# Patient Record
Sex: Female | Born: 1935 | Race: White | Hispanic: No | Smoking: Never smoker
Health system: Southern US, Community
[De-identification: ages and names within clinical notes are randomized; demographics above are authoritative.]

## PROBLEM LIST (undated history)

## (undated) DIAGNOSIS — I639 Cerebral infarction, unspecified: Secondary | ICD-10-CM

## (undated) DIAGNOSIS — K219 Gastro-esophageal reflux disease without esophagitis: Secondary | ICD-10-CM

## (undated) DIAGNOSIS — Z992 Dependence on renal dialysis: Secondary | ICD-10-CM

## (undated) DIAGNOSIS — I499 Cardiac arrhythmia, unspecified: Secondary | ICD-10-CM

## (undated) DIAGNOSIS — I739 Peripheral vascular disease, unspecified: Secondary | ICD-10-CM

## (undated) DIAGNOSIS — N289 Disorder of kidney and ureter, unspecified: Secondary | ICD-10-CM

## (undated) DIAGNOSIS — J90 Pleural effusion, not elsewhere classified: Secondary | ICD-10-CM

## (undated) DIAGNOSIS — I10 Essential (primary) hypertension: Secondary | ICD-10-CM

## (undated) DIAGNOSIS — E1140 Type 2 diabetes mellitus with diabetic neuropathy, unspecified: Secondary | ICD-10-CM

## (undated) DIAGNOSIS — IMO0001 Reserved for inherently not codable concepts without codable children: Secondary | ICD-10-CM

## (undated) DIAGNOSIS — M1990 Unspecified osteoarthritis, unspecified site: Secondary | ICD-10-CM

## (undated) DIAGNOSIS — I509 Heart failure, unspecified: Secondary | ICD-10-CM

## (undated) DIAGNOSIS — G2581 Restless legs syndrome: Secondary | ICD-10-CM

## (undated) DIAGNOSIS — N189 Chronic kidney disease, unspecified: Secondary | ICD-10-CM

## (undated) DIAGNOSIS — I4891 Unspecified atrial fibrillation: Secondary | ICD-10-CM

## (undated) DIAGNOSIS — F419 Anxiety disorder, unspecified: Secondary | ICD-10-CM

## (undated) DIAGNOSIS — E119 Type 2 diabetes mellitus without complications: Secondary | ICD-10-CM

## (undated) DIAGNOSIS — D649 Anemia, unspecified: Secondary | ICD-10-CM

## (undated) DIAGNOSIS — I272 Pulmonary hypertension, unspecified: Secondary | ICD-10-CM

## (undated) HISTORY — PX: OTHER SURGICAL HISTORY: SHX169

## (undated) HISTORY — PX: CHOLECYSTECTOMY: SHX55

## (undated) HISTORY — PX: JOINT REPLACEMENT: SHX530

## (undated) HISTORY — PX: DILATION AND CURETTAGE OF UTERUS: SHX78

## (undated) HISTORY — PX: CATARACT EXTRACTION, BILATERAL: SHX1313

## (undated) HISTORY — PX: THYROIDECTOMY, PARTIAL: SHX18

## (undated) HISTORY — PX: FISTULAGRAM (ARMC HX): HXRAD1277

## (undated) HISTORY — PX: TONSILLECTOMY: SUR1361

## (undated) HISTORY — PX: EYE SURGERY: SHX253

## (undated) HISTORY — PX: PARATHYROIDECTOMY: SHX19

## (undated) HISTORY — PX: CARDIAC CATHETERIZATION: SHX172

## (undated) SURGERY — Surgical Case
Anesthesia: *Unknown

---

## 2004-05-05 ENCOUNTER — Encounter: Payer: Self-pay | Admitting: Podiatry

## 2004-06-01 ENCOUNTER — Encounter: Payer: Self-pay | Admitting: Podiatry

## 2004-07-02 ENCOUNTER — Encounter: Payer: Self-pay | Admitting: Podiatry

## 2005-08-13 ENCOUNTER — Ambulatory Visit: Payer: Self-pay | Admitting: Family Medicine

## 2005-10-07 ENCOUNTER — Ambulatory Visit: Payer: Self-pay | Admitting: Unknown Physician Specialty

## 2005-10-13 ENCOUNTER — Emergency Department: Payer: Self-pay | Admitting: Emergency Medicine

## 2006-04-09 ENCOUNTER — Other Ambulatory Visit: Payer: Self-pay

## 2006-04-10 ENCOUNTER — Inpatient Hospital Stay: Payer: Self-pay | Admitting: Internal Medicine

## 2006-10-15 ENCOUNTER — Ambulatory Visit: Payer: Self-pay | Admitting: Ophthalmology

## 2007-05-12 ENCOUNTER — Ambulatory Visit: Payer: Self-pay | Admitting: Family Medicine

## 2007-09-20 ENCOUNTER — Encounter: Payer: Self-pay | Admitting: Internal Medicine

## 2007-09-20 ENCOUNTER — Ambulatory Visit: Payer: Self-pay | Admitting: Internal Medicine

## 2007-09-30 ENCOUNTER — Ambulatory Visit: Payer: Self-pay | Admitting: Internal Medicine

## 2007-10-07 ENCOUNTER — Encounter: Payer: Self-pay | Admitting: Internal Medicine

## 2007-10-31 ENCOUNTER — Encounter: Payer: Self-pay | Admitting: Internal Medicine

## 2007-11-30 ENCOUNTER — Encounter: Payer: Self-pay | Admitting: Internal Medicine

## 2008-06-12 ENCOUNTER — Ambulatory Visit: Payer: Self-pay | Admitting: Family Medicine

## 2008-12-26 ENCOUNTER — Ambulatory Visit: Payer: Self-pay | Admitting: Ophthalmology

## 2009-01-08 ENCOUNTER — Ambulatory Visit: Payer: Self-pay | Admitting: Ophthalmology

## 2009-02-08 ENCOUNTER — Ambulatory Visit: Payer: Self-pay | Admitting: Ophthalmology

## 2009-02-18 ENCOUNTER — Ambulatory Visit: Payer: Self-pay | Admitting: Ophthalmology

## 2009-04-02 ENCOUNTER — Ambulatory Visit: Payer: Self-pay | Admitting: Family Medicine

## 2009-08-08 ENCOUNTER — Ambulatory Visit: Payer: Self-pay | Admitting: Family Medicine

## 2010-09-03 ENCOUNTER — Ambulatory Visit: Payer: Self-pay | Admitting: Family Medicine

## 2011-01-09 ENCOUNTER — Encounter: Payer: Self-pay | Admitting: Cardiothoracic Surgery

## 2011-01-09 ENCOUNTER — Encounter: Payer: Self-pay | Admitting: Nurse Practitioner

## 2011-01-31 ENCOUNTER — Encounter: Payer: Self-pay | Admitting: Cardiothoracic Surgery

## 2011-01-31 ENCOUNTER — Encounter: Payer: Self-pay | Admitting: Nurse Practitioner

## 2011-03-02 ENCOUNTER — Encounter: Payer: Self-pay | Admitting: Nurse Practitioner

## 2011-03-02 ENCOUNTER — Encounter: Payer: Self-pay | Admitting: Cardiothoracic Surgery

## 2011-06-08 ENCOUNTER — Encounter: Payer: Self-pay | Admitting: Rheumatology

## 2011-09-08 ENCOUNTER — Ambulatory Visit: Payer: Self-pay | Admitting: Family Medicine

## 2011-10-02 ENCOUNTER — Ambulatory Visit: Payer: Self-pay | Admitting: Family Medicine

## 2011-12-25 ENCOUNTER — Encounter: Payer: Self-pay | Admitting: Nurse Practitioner

## 2011-12-25 ENCOUNTER — Encounter: Payer: Self-pay | Admitting: Cardiothoracic Surgery

## 2011-12-31 ENCOUNTER — Encounter: Payer: Self-pay | Admitting: Nurse Practitioner

## 2011-12-31 ENCOUNTER — Encounter: Payer: Self-pay | Admitting: Cardiothoracic Surgery

## 2012-01-19 ENCOUNTER — Ambulatory Visit: Payer: Self-pay | Admitting: Nurse Practitioner

## 2012-01-31 ENCOUNTER — Ambulatory Visit: Payer: Self-pay | Admitting: Nurse Practitioner

## 2012-09-09 ENCOUNTER — Ambulatory Visit: Payer: Self-pay | Admitting: Family Medicine

## 2013-04-04 ENCOUNTER — Ambulatory Visit: Payer: Self-pay | Admitting: Vascular Surgery

## 2013-04-13 ENCOUNTER — Ambulatory Visit: Payer: Self-pay | Admitting: Vascular Surgery

## 2013-06-19 ENCOUNTER — Inpatient Hospital Stay: Payer: Self-pay | Admitting: Internal Medicine

## 2013-06-19 LAB — BASIC METABOLIC PANEL
ANION GAP: 3 — AB (ref 7–16)
BUN: 108 mg/dL — ABNORMAL HIGH (ref 7–18)
CHLORIDE: 86 mmol/L — AB (ref 98–107)
Calcium, Total: 10.4 mg/dL — ABNORMAL HIGH (ref 8.5–10.1)
Co2: 42 mmol/L (ref 21–32)
Creatinine: 4.52 mg/dL — ABNORMAL HIGH (ref 0.60–1.30)
EGFR (African American): 10 — ABNORMAL LOW
EGFR (Non-African Amer.): 9 — ABNORMAL LOW
Glucose: 147 mg/dL — ABNORMAL HIGH (ref 65–99)
OSMOLALITY: 299 (ref 275–301)
Potassium: 3.4 mmol/L — ABNORMAL LOW (ref 3.5–5.1)
Sodium: 131 mmol/L — ABNORMAL LOW (ref 136–145)

## 2013-06-19 LAB — CBC WITH DIFFERENTIAL/PLATELET
Basophil #: 0.1 10*3/uL (ref 0.0–0.1)
Basophil %: 0.9 %
Eosinophil #: 0.1 10*3/uL (ref 0.0–0.7)
Eosinophil %: 1.7 %
HCT: 36.8 % (ref 35.0–47.0)
HGB: 12.1 g/dL (ref 12.0–16.0)
Lymphocyte #: 1.3 10*3/uL (ref 1.0–3.6)
Lymphocyte %: 16.7 %
MCH: 28.1 pg (ref 26.0–34.0)
MCHC: 32.7 g/dL (ref 32.0–36.0)
MCV: 86 fL (ref 80–100)
MONO ABS: 0.9 x10 3/mm (ref 0.2–0.9)
Monocyte %: 11.6 %
Neutrophil #: 5.4 10*3/uL (ref 1.4–6.5)
Neutrophil %: 69.1 %
PLATELETS: 223 10*3/uL (ref 150–440)
RBC: 4.29 10*6/uL (ref 3.80–5.20)
RDW: 18 % — ABNORMAL HIGH (ref 11.5–14.5)
WBC: 7.7 10*3/uL (ref 3.6–11.0)

## 2013-06-19 LAB — PROTIME-INR
INR: 1.1
Prothrombin Time: 13.9 secs (ref 11.5–14.7)

## 2013-06-19 LAB — HEPATIC FUNCTION PANEL A (ARMC)
ALBUMIN: 3 g/dL — AB (ref 3.4–5.0)
ALT: 19 U/L (ref 12–78)
AST: 27 U/L (ref 15–37)
Alkaline Phosphatase: 60 U/L
BILIRUBIN TOTAL: 0.4 mg/dL (ref 0.2–1.0)
Bilirubin, Direct: 0.2 mg/dL (ref 0.00–0.20)
TOTAL PROTEIN: 7.3 g/dL (ref 6.4–8.2)

## 2013-06-20 LAB — CBC WITH DIFFERENTIAL/PLATELET
BASOS ABS: 0.1 10*3/uL (ref 0.0–0.1)
Basophil %: 0.5 %
Eosinophil #: 0.2 10*3/uL (ref 0.0–0.7)
Eosinophil %: 2.1 %
HCT: 35.6 % (ref 35.0–47.0)
HGB: 11.6 g/dL — ABNORMAL LOW (ref 12.0–16.0)
LYMPHS PCT: 22.7 %
Lymphocyte #: 2.3 10*3/uL (ref 1.0–3.6)
MCH: 28.2 pg (ref 26.0–34.0)
MCHC: 32.7 g/dL (ref 32.0–36.0)
MCV: 86 fL (ref 80–100)
MONO ABS: 1.1 x10 3/mm — AB (ref 0.2–0.9)
Monocyte %: 10.6 %
Neutrophil #: 6.5 10*3/uL (ref 1.4–6.5)
Neutrophil %: 64.1 %
PLATELETS: 240 10*3/uL (ref 150–440)
RBC: 4.14 10*6/uL (ref 3.80–5.20)
RDW: 17.6 % — ABNORMAL HIGH (ref 11.5–14.5)
WBC: 10.1 10*3/uL (ref 3.6–11.0)

## 2013-06-20 LAB — BASIC METABOLIC PANEL
ANION GAP: 5 — AB (ref 7–16)
BUN: 112 mg/dL — AB (ref 7–18)
CREATININE: 4.78 mg/dL — AB (ref 0.60–1.30)
Calcium, Total: 10.2 mg/dL — ABNORMAL HIGH (ref 8.5–10.1)
Chloride: 86 mmol/L — ABNORMAL LOW (ref 98–107)
Co2: 40 mmol/L (ref 21–32)
EGFR (African American): 9 — ABNORMAL LOW
EGFR (Non-African Amer.): 8 — ABNORMAL LOW
Glucose: 47 mg/dL — ABNORMAL LOW (ref 65–99)
Osmolality: 295 (ref 275–301)
Potassium: 3.1 mmol/L — ABNORMAL LOW (ref 3.5–5.1)
Sodium: 131 mmol/L — ABNORMAL LOW (ref 136–145)

## 2013-06-20 LAB — LIPID PANEL
CHOLESTEROL: 119 mg/dL (ref 0–200)
HDL Cholesterol: 57 mg/dL (ref 40–60)
LDL CHOLESTEROL, CALC: 43 mg/dL (ref 0–100)
Triglycerides: 95 mg/dL (ref 0–200)
VLDL Cholesterol, Calc: 19 mg/dL (ref 5–40)

## 2013-06-20 LAB — PHOSPHORUS: PHOSPHORUS: 4.1 mg/dL (ref 2.5–4.9)

## 2013-06-20 LAB — TSH: Thyroid Stimulating Horm: 2.68 u[IU]/mL

## 2013-06-20 LAB — HEMOGLOBIN A1C: Hemoglobin A1C: 8.4 % — ABNORMAL HIGH (ref 4.2–6.3)

## 2013-06-20 LAB — MAGNESIUM: Magnesium: 3.1 mg/dL — ABNORMAL HIGH

## 2013-06-21 LAB — BASIC METABOLIC PANEL
Anion Gap: 6 — ABNORMAL LOW (ref 7–16)
BUN: 83 mg/dL — AB (ref 7–18)
CHLORIDE: 91 mmol/L — AB (ref 98–107)
CREATININE: 3.46 mg/dL — AB (ref 0.60–1.30)
Calcium, Total: 9.7 mg/dL (ref 8.5–10.1)
Co2: 34 mmol/L — ABNORMAL HIGH (ref 21–32)
EGFR (Non-African Amer.): 12 — ABNORMAL LOW
GFR CALC AF AMER: 14 — AB
Glucose: 135 mg/dL — ABNORMAL HIGH (ref 65–99)
Osmolality: 290 (ref 275–301)
Potassium: 3.1 mmol/L — ABNORMAL LOW (ref 3.5–5.1)
Sodium: 131 mmol/L — ABNORMAL LOW (ref 136–145)

## 2013-06-21 LAB — PHOSPHORUS: Phosphorus: 3.7 mg/dL (ref 2.5–4.9)

## 2013-06-21 LAB — PROTIME-INR
INR: 1.1
Prothrombin Time: 14.1 secs (ref 11.5–14.7)

## 2013-06-22 LAB — PROTIME-INR
INR: 1.1
PROTHROMBIN TIME: 14.3 s (ref 11.5–14.7)

## 2013-06-22 LAB — BASIC METABOLIC PANEL
Anion Gap: 3 — ABNORMAL LOW (ref 7–16)
BUN: 55 mg/dL — ABNORMAL HIGH (ref 7–18)
CALCIUM: 8.9 mg/dL (ref 8.5–10.1)
CREATININE: 2.78 mg/dL — AB (ref 0.60–1.30)
Chloride: 94 mmol/L — ABNORMAL LOW (ref 98–107)
Co2: 35 mmol/L — ABNORMAL HIGH (ref 21–32)
EGFR (African American): 18 — ABNORMAL LOW
EGFR (Non-African Amer.): 16 — ABNORMAL LOW
GLUCOSE: 146 mg/dL — AB (ref 65–99)
Osmolality: 282 (ref 275–301)
POTASSIUM: 3.3 mmol/L — AB (ref 3.5–5.1)
SODIUM: 132 mmol/L — AB (ref 136–145)

## 2013-06-22 LAB — PHOSPHORUS: Phosphorus: 3.3 mg/dL (ref 2.5–4.9)

## 2013-06-23 LAB — BASIC METABOLIC PANEL
ANION GAP: 5 — AB (ref 7–16)
BUN: 46 mg/dL — ABNORMAL HIGH (ref 7–18)
CHLORIDE: 95 mmol/L — AB (ref 98–107)
CREATININE: 2.73 mg/dL — AB (ref 0.60–1.30)
Calcium, Total: 8.9 mg/dL (ref 8.5–10.1)
Co2: 33 mmol/L — ABNORMAL HIGH (ref 21–32)
EGFR (African American): 19 — ABNORMAL LOW
EGFR (Non-African Amer.): 16 — ABNORMAL LOW
GLUCOSE: 116 mg/dL — AB (ref 65–99)
OSMOLALITY: 279 (ref 275–301)
Potassium: 3.7 mmol/L (ref 3.5–5.1)
Sodium: 133 mmol/L — ABNORMAL LOW (ref 136–145)

## 2013-06-23 LAB — PROTIME-INR
INR: 1.2
Prothrombin Time: 15.2 secs — ABNORMAL HIGH (ref 11.5–14.7)

## 2013-06-24 LAB — HEMOGLOBIN: HGB: 10.2 g/dL — AB (ref 12.0–16.0)

## 2013-06-24 LAB — PROTIME-INR
INR: 1.3
Prothrombin Time: 16.1 secs — ABNORMAL HIGH (ref 11.5–14.7)

## 2013-06-24 LAB — PHOSPHORUS: Phosphorus: 4.1 mg/dL (ref 2.5–4.9)

## 2013-06-25 LAB — PROTIME-INR
INR: 1.3
PROTHROMBIN TIME: 16.4 s — AB (ref 11.5–14.7)

## 2013-06-26 LAB — PHOSPHORUS: PHOSPHORUS: 4.2 mg/dL (ref 2.5–4.9)

## 2013-06-26 LAB — HEMOGLOBIN: HGB: 10.9 g/dL — AB (ref 12.0–16.0)

## 2013-06-26 LAB — PROTIME-INR
INR: 1.5
Prothrombin Time: 18.2 secs — ABNORMAL HIGH (ref 11.5–14.7)

## 2013-07-24 ENCOUNTER — Ambulatory Visit: Payer: Self-pay | Admitting: Vascular Surgery

## 2013-07-24 LAB — PROTIME-INR
INR: 1.2
Prothrombin Time: 14.6 secs (ref 11.5–14.7)

## 2013-07-24 LAB — POTASSIUM: POTASSIUM: 3.6 mmol/L (ref 3.5–5.1)

## 2013-12-01 ENCOUNTER — Ambulatory Visit: Payer: Self-pay | Admitting: Internal Medicine

## 2013-12-01 ENCOUNTER — Inpatient Hospital Stay: Payer: Self-pay | Admitting: Internal Medicine

## 2013-12-01 LAB — COMPREHENSIVE METABOLIC PANEL
ALBUMIN: 3.2 g/dL — AB (ref 3.4–5.0)
ALK PHOS: 101 U/L
AST: 30 U/L (ref 15–37)
Anion Gap: 9 (ref 7–16)
BILIRUBIN TOTAL: 0.5 mg/dL (ref 0.2–1.0)
BUN: 22 mg/dL — ABNORMAL HIGH (ref 7–18)
CHLORIDE: 100 mmol/L (ref 98–107)
Calcium, Total: 9 mg/dL (ref 8.5–10.1)
Co2: 27 mmol/L (ref 21–32)
Creatinine: 1.76 mg/dL — ABNORMAL HIGH (ref 0.60–1.30)
EGFR (Non-African Amer.): 27 — ABNORMAL LOW
GFR CALC AF AMER: 32 — AB
GLUCOSE: 122 mg/dL — AB (ref 65–99)
Osmolality: 277 (ref 275–301)
POTASSIUM: 3.1 mmol/L — AB (ref 3.5–5.1)
SGPT (ALT): 18 U/L (ref 12–78)
Sodium: 136 mmol/L (ref 136–145)
TOTAL PROTEIN: 7.4 g/dL (ref 6.4–8.2)

## 2013-12-01 LAB — CBC
HCT: 42.5 % (ref 35.0–47.0)
HGB: 13.7 g/dL (ref 12.0–16.0)
MCH: 30.1 pg (ref 26.0–34.0)
MCHC: 32.3 g/dL (ref 32.0–36.0)
MCV: 93 fL (ref 80–100)
Platelet: 247 10*3/uL (ref 150–440)
RBC: 4.56 10*6/uL (ref 3.80–5.20)
RDW: 17.4 % — ABNORMAL HIGH (ref 11.5–14.5)
WBC: 9.1 10*3/uL (ref 3.6–11.0)

## 2013-12-01 LAB — TSH: THYROID STIMULATING HORM: 2.13 u[IU]/mL

## 2013-12-01 LAB — CK TOTAL AND CKMB (NOT AT ARMC)
CK, Total: 46 U/L
CK-MB: 2 ng/mL (ref 0.5–3.6)

## 2013-12-01 LAB — HEMOGLOBIN A1C: Hemoglobin A1C: 6.5 % — ABNORMAL HIGH (ref 4.2–6.3)

## 2013-12-01 LAB — TROPONIN I
Troponin-I: 0.03 ng/mL
Troponin-I: 0.02 ng/mL

## 2013-12-01 LAB — PROTIME-INR
INR: 1.6
Prothrombin Time: 19.1 secs — ABNORMAL HIGH (ref 11.5–14.7)

## 2013-12-02 LAB — RENAL FUNCTION PANEL
ALBUMIN: 3.1 g/dL — AB (ref 3.4–5.0)
Anion Gap: 9 (ref 7–16)
BUN: 18 mg/dL (ref 7–18)
CALCIUM: 9.1 mg/dL (ref 8.5–10.1)
CHLORIDE: 99 mmol/L (ref 98–107)
Co2: 29 mmol/L (ref 21–32)
Creatinine: 1.67 mg/dL — ABNORMAL HIGH (ref 0.60–1.30)
GFR CALC AF AMER: 34 — AB
GFR CALC NON AF AMER: 29 — AB
Glucose: 183 mg/dL — ABNORMAL HIGH (ref 65–99)
Osmolality: 280 (ref 275–301)
Phosphorus: 2.8 mg/dL (ref 2.5–4.9)
Potassium: 3.3 mmol/L — ABNORMAL LOW (ref 3.5–5.1)
Sodium: 137 mmol/L (ref 136–145)

## 2013-12-02 LAB — COMPREHENSIVE METABOLIC PANEL
ALK PHOS: 97 U/L
ANION GAP: 9 (ref 7–16)
Albumin: 3.2 g/dL — ABNORMAL LOW (ref 3.4–5.0)
BUN: 15 mg/dL (ref 7–18)
Bilirubin,Total: 0.6 mg/dL (ref 0.2–1.0)
Calcium, Total: 8.8 mg/dL (ref 8.5–10.1)
Chloride: 98 mmol/L (ref 98–107)
Co2: 31 mmol/L (ref 21–32)
Creatinine: 1.4 mg/dL — ABNORMAL HIGH (ref 0.60–1.30)
GFR CALC AF AMER: 42 — AB
GFR CALC NON AF AMER: 36 — AB
Glucose: 166 mg/dL — ABNORMAL HIGH (ref 65–99)
Osmolality: 280 (ref 275–301)
POTASSIUM: 3.1 mmol/L — AB (ref 3.5–5.1)
SGOT(AST): 29 U/L (ref 15–37)
SGPT (ALT): 17 U/L (ref 12–78)
Sodium: 138 mmol/L (ref 136–145)
Total Protein: 7 g/dL (ref 6.4–8.2)

## 2013-12-02 LAB — CBC WITH DIFFERENTIAL/PLATELET
BASOS PCT: 0.6 %
Basophil #: 0.1 10*3/uL (ref 0.0–0.1)
Eosinophil #: 0.1 10*3/uL (ref 0.0–0.7)
Eosinophil %: 1.5 %
HCT: 40.6 % (ref 35.0–47.0)
HGB: 13.5 g/dL (ref 12.0–16.0)
Lymphocyte #: 0.8 10*3/uL — ABNORMAL LOW (ref 1.0–3.6)
Lymphocyte %: 8.4 %
MCH: 30.8 pg (ref 26.0–34.0)
MCHC: 33.3 g/dL (ref 32.0–36.0)
MCV: 92 fL (ref 80–100)
Monocyte #: 0.9 x10 3/mm (ref 0.2–0.9)
Monocyte %: 9.4 %
NEUTROS ABS: 7.8 10*3/uL — AB (ref 1.4–6.5)
NEUTROS PCT: 80.1 %
PLATELETS: 212 10*3/uL (ref 150–440)
RBC: 4.4 10*6/uL (ref 3.80–5.20)
RDW: 17.3 % — ABNORMAL HIGH (ref 11.5–14.5)
WBC: 9.8 10*3/uL (ref 3.6–11.0)

## 2013-12-02 LAB — APTT: Activated PTT: 40.4 secs — ABNORMAL HIGH (ref 23.6–35.9)

## 2013-12-02 LAB — PROTIME-INR
INR: 1.7
PROTHROMBIN TIME: 19.7 s — AB (ref 11.5–14.7)

## 2013-12-02 LAB — TROPONIN I: TROPONIN-I: 0.03 ng/mL

## 2013-12-02 LAB — HEPARIN LEVEL (UNFRACTIONATED): Anti-Xa(Unfractionated): 0.14 IU/mL — ABNORMAL LOW (ref 0.30–0.70)

## 2013-12-03 LAB — CBC WITH DIFFERENTIAL/PLATELET
Basophil #: 0.1 10*3/uL (ref 0.0–0.1)
Basophil %: 0.7 %
Eosinophil #: 0.2 10*3/uL (ref 0.0–0.7)
Eosinophil %: 2 %
HCT: 42.7 % (ref 35.0–47.0)
HGB: 14.2 g/dL (ref 12.0–16.0)
Lymphocyte #: 1.1 10*3/uL (ref 1.0–3.6)
Lymphocyte %: 14.5 %
MCH: 31.2 pg (ref 26.0–34.0)
MCHC: 33.3 g/dL (ref 32.0–36.0)
MCV: 94 fL (ref 80–100)
MONO ABS: 0.9 x10 3/mm (ref 0.2–0.9)
Monocyte %: 11.1 %
NEUTROS ABS: 5.6 10*3/uL (ref 1.4–6.5)
Neutrophil %: 71.7 %
PLATELETS: 214 10*3/uL (ref 150–440)
RBC: 4.56 10*6/uL (ref 3.80–5.20)
RDW: 17.1 % — ABNORMAL HIGH (ref 11.5–14.5)
WBC: 7.9 10*3/uL (ref 3.6–11.0)

## 2013-12-03 LAB — PROTIME-INR
INR: 1.7
Prothrombin Time: 19.2 secs — ABNORMAL HIGH (ref 11.5–14.7)

## 2013-12-03 LAB — HEPARIN LEVEL (UNFRACTIONATED): ANTI-XA(UNFRACTIONATED): 0.35 [IU]/mL (ref 0.30–0.70)

## 2014-01-22 ENCOUNTER — Ambulatory Visit: Payer: Self-pay | Admitting: Vascular Surgery

## 2014-01-22 LAB — POTASSIUM: Potassium: 3.5 mmol/L (ref 3.5–5.1)

## 2014-01-22 LAB — PROTIME-INR
INR: 1.5
PROTHROMBIN TIME: 17.5 s — AB (ref 11.5–14.7)

## 2014-07-04 ENCOUNTER — Ambulatory Visit: Payer: Self-pay | Admitting: Vascular Surgery

## 2014-07-04 LAB — BASIC METABOLIC PANEL WITH GFR
Anion Gap: 8
BUN: 44 mg/dL — ABNORMAL HIGH
Calcium, Total: 8.9 mg/dL
Chloride: 100 mmol/L
Co2: 29 mmol/L
Creatinine: 2.72 mg/dL — ABNORMAL HIGH
EGFR (African American): 22 — ABNORMAL LOW
EGFR (Non-African Amer.): 18 — ABNORMAL LOW
Glucose: 76 mg/dL
Osmolality: 284
Potassium: 3.3 mmol/L — ABNORMAL LOW
Sodium: 137 mmol/L

## 2014-07-04 LAB — PROTIME-INR
INR: 1.8
Prothrombin Time: 21.2 s — ABNORMAL HIGH

## 2014-09-21 NOTE — Op Note (Signed)
PATIENT NAME:  [name], [name] MR#:  [id number] DATE OF BIRTH:  [birth year]  DATE OF PROCEDURE:  04/13/2013  PREOPERATIVE DIAGNOSES: 1.  Chronic kidney disease nearing dialysis dependence.  2.  Hypertension.  3.  Diabetes.   POSTOPERATIVE DIAGNOSES: 1.  Chronic kidney disease nearing dialysis dependence.  2.  Hypertension.  3.  Diabetes.  PROCEDURE:  Right brachiocephalic AV fistula creation.   SURGEON:  Annice NeedyJason S. Dew, M.D.   ANESTHESIA:  General.   ESTIMATED BLOOD LOSS:  25 mL.   INDICATION FOR PROCEDURE:  A 81 year old white female with chronic kidney disease.  This has progressed and she is now nearing dialysis dependence.  We are asked to see her for permanent dialysis access.  She had no usable superficial vein in her left upper extremity.  Her right cephalic vein in the upper arm was adequate for fistula creation by duplex and she is brought in for fistula creation at this location.  Risks and benefits were discussed.  Informed consent is obtained.   DESCRIPTION OF PROCEDURE:  The patient is brought to the operative suite and after an adequate level of general anesthesia is obtained, the right upper extremity is sterilely prepped and draped and a sterile surgical field was created.  A curvilinear incision was created at the antecubital fossa.  We dissected down finding a moderate-sized and patent cephalic vein that appeared usable for fistula creation.  This dissected out, marked for orientation, ligated distally and controlled with a bulldog clamp.  The artery was dissected out and encircled with vessel loops proximally and distally.  The patient was systemically heparinized with 3000 units of intravenous heparin.  An anterior wall arteriotomy was created with an 11 blade and extended with Potts scissors and then the vein was cut and beveled to an appropriate length to match the arteriotomy.  The vein was flushed.  I was able to pass a 4 dilator without difficulty through the vein and I  then created an anastomosis with a running 6-0 Prolene suture in the usual fashion, two 6-0 Prolene patch sutures were used for hemostasis and hemostasis was complete.  The vessel had been flushed and de-aired prior to releasing control.  On release, there was some mild vasospasm treated with topical papaverine.  After this, there was a nice thrill within the AV fistula and a palpable pulse in the brachial artery distal to the anastomosis.  At this point I elected to terminate the procedure.  Surgicel was placed.  The wound had been irrigated.  The wound was closed with 3-0 Vicryl and a 4-0 Monocryl.  Dermabond was placed as a dressing.  The patient tolerated the procedure well and was taken to the recovery room in stable condition.    ____________________________ Annice NeedyJason S. Dew, MD jsd:ea D: 04/13/2013 14:07:10 ET T: 04/13/2013 23:11:02 ET JOB#: [id number]  cc: Annice NeedyJason S. Dew, MD, <Dictator> Annice NeedyJASON S DEW MD ELECTRONICALLY SIGNED 04/17/2013 12:17

## 2014-09-22 NOTE — H&P (Signed)
PATIENT NAME:  [name], [name] MR#:  [id number] DATE OF BIRTH:  [birth year]  DATE OF ADMISSION:  06/19/2013  REFERRING PHYSICIAN:  Dr. Wynelle Link.   PRIMARY CARE PHYSICIAN:  Dr. Gavin Potters.   CHIEF COMPLAINT:  Uremia, shakes, tremors.   HISTORY OF PRESENT ILLNESS:  The patient is a pleasant 81 year old female with CKD on high-dose Lasix and metolazone, diabetes, hypertension, CHF diastolic in nature. She was asked to be admitted by Dr. Wynelle Link for having increased weakness, lethargy, and uremic symptoms with tremors. She is itchy and has no energy. Of note, her BUN is getting close to 100, and it was lower previously with a creatinine of close to 4. Of note, she has an AV fistula in place from November. She has no chest pain or shortness of breath.   PAST MEDICAL HISTORY:  History of chronic afib/flutter on warfarin, hypertension, gout, diabetes with neuropathy, hyperlipidemia, depression overactive bladder, incontinence, anxiety, diastolic CHF, parathyroidectomy in 2007, cataracts, osteoarthritis, history of stroke with right visual field blindness, lung nodules, history of a right arm AV fistula.   SOCIAL HISTORY:  Lives by herself. Poor vision. No tobacco, but had extensive secondhand tobacco from her husband. No alcohol.   FAMILY HISTORY:  Kidney disease, heart disease, and cancer.   ALLERGIES:  OXYCODONE and QUININE.   OUTPATIENT MEDICATIONS:  Albuterol 90 mcg 1 puff every 4 hours as needed, allopurinol 100 mg daily, aspirin 81 mg daily, calcium carbonate 600 mg once a day, carvedilol 6.25 mg 2 times a day, docusate sodium 2 times a day 100 mg dose, furosemide 80 mg 2 times a day, gabapentin 300 mg daily, sliding scale insulin, hydralazine 50 mg 2 times a day, Lantus 21 units daily, metolazone 5 mg in the morning with Lasix, oxybutynin 10 mg daily, paroxetine 40 mg daily, Actos 30 mg daily, warfarin 3 mg half a tab once a day.   REVIEW OF SYSTEMS: CONSTITUTIONAL:  Denies fevers or weight gain or  loss, but has fatigue, weakness, and poor appetite.  EYES:  Decreased right vision after stroke.  ENT:  No tinnitus or hearing loss.  RESPIRATORY:  No cough, wheezing, shortness of breath.  CARDIOVASCULAR:  No chest pain. Has a history of afib and flutter with diastolic CHF with chronic effusions.  ABDOMEN AND GASTROINTESTINAL:  Positive for constipation. No diarrhea. No abdominal pain, hematemesis, or black or tarry stools.  GENITOURINARY:  Denies dysuria or hematuria. She still does pee.  ENDOCRINE:  Has a history of parathyroid issues.  SKIN:  No obvious rashes.  NEUROLOGIC:  No focal weakness. Has global weakness.  PSYCHIATRIC:  Has depression.   PHYSICAL EXAMINATION:  VITAL SIGNS:  Temperature 97.8, pulse rate 78, respiratory 16, blood pressure 129/72, O2 sat 94% on room air.  GENERAL:  The patient is an obese female lying in bed. No obvious distress.  HEENT:  Normocephalic, atraumatic. Pupils appear to be equal. Dry mucous membranes.  NECK:  Supple. No thyroid tenderness. No cervical lymphadenopathy.  CARDIOVASCULAR:  S1, S2. Regular to me.  LUNGS:  Decreased breath sounds at the bases. Good air entry at the upper lobes, No significant wheezing.  ABDOMEN:  Soft, nontender, nondistended. Positive bowel sounds in all quadrants. No organomegaly could be appreciated.  EXTREMITIES:  The patient has 2+ pitting edema to mid shins and some chronic venous stasis changes.  SKIN:  Otherwise, no other rashes or lesions. NEUROLOGIC:  Cranial nerves II through XII grossly intact. Strength is 5/5 in all extremities. Do not appreciate any  significant tremors.  PSYCHIATRIC:  Awake, alert, and oriented x3.   LABORATORIES:  No recent labs here, but from the office from several days ago: White count of 9.1, hemoglobin of 12.1. BUN of 72, creatinine is 3.4. Both of those might be older; as per Dr. Wynelle LinkKolluru, the BUN is closer to 100 or higher with creatinine close to 4. Last INR was 1.3 from late last week  per daughter, who is in the room,   ASSESSMENT AND PLAN:  We have a 81 year old with chronic kidney disease, diabetes, hypertension, chronic diastolic congestive heart failure, who was asked to be admitted for initiation of dialysis for increased uremia or worsening renal failure. The patient will be admitted at this point. We will obtain baseline labs including CBC, BMP, hepatic function, and INR. I did discuss the case with vascular to see if they could place a line for dialysis, but he recommended starting a trial with the fistula. I have also discussed the case with nephrology. We will see if she needs a dialysis catheter or the fistula could be used. I hear a good bruit on the fistula. I will continue the blood pressure medications, continue the Lantus, hold Actos at this point. I will continue the furosemide, hold the Zaroxolyn at this point, continue the aspirin. For deep vein thrombosis prophylaxis, she is on Coumadin. I will check an INR at this time. I will check a baseline x-ray of the chest.   TOTAL TIME SPENT:  45 minutes.    ____________________________ Krystal EatonShayiq Dairon Procter, MD sa:ms D: 06/19/2013 16:46:51 ET T: 06/19/2013 18:02:53 ET JOB#: [id number]  cc: Krystal EatonShayiq Brailee Riede, MD, <Dictator> Krystal EatonSHAYIQ Kraig Genis MD ELECTRONICALLY SIGNED 07/15/2013 10:53

## 2014-09-22 NOTE — Discharge Summary (Signed)
PATIENT NAME:  [name], [name] MR#:  [id number] DATE OF BIRTH:  August 07, 1933  DATE OF ADMISSION:  06/19/2013 DATE OF DISCHARGE:  06/26/2013  ADMISSION DIAGNOSIS: Acute on chronic renal failure.   DISCHARGE DIAGNOSES: 1. End-stage renal disease now on hemodialysis.  2. Hypertension.  3. History of atrial fibrillation and flutter.  4. Chronic diastolic heart failure. 5. Diabetes.  6. Hyperlipidemia.   CONSULTATIONS: Dr. Cherylann RatelLateef.   LABORATORIES AT DISCHARGE: INR is 1.5.   HOSPITAL COURSE: This is a very pleasant 81 year old female with a history of chronic kidney disease, who presented with uremia, shakes and tremors. For further details, please refer to the H and P.  1. Uremia with end-stage renal disease. The patient has chronic renal failure. She had uremic symptoms on admission. She was transitioned to dialysis. She is now end-stage renal dialysis. She has had more than three dialysis here and is stable for discharge to rehab, and she will continue her Monday, Wednesday, Friday dialysis.  2. Hypertension. The patient's blood pressure is stable.  3. History of atrial flutter and atrial fibrillation. The patient is on Cardizem 300 and Coreg. Her heart rates are controlled. She is also on Coumadin. Her INR is 1.5 mg at discharge. She will need to continue on her Coumadin with weekly Coumadin checks.  3. Chronic diastolic heart failure.She did not appear to be in congestive heart failure. She is on Lasix and a beta blocker.  4. Diabetes. The patient will continue on her outpatient medications.  5. Hyperlipidemia, on Pravachol.   DISCHARGE MEDICATIONS: 1. Multivitamin 1 tablet daily.  2. Fish oil 1200 mg daily.  3. Calcium plus vitamin D 1 tablet daily.  4. Pravastatin 40 mg at bedtime.  5. Paxil 40 mg daily.  6. Aspirin 81 mg daily.  7. Oxybutynin 10 mg daily.  8. Diltiazem 240 mg daily.  9. Coreg 6.25 b.i.d.  10. Allopurinol 50 mg daily.  11. Senokot 2 tablets b.i.d.  12.  Acetaminophen 650 2 tablets b.i.d.  13. Lantus 21 units in the morning.  14. Humalog sliding scale.  15. Gabapentin 300 mg at bedtime.  16. Hydralazine 50 mg b.i.d.  17. Coumadin 1 mg every other day, that is Monday, Wednesday and Friday and 2 mg on the other days.  18. Sliding scale insulin as directed on discharge instructions. 19.  Lasix 80 mg b.i.d.   DISCHARGE DIET: Renal diet.   DISCHARGE ACTIVITY: As tolerated.   DISCHARGE FOLLOW-UP: The patient will follow up with Dr. Wynelle LinkKolluru in one week, as well as Dr. Rolm GalaHeidi Grandis. The patient needs weekly INR checks keeping INR between 2 and 3.  TIME SPENT: 37 minutes.   ____________________________ Janyth ContesSital P. Juliene PinaMody, MD spm:sg D: 06/26/2013 12:34:24 ET T: 06/26/2013 12:59:52 ET JOB#: [id number]  cc: Laquetta Racey P. Juliene PinaMody, MD, <Dictator> Letitia CaulHeidi M. Grandis, MD Laverda SorensonSarath C. Kolluru, MD  Janyth ContesSITAL P Kenna Kirn MD ELECTRONICALLY SIGNED 06/26/2013 14:06

## 2014-09-22 NOTE — Op Note (Signed)
PATIENT NAME:  [name], Ceirra Y MR#:  [id number] DATE OF BIRTH:  [birth year]  DATE OF PROCEDURE:  07/24/2013  PREOPERATIVE DIAGNOSES: 1.  End-stage renal disease.  2.  Poorly functioning right arm arteriovenous fistula.  3.  Hypertension.  4.  Diabetes.   POSTOPERATIVE DIAGNOSES:  1.  End-stage renal disease.  2.  Poorly functioning right arm arteriovenous fistula.  3.  Hypertension.  4.  Diabetes.   PROCEDURES: 1.  Ultrasound guidance for vascular access to right brachiocephalic arteriovenous fistula.  2.  Right upper extremity fistulogram and central venogram.  3.  Percutaneous transluminal angioplasty of mid upper arm cephalic vein with 7 and 8 mm diameter angioplasty balloons.   SURGEON: Annice NeedyJason S. Dew, M.D.   ANESTHESIA: Local with moderate conscious sedation.   ESTIMATED BLOOD LOSS: Minimal.   INDICATION FOR PROCEDURE: This is a 81 year old female with end-stage renal disease. Her AV fistula has developed significant bruising and diminished function on dialysis. We are asked to evaluate this. Risks and benefits were discussed and informed consent is obtained.   DESCRIPTION OF PROCEDURE: The patient is brought to the vascular suite. The right extremity was sterilely prepped and draped and a sterile surgical field was created. Ultrasound was used to visualize the fistula. This was accessed just a couple centimeters beyond the anastomosis, in the cephalic vein, with a micropuncture needle. Micropuncture wire and sheath were then placed and a permanent image was recorded. I upsized to a 6-French sheath and imaging was performed. This showed a high-grade stenosis in the mid upper arm cephalic vein that would be estimated in the 80% to 85% range. There were significant collaterals around this area. The remainder of the fistula was patent with mild stenosis at the cephalic vein, subclavian vein junction of less than 30%. The central venous circulation was patent. I then crossed the lesion  without difficulty with a Magic torque wire. I gave the patient 3,000 units of intravenous heparin and performed percutaneous transluminal angioplasty initially with a 7 mm diameter angioplasty balloon. With the balloon inflated, imaging was performed which showed the anastomosis which was widely patent. Following angioplasty with a 7 mm balloon there was still what appeared to be stenosis still in the 50% range. I took an 8 mm diameter angioplasty balloon to 16 atmospheres and completion angiogram following this showed markedly improved flow with about a 20 to 25% residual stenosis. At this point, I elected to terminate the procedure. The sheath was removed. A 4-0 Monocryl pursestring suture was placed. Pressure was held. Sterile dressing was placed. The patient tolerated the procedure well and was taken to the recovery room in stable condition.  ____________________________ Annice NeedyJason S. Dew, MD jsd:sb D: 07/24/2013 14:55:50 ET T: 07/24/2013 15:12:24 ET JOB#: [id number]  cc: Annice NeedyJason S. Dew, MD, <Dictator> Letitia CaulHeidi M. Grandis, MD Munsoor Lizabeth LeydenN. Lateef, MD / Mosetta PigeonHarmeet Singh, MD / Lamont DowdySarath Kolluru, MD Annice NeedyJASON S DEW MD ELECTRONICALLY SIGNED 07/27/2013 12:02

## 2014-09-22 NOTE — H&P (Signed)
PATIENT NAME:  [name], [name] MR#:  [id number] DATE OF BIRTH:  May 16, 1934  DATE OF ADMISSION:  12/01/2013  PRIMARY CARE PHYSICIAN: Letitia Caul, MD  HISTORY OF PRESENT ILLNESS: The patient  is a 81 year old, African American female with past medical history significant for history of end-stage renal disease, initiation January 2015 via right AV fistula, who presents to the hospital with complaints of significant shortness of breath. According to the patient she has been having shortness of breath over the past 1 week, worsening over the past 2 days. Her O2 saturations were 88 to 90% on room air. She was noted to have bilateral pleural effusions, more on the left side, on chest x-ray in the Emergency Room. Apparently, she has been losing weight and  her dry weight is adjusted all the time. Now, her dry weight is 84 kg. She had hemodialysis today, but she was so tired and she was so short of breath that as soon as she came to home, she went to her room and hooked up to oxygen. Usually she takes oxygen just at night, but yesterday she had to use it all day long. Today in the morning she was seen by her daughter who felt that patient was struggling and brought her to the Emergency Room for further evaluation. In the Emergency Room as mentioned above she had an x-ray, which revealed bilateral pleural effusions and hospitalist services were contacted for admission.   PAST MEDICAL HISTORY:  1. Significant for history of end-stage renal disease, initiated in January 2015, now on Tuesdays, Thursdays, Saturdays hemodialysis.  2. Hypertension.  3. Atrial fibrillation.  4.   Chronic diastolic CHF. 5. Diabetes mellitus.  6. Hyperlipidemia.  7. Chronic respiratory failure, on 2 liters of oxygen through nasal cannula at night.  8. History of gout.  9. Diabetes with neuropathy.  10. Depression.  11. Overactive bladder.  12. Incontinence.  13. Anxiety.  14. Status post parathyroidectomy in  2007. 15. Cataracts.  16. Osteoarthritis.  17. History of stroke with right-sided visual field blindness.  18. Lung nodules.  19. History of right arm AV fistula.   SOCIAL HISTORY: Lives by herself, poor vision. No tobacco. Has extensive secondhand tobacco from her husband. No alcohol abuse per medical history.   FAMILY HISTORY: Kidney disease, heart disease as well as cancer.  ALLERGIES: OXYCODONE AS WELL AS QUININE  OUTPATIENT MEDICATIONS: According to medical records, the patient is on;  1. Albuterol 1 puff every 4 hours as needed.  2. Allopurinol 100 mg p.o. daily.  3. Aspirin 81 mg p.o. daily.  4. Calcium carbonate  600 mg once daily.  5. Carvedilol 3.125 mg p.o. twice daily.  6. Coumadin 2 mg p.o. daily.  7. Docusate sodium 100 mg p.o. twice daily.  8. Fish oil 200 mg p.o. once daily.  9. Furosemide 20 mg p.o. twice daily.  10. Gabapentin 300  mg p.o. twice daily.  11. Humalog KwikPen sliding scale.  12. Levemir 21 units subcutaneously in the morning.  13. Magnesium hydroxide 400 mg p.o. once daily.  14. Oxybutynin extended-release 10 mg p.o. daily.  15. Paroxetine 40 mg p.o. daily.  16. Pioglitazone 15 mg p.o. daily.    REVIEW OF SYSTEMS:  CONSTITUTIONAL: Positive for weight loss from 265 to 180s in 1-1/2 years. Admits of good p.o. intake; however, still losing weight. Admits of significant weakness, which is chronic.  Denies any significant fevers or chills, fatigue, weight gain EYES: Also, intermittent headache, bifocal glasses.  RESPIRATORY:  Some cough but no significant phlegm production. Shortness of breath, worsening over the past 1 week. Intermittent palpitations in the chest as well as feeling presyncopal.  Denies any wheezes, asthma, chronic obstructive pulmonary disease.   GASTROINTESTINAL: Frequent constipation. No diarrhea, rectal bleeding, change in bowel habits, admits of constipation.  GENITOURINARY: Also urinary incontinence, which seems to be improved now  since she had pacer placed for bladder in her hip area. Does admit having some dysuria symptoms. Denies any hematuria, frequency.  ENT: Denies blurred vision, double vision, glaucoma, and cataracts. No allergies, sinus pain, dentures, difficulty swallowing. She had sleep study in the past, which was normal. CARDIOVASCULAR: Denies chest pains, orthopnea, edema. Admits of palpitations, as well as and feeling presyncopal earlier.  ENDOCRINOLOGY: Denies any polydipsia, nocturia, thyroid problems, or cold intolerance or thirst.  HEMATOLOGIC: Denies anemia, easy bruising, bleeding. SKIN: Denies any acne, rashes, lesions or change in moles.  MUSCULOSKELETAL: Denies arthritis, cramps, swelling.  NEUROLOGIC: There is no numbness, epilepsy or tremor.  PSYCHIATRIC: Denies anxiety, insomnia.  PHYSICAL EXAMINATION: On arrival to the hospital: VITAL SIGNS: Temperature 97.8, pulse is 73, respirations 20, blood pressure 135/90. Oxygen saturation was 88% to 90% on room air.  GENERAL: This is well-developed, well-nourished African American female in no significant distress, sitting on the stretcher. At present as she has oxygen via nasal cannula. HEENT: Her pupils are equal and reactive to light.  Extraocular muscles intact, no icterus or conjunctivitis. Has normal hearing. No pharyngeal erythema. Mucosa is moist. NECK: No masses. Supple, nontender. Thyroid is not enlarged. No adenopathy. No JVD or carotid bruits bilaterally. Full range of motion.  LUNGS: Clear to auscultation, a few crackles were heard at bases. No rhonchi or wheezing. No labored inspirations, increased effort, dullness to percussion or overt respiratory distress.   CARDIOVASCULAR: S1, S2 appreciated. No murmurs or gallops or rubs. The patient has irregularly irregular heart rhythm, no lateralization on palpation.   CHEST: Nontender to palpation.  EXTREMITIES:   trace lower extremity edema. No calf tenderness or cyanosis was noted.  ABDOMEN: Soft,  nontender. Bowel sounds are present. No hepatosplenomegaly or masses were noted.  RECTAL: Deferred.  MUSCLE STRENGTH: Able to move all extremities. No cyanosis, degenerative joint disease or kyphosis. Gait was not tested.  SKIN: No  rashes, lesions, erythema, nodularity or induration. It was warm and dry to palpation.  LYMPHATIC: No adenopathy in the cervical region.  NEUROLOGICAL: Cranial nerves grossly intact. Sensory intact. No dysarthria or aphasia. The patient is alert and oriented to person and place, cooperative. Memory is good.  No significant confusion, agitation or depression noted.   EKG showed atrial fibrillation at 63 beats per minute, left axis deviation and right bundle branch block. In comparison to prior EKG, no significant change since prior EKG done November 2014.   LABORATORY STUDIES:  BMP: Glucose 122, BUN and creatinine 22 and 1.76, potassium 3.1, otherwise BMP unremarkable. Albumin level of 3.2. Cardiac enzymes first set negative. CBC: White blood cell count 9.1, hemoglobin 13.7, platelet count 247,000. Coagulation panel: Pro time was 19.1, INR 1.6.   RADIOLOGIC STUDIES: Chest x-ray PA and lateral, December 01, 2013,  revealed bilateral effusions with associated atelectasis, worse on the left than on the right. Findings are progressive since January 2015.  ASSESSMENT AND PLAN: 1. Bilateral pleural effusions, possibly related to end-stage renal disease, fluid overload, congestive heart failure and possibly even poorly-adjusted dry weight. I did admit the patient to the medical floor. Get thoracentesis if no effect with  lasix intravenously.  2. End-stage renal disease. Nephrology consultation will be obtained.  3. Weight loss. Get TSH, hemoglobin A1c and follow p.o. intake.  4. Hypokalemia: Supplement p.o.  5. Atrial fibrillation. Had supratherapeutic Coumadin level. We will advance Coumadin dosing. Will be following patient's pro time daily.   TIME SPENT 50 minutes    ____________________________ Katharina Caperima Alicia Ackert, MD rv:dw D: 12/01/2013 14:51:55 ET T: 12/01/2013 17:21:45 ET JOB#: [id number]  cc: Katharina Caperima Rishard Delange, MD, <Dictator> Letitia CaulHeidi M. Grandis, MD Lameeka Schleifer MD ELECTRONICALLY SIGNED 12/21/2013 15:25

## 2014-09-22 NOTE — Discharge Summary (Signed)
PATIENT NAME:  [name], Blue Y MR#:  [id number] DATE OF BIRTH:  1933-08-03  DATE OF ADMISSION:  12/01/2013 DATE OF DISCHARGE:  12/03/2013  PRIMARY CARE PHYSICIAN: Dr. Rolm GalaHeidi Grandis.  FINAL DIAGNOSES:  1. Acute respiratory failure, which resolved.  2. Acute diastolic congestive heart failure with severe mitral regurgitation, pleural effusion, left greater than right.  3. End-stage renal disease on hemodialysis.  4. Atrial fibrillation.  5. Weight loss.  6. Hypertension.   MEDICATIONS ON DISCHARGE: Include fish oil 1200 mg daily, Paxil 40 mg at bedtime, oxybutynin 10 mg extended-release daily, aspirin 81 mg daily, Colace 100 mg 2 tablets twice a day, gabapentin 300 mg 1 capsule twice a day, Ventolin HFA 2 puffs 4 times a day as needed for wheezing, allopurinol 100 mg half tablet daily, furosemide 80 mg twice a day, acetaminophen extended-release 650 mg 2 tablets twice a day, Dialyvite with zinc 1 tablet daily, Cardizem CD 240 mg extended-release daily, warfarin 2 mg 1 tablet in the evening on Monday, Wednesday, and Friday, 1-1/2 tablets Tuesday, Thursday, Saturday, and Sunday. Tonight I want her to give 5 mg and tomorrow 3 mg, then on the regular schedule after that, Levemir FlexPen 10 units subcutaneous injection at bedtime.  OXYGEN: None.   DIET: Renal diet, regular consistency.   ACTIVITY: As tolerated.   FOLLOWUP: Follow up with dialysis on Tuesday, Thursday, and Saturday, in 1 to 2 weeks with Dr. Rolm GalaHeidi Grandis.   HOSPITAL COURSE: The patient was admitted 12/01/2013, discharged 12/03/2013. Came in with shortness of breath was found to have bilateral pleural effusions, fluid overload likely congestive heart failure. The patient was started on IV Lasix and dialysis was done on July 3rd and 4th.   LABORATORY AND RADIOLOGICAL DATA DURING THE HOSPITAL COURSE: Included an initial chest x-ray that showed bilateral effusions associated with atelectasis, worse on the left than the right,  progressive since 06/19/2013, EKG showed atrial fibrillation, left axis deviation, right bundle branch block.   Hemoglobin A1c is 6.5. TSH 2.13, INR 1.6. Troponin negative. White blood cell count 9.1, H and H 13.7 and 42.5, platelet count of 247,000, glucose 122, BUN 22, creatinine 1.76, sodium 136, potassium 3.1, chloride 100, CO2 of 27. Liver function tests normal range. Two troponins negative.   Echocardiogram showed an EF of 55% to 60%, severely dilated left and right atrium, large pleural effusion, severe tricuspid regurgitation, severe mitral regurgitation, severely increased left ventricular posterior wall thickness, moderately elevated pulmonary arterial systolic pressure.   INR upon discharge 1.7, hemoglobin 14.2.   Chest x-ray showed cardiomegaly with pulmonary vascular congestion. No frank interstitial edema which had improved. Small-to-moderate left and small right pleural effusions, mildly improved.   HOSPITAL COURSE PER PROBLEM LIST:  1. For the patient's acute respiratory failure, initially required oxygen supplementation during the hospital course, now currently pulse oximetry is 93% on room air.  2. Acute diastolic congestive heart failure with severe mitral regurgitation, pleural effusions, left greater than right. The patient had 2 dialysis sessions on July 3rd and July 4th. She was on Lasix 40 mg IV q. 8 hours for 2-1/2 days, I was about to set up a thoracentesis for Monday on her left pleural effusion, but she came off oxygen and she was moving better air and had a decrease in the effusion on repeat chest x-ray on the day of discharge, so since the patient's clinical status improved with fluid removal with dialysis and IV Lasix, I decided not to do an invasive procedure and I discharge  the patient home. The patient is not on a beta blocker secondary to atrial fibrillation being rate controlled with Cardizem. Dialysis will help out with fluid management. Her new dry weight is 77 kg.  She is on Lasix 80 mg twice a day on nondialysis days, and she takes Lasix 80 mg in the p.m., after dialysis.  3. End-stage renal disease on hemodialysis. She had hemodialysis on July 3rd and 4th and will go back on her usual schedule Tuesday, Thursday, Saturday.  4. Atrial fibrillation, rate controlled with Cardizem, anticoagulation with Coumadin. I actually held her Coumadin for 1 day and started her on heparin drip thinking I was going to do a thoracentesis. Her INR upon discharge was 1.7. I advised her to increase it to 5 mg today, 3 mg tomorrow, and back on her usual schedule of Coumadin after that.  5. Weight loss. The patient has lost weight so her dry weight needs to be recalculated every so often because she did gain weight with regards to fluid.  6. Hypertension. Blood pressure is stable upon discharge 131/75.  TIME SPENT ON DISCHARGE: 35 minutes.     ____________________________ Herschell Dimes. Renae Gloss, MD rjw:lt D: 12/03/2013 14:20:11 ET T: 12/04/2013 01:32:15 ET JOB#: [id number]  cc: Herschell Dimes. Renae Gloss, MD, <Dictator> Letitia Caul, MD Laverda Sorenson, MD [facility]   Salley Scarlet MD ELECTRONICALLY SIGNED 12/04/2013 16:25

## 2014-09-22 NOTE — Op Note (Signed)
PATIENT NAME:  [name], [name] MR#:  [id number] DATE OF BIRTH:  Sep 26, 1933  DATE OF PROCEDURE:  01/22/2014  PREOPERATIVE DIAGNOSES:  1.  End-stage renal disease.  2.  Poorly functioning right arm arteriovenous fistula with large volume infiltration and bruising.  3.  Hypertension.  4.  Diabetes.   POSTOPERATIVE DIAGNOSES:  1.  End-stage renal disease.  2.  Poorly functioning right arm arteriovenous fistula with large volume infiltration and bruising.  3.  Hypertension.  4.  Diabetes.   PROCEDURES: 1.  Ultrasound guidance for vascular access to right brachiocephalic arteriovenous fistula.  2.  Right upper extremity fistulogram and central venogram.  3.  Percutaneous transluminal angioplasty of right cephalic vein/subclavian vein confluence with 8 mm diameter high-pressure angioplasty balloon.   SURGEON: Annice NeedyJason S Dew, MD   ANESTHESIA: Local with moderate conscious sedation.   ESTIMATED BLOOD LOSS: Minimal.   FLUOROSCOPY TIME: Approximately 2 minutes and 20 mL of  contrast were used.   INDICATION FOR PROCEDURE: A 81 year old white female with end-stage renal disease. Her dialysis access had been working reasonably well until about a week ago when she developed high pressures and a significant infiltration in her right upper extremity. She is brought in for a fistulogram for further evaluation and potential treatment. Risks and benefits were discussed. Informed consent was obtained.   DESCRIPTION OF PROCEDURE: The patient is brought to the vascular suite. The right upper extremity was sterilely prepped and draped and a sterile surgical field was created. The fistula was accessed not far from the anastomosis under direct ultrasound guidance and a patent portion of the fistula and a permanent image was recorded. A micropuncture wire and then sheath were   and then we upsized to a 6 [place] sheath. The patient was given 2500 units of intravenous heparin for systemic anticoagulation. Imaging was  then performed through the sheath. This demonstrated throughout the cephalic vein until the cephalic vein and subclavian vein confluence, the vein was widely patent. There was some significant tortuosity around the shoulder. At the cephalic vein and subclavian vein confluence, there was vein valve hyperplasia with about a 60% to 70% stenosis. The remainder of the central venous circulation was patent. I crossed this lesion without difficulty with a Magic torque wire and I selected an 8 mm diameter x 6 cm length high-pressure angioplasty balloon. This was inflated, it went until 24 atmospheres to completely resolve the narrowing in the vein, but at this point, the waist relieved and the inflation was held for 1 minute. With the balloon inflated, I performed imaging to try to opacify the arterial anastomosis, but this was not well seen due to an aneurysmal portion of the fistula near the access site, which likely correlated with the arterial access site and a branch that provided some outflow. The balloon was deflated, following and removed and completion angiogram was then performed. This showed brisk flow through this area. There was a mild residual stenosis that appeared to be in the 30% range but not flow limiting. At this point, I elected to terminate the procedure. The sheath was removed, 4-0 Monocryl pursestring suture was placed. Pressure was held. Sterile dressing was placed. The patient tolerated the procedure well and was taken to the recovery room in stable condition.    ____________________________ Annice NeedyJason S. Dew, MD jsd:TT D: 01/22/2014 09:15:29 ET T: 01/22/2014 16:26:11 ET JOB#: [id number]  cc: Annice NeedyJason S. Dew, MD, <Dictator> Annice NeedyJASON S DEW MD ELECTRONICALLY SIGNED 01/24/2014 10:33

## 2014-09-30 NOTE — Op Note (Signed)
PATIENT NAME:  [name], [name] MR#:  [id number] DATE OF BIRTH:  1934/02/16  DATE OF PROCEDURE:  07/04/2014  PREOPERATIVE DIAGNOSES: 1. End-stage renal disease.  2. Poorly functioning right arm arteriovenous fistula.  3. Right upper extremity hematoma.   POSTOPERATIVE DIAGNOSES:   1. End-stage renal disease.  2. Poorly functioning right arm arteriovenous fistula.  3. Right upper extremity hematoma.   PROCEDURES: 1. Ultrasound guidance for vascular access to right brachiocephalic AV fistula.  2. Right upper extremity fistulogram and central venogram.  3. Percutaneous transluminal angioplasty of right cephalic vein and subclavian vein confluence with 7 mm and 8 mm diameter high-pressure angioplasty balloon.  4. Percutaneous transluminal angioplasty of perianastomotic stenosis with 7 mm diameter high-pressure angioplasty balloon.   SURGEON: Annice NeedyJason S. Dew, MD   ANESTHESIA: Local with moderate conscious sedation.   BLOOD LOSS: 25 mL.   INDICATION FOR PROCEDURE: This is a 81 year old female with end-stage renal disease. Her right arm AV fistula has had some prolonged bleeding, some difficulties with access, and she had a very large right arm hematoma from an infiltration. This has been around for many weeks now and is really not resolving. She is brought in for fistulogram for further evaluation and potential treatment. I also want to ensure there is not a direct connection to the fistula to this large hematoma making a pseudoaneurysm. Risks and benefits were discussed. Informed consent was obtained.   DESCRIPTION OF PROCEDURE: The patient is brought to the vascular suite. The right upper extremity was sterilely prepped and draped and a sterile surgical field was created. The fistula was accessed around the arterial access site under direct ultrasound guidance with a micropuncture needle. A micropuncture wire and sheath were then placed, and we upsized to a 6 French sheath, permanent image was  recorded. Imaging was performed, which showed a very high-grade stenosis of the cephalic vein and subclavian vein confluence that was in excess of 85% to 90%. With compression of the fistula, the arterial anastomosis was also opacified. There was an area about 1-2 cm from the artery that had about a 60% to 65% stenosis. The remainder of the fistula was patent. There were a couple of small bulbous areas from the access sites, but no large pseudoaneurysms. There were also moderate amounts of large collaterals that were likely resultant from the cephalic vein and subclavian vein confluence stenosis. I crossed this lesion without difficulty with a Magic Torque wire and a Kumpe catheter. I then treated the cephalic vein and subclavian vein confluence with, initially, an 8 mm diameter angioplasty balloon. However, the waist would not break at burst pressure. I used a 7 mm diameter high-pressure with a suboptimal result and then upsized to an 8 mm diameter high-pressure angioplasty balloon inflated to over 20 atmospheres. Completion angiogram following this showed markedly improved flow. The collaterals were lessened and the area of stenosis was reduced to below 30%. At this point, I elected to turned my attention to the moderate arterial stenosis. I was able to flip the sheath with the help of the fluoroscopy and the Magic Torque wire and cross the arterial anastomosis and parked the tip of the sheath at the anastomosis. Imaging was performed there that opacified the stenosis and a 7 mm diameter x 4 cm length angioplasty balloon was taken just into the brachial artery and back into the cephalic vein, encompassing the lesion. This was inflated and narrowing was seen, which resolved with angioplasty, and completion angiogram showed improved flow. There was a  mild residual stenosis of about 30% that was not flow-limiting. At this point, I terminated the  procedure. The sheath was removed, 4-0 Monocryl pursestring suture was  placed. Pressure was held. Sterile dressings were placed. The patient tolerated the procedure well and was taken to the recovery room in stable condition.    ____________________________ Annice Needy, MD jsd:mw D: 07/04/2014 15:10:29 ET T: 07/04/2014 17:38:48 ET JOB#: [id number]  cc: Annice Needy, MD, <Dictator> Annice Needy MD ELECTRONICALLY SIGNED 07/11/2014 14:04

## 2014-10-08 ENCOUNTER — Encounter: Payer: Self-pay | Admitting: *Deleted

## 2014-10-08 ENCOUNTER — Ambulatory Visit
Admission: RE | Admit: 2014-10-08 | Discharge: 2014-10-08 | Disposition: A | Discharge status: Home / Self Care | Payer: Medicare Other | Source: Ambulatory Visit | Attending: Vascular Surgery | Admitting: Vascular Surgery

## 2014-10-08 ENCOUNTER — Encounter
Admission: RE | Disposition: A | Discharge status: Home / Self Care | Payer: Medicare Other | Source: Ambulatory Visit | Attending: Vascular Surgery

## 2014-10-08 DIAGNOSIS — Z79899 Other long term (current) drug therapy: Secondary | ICD-10-CM | POA: Insufficient documentation

## 2014-10-08 DIAGNOSIS — Z992 Dependence on renal dialysis: Secondary | ICD-10-CM | POA: Insufficient documentation

## 2014-10-08 DIAGNOSIS — Y999 Unspecified external cause status: Secondary | ICD-10-CM | POA: Diagnosis not present

## 2014-10-08 DIAGNOSIS — I120 Hypertensive chronic kidney disease with stage 5 chronic kidney disease or end stage renal disease: Secondary | ICD-10-CM | POA: Diagnosis not present

## 2014-10-08 DIAGNOSIS — E780 Pure hypercholesterolemia: Secondary | ICD-10-CM | POA: Insufficient documentation

## 2014-10-08 DIAGNOSIS — Z7982 Long term (current) use of aspirin: Secondary | ICD-10-CM | POA: Diagnosis not present

## 2014-10-08 DIAGNOSIS — Z794 Long term (current) use of insulin: Secondary | ICD-10-CM | POA: Insufficient documentation

## 2014-10-08 DIAGNOSIS — E119 Type 2 diabetes mellitus without complications: Secondary | ICD-10-CM | POA: Insufficient documentation

## 2014-10-08 DIAGNOSIS — T82838A Hemorrhage of vascular prosthetic devices, implants and grafts, initial encounter: Secondary | ICD-10-CM | POA: Insufficient documentation

## 2014-10-08 DIAGNOSIS — I499 Cardiac arrhythmia, unspecified: Secondary | ICD-10-CM | POA: Diagnosis not present

## 2014-10-08 DIAGNOSIS — N186 End stage renal disease: Secondary | ICD-10-CM | POA: Diagnosis not present

## 2014-10-08 HISTORY — DX: Unspecified osteoarthritis, unspecified site: M19.90

## 2014-10-08 HISTORY — DX: Chronic kidney disease, unspecified: N18.9

## 2014-10-08 HISTORY — DX: Essential (primary) hypertension: I10

## 2014-10-08 HISTORY — DX: Type 2 diabetes mellitus without complications: E11.9

## 2014-10-08 HISTORY — DX: Cerebral infarction, unspecified: I63.9

## 2014-10-08 HISTORY — PX: PERIPHERAL VASCULAR CATHETERIZATION: SHX172C

## 2014-10-08 HISTORY — DX: Cardiac arrhythmia, unspecified: I49.9

## 2014-10-08 LAB — BASIC METABOLIC PANEL
Anion gap: 13 (ref 5–15)
BUN: 72 mg/dL — ABNORMAL HIGH (ref 6–20)
CALCIUM: 8.8 mg/dL — AB (ref 8.9–10.3)
CO2: 27 mmol/L (ref 22–32)
Chloride: 98 mmol/L — ABNORMAL LOW (ref 101–111)
Creatinine, Ser: 3.8 mg/dL — ABNORMAL HIGH (ref 0.44–1.00)
GFR calc Af Amer: 12 mL/min — ABNORMAL LOW (ref >60)
GFR calc non Af Amer: 10 mL/min — ABNORMAL LOW (ref >60)
GLUCOSE: 83 mg/dL (ref 65–99)
Potassium: 3.6 mmol/L (ref 3.5–5.1)
SODIUM: 138 mmol/L (ref 135–145)

## 2014-10-08 LAB — PROTIME-INR
INR: 2.37
PROTHROMBIN TIME: 26 s — AB (ref 11.4–15.0)

## 2014-10-08 SURGERY — A/V SHUNTOGRAM/FISTULAGRAM
Anesthesia: Moderate Sedation | Laterality: Right

## 2014-10-08 MED ORDER — DEXTROSE 50 % IV SOLN
0.5000 | Freq: Once | INTRAVENOUS | Status: DC | PRN
  Filled 2014-10-08: qty 25

## 2014-10-08 MED ORDER — ATROPINE SULFATE 1 MG/ML IJ SOLN
0.5000 mg | Freq: Once | INTRAMUSCULAR | Status: DC | PRN
  Filled 2014-10-08: qty 0.5

## 2014-10-08 MED ORDER — IOHEXOL 300 MG/ML  SOLN
INTRAMUSCULAR | Status: DC | PRN
  Administered 2014-10-08: 25 mL via INTRAVENOUS

## 2014-10-08 MED ORDER — LABETALOL HCL 5 MG/ML IV SOLN: 10.0000 mg | INTRAVENOUS | Status: DC | PRN

## 2014-10-08 MED ORDER — MIDAZOLAM HCL 2 MG/2ML IJ SOLN
INTRAMUSCULAR | Status: DC | PRN
  Administered 2014-10-08: 2 mg via INTRAVENOUS

## 2014-10-08 MED ORDER — HEPARIN (PORCINE) IN NACL 2-0.9 UNIT/ML-% IJ SOLN
INTRAMUSCULAR | Status: AC
  Filled 2014-10-08: qty 1000

## 2014-10-08 MED ORDER — LIDOCAINE-EPINEPHRINE (PF) 1 %-1:200000 IJ SOLN
INTRAMUSCULAR | Status: AC
  Filled 2014-10-08: qty 30

## 2014-10-08 MED ORDER — METOPROLOL TARTRATE 1 MG/ML IV SOLN: 2.0000 mg | INTRAVENOUS | Status: DC | PRN

## 2014-10-08 MED ORDER — ACETAMINOPHEN 325 MG RE SUPP: 325.0000 mg | RECTAL | Status: DC | PRN

## 2014-10-08 MED ORDER — MIDAZOLAM HCL 5 MG/5ML IJ SOLN
INTRAMUSCULAR | Status: AC
  Filled 2014-10-08: qty 5

## 2014-10-08 MED ORDER — CEFAZOLIN SODIUM 1-5 GM-% IV SOLN
INTRAVENOUS | Status: AC
  Filled 2014-10-08: qty 50

## 2014-10-08 MED ORDER — ONDANSETRON HCL 4 MG/2ML IJ SOLN: 4.0000 mg | INTRAMUSCULAR | Status: DC | PRN

## 2014-10-08 MED ORDER — PHENOL 1.4 % MT LIQD: 1.0000 | OROMUCOSAL | Status: DC | PRN

## 2014-10-08 MED ORDER — HYDRALAZINE HCL 20 MG/ML IJ SOLN: 5.0000 mg | INTRAMUSCULAR | Status: DC | PRN

## 2014-10-08 MED ORDER — CEFAZOLIN SODIUM 1-5 GM-% IV SOLN
1.0000 g | Freq: Once | INTRAVENOUS | Status: AC
  Administered 2014-10-08: 1 g via INTRAVENOUS
  Filled 2014-10-08: qty 50

## 2014-10-08 MED ORDER — ACETAMINOPHEN 325 MG PO TABS: 325.0000 mg | ORAL_TABLET | ORAL | Status: DC | PRN

## 2014-10-08 MED ORDER — SODIUM CHLORIDE 0.9 % IV SOLN: 500.0000 mL | Freq: Once | INTRAVENOUS | Status: DC | PRN

## 2014-10-08 MED ORDER — HEPARIN SODIUM (PORCINE) 1000 UNIT/ML IJ SOLN
INTRAMUSCULAR | Status: DC | PRN
  Administered 2014-10-08: 3000 [IU] via INTRAVENOUS

## 2014-10-08 MED ORDER — ONDANSETRON HCL 4 MG/2ML IJ SOLN: 4.0000 mg | Freq: Four times a day (QID) | INTRAMUSCULAR | Status: DC | PRN

## 2014-10-08 MED ORDER — SODIUM CHLORIDE 0.9 % IV SOLN
INTRAVENOUS | Status: DC
  Administered 2014-10-08: 12:00:00 via INTRAVENOUS

## 2014-10-08 MED ORDER — FENTANYL CITRATE (PF) 100 MCG/2ML IJ SOLN
INTRAMUSCULAR | Status: DC | PRN
  Administered 2014-10-08: 50 ug via INTRAVENOUS

## 2014-10-08 MED ORDER — FENTANYL CITRATE (PF) 100 MCG/2ML IJ SOLN
INTRAMUSCULAR | Status: AC
  Filled 2014-10-08: qty 2

## 2014-10-08 SURGICAL SUPPLY — 11 items
BALLN DORADO 8X60X80 (BALLOONS) ×4
BALLN LUTONIX DCB 7X60X130 (BALLOONS) ×4
BALLOON DORADO 8X60X80 (BALLOONS) ×2 IMPLANT
BALLOON LUTONIX DCB 7X60X130 (BALLOONS) ×2 IMPLANT
DEVICE PRESTO INFLATION (MISCELLANEOUS) ×4 IMPLANT
DRAPE BRACHIAL (DRAPES) ×4 IMPLANT
KIT 5FR STIFF NT/TG (MISCELLANEOUS) ×4 IMPLANT
PACK ANGIOGRAPHY (CUSTOM PROCEDURE TRAY) ×4 IMPLANT
SHEATH BRITE TIP 6FRX5.5 (SHEATH) ×4 IMPLANT
TOWEL OR 17X26 4PK STRL BLUE (TOWEL DISPOSABLE) ×4 IMPLANT
WIRE MAGIC TOR.035 180C (WIRE) ×4 IMPLANT

## 2014-10-08 NOTE — H&P (Signed)
[facility] Admission History & Physical  MRN : [id number]  [name] is a 81 y.o. (03/29/1934) female who presents with chief complaint of poorly functional dialysis access.  History of Present Illness: Patient is a 81 year old female who presents from her dialysis center with a poorly functioning dialysis access. They have had difficulty with low flows on her dialysis treatments and have referred her for a fistulogram for further evaluation. She has no other specific complaints today.  No current facility-administered medications for this encounter.    Past Medical History  Diagnosis Date  . Chronic kidney disease   . Dysrhythmia   . Diabetes mellitus without complication   . Hypertension     History reviewed. No pertinent past surgical history.  Social History History  Substance Use Topics  . Smoking status: Never Smoker   . Smokeless tobacco: Not on file  . Alcohol Use: No    Family History History reviewed. No pertinent family history.  Allergies  Allergen Reactions  . Codeine Other (See Comments)    confusion     REVIEW OF SYSTEMS (Negative unless checked)  Constitutional: [] Weight loss  [] Fever  [] Chills Cardiac: [] Chest pain   [] Chest pressure   [] Palpitations   [] Shortness of breath when laying flat   [] Shortness of breath at rest   [] Shortness of breath with exertion. Vascular:  [] Pain in legs with walking   [] Pain in legs at rest   [] Pain in legs when laying flat   [] Claudication   [] Pain in feet when walking  [] Pain in feet at rest  [] Pain in feet when laying flat   [] History of DVT   [] Phlebitis   [] Swelling in legs   [] Varicose veins   [] Non-healing ulcers Pulmonary:   [] Uses home oxygen   [] Productive cough   [] Hemoptysis   [] Wheeze  [] COPD   [] Asthma Neurologic:  [] Dizziness  [] Blackouts   [] Seizures   [] History of stroke   [] History of TIA  [] Aphasia   [] Temporary blindness   [] Dysphagia   [] Weakness or numbness in arms    [] Weakness or numbness in legs Musculoskeletal:  [] Arthritis   [] Joint swelling   [] Joint pain   [] Low back pain Hematologic:  [] Easy bruising  [] Easy bleeding   [] Hypercoagulable state   [] Anemic  [] Hepatitis Gastrointestinal:  [] Blood in stool   [] Vomiting blood  [] Gastroesophageal reflux/heartburn   [] Difficulty swallowing. Genitourinary:  [] Chronic kidney disease   [] Difficult urination  [] Frequent urination  [] Burning with urination   [] Blood in urine Skin:  [] Rashes   [] Ulcers   [] Wounds Psychological:  [] History of anxiety   []  History of major depression.  Physical Examination  Filed Vitals:   10/08/14 1130  BP: 128/53  Temp: 97.7 F (36.5 C)  TempSrc: Oral  Resp: 16  Height: 5\' 5"  (1.651 m)  Weight: 80.287 kg (177 lb)  SpO2: 96%   Body mass index is 29.45 kg/(m^2).  Head: [place]/AT, No temporalis wasting. Prominent temp pulse not noted. Ear/Nose/Throat: Hearing grossly intact, nares w/o erythema or drainage, oropharynx w/o Erythema/Exudate, Mallampati score: 2.  Eyes: PERRLA, EOMI.  Neck: Supple, no nuchal rigidity.  No bruit or JVD.  Pulmonary:  Good air movement, clear to auscultation bilaterally, no use of accessory muscles.  Cardiac: RRR, normal S1, S2, no Murmurs, rubs or gallops. Vascular: Right arm AV fistula with diminished thrill  Gastrointestinal: soft, non-tender/non-distended. No guarding/reflex.  Musculoskeletal: M/S 5/5 throughout.  Extremities without ischemic changes.  No deformity or atrophy.  Neurologic: CN 2-12 intact. Pain and light touch intact in extremities.  Symmetrical.  Speech is fluent. Motor exam as listed above. Psychiatric: Judgment intact, Mood & affect appropriate for pt's clinical situation. Dermatologic: No rashes or ulcers noted.  No cellulitis or open wounds. Lymph : No Cervical, Axillary, or Inguinal lymphadenopathy.  Diagnostic Studies None    CBC Lab Results  Component Value Date    WBC 7.9 12/03/2013   HGB 14.2 12/03/2013   HCT 42.7 12/03/2013   MCV 94 12/03/2013   PLT 214 12/03/2013    BMET    Component Value Date/Time   NA 137 07/04/2014 1303   K 3.3* 07/04/2014 1303   CL 100 07/04/2014 1303   CO2 29 07/04/2014 1303   GLUCOSE 76 07/04/2014 1303   BUN 44* 07/04/2014 1303   CREATININE 2.72* 07/04/2014 1303   CALCIUM 8.9 07/04/2014 1303   GFRNONAA 29* 12/02/2013 0847   GFRAA 34* 12/02/2013 0847   CrCl cannot be calculated (Patient has no serum creatinine result on file.).  COAG Lab Results  Component Value Date   INR 1.8 07/04/2014   INR 1.5 01/22/2014   INR 1.7 12/03/2013     Assessment/Plan 1. End-stage renal disease 2. Poorly functioning right arm AV fistula  We'll plan fistulogram with possible intervention for further evaluation of her dysfunctional access.   Laysha Childers, MD  10/08/2014 12:00 PM

## 2014-10-08 NOTE — Discharge Summary (Addendum)
3-4 week follow up appointment made. Daughter requesting a Friday appointment. Friday available on 12/07/14 at 10:15 per AV and VS scheduler

## 2014-10-08 NOTE — Discharge Instructions (Signed)
Fistulogram °A fistulogram is an X-ray test to look inside the site where your blood is removed and returned to your body during dialysis (dialysis fistula). Your fistula allows easy and effective access, but sometimes problems can occur, such as narrowing or blockages. A narrowing or blockage can reduce the effectiveness of dialysis. A fistulogram helps to detect these problems. It also lets your health care provider know of any additional treatment you may need.  °LET YOUR HEALTH CARE PROVIDER KNOW ABOUT: °· Any allergies you have.    °· All medicines you are taking, including vitamins, herbs, eye drops, creams, and over-the-counter medicines. °· Previous problems you or members of your family have had with the use of anesthetics. °· Any reactions you have had to contrast dye. °· Any blood disorders you have. °· Previous surgeries you have had. °· Medical conditions you have. °· Pain, redness, or swelling in your limb with the dialysis fistula. °· Any bleeding from your dialysis fistula. °· Any of the following in the part of your body where the dialysis fistula leads: °¨ Numbness. °¨ Tingling. °¨ Cold feeling. °¨ Bluish or pale white in color. °RISKS AND COMPLICATIONS °Generally, a fistulogram is a safe procedure. However, problems can occur and include: °· Bleeding. °· Infection. °· A blood clot in the dialysis fistula. °· A blood clot that travels to your lungs (pulmonary embolism). °BEFORE THE PROCEDURE °· Your health care provider may do some blood or urine tests or both. These will help your health care provider learn how well your kidneys and liver are working and how well your blood clots. °· Ask your health care provider about: °¨ Changing or stopping your regular medicines. This is especially important if you are taking diabetes medicines or blood thinners. °¨ Taking medicines such as aspirin and ibuprofen. These medicines can thin your blood. Do not take these medicines before your procedure if your  health care provider asks you not to. °· Do not eat or drink anything after midnight on the night before the procedure or as directed by your health care provider. °· Ask your health care provider if you will be able to go home after the procedure. Some people stay overnight in the hospital. °· Plan to have someone take you home after the procedure. °PROCEDURE  °· A needle will be inserted into your arm. It will be used to give you medicine. Medicines given may include: °¨ Numbing medicine (local anesthetic) to numb an area on the limb with your fistula. °¨ A medicine to help you relax (sedative). °¨ A medicine that makes you fall asleep (general anesthetic). °· When your skin is numb, a needle will be inserted into your vein. °· A thin, flexible tube (catheter) will be inserted into the needle and guided to the narrowed area. °· Dye (contrast material) will be injected into the catheter. As the contrast material passes through your body, you may feel warm. °· X-rays will be taken. The contrast material will show where any narrowing or blockages are. °· A guide wire and balloon-tipped catheter will be advanced to the blockage site. °· The balloon will be inflated for a short period of time. The balloon may be inflated several times at this site, or the balloon may be moved to other sites. °· If your health care provider determines the clot will be best treated by a clot-dissolving medicine (thrombolysis), this medicine can be delivered through the catheter instead of using the balloon. °· A mechanical device at the end of the catheter can   also be used to break up the clot (thrombectomy). °· At the end of the procedure, the catheter will be removed. °· In some cases, the catheter site will be closed with a stitch or two. °· Pressure will be applied to stop any bleeding, and your skin will be covered with a bandage (dressing). °AFTER THE PROCEDURE °· You will stay in a recovery area until the medicines have worn  off. °· You will stay in bed for several hours. °· As you begin to feel better, you will be offered ice and beverages. You may be given food. °· Your health care provider will check your blood pressure and pulse and watch your access site. °· When you can walk, drink, eat, and use the bathroom, you may be discharged. °Document Released: 10/02/2013 Document Reviewed: 07/07/2013 °[employer]® Patient Information ©2015 [employer]. This information is not intended to replace advice given to you by your health care provider. Make sure you discuss any questions you have with your health care provider. ° °

## 2014-10-08 NOTE — H&P (Signed)
[facility] History & Physical Update  The patient was interviewed and re-examined.  The patient's previous History and Physical has been reviewed and is unchanged.  There is no change in the plan of care.  Wadie Mattie, MD  10/08/2014, 11:46 AM

## 2014-10-08 NOTE — Op Note (Signed)
[name]    OPERATIVE NOTE   PROCEDURE: 1.  right brachiocephalic arteriovenous fistula cannulation under ultrasound guidance 2.  right arm fistulagram 3.  Percutaneous transluminal angioplasty of cephalic vein subclavian vein confluence with 7 mm diameter Lutonix drug-coated angioplasty balloon and 7 mm diameter high pressure angioplasty balloon  PRE-OPERATIVE DIAGNOSIS: 1. ESRD 2. Malfunctioning right arteriovenous fistula with decreased flow and prolonged bleeding  POST-OPERATIVE DIAGNOSIS: same as above   SURGEON: Leotis Pain, MD   ANESTHESIA: local with MCS  ESTIMATED BLOOD LOSS: minimal  FINDING(S): 1. 80-85% stenosis at cephalic vein subclavian vein confluence, improved to 20% after angioplasty  SPECIMEN(S):  None  CONTRAST: 25 cc  INDICATIONS: [name] is a 81 y.o. female who presents with malfunctioning right brachiocephalic arteriovenous fistula.  The patient is scheduled for right arm fistulagram due to prolonged bleeding and poor flow on dialysis.  The patient is aware the risks include but are not limited to: bleeding, infection, thrombosis of the cannulated access, and possible anaphylactic reaction to the contrast.  The patient is aware of the risks of the procedure and elects to proceed forward.  DESCRIPTION: After full informed written consent was obtained, the patient was brought back to the angiography suite and placed supine upon the angiography table.  The patient was connected to monitoring equipment.  The right arm was prepped and draped in the standard fashion for a percutaneous access intervention.  Under ultrasound guidance, the right brachiocephalic arteriovenous fistula was cannulated with a micropuncture needle under direct ultrasound guidance and a permanent image was performed.  The microwire was advanced into the fistula and the needle was exchanged for the a microsheath.  I then upsized to a 6 Fr Sheath and imaging was  performed.  Hand injections were completed to image the access including the central venous system. This demonstrated an approximately 80-85% stenosis of the cephalic vein at the subclavian vein confluence.  Based on the images, this patient will need intervention to this site. I then gave the patient 3000 units of intravenous heparin.  I then crossed the stenosis with a Magic Tourqe wire.  Based on the imaging, a 7 mm x 6 cm   Lutonix drug-coated angioplasty balloon was selected.  The balloon was centered around the cephalic vein subclavian vein confluence stenosis and inflated to 14 ATM for 1 minute(s), but the waist would not break. I then used a 7 mm diameter by 4 cm length high pressure angioplasty balloon inflated to 24 atm. This resolved the waist and the inflation was held for 1 minute and the balloon was then deflated and completion imaging was performed.  On completion imaging, a 20% residual stenosis was present.     Based on the completion imaging, no further intervention is necessary.  The wire and balloon were removed from the sheath.  A 4-0 Monocryl purse-string suture was sewn around the sheath.  The sheath was removed while tying down the suture.  A sterile bandage was applied to the puncture site.  COMPLICATIONS:  none  CONDITION:  stable   [name]  10/08/2014 1:21 PM

## 2014-10-10 ENCOUNTER — Encounter: Payer: Self-pay | Admitting: Vascular Surgery

## 2014-12-11 ENCOUNTER — Ambulatory Visit
Admission: RE | Admit: 2014-12-11 | Discharge: 2014-12-11 | Disposition: A | Discharge status: Home / Self Care | Payer: Medicare Other | Source: Ambulatory Visit | Attending: Nephrology | Admitting: Nephrology

## 2014-12-11 ENCOUNTER — Encounter: Payer: Self-pay | Admitting: Emergency Medicine

## 2014-12-11 ENCOUNTER — Other Ambulatory Visit: Payer: Self-pay | Admitting: Nephrology

## 2014-12-11 ENCOUNTER — Inpatient Hospital Stay
Admission: EM | Admit: 2014-12-11 | Discharge: 2014-12-13 | DRG: 291 | Disposition: A | Discharge status: Home / Self Care | Payer: Medicare Other | Attending: Internal Medicine | Admitting: Internal Medicine

## 2014-12-11 DIAGNOSIS — R0600 Dyspnea, unspecified: Secondary | ICD-10-CM

## 2014-12-11 DIAGNOSIS — D631 Anemia in chronic kidney disease: Secondary | ICD-10-CM | POA: Diagnosis present

## 2014-12-11 DIAGNOSIS — I700 Atherosclerosis of aorta: Secondary | ICD-10-CM | POA: Insufficient documentation

## 2014-12-11 DIAGNOSIS — Z8673 Personal history of transient ischemic attack (TIA), and cerebral infarction without residual deficits: Secondary | ICD-10-CM | POA: Diagnosis not present

## 2014-12-11 DIAGNOSIS — Z9981 Dependence on supplemental oxygen: Secondary | ICD-10-CM | POA: Diagnosis not present

## 2014-12-11 DIAGNOSIS — E213 Hyperparathyroidism, unspecified: Secondary | ICD-10-CM | POA: Diagnosis present

## 2014-12-11 DIAGNOSIS — J9601 Acute respiratory failure with hypoxia: Secondary | ICD-10-CM | POA: Diagnosis present

## 2014-12-11 DIAGNOSIS — I5033 Acute on chronic diastolic (congestive) heart failure: Secondary | ICD-10-CM | POA: Diagnosis present

## 2014-12-11 DIAGNOSIS — Z992 Dependence on renal dialysis: Secondary | ICD-10-CM

## 2014-12-11 DIAGNOSIS — M479 Spondylosis, unspecified: Secondary | ICD-10-CM

## 2014-12-11 DIAGNOSIS — E119 Type 2 diabetes mellitus without complications: Secondary | ICD-10-CM | POA: Diagnosis present

## 2014-12-11 DIAGNOSIS — I120 Hypertensive chronic kidney disease with stage 5 chronic kidney disease or end stage renal disease: Secondary | ICD-10-CM | POA: Diagnosis present

## 2014-12-11 DIAGNOSIS — M8580 Other specified disorders of bone density and structure, unspecified site: Secondary | ICD-10-CM | POA: Insufficient documentation

## 2014-12-11 DIAGNOSIS — J918 Pleural effusion in other conditions classified elsewhere: Secondary | ICD-10-CM | POA: Diagnosis present

## 2014-12-11 DIAGNOSIS — R0602 Shortness of breath: Secondary | ICD-10-CM

## 2014-12-11 DIAGNOSIS — Z66 Do not resuscitate: Secondary | ICD-10-CM | POA: Diagnosis present

## 2014-12-11 DIAGNOSIS — I482 Chronic atrial fibrillation: Secondary | ICD-10-CM | POA: Diagnosis present

## 2014-12-11 DIAGNOSIS — N186 End stage renal disease: Secondary | ICD-10-CM | POA: Diagnosis present

## 2014-12-11 DIAGNOSIS — R911 Solitary pulmonary nodule: Secondary | ICD-10-CM | POA: Diagnosis present

## 2014-12-11 DIAGNOSIS — J90 Pleural effusion, not elsewhere classified: Secondary | ICD-10-CM

## 2014-12-11 DIAGNOSIS — J989 Respiratory disorder, unspecified: Secondary | ICD-10-CM | POA: Insufficient documentation

## 2014-12-11 DIAGNOSIS — E785 Hyperlipidemia, unspecified: Secondary | ICD-10-CM | POA: Diagnosis present

## 2014-12-11 DIAGNOSIS — Z7901 Long term (current) use of anticoagulants: Secondary | ICD-10-CM | POA: Diagnosis not present

## 2014-12-11 DIAGNOSIS — R0603 Acute respiratory distress: Secondary | ICD-10-CM | POA: Diagnosis present

## 2014-12-11 HISTORY — DX: Dependence on renal dialysis: Z99.2

## 2014-12-11 LAB — CBC
HCT: 39.4 % (ref 35.0–47.0)
Hemoglobin: 12.8 g/dL (ref 12.0–16.0)
MCH: 31.2 pg (ref 26.0–34.0)
MCHC: 32.6 g/dL (ref 32.0–36.0)
MCV: 95.5 fL (ref 80.0–100.0)
PLATELETS: 269 10*3/uL (ref 150–440)
RBC: 4.12 MIL/uL (ref 3.80–5.20)
RDW: 15.3 % — AB (ref 11.5–14.5)
WBC: 9.8 10*3/uL (ref 3.6–11.0)

## 2014-12-11 LAB — COMPREHENSIVE METABOLIC PANEL
ALBUMIN: 3.4 g/dL — AB (ref 3.5–5.0)
ALT: 23 U/L (ref 14–54)
AST: 36 U/L (ref 15–41)
Alkaline Phosphatase: 97 U/L (ref 38–126)
Anion gap: 11 (ref 5–15)
BUN: 31 mg/dL — AB (ref 6–20)
CO2: 27 mmol/L (ref 22–32)
CREATININE: 2.56 mg/dL — AB (ref 0.44–1.00)
Calcium: 8.2 mg/dL — ABNORMAL LOW (ref 8.9–10.3)
Chloride: 98 mmol/L — ABNORMAL LOW (ref 101–111)
GFR, EST AFRICAN AMERICAN: 19 mL/min — AB (ref >60)
GFR, EST NON AFRICAN AMERICAN: 17 mL/min — AB (ref >60)
Glucose, Bld: 307 mg/dL — ABNORMAL HIGH (ref 65–99)
POTASSIUM: 3.5 mmol/L (ref 3.5–5.1)
SODIUM: 136 mmol/L (ref 135–145)
TOTAL PROTEIN: 8 g/dL (ref 6.5–8.1)
Total Bilirubin: 0.4 mg/dL (ref 0.3–1.2)

## 2014-12-11 LAB — PROTIME-INR
INR: 2.02
PROTHROMBIN TIME: 23 s — AB (ref 11.4–15.0)

## 2014-12-11 LAB — GLUCOSE, CAPILLARY: Glucose-Capillary: 270 mg/dL — ABNORMAL HIGH (ref 65–99)

## 2014-12-11 MED ORDER — PRAVASTATIN SODIUM 20 MG PO TABS
20.0000 mg | ORAL_TABLET | Freq: Every evening | ORAL | Status: DC
  Administered 2014-12-11 – 2014-12-13 (×3): 20 mg via ORAL
  Filled 2014-12-11 (×3): qty 1

## 2014-12-11 MED ORDER — ONDANSETRON HCL 4 MG/2ML IJ SOLN: 4.0000 mg | Freq: Four times a day (QID) | INTRAMUSCULAR | Status: DC | PRN

## 2014-12-11 MED ORDER — BIOTIN 1 MG PO CAPS: ORAL_CAPSULE | Freq: Every morning | ORAL | Status: DC

## 2014-12-11 MED ORDER — DIALYVITE 3000 3 MG PO TABS: 1.0000 | ORAL_TABLET | Freq: Every day | ORAL | Status: DC

## 2014-12-11 MED ORDER — ONDANSETRON HCL 4 MG PO TABS: 4.0000 mg | ORAL_TABLET | Freq: Four times a day (QID) | ORAL | Status: DC | PRN

## 2014-12-11 MED ORDER — SODIUM CHLORIDE 0.9 % IJ SOLN: 3.0000 mL | INTRAMUSCULAR | Status: DC | PRN

## 2014-12-11 MED ORDER — ACETAMINOPHEN 650 MG RE SUPP: 650.0000 mg | Freq: Four times a day (QID) | RECTAL | Status: DC | PRN

## 2014-12-11 MED ORDER — HYDROCODONE-ACETAMINOPHEN 5-325 MG PO TABS: 1.0000 | ORAL_TABLET | ORAL | Status: DC | PRN

## 2014-12-11 MED ORDER — ADULT MULTIVITAMIN W/MINERALS CH
1.0000 | ORAL_TABLET | Freq: Every day | ORAL | Status: DC
  Administered 2014-12-12 – 2014-12-13 (×2): 1 via ORAL
  Filled 2014-12-11 (×2): qty 1

## 2014-12-11 MED ORDER — FUROSEMIDE 40 MG PO TABS
80.0000 mg | ORAL_TABLET | Freq: Two times a day (BID) | ORAL | Status: DC
  Administered 2014-12-12 – 2014-12-13 (×4): 80 mg via ORAL
  Filled 2014-12-11 (×4): qty 2

## 2014-12-11 MED ORDER — INSULIN DETEMIR 100 UNIT/ML FLEXPEN: 10.0000 [IU] | PEN_INJECTOR | Freq: Every day | SUBCUTANEOUS | Status: DC

## 2014-12-11 MED ORDER — ACETAMINOPHEN 325 MG PO TABS
650.0000 mg | ORAL_TABLET | Freq: Four times a day (QID) | ORAL | Status: DC | PRN
  Administered 2014-12-11 – 2014-12-12 (×3): 650 mg via ORAL
  Filled 2014-12-11 (×2): qty 2

## 2014-12-11 MED ORDER — GABAPENTIN 300 MG PO CAPS
300.0000 mg | ORAL_CAPSULE | Freq: Every day | ORAL | Status: DC
  Administered 2014-12-11 – 2014-12-12 (×2): 300 mg via ORAL
  Filled 2014-12-11 (×2): qty 1

## 2014-12-11 MED ORDER — ACETAMINOPHEN ER 650 MG PO TBCR
650.0000 mg | EXTENDED_RELEASE_TABLET | Freq: Two times a day (BID) | ORAL | Status: DC
  Administered 2014-12-12: 650 mg via ORAL
  Filled 2014-12-11 (×5): qty 1

## 2014-12-11 MED ORDER — ALLOPURINOL 100 MG PO TABS
50.0000 mg | ORAL_TABLET | Freq: Every day | ORAL | Status: DC
  Administered 2014-12-12 – 2014-12-13 (×2): 50 mg via ORAL
  Filled 2014-12-11 (×2): qty 1

## 2014-12-11 MED ORDER — DILTIAZEM HCL ER COATED BEADS 240 MG PO CP24
240.0000 mg | ORAL_CAPSULE | Freq: Every day | ORAL | Status: DC
  Administered 2014-12-12 – 2014-12-13 (×2): 240 mg via ORAL
  Filled 2014-12-11 (×2): qty 1

## 2014-12-11 MED ORDER — INSULIN DETEMIR 100 UNIT/ML ~~LOC~~ SOLN
10.0000 [IU] | Freq: Every day | SUBCUTANEOUS | Status: DC
  Administered 2014-12-11 – 2014-12-12 (×2): 10 [IU] via SUBCUTANEOUS
  Filled 2014-12-11 (×3): qty 0.1

## 2014-12-11 MED ORDER — PANTOPRAZOLE SODIUM 40 MG PO TBEC
40.0000 mg | DELAYED_RELEASE_TABLET | Freq: Every day | ORAL | Status: DC
  Administered 2014-12-12 – 2014-12-13 (×2): 40 mg via ORAL
  Filled 2014-12-11 (×2): qty 1

## 2014-12-11 MED ORDER — INSULIN ASPART 100 UNIT/ML ~~LOC~~ SOLN
SUBCUTANEOUS | Status: AC
  Filled 2014-12-11: qty 5

## 2014-12-11 MED ORDER — BISACODYL 5 MG PO TBEC
5.0000 mg | DELAYED_RELEASE_TABLET | Freq: Every day | ORAL | Status: DC | PRN
  Administered 2014-12-12 – 2014-12-13 (×2): 5 mg via ORAL
  Filled 2014-12-11 (×2): qty 1

## 2014-12-11 MED ORDER — SODIUM CHLORIDE 0.9 % IJ SOLN: 3.0000 mL | Freq: Two times a day (BID) | INTRAMUSCULAR | Status: DC

## 2014-12-11 MED ORDER — PAROXETINE HCL 10 MG PO TABS
40.0000 mg | ORAL_TABLET | Freq: Every evening | ORAL | Status: DC
  Administered 2014-12-11 – 2014-12-13 (×3): 40 mg via ORAL
  Filled 2014-12-11 (×3): qty 4

## 2014-12-11 MED ORDER — INSULIN ASPART 100 UNIT/ML ~~LOC~~ SOLN
0.0000 [IU] | Freq: Three times a day (TID) | SUBCUTANEOUS | Status: DC
  Administered 2014-12-11: 5 [IU] via SUBCUTANEOUS
  Administered 2014-12-12 – 2014-12-13 (×2): 1 [IU] via SUBCUTANEOUS
  Administered 2014-12-13: 3 [IU] via SUBCUTANEOUS
  Filled 2014-12-11: qty 3
  Filled 2014-12-11 (×2): qty 1

## 2014-12-11 NOTE — H&P (Signed)
[facility] [place] [facility]   PATIENT NAME: Natalie LeighJoann Stenseth    MR#:  [id number]  DATE OF BIRTH:  April 14, 1934  DATE OF ADMISSION:  12/11/2014  PRIMARY CARE PHYSICIAN: Rolm GalaGRANDIS, HEIDI, MD   REQUESTING/REFERRING PHYSICIAN: Dr Thomasene MohairPaduhowski  CHIEF COMPLAINT:   Increasing shortness of breath for last 1 week. HISTORY OF PRESENT ILLNESS:  [name]  is a 81 y.o. 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 She was seen at dialysis today and according to patient about 3 L of fluid was removed. She was exhausted and felt very short of breath after she went home and daughter decided to bring her to the emergency room. Patient's chest x-ray shows complete white out on left side consistent with large left pleural effusion. She is being admitted with acute hypoxic respiratory failure secondary to possible acute on chronic diastolic congestive heart failure with large left pleural effusion. Patient had similar symptoms in July 2015.   PAST MEDICAL HISTORY:   Past Medical History  Diagnosis Date  . Chronic kidney disease   . Dysrhythmia   . Diabetes mellitus without complication   . Hypertension   . Stroke   . Arthritis     gout  . Dialysis patient     PAST SURGICAL HISTOIRY:   Past Surgical History  Procedure Laterality Date  . Cholecystectomy    . Peripheral vascular catheterization N/A 10/08/2014    Procedure: A/V Shuntogram/Fistulagram;  Surgeon: Annice NeedyJason S Dew, MD;  Location: [facility] INVASIVE CV LAB;  Service: Cardiovascular;  Laterality: N/A;  . Joint replacement      bilateral hip    SOCIAL HISTORY:   History  Substance Use Topics  . Smoking status: Never Smoker   . Smokeless tobacco: Not on file  . Alcohol Use: No    FAMILY  HISTORY:  No family history on file.  DRUG ALLERGIES:   Allergies  Allergen Reactions  . Codeine Other (See Comments)    confusion  . Oxycodone     Confusion  . Quinine Derivatives Rash    REVIEW OF SYSTEMS:  Review of Systems  Constitutional: Positive for malaise/fatigue. Negative for fever, chills and weight loss.  HENT: Negative for ear discharge, ear pain and nosebleeds.   Eyes: Negative for blurred vision, pain and discharge.  Respiratory: Positive for shortness of breath. Negative for cough, sputum production, wheezing and stridor.   Cardiovascular: Negative for chest pain, palpitations, orthopnea and PND.  Gastrointestinal: Negative for nausea, vomiting, abdominal pain and diarrhea.  Genitourinary: Negative for urgency and frequency.  Musculoskeletal: Negative for back pain and joint pain.  Neurological: Positive for weakness. Negative for sensory change, speech change and focal weakness.  Psychiatric/Behavioral: Negative for depression and hallucinations. The patient is not nervous/anxious.   All other systems reviewed and are negative.    MEDICATIONS AT HOME:   Prior to Admission medications   Medication Sig Start Date End Date Taking? Authorizing Provider  acetaminophen (TYLENOL 8 HOUR ARTHRITIS PAIN) 650 MG CR tablet Take 650 mg by mouth 2 (two) times daily.   Yes Historical Provider, MD  allopurinol (ZYLOPRIM) 100 MG tablet Take 50 mg by mouth daily.   Yes Historical Provider, MD  Biotin 1 MG CAPS Take by mouth every morning.   Yes Historical Provider, MD  bisacodyl (DULCOLAX)  5 MG EC tablet Take 5 mg by mouth daily as needed for moderate constipation.   Yes Historical Provider, MD  diltiazem (CARDIZEM CD) 240 MG 24 hr capsule Take 240 mg by mouth daily.   Yes Historical Provider, MD  esomeprazole (NEXIUM) 20 MG capsule Take 20 mg by mouth at bedtime.   Yes Historical Provider, MD  folic acid-vitamin b complex-vitamin c-selenium-zinc (DIALYVITE) 3 MG TABS tablet  Take 1 tablet by mouth daily.   Yes Historical Provider, MD  furosemide (LASIX) 80 MG tablet Take 80 mg by mouth 2 (two) times daily.   Yes Historical Provider, MD  gabapentin (NEURONTIN) 300 MG capsule Take 300 mg by mouth at bedtime.   Yes Historical Provider, MD  Insulin Detemir (LEVEMIR) 100 UNIT/ML Pen Inject 10-14 Units into the skin daily at 10 pm.   Yes Historical Provider, MD  PARoxetine (PAXIL) 40 MG tablet Take 40 mg by mouth every evening.   Yes Historical Provider, MD  pravastatin (PRAVACHOL) 20 MG tablet Take 20 mg by mouth every evening.   Yes Historical Provider, MD  warfarin (COUMADIN) 3 MG tablet Take 3 mg by mouth daily. Except on sundays   Yes Historical Provider, MD  acetaminophen (TYLENOL) 500 MG tablet Take 500 mg by mouth every 6 (six) hours as needed for moderate pain.    Historical Provider, MD      VITAL SIGNS:  Blood pressure 131/51, pulse 89, temperature 97.4 F (36.3 C), temperature source Oral, resp. rate 24, height 5\' 5"  (1.651 m), weight 76.1 kg (167 lb 12.3 oz), SpO2 100 %.  PHYSICAL EXAMINATION:  GENERAL:  81 y.o.-year-old patient lying in the bed with no acute distress.  EYES: Pupils equal, round, reactive to light and accommodation. No scleral icterus. Extraocular muscles intact.  HEENT: Head atraumatic, normocephalic. Oropharynx and nasopharynx clear.  NECK:  Supple, no jugular venous distention. No thyroid enlargement, no tenderness.  LUNGS: decreased breath sounds L>r, no wheezing, rales,rhonchi or crepitation. No use of accessory muscles of respiration.  CARDIOVASCULAR: S1, S2 normal. No murmurs, rubs, or gallops.  ABDOMEN: Soft, nontender, nondistended. Bowel sounds present. No organomegaly or mass.  EXTREMITIES: No pedal edema, cyanosis, or clubbing.  NEUROLOGIC: Cranial nerves II through XII are intact. Muscle strength 5/5 in all extremities. Sensation intact. Gait not checked.  PSYCHIATRIC: The patient is alert and oriented x 3.  SKIN: No obvious  rash, lesion, or ulcer. Chronic venous stasis changes in both Legs  LABORATORY PANEL:   CBC  Recent Labs Lab 12/11/14 1752  WBC 9.8  HGB 12.8  HCT 39.4  PLT 269   ------------------------------------------------------------------------------------------------------------------  Chemistries   Recent Labs Lab 12/11/14 1752  NA 136  K 3.5  CL 98*  CO2 27  GLUCOSE 307*  BUN 31*  CREATININE 2.56*  CALCIUM 8.2*  AST 36  ALT 23  ALKPHOS 97  BILITOT 0.4   ------------------------------------------------------------------------------------------------------------------  Cardiac Enzymes No results for input(s): TROPONINI in the last 168 hours. ------------------------------------------------------------------------------------------------------------------  RADIOLOGY:  Dg Chest 2 View  12/11/2014   CLINICAL DATA:  Acute shortness of breath with end-stage or disease, diabetes  EXAM: CHEST  2 VIEW  COMPARISON:  12/03/2013  FINDINGS: Complete opacification of the left hemi thorax compatible with a large left effusion and left lung collapse/ consolidation. Underlying left lung mass or adenopathy cannot be excluded. Heart is enlarged. Vascular congestion present in the right lung with a trace right effusion blunting the right costophrenic angle. No pneumothorax. Trachea is midline. Degenerative changes of  the spine and atherosclerosis of aorta. Bones are osteopenic. No compression fracture.  IMPRESSION: Complete left hemithorax opacification compatible with a large left effusion and left lung collapse/consolidation.   Electronically Signed   By: Judie Petit.  Shick M.D.   On: 12/11/2014 17:08   IMPRESSION AND PLAN:   81 year old Mrs. 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 She is being admitted with    #1 acute hypoxic respiratory failure suspected due to acute on chronic diastolic congestive heart failure with large left pleural effusion.   patient will be admitted on telemetry floor. We'll hold off on her Coumadin in the event patient undergoes for thoracentesis.   continue oxygen. Nebulizers as needed. We'll get diagnostic and therapeutic thoracentesis in the morning. Patient had echo done in July 2015 showed EF of 55-60% severely dilated left and right atrium. Severe tricuspid regurgitation severe mitral regurgitation severely increased left ventricle posterior wall thickness moderately elevated pulmonary artery systolic pressure. I will not repeat echo again. Lasix x 1 IV   #2 ESRD on hemodialysis nephrology consultation for inpatient hemodialysis.  #3 2 diabetes continue Lantus and sliding scale insulin.  #4 hyperlipidemia continue pravastatin.  #5 chronic A. fib on anticoagulation. We'll continue Cardizem. Hold off on Coumadin for now.   D/w pt and dter  All th narese records are reviewed and case discussed with ED provider. Management plans discussed with the patient, family and they are in agreement.  CODE STATUS: DNR (per dter)  TOTAL critical TIME TAKING CARE OF THIS PATIENT: 55 minutes.    Tanith Dagostino M.D on 12/11/2014 at 8:42 PM  Between 7am to 6pm - Pager - [phone]  After 6pm go to [email or web address] - password [email or web address] [facility]  [facility]  Office  [phone]  CC: Primary care physician; [name], MD

## 2014-12-11 NOTE — ED Notes (Signed)
MD at bedside. 

## 2014-12-11 NOTE — ED Provider Notes (Signed)
[facility] Emergency Department Provider Note  Time seen: 5:52 PM  I have reviewed the triage vital signs and the nursing notes.   HISTORY  Chief Complaint Shortness of Breath    HPI [name] is a 81 y.o. female with a past medical history of end-stage renal disease on hemodialysis Tuesday/Thursday/Saturday, diabetes, hypertension, CVA who presents to the emergency department with 1 week of shortness of breath. According to the patient for the past one week she has felt extremely short of breath especially with exertion or when lying flat. Her nephrologist ordered a chest x-ray which was performed today showing a left near-complete pleural effusion, and the patient was referred to the emergency department for further evaluation and treatment. Patient states she's had a pleural effusion in the past, but is never required drainage. Patient sees Dr. Thedore MinsSingh as her nephrologist. Patient describes her shortness breath is moderate, much worse with exertion or when lying flat. Denies any chest pain.     Past Medical History  Diagnosis Date  . Chronic kidney disease   . Dysrhythmia   . Diabetes mellitus without complication   . Hypertension   . Stroke   . Arthritis     gout  . Dialysis patient     There are no active problems to display for this patient.   Past Surgical History  Procedure Laterality Date  . Cholecystectomy    . Peripheral vascular catheterization N/A 10/08/2014    Procedure: A/V Shuntogram/Fistulagram;  Surgeon: Annice NeedyJason S Dew, MD;  Location: [facility] INVASIVE CV LAB;  Service: Cardiovascular;  Laterality: N/A;  . Joint replacement      bilateral hip    Current Outpatient Rx  Name  Route  Sig  Dispense  Refill  . acetaminophen (TYLENOL) 500 MG tablet   Oral   Take 500 mg by mouth every 6 (six) hours as needed for moderate pain.         Marland Kitchen. allopurinol (ZYLOPRIM) 100 MG tablet   Oral   Take 50 mg by mouth daily.         . Biotin 1 MG  CAPS   Oral   Take by mouth every morning.         . bisacodyl (DULCOLAX) 5 MG EC tablet   Oral   Take 5 mg by mouth daily as needed for moderate constipation.         Marland Kitchen. diltiazem (CARDIZEM CD) 240 MG 24 hr capsule   Oral   Take 240 mg by mouth daily.         . folic acid-vitamin b complex-vitamin c-selenium-zinc (DIALYVITE) 3 MG TABS tablet   Oral   Take 1 tablet by mouth daily.         . furosemide (LASIX) 80 MG tablet   Oral   Take 80 mg by mouth 2 (two) times daily.         Marland Kitchen. gabapentin (NEURONTIN) 300 MG capsule   Oral   Take 300 mg by mouth at bedtime.         . Insulin Detemir (LEVEMIR) 100 UNIT/ML Pen   Subcutaneous   Inject 10-14 Units into the skin daily at 10 pm.         . PARoxetine (PAXIL) 40 MG tablet   Oral   Take 40 mg by mouth every evening.         . pravastatin (PRAVACHOL) 20 MG tablet   Oral   Take 20 mg by mouth every  evening.         . warfarin (COUMADIN) 3 MG tablet   Oral   Take 3 mg by mouth daily.           Allergies Codeine; Oxycodone; and Quinine derivatives  No family history on file.  Social History History  Substance Use Topics  . Smoking status: Never Smoker   . Smokeless tobacco: Not on file  . Alcohol Use: No    Review of Systems Constitutional: Negative for fever. Cardiovascular: Negative for chest pain. Respiratory: Positive for shortness of breath. Gastrointestinal: Negative for abdominal pain, vomiting and diarrhea. Musculoskeletal: Negative for back pain. Neurological: Negative for headache 10-point ROS otherwise negative.  ____________________________________________   PHYSICAL EXAM:  VITAL SIGNS: ED Triage Vitals  Enc Vitals Group     BP 12/11/14 1710 120/55 mmHg     Pulse Rate 12/11/14 1710 90     Resp 12/11/14 1710 20     Temp 12/11/14 1710 97.4 F (36.3 C)     Temp Source 12/11/14 1710 Oral     SpO2 12/11/14 1710 91 %     Weight 12/11/14 1710 170 lb (77.111 kg)     Height  12/11/14 1710  (1.651 m)     Head Cir --      Peak Flow --      Pain Score 12/11/14 1717 0     Pain Loc --      Pain Edu? --      Excl. in GC? --     Constitutional: Alert and oriented. Well appearing and in no distress. Eyes: Normal exam ENT   Mouth/Throat: Mucous membranes are moist. Cardiovascular: Normal rate, regular rhythm. No murmur Respiratory: Normal respiratory effort without tachypnea nor retractions. Decreased breath sounds on the left side upper and lower. No obvious rales or rhonchi auscultated. No wheeze. Gastrointestinal: Soft and nontender. No distention.   Musculoskeletal: Nontender with normal range of motion in all extremities. Fistula to the right upper extremity, good thrill. Neurologic:  Normal speech and language. No gross focal neurologic deficits Skin:  Skin is warm, dry and intact.  Psychiatric: Mood and affect are normal. Speech and behavior are normal.   ____________________________________________   RADIOLOGY  Chest x-ray shows near complete white out of the left side, consistent with a very large pleural effusion.  ____________________________________________   INITIAL IMPRESSION / ASSESSMENT AND PLAN / ED COURSE  Pertinent labs & imaging results that were available during my care of the patient were reviewed by me and considered in my medical decision making (see chart for details).  Patient with large left pleural effusion on chest x-ray. No distress currently, but sats 90-91% on room air. Placed on 2 L of oxygen. Patient states her shortness breath is much worse with exertion or lying flat. We will obtain lab work, and admit for further treatment and likely drainage.  I discussed the patient with the hospitalist who will be admitting the patient for further evaluation and possible drainage.  ____________________________________________   FINAL CLINICAL IMPRESSION(S) / ED DIAGNOSES  Left pleural effusion   Minna Antis,  MD 12/11/14 1610

## 2014-12-11 NOTE — ED Notes (Signed)
Pt from dialysis today; MD there "heard fluid on her lung", sent for xray and was told to come here to get the fluid removed. Pt with hx of the same. Pt reports shortness of breath x1-2 weeks.

## 2014-12-12 ENCOUNTER — Inpatient Hospital Stay: Payer: Medicare Other

## 2014-12-12 LAB — BODY FLUID CELL COUNT WITH DIFFERENTIAL
Eos, Fluid: 1 %
Lymphs, Fluid: 95 %
MONOCYTE-MACROPHAGE-SEROUS FLUID: 1 %
NEUTROPHIL FLUID: 3 %
OTHER CELLS FL: 0 %
Total Nucleated Cell Count, Fluid: 3401 cu mm

## 2014-12-12 LAB — GLUCOSE, CAPILLARY
GLUCOSE-CAPILLARY: 77 mg/dL (ref 65–99)
GLUCOSE-CAPILLARY: 71 mg/dL (ref 65–99)
Glucose-Capillary: 143 mg/dL — ABNORMAL HIGH (ref 65–99)
Glucose-Capillary: 198 mg/dL — ABNORMAL HIGH (ref 65–99)

## 2014-12-12 LAB — LACTATE DEHYDROGENASE, PLEURAL OR PERITONEAL FLUID: LD, Fluid: 120 U/L — ABNORMAL HIGH (ref 3–23)

## 2014-12-12 LAB — PROTIME-INR
INR: 1.97
Prothrombin Time: 22.6 seconds — ABNORMAL HIGH (ref 11.4–15.0)

## 2014-12-12 LAB — PROTEIN, BODY FLUID: Total protein, fluid: 4.9 g/dL

## 2014-12-12 NOTE — Progress Notes (Signed)
Subjective:   Patient was admitted yesterday from dialysis for shortness of breath despite fluid removal.  Chest x-ray showed left lung whiteout therefore she was admitted for evaluation and management.  She underwent a CT of the chest without contrast.  She also underwent thoracentesis.  Overall, she feels better now  Objective:  Vital signs in last 24 hours:  Temp:  [97.4 F (36.3 C)-97.7 F (36.5 C)] 97.5 F (36.4 C) (07/13 1142) Pulse Rate:  [62-95] 83 (07/13 1142) Resp:  [16-24] 18 (07/13 1142) BP: (116-143)/(49-70) 119/51 mmHg (07/13 1142) SpO2:  [91 %-100 %] 100 % (07/13 1142) Weight:  [75.705 kg (166 lb 14.4 oz)-77.111 kg (170 lb)] 75.705 kg (166 lb 14.4 oz) (07/12 2205)  Weight change:  Filed Weights   12/11/14 1710 12/11/14 1731 12/11/14 2205  Weight: 77.111 kg (170 lb) 76.1 kg (167 lb 12.3 oz) 75.705 kg (166 lb 14.4 oz)    Intake/Output:       Physical Exam: General: No acute distress, laying in the bed  HEENT moist mucous membranes  Neck supple  Pulm/lungs Decreased breath sounds, left lung crackles  CVS/Heart no rub or gallop  Abdomen:  Soft, nontender  Extremities: No peripheral edema  Neurologic: Alert, nonfocal  Skin: No acute rashes  Access: ight arm AV fistula       Basic Metabolic Panel:  Recent Labs Lab 12/11/14 1752  NA 136  K 3.5  CL 98*  CO2 27  GLUCOSE 307*  BUN 31*  CREATININE 2.56*  CALCIUM 8.2*     CBC:  Recent Labs Lab 12/11/14 1752  WBC 9.8  HGB 12.8  HCT 39.4  MCV 95.5  PLT 269      Microbiology: No results found for this or any previous visit.  Coagulation Studies:  Recent Labs  12/11/14 1752 12/12/14 0357  LABPROT 23.0* 22.6*  INR 2.02 1.97    Urinalysis: No results for input(s): COLORURINE, LABSPEC, PHURINE, GLUCOSEU, HGBUR, BILIRUBINUR, KETONESUR, PROTEINUR, UROBILINOGEN, NITRITE, LEUKOCYTESUR in the last 72 hours.  Invalid input(s): APPERANCEUR    Imaging: Dg Chest 2 View  12/11/2014    CLINICAL DATA:  Acute shortness of breath with end-stage or disease, diabetes  EXAM: CHEST  2 VIEW  COMPARISON:  12/03/2013  FINDINGS: Complete opacification of the left hemi thorax compatible with a large left effusion and left lung collapse/ consolidation. Underlying left lung mass or adenopathy cannot be excluded. Heart is enlarged. Vascular congestion present in the right lung with a trace right effusion blunting the right costophrenic angle. No pneumothorax. Trachea is midline. Degenerative changes of the spine and atherosclerosis of aorta. Bones are osteopenic. No compression fracture.  IMPRESSION: Complete left hemithorax opacification compatible with a large left effusion and left lung collapse/consolidation.   Electronically Signed   By: Judie Petit.  Shick M.D.   On: 12/11/2014 17:08   Ct Chest Wo Contrast  12/12/2014   CLINICAL DATA:  Shortness of breath.  EXAM: CT CHEST WITHOUT CONTRAST  TECHNIQUE: Multidetector CT imaging of the chest was performed following the standard protocol without IV contrast.  COMPARISON:  12/11/2014 radiograph  FINDINGS: Mediastinum/Nodes: Hypodensity along the right side of thyroid gland, I am uncertain whether this is a thyroid nodule or a prominent internal jugular vein, cross-sectional measurement 2.8 by 2.6 cm image 6 series 2.  Coronary, aortic arch, and branch vessel atherosclerotic vascular disease. Mild cardiomegaly. Subcarinal node 1.4 cm short axis. Lower right paratracheal node 1.1 cm short axis, image 18 series 2. Left upper mediastinal lymph  node 1.0 cm in short axis image 8 series 2.  Dense calcification along the mitral valve.  Lungs/Pleura: Right middle lobe pulmonary nodule along the right hemidiaphragmatic margin, 1.1 by 1.0 cm on image 40 series 3. Several adjacent right middle lobe pulmonary nodules include a 0.8 by 0.5 cm right middle lobe nodule, image 35 series 3.  Mild mosaic attenuation in both lungs. Small bilateral pleural effusions, larger on the left than  the right, with associated passive atelectasis.  Faint interstitial accentuation in both lungs.  Upper abdomen: High density in the right renal collecting system favoring staghorn calculi in the upper pole.  Musculoskeletal: Degenerative glenohumeral arthropathy bilaterally.  Thoracic spondylosis. Broad Schmorl's node along the inferior endplate of L2.  IMPRESSION: 1. Subpleural nodule along in the right middle lobe along the right hemidiaphragm. The location would make this difficult to biopsy. Nuclear medicine PET-CT may be helpful in further workup, if clinically warranted. There several additional small nodules in the right middle lobe. 2. Mosaic attenuation in the lungs, suspicious for mild interstitial edema. Small bilateral pleural effusions. 3. Coronary, aortic arch, and branch vessel atherosclerotic vascular disease. Mild cardiomegaly. 4. Staghorn calculus, right kidney upper pole. 5. Dense calcification of the mitral valve. 6. Hypodensity along the right side of the thyroid gland. Given the lack of IV contrast I am uncertain whether this is a thyroid nodule measuring up to 2.8 cm, or just a prominent jugular vein. This could be differentiated with thyroid sonography if clinically warranted.   Electronically Signed   By: Gaylyn RongWalter  Liebkemann M.D.   On: 12/12/2014 12:01   [place] Thoracentesis Asp Pleural Space W/img Guide  12/12/2014   CLINICAL DATA:  Left pleural effusion.  EXAM: ULTRASOUND GUIDED left THORACENTESIS  COMPARISON:  December 11, 2014.  PROCEDURE: An ultrasound guided thoracentesis was thoroughly discussed with the patient and questions answered. The benefits, risks, alternatives and complications were also discussed. The patient understands and wishes to proceed with the procedure. Written consent was obtained.  Ultrasound was performed to localize and mark an adequate pocket of fluid in the left chest. The area was then prepped and draped in the normal sterile fashion. 1% Lidocaine was used for  local anesthesia. Under ultrasound guidance a Safe-T-Centesis catheter was introduced. Thoracentesis was performed. The catheter was removed and a dressing applied.  Complications:  None immediate.  FINDINGS: A total of approximately 2 L of serosanguineous fluid was removed. A fluid sample wassent for laboratory analysis.  IMPRESSION: Successful ultrasound guided left thoracentesis yielding 2 L of pleural fluid.   Electronically Signed   By: Lupita RaiderJames  Green Jr, M.D.   On: 12/12/2014 11:32     Medications:     . acetaminophen  650 mg Oral BID  . allopurinol  50 mg Oral Daily  . diltiazem  240 mg Oral Daily  . furosemide  80 mg Oral BID  . gabapentin  300 mg Oral QHS  . insulin aspart  0-9 Units Subcutaneous TID WC  . insulin detemir  10 Units Subcutaneous Q2200  . multivitamin with minerals  1 tablet Oral Daily  . pantoprazole  40 mg Oral Daily  . PARoxetine  40 mg Oral QPM  . pravastatin  20 mg Oral QPM   acetaminophen **OR** acetaminophen, bisacodyl, HYDROcodone-acetaminophen, ondansetron **OR** ondansetron (ZOFRAN) IV, sodium chloride  Assessment/ Plan:  81 y.o. female with end-stage renal disease, diabetes, hypertension, stroke, gout  1.  End-stage renal disease.  [facility]. Tuesday/Thursday/Saturday 2. Anemia of chronic kidney disease  3. Secondly hyperparathyroidism 4.  Lung nodule and pleural effusion.  Status post thoracentesis PLAN: Dialysis tomorrow Agree with pulmonary consult Agree with PET-CT     LOS: 1 Emiliano Welshans 7/13/20162:16 PM

## 2014-12-12 NOTE — Procedures (Signed)
Under US guidance, left thoracentesis was performed. No immediate complications. 

## 2014-12-12 NOTE — Care Management Note (Signed)
[facility]  TTS schedule.  I will send updated records to clinic at discharge. Ivor ReiningKim Deronda Christian [phone]

## 2014-12-12 NOTE — Progress Notes (Signed)
PT Cancellation Note  Patient Details Name: [name] MRN: [id number] DOB: Jan 22, 1934   Cancelled Treatment:    Reason Eval/Treat Not Completed: Patient at procedure or test/unavailable.  Will check later today.   Ivar DrapeStout, Sueann Brownley E 12/12/2014, 10:35 AM   Samul Dadauth Briarrose Shor, PT MS Acute Rehab Dept. Number: [facility] [phone] and [facility] [phone]

## 2014-12-12 NOTE — Evaluation (Signed)
Physical Therapy Evaluation Patient Details Name: [name] MRN: [id number] DOB: [birth year] Today's Date: 12/12/2014   History of Present Illness  81 yo female with onset of  L pleural effusion with recent NSTEMI, venous congestion and cardiomegaly.  PMHx:  CABG, Stroke, THA RLE   Clinical Impression  Pt was seen for assessment of her dizziness and issues with not walking any more. Her BP was quite low in standing and did note a nearly buckling appearance of LE's with effort.  Her plan is to continue rehab in SNF if possible to increase her LE strength and control of standing to allow more than bed to chair transfers.    Follow Up Recommendations SNF    Equipment Recommendations  None recommended by PT    Recommendations for Other Services       Precautions / Restrictions Precautions Precautions: Fall (telemetry) Restrictions Weight Bearing Restrictions: No      Mobility  Bed Mobility Overal bed mobility: Needs Assistance Bed Mobility: Supine to Sit;Sit to Supine     Supine to sit: Min assist Sit to supine: Min assist   General bed mobility comments: assist to help due to recent thoracotomy and R shoulder pain  Transfers Overall transfer level: Needs assistance Equipment used: Rolling walker (2 wheeled);1 person hand held assist Transfers: Sit to/from Stand Sit to Stand: Min assist;Mod assist         General transfer comment: assist to stand with reminders for hand placement  Ambulation/Gait             General Gait Details: Has not walked in a year due to dizziness   Stairs            Wheelchair Mobility    Modified Rankin (Stroke Patients Only)       Balance Overall balance assessment: Needs assistance   Sitting balance-Leahy Scale: Fair       Standing balance-Leahy Scale: Poor                               Pertinent Vitals/Pain Pain Assessment: 0-10 Pain Score: 7  Pain Location: R shoulder and ribcage Pain  Intervention(s): Limited activity within patient's tolerance;Monitored during session;Repositioned;Premedicated before session    Home Living Family/patient expects to be discharged to:: Private residence Living Arrangements: Children Available Help at Discharge: Family Type of Home: House Home Access: Stairs to enter              Prior Function Level of Independence: Independent with assistive device(s)               Hand Dominance        Extremity/Trunk Assessment   Upper Extremity Assessment: Overall WFL for tasks assessed           Lower Extremity Assessment: Generalized weakness (Not dramatic weakness but related to hips 4+)      Cervical / Trunk Assessment: Normal  Communication   Communication: No difficulties  Cognition Arousal/Alertness: Awake/alert Behavior During Therapy: WFL for tasks assessed/performed Overall Cognitive Status: Within Functional Limits for tasks assessed                      General Comments General comments (skin integrity, edema, etc.): Pt has BP of 115/56 sitting and standing 97/46 with feeling of unsteadiness.  States she has not been ck'd for BP standing but issues of unsteadiness that keep her from walking have been assessed for  years.  No nystagmus noted with any movement.    Exercises        Assessment/Plan    PT Assessment Patient needs continued PT services  PT Diagnosis Difficulty walking   PT Problem List Decreased strength;Decreased range of motion;Decreased activity tolerance;Decreased balance;Decreased mobility;Decreased coordination;Cardiopulmonary status limiting activity;Pain  PT Treatment Interventions DME instruction;Gait training;Stair training;Functional mobility training;Therapeutic activities;Therapeutic exercise;Balance training;Neuromuscular re-education;Patient/family education   PT Goals (Current goals can be found in the Care Plan section) Acute Rehab PT Goals Patient Stated Goal: to be  home and try to walk again PT Goal Formulation: With patient Time For Goal Achievement: 12/26/14 Potential to Achieve Goals: Good    Frequency Min 2X/week   Barriers to discharge Inaccessible home environment;Decreased caregiver support      Co-evaluation               End of Session Equipment Utilized During Treatment: Gait belt;Oxygen Activity Tolerance: Patient tolerated treatment well;Patient limited by fatigue Patient left: in bed;with call bell/phone within reach;with bed alarm set Nurse Communication: Mobility status         Time: 1434-1510 PT Time Calculation (min) (ACUTE ONLY): 36 min   Charges:   PT Evaluation $Initial PT Evaluation Tier I: 1 Procedure PT Treatments $Therapeutic Activity: 8-22 mins   PT G Codes:        Ivar DrapeStout, Braiden Rodman E 12/12/2014, 5:52 PM   Samul Dadauth Eyva Califano, PT MS Acute Rehab Dept. Number: [facility] [phone] and [facility] [phone]

## 2014-12-12 NOTE — Progress Notes (Signed)
[facility] -  at [facility]   PATIENT NAME: [name]    MR#:  [id number]  DATE OF BIRTH:  Sep 13, 1933  SUBJECTIVE:  Feels better today. Appears tired  REVIEW OF SYSTEMS:   Review of Systems  Constitutional: Negative for fever, chills and weight loss.  HENT: Negative for ear discharge, ear pain and nosebleeds.   Eyes: Negative for blurred vision, pain and discharge.  Respiratory: Positive for shortness of breath. Negative for cough, sputum production, wheezing and stridor.   Cardiovascular: Negative for chest pain, palpitations, orthopnea and PND.  Gastrointestinal: Negative for nausea, vomiting, abdominal pain and diarrhea.  Genitourinary: Negative for urgency and frequency.  Musculoskeletal: Negative for back pain and joint pain.  Neurological: Positive for weakness. Negative for sensory change, speech change and focal weakness.  Psychiatric/Behavioral: Negative for depression. The patient is not nervous/anxious.   All other systems reviewed and are negative.  Tolerating Diet:yes  DRUG ALLERGIES:   Allergies  Allergen Reactions  . Codeine Other (See Comments)    confusion  . Oxycodone     Confusion  . Quinine Derivatives Rash    VITALS:  Blood pressure 119/51, pulse 83, temperature 97.5 F (36.4 C), temperature source Oral, resp. rate 18, height  (1.651 m), weight 75.705 kg (166 lb 14.4 oz), SpO2 100 %.  PHYSICAL EXAMINATION:   Physical Exam  GENERAL:  81 y.o.-year-old patient lying in the bed with no acute distress.  EYES: Pupils equal, round, reactive to light and accommodation. No scleral icterus. Extraocular muscles intact.  HEENT: Head atraumatic, normocephalic. Oropharynx and nasopharynx clear.  NECK:  Supple, no jugular venous distention. No thyroid enlargement, no tenderness.  LUNGS: decreasedl breath sounds bilaterally, no wheezing, rales, rhonchi. No use of accessory muscles of respiration.  CARDIOVASCULAR: S1, S2  normal. No murmurs, rubs, or gallops.  ABDOMEN: Soft, nontender, nondistended. Bowel sounds present. No organomegaly or mass.  EXTREMITIES: No cyanosis, clubbing or edema b/l.    NEUROLOGIC: Cranial nerves II through XII are intact. No focal Motor or sensory deficits b/l.   PSYCHIATRIC: patient is alert and oriented x 3.  SKIN: No obvious rash, lesion, or ulcer.    LABORATORY PANEL:   CBC  Recent Labs Lab 12/11/14 1752  WBC 9.8  HGB 12.8  HCT 39.4  PLT 269    Chemistries   Recent Labs Lab 12/11/14 1752  NA 136  K 3.5  CL 98*  CO2 27  GLUCOSE 307*  BUN 31*  CREATININE 2.56*  CALCIUM 8.2*  AST 36  ALT 23  ALKPHOS 97  BILITOT 0.4    Cardiac Enzymes No results for input(s): TROPONINI in the last 168 hours.  RADIOLOGY:  Dg Chest 2 View  12/11/2014   CLINICAL DATA:  Acute shortness of breath with end-stage or disease, diabetes  EXAM: CHEST  2 VIEW  COMPARISON:  12/03/2013  FINDINGS: Complete opacification of the left hemi thorax compatible with a large left effusion and left lung collapse/ consolidation. Underlying left lung mass or adenopathy cannot be excluded. Heart is enlarged. Vascular congestion present in the right lung with a trace right effusion blunting the right costophrenic angle. No pneumothorax. Trachea is midline. Degenerative changes of the spine and atherosclerosis of aorta. Bones are osteopenic. No compression fracture.  IMPRESSION: Complete left hemithorax opacification compatible with a large left effusion and left lung collapse/consolidation.   Electronically Signed   By: Judie Petit.  Shick M.D.   On: 12/11/2014 17:08   Ct Chest Wo Contrast  12/12/2014   CLINICAL DATA:  Shortness of breath.  EXAM: CT CHEST WITHOUT CONTRAST  TECHNIQUE: Multidetector CT imaging of the chest was performed following the standard protocol without IV contrast.  COMPARISON:  12/11/2014 radiograph  FINDINGS: Mediastinum/Nodes: Hypodensity along the right side of thyroid gland, I am  uncertain whether this is a thyroid nodule or a prominent internal jugular vein, cross-sectional measurement 2.8 by 2.6 cm image 6 series 2.  Coronary, aortic arch, and branch vessel atherosclerotic vascular disease. Mild cardiomegaly. Subcarinal node 1.4 cm short axis. Lower right paratracheal node 1.1 cm short axis, image 18 series 2. Left upper mediastinal lymph node 1.0 cm in short axis image 8 series 2.  Dense calcification along the mitral valve.  Lungs/Pleura: Right middle lobe pulmonary nodule along the right hemidiaphragmatic margin, 1.1 by 1.0 cm on image 40 series 3. Several adjacent right middle lobe pulmonary nodules include a 0.8 by 0.5 cm right middle lobe nodule, image 35 series 3.  Mild mosaic attenuation in both lungs. Small bilateral pleural effusions, larger on the left than the right, with associated passive atelectasis.  Faint interstitial accentuation in both lungs.  Upper abdomen: High density in the right renal collecting system favoring staghorn calculi in the upper pole.  Musculoskeletal: Degenerative glenohumeral arthropathy bilaterally.  Thoracic spondylosis. Broad Schmorl's node along the inferior endplate of L2.  IMPRESSION: 1. Subpleural nodule along in the right middle lobe along the right hemidiaphragm. The location would make this difficult to biopsy. Nuclear medicine PET-CT may be helpful in further workup, if clinically warranted. There several additional small nodules in the right middle lobe. 2. Mosaic attenuation in the lungs, suspicious for mild interstitial edema. Small bilateral pleural effusions. 3. Coronary, aortic arch, and branch vessel atherosclerotic vascular disease. Mild cardiomegaly. 4. Staghorn calculus, right kidney upper pole. 5. Dense calcification of the mitral valve. 6. Hypodensity along the right side of the thyroid gland. Given the lack of IV contrast I am uncertain whether this is a thyroid nodule measuring up to 2.8 cm, or just a prominent jugular vein.  This could be differentiated with thyroid sonography if clinically warranted.   Electronically Signed   By: Gaylyn Rong M.D.   On: 12/12/2014 12:01   US Thoracentesis Asp Pleural Space W/img Guide  12/12/2014   CLINICAL DATA:  Left pleural effusion.  EXAM: ULTRASOUND GUIDED left THORACENTESIS  COMPARISON:  December 11, 2014.  PROCEDURE: An ultrasound guided thoracentesis was thoroughly discussed with the patient and questions answered. The benefits, risks, alternatives and complications were also discussed. The patient understands and wishes to proceed with the procedure. Written consent was obtained.  Ultrasound was performed to localize and mark an adequate pocket of fluid in the left chest. The area was then prepped and draped in the normal sterile fashion. 1% Lidocaine was used for local anesthesia. Under ultrasound guidance a Safe-T-Centesis catheter was introduced. Thoracentesis was performed. The catheter was removed and a dressing applied.  Complications:  None immediate.  FINDINGS: A total of approximately 2 L of serosanguineous fluid was removed. A fluid sample wassent for laboratory analysis.  IMPRESSION: Successful ultrasound guided left thoracentesis yielding 2 L of pleural fluid.   Electronically Signed   By: Lupita Raider, M.D.   On: 12/12/2014 11:32     ASSESSMENT AND PLAN:   81 year old Mrs. Keller with past medical history of incisional disease on hemodialysis Tuesday Thursday Saturday, chronic atrial fibrillation on Coumadin, chronic diastolic congestive heart failure on home oxygen at nighttime comes  to the emergency room accompanied by family members with increasing shortness of breath progressively getting worse over 1 week. She is being admitted with   #1 acute hypoxic respiratory failure suspected due to acute on chronic diastolic congestive heart failure with large left pleural effusion.  -s/p left sided thoracentesis and removal of 2 liter fluid. Fluid results  pending -CT chest shows right sided pleural and lung nodules. Will get pulmonary consult and order PET scan for tomorrow -resume Coumadin now since thoracentesis.m is completed -continue oxygen. Nebulizers as needed. -Patient had echo done in July 2015 showed EF of 55-60% severely dilated left and right atrium. Severe tricuspid regurgitation severe mitral regurgitation severely increased left ventricle posterior wall thickness moderately elevated pulmonary artery systolic pressure.  will not repeat echo again. -Cont lasix 80 mg home dose  #2 ESRD on hemodialysis nephrology consultation for inpatient hemodialysis.  #3 2 diabetes continue Lantus and sliding scale insulin.  #4 hyperlipidemia continue pravastatin.  #5 chronic A. fib on anticoagulation. We'll continue Cardizem. Hold off on Coumadin for now.   D/w pt and dter  Case discussed with Care Management/Social Worker. Management plans discussed with the patient, family and they are in agreement.  CODE STATUS: DNR  DVT Prophylaxis: heparin  TOTAL TIME TAKING CARE OF THIS PATIENT: 35minutes.  >50% time spent on counselling and coordination of care  POSSIBLE D/C IN 2-3 DAYS, DEPENDING ON CLINICAL CONDITION.   Amneet Cendejas M.D on 12/12/2014 at 12:30 PM  Between 7am to 6pm - Pager - [phone]  After 6pm go to [email or web address] - password [email or web address] [email or web address]  [facility]  Office  [phone]  CC: Primary care physician; Rolm GalaGRANDIS, HEIDI, MD

## 2014-12-12 NOTE — Care Management (Signed)
Patient presents from home.  Her daughter Elita Quickam lives with her and provides support.  Patient has a travel chair and is in the process of obtaining a Location managerJazzi motorized scooter.  Her daughter assists with bath.    Found to have a very large left pleural effusion.  Patient presented one year ago with similar sx and stated that extra lasix and dialysis took care of it.  [facility] has ESRD and [facility]    Dialysis coordinator informed of admission.  Patient has nocturnal 02 through [facility].  Discussed the need to assess for need of continuous 02 during progression.  Patient would be agreeable to home health and has no agency preference.  She is scheduled for thoracenthesis today

## 2014-12-13 LAB — GLUCOSE, CAPILLARY
GLUCOSE-CAPILLARY: 245 mg/dL — AB (ref 65–99)
Glucose-Capillary: 141 mg/dL — ABNORMAL HIGH (ref 65–99)

## 2014-12-13 LAB — BASIC METABOLIC PANEL
ANION GAP: 14 (ref 5–15)
BUN: 56 mg/dL — ABNORMAL HIGH (ref 6–20)
CHLORIDE: 94 mmol/L — AB (ref 101–111)
CO2: 25 mmol/L (ref 22–32)
Calcium: 8.2 mg/dL — ABNORMAL LOW (ref 8.9–10.3)
Creatinine, Ser: 3.77 mg/dL — ABNORMAL HIGH (ref 0.44–1.00)
GFR calc non Af Amer: 10 mL/min — ABNORMAL LOW (ref >60)
GFR, EST AFRICAN AMERICAN: 12 mL/min — AB (ref >60)
GLUCOSE: 222 mg/dL — AB (ref 65–99)
POTASSIUM: 3.7 mmol/L (ref 3.5–5.1)
Sodium: 133 mmol/L — ABNORMAL LOW (ref 135–145)

## 2014-12-13 LAB — CBC
HCT: 35.8 % (ref 35.0–47.0)
Hemoglobin: 11.9 g/dL — ABNORMAL LOW (ref 12.0–16.0)
MCH: 31.4 pg (ref 26.0–34.0)
MCHC: 33.3 g/dL (ref 32.0–36.0)
MCV: 94.3 fL (ref 80.0–100.0)
Platelets: 243 10*3/uL (ref 150–440)
RBC: 3.79 MIL/uL — ABNORMAL LOW (ref 3.80–5.20)
RDW: 15.2 % — ABNORMAL HIGH (ref 11.5–14.5)
WBC: 11.2 10*3/uL — ABNORMAL HIGH (ref 3.6–11.0)

## 2014-12-13 LAB — PATHOLOGIST SMEAR REVIEW

## 2014-12-13 LAB — CYTOLOGY - NON PAP

## 2014-12-13 MED ORDER — ACETAMINOPHEN 325 MG PO TABS
650.0000 mg | ORAL_TABLET | Freq: Two times a day (BID) | ORAL | Status: DC
  Administered 2014-12-13: 650 mg via ORAL

## 2014-12-13 MED ORDER — WARFARIN - PHYSICIAN DOSING INPATIENT: Freq: Every day | Status: DC

## 2014-12-13 MED ORDER — WARFARIN SODIUM 3 MG PO TABS
3.0000 mg | ORAL_TABLET | Freq: Every day | ORAL | Status: DC
  Administered 2014-12-13: 3 mg via ORAL
  Filled 2014-12-13: qty 1

## 2014-12-13 NOTE — Progress Notes (Signed)
Subjective:   Patient was admitted rom dialysis for shortness of breath despite fluid removal.  Chest x-ray showed left lung whiteout therefore she was admitted for evaluation and management.  She underwent a CT of the chest without contrast.  She also underwent thoracentesis.  Overall, she feels better now. Her breathing has improved. Lung nodules were seen in the CT but they're deemed to be chronic as she had nodules on previous CTs also.  Objective:  Vital signs in last 24 hours:  Temp:  [97.5 F (36.4 C)-97.9 F (36.6 C)] 97.7 F (36.5 C) (07/14 1030) Pulse Rate:  [67-84] 77 (07/14 1030) Resp:  [13-20] 19 (07/14 1130) BP: (117-132)/(54-71) 129/61 mmHg (07/14 1130) SpO2:  [92 %-100 %] 92 % (07/14 1130)  Weight change:  Filed Weights   12/11/14 1710 12/11/14 1731 12/11/14 2205  Weight: 77.111 kg (170 lb) 76.1 kg (167 lb 12.3 oz) 75.705 kg (166 lb 14.4 oz)    Intake/Output: I/O last 3 completed shifts: In: 100 [P.O.:100] Out: 450 [Urine:450]     Physical Exam: General: No acute distress, laying in the bed  HEENT moist mucous membranes  Neck supple  Pulm/lungs Decreased breath sounds, left lung crackles  CVS/Heart no rub or gallop  Abdomen:  Soft, nontender  Extremities: No peripheral edema  Neurologic: Alert, nonfocal  Skin: No acute rashes  Access: ight arm AV fistula       Basic Metabolic Panel:  Recent Labs Lab 12/11/14 1752 12/13/14 1055  NA 136 133*  K 3.5 3.7  CL 98* 94*  CO2 27 25  GLUCOSE 307* 222*  BUN 31* 56*  CREATININE 2.56* 3.77*  CALCIUM 8.2* 8.2*     CBC:  Recent Labs Lab 12/11/14 1752 12/13/14 1055  WBC 9.8 11.2*  HGB 12.8 11.9*  HCT 39.4 35.8  MCV 95.5 94.3  PLT 269 243      Microbiology: Results for orders placed or performed during the hospital encounter of 12/11/14  Body fluid culture     Status: None (Preliminary result)   Collection Time: 12/12/14 10:50 AM  Result Value Ref Range Status   Specimen Description  PLEURAL  Final   Special Requests NONE  Final   Gram Stain PENDING  Incomplete   Culture NO GROWTH < 24 HOURS  Final   Report Status PENDING  Incomplete    Coagulation Studies:  Recent Labs  12/11/14 1752 12/12/14 0357  LABPROT 23.0* 22.6*  INR 2.02 1.97    Urinalysis: No results for input(s): COLORURINE, LABSPEC, PHURINE, GLUCOSEU, HGBUR, BILIRUBINUR, KETONESUR, PROTEINUR, UROBILINOGEN, NITRITE, LEUKOCYTESUR in the last 72 hours.  Invalid input(s): APPERANCEUR    Imaging: Dg Chest 2 View  12/11/2014   CLINICAL DATA:  Acute shortness of breath with end-stage or disease, diabetes  EXAM: CHEST  2 VIEW  COMPARISON:  12/03/2013  FINDINGS: Complete opacification of the left hemi thorax compatible with a large left effusion and left lung collapse/ consolidation. Underlying left lung mass or adenopathy cannot be excluded. Heart is enlarged. Vascular congestion present in the right lung with a trace right effusion blunting the right costophrenic angle. No pneumothorax. Trachea is midline. Degenerative changes of the spine and atherosclerosis of aorta. Bones are osteopenic. No compression fracture.  IMPRESSION: Complete left hemithorax opacification compatible with a large left effusion and left lung collapse/consolidation.   Electronically Signed   By: Judie Petit.  Shick M.D.   On: 12/11/2014 17:08   Ct Chest Wo Contrast  12/12/2014   CLINICAL DATA:  Shortness of  breath.  EXAM: CT CHEST WITHOUT CONTRAST  TECHNIQUE: Multidetector CT imaging of the chest was performed following the standard protocol without IV contrast.  COMPARISON:  12/11/2014 radiograph  FINDINGS: Mediastinum/Nodes: Hypodensity along the right side of thyroid gland, I am uncertain whether this is a thyroid nodule or a prominent internal jugular vein, cross-sectional measurement 2.8 by 2.6 cm image 6 series 2.  Coronary, aortic arch, and branch vessel atherosclerotic vascular disease. Mild cardiomegaly. Subcarinal node 1.4 cm short axis.  Lower right paratracheal node 1.1 cm short axis, image 18 series 2. Left upper mediastinal lymph node 1.0 cm in short axis image 8 series 2.  Dense calcification along the mitral valve.  Lungs/Pleura: Right middle lobe pulmonary nodule along the right hemidiaphragmatic margin, 1.1 by 1.0 cm on image 40 series 3. Several adjacent right middle lobe pulmonary nodules include a 0.8 by 0.5 cm right middle lobe nodule, image 35 series 3.  Mild mosaic attenuation in both lungs. Small bilateral pleural effusions, larger on the left than the right, with associated passive atelectasis.  Faint interstitial accentuation in both lungs.  Upper abdomen: High density in the right renal collecting system favoring staghorn calculi in the upper pole.  Musculoskeletal: Degenerative glenohumeral arthropathy bilaterally.  Thoracic spondylosis. Broad Schmorl's node along the inferior endplate of L2.  IMPRESSION: 1. Subpleural nodule along in the right middle lobe along the right hemidiaphragm. The location would make this difficult to biopsy. Nuclear medicine PET-CT may be helpful in further workup, if clinically warranted. There several additional small nodules in the right middle lobe. 2. Mosaic attenuation in the lungs, suspicious for mild interstitial edema. Small bilateral pleural effusions. 3. Coronary, aortic arch, and branch vessel atherosclerotic vascular disease. Mild cardiomegaly. 4. Staghorn calculus, right kidney upper pole. 5. Dense calcification of the mitral valve. 6. Hypodensity along the right side of the thyroid gland. Given the lack of IV contrast I am uncertain whether this is a thyroid nodule measuring up to 2.8 cm, or just a prominent jugular vein. This could be differentiated with thyroid sonography if clinically warranted.   Electronically Signed   By: Gaylyn Rong M.D.   On: 12/12/2014 12:01   US Thoracentesis Asp Pleural Space W/img Guide  12/12/2014   CLINICAL DATA:  Left pleural effusion.  EXAM:  ULTRASOUND GUIDED left THORACENTESIS  COMPARISON:  December 11, 2014.  PROCEDURE: An ultrasound guided thoracentesis was thoroughly discussed with the patient and questions answered. The benefits, risks, alternatives and complications were also discussed. The patient understands and wishes to proceed with the procedure. Written consent was obtained.  Ultrasound was performed to localize and mark an adequate pocket of fluid in the left chest. The area was then prepped and draped in the normal sterile fashion. 1% Lidocaine was used for local anesthesia. Under ultrasound guidance a Safe-T-Centesis catheter was introduced. Thoracentesis was performed. The catheter was removed and a dressing applied.  Complications:  None immediate.  FINDINGS: A total of approximately 2 L of serosanguineous fluid was removed. A fluid sample wassent for laboratory analysis.  IMPRESSION: Successful ultrasound guided left thoracentesis yielding 2 L of pleural fluid.   Electronically Signed   By: Lupita Raider, M.D.   On: 12/12/2014 11:32     Medications:     . acetaminophen  650 mg Oral BID  . allopurinol  50 mg Oral Daily  . diltiazem  240 mg Oral Daily  . furosemide  80 mg Oral BID  . gabapentin  300 mg Oral QHS  .  insulin aspart  0-9 Units Subcutaneous TID WC  . insulin detemir  10 Units Subcutaneous Q2200  . multivitamin with minerals  1 tablet Oral Daily  . pantoprazole  40 mg Oral Daily  . PARoxetine  40 mg Oral QPM  . pravastatin  20 mg Oral QPM  . warfarin  3 mg Oral Daily  . Warfarin - Physician Dosing Inpatient   Does not apply q1800   acetaminophen **OR** acetaminophen, bisacodyl, HYDROcodone-acetaminophen, ondansetron **OR** ondansetron (ZOFRAN) IV, sodium chloride  Assessment/ Plan:  81 y.o. female with end-stage renal disease, diabetes, hypertension, stroke, gout  1.  End-stage renal disease.  [employer] [place]. Tuesday/Thursday/Saturday 2. Anemia of chronic kidney disease 3. Secondly  hyperparathyroidism 4.  Lung nodule and pleural effusion.  Status post thoracentesis PLAN:  patient to be discharged to home later today She will follow-up with Dr. Carolynn CommentSadaat Khan/pulmonary as outpatient for evaluation and management of pulmonary nodules [facility].    LOS: 2 [name] 7/14/201612:04 PM

## 2014-12-13 NOTE — Progress Notes (Signed)
Physical Therapy Treatment Patient Details Name: [name] MRN: [id number] DOB: [birth year] Today's Date: 12/13/2014    History of Present Illness 81 yo female with onset of  L pleural effusion with recent NSTEMI, venous congestion and cardiomegaly.  PMHx:  CABG, Stroke, THA RLE     PT Comments    Pt is having some concerns about low BP and did work on LE exercises with her.  Explained the physiology of hypotension and did ck with nursing, who reports MD wants to increase activity and not use meds now.  Pt in agreement with this care plan.  Advised her to go to outpatient when it is convenient.  Follow Up Recommendations  SNF     Equipment Recommendations  None recommended by PT    Recommendations for Other Services       Precautions / Restrictions Precautions Precautions: Fall Precaution Comments: telemetry, RUE fistula Restrictions Weight Bearing Restrictions: No    Mobility  Bed Mobility               General bed mobility comments: declined, just finished HD  Transfers                 General transfer comment: declined due to just completing HD  Ambulation/Gait             General Gait Details: Not able   Stairs            Wheelchair Mobility    Modified Rankin (Stroke Patients Only)       Balance                                    Cognition Arousal/Alertness: Awake/alert Behavior During Therapy: WFL for tasks assessed/performed Overall Cognitive Status: Within Functional Limits for tasks assessed                      Exercises General Exercises - Lower Extremity Ankle Circles/Pumps: AROM;Both;10 reps Quad Sets: AROM;Both;20 reps Gluteal Sets: AROM;Both;20 reps Heel Slides: AROM;Both;15 reps Hip ABduction/ADduction: AROM;Both;15 reps    General Comments General comments (skin integrity, edema, etc.): Pt is electing home over SNF but did discuss the difference in PT availability.  Her plan is  based [place] [facility] [place]      Pertinent Vitals/Pain Pain Assessment: No/denies pain    Home Living                      Prior Function            PT Goals (current goals can now be found in the care plan section) Acute Rehab PT Goals Patient Stated Goal: Home Progress towards PT goals: Progressing toward goals    Frequency  Min 2X/week    PT Plan Current plan remains appropriate    Co-evaluation             End of Session Equipment Utilized During Treatment: Oxygen Activity Tolerance: Patient tolerated treatment well;No increased pain Patient left: in bed;with call bell/phone within reach;with bed alarm set     Time: [id number] PT Time Calculation (min) (ACUTE ONLY): 23 min  Charges:  $Therapeutic Exercise: 23-37 mins                    G Codes:      Ivar DrapeStout, Romell Wolden E 12/13/2014, 3:30 PM   [place]  Lorenda Cahill, PT MS Acute Rehab Dept. Number: [facility] [phone] and [place] [phone]

## 2014-12-13 NOTE — Discharge Instructions (Signed)
HHPT<RN<AIDE  [facility] WILL PROVIDE HOME HEALTH NURSE PHYSICAL THERAPY AND AIDE   1 [phone]  [facility] SENT PORTABLE TANK OF OXYGEN FOR TRANSPORT HOME.  PATIENT SHOULD USE 02 CONTINUOUSLY FOR NOW

## 2014-12-13 NOTE — Progress Notes (Signed)
   12/13/14 1412 12/13/14 1430 12/13/14 1528  Vitals  Resp 16 17 --   Oxygen Therapy  SpO2 96 % --  95 %  O2 Device Room Air Nasal Cannula Room Air (at rest)  O2 Flow Rate (L/min) --  3 L/min --      12/13/14 1534 12/13/14 1536  Vitals  Resp --  --   Oxygen Therapy  SpO2 (!) 84 % 95 %  O2 Device Room Air (with exertion) Nasal Cannula (with exertion)  O2 Flow Rate (L/min) --  3 L/min

## 2014-12-13 NOTE — Discharge Summary (Signed)
[facility] -  at [facility]   PATIENT NAME: [name]    MR#:  [id number]  DATE OF BIRTH:  [birth year]  DATE OF ADMISSION:  12/11/2014 ADMITTING PHYSICIAN: Enedina Finner, MD  DATE OF DISCHARGE: 7/14/  PRIMARY CARE PHYSICIAN: [name], MD    ADMISSION DIAGNOSIS:  Dyspnea [R06.00] Pleural effusion [J90] Pleural effusion, left [J94.8]  DISCHARGE DIAGNOSIS:  Large Pleural Effusion s/p thoracentesis CHF acute on chornic diastolic, mild Pulmonary lung nodules (to be followed as outpt) ESRD on HD  SECONDARY DIAGNOSIS:   Past Medical History  Diagnosis Date  . Chronic kidney disease   . Dysrhythmia   . Diabetes mellitus without complication   . Hypertension   . Stroke   . Arthritis     gout  . Dialysis patient     HOSPITAL COURSE:  81 year old [name] with past medical history of incisional disease on hemodialysis Tuesday Thursday Saturday, chronic atrial fibrillation on Coumadin, chronic diastolic congestive heart failure on home oxygen at nighttime comes to the emergency room accompanied by family members with increasing shortness of breath progressively getting worse over 1 week. She is being admitted with   #1 acute hypoxic respiratory failure suspected due to acute on chronic diastolic congestive heart failure with large left pleural effusion.  -s/p left sided thoracentesis and removal of 2 liter fluid. Fluid results noted -CT chest shows right sided pleural and lung nodules.  -spoke with Dr Welton Flakes and will see pt as out pt next week. Family agreeable -resume Coumadin now since thoracentesis is completed -continue oxygen. Nebulizers as needed. -Patient had echo done in July 2015 showed EF of 55-60% severely dilated left and right atrium. Severe tricuspid regurgitation severe mitral regurgitation severely increased left ventricle posterior wall thickness moderately elevated pulmonary artery systolic pressure. will not repeat  echo again. -Cont lasix 80 mg home dose  #2 ESRD on hemodialysis nephrology consultation for inpatient hemodialysis.  #3 2 diabetes continue Lantus and sliding scale insulin.  #4 hyperlipidemia continue pravastatin.  #5 chronic A. fib on anticoagulation. We'll continue Cardizem. Hold off on Coumadin for now.   D/w pt and dter Overall better. PT recommends STR, pt is requesting to go home. Family supportive about it -d/c with HHPT Case discussed with Care Management/Social Worker. Management plans discussed with the patient, family and they are in agreement.  CODE STATUS: DNR  DVT Prophylaxis: heparin    DISCHARGE CONDITIONS:   fair  CONSULTS OBTAINED:  Treatment Team:  Mosetta Pigeon, MD Yevonne Pax, MD  DRUG ALLERGIES:   Allergies  Allergen Reactions  . Codeine Other (See Comments)    confusion  . Oxycodone     Confusion  . Quinine Derivatives Rash    DISCHARGE MEDICATIONS:   Current Discharge Medication List    CONTINUE these medications which have NOT CHANGED   Details  allopurinol (ZYLOPRIM) 100 MG tablet Take 50 mg by mouth daily.    Biotin 1 MG CAPS Take by mouth every morning.    bisacodyl (DULCOLAX) 5 MG EC tablet Take 5 mg by mouth daily as needed for moderate constipation.    diltiazem (CARDIZEM CD) 240 MG 24 hr capsule Take 240 mg by mouth daily.    esomeprazole (NEXIUM) 20 MG capsule Take 20 mg by mouth at bedtime.    folic acid-vitamin b complex-vitamin c-selenium-zinc (DIALYVITE) 3 MG TABS tablet Take 1 tablet by mouth daily.    furosemide (LASIX) 80 MG tablet Take 80 mg by mouth 2 (  two) times daily.    gabapentin (NEURONTIN) 300 MG capsule Take 300 mg by mouth at bedtime.    Insulin Detemir (LEVEMIR) 100 UNIT/ML Pen Inject 10-14 Units into the skin daily at 10 pm.    PARoxetine (PAXIL) 40 MG tablet Take 40 mg by mouth every evening.    pravastatin (PRAVACHOL) 20 MG tablet Take 20 mg by mouth every evening.    warfarin (COUMADIN)  3 MG tablet Take 3 mg by mouth daily. Except on sundays    acetaminophen (TYLENOL) 500 MG tablet Take 500 mg by mouth every 6 (six) hours as needed for moderate pain.      STOP taking these medications     acetaminophen (TYLENOL 8 HOUR ARTHRITIS PAIN) 650 MG CR tablet         If you experience worsening of your admission symptoms, develop shortness of breath, life threatening emergency, suicidal or homicidal thoughts you must seek medical attention immediately by calling 911 or calling your MD immediately  if symptoms less severe.  You Must read complete instructions/literature along with all the possible adverse reactions/side effects for all the Medicines you take and that have been prescribed to you. Take any new Medicines after you have completely understood and accept all the possible adverse reactions/side effects.   Please note  You were cared for by a hospitalist during your hospital stay. If you have any questions about your discharge medications or the care you received while you were in the hospital after you are discharged, you can call the unit and asked to speak with the hospitalist on call if the hospitalist that took care of you is not available. Once you are discharged, your primary care physician will handle any further medical issues. Please note that NO REFILLS for any discharge medications will be authorized once you are discharged, as it is imperative that you return to your primary care physician (or establish a relationship with a primary care physician if you do not have one) for your aftercare needs so that they can reassess your need for medications and monitor your lab values. Today   SUBJECTIVE   Feels better  VITAL SIGNS:  Blood pressure 132/65, pulse 67, temperature 97.5 F (36.4 C), temperature source Oral, resp. rate 20, height 5\' 5"  (1.651 m), weight 75.705 kg (166 lb 14.4 oz), SpO2 100 %.  I/O:   Intake/Output Summary (Last 24 hours) at 12/13/14  0954 Last data filed at 12/13/14 1610  Gross per 24 hour  Intake    100 ml  Output    550 ml  Net   -450 ml    PHYSICAL EXAMINATION:  GENERAL:  81 y.o.-year-old patient lying in the bed with no acute distress.  EYES: Pupils equal, round, reactive to light and accommodation. No scleral icterus. Extraocular muscles intact.  HEENT: Head atraumatic, normocephalic. Oropharynx and nasopharynx clear.  NECK:  Supple, no jugular venous distention. No thyroid enlargement, no tenderness.  LUNGS decreasde breath sounds bilaterally, no wheezing, rales,rhonchi or crepitation. No use of accessory muscles of respiration.  CARDIOVASCULAR: S1, S2 normal. No murmurs, rubs, or gallops.  ABDOMEN: Soft, non-tender, non-distended. Bowel sounds present. No organomegaly or mass.  EXTREMITIES: No pedal edema, cyanosis, or clubbing.  NEUROLOGIC: Cranial nerves II through XII are intact. Muscle strength 5/5 in all extremities. Sensation intact. Gait not checked.  PSYCHIATRIC: The patient is alert and oriented x 3.  SKIN: No obvious rash, lesion, or ulcer.   DATA REVIEW:   CBC   Recent  Labs Lab 12/11/14 1752  WBC 9.8  HGB 12.8  HCT 39.4  PLT 269    Chemistries   Recent Labs Lab 12/11/14 1752  NA 136  K 3.5  CL 98*  CO2 27  GLUCOSE 307*  BUN 31*  CREATININE 2.56*  CALCIUM 8.2*  AST 36  ALT 23  ALKPHOS 97  BILITOT 0.4    Microbiology Results   Recent Results (from the past 240 hour(s))  Body fluid culture     Status: None (Preliminary result)   Collection Time: 12/12/14 10:50 AM  Result Value Ref Range Status   Specimen Description PLEURAL  Final   Special Requests NONE  Final   Gram Stain PENDING  Incomplete   Culture NO GROWTH < 24 HOURS  Final   Report Status PENDING  Incomplete    RADIOLOGY:  Dg Chest 2 View  12/11/2014   CLINICAL DATA:  Acute shortness of breath with end-stage or disease, diabetes  EXAM: CHEST  2 VIEW  COMPARISON:  12/03/2013  FINDINGS: Complete  opacification of the left hemi thorax compatible with a large left effusion and left lung collapse/ consolidation. Underlying left lung mass or adenopathy cannot be excluded. Heart is enlarged. Vascular congestion present in the right lung with a trace right effusion blunting the right costophrenic angle. No pneumothorax. Trachea is midline. Degenerative changes of the spine and atherosclerosis of aorta. Bones are osteopenic. No compression fracture.  IMPRESSION: Complete left hemithorax opacification compatible with a large left effusion and left lung collapse/consolidation.   Electronically Signed   By: Judie Petit.  Shick M.D.   On: 12/11/2014 17:08   Ct Chest Wo Contrast  12/12/2014   CLINICAL DATA:  Shortness of breath.  EXAM: CT CHEST WITHOUT CONTRAST  TECHNIQUE: Multidetector CT imaging of the chest was performed following the standard protocol without IV contrast.  COMPARISON:  12/11/2014 radiograph  FINDINGS: Mediastinum/Nodes: Hypodensity along the right side of thyroid gland, I am uncertain whether this is a thyroid nodule or a prominent internal jugular vein, cross-sectional measurement 2.8 by 2.6 cm image 6 series 2.  Coronary, aortic arch, and branch vessel atherosclerotic vascular disease. Mild cardiomegaly. Subcarinal node 1.4 cm short axis. Lower right paratracheal node 1.1 cm short axis, image 18 series 2. Left upper mediastinal lymph node 1.0 cm in short axis image 8 series 2.  Dense calcification along the mitral valve.  Lungs/Pleura: Right middle lobe pulmonary nodule along the right hemidiaphragmatic margin, 1.1 by 1.0 cm on image 40 series 3. Several adjacent right middle lobe pulmonary nodules include a 0.8 by 0.5 cm right middle lobe nodule, image 35 series 3.  Mild mosaic attenuation in both lungs. Small bilateral pleural effusions, larger on the left than the right, with associated passive atelectasis.  Faint interstitial accentuation in both lungs.  Upper abdomen: High density in the right renal  collecting system favoring staghorn calculi in the upper pole.  Musculoskeletal: Degenerative glenohumeral arthropathy bilaterally.  Thoracic spondylosis. Broad Schmorl's node along the inferior endplate of L2.  IMPRESSION: 1. Subpleural nodule along in the right middle lobe along the right hemidiaphragm. The location would make this difficult to biopsy. Nuclear medicine PET-CT may be helpful in further workup, if clinically warranted. There several additional small nodules in the right middle lobe. 2. Mosaic attenuation in the lungs, suspicious for mild interstitial edema. Small bilateral pleural effusions. 3. Coronary, aortic arch, and branch vessel atherosclerotic vascular disease. Mild cardiomegaly. 4. Staghorn calculus, right kidney upper pole. 5. Dense calcification of the  mitral valve. 6. Hypodensity along the right side of the thyroid gland. Given the lack of IV contrast I am uncertain whether this is a thyroid nodule measuring up to 2.8 cm, or just a prominent jugular vein. This could be differentiated with thyroid sonography if clinically warranted.   Electronically Signed   By: Gaylyn RongWalter  Liebkemann M.D.   On: 12/12/2014 12:01   [place] Thoracentesis Asp Pleural Space W/img Guide  12/12/2014   CLINICAL DATA:  Left pleural effusion.  EXAM: ULTRASOUND GUIDED left THORACENTESIS  COMPARISON:  December 11, 2014.  PROCEDURE: An ultrasound guided thoracentesis was thoroughly discussed with the patient and questions answered. The benefits, risks, alternatives and complications were also discussed. The patient understands and wishes to proceed with the procedure. Written consent was obtained.  Ultrasound was performed to localize and mark an adequate pocket of fluid in the left chest. The area was then prepped and draped in the normal sterile fashion. 1% Lidocaine was used for local anesthesia. Under ultrasound guidance a Safe-T-Centesis catheter was introduced. Thoracentesis was performed. The catheter was removed and a  dressing applied.  Complications:  None immediate.  FINDINGS: A total of approximately 2 L of serosanguineous fluid was removed. A fluid sample wassent for laboratory analysis.  IMPRESSION: Successful ultrasound guided left thoracentesis yielding 2 L of pleural fluid.   Electronically Signed   By: Lupita RaiderJames  Green Jr, M.D.   On: 12/12/2014 11:32     Management plans discussed with the patient, family and they are in agreement.  CODE STATUS:     Code Status Orders        Start     Ordered   12/11/14 2247  Do not attempt resuscitation (DNR)   Continuous    Question Answer Comment  In the event of cardiac or respiratory ARREST Do not call a "code blue"   In the event of cardiac or respiratory ARREST Do not perform Intubation, CPR, defibrillation or ACLS   In the event of cardiac or respiratory ARREST Use medication by any route, position, wound care, and other measures to relive pain and suffering. May use oxygen, suction and manual treatment of airway obstruction as needed for comfort.      12/11/14 2246    Advance Directive Documentation        Most Recent Value   Type of Advance Directive  Healthcare Power of Attorney   Pre-existing out of facility DNR order (yellow form or pink MOST form)     "MOST" Form in Place?        TOTAL TIME TAKING CARE OF THIS PATIENT: 40 minutes.    Gertude Benito M.D on 12/13/2014 at 9:54 AM  Between 7am to 6pm - Pager - [phone] After 6pm go to [email or web address] - password [email or web address] [facility]  [facility]  Office  [phone]  CC: Primary care physician; Rolm GalaGRANDIS, HEIDI, MD

## 2014-12-13 NOTE — Care Management (Addendum)
Patient (and her daughter that is present is in agreement) wish to discharge home instead of skilled nursing.  Agreeable to home health nursing, physical therapy and aide.  Referral to [facility].  Room air resting sats  Are 93 - 96 on room air.  Primary nurse obtained room air sats on exertion and was 84%.  Called and faxed the order for continuous home 02 to [facility].  Portable tank will be delivered to patient's room.  Updated patient and primary nurse and [facility]

## 2014-12-13 NOTE — Care Management Important Message (Signed)
Important Message  Patient Details  Name: [name] MRN: [id number] Date of Birth: Aug 06, 1933   Medicare Important Message Given:  Yes-second notification given    Olegario MessierKathy A Allmond 12/13/2014, 9:47 AM

## 2014-12-13 NOTE — Progress Notes (Signed)
IV and tele discontinued. Discharge instructions given. Heart failure booklet given. No new medications. Oxygen tank has been delivered. Patient is awaiting her daughter to pick her up, no further questions at this time. Daughter is expected to arrive around 1800.

## 2014-12-13 NOTE — Progress Notes (Signed)
HD tx completed.

## 2014-12-14 LAB — PH, BODY FLUID: pH, Body Fluid: 7.5

## 2014-12-14 LAB — HEPATITIS C ANTIBODY: HCV Ab: 0.1 s/co ratio (ref 0.0–0.9)

## 2014-12-14 LAB — HEPATITIS B SURFACE ANTIGEN: HEP B S AG: NEGATIVE

## 2014-12-14 LAB — HEPATITIS B SURFACE ANTIBODY, QUANTITATIVE: Hepatitis B-Post: <3.1 m[IU]/mL — ABNORMAL LOW (ref >9.9)

## 2014-12-16 LAB — BODY FLUID CULTURE: Culture: NO GROWTH

## 2015-01-11 ENCOUNTER — Other Ambulatory Visit: Payer: Self-pay | Admitting: Internal Medicine

## 2015-01-11 ENCOUNTER — Ambulatory Visit
Admission: RE | Admit: 2015-01-11 | Discharge: 2015-01-11 | Disposition: A | Discharge status: Home / Self Care | Payer: Medicare Other | Source: Ambulatory Visit | Attending: Internal Medicine | Admitting: Internal Medicine

## 2015-01-11 DIAGNOSIS — R059 Cough, unspecified: Secondary | ICD-10-CM

## 2015-01-11 DIAGNOSIS — R918 Other nonspecific abnormal finding of lung field: Secondary | ICD-10-CM | POA: Insufficient documentation

## 2015-01-11 DIAGNOSIS — R05 Cough: Secondary | ICD-10-CM

## 2015-01-11 DIAGNOSIS — J90 Pleural effusion, not elsewhere classified: Secondary | ICD-10-CM | POA: Insufficient documentation

## 2015-01-11 DIAGNOSIS — R0602 Shortness of breath: Secondary | ICD-10-CM | POA: Diagnosis present

## 2015-03-04 ENCOUNTER — Ambulatory Visit
Admission: RE | Admit: 2015-03-04 | Discharge: 2015-03-04 | Disposition: A | Discharge status: Home / Self Care | Payer: Medicare Other | Source: Ambulatory Visit | Attending: Vascular Surgery | Admitting: Vascular Surgery

## 2015-03-04 ENCOUNTER — Encounter: Payer: Self-pay | Admitting: *Deleted

## 2015-03-04 ENCOUNTER — Encounter
Admission: RE | Disposition: A | Discharge status: Home / Self Care | Payer: Self-pay | Source: Ambulatory Visit | Attending: Vascular Surgery

## 2015-03-04 DIAGNOSIS — Y832 Surgical operation with anastomosis, bypass or graft as the cause of abnormal reaction of the patient, or of later complication, without mention of misadventure at the time of the procedure: Secondary | ICD-10-CM | POA: Insufficient documentation

## 2015-03-04 DIAGNOSIS — M109 Gout, unspecified: Secondary | ICD-10-CM | POA: Insufficient documentation

## 2015-03-04 DIAGNOSIS — Z992 Dependence on renal dialysis: Secondary | ICD-10-CM | POA: Diagnosis not present

## 2015-03-04 DIAGNOSIS — Z794 Long term (current) use of insulin: Secondary | ICD-10-CM | POA: Diagnosis not present

## 2015-03-04 DIAGNOSIS — F418 Other specified anxiety disorders: Secondary | ICD-10-CM | POA: Insufficient documentation

## 2015-03-04 DIAGNOSIS — Z7982 Long term (current) use of aspirin: Secondary | ICD-10-CM | POA: Diagnosis not present

## 2015-03-04 DIAGNOSIS — N186 End stage renal disease: Secondary | ICD-10-CM | POA: Insufficient documentation

## 2015-03-04 DIAGNOSIS — E1122 Type 2 diabetes mellitus with diabetic chronic kidney disease: Secondary | ICD-10-CM | POA: Diagnosis not present

## 2015-03-04 DIAGNOSIS — I499 Cardiac arrhythmia, unspecified: Secondary | ICD-10-CM | POA: Diagnosis not present

## 2015-03-04 DIAGNOSIS — I120 Hypertensive chronic kidney disease with stage 5 chronic kidney disease or end stage renal disease: Secondary | ICD-10-CM | POA: Insufficient documentation

## 2015-03-04 DIAGNOSIS — T82858A Stenosis of vascular prosthetic devices, implants and grafts, initial encounter: Secondary | ICD-10-CM | POA: Diagnosis not present

## 2015-03-04 DIAGNOSIS — Z79899 Other long term (current) drug therapy: Secondary | ICD-10-CM | POA: Insufficient documentation

## 2015-03-04 DIAGNOSIS — E7800 Pure hypercholesterolemia, unspecified: Secondary | ICD-10-CM | POA: Diagnosis not present

## 2015-03-04 HISTORY — PX: PERIPHERAL VASCULAR CATHETERIZATION: SHX172C

## 2015-03-04 LAB — BASIC METABOLIC PANEL
Anion gap: 15 (ref 5–15)
BUN: 66 mg/dL — ABNORMAL HIGH (ref 6–20)
CO2: 25 mmol/L (ref 22–32)
Calcium: 8.4 mg/dL — ABNORMAL LOW (ref 8.9–10.3)
Chloride: 98 mmol/L — ABNORMAL LOW (ref 101–111)
Creatinine, Ser: 4.6 mg/dL — ABNORMAL HIGH (ref 0.44–1.00)
GFR calc Af Amer: 10 mL/min — ABNORMAL LOW (ref >60)
GFR calc non Af Amer: 8 mL/min — ABNORMAL LOW (ref >60)
GLUCOSE: 76 mg/dL (ref 65–99)
POTASSIUM: 3.6 mmol/L (ref 3.5–5.1)
Sodium: 138 mmol/L (ref 135–145)

## 2015-03-04 LAB — PROTIME-INR
INR: 1.43
Prothrombin Time: 17.6 seconds — ABNORMAL HIGH (ref 11.4–15.0)

## 2015-03-04 SURGERY — A/V SHUNTOGRAM/FISTULAGRAM
Anesthesia: Moderate Sedation

## 2015-03-04 MED ORDER — ONDANSETRON HCL 4 MG/2ML IJ SOLN: 4.0000 mg | INTRAMUSCULAR | Status: DC | PRN

## 2015-03-04 MED ORDER — HEPARIN SODIUM (PORCINE) 1000 UNIT/ML IJ SOLN
INTRAMUSCULAR | Status: AC
  Filled 2015-03-04: qty 1

## 2015-03-04 MED ORDER — LIDOCAINE-EPINEPHRINE (PF) 1 %-1:200000 IJ SOLN
INTRAMUSCULAR | Status: AC
  Filled 2015-03-04: qty 30

## 2015-03-04 MED ORDER — LIDOCAINE-EPINEPHRINE (PF) 1 %-1:200000 IJ SOLN
INTRAMUSCULAR | Status: DC | PRN
  Administered 2015-03-04: 10 mL via INTRADERMAL

## 2015-03-04 MED ORDER — SODIUM CHLORIDE 0.9 % IV SOLN
INTRAVENOUS | Status: DC
  Administered 2015-03-04: 12:00:00 via INTRAVENOUS

## 2015-03-04 MED ORDER — MIDAZOLAM HCL 5 MG/5ML IJ SOLN
INTRAMUSCULAR | Status: AC
  Filled 2015-03-04: qty 5

## 2015-03-04 MED ORDER — MIDAZOLAM HCL 2 MG/2ML IJ SOLN
INTRAMUSCULAR | Status: DC | PRN
  Administered 2015-03-04: 2 mg via INTRAVENOUS

## 2015-03-04 MED ORDER — DEXTROSE 50 % IV SOLN: 0.5000 | Freq: Once | INTRAVENOUS | Status: DC | PRN

## 2015-03-04 MED ORDER — CEFAZOLIN SODIUM 1-5 GM-% IV SOLN
INTRAVENOUS | Status: AC
  Filled 2015-03-04: qty 50

## 2015-03-04 MED ORDER — FENTANYL CITRATE (PF) 100 MCG/2ML IJ SOLN
INTRAMUSCULAR | Status: DC | PRN
  Administered 2015-03-04 (×2): 50 ug via INTRAVENOUS

## 2015-03-04 MED ORDER — HEPARIN (PORCINE) IN NACL 2-0.9 UNIT/ML-% IJ SOLN
INTRAMUSCULAR | Status: AC
  Filled 2015-03-04: qty 1000

## 2015-03-04 MED ORDER — CEFAZOLIN SODIUM 1-5 GM-% IV SOLN
1.0000 g | Freq: Once | INTRAVENOUS | Status: AC
  Administered 2015-03-04: 1 g via INTRAVENOUS

## 2015-03-04 MED ORDER — HEPARIN SODIUM (PORCINE) 1000 UNIT/ML IJ SOLN
INTRAMUSCULAR | Status: DC | PRN
  Administered 2015-03-04: 3000 [IU] via INTRAVENOUS

## 2015-03-04 MED ORDER — FENTANYL CITRATE (PF) 100 MCG/2ML IJ SOLN
INTRAMUSCULAR | Status: AC
  Filled 2015-03-04: qty 2

## 2015-03-04 MED ORDER — IOHEXOL 300 MG/ML  SOLN
INTRAMUSCULAR | Status: DC | PRN
  Administered 2015-03-04: 60 mL via INTRA_ARTERIAL

## 2015-03-04 SURGICAL SUPPLY — 12 items
BALLN DORADO 8X40X80 (BALLOONS) ×2
BALLN LUTONIX DCB 7X40X130 (BALLOONS) ×2
BALLN LUTONIX DCB 7X60X130 (BALLOONS) ×2
BALLOON DORADO 8X40X80 (BALLOONS) ×1 IMPLANT
BALLOON LUTONIX DCB 7X40X130 (BALLOONS) ×1 IMPLANT
BALLOON LUTONIX DCB 7X60X130 (BALLOONS) ×1 IMPLANT
CANNULA 5F STIFF (CANNULA) ×2 IMPLANT
DEVICE PRESTO INFLATION (MISCELLANEOUS) ×2 IMPLANT
PACK ANGIOGRAPHY (CUSTOM PROCEDURE TRAY) ×2 IMPLANT
SHEATH BRITE TIP 6FRX5.5 (SHEATH) ×2 IMPLANT
TOWEL OR 17X26 4PK STRL BLUE (TOWEL DISPOSABLE) ×2 IMPLANT
WIRE MAGIC TORQUE 260C (WIRE) ×2 IMPLANT

## 2015-03-04 NOTE — Discharge Instructions (Signed)
The drugs you were given will stay in your system until tomorrow, so for the next 24 hours you should not.  Drive an automobile. Make any legal decisions.  Drink any alcoholic beverages.  Today you should start with liquids and gradually work up to solid foods as your are able to tolerate them  Resume your regular medications as prescribed by your doctor.  Avoid aspirin for 24 hours.    Change the Band-Aid or dressing as needed.  After one day no dressing is needed.  Avoid strenuous activity for the remainder of the day.  Please notify your primary physician immediately if you have any unusual bleeding, trouble breathing, fever >100 degrees or pain not relieved by the medication your doctor prescribed for your doctor prescribed for you physician  Return to diaslysis  Tommorow.  Restart coumadin today.

## 2015-03-04 NOTE — H&P (Signed)
  [facility] History & Physical Update  The patient was interviewed and re-examined.  The patient's previous History and Physical has been reviewed and is unchanged.  There is no change in the plan of care. We plan to proceed with the scheduled procedure.  DEW,JASON, MD  03/04/2015, 11:54 AM

## 2015-03-04 NOTE — Op Note (Signed)
[facility]    OPERATIVE NOTE   PROCEDURE: 1.   Right brachiocephalic arteriovenous fistula cannulation under ultrasound guidance 2.   Right arm fistulagram including central venogram 3.   Percutaneous transluminal angioplasty of cephalic vein subclavian vein confluence 7 mm diameter by 6 cm length Lutonix drug-coated angioplasty balloon and 8 mm diameter by 4 cm length high pressure angioplasty balloon 4.   Percutaneous transluminal angioplasty of perianastomotic stenosis with 7 mm diameter by 4 cm length Lutonix drug-coated angioplasty balloon  PRE-OPERATIVE DIAGNOSIS: 1. ESRD 2. Poorly functional right arm AVF  POST-OPERATIVE DIAGNOSIS: same as above   SURGEON: Leotis Pain, MD  ANESTHESIA: local with MCS  ESTIMATED BLOOD LOSS: 25 cc  FINDING(S): 1. 80-90% stenosis of the cephalic vein subclavian vein confluence and 60-65% perianastomotic stenosis, both improved with angioplasty  SPECIMEN(S):  None  CONTRAST: 60 cc  INDICATIONS: [name] is a 81 y.o. female who presents with malfunctioning  right brachiocephalic arteriovenous fistula.  She has had prolonged bleeding and difficulty with access. He noninvasive study has suggested significant areas of stenosis within the fistula. The patient is scheduled for  right brachiocephalic fistulagram.  The patient is aware the risks include but are not limited to: bleeding, infection, thrombosis of the cannulated access, and possible anaphylactic reaction to the contrast.  The patient is aware of the risks of the procedure and elects to proceed forward.  DESCRIPTION: After full informed written consent was obtained, the patient was brought back to the angiography suite and placed supine upon the angiography table.  The patient was connected to monitoring equipment.  The  right arm was prepped and draped in the standard fashion for a percutaneous access intervention.  Under ultrasound guidance, the  right  brachiocephalic arteriovenous fistula was cannulated with a micropuncture needle under direct ultrasound guidance and a permanent image was performed.  The microwire was advanced into the fistula and the needle was exchanged for the a microsheath.  I then upsized to a 6 Fr Sheath and imaging was performed.  Hand injections were completed to image the access including the central venous system. This demonstrated about an 80-90% stenosis of the cephalic vein subclavian vein confluence as well as about a 60-65% stenosis in the perianastomotic region that was seen with compression of the fistula and then injection through the sheath.  Based on the images, this patient will need intervention to these areas. I then gave the patient 3000 units of intravenous heparin.  I initially crossed the cephalic vein subclavian vein confluence stenosis with a Magic Tourqe wire.  Based on the imaging, a 7 mm x 6 cm  Lutonix drug-coated angioplasty balloon was selected.  The balloon was centered around the cephalic vein subclavian vein confluence stenosis and inflated to 14 ATM for 1 minute(s).  This was slightly undersized and did not resolve the stenosis or the waist with inflation to burst pressure so I upsized to an 8 mm diameter by 4 cm length high pressure angioplasty balloon. 2 inflations were performed the first for 1 minute and the second for 2 minutes with a maximal pressure of 20 atm. On completion imaging, a 10-15% residual stenosis was present.    I then was able to flip the sheath keeping the Magic torque wire into the fistula for luminal access and direct the sheath in a retrograde fashion as it had been antegrade initially. I crossed the perianastomotic stenosis without difficulty with a Magic torque wire and a Kumpe catheter.  Over the Magic torque wire, I selected a 7 mm diameter by 4 cm length Lutonix drug-coated angioplasty balloon. The distal tip of the balloon was placed just into the brachial artery. The  balloon was inflated to 10 atm for 1 minute and on completion angiogram there was significant improvement with about a 20-25% residual stenosis remaining.  Based on the completion imaging, no further intervention is necessary.  The wire and balloon were removed from the sheath.  A 4-0 Monocryl purse-string suture was sewn around the sheath.  The sheath was removed while tying down the suture.  A sterile bandage was applied to the puncture site.  COMPLICATIONS: None  CONDITION: Stable   DEW,JASON  03/04/2015 1:48 PM

## 2015-03-05 ENCOUNTER — Encounter: Payer: Self-pay | Admitting: Vascular Surgery

## 2015-05-22 ENCOUNTER — Ambulatory Visit
Admission: RE | Admit: 2015-05-22 | Discharge: 2015-05-22 | Disposition: A | Discharge status: Home / Self Care | Payer: Medicare Other | Source: Ambulatory Visit | Attending: Family Medicine | Admitting: Family Medicine

## 2015-05-22 ENCOUNTER — Other Ambulatory Visit: Payer: Self-pay | Admitting: Family Medicine

## 2015-05-22 ENCOUNTER — Encounter: Payer: Self-pay | Admitting: *Deleted

## 2015-05-22 ENCOUNTER — Emergency Department
Admission: EM | Admit: 2015-05-22 | Discharge: 2015-05-22 | Disposition: A | Discharge status: Home / Self Care | Payer: Medicare Other | Attending: Emergency Medicine | Admitting: Emergency Medicine

## 2015-05-22 DIAGNOSIS — N186 End stage renal disease: Secondary | ICD-10-CM | POA: Diagnosis not present

## 2015-05-22 DIAGNOSIS — Z792 Long term (current) use of antibiotics: Secondary | ICD-10-CM | POA: Insufficient documentation

## 2015-05-22 DIAGNOSIS — M86172 Other acute osteomyelitis, left ankle and foot: Secondary | ICD-10-CM | POA: Diagnosis not present

## 2015-05-22 DIAGNOSIS — E119 Type 2 diabetes mellitus without complications: Secondary | ICD-10-CM | POA: Insufficient documentation

## 2015-05-22 DIAGNOSIS — B999 Unspecified infectious disease: Secondary | ICD-10-CM

## 2015-05-22 DIAGNOSIS — L089 Local infection of the skin and subcutaneous tissue, unspecified: Secondary | ICD-10-CM | POA: Diagnosis present

## 2015-05-22 DIAGNOSIS — Z794 Long term (current) use of insulin: Secondary | ICD-10-CM | POA: Diagnosis not present

## 2015-05-22 DIAGNOSIS — I120 Hypertensive chronic kidney disease with stage 5 chronic kidney disease or end stage renal disease: Secondary | ICD-10-CM | POA: Diagnosis not present

## 2015-05-22 DIAGNOSIS — L97529 Non-pressure chronic ulcer of other part of left foot with unspecified severity: Secondary | ICD-10-CM

## 2015-05-22 DIAGNOSIS — L03116 Cellulitis of left lower limb: Secondary | ICD-10-CM | POA: Diagnosis not present

## 2015-05-22 DIAGNOSIS — Z992 Dependence on renal dialysis: Secondary | ICD-10-CM | POA: Insufficient documentation

## 2015-05-22 DIAGNOSIS — Z79899 Other long term (current) drug therapy: Secondary | ICD-10-CM | POA: Diagnosis not present

## 2015-05-22 LAB — COMPREHENSIVE METABOLIC PANEL
ALBUMIN: 3.1 g/dL — AB (ref 3.5–5.0)
ALK PHOS: 62 U/L (ref 38–126)
ALT: 14 U/L (ref 14–54)
AST: 21 U/L (ref 15–41)
Anion gap: 14 (ref 5–15)
BILIRUBIN TOTAL: 0.4 mg/dL (ref 0.3–1.2)
BUN: 67 mg/dL — AB (ref 6–20)
CALCIUM: 8.1 mg/dL — AB (ref 8.9–10.3)
CO2: 24 mmol/L (ref 22–32)
Chloride: 103 mmol/L (ref 101–111)
Creatinine, Ser: 5.33 mg/dL — ABNORMAL HIGH (ref 0.44–1.00)
GFR calc Af Amer: 8 mL/min — ABNORMAL LOW (ref >60)
GFR calc non Af Amer: 7 mL/min — ABNORMAL LOW (ref >60)
GLUCOSE: 211 mg/dL — AB (ref 65–99)
Potassium: 3.7 mmol/L (ref 3.5–5.1)
Sodium: 141 mmol/L (ref 135–145)
TOTAL PROTEIN: 7.5 g/dL (ref 6.5–8.1)

## 2015-05-22 LAB — CBC
HCT: 38.4 % (ref 35.0–47.0)
Hemoglobin: 12.3 g/dL (ref 12.0–16.0)
MCH: 30.5 pg (ref 26.0–34.0)
MCHC: 32.2 g/dL (ref 32.0–36.0)
MCV: 94.8 fL (ref 80.0–100.0)
Platelets: 260 10*3/uL (ref 150–440)
RBC: 4.05 MIL/uL (ref 3.80–5.20)
RDW: 16.4 % — AB (ref 11.5–14.5)
WBC: 10.4 10*3/uL (ref 3.6–11.0)

## 2015-05-22 MED ORDER — VANCOMYCIN HCL IN DEXTROSE 1-5 GM/200ML-% IV SOLN
1000.0000 mg | Freq: Once | INTRAVENOUS | Status: AC
  Administered 2015-05-22: 1000 mg via INTRAVENOUS
  Filled 2015-05-22: qty 200

## 2015-05-22 NOTE — Discharge Instructions (Signed)
You have been seen in the emergency department for a infection of your left small toe. Your nephrologist has arranged for you to receive IV antibiotics following her dialysis session. Please follow-up with your primary care physician and nephrologist regarding this infection. Please follow-up with orthopedics regarding this infection. Return to the emergency department for any fever, or worsening/spreading infection.    Bone and Joint Infections, Adult Bone infections (osteomyelitis) and joint infections (septic arthritis) occur when bacteria or other germs get inside a bone or a joint. This can happen if you have an infection in another part of your body that spreads through your blood. Germs from your skin or from outside of your body can also cause this type of infection if you have a wound or a broken bone (fracture) that breaks the skin. Anyone can get a bone infection or joint infection. You may be more likely to get this type of infection if you have a condition, such as diabetes, that lowers your ability to fight infection or increases your chances of getting an infection. Bone and joint infections can cause damage, and they can spread to other areas of your body. They need to be treated quickly. CAUSES Most bone and joint infections are caused by bacteria. They can also be caused by other germs, such as viruses and funguses. RISK FACTORS This condition is more likely to develop in:  People who recently had surgery, especially bone or joint surgery.  People who have a long-term (chronic) disease, such as:  HIV (human immunodeficiency virus).  Diabetes.  Rheumatoid arthritis.  Sickle cell anemia.  Elderly people.  People who take medicines that block or weaken the body's defense system (immune system).  People who have a condition that reduces their blood flow.  People who are on kidney dialysis.  People who have an artificial joint.  People who have had a joint or bone  repaired with plates or screws (surgical hardware).  People who use or abuse IV drugs.  People who have had trauma, such as stepping on a nail. SYMPTOMS Symptoms vary depending on the type and location of your infection. Common symptoms of bone and joint infections include:  Fever and chills.  Redness and warmth.  Swelling.  Pain and stiffness.  Drainage of fluid or pus near the infection.  Weight loss and fatigue.  Decreased ability to use a hand or foot. DIAGNOSIS This condition may be diagnosed based on symptoms, medical history, a physical exam, and diagnostic tests. Tests can help to identify the cause of the infection. You may have various tests, such as:  A sample of tissue, fluid, or blood taken to be examined under a microscope.  A procedure to remove fluid from the infected joint with a needle (joint aspiration) for testing in a lab.  Pus or discharge swabbed from a wound for testing to identify germs and to determine what type of medicine will kill them (culture and sensitivity).  Blood tests to look for evidence of infection and inflammation (biomarkers).  Imaging studies to determine how severe the bone or joint infection is. These may include:  X-rays.  CT scan.  MRI.  Bone scan. TREATMENT Treatment depends on the cause and type of infection. Antibiotic medicines are usually the first treatment for a bone or joint infection. Treatment with antibiotics may include:  Getting IV antibiotics. This may be done in a hospital at first. You may have to continue IV antibiotics at home for several weeks. You may also have  to take antibiotics by mouth for several weeks after that.  Taking more than one kind of antibiotic. Treatment may start with a type of antibiotic that works against many different bacteria (broad spectrumantibiotics). IV antibiotics may be changed if tests show that another type may work better. Other treatments may include:  Draining fluid  from the joint by placing a needle into it (aspiration).  Surgery to remove:  Dead or dying tissue from a bone or joint.  An infected artificial joint.  Infected plates or screws that were used to repair a broken bone. HOME CARE INSTRUCTIONS  Take medicines only as directed by your health care provider.  Take your antibiotic medicine as directed by your health care provider. Finish the antibiotic even if you start to feel better.  Follow instructions from your health care provider about how to take IV antibiotics at home.  Ask your health care provider if you have any restrictions on your activities.  Keep all follow-up visits as directed by your health care provider. This is important. SEEK MEDICAL CARE IF:  You have a fever or chills.  You have redness, warmth, pain, or swelling that returns after treatment. SEEK IMMEDIATE MEDICAL CARE IF:  You have rapid breathing or you have trouble breathing.  You have chest pain.  You cannot drink fluids or make urine.  The affected arm or leg swells, changes color, or turns blue.   This information is not intended to replace advice given to you by your health care provider. Make sure you discuss any questions you have with your health care provider.   Document Released: 05/18/2005 Document Revised: 10/02/2014 Document Reviewed: 05/16/2014 Elsevier Interactive Patient Education [employer]

## 2015-05-22 NOTE — ED Provider Notes (Signed)
[facility] Emergency Department Provider Note  Time seen: 4:51 PM  I have reviewed the triage vital signs and the nursing notes.   HISTORY  Chief Complaint Wound Infection    HPI [name] is a 81 y.o. female with a past medical history of end-stage renal disease on dialysis Tuesday/Thursday/Saturday, hypertension, CVA, diabetes, who presents the emergency department with a left small toe infection. According to the patient for the past 5 days she has noticed redness surrounding her left small toe. She saw [place] urgent care who obtained an x-ray showing possible osteomyelitis and she was sent to the emergency department for further evaluation.Patient states mild discomfort but states she has a history of neuropathy and cannot really feel her toes or feet well. Denies fever, nausea, vomiting.     Past Medical History  Diagnosis Date  . Chronic kidney disease   . Dysrhythmia   . Diabetes mellitus without complication (HCC)   . Hypertension   . Stroke (HCC)   . Arthritis     gout  . Dialysis patient [facility])     Patient Active Problem List   Diagnosis Date Noted  . Respiratory distress 12/11/2014    Past Surgical History  Procedure Laterality Date  . Cholecystectomy    . Peripheral vascular catheterization N/A 10/08/2014    Procedure: A/V Shuntogram/Fistulagram;  Surgeon: Annice Needy, MD;  Location: [facility] INVASIVE CV LAB;  Service: Cardiovascular;  Laterality: N/A;  . Joint replacement      bilateral hip  . Peripheral vascular catheterization N/A 03/04/2015    Procedure: A/V Shuntogram/Fistulagram;  Surgeon: Annice Needy, MD;  Location: [facility] INVASIVE CV LAB;  Service: Cardiovascular;  Laterality: N/A;  . Peripheral vascular catheterization N/A 03/04/2015    Procedure: A/V Shunt Intervention;  Surgeon: Annice Needy, MD;  Location: [facility] INVASIVE CV LAB;  Service: Cardiovascular;  Laterality: N/A;    Current Outpatient Rx  Name  Route  Sig  Dispense   Refill  . acetaminophen (TYLENOL) 500 MG tablet   Oral   Take 500 mg by mouth every 6 (six) hours as needed for moderate pain.         Marland Kitchen allopurinol (ZYLOPRIM) 100 MG tablet   Oral   Take 50 mg by mouth daily.         Marland Kitchen amoxicillin-clavulanate (AUGMENTIN) 500-125 MG tablet   Oral   Take 1 tablet by mouth 2 (two) times daily.         . Biotin 1 MG CAPS   Oral   Take by mouth every morning.         . bisacodyl (DULCOLAX) 5 MG EC tablet   Oral   Take 5 mg by mouth daily as needed for moderate constipation.         Marland Kitchen diltiazem (CARDIZEM CD) 240 MG 24 hr capsule   Oral   Take 240 mg by mouth daily.         Marland Kitchen ELIQUIS 2.5 MG TABS tablet   Oral   Take 1 tablet by mouth 2 (two) times daily.           Dispense as written.   . folic acid-vitamin b complex-vitamin c-selenium-zinc (DIALYVITE) 3 MG TABS tablet   Oral   Take 1 tablet by mouth daily.         . furosemide (LASIX) 80 MG tablet   Oral   Take 80 mg by mouth 2 (two) times daily.         Marland Kitchen  gabapentin (NEURONTIN) 300 MG capsule   Oral   Take 300 mg by mouth 3 (three) times daily. 1 FOUR HOURS AFTER DIALYSIS ONLY.         Marland Kitchen Insulin Detemir (LEVEMIR) 100 UNIT/ML Pen   Subcutaneous   Inject 10-14 Units into the skin daily at 10 pm.         . lidocaine-prilocaine (EMLA) cream   Topical   Apply 1 application topically as needed.         Marland Kitchen PARoxetine (PAXIL) 40 MG tablet   Oral   Take 40 mg by mouth every evening.         Marland Kitchen PHOSLYRA 667 MG/5ML SOLN   Oral   Take 1,334 mg by mouth 3 (three) times daily with meals.            Dispense as written.     Allergies Codeine; Oxycodone; and Quinine derivatives  No family history on file.  Social History Social History  Substance Use Topics  . Smoking status: Never Smoker   . Smokeless tobacco: None  . Alcohol Use: No    Review of Systems Constitutional: Negative for fever. Cardiovascular: Negative for chest pain. Respiratory:  Negative for shortness of breath. Gastrointestinal: Negative for abdominal pain, vomiting and diarrhea. Musculoskeletal: Positive for left small toe redness and swelling. Neurological: Negative for headache 10-point ROS otherwise negative.  ____________________________________________   PHYSICAL EXAM:  VITAL SIGNS: ED Triage Vitals  Enc Vitals Group     BP 05/22/15 1442 105/49 mmHg     Pulse Rate 05/22/15 1442 88     Resp 05/22/15 1442 18     Temp 05/22/15 1442 98.1 F (36.7 C)     Temp Source 05/22/15 1442 Oral     SpO2 05/22/15 1442 95 %     Weight 05/22/15 1442 160 lb (72.576 kg)     Height 05/22/15 1442  (1.651 m)     Head Cir --      Peak Flow --      Pain Score 05/22/15 1559 0     Pain Loc --      Pain Edu? --      Excl. in GC? --     Constitutional: Alert and oriented. Well appearing and in no distress. Eyes: Normal exam ENT   Head: Normocephalic and atraumatic.   Mouth/Throat: Mucous membranes are moist. Cardiovascular: Normal rate, regular rhythm.  Respiratory: Normal respiratory effort without tachypnea nor retractions. Breath sounds are clear  Gastrointestinal: Soft and nontender. No distention.   Musculoskeletal: Patient has mild edema and moderate erythema of the left fifth toe. She has a small ulceration approximately 0.5 cm to the medial aspect of the left small toe. Erythema does not appear to be extending to the foot, and at the base of the small toe. Neurologic:  Normal speech and language. No gross focal neurologic deficits  Skin:  Skin is warm, dry and intact.  Psychiatric: Mood and affect are normal. Speech and behavior are normal.  ____________________________________________   RADIOLOGY  X-ray consistent with possible osteomyelitis of the distal phalanx of the left small toe   INITIAL IMPRESSION / ASSESSMENT AND PLAN / ED COURSE  Pertinent labs & imaging results that were available during my care of the patient were reviewed by  me and considered in my medical decision making (see chart for details).  X-ray consistent with osteomyelitis of the left small toe. Labs are at the patient's baseline, with a white blood cell count  of 10.4. We will discuss the patient with her nephrologist Dr. Thedore MinsSingh, for appropriate antibiotic administration as the patient is on dialysis.  Discussed with Dr. Thedore MinsSingh, he will have the patient seen infectious diseases and start the patient on vancomycin after her next dialysis session tomorrow.  Will discharge patient home after her vancomycin infusion finishes.  ____________________________________________   FINAL CLINICAL IMPRESSION(S) / ED DIAGNOSES  Cellulitis Osteomyelitis   Minna AntisKevin Kriston Mckinnie, MD 05/22/15 1816

## 2015-05-22 NOTE — ED Notes (Signed)
Pt states she noticed wound to left foot pinky toe.  Currently is red and serosangiunous drainage.  XR done this morning and was told she has infection in her bone.

## 2015-05-22 NOTE — ED Notes (Addendum)
Pt sent her by [facility] primary care, left 5th pinky toe is infected, pt reports Xray was done today which showed osteomyelitis, pt denies fevers, pt is diabetic, pt receives dialysis Tues, thursday and Saturday

## 2015-05-28 ENCOUNTER — Other Ambulatory Visit
Admission: RE | Admit: 2015-05-28 | Discharge: 2015-05-28 | Disposition: A | Discharge status: Home / Self Care | Payer: Medicare Other | Source: Ambulatory Visit | Attending: Podiatry | Admitting: Podiatry

## 2015-05-28 DIAGNOSIS — L03032 Cellulitis of left toe: Secondary | ICD-10-CM | POA: Diagnosis present

## 2015-05-31 ENCOUNTER — Ambulatory Visit: Payer: Medicare Other | Admitting: General Surgery

## 2015-06-01 LAB — WOUND CULTURE

## 2015-06-01 LAB — ANAEROBIC CULTURE

## 2015-06-02 DIAGNOSIS — I639 Cerebral infarction, unspecified: Secondary | ICD-10-CM

## 2015-06-02 HISTORY — DX: Cerebral infarction, unspecified: I63.9

## 2015-06-04 ENCOUNTER — Encounter
Admission: RE | Admit: 2015-06-04 | Discharge: 2015-06-04 | Disposition: A | Discharge status: Home / Self Care | Payer: Medicare Other | Source: Ambulatory Visit | Attending: Podiatry | Admitting: Podiatry

## 2015-06-04 HISTORY — DX: Pulmonary hypertension, unspecified: I27.20

## 2015-06-04 HISTORY — DX: Pleural effusion, not elsewhere classified: J90

## 2015-06-04 NOTE — Patient Instructions (Signed)
  Your procedure is scheduled on: Friday 06/07/2015 Report to Day Surgery. 2ND FLOOR MEDICAL MALL ENTRANCE To find out your arrival time please call [phone] between 1PM - 3PM on Tuesday 06/06/2015.  Remember: Instructions that are not followed completely may result in serious medical risk, up to and including death, or upon the discretion of your surgeon and anesthesiologist your surgery may need to be rescheduled.    __X__ 1. Do not eat food or drink liquids after midnight. No gum chewing or hard candies.     __X__ 2. No Alcohol for 24 hours before or after surgery.   ____ 3. Bring all medications with you on the day of surgery if instructed.    __X__ 4. Notify your doctor if there is any change in your medical condition     (cold, fever, infections).     Do not wear jewelry, make-up, hairpins, clips or nail polish.  Do not wear lotions, powders, or perfumes  Do not shave 48 hours prior to surgery. Men may shave face and neck.  Do not bring valuables to the hospital.    [facility] is not responsible for any belongings or valuables.               Contacts, dentures or bridgework may not be worn into surgery.  Leave your suitcase in the car. After surgery it may be brought to your room.  For patients admitted to the hospital, discharge time is determined by your                treatment team.   Patients discharged the day of surgery will not be allowed to drive home.   Please read over the following fact sheets that you were given:   Surgical Site Infection Prevention   __X__ Take these medicines the morning of surgery with A SIP OF WATER:    1. DILTIAZEM  2.   3.   4.  5.  6.  ____ Fleet Enema (as directed)   __X__ Use CHG Soap as directed  ____ Use inhalers on the day of surgery  ____ Stop metformin 2 days prior to surgery    __X__ Take 1/2 of usual insulin dose the night before surgery and none on the morning of surgery.   __X__ Stop Coumadin/Plavix/aspirin on  STOP ELIQUIS AS DIRECTED BY DR Lady GaryFATH  ____ Stop Anti-inflammatories on    __X__ Stop supplements until after surgery.    ____ Bring C-Pap to the hospital.

## 2015-06-07 ENCOUNTER — Ambulatory Visit: Payer: Medicare Other | Admitting: Certified Registered Nurse Anesthetist

## 2015-06-07 ENCOUNTER — Ambulatory Visit
Admission: RE | Admit: 2015-06-07 | Discharge: 2015-06-07 | Disposition: A | Discharge status: Home / Self Care | Payer: Medicare Other | Source: Ambulatory Visit | Attending: Podiatry | Admitting: Podiatry

## 2015-06-07 ENCOUNTER — Encounter
Admission: RE | Disposition: A | Discharge status: Home / Self Care | Payer: Self-pay | Source: Ambulatory Visit | Attending: Podiatry

## 2015-06-07 DIAGNOSIS — E782 Mixed hyperlipidemia: Secondary | ICD-10-CM | POA: Insufficient documentation

## 2015-06-07 DIAGNOSIS — E1165 Type 2 diabetes mellitus with hyperglycemia: Secondary | ICD-10-CM | POA: Insufficient documentation

## 2015-06-07 DIAGNOSIS — I4510 Unspecified right bundle-branch block: Secondary | ICD-10-CM | POA: Diagnosis not present

## 2015-06-07 DIAGNOSIS — M159 Polyosteoarthritis, unspecified: Secondary | ICD-10-CM | POA: Diagnosis not present

## 2015-06-07 DIAGNOSIS — G609 Hereditary and idiopathic neuropathy, unspecified: Secondary | ICD-10-CM | POA: Insufficient documentation

## 2015-06-07 DIAGNOSIS — I499 Cardiac arrhythmia, unspecified: Secondary | ICD-10-CM | POA: Insufficient documentation

## 2015-06-07 DIAGNOSIS — Z7901 Long term (current) use of anticoagulants: Secondary | ICD-10-CM | POA: Insufficient documentation

## 2015-06-07 DIAGNOSIS — Z8673 Personal history of transient ischemic attack (TIA), and cerebral infarction without residual deficits: Secondary | ICD-10-CM | POA: Insufficient documentation

## 2015-06-07 DIAGNOSIS — Z79899 Other long term (current) drug therapy: Secondary | ICD-10-CM | POA: Insufficient documentation

## 2015-06-07 DIAGNOSIS — M89572 Osteolysis, left ankle and foot: Secondary | ICD-10-CM | POA: Insufficient documentation

## 2015-06-07 DIAGNOSIS — N186 End stage renal disease: Secondary | ICD-10-CM | POA: Insufficient documentation

## 2015-06-07 DIAGNOSIS — I509 Heart failure, unspecified: Secondary | ICD-10-CM | POA: Diagnosis not present

## 2015-06-07 DIAGNOSIS — F419 Anxiety disorder, unspecified: Secondary | ICD-10-CM | POA: Insufficient documentation

## 2015-06-07 DIAGNOSIS — F329 Major depressive disorder, single episode, unspecified: Secondary | ICD-10-CM | POA: Diagnosis not present

## 2015-06-07 DIAGNOSIS — Z888 Allergy status to other drugs, medicaments and biological substances status: Secondary | ICD-10-CM | POA: Diagnosis not present

## 2015-06-07 DIAGNOSIS — Z885 Allergy status to narcotic agent status: Secondary | ICD-10-CM | POA: Insufficient documentation

## 2015-06-07 DIAGNOSIS — Z794 Long term (current) use of insulin: Secondary | ICD-10-CM | POA: Diagnosis not present

## 2015-06-07 DIAGNOSIS — S98132D Complete traumatic amputation of one left lesser toe, subsequent encounter: Secondary | ICD-10-CM

## 2015-06-07 DIAGNOSIS — M1990 Unspecified osteoarthritis, unspecified site: Secondary | ICD-10-CM | POA: Insufficient documentation

## 2015-06-07 DIAGNOSIS — I120 Hypertensive chronic kidney disease with stage 5 chronic kidney disease or end stage renal disease: Secondary | ICD-10-CM | POA: Insufficient documentation

## 2015-06-07 DIAGNOSIS — K219 Gastro-esophageal reflux disease without esophagitis: Secondary | ICD-10-CM | POA: Diagnosis not present

## 2015-06-07 DIAGNOSIS — Z9049 Acquired absence of other specified parts of digestive tract: Secondary | ICD-10-CM | POA: Insufficient documentation

## 2015-06-07 DIAGNOSIS — E213 Hyperparathyroidism, unspecified: Secondary | ICD-10-CM | POA: Diagnosis not present

## 2015-06-07 DIAGNOSIS — I4891 Unspecified atrial fibrillation: Secondary | ICD-10-CM | POA: Insufficient documentation

## 2015-06-07 HISTORY — PX: AMPUTATION TOE: SHX6595

## 2015-06-07 LAB — GLUCOSE, CAPILLARY
Glucose-Capillary: 116 mg/dL — ABNORMAL HIGH (ref 65–99)
Glucose-Capillary: 114 mg/dL — ABNORMAL HIGH (ref 65–99)

## 2015-06-07 LAB — POCT I-STAT 4, (NA,K, GLUC, HGB,HCT)
Glucose, Bld: 123 mg/dL — ABNORMAL HIGH (ref 65–99)
HCT: 34 % — ABNORMAL LOW (ref 36.0–46.0)
Hemoglobin: 11.6 g/dL — ABNORMAL LOW (ref 12.0–15.0)
Potassium: 3.7 mmol/L (ref 3.5–5.1)
Sodium: 140 mmol/L (ref 135–145)

## 2015-06-07 SURGERY — AMPUTATION, TOE
Anesthesia: Monitor Anesthesia Care | Laterality: Left

## 2015-06-07 MED ORDER — LIDOCAINE HCL (PF) 1 % IJ SOLN
INTRAMUSCULAR | Status: AC
  Filled 2015-06-07: qty 30

## 2015-06-07 MED ORDER — FAMOTIDINE 20 MG PO TABS
20.0000 mg | ORAL_TABLET | Freq: Once | ORAL | Status: AC
  Administered 2015-06-07: 20 mg via ORAL

## 2015-06-07 MED ORDER — CEFAZOLIN SODIUM-DEXTROSE 2-3 GM-% IV SOLR
INTRAVENOUS | Status: AC
  Administered 2015-06-07: 2 g via INTRAVENOUS
  Filled 2015-06-07: qty 50

## 2015-06-07 MED ORDER — CEFAZOLIN SODIUM-DEXTROSE 2-3 GM-% IV SOLR
2.0000 g | Freq: Once | INTRAVENOUS | Status: AC
  Administered 2015-06-07: 2 g via INTRAVENOUS

## 2015-06-07 MED ORDER — FAMOTIDINE 20 MG PO TABS
ORAL_TABLET | ORAL | Status: AC
  Filled 2015-06-07: qty 1

## 2015-06-07 MED ORDER — SODIUM CHLORIDE 0.9 % IV SOLN
INTRAVENOUS | Status: DC
  Administered 2015-06-07 (×2): via INTRAVENOUS

## 2015-06-07 MED ORDER — LIDOCAINE HCL (PF) 1 % IJ SOLN
INTRAMUSCULAR | Status: DC | PRN
  Administered 2015-06-07: 4.5 mL

## 2015-06-07 MED ORDER — MIDAZOLAM HCL 2 MG/2ML IJ SOLN
INTRAMUSCULAR | Status: DC | PRN
  Administered 2015-06-07: 2 mg via INTRAVENOUS

## 2015-06-07 MED ORDER — FENTANYL CITRATE (PF) 100 MCG/2ML IJ SOLN
INTRAMUSCULAR | Status: DC | PRN
  Administered 2015-06-07: 100 ug via INTRAVENOUS

## 2015-06-07 MED ORDER — ONDANSETRON HCL 4 MG/2ML IJ SOLN: 4.0000 mg | Freq: Once | INTRAMUSCULAR | Status: DC | PRN

## 2015-06-07 MED ORDER — PROPOFOL 10 MG/ML IV BOLUS
INTRAVENOUS | Status: DC | PRN
  Administered 2015-06-07: 20 mg via INTRAVENOUS

## 2015-06-07 MED ORDER — BUPIVACAINE HCL (PF) 0.5 % IJ SOLN
INTRAMUSCULAR | Status: AC
  Filled 2015-06-07: qty 30

## 2015-06-07 MED ORDER — PROPOFOL 500 MG/50ML IV EMUL
INTRAVENOUS | Status: DC | PRN
  Administered 2015-06-07: 50 ug/kg/min via INTRAVENOUS

## 2015-06-07 MED ORDER — FENTANYL CITRATE (PF) 100 MCG/2ML IJ SOLN: 25.0000 ug | INTRAMUSCULAR | Status: DC | PRN

## 2015-06-07 MED ORDER — ONDANSETRON HCL 4 MG PO TABS: 4.0000 mg | ORAL_TABLET | Freq: Four times a day (QID) | ORAL | Status: DC | PRN

## 2015-06-07 MED ORDER — ONDANSETRON HCL 4 MG/2ML IJ SOLN: 4.0000 mg | Freq: Four times a day (QID) | INTRAMUSCULAR | Status: DC | PRN

## 2015-06-07 MED ORDER — BUPIVACAINE HCL 0.5 % IJ SOLN
INTRAMUSCULAR | Status: DC | PRN
  Administered 2015-06-07: 4.5 mL

## 2015-06-07 SURGICAL SUPPLY — 40 items
BANDAGE ELASTIC 4 LF NS (GAUZE/BANDAGES/DRESSINGS) ×2 IMPLANT
BANDAGE STRETCH 3X4.1 STRL (GAUZE/BANDAGES/DRESSINGS) ×4 IMPLANT
BLADE OSC/SAGITTAL MD 5.5X18 (BLADE) IMPLANT
BLADE SURG MINI STRL (BLADE) ×2 IMPLANT
BNDG ESMARK 4X12 TAN STRL LF (GAUZE/BANDAGES/DRESSINGS) ×2 IMPLANT
BNDG GAUZE 4.5X4.1 6PLY STRL (MISCELLANEOUS) ×2 IMPLANT
CANISTER SUCT 1200ML W/VALVE (MISCELLANEOUS) ×2 IMPLANT
CUFF TOURN DUAL PL 12 NO SLV (MISCELLANEOUS) ×2 IMPLANT
DRAPE FLUOR MINI C-ARM 54X84 (DRAPES) ×2 IMPLANT
DRAPE XRAY CASSETTE 23X24 (DRAPES) ×2 IMPLANT
DRESSING ALLEVYN 4X4 (MISCELLANEOUS) ×2 IMPLANT
DURAPREP 26ML APPLICATOR (WOUND CARE) ×2 IMPLANT
GAUZE IODOFORM PACK 1/2 7832 (GAUZE/BANDAGES/DRESSINGS) ×2 IMPLANT
GAUZE PETRO XEROFOAM 1X8 (MISCELLANEOUS) ×2 IMPLANT
GAUZE SPONGE 4X4 12PLY STRL (GAUZE/BANDAGES/DRESSINGS) ×2 IMPLANT
GAUZE STRETCH 2X75IN STRL (MISCELLANEOUS) IMPLANT
GLOVE BIO SURGEON STRL SZ7.5 (GLOVE) ×6 IMPLANT
GLOVE INDICATOR 8.0 STRL GRN (GLOVE) ×6 IMPLANT
GOWN STRL REUS W/ TWL LRG LVL3 (GOWN DISPOSABLE) ×3 IMPLANT
GOWN STRL REUS W/TWL LRG LVL3 (GOWN DISPOSABLE) ×3
HANDPIECE SUCTION TUBG SURGILV (MISCELLANEOUS) IMPLANT
KIT RM TURNOVER STRD PROC AR (KITS) ×2 IMPLANT
LABEL OR SOLS (LABEL) IMPLANT
NEEDLE FILTER BLUNT 18X 1/2SAF (NEEDLE) ×1
NEEDLE FILTER BLUNT 18X1 1/2 (NEEDLE) ×1 IMPLANT
NEEDLE HYPO 25X1 1.5 SAFETY (NEEDLE) ×2 IMPLANT
NS IRRIG 500ML POUR BTL (IV SOLUTION) ×2 IMPLANT
PACK EXTREMITY ARMC (MISCELLANEOUS) ×2 IMPLANT
PAD ABD DERMACEA PRESS 5X9 (GAUZE/BANDAGES/DRESSINGS) IMPLANT
PAD GROUND ADULT SPLIT (MISCELLANEOUS) ×2 IMPLANT
SOL .9 NS 3000ML IRR  AL (IV SOLUTION)
SOL .9 NS 3000ML IRR UROMATIC (IV SOLUTION) IMPLANT
STOCKINETTE M/LG 89821 (MISCELLANEOUS) ×2 IMPLANT
STRAP SAFETY BODY (MISCELLANEOUS) ×2 IMPLANT
SUT ETHILON 3-0 FS-10 30 BLK (SUTURE) ×2
SUT ETHILON 5-0 FS-2 18 BLK (SUTURE) ×2 IMPLANT
SUT VIC AB 4-0 FS2 27 (SUTURE) ×2 IMPLANT
SUTURE EHLN 3-0 FS-10 30 BLK (SUTURE) ×1 IMPLANT
SWAB DUAL CULTURE TRANS RED ST (MISCELLANEOUS) ×2 IMPLANT
SYRINGE 10CC LL (SYRINGE) ×2 IMPLANT

## 2015-06-07 NOTE — Anesthesia Preprocedure Evaluation (Signed)
Anesthesia Evaluation  Patient identified by MRN, date of birth, ID band Patient awake    Reviewed: Allergy & Precautions, NPO status , Patient's Chart, lab work & pertinent test results  Airway Mallampati: II       Dental no notable dental hx.    Pulmonary neg pulmonary ROS,    Pulmonary exam normal        Cardiovascular Pt. on medications + dysrhythmias  Rhythm:Irregular     Neuro/Psych CVA    GI/Hepatic negative GI ROS, Neg liver ROS,   Endo/Other  diabetes, Type 1, Insulin Dependent  Renal/GU DialysisRenal disease     Musculoskeletal   Abdominal Normal abdominal exam  (+)   Peds  Hematology   Anesthesia Other Findings   Reproductive/Obstetrics                             Anesthesia Physical Anesthesia Plan  ASA: III  Anesthesia Plan: MAC   Post-op Pain Management:    Induction: Intravenous  Airway Management Planned: Nasal Cannula  Additional Equipment:   Intra-op Plan:   Post-operative Plan:   Informed Consent: I have reviewed the patients History and Physical, chart, labs and discussed the procedure including the risks, benefits and alternatives for the proposed anesthesia with the patient or authorized representative who has indicated his/her understanding and acceptance.     Plan Discussed with: CRNA  Anesthesia Plan Comments:         Anesthesia Quick Evaluation

## 2015-06-07 NOTE — Op Note (Signed)
Operative note   Surgeon:Donley Harland    Assistant:Mr Bufford ButtnerJarroed Fisher    Preop diagnosis: Osteolysis left fifth toe    Postop diagnosis: Same    Procedure: Amputation left fifth MTPJ    EBL: Minimal    Anesthesia:local and IV sedation    Hemostasis: None    Specimen: Fifth toe and joint for pathology and wound culture    Complications: None    Operative indications: 82 year old female with an open ulceration to bone on her fifth toe PIPJ. She had noted erosive changes and loose bone within the ulcerative site  Discussed surgical excision of this area. The risks benefits alternatives and competitions have been discussed and consent has been given   Procedure:  Patient was brought into the OR and placed on the operating table in thesupine position. After anesthesia was obtained theleft lower extremity was prepped and draped in usual sterile fashion.  Attention was directed to the left fifth toe where full-thickness incision was made circumferential around the toe at the level of the left of the proximal phalanx. Proximal dissection was taken to the MTPJ. The toe was then disarticulated at this time. The wound was flushed with copious amounts or irrigation. Layered closure was performed with a 4-0 Vicryl and a 5-0 nylon. A large bulky sterile dressing was applied to the left foot.    Patient tolerated the procedure and anesthesia well.  Was transported from the OR to the PACU with all vital signs stable and vascular status intact. To be discharged per routine protocol.  Will follow up in approximately 1 week in the outpatient clinic.

## 2015-06-07 NOTE — Transfer of Care (Signed)
Immediate Anesthesia Transfer of Care Note  Patient: [name]  Procedure(s) Performed: Procedure(s): AMPUTATION TOE (Left)  Patient Location: PACU  Anesthesia Type:MAC  Level of Consciousness: awake  Airway & Oxygen Therapy: Patient Spontanous Breathing and Patient connected to face mask oxygen  Post-op Assessment: Report given to RN and Post -op Vital signs reviewed and stable  Post vital signs: Reviewed  Last Vitals:  Filed Vitals:   06/07/15 1110 06/07/15 1411  BP: 126/57 138/66  Pulse: 92 92  Temp: 36.1 C 36.2 C  Resp: 18 16    Complications: No apparent anesthesia complications

## 2015-06-07 NOTE — Discharge Instructions (Signed)
[facility] °[facility] ° °POST OPERATIVE INSTRUCTIONS FOR DR. TROXLER AND DR. FOWLER °[facility] ° ° °1. Take your medication as prescribed.  Pain medication should be taken only as needed. ° °2. Keep the dressing clean, dry and intact. ° °3. Keep your foot elevated above the heart level for the first 48 hours. ° °4. Walking to the bathroom and brief periods of walking are acceptable, unless we have instructed you to be non-weight bearing. ° °5. Always wear your post-op shoe when walking.  Always use your crutches if you are to be non-weight bearing. ° °6. Do not take a shower. Baths are permissible as long as the foot is kept out of the water.  ° °7. Every hour you are awake:  °- Bend your knee 15 times. °- Flex foot 15 times °- Massage calf 15 times ° °8. Call [facility] ([phone]) if any of the following problems occur: °- You develop a temperature or fever. °- The bandage becomes saturated with blood. °- Medication does not stop your pain. °- Injury of the foot occurs. °- Any symptoms of infection including redness, odor, or red streaks running from wound. ° °General Anesthesia, Adult, Care After °Refer to this sheet in the next few weeks. These instructions provide you with information on caring for yourself after your procedure. Your health care provider may also give you more specific instructions. Your treatment has been planned according to current medical practices, but problems sometimes occur. Call your health care provider if you have any problems or questions after your procedure. °WHAT TO EXPECT AFTER THE PROCEDURE °After the procedure, it is typical to experience: °· Sleepiness. °· Nausea and vomiting. °HOME CARE INSTRUCTIONS °· For the first 24 hours after general anesthesia: °¨ Have a responsible person with you. °¨ Do not drive a car. If you are alone, do not take public transportation. °¨ Do not drink alcohol. °¨ Do not take  medicine that has not been prescribed by your health care provider. °¨ Do not sign important papers or make important decisions. °¨ You may resume a normal diet and activities as directed by your health care provider. °· Change bandages (dressings) as directed. °· If you have questions or problems that seem related to general anesthesia, call the hospital and ask for the anesthetist or anesthesiologist on call. °SEEK MEDICAL CARE IF: °· You have nausea and vomiting that continue the day after anesthesia. °· You develop a rash. °SEEK IMMEDIATE MEDICAL CARE IF:  °· You have difficulty breathing. °· You have chest pain. °· You have any allergic problems. °  °This information is not intended to replace advice given to you by your health care provider. Make sure you discuss any questions you have with your health care provider. °  °Document Released: 08/24/2000 Document Revised: 06/08/2014 Document Reviewed: 09/16/2011 °Elsevier Interactive Patient Education ©2016 [employer] ° °

## 2015-06-07 NOTE — H&P (Signed)
  HISTORY AND PHYSICAL INTERVAL NOTE:  06/07/2015  11:52 AM  [name]  has presented today for surgery, with the diagnosis of OSTEOMYELITIS.  The various methods of treatment have been discussed with the patient.  No guarantees were given.  After consideration of risks, benefits and other options for treatment, the patient has consented to surgery.  I have reviewed the patients' chart and labs.    Patient Vitals for the past 24 hrs:  BP Temp Temp src Pulse Resp SpO2 Height Weight  06/07/15 1110 (!) 126/57 mmHg (!) 96.9 F (36.1 C) Tympanic 92 18 94 % 5\' 5"  (1.651 m) 72.576 kg (160 lb)    A history and physical examination was performed in my office.  The patient was reexamined.  There have been no changes to this history and physical examination.  Gwyneth RevelsFowler, Adit Riddles A

## 2015-06-10 NOTE — Anesthesia Postprocedure Evaluation (Signed)
Anesthesia Post Note  Patient: [name]  Procedure(s) Performed: Procedure(s) (LRB): AMPUTATION TOE (Left)  Patient location during evaluation: PACU Anesthesia Type: General Level of consciousness: awake Pain management: pain level controlled Vital Signs Assessment: post-procedure vital signs reviewed and stable Respiratory status: spontaneous breathing Cardiovascular status: stable Anesthetic complications: no    Last Vitals:  Filed Vitals:   06/07/15 1515 06/07/15 1539  BP: 127/55 128/61  Pulse: 95 94  Temp: 36.1 C   Resp: 16 16    Last Pain:  Filed Vitals:   06/10/15 0820  PainSc: 0-No pain                 VAN STAVEREN,Greyson Peavy

## 2015-06-11 LAB — SURGICAL PATHOLOGY

## 2015-06-14 LAB — WOUND CULTURE

## 2015-06-18 ENCOUNTER — Emergency Department: Payer: Medicare Other

## 2015-06-18 ENCOUNTER — Inpatient Hospital Stay
Admission: EM | Admit: 2015-06-18 | Discharge: 2015-06-24 | DRG: 064 | Disposition: A | Discharge status: Skilled Nursing Facility | Payer: Medicare Other | Attending: Internal Medicine | Admitting: Internal Medicine

## 2015-06-18 ENCOUNTER — Encounter: Payer: Self-pay | Admitting: Emergency Medicine

## 2015-06-18 DIAGNOSIS — I6340 Cerebral infarction due to embolism of unspecified cerebral artery: Principal | ICD-10-CM | POA: Diagnosis present

## 2015-06-18 DIAGNOSIS — I132 Hypertensive heart and chronic kidney disease with heart failure and with stage 5 chronic kidney disease, or end stage renal disease: Secondary | ICD-10-CM | POA: Diagnosis present

## 2015-06-18 DIAGNOSIS — Z79899 Other long term (current) drug therapy: Secondary | ICD-10-CM

## 2015-06-18 DIAGNOSIS — I6522 Occlusion and stenosis of left carotid artery: Secondary | ICD-10-CM

## 2015-06-18 DIAGNOSIS — Z96643 Presence of artificial hip joint, bilateral: Secondary | ICD-10-CM | POA: Diagnosis present

## 2015-06-18 DIAGNOSIS — E1140 Type 2 diabetes mellitus with diabetic neuropathy, unspecified: Secondary | ICD-10-CM | POA: Diagnosis present

## 2015-06-18 DIAGNOSIS — N2581 Secondary hyperparathyroidism of renal origin: Secondary | ICD-10-CM | POA: Diagnosis present

## 2015-06-18 DIAGNOSIS — K219 Gastro-esophageal reflux disease without esophagitis: Secondary | ICD-10-CM | POA: Diagnosis present

## 2015-06-18 DIAGNOSIS — I482 Chronic atrial fibrillation, unspecified: Secondary | ICD-10-CM

## 2015-06-18 DIAGNOSIS — R5383 Other fatigue: Secondary | ICD-10-CM

## 2015-06-18 DIAGNOSIS — F411 Generalized anxiety disorder: Secondary | ICD-10-CM | POA: Diagnosis present

## 2015-06-18 DIAGNOSIS — S98132A Complete traumatic amputation of one left lesser toe, initial encounter: Secondary | ICD-10-CM

## 2015-06-18 DIAGNOSIS — N3281 Overactive bladder: Secondary | ICD-10-CM | POA: Diagnosis present

## 2015-06-18 DIAGNOSIS — R29898 Other symptoms and signs involving the musculoskeletal system: Secondary | ICD-10-CM

## 2015-06-18 DIAGNOSIS — R531 Weakness: Secondary | ICD-10-CM

## 2015-06-18 DIAGNOSIS — I272 Other secondary pulmonary hypertension: Secondary | ICD-10-CM | POA: Diagnosis present

## 2015-06-18 DIAGNOSIS — Z7982 Long term (current) use of aspirin: Secondary | ICD-10-CM

## 2015-06-18 DIAGNOSIS — M109 Gout, unspecified: Secondary | ICD-10-CM | POA: Diagnosis present

## 2015-06-18 DIAGNOSIS — E785 Hyperlipidemia, unspecified: Secondary | ICD-10-CM | POA: Diagnosis present

## 2015-06-18 DIAGNOSIS — Z89422 Acquired absence of other left toe(s): Secondary | ICD-10-CM

## 2015-06-18 DIAGNOSIS — G8191 Hemiplegia, unspecified affecting right dominant side: Secondary | ICD-10-CM | POA: Diagnosis present

## 2015-06-18 DIAGNOSIS — H5441 Blindness, right eye, normal vision left eye: Secondary | ICD-10-CM | POA: Diagnosis present

## 2015-06-18 DIAGNOSIS — Z885 Allergy status to narcotic agent status: Secondary | ICD-10-CM

## 2015-06-18 DIAGNOSIS — R200 Anesthesia of skin: Secondary | ICD-10-CM

## 2015-06-18 DIAGNOSIS — I5032 Chronic diastolic (congestive) heart failure: Secondary | ICD-10-CM | POA: Diagnosis present

## 2015-06-18 DIAGNOSIS — E876 Hypokalemia: Secondary | ICD-10-CM | POA: Diagnosis present

## 2015-06-18 DIAGNOSIS — Z66 Do not resuscitate: Secondary | ICD-10-CM | POA: Diagnosis present

## 2015-06-18 DIAGNOSIS — F329 Major depressive disorder, single episode, unspecified: Secondary | ICD-10-CM | POA: Diagnosis present

## 2015-06-18 DIAGNOSIS — E119 Type 2 diabetes mellitus without complications: Secondary | ICD-10-CM

## 2015-06-18 DIAGNOSIS — N186 End stage renal disease: Secondary | ICD-10-CM | POA: Diagnosis present

## 2015-06-18 DIAGNOSIS — Z794 Long term (current) use of insulin: Secondary | ICD-10-CM

## 2015-06-18 DIAGNOSIS — I639 Cerebral infarction, unspecified: Secondary | ICD-10-CM | POA: Diagnosis present

## 2015-06-18 DIAGNOSIS — E1122 Type 2 diabetes mellitus with diabetic chronic kidney disease: Secondary | ICD-10-CM | POA: Diagnosis present

## 2015-06-18 DIAGNOSIS — M1990 Unspecified osteoarthritis, unspecified site: Secondary | ICD-10-CM | POA: Diagnosis present

## 2015-06-18 DIAGNOSIS — E1142 Type 2 diabetes mellitus with diabetic polyneuropathy: Secondary | ICD-10-CM

## 2015-06-18 DIAGNOSIS — L8990 Pressure ulcer of unspecified site, unspecified stage: Secondary | ICD-10-CM

## 2015-06-18 DIAGNOSIS — Z7901 Long term (current) use of anticoagulants: Secondary | ICD-10-CM

## 2015-06-18 DIAGNOSIS — D649 Anemia, unspecified: Secondary | ICD-10-CM | POA: Diagnosis present

## 2015-06-18 DIAGNOSIS — Z992 Dependence on renal dialysis: Secondary | ICD-10-CM

## 2015-06-18 HISTORY — DX: Disorder of kidney and ureter, unspecified: N28.9

## 2015-06-18 LAB — CBC
HEMATOCRIT: 34.7 % — AB (ref 35.0–47.0)
HEMOGLOBIN: 11.6 g/dL — AB (ref 12.0–16.0)
MCH: 32.2 pg (ref 26.0–34.0)
MCHC: 33.5 g/dL (ref 32.0–36.0)
MCV: 96.1 fL (ref 80.0–100.0)
Platelets: 269 10*3/uL (ref 150–440)
RBC: 3.61 MIL/uL — AB (ref 3.80–5.20)
RDW: 15.8 % — ABNORMAL HIGH (ref 11.5–14.5)
WBC: 8.1 10*3/uL (ref 3.6–11.0)

## 2015-06-18 LAB — COMPREHENSIVE METABOLIC PANEL
ALBUMIN: 3.3 g/dL — AB (ref 3.5–5.0)
ALT: 8 U/L — AB (ref 14–54)
AST: 20 U/L (ref 15–41)
Alkaline Phosphatase: 70 U/L (ref 38–126)
Anion gap: 13 (ref 5–15)
BILIRUBIN TOTAL: 0.6 mg/dL (ref 0.3–1.2)
BUN: 26 mg/dL — AB (ref 6–20)
CO2: 25 mmol/L (ref 22–32)
CREATININE: 3.15 mg/dL — AB (ref 0.44–1.00)
Calcium: 8.2 mg/dL — ABNORMAL LOW (ref 8.9–10.3)
Chloride: 97 mmol/L — ABNORMAL LOW (ref 101–111)
GFR calc Af Amer: 15 mL/min — ABNORMAL LOW (ref >60)
GFR, EST NON AFRICAN AMERICAN: 13 mL/min — AB (ref >60)
GLUCOSE: 197 mg/dL — AB (ref 65–99)
POTASSIUM: 3.2 mmol/L — AB (ref 3.5–5.1)
Sodium: 135 mmol/L (ref 135–145)
TOTAL PROTEIN: 8.1 g/dL (ref 6.5–8.1)

## 2015-06-18 LAB — DIFFERENTIAL
BASOS ABS: 0.1 10*3/uL (ref 0–0.1)
Basophils Relative: 1 %
EOS ABS: 0.1 10*3/uL (ref 0–0.7)
Eosinophils Relative: 2 %
LYMPHS ABS: 0.8 10*3/uL — AB (ref 1.0–3.6)
LYMPHS PCT: 10 %
MONOS PCT: 9 %
Monocytes Absolute: 0.7 10*3/uL (ref 0.2–0.9)
NEUTROS ABS: 6.4 10*3/uL (ref 1.4–6.5)
NEUTROS PCT: 78 %

## 2015-06-18 LAB — GLUCOSE, CAPILLARY: Glucose-Capillary: 189 mg/dL — ABNORMAL HIGH (ref 65–99)

## 2015-06-18 LAB — APTT: APTT: 39 s — AB (ref 24–36)

## 2015-06-18 LAB — PROTIME-INR
INR: 1.2
Prothrombin Time: 15.4 seconds — ABNORMAL HIGH (ref 11.4–15.0)

## 2015-06-18 MED ORDER — POTASSIUM CHLORIDE 10 MEQ/100ML IV SOLN
10.0000 meq | INTRAVENOUS | Status: AC
  Administered 2015-06-18 – 2015-06-19 (×4): 10 meq via INTRAVENOUS
  Filled 2015-06-18 (×4): qty 100

## 2015-06-18 MED ORDER — STROKE: EARLY STAGES OF RECOVERY BOOK
Freq: Once | Status: AC
Start: 2015-06-18 — End: 2015-06-19
  Administered 2015-06-19: 01:00:00

## 2015-06-18 MED ORDER — IOHEXOL 350 MG/ML SOLN
100.0000 mL | Freq: Once | INTRAVENOUS | Status: AC | PRN
  Administered 2015-06-18: 100 mL via INTRAVENOUS

## 2015-06-18 MED ORDER — ASPIRIN 81 MG PO CHEW
81.0000 mg | CHEWABLE_TABLET | Freq: Every day | ORAL | Status: DC
Start: 2015-06-19 — End: 2015-06-25
  Administered 2015-06-19 – 2015-06-24 (×5): 81 mg via ORAL
  Filled 2015-06-18 (×7): qty 1

## 2015-06-18 MED ORDER — TRAMADOL HCL 50 MG PO TABS
50.0000 mg | ORAL_TABLET | Freq: Four times a day (QID) | ORAL | Status: DC | PRN
  Administered 2015-06-18: 50 mg via ORAL
  Filled 2015-06-18: qty 1

## 2015-06-18 MED ORDER — SODIUM CHLORIDE 0.9 % IV SOLN
Freq: Once | INTRAVENOUS | Status: AC
  Administered 2015-06-18: 18:00:00 via INTRAVENOUS

## 2015-06-18 MED ORDER — ENOXAPARIN SODIUM 30 MG/0.3ML ~~LOC~~ SOLN
30.0000 mg | SUBCUTANEOUS | Status: DC
  Administered 2015-06-19: 01:00:00 30 mg via SUBCUTANEOUS
  Filled 2015-06-18: qty 0.3

## 2015-06-18 NOTE — ED Provider Notes (Signed)
[facility] Emergency Department Provider Note   ____________________________________________  Time seen: 1750  I have reviewed the triage vital signs and the nursing notes.   HISTORY  Chief Complaint arm numbness    History limited by: Not Limited   HPI [name] is a 82 y.o. female with history of CVA, ESRD on dialysis who presents to the emergency department today because of concern for right arm numbness and weakness. The patient states that the symptoms started suddenly. They started roughly 3 hours ago. She was watching tv at that time. She does not feel like the symptoms have gotten any better or worse. She denies any headache, or recent head trauma. Denies any intracranial bleeds in the past. No recent fevers.   Past Medical History  Diagnosis Date  . Chronic kidney disease   . Dysrhythmia   . Diabetes mellitus without complication (HCC)   . Hypertension   . Stroke (HCC)   . Arthritis     gout  . Dialysis patient (HCC)   . Pleural effusion   . Pulmonary hypertension [facility])     Patient Active Problem List   Diagnosis Date Noted  . Respiratory distress 12/11/2014    Past Surgical History  Procedure Laterality Date  . Cholecystectomy    . Peripheral vascular catheterization N/A 10/08/2014    Procedure: A/V Shuntogram/Fistulagram;  Surgeon: Annice Needy, MD;  Location: [facility] INVASIVE CV LAB;  Service: Cardiovascular;  Laterality: N/A;  . Joint replacement      bilateral hip  . Peripheral vascular catheterization N/A 03/04/2015    Procedure: A/V Shuntogram/Fistulagram;  Surgeon: Annice Needy, MD;  Location: [facility] INVASIVE CV LAB;  Service: Cardiovascular;  Laterality: N/A;  . Peripheral vascular catheterization N/A 03/04/2015    Procedure: A/V Shunt Intervention;  Surgeon: Annice Needy, MD;  Location: [facility] INVASIVE CV LAB;  Service: Cardiovascular;  Laterality: N/A;  . Fistulagram ([facility] hx)    . Parathyroidectomy    . Tonsillectomy    .  Cardiac catheterization    . Eye surgery    . Cataract extraction, bilateral    . Amputation toe Left 06/07/2015    Procedure: AMPUTATION TOE;  Surgeon: Gwyneth Revels, DPM;  Location: [facility] ORS;  Service: Podiatry;  Laterality: Left;    Current Outpatient Rx  Name  Route  Sig  Dispense  Refill  . acetaminophen (TYLENOL) 500 MG tablet   Oral   Take 500 mg by mouth every 6 (six) hours as needed for moderate pain.         . Biotin 1 MG CAPS   Oral   Take by mouth every morning.         . bisacodyl (DULCOLAX) 5 MG EC tablet   Oral   Take 5 mg by mouth daily as needed for moderate constipation.         Marland Kitchen diltiazem (TIAZAC) 120 MG 24 hr capsule   Oral   Take 120 mg by mouth daily.         Marland Kitchen ELIQUIS 2.5 MG TABS tablet   Oral   Take 1 tablet by mouth 2 (two) times daily.           Dispense as written.   . folic acid-vitamin b complex-vitamin c-selenium-zinc (DIALYVITE) 3 MG TABS tablet   Oral   Take 1 tablet by mouth daily.         . furosemide (LASIX) 80 MG tablet   Oral   Take 80 mg  by mouth 2 (two) times daily.         Marland Kitchen gabapentin (NEURONTIN) 100 MG capsule   Oral   Take 100 mg by mouth at bedtime. Sun, Mon Wed, Fri (non dialysis days)         . gabapentin (NEURONTIN) 300 MG capsule   Oral   Take 300 mg by mouth at bedtime. 1 FOUR HOURS AFTER DIALYSIS ONLY.         Marland Kitchen Insulin Detemir (LEVEMIR) 100 UNIT/ML Pen   Subcutaneous   Inject 10-14 Units into the skin 2 (two) times daily.          Marland Kitchen lidocaine-prilocaine (EMLA) cream   Topical   Apply 1 application topically as needed.         . Omega-3 Fatty Acids (FISH OIL) 1200 MG CAPS   Oral   Take 1 capsule by mouth daily.         Marland Kitchen PARoxetine (PAXIL) 40 MG tablet   Oral   Take 40 mg by mouth every evening.         Marland Kitchen PHOSLYRA 667 MG/5ML SOLN   Oral   Take 1,334 mg by mouth 3 (three) times daily with meals.            Dispense as written.     Allergies Codeine; Oxycodone; and  Quinine derivatives  No family history on file.  Social History Social History  Substance Use Topics  . Smoking status: Never Smoker   . Smokeless tobacco: Never Used  . Alcohol Use: No    Review of Systems  Constitutional: Negative for fever. Cardiovascular: Negative for chest pain. Respiratory: Negative for shortness of breath. Gastrointestinal: Negative for abdominal pain, vomiting and diarrhea. Neurological: Negative for headaches. Positive for right arm weakness and numbness.  10-point ROS otherwise negative.  ____________________________________________   PHYSICAL EXAM:  VITAL SIGNS: ED Triage Vitals  Enc Vitals Group     BP 06/18/15 1731 123/58 mmHg     Pulse Rate 06/18/15 1731 94     Resp 06/18/15 1731 14     Temp 06/18/15 1731 98.7 F (37.1 C)     Temp Source 06/18/15 1731 Oral     SpO2 06/18/15 1731 98 %     Weight 06/18/15 1731 160 lb (72.576 kg)     Height 06/18/15 1731  (1.651 m)   Constitutional: Alert and oriented. Well appearing and in no distress. Eyes: Conjunctivae are normal. PERRL. Normal extraocular movements. ENT   Head: Normocephalic and atraumatic.   Nose: No congestion/rhinnorhea.   Mouth/Throat: Mucous membranes are moist.   Neck: No stridor. Hematological/Lymphatic/Immunilogical: No cervical lymphadenopathy. Cardiovascular: Normal rate, regular rhythm.  No murmurs, rubs, or gallops. Respiratory: Normal respiratory effort without tachypnea nor retractions. Breath sounds are clear and equal bilaterally. No wheezes/rales/rhonchi. Gastrointestinal: Soft and nontender. No distention. Genitourinary: Deferred Musculoskeletal: Normal range of motion in all extremities. No joint effusions.  No lower extremity tenderness nor edema. Neurologic:  Normal speech and language. No facial assymetry. Tongue midline. PERRL. EOMI. Right upper extremity 4/5. Left upper extremity 5/5. Positive right upper extremity pronator drift. Sensation  to light touch absent in lower right arm. Normal in left arm. Bilateral lower extremity sensation intact and strength 5/5. NIHSS of 4. Skin:  Skin is warm, dry and intact. No rash noted. Psychiatric: Mood and affect are normal. Speech and behavior are normal. Patient exhibits appropriate insight and judgment.  ____________________________________________    LABS (pertinent positives/negatives)  Labs Reviewed  PROTIME-INR -  Abnormal; Notable for the following:    Prothrombin Time 15.4 (*)    All other components within normal limits  APTT - Abnormal; Notable for the following:    aPTT 39 (*)    All other components within normal limits  CBC - Abnormal; Notable for the following:    RBC 3.61 (*)    Hemoglobin 11.6 (*)    HCT 34.7 (*)    RDW 15.8 (*)    All other components within normal limits  DIFFERENTIAL - Abnormal; Notable for the following:    Lymphs Abs 0.8 (*)    All other components within normal limits  COMPREHENSIVE METABOLIC PANEL - Abnormal; Notable for the following:    Potassium 3.2 (*)    Chloride 97 (*)    Glucose, Bld 197 (*)    BUN 26 (*)    Creatinine, Ser 3.15 (*)    Calcium 8.2 (*)    Albumin 3.3 (*)    ALT 8 (*)    GFR calc non Af Amer 13 (*)    GFR calc Af Amer 15 (*)    All other components within normal limits  GLUCOSE, CAPILLARY - Abnormal; Notable for the following:    Glucose-Capillary 189 (*)    All other components within normal limits  HEMOGLOBIN A1C  LIPID PANEL  I-STAT TROPOININ, ED  CBG MONITORING, ED  I-STAT CHEM 8, ED     ____________________________________________   EKG  I, Phineas Semen, attending physician, personally viewed and interpreted this EKG  EKG Time: 1857 Rate: 92 Rhythm: normal sinus rhythm Axis: normal Intervals: qtc 506 QRS: wide, intraventricular conduction delay ST changes: no st elevation Impression: abnormal ekg ____________________________________________    RADIOLOGY  CT  head IMPRESSION: Old left posterior parietal/ occipital infarct and old right parietal infarct.  No acute intracranial abnormality.  I, Phineas Semen, personally discussed these images (CT head) and results by phone with the on-call radiologist and used this discussion as part of my medical decision making.   CT angio neck/head IMPRESSION: 1. No evidence of large vessel occlusion or significant intracranial arterial stenosis. 2. 65% stenosis of the distal left common carotid artery. 3. High-grade stenosis versus short segment occlusion of the left external carotid artery at its origin. ____________________________________________   PROCEDURES  Procedure(s) performed: None  Critical Care performed: Yes, see critical care note(s)  CRITICAL CARE Performed by: Phineas Semen   Total critical care time: 35 minutes  Critical care time was exclusive of separately billable procedures and treating other patients.  Critical care was necessary to treat or prevent imminent or life-threatening deterioration.  Critical care was time spent personally by me on the following activities: development of treatment plan with patient and/or surrogate as well as nursing, discussions with consultants, evaluation of patient's response to treatment, examination of patient, obtaining history from patient or surrogate, ordering and performing treatments and interventions, ordering and review of laboratory studies, ordering and review of radiographic studies, pulse oximetry and re-evaluation of patient's condition.  ____________________________________________   INITIAL IMPRESSION / ASSESSMENT AND PLAN / ED COURSE  Pertinent labs & imaging results that were available during my care of the patient were reviewed by me and considered in my medical decision making (see chart for details).  The patient presented to the emergency department today because of concerns for right arm numbness and  weakness. Exam concerning for acute cerebral accident. She did have significant right arm pronator drift and weakness. Additionally she had subjective decreased sensation. Telemetry neurology  was consulted to evaluate if they thought the patient would be a candidate for TPA. Given that the patient did not have any exact time that the symptoms got worse and that she reported to the neurologist that her last known well time was 1:30 this afternoon she was outside of the window for TPA. The patient did get a CTA per neurology's recommendations which did show some stenosis. No dissection. Will plan on admission to the also service for further stroke workup.  ____________________________________________   FINAL CLINICAL IMPRESSION(S) / ED DIAGNOSES  Final diagnoses:  Right arm weakness  Right arm numbness     Phineas Semen, MD 06/19/15 0128

## 2015-06-18 NOTE — H&P (Signed)
[facility] - [place] at [facility]   PATIENT NAME: [name]    MR#:  [id number]  DATE OF BIRTH:  07-28-1933  DATE OF ADMISSION:  06/18/2015  PRIMARY CARE PHYSICIAN: [name], MD   REQUESTING/REFERRING PHYSICIAN: Derrill Kay  CHIEF COMPLAINT:  Right upper extremity weakness  HISTORY OF PRESENT ILLNESS:  [name]  is a 82 y.o. female with a known history of end-stage renal disease, on hemodialysis on Tuesday Thursday and Saturday, hypertension, diabetes mellitus, past medical history of stroke and multiple other medical problems is presenting to the ED with a chief complaint of right upper extremity weakness. Patient was having right upper extremity weakness soon after hemodialysis today. Right arm was numb and weak. The right hand grip strength is decreased. Denies any dysphagia or dysarthria. Denies any blurry vision. Initial CT head is negative. ED physician has discussed with on-call neurologist was recommended CT angiogram of the neck as the patient was complaining of neck pain, dissection was ruled out  PAST MEDICAL HISTORY:   Past Medical History  Diagnosis Date  . Chronic kidney disease   . Dysrhythmia   . Diabetes mellitus without complication (HCC)   . Hypertension   . Stroke (HCC)   . Arthritis     gout  . Dialysis patient (HCC)   . Pleural effusion   . Pulmonary hypertension (HCC)   . Renal insufficiency     PAST SURGICAL HISTOIRY:   Past Surgical History  Procedure Laterality Date  . Cholecystectomy    . Peripheral vascular catheterization N/A 10/08/2014    Procedure: A/V Shuntogram/Fistulagram;  Surgeon: Annice Needy, MD;  Location: [facility] INVASIVE CV LAB;  Service: Cardiovascular;  Laterality: N/A;  . Joint replacement      bilateral hip  . Peripheral vascular catheterization N/A 03/04/2015    Procedure: A/V Shuntogram/Fistulagram;  Surgeon: Annice Needy, MD;  Location: [facility] INVASIVE CV LAB;  Service: Cardiovascular;   Laterality: N/A;  . Peripheral vascular catheterization N/A 03/04/2015    Procedure: A/V Shunt Intervention;  Surgeon: Annice Needy, MD;  Location: [facility] INVASIVE CV LAB;  Service: Cardiovascular;  Laterality: N/A;  . Fistulagram ([facility] hx)    . Parathyroidectomy    . Tonsillectomy    . Cardiac catheterization    . Eye surgery    . Cataract extraction, bilateral    . Amputation toe Left 06/07/2015    Procedure: AMPUTATION TOE;  Surgeon: Gwyneth Revels, DPM;  Location: [facility] ORS;  Service: Podiatry;  Laterality: Left;    SOCIAL HISTORY:   Social History  Substance Use Topics  . Smoking status: Never Smoker   . Smokeless tobacco: Never Used  . Alcohol Use: No    FAMILY HISTORY:  DIABETES AND HTN  DRUG ALLERGIES:   Allergies  Allergen Reactions  . Codeine Other (See Comments)    Reaction:  Confusion/Hallucinations   . Oxycodone Other (See Comments)    Reaction:  Confusion/Hallucinations   . Quinine Derivatives Rash    REVIEW OF SYSTEMS:  CONSTITUTIONAL: No fever, fatigue or weakness.  EYES: No blurred or double vision.  EARS, NOSE, AND THROAT: No tinnitus or ear pain.  RESPIRATORY: No cough, shortness of breath, wheezing or hemoptysis.  CARDIOVASCULAR: No chest pain, orthopnea, edema.  GASTROINTESTINAL: No nausea, vomiting, diarrhea or abdominal pain.  GENITOURINARY: No dysuria, hematuria.  ENDOCRINE: No polyuria, nocturia,  HEMATOLOGY: No anemia, easy bruising or bleeding SKIN: No rash or lesion. MUSCULOSKELETAL: No joint pain or arthritis.   NEUROLOGIC: Reporting  right upper extremity weakness with tingling  PSYCHIATRY: No anxiety or depression.   MEDICATIONS AT HOME:   Prior to Admission medications   Medication Sig Start Date End Date Taking? Authorizing Provider  acetaminophen (TYLENOL) 500 MG tablet Take 1,000 mg by mouth 2 (two) times daily.    Yes Historical Provider, MD  Biotin 1 MG CAPS Take 1 mg by mouth daily.    Yes Historical Provider, MD  bisacodyl  (DULCOLAX) 5 MG EC tablet Take 10 mg by mouth daily as needed for moderate constipation.    Yes Historical Provider, MD  calcium acetate, Phos Binder, (PHOSLYRA) 667 MG/5ML SOLN Take 1,334 mg by mouth 3 (three) times daily with meals.   Yes Historical Provider, MD  diltiazem (TIAZAC) 120 MG 24 hr capsule Take 120 mg by mouth daily.   Yes Historical Provider, MD  ELIQUIS 2.5 MG TABS tablet Take 2.5 mg by mouth 2 (two) times daily.    Yes Historical Provider, MD  folic acid-vitamin b complex-vitamin c-selenium-zinc (DIALYVITE) 3 MG TABS tablet Take 1 tablet by mouth daily.   Yes Historical Provider, MD  furosemide (LASIX) 80 MG tablet Take 80 mg by mouth 2 (two) times daily.   Yes Historical Provider, MD  gabapentin (NEURONTIN) 100 MG capsule Take 100 mg by mouth at bedtime. Pt takes on Sunday, Monday, Wednesday, and Friday.   Yes Historical Provider, MD  gabapentin (NEURONTIN) 300 MG capsule Take 300 mg by mouth at bedtime. Pt takes on Tuesday, Thursday, and Saturday.   Yes Historical Provider, MD  Insulin Detemir (LEVEMIR) 100 UNIT/ML Pen Inject 10-20 Units into the skin 2 (two) times daily. Pt uses based on her blood sugar.   Yes Historical Provider, MD  lidocaine-prilocaine (EMLA) cream Apply 1 application topically as needed (prior to accessing port).    Yes Historical Provider, MD  Omega-3 Fatty Acids (FISH OIL) 1200 MG CAPS Take 1,200 mg by mouth daily.    Yes Historical Provider, MD  PARoxetine (PAXIL) 40 MG tablet Take 40 mg by mouth at bedtime.    Yes Historical Provider, MD  ranitidine (ZANTAC) 150 MG tablet Take 150 mg by mouth at bedtime.   Yes Historical Provider, MD      VITAL SIGNS:  Blood pressure 155/64, pulse 96, temperature 98.7 F (37.1 C), temperature source Oral, resp. rate 18, height 5\' 5"  (1.651 m), weight 72.576 kg (160 lb), SpO2 100 %.  PHYSICAL EXAMINATION:  GENERAL:  82 y.o.-year-old patient lying in the bed with no acute distress.  EYES: Pupils equal, round, reactive  to light and accommodation. No scleral icterus. Extraocular muscles intact.  HEENT: Head atraumatic, normocephalic. Oropharynx and nasopharynx clear.  NECK:  Supple, no jugular venous distention. No thyroid enlargement, no tenderness.  LUNGS: Normal breath sounds bilaterally, no wheezing, rales,rhonchi or crepitation. No use of accessory muscles of respiration.  CARDIOVASCULAR: S1, S2 normal. No murmurs, rubs, or gallops.  ABDOMEN: Soft, nontender, nondistended. Bowel sounds present. No organomegaly or mass.  EXTREMITIES: No pedal edema, cyanosis, or clubbing.  NEUROLOGIC: Cranial nerves II through XII are intact. Muscle strength 5/5 in all extremities except right upper extremity with motor 3 out of 5, AV fistula is intact. Sensation intact. Gait not checked.  PSYCHIATRIC: The patient is alert and oriented x 3.  SKIN: No obvious rash, lesion, or ulcer.   LABORATORY PANEL:   CBC  Recent Labs Lab 06/18/15 1804  WBC 8.1  HGB 11.6*  HCT 34.7*  PLT 269   ------------------------------------------------------------------------------------------------------------------  Chemistries  Recent Labs Lab 06/18/15 1804  NA 135  K 3.2*  CL 97*  CO2 25  GLUCOSE 197*  BUN 26*  CREATININE 3.15*  CALCIUM 8.2*  AST 20  ALT 8*  ALKPHOS 70  BILITOT 0.6   ------------------------------------------------------------------------------------------------------------------  Cardiac Enzymes No results for input(s): TROPONINI in the last 168 hours. ------------------------------------------------------------------------------------------------------------------  RADIOLOGY:  Ct Angio Head W/cm &/or Wo Cm  06/18/2015  CLINICAL DATA:  Right arm numbness and weakness. EXAM: CT ANGIOGRAPHY HEAD AND NECK TECHNIQUE: Multidetector CT imaging of the head and neck was performed using the standard protocol during bolus administration of intravenous contrast. Multiplanar CT image reconstructions and MIPs  were obtained to evaluate the vascular anatomy. Carotid stenosis measurements (when applicable) are obtained utilizing NASCET criteria, using the distal internal carotid diameter as the denominator. CONTRAST:  OMNIPAQUE IOHEXOL 350 MG/ML SOLN COMPARISON:  Noncontrast head CT earlier today FINDINGS: CTA NECK Aortic arch: 3 vessel aortic arch with moderate calcified plaque. Brachiocephalic and subclavian arteries are patent. Calcified plaque in the proximal left subclavian artery results in less than 50% stenosis. Right carotid system: Common carotid artery is widely patent. There is mild non stenotic plaque in the proximal ICA. There is mild stenosis of the ECA origin due to calcified plaque. Left carotid system: Calcified plaque about the carotid bifurcation results in approximately 65% stenosis of the distal left common carotid artery just proximal to the bifurcation. There is less than 50% stenosis of the proximal ICA due to calcified plaque. There is either short segment occlusion of the ECA at its origin with distal reconstitution versus a high-grade origin stenosis with obscuration of the residual patent lumen by the densely calcified plaque. Vertebral arteries: The vertebral arteries are patent with the left being minimally larger than the right. Focal calcified plaque near the right vertebral artery origin does not result in significant stenosis. Skeleton: Moderate multilevel cervical disc and facet degeneration are present. Prominent degenerative changes are also noted about the dens. Other neck: Mosaic attenuation of the visualized upper lobes is similar to the prior chest CT of 12/12/2014. A small amount of fluid is partially visualized in the superior aspect of the right major fissure. Enlargement of the main pulmonary artery is suggestive of pulmonary arterial hypertension. Multiple thyroid nodules are again seen, including a 2.9 cm nodule on the right, similar to prior chest CT. CTA HEAD Anterior  circulation: The internal carotid arteries are patent. There is mild-to-moderate carotid siphon calcification bilaterally without evidence of significant stenosis. ACAs and MCAs are patent without evidence of major branch occlusion or significant proximal stenosis. No intracranial aneurysm is identified. Posterior circulation: The intracranial vertebral arteries are widely patent to the basilar. PICA, AICA, and SCA origins are patent. Basilar artery is patent without stenosis. There are posterior communicating arteries bilaterally. PCAs are patent without evidence of significant stenosis. Venous sinuses: Hypoplastic left transverse sinus. No evidence of thrombosis. Anatomic variants: None of significance. Delayed phase: No abnormal enhancement. 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 No evidence of acute large territory infarct, intracranial hemorrhage, mass, midline shift, or extra-axial fluid collection. IMPRESSION: 1. No evidence of large vessel occlusion or significant intracranial arterial stenosis. 2. 65% stenosis of the distal left common carotid artery. 3. High-grade stenosis versus short segment occlusion of the left external carotid artery at its origin. Electronically Signed   By: Sebastian Ache M.D.   On: 06/18/2015 19:57   Ct Head Wo Contrast  06/18/2015  CLINICAL DATA:  Right arm numbness, weakness  starting at 2 p.m. today. EXAM: CT HEAD WITHOUT CONTRAST TECHNIQUE: Contiguous axial images were obtained from the base of the skull through the vertex without intravenous contrast. COMPARISON:  None. FINDINGS: Old left occipital and posterior parietal infarct with encephalomalacia. Diffuse cerebral atrophy. Old right parietal infarct. No acute infarct. No hemorrhage or hydrocephalus. No acute calvarial abnormality. IMPRESSION: Old left posterior parietal/ occipital infarct and old right parietal infarct. No acute intracranial abnormality. Critical Value/emergent results were called by telephone  at the time of interpretation on 06/18/2015 at 5:53 pm to Dr. Phineas Semen , who verbally acknowledged these results. Electronically Signed   By: Charlett Nose M.D.   On: 06/18/2015 17:55   Ct Angio Neck W/cm &/or Wo/cm  06/18/2015  CLINICAL DATA:  Right arm numbness and weakness. EXAM: CT ANGIOGRAPHY HEAD AND NECK TECHNIQUE: Multidetector CT imaging of the head and neck was performed using the standard protocol during bolus administration of intravenous contrast. Multiplanar CT image reconstructions and MIPs were obtained to evaluate the vascular anatomy. Carotid stenosis measurements (when applicable) are obtained utilizing NASCET criteria, using the distal internal carotid diameter as the denominator. CONTRAST:  OMNIPAQUE IOHEXOL 350 MG/ML SOLN COMPARISON:  Noncontrast head CT earlier today FINDINGS: CTA NECK Aortic arch: 3 vessel aortic arch with moderate calcified plaque. Brachiocephalic and subclavian arteries are patent. Calcified plaque in the proximal left subclavian artery results in less than 50% stenosis. Right carotid system: Common carotid artery is widely patent. There is mild non stenotic plaque in the proximal ICA. There is mild stenosis of the ECA origin due to calcified plaque. Left carotid system: Calcified plaque about the carotid bifurcation results in approximately 65% stenosis of the distal left common carotid artery just proximal to the bifurcation. There is less than 50% stenosis of the proximal ICA due to calcified plaque. There is either short segment occlusion of the ECA at its origin with distal reconstitution versus a high-grade origin stenosis with obscuration of the residual patent lumen by the densely calcified plaque. Vertebral arteries: The vertebral arteries are patent with the left being minimally larger than the right. Focal calcified plaque near the right vertebral artery origin does not result in significant stenosis. Skeleton: Moderate multilevel cervical disc and  facet degeneration are present. Prominent degenerative changes are also noted about the dens. Other neck: Mosaic attenuation of the visualized upper lobes is similar to the prior chest CT of 12/12/2014. A small amount of fluid is partially visualized in the superior aspect of the right major fissure. Enlargement of the main pulmonary artery is suggestive of pulmonary arterial hypertension. Multiple thyroid nodules are again seen, including a 2.9 cm nodule on the right, similar to prior chest CT. CTA HEAD Anterior circulation: The internal carotid arteries are patent. There is mild-to-moderate carotid siphon calcification bilaterally without evidence of significant stenosis. ACAs and MCAs are patent without evidence of major branch occlusion or significant proximal stenosis. No intracranial aneurysm is identified. Posterior circulation: The intracranial vertebral arteries are widely patent to the basilar. PICA, AICA, and SCA origins are patent. Basilar artery is patent without stenosis. There are posterior communicating arteries bilaterally. PCAs are patent without evidence of significant stenosis. Venous sinuses: Hypoplastic left transverse sinus. No evidence of thrombosis. Anatomic variants: None of significance. Delayed phase: No abnormal enhancement. 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 No evidence of acute large territory infarct, intracranial hemorrhage, mass, midline shift, or extra-axial fluid collection. IMPRESSION: 1. No evidence of large vessel occlusion or significant intracranial arterial stenosis.  2. 65% stenosis of the distal left common carotid artery. 3. High-grade stenosis versus short segment occlusion of the left external carotid artery at its origin. Electronically Signed   By: Sebastian Ache M.D.   On: 06/18/2015 19:57    EKG:   Orders placed or performed during the hospital encounter of 06/18/15  . ED EKG  . ED EKG  . EKG 12-Lead  . EKG 12-Lead    IMPRESSION AND PLAN:    1. TIA versus CVA with acute right upper extremity weakness with history of old stroke Differential diagnosis mechanical problem with AV fistula the right upper extremity CT head, CT angiogram of the neck are normal Will obtain echocardiogram Neuro checks Consult neurology Rounding physician to consider MRI of the brain if symptoms recur Will give aspirin and check fasting lipid panel and patient will be on statin PT consult and bedside swallow evaluation by RN, if positive make patient nothing by mouth and consult speech pathology Consult restless surgery regarding right upper extremity weakness in view of AV fistula  2. Hypokalemia Replete potassium and check a.m. Labs  3. End stage renal disease on hemodialysis on Tuesday Thursday and Saturday. Consult nephrology  4. Diabetes mellitus Will provide sliding scale insulin and check hemoglobin A1c Resume Levemir  5. Essential hypertension Resume all medications and titrate as needed We will allow permissive hypertension as we are ruling out TIA versus CVA at this time    All the records are reviewed and case discussed with ED provider. Management plans discussed with the patient, family and they are in agreement.  CODE STATUS: dnr/daughter HCPOA  TOTAL TIME TAKING CARE OF THIS PATIENT: 45 minutes.    Ramonita Lab M.D on 06/18/2015 at 9:28 PM  Between 7am to 6pm - Pager - [phone]  After 6pm go to [email or web address] - password [email or web address] [facility]  [facility]  Office  [phone]  CC: Primary care physician; [name], MD

## 2015-06-18 NOTE — ED Notes (Signed)
Pt was at home after dialysis and went to pick up a bag and right arm when numb and weak. Unable to hold arm up at all in triage. Does have weak grip strength in right hand. 1742 called code stroke. 1745 arrived to ct. Then to room 24. Dr Derrill Kay at bedside [phone]

## 2015-06-19 ENCOUNTER — Observation Stay
Admit: 2015-06-19 | Discharge: 2015-06-19 | Disposition: A | Discharge status: Home / Self Care | Payer: Medicare Other | Attending: Internal Medicine | Admitting: Internal Medicine

## 2015-06-19 DIAGNOSIS — G459 Transient cerebral ischemic attack, unspecified: Secondary | ICD-10-CM

## 2015-06-19 DIAGNOSIS — L8990 Pressure ulcer of unspecified site, unspecified stage: Secondary | ICD-10-CM

## 2015-06-19 LAB — LIPID PANEL
Cholesterol: 125 mg/dL (ref 0–200)
HDL: 35 mg/dL — AB (ref >40)
LDL CALC: 61 mg/dL (ref 0–99)
Total CHOL/HDL Ratio: 3.6 RATIO
Triglycerides: 144 mg/dL (ref <150)
VLDL: 29 mg/dL (ref 0–40)

## 2015-06-19 LAB — GLUCOSE, CAPILLARY
GLUCOSE-CAPILLARY: 152 mg/dL — AB (ref 65–99)
GLUCOSE-CAPILLARY: 120 mg/dL — AB (ref 65–99)

## 2015-06-19 LAB — HEMOGLOBIN A1C: HEMOGLOBIN A1C: 6.6 % — AB (ref 4.0–6.0)

## 2015-06-19 MED ORDER — GABAPENTIN 100 MG PO CAPS: 100.0000 mg | ORAL_CAPSULE | Freq: Every day | ORAL | Status: DC

## 2015-06-19 MED ORDER — CALCIUM ACETATE (PHOS BINDER) 667 MG/5ML PO SOLN
1334.0000 mg | Freq: Three times a day (TID) | ORAL | Status: DC
  Administered 2015-06-20 – 2015-06-24 (×13): 1334 mg via ORAL
  Filled 2015-06-19 (×29): qty 10

## 2015-06-19 MED ORDER — PAROXETINE HCL 20 MG PO TABS
40.0000 mg | ORAL_TABLET | Freq: Every day | ORAL | Status: DC
  Administered 2015-06-19 – 2015-06-23 (×5): 40 mg via ORAL
  Filled 2015-06-19 (×5): qty 2

## 2015-06-19 MED ORDER — DILTIAZEM HCL ER COATED BEADS 120 MG PO CP24
120.0000 mg | ORAL_CAPSULE | Freq: Every day | ORAL | Status: DC
  Administered 2015-06-19 – 2015-06-24 (×4): 120 mg via ORAL
  Filled 2015-06-19 (×11): qty 1

## 2015-06-19 MED ORDER — INSULIN ASPART 100 UNIT/ML ~~LOC~~ SOLN: 0.0000 [IU] | Freq: Every day | SUBCUTANEOUS | Status: DC

## 2015-06-19 MED ORDER — BIOTIN 1 MG PO CAPS: 1.0000 mg | ORAL_CAPSULE | Freq: Every day | ORAL | Status: DC

## 2015-06-19 MED ORDER — FUROSEMIDE 80 MG PO TABS
80.0000 mg | ORAL_TABLET | Freq: Two times a day (BID) | ORAL | Status: DC
  Administered 2015-06-19 – 2015-06-24 (×10): 80 mg via ORAL
  Filled 2015-06-19: qty 1
  Filled 2015-06-19: qty 2
  Filled 2015-06-19 (×3): qty 1
  Filled 2015-06-19: qty 2
  Filled 2015-06-19 (×6): qty 1
  Filled 2015-06-19: qty 2
  Filled 2015-06-19 (×2): qty 1

## 2015-06-19 MED ORDER — GABAPENTIN 100 MG PO CAPS
100.0000 mg | ORAL_CAPSULE | ORAL | Status: DC
  Administered 2015-06-19 – 2015-06-23 (×3): 100 mg via ORAL
  Filled 2015-06-19 (×3): qty 1

## 2015-06-19 MED ORDER — GABAPENTIN 300 MG PO CAPS: 300.0000 mg | ORAL_CAPSULE | Freq: Every day | ORAL | Status: DC

## 2015-06-19 MED ORDER — GABAPENTIN 300 MG PO CAPS
300.0000 mg | ORAL_CAPSULE | ORAL | Status: DC
  Administered 2015-06-20 – 2015-06-22 (×2): 300 mg via ORAL
  Filled 2015-06-19 (×2): qty 1

## 2015-06-19 MED ORDER — INSULIN ASPART 100 UNIT/ML ~~LOC~~ SOLN
0.0000 [IU] | Freq: Three times a day (TID) | SUBCUTANEOUS | Status: DC
  Administered 2015-06-19 – 2015-06-20 (×2): 2 [IU] via SUBCUTANEOUS
  Administered 2015-06-21: 11:00:00 3 [IU] via SUBCUTANEOUS
  Administered 2015-06-21 – 2015-06-22 (×2): 1 [IU] via SUBCUTANEOUS
  Administered 2015-06-23 – 2015-06-24 (×3): 2 [IU] via SUBCUTANEOUS
  Administered 2015-06-24: 09:00:00 1 [IU] via SUBCUTANEOUS
  Administered 2015-06-24: 12:00:00 2 [IU] via SUBCUTANEOUS
  Filled 2015-06-19 (×3): qty 2
  Filled 2015-06-19 (×2): qty 1
  Filled 2015-06-19 (×3): qty 2
  Filled 2015-06-19: qty 3
  Filled 2015-06-19 (×2): qty 1

## 2015-06-19 MED ORDER — RENA-VITE PO TABS
1.0000 | ORAL_TABLET | Freq: Every day | ORAL | Status: DC
  Administered 2015-06-19 – 2015-06-24 (×6): 1 via ORAL
  Filled 2015-06-19 (×8): qty 1

## 2015-06-19 MED ORDER — FAMOTIDINE 20 MG PO TABS
20.0000 mg | ORAL_TABLET | Freq: Every day | ORAL | Status: DC
  Administered 2015-06-19 – 2015-06-24 (×6): 20 mg via ORAL
  Filled 2015-06-19 (×6): qty 1

## 2015-06-19 MED ORDER — TRAMADOL HCL 50 MG PO TABS
50.0000 mg | ORAL_TABLET | Freq: Two times a day (BID) | ORAL | Status: DC | PRN
  Administered 2015-06-20 – 2015-06-22 (×2): 50 mg via ORAL
  Filled 2015-06-19 (×2): qty 1

## 2015-06-19 MED ORDER — CALCIUM ACETATE (PHOS BINDER) 667 MG/5ML PO SOLN
1334.0000 mg | Freq: Three times a day (TID) | ORAL | Status: DC
  Administered 2015-06-19 – 2015-06-20 (×2): 1334 mg via ORAL
  Filled 2015-06-19 (×5): qty 10

## 2015-06-19 MED ORDER — HEPARIN SODIUM (PORCINE) 5000 UNIT/ML IJ SOLN
5000.0000 [IU] | Freq: Three times a day (TID) | INTRAMUSCULAR | Status: DC
  Administered 2015-06-19 – 2015-06-20 (×2): 5000 [IU] via SUBCUTANEOUS
  Filled 2015-06-19 (×2): qty 1

## 2015-06-19 NOTE — Consult Note (Addendum)
Referring Physician: Cherlynn Kaiser    Chief Complaint: Right arm weakness  HPI: [name] is an 82 y.o. female who reports going to dialysis yesterday.  She had a hard time with her session.  BP dropped to SBP of 50 per son.  Patient complained of dizziness and remained dizzy on the way home by transport.  At home she had teh acute onset of  RUE weakness and numbness.  Describes entire arm and hand being involved.  No change in speech or gait.  Patient has had a stroke in the past that affected her right side from which she recovered.  Patient called son who brought her in for further evaluation.  Date last known well: 06/18/2015 Time last known well: Time: 15:00 tPA Given: No: Patient on Eliquis  Past Medical History  Diagnosis Date  . Chronic kidney disease   . Dysrhythmia   . Diabetes mellitus without complication (HCC)   . Hypertension   . Stroke (HCC)   . Arthritis     gout  . Dialysis patient (HCC)   . Pleural effusion   . Pulmonary hypertension (HCC)   . Renal insufficiency     Past Surgical History  Procedure Laterality Date  . Cholecystectomy    . Peripheral vascular catheterization N/A 10/08/2014    Procedure: A/V Shuntogram/Fistulagram;  Surgeon: Annice Needy, MD;  Location: [facility] INVASIVE CV LAB;  Service: Cardiovascular;  Laterality: N/A;  . Joint replacement      bilateral hip  . Peripheral vascular catheterization N/A 03/04/2015    Procedure: A/V Shuntogram/Fistulagram;  Surgeon: Annice Needy, MD;  Location: [facility] INV[facility]IVE CV LAB;  Service: Cardiovascular;  Laterality: N/A;  . Peripheral vascular catheterization N/A 03/04/2015    Procedure: A/V Shunt Intervention;  Surgeon: Annice Needy, MD;  Location: [facility] INVASIVE CV LAB;  Service: Cardiovascular;  Laterality: N/A;  . Fistulagram ([facility] hx)    . Parathyroidectomy    . Tonsillectomy    . Cardiac catheterization    . Eye surgery    . Cataract extraction, bilateral    . Amputation toe Left 06/07/2015    Procedure:  AMPUTATION TOE;  Surgeon: Gwyneth Revels, DPM;  Location: [facility] ORS;  Service: Podiatry;  Laterality: Left;    Family history: Father died after having multiple strokes.  Mother died of colon cancer.   Social History:  reports that she has never smoked. She has never used smokeless tobacco. She reports that she does not drink alcohol or use illicit drugs.  Allergies:  Allergies  Allergen Reactions  . Codeine Other (See Comments)    Reaction:  Confusion/Hallucinations   . Oxycodone Other (See Comments)    Reaction:  Confusion/Hallucinations   . Quinine Derivatives Rash    Medications:  I have reviewed the patient's current medications. Prior to Admission:  Prescriptions prior to admission  Medication Sig Dispense Refill Last Dose  . acetaminophen (TYLENOL) 500 MG tablet Take 1,000 mg by mouth 2 (two) times daily.    06/18/2015 at 0700  . Biotin 1 MG CAPS Take 1 mg by mouth daily.    06/17/2015 at Unknown time  . bisacodyl (DULCOLAX) 5 MG EC tablet Take 10 mg by mouth daily as needed for moderate constipation.    Past Week at Unknown time  . calcium acetate, Phos Binder, (PHOSLYRA) 667 MG/5ML SOLN Take 1,334 mg by mouth 3 (three) times daily with meals.   06/17/2015 at Unknown time  . diltiazem (TIAZAC) 120 MG 24 hr capsule Take 120  mg by mouth daily.   06/17/2015 at Unknown time  . ELIQUIS 2.5 MG TABS tablet Take 2.5 mg by mouth 2 (two) times daily.    06/17/2015 at 2200  . folic acid-vitamin b complex-vitamin c-selenium-zinc (DIALYVITE) 3 MG TABS tablet Take 1 tablet by mouth daily.   06/17/2015 at Unknown time  . furosemide (LASIX) 80 MG tablet Take 80 mg by mouth 2 (two) times daily.   06/17/2015 at Unknown time  . gabapentin (NEURONTIN) 100 MG capsule Take 100 mg by mouth at bedtime. Pt takes on Sunday, Monday, Wednesday, and Friday.   06/17/2015 at Unknown time  . gabapentin (NEURONTIN) 300 MG capsule Take 300 mg by mouth at bedtime. Pt takes on Tuesday, Thursday, and Saturday.   Past Week  at Unknown time  . Insulin Detemir (LEVEMIR) 100 UNIT/ML Pen Inject 10-20 Units into the skin 2 (two) times daily. Pt uses based on her blood sugar.   06/18/2015 at Unknown time  . lidocaine-prilocaine (EMLA) cream Apply 1 application topically as needed (prior to accessing port).    PRN at PRN  . Omega-3 Fatty Acids (FISH OIL) 1200 MG CAPS Take 1,200 mg by mouth daily.    Past Week at Unknown time  . PARoxetine (PAXIL) 40 MG tablet Take 40 mg by mouth at bedtime.    06/17/2015 at Unknown time  . ranitidine (ZANTAC) 150 MG tablet Take 150 mg by mouth at bedtime.   06/17/2015 at Unknown time   Scheduled: . aspirin  81 mg Oral Daily  . enoxaparin (LOVENOX) injection  30 mg Subcutaneous Q24H    ROS: History obtained from the patient  General ROS: negative for - chills, fatigue, fever, night sweats, weight gain or weight loss Psychological ROS: negative for - behavioral disorder, hallucinations, memory difficulties, mood swings or suicidal ideation Ophthalmic ROS: negative for - blurry vision, double vision, eye pain or loss of vision ENT ROS: negative for - epistaxis, nasal discharge, oral lesions, sore throat, tinnitus or vertigo Allergy and Immunology ROS: negative for - hives or itchy/watery eyes Hematological and Lymphatic ROS: negative for - bleeding problems, bruising or swollen lymph nodes Endocrine ROS: negative for - galactorrhea, hair pattern changes, polydipsia/polyuria or temperature intolerance Respiratory ROS: negative for - cough, hemoptysis, shortness of breath or wheezing Cardiovascular ROS: negative for - chest pain, dyspnea on exertion, edema or irregular heartbeat Gastrointestinal ROS: negative for - abdominal pain, diarrhea, hematemesis, nausea/vomiting or stool incontinence Genito-Urinary ROS: negative for - dysuria, hematuria, incontinence or urinary frequency/urgency Musculoskeletal ROS: recent left toe amputation.  RUE thrill present.   Neurological ROS: as noted in  HPI Dermatological ROS: negative for rash and skin lesion changes  Physical Examination: Blood pressure 115/47, pulse 89, temperature 98.5 F (36.9 C), temperature source Oral, resp. rate 20, height 5\' 7"  (1.702 m), weight 73.619 kg (162 lb 4.8 oz), SpO2 96 %.  HEENT-  Normocephalic, no lesions, without obvious abnormality.  Normal external eye and conjunctiva.  Normal TM's bilaterally.  Normal auditory canals and external ears. Normal external nose, mucus membranes and septum.  Normal pharynx. Cardiovascular- S1, S2 normal, pulses palpable throughout   Lungs- chest clear, no wheezing, rales, normal symmetric air entry Abdomen- soft, non-tender; bowel sounds normal; no masses,  no organomegaly Extremities- bandaging on left foot Lymph-no adenopathy palpable Musculoskeletal-shoulder pain to palpation and with movement.   Skin-skin changes below the knees in both lower extremities  Neurological Examination Mental Status: Alert, oriented, thought content appropriate.  Speech fluent without evidence of  aphasia.  Able to follow 3 step commands without difficulty. Cranial Nerves: II: Discs flat bilaterally; Visual fields grossly normal, left pupil 3mm, right pupil 2mm.  Neither reactive.   III,IV, VI: ptosis not present, extra-ocular motions intact bilaterally V,VII: decrease in right NLF, facial light touch sensation normal bilaterally VIII: hearing normal bilaterally IX,X: gag reflex present XI: bilateral shoulder shrug XII: midline tongue extension Motor: Right : Upper extremity   3-/5    Left:     Upper extremity   5/5  Lower extremity   5/5     Lower extremity   5/5 Tone and bulk:normal tone throughout; no atrophy noted Sensory: Pinprick and light touch decreased in the RUE Deep Tendon Reflexes: 2+ in the upper extremities and absent in the lower extremities.   Plantars: Right: mute   Left: mute Cerebellar: normal finger-to-nose using the LUE and dysmetria with heel-to-shin testing  bilaterally Gait: not tested due to safety concerns, patient not very ambulatory at baseline.      Laboratory Studies:  Basic Metabolic Panel:  Recent Labs Lab 06/18/15 1804  NA 135  K 3.2*  CL 97*  CO2 25  GLUCOSE 197*  BUN 26*  CREATININE 3.15*  CALCIUM 8.2*    Liver Function Tests:  Recent Labs Lab 06/18/15 1804  AST 20  ALT 8*  ALKPHOS 70  BILITOT 0.6  PROT 8.1  ALBUMIN 3.3*   No results for input(s): LIPASE, AMYLASE in the last 168 hours. No results for input(s): AMMONIA in the last 168 hours.  CBC:  Recent Labs Lab 06/18/15 1804  WBC 8.1  NEUTROABS 6.4  HGB 11.6*  HCT 34.7*  MCV 96.1  PLT 269    Cardiac Enzymes: No results for input(s): CKTOTAL, CKMB, CKMBINDEX, TROPONINI in the last 168 hours.  BNP: Invalid input(s): POCBNP  CBG:  Recent Labs Lab 06/18/15 1803  GLUCAP 189*    Microbiology: Results for orders placed or performed during the hospital encounter of 06/07/15  Wound culture     Status: None   Collection Time: 06/07/15  1:44 PM  Result Value Ref Range Status   Specimen Description ORTHO B9  Final   Special Requests NONE  Final   Gram Stain RARE WBC SEEN NO ORGANISMS SEEN   Final   Culture   Final    LIGHT GROWTH PROTEUS MIRABILIS LIGHT GROWTH STENOTROPHOMONAS MALTOPHILIA    Report Status 06/14/2015 FINAL  Final   Organism ID, Bacteria PROTEUS MIRABILIS  Final   Organism ID, Bacteria STENOTROPHOMONAS MALTOPHILIA  Final      Susceptibility   Proteus mirabilis - MIC*    AMPICILLIN >=32 RESISTANT Resistant     CEFTAZIDIME <=1 SENSITIVE Sensitive     CEFAZOLIN <=4 SENSITIVE Sensitive     CEFTRIAXONE <=1 SENSITIVE Sensitive     CIPROFLOXACIN >=4 RESISTANT Resistant     GENTAMICIN <=1 SENSITIVE Sensitive     IMIPENEM 2 SENSITIVE Sensitive     TRIMETH/SULFA >=320 RESISTANT Resistant     PIP/TAZO Value in next row Sensitive      SENSITIVE<=4    ERTAPENEM Value in next row Sensitive      SENSITIVE<=0.5    * LIGHT  GROWTH PROTEUS MIRABILIS   Stenotrophomonas maltophilia - MIC*    LEVOFLOXACIN Value in next row Sensitive      SENSITIVE<=0.5    TRIMETH/SULFA Value in next row Sensitive      SENSITIVE<=0.5    * LIGHT GROWTH STENOTROPHOMONAS MALTOPHILIA    Coagulation Studies:  Recent  Labs  06/18/15 1804  LABPROT 15.4*  INR 1.20    Urinalysis: No results for input(s): COLORURINE, LABSPEC, PHURINE, GLUCOSEU, HGBUR, BILIRUBINUR, KETONESUR, PROTEINUR, UROBILINOGEN, NITRITE, LEUKOCYTESUR in the last 168 hours.  Invalid input(s): APPERANCEUR  Lipid Panel:    Component Value Date/Time   CHOL 125 06/19/2015 0616   CHOL 119 06/20/2013 0540   TRIG 144 06/19/2015 0616   TRIG 95 06/20/2013 0540   HDL 35* 06/19/2015 0616   HDL 57 06/20/2013 0540   CHOLHDL 3.6 06/19/2015 0616   VLDL 29 06/19/2015 0616   VLDL 19 06/20/2013 0540   LDLCALC 61 06/19/2015 0616   LDLCALC 43 06/20/2013 0540    HgbA1C:  Lab Results  Component Value Date   HGBA1C 6.5* 12/01/2013    Urine Drug Screen:  No results found for: LABOPIA, COCAINSCRNUR, LABBENZ, AMPHETMU, THCU, LABBARB  Alcohol Level: No results for input(s): ETH in the last 168 hours.  Other results: EKG: atrial premature complexes.  Imaging: Ct Angio Head W/cm &/or Wo Cm  06/18/2015  CLINICAL DATA:  Right arm numbness and weakness. EXAM: CT ANGIOGRAPHY HEAD AND NECK TECHNIQUE: Multidetector CT imaging of the head and neck was performed using the standard protocol during bolus administration of intravenous contrast. Multiplanar CT image reconstructions and MIPs were obtained to evaluate the vascular anatomy. Carotid stenosis measurements (when applicable) are obtained utilizing NASCET criteria, using the distal internal carotid diameter as the denominator. CONTRAST:  OMNIPAQUE IOHEXOL 350 MG/ML SOLN COMPARISON:  Noncontrast head CT earlier today FINDINGS: CTA NECK Aortic arch: 3 vessel aortic arch with moderate calcified plaque. Brachiocephalic and  subclavian arteries are patent. Calcified plaque in the proximal left subclavian artery results in less than 50% stenosis. Right carotid system: Common carotid artery is widely patent. There is mild non stenotic plaque in the proximal ICA. There is mild stenosis of the ECA origin due to calcified plaque. Left carotid system: Calcified plaque about the carotid bifurcation results in approximately 65% stenosis of the distal left common carotid artery just proximal to the bifurcation. There is less than 50% stenosis of the proximal ICA due to calcified plaque. There is either short segment occlusion of the ECA at its origin with distal reconstitution versus a high-grade origin stenosis with obscuration of the residual patent lumen by the densely calcified plaque. Vertebral arteries: The vertebral arteries are patent with the left being minimally larger than the right. Focal calcified plaque near the right vertebral artery origin does not result in significant stenosis. Skeleton: Moderate multilevel cervical disc and facet degeneration are present. Prominent degenerative changes are also noted about the dens. Other neck: Mosaic attenuation of the visualized upper lobes is similar to the prior chest CT of 12/12/2014. A small amount of fluid is partially visualized in the superior aspect of the right major fissure. Enlargement of the main pulmonary artery is suggestive of pulmonary arterial hypertension. Multiple thyroid nodules are again seen, including a 2.9 cm nodule on the right, similar to prior chest CT. CTA HEAD Anterior circulation: The internal carotid arteries are patent. There is mild-to-moderate carotid siphon calcification bilaterally without evidence of significant stenosis. ACAs and MCAs are patent without evidence of major branch occlusion or significant proximal stenosis. No intracranial aneurysm is identified. Posterior circulation: The intracranial vertebral arteries are widely patent to the basilar.  PICA, AICA, and SCA origins are patent. Basilar artery is patent without stenosis. There are posterior communicating arteries bilaterally. PCAs are patent without evidence of significant stenosis. Venous sinuses: Hypoplastic left transverse  sinus. No evidence of thrombosis. Anatomic variants: None of significance. Delayed phase: No abnormal enhancement. 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 No evidence of acute large territory infarct, intracranial hemorrhage, mass, midline shift, or extra-axial fluid collection. IMPRESSION: 1. No evidence of large vessel occlusion or significant intracranial arterial stenosis. 2. 65% stenosis of the distal left common carotid artery. 3. High-grade stenosis versus short segment occlusion of the left external carotid artery at its origin. Electronically Signed   By: Sebastian Ache M.D.   On: 06/18/2015 19:57   Ct Head Wo Contrast  06/18/2015  CLINICAL DATA:  Right arm numbness, weakness starting at 2 p.m. today. EXAM: CT HEAD WITHOUT CONTRAST TECHNIQUE: Contiguous axial images were obtained from the base of the skull through the vertex without intravenous contrast. COMPARISON:  None. FINDINGS: Old left occipital and posterior parietal infarct with encephalomalacia. Diffuse cerebral atrophy. Old right parietal infarct. No acute infarct. No hemorrhage or hydrocephalus. No acute calvarial abnormality. IMPRESSION: Old left posterior parietal/ occipital infarct and old right parietal infarct. No acute intracranial abnormality. Critical Value/emergent results were called by telephone at the time of interpretation on 06/18/2015 at 5:53 pm to Dr. Phineas Semen , who verbally acknowledged these results. Electronically Signed   By: Charlett Nose M.D.   On: 06/18/2015 17:55   Ct Angio Neck W/cm &/or Wo/cm  06/18/2015  CLINICAL DATA:  Right arm numbness and weakness. EXAM: CT ANGIOGRAPHY HEAD AND NECK TECHNIQUE: Multidetector CT imaging of the head and neck was performed using  the standard protocol during bolus administration of intravenous contrast. Multiplanar CT image reconstructions and MIPs were obtained to evaluate the vascular anatomy. Carotid stenosis measurements (when applicable) are obtained utilizing NASCET criteria, using the distal internal carotid diameter as the denominator. CONTRAST:  OMNIPAQUE IOHEXOL 350 MG/ML SOLN COMPARISON:  Noncontrast head CT earlier today FINDINGS: CTA NECK Aortic arch: 3 vessel aortic arch with moderate calcified plaque. Brachiocephalic and subclavian arteries are patent. Calcified plaque in the proximal left subclavian artery results in less than 50% stenosis. Right carotid system: Common carotid artery is widely patent. There is mild non stenotic plaque in the proximal ICA. There is mild stenosis of the ECA origin due to calcified plaque. Left carotid system: Calcified plaque about the carotid bifurcation results in approximately 65% stenosis of the distal left common carotid artery just proximal to the bifurcation. There is less than 50% stenosis of the proximal ICA due to calcified plaque. There is either short segment occlusion of the ECA at its origin with distal reconstitution versus a high-grade origin stenosis with obscuration of the residual patent lumen by the densely calcified plaque. Vertebral arteries: The vertebral arteries are patent with the left being minimally larger than the right. Focal calcified plaque near the right vertebral artery origin does not result in significant stenosis. Skeleton: Moderate multilevel cervical disc and facet degeneration are present. Prominent degenerative changes are also noted about the dens. Other neck: Mosaic attenuation of the visualized upper lobes is similar to the prior chest CT of 12/12/2014. A small amount of fluid is partially visualized in the superior aspect of the right major fissure. Enlargement of the main pulmonary artery is suggestive of pulmonary arterial hypertension.  Multiple thyroid nodules are again seen, including a 2.9 cm nodule on the right, similar to prior chest CT. CTA HEAD Anterior circulation: The internal carotid arteries are patent. There is mild-to-moderate carotid siphon calcification bilaterally without evidence of significant stenosis. ACAs and MCAs are patent without evidence of  major branch occlusion or significant proximal stenosis. No intracranial aneurysm is identified. Posterior circulation: The intracranial vertebral arteries are widely patent to the basilar. PICA, AICA, and SCA origins are patent. Basilar artery is patent without stenosis. There are posterior communicating arteries bilaterally. PCAs are patent without evidence of significant stenosis. Venous sinuses: Hypoplastic left transverse sinus. No evidence of thrombosis. Anatomic variants: None of significance. Delayed phase: No abnormal enhancement. 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 No evidence of acute large territory infarct, intracranial hemorrhage, mass, midline shift, or extra-axial fluid collection. IMPRESSION: 1. No evidence of large vessel occlusion or significant intracranial arterial stenosis. 2. 65% stenosis of the distal left common carotid artery. 3. High-grade stenosis versus short segment occlusion of the left external carotid artery at its origin. Electronically Signed   By: Sebastian Ache M.D.   On: 06/18/2015 19:57    Assessment: 82 y.o. female presenting with RUE weakness and numbness.  Patient with vascular risk factors and history of stroke in the past.  On Eliquis at home.  Head CT personally reviewed and shows no acute infarct.  Chronic infarcts noted.  CTA shows 65% left common carotid artery stenosis as most significant area of stenosis.  Concern is that with hypotension some focal/segmental hypoperfusion may have occurred.  Further work up recommended.    Stroke Risk Factors - diabetes mellitus and hypertension  Plan: 1. HgbA1c, fasting lipid  panel 2. MRI of the brain without contrast.  If unremarkable, MRI of the cervical spine should be performed. 3. PT consult, OT consult, Speech consult 4. Echocardiogram 5. Prophylactic therapy-Restart Eliquis 6. NPO until RN stroke swallow screen 7. Telemetry monitoring 8. Frequent neuro checks   Thana Farr, MD Neurology [phone] 06/19/2015, 10:54 AM

## 2015-06-19 NOTE — Progress Notes (Signed)
Inpatient Diabetes Program Recommendations  AACE/ADA: New Consensus Statement on Inpatient Glycemic Control (2015)  Target Ranges:  Prepandial:   less than 140 mg/dL      Peak postprandial:   less than 180 mg/dL (1-2 hours)      Critically ill patients:  140 - 180 mg/dL  Results for [name], [name] (MRN [id number]) as of 06/19/2015 12:48  Ref. Range 06/18/2015 18:03  Glucose-Capillary Latest Ref Range: 65-99 mg/dL 409 (H)  Results for [name], [name] (MRN [id number]) as of 06/19/2015 12:48  Ref. Range 06/18/2015 18:04  Glucose Latest Ref Range: 65-99 mg/dL 956 (H)   Review of Glycemic Control  Diabetes history: DM2 Outpatient Diabetes medications: Levemir 10-20 units BID Current orders for Inpatient glycemic control: None  Inpatient Diabetes Program Recommendations: Correction (SSI): Please order CBGs with Novolog correction scale ACHS. HgbA1C: A1C in process.  Thanks, Orlando Penner, RN, MSN, CDE Diabetes Coordinator Inpatient Diabetes Program [phone] (Team Pager from 8am to 5pm) [phone] ([facility] office) [phone] [facility] office) [phone] [facility] office)

## 2015-06-19 NOTE — Consult Note (Signed)
[facility] Vascular Consult Note  MRN : [id number]  [name] is a 82 y.o. (1934/04/16) female who presents with chief complaint of  Chief Complaint  Patient presents with  . arm numbness   .  History of Present Illness: The patient is a 82 year old female with a known history of end-stage renal disease, on hemodialysis; Tuesday Thursday and Saturday, hypertension, diabetes mellitus, past medical history of stroke and multiple other medical problems is presenting to the ED with a chief complaint of right upper extremity weakness. Patient was having right upper extremity weakness right after hemodialysis today which she initially attributed to try to pick a bag up off the back of her chair. She notes that her right arm became abruptly numb and profoundly weak. She is unable to grip with her right hand at all. Of note, the patient adamantly denies any pain whatsoever in the arm hand or fingers. Denies any dysphagia or dysarthria. Denies any blurry vision. Initial CT head is negative.  She notes she began dialysis approximately 2 years ago at which time the right arm brachial axillary dialysis graft was placed. She has been getting hemodialysis without incident lately. She has had several fistulograms in the past but has never had any symptoms of steal. She denies fever chills no problems on dialysis recently.  Current Facility-Administered Medications  Medication Dose Route Frequency Provider Last Rate Last Dose  . aspirin chewable tablet 81 mg  81 mg Oral Daily Ramonita Lab, MD   81 mg at 06/19/15 0835  . calcium acetate (Phos Binder) (PHOSLYRA) 667 MG/5ML oral solution 1,334 mg  1,334 mg Oral TID WC Houston Siren, MD   1,334 mg at 06/19/15 1629  . calcium acetate (Phos Binder) (PHOSLYRA) 667 MG/5ML oral solution 1,334 mg  1,334 mg Oral TID WC Houston Siren, MD   1,334 mg at 06/19/15 1628  . diltiazem (DILACOR XR) 24 hr capsule 120 mg  120 mg Oral Daily Houston Siren, MD   120 mg at 06/19/15 1627  . famotidine (PEPCID) tablet 20 mg  20 mg Oral Daily Houston Siren, MD   20 mg at 06/19/15 1628  . furosemide (LASIX) tablet 80 mg  80 mg Oral BID Houston Siren, MD   80 mg at 06/19/15 1628  . gabapentin (NEURONTIN) capsule 100 mg  100 mg Oral Once per day on Sun Mon Wed Fri Houston Siren, MD      . Melene Muller ON 06/20/2015] gabapentin (NEURONTIN) capsule 300 mg  300 mg Oral Once per day on Tue Thu Sat Houston Siren, MD      . heparin injection 5,000 Units  5,000 Units Subcutaneous 3 times per day Houston Siren, MD      . insulin aspart (novoLOG) injection 0-5 Units  0-5 Units Subcutaneous QHS Houston Siren, MD      . insulin aspart (novoLOG) injection 0-9 Units  0-9 Units Subcutaneous TID WC Houston Siren, MD   2 Units at 06/19/15 1629  . multivitamin (RENA-VIT) tablet 1 tablet  1 tablet Oral Daily Houston Siren, MD   1 tablet at 06/19/15 1627  . PARoxetine (PAXIL) tablet 40 mg  40 mg Oral QHS Houston Siren, MD      . traMADol Janean Sark) tablet 50 mg  50 mg Oral Q12H PRN Houston Siren, MD        Past Medical History  Diagnosis Date  . Chronic kidney disease   .  Dysrhythmia   . Diabetes mellitus without complication (HCC)   . Hypertension   . Stroke (HCC)   . Arthritis     gout  . Dialysis patient (HCC)   . Pleural effusion   . Pulmonary hypertension (HCC)   . Renal insufficiency     Past Surgical History  Procedure Laterality Date  . Cholecystectomy    . Peripheral vascular catheterization N/A 10/08/2014    Procedure: A/V Shuntogram/Fistulagram;  Surgeon: Annice Needy, MD;  Location: [facility] INVASIVE CV LAB;  Service: Cardiovascular;  Laterality: N/A;  . Joint replacement      bilateral hip  . Peripheral vascular catheterization N/A 03/04/2015    Procedure: A/V Shuntogram/Fistulagram;  Surgeon: Annice Needy, MD;  Location: [facility] INVASIVE CV LAB;  Service: Cardiovascular;  Laterality: N/A;  . Peripheral vascular catheterization N/A  03/04/2015    Procedure: A/V Shunt Intervention;  Surgeon: Annice Needy, MD;  Location: [facility] INVASIVE CV LAB;  Service: Cardiovascular;  Laterality: N/A;  . Fistulagram ([facility] hx)    . Parathyroidectomy    . Tonsillectomy    . Cardiac catheterization    . Eye surgery    . Cataract extraction, bilateral    . Amputation toe Left 06/07/2015    Procedure: AMPUTATION TOE;  Surgeon: Gwyneth Revels, DPM;  Location: [facility] ORS;  Service: Podiatry;  Laterality: Left;    Social History Social History  Substance Use Topics  . Smoking status: Never Smoker   . Smokeless tobacco: Never Used  . Alcohol Use: No    Family History History reviewed. No pertinent family history. no family history of porphyria, autoimmune disease or bleeding clotting disorders  Allergies  Allergen Reactions  . Codeine Other (See Comments)    Reaction:  Confusion/Hallucinations   . Oxycodone Other (See Comments)    Reaction:  Confusion/Hallucinations   . Quinine Derivatives Rash     REVIEW OF SYSTEMS (Negative unless checked)  Constitutional: [] Weight loss  [] Fever  [] Chills Cardiac: [] Chest pain   [] Chest pressure   [] Palpitations   [] Shortness of breath when laying flat   [] Shortness of breath at rest   [] Shortness of breath with exertion. Vascular:  [] Pain in legs with walking   [] Pain in legs at rest   [] Pain in legs when laying flat   [] Claudication   [] Pain in feet when walking  [] Pain in feet at rest  [] Pain in feet when laying flat   [] History of DVT   [] Phlebitis   [] Swelling in legs   [] Varicose veins   [] Non-healing ulcers Pulmonary:   [] Uses home oxygen   [] Productive cough   [] Hemoptysis   [] Wheeze  [] COPD   [] Asthma Neurologic:  [] Dizziness  [] Blackouts   [] Seizures   [] History of stroke   [] History of TIA  [] Aphasia   [] Temporary blindness   [] Dysphagia   [x] Weakness or numbness in arms   [] Weakness or numbness in legs Musculoskeletal:  [] Arthritis   [] Joint swelling   [] Joint pain   [] Low back  pain Hematologic:  [] Easy bruising  [] Easy bleeding   [] Hypercoagulable state   [] Anemic  [] Hepatitis Gastrointestinal:  [] Blood in stool   [] Vomiting blood  [] Gastroesophageal reflux/heartburn   [] Difficulty swallowing. Genitourinary:  [] Chronic kidney disease   [] Difficult urination  [] Frequent urination  [] Burning with urination   [] Blood in urine Skin:  [] Rashes   [] Ulcers   [] Wounds Psychological:  [] History of anxiety   []  History of major depression.  Physical Examination  Filed Vitals:   06/19/15 1610 06/19/15  1127 06/19/15 1311 06/19/15 1707  BP: 115/47 125/60 127/53 121/57  Pulse: 89 93 90 91  Temp: 98.5 F (36.9 C) 98.5 F (36.9 C) 98.9 F (37.2 C) 98.1 F (36.7 C)  TempSrc: Oral Oral Oral Oral  Resp: 20 20 20 20   Height:      Weight:      SpO2: 96% 95% 96% 100%   Body mass index is 25.41 kg/(m^2). Gen:  WD/WN, NAD Head: [place]/AT, No temporalis wasting. Prominent temp pulse not noted. Ear/Nose/Throat: Hearing grossly intact, nares w/o erythema or drainage, oropharynx w/o Erythema/Exudate Eyes: PERRLA, EOMI.  Neck: Supple, no nuchal rigidity.  No bruit or JVD.  Pulmonary:  Good air movement, clear to auscultation bilaterally.  Cardiac: RRR, normal S1, S2, no Murmurs, rubs or gallops. Vascular: Right arm brachial axillary graft with good thrill good bruit there is a diffuse redness of the upper arm around the biceps area but there is no induration there is no tenderness and there is minimal warmth. Bilateral carotid bruits noted Vessel Right Left  Radial  not Palpable  not Palpable  Ulnar  not Palpable  not Palpable  Brachial Palpable Palpable  Carotid Palpable, without bruit Palpable, without bruit  Aorta Not palpable N/A  Femoral Palpable Palpable  Popliteal  not Palpable  not Palpable  PT  not Palpable  not Palpable  DP  not Palpable  not Palpable   Gastrointestinal: soft, non-tender/non-distended. No guarding/reflex. No masses, surgical incisions, or  scars. Musculoskeletal: M/S 5/5 throughout.  Extremities without ischemic changes.  No deformity or atrophy. No edema. Neurologic: CN 2-12 intact. Pain and light touch intact in extremities.  Symmetrical.  Speech is fluent. Motor exam as listed above. Psychiatric: Judgment intact, Mood & affect appropriate for pt's clinical situation. Dermatologic: No rashes or ulcers noted.  No cellulitis or open wounds. Lymph : No Cervical, Axillary, or Inguinal lymphadenopathy.    CBC Lab Results  Component Value Date   WBC 8.1 06/18/2015   HGB 11.6* 06/18/2015   HCT 34.7* 06/18/2015   MCV 96.1 06/18/2015   PLT 269 06/18/2015    BMET    Component Value Date/Time   NA 135 06/18/2015 1804   NA 137 07/04/2014 1303   K 3.2* 06/18/2015 1804   K 3.3* 07/04/2014 1303   CL 97* 06/18/2015 1804   CL 100 07/04/2014 1303   CO2 25 06/18/2015 1804   CO2 29 07/04/2014 1303   GLUCOSE 197* 06/18/2015 1804   GLUCOSE 76 07/04/2014 1303   BUN 26* 06/18/2015 1804   BUN 44* 07/04/2014 1303   CREATININE 3.15* 06/18/2015 1804   CREATININE 2.72* 07/04/2014 1303   CALCIUM 8.2* 06/18/2015 1804   CALCIUM 8.9 07/04/2014 1303   GFRNONAA 13* 06/18/2015 1804   GFRNONAA 18* 07/04/2014 1303   GFRNONAA 29* 12/02/2013 0847   GFRAA 15* 06/18/2015 1804   GFRAA 22* 07/04/2014 1303   GFRAA 34* 12/02/2013 0847   Estimated Creatinine Clearance: 14.1 mL/min (by C-G formula based on Cr of 3.15).  COAG Lab Results  Component Value Date   INR 1.20 06/18/2015   INR 1.43 03/04/2015   INR 1.97 12/12/2014    Radiology Ct Angio Head W/cm &/or Wo Cm  06/18/2015  CLINICAL DATA:  Right arm numbness and weakness. EXAM: CT ANGIOGRAPHY HEAD AND NECK TECHNIQUE: Multidetector CT imaging of the head and neck was performed using the standard protocol during bolus administration of intravenous contrast. Multiplanar CT image reconstructions and MIPs were obtained to evaluate the vascular anatomy.  Carotid stenosis measurements (when  applicable) are obtained utilizing NASCET criteria, using the distal internal carotid diameter as the denominator. CONTRAST:  OMNIPAQUE IOHEXOL 350 MG/ML SOLN COMPARISON:  Noncontrast head CT earlier today FINDINGS: CTA NECK Aortic arch: 3 vessel aortic arch with moderate calcified plaque. Brachiocephalic and subclavian arteries are patent. Calcified plaque in the proximal left subclavian artery results in less than 50% stenosis. Right carotid system: Common carotid artery is widely patent. There is mild non stenotic plaque in the proximal ICA. There is mild stenosis of the ECA origin due to calcified plaque. Left carotid system: Calcified plaque about the carotid bifurcation results in approximately 65% stenosis of the distal left common carotid artery just proximal to the bifurcation. There is less than 50% stenosis of the proximal ICA due to calcified plaque. There is either short segment occlusion of the ECA at its origin with distal reconstitution versus a high-grade origin stenosis with obscuration of the residual patent lumen by the densely calcified plaque. Vertebral arteries: The vertebral arteries are patent with the left being minimally larger than the right. Focal calcified plaque near the right vertebral artery origin does not result in significant stenosis. Skeleton: Moderate multilevel cervical disc and facet degeneration are present. Prominent degenerative changes are also noted about the dens. Other neck: Mosaic attenuation of the visualized upper lobes is similar to the prior chest CT of 12/12/2014. A small amount of fluid is partially visualized in the superior aspect of the right major fissure. Enlargement of the main pulmonary artery is suggestive of pulmonary arterial hypertension. Multiple thyroid nodules are again seen, including a 2.9 cm nodule on the right, similar to prior chest CT. CTA HEAD Anterior circulation: The internal carotid arteries are patent. There is mild-to-moderate  carotid siphon calcification bilaterally without evidence of significant stenosis. ACAs and MCAs are patent without evidence of major branch occlusion or significant proximal stenosis. No intracranial aneurysm is identified. Posterior circulation: The intracranial vertebral arteries are widely patent to the basilar. PICA, AICA, and SCA origins are patent. Basilar artery is patent without stenosis. There are posterior communicating arteries bilaterally. PCAs are patent without evidence of significant stenosis. Venous sinuses: Hypoplastic left transverse sinus. No evidence of thrombosis. Anatomic variants: None of significance. Delayed phase: No abnormal enhancement. 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 No evidence of acute large territory infarct, intracranial hemorrhage, mass, midline shift, or extra-axial fluid collection. IMPRESSION: 1. No evidence of large vessel occlusion or significant intracranial arterial stenosis. 2. 65% stenosis of the distal left common carotid artery. 3. High-grade stenosis versus short segment occlusion of the left external carotid artery at its origin. Electronically Signed   By: Sebastian Ache M.D.   On: 06/18/2015 19:57   Ct Head Wo Contrast  06/18/2015  CLINICAL DATA:  Right arm numbness, weakness starting at 2 p.m. today. EXAM: CT HEAD WITHOUT CONTRAST TECHNIQUE: Contiguous axial images were obtained from the base of the skull through the vertex without intravenous contrast. COMPARISON:  None. FINDINGS: Old left occipital and posterior parietal infarct with encephalomalacia. Diffuse cerebral atrophy. Old right parietal infarct. No acute infarct. No hemorrhage or hydrocephalus. No acute calvarial abnormality. IMPRESSION: Old left posterior parietal/ occipital infarct and old right parietal infarct. No acute intracranial abnormality. Critical Value/emergent results were called by telephone at the time of interpretation on 06/18/2015 at 5:53 pm to Dr. Phineas Semen ,  who verbally acknowledged these results. Electronically Signed   By: Charlett Nose M.D.   On: 06/18/2015 17:55  Ct Angio Neck W/cm &/or Wo/cm  06/18/2015  CLINICAL DATA:  Right arm numbness and weakness. EXAM: CT ANGIOGRAPHY HEAD AND NECK TECHNIQUE: Multidetector CT imaging of the head and neck was performed using the standard protocol during bolus administration of intravenous contrast. Multiplanar CT image reconstructions and MIPs were obtained to evaluate the vascular anatomy. Carotid stenosis measurements (when applicable) are obtained utilizing NASCET criteria, using the distal internal carotid diameter as the denominator. CONTRAST:  OMNIPAQUE IOHEXOL 350 MG/ML SOLN COMPARISON:  Noncontrast head CT earlier today FINDINGS: CTA NECK Aortic arch: 3 vessel aortic arch with moderate calcified plaque. Brachiocephalic and subclavian arteries are patent. Calcified plaque in the proximal left subclavian artery results in less than 50% stenosis. Right carotid system: Common carotid artery is widely patent. There is mild non stenotic plaque in the proximal ICA. There is mild stenosis of the ECA origin due to calcified plaque. Left carotid system: Calcified plaque about the carotid bifurcation results in approximately 65% stenosis of the distal left common carotid artery just proximal to the bifurcation. There is less than 50% stenosis of the proximal ICA due to calcified plaque. There is either short segment occlusion of the ECA at its origin with distal reconstitution versus a high-grade origin stenosis with obscuration of the residual patent lumen by the densely calcified plaque. Vertebral arteries: The vertebral arteries are patent with the left being minimally larger than the right. Focal calcified plaque near the right vertebral artery origin does not result in significant stenosis. Skeleton: Moderate multilevel cervical disc and facet degeneration are present. Prominent degenerative changes are also noted  about the dens. Other neck: Mosaic attenuation of the visualized upper lobes is similar to the prior chest CT of 12/12/2014. A small amount of fluid is partially visualized in the superior aspect of the right major fissure. Enlargement of the main pulmonary artery is suggestive of pulmonary arterial hypertension. Multiple thyroid nodules are again seen, including a 2.9 cm nodule on the right, similar to prior chest CT. CTA HEAD Anterior circulation: The internal carotid arteries are patent. There is mild-to-moderate carotid siphon calcification bilaterally without evidence of significant stenosis. ACAs and MCAs are patent without evidence of major branch occlusion or significant proximal stenosis. No intracranial aneurysm is identified. Posterior circulation: The intracranial vertebral arteries are widely patent to the basilar. PICA, AICA, and SCA origins are patent. Basilar artery is patent without stenosis. There are posterior communicating arteries bilaterally. PCAs are patent without evidence of significant stenosis. Venous sinuses: Hypoplastic left transverse sinus. No evidence of thrombosis. Anatomic variants: None of significance. Delayed phase: No abnormal enhancement. 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 No evidence of acute large territory infarct, intracranial hemorrhage, mass, midline shift, or extra-axial fluid collection. IMPRESSION: 1. No evidence of large vessel occlusion or significant intracranial arterial stenosis. 2. 65% stenosis of the distal left common carotid artery. 3. High-grade stenosis versus short segment occlusion of the left external carotid artery at its origin. Electronically Signed   By: Sebastian Ache M.D.   On: 06/18/2015 19:57   Dg Foot Complete Left  05/22/2015  CLINICAL DATA:  Ulceration involving the fifth digit.  Bruising. EXAM: LEFT FOOT - COMPLETE 3+ VIEW COMPARISON:  Left toe radiographs - 09/21/2011 FINDINGS: There is apparent osteolysis involving the  distal medial aspect of the proximal phalanx of the fifth digit. No subcutaneous emphysema or radiopaque foreign body. There is unchanged presumably iatrogenic absence involving the head of the fifth metatarsal. No acute fracture or dislocation. Unchanged moderate  varus angulation of the IP joint of the great toe. A peripheral erosion is again noted involving the base of the proximal phalanx of the great digit with associated overhanging sclerotic edge. Pes planus deformity is noted on the provided lateral radiograph. Moderate-sized plantar calcaneal spur. Enthesopathic change involving the Achilles tendon insertion site. Distal vascular calcifications. IMPRESSION: 1. Findings worrisome for osteomyelitis involving the distal aspect of the proximal phalanx of the fifth digit. 2. 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 3. Degenerative change of the foot including potential gouty involvement of the first MTP joint. Clinical correlation is advised Electronically Signed   By: Simonne Come M.D.   On: 05/22/2015 08:50    Assessment/Plan 1. Likely left hemispheric CVA with acute right upper extremity weakness: The CT angiogram of the neck is significant for a reported 65% stenosis of the common carotid on the left.  I have personally reviewed the scan and believe the stenosis to be fully 80% or more.  Either way given her acute CVA it should be treated.  I will await the MRI result and plan for treatment in 4-6 weeks.  Obtain echocardiogram, Neuro checks, Consult neurology Will give aspirin and check fasting lipid panel and patient will be on statin PT consult   2. Hypokalemia Replete potassium and check a.m. Labs  3. End stage renal disease on hemodialysis on Tuesday Thursday and Saturday. Consult nephrology.  AV access appears to be working  Follow the redness over the acces with serial exams  4. Diabetes mellitus Will provide sliding scale insulin and check  hemoglobin A1c Resume Levemir  5. Essential hypertension Resume all medications and titrate as needed We will allow permissive hypertension as we are ruling out TIA versus CVA at this time    Renford Dills, MD  06/19/2015 7:32 PM

## 2015-06-19 NOTE — Care Management Obs Status (Signed)
MEDICARE OBSERVATION STATUS NOTIFICATION   Patient Details  Name: [name] MRN: [id number] Date of Birth: 1933-12-21   Medicare Observation Status Notification Given:  Yes    Gwenette Greet, RN 06/19/2015, 10:27 AM

## 2015-06-19 NOTE — Progress Notes (Signed)

## 2015-06-19 NOTE — Progress Notes (Signed)
Speech Therapy Note: reviewed chart notes; consulted NSG and met w/ pt and Son in room. Pt was awake/alert and verbally conversive; actually talking on phone w/ family upon entering room post meeting w/ Neurologist as well. Pt denied any trouble swallowing liquids and food; ate well at her recent meal. NSG endorsed same stating pt did not have trouble swallowing pills/meal this AM. Pt also denied any communication deficits; no expressive or receptive deficits. Son denied any deficits. Pt communicated appropriately w/ SLP. Briefly discussed general aspiration precautions. Pt agreed verbally.  No further skilled ST services indicated at this time. NSG to reconsult if any change in pt's status while admitted. Pt agreed.

## 2015-06-19 NOTE — Evaluation (Signed)
Physical Therapy Evaluation Patient Details Name: [name] MRN: [id number] DOB: December 07, 1933 Today's Date: 06/19/2015   History of Present Illness  presented to ER secondary to R UE weakness/numbness after dialysis session previous date (reports significant hypontensive episode during dialysis); admitted for TIA vs. CVA work up.  CT significant for chronic infarcts (L post parietal/occipital lobe, R parietal), but no acute changes.  CTA reveals no large vessel occlusion, but significant high-grade stenosis L external carotid.  MRI pending.  Clinical Impression  Upon evaluation, patient alert and oriented to basic information; follows all commands and demonstrates fair insight/safety awareness.  Noted deficits in R UE > LE strength, ROM, coordination and sensory awareness which impair patient's ability to safety/independently complete bed mobility, unsupported sitting and standing balance, sit/stand and basic transfers.  As result, currently requiring consistent mod assist from therapist with all mobility attempts to prevent fall/LOB (for R lateral lean with all upright postures). Patient notably fatigued with minimal activity; unable to tolerate activity beyond bed/chair transfer at this time. Of note, patient with recent history of L 5th toe amputation; per patient/son, to wear post-op shoe to L LE with all mobility attempts. Would benefit from skilled PT to address above deficits and promote optimal return to PLOF; recommend transition to STR upon discharge from acute hospitalization.     Follow Up Recommendations SNF    Equipment Recommendations       Recommendations for Other Services       Precautions / Restrictions Precautions Precautions: Fall Precaution Comments: R AVF Restrictions Other Position/Activity Restrictions: history of recent L 5th toe amputation (approx 2 weeks prior); per patient/son, to wear post-op shoe with all WBing, encouraged for WBing through L heel as able       Mobility  Bed Mobility Overal bed mobility: Needs Assistance Bed Mobility: Supine to Sit     Supine to sit: Mod assist     General bed mobility comments: transition towards R  Transfers Overall transfer level: Needs assistance   Transfers: Sit to/from Stand;Stand Pivot Transfers Sit to Stand: Mod assist Stand pivot transfers: Mod assist       General transfer comment: assist for lift off, standing balance and task sequencing.  Heavy R lean with upright activities requiring constant assist from therapist to prevent LOB  Ambulation/Gait             General Gait Details: non-ambulatory at baseline  Stairs            Wheelchair Mobility    Modified Rankin (Stroke Patients Only)       Balance Overall balance assessment: Needs assistance Sitting-balance support: No upper extremity supported;Feet supported Sitting balance-Leahy Scale: Fair Sitting balance - Comments: lists R with fatigue, self-corrects 75% time   Standing balance support: No upper extremity supported Standing balance-Leahy Scale: Poor Standing balance comment: heavy R lateral lean requiring constant mod assist from therapist to prevent fall                             Pertinent Vitals/Pain Pain Assessment: Faces Faces Pain Scale: Hurts even more Pain Location: R shoulder Pain Descriptors / Indicators: Aching;Grimacing Pain Intervention(s): Limited activity within patient's tolerance;Monitored during session;Repositioned    Home Living Family/patient expects to be discharged to:: Private residence Living Arrangements: Children Available Help at Discharge: Family;Available 24 hours/day Type of Home: House Home Access: Ramped entrance     Home Layout: One level Home Equipment: Transport chair  Prior Function Level of Independence: Independent with assistive device(s)         Comments: Mod indep for SPT to/from seating surfaces (or transport chair);  utilizes transport chair as primary mobility for household distances (propels with bilat LEs).  Non-ambulatory at baseline.     Hand Dominance   Dominant Hand: Right    Extremity/Trunk Assessment   Upper Extremity Assessment:  (R UE grossly 2-/5 at shoulder, 3-/5 at elbow and 3+/5 gross grasp/release.  Decreased sensory awareness to light touch with positive extinction in fingers of R hand.  Delayed speed of activation, requiring visual input for task completion) RUE Deficits / Details: Active shoulder flexion 0-20o (a great deal of crepitis. elbow, forearm and wrist WFL, hand opens and closes 80% but unable to opose fingers. elbow forearm strength 3+/5 grip 11 lbs sensation diminished to light touch and temp., intact for sharp, unable to test sterognosis secondary to coordination deficits. 9 hole peg test unable.         Lower Extremity Assessment:  (R LE grossly 3+ to 4-/5 throughout.  Mild sensory deficit in R foot (ankle distally))         Communication   Communication: No difficulties  Cognition Arousal/Alertness: Awake/alert Behavior During Therapy: WFL for tasks assessed/performed Overall Cognitive Status: Within Functional Limits for tasks assessed                      General Comments General comments (skin integrity, edema, etc.): L foot with dressing over foot/ankle (recent L 5th toe amputation approx 2 weeks prior to admission)    Exercises Other Exercises Other Exercises: Unsupported sitting balance edge of bed, close sup/min assist. Very limited ability to maintain balance with any weight shift/movement excursion to R of midline. Other Exercises: Sit/stand from edge of bed, mod assist--patient very fearful due to impaired balance      Assessment/Plan    PT Assessment Patient needs continued PT services  PT Diagnosis Difficulty walking;Generalized weakness;Hemiplegia dominant side   PT Problem List Decreased strength;Decreased range of motion;Decreased  balance;Decreased mobility;Decreased coordination;Decreased knowledge of use of DME;Decreased activity tolerance;Decreased safety awareness;Decreased knowledge of precautions;Impaired sensation;Pain  PT Treatment Interventions DME instruction;Functional mobility training;Therapeutic activities;Therapeutic exercise;Balance training;Neuromuscular re-education;Patient/family education   PT Goals (Current goals can be found in the Care Plan section) Acute Rehab PT Goals Patient Stated Goal: "to get stronger and be able to do more by myself" PT Goal Formulation: With patient/family Time For Goal Achievement: 07/03/15 Potential to Achieve Goals: Good    Frequency 7X/week   Barriers to discharge Decreased caregiver support      Co-evaluation               End of Session Equipment Utilized During Treatment: Gait belt Activity Tolerance: Patient limited by fatigue Patient left: in chair;with call bell/phone within reach;with chair alarm set;with family/visitor present Nurse Communication: Mobility status    Functional Assessment Tool Used: clinical judgement Functional Limitation: Mobility: Walking and moving around Mobility: Walking and Moving Around Current Status [phone]): At least 40 percent but less than 60 percent impaired, limited or restricted Mobility: Walking and Moving Around Goal Status [phone]): At least 1 percent but less than 20 percent impaired, limited or restricted    Time: 1135-1158 PT Time Calculation (min) (ACUTE ONLY): 23 min   Charges:   PT Evaluation $PT Eval Moderate Complexity: 1 Procedure PT Treatments $Neuromuscular Re-education: 8-22 mins   PT G Codes:   PT G-Codes **NOT FOR INPATIENT CLASS** Functional  Assessment Tool Used: clinical judgement Functional Limitation: Mobility: Walking and moving around Mobility: Walking and Moving Around Current Status [phone]): At least 40 percent but less than 60 percent impaired, limited or restricted Mobility:  Walking and Moving Around Goal Status [phone]): At least 1 percent but less than 20 percent impaired, limited or restricted    Davi Rotan H. Manson Passey, PT, DPT, NCS 06/19/2015, 1:31 PM [phone]

## 2015-06-19 NOTE — Progress Notes (Signed)
[facility]  ROUNDING NOTE   Subjective:  Patient well-known to [place] as we follow her for outpatient dialysis. Had hemodialysis yesterday. After dialysis she developed right upper extremity weakness. Patient's son reports that blood pressure was low during the treatment. He reports a blood pressure was as low as 50/30.   Objective:  Vital signs in last 24 hours:  Temp:  [97.8 F (36.6 C)-98.7 F (37.1 C)] 98.5 F (36.9 C) (01/18 1127) Pulse Rate:  [89-109] 93 (01/18 1127) Resp:  [14-28] 20 (01/18 1127) BP: (115-164)/(47-73) 125/60 mmHg (01/18 1127) SpO2:  [95 %-100 %] 95 % (01/18 1127) Weight:  [72.576 kg (160 lb)-73.619 kg (162 lb 4.8 oz)] 73.619 kg (162 lb 4.8 oz) (01/17 2332)  Weight change:  Filed Weights   06/18/15 1731 06/18/15 2332  Weight: 72.576 kg (160 lb) 73.619 kg (162 lb 4.8 oz)    Intake/Output:     Intake/Output this shift:  Total I/O In: 240 [P.O.:240] Out: -   Physical Exam: General: NAD, resting in bed  Head: Normocephalic, atraumatic. Moist oral mucosal membranes  Eyes: Anicteric  Neck: Supple, trachea midline  Lungs:  Clear to auscultation normal effort  Heart: irregular  Abdomen:  Soft, nontender, BS present  Extremities:  no peripheral edema.  Neurologic: RUE weakness noted.   Skin: No lesions  Access: RUE AVF    Basic Metabolic Panel:  Recent Labs Lab 06/18/15 1804  NA 135  K 3.2*  CL 97*  CO2 25  GLUCOSE 197*  BUN 26*  CREATININE 3.15*  CALCIUM 8.2*    Liver Function Tests:  Recent Labs Lab 06/18/15 1804  AST 20  ALT 8*  ALKPHOS 70  BILITOT 0.6  PROT 8.1  ALBUMIN 3.3*   No results for input(s): LIPASE, AMYLASE in the last 168 hours. No results for input(s): AMMONIA in the last 168 hours.  CBC:  Recent Labs Lab 06/18/15 1804  WBC 8.1  NEUTROABS 6.4  HGB 11.6*  HCT 34.7*  MCV 96.1  PLT 269    Cardiac Enzymes: No results for input(s): CKTOTAL, CKMB, CKMBINDEX, TROPONINI in the last 168  hours.  BNP: Invalid input(s): POCBNP  CBG:  Recent Labs Lab 06/18/15 1803  GLUCAP 189*    Microbiology: Results for orders placed or performed during the hospital encounter of 06/07/15  Wound culture     Status: None   Collection Time: 06/07/15  1:44 PM  Result Value Ref Range Status   Specimen Description ORTHO B9  Final   Special Requests NONE  Final   Gram Stain RARE WBC SEEN NO ORGANISMS SEEN   Final   Culture   Final    LIGHT GROWTH PROTEUS MIRABILIS LIGHT GROWTH STENOTROPHOMONAS MALTOPHILIA    Report Status 06/14/2015 FINAL  Final   Organism ID, Bacteria PROTEUS MIRABILIS  Final   Organism ID, Bacteria STENOTROPHOMONAS MALTOPHILIA  Final      Susceptibility   Proteus mirabilis - MIC*    AMPICILLIN >=32 RESISTANT Resistant     CEFTAZIDIME <=1 SENSITIVE Sensitive     CEFAZOLIN <=4 SENSITIVE Sensitive     CEFTRIAXONE <=1 SENSITIVE Sensitive     CIPROFLOXACIN >=4 RESISTANT Resistant     GENTAMICIN <=1 SENSITIVE Sensitive     IMIPENEM 2 SENSITIVE Sensitive     TRIMETH/SULFA >=320 RESISTANT Resistant     PIP/TAZO Value in next row Sensitive      SENSITIVE<=4    ERTAPENEM Value in next row Sensitive      SENSITIVE<=0.5    *  LIGHT GROWTH PROTEUS MIRABILIS   Stenotrophomonas maltophilia - MIC*    LEVOFLOXACIN Value in next row Sensitive      SENSITIVE<=0.5    TRIMETH/SULFA Value in next row Sensitive      SENSITIVE<=0.5    * LIGHT GROWTH STENOTROPHOMONAS MALTOPHILIA    Coagulation Studies:  Recent Labs  06/18/15 1804  LABPROT 15.4*  INR 1.20    Urinalysis: No results for input(s): COLORURINE, LABSPEC, PHURINE, GLUCOSEU, HGBUR, BILIRUBINUR, KETONESUR, PROTEINUR, UROBILINOGEN, NITRITE, LEUKOCYTESUR in the last 72 hours.  Invalid input(s): APPERANCEUR    Imaging: Ct Angio Head W/cm &/or Wo Cm  06/18/2015  CLINICAL DATA:  Right arm numbness and weakness. EXAM: CT ANGIOGRAPHY HEAD AND NECK TECHNIQUE: Multidetector CT imaging of the head and neck was  performed using the standard protocol during bolus administration of intravenous contrast. Multiplanar CT image reconstructions and MIPs were obtained to evaluate the vascular anatomy. Carotid stenosis measurements (when applicable) are obtained utilizing NASCET criteria, using the distal internal carotid diameter as the denominator. CONTRAST:  OMNIPAQUE IOHEXOL 350 MG/ML SOLN COMPARISON:  Noncontrast head CT earlier today FINDINGS: CTA NECK Aortic arch: 3 vessel aortic arch with moderate calcified plaque. Brachiocephalic and subclavian arteries are patent. Calcified plaque in the proximal left subclavian artery results in less than 50% stenosis. Right carotid system: Common carotid artery is widely patent. There is mild non stenotic plaque in the proximal ICA. There is mild stenosis of the ECA origin due to calcified plaque. Left carotid system: Calcified plaque about the carotid bifurcation results in approximately 65% stenosis of the distal left common carotid artery just proximal to the bifurcation. There is less than 50% stenosis of the proximal ICA due to calcified plaque. There is either short segment occlusion of the ECA at its origin with distal reconstitution versus a high-grade origin stenosis with obscuration of the residual patent lumen by the densely calcified plaque. Vertebral arteries: The vertebral arteries are patent with the left being minimally larger than the right. Focal calcified plaque near the right vertebral artery origin does not result in significant stenosis. Skeleton: Moderate multilevel cervical disc and facet degeneration are present. Prominent degenerative changes are also noted about the dens. Other neck: Mosaic attenuation of the visualized upper lobes is similar to the prior chest CT of 12/12/2014. A small amount of fluid is partially visualized in the superior aspect of the right major fissure. Enlargement of the main pulmonary artery is suggestive of pulmonary arterial  hypertension. Multiple thyroid nodules are again seen, including a 2.9 cm nodule on the right, similar to prior chest CT. CTA HEAD Anterior circulation: The internal carotid arteries are patent. There is mild-to-moderate carotid siphon calcification bilaterally without evidence of significant stenosis. ACAs and MCAs are patent without evidence of major branch occlusion or significant proximal stenosis. No intracranial aneurysm is identified. Posterior circulation: The intracranial vertebral arteries are widely patent to the basilar. PICA, AICA, and SCA origins are patent. Basilar artery is patent without stenosis. There are posterior communicating arteries bilaterally. PCAs are patent without evidence of significant stenosis. Venous sinuses: Hypoplastic left transverse sinus. No evidence of thrombosis. Anatomic variants: None of significance. Delayed phase: No abnormal enhancement. 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 No evidence of acute large territory infarct, intracranial hemorrhage, mass, midline shift, or extra-axial fluid collection. IMPRESSION: 1. No evidence of large vessel occlusion or significant intracranial arterial stenosis. 2. 65% stenosis of the distal left common carotid artery. 3. High-grade stenosis versus short segment occlusion of the left  external carotid artery at its origin. Electronically Signed   By: Sebastian Ache M.D.   On: 06/18/2015 19:57   Ct Head Wo Contrast  06/18/2015  CLINICAL DATA:  Right arm numbness, weakness starting at 2 p.m. today. EXAM: CT HEAD WITHOUT CONTRAST TECHNIQUE: Contiguous axial images were obtained from the base of the skull through the vertex without intravenous contrast. COMPARISON:  None. FINDINGS: Old left occipital and posterior parietal infarct with encephalomalacia. Diffuse cerebral atrophy. Old right parietal infarct. No acute infarct. No hemorrhage or hydrocephalus. No acute calvarial abnormality. IMPRESSION: Old left posterior parietal/  occipital infarct and old right parietal infarct. No acute intracranial abnormality. Critical Value/emergent results were called by telephone at the time of interpretation on 06/18/2015 at 5:53 pm to Dr. Phineas Semen , who verbally acknowledged these results. Electronically Signed   By: Charlett Nose M.D.   On: 06/18/2015 17:55   Ct Angio Neck W/cm &/or Wo/cm  06/18/2015  CLINICAL DATA:  Right arm numbness and weakness. EXAM: CT ANGIOGRAPHY HEAD AND NECK TECHNIQUE: Multidetector CT imaging of the head and neck was performed using the standard protocol during bolus administration of intravenous contrast. Multiplanar CT image reconstructions and MIPs were obtained to evaluate the vascular anatomy. Carotid stenosis measurements (when applicable) are obtained utilizing NASCET criteria, using the distal internal carotid diameter as the denominator. CONTRAST:  OMNIPAQUE IOHEXOL 350 MG/ML SOLN COMPARISON:  Noncontrast head CT earlier today FINDINGS: CTA NECK Aortic arch: 3 vessel aortic arch with moderate calcified plaque. Brachiocephalic and subclavian arteries are patent. Calcified plaque in the proximal left subclavian artery results in less than 50% stenosis. Right carotid system: Common carotid artery is widely patent. There is mild non stenotic plaque in the proximal ICA. There is mild stenosis of the ECA origin due to calcified plaque. Left carotid system: Calcified plaque about the carotid bifurcation results in approximately 65% stenosis of the distal left common carotid artery just proximal to the bifurcation. There is less than 50% stenosis of the proximal ICA due to calcified plaque. There is either short segment occlusion of the ECA at its origin with distal reconstitution versus a high-grade origin stenosis with obscuration of the residual patent lumen by the densely calcified plaque. Vertebral arteries: The vertebral arteries are patent with the left being minimally larger than the right. Focal  calcified plaque near the right vertebral artery origin does not result in significant stenosis. Skeleton: Moderate multilevel cervical disc and facet degeneration are present. Prominent degenerative changes are also noted about the dens. Other neck: Mosaic attenuation of the visualized upper lobes is similar to the prior chest CT of 12/12/2014. A small amount of fluid is partially visualized in the superior aspect of the right major fissure. Enlargement of the main pulmonary artery is suggestive of pulmonary arterial hypertension. Multiple thyroid nodules are again seen, including a 2.9 cm nodule on the right, similar to prior chest CT. CTA HEAD Anterior circulation: The internal carotid arteries are patent. There is mild-to-moderate carotid siphon calcification bilaterally without evidence of significant stenosis. ACAs and MCAs are patent without evidence of major branch occlusion or significant proximal stenosis. No intracranial aneurysm is identified. Posterior circulation: The intracranial vertebral arteries are widely patent to the basilar. PICA, AICA, and SCA origins are patent. Basilar artery is patent without stenosis. There are posterior communicating arteries bilaterally. PCAs are patent without evidence of significant stenosis. Venous sinuses: Hypoplastic left transverse sinus. No evidence of thrombosis. Anatomic variants: None of significance. Delayed phase: No abnormal enhancement.  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 No evidence of acute large territory infarct, intracranial hemorrhage, mass, midline shift, or extra-axial fluid collection. IMPRESSION: 1. No evidence of large vessel occlusion or significant intracranial arterial stenosis. 2. 65% stenosis of the distal left common carotid artery. 3. High-grade stenosis versus short segment occlusion of the left external carotid artery at its origin. Electronically Signed   By: Sebastian Ache M.D.   On: 06/18/2015 19:57     Medications:      . aspirin  81 mg Oral Daily  . enoxaparin (LOVENOX) injection  30 mg Subcutaneous Q24H   traMADol  Assessment/ Plan:  82 y.o. female with past medical history of atrial fibrillation on Coumadin, hypertension, gout, insulin-dependent diabetes mellitus type 2, diabetic neuropathy, CVA with right visual field blindness, hyperlipidemia, major depressive disorder, overactive bladder with incontinence, generalized anxiety disorder, congestive heart failure diastolic, parathyroidectomy, and osteoarthritis admitted with RUE weakness.    1.  ESRD on HD TTHS:  No acuteindication for hemodialysis at present.  She had hemodialysis yesterday and had low blood pressure during the treatment.  If she still here tomorrow we will plan for dialysis with no ultrafiltration.  2.  Anemia chronic kidney disease.  Hemoglobin currently 11.6.  Hold off on administering Epogen at this time.  3.  Secondary hyperparathyroidism.  We will check intact PTH and phosphorus during the next dialysis treatment.  4.  Right upper extremity weakness with concern for CVA. The patient was noted as having low blood pressure during hemodialysis treatment which may have led to hypoperfusion.  MRI of the brain without contrast is currentlypending. She still has some mild weakness in the right upper extremity.  Further workup per neurology.   LOS:  Sayge Brienza 1/18/201712:50 PM

## 2015-06-19 NOTE — Care Management (Signed)
Admitted to this facility with the diagnosis of TIA. Lives with daughter, Rinaldo Cloud [phone]). Last was in Dr. Guill[place] office 2 weeks ago. [facility] in [place] in the past. [facility] 2 years ago for rehabilitation. Dialysis at [facility] on [address] x 2 years. Tuesday-Thursday-Saturday. Uses [employer] for transportation. Home Health thru [facility] 6 months ago. No Life Alert. Takes care of all basic activities of daily living herself, no driver's license. Slipped out of a chair to the floor about 3 months ago. Good appetite. No seizures. Family will transport. Gwenette Greet RN MSN CCM Care Management [phone]

## 2015-06-19 NOTE — Progress Notes (Signed)
[facility] - [place] at [facility]   PATIENT NAME: [name]    MR#:  [id number]  DATE OF BIRTH:  1934/02/01  SUBJECTIVE:  CHIEF COMPLAINT:   Chief Complaint  Patient presents with  . arm numbness    patient here due to right hand and arm numbness and weakness. Still continues to have significant weakness in the right hand. No headache, nausea, vomiting, chest pain, shortness of breath.  REVIEW OF SYSTEMS:    Review of Systems  Constitutional: Negative for fever and chills.  HENT: Negative for congestion and tinnitus.   Eyes: Negative for blurred vision and double vision.  Respiratory: Negative for cough, shortness of breath and wheezing.   Cardiovascular: Negative for chest pain, orthopnea and PND.  Gastrointestinal: Negative for nausea, vomiting, abdominal pain and diarrhea.  Genitourinary: Negative for dysuria and hematuria.  Neurological: Positive for sensory change and focal weakness (right arm and hand). Negative for dizziness.  All other systems reviewed and are negative.   Nutrition: Heart Healthy/Carb Modified Tolerating Diet: Yes Tolerating PT: Eval Noted.    DRUG ALLERGIES:   Allergies  Allergen Reactions  . Codeine Other (See Comments)    Reaction:  Confusion/Hallucinations   . Oxycodone Other (See Comments)    Reaction:  Confusion/Hallucinations   . Quinine Derivatives Rash    VITALS:  Blood pressure 127/53, pulse 90, temperature 98.9 F (37.2 C), temperature source Oral, resp. rate 20, height  (1.702 m), weight 73.619 kg (162 lb 4.8 oz), SpO2 96 %.  PHYSICAL EXAMINATION:   Physical Exam  GENERAL:  82 y.o.-year-old patient sitting up in chair in no acute distress.  EYES: Pupils equal, round, reactive to light and accommodation. No scleral icterus. Extraocular muscles intact.  HEENT: Head atraumatic, normocephalic. Oropharynx and nasopharynx clear.  NECK:  Supple, no jugular venous distention. No thyroid  enlargement, no tenderness.  LUNGS: Normal breath sounds bilaterally, no wheezing, rales, rhonchi. No use of accessory muscles of respiration.  CARDIOVASCULAR: S1, S2 normal. No murmurs, rubs, or gallops.  ABDOMEN: Soft, nontender, nondistended. Bowel sounds present. No organomegaly or mass.  EXTREMITIES: No cyanosis, clubbing or edema b/l.    NEUROLOGIC: Cranial nerves II through XII are intact. No focal Motor or sensory deficits b/l. Globally weak  PSYCHIATRIC: The patient is alert and oriented x 3. Good affect SKIN: No obvious rash, lesion, or ulcer.   Right upper extremity AV fistula with good bruit and thrill.   LABORATORY PANEL:   CBC  Recent Labs Lab 06/18/15 1804  WBC 8.1  HGB 11.6*  HCT 34.7*  PLT 269   ------------------------------------------------------------------------------------------------------------------  Chemistries   Recent Labs Lab 06/18/15 1804  NA 135  K 3.2*  CL 97*  CO2 25  GLUCOSE 197*  BUN 26*  CREATININE 3.15*  CALCIUM 8.2*  AST 20  ALT 8*  ALKPHOS 70  BILITOT 0.6   ------------------------------------------------------------------------------------------------------------------  Cardiac Enzymes No results for input(s): TROPONINI in the last 168 hours. ------------------------------------------------------------------------------------------------------------------  RADIOLOGY:  Ct Angio Head W/cm &/or Wo Cm  06/18/2015  CLINICAL DATA:  Right arm numbness and weakness. EXAM: CT ANGIOGRAPHY HEAD AND NECK TECHNIQUE: Multidetector CT imaging of the head and neck was performed using the standard protocol during bolus administration of intravenous contrast. Multiplanar CT image reconstructions and MIPs were obtained to evaluate the vascular anatomy. Carotid stenosis measurements (when applicable) are obtained utilizing NASCET criteria, using the distal internal carotid diameter as the denominator. CONTRAST:  OMNIPAQUE IOHEXOL 350  MG/ML SOLN  COMPARISON:  Noncontrast head CT earlier today FINDINGS: CTA NECK Aortic arch: 3 vessel aortic arch with moderate calcified plaque. Brachiocephalic and subclavian arteries are patent. Calcified plaque in the proximal left subclavian artery results in less than 50% stenosis. Right carotid system: Common carotid artery is widely patent. There is mild non stenotic plaque in the proximal ICA. There is mild stenosis of the ECA origin due to calcified plaque. Left carotid system: Calcified plaque about the carotid bifurcation results in approximately 65% stenosis of the distal left common carotid artery just proximal to the bifurcation. There is less than 50% stenosis of the proximal ICA due to calcified plaque. There is either short segment occlusion of the ECA at its origin with distal reconstitution versus a high-grade origin stenosis with obscuration of the residual patent lumen by the densely calcified plaque. Vertebral arteries: The vertebral arteries are patent with the left being minimally larger than the right. Focal calcified plaque near the right vertebral artery origin does not result in significant stenosis. Skeleton: Moderate multilevel cervical disc and facet degeneration are present. Prominent degenerative changes are also noted about the dens. Other neck: Mosaic attenuation of the visualized upper lobes is similar to the prior chest CT of 12/12/2014. A small amount of fluid is partially visualized in the superior aspect of the right major fissure. Enlargement of the main pulmonary artery is suggestive of pulmonary arterial hypertension. Multiple thyroid nodules are again seen, including a 2.9 cm nodule on the right, similar to prior chest CT. CTA HEAD Anterior circulation: The internal carotid arteries are patent. There is mild-to-moderate carotid siphon calcification bilaterally without evidence of significant stenosis. ACAs and MCAs are patent without evidence of major branch occlusion or  significant proximal stenosis. No intracranial aneurysm is identified. Posterior circulation: The intracranial vertebral arteries are widely patent to the basilar. PICA, AICA, and SCA origins are patent. Basilar artery is patent without stenosis. There are posterior communicating arteries bilaterally. PCAs are patent without evidence of significant stenosis. Venous sinuses: Hypoplastic left transverse sinus. No evidence of thrombosis. Anatomic variants: None of significance. Delayed phase: No abnormal enhancement. 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 No evidence of acute large territory infarct, intracranial hemorrhage, mass, midline shift, or extra-axial fluid collection. IMPRESSION: 1. No evidence of large vessel occlusion or significant intracranial arterial stenosis. 2. 65% stenosis of the distal left common carotid artery. 3. High-grade stenosis versus short segment occlusion of the left external carotid artery at its origin. Electronically Signed   By: Sebastian Ache M.D.   On: 06/18/2015 19:57   Ct Head Wo Contrast  06/18/2015  CLINICAL DATA:  Right arm numbness, weakness starting at 2 p.m. today. EXAM: CT HEAD WITHOUT CONTRAST TECHNIQUE: Contiguous axial images were obtained from the base of the skull through the vertex without intravenous contrast. COMPARISON:  None. FINDINGS: Old left occipital and posterior parietal infarct with encephalomalacia. Diffuse cerebral atrophy. Old right parietal infarct. No acute infarct. No hemorrhage or hydrocephalus. No acute calvarial abnormality. IMPRESSION: Old left posterior parietal/ occipital infarct and old right parietal infarct. No acute intracranial abnormality. Critical Value/emergent results were called by telephone at the time of interpretation on 06/18/2015 at 5:53 pm to Dr. Phineas Semen , who verbally acknowledged these results. Electronically Signed   By: Charlett Nose M.D.   On: 06/18/2015 17:55   Ct Angio Neck W/cm &/or  Wo/cm  06/18/2015  CLINICAL DATA:  Right arm numbness and weakness. EXAM: CT ANGIOGRAPHY HEAD AND NECK TECHNIQUE: Multidetector CT imaging of  the head and neck was performed using the standard protocol during bolus administration of intravenous contrast. Multiplanar CT image reconstructions and MIPs were obtained to evaluate the vascular anatomy. Carotid stenosis measurements (when applicable) are obtained utilizing NASCET criteria, using the distal internal carotid diameter as the denominator. CONTRAST:  OMNIPAQUE IOHEXOL 350 MG/ML SOLN COMPARISON:  Noncontrast head CT earlier today FINDINGS: CTA NECK Aortic arch: 3 vessel aortic arch with moderate calcified plaque. Brachiocephalic and subclavian arteries are patent. Calcified plaque in the proximal left subclavian artery results in less than 50% stenosis. Right carotid system: Common carotid artery is widely patent. There is mild non stenotic plaque in the proximal ICA. There is mild stenosis of the ECA origin due to calcified plaque. Left carotid system: Calcified plaque about the carotid bifurcation results in approximately 65% stenosis of the distal left common carotid artery just proximal to the bifurcation. There is less than 50% stenosis of the proximal ICA due to calcified plaque. There is either short segment occlusion of the ECA at its origin with distal reconstitution versus a high-grade origin stenosis with obscuration of the residual patent lumen by the densely calcified plaque. Vertebral arteries: The vertebral arteries are patent with the left being minimally larger than the right. Focal calcified plaque near the right vertebral artery origin does not result in significant stenosis. Skeleton: Moderate multilevel cervical disc and facet degeneration are present. Prominent degenerative changes are also noted about the dens. Other neck: Mosaic attenuation of the visualized upper lobes is similar to the prior chest CT of 12/12/2014. A small amount  of fluid is partially visualized in the superior aspect of the right major fissure. Enlargement of the main pulmonary artery is suggestive of pulmonary arterial hypertension. Multiple thyroid nodules are again seen, including a 2.9 cm nodule on the right, similar to prior chest CT. CTA HEAD Anterior circulation: The internal carotid arteries are patent. There is mild-to-moderate carotid siphon calcification bilaterally without evidence of significant stenosis. ACAs and MCAs are patent without evidence of major branch occlusion or significant proximal stenosis. No intracranial aneurysm is identified. Posterior circulation: The intracranial vertebral arteries are widely patent to the basilar. PICA, AICA, and SCA origins are patent. Basilar artery is patent without stenosis. There are posterior communicating arteries bilaterally. PCAs are patent without evidence of significant stenosis. Venous sinuses: Hypoplastic left transverse sinus. No evidence of thrombosis. Anatomic variants: None of significance. Delayed phase: No abnormal enhancement. 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 No evidence of acute large territory infarct, intracranial hemorrhage, mass, midline shift, or extra-axial fluid collection. IMPRESSION: 1. No evidence of large vessel occlusion or significant intracranial arterial stenosis. 2. 65% stenosis of the distal left common carotid artery. 3. High-grade stenosis versus short segment occlusion of the left external carotid artery at its origin. Electronically Signed   By: Sebastian Ache M.D.   On: 06/18/2015 19:57     ASSESSMENT AND PLAN:   82 year old female with past medical history of end-stage renal disease on hemodialysis, hypertension, pulmonary hypertension, history of previous CVA, osteoarthritis, diabetes type 2 that palpation, who presented to the hospital due to right arm and hand numbness and weakness.  #1 acute CVA/TIA-this to working diagnoses given patient's acute  nature of right arm and hand numbness and weakness. -Patient's CT head is negative for any acute pathology. Patient CTA of the head and neck shows no significant intracranial stenosis but 65% stenosis in the distal common carotid artery on the left. -Continue ASA, appreciate neurology consult. -Await  MRI of the brain. - PT/OT eval noted.   #2 ESRD on HD - cont. Care as per Nephro.  - dialysis on Tu, Thurs, Sat.   #3. DM type II w/out complication - cont. SSI and follow sugars.   #4. Diabetic neuropathy-continue gabapentin.  #5 history of chronic afibrillation-rate controlled. Continue Cardizem. - Eliquis on hold presently and will await MRI brain before reinitiating.   #6. Anxiety - cont. Paxil.   #7 GERD - cont. Pepcid.    All the records are reviewed and case discussed with Care Management/Social Workerr. Management plans discussed with the patient, family and they are in agreement.  CODE STATUS: DNR  DVT Prophylaxis: Lovenox  TOTAL TIME TAKING CARE OF THIS PATIENT: 30 minutes.   POSSIBLE D/C IN 1-2 DAYS, DEPENDING ON CLINICAL CONDITION.   Houston Siren M.D on 06/19/2015 at 2:50 PM  Between 7am to 6pm - Pager - [phone]  After 6pm go to [email or web address] - password [email or web address] [facility]  [facility]  Office  [phone]  CC: Primary care physician; [name], MD

## 2015-06-19 NOTE — Progress Notes (Signed)
*  PRELIMINARY RESULTS* Echocardiogram 2D Echocardiogram has been performed.  [name] 06/19/2015, 2:29 PM

## 2015-06-19 NOTE — Progress Notes (Signed)
Occupational Therapy Evaluation Patient Details Name: [name] MRN: [id number] DOB: September 27, 1933 Today's Date: 06/19/2015    History of Present Illness This patient is a 82 year old female who came to [facility] with left sided weakness.   Clinical Impression   This patient is a 82 year old female with history of CVA, ESRD on dialysis who presents to the emergency department today because of concern for right arm numbness and weakness. The patient states that the symptoms started suddenly.  She lives with her daughter and son in law in a one story home with a ramp entrance. She had been independent with basic ADL. She now shows deficits in strength, range of motion, sensation and activities of daily living and would benefit from Occupational Therapy for ADL/functional mobility training and R UE restoration.    Follow Up Recommendations       Equipment Recommendations       Recommendations for Other Services       Precautions / Restrictions        Mobility Bed Mobility                  Transfers                      Balance                                            ADL                                         General ADL Comments: Patient had been independent with dressing, bathing and toileting. Patient will need moderate assist for dressing and bathing. Did not assist toilet transfer as she has a recent great toe amputation and unable to find weight bearing status.     Vision     Perception     Praxis      Pertinent Vitals/Pain       Hand Dominance     Extremity/Trunk Assessment Upper Extremity Assessment Upper Extremity Assessment: RUE deficits/detail (L UE WFL grip 15 lbs, 9 hole peg test 41 seconds.) RUE Deficits / Details: Active shoulder flexion 0-20o (a great deal of crepitis. elbow, forearm and wrist WFL, hand opens and closes 80% but unable to opose fingers. elbow forearm strength 3+/5 grip 11 lbs  sensation diminished to light touch and temp., intact for sharp, unable to test sterognosis secondary to coordination deficits. 9 hole peg test unable.   Lower Extremity Assessment Lower Extremity Assessment: Defer to PT evaluation       Communication Communication Communication: No difficulties   Cognition Arousal/Alertness: Awake/alert Behavior During Therapy: WFL for tasks assessed/performed                       General Comments   Upper extremity neuromuscular re-education. Active assistive R upper extremity  exercises shoulder flexion, external and internal rotation elbow flexion and extension, forearm supination and pronation, wrist flexion and extension, and hand flexion and extension 5 repetitions. Repositioned arm in elevation and explained to patient and son purpose of elevation to prevent edema.    Exercises       Shoulder Instructions      Home Living Family/patient expects to be discharged to::  Private residence Living Arrangements: Children Available Help at Discharge: Family   Home Access: [place]: One level     Bathroom Shower/Tub: Tub/shower unit                    Prior Functioning/Environment Level of Independence:  (Independent with basic ADL)             OT Diagnosis: Paresis   OT Problem List: Decreased strength;Decreased range of motion;Decreased activity tolerance;Impaired balance (sitting and/or standing);Decreased coordination;Decreased knowledge of use of DME or AE;Impaired sensation;Impaired UE functional use   OT Treatment/Interventions: Self-care/ADL training;Therapeutic exercise;Neuromuscular education;Manual therapy;Therapeutic activities    OT Goals(Current goals can be found in the care plan section) Acute Rehab OT Goals Patient Stated Goal: to get better OT Goal Formulation: With patient Time For Goal Achievement: 07/03/15 Potential to Achieve Goals: Good  OT Frequency: Min 1X/week    Barriers to D/C:            Co-evaluation              End of Session Equipment Utilized During Treatment:  (stroke test kit)  Activity Tolerance:   Patient left: in bed;with call bell/phone within reach;with family/visitor present (nursing turned off bed alarm)   Time: 1105-1130 OT Time Calculation (min): 25 min Charges:  OT General Charges $OT Visit: 1 Procedure OT Evaluation $OT Eval Low Complexity: 1 Procedure OT Treatments $Neuromuscular Re-education: 8-22 mins G-Codes: OT G-codes **NOT FOR INPATIENT CLASS** Functional Assessment Tool Used: clinical judgment Functional Limitation: Self care Self Care Current Status (K4818): At least 60 percent but less than 80 percent impaired, limited or restricted Self Care Discharge Status [phone]): At least 20 percent but less than 40 percent impaired, limited or restricted  Myrene Galas, MS/OTR/L  06/19/2015, 12:04 PM

## 2015-06-19 NOTE — Plan of Care (Signed)
Problem: Education: Goal: Knowledge of [facility] Education information/materials will improve Outcome: Progressing Pt oriented to unit and profile completed with help of daughter.     Problem: Education: Goal: Knowledge of disease or condition will improve Outcome: Progressing Reviewed stroke handbook, discussed what to expect in the am with testing.  Questions answered.

## 2015-06-20 ENCOUNTER — Observation Stay: Payer: Medicare Other

## 2015-06-20 DIAGNOSIS — R29898 Other symptoms and signs involving the musculoskeletal system: Secondary | ICD-10-CM | POA: Diagnosis not present

## 2015-06-20 LAB — GLUCOSE, CAPILLARY
Glucose-Capillary: 103 mg/dL — ABNORMAL HIGH (ref 65–99)
Glucose-Capillary: 114 mg/dL — ABNORMAL HIGH (ref 65–99)
Glucose-Capillary: 186 mg/dL — ABNORMAL HIGH (ref 65–99)
Glucose-Capillary: 185 mg/dL — ABNORMAL HIGH (ref 65–99)

## 2015-06-20 LAB — PHOSPHORUS: Phosphorus: 6.3 mg/dL — ABNORMAL HIGH (ref 2.5–4.6)

## 2015-06-20 MED ORDER — LIDOCAINE-PRILOCAINE 2.5-2.5 % EX CREA
1.0000 "application " | TOPICAL_CREAM | CUTANEOUS | Status: DC | PRN
  Filled 2015-06-20: qty 5

## 2015-06-20 MED ORDER — LIDOCAINE HCL (PF) 1 % IJ SOLN
5.0000 mL | INTRAMUSCULAR | Status: DC | PRN
  Filled 2015-06-20: qty 5

## 2015-06-20 MED ORDER — APIXABAN 2.5 MG PO TABS
2.5000 mg | ORAL_TABLET | Freq: Two times a day (BID) | ORAL | Status: DC
  Administered 2015-06-20 – 2015-06-21 (×3): 2.5 mg via ORAL
  Filled 2015-06-20 (×8): qty 1

## 2015-06-20 MED ORDER — ALTEPLASE 2 MG IJ SOLR
2.0000 mg | Freq: Once | INTRAMUSCULAR | Status: DC | PRN
  Filled 2015-06-20: qty 2

## 2015-06-20 MED ORDER — HEPARIN SODIUM (PORCINE) 1000 UNIT/ML DIALYSIS
1000.0000 [IU] | INTRAMUSCULAR | Status: DC | PRN
  Filled 2015-06-20: qty 1

## 2015-06-20 MED ORDER — DIPHENHYDRAMINE HCL 25 MG PO CAPS
25.0000 mg | ORAL_CAPSULE | Freq: Four times a day (QID) | ORAL | Status: DC | PRN
  Administered 2015-06-20 – 2015-06-24 (×2): 25 mg via ORAL
  Filled 2015-06-20 (×2): qty 1

## 2015-06-20 MED ORDER — SODIUM CHLORIDE 0.9 % IV SOLN: 100.0000 mL | INTRAVENOUS | Status: DC | PRN

## 2015-06-20 MED ORDER — ACETAMINOPHEN 325 MG PO TABS
650.0000 mg | ORAL_TABLET | Freq: Four times a day (QID) | ORAL | Status: DC | PRN
  Filled 2015-06-20: qty 2

## 2015-06-20 MED ORDER — DIPHENHYDRAMINE HCL 50 MG/ML IJ SOLN
12.5000 mg | Freq: Once | INTRAMUSCULAR | Status: AC
  Administered 2015-06-20: 12.5 mg via INTRAVENOUS

## 2015-06-20 MED ORDER — PENTAFLUOROPROP-TETRAFLUOROETH EX AERO
1.0000 "application " | INHALATION_SPRAY | CUTANEOUS | Status: DC | PRN
  Filled 2015-06-20: qty 30

## 2015-06-20 NOTE — Progress Notes (Signed)
[facility]  Daily Progress Note   Subjective  -   Patient states she is cold. She is very tired. She did not eat supper. She did tolerate dialysis without incident  Objective Filed Vitals:   06/20/15 1230 06/20/15 1245 06/20/15 1255 06/20/15 1335  BP: 117/60 122/62 122/51 119/52  Pulse: 101 101 103 98  Temp:   98.2 F (36.8 C) 98.6 F (37 C)  TempSrc:   Oral Oral  Resp: Height:      Weight:      SpO2:    100%    Intake/Output Summary (Last 24 hours) at 06/20/15 1950 Last data filed at 06/20/15 1900  Gross per 24 hour  Intake    120 ml  Output    500 ml  Net   -380 ml    PULM  Normal effort , no use of accessory muscles CV  No JVD, RRR Abd      No distended, nontender VASC  the right arm is less red over the AV graft it it remains nontender there is no evidence of induration is less warm this evening compared to yesterday. Neurologically the patient has some grip but little forearm and no biceps triceps function  Laboratory CBC    Component Value Date/Time   WBC 8.1 06/18/2015 1804   WBC 7.9 12/03/2013 0820   HGB 11.6* 06/18/2015 1804   HGB 14.2 12/03/2013 0820   HCT 34.7* 06/18/2015 1804   HCT 42.7 12/03/2013 0820   PLT 269 06/18/2015 1804   PLT 214 12/03/2013 0820    BMET    Component Value Date/Time   NA 135 06/18/2015 1804   NA 137 07/04/2014 1303   K 3.2* 06/18/2015 1804   K 3.3* 07/04/2014 1303   CL 97* 06/18/2015 1804   CL 100 07/04/2014 1303   CO2 25 06/18/2015 1804   CO2 29 07/04/2014 1303   GLUCOSE 197* 06/18/2015 1804   GLUCOSE 76 07/04/2014 1303   BUN 26* 06/18/2015 1804   BUN 44* 07/04/2014 1303   CREATININE 3.15* 06/18/2015 1804   CREATININE 2.72* 07/04/2014 1303   CALCIUM 8.2* 06/18/2015 1804   CALCIUM 8.9 07/04/2014 1303   GFRNONAA 13* 06/18/2015 1804   GFRNONAA 18* 07/04/2014 1303   GFRNONAA 29* 12/02/2013 0847   GFRAA 15* 06/18/2015 1804   GFRAA 22* 07/04/2014 1303   GFRAA 34* 12/02/2013  0847    Assessment/Planning:   1.  Critical stenosis of the left common carotid in association with CVA. If we are successful in obtaining an MRI tomorrow and confirming the CVA in the left hemisphere that I will continue with plans for carotid stenting in 2-3 weeks. I discussed this with her out of also reviewed my findings regarding the CT scan with the medical service.  2.   AV dialysis access; end-stage renal disease: We will continue the present course regarding her AV graft. She tolerated dialysis well there is no hard evidence of any infection. No plans for further treatment or interventions regarding her right arm brachial axillary graft.  Schnier, [name]  06/20/2015, 7:50 PM

## 2015-06-20 NOTE — Progress Notes (Signed)
Hemodialysis start 

## 2015-06-20 NOTE — Progress Notes (Signed)
Benadryl 25 mg po every 6 hours PRN

## 2015-06-20 NOTE — Progress Notes (Signed)
Subjective: Patient s/p dialysis.  Continues to have RUE weakness superimposed on generalized weakness.  Unable to have MRI here due to bladder stimulator.    Objective: Current vital signs: BP 119/52 mmHg  Pulse 98  Temp(Src) 98.6 F (37 C) (Oral)  Resp 20  Ht  (1.702 m)  Wt 73.619 kg (162 lb 4.8 oz)  BMI 25.41 kg/m2  SpO2 100% Vital signs in last 24 hours: Temp:  [97.7 F (36.5 C)-98.6 F (37 C)] 98.6 F (37 C) (01/19 1335) Pulse Rate:  [74-103] 98 (01/19 1335) Resp:  [16-22] 20 (01/19 1335) BP: (116-137)/(46-85) 119/52 mmHg (01/19 1335) SpO2:  [92 %-100 %] 100 % (01/19 1335)  Intake/Output from previous day: 01/18 0701 - 01/19 0700 In: 720 [P.O.:720] Out: -  Intake/Output this shift: Total I/O In: -  Out: 500 [Other:500] Nutritional status: Diet heart healthy/carb modified Room service appropriate?: Yes; Fluid consistency:: Thin  Neurologic Exam: Mental Status: Alert, oriented, thought content appropriate. Speech fluent without evidence of aphasia. Able to follow 3 step commands without difficulty. Cranial Nerves: II: Discs flat bilaterally; Visual fields grossly normal, left pupil 3mm, right pupil 2mm. Neither reactive.  III,IV, VI: ptosis not present, extra-ocular motions intact bilaterally V,VII: decrease in right NLF, facial light touch sensation normal bilaterally VIII: hearing normal bilaterally IX,X: gag reflex present XI: bilateral shoulder shrug XII: midline tongue extension Motor: Right :Upper extremity 3-/5Left: Upper extremity 5/5 Lower extremity 5/5Lower extremity 5/5 Tone and bulk:normal tone throughout; no atrophy noted Sensory: Pinprick and light touch decreased in the RUE  Lab Results: Basic Metabolic Panel:  Recent Labs Lab 06/18/15 1804 06/20/15 1016  NA 135  --   K 3.2*  --   CL 97*  --   CO2 25  --   GLUCOSE  197*  --   BUN 26*  --   CREATININE 3.15*  --   CALCIUM 8.2*  --   PHOS  --  6.3*    Liver Function Tests:  Recent Labs Lab 06/18/15 1804  AST 20  ALT 8*  ALKPHOS 70  BILITOT 0.6  PROT 8.1  ALBUMIN 3.3*   No results for input(s): LIPASE, AMYLASE in the last 168 hours. No results for input(s): AMMONIA in the last 168 hours.  CBC:  Recent Labs Lab 06/18/15 1804  WBC 8.1  NEUTROABS 6.4  HGB 11.6*  HCT 34.7*  MCV 96.1  PLT 269    Cardiac Enzymes: No results for input(s): CKTOTAL, CKMB, CKMBINDEX, TROPONINI in the last 168 hours.  Lipid Panel:  Recent Labs Lab 06/19/15 0616  CHOL 125  TRIG 144  HDL 35*  CHOLHDL 3.6  VLDL 29  LDLCALC 61    CBG:  Recent Labs Lab 06/18/15 1803 06/19/15 1611 06/19/15 2120 06/20/15 0751 06/20/15 1332  GLUCAP 189* 152* 120* 103* 114*    Microbiology: Results for orders placed or performed during the hospital encounter of 06/07/15  Wound culture     Status: None   Collection Time: 06/07/15  1:44 PM  Result Value Ref Range Status   Specimen Description ORTHO B9  Final   Special Requests NONE  Final   Gram Stain RARE WBC SEEN NO ORGANISMS SEEN   Final   Culture   Final    LIGHT GROWTH PROTEUS MIRABILIS LIGHT GROWTH STENOTROPHOMONAS MALTOPHILIA    Report Status 06/14/2015 FINAL  Final   Organism ID, Bacteria PROTEUS MIRABILIS  Final   Organism ID, Bacteria STENOTROPHOMONAS MALTOPHILIA  Final  Susceptibility   Proteus mirabilis - MIC*    AMPICILLIN >=32 RESISTANT Resistant     CEFTAZIDIME <=1 SENSITIVE Sensitive     CEFAZOLIN <=4 SENSITIVE Sensitive     CEFTRIAXONE <=1 SENSITIVE Sensitive     CIPROFLOXACIN >=4 RESISTANT Resistant     GENTAMICIN <=1 SENSITIVE Sensitive     IMIPENEM 2 SENSITIVE Sensitive     TRIMETH/SULFA >=320 RESISTANT Resistant     PIP/TAZO Value in next row Sensitive      SENSITIVE<=4    ERTAPENEM Value in next row Sensitive      SENSITIVE<=0.5    * LIGHT GROWTH PROTEUS MIRABILIS    Stenotrophomonas maltophilia - MIC*    LEVOFLOXACIN Value in next row Sensitive      SENSITIVE<=0.5    TRIMETH/SULFA Value in next row Sensitive      SENSITIVE<=0.5    * LIGHT GROWTH STENOTROPHOMONAS MALTOPHILIA    Coagulation Studies:  Recent Labs  06/18/15 1804  LABPROT 15.4*  INR 1.20    Imaging: Ct Angio Head W/cm &/or Wo Cm  06/18/2015  CLINICAL DATA:  Right arm numbness and weakness. EXAM: CT ANGIOGRAPHY HEAD AND NECK TECHNIQUE: Multidetector CT imaging of the head and neck was performed using the standard protocol during bolus administration of intravenous contrast. Multiplanar CT image reconstructions and MIPs were obtained to evaluate the vascular anatomy. Carotid stenosis measurements (when applicable) are obtained utilizing NASCET criteria, using the distal internal carotid diameter as the denominator. CONTRAST:  OMNIPAQUE IOHEXOL 350 MG/ML SOLN COMPARISON:  Noncontrast head CT earlier today FINDINGS: CTA NECK Aortic arch: 3 vessel aortic arch with moderate calcified plaque. Brachiocephalic and subclavian arteries are patent. Calcified plaque in the proximal left subclavian artery results in less than 50% stenosis. Right carotid system: Common carotid artery is widely patent. There is mild non stenotic plaque in the proximal ICA. There is mild stenosis of the ECA origin due to calcified plaque. Left carotid system: Calcified plaque about the carotid bifurcation results in approximately 65% stenosis of the distal left common carotid artery just proximal to the bifurcation. There is less than 50% stenosis of the proximal ICA due to calcified plaque. There is either short segment occlusion of the ECA at its origin with distal reconstitution versus a high-grade origin stenosis with obscuration of the residual patent lumen by the densely calcified plaque. Vertebral arteries: The vertebral arteries are patent with the left being minimally larger than the right. Focal calcified plaque  near the right vertebral artery origin does not result in significant stenosis. Skeleton: Moderate multilevel cervical disc and facet degeneration are present. Prominent degenerative changes are also noted about the dens. Other neck: Mosaic attenuation of the visualized upper lobes is similar to the prior chest CT of 12/12/2014. A small amount of fluid is partially visualized in the superior aspect of the right major fissure. Enlargement of the main pulmonary artery is suggestive of pulmonary arterial hypertension. Multiple thyroid nodules are again seen, including a 2.9 cm nodule on the right, similar to prior chest CT. CTA HEAD Anterior circulation: The internal carotid arteries are patent. There is mild-to-moderate carotid siphon calcification bilaterally without evidence of significant stenosis. ACAs and MCAs are patent without evidence of major branch occlusion or significant proximal stenosis. No intracranial aneurysm is identified. Posterior circulation: The intracranial vertebral arteries are widely patent to the basilar. PICA, AICA, and SCA origins are patent. Basilar artery is patent without stenosis. There are posterior communicating arteries bilaterally. PCAs are patent without evidence of significant  stenosis. Venous sinuses: Hypoplastic left transverse sinus. No evidence of thrombosis. Anatomic variants: None of significance. Delayed phase: No abnormal enhancement. 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 No evidence of acute large territory infarct, intracranial hemorrhage, mass, midline shift, or extra-axial fluid collection. IMPRESSION: 1. No evidence of large vessel occlusion or significant intracranial arterial stenosis. 2. 65% stenosis of the distal left common carotid artery. 3. High-grade stenosis versus short segment occlusion of the left external carotid artery at its origin. Electronically Signed   By: Sebastian Ache M.D.   On: 06/18/2015 19:57   Ct Head Wo Contrast  06/20/2015   CLINICAL DATA:  Right arm and hand numbness and weakness. EXAM: CT HEAD WITHOUT CONTRAST TECHNIQUE: Contiguous axial images were obtained from the base of the skull through the vertex without intravenous contrast. COMPARISON:  06/18/2015. FINDINGS: Stable old left occipital and right parietal infarcts. Stable old right frontal white matter lacunar infarct. Diffusely enlarged ventricles and subarachnoid spaces. Patchy white matter low density in both cerebral hemispheres. No intracranial hemorrhage, mass lesion or CT evidence of acute infarction. Sphenoid sinus retention cyst. Bilateral hyperostosis front talus. IMPRESSION: 1. No acute abnormality. 2. The stable atrophy, chronic small vessel white matter ischemic changes and old infarcts. Electronically Signed   By: Beckie Salts M.D.   On: 06/20/2015 14:37   Ct Head Wo Contrast  06/18/2015  CLINICAL DATA:  Right arm numbness, weakness starting at 2 p.m. today. EXAM: CT HEAD WITHOUT CONTRAST TECHNIQUE: Contiguous axial images were obtained from the base of the skull through the vertex without intravenous contrast. COMPARISON:  None. FINDINGS: Old left occipital and posterior parietal infarct with encephalomalacia. Diffuse cerebral atrophy. Old right parietal infarct. No acute infarct. No hemorrhage or hydrocephalus. No acute calvarial abnormality. IMPRESSION: Old left posterior parietal/ occipital infarct and old right parietal infarct. No acute intracranial abnormality. Critical Value/emergent results were called by telephone at the time of interpretation on 06/18/2015 at 5:53 pm to Dr. Phineas Semen , who verbally acknowledged these results. Electronically Signed   By: Charlett Nose M.D.   On: 06/18/2015 17:55   Ct Angio Neck W/cm &/or Wo/cm  06/18/2015  CLINICAL DATA:  Right arm numbness and weakness. EXAM: CT ANGIOGRAPHY HEAD AND NECK TECHNIQUE: Multidetector CT imaging of the head and neck was performed using the standard protocol during bolus  administration of intravenous contrast. Multiplanar CT image reconstructions and MIPs were obtained to evaluate the vascular anatomy. Carotid stenosis measurements (when applicable) are obtained utilizing NASCET criteria, using the distal internal carotid diameter as the denominator. CONTRAST:  OMNIPAQUE IOHEXOL 350 MG/ML SOLN COMPARISON:  Noncontrast head CT earlier today FINDINGS: CTA NECK Aortic arch: 3 vessel aortic arch with moderate calcified plaque. Brachiocephalic and subclavian arteries are patent. Calcified plaque in the proximal left subclavian artery results in less than 50% stenosis. Right carotid system: Common carotid artery is widely patent. There is mild non stenotic plaque in the proximal ICA. There is mild stenosis of the ECA origin due to calcified plaque. Left carotid system: Calcified plaque about the carotid bifurcation results in approximately 65% stenosis of the distal left common carotid artery just proximal to the bifurcation. There is less than 50% stenosis of the proximal ICA due to calcified plaque. There is either short segment occlusion of the ECA at its origin with distal reconstitution versus a high-grade origin stenosis with obscuration of the residual patent lumen by the densely calcified plaque. Vertebral arteries: The vertebral arteries are patent with the left being  minimally larger than the right. Focal calcified plaque near the right vertebral artery origin does not result in significant stenosis. Skeleton: Moderate multilevel cervical disc and facet degeneration are present. Prominent degenerative changes are also noted about the dens. Other neck: Mosaic attenuation of the visualized upper lobes is similar to the prior chest CT of 12/12/2014. A small amount of fluid is partially visualized in the superior aspect of the right major fissure. Enlargement of the main pulmonary artery is suggestive of pulmonary arterial hypertension. Multiple thyroid nodules are again seen,  including a 2.9 cm nodule on the right, similar to prior chest CT. CTA HEAD Anterior circulation: The internal carotid arteries are patent. There is mild-to-moderate carotid siphon calcification bilaterally without evidence of significant stenosis. ACAs and MCAs are patent without evidence of major branch occlusion or significant proximal stenosis. No intracranial aneurysm is identified. Posterior circulation: The intracranial vertebral arteries are widely patent to the basilar. PICA, AICA, and SCA origins are patent. Basilar artery is patent without stenosis. There are posterior communicating arteries bilaterally. PCAs are patent without evidence of significant stenosis. Venous sinuses: Hypoplastic left transverse sinus. No evidence of thrombosis. Anatomic variants: None of significance. Delayed phase: No abnormal enhancement. 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 No evidence of acute large territory infarct, intracranial hemorrhage, mass, midline shift, or extra-axial fluid collection. IMPRESSION: 1. No evidence of large vessel occlusion or significant intracranial arterial stenosis. 2. 65% stenosis of the distal left common carotid artery. 3. High-grade stenosis versus short segment occlusion of the left external carotid artery at its origin. Electronically Signed   By: Sebastian Ache M.D.   On: 06/18/2015 19:57    Medications:  I have reviewed the patient's current medications. Scheduled: . apixaban  2.5 mg Oral BID  . aspirin  81 mg Oral Daily  . calcium acetate (Phos Binder)  1,334 mg Oral TID WC  . diltiazem  120 mg Oral Daily  . famotidine  20 mg Oral Daily  . furosemide  80 mg Oral BID  . gabapentin  100 mg Oral Once per day on Sun Mon Wed Fri  . gabapentin  300 mg Oral Once per day on Tue Thu Sat  . insulin aspart  0-5 Units Subcutaneous QHS  . insulin aspart  0-9 Units Subcutaneous TID WC  . multivitamin  1 tablet Oral Daily  . PARoxetine  40 mg Oral QHS     Assessment/Plan: Weakness continues.  Not felt to be related to fistula per vascular.  Patient back on Eliquis.  Echocardiogram shows LVH with an EF of 50-55% with no source of emboli.  A1c 6.6, LDL 61.      Recommendations: 1. Due to patient being unable to have MRI here to be transferred to [facility] for [facility].  Will have MR imaging of the brain and cervical spine.  If no source of weakness noted on this imaging, patient to have fistula re-evaluated. 2. Continue Eliquis 3. Continue therapy    Thana Farr, MD Neurology [phone] 06/20/2015  3:05 PM

## 2015-06-20 NOTE — Progress Notes (Signed)
   06/20/15 0920  PT Visit Information  Last PT Received On 06/20/15  Assistance Needed +1  History of Present Illness presented to ER secondary to R UE weakness/numbness after dialysis session previous date (reports significant hypontensive episode during dialysis); admitted for TIA vs. CVA work up.  CT significant for chronic infarcts (L post parietal/occipital lobe, R parietal), but no acute changes.  CTA reveals no large vessel occlusion, but significant high-grade stenosis L external carotid.  MRI pending.  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 (approx 2 weeks prior); per patient/son, to wear post-op shoe with all WBing, encouraged for WBing through L heel as able  Pain Assessment  Pain Assessment 0-10  Pain Score 4  Cognition  Arousal/Alertness Awake/alert  Behavior During Therapy WFL for tasks assessed/performed  Overall Cognitive Status Within Functional Limits for tasks assessed  Bed Mobility  Overal bed mobility Needs Assistance  Bed Mobility Supine to Sit  Supine to sit Mod assist  General bed mobility comments reuired assit for trunk support d/t decreased strength of RUE  Transfers  General transfer comment NT due to arriival of transport to HD   Ambulation/Gait  General Gait Details non-ambulatory at baseline  Balance  Overall balance assessment Needs assistance  Sitting-balance support No upper extremity supported  Sitting balance-Leahy Scale Fair  Sitting balance - Comments able to maintain upright posture with min cues for maintain neutral position   Other Exercises  Other Exercises Performed seated  static balance activities, unsupported   PT - End of Session  Activity Tolerance Patient tolerated treatment well;Other (comment) (transport arrival for HD)  Patient left in bed;Other (comment) (Pt to be transported to HD )  PT - Assessment/Plan  PT Frequency (ACUTE ONLY) 7X/week  Follow Up Recommendations SNF  Acute Rehab PT Goals  PT Goal Formulation With patient  Time For Goal Achievement 07/03/15  Potential to Achieve Goals Good  PT General Charges  $$ ACUTE PT VISIT 1 Procedure  PT Treatments  $Neuromuscular Re-education 8-22 mins   Pt demonstrated improving static balance in sitting maintaining neutral position and reaching outside BOS without LOB or any significant leaning. Demonstrated decreased assist performed bed mobility supine to sit with minA for trunk support, pt required min A to return to supine.  Pt treatment session limited d/t to transport arriving to take to HD.   Tovia Kisner, PTA 1:31 PM

## 2015-06-20 NOTE — Progress Notes (Signed)
Hemodialysis completed. 

## 2015-06-20 NOTE — Progress Notes (Signed)
[facility]  ROUNDING NOTE   Subjective:  Pt seen during HD. Tolerating well.  Still with right arm numbness.     Objective:  Vital signs in last 24 hours:  Temp:  [97.7 F (36.5 C)-98.9 F (37.2 C)] 98.1 F (36.7 C) (01/19 0930) Pulse Rate:  [74-95] 87 (01/19 1130) Resp:  [16-21] 21 (01/19 1130) BP: (116-137)/(46-85) 135/59 mmHg (01/19 1130) SpO2:  [92 %-100 %] 92 % (01/19 0503)  Weight change:  Filed Weights   06/18/15 1731 06/18/15 2332  Weight: 72.576 kg (160 lb) 73.619 kg (162 lb 4.8 oz)    Intake/Output: I/O last 3 completed shifts: In: 720 [P.O.:720] Out: -    Intake/Output this shift:     Physical Exam: General: NAD, resting in bed  Head: Normocephalic, atraumatic. Moist oral mucosal membranes  Eyes: Anicteric  Neck: Supple, trachea midline  Lungs:  Clear to auscultation normal effort  Heart: irregular  Abdomen:  Soft, nontender, BS present  Extremities:  no peripheral edema.  Neurologic: RUE weakness noted.   Skin: No lesions  Access: RUE AVF    Basic Metabolic Panel:  Recent Labs Lab 06/18/15 1804 06/20/15 1016  NA 135  --   K 3.2*  --   CL 97*  --   CO2 25  --   GLUCOSE 197*  --   BUN 26*  --   CREATININE 3.15*  --   CALCIUM 8.2*  --   PHOS  --  6.3*    Liver Function Tests:  Recent Labs Lab 06/18/15 1804  AST 20  ALT 8*  ALKPHOS 70  BILITOT 0.6  PROT 8.1  ALBUMIN 3.3*   No results for input(s): LIPASE, AMYLASE in the last 168 hours. No results for input(s): AMMONIA in the last 168 hours.  CBC:  Recent Labs Lab 06/18/15 1804  WBC 8.1  NEUTROABS 6.4  HGB 11.6*  HCT 34.7*  MCV 96.1  PLT 269    Cardiac Enzymes: No results for input(s): CKTOTAL, CKMB, CKMBINDEX, TROPONINI in the last 168 hours.  BNP: Invalid input(s): POCBNP  CBG:  Recent Labs Lab 06/18/15 1803 06/19/15 1611 06/19/15 2120 06/20/15 0751  GLUCAP 189* 152* 120* 103*    Microbiology: Results for orders placed or performed  during the hospital encounter of 06/07/15  Wound culture     Status: None   Collection Time: 06/07/15  1:44 PM  Result Value Ref Range Status   Specimen Description ORTHO B9  Final   Special Requests NONE  Final   Gram Stain RARE WBC SEEN NO ORGANISMS SEEN   Final   Culture   Final    LIGHT GROWTH PROTEUS MIRABILIS LIGHT GROWTH STENOTROPHOMONAS MALTOPHILIA    Report Status 06/14/2015 FINAL  Final   Organism ID, Bacteria PROTEUS MIRABILIS  Final   Organism ID, Bacteria STENOTROPHOMONAS MALTOPHILIA  Final      Susceptibility   Proteus mirabilis - MIC*    AMPICILLIN >=32 RESISTANT Resistant     CEFTAZIDIME <=1 SENSITIVE Sensitive     CEFAZOLIN <=4 SENSITIVE Sensitive     CEFTRIAXONE <=1 SENSITIVE Sensitive     CIPROFLOXACIN >=4 RESISTANT Resistant     GENTAMICIN <=1 SENSITIVE Sensitive     IMIPENEM 2 SENSITIVE Sensitive     TRIMETH/SULFA >=320 RESISTANT Resistant     PIP/TAZO Value in next row Sensitive      SENSITIVE<=4    ERTAPENEM Value in next row Sensitive      SENSITIVE<=0.5    * LIGHT  GROWTH PROTEUS MIRABILIS   Stenotrophomonas maltophilia - MIC*    LEVOFLOXACIN Value in next row Sensitive      SENSITIVE<=0.5    TRIMETH/SULFA Value in next row Sensitive      SENSITIVE<=0.5    * LIGHT GROWTH STENOTROPHOMONAS MALTOPHILIA    Coagulation Studies:  Recent Labs  06/18/15 1804  LABPROT 15.4*  INR 1.20    Urinalysis: No results for input(s): COLORURINE, LABSPEC, PHURINE, GLUCOSEU, HGBUR, BILIRUBINUR, KETONESUR, PROTEINUR, UROBILINOGEN, NITRITE, LEUKOCYTESUR in the last 72 hours.  Invalid input(s): APPERANCEUR    Imaging: Ct Angio Head W/cm &/or Wo Cm  06/18/2015  CLINICAL DATA:  Right arm numbness and weakness. EXAM: CT ANGIOGRAPHY HEAD AND NECK TECHNIQUE: Multidetector CT imaging of the head and neck was performed using the standard protocol during bolus administration of intravenous contrast. Multiplanar CT image reconstructions and MIPs were obtained to evaluate  the vascular anatomy. Carotid stenosis measurements (when applicable) are obtained utilizing NASCET criteria, using the distal internal carotid diameter as the denominator. CONTRAST:  OMNIPAQUE IOHEXOL 350 MG/ML SOLN COMPARISON:  Noncontrast head CT earlier today FINDINGS: CTA NECK Aortic arch: 3 vessel aortic arch with moderate calcified plaque. Brachiocephalic and subclavian arteries are patent. Calcified plaque in the proximal left subclavian artery results in less than 50% stenosis. Right carotid system: Common carotid artery is widely patent. There is mild non stenotic plaque in the proximal ICA. There is mild stenosis of the ECA origin due to calcified plaque. Left carotid system: Calcified plaque about the carotid bifurcation results in approximately 65% stenosis of the distal left common carotid artery just proximal to the bifurcation. There is less than 50% stenosis of the proximal ICA due to calcified plaque. There is either short segment occlusion of the ECA at its origin with distal reconstitution versus a high-grade origin stenosis with obscuration of the residual patent lumen by the densely calcified plaque. Vertebral arteries: The vertebral arteries are patent with the left being minimally larger than the right. Focal calcified plaque near the right vertebral artery origin does not result in significant stenosis. Skeleton: Moderate multilevel cervical disc and facet degeneration are present. Prominent degenerative changes are also noted about the dens. Other neck: Mosaic attenuation of the visualized upper lobes is similar to the prior chest CT of 12/12/2014. A small amount of fluid is partially visualized in the superior aspect of the right major fissure. Enlargement of the main pulmonary artery is suggestive of pulmonary arterial hypertension. Multiple thyroid nodules are again seen, including a 2.9 cm nodule on the right, similar to prior chest CT. CTA HEAD Anterior circulation: The internal  carotid arteries are patent. There is mild-to-moderate carotid siphon calcification bilaterally without evidence of significant stenosis. ACAs and MCAs are patent without evidence of major branch occlusion or significant proximal stenosis. No intracranial aneurysm is identified. Posterior circulation: The intracranial vertebral arteries are widely patent to the basilar. PICA, AICA, and SCA origins are patent. Basilar artery is patent without stenosis. There are posterior communicating arteries bilaterally. PCAs are patent without evidence of significant stenosis. Venous sinuses: Hypoplastic left transverse sinus. No evidence of thrombosis. Anatomic variants: None of significance. Delayed phase: No abnormal enhancement. 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 No evidence of acute large territory infarct, intracranial hemorrhage, mass, midline shift, or extra-axial fluid collection. IMPRESSION: 1. No evidence of large vessel occlusion or significant intracranial arterial stenosis. 2. 65% stenosis of the distal left common carotid artery. 3. High-grade stenosis versus short segment occlusion of the left external  carotid artery at its origin. Electronically Signed   By: Sebastian Ache M.D.   On: 06/18/2015 19:57   Ct Head Wo Contrast  06/18/2015  CLINICAL DATA:  Right arm numbness, weakness starting at 2 p.m. today. EXAM: CT HEAD WITHOUT CONTRAST TECHNIQUE: Contiguous axial images were obtained from the base of the skull through the vertex without intravenous contrast. COMPARISON:  None. FINDINGS: Old left occipital and posterior parietal infarct with encephalomalacia. Diffuse cerebral atrophy. Old right parietal infarct. No acute infarct. No hemorrhage or hydrocephalus. No acute calvarial abnormality. IMPRESSION: Old left posterior parietal/ occipital infarct and old right parietal infarct. No acute intracranial abnormality. Critical Value/emergent results were called by telephone at the time of  interpretation on 06/18/2015 at 5:53 pm to Dr. Phineas Semen , who verbally acknowledged these results. Electronically Signed   By: Charlett Nose M.D.   On: 06/18/2015 17:55   Ct Angio Neck W/cm &/or Wo/cm  06/18/2015  CLINICAL DATA:  Right arm numbness and weakness. EXAM: CT ANGIOGRAPHY HEAD AND NECK TECHNIQUE: Multidetector CT imaging of the head and neck was performed using the standard protocol during bolus administration of intravenous contrast. Multiplanar CT image reconstructions and MIPs were obtained to evaluate the vascular anatomy. Carotid stenosis measurements (when applicable) are obtained utilizing NASCET criteria, using the distal internal carotid diameter as the denominator. CONTRAST:  OMNIPAQUE IOHEXOL 350 MG/ML SOLN COMPARISON:  Noncontrast head CT earlier today FINDINGS: CTA NECK Aortic arch: 3 vessel aortic arch with moderate calcified plaque. Brachiocephalic and subclavian arteries are patent. Calcified plaque in the proximal left subclavian artery results in less than 50% stenosis. Right carotid system: Common carotid artery is widely patent. There is mild non stenotic plaque in the proximal ICA. There is mild stenosis of the ECA origin due to calcified plaque. Left carotid system: Calcified plaque about the carotid bifurcation results in approximately 65% stenosis of the distal left common carotid artery just proximal to the bifurcation. There is less than 50% stenosis of the proximal ICA due to calcified plaque. There is either short segment occlusion of the ECA at its origin with distal reconstitution versus a high-grade origin stenosis with obscuration of the residual patent lumen by the densely calcified plaque. Vertebral arteries: The vertebral arteries are patent with the left being minimally larger than the right. Focal calcified plaque near the right vertebral artery origin does not result in significant stenosis. Skeleton: Moderate multilevel cervical disc and facet  degeneration are present. Prominent degenerative changes are also noted about the dens. Other neck: Mosaic attenuation of the visualized upper lobes is similar to the prior chest CT of 12/12/2014. A small amount of fluid is partially visualized in the superior aspect of the right major fissure. Enlargement of the main pulmonary artery is suggestive of pulmonary arterial hypertension. Multiple thyroid nodules are again seen, including a 2.9 cm nodule on the right, similar to prior chest CT. CTA HEAD Anterior circulation: The internal carotid arteries are patent. There is mild-to-moderate carotid siphon calcification bilaterally without evidence of significant stenosis. ACAs and MCAs are patent without evidence of major branch occlusion or significant proximal stenosis. No intracranial aneurysm is identified. Posterior circulation: The intracranial vertebral arteries are widely patent to the basilar. PICA, AICA, and SCA origins are patent. Basilar artery is patent without stenosis. There are posterior communicating arteries bilaterally. PCAs are patent without evidence of significant stenosis. Venous sinuses: Hypoplastic left transverse sinus. No evidence of thrombosis. Anatomic variants: None of significance. Delayed phase: No abnormal enhancement. Chronic  left occipital and right parietal infarcts. No evidence of acute large territory infarct, intracranial hemorrhage, mass, midline shift, or extra-axial fluid collection. IMPRESSION: 1. No evidence of large vessel occlusion or significant intracranial arterial stenosis. 2. 65% stenosis of the distal left common carotid artery. 3. High-grade stenosis versus short segment occlusion of the left external carotid artery at its origin. Electronically Signed   By: Sebastian Ache M.D.   On: 06/18/2015 19:57     Medications:     . aspirin  81 mg Oral Daily  . calcium acetate (Phos Binder)  1,334 mg Oral TID WC  . diltiazem  120 mg Oral Daily  . famotidine  20 mg Oral  Daily  . furosemide  80 mg Oral BID  . gabapentin  100 mg Oral Once per day on Sun Mon Wed Fri  . gabapentin  300 mg Oral Once per day on Tue Thu Sat  . heparin subcutaneous  5,000 Units Subcutaneous 3 times per day  . insulin aspart  0-5 Units Subcutaneous QHS  . insulin aspart  0-9 Units Subcutaneous TID WC  . multivitamin  1 tablet Oral Daily  . PARoxetine  40 mg Oral QHS   sodium chloride, sodium chloride, alteplase, heparin, lidocaine (PF), lidocaine-prilocaine, pentafluoroprop-tetrafluoroeth, traMADol  Assessment/ Plan:  82 y.o. female with past medical history of atrial fibrillation on Coumadin, hypertension, gout, insulin-dependent diabetes mellitus type 2, diabetic neuropathy, CVA with right visual field blindness, hyperlipidemia, major depressive disorder, overactive bladder with incontinence, generalized anxiety disorder, congestive heart failure diastolic, parathyroidectomy, and osteoarthritis admitted with RUE weakness.    1.  ESRD on HD TTHS:  Pt seen during HD today, tolerating well, limited UF today given hypotension during the last dialysis treatment.   2.  Anemia chronic kidney disease.  Continue to hold epogen given hgb of 11.6.   3.  Secondary hyperparathyroidism.  Phos high at 6.3, continue phoslo, may need to increase dose if phos continues to be high.   4.  Right upper extremity weakness with concern for CVA. MRI unable to be performed due to hardware.  Further work up per neurology.  Pt will need PT.    LOS:  Davyon Fisch 1/19/201711:34 AM

## 2015-06-20 NOTE — Plan of Care (Addendum)
VSS, afebrile. NIH 4- right upper extremity weakness. MRI pending.  Problem: Safety: Goal: Ability to remain free from injury will improve Outcome: Progressing High fall risk. Bed alarm on. Safe environment provided. Pt understands how to use call system for assistance.  Problem: Activity: Goal: Risk for activity intolerance will decrease Outcome: Progressing Pt needs assistance to turn completely on side due to right upper extremity weakness. Rest periods provided.

## 2015-06-20 NOTE — Progress Notes (Signed)
Post hd tx 

## 2015-06-20 NOTE — Progress Notes (Signed)
Pre-hd tx 

## 2015-06-20 NOTE — Progress Notes (Signed)
[facility] - [place] at [facility]   PATIENT NAME: [name]    MR#:  [id number]  DATE OF BIRTH:  11-Jun-1933  SUBJECTIVE:    patient here due to right hand and arm numbness and weakness. Still continues to have some weakness/numbness in the right hand. Seen at HD today and tolerating it well.   REVIEW OF SYSTEMS:    Review of Systems  Constitutional: Negative for fever and chills.  HENT: Negative for congestion and tinnitus.   Eyes: Negative for blurred vision and double vision.  Respiratory: Negative for cough, shortness of breath and wheezing.   Cardiovascular: Negative for chest pain, orthopnea and PND.  Gastrointestinal: Negative for nausea, vomiting, abdominal pain and diarrhea.  Genitourinary: Negative for dysuria and hematuria.  Neurological: Positive for sensory change and focal weakness (right arm and hand). Negative for dizziness.  All other systems reviewed and are negative.   Nutrition: Heart Healthy/Carb Modified Tolerating Diet: Yes Tolerating PT: Eval Noted.    DRUG ALLERGIES:   Allergies  Allergen Reactions  . Codeine Other (See Comments)    Reaction:  Confusion/Hallucinations   . Oxycodone Other (See Comments)    Reaction:  Confusion/Hallucinations   . Quinine Derivatives Rash    VITALS:  Blood pressure 119/52, pulse 98, temperature 98.6 F (37 C), temperature source Oral, resp. rate 20, height  (1.702 m), weight 73.619 kg (162 lb 4.8 oz), SpO2 100 %.  PHYSICAL EXAMINATION:   Physical Exam  GENERAL:  82 y.o.-year-old patient sitting up in chair in no acute distress.  EYES: Pupils equal, round, reactive to light and accommodation. No scleral icterus. Extraocular muscles intact.  HEENT: Head atraumatic, normocephalic. Oropharynx and nasopharynx clear.  NECK:  Supple, no jugular venous distention. No thyroid enlargement, no tenderness.  LUNGS: Normal breath sounds bilaterally, no wheezing, rales, rhonchi. No use of  accessory muscles of respiration.  CARDIOVASCULAR: S1, S2 normal. No murmurs, rubs, or gallops.  ABDOMEN: Soft, nontender, nondistended. Bowel sounds present. No organomegaly or mass.  EXTREMITIES: No cyanosis, clubbing or edema b/l.    NEUROLOGIC: Cranial nerves II through XII are intact. Right arm/hand weakness 3/5 strength. No other focal motor or sensory deficits appreciate b/l.  PSYCHIATRIC: The patient is alert and oriented x 3. Good affect SKIN: No obvious rash, lesion, or ulcer.   Right upper extremity AV fistula with good bruit and thrill.   LABORATORY PANEL:   CBC  Recent Labs Lab 06/18/15 1804  WBC 8.1  HGB 11.6*  HCT 34.7*  PLT 269   ------------------------------------------------------------------------------------------------------------------  Chemistries   Recent Labs Lab 06/18/15 1804  NA 135  K 3.2*  CL 97*  CO2 25  GLUCOSE 197*  BUN 26*  CREATININE 3.15*  CALCIUM 8.2*  AST 20  ALT 8*  ALKPHOS 70  BILITOT 0.6   ------------------------------------------------------------------------------------------------------------------  Cardiac Enzymes No results for input(s): TROPONINI in the last 168 hours. ------------------------------------------------------------------------------------------------------------------  RADIOLOGY:  Ct Angio Head W/cm &/or Wo Cm  06/18/2015  CLINICAL DATA:  Right arm numbness and weakness. EXAM: CT ANGIOGRAPHY HEAD AND NECK TECHNIQUE: Multidetector CT imaging of the head and neck was performed using the standard protocol during bolus administration of intravenous contrast. Multiplanar CT image reconstructions and MIPs were obtained to evaluate the vascular anatomy. Carotid stenosis measurements (when applicable) are obtained utilizing NASCET criteria, using the distal internal carotid diameter as the denominator. CONTRAST:  OMNIPAQUE IOHEXOL 350 MG/ML SOLN COMPARISON:  Noncontrast head CT earlier today FINDINGS: CTA  NECK  Aortic arch: 3 vessel aortic arch with moderate calcified plaque. Brachiocephalic and subclavian arteries are patent. Calcified plaque in the proximal left subclavian artery results in less than 50% stenosis. Right carotid system: Common carotid artery is widely patent. There is mild non stenotic plaque in the proximal ICA. There is mild stenosis of the ECA origin due to calcified plaque. Left carotid system: Calcified plaque about the carotid bifurcation results in approximately 65% stenosis of the distal left common carotid artery just proximal to the bifurcation. There is less than 50% stenosis of the proximal ICA due to calcified plaque. There is either short segment occlusion of the ECA at its origin with distal reconstitution versus a high-grade origin stenosis with obscuration of the residual patent lumen by the densely calcified plaque. Vertebral arteries: The vertebral arteries are patent with the left being minimally larger than the right. Focal calcified plaque near the right vertebral artery origin does not result in significant stenosis. Skeleton: Moderate multilevel cervical disc and facet degeneration are present. Prominent degenerative changes are also noted about the dens. Other neck: Mosaic attenuation of the visualized upper lobes is similar to the prior chest CT of 12/12/2014. A small amount of fluid is partially visualized in the superior aspect of the right major fissure. Enlargement of the main pulmonary artery is suggestive of pulmonary arterial hypertension. Multiple thyroid nodules are again seen, including a 2.9 cm nodule on the right, similar to prior chest CT. CTA HEAD Anterior circulation: The internal carotid arteries are patent. There is mild-to-moderate carotid siphon calcification bilaterally without evidence of significant stenosis. ACAs and MCAs are patent without evidence of major branch occlusion or significant proximal stenosis. No intracranial aneurysm is identified.  Posterior circulation: The intracranial vertebral arteries are widely patent to the basilar. PICA, AICA, and SCA origins are patent. Basilar artery is patent without stenosis. There are posterior communicating arteries bilaterally. PCAs are patent without evidence of significant stenosis. Venous sinuses: Hypoplastic left transverse sinus. No evidence of thrombosis. Anatomic variants: None of significance. Delayed phase: No abnormal enhancement. 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 No evidence of acute large territory infarct, intracranial hemorrhage, mass, midline shift, or extra-axial fluid collection. IMPRESSION: 1. No evidence of large vessel occlusion or significant intracranial arterial stenosis. 2. 65% stenosis of the distal left common carotid artery. 3. High-grade stenosis versus short segment occlusion of the left external carotid artery at its origin. Electronically Signed   By: Sebastian Ache M.D.   On: 06/18/2015 19:57   Ct Head Wo Contrast  06/20/2015  CLINICAL DATA:  Right arm and hand numbness and weakness. EXAM: CT HEAD WITHOUT CONTRAST TECHNIQUE: Contiguous axial images were obtained from the base of the skull through the vertex without intravenous contrast. COMPARISON:  06/18/2015. FINDINGS: Stable old left occipital and right parietal infarcts. Stable old right frontal white matter lacunar infarct. Diffusely enlarged ventricles and subarachnoid spaces. Patchy white matter low density in both cerebral hemispheres. No intracranial hemorrhage, mass lesion or CT evidence of acute infarction. Sphenoid sinus retention cyst. Bilateral hyperostosis front talus. IMPRESSION: 1. No acute abnormality. 2. The stable atrophy, chronic small vessel white matter ischemic changes and old infarcts. Electronically Signed   By: Beckie Salts M.D.   On: 06/20/2015 14:37   Ct Head Wo Contrast  06/18/2015  CLINICAL DATA:  Right arm numbness, weakness starting at 2 p.m. today. EXAM: CT HEAD WITHOUT  CONTRAST TECHNIQUE: Contiguous axial images were obtained from the base of the skull through the vertex without intravenous  contrast. COMPARISON:  None. FINDINGS: Old left occipital and posterior parietal infarct with encephalomalacia. Diffuse cerebral atrophy. Old right parietal infarct. No acute infarct. No hemorrhage or hydrocephalus. No acute calvarial abnormality. IMPRESSION: Old left posterior parietal/ occipital infarct and old right parietal infarct. No acute intracranial abnormality. Critical Value/emergent results were called by telephone at the time of interpretation on 06/18/2015 at 5:53 pm to Dr. Phineas Semen , who verbally acknowledged these results. Electronically Signed   By: Charlett Nose M.D.   On: 06/18/2015 17:55   Ct Angio Neck W/cm &/or Wo/cm  06/18/2015  CLINICAL DATA:  Right arm numbness and weakness. EXAM: CT ANGIOGRAPHY HEAD AND NECK TECHNIQUE: Multidetector CT imaging of the head and neck was performed using the standard protocol during bolus administration of intravenous contrast. Multiplanar CT image reconstructions and MIPs were obtained to evaluate the vascular anatomy. Carotid stenosis measurements (when applicable) are obtained utilizing NASCET criteria, using the distal internal carotid diameter as the denominator. CONTRAST:  OMNIPAQUE IOHEXOL 350 MG/ML SOLN COMPARISON:  Noncontrast head CT earlier today FINDINGS: CTA NECK Aortic arch: 3 vessel aortic arch with moderate calcified plaque. Brachiocephalic and subclavian arteries are patent. Calcified plaque in the proximal left subclavian artery results in less than 50% stenosis. Right carotid system: Common carotid artery is widely patent. There is mild non stenotic plaque in the proximal ICA. There is mild stenosis of the ECA origin due to calcified plaque. Left carotid system: Calcified plaque about the carotid bifurcation results in approximately 65% stenosis of the distal left common carotid artery just proximal to the  bifurcation. There is less than 50% stenosis of the proximal ICA due to calcified plaque. There is either short segment occlusion of the ECA at its origin with distal reconstitution versus a high-grade origin stenosis with obscuration of the residual patent lumen by the densely calcified plaque. Vertebral arteries: The vertebral arteries are patent with the left being minimally larger than the right. Focal calcified plaque near the right vertebral artery origin does not result in significant stenosis. Skeleton: Moderate multilevel cervical disc and facet degeneration are present. Prominent degenerative changes are also noted about the dens. Other neck: Mosaic attenuation of the visualized upper lobes is similar to the prior chest CT of 12/12/2014. A small amount of fluid is partially visualized in the superior aspect of the right major fissure. Enlargement of the main pulmonary artery is suggestive of pulmonary arterial hypertension. Multiple thyroid nodules are again seen, including a 2.9 cm nodule on the right, similar to prior chest CT. CTA HEAD Anterior circulation: The internal carotid arteries are patent. There is mild-to-moderate carotid siphon calcification bilaterally without evidence of significant stenosis. ACAs and MCAs are patent without evidence of major branch occlusion or significant proximal stenosis. No intracranial aneurysm is identified. Posterior circulation: The intracranial vertebral arteries are widely patent to the basilar. PICA, AICA, and SCA origins are patent. Basilar artery is patent without stenosis. There are posterior communicating arteries bilaterally. PCAs are patent without evidence of significant stenosis. Venous sinuses: Hypoplastic left transverse sinus. No evidence of thrombosis. Anatomic variants: None of significance. Delayed phase: No abnormal enhancement. 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 No evidence of acute large territory infarct, intracranial  hemorrhage, mass, midline shift, or extra-axial fluid collection. IMPRESSION: 1. No evidence of large vessel occlusion or significant intracranial arterial stenosis. 2. 65% stenosis of the distal left common carotid artery. 3. High-grade stenosis versus short segment occlusion of the left external carotid artery at its origin. Electronically  Signed   By: Sebastian Ache M.D.   On: 06/18/2015 19:57     ASSESSMENT AND PLAN:   82 year old female with past medical history of end-stage renal disease on hemodialysis, hypertension, pulmonary hypertension, history of previous CVA, osteoarthritis, diabetes type 2 that palpation, who presented to the hospital due to right arm and hand numbness and weakness.  #1 acute CVA/TIA-this is the working diagnoses given patient's acute nature of right arm and hand numbness and weakness. -Patient's CT head is negative for any acute pathology. Patient CTA of the head and neck shows no significant intracranial stenosis but 65% stenosis in the distal common carotid artery on the left. -Continue ASA, appreciate neurology consult. I repeated a CT head here today which is negative.   - PT/OT eval noted.  - Pt needs an MRI Brain which cannot be done here as she has a bladder stimulator and on MRI cannot accommodate. The MRI department here has faxed over to details in the bladder stimulator to the MRI department at [facility]. The MRI department at [facility] is going to look into whether she can have the MRI done there and will likely transfer her there to get the MRI done and bring her back to the hospital. I attempted to transfer the patient directly to [facility] but if she only needs the MRI then the above process sounds more logical. I will await to hear back from MRI at [facility] tomorrow.   #2 ESRD on HD - cont. Care as per Nephro.  - dialysis on Tu, Thurs, Sat. Pt had HD today and tolerated it well.   #3. DM type II w/out complication - cont. SSI - BS stable.    #4.  Diabetic neuropathy-continue gabapentin.  #5 history of chronic afibrillation-rate controlled. Continue Cardizem. - will resume Elquis.   #6. Anxiety - cont. Paxil.   #7 GERD - cont. Pepcid.    All the records are reviewed and case discussed with Care Management/Social Workerr. Management plans discussed with the patient, family and they are in agreement.  CODE STATUS: DNR  DVT Prophylaxis: Lovenox  TOTAL TIME TAKING CARE OF THIS PATIENT: 50 minutes.   POSSIBLE D/C IN 1-2 DAYS, DEPENDING ON CLINICAL CONDITION.  Greater than 50% of time spent in coordination of care and discussion with the patient's daughter over the phone, MRI department here and at [facility], also the neurologist.   Houston Siren M.D on 06/20/2015 at 3:55 PM  Between 7am to 6pm - Pager - [phone]  After 6pm go to [email or web address] - password [email or web address] [facility]  [facility]  Office  [phone]  CC: Primary care physician; [name], MD

## 2015-06-21 ENCOUNTER — Ambulatory Visit (HOSPITAL_COMMUNITY)
Admit: 2015-06-21 | Discharge: 2015-06-21 | Disposition: A | Discharge status: Home / Self Care | Payer: Medicare Other | Attending: Specialist | Admitting: Specialist

## 2015-06-21 DIAGNOSIS — Z794 Long term (current) use of insulin: Secondary | ICD-10-CM | POA: Diagnosis not present

## 2015-06-21 DIAGNOSIS — G8191 Hemiplegia, unspecified affecting right dominant side: Secondary | ICD-10-CM | POA: Diagnosis not present

## 2015-06-21 DIAGNOSIS — N3281 Overactive bladder: Secondary | ICD-10-CM | POA: Diagnosis not present

## 2015-06-21 DIAGNOSIS — G9389 Other specified disorders of brain: Secondary | ICD-10-CM | POA: Insufficient documentation

## 2015-06-21 DIAGNOSIS — M1990 Unspecified osteoarthritis, unspecified site: Secondary | ICD-10-CM | POA: Diagnosis not present

## 2015-06-21 DIAGNOSIS — E785 Hyperlipidemia, unspecified: Secondary | ICD-10-CM | POA: Diagnosis not present

## 2015-06-21 DIAGNOSIS — I639 Cerebral infarction, unspecified: Secondary | ICD-10-CM | POA: Insufficient documentation

## 2015-06-21 DIAGNOSIS — E876 Hypokalemia: Secondary | ICD-10-CM | POA: Diagnosis not present

## 2015-06-21 DIAGNOSIS — I132 Hypertensive heart and chronic kidney disease with heart failure and with stage 5 chronic kidney disease, or end stage renal disease: Secondary | ICD-10-CM | POA: Diagnosis not present

## 2015-06-21 DIAGNOSIS — E1122 Type 2 diabetes mellitus with diabetic chronic kidney disease: Secondary | ICD-10-CM | POA: Diagnosis not present

## 2015-06-21 DIAGNOSIS — Z66 Do not resuscitate: Secondary | ICD-10-CM | POA: Diagnosis not present

## 2015-06-21 DIAGNOSIS — Z7982 Long term (current) use of aspirin: Secondary | ICD-10-CM | POA: Diagnosis not present

## 2015-06-21 DIAGNOSIS — M109 Gout, unspecified: Secondary | ICD-10-CM | POA: Diagnosis not present

## 2015-06-21 DIAGNOSIS — I6340 Cerebral infarction due to embolism of unspecified cerebral artery: Secondary | ICD-10-CM | POA: Diagnosis not present

## 2015-06-21 DIAGNOSIS — Z885 Allergy status to narcotic agent status: Secondary | ICD-10-CM | POA: Diagnosis not present

## 2015-06-21 DIAGNOSIS — G319 Degenerative disease of nervous system, unspecified: Secondary | ICD-10-CM | POA: Insufficient documentation

## 2015-06-21 DIAGNOSIS — F411 Generalized anxiety disorder: Secondary | ICD-10-CM | POA: Diagnosis not present

## 2015-06-21 DIAGNOSIS — N2581 Secondary hyperparathyroidism of renal origin: Secondary | ICD-10-CM | POA: Diagnosis not present

## 2015-06-21 DIAGNOSIS — E1140 Type 2 diabetes mellitus with diabetic neuropathy, unspecified: Secondary | ICD-10-CM | POA: Diagnosis not present

## 2015-06-21 DIAGNOSIS — K219 Gastro-esophageal reflux disease without esophagitis: Secondary | ICD-10-CM | POA: Diagnosis not present

## 2015-06-21 DIAGNOSIS — Z89422 Acquired absence of other left toe(s): Secondary | ICD-10-CM | POA: Diagnosis not present

## 2015-06-21 DIAGNOSIS — Z96643 Presence of artificial hip joint, bilateral: Secondary | ICD-10-CM | POA: Diagnosis not present

## 2015-06-21 DIAGNOSIS — Z79899 Other long term (current) drug therapy: Secondary | ICD-10-CM | POA: Diagnosis not present

## 2015-06-21 DIAGNOSIS — H5441 Blindness, right eye, normal vision left eye: Secondary | ICD-10-CM | POA: Diagnosis not present

## 2015-06-21 DIAGNOSIS — F329 Major depressive disorder, single episode, unspecified: Secondary | ICD-10-CM | POA: Diagnosis not present

## 2015-06-21 DIAGNOSIS — Z992 Dependence on renal dialysis: Secondary | ICD-10-CM | POA: Diagnosis not present

## 2015-06-21 DIAGNOSIS — I5032 Chronic diastolic (congestive) heart failure: Secondary | ICD-10-CM | POA: Diagnosis not present

## 2015-06-21 DIAGNOSIS — I6319 Cerebral infarction due to embolism of other precerebral artery: Secondary | ICD-10-CM | POA: Diagnosis not present

## 2015-06-21 DIAGNOSIS — I272 Other secondary pulmonary hypertension: Secondary | ICD-10-CM | POA: Diagnosis not present

## 2015-06-21 DIAGNOSIS — N186 End stage renal disease: Secondary | ICD-10-CM | POA: Diagnosis not present

## 2015-06-21 DIAGNOSIS — I482 Chronic atrial fibrillation: Secondary | ICD-10-CM | POA: Diagnosis not present

## 2015-06-21 DIAGNOSIS — Z7901 Long term (current) use of anticoagulants: Secondary | ICD-10-CM | POA: Diagnosis not present

## 2015-06-21 DIAGNOSIS — D649 Anemia, unspecified: Secondary | ICD-10-CM | POA: Diagnosis not present

## 2015-06-21 LAB — CBC
HCT: 32.6 % — ABNORMAL LOW (ref 35.0–47.0)
Hemoglobin: 10.7 g/dL — ABNORMAL LOW (ref 12.0–16.0)
MCH: 31.1 pg (ref 26.0–34.0)
MCHC: 32.7 g/dL (ref 32.0–36.0)
MCV: 95 fL (ref 80.0–100.0)
PLATELETS: 249 10*3/uL (ref 150–440)
RBC: 3.43 MIL/uL — AB (ref 3.80–5.20)
RDW: 15.4 % — AB (ref 11.5–14.5)
WBC: 11.3 10*3/uL — ABNORMAL HIGH (ref 3.6–11.0)

## 2015-06-21 LAB — GLUCOSE, CAPILLARY
GLUCOSE-CAPILLARY: 128 mg/dL — AB (ref 65–99)
GLUCOSE-CAPILLARY: 221 mg/dL — AB (ref 65–99)
GLUCOSE-CAPILLARY: 82 mg/dL (ref 65–99)
GLUCOSE-CAPILLARY: 161 mg/dL — AB (ref 65–99)

## 2015-06-21 LAB — HEPATITIS B SURFACE ANTIGEN: Hepatitis B Surface Ag: NEGATIVE

## 2015-06-21 LAB — PARATHYROID HORMONE, INTACT (NO CA): PTH: 160 pg/mL — AB (ref 15–65)

## 2015-06-21 MED ORDER — ATORVASTATIN CALCIUM 20 MG PO TABS
40.0000 mg | ORAL_TABLET | Freq: Every day | ORAL | Status: DC
  Administered 2015-06-21 – 2015-06-24 (×4): 40 mg via ORAL
  Filled 2015-06-21 (×4): qty 2

## 2015-06-21 MED ORDER — DIAZEPAM 5 MG PO TABS
5.0000 mg | ORAL_TABLET | ORAL | Status: AC
  Administered 2015-06-21: 5 mg via ORAL
  Filled 2015-06-21: qty 1

## 2015-06-21 NOTE — Progress Notes (Signed)
[facility]  ROUNDING NOTE   Subjective:  Still has right arm weakness and numbness. Had HD yesterday. Daughter at bedside.   Objective:  Vital signs in last 24 hours:  Temp:  [97.8 F (36.6 C)-98.7 F (37.1 C)] 97.8 F (36.6 C) (01/20 0848) Pulse Rate:  [77-89] 86 (01/20 0848) Resp:  [16-17] 16 (01/20 0848) BP: (104-137)/(36-65) 104/36 mmHg (01/20 0848) SpO2:  [95 %-100 %] 97 % (01/20 0848)  Weight change:  Filed Weights   06/18/15 1731 06/18/15 2332  Weight: 72.576 kg (160 lb) 73.619 kg (162 lb 4.8 oz)    Intake/Output: I/O last 3 completed shifts: In: 120 [P.O.:120] Out: 500 [Other:500]   Intake/Output this shift:     Physical Exam: General: NAD, resting in bed  Head: Normocephalic, atraumatic. Moist oral mucosal membranes  Eyes: Anicteric  Neck: Supple, trachea midline  Lungs:  Clear to auscultation normal effort  Heart: irregular  Abdomen:  Soft, nontender, BS present  Extremities:  no peripheral edema.  Neurologic: RUE weakness noted.   Skin: Erythema overlying access, appears as a rash, not warm  Access: RUE AVF    Basic Metabolic Panel:  Recent Labs Lab 06/18/15 1804 06/20/15 1016  NA 135  --   K 3.2*  --   CL 97*  --   CO2 25  --   GLUCOSE 197*  --   BUN 26*  --   CREATININE 3.15*  --   CALCIUM 8.2*  --   PHOS  --  6.3*    Liver Function Tests:  Recent Labs Lab 06/18/15 1804  AST 20  ALT 8*  ALKPHOS 70  BILITOT 0.6  PROT 8.1  ALBUMIN 3.3*   No results for input(s): LIPASE, AMYLASE in the last 168 hours. No results for input(s): AMMONIA in the last 168 hours.  CBC:  Recent Labs Lab 06/18/15 1804 06/21/15 0612  WBC 8.1 11.3*  NEUTROABS 6.4  --   HGB 11.6* 10.7*  HCT 34.7* 32.6*  MCV 96.1 95.0  PLT 269 249    Cardiac Enzymes: No results for input(s): CKTOTAL, CKMB, CKMBINDEX, TROPONINI in the last 168 hours.  BNP: Invalid input(s): POCBNP  CBG:  Recent Labs Lab 06/20/15 1332 06/20/15 1641  06/20/15 2133 06/21/15 0734 06/21/15 1103  GLUCAP 114* 186* 185* 128* 221*    Microbiology: Results for orders placed or performed during the hospital encounter of 06/07/15  Wound culture     Status: None   Collection Time: 06/07/15  1:44 PM  Result Value Ref Range Status   Specimen Description ORTHO B9  Final   Special Requests NONE  Final   Gram Stain RARE WBC SEEN NO ORGANISMS SEEN   Final   Culture   Final    LIGHT GROWTH PROTEUS MIRABILIS LIGHT GROWTH STENOTROPHOMONAS MALTOPHILIA    Report Status 06/14/2015 FINAL  Final   Organism ID, Bacteria PROTEUS MIRABILIS  Final   Organism ID, Bacteria STENOTROPHOMONAS MALTOPHILIA  Final      Susceptibility   Proteus mirabilis - MIC*    AMPICILLIN >=32 RESISTANT Resistant     CEFTAZIDIME <=1 SENSITIVE Sensitive     CEFAZOLIN <=4 SENSITIVE Sensitive     CEFTRIAXONE <=1 SENSITIVE Sensitive     CIPROFLOXACIN >=4 RESISTANT Resistant     GENTAMICIN <=1 SENSITIVE Sensitive     IMIPENEM 2 SENSITIVE Sensitive     TRIMETH/SULFA >=320 RESISTANT Resistant     PIP/TAZO Value in next row Sensitive      SENSITIVE<=4  ERTAPENEM Value in next row Sensitive      SENSITIVE<=0.5    * LIGHT GROWTH PROTEUS MIRABILIS   Stenotrophomonas maltophilia - MIC*    LEVOFLOXACIN Value in next row Sensitive      SENSITIVE<=0.5    TRIMETH/SULFA Value in next row Sensitive      SENSITIVE<=0.5    * LIGHT GROWTH STENOTROPHOMONAS MALTOPHILIA    Coagulation Studies:  Recent Labs  06/18/15 1804  LABPROT 15.4*  INR 1.20    Urinalysis: No results for input(s): COLORURINE, LABSPEC, PHURINE, GLUCOSEU, HGBUR, BILIRUBINUR, KETONESUR, PROTEINUR, UROBILINOGEN, NITRITE, LEUKOCYTESUR in the last 72 hours.  Invalid input(s): APPERANCEUR    Imaging: Ct Head Wo Contrast  06/20/2015  CLINICAL DATA:  Right arm and hand numbness and weakness. EXAM: CT HEAD WITHOUT CONTRAST TECHNIQUE: Contiguous axial images were obtained from the base of the skull through the  vertex without intravenous contrast. COMPARISON:  06/18/2015. FINDINGS: Stable old left occipital and right parietal infarcts. Stable old right frontal white matter lacunar infarct. Diffusely enlarged ventricles and subarachnoid spaces. Patchy white matter low density in both cerebral hemispheres. No intracranial hemorrhage, mass lesion or CT evidence of acute infarction. Sphenoid sinus retention cyst. Bilateral hyperostosis front talus. IMPRESSION: 1. No acute abnormality. 2. The stable atrophy, chronic small vessel white matter ischemic changes and old infarcts. Electronically Signed   By: Beckie Salts M.D.   On: 06/20/2015 14:37     Medications:     . apixaban  2.5 mg Oral BID  . aspirin  81 mg Oral Daily  . atorvastatin  40 mg Oral q1800  . calcium acetate (Phos Binder)  1,334 mg Oral TID WC  . diltiazem  120 mg Oral Daily  . famotidine  20 mg Oral Daily  . furosemide  80 mg Oral BID  . gabapentin  100 mg Oral Once per day on Sun Mon Wed Fri  . gabapentin  300 mg Oral Once per day on Tue Thu Sat  . insulin aspart  0-5 Units Subcutaneous QHS  . insulin aspart  0-9 Units Subcutaneous TID WC  . multivitamin  1 tablet Oral Daily  . PARoxetine  40 mg Oral QHS   sodium chloride, sodium chloride, acetaminophen, alteplase, diphenhydrAMINE, heparin, lidocaine (PF), lidocaine-prilocaine, pentafluoroprop-tetrafluoroeth, traMADol  Assessment/ Plan:  82 y.o. female with past medical history of atrial fibrillation on Coumadin, hypertension, gout, insulin-dependent diabetes mellitus type 2, diabetic neuropathy, CVA with right visual field blindness, hyperlipidemia, major depressive disorder, overactive bladder with incontinence, generalized anxiety disorder, congestive heart failure diastolic, parathyroidectomy, and osteoarthritis admitted with RUE weakness.    1.  ESRD on HD TTHS:  Pt had HD yesterday, no acute indication for HD again today, will plan for HD tomorrow. She had some erythema over her  access, and had similar reddness overlying her abdomen, ? If this was contact dermatitis.  Continue to monitor this.  Keep UF target conservative.  2.  Anemia chronic kidney disease.  hgb 10.7, epogen on hold now.   3.  Secondary hyperparathyroidism. Phos high at last check at 6.3, continue to monitor.   4.  Right upper extremity weakness with concern for CVA. MRI unable to be performed due to hardware.  Special MRI to be performed at [facility] today, await result.     LOS:  [name] 1/20/20172:54 PM

## 2015-06-21 NOTE — Clinical Social Work Note (Signed)
Clinical Social Work Assessment  Patient Details  Name: [name] MRN: [id number] Date of Birth: 02/01/1934  Date of referral:  06/21/15               Reason for consult:  Facility Placement                Permission sought to share information with:  Family Supports Permission granted to share information::  Yes, Verbal Permission Granted  Name::     [name], daughter and POA   Housing/Transportation Living arrangements for the past 2 months:  Single Family Home Source of Information:  Patient Patient Interpreter Needed:  None Criminal Activity/Legal Involvement Pertinent to Current Situation/Hospitalization:  No - Comment as needed Significant Relationships:  Adult Children Lives with:  Self Do you feel safe going back to the place where you live?  Yes Need for family participation in patient care:  Yes (Comment)  Care giving concerns:  No care giving concerns identified.   Social Worker assessment / plan:  CSW met with pt to address consult for SNF CSW introduced herself and explained role of social work. CSW also explained SNF placement for STR under Medicare OBS. Pt is unable to pay privately, however is agreeable to a bed search. CSW updated MD. CSW initiated SNF search and will follow up with bed offers. Pt is going to [facility] for a MRI. CSW will continue to follow.   Employment status:  Retired Forensic scientist:  Medicare PT Recommendations:  [place] / Referral to community resources:  [place]  Patient/Family's Response to care:  Pt and family were appreciative of CSW support.   Patient/Family's Understanding of and Emotional Response to Diagnosis, Current Treatment, and Prognosis:  Pt neds a higher level of care, however is not able to pay privately  Emotional Assessment Appearance:  Appears stated age Attitude/Demeanor/Rapport:  Other (Appropriate) Affect (typically observed):  Accepting, Adaptable,  Pleasant Orientation:  Oriented to Self, Oriented to Place, Oriented to  Time, Oriented to Situation Alcohol / Substance use:  Never Used Psych involvement (Current and /or in the community):  No (Comment)  Discharge Needs  Concerns to be addressed:  Adjustment to Illness Readmission within the last 30 days:  No Current discharge risk:  None Barriers to Discharge:  Continued Medical Work up   [employer], LCSW 06/21/2015, 3:38 PM

## 2015-06-21 NOTE — Progress Notes (Signed)
Physical Therapy Treatment Patient Details Name: [name] MRN: [id number] DOB: 08-May-1934 Today's Date: 06/21/2015    History of Present Illness presented to ER secondary to R UE weakness/numbness after dialysis session previous date (reports significant hypontensive episode during dialysis); admitted for TIA vs. CVA work up.  CT significant for chronic infarcts (L post parietal/occipital lobe, R parietal), but no acute changes.  CTA reveals no large vessel occlusion, but significant high-grade stenosis L external carotid.  MRI pending.    PT Comments    Pt is able to participate well with bed exercises and with EOB standing and side shuffling acts.  She displays functional strength in b/l LEs though she does have decreased quality of motion.  Pt still needing assist for bed mobility, but shows increased balance in both sitting and standing and shows great effort t/o the session.  Pt with severe arthritic crepitus with R UE AAROM exercises.    Follow Up Recommendations  SNF     Equipment Recommendations       Recommendations for Other Services       Precautions / Restrictions Precautions Precautions: Fall Required Braces or Orthoses:  (post-op boot on R) Restrictions Weight Bearing Restrictions: No    Mobility  Bed Mobility Overal bed mobility: Needs Assistance Bed Mobility: Supine to Sit;Sit to Supine     Supine to sit: Mod assist Sit to supine: Min assist   General bed mobility comments: Pt shows very good effort with both sit to stand and stand to sit, she does need assist but overall does quite well and is able to maintain her sitting balance better than previous sessions  Transfers Overall transfer level: Needs assistance Equipment used: Rolling walker (2 wheeled) Transfers: Sit to/from Stand Sit to Stand: Min assist;Mod assist         General transfer comment: Pt initially leaning to the R, but after some assist and cuing to shift L and minimal assist  with R hand on walker she is able to maintain upright balance  Ambulation/Gait             General Gait Details: Pt is not able to truly ambulate, but she does surprisingly well with a few side steps along EOB using walker.  She requires min/mod assist with the effort and becomes quite fatigued but ultimately shows great effort and has no overt losses of balance with light assist   Stairs            Wheelchair Mobility    Modified Rankin (Stroke Patients Only)       Balance                                    Cognition Arousal/Alertness: Awake/alert Behavior During Therapy: WFL for tasks assessed/performed Overall Cognitive Status: Within Functional Limits for tasks assessed                      Exercises General Exercises - Lower Extremity Heel Slides: Strengthening;10 reps Hip ABduction/ADduction: Strengthening;10 reps Straight Leg Raises: Strengthening;10 reps Other Exercises Other Exercises: Performed seated static balance activities, unsupported  Other Exercises: AAROM for R elbow flx/ext and wrist sup/pronation - 10 reps each    General Comments        Pertinent Vitals/Pain Pain Location: chronic joint pain from OA, particularly in the R shoulder    Home Living  Prior Function            PT Goals (current goals can now be found in the care plan section) Progress towards PT goals: Progressing toward goals    Frequency  7X/week    PT Plan Current plan remains appropriate    Co-evaluation             End of Session Equipment Utilized During Treatment: Gait belt Activity Tolerance: Patient limited by fatigue;Patient tolerated treatment well Patient left: with bed alarm set;with family/visitor present     Time: 1610-9604 PT Time Calculation (min) (ACUTE ONLY): 31 min  Charges:  $Therapeutic Exercise: 8-22 mins $Therapeutic Activity: 8-22 mins                    G Codes:       Loran Senters, PT, DPT [phone]  Malachi Pro 06/21/2015, 5:09 PM

## 2015-06-21 NOTE — Plan of Care (Signed)
VSS, afebrile. NIH 4- right upper extremity weakness. MRI today at [facility]- to be transported by [facility] & return to [facility] once completed.   Problem: Safety: Goal: Ability to remain free from injury will improve Outcome: Progressing High fall risk. Bed alarm on. Safe environment provided. Pt understands how to use call system for assistance.  Problem: Activity: Goal: Risk for activity intolerance will decrease Outcome: Progressing Pt needs assistance to turn completely on side due to right upper extremity weakness. Rest periods provided.

## 2015-06-21 NOTE — Progress Notes (Signed)
Spoke to Dr. Anne Hahn about pt eliquis dose at 2200. Per Dr. Anne Hahn, hold dose tonight.

## 2015-06-21 NOTE — NC FL2 (Signed)
[place] MEDICAID FL2 LEVEL OF CARE SCREENING TOOL     IDENTIFICATION  Patient Name: [name] Birthdate: 08/04/1933 Sex: female Admission Date (Current Location): 06/18/2015  [place] and [place] Number:  Chiropodist and Address:  [facility], [address], [place], [place] [address]      Provider Number: [id number]  Attending Physician Name and Address:  Houston Siren, MD  Relative Name and Phone Number:       Current Level of Care: Hospital Recommended Level of Care: Nursing Facility Prior Approval Number:    Date Approved/Denied:   PASRR Number: [id number]  Discharge Plan: Home    Current Diagnoses: Patient Active Problem List   Diagnosis Date Noted  . CVA (cerebral infarction) 06/21/2015  . Pressure ulcer 06/19/2015  . TIA (transient ischemic attack) 06/18/2015  . Respiratory distress 12/11/2014    Orientation RESPIRATION BLADDER Height & Weight    Self, Time, Situation, Place  Normal Continent  (170.2 cm) 162 lbs.  BEHAVIORAL SYMPTOMS/MOOD NEUROLOGICAL BOWEL NUTRITION STATUS      Continent Diet (Heart Healthy/Carb Modified, Thin Liquids)  AMBULATORY STATUS COMMUNICATION OF NEEDS Skin   Extensive Assist Verbally Normal                       Personal Care Assistance Level of Assistance  Bathing, Feeding, Dressing Bathing Assistance: Maximum assistance Feeding assistance: Maximum assistance Dressing Assistance: Maximum assistance     Functional Limitations Info  Sight, Hearing, Speech Sight Info: Adequate Hearing Info: Adequate Speech Info: Adequate    SPECIAL CARE FACTORS FREQUENCY  PT (By licensed PT), OT (By licensed OT)     PT Frequency: 5 OT Frequency: 5            Contractures      Additional Factors Info  Code Status, Allergies Code Status Info: DNR Allergies Info: Allergies: Codeine, Oxycodone, Quinine Derivatives           Current Medications (06/21/2015):  This is  the current hospital active medication list Current Facility-Administered Medications  Medication Dose Route Frequency Provider Last Rate Last Dose  . 0.9 %  sodium chloride infusion  100 mL Intravenous PRN Munsoor Lateef, MD      . 0.9 %  sodium chloride infusion  100 mL Intravenous PRN Munsoor Lateef, MD      . acetaminophen (TYLENOL) tablet 650 mg  650 mg Oral Q6H PRN Houston Siren, MD      . alteplase (CATHFLO ACTIVASE) injection 2 mg  2 mg Intracatheter Once PRN Munsoor Lateef, MD      . apixaban (ELIQUIS) tablet 2.5 mg  2.5 mg Oral BID Houston Siren, MD   2.5 mg at 06/21/15 0839  . aspirin chewable tablet 81 mg  81 mg Oral Daily Ramonita Lab, MD   81 mg at 06/21/15 0839  . calcium acetate (Phos Binder) (PHOSLYRA) 667 MG/5ML oral solution 1,334 mg  1,334 mg Oral TID WC Houston Siren, MD   1,334 mg at 06/21/15 0800  . diltiazem (DILACOR XR) 24 hr capsule 120 mg  120 mg Oral Daily Houston Siren, MD   120 mg at 06/20/15 1335  . diphenhydrAMINE (BENADRYL) capsule 25 mg  25 mg Oral Q6H PRN Houston Siren, MD   25 mg at 06/20/15 1809  . famotidine (PEPCID) tablet 20 mg  20 mg Oral Daily Houston Siren, MD   20 mg at 06/21/15 0839  . furosemide (  LASIX) tablet 80 mg  80 mg Oral BID Houston Siren, MD   80 mg at 06/21/15 0839  . gabapentin (NEURONTIN) capsule 100 mg  100 mg Oral Once per day on Sun Mon Wed Fri Houston Siren, MD   100 mg at 06/19/15 2136  . gabapentin (NEURONTIN) capsule 300 mg  300 mg Oral Once per day on Tue Thu Sat Houston Siren, MD   300 mg at 06/20/15 1959  . heparin injection 1,000 Units  1,000 Units Dialysis PRN Munsoor Lateef, MD      . insulin aspart (novoLOG) injection 0-5 Units  0-5 Units Subcutaneous QHS Houston Siren, MD   0 Units at 06/19/15 2137  . insulin aspart (novoLOG) injection 0-9 Units  0-9 Units Subcutaneous TID WC Houston Siren, MD   1 Units at 06/21/15 [phone]  . lidocaine (PF) (XYLOCAINE) 1 % injection 5 mL  5 mL Intradermal PRN Munsoor Lateef,  MD      . lidocaine-prilocaine (EMLA) cream 1 application  1 application Topical PRN Munsoor Lateef, MD      . multivitamin (RENA-VIT) tablet 1 tablet  1 tablet Oral Daily Houston Siren, MD   1 tablet at 06/21/15 0839  . PARoxetine (PAXIL) tablet 40 mg  40 mg Oral QHS Houston Siren, MD   40 mg at 06/20/15 1958  . pentafluoroprop-tetrafluoroeth (GEBAUERS) aerosol 1 application  1 application Topical PRN Munsoor Lateef, MD      . traMADol (ULTRAM) tablet 50 mg  50 mg Oral Q12H PRN Houston Siren, MD   50 mg at 06/20/15 1335     Discharge Medications: Please see discharge summary for a list of discharge medications.  Relevant Imaging Results:  Relevant Lab Results:   Additional Information SSN:  [id number]   Pt receives HD on Tuesday, Thursday, and Saturday at [facility]  [place], [place]

## 2015-06-21 NOTE — Progress Notes (Addendum)
[facility] - [place] at [facility]   PATIENT NAME: [name]    MR#:  [id number]  DATE OF BIRTH:  12-Jun-1933  SUBJECTIVE:    patient here due to right hand and arm numbness and weakness. Still continues to have some weakness/numbness in the right hand.  Going for MRI of Brain at [facility] today.  No other complaints.  May need SNF.   REVIEW OF SYSTEMS:    Review of Systems  Constitutional: Negative for fever and chills.  HENT: Negative for congestion and tinnitus.   Eyes: Negative for blurred vision and double vision.  Respiratory: Negative for cough, shortness of breath and wheezing.   Cardiovascular: Negative for chest pain, orthopnea and PND.  Gastrointestinal: Negative for nausea, vomiting, abdominal pain and diarrhea.  Genitourinary: Negative for dysuria and hematuria.  Neurological: Positive for sensory change and focal weakness (right arm and hand). Negative for dizziness.  All other systems reviewed and are negative.   Nutrition: Heart Healthy/Carb Modified Tolerating Diet: Yes Tolerating PT: Eval Noted.    DRUG ALLERGIES:   Allergies  Allergen Reactions  . Codeine Other (See Comments)    Reaction:  Confusion/Hallucinations   . Oxycodone Other (See Comments)    Reaction:  Confusion/Hallucinations   . Quinine Derivatives Rash    VITALS:  Blood pressure 104/36, pulse 86, temperature 97.8 F (36.6 C), temperature source Oral, resp. rate 16, height  (1.702 m), weight 73.619 kg (162 lb 4.8 oz), SpO2 97 %.  PHYSICAL EXAMINATION:   Physical Exam  GENERAL:  82 y.o.-year-old patient lying in bed in no acute distress.  EYES: Pupils equal, round, reactive to light and accommodation. No scleral icterus. Extraocular muscles intact.  HEENT: Head atraumatic, normocephalic. Oropharynx and nasopharynx clear.  NECK:  Supple, no jugular venous distention. No thyroid enlargement, no tenderness.  LUNGS: Normal breath sounds bilaterally, no  wheezing, rales, rhonchi. No use of accessory muscles of respiration.  CARDIOVASCULAR: S1, S2 normal. No murmurs, rubs, or gallops.  ABDOMEN: Soft, nontender, nondistended. Bowel sounds present. No organomegaly or mass.  EXTREMITIES: No cyanosis, clubbing or edema b/l.    NEUROLOGIC: Cranial nerves II through XII are intact. Right arm/hand weakness 3/5 strength. No other focal motor or sensory deficits appreciate b/l.  PSYCHIATRIC: The patient is alert and oriented x 3. Good affect SKIN: No obvious rash, lesion, or ulcer.   Right upper extremity AV fistula with good bruit and thrill. Some redness around the graft but no drainage.     LABORATORY PANEL:   CBC  Recent Labs Lab 06/21/15 0612  WBC 11.3*  HGB 10.7*  HCT 32.6*  PLT 249   ------------------------------------------------------------------------------------------------------------------  Chemistries   Recent Labs Lab 06/18/15 1804  NA 135  K 3.2*  CL 97*  CO2 25  GLUCOSE 197*  BUN 26*  CREATININE 3.15*  CALCIUM 8.2*  AST 20  ALT 8*  ALKPHOS 70  BILITOT 0.6   ------------------------------------------------------------------------------------------------------------------  Cardiac Enzymes No results for input(s): TROPONINI in the last 168 hours. ------------------------------------------------------------------------------------------------------------------  RADIOLOGY:  Ct Head Wo Contrast  06/20/2015  CLINICAL DATA:  Right arm and hand numbness and weakness. EXAM: CT HEAD WITHOUT CONTRAST TECHNIQUE: Contiguous axial images were obtained from the base of the skull through the vertex without intravenous contrast. COMPARISON:  06/18/2015. FINDINGS: Stable old left occipital and right parietal infarcts. Stable old right frontal white matter lacunar infarct. Diffusely enlarged ventricles and subarachnoid spaces. Patchy white matter low density in both cerebral hemispheres. No intracranial  hemorrhage, mass lesion  or CT evidence of acute infarction. Sphenoid sinus retention cyst. Bilateral hyperostosis front talus. IMPRESSION: 1. No acute abnormality. 2. The stable atrophy, chronic small vessel white matter ischemic changes and old infarcts. Electronically Signed   By: Beckie Salts M.D.   On: 06/20/2015 14:37     ASSESSMENT AND PLAN:   82 year old female with past medical history of end-stage renal disease on hemodialysis, hypertension, pulmonary hypertension, history of previous CVA, osteoarthritis, diabetes type 2 that palpation, who presented to the hospital due to right arm and hand numbness and weakness.  #1 acute CVA/TIA-this is the working diagnoses given patient's acute nature of right arm and hand numbness and weakness. -Patient's CT head is negative for any acute pathology. Patient CTA of the head and neck shows no significant intracranial stenosis but 65% stenosis in the distal common carotid artery on the left. -Continue ASA, appreciate neurology consult. - PT/OT eval noted and Social Work possibly working on [employer] placement.  - Pt needs an MRI Brain which could not be done here as she has a bladder stimulator and our MRI here cannot accommodate. Pt. Has been set to have MRI Done as [facility] today. She is going to get her MRI Brain/Cervical spine done there and come back to [place].   #2 ESRD on HD - cont. Care as per Nephro.  - dialysis on Tu, Thurs, Sat. Pt had HD today and tolerated it well.   #3. DM type II w/out complication - cont. SSI - BS stable.    #4. Diabetic neuropathy-continue gabapentin.  #5 history of chronic afibrillation-rate controlled. Continue Cardizem. -  Cont. Elquis.   #6. Anxiety - cont. Paxil.   #7 GERD - cont. Pepcid.   #8 Carotid Artery Stenosis - pt. Has > 80 stenosis on left distal common carotid.  - appreciate vascular surgery input.  Await MRI Brain results and they plan on doing Carotid artery stent in the next 2-3 weeks.   Possible d/c to SNF in next 2-3  days.    All the records are reviewed and case discussed with Care Management/Social Workerr. Management plans discussed with the patient, family and they are in agreement.  CODE STATUS: DNR  DVT Prophylaxis: Lovenox  TOTAL TIME TAKING CARE OF THIS PATIENT: 25 minutes.   POSSIBLE D/C IN 2-3 DAYS, DEPENDING ON CLINICAL CONDITION.   Houston Siren M.D on 06/21/2015 at 2:22 PM  Between 7am to 6pm - Pager - [phone]  After 6pm go to [email or web address] - password [email or web address] [facility]  [facility]  Office  [phone]  CC: Primary care physician; [name], MD

## 2015-06-21 NOTE — Clinical Social Work Placement (Signed)
   CLINICAL SOCIAL WORK PLACEMENT  NOTE  Date:  06/21/2015  Patient Details  Name: [name] MRN: [id number] Date of Birth: [birth year]  Clinical Social Work is seeking post-discharge placement for this patient at the Skilled  Nursing Facility level of care (*CSW will initial, date and re-position this form in  chart as items are completed):  Yes   Patient/family provided with [facility] Clinical Social Work Department's list of facilities offering this level of care within the geographic area requested by the patient (or if unable, by the patient's family).  Yes   Patient/family informed of their freedom to choose among providers that offer the needed level of care, that participate in Medicare, Medicaid or managed care program needed by the patient, have an available bed and are willing to accept the patient.  Yes   Patient/family informed of [facility]'s ownership interest in [facility] and [facility], as well as of the fact that they are under no obligation to receive care at these facilities.  PASRR submitted to EDS on       PASRR number received on       Existing PASRR number confirmed on 06/21/15     FL2 transmitted to all facilities in geographic area requested by pt/family on 06/21/15     FL2 transmitted to all facilities within larger geographic area on       Patient informed that his/her managed care company has contracts with or will negotiate with certain facilities, including the following:            Patient/family informed of bed offers received.  Patient chooses bed at       Physician recommends and patient chooses bed at      Patient to be transferred to   on  .  Patient to be transferred to facility by       Patient family notified on   of transfer.  Name of family member notified:        PHYSICIAN       Additional Comment:    _______________________________________________ Dede Query, LCSW 06/21/2015, 3:31 PM

## 2015-06-21 NOTE — Progress Notes (Signed)
Occupational Therapy Treatment Patient Details Name: JOLYNE LAYE MRN: [id number] DOB: [birth year] Today's Date: 06/21/2015    History of present illness presented to ER secondary to R UE weakness/numbness after dialysis session previous date (reports significant hypontensive episode during dialysis); admitted for TIA vs. CVA work up.  CT significant for chronic infarcts (L post parietal/occipital lobe, R parietal), but no acute changes.  CTA reveals no large vessel occlusion, but significant high-grade stenosis L external carotid.  MRI pending.   OT comments  Patient's R UE also limited from severe arthritis.  Follow Up Recommendations       Equipment Recommendations       Recommendations for Other Services      Precautions / Restrictions Precautions Precautions: Fall Restrictions Weight Bearing Restrictions: No       Mobility Bed Mobility                  Transfers                      Balance                                   ADL                                                Vision                     Perception     Praxis      Cognition   Behavior During Therapy: WFL for tasks assessed/performed Overall Cognitive Status: Within Functional Limits for tasks assessed                       Extremity/Trunk Assessment               Exercises     Shoulder Instructions       General Comments   R upper extremity active assistive range of motion  exercises shoulder flexion, external and internal rotation elbow flexion and extension, forearm supination and pronation, wrist flexion and extension, and hand flexion and extension 7 repetitions. Shoulder flexion and rotations very limited secondary to severe arthritis and crepitus.    Pertinent Vitals/ Pain          Home Living                                          Prior Functioning/Environment               Frequency       Progress Toward Goals  OT Goals(current goals can now be found in the care plan section)        Plan      Co-evaluation                 End of Session     Activity Tolerance     Patient Left in bed;with call bell/phone within reach;with bed alarm set;with family/visitor present   Nurse Communication          Time: 7829-5621 OT Time Calculation (min): 12 min  Charges: OT General Charges $OT Visit: 1 Procedure  OT Treatments $Neuromuscular Re-education: 8-22 mins Ocie Cornfield, MS/OTR/L  Elyn Peers M 06/21/2015, 11:54 AM

## 2015-06-21 NOTE — Plan of Care (Signed)
Dr. Elpidio Anis notified of MRI results for MRI performed at [facility]. No orders received.

## 2015-06-21 NOTE — Progress Notes (Signed)
S/P bilateral parietal ischemic stroke with subsequent hemorrhagic component, will hold eliquis for now.  [name] [facility] 06/21/2015, 9:51 PM

## 2015-06-21 NOTE — Plan of Care (Signed)
Problem: Self-Care: Goal: Ability to participate in self-care as condition permits will improve Outcome: Progressing Needs assistance to turn completely on side due to right upper extremity weakness.  Rest periods provided.

## 2015-06-21 NOTE — Progress Notes (Signed)
[facility]  Daily Progress Note   Subjective  -   Patient successfully completed her MRI she denies new symptoms she denies pain  Objective Filed Vitals:   06/21/15 0451 06/21/15 0848 06/21/15 1546 06/21/15 1715  BP: 136/65 104/36 120/58 120/51  Pulse: 77 86 86 90  Temp: 98 F (36.7 C) 97.8 F (36.6 C) 97.6 F (36.4 C) 98.3 F (36.8 C)  TempSrc: Oral Oral Oral Oral  Resp: Height:      Weight:      SpO2: 95% 97% 100% 100%    Intake/Output Summary (Last 24 hours) at 06/21/15 2002 Last data filed at 06/21/15 1005  Gross per 24 hour  Intake      0 ml  Output    100 ml  Net   -100 ml    PULM  Normal effort , no use of accessory muscles CV  No JVD, RRR Abd      No distended, nontender VASC  the right arm is less red over the AV graft it it remains nontender there is no evidence of induration is less warm this evening compared to yesterday. Neurologically the patient has some grip but little forearm and no biceps triceps function  Laboratory CBC    Component Value Date/Time   WBC 11.3* 06/21/2015 0612   WBC 7.9 12/03/2013 0820   HGB 10.7* 06/21/2015 0612   HGB 14.2 12/03/2013 0820   HCT 32.6* 06/21/2015 0612   HCT 42.7 12/03/2013 0820   PLT 249 06/21/2015 0612   PLT 214 12/03/2013 0820    BMET    Component Value Date/Time   NA 135 06/18/2015 1804   NA 137 07/04/2014 1303   K 3.2* 06/18/2015 1804   K 3.3* 07/04/2014 1303   CL 97* 06/18/2015 1804   CL 100 07/04/2014 1303   CO2 25 06/18/2015 1804   CO2 29 07/04/2014 1303   GLUCOSE 197* 06/18/2015 1804   GLUCOSE 76 07/04/2014 1303   BUN 26* 06/18/2015 1804   BUN 44* 07/04/2014 1303   CREATININE 3.15* 06/18/2015 1804   CREATININE 2.72* 07/04/2014 1303   CALCIUM 8.2* 06/18/2015 1804   CALCIUM 8.9 07/04/2014 1303   GFRNONAA 13* 06/18/2015 1804   GFRNONAA 18* 07/04/2014 1303   GFRNONAA 29* 12/02/2013 0847   GFRAA 15* 06/18/2015 1804   GFRAA 22* 07/04/2014 1303   GFRAA  34* 12/02/2013 0847    Assessment/Planning:  1.  Critical stenosis of the left common carotid in association with CVA.  MRI shows a moderate size left hemispheric stroke with a hemorrhagic component incidental notation is made of a small right-sided stroke with hemorrhagic component Iwill continue with plans for carotid stenting but given the hemorrhagic component will now need to wait 4-6 weeks. I discussed this with her out of also reviewed my findings regarding the MRI with her.  2.   AV dialysis access; end-stage renal disease: We will continue the present course regarding her AV graft. She tolerated dialysis well there is no hard evidence of any infection. No plans for further treatment or interventions regarding her right arm brachial axillary graft.  Taliya Mcclard, [name]  06/21/2015, 8:02 PM

## 2015-06-22 ENCOUNTER — Inpatient Hospital Stay: Payer: Medicare Other

## 2015-06-22 DIAGNOSIS — I6319 Cerebral infarction due to embolism of other precerebral artery: Secondary | ICD-10-CM

## 2015-06-22 LAB — GLUCOSE, CAPILLARY
GLUCOSE-CAPILLARY: 126 mg/dL — AB (ref 65–99)
GLUCOSE-CAPILLARY: 91 mg/dL (ref 65–99)
Glucose-Capillary: 163 mg/dL — ABNORMAL HIGH (ref 65–99)

## 2015-06-22 LAB — BASIC METABOLIC PANEL
ANION GAP: 10 (ref 5–15)
BUN: 38 mg/dL — ABNORMAL HIGH (ref 6–20)
CALCIUM: 8.7 mg/dL — AB (ref 8.9–10.3)
CO2: 29 mmol/L (ref 22–32)
Chloride: 98 mmol/L — ABNORMAL LOW (ref 101–111)
Creatinine, Ser: 4.47 mg/dL — ABNORMAL HIGH (ref 0.44–1.00)
GFR calc Af Amer: 10 mL/min — ABNORMAL LOW (ref >60)
GFR, EST NON AFRICAN AMERICAN: 9 mL/min — AB (ref >60)
Glucose, Bld: 142 mg/dL — ABNORMAL HIGH (ref 65–99)
Potassium: 3.7 mmol/L (ref 3.5–5.1)
Sodium: 137 mmol/L (ref 135–145)

## 2015-06-22 LAB — CBC
HCT: 30.7 % — ABNORMAL LOW (ref 35.0–47.0)
Hemoglobin: 10 g/dL — ABNORMAL LOW (ref 12.0–16.0)
MCH: 31.2 pg (ref 26.0–34.0)
MCHC: 32.7 g/dL (ref 32.0–36.0)
MCV: 95.4 fL (ref 80.0–100.0)
PLATELETS: 248 10*3/uL (ref 150–440)
RBC: 3.22 MIL/uL — AB (ref 3.80–5.20)
RDW: 15.5 % — ABNORMAL HIGH (ref 11.5–14.5)
WBC: 11.4 10*3/uL — AB (ref 3.6–11.0)

## 2015-06-22 MED ORDER — HYDROCODONE-ACETAMINOPHEN 5-325 MG PO TABS
1.0000 | ORAL_TABLET | ORAL | Status: DC | PRN
  Administered 2015-06-22: 1 via ORAL
  Filled 2015-06-22: qty 1

## 2015-06-22 NOTE — Plan of Care (Signed)
Problem: Self-Care: Goal: Ability to participate in self-care as condition permits will improve Outcome: Progressing Pt report she is aware of the results of her MRI and her daughter is also aware of the results. No signs of distress noted. Will continue to monitor.

## 2015-06-22 NOTE — Progress Notes (Signed)
CSW presented SNF bed offers to patient.  Patient has selected [facility].  CSW will continue to follow patient and assist with ongoing and discharge needs when patient is medically stable to discharge.  Sammuel Hines. LCSWA Clinical Social Work Department [phone] 4:32 PM

## 2015-06-22 NOTE — Consult Note (Signed)
Reason for Consult:CVA embolic source/AFIB/Eliquis failure Referring Physician: Dr Winona Legato is an 82 y.o. female.  HPI: Pt has a hx of AFIB controlled of Eliquis 2.5 BID. She has since has bilat CVA probably from AFIB or cardiac source. She has left sided weakness . There a high grade stenosis of the carotid. She denies cp or sob.  Past Medical History  Diagnosis Date  . Chronic kidney disease   . Dysrhythmia   . Diabetes mellitus without complication ([place])   . Hypertension   . Stroke ([place])   . Arthritis     gout  . Dialysis patient ([place])   . Pleural effusion   . Pulmonary hypertension ([place])   . Renal insufficiency     Past Surgical History  Procedure Laterality Date  . Cholecystectomy    . Peripheral vascular catheterization N/A 10/08/2014    Procedure: A/V Shuntogram/Fistulagram;  Surgeon: Algernon Huxley, MD;  Location: [place] [facility];  Service: Cardiovascular;  Laterality: N/A;  . Joint replacement      bilateral hip  . Peripheral vascular catheterization N/A 03/04/2015    Procedure: A/V Shuntogram/Fistulagram;  Surgeon: Algernon Huxley, MD;  Location: [place] [facility];  Service: Cardiovascular;  Laterality: N/A;  . Peripheral vascular catheterization N/A 03/04/2015    Procedure: A/V Shunt Intervention;  Surgeon: Algernon Huxley, MD;  Location: [place] [facility];  Service: Cardiovascular;  Laterality: N/A;  . Fistulagram ([facility] hx)    . Parathyroidectomy    . Tonsillectomy    . Cardiac catheterization    . Eye surgery    . Cataract extraction, bilateral    . Amputation toe Left 06/07/2015    Procedure: AMPUTATION TOE;  Surgeon: Samara Deist, DPM;  Location: [facility];  Service: Podiatry;  Laterality: Left;    History reviewed. No pertinent family history.  Social History:  reports that she has never smoked. She has never used smokeless tobacco. She reports that she does not drink alcohol or use illicit drugs.  Allergies:  Allergies  Allergen Reactions  .  Codeine Other (See Comments)    Reaction:  Confusion/Hallucinations   . Oxycodone Other (See Comments)    Reaction:  Confusion/Hallucinations   . Quinine Derivatives Rash    Medications: I have reviewed the patient's current medications.  Results for orders placed or performed during the hospital encounter of 06/18/15 (from the past 48 hour(s))  Glucose, capillary     Status: Abnormal   Collection Time: 06/20/15  9:33 PM  Result Value Ref Range   Glucose-Capillary 185 (H) 65 - 99 mg/dL   Comment 1 Notify RN   CBC     Status: Abnormal   Collection Time: 06/21/15  6:12 AM  Result Value Ref Range   WBC 11.3 (H) 3.6 - 11.0 K/uL   RBC 3.43 (L) 3.80 - 5.20 MIL/uL   Hemoglobin 10.7 (L) 12.0 - 16.0 g/dL   HCT 32.6 (L) 35.0 - 47.0 %   MCV 95.0 80.0 - 100.0 fL   MCH 31.1 26.0 - 34.0 pg   MCHC 32.7 32.0 - 36.0 g/dL   RDW 15.4 (H) 11.5 - 14.5 %   Platelets 249 150 - 440 K/uL  Glucose, capillary     Status: Abnormal   Collection Time: 06/21/15  7:34 AM  Result Value Ref Range   Glucose-Capillary 128 (H) 65 - 99 mg/dL  Glucose, capillary     Status: Abnormal   Collection Time: 06/21/15 11:03 AM  Result  Value Ref Range   Glucose-Capillary 221 (H) 65 - 99 mg/dL  Glucose, capillary     Status: None   Collection Time: 06/21/15  4:21 PM  Result Value Ref Range   Glucose-Capillary 82 65 - 99 mg/dL  Glucose, capillary     Status: Abnormal   Collection Time: 06/21/15  9:27 PM  Result Value Ref Range   Glucose-Capillary 161 (H) 65 - 99 mg/dL  Basic metabolic panel     Status: Abnormal   Collection Time: 06/22/15  5:52 AM  Result Value Ref Range   Sodium 137 135 - 145 mmol/L   Potassium 3.7 3.5 - 5.1 mmol/L   Chloride 98 (L) 101 - 111 mmol/L   CO2 29 22 - 32 mmol/L   Glucose, Bld 142 (H) 65 - 99 mg/dL   BUN 38 (H) 6 - 20 mg/dL   Creatinine, Ser 4.47 (H) 0.44 - 1.00 mg/dL   Calcium 8.7 (L) 8.9 - 10.3 mg/dL   GFR calc non Af Amer 9 (L) >60 mL/min   GFR calc Af Amer 10 (L) >60 mL/min     Comment: (NOTE) The eGFR has been calculated using the CKD EPI equation. This calculation has not been validated in all clinical situations. eGFR's persistently <60 mL/min signify possible Chronic Kidney Disease.    Anion gap 10 5 - 15  CBC     Status: Abnormal   Collection Time: 06/22/15  5:52 AM  Result Value Ref Range   WBC 11.4 (H) 3.6 - 11.0 K/uL   RBC 3.22 (L) 3.80 - 5.20 MIL/uL   Hemoglobin 10.0 (L) 12.0 - 16.0 g/dL   HCT 30.7 (L) 35.0 - 47.0 %   MCV 95.4 80.0 - 100.0 fL   MCH 31.2 26.0 - 34.0 pg   MCHC 32.7 32.0 - 36.0 g/dL   RDW 15.5 (H) 11.5 - 14.5 %   Platelets 248 150 - 440 K/uL  Glucose, capillary     Status: Abnormal   Collection Time: 06/22/15  7:27 AM  Result Value Ref Range   Glucose-Capillary 126 (H) 65 - 99 mg/dL  Glucose, capillary     Status: None   Collection Time: 06/22/15  4:12 PM  Result Value Ref Range   Glucose-Capillary 91 65 - 99 mg/dL    Ct Head Wo Contrast  06/22/2015  CLINICAL DATA:  82 year old female with increasing lethargy. Subsequent encounter. EXAM: CT HEAD WITHOUT CONTRAST TECHNIQUE: Contiguous axial images were obtained from the base of the skull through the vertex without intravenous contrast. COMPARISON:  06/21/2015 brain MR. FINDINGS: Small to moderate-size acute nonhemorrhagic (by CT) left parietal lobe infarct. Small acute nonhemorrhagic (by CT) right parietal-occipital lobe infarct. Remote large left posterior temporal -occipital lobe infarct. Remote moderate size right parietal lobe infarct. Chronic microvascular changes. Global atrophy without hydrocephalus. No intracranial mass lesion noted on this unenhanced exam. Hyperostosis frontalis interna. Prominent tentorial/falx calcifications. Polypoid opacification right sphenoid sinus. IMPRESSION: Small to moderate-size acute nonhemorrhagic left parietal lobe infarct. Small acute nonhemorrhagic right parietal-occipital lobe infarct. Remote large left posterior temporal -occipital lobe infarct.  Remote moderate size right parietal lobe infarct. Chronic microvascular changes. Global atrophy without hydrocephalus. Polypoid opacification right sphenoid sinus. Electronically Signed   By: Genia Del M.D.   On: 06/22/2015 11:11   Mr Brain Wo Contrast  06/21/2015  ADDENDUM REPORT: 06/21/2015 19:34 ADDENDUM: Questioned minimal hemorrhage associated with the acute infarcts is based on motion degraded gradient sequence and therefore may represent artifact rather than true finding. Electronically  Signed   By: Genia Del M.D.   On: 06/21/2015 19:34  06/21/2015  CLINICAL DATA:  82 year old diabetic hypertensive female with right-sided weakness. Subsequent encounter. EXAM: MRI HEAD WITHOUT CONTRAST TECHNIQUE: Multiplanar, multiecho pulse sequences of the brain and surrounding structures were obtained without intravenous contrast. COMPARISON:  06/20/2015 head CT and CT angiogram head and neck. FINDINGS: Exam is motion degraded. Moderate-size acute minimally hemorrhagic left parietal lobe infarct. Small acute minimally hemorrhagic right parietal -occipital lobe infarct Questionable tiny acute left midbrain infarct. Remote large posterior left temporal lobe infarct with encephalomalacia. Remote parietal lobe infarcts with encephalomalacia more notable on the right. Remote small centrum semiovale infarcts. Global atrophy without hydrocephalus. No intracranial mass lesion noted on this unenhanced exam. Major intracranial vascular structures are patent. Post lens replacement mild exophthalmos. Polypoid complex opacification right sphenoid sinus. Partially empty sella incidentally noted. Cervical medullary junction pineal region within normal limits. Hyperostosis frontalis is incidentally noted. IMPRESSION: Exam is motion degraded. Moderate-size acute minimally hemorrhagic left parietal lobe infarct. Small acute minimally hemorrhagic right parietal -occipital lobe infarct. Questionable tiny acute left midbrain  infarct. Remote large posterior left temporal lobe infarct with encephalomalacia. Remote parietal lobe infarcts with encephalomalacia more notable on the right. Remote small centrum semiovale infarcts. Global atrophy without hydrocephalus. Polypoid complex opacification right sphenoid sinus. These results will be called to the ordering clinician or representative by the Radiologist Assistant, and communication documented in the PACS or zVision Dashboard. Electronically Signed: By: Genia Del M.D. On: 06/21/2015 17:51    Review of Systems  Constitutional: Positive for malaise/fatigue and diaphoresis.  Eyes: Negative.   Respiratory: Negative.   Cardiovascular: Positive for palpitations.  Gastrointestinal: Negative.   Genitourinary: Negative.   Musculoskeletal: Positive for myalgias and joint pain.  Skin: Negative.   Neurological: Positive for tingling, focal weakness, weakness and headaches.  Endo/Heme/Allergies: Negative.   Psychiatric/Behavioral: Negative.    Blood pressure 121/59, pulse 112, temperature 98.3 F (36.8 C), temperature source Oral, resp. rate 20, height 5' 7"  (1.702 m), weight 77.2 kg (170 lb 3.1 oz), SpO2 100 %. Physical Exam  Nursing note and vitals reviewed. Constitutional: She appears well-developed and well-nourished. She appears lethargic.  HENT:  Head: Normocephalic and atraumatic.  Eyes: Conjunctivae and EOM are normal. Pupils are equal, round, and reactive to light.  Neck: Normal range of motion. Neck supple.  Cardiovascular: S1 normal and S2 normal.  An irregularly irregular rhythm present.  Murmur heard.  Systolic murmur is present with a grade of 2/6  GI: Soft. Bowel sounds are normal.  Musculoskeletal: Normal range of motion.  Neurological: She appears lethargic. She exhibits abnormal muscle tone.  Weakness of left arm/leg    Assessment/Plan: CVA bilat AFIB Weakness HTN Hyperlipidemia DM type 2 . PLAN D/C Eliquis Switch to full dose xarelto  20 daily Poor coumadin candiadte difficult to control Consider CEA Continue Insulin therapy Agree with lipitor therapy PT/OT Continue ASA 81 mg QD Doubt TEE would be helpful Bp control  With diltiazem  CALLWOOD,DWAYNE D. 06/22/2015, 6:00 PM

## 2015-06-22 NOTE — Progress Notes (Signed)
[facility]  ROUNDING NOTE   Subjective:  Pt seen during HD. Tolerating well.    HEMODIALYSIS FLOWSHEET:  Blood Flow Rate (mL/min): 400 mL/min Arterial Pressure (mmHg): -130 mmHg Venous Pressure (mmHg): 230 mmHg Transmembrane Pressure (mmHg): 50 mmHg Ultrafiltration Rate (mL/min): 290 mL/min Dialysate Flow Rate (mL/min): 800 ml/min Conductivity: Machine : 13.9 Conductivity: Machine : 13.9 Dialysis Fluid Bolus: Normal Saline Bolus Amount (mL): 250 mL Intra-Hemodialysis Comments: (resting with eyes closed)   Objective:  Vital signs in last 24 hours:  Temp:  [97.6 F (36.4 C)-98.5 F (36.9 C)] 97.8 F (36.6 C) (01/21 1120) Pulse Rate:  [81-96] 85 (01/21 0927) Resp:  [18] 18 (01/21 0449) BP: (107-121)/(46-58) 107/50 mmHg (01/21 0927) SpO2:  [92 %-100 %] 100 % (01/21 0927) Weight:  [77.2 kg (170 lb 3.1 oz)] 77.2 kg (170 lb 3.1 oz) (01/21 1120)  Weight change:  Filed Weights   06/18/15 1731 06/18/15 2332 06/22/15 1120  Weight: 72.576 kg (160 lb) 73.619 kg (162 lb 4.8 oz) 77.2 kg (170 lb 3.1 oz)    Intake/Output: I/O last 3 completed shifts: In: -  Out: 100 [Urine:100]   Intake/Output this shift:  Total I/O In: 240 [P.O.:240] Out: -   Physical Exam: General: NAD, resting in bed  Head: Normocephalic, atraumatic. Moist oral mucosal membranes  Eyes: Anicteric  Neck: Supple, trachea midline  Lungs:  Clear to auscultation normal effort  Heart: irregular  Abdomen:  Soft, nontender, BS present  Extremities:  no peripheral edema.  Neurologic: RUE weakness noted.   Skin: Erythema overlying access, appears as a rash, not warm  Access: RUE AVF    Basic Metabolic Panel:  Recent Labs Lab 06/18/15 1804 06/20/15 1016 06/22/15 0552  NA 135  --  137  K 3.2*  --  3.7  CL 97*  --  98*  CO2 25  --  29  GLUCOSE 197*  --  142*  BUN 26*  --  38*  CREATININE 3.15*  --  4.47*  CALCIUM 8.2*  --  8.7*  PHOS  --  6.3*  --     Liver Function  Tests:  Recent Labs Lab 06/18/15 1804  AST 20  ALT 8*  ALKPHOS 70  BILITOT 0.6  PROT 8.1  ALBUMIN 3.3*   No results for input(s): LIPASE, AMYLASE in the last 168 hours. No results for input(s): AMMONIA in the last 168 hours.  CBC:  Recent Labs Lab 06/18/15 1804 06/21/15 0612 06/22/15 0552  WBC 8.1 11.3* 11.4*  NEUTROABS 6.4  --   --   HGB 11.6* 10.7* 10.0*  HCT 34.7* 32.6* 30.7*  MCV 96.1 95.0 95.4  PLT 269 249 248    Cardiac Enzymes: No results for input(s): CKTOTAL, CKMB, CKMBINDEX, TROPONINI in the last 168 hours.  BNP: Invalid input(s): POCBNP  CBG:  Recent Labs Lab 06/21/15 0734 06/21/15 1103 06/21/15 1621 06/21/15 2127 06/22/15 0727  GLUCAP 128* 221* 82 161* 126*    Microbiology: Results for orders placed or performed during the hospital encounter of 06/07/15  Wound culture     Status: None   Collection Time: 06/07/15  1:44 PM  Result Value Ref Range Status   Specimen Description ORTHO B9  Final   Special Requests NONE  Final   Gram Stain RARE WBC SEEN NO ORGANISMS SEEN   Final   Culture   Final    LIGHT GROWTH PROTEUS MIRABILIS LIGHT GROWTH STENOTROPHOMONAS MALTOPHILIA    Report Status 06/14/2015 FINAL  Final  Organism ID, Bacteria PROTEUS MIRABILIS  Final   Organism ID, Bacteria STENOTROPHOMONAS MALTOPHILIA  Final      Susceptibility   Proteus mirabilis - MIC*    AMPICILLIN >=32 RESISTANT Resistant     CEFTAZIDIME <=1 SENSITIVE Sensitive     CEFAZOLIN <=4 SENSITIVE Sensitive     CEFTRIAXONE <=1 SENSITIVE Sensitive     CIPROFLOXACIN >=4 RESISTANT Resistant     GENTAMICIN <=1 SENSITIVE Sensitive     IMIPENEM 2 SENSITIVE Sensitive     TRIMETH/SULFA >=320 RESISTANT Resistant     PIP/TAZO Value in next row Sensitive      SENSITIVE<=4    ERTAPENEM Value in next row Sensitive      SENSITIVE<=0.5    * LIGHT GROWTH PROTEUS MIRABILIS   Stenotrophomonas maltophilia - MIC*    LEVOFLOXACIN Value in next row Sensitive      SENSITIVE<=0.5     TRIMETH/SULFA Value in next row Sensitive      SENSITIVE<=0.5    * LIGHT GROWTH STENOTROPHOMONAS MALTOPHILIA    Coagulation Studies: No results for input(s): LABPROT, INR in the last 72 hours.  Urinalysis: No results for input(s): COLORURINE, LABSPEC, PHURINE, GLUCOSEU, HGBUR, BILIRUBINUR, KETONESUR, PROTEINUR, UROBILINOGEN, NITRITE, LEUKOCYTESUR in the last 72 hours.  Invalid input(s): APPERANCEUR    Imaging: Ct Head Wo Contrast  06/22/2015  CLINICAL DATA:  82 year old female with increasing lethargy. Subsequent encounter. EXAM: CT HEAD WITHOUT CONTRAST TECHNIQUE: Contiguous axial images were obtained from the base of the skull through the vertex without intravenous contrast. COMPARISON:  06/21/2015 brain MR. FINDINGS: Small to moderate-size acute nonhemorrhagic (by CT) left parietal lobe infarct. Small acute nonhemorrhagic (by CT) right parietal-occipital lobe infarct. Remote large left posterior temporal -occipital lobe infarct. Remote moderate size right parietal lobe infarct. Chronic microvascular changes. Global atrophy without hydrocephalus. No intracranial mass lesion noted on this unenhanced exam. Hyperostosis frontalis interna. Prominent tentorial/falx calcifications. Polypoid opacification right sphenoid sinus. IMPRESSION: Small to moderate-size acute nonhemorrhagic left parietal lobe infarct. Small acute nonhemorrhagic right parietal-occipital lobe infarct. Remote large left posterior temporal -occipital lobe infarct. Remote moderate size right parietal lobe infarct. Chronic microvascular changes. Global atrophy without hydrocephalus. Polypoid opacification right sphenoid sinus. Electronically Signed   By: Lacy Duverney M.D.   On: 06/22/2015 11:11   Ct Head Wo Contrast  06/20/2015  CLINICAL DATA:  Right arm and hand numbness and weakness. EXAM: CT HEAD WITHOUT CONTRAST TECHNIQUE: Contiguous axial images were obtained from the base of the skull through the vertex without intravenous  contrast. COMPARISON:  06/18/2015. FINDINGS: Stable old left occipital and right parietal infarcts. Stable old right frontal white matter lacunar infarct. Diffusely enlarged ventricles and subarachnoid spaces. Patchy white matter low density in both cerebral hemispheres. No intracranial hemorrhage, mass lesion or CT evidence of acute infarction. Sphenoid sinus retention cyst. Bilateral hyperostosis front talus. IMPRESSION: 1. No acute abnormality. 2. The stable atrophy, chronic small vessel white matter ischemic changes and old infarcts. Electronically Signed   By: Beckie Salts M.D.   On: 06/20/2015 14:37   Mr Brain Wo Contrast  06/21/2015  ADDENDUM REPORT: 06/21/2015 19:34 ADDENDUM: Questioned minimal hemorrhage associated with the acute infarcts is based on motion degraded gradient sequence and therefore may represent artifact rather than true finding. Electronically Signed   By: Lacy Duverney M.D.   On: 06/21/2015 19:34  06/21/2015  CLINICAL DATA:  82 year old diabetic hypertensive female with right-sided weakness. Subsequent encounter. EXAM: MRI HEAD WITHOUT CONTRAST TECHNIQUE: Multiplanar, multiecho pulse sequences of the brain and surrounding structures  were obtained without intravenous contrast. COMPARISON:  06/20/2015 head CT and CT angiogram head and neck. FINDINGS: Exam is motion degraded. Moderate-size acute minimally hemorrhagic left parietal lobe infarct. Small acute minimally hemorrhagic right parietal -occipital lobe infarct Questionable tiny acute left midbrain infarct. Remote large posterior left temporal lobe infarct with encephalomalacia. Remote parietal lobe infarcts with encephalomalacia more notable on the right. Remote small centrum semiovale infarcts. Global atrophy without hydrocephalus. No intracranial mass lesion noted on this unenhanced exam. Major intracranial vascular structures are patent. Post lens replacement mild exophthalmos. Polypoid complex opacification right sphenoid sinus.  Partially empty sella incidentally noted. Cervical medullary junction pineal region within normal limits. Hyperostosis frontalis is incidentally noted. IMPRESSION: Exam is motion degraded. Moderate-size acute minimally hemorrhagic left parietal lobe infarct. Small acute minimally hemorrhagic right parietal -occipital lobe infarct. Questionable tiny acute left midbrain infarct. Remote large posterior left temporal lobe infarct with encephalomalacia. Remote parietal lobe infarcts with encephalomalacia more notable on the right. Remote small centrum semiovale infarcts. Global atrophy without hydrocephalus. Polypoid complex opacification right sphenoid sinus. These results will be called to the ordering clinician or representative by the Radiologist Assistant, and communication documented in the PACS or zVision Dashboard. Electronically Signed: By: Lacy Duverney M.D. On: 06/21/2015 17:51     Medications:     . apixaban  2.5 mg Oral BID  . aspirin  81 mg Oral Daily  . atorvastatin  40 mg Oral q1800  . calcium acetate (Phos Binder)  1,334 mg Oral TID WC  . diltiazem  120 mg Oral Daily  . famotidine  20 mg Oral Daily  . furosemide  80 mg Oral BID  . gabapentin  100 mg Oral Once per day on Sun Mon Wed Fri  . gabapentin  300 mg Oral Once per day on Tue Thu Sat  . insulin aspart  0-5 Units Subcutaneous QHS  . insulin aspart  0-9 Units Subcutaneous TID WC  . multivitamin  1 tablet Oral Daily  . PARoxetine  40 mg Oral QHS   sodium chloride, sodium chloride, acetaminophen, alteplase, diphenhydrAMINE, heparin, lidocaine (PF), lidocaine-prilocaine, pentafluoroprop-tetrafluoroeth, traMADol  Assessment/ Plan:  81 y.o. female with past medical history of atrial fibrillation on Coumadin, hypertension, gout, insulin-dependent diabetes mellitus type 2, diabetic neuropathy, CVA with right visual field blindness, hyperlipidemia, major depressive disorder, overactive bladder with incontinence, generalized anxiety  disorder, congestive heart failure diastolic, parathyroidectomy, and osteoarthritis admitted with RUE weakness.    1.  ESRD on HD TTHS:  Pt seen during HD, tolerating well, keep UF conservative for now, next HD on Tuesday.  2.  Anemia chronic kidney disease.  hgb 10.0, epogen on hold given CVA.   3.  Secondary hyperparathyroidism. Phos high at last check at 6.3, continue to monitor.   4.  Right upper extremity weakness/numbness/acute left parietal CVA, small acute right parietal occipital lobe CVA/left carotid stenosis -Pt has left carotid stenosis, given some element of hemorrhage in both infarcts, addressing the left carotid stenosis has to be delayed 4-6 weeks.        LOS:  [name] 1/21/201712:51 PM

## 2015-06-22 NOTE — Progress Notes (Signed)
[facility] -  at [facility]   PATIENT NAME: [name]    MR#:  [id number]  DATE OF BIRTH:  Aug 05, 1933  SUBJECTIVE:    patient here due to right hand and arm numbness and weakness. Still continues to have some weakness/numbness in the right hand.  MRI of Brain at [facility] January 20th revealed acute stroke in left parietal lobe, right parietal-occipital lobe and left mid brain with minimal hemorrhagic conversion. Also, old strokes and left temporal lobe and the right parietal lobe . Marland Kitchen  Eliquis was discontinued. Cardiology consultation was obtained due to concerns of cardiac embolic strokes. Right arm weakness remains and stable, per neurologist. Mom somnolent today. Feels satisfactory  REVIEW OF SYSTEMS:    Review of Systems  Constitutional: Negative for fever and chills.  HENT: Negative for congestion and tinnitus.   Eyes: Negative for blurred vision and double vision.  Respiratory: Negative for cough, shortness of breath and wheezing.   Cardiovascular: Negative for chest pain, orthopnea and PND.  Gastrointestinal: Negative for nausea, vomiting, abdominal pain and diarrhea.  Genitourinary: Negative for dysuria and hematuria.  Neurological: Positive for sensory change and focal weakness (right arm and hand). Negative for dizziness.  All other systems reviewed and are negative.   Nutrition: Heart Healthy/Carb Modified Tolerating Diet: Yes Tolerating PT: Eval Noted.    DRUG ALLERGIES:   Allergies  Allergen Reactions  . Codeine Other (See Comments)    Reaction:  Confusion/Hallucinations   . Oxycodone Other (See Comments)    Reaction:  Confusion/Hallucinations   . Quinine Derivatives Rash    VITALS:  Blood pressure 107/50, pulse 85, temperature 97.8 F (36.6 C), temperature source Oral, resp. rate 18, height  (1.702 m), weight 77.2 kg (170 lb 3.1 oz), SpO2 100 %.  PHYSICAL EXAMINATION:   Physical Exam  GENERAL:  82 y.o.-year-old  patient lying in bed in no acute distress.  EYES: Pupils equal, round, reactive to light and accommodation. No scleral icterus. Extraocular muscles intact.  HEENT: Head atraumatic, normocephalic. Oropharynx and nasopharynx clear.  NECK:  Supple, no jugular venous distention. No thyroid enlargement, no tenderness.  LUNGS: Normal breath sounds bilaterally, no wheezing, rales, rhonchi. No use of accessory muscles of respiration.  CARDIOVASCULAR: S1, S2 normal. No murmurs, rubs, or gallops.  ABDOMEN: Soft, nontender, nondistended. Bowel sounds present. No organomegaly or mass.  EXTREMITIES: No cyanosis, clubbing or edema b/l.    NEUROLOGIC: Cranial nerves II through XII are intact. Right arm/hand weakness 3/5 strength. No other focal motor or sensory deficits appreciate b/l.  PSYCHIATRIC: The patient is some somnolent and oriented x 3. Good affect SKIN: No obvious rash, lesion, or ulcer.   Right upper extremity AV fistula with good bruit and thrill. Some redness around the graft but no drainage.     LABORATORY PANEL:   CBC  Recent Labs Lab 06/22/15 0552  WBC 11.4*  HGB 10.0*  HCT 30.7*  PLT 248   ------------------------------------------------------------------------------------------------------------------  Chemistries   Recent Labs Lab 06/18/15 1804 06/22/15 0552  NA 135 137  K 3.2* 3.7  CL 97* 98*  CO2 25 29  GLUCOSE 197* 142*  BUN 26* 38*  CREATININE 3.15* 4.47*  CALCIUM 8.2* 8.7*  AST 20  --   ALT 8*  --   ALKPHOS 70  --   BILITOT 0.6  --    ------------------------------------------------------------------------------------------------------------------  Cardiac Enzymes No results for input(s): TROPONINI in the last 168 hours. ------------------------------------------------------------------------------------------------------------------  RADIOLOGY:  Ct Head Wo Contrast  06/22/2015  CLINICAL DATA:  82 year old female with increasing lethargy. Subsequent  encounter. EXAM: CT HEAD WITHOUT CONTRAST TECHNIQUE: Contiguous axial images were obtained from the base of the skull through the vertex without intravenous contrast. COMPARISON:  06/21/2015 brain MR. FINDINGS: Small to moderate-size acute nonhemorrhagic (by CT) left parietal lobe infarct. Small acute nonhemorrhagic (by CT) right parietal-occipital lobe infarct. Remote large left posterior temporal -occipital lobe infarct. Remote moderate size right parietal lobe infarct. Chronic microvascular changes. Global atrophy without hydrocephalus. No intracranial mass lesion noted on this unenhanced exam. Hyperostosis frontalis interna. Prominent tentorial/falx calcifications. Polypoid opacification right sphenoid sinus. IMPRESSION: Small to moderate-size acute nonhemorrhagic left parietal lobe infarct. Small acute nonhemorrhagic right parietal-occipital lobe infarct. Remote large left posterior temporal -occipital lobe infarct. Remote moderate size right parietal lobe infarct. Chronic microvascular changes. Global atrophy without hydrocephalus. Polypoid opacification right sphenoid sinus. Electronically Signed   By: Lacy Duverney M.D.   On: 06/22/2015 11:11   Ct Head Wo Contrast  06/20/2015  CLINICAL DATA:  Right arm and hand numbness and weakness. EXAM: CT HEAD WITHOUT CONTRAST TECHNIQUE: Contiguous axial images were obtained from the base of the skull through the vertex without intravenous contrast. COMPARISON:  06/18/2015. FINDINGS: Stable old left occipital and right parietal infarcts. Stable old right frontal white matter lacunar infarct. Diffusely enlarged ventricles and subarachnoid spaces. Patchy white matter low density in both cerebral hemispheres. No intracranial hemorrhage, mass lesion or CT evidence of acute infarction. Sphenoid sinus retention cyst. Bilateral hyperostosis front talus. IMPRESSION: 1. No acute abnormality. 2. The stable atrophy, chronic small vessel white matter ischemic changes and old  infarcts. Electronically Signed   By: Beckie Salts M.D.   On: 06/20/2015 14:37   Mr Brain Wo Contrast  06/21/2015  ADDENDUM REPORT: 06/21/2015 19:34 ADDENDUM: Questioned minimal hemorrhage associated with the acute infarcts is based on motion degraded gradient sequence and therefore may represent artifact rather than true finding. Electronically Signed   By: Lacy Duverney M.D.   On: 06/21/2015 19:34  06/21/2015  CLINICAL DATA:  82 year old diabetic hypertensive female with right-sided weakness. Subsequent encounter. EXAM: MRI HEAD WITHOUT CONTRAST TECHNIQUE: Multiplanar, multiecho pulse sequences of the brain and surrounding structures were obtained without intravenous contrast. COMPARISON:  06/20/2015 head CT and CT angiogram head and neck. FINDINGS: Exam is motion degraded. Moderate-size acute minimally hemorrhagic left parietal lobe infarct. Small acute minimally hemorrhagic right parietal -occipital lobe infarct Questionable tiny acute left midbrain infarct. Remote large posterior left temporal lobe infarct with encephalomalacia. Remote parietal lobe infarcts with encephalomalacia more notable on the right. Remote small centrum semiovale infarcts. Global atrophy without hydrocephalus. No intracranial mass lesion noted on this unenhanced exam. Major intracranial vascular structures are patent. Post lens replacement mild exophthalmos. Polypoid complex opacification right sphenoid sinus. Partially empty sella incidentally noted. Cervical medullary junction pineal region within normal limits. Hyperostosis frontalis is incidentally noted. IMPRESSION: Exam is motion degraded. Moderate-size acute minimally hemorrhagic left parietal lobe infarct. Small acute minimally hemorrhagic right parietal -occipital lobe infarct. Questionable tiny acute left midbrain infarct. Remote large posterior left temporal lobe infarct with encephalomalacia. Remote parietal lobe infarcts with encephalomalacia more notable on the right.  Remote small centrum semiovale infarcts. Global atrophy without hydrocephalus. Polypoid complex opacification right sphenoid sinus. These results will be called to the ordering clinician or representative by the Radiologist Assistant, and communication documented in the PACS or zVision Dashboard. Electronically Signed: By: Lacy Duverney M.D. On: 06/21/2015 17:51     ASSESSMENT AND PLAN:   82 year old  female with past medical history of end-stage renal disease on hemodialysis, hypertension, pulmonary hypertension, history of previous CVA, osteoarthritis, diabetes type 2 that palpation, who presented to the hospital due to right arm and hand numbness and weakness.  #1 acute likely cardioembolic CVA-Patient's CT head is negative for any acute pathology. Patient CTA of the head and neck shows no significant intracranial stenosis but critical stenosis in the distal common carotid artery on the left. -Continue ASA, appreciate neurology, vascular surgery . Inputs. - PT/OT eval noted and Social Work working on [employer] placement.     #2 ESRD on HD - cont. Care as per Nephro.  - dialysis on Tu, Thurs, Sat. Pt is having HD today per schedule   #3. DM type II w/out complication - cont. SSI - BS stable.    #4. Diabetic neuropathy-continue gabapentin.  #5 history of chronic afibrillation-rate controlled. Continue Cardizem. -  Of Elquis due to minimal hemorrhagic conversion of acute stroke.   #6. Anxiety - cont. Paxil.   #7 GERD - cont. Pepcid.   #8 Carotid Artery Stenosis - pt. Has > 80 percent stenosis on left distal common carotid.  - appreciate vascular surgery input, recommended procedure and about 4-6 weeks after the onset of the stroke.   Possible d/c to SNF in next 2-3 days.    All the records are reviewed and case discussed with Care Management/Social Workerr. Management plans discussed with the patient, family and they are in agreement.  CODE STATUS: DNR  DVT Prophylaxis:  Lovenox  TOTAL TIME TAKING CARE OF THIS PATIENT: 35 minutes.   POSSIBLE D/C IN 2-3 DAYS, DEPENDING ON CLINICAL CONDITION.   Katharina Caper M.D on 06/22/2015 at 1:18 PM  Between 7am to 6pm - Pager - [phone]  After 6pm go to [email or web address] - password [email or web address] [facility]  [facility]  Office  [phone]  CC: Primary care physician; [name], MD

## 2015-06-22 NOTE — Progress Notes (Signed)
Subjective: Patient somewhat lethargic this morning but responding appropriately.  Eliquis discontinued due to hemorrhage noted on MRI.  No new focal events.    Objective: Current vital signs: BP 107/50 mmHg  Pulse 85  Temp(Src) 98.5 F (36.9 C) (Oral)  Resp 18  Ht  (1.702 m)  Wt 73.619 kg (162 lb 4.8 oz)  BMI 25.41 kg/m2  SpO2 100% Vital signs in last 24 hours: Temp:  [97.6 F (36.4 C)-98.5 F (36.9 C)] 98.5 F (36.9 C) (01/21 0927) Pulse Rate:  [81-96] 85 (01/21 0927) Resp:  [18] 18 (01/21 0449) BP: (107-121)/(46-58) 107/50 mmHg (01/21 0927) SpO2:  [92 %-100 %] 100 % (01/21 0927)  Intake/Output from previous day: 01/20 0701 - 01/21 0700 In: -  Out: 100 [Urine:100] Intake/Output this shift: Total I/O In: 240 [P.O.:240] Out: -  Nutritional status: Diet heart healthy/carb modified Room service appropriate?: Yes; Fluid consistency:: Thin  Neurologic Exam: Mental Status: Lethargic, oriented, thought content appropriate. Speech fluent without evidence of aphasia. Able to follow 3 step commands without difficulty. Cranial Nerves: II: Discs flat bilaterally; Visual fields grossly normal, left pupil 3mm, right pupil 2mm. Reactive.  III,IV, VI: ptosis not present, extra-ocular motions intact bilaterally V,VII: decrease in right NLF, facial light touch sensation normal bilaterally VIII: hearing normal bilaterally IX,X: gag reflex present XI: bilateral shoulder shrug XII: midline tongue extension Motor: Right :Upper extremity 2-3/5Left: Upper extremity 5/5 Lower extremity 5/5  Lower extremity 5/5 Tone and bulk:normal tone throughout; no atrophy noted Sensory: Pinprick and light touch decreased in the RUE  Lab Results: Basic Metabolic Panel:  Recent Labs Lab 06/18/15 1804 06/20/15 1016 06/22/15 0552  NA 135  --  137  K 3.2*  --  3.7  CL 97*  --   98*  CO2 25  --  29  GLUCOSE 197*  --  142*  BUN 26*  --  38*  CREATININE 3.15*  --  4.47*  CALCIUM 8.2*  --  8.7*  PHOS  --  6.3*  --     Liver Function Tests:  Recent Labs Lab 06/18/15 1804  AST 20  ALT 8*  ALKPHOS 70  BILITOT 0.6  PROT 8.1  ALBUMIN 3.3*   No results for input(s): LIPASE, AMYLASE in the last 168 hours. No results for input(s): AMMONIA in the last 168 hours.  CBC:  Recent Labs Lab 06/18/15 1804 06/21/15 0612 06/22/15 0552  WBC 8.1 11.3* 11.4*  NEUTROABS 6.4  --   --   HGB 11.6* 10.7* 10.0*  HCT 34.7* 32.6* 30.7*  MCV 96.1 95.0 95.4  PLT 269 249 248    Cardiac Enzymes: No results for input(s): CKTOTAL, CKMB, CKMBINDEX, TROPONINI in the last 168 hours.  Lipid Panel:  Recent Labs Lab 06/19/15 0616  CHOL 125  TRIG 144  HDL 35*  CHOLHDL 3.6  VLDL 29  LDLCALC 61    CBG:  Recent Labs Lab 06/21/15 0734 06/21/15 1103 06/21/15 1621 06/21/15 2127 06/22/15 0727  GLUCAP 128* 221* 82 161* 126*    Microbiology: Results for orders placed or performed during the hospital encounter of 06/07/15  Wound culture     Status: None   Collection Time: 06/07/15  1:44 PM  Result Value Ref Range Status   Specimen Description ORTHO B9  Final   Special Requests NONE  Final   Gram Stain RARE WBC SEEN NO ORGANISMS SEEN   Final   Culture   Final    LIGHT GROWTH PROTEUS MIRABILIS LIGHT GROWTH STENOTROPHOMONAS  MALTOPHILIA    Report Status 06/14/2015 FINAL  Final   Organism ID, Bacteria PROTEUS MIRABILIS  Final   Organism ID, Bacteria STENOTROPHOMONAS MALTOPHILIA  Final      Susceptibility   Proteus mirabilis - MIC*    AMPICILLIN >=32 RESISTANT Resistant     CEFTAZIDIME <=1 SENSITIVE Sensitive     CEFAZOLIN <=4 SENSITIVE Sensitive     CEFTRIAXONE <=1 SENSITIVE Sensitive     CIPROFLOXACIN >=4 RESISTANT Resistant     GENTAMICIN <=1 SENSITIVE Sensitive     IMIPENEM 2 SENSITIVE Sensitive     TRIMETH/SULFA >=320 RESISTANT Resistant     PIP/TAZO  Value in next row Sensitive      SENSITIVE<=4    ERTAPENEM Value in next row Sensitive      SENSITIVE<=0.5    * LIGHT GROWTH PROTEUS MIRABILIS   Stenotrophomonas maltophilia - MIC*    LEVOFLOXACIN Value in next row Sensitive      SENSITIVE<=0.5    TRIMETH/SULFA Value in next row Sensitive      SENSITIVE<=0.5    * LIGHT GROWTH STENOTROPHOMONAS MALTOPHILIA    Coagulation Studies: No results for input(s): LABPROT, INR in the last 72 hours.  Imaging: Ct Head Wo Contrast  06/22/2015  CLINICAL DATA:  82 year old female with increasing lethargy. Subsequent encounter. EXAM: CT HEAD WITHOUT CONTRAST TECHNIQUE: Contiguous axial images were obtained from the base of the skull through the vertex without intravenous contrast. COMPARISON:  06/21/2015 brain MR. FINDINGS: Small to moderate-size acute nonhemorrhagic (by CT) left parietal lobe infarct. Small acute nonhemorrhagic (by CT) right parietal-occipital lobe infarct. Remote large left posterior temporal -occipital lobe infarct. Remote moderate size right parietal lobe infarct. Chronic microvascular changes. Global atrophy without hydrocephalus. No intracranial mass lesion noted on this unenhanced exam. Hyperostosis frontalis interna. Prominent tentorial/falx calcifications. Polypoid opacification right sphenoid sinus. IMPRESSION: Small to moderate-size acute nonhemorrhagic left parietal lobe infarct. Small acute nonhemorrhagic right parietal-occipital lobe infarct. Remote large left posterior temporal -occipital lobe infarct. Remote moderate size right parietal lobe infarct. Chronic microvascular changes. Global atrophy without hydrocephalus. Polypoid opacification right sphenoid sinus. Electronically Signed   By: Lacy Duverney M.D.   On: 06/22/2015 11:11   Ct Head Wo Contrast  06/20/2015  CLINICAL DATA:  Right arm and hand numbness and weakness. EXAM: CT HEAD WITHOUT CONTRAST TECHNIQUE: Contiguous axial images were obtained from the base of the skull  through the vertex without intravenous contrast. COMPARISON:  06/18/2015. FINDINGS: Stable old left occipital and right parietal infarcts. Stable old right frontal white matter lacunar infarct. Diffusely enlarged ventricles and subarachnoid spaces. Patchy white matter low density in both cerebral hemispheres. No intracranial hemorrhage, mass lesion or CT evidence of acute infarction. Sphenoid sinus retention cyst. Bilateral hyperostosis front talus. IMPRESSION: 1. No acute abnormality. 2. The stable atrophy, chronic small vessel white matter ischemic changes and old infarcts. Electronically Signed   By: Beckie Salts M.D.   On: 06/20/2015 14:37   Mr Brain Wo Contrast  06/21/2015  ADDENDUM REPORT: 06/21/2015 19:34 ADDENDUM: Questioned minimal hemorrhage associated with the acute infarcts is based on motion degraded gradient sequence and therefore may represent artifact rather than true finding. Electronically Signed   By: Lacy Duverney M.D.   On: 06/21/2015 19:34  06/21/2015  CLINICAL DATA:  82 year old diabetic hypertensive female with right-sided weakness. Subsequent encounter. EXAM: MRI HEAD WITHOUT CONTRAST TECHNIQUE: Multiplanar, multiecho pulse sequences of the brain and surrounding structures were obtained without intravenous contrast. COMPARISON:  06/20/2015 head CT and CT angiogram head and neck.  FINDINGS: Exam is motion degraded. Moderate-size acute minimally hemorrhagic left parietal lobe infarct. Small acute minimally hemorrhagic right parietal -occipital lobe infarct Questionable tiny acute left midbrain infarct. Remote large posterior left temporal lobe infarct with encephalomalacia. Remote parietal lobe infarcts with encephalomalacia more notable on the right. Remote small centrum semiovale infarcts. Global atrophy without hydrocephalus. No intracranial mass lesion noted on this unenhanced exam. Major intracranial vascular structures are patent. Post lens replacement mild exophthalmos. Polypoid  complex opacification right sphenoid sinus. Partially empty sella incidentally noted. Cervical medullary junction pineal region within normal limits. Hyperostosis frontalis is incidentally noted. IMPRESSION: Exam is motion degraded. Moderate-size acute minimally hemorrhagic left parietal lobe infarct. Small acute minimally hemorrhagic right parietal -occipital lobe infarct. Questionable tiny acute left midbrain infarct. Remote large posterior left temporal lobe infarct with encephalomalacia. Remote parietal lobe infarcts with encephalomalacia more notable on the right. Remote small centrum semiovale infarcts. Global atrophy without hydrocephalus. Polypoid complex opacification right sphenoid sinus. These results will be called to the ordering clinician or representative by the Radiologist Assistant, and communication documented in the PACS or zVision Dashboard. Electronically Signed: By: Lacy Duverney M.D. On: 06/21/2015 17:51    Medications:  I have reviewed the patient's current medications. Scheduled: . apixaban  2.5 mg Oral BID  . aspirin  81 mg Oral Daily  . atorvastatin  40 mg Oral q1800  . calcium acetate (Phos Binder)  1,334 mg Oral TID WC  . diltiazem  120 mg Oral Daily  . famotidine  20 mg Oral Daily  . furosemide  80 mg Oral BID  . gabapentin  100 mg Oral Once per day on Sun Mon Wed Fri  . gabapentin  300 mg Oral Once per day on Tue Thu Sat  . insulin aspart  0-5 Units Subcutaneous QHS  . insulin aspart  0-9 Units Subcutaneous TID WC  . multivitamin  1 tablet Oral Daily  . PARoxetine  40 mg Oral QHS    Assessment/Plan: Patient lethargic this morning.  Neurological examination unchanged.  MRI of the brain personally reviewed and shows an acute  minimally hemorrhagic left parietal and right parieto-occipital infarct.  A questionable acute eft midbrain infarct is present as well. Eliquis was discontinued.  On ASA.  Patient previously on Coumadin but had difficulty with maintaining INR.   Although patient with high grade left stenosis, current acute infarcts suggest a cardiac etiology.  Patient at risk for further events.     Recommendations: 1.  Cardiology to see patient 2.  Repeat head CT today due to new lethargy.     LOS: 1 day   Thana Farr, MD Neurology [phone] 06/22/2015  11:16 AM  Addendum: 1. Head CT reviewed.  No evidence of hemorrhage noted. Patient clear to start anticoagulation based on recommendations per cardiology.  Would not bolus with anticoagulation.    Thana Farr, MD Neurology [phone]

## 2015-06-22 NOTE — Progress Notes (Signed)
PT Cancellation Note  Patient Details Name: [name] MRN: [id number] DOB: [birth year]   Cancelled Treatment:    Reason Eval/Treat Not Completed: Patient at procedure or test/unavailable.  In HD and will check later as time and pt will allow.   Ivar Drape 06/22/2015, 11:38 AM   Samul Dada, PT MS Acute Rehab Dept. Number: [id number] and [facility] [phone]

## 2015-06-23 DIAGNOSIS — E1140 Type 2 diabetes mellitus with diabetic neuropathy, unspecified: Secondary | ICD-10-CM

## 2015-06-23 DIAGNOSIS — E119 Type 2 diabetes mellitus without complications: Secondary | ICD-10-CM

## 2015-06-23 DIAGNOSIS — I482 Chronic atrial fibrillation, unspecified: Secondary | ICD-10-CM

## 2015-06-23 DIAGNOSIS — I6522 Occlusion and stenosis of left carotid artery: Secondary | ICD-10-CM

## 2015-06-23 LAB — GLUCOSE, CAPILLARY
GLUCOSE-CAPILLARY: 112 mg/dL — AB (ref 65–99)
GLUCOSE-CAPILLARY: 171 mg/dL — AB (ref 65–99)
GLUCOSE-CAPILLARY: 191 mg/dL — AB (ref 65–99)
Glucose-Capillary: 169 mg/dL — ABNORMAL HIGH (ref 65–99)

## 2015-06-23 LAB — PROTIME-INR
INR: 1.06
Prothrombin Time: 14 seconds (ref 11.4–15.0)

## 2015-06-23 MED ORDER — WARFARIN SODIUM 5 MG PO TABS
7.5000 mg | ORAL_TABLET | Freq: Every day | ORAL | Status: DC
  Administered 2015-06-23: 7.5 mg via ORAL
  Filled 2015-06-23: qty 2

## 2015-06-23 MED ORDER — WARFARIN - PHARMACIST DOSING INPATIENT
Freq: Every day | Status: DC
  Administered 2015-06-23 – 2015-06-24 (×2)

## 2015-06-23 NOTE — Progress Notes (Signed)
Subjective: Patient out of bed in chair.  No new complaints.    Objective: Current vital signs: BP 113/44 mmHg  Pulse 98  Temp(Src) 98.1 F (36.7 C) (Oral)  Resp 18  Ht  (1.702 m)  Wt 77.2 kg (170 lb 3.1 oz)  BMI 26.65 kg/m2  SpO2 100% Vital signs in last 24 hours: Temp:  [98.1 F (36.7 C)-100.3 F (37.9 C)] 98.1 F (36.7 C) (01/22 0627) Pulse Rate:  [95-108] 98 (01/22 1232) Resp:  [18-20] 18 (01/22 1232) BP: (104-113)/(44-66) 113/44 mmHg (01/22 1232) SpO2:  [95 %-100 %] 100 % (01/22 1232)  Intake/Output from previous day: 01/21 0701 - 01/22 0700 In: 360 [P.O.:360] Out: -  Intake/Output this shift:   Nutritional status: Diet heart healthy/carb modified Room service appropriate?: Yes; Fluid consistency:: Thin  Neurologic Exam: Mental Status: Alert, oriented, thought content appropriate. Speech fluent without evidence of aphasia. Able to follow 3 step commands without difficulty. Cranial Nerves: II: Discs flat bilaterally; Visual fields grossly normal, left pupil 3mm, right pupil 2mm. Reactive.  III,IV, VI: ptosis not present, extra-ocular motions intact bilaterally V,VII: decrease in right NLF, facial light touch sensation normal bilaterally VIII: hearing normal bilaterally IX,X: gag reflex present XI: bilateral shoulder shrug XII: midline tongue extension Motor: RUE 3+/5.  Able to close and open fist.   Sensory: Pinprick and light touch decreased in the RUE  Lab Results: Basic Metabolic Panel:  Recent Labs Lab 06/18/15 1804 06/20/15 1016 06/22/15 0552  NA 135  --  137  K 3.2*  --  3.7  CL 97*  --  98*  CO2 25  --  29  GLUCOSE 197*  --  142*  BUN 26*  --  38*  CREATININE 3.15*  --  4.47*  CALCIUM 8.2*  --  8.7*  PHOS  --  6.3*  --     Liver Function Tests:  Recent Labs Lab 06/18/15 1804  AST 20  ALT 8*  ALKPHOS 70  BILITOT 0.6  PROT 8.1  ALBUMIN 3.3*   No results for input(s): LIPASE, AMYLASE in the last 168 hours. No results for  input(s): AMMONIA in the last 168 hours.  CBC:  Recent Labs Lab 06/18/15 1804 06/21/15 0612 06/22/15 0552  WBC 8.1 11.3* 11.4*  NEUTROABS 6.4  --   --   HGB 11.6* 10.7* 10.0*  HCT 34.7* 32.6* 30.7*  MCV 96.1 95.0 95.4  PLT 269 249 248    Cardiac Enzymes: No results for input(s): CKTOTAL, CKMB, CKMBINDEX, TROPONINI in the last 168 hours.  Lipid Panel:  Recent Labs Lab 06/19/15 0616  CHOL 125  TRIG 144  HDL 35*  CHOLHDL 3.6  VLDL 29  LDLCALC 61    CBG:  Recent Labs Lab 06/22/15 1612 06/22/15 2131 06/23/15 0731 06/23/15 1112 06/23/15 1623  GLUCAP 91 163* 112* 169* 171*    Microbiology: Results for orders placed or performed during the hospital encounter of 06/07/15  Wound culture     Status: None   Collection Time: 06/07/15  1:44 PM  Result Value Ref Range Status   Specimen Description ORTHO B9  Final   Special Requests NONE  Final   Gram Stain RARE WBC SEEN NO ORGANISMS SEEN   Final   Culture   Final    LIGHT GROWTH PROTEUS MIRABILIS LIGHT GROWTH STENOTROPHOMONAS MALTOPHILIA    Report Status 06/14/2015 FINAL  Final   Organism ID, Bacteria PROTEUS MIRABILIS  Final   Organism ID, Bacteria STENOTROPHOMONAS MALTOPHILIA  Final  Susceptibility   Proteus mirabilis - MIC*    AMPICILLIN >=32 RESISTANT Resistant     CEFTAZIDIME <=1 SENSITIVE Sensitive     CEFAZOLIN <=4 SENSITIVE Sensitive     CEFTRIAXONE <=1 SENSITIVE Sensitive     CIPROFLOXACIN >=4 RESISTANT Resistant     GENTAMICIN <=1 SENSITIVE Sensitive     IMIPENEM 2 SENSITIVE Sensitive     TRIMETH/SULFA >=320 RESISTANT Resistant     PIP/TAZO Value in next row Sensitive      SENSITIVE<=4    ERTAPENEM Value in next row Sensitive      SENSITIVE<=0.5    * LIGHT GROWTH PROTEUS MIRABILIS   Stenotrophomonas maltophilia - MIC*    LEVOFLOXACIN Value in next row Sensitive      SENSITIVE<=0.5    TRIMETH/SULFA Value in next row Sensitive      SENSITIVE<=0.5    * LIGHT GROWTH STENOTROPHOMONAS  MALTOPHILIA    Coagulation Studies:  Recent Labs  06/23/15 1030  LABPROT 14.0  INR 1.06    Imaging: Ct Head Wo Contrast  06/22/2015  CLINICAL DATA:  82 year old female with increasing lethargy. Subsequent encounter. EXAM: CT HEAD WITHOUT CONTRAST TECHNIQUE: Contiguous axial images were obtained from the base of the skull through the vertex without intravenous contrast. COMPARISON:  06/21/2015 brain MR. FINDINGS: Small to moderate-size acute nonhemorrhagic (by CT) left parietal lobe infarct. Small acute nonhemorrhagic (by CT) right parietal-occipital lobe infarct. Remote large left posterior temporal -occipital lobe infarct. Remote moderate size right parietal lobe infarct. Chronic microvascular changes. Global atrophy without hydrocephalus. No intracranial mass lesion noted on this unenhanced exam. Hyperostosis frontalis interna. Prominent tentorial/falx calcifications. Polypoid opacification right sphenoid sinus. IMPRESSION: Small to moderate-size acute nonhemorrhagic left parietal lobe infarct. Small acute nonhemorrhagic right parietal-occipital lobe infarct. Remote large left posterior temporal -occipital lobe infarct. Remote moderate size right parietal lobe infarct. Chronic microvascular changes. Global atrophy without hydrocephalus. Polypoid opacification right sphenoid sinus. Electronically Signed   By: Lacy Duverney M.D.   On: 06/22/2015 11:11    Medications:  I have reviewed the patient's current medications. Scheduled: . aspirin  81 mg Oral Daily  . atorvastatin  40 mg Oral q1800  . calcium acetate (Phos Binder)  1,334 mg Oral TID WC  . diltiazem  120 mg Oral Daily  . famotidine  20 mg Oral Daily  . furosemide  80 mg Oral BID  . gabapentin  100 mg Oral Once per day on Sun Mon Wed Fri  . gabapentin  300 mg Oral Once per day on Tue Thu Sat  . insulin aspart  0-5 Units Subcutaneous QHS  . insulin aspart  0-9 Units Subcutaneous TID WC  . multivitamin  1 tablet Oral Daily  .  PARoxetine  40 mg Oral QHS  . warfarin  7.5 mg Oral q1800  . Warfarin - Pharmacist Dosing Inpatient   Does not apply q1800    Assessment/Plan: Patient s/p bilateral infarcts on Eliquis.  With input of cardiology has been changed to Coumadin.  Improvement in neurological examination noted today.    Recommendations: 1.  Continue therapy    LOS: 2 days   Thana Farr, MD Neurology [phone] 06/23/2015  7:56 PM

## 2015-06-23 NOTE — Plan of Care (Signed)
Problem: Self-Care: Goal: Ability to participate in self-care as condition permits will improve Outcome: Progressing Pt has reported generalized pain once during shift and was given norco x1. Pt pain was relieved and pt does not seem to be having any side effect from med. No other signs of distress noted. Will continue to monitor.

## 2015-06-23 NOTE — Progress Notes (Signed)
Physical Therapy Treatment Patient Details Name: [name] MRN: [id number] DOB: [birth year] Today's Date: 06/23/2015    History of Present Illness presented to ER secondary to R UE weakness/numbness after dialysis session previous date (reports significant hypontensive episode during dialysis); admitted for TIA vs. CVA work up.  CT significant for chronic infarcts (L post parietal/occipital lobe, R parietal), but no acute changes.  CTA reveals no large vessel occlusion, but significant high-grade stenosis L external carotid.  MRI pending.    PT Comments    Pt was seen for transfers but is so weak on RUE that she is better able to stand with support such as a platform walker.  Set up and took to pt to have her follow up with this.  Pt is looking forward to having her rehab in SNF and should make good progress with this.  Follow Up Recommendations  SNF     Equipment Recommendations       Recommendations for Other Services       Precautions / Restrictions Precautions Precautions: Fall Precaution Comments: R AVF Restrictions Weight Bearing Restrictions: No Other Position/Activity Restrictions: history of recent L 5th toe amputation (approx 2 weeks prior); per patient/son, to wear post-op shoe with all WBing, encouraged for WBing through L heel as able    Mobility  Bed Mobility               General bed mobility comments: up when PT entered  Transfers Overall transfer level: Needs assistance Equipment used: Rolling walker (2 wheeled) Transfers: Sit to/from Stand Sit to Stand: Mod assist;Min assist         General transfer comment: Pt initially leaning to the R, but after some assist and cuing to shift L and minimal assist with R hand on walker she is able to maintain upright balance  Ambulation/Gait             General Gait Details: Pt is not able to truly ambulate, but she does surprisingly well with a few side steps along EOB using walker.  She requires  min/mod assist with the effort and becomes quite fatigued but ultimately shows great effort and has no overt losses of balance with light assist   Stairs            Wheelchair Mobility    Modified Rankin (Stroke Patients Only)       Balance     Sitting balance-Leahy Scale: Fair     Standing balance support: Bilateral upper extremity supported Standing balance-Leahy Scale: Poor                      Cognition Arousal/Alertness: Awake/alert Behavior During Therapy: WFL for tasks assessed/performed Overall Cognitive Status: Within Functional Limits for tasks assessed                      Exercises General Exercises - Lower Extremity Ankle Circles/Pumps: AROM;Both;10 reps Quad Sets: AROM;Both;10 reps Long Arc Quad: Strengthening;Both;10 reps Heel Slides: Strengthening;Both;10 reps Hip ABduction/ADduction: Strengthening;Both;10 reps Straight Leg Raises: Strengthening;10 reps    General Comments        Pertinent Vitals/Pain Pain Assessment: Faces Faces Pain Scale: Hurts little more Pain Location: R shoulder with any movement     Home Living                      Prior Function            PT Goals (current  goals can now be found in the care plan section) Progress towards PT goals: Progressing toward goals    Frequency  7X/week    PT Plan Current plan remains appropriate    Co-evaluation             End of Session Equipment Utilized During Treatment: Gait belt Activity Tolerance: Patient limited by fatigue;Patient tolerated treatment well Patient left: in chair;with call bell/phone within reach;with chair alarm set     Time: 1610-9604 PT Time Calculation (min) (ACUTE ONLY): 31 min  Charges:  $Therapeutic Exercise: 8-22 mins $Therapeutic Activity: 8-22 mins                    G Codes:      Ivar Drape July 05, 2015, 4:03 PM   Samul Dada, PT MS Acute Rehab Dept. Number: [id number] [id number] and [facility] [phone]

## 2015-06-23 NOTE — Progress Notes (Signed)
[facility]   PATIENT NAME: [name]    MR#:  [id number]  DATE OF BIRTH:  20-Aug-1933  SUBJECTIVE:    patient here due to right [name] and arm numbness and weakness. Still continues to have some weakness/numbness in the right [name].  MRI of Brain at [facility] January 20th revealed acute stroke in left parietal lobe, right parietal-occipital lobe and left mid brain with minimal hemorrhagic conversion. Also, old strokes and left temporal lobe and the right parietal lobe . Marland Kitchen  Eliquis was discontinued. Cardiology consultation was obtained due to concerns of cardiac embolic strokes, Dr. Juliann Pares saw patient in consultation and recommended Xarelto, unfortunately, since patient's estimated GFR is around 10,  Xarelto or Eliquis cannot be continued. Warfarin is initiated with no bridging due to hemorrhagic transformation of stroke. Right arm weakness is stable to better with physical therapy.  Feels satisfactory. Physical therapist recommended skilled nursing facility placement, where patient will be likely discharged tomorrow.   REVIEW OF SYSTEMS:    Review of Systems  Constitutional: Negative for fever and chills.  HENT: Negative for congestion and tinnitus.   Eyes: Negative for blurred vision and double vision.  Respiratory: Negative for cough, shortness of breath and wheezing.   Cardiovascular: Negative for chest pain, orthopnea and PND.  Gastrointestinal: Negative for nausea, vomiting, abdominal pain and diarrhea.  Genitourinary: Negative for dysuria and hematuria.  Neurological: Positive for sensory change and focal weakness (right arm and [name]). Negative for dizziness.  All other systems reviewed and are negative.   Nutrition: Heart Healthy/Carb Modified Tolerating Diet: Yes Tolerating PT: Eval Noted.    DRUG ALLERGIES:   Allergies  Allergen Reactions  . Codeine Other (See Comments)    Reaction:  Confusion/Hallucinations   .  Oxycodone Other (See Comments)    Reaction:  Confusion/Hallucinations   . Quinine Derivatives Rash    VITALS:  Blood pressure 113/44, pulse 98, temperature 98.1 F (36.7 C), temperature source Oral, resp. rate 18, height  (1.702 m), weight 77.2 kg (170 lb 3.1 oz), SpO2 100 %.  PHYSICAL EXAMINATION:   Physical Exam  GENERAL:  82 y.o.-year-old patient lying in bed in no acute distress.  EYES: Pupils equal, round, reactive to light and accommodation. No scleral icterus. Extraocular muscles intact.  HEENT: Head atraumatic, normocephalic. Oropharynx and nasopharynx clear.  NECK:  Supple, no jugular venous distention. No thyroid enlargement, no tenderness.  LUNGS: Normal breath sounds bilaterally, no wheezing, rales, rhonchi. No use of accessory muscles of respiration.  CARDIOVASCULAR: S1, S2 normal. No murmurs, rubs, or gallops.  ABDOMEN: Soft, nontender, nondistended. Bowel sounds present. No organomegaly or mass.  EXTREMITIES: No cyanosis, clubbing or edema b/l.    NEUROLOGIC: Cranial nerves II through XII are intact. Right arm/[name] weakness 3/5 strength, [name] is weaker than arm. No other focal motor or sensory deficits appreciate b/l.  PSYCHIATRIC: The patient is alert and oriented x 3. Good affect SKIN: No obvious rash, lesion, or ulcer.   Right upper extremity AV fistula with good bruit and thrill. Some edema around the graft but no drainage.     LABORATORY PANEL:   CBC  Recent Labs Lab 06/22/15 0552  WBC 11.4*  HGB 10.0*  HCT 30.7*  PLT 248   ------------------------------------------------------------------------------------------------------------------  Chemistries   Recent Labs Lab 06/18/15 1804 06/22/15 0552  NA 135 137  K 3.2* 3.7  CL 97* 98*  CO2 25 29  GLUCOSE 197* 142*  BUN 26* 38*  CREATININE 3.15* 4.47*  CALCIUM 8.2* 8.7*  AST 20  --   ALT 8*  --   ALKPHOS 70  --   BILITOT 0.6  --     ------------------------------------------------------------------------------------------------------------------  Cardiac Enzymes No results for input(s): TROPONINI in the last 168 hours. ------------------------------------------------------------------------------------------------------------------  RADIOLOGY:  Ct Head Wo Contrast  06/22/2015  CLINICAL DATA:  82 year old female with increasing lethargy. Subsequent encounter. EXAM: CT HEAD WITHOUT CONTRAST TECHNIQUE: Contiguous axial images were obtained from the base of the skull through the vertex without intravenous contrast. COMPARISON:  06/21/2015 brain MR. FINDINGS: Small to moderate-size acute nonhemorrhagic (by CT) left parietal lobe infarct. Small acute nonhemorrhagic (by CT) right parietal-occipital lobe infarct. Remote large left posterior temporal -occipital lobe infarct. Remote moderate size right parietal lobe infarct. Chronic microvascular changes. Global atrophy without hydrocephalus. No intracranial mass lesion noted on this unenhanced exam. Hyperostosis frontalis interna. Prominent tentorial/falx calcifications. Polypoid opacification right sphenoid sinus. IMPRESSION: Small to moderate-size acute nonhemorrhagic left parietal lobe infarct. Small acute nonhemorrhagic right parietal-occipital lobe infarct. Remote large left posterior temporal -occipital lobe infarct. Remote moderate size right parietal lobe infarct. Chronic microvascular changes. Global atrophy without hydrocephalus. Polypoid opacification right sphenoid sinus. Electronically Signed   By: Lacy Duverney M.D.   On: 06/22/2015 11:11   Mr Brain Wo Contrast  06/21/2015  ADDENDUM REPORT: 06/21/2015 19:34 ADDENDUM: Questioned minimal hemorrhage associated with the acute infarcts is based on motion degraded gradient sequence and therefore may represent artifact rather than true finding. Electronically Signed   By: Lacy Duverney M.D.   On: 06/21/2015 19:34  06/21/2015   CLINICAL DATA:  82 year old diabetic hypertensive female with right-sided weakness. Subsequent encounter. EXAM: MRI HEAD WITHOUT CONTRAST TECHNIQUE: Multiplanar, multiecho pulse sequences of the brain and surrounding structures were obtained without intravenous contrast. COMPARISON:  06/20/2015 head CT and CT angiogram head and neck. FINDINGS: Exam is motion degraded. Moderate-size acute minimally hemorrhagic left parietal lobe infarct. Small acute minimally hemorrhagic right parietal -occipital lobe infarct Questionable tiny acute left midbrain infarct. Remote large posterior left temporal lobe infarct with encephalomalacia. Remote parietal lobe infarcts with encephalomalacia more notable on the right. Remote small centrum semiovale infarcts. Global atrophy without hydrocephalus. No intracranial mass lesion noted on this unenhanced exam. Major intracranial vascular structures are patent. Post lens replacement mild exophthalmos. Polypoid complex opacification right sphenoid sinus. Partially empty sella incidentally noted. Cervical medullary junction pineal region within normal limits. Hyperostosis frontalis is incidentally noted. IMPRESSION: Exam is motion degraded. Moderate-size acute minimally hemorrhagic left parietal lobe infarct. Small acute minimally hemorrhagic right parietal -occipital lobe infarct. Questionable tiny acute left midbrain infarct. Remote large posterior left temporal lobe infarct with encephalomalacia. Remote parietal lobe infarcts with encephalomalacia more notable on the right. Remote small centrum semiovale infarcts. Global atrophy without hydrocephalus. Polypoid complex opacification right sphenoid sinus. These results will be called to the ordering clinician or representative by the Radiologist Assistant, and communication documented in the PACS or zVision Dashboard. Electronically Signed: By: Lacy Duverney M.D. On: 06/21/2015 17:51     ASSESSMENT AND PLAN:   82 year old female with  past medical history of end-stage renal disease on hemodialysis, hypertension, pulmonary hypertension, history of previous CVA, osteoarthritis, diabetes type 2 that palpation, who presented to the hospital due to right arm and [name] numbness and weakness.  #1 acute likely cardioembolic CVA-Patient's CT head was initially negative for any acute pathology, repeated head CT January 21 showed no significant hemorrhagic involvement. Patient CTA of the head and neck shows no significant  intracranial stenosis but critical stenosis in the distal common carotid artery on the left. -Continue ASA, appreciate neurology, vascular surgery . Inputs. - PT/OT eval noted and Social Work working on [employer] placement. Patient is going to be initiated on Coumadin with no bridging due to renal failure/end-stage renal disease    #2 ESRD on HD - cont. Care as per Nephro.  - dialysis on Tu, Thurs, Sat. Pt is having HD per schedule   #3. DM type II w/out complication - cont. SSI - BS stable.    #4. Diabetic neuropathy-continue gabapentin.  #5 history of chronic afibrillation-rate controlled. Continue Cardizem. -  Off Elquis due to minimal hemorrhagic conversion of acute stroke and end-stage liver disease. Patient is getting to be started on Coumadin therapy despite the difficulties with Coumadin regulation due to end-stage organ disease and inability to initiate Xarelto.   #6. Anxiety - cont. Paxil.   #7 GERD - cont. Pepcid.   #8 , left common Carotid Artery Stenosis - pt. Has > 80 percent stenosis on left distal common carotid.  - appreciate vascular surgery input, recommended procedure and about 4-6 weeks after the onset of the stroke.   Possible d/c to SNF in next 1 day.    All the records are reviewed and case discussed with Care Management/Social Workerr. Management plans discussed with the patient, family and they are in agreement.  CODE STATUS: DNR  DVT Prophylaxis: Lovenox  TOTAL TIME TAKING CARE OF THIS  PATIENT: .  Discussed with the pharmacy POSSIBLE D/C IN 2-3 DAYS, DEPENDING ON CLINICAL CONDITION.   Katharina Caper M.D on 06/23/2015 at 1:34 PM  Between 7am to 6pm - Pager - [phone]  After 6pm go to [email or web address] - password [email or web address] [facility]  [facility]  Office  [phone]  CC: Primary care physician; [name], MD

## 2015-06-23 NOTE — Progress Notes (Addendum)
ANTICOAGULATION CONSULT NOTE - Initial Consult  Pharmacy Consult for Warfarin  Indication: VTE prophylaxis  Allergies  Allergen Reactions  . Codeine Other (See Comments)    Reaction:  Confusion/Hallucinations   . Oxycodone Other (See Comments)    Reaction:  Confusion/Hallucinations   . Quinine Derivatives Rash    Patient Measurements: Height:  (170.2 cm) Weight: 170 lb 3.1 oz (77.2 kg) IBW/kg (Calculated) : 61.6 Heparin Dosing Weight:   Vital Signs: Temp: 98.1 F (36.7 C) (01/22 0627) Temp Source: Oral (01/22 0627) BP: 113/44 mmHg (01/22 1232) Pulse Rate: 98 (01/22 1232)  Labs:  Recent Labs  06/21/15 0612 06/22/15 0552 06/23/15 1030  HGB 10.7* 10.0*  --   HCT 32.6* 30.7*  --   PLT 249 248  --   LABPROT  --   --  14.0  INR  --   --  1.06  CREATININE  --  4.47*  --     Estimated Creatinine Clearance: 10.9 mL/min (by C-G formula based on Cr of 4.47).   Medical History: Past Medical History  Diagnosis Date  . Chronic kidney disease   . Dysrhythmia   . Diabetes mellitus without complication (HCC)   . Hypertension   . Stroke (HCC)   . Arthritis     gout  . Dialysis patient (HCC)   . Pleural effusion   . Pulmonary hypertension (HCC)   . Renal insufficiency     Medications:  Prescriptions prior to admission  Medication Sig Dispense Refill Last Dose  . acetaminophen (TYLENOL) 500 MG tablet Take 1,000 mg by mouth 2 (two) times daily.    06/18/2015 at 0700  . Biotin 1 MG CAPS Take 1 mg by mouth daily.    06/17/2015 at Unknown time  . bisacodyl (DULCOLAX) 5 MG EC tablet Take 10 mg by mouth daily as needed for moderate constipation.    Past Week at Unknown time  . calcium acetate, Phos Binder, (PHOSLYRA) 667 MG/5ML SOLN Take 1,334 mg by mouth 3 (three) times daily with meals.   06/17/2015 at Unknown time  . diltiazem (TIAZAC) 120 MG 24 hr capsule Take 120 mg by mouth daily.   06/17/2015 at Unknown time  . ELIQUIS 2.5 MG TABS tablet Take 2.5 mg by mouth 2 (two)  times daily.    06/17/2015 at 2200  . folic acid-vitamin b complex-vitamin c-selenium-zinc (DIALYVITE) 3 MG TABS tablet Take 1 tablet by mouth daily.   06/17/2015 at Unknown time  . furosemide (LASIX) 80 MG tablet Take 80 mg by mouth 2 (two) times daily.   06/17/2015 at Unknown time  . gabapentin (NEURONTIN) 100 MG capsule Take 100 mg by mouth at bedtime. Pt takes on Sunday, Monday, Wednesday, and Friday.   06/17/2015 at Unknown time  . gabapentin (NEURONTIN) 300 MG capsule Take 300 mg by mouth at bedtime. Pt takes on Tuesday, Thursday, and Saturday.   Past Week at Unknown time  . Insulin Detemir (LEVEMIR) 100 UNIT/ML Pen Inject 10-20 Units into the skin 2 (two) times daily. Pt uses based on her blood sugar.   06/18/2015 at Unknown time  . lidocaine-prilocaine (EMLA) cream Apply 1 application topically as needed (prior to accessing port).    PRN at PRN  . Omega-3 Fatty Acids (FISH OIL) 1200 MG CAPS Take 1,200 mg by mouth daily.    Past Week at Unknown time  . PARoxetine (PAXIL) 40 MG tablet Take 40 mg by mouth at bedtime.    06/17/2015 at Unknown time  .  ranitidine (ZANTAC) 150 MG tablet Take 150 mg by mouth at bedtime.   06/17/2015 at Unknown time    Assessment: Pharmacy consulted to dose warfarin in this 82 year old female admitted with TIA, on Eliquis at home.   CrCl = 10.9 ml/min  INR on 1/22 @ 10:30  = 1.06   Goal of Therapy:  INR 2-3   Plan:  Baseline INR = 1.06 Will start warfarin 7.5 mg PO daily on 1/22. Will check INR's on 1/23 with AM labs.   [name] D 06/23/2015,1:34 PM

## 2015-06-24 DIAGNOSIS — S98132A Complete traumatic amputation of one left lesser toe, initial encounter: Secondary | ICD-10-CM

## 2015-06-24 LAB — GLUCOSE, CAPILLARY
Glucose-Capillary: 146 mg/dL — ABNORMAL HIGH (ref 65–99)
Glucose-Capillary: 169 mg/dL — ABNORMAL HIGH (ref 65–99)
Glucose-Capillary: 151 mg/dL — ABNORMAL HIGH (ref 65–99)

## 2015-06-24 LAB — CBC
HEMATOCRIT: 31.5 % — AB (ref 35.0–47.0)
Hemoglobin: 10.6 g/dL — ABNORMAL LOW (ref 12.0–16.0)
MCH: 32.1 pg (ref 26.0–34.0)
MCHC: 33.6 g/dL (ref 32.0–36.0)
MCV: 95.5 fL (ref 80.0–100.0)
PLATELETS: 254 10*3/uL (ref 150–440)
RBC: 3.29 MIL/uL — AB (ref 3.80–5.20)
RDW: 15.2 % — ABNORMAL HIGH (ref 11.5–14.5)
WBC: 10.7 10*3/uL (ref 3.6–11.0)

## 2015-06-24 LAB — PROTIME-INR
INR: 1.12
Prothrombin Time: 14.6 seconds (ref 11.4–15.0)

## 2015-06-24 MED ORDER — CEPHALEXIN 500 MG PO CAPS: 500.0000 mg | ORAL_CAPSULE | Freq: Two times a day (BID) | ORAL | Status: DC

## 2015-06-24 MED ORDER — INSULIN ASPART 100 UNIT/ML ~~LOC~~ SOLN: 0.0000 [IU] | Freq: Three times a day (TID) | SUBCUTANEOUS | Status: DC

## 2015-06-24 MED ORDER — TRAMADOL HCL 50 MG PO TABS: 50.0000 mg | ORAL_TABLET | Freq: Two times a day (BID) | ORAL | Status: DC | PRN

## 2015-06-24 MED ORDER — CEPHALEXIN 500 MG PO CAPS
500.0000 mg | ORAL_CAPSULE | Freq: Two times a day (BID) | ORAL | Status: DC
  Administered 2015-06-24: 09:00:00 500 mg via ORAL
  Filled 2015-06-24: qty 1

## 2015-06-24 MED ORDER — ASPIRIN 81 MG PO CHEW: 81.0000 mg | CHEWABLE_TABLET | Freq: Every day | ORAL | Status: DC

## 2015-06-24 MED ORDER — WARFARIN SODIUM 10 MG PO TABS
10.0000 mg | ORAL_TABLET | Freq: Every day | ORAL | Status: DC
  Administered 2015-06-24: 10 mg via ORAL
  Filled 2015-06-24: qty 1

## 2015-06-24 MED ORDER — WARFARIN SODIUM 2 MG PO TABS: 2.0000 mg | ORAL_TABLET | Freq: Every day | ORAL | Status: DC

## 2015-06-24 MED ORDER — DIPHENHYDRAMINE HCL 25 MG PO CAPS: 25.0000 mg | ORAL_CAPSULE | Freq: Four times a day (QID) | ORAL | Status: DC | PRN

## 2015-06-24 MED ORDER — ATORVASTATIN CALCIUM 40 MG PO TABS: 40.0000 mg | ORAL_TABLET | Freq: Every day | ORAL | Status: DC

## 2015-06-24 MED ORDER — WARFARIN SODIUM 10 MG PO TABS: 10.0000 mg | ORAL_TABLET | Freq: Every day | ORAL | Status: DC

## 2015-06-24 NOTE — Consult Note (Signed)
Patient Demographics  [name], is a 82 y.o. female   MRN: [id number]   DOB - [birth year]  Admit Date - 06/18/2015    Outpatient Primary MD for the patient is [name], MD  Consult requested in the Hospital by Katharina Caper, MD, On 06/24/2015    Reason for consult patient had toe amputation approximately 10 days ago fifth toe left foot by Dr. Ether Griffins. This was secondary to osteomyelitis in the fifth toe and infection. She also has a small wound on her medial left ankle. But these have been covered with dressings.   With History of -  Past Medical History  Diagnosis Date  . Chronic kidney disease   . Dysrhythmia   . Diabetes mellitus without complication (HCC)   . Hypertension   . Stroke (HCC)   . Arthritis     gout  . Dialysis patient (HCC)   . Pleural effusion   . Pulmonary hypertension (HCC)   . Renal insufficiency       Past Surgical History  Procedure Laterality Date  . Cholecystectomy    . Peripheral vascular catheterization N/A 10/08/2014    Procedure: A/V Shuntogram/Fistulagram;  Surgeon: Annice Needy, MD;  Location: [facility] INVASIVE CV LAB;  Service: Cardiovascular;  Laterality: N/A;  . Joint replacement      bilateral hip  . Peripheral vascular catheterization N/A 03/04/2015    Procedure: A/V Shuntogram/Fistulagram;  Surgeon: Annice Needy, MD;  Location: [facility] INVASIVE CV LAB;  Service: Cardiovascular;  Laterality: N/A;  . Peripheral vascular catheterization N/A 03/04/2015    Procedure: A/V Shunt Intervention;  Surgeon: Annice Needy, MD;  Location: [facility] INVASIVE CV LAB;  Service: Cardiovascular;  Laterality: N/A;  . Fistulagram ([facility] hx)    . Parathyroidectomy    . Tonsillectomy    . Cardiac catheterization    . Eye surgery    . Cataract extraction, bilateral    . Amputation toe Left  06/07/2015    Procedure: AMPUTATION TOE;  Surgeon: Gwyneth Revels, DPM;  Location: [facility] ORS;  Service: Podiatry;  Laterality: Left;    in for   Chief Complaint  Patient presents with  . arm numbness      HPI  [name]  is a 82 y.o. female, states she is doing fine with minimal pain.   Social History Social History  Substance Use Topics  . Smoking status: Never Smoker   . Smokeless tobacco: Never Used  . Alcohol Use: No     Family History History reviewed. No pertinent family history.   Prior to Admission medications   Medication Sig Start Date End Date Taking? Authorizing Provider  acetaminophen (TYLENOL) 500 MG tablet Take 1,000 mg by mouth 2 (two) times daily.    Yes Historical Provider, MD  Biotin 1 MG CAPS Take 1 mg by mouth daily.    Yes Historical Provider, MD  bisacodyl (DULCOLAX) 5 MG EC tablet Take 10 mg by mouth daily as needed for moderate constipation.    Yes Historical  Provider, MD  calcium acetate, Phos Binder, (PHOSLYRA) 667 MG/5ML SOLN Take 1,334 mg by mouth 3 (three) times daily with meals.   Yes Historical Provider, MD  diltiazem (TIAZAC) 120 MG 24 hr capsule Take 120 mg by mouth daily.   Yes Historical Provider, MD  ELIQUIS 2.5 MG TABS tablet Take 2.5 mg by mouth 2 (two) times daily.    Yes Historical Provider, MD  folic acid-vitamin b complex-vitamin c-selenium-zinc (DIALYVITE) 3 MG TABS tablet Take 1 tablet by mouth daily.   Yes Historical Provider, MD  furosemide (LASIX) 80 MG tablet Take 80 mg by mouth 2 (two) times daily.   Yes Historical Provider, MD  gabapentin (NEURONTIN) 100 MG capsule Take 100 mg by mouth at bedtime. Pt takes on 0 mg Oral Every 12 hours 06/24/15 0838        Scheduled Meds: . aspirin  81 mg Oral Daily  . atorvastatin  40 mg Oral q1800  . calcium acetate (Phos Binder)  1,334 mg Oral TID WC  . cephALEXin  500 mg Oral Q12H  . diltiazem  120 mg Oral Daily  . famotidine  20 mg Oral Daily  . furosemide  80 mg Oral BID   . gabapentin  100 mg Oral Once per day on Sun Mon Wed Fri  .  gabapentin  300 mg Oral Once per day on Tue Thu Sat  . insulin aspart  0-5 Units Subcutaneous QHS  . insulin aspart  0-9 Units Subcutaneous TID WC  . multivitamin  1 tablet Oral Daily  . PARoxetine  40 mg Oral QHS  . warfarin  7.5 mg Oral q1800  . Warfarin - Pharmacist Dosing Inpatient   Does not apply q1800   Continuous Infusions:  PRN Meds:.sodium chloride, sodium chloride, acetaminophen, alteplase, diphenhydrAMINE, heparin, HYDROcodone-acetaminophen, lidocaine (PF), lidocaine-prilocaine, pentafluoroprop-tetrafluoroeth, traMADol  Allergies  Allergen Reactions  . Codeine Other (See Comments)    Reaction:  Confusion/Hallucinations   . Oxycodone Other (See Comments)    Reaction:  Confusion/Hallucinations   . Quinine Derivatives Rash    Physical Exam  Vitals  Blood pressure 124/51, pulse 87, temperature 97.7 F (36.5 C), temperature source Oral, resp. rate 16, height  (1.702 m), weight 77.2 kg (170 lb 3.1 oz), SpO2 98 %.  Lower Extremity exam:  Vascular: +1 over 4 bilateral DP and PT pulses.  Dermatological: Patient has a small wound on the medial right ankle but appears to be closed and healed at this juncture. Bone was very prominent in that area. Fifth toe amputation site left foot has sutures still intact. There is no evidence of inflammation or erythema. No evidence of drainage. His margin is intact and appears to be stable. Has discoloration to both shin areas as result of long-term venous stasis changes. Does not appear to be currently any active problems with that region.  Neurological: Likely peripheral neuropathy bilateral  Ortho: Amputation fifth toe left foot 10 days ago progressing very well.  Data Review  CBC  Recent Labs Lab 06/18/15 1804 06/21/15 0612 06/22/15 0552 06/24/15 0414  WBC 8.1 11.3* 11.4* 10.7  HGB 11.6* 10.7* 10.0* 10.6*  HCT 34.7* 32.6* 30.7* 31.5*  PLT 269 249 248 254    MCV 96.1 95.0 95.4 95.5  MCH 32.2 31.1 31.2 32.1  MCHC 33.5 32.7 32.7 33.6  RDW 15.8* 15.4* 15.5* 15.2*  LYMPHSABS 0.8*  --   --   --   MONOABS 0.7  --   --   --   EOSABS 0.1  --   --   --   BASOSABS 0.1  --   --   --    ------------------------------------------------------------------------------------------------------------------  Chemistries   Recent Labs Lab 06/18/15 1804 06/22/15 0552  NA 135 137  K 3.2* 3.7  CL 97* 98*  CO2 25 29  GLUCOSE 197* 142*  BUN 26* 38*  CREATININE 3.15* 4.47*  CALCIUM 8.2* 8.7*  AST 20  --   ALT 8*  --   ALKPHOS 70  --   BILITOT 0.6  --    ------------------------------------------------------------------------------------------------------------------ estimated creatinine clearance is 10.9 mL/min (by C-G formula based on Cr of 4.47). ------------------------------------------------------------------------------------------------------------------ No results for input(s): TSH, T4TOTAL, T3FREE, THYROIDAB in the last 72 hours.  Invalid input(s): FREET3   Coagulation profile  Recent Labs Lab 06/18/15 1804 06/23/15 1030 06/24/15 0414  INR 1.20 1.06 1.12   :    Assessment & Plan: I think she doing very well with her left foot. Has some venous stasis chronic changes and discoloration to the mid tibial region diagnosis chronic. The medial ankle is very stable appears to be healed but still needs padded protection. Recommend continued with the same dressing minutes on there now. Fifth toe also has a very stable appearance with no evidence of infection, drainage or problems healing. Plan: Recommend continue with  the padded dressing on the medial left ankle. Patient can leave the dressing on the left foot for 4 or 5 days before itgets changed in similar fashion with 4 x 4 gauze pads, Kerlix ,and a loosely applied Ace wrap. Should come back to see Dr. Ether Griffins in the office next week. Need sutures removed at that time. Call if any problems  arise.  Principal Problem:   CVA (cerebral infarction) Active Problems:   End stage renal disease (HCC)   Pressure ulcer   Diabetes mellitus (HCC)   Diabetic neuropathy (HCC)   Chronic atrial fibrillation (HCC)   Left carotid artery stenosis     Family Communication: Plan discussed with patient .  Epimenio Sarin M.D on 06/24/2015 at 12:45 PM  Thank you for the consult, we will follow the patient with you in the Hospital.

## 2015-06-24 NOTE — Progress Notes (Signed)
[facility]  ROUNDING NOTE   Subjective:   Hemodialysis on Saturday. Tolerated treatment well.  Daughter at bedside. Patient working with PT this morning. Moving her right side more.   Holding Eliquis  Objective:  Vital signs in last 24 hours:  Temp:  [97.7 F (36.5 C)-98.6 F (37 C)] 97.7 F (36.5 C) (01/23 0949) Pulse Rate:  [66-96] 87 (01/23 0949) Resp:  [16-18] 16 (01/23 0949) BP: (108-124)/(51-55) 124/51 mmHg (01/23 0949) SpO2:  [93 %-100 %] 98 % (01/23 0949)  Weight change:  Filed Weights   06/18/15 1731 06/18/15 2332 06/22/15 1120  Weight: 72.576 kg (160 lb) 73.619 kg (162 lb 4.8 oz) 77.2 kg (170 lb 3.1 oz)    Intake/Output: I/O last 3 completed shifts: In: 840 [P.O.:840] Out: 550 [Urine:550]   Intake/Output this shift:  Total I/O In: 480 [P.O.:480] Out: -   Physical Exam: General: NAD, resting in bed  Head: Normocephalic, atraumatic. Moist oral mucosal membranes  Eyes: Anicteric  Neck: Supple, trachea midline  Lungs:  Clear to auscultation normal effort  Heart: irregular  Abdomen:  Soft, nontender, BS present  Extremities:  no peripheral edema.  Neurologic: RUE weakness noted.   Skin: Erythema overlying access, appears as a rash, not warm  Access: RUE AVF    Basic Metabolic Panel:  Recent Labs Lab 06/18/15 1804 06/20/15 1016 06/22/15 0552  NA 135  --  137  K 3.2*  --  3.7  CL 97*  --  98*  CO2 25  --  29  GLUCOSE 197*  --  142*  BUN 26*  --  38*  CREATININE 3.15*  --  4.47*  CALCIUM 8.2*  --  8.7*  PHOS  --  6.3*  --     Liver Function Tests:  Recent Labs Lab 06/18/15 1804  AST 20  ALT 8*  ALKPHOS 70  BILITOT 0.6  PROT 8.1  ALBUMIN 3.3*   No results for input(s): LIPASE, AMYLASE in the last 168 hours. No results for input(s): AMMONIA in the last 168 hours.  CBC:  Recent Labs Lab 06/18/15 1804 06/21/15 0612 06/22/15 0552 06/24/15 0414  WBC 8.1 11.3* 11.4* 10.7  NEUTROABS 6.4  --   --   --   HGB 11.6*  10.7* 10.0* 10.6*  HCT 34.7* 32.6* 30.7* 31.5*  MCV 96.1 95.0 95.4 95.5  PLT 269 249 248 254    Cardiac Enzymes: No results for input(s): CKTOTAL, CKMB, CKMBINDEX, TROPONINI in the last 168 hours.  BNP: Invalid input(s): POCBNP  CBG:  Recent Labs Lab 06/23/15 1112 06/23/15 1623 06/23/15 2154 06/24/15 0723 06/24/15 1114  GLUCAP 169* 171* 191* 146* 169*    Microbiology: Results for orders placed or performed during the hospital encounter of 06/07/15  Wound culture     Status: None   Collection Time: 06/07/15  1:44 PM  Result Value Ref Range Status   Specimen Description ORTHO B9  Final   Special Requests NONE  Final   Gram Stain RARE WBC SEEN NO ORGANISMS SEEN   Final   Culture   Final    LIGHT GROWTH PROTEUS MIRABILIS LIGHT GROWTH STENOTROPHOMONAS MALTOPHILIA    Report Status 06/14/2015 FINAL  Final   Organism ID, Bacteria PROTEUS MIRABILIS  Final   Organism ID, Bacteria STENOTROPHOMONAS MALTOPHILIA  Final      Susceptibility   Proteus mirabilis - MIC*    AMPICILLIN >=32 RESISTANT Resistant     CEFTAZIDIME <=1 SENSITIVE Sensitive     CEFAZOLIN <=4  SENSITIVE Sensitive     CEFTRIAXONE <=1 SENSITIVE Sensitive     CIPROFLOXACIN >=4 RESISTANT Resistant     GENTAMICIN <=1 SENSITIVE Sensitive     IMIPENEM 2 SENSITIVE Sensitive     TRIMETH/SULFA >=320 RESISTANT Resistant     PIP/TAZO Value in next row Sensitive      SENSITIVE<=4    ERTAPENEM Value in next row Sensitive      SENSITIVE<=0.5    * LIGHT GROWTH PROTEUS MIRABILIS   Stenotrophomonas maltophilia - MIC*    LEVOFLOXACIN Value in next row Sensitive      SENSITIVE<=0.5    TRIMETH/SULFA Value in next row Sensitive      SENSITIVE<=0.5    * LIGHT GROWTH STENOTROPHOMONAS MALTOPHILIA    Coagulation Studies:  Recent Labs  06/23/15 1030 06/24/15 0414  LABPROT 14.0 14.6  INR 1.06 1.12    Urinalysis: No results for input(s): COLORURINE, LABSPEC, PHURINE, GLUCOSEU, HGBUR, BILIRUBINUR, KETONESUR, PROTEINUR,  UROBILINOGEN, NITRITE, LEUKOCYTESUR in the last 72 hours.  Invalid input(s): APPERANCEUR    Imaging: No results found.   Medications:     . aspirin  81 mg Oral Daily  . atorvastatin  40 mg Oral q1800  . calcium acetate (Phos Binder)  1,334 mg Oral TID WC  . diltiazem  120 mg Oral Daily  . famotidine  20 mg Oral Daily  . furosemide  80 mg Oral BID  . gabapentin  100 mg Oral Once per day on Sun Mon Wed Fri  . gabapentin  300 mg Oral Once per day on Tue Thu Sat  . insulin aspart  0-5 Units Subcutaneous QHS  . insulin aspart  0-9 Units Subcutaneous TID WC  . multivitamin  1 tablet Oral Daily  . PARoxetine  40 mg Oral QHS  . warfarin  10 mg Oral q1800  . Warfarin - Pharmacist Dosing Inpatient   Does not apply q1800   sodium chloride, sodium chloride, acetaminophen, alteplase, diphenhydrAMINE, heparin, HYDROcodone-acetaminophen, lidocaine (PF), lidocaine-prilocaine, pentafluoroprop-tetrafluoroeth, traMADol  Assessment/ Plan:  82 y.o. female with past medical history of atrial fibrillation on Coumadin, hypertension, gout, insulin-dependent diabetes mellitus type 2, diabetic neuropathy, CVA with right visual field blindness, hyperlipidemia, major depressive disorder, overactive bladder with incontinence, generalized anxiety disorder, congestive heart failure diastolic, parathyroidectomy, and osteoarthritis admitted with RUE weakness.    [facility] [employer].   1.  ESRD on HD TTHS:  Hemodialysis for tomorrow.   2.  Anemia chronic kidney disease.  hgb 10.6 - hold epo due to recent ischemic event.   3.  Secondary hyperparathyroidism. Phos elevated at 6.3. Outpatient is not much better. PTH 279. - Continue phoslyra   4. Hypertension: will let patient's blood pressure ride with recent CVA and now with post ischemic hemorrhage. - diltiazem for rate control.      LOS:  Lamont Dowdy 1/23/20173:23 PM

## 2015-06-24 NOTE — Progress Notes (Signed)
Patient discharging to [facility]. VSS. Report called to Selena Batten at facility. Will call for transport.

## 2015-06-24 NOTE — Care Management Important Message (Signed)
Important Message  Patient Details  Name: [name] MRN: [id number] Date of Birth: 01/02/1934   Medicare Important Message Given:  Yes    [name] 06/24/2015, 1:52 PM

## 2015-06-24 NOTE — Discharge Summary (Signed)
[facility] - [place] at [facility]   PATIENT NAME: [name]    MR#:  [id number]  DATE OF BIRTH:  02-Jan-1934  DATE OF ADMISSION:  06/18/2015 ADMITTING PHYSICIAN: Ramonita Lab, MD  DATE OF DISCHARGE: No discharge date for patient encounter.  PRIMARY CARE PHYSICIAN: [name], MD     ADMISSION DIAGNOSIS:  Right arm numbness [R20.2] Right arm weakness [R29.898]  DISCHARGE DIAGNOSIS:  Principal Problem:   CVA (cerebral infarction) Active Problems:   Chronic atrial fibrillation (HCC)   Left carotid artery stenosis   End stage renal disease (HCC)   Pressure ulcer   Diabetes mellitus (HCC)   Diabetic neuropathy (HCC)   Amputation of fifth toe, left, traumatic (HCC)   SECONDARY DIAGNOSIS:   Past Medical History  Diagnosis Date  . Chronic kidney disease   . Dysrhythmia   . Diabetes mellitus without complication (HCC)   . Hypertension   . Stroke (HCC)   . Arthritis     gout  . Dialysis patient (HCC)   . Pleural effusion   . Pulmonary hypertension (HCC)   . Renal insufficiency     .pro HOSPITAL COURSE:   The patient is 82 year old Caucasian female with past medical history, significant for the History of recent left fifth toe amputation due to osteomyelitis, diabetes, end-stage renal disease, pulmonary hypertension, stroke, who presents to the hospital with complaints of right upper extremity weakness which happened after hemodialysis on the day of admission. Right arm felt numb and weak. Right hand grip strength was also decreased. Patient denied any dysphagia or dysarthria blurry vision. Initial CT scan was unremarkable. As patient complained of some neck pain. CT angiogram of the neck was performed. It revealed no evidence of large vessel occlusion of significant intracranial arterial stenosis 65% stenosis of the distal left common carotid artery was noted and the high-grade stenosis versus short segment occlusion of the left external  carotid artery at its origin. MRI of brain was performed and showed a moderate size acute minimal hemorrhagic left parietal lobe infarct, small acute minimal hemorrhagic right parietal occipital lobe infarct, questionable tiny acute left midbrain infarct, remote large posterior left temporal lobe infarct with encephalomalacia. Remote parietal lobe infarcts with encephalomalacia, more notable on the right, remote small centrum semiovale infarct. Global atrophy without hydrocephalus was noted also polypoid complex opacification in the right sphenoid sinus. Patient was seen by neurologist, as well as vascular surgeon while in the hospital. Vascular surgeon felt that patient had critical stenosis of left common carotid in association with CVA. He recommended to plan. Carotid stenting, but given hemorrhagic complement. He recommended to wait 4-6 weeks for this procedure. In regards to end-stage renal disease and AV dialysis access, he recommended no further treatment or interventions regarding right arm brachial axillary graft, since patient tolerated dialysis in the hospital with no problems and there was no evidence of infection. Neurologist who saw patient in consultation . Recommended to continue aspirin therapy and Coumadin, no Eliquis or Xarelto was recommended due to end-stage renal disease. She was initiated on Coumadin therapy and her INR  is 1.12 today, it is recommended to follow patient's pro time INR daily to each patient's INR level of 2.0 and above.  Patient was seen by Dr. Recardo Evangelist for left fifth toe amputation, who felt that patient's wound area looks good and healing, he recommended to follow-up with Dr. Ether Griffins in about 1 week after discharge Patient was felt to be stable to be discharged to skilled  nursing facility today Discussion by problem #1 acute likely cardioembolic CVA-Patient's CT head was initially negative for any acute pathology, repeated head CT January 21 showed no significant  hemorrhagic involvement. Patient CTA of the head and neck shows no significant intracranial stenosis but critical stenosis in the distal common carotid artery on the left. Patient was advised to continue ASA, Coumadin to reach INR level of 2.0 and above , appreciate neurology, vascular surgery inputs. - PT/OT eval noted and Social Work working on [employer] placement, likely today. Patient is  initiated on Coumadin with no bridging due to mild hemorrhagic transformation of her stroke on brain MRI. Follow patient's pro time INR closely.   #2 ESRD on HD - cont. Care as per Nephro.  - dialysis on Tu, Thurs, Sat. Pt is having HD per schedule   #3. DM type II w/out complication - cont. SSI - BS stable.   #4. Diabetic neuropathy-continue gabapentin.  #5 history of chronic afibrillation-rate controlled. Continue Cardizem. - Off Elquis due to minimal hemorrhagic conversion of acute stroke and end-stage liver disease. Patient is getting to be started on Coumadin therapy despite the difficulties with Coumadin regulation due to end-stage organ disease and inability to initiate Xarelto.   #6. Anxiety - cont. Paxil.   #7 GERD - cont. Pepcid.   #8 , left common Carotid Artery Stenosis - pt. Has > 80 percent stenosis on left distal common carotid.  - appreciate vascular surgery input, recommended procedure and about 4-6 weeks after the onset of the stroke  #9 status post recent left fifth toe amputation for osteomyelitis by Dr. Ether Griffins, patient was seen by Dr. Orland Jarred and felt that the she was healing well. Patient is to follow-up with Dr. Ether Griffins in approximately one week after discharge. She was recommended to have wound dressing changes as follow: Continue with padded dressing on the medial left ankle. Patient can leave the dressing on the left foot for 4-5 days before it gets changed in similar fashion with 4 x 4 gauze pads, Kerlix, loosely applied Ace wrap. Patient is to see Dr. Ether Griffins in about 1 week to have  sutures removed at that time.  DISCHARGE CONDITIONS:   Stable  CONSULTS OBTAINED:  Treatment Team:  Thana Farr, MD Renford Dills, MD [place] Cherylann Ratel, MD Gwyneth Revels, DPM Recardo Evangelist, DPM  DRUG ALLERGIES:   Allergies  Allergen Reactions  . Codeine Other (See Comments)    Reaction:  Confusion/Hallucinations   . Oxycodone Other (See Comments)    Reaction:  Confusion/Hallucinations   . Quinine Derivatives Rash    DISCHARGE MEDICATIONS:   Current Discharge Medication List    START taking these medications   Details  aspirin 81 MG chewable tablet Chew 1 tablet (81 mg total) by mouth daily. Qty: 30 tablet, Refills: 6    atorvastatin (LIPITOR) 40 MG tablet Take 1 tablet (40 mg total) by mouth daily at 6 PM. Qty: 30 tablet, Refills: 6    diphenhydrAMINE (BENADRYL) 25 mg capsule Take 1 capsule (25 mg total) by mouth every 6 (six) hours as needed (rash, itching). Qty: 30 capsule, Refills: 0    insulin aspart (NOVOLOG) 100 UNIT/ML injection Inject 0-9 Units into the skin 3 (three) times daily with meals. Qty: 10 mL, Refills: 11    traMADol (ULTRAM) 50 MG tablet Take 1 tablet (50 mg total) by mouth every 12 (twelve) hours as needed for moderate pain. Qty: 30 tablet, Refills: 0    warfarin (COUMADIN) 2 MG tablet Take 1  tablet (2 mg total) by mouth daily. Qty: 30 tablet, Refills: 6      CONTINUE these medications which have NOT CHANGED   Details  acetaminophen (TYLENOL) 500 MG tablet Take 1,000 mg by mouth 2 (two) times daily.     Biotin 1 MG CAPS Take 1 mg by mouth daily.     bisacodyl (DULCOLAX) 5 MG EC tablet Take 10 mg by mouth daily as needed for moderate constipation.     calcium acetate, Phos Binder, (PHOSLYRA) 667 MG/5ML SOLN Take 1,334 mg by mouth 3 (three) times daily with meals.    diltiazem (TIAZAC) 120 MG 24 hr capsule Take 120 mg by mouth daily.    folic acid-vitamin b complex-vitamin c-selenium-zinc (DIALYVITE) 3 MG TABS tablet Take 1 tablet  by mouth daily.    furosemide (LASIX) 80 MG tablet Take 80 mg by mouth 2 (two) times daily.    !! gabapentin (NEURONTIN) 100 MG capsule Take 100 mg by mouth at bedtime. Pt takes on Sunday, Monday, Wednesday, and Friday.    !! gabapentin (NEURONTIN) 300 MG capsule Take 300 mg by mouth at bedtime. Pt takes on Tuesday, Thursday, and Saturday.    Insulin Detemir (LEVEMIR) 100 UNIT/ML Pen Inject 10-20 Units into the skin 2 (two) times daily. Pt uses based on her blood sugar.    lidocaine-prilocaine (EMLA) cream Apply 1 application topically as needed (prior to accessing port).     Omega-3 Fatty Acids (FISH OIL) 1200 MG CAPS Take 1,200 mg by mouth daily.     PARoxetine (PAXIL) 40 MG tablet Take 40 mg by mouth at bedtime.     ranitidine (ZANTAC) 150 MG tablet Take 150 mg by mouth at bedtime.     !! - Potential duplicate medications found. Please discuss with provider.    STOP taking these medications     ELIQUIS 2.5 MG TABS tablet          DISCHARGE INSTRUCTIONS:    Patient is to follow-up with primary care physician, podiatrist, Dr. Ether Griffins, vascular surgery, neurology as outpatient. Please follow patient's pro time INR closely as she had the problems with Coumadin levels in the past  If you experience worsening of your admission symptoms, develop shortness of breath, life threatening emergency, suicidal or homicidal thoughts you must seek medical attention immediately by calling 911 or calling your MD immediately  if symptoms less severe.  You Must read complete instructions/literature along with all the possible adverse reactions/side effects for all the Medicines you take and that have been prescribed to you. Take any new Medicines after you have completely understood and accept all the possible adverse reactions/side effects.   Please note  You were cared for by a hospitalist during your hospital stay. If you have any questions about your discharge medications or the care you  received while you were in the hospital after you are discharged, you can call the unit and asked to speak with the hospitalist on call if the hospitalist that took care of you is not available. Once you are discharged, your primary care physician will handle any further medical issues. Please note that NO REFILLS for any discharge medications will be authorized once you are discharged, as it is imperative that you return to your primary care physician (or establish a relationship with a primary care physician if you do not have one) for your aftercare needs so that they can reassess your need for medications and monitor your lab values.    Today  CHIEF COMPLAINT:   Chief Complaint  Patient presents with  . arm numbness     HISTORY OF PRESENT ILLNESS:  [name]  is a 82 y.o. female with a known history of recent left fifth toe amputation due to osteomyelitis, diabetes, end-stage renal disease, pulmonary hypertension, stroke, who presents to the hospital with complaints of right upper extremity weakness which happened after hemodialysis on the day of admission. Right arm felt numb and weak. Right hand grip strength was also decreased. Patient denied any dysphagia or dysarthria blurry vision. Initial CT scan was unremarkable. As patient complained of some neck pain. CT angiogram of the neck was performed. It revealed no evidence of large vessel occlusion of significant intracranial arterial stenosis 65% stenosis of the distal left common carotid artery was noted and the high-grade stenosis versus short segment occlusion of the left external carotid artery at its origin. MRI of brain was performed and showed a moderate size acute minimal hemorrhagic left parietal lobe infarct, small acute minimal hemorrhagic right parietal occipital lobe infarct, questionable tiny acute left midbrain infarct, remote large posterior left temporal lobe infarct with encephalomalacia. Remote parietal lobe infarcts with  encephalomalacia, more notable on the right, remote small centrum semiovale infarct. Global atrophy without hydrocephalus was noted also polypoid complex opacification in the right sphenoid sinus. Patient was seen by neurologist, as well as vascular surgeon while in the hospital. Vascular surgeon felt that patient had critical stenosis of left common carotid in association with CVA. He recommended to plan. Carotid stenting, but given hemorrhagic complement. He recommended to wait 4-6 weeks for this procedure. In regards to end-stage renal disease and AV dialysis access, he recommended no further treatment or interventions regarding right arm brachial axillary graft, since patient tolerated dialysis in the hospital with no problems and there was no evidence of infection. Neurologist who saw patient in consultation . Recommended to continue aspirin therapy and Coumadin, no Eliquis or Xarelto was recommended due to end-stage renal disease. She was initiated on Coumadin therapy and her INR  is 1.12 today, it is recommended to follow patient's pro time INR daily to each patient's INR level of 2.0 and above.  Patient was seen by Dr. Recardo Evangelist for left fifth toe amputation, who felt that patient's wound area looks good and healing, he recommended to follow-up with Dr. Ether Griffins in about 1 week after discharge Patient was felt to be stable to be discharged to skilled nursing facility today Discussion by problem #1 acute likely cardioembolic CVA-Patient's CT head was initially negative for any acute pathology, repeated head CT January 21 showed no significant hemorrhagic involvement. Patient CTA of the head and neck shows no significant intracranial stenosis but critical stenosis in the distal common carotid artery on the left. Patient was advised to continue ASA, Coumadin to reach INR level of 2.0 and above , appreciate neurology, vascular surgery inputs. - PT/OT eval noted and Social Work working on [employer] placement,  likely today. Patient is  initiated on Coumadin with no bridging due to mild hemorrhagic transformation of her stroke on brain MRI. Follow patient's pro time INR closely.   #2 ESRD on HD - cont. Care as per Nephro.  - dialysis on Tu, Thurs, Sat. Pt is having HD per schedule   #3. DM type II w/out complication - cont. SSI - BS stable.   #4. Diabetic neuropathy-continue gabapentin.  #5 history of chronic afibrillation-rate controlled. Continue Cardizem. - Off Elquis due to minimal hemorrhagic conversion of acute stroke and  end-stage liver disease. Patient is getting to be started on Coumadin therapy despite the difficulties with Coumadin regulation due to end-stage organ disease and inability to initiate Xarelto.   #6. Anxiety - cont. Paxil.   #7 GERD - cont. Pepcid.   #8 , left common Carotid Artery Stenosis - pt. Has > 80 percent stenosis on left distal common carotid.  - appreciate vascular surgery input, recommended procedure and about 4-6 weeks after the onset of the stroke  #9 status post recent left fifth toe amputation for osteomyelitis by Dr. Ether Griffins, patient was seen by Dr. Orland Jarred and felt that the she was healing well. Patient is to follow-up with Dr. Ether Griffins in approximately one week after discharge. She was recommended to have wound dressing changes as follow: Continue with padded dressing on the medial left ankle. Patient can leave the dressing on the left foot for 4-5 days before it gets changed in similar fashion with 4 x 4 gauze pads, Kerlix, loosely applied Ace wrap. Patient is to see Dr. Ether Griffins in about 1 week to have sutures removed at that time.  DISCHARGE CONDITIONS:     VITAL SIGNS:  Blood pressure 124/51, pulse 87, temperature 97.7 F (36.5 C), temperature source Oral, resp. rate 16, height 5\' 7"  (1.702 m), weight 77.2 kg (170 lb 3.1 oz), SpO2 98 %.  I/O:   Intake/Output Summary (Last 24 hours) at 06/24/15 1459 Last data filed at 06/24/15 1200  Gross per 24  hour  Intake    720 ml  Output      0 ml  Net    720 ml    PHYSICAL EXAMINATION:  GENERAL:  82 y.o.-year-old patient lying in the bed with no acute distress.  EYES: Pupils equal, round, reactive to light and accommodation. No scleral icterus. Extraocular muscles intact.  HEENT: Head atraumatic, normocephalic. Oropharynx and nasopharynx clear.  NECK:  Supple, no jugular venous distention. No thyroid enlargement, no tenderness.  LUNGS: Normal breath sounds bilaterally, no wheezing, rales,rhonchi or crepitation. No use of accessory muscles of respiration.  CARDIOVASCULAR: S1, S2 , regularly irregular. No murmurs, rubs, or gallops.  ABDOMEN: Soft, non-tender, non-distended. Bowel sounds present. No organomegaly or mass.  EXTREMITIES: No pedal edema, cyanosis, or clubbing.  NEUROLOGIC: Cranial nerves II through XII are intact. Muscle strength 5/5 in all extremities. Sensation intact. Gait not checked.  PSYCHIATRIC: The patient is alert and oriented x 3.  SKIN: No obvious rash, lesion, or ulcer.   DATA REVIEW:   CBC  Recent Labs Lab 06/24/15 0414  WBC 10.7  HGB 10.6*  HCT 31.5*  PLT 254    Chemistries   Recent Labs Lab 06/18/15 1804 06/22/15 0552  NA 135 137  K 3.2* 3.7  CL 97* 98*  CO2 25 29  GLUCOSE 197* 142*  BUN 26* 38*  CREATININE 3.15* 4.47*  CALCIUM 8.2* 8.7*  AST 20  --   ALT 8*  --   ALKPHOS 70  --   BILITOT 0.6  --     Cardiac Enzymes No results for input(s): TROPONINI in the last 168 hours.  Microbiology Results  Results for orders placed or performed during the hospital encounter of 06/07/15  Wound culture     Status: None   Collection Time: 06/07/15  1:44 PM  Result Value Ref Range Status   Specimen Description ORTHO B9  Final   Special Requests NONE  Final   Gram Stain RARE WBC SEEN NO ORGANISMS SEEN   Final   Culture  Final    LIGHT GROWTH PROTEUS MIRABILIS LIGHT GROWTH STENOTROPHOMONAS MALTOPHILIA    Report Status 06/14/2015 FINAL  Final    Organism ID, Bacteria PROTEUS MIRABILIS  Final   Organism ID, Bacteria STENOTROPHOMONAS MALTOPHILIA  Final      Susceptibility   Proteus mirabilis - MIC*    AMPICILLIN >=32 RESISTANT Resistant     CEFTAZIDIME <=1 SENSITIVE Sensitive     CEFAZOLIN <=4 SENSITIVE Sensitive     CEFTRIAXONE <=1 SENSITIVE Sensitive     CIPROFLOXACIN >=4 RESISTANT Resistant     GENTAMICIN <=1 SENSITIVE Sensitive     IMIPENEM 2 SENSITIVE Sensitive     TRIMETH/SULFA >=320 RESISTANT Resistant     PIP/TAZO Value in next row Sensitive      SENSITIVE<=4    ERTAPENEM Value in next row Sensitive      SENSITIVE<=0.5    * LIGHT GROWTH PROTEUS MIRABILIS   Stenotrophomonas maltophilia - MIC*    LEVOFLOXACIN Value in next row Sensitive      SENSITIVE<=0.5    TRIMETH/SULFA Value in next row Sensitive      SENSITIVE<=0.5    * LIGHT GROWTH STENOTROPHOMONAS MALTOPHILIA    RADIOLOGY:  No results found.  EKG:   Orders placed or performed during the hospital encounter of 06/18/15  . ED EKG  . ED EKG  . EKG 12-Lead  . EKG 12-Lead      Management plans discussed with the patient, family and they are in agreement.  CODE STATUS:     Code Status Orders        Start     Ordered   06/18/15 2342  Do not attempt resuscitation (DNR)   Continuous    Question Answer Comment  In the event of cardiac or respiratory ARREST Do not call a "code blue"   In the event of cardiac or respiratory ARREST Do not perform Intubation, CPR, defibrillation or ACLS   In the event of cardiac or respiratory ARREST Use medication by any route, position, wound care, and other measures to relive pain and suffering. May use oxygen, suction and manual treatment of airway obstruction as needed for comfort.   Comments RN may pronounce      06/18/15 2341    Code Status History    Date Active Date Inactive Code Status Order ID Comments User Context   06/07/2015  3:46 PM 06/07/2015  6:48 PM Full Code [id number]  Gwyneth Revels, DPM Inpatient    12/11/2014 10:46 PM 12/13/2014  9:38 PM DNR [id number]  Enedina Finner, MD Inpatient   12/11/2014  9:10 PM 12/11/2014 10:46 PM DNR [id number]  Enedina Finner, MD ED    Advance Directive Documentation        Most Recent Value   Type of Advance Directive  Healthcare Power of Attorney, Living will   Pre-existing out of facility DNR order (yellow form or pink MOST form)     "MOST" Form in Place?        TOTAL TIME TAKING CARE OF THIS PATIENT: 40 minutes.    Katharina Caper M.D on 06/24/2015 at 2:59 PM  Between 7am to 6pm - Pager - [phone]  After 6pm go to [email or web address] - password [email or web address] [facility]  [place] [facility]  Office  [phone]  CC: Primary care physician; [name], MD

## 2015-06-24 NOTE — Clinical Social Work Note (Signed)
Pt is ready for discharge today to [facility]. Facility has received discharge summary and is ready to admit pt. Pt and family are aware and in agreement. Pt has had a 3 night inpatient qualifying stay for Medicare. RN called report and EMS will provide transportation. CSW is signing off as no further needs identified.   Dede Query, MSW, LCSW Clinical Social Worker  [phone]

## 2015-06-24 NOTE — Progress Notes (Signed)
Physical Therapy Treatment Patient Details Name: [name] MRN: [id number] DOB: January 01, 1934 Today's Date: 06/24/2015    History of Present Illness presented to ER secondary to R UE weakness/numbness after dialysis session previous date (reports significant hypontensive episode during dialysis); admitted for TIA vs. CVA work up.  CT significant for chronic infarcts (L post parietal/occipital lobe, R parietal), but no acute changes.  CTA reveals no large vessel occlusion, but significant high-grade stenosis L external carotid.  MRI significant for bilat infarcts with mild associated hemorrhage (stable)    PT Comments    Significant improvement in midline orientation and standing balance when R shoe donned (with L offloading shoe) during all mobility efforts.  Min cuing from therapist for sustained R TKE with prolonged standing activities, but able to initiate on command. Patient very motivated; excellent effort and participation.   Follow Up Recommendations  SNF     Equipment Recommendations       Recommendations for Other Services       Precautions / Restrictions Precautions Precautions: Fall Precaution Comments: R AVF Restrictions Weight Bearing Restrictions: No Other Position/Activity Restrictions: history of recent L 5th toe amputation (approx 2 weeks prior); per patient/son, to wear post-op shoe with all WBing, encouraged for WBing through L heel as able    Mobility  Bed Mobility Overal bed mobility: Needs Assistance Bed Mobility: Supine to Sit     Supine to sit: Min assist     General bed mobility comments: transition towards L; increased time and heavy use of bedrails to complete  Transfers Overall transfer level: Needs assistance     Sit to Stand: Min assist Stand pivot transfers: Mod assist       General transfer comment: improved midline orientation with use of shoe to R LE (for stance symmetry once L offloading shoe donned); min cuing for sustained R  TKE in closed-chain position  Ambulation/Gait                 Stairs            Wheelchair Mobility    Modified Rankin (Stroke Patients Only)       Balance Overall balance assessment: Needs assistance Sitting-balance support: No upper extremity supported;Feet supported Sitting balance-Leahy Scale: Fair     Standing balance support: Bilateral upper extremity supported Standing balance-Leahy Scale: Fair Standing balance comment: bilat UEs on elevated hospital table anterior to patient                    Cognition Arousal/Alertness: Awake/alert Behavior During Therapy: WFL for tasks assessed/performed Overall Cognitive Status: Within Functional Limits for tasks assessed                      Exercises Other Exercises Other Exercises: Improving active use and awareness of R UE position noted: 3-/5 at shoulder, elbow, wrist and hand during session this date. More active, spotnaneous movement than appreciated during previous session.  Mild shoulder subluxation; reinforced need for awareness of position and 'propping' as able to prevent further joint damage Other Exercises: Sit/stand without assist device, min/mod assist, requires UE support to complete. Static stance at elevated hospital table (anterior to patient) with bilat UEs supported, performed R UE functional reaching, grasp/release (to negotiate bottles).  Frequent verbal cuing for initiation/ability to sustain R TKE with divided attention or fatigue, but able to actively initiate when cued. Other Exercises: Toilet transfer, SPT without assist device, min/mod assist.  Improved weight shifting and dynamic balance  with R shoe donned during all mobility efforts.  Able to actively advance and stabilize R LE without external assist from therapist    General Comments        Pertinent Vitals/Pain Faces Pain Scale: Hurts little more Pain Location: R shoulder Pain Descriptors / Indicators:  Aching;Grimacing Pain Intervention(s): Limited activity within patient's tolerance;Monitored during session;Repositioned    Home Living                      Prior Function            PT Goals (current goals can now be found in the care plan section) Acute Rehab PT Goals Patient Stated Goal: "to get stronger and be able to do more by myself" PT Goal Formulation: With patient Time For Goal Achievement: 07/03/15 Potential to Achieve Goals: Good Progress towards PT goals: Progressing toward goals    Frequency  7X/week    PT Plan Current plan remains appropriate    Co-evaluation             End of Session Equipment Utilized During Treatment: Gait belt Activity Tolerance: Patient tolerated treatment well Patient left: in chair;with call bell/phone within reach;with chair alarm set;with family/visitor present     Time: 9604-5409 PT Time Calculation (min) (ACUTE ONLY): 38 min  Charges:  $Therapeutic Activity: 8-22 mins $Neuromuscular Re-education: 23-37 mins                    G Codes:       Krisandra Bueno H. Manson Passey, PT, DPT, NCS 06/24/2015, 1:47 PM [phone]

## 2015-07-08 ENCOUNTER — Other Ambulatory Visit: Payer: Self-pay | Admitting: Vascular Surgery

## 2015-07-17 ENCOUNTER — Other Ambulatory Visit: Payer: Medicare Other

## 2015-07-19 ENCOUNTER — Encounter
Admission: RE | Admit: 2015-07-19 | Discharge: 2015-07-19 | Disposition: A | Discharge status: Home / Self Care | Payer: Medicare (Managed Care) | Source: Ambulatory Visit | Attending: Vascular Surgery | Admitting: Vascular Surgery

## 2015-07-19 DIAGNOSIS — Z01812 Encounter for preprocedural laboratory examination: Secondary | ICD-10-CM | POA: Diagnosis present

## 2015-07-19 DIAGNOSIS — I10 Essential (primary) hypertension: Secondary | ICD-10-CM | POA: Diagnosis not present

## 2015-07-19 DIAGNOSIS — E785 Hyperlipidemia, unspecified: Secondary | ICD-10-CM | POA: Diagnosis not present

## 2015-07-19 DIAGNOSIS — N186 End stage renal disease: Secondary | ICD-10-CM | POA: Diagnosis not present

## 2015-07-19 DIAGNOSIS — E119 Type 2 diabetes mellitus without complications: Secondary | ICD-10-CM | POA: Insufficient documentation

## 2015-07-19 HISTORY — DX: Reserved for inherently not codable concepts without codable children: IMO0001

## 2015-07-19 HISTORY — DX: Heart failure, unspecified: I50.9

## 2015-07-19 HISTORY — DX: Peripheral vascular disease, unspecified: I73.9

## 2015-07-19 HISTORY — DX: Restless legs syndrome: G25.81

## 2015-07-19 HISTORY — DX: Gastro-esophageal reflux disease without esophagitis: K21.9

## 2015-07-19 HISTORY — DX: Anemia, unspecified: D64.9

## 2015-07-19 LAB — CBC WITH DIFFERENTIAL/PLATELET
Basophils Absolute: 0.1 10*3/uL (ref 0–0.1)
Basophils Relative: 1 %
EOS ABS: 0.2 10*3/uL (ref 0–0.7)
Eosinophils Relative: 2 %
HEMATOCRIT: 33.2 % — AB (ref 35.0–47.0)
HEMOGLOBIN: 11.4 g/dL — AB (ref 12.0–16.0)
LYMPHS PCT: 9 %
Lymphs Abs: 0.8 10*3/uL — ABNORMAL LOW (ref 1.0–3.6)
MCH: 32.3 pg (ref 26.0–34.0)
MCHC: 34.2 g/dL (ref 32.0–36.0)
MCV: 94.3 fL (ref 80.0–100.0)
MONO ABS: 0.9 10*3/uL (ref 0.2–0.9)
MONOS PCT: 10 %
NEUTROS ABS: 7.2 10*3/uL — AB (ref 1.4–6.5)
Neutrophils Relative %: 78 %
Platelets: 274 10*3/uL (ref 150–440)
RBC: 3.52 MIL/uL — ABNORMAL LOW (ref 3.80–5.20)
RDW: 15.1 % — AB (ref 11.5–14.5)
WBC: 9.2 10*3/uL (ref 3.6–11.0)

## 2015-07-19 LAB — BASIC METABOLIC PANEL
ANION GAP: 11 (ref 5–15)
BUN: 33 mg/dL — ABNORMAL HIGH (ref 6–20)
CO2: 29 mmol/L (ref 22–32)
Calcium: 8.7 mg/dL — ABNORMAL LOW (ref 8.9–10.3)
Chloride: 97 mmol/L — ABNORMAL LOW (ref 101–111)
Creatinine, Ser: 4.24 mg/dL — ABNORMAL HIGH (ref 0.44–1.00)
GFR calc Af Amer: 11 mL/min — ABNORMAL LOW (ref >60)
GFR calc non Af Amer: 9 mL/min — ABNORMAL LOW (ref >60)
GLUCOSE: 139 mg/dL — AB (ref 65–99)
POTASSIUM: 3.4 mmol/L — AB (ref 3.5–5.1)
Sodium: 137 mmol/L (ref 135–145)

## 2015-07-19 LAB — APTT: aPTT: 69 seconds — ABNORMAL HIGH (ref 24–36)

## 2015-07-19 LAB — TYPE AND SCREEN
ABO/RH(D): O POS
Antibody Screen: NEGATIVE
EXTEND SAMPLE REASON: TRANSFUSED

## 2015-07-19 LAB — SURGICAL PCR SCREEN
MRSA, PCR: NEGATIVE
STAPHYLOCOCCUS AUREUS: NEGATIVE

## 2015-07-19 LAB — PROTIME-INR
INR: 3.21
PROTHROMBIN TIME: 32.2 s — AB (ref 11.4–15.0)

## 2015-07-19 LAB — ABO/RH: ABO/RH(D): O POS

## 2015-07-19 NOTE — Patient Instructions (Signed)
  Your procedure is scheduled on: July 26, 2015 (Friday) Report to Day Surgery.[facility]) Second Floor To find out your arrival time please call [phone] between 1PM - 3PM on July 25, 2015 (Thursday).  Remember: Instructions that are not followed completely may result in serious medical risk, up to and including death, or upon the discretion of your surgeon and anesthesiologist your surgery may need to be rescheduled.    __x__ 1. Do not eat food or drink liquids after midnight. No gum chewing or hard candies.     ____ 2. No Alcohol for 24 hours before or after surgery.   ____ 3. Bring all medications with you on the day of surgery if instructed.    ___x_ 4. Notify your doctor if there is any change in your medical condition     (cold, fever, infections).     Do not wear jewelry, make-up, hairpins, clips or nail polish.  Do not wear lotions, powders, or perfumes. You may wear deodorant.  Do not shave 48 hours prior to surgery. Men may shave face and neck.  Do not bring valuables to the hospital.    [facility] is not responsible for any belongings or valuables.               Contacts, dentures or bridgework may not be worn into surgery.  Leave your suitcase in the car. After surgery it may be brought to your room.  For patients admitted to the hospital, discharge time is determined by your                treatment team.   Patients discharged the day of surgery will not be allowed to drive home.   Please read over the following fact sheets that you were given:   MRSA Information and Surgical Site Infection Prevention   ____ Take these medicines the morning of surgery with A SIP OF WATER:    1. Diltiazem  2.   3.   4.  5.  6.  ____ Fleet Enema (as directed)   __x__ Use CHG Soap as directed (SAGE WIPES)  ____ Use inhalers on the day of surgery  ____ Stop metformin 2 days prior to surgery    ____ Take 1/2 of usual insulin dose the night before surgery and  none on the morning of surgery.   _x___ Stop Coumadin/Plavix/aspirin on (DO NOT TAKE ASPIRIN THE DAY OF SURGERY) CALL DR. SCHNIER OFFICE AND ASK ABOUT STOPPING COUMADIN  __x__ Stop Anti-inflammatories on (NO NSAIDS) TYLENOL OK TO TAKE FOR PAIN IF NEEDED   _x__ Stop supplements until after surgery. (NOW)   ____ Bring C-Pap to the hospital.

## 2015-07-19 NOTE — Pre-Procedure Instructions (Signed)
Dr. Noralyn Pick notified of patient recent CVA in January 2017 and requested that PCP "ok patient for surgery".

## 2015-07-19 NOTE — Pre-Procedure Instructions (Signed)
Dr. Gavin Potters office notified of medical clearance request.

## 2015-07-24 NOTE — Pre-Procedure Instructions (Signed)
SEEN BY DR Gavin Potters TODAY. WAITING ON CLEARANCE TO BE FAXED

## 2015-07-25 NOTE — Pre-Procedure Instructions (Signed)
CLEARED BY DR Gavin Potters MODERATE RISK

## 2015-07-26 ENCOUNTER — Encounter: Payer: Self-pay | Admitting: *Deleted

## 2015-07-26 ENCOUNTER — Inpatient Hospital Stay: Payer: Medicare Other | Admitting: Anesthesiology

## 2015-07-26 ENCOUNTER — Encounter
Admission: RE | Disposition: A | Discharge status: Home / Self Care | Payer: Self-pay | Source: Ambulatory Visit | Attending: Vascular Surgery

## 2015-07-26 ENCOUNTER — Inpatient Hospital Stay
Admission: RE | Admit: 2015-07-26 | Discharge: 2015-07-27 | DRG: 037 | Disposition: A | Discharge status: Home / Self Care | Payer: Medicare Other | Source: Ambulatory Visit | Attending: Vascular Surgery | Admitting: Vascular Surgery

## 2015-07-26 DIAGNOSIS — I132 Hypertensive heart and chronic kidney disease with heart failure and with stage 5 chronic kidney disease, or end stage renal disease: Secondary | ICD-10-CM | POA: Diagnosis present

## 2015-07-26 DIAGNOSIS — Z7982 Long term (current) use of aspirin: Secondary | ICD-10-CM

## 2015-07-26 DIAGNOSIS — I6529 Occlusion and stenosis of unspecified carotid artery: Secondary | ICD-10-CM | POA: Diagnosis present

## 2015-07-26 DIAGNOSIS — Z8673 Personal history of transient ischemic attack (TIA), and cerebral infarction without residual deficits: Secondary | ICD-10-CM | POA: Diagnosis not present

## 2015-07-26 DIAGNOSIS — I5032 Chronic diastolic (congestive) heart failure: Secondary | ICD-10-CM | POA: Diagnosis present

## 2015-07-26 DIAGNOSIS — N186 End stage renal disease: Secondary | ICD-10-CM | POA: Diagnosis present

## 2015-07-26 DIAGNOSIS — I4891 Unspecified atrial fibrillation: Secondary | ICD-10-CM | POA: Diagnosis present

## 2015-07-26 DIAGNOSIS — N2581 Secondary hyperparathyroidism of renal origin: Secondary | ICD-10-CM | POA: Diagnosis present

## 2015-07-26 DIAGNOSIS — I739 Peripheral vascular disease, unspecified: Secondary | ICD-10-CM | POA: Diagnosis present

## 2015-07-26 DIAGNOSIS — I6522 Occlusion and stenosis of left carotid artery: Principal | ICD-10-CM | POA: Diagnosis present

## 2015-07-26 DIAGNOSIS — K219 Gastro-esophageal reflux disease without esophagitis: Secondary | ICD-10-CM | POA: Diagnosis present

## 2015-07-26 DIAGNOSIS — E1122 Type 2 diabetes mellitus with diabetic chronic kidney disease: Secondary | ICD-10-CM | POA: Diagnosis present

## 2015-07-26 DIAGNOSIS — Z794 Long term (current) use of insulin: Secondary | ICD-10-CM

## 2015-07-26 DIAGNOSIS — G2581 Restless legs syndrome: Secondary | ICD-10-CM | POA: Diagnosis present

## 2015-07-26 DIAGNOSIS — Z7901 Long term (current) use of anticoagulants: Secondary | ICD-10-CM

## 2015-07-26 DIAGNOSIS — D649 Anemia, unspecified: Secondary | ICD-10-CM | POA: Diagnosis present

## 2015-07-26 DIAGNOSIS — N3281 Overactive bladder: Secondary | ICD-10-CM | POA: Diagnosis present

## 2015-07-26 DIAGNOSIS — E785 Hyperlipidemia, unspecified: Secondary | ICD-10-CM | POA: Diagnosis present

## 2015-07-26 DIAGNOSIS — Z992 Dependence on renal dialysis: Secondary | ICD-10-CM | POA: Diagnosis not present

## 2015-07-26 DIAGNOSIS — E1140 Type 2 diabetes mellitus with diabetic neuropathy, unspecified: Secondary | ICD-10-CM | POA: Diagnosis present

## 2015-07-26 DIAGNOSIS — F411 Generalized anxiety disorder: Secondary | ICD-10-CM | POA: Diagnosis present

## 2015-07-26 HISTORY — PX: ENDARTERECTOMY: SHX5162

## 2015-07-26 LAB — POCT I-STAT 4, (NA,K, GLUC, HGB,HCT)
GLUCOSE: 127 mg/dL — AB (ref 65–99)
HCT: 35 % — ABNORMAL LOW (ref 36.0–46.0)
Hemoglobin: 11.9 g/dL — ABNORMAL LOW (ref 12.0–15.0)
Potassium: 3.6 mmol/L (ref 3.5–5.1)
Sodium: 136 mmol/L (ref 135–145)

## 2015-07-26 LAB — APTT: APTT: 41 s — AB (ref 24–36)

## 2015-07-26 LAB — TYPE AND SCREEN
ABO/RH(D): O POS
Antibody Screen: NEGATIVE

## 2015-07-26 LAB — GLUCOSE, CAPILLARY
GLUCOSE-CAPILLARY: 101 mg/dL — AB (ref 65–99)
GLUCOSE-CAPILLARY: 117 mg/dL — AB (ref 65–99)
Glucose-Capillary: 121 mg/dL — ABNORMAL HIGH (ref 65–99)

## 2015-07-26 LAB — PROTIME-INR
INR: 1.3
PROTHROMBIN TIME: 16.3 s — AB (ref 11.4–15.0)

## 2015-07-26 LAB — MRSA PCR SCREENING: MRSA by PCR: NEGATIVE

## 2015-07-26 LAB — POTASSIUM: POTASSIUM: 3.8 mmol/L (ref 3.5–5.1)

## 2015-07-26 SURGERY — ENDARTERECTOMY, CAROTID
Anesthesia: General | Site: Neck | Laterality: Left | Wound class: Clean

## 2015-07-26 MED ORDER — GABAPENTIN 100 MG PO CAPS
100.0000 mg | ORAL_CAPSULE | ORAL | Status: DC
Start: 2015-07-26 — End: 2015-07-27
  Administered 2015-07-26: 100 mg via ORAL
  Filled 2015-07-26: qty 1

## 2015-07-26 MED ORDER — MORPHINE SULFATE (PF) 2 MG/ML IV SOLN: 2.0000 mg | INTRAVENOUS | Status: DC | PRN

## 2015-07-26 MED ORDER — DIPHENHYDRAMINE HCL 25 MG PO CAPS: 25.0000 mg | ORAL_CAPSULE | Freq: Four times a day (QID) | ORAL | Status: DC | PRN

## 2015-07-26 MED ORDER — PANTOPRAZOLE SODIUM 40 MG IV SOLR: 40.0000 mg | Freq: Every day | INTRAVENOUS | Status: DC

## 2015-07-26 MED ORDER — ASPIRIN EC 81 MG PO TBEC: 81.0000 mg | DELAYED_RELEASE_TABLET | Freq: Every day | ORAL | Status: DC

## 2015-07-26 MED ORDER — SODIUM CHLORIDE 0.9 % IV SOLN
INTRAVENOUS | Status: DC | PRN
  Administered 2015-07-26: 100 mL via INTRAMUSCULAR

## 2015-07-26 MED ORDER — TRAMADOL HCL 50 MG PO TABS: 50.0000 mg | ORAL_TABLET | Freq: Four times a day (QID) | ORAL | Status: DC | PRN

## 2015-07-26 MED ORDER — ONDANSETRON HCL 4 MG/2ML IJ SOLN: 4.0000 mg | Freq: Once | INTRAMUSCULAR | Status: DC | PRN

## 2015-07-26 MED ORDER — LACTATED RINGERS IV SOLN: INTRAVENOUS | Status: DC | PRN

## 2015-07-26 MED ORDER — RENA-VITE PO TABS
1.0000 | ORAL_TABLET | Freq: Every day | ORAL | Status: DC
  Administered 2015-07-26: 1 via ORAL
  Filled 2015-07-26: qty 1

## 2015-07-26 MED ORDER — FENTANYL CITRATE (PF) 100 MCG/2ML IJ SOLN
INTRAMUSCULAR | Status: AC
  Filled 2015-07-26: qty 2

## 2015-07-26 MED ORDER — LIDOCAINE HCL (CARDIAC) 20 MG/ML IV SOLN
INTRAVENOUS | Status: DC | PRN
  Administered 2015-07-26: 60 mg via INTRAVENOUS

## 2015-07-26 MED ORDER — OMEGA-3-ACID ETHYL ESTERS 1 G PO CAPS
1.0000 g | ORAL_CAPSULE | Freq: Two times a day (BID) | ORAL | Status: DC
  Administered 2015-07-26 – 2015-07-27 (×3): 1 g via ORAL
  Filled 2015-07-26 (×3): qty 1

## 2015-07-26 MED ORDER — HEPARIN SODIUM (PORCINE) 5000 UNIT/ML IJ SOLN
INTRAMUSCULAR | Status: AC
  Filled 2015-07-26: qty 1

## 2015-07-26 MED ORDER — NITROGLYCERIN IN D5W 200-5 MCG/ML-% IV SOLN
INTRAVENOUS | Status: DC | PRN
  Administered 2015-07-26: 5 ug/min via INTRAVENOUS

## 2015-07-26 MED ORDER — FAMOTIDINE 20 MG PO TABS
20.0000 mg | ORAL_TABLET | Freq: Once | ORAL | Status: AC
  Administered 2015-07-26: 20 mg via ORAL

## 2015-07-26 MED ORDER — DOPAMINE-DEXTROSE 3.2-5 MG/ML-% IV SOLN: 3.0000 ug/kg/min | INTRAVENOUS | Status: DC

## 2015-07-26 MED ORDER — INSULIN ASPART 100 UNIT/ML ~~LOC~~ SOLN: 0.0000 [IU] | Freq: Three times a day (TID) | SUBCUTANEOUS | Status: DC

## 2015-07-26 MED ORDER — ACETAMINOPHEN 325 MG PO TABS: 325.0000 mg | ORAL_TABLET | ORAL | Status: DC | PRN

## 2015-07-26 MED ORDER — SODIUM CHLORIDE 0.9 % IV SOLN
INTRAVENOUS | Status: DC
  Administered 2015-07-26: 15:00:00 via INTRAVENOUS

## 2015-07-26 MED ORDER — SODIUM CHLORIDE 0.9 % IV SOLN
INTRAVENOUS | Status: DC
  Administered 2015-07-26 (×2): via INTRAVENOUS

## 2015-07-26 MED ORDER — DEXMEDETOMIDINE HCL 200 MCG/2ML IV SOLN
INTRAVENOUS | Status: DC | PRN
  Administered 2015-07-26: 8 ug via INTRAVENOUS

## 2015-07-26 MED ORDER — LABETALOL HCL 5 MG/ML IV SOLN
INTRAVENOUS | Status: DC | PRN
  Administered 2015-07-26 (×2): 5 mg via INTRAVENOUS

## 2015-07-26 MED ORDER — PAROXETINE HCL 20 MG PO TABS
40.0000 mg | ORAL_TABLET | Freq: Every day | ORAL | Status: DC
  Administered 2015-07-26: 40 mg via ORAL
  Filled 2015-07-26: qty 2

## 2015-07-26 MED ORDER — NEOSTIGMINE METHYLSULFATE 10 MG/10ML IV SOLN
INTRAVENOUS | Status: DC | PRN
  Administered 2015-07-26: 3.5 mg via INTRAVENOUS

## 2015-07-26 MED ORDER — GABAPENTIN 300 MG PO CAPS: 300.0000 mg | ORAL_CAPSULE | ORAL | Status: DC

## 2015-07-26 MED ORDER — CEFAZOLIN SODIUM-DEXTROSE 2-3 GM-% IV SOLR: 2.0000 g | INTRAVENOUS | Status: DC

## 2015-07-26 MED ORDER — PROPOFOL 10 MG/ML IV BOLUS
INTRAVENOUS | Status: DC | PRN
  Administered 2015-07-26: 80 mg via INTRAVENOUS

## 2015-07-26 MED ORDER — ONDANSETRON HCL 4 MG/2ML IJ SOLN
4.0000 mg | Freq: Four times a day (QID) | INTRAMUSCULAR | Status: DC | PRN
Start: 2015-07-26 — End: 2015-07-27

## 2015-07-26 MED ORDER — PHENYLEPHRINE HCL 10 MG/ML IJ SOLN
10.0000 mg | INTRAVENOUS | Status: DC | PRN
  Administered 2015-07-26: 50 ug/min via INTRAVENOUS

## 2015-07-26 MED ORDER — INSULIN DETEMIR 100 UNIT/ML ~~LOC~~ SOLN
11.0000 [IU] | Freq: Two times a day (BID) | SUBCUTANEOUS | Status: DC
  Administered 2015-07-27: 11 [IU] via SUBCUTANEOUS
  Filled 2015-07-26 (×4): qty 0.11

## 2015-07-26 MED ORDER — CEFAZOLIN SODIUM-DEXTROSE 2-3 GM-% IV SOLR
INTRAVENOUS | Status: AC
  Administered 2015-07-26: 2 g via INTRAVENOUS
  Filled 2015-07-26: qty 50

## 2015-07-26 MED ORDER — SODIUM CHLORIDE FLUSH 0.9 % IV SOLN
INTRAVENOUS | Status: AC
  Filled 2015-07-26: qty 20

## 2015-07-26 MED ORDER — HYDROMORPHONE HCL 1 MG/ML IJ SOLN
INTRAMUSCULAR | Status: DC | PRN
  Administered 2015-07-26: .4 mg via INTRAVENOUS

## 2015-07-26 MED ORDER — FAMOTIDINE 20 MG PO TABS
20.0000 mg | ORAL_TABLET | Freq: Two times a day (BID) | ORAL | Status: DC
  Administered 2015-07-26 – 2015-07-27 (×3): 20 mg via ORAL
  Filled 2015-07-26 (×3): qty 1

## 2015-07-26 MED ORDER — CALCIUM ACETATE (PHOS BINDER) 667 MG/5ML PO SOLN
1334.0000 mg | Freq: Three times a day (TID) | ORAL | Status: DC
  Administered 2015-07-27: 1334 mg via ORAL
  Filled 2015-07-26 (×6): qty 10

## 2015-07-26 MED ORDER — TRAMADOL HCL 50 MG PO TABS: 50.0000 mg | ORAL_TABLET | Freq: Two times a day (BID) | ORAL | Status: DC | PRN

## 2015-07-26 MED ORDER — SODIUM CHLORIDE 0.9 % IV SOLN: 500.0000 mL | Freq: Once | INTRAVENOUS | Status: DC | PRN

## 2015-07-26 MED ORDER — CEFAZOLIN SODIUM 1-5 GM-% IV SOLN
1.0000 g | Freq: Three times a day (TID) | INTRAVENOUS | Status: AC
  Administered 2015-07-26 – 2015-07-27 (×3): 1 g via INTRAVENOUS
  Filled 2015-07-26 (×3): qty 50

## 2015-07-26 MED ORDER — GLYCOPYRROLATE 0.2 MG/ML IJ SOLN
INTRAMUSCULAR | Status: DC | PRN
  Administered 2015-07-26: .6 mg via INTRAVENOUS

## 2015-07-26 MED ORDER — BISACODYL 5 MG PO TBEC: 10.0000 mg | DELAYED_RELEASE_TABLET | Freq: Every day | ORAL | Status: DC | PRN

## 2015-07-26 MED ORDER — ACETAMINOPHEN 325 MG RE SUPP: 325.0000 mg | RECTAL | Status: DC | PRN

## 2015-07-26 MED ORDER — DIALYVITE 3000 3 MG PO TABS: 1.0000 | ORAL_TABLET | Freq: Every day | ORAL | Status: DC

## 2015-07-26 MED ORDER — FENTANYL CITRATE (PF) 100 MCG/2ML IJ SOLN
INTRAMUSCULAR | Status: DC | PRN
  Administered 2015-07-26 (×2): 50 ug via INTRAVENOUS

## 2015-07-26 MED ORDER — LIDOCAINE HCL (PF) 1 % IJ SOLN
INTRAMUSCULAR | Status: AC
  Filled 2015-07-26: qty 30

## 2015-07-26 MED ORDER — CEFAZOLIN (ANCEF) 1 G IV SOLR: 1.0000 g | Freq: Three times a day (TID) | INTRAVENOUS | Status: DC

## 2015-07-26 MED ORDER — WARFARIN SODIUM 1 MG PO TABS: 1.5000 mg | ORAL_TABLET | Freq: Every day | ORAL | Status: DC

## 2015-07-26 MED ORDER — METOPROLOL TARTRATE 1 MG/ML IV SOLN: 2.0000 mg | INTRAVENOUS | Status: DC | PRN

## 2015-07-26 MED ORDER — FAMOTIDINE 20 MG PO TABS
ORAL_TABLET | ORAL | Status: AC
  Administered 2015-07-26: 20 mg via ORAL
  Filled 2015-07-26: qty 1

## 2015-07-26 MED ORDER — HEPARIN SODIUM (PORCINE) 1000 UNIT/ML IJ SOLN
INTRAMUSCULAR | Status: DC | PRN
  Administered 2015-07-26: 7000 [IU] via INTRAVENOUS

## 2015-07-26 MED ORDER — DOCUSATE SODIUM 100 MG PO CAPS
100.0000 mg | ORAL_CAPSULE | Freq: Every day | ORAL | Status: DC
  Administered 2015-07-27: 100 mg via ORAL
  Filled 2015-07-26: qty 1

## 2015-07-26 MED ORDER — HYDRALAZINE HCL 20 MG/ML IJ SOLN: 5.0000 mg | INTRAMUSCULAR | Status: DC | PRN

## 2015-07-26 MED ORDER — FUROSEMIDE 80 MG PO TABS
80.0000 mg | ORAL_TABLET | Freq: Two times a day (BID) | ORAL | Status: DC
  Administered 2015-07-27: 80 mg via ORAL
  Filled 2015-07-26 (×2): qty 1

## 2015-07-26 MED ORDER — DILTIAZEM HCL ER COATED BEADS 120 MG PO CP24
120.0000 mg | ORAL_CAPSULE | Freq: Every day | ORAL | Status: DC
  Administered 2015-07-27: 120 mg via ORAL
  Filled 2015-07-26 (×3): qty 1

## 2015-07-26 MED ORDER — KCL IN DEXTROSE-NACL 20-5-0.9 MEQ/L-%-% IV SOLN
INTRAVENOUS | Status: DC
  Filled 2015-07-26 (×2): qty 1000

## 2015-07-26 MED ORDER — FENTANYL CITRATE (PF) 100 MCG/2ML IJ SOLN
25.0000 ug | INTRAMUSCULAR | Status: AC | PRN
  Administered 2015-07-26 (×6): 25 ug via INTRAVENOUS

## 2015-07-26 MED ORDER — ROCURONIUM BROMIDE 100 MG/10ML IV SOLN
INTRAVENOUS | Status: DC | PRN
  Administered 2015-07-26: 10 mg via INTRAVENOUS
  Administered 2015-07-26: 35 mg via INTRAVENOUS

## 2015-07-26 MED ORDER — PHENOL 1.4 % MT LIQD
1.0000 | OROMUCOSAL | Status: DC | PRN
  Filled 2015-07-26: qty 177

## 2015-07-26 MED ORDER — GUAIFENESIN-DM 100-10 MG/5ML PO SYRP: 15.0000 mL | ORAL_SOLUTION | ORAL | Status: DC | PRN

## 2015-07-26 MED ORDER — ACETAMINOPHEN 500 MG PO TABS
1000.0000 mg | ORAL_TABLET | Freq: Two times a day (BID) | ORAL | Status: DC
  Administered 2015-07-26 (×2): 1000 mg via ORAL
  Filled 2015-07-26 (×3): qty 2

## 2015-07-26 MED ORDER — DOPAMINE-DEXTROSE 3.2-5 MG/ML-% IV SOLN
INTRAVENOUS | Status: AC
  Filled 2015-07-26: qty 250

## 2015-07-26 MED ORDER — ALUM & MAG HYDROXIDE-SIMETH 200-200-20 MG/5ML PO SUSP: 15.0000 mL | ORAL | Status: DC | PRN

## 2015-07-26 MED ORDER — FENTANYL CITRATE (PF) 100 MCG/2ML IJ SOLN
INTRAMUSCULAR | Status: AC
  Administered 2015-07-26: 25 ug via INTRAVENOUS
  Filled 2015-07-26: qty 2

## 2015-07-26 MED ORDER — ATORVASTATIN CALCIUM 20 MG PO TABS: 40.0000 mg | ORAL_TABLET | Freq: Every day | ORAL | Status: DC

## 2015-07-26 MED ORDER — SENNOSIDES-DOCUSATE SODIUM 8.6-50 MG PO TABS
1.0000 | ORAL_TABLET | Freq: Two times a day (BID) | ORAL | Status: DC
  Administered 2015-07-26 – 2015-07-27 (×3): 1 via ORAL
  Filled 2015-07-26 (×3): qty 1

## 2015-07-26 MED ORDER — LIDOCAINE-PRILOCAINE 2.5-2.5 % EX CREA: 1.0000 "application " | TOPICAL_CREAM | CUTANEOUS | Status: DC | PRN

## 2015-07-26 MED ORDER — ASPIRIN 81 MG PO CHEW
81.0000 mg | CHEWABLE_TABLET | Freq: Every day | ORAL | Status: DC
  Administered 2015-07-26 – 2015-07-27 (×2): 81 mg via ORAL
  Filled 2015-07-26 (×2): qty 1

## 2015-07-26 MED ORDER — INSULIN ASPART 100 UNIT/ML ~~LOC~~ SOLN
0.0000 [IU] | Freq: Three times a day (TID) | SUBCUTANEOUS | Status: DC
Start: 2015-07-26 — End: 2015-07-26

## 2015-07-26 MED ORDER — BIOTIN 1 MG PO CAPS: 1.0000 mg | ORAL_CAPSULE | Freq: Every day | ORAL | Status: DC

## 2015-07-26 MED ORDER — LABETALOL HCL 5 MG/ML IV SOLN: 10.0000 mg | INTRAVENOUS | Status: DC | PRN

## 2015-07-26 MED ORDER — ONDANSETRON HCL 4 MG/2ML IJ SOLN
INTRAMUSCULAR | Status: DC | PRN
  Administered 2015-07-26: 4 mg via INTRAVENOUS

## 2015-07-26 SURGICAL SUPPLY — 68 items
APPLIER CLIP 11 MED OPEN (CLIP)
APPLIER CLIP 9.375 SM OPEN (CLIP)
BAG DECANTER FOR FLEXI CONT (MISCELLANEOUS) ×3 IMPLANT
BLADE SURG 15 STRL LF DISP TIS (BLADE) ×1 IMPLANT
BLADE SURG 15 STRL SS (BLADE) ×2
BLADE SURG SZ11 CARB STEEL (BLADE) ×3 IMPLANT
BOOT SUTURE AID YELLOW STND (SUTURE) ×6 IMPLANT
BRUSH SCRUB 4% CHG (MISCELLANEOUS) ×3 IMPLANT
CANISTER SUCT 1200ML W/VALVE (MISCELLANEOUS) ×3 IMPLANT
CATH TRAY 16F METER LATEX (MISCELLANEOUS) ×3 IMPLANT
CHLORAPREP W/TINT 26ML (MISCELLANEOUS) IMPLANT
CLIP APPLIE 11 MED OPEN (CLIP) IMPLANT
CLIP APPLIE 9.375 SM OPEN (CLIP) IMPLANT
COVER PROBE FLX POLY STRL (MISCELLANEOUS) ×3 IMPLANT
DRAPE INCISE IOBAN 66X45 STRL (DRAPES) ×3 IMPLANT
DRAPE LAPAROTOMY 100X77 ABD (DRAPES) ×3 IMPLANT
DRAPE SHEET LG 3/4 BI-LAMINATE (DRAPES) ×3 IMPLANT
DRESSING SURGICEL FIBRLLR 1X2 (HEMOSTASIS) ×1 IMPLANT
DRSG SURGICEL FIBRILLAR 1X2 (HEMOSTASIS) ×3
DURAPREP 26ML APPLICATOR (WOUND CARE) ×3 IMPLANT
ELECT CAUTERY BLADE 6.4 (BLADE) ×3 IMPLANT
ELECT REM PT RETURN 9FT ADLT (ELECTROSURGICAL) ×3
ELECTRODE REM PT RTRN 9FT ADLT (ELECTROSURGICAL) ×1 IMPLANT
EVICEL 2ML SEALANT HUMAN (Miscellaneous) ×3 IMPLANT
GAUZE SPONGE 4X4 12PLY STRL (GAUZE/BANDAGES/DRESSINGS) ×3 IMPLANT
GLOVE BIO SURGEON STRL SZ7 (GLOVE) ×15 IMPLANT
GLOVE INDICATOR 7.5 STRL GRN (GLOVE) ×12 IMPLANT
GLOVE SURG SYN 8.0 (GLOVE) ×3 IMPLANT
GOWN STRL REUS W/ TWL LRG LVL3 (GOWN DISPOSABLE) ×4 IMPLANT
GOWN STRL REUS W/ TWL XL LVL3 (GOWN DISPOSABLE) ×1 IMPLANT
GOWN STRL REUS W/TWL LRG LVL3 (GOWN DISPOSABLE) ×8
GOWN STRL REUS W/TWL XL LVL3 (GOWN DISPOSABLE) ×2
HOLDER FOLEY CATH W/STRAP (MISCELLANEOUS) ×3 IMPLANT
IV NS 500ML (IV SOLUTION)
IV NS 500ML BAXH (IV SOLUTION) IMPLANT
KIT RM TURNOVER STRD PROC AR (KITS) ×3 IMPLANT
LABEL OR SOLS (LABEL) ×3 IMPLANT
LIQUID BAND (GAUZE/BANDAGES/DRESSINGS) ×3 IMPLANT
LOOP RED MAXI  1X406MM (MISCELLANEOUS) ×4
LOOP VESSEL MAXI 1X406 RED (MISCELLANEOUS) ×2 IMPLANT
LOOP VESSEL MINI 0.8X406 BLUE (MISCELLANEOUS) ×1 IMPLANT
LOOPS BLUE MINI 0.8X406MM (MISCELLANEOUS) ×2
MICROPUNCTURE 5FR NT-U-SST (MISCELLANEOUS) ×3
NEEDLE FILTER BLUNT 18X 1/2SAF (NEEDLE) ×2
NEEDLE FILTER BLUNT 18X1 1/2 (NEEDLE) ×1 IMPLANT
NEEDLE HYPO 25X1 1.5 SAFETY (NEEDLE) ×3 IMPLANT
NS IRRIG 500ML POUR BTL (IV SOLUTION) ×3 IMPLANT
PACK BASIN MAJOR ARMC (MISCELLANEOUS) ×3 IMPLANT
PENCIL ELECTRO HAND CTR (MISCELLANEOUS) IMPLANT
SET MICROPUNCTURE 5FR NT-U-SST (MISCELLANEOUS) ×1 IMPLANT
SHUNT CAROTID STR REINF 3.0X4. (MISCELLANEOUS) ×3 IMPLANT
SUT MNCRL+ 5-0 UNDYED PC-3 (SUTURE) ×1 IMPLANT
SUT MONOCRYL 5-0 (SUTURE) ×2
SUT PROLENE 6 0 BV (SUTURE) ×27 IMPLANT
SUT PROLENE 7 0 BV 1 (SUTURE) ×24 IMPLANT
SUT SILK 2 0 (SUTURE) ×2
SUT SILK 2-0 18XBRD TIE 12 (SUTURE) ×1 IMPLANT
SUT SILK 3 0 (SUTURE) ×2
SUT SILK 3-0 18XBRD TIE 12 (SUTURE) ×1 IMPLANT
SUT SILK 4 0 (SUTURE) ×2
SUT SILK 4-0 18XBRD TIE 12 (SUTURE) ×1 IMPLANT
SUT VIC AB 3-0 SH 27 (SUTURE) ×4
SUT VIC AB 3-0 SH 27X BRD (SUTURE) ×2 IMPLANT
SYR 20CC LL (SYRINGE) ×3 IMPLANT
SYR 3ML LL SCALE MARK (SYRINGE) ×3 IMPLANT
SYRINGE 10CC LL (SYRINGE) IMPLANT
TUBING CONNECTING 10 (TUBING) IMPLANT
TUBING CONNECTING 10' (TUBING)

## 2015-07-26 NOTE — Anesthesia Postprocedure Evaluation (Signed)
Anesthesia Post Note  Patient: [name]  Procedure(s) Performed: Procedure(s) (LRB): ENDARTERECTOMY CAROTID (Left)  Patient location during evaluation: PACU Anesthesia Type: General Level of consciousness: awake Pain management: satisfactory to patient Vital Signs Assessment: post-procedure vital signs reviewed and stable Cardiovascular status: stable Anesthetic complications: no    Last Vitals:  Filed Vitals:   07/26/15 0623 07/26/15 1106  BP: 142/55 101/48  Pulse: 68 72  Temp: 36.8 C 36.3 C  Resp: 12 17    Last Pain:  Filed Vitals:   07/26/15 1130  PainSc: 8                  VAN STAVEREN,Lindsay Straka

## 2015-07-26 NOTE — Anesthesia Preprocedure Evaluation (Signed)
Anesthesia Evaluation  Patient identified by MRN, date of birth, ID band Patient awake    Reviewed: Allergy & Precautions, NPO status   Airway Mallampati: II       Dental  (+) Edentulous Upper, Edentulous Lower   Pulmonary shortness of breath,    breath sounds clear to auscultation       Cardiovascular hypertension, +CHF  + dysrhythmias Atrial Fibrillation  Rhythm:Irregular     Neuro/Psych CVA    GI/Hepatic Neg liver ROS, GERD  ,  Endo/Other  diabetes, Well Controlled, Type 1  Renal/GU DialysisRenal disease     Musculoskeletal   Abdominal Normal abdominal exam  (+)   Peds  Hematology  (+) anemia ,   Anesthesia Other Findings   Reproductive/Obstetrics                             Anesthesia Physical Anesthesia Plan  ASA: III  Anesthesia Plan: General   Post-op Pain Management:    Induction: Intravenous  Airway Management Planned: Oral ETT  Additional Equipment:   Intra-op Plan:   Post-operative Plan: Extubation in OR  Informed Consent: I have reviewed the patients History and Physical, chart, labs and discussed the procedure including the risks, benefits and alternatives for the proposed anesthesia with the patient or authorized representative who has indicated his/her understanding and acceptance.     Plan Discussed with: CRNA  Anesthesia Plan Comments:         Anesthesia Quick Evaluation

## 2015-07-26 NOTE — OR Nursing (Signed)
Patient extremely nervous and is startled by just touching her.  She will cry out if you just touch her hand, etc.  When asked what is wrong she responds by saying that "I don't know."

## 2015-07-26 NOTE — Op Note (Signed)
OPERATIVE NOTE   PROCEDURE: 1. Insertion of arterial catheter left brachial approach.  PRE-OPERATIVE DIAGNOSIS: Critical carotid stenosis  POST-OPERATIVE DIAGNOSIS: Critical carotid stenosis; atherosclerotic occlusive disease bilateral upper extremities with occlusion of the left radial artery  SURGEON: Renford Dills M.D.  ANESTHESIA: 1% lidocaine local infiltration  ESTIMATED BLOOD LOSS: Minimal cc  INDICATIONS:   [name] is a 82 y.o. female who presents with critical carotid stenosis and is undergoing left carotid endarterectomy to prevent stroke. Anesthesia was unable to access the left radial artery for placement of an arterial line on the right side she has an AV fistula and therefore this is not a candidate. I am asked to place the arterial line with ultrasound guidance..  DESCRIPTION: After obtaining full informed written consent, the patient was positioned supine. The left arm was prepped and draped in a sterile fashion. Ultrasound was placed in a sterile sleeve. Ultrasound was utilized to identify the brachial artery which is noted to be echolucent and pulsatile indicating patency. Of note I did scan down from the brachial artery and identified a point in the mid forearm where the radial artery appears to occlude and there was no longer echolucent image and the structure becomes nonpulsatile consistent with occlusion. Images recorded for the permanent record. Under real-time visualization a Micro-needle is inserted into the artery and the guidewire is advanced without difficulty. Small counterincision was made at the wire insertion site. Arterial catheter is passed over the wire without difficulty.  Catheter secured to the skin of the distal upper arm with 2-0 silk. A sterile dressing is applied with Biopatch.  COMPLICATIONS: None  CONDITION: Good  Renford Dills, M.D. [facility] Office:  [phone]   07/26/2015, 1:51 PM

## 2015-07-26 NOTE — Op Note (Signed)
[facility]   OPERATIVE NOTE  PROCEDURE:   1.  Left carotid endarterectomy with Primary Closure  PRE-OPERATIVE DIAGNOSIS: 1.  Critical stenosis of the left Carotid Artery 2. Cerebrovascular accident  POST-OPERATIVE DIAGNOSIS: same as above   SURGEON: Libia Fazzini G SchRenford DillsSTANT(S): Ms. Raul Del  ANESTHESIA: general  ESTIMATED BLOOD LOSS: 50 cc  FINDING(S): 1.  Extensive calcified carotid plaque.  SPECIMEN(S):  Carotid plaque (sent to Pathology)  INDICATIONS:   [name] is a 82 y.o. y.o. female who presents with left carotid stenosis of 90 %.  The risks, benefits, and alternatives to carotid endarterectomy were discussed with the patient. The differences between carotid stenting and carotid endarterectomy were reviewed.  The patient voiced understanding and appears to be aware that the risks of carotid endarterectomy include but are not limited to: bleeding, infection, stroke, myocardial infarction, death, cranial nerve injuries both temporary and permanent, neck hematoma, possible airway compromise, labile blood pressure post-operatively, cerebral hyperperfusion syndrome, and possible need for additional interventions in the future. The patient is aware of the risks and agrees to proceed forward with the procedure.  DESCRIPTION: After full informed written consent was obtained from the patient, the patient was brought back to the operating room and placed supine upon the operating table.  Prior to induction, the patient received IV antibiotics.  After obtaining adequate anesthesia, the patient was placed a supine position with a shoulder roll in place and the patient's neck slightly hyperextended and rotated away from the surgical site.  The patient was prepped in the standard fashion for a carotid endarterectomy.    The skin incision was made in the left neck anterior to the sternocleidomastoid muscle and dissected down through the subcutaneous  tissue.  The platysmas was opened with electrocautery.  The internal jugular vein and facial vein were identified.  The facial vein is ligated and divided between 2-0 silk ties.  The omohyoid was identified in the common carotid artery exposed at this level. The dissection was there in carried out along the carotid artery in a cranial direction.  The dissection was then carried along periadventitial plane along the common carotid artery up to the bifurcation. The external carotid artery was identified. Vessel loops were then placed around the external carotid artery as well as the superior thyroid artery. In the process of this dissection, the hypoglossal nerve was identified and protected from harm.  The internal carotid artery was then dissected circumferentially just beyond an area in the internal carotid artery distal to the plaque.    At this point, we gave the patient 7000 units of intravenous heparin and this was allowed to circulate for several minutes.  The common carotid artery followed by the external carotid and then the internal carotid artery were clamped.  Arteriotomy was made in the common carotid artery with a 11 blade, and extended the arteriotomy with a Potts scissor down into the common carotid artery, then the arteriotomy was carried through the bifurcation into the internal carotid artery until I reached an area that was not diseased.  At this point, a Sundt shunt was placed without difficulty.  The endarterectomy was begun in the common carotid artery with a Runner, broadcasting/film/video and carried this dissection down into the common carotid artery circumferentially.  Then I transected the plaque at a segment where it was adherent and transected the plaque with Potts scissors.  I then carried this dissection up into the external carotid artery.  The plaque  was extracted by unclamping the external carotid artery and performing an eversion endarterectomy.  The dissection was then carried into the  internal carotid artery where a  feathered end point was created.  The plaque was passed off the field as a specimen.  The distal endpoint was tacked down with 6 interrupted 7-0 Prolene sutures.  The arteriotomy was then closed using running 6-0 Prolene beginning at the distal margin and running the second 6-0 Prolene from the common carotid end.  Prior to completing the primary repair, the shunt was removed, the internal carotid artery was flushed and there was excellent backbleeding.  The carotid artery repair was flushed with heparinized saline and then the primary repair was completed in the usual fashion.  The flow was then reestablished first to the external carotid artery and then the internal carotid artery to prevent distal embolization.   Several minutes of pressure were held and 6-0 Prolene sutures were used as need for hemostasis.  At this point, I placed Surgicel and Evicel topical hemostatic agents.  There was no more active bleeding in the surgical site.  The sternocleidomastoid space was closed with three interrupted 3-0 Vicryl sutures. I then reapproximated the platysma muscle with a running stitch of 3-0 Vicryl.  The skin was then closed with a running subcuticular 4-0 Monocryl.  The skin was then cleaned, dried and Dermabond was used to reinforce the skin closure.  The patient awakened and was taken to the recovery room in stable condition, following commands and moving all four extremities without any apparent deficits.    COMPLICATIONS: none  CONDITION: stable  [name], [name] 07/26/2015<11:25 AM

## 2015-07-26 NOTE — Progress Notes (Signed)
Notified Dr. Gilda Crease pts map remains low at 41 and systolic A line blood pressure remains in the 80 to 90's Dr Gilda Crease acknowledged given orders to continue to monitor and hold po cardizem will continue to monitor and assess pt

## 2015-07-26 NOTE — Progress Notes (Signed)
Pt remains alert and oriented neurologically intact; left neck incision site free of hematoma or bleeding; no c/o pain; blood pressure's have improved; aflutter on cardiac monitor with heart rate ranging between 50 to 60's; will continue to monitor and assess pt

## 2015-07-26 NOTE — Transfer of Care (Signed)
Immediate Anesthesia Transfer of Care Note  Patient: [name]  Procedure(s) Performed: Procedure(s): ENDARTERECTOMY CAROTID (Left)  Patient Location: PACU  Anesthesia Type:General  Level of Consciousness: awake, alert  and oriented  Airway & Oxygen Therapy: Patient Spontanous Breathing  Post-op Assessment: Report given to RN and Post -op Vital signs reviewed and stable  Post vital signs: Reviewed and stable  Last Vitals:  Filed Vitals:   07/26/15 0623 07/26/15 1106  BP: 142/55 101/48  Pulse: 68   Temp: 36.8 C 36.3 C  Resp: 12     Complications: No apparent anesthesia complications

## 2015-07-26 NOTE — Progress Notes (Signed)
Notified Dr. Gilda Crease pts cuff pressure significantly different then A line blood pressure asked md which pressure should be monitored Dr. Gilda Crease stated to monitor A line blood pressures; informed md pts map according to A line blood pressure is currently 51 with systolic bp 80 to 90's asked md if he wanted Dopamine drip to be initiated he stated at this time he does not want dopamine drip started he would like NS at 10 ml/hr to infuse and all other IV fluid orders discontinued due to pts renal disease

## 2015-07-26 NOTE — Anesthesia Procedure Notes (Signed)
Procedure Name: Intubation Date/Time: 07/26/2015 8:21 AM Performed by: Almeta Monas Pre-anesthesia Checklist: Patient identified, Emergency Drugs available, Suction available, Patient being monitored and Timeout performed Patient Re-evaluated:Patient Re-evaluated prior to inductionOxygen Delivery Method: Circle system utilized Preoxygenation: Pre-oxygenation with 100% oxygen Intubation Type: IV induction Laryngoscope Size: Mac and 3 Grade View: Grade I Tube type: Oral Tube size: 7.0 mm Number of attempts: 1 Airway Equipment and Method: Patient positioned with wedge pillow and Stylet Placement Confirmation: ETT inserted through vocal cords under direct vision,  positive ETCO2,  CO2 detector and breath sounds checked- equal and bilateral Secured at: 21 cm Dental Injury: Teeth and Oropharynx as per pre-operative assessment

## 2015-07-26 NOTE — OR Nursing (Signed)
Difficult to assess neuro function because she says  Both her hands are painful, but grips are equal, if not weak and able to push and pull against my hands with force.

## 2015-07-26 NOTE — OR Nursing (Signed)
Patient with scabbed lesion on the lower left side of the abdomen,  No drainage noted.  Area is noted with hardness surrounding the scabbing.  Dr. Gilda Crease aware.  No further orders.  Patient states scabbing is from previous fall in January.

## 2015-07-26 NOTE — H&P (Signed)
[facility] History & Physical Update  The patient was interviewed and re-examined.  The patient's previous History and Physical has been reviewed and is unchanged.  There is no change in the plan of care. We plan to proceed with the scheduled procedure.  Arlenis Blaydes, [name], MD  07/26/2015, 7:27 AM

## 2015-07-26 NOTE — OR Nursing (Signed)
Dr. Teresa Pelton informed about patient's blood pressures and 500 cc bolus given

## 2015-07-27 LAB — GLUCOSE, CAPILLARY
GLUCOSE-CAPILLARY: 114 mg/dL — AB (ref 65–99)
GLUCOSE-CAPILLARY: 109 mg/dL — AB (ref 65–99)

## 2015-07-27 LAB — CBC
HEMATOCRIT: 29.3 % — AB (ref 35.0–47.0)
HEMOGLOBIN: 9.7 g/dL — AB (ref 12.0–16.0)
MCH: 32.1 pg (ref 26.0–34.0)
MCHC: 33 g/dL (ref 32.0–36.0)
MCV: 97.2 fL (ref 80.0–100.0)
Platelets: 214 10*3/uL (ref 150–440)
RBC: 3.02 MIL/uL — ABNORMAL LOW (ref 3.80–5.20)
RDW: 15.2 % — AB (ref 11.5–14.5)
WBC: 9.4 10*3/uL (ref 3.6–11.0)

## 2015-07-27 LAB — BASIC METABOLIC PANEL
ANION GAP: 12 (ref 5–15)
BUN: 46 mg/dL — ABNORMAL HIGH (ref 6–20)
CALCIUM: 7.8 mg/dL — AB (ref 8.9–10.3)
CHLORIDE: 104 mmol/L (ref 101–111)
CO2: 18 mmol/L — AB (ref 22–32)
CREATININE: 5.12 mg/dL — AB (ref 0.44–1.00)
GFR calc non Af Amer: 7 mL/min — ABNORMAL LOW (ref >60)
GFR, EST AFRICAN AMERICAN: 8 mL/min — AB (ref >60)
Glucose, Bld: 117 mg/dL — ABNORMAL HIGH (ref 65–99)
Potassium: 4.2 mmol/L (ref 3.5–5.1)
SODIUM: 134 mmol/L — AB (ref 135–145)

## 2015-07-27 MED ORDER — PRAVASTATIN SODIUM 20 MG PO TABS: 20.0000 mg | ORAL_TABLET | Freq: Every day | ORAL | Status: DC

## 2015-07-27 MED ORDER — TRAMADOL HCL 50 MG PO TABS: 50.0000 mg | ORAL_TABLET | Freq: Four times a day (QID) | ORAL | Status: DC | PRN

## 2015-07-27 NOTE — Progress Notes (Signed)
[facility]  Daily Progress Note   Subjective: 1 Day Post-Op: Left carotid endarterectomy with primary closure  Daughter at bedside. Patient doing well. Night of surgery unremarkable. Will dialyze today as per nephrology.   Objective: Filed Vitals:   07/27/15 0800 07/27/15 0900 07/27/15 1000 07/27/15 1100  BP: 124/55 131/78 124/56 125/69  Pulse: 87 83 80 79  Temp:      TempSrc:      Resp: Height:      Weight:      SpO2: 100% 100% 100% 100%    Intake/Output Summary (Last 24 hours) at 07/27/15 1225 Last data filed at 07/27/15 0556  Gross per 24 hour  Intake    500 ml  Output    175 ml  Net    325 ml   Physical Exam: A&Ox3, NAD Neck: Incision without drainage or swelling - clean, dry and intact CV: Irregular Pulmonary: CTA Bilaterally Right Upper Extremity: AVF good bruit / thrill Abdomen: Soft, Nontender, Nondistended   Laboratory: CBC    Component Value Date/Time   WBC 9.4 07/27/2015 0652   WBC 7.9 12/03/2013 0820   HGB 9.7* 07/27/2015 0652   HGB 14.2 12/03/2013 0820   HCT 29.3* 07/27/2015 0652   HCT 42.7 12/03/2013 0820   PLT 214 07/27/2015 0652   PLT 214 12/03/2013 0820   BMET    Component Value Date/Time   NA 134* 07/27/2015 0652   NA 137 07/04/2014 1303   K 4.2 07/27/2015 0652   K 3.3* 07/04/2014 1303   CL 104 07/27/2015 0652   CL 100 07/04/2014 1303   CO2 18* 07/27/2015 0652   CO2 29 07/04/2014 1303   GLUCOSE 117* 07/27/2015 0652   GLUCOSE 76 07/04/2014 1303   BUN 46* 07/27/2015 0652   BUN 44* 07/04/2014 1303   CREATININE 5.12* 07/27/2015 0652   CREATININE 2.72* 07/04/2014 1303   CALCIUM 7.8* 07/27/2015 0652   CALCIUM 8.9 07/04/2014 1303   GFRNONAA 7* 07/27/2015 0652   GFRNONAA 18* 07/04/2014 1303   GFRNONAA 29* 12/02/2013 0847   GFRAA 8* 07/27/2015 0652   GFRAA 22* 07/04/2014 1303   GFRAA 34* 12/02/2013 0847   Assessment/Planning: 1 Day Post-Op: Left carotid endarterectomy with primary closure - doing  well 1) Will dialyze today as per nephrology 2) OK to discharge home after dialysis 3) Discussed with Dr. [name] Pitter Stegmayer PA-C 07/27/2015 12:25 PM

## 2015-07-27 NOTE — Progress Notes (Signed)
Subjective:  Patient is s/p left CEA 07/26/15 Doing well No SOB Wondering about skipping dialysis and going home   Objective:  Vital signs in last 24 hours:  Temp:  [98.3 F (36.8 C)-98.5 F (36.9 C)] 98.3 F (36.8 C) (02/24 1800) Pulse Rate:  [57-87] 79 (02/25 1100) Resp:  [13-21] 13 (02/25 1100) BP: (72-134)/(26-78) 125/69 mmHg (02/25 1100) SpO2:  [94 %-100 %] 100 % (02/25 1100) Arterial Line BP: (77-154)/(24-67) 154/42 mmHg (02/25 0500) FiO2 (%):  [32 %] 32 % (02/24 1313) Weight:  [75 kg (165 lb 5.5 oz)] 75 kg (165 lb 5.5 oz) (02/24 1307)  Weight change: 2.424 kg (5 lb 5.5 oz) Filed Weights   07/26/15 0623 07/26/15 1307  Weight: 72.576 kg (160 lb) 75 kg (165 lb 5.5 oz)    Intake/Output:    Intake/Output Summary (Last 24 hours) at 07/27/15 1146 Last data filed at 07/27/15 0556  Gross per 24 hour  Intake    500 ml  Output    175 ml  Net    325 ml     Physical Exam: General: NAD  HEENT anicteric  Neck Left CEA surgery  Pulm/lungs Clear b/l  CVS/Heart Irregular, no rub  Abdomen:  Soft, NT  Extremities: No edema  Neurologic: Alert, oriented  Skin: No acute rashes  Access: AVF       Basic Metabolic Panel:   Recent Labs Lab 07/26/15 0655 07/26/15 0659 07/27/15 0652  NA  --  136 134*  K 3.8 3.6 4.2  CL  --   --  104  CO2  --   --  18*  GLUCOSE  --  127* 117*  BUN  --   --  46*  CREATININE  --   --  5.12*  CALCIUM  --   --  7.8*     CBC:  Recent Labs Lab 07/26/15 0659 07/27/15 0652  WBC  --  9.4  HGB 11.9* 9.7*  HCT 35.0* 29.3*  MCV  --  97.2  PLT  --  214      Microbiology:  Recent Results (from the past 720 hour(s))  Surgical pcr screen     Status: None   Collection Time: 07/19/15 12:16 PM  Result Value Ref Range Status   MRSA, PCR NEGATIVE NEGATIVE Final   Staphylococcus aureus NEGATIVE NEGATIVE Final    Comment:        The Xpert SA Assay (FDA approved for NASAL specimens in patients over 80 years of age), is one  component of a comprehensive surveillance program.  Test performance has been validated by [facility] for patients greater than or equal to 43 year old. It is not intended to diagnose infection nor to guide or monitor treatment.   MRSA PCR Screening     Status: None   Collection Time: 07/26/15  5:00 PM  Result Value Ref Range Status   MRSA by PCR NEGATIVE NEGATIVE Final    Comment:        The GeneXpert MRSA Assay (FDA approved for NASAL specimens only), is one component of a comprehensive MRSA colonization surveillance program. It is not intended to diagnose MRSA infection nor to guide or monitor treatment for MRSA infections.     Coagulation Studies:  Recent Labs  07/26/15 0655  LABPROT 16.3*  INR 1.30    Urinalysis: No results for input(s): COLORURINE, LABSPEC, PHURINE, GLUCOSEU, HGBUR, BILIRUBINUR, KETONESUR, PROTEINUR, UROBILINOGEN, NITRITE, LEUKOCYTESUR in the last 72 hours.  Invalid input(s): APPERANCEUR  Imaging: No results found.   Medications:   . sodium chloride 10 mL/hr at 07/26/15 1800  . DOPamine     . acetaminophen  1,000 mg Oral BID  . aspirin  81 mg Oral Daily  . atorvastatin  40 mg Oral q1800  . calcium acetate (Phos Binder)  1,334 mg Oral TID WC  . diltiazem  120 mg Oral Daily  . docusate sodium  100 mg Oral Daily  . famotidine  20 mg Oral BID  . furosemide  80 mg Oral BID  . gabapentin  100 mg Oral Once per day on Sun Mon Wed Fri  . gabapentin  300 mg Oral Once per day on Tue Thu Sat  . insulin aspart  0-15 Units Subcutaneous TID WC  . insulin detemir  11 Units Subcutaneous BID  . multivitamin  1 tablet Oral QHS  . omega-3 acid ethyl esters  1 g Oral BID  . pantoprazole (PROTONIX) IV  40 mg Intravenous QHS  . PARoxetine  40 mg Oral QHS  . senna-docusate  1 tablet Oral BID   sodium chloride, acetaminophen **OR** acetaminophen, alum & mag hydroxide-simeth, bisacodyl, diphenhydrAMINE, guaiFENesin-dextromethorphan, hydrALAZINE,  labetalol, lidocaine-prilocaine, metoprolol, morphine injection, ondansetron, phenol, traMADol, traMADol  Assessment/ Plan:  82 y.o. female with past medical history of atrial fibrillation on Coumadin, hypertension, gout, insulin-dependent diabetes mellitus type 2, diabetic neuropathy, CVA with right visual field blindness, hyperlipidemia, major depressive disorder, overactive bladder with incontinence, generalized anxiety disorder, congestive heart failure diastolic, parathyroidectomy, and osteoarthritis   [facility] [address]   1. ESRD on HD TTHS:  - routine HD today - patient requesting a short treatment  2. Anemia chronic kidney disease. hgb 9.7 - low dose EPO with HD  3. Secondary hyperparathyroidism.  - Monitor phos - Continue phoslyra       LOS: 1 Khris Jansson 2/25/201711:46 AM

## 2015-07-27 NOTE — Progress Notes (Signed)
Discharged home after hemodialysis.  Discharge instructions relayed to both the patient and daughter Avon Gully).  Both verbalized understanding.  All IV's removed.  Catheters appeared to be intact.  Left floor via wheelchair with all personal belongings.  Pt escorted to front door and was assisted safely into vehicle.

## 2015-07-27 NOTE — Progress Notes (Signed)
Pt transported to Dialysis by Eber Jones, orderly.

## 2015-07-27 NOTE — Progress Notes (Signed)
Art line and foley removed per orders.  125 ml urine disposed of. Catheters of both art line and foley appear to be intact.  Pt tolerated both procedures well. Will continue to monitor.

## 2015-07-27 NOTE — Discharge Summary (Signed)
[facility]    Discharge Summary    Patient ID:  [name] MRN: [id number] DOB/AGE: [birth year] 82 y.o.  Admit date: 07/26/2015 Discharge date: 07/27/2015 Date of Surgery: 07/26/2015 Surgeon: Surgeon(s): Renford Dills, MD  Admission Diagnosis: CAROTID ARTERY STENOSIS  Discharge Diagnoses:  CAROTID ARTERY STENOSIS  Secondary Diagnoses: Past Medical History  Diagnosis Date  . Chronic kidney disease   . Dysrhythmia   . Diabetes mellitus without complication (HCC)   . Hypertension   . Stroke (HCC)   . Arthritis     gout  . Dialysis patient (HCC)   . Pleural effusion   . Pulmonary hypertension (HCC)   . Renal insufficiency   . CHF (congestive heart failure) (HCC)   . Peripheral vascular disease (HCC)   . Shortness of breath dyspnea     with exertion  . GERD (gastroesophageal reflux disease)   . Anemia   . Restless leg syndrome    Procedure(s): ENDARTERECTOMY CAROTID  Discharged Condition: good  HPI:  [name] is a 82 year old female who presents with left carotid stenosis of 90%. On 07/26/15, she underwent a Left carotid endarterectomy with Primary Closure. Her night of surgery was unremarkable. Nephrology was consulted and she received one dialysis treatment before discharge.   Hospital Course:  [name] is a 82 y.o. female is S/P Left Procedure(s): ENDARTERECTOMY CAROTID  Extubated: POD # 0  Physical exam:  A&Ox3, NAD Neck: Incision without drainage or swelling - clean, dry and intact, trachea midline CV: Irregular Pulmonary: CTA Bilaterally Right Upper Extremity: AVF good bruit / thrill Abdomen: Soft, Non-tender, Non-distended  Post-op wounds clean, dry, intact or healing well Pt. Ambulating, voiding and taking PO diet without difficulty. Pt pain controlled with PO pain meds.  Labs as below  Complications:none  Consults:  Treatment Team:  Mosetta Pigeon, MD  Significant Diagnostic Studies: CBC Lab  Results  Component Value Date   WBC 9.4 07/27/2015   HGB 9.7* 07/27/2015   HCT 29.3* 07/27/2015   MCV 97.2 07/27/2015   PLT 214 07/27/2015   BMET    Component Value Date/Time   NA 134* 07/27/2015 0652   NA 137 07/04/2014 1303   K 4.2 07/27/2015 0652   K 3.3* 07/04/2014 1303   CL 104 07/27/2015 0652   CL 100 07/04/2014 1303   CO2 18* 07/27/2015 0652   CO2 29 07/04/2014 1303   GLUCOSE 117* 07/27/2015 0652   GLUCOSE 76 07/04/2014 1303   BUN 46* 07/27/2015 0652   BUN 44* 07/04/2014 1303   CREATININE 5.12* 07/27/2015 0652   CREATININE 2.72* 07/04/2014 1303   CALCIUM 7.8* 07/27/2015 0652   CALCIUM 8.9 07/04/2014 1303   GFRNONAA 7* 07/27/2015 0652   GFRNONAA 18* 07/04/2014 1303   GFRNONAA 29* 12/02/2013 0847   GFRAA 8* 07/27/2015 0652   GFRAA 22* 07/04/2014 1303   GFRAA 34* 12/02/2013 0847   COAG Lab Results  Component Value Date   INR 1.30 07/26/2015   INR 3.21 07/19/2015   INR 1.12 06/24/2015   Disposition:  Discharge to :Home    Medication List    STOP taking these medications        atorvastatin 40 MG tablet  Commonly known as:  LIPITOR     insulin aspart 100 UNIT/ML injection  Commonly known as:  novoLOG      TAKE these medications        acetaminophen 500 MG tablet  Commonly known as:  TYLENOL  Take 1,000 mg by mouth 2 (two) times daily.     aspirin 81 MG chewable tablet  Chew 1 tablet (81 mg total) by mouth daily.     Biotin 1 MG Caps  Take 1 mg by mouth daily.     bisacodyl 5 MG EC tablet  Commonly known as:  DULCOLAX  Take 10 mg by mouth daily as needed for moderate constipation.     calcium acetate (Phos Binder) 667 MG/5ML Soln  Commonly known as:  PHOSLYRA  Take 1,334 mg by mouth 3 (three) times daily with meals.     diltiazem 120 MG 24 hr capsule  Commonly known as:  TIAZAC  Take 120 mg by mouth daily.     diphenhydrAMINE 25 mg capsule  Commonly known as:  BENADRYL  Take 1 capsule (25 mg total) by mouth every 6 (six) hours as  needed (rash, itching).     Fish Oil 1200 MG Caps  Take 1,200 mg by mouth daily.     folic acid-vitamin b complex-vitamin c-selenium-zinc 3 MG Tabs tablet  Take 1 tablet by mouth daily.     furosemide 80 MG tablet  Commonly known as:  LASIX  Take 80 mg by mouth 2 (two) times daily.     gabapentin 300 MG capsule  Commonly known as:  NEURONTIN  Take 300 mg by mouth at bedtime. Pt takes on Tuesday, Thursday, and Saturday.     gabapentin 100 MG capsule  Commonly known as:  NEURONTIN  Take 100 mg by mouth at bedtime. Pt takes on Sunday, Monday, Wednesday, and Friday.     Insulin Detemir 100 UNIT/ML Pen  Commonly known as:  LEVEMIR  Inject 11 Units into the skin 2 (two) times daily. 0730, 1730     lidocaine-prilocaine cream  Commonly known as:  EMLA  Apply 1 application topically as needed (prior to accessing port).     PARoxetine 40 MG tablet  Commonly known as:  PAXIL  Take 40 mg by mouth at bedtime.     pravastatin 20 MG tablet  Commonly known as:  PRAVACHOL  Take 1 tablet (20 mg total) by mouth daily.     ranitidine 150 MG tablet  Commonly known as:  ZANTAC  Take 150 mg by mouth at bedtime.     senna-docusate 8.6-50 MG tablet  Commonly known as:  Senokot-S  Take 1 tablet by mouth 2 (two) times daily.     traMADol 50 MG tablet  Commonly known as:  ULTRAM  Take 1 tablet (50 mg total) by mouth every 6 (six) hours as needed for moderate pain.     warfarin 3 MG tablet  Commonly known as:  COUMADIN  Take 1.5 mg by mouth daily. Everyday except Mondays       Verbal and written Discharge instructions given to the patient. Wound care per Discharge AVS     Follow-up Information    Follow up with Schnier, [name], MD In 3 weeks.   Specialties:  Vascular Surgery, Cardiology, Radiology, Vascular Surgery   Why:  Bilateral Carotid Duplex   Contact information:   [address] [place] [address] [phone]      Signed: Tonette Lederer, PA-C  07/27/2015,  2:50 PM

## 2015-07-29 LAB — SURGICAL PATHOLOGY

## 2015-08-13 ENCOUNTER — Inpatient Hospital Stay
Admission: EM | Admit: 2015-08-13 | Discharge: 2015-08-16 | DRG: 193 | Disposition: A | Discharge status: Home Health | Payer: Medicare Other | Attending: Internal Medicine | Admitting: Internal Medicine

## 2015-08-13 ENCOUNTER — Emergency Department: Payer: Medicare Other

## 2015-08-13 ENCOUNTER — Encounter: Payer: Self-pay | Admitting: Emergency Medicine

## 2015-08-13 DIAGNOSIS — I482 Chronic atrial fibrillation: Secondary | ICD-10-CM | POA: Diagnosis present

## 2015-08-13 DIAGNOSIS — Z79891 Long term (current) use of opiate analgesic: Secondary | ICD-10-CM

## 2015-08-13 DIAGNOSIS — N186 End stage renal disease: Secondary | ICD-10-CM | POA: Diagnosis present

## 2015-08-13 DIAGNOSIS — J9621 Acute and chronic respiratory failure with hypoxia: Secondary | ICD-10-CM | POA: Diagnosis present

## 2015-08-13 DIAGNOSIS — Z91041 Radiographic dye allergy status: Secondary | ICD-10-CM | POA: Diagnosis not present

## 2015-08-13 DIAGNOSIS — Z9049 Acquired absence of other specified parts of digestive tract: Secondary | ICD-10-CM

## 2015-08-13 DIAGNOSIS — I5032 Chronic diastolic (congestive) heart failure: Secondary | ICD-10-CM | POA: Diagnosis present

## 2015-08-13 DIAGNOSIS — J09X1 Influenza due to identified novel influenza A virus with pneumonia: Principal | ICD-10-CM | POA: Diagnosis present

## 2015-08-13 DIAGNOSIS — J101 Influenza due to other identified influenza virus with other respiratory manifestations: Secondary | ICD-10-CM | POA: Diagnosis present

## 2015-08-13 DIAGNOSIS — Z885 Allergy status to narcotic agent status: Secondary | ICD-10-CM

## 2015-08-13 DIAGNOSIS — I4891 Unspecified atrial fibrillation: Secondary | ICD-10-CM | POA: Diagnosis present

## 2015-08-13 DIAGNOSIS — Z833 Family history of diabetes mellitus: Secondary | ICD-10-CM

## 2015-08-13 DIAGNOSIS — I5033 Acute on chronic diastolic (congestive) heart failure: Secondary | ICD-10-CM | POA: Diagnosis present

## 2015-08-13 DIAGNOSIS — I132 Hypertensive heart and chronic kidney disease with heart failure and with stage 5 chronic kidney disease, or end stage renal disease: Secondary | ICD-10-CM | POA: Diagnosis present

## 2015-08-13 DIAGNOSIS — D631 Anemia in chronic kidney disease: Secondary | ICD-10-CM | POA: Diagnosis present

## 2015-08-13 DIAGNOSIS — E1151 Type 2 diabetes mellitus with diabetic peripheral angiopathy without gangrene: Secondary | ICD-10-CM | POA: Diagnosis present

## 2015-08-13 DIAGNOSIS — Z888 Allergy status to other drugs, medicaments and biological substances status: Secondary | ICD-10-CM

## 2015-08-13 DIAGNOSIS — Z7951 Long term (current) use of inhaled steroids: Secondary | ICD-10-CM

## 2015-08-13 DIAGNOSIS — Z992 Dependence on renal dialysis: Secondary | ICD-10-CM | POA: Diagnosis not present

## 2015-08-13 DIAGNOSIS — G2581 Restless legs syndrome: Secondary | ICD-10-CM | POA: Diagnosis present

## 2015-08-13 DIAGNOSIS — E1122 Type 2 diabetes mellitus with diabetic chronic kidney disease: Secondary | ICD-10-CM | POA: Diagnosis present

## 2015-08-13 DIAGNOSIS — Z7982 Long term (current) use of aspirin: Secondary | ICD-10-CM | POA: Diagnosis not present

## 2015-08-13 DIAGNOSIS — Z96643 Presence of artificial hip joint, bilateral: Secondary | ICD-10-CM | POA: Diagnosis present

## 2015-08-13 DIAGNOSIS — Z79899 Other long term (current) drug therapy: Secondary | ICD-10-CM | POA: Diagnosis not present

## 2015-08-13 DIAGNOSIS — I739 Peripheral vascular disease, unspecified: Secondary | ICD-10-CM | POA: Diagnosis present

## 2015-08-13 DIAGNOSIS — K219 Gastro-esophageal reflux disease without esophagitis: Secondary | ICD-10-CM | POA: Diagnosis present

## 2015-08-13 DIAGNOSIS — Z794 Long term (current) use of insulin: Secondary | ICD-10-CM

## 2015-08-13 DIAGNOSIS — M109 Gout, unspecified: Secondary | ICD-10-CM | POA: Diagnosis present

## 2015-08-13 DIAGNOSIS — E1140 Type 2 diabetes mellitus with diabetic neuropathy, unspecified: Secondary | ICD-10-CM | POA: Diagnosis present

## 2015-08-13 DIAGNOSIS — Z823 Family history of stroke: Secondary | ICD-10-CM

## 2015-08-13 DIAGNOSIS — Z89422 Acquired absence of other left toe(s): Secondary | ICD-10-CM

## 2015-08-13 DIAGNOSIS — F05 Delirium due to known physiological condition: Secondary | ICD-10-CM | POA: Diagnosis present

## 2015-08-13 DIAGNOSIS — Z8673 Personal history of transient ischemic attack (TIA), and cerebral infarction without residual deficits: Secondary | ICD-10-CM | POA: Diagnosis not present

## 2015-08-13 DIAGNOSIS — Z9981 Dependence on supplemental oxygen: Secondary | ICD-10-CM | POA: Diagnosis not present

## 2015-08-13 DIAGNOSIS — N2581 Secondary hyperparathyroidism of renal origin: Secondary | ICD-10-CM | POA: Diagnosis present

## 2015-08-13 DIAGNOSIS — Z800 Family history of malignant neoplasm of digestive organs: Secondary | ICD-10-CM

## 2015-08-13 DIAGNOSIS — Z7901 Long term (current) use of anticoagulants: Secondary | ICD-10-CM | POA: Diagnosis not present

## 2015-08-13 DIAGNOSIS — J189 Pneumonia, unspecified organism: Secondary | ICD-10-CM

## 2015-08-13 DIAGNOSIS — I272 Other secondary pulmonary hypertension: Secondary | ICD-10-CM | POA: Diagnosis present

## 2015-08-13 DIAGNOSIS — F0390 Unspecified dementia without behavioral disturbance: Secondary | ICD-10-CM | POA: Diagnosis present

## 2015-08-13 HISTORY — DX: Unspecified atrial fibrillation: I48.91

## 2015-08-13 LAB — COMPREHENSIVE METABOLIC PANEL
ALT: 8 U/L — AB (ref 14–54)
AST: 22 U/L (ref 15–41)
Albumin: 2.9 g/dL — ABNORMAL LOW (ref 3.5–5.0)
Alkaline Phosphatase: 88 U/L (ref 38–126)
Anion gap: 18 — ABNORMAL HIGH (ref 5–15)
BUN: 70 mg/dL — ABNORMAL HIGH (ref 6–20)
CHLORIDE: 97 mmol/L — AB (ref 101–111)
CO2: 20 mmol/L — AB (ref 22–32)
CREATININE: 5.47 mg/dL — AB (ref 0.44–1.00)
Calcium: 8.3 mg/dL — ABNORMAL LOW (ref 8.9–10.3)
GFR calc non Af Amer: 7 mL/min — ABNORMAL LOW (ref >60)
GFR, EST AFRICAN AMERICAN: 8 mL/min — AB (ref >60)
Glucose, Bld: 60 mg/dL — ABNORMAL LOW (ref 65–99)
POTASSIUM: 3.2 mmol/L — AB (ref 3.5–5.1)
SODIUM: 135 mmol/L (ref 135–145)
Total Bilirubin: 1.6 mg/dL — ABNORMAL HIGH (ref 0.3–1.2)
Total Protein: 7.8 g/dL (ref 6.5–8.1)

## 2015-08-13 LAB — CBC
HEMATOCRIT: 34 % — AB (ref 35.0–47.0)
HEMOGLOBIN: 11.4 g/dL — AB (ref 12.0–16.0)
MCH: 31.3 pg (ref 26.0–34.0)
MCHC: 33.5 g/dL (ref 32.0–36.0)
MCV: 93.5 fL (ref 80.0–100.0)
Platelets: 341 10*3/uL (ref 150–440)
RBC: 3.63 MIL/uL — AB (ref 3.80–5.20)
RDW: 15.8 % — ABNORMAL HIGH (ref 11.5–14.5)
WBC: 15.2 10*3/uL — ABNORMAL HIGH (ref 3.6–11.0)

## 2015-08-13 LAB — PROTIME-INR
INR: 2.78
Prothrombin Time: 28.9 seconds — ABNORMAL HIGH (ref 11.4–15.0)

## 2015-08-13 LAB — TROPONIN I
TROPONIN I: 0.07 ng/mL — AB (ref <0.031)
Troponin I: 0.07 ng/mL — ABNORMAL HIGH (ref <0.031)
Troponin I: 0.06 ng/mL — ABNORMAL HIGH (ref <0.031)
Troponin I: 0.07 ng/mL — ABNORMAL HIGH (ref <0.031)

## 2015-08-13 LAB — GLUCOSE, CAPILLARY
Glucose-Capillary: 102 mg/dL — ABNORMAL HIGH (ref 65–99)
Glucose-Capillary: 101 mg/dL — ABNORMAL HIGH (ref 65–99)
Glucose-Capillary: 71 mg/dL (ref 65–99)

## 2015-08-13 MED ORDER — INSULIN ASPART 100 UNIT/ML ~~LOC~~ SOLN
0.0000 [IU] | Freq: Three times a day (TID) | SUBCUTANEOUS | Status: DC
  Administered 2015-08-14: 9 [IU] via SUBCUTANEOUS
  Administered 2015-08-14: 1 [IU] via SUBCUTANEOUS
  Administered 2015-08-15 – 2015-08-16 (×2): 2 [IU] via SUBCUTANEOUS
  Filled 2015-08-13: qty 1
  Filled 2015-08-13: qty 2
  Filled 2015-08-13: qty 9

## 2015-08-13 MED ORDER — ACETAMINOPHEN 325 MG PO TABS
650.0000 mg | ORAL_TABLET | Freq: Two times a day (BID) | ORAL | Status: DC
  Administered 2015-08-13 – 2015-08-16 (×7): 650 mg via ORAL
  Filled 2015-08-13 (×7): qty 2

## 2015-08-13 MED ORDER — ASPIRIN 81 MG PO CHEW
81.0000 mg | CHEWABLE_TABLET | Freq: Every day | ORAL | Status: DC
  Administered 2015-08-13 – 2015-08-16 (×4): 81 mg via ORAL
  Filled 2015-08-13 (×4): qty 1

## 2015-08-13 MED ORDER — WARFARIN SODIUM 1 MG PO TABS
1.5000 mg | ORAL_TABLET | Freq: Once | ORAL | Status: DC
  Filled 2015-08-13: qty 1

## 2015-08-13 MED ORDER — FAMOTIDINE 20 MG PO TABS
10.0000 mg | ORAL_TABLET | Freq: Two times a day (BID) | ORAL | Status: DC
  Administered 2015-08-13 – 2015-08-16 (×7): 10 mg via ORAL
  Filled 2015-08-13 (×2): qty 1
  Filled 2015-08-13: qty 2
  Filled 2015-08-13 (×4): qty 1

## 2015-08-13 MED ORDER — DILTIAZEM HCL ER COATED BEADS 120 MG PO CP24
120.0000 mg | ORAL_CAPSULE | Freq: Every day | ORAL | Status: DC
  Administered 2015-08-14 – 2015-08-15 (×2): 120 mg via ORAL
  Filled 2015-08-13 (×2): qty 1

## 2015-08-13 MED ORDER — INSULIN DETEMIR 100 UNIT/ML ~~LOC~~ SOLN
10.0000 [IU] | Freq: Two times a day (BID) | SUBCUTANEOUS | Status: DC
  Administered 2015-08-14 (×2): 10 [IU] via SUBCUTANEOUS
  Filled 2015-08-13 (×5): qty 0.1

## 2015-08-13 MED ORDER — PRAVASTATIN SODIUM 20 MG PO TABS
20.0000 mg | ORAL_TABLET | Freq: Every day | ORAL | Status: DC
  Administered 2015-08-13 – 2015-08-16 (×4): 20 mg via ORAL
  Filled 2015-08-13 (×4): qty 1

## 2015-08-13 MED ORDER — WARFARIN - PHARMACIST DOSING INPATIENT: Freq: Every day | Status: DC

## 2015-08-13 MED ORDER — SENNOSIDES-DOCUSATE SODIUM 8.6-50 MG PO TABS: 1.0000 | ORAL_TABLET | Freq: Every evening | ORAL | Status: DC | PRN

## 2015-08-13 MED ORDER — DIPHENHYDRAMINE HCL 25 MG PO CAPS: 25.0000 mg | ORAL_CAPSULE | Freq: Four times a day (QID) | ORAL | Status: DC | PRN

## 2015-08-13 MED ORDER — DEXTROSE 5 % IV SOLN
500.0000 mg | Freq: Once | INTRAVENOUS | Status: AC
  Administered 2015-08-13: 500 mg via INTRAVENOUS
  Filled 2015-08-13: qty 500

## 2015-08-13 MED ORDER — DOCUSATE SODIUM 100 MG PO CAPS: 200.0000 mg | ORAL_CAPSULE | Freq: Two times a day (BID) | ORAL | Status: DC | PRN

## 2015-08-13 MED ORDER — RENA-VITE PO TABS
1.0000 | ORAL_TABLET | Freq: Every day | ORAL | Status: DC
  Administered 2015-08-14 – 2015-08-16 (×3): 1 via ORAL
  Filled 2015-08-13 (×4): qty 1

## 2015-08-13 MED ORDER — FUROSEMIDE 40 MG PO TABS
80.0000 mg | ORAL_TABLET | Freq: Two times a day (BID) | ORAL | Status: DC
  Administered 2015-08-14 – 2015-08-16 (×4): 80 mg via ORAL
  Filled 2015-08-13 (×4): qty 2

## 2015-08-13 MED ORDER — GABAPENTIN 100 MG PO CAPS
100.0000 mg | ORAL_CAPSULE | ORAL | Status: DC
  Administered 2015-08-14: 100 mg via ORAL
  Filled 2015-08-13: qty 1

## 2015-08-13 MED ORDER — GABAPENTIN 300 MG PO CAPS
300.0000 mg | ORAL_CAPSULE | ORAL | Status: DC
  Administered 2015-08-13 – 2015-08-15 (×2): 300 mg via ORAL
  Filled 2015-08-13 (×2): qty 1

## 2015-08-13 MED ORDER — DEXTROSE 5 % IV SOLN
1.0000 g | Freq: Once | INTRAVENOUS | Status: AC
  Administered 2015-08-13: 1 g via INTRAVENOUS
  Filled 2015-08-13: qty 10

## 2015-08-13 MED ORDER — BIOTIN 1 MG PO CAPS: 1.0000 mg | ORAL_CAPSULE | Freq: Every day | ORAL | Status: DC

## 2015-08-13 MED ORDER — DEXTROSE 5 % IV SOLN
250.0000 mg | INTRAVENOUS | Status: DC
  Administered 2015-08-14 – 2015-08-15 (×2): 250 mg via INTRAVENOUS
  Filled 2015-08-13 (×2): qty 250

## 2015-08-13 MED ORDER — PAROXETINE HCL 20 MG PO TABS
40.0000 mg | ORAL_TABLET | Freq: Every day | ORAL | Status: DC
  Administered 2015-08-13 – 2015-08-15 (×3): 40 mg via ORAL
  Filled 2015-08-13 (×3): qty 2

## 2015-08-13 MED ORDER — BISACODYL 5 MG PO TBEC: 10.0000 mg | DELAYED_RELEASE_TABLET | Freq: Every day | ORAL | Status: DC | PRN

## 2015-08-13 MED ORDER — TRAMADOL HCL 50 MG PO TABS: 50.0000 mg | ORAL_TABLET | Freq: Four times a day (QID) | ORAL | Status: DC | PRN

## 2015-08-13 MED ORDER — DEXTROSE 5 % IV SOLN
1.0000 g | INTRAVENOUS | Status: DC
  Administered 2015-08-14 – 2015-08-15 (×2): 1 g via INTRAVENOUS
  Filled 2015-08-13 (×2): qty 10

## 2015-08-13 MED ORDER — DEXTROSE 5 % IV SOLN
INTRAVENOUS | Status: AC
  Filled 2015-08-13: qty 500

## 2015-08-13 MED ORDER — OMEGA-3-ACID ETHYL ESTERS 1 G PO CAPS
1.0000 g | ORAL_CAPSULE | Freq: Every day | ORAL | Status: DC
  Administered 2015-08-13 – 2015-08-16 (×4): 1 g via ORAL
  Filled 2015-08-13 (×4): qty 1

## 2015-08-13 MED ORDER — IPRATROPIUM-ALBUTEROL 0.5-2.5 (3) MG/3ML IN SOLN
3.0000 mL | RESPIRATORY_TRACT | Status: DC
  Administered 2015-08-13 – 2015-08-15 (×10): 3 mL via RESPIRATORY_TRACT
  Filled 2015-08-13 (×11): qty 3

## 2015-08-13 MED ORDER — CALCIUM ACETATE (PHOS BINDER) 667 MG/5ML PO SOLN
1334.0000 mg | Freq: Three times a day (TID) | ORAL | Status: DC
  Administered 2015-08-14 – 2015-08-16 (×6): 1334 mg via ORAL
  Filled 2015-08-13 (×11): qty 10

## 2015-08-13 NOTE — ED Provider Notes (Signed)
[facility] Emergency Department Provider Note  ____________________________________________    I have reviewed the triage vital signs and the nursing notes.   HISTORY  Chief Complaint Shortness of Breath and Influenza    HPI [name] is a 82 y.o. female who presents with complaints of shortness of breath and cough. Per daughter patient was diagnosed with influenza A on Friday by her PCP. Over the weekend the patient developed a productive cough and increased shortness of breath. Patient does use oxygen at night but has been having to use it during the day. Patient also has end-stage renal disease and had dialysis on Saturday.She has complained of chills as well.     Past Medical History  Diagnosis Date  . Chronic kidney disease   . Dysrhythmia   . Diabetes mellitus without complication (HCC)   . Hypertension   . Stroke (HCC)   . Arthritis     gout  . Dialysis patient (HCC)   . Pleural effusion   . Pulmonary hypertension (HCC)   . Renal insufficiency   . CHF (congestive heart failure) (HCC)   . Peripheral vascular disease (HCC)   . Shortness of breath dyspnea     with exertion  . GERD (gastroesophageal reflux disease)   . Anemia   . Restless leg syndrome     Patient Active Problem List   Diagnosis Date Noted  . Carotid stenosis 07/26/2015  . Amputation of fifth toe, left, traumatic (HCC) 06/24/2015  . Diabetes mellitus (HCC) 06/23/2015  . Diabetic neuropathy (HCC) 06/23/2015  . Chronic atrial fibrillation (HCC) 06/23/2015  . Left carotid artery stenosis 06/23/2015  . CVA (cerebral infarction) 06/21/2015  . Pressure ulcer 06/19/2015  . End stage renal disease (HCC) 06/18/2015  . Respiratory distress 12/11/2014    Past Surgical History  Procedure Laterality Date  . Cholecystectomy    . Peripheral vascular catheterization N/A 10/08/2014    Procedure: A/V Shuntogram/Fistulagram;  Surgeon: Annice Needy, MD;  Location: [facility] INVASIVE  CV LAB;  Service: Cardiovascular;  Laterality: N/A;  . Joint replacement      bilateral hip  . Peripheral vascular catheterization N/A 03/04/2015    Procedure: A/V Shuntogram/Fistulagram;  Surgeon: Annice Needy, MD;  Location: [facility] INVASIVE CV LAB;  Service: Cardiovascular;  Laterality: N/A;  . Peripheral vascular catheterization N/A 03/04/2015    Procedure: A/V Shunt Intervention;  Surgeon: Annice Needy, MD;  Location: [facility] INVASIVE CV LAB;  Service: Cardiovascular;  Laterality: N/A;  . Fistulagram ([facility] hx)    . Parathyroidectomy    . Tonsillectomy    . Cardiac catheterization    . Eye surgery    . Cataract extraction, bilateral    . Amputation toe Left 06/07/2015    Procedure: AMPUTATION TOE;  Surgeon: Gwyneth Revels, DPM;  Location: [facility] ORS;  Service: Podiatry;  Laterality: Left;  Marland Kitchen [employer] bladder interstem      currently turned off for MRI in January  . Endarterectomy Left 07/26/2015    Procedure: ENDARTERECTOMY CAROTID;  Surgeon: Renford Dills, MD;  Location: [facility] ORS;  Service: Vascular;  Laterality: Left;  . Thyroidectomy, partial      Current Outpatient Rx  Name  Route  Sig  Dispense  Refill  . acetaminophen (TYLENOL) 500 MG tablet   Oral   Take 1,000 mg by mouth 2 (two) times daily.          Marland Kitchen aspirin 81 MG chewable tablet   Oral   Chew 1 tablet (81  mg total) by mouth daily.   30 tablet   6   . Biotin 1 MG CAPS   Oral   Take 1 mg by mouth daily.          . bisacodyl (DULCOLAX) 5 MG EC tablet   Oral   Take 10 mg by mouth daily as needed for moderate constipation.          . calcium acetate, Phos Binder, (PHOSLYRA) 667 MG/5ML SOLN   Oral   Take 1,334 mg by mouth 3 (three) times daily with meals.         Marland Kitchen diltiazem (TIAZAC) 120 MG 24 hr capsule   Oral   Take 120 mg by mouth daily.         . diphenhydrAMINE (BENADRYL) 25 mg capsule   Oral   Take 1 capsule (25 mg total) by mouth every 6 (six) hours as needed (rash, itching).   30 capsule   0   .  folic acid-vitamin b complex-vitamin c-selenium-zinc (DIALYVITE) 3 MG TABS tablet   Oral   Take 1 tablet by mouth daily.         . furosemide (LASIX) 80 MG tablet   Oral   Take 80 mg by mouth 2 (two) times daily.         Marland Kitchen gabapentin (NEURONTIN) 100 MG capsule   Oral   Take 100 mg by mouth at bedtime. Pt takes on Sunday, Monday, Wednesday, and Friday.         . gabapentin (NEURONTIN) 300 MG capsule   Oral   Take 300 mg by mouth at bedtime. Pt takes on Tuesday, Thursday, and Saturday.         . Insulin Detemir (LEVEMIR) 100 UNIT/ML Pen   Subcutaneous   Inject 11 Units into the skin 2 (two) times daily. 0730, 1730         . lidocaine-prilocaine (EMLA) cream   Topical   Apply 1 application topically as needed (prior to accessing port).          . Omega-3 Fatty Acids (FISH OIL) 1200 MG CAPS   Oral   Take 1,200 mg by mouth daily.          Marland Kitchen PARoxetine (PAXIL) 40 MG tablet   Oral   Take 40 mg by mouth at bedtime.          . pravastatin (PRAVACHOL) 20 MG tablet   Oral   Take 1 tablet (20 mg total) by mouth daily.   30 tablet   5   . ranitidine (ZANTAC) 150 MG tablet   Oral   Take 150 mg by mouth at bedtime.         . senna-docusate (SENOKOT-S) 8.6-50 MG tablet   Oral   Take 1 tablet by mouth 2 (two) times daily.         . traMADol (ULTRAM) 50 MG tablet   Oral   Take 1 tablet (50 mg total) by mouth every 6 (six) hours as needed for moderate pain.   30 tablet   0   . warfarin (COUMADIN) 3 MG tablet   Oral   Take 1.5 mg by mouth daily. Everyday except Mondays           Allergies Codeine; Contrast media; Oxycodone; and Quinine derivatives  No family history on file.  Social History Social History  Substance Use Topics  . Smoking status: Never Smoker   . Smokeless tobacco: Never Used  . Alcohol Use: No  Review of Systems  Constitutional: Positive for chills Eyes: Negative for redness ENT: Negative for neck pain, recent  endarterectomy Cardiovascular: Negative for chest pain Respiratory: Positive for shortness of breath and cough Gastrointestinal: Negative for vomiting or diarrhea Genitourinary: Negative for dysuria. Musculoskeletal: Negative for back pain. Skin: Negative for rash. Neurological: Negative for focal weakness Psychiatric: no anxiety    ____________________________________________   PHYSICAL EXAM:  VITAL SIGNS: ED Triage Vitals  Enc Vitals Group     BP 08/13/15 0811 148/98 mmHg     Pulse Rate 08/13/15 0811 87     Resp 08/13/15 0811 18     Temp 08/13/15 0811 98.3 F (36.8 C)     Temp Source 08/13/15 0811 Oral     SpO2 08/13/15 0811 90 %     Weight 08/13/15 0811 167 lb (75.751 kg)     Height 08/13/15 0811  (1.651 m)     Head Cir --      Peak Flow --      Pain Score 08/13/15 0807 0     Pain Loc --      Pain Edu? --      Excl. in GC? --      Constitutional: Alert and oriented. No acute distress, ill-appearing Eyes: Conjunctivae are normal. No erythema or injection ENT   Head: Normocephalic and atraumatic.   Mouth/Throat: Mucous membranes are moist. Cardiovascular: Normal rate, regular rhythm. Normal and symmetric distal pulses are present in the upper extremities. Respiratory: Normal respiratory effort without tachypnea nor retractions. Rales bilaterally, scattered wheezes Gastrointestinal: Soft and non-tender in all quadrants. No distention. There is no CVA tenderness. Genitourinary: deferred Musculoskeletal: Nontender with normal range of motion in all extremities. Neurologic:  Normal speech and language. No gross focal neurologic deficits are appreciated. Skin:  Skin is warm, dry and intact. No rash noted. Psychiatric: Mood and affect are normal. Patient exhibits appropriate insight and judgment.  ____________________________________________    LABS (pertinent positives/negatives)  Labs Reviewed  CBC - Abnormal; Notable for the following:    WBC 15.2  (*)    RBC 3.63 (*)    Hemoglobin 11.4 (*)    HCT 34.0 (*)    RDW 15.8 (*)    All other components within normal limits  PROTIME-INR - Abnormal; Notable for the following:    Prothrombin Time 28.9 (*)    All other components within normal limits  CULTURE, BLOOD (ROUTINE X 2)  CULTURE, BLOOD (ROUTINE X 2)  COMPREHENSIVE METABOLIC PANEL  TROPONIN I    ____________________________________________   EKG  ED ECG REPORT I, Jene Every, the attending physician, personally viewed and interpreted this ECG.   Date: 08/13/2015  EKG Time: 9:14 AM  Rate: 119  Rhythm: atrial fibrillation, rate 119  Axis: Normal  Intervals:A. fib  ST&T Change: Nonspecific   ____________________________________________    RADIOLOGY  Chest x-ray concerning for left lower lobe pneumonia patient has persistent pleural effusions  ____________________________________________   PROCEDURES  Procedure(s) performed: none  Critical Care performed: none  ____________________________________________   INITIAL IMPRESSION / ASSESSMENT AND PLAN / ED COURSE  Pertinent labs & imaging results that were available during my care of the patient were reviewed by me and considered in my medical decision making (see chart for details).  Patient presents with increased oxygen requirement, elevated white blood cell count, new infiltrate on chest x-ray and worsening pleural effusions. She has multiple comorbidities. We will send blood cultures start antibiotics and admit her to the hospital for further  management of her pneumonia  ____________________________________________   FINAL CLINICAL IMPRESSION(S) / ED DIAGNOSES  Final diagnoses:  Community acquired pneumonia  Influenza A          Jene Everyobert Lynnlee Revels, MD 08/13/15 1039

## 2015-08-13 NOTE — Progress Notes (Deleted)
Patient discharged to home, Dr. Clint GuyHower ordered for a dry dressing to the right buttock, dressing supplies sent home with patient. IV discontinued to left forearm site without S/S of infiltration or infection.Pateint instructed to keep follow up appointment with Dr. Greggory StallionGeorge at 08/20/2015. Prescription given to patient as ordered.

## 2015-08-13 NOTE — Progress Notes (Signed)
Subjective:  Patient is admitted for management of pneumonia Last Friday, she saw Dr Gavin Potters and tested for Flu Resp status is worse She has cough and SOB [place] with Pneumonia   Objective:  Vital signs in last 24 hours:  Temp:  [98.2 F (36.8 C)-98.3 F (36.8 C)] 98.2 F (36.8 C) (03/14 1336) Pulse Rate:  [48-113] 74 (03/14 1336) Resp:  [18-25] 22 (03/14 1336) BP: (130-168)/(60-98) 130/77 mmHg (03/14 1336) SpO2:  [90 %-100 %] 100 % (03/14 1336) Weight:  [75.751 kg (167 lb)] 75.751 kg (167 lb) (03/14 0811)  Weight change:  Filed Weights   08/13/15 0811  Weight: 75.751 kg (167 lb)    Intake/Output:   No intake or output data in the 24 hours ending 08/13/15 1508   Physical Exam: General: NAD  HEENT anicteric  Neck supple  Pulm/lungs Coarse crackles b/l  CVS/Heart Irregular, no rub  Abdomen:  Soft, NT  Extremities: + edema  Neurologic: Alert, oriented  Skin: No acute rashes  Access: AVF       Basic Metabolic Panel:   Recent Labs Lab 08/13/15 0919  NA 135  K 3.2*  CL 97*  CO2 20*  GLUCOSE 60*  BUN 70*  CREATININE 5.47*  CALCIUM 8.3*     CBC:  Recent Labs Lab 08/13/15 0919  WBC 15.2*  HGB 11.4*  HCT 34.0*  MCV 93.5  PLT 341      Microbiology:  Recent Results (from the past 720 hour(s))  Surgical pcr screen     Status: None   Collection Time: 07/19/15 12:16 PM  Result Value Ref Range Status   MRSA, PCR NEGATIVE NEGATIVE Final   Staphylococcus aureus NEGATIVE NEGATIVE Final    Comment:        The Xpert SA Assay (FDA approved for NASAL specimens in patients over 74 years of age), is one component of a comprehensive surveillance program.  Test performance has been validated by [facility] for patients greater than or equal to 90 year old. It is not intended to diagnose infection nor to guide or monitor treatment.   MRSA PCR Screening     Status: None   Collection Time: 07/26/15  5:00 PM  Result Value Ref Range Status   MRSA by  PCR NEGATIVE NEGATIVE Final    Comment:        The GeneXpert MRSA Assay (FDA approved for NASAL specimens only), is one component of a comprehensive MRSA colonization surveillance program. It is not intended to diagnose MRSA infection nor to guide or monitor treatment for MRSA infections.     Coagulation Studies:  Recent Labs  08/13/15 0919  LABPROT 28.9*  INR 2.78    Urinalysis: No results for input(s): COLORURINE, LABSPEC, PHURINE, GLUCOSEU, HGBUR, BILIRUBINUR, KETONESUR, PROTEINUR, UROBILINOGEN, NITRITE, LEUKOCYTESUR in the last 72 hours.  Invalid input(s): APPERANCEUR    Imaging: Dg Chest 2 View  08/13/2015  CLINICAL DATA:  Diagnosed with the flu last Friday, dialysis patient, CHF, pleural effusion by prior chest radiograph, increased trouble breathing this morning diabetes mellitus, hypertension, stroke, GERD EXAM: CHEST  2 VIEW COMPARISON:  01/11/2015 FINDINGS: Enlargement of cardiac silhouette with pulmonary vascular congestion. Perihilar infiltrates likely representing mild pulmonary edema. Persistent LEFT pleural effusion with atelectasis versus consolidation in LEFT lower lobe, favor consolidation. Tiny RIGHT pleural effusion and minimal RIGHT basilar atelectasis also present. Underlying emphysematous changes. Atherosclerotic calcification aorta. No pneumothorax. Bones demineralized. BILATERAL glenohumeral degenerative changes. IMPRESSION: Enlargement of cardiac silhouette with pulmonary vascular congestion and question mild  pulmonary edema. Persistent LEFT pleural effusion and probable LEFT lower lobe infiltrate. New tiny RIGHT pleural effusion and minimal RIGHT basilar atelectasis. Electronically Signed   By: Ulyses SouthwardMark  Boles M.D.   On: 08/13/2015 08:41     Medications:     . acetaminophen  650 mg Oral BID  . aspirin  81 mg Oral Daily  . [START ON 08/14/2015] azithromycin  250 mg Intravenous Q24H  . calcium acetate (Phos Binder)  1,334 mg Oral TID WC  . cefTRIAXone  (ROCEPHIN)  IV  1 g Intravenous Q24H  . azithromycin (ZITHROMAX) 500 MG IVPB      . diltiazem  120 mg Oral Daily  . famotidine  10 mg Oral BID  . furosemide  80 mg Oral BID  . [START ON 08/14/2015] gabapentin  100 mg Oral Once per day on Sun Mon Wed Fri  . gabapentin  300 mg Oral Once per day on Tue Thu Sat  . insulin aspart  0-9 Units Subcutaneous TID WC  . insulin detemir  10 Units Subcutaneous BID  . ipratropium-albuterol  3 mL Nebulization Q4H  . multivitamin  1 tablet Oral Daily  . omega-3 acid ethyl esters  1 g Oral Daily  . PARoxetine  40 mg Oral QHS  . pravastatin  20 mg Oral Daily  . warfarin  1.5 mg Oral ONCE-1800  . Warfarin - Pharmacist Dosing Inpatient   Does not apply q1800   bisacodyl, diphenhydrAMINE, docusate sodium, senna-docusate, traMADol  Assessment/ Plan:  82 y.o. female with past medical history of atrial fibrillation on Coumadin, hypertension, gout, insulin-dependent diabetes mellitus type 2, diabetic neuropathy, CVA with right visual field blindness, hyperlipidemia, major depressive disorder, overactive bladder with incontinence, generalized anxiety disorder, congestive heart failure diastolic, parathyroidectomy, and osteoarthritis, s/p left CEA 07/26/15  [facility]   1. ESRD on HD TTHS:  - routine HD today - Short treatment  2. Anemia chronic kidney disease. hgb 11.4 - low dose EPO with HD if Hgb < 10.5  3. Secondary hyperparathyroidism.  - Monitor phos - appetite poor  4. Flu/Pneumonia -  As per IM team      LOS: 0 Kristen Fromm 3/14/20173:08 PM

## 2015-08-13 NOTE — Progress Notes (Signed)
Patient off floor to dialysis

## 2015-08-13 NOTE — Progress Notes (Signed)
ANTICOAGULATION CONSULT NOTE - Initial Consult  Pharmacy Consult for Warfarin  Indication: atrial fibrillation  Allergies  Allergen Reactions  . Codeine Other (See Comments)    Reaction:  Confusion/Hallucinations   . Contrast Media [Iodinated Diagnostic Agents] Other (See Comments)    "skin peel"  . Oxycodone Other (See Comments)    Reaction:  Confusion/Hallucinations   . Quinine Derivatives Rash    Patient Measurements: Height: 5\' 5"  (165.1 cm) Weight: 167 lb (75.751 kg) IBW/kg (Calculated) : 57   Vital Signs: Temp: 98.3 F (36.8 C) (03/14 0811) Temp Source: Oral (03/14 0811) BP: 149/60 mmHg (03/14 1030) Pulse Rate: 100 (03/14 1045)  Labs:  Recent Labs  08/13/15 0919  HGB 11.4*  HCT 34.0*  PLT 341  LABPROT 28.9*  INR 2.78  CREATININE 5.47*  TROPONINI 0.07*    Estimated Creatinine Clearance: 8.5 mL/min (by C-G formula based on Cr of 5.47).   Medical History: Past Medical History  Diagnosis Date  . Chronic kidney disease   . Dysrhythmia   . Diabetes mellitus without complication (HCC)   . Hypertension   . Stroke (HCC)   . Arthritis     gout  . Dialysis patient (HCC)   . Pleural effusion   . Pulmonary hypertension (HCC)   . Renal insufficiency   . CHF (congestive heart failure) (HCC)   . Peripheral vascular disease (HCC)   . Shortness of breath dyspnea     with exertion  . GERD (gastroesophageal reflux disease)   . Anemia   . Restless leg syndrome     Medications:  Scheduled:  . ipratropium-albuterol  3 mL Nebulization Q4H  . warfarin  1.5 mg Oral ONCE-1800  . Warfarin - Pharmacist Dosing Inpatient   Does not apply q1800    Assessment: Pharmacy to manage patient PTA therapy on Warfarin for Chronic AFib  Goal of Therapy:  INR 2-3 Monitor platelets by anticoagulation protocol: Yes   Plan:  Continue current home dose of 1.5 mg Daily Except Mondays and monitor INR.  Stefannie Defeo K 08/13/2015,11:31 AM

## 2015-08-13 NOTE — H&P (Signed)
[facility] [place] [facility]   PATIENT NAME: [name]    MR#:  [id number]  DATE OF BIRTH:  Oct 21, 1933  DATE OF ADMISSION:  08/13/2015  PRIMARY CARE PHYSICIAN: [name], MD   REQUESTING/REFERRING PHYSICIAN: Kinner  CHIEF COMPLAINT:   Chief Complaint  Patient presents with  . Shortness of Breath  . Influenza    HISTORY OF PRESENT ILLNESS: [name]  is a 82 y.o. female with a known history of chronic kidney disease on hemodialysis, diabetes, hypertension, stroke 2 months ago and was found to have significant blockage and left internal carotid artery so had endarterectomy done on 25th of February, pulmonary hypertension, congestive heart failure, peripheral vascular disease, atrial fibrillation on Coumadin- started feeling some cough and generalized weakness for almost a week, concerned with this she went to her primary care doctor last week and he checked for influenza which was positive for influenza a strain, also did a chest x-ray which did not show any pneumonia, so he suggested just to take a lot of liquids and take some rest. She continued to feel worse and started to get short of breath, and has to use oxygen continuously instead of just using at night at her baseline, so daughter brought her to emergency room today. In ER she is noted to be hypoxic, elevated white cell count, and bilateral lower lobe pneumonia. So given his admission to hospitalist team.  PAST MEDICAL HISTORY:   Past Medical History  Diagnosis Date  . Chronic kidney disease   . Dysrhythmia   . Diabetes mellitus without complication (HCC)   . Hypertension   . Stroke (HCC)   . Arthritis     gout  . Dialysis patient (HCC)   . Pleural effusion   . Pulmonary hypertension (HCC)   . Renal insufficiency   . CHF (congestive heart failure) (HCC)   . Peripheral vascular disease (HCC)   . Shortness of breath dyspnea     with exertion  . GERD (gastroesophageal reflux  disease)   . Anemia   . Restless leg syndrome     PAST SURGICAL HISTORY: Past Surgical History  Procedure Laterality Date  . Cholecystectomy    . Peripheral vascular catheterization N/A 10/08/2014    Procedure: A/V Shuntogram/Fistulagram;  Surgeon: Annice Needy, MD;  Location: [facility] INVASIVE CV LAB;  Service: Cardiovascular;  Laterality: N/A;  . Joint replacement      bilateral hip  . Peripheral vascular catheterization N/A 03/04/2015    Procedure: A/V Shuntogram/Fistulagram;  Surgeon: Annice Needy, MD;  Location: [facility] INVASIVE CV LAB;  Service: Cardiovascular;  Laterality: N/A;  . Peripheral vascular catheterization N/A 03/04/2015    Procedure: A/V Shunt Intervention;  Surgeon: Annice Needy, MD;  Location: [facility] INVASIVE CV LAB;  Service: Cardiovascular;  Laterality: N/A;  . Fistulagram ([facility] hx)    . Parathyroidectomy    . Tonsillectomy    . Cardiac catheterization    . Eye surgery    . Cataract extraction, bilateral    . Amputation toe Left 06/07/2015    Procedure: AMPUTATION TOE;  Surgeon: Gwyneth Revels, DPM;  Location: [facility] ORS;  Service: Podiatry;  Laterality: Left;  Marland Kitchen [employer] bladder interstem      currently turned off for MRI in January  . Endarterectomy Left 07/26/2015    Procedure: ENDARTERECTOMY CAROTID;  Surgeon: Renford Dills, MD;  Location: [facility] ORS;  Service: Vascular;  Laterality: Left;  . Thyroidectomy, partial      SOCIAL  HISTORY:  Social History  Substance Use Topics  . Smoking status: Never Smoker   . Smokeless tobacco: Never Used  . Alcohol Use: No    FAMILY HISTORY:  Family History  Problem Relation Age of Onset  . Colon cancer Mother   . Stroke Father   . Diabetes Brother     DRUG ALLERGIES:  Allergies  Allergen Reactions  . Codeine Other (See Comments)    Reaction:  Confusion/Hallucinations   . Contrast Media [Iodinated Diagnostic Agents] Other (See Comments)    "skin peel"  . Oxycodone Other (See Comments)    Reaction:   Confusion/Hallucinations   . Quinine Derivatives Rash    REVIEW OF SYSTEMS:   CONSTITUTIONAL: positive for fever, fatigue or weakness.  EYES: No blurred or double vision.  EARS, NOSE, AND THROAT: No tinnitus or ear pain.  RESPIRATORY: positive for cough, shortness of breath,no wheezing or hemoptysis.  CARDIOVASCULAR: No chest pain, orthopnea, edema.  GASTROINTESTINAL: No nausea, vomiting, diarrhea or abdominal pain.  GENITOURINARY: No dysuria, hematuria.  ENDOCRINE: No polyuria, nocturia,  HEMATOLOGY: No anemia, easy bruising or bleeding SKIN: No rash or lesion. MUSCULOSKELETAL: No joint pain or arthritis.   NEUROLOGIC: No tingling, numbness, weakness.  PSYCHIATRY: No anxiety or depression.   MEDICATIONS AT HOME:  Prior to Admission medications   Medication Sig Start Date End Date Taking? Authorizing Provider  acetaminophen (TYLENOL) 650 MG CR tablet Take 1,300 mg by mouth 2 (two) times daily.   Yes Historical Provider, MD  albuterol (PROVENTIL HFA;VENTOLIN HFA) 108 (90 Base) MCG/ACT inhaler Inhale 1-2 puffs into the lungs every 6 (six) hours as needed for wheezing or shortness of breath.   Yes Historical Provider, MD  aspirin 81 MG chewable tablet Chew 1 tablet (81 mg total) by mouth daily. 06/24/15  Yes Katharina Caperima Vaickute, MD  Biotin 1 MG CAPS Take 1 mg by mouth daily.    Yes Historical Provider, MD  bisacodyl (DULCOLAX) 5 MG EC tablet Take 10 mg by mouth daily as needed for moderate constipation.    Yes Historical Provider, MD  calcium acetate, Phos Binder, (PHOSLYRA) 667 MG/5ML SOLN Take 1,334 mg by mouth 3 (three) times daily with meals.   Yes Historical Provider, MD  diltiazem (TIAZAC) 120 MG 24 hr capsule Take 120 mg by mouth daily.   Yes Historical Provider, MD  diphenhydrAMINE (BENADRYL) 25 mg capsule Take 1 capsule (25 mg total) by mouth every 6 (six) hours as needed (rash, itching). 06/24/15  Yes Katharina Caperima Vaickute, MD  docusate sodium (COLACE) 100 MG capsule Take 200 mg by mouth 2  (two) times daily as needed for mild constipation.   Yes Historical Provider, MD  folic acid-vitamin b complex-vitamin c-selenium-zinc (DIALYVITE) 3 MG TABS tablet Take 1 tablet by mouth daily.   Yes Historical Provider, MD  furosemide (LASIX) 80 MG tablet Take 80 mg by mouth 2 (two) times daily.   Yes Historical Provider, MD  gabapentin (NEURONTIN) 100 MG capsule Take 100 mg by mouth at bedtime. Pt takes on Sunday, Monday, Wednesday, and Friday.   Yes Historical Provider, MD  gabapentin (NEURONTIN) 300 MG capsule Take 300 mg by mouth daily. Pt takes on Tuesday, Thursday, and Saturday after dialysis.   Yes Historical Provider, MD  Insulin Detemir (LEVEMIR) 100 UNIT/ML Pen Inject 11 Units into the skin 2 (two) times daily. 0730, 1730   Yes Historical Provider, MD  lidocaine-prilocaine (EMLA) cream Apply 1 application topically as needed (prior to accessing port).    Yes Historical Provider,  MD  nystatin (MYCOSTATIN/NYSTOP) 100000 UNIT/GM POWD Apply topically 2 (two) times daily as needed.   Yes Historical Provider, MD  Omega-3 Fatty Acids (FISH OIL CONCENTRATE) 300 MG CAPS Take 1 capsule by mouth daily. 11/04/10  Yes Historical Provider, MD  PARoxetine (PAXIL) 40 MG tablet Take 40 mg by mouth at bedtime.    Yes Historical Provider, MD  pravastatin (PRAVACHOL) 20 MG tablet Take 1 tablet (20 mg total) by mouth daily. 07/27/15  Yes Kimberly A Stegmayer, PA-C  ranitidine (ZANTAC) 150 MG tablet Take 150 mg by mouth 2 (two) times daily.    Yes Historical Provider, MD  senna-docusate (SENOKOT-S) 8.6-50 MG tablet Take 1 tablet by mouth 2 (two) times daily as needed.    Yes Historical Provider, MD  traMADol (ULTRAM) 50 MG tablet Take 1 tablet (50 mg total) by mouth every 6 (six) hours as needed for moderate pain. 07/27/15  Yes Kimberly A Stegmayer, PA-C  warfarin (COUMADIN) 3 MG tablet Take 1.5 mg by mouth daily. Everyday except Mondays   Yes Historical Provider, MD  acetaminophen (TYLENOL) 500 MG tablet Take 1,000  mg by mouth 2 (two) times daily.     Historical Provider, MD      PHYSICAL EXAMINATION:   VITAL SIGNS: Blood pressure 149/60, pulse 100, temperature 98.3 F (36.8 C), temperature source Oral, resp. rate 21, height  (1.651 m), weight 75.751 kg (167 lb), SpO2 95 %.  GENERAL:  82 y.o.-year-old patient lying in the bed with no acute distress.  EYES: Pupils equal, round, reactive to light and accommodation. No scleral icterus. Extraocular muscles intact.  HEENT: Head atraumatic, normocephalic. Oropharynx and nasopharynx clear.  NECK:  Supple, no jugular venous distention. No thyroid enlargement, no tenderness.  LUNGS: Normal breath sounds bilaterally, no wheezing, diffuse crepitation. No use of accessory muscles of respiration. Requiring supplemental oxygen. CARDIOVASCULAR: S1, S2 normal. No murmurs, rubs, or gallops.  ABDOMEN: Soft, nontender, nondistended. Bowel sounds present. No organomegaly or mass.  EXTREMITIES: No pedal edema, cyanosis, or clubbing.  NEUROLOGIC: Cranial nerves II through XII are intact. Muscle strength 5/5 in all extremities. Sensation intact. Gait not checked.  PSYCHIATRIC: The patient is alert and oriented x 3.  SKIN: No obvious rash, lesion, or ulcer.   LABORATORY PANEL:   CBC  Recent Labs Lab 08/13/15 0919  WBC 15.2*  HGB 11.4*  HCT 34.0*  PLT 341  MCV 93.5  MCH 31.3  MCHC 33.5  RDW 15.8*   ------------------------------------------------------------------------------------------------------------------  Chemistries   Recent Labs Lab 08/13/15 0919  NA 135  K 3.2*  CL 97*  CO2 20*  GLUCOSE 60*  BUN 70*  CREATININE 5.47*  CALCIUM 8.3*  AST 22  ALT 8*  ALKPHOS 88  BILITOT 1.6*   ------------------------------------------------------------------------------------------------------------------ estimated creatinine clearance is 8.5 mL/min (by C-G formula based on Cr of  5.47). ------------------------------------------------------------------------------------------------------------------ No results for input(s): TSH, T4TOTAL, T3FREE, THYROIDAB in the last 72 hours.  Invalid input(s): FREET3   Coagulation profile  Recent Labs Lab 08/13/15 0919  INR 2.78   ------------------------------------------------------------------------------------------------------------------- No results for input(s): DDIMER in the last 72 hours. -------------------------------------------------------------------------------------------------------------------  Cardiac Enzymes  Recent Labs Lab 08/13/15 0919  TROPONINI 0.07*   ------------------------------------------------------------------------------------------------------------------ Invalid input(s): POCBNP  ---------------------------------------------------------------------------------------------------------------  Urinalysis No results found for: COLORURINE, APPEARANCEUR, LABSPEC, PHURINE, GLUCOSEU, HGBUR, BILIRUBINUR, KETONESUR, PROTEINUR, UROBILINOGEN, NITRITE, LEUKOCYTESUR   RADIOLOGY: Dg Chest 2 View  08/13/2015  CLINICAL DATA:  Diagnosed with the flu last Friday, dialysis patient, CHF, pleural effusion by prior chest radiograph, increased trouble  breathing this morning diabetes mellitus, hypertension, stroke, GERD EXAM: CHEST  2 VIEW COMPARISON:  01/11/2015 FINDINGS: Enlargement of cardiac silhouette with pulmonary vascular congestion. Perihilar infiltrates likely representing mild pulmonary edema. Persistent LEFT pleural effusion with atelectasis versus consolidation in LEFT lower lobe, favor consolidation. Tiny RIGHT pleural effusion and minimal RIGHT basilar atelectasis also present. Underlying emphysematous changes. Atherosclerotic calcification aorta. No pneumothorax. Bones demineralized. BILATERAL glenohumeral degenerative changes. IMPRESSION: Enlargement of cardiac silhouette with pulmonary  vascular congestion and question mild pulmonary edema. Persistent LEFT pleural effusion and probable LEFT lower lobe infiltrate. New tiny RIGHT pleural effusion and minimal RIGHT basilar atelectasis. Electronically Signed   By: Ulyses Southward M.D.   On: 08/13/2015 08:41    EKG: Orders placed or performed during the hospital encounter of 08/13/15  . EKG 12-Lead  . EKG 12-Lead    IMPRESSION AND PLAN:  * Bilateral pneumonia   we'll treat with IV Rocephin and azithromycin, collect sputum culture, blood culture is already sent by ER.    to follow-up with clinical improvement as she is on dialysis and had surgery 3 weeks ago, she is at high risk of getting hospital-acquired pneumonia ,if she does not improve we might have to give her broader antibiotics.  * Acute on chronic respiratory  Uses oxygen at night at home.   Continue supplemental oxygen here.   Given nebulizer therapy.  * History of renal disease on hemodialysis   Nephrology consult to help with dialysis, today is her regular dialysis today.  * Atrial fibrillation  She is on Coumadin heart rate is under control, pharmacy to help maintain Coumadin dose.  * Recent stroke from which she completely recovered, and had left-sided endarterectomy on carotid artery   Continue her home medications.  * Diabetes  Continue basal insulin and keep on insulin sliding scale coverage.  All the records are reviewed and case discussed with ED provider. Management plans discussed with the patient, family and they are in agreement.  CODE STATUS: Code Status History    Date Active Date Inactive Code Status Order ID Comments User Context   06/18/2015 11:41 PM 06/25/2015  1:04 AM DNR [id number]  Ramonita Lab, MD Inpatient   06/07/2015  3:46 PM 06/07/2015  6:48 PM Full Code [id number]  Gwyneth Revels, DPM Inpatient   12/11/2014 10:46 PM 12/13/2014  9:38 PM DNR [id number]  Enedina Finner, MD Inpatient   12/11/2014  9:10 PM 12/11/2014 10:46 PM DNR [id number]  Enedina Finner,  MD ED    Questions for Most Recent Historical Code Status (Order [id number])    Question Answer Comment   In the event of cardiac or respiratory ARREST Do not call a "code blue"    In the event of cardiac or respiratory ARREST Do not perform Intubation, CPR, defibrillation or ACLS    In the event of cardiac or respiratory ARREST Use medication by any route, position, wound care, and other measures to relive pain and suffering. May use oxygen, suction and manual treatment of airway obstruction as needed for comfort.    Comments RN may pronounce        TOTAL TIME TAKING CARE OF THIS PATIENT: 50 minutes.    Altamese Dilling M.D on 08/13/2015   Between 7am to 6pm - Pager - [phone]  After 6pm go to [email or web address] - password [email or web address] [facility]  [facility]  Office  [phone]  CC: Primary care physician; [name], MD   Note: This dictation was prepared with Dragon dictation along with smaller phrase  technology. Any transcriptional errors that result from this process are unintentional.

## 2015-08-13 NOTE — ED Notes (Signed)
Family states that she was dx'd with flu last week.. Thinks her O2 sat's are to low  On arrival to ed resp even and non labored

## 2015-08-13 NOTE — Progress Notes (Signed)
Tx ended    

## 2015-08-13 NOTE — Progress Notes (Signed)
Post Hd, access achieved hemostasis, monitor for bleeding, remove guaze in four hour.  Pt Tolerated tx, pt stable

## 2015-08-13 NOTE — ED Notes (Signed)
Caregiver says pt has flu, and now they cant get ox up even with oxygen

## 2015-08-14 LAB — BASIC METABOLIC PANEL
Anion gap: 13 (ref 5–15)
BUN: 39 mg/dL — ABNORMAL HIGH (ref 6–20)
CALCIUM: 7.8 mg/dL — AB (ref 8.9–10.3)
CO2: 26 mmol/L (ref 22–32)
Chloride: 97 mmol/L — ABNORMAL LOW (ref 101–111)
Creatinine, Ser: 3.8 mg/dL — ABNORMAL HIGH (ref 0.44–1.00)
GFR calc Af Amer: 12 mL/min — ABNORMAL LOW (ref >60)
GFR, EST NON AFRICAN AMERICAN: 10 mL/min — AB (ref >60)
GLUCOSE: 110 mg/dL — AB (ref 65–99)
POTASSIUM: 2.8 mmol/L — AB (ref 3.5–5.1)
Sodium: 136 mmol/L (ref 135–145)

## 2015-08-14 LAB — GLUCOSE, CAPILLARY
GLUCOSE-CAPILLARY: 101 mg/dL — AB (ref 65–99)
GLUCOSE-CAPILLARY: 131 mg/dL — AB (ref 65–99)
Glucose-Capillary: 380 mg/dL — ABNORMAL HIGH (ref 65–99)
Glucose-Capillary: 107 mg/dL — ABNORMAL HIGH (ref 65–99)

## 2015-08-14 LAB — CBC
HEMATOCRIT: 32.4 % — AB (ref 35.0–47.0)
HEMOGLOBIN: 10.7 g/dL — AB (ref 12.0–16.0)
MCH: 30.8 pg (ref 26.0–34.0)
MCHC: 33.2 g/dL (ref 32.0–36.0)
MCV: 92.7 fL (ref 80.0–100.0)
Platelets: 320 10*3/uL (ref 150–440)
RBC: 3.49 MIL/uL — ABNORMAL LOW (ref 3.80–5.20)
RDW: 15.3 % — AB (ref 11.5–14.5)
WBC: 11 10*3/uL (ref 3.6–11.0)

## 2015-08-14 LAB — PROTIME-INR
INR: 2.45
Prothrombin Time: 26.3 seconds — ABNORMAL HIGH (ref 11.4–15.0)

## 2015-08-14 MED ORDER — POTASSIUM CHLORIDE CRYS ER 20 MEQ PO TBCR
40.0000 meq | EXTENDED_RELEASE_TABLET | Freq: Once | ORAL | Status: AC
  Administered 2015-08-14: 40 meq via ORAL
  Filled 2015-08-14: qty 2

## 2015-08-14 MED ORDER — ALBUMIN HUMAN 25 % IV SOLN
12.5000 g | Freq: Once | INTRAVENOUS | Status: AC
  Administered 2015-08-15: 12.5 g via INTRAVENOUS
  Filled 2015-08-14 (×2): qty 50

## 2015-08-14 MED ORDER — POTASSIUM CHLORIDE CRYS ER 20 MEQ PO TBCR: 40.0000 meq | EXTENDED_RELEASE_TABLET | ORAL | Status: DC

## 2015-08-14 MED ORDER — WARFARIN SODIUM 1 MG PO TABS
1.5000 mg | ORAL_TABLET | ORAL | Status: DC
  Administered 2015-08-14: 1.5 mg via ORAL
  Filled 2015-08-14: qty 2

## 2015-08-14 MED ORDER — TRAMADOL HCL 50 MG PO TABS
50.0000 mg | ORAL_TABLET | Freq: Two times a day (BID) | ORAL | Status: DC | PRN
  Administered 2015-08-16: 50 mg via ORAL
  Filled 2015-08-14 (×2): qty 1

## 2015-08-14 NOTE — Progress Notes (Signed)
ANTICOAGULATION CONSULT NOTE - Follow up Consult  Pharmacy Consult for Warfarin  Indication: atrial fibrillation  Allergies  Allergen Reactions  . Codeine Other (See Comments)    Reaction:  Confusion/Hallucinations   . Contrast Media [Iodinated Diagnostic Agents] Other (See Comments)    "skin peel"  . Oxycodone Other (See Comments)    Reaction:  Confusion/Hallucinations   . Quinine Derivatives Rash    Patient Measurements: Height:  (165.1 cm) Weight: 161 lb 13.1 oz (73.4 kg) IBW/kg (Calculated) : 57   Vital Signs: Temp: 99.1 F (37.3 C) (03/14 2051) Temp Source: Oral (03/14 2051) BP: 144/79 mmHg (03/15 0549) Pulse Rate: 113 (03/15 0549)  Labs:  Recent Labs  08/13/15 0919 08/13/15 1333 08/13/15 1607 08/13/15 1835 08/14/15 0414  HGB 11.4*  --   --   --  10.7*  HCT 34.0*  --   --   --  32.4*  PLT 341  --   --   --  320  LABPROT 28.9*  --   --   --  26.3*  INR 2.78  --   --   --  2.45  CREATININE 5.47*  --   --   --  3.80*  TROPONINI 0.07* 0.06* 0.07* 0.07*  --     Estimated Creatinine Clearance: 12.1 mL/min (by C-G formula based on Cr of 3.8).   Medical History: Past Medical History  Diagnosis Date  . Chronic kidney disease   . Dysrhythmia   . Diabetes mellitus without complication (HCC)   . Hypertension   . Stroke (HCC)   . Arthritis     gout  . Dialysis patient (HCC)   . Pleural effusion   . Pulmonary hypertension (HCC)   . Renal insufficiency   . CHF (congestive heart failure) (HCC)   . Peripheral vascular disease (HCC)   . Shortness of breath dyspnea     with exertion  . GERD (gastroesophageal reflux disease)   . Anemia   . Restless leg syndrome   . Atrial fibrillation (HCC)     Medications:  Scheduled:  . acetaminophen  650 mg Oral BID  . aspirin  81 mg Oral Daily  . azithromycin  250 mg Intravenous Q24H  . calcium acetate (Phos Binder)  1,334 mg Oral TID WC  . cefTRIAXone (ROCEPHIN)  IV  1 g Intravenous Q24H  . diltiazem  120 mg  Oral Daily  . famotidine  10 mg Oral BID  . furosemide  80 mg Oral BID  . gabapentin  100 mg Oral Once per day on Sun Mon Wed Fri  . gabapentin  300 mg Oral Once per day on Tue Thu Sat  . insulin aspart  0-9 Units Subcutaneous TID WC  . insulin detemir  10 Units Subcutaneous BID  . ipratropium-albuterol  3 mL Nebulization Q4H  . multivitamin  1 tablet Oral Daily  . omega-3 acid ethyl esters  1 g Oral Daily  . PARoxetine  40 mg Oral QHS  . pravastatin  20 mg Oral Daily  . warfarin  1.5 mg Oral ONCE-1800  . Warfarin - Pharmacist Dosing Inpatient   Does not apply q1800    Assessment: Pharmacy to manage patientWarfarin for Chronic AFib.  PTA dose:  Warfarin 1.5 mg daily Except Monday.  3/14  INR:  2.78    ?Warfarin not charted as being given 3/15  INR:  2.45  Goal of Therapy:  INR 2-3 Monitor platelets by anticoagulation protocol: Yes   Plan:  INR therapeutic.  Dose last night 3/14, not charted on MAR as given.  Will continue current home dose of Warfarin 1.5 mg Daily Except Mondays and monitor INR. Patient on Azithromycin/Ceftriaxone.  [name] 08/14/2015,8:09 AM

## 2015-08-14 NOTE — Progress Notes (Signed)
Notified Dr.Diamond of pt critical potassium of 2.8. Acknowledged and orders to be put in

## 2015-08-14 NOTE — Progress Notes (Signed)
[facility] [place] [facility]   PATIENT NAME: [name]    MR#:  [id number]  DATE OF BIRTH:  Oct 07, 1933  SUBJECTIVE:  CHIEF COMPLAINT:   Chief Complaint  Patient presents with  . Shortness of Breath  . Influenza   Shortness of breath and cough better. Afebrile. On 2 L oxygen.  REVIEW OF SYSTEMS:    Review of Systems  Constitutional: Positive for chills and malaise/fatigue. Negative for fever.  HENT: Negative for sore throat.   Eyes: Negative for blurred vision, double vision and pain.  Respiratory: Positive for cough and shortness of breath. Negative for hemoptysis and wheezing.   Cardiovascular: Negative for chest pain, palpitations, orthopnea and leg swelling.  Gastrointestinal: Negative for heartburn, nausea, vomiting, abdominal pain, diarrhea and constipation.  Genitourinary: Negative for dysuria and hematuria.  Musculoskeletal: Positive for myalgias. Negative for back pain and joint pain.  Skin: Negative for rash.  Neurological: Positive for weakness. Negative for sensory change, speech change, focal weakness and headaches.  Endo/Heme/Allergies: Does not bruise/bleed easily.  Psychiatric/Behavioral: Negative for depression. The patient is not nervous/anxious.     DRUG ALLERGIES:   Allergies  Allergen Reactions  . Codeine Other (See Comments)    Reaction:  Confusion/Hallucinations   . Contrast Media [Iodinated Diagnostic Agents] Other (See Comments)    "skin peel"  . Oxycodone Other (See Comments)    Reaction:  Confusion/Hallucinations   . Quinine Derivatives Rash    VITALS:  Blood pressure 144/79, pulse 113, temperature 99.1 F (37.3 C), temperature source Oral, resp. rate 18, height 5\' 5"  (1.651 m), weight 73.4 kg (161 lb 13.1 oz), SpO2 98 %.  PHYSICAL EXAMINATION:   Physical Exam  GENERAL:  82 y.o.-year-old patient lying in the bed with no acute distress.  EYES: Pupils equal, round, reactive to light and accommodation.  No scleral icterus. Extraocular muscles intact.  HEENT: Head atraumatic, normocephalic. Oropharynx and nasopharynx clear.  NECK:  Supple, no jugular venous distention. No thyroid enlargement, no tenderness.  LUNGS: Normal work of breathing with bilateral rhonchi. No use of accessory muscles  CARDIOVASCULAR: S1, S2 normal. No murmurs, rubs, or gallops.  ABDOMEN: Soft, nontender, nondistended. Bowel sounds present. No organomegaly or mass.  EXTREMITIES: No cyanosis, clubbing or edema b/l.    NEUROLOGIC: Cranial nerves II through XII are intact. No focal Motor or sensory deficits b/l.   PSYCHIATRIC: The patient is alert and oriented x 3.  SKIN: No obvious rash, lesion, or ulcer.   LABORATORY PANEL:   CBC  Recent Labs Lab 08/14/15 0414  WBC 11.0  HGB 10.7*  HCT 32.4*  PLT 320   ------------------------------------------------------------------------------------------------------------------ Chemistries   Recent Labs Lab 08/13/15 0919 08/14/15 0414  NA 135 136  K 3.2* 2.8*  CL 97* 97*  CO2 20* 26  GLUCOSE 60* 110*  BUN 70* 39*  CREATININE 5.47* 3.80*  CALCIUM 8.3* 7.8*  AST 22  --   ALT 8*  --   ALKPHOS 88  --   BILITOT 1.6*  --    ------------------------------------------------------------------------------------------------------------------  Cardiac Enzymes  Recent Labs Lab 08/13/15 1835  TROPONINI 0.07*   ------------------------------------------------------------------------------------------------------------------  RADIOLOGY:  Dg Chest 2 View  08/13/2015  CLINICAL DATA:  Diagnosed with the flu last Friday, dialysis patient, CHF, pleural effusion by prior chest radiograph, increased trouble breathing this morning diabetes mellitus, hypertension, stroke, GERD EXAM: CHEST  2 VIEW COMPARISON:  01/11/2015 FINDINGS: Enlargement of cardiac silhouette with pulmonary vascular congestion. Perihilar infiltrates likely representing mild pulmonary edema.  Persistent LEFT  pleural effusion with atelectasis versus consolidation in LEFT lower lobe, favor consolidation. Tiny RIGHT pleural effusion and minimal RIGHT basilar atelectasis also present. Underlying emphysematous changes. Atherosclerotic calcification aorta. No pneumothorax. Bones demineralized. BILATERAL glenohumeral degenerative changes. IMPRESSION: Enlargement of cardiac silhouette with pulmonary vascular congestion and question mild pulmonary edema. Persistent LEFT pleural effusion and probable LEFT lower lobe infiltrate. New tiny RIGHT pleural effusion and minimal RIGHT basilar atelectasis. Electronically Signed   By: Ulyses Southward M.D.   On: 08/13/2015 08:41     ASSESSMENT AND PLAN:   * Bilateral pneumonia  On IV ceftriaxone and azithromycin. Check sputum cultures.  * Acute on chronic respiratory Failure  Uses oxygen at night at home.  Continue supplemental oxygen here. Nebulizers when necessary. Wean off oxygen as tolerated.  * End-stage renal disease  on hemodialysis  nephrology consult   * Atrial fibrillation She is on Coumadin heart rate is under control.  * Recent stroke from which she completely recovered, and had left  carotid endarterectomy  Continue her home medications.  * Diabetes Continue basal insulin and keep on insulin sliding scale coverage.  * DVT prophylaxis patient is on Coumadin   All the records are reviewed and case discussed with Care Management/Social Workerr. Management plans discussed with the patient, family and they are in agreement.  CODE STATUS: FULL  DVT Prophylaxis: SCDs  TOTAL TIME TAKING CARE OF THIS PATIENT: 30 minutes.   POSSIBLE D/C IN 1-2 DAYS, DEPENDING ON CLINICAL CONDITION.  Milagros Loll R M.D on 08/14/2015 at 2:55 PM  Between 7am to 6pm - Pager - [phone]  After 6pm go to [email or web address] - password [email or web address]  [facility]  Office  [phone]  CC: Primary care physician; [name], MD  Note: This  dictation was prepared with Dragon dictation along with smaller phrase technology. Any transcriptional errors that result from this process are unintentional.

## 2015-08-14 NOTE — Progress Notes (Signed)
Subjective:  Patient is admitted for management of pneumonia Last Friday, she saw Dr Gavin Potters and tested for Flu Patient looks 100% better Breathing much better.  2 L taken off of dialysis Sitting up in a chair  Objective:  Vital signs in last 24 hours:  Temp:  [98.1 F (36.7 C)-99.1 F (37.3 C)] 99.1 F (37.3 C) (03/14 2051) Pulse Rate:  [71-113] 113 (03/15 0549) Resp:  [18-42] 18 (03/15 0549) BP: (130-165)/(60-112) 144/79 mmHg (03/15 0549) SpO2:  [95 %-100 %] 98 % (03/15 1515) FiO2 (%):  [28 %] 28 % (03/15 1515) Weight:  [73.4 kg (161 lb 13.1 oz)-76.1 kg (167 lb 12.3 oz)] 73.4 kg (161 lb 13.1 oz) (03/14 2015)  Weight change:  Filed Weights   08/13/15 0811 08/13/15 1730 08/13/15 2015  Weight: 75.751 kg (167 lb) 76.1 kg (167 lb 12.3 oz) 73.4 kg (161 lb 13.1 oz)    Intake/Output:    Intake/Output Summary (Last 24 hours) at 08/14/15 1541 Last data filed at 08/14/15 0600  Gross per 24 hour  Intake     40 ml  Output   2000 ml  Net  -1960 ml     Physical Exam: General: NAD  HEENT anicteric  Neck supple  Pulm/lungs Much improved, decreased breath sounds at bases  CVS/Heart Irregular, no rub  Abdomen:  Soft, NT  Extremities: + edema  Neurologic: Alert, oriented  Skin: No acute rashes  Access: AVF       Basic Metabolic Panel:   Recent Labs Lab 08/13/15 0919 08/14/15 0414  NA 135 136  K 3.2* 2.8*  CL 97* 97*  CO2 20* 26  GLUCOSE 60* 110*  BUN 70* 39*  CREATININE 5.47* 3.80*  CALCIUM 8.3* 7.8*     CBC:  Recent Labs Lab 08/13/15 0919 08/14/15 0414  WBC 15.2* 11.0  HGB 11.4* 10.7*  HCT 34.0* 32.4*  MCV 93.5 92.7  PLT 341 320      Microbiology:  Recent Results (from the past 720 hour(s))  Surgical pcr screen     Status: None   Collection Time: 07/19/15 12:16 PM  Result Value Ref Range Status   MRSA, PCR NEGATIVE NEGATIVE Final   Staphylococcus aureus NEGATIVE NEGATIVE Final    Comment:        The Xpert SA Assay (FDA approved for NASAL  specimens in patients over 28 years of age), is one component of a comprehensive surveillance program.  Test performance has been validated by [facility] for patients greater than or equal to 83 year old. It is not intended to diagnose infection nor to guide or monitor treatment.   MRSA PCR Screening     Status: None   Collection Time: 07/26/15  5:00 PM  Result Value Ref Range Status   MRSA by PCR NEGATIVE NEGATIVE Final    Comment:        The GeneXpert MRSA Assay (FDA approved for NASAL specimens only), is one component of a comprehensive MRSA colonization surveillance program. It is not intended to diagnose MRSA infection nor to guide or monitor treatment for MRSA infections.   Blood culture (routine x 2)     Status: None (Preliminary result)   Collection Time: 08/13/15  9:48 AM  Result Value Ref Range Status   Specimen Description BLOOD LEFT AC  Final   Special Requests   Final    BOTTLES DRAWN AEROBIC AND ANAEROBIC ANA AER   Culture NO GROWTH < 24 HOURS  Final   Report  Status PENDING  Incomplete  Blood culture (routine x 2)     Status: None (Preliminary result)   Collection Time: 08/13/15  9:48 AM  Result Value Ref Range Status   Specimen Description BLOOD LEFT ARM  Final   Special Requests BOTTLES DRAWN AEROBIC AND ANAEROBIC  Final   Culture NO GROWTH < 24 HOURS  Final   Report Status PENDING  Incomplete    Coagulation Studies:  Recent Labs  08/13/15 0919 08/14/15 0414  LABPROT 28.9* 26.3*  INR 2.78 2.45    Urinalysis: No results for input(s): COLORURINE, LABSPEC, PHURINE, GLUCOSEU, HGBUR, BILIRUBINUR, KETONESUR, PROTEINUR, UROBILINOGEN, NITRITE, LEUKOCYTESUR in the last 72 hours.  Invalid input(s): APPERANCEUR    Imaging: Dg Chest 2 View  08/13/2015  CLINICAL DATA:  Diagnosed with the flu last Friday, dialysis patient, CHF, pleural effusion by prior chest radiograph, increased trouble breathing this morning diabetes mellitus,  hypertension, stroke, GERD EXAM: CHEST  2 VIEW COMPARISON:  01/11/2015 FINDINGS: Enlargement of cardiac silhouette with pulmonary vascular congestion. Perihilar infiltrates likely representing mild pulmonary edema. Persistent LEFT pleural effusion with atelectasis versus consolidation in LEFT lower lobe, favor consolidation. Tiny RIGHT pleural effusion and minimal RIGHT basilar atelectasis also present. Underlying emphysematous changes. Atherosclerotic calcification aorta. No pneumothorax. Bones demineralized. BILATERAL glenohumeral degenerative changes. IMPRESSION: Enlargement of cardiac silhouette with pulmonary vascular congestion and question mild pulmonary edema. Persistent LEFT pleural effusion and probable LEFT lower lobe infiltrate. New tiny RIGHT pleural effusion and minimal RIGHT basilar atelectasis. Electronically Signed   By: Ulyses Southward M.D.   On: 08/13/2015 08:41     Medications:     . acetaminophen  650 mg Oral BID  . aspirin  81 mg Oral Daily  . azithromycin  250 mg Intravenous Q24H  . calcium acetate (Phos Binder)  1,334 mg Oral TID WC  . cefTRIAXone (ROCEPHIN)  IV  1 g Intravenous Q24H  . diltiazem  120 mg Oral Daily  . famotidine  10 mg Oral BID  . furosemide  80 mg Oral BID  . gabapentin  100 mg Oral Once per day on Sun Mon Wed Fri  . gabapentin  300 mg Oral Once per day on Tue Thu Sat  . insulin aspart  0-9 Units Subcutaneous TID WC  . insulin detemir  10 Units Subcutaneous BID  . ipratropium-albuterol  3 mL Nebulization Q4H  . multivitamin  1 tablet Oral Daily  . omega-3 acid ethyl esters  1 g Oral Daily  . PARoxetine  40 mg Oral QHS  . pravastatin  20 mg Oral Daily  . warfarin  1.5 mg Oral Once per day on Sun Tue Wed Thu Fri Sat  . Warfarin - Pharmacist Dosing Inpatient   Does not apply q1800   bisacodyl, diphenhydrAMINE, docusate sodium, senna-docusate, traMADol  Assessment/ Plan:  82 y.o. female with past medical history of atrial fibrillation on Coumadin,  hypertension, gout, insulin-dependent diabetes mellitus type 2, diabetic neuropathy, CVA with right visual field blindness, hyperlipidemia, major depressive disorder, overactive bladder with incontinence, generalized anxiety disorder, congestive heart failure diastolic, parathyroidectomy, and osteoarthritis, s/p left CEA 07/26/15  [facility]TS [employer].   1. ESRD on HD TTHS:  - dialysis tomorrow - Attempt volume removal with IV albumin to decrease pleural effusions  2. Anemia chronic kidney disease. hgb 10.7 - low dose EPO with HD if Hgb < 10.5  3. Secondary hyperparathyroidism.  - Monitor phos - appetite poor  4. Flu/Pneumonia -  As per IM team  5.  Pleural effusions - see above     LOS: 1 Ellijah Leffel 3/15/20173:41 PM

## 2015-08-15 LAB — GLUCOSE, CAPILLARY
GLUCOSE-CAPILLARY: 184 mg/dL — AB (ref 65–99)
Glucose-Capillary: 63 mg/dL — ABNORMAL LOW (ref 65–99)
Glucose-Capillary: 141 mg/dL — ABNORMAL HIGH (ref 65–99)

## 2015-08-15 LAB — BASIC METABOLIC PANEL
Anion gap: 15 (ref 5–15)
BUN: 54 mg/dL — AB (ref 6–20)
CALCIUM: 8.4 mg/dL — AB (ref 8.9–10.3)
CO2: 23 mmol/L (ref 22–32)
CREATININE: 4.86 mg/dL — AB (ref 0.44–1.00)
Chloride: 97 mmol/L — ABNORMAL LOW (ref 101–111)
GFR calc non Af Amer: 8 mL/min — ABNORMAL LOW (ref >60)
GFR, EST AFRICAN AMERICAN: 9 mL/min — AB (ref >60)
Glucose, Bld: 68 mg/dL (ref 65–99)
Potassium: 3.8 mmol/L (ref 3.5–5.1)
SODIUM: 135 mmol/L (ref 135–145)

## 2015-08-15 LAB — PROTIME-INR
INR: 2.13
PROTHROMBIN TIME: 23.7 s — AB (ref 11.4–15.0)

## 2015-08-15 LAB — PHOSPHORUS: PHOSPHORUS: 4.8 mg/dL — AB (ref 2.5–4.6)

## 2015-08-15 LAB — HEPATITIS B SURFACE ANTIGEN: Hepatitis B Surface Ag: NEGATIVE

## 2015-08-15 LAB — MAGNESIUM: Magnesium: 2 mg/dL (ref 1.7–2.4)

## 2015-08-15 LAB — HEPATITIS B SURFACE ANTIBODY, QUANTITATIVE: HEPATITIS B-POST: 4.7 m[IU]/mL — AB

## 2015-08-15 MED ORDER — DILTIAZEM HCL 25 MG/5ML IV SOLN
10.0000 mg | Freq: Once | INTRAVENOUS | Status: AC
  Administered 2015-08-15: 10 mg via INTRAVENOUS
  Filled 2015-08-15: qty 5

## 2015-08-15 MED ORDER — DILTIAZEM HCL ER COATED BEADS 240 MG PO CP24
240.0000 mg | ORAL_CAPSULE | Freq: Every day | ORAL | Status: DC
  Administered 2015-08-15 – 2015-08-16 (×2): 240 mg via ORAL
  Filled 2015-08-15 (×2): qty 1

## 2015-08-15 MED ORDER — METOPROLOL TARTRATE 1 MG/ML IV SOLN
10.0000 mg | INTRAVENOUS | Status: DC | PRN
  Administered 2015-08-15: 10 mg via INTRAVENOUS
  Filled 2015-08-15: qty 10

## 2015-08-15 MED ORDER — IPRATROPIUM-ALBUTEROL 0.5-2.5 (3) MG/3ML IN SOLN
3.0000 mL | Freq: Four times a day (QID) | RESPIRATORY_TRACT | Status: DC
  Administered 2015-08-15 – 2015-08-16 (×3): 3 mL via RESPIRATORY_TRACT
  Filled 2015-08-15 (×3): qty 3

## 2015-08-15 MED ORDER — INSULIN DETEMIR 100 UNIT/ML ~~LOC~~ SOLN
6.0000 [IU] | Freq: Two times a day (BID) | SUBCUTANEOUS | Status: DC
  Administered 2015-08-16: 6 [IU] via SUBCUTANEOUS
  Filled 2015-08-15 (×3): qty 0.06

## 2015-08-15 MED ORDER — LEVOFLOXACIN 500 MG PO TABS
500.0000 mg | ORAL_TABLET | ORAL | Status: DC
  Administered 2015-08-15: 500 mg via ORAL
  Filled 2015-08-15: qty 1

## 2015-08-15 MED ORDER — LIDOCAINE-PRILOCAINE 2.5-2.5 % EX CREA
TOPICAL_CREAM | Freq: Once | CUTANEOUS | Status: AC
  Administered 2015-08-15: 15:00:00 via TOPICAL
  Filled 2015-08-15: qty 5

## 2015-08-15 NOTE — Progress Notes (Signed)
Pt HR elevated, remaining in 120's.Afibb,  Primary RN spoke to Dr. Sheryle Hailiamond. Orders received to Give AM dose early of Diltiazem 120 mg PO. Primary RN to continue to monitor.

## 2015-08-15 NOTE — Progress Notes (Signed)
Pre-hd tx 

## 2015-08-15 NOTE — Progress Notes (Signed)
Inpatient Diabetes Program Recommendations  AACE/ADA: New Consensus Statement on Inpatient Glycemic Control (2015)  Target Ranges:  Prepandial:   less than 140 mg/dL      Peak postprandial:   less than 180 mg/dL (1-2 hours)      Critically ill patients:  140 - 180 mg/dL   Review of Glycemic Control  Results for [name], [name] (MRN [id number]) as of 08/15/2015 08:17  Ref. Range 08/14/2015 07:32 08/14/2015 11:33 08/14/2015 16:41 08/14/2015 21:55 08/15/2015 07:23  Glucose-Capillary Latest Ref Range: 65-99 mg/dL 409101 (H) 811380 (H) 914131 (H) 107 (H) 63 (L)    Diabetes history: Type 2 Outpatient Diabetes medications: Levemir 11 units bid Current orders for Inpatient glycemic control: Lantus 10 units bid, Novolog 0-9 units tid  Inpatient Diabetes Program Recommendations:  Very low blood sugars during admission. Please consider decreasing Lantus to 5 units bid.     Susette RacerJulie Kyleah Pensabene, RN, BA, MHA, CDE Diabetes Coordinator Inpatient Diabetes Program  [phone] (Team Pager) [phone] [facility] Office) 08/15/2015 8:24 AM

## 2015-08-15 NOTE — Progress Notes (Signed)
Post hd tx 

## 2015-08-15 NOTE — Progress Notes (Signed)
[facility] [place] [facility]   PATIENT NAME: [name]    MR#:  [id number]  DATE OF BIRTH:  1934/02/16  SUBJECTIVE:  CHIEF COMPLAINT:   Chief Complaint  Patient presents with  . Shortness of Breath  . Influenza   Shortness of breath and cough is still present. Had A. fib with RVR overnight. Confusion overnight.  Daughter at bedside today.  REVIEW OF SYSTEMS:    Review of Systems  Constitutional: Positive for chills and malaise/fatigue. Negative for fever.  HENT: Negative for sore throat.   Eyes: Negative for blurred vision, double vision and pain.  Respiratory: Positive for cough and shortness of breath. Negative for hemoptysis and wheezing.   Cardiovascular: Negative for chest pain, palpitations, orthopnea and leg swelling.  Gastrointestinal: Negative for heartburn, nausea, vomiting, abdominal pain, diarrhea and constipation.  Genitourinary: Negative for dysuria and hematuria.  Musculoskeletal: Positive for myalgias. Negative for back pain and joint pain.  Skin: Negative for rash.  Neurological: Positive for weakness. Negative for sensory change, speech change, focal weakness and headaches.  Endo/Heme/Allergies: Does not bruise/bleed easily.  Psychiatric/Behavioral: Negative for depression. The patient is not nervous/anxious.     DRUG ALLERGIES:   Allergies  Allergen Reactions  . Codeine Other (See Comments)    Reaction:  Confusion/Hallucinations   . Contrast Media [Iodinated Diagnostic Agents] Other (See Comments)    "skin peel"  . Oxycodone Other (See Comments)    Reaction:  Confusion/Hallucinations   . Quinine Derivatives Rash    VITALS:  Blood pressure 108/50, pulse 75, temperature 97.6 F (36.4 C), temperature source Oral, resp. rate 16, height 5\' 5"  (1.651 m), weight 73.4 kg (161 lb 13.1 oz), SpO2 97 %.  PHYSICAL EXAMINATION:   Physical Exam  GENERAL:  82 y.o.-year-old patient lying in the bed with no acute distress.   EYES: Pupils equal, round, reactive to light and accommodation. No scleral icterus. Extraocular muscles intact.  HEENT: Head atraumatic, normocephalic. Oropharynx and nasopharynx clear.  NECK:  Supple, no jugular venous distention. No thyroid enlargement, no tenderness.  LUNGS: Normal work of breathing with bilateral rhonchi. No use of accessory muscles  CARDIOVASCULAR: S1, S2 irregular. Tachycardia. ABDOMEN: Soft, nontender, nondistended. Bowel sounds present. No organomegaly or mass.  EXTREMITIES: No cyanosis, clubbing or edema b/l.    NEUROLOGIC: Cranial nerves II through XII are intact. No focal Motor or sensory deficits b/l.   PSYCHIATRIC: The patient is alert and oriented x 3.  SKIN: No obvious rash, lesion, or ulcer.   LABORATORY PANEL:   CBC  Recent Labs Lab 08/14/15 0414  WBC 11.0  HGB 10.7*  HCT 32.4*  PLT 320   ------------------------------------------------------------------------------------------------------------------ Chemistries   Recent Labs Lab 08/13/15 0919  08/15/15 0503  NA 135  < > 135  K 3.2*  < > 3.8  CL 97*  < > 97*  CO2 20*  < > 23  GLUCOSE 60*  < > 68  BUN 70*  < > 54*  CREATININE 5.47*  < > 4.86*  CALCIUM 8.3*  < > 8.4*  MG  --   --  2.0  AST 22  --   --   ALT 8*  --   --   ALKPHOS 88  --   --   BILITOT 1.6*  --   --   < > = values in this interval not displayed. ------------------------------------------------------------------------------------------------------------------  Cardiac Enzymes  Recent Labs Lab 08/13/15 1835  TROPONINI 0.07*   ------------------------------------------------------------------------------------------------------------------  RADIOLOGY:  No results found.   ASSESSMENT AND PLAN:   * Bilateral pneumonia  On IV ceftriaxone and azithromycin.  sputum cultures pending. Change IV antibiotics to oral Levaquin.  * Acute on chronic diastolic CHF. Likely decompensated due to pneumonia. Improving  with dialysis.  * Acute on chronic respiratory Failure  Uses oxygen at night at home.  Continue supplemental oxygen here. Nebulizers when necessary. Wean off oxygen as tolerated.  * End-stage renal disease  on hemodialysis  nephrology consulted Patient is to get dialysis again today.  * Atrial fibrillation She is on Coumadin heart rate is under control.  * Recent stroke from which she completely recovered, and had left  carotid endarterectomy  Continue her home medications.  * Diabetes Continue basal insulin and keep on insulin sliding scale coverage.  * DVT prophylaxis patient is on Coumadin   All the records are reviewed and case discussed with Care Management/Social Workerr. Management plans discussed with the patient, family and they are in agreement.  CODE STATUS: FULL  DVT Prophylaxis: SCDs  TOTAL TIME TAKING CARE OF THIS PATIENT: 30 minutes.   POSSIBLE D/C IN 1-2 DAYS, DEPENDING ON CLINICAL CONDITION.  Milagros Loll R M.D on 08/15/2015 at 2:13 PM  Between 7am to 6pm - Pager - [phone]  After 6pm go to [email or web address] - password [email or web address]  [facility]  Office  [phone]  CC: Primary care physician; [name], MD  Note: This dictation was prepared with Dragon dictation along with smaller phrase technology. Any transcriptional errors that result from this process are unintentional.

## 2015-08-15 NOTE — Progress Notes (Signed)
Pt HR remaining elevated. 120's. Prime Dr paged. Primary nurse spoke to Dr. Sheryle Hailiamond. MD to place orders. Primary nurse to continue to monitor pt.

## 2015-08-15 NOTE — Progress Notes (Signed)
ANTICOAGULATION CONSULT NOTE - Follow up Consult  Pharmacy Consult for Warfarin  Indication: atrial fibrillation  Allergies  Allergen Reactions  . Codeine Other (See Comments)    Reaction:  Confusion/Hallucinations   . Contrast Media [Iodinated Diagnostic Agents] Other (See Comments)    "skin peel"  . Oxycodone Other (See Comments)    Reaction:  Confusion/Hallucinations   . Quinine Derivatives Rash    Patient Measurements: Height:  (165.1 cm) Weight: 161 lb 13.1 oz (73.4 kg) IBW/kg (Calculated) : 57   Vital Signs: Temp: 98.8 F (37.1 C) (03/16 0433) Temp Source: Oral (03/16 0409) BP: 111/62 mmHg (03/16 0616) Pulse Rate: 98 (03/16 0433)  Labs:  Recent Labs  08/13/15 0919 08/13/15 1333 08/13/15 1607 08/13/15 1835 08/14/15 0414 08/15/15 0503  HGB 11.4*  --   --   --  10.7*  --   HCT 34.0*  --   --   --  32.4*  --   PLT 341  --   --   --  320  --   LABPROT 28.9*  --   --   --  26.3* 23.7*  INR 2.78  --   --   --  2.45 2.13  CREATININE 5.47*  --   --   --  3.80* 4.86*  TROPONINI 0.07* 0.06* 0.07* 0.07*  --   --     Estimated Creatinine Clearance: 9.4 mL/min (by C-G formula based on Cr of 4.86).   Medical History: Past Medical History  Diagnosis Date  . Chronic kidney disease   . Dysrhythmia   . Diabetes mellitus without complication (HCC)   . Hypertension   . Stroke (HCC)   . Arthritis     gout  . Dialysis patient (HCC)   . Pleural effusion   . Pulmonary hypertension (HCC)   . Renal insufficiency   . CHF (congestive heart failure) (HCC)   . Peripheral vascular disease (HCC)   . Shortness of breath dyspnea     with exertion  . GERD (gastroesophageal reflux disease)   . Anemia   . Restless leg syndrome   . Atrial fibrillation (HCC)     Medications:  Scheduled:  . acetaminophen  650 mg Oral BID  . aspirin  81 mg Oral Daily  . azithromycin  250 mg Intravenous Q24H  . calcium acetate (Phos Binder)  1,334 mg Oral TID WC  . cefTRIAXone  (ROCEPHIN)  IV  1 g Intravenous Q24H  . diltiazem  240 mg Oral Daily  . famotidine  10 mg Oral BID  . furosemide  80 mg Oral BID  . gabapentin  100 mg Oral Once per day on Sun Mon Wed Fri  . gabapentin  300 mg Oral Once per day on Tue Thu Sat  . insulin aspart  0-9 Units Subcutaneous TID WC  . insulin detemir  10 Units Subcutaneous BID  . ipratropium-albuterol  3 mL Nebulization Q4H  . multivitamin  1 tablet Oral Daily  . omega-3 acid ethyl esters  1 g Oral Daily  . PARoxetine  40 mg Oral QHS  . pravastatin  20 mg Oral Daily  . warfarin  1.5 mg Oral Once per day on Sun Tue Wed Thu Fri Sat  . Warfarin - Pharmacist Dosing Inpatient   Does not apply q1800    Assessment: Pharmacy to manage patientWarfarin for Chronic AFib.  PTA dose:  Warfarin 1.5 mg daily Except Monday.  3/14  INR:  2.78    ?Warfarin not charted as  being given 3/15  INR:  2.45     Warfarin 1.5 mg 3/16  INR:  2.13  Goal of Therapy:  INR 2-3 Monitor platelets by anticoagulation protocol: Yes   Plan:  INR therapeutic. Dose on 3/14, not charted on MAR as given and hence dip in INR. Will continue current home dose of Warfarin 1.5 mg daily Except Mondays and monitor INR. Patient on Azithromycin/Ceftriaxone.  Lacie Landry A 08/15/2015,8:27 AM

## 2015-08-15 NOTE — Progress Notes (Signed)
Hemodialysis start 

## 2015-08-15 NOTE — Progress Notes (Signed)
Contacted Diabetes Education Coordinator Raynelle Fanning(Julie) for support in managing pt's insulin d/t sporadic blood glucose levels.  Raynelle FanningJulie states she will create a suggestion/reference note for the MD.   Dr. Elpidio AnisSudini notified.  Will continue to monitor and implement changes per MD orders.

## 2015-08-15 NOTE — Progress Notes (Signed)
Subjective:  Patient is admitted for management of pneumonia Last Friday, she saw Dr Gavin Potters and tested for Flu Patient looks 100% better Breathing much better After  2 L taken off of dialysis Sitting up in a chair today. Reports that her congestion is starting to come back  Objective:  Vital signs in last 24 hours:  Temp:  [97.8 F (36.6 C)-98.8 F (37.1 C)] 98.8 F (37.1 C) (03/16 0433) Pulse Rate:  [54-114] 98 (03/16 0433) Resp:  [18-20] 20 (03/16 0433) BP: (106-146)/(48-87) 111/62 mmHg (03/16 0616) SpO2:  [94 %-100 %] 94 % (03/16 0724) FiO2 (%):  [28 %] 28 % (03/15 1515)  Weight change:  Filed Weights   08/13/15 0811 08/13/15 1730 08/13/15 2015  Weight: 75.751 kg (167 lb) 76.1 kg (167 lb 12.3 oz) 73.4 kg (161 lb 13.1 oz)    Intake/Output:    Intake/Output Summary (Last 24 hours) at 08/15/15 1155 Last data filed at 08/15/15 0610  Gross per 24 hour  Intake    295 ml  Output    200 ml  Net     95 ml     Physical Exam: General: NAD  HEENT anicteric  Neck supple  Pulm/lungs  diffuse basilar crackles, , scattered rhonchi   CVS/Heart Irregular, no rub  Abdomen:  Soft, NT  Extremities: + edema  Neurologic: Alert, oriented  Skin: No acute rashes  Access: AVF       Basic Metabolic Panel:   Recent Labs Lab 08/13/15 0919 08/14/15 0414 08/15/15 0503  NA 135 136 135  K 3.2* 2.8* 3.8  CL 97* 97* 97*  CO2 20* 26 23  GLUCOSE 60* 110* 68  BUN 70* 39* 54*  CREATININE 5.47* 3.80* 4.86*  CALCIUM 8.3* 7.8* 8.4*  MG  --   --  2.0     CBC:  Recent Labs Lab 08/13/15 0919 08/14/15 0414  WBC 15.2* 11.0  HGB 11.4* 10.7*  HCT 34.0* 32.4*  MCV 93.5 92.7  PLT 341 320      Microbiology:  Recent Results (from the past 720 hour(s))  Surgical pcr screen     Status: None   Collection Time: 07/19/15 12:16 PM  Result Value Ref Range Status   MRSA, PCR NEGATIVE NEGATIVE Final   Staphylococcus aureus NEGATIVE NEGATIVE Final    Comment:        The Xpert SA  Assay (FDA approved for NASAL specimens in patients over 63 years of age), is one component of a comprehensive surveillance program.  Test performance has been validated by [facility] for patients greater than or equal to 43 year old. It is not intended to diagnose infection nor to guide or monitor treatment.   MRSA PCR Screening     Status: None   Collection Time: 07/26/15  5:00 PM  Result Value Ref Range Status   MRSA by PCR NEGATIVE NEGATIVE Final    Comment:        The GeneXpert MRSA Assay (FDA approved for NASAL specimens only), is one component of a comprehensive MRSA colonization surveillance program. It is not intended to diagnose MRSA infection nor to guide or monitor treatment for MRSA infections.   Blood culture (routine x 2)     Status: None (Preliminary result)   Collection Time: 08/13/15  9:48 AM  Result Value Ref Range Status   Specimen Description BLOOD LEFT AC  Final   Special Requests   Final    BOTTLES DRAWN AEROBIC AND ANAEROBIC ANA AER  Culture NO GROWTH 2 DAYS  Final   Report Status PENDING  Incomplete  Blood culture (routine x 2)     Status: None (Preliminary result)   Collection Time: 08/13/15  9:48 AM  Result Value Ref Range Status   Specimen Description BLOOD LEFT ARM  Final   Special Requests BOTTLES DRAWN AEROBIC AND ANAEROBIC 3ML  Final   Culture NO GROWTH 2 DAYS  Final   Report Status PENDING  Incomplete    Coagulation Studies:  Recent Labs  08/13/15 0919 08/14/15 0414 08/15/15 0503  LABPROT 28.9* 26.3* 23.7*  INR 2.78 2.45 2.13    Urinalysis: No results for input(s): COLORURINE, LABSPEC, PHURINE, GLUCOSEU, HGBUR, BILIRUBINUR, KETONESUR, PROTEINUR, UROBILINOGEN, NITRITE, LEUKOCYTESUR in the last 72 hours.  Invalid input(s): APPERANCEUR    Imaging: No results found.   Medications:     . acetaminophen  650 mg Oral BID  . aspirin  81 mg Oral Daily  . azithromycin  250 mg Intravenous Q24H  . calcium acetate  (Phos Binder)  1,334 mg Oral TID WC  . cefTRIAXone (ROCEPHIN)  IV  1 g Intravenous Q24H  . diltiazem  240 mg Oral Daily  . famotidine  10 mg Oral BID  . furosemide  80 mg Oral BID  . gabapentin  100 mg Oral Once per day on Sun Mon Wed Fri  . gabapentin  300 mg Oral Once per day on Tue Thu Sat  . insulin aspart  0-9 Units Subcutaneous TID WC  . insulin detemir  6 Units Subcutaneous BID  . ipratropium-albuterol  3 mL Nebulization Q4H  . lidocaine-prilocaine   Topical Once  . multivitamin  1 tablet Oral Daily  . omega-3 acid ethyl esters  1 g Oral Daily  . PARoxetine  40 mg Oral QHS  . pravastatin  20 mg Oral Daily  . warfarin  1.5 mg Oral Once per day on Sun Tue Wed Thu Fri Sat  . Warfarin - Pharmacist Dosing Inpatient   Does not apply q1800   bisacodyl, diphenhydrAMINE, docusate sodium, metoprolol, senna-docusate, traMADol  Assessment/ Plan:  82 y.o. female with past medical history of atrial fibrillation on Coumadin, hypertension, gout, insulin-dependent diabetes mellitus type 2, diabetic neuropathy, CVA with right visual field blindness, hyperlipidemia, major depressive disorder, overactive bladder with incontinence, generalized anxiety disorder, congestive heart failure diastolic, parathyroidectomy, and osteoarthritis, s/p left CEA 07/26/15  [facility]   1. ESRD on HD TTHS:  - dialysis Today - Attempt volume removal with IV albumin to decrease pleural effusions  2. Anemia chronic kidney disease. hgb 10.7 - low dose EPO with HD if Hgb < 10.5  3. Secondary hyperparathyroidism.  - Monitor phos - appetite poor  4. Flu/Pneumonia -  As per IM team  5.  Pleural effusions - see above     LOS: 2 Regenia Erck 3/16/201711:55 AM

## 2015-08-15 NOTE — Progress Notes (Signed)
Telemetry clerk notified primary RN of elevated HR 130's Afibb. Prime Dr. Luberta RobertsonPaged. Primary RN spoke to Dr. Sheryle Hailiamond. MD to place orders. Primary nurse to continue to monitor.

## 2015-08-15 NOTE — Progress Notes (Signed)
Hemodialysis completed. 

## 2015-08-16 LAB — GLUCOSE, CAPILLARY
GLUCOSE-CAPILLARY: 144 mg/dL — AB (ref 65–99)
Glucose-Capillary: 143 mg/dL — ABNORMAL HIGH (ref 65–99)
Glucose-Capillary: 158 mg/dL — ABNORMAL HIGH (ref 65–99)

## 2015-08-16 LAB — PROTIME-INR
INR: 2.32
PROTHROMBIN TIME: 25.2 s — AB (ref 11.4–15.0)

## 2015-08-16 MED ORDER — WARFARIN SODIUM 1 MG PO TABS
1.5000 mg | ORAL_TABLET | Freq: Once | ORAL | Status: AC
  Administered 2015-08-16: 1.5 mg via ORAL
  Filled 2015-08-16: qty 2

## 2015-08-16 MED ORDER — LEVOFLOXACIN 500 MG PO TABS: 500.0000 mg | ORAL_TABLET | ORAL | Status: DC

## 2015-08-16 NOTE — Care Management (Signed)
Late entry   Patient admitted with bilateral PNA.  Patient lives at home with her daughter.  Patient obtains her medications from [facility] and [facility] on [address]. Patient has a WC, power WC, scooter, shower chair, BSC, elevated toilet seat, and nocturnal O2 through [facility].  Patient did not qualify this admission for continuous O2.  Patient is Open with [facility] for RN, PT, and OT.  Patient states that they have had [facility] in the past and would like to switch back to them.  Notified Abby with [facility] and services were discontinued.  Home health orders were place and [facility] [place] [facility]  Daughter to transport at time of discharge RNSM signing off

## 2015-08-16 NOTE — Progress Notes (Signed)
ANTICOAGULATION CONSULT NOTE - Follow up Consult  Pharmacy Consult for Warfarin  Indication: atrial fibrillation  Allergies  Allergen Reactions  . Codeine Other (See Comments)    Reaction:  Confusion/Hallucinations   . Contrast Media [Iodinated Diagnostic Agents] Other (See Comments)    "skin peel"  . Oxycodone Other (See Comments)    Reaction:  Confusion/Hallucinations   . Quinine Derivatives Rash    Patient Measurements: Height: 5\' 5"  (165.1 cm) Weight: 162 lb 0.6 oz (73.5 kg) IBW/kg (Calculated) : 57   Vital Signs: Temp: 98.1 F (36.7 C) (03/17 0436) Temp Source: Oral (03/17 0436) BP: 118/68 mmHg (03/17 0436) Pulse Rate: 93 (03/17 0436)  Labs:  Recent Labs  08/13/15 0919 08/13/15 1333 08/13/15 1607 08/13/15 1835 08/14/15 0414 08/15/15 0503 08/16/15 0344  HGB 11.4*  --   --   --  10.7*  --   --   HCT 34.0*  --   --   --  32.4*  --   --   PLT 341  --   --   --  320  --   --   LABPROT 28.9*  --   --   --  26.3* 23.7* 25.2*  INR 2.78  --   --   --  2.45 2.13 2.32  CREATININE 5.47*  --   --   --  3.80* 4.86*  --   TROPONINI 0.07* 0.06* 0.07* 0.07*  --   --   --     Estimated Creatinine Clearance: 9.4 mL/min (by C-G formula based on Cr of 4.86).   Medical History: Past Medical History  Diagnosis Date  . Chronic kidney disease   . Dysrhythmia   . Diabetes mellitus without complication (HCC)   . Hypertension   . Stroke (HCC)   . Arthritis     gout  . Dialysis patient (HCC)   . Pleural effusion   . Pulmonary hypertension (HCC)   . Renal insufficiency   . CHF (congestive heart failure) (HCC)   . Peripheral vascular disease (HCC)   . Shortness of breath dyspnea     with exertion  . GERD (gastroesophageal reflux disease)   . Anemia   . Restless leg syndrome   . Atrial fibrillation (HCC)     Medications:  Scheduled:  . acetaminophen  650 mg Oral BID  . aspirin  81 mg Oral Daily  . calcium acetate (Phos Binder)  1,334 mg Oral TID WC  . diltiazem   240 mg Oral Daily  . famotidine  10 mg Oral BID  . furosemide  80 mg Oral BID  . gabapentin  100 mg Oral Once per day on Sun Mon Wed Fri  . gabapentin  300 mg Oral Once per day on Tue Thu Sat  . insulin aspart  0-9 Units Subcutaneous TID WC  . insulin detemir  6 Units Subcutaneous BID  . ipratropium-albuterol  3 mL Nebulization Q6H WA  . levofloxacin  500 mg Oral Q48H  . multivitamin  1 tablet Oral Daily  . omega-3 acid ethyl esters  1 g Oral Daily  . PARoxetine  40 mg Oral QHS  . pravastatin  20 mg Oral Daily  . warfarin  1.5 mg Oral Once per day on Sun Tue Wed Thu Fri Sat  . Warfarin - Pharmacist Dosing Inpatient   Does not apply q1800    Assessment: Pharmacy to manage patientWarfarin for Chronic AFib.  PTA dose:  Warfarin 1.5 mg daily Except Monday.  3/14  INR:  2.78    Warfarin not charted as being given 3/15  INR:  2.45     Warfarin 1.5 mg 3/16  INR:  2.13     Warfarin not charted as being given again 3/17  INR:  2.32  Goal of Therapy:  INR 2-3 Monitor platelets by anticoagulation protocol: Yes   Plan:  INR therapeutic. Dose on 3/16, not charted on MAR as given. Spoke with RN- patient went to Dialysis and dose charted as held but then not given when pt returned to floor. Will order Warfarin 1.5 mg dose this am 3/17. Will continue current home dose of Warfarin 1.5 mg daily Except Mondays and monitor INR. Patient on Levaquin  [name] 08/16/2015,8:47 AM

## 2015-08-16 NOTE — Discharge Instructions (Signed)
°  DIET:  Diabetic diet and Renal diet  DISCHARGE CONDITION:  Stable  ACTIVITY:  Activity as tolerated  OXYGEN:  Home Oxygen: Yes.     Oxygen Delivery: 2 liters/min via Patient connected to nasal cannula oxygen at night  DISCHARGE LOCATION:  home   If you experience worsening of your admission symptoms, develop shortness of breath, life threatening emergency, suicidal or homicidal thoughts you must seek medical attention immediately by calling 911 or calling your MD immediately  if symptoms less severe.  You Must read complete instructions/literature along with all the possible adverse reactions/side effects for all the Medicines you take and that have been prescribed to you. Take any new Medicines after you have completely understood and accpet all the possible adverse reactions/side effects.   Please note  You were cared for by a hospitalist during your hospital stay. If you have any questions about your discharge medications or the care you received while you were in the hospital after you are discharged, you can call the unit and asked to speak with the hospitalist on call if the hospitalist that took care of you is not available. Once you are discharged, your primary care physician will handle any further medical issues. Please note that NO REFILLS for any discharge medications will be authorized once you are discharged, as it is imperative that you return to your primary care physician (or establish a relationship with a primary care physician if you do not have one) for your aftercare needs so that they can reassess your need for medications and monitor your lab values.  Continue dialysis as per prior schedule

## 2015-08-16 NOTE — Care Management Important Message (Signed)
Important Message  Patient Details  Name: [name] MRN: [id number] Date of Birth: 1934-02-28   Medicare Important Message Given:  Yes    Chapman FitchBOWEN, Tida Saner T, RN 08/16/2015, 10:48 AM

## 2015-08-16 NOTE — Progress Notes (Signed)
Patient Alert with confusion. Oriented to person and place, disoriented to time and situation. Daughter came in to sit with patient until she was resting quietly. Patient tolerates meds well. Daughter informed Clinical research associatewriter that right visual field cut resulted in CVA (early 2000), the left visual field is cut as well (stroke 2mos ago). The right visual is worse, than left. Patient was cooperative, but restless and did not try to get up OOB. Staff will continue to monitor and meet needs

## 2015-08-16 NOTE — Progress Notes (Signed)
Pt d/c to home today. IV removed intact.  Rx's given to pt w/all questions and concerns addressed.  D/C paperwork reviewed and education provided with all questions and concerns addressed.  Pt daughter at bedside for home transport.  Volunteer services contact for transportation from room to exit.    

## 2015-08-18 LAB — CULTURE, BLOOD (ROUTINE X 2)
Culture: NO GROWTH
Culture: NO GROWTH

## 2015-08-19 NOTE — Discharge Summary (Signed)
[facility] [place] at [facility]   PATIENT NAME: [name]    MR#:  [id number]  DATE OF BIRTH:  06-Dec-1933  DATE OF ADMISSION:  08/13/2015 ADMITTING PHYSICIAN: Altamese Dilling, MD  DATE OF DISCHARGE: 08/16/2015  1:08 PM  PRIMARY CARE PHYSICIAN: [name], MD   ADMISSION DIAGNOSIS:  Influenza A [J10.1] Community acquired pneumonia [J18.9]  DISCHARGE DIAGNOSIS:  Principal Problem:   Pneumonia   SECONDARY DIAGNOSIS:   Past Medical History  Diagnosis Date  . Chronic kidney disease   . Dysrhythmia   . Diabetes mellitus without complication (HCC)   . Hypertension   . Stroke (HCC)   . Arthritis     gout  . Dialysis patient (HCC)   . Pleural effusion   . Pulmonary hypertension (HCC)   . Renal insufficiency   . CHF (congestive heart failure) (HCC)   . Peripheral vascular disease (HCC)   . Shortness of breath dyspnea     with exertion  . GERD (gastroesophageal reflux disease)   . Anemia   . Restless leg syndrome   . Atrial fibrillation (HCC)      ADMITTING HISTORY  HISTORY OF PRESENT ILLNESS: [name] is a 82 y.o. female with a known history of chronic kidney disease on hemodialysis, diabetes, hypertension, stroke 2 months ago and was found to have significant blockage and left internal carotid artery so had endarterectomy done on 25th of February, pulmonary hypertension, congestive heart failure, peripheral vascular disease, atrial fibrillation on Coumadin- started feeling some cough and generalized weakness for almost a week, concerned with this she went to her primary care doctor last week and he checked for influenza which was positive for influenza a strain, also did a chest x-ray which did not show any pneumonia, so he suggested just to take a lot of liquids and take some rest. She continued to feel worse and started to get short of breath, and has to use oxygen continuously instead of just using at night at her baseline,  so daughter brought her to emergency room today. In ER she is noted to be hypoxic, elevated white cell count, and bilateral lower lobe pneumonia. So given his admission to hospitalist team.  HOSPITAL COURSE:   * Bilateral pneumonia  On IV ceftriaxone and azithromycin During hospital stay sputum cultures Negative Changed IV antibiotics to oral Levaquin. Continue oral Levaquin for 5 more days after discharge  * Acute on chronic diastolic CHF. Likely decompensated due to pneumonia. Resolved with dialysis.  * Acute on chronic respiratory Failure  Uses oxygen at night at home.  Continued supplemental oxygen here. Nebulizers when necessary.  On day of discharge patient's saturations are 93% on room air.  * End-stage renal disease on hemodialysis  nephrology consulted Patient received dialysis on day of discharge. Continue outpatient schedule as before.  * Atrial fibrillation Patient did have one episode of rapid ventricular rate for which she needed IV Cardizem during hospital stay. This was due to her respiratory distress from pneumonia. Heartrate improved and oral Cardizem from home is being continued. Continue Coumadin. INR 2.4.  * Recent stroke from which she completely recovered, and had left carotid endarterectomy  Continue her home medications.  * Diabetes Continue basal insulin . No dose change at discharge  * DVT prophylaxis patient is on Coumadin  * Dementia with inpatient delirium Patient has had similar problems during prior stays as per the daughter and is much improved.  Patient has returned to baseline and will be  discharged back home.  CONSULTS OBTAINED:  Treatment Team:  Mosetta PigeonHarmeet Singh, MD  DRUG ALLERGIES:   Allergies  Allergen Reactions  . Codeine Other (See Comments)    Reaction:  Confusion/Hallucinations   . Contrast Media [Iodinated Diagnostic Agents] Other (See Comments)    "skin peel"  . Oxycodone Other (See Comments)    Reaction:   Confusion/Hallucinations   . Quinine Derivatives Rash    DISCHARGE MEDICATIONS:   Discharge Medication List as of 08/16/2015 12:31 PM    START taking these medications   Details  levofloxacin (LEVAQUIN) 500 MG tablet Take 1 tablet (500 mg total) by mouth every other day., Starting 08/16/2015, Until Discontinued, Print      CONTINUE these medications which have NOT CHANGED   Details  albuterol (PROVENTIL HFA;VENTOLIN HFA) 108 (90 Base) MCG/ACT inhaler Inhale 1-2 puffs into the lungs every 6 (six) hours as needed for wheezing or shortness of breath., Until Discontinued, Historical Med    aspirin 81 MG chewable tablet Chew 1 tablet (81 mg total) by mouth daily., Starting 06/24/2015, Until Discontinued, Normal    Biotin 1 MG CAPS Take 1 mg by mouth daily. , Until Discontinued, Historical Med    bisacodyl (DULCOLAX) 5 MG EC tablet Take 10 mg by mouth daily as needed for moderate constipation. , Until Discontinued, Historical Med    calcium acetate, Phos Binder, (PHOSLYRA) 667 MG/5ML SOLN Take 1,334 mg by mouth 3 (three) times daily with meals., Until Discontinued, Historical Med    diltiazem (TIAZAC) 120 MG 24 hr capsule Take 120 mg by mouth daily., Until Discontinued, Historical Med    diphenhydrAMINE (BENADRYL) 25 mg capsule Take 1 capsule (25 mg total) by mouth every 6 (six) hours as needed (rash, itching)., Starting 06/24/2015, Until Discontinued, Normal    docusate sodium (COLACE) 100 MG capsule Take 200 mg by mouth 2 (two) times daily as needed for mild constipation., Until Discontinued, Historical Med    folic acid-vitamin b complex-vitamin c-selenium-zinc (DIALYVITE) 3 MG TABS tablet Take 1 tablet by mouth daily., Until Discontinued, Historical Med    furosemide (LASIX) 80 MG tablet Take 80 mg by mouth 2 (two) times daily., Until Discontinued, Historical Med    !! gabapentin (NEURONTIN) 100 MG capsule Take 100 mg by mouth at bedtime. Pt takes on Sunday, Monday, Wednesday, and  Friday., Until Discontinued, Historical Med    !! gabapentin (NEURONTIN) 300 MG capsule Take 300 mg by mouth daily. Pt takes on Tuesday, Thursday, and Saturday after dialysis., Until Discontinued, Historical Med    Insulin Detemir (LEVEMIR) 100 UNIT/ML Pen Inject 11 Units into the skin 2 (two) times daily. 0730, 1730, Until Discontinued, Historical Med    lidocaine-prilocaine (EMLA) cream Apply 1 application topically as needed (prior to accessing port). , Until Discontinued, Historical Med    nystatin (MYCOSTATIN/NYSTOP) 100000 UNIT/GM POWD Apply topically 2 (two) times daily as needed., Until Discontinued, Historical Med    Omega-3 Fatty Acids (FISH OIL CONCENTRATE) 300 MG CAPS Take 1 capsule by mouth daily., Starting 11/04/2010, Until Discontinued, Historical Med    PARoxetine (PAXIL) 40 MG tablet Take 40 mg by mouth at bedtime. , Until Discontinued, Historical Med    pravastatin (PRAVACHOL) 20 MG tablet Take 1 tablet (20 mg total) by mouth daily., Starting 07/27/2015, Until Discontinued, Print    ranitidine (ZANTAC) 150 MG tablet Take 150 mg by mouth 2 (two) times daily. , Until Discontinued, Historical Med    senna-docusate (SENOKOT-S) 8.6-50 MG tablet Take 1 tablet by mouth 2 (two)  times daily as needed. , Until Discontinued, Historical Med    traMADol (ULTRAM) 50 MG tablet Take 1 tablet (50 mg total) by mouth every 6 (six) hours as needed for moderate pain., Starting 07/27/2015, Until Discontinued, Print    warfarin (COUMADIN) 3 MG tablet Take 1.5 mg by mouth daily. Everyday except Mondays, Until Discontinued, Historical Med    acetaminophen (TYLENOL) 500 MG tablet Take 1,000 mg by mouth 2 (two) times daily. , Until Discontinued, Historical Med     !! - Potential duplicate medications found. Please discuss with provider.    STOP taking these medications     acetaminophen (TYLENOL) 650 MG CR tablet         Today   VITAL SIGNS:  Blood pressure 133/61, pulse 90, temperature 98.3  F (36.8 C), temperature source Oral, resp. rate 18, height  (1.651 m), weight 73.5 kg (162 lb 0.6 oz), SpO2 93 %.  I/O:  No intake or output data in the 24 hours ending 08/19/15 1625  PHYSICAL EXAMINATION:  Physical Exam  GENERAL:  82 y.o.-year-old patient lying in the bed with no acute distress.  LUNGS: , no rales,rhonchi or crepitation. No use of accessory muscles of respiration. Very mild end expiratory wheezing. Good air entry. CARDIOVASCULAR: S1, S2 normal. No murmurs, rubs, or gallops.  ABDOMEN: Soft, non-tender, non-distended. Bowel sounds present. No organomegaly or mass.  NEUROLOGIC: Moves all 4 extremities. PSYCHIATRIC: The patient is alert and awake SKIN: No obvious rash, lesion, or ulcer.   DATA REVIEW:   CBC  Recent Labs Lab 08/14/15 0414  WBC 11.0  HGB 10.7*  HCT 32.4*  PLT 320    Chemistries   Recent Labs Lab 08/13/15 0919  08/15/15 0503  NA 135  < > 135  K 3.2*  < > 3.8  CL 97*  < > 97*  CO2 20*  < > 23  GLUCOSE 60*  < > 68  BUN 70*  < > 54*  CREATININE 5.47*  < > 4.86*  CALCIUM 8.3*  < > 8.4*  MG  --   --  2.0  AST 22  --   --   ALT 8*  --   --   ALKPHOS 88  --   --   BILITOT 1.6*  --   --   < > = values in this interval not displayed.  Cardiac Enzymes  Recent Labs Lab 08/13/15 1835  TROPONINI 0.07*    Microbiology Results  Results for orders placed or performed during the hospital encounter of 08/13/15  Blood culture (routine x 2)     Status: None   Collection Time: 08/13/15  9:48 AM  Result Value Ref Range Status   Specimen Description BLOOD LEFT AC  Final   Special Requests   Final    BOTTLES DRAWN AEROBIC AND ANAEROBIC ANA AER   Culture NO GROWTH 5 DAYS  Final   Report Status 08/18/2015 FINAL  Final  Blood culture (routine x 2)     Status: None   Collection Time: 08/13/15  9:48 AM  Result Value Ref Range Status   Specimen Description BLOOD LEFT ARM  Final   Special Requests BOTTLES DRAWN AEROBIC AND ANAEROBIC   Final   Culture NO GROWTH 5 DAYS  Final   Report Status 08/18/2015 FINAL  Final    RADIOLOGY:  No results found.  Follow up with PCP in 1 week.  Management plans discussed with the patient, family and they are  in agreement.  CODE STATUS:  Code Status History    Date Active Date Inactive Code Status Order ID Comments User Context   08/13/2015  1:01 PM 08/16/2015  4:09 PM Full Code [id number]  Altamese Dilling, MD Inpatient   06/18/2015 11:41 PM 06/25/2015  1:04 AM DNR [id number]  Ramonita Lab, MD Inpatient   06/07/2015  3:46 PM 06/07/2015  6:48 PM Full Code [id number]  Gwyneth Revels, DPM Inpatient   12/11/2014 10:46 PM 12/13/2014  9:38 PM DNR [id number]  Enedina Finner, MD Inpatient   12/11/2014  9:10 PM 12/11/2014 10:46 PM DNR [id number]  Enedina Finner, MD ED      TOTAL TIME TAKING CARE OF THIS PATIENT ON DAY OF DISCHARGE: more than 30 minutes.   Milagros Loll R M.D on 08/19/2015 at 4:25 PM  Between 7am to 6pm - Pager - [phone]  After 6pm go to [email or web address] - password [email or web address]  [facility]  Office  [phone]  CC: Primary care physician; [name], MD  Note: This dictation was prepared with Dragon dictation along with smaller phrase technology. Any transcriptional errors that result from this process are unintentional.

## 2015-09-14 ENCOUNTER — Encounter: Payer: Self-pay | Admitting: Emergency Medicine

## 2015-09-14 ENCOUNTER — Inpatient Hospital Stay
Admission: EM | Admit: 2015-09-14 | Discharge: 2015-09-18 | DRG: 270 | Disposition: A | Discharge status: Home / Self Care | Payer: Medicare Other | Attending: Internal Medicine | Admitting: Internal Medicine

## 2015-09-14 ENCOUNTER — Emergency Department: Payer: Medicare Other

## 2015-09-14 DIAGNOSIS — Z9841 Cataract extraction status, right eye: Secondary | ICD-10-CM

## 2015-09-14 DIAGNOSIS — Z7982 Long term (current) use of aspirin: Secondary | ICD-10-CM | POA: Diagnosis not present

## 2015-09-14 DIAGNOSIS — Z9842 Cataract extraction status, left eye: Secondary | ICD-10-CM | POA: Diagnosis not present

## 2015-09-14 DIAGNOSIS — E1151 Type 2 diabetes mellitus with diabetic peripheral angiopathy without gangrene: Secondary | ICD-10-CM | POA: Diagnosis present

## 2015-09-14 DIAGNOSIS — Z9889 Other specified postprocedural states: Secondary | ICD-10-CM

## 2015-09-14 DIAGNOSIS — H538 Other visual disturbances: Secondary | ICD-10-CM | POA: Diagnosis present

## 2015-09-14 DIAGNOSIS — I5032 Chronic diastolic (congestive) heart failure: Secondary | ICD-10-CM | POA: Diagnosis present

## 2015-09-14 DIAGNOSIS — Z7901 Long term (current) use of anticoagulants: Secondary | ICD-10-CM

## 2015-09-14 DIAGNOSIS — Z823 Family history of stroke: Secondary | ICD-10-CM

## 2015-09-14 DIAGNOSIS — Z885 Allergy status to narcotic agent status: Secondary | ICD-10-CM

## 2015-09-14 DIAGNOSIS — I69398 Other sequelae of cerebral infarction: Secondary | ICD-10-CM | POA: Diagnosis not present

## 2015-09-14 DIAGNOSIS — Z79899 Other long term (current) drug therapy: Secondary | ICD-10-CM | POA: Diagnosis not present

## 2015-09-14 DIAGNOSIS — M79604 Pain in right leg: Secondary | ICD-10-CM

## 2015-09-14 DIAGNOSIS — Z89422 Acquired absence of other left toe(s): Secondary | ICD-10-CM

## 2015-09-14 DIAGNOSIS — Z888 Allergy status to other drugs, medicaments and biological substances status: Secondary | ICD-10-CM | POA: Diagnosis not present

## 2015-09-14 DIAGNOSIS — N186 End stage renal disease: Secondary | ICD-10-CM | POA: Diagnosis present

## 2015-09-14 DIAGNOSIS — M109 Gout, unspecified: Secondary | ICD-10-CM | POA: Diagnosis present

## 2015-09-14 DIAGNOSIS — K219 Gastro-esophageal reflux disease without esophagitis: Secondary | ICD-10-CM | POA: Diagnosis present

## 2015-09-14 DIAGNOSIS — D631 Anemia in chronic kidney disease: Secondary | ICD-10-CM | POA: Diagnosis present

## 2015-09-14 DIAGNOSIS — M79605 Pain in left leg: Secondary | ICD-10-CM

## 2015-09-14 DIAGNOSIS — G2581 Restless legs syndrome: Secondary | ICD-10-CM | POA: Diagnosis present

## 2015-09-14 DIAGNOSIS — I132 Hypertensive heart and chronic kidney disease with heart failure and with stage 5 chronic kidney disease, or end stage renal disease: Secondary | ICD-10-CM | POA: Diagnosis present

## 2015-09-14 DIAGNOSIS — Z66 Do not resuscitate: Secondary | ICD-10-CM | POA: Diagnosis present

## 2015-09-14 DIAGNOSIS — Z794 Long term (current) use of insulin: Secondary | ICD-10-CM | POA: Diagnosis not present

## 2015-09-14 DIAGNOSIS — Z96643 Presence of artificial hip joint, bilateral: Secondary | ICD-10-CM | POA: Diagnosis present

## 2015-09-14 DIAGNOSIS — E892 Postprocedural hypoparathyroidism: Secondary | ICD-10-CM | POA: Diagnosis present

## 2015-09-14 DIAGNOSIS — E1140 Type 2 diabetes mellitus with diabetic neuropathy, unspecified: Secondary | ICD-10-CM | POA: Diagnosis present

## 2015-09-14 DIAGNOSIS — N2581 Secondary hyperparathyroidism of renal origin: Secondary | ICD-10-CM | POA: Diagnosis present

## 2015-09-14 DIAGNOSIS — E1122 Type 2 diabetes mellitus with diabetic chronic kidney disease: Secondary | ICD-10-CM | POA: Diagnosis present

## 2015-09-14 DIAGNOSIS — I998 Other disorder of circulatory system: Secondary | ICD-10-CM | POA: Diagnosis present

## 2015-09-14 DIAGNOSIS — L8990 Pressure ulcer of unspecified site, unspecified stage: Secondary | ICD-10-CM | POA: Diagnosis present

## 2015-09-14 DIAGNOSIS — Z992 Dependence on renal dialysis: Secondary | ICD-10-CM | POA: Diagnosis not present

## 2015-09-14 DIAGNOSIS — Z833 Family history of diabetes mellitus: Secondary | ICD-10-CM

## 2015-09-14 DIAGNOSIS — Z91041 Radiographic dye allergy status: Secondary | ICD-10-CM | POA: Diagnosis not present

## 2015-09-14 DIAGNOSIS — L89610 Pressure ulcer of right heel, unstageable: Secondary | ICD-10-CM | POA: Diagnosis present

## 2015-09-14 DIAGNOSIS — N3281 Overactive bladder: Secondary | ICD-10-CM | POA: Diagnosis present

## 2015-09-14 DIAGNOSIS — Z9049 Acquired absence of other specified parts of digestive tract: Secondary | ICD-10-CM

## 2015-09-14 DIAGNOSIS — Z800 Family history of malignant neoplasm of digestive organs: Secondary | ICD-10-CM

## 2015-09-14 DIAGNOSIS — I739 Peripheral vascular disease, unspecified: Secondary | ICD-10-CM

## 2015-09-14 DIAGNOSIS — I482 Chronic atrial fibrillation, unspecified: Secondary | ICD-10-CM | POA: Diagnosis present

## 2015-09-14 DIAGNOSIS — Z886 Allergy status to analgesic agent status: Secondary | ICD-10-CM

## 2015-09-14 LAB — BASIC METABOLIC PANEL
Anion gap: 13 (ref 5–15)
BUN: 34 mg/dL — AB (ref 6–20)
CALCIUM: 8 mg/dL — AB (ref 8.9–10.3)
CO2: 22 mmol/L (ref 22–32)
CREATININE: 3.1 mg/dL — AB (ref 0.44–1.00)
Chloride: 95 mmol/L — ABNORMAL LOW (ref 101–111)
GFR calc non Af Amer: 13 mL/min — ABNORMAL LOW (ref >60)
GFR, EST AFRICAN AMERICAN: 15 mL/min — AB (ref >60)
Glucose, Bld: 216 mg/dL — ABNORMAL HIGH (ref 65–99)
Potassium: 3 mmol/L — ABNORMAL LOW (ref 3.5–5.1)
SODIUM: 130 mmol/L — AB (ref 135–145)

## 2015-09-14 LAB — CBC WITH DIFFERENTIAL/PLATELET
Basophils Absolute: 0.1 10*3/uL (ref 0–0.1)
Basophils Relative: 1 %
Eosinophils Absolute: 0.2 10*3/uL (ref 0–0.7)
Eosinophils Relative: 2 %
HEMATOCRIT: 32.8 % — AB (ref 35.0–47.0)
HEMOGLOBIN: 10.9 g/dL — AB (ref 12.0–16.0)
LYMPHS ABS: 0.8 10*3/uL — AB (ref 1.0–3.6)
LYMPHS PCT: 8 %
MCH: 30.9 pg (ref 26.0–34.0)
MCHC: 33.2 g/dL (ref 32.0–36.0)
MCV: 93.3 fL (ref 80.0–100.0)
MONOS PCT: 10 %
Monocytes Absolute: 1 10*3/uL — ABNORMAL HIGH (ref 0.2–0.9)
NEUTROS ABS: 7.9 10*3/uL — AB (ref 1.4–6.5)
NEUTROS PCT: 79 %
Platelets: 250 10*3/uL (ref 150–440)
RBC: 3.52 MIL/uL — AB (ref 3.80–5.20)
RDW: 17 % — ABNORMAL HIGH (ref 11.5–14.5)
WBC: 9.9 10*3/uL (ref 3.6–11.0)

## 2015-09-14 LAB — PROTIME-INR
INR: 1.89
PROTHROMBIN TIME: 21.6 s — AB (ref 11.4–15.0)

## 2015-09-14 LAB — GLUCOSE, CAPILLARY: Glucose-Capillary: 215 mg/dL — ABNORMAL HIGH (ref 65–99)

## 2015-09-14 MED ORDER — ONDANSETRON HCL 4 MG PO TABS: 4.0000 mg | ORAL_TABLET | Freq: Four times a day (QID) | ORAL | Status: DC | PRN

## 2015-09-14 MED ORDER — SENNOSIDES-DOCUSATE SODIUM 8.6-50 MG PO TABS: 1.0000 | ORAL_TABLET | Freq: Two times a day (BID) | ORAL | Status: DC | PRN

## 2015-09-14 MED ORDER — ONDANSETRON HCL 4 MG/2ML IJ SOLN: 4.0000 mg | Freq: Four times a day (QID) | INTRAMUSCULAR | Status: DC | PRN

## 2015-09-14 MED ORDER — WARFARIN SODIUM 2 MG PO TABS
3.0000 mg | ORAL_TABLET | Freq: Every day | ORAL | Status: DC
  Administered 2015-09-14 – 2015-09-15 (×2): 3 mg via ORAL
  Filled 2015-09-14 (×2): qty 1

## 2015-09-14 MED ORDER — WARFARIN - PHYSICIAN DOSING INPATIENT: Freq: Every day | Status: DC

## 2015-09-14 MED ORDER — INSULIN ASPART 100 UNIT/ML ~~LOC~~ SOLN
0.0000 [IU] | Freq: Three times a day (TID) | SUBCUTANEOUS | Status: DC
  Administered 2015-09-15: 2 [IU] via SUBCUTANEOUS
  Administered 2015-09-15: 3 [IU] via SUBCUTANEOUS
  Administered 2015-09-15: 1 [IU] via SUBCUTANEOUS
  Administered 2015-09-17: 3 [IU] via SUBCUTANEOUS
  Administered 2015-09-18: 2 [IU] via SUBCUTANEOUS
  Administered 2015-09-18: 3 [IU] via SUBCUTANEOUS
  Filled 2015-09-14: qty 2
  Filled 2015-09-14 (×2): qty 1
  Filled 2015-09-14 (×2): qty 3
  Filled 2015-09-14: qty 2

## 2015-09-14 MED ORDER — POTASSIUM CHLORIDE IN NACL 20-0.9 MEQ/L-% IV SOLN
INTRAVENOUS | Status: DC
Start: 2015-09-14 — End: 2015-09-15
  Administered 2015-09-14 – 2015-09-15 (×2): via INTRAVENOUS
  Filled 2015-09-14 (×3): qty 1000

## 2015-09-14 MED ORDER — ACETAMINOPHEN 325 MG PO TABS: 650.0000 mg | ORAL_TABLET | Freq: Four times a day (QID) | ORAL | Status: DC | PRN

## 2015-09-14 MED ORDER — DIPHENHYDRAMINE HCL 25 MG PO CAPS: 25.0000 mg | ORAL_CAPSULE | Freq: Four times a day (QID) | ORAL | Status: DC | PRN

## 2015-09-14 MED ORDER — GABAPENTIN 100 MG PO CAPS: 100.0000 mg | ORAL_CAPSULE | Freq: Every day | ORAL | Status: DC

## 2015-09-14 MED ORDER — SODIUM CHLORIDE 0.9 % IV BOLUS (SEPSIS)
500.0000 mL | Freq: Once | INTRAVENOUS | Status: AC
  Administered 2015-09-14: 500 mL via INTRAVENOUS

## 2015-09-14 MED ORDER — FAMOTIDINE IN NACL 20-0.9 MG/50ML-% IV SOLN
20.0000 mg | Freq: Every day | INTRAVENOUS | Status: DC
  Administered 2015-09-15 – 2015-09-18 (×4): 20 mg via INTRAVENOUS
  Filled 2015-09-14 (×5): qty 50

## 2015-09-14 MED ORDER — SODIUM CHLORIDE 0.9% FLUSH
3.0000 mL | Freq: Two times a day (BID) | INTRAVENOUS | Status: DC
  Administered 2015-09-15 – 2015-09-18 (×5): 3 mL via INTRAVENOUS

## 2015-09-14 MED ORDER — ONDANSETRON HCL 4 MG/2ML IJ SOLN
4.0000 mg | Freq: Once | INTRAMUSCULAR | Status: AC
  Administered 2015-09-14: 4 mg via INTRAVENOUS
  Filled 2015-09-14: qty 2

## 2015-09-14 MED ORDER — HEPARIN (PORCINE) IN NACL 100-0.45 UNIT/ML-% IJ SOLN
1450.0000 [IU]/h | INTRAMUSCULAR | Status: DC
  Administered 2015-09-14: 1100 [IU]/h via INTRAVENOUS
  Administered 2015-09-15: 1300 [IU]/h via INTRAVENOUS
  Administered 2015-09-16: 1450 [IU]/h via INTRAVENOUS
  Filled 2015-09-14 (×7): qty 250

## 2015-09-14 MED ORDER — HYDROCORTISONE NA SUCCINATE PF 100 MG IJ SOLR
200.0000 mg | Freq: Once | INTRAMUSCULAR | Status: AC
  Administered 2015-09-14: 200 mg via INTRAVENOUS
  Filled 2015-09-14: qty 4

## 2015-09-14 MED ORDER — GABAPENTIN 300 MG PO CAPS
300.0000 mg | ORAL_CAPSULE | ORAL | Status: DC
  Administered 2015-09-17: 300 mg via ORAL
  Filled 2015-09-14: qty 1

## 2015-09-14 MED ORDER — ASPIRIN 81 MG PO CHEW
81.0000 mg | CHEWABLE_TABLET | Freq: Every day | ORAL | Status: DC
  Administered 2015-09-15 – 2015-09-18 (×4): 81 mg via ORAL
  Filled 2015-09-14 (×4): qty 1

## 2015-09-14 MED ORDER — TRAMADOL HCL 50 MG PO TABS
50.0000 mg | ORAL_TABLET | Freq: Four times a day (QID) | ORAL | Status: DC | PRN
  Administered 2015-09-15 – 2015-09-16 (×3): 50 mg via ORAL
  Filled 2015-09-14 (×3): qty 1

## 2015-09-14 MED ORDER — FENTANYL CITRATE (PF) 100 MCG/2ML IJ SOLN
25.0000 ug | Freq: Once | INTRAMUSCULAR | Status: AC
  Administered 2015-09-14: 25 ug via INTRAVENOUS
  Filled 2015-09-14: qty 2

## 2015-09-14 MED ORDER — ALBUTEROL SULFATE (2.5 MG/3ML) 0.083% IN NEBU: 3.0000 mL | INHALATION_SOLUTION | Freq: Four times a day (QID) | RESPIRATORY_TRACT | Status: DC | PRN

## 2015-09-14 MED ORDER — INSULIN DETEMIR 100 UNIT/ML ~~LOC~~ SOLN
11.0000 [IU] | Freq: Two times a day (BID) | SUBCUTANEOUS | Status: DC
  Administered 2015-09-14 – 2015-09-18 (×7): 11 [IU] via SUBCUTANEOUS
  Filled 2015-09-14 (×12): qty 0.11

## 2015-09-14 MED ORDER — DILTIAZEM HCL ER COATED BEADS 120 MG PO CP24
120.0000 mg | ORAL_CAPSULE | Freq: Every day | ORAL | Status: DC
  Administered 2015-09-15 – 2015-09-18 (×4): 120 mg via ORAL
  Filled 2015-09-14 (×4): qty 1

## 2015-09-14 MED ORDER — NEPHRO-VITE 0.8 MG PO TABS
1.0000 | ORAL_TABLET | Freq: Every day | ORAL | Status: DC
  Administered 2015-09-15 – 2015-09-18 (×4): 1 via ORAL
  Filled 2015-09-14 (×7): qty 1

## 2015-09-14 MED ORDER — WARFARIN SODIUM 2 MG PO TABS: 2.0000 mg | ORAL_TABLET | Freq: Every day | ORAL | Status: DC

## 2015-09-14 MED ORDER — CALCIUM ACETATE (PHOS BINDER) 667 MG/5ML PO SOLN
1334.0000 mg | Freq: Three times a day (TID) | ORAL | Status: DC
  Administered 2015-09-15 – 2015-09-16 (×4): 1334 mg via ORAL
  Filled 2015-09-14 (×14): qty 10

## 2015-09-14 MED ORDER — IOPAMIDOL (ISOVUE-370) INJECTION 76%
125.0000 mL | Freq: Once | INTRAVENOUS | Status: AC | PRN
  Administered 2015-09-14: 125 mL via INTRAVENOUS

## 2015-09-14 MED ORDER — FUROSEMIDE 40 MG PO TABS
80.0000 mg | ORAL_TABLET | Freq: Two times a day (BID) | ORAL | Status: DC
  Administered 2015-09-15 – 2015-09-18 (×5): 80 mg via ORAL
  Filled 2015-09-14 (×5): qty 2

## 2015-09-14 MED ORDER — DIPHENHYDRAMINE HCL 50 MG/ML IJ SOLN
50.0000 mg | Freq: Once | INTRAMUSCULAR | Status: AC
  Administered 2015-09-14: 50 mg via INTRAVENOUS
  Filled 2015-09-14: qty 1

## 2015-09-14 MED ORDER — FENTANYL CITRATE (PF) 100 MCG/2ML IJ SOLN: 50.0000 ug | Freq: Once | INTRAMUSCULAR | Status: DC

## 2015-09-14 MED ORDER — GABAPENTIN 100 MG PO CAPS
100.0000 mg | ORAL_CAPSULE | ORAL | Status: DC
  Administered 2015-09-15 – 2015-09-16 (×2): 100 mg via ORAL
  Filled 2015-09-14 (×2): qty 1

## 2015-09-14 MED ORDER — MORPHINE SULFATE (PF) 2 MG/ML IV SOLN
2.0000 mg | INTRAVENOUS | Status: DC | PRN
  Administered 2015-09-14 – 2015-09-16 (×5): 2 mg via INTRAVENOUS
  Filled 2015-09-14 (×5): qty 1

## 2015-09-14 MED ORDER — PRAVASTATIN SODIUM 20 MG PO TABS
20.0000 mg | ORAL_TABLET | Freq: Every day | ORAL | Status: DC
  Administered 2015-09-15 – 2015-09-18 (×4): 20 mg via ORAL
  Filled 2015-09-14 (×4): qty 1

## 2015-09-14 MED ORDER — BISACODYL 10 MG RE SUPP: 10.0000 mg | Freq: Every day | RECTAL | Status: DC | PRN

## 2015-09-14 MED ORDER — DOCUSATE SODIUM 100 MG PO CAPS: 200.0000 mg | ORAL_CAPSULE | Freq: Two times a day (BID) | ORAL | Status: DC | PRN

## 2015-09-14 MED ORDER — PAROXETINE HCL 20 MG PO TABS
40.0000 mg | ORAL_TABLET | Freq: Every day | ORAL | Status: DC
  Administered 2015-09-14 – 2015-09-17 (×4): 40 mg via ORAL
  Filled 2015-09-14 (×4): qty 2

## 2015-09-14 MED ORDER — ACETAMINOPHEN 650 MG RE SUPP: 650.0000 mg | Freq: Four times a day (QID) | RECTAL | Status: DC | PRN

## 2015-09-14 NOTE — Consult Note (Signed)
Reason for Consult: Right leg pain Referring Physician: Dr. Blanchard [name] is an 82 y.o. female.  HPI: Pleasant 82 yof history of Afib on coumadin, PVD, CVA, DM with peripheral neuropathy and chronic foot wounds, ESRD on HD. Well known to the Vascular Surgery service. She notes ~ 3 weeks ago she developed right leg pain that was subtle and intermittent. Over the last few weeks she notes the pain became more persistent and quite painful especially in the last few days. She does not ambulate. She denies any alleviating or aggravating factors. She has had a 5th left toe amputation in the past for diabetic disease.  Past Medical History  Diagnosis Date  . Chronic kidney disease   . Dysrhythmia   . Diabetes mellitus without complication ([place])   . Hypertension   . Stroke ([place])   . Arthritis     gout  . Dialysis patient ([place])   . Pleural effusion   . Pulmonary hypertension ([place])   . Renal insufficiency   . CHF (congestive heart failure) ([place])   . Peripheral vascular disease ([place])   . Shortness of breath dyspnea     with exertion  . GERD (gastroesophageal reflux disease)   . Anemia   . Restless leg syndrome   . Atrial fibrillation [facility])     Past Surgical History  Procedure Laterality Date  . Cholecystectomy    . Peripheral vascular catheterization N/A 10/08/2014    Procedure: A/V Shuntogram/Fistulagram;  Surgeon: Algernon Huxley, MD;  Location: [place] [facility];  Service: Cardiovascular;  Laterality: N/A;  . Joint replacement      bilateral hip  . Peripheral vascular catheterization N/A 03/04/2015    Procedure: A/V Shuntogram/Fistulagram;  Surgeon: Algernon Huxley, MD;  Location: [place] [facility];  Service: Cardiovascular;  Laterality: N/A;  . Peripheral vascular catheterization N/A 03/04/2015    Procedure: A/V Shunt Intervention;  Surgeon: Algernon Huxley, MD;  Location: [place] [facility];  Service: Cardiovascular;  Laterality: N/A;  . Fistulagram ([facility] hx)    .  Parathyroidectomy    . Tonsillectomy    . Cardiac catheterization    . Eye surgery    . Cataract extraction, bilateral    . Amputation toe Left 06/07/2015    Procedure: AMPUTATION TOE;  Surgeon: Samara Deist, DPM;  Location: [facility];  Service: Podiatry;  Laterality: Left;  Marland Kitchen [employer] bladder interstem      currently turned off for MRI in January  . Endarterectomy Left 07/26/2015    Procedure: ENDARTERECTOMY CAROTID;  Surgeon: Katha Cabal, MD;  Location: [facility];  Service: Vascular;  Laterality: Left;  . Thyroidectomy, partial      Family History  Problem Relation Age of Onset  . Colon cancer Mother   . Stroke Father   . Diabetes Brother     Social History:  reports that she has never smoked. She has never used smokeless tobacco. She reports that she does not drink alcohol or use illicit drugs.  Allergies:  Allergies  Allergen Reactions  . Codeine Other (See Comments)    Reaction:  Confusion/Hallucinations   . Contrast Media [Iodinated Diagnostic Agents] Other (See Comments)    "skin peel"  . Oxycodone Other (See Comments)    Reaction:  Confusion/Hallucinations   . Quinine Derivatives Rash    Medications: I have reviewed the patient's current medications.  Results for orders placed or performed during the hospital encounter of 09/14/15 (from the past 48 hour(s))  CBC  with Differential     Status: Abnormal   Collection Time: 09/14/15  6:02 PM  Result Value Ref Range   WBC 9.9 3.6 - 11.0 K/uL   RBC 3.52 (L) 3.80 - 5.20 MIL/uL   Hemoglobin 10.9 (L) 12.0 - 16.0 g/dL   HCT 32.8 (L) 35.0 - 47.0 %   MCV 93.3 80.0 - 100.0 fL   MCH 30.9 26.0 - 34.0 pg   MCHC 33.2 32.0 - 36.0 g/dL   RDW 17.0 (H) 11.5 - 14.5 %   Platelets 250 150 - 440 K/uL   Neutrophils Relative % 79 %   Neutro Abs 7.9 (H) 1.4 - 6.5 K/uL   Lymphocytes Relative 8 %   Lymphs Abs 0.8 (L) 1.0 - 3.6 K/uL   Monocytes Relative 10 %   Monocytes Absolute 1.0 (H) 0.2 - 0.9 K/uL   Eosinophils Relative 2 %    Eosinophils Absolute 0.2 0 - 0.7 K/uL   Basophils Relative 1 %   Basophils Absolute 0.1 0 - 0.1 K/uL  Protime-INR     Status: Abnormal   Collection Time: 09/14/15  6:02 PM  Result Value Ref Range   Prothrombin Time 21.6 (H) 11.4 - 15.0 seconds   INR 1.61   Basic metabolic panel     Status: Abnormal   Collection Time: 09/14/15  6:02 PM  Result Value Ref Range   Sodium 130 (L) 135 - 145 mmol/L   Potassium 3.0 (L) 3.5 - 5.1 mmol/L   Chloride 95 (L) 101 - 111 mmol/L   CO2 22 22 - 32 mmol/L   Glucose, Bld 216 (H) 65 - 99 mg/dL   BUN 34 (H) 6 - 20 mg/dL   Creatinine, Ser 3.10 (H) 0.44 - 1.00 mg/dL   Calcium 8.0 (L) 8.9 - 10.3 mg/dL   GFR calc non Af Amer 13 (L) >60 mL/min   GFR calc Af Amer 15 (L) >60 mL/min    Comment: (NOTE) The eGFR has been calculated using the CKD EPI equation. This calculation has not been validated in all clinical situations. eGFR's persistently <60 mL/min signify possible Chronic Kidney Disease.    Anion gap 13 5 - 15   CTA: Right leg: Atheromatous but widely patent right common femoral and superficial femoral arteries. Occluded popliteal artery at the level of the knee with well-developed collaterals providing prompt and symmetric reconstituted collateral flow. On reformats, this appears to be an eccentric luminal lesion with defined margins and internal calcification, possible remote embolus. Symmetrically timed runoff that is 3 vessel at the ankle.  Left leg: Diffusely atheromatous but widely patent left common femoral and superficial femoral arteries. The left profunda femoris is widely patent. Mild to moderate popliteal narrowing on the left. Three-vessel runoff at the ankle Ct Angio Ao+bifem W/cm &/or Wo/cm  09/14/2015  CLINICAL DATA:  Right leg pain. EXAM: CT ANGIOGRAPHY AOBIFEM WITHOUT AND WITH CONTRAST TECHNIQUE: CT angiography of the aorta and lower extremity arteries was performed with reformats generated. CONTRAST:  125 cc Isovue 370  intravenous COMPARISON:  Chest CT 12/12/2014 FINDINGS: Replaced right hepatic artery to the SMA. Accessory right renal artery. Otherwise, aortic branching is standard. There is diffuse atherosclerotic calcification of the aorta and visceral branches. No major branch occlusion is seen. Negative for aneurysm. No inflow disease to explain leg symptoms. Right leg: Atheromatous but widely patent right common femoral and superficial femoral arteries. Occluded popliteal artery at the level of the knee with well-developed collaterals providing prompt and symmetric reconstituted collateral flow. On  reformats, this appears to be an eccentric luminal lesion with defined margins and internal calcification, possible remote embolus. Symmetrically timed runoff that is 3 vessel at the ankle. Left leg: Diffusely atheromatous but widely patent left common femoral and superficial femoral arteries. The left profunda femoris is widely patent. Mild to moderate popliteal narrowing on the left. Three-vessel runoff at the ankle. Lower chest and abdominal wall: Cardiomegaly. Septal thickening at the bases with trace effusions, loculated complex appearing on the left with subpleural scarring or atelectasis. This is likely developing round atelectasis given 2016 chest CT appearance 8 mm nodule over the right diaphragm is stable since comparison CT. Hepatobiliary: No focal liver abnormality.No evidence of biliary obstruction or stone. Pancreas: Unremarkable. Spleen: Sub cm low-density in the medial spleen is nonspecific. Adrenals/Urinary Tract: Negative adrenals. Marked lack of enhancement of the kidneys in this patient with end-stage renal disease. 14 mm right upper pole calculus. No hydronephrosis or ureteral calculus. Bladder is obscured by streak artifact from hip prostheses. Reproductive:Negative Stomach/Bowel:  No obstruction. No appendicitis. Vascular/Lymphatic: No acute vascular abnormality. No mass or adenopathy. Peritoneal: No ascites  or pneumoperitoneum. Musculoskeletal: L2-3 endplate erosions, sclerosis, and surrounding paraspinal inflammation. No indication of epidural collection or high-grade stenosis. There is mineralization at the level of the disc. Marked osteopenia.  Baker cyst on the left. Review of the MIP images confirms the above findings. IMPRESSION: 1. Occluded or critically stenotic right popliteal artery, chronic appearing given the degree of collateral formation with symmetric three-vessel runoff. The stenotic lesion has an eccentric luminal based appearance and may have been embolic. 2. Negative for inflow disease. 3. L2-3 discitis versus dialysis related spondyloarthropathy, associated disc calcification favoring the latter. 4. Possible mild pulmonary edema. 5. Chronic small left pleural effusion and scarring. 6. 8 mm right lower lobe pulmonary nodule but is stable from 12/12/2014. 1 year follow-up CT could document stability (assuming patient is a treatment candidate). Electronically Signed   By: Monte Fantasia M.D.   On: 09/14/2015 20:36   US Venous Img Lower Unilateral Right  09/14/2015  CLINICAL DATA:  82 year old female with a history of leg pain EXAM: RIGHT LOWER EXTREMITY VENOUS DOPPLER ULTRASOUND TECHNIQUE: Gray-scale sonography with graded compression, as well as color Doppler and duplex ultrasound were performed to evaluate the lower extremity deep venous systems from the level of the common femoral vein and including the common femoral, femoral, profunda femoral, popliteal and calf veins including the posterior tibial, peroneal and gastrocnemius veins when visible. The superficial great saphenous vein was also interrogated. Spectral Doppler was utilized to evaluate flow at rest and with distal augmentation maneuvers in the common femoral, femoral and popliteal veins. COMPARISON:  None. FINDINGS: Contralateral Common Femoral Vein: Respiratory phasicity is normal and symmetric with the symptomatic side. No evidence  of thrombus. Normal compressibility. Common Femoral Vein: No evidence of thrombus. Normal compressibility, respiratory phasicity and response to augmentation. Saphenofemoral Junction: No evidence of thrombus. Normal compressibility and flow on color Doppler imaging. Profunda Femoral Vein: No evidence of thrombus. Normal compressibility and flow on color Doppler imaging. Femoral Vein: No evidence of thrombus. Normal compressibility, respiratory phasicity and response to augmentation. Popliteal Vein: No evidence of thrombus. Normal compressibility, respiratory phasicity and response to augmentation. Calf Veins: No evidence of thrombus. Normal compressibility and flow on color Doppler imaging. Superficial Great Saphenous Vein: No evidence of thrombus. Normal compressibility and flow on color Doppler imaging. Other Findings:  None. IMPRESSION: Sonographic survey of the right lower extremity negative for DVT. Signed, Dulcy Fanny.  Earleen Newport, DO [facility] Radiology Electronically Signed   By: Corrie Mckusick D.O.   On: 09/14/2015 15:07    Review of Systems  Constitutional: Negative.   HENT: Negative.   Eyes: Negative.   Respiratory: Negative.   Cardiovascular: Negative for chest pain, palpitations, orthopnea and PND.       Bilateral leg pain. Right worse than left ~3 weeks. With progression of foot pain over the last 1 week  Gastrointestinal: Negative.   Genitourinary: Negative.   Musculoskeletal: Positive for falls.       Right leg pain X 3 weeks  Skin:       Chronic diabetic lower extremity discoloration. Right heel diabetic ulcer Toes- healed scabbing  Neurological: Negative.   Endo/Heme/Allergies: Negative.   Psychiatric/Behavioral: Negative.    Blood pressure 115/97, pulse 98, temperature 98 F (36.7 C), resp. rate 16, height 5' 5"  (1.651 m), weight 72.576 kg (160 lb), SpO2 98 %. Physical Exam  Constitutional: She is oriented to person, place, and  time. She appears well-nourished. No distress.  HENT:  Head: Normocephalic and atraumatic.  Eyes: Pupils are equal, round, and reactive to light.  Neck: Normal range of motion. Neck supple.  Well healed left neck incision  No carotid bruit  Cardiovascular:  Irregular  Extremities: Left- warm, chronic lower limb discoloration, motor intact, sensory baseline- peripheral neuropathy  Right: warm to distal foot, chronic lower limb discoloration toes cool to touch, motor, sensory at baseline- peripheral neuropathy. Chronic diabetic heel ulcer, healing scabs over toes.  Respiratory: Effort normal and breath sounds normal.  GI: Soft. She exhibits no distension. There is no tenderness. There is no rebound and no guarding.  Musculoskeletal: Normal range of motion. She exhibits no edema.  Neurological: She is alert and oriented to person, place, and time.  Skin: Skin is warm.  Psychiatric: She has a normal mood and affect.    Assessment/Plan:   Chronic PVD with a remote ~3- 4 weeks ago embolic event to the right popliteal artery.  Upon extensive review of the CTA there is robust 3 vessel runoff to the foot, indicating a chronic severe stenosis/ chronic occlusion  that has developed collateral circulation.  Her current symptomology is likely a combination of diabetic disease and probable microemboli to the toes.  Her INR is subtherapeutic which is the likely etiology of her current symptoms.  Recommend- Aggressive anticoagulation to ensure INR becomes therapeutic.                         Cardiology evaluation, ECHO                        No surgical intervention at this time, however, will follow patient closely for any change                         in clinical status.                                                                    Extensive discussion with patient and son regarding plan and concerns, understanding expressed.  Zetha Kuhar A 09/14/2015, 8:23 PM

## 2015-09-14 NOTE — ED Notes (Signed)
Called pharmacy to request heparin 

## 2015-09-14 NOTE — Progress Notes (Addendum)
Pt arrived on unit. VSS. Skin was assessed, co-worker, [name], RN was asked to be the second verification. Multiple wounds and bruises noted in assessment. Will continue to monitor pt.   [name]

## 2015-09-14 NOTE — ED Notes (Signed)
Called floor to let them know pt on the way up 

## 2015-09-14 NOTE — Progress Notes (Signed)
ANTICOAGULATION CONSULT NOTE - Initial Consult  Pharmacy Consult for Heparin  Indication: atrial fibrillation  Allergies  Allergen Reactions  . Codeine Other (See Comments)    Reaction:  Confusion/Hallucinations   . Contrast Media [Iodinated Diagnostic Agents] Other (See Comments)    "skin peel"  . Oxycodone Other (See Comments)    Reaction:  Confusion/Hallucinations   . Quinine Derivatives Rash    Patient Measurements: Height: 5\' 5"  (165.1 cm) Weight: 160 lb (72.576 kg) IBW/kg (Calculated) : 57 Heparin Dosing Weight: 71.6 kg   Vital Signs: Temp: 98 F (36.7 C) (04/15 1352) BP: 115/97 mmHg (04/15 1900) Pulse Rate: 98 (04/15 1900)  Labs:  Recent Labs  09/14/15 1802  HGB 10.9*  HCT 32.8*  PLT 250  LABPROT 21.6*  INR 1.89  CREATININE 3.10*    Estimated Creatinine Clearance: 14.7 mL/min (by C-G formula based on Cr of 3.1).   Medical History: Past Medical History  Diagnosis Date  . Chronic kidney disease   . Dysrhythmia   . Diabetes mellitus without complication ([facility])   . Hypertension   . Stroke ([facility])   . Arthritis     gout  . Dialysis patient ([facility])   . Pleural effusion   . Pulmonary hypertension ([facility])   . Renal insufficiency   . CHF (congestive heart failure) ([facility])   . Peripheral vascular disease ([facility])   . Shortness of breath dyspnea     with exertion  . GERD (gastroesophageal reflux disease)   . Anemia   . Restless leg syndrome   . Atrial fibrillation ([facility])     Medications:   (Not in a hospital admission)  Assessment: Pharmacy consulted to dose heparin in this 82 year old female admitted with Afib.  Pt on warfarin at home but INR currently subtherapeutic.   4/15 : INR = 1.89  Will not bolus this pt.  CrCl = 14.7 ml/min  Goal of Therapy:  Heparin level 0.3-0.7 units/ml Monitor platelets by anticoagulation protocol: Yes   Plan:  Start heparin infusion at 1100 units/hr  Will draw 1st HL 8 hrs after start of drip on 4/16 @  Will check CBC  and HL daily.   Marissia Blackham D 09/14/2015,8:10 PM

## 2015-09-14 NOTE — ED Notes (Signed)
Had a wound previously on leg with hx of pvd and neuropathy. Fell Tuesday night with increased pain since. Rt leg cooler than left - able move leg. States the dr gave a vascular referal yesterday but hasn't made appt.

## 2015-09-14 NOTE — ED Provider Notes (Signed)
[facility] Emergency Department Provider Note  ____________________________________________  Time seen: Approximately 550 PM  I have reviewed the triage vital signs and the nursing notes.   HISTORY  Chief Complaint Leg Injury    HPI [name] is a 82 y.o. female with a history of atrial fibrillation, stroke on Coumadin who is presenting to the emergency department with 1 week of increasing right lower extremity pain. She says that the right lower extremity has also become cold and increasingly discolored. She says she is compliant with her Coumadin.Also a chronic heel ulcer which is to the majority over the overlying skin of the Achilles. No tendon exposure. It appears through the subcutaneous layers. Patient on dialysis with last treatment earlier today for 2 hours.  Past Medical History  Diagnosis Date  . Chronic kidney disease   . Dysrhythmia   . Diabetes mellitus without complication (HCC)   . Hypertension   . Stroke (HCC)   . Arthritis     gout  . Dialysis patient (HCC)   . Pleural effusion   . Pulmonary hypertension (HCC)   . Renal insufficiency   . CHF (congestive heart failure) (HCC)   . Peripheral vascular disease (HCC)   . Shortness of breath dyspnea     with exertion  . GERD (gastroesophageal reflux disease)   . Anemia   . Restless leg syndrome   . Atrial fibrillation [facility])     Patient Active Problem List   Diagnosis Date Noted  . Pneumonia 08/13/2015  . Carotid stenosis 07/26/2015  . Amputation of fifth toe, left, traumatic (HCC) 06/24/2015  . Diabetes mellitus (HCC) 06/23/2015  . Diabetic neuropathy (HCC) 06/23/2015  . Chronic atrial fibrillation (HCC) 06/23/2015  . Left carotid artery stenosis 06/23/2015  . CVA (cerebral infarction) 06/21/2015  . Pressure ulcer 06/19/2015  . End stage renal disease (HCC) 06/18/2015  . Respiratory distress 12/11/2014    Past Surgical History  Procedure Laterality Date  .  Cholecystectomy    . Peripheral vascular catheterization N/A 10/08/2014    Procedure: A/V Shuntogram/Fistulagram;  Surgeon: Annice Needy, MD;  Location: [facility] INVASIVE CV LAB;  Service: Cardiovascular;  Laterality: N/A;  . Joint replacement      bilateral hip  . Peripheral vascular catheterization N/A 03/04/2015    Procedure: A/V Shuntogram/Fistulagram;  Surgeon: Annice Needy, MD;  Location: [facility] INVASIVE CV LAB;  Service: Cardiovascular;  Laterality: N/A;  . Peripheral vascular catheterization N/A 03/04/2015    Procedure: A/V Shunt Intervention;  Surgeon: Annice Needy, MD;  Location: [facility] INVASIVE CV LAB;  Service: Cardiovascular;  Laterality: N/A;  . Fistulagram ([facility] hx)    . Parathyroidectomy    . Tonsillectomy    . Cardiac catheterization    . Eye surgery    . Cataract extraction, bilateral    . Amputation toe Left 06/07/2015    Procedure: AMPUTATION TOE;  Surgeon: Gwyneth Revels, DPM;  Location: [facility] ORS;  Service: Podiatry;  Laterality: Left;  Marland Kitchen Med[employer] bladder interstem      currently turned off for MRI in January  . Endarterectomy Left 07/26/2015    Procedure: ENDARTERECTOMY CAROTID;  Surgeon: Renford Dills, MD;  Location: [facility] ORS;  Service: Vascular;  Laterality: Left;  . Thyroidectomy, partial      Current Outpatient Rx  Name  Route  Sig  Dispense  Refill  . acetaminophen (TYLENOL) 500 MG tablet   Oral   Take 1,000 mg by mouth 2 (two) times daily.          Marland Kitchen  albuterol (PROVENTIL HFA;VENTOLIN HFA) 108 (90 Base) MCG/ACT inhaler   Inhalation   Inhale 1-2 puffs into the lungs every 6 (six) hours as needed for wheezing or shortness of breath.         Marland Kitchen aspirin 81 MG chewable tablet   Oral   Chew 1 tablet (81 mg total) by mouth daily.   30 tablet   6   . Biotin 1 MG CAPS   Oral   Take 1 mg by mouth daily.          . bisacodyl (DULCOLAX) 5 MG EC tablet   Oral   Take 10 mg by mouth daily as needed for moderate constipation.          . calcium acetate, Phos  Binder, (PHOSLYRA) 667 MG/5ML SOLN   Oral   Take 1,334 mg by mouth 3 (three) times daily with meals.         Marland Kitchen diltiazem (TIAZAC) 120 MG 24 hr capsule   Oral   Take 120 mg by mouth daily.         . diphenhydrAMINE (BENADRYL) 25 mg capsule   Oral   Take 1 capsule (25 mg total) by mouth every 6 (six) hours as needed (rash, itching).   30 capsule   0   . docusate sodium (COLACE) 100 MG capsule   Oral   Take 200 mg by mouth 2 (two) times daily as needed for mild constipation.         . folic acid-vitamin b complex-vitamin c-selenium-zinc (DIALYVITE) 3 MG TABS tablet   Oral   Take 1 tablet by mouth daily.         . furosemide (LASIX) 80 MG tablet   Oral   Take 80 mg by mouth 2 (two) times daily.         Marland Kitchen gabapentin (NEURONTIN) 100 MG capsule   Oral   Take 100 mg by mouth at bedtime. Pt takes on Sunday, Monday, Wednesday, and Friday.         . gabapentin (NEURONTIN) 300 MG capsule   Oral   Take 300 mg by mouth daily. Pt takes on Tuesday, Thursday, and Saturday after dialysis.         . Insulin Detemir (LEVEMIR) 100 UNIT/ML Pen   Subcutaneous   Inject 11 Units into the skin 2 (two) times daily. 0730, 1730         . levofloxacin (LEVAQUIN) 500 MG tablet   Oral   Take 1 tablet (500 mg total) by mouth every other day.   3 tablet   0   . lidocaine-prilocaine (EMLA) cream   Topical   Apply 1 application topically as needed (prior to accessing port).          . nystatin (MYCOSTATIN/NYSTOP) 100000 UNIT/GM POWD   Topical   Apply topically 2 (two) times daily as needed.         . Omega-3 Fatty Acids (FISH OIL CONCENTRATE) 300 MG CAPS   Oral   Take 1 capsule by mouth daily.         Marland Kitchen PARoxetine (PAXIL) 40 MG tablet   Oral   Take 40 mg by mouth at bedtime.          . pravastatin (PRAVACHOL) 20 MG tablet   Oral   Take 1 tablet (20 mg total) by mouth daily.   30 tablet   5   . ranitidine (ZANTAC) 150 MG tablet   Oral   Take 150 mg  by mouth 2  (two) times daily.          Marland Kitchen. senna-docusate (SENOKOT-S) 8.6-50 MG tablet   Oral   Take 1 tablet by mouth 2 (two) times daily as needed.          . traMADol (ULTRAM) 50 MG tablet   Oral   Take 1 tablet (50 mg total) by mouth every 6 (six) hours as needed for moderate pain.   30 tablet   0   . warfarin (COUMADIN) 3 MG tablet   Oral   Take 1.5 mg by mouth daily. Everyday except Mondays           Allergies Codeine; Contrast media; Oxycodone; and Quinine derivatives  Family History  Problem Relation Age of Onset  . Colon cancer Mother   . Stroke Father   . Diabetes Brother     Social History Social History  Substance Use Topics  . Smoking status: Never Smoker   . Smokeless tobacco: Never Used  . Alcohol Use: No    Review of Systems Constitutional: No fever/chills Eyes: No visual changes. ENT: No sore throat. Cardiovascular: Denies chest pain. Respiratory: Denies shortness of breath. Gastrointestinal: No abdominal pain.  No nausea, no vomiting.  No diarrhea.  No constipation. Genitourinary: Negative for dysuria. Musculoskeletal: Negative for back pain. Skin: Negative for rash. Neurological: Negative for headaches, focal weakness or numbness.  10-point ROS otherwise negative.  ____________________________________________   PHYSICAL EXAM:  VITAL SIGNS: ED Triage Vitals  Enc Vitals Group     BP 09/14/15 1352 135/52 mmHg     Pulse Rate 09/14/15 1352 85     Resp 09/14/15 1352 18     Temp 09/14/15 1352 98 F (36.7 C)     Temp src --      SpO2 09/14/15 1352 95 %     Weight 09/14/15 1352 160 lb (72.576 kg)     Height 09/14/15 1352 5\' 5"  (1.651 m)     Head Cir --      Peak Flow --      Pain Score 09/14/15 1353 10     Pain Loc --      Pain Edu? --      Excl. in GC? --     Constitutional: Alert and oriented. Well appearing and in no acute distress. Eyes: Conjunctivae are normal. PERRL. EOMI. Head: Atraumatic. Nose: No  congestion/rhinnorhea. Mouth/Throat: Mucous membranes are moist.   Neck: No stridor.   Cardiovascular: Normal rate, regular rhythm. Grossly normal heart sounds.  Good peripheral circulation.Right upper extremity dialysis access with palpable thrill. Respiratory: Normal respiratory effort.  No retractions. Lungs CTAB. Gastrointestinal: Soft and nontender. No distention. No CVA tenderness. Musculoskeletal: Right lower extremity with ecchymotic discoloration. The extremity is cold and there is no palpable or Doppler pulses detected to the dorsalis pedis or posterior tibial areas. Left lower extremity is warm, with good color and easily palpable dorsalis pedis pulse. Neurologic:  Normal speech and language. No gross focal neurologic deficits are appreciated. No gait instability. Skin: Ulceration over the Achilles which is is 3 cm in the horizontal plane by 6 cm to the right lower extremity. There is a necrotic center to it which is about 1 cm and circular.  Psychiatric: Mood and affect are normal. Speech and behavior are normal.  ____________________________________________   LABS (all labs ordered are listed, but only abnormal results are displayed)  Labs Reviewed  CBC WITH DIFFERENTIAL/PLATELET - Abnormal; Notable for the following:    RBC  3.52 (*)    Hemoglobin 10.9 (*)    HCT 32.8 (*)    RDW 17.0 (*)    Neutro Abs 7.9 (*)    Lymphs Abs 0.8 (*)    Monocytes Absolute 1.0 (*)    All other components within normal limits  PROTIME-INR - Abnormal; Notable for the following:    Prothrombin Time 21.6 (*)    All other components within normal limits  BASIC METABOLIC PANEL - Abnormal; Notable for the following:    Sodium 130 (*)    Potassium 3.0 (*)    Chloride 95 (*)    Glucose, Bld 216 (*)    BUN 34 (*)    Creatinine, Ser 3.10 (*)    Calcium 8.0 (*)    GFR calc non Af Amer 13 (*)    GFR calc Af Amer 15 (*)    All other components within normal limits  HEPARIN LEVEL (UNFRACTIONATED)    ____________________________________________  EKG   ____________________________________________  RADIOLOGY  IMPRESSION: 1. Occluded or critically stenotic right popliteal artery, chronic appearing given the degree of collateral formation with symmetric three-vessel runoff. The stenotic lesion has an eccentric luminal based appearance and may have been embolic. 2. Negative for inflow disease. 3. L2-3 discitis versus dialysis related spondyloarthropathy, associated disc calcification favoring the latter. 4. Possible mild pulmonary edema. 5. Chronic small left pleural effusion and scarring. 6. 8 mm right lower lobe pulmonary nodule but is stable from 12/12/2014. 1 year follow-up CT could document stability (assuming patient is a treatment candidate).   Electronically Signed By: Marnee Spring M.D. On: 09/14/2015 20:36 ____________________________________________   PROCEDURES   ____________________________________________   INITIAL IMPRESSION / ASSESSMENT AND PLAN / ED COURSE  Pertinent labs & imaging results that were available during my care of the patient were reviewed by me and considered in my medical decision making (see chart for details).  Immediately called out to the vascular surgeon, Dr. Evie Lacks, who recommends proceeding with a CT angiography. Patient with contrast allergy and says only has a rash reaction. Denies any history of airway compromise or shortness of breath with contrast studies in the past. We'll proceed with hydrocortisone as well as Benadryl prep. Discussed with the family as well as the patient and they are understanding and willing to comply.  ----------------------------------------- 7:55 PM on 09/14/2015 -----------------------------------------  Dr. Evie Lacks is in the emergency department and recommends a heparin bolus because the patient is subtherapeutic on her Coumadin.  ----------------------------------------- 9:02 PM on  09/14/2015 -----------------------------------------  CAT scan angiography with likely subacute versus chronic occlusion. Dr. Evie Lacks recommending admission with aggressive heparin therapy to relieve drainage to therapeutic levels of Coumadin. Signed out to Dr. Judithann Sheen. ____________________________________________   FINAL CLINICAL IMPRESSION(S) / ED DIAGNOSES  Final diagnoses:  Right leg pain   vascular occlusion. sub therapeutic on Coumadin.    Myrna Blazer, MD 09/14/15 2103

## 2015-09-14 NOTE — H&P (Signed)
History and Physical    [name] ZOX:[id number] DOB: 1934/02/27 DOA: 09/14/2015  Referring physician: Dr. Pershing Proud PCP: [name], MD  Specialists: Dr. Lady Gary  Chief Complaint: right leg pain  HPI: [name] is a 82 y.o. female has a past medical history significant for chronic a-fib, previous CVA, DM, ESRD on HD and PVD now with acute worsening pain of RLE. Presented to ER where RLE was tender and cold. INR subtherapeutic. Was seen by Vascular in the ER. CT suggested collateral flow around a presumed popliteal blockage. She is now admitted. No fever. Denies CP or SOB. No N/V/D. Tolerated HD well this AM.  Review of Systems: The patient denies anorexia, fever, weight loss,, vision loss, decreased hearing, hoarseness, chest pain, syncope, dyspnea on exertion, peripheral edema, balance deficits, hemoptysis, abdominal pain, melena, hematochezia, severe indigestion/heartburn, hematuria, incontinence, genital sores, muscle weakness, suspicious skin lesions, transient blindness, difficulty walking, depression, unusual weight change, abnormal bleeding, enlarged lymph nodes, angioedema, and breast masses.   Past Medical History  Diagnosis Date  . Chronic kidney disease   . Dysrhythmia   . Diabetes mellitus without complication (HCC)   . Hypertension   . Stroke (HCC)   . Arthritis     gout  . Dialysis patient (HCC)   . Pleural effusion   . Pulmonary hypertension (HCC)   . Renal insufficiency   . CHF (congestive heart failure) (HCC)   . Peripheral vascular disease (HCC)   . Shortness of breath dyspnea     with exertion  . GERD (gastroesophageal reflux disease)   . Anemia   . Restless leg syndrome   . Atrial fibrillation [facility])    Past Surgical History  Procedure Laterality Date  . Cholecystectomy    . Peripheral vascular catheterization N/A 10/08/2014    Procedure: A/V Shuntogram/Fistulagram;  Surgeon: Annice Needy, MD;  Location: [facility] INVASIVE CV LAB;  Service:  Cardiovascular;  Laterality: N/A;  . Joint replacement      bilateral hip  . Peripheral vascular catheterization N/A 03/04/2015    Procedure: A/V Shuntogram/Fistulagram;  Surgeon: Annice Needy, MD;  Location: [facility] INVASIVE CV LAB;  Service: Cardiovascular;  Laterality: N/A;  . Peripheral vascular catheterization N/A 03/04/2015    Procedure: A/V Shunt Intervention;  Surgeon: Annice Needy, MD;  Location: [facility] INVASIVE CV LAB;  Service: Cardiovascular;  Laterality: N/A;  . Fistulagram ([facility] hx)    . Parathyroidectomy    . Tonsillectomy    . Cardiac catheterization    . Eye surgery    . Cataract extraction, bilateral    . Amputation toe Left 06/07/2015    Procedure: AMPUTATION TOE;  Surgeon: Gwyneth Revels, DPM;  Location: [facility] ORS;  Service: Podiatry;  Laterality: Left;  Marland Kitchen [employer] bladder interstem      currently turned off for MRI in January  . Endarterectomy Left 07/26/2015    Procedure: ENDARTERECTOMY CAROTID;  Surgeon: Renford Dills, MD;  Location: [facility] ORS;  Service: Vascular;  Laterality: Left;  . Thyroidectomy, partial     Social History:  reports that she has never smoked. She has never used smokeless tobacco. She reports that she does not drink alcohol or use illicit drugs.  Allergies  Allergen Reactions  . Codeine Other (See Comments)    Reaction:  Confusion/Hallucinations   . Contrast Media [Iodinated Diagnostic Agents] Other (See Comments)    "skin peel"  . Oxycodone Other (See Comments)    Reaction:  Confusion/Hallucinations   . Quinine Derivatives Rash  Family History  Problem Relation Age of Onset  . Colon cancer Mother   . Stroke Father   . Diabetes Brother     Prior to Admission medications   Medication Sig Start Date End Date Taking? Authorizing Provider  acetaminophen (TYLENOL) 500 MG tablet Take 1,000 mg by mouth 2 (two) times daily.     Historical Provider, MD  albuterol (PROVENTIL HFA;VENTOLIN HFA) 108 (90 Base) MCG/ACT inhaler Inhale 1-2 puffs into the  lungs every 6 (six) hours as needed for wheezing or shortness of breath.    Historical Provider, MD  aspirin 81 MG chewable tablet Chew 1 tablet (81 mg total) by mouth daily. 06/24/15   Katharina Caper, MD  Biotin 1 MG CAPS Take 1 mg by mouth daily.     Historical Provider, MD  bisacodyl (DULCOLAX) 5 MG EC tablet Take 10 mg by mouth daily as needed for moderate constipation.     Historical Provider, MD  calcium acetate, Phos Binder, (PHOSLYRA) 667 MG/5ML SOLN Take 1,334 mg by mouth 3 (three) times daily with meals.    Historical Provider, MD  diltiazem (TIAZAC) 120 MG 24 hr capsule Take 120 mg by mouth daily.    Historical Provider, MD  diphenhydrAMINE (BENADRYL) 25 mg capsule Take 1 capsule (25 mg total) by mouth every 6 (six) hours as needed (rash, itching). 06/24/15   Katharina Caper, MD  docusate sodium (COLACE) 100 MG capsule Take 200 mg by mouth 2 (two) times daily as needed for mild constipation.    Historical Provider, MD  folic acid-vitamin b complex-vitamin c-selenium-zinc (DIALYVITE) 3 MG TABS tablet Take 1 tablet by mouth daily.    Historical Provider, MD  furosemide (LASIX) 80 MG tablet Take 80 mg by mouth 2 (two) times daily.    Historical Provider, MD  gabapentin (NEURONTIN) 100 MG capsule Take 100 mg by mouth at bedtime. Pt takes on Sunday, Monday, Wednesday, and Friday.    Historical Provider, MD  gabapentin (NEURONTIN) 300 MG capsule Take 300 mg by mouth daily. Pt takes on Tuesday, Thursday, and Saturday after dialysis.    Historical Provider, MD  Insulin Detemir (LEVEMIR) 100 UNIT/ML Pen Inject 11 Units into the skin 2 (two) times daily. 0730, 1730    Historical Provider, MD  levofloxacin (LEVAQUIN) 500 MG tablet Take 1 tablet (500 mg total) by mouth every other day. 08/16/15   Srikar Sudini, MD  lidocaine-prilocaine (EMLA) cream Apply 1 application topically as needed (prior to accessing port).     Historical Provider, MD  nystatin (MYCOSTATIN/NYSTOP) 100000 UNIT/GM POWD Apply topically 2  (two) times daily as needed.    Historical Provider, MD  Omega-3 Fatty Acids (FISH OIL CONCENTRATE) 300 MG CAPS Take 1 capsule by mouth daily. 11/04/10   Historical Provider, MD  PARoxetine (PAXIL) 40 MG tablet Take 40 mg by mouth at bedtime.     Historical Provider, MD  pravastatin (PRAVACHOL) 20 MG tablet Take 1 tablet (20 mg total) by mouth daily. 07/27/15   Kimberly A Stegmayer, PA-C  ranitidine (ZANTAC) 150 MG tablet Take 150 mg by mouth 2 (two) times daily.     Historical Provider, MD  senna-docusate (SENOKOT-S) 8.6-50 MG tablet Take 1 tablet by mouth 2 (two) times daily as needed.     Historical Provider, MD  traMADol (ULTRAM) 50 MG tablet Take 1 tablet (50 mg total) by mouth every 6 (six) hours as needed for moderate pain. 07/27/15   Kimberly A Stegmayer, PA-C  warfarin (COUMADIN) 3 MG tablet Take 1.5  mg by mouth daily. Everyday except Mondays    Historical Provider, MD   Physical Exam: Filed Vitals:   09/14/15 1734 09/14/15 1900 09/14/15 2000 09/14/15 2030  BP: 125/83 115/97 147/67 114/96  Pulse: 90 98  112  Temp:      Resp: 16   21  Height:      Weight:      SpO2: 100% 98%  96%     General:  No apparent distress, WDWN, [place]/AT  Eyes: PERRL, EOMI, no scleral icterus, conjunctiva clear  ENT: moist oropharynx without lesions or exudate, TM's benign, dentition fair  Neck: supple, no lymphadenopathy. Bilateral bruits noted. No thyromegaly  Cardiovascular: irregularly irregular without MRG; 1+ peripheral pulses to LLE and trace pedal pulse on right, no JVD, no peripheral edema  Respiratory: CTA biL, good air movement without wheezing, rhonchi or crackled, respiratory effort normal  Abdomen: soft, non tender to palpation, positive bowel sounds, no guarding, no rebound, no organomegaly  Skin: no rashes. Right heel ulcer noted  Musculoskeletal: normal bulk and tone, no joint swelling  Psychiatric: normal mood and affect, A&OX3  Neurologic: CN 2-12 grossly intact, Motor strength  5/5 in all 4 groups. Stocking-glove neuropathy noted. DTR's symmetric  Labs on Admission:  Basic Metabolic Panel:  Recent Labs Lab 09/14/15 1802  NA 130*  K 3.0*  CL 95*  CO2 22  GLUCOSE 216*  BUN 34*  CREATININE 3.10*  CALCIUM 8.0*   Liver Function Tests: No results for input(s): AST, ALT, ALKPHOS, BILITOT, PROT, ALBUMIN in the last 168 hours. No results for input(s): LIPASE, AMYLASE in the last 168 hours. No results for input(s): AMMONIA in the last 168 hours. CBC:  Recent Labs Lab 09/14/15 1802  WBC 9.9  NEUTROABS 7.9*  HGB 10.9*  HCT 32.8*  MCV 93.3  PLT 250   Cardiac Enzymes: No results for input(s): CKTOTAL, CKMB, CKMBINDEX, TROPONINI in the last 168 hours.  BNP (last 3 results) No results for input(s): BNP in the last 8760 hours.  ProBNP (last 3 results) No results for input(s): PROBNP in the last 8760 hours.  CBG: No results for input(s): GLUCAP in the last 168 hours.  Radiological Exams on Admission: Ct Angio Ao+bifem W/cm &/or Wo/cm  09/14/2015  CLINICAL DATA:  Right leg pain. EXAM: CT ANGIOGRAPHY AOBIFEM WITHOUT AND WITH CONTRAST TECHNIQUE: CT angiography of the aorta and lower extremity arteries was performed with reformats generated. CONTRAST:  125 cc Isovue 370 intravenous COMPARISON:  Chest CT 12/12/2014 FINDINGS: Replaced right hepatic artery to the SMA. Accessory right renal artery. Otherwise, aortic branching is standard. There is diffuse atherosclerotic calcification of the aorta and visceral branches. No major branch occlusion is seen. Negative for aneurysm. No inflow disease to explain leg symptoms. Right leg: Atheromatous but widely patent right common femoral and superficial femoral arteries. Occluded popliteal artery at the level of the knee with well-developed collaterals providing prompt and symmetric reconstituted collateral flow. On reformats, this appears to be an eccentric luminal lesion with defined margins and internal calcification,  possible remote embolus. Symmetrically timed runoff that is 3 vessel at the ankle. Left leg: Diffusely atheromatous but widely patent left common femoral and superficial femoral arteries. The left profunda femoris is widely patent. Mild to moderate popliteal narrowing on the left. Three-vessel runoff at the ankle. Lower chest and abdominal wall: Cardiomegaly. Septal thickening at the bases with trace effusions, loculated complex appearing on the left with subpleural scarring or atelectasis. This is likely developing round atelectasis given 2016  chest CT appearance 8 mm nodule over the right diaphragm is stable since comparison CT. Hepatobiliary: No focal liver abnormality.No evidence of biliary obstruction or stone. Pancreas: Unremarkable. Spleen: Sub cm low-density in the medial spleen is nonspecific. Adrenals/Urinary Tract: Negative adrenals. Marked lack of enhancement of the kidneys in this patient with end-stage renal disease. 14 mm right upper pole calculus. No hydronephrosis or ureteral calculus. Bladder is obscured by streak artifact from hip prostheses. Reproductive:Negative Stomach/Bowel:  No obstruction. No appendicitis. Vascular/Lymphatic: No acute vascular abnormality. No mass or adenopathy. Peritoneal: No ascites or pneumoperitoneum. Musculoskeletal: L2-3 endplate erosions, sclerosis, and surrounding paraspinal inflammation. No indication of epidural collection or high-grade stenosis. There is mineralization at the level of the disc. Marked osteopenia.  Baker cyst on the left. Review of the MIP images confirms the above findings. IMPRESSION: 1. Occluded or critically stenotic right popliteal artery, chronic appearing given the degree of collateral formation with symmetric three-vessel runoff. The stenotic lesion has an eccentric luminal based appearance and may have been embolic. 2. Negative for inflow disease. 3. L2-3 discitis versus dialysis related spondyloarthropathy, associated disc calcification  favoring the latter. 4. Possible mild pulmonary edema. 5. Chronic small left pleural effusion and scarring. 6. 8 mm right lower lobe pulmonary nodule but is stable from 12/12/2014. 1 year follow-up CT could document stability (assuming patient is a treatment candidate). Electronically Signed   By: Marnee Spring M.D.   On: 09/14/2015 20:36   US Venous Img Lower Unilateral Right  09/14/2015  CLINICAL DATA:  82 year old female with a history of leg pain EXAM: RIGHT LOWER EXTREMITY VENOUS DOPPLER ULTRASOUND TECHNIQUE: Gray-scale sonography with graded compression, as well as color Doppler and duplex ultrasound were performed to evaluate the lower extremity deep venous systems from the level of the common femoral vein and including the common femoral, femoral, profunda femoral, popliteal and calf veins including the posterior tibial, peroneal and gastrocnemius veins when visible. The superficial great saphenous vein was also interrogated. Spectral Doppler was utilized to evaluate flow at rest and with distal augmentation maneuvers in the common femoral, femoral and popliteal veins. COMPARISON:  None. FINDINGS: Contralateral Common Femoral Vein: Respiratory phasicity is normal and symmetric with the symptomatic side. No evidence of thrombus. Normal compressibility. Common Femoral Vein: No evidence of thrombus. Normal compressibility, respiratory phasicity and response to augmentation. Saphenofemoral Junction: No evidence of thrombus. Normal compressibility and flow on color Doppler imaging. Profunda Femoral Vein: No evidence of thrombus. Normal compressibility and flow on color Doppler imaging. Femoral Vein: No evidence of thrombus. Normal compressibility, respiratory phasicity and response to augmentation. Popliteal Vein: No evidence of thrombus. Normal compressibility, respiratory phasicity and response to augmentation. Calf Veins: No evidence of thrombus. Normal compressibility and flow on color Doppler imaging.  Superficial Great Saphenous Vein: No evidence of thrombus. Normal compressibility and flow on color Doppler imaging. Other Findings:  None. IMPRESSION: Sonographic survey of the right lower extremity negative for DVT. Signed, Yvone Neu. Loreta Ave, DO [facility] Electronically Signed   By: Gilmer Mor D.O.   On: 09/14/2015 15:07    EKG: Independently reviewed.  Assessment/Plan Principal Problem:   PVD (peripheral vascular disease) with claudication (HCC) Active Problems:   End stage renal disease (HCC)   Chronic atrial fibrillation (HCC)   Leg pain, right   Will admit to floor on Heparin drip and increase baseline coumadin until INR 2.5-3.0. Consult Vascular, Nephrology and the Wound Team. Hold off on ABX for now. Repeat labs in AM.  Follow sugars. IV pain meds as needed.  Diet: carb modified Fluids: NS with K+@100  DVT Prophylaxis: IV Heparin  Code Status: FULL  Family Communication: yes  Disposition Plan: home  Time spent: 55 min

## 2015-09-15 LAB — COMPREHENSIVE METABOLIC PANEL
ALT: 20 U/L (ref 14–54)
ANION GAP: 13 (ref 5–15)
AST: 50 U/L — AB (ref 15–41)
Albumin: 2.9 g/dL — ABNORMAL LOW (ref 3.5–5.0)
Alkaline Phosphatase: 64 U/L (ref 38–126)
BILIRUBIN TOTAL: 0.5 mg/dL (ref 0.3–1.2)
BUN: 42 mg/dL — ABNORMAL HIGH (ref 6–20)
CHLORIDE: 97 mmol/L — AB (ref 101–111)
CO2: 19 mmol/L — ABNORMAL LOW (ref 22–32)
Calcium: 7.7 mg/dL — ABNORMAL LOW (ref 8.9–10.3)
Creatinine, Ser: 3.75 mg/dL — ABNORMAL HIGH (ref 0.44–1.00)
GFR, EST AFRICAN AMERICAN: 12 mL/min — AB (ref >60)
GFR, EST NON AFRICAN AMERICAN: 11 mL/min — AB (ref >60)
Glucose, Bld: 242 mg/dL — ABNORMAL HIGH (ref 65–99)
POTASSIUM: 4.3 mmol/L (ref 3.5–5.1)
Sodium: 129 mmol/L — ABNORMAL LOW (ref 135–145)
TOTAL PROTEIN: 7.3 g/dL (ref 6.5–8.1)

## 2015-09-15 LAB — HEPARIN LEVEL (UNFRACTIONATED)
HEPARIN UNFRACTIONATED: 0.37 [IU]/mL (ref 0.30–0.70)
Heparin Unfractionated: 0.17 IU/mL — ABNORMAL LOW (ref 0.30–0.70)

## 2015-09-15 LAB — CBC
HCT: 31.2 % — ABNORMAL LOW (ref 35.0–47.0)
Hemoglobin: 10.4 g/dL — ABNORMAL LOW (ref 12.0–16.0)
MCH: 31.2 pg (ref 26.0–34.0)
MCHC: 33.2 g/dL (ref 32.0–36.0)
MCV: 93.9 fL (ref 80.0–100.0)
PLATELETS: 243 10*3/uL (ref 150–440)
RBC: 3.33 MIL/uL — AB (ref 3.80–5.20)
RDW: 17.1 % — ABNORMAL HIGH (ref 11.5–14.5)
WBC: 12.6 10*3/uL — AB (ref 3.6–11.0)

## 2015-09-15 LAB — TYPE AND SCREEN
ABO/RH(D): O POS
ANTIBODY SCREEN: NEGATIVE

## 2015-09-15 LAB — GLUCOSE, CAPILLARY
GLUCOSE-CAPILLARY: 233 mg/dL — AB (ref 65–99)
Glucose-Capillary: 170 mg/dL — ABNORMAL HIGH (ref 65–99)
Glucose-Capillary: 135 mg/dL — ABNORMAL HIGH (ref 65–99)
Glucose-Capillary: 82 mg/dL (ref 65–99)

## 2015-09-15 LAB — PROTIME-INR
INR: 1.81
Prothrombin Time: 20.9 seconds — ABNORMAL HIGH (ref 11.4–15.0)

## 2015-09-15 MED ORDER — HEPARIN BOLUS VIA INFUSION
2100.0000 [IU] | Freq: Once | INTRAVENOUS | Status: AC
  Administered 2015-09-15: 2100 [IU] via INTRAVENOUS
  Filled 2015-09-15: qty 2100

## 2015-09-15 NOTE — Progress Notes (Signed)
Subjective:  Patient known to our practice from outpatient. She is followed for end-stage renal disease and dialyzes at [facility] unit Tuesday/Thursday/Saturday. Her last dialysis was on Saturday. She could only stay 2 hours because of pain in her leg. She presented with pain and discoloration in the right lower extremity. She has some nonhealing ulcers over her heel as well as toes. Appetite is okay. No shortness of breath. No leg edema.   Objective:  Vital signs in last 24 hours:  Temp:  [97.6 F (36.4 C)-98 F (36.7 C)] 97.9 F (36.6 C) (04/16 0524) Pulse Rate:  [70-112] 70 (04/16 0524) Resp:  [16-22] 16 (04/16 0524) BP: (114-152)/(51-97) 137/58 mmHg (04/16 0524) SpO2:  [93 %-100 %] 93 % (04/16 0524) Weight:  [72.576 kg (160 lb)-74.662 kg (164 lb 9.6 oz)] 74.662 kg (164 lb 9.6 oz) (04/16 0400)  Weight change:  Filed Weights   09/14/15 1352 09/14/15 2251 09/15/15 0400  Weight: 72.576 kg (160 lb) 72.757 kg (160 lb 6.4 oz) 74.662 kg (164 lb 9.6 oz)    Intake/Output:    Intake/Output Summary (Last 24 hours) at 09/15/15 1237 Last data filed at 09/15/15 4132  Gross per 24 hour  Intake 866.15 ml  Output      0 ml  Net 866.15 ml     Physical Exam: General: No acute distress, laying in the bed  HEENT Anicteric, moist oral mucous membranes  Neck supple  Pulm/lungs Mild scattered rhonchi, otherwise clear  CVS/Heart Irregular, atrial fibrillation  Abdomen:  Soft, nontender, nondistended  Extremities: Nonhealing ulcers over heels and toes, right leg, cyanotic discoloration  Neurologic: Alert, oriented  Skin: No rashes, cyanotic discoloration of right foot  Access: Right upper arm AV fistula       Basic Metabolic Panel:   Recent Labs Lab 09/14/15 1802 09/15/15 0549  NA 130* 129*  K 3.0* 4.3  CL 95* 97*  CO2 22 19*  GLUCOSE 216* 242*  BUN 34* 42*  CREATININE 3.10* 3.75*  CALCIUM 8.0* 7.7*     CBC:  Recent Labs Lab 09/14/15 1802 09/15/15 0549   WBC 9.9 12.6*  NEUTROABS 7.9*  --   HGB 10.9* 10.4*  HCT 32.8* 31.2*  MCV 93.3 93.9  PLT 250 243      Microbiology:  No results found for this or any previous visit (from the past 720 hour(s)).  Coagulation Studies:  Recent Labs  09/14/15 1802 09/15/15 0549  LABPROT 21.6* 20.9*  INR 1.89 1.81    Urinalysis: No results for input(s): COLORURINE, LABSPEC, PHURINE, GLUCOSEU, HGBUR, BILIRUBINUR, KETONESUR, PROTEINUR, UROBILINOGEN, NITRITE, LEUKOCYTESUR in the last 72 hours.  Invalid input(s): APPERANCEUR    Imaging: Ct Angio Ao+bifem W/cm &/or Wo/cm  09/14/2015  CLINICAL DATA:  Right leg pain. EXAM: CT ANGIOGRAPHY AOBIFEM WITHOUT AND WITH CONTRAST TECHNIQUE: CT angiography of the aorta and lower extremity arteries was performed with reformats generated. CONTRAST:  125 cc Isovue 370 intravenous COMPARISON:  Chest CT 12/12/2014 FINDINGS: Replaced right hepatic artery to the SMA. Accessory right renal artery. Otherwise, aortic branching is standard. There is diffuse atherosclerotic calcification of the aorta and visceral branches. No major branch occlusion is seen. Negative for aneurysm. No inflow disease to explain leg symptoms. Right leg: Atheromatous but widely patent right common femoral and superficial femoral arteries. Occluded popliteal artery at the level of the knee with well-developed collaterals providing prompt and symmetric reconstituted collateral flow. On reformats, this appears to be an eccentric luminal lesion with defined margins and internal calcification,  possible remote embolus. Symmetrically timed runoff that is 3 vessel at the ankle. Left leg: Diffusely atheromatous but widely patent left common femoral and superficial femoral arteries. The left profunda femoris is widely patent. Mild to moderate popliteal narrowing on the left. Three-vessel runoff at the ankle. Lower chest and abdominal wall: Cardiomegaly. Septal thickening at the bases with trace effusions,  loculated complex appearing on the left with subpleural scarring or atelectasis. This is likely developing round atelectasis given 2016 chest CT appearance 8 mm nodule over the right diaphragm is stable since comparison CT. Hepatobiliary: No focal liver abnormality.No evidence of biliary obstruction or stone. Pancreas: Unremarkable. Spleen: Sub cm low-density in the medial spleen is nonspecific. Adrenals/Urinary Tract: Negative adrenals. Marked lack of enhancement of the kidneys in this patient with end-stage renal disease. 14 mm right upper pole calculus. No hydronephrosis or ureteral calculus. Bladder is obscured by streak artifact from hip prostheses. Reproductive:Negative Stomach/Bowel:  No obstruction. No appendicitis. Vascular/Lymphatic: No acute vascular abnormality. No mass or adenopathy. Peritoneal: No ascites or pneumoperitoneum. Musculoskeletal: L2-3 endplate erosions, sclerosis, and surrounding paraspinal inflammation. No indication of epidural collection or high-grade stenosis. There is mineralization at the level of the disc. Marked osteopenia.  Baker cyst on the left. Review of the MIP images confirms the above findings. IMPRESSION: 1. Occluded or critically stenotic right popliteal artery, chronic appearing given the degree of collateral formation with symmetric three-vessel runoff. The stenotic lesion has an eccentric luminal based appearance and may have been embolic. 2. Negative for inflow disease. 3. L2-3 discitis versus dialysis related spondyloarthropathy, associated disc calcification favoring the latter. 4. Possible mild pulmonary edema. 5. Chronic small left pleural effusion and scarring. 6. 8 mm right lower lobe pulmonary nodule but is stable from 12/12/2014. 1 year follow-up CT could document stability (assuming patient is a treatment candidate). Electronically Signed   By: Marnee SpringJonathon  Watts M.D.   On: 09/14/2015 20:36   [place] Venous Img Lower Unilateral Right  09/14/2015  CLINICAL DATA:   82 year old female with a history of leg pain EXAM: RIGHT LOWER EXTREMITY VENOUS DOPPLER ULTRASOUND TECHNIQUE: Gray-scale sonography with graded compression, as well as color Doppler and duplex ultrasound were performed to evaluate the lower extremity deep venous systems from the level of the common femoral vein and including the common femoral, femoral, profunda femoral, popliteal and calf veins including the posterior tibial, peroneal and gastrocnemius veins when visible. The superficial great saphenous vein was also interrogated. Spectral Doppler was utilized to evaluate flow at rest and with distal augmentation maneuvers in the common femoral, femoral and popliteal veins. COMPARISON:  None. FINDINGS: Contralateral Common Femoral Vein: Respiratory phasicity is normal and symmetric with the symptomatic side. No evidence of thrombus. Normal compressibility. Common Femoral Vein: No evidence of thrombus. Normal compressibility, respiratory phasicity and response to augmentation. Saphenofemoral Junction: No evidence of thrombus. Normal compressibility and flow on color Doppler imaging. Profunda Femoral Vein: No evidence of thrombus. Normal compressibility and flow on color Doppler imaging. Femoral Vein: No evidence of thrombus. Normal compressibility, respiratory phasicity and response to augmentation. Popliteal Vein: No evidence of thrombus. Normal compressibility, respiratory phasicity and response to augmentation. Calf Veins: No evidence of thrombus. Normal compressibility and flow on color Doppler imaging. Superficial Great Saphenous Vein: No evidence of thrombus. Normal compressibility and flow on color Doppler imaging. Other Findings:  None. IMPRESSION: Sonographic survey of the right lower extremity negative for DVT. Signed, Yvone NeuJaime S. Loreta AveWagner, DO [facility] Electronically Signed   By: Marijean NiemannJaime  Loreta Ave D.O.   On: 09/14/2015 15:07     Medications:   .  0.9 % NaCl with KCl 20 mEq / L 100 mL/hr at 09/15/15 0613  . heparin 1,300 Units/hr (09/15/15 0755)   . aspirin  81 mg Oral Daily  . b complex-vitamin c-folic acid  1 tablet Oral Daily  . calcium acetate (Phos Binder)  1,334 mg Oral TID WC  . diltiazem  120 mg Oral Daily  . famotidine (PEPCID) IV  20 mg Intravenous Daily  . furosemide  80 mg Oral BID  . gabapentin  100 mg Oral Once per day on Sun Mon Wed Fri  . [START ON 09/17/2015] gabapentin  300 mg Oral Once per day on Tue Thu Sat  . insulin aspart  0-9 Units Subcutaneous TID WC  . insulin detemir  11 Units Subcutaneous BID  . PARoxetine  40 mg Oral QHS  . pravastatin  20 mg Oral Daily  . sodium chloride flush  3 mL Intravenous Q12H  . warfarin  3 mg Oral q1800  . Warfarin - Physician Dosing Inpatient   Does not apply q1800   acetaminophen **OR** acetaminophen, albuterol, bisacodyl, diphenhydrAMINE, docusate sodium, morphine injection, ondansetron **OR** ondansetron (ZOFRAN) IV, senna-docusate, traMADol  Assessment/ Plan:  82 y.o. female with past medical history of atrial fibrillation on Coumadin, hypertension, gout, insulin-dependent diabetes mellitus type 2, diabetic neuropathy, CVA with right visual field blindness, hyperlipidemia, major depressive disorder, overactive bladder with incontinence, generalized anxiety disorder, congestive heart failure diastolic, parathyroidectomy, and osteoarthritis, s/p left CEA 07/26/15. Patient has never smoked.  [facility] TTS [employer].   1. ESRD on HD TTHS:  - patient's last dialysis treatment was on Saturday. - Her electrolytes and volume status are acceptable. No acute indication for dialysis - Outpatient dialysis ED W 72 kg. Today's weight 74 kg. - we'll plan for dialysis on Tuesday - agree with maintenance oral Lasix - Discontinue IV fluids as patient is not hypovolemic.  2. Anemia chronic kidney disease. hgb 10.4 - low dose EPO with HD  3. Secondary hyperparathyroidism.  -  Monitor phos during hospital stay - continue PhosLo  4. Right lower extremity ischemia - Vascular intervention planned for Monday  5. Atrial fibrillation - Cardiology evaluation in progress. - heparin drip, Cardizem,    LOS: 1 Leontyne Manville 4/16/201712:37 PM

## 2015-09-15 NOTE — Consult Note (Signed)
[facility] Cardiology Consultation Note  Patient ID: MESHELL ABDULAZIZ, MRN: [id number], DOB/AGE: 1933/12/21 82 y.o. Admit date: 09/14/2015   Date of Consult: 09/15/2015 Primary Physician: [name], MD Primary Cardiologist:Unknown  Chief Complaint:  Chief Complaint  Patient presents with  . Leg Injury   Reason for Consult: peripheral vascular disease with chronic atrial fibrillation and right lower extremity pain  HPI: 82 y.o. female with known chronic atrial fibrillation with controlled ventricular rate previous cerebrovascular accident diabetes with complications essential hypertension with chronic kidney disease stage V with right lower extremity pain consistent with the severe exacerbation of vascular disease currently without evidence of angina and/or congestive heart failure on appropriate medication management. The patient currently is on diltiazem for appropriate heart rate control of atrial fibrillation stable at this time as well as previous anticoagulation with warfarin but INR was low and therefore thrombosis has occurred and right lower extremity needing further vascular intervention. The patient also has had stable statin therapy for high intensity cholesterol therapy for treatment of hyperlipidemia and cardiovascular risk.  Past Medical History  Diagnosis Date  . Chronic kidney disease   . Dysrhythmia   . Diabetes mellitus without complication (HCC)   . Hypertension   . Stroke (HCC)   . Arthritis     gout  . Dialysis patient (HCC)   . Pleural effusion   . Pulmonary hypertension (HCC)   . Renal insufficiency   . CHF (congestive heart failure) (HCC)   . Peripheral vascular disease (HCC)   . Shortness of breath dyspnea     with exertion  . GERD (gastroesophageal reflux disease)   . Anemia   . Restless leg syndrome   . Atrial fibrillation [facility])       Surgical History:  Past Surgical History  Procedure Laterality Date  . Cholecystectomy    . Peripheral  vascular catheterization N/A 10/08/2014    Procedure: A/V Shuntogram/Fistulagram;  Surgeon: Annice Needy, MD;  Location: [facility] INVASIVE CV LAB;  Service: Cardiovascular;  Laterality: N/A;  . Joint replacement      bilateral hip  . Peripheral vascular catheterization N/A 03/04/2015    Procedure: A/V Shuntogram/Fistulagram;  Surgeon: Annice Needy, MD;  Location: [facility] INVASIVE CV LAB;  Service: Cardiovascular;  Laterality: N/A;  . Peripheral vascular catheterization N/A 03/04/2015    Procedure: A/V Shunt Intervention;  Surgeon: Annice Needy, MD;  Location: [facility] INVASIVE CV LAB;  Service: Cardiovascular;  Laterality: N/A;  . Fistulagram ([facility] hx)    . Parathyroidectomy    . Tonsillectomy    . Cardiac catheterization    . Eye surgery    . Cataract extraction, bilateral    . Amputation toe Left 06/07/2015    Procedure: AMPUTATION TOE;  Surgeon: Gwyneth Revels, DPM;  Location: [facility] ORS;  Service: Podiatry;  Laterality: Left;  Marland Kitchen Med[employer] bladder interstem      currently turned off for MRI in January  . Endarterectomy Left 07/26/2015    Procedure: ENDARTERECTOMY CAROTID;  Surgeon: Renford Dills, MD;  Location: [facility] ORS;  Service: Vascular;  Laterality: Left;  . Thyroidectomy, partial       Home Meds: Prior to Admission medications   Medication Sig Start Date End Date Taking? Authorizing Provider  acetaminophen (TYLENOL) 500 MG tablet Take 1,000 mg by mouth 2 (two) times daily.     Historical Provider, MD  albuterol (PROVENTIL HFA;VENTOLIN HFA) 108 (90 Base) MCG/ACT inhaler Inhale 1-2 puffs into the lungs every 6 (six) hours as needed for  wheezing or shortness of breath.    Historical Provider, MD  aspirin 81 MG chewable tablet Chew 1 tablet (81 mg total) by mouth daily. 06/24/15   Katharina Caper, MD  Biotin 1 MG CAPS Take 1 mg by mouth daily.     Historical Provider, MD  bisacodyl (DULCOLAX) 5 MG EC tablet Take 10 mg by mouth daily as needed for moderate constipation.     Historical Provider, MD   calcium acetate, Phos Binder, (PHOSLYRA) 667 MG/5ML SOLN Take 1,334 mg by mouth 3 (three) times daily with meals.    Historical Provider, MD  diltiazem (TIAZAC) 120 MG 24 hr capsule Take 120 mg by mouth daily.    Historical Provider, MD  diphenhydrAMINE (BENADRYL) 25 mg capsule Take 1 capsule (25 mg total) by mouth every 6 (six) hours as needed (rash, itching). 06/24/15   Katharina Caper, MD  docusate sodium (COLACE) 100 MG capsule Take 200 mg by mouth 2 (two) times daily as needed for mild constipation.    Historical Provider, MD  folic acid-vitamin b complex-vitamin c-selenium-zinc (DIALYVITE) 3 MG TABS tablet Take 1 tablet by mouth daily.    Historical Provider, MD  furosemide (LASIX) 80 MG tablet Take 80 mg by mouth 2 (two) times daily.    Historical Provider, MD  gabapentin (NEURONTIN) 100 MG capsule Take 100 mg by mouth at bedtime. Pt takes on Sunday, Monday, Wednesday, and Friday.    Historical Provider, MD  gabapentin (NEURONTIN) 300 MG capsule Take 300 mg by mouth daily. Pt takes on Tuesday, Thursday, and Saturday after dialysis.    Historical Provider, MD  Insulin Detemir (LEVEMIR) 100 UNIT/ML Pen Inject 11 Units into the skin 2 (two) times daily. 0730, 1730    Historical Provider, MD  levofloxacin (LEVAQUIN) 500 MG tablet Take 1 tablet (500 mg total) by mouth every other day. 08/16/15   Srikar Sudini, MD  lidocaine-prilocaine (EMLA) cream Apply 1 application topically as needed (prior to accessing port).     Historical Provider, MD  nystatin (MYCOSTATIN/NYSTOP) 100000 UNIT/GM POWD Apply topically 2 (two) times daily as needed.    Historical Provider, MD  Omega-3 Fatty Acids (FISH OIL CONCENTRATE) 300 MG CAPS Take 1 capsule by mouth daily. 11/04/10   Historical Provider, MD  PARoxetine (PAXIL) 40 MG tablet Take 40 mg by mouth at bedtime.     Historical Provider, MD  pravastatin (PRAVACHOL) 20 MG tablet Take 1 tablet (20 mg total) by mouth daily. 07/27/15   Kimberly A Stegmayer, PA-C  ranitidine  (ZANTAC) 150 MG tablet Take 150 mg by mouth 2 (two) times daily.     Historical Provider, MD  senna-docusate (SENOKOT-S) 8.6-50 MG tablet Take 1 tablet by mouth 2 (two) times daily as needed.     Historical Provider, MD  traMADol (ULTRAM) 50 MG tablet Take 1 tablet (50 mg total) by mouth every 6 (six) hours as needed for moderate pain. 07/27/15   Kimberly A Stegmayer, PA-C  warfarin (COUMADIN) 3 MG tablet Take 1.5 mg by mouth daily. Everyday except Mondays    Historical Provider, MD    Inpatient Medications:  . aspirin  81 mg Oral Daily  . b complex-vitamin c-folic acid  1 tablet Oral Daily  . calcium acetate (Phos Binder)  1,334 mg Oral TID WC  . diltiazem  120 mg Oral Daily  . famotidine (PEPCID) IV  20 mg Intravenous Daily  . furosemide  80 mg Oral BID  . gabapentin  100 mg Oral Once per day on Sun  Mon Wed Fri  . [START ON 09/17/2015] gabapentin  300 mg Oral Once per day on Tue Thu Sat  . insulin aspart  0-9 Units Subcutaneous TID WC  . insulin detemir  11 Units Subcutaneous BID  . PARoxetine  40 mg Oral QHS  . pravastatin  20 mg Oral Daily  . sodium chloride flush  3 mL Intravenous Q12H  . warfarin  3 mg Oral q1800  . Warfarin - Physician Dosing Inpatient   Does not apply q1800   . 0.9 % NaCl with KCl 20 mEq / L 100 mL/hr at 09/15/15 0613  . heparin 1,300 Units/hr (09/15/15 0755)    Allergies:  Allergies  Allergen Reactions  . Codeine Other (See Comments)    Reaction:  Confusion/Hallucinations   . Contrast Media [Iodinated Diagnostic Agents] Other (See Comments)    "skin peel"  . Oxycodone Other (See Comments)    Reaction:  Confusion/Hallucinations   . Quinine Derivatives Rash    Social History   Social History  . Marital Status: Widowed    Spouse Name: N/A  . Number of Children: N/A  . Years of Education: N/A   Occupational History  . Not on file.   Social History Main Topics  . Smoking status: Never Smoker   . Smokeless tobacco: Never Used  . Alcohol Use: No   . Drug Use: No  . Sexual Activity: No   Other Topics Concern  . Not on file   Social History Narrative     Family History  Problem Relation Age of Onset  . Colon cancer Mother   . Stroke Father   . Diabetes Brother      Review of Systems Positive forRight lower extremity pain and swelling Negative for: General:  chills, fever, night sweats or weight changes.  Cardiovascular: PND orthopnea syncope dizziness  Dermatological skin lesions rashes Respiratory: Cough congestion Urologic: Frequent urination urination at night and hematuria Abdominal: negative for nausea, vomiting, diarrhea, bright red blood per rectum, melena, or hematemesis Neurologic: negative for visual changes, and/or hearing changes  All other systems reviewed and are otherwise negative except as noted above.  Labs: No results for input(s): CKTOTAL, CKMB, TROPONINI in the last 72 hours. Lab Results  Component Value Date   WBC 12.6* 09/15/2015   HGB 10.4* 09/15/2015   HCT 31.2* 09/15/2015   MCV 93.9 09/15/2015   PLT 243 09/15/2015    Recent Labs Lab 09/15/15 0549  NA 129*  K 4.3  CL 97*  CO2 19*  BUN 42*  CREATININE 3.75*  CALCIUM 7.7*  PROT 7.3  BILITOT 0.5  ALKPHOS 64  ALT 20  AST 50*  GLUCOSE 242*   Lab Results  Component Value Date   CHOL 125 06/19/2015   HDL 35* 06/19/2015   LDLCALC 61 06/19/2015   TRIG 144 06/19/2015   No results found for: DDIMER  Radiology/Studies:  Ct Angio Ao+bifem W/cm &/or Wo/cm  09/14/2015  CLINICAL DATA:  Right leg pain. EXAM: CT ANGIOGRAPHY AOBIFEM WITHOUT AND WITH CONTRAST TECHNIQUE: CT angiography of the aorta and lower extremity arteries was performed with reformats generated. CONTRAST:  125 cc Isovue 370 intravenous COMPARISON:  Chest CT 12/12/2014 FINDINGS: Replaced right hepatic artery to the SMA. Accessory right renal artery. Otherwise, aortic branching is standard. There is diffuse atherosclerotic calcification of the aorta and visceral branches.  No major branch occlusion is seen. Negative for aneurysm. No inflow disease to explain leg symptoms. Right leg: Atheromatous but widely patent right common femoral  and superficial femoral arteries. Occluded popliteal artery at the level of the knee with well-developed collaterals providing prompt and symmetric reconstituted collateral flow. On reformats, this appears to be an eccentric luminal lesion with defined margins and internal calcification, possible remote embolus. Symmetrically timed runoff that is 3 vessel at the ankle. Left leg: Diffusely atheromatous but widely patent left common femoral and superficial femoral arteries. The left profunda femoris is widely patent. Mild to moderate popliteal narrowing on the left. Three-vessel runoff at the ankle. Lower chest and abdominal wall: Cardiomegaly. Septal thickening at the bases with trace effusions, loculated complex appearing on the left with subpleural scarring or atelectasis. This is likely developing round atelectasis given 2016 chest CT appearance 8 mm nodule over the right diaphragm is stable since comparison CT. Hepatobiliary: No focal liver abnormality.No evidence of biliary obstruction or stone. Pancreas: Unremarkable. Spleen: Sub cm low-density in the medial spleen is nonspecific. Adrenals/Urinary Tract: Negative adrenals. Marked lack of enhancement of the kidneys in this patient with end-stage renal disease. 14 mm right upper pole calculus. No hydronephrosis or ureteral calculus. Bladder is obscured by streak artifact from hip prostheses. Reproductive:Negative Stomach/Bowel:  No obstruction. No appendicitis. Vascular/Lymphatic: No acute vascular abnormality. No mass or adenopathy. Peritoneal: No ascites or pneumoperitoneum. Musculoskeletal: L2-3 endplate erosions, sclerosis, and surrounding paraspinal inflammation. No indication of epidural collection or high-grade stenosis. There is mineralization at the level of the disc. Marked osteopenia.  Baker  cyst on the left. Review of the MIP images confirms the above findings. IMPRESSION: 1. Occluded or critically stenotic right popliteal artery, chronic appearing given the degree of collateral formation with symmetric three-vessel runoff. The stenotic lesion has an eccentric luminal based appearance and may have been embolic. 2. Negative for inflow disease. 3. L2-3 discitis versus dialysis related spondyloarthropathy, associated disc calcification favoring the latter. 4. Possible mild pulmonary edema. 5. Chronic small left pleural effusion and scarring. 6. 8 mm right lower lobe pulmonary nodule but is stable from 12/12/2014. 1 year follow-up CT could document stability (assuming patient is a treatment candidate). Electronically Signed   By: Marnee SpringJonathon  Watts M.D.   On: 09/14/2015 20:36   [place] Venous Img Lower Unilateral Right  09/14/2015  CLINICAL DATA:  82 year old female with a history of leg pain EXAM: RIGHT LOWER EXTREMITY VENOUS DOPPLER ULTRASOUND TECHNIQUE: Gray-scale sonography with graded compression, as well as color Doppler and duplex ultrasound were performed to evaluate the lower extremity deep venous systems from the level of the common femoral vein and including the common femoral, femoral, profunda femoral, popliteal and calf veins including the posterior tibial, peroneal and gastrocnemius veins when visible. The superficial great saphenous vein was also interrogated. Spectral Doppler was utilized to evaluate flow at rest and with distal augmentation maneuvers in the common femoral, femoral and popliteal veins. COMPARISON:  None. FINDINGS: Contralateral Common Femoral Vein: Respiratory phasicity is normal and symmetric with the symptomatic side. No evidence of thrombus. Normal compressibility. Common Femoral Vein: No evidence of thrombus. Normal compressibility, respiratory phasicity and response to augmentation. Saphenofemoral Junction: No evidence of thrombus. Normal compressibility and flow on color  Doppler imaging. Profunda Femoral Vein: No evidence of thrombus. Normal compressibility and flow on color Doppler imaging. Femoral Vein: No evidence of thrombus. Normal compressibility, respiratory phasicity and response to augmentation. Popliteal Vein: No evidence of thrombus. Normal compressibility, respiratory phasicity and response to augmentation. Calf Veins: No evidence of thrombus. Normal compressibility and flow on color Doppler imaging. Superficial Great Saphenous Vein: No evidence of thrombus. Normal compressibility  and flow on color Doppler imaging. Other Findings:  None. IMPRESSION: Sonographic survey of the right lower extremity negative for DVT. Signed, Yvone Neu. Loreta Ave, DO [facility] Radiology Electronically Signed   By: Gilmer Mor D.O.   On: 09/14/2015 15:07    EKG: Atrial fibrillation  Weights: Filed Weights   09/14/15 1352 09/14/15 2251 09/15/15 0400  Weight: 160 lb (72.576 kg) 160 lb 6.4 oz (72.757 kg) 164 lb 9.6 oz (74.662 kg)     Physical Exam: Blood pressure 137/58, pulse 70, temperature 97.9 F (36.6 C), temperature source Oral, resp. rate 16, height  (1.651 m), weight 164 lb 9.6 oz (74.662 kg), SpO2 93 %. Body mass index is 27.39 kg/(m^2). General: Well developed, well nourished, in no acute distress. Head eyes ears nose throat: Normocephalic, atraumatic, sclera non-icteric, no xanthomas, nares are without discharge. No apparent thyromegaly and/or mass  Lungs: Normal respiratory effort. Few wheezes, no rales, no rhonchi.  Heart: Irregular with normal S1 S2. no murmur gallop, no rub, PMI is normal size and placement, carotid upstroke normal without bruit, jugular venous pressure is normal Abdomen: Soft, non-tender, non-distended with normoactive bowel sounds. No hepatomegaly. No rebound/guarding. No obvious abdominal masses. Abdominal aorta is normal size without bruit Extremities: Positive edema. Positive cyanosis,  no clubbing, positive ulcers  Peripheral : 2+ bilateral upper extremity pulses, 0 + bilateral femoral pulses, 2+ bilateral dorsal pedal pulse Neuro: Alert and oriented. No facial asymmetry. No focal deficit. Moves all extremities spontaneously. Musculoskeletal: Normal muscle tone without kyphosis Psych:  Responds to questions appropriately with a normal affect.    Assessment: 82 year old female with chronic nonvalvular atrial fibrillation with previous stroke diabetes chronic kidney disease stage V essential hypertension with peripheral vascular disease complication and ischemic right limb needing further intervention currently at lowest risk possible for cardiovascular complication with surgery.  Plan: 1. Discontinuation of warfarin for further risk reduction of bleeding complications with surgical intervention 2. Heparin for further risk reduction of thrombosis and further limb ischemia 3. Diltiazem for heart rate control of atrial fibrillation 4. No further cardiac intervention at this time due to no evidence of myocardial infarction and/or angina and/or heart failure 5. No restrictions to rehabilitation X 6. Pravastatin for high intensity cholesterol therapy 7. Further consideration of breath reinstatement of warfarin   Signed, Lamar Blinks M.D. [facility] Cardiology 09/15/2015, 12:24 PM

## 2015-09-15 NOTE — Progress Notes (Signed)
Neuro assessment was completed at this time, pt alert times 4. Pt's right pupil is a size 1 and is sluggish to respond to light, left pupil is a size 2 and is brisk to respond to light. Pt has a history of having two strokes in the past, most recent stroke was in January 2017. Will continue to monitor pt.   [name]

## 2015-09-15 NOTE — Progress Notes (Signed)
PT Cancellation Note  Patient Details Name: [name] MRN: [id number] DOB: 1934-04-15   Cancelled Treatment:    Reason Eval/Treat Not Completed: Patient declined, no reason specified Pt reports that she would really rather not do PT today.  Her leg pain is more tolerable but she would like to wait until after she sees how re-vascularization goes.  Pt will have dialysis on Tuesday and apparently sx tomorrow.  Loran SentersGalen Brandilyn Nanninga, PT, DPT [phone] Malachi ProGalen R Kynzee Devinney 09/15/2015, 3:09 PM

## 2015-09-15 NOTE — Progress Notes (Signed)
Patient ID: [name], female   DOB: 03-03-1934, 82 y.o.   MRN: [id number] [facility] [place] [facility]   PATIENT NAME: [name]    MR#:  [id number]  DATE OF BIRTH:  [birth year]  SUBJECTIVE:  Comes in with increasing discoloration of the right foot along with significant amount of pain. Patient reports pain more in the nighttime  REVIEW OF SYSTEMS:   Review of Systems  Constitutional: Negative for fever, chills and weight loss.  HENT: Negative for ear discharge, ear pain and nosebleeds.   Eyes: Negative for blurred vision, pain and discharge.  Respiratory: Negative for sputum production, shortness of breath, wheezing and stridor.   Cardiovascular: Negative for chest pain, palpitations, orthopnea and PND.  Gastrointestinal: Negative for nausea, vomiting, abdominal pain and diarrhea.  Genitourinary: Negative for urgency and frequency.  Musculoskeletal: Positive for joint pain. Negative for back pain.       Foot pain bilaterally more on the right than left  Neurological: Negative for sensory change, speech change, focal weakness and weakness.  Psychiatric/Behavioral: Negative for depression and hallucinations. The patient is not nervous/anxious.   All other systems reviewed and are negative.  Tolerating Diet: Yes Tolerating PT: Pending  DRUG ALLERGIES:   Allergies  Allergen Reactions  . Codeine Other (See Comments)    Reaction:  Confusion/Hallucinations   . Contrast Media [Iodinated Diagnostic Agents] Other (See Comments)    "skin peel"  . Oxycodone Other (See Comments)    Reaction:  Confusion/Hallucinations   . Quinine Derivatives Rash    VITALS:  Blood pressure 137/58, pulse 70, temperature 97.9 F (36.6 C), temperature source Oral, resp. rate 16, height  (1.651 m), weight 74.662 kg (164 lb 9.6 oz), SpO2 93 %.  PHYSICAL EXAMINATION:   Physical Exam  GENERAL:  82 y.o.-year-old patient lying in the bed with no acute distress.   EYES: Pupils equal, round, reactive to light and accommodation. No scleral icterus. Extraocular muscles intact.  HEENT: Head atraumatic, normocephalic. Oropharynx and nasopharynx clear.  NECK:  Supple, no jugular venous distention. No thyroid enlargement, no tenderness.  LUNGS: Normal breath sounds bilaterally, no wheezing, rales, rhonchi. No use of accessory muscles of respiration.  CARDIOVASCULAR: S1, S2 normal. No murmurs, rubs, or gallops.  ABDOMEN: Soft, nontender, nondistended. Bowel sounds present. No organomegaly or mass.  EXTREMITIES: Right foot appears dusky/cyanotic and very feeble pulses  NEUROLOGIC: Cranial nerves II through XII are intact. No focal Motor or sensory deficits b/l.   PSYCHIATRIC:  patient is alert and oriented x 3.  SKIN: No obvious rash, lesion, or ulcer.   LABORATORY PANEL:  CBC  Recent Labs Lab 09/15/15 0549  WBC 12.6*  HGB 10.4*  HCT 31.2*  PLT 243    Chemistries   Recent Labs Lab 09/15/15 0549  NA 129*  K 4.3  CL 97*  CO2 19*  GLUCOSE 242*  BUN 42*  CREATININE 3.75*  CALCIUM 7.7*  AST 50*  ALT 20  ALKPHOS 64  BILITOT 0.5   Cardiac Enzymes No results for input(s): TROPONINI in the last 168 hours. RADIOLOGY:  Ct Angio Ao+bifem W/cm &/or Wo/cm  09/14/2015  CLINICAL DATA:  Right leg pain. EXAM: CT ANGIOGRAPHY AOBIFEM WITHOUT AND WITH CONTRAST TECHNIQUE: CT angiography of the aorta and lower extremity arteries was performed with reformats generated. CONTRAST:  125 cc Isovue 370 intravenous COMPARISON:  Chest CT 12/12/2014 FINDINGS: Replaced right hepatic artery to the SMA. Accessory right renal artery. Otherwise, aortic branching is standard.  There is diffuse atherosclerotic calcification of the aorta and visceral branches. No major branch occlusion is seen. Negative for aneurysm. No inflow disease to explain leg symptoms. Right leg: Atheromatous but widely patent right common femoral and superficial femoral arteries. Occluded popliteal  artery at the level of the knee with well-developed collaterals providing prompt and symmetric reconstituted collateral flow. On reformats, this appears to be an eccentric luminal lesion with defined margins and internal calcification, possible remote embolus. Symmetrically timed runoff that is 3 vessel at the ankle. Left leg: Diffusely atheromatous but widely patent left common femoral and superficial femoral arteries. The left profunda femoris is widely patent. Mild to moderate popliteal narrowing on the left. Three-vessel runoff at the ankle. Lower chest and abdominal wall: Cardiomegaly. Septal thickening at the bases with trace effusions, loculated complex appearing on the left with subpleural scarring or atelectasis. This is likely developing round atelectasis given 2016 chest CT appearance 8 mm nodule over the right diaphragm is stable since comparison CT. Hepatobiliary: No focal liver abnormality.No evidence of biliary obstruction or stone. Pancreas: Unremarkable. Spleen: Sub cm low-density in the medial spleen is nonspecific. Adrenals/Urinary Tract: Negative adrenals. Marked lack of enhancement of the kidneys in this patient with end-stage renal disease. 14 mm right upper pole calculus. No hydronephrosis or ureteral calculus. Bladder is obscured by streak artifact from hip prostheses. Reproductive:Negative Stomach/Bowel:  No obstruction. No appendicitis. Vascular/Lymphatic: No acute vascular abnormality. No mass or adenopathy. Peritoneal: No ascites or pneumoperitoneum. Musculoskeletal: L2-3 endplate erosions, sclerosis, and surrounding paraspinal inflammation. No indication of epidural collection or high-grade stenosis. There is mineralization at the level of the disc. Marked osteopenia.  Baker cyst on the left. Review of the MIP images confirms the above findings. IMPRESSION: 1. Occluded or critically stenotic right popliteal artery, chronic appearing given the degree of collateral formation with symmetric  three-vessel runoff. The stenotic lesion has an eccentric luminal based appearance and may have been embolic. 2. Negative for inflow disease. 3. L2-3 discitis versus dialysis related spondyloarthropathy, associated disc calcification favoring the latter. 4. Possible mild pulmonary edema. 5. Chronic small left pleural effusion and scarring. 6. 8 mm right lower lobe pulmonary nodule but is stable from 12/12/2014. 1 year follow-up CT could document stability (assuming patient is a treatment candidate). Electronically Signed   By: Marnee Spring M.D.   On: 09/14/2015 20:36   US Venous Img Lower Unilateral Right  09/14/2015  CLINICAL DATA:  82 year old female with a history of leg pain EXAM: RIGHT LOWER EXTREMITY VENOUS DOPPLER ULTRASOUND TECHNIQUE: Gray-scale sonography with graded compression, as well as color Doppler and duplex ultrasound were performed to evaluate the lower extremity deep venous systems from the level of the common femoral vein and including the common femoral, femoral, profunda femoral, popliteal and calf veins including the posterior tibial, peroneal and gastrocnemius veins when visible. The superficial great saphenous vein was also interrogated. Spectral Doppler was utilized to evaluate flow at rest and with distal augmentation maneuvers in the common femoral, femoral and popliteal veins. COMPARISON:  None. FINDINGS: Contralateral Common Femoral Vein: Respiratory phasicity is normal and symmetric with the symptomatic side. No evidence of thrombus. Normal compressibility. Common Femoral Vein: No evidence of thrombus. Normal compressibility, respiratory phasicity and response to augmentation. Saphenofemoral Junction: No evidence of thrombus. Normal compressibility and flow on color Doppler imaging. Profunda Femoral Vein: No evidence of thrombus. Normal compressibility and flow on color Doppler imaging. Femoral Vein: No evidence of thrombus. Normal compressibility, respiratory phasicity and  response to augmentation. Popliteal  Vein: No evidence of thrombus. Normal compressibility, respiratory phasicity and response to augmentation. Calf Veins: No evidence of thrombus. Normal compressibility and flow on color Doppler imaging. Superficial Great Saphenous Vein: No evidence of thrombus. Normal compressibility and flow on color Doppler imaging. Other Findings:  None. IMPRESSION: Sonographic survey of the right lower extremity negative for DVT. Signed, Yvone NeuJaime S. Loreta AveWagner, DO [facility] Electronically Signed   By: Gilmer MorJaime  Wagner D.O.   On: 09/14/2015 15:07   ASSESSMENT AND PLAN:  Helane RimaJoann Y Nichol is a 82 y.o. female has a past medical history significant for chronic a-fib, previous CVA, DM, ESRD on HD and PVD now with acute worsening pain of RLE. Presented to ER where RLE was tender and cold. INR subtherapeutic. Was seen by Vascular in the ER. CT suggested collateral flow around a presumed popliteal blockage  1. Acute on chronic peripheral vascular disease with significant claudication and rest pain right foot more than left -Patient on heparin drip. She already is on Coumadin as well for her INR is subtherapeutic -Seen by vascular. Patient will undergo vascular seizure tomorrow for right lower extremity blockage around the popliteal area which appears chronic how were given significant discoloration and pain patient will need to procedure for possible revascularization -When necessary pain meds  2. Hypertension continue home meds  3. Type 2 diabetes sliding scale and insulin  4. Acute chronic kidney disease stage IV Creatinine appears at baseline. Continue IV fluid for hydration Avoid nephrotoxins  Case discussed with Care Management/Social Worker. Management plans discussed with the patient, family and they are in agreement.  CODE STATUS: *Full  DVT Prophylaxis: On heparin drip and Coumadin.  TOTAL TIME TAKING CARE OF THIS  PATient 30** minutes.  >50% time spent on counselling and coordination of care  POSSIBLE D/C IN 2-3DAYS, DEPENDING ON CLINICAL CONDITION.  Note: This dictation was prepared with Dragon dictation along with smaller phrase technology. Any transcriptional errors that result from this process are unintentional.  Azreal Stthomas M.D on 09/15/2015 at 11:31 AM  Between 7am to 6pm - Pager - [phone]  After 6pm go to [email or web address] - password [email or web address] [facility]  [facility]  Office  [phone]  CC: Primary care physician; Rolm GalaGRANDIS, HEIDI, MD

## 2015-09-15 NOTE — Progress Notes (Signed)
ANTICOAGULATION CONSULT NOTE - Initial Consult  Pharmacy Consult for Heparin  Indication: atrial fibrillation  Allergies  Allergen Reactions  . Codeine Other (See Comments)    Reaction:  Confusion/Hallucinations   . Contrast Media [Iodinated Diagnostic Agents] Other (See Comments)    "skin peel"  . Oxycodone Other (See Comments)    Reaction:  Confusion/Hallucinations   . Quinine Derivatives Rash    Patient Measurements: Height: 5\' 5"  (165.1 cm) Weight: 164 lb 9.6 oz (74.662 kg) IBW/kg (Calculated) : 57 Heparin Dosing Weight: 71.6 kg   Vital Signs: Temp: 97.9 F (36.6 C) (04/16 0524) Temp Source: Oral (04/16 0524) BP: 137/58 mmHg (04/16 0524) Pulse Rate: 70 (04/16 0524)  Labs:  Recent Labs  09/14/15 1802 09/15/15 0549  HGB 10.9* 10.4*  HCT 32.8* 31.2*  PLT 250 243  LABPROT 21.6* 20.9*  INR 1.89 1.81  HEPARINUNFRC  --  0.17*  CREATININE 3.10* 3.75*    Estimated Creatinine Clearance: 12.3 mL/min (by C-G formula based on Cr of 3.75).   Medical History: Past Medical History  Diagnosis Date  . Chronic kidney disease   . Dysrhythmia   . Diabetes mellitus without complication (HCC)   . Hypertension   . Stroke (HCC)   . Arthritis     gout  . Dialysis patient (HCC)   . Pleural effusion   . Pulmonary hypertension (HCC)   . Renal insufficiency   . CHF (congestive heart failure) (HCC)   . Peripheral vascular disease (HCC)   . Shortness of breath dyspnea     with exertion  . GERD (gastroesophageal reflux disease)   . Anemia   . Restless leg syndrome   . Atrial fibrillation (HCC)     Medications:  Prescriptions prior to admission  Medication Sig Dispense Refill Last Dose  . acetaminophen (TYLENOL) 500 MG tablet Take 1,000 mg by mouth 2 (two) times daily.    07/25/2015 at Unknown time  . albuterol (PROVENTIL HFA;VENTOLIN HFA) 108 (90 Base) MCG/ACT inhaler Inhale 1-2 puffs into the lungs every 6 (six) hours as needed for wheezing or shortness of breath.   prn   . aspirin 81 MG chewable tablet Chew 1 tablet (81 mg total) by mouth daily. 30 tablet 6 08/12/2015 at 0730  . Biotin 1 MG CAPS Take 1 mg by mouth daily.    08/12/2015 at Unknown time  . bisacodyl (DULCOLAX) 5 MG EC tablet Take 10 mg by mouth daily as needed for moderate constipation.    prn  . calcium acetate, Phos Binder, (PHOSLYRA) 667 MG/5ML SOLN Take 1,334 mg by mouth 3 (three) times daily with meals.   08/12/2015 at Unknown time  . diltiazem (TIAZAC) 120 MG 24 hr capsule Take 120 mg by mouth daily.   08/12/2015 at Unknown time  . diphenhydrAMINE (BENADRYL) 25 mg capsule Take 1 capsule (25 mg total) by mouth every 6 (six) hours as needed (rash, itching). 30 capsule 0 prn  . docusate sodium (COLACE) 100 MG capsule Take 200 mg by mouth 2 (two) times daily as needed for mild constipation.   prn  . folic acid-vitamin b complex-vitamin c-selenium-zinc (DIALYVITE) 3 MG TABS tablet Take 1 tablet by mouth daily.   08/12/2015 at Unknown time  . furosemide (LASIX) 80 MG tablet Take 80 mg by mouth 2 (two) times daily.   08/12/2015 at Unknown time  . gabapentin (NEURONTIN) 100 MG capsule Take 100 mg by mouth at bedtime. Pt takes on Sunday, Monday, Wednesday, and Friday.   08/12/2015 at Unknown  time  . gabapentin (NEURONTIN) 300 MG capsule Take 300 mg by mouth daily. Pt takes on Tuesday, Thursday, and Saturday after dialysis.   08/10/2015 at Unknown time  . Insulin Detemir (LEVEMIR) 100 UNIT/ML Pen Inject 11 Units into the skin 2 (two) times daily. 0730, 1730   08/12/2015 at Unknown time  . levofloxacin (LEVAQUIN) 500 MG tablet Take 1 tablet (500 mg total) by mouth every other day. 3 tablet 0   . lidocaine-prilocaine (EMLA) cream Apply 1 application topically as needed (prior to accessing port).    prn  . nystatin (MYCOSTATIN/NYSTOP) 100000 UNIT/GM POWD Apply topically 2 (two) times daily as needed.   prn  . Omega-3 Fatty Acids (FISH OIL CONCENTRATE) 300 MG CAPS Take 1 capsule by mouth daily.   08/12/2015 at Unknown  time  . PARoxetine (PAXIL) 40 MG tablet Take 40 mg by mouth at bedtime.    08/12/2015 at Unknown time  . pravastatin (PRAVACHOL) 20 MG tablet Take 1 tablet (20 mg total) by mouth daily. 30 tablet 5 08/12/2015 at Unknown time  . ranitidine (ZANTAC) 150 MG tablet Take 150 mg by mouth 2 (two) times daily.    08/12/2015 at Unknown time  . senna-docusate (SENOKOT-S) 8.6-50 MG tablet Take 1 tablet by mouth 2 (two) times daily as needed.    prn  . traMADol (ULTRAM) 50 MG tablet Take 1 tablet (50 mg total) by mouth every 6 (six) hours as needed for moderate pain. 30 tablet 0 prn  . warfarin (COUMADIN) 3 MG tablet Take 1.5 mg by mouth daily. Everyday except Mondays   08/11/2015 at 2200    Assessment: Pharmacy consulted to dose heparin in this 82 year old female admitted with Afib.  Pt on warfarin at home but INR currently subtherapeutic.   4/15 : INR = 1.89  Will not bolus this pt.  CrCl = 14.7 ml/min  Goal of Therapy:  Heparin level 0.3-0.7 units/ml Monitor platelets by anticoagulation protocol: Yes   Plan:  Heparin level subtherapeutic. 2100 IV x 1 bolus and increase rate to 1300 units/hr. Recheck level in 8 hours.  [name], Pharm.D., BCPS Clinical Pharmacist 09/15/2015,7:08 AM

## 2015-09-15 NOTE — Progress Notes (Signed)
Daughter, [name], asked RN to leave three bed rails up at all times. [name] stated "that she likes to slide in the bed at night and to leave the door open because mom gets confused sometimes at night." Pt has a history of having multiple falls, bed alarm is on, fall risk band and socks were applied at this time. Will continue to monitor pt.    Karsten RoLauren E Hobbs

## 2015-09-15 NOTE — Progress Notes (Signed)
ANTICOAGULATION CONSULT NOTE - Initial Consult  Pharmacy Consult for Heparin  Indication: atrial fibrillation  Allergies  Allergen Reactions  . Codeine Other (See Comments)    Reaction:  Confusion/Hallucinations   . Contrast Media [Iodinated Diagnostic Agents] Other (See Comments)    "skin peel"  . Oxycodone Other (See Comments)    Reaction:  Confusion/Hallucinations   . Quinine Derivatives Rash    Patient Measurements: Height: 5\' 5"  (165.1 cm) Weight: 164 lb 9.6 oz (74.662 kg) IBW/kg (Calculated) : 57 Heparin Dosing Weight: 71.6 kg   Vital Signs: Temp: 97.9 F (36.6 C) (04/16 0524) Temp Source: Oral (04/16 0524) BP: 137/58 mmHg (04/16 0524) Pulse Rate: 70 (04/16 0524)  Labs:  Recent Labs  09/14/15 1802 09/15/15 0549 09/15/15 1549  HGB 10.9* 10.4*  --   HCT 32.8* 31.2*  --   PLT 250 243  --   LABPROT 21.6* 20.9*  --   INR 1.89 1.81  --   HEPARINUNFRC  --  0.17* 0.37  CREATININE 3.10* 3.75*  --     Estimated Creatinine Clearance: 12.3 mL/min (by C-G formula based on Cr of 3.75).   Medical History: Past Medical History  Diagnosis Date  . Chronic kidney disease   . Dysrhythmia   . Diabetes mellitus without complication (HCC)   . Hypertension   . Stroke (HCC)   . Arthritis     gout  . Dialysis patient (HCC)   . Pleural effusion   . Pulmonary hypertension (HCC)   . Renal insufficiency   . CHF (congestive heart failure) (HCC)   . Peripheral vascular disease (HCC)   . Shortness of breath dyspnea     with exertion  . GERD (gastroesophageal reflux disease)   . Anemia   . Restless leg syndrome   . Atrial fibrillation (HCC)     Medications:  Prescriptions prior to admission  Medication Sig Dispense Refill Last Dose  . acetaminophen (TYLENOL) 500 MG tablet Take 1,000 mg by mouth 2 (two) times daily.    07/25/2015 at Unknown time  . albuterol (PROVENTIL HFA;VENTOLIN HFA) 108 (90 Base) MCG/ACT inhaler Inhale 1-2 puffs into the lungs every 6 (six) hours as  needed for wheezing or shortness of breath.   prn  . aspirin 81 MG chewable tablet Chew 1 tablet (81 mg total) by mouth daily. 30 tablet 6 08/12/2015 at 0730  . Biotin 1 MG CAPS Take 1 mg by mouth daily.    08/12/2015 at Unknown time  . bisacodyl (DULCOLAX) 5 MG EC tablet Take 10 mg by mouth daily as needed for moderate constipation.    prn  . calcium acetate, Phos Binder, (PHOSLYRA) 667 MG/5ML SOLN Take 1,334 mg by mouth 3 (three) times daily with meals.   08/12/2015 at Unknown time  . diltiazem (TIAZAC) 120 MG 24 hr capsule Take 120 mg by mouth daily.   08/12/2015 at Unknown time  . diphenhydrAMINE (BENADRYL) 25 mg capsule Take 1 capsule (25 mg total) by mouth every 6 (six) hours as needed (rash, itching). 30 capsule 0 prn  . docusate sodium (COLACE) 100 MG capsule Take 200 mg by mouth 2 (two) times daily as needed for mild constipation.   prn  . folic acid-vitamin b complex-vitamin c-selenium-zinc (DIALYVITE) 3 MG TABS tablet Take 1 tablet by mouth daily.   08/12/2015 at Unknown time  . furosemide (LASIX) 80 MG tablet Take 80 mg by mouth 2 (two) times daily.   08/12/2015 at Unknown time  . gabapentin (NEURONTIN) 100 MG  capsule Take 100 mg by mouth at bedtime. Pt takes on Sunday, Monday, Wednesday, and Friday.   08/12/2015 at Unknown time  . gabapentin (NEURONTIN) 300 MG capsule Take 300 mg by mouth daily. Pt takes on Tuesday, Thursday, and Saturday after dialysis.   08/10/2015 at Unknown time  . Insulin Detemir (LEVEMIR) 100 UNIT/ML Pen Inject 11 Units into the skin 2 (two) times daily. 0730, 1730   08/12/2015 at Unknown time  . levofloxacin (LEVAQUIN) 500 MG tablet Take 1 tablet (500 mg total) by mouth every other day. 3 tablet 0   . lidocaine-prilocaine (EMLA) cream Apply 1 application topically as needed (prior to accessing port).    prn  . nystatin (MYCOSTATIN/NYSTOP) 100000 UNIT/GM POWD Apply topically 2 (two) times daily as needed.   prn  . Omega-3 Fatty Acids (FISH OIL CONCENTRATE) 300 MG CAPS Take  1 capsule by mouth daily.   08/12/2015 at Unknown time  . PARoxetine (PAXIL) 40 MG tablet Take 40 mg by mouth at bedtime.    08/12/2015 at Unknown time  . pravastatin (PRAVACHOL) 20 MG tablet Take 1 tablet (20 mg total) by mouth daily. 30 tablet 5 08/12/2015 at Unknown time  . ranitidine (ZANTAC) 150 MG tablet Take 150 mg by mouth 2 (two) times daily.    08/12/2015 at Unknown time  . senna-docusate (SENOKOT-S) 8.6-50 MG tablet Take 1 tablet by mouth 2 (two) times daily as needed.    prn  . traMADol (ULTRAM) 50 MG tablet Take 1 tablet (50 mg total) by mouth every 6 (six) hours as needed for moderate pain. 30 tablet 0 prn  . warfarin (COUMADIN) 3 MG tablet Take 1.5 mg by mouth daily. Everyday except Mondays   08/11/2015 at 2200    Assessment: Pharmacy consulted to dose heparin in this 82 year old female admitted with Afib.  Pt on warfarin at home but INR currently subtherapeutic.   4/15 : INR = 1.89  Will not bolus this pt.  CrCl = 14.7 ml/min  Goal of Therapy:  Heparin level 0.3-0.7 units/ml Monitor platelets by anticoagulation protocol: Yes   Plan:  Heparin level subtherapeutic. 2100 IV x 1 bolus and increase rate to 1300 units/hr. Recheck level in 8 hours.  4/16:  HL @ 16:00 = 0.37.  Will continue on current rate of 1300 units/hr and recheck HL on 4/17 @ 00:00.   Aiyannah Fayad D, Pharm.D Clinical Pharmacist 09/15/2015,4:33 PM

## 2015-09-15 NOTE — Progress Notes (Signed)
  Subjective:  Doing OK. Pain unchanged from yesterday. Without further complaints.  Objective: Vital signs in last 24 hours: Temp:  [97.6 F (36.4 C)-98 F (36.7 C)] 97.9 F (36.6 C) (04/16 0524) Pulse Rate:  [70-112] 70 (04/16 0524) Resp:  [16-22] 16 (04/16 0524) BP: (114-152)/(51-97) 137/58 mmHg (04/16 0524) SpO2:  [93 %-100 %] 93 % (04/16 0524) Weight:  [72.576 kg (160 lb)-74.662 kg (164 lb 9.6 oz)] 74.662 kg (164 lb 9.6 oz) (04/16 0400) Last BM Date: 09/13/15  Intake/Output from previous day: 04/15 0701 - 04/16 0700 In: 866.2 [I.V.:759.1; IV Piggyback:107.1] Out: -  Intake/Output this shift:   General: Alert, NAD CV: Irregular, regular Ext: right: warm to midfoot, toes slightly warmer than previous exam, motor, sensory unchanged- diminished sensory secondary to baseline neuropathy.   Lab Results:   Recent Labs  09/14/15 1802 09/15/15 0549  WBC 9.9 12.6*  HGB 10.9* 10.4*  HCT 32.8* 31.2*  PLT 250 243   BMET  Recent Labs  09/14/15 1802 09/15/15 0549  NA 130* 129*  K 3.0* 4.3  CL 95* 97*  CO2 22 19*  GLUCOSE 216* 242*  BUN 34* 42*  CREATININE 3.10* 3.75*  CALCIUM 8.0* 7.7*   PT/INR  Recent Labs  09/14/15 1802 09/15/15 0549  LABPROT 21.6* 20.9*  INR 1.89 1.81       Assessment/Plan:   Right lower extremity chronic ischemia with microemboli to toes.  Symptoms unchanged.  Will plan for operative intervention tomorrow per Dr. Wyn Quakerew.  Cardiology eval.   Will require HD tomorrow also.  Ensure PTT/Heparin therapeutic  NPO after MN  Bobbie Virden A 09/15/2015

## 2015-09-16 ENCOUNTER — Ambulatory Visit: Payer: Medicare Other | Admitting: General Surgery

## 2015-09-16 ENCOUNTER — Encounter
Admission: EM | Disposition: A | Discharge status: Home / Self Care | Payer: Self-pay | Source: Home / Self Care | Attending: Internal Medicine

## 2015-09-16 HISTORY — PX: PERIPHERAL VASCULAR CATHETERIZATION: SHX172C

## 2015-09-16 LAB — GLUCOSE, CAPILLARY
Glucose-Capillary: 74 mg/dL (ref 65–99)
Glucose-Capillary: 82 mg/dL (ref 65–99)
Glucose-Capillary: 76 mg/dL (ref 65–99)

## 2015-09-16 LAB — PROTIME-INR
INR: 2.26
PROTHROMBIN TIME: 24.7 s — AB (ref 11.4–15.0)

## 2015-09-16 LAB — HEPARIN LEVEL (UNFRACTIONATED)
Heparin Unfractionated: 0.24 IU/mL — ABNORMAL LOW (ref 0.30–0.70)
Heparin Unfractionated: 0.26 IU/mL — ABNORMAL LOW (ref 0.30–0.70)
Heparin Unfractionated: 0.51 IU/mL (ref 0.30–0.70)

## 2015-09-16 SURGERY — LOWER EXTREMITY ANGIOGRAPHY
Anesthesia: Moderate Sedation

## 2015-09-16 MED ORDER — CALCIUM ACETATE (PHOS BINDER) 667 MG PO CAPS
1334.0000 mg | ORAL_CAPSULE | Freq: Three times a day (TID) | ORAL | Status: DC
  Administered 2015-09-18 (×2): 1334 mg via ORAL
  Filled 2015-09-16 (×2): qty 2

## 2015-09-16 MED ORDER — FAMOTIDINE 20 MG PO TABS
ORAL_TABLET | ORAL | Status: AC
  Administered 2015-09-16: 20 mg
  Filled 2015-09-16: qty 1

## 2015-09-16 MED ORDER — IOPAMIDOL (ISOVUE-300) INJECTION 61%
INTRAVENOUS | Status: DC | PRN
Start: 2015-09-16 — End: 2015-09-16
  Administered 2015-09-16: 70 mL via INTRAVENOUS

## 2015-09-16 MED ORDER — HEPARIN (PORCINE) IN NACL 100-0.45 UNIT/ML-% IJ SOLN
1450.0000 [IU]/h | INTRAMUSCULAR | Status: DC
  Administered 2015-09-16: 1450 [IU]/h via INTRAVENOUS

## 2015-09-16 MED ORDER — HEPARIN SODIUM (PORCINE) 1000 UNIT/ML IJ SOLN
INTRAMUSCULAR | Status: DC | PRN
Start: 2015-09-16 — End: 2015-09-16
  Administered 2015-09-16: 3000 [IU] via INTRAVENOUS

## 2015-09-16 MED ORDER — HEPARIN BOLUS VIA INFUSION
1100.0000 [IU] | Freq: Once | INTRAVENOUS | Status: AC
  Administered 2015-09-16: 1100 [IU] via INTRAVENOUS
  Filled 2015-09-16: qty 1100

## 2015-09-16 MED ORDER — FENTANYL CITRATE (PF) 100 MCG/2ML IJ SOLN
INTRAMUSCULAR | Status: DC | PRN
  Administered 2015-09-16 (×2): 25 ug via INTRAVENOUS
  Administered 2015-09-16: 50 ug via INTRAVENOUS

## 2015-09-16 MED ORDER — MIDAZOLAM HCL 2 MG/2ML IJ SOLN
INTRAMUSCULAR | Status: DC | PRN
Start: 2015-09-16 — End: 2015-09-16
  Administered 2015-09-16 (×2): 1 mg via INTRAVENOUS

## 2015-09-16 MED ORDER — METHYLPREDNISOLONE SODIUM SUCC 125 MG IJ SOLR
INTRAMUSCULAR | Status: AC
  Administered 2015-09-16: 125 mg
  Filled 2015-09-16: qty 2

## 2015-09-16 MED ORDER — MIDAZOLAM HCL 5 MG/5ML IJ SOLN
INTRAMUSCULAR | Status: AC
  Filled 2015-09-16: qty 5

## 2015-09-16 MED ORDER — SODIUM CHLORIDE 0.9 % IV SOLN
INTRAVENOUS | Status: DC
  Administered 2015-09-16: 16:00:00 via INTRAVENOUS

## 2015-09-16 MED ORDER — HEPARIN SODIUM (PORCINE) 1000 UNIT/ML IJ SOLN
INTRAMUSCULAR | Status: AC
  Filled 2015-09-16: qty 1

## 2015-09-16 MED ORDER — HEPARIN BOLUS VIA INFUSION
1000.0000 [IU] | Freq: Once | INTRAVENOUS | Status: DC
  Filled 2015-09-16: qty 1000

## 2015-09-16 MED ORDER — COLLAGENASE 250 UNIT/GM EX OINT
TOPICAL_OINTMENT | Freq: Every day | CUTANEOUS | Status: DC
  Administered 2015-09-17 – 2015-09-18 (×2): via TOPICAL
  Filled 2015-09-16: qty 30

## 2015-09-16 MED ORDER — LIDOCAINE-EPINEPHRINE (PF) 1 %-1:200000 IJ SOLN
INTRAMUSCULAR | Status: AC
  Filled 2015-09-16: qty 30

## 2015-09-16 MED ORDER — HEPARIN (PORCINE) IN NACL 2-0.9 UNIT/ML-% IJ SOLN
INTRAMUSCULAR | Status: AC
  Filled 2015-09-16: qty 1500

## 2015-09-16 MED ORDER — FENTANYL CITRATE (PF) 100 MCG/2ML IJ SOLN
INTRAMUSCULAR | Status: AC
  Filled 2015-09-16: qty 2

## 2015-09-16 SURGICAL SUPPLY — 20 items
BALLN ULTRVRSE 3X150X150 (BALLOONS) ×4
BALLOON ULTRVRSE 3X150X150 (BALLOONS) ×2 IMPLANT
CANISTER PENUMBRA MAX (MISCELLANEOUS) ×4 IMPLANT
CATH INDIGO 6 ST-TIP 135CM (CATHETERS) ×4 IMPLANT
CATH PIG 70CM (CATHETERS) ×4 IMPLANT
CATH VERT 100CM (CATHETERS) ×4 IMPLANT
DEVICE PRESTO INFLATION (MISCELLANEOUS) ×4 IMPLANT
DEVICE STARCLOSE SE CLOSURE (Vascular Products) ×4 IMPLANT
GLIDEWIRE ADV .035X260CM (WIRE) ×4 IMPLANT
GUIDEWIRE PFTE-COATED .018X300 (WIRE) ×4 IMPLANT
PACK ANGIOGRAPHY (CUSTOM PROCEDURE TRAY) ×4 IMPLANT
SHEATH ANL 5FRX90 (SHEATH) ×4 IMPLANT
SHEATH BRITE TIP 5FRX11 (SHEATH) ×4 IMPLANT
SHEATH BRITE TIP 6FRX11 (SHEATH) ×4 IMPLANT
SHEATH PINNACLE ST 6F 65CM (SHEATH) ×4 IMPLANT
SYR MEDRAD MARK V 150ML (SYRINGE) ×4 IMPLANT
TUBING ASPIRATION INDIGO (MISCELLANEOUS) ×4 IMPLANT
TUBING CONTRAST HIGH PRESS 72 (TUBING) ×4 IMPLANT
VALVE CHECKFLO PERFORMER (SHEATH) ×4 IMPLANT
WIRE J 3MM .035X145CM (WIRE) ×4 IMPLANT

## 2015-09-16 NOTE — Care Management Important Message (Signed)
Important Message  Patient Details  Name: Helane RimaJoann Y Stowers MRN: [id number] Date of Birth: [birth year]   Medicare Important Message Given:       Verita SchneidersKathy A Carlee Tesfaye 09/16/2015, 1:34 PM

## 2015-09-16 NOTE — Progress Notes (Signed)
ANTICOAGULATION CONSULT NOTE - FOLLOW UP   Pharmacy Consult for Heparin  Indication: atrial fibrillation  Allergies  Allergen Reactions  . Codeine Other (See Comments)    Reaction:  Confusion/Hallucinations   . Contrast Media [Iodinated Diagnostic Agents] Other (See Comments)    "skin peel"  . Oxycodone Other (See Comments)    Reaction:  Confusion/Hallucinations   . Quinine Derivatives Rash    Patient Measurements: Height:  (165.1 cm) Weight: 164 lb 9.6 oz (74.662 kg) IBW/kg (Calculated) : 57 Heparin Dosing Weight: 71.6 kg   Vital Signs: Temp: 97.9 F (36.6 C) (04/17 0602) Temp Source: Oral (04/17 0602) BP: 112/51 mmHg (04/17 0602) Pulse Rate: 98 (04/17 0602)  Labs:  Recent Labs  09/14/15 1802  09/15/15 0549 09/15/15 1549 09/16/15 0002 09/16/15 0937  HGB 10.9*  --  10.4*  --   --   --   HCT 32.8*  --  31.2*  --   --   --   PLT 250  --  243  --   --   --   LABPROT 21.6*  --  20.9*  --  24.7*  --   INR 1.89  --  1.81  --  2.26  --   HEPARINUNFRC  --   < > 0.17* 0.37 0.26* 0.51  CREATININE 3.10*  --  3.75*  --   --   --   < > = values in this interval not displayed.  Estimated Creatinine Clearance: 12.3 mL/min (by C-G formula based on Cr of 3.75).   Medical History: Past Medical History  Diagnosis Date  . Chronic kidney disease   . Dysrhythmia   . Diabetes mellitus without complication (HCC)   . Hypertension   . Stroke (HCC)   . Arthritis     gout  . Dialysis patient (HCC)   . Pleural effusion   . Pulmonary hypertension (HCC)   . Renal insufficiency   . CHF (congestive heart failure) (HCC)   . Peripheral vascular disease (HCC)   . Shortness of breath dyspnea     with exertion  . GERD (gastroesophageal reflux disease)   . Anemia   . Restless leg syndrome   . Atrial fibrillation (HCC)     Medications:  Prescriptions prior to admission  Medication Sig Dispense Refill Last Dose  . acetaminophen (TYLENOL) 500 MG tablet Take 1,000 mg by mouth 2  (two) times daily.    07/25/2015 at Unknown time  . albuterol (PROVENTIL HFA;VENTOLIN HFA) 108 (90 Base) MCG/ACT inhaler Inhale 1-2 puffs into the lungs every 6 (six) hours as needed for wheezing or shortness of breath.   prn  . aspirin 81 MG chewable tablet Chew 1 tablet (81 mg total) by mouth daily. 30 tablet 6 08/12/2015 at 0730  . Biotin 1 MG CAPS Take 1 mg by mouth daily.    08/12/2015 at Unknown time  . bisacodyl (DULCOLAX) 5 MG EC tablet Take 10 mg by mouth daily as needed for moderate constipation.    prn  . calcium acetate, Phos Binder, (PHOSLYRA) 667 MG/5ML SOLN Take 1,334 mg by mouth 3 (three) times daily with meals.   08/12/2015 at Unknown time  . diltiazem (TIAZAC) 120 MG 24 hr capsule Take 120 mg by mouth daily.   08/12/2015 at Unknown time  . diphenhydrAMINE (BENADRYL) 25 mg capsule Take 1 capsule (25 mg total) by mouth every 6 (six) hours as needed (rash, itching). 30 capsule 0 prn  . docusate sodium (COLACE) 100 MG capsule  Take 200 mg by mouth 2 (two) times daily as needed for mild constipation.   prn  . folic acid-vitamin b complex-vitamin c-selenium-zinc (DIALYVITE) 3 MG TABS tablet Take 1 tablet by mouth daily.   08/12/2015 at Unknown time  . furosemide (LASIX) 80 MG tablet Take 80 mg by mouth 2 (two) times daily.   08/12/2015 at Unknown time  . gabapentin (NEURONTIN) 100 MG capsule Take 100 mg by mouth at bedtime. Pt takes on Sunday, Monday, Wednesday, and Friday.   08/12/2015 at Unknown time  . gabapentin (NEURONTIN) 300 MG capsule Take 300 mg by mouth daily. Pt takes on Tuesday, Thursday, and Saturday after dialysis.   08/10/2015 at Unknown time  . Insulin Detemir (LEVEMIR) 100 UNIT/ML Pen Inject 11 Units into the skin 2 (two) times daily. 0730, 1730   08/12/2015 at Unknown time  . levofloxacin (LEVAQUIN) 500 MG tablet Take 1 tablet (500 mg total) by mouth every other day. 3 tablet 0   . lidocaine-prilocaine (EMLA) cream Apply 1 application topically as needed (prior to accessing port).     prn  . nystatin (MYCOSTATIN/NYSTOP) 100000 UNIT/GM POWD Apply topically 2 (two) times daily as needed.   prn  . Omega-3 Fatty Acids (FISH OIL CONCENTRATE) 300 MG CAPS Take 1 capsule by mouth daily.   08/12/2015 at Unknown time  . PARoxetine (PAXIL) 40 MG tablet Take 40 mg by mouth at bedtime.    08/12/2015 at Unknown time  . pravastatin (PRAVACHOL) 20 MG tablet Take 1 tablet (20 mg total) by mouth daily. 30 tablet 5 08/12/2015 at Unknown time  . ranitidine (ZANTAC) 150 MG tablet Take 150 mg by mouth 2 (two) times daily.    08/12/2015 at Unknown time  . senna-docusate (SENOKOT-S) 8.6-50 MG tablet Take 1 tablet by mouth 2 (two) times daily as needed.    prn  . traMADol (ULTRAM) 50 MG tablet Take 1 tablet (50 mg total) by mouth every 6 (six) hours as needed for moderate pain. 30 tablet 0 prn  . warfarin (COUMADIN) 3 MG tablet Take 1.5 mg by mouth daily. Everyday except Mondays   08/11/2015 at 2200    Assessment: Pharmacy consulted to dose heparin in this 82 year old female admitted with Afib.  Pt on warfarin at home but INR currently subtherapeutic.   Will not bolus this pt.  CrCl = 14.7 ml/min    Goal of Therapy:  Heparin level 0.3-0.7 units/ml Monitor platelets by anticoagulation protocol: Yes   Plan:  Heparin level within goal range. Will continue current heparin gtt rate and will recheck heparin level @ 17:30.   Shamon Lobo D, Pharm.D., BCPS Clinical Pharmacist 09/16/2015,10:22 AM

## 2015-09-16 NOTE — Progress Notes (Signed)
Patient ID: [name], female   DOB: [birth year], 82 y.o.   MRN: [id number] [facility] [place] [facility]   PATIENT NAME: [name]    MR#:  [id number]  DATE OF BIRTH:  [birth year]  SUBJECTIVE:  Comes in with increasing discoloration of the right foot along with significant amount of pain. Continues to have pain. NPO for vascular procedure  REVIEW OF SYSTEMS:   Review of Systems  Constitutional: Negative for fever, chills and weight loss.  HENT: Negative for ear discharge, ear pain and nosebleeds.   Eyes: Negative for blurred vision, pain and discharge.  Respiratory: Negative for sputum production, shortness of breath, wheezing and stridor.   Cardiovascular: Negative for chest pain, palpitations, orthopnea and PND.  Gastrointestinal: Negative for nausea, vomiting, abdominal pain and diarrhea.  Genitourinary: Negative for urgency and frequency.  Musculoskeletal: Positive for joint pain. Negative for back pain.       Foot pain bilaterally more on the right than left  Neurological: Negative for sensory change, speech change, focal weakness and weakness.  Psychiatric/Behavioral: Negative for depression and hallucinations. The patient is not nervous/anxious.   All other systems reviewed and are negative.   DRUG ALLERGIES:   Allergies  Allergen Reactions  . Codeine Other (See Comments)    Reaction:  Confusion/Hallucinations   . Contrast Media [Iodinated Diagnostic Agents] Other (See Comments)    "skin peel"  . Oxycodone Other (See Comments)    Reaction:  Confusion/Hallucinations   . Quinine Derivatives Rash    VITALS:  Blood pressure 112/51, pulse 98, temperature 97.9 F (36.6 C), temperature source Oral, resp. rate 20, height 5\' 5"  (1.651 m), weight 74.662 kg (164 lb 9.6 oz), SpO2 94 %.  PHYSICAL EXAMINATION:   Physical Exam  GENERAL:  82 y.o.-year-old patient lying in the bed with no acute distress.  EYES: Pupils equal, round,  reactive to light and accommodation. No scleral icterus. Extraocular muscles intact.  HEENT: Head atraumatic, normocephalic. Oropharynx and nasopharynx clear.  NECK:  Supple, no jugular venous distention. No thyroid enlargement, no tenderness.  LUNGS: Normal breath sounds bilaterally, no wheezing, rales, rhonchi. No use of accessory muscles of respiration.  CARDIOVASCULAR: S1, S2 normal. No murmurs, rubs, or gallops.  ABDOMEN: Soft, nontender, nondistended. Bowel sounds present. No organomegaly or mass.  EXTREMITIES: Right foot appears dusky/cyanotic and very feeble pulses  NEUROLOGIC: Cranial nerves II through XII are intact. No focal Motor or sensory deficits b/l.   PSYCHIATRIC:  patient is alert and oriented x 3.  SKIN: No obvious rash, lesion, or ulcer.   LABORATORY PANEL:  CBC  Recent Labs Lab 09/15/15 0549  WBC 12.6*  HGB 10.4*  HCT 31.2*  PLT 243    Chemistries   Recent Labs Lab 09/15/15 0549  NA 129*  K 4.3  CL 97*  CO2 19*  GLUCOSE 242*  BUN 42*  CREATININE 3.75*  CALCIUM 7.7*  AST 50*  ALT 20  ALKPHOS 64  BILITOT 0.5   Cardiac Enzymes No results for input(s): TROPONINI in the last 168 hours. RADIOLOGY:  Ct Angio Ao+bifem W/cm &/or Wo/cm  09/14/2015  CLINICAL DATA:  Right leg pain. EXAM: CT ANGIOGRAPHY AOBIFEM WITHOUT AND WITH CONTRAST TECHNIQUE: CT angiography of the aorta and lower extremity arteries was performed with reformats generated. CONTRAST:  125 cc Isovue 370 intravenous COMPARISON:  Chest CT 12/12/2014 FINDINGS: Replaced right hepatic artery to the SMA. Accessory right renal artery. Otherwise, aortic branching is standard. There is diffuse atherosclerotic calcification  of the aorta and visceral branches. No major branch occlusion is seen. Negative for aneurysm. No inflow disease to explain leg symptoms. Right leg: Atheromatous but widely patent right common femoral and superficial femoral arteries. Occluded popliteal artery at the level of the knee  with well-developed collaterals providing prompt and symmetric reconstituted collateral flow. On reformats, this appears to be an eccentric luminal lesion with defined margins and internal calcification, possible remote embolus. Symmetrically timed runoff that is 3 vessel at the ankle. Left leg: Diffusely atheromatous but widely patent left common femoral and superficial femoral arteries. The left profunda femoris is widely patent. Mild to moderate popliteal narrowing on the left. Three-vessel runoff at the ankle. Lower chest and abdominal wall: Cardiomegaly. Septal thickening at the bases with trace effusions, loculated complex appearing on the left with subpleural scarring or atelectasis. This is likely developing round atelectasis given 2016 chest CT appearance 8 mm nodule over the right diaphragm is stable since comparison CT. Hepatobiliary: No focal liver abnormality.No evidence of biliary obstruction or stone. Pancreas: Unremarkable. Spleen: Sub cm low-density in the medial spleen is nonspecific. Adrenals/Urinary Tract: Negative adrenals. Marked lack of enhancement of the kidneys in this patient with end-stage renal disease. 14 mm right upper pole calculus. No hydronephrosis or ureteral calculus. Bladder is obscured by streak artifact from hip prostheses. Reproductive:Negative Stomach/Bowel:  No obstruction. No appendicitis. Vascular/Lymphatic: No acute vascular abnormality. No mass or adenopathy. Peritoneal: No ascites or pneumoperitoneum. Musculoskeletal: L2-3 endplate erosions, sclerosis, and surrounding paraspinal inflammation. No indication of epidural collection or high-grade stenosis. There is mineralization at the level of the disc. Marked osteopenia.  Baker cyst on the left. Review of the MIP images confirms the above findings. IMPRESSION: 1. Occluded or critically stenotic right popliteal artery, chronic appearing given the degree of collateral formation with symmetric three-vessel runoff. The  stenotic lesion has an eccentric luminal based appearance and may have been embolic. 2. Negative for inflow disease. 3. L2-3 discitis versus dialysis related spondyloarthropathy, associated disc calcification favoring the latter. 4. Possible mild pulmonary edema. 5. Chronic small left pleural effusion and scarring. 6. 8 mm right lower lobe pulmonary nodule but is stable from 12/12/2014. 1 year follow-up CT could document stability (assuming patient is a treatment candidate). Electronically Signed   By: Marnee Spring M.D.   On: 09/14/2015 20:36   US Venous Img Lower Unilateral Right  09/14/2015  CLINICAL DATA:  82 year old female with a history of leg pain EXAM: RIGHT LOWER EXTREMITY VENOUS DOPPLER ULTRASOUND TECHNIQUE: Gray-scale sonography with graded compression, as well as color Doppler and duplex ultrasound were performed to evaluate the lower extremity deep venous systems from the level of the common femoral vein and including the common femoral, femoral, profunda femoral, popliteal and calf veins including the posterior tibial, peroneal and gastrocnemius veins when visible. The superficial great saphenous vein was also interrogated. Spectral Doppler was utilized to evaluate flow at rest and with distal augmentation maneuvers in the common femoral, femoral and popliteal veins. COMPARISON:  None. FINDINGS: Contralateral Common Femoral Vein: Respiratory phasicity is normal and symmetric with the symptomatic side. No evidence of thrombus. Normal compressibility. Common Femoral Vein: No evidence of thrombus. Normal compressibility, respiratory phasicity and response to augmentation. Saphenofemoral Junction: No evidence of thrombus. Normal compressibility and flow on color Doppler imaging. Profunda Femoral Vein: No evidence of thrombus. Normal compressibility and flow on color Doppler imaging. Femoral Vein: No evidence of thrombus. Normal compressibility, respiratory phasicity and response to augmentation.  Popliteal Vein: No evidence of thrombus.  Normal compressibility, respiratory phasicity and response to augmentation. Calf Veins: No evidence of thrombus. Normal compressibility and flow on color Doppler imaging. Superficial Great Saphenous Vein: No evidence of thrombus. Normal compressibility and flow on color Doppler imaging. Other Findings:  None. IMPRESSION: Sonographic survey of the right lower extremity negative for DVT. Signed, Yvone Neu. Loreta Ave, DO [facility] Electronically Signed   By: Gilmer Mor D.O.   On: 09/14/2015 15:07   ASSESSMENT AND PLAN:  [name] is a 82 y.o. female has a past medical history significant for chronic a-fib, previous CVA, DM, ESRD on HD and PVD now with acute worsening pain of RLE. Presented to ER where RLE was tender and cold. INR subtherapeutic  * Acute on chronic peripheral vascular disease with significant claudication and rest pain right foot more than left -Patient on heparin drip. Coumadin on Hold -Seen by vascular. Patient will undergo vascular procedure tomorrow for right lower extremity blockage around the popliteal area which appears chronic. But due to significant discoloration and pain patient will need to procedure for possible revascularization -When necessary pain meds  * Hypertension continue home meds  * Type 2 diabetes sliding scale and insulin  * ESRD Nephrology on board for HD.  Case discussed with Care Management/Social Worker. Management plans discussed with the patient, family and they are in agreement.  CODE STATUS: Full  DVT Prophylaxis: On heparin drip  TOTAL TIME TAKING CARE OF THIS PATIENT - 30 minutes.   POSSIBLE D/C IN 2-3DAYS, DEPENDING ON CLINICAL CONDITION.  Note: This dictation was prepared with Dragon dictation along with smaller phrase technology. Any transcriptional errors that result from this process are unintentional.  Milagros Loll R M.D on  09/16/2015 at 9:35 AM  Between 7am to 6pm - Pager - [phone]  After 6pm go to [email or web address] - password [email or web address] [facility]  [facility] [facility]  Office  [phone]  CC: Primary care physician; [name], MD

## 2015-09-16 NOTE — Progress Notes (Signed)
PT Cancellation Note  Patient Details Name: [name] MRN: [id number] DOB: 1933-11-26   Cancelled Treatment:    Reason Eval/Treat Not Completed: Patient not medically ready;Other (comment). Pt chart reviewed and discussed pt with RN. She is currently pending a re-vascularization for right lower extremity blockage around the popliteal area which appears chronic. PT will f/u at a later time and complete evaluation when appropriate.   Adelene IdlerMindy Jo Chinedu Agustin, PT, DPT  09/16/2015, 10:11 AM [phone]

## 2015-09-16 NOTE — Progress Notes (Deleted)
ANTICOAGULATION CONSULT NOTE - FOLLOW UP   Pharmacy Consult for Heparin  Indication: atrial fibrillation  Allergies  Allergen Reactions  . Codeine Other (See Comments)    Reaction:  Confusion/Hallucinations   . Contrast Media [Iodinated Diagnostic Agents] Other (See Comments)    "skin peel"  . Oxycodone Other (See Comments)    Reaction:  Confusion/Hallucinations   . Quinine Derivatives Rash    Patient Measurements: Height:  (165.1 cm) Weight: 164 lb 9.6 oz (74.662 kg) IBW/kg (Calculated) : 57 Heparin Dosing Weight: 71.6 kg   Vital Signs: Temp: 98.3 F (36.8 C) (04/17 1849) Temp Source: Oral (04/17 1544) BP: 121/50 mmHg (04/17 1849) Pulse Rate: 72 (04/17 1849)  Labs:  Recent Labs  09/14/15 1802 09/15/15 0549  09/16/15 0002 09/16/15 0937 09/16/15 1904  HGB 10.9* 10.4*  --   --   --   --   HCT 32.8* 31.2*  --   --   --   --   PLT 250 243  --   --   --   --   LABPROT 21.6* 20.9*  --  24.7*  --   --   INR 1.89 1.81  --  2.26  --   --   HEPARINUNFRC  --  0.17*  < > 0.26* 0.51 0.24*  CREATININE 3.10* 3.75*  --   --   --   --   < > = values in this interval not displayed.  Estimated Creatinine Clearance: 12.3 mL/min (by C-G formula based on Cr of 3.75).   Medical History: Past Medical History  Diagnosis Date  . Chronic kidney disease   . Dysrhythmia   . Diabetes mellitus without complication (HCC)   . Hypertension   . Stroke (HCC)   . Arthritis     gout  . Dialysis patient (HCC)   . Pleural effusion   . Pulmonary hypertension (HCC)   . Renal insufficiency   . CHF (congestive heart failure) (HCC)   . Peripheral vascular disease (HCC)   . Shortness of breath dyspnea     with exertion  . GERD (gastroesophageal reflux disease)   . Anemia   . Restless leg syndrome   . Atrial fibrillation (HCC)     Medications:  Prescriptions prior to admission  Medication Sig Dispense Refill Last Dose  . acetaminophen (TYLENOL) 500 MG tablet Take 1,000 mg by mouth  2 (two) times daily.    07/25/2015 at Unknown time  . albuterol (PROVENTIL HFA;VENTOLIN HFA) 108 (90 Base) MCG/ACT inhaler Inhale 1-2 puffs into the lungs every 6 (six) hours as needed for wheezing or shortness of breath.   prn  . aspirin 81 MG chewable tablet Chew 1 tablet (81 mg total) by mouth daily. 30 tablet 6 08/12/2015 at 0730  . Biotin 1 MG CAPS Take 1 mg by mouth daily.    08/12/2015 at Unknown time  . bisacodyl (DULCOLAX) 5 MG EC tablet Take 10 mg by mouth daily as needed for moderate constipation.    prn  . calcium acetate, Phos Binder, (PHOSLYRA) 667 MG/5ML SOLN Take 1,334 mg by mouth 3 (three) times daily with meals.   08/12/2015 at Unknown time  . diltiazem (TIAZAC) 120 MG 24 hr capsule Take 120 mg by mouth daily.   08/12/2015 at Unknown time  . diphenhydrAMINE (BENADRYL) 25 mg capsule Take 1 capsule (25 mg total) by mouth every 6 (six) hours as needed (rash, itching). 30 capsule 0 prn  . docusate sodium (COLACE) 100 MG capsule  Take 200 mg by mouth 2 (two) times daily as needed for mild constipation.   prn  . folic acid-vitamin b complex-vitamin c-selenium-zinc (DIALYVITE) 3 MG TABS tablet Take 1 tablet by mouth daily.   08/12/2015 at Unknown time  . furosemide (LASIX) 80 MG tablet Take 80 mg by mouth 2 (two) times daily.   08/12/2015 at Unknown time  . gabapentin (NEURONTIN) 100 MG capsule Take 100 mg by mouth at bedtime. Pt takes on Sunday, Monday, Wednesday, and Friday.   08/12/2015 at Unknown time  . gabapentin (NEURONTIN) 300 MG capsule Take 300 mg by mouth daily. Pt takes on Tuesday, Thursday, and Saturday after dialysis.   08/10/2015 at Unknown time  . Insulin Detemir (LEVEMIR) 100 UNIT/ML Pen Inject 11 Units into the skin 2 (two) times daily. 0730, 1730   08/12/2015 at Unknown time  . levofloxacin (LEVAQUIN) 500 MG tablet Take 1 tablet (500 mg total) by mouth every other day. 3 tablet 0   . lidocaine-prilocaine (EMLA) cream Apply 1 application topically as needed (prior to accessing port).     prn  . nystatin (MYCOSTATIN/NYSTOP) 100000 UNIT/GM POWD Apply topically 2 (two) times daily as needed.   prn  . Omega-3 Fatty Acids (FISH OIL CONCENTRATE) 300 MG CAPS Take 1 capsule by mouth daily.   08/12/2015 at Unknown time  . PARoxetine (PAXIL) 40 MG tablet Take 40 mg by mouth at bedtime.    08/12/2015 at Unknown time  . pravastatin (PRAVACHOL) 20 MG tablet Take 1 tablet (20 mg total) by mouth daily. 30 tablet 5 08/12/2015 at Unknown time  . ranitidine (ZANTAC) 150 MG tablet Take 150 mg by mouth 2 (two) times daily.    08/12/2015 at Unknown time  . senna-docusate (SENOKOT-S) 8.6-50 MG tablet Take 1 tablet by mouth 2 (two) times daily as needed.    prn  . traMADol (ULTRAM) 50 MG tablet Take 1 tablet (50 mg total) by mouth every 6 (six) hours as needed for moderate pain. 30 tablet 0 prn  . warfarin (COUMADIN) 3 MG tablet Take 1.5 mg by mouth daily. Everyday except Mondays   08/11/2015 at 2200    Assessment: Pharmacy consulted to dose heparin in this 82 year old female admitted with Afib.  Pt on warfarin at home but INR currently subtherapeutic.   Will not bolus this pt.  CrCl = 14.7 ml/min    Goal of Therapy:  Heparin level 0.3-0.7 units/ml Monitor platelets by anticoagulation protocol: Yes   Plan:  Heparin level within goal range. Will continue current heparin gtt rate and will recheck heparin level @ 19:00.   4/17 at 19:04 HL = 0.24.  Will bolus with 1000 units and increase drip rate to 1550 units/hr.   Next HL at 03:00.  Stormy CardKatsoudas,Samanda Buske K, RPh Clinical Pharmacist 09/16/2015,7:48 PM

## 2015-09-16 NOTE — Progress Notes (Signed)
Subjective:   Daughter and son at bedside.  Angiogram for later today. Right lower extremity pain, cyanosis  Objective:  Vital signs in last 24 hours:  Temp:  [97.9 F (36.6 C)-98.1 F (36.7 C)] 97.9 F (36.6 C) (04/17 0602) Pulse Rate:  [98-111] 98 (04/17 0602) Resp:  [20-24] 20 (04/17 0602) BP: (112-148)/(51-55) 112/51 mmHg (04/17 0602) SpO2:  [91 %-94 %] 94 % (04/17 0602)  Weight change:  Filed Weights   09/14/15 1352 09/14/15 2251 09/15/15 0400  Weight: 72.576 kg (160 lb) 72.757 kg (160 lb 6.4 oz) 74.662 kg (164 lb 9.6 oz)    Intake/Output:    Intake/Output Summary (Last 24 hours) at 09/16/15 1139 Last data filed at 09/16/15 1043  Gross per 24 hour  Intake    480 ml  Output      0 ml  Net    480 ml     Physical Exam: General: No acute distress, laying in the bed  HEENT Anicteric, moist oral mucous membranes  Neck supple  Pulm/lungs Mild scattered rhonchi, otherwise clear  CVS/Heart Irregular, atrial fibrillation  Abdomen:  Soft, nontender, nondistended  Extremities: Nonhealing ulcers over heels and toes, right leg, cyanotic discoloration  Neurologic: Alert, oriented  Skin: No rashes, cyanotic discoloration of right foot  Access: Right upper arm AV fistula       Basic Metabolic Panel:   Recent Labs Lab 09/14/15 1802 09/15/15 0549  NA 130* 129*  K 3.0* 4.3  CL 95* 97*  CO2 22 19*  GLUCOSE 216* 242*  BUN 34* 42*  CREATININE 3.10* 3.75*  CALCIUM 8.0* 7.7*     CBC:  Recent Labs Lab 09/14/15 1802 09/15/15 0549  WBC 9.9 12.6*  NEUTROABS 7.9*  --   HGB 10.9* 10.4*  HCT 32.8* 31.2*  MCV 93.3 93.9  PLT 250 243      Microbiology:  No results found for this or any previous visit (from the past 720 hour(s)).  Coagulation Studies:  Recent Labs  09/14/15 1802 09/15/15 0549 09/16/15 0002  LABPROT 21.6* 20.9* 24.7*  INR 1.89 1.81 2.26    Urinalysis: No results for input(s): COLORURINE, LABSPEC, PHURINE, GLUCOSEU, HGBUR,  BILIRUBINUR, KETONESUR, PROTEINUR, UROBILINOGEN, NITRITE, LEUKOCYTESUR in the last 72 hours.  Invalid input(s): APPERANCEUR    Imaging: Ct Angio Ao+bifem W/cm &/or Wo/cm  09/14/2015  CLINICAL DATA:  Right leg pain. EXAM: CT ANGIOGRAPHY AOBIFEM WITHOUT AND WITH CONTRAST TECHNIQUE: CT angiography of the aorta and lower extremity arteries was performed with reformats generated. CONTRAST:  125 cc Isovue 370 intravenous COMPARISON:  Chest CT 12/12/2014 FINDINGS: Replaced right hepatic artery to the SMA. Accessory right renal artery. Otherwise, aortic branching is standard. There is diffuse atherosclerotic calcification of the aorta and visceral branches. No major branch occlusion is seen. Negative for aneurysm. No inflow disease to explain leg symptoms. Right leg: Atheromatous but widely patent right common femoral and superficial femoral arteries. Occluded popliteal artery at the level of the knee with well-developed collaterals providing prompt and symmetric reconstituted collateral flow. On reformats, this appears to be an eccentric luminal lesion with defined margins and internal calcification, possible remote embolus. Symmetrically timed runoff that is 3 vessel at the ankle. Left leg: Diffusely atheromatous but widely patent left common femoral and superficial femoral arteries. The left profunda femoris is widely patent. Mild to moderate popliteal narrowing on the left. Three-vessel runoff at the ankle. Lower chest and abdominal wall: Cardiomegaly. Septal thickening at the bases with trace effusions, loculated complex appearing on the  left with subpleural scarring or atelectasis. This is likely developing round atelectasis given 2016 chest CT appearance 8 mm nodule over the right diaphragm is stable since comparison CT. Hepatobiliary: No focal liver abnormality.No evidence of biliary obstruction or stone. Pancreas: Unremarkable. Spleen: Sub cm low-density in the medial spleen is nonspecific. Adrenals/Urinary  Tract: Negative adrenals. Marked lack of enhancement of the kidneys in this patient with end-stage renal disease. 14 mm right upper pole calculus. No hydronephrosis or ureteral calculus. Bladder is obscured by streak artifact from hip prostheses. Reproductive:Negative Stomach/Bowel:  No obstruction. No appendicitis. Vascular/Lymphatic: No acute vascular abnormality. No mass or adenopathy. Peritoneal: No ascites or pneumoperitoneum. Musculoskeletal: L2-3 endplate erosions, sclerosis, and surrounding paraspinal inflammation. No indication of epidural collection or high-grade stenosis. There is mineralization at the level of the disc. Marked osteopenia.  Baker cyst on the left. Review of the MIP images confirms the above findings. IMPRESSION: 1. Occluded or critically stenotic right popliteal artery, chronic appearing given the degree of collateral formation with symmetric three-vessel runoff. The stenotic lesion has an eccentric luminal based appearance and may have been embolic. 2. Negative for inflow disease. 3. L2-3 discitis versus dialysis related spondyloarthropathy, associated disc calcification favoring the latter. 4. Possible mild pulmonary edema. 5. Chronic small left pleural effusion and scarring. 6. 8 mm right lower lobe pulmonary nodule but is stable from 12/12/2014. 1 year follow-up CT could document stability (assuming patient is a treatment candidate). Electronically Signed   By: Marnee Spring M.D.   On: 09/14/2015 20:36   US Venous Img Lower Unilateral Right  09/14/2015  CLINICAL DATA:  82 year old female with a history of leg pain EXAM: RIGHT LOWER EXTREMITY VENOUS DOPPLER ULTRASOUND TECHNIQUE: Gray-scale sonography with graded compression, as well as color Doppler and duplex ultrasound were performed to evaluate the lower extremity deep venous systems from the level of the common femoral vein and including the common femoral, femoral, profunda femoral, popliteal and calf veins including the  posterior tibial, peroneal and gastrocnemius veins when visible. The superficial great saphenous vein was also interrogated. Spectral Doppler was utilized to evaluate flow at rest and with distal augmentation maneuvers in the common femoral, femoral and popliteal veins. COMPARISON:  None. FINDINGS: Contralateral Common Femoral Vein: Respiratory phasicity is normal and symmetric with the symptomatic side. No evidence of thrombus. Normal compressibility. Common Femoral Vein: No evidence of thrombus. Normal compressibility, respiratory phasicity and response to augmentation. Saphenofemoral Junction: No evidence of thrombus. Normal compressibility and flow on color Doppler imaging. Profunda Femoral Vein: No evidence of thrombus. Normal compressibility and flow on color Doppler imaging. Femoral Vein: No evidence of thrombus. Normal compressibility, respiratory phasicity and response to augmentation. Popliteal Vein: No evidence of thrombus. Normal compressibility, respiratory phasicity and response to augmentation. Calf Veins: No evidence of thrombus. Normal compressibility and flow on color Doppler imaging. Superficial Great Saphenous Vein: No evidence of thrombus. Normal compressibility and flow on color Doppler imaging. Other Findings:  None. IMPRESSION: Sonographic survey of the right lower extremity negative for DVT. Signed, Yvone Neu. Loreta Ave, DO [facility] Electronically Signed   By: Gilmer Mor D.O.   On: 09/14/2015 15:07     Medications:   . heparin 1,450 Units/hr (09/16/15 0053)   . aspirin  81 mg Oral Daily  . b complex-vitamin c-folic acid  1 tablet Oral Daily  . calcium acetate  1,334 mg Oral TID WC  . diltiazem  120 mg Oral Daily  . famotidine (PEPCID) IV  20  mg Intravenous Daily  . furosemide  80 mg Oral BID  . gabapentin  100 mg Oral Once per day on Sun Mon Wed Fri  . [START ON 09/17/2015] gabapentin  300 mg Oral Once per day on Tue  Thu Sat  . insulin aspart  0-9 Units Subcutaneous TID WC  . insulin detemir  11 Units Subcutaneous BID  . PARoxetine  40 mg Oral QHS  . pravastatin  20 mg Oral Daily  . sodium chloride flush  3 mL Intravenous Q12H   acetaminophen **OR** acetaminophen, albuterol, bisacodyl, diphenhydrAMINE, docusate sodium, morphine injection, ondansetron **OR** ondansetron (ZOFRAN) IV, senna-docusate, traMADol  Assessment/ Plan:  82 y.o. female with past medical history of atrial fibrillation on Coumadin, hypertension, gout, insulin-dependent diabetes mellitus type 2, diabetic neuropathy, CVA with right visual field blindness, hyperlipidemia, major depressive disorder, overactive bladder with incontinence, generalized anxiety disorder, congestive heart failure diastolic, parathyroidectomy, and osteoarthritis, s/p left CEA 07/26/15. Patient has never smoked.  [facility]   1. ESRD on HD TTHS: Dialysis for tomorrow.   2. Anemia chronic kidney disease. - hold epo due to ischemic limb  3. Secondary hyperparathyroidism.  - continue calcium acetate (currently on PhosLo until pharmacy can get Phoslyra in stock)  4. Right lower extremity ischemia: as per Vascular surgery.  - heparin gtt.   5. Hypertension: at goal - diltiazem.    LOS: 2 Jaquia Benedicto 4/17/201711:39 AM

## 2015-09-16 NOTE — Op Note (Signed)
[facility] Percutaneous Study/Intervention Procedural Note   Date of Surgery:  09/16/2015  Surgeon(s):Tayler Lassen   Assistants:none  Pre-operative Diagnosis: PAD with likely embolus to the right leg and with rest pain right lower extremity  Post-operative diagnosis: Same  Procedure(s) Performed: 1. Ultrasound guidance for vascular access left femoral artery 2. Catheter placement into right anterior tibial artery from left femoral approach 3. Aortogram and selective right lower extremity angiogram 4. Catheter directed thrombolysis with 4 mg of TPA in the right popliteal artery 5. Mechanical thrombectomy of the right popliteal artery and right anterior tibial artery using the CAT 6 Penumbra device  6.  Percutaneous transluminal angioplasty of the right anterior tibial artery with 3 mm diameter by 15 cm length angioplasty balloon 7. StarClose closure device left femoral artery  EBL: 50 cc  Contrast: 70 cc  Fluoro Time: 10.4 minutes  Moderate Conscious Sedation Time: approximately 60 minutes using 2 mg of Versed and 100 mcg of Fentanyl  Indications: Patient is a 82 y.o.female with ischemic rest pain of the right leg for approximately 1 week. She has atrial thrombus medical issues including severely reduced ejection fraction and congestive heart failure. The patient has a CT scan which showed a popliteal occlusion which the radiologist read out is chronic but more likely was acute based off her clinical history and my interpretation of the CT scan. This was a limited CT scan due to her disease. The patient is brought in for angiography for further evaluation and potential treatment. Risks and benefits are discussed and informed consent is obtained  Procedure: The patient was identified and appropriate procedural time out was performed. The patient was then placed supine  on the table and prepped and draped in the usual sterile fashion.Moderate conscious sedation was administered during a face to face encounter with the patient throughout the procedure with my supervision of the RN administering medicines and monitoring the patient's vital signs, pulse oximetry, telemetry and mental status throughout from the start of the procedure until the patient was taken to the recovery room. Ultrasound was used to evaluate the left common femoral artery. It was patent . A digital ultrasound image was acquired. A Seldinger needle was used to access the left common femoral artery under direct ultrasound guidance and a permanent image was performed. A 0.035 J wire was advanced without resistance and a 5Fr sheath was placed. Pigtail catheter was placed into the aorta and an AP aortogram was performed. This demonstrated normal renal arteries and normal aorta and iliac segments without significant stenosis. Her ejection fraction was obviously reduced on imaging with very slow transit time. I then crossed the aortic bifurcation and advanced to the right femoral head. Selective right lower extremity angiogram was then performed. This demonstrated clearly embolic occlusion of the popliteal artery at the level a with thrombosis of the tibial vessels. Distal reconstitution was faint initially. Mild atherosclerotic disease otherwise in the right lower extremity. The patient was systemically heparinized and a 6 French sheath was then placed over the [employer] wire. I then used a Kumpe catheter and the advantage wire to navigate down to the popliteal artery. 4 mg of TPA was then instilled in the thrombosed popliteal artery and initially the tibioperoneal trunk. This was allowed to dwell for several minutes and then I used the Penumbra CAT 6 device to perform mechanical thrombectomy of the right popliteal artery. I placed a 0.018 wire down and got into the anterior tibial artery and advanced the  device  into the anterior tibial artery and perform mechanical thrombectomy and that as well. This resulted in removal of the thrombus from the original popliteal embolus down into the tibial trifurcation area. More mechanical thrombectomy was then performed with deep number device at the distal popliteal artery and proximal anterior tibial artery. A 3 mm diameter by 15 cm length angioplasty balloon was then used to treat the proximal anterior tibial artery up through the distal popliteal artery. This resulted in improvement but not resolution of the thrombus. Another attempt with the penumbra device was made and on this turn we removed the wire and were able to cork the thrombus/embolus and pull it into the sheath. This sheath was then removed over a wire with negative pressure used throughout the procedure of removal. When a 6 French sheath was replaced, a small amount of thrombus was seen in the femoral bifurcation and proximal SFA but this was not occlusive on the left. A Kumpe catheter was then placed back into the right popliteal artery and there was complete removal of the thrombus with three-vessel runoff to the foot on the right. I did not feel that treating the nonocclusive thrombus in the proximal left SFA was necessary at this time and we will keep her anticoagulated and follow her clinically. Her right leg perfusion had been normalized. I elected to terminate the procedure. The sheath was removed and StarClose closure device was deployed in the left femoral artery with excellent hemostatic result. The patient was taken to the recovery room in stable condition having tolerated the procedure well.  Findings:  Aortogram: Normal renal arteries, normal aorta and iliac segments without significant disease Right Lower Extremity: Clearly embolic occlusion of the popliteal artery at the level a with thrombosis of the tibial vessels. Distal reconstitution was faint initially. Mild  atherosclerotic disease otherwise in the right lower extremity.   Disposition: Patient was taken to the recovery room in stable condition having tolerated the procedure well.  Complications: None  Lestine Rahe 09/16/2015 5:36 PM

## 2015-09-16 NOTE — Consult Note (Signed)
WOC wound consult note Reason for Consult: Full thickness ulcer to right posterior lower leg and unstageable pressure injury to right heel.  Present on admission. Yes Wound type: Unstageable pressure injury to right posterior leg and right heel Pressure Ulcer POA: Yes   [facility]  Measurement:Right posterior leg 3 cm x 2.2 cm unable to visualize wound bed Right heel 2 cm x 1 cm unable to visualize wound bed, dry and necrotic Wound bed:Dry and necrotic Drainage (amount, consistency, odor) None Periwound:Dry skin Dressing procedure/placement/frequency:Cleanse wounds to right heel and right posterior leg.  Apply Santyl ointment to wound bed.  Cover with NS moist gauze to facilitate debridement.  Cover with dry 4 x4 gauze and kerlix/tape.  Change daily.  Send ointment home with patient on discharge.  Will not follow at this time.  Please re-consult if needed.  Maple HudsonKaren Anurag Scarfo RN BSN [facility]ON Pager [phone]

## 2015-09-16 NOTE — Progress Notes (Signed)
Pt clinically stable post procedure, vss, no bleeding nor hematoma at left groin site, pad pressure device on left groin with 40ml air placed, til in am to be removed, pt remains on bedrest at this time, report called to Trinna Postanya Rn on 2c with plan and orders reviewed. Daughter present with patient, restarted heparin gtt per orders.

## 2015-09-16 NOTE — Progress Notes (Signed)
Pt Diet orders not resumed after angiogram.Primary nurse paged and spoke to  Dr. Anne HahnWillis. Dr. Anne HahnWillis notified and orders received to resume diet orders.

## 2015-09-16 NOTE — Progress Notes (Signed)
ANTICOAGULATION CONSULT NOTE - FOLLOW UP   Pharmacy Consult for Heparin  Indication: atrial fibrillation  Allergies  Allergen Reactions  . Codeine Other (See Comments)    Reaction:  Confusion/Hallucinations   . Contrast Media [Iodinated Diagnostic Agents] Other (See Comments)    "skin peel"  . Oxycodone Other (See Comments)    Reaction:  Confusion/Hallucinations   . Quinine Derivatives Rash    Patient Measurements: Height: 5\' 5"  (165.1 cm) Weight: 164 lb 9.6 oz (74.662 kg) IBW/kg (Calculated) : 57 Heparin Dosing Weight: 71.6 kg   Vital Signs: Temp: 98.3 F (36.8 C) (04/17 1849) Temp Source: Oral (04/17 1544) BP: 121/50 mmHg (04/17 1849) Pulse Rate: 72 (04/17 1849)  Labs:  Recent Labs  09/14/15 1802 09/15/15 0549  09/16/15 0002 09/16/15 0937 09/16/15 1904  HGB 10.9* 10.4*  --   --   --   --   HCT 32.8* 31.2*  --   --   --   --   PLT 250 243  --   --   --   --   LABPROT 21.6* 20.9*  --  24.7*  --   --   INR 1.89 1.81  --  2.26  --   --   HEPARINUNFRC  --  0.17*  < > 0.26* 0.51 0.24*  CREATININE 3.10* 3.75*  --   --   --   --   < > = values in this interval not displayed.  Estimated Creatinine Clearance: 12.3 mL/min (by C-G formula based on Cr of 3.75).  Assessment: Pharmacy consulted to dose heparin in this 82 year old female admitted with Afib.   Heparin level subtherapeutic, however patient had vascular procedure this afternoon. Per RN note, heparin resumed around 18:15. Unclear how long it was stopped for procedure.  Goal of Therapy:  Heparin level 0.3-0.7 units/ml Monitor platelets by anticoagulation protocol: Yes   Plan:  Will continue current rate at this time as unclear how long heparin running before level. Will recheck level 8 hours after heparin resumed (per RN note @ ~18:15)   Garlon HatchetJody Tyrhonda Georgiades, PharmD Clinical Pharmacist  09/16/2015 7:59 PM

## 2015-09-16 NOTE — H&P (Signed)
  [facility] History & Physical Update  The patient was interviewed and re-examined.  The patient's previous History and Physical has been reviewed and is unchanged.  There is no change in the plan of care. We plan to proceed with the scheduled procedure.  DEW,JASON, MD  09/16/2015, 2:21 PM

## 2015-09-16 NOTE — Progress Notes (Signed)
ANTICOAGULATION CONSULT NOTE - Initial Consult  Pharmacy Consult for Heparin  Indication: atrial fibrillation  Allergies  Allergen Reactions  . Codeine Other (See Comments)    Reaction:  Confusion/Hallucinations   . Contrast Media [Iodinated Diagnostic Agents] Other (See Comments)    "skin peel"  . Oxycodone Other (See Comments)    Reaction:  Confusion/Hallucinations   . Quinine Derivatives Rash    Patient Measurements: Height: 5\' 5"  (165.1 cm) Weight: 164 lb 9.6 oz (74.662 kg) IBW/kg (Calculated) : 57 Heparin Dosing Weight: 71.6 kg   Vital Signs: Temp: 98.1 F (36.7 C) (04/16 2056) Temp Source: Oral (04/16 2056) BP: 148/55 mmHg (04/16 2056) Pulse Rate: 111 (04/16 2056)  Labs:  Recent Labs  09/14/15 1802 09/15/15 0549 09/15/15 1549 09/16/15 0002  HGB 10.9* 10.4*  --   --   HCT 32.8* 31.2*  --   --   PLT 250 243  --   --   LABPROT 21.6* 20.9*  --   --   INR 1.89 1.81  --   --   HEPARINUNFRC  --  0.17* 0.37 0.26*  CREATININE 3.10* 3.75*  --   --     Estimated Creatinine Clearance: 12.3 mL/min (by C-G formula based on Cr of 3.75).   Medical History: Past Medical History  Diagnosis Date  . Chronic kidney disease   . Dysrhythmia   . Diabetes mellitus without complication (HCC)   . Hypertension   . Stroke (HCC)   . Arthritis     gout  . Dialysis patient (HCC)   . Pleural effusion   . Pulmonary hypertension (HCC)   . Renal insufficiency   . CHF (congestive heart failure) (HCC)   . Peripheral vascular disease (HCC)   . Shortness of breath dyspnea     with exertion  . GERD (gastroesophageal reflux disease)   . Anemia   . Restless leg syndrome   . Atrial fibrillation (HCC)     Medications:  Prescriptions prior to admission  Medication Sig Dispense Refill Last Dose  . acetaminophen (TYLENOL) 500 MG tablet Take 1,000 mg by mouth 2 (two) times daily.    07/25/2015 at Unknown time  . albuterol (PROVENTIL HFA;VENTOLIN HFA) 108 (90 Base) MCG/ACT inhaler  Inhale 1-2 puffs into the lungs every 6 (six) hours as needed for wheezing or shortness of breath.   prn  . aspirin 81 MG chewable tablet Chew 1 tablet (81 mg total) by mouth daily. 30 tablet 6 08/12/2015 at 0730  . Biotin 1 MG CAPS Take 1 mg by mouth daily.    08/12/2015 at Unknown time  . bisacodyl (DULCOLAX) 5 MG EC tablet Take 10 mg by mouth daily as needed for moderate constipation.    prn  . calcium acetate, Phos Binder, (PHOSLYRA) 667 MG/5ML SOLN Take 1,334 mg by mouth 3 (three) times daily with meals.   08/12/2015 at Unknown time  . diltiazem (TIAZAC) 120 MG 24 hr capsule Take 120 mg by mouth daily.   08/12/2015 at Unknown time  . diphenhydrAMINE (BENADRYL) 25 mg capsule Take 1 capsule (25 mg total) by mouth every 6 (six) hours as needed (rash, itching). 30 capsule 0 prn  . docusate sodium (COLACE) 100 MG capsule Take 200 mg by mouth 2 (two) times daily as needed for mild constipation.   prn  . folic acid-vitamin b complex-vitamin c-selenium-zinc (DIALYVITE) 3 MG TABS tablet Take 1 tablet by mouth daily.   08/12/2015 at Unknown time  . furosemide (LASIX) 80 MG tablet  Take 80 mg by mouth 2 (two) times daily.   08/12/2015 at Unknown time  . gabapentin (NEURONTIN) 100 MG capsule Take 100 mg by mouth at bedtime. Pt takes on Sunday, Monday, Wednesday, and Friday.   08/12/2015 at Unknown time  . gabapentin (NEURONTIN) 300 MG capsule Take 300 mg by mouth daily. Pt takes on Tuesday, Thursday, and Saturday after dialysis.   08/10/2015 at Unknown time  . Insulin Detemir (LEVEMIR) 100 UNIT/ML Pen Inject 11 Units into the skin 2 (two) times daily. 0730, 1730   08/12/2015 at Unknown time  . levofloxacin (LEVAQUIN) 500 MG tablet Take 1 tablet (500 mg total) by mouth every other day. 3 tablet 0   . lidocaine-prilocaine (EMLA) cream Apply 1 application topically as needed (prior to accessing port).    prn  . nystatin (MYCOSTATIN/NYSTOP) 100000 UNIT/GM POWD Apply topically 2 (two) times daily as needed.   prn  .  Omega-3 Fatty Acids (FISH OIL CONCENTRATE) 300 MG CAPS Take 1 capsule by mouth daily.   08/12/2015 at Unknown time  . PARoxetine (PAXIL) 40 MG tablet Take 40 mg by mouth at bedtime.    08/12/2015 at Unknown time  . pravastatin (PRAVACHOL) 20 MG tablet Take 1 tablet (20 mg total) by mouth daily. 30 tablet 5 08/12/2015 at Unknown time  . ranitidine (ZANTAC) 150 MG tablet Take 150 mg by mouth 2 (two) times daily.    08/12/2015 at Unknown time  . senna-docusate (SENOKOT-S) 8.6-50 MG tablet Take 1 tablet by mouth 2 (two) times daily as needed.    prn  . traMADol (ULTRAM) 50 MG tablet Take 1 tablet (50 mg total) by mouth every 6 (six) hours as needed for moderate pain. 30 tablet 0 prn  . warfarin (COUMADIN) 3 MG tablet Take 1.5 mg by mouth daily. Everyday except Mondays   08/11/2015 at 2200    Assessment: Pharmacy consulted to dose heparin in this 82 year old female admitted with Afib.  Pt on warfarin at home but INR currently subtherapeutic.   4/15 : INR = 1.89  Will not bolus this pt.  CrCl = 14.7 ml/min  Goal of Therapy:  Heparin level 0.3-0.7 units/ml Monitor platelets by anticoagulation protocol: Yes   Plan:  Heparin level subtherapeutic. 1100 units IV x 1 bolus and increase rate to 1450 units/hr. Will recheck level in 8 hours.  [name], Pharm.D., BCPS Clinical Pharmacist 09/16/2015,12:46 AM

## 2015-09-16 NOTE — Care Management Important Message (Signed)
Important Message  Patient Details  Name: [name] MRN: [id number] Date of Birth: 1933/12/22   Medicare Important Message Given:  Yes    Olegario MessierKathy A Krystin Keeven 09/16/2015, 1:38 PM

## 2015-09-16 NOTE — Progress Notes (Addendum)
ANTICOAGULATION CONSULT NOTE - Initial Consult  Pharmacy Consult for warfarin Indication: atrial fibrilltion  Allergies  Allergen Reactions  . Codeine Other (See Comments)    Reaction:  Confusion/Hallucinations   . Contrast Media [Iodinated Diagnostic Agents] Other (See Comments)    "skin peel"  . Oxycodone Other (See Comments)    Reaction:  Confusion/Hallucinations   . Quinine Derivatives Rash    Patient Measurements: Height: 5\' 5"  (165.1 cm) Weight: 164 lb 9.6 oz (74.662 kg) IBW/kg (Calculated) : 57 Heparin Dosing Weight:   Vital Signs: Temp: 97.9 F (36.6 C) (04/17 0602) Temp Source: Oral (04/17 0602) BP: 112/51 mmHg (04/17 0602) Pulse Rate: 98 (04/17 0602)  Labs:  Recent Labs  09/14/15 1802  09/15/15 0549 09/15/15 1549 09/16/15 0002 09/16/15 0937  HGB 10.9*  --  10.4*  --   --   --   HCT 32.8*  --  31.2*  --   --   --   PLT 250  --  243  --   --   --   LABPROT 21.6*  --  20.9*  --  24.7*  --   INR 1.89  --  1.81  --  2.26  --   HEPARINUNFRC  --   < > 0.17* 0.37 0.26* 0.51  CREATININE 3.10*  --  3.75*  --   --   --   < > = values in this interval not displayed.  Estimated Creatinine Clearance: 12.3 mL/min (by C-G formula based on Cr of 3.75).   Medical History: Past Medical History  Diagnosis Date  . Chronic kidney disease   . Dysrhythmia   . Diabetes mellitus without complication (HCC)   . Hypertension   . Stroke (HCC)   . Arthritis     gout  . Dialysis patient (HCC)   . Pleural effusion   . Pulmonary hypertension (HCC)   . Renal insufficiency   . CHF (congestive heart failure) (HCC)   . Peripheral vascular disease (HCC)   . Shortness of breath dyspnea     with exertion  . GERD (gastroesophageal reflux disease)   . Anemia   . Restless leg syndrome   . Atrial fibrillation (HCC)     Medications:    Assessment: Per MD note:  Coumadin on Hold -Seen by vascular. Patient will undergo vascular procedure today for right lower extremity  blockage around the popliteal area which appears chronic.  Per consult details, restart warfarin upon discontinuation of heparin gtt.   Goal of Therapy:  INR; 2-3  Monitor platelets by anticoagulation protocol: Yes   Plan:  Will continue to hold warfarin therapy based on information provided in comments section of consult and per MD note. Pharmacy to follow and restart warfarin when necessary.  Chesky Heyer D 09/16/2015,1:59 PM

## 2015-09-17 ENCOUNTER — Encounter: Payer: Self-pay | Admitting: Vascular Surgery

## 2015-09-17 LAB — GLUCOSE, CAPILLARY
GLUCOSE-CAPILLARY: 273 mg/dL — AB (ref 65–99)
Glucose-Capillary: 96 mg/dL (ref 65–99)
Glucose-Capillary: 107 mg/dL — ABNORMAL HIGH (ref 65–99)
Glucose-Capillary: 150 mg/dL — ABNORMAL HIGH (ref 65–99)
Glucose-Capillary: 236 mg/dL — ABNORMAL HIGH (ref 65–99)

## 2015-09-17 LAB — RENAL FUNCTION PANEL
ANION GAP: 15 (ref 5–15)
Albumin: 2.7 g/dL — ABNORMAL LOW (ref 3.5–5.0)
BUN: 72 mg/dL — ABNORMAL HIGH (ref 6–20)
CALCIUM: 7.8 mg/dL — AB (ref 8.9–10.3)
CO2: 16 mmol/L — AB (ref 22–32)
CREATININE: 5.94 mg/dL — AB (ref 0.44–1.00)
Chloride: 98 mmol/L — ABNORMAL LOW (ref 101–111)
GFR calc Af Amer: 7 mL/min — ABNORMAL LOW (ref >60)
GFR calc non Af Amer: 6 mL/min — ABNORMAL LOW (ref >60)
GLUCOSE: 105 mg/dL — AB (ref 65–99)
Phosphorus: 9.1 mg/dL — ABNORMAL HIGH (ref 2.5–4.6)
Potassium: 6.1 mmol/L — ABNORMAL HIGH (ref 3.5–5.1)
SODIUM: 129 mmol/L — AB (ref 135–145)

## 2015-09-17 LAB — CBC
HCT: 29.2 % — ABNORMAL LOW (ref 35.0–47.0)
Hemoglobin: 9.5 g/dL — ABNORMAL LOW (ref 12.0–16.0)
MCH: 30.1 pg (ref 26.0–34.0)
MCHC: 32.7 g/dL (ref 32.0–36.0)
MCV: 92.2 fL (ref 80.0–100.0)
Platelets: 239 10*3/uL (ref 150–440)
RBC: 3.17 MIL/uL — ABNORMAL LOW (ref 3.80–5.20)
RDW: 17 % — AB (ref 11.5–14.5)
WBC: 17 10*3/uL — AB (ref 3.6–11.0)

## 2015-09-17 LAB — PROTIME-INR
INR: 2.68
Prothrombin Time: 28.1 seconds — ABNORMAL HIGH (ref 11.4–15.0)

## 2015-09-17 LAB — HEPARIN LEVEL (UNFRACTIONATED): HEPARIN UNFRACTIONATED: 0.39 [IU]/mL (ref 0.30–0.70)

## 2015-09-17 MED ORDER — WARFARIN - PHARMACIST DOSING INPATIENT
Freq: Every day | Status: DC
  Administered 2015-09-17: 18:00:00

## 2015-09-17 MED ORDER — WARFARIN SODIUM 1 MG PO TABS
1.5000 mg | ORAL_TABLET | Freq: Every day | ORAL | Status: DC
  Administered 2015-09-17: 1.5 mg via ORAL
  Filled 2015-09-17: qty 2
  Filled 2015-09-17: qty 1

## 2015-09-17 NOTE — Care Management (Signed)
Patient to discharge home today with home health services.  Discharge orders in place.  At 1630 notified by PT that patient would require SNF.  I have called MD and updated him on the recommendation.  Patient and daughter want to stay to be worked up for SNF on 09/18/15.  I have informed both that insurance may not pay the over night stay due discharge order being present since 0935.  CSW notifed.

## 2015-09-17 NOTE — Progress Notes (Signed)
Patient ID: [name], female   DOB: 24-May-1934, 82 y.o.   MR[facility]: [id number] [facility] [place] [facility]   PATIENT NAME: Natalie LeighJoann [name]    MR#:  [id number]  DATE OF BIRTH:  24-May-1934  SUBJECTIVE:  Comes in with increasing discoloration of the right foot along with significant amount of pain. S/p RLE angio with thrombectomy. Pain is resolved Had dialysis earlier today.  PT is recommending SNF. Likely d/c in AM  REVIEW OF SYSTEMS:   Review of Systems  Constitutional: Negative for fever, chills and weight loss.  HENT: Negative for ear discharge, ear pain and nosebleeds.   Eyes: Negative for blurred vision, pain and discharge.  Respiratory: Negative for sputum production, shortness of breath, wheezing and stridor.   Cardiovascular: Negative for chest pain, palpitations, orthopnea and PND.  Gastrointestinal: Negative for nausea, vomiting, abdominal pain and diarrhea.  Genitourinary: Negative for urgency and frequency.  Musculoskeletal: Positive for joint pain. Negative for back pain.       Foot pain bilaterally more on the right than left  Neurological: Negative for sensory change, speech change, focal weakness and weakness.  Psychiatric/Behavioral: Negative for depression and hallucinations. The patient is not nervous/anxious.   All other systems reviewed and are negative.   DRUG ALLERGIES:   Allergies  Allergen Reactions  . Codeine Other (See Comments)    Reaction:  Confusion/Hallucinations   . Contrast Media [Iodinated Diagnostic Agents] Other (See Comments)    "skin peel"  . Oxycodone Other (See Comments)    Reaction:  Confusion/Hallucinations   . Quinine Derivatives Rash    VITALS:  Blood pressure 117/50, pulse 75, temperature 98 F (36.7 C), temperature source Oral, resp. rate 16, height 5\' 5"  (1.651 m), weight 72.9 kg (160 lb 11.5 oz), SpO2 95 %.  PHYSICAL EXAMINATION:   Physical Exam  GENERAL:  82 y.o.-year-old patient lying  in the bed with no acute distress.  EYES: Pupils equal, round, reactive to light and accommodation. No scleral icterus. Extraocular muscles intact.  HEENT: Head atraumatic, normocephalic. Oropharynx and nasopharynx clear.  NECK:  Supple, no jugular venous distention. No thyroid enlargement, no tenderness.  LUNGS: Normal breath sounds bilaterally, no wheezing, rales, rhonchi. No use of accessory muscles of respiration.  CARDIOVASCULAR: S1, S2 normal. No murmurs, rubs, or gallops.  ABDOMEN: Soft, nontender, nondistended. Bowel sounds present. No organomegaly or mass.  EXTREMITIES:Right foot is warmer and not tender.  NEUROLOGIC: Cranial nerves II through XII are intact. No focal Motor or sensory deficits b/l.   PSYCHIATRIC:  patient is alert and oriented x 3.  SKIN: No obvious rash, lesion, or ulcer.   LABORATORY PANEL:  CBC  Recent Labs Lab 09/17/15 0327  WBC 17.0*  HGB 9.5*  HCT 29.2*  PLT 239    Chemistries   Recent Labs Lab 09/15/15 0549 09/17/15 0327  NA 129* 129*  K 4.3 6.1*  CL 97* 98*  CO2 19* 16*  GLUCOSE 242* 105*  BUN 42* 72*  CREATININE 3.75* 5.94*  CALCIUM 7.7* 7.8*  AST 50*  --   ALT 20  --   ALKPHOS 64  --   BILITOT 0.5  --    Cardiac Enzymes No results for input(s): TROPONINI in the last 168 hours. RADIOLOGY:  No results found. ASSESSMENT AND PLAN:  [name] is a 82 y.o. female has a past medical history significant for chronic a-fib, previous CVA, DM, ESRD on HD and PVD now with acute worsening pain of RLE.  Presented to ER where RLE was tender and cold. INR subtherapeutic  * Acute on chronic peripheral vascular disease with significant claudication and rest pain right foot more than left S/p RLE angio and thrombectomy Stop heparin. Restart Coumadin, ASA. Discussed with Dr. Wyn Quaker. Pain meds PRN  * Hypertension continue home meds  * Type 2 diabetes sliding scale and insulin  * ESRD Nephrology on board for HD.  Case discussed with Care  Management/Social Worker. Management plans discussed with the patient, family and they are in agreement.  CODE STATUS: Full  DVT Prophylaxis: On coumadin  TOTAL TIME TAKING CARE OF THIS PATIENT - 35 minutes.   Discharge tomorrow to SNF  Note: This dictation was prepared with Dragon dictation along with smaller phrase technology. Any transcriptional errors that result from this process are unintentional.  Milagros Loll R M.D on 09/17/2015 at 4:52 PM  Between 7am to 6pm - Pager - [phone]  After 6pm go to [email or web address] - password [email or web address] [facility]  [facility]  Office  [phone]  CC: Primary care physician; [name], MD

## 2015-09-17 NOTE — Progress Notes (Addendum)
ANTICOAGULATION CONSULT NOTE - FOLLOW UP   Pharmacy Consult for Heparin  Indication: atrial fibrillation  Allergies  Allergen Reactions  . Codeine Other (See Comments)    Reaction:  Confusion/Hallucinations   . Contrast Media [Iodinated Diagnostic Agents] Other (See Comments)    "skin peel"  . Oxycodone Other (See Comments)    Reaction:  Confusion/Hallucinations   . Quinine Derivatives Rash    Patient Measurements: Height: 5\' 5"  (165.1 cm) Weight: 163 lb 12.8 oz (74.299 kg) IBW/kg (Calculated) : 57 Heparin Dosing Weight: 71.6 kg   Vital Signs: Temp: 97.4 F (36.3 C) (04/18 0444) Temp Source: Oral (04/18 0444) BP: 118/79 mmHg (04/18 0444) Pulse Rate: 60 (04/18 0444)  Labs:  Recent Labs  09/14/15 1802 09/15/15 0549  09/16/15 0002 09/16/15 0937 09/16/15 1904 09/17/15 0327  HGB 10.9* 10.4*  --   --   --   --  9.5*  HCT 32.8* 31.2*  --   --   --   --  29.2*  PLT 250 243  --   --   --   --  239  LABPROT 21.6* 20.9*  --  24.7*  --   --  28.1*  INR 1.89 1.81  --  2.26  --   --  2.68  HEPARINUNFRC  --  0.17*  < > 0.26* 0.51 0.24* 0.39  CREATININE 3.10* 3.75*  --   --   --   --   --   < > = values in this interval not displayed.  Estimated Creatinine Clearance: 12.3 mL/min (by C-G formula based on Cr of 3.75).  Assessment: Pharmacy consulted to dose heparin in this 82 year old female admitted with Afib.   Heparin level subtherapeutic, however patient had vascular procedure this afternoon. Per RN note, heparin resumed around 18:15. Unclear how long it was stopped for procedure.  Goal of Therapy:  Heparin level 0.3-0.7 units/ml Monitor platelets by anticoagulation protocol: Yes   Plan:  Will continue current rate at this time as unclear how long heparin running before level. Will recheck level 8 hours after heparin resumed (per RN note @ ~18:15)  4/18 03:30 AM heparin level 0.39, INR 2.68. Continue current regimen and recheck in 8 hours to confirm.   Garlon HatchetJody Barefoot,  PharmD Clinical Pharmacist  09/17/2015 6:49 AM

## 2015-09-17 NOTE — Evaluation (Signed)
Physical Therapy Evaluation Patient Details Name: [name] MRN: [id number] DOB: 11/02/1933 Today's Date: 09/17/2015   History of Present Illness  82 yo F presented to the ED with 1 wk of worsening R lower leg pain, cold and discolored found to have PVD with claudication. PMH includes CKD, DM, HTN, stroke, HTN, CHF, pulmonary HTN, PVD, dialysis pt, and L 5th toe amputation.  Clinical Impression  Pt demonstrated severe generalized weakness and difficulty with mobility after recent hospitalizations and overall decline in health the past few months. At baseline pt lives with her family and they provided min assist to min guard for bed mobility and transfers. Pt has not been walking recently. She now requires mod A for bed mobility and max A for stand pivot transfer from bed to chair. Pt is not safe to return home with assistance from family due to amount of physical assistance required for mobility at this time. STR is recommended to address deficits of strength, balance and mobility to progress towards PLOF. Pt will benefit from skilled PT services to increase functional I and mobility for safe discharge.     Follow Up Recommendations SNF    Equipment Recommendations  None recommended by PT    Recommendations for Other Services       Precautions / Restrictions Precautions Precautions: Fall Restrictions Weight Bearing Restrictions: No      Mobility  Bed Mobility Overal bed mobility: Needs Assistance Bed Mobility: Supine to Sit     Supine to sit: Mod assist;HOB elevated     General bed mobility comments: difficulty getting trunk upright  Transfers Overall transfer level: Needs assistance Equipment used: None Transfers: Sit to/from [employer] Transfers Sit to Stand: Max assist Stand pivot transfers: Max assist       General transfer comment: extreme difficulty getting in standing position due to amount of weakness, difficulty lifting and repositioning feet for  transfer  Ambulation/Gait             General Gait Details: unable  Stairs            Wheelchair Mobility    Modified Rankin (Stroke Patients Only)       Balance Overall balance assessment: Needs assistance;History of Falls Sitting-balance support: Bilateral upper extremity supported;Feet supported Sitting balance-Leahy Scale: Poor Sitting balance - Comments: poor trunk stability Postural control: Posterior lean Standing balance support: Bilateral upper extremity supported Standing balance-Leahy Scale: Zero Standing balance comment: requires max A to maintain standing                             Pertinent Vitals/Pain Pain Assessment: No/denies pain    Home Living Family/patient expects to be discharged to:: Private residence Living Arrangements: Children Available Help at Discharge: Family;Available 24 hours/day Type of Home: House Home Access: Ramped entrance     Home Layout: One level Home Equipment: Walker - 2 wheels;Transport chair      Prior Function Level of Independence: Needs assistance   Gait / Transfers Assistance Needed: Pt required min assistance for bed mobility and transfers at baseline. She has not ambulated for a while now.  ADL's / Homemaking Assistance Needed: provided by family        Hand Dominance   Dominant Hand: Right    Extremity/Trunk Assessment   Upper Extremity Assessment: Generalized weakness           Lower Extremity Assessment: Generalized weakness  Communication   Communication: No difficulties  Cognition Arousal/Alertness: Awake/alert Behavior During Therapy: WFL for tasks assessed/performed Overall Cognitive Status: Within Functional Limits for tasks assessed                      General Comments General comments (skin integrity, edema, etc.): staining of B LEs, bandages to B heels    Exercises Other Exercises Other Exercises: B LE ankle pumps, QS, GS x 10 each with  cues for technique. Pt and daughter educated on performing these exercises for HEP to facilitate circulation and strengthening of LEs.      Assessment/Plan    PT Assessment Patient needs continued PT services  PT Diagnosis Difficulty walking;Generalized weakness   PT Problem List Decreased strength;Decreased activity tolerance;Decreased balance;Decreased mobility;Decreased safety awareness;Decreased skin integrity  PT Treatment Interventions DME instruction;Gait training;Therapeutic activities;Therapeutic exercise;Balance training;Neuromuscular re-education;Patient/family education   PT Goals (Current goals can be found in the Care Plan section) Acute Rehab PT Goals Patient Stated Goal: to go home PT Goal Formulation: With patient/family Time For Goal Achievement: 10/01/15 Potential to Achieve Goals: Poor    Frequency Min 2X/week   Barriers to discharge Decreased caregiver support pt is max assistance for transfer at this time    Co-evaluation               End of Session Equipment Utilized During Treatment: Gait belt Activity Tolerance: Patient limited by fatigue Patient left: in chair;with call bell/phone within reach;with chair alarm set;with family/visitor present Nurse Communication: Mobility status         Time: [id number] PT Time Calculation (min) (ACUTE ONLY): 29 min   Charges:   PT Evaluation $PT Eval High Complexity: 1 Procedure PT Treatments $Therapeutic Exercise: 8-22 mins   PT G Codes:        Adelene IdlerMindy Jo Samariya Rockhold, PT, DPT  09/17/2015, 5:12 PM [phone]

## 2015-09-17 NOTE — Progress Notes (Signed)
PT Cancellation Note  Patient Details Name: [name] MRN: [id number] DOB: December 22, 1933   Cancelled Treatment:    Reason Eval/Treat Not Completed: Medical issues which prohibited therapy;Other (comment). Pt's chart reviewed and discussed with RN. Pt had a re-vascularization yesterday and remains on flat bed rest. PT will f/u at a later time and complete evaluation when pt is appropriate.   [name], PT, DPT  09/17/2015, 9:14 AM [phone]

## 2015-09-17 NOTE — Progress Notes (Signed)
Subjective:   Right popliteal thrombectomy by Dr. Wyn Quaker yesterday 4/17  Seen and examined on hemodialysis. Tolerating treatment well.   Objective:  Vital signs in last 24 hours:  Temp:  [97.3 F (36.3 C)-98.3 F (36.8 C)] 97.4 F (36.3 C) (04/18 0444) Pulse Rate:  [43-88] 60 (04/18 0444) Resp:  [11-18] 18 (04/18 0444) BP: (117-132)/(50-79) 118/79 mmHg (04/18 0444) SpO2:  [85 %-100 %] 100 % (04/18 0444) Weight:  [74.299 kg (163 lb 12.8 oz)] 74.299 kg (163 lb 12.8 oz) (04/18 0500)  Weight change:  Filed Weights   09/14/15 2251 09/15/15 0400 09/17/15 0500  Weight: 72.757 kg (160 lb 6.4 oz) 74.662 kg (164 lb 9.6 oz) 74.299 kg (163 lb 12.8 oz)    Intake/Output:    Intake/Output Summary (Last 24 hours) at 09/17/15 0920 Last data filed at 09/17/15 0529  Gross per 24 hour  Intake 141.26 ml  Output      0 ml  Net 141.26 ml     Physical Exam: General: No acute distress, laying in the bed  HEENT Anicteric, moist oral mucous membranes  Neck supple  Pulm/lungs Mild scattered rhonchi, otherwise clear  CVS/Heart Irregular, atrial fibrillation  Abdomen:  Soft, nontender, nondistended  Extremities: Nonhealing ulcers over heels and toes, right leg, cyanotic discoloration  Neurologic: Alert, oriented  Skin: No rashes, cyanotic discoloration of right foot  Access: Right upper arm AV fistula       Basic Metabolic Panel:   Recent Labs Lab 09/14/15 1802 09/15/15 0549  NA 130* 129*  K 3.0* 4.3  CL 95* 97*  CO2 22 19*  GLUCOSE 216* 242*  BUN 34* 42*  CREATININE 3.10* 3.75*  CALCIUM 8.0* 7.7*     CBC:  Recent Labs Lab 09/14/15 1802 09/15/15 0549 09/17/15 0327  WBC 9.9 12.6* 17.0*  NEUTROABS 7.9*  --   --   HGB 10.9* 10.4* 9.5*  HCT 32.8* 31.2* 29.2*  MCV 93.3 93.9 92.2  PLT 250 243 239      Microbiology:  No results found for this or any previous visit (from the past 720 hour(s)).  Coagulation Studies:  Recent Labs  09/14/15 1802 09/15/15 0549  09/16/15 0002 09/17/15 0327  LABPROT 21.6* 20.9* 24.7* 28.1*  INR 1.89 1.81 2.26 2.68    Urinalysis: No results for input(s): COLORURINE, LABSPEC, PHURINE, GLUCOSEU, HGBUR, BILIRUBINUR, KETONESUR, PROTEINUR, UROBILINOGEN, NITRITE, LEUKOCYTESUR in the last 72 hours.  Invalid input(s): APPERANCEUR    Imaging: No results found.   Medications:     . aspirin  81 mg Oral Daily  . b complex-vitamin c-folic acid  1 tablet Oral Daily  . calcium acetate  1,334 mg Oral TID WC  . collagenase   Topical Daily  . diltiazem  120 mg Oral Daily  . famotidine (PEPCID) IV  20 mg Intravenous Daily  . furosemide  80 mg Oral BID  . gabapentin  100 mg Oral Once per day on Sun Mon Wed Fri  . gabapentin  300 mg Oral Once per day on Tue Thu Sat  . insulin aspart  0-9 Units Subcutaneous TID WC  . insulin detemir  11 Units Subcutaneous BID  . PARoxetine  40 mg Oral QHS  . pravastatin  20 mg Oral Daily  . sodium chloride flush  3 mL Intravenous Q12H   acetaminophen **OR** acetaminophen, albuterol, bisacodyl, diphenhydrAMINE, docusate sodium, morphine injection, ondansetron **OR** ondansetron (ZOFRAN) IV, senna-docusate, traMADol  Assessment/ Plan:  82 y.o. female with past medical history of atrial fibrillation on  Coumadin, hypertension, gout, insulin-dependent diabetes mellitus type 2, diabetic neuropathy, CVA with right visual field blindness, hyperlipidemia, major depressive disorder, overactive bladder with incontinence, generalized anxiety disorder, congestive heart failure diastolic, parathyroidectomy, and osteoarthritis, s/p left CEA 07/26/15. Patient has never smoked.  [facility]   1. ESRD on HD TTHS: seen and examined on hemodialysis.   2. Anemia chronic kidney disease. - hold epo due to ischemic limb and recent thrombosis  3. Secondary hyperparathyroidism.  - continue calcium acetate (currently on PhosLo until pharmacy can get Phoslyra in stock)  4. Right lower  extremity ischemia: as per Vascular surgery.  - heparin gtt.   5. Hypertension: at goal - diltiazem.    LOS: 3 Juanjesus Pepperman 4/18/20179:20 AM

## 2015-09-17 NOTE — Care Management (Signed)
Late entry:  Assessment completed 09/16/15  Patient admitted for PVD.  Patient lives at home with her daughter.  Patient has a RW, toilet seat, grab bar, transfer seat, scooter chair, and "jazzy" chair".  Patient obtains her medications from [facility], and from [facility].  [facility]  I have [facility] [place] [facility].  Will need resumption of care orders at time of discharge.  No needs identified at this time.  RNCM following

## 2015-09-17 NOTE — Discharge Instructions (Addendum)
°  DIET:  Renal diet  DISCHARGE CONDITION:  Stable  ACTIVITY:  Activity as tolerated  OXYGEN:  Home Oxygen: Yes.     Oxygen Delivery: 2 liters/min via Patient connected to nasal cannula oxygen  DISCHARGE LOCATION:  SNF  If you experience worsening of your admission symptoms, develop shortness of breath, life threatening emergency, suicidal or homicidal thoughts you must seek medical attention immediately by calling 911 or calling your MD immediately  if symptoms less severe.  You Must read complete instructions/literature along with all the possible adverse reactions/side effects for all the Medicines you take and that have been prescribed to you. Take any new Medicines after you have completely understood and accpet all the possible adverse reactions/side effects.   Please note  You were cared for by a hospitalist during your hospital stay. If you have any questions about your discharge medications or the care you received while you were in the hospital after you are discharged, you can call the unit and asked to speak with the hospitalist on call if the hospitalist that took care of you is not available. Once you are discharged, your primary care physician will handle any further medical issues. Please note that NO REFILLS for any discharge medications will be authorized once you are discharged, as it is imperative that you return to your primary care physician (or establish a relationship with a primary care physician if you do not have one) for your aftercare needs so that they can reassess your need for medications and monitor your lab values.   Continue Dialysis as before

## 2015-09-17 NOTE — Progress Notes (Signed)
Pre dialysis assessment 

## 2015-09-17 NOTE — Progress Notes (Signed)
[facility]  Daily Progress Note   Subjective  - 1 Day Post-Op  Feels much better.  Pain basically gone.   No access site problems No events overnight  Objective Filed Vitals:   09/17/15 1100 09/17/15 1115 09/17/15 1130 09/17/15 1145  BP: 115/46 111/51 114/41 122/58  Pulse: 68 69 76 69  Temp:      TempSrc:      Resp: 15 16 17 17   Height:      Weight:      SpO2: 100% 100% 100% 100%    Intake/Output Summary (Last 24 hours) at 09/17/15 1318 Last data filed at 09/17/15 0529  Gross per 24 hour  Intake 141.26 ml  Output      0 ml  Net 141.26 ml    PULM  CTAB CV  RRR VASC  Feet warm, good cap refill  Laboratory CBC    Component Value Date/Time   WBC 17.0* 09/17/2015 0327   WBC 7.9 12/03/2013 0820   HGB 9.5* 09/17/2015 0327   HGB 14.2 12/03/2013 0820   HCT 29.2* 09/17/2015 0327   HCT 42.7 12/03/2013 0820   PLT 239 09/17/2015 0327   PLT 214 12/03/2013 0820    BMET    Component Value Date/Time   NA 129* 09/17/2015 0327   NA 137 07/04/2014 1303   K 6.1* 09/17/2015 0327   K 3.3* 07/04/2014 1303   CL 98* 09/17/2015 0327   CL 100 07/04/2014 1303   CO2 16* 09/17/2015 0327   CO2 29 07/04/2014 1303   GLUCOSE 105* 09/17/2015 0327   GLUCOSE 76 07/04/2014 1303   BUN 72* 09/17/2015 0327   BUN 44* 07/04/2014 1303   CREATININE 5.94* 09/17/2015 0327   CREATININE 2.72* 07/04/2014 1303   CALCIUM 7.8* 09/17/2015 0327   CALCIUM 8.9 07/04/2014 1303   GFRNONAA 6* 09/17/2015 0327   GFRNONAA 18* 07/04/2014 1303   GFRNONAA 29* 12/02/2013 0847   GFRAA 7* 09/17/2015 0327   GFRAA 22* 07/04/2014 1303   GFRAA 34* 12/02/2013 0847    Assessment/Planning: POD #1 s/p extensive RLE revascularization   Doing well  No foot pain today  Ulcers will be slow healing.  Avoid pressure, local wound care  No other vascular recs other than anticoagulation.    RTC 2 weeks with ABIs    DEW,JASON  09/17/2015, 1:18 PM

## 2015-09-17 NOTE — Progress Notes (Signed)
Post dialysis assessment 

## 2015-09-17 NOTE — Progress Notes (Signed)
ANTICOAGULATION CONSULT NOTE - Initial Consult  Pharmacy Consult for warfarin Indication: atrial fibrilltion  Allergies  Allergen Reactions  . Codeine Other (See Comments)    Reaction:  Confusion/Hallucinations   . Contrast Media [Iodinated Diagnostic Agents] Other (See Comments)    "skin peel"  . Oxycodone Other (See Comments)    Reaction:  Confusion/Hallucinations   . Quinine Derivatives Rash   Labs:  Recent Labs  09/14/15 1802 09/15/15 0549  09/16/15 0002 09/16/15 0937 09/16/15 1904 09/17/15 0327  HGB 10.9* 10.4*  --   --   --   --  9.5*  HCT 32.8* 31.2*  --   --   --   --  29.2*  PLT 250 243  --   --   --   --  239  LABPROT 21.6* 20.9*  --  24.7*  --   --  28.1*  INR 1.89 1.81  --  2.26  --   --  2.68  HEPARINUNFRC  --  0.17*  < > 0.26* 0.51 0.24* 0.39  CREATININE 3.10* 3.75*  --   --   --   --  5.94*  < > = values in this interval not displayed.  Estimated Creatinine Clearance: 7.7 mL/min (by C-G formula based on Cr of 5.94).   Assessment: Patient on coumadin for afib. Has been on heparin drip and coumadin this admission, coumadin held 4/17 for vascular procedure. Heparin drip stopped this morning, coumadin to resume. INR currently therapeutic.   4/15: INR 1.89, coumadin 3mg , heparin drip 4/16: INR 1.81, coumadin 3mg , heparin drip 4/17: INR: 2.26, coumadin held, heparin drip 4/18: INR 2.68  Goal of Therapy:  INR; 2-3  Monitor platelets by anticoagulation protocol: Yes   Plan:  Per med history patient takes coumadin 1.5mg  daily except on Mondays. Will resume home dose of 1.5mg  daily and follow up with patient in regards to what dose she takes on Mondays (3mg ?). Recheck INR with AM labs, CBC every 3 days.  Garlon HatchetJody Ahan Eisenberger, PharmD Clinical Pharmacist

## 2015-09-18 LAB — GLUCOSE, CAPILLARY
GLUCOSE-CAPILLARY: 158 mg/dL — AB (ref 65–99)
Glucose-Capillary: 147 mg/dL — ABNORMAL HIGH (ref 65–99)

## 2015-09-18 LAB — CBC WITH DIFFERENTIAL/PLATELET
BASOS PCT: 0 %
Basophils Absolute: 0.1 10*3/uL (ref 0–0.1)
EOS ABS: 0 10*3/uL (ref 0–0.7)
EOS PCT: 0 %
HCT: 26.7 % — ABNORMAL LOW (ref 35.0–47.0)
Hemoglobin: 8.8 g/dL — ABNORMAL LOW (ref 12.0–16.0)
Lymphocytes Relative: 2 %
Lymphs Abs: 0.3 10*3/uL — ABNORMAL LOW (ref 1.0–3.6)
MCH: 30.4 pg (ref 26.0–34.0)
MCHC: 33 g/dL (ref 32.0–36.0)
MCV: 92 fL (ref 80.0–100.0)
MONO ABS: 0.8 10*3/uL (ref 0.2–0.9)
MONOS PCT: 5 %
Neutro Abs: 15.2 10*3/uL — ABNORMAL HIGH (ref 1.4–6.5)
Neutrophils Relative %: 93 %
Platelets: 259 10*3/uL (ref 150–440)
RBC: 2.9 MIL/uL — ABNORMAL LOW (ref 3.80–5.20)
RDW: 17.5 % — AB (ref 11.5–14.5)
WBC: 16.4 10*3/uL — ABNORMAL HIGH (ref 3.6–11.0)

## 2015-09-18 LAB — PROTIME-INR
INR: 2.3
PROTHROMBIN TIME: 25.1 s — AB (ref 11.4–15.0)

## 2015-09-18 MED ORDER — WARFARIN SODIUM 2 MG PO TABS: 2.5000 mg | ORAL_TABLET | Freq: Once | ORAL | Status: DC

## 2015-09-18 MED ORDER — FLEET ENEMA 7-19 GM/118ML RE ENEM
1.0000 | ENEMA | Freq: Once | RECTAL | Status: AC
  Administered 2015-09-18: 1 via RECTAL

## 2015-09-18 MED ORDER — WARFARIN SODIUM 1 MG PO TABS: 1.5000 mg | ORAL_TABLET | Freq: Every day | ORAL | Status: DC

## 2015-09-18 MED ORDER — TRAMADOL HCL 50 MG PO TABS: 50.0000 mg | ORAL_TABLET | Freq: Four times a day (QID) | ORAL | Status: DC | PRN

## 2015-09-18 MED ORDER — POLYETHYLENE GLYCOL 3350 17 G PO PACK: 17.0000 g | PACK | Freq: Once | ORAL | Status: DC

## 2015-09-18 MED ORDER — BISACODYL 10 MG RE SUPP: 10.0000 mg | Freq: Once | RECTAL | Status: DC

## 2015-09-18 NOTE — Discharge Summary (Signed)
[facility] - [place] at [facility]   PATIENT NAME: [name]    MR#:  [id number]  DATE OF BIRTH:  1933-09-29  DATE OF ADMISSION:  09/14/2015 ADMITTING PHYSICIAN: Marguarite Arbour, MD  DATE OF DISCHARGE: No discharge date for patient encounter.  PRIMARY CARE PHYSICIAN: [name], MD   ADMISSION DIAGNOSIS:  Right leg pain [M79.604] Vascular occlusion [I99.9] Pain of right lower extremity [M79.604] Pain of left lower extremity [M79.605]  DISCHARGE DIAGNOSIS:  Principal Problem:   PVD (peripheral vascular disease) with claudication (HCC) Active Problems:   End stage renal disease (HCC)   Pressure ulcer   Chronic atrial fibrillation (HCC)   Leg pain, right   SECONDARY DIAGNOSIS:   Past Medical History  Diagnosis Date  . Chronic kidney disease   . Dysrhythmia   . Diabetes mellitus without complication (HCC)   . Hypertension   . Stroke (HCC)   . Arthritis     gout  . Dialysis patient (HCC)   . Pleural effusion   . Pulmonary hypertension (HCC)   . Renal insufficiency   . CHF (congestive heart failure) (HCC)   . Peripheral vascular disease (HCC)   . Shortness of breath dyspnea     with exertion  . GERD (gastroesophageal reflux disease)   . Anemia   . Restless leg syndrome   . Atrial fibrillation (HCC)      ADMITTING HISTORY  HPI: [name] is a 82 y.o. female has a past medical history significant for chronic a-fib, previous CVA, DM, ESRD on HD and PVD now with acute worsening pain of RLE. Presented to ER where RLE was tender and cold. INR subtherapeutic. Was seen by Vascular in the ER. CT suggested collateral flow around a presumed popliteal blockage. She is now admitted. No fever. Denies CP or SOB. No N/V/D. Tolerated HD well this AM.  HOSPITAL COURSE:   [name] is a 82 y.o. female has a past medical history significant for chronic a-fib, previous CVA, DM, ESRD on HD and PVD now with acute worsening pain of RLE.  Presented to ER where RLE was tender and cold. INR subtherapeutic  * Acute on chronic peripheral vascular disease with significant claudication and rest pain right foot more than left S/p RLE angio and thrombectomy Stop heparin. Restart Coumadin, ASA. Discussed with Dr. Wyn Quaker. F/u in clinic 1-2 weeks Pain free now  * Hypertension continue home meds  * Type 2 diabetes sliding scale and insulin  * ESRD Nephrology on board for HD.  Stable for discharge to SNF  CONSULTS OBTAINED:  Treatment Team:  Mosetta Pigeon, MD Bertram Denver, MD Lamar Blinks, MD  DRUG ALLERGIES:   Allergies  Allergen Reactions  . Codeine Other (See Comments)    Reaction:  Confusion/Hallucinations   . Contrast Media [Iodinated Diagnostic Agents] Other (See Comments)    "skin peel"  . Oxycodone Other (See Comments)    Reaction:  Confusion/Hallucinations   . Quinine Derivatives Rash    DISCHARGE MEDICATIONS:   Current Discharge Medication List    CONTINUE these medications which have NOT CHANGED   Details  acetaminophen (TYLENOL) 500 MG tablet Take 1,000 mg by mouth 2 (two) times daily.     albuterol (PROVENTIL HFA;VENTOLIN HFA) 108 (90 Base) MCG/ACT inhaler Inhale 1-2 puffs into the lungs every 6 (six) hours as needed for wheezing or shortness of breath.    aspirin 81 MG chewable tablet Chew 1 tablet (81 mg total) by mouth  daily. Qty: 30 tablet, Refills: 6    Biotin 1 MG CAPS Take 1 mg by mouth daily.     bisacodyl (DULCOLAX) 5 MG EC tablet Take 10 mg by mouth daily as needed for moderate constipation.     calcium acetate, Phos Binder, (PHOSLYRA) 667 MG/5ML SOLN Take 1,334 mg by mouth 3 (three) times daily with meals.    diltiazem (TIAZAC) 120 MG 24 hr capsule Take 120 mg by mouth daily.    diphenhydrAMINE (BENADRYL) 25 mg capsule Take 1 capsule (25 mg total) by mouth every 6 (six) hours as needed (rash, itching). Qty: 30 capsule, Refills: 0    docusate sodium (COLACE) 100 MG capsule Take  200 mg by mouth 2 (two) times daily as needed for mild constipation.    folic acid-vitamin b complex-vitamin c-selenium-zinc (DIALYVITE) 3 MG TABS tablet Take 1 tablet by mouth daily.    furosemide (LASIX) 80 MG tablet Take 80 mg by mouth 2 (two) times daily.    !! gabapentin (NEURONTIN) 100 MG capsule Take 100 mg by mouth at bedtime. Pt takes on  ml  Output   1000 ml  Net   -940 ml    PHYSICAL EXAMINATION:  Physical Exam  GENERAL:  82 y.o.-year-old patient lying in the bed with no acute distress.  LUNGS: Normal breath sounds bilaterally, no wheezing, rales,rhonchi or crepitation. No use of accessory muscles of respiration.  CARDIOVASCULAR: S1, S2 normal. No murmurs, rubs, or gallops.  ABDOMEN: Soft, non-tender, non-distended. Bowel sounds present. No organomegaly or mass.  NEUROLOGIC: Moves all 4 extremities. PSYCHIATRIC: The  patient is alert and oriented x 3.  SKIN: Stasis changed LE. Pressure ulcers  DATA REVIEW:   CBC  Recent Labs Lab 09/18/15 0458  WBC 16.4*  HGB 8.8*  HCT 26.7*  PLT 259    Chemistries   Recent Labs Lab 09/15/15 0549 09/17/15 0327  NA 129* 129*  K 4.3 6.1*  CL 97* 98*  CO2 19* 16*  GLUCOSE 242* 105*  BUN 42* 72*  CREATININE 3.75* 5.94*  CALCIUM 7.7* 7.8*  AST 50*  --   ALT 20  --   ALKPHOS 64  --   BILITOT 0.5  --     Cardiac Enzymes No results for input(s): TROPONINI in the last 168 hours.  Microbiology Results  Results for orders placed or performed during the hospital encounter of 08/13/15  Blood culture (routine x 2)     Status: None   Collection Time: 08/13/15  9:48 AM  Result Value Ref Range Status   Specimen Description BLOOD LEFT AC  Final   Special Requests   Final    BOTTLES DRAWN AEROBIC AND ANAEROBIC ANA 4ML AER 2ML   Culture NO GROWTH 5 DAYS  Final   Report Status 08/18/2015 FINAL  Final  Blood culture (routine x 2)     Status: None   Collection Time: 08/13/15  9:48 AM  Result Value Ref Range Status   Specimen Description BLOOD LEFT ARM  Final   Special Requests BOTTLES DRAWN AEROBIC AND ANAEROBIC 3ML  Final   Culture NO GROWTH 5 DAYS  Final   Report Status 08/18/2015 FINAL  Final    RADIOLOGY:  No results found.  Follow up with PCP in 1  week.  Management plans discussed with the patient, family and they are in agreement.  CODE STATUS:     Code Status Orders        Start     Ordered   09/14/15 2247  Full code   Continuous     09/14/15 2246    Code Status History    Date Active Date Inactive Code Status Order ID Comments User Context   08/13/2015  1:01 PM 08/16/2015  4:09 PM Full Code [id number]  Altamese DillingVaibhavkumar Vachhani, MD Inpatient   06/18/2015 11:41 PM 06/25/2015  1:04 AM DNR [id number]  Ramonita LabAruna Gouru, MD Inpatient   06/07/2015  3:46 PM 06/07/2015  6:48 PM Full Code [id number]  Gwyneth RevelsJustin Fowler, DPM Inpatient   12/11/2014 10:46 PM 12/13/2014  9:38 PM DNR [id number]  Enedina FinnerSona Patel, MD Inpatient   12/11/2014  9:10 PM 12/11/2014 10:46 PM DNR [id number]  Enedina FinnerSona Patel, MD ED    Advance Directive Documentation        Most Recent Value   Type of Advance Directive  Healthcare Power of Attorney   Pre-existing out of facility DNR order (yellow form or pink MOST form)     "MOST" Form in Place?        TOTAL TIME TAKING CARE OF THIS PATIENT ON DAY OF DISCHARGE: more than 30 minutes.   Milagros LollSudini, Keyante Durio R M.D on 09/18/2015 at 9:33 AM  Between 7am to 6pm - Pager - [phone]  After 6pm go to [email or web address] - password [email or web address]  [facility]  Office  [phone]  CC: Primary care physician; Rolm GalaGRANDIS, HEIDI, MD  Note: This dictation was prepared with Dragon dictation along with smaller phrase technology. Any transcriptional errors that result from this process are unintentional.

## 2015-09-18 NOTE — Clinical Social Work Note (Signed)
Received call at 4:45 pm yesterday afternoon from RN CM stating that PT had just finished working with patient who had been discharged in the morning hours and are recommending STR. Patient's discharge was cancelled due to daughter stating she could not take patient home and patient would need to go to rehab for transfers. Patient is wheelchair bound at baseline. Patient's daughter was made aware by RN CM that there was no guarantee that medicare would pay for last evening due to patient being medically ready for discharge. CSW has initiated bedsearch with expectation of patient's discharge this morning. York SpanielMonica Vernal Rutan MSW,LCSW [phone]

## 2015-09-18 NOTE — Progress Notes (Signed)
ANTICOAGULATION CONSULT NOTE - Initial Consult  Pharmacy Consult for warfarin Indication: atrial fibrilltion  Allergies  Allergen Reactions  . Codeine Other (See Comments)    Reaction:  Confusion/Hallucinations   . Contrast Media [Iodinated Diagnostic Agents] Other (See Comments)    "skin peel"  . Oxycodone Other (See Comments)    Reaction:  Confusion/Hallucinations   . Quinine Derivatives Rash   Labs:  Recent Labs  09/16/15 0002 09/16/15 0937 09/16/15 1904 09/17/15 0327 09/18/15 0458  HGB  --   --   --  9.5* 8.8*  HCT  --   --   --  29.2* 26.7*  PLT  --   --   --  239 259  LABPROT 24.7*  --   --  28.1* 25.1*  INR 2.26  --   --  2.68 2.30  HEPARINUNFRC 0.26* 0.51 0.24* 0.39  --   CREATININE  --   --   --  5.94*  --     Estimated Creatinine Clearance: 6.9 mL/min (by C-G formula based on Cr of 5.94).   Assessment: Patient on coumadin for afib. Has been on heparin drip and coumadin this admission, coumadin held 4/17 for vascular procedure. Heparin drip stopped this morning, coumadin to resume. INR currently therapeutic.   4/15: INR 1.89, coumadin 3mg , heparin drip 4/16: INR 1.81, coumadin 3mg , heparin drip 4/17: INR: 2.26, coumadin held, heparin drip 4/18: INR 2.68; 1.5 mg 4/19: INR: 2.30   Goal of Therapy:  INR; 2-3  Monitor platelets by anticoagulation protocol: Yes   Plan:   Patient's INR therapeutic however trending downward. Will give warfarin 2.5 mg PO x 1 and then will continue warfarin 1.5 mg PO daily. Per med history patient takes coumadin 1.5mg  daily except on Mondays. Will need to  follow up with patient in regards to what dose she takes on Mondays (3mg ?). Recheck INR with AM labs, CBC every 3 days.  Demetrius Charityeldrin D. Dalen Hennessee, PharmD  Clinical Pharmacist

## 2015-09-18 NOTE — Care Management (Signed)
Discharging to SNF.  RNCM signing off

## 2015-09-18 NOTE — Progress Notes (Signed)
Subjective:   Hemodialysis yesterday. Tolerated treatment well. UF of 1 litre  Objective:  Vital signs in last 24 hours:  Temp:  [97.9 F (36.6 C)-98.1 F (36.7 C)] 98.1 F (36.7 C) (04/19 0527) Pulse Rate:  [69-93] 71 (04/19 0527) Resp:  [16-22] 18 (04/19 0527) BP: (93-125)/(40-58) 111/55 mmHg (04/19 0527) SpO2:  [91 %-100 %] 91 % (04/19 0527) Weight:  [67 kg (147 lb 11.3 oz)] 67 kg (147 lb 11.3 oz) (04/19 0500)  Weight change: -1.399 kg (-3 lb 1.4 oz) Filed Weights   09/17/15 0500 09/17/15 0924 09/18/15 0500  Weight: 74.299 kg (163 lb 12.8 oz) 72.9 kg (160 lb 11.5 oz) 67 kg (147 lb 11.3 oz)    Intake/Output:    Intake/Output Summary (Last 24 hours) at 09/18/15 1101 Last data filed at 09/18/15 0900  Gross per 24 hour  Intake    300 ml  Output   1000 ml  Net   -700 ml     Physical Exam: General: No acute distress, laying in the bed  HEENT Anicteric, moist oral mucous membranes  Neck supple  Pulm/lungs Mild scattered rhonchi, otherwise clear  CVS/Heart Irregular  Abdomen:  Soft, nontender, nondistended  Extremities: Nonhealing ulcers over heels and toes, well perfused.   Neurologic: Alert, oriented  Skin: No rashes, cyanotic discoloration of right foot  Access: Right upper arm AV fistula       Basic Metabolic Panel:   Recent Labs Lab 09/14/15 1802 09/15/15 0549 09/17/15 0327  NA 130* 129* 129*  K 3.0* 4.3 6.1*  CL 95* 97* 98*  CO2 22 19* 16*  GLUCOSE 216* 242* 105*  BUN 34* 42* 72*  CREATININE 3.10* 3.75* 5.94*  CALCIUM 8.0* 7.7* 7.8*  PHOS  --   --  9.1*     CBC:  Recent Labs Lab 09/14/15 1802 09/15/15 0549 09/17/15 0327 09/18/15 0458  WBC 9.9 12.6* 17.0* 16.4*  NEUTROABS 7.9*  --   --  15.2*  HGB 10.9* 10.4* 9.5* 8.8*  HCT 32.8* 31.2* 29.2* 26.7*  MCV 93.3 93.9 92.2 92.0  PLT 250 243 239 259      Microbiology:  No results found for this or any previous visit (from the past 720 hour(s)).  Coagulation Studies:  Recent Labs  09/16/15 0002 09/17/15 0327 09/18/15 0458  LABPROT 24.7* 28.1* 25.1*  INR 2.26 2.68 2.30    Urinalysis: No results for input(s): COLORURINE, LABSPEC, PHURINE, GLUCOSEU, HGBUR, BILIRUBINUR, KETONESUR, PROTEINUR, UROBILINOGEN, NITRITE, LEUKOCYTESUR in the last 72 hours.  Invalid input(s): APPERANCEUR    Imaging: No results found.   Medications:     . aspirin  81 mg Oral Daily  . b complex-vitamin c-folic acid  1 tablet Oral Daily  . bisacodyl  10 mg Rectal Once  . calcium acetate  1,334 mg Oral TID WC  . collagenase   Topical Daily  . diltiazem  120 mg Oral Daily  . famotidine (PEPCID) IV  20 mg Intravenous Daily  . furosemide  80 mg Oral BID  . gabapentin  100 mg Oral Once per day on Sun Mon Wed Fri  . gabapentin  300 mg Oral Once per day on Tue Thu Sat  . insulin aspart  0-9 Units Subcutaneous TID WC  . insulin detemir  11 Units Subcutaneous BID  . PARoxetine  40 mg Oral QHS  . polyethylene glycol  17 g Oral Once  . pravastatin  20 mg Oral Daily  . sodium chloride flush  3 mL Intravenous Q12H  .  sodium phosphate  1 enema Rectal Once  . [START ON 09/19/2015] warfarin  1.5 mg Oral q1800  . warfarin  2.5 mg Oral ONCE-1800  . Warfarin - Pharmacist Dosing Inpatient   Does not apply q1800   acetaminophen **OR** acetaminophen, albuterol, bisacodyl, diphenhydrAMINE, docusate sodium, morphine injection, ondansetron **OR** ondansetron (ZOFRAN) IV, senna-docusate, traMADol  Assessment/ Plan:  82 y.o. female with past medical history of atrial fibrillation on Coumadin, hypertension, gout, insulin-dependent diabetes mellitus type 2, diabetic neuropathy, CVA with right visual field blindness, hyperlipidemia, major depressive disorder, overactive bladder with incontinence, generalized anxiety disorder, congestive heart failure diastolic, parathyroidectomy, and osteoarthritis, s/p left CEA 07/26/15. Patient has never smoked.  [facility]   1. ESRD on HD TTHS:tolerated  treatment well. Next treatment for Thursday  2. Anemia chronic kidney disease. - epo with treatment  3. Secondary hyperparathyroidism.  - continue calcium acetate   4. Hypertension: at goal - diltiazem, furosemide.    LOS: 4 [name] 4/19/201711:01 AM

## 2015-09-18 NOTE — NC FL2 (Signed)
[place] MEDICAID FL2 LEVEL OF CARE SCREENING TOOL     IDENTIFICATION  Patient Name: [name] Birthdate: Mar 14, 1934 Sex: female Admission Date (Current Location): 09/14/2015  [facility] [place]   Facility and Address:  [facility], [address], [place], [place] [address]      Provider Number: [id number]  Attending Physician Name and Address:  Milagros LollSrikar Sudini, MD  Relative Name and Phone Number:       Current Level of Care: Hospital Recommended Level of Care: Skilled Nursing Facility Prior Approval Number:    Date Approved/Denied:   PASRR Number:    Discharge Plan: SNF    Current Diagnoses: Patient Active Problem List   Diagnosis Date Noted  . Leg pain, right 09/14/2015  . PVD (peripheral vascular disease) with claudication (HCC) 09/14/2015  . Pneumonia 08/13/2015  . Carotid stenosis 07/26/2015  . Amputation of fifth toe, left, traumatic (HCC) 06/24/2015  . Diabetes mellitus (HCC) 06/23/2015  . Diabetic neuropathy (HCC) 06/23/2015  . Chronic atrial fibrillation (HCC) 06/23/2015  . Left carotid artery stenosis 06/23/2015  . CVA (cerebral infarction) 06/21/2015  . Pressure ulcer 06/19/2015  . End stage renal disease (HCC) 06/18/2015  . Respiratory distress 12/11/2014    Orientation RESPIRATION BLADDER Height & Weight     Self, Place  Normal Incontinent Weight: 160 lb 11.5 oz (72.9 kg) (bed scale) Height:  5\' 5"  (165.1 cm)  BEHAVIORAL SYMPTOMS/MOOD NEUROLOGICAL BOWEL NUTRITION STATUS   (none)  (none) Incontinent  (renal carb modified/fluid restriction)  AMBULATORY STATUS COMMUNICATION OF NEEDS Skin   Extensive Assist Verbally PU Stage and Appropriate Care                       Personal Care Assistance Level of Assistance  Bathing, Dressing Bathing Assistance: Limited assistance   Dressing Assistance: Limited assistance     Functional Limitations Info             SPECIAL CARE FACTORS  FREQUENCY  PT (By licensed PT)                    Contractures Contractures Info: Not present    Additional Factors Info  Code Status, Allergies Code Status Info: full Allergies Info: codeine, oxycodone, contrast           Current Medications (09/18/2015):  This is the current hospital active medication list Current Facility-Administered Medications  Medication Dose Route Frequency Provider Last Rate Last Dose  . acetaminophen (TYLENOL) tablet 650 mg  650 mg Oral Q6H PRN Marguarite ArbourJeffrey D Sparks, MD       Or  . acetaminophen (TYLENOL) suppository 650 mg  650 mg Rectal Q6H PRN Marguarite ArbourJeffrey D Sparks, MD      . albuterol (PROVENTIL) (2.5 MG/3ML) 0.083% nebulizer solution 3 mL  3 mL Inhalation Q6H PRN Marguarite ArbourJeffrey D Sparks, MD      . aspirin chewable tablet 81 mg  81 mg Oral Daily Marguarite ArbourJeffrey D Sparks, MD   81 mg at 09/17/15 1353  . b complex-vitamin c-folic acid (NEPHRO-VITE) tablet 1 tablet  1 tablet Oral Daily Marguarite ArbourJeffrey D Sparks, MD   1 tablet at 09/17/15 1353  . bisacodyl (DULCOLAX) suppository 10 mg  10 mg Rectal Daily PRN Marguarite ArbourJeffrey D Sparks, MD      . calcium acetate (PHOSLO) capsule 1,334 mg  1,334 mg Oral TID WC Lamont DowdySarath Kolluru, MD      . collagenase (SANTYL) ointment   Topical Daily Srikar Sudini,  MD      . diltiazem (CARDIZEM CD) 24 hr capsule 120 mg  120 mg Oral Daily Marguarite Arbour, MD   120 mg at 09/17/15 1352  . diphenhydrAMINE (BENADRYL) capsule 25 mg  25 mg Oral Q6H PRN Marguarite Arbour, MD      . docusate sodium (COLACE) capsule 200 mg  200 mg Oral BID PRN Marguarite Arbour, MD      . famotidine (PEPCID) IVPB 20 mg premix  20 mg Intravenous Daily Marguarite Arbour, MD   20 mg at 09/17/15 1356  . furosemide (LASIX) tablet 80 mg  80 mg Oral BID Marguarite Arbour, MD   80 mg at 09/17/15 1835  . gabapentin (NEURONTIN) capsule 100 mg  100 mg Oral Once per day on Sun Mon Wed Fri Marguarite Arbour, MD   100 mg at 09/16/15 2150  . gabapentin (NEURONTIN) capsule 300 mg  300 mg Oral Once per day on Tue  Thu Sat Marguarite Arbour, MD   300 mg at 09/17/15 2219  . insulin aspart (novoLOG) injection 0-9 Units  0-9 Units Subcutaneous TID WC Marguarite Arbour, MD   3 Units at 09/17/15 1849  . insulin detemir (LEVEMIR) injection 11 Units  11 Units Subcutaneous BID Marguarite Arbour, MD   11 Units at 09/17/15 2217  . morphine 2 MG/ML injection 2 mg  2 mg Intravenous Q2H PRN Marguarite Arbour, MD   2 mg at 09/16/15 1027  . ondansetron (ZOFRAN) tablet 4 mg  4 mg Oral Q6H PRN Marguarite Arbour, MD       Or  . ondansetron [facility]) injection 4 mg  4 mg Intravenous Q6H PRN Marguarite Arbour, MD      . PARoxetine (PAXIL) tablet 40 mg  40 mg Oral QHS Marguarite Arbour, MD   40 mg at 09/17/15 2218  . pravastatin (PRAVACHOL) tablet 20 mg  20 mg Oral Daily Marguarite Arbour, MD   20 mg at 09/17/15 1353  . senna-docusate (Senokot-S) tablet 1 tablet  1 tablet Oral BID PRN Marguarite Arbour, MD      . sodium chloride flush (NS) 0.9 % injection 3 mL  3 mL Intravenous Q12H Marguarite Arbour, MD   3 mL at 09/17/15 2219  . traMADol (ULTRAM) tablet 50 mg  50 mg Oral Q6H PRN Marguarite Arbour, MD   50 mg at 09/16/15 1418  . warfarin (COUMADIN) tablet 1.5 mg  1.5 mg Oral q1800 Milagros Loll, MD   1.5 mg at 09/17/15 1836  . Warfarin - Pharmacist Dosing Inpatient   Does not apply q1800 Milagros Loll, MD         Discharge Medications: Please see discharge summary for a list of discharge medications.  Relevant Imaging Results:  Relevant Lab Results:   Additional Information SS: [id number]  York Spaniel, LCSW

## 2015-09-18 NOTE — Progress Notes (Signed)
Called report to receiving [facility]Kelly [facility]

## 2015-09-18 NOTE — Progress Notes (Addendum)
09/18/2015 13:45  [facility] Fleish[facility] to be D/C'd Skilled nursing facility per MD order.  Discussed prescriptions and follow up appointments with the patient. Prescriptions given to patient, medication list explained in detail. Pt verbalized understanding.    Medication List    STOP taking these medications        levofloxacin 500 MG tablet  Commonly known as:  LEVAQUIN      TAKE these medications        acetaminophen 500 MG tablet  Commonly known as:  TYLENOL  Take 1,000 mg by mouth 2 (two) times daily.     albuterol 108 (90 Base) MCG/ACT inhaler  Commonly known as:  PROVENTIL HFA;VENTOLIN HFA  Inhale 1-2 puffs into the lungs every 6 (six) hours as needed for wheezing or shortness of breath.     aspirin 81 MG chewable tablet  Chew 1 tablet (81 mg total) by mouth daily.     Biotin 1 MG Caps  Take 1 mg by mouth daily.     bisacodyl 5 MG EC tablet  Commonly known as:  DULCOLAX  Take 10 mg by mouth daily as needed for moderate constipation.     calcium acetate (Phos Binder) 667 MG/5ML Soln  Commonly known as:  PHOSLYRA  Take 1,334 mg by mouth 3 (three) times daily with meals.     diltiazem 120 MG 24 hr capsule  Commonly known as:  TIAZAC  Take 120 mg by mouth daily.     diphenhydrAMINE 25 mg capsule  Commonly known as:  BENADRYL  Take 1 capsule (25 mg total) by mouth every 6 (six) hours as needed (rash, itching).     docusate sodium 100 MG capsule  Commonly known as:  COLACE  Take 200 mg by mouth 2 (two) times daily as needed for mild constipation.     FISH OIL CONCENTRATE 300 MG Caps  Take 1 capsule by mouth daily.     folic acid-vitamin b complex-vitamin c-selenium-zinc 3 MG Tabs tablet  Take 1 tablet by mouth daily.     furosemide 80 MG tablet  Commonly known as:  LASIX  Take 80 mg by mouth 2 (two) times daily.     gabapentin 300 MG capsule  Commonly known as:  NEURONTIN  Take 300 mg by mouth daily. Pt takes on Tuesday, Thursday, and Saturday after dialysis.      gabapentin 100 MG capsule  Commonly known as:  NEURONTIN  Take 100 mg by mouth at bedtime. Pt takes on Sunday, Monday, Wednesday, and Friday.     Insulin Detemir 100 UNIT/ML Pen  Commonly known as:  LEVEMIR  Inject 11 Units into the skin 2 (two) times daily. 0730, 1730     lidocaine-prilocaine cream  Commonly known as:  EMLA  Apply 1 application topically as needed (prior to accessing port).     nystatin 100000 UNIT/GM Powd  Apply topically 2 (two) times daily as needed.     PARoxetine 40 MG tablet  Commonly known as:  PAXIL  Take 40 mg by mouth at bedtime.     pravastatin 20 MG tablet  Commonly known as:  PRAVACHOL  Take 1 tablet (20 mg total) by mouth daily.     ranitidine 150 MG tablet  Commonly known as:  ZANTAC  Take 150 mg by mouth 2 (two) times daily.     senna-docusate 8.6-50 MG tablet  Commonly known as:  Senokot-S  Take 1 tablet by mouth 2 (two) times daily as needed.  traMADol 50 MG tablet  Commonly known as:  ULTRAM  Take 1 tablet (50 mg total) by mouth every 6 (six) hours as needed for moderate pain.     warfarin 3 MG tablet  Commonly known as:  COUMADIN  Take 1.5 mg by mouth daily. Everyday except Mondays        Filed Vitals:   09/18/15 0527 09/18/15 1342  BP: 111/55 116/50  Pulse: 71 71  Temp: 98.1 F (36.7 C) 97.7 F (36.5 C)  Resp: 18 16    Skin clean, dry and intact without evidence of skin break down, no evidence of skin tears noted. IV catheter discontinued intact. Site without signs and symptoms of complications. Dressing and pressure applied. Pt denies pain at this time. No complaints noted.  An After Visit Summary was printed and given to the patient. Patient  D/C to [facility] via EMS.  Bradly Chris

## 2015-09-18 NOTE — Progress Notes (Signed)
[facility]  Daily Progress Note   Subjective  - 2 Days Post-Op  Patient seen just prior to planned discharge today.  Still feeling well.  No complaints of pain in her feet or legs today.  Much better after revascularization.  No events overnight.  Going to SNF today.  Objective Filed Vitals:   09/17/15 1500 09/17/15 2025 09/18/15 0500 09/18/15 0527  BP:  93/55  111/55  Pulse: 75 81  71  Temp:  97.9 F (36.6 C)  98.1 F (36.7 C)  TempSrc:  Oral  Oral  Resp:  19  18  Height:      Weight:   67 kg (147 lb 11.3 oz)   SpO2: 95% 97%  91%    Intake/Output Summary (Last 24 hours) at 09/18/15 1151 Last data filed at 09/18/15 0900  Gross per 24 hour  Intake    300 ml  Output   1000 ml  Net   -700 ml    PULM  CTAB CV  RRR VASC  Feet both warm with good capillary refill.  Stable ulcers on right foot.  Access site C/D/I  Laboratory CBC    Component Value Date/Time   WBC 16.4* 09/18/2015 0458   WBC 7.9 12/03/2013 0820   HGB 8.8* 09/18/2015 0458   HGB 14.2 12/03/2013 0820   HCT 26.7* 09/18/2015 0458   HCT 42.7 12/03/2013 0820   PLT 259 09/18/2015 0458   PLT 214 12/03/2013 0820    BMET    Component Value Date/Time   NA 129* 09/17/2015 0327   NA 137 07/04/2014 1303   K 6.1* 09/17/2015 0327   K 3.3* 07/04/2014 1303   CL 98* 09/17/2015 0327   CL 100 07/04/2014 1303   CO2 16* 09/17/2015 0327   CO2 29 07/04/2014 1303   GLUCOSE 105* 09/17/2015 0327   GLUCOSE 76 07/04/2014 1303   BUN 72* 09/17/2015 0327   BUN 44* 07/04/2014 1303   CREATININE 5.94* 09/17/2015 0327   CREATININE 2.72* 07/04/2014 1303   CALCIUM 7.8* 09/17/2015 0327   CALCIUM 8.9 07/04/2014 1303   GFRNONAA 6* 09/17/2015 0327   GFRNONAA 18* 07/04/2014 1303   GFRNONAA 29* 12/02/2013 0847   GFRAA 7* 09/17/2015 0327   GFRAA 22* 07/04/2014 1303   GFRAA 34* 12/02/2013 0847    Assessment/Planning: POD #2 s/p RLE revascularization   Doing well  No new recs from vascular POV.  She  is stable for discharge to follow up with me in the office in 2-3 weeks  Please call with questions.    DEW,JASON  09/18/2015, 11:51 AM

## 2015-09-18 NOTE — Care Management Important Message (Signed)
Important Message  Patient Details  Name: [name] MRN: [id number] Date of Birth: 1934-05-19   Medicare Important Message Given:  Yes    Olegario MessierKathy A Finas Delone 09/18/2015, 9:55 AM

## 2015-09-30 ENCOUNTER — Encounter: Payer: Medicare Other | Attending: Surgery | Admitting: Surgery

## 2015-09-30 DIAGNOSIS — E11621 Type 2 diabetes mellitus with foot ulcer: Secondary | ICD-10-CM | POA: Insufficient documentation

## 2015-09-30 DIAGNOSIS — I70233 Atherosclerosis of native arteries of right leg with ulceration of ankle: Secondary | ICD-10-CM | POA: Diagnosis not present

## 2015-09-30 DIAGNOSIS — Z8673 Personal history of transient ischemic attack (TIA), and cerebral infarction without residual deficits: Secondary | ICD-10-CM | POA: Insufficient documentation

## 2015-09-30 DIAGNOSIS — L97319 Non-pressure chronic ulcer of right ankle with unspecified severity: Secondary | ICD-10-CM | POA: Diagnosis not present

## 2015-09-30 DIAGNOSIS — I5033 Acute on chronic diastolic (congestive) heart failure: Secondary | ICD-10-CM | POA: Insufficient documentation

## 2015-09-30 DIAGNOSIS — I4891 Unspecified atrial fibrillation: Secondary | ICD-10-CM | POA: Diagnosis not present

## 2015-09-30 DIAGNOSIS — X58XXXA Exposure to other specified factors, initial encounter: Secondary | ICD-10-CM | POA: Insufficient documentation

## 2015-09-30 DIAGNOSIS — S31104A Unspecified open wound of abdominal wall, left lower quadrant without penetration into peritoneal cavity, initial encounter: Secondary | ICD-10-CM | POA: Insufficient documentation

## 2015-09-30 DIAGNOSIS — N186 End stage renal disease: Secondary | ICD-10-CM | POA: Diagnosis not present

## 2015-09-30 DIAGNOSIS — J9620 Acute and chronic respiratory failure, unspecified whether with hypoxia or hypercapnia: Secondary | ICD-10-CM | POA: Insufficient documentation

## 2015-09-30 DIAGNOSIS — E1152 Type 2 diabetes mellitus with diabetic peripheral angiopathy with gangrene: Secondary | ICD-10-CM | POA: Diagnosis not present

## 2015-09-30 DIAGNOSIS — E1122 Type 2 diabetes mellitus with diabetic chronic kidney disease: Secondary | ICD-10-CM | POA: Insufficient documentation

## 2015-09-30 DIAGNOSIS — S301XXA Contusion of abdominal wall, initial encounter: Secondary | ICD-10-CM | POA: Diagnosis not present

## 2015-09-30 DIAGNOSIS — Z992 Dependence on renal dialysis: Secondary | ICD-10-CM | POA: Diagnosis not present

## 2015-09-30 DIAGNOSIS — Z794 Long term (current) use of insulin: Secondary | ICD-10-CM | POA: Diagnosis not present

## 2015-09-30 DIAGNOSIS — L89610 Pressure ulcer of right heel, unstageable: Secondary | ICD-10-CM | POA: Diagnosis not present

## 2015-09-30 NOTE — Progress Notes (Addendum)
[name], [name] ([id number]) Visit Report for 09/30/2015 Allergy List Details Patient Name: [name], [name]. Date of Service: 09/30/2015 2:15 PM Medical Record Number: [id number] Patient Account Number: [id number] Date of Birth/Sex: 08-06-1933 (82 [name] Female) Treating RN: Curtis Sites Primary Care Physician: [name] Other Clinician: Referring Physician: [name] Treating Physician/Extender: Rudene Re in Treatment: 0 Allergies Active Allergies codeine Iodinated Contrast Media - Oral and IV Dye oxycodone quinine Allergy Notes Electronic Signature(s) Signed: 09/30/2015 4:21:47 PM By: Curtis Sites Entered By: Curtis Sites on 09/30/2015 14:35:09 [name] ([id number]) -------------------------------------------------------------------------------- Arrival Information Details Patient Name: [name] Date of Service: 09/30/2015 2:15 PM Medical Record Number: [id number] Patient Account Number: [id number] Date of Birth/Sex: 1933-10-23 (82 [name] Female) Treating RN: Curtis Sites Primary Care Physician: [name] Other Clinician: Referring Physician: [name] Treating Physician/Extender: Rudene Re in Treatment: 0 Visit Information Patient Arrived: Wheel Chair Arrival Time: 14:27 Accompanied By: son Transfer Assistance: Manual Patient Identification Verified: Yes Secondary Verification Process Yes Completed: Patient Has Alerts: Yes Patient Alerts: Patient on Blood Thinner DMII Warfarin ABI [place] BILATERAL Electronic Signature(s) Signed: 01/28/2016 4:02:52 PM By: Curtis Sites Previous Signature: 09/30/2015 4:21:47 PM Version By: Curtis Sites Entered By: Curtis Sites on 12/17/2015 08:15:01 [name] ([id number]) -------------------------------------------------------------------------------- Clinic Level of Care Assessment Details Patient Name: [name]. Date of Service: 09/30/2015 2:15 PM Medical  Record Number: [id number] Patient Account Number: [id number] Date of Birth/Sex: December 08, 1933 (82 [name] Female) Treating RN: Leonard Downing Primary Care Physician: [name] Other Clinician: Referring Physician: [name] Treating Physician/Extender: Rudene Re in Treatment: 0 Clinic Level of Care Assessment Items TOOL 1 Quantity Score X - Use when EandM and Procedure is performed on INITIAL visit 1 0 ASSESSMENTS - Nursing Assessment / Reassessment X - General Physical Exam (combine w/ comprehensive assessment (listed just 1 20 below) when performed on new pt. evals) X - Comprehensive Assessment (HX, ROS, Risk Assessments, Wounds Hx, etc.) 1 25 ASSESSMENTS - Wound and Skin Assessment / Reassessment  - Dermatologic / Skin Assessment (not related to wound area) 0 ASSESSMENTS - Ostomy and/or Continence Assessment and Care  - Incontinence Assessment and Management 0  - Ostomy Care Assessment and Management (repouching, etc.) 0 PROCESS - Coordination of Care X - Simple Patient / Family Education for ongoing care 1 15  - Complex (extensive) Patient / Family Education for ongoing care 0 X - Staff obtains Chiropractor, Records, Test Results / Process Orders 1 10 X - Staff telephones HHA, Nursing Homes / Clarify orders / etc 1 10  - Routine Transfer to another Facility (non-emergent condition) 0  - Routine Hospital Admission (non-emergent condition) 0  - New Admissions / Manufacturing engineer / Ordering NPWT, Apligraf, etc. 0  - Emergency Hospital Admission (emergent condition) 0 PROCESS - Special Needs  - Pediatric / Minor Patient Management 0  - Isolation Patient Management 0 [name], [name] ([id number])  - Hearing / Language / Visual special needs 0  - Assessment of Community assistance (transportation, D/C planning, etc.) 0  - Additional assistance / Altered mentation 0  - Support Surface(s) Assessment (bed, cushion, seat, etc.)  0 INTERVENTIONS - Miscellaneous  - External ear exam 0  - Patient Transfer (multiple staff / Nurse, adult / Similar devices) 0  - Simple Staple / Suture removal (25 or less) 0  - Complex Staple / Suture removal (26 or more) 0  - Hypo/Hyperglycemic Management (do not check if billed separately) 0 X - Ankle / Brachial Index (  ABI) - do not check if billed separately 1 15 Has the patient been seen at the hospital within the last three years: Yes Total Score: 95 Level Of Care: New/Established - Level 3 Electronic Signature(s) Signed: 09/30/2015 4:34:19 PM By: Lucrezia Starch, RN, Sendra Entered By: Lucrezia Starch RN, Sendra on 09/30/2015 16:10:96 [name] ([id number]) -------------------------------------------------------------------------------- Encounter Discharge Information Details Patient Name: [name]. Date of Service: 09/30/2015 2:15 PM Medical Record Number: [id number] Patient Account Number: [id number] Date of Birth/Sex: 05/31/1934 (82 [name] Female) Treating RN: Curtis Sites Primary Care Physician: [name] Other Clinician: Referring Physician: [name] Treating Physician/Extender: Rudene Re in Treatment: 0 Encounter Discharge Information Items Discharge Pain Level: 0 Discharge Condition: Stable Ambulatory Status: Wheelchair Discharge Destination: Nursing Home Transportation: Private Auto Accompanied By: son Schedule Follow-up Appointment: Yes Medication Reconciliation completed and provided to Patient/Care No [name]: Provided on Clinical Summary of Care: 09/30/2015 Form Type Recipient Paper [facility] Electronic Signature(s) Signed: 09/30/2015 4:16:33 PM By: Curtis Sites Previous Signature: 09/30/2015 4:08:35 PM Version By: Gwenlyn Perking Entered By: Curtis Sites on 09/30/2015 16:16:33 [name] ([id number]) -------------------------------------------------------------------------------- Lower Extremity Assessment  Details Patient Name: [name] Date of Service: 09/30/2015 2:15 PM Medical Record Number: [id number] Patient Account Number: [id number] Date of Birth/Sex: [birth year] (82 [name] Female) Treating RN: Curtis Sites Primary Care Physician: [name] Other Clinician: Referring Physician: [name] Treating Physician/Extender: Rudene Re in Treatment: 0 Edema Assessment Assessed: [Left: No] [Right: No] Edema: [Left: No] [Right: No] Calf Left: Right: Point of Measurement: 33 cm From Medial Instep 31 cm 32.5 cm Ankle Left: Right: Point of Measurement: 10 cm From Medial Instep 20 cm 20.4 cm Vascular Assessment Pulses: Posterior Tibial Palpable: [Left:No] [Right:No] Doppler: [Left:Monophasic] [Right:Monophasic] Dorsalis Pedis Palpable: [Left:Yes] [Right:Yes] Doppler: [Left:Monophasic] [Right:Monophasic] Extremity colors, hair growth, and conditions: Extremity Color: [Left:Hyperpigmented] [Right:Hyperpigmented] Hair Growth on Extremity: [Left:No] [Right:No] Temperature of Extremity: [Left:Cool] [Right:Cool] Capillary Refill: [Left:> 3 seconds] [Right:> 3 seconds] Toe Nail Assessment Left: Right: Thick: Yes Yes Discolored: No No Deformed: No No Improper Length and Hygiene: No No Notes ABI [place] BILATERAL [name], [name] ([id number]) Electronic Signature(s) Signed: 09/30/2015 4:21:47 PM By: Curtis Sites Entered By: Curtis Sites on 09/30/2015 15:08:39 [name] ([id number]) -------------------------------------------------------------------------------- Multi-Disciplinary Care Plan Details Patient Name: [name]. Date of Service: 09/30/2015 2:15 PM Medical Record Number: [id number] Patient Account Number: [id number] Date of Birth/Sex: Apr 30, 1934 (82 [name] Female) Treating RN: Leonard Downing Primary Care Physician: [name] Other Clinician: Referring Physician: [name] Treating Physician/Extender: Rudene Re in  Treatment: 0 Active Inactive Orientation to the Wound Care Program Nursing Diagnoses: Knowledge deficit related to the wound healing center program Goals: Patient/caregiver will verbalize understanding of the Wound Healing Center Program Date Initiated: 09/30/2015 Goal Status: Active Interventions: Provide education on orientation to the wound center Notes: Wound/Skin Impairment Nursing Diagnoses: Knowledge deficit related to ulceration/compromised skin integrity Goals: Ulcer/skin breakdown will have a volume reduction of 30% by week 4 Date Initiated: 09/30/2015 Goal Status: Active Ulcer/skin breakdown will have a volume reduction of 50% by week 8 Date Initiated: 09/30/2015 Goal Status: Active Ulcer/skin breakdown will have a volume reduction of 80% by week 12 Date Initiated: 09/30/2015 Goal Status: Active Ulcer/skin breakdown will heal within 14 weeks Date Initiated: 09/30/2015 Goal Status: Active Interventions: Assess patient/caregiver ability to obtain necessary supplies Assess ulceration(s) every visit [name], [name] ([id number]) Provide education on ulcer and skin care Notes: Electronic Signature(s) Signed: 09/30/2015 4:34:19 PM By: Lucrezia Starch, RN, Sendra Entered By: Lucrezia Starch, RN,  Sendra on 09/30/2015 15:45:00 [name], [name] ([id number]) -------------------------------------------------------------------------------- Patient/Caregiver Education Details Patient Name: ROSARIO, DUEY. Date of Service: 09/30/2015 2:15 PM Medical Record Number: [id number] Patient Account Number: [id number] Date of Birth/Gender: February 21, 1934 (82 [name] Female) Treating RN: Curtis Sites Primary Care Physician: [name] Other Clinician: Referring Physician: [name] Treating Physician/Extender: Rudene Re in Treatment: 0 Education Assessment Education Provided To: Patient Education Topics Provided Wound/Skin Impairment: Handouts: Other: wound care as ordered Methods:  Demonstration, Explain/Verbal Responses: State content correctly Electronic Signature(s) Signed: 09/30/2015 4:16:50 PM By: Curtis Sites Entered By: Curtis Sites on 09/30/2015 16:16:50 [name] ([id number]) -------------------------------------------------------------------------------- Wound Assessment Details Patient Name: [name] Date of Service: 09/30/2015 2:15 PM Medical Record Number: [id number] Patient Account Number: [id number] Date of Birth/Sex: Oct 29, 1933 (82 [name] 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 Third Wound Open Wounding Event: Not Known Status: Date Acquired: 09/02/2015 Comorbid Arrhythmia, Congestive Heart Weeks Of Treatment: 0 History: Failure, Type II Diabetes Clustered Wound: No Pending Amputation On Presentation Photos Photo Uploaded By: Curtis Sites on 09/30/2015 15:25:10 Wound Measurements Length: (cm) 1.1 % Reduction in Width: (cm) 1 % Reduction in Depth: (cm) 0.1 Epithelializati Area: (cm) 0.864 Tunneling: Volume: (cm) 0.086 Undermining: Area: 0% Volume: 0% on: None No No Wound Description Classification: Unclassifiable Diabetic Severity (Wagner): Grade 1 Wound Margin: Flat and Intact Exudate Amount: None Present Foul Odor After Cleansing: No Wound Bed Granulation Amount: None Present (0%) Exposed Structure Necrotic Amount: Large (67-100%) Fascia Exposed: No Necrotic Quality: Eschar Fat Layer Exposed: No Tendon Exposed: No [name], [name] ([id number]) Muscle Exposed: No Joint Exposed: No Bone Exposed: No Limited to Skin Breakdown Periwound Skin Texture Texture Color No Abnormalities Noted: No No Abnormalities Noted: No Callus: No Atrophie Blanche:  No Crepitus: No Cyanosis: No Excoriation: No Ecchymosis: No Fluctuance: No Erythema: Yes Friable: No Erythema Location: Circumferential Induration: No Hemosiderin Staining: No Localized Edema: Yes Mottled: No Rash: No Pallor: No Scarring: No Rubor: No Moisture Temperature / Pain No Abnormalities Noted: No Tenderness on Palpation: Yes Dry / Scaly: Yes Maceration: No Moist: No Wound Preparation Ulcer Cleansing: Rinsed/Irrigated with Saline Topical Anesthetic Applied: None Electronic Signature(s) Signed: 09/30/2015 3:21:50 PM By: Curtis Sites Previous Signature: 09/30/2015 3:12:11 PM Version By: Curtis Sites Entered By: Curtis Sites on 09/30/2015 15:21:50 [name] ([id number]) -------------------------------------------------------------------------------- Wound Assessment Details Patient Name: [name] Date of Service: 09/30/2015 2:15 PM Medical Record Number: [id number] Patient Account Number: [id number] Date of Birth/Sex: [birth year] (82 [name] 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 Fourth Wound Open Wounding Event: Not Known Status: Date Acquired: 09/02/2015 Comorbid Arrhythmia, Congestive Heart Weeks Of Treatment: 0 History: Failure, Type II Diabetes Clustered Wound: No Pending Amputation On Presentation Photos Photo Uploaded By: Curtis Sites on 09/30/2015 15:25:10 Wound Measurements Length: (cm) 3.1 % Reduction in Width: (cm) 2 % Reduction in Depth: (cm) 0.1 Epithelializati Area: (cm) 4.869 Tunneling: Volume: (cm) 0.487 Undermining: Area: Volume: on: None No No Wound Description Classification: Partial Thickness Diabetic Severity [address]): Grade 1 Wound Margin: Flat and  Intact Exudate Amount: Medium Exudate Type: Serous Exudate Color: amber Foul Odor After Cleansing: No Wound Bed Granulation Amount:  Small (1-33%) Exposed Structure Granulation Quality: Pink Fascia Exposed: No Natalie RimaHOPKINS, Natalie Y. ([id number]) Necrotic Amount: Large (67-100%) Fat Layer Exposed: No Necrotic Quality: Eschar, Adherent Slough Tendon Exposed: No Muscle Exposed: No Joint Exposed: No Bone Exposed: No Limited to Skin Breakdown Periwound Skin Texture Texture Color No Abnormalities Noted: No No Abnormalities Noted: No Callus: No Atrophie Blanche: No Crepitus: No Cyanosis: No Excoriation: No Ecchymosis: No Fluctuance: No Erythema: Yes Friable: No Erythema Location: Circumferential Induration: No Hemosiderin Staining: No Localized Edema: Yes Mottled: No Rash: No Pallor: No Scarring: No Rubor: No Moisture Temperature / Pain No Abnormalities Noted: No Tenderness on Palpation: Yes Dry / Scaly: No Maceration: No Moist: No Wound Preparation Ulcer Cleansing: Rinsed/Irrigated with Saline Topical Anesthetic Applied: None Electronic Signature(s) Signed: 09/30/2015 3:13:21 PM By: Curtis Sitesorthy, Joanna Entered By: Curtis Sitesorthy, Joanna on 09/30/2015 15:13:20 Natalie RimaHOPKINS, Natalie Y. ([id number]) -------------------------------------------------------------------------------- Wound Assessment Details Patient Name: Natalie RimaHOPKINS, Natalie Y. Date of Service: 09/30/2015 2:15 PM Medical Record Number: [id number] Patient Account Number: [id number] Date of Birth/Sex: Mar 21, 1934 (82 [name] 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 Wound Open Wounding Event: Not Known Status: Date Acquired: 09/02/2015 Comorbid Arrhythmia, Congestive Heart Weeks Of  Treatment: 0 History: Failure, Type II Diabetes Clustered Wound: No Pending Amputation On Presentation Photos Photo Uploaded By: Curtis Sitesorthy, Joanna on 09/30/2015 15:25:32 Wound Measurements Length: (cm) 1.6 % Reduction in Width: (cm) 1.5 % Reduction in Depth: (cm) 0.1 Epithelializati Area: (cm) 1.885 Tunneling: Volume: (cm) 0.188 Undermining: Area: Volume: on: None No No Wound Description Classification: Unclassifiable Diabetic Severity (Wagner): Grade 1 Wound Margin: Flat and Intact Exudate Amount: None Present Foul Odor After Cleansing: No Wound Bed Granulation Amount: None Present (0%) Exposed Structure Necrotic Amount: Large (67-100%) Fascia Exposed: No Necrotic Quality: Eschar Fat Layer Exposed: No Tendon Exposed: No Natalie RimaHOPKINS, Natalie Y. ([id number]) Muscle Exposed: No Joint Exposed: No Bone Exposed: No Limited to Skin Breakdown Periwound Skin Texture Texture Color No Abnormalities Noted: No No Abnormalities Noted: No Callus: No Atrophie Blanche: No Crepitus: No Cyanosis: No Excoriation: No Ecchymosis: No Fluctuance: No Erythema: Yes Friable: No Erythema Location: Circumferential Induration: No Hemosiderin Staining: No Localized Edema: Yes Mottled: No Rash: No Pallor: No Scarring: No Rubor: No Moisture Temperature / Pain No Abnormalities Noted: No Tenderness on Palpation: Yes Dry / Scaly: Yes Maceration: No Moist: No Wound Preparation Ulcer Cleansing: Rinsed/Irrigated with Saline Topical Anesthetic Applied: Other: lidocaine 4%, Electronic Signature(s) Signed: 09/30/2015 3:14:35 PM By: Curtis Sitesorthy, Joanna Entered By: Curtis Sitesorthy, Joanna on 09/30/2015 15:14:35 Natalie RimaHOPKINS, Anatasia Y. ([id number]) -------------------------------------------------------------------------------- Wound Assessment Details Patient Name: Natalie RimaHOPKINS, Natalie Y. Date of Service: 09/30/2015 2:15 PM Medical Record Number: [id number] Patient Account Number: [id number] Date of Birth/Sex:  Mar 21, 1934 (82 [name] Female) Treating RN: Curtis Sitesorthy, Joanna Primary Care Physician: Rolm GalaGrandis, Heidi Other Clinician: Referring Physician: Rolm GalaGrandis, Heidi Treating Physician/Extender: Rudene ReBritto, Errol Weeks in Treatment: 0 Wound Status Wound Number: 4 Primary Arterial Insufficiency Ulcer Etiology: Wound Location: Right Achilles Wound Open Wounding Event: Not Known Status: Date Acquired: 09/02/2015 Comorbid Arrhythmia, Congestive Heart Weeks Of Treatment: 0 History: Failure, Type II Diabetes Clustered Wound: No Pending Amputation On Presentation Photos Photo Uploaded By: Curtis Sitesorthy, Joanna on 09/30/2015 15:25:33 Wound Measurements Length: (cm) 4.6 % Reduction in Width: (cm) 4 % Reduction in Depth: (cm) 0.3 Epithelializat Area: (cm) 14.451 Tunneling: Volume: (cm) 4.335 Undermining: Area: Volume: ion: None No No Wound Description Full  Thickness Without Classification: Exposed Support Structures Diabetic Severity Grade 1 (Wagner): Wound Margin: Flat and Intact Exudate Amount: Medium Exudate Type: Serous Exudate Color: amber Foul Odor After Cleansing: No Wound Bed Granulation Amount: Small (1-33%) GLORIANNE, PROCTOR ([id number]) Granulation Quality: Pink Necrotic Amount: Large (67-100%) Necrotic Quality: Eschar, Adherent Slough Periwound Skin Texture Texture Color No Abnormalities Noted: No No Abnormalities Noted: No Callus: No Atrophie Blanche: No Crepitus: No Cyanosis: No Excoriation: No Ecchymosis: No Fluctuance: No Erythema: Yes Friable: No Erythema Location: Circumferential Induration: No Hemosiderin Staining: No Localized Edema: Yes Mottled: No Rash: No Pallor: No Scarring: No Rubor: No Moisture Temperature / Pain No Abnormalities Noted: No Tenderness on Palpation: Yes Dry / Scaly: Yes Maceration: No Moist: No Wound Preparation Ulcer Cleansing: Rinsed/Irrigated with Saline Topical Anesthetic Applied: Other: lidocaine 4%, Electronic  Signature(s) Signed: 09/30/2015 3:15:54 PM By: Curtis Sites Entered By: Curtis Sites on 09/30/2015 15:15:54 [name] ([id number]) -------------------------------------------------------------------------------- Wound Assessment Details Patient Name: [name] Date of Service: 09/30/2015 2:15 PM Medical Record Number: [id number] Patient Account Number: [id number] Date of Birth/Sex: 03/17/1934 (82 [name] Female) Treating RN: Curtis Sites Primary Care Physician: [name] Other Clinician: Referring Physician: [name] Treating Physician/Extender: Rudene Re in Treatment: 0 Wound Status Wound Number: 5 Primary Arterial Insufficiency Ulcer Etiology: Wound Location: Right Lower Leg - Lateral Wound Open Wounding Event: Not Known Status: Date Acquired: 09/02/2015 Comorbid Arrhythmia, Congestive Heart Weeks Of Treatment: 0 History: Failure, Type II Diabetes Clustered Wound: No Pending Amputation On Presentation Photos Photo Uploaded By: Curtis Sites on 09/30/2015 15:25:58 Wound Measurements Length: (cm) 18.5 Width: (cm) 6 Depth: (cm) 0.2 Area: (cm) 87.179 Volume: (cm) 17.436 % Reduction in Area: % Reduction in Volume: Epithelialization: Small (1-33%) Tunneling: No Undermining: No Wound Description Full Thickness Without Classification: Exposed Support Structures Diabetic Severity Grade 1 (Wagner): Wound Margin: Flat and Intact Exudate Amount: Medium Exudate Type: Serous Exudate Color: amber Foul Odor After Cleansing: No Wound Bed Granulation Amount: None Present (0%) Exposed Structure ANQUINETTE, PIERRO ([id number]) Necrotic Amount: Large (67-100%) Fascia Exposed: No Necrotic Quality: Eschar, Adherent Slough Fat Layer Exposed: No Tendon Exposed: No Muscle Exposed: No Joint Exposed: No Bone Exposed: No Limited to Skin Breakdown Periwound Skin Texture Texture Color No Abnormalities Noted: No No Abnormalities Noted:  No Callus: No Atrophie Blanche: No Crepitus: No Cyanosis: No Excoriation: No Ecchymosis: No Fluctuance: No Erythema: Yes Friable: No Erythema Location: Circumferential Induration: No Hemosiderin Staining: No Localized Edema: Yes Mottled: No Rash: No Pallor: No Scarring: No Rubor: No Moisture Temperature / Pain No Abnormalities Noted: No Tenderness on Palpation: Yes Dry / Scaly: Yes Maceration: No Moist: Yes Wound Preparation Ulcer Cleansing: Rinsed/Irrigated with Saline Topical Anesthetic Applied: Other: lidocaine 4%, Electronic Signature(s) Signed: 09/30/2015 3:18:23 PM By: Curtis Sites Entered By: Curtis Sites on 09/30/2015 15:18:23 [name] ([id number]) -------------------------------------------------------------------------------- Wound Assessment Details Patient Name: [name] Date of Service: 09/30/2015 2:15 PM Medical Record Number: [id number] Patient Account Number: [id number] Date of Birth/Sex: Oct 06, 1933 (82 [name] Female) Treating RN: Curtis Sites Primary Care Physician: [name] Other Clinician: Referring Physician: [name] Treating Physician/Extender: Rudene Re in Treatment: 0 Wound Status Wound Number: 6 Primary Trauma, Other Etiology: Wound Location: Left Abdomen - Lower Quadrant Wound Open Status: Wounding Event: Trauma Comorbid Arrhythmia, Congestive Heart Date Acquired: 06/10/2015 History: Failure, Type II Diabetes Weeks Of Treatment: 0 Clustered Wound: No Photos Photo Uploaded By: Curtis Sites on 09/30/2015 15:25:58 Wound Measurements Length: (cm) 3 % Reduction in A Width: (cm) 10.6 %  Reduction in V Depth: (cm) 0.1 Epithelializatio Area: (cm) 24.976 Tunneling: Volume: (cm) 2.498 Undermining: rea: olume: n: None No No Wound Description Classification: Unclassifiable Wound Margin: Flat and Intact Exudate Amount: None Present Foul Odor After Cleansing: No Wound Bed Granulation  Amount: None Present (0%) Exposed Structure Necrotic Amount: Large (67-100%) Fascia Exposed: No Necrotic Quality: Eschar Fat Layer Exposed: No Tendon Exposed: No Muscle Exposed: No OAKLYNN, STIERWALT ([id number]) Joint Exposed: No Bone Exposed: No Limited to Skin Breakdown Periwound Skin Texture Texture Color No Abnormalities Noted: No No Abnormalities Noted: No Callus: No Atrophie Blanche: No Crepitus: No Cyanosis: No Excoriation: No Ecchymosis: No Fluctuance: No Erythema: Yes Friable: No Erythema Location: Circumferential Induration: No Hemosiderin Staining: No Localized Edema: Yes Mottled: No Rash: No Pallor: No Scarring: No Rubor: No Moisture Temperature / Pain No Abnormalities Noted: No Tenderness on Palpation: Yes Dry / Scaly: No Maceration: No Moist: No Wound Preparation Ulcer Cleansing: Rinsed/Irrigated with Saline Topical Anesthetic Applied: Other: lidocaine 4%, Electronic Signature(s) Signed: 09/30/2015 3:19:56 PM By: Curtis Sites Entered By: Curtis Sites on 09/30/2015 15:19:56 [name] ([id number]) -------------------------------------------------------------------------------- Wound Assessment Details Patient Name: [name] Date of Service: 09/30/2015 2:15 PM Medical Record Number: [id number] Patient Account Number: [id number] Date of Birth/Sex: May 04, 1934 (82 [name] Female) Treating RN: Curtis Sites Primary Care Physician: [name] Other Clinician: Referring Physician: [name] Treating Physician/Extender: Rudene Re in Treatment: 0 Wound Status Wound Number: 7 Primary Trauma, Other Etiology: Wound Location: Abdomen - midline - Distal Wound Open Wounding Event: Trauma Status: Date Acquired: 08/12/2015 Comorbid Arrhythmia, Congestive Heart Weeks Of Treatment: 0 History: Failure, Type II Diabetes Clustered Wound: No Photos Photo Uploaded By: Curtis Sites on 09/30/2015 15:26:11 Wound  Measurements Length: (cm) 1.6 % Reduction in A Width: (cm) 2.1 % Reduction in V Depth: (cm) 0.2 Epithelializatio Area: (cm) 2.639 Tunneling: Volume: (cm) 0.528 Undermining: rea: olume: n: None No No Wound Description Classification: Unclassifiable Wound Margin: Flat and Intact Exudate Amount: Medium Exudate Type: Serous Exudate Color: amber Foul Odor After Cleansing: No Wound Bed Granulation Amount: None Present (0%) Exposed Structure Necrotic Amount: Large (67-100%) Fascia Exposed: No Necrotic Quality: Eschar, Adherent Slough Fat Layer Exposed: No Tendon Exposed: No SHERRINA, ZAUGG ([id number]) Muscle Exposed: No Joint Exposed: No Bone Exposed: No Limited to Skin Breakdown Periwound Skin Texture Texture Color No Abnormalities Noted: No No Abnormalities Noted: No Callus: No Atrophie Blanche: No Crepitus: No Cyanosis: No Excoriation: No Ecchymosis: No Fluctuance: No Erythema: Yes Friable: No Erythema Location: Circumferential Induration: No Hemosiderin Staining: No Localized Edema: No Mottled: No Rash: No Pallor: No Scarring: No Rubor: No Moisture Temperature / Pain No Abnormalities Noted: No Tenderness on Palpation: Yes Dry / Scaly: No Maceration: No Moist: No Wound Preparation Ulcer Cleansing: Rinsed/Irrigated with Saline Topical Anesthetic Applied: Other: lidocaine 4%, Electronic Signature(s) Signed: 09/30/2015 3:21:34 PM By: Curtis Sites Entered By: Curtis Sites on 09/30/2015 15:21:34 [name] ([id number]) -------------------------------------------------------------------------------- Vitals Details Patient Name: [name] Date of Service: 09/30/2015 2:15 PM Medical Record Number: [id number] Patient Account Number: [id number] Date of Birth/Sex: [birth year] (82 [name] Female) Treating RN: Curtis Sites Primary Care Physician: [name] Other Clinician: Referring Physician: [name] Treating  Physician/Extender: Rudene Re in Treatment: 0 Vital Signs Time Taken: 14:30 Temperature (F): 97.7 Height (in): 65 Pulse (bpm): 70 Source: Stated Respiratory Rate (breaths/min): 18 Weight (lbs): 159 Blood Pressure (mmHg): 119/46 Source: Stated Reference Range: 80 - 120 mg / dl Body Mass Index (BMI): 26.5 Electronic Signature(s) Signed: 09/30/2015  4:21:47 PM By: Curtis Sites Entered By: Curtis Sites on 09/30/2015 14:32:43

## 2015-09-30 NOTE — Progress Notes (Addendum)
AMBREEN, [name] ([id number]) Visit Report for 09/30/2015 Chief Complaint Document Details Patient Name: [name], [name]. Date of Service: 09/30/2015 2:15 PM Medical Record Number: [id number] Patient Account Number: [id number] Date of Birth/Sex: [birth year] ([name]) Treating RN: Curtis Sites Primary Care Physician: [name] Other Clinician: Referring Physician: [name] Treating Physician/Extender: Rudene Re in Treatment: 0 Information Obtained from: Patient Chief Complaint Patients presents for treatment of an open diabetic ulcer, arterial ulcer and pressure ulcers to the right lower extremities which is a complex etiology for the last several months. He also has ulcers on her abdomen in the left lower quadrant and left suprapubic area which she's had for about 4 months. Electronic Signature(s) Signed: 09/30/2015 4:04:33 PM By: Evlyn Kanner MD, FACS Entered By: Evlyn Kanner on 09/30/2015 16:04:33 [name] ([id number]) -------------------------------------------------------------------------------- Debridement Details Patient Name: [name] Date of Service: 09/30/2015 2:15 PM Medical Record Number: [id number] Patient Account Number: [id number] Date of Birth/Sex: [birth year] ([name]) Treating RN: Curtis Sites Primary Care Physician: [name] Other Clinician: Referring Physician: [name] Treating Physician/Extender: Rudene Re in Treatment: 0 Debridement Performed for Wound #6 Left Abdomen - Lower Quadrant Assessment: Performed By: Physician Evlyn Kanner, MD Debridement: Debridement Pre-procedure Yes Verification/Time Out Taken: Start Time: 15:45 Pain Control: Lidocaine 4% Topical Solution Level: Skin/Subcutaneous Tissue Total Area Debrided (L x 3 (cm) x 3 (cm) = 9 (cm) W): Tissue and other Viable, Non-Viable, Fibrin/Slough, Skin, Subcutaneous material debrided: Instrument: Forceps,  Scissors Bleeding: None End Time: 15:46 Procedural Pain: 2 Post Procedural Pain: 4 Response to Treatment: Procedure was tolerated well Post Debridement Measurements of Total Wound Length: (cm) 3 Width: (cm) 10.6 Depth: (cm) 0.1 Volume: (cm) 2.498 Post Procedure Diagnosis Same as Pre-procedure Electronic Signature(s) Signed: 09/30/2015 4:07:57 PM By: Evlyn Kanner MD, FACS Signed: 09/30/2015 4:21:47 PM By: Curtis Sites Entered By: Evlyn Kanner on 09/30/2015 16:07:57 [name] ([id number]) -------------------------------------------------------------------------------- Debridement Details Patient Name: [name]. Date of Service: 09/30/2015 2:15 PM Medical Record Number: [id number] Patient Account Number: [id number] Date of Birth/Sex: 03/20/1934 (83 y.o. Female) Treating RN: Curtis Sites Primary Care Physician: [name] Other Clinician: Referring Physician: [name] Treating Physician/Extender: Rudene Re in Treatment: 0 Debridement Performed for Wound #7 Distal Abdomen - midline Assessment: Performed By: Physician Evlyn Kanner, MD Debridement: Debridement Pre-procedure Yes Verification/Time Out Taken: Start Time: 15:45 Pain Control: Lidocaine 4% Topical Solution Level: Skin/Subcutaneous Tissue Total Area Debrided (L x 1.6 (cm) x 2.1 (cm) = 3.36 (cm) W): Tissue and other Viable, Non-Viable, Fibrin/Slough, Skin, Subcutaneous material debrided: Instrument: Forceps, Scissors Bleeding: None End Time: 15:48 Procedural Pain: 2 Post Procedural Pain: 4 Response to Treatment: Procedure was tolerated well Post Debridement Measurements of Total Wound Length: (cm) 1.6 Width: (cm) 2.1 Depth: (cm) 0.2 Volume: (cm) 0.528 Post Procedure Diagnosis Same as Pre-procedure Electronic Signature(s) Signed: 09/30/2015 4:08:20 PM By: Evlyn Kanner MD, FACS Signed: 09/30/2015 4:21:47 PM By: Curtis Sites Entered By: Evlyn Kanner on 09/30/2015  16:08:19 [name] ([id number]) -------------------------------------------------------------------------------- HPI Details Patient Name: [name] Date of Service: 09/30/2015 2:15 PM Medical Record Number: [id number] Patient Account Number: [id number] Date of Birth/Sex: [birth year] ([name]) Treating RN: Curtis Sites Primary Care Physician: [name] Other Clinician: Referring Physician: [name] Treating Physician/Extender: Rudene Re in Treatment: 0 History of Present Illness Location: several wounds on the right lower extremity including her right healed right posterior ankle and right lower third of the leg. She also has wounds on her  left lower quadrant of the abdomen and the suprapubic area. Quality: Patient reports experiencing a sharp pain to affected area(s). Severity: Patient states wound are getting worse. Duration: Patient has had the wound for > 3 months prior to seeking treatment at the wound center Timing: Pain in wound is Intermittent (comes and goes Context: The wound appeared gradually over time Modifying Factors: Other treatment(s) tried include:he simply been admitted to the hospital 2 weeks ago and has had procedures done on her right lower extremity and had a blockage which we are trying to get some notes Associated Signs and Symptoms: Patient reports having increase discharge. HPI Description: 82 year old patient who is known to be diabetic, was referred to [place] by Dr. Gavin Potters for a right heel ulceration which she's had for a while. She was recently in hospital for a pneumonia and at that time and got delirious and was disoriented and sometime during this time developed a stage II ulcer on her right heel. Her past medical history is significant for bilateral pneumonia which was treated with injectable antibiotics and then to oral Levaquin which he has completed. She also has acute on chronic diastolic CHF, acute on chronic  respiratory failure, end-stage renal disease on hemodialysis, atrial fibrillation, recent stroke, diabetes mellitus. the patient and her son are poor historians but from what I understand she was admitted to the hospital with an acute vascular compromise of her right lower extremity and Dr. Wyn Quaker has done a surgical procedure and we are trying to obtain these notes. There are also some vascular workup done and we will try and obtain these notes. the injury to the left lower quadrant of abdomen and the suprapubic area have been there due to a bruise and have been there for several months and no intervention has been done. Electronic Signature(s) Signed: 09/30/2015 4:06:44 PM By: Evlyn Kanner MD, FACS Previous Signature: 09/30/2015 1:55:34 PM Version By: Evlyn Kanner MD, FACS Entered By: Evlyn Kanner on 09/30/2015 16:06:44 [name] ([id number]) -------------------------------------------------------------------------------- Physical Exam Details Patient Name: [name] Date of Service: 09/30/2015 2:15 PM Medical Record Number: [id number] Patient Account Number: [id number] Date of Birth/Sex: 13-Sep-1933 ([name]) Treating RN: Curtis Sites Primary Care Physician: [name] Other Clinician: Referring Physician: [name] Treating Physician/Extender: Rudene Re in Treatment: 0 Constitutional . Pulse regular. Respirations normal and unlabored. Afebrile. . Eyes Nonicteric. Reactive to light. Ears, Nose, Mouth, and Throat Lips, teeth, and gums WNL.Marland Kitchen Moist mucosa without lesions. Neck supple and nontender. No palpable supraclavicular or cervical adenopathy. Normal sized without goiter. Respiratory WNL. No retractions.. Cardiovascular Pedal Pulses WNL.both lower extremity Dopplers were noncompressible. No clubbing, cyanosis or edema. Gastrointestinal (GI) Abdomen without masses or tenderness.. No liver or spleen enlargement or tenderness.. Lymphatic No  adneopathy. No adenopathy. No adenopathy. Musculoskeletal Adexa without tenderness or enlargement.. Digits and nails w/o clubbing, cyanosis, infection, petechiae, ischemia, or inflammatory conditions.. Integumentary (Hair, Skin) No suspicious lesions. No crepitus or fluctuance. No peri-wound warmth or erythema. No masses.Marland Kitchen Psychiatric Judgement and insight Intact.. No evidence of depression, anxiety, or agitation.. Notes she has got a significant wound with anoscar on the left lower quadrant of abdomen and the suprapubic area. The suprapubic area was sharply dissected with forceps and scissors and all the necrotic debris removed down to the subcutaneous tissue. The left lower quadrant does not have much loose eschar but some of it was partially removed but most of it is extremely tender and deep and could not be removed by sharp dissection.  Her right lower extremity has got several superficial ulcerations on the right anterior lateral part of a cough and there is a unstageable pressure injury to her right Achilles tendon region. Her right heel also has a unstageable pressure ulcer on the right heel. 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 Electronic Signature(s) Signed: 09/30/2015 4:10:44 PM By: Evlyn Kanner MD, FACS ERNESTINE, ROHMAN ([id number]) Entered By: Evlyn Kanner on 09/30/2015 16:10:44 [name] ([id number]) -------------------------------------------------------------------------------- Physician Orders Details Patient Name: LUDA, CHARBONNEAU. Date of Service: 09/30/2015 2:15 PM Medical Record Number: [id number] Patient Account Number: [id number] Date of Birth/Sex: 30-Mar-1934 ([name]) Treating RN: Leonard Downing Primary Care Physician: [name] Other Clinician: Referring Physician: [name] Treating Physician/Extender: Rudene Re in Treatment: 0 Verbal / Phone Orders: Yes Clinician: Leonard Downing Read Back and Verified:  Yes Diagnosis Coding Wound Cleansing Wound #1 Right Toe Third o Cleanse wound with mild soap and water Wound #2 Right Toe Fourth o Cleanse wound with mild soap and water Wound #3 Right Calcaneus o Cleanse wound with mild soap and water Wound #4 Right Achilles o Cleanse wound with mild soap and water Wound #5 Right,Lateral Lower Leg o Cleanse wound with mild soap and water Wound #6 Left Abdomen - Lower Quadrant o Cleanse wound with mild soap and water Wound #7 Distal Abdomen - midline o Cleanse wound with mild soap and water Primary Wound Dressing Wound #1 Right Toe Third o Other: - betadine paint Wound #2 Right Toe Fourth o Other: - betadine paint Wound #3 Right Calcaneus o Other: - betadine paint Wound #4 Right Achilles o Aquacel Ag Wound #5 Right,Lateral Lower Leg o Aquacel Ag LASHONNE, SHULL ([id number]) Wound #6 Left Abdomen - Lower Quadrant o Santyl Ointment Wound #7 Distal Abdomen - midline o Santyl Ointment Secondary Dressing Wound #1 Right Toe Third o Dry Gauze Wound #2 Right Toe Fourth o Dry Gauze Wound #3 Right Calcaneus o Dry Gauze Wound #4 Right Achilles o Gauze and Kerlix/Conform Wound #5 Right,Lateral Lower Leg o Gauze and Kerlix/Conform Wound #6 Left Abdomen - Lower Quadrant o Dry Gauze Wound #7 Distal Abdomen - midline o Dry Gauze Dressing Change Frequency Wound #1 Right Toe Third o Change dressing every day. Wound #2 Right Toe Fourth o Change dressing every day. Wound #3 Right Calcaneus o Change dressing every day. Wound #6 Left Abdomen - Lower Quadrant o Change dressing every day. Wound #7 Distal Abdomen - midline o Change dressing every day. Wound #4 Right Achilles o Change dressing every other day. Wound #5 Right,Lateral Lower Leg NYHLA, [place]Y ([id number]) o Change dressing every other day. Follow-up Appointments Wound #1 Right Toe Third o Return Appointment in 1  week. Wound #2 Right Toe Fourth o Return Appointment in 1 week. Wound #3 Right Calcaneus o Return Appointment in 1 week. Wound #4 Right Achilles o Return Appointment in 1 week. Wound #5 Right,Lateral Lower Leg o Return Appointment in 1 week. Wound #6 Left Abdomen - Lower Quadrant o Return Appointment in 1 week. Wound #7 Distal Abdomen - midline o Return Appointment in 1 week. Edema Control o Elevate legs to the level of the heart and pump ankles as often as possible Off-Loading Wound #3 Right Calcaneus o Other: - heel protector Electronic Signature(s) Signed: 09/30/2015 4:16:10 PM By: Evlyn Kanner MD, FACS Signed: 09/30/2015 4:34:19 PM By: Lucrezia Starch RN, [place] Entered By: Lucrezia Starch RN, Send[place] on 09/30/2015 15:55:08 [name] ([id number]) -------------------------------------------------------------------------------- Problem List Details Patient  Name: TYMIA, STREB. Date of Service: 09/30/2015 2:15 PM Medical Record Number: [id number] Patient Account Number: [id number] Date of Birth/Sex: 04-12-1934 (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 S30.1XXA Contusion of abdominal wall, initial encounter 09/30/2015 Yes S31.104A Unspecified open wound of abdominal wall, left lower 09/30/2015 Yes quadrant without penetration into peritoneal cavity, initial encounter L89.610 Pressure ulcer of right heel, unstageable 09/30/2015 Yes Inactive Problems Resolved Problems Electronic  Signature(s) Signed: 09/30/2015 4:03:31 PM By: Evlyn Kanner MD, FACS Previous Signature: 09/30/2015 4:03:02 PM Version By: Evlyn Kanner MD, FACS Entered By: Evlyn Kanner on 09/30/2015 16:03:31 [name] ([id number]) -------------------------------------------------------------------------------- Progress Note Details Patient Name: [name] Date of Service: 09/30/2015 2:15 PM Medical Record Number: [id number] Patient Account Number: [id number] Date of Birth/Sex: [birth year] ([name]) Treating RN: Curtis Sites Primary Care Physician: [name] Other Clinician: Referring Physician: [name] Treating Physician/Extender: Rudene Re in Treatment: 0 Subjective Chief Complaint Information obtained from Patient Patients presents for treatment of an open diabetic ulcer, arterial ulcer and pressure ulcers to the right lower extremities which is a complex etiology for the last several months. He also has ulcers on her abdomen in the left lower quadrant and left suprapubic area which she's had for about 4 months. History of Present Illness (HPI) The following HPI elements were documented for the patient's wound: Location: several wounds on the right lower extremity including her right healed right posterior ankle and right lower third of the leg. She also has wounds on her left lower quadrant of the abdomen and the suprapubic area. Quality: Patient reports experiencing a sharp pain to affected area(s). Severity: Patient states wound are getting worse. Duration: Patient has had the wound for > 3 months prior to seeking treatment at the wound center Timing: Pain in wound is Intermittent (comes and goes Context: The wound appeared gradually over time Modifying Factors: Other treatment(s) tried include:he simply been admitted to the hospital 2 weeks ago and has had procedures done on her right lower extremity and had a blockage which we are trying to get some  notes Associated Signs and Symptoms: Patient reports having increase discharge. 82 year old patient who is known to be diabetic, was referred to [place] by Dr. Gavin Potters for a right heel ulceration which she's had for a while. She was recently in hospital for a pneumonia and at that time and got delirious and was disoriented and sometime during this time developed a stage II ulcer on her right heel. Her past medical history is significant for bilateral pneumonia which was treated with injectable antibiotics and then to oral Levaquin which he has completed. She also has acute on chronic diastolic CHF, acute on chronic respiratory failure, end-stage renal disease on hemodialysis, atrial fibrillation, recent stroke, diabetes mellitus. the patient and her son are poor historians but from what I understand she was admitted to the hospital with an acute vascular compromise of her right lower extremity and Dr. Wyn Quaker has done a surgical procedure and we are trying to obtain these notes. There are also some vascular workup done and we will try and obtain these notes. the injury  to the left lower quadrant of abdomen and the suprapubic area have been there due to a bruise and have been there for several months and no intervention has been done. Wound History Patient reportedly has not tested positive for osteomyelitis. Patient reportedly has not had testing performed Natalie RimaHOPKINS, Starlee Y. ([id number]) to evaluate circulation in the legs. Patient History Information obtained from Patient. Allergies codeine, Iodinated Contrast Media - Oral and IV Dye, oxycodone, quinine Social History Never smoker, Marital Status - Widowed, Alcohol Use - Never, Drug Use - No History, Caffeine Use - Daily. Medical History Cardiovascular Patient has history of Arrhythmia - a fib, Congestive Heart Failure Endocrine Patient has history of Type II Diabetes Patient is treated with Insulin. Hospitalization/Surgery History - 09/16/2015,  [facility], DVT. Medical And Surgical History Notes Respiratory chronic resp failure Neurologic CVA in January Review of Systems (ROS) Constitutional Symptoms (General Health) The patient has no complaints or symptoms. Eyes The patient has no complaints or symptoms. Ear/Nose/Mouth/Throat The patient has no complaints or symptoms. Hematologic/Lymphatic The patient has no complaints or symptoms. Respiratory The patient has no complaints or symptoms. Cardiovascular The patient has no complaints or symptoms. Gastrointestinal The patient has no complaints or symptoms. Genitourinary Complains or has symptoms of Kidney failure/ Dialysis - CKD om HD. Immunological The patient has no complaints or symptoms. Integumentary (Skin) The patient has no complaints or symptoms. Musculoskeletal The patient has no complaints or symptoms. Natalie RimaHOPKINS, Samaiyah Y. ([id number]) Neurologic The patient has no complaints or symptoms. Oncologic The patient has no complaints or symptoms. Psychiatric The patient has no complaints or symptoms. Medications EMLA topical cream topical apply to port site as needed omega 3 fatty acids oral 1 1 capsule oral daily acetaminophen 500 mg tablet oral 2 2 tablet oral two times daily tramadol 50 mg tablet oral 1 1 tablet oral every six hours as needed warfarin 4 mg tablet oral 1 1 tablet oral every evening but Monday gabapentin 100 mg capsule oral 1 1 capsule oral every Sunday, Monday, Wednesday, Friday gabapentin 300 mg capsule oral 1 1 capsule oral in the evening every Tuesday, Thursday, Saturday Tubersol 5 tub. unit/0.1 mL intradermal injection solution intradermal solution intradermal 0.1 mL. intradermally for 14 days Benadryl 25 mg capsule oral 1 1 capsule oral every six hours as needed pravastatin 20 mg tablet oral 1 1 tablet oral daily Proventil HFA 90 mcg/actuation aerosol inhaler inhalation HFA aerosol inhaler inhalation two puffs every six hours as  needed diltiazem ER 120 mg capsule,extended release oral 1 1 capsule, extended release oral daily calcium acetate 667 mg (169 mg calcium)/5 mL oral solution oral solution oral 10 mL. three times daily with meals ranitidine 150 mg tablet oral 1 1 tablet oral two times daily Levemir 100 unit/mL subcutaneous solution subcutaneous solution subcutaneous inject 11 units two times daily Novolog 100 unit/mL subcutaneous solution subcutaneous solution subcutaneous per sliding scale bisacodyl 5 mg tablet oral 2 2 tablet oral every 12 hours as needed docusate sodium 100 mg capsule oral 2 2 capsule oral every 12 hours as needed Senokot-S 8.6 mg-50 mg tablet oral 1 1 tablet oral every 12 hours as needed furosemide 80 mg tablet oral 1 1 tablet oral two times daily aspirin 81 mg tablet,delayed release oral 1 1 tablet,delayed release (DR/EC) oral daily Paxil 40 mg tablet oral 1 1 tablet oral nightly nystatin 100,000 unit/gram topical powder topical powder topical apply every 12 hours as needed biotin 1 mg capsule oral 1 1 capsule oral daily Dialyvite Supreme D  3 mg-2,000 unit tablet oral 1 1 tablet oral daily Objective Constitutional Pulse regular. Respirations normal and unlabored. Afebrile. LETESHA, KLECKER ([id number]) Vitals Time Taken: 2:30 PM, Height: 65 in, Source: Stated, Weight: 159 lbs, Source: Stated, BMI: 26.5, Temperature: 97.7 F, Pulse: 70 bpm, Respiratory Rate: 18 breaths/min, Blood Pressure: 119/46 mmHg. Eyes Nonicteric. Reactive to light. Ears, Nose, Mouth, and Throat Lips, teeth, and gums WNL.Marland Kitchen Moist mucosa without lesions. Neck supple and nontender. No palpable supraclavicular or cervical adenopathy. Normal sized without goiter. Respiratory WNL. No retractions.. Cardiovascular Pedal Pulses WNL.both lower extremity Dopplers were noncompressible. No clubbing, cyanosis or edema. Gastrointestinal (GI) Abdomen without masses or tenderness.. No liver or spleen enlargement or  tenderness.. Lymphatic No adneopathy. No adenopathy. No adenopathy. Musculoskeletal Adexa without tenderness or enlargement.. Digits and nails w/o clubbing, cyanosis, infection, petechiae, ischemia, or inflammatory conditions.Marland Kitchen Psychiatric Judgement and insight Intact.. No evidence of depression, anxiety, or agitation.. General Notes: she has got a significant wound with an scar on the left lower quadrant of abdomen and the suprapubic area. The suprapubic area was sharply dissected with forceps and scissors and all the necrotic debris removed down to the subcutaneous tissue. The left lower quadrant does not have much loose eschar but some of it was partially removed but most of it is extremely tender and deep and could not be removed by sharp dissection. Her right lower extremity has got several superficial ulcerations on the right anterior lateral part of a cough and there is a unstageable pressure injury to her right Achilles tendon region. Her right heel also has a unstageable pressure ulcer on the right heel. 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 Integumentary (Hair, Skin) No suspicious lesions. No crepitus or fluctuance. No peri-wound warmth or erythema. No masses.. Wound #1 status is Open. Original cause of wound was Not Known. The wound is located on the Right Toe Third. The wound measures 1.1cm length x 1cm width x 0.1cm depth; 0.864cm^2 area and 0.086cm^3 volume. The wound is limited to skin breakdown. There is no tunneling or undermining noted. There is a none present amount of drainage noted. The wound margin is flat and intact. There is no granulation within the wound bed. There is a large (67-100%) amount of necrotic tissue within the wound bed including FARREN, LANDA. ([id number]) Eschar. The periwound skin appearance exhibited: Localized Edema, Dry/Scaly, Erythema. The periwound skin appearance did not exhibit: Callus, Crepitus, Excoriation, Fluctuance,  Friable, Induration, Rash, Scarring, Maceration, Moist, Atrophie Blanche, Cyanosis, Ecchymosis, Hemosiderin Staining, Mottled, Pallor, Rubor. The surrounding wound skin color is noted with erythema which is circumferential. The periwound has tenderness on palpation. Wound #2 status is Open. Original cause of wound was Not Known. The wound is located on the Right Toe Fourth. The wound measures 3.1cm length x 2cm width x 0.1cm depth; 4.869cm^2 area and 0.487cm^3 volume. The wound is limited to skin breakdown. There is no tunneling or undermining noted. There is a medium amount of serous drainage noted. The wound margin is flat and intact. There is small (1-33%) pink granulation within the wound bed. There is a large (67-100%) amount of necrotic tissue within the wound bed including Eschar and Adherent Slough. The periwound skin appearance exhibited: Localized Edema, Erythema. The periwound skin appearance did not exhibit: Callus, Crepitus, Excoriation, Fluctuance, Friable, Induration, Rash, Scarring, Dry/Scaly, Maceration, Moist, Atrophie Blanche, Cyanosis, Ecchymosis, Hemosiderin Staining, Mottled, Pallor, Rubor. The surrounding wound skin color is noted with erythema which is circumferential. The periwound has tenderness  on palpation. Wound #3 status is Open. Original cause of wound was Not Known. The wound is located on the Right Calcaneus. The wound measures 1.6cm length x 1.5cm width x 0.1cm depth; 1.885cm^2 area and 0.188cm^3 volume. The wound is limited to skin breakdown. There is no tunneling or undermining noted. There is a none present amount of drainage noted. The wound margin is flat and intact. There is no granulation within the wound bed. There is a large (67-100%) amount of necrotic tissue within the wound bed including Eschar. The periwound skin appearance exhibited: Localized Edema, Dry/Scaly, Erythema. The periwound skin appearance did not exhibit: Callus, Crepitus, Excoriation,  Fluctuance, Friable, Induration, Rash, Scarring, Maceration, Moist, Atrophie Blanche, Cyanosis, Ecchymosis, Hemosiderin Staining, Mottled, Pallor, Rubor. The surrounding wound skin color is noted with erythema which is circumferential. The periwound has tenderness on palpation. Wound #4 status is Open. Original cause of wound was Not Known. The wound is located on the Right Achilles. The wound measures 4.6cm length x 4cm width x 0.3cm depth; 14.451cm^2 area and 4.335cm^3 volume. There is no tunneling or undermining noted. There is a medium amount of serous drainage noted. The wound margin is flat and intact. There is small (1-33%) pink granulation within the wound bed. There is a large (67-100%) amount of necrotic tissue within the wound bed including Eschar and Adherent Slough. The periwound skin appearance exhibited: Localized Edema, Dry/Scaly, Erythema. The periwound skin appearance did not exhibit: Callus, Crepitus, Excoriation, Fluctuance, Friable, Induration, Rash, Scarring, Maceration, Moist, Atrophie Blanche, Cyanosis, Ecchymosis, Hemosiderin Staining, Mottled, Pallor, Rubor. The surrounding wound skin color is noted with erythema which is circumferential. The periwound has tenderness on palpation. Wound #5 status is Open. Original cause of wound was Not Known. The wound is located on the Right,Lateral Lower Leg. The wound measures 18.5cm length x 6cm width x 0.2cm depth; 87.179cm^2 area and 17.436cm^3 volume. The wound is limited to skin breakdown. There is no tunneling or undermining noted. There is a medium amount of serous drainage noted. The wound margin is flat and intact. There is no granulation within the wound bed. There is a large (67-100%) amount of necrotic tissue within the wound bed including Eschar and Adherent Slough. The periwound skin appearance exhibited: Localized Edema, Dry/Scaly, Moist, Erythema. The periwound skin appearance did not exhibit: Callus, Crepitus,  Excoriation, Fluctuance, Friable, Induration, Rash, Scarring, Maceration, Atrophie Blanche, Cyanosis, Ecchymosis, Hemosiderin Staining, Mottled, Pallor, Rubor. The surrounding wound skin color is noted with erythema which is circumferential. The periwound has tenderness on palpation. Wound #6 status is Open. Original cause of wound was Trauma. The wound is located on the Left MALISSIA, RABBANI ([id number]) Abdomen - Lower Quadrant. The wound measures 3cm length x 10.6cm width x 0.1cm depth; 24.976cm^2 area and 2.498cm^3 volume. The wound is limited to skin breakdown. There is no tunneling or undermining noted. There is a none present amount of drainage noted. The wound margin is flat and intact. There is no granulation within the wound bed. There is a large (67-100%) amount of necrotic tissue within the wound bed including Eschar. The periwound skin appearance exhibited: Localized Edema, Erythema. The periwound skin appearance did not exhibit: Callus, Crepitus, Excoriation, Fluctuance, Friable, Induration, Rash, Scarring, Dry/Scaly, Maceration, Moist, Atrophie Blanche, Cyanosis, Ecchymosis, Hemosiderin Staining, Mottled, Pallor, Rubor. The surrounding wound skin color is noted with erythema which is circumferential. The periwound has tenderness on palpation. Wound #7 status is Open. Original cause of wound was Trauma. The wound is located on the Distal Abdomen -  midline. The wound measures 1.6cm length x 2.1cm width x 0.2cm depth; 2.639cm^2 area and 0.528cm^3 volume. The wound is limited to skin breakdown. There is no tunneling or undermining noted. There is a medium amount of serous drainage noted. The wound margin is flat and intact. There is no granulation within the wound bed. There is a large (67-100%) amount of necrotic tissue within the wound bed including Eschar and Adherent Slough. The periwound skin appearance exhibited: Erythema. The periwound skin appearance did not exhibit: Callus,  Crepitus, Excoriation, Fluctuance, Friable, Induration, Localized Edema, Rash, Scarring, Dry/Scaly, Maceration, Moist, Atrophie Blanche, Cyanosis, Ecchymosis, Hemosiderin Staining, Mottled, Pallor, Rubor. The surrounding wound skin color is noted with erythema which is circumferential. The periwound has tenderness on palpation. 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 She also has a fairly deep subcutaneous injury to her anterior abdomen wall in the left lower quadrant and suprapubic area which has been there for 4 months. Several reports are still pending and we will await these reports before making further recommendation. I have recommended: TEODORA, BAUMGARTEN ([id number]) 1. Santyl ointment to her abdominal wall wounds 2. Betadine paint to the toes on her right foot which have dry gangrene and to the right heel which is of indeterminate depth pressure ulcer 3. Aquacel Ag to the right lower extremity and to the right Achilles tendon 4. Obtain notes of a vascular procedure and the investigations done at Dr. Driscilla Grammes office. 5. more detailed notes from her endocrinologist and cardiologist discussed with the patient and her son that as we get more data and we will alter her treatment appropriately Procedures Wound #6 Wound #6 is a Trauma, Other located on the Left  Abdomen - Lower Quadrant . There was a Skin/Subcutaneous Tissue Debridement ([id number]) debridement with total area of 9 sq cm performed by Evlyn Kanner, MD. with the following instrument(s): Forceps and Scissors to remove Viable and Non- Viable tissue/material including Fibrin/Slough, Skin, and Subcutaneous after achieving pain control using Lidocaine 4% Topical Solution. A time out was conducted prior to the start of the procedure. There was no bleeding. The procedure was tolerated well with a pain level of 2 throughout and a pain level of 4 following the procedure. Post Debridement Measurements: 3cm length x 10.6cm width x 0.1cm depth; 2.498cm^3 volume. Post procedure Diagnosis Wound #6: Same as Pre-Procedure Wound #7 Wound #7 is a Trauma, Other located on the Distal Abdomen - midline . There was a Skin/Subcutaneous Tissue Debridement ([id number]) debridement with total area of 3.36 sq cm performed by Evlyn Kanner, MD. with the following instrument(s): Forceps and Scissors to remove Viable and Non-Viable tissue/material including Fibrin/Slough, Skin, and Subcutaneous after achieving pain control using Lidocaine 4% Topical Solution. A time out was conducted prior to the start of the procedure. There was no bleeding. The procedure was tolerated well with a pain level of 2 throughout and a pain level of 4 following the procedure. Post Debridement Measurements: 1.6cm length x  2.1cm width x 0.2cm depth; 0.528cm^3 volume. Post procedure Diagnosis Wound #7: Same as Pre-Procedure Plan Wound Cleansing: Wound #1 Right Toe Third: Cleanse wound with mild soap and water Wound #2 Right Toe Fourth: Cleanse wound with mild soap and water Wound #3 Right Calcaneus: Cleanse wound with mild soap and water Wound #4 Right Achilles: CHENOA, LUDDY ([id number]) Cleanse wound with mild soap and water Wound #5 Right,Lateral Lower Leg: Cleanse wound with mild soap and water Wound #6 Left Abdomen -  Lower Quadrant: Cleanse wound with mild soap and water Wound #7 Distal Abdomen - midline: Cleanse wound with mild soap and water Primary Wound Dressing: Wound #1 Right Toe Third: Other: - betadine paint Wound #2 Right Toe Fourth: Other: - betadine paint Wound #3 Right Calcaneus: Other: - betadine paint Wound #4 Right Achilles: Aquacel Ag Wound #5 Right,Lateral Lower Leg: Aquacel Ag Wound #6 Left Abdomen - Lower Quadrant: Santyl Ointment Wound #7 Distal Abdomen - midline: Santyl Ointment Secondary Dressing: Wound #1 Right Toe Third: Dry Gauze Wound #2 Right Toe Fourth: Dry Gauze Wound #3 Right Calcaneus: Dry Gauze Wound #4 Right Achilles: Gauze and Kerlix/Conform Wound #5 Right,Lateral Lower Leg: Gauze and Kerlix/Conform Wound #6 Left Abdomen - Lower Quadrant: Dry Gauze Wound #7 Distal Abdomen - midline: Dry Gauze Dressing Change Frequency: Wound #1 Right Toe Third: Change dressing every day. Wound #2 Right Toe Fourth: Change dressing every day. Wound #3 Right Calcaneus: Change dressing every day. Wound #6 Left Abdomen - Lower Quadrant: Change dressing every day. Wound #7 Distal Abdomen - midline: Change dressing every day. Wound #4 Right Achilles: Change dressing every other day. Wound #5 Right,Lateral Lower Leg: DANAH, REINECKE ([id number]) Change dressing every other day. Follow-up Appointments: Wound #1 Right Toe Third: Return Appointment in 1 week. Wound #2 Right Toe Fourth: Return Appointment in 1 week. Wound #3 Right Calcaneus: Return Appointment in 1 week. Wound #4 Right Achilles: Return Appointment in 1 week. Wound #5 Right,Lateral Lower Leg: Return Appointment in 1 week. Wound #6 Left Abdomen - Lower Quadrant: Return Appointment in 1 week. Wound #7 Distal Abdomen - midline: Return Appointment in 1 week. 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 She also has a fairly deep subcutaneous injury to her anterior abdomen wall in the left lower quadrant and suprapubic area which has been there for 4 months. Several reports are still pending and we will await these reports before making further recommendation. I have recommended: 1. Santyl ointment to her abdominal wall wounds 2. Betadine paint to the toes on her right foot which have dry gangrene and to the right heel which is of indeterminate depth pressure ulcer 3. Aquacel Ag to the right lower extremity and to the right Achilles tendon 4. Obtain notes of a vascular procedure and the investigations done at Dr. Driscilla Grammes office. 5. more detailed notes from her endocrinologist and cardiologist discussed with the patient and her son that as we get more data and we will alter her treatment appropriately Electronic Signature(s) Signed: 09/30/2015 4:13:44 PM By: Evlyn Kanner MD, FACS DAMIANA, BERRIAN ([id number]) Entered By: Evlyn Kanner on 09/30/2015 16:13:44 [name] ([id number]) -------------------------------------------------------------------------------- ROS/PFSH Details Patient Name: [name] Date of Service: 09/30/2015 2:15 PM  Medical Record Number: [id number] Patient Account Number: [id number] Date of Birth/Sex: 09-27-1933 (81 y.o. Female) Treating RN: Curtis Sites Primary Care Physician: [name] Other Clinician: Referring Physician: [name] Treating Physician/Extender: Rudene Re in Treatment: 0 Information Obtained From Patient Wound History Do you currently have one or more open woundso Yes Approximately how long have you had your woundso unknown How have you been treating your wound(s) until nowo unknown Has your wound(s) ever healed and then  re-openedo No Have you had any lab work done in the past montho No Have you tested positive for an antibiotic resistant organism (MRSA, VRE)o No Have you tested positive for osteomyelitis (bone infection)o No Have you had any tests for circulation on your legso No Genitourinary Complaints and Symptoms: Positive for: Kidney failure/ Dialysis - CKD om HD Constitutional Symptoms (General Health) Complaints and Symptoms: No Complaints or Symptoms Eyes Complaints and Symptoms: No Complaints or Symptoms Ear/Nose/Mouth/Throat Complaints and Symptoms: No Complaints or Symptoms Hematologic/Lymphatic Complaints and Symptoms: No Complaints or Symptoms Respiratory Complaints and Symptoms: No Complaints or Symptoms Medical HistoryAADHIRA, HEFFERNAN ([id number]) Past Medical History Notes: chronic resp failure Cardiovascular Complaints and Symptoms: No Complaints or Symptoms Medical History: Positive for: Arrhythmia - a fib; Congestive Heart Failure Gastrointestinal Complaints and Symptoms: No Complaints or Symptoms Endocrine Medical History: Positive for: Type II Diabetes Treated with: Insulin Immunological Complaints and Symptoms: No Complaints or Symptoms Integumentary (Skin) Complaints and Symptoms: No Complaints or Symptoms Musculoskeletal Complaints and Symptoms: No Complaints or Symptoms Neurologic Complaints and Symptoms: No Complaints or Symptoms Medical History: Past Medical History Notes: CVA in January Oncologic Complaints and Symptoms: No Complaints or Symptoms CHARITY, TESSIER ([id number]) Psychiatric Complaints and Symptoms: No Complaints or Symptoms Immunizations Immunization Notes: up to date Hospitalization / Surgery History Name of Hospital Purpose of Hospitalization/Surgery Date [facility] DVT 09/16/2015 Family and Social History Never smoker; Marital Status - Widowed; Alcohol Use: Never; Drug Use: No History; Caffeine Use: Daily; Financial  Concerns: No; Food, Clothing or Shelter Needs: No; Support System Lacking: No; Transportation Concerns: No; Advanced Directives: No; Patient does not want information on Advanced Directives Physician Affirmation I have reviewed and agree with the above information. Electronic Signature(s) Signed: 09/30/2015 2:51:58 PM By: Evlyn Kanner MD, FACS Signed: 09/30/2015 4:21:47 PM By: Curtis Sites Entered By: Evlyn Kanner on 09/30/2015 14:51:57 [name] ([id number]) -------------------------------------------------------------------------------- SuperBill Details Patient Name: [name] Date of Service: 09/30/2015 Medical Record Number: [id number] Patient Account Number: [id number] Date of Birth/Sex: 12-Dec-1933 (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 E11.52 Type 2 diabetes mellitus with diabetic peripheral angiopathy with gangrene S30.1XXA Contusion of abdominal wall, initial encounter Unspecified open wound of abdominal wall, left lower quadrant without penetration into S31.104A peritoneal cavity, initial encounter L89.610 Pressure ulcer of right heel, unstageable Facility Procedures CPT4: Description Modifier Quantity Code [id number] - WOUND CARE VISIT-LEV 3 EST PT 1 CPT4: 25366440 11042 - DEB SUBQ TISSUE 20 SQ CM/< 1 ICD-10 Description Diagnosis E11.621 Type 2 diabetes mellitus with foot ulcer S31.104A Unspecified open wound of abdominal wall, left lower quadrant without penetration into peritoneal cavity, initial  encounter S30.1XXA Contusion of abdominal wall, initial encounter Physician Procedures CPT4: Description Modifier Quantity  Code [id number] - WC PHYS LEVEL  4 - NEW PT 25 1 ICD-10 Description Diagnosis E11.621 Type 2 diabetes mellitus with foot ulcer I70.233 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

## 2015-09-30 NOTE — Progress Notes (Signed)
[name], Natalie Y. ([id number]) Visit Report for 09/30/2015 Abuse/Suicide Risk Screen Details Patient Name: [name], Natalie Y. Date of Service: 09/30/2015 2:15 PM Medical Record Number: [id number] Patient Account Number: [id number] Date of Birth/Sex: July 09, 1933 (82 [name] Female) Treating RN: Curtis Sitesorthy, Joanna Primary Care Physician: Rolm GalaGrandis, Heidi Other Clinician: Referring Physician: Rolm GalaGrandis, Heidi Treating Physician/Extender: Rudene ReBritto, Errol Weeks in Treatment: 0 Abuse/Suicide Risk Screen Items Answer ABUSE/SUICIDE RISK SCREEN: Has anyone close to you tried to hurt or harm you recentlyo No Do you feel uncomfortable with anyone in your familyo No Has anyone forced you do things that you didnot want to doo No Do you have any thoughts of harming yourselfo No Patient displays signs or symptoms of abuse and/or neglect. No Electronic Signature(s) Signed: 09/30/2015 4:21:47 PM By: Curtis Sitesorthy, Joanna Entered By: Curtis Sitesorthy, Joanna on 09/30/2015 14:40:16 [name], Natalie Y. ([id number]) -------------------------------------------------------------------------------- Activities of Daily Living Details Patient Name: [name], Natalie Y. Date of Service: 09/30/2015 2:15 PM Medical Record Number: [id number] Patient Account Number: [id number] Date of Birth/Sex: July 09, 1933 (82 [name] Female) Treating RN: Curtis Sitesorthy, Joanna Primary Care Physician: Rolm GalaGrandis, Heidi Other Clinician: Referring Physician: Rolm GalaGrandis, Heidi Treating Physician/Extender: Rudene ReBritto, Errol Weeks in Treatment: 0 Activities of Daily Living Items Answer Activities of Daily Living (Please select one for each item) Drive Automobile Not Able Take Medications Completely Able Use Telephone Completely Able Care for Appearance Completely Able Use Toilet Completely Able Bath / Shower Need Assistance Dress Self Completely Able Feed Self Completely Able Walk Need Assistance Get In / Out Bed Completely Able Housework Need Assistance Prepare Meals Completely  Able Handle Money Completely Able Shop for Self Need Assistance Electronic Signature(s) Signed: 09/30/2015 4:21:47 PM By: Curtis Sitesorthy, Joanna Entered By: Curtis Sitesorthy, Joanna on 09/30/2015 14:40:47 [name], Natalie Y. ([id number]) -------------------------------------------------------------------------------- Education Assessment Details Patient Name: [name], Natalie Y. Date of Service: 09/30/2015 2:15 PM Medical Record Number: [id number] Patient Account Number: [id number] Date of Birth/Sex: July 09, 1933 (82 [name] Female) Treating RN: Curtis Sitesorthy, Joanna Primary Care Physician: Rolm GalaGrandis, Heidi Other Clinician: Referring Physician: Rolm GalaGrandis, Heidi Treating Physician/Extender: Rudene ReBritto, Errol Weeks in Treatment: 0 Primary Learner Assessed: Patient Learning Preferences/Education Level/Primary Language Learning Preference: Explanation, Demonstration Highest Education Level: High School Preferred Language: English Cognitive Barrier Assessment/Beliefs Language Barrier: No Translator Needed: No Memory Deficit: No Emotional Barrier: No Cultural/Religious Beliefs Affecting Medical No Care: Physical Barrier Assessment Impaired Vision: No Impaired Hearing: No Decreased Hand dexterity: No Knowledge/Comprehension Assessment Knowledge Level: Medium Comprehension Level: Medium Ability to understand written Medium instructions: Ability to understand verbal Medium instructions: Motivation Assessment Anxiety Level: Calm Cooperation: Cooperative Education Importance: Acknowledges Need Interest in Health Problems: Asks Questions Perception: Coherent Willingness to Engage in Self- Medium Management Activities: Readiness to Engage in Self- Medium Management Activities: Electronic Signature(s) [name], Milessa Y. ([id number]) Signed: 09/30/2015 4:21:47 PM By: Curtis Sitesorthy, Joanna Entered By: Curtis Sitesorthy, Joanna on 09/30/2015 14:41:10 [name], Natalie Y.  ([id number]) -------------------------------------------------------------------------------- Fall Risk Assessment Details Patient Name: [name], Jenilyn Y. Date of Service: 09/30/2015 2:15 PM Medical Record Number: [id number] Patient Account Number: [id number] Date of Birth/Sex: July 09, 1933 (82 [name] Female) Treating RN: Curtis Sitesorthy, Joanna Primary Care Physician: Rolm GalaGrandis, Heidi Other Clinician: Referring Physician: Rolm GalaGrandis, Heidi Treating Physician/Extender: Rudene ReBritto, Errol Weeks in Treatment: 0 Fall Risk Assessment Items Have you had 2 or more falls in the last 12 monthso 0 No Have you had any fall that resulted in injury in the last 12 monthso 0 No FALL RISK ASSESSMENT: History of falling - immediate or within 3 months 25 Yes Secondary diagnosis 0 No Ambulatory aid None/bed rest/wheelchair/nurse 0 No Crutches/cane/walker 15 Yes Furniture 0  No IV Access/Saline Lock 0 No Gait/Training Normal/bed rest/immobile 0 No Weak 10 Yes Impaired 0 No Mental Status Oriented to own ability 0 Yes Electronic Signature(s) Signed: 09/30/2015 4:21:47 PM By: Curtis Sites Entered By: Curtis Sites on 09/30/2015 14:41:30 Natalie Rima ([id number]) -------------------------------------------------------------------------------- Foot Assessment Details Patient Name: Natalie Rima. Date of Service: 09/30/2015 2:15 PM Medical Record Number: [id number] Patient Account Number: [id number] Date of Birth/Sex: 1934/04/27 (82 [name] Female) Treating RN: Curtis Sites Primary Care Physician: [name] Other Clinician: Referring Physician: [name] Treating Physician/Extender: Rudene Re in Treatment: 0 Foot Assessment Items Site Locations + = Sensation present, - = Sensation absent, C = Callus, U = Ulcer [name] = Redness, W = Warmth, M = Maceration, PU = Pre-ulcerative lesion F = Fissure, S = Swelling, D = Dryness Assessment Right: Left: Other Deformity: No No Prior Foot Ulcer: No No Prior  Amputation: No No Charcot Joint: No No Ambulatory Status: Ambulatory With Help Assistance Device: Wheelchair Gait: Steady Electronic Signature(s) Signed: 09/30/2015 4:21:47 PM By: Curtis Sites Entered By: Curtis Sites on 09/30/2015 15:00:27 Natalie Rima ([id number]) -------------------------------------------------------------------------------- Nutrition Risk Assessment Details Patient Name: Natalie Rima Date of Service: 09/30/2015 2:15 PM Medical Record Number: [id number] Patient Account Number: [id number] Date of Birth/Sex: 09-08-1933 (82 [name] Female) Treating RN: Curtis Sites Primary Care Physician: [name] Other Clinician: Referring Physician: [name] Treating Physician/Extender: Rudene Re in Treatment: 0 Height (in): 65 Weight (lbs): 159 Body Mass Index (BMI): 26.5 Nutrition Risk Assessment Items NUTRITION RISK SCREEN: I have an illness or condition that made me change the kind and/or 0 No amount of food I eat I eat fewer than two meals per day 0 No I eat few fruits and vegetables, or milk products 0 No I have three or more drinks of beer, liquor or wine almost every day 0 No I have tooth or mouth problems that make it hard for me to eat 0 No I don't always have enough money to buy the food I need 0 No I eat alone most of the time 0 No I take three or more different prescribed or over-the-counter drugs a 1 Yes day Without wanting to, I have lost or gained 10 pounds in the last six 0 No months I am not always physically able to shop, cook and/or feed myself 0 No Nutrition Protocols Good Risk Protocol 0 No interventions needed Moderate Risk Protocol Electronic Signature(s) Signed: 09/30/2015 4:21:47 PM By: Curtis Sites Entered By: Curtis Sites on 09/30/2015 14:41:37

## 2015-10-11 ENCOUNTER — Encounter: Payer: Medicare Other | Admitting: Surgery

## 2015-10-11 NOTE — Progress Notes (Addendum)
ETHELYNE, ERICH ([id number]) Visit Report for 10/11/2015 Chief Complaint Document Details Patient Name: [name], [name]. Date of Service: 10/11/2015 9:15 AM Medical Record Patient Account Number: [id number] Number: Afful, RN, BSN, Treating RN: [birth year] (82 y.o. [place] Sink Date of Birth/Sex: Female) Other Clinician: Primary Care Physician: [name] Treating Evlyn Kanner Referring Physician: [name] Physician/Extender: Tania Ade in Treatment: 1 Information Obtained from: Patient Chief Complaint Patients presents for treatment of an open diabetic ulcer, arterial ulcer and pressure ulcers to the right lower extremities which is a complex etiology for the last several months. He also has ulcers on her abdomen in the left lower quadrant and left suprapubic area which she's had for about 4 months. Electronic Signature(s) Signed: 10/11/2015 10:12:46 AM By: Evlyn Kanner MD, FACS Entered By: Evlyn Kanner on 10/11/2015 10:12:46 [name] ([id number]) -------------------------------------------------------------------------------- Debridement Details Patient Name: [name] Date of Service: 10/11/2015 9:15 AM Medical Record Patient Account Number: [id number] Number: Afful, RN, BSN, Treating RN: 09/24/1933 (82 y.o. [place] S[place] Date of Birth/Sex: Female) Other Clinician: Primary Care Physician: [name] Treating Annaleigh Steinmeyer Referring Physician: [name] Physician/Extender: Tania Ade in Treatment: 1 Debridement Performed for Wound #4 Right Achilles Assessment: Performed By: Physician Evlyn Kanner, MD Debridement: Debridement Pre-procedure Yes Verification/Time Out Taken: Start Time: 09:56 Pain Control: Lidocaine 4% Topical Solution Level: Skin/Subcutaneous Tissue Total Area Debrided (L x 3 (cm) x 3 (cm) = 9 (cm) W): Tissue and other Viable, Non-Viable, Fibrin/Slough, Skin, Subcutaneous material debrided: Instrument: Forceps,  Scissors Bleeding: Minimum Hemostasis Achieved: Pressure End Time: 10:00 Procedural Pain: 0 Post Procedural Pain: 0 Response to Treatment: Procedure was tolerated well Post Debridement Measurements of Total Wound Length: (cm) 5.5 Width: (cm) 4.5 Depth: (cm) 0.1 Volume: (cm) 1.944 Post Procedure Diagnosis Same as Pre-procedure Electronic Signature(s) Signed: 10/11/2015 10:12:15 AM By: Evlyn Kanner MD, FACS Signed: 10/11/2015 3:35:42 PM By: Elpidio Eric BSN, RN Entered By: Evlyn Kanner on 10/11/2015 10:12:15 [name] ([id number]) -------------------------------------------------------------------------------- Debridement Details Patient Name: [name]. Date of Service: 10/11/2015 9:15 AM Medical Record Patient Account Number: [id number] Number: Afful, RN, BSN, Treating RN: [birth year] (82 y.o. [place] Sink Date of Birth/Sex: Female) Other Clinician: Primary Care Physician: [name] Treating Ellisa Devivo Referring Physician: [name] Physician/Extender: Tania Ade in Treatment: 1 Debridement Performed for Wound #5 Right,Lateral Lower Leg Assessment: Performed By: Physician Evlyn Kanner, MD Debridement: Debridement Pre-procedure Yes Verification/Time Out Taken: Start Time: 09:54 Pain Control: Lidocaine 4% Topical Solution Level: Skin/Subcutaneous Tissue Total Area Debrided (L x 4 (cm) x 2 (cm) = 8 (cm) W): Tissue and other Viable, Non-Viable, Fibrin/Slough, Subcutaneous material debrided: Instrument: Forceps, Scissors Bleeding: Minimum Hemostasis Achieved: Pressure End Time: 09:56 Procedural Pain: 0 Post Procedural Pain: 0 Response to Treatment: Procedure was tolerated well Post Debridement Measurements of Total Wound Length: (cm) 11 Width: (cm) 3.5 Depth: (cm) 0.2 Volume: (cm) 6.048 Post Procedure Diagnosis Same as Pre-procedure Electronic Signature(s) Signed: 10/11/2015 10:12:40 AM By: Evlyn Kanner MD, FACS Signed: 10/11/2015  3:35:42 PM By: Elpidio Eric BSN, RN Entered By: Evlyn Kanner on 10/11/2015 10:12:39 [name] ([id number]) -------------------------------------------------------------------------------- HPI Details Patient Name: [name] Date of Service: 10/11/2015 9:15 AM Medical Record Patient Account Number: [id number] Number: Afful, RN, BSN, Treating RN: [birth year] (82 y.o.  Sink Date of Birth/Sex: Female) Other Clinician: Primary Care Physician: [name] Treating Evlyn Kanner Referring Physician: [name] Physician/Extender: Weeks in Treatment: 1 History of Present Illness Location: several wounds on the right lower extremity including her right healed right posterior ankle  and right lower third of the leg. She also has wounds on her left lower quadrant of the abdomen and the suprapubic area. Quality: Patient reports experiencing a sharp pain to affected area(s). Severity: Patient states wound are getting worse. Duration: Patient has had the wound for > 3 months prior to seeking treatment at the wound center Timing: Pain in wound is Intermittent (comes and goes Context: The wound appeared gradually over time Modifying Factors: Other treatment(s) tried include:he simply been admitted to the hospital 2 weeks ago and has had procedures done on her right lower extremity and had a blockage which we are trying to get some notes Associated Signs and Symptoms: Patient reports having increase discharge. HPI Description: 82 year old patient who is known to be diabetic, was referred to [place] by Dr. Gavin Potters for a right heel ulceration which she's had for a while. She was recently in hospital for a pneumonia and at that time and got delirious and was disoriented and sometime during this time developed a stage II ulcer on her right heel. Her past medical history is significant for bilateral pneumonia which was treated with injectable antibiotics and then to oral Levaquin  which he has completed. She also has acute on chronic diastolic CHF, acute on chronic respiratory failure, end-stage renal disease on hemodialysis, atrial fibrillation, recent stroke, diabetes mellitus. The patient and her son are poor historians but from what I understand she was admitted to the hospital with an acute vascular compromise of her right lower extremity and Dr. Wyn Quaker has done a surgical procedure and we are trying to obtain these notes. There are also some vascular workup done and we will try and obtain these notes. the injury to the left lower quadrant of abdomen and the suprapubic area have been there due to a bruise and have been there for several months and no intervention has been done. 10/11/2015 -- on review of the electronics records it was noted that the patient was admitted to the hospital on 09/14/2015 with peripheral vascular disease with claudication, end-stage renal disease, pressure ulcer, chronic atrial fibrillation. She was seen by Dr. Wyn Quaker who did her right lower extremity angiogram , angioplasty of the right anterior tibial artery and thrombolysis with TPA of the right popliteal artery, and thrombectomy. She was seen by Dr. Wyn Quaker during this past week and he was pleased with the progress. He did say that if he took her to the operating room for any procedure he would debride the abdominal wound under anesthesia. She was also seen by Dr. Ether Griffins the podiatrist who thought that she may lose her right fourth toe at some stage may need an amputation of this. [name], [name] ([id number]) Electronic Signature(s) Signed: 10/11/2015 10:14:23 AM By: Evlyn Kanner MD, FACS Previous Signature: 10/11/2015 9:41:42 AM Version By: Evlyn Kanner MD, FACS Previous Signature: 10/11/2015 9:40:09 AM Version By: Evlyn Kanner MD, FACS Entered By: Evlyn Kanner on 10/11/2015 10:14:23 [name]  ([id number]) -------------------------------------------------------------------------------- Physical Exam Details Patient Name: [name], [name]. Date of Service: 10/11/2015 9:15 AM Medical Record Patient Account Number: [id number] Number: Afful, RN, BSN, Treating RN: 24-Feb-1934 (82 y.o. [place] Sink Date of Birth/Sex: Female) Other Clinician: Primary Care Physician: [name] Treating Evlyn Kanner Referring Physician: [name] Physician/Extender: Weeks in Treatment: 1 Constitutional . Pulse regular. Respirations normal and unlabored. Afebrile. . Eyes Nonicteric. Reactive to light. Ears, Nose, Mouth, and Throat Lips, teeth, and gums WNL.Marland Kitchen Moist mucosa without lesions. Neck supple and nontender. No palpable supraclavicular or cervical adenopathy.  Normal sized without goiter. Respiratory WNL. No retractions.. Breath sounds WNL, No rubs, rales, rhonchi, or wheeze.. Cardiovascular Pedal Pulses WNL. No clubbing, cyanosis or edema. Lymphatic No adneopathy. No adenopathy. No adenopathy. Musculoskeletal Adexa without tenderness or enlargement.. Digits and nails w/o clubbing, cyanosis, infection, petechiae, ischemia, or inflammatory conditions.. Integumentary (Hair, Skin) No suspicious lesions. No crepitus or fluctuance. No peri-wound warmth or erythema. No masses.Marland Kitchen Psychiatric Judgement and insight Intact.. No evidence of depression, anxiety, or agitation.. Notes the eschar on the left lower abdominal wall continues to be fairly significant and cannot be debrided today. The right lower extremity has several areas of necrotic debris down to the subcutis tissues and this was sharply debrided with a forcep and scissors. The right Achilles tendon also had some debris which was removed sharply with forcep and scissors. No active bleeding. The tips of her third and fourth toe continued to have dry gangrene. Electronic Signature(s) Signed: 10/11/2015 10:15:53 AM By: Evlyn Kanner MD, FACS Entered By: Evlyn Kanner on 10/11/2015 10:15:53 [name] ([id number]) -------------------------------------------------------------------------------- Physician Orders Details Patient Name: [name] Date of Service: 10/11/2015 9:15 AM Medical Record Patient Account Number: [id number] Number: Afful, RN, BSN, Treating RN: 1933/09/23 (82 y.o.  Sink Date of Birth/Sex: Female) Other Clinician: Primary Care Physician: [name] Treating Evlyn Kanner Referring Physician: [name] Physician/Extender: Tania Ade in Treatment: 1 Verbal / Phone Orders: Yes Clinician: Afful, RN, BSN, Rita Read Back and Verified: Yes Diagnosis Coding Wound Cleansing Wound #1 Right Toe Third o Cleanse wound with mild soap and water o May Shower, gently pat wound dry prior to applying new dressing. o May shower with protection. Wound #2 Right Toe Fourth o Cleanse wound with mild soap and water o May Shower, gently pat wound dry prior to applying new dressing. o May shower with protection. Wound #3 Right Calcaneus o Cleanse wound with mild soap and water o May Shower, gently pat wound dry prior to applying new dressing. o May shower with protection. Wound #4 Right Achilles o Cleanse wound with mild soap and water o May Shower, gently pat wound dry prior to applying new dressing. o May shower with protection. Wound #5 Right,Lateral Lower Leg o Cleanse wound with mild soap and water o May Shower, gently pat wound dry prior to applying new dressing. o May shower with protection. Wound #6 Left Abdomen - Lower Quadrant o Cleanse wound with mild soap and water o May Shower, gently pat wound dry prior to applying new dressing. o May shower with protection. Wound #7 Distal Abdomen - midline o Cleanse wound with mild soap and water o May Shower, gently pat wound dry prior to applying new dressing. o May shower with  protection. AHMYAH, GIDLEY ([id number]) Anesthetic Wound #1 Right Toe Third o Topical Lidocaine 4% cream applied to wound bed prior to debridement Wound #2 Right Toe Fourth o Topical Lidocaine 4% cream applied to wound bed prior to debridement Wound #3 Right Calcaneus o Topical Lidocaine 4% cream applied to wound bed prior to debridement Wound #4 Right Achilles o Topical Lidocaine 4% cream applied to wound bed prior to debridement Wound #5 Right,Lateral Lower Leg o Topical Lidocaine 4% cream applied to wound bed prior to debridement Wound #6 Left Abdomen - Lower Quadrant o Topical Lidocaine 4% cream applied to wound bed prior to debridement Wound #7 Distal Abdomen - midline o Topical Lidocaine 4% cream applied to wound bed prior to debridement Primary Wound Dressing Wound #1 Right Toe Third o Other: -  Betadine paint Wound #2 Right Toe Fourth o Other: - Betadine paint Wound #3 Right Calcaneus o Santyl Ointment Wound #4 Right Achilles o Santyl Ointment Wound #5 Right,Lateral Lower Leg o Santyl Ointment Wound #6 Left Abdomen - Lower Quadrant o Santyl Ointment Wound #7 Distal Abdomen - midline o Santyl Ointment Secondary Dressing Wound #1 Right Toe Third o Gauze and Kerlix/Conform Natalie RimaHOPKINS, Suzi Y. ([id number]) Wound #2 Right Toe Fourth o Gauze and Kerlix/Conform Wound #3 Right Calcaneus o Gauze and Kerlix/Conform Wound #4 Right Achilles o Gauze and Kerlix/Conform Wound #5 Right,Lateral Lower Leg o Gauze and Kerlix/Conform Wound #6 Left Abdomen - Lower Quadrant o Boardered Foam Dressing Wound #7 Distal Abdomen - midline o Boardered Foam Dressing Dressing Change Frequency Wound #1 Right Toe Third o Change dressing every day. Wound #2 Right Toe Fourth o Change dressing every day. Wound #3 Right Calcaneus o Change dressing every day. Wound #4 Right Achilles o Change dressing every day. Wound #5 Right,Lateral Lower  Leg o Change dressing every day. Wound #6 Left Abdomen - Lower Quadrant o Change dressing every day. Wound #7 Distal Abdomen - midline o Change dressing every day. Follow-up Appointments Wound #1 Right Toe Third o Return Appointment in 1 week. Wound #2 Right Toe Fourth o Return Appointment in 1 week. Wound #3 Right Calcaneus Natalie RimaHOPKINS, Lanijah Y. ([id number]) o Return Appointment in 2 weeks. Wound #4 Right Achilles o Return Appointment in 2 weeks. Wound #5 Right,Lateral Lower Leg o Return Appointment in 2 weeks. Wound #6 Left Abdomen - Lower Quadrant o Return Appointment in 2 weeks. Wound #7 Distal Abdomen - midline o Return Appointment in 2 weeks. Additional Orders / Instructions Wound #1 Right Toe Third o Increase protein intake. o Activity as tolerated Wound #2 Right Toe Fourth o Increase protein intake. o Activity as tolerated Wound #3 Right Calcaneus o Increase protein intake. o Activity as tolerated Wound #4 Right Achilles o Increase protein intake. o Activity as tolerated Wound #5 Right,Lateral Lower Leg o Increase protein intake. o Activity as tolerated Wound #6 Left Abdomen - Lower Quadrant o Increase protein intake. o Activity as tolerated Wound #7 Distal Abdomen - midline o Increase protein intake. o Activity as tolerated Notes HBO discussed with Patient and daughter. They will see cardiologist and PCP for further diagnostics and labs for cleanrance Electronic Signature(s) Natalie RimaHOPKINS, Nyia Y. ([id number]) Signed: 10/11/2015 3:35:42 PM By: Elpidio EricAfful, Rita BSN, RN Signed: 10/11/2015 3:59:17 PM By: Evlyn KannerBritto, Zackry Deines MD, FACS Entered By: Elpidio EricAfful, Rita on 10/11/2015 10:16:56 Natalie RimaHOPKINS, Luisana Y. ([id number]) -------------------------------------------------------------------------------- Problem List Details Patient Name: Natalie RimaHOPKINS, Kharisma Y. Date of Service: 10/11/2015 9:15 AM Medical Record Patient Account Number:  [id number] Number: Afful, RN, BSN, Treating RN: [birth year] (82 y.o. 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 ([id number]) -------------------------------------------------------------------------------- Progress  Note Details Patient Name: [name], [name]. Date of Service: 10/11/2015 9:15 AM Medical Record Patient Account Number: [id number] Number: Afful, RN, BSN, Treating RN: 1934-02-08 (82 y.o. [place] S[place] Date of Birth/Sex: Female) Other Clinician: Primary Care Physician: [name] Treating Evlyn Kanner Referring Physician: [name] Physician/Extender: Tania Ade in Treatment: 1 Subjective Chief Complaint Information obtained from Patient Patients presents for treatment of an open diabetic ulcer, arterial ulcer and pressure ulcers to the right lower extremities which is a complex etiology for the last several months. He also has ulcers on her abdomen in the left lower quadrant and left suprapubic area which she's had for about 4  months. History of Present Illness (HPI) The following HPI elements were documented for the patient's wound: Location: several wounds on the right lower extremity including her right healed right posterior ankle and right lower third of the leg. She also has wounds on her left lower quadrant of the abdomen and the suprapubic area. Quality: Patient reports experiencing a sharp pain to affected area(s). Severity: Patient states wound are getting worse. Duration: Patient has had the wound for > 3 months prior to seeking treatment at the wound center Timing: Pain in wound is Intermittent (comes and goes Context: The wound appeared gradually over time Modifying Factors: Other treatment(s) tried include:he simply been admitted to the hospital 2 weeks ago and has had procedures done on her right lower extremity and had a blockage which we are trying to get some notes Associated Signs and Symptoms: Patient reports having increase discharge. 82 year old patient who is known to be diabetic, was referred to [place] by Dr. Gavin Potters for a right heel ulceration which she's had for a while. She was recently in hospital for a pneumonia and at that time and got delirious and was disoriented and sometime during this time developed a stage II ulcer on her right heel. Her past medical history is significant for bilateral pneumonia which was treated with injectable antibiotics and then to oral Levaquin which he has completed. She also has acute on chronic diastolic CHF, acute on chronic respiratory failure, end-stage renal disease on hemodialysis, atrial fibrillation, recent stroke, diabetes mellitus. The patient and her son are poor historians but from what I understand she was admitted to the hospital with an acute vascular compromise of her right lower extremity and Dr. Wyn Quaker has done a surgical procedure and we are trying to obtain these notes. There are also some vascular workup done and we will try and obtain  these notes. the injury to the left lower quadrant of abdomen and the suprapubic area have been there due to a bruise and have been there for several months and no intervention has been done. 10/11/2015 -- on review of the electronics records it was noted that the patient was admitted to the hospital [name], [name] ([id number]) on 09/14/2015 with peripheral vascular disease with claudication, end-stage renal disease, pressure ulcer, chronic atrial fibrillation. She was seen by Dr. Wyn Quaker who did her right lower extremity angiogram , angioplasty of the right anterior tibial artery and thrombolysis with TPA of the right popliteal artery, and thrombectomy. She was seen by Dr. Wyn Quaker during this past week and he was pleased with the progress. He did say that if he took her to the operating room for any procedure he would debride the abdominal wound under anesthesia. She was also seen by Dr. Ether Griffins the podiatrist who thought that she may lose her right fourth toe at some stage may need an amputation  of this. Objective Constitutional Pulse regular. Respirations normal and unlabored. Afebrile. Vitals Time Taken: 9:30 AM, Height: 65 in, Weight: 159 lbs, BMI: 26.5, Temperature: 97.6 F, Pulse: 70 bpm, Respiratory Rate: 16 breaths/min, Blood Pressure: 115/48 mmHg. Eyes Nonicteric. Reactive to light. Ears, Nose, Mouth, and Throat Lips, teeth, and gums WNL.Marland Kitchen Moist mucosa without lesions. Neck supple and nontender. No palpable supraclavicular or cervical adenopathy. Normal sized without goiter. Respiratory WNL. No retractions.. Breath sounds WNL, No rubs, rales, rhonchi, or wheeze.. Cardiovascular Pedal Pulses WNL. No clubbing, cyanosis or edema. Lymphatic No adneopathy. No adenopathy. No adenopathy. Musculoskeletal Adexa without tenderness or enlargement.. Digits and nails w/o clubbing, cyanosis, infection, petechiae, ischemia, or inflammatory conditions.Marland Kitchen Psychiatric Judgement and insight Intact..  No evidence of depression, anxiety, or agitation.. General Notes: the eschar on the left lower abdominal wall continues to be fairly significant and cannot be [name], [name] ([id number]) debrided today. The right lower extremity has several areas of necrotic debris down to the subcutis tissues and this was sharply debrided with a forcep and scissors. The right Achilles tendon also had some debris which was removed sharply with forcep and scissors. No active bleeding. The tips of her third and fourth toe continued to have dry gangrene. Integumentary (Hair, Skin) No suspicious lesions. No crepitus or fluctuance. No peri-wound warmth or erythema. No masses.. Wound #1 status is Open. Original cause of wound was Not Known. The wound is located on the Right Toe Third. The wound measures 1cm length x 1cm width x 0.1cm depth; 0.785cm^2 area and 0.079cm^3 volume. The wound is limited to skin breakdown. There is no tunneling or undermining noted. There is a none present amount of drainage noted. The wound margin is flat and intact. There is no granulation within the wound bed. There is a large (67-100%) amount of necrotic tissue within the wound bed including Eschar. The periwound skin appearance exhibited: Localized Edema, Dry/Scaly, Erythema. The periwound skin appearance did not exhibit: Callus, Crepitus, Excoriation, Fluctuance, Friable, Induration, Rash, Scarring, Maceration, Moist, Atrophie Blanche, Cyanosis, Ecchymosis, Hemosiderin Staining, Mottled, Pallor, Rubor. The surrounding wound skin color is noted with erythema which is circumferential. The periwound has tenderness on palpation. Wound #2 status is Open. Original cause of wound was Not Known. The wound is located on the Right Toe Fourth. The wound measures 2cm length x 4cm width x 0.1cm depth; 6.283cm^2 area and 0.628cm^3 volume. The wound is limited to skin breakdown. There is no tunneling or undermining noted. There is a medium amount  of serous drainage noted. The wound margin is flat and intact. There is small (1-33%) pink granulation within the wound bed. There is a large (67-100%) amount of necrotic tissue within the wound bed including Eschar and Adherent Slough. The periwound skin appearance exhibited: Localized Edema, Dry/Scaly, Moist, Erythema. The periwound skin appearance did not exhibit: Callus, Crepitus, Excoriation, Fluctuance, Friable, Induration, Rash, Scarring, Maceration, Atrophie Blanche, Cyanosis, Ecchymosis, Hemosiderin Staining, Mottled, Pallor, Rubor. The surrounding wound skin color is noted with erythema which is circumferential. The periwound has tenderness on palpation. Wound #3 status is Open. Original cause of wound was Not Known. The wound is located on the Right Calcaneus. The wound measures 1.5cm length x 1.5cm width x 0.1cm depth; 1.767cm^2 area and 0.177cm^3 volume. The wound is limited to skin breakdown. There is no tunneling or undermining noted. There is a none present amount of drainage noted. The wound margin is flat and intact. There is no granulation within the wound bed. There is a large (67-100%)  amount of necrotic tissue within the wound bed including Eschar. The periwound skin appearance exhibited: Localized Edema, Dry/Scaly, Erythema. The periwound skin appearance did not exhibit: Callus, Crepitus, Excoriation, Fluctuance, Friable, Induration, Rash, Scarring, Maceration, Moist, Atrophie Blanche, Cyanosis, Ecchymosis, Hemosiderin Staining, Mottled, Pallor, Rubor. The surrounding wound skin color is noted with erythema which is circumferential. The periwound has tenderness on palpation. Wound #4 status is Open. Original cause of wound was Not Known. The wound is located on the Right Achilles. The wound measures 5.5cm length x 4.5cm width x 0.1cm depth; 19.439cm^2 area and 1.944cm^3 volume. The wound is limited to skin breakdown. There is no tunneling noted. There is a medium amount of  serous drainage noted. The wound margin is flat and intact. There is small (1-33%) pink granulation within the wound bed. There is a large (67-100%) amount of necrotic tissue within the wound bed including Eschar and Adherent Slough. The periwound skin appearance exhibited: Localized Edema, Dry/Scaly, Moist, Erythema. The periwound skin appearance did not exhibit: Callus, Crepitus, Excoriation, Fluctuance, Friable, Induration, Rash, Scarring, Maceration, Atrophie Blanche, Cyanosis, Ecchymosis, Hemosiderin Staining, Mottled, Pallor, Rubor. The surrounding wound skin color is noted with erythema which is circumferential. Periwound temperature was noted as Cool/Cold. The periwound has tenderness [name], [name]. ([id number]) on palpation. Wound #5 status is Open. Original cause of wound was Not Known. The wound is located on the Right,Lateral Lower Leg. The wound measures 11cm length x 3.5cm width x 0.2cm depth; 30.238cm^2 area and 6.048cm^3 volume. The wound is limited to skin breakdown. There is no tunneling or undermining noted. There is a medium amount of serous drainage noted. The wound margin is flat and intact. There is no granulation within the wound bed. There is a large (67-100%) amount of necrotic tissue within the wound bed including Eschar and Adherent Slough. The periwound skin appearance exhibited: Localized Edema, Dry/Scaly, Moist, Erythema. The periwound skin appearance did not exhibit: Callus, Crepitus, Excoriation, Fluctuance, Friable, Induration, Rash, Scarring, Maceration, Atrophie Blanche, Cyanosis, Ecchymosis, Hemosiderin Staining, Mottled, Pallor, Rubor. The surrounding wound skin color is noted with erythema which is circumferential. The periwound has tenderness on palpation. Wound #6 status is Open. Original cause of wound was Trauma. The wound is located on the Left Abdomen - Lower Quadrant. The wound measures 3cm length x 9cm width x 0.1cm depth; 21.206cm^2 area and  2.121cm^3 volume. The wound is limited to skin breakdown. There is no tunneling or undermining noted. There is a none present amount of drainage noted. The wound margin is flat and intact. There is no granulation within the wound bed. There is a large (67-100%) amount of necrotic tissue within the wound bed including Eschar. The periwound skin appearance exhibited: Localized Edema, Dry/Scaly, Erythema. The periwound skin appearance did not exhibit: Callus, Crepitus, Excoriation, Fluctuance, Friable, Induration, Rash, Scarring, Maceration, Moist, Atrophie Blanche, Cyanosis, Ecchymosis, Hemosiderin Staining, Mottled, Pallor, Rubor. The surrounding wound skin color is noted with erythema which is circumferential. The periwound has tenderness on palpation. Wound #7 status is Open. Original cause of wound was Trauma. The wound is located on the Distal Abdomen - midline. The wound measures 4.5cm length x 2.5cm width x 0.2cm depth; 8.836cm^2 area and 1.767cm^3 volume. The wound is limited to skin breakdown. There is no tunneling or undermining noted. There is a medium amount of serous drainage noted. The wound margin is flat and intact. There is no granulation within the wound bed. There is a large (67-100%) amount of necrotic tissue within the wound bed including Eschar  and Adherent Slough. The periwound skin appearance exhibited: Moist, Erythema. The periwound skin appearance did not exhibit: Callus, Crepitus, Excoriation, Fluctuance, Friable, Induration, Localized Edema, Rash, Scarring, Dry/Scaly, Maceration, Atrophie Blanche, Cyanosis, Ecchymosis, Hemosiderin Staining, Mottled, Pallor, Rubor. The surrounding wound skin color is noted with erythema which is circumferential. The periwound has tenderness on palpation. 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 She also has a fairly deep subcutaneous injury to her anterior abdomen wall in the left lower quadrant and suprapubic area which has been there for 4 months. Several reports are still pending and we will await these reports before making further recommendation. I have recommended: 1. Santyl ointment to her abdominal wall wounds 2. Betadine paint to the toes on her right foot which have dry gangrene and to the right heel which is of indeterminate depth pressure ulcer 3. Santyl ointment to the right lower extremity and to the right Achilles tendon 4. reviewed the notes of a vascular procedure and the investigations done at Dr. Driscilla Grammes office. 5. more detailed notes from her endocrinologist(get a recent hemoglobin A1c) and cardiologist. I have asked the family to review with cardiology to see if she will be fit for hyperbaric oxygen therapy. 6. after adequate preparation and giving her adequate consultative therapy and aggressive wound care she may benefit from hyperbaric oxygen therapy and this has been discussed with the patient and her daughter and all questions regarding this uncertain. We will also give her some literature to read. Procedures Wound #4 Wound #4 is an Arterial Insufficiency Ulcer located on the Right Achilles . There was a Skin/Subcutaneous Tissue Debridement ([id number]) debridement with total area of 9 sq cm performed by Evlyn Kanner, MD. with the following  instrument(s): Forceps and Scissors to remove Viable and Non-Viable tissue/material including Fibrin/Slough, Skin, and Subcutaneous after achieving pain control using Lidocaine 4% Topical Solution. A time out was conducted prior to the start of the procedure. A Minimum amount of bleeding was controlled with Pressure. The procedure was tolerated well with a pain level of 0 throughout and a pain level of 0 following the procedure. Post Debridement Measurements: 5.5cm length x 4.5cm width x 0.1cm depth; 1.944cm^3 volume. Post procedure Diagnosis Wound #4: Same as Pre-Procedure Wound #5 Wound #5 is an Arterial Insufficiency Ulcer located on the Right,Lateral Lower Leg . There was a Skin/Subcutaneous Tissue Debridement ([id number]) debridement with total area of 8 sq cm performed by Evlyn Kanner, MD. with the following instrument(s): Forceps and Scissors to remove Viable and Non- Viable tissue/material including Fibrin/Slough and Subcutaneous after achieving pain control using Lidocaine 4% Topical Solution. A time out was conducted prior to the start of the procedure. A Minimum amount of bleeding was controlled with Pressure. The procedure was tolerated well with a pain level of 0 [name], [name]. ([id number]) throughout and a pain level of 0 following the procedure. Post Debridement Measurements: 11cm length x 3.5cm width x 0.2cm depth; 6.048cm^3 volume.  Post procedure Diagnosis Wound #5: Same as Pre-Procedure Plan Wound Cleansing: Wound #1 Right Toe Third: Cleanse wound with mild soap and water May Shower, gently pat wound dry prior to applying new dressing. May shower with protection. Wound #2 Right Toe Fourth: Cleanse wound with mild soap and water May Shower, gently pat wound dry prior to applying new dressing. May shower with protection. Wound #3 Right Calcaneus: Cleanse wound with mild soap and water May Shower, gently pat wound dry prior to applying new dressing. May shower with  protection. Wound #4 Right Achilles: Cleanse wound with mild soap and water May Shower, gently pat wound dry prior to applying new dressing. May shower with protection. Wound #5 Right,Lateral Lower Leg: Cleanse wound with mild soap and water May Shower, gently pat wound dry prior to applying new dressing. May shower with protection. Wound #6 Left Abdomen - Lower Quadrant: Cleanse wound with mild soap and water May Shower, gently pat wound dry prior to applying new dressing. May shower with protection. Wound #7 Distal Abdomen - midline: Cleanse wound with mild soap and water May Shower, gently pat wound dry prior to applying new dressing. May shower with protection. Anesthetic: Wound #1 Right Toe Third: Topical Lidocaine 4% cream applied to wound bed prior to debridement Wound #2 Right Toe Fourth: Topical Lidocaine 4% cream applied to wound bed prior to debridement Wound #3 Right Calcaneus: Topical Lidocaine 4% cream applied to wound bed prior to debridement Wound #4 Right Achilles: Topical Lidocaine 4% cream applied to wound bed prior to debridement Wound #5 Right,Lateral Lower Leg: [name], [name] ([id number]) Topical Lidocaine 4% cream applied to wound bed prior to debridement Wound #6 Left Abdomen - Lower Quadrant: Topical Lidocaine 4% cream applied to wound bed prior to debridement Wound #7 Distal Abdomen - midline: Topical Lidocaine 4% cream applied to wound bed prior to debridement Primary Wound Dressing: Wound #3 Right Calcaneus: Santyl Ointment Wound #4 Right Achilles: Santyl Ointment Wound #5 Right,Lateral Lower Leg: Santyl Ointment Wound #6 Left Abdomen - Lower Quadrant: Santyl Ointment Wound #7 Distal Abdomen - midline: Santyl Ointment Wound #1 Right Toe Third: Other: - Betadine paint Wound #2 Right Toe Fourth: Other: - Betadine paint Secondary Dressing: Wound #6 Left Abdomen - Lower Quadrant: Boardered Foam Dressing Wound #7 Distal Abdomen -  midline: Boardered Foam Dressing Wound #1 Right Toe Third: Gauze and Kerlix/Conform Wound #2 Right Toe Fourth: Gauze and Kerlix/Conform Wound #3 Right Calcaneus: Gauze and Kerlix/Conform Wound #4 Right Achilles: Gauze and Kerlix/Conform Wound #5 Right,Lateral Lower Leg: Gauze and Kerlix/Conform Dressing Change Frequency: Wound #1 Right Toe Third: Change dressing every day. Wound #2 Right Toe Fourth: Change dressing every day. Wound #3 Right Calcaneus: Change dressing every day. Wound #4 Right Achilles: Change dressing every day. Wound #5 Right,Lateral Lower Leg: Change dressing every day. Wound #6 Left Abdomen - Lower Quadrant: Change dressing every day. Wound #7 Distal Abdomen - midline: Change dressing every day. Follow-up Appointments: [name], [name] ([id number]) Wound #1 Right Toe Third: Return Appointment in 1 week. Wound #2 Right Toe Fourth: Return Appointment in 1 week. Wound #3 Right Calcaneus: Return Appointment in 1 week. Wound #4 Right Achilles: Return Appointment in 1 week. Wound #5 Right,Lateral Lower Leg: Return Appointment in 1 week. Wound #6 Left Abdomen - Lower Quadrant: Return Appointment in 1 week. Wound #7 Distal Abdomen - midline: Return Appointment in 1 week. Additional Orders / Instructions: Wound #1 Right Toe Third: Increase protein intake. Activity as tolerated Wound #2 Right Toe  Fourth: Increase protein intake. Activity as tolerated Wound #3 Right Calcaneus: Increase protein intake. Activity as tolerated Wound #4 Right Achilles: Increase protein intake. Activity as tolerated Wound #5 Right,Lateral Lower Leg: Increase protein intake. Activity as tolerated Wound #6 Left Abdomen - Lower Quadrant: Increase protein intake. Activity as tolerated Wound #7 Distal Abdomen - midline: Increase protein intake. 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 She also has a fairly deep subcutaneous injury to her anterior abdomen wall in the left lower quadrant and suprapubic area which has been there for 4 months. Several reports are still pending and we will await these reports before making further recommendation. I have recommended: ALYSHA, DOOLAN ([id number]) 1. Santyl ointment to her abdominal wall wounds 2. Betadine paint to the toes on her right foot which have dry gangrene and to the right heel which is of indeterminate depth pressure ulcer 3. Santyl ointment to the right lower extremity and to the right Achilles tendon 4. reviewed the notes of a vascular procedure and the investigations done at Dr. Driscilla Grammes office. 5. more detailed notes from her endocrinologist(get a recent hemoglobin A1c) and cardiologist. I have asked the family to review with cardiology to see if she will be fit for hyperbaric oxygen therapy. 6. after adequate preparation and giving her adequate consultative therapy and aggressive wound care she may benefit from hyperbaric oxygen therapy and this has been discussed with the patient and her daughter and all questions regarding this uncertain. We will also give her some literature to read. Electronic Signature(s) Signed: 10/11/2015 10:18:20 AM By: Evlyn Kanner MD, FACS Entered By: Evlyn Kanner on 10/11/2015 10:18:20 [name] ([id number]) -------------------------------------------------------------------------------- SuperBill Details Patient Name: [name] Date of Service: 10/11/2015 Medical Record Patient Account Number: [id number] Number: Afful, RN, BSN, Treating RN: [birth year] (82 y.o. 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 E11.52 Type 2 diabetes mellitus with diabetic peripheral angiopathy with gangrene S30.1XXA Contusion of abdominal wall, initial encounter Unspecified open wound of abdominal wall, left lower quadrant without penetration into S31.104A peritoneal cavity, initial encounter L89.610 Pressure ulcer of right heel, unstageable Facility Procedures CPT4 Code Description: [id number] 11042 - DEB SUBQ TISSUE 20 SQ CM/< ICD-10 Description Diagnosis E11.621 Type 2 diabetes mellitus with foot ulcer I70.233 Atherosclerosis of native arteries of right leg with u E11.52 Type 2 diabetes mellitus with diabetic  peripheral angi Modifier: lceration of ank opathy with gang Quantity: 1 le rene Physician Procedures CPT4: Description Modifier Quantity Code [id number] - WC PHYS LEVEL 3 - EST PT 25 1 ICD-10 Description Diagnosis E11.621 Type 2 diabetes mellitus with foot ulcer I70.233 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

## 2015-10-11 NOTE — Progress Notes (Signed)
NYOMIE, EHRLICH ([id number]) Visit Report for 10/11/2015 Arrival Information Details Patient Name: [name], [name]. Date of Service: 10/11/2015 9:15 AM Medical Record Number: [id number] Patient Account Number: [id number] Date of Birth/Sex: 1934-06-01 (82 [name] Female) Treating RN: Afful, RN, BSN, [place] Sink Primary Care Physician: [name] Other Clinician: Referring Physician: [name] Treating Physician/Extender: Rudene Re in Treatment: 1 Visit Information History Since Last Visit Added or deleted any medications: No Patient Arrived: Wheel Chair Any new allergies or adverse reactions: No Arrival Time: 09:28 Had a fall or experienced change in No Accompanied By: dtr activities of daily living that may affect Transfer Assistance: None risk of falls: Patient Identification Verified: Yes Signs or symptoms of abuse/neglect since last No Secondary Verification Process Yes visito Completed: Hospitalized since last visit: No Patient Has Alerts: Yes Has Dressing in Place as Prescribed: Yes Patient Alerts: Patient on Blood Pain Present Now: No Thinner DMII Warfarin Electronic Signature(s) Signed: 10/11/2015 3:35:42 PM By: Elpidio Eric BSN, RN Entered By: Elpidio Eric on 10/11/2015 09:28:38 [name] ([id number]) -------------------------------------------------------------------------------- Encounter Discharge Information Details Patient Name: [name]. Date of Service: 10/11/2015 9:15 AM Medical Record Number: [id number] Patient Account Number: [id number] Date of Birth/Sex: 18-Mar-1934 (82 [name] Female) Treating RN: Clover Mealy, RN, BSN, [place] Sink Primary Care Physician: [name] Other Clinician: Referring Physician: [name] Treating Physician/Extender: Rudene Re in Treatment: 1 Encounter Discharge Information Items Discharge Pain Level: 0 Discharge Condition: Stable Ambulatory Status: Walker Discharge Destination: Home Transportation:  Private Auto Accompanied By: dtr Schedule Follow-up Appointment: No Medication Reconciliation completed No and provided to Patient/Care Kiron Osmun: Provided on Clinical Summary of Care: 10/11/2015 Form Type Recipient Paper Patient [facility] Electronic Signature(s) Signed: 10/11/2015 10:16:45 AM By: Gwenlyn Perking Entered By: Gwenlyn Perking on 10/11/2015 10:16:45 [name] ([id number]) -------------------------------------------------------------------------------- Lower Extremity Assessment Details Patient Name: [name] Date of Service: 10/11/2015 9:15 AM Medical Record Number: [id number] Patient Account Number: [id number] Date of Birth/Sex: 02-02-1934 (82 [name] Female) Treating RN: Afful, RN, BSN, [place] Sink Primary Care Physician: [name] Other Clinician: Referring Physician: [name] Treating Physician/Extender: Rudene Re in Treatment: 1 Edema Assessment Assessed: [Left: No] [Right: No] E[Left: dema] [Right: :] Calf Left: Right: Point of Measurement: 33 cm From Medial Instep cm cm Ankle Left: Right: Point of Measurement: 10 cm From Medial Instep cm cm Vascular Assessment Claudication: Claudication Assessment [Right:None] Pulses: Posterior Tibial Dorsalis Pedis Palpable: [Right:No] Doppler: [Right:Monophasic] Extremity colors, hair growth, and conditions: Extremity Color: [Right:Mottled] Hair Growth on Extremity: [Right:No] Temperature of Extremity: [Right:Cool] Toe Nail Assessment Left: Right: Thick: Yes Discolored: Yes Deformed: No Improper Length and Hygiene: No Electronic Signature(s) Signed: 10/11/2015 9:48:02 AM By: Elpidio Eric BSN, RN Entered By: Elpidio Eric on 10/11/2015 09:48:02 [name], [name] ([id number]) [name] ([id number]) -------------------------------------------------------------------------------- Multi Wound Chart Details Patient Name: [name] Date of Service: 10/11/2015 9:15 AM Medical Record  Number: [id number] Patient Account Number: [id number] Date of Birth/Sex: 06-07-1933 (82 [name] 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] Comorbid History: [1:Arrhythmia, Congestive Arrhythmia, Congestive Arrhythmia, Congestive Heart Failure, Type  II Diabetes] [2:Heart Failure, Type II Diabetes] [3:Heart Failure, Type II Diabetes] Date Acquired: [1:09/02/2015] [2:09/02/2015] [3:09/02/2015] Weeks of Treatment: [1:1] [2:1] [3:1] Wound Status: [1:Open] [2:Open] [3:Open] Pending Amputation on Yes [2:Yes] [3:Yes] Presentation: Measurements L x W x D 1x1x0.1 [2:2x4x0.1] [3:1.5x1.5x0.1] (cm) Area (cm) : [1:0.785] [2:6.283] [3:1.767] Volume (cm) : [1:0.079] [2:0.628] [3:0.177] % Reduction in Area: [1:9.10%] [2:-29.00%] [3:6.30%] % Reduction in Volume: 8.10% [2:-29.00%] [3:5.90%] Classification: [1:Unclassifiable] [2:Partial Thickness] [3:Unclassifiable] HBO Classification: [1:Grade 1] [2:Grade 1] [3:Grade 1] Exudate Amount: [1:None Present] [2:Medium] [3:None Present] Exudate Type: [1:N/A] [2:Serous] [3:N/A] Exudate Color: [1:N/A] [2:amber] [3:N/A] Wound Margin: [1:Flat and Intact] [2:Flat and Intact] [3:Flat and Intact] Granulation Amount: [1:None Present (0%)] [2:Small (1-33%)] [3:None Present (0%)] Granulation Quality: [1:N/A] [2:Pink] [3:N/A] Necrotic Amount: [1:Large  (67-100%)] [2:Large (67-100%)] [3:Large (67-100%)] Necrotic Tissue: [1:Eschar] [2:Eschar, Adherent Slough Eschar] Exposed Structures: [name], [name] ([id number]) Fascia: No Fascia: No Fascia: No Fat: No Fat: No Fat: No Tendon: No Tendon: No Tendon: No Muscle: No Muscle: No Muscle: No Joint: No Joint: No Joint: No Bone: No Bone: No Bone: No Limited to Skin Limited to Skin Limited to Skin Breakdown Breakdown Breakdown Epithelialization: None None None Periwound Skin Texture: Edema: Yes Edema: Yes Edema: Yes Excoriation: No Excoriation: No Excoriation: No Induration: No Induration: No Induration: No Callus: No Callus: No Callus: No Crepitus: No Crepitus: No Crepitus: No Fluctuance: No Fluctuance: No Fluctuance: No Friable: No Friable: No Friable: No Rash: No Rash: No Rash: No Scarring: No Scarring: No Scarring: No Periwound Skin Dry/Scaly: Yes Moist: Yes Dry/Scaly: Yes Moisture: Maceration: No Dry/Scaly: Yes Maceration: No Moist: No Maceration: No Moist: No Periwound Skin Color: Erythema: Yes Erythema: Yes Erythema: Yes Atrophie Blanche: No Atrophie Blanche: No Atrophie Blanche: No Cyanosis: No Cyanosis: No Cyanosis: No Ecchymosis: No Ecchymosis: No Ecchymosis: No Hemosiderin Staining: No Hemosiderin Staining: No Hemosiderin Staining: No Mottled: No Mottled: No Mottled: No Pallor: No Pallor: No Pallor: No Rubor: No Rubor: No Rubor: No Erythema Location: Circumferential Circumferential Circumferential Temperature: N/A N/A N/A Tenderness on Yes Yes Yes Palpation: Wound Preparation: Ulcer Cleansing: Ulcer Cleansing: Ulcer Cleansing: Rinsed/Irrigated with Rinsed/Irrigated with Rinsed/Irrigated with Saline Saline Saline Topical Anesthetic Topical Anesthetic Topical Anesthetic Applied: Other: Lidocaine Applied: Other: Lidocaine Applied: Other: lidocaine 4% 4% 4% Wound Number: [id number]  Photos: No Photos No Photos No Photos Wound  Location: Right Achilles Right Lower Leg - Lateral Left Abdomen - Lower Quadrant Wounding Event: Not Known Not Known Trauma 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 Trauma, Other Comorbid History: Arrhythmia, Congestive Arrhythmia, Congestive Arrhythmia, Congestive Heart Failure, Type II Heart Failure, Type II Heart Failure, Type II Diabetes Diabetes Diabetes [name], [name] ([id number]) Date Acquired: 09/02/2015 09/02/2015 06/10/2015 Weeks of Treatment: 1 1 1  Wound Status: Open Open Open Pending Amputation on Yes Yes No Presentation: Measurements L x W x D 5.5x4.5x0.1 11x3.5x0.2 3x9x0.1 (cm) Area (cm) : 19.439 30.238 21.206 Volume (cm) : 1.944 6.048 2.121 % Reduction in Area: -34.50% 65.30% 15.10% % Reduction in Volume: 55.20% 65.30% 15.10% Classification: Full Thickness Without Full Thickness Without Unclassifiable Exposed Support Exposed Support Structures Structures HBO Classification: Grade 1 Grade 1 N/A Exudate Amount: Medium Medium None Present Exudate Type: Serous Serous N/A Exudate Color: amber amber N/A Wound Margin: Flat and Intact Flat and Intact Flat and Intact Granulation Amount: Small (1-33%) None Present (0%) None Present (0%) Granulation Quality: Pink N/A N/A Necrotic Amount: Large (67-100%) Large (67-100%) Large (67-100%) Necrotic Tissue: Eschar, Adherent Slough Eschar, Adherent Slough Eschar Exposed Structures: Fascia: No  Fascia: No Fascia: No Fat: No Fat: No Fat: No Tendon: No Tendon: No Tendon: No Muscle: No Muscle: No Muscle: No Joint: No Joint: No Joint: No Bone: No Bone: No Bone: No Limited to Skin Limited to Skin Limited to Skin Breakdown Breakdown Breakdown Epithelialization: None Small (1-33%) None Periwound Skin Texture: Edema: Yes Edema: Yes Edema: Yes Excoriation: No Excoriation: No Excoriation: No Induration: No Induration: No Induration: No Callus: No Callus: No Callus: No Crepitus:  No Crepitus: No Crepitus: No Fluctuance: No Fluctuance: No Fluctuance: No Friable: No Friable: No Friable: No Rash: No Rash: No Rash: No Scarring: No Scarring: No Scarring: No Periwound Skin Moist: Yes Moist: Yes Dry/Scaly: Yes Moisture: Dry/Scaly: Yes Dry/Scaly: Yes Maceration: No Maceration: No Maceration: No Moist: No Periwound Skin Color: Erythema: Yes Erythema: Yes Erythema: Yes Atrophie Blanche: No Atrophie Blanche: No Atrophie Blanche: No Cyanosis: No Cyanosis: No Cyanosis: No Ecchymosis: No Ecchymosis: No Ecchymosis: No Hemosiderin Staining: No Hemosiderin Staining: No Hemosiderin Staining: No Mottled: No Mottled: No Mottled: No [name], Natalie Y. ([id number]) Pallor: No Pallor: No Pallor: No Rubor: No Rubor: No Rubor: No Erythema Location: Circumferential Circumferential Circumferential Temperature: Cool/Cold N/A N/A Tenderness on Yes Yes Yes Palpation: Wound Preparation: Ulcer Cleansing: Ulcer Cleansing: Ulcer Cleansing: Rinsed/Irrigated with Rinsed/Irrigated with Rinsed/Irrigated with Saline Saline Saline Topical Anesthetic Topical Anesthetic Topical Anesthetic Applied: Other: lidocaine Applied: Other: lidocaine Applied: Other: lidocaine 4% 4% 4% Wound Number: 7 N/A N/A Photos: No Photos N/A N/A Wound Location: Abdomen - midline - Distal N/A N/A Wounding Event: Trauma N/A N/A Primary Etiology: Trauma, Other N/A N/A Comorbid History: Arrhythmia, Congestive N/A N/A Heart Failure, Type II Diabetes Date Acquired: 08/12/2015 N/A N/A Weeks of Treatment: 1 N/A N/A Wound Status: Open N/A N/A Pending Amputation on No N/A N/A Presentation: Measurements L x W x D 4.5x2.5x0.2 N/A N/A (cm) Area (cm) : 8.836 N/A N/A Volume (cm) : 1.767 N/A N/A % Reduction in Area: -234.80% N/A N/A % Reduction in Volume: -234.70% N/A N/A Classification: Unclassifiable N/A N/A HBO Classification: N/A N/A N/A Exudate Amount: Medium N/A N/A Exudate Type:  Serous N/A N/A Exudate Color: amber N/A N/A Wound Margin: Flat and Intact N/A N/A Granulation Amount: None Present (0%) N/A N/A Granulation Quality: N/A N/A N/A Necrotic Amount: Large (67-100%) N/A N/A Necrotic Tissue: Eschar, Adherent Slough N/A N/A Exposed Structures: Fascia: No N/A N/A Fat: No Tendon: No Muscle: No Joint: No Bone: No [name], Natalie Y. ([id number]) Limited to Skin Breakdown Epithelialization: None N/A N/A Periwound Skin Texture: Edema: No N/A N/A Excoriation: No Induration: No Callus: No Crepitus: No Fluctuance: No Friable: No Rash: No Scarring: No Periwound Skin Moist: Yes N/A N/A Moisture: Maceration: No Dry/Scaly: No Periwound Skin Color: Erythema: Yes N/A N/A Atrophie Blanche: No Cyanosis: No Ecchymosis: No Hemosiderin Staining: No Mottled: No Pallor: No Rubor: No Erythema Location: Circumferential N/A N/A Temperature: N/A N/A N/A Tenderness on Yes N/A N/A Palpation: Wound Preparation: Ulcer Cleansing: N/A N/A Rinsed/Irrigated with Saline Topical Anesthetic Applied: Other: lidocaine 4% Treatment Notes Electronic Signature(s) Signed: 10/11/2015 3:35:42 PM By: Elpidio EricAfful, Rita BSN, RN Entered By: Elpidio EricAfful, Rita on 10/11/2015 09:56:03 [name], Natalie Y. ([id number]) -------------------------------------------------------------------------------- Multi-Disciplinary Care Plan Details Patient Name: [name], Natalie Y. Date of Service: 10/11/2015 9:15 AM Medical Record Number: [id number] Patient Account Number: [id number] Date of Birth/Sex: 02/13/1934 (82 [name] Female) Treating RN: Clover MealyAfful, RN, BSN, [place] Sinkita Primary Care Physician: Rolm GalaGrandis, Heidi Other Clinician: Referring Physician: Rolm GalaGrandis, Heidi Treating Physician/Extender: Rudene ReBritto, Errol Weeks in Treatment: 1 Active Inactive Orientation to the Wound Care  Program Nursing Diagnoses: Knowledge deficit related to the wound healing center program Goals: Patient/caregiver will verbalize understanding of  the Wound Healing Center Program Date Initiated: 09/30/2015 Goal Status: Active Interventions: Provide education on orientation to the wound center Notes: Wound/Skin Impairment Nursing Diagnoses: Knowledge deficit related to ulceration/compromised skin integrity Goals: Ulcer/skin breakdown will have a volume reduction of 30% by week 4 Date Initiated: 09/30/2015 Goal Status: Active Ulcer/skin breakdown will have a volume reduction of 50% by week 8 Date Initiated: 09/30/2015 Goal Status: Active Ulcer/skin breakdown will have a volume reduction of 80% by week 12 Date Initiated: 09/30/2015 Goal Status: Active Ulcer/skin breakdown will heal within 14 weeks Date Initiated: 09/30/2015 Goal Status: Active Interventions: Assess patient/caregiver ability to obtain necessary supplies Assess ulceration(s) every visit NATALIEE, SHURTZ ([id number]) Provide education on ulcer and skin care Notes: Electronic Signature(s) Signed: 10/11/2015 3:35:42 PM By: Elpidio Eric BSN, RN Entered By: Elpidio Eric on 10/11/2015 09:55:49 [name] ([id number]) -------------------------------------------------------------------------------- Pain Assessment Details Patient Name: [name]. Date of Service: 10/11/2015 9:15 AM Medical Record Number: [id number] Patient Account Number: [id number] Date of Birth/Sex: 1934/01/28 (82 [name] Female) Treating RN: Clover Mealy, RN, BSN, [place] Sink Primary Care Physician: [name] Other Clinician: Referring Physician: [name] Treating Physician/Extender: Rudene Re in Treatment: 1 Active Problems Location of Pain Severity and Description of Pain Patient Has Paino No Site Locations Pain Management and Medication Current Pain Management: Electronic Signature(s) Signed: 10/11/2015 3:35:42 PM By: Elpidio Eric BSN, RN Entered By: Elpidio Eric on 10/11/2015 09:28:45 [name]  ([id number]) -------------------------------------------------------------------------------- Patient/Caregiver Education Details Patient Name: [name]. Date of Service: 10/11/2015 9:15 AM Medical Record Number: [id number] Patient Account Number: [id number] Date of Birth/Gender: Sep 11, 1933 (82 [name] Female) Treating RN: Clover Mealy, RN, BSN, [place] Sink Primary Care Physician: [name] Other Clinician: Referring Physician: [name] Treating Physician/Extender: Rudene Re in Treatment: 1 Education Assessment Education Provided To: Patient Education Topics Provided Welcome To The Wound Care Center: Methods: Explain/Verbal Responses: State content correctly Wound/Skin Impairment: Methods: Explain/Verbal Responses: State content correctly Electronic Signature(s) Signed: 10/11/2015 3:35:42 PM By: Elpidio Eric BSN, RN Entered By: Elpidio Eric on 10/11/2015 10:04:54 [name] ([id number]) -------------------------------------------------------------------------------- Wound Assessment Details Patient Name: [name]. Date of Service: 10/11/2015 9:15 AM Medical Record Number: [id number] Patient Account Number: [id number] Date of Birth/Sex: July 19, 1933 (82 [name] Female) Treating RN: Afful, RN, BSN, [place] Sink Primary Care Physician: [name] Other Clinician: Referring Physician: [name] Treating Physician/Extender: Rudene Re in Treatment: 1 Wound Status Wound Number: 1 Primary Arterial Insufficiency Ulcer Etiology: Wound Location: Right Toe Third Wound Open Wounding Event: Not Known Status: Date Acquired: 09/02/2015 Comorbid Arrhythmia, Congestive Heart Weeks Of Treatment: 1 History: Failure, Type II Diabetes Clustered Wound: No Pending Amputation On Presentation Photos Photo Uploaded By: Elpidio Eric on 10/11/2015 15:18:58 Wound Measurements Length: (cm) 1 Width: (cm) 1 Depth: (cm) 0.1 Area: (cm) 0.785 Volume: (cm) 0.079 %  Reduction in Area: 9.1% % Reduction in Volume: 8.1% Epithelialization: None Tunneling: No Undermining: No Wound Description Classification: Unclassifiable Diabetic Severity [address]): Grade 1 Wound Margin: Flat and Intact Exudate Amount: None Present Foul Odor After Cleansing: No Wound Bed Granulation Amount: None Present (0%) Exposed Structure Necrotic Amount: Large (67-100%) Fascia Exposed: No Necrotic Quality: Eschar Fat Layer Exposed: No Tendon Exposed: No MAKENZI, BANNISTER ([id number]) Muscle Exposed: No Joint Exposed: No Bone Exposed: No Limited to Skin Breakdown Periwound Skin Texture Texture Color No Abnormalities Noted: No No Abnormalities Noted: No Callus: No Atrophie Blanche: No Crepitus:  No Cyanosis: No Excoriation: No Ecchymosis: No Fluctuance: No Erythema: Yes Friable: No Erythema Location: Circumferential Induration: No Hemosiderin Staining: No Localized Edema: Yes Mottled: No Rash: No Pallor: No Scarring: No Rubor: No Moisture Temperature / Pain No Abnormalities Noted: No Tenderness on Palpation: Yes Dry / Scaly: Yes Maceration: No Moist: No Wound Preparation Ulcer Cleansing: Rinsed/Irrigated with Saline Topical Anesthetic Applied: Other: Lidocaine 4%, Treatment Notes Wound #1 (Right Toe Third) 1. Cleansed with: Clean wound with Normal Saline 4. Dressing Applied: Santyl Ointment 5. Secondary Dressing Applied Bordered Foam Dressing Gauze and Kerlix/Conform 7. Secured with Tape Notes Betadine Paint to toes Electronic Signature(s) Signed: 10/11/2015 9:48:39 AM By: Elpidio Eric BSN, RN Entered By: Elpidio Eric on 10/11/2015 09:48:38 [name] ([id number]) -------------------------------------------------------------------------------- Wound Assessment Details Patient Name: [name] Date of Service: 10/11/2015 9:15 AM Medical Record Number: [id number] Patient Account Number: [id number] Date of Birth/Sex: 03/10/1934 (82  [name] Female) Treating RN: Afful, RN, BSN, Rita Primary Care Physician: [name] Other Clinician: Referring Physician: [name] Treating Physician/Extender: Rudene Re in Treatment: 1 Wound Status Wound Number: 2 Primary Arterial Insufficiency Ulcer Etiology: Wound Location: Right Toe Fourth Wound Open Wounding Event: Not Known Status: Date Acquired: 09/02/2015 Comorbid Arrhythmia, Congestive Heart Weeks Of Treatment: 1 History: Failure, Type II Diabetes Clustered Wound: No Pending Amputation On Presentation Photos Photo Uploaded By: Elpidio Eric on 10/11/2015 15:20:14 Wound Measurements Length: (cm) 2 Width: (cm) 4 Depth: (cm) 0.1 Area: (cm) 6.283 Volume: (cm) 0.628 % Reduction in Area: -29% % Reduction in Volume: -29% Epithelialization: None Tunneling: No Undermining: No Wound Description Classification: Partial Thickness Diabetic Severity [address]): Grade 1 Wound Margin: Flat and Intact Exudate Amount: Medium Exudate Type: Serous Exudate Color: amber Foul Odor After Cleansing: No Wound Bed Granulation Amount: Small (1-33%) Exposed Structure Granulation Quality: Pink Fascia Exposed: No SELISA, TENSLEY ([id number]) Necrotic Amount: Large (67-100%) Fat Layer Exposed: No Necrotic Quality: Eschar, Adherent Slough Tendon Exposed: No Muscle Exposed: No Joint Exposed: No Bone Exposed: No Limited to Skin Breakdown Periwound Skin Texture Texture Color No Abnormalities Noted: No No Abnormalities Noted: No Callus: No Atrophie Blanche: No Crepitus: No Cyanosis: No Excoriation: No Ecchymosis: No Fluctuance: No Erythema: Yes Friable: No Erythema Location: Circumferential Induration: No Hemosiderin Staining: No Localized Edema: Yes Mottled: No Rash: No Pallor: No Scarring: No Rubor: No Moisture Temperature / Pain No Abnormalities Noted: No Tenderness on Palpation: Yes Dry / Scaly: Yes Maceration: No Moist: Yes Wound  Preparation Ulcer Cleansing: Rinsed/Irrigated with Saline Topical Anesthetic Applied: Other: Lidocaine 4%, Treatment Notes Wound #2 (Right Toe Fourth) 1. Cleansed with: Clean wound with Normal Saline 4. Dressing Applied: Santyl Ointment 5. Secondary Dressing Applied Bordered Foam Dressing Gauze and Kerlix/Conform 7. Secured with Tape Notes Betadine Paint to toes Electronic Signature(s) Signed: 10/11/2015 9:49:15 AM By: Elpidio Eric BSN, RN Entered By: Elpidio Eric on 10/11/2015 09:49:15 EMMRY, HINSCH ([id number]) ALKA, FALWELL ([id number]) -------------------------------------------------------------------------------- Wound Assessment Details Patient Name: [name] Date of Service: 10/11/2015 9:15 AM Medical Record Number: [id number] Patient Account Number: [id number] Date of Birth/Sex: February 15, 1934 (82 [name] 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 Wound Open Wounding Event: Not Known Status: Date Acquired: 09/02/2015 Comorbid Arrhythmia, Congestive Heart Weeks Of Treatment: 1 History: Failure, Type II Diabetes Clustered Wound: No Pending Amputation On Presentation Photos Photo  Uploaded By: Elpidio Eric on 10/11/2015 15:20:14 Wound Measurements Length: (cm) 1.5 Width: (cm) 1.5 Depth: (cm) 0.1 Area: (cm) 1.767 Volume: (cm) 0.177 % Reduction in Area: 6.3% % Reduction in Volume: 5.9% Epithelialization: None Tunneling: No Undermining: No Wound Description Classification: Unclassifiable Diabetic Severity [address]): Grade 1 Wound Margin: Flat and Intact Exudate Amount: None Present Foul Odor After Cleansing: No Wound Bed Granulation Amount: None Present (0%) Exposed Structure Necrotic  Amount: Large (67-100%) Fascia Exposed: No Necrotic Quality: Eschar Fat Layer Exposed: No Tendon Exposed: No GRENDA, LORA ([id number]) Muscle Exposed: No Joint Exposed: No Bone Exposed: No Limited to Skin Breakdown Periwound Skin Texture Texture Color No Abnormalities Noted: No No Abnormalities Noted: No Callus: No Atrophie Blanche: No Crepitus: No Cyanosis: No Excoriation: No Ecchymosis: No Fluctuance: No Erythema: Yes Friable: No Erythema Location: Circumferential Induration: No Hemosiderin Staining: No Localized Edema: Yes Mottled: No Rash: No Pallor: No Scarring: No Rubor: No Moisture Temperature / Pain No Abnormalities Noted: No Tenderness on Palpation: Yes Dry / Scaly: Yes Maceration: No Moist: No Wound Preparation Ulcer Cleansing: Rinsed/Irrigated with Saline Topical Anesthetic Applied: Other: lidocaine 4%, Treatment Notes Wound #3 (Right Calcaneus) 1. Cleansed with: Clean wound with Normal Saline 4. Dressing Applied: Santyl Ointment 5. Secondary Dressing Applied Bordered Foam Dressing Gauze and Kerlix/Conform 7. Secured with Tape Notes Betadine Paint to toes Electronic Signature(s) Signed: 10/11/2015 9:49:59 AM By: Elpidio Eric BSN, RN Entered By: Elpidio Eric on 10/11/2015 09:49:59 [name] ([id number]) -------------------------------------------------------------------------------- Wound Assessment Details Patient Name: [name] Date of Service: 10/11/2015 9:15 AM Medical Record Number: [id number] Patient Account Number: [id number] Date of Birth/Sex: 1933-06-09 (82 [name] Female) Treating RN: Afful, RN, BSN, [place] Sink Primary Care Physician: [name] Other Clinician: Referring Physician: [name] Treating Physician/Extender: Rudene Re in Treatment: 1 Wound Status Wound Number: 4 Primary Arterial Insufficiency Ulcer Etiology: Wound Location: Right Achilles Wound Open Wounding Event: Not  Known Status: Date Acquired: 09/02/2015 Comorbid Arrhythmia, Congestive Heart Weeks Of Treatment: 1 History: Failure, Type II Diabetes Clustered Wound: No Pending Amputation On Presentation Photos Photo Uploaded By: Elpidio Eric on 10/11/2015 15:20:57 Wound Measurements Length: (cm) 5.5 Width: (cm) 4.5 Depth: (cm) 0.1 Area: (cm) 19.439 Volume: (cm) 1.944 % Reduction in Area: -34.5% % Reduction in Volume: 55.2% Epithelialization: None Tunneling: No Wound Description Full Thickness Without Classification: Exposed Support Structures Diabetic Severity Grade 1 (Wagner): Wound Margin: Flat and Intact Exudate Amount: Medium Exudate Type: Serous Exudate Color: amber Foul Odor After Cleansing: No Wound Bed Granulation Amount: Small (1-33%) Exposed Structure BRINLYN, CENA ([id number]) Granulation Quality: Pink Fascia Exposed: No Necrotic Amount: Large (67-100%) Fat Layer Exposed: No Necrotic Quality: Eschar, Adherent Slough Tendon Exposed: No Muscle Exposed: No Joint Exposed: No Bone Exposed: No Limited to Skin Breakdown Periwound Skin Texture Texture Color No Abnormalities Noted: No No Abnormalities Noted: No Callus: No Atrophie Blanche: No Crepitus: No Cyanosis: No Excoriation: No Ecchymosis: No Fluctuance: No Erythema: Yes Friable: No Erythema Location: Circumferential Induration: No Hemosiderin Staining: No Localized Edema: Yes Mottled: No Rash: No Pallor: No Scarring: No Rubor: No Moisture Temperature / Pain No Abnormalities Noted: No Temperature: Cool/Cold Dry / Scaly: Yes Tenderness on Palpation: Yes Maceration: No Moist: Yes Wound Preparation Ulcer Cleansing: Rinsed/Irrigated with Saline Topical Anesthetic Applied: Other: lidocaine 4%, Treatment Notes Wound #4 (Right Achilles) 1. Cleansed with: Clean wound with Normal Saline 4. Dressing Applied: Santyl Ointment 5. Secondary Dressing Applied Bordered Foam Dressing Gauze and  Kerlix/Conform 7. Secured with Tape Notes Betadine Paint to toes  Electronic Signature(s) Signed: 10/11/2015 9:50:47 AM By: Elpidio Eric BSN, RN ANGELYNA, HENDERSON ([id number]) Entered By: Elpidio Eric on 10/11/2015 09:50:47 [name] ([id number]) -------------------------------------------------------------------------------- Wound Assessment Details Patient Name: SMANTHA, BOAKYE. Date of Service: 10/11/2015 9:15 AM Medical Record Number: [id number] Patient Account Number: [id number] Date of Birth/Sex: [birth year] (82 [name] Female) Treating RN: Afful, RN, BSN, [place] Sink Primary Care Physician: [name] Other Clinician: Referring Physician: [name] Treating Physician/Extender: Rudene Re in Treatment: 1 Wound Status Wound Number: 5 Primary Arterial Insufficiency Ulcer Etiology: Wound Location: Right Lower Leg - Lateral Wound Open Wounding Event: Not Known Status: Date Acquired: 09/02/2015 Comorbid Arrhythmia, Congestive Heart Weeks Of Treatment: 1 History: Failure, Type II Diabetes Clustered Wound: No Pending Amputation On Presentation Wound Measurements Length: (cm) 11 Width: (cm) 3.5 Depth: (cm) 0.2 Area: (cm) 30.238 Volume: (cm) 6.048 % Reduction in Area: 65.3% % Reduction in Volume: 65.3% Epithelialization: Small (1-33%) Tunneling: No Undermining: No Wound Description Full Thickness Without Foul Odor Af Classification: Exposed Support Structures Diabetic Severity Grade 1 (Wagner): Wound Margin: Flat and Intact Exudate Amount: Medium Exudate Type: Serous Exudate Color: amber ter Cleansing: No Wound Bed Granulation Amount: None Present (0%) Exposed Structure Necrotic Amount: Large (67-100%) Fascia Exposed: No Necrotic Quality: Eschar, Adherent Slough Fat Layer Exposed: No Tendon Exposed: No Muscle Exposed: No Joint Exposed: No Bone Exposed: No Limited to Skin Breakdown Periwound Skin Texture Texture Color No Abnormalities Noted:  No No Abnormalities Noted: No ETHEL, MEISENHEIMER ([id number]) Callus: No Atrophie Blanche: No Crepitus: No Cyanosis: No Excoriation: No Ecchymosis: No Fluctuance: No Erythema: Yes Friable: No Erythema Location: Circumferential Induration: No Hemosiderin Staining: No Localized Edema: Yes Mottled: No Rash: No Pallor: No Scarring: No Rubor: No Moisture Temperature / Pain No Abnormalities Noted: No Tenderness on Palpation: Yes Dry / Scaly: Yes Maceration: No Moist: Yes Wound Preparation Ulcer Cleansing: Rinsed/Irrigated with Saline Topical Anesthetic Applied: Other: lidocaine 4%, Treatment Notes Wound #5 (Right, Lateral Lower Leg) 1. Cleansed with: Clean wound with Normal Saline 4. Dressing Applied: Santyl Ointment 5. Secondary Dressing Applied Bordered Foam Dressing Gauze and Kerlix/Conform 7. Secured with Tape Notes Betadine Paint to toes Electronic Signature(s) Signed: 10/11/2015 9:51:08 AM By: Elpidio Eric BSN, RN Entered By: Elpidio Eric on 10/11/2015 09:51:07 [name] ([id number]) -------------------------------------------------------------------------------- Wound Assessment Details Patient Name: [name] Date of Service: 10/11/2015 9:15 AM Medical Record Number: [id number] Patient Account Number: [id number] Date of Birth/Sex: 10-05-1933 (82 [name] Female) Treating RN: Afful, RN, BSN, [place] Sink Primary Care Physician: [name] Other Clinician: Referring Physician: [name] Treating Physician/Extender: Rudene Re in Treatment: 1 Wound Status Wound Number: 6 Primary Trauma, Other Etiology: Wound Location: Left Abdomen - Lower Quadrant Wound Open Status: Wounding Event: Trauma Comorbid Arrhythmia, Congestive Heart Date Acquired: 06/10/2015 History: Failure, Type II Diabetes Weeks Of Treatment: 1 Clustered Wound: No Wound Measurements Length: (cm) 3 Width: (cm) 9 Depth: (cm) 0.1 Area: (cm) 21.206 Volume: (cm)  2.121 % Reduction in Area: 15.1% % Reduction in Volume: 15.1% Epithelialization: None Tunneling: No Undermining: No Wound Description Classification: Unclassifiable Foul Odor Af Wound Margin: Flat and Intact Exudate Amount: None Present ter Cleansing: No Wound Bed Granulation Amount: None Present (0%) Exposed Structure Necrotic Amount: Large (67-100%) Fascia Exposed: No Necrotic Quality: Eschar Fat Layer Exposed: No Tendon Exposed: No Muscle Exposed: No Joint Exposed: No Bone Exposed: No Limited to Skin Breakdown Periwound Skin Texture Texture Color No Abnormalities Noted: No No Abnormalities Noted: No Callus: No Atrophie Blanche: No Crepitus: No Cyanosis: No  Excoriation: No Ecchymosis: No Fluctuance: No Erythema: Yes Friable: No Erythema Location: Circumferential ANICA, ALCARAZ ([id number]) Induration: No Hemosiderin Staining: No Localized Edema: Yes Mottled: No Rash: No Pallor: No Scarring: No Rubor: No Moisture Temperature / Pain No Abnormalities Noted: No Tenderness on Palpation: Yes Dry / Scaly: Yes Maceration: No Moist: No Wound Preparation Ulcer Cleansing: Rinsed/Irrigated with Saline Topical Anesthetic Applied: Other: lidocaine 4%, Treatment Notes Wound #6 (Left Abdomen - Lower Quadrant) 1. Cleansed with: Clean wound with Normal Saline 4. Dressing Applied: Santyl Ointment 5. Secondary Dressing Applied Bordered Foam Dressing Gauze and Kerlix/Conform 7. Secured with Tape Notes Betadine Paint to toes Electronic Signature(s) Signed: 10/11/2015 9:51:31 AM By: Elpidio Eric BSN, RN Entered By: Elpidio Eric on 10/11/2015 09:51:31 [name] ([id number]) -------------------------------------------------------------------------------- Wound Assessment Details Patient Name: [name] Date of Service: 10/11/2015 9:15 AM Medical Record Number: [id number] Patient Account Number: [id number] Date of Birth/Sex: 1933/10/27 (82 [name]  Female) Treating RN: Afful, RN, BSN, [place] Primary Care Physician: [name] Other Clinician: Referring Physician: [name] Treating Physician/Extender: Rudene Re in Treatment: 1 Wound Status Wound Number: 7 Primary Trauma, Other Etiology: Wound Location: Abdomen - midline - Distal Wound Open Wounding Event: Trauma Status: Date Acquired: 08/12/2015 Comorbid Arrhythmia, Congestive Heart Weeks Of Treatment: 1 History: Failure, Type II Diabetes Clustered Wound: No Photos Photo Uploaded By: Elpidio Eric on 10/11/2015 15:21:57 Wound Measurements Length: (cm) 4.5 Width: (cm) 2.5 Depth: (cm) 0.2 Area: (cm) 8.836 Volume: (cm) 1.767 % Reduction in Area: -234.8% % Reduction in Volume: -234.7% Epithelialization: None Tunneling: No Undermining: No Wound Description Classification: Unclassifiable Wound Margin: Flat and Intact Exudate Amount: Medium Exudate Type: Serous Exudate Color: amber Foul Odor After Cleansing: No Wound Bed Granulation Amount: None Present (0%) Exposed Structure Necrotic Amount: Large (67-100%) Fascia Exposed: No Necrotic Quality: Eschar, Adherent Slough Fat Layer Exposed: No Tendon Exposed: No RACQUEL, ARKIN ([id number]) Muscle Exposed: No Joint Exposed: No Bone Exposed: No Limited to Skin Breakdown Periwound Skin Texture Texture Color No Abnormalities Noted: No No Abnormalities Noted: No Callus: No Atrophie Blanche: No Crepitus: No Cyanosis: No Excoriation: No Ecchymosis: No Fluctuance: No Erythema: Yes Friable: No Erythema Location: Circumferential Induration: No Hemosiderin Staining: No Localized Edema: No Mottled: No Rash: No Pallor: No Scarring: No Rubor: No Moisture Temperature / Pain No Abnormalities Noted: No Tenderness on Palpation: Yes Dry / Scaly: No Maceration: No Moist: Yes Wound Preparation Ulcer Cleansing: Rinsed/Irrigated with Saline Topical Anesthetic Applied: Other: lidocaine  4%, Treatment Notes Wound #7 (Distal Abdomen - midline) 1. Cleansed with: Clean wound with Normal Saline 4. Dressing Applied: Santyl Ointment 5. Secondary Dressing Applied Bordered Foam Dressing Gauze and Kerlix/Conform 7. Secured with Tape Notes Betadine Paint to toes Electronic Signature(s) Signed: 10/11/2015 3:35:42 PM By: Elpidio Eric BSN, RN Entered By: Elpidio Eric on 10/11/2015 09:53:03 [name] ([id number]) -------------------------------------------------------------------------------- Vitals Details Patient Name: [name] Date of Service: 10/11/2015 9:15 AM Medical Record Number: [id number] Patient Account Number: [id number] Date of Birth/Sex: [birth year] (82 [name] Female) Treating RN: Afful, RN, BSN, [place] Sink Primary Care Physician: [name] Other Clinician: Referring Physician: [name] Treating Physician/Extender: Rudene Re in Treatment: 1 Vital Signs Time Taken: 09:30 Temperature (F): 97.6 Height (in): 65 Pulse (bpm): 70 Weight (lbs): 159 Respiratory Rate (breaths/min): 16 Body Mass Index (BMI): 26.5 Blood Pressure (mmHg): 115/48 Reference Range: 80 - 120 mg / dl Electronic Signature(s) Signed: 10/11/2015 3:35:42 PM By: Elpidio Eric BSN, RN Entered By: Elpidio Eric on 10/11/2015 09:31:01

## 2015-10-12 ENCOUNTER — Emergency Department: Payer: Medicare Other

## 2015-10-12 ENCOUNTER — Encounter: Payer: Self-pay | Admitting: Emergency Medicine

## 2015-10-12 ENCOUNTER — Inpatient Hospital Stay
Admission: EM | Admit: 2015-10-12 | Discharge: 2015-10-15 | DRG: 555 | Disposition: A | Discharge status: Home / Self Care | Payer: Medicare Other | Attending: Internal Medicine | Admitting: Internal Medicine

## 2015-10-12 DIAGNOSIS — Z833 Family history of diabetes mellitus: Secondary | ICD-10-CM

## 2015-10-12 DIAGNOSIS — M7981 Nontraumatic hematoma of soft tissue: Principal | ICD-10-CM | POA: Diagnosis present

## 2015-10-12 DIAGNOSIS — E892 Postprocedural hypoparathyroidism: Secondary | ICD-10-CM | POA: Diagnosis present

## 2015-10-12 DIAGNOSIS — G2581 Restless legs syndrome: Secondary | ICD-10-CM | POA: Diagnosis present

## 2015-10-12 DIAGNOSIS — E1122 Type 2 diabetes mellitus with diabetic chronic kidney disease: Secondary | ICD-10-CM | POA: Diagnosis present

## 2015-10-12 DIAGNOSIS — Z7982 Long term (current) use of aspirin: Secondary | ICD-10-CM | POA: Diagnosis not present

## 2015-10-12 DIAGNOSIS — I5032 Chronic diastolic (congestive) heart failure: Secondary | ICD-10-CM | POA: Diagnosis present

## 2015-10-12 DIAGNOSIS — Z992 Dependence on renal dialysis: Secondary | ICD-10-CM

## 2015-10-12 DIAGNOSIS — Z89422 Acquired absence of other left toe(s): Secondary | ICD-10-CM | POA: Diagnosis not present

## 2015-10-12 DIAGNOSIS — E890 Postprocedural hypothyroidism: Secondary | ICD-10-CM | POA: Diagnosis present

## 2015-10-12 DIAGNOSIS — I739 Peripheral vascular disease, unspecified: Secondary | ICD-10-CM | POA: Diagnosis present

## 2015-10-12 DIAGNOSIS — S301XXA Contusion of abdominal wall, initial encounter: Secondary | ICD-10-CM | POA: Diagnosis present

## 2015-10-12 DIAGNOSIS — I482 Chronic atrial fibrillation: Secondary | ICD-10-CM | POA: Diagnosis present

## 2015-10-12 DIAGNOSIS — Z823 Family history of stroke: Secondary | ICD-10-CM

## 2015-10-12 DIAGNOSIS — Z9889 Other specified postprocedural states: Secondary | ICD-10-CM

## 2015-10-12 DIAGNOSIS — E785 Hyperlipidemia, unspecified: Secondary | ICD-10-CM | POA: Diagnosis present

## 2015-10-12 DIAGNOSIS — E1140 Type 2 diabetes mellitus with diabetic neuropathy, unspecified: Secondary | ICD-10-CM | POA: Diagnosis present

## 2015-10-12 DIAGNOSIS — Z7901 Long term (current) use of anticoagulants: Secondary | ICD-10-CM | POA: Diagnosis not present

## 2015-10-12 DIAGNOSIS — S7010XA Contusion of unspecified thigh, initial encounter: Secondary | ICD-10-CM

## 2015-10-12 DIAGNOSIS — T148XXA Other injury of unspecified body region, initial encounter: Secondary | ICD-10-CM

## 2015-10-12 DIAGNOSIS — K219 Gastro-esophageal reflux disease without esophagitis: Secondary | ICD-10-CM | POA: Diagnosis present

## 2015-10-12 DIAGNOSIS — R195 Other fecal abnormalities: Secondary | ICD-10-CM

## 2015-10-12 DIAGNOSIS — I132 Hypertensive heart and chronic kidney disease with heart failure and with stage 5 chronic kidney disease, or end stage renal disease: Secondary | ICD-10-CM | POA: Diagnosis present

## 2015-10-12 DIAGNOSIS — F411 Generalized anxiety disorder: Secondary | ICD-10-CM | POA: Diagnosis present

## 2015-10-12 DIAGNOSIS — Z9049 Acquired absence of other specified parts of digestive tract: Secondary | ICD-10-CM | POA: Diagnosis not present

## 2015-10-12 DIAGNOSIS — M109 Gout, unspecified: Secondary | ICD-10-CM | POA: Diagnosis present

## 2015-10-12 DIAGNOSIS — D631 Anemia in chronic kidney disease: Secondary | ICD-10-CM | POA: Diagnosis present

## 2015-10-12 DIAGNOSIS — D62 Acute posthemorrhagic anemia: Secondary | ICD-10-CM | POA: Diagnosis present

## 2015-10-12 DIAGNOSIS — Z9842 Cataract extraction status, left eye: Secondary | ICD-10-CM

## 2015-10-12 DIAGNOSIS — Z8673 Personal history of transient ischemic attack (TIA), and cerebral infarction without residual deficits: Secondary | ICD-10-CM | POA: Diagnosis not present

## 2015-10-12 DIAGNOSIS — Z886 Allergy status to analgesic agent status: Secondary | ICD-10-CM

## 2015-10-12 DIAGNOSIS — Z800 Family history of malignant neoplasm of digestive organs: Secondary | ICD-10-CM | POA: Diagnosis not present

## 2015-10-12 DIAGNOSIS — Z885 Allergy status to narcotic agent status: Secondary | ICD-10-CM

## 2015-10-12 DIAGNOSIS — N186 End stage renal disease: Secondary | ICD-10-CM | POA: Diagnosis present

## 2015-10-12 DIAGNOSIS — N3281 Overactive bladder: Secondary | ICD-10-CM | POA: Diagnosis present

## 2015-10-12 DIAGNOSIS — Z9841 Cataract extraction status, right eye: Secondary | ICD-10-CM

## 2015-10-12 DIAGNOSIS — Z79899 Other long term (current) drug therapy: Secondary | ICD-10-CM

## 2015-10-12 DIAGNOSIS — Z66 Do not resuscitate: Secondary | ICD-10-CM | POA: Diagnosis present

## 2015-10-12 DIAGNOSIS — I272 Other secondary pulmonary hypertension: Secondary | ICD-10-CM | POA: Diagnosis present

## 2015-10-12 DIAGNOSIS — N2581 Secondary hyperparathyroidism of renal origin: Secondary | ICD-10-CM | POA: Diagnosis present

## 2015-10-12 DIAGNOSIS — Z794 Long term (current) use of insulin: Secondary | ICD-10-CM | POA: Diagnosis not present

## 2015-10-12 DIAGNOSIS — R791 Abnormal coagulation profile: Secondary | ICD-10-CM | POA: Diagnosis present

## 2015-10-12 DIAGNOSIS — Z91041 Radiographic dye allergy status: Secondary | ICD-10-CM

## 2015-10-12 LAB — COMPREHENSIVE METABOLIC PANEL
ALBUMIN: 2.7 g/dL — AB (ref 3.5–5.0)
ALT: 11 U/L — AB (ref 14–54)
AST: 18 U/L (ref 15–41)
Alkaline Phosphatase: 59 U/L (ref 38–126)
Anion gap: 16 — ABNORMAL HIGH (ref 5–15)
BUN: 96 mg/dL — AB (ref 6–20)
CHLORIDE: 95 mmol/L — AB (ref 101–111)
CO2: 22 mmol/L (ref 22–32)
Calcium: 7.9 mg/dL — ABNORMAL LOW (ref 8.9–10.3)
Creatinine, Ser: 4.99 mg/dL — ABNORMAL HIGH (ref 0.44–1.00)
GFR calc Af Amer: 9 mL/min — ABNORMAL LOW (ref >60)
GFR, EST NON AFRICAN AMERICAN: 7 mL/min — AB (ref >60)
Glucose, Bld: 166 mg/dL — ABNORMAL HIGH (ref 65–99)
POTASSIUM: 3.9 mmol/L (ref 3.5–5.1)
SODIUM: 133 mmol/L — AB (ref 135–145)
Total Bilirubin: 0.4 mg/dL (ref 0.3–1.2)
Total Protein: 6.7 g/dL (ref 6.5–8.1)

## 2015-10-12 LAB — CBC WITH DIFFERENTIAL/PLATELET
BASOS ABS: 0.1 10*3/uL (ref 0–0.1)
BASOS PCT: 2 %
EOS ABS: 0 10*3/uL (ref 0–0.7)
EOS PCT: 1 %
HCT: 21.8 % — ABNORMAL LOW (ref 35.0–47.0)
Hemoglobin: 7.2 g/dL — ABNORMAL LOW (ref 12.0–16.0)
Lymphocytes Relative: 12 %
Lymphs Abs: 0.5 10*3/uL — ABNORMAL LOW (ref 1.0–3.6)
MCH: 30 pg (ref 26.0–34.0)
MCHC: 32.9 g/dL (ref 32.0–36.0)
MCV: 91.2 fL (ref 80.0–100.0)
MONO ABS: 0.8 10*3/uL (ref 0.2–0.9)
Monocytes Relative: 19 %
Neutro Abs: 3 10*3/uL (ref 1.4–6.5)
Neutrophils Relative %: 66 %
PLATELETS: 299 10*3/uL (ref 150–440)
RBC: 2.39 MIL/uL — AB (ref 3.80–5.20)
RDW: 17.5 % — AB (ref 11.5–14.5)
WBC: 4.5 10*3/uL (ref 3.6–11.0)

## 2015-10-12 LAB — PROTIME-INR
INR: 4.91 — AB
PROTHROMBIN TIME: 44.3 s — AB (ref 11.4–15.0)

## 2015-10-12 LAB — HEMOGLOBIN: Hemoglobin: 6.9 g/dL — ABNORMAL LOW (ref 12.0–16.0)

## 2015-10-12 LAB — APTT: APTT: 83 s — AB (ref 24–36)

## 2015-10-12 LAB — PHOSPHORUS: PHOSPHORUS: 6.5 mg/dL — AB (ref 2.5–4.6)

## 2015-10-12 LAB — GLUCOSE, CAPILLARY
GLUCOSE-CAPILLARY: 104 mg/dL — AB (ref 65–99)
GLUCOSE-CAPILLARY: 95 mg/dL (ref 65–99)

## 2015-10-12 MED ORDER — DIPHENHYDRAMINE HCL 25 MG PO CAPS: 25.0000 mg | ORAL_CAPSULE | Freq: Four times a day (QID) | ORAL | Status: DC | PRN

## 2015-10-12 MED ORDER — ALBUTEROL SULFATE (2.5 MG/3ML) 0.083% IN NEBU: 2.5000 mg | INHALATION_SOLUTION | Freq: Four times a day (QID) | RESPIRATORY_TRACT | Status: DC | PRN

## 2015-10-12 MED ORDER — EPOETIN ALFA 4000 UNIT/ML IJ SOLN
4000.0000 [IU] | Freq: Once | INTRAMUSCULAR | Status: AC
  Administered 2015-10-12: 4000 [IU] via INTRAVENOUS
  Filled 2015-10-12: qty 1

## 2015-10-12 MED ORDER — TRAMADOL HCL 50 MG PO TABS
50.0000 mg | ORAL_TABLET | Freq: Two times a day (BID) | ORAL | Status: DC | PRN
  Administered 2015-10-12: 100 mg via ORAL
  Administered 2015-10-15 (×2): 50 mg via ORAL
  Filled 2015-10-12: qty 2
  Filled 2015-10-12 (×2): qty 1

## 2015-10-12 MED ORDER — PRAVASTATIN SODIUM 20 MG PO TABS
20.0000 mg | ORAL_TABLET | Freq: Every day | ORAL | Status: DC
  Administered 2015-10-13 – 2015-10-15 (×3): 20 mg via ORAL
  Filled 2015-10-12 (×3): qty 1

## 2015-10-12 MED ORDER — RENA-VITE PO TABS
1.0000 | ORAL_TABLET | Freq: Every day | ORAL | Status: DC
  Administered 2015-10-13 – 2015-10-15 (×3): 1 via ORAL
  Filled 2015-10-12 (×3): qty 1

## 2015-10-12 MED ORDER — LIDOCAINE-PRILOCAINE 2.5-2.5 % EX CREA
1.0000 "application " | TOPICAL_CREAM | CUTANEOUS | Status: DC | PRN
  Filled 2015-10-12: qty 5

## 2015-10-12 MED ORDER — NYSTATIN 100000 UNIT/GM EX POWD
Freq: Two times a day (BID) | CUTANEOUS | Status: DC
  Administered 2015-10-13 – 2015-10-15 (×5): via TOPICAL
  Filled 2015-10-12: qty 60

## 2015-10-12 MED ORDER — GABAPENTIN 100 MG PO CAPS
100.0000 mg | ORAL_CAPSULE | ORAL | Status: DC
  Administered 2015-10-13 – 2015-10-15 (×2): 100 mg via ORAL
  Filled 2015-10-12 (×2): qty 1

## 2015-10-12 MED ORDER — DOCUSATE SODIUM 100 MG PO CAPS: 200.0000 mg | ORAL_CAPSULE | Freq: Two times a day (BID) | ORAL | Status: DC | PRN

## 2015-10-12 MED ORDER — FUROSEMIDE 40 MG PO TABS
80.0000 mg | ORAL_TABLET | Freq: Two times a day (BID) | ORAL | Status: DC
  Administered 2015-10-13 – 2015-10-15 (×4): 80 mg via ORAL
  Filled 2015-10-12 (×4): qty 2

## 2015-10-12 MED ORDER — BARIUM SULFATE 2.1 % PO SUSP: 450.0000 mL | ORAL | Status: AC

## 2015-10-12 MED ORDER — INSULIN ASPART 100 UNIT/ML ~~LOC~~ SOLN: 0.0000 [IU] | Freq: Three times a day (TID) | SUBCUTANEOUS | Status: DC

## 2015-10-12 MED ORDER — BIOTIN 1 MG PO CAPS: 1.0000 mg | ORAL_CAPSULE | Freq: Every day | ORAL | Status: DC

## 2015-10-12 MED ORDER — SENNOSIDES-DOCUSATE SODIUM 8.6-50 MG PO TABS
1.0000 | ORAL_TABLET | Freq: Two times a day (BID) | ORAL | Status: DC
  Administered 2015-10-13 – 2015-10-15 (×6): 1 via ORAL
  Filled 2015-10-12 (×6): qty 1

## 2015-10-12 MED ORDER — FAMOTIDINE 20 MG PO TABS
10.0000 mg | ORAL_TABLET | Freq: Two times a day (BID) | ORAL | Status: DC
  Administered 2015-10-13 – 2015-10-15 (×6): 10 mg via ORAL
  Filled 2015-10-12 (×6): qty 1

## 2015-10-12 MED ORDER — INSULIN DETEMIR 100 UNIT/ML ~~LOC~~ SOLN
11.0000 [IU] | Freq: Two times a day (BID) | SUBCUTANEOUS | Status: DC
  Administered 2015-10-13 – 2015-10-14 (×4): 11 [IU] via SUBCUTANEOUS
  Filled 2015-10-12 (×6): qty 0.11

## 2015-10-12 MED ORDER — GABAPENTIN 300 MG PO CAPS
300.0000 mg | ORAL_CAPSULE | ORAL | Status: DC
  Administered 2015-10-13: 300 mg via ORAL
  Filled 2015-10-12: qty 1

## 2015-10-12 MED ORDER — CALCIUM ACETATE (PHOS BINDER) 667 MG/5ML PO SOLN
1334.0000 mg | Freq: Three times a day (TID) | ORAL | Status: DC
  Administered 2015-10-13 – 2015-10-15 (×5): 1334 mg via ORAL
  Filled 2015-10-12 (×11): qty 10

## 2015-10-12 MED ORDER — BISACODYL 5 MG PO TBEC: 10.0000 mg | DELAYED_RELEASE_TABLET | Freq: Every day | ORAL | Status: DC | PRN

## 2015-10-12 MED ORDER — OMEGA-3-ACID ETHYL ESTERS 1 G PO CAPS
1.0000 g | ORAL_CAPSULE | Freq: Two times a day (BID) | ORAL | Status: DC
Start: 2015-10-12 — End: 2015-10-15
  Administered 2015-10-13 – 2015-10-15 (×6): 1 g via ORAL
  Filled 2015-10-12 (×6): qty 1

## 2015-10-12 MED ORDER — PAROXETINE HCL 20 MG PO TABS
40.0000 mg | ORAL_TABLET | Freq: Every day | ORAL | Status: DC
  Administered 2015-10-13 – 2015-10-15 (×3): 40 mg via ORAL
  Filled 2015-10-12 (×3): qty 2

## 2015-10-12 MED ORDER — INSULIN ASPART 100 UNIT/ML ~~LOC~~ SOLN
0.0000 [IU] | Freq: Three times a day (TID) | SUBCUTANEOUS | Status: DC
  Administered 2015-10-13 – 2015-10-14 (×3): 1 [IU] via SUBCUTANEOUS
  Filled 2015-10-12 (×3): qty 1

## 2015-10-12 MED ORDER — DILTIAZEM HCL ER COATED BEADS 120 MG PO CP24
120.0000 mg | ORAL_CAPSULE | Freq: Every day | ORAL | Status: DC
  Administered 2015-10-13 – 2015-10-15 (×3): 120 mg via ORAL
  Filled 2015-10-12 (×7): qty 1

## 2015-10-12 MED ORDER — ALBUTEROL SULFATE HFA 108 (90 BASE) MCG/ACT IN AERS: 1.0000 | INHALATION_SPRAY | Freq: Four times a day (QID) | RESPIRATORY_TRACT | Status: DC | PRN

## 2015-10-12 NOTE — Progress Notes (Signed)
Subjective:  Received a call from patient's daughter that patient's INR is high at 7.7 when checked by Dr. Gavin Potters yesterday who is her primary care doctor. She informed dialysis staff who were going to hold her heparin for the next couple of treatments until INR comes down. This morning, patient noticed frank blood in her urine. She also complains of back pain. Plan: Bring patient to the emergency room for evaluation and possible CT scan. In ER, patient was found to have low Hgb of 7.2. INR 4.91 CT abdomen is highly suspicious for intramuscular bleed/ hematoma She is admitted for further evaluation and dialysis is requested  Objective:  Vital signs in last 24 hours:  Temp:  [97.9 F (36.6 C)] 97.9 F (36.6 C) (05/13 0936) Pulse Rate:  [69-92] 70 (05/13 1630) Resp:  [16-26] 21 (05/13 1630) BP: (98-132)/(41-64) 110/58 mmHg (05/13 1630) SpO2:  [91 %-100 %] 96 % (05/13 1630) Weight:  [72.576 kg (160 lb)] 72.576 kg (160 lb) (05/13 0936)  Weight change:  Filed Weights   10/12/15 0936  Weight: 72.576 kg (160 lb)    Intake/Output:   No intake or output data in the 24 hours ending 10/12/15 1700   Physical Exam: General: NAD, Laying in bed  HEENT Anicteric, moist oral mucus membranes  Neck supple  Pulm/lungs Clear b/l  CVS/Heart Irregular, no rub or gallop  Abdomen:  Soft, NT  Extremities: No peripheral edema  Neurologic: Alert, oriented  Skin: No acute rashes  Access: AVF       Basic Metabolic Panel:   Recent Labs Lab 10/12/15 1029  NA 133*  K 3.9  CL 95*  CO2 22  GLUCOSE 166*  BUN 96*  CREATININE 4.99*  CALCIUM 7.9*  PHOS 6.5*     CBC:  Recent Labs Lab 10/12/15 1029  WBC 4.5  NEUTROABS 3.0  HGB 7.2*  HCT 21.8*  MCV 91.2  PLT 299      Microbiology:  No results found for this or any previous visit (from the past 720 hour(s)).  Coagulation Studies:  Recent Labs  10/12/15 1029  LABPROT 44.3*  INR 4.91*    Urinalysis: No results for  input(s): COLORURINE, LABSPEC, PHURINE, GLUCOSEU, HGBUR, BILIRUBINUR, KETONESUR, PROTEINUR, UROBILINOGEN, NITRITE, LEUKOCYTESUR in the last 72 hours.  Invalid input(s): APPERANCEUR    Imaging: Ct Abdomen Pelvis Wo Contrast  10/12/2015  CLINICAL DATA:  Lower abdominal pain, back pain for 2 days, elevated INR EXAM: CT ABDOMEN AND PELVIS WITHOUT CONTRAST TECHNIQUE: Multidetector CT imaging of the abdomen and pelvis was performed following the standard protocol without IV contrast. COMPARISON:  09/14/2015 FINDINGS: Cardiomegaly is noted.  Mitral valve calcifications. Stable 8 mm nodule right base anteriorly. There is small left pleural effusion. Mild atelectasis bilateral lower lobe posteriorly. Study is limited without IV contrast. No intrahepatic biliary ductal dilatation. The unenhanced pancreas is unremarkable. Unenhanced spleen is unremarkable. Unenhanced kidneys shows bilateral cortical thinning. No adrenal gland mass is noted. There is nonobstructive calcified calculus in upper pole of the right kidney measures 1.4 cm. No hydronephrosis or hydroureter. No calcified ureteral calculi. The urinary bladder is obscured by extensive metallic artifacts from bilateral hip prosthesis. Oral contrast material was given to the patient. No small bowel obstruction. No gastric outlet obstruction. Moderate stool noted within cecum. No pericecal inflammation. The terminal ileum is unremarkable. Some colonic stool noted within transverse colon and descending colon. No distal colonic obstruction. Moderate stool noted within rectum. The rectum measures Extensive atherosclerotic calcifications of abdominal aorta, SMA, celiac  trunk, splenic artery, bilateral common iliac arteries. No aortic aneurysm. There is no retroperitoneal adenopathy. No mesenteric adenopathy. There is thickening of the right iliacus muscle just anterior to iliac bone. This is a new finding. Subtle hiatal density material is noted centrally within iliacus  muscle. Iliacus muscle measures at least 4.1 by 6 cm. Findings are highly suspicious for intramuscular bleed/hematoma. Study is limited without IV contrast. No destructive bony lesions are noted. Sagittal images of the spine shows extensive degenerative changes thoracolumbar spine. Significant disc space flattening with endplate sclerotic changes vacuum disc phenomenon anterior spurring at L2-L3 level. There is diffuse osteopenia. Degenerative changes bilateral SI joints. Extensive metallic artifacts from bilateral hip prosthesis. Mild anasarca infiltration of subcutaneous fat lateral pelvic wall. IMPRESSION: 1. There is asymmetric prominence of the right iliacus muscle right posterior pelvic sidewall just anterior to iliac bone. Measures at least 4.2 x 6.1 cm. Vague high-density material is noted centrally. Findings are highly suspicious for intramuscular bleed/ hematoma. No free fluid each in noted within pelvis. The study is limited without IV contrast. In coronal image 84 the intramuscular hematoma measures 7.8 cm cranial caudally by 5.6 cm. 2. Extensive metallic artifacts limits evaluation of the lower pelvis. Bilateral hip prosthesis. Degenerative changes thoracolumbar spine. 3. Stable 8 mm nodule in right base anteriorly. Bilateral small pleural effusion. Bilateral lower lobe posterior atelectasis. 4. Again noted bilateral renal cortical thinning. Right nonobstructive nephrolithiasis. No hydronephrosis or hydroureter. 5. No small bowel obstruction. Moderate stool noted in right colon. There is moderate distension of the rectum with stool up to 6.2 cm suspicious for mild fecal impaction. These results were called by telephone at the time of interpretation on 10/12/2015 at 3:47 pm to Dr. Ileana RoupJAMES MCSHANE , who verbally acknowledged these results. Electronically Signed   By: Natasha MeadLiviu  Pop M.D.   On: 10/12/2015 15:48     Medications:     . insulin aspart  0-9 Units Subcutaneous TID WC  . insulin aspart  0-9  Units Subcutaneous TID WC     Assessment/ Plan:  82 y.o. female with past medical history of atrial fibrillation on Coumadin, hypertension, gout, insulin-dependent diabetes mellitus type 2, diabetic neuropathy, CVA with right visual field blindness, hyperlipidemia, major depressive disorder, overactive bladder with incontinence, generalized anxiety disorder, congestive heart failure diastolic, parathyroidectomy, and osteoarthritis, s/p left CEA 07/26/15. Patient has never smoked.  [facility]   1. ESRD on HD TTHS:  Will arrange for her HD today and keep her on schedule  2. Anemia chronic kidney disease and blood loss - high INR - blood transfusion planned during HD - EPO with HD  3. Secondary hyperparathyroidism.  - continue calcium acetate          LOS: 0 Zorian Gunderman 5/13/20175:00 PM

## 2015-10-12 NOTE — H&P (Signed)
[facility] [place] at [facility]   PATIENT NAME: [name]    MR#:  [id number]  DATE OF BIRTH:  1933-11-06  DATE OF ADMISSION:  10/12/2015  PRIMARY CARE PHYSICIAN: [name], MD   REQUESTING/REFERRING PHYSICIAN: McShane  CHIEF COMPLAINT:   Chief Complaint  Patient presents with  . Abdominal Pain    HISTORY OF PRESENT ILLNESS: [name]  is a 82 y.o. 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 On Tuesday during dialysis her INR was noted to be around 4 so nephrologist suggested to decrease and take 2 mg of Coumadin on every Sunday and continue taking 4 mg 1 every other day as she was taking before. So since then for next 3 days she took 4 mg as she was advised to cut down Coumadin on Sunday ,she continue taking 4 mg, and on Friday, ( Yesterday) . PMDs office INR was more than 7, so he suggested not to take Coumadin for next 2 days and start taking 1.5 mg on Sunday and advised to follow up on Monday in the office for repeat check on INR.  Last night she complained of some lower back pain and some abdominal pain and this morning when she pulled down her diapers she noted a spot of blood in her urine, Concerned with this her daughter spoke to Dr. Thedore Mins, and he suggested to take her to ER instead of taking to dialysis Center. In ER on CT scan of the abdomen she was noted to have hematoma in her iliacus muscle, hemoglobin was stable and vitals were stable. So ER spoke to Dr. Thedore Mins and he suggested to admit and he will take for dialysis and given 1 unit of blood transfusion during the dialysis.  PAST MEDICAL HISTORY:   Past Medical History  Diagnosis Date  . Chronic kidney disease   . Dysrhythmia   . Diabetes  mellitus without complication (HCC)   . Hypertension   . Stroke (HCC)   . Arthritis     gout  . Dialysis patient (HCC)   . Pleural effusion   . Pulmonary hypertension (HCC)   . Renal insufficiency   . CHF (congestive heart failure) (HCC)   . Peripheral vascular disease (HCC)   . Shortness of breath dyspnea     with exertion  . GERD (gastroesophageal reflux disease)   . Anemia   . Restless leg syndrome   . Atrial fibrillation (HCC)     PAST SURGICAL HISTORY: Past Surgical History  Procedure Laterality Date  . Cholecystectomy    . Peripheral vascular catheterization N/A 10/08/2014    Procedure: A/V Shuntogram/Fistulagram;  Surgeon: Annice Needy, MD;  Location: [facility] INVASIVE CV LAB;  Service: Cardiovascular;  Laterality: N/A;  . Joint replacement      bilateral hip  . Peripheral vascular catheterization N/A 03/04/2015    Procedure: A/V Shuntogram/Fistulagram;  Surgeon: Annice Needy, MD;  Location: [facility] INVASIVE CV LAB;  Service: Cardiovascular;  Laterality: N/A;  . Peripheral vascular catheterization N/A 03/04/2015    Procedure: A/V Shunt Intervention;  Surgeon: Annice Needy, MD;  Location: [facility] INVASIVE CV LAB;  Service: Cardiovascular;  Laterality: N/A;  . Fistulagram ([facility] hx)    . Parathyroidectomy    . Tonsillectomy    . Cardiac catheterization    .  Eye surgery    . Cataract extraction, bilateral    . Amputation toe Left 06/07/2015    Procedure: AMPUTATION TOE;  Surgeon: Gwyneth RevelsJustin Fowler, DPM;  Location: [facility] ORS;  Service: Podiatry;  Laterality: Left;  Marland Kitchen. Medtr[employer] bladder interstem      currently turned off for MRI in January  . Endarterectomy Left 07/26/2015    Procedure: ENDARTERECTOMY CAROTID;  Surgeon: Renford DillsGregory G Schnier, MD;  Location: [facility] ORS;  Service: Vascular;  Laterality: Left;  . Thyroidectomy, partial    . Peripheral vascular catheterization N/A 09/16/2015    Procedure: Lower Extremity Angiography;  Surgeon: Annice NeedyJason S Dew, MD;  Location: [facility] INVASIVE CV LAB;  Service:  Cardiovascular;  Laterality: N/A;  . Peripheral vascular catheterization  09/16/2015    Procedure: Lower Extremity Intervention;  Surgeon: Annice NeedyJason S Dew, MD;  Location: [facility] INVASIVE CV LAB;  Service: Cardiovascular;;    SOCIAL HISTORY:  Social History  Substance Use Topics  . Smoking status: Never Smoker   . Smokeless tobacco: Never Used  . Alcohol Use: No    FAMILY HISTORY:  Family History  Problem Relation Age of Onset  . Colon cancer Mother   . Stroke Father   . Diabetes Brother     DRUG ALLERGIES:  Allergies  Allergen Reactions  . Codeine Other (See Comments)    Reaction:  Confusion/Hallucinations   . Contrast Media [Iodinated Diagnostic Agents] Other (See Comments)    "skin peel"  . Oxycodone Other (See Comments)    Reaction:  Confusion/Hallucinations   . Quinine Derivatives Rash    REVIEW OF SYSTEMS:   CONSTITUTIONAL: No fever, fatigue or weakness.  EYES: No blurred or double vision.  EARS, NOSE, AND THROAT: No tinnitus or ear pain.  RESPIRATORY: No cough, shortness of breath, wheezing or hemoptysis.  CARDIOVASCULAR: No chest pain, orthopnea, edema.  GASTROINTESTINAL: No nausea, vomiting, diarrhea or abdominal pain.  GENITOURINARY: No dysuria, hematuria.  ENDOCRINE: No polyuria, nocturia,  HEMATOLOGY: No anemia, easy bruising or bleeding SKIN: No rash or lesion. MUSCULOSKELETAL: No joint pain or arthritis.   NEUROLOGIC: No tingling, numbness, weakness.  PSYCHIATRY: No anxiety or depression.   MEDICATIONS AT HOME:  Prior to Admission medications   Medication Sig Start Date End Date Taking? Authorizing Provider  acetaminophen (TYLENOL) 500 MG tablet Take 1,000 mg by mouth 2 (two) times daily.     Historical Provider, MD  albuterol (PROVENTIL HFA;VENTOLIN HFA) 108 (90 Base) MCG/ACT inhaler Inhale 1-2 puffs into the lungs every 6 (six) hours as needed for wheezing or shortness of breath.    Historical Provider, MD  aspirin 81 MG chewable tablet Chew 1 tablet (81  mg total) by mouth daily. 06/24/15   Katharina Caperima Vaickute, MD  Biotin 1 MG CAPS Take 1 mg by mouth daily.     Historical Provider, MD  bisacodyl (DULCOLAX) 5 MG EC tablet Take 10 mg by mouth daily as needed for moderate constipation.     Historical Provider, MD  calcium acetate, Phos Binder, (PHOSLYRA) 667 MG/5ML SOLN Take 1,334 mg by mouth 3 (three) times daily with meals.    Historical Provider, MD  diltiazem (TIAZAC) 120 MG 24 hr capsule Take 120 mg by mouth daily.    Historical Provider, MD  diphenhydrAMINE (BENADRYL) 25 mg capsule Take 1 capsule (25 mg total) by mouth every 6 (six) hours as needed (rash, itching). 06/24/15   Katharina Caperima Vaickute, MD  docusate sodium (COLACE) 100 MG capsule Take 200 mg by mouth 2 (two) times daily as needed for  mild constipation.    Historical Provider, MD  folic acid-vitamin b complex-vitamin c-selenium-zinc (DIALYVITE) 3 MG TABS tablet Take 1 tablet by mouth daily.    Historical Provider, MD  furosemide (LASIX) 80 MG tablet Take 80 mg by mouth 2 (two) times daily.    Historical Provider, MD  gabapentin (NEURONTIN) 100 MG capsule Take 100 mg by mouth at bedtime. Pt takes on Sunday, Monday, Wednesday, and Friday.    Historical Provider, MD  gabapentin (NEURONTIN) 300 MG capsule Take 300 mg by mouth daily. Pt takes on Tuesday, Thursday, and Saturday after dialysis.    Historical Provider, MD  Insulin Detemir (LEVEMIR) 100 UNIT/ML Pen Inject 11 Units into the skin 2 (two) times daily. 0730, 1730    Historical Provider, MD  lidocaine-prilocaine (EMLA) cream Apply 1 application topically as needed (prior to accessing port).     Historical Provider, MD  nystatin (MYCOSTATIN/NYSTOP) 100000 UNIT/GM POWD Apply topically 2 (two) times daily as needed.    Historical Provider, MD  Omega-3 Fatty Acids (FISH OIL CONCENTRATE) 300 MG CAPS Take 1 capsule by mouth daily. 11/04/10   Historical Provider, MD  PARoxetine (PAXIL) 40 MG tablet Take 40 mg by mouth at bedtime.     Historical Provider, MD   pravastatin (PRAVACHOL) 20 MG tablet Take 1 tablet (20 mg total) by mouth daily. 07/27/15   Kimberly A Stegmayer, PA-C  ranitidine (ZANTAC) 150 MG tablet Take 150 mg by mouth 2 (two) times daily.     Historical Provider, MD  senna-docusate (SENOKOT-S) 8.6-50 MG tablet Take 1 tablet by mouth 2 (two) times daily as needed.     Historical Provider, MD  traMADol (ULTRAM) 50 MG tablet Take 1 tablet (50 mg total) by mouth every 6 (six) hours as needed for moderate pain. 09/18/15   Milagros Loll, MD  warfarin (COUMADIN) 3 MG tablet Take 1.5 mg by mouth daily. Everyday except Mondays    Historical Provider, MD      PHYSICAL EXAMINATION:   VITAL SIGNS: Blood pressure 130/50, pulse 92, temperature 97.9 F (36.6 C), temperature source Oral, resp. rate 18, height  (1.651 m), weight 72.576 kg (160 lb), SpO2 95 %.  GENERAL:  82 y.o.-year-old patient lying in the bed with no acute distress.  EYES: Pupils equal, round, reactive to light and accommodation. No scleral icterus. Extraocular muscles intact.  HEENT: Head atraumatic, normocephalic. Oropharynx and nasopharynx clear.  NECK:  Supple, no jugular venous distention. No thyroid enlargement, no tenderness.  LUNGS: Normal breath sounds bilaterally, no wheezing, rales,rhonchi or crepitation. No use of accessory muscles of respiration.  CARDIOVASCULAR: S1, S2 normal. No murmurs, rubs, or gallops.  ABDOMEN: Soft, nontender, nondistended. Bowel sounds present. No organomegaly or mass.  EXTREMITIES: No pedal edema, cyanosis, or clubbing. Dressing present on the right leg because of ulcers.  NEUROLOGIC: Cranial nerves II through XII are intact. Muscle strength 5/5 in all extremities. Sensation intact. Gait not checked.  PSYCHIATRIC: The patient is alert and oriented x 3.  SKIN: No obvious rash, lesion, or ulcer.   LABORATORY PANEL:   CBC  Recent Labs Lab 10/12/15 1029  WBC 4.5  HGB 7.2*  HCT 21.8*  PLT 299  MCV 91.2  MCH 30.0  MCHC 32.9  RDW  17.5*  LYMPHSABS 0.5*  MONOABS 0.8  EOSABS 0.0  BASOSABS 0.1   ------------------------------------------------------------------------------------------------------------------  Chemistries   Recent Labs Lab 10/12/15 1029  NA 133*  K 3.9  CL 95*  CO2 22  GLUCOSE 166*  BUN 96*  CREATININE 4.99*  CALCIUM 7.9*  AST 18  ALT 11*  ALKPHOS 59  BILITOT 0.4   ------------------------------------------------------------------------------------------------------------------ estimated creatinine clearance is 9.1 mL/min (by C-G formula based on Cr of 4.99). ------------------------------------------------------------------------------------------------------------------ No results for input(s): TSH, T4TOTAL, T3FREE, THYROIDAB in the last 72 hours.  Invalid input(s): FREET3   Coagulation profile  Recent Labs Lab 10/12/15 1029  INR 4.91*   ------------------------------------------------------------------------------------------------------------------- No results for input(s): DDIMER in the last 72 hours. -------------------------------------------------------------------------------------------------------------------  Cardiac Enzymes No results for input(s): CKMB, TROPONINI, MYOGLOBIN in the last 168 hours.  Invalid input(s): CK ------------------------------------------------------------------------------------------------------------------ Invalid input(s): POCBNP  ---------------------------------------------------------------------------------------------------------------  Urinalysis No results found for: COLORURINE, APPEARANCEUR, LABSPEC, PHURINE, GLUCOSEU, HGBUR, BILIRUBINUR, KETONESUR, PROTEINUR, UROBILINOGEN, NITRITE, LEUKOCYTESUR   RADIOLOGY: Ct Abdomen Pelvis Wo Contrast  10/12/2015  CLINICAL DATA:  Lower abdominal pain, back pain for 2 days, elevated INR EXAM: CT ABDOMEN AND PELVIS WITHOUT CONTRAST TECHNIQUE: Multidetector CT imaging of the abdomen and  pelvis was performed following the standard protocol without IV contrast. COMPARISON:  09/14/2015 FINDINGS: Cardiomegaly is noted.  Mitral valve calcifications. Stable 8 mm nodule right base anteriorly. There is small left pleural effusion. Mild atelectasis bilateral lower lobe posteriorly. Study is limited without IV contrast. No intrahepatic biliary ductal dilatation. The unenhanced pancreas is unremarkable. Unenhanced spleen is unremarkable. Unenhanced kidneys shows bilateral cortical thinning. No adrenal gland mass is noted. There is nonobstructive calcified calculus in upper pole of the right kidney measures 1.4 cm. No hydronephrosis or hydroureter. No calcified ureteral calculi. The urinary bladder is obscured by extensive metallic artifacts from bilateral hip prosthesis. Oral contrast material was given to the patient. No small bowel obstruction. No gastric outlet obstruction. Moderate stool noted within cecum. No pericecal inflammation. The terminal ileum is unremarkable. Some colonic stool noted within transverse colon and descending colon. No distal colonic obstruction. Moderate stool noted within rectum. The rectum measures Extensive atherosclerotic calcifications of abdominal aorta, SMA, celiac trunk, splenic artery, bilateral common iliac arteries. No aortic aneurysm. There is no retroperitoneal adenopathy. No mesenteric adenopathy. There is thickening of the right iliacus muscle just anterior to iliac bone. This is a new finding. Subtle hiatal density material is noted centrally within iliacus muscle. Iliacus muscle measures at least 4.1 by 6 cm. Findings are highly suspicious for intramuscular bleed/hematoma. Study is limited without IV contrast. No destructive bony lesions are noted. Sagittal images of the spine shows extensive degenerative changes thoracolumbar spine. Significant disc space flattening with endplate sclerotic changes vacuum disc phenomenon anterior spurring at L2-L3 level. There is  diffuse osteopenia. Degenerative changes bilateral SI joints. Extensive metallic artifacts from bilateral hip prosthesis. Mild anasarca infiltration of subcutaneous fat lateral pelvic wall. IMPRESSION: 1. There is asymmetric prominence of the right iliacus muscle right posterior pelvic sidewall just anterior to iliac bone. Measures at least 4.2 x 6.1 cm. Vague high-density material is noted centrally. Findings are highly suspicious for intramuscular bleed/ hematoma. No free fluid each in noted within pelvis. The study is limited without IV contrast. In coronal image 84 the intramuscular hematoma measures 7.8 cm cranial caudally by 5.6 cm. 2. Extensive metallic artifacts limits evaluation of the lower pelvis. Bilateral hip prosthesis. Degenerative changes thoracolumbar spine. 3. Stable 8 mm nodule in right base anteriorly. Bilateral small pleural effusion. Bilateral lower lobe posterior atelectasis. 4. Again noted bilateral renal cortical thinning. Right nonobstructive nephrolithiasis. No hydronephrosis or hydroureter. 5. No small bowel obstruction. Moderate stool noted in right colon. There is moderate distension of the rectum with stool up to 6.2 cm  suspicious for mild fecal impaction. These results were called by telephone at the time of interpretation on 10/12/2015 at 3:47 pm to Dr. Ileana Roup , who verbally acknowledged these results. Electronically Signed   By: Natasha Mead M.D.   On: 10/12/2015 15:48    EKG: Orders placed or performed during the hospital encounter of 09/14/15  . ED EKG  . ED EKG  . EKG 12-Lead  . EKG 12-Lead    IMPRESSION AND PLAN:  * Hematoma   Intra-abdominal and confined in the muscle, with her currently stable hemoglobin. Vitals stable.   As patient has a history of vascular thrombosis in the arteries I would like not to reverse her INR and Coumadin effect at this time but continuously watching for vitals drop or drop in the hemoglobin. ER physician suggested this and I  agree with that.  We will monitor her hemoglobin every 8 hours.  She is supposed receive 1 unit of blood transfusion during her dialysis today.   * Acute on chronic anemia due to blood loss and hematoma     for transfusion during dialysis.   discussed with pt about possible side effects of blood transfusion- including more common like low grade fever to rare and serious like renal and lung involvement and infections.  SHe understood- and due to necessity of transfusion- agreed to receive the transfusion.  * End-stage renal disease on hemodialysis Nephrology consult to help.  * Atrial fibrillation   Rate is under control, continue calcium channel blocker, hold Coumadin because of active bleed.   *  diabetes    Continue basal insulin, give insulin sliding scale coverage.   * Hypertension   Continue home medications.  All the records are reviewed and case discussed with ED provider. Management plans discussed with the patient, family and they are in agreement.  CODE STATUS: DO NOT RESUSCITATE  Code Status History    Date Active Date Inactive Code Status Order ID Comments User Context   09/14/2015 10:46 PM 09/18/2015  8:40 PM Full Code [id number]  Marguarite Arbour, MD Inpatient   08/13/2015  1:01 PM 08/16/2015  4:09 PM Full Code [id number]  Altamese Dilling, MD Inpatient   06/18/2015 11:41 PM 06/25/2015  1:04 AM DNR [id number]  Ramonita Lab, MD Inpatient   06/07/2015  3:46 PM 06/07/2015  6:48 PM Full Code [id number]  Gwyneth Revels, DPM Inpatient   12/11/2014 10:46 PM 12/13/2014  9:38 PM DNR [id number]  Enedina Finner, MD Inpatient   12/11/2014  9:10 PM 12/11/2014 10:46 PM DNR [id number]  Enedina Finner, MD ED       TOTAL TIME TAKING CARE OF THIS PATIENT: 50 minutes.    patient's healthcare power of attorney her daughter is present in the room during my discussion.   Altamese Dilling M.D on 10/12/2015   Between 7am to 6pm - Pager - [phone]  After 6pm go to [email or web address] - password [email or web address]  [facility]  Office  [phone]  CC: Primary care physician; [name], MD   Note: This dictation was prepared with Dragon dictation along with smaller phrase technology. Any transcriptional errors that result from this process are unintentional.

## 2015-10-12 NOTE — Progress Notes (Signed)

## 2015-10-12 NOTE — ED Notes (Signed)
Lower abdominal and back pain x 2 days. Denies fevers. Was seen at [facility] yesterday and called re elevated INR, 7.7. Was told to hold coumadin x 2 days and has dose adjustment instructions for after that. States blood in urine this am.

## 2015-10-12 NOTE — ED Notes (Signed)
Patient transported to CT 

## 2015-10-12 NOTE — Progress Notes (Signed)
Post dialysis assessment 

## 2015-10-12 NOTE — Progress Notes (Signed)
PRE DIALYSIS ASSESSMENT 

## 2015-10-12 NOTE — Progress Notes (Signed)
This note also relates to the following rows which could not be included: Pulse Rate - Cannot attach notes to unvalidated device data Resp - Cannot attach notes to unvalidated device data BP - Cannot attach notes to unvalidated device data  Hd completed  

## 2015-10-12 NOTE — ED Notes (Signed)
Attempted to call report x 1  

## 2015-10-12 NOTE — ED Notes (Signed)
[name]  [phone]

## 2015-10-12 NOTE — ED Provider Notes (Addendum)
[facility] Emergency Department Provider Note  ____________________________________________   I have reviewed the triage vital signs and the nursing notes.   HISTORY  Chief Complaint Abdominal Pain    HPI [name] is a 82 y.o. female on Coumadin for A. fib as well as a recent arterial clot in her right lower extremity. She is a Monday was a Friday dialysis patient. Briefly, her Coumadin levels were increased in dosing, and she was found to have an INR of 7.7 a few days ago. She has not had any bleeding, she does have chronic abdominal discomfort and little bit of back pain which has been there for some time apparently. She does not have any acute change in this. No dysuria no urinary frequency not lightheaded no red rectal bleeding. She states she noticed a small amount of blood in her diaper this morning from her urine she believes. Patient was sent here by her family for further evaluation    Past Medical History  Diagnosis Date  . Chronic kidney disease   . Dysrhythmia   . Diabetes mellitus without complication (HCC)   . Hypertension   . Stroke (HCC)   . Arthritis     gout  . Dialysis patient (HCC)   . Pleural effusion   . Pulmonary hypertension (HCC)   . Renal insufficiency   . CHF (congestive heart failure) (HCC)   . Peripheral vascular disease (HCC)   . Shortness of breath dyspnea     with exertion  . GERD (gastroesophageal reflux disease)   . Anemia   . Restless leg syndrome   . Atrial fibrillation [facility])     Patient Active Problem List   Diagnosis Date Noted  . Leg pain, right 09/14/2015  . PVD (peripheral vascular disease) with claudication (HCC) 09/14/2015  . Pneumonia 08/13/2015  . Carotid stenosis 07/26/2015  . Amputation of fifth toe, left, traumatic (HCC) 06/24/2015  . Diabetes mellitus (HCC) 06/23/2015  . Diabetic neuropathy (HCC) 06/23/2015  . Chronic atrial fibrillation (HCC) 06/23/2015  . Left carotid artery stenosis  06/23/2015  . CVA (cerebral infarction) 06/21/2015  . Pressure ulcer 06/19/2015  . End stage renal disease (HCC) 06/18/2015  . Respiratory distress 12/11/2014    Past Surgical History  Procedure Laterality Date  . Cholecystectomy    . Peripheral vascular catheterization N/A 10/08/2014    Procedure: A/V Shuntogram/Fistulagram;  Surgeon: Annice Needy, MD;  Location: [facility] INVASIVE CV LAB;  Service: Cardiovascular;  Laterality: N/A;  . Joint replacement      bilateral hip  . Peripheral vascular catheterization N/A 03/04/2015    Procedure: A/V Shuntogram/Fistulagram;  Surgeon: Annice Needy, MD;  Location: [facility] INVASIVE CV LAB;  Service: Cardiovascular;  Laterality: N/A;  . Peripheral vascular catheterization N/A 03/04/2015    Procedure: A/V Shunt Intervention;  Surgeon: Annice Needy, MD;  Location: [facility] INVASIVE CV LAB;  Service: Cardiovascular;  Laterality: N/A;  . Fistulagram ([facility] hx)    . Parathyroidectomy    . Tonsillectomy    . Cardiac catheterization    . Eye surgery    . Cataract extraction, bilateral    . Amputation toe Left 06/07/2015    Procedure: AMPUTATION TOE;  Surgeon: Gwyneth Revels, DPM;  Location: [facility] ORS;  Service: Podiatry;  Laterality: Left;  Marland Kitchen Med[employer] bladder interstem      currently turned off for MRI in January  . Endarterectomy Left 07/26/2015    Procedure: ENDARTERECTOMY CAROTID;  Surgeon: Renford Dills, MD;  Location: [facility]  ORS;  Service: Vascular;  Laterality: Left;  . Thyroidectomy, partial    . Peripheral vascular catheterization N/A 09/16/2015    Procedure: Lower Extremity Angiography;  Surgeon: Annice Needy, MD;  Location: [facility] INVASIVE CV LAB;  Service: Cardiovascular;  Laterality: N/A;  . Peripheral vascular catheterization  09/16/2015    Procedure: Lower Extremity Intervention;  Surgeon: Annice Needy, MD;  Location: [facility] INVASIVE CV LAB;  Service: Cardiovascular;;    Current Outpatient Rx  Name  Route  Sig  Dispense  Refill  . acetaminophen (TYLENOL) 500 MG  tablet   Oral   Take 1,000 mg by mouth 2 (two) times daily.          Marland Kitchen albuterol (PROVENTIL HFA;VENTOLIN HFA) 108 (90 Base) MCG/ACT inhaler   Inhalation   Inhale 1-2 puffs into the lungs every 6 (six) hours as needed for wheezing or shortness of breath.         Marland Kitchen aspirin 81 MG chewable tablet   Oral   Chew 1 tablet (81 mg total) by mouth daily.   30 tablet   6   . Biotin 1 MG CAPS   Oral   Take 1 mg by mouth daily.          . bisacodyl (DULCOLAX) 5 MG EC tablet   Oral   Take 10 mg by mouth daily as needed for moderate constipation.          . calcium acetate, Phos Binder, (PHOSLYRA) 667 MG/5ML SOLN   Oral   Take 1,334 mg by mouth 3 (three) times daily with meals.         Marland Kitchen diltiazem (TIAZAC) 120 MG 24 hr capsule   Oral   Take 120 mg by mouth daily.         . diphenhydrAMINE (BENADRYL) 25 mg capsule   Oral   Take 1 capsule (25 mg total) by mouth every 6 (six) hours as needed (rash, itching).   30 capsule   0   . docusate sodium (COLACE) 100 MG capsule   Oral   Take 200 mg by mouth 2 (two) times daily as needed for mild constipation.         . folic acid-vitamin b complex-vitamin c-selenium-zinc (DIALYVITE) 3 MG TABS tablet   Oral   Take 1 tablet by mouth daily.         . furosemide (LASIX) 80 MG tablet   Oral   Take 80 mg by mouth 2 (two) times daily.         Marland Kitchen gabapentin (NEURONTIN) 100 MG capsule   Oral   Take 100 mg by mouth at bedtime. Pt takes on Sunday, Monday, Wednesday, and Friday.         . gabapentin (NEURONTIN) 300 MG capsule   Oral   Take 300 mg by mouth daily. Pt takes on Tuesday, Thursday, and Saturday after dialysis.         . Insulin Detemir (LEVEMIR) 100 UNIT/ML Pen   Subcutaneous   Inject 11 Units into the skin 2 (two) times daily. 0730, 1730         . lidocaine-prilocaine (EMLA) cream   Topical   Apply 1 application topically as needed (prior to accessing port).          . nystatin (MYCOSTATIN/NYSTOP) 100000  UNIT/GM POWD   Topical   Apply topically 2 (two) times daily as needed.         . Omega-3 Fatty Acids (FISH OIL CONCENTRATE) 300  MG CAPS   Oral   Take 1 capsule by mouth daily.         Marland Kitchen. PARoxetine (PAXIL) 40 MG tablet   Oral   Take 40 mg by mouth at bedtime.          . pravastatin (PRAVACHOL) 20 MG tablet   Oral   Take 1 tablet (20 mg total) by mouth daily.   30 tablet   5   . ranitidine (ZANTAC) 150 MG tablet   Oral   Take 150 mg by mouth 2 (two) times daily.          Marland Kitchen. senna-docusate (SENOKOT-S) 8.6-50 MG tablet   Oral   Take 1 tablet by mouth 2 (two) times daily as needed.          . traMADol (ULTRAM) 50 MG tablet   Oral   Take 1 tablet (50 mg total) by mouth every 6 (six) hours as needed for moderate pain.   30 tablet   0   . warfarin (COUMADIN) 3 MG tablet   Oral   Take 1.5 mg by mouth daily. Everyday except Mondays           Allergies Codeine; Contrast media; Oxycodone; and Quinine derivatives  Family History  Problem Relation Age of Onset  . Colon cancer Mother   . Stroke Father   . Diabetes Brother     Social History Social History  Substance Use Topics  . Smoking status: Never Smoker   . Smokeless tobacco: Never Used  . Alcohol Use: No    Review of Systems Constitutional: No fever/chills Eyes: No visual changes. ENT: No sore throat. No stiff neck no neck pain Cardiovascular: Denies chest pain. Respiratory: Denies shortness of breath. Gastrointestinal:   no vomiting.  No diarrhea.  No constipation. Genitourinary: Negative for dysuria. Musculoskeletal: Negative lower extremity swelling Skin: Negative for rash. Neurological: Negative for headaches, focal weakness or numbness. 10-point ROS otherwise negative.  ____________________________________________   PHYSICAL EXAM:  VITAL SIGNS: ED Triage Vitals  Enc Vitals Group     BP 10/12/15 0936 122/41 mmHg     Pulse Rate 10/12/15 0936 75     Resp 10/12/15 0936 18     Temp  10/12/15 0936 97.9 F (36.6 C)     Temp Source 10/12/15 0936 Oral     SpO2 10/12/15 0936 100 %     Weight 10/12/15 0936 160 lb (72.576 kg)     Height 10/12/15 0936 5\' 5"  (1.651 m)     Head Cir --      Peak Flow --      Pain Score 10/12/15 0937 5     Pain Loc --      Pain Edu? --      Excl. in GC? --     Constitutional: Alert and oriented. Well appearing and in no acute distress. Eyes: Conjunctivae are normal. PERRL. EOMI. Head: Atraumatic. Nose: No congestion/rhinnorhea. Mouth/Throat: Mucous membranes are moist.  Oropharynx non-erythematous. Neck: No stridor.   Nontender with no meningismus Cardiovascular: Normal rate, regular rhythm. Grossly normal heart sounds.  Good peripheral circulation. Respiratory: Normal respiratory effort.  No retractions. Lungs CTAB. Abdominal: Soft and nontender. No distention. No guarding no rebound Back:  There is no focal tenderness or step off there is no midline tenderness there are no lesions noted. there is no CVA tenderness Rectal exam: Brown stool faintly guaiac positive Musculoskeletal: No lower extremity tenderness. No joint effusions, no DVT signs both feet are warm and  well perfused. Neurologic:  Normal speech and language. No gross focal neurologic deficits are appreciated.  Skin: Chronic multiple ulcerations noted with no acute infection detected Psychiatric: Mood and affect are normal. Speech and behavior are normal.  ____________________________________________   LABS (all labs ordered are listed, but only abnormal results are displayed)  Labs Reviewed  PROTIME-INR - Abnormal; Notable for the following:    Prothrombin Time 44.3 (*)    INR 4.91 (*)    All other components within normal limits  CBC WITH DIFFERENTIAL/PLATELET - Abnormal; Notable for the following:    RBC 2.39 (*)    Hemoglobin 7.2 (*)    HCT 21.8 (*)    RDW 17.5 (*)    Lymphs Abs 0.5 (*)    All other components within normal limits  COMPREHENSIVE METABOLIC PANEL  - Abnormal; Notable for the following:    Sodium 133 (*)    Chloride 95 (*)    Glucose, Bld 166 (*)    BUN 96 (*)    Creatinine, Ser 4.99 (*)    Calcium 7.9 (*)    Albumin 2.7 (*)    ALT 11 (*)    GFR calc non Af Amer 7 (*)    GFR calc Af Amer 9 (*)    Anion gap 16 (*)    All other components within normal limits  APTT - Abnormal; Notable for the following:    aPTT 83 (*)    All other components within normal limits  TYPE AND SCREEN   ____________________________________________  EKG  I personally interpreted any EKGs ordered by me or triage  ____________________________________________  RADIOLOGY  I reviewed any imaging ordered by me or triage that were performed during my shift and, if possible, patient and/or family made aware of any abnormal findings. ____________________________________________   PROCEDURES  Procedure(s) performed: None  Critical Care performed: None  ____________________________________________   INITIAL IMPRESSION / ASSESSMENT AND PLAN / ED COURSE  Pertinent labs & imaging results that were available during my care of the patient were reviewed by me and considered in my medical decision making (see chart for details).  Patient with INR 4.4, she did have recent surgery on her leg had an elevated INR. That is trending down as she has been holding it at home. She has what appears to be a small iliac muscle hematoma but no other evidence of significant active bleeding. She has family by positive from below. Her nephrologist Dr. Thedore Mins was kind enough to come to see the patient, he states that they will transfuse her during dialysis today which they will arrange. The did ask for admission which is reasonable. They prefer not to reverse her anticoagulation at this time given her significant history of clotting, and as she appears stable and vital signs and do not think this is unreasonable. We will admit to the hospitalist and they agree with plan thus  far. ____________________________________________   FINAL CLINICAL IMPRESSION(S) / ED DIAGNOSES  Final diagnoses:  None      This chart was dictated using voice recognition software.  Despite best efforts to proofread,  errors can occur which can change meaning.     Jeanmarie Plant, MD 10/12/15 1557  Jeanmarie Plant, MD 10/12/15 [phone]

## 2015-10-12 NOTE — Progress Notes (Signed)
HD STARTED  

## 2015-10-13 LAB — CBC
HCT: 20.9 % — ABNORMAL LOW (ref 35.0–47.0)
Hemoglobin: 6.9 g/dL — ABNORMAL LOW (ref 12.0–16.0)
MCH: 30.5 pg (ref 26.0–34.0)
MCHC: 32.9 g/dL (ref 32.0–36.0)
MCV: 92.6 fL (ref 80.0–100.0)
PLATELETS: 301 10*3/uL (ref 150–440)
RBC: 2.25 MIL/uL — AB (ref 3.80–5.20)
RDW: 17.4 % — ABNORMAL HIGH (ref 11.5–14.5)
WBC: 3.7 10*3/uL (ref 3.6–11.0)

## 2015-10-13 LAB — HEMOGLOBIN
HEMOGLOBIN: 6.7 g/dL — AB (ref 12.0–16.0)
HEMOGLOBIN: 6.8 g/dL — AB (ref 12.0–16.0)
HEMOGLOBIN: 6.8 g/dL — AB (ref 12.0–16.0)

## 2015-10-13 LAB — BASIC METABOLIC PANEL
Anion gap: 10 (ref 5–15)
BUN: 44 mg/dL — ABNORMAL HIGH (ref 6–20)
CALCIUM: 8 mg/dL — AB (ref 8.9–10.3)
CO2: 29 mmol/L (ref 22–32)
CREATININE: 2.68 mg/dL — AB (ref 0.44–1.00)
Chloride: 98 mmol/L — ABNORMAL LOW (ref 101–111)
GFR, EST AFRICAN AMERICAN: 18 mL/min — AB (ref >60)
GFR, EST NON AFRICAN AMERICAN: 16 mL/min — AB (ref >60)
Glucose, Bld: 112 mg/dL — ABNORMAL HIGH (ref 65–99)
Potassium: 3.4 mmol/L — ABNORMAL LOW (ref 3.5–5.1)
SODIUM: 137 mmol/L (ref 135–145)

## 2015-10-13 LAB — GLUCOSE, CAPILLARY
GLUCOSE-CAPILLARY: 88 mg/dL (ref 65–99)
GLUCOSE-CAPILLARY: 147 mg/dL — AB (ref 65–99)
GLUCOSE-CAPILLARY: 97 mg/dL (ref 65–99)
GLUCOSE-CAPILLARY: 158 mg/dL — AB (ref 65–99)

## 2015-10-13 LAB — PROTIME-INR
INR: 4.07
Prothrombin Time: 38.4 seconds — ABNORMAL HIGH (ref 11.4–15.0)

## 2015-10-13 LAB — MRSA PCR SCREENING: MRSA BY PCR: NEGATIVE

## 2015-10-13 MED ORDER — ACETAMINOPHEN 325 MG PO TABS
325.0000 mg | ORAL_TABLET | Freq: Four times a day (QID) | ORAL | Status: DC | PRN
  Administered 2015-10-13: 650 mg via ORAL
  Filled 2015-10-13: qty 2

## 2015-10-13 MED ORDER — VITAMIN K1 10 MG/ML IJ SOLN
1.0000 mg | Freq: Once | INTRAVENOUS | Status: AC
  Administered 2015-10-13: 1 mg via INTRAVENOUS
  Filled 2015-10-13: qty 0.1

## 2015-10-13 MED ORDER — COLLAGENASE 250 UNIT/GM EX OINT
TOPICAL_OINTMENT | Freq: Every day | CUTANEOUS | Status: DC
  Administered 2015-10-13 – 2015-10-15 (×3): via TOPICAL
  Filled 2015-10-13: qty 30

## 2015-10-13 MED ORDER — VITAMIN K1 1 MG/0.5ML IJ SOLN: 1.0000 mg | Freq: Once | INTRAMUSCULAR | Status: DC

## 2015-10-13 NOTE — Progress Notes (Signed)
Dr. Luberta MutterKonidena paged multiple times throughout 3 hour span for patient request for tylenol and critical INR of 4.06. Waiting on MD to call back.

## 2015-10-13 NOTE — Progress Notes (Signed)
Called Dr. Anne HahnWillis regarding patient's hemoglobin level of 6.9.  Doctor wants to wait for new lab results.  Arturo MortonClay, [place] N 10/13/2015  12:06 AM

## 2015-10-13 NOTE — Consult Note (Signed)
[facility] Vascular Consult Note  MRN : [id number]  [name] is a 82 y.o. (15-Oct-1933) female who presents with chief complaint of  Chief Complaint  Patient presents with  . Abdominal Pain   History of Present Illness:  Patient well known to our service. Patient endorses a history of her daughter noticing blood in her urine and diaper yesterday. This prompted her daughter to take her mother to [facility] for further evaluation. In the ED, the patient was found to have an INR of 4.91 and an asymmetric prominence of the right iliacus muscle right posterior pelvic sidewall just anterior to iliac bone. Measures at least 4.2 x 6.1 cm. Vague high-density material is noted centrally. Findings are highly suspicious for intramuscular bleed / hematoma. Patient was on Eliquis at one point, but states she was switched to coumadin because of her "kidney's". On coumadin for A-fib. She is a dialysis patient. No bleeding per rectum as per patient. The patient denied experiencing any chest pain, SOD of dizziness. She denies any abdominal or right lower extremity pain.   Consulted by primary team, Dr. Luberta Mutter for evaluation of hematoma.  Current Facility-Administered Medications  Medication Dose Route Frequency Provider Last Rate Last Dose  . acetaminophen (TYLENOL) tablet 325-650 mg  325-650 mg Oral Q6H PRN Katha Hamming, MD   650 mg at 10/13/15 1221  . albuterol (PROVENTIL) (2.5 MG/3ML) 0.083% nebulizer solution 2.5 mg  2.5 mg Nebulization Q6H PRN Altamese Dilling, MD      . bisacodyl (DULCOLAX) EC tablet 10 mg  10 mg Oral Daily PRN Altamese Dilling, MD      . calcium acetate (Phos Binder) (PHOSLYRA) 667 MG/5ML oral solution 1,334 mg  1,334 mg Oral TID WC Altamese Dilling, MD   1,334 mg at 10/13/15 1221  . diltiazem (TIAZAC) 24 hr capsule 120 mg  120 mg Oral Daily Altamese Dilling, MD   120 mg at 10/13/15 1043  . diphenhydrAMINE (BENADRYL) capsule 25 mg  25  mg Oral Q6H PRN Altamese Dilling, MD      . docusate sodium (COLACE) capsule 200 mg  200 mg Oral BID PRN Altamese Dilling, MD      . famotidine (PEPCID) tablet 10 mg  10 mg Oral BID Altamese Dilling, MD   10 mg at 10/13/15 1043  . furosemide (LASIX) tablet 80 mg  80 mg Oral BID Altamese Dilling, MD   80 mg at 10/13/15 0812  . gabapentin (NEURONTIN) capsule 100 mg  100 mg Oral Once per day on Sun Mon Wed Fri Altamese Dilling, MD      . gabapentin (NEURONTIN) capsule 300 mg  300 mg Oral Once per day on Tue Thu Sat Altamese Dilling, MD   300 mg at 10/13/15 0017  . insulin aspart (novoLOG) injection 0-9 Units  0-9 Units Subcutaneous TID WC Altamese Dilling, MD   1 Units at 10/13/15 1221  . insulin detemir (LEVEMIR) injection 11 Units  11 Units Subcutaneous BID Altamese Dilling, MD   11 Units at 10/13/15 1043  . lidocaine-prilocaine (EMLA) cream 1 application  1 application Topical PRN Altamese Dilling, MD      . multivitamin (RENA-VIT) tablet 1 tablet  1 tablet Oral Daily Altamese Dilling, MD   1 tablet at 10/13/15 1044  . nystatin (MYCOSTATIN) powder   Topical BID Altamese Dilling, MD      . omega-3 acid ethyl esters (LOVAZA) capsule 1 g  1 g Oral BID Altamese Dilling, MD   1 g at  10/13/15 1044  . PARoxetine (PAXIL) tablet 40 mg  40 mg Oral QHS Altamese Dilling, MD   40 mg at 10/13/15 0017  . phytonadione (VITAMIN K) 1 mg in dextrose 5 % 50 mL IVPB  1 mg Intravenous Once Katha Hamming, MD      . pravastatin (PRAVACHOL) tablet 20 mg  20 mg Oral Daily Altamese Dilling, MD   20 mg at 10/13/15 1043  . senna-docusate (Senokot-S) tablet 1 tablet  1 tablet Oral BID Altamese Dilling, MD   1 tablet at 10/13/15 1043  . traMADol (ULTRAM) tablet 50-100 mg  50-100 mg Oral Q12H PRN Altamese Dilling, MD   100 mg at 10/12/15 2209   Past Medical History  Diagnosis Date  . Chronic kidney disease   . Dysrhythmia   . Diabetes  mellitus without complication (HCC)   . Hypertension   . Stroke (HCC)   . Arthritis     gout  . Dialysis patient (HCC)   . Pleural effusion   . Pulmonary hypertension (HCC)   . Renal insufficiency   . CHF (congestive heart failure) (HCC)   . Peripheral vascular disease (HCC)   . Shortness of breath dyspnea     with exertion  . GERD (gastroesophageal reflux disease)   . Anemia   . Restless leg syndrome   . Atrial fibrillation [facility])    Past Surgical History  Procedure Laterality Date  . Cholecystectomy    . Peripheral vascular catheterization N/A 10/08/2014    Procedure: A/V Shuntogram/Fistulagram;  Surgeon: Annice Needy, MD;  Location: [facility] INVASIVE CV LAB;  Service: Cardiovascular;  Laterality: N/A;  . Joint replacement      bilateral hip  . Peripheral vascular catheterization N/A 03/04/2015    Procedure: A/V Shuntogram/Fistulagram;  Surgeon: Annice Needy, MD;  Location: [facility] INVASIVE CV LAB;  Service: Cardiovascular;  Laterality: N/A;  . Peripheral vascular catheterization N/A 03/04/2015    Procedure: A/V Shunt Intervention;  Surgeon: Annice Needy, MD;  Location: [facility] INVASIVE CV LAB;  Service: Cardiovascular;  Laterality: N/A;  . Fistulagram ([facility] hx)    . Parathyroidectomy    . Tonsillectomy    . Cardiac catheterization    . Eye surgery    . Cataract extraction, bilateral    . Amputation toe Left 06/07/2015    Procedure: AMPUTATION TOE;  Surgeon: Gwyneth Revels, DPM;  Location: [facility] ORS;  Service: Podiatry;  Laterality: Left;  Marland Kitchen [employer] bladder interstem      currently turned off for MRI in January  . Endarterectomy Left 07/26/2015    Procedure: ENDARTERECTOMY CAROTID;  Surgeon: Renford Dills, MD;  Location: [facility] ORS;  Service: Vascular;  Laterality: Left;  . Thyroidectomy, partial    . Peripheral vascular catheterization N/A 09/16/2015    Procedure: Lower Extremity Angiography;  Surgeon: Annice Needy, MD;  Location: [facility] INVASIVE CV LAB;  Service: Cardiovascular;  Laterality:  N/A;  . Peripheral vascular catheterization  09/16/2015    Procedure: Lower Extremity Intervention;  Surgeon: Annice Needy, MD;  Location: [facility] INVASIVE CV LAB;  Service: Cardiovascular;;   Social History Social History  Substance Use Topics  . Smoking status: Never Smoker   . Smokeless tobacco: Never Used  . Alcohol Use: No   Family History Family History  Problem Relation Age of Onset  . Colon cancer Mother   . Stroke Father   . Diabetes Brother   Patient denies any history clotting / bleeding disorders, renal disease or PAD disease.  Allergies  Allergen Reactions  . Codeine Other (See Comments)    Reaction:  Confusion/Hallucinations   . Contrast Media [Iodinated Diagnostic Agents] Other (See Comments)    "skin peel"  . Oxycodone Other (See Comments)    Reaction:  Confusion/Hallucinations   . Quinine Derivatives Rash   REVIEW OF SYSTEMS (Negative unless checked)  Constitutional: [] Weight loss  [] Fever  [] Chills Cardiac: [] Chest pain   [] Chest pressure   [] Palpitations   [] Shortness of breath when laying flat   [] Shortness of breath at rest   [] Shortness of breath with exertion. Vascular:  [] Pain in legs with walking   [] Pain in legs at rest   [] Pain in legs when laying flat   [] Claudication   [] Pain in feet when walking  [] Pain in feet at rest  [] Pain in feet when laying flat   [] History of DVT   [] Phlebitis   [] Swelling in legs   [] Varicose veins   [x] Non-healing ulcers Pulmonary:   [] Uses home oxygen   [] Productive cough   [] Hemoptysis   [] Wheeze  [] COPD   [] Asthma Neurologic:  [] Dizziness  [] Blackouts   [] Seizures   [] History of stroke   [] History of TIA  [] Aphasia   [] Temporary blindness   [] Dysphagia   [] Weakness or numbness in arms   [] Weakness or numbness in legs Musculoskeletal:  [] Arthritis   [] Joint swelling   [] Joint pain   [] Low back pain Hematologic:  [] Easy bruising  [] Easy bleeding   [] Hypercoagulable state   [x] Anemic  [] Hepatitis Gastrointestinal:  [] Blood in  stool   [] Vomiting blood  [] Gastroesophageal reflux/heartburn   [] Difficulty swallowing. Genitourinary:  [x] Chronic kidney disease   [] Difficult urination  [] Frequent urination  [] Burning with urination   [x] Blood in urine Skin:  [] Rashes   [x] Ulcers   [] Wounds Psychological:  [] History of anxiety   []  History of major depression.  Physical Examination  Filed Vitals:   10/12/15 2035 10/12/15 2300 10/12/15 2353 10/13/15 0501  BP:   139/51 116/67  Pulse:   115 110  Temp: 98 F (36.7 C) 97.8 F (36.6 C) 97.7 F (36.5 C) 98.2 F (36.8 C)  TempSrc: Oral Oral Oral Oral  Resp:   18 18  Height:      Weight: 73.7 kg (162 lb 7.7 oz) 71.6 kg (157 lb 13.6 oz) 71.079 kg (156 lb 11.2 oz)   SpO2: 95%  97% 92%   Body mass index is 26.08 kg/(m^2).   Gen:  WD/WN, NAD Head: [place]/AT, No temporalis wasting. Prominent temp pulse not noted. Ear/Nose/Throat: Hearing grossly intact, nares w/o erythema or drainage, oropharynx w/o Erythema/Exudate Eyes: PERRLA, EOMI.  Neck: Supple, no nuchal rigidity.  No bruit or JVD.  Pulmonary:  Good air movement, clear to auscultation bilaterally.  Cardiac: Regular, no Murmurs, rubs or gallops. Vascular:  Vessel Right Left  Radial Palpable Palpable  Ulnar Palpable Palpable  Brachial Palpable Palpable  Carotid Palpable, without bruit Palpable, without bruit  Aorta Not palpable N/A  Femoral Palpable Palpable  Popliteal Palpable Palpable  PT Palpable Palpable  DP Palpable Palpable   Right Lower Extremity: No ecchymosis noted, thigh soft, calf soft, no swelling, no acute vascular compromise to extremity. Ulceration to fourth toe.   Gastrointestinal: No ecchymosis noted. Two areas with wound dressings noted. soft, non-tender/non-distended. No guarding/reflex. No masses. Musculoskeletal: M/S 5/5 throughout.  Extremities without ischemic changes.  No deformity or atrophy. No edema. Neurologic: CN 2-12 intact. Pain and light touch intact in extremities.  Symmetrical.   Speech is fluent. Motor exam as listed  above. Psychiatric: Judgment intact, Mood & affect appropriate for pt's clinical situation. Dermatologic: No rashes. Lymph : No Cervical, Axillary, or Inguinal lymphadenopathy.  CBC Lab Results  Component Value Date   WBC 3.7 10/13/2015   HGB 6.7* 10/13/2015   HCT 20.9* 10/13/2015   MCV 92.6 10/13/2015   PLT 301 10/13/2015   BMET    Component Value Date/Time   NA 137 10/13/2015 0036   NA 137 07/04/2014 1303   K 3.4* 10/13/2015 0036   K 3.3* 07/04/2014 1303   CL 98* 10/13/2015 0036   CL 100 07/04/2014 1303   CO2 29 10/13/2015 0036   CO2 29 07/04/2014 1303   GLUCOSE 112* 10/13/2015 0036   GLUCOSE 76 07/04/2014 1303   BUN 44* 10/13/2015 0036   BUN 44* 07/04/2014 1303   CREATININE 2.68* 10/13/2015 0036   CREATININE 2.72* 07/04/2014 1303   CALCIUM 8.0* 10/13/2015 0036   CALCIUM 8.9 07/04/2014 1303   GFRNONAA 16* 10/13/2015 0036   GFRNONAA 18* 07/04/2014 1303   GFRNONAA 29* 12/02/2013 0847   GFRAA 18* 10/13/2015 0036   GFRAA 22* 07/04/2014 1303   GFRAA 34* 12/02/2013 0847   Estimated Creatinine Clearance: 16.8 mL/min (by C-G formula based on Cr of 2.68).  COAG Lab Results  Component Value Date   INR 4.07* 10/13/2015   INR 4.91* 10/12/2015   INR 2.30 09/18/2015   Radiology Ct Abdomen Pelvis Wo Contrast  10/12/2015  CLINICAL DATA:  Lower abdominal pain, back pain for 2 days, elevated INR EXAM: CT ABDOMEN AND PELVIS WITHOUT CONTRAST TECHNIQUE: Multidetector CT imaging of the abdomen and pelvis was performed following the standard protocol without IV contrast. COMPARISON:  09/14/2015 FINDINGS: Cardiomegaly is noted.  Mitral valve calcifications. Stable 8 mm nodule right base anteriorly. There is small left pleural effusion. Mild atelectasis bilateral lower lobe posteriorly. Study is limited without IV contrast. No intrahepatic biliary ductal dilatation. The unenhanced pancreas is unremarkable. Unenhanced spleen is unremarkable.  Unenhanced kidneys shows bilateral cortical thinning. No adrenal gland mass is noted. There is nonobstructive calcified calculus in upper pole of the right kidney measures 1.4 cm. No hydronephrosis or hydroureter. No calcified ureteral calculi. The urinary bladder is obscured by extensive metallic artifacts from bilateral hip prosthesis. Oral contrast material was given to the patient. No small bowel obstruction. No gastric outlet obstruction. Moderate stool noted within cecum. No pericecal inflammation. The terminal ileum is unremarkable. Some colonic stool noted within transverse colon and descending colon. No distal colonic obstruction. Moderate stool noted within rectum. The rectum measures Extensive atherosclerotic calcifications of abdominal aorta, SMA, celiac trunk, splenic artery, bilateral common iliac arteries. No aortic aneurysm. There is no retroperitoneal adenopathy. No mesenteric adenopathy. There is thickening of the right iliacus muscle just anterior to iliac bone. This is a new finding. Subtle hiatal density material is noted centrally within iliacus muscle. Iliacus muscle measures at least 4.1 by 6 cm. Findings are highly suspicious for intramuscular bleed/hematoma. Study is limited without IV contrast. No destructive bony lesions are noted. Sagittal images of the spine shows extensive degenerative changes thoracolumbar spine. Significant disc space flattening with endplate sclerotic changes vacuum disc phenomenon anterior spurring at L2-L3 level. There is diffuse osteopenia. Degenerative changes bilateral SI joints. Extensive metallic artifacts from bilateral hip prosthesis. Mild anasarca infiltration of subcutaneous fat lateral pelvic wall. IMPRESSION: 1. There is asymmetric prominence of the right iliacus muscle right posterior pelvic sidewall just anterior to iliac bone. Measures at least 4.2 x 6.1 cm. Vague high-density material is noted  centrally. Findings are highly suspicious for  intramuscular bleed/ hematoma. No free fluid each in noted within pelvis. The study is limited without IV contrast. In coronal image 84 the intramuscular hematoma measures 7.8 cm cranial caudally by 5.6 cm. 2. Extensive metallic artifacts limits evaluation of the lower pelvis. Bilateral hip prosthesis. Degenerative changes thoracolumbar spine. 3. Stable 8 mm nodule in right base anteriorly. Bilateral small pleural effusion. Bilateral lower lobe posterior atelectasis. 4. Again noted bilateral renal cortical thinning. Right nonobstructive nephrolithiasis. No hydronephrosis or hydroureter. 5. No small bowel obstruction. Moderate stool noted in right colon. There is moderate distension of the rectum with stool up to 6.2 cm suspicious for mild fecal impaction. These results were called by telephone at the time of interpretation on 10/12/2015 at 3:47 pm to Dr. Ileana RoupJAMES MCSHANE , who verbally acknowledged these results. Electronically Signed   By: Natasha MeadLiviu  Pop M.D.   On: 10/12/2015 15:48   Ct Angio Ao+bifem W/cm &/or Wo/cm  09/14/2015  CLINICAL DATA:  Right leg pain. EXAM: CT ANGIOGRAPHY AOBIFEM WITHOUT AND WITH CONTRAST TECHNIQUE: CT angiography of the aorta and lower extremity arteries was performed with reformats generated. CONTRAST:  125 cc Isovue 370 intravenous COMPARISON:  Chest CT 12/12/2014 FINDINGS: Replaced right hepatic artery to the SMA. Accessory right renal artery. Otherwise, aortic branching is standard. There is diffuse atherosclerotic calcification of the aorta and visceral branches. No major branch occlusion is seen. Negative for aneurysm. No inflow disease to explain leg symptoms. Right leg: Atheromatous but widely patent right common femoral and superficial femoral arteries. Occluded popliteal artery at the level of the knee with well-developed collaterals providing prompt and symmetric reconstituted collateral flow. On reformats, this appears to be an eccentric luminal lesion with defined margins and  internal calcification, possible remote embolus. Symmetrically timed runoff that is 3 vessel at the ankle. Left leg: Diffusely atheromatous but widely patent left common femoral and superficial femoral arteries. The left profunda femoris is widely patent. Mild to moderate popliteal narrowing on the left. Three-vessel runoff at the ankle. Lower chest and abdominal wall: Cardiomegaly. Septal thickening at the bases with trace effusions, loculated complex appearing on the left with subpleural scarring or atelectasis. This is likely developing round atelectasis given 2016 chest CT appearance 8 mm nodule over the right diaphragm is stable since comparison CT. Hepatobiliary: No focal liver abnormality.No evidence of biliary obstruction or stone. Pancreas: Unremarkable. Spleen: Sub cm low-density in the medial spleen is nonspecific. Adrenals/Urinary Tract: Negative adrenals. Marked lack of enhancement of the kidneys in this patient with end-stage renal disease. 14 mm right upper pole calculus. No hydronephrosis or ureteral calculus. Bladder is obscured by streak artifact from hip prostheses. Reproductive:Negative Stomach/Bowel:  No obstruction. No appendicitis. Vascular/Lymphatic: No acute vascular abnormality. No mass or adenopathy. Peritoneal: No ascites or pneumoperitoneum. Musculoskeletal: L2-3 endplate erosions, sclerosis, and surrounding paraspinal inflammation. No indication of epidural collection or high-grade stenosis. There is mineralization at the level of the disc. Marked osteopenia.  Baker cyst on the left. Review of the MIP images confirms the above findings. IMPRESSION: 1. Occluded or critically stenotic right popliteal artery, chronic appearing given the degree of collateral formation with symmetric three-vessel runoff. The stenotic lesion has an eccentric luminal based appearance and may have been embolic. 2. Negative for inflow disease. 3. L2-3 discitis versus dialysis related spondyloarthropathy,  associated disc calcification favoring the latter. 4. Possible mild pulmonary edema. 5. Chronic small left pleural effusion and scarring. 6. 8 mm right lower lobe pulmonary nodule but is  stable from 12/12/2014. 1 year follow-up CT could document stability (assuming patient is a treatment candidate). Electronically Signed   By: Marnee Spring M.D.   On: 09/14/2015 20:36   US Venous Img Lower Unilateral Right  09/14/2015  CLINICAL DATA:  82 year old female with a history of leg pain EXAM: RIGHT LOWER EXTREMITY VENOUS DOPPLER ULTRASOUND TECHNIQUE: Gray-scale sonography with graded compression, as well as color Doppler and duplex ultrasound were performed to evaluate the lower extremity deep venous systems from the level of the common femoral vein and including the common femoral, femoral, profunda femoral, popliteal and calf veins including the posterior tibial, peroneal and gastrocnemius veins when visible. The superficial great saphenous vein was also interrogated. Spectral Doppler was utilized to evaluate flow at rest and with distal augmentation maneuvers in the common femoral, femoral and popliteal veins. COMPARISON:  None. FINDINGS: Contralateral Common Femoral Vein: Respiratory phasicity is normal and symmetric with the symptomatic side. No evidence of thrombus. Normal compressibility. Common Femoral Vein: No evidence of thrombus. Normal compressibility, respiratory phasicity and response to augmentation. Saphenofemoral Junction: No evidence of thrombus. Normal compressibility and flow on color Doppler imaging. Profunda Femoral Vein: No evidence of thrombus. Normal compressibility and flow on color Doppler imaging. Femoral Vein: No evidence of thrombus. Normal compressibility, respiratory phasicity and response to augmentation. Popliteal Vein: No evidence of thrombus. Normal compressibility, respiratory phasicity and response to augmentation. Calf Veins: No evidence of thrombus. Normal compressibility and  flow on color Doppler imaging. Superficial Great Saphenous Vein: No evidence of thrombus. Normal compressibility and flow on color Doppler imaging. Other Findings:  None. IMPRESSION: Sonographic survey of the right lower extremity negative for DVT. Signed, Yvone Neu. Loreta Ave, DO [facility] Radiology Electronically Signed   By: Gilmer Mor D.O.   On: 09/14/2015 15:07   Assessment/Plan 82 year old female who presented with supra-theraputic INR on coumadin with spontaneous hematoma.  1) No surgical or endovascular intervention indicated at this time. No active bleeding. No vascular compromise. 2) Medication / blood administration as per primary team management. 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 Wyn Quaker - just recently seen in office, followed with ABI's and duplex. Has had interventions in the past - no indication for intervention again at this time.  4) Continue local wound care to right lower extremity - being followed by Dr. Ether Griffins 5) Discussed with Dr. Weldon Inches, PA-C  10/13/2015 1:36 PM

## 2015-10-13 NOTE — Progress Notes (Signed)
Subjective:   In ER, patient was found to have low Hgb of 7.2. INR 4.91 CT abdomen is highly suspicious for intramuscular bleed/ hematoma She is admitted for further evaluation  Doing well today Tolerated dialysis yesterday   Objective:  Vital signs in last 24 hours:  Temp:  [97.7 F (36.5 C)-98.4 F (36.9 C)] 98.2 F (36.8 C) (05/14 0501) Pulse Rate:  [69-115] 110 (05/14 0501) Resp:  [16-26] 18 (05/14 0501) BP: (98-139)/(48-71) 116/67 mmHg (05/14 0501) SpO2:  [91 %-100 %] 92 % (05/14 0501) Weight:  [71.079 kg (156 lb 11.2 oz)-74.889 kg (165 lb 1.6 oz)] 71.079 kg (156 lb 11.2 oz) (05/13 2353)  Weight change:  Filed Weights   10/12/15 2035 10/12/15 2300 10/12/15 2353  Weight: 73.7 kg (162 lb 7.7 oz) 71.6 kg (157 lb 13.6 oz) 71.079 kg (156 lb 11.2 oz)    Intake/Output:    Intake/Output Summary (Last 24 hours) at 10/13/15 1145 Last data filed at 10/13/15 0953  Gross per 24 hour  Intake    480 ml  Output    502 ml  Net    -22 ml     Physical Exam: General: NAD, Laying in bed  HEENT Anicteric, moist oral mucus membranes  Neck supple  Pulm/lungs Clear b/l  CVS/Heart Irregular, no rub or gallop  Abdomen:  Soft, NT  Extremities: No peripheral edema  Neurologic: Alert, oriented  Skin: No acute rashes  Access: AVF       Basic Metabolic Panel:   Recent Labs Lab 10/12/15 1029 10/13/15 0036  NA 133* 137  K 3.9 3.4*  CL 95* 98*  CO2 22 29  GLUCOSE 166* 112*  BUN 96* 44*  CREATININE 4.99* 2.68*  CALCIUM 7.9* 8.0*  PHOS 6.5*  --      CBC:  Recent Labs Lab 10/12/15 1029 10/12/15 1815 10/13/15 0036 10/13/15 0915  WBC 4.5  --  3.7  --   NEUTROABS 3.0  --   --   --   HGB 7.2* 6.9* 6.9*  6.8* 6.7*  HCT 21.8*  --  20.9*  --   MCV 91.2  --  92.6  --   PLT 299  --  301  --       Microbiology:  Recent Results (from the past 720 hour(s))  MRSA PCR Screening     Status: None   Collection Time: 10/13/15 12:33 AM  Result Value Ref Range Status    MRSA by PCR NEGATIVE NEGATIVE Final    Comment:        The GeneXpert MRSA Assay (FDA approved for NASAL specimens only), is one component of a comprehensive MRSA colonization surveillance program. It is not intended to diagnose MRSA infection nor to guide or monitor treatment for MRSA infections.     Coagulation Studies:  Recent Labs  10/12/15 1029 10/13/15 0915  LABPROT 44.3* 38.4*  INR 4.91* 4.07*    Urinalysis: No results for input(s): COLORURINE, LABSPEC, PHURINE, GLUCOSEU, HGBUR, BILIRUBINUR, KETONESUR, PROTEINUR, UROBILINOGEN, NITRITE, LEUKOCYTESUR in the last 72 hours.  Invalid input(s): APPERANCEUR    Imaging: Ct Abdomen Pelvis Wo Contrast  10/12/2015  CLINICAL DATA:  Lower abdominal pain, back pain for 2 days, elevated INR EXAM: CT ABDOMEN AND PELVIS WITHOUT CONTRAST TECHNIQUE: Multidetector CT imaging of the abdomen and pelvis was performed following the standard protocol without IV contrast. COMPARISON:  09/14/2015 FINDINGS: Cardiomegaly is noted.  Mitral valve calcifications. Stable 8 mm nodule right base anteriorly. There is small left pleural effusion. Mild  atelectasis bilateral lower lobe posteriorly. Study is limited without IV contrast. No intrahepatic biliary ductal dilatation. The unenhanced pancreas is unremarkable. Unenhanced spleen is unremarkable. Unenhanced kidneys shows bilateral cortical thinning. No adrenal gland mass is noted. There is nonobstructive calcified calculus in upper pole of the right kidney measures 1.4 cm. No hydronephrosis or hydroureter. No calcified ureteral calculi. The urinary bladder is obscured by extensive metallic artifacts from bilateral hip prosthesis. Oral contrast material was given to the patient. No small bowel obstruction. No gastric outlet obstruction. Moderate stool noted within cecum. No pericecal inflammation. The terminal ileum is unremarkable. Some colonic stool noted within transverse colon and descending colon. No distal  colonic obstruction. Moderate stool noted within rectum. The rectum measures Extensive atherosclerotic calcifications of abdominal aorta, SMA, celiac trunk, splenic artery, bilateral common iliac arteries. No aortic aneurysm. There is no retroperitoneal adenopathy. No mesenteric adenopathy. There is thickening of the right iliacus muscle just anterior to iliac bone. This is a new finding. Subtle hiatal density material is noted centrally within iliacus muscle. Iliacus muscle measures at least 4.1 by 6 cm. Findings are highly suspicious for intramuscular bleed/hematoma. Study is limited without IV contrast. No destructive bony lesions are noted. Sagittal images of the spine shows extensive degenerative changes thoracolumbar spine. Significant disc space flattening with endplate sclerotic changes vacuum disc phenomenon anterior spurring at L2-L3 level. There is diffuse osteopenia. Degenerative changes bilateral SI joints. Extensive metallic artifacts from bilateral hip prosthesis. Mild anasarca infiltration of subcutaneous fat lateral pelvic wall. IMPRESSION: 1. There is asymmetric prominence of the right iliacus muscle right posterior pelvic sidewall just anterior to iliac bone. Measures at least 4.2 x 6.1 cm. Vague high-density material is noted centrally. Findings are highly suspicious for intramuscular bleed/ hematoma. No free fluid each in noted within pelvis. The study is limited without IV contrast. In coronal image 84 the intramuscular hematoma measures 7.8 cm cranial caudally by 5.6 cm. 2. Extensive metallic artifacts limits evaluation of the lower pelvis. Bilateral hip prosthesis. Degenerative changes thoracolumbar spine. 3. Stable 8 mm nodule in right base anteriorly. Bilateral small pleural effusion. Bilateral lower lobe posterior atelectasis. 4. Again noted bilateral renal cortical thinning. Right nonobstructive nephrolithiasis. No hydronephrosis or hydroureter. 5. No small bowel obstruction. Moderate  stool noted in right colon. There is moderate distension of the rectum with stool up to 6.2 cm suspicious for mild fecal impaction. These results were called by telephone at the time of interpretation on 10/12/2015 at 3:47 pm to Dr. Ileana Roup , who verbally acknowledged these results. Electronically Signed   By: Natasha Mead M.D.   On: 10/12/2015 15:48     Medications:     . calcium acetate (Phos Binder)  1,334 mg Oral TID WC  . diltiazem  120 mg Oral Daily  . famotidine  10 mg Oral BID  . furosemide  80 mg Oral BID  . gabapentin  100 mg Oral Once per day on Sun Mon Wed Fri  . gabapentin  300 mg Oral Once per day on Tue Thu Sat  . insulin aspart  0-9 Units Subcutaneous TID WC  . insulin detemir  11 Units Subcutaneous BID  . multivitamin  1 tablet Oral Daily  . nystatin   Topical BID  . omega-3 acid ethyl esters  1 g Oral BID  . PARoxetine  40 mg Oral QHS  . pravastatin  20 mg Oral Daily  . senna-docusate  1 tablet Oral BID     Assessment/ Plan:  82 y.o.  female with past medical history of atrial fibrillation on Coumadin, hypertension, gout, insulin-dependent diabetes mellitus type 2, diabetic neuropathy, CVA with right visual field blindness, hyperlipidemia, major depressive disorder, overactive bladder with incontinence, generalized anxiety disorder, congestive heart failure diastolic, parathyroidectomy, and osteoarthritis, s/p left CEA 07/26/15. Patient has never smoked.  [facility]   1. ESRD on HD TTHS:  Next HD Tuesday  2. Anemia chronic kidney disease and blood loss.  - high INR - blood transfusion planned today - EPO with HD - Intra muscular hematoma  3. Secondary hyperparathyroidism.  - continue calcium acetate          LOS: 1 Christyne Mccain 5/14/201711:45 AM

## 2015-10-13 NOTE — Progress Notes (Signed)
Called Dr. Tobi BastosPyreddy regarding patient's hemoglobin level of 6.8.  Doctor wants to continue to monitor patient due to fact that she is on dialysis.  [name], [name]  10/13/2015  2:06 AM

## 2015-10-13 NOTE — Progress Notes (Addendum)
[facility] [place] [facility]   PATIENT NAME: [name]    MR#:  [id number]  DATE OF BIRTH:  1934/02/09  SUBJECTIVE: Admitted for low back pain yesterday. Found to have hematoma. She complains of back pain and wants to sit in the chair. Hemoglobin 6.9. Did not receive blood transfusion yesterday. Admitted for supratherapeutic INR with hematoma.   CHIEF COMPLAINT:   Chief Complaint  Patient presents with  . Abdominal Pain    REVIEW OF SYSTEMS:   ROS CONSTITUTIONAL: No fever, fatigue or weakness.  EYES: No blurred or double vision.  EARS, NOSE, AND THROAT: No tinnitus or ear pain.  RESPIRATORY: No cough, shortness of breath, wheezing or hemoptysis.  CARDIOVASCULAR: No chest pain, orthopnea, edema.  GASTROINTESTINAL: No nausea, vomiting, diarrhea or abdominal pain.  GENITOURINARY: No dysuria, hematuria.  ENDOCRINE: No polyuria, nocturia,  HEMATOLOGY: No anemia, easy bruising or bleeding SKIN: No rash or lesion. MUSCULOSKELETAL: Lower back pain on the right side. But no numbness in the legs.  NEUROLOGIC: No tingling, numbness, weakness.  PSYCHIATRY: No anxiety or depression.   DRUG ALLERGIES:   Allergies  Allergen Reactions  . Codeine Other (See Comments)    Reaction:  Confusion/Hallucinations   . Contrast Media [Iodinated Diagnostic Agents] Other (See Comments)    "skin peel"  . Oxycodone Other (See Comments)    Reaction:  Confusion/Hallucinations   . Quinine Derivatives Rash    VITALS:  Blood pressure 116/67, pulse 110, temperature 98.2 F (36.8 C), temperature source Oral, resp. rate 18, height 5\' 5"  (1.651 m), weight 71.079 kg (156 lb 11.2 oz), SpO2 92 %.  PHYSICAL EXAMINATION:  GENERAL:  82 y.o.-year-old patient lying in the bed with no acute distress.  EYES: Pupils equal, round, reactive to light and accommodation. No scleral icterus. Extraocular muscles intact.  HEENT: Head atraumatic, normocephalic. Oropharynx and nasopharynx  clear.  NECK:  Supple, no jugular venous distention. No thyroid enlargement, no tenderness.  LUNGS: Normal breath sounds bilaterally, no wheezing, rales,rhonchi or crepitation. No use of accessory muscles of respiration.  CARDIOVASCULAR: S1, S2 normal. No murmurs, rubs, or gallops.  ABDOMEN: Soft, nontender, nondistended. Bowel sounds present. No organomegaly or mass.  EXTREMITIES: No pedal edema, cyanosis, or clubbing.  NEUROLOGIC: Cranial nerves II through XII are intact. Muscle strength 5/5 in all extremities. Sensation intact. Gait not checked.  PSYCHIATRIC: The patient is alert and oriented x 3.  SKIN: No obvious rash, lesion, or ulcer.  Musculoskeletal: Complains of tenderness in the right lower back. SLR test is negative. No lower extremity edema. LABORATORY PANEL:   CBC  Recent Labs Lab 10/13/15 0036  WBC 3.7  HGB 6.9*  6.8*  HCT 20.9*  PLT 301   ------------------------------------------------------------------------------------------------------------------  Chemistries   Recent Labs Lab 10/12/15 1029 10/13/15 0036  NA 133* 137  K 3.9 3.4*  CL 95* 98*  CO2 22 29  GLUCOSE 166* 112*  BUN 96* 44*  CREATININE 4.99* 2.68*  CALCIUM 7.9* 8.0*  AST 18  --   ALT 11*  --   ALKPHOS 59  --   BILITOT 0.4  --    ------------------------------------------------------------------------------------------------------------------  Cardiac Enzymes No results for input(s): TROPONINI in the last 168 hours. ------------------------------------------------------------------------------------------------------------------  RADIOLOGY:  Ct Abdomen Pelvis Wo Contrast  10/12/2015  CLINICAL DATA:  Lower abdominal pain, back pain for 2 days, elevated INR EXAM: CT ABDOMEN AND PELVIS WITHOUT CONTRAST TECHNIQUE: Multidetector CT imaging of the abdomen and pelvis was performed following the standard protocol without IV  contrast. COMPARISON:  09/14/2015 FINDINGS: Cardiomegaly is noted.   Mitral valve calcifications. Stable 8 mm nodule right base anteriorly. There is small left pleural effusion. Mild atelectasis bilateral lower lobe posteriorly. Study is limited without IV contrast. No intrahepatic biliary ductal dilatation. The unenhanced pancreas is unremarkable. Unenhanced spleen is unremarkable. Unenhanced kidneys shows bilateral cortical thinning. No adrenal gland mass is noted. There is nonobstructive calcified calculus in upper pole of the right kidney measures 1.4 cm. No hydronephrosis or hydroureter. No calcified ureteral calculi. The urinary bladder is obscured by extensive metallic artifacts from bilateral hip prosthesis. Oral contrast material was given to the patient. No small bowel obstruction. No gastric outlet obstruction. Moderate stool noted within cecum. No pericecal inflammation. The terminal ileum is unremarkable. Some colonic stool noted within transverse colon and descending colon. No distal colonic obstruction. Moderate stool noted within rectum. The rectum measures Extensive atherosclerotic calcifications of abdominal aorta, SMA, celiac trunk, splenic artery, bilateral common iliac arteries. No aortic aneurysm. There is no retroperitoneal adenopathy. No mesenteric adenopathy. There is thickening of the right iliacus muscle just anterior to iliac bone. This is a new finding. Subtle hiatal density material is noted centrally within iliacus muscle. Iliacus muscle measures at least 4.1 by 6 cm. Findings are highly suspicious for intramuscular bleed/hematoma. Study is limited without IV contrast. No destructive bony lesions are noted. Sagittal images of the spine shows extensive degenerative changes thoracolumbar spine. Significant disc space flattening with endplate sclerotic changes vacuum disc phenomenon anterior spurring at L2-L3 level. There is diffuse osteopenia. Degenerative changes bilateral SI joints. Extensive metallic artifacts from bilateral hip prosthesis. Mild  anasarca infiltration of subcutaneous fat lateral pelvic wall. IMPRESSION: 1. There is asymmetric prominence of the right iliacus muscle right posterior pelvic sidewall just anterior to iliac bone. Measures at least 4.2 x 6.1 cm. Vague high-density material is noted centrally. Findings are highly suspicious for intramuscular bleed/ hematoma. No free fluid each in noted within pelvis. The study is limited without IV contrast. In coronal image 84 the intramuscular hematoma measures 7.8 cm cranial caudally by 5.6 cm. 2. Extensive metallic artifacts limits evaluation of the lower pelvis. Bilateral hip prosthesis. Degenerative changes thoracolumbar spine. 3. Stable 8 mm nodule in right base anteriorly. Bilateral small pleural effusion. Bilateral lower lobe posterior atelectasis. 4. Again noted bilateral renal cortical thinning. Right nonobstructive nephrolithiasis. No hydronephrosis or hydroureter. 5. No small bowel obstruction. Moderate stool noted in right colon. There is moderate distension of the rectum with stool up to 6.2 cm suspicious for mild fecal impaction. These results were called by telephone at the time of interpretation on 10/12/2015 at 3:47 pm to Dr. Ileana Roup , who verbally acknowledged these results. Electronically Signed   By: Natasha Mead M.D.   On: 10/12/2015 15:48    EKG:   Orders placed or performed during the hospital encounter of 09/14/15  . ED EKG  . ED EKG  . EKG 12-Lead  . EKG 12-Lead    ASSESSMENT AND PLAN:  Lower abdominal pain and the right lower back pain secondary to abdominal wall hematoma: Patient hemoglobin dropped from 8.8 a month ago to 6.9. Has hematoma but at this time patient hemoglobin on admission 7.2 but today 6.9 not much drop. Patient can get blood transfusion with the dialysis. I discussed about blood transfusion with the patient she is agreeable for it if needed. #2 supratherapeutic INR: Patient Coumadin is on hold. Recheck the INR today. At this time no  indication to reverse because patient  has no active bleed and hemoglobin is more or less stable. #3. Peripheral vascular disease: Patient is on Coumadin. Hold the Coumadin at this time because of blood loss anemia. #4 ESRD on hemodialysis nephrology is consulted. GERD continue PPIs. #5 acute on chronic anemia secondary to chronic kidney disease and also blood loss: #6 diabetes mellitus type 2: Continue her basal insulin on the sliding scale with coverage. #7 chronic atrial fibrillation: Continue calcium channel blockers, hold the Coumadin secondary to hematoma and high INR. Plan  #8: code Status is DO NOT RESUSCITATE  All the records are reviewed and case discussed with Care Management/Social Workerr. Management plans discussed with the patient, family and they are in agreement.  CODE STATUS: DNR  TOTAL TIME TAKING CARE OF THIS PATIENT: 35 minutes.   POSSIBLE D/C IN 1-2DAYS, DEPENDING ON CLINICAL CONDITION.   Katha Hamming M.D on 10/13/2015 at 8:23 AM  Between 7am to 6pm - Pager - [phone]  After 6pm go to [email or web address] - password [email or web address]  [facility]  Office  [phone]  CC: Primary care physician; [name], MD   Note: This dictation was prepared with Dragon dictation along with smaller phrase technology. Any transcriptional errors that result from this process are unintentional.   Will get one unit blood transfusion tomorrow.

## 2015-10-14 LAB — HEMOGLOBIN AND HEMATOCRIT, BLOOD
HEMATOCRIT: 21.6 % — AB (ref 35.0–47.0)
HEMOGLOBIN: 7 g/dL — AB (ref 12.0–16.0)

## 2015-10-14 LAB — CBC
HCT: 21.4 % — ABNORMAL LOW (ref 35.0–47.0)
Hemoglobin: 7 g/dL — ABNORMAL LOW (ref 12.0–16.0)
MCH: 30.6 pg (ref 26.0–34.0)
MCHC: 32.7 g/dL (ref 32.0–36.0)
MCV: 93.6 fL (ref 80.0–100.0)
Platelets: 327 10*3/uL (ref 150–440)
RBC: 2.28 MIL/uL — ABNORMAL LOW (ref 3.80–5.20)
RDW: 18.5 % — ABNORMAL HIGH (ref 11.5–14.5)
WBC: 4.1 10*3/uL (ref 3.6–11.0)

## 2015-10-14 LAB — PROTIME-INR
INR: 1.57
Prothrombin Time: 18.8 seconds — ABNORMAL HIGH (ref 11.4–15.0)

## 2015-10-14 LAB — GLUCOSE, CAPILLARY
GLUCOSE-CAPILLARY: 86 mg/dL (ref 65–99)
GLUCOSE-CAPILLARY: 149 mg/dL — AB (ref 65–99)
Glucose-Capillary: 60 mg/dL — ABNORMAL LOW (ref 65–99)
Glucose-Capillary: 123 mg/dL — ABNORMAL HIGH (ref 65–99)

## 2015-10-14 LAB — PREPARE RBC (CROSSMATCH)

## 2015-10-14 MED ORDER — SODIUM CHLORIDE 0.9 % IV SOLN: Freq: Once | INTRAVENOUS | Status: DC

## 2015-10-14 MED ORDER — INSULIN DETEMIR 100 UNIT/ML ~~LOC~~ SOLN
11.0000 [IU] | Freq: Every day | SUBCUTANEOUS | Status: DC
Start: 2015-10-15 — End: 2015-10-15
  Administered 2015-10-15: 11 [IU] via SUBCUTANEOUS
  Filled 2015-10-14: qty 0.11

## 2015-10-14 NOTE — Care Management Important Message (Signed)
Important Message  Patient Details  Name: [name] MRN: [id number] Date of Birth: 1934-04-14   Medicare Important Message Given:  Yes    Olegario MessierKathy A Avalynn Bowe 10/14/2015, 11:45 AM

## 2015-10-14 NOTE — Progress Notes (Signed)
Pre dialysis assessment 

## 2015-10-14 NOTE — Care Management Note (Signed)
[facility] [address] [facility] 2[facility] schedule.  Admission records have been sent and I will follow up with additional records at discharge.  Ivor ReiningKim Gayle Martinez  Dialysis Coordinator  [phone]

## 2015-10-14 NOTE — Progress Notes (Signed)
Post dialysis assessment 

## 2015-10-14 NOTE — Progress Notes (Signed)
Inpatient Diabetes Program Recommendations  AACE/ADA: New Consensus Statement on Inpatient Glycemic Control (2015)  Target Ranges:  Prepandial:   less than 140 mg/dL      Peak postprandial:   less than 180 mg/dL (1-2 hours)      Critically ill patients:  140 - 180 mg/dL  Results for [name], [name] (MRN [id number]) as of 10/14/2015 10:54  Ref. Range 10/13/2015 07:40 10/13/2015 11:46 10/13/2015 16:32 10/13/2015 20:53 10/14/2015 07:31 10/14/2015 08:04  Glucose-Capillary Latest Ref Range: 65-99 mg/dL 88 409147 (H) 97 811158 (H) 60 (L) 86   Review of Glycemic Control  Diabetes history: DM2 Outpatient Diabetes medications: Levemir 11-14 units BID Current orders for Inpatient glycemic control: Levemir 11 units BID, Novolog 0-9 units TID with meals  Inpatient Diabetes Program Recommendations: Insulin - Basal: Fasting glucose 60 mg/dl this morning. Please consider decreasing Levemir to 11 units daily (instead of BID).  Thanks, Orlando PennerMarie Graviel Payeur, RN, MSN, CDE Diabetes Coordinator Inpatient Diabetes Program [phone] (Team Pager from 8am to 5pm) [phone] ([facility] office) [phone] [facility] office) [phone] [facility] office)

## 2015-10-14 NOTE — Consult Note (Signed)
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  Trauma lesions to left and right abdominal pannus.  Patient states she had a fall from her chair a few weeks ago and the bruising evolved to this.  Due to presence in a skin fold, pressure and moisture are contributing to the inability to resolve.  Wound type:Chronic nonhealing wounds.  Pressure Ulcer POA: Yes Measurement:LEft abdominal pannus  4 cm x 4.5 cm wound bed 100% adherent, yellow slough Right abdominal pannus: 3 cm x 3 cm x 0.3 cm fibrin slough present to wound bed.  LEft anterior leg 2 cm x 1 cm x 0.2 lesion to leg.  Adherent slough to wound bed.  Gangrene to right third toe.  Stable, dry. No drainage  Betadine daily Wound ZOX:[id number]:Devitalized slough to wound bed.  Drainage (amount, consistency, odor) Minimal serosanguinous drainage.  Musty odor.  Periwound:Intact Dressing procedure/placement/frequency:CLeanse wounds to abdominal pannus with NS.  Pat gently dry.  Apply Santyl to wound bed.  Cover with NS moist gauze.  Secure with 4x4 gauze and tape.  Change daily.   Wash right third toe with soap and water.  Paint with Betadine daily.   Cleanse wounds to right pretibial leg with NS.  Pat gently dry.  Apply Santyl ointment to wound bed.  Cover with NS moist gauze.  Secure with 4x4 gauze and kerlix/tape.  Change daily.     Will not follow at this time.  Please re-consult if needed.  Maple HudsonKaren Cheyrl Buley RN BSN [facility]ON Pager [phone]

## 2015-10-14 NOTE — Progress Notes (Signed)
[facility] [place] [facility]   PATIENT NAME: [name]    MR#:  [id number]  DATE OF BIRTH:  [birth year]  SUBJECTIVE: Admitted for low back pain y. Found to have hematoma. Patient has less back pain today. Hemoglobin is 7 today. No shortness of breath.   CHIEF COMPLAINT:   Chief Complaint  Patient presents with  . Abdominal Pain    REVIEW OF SYSTEMS:   ROS CONSTITUTIONAL: No fever, fatigue or weakness.  EYES: No blurred or double vision.  EARS, NOSE, AND THROAT: No tinnitus or ear pain.  RESPIRATORY: No cough, shortness of breath, wheezing or hemoptysis.  CARDIOVASCULAR: No chest pain, orthopnea, edema.  GASTROINTESTINAL: No nausea, vomiting, diarrhea or abdominal pain.  GENITOURINARY: No dysuria, hematuria.  ENDOCRINE: No polyuria, nocturia,  HEMATOLOGY: No anemia, easy bruising or bleeding SKIN: No rash or lesion. MUSCULOSKELETAL low back pain on  right side is better than before.. But no numbness in the legs.  NEUROLOGIC: No tingling, numbness, weakness.  PSYCHIATRY: No anxiety or depression.   DRUG ALLERGIES:   Allergies  Allergen Reactions  . Codeine Other (See Comments)    Reaction:  Confusion/Hallucinations   . Contrast Media [Iodinated Diagnostic Agents] Other (See Comments)    "skin peel"  . Oxycodone Other (See Comments)    Reaction:  Confusion/Hallucinations   . Quinine Derivatives Rash    VITALS:  Blood pressure 118/53, pulse 102, temperature 98.2 F (36.8 C), temperature source Oral, resp. rate 20, height 5\' 5"  (1.651 m), weight 71.079 kg (156 lb 11.2 oz), SpO2 94 %.  PHYSICAL EXAMINATION:  GENERAL:  82 y.o.-year-old patient lying in the bed with no acute distress.  EYES: Pupils equal, round, reactive to light and accommodation. No scleral icterus. Extraocular muscles intact.  HEENT: Head atraumatic, normocephalic. Oropharynx and nasopharynx clear.  NECK:  Supple, no jugular venous distention. No thyroid enlargement,  no tenderness.  LUNGS: Normal breath sounds bilaterally, no wheezing, rales,rhonchi or crepitation. No use of accessory muscles of respiration.  CARDIOVASCULAR: S1, S2 normal. No murmurs, rubs, or gallops.  ABDOMEN: Soft, nontender, nondistended. Bowel sounds present. No organomegaly or mass.  EXTREMITIES: No pedal edema, cyanosis, or clubbing.  NEUROLOGIC: Cranial nerves II through XII are intact. Muscle strength 5/5 in all extremities. Sensation intact. Gait not checked.  PSYCHIATRIC: The patient is alert and oriented x 3.  SKIN: No obvious rash, lesion, or ulcer.  Musculoskeletal:  No  tenderness in the right lower back. SLR test is negative. No lower extremity edema. LABORATORY PANEL:   CBC  Recent Labs Lab 10/14/15 0813 10/14/15 0937  WBC 4.1  --   HGB 7.0* 7.0*  HCT 21.4* 21.6*  PLT 327  --    ------------------------------------------------------------------------------------------------------------------  Chemistries   Recent Labs Lab 10/12/15 1029 10/13/15 0036  NA 133* 137  K 3.9 3.4*  CL 95* 98*  CO2 22 29  GLUCOSE 166* 112*  BUN 96* 44*  CREATININE 4.99* 2.68*  CALCIUM 7.9* 8.0*  AST 18  --   ALT 11*  --   ALKPHOS 59  --   BILITOT 0.4  --    ------------------------------------------------------------------------------------------------------------------  Cardiac Enzymes No results for input(s): TROPONINI in the last 168 hours. ------------------------------------------------------------------------------------------------------------------  RADIOLOGY:  Ct Abdomen Pelvis Wo Contrast  10/12/2015  CLINICAL DATA:  Lower abdominal pain, back pain for 2 days, elevated INR EXAM: CT ABDOMEN AND PELVIS WITHOUT CONTRAST TECHNIQUE: Multidetector CT imaging of the abdomen and pelvis was performed following the standard protocol without  IV contrast. COMPARISON:  09/14/2015 FINDINGS: Cardiomegaly is noted.  Mitral valve calcifications. Stable 8 mm nodule right base  anteriorly. There is small left pleural effusion. Mild atelectasis bilateral lower lobe posteriorly. Study is limited without IV contrast. No intrahepatic biliary ductal dilatation. The unenhanced pancreas is unremarkable. Unenhanced spleen is unremarkable. Unenhanced kidneys shows bilateral cortical thinning. No adrenal gland mass is noted. There is nonobstructive calcified calculus in upper pole of the right kidney measures 1.4 cm. No hydronephrosis or hydroureter. No calcified ureteral calculi. The urinary bladder is obscured by extensive metallic artifacts from bilateral hip prosthesis. Oral contrast material was given to the patient. No small bowel obstruction. No gastric outlet obstruction. Moderate stool noted within cecum. No pericecal inflammation. The terminal ileum is unremarkable. Some colonic stool noted within transverse colon and descending colon. No distal colonic obstruction. Moderate stool noted within rectum. The rectum measures Extensive atherosclerotic calcifications of abdominal aorta, SMA, celiac trunk, splenic artery, bilateral common iliac arteries. No aortic aneurysm. There is no retroperitoneal adenopathy. No mesenteric adenopathy. There is thickening of the right iliacus muscle just anterior to iliac bone. This is a new finding. Subtle hiatal density material is noted centrally within iliacus muscle. Iliacus muscle measures at least 4.1 by 6 cm. Findings are highly suspicious for intramuscular bleed/hematoma. Study is limited without IV contrast. No destructive bony lesions are noted. Sagittal images of the spine shows extensive degenerative changes thoracolumbar spine. Significant disc space flattening with endplate sclerotic changes vacuum disc phenomenon anterior spurring at L2-L3 level. There is diffuse osteopenia. Degenerative changes bilateral SI joints. Extensive metallic artifacts from bilateral hip prosthesis. Mild anasarca infiltration of subcutaneous fat lateral pelvic wall.  IMPRESSION: 1. There is asymmetric prominence of the right iliacus muscle right posterior pelvic sidewall just anterior to iliac bone. Measures at least 4.2 x 6.1 cm. Vague high-density material is noted centrally. Findings are highly suspicious for intramuscular bleed/ hematoma. No free fluid each in noted within pelvis. The study is limited without IV contrast. In coronal image 84 the intramuscular hematoma measures 7.8 cm cranial caudally by 5.6 cm. 2. Extensive metallic artifacts limits evaluation of the lower pelvis. Bilateral hip prosthesis. Degenerative changes thoracolumbar spine. 3. Stable 8 mm nodule in right base anteriorly. Bilateral small pleural effusion. Bilateral lower lobe posterior atelectasis. 4. Again noted bilateral renal cortical thinning. Right nonobstructive nephrolithiasis. No hydronephrosis or hydroureter. 5. No small bowel obstruction. Moderate stool noted in right colon. There is moderate distension of the rectum with stool up to 6.2 cm suspicious for mild fecal impaction. These results were called by telephone at the time of interpretation on 10/12/2015 at 3:47 pm to Dr. Ileana Roup , who verbally acknowledged these results. Electronically Signed   By: Natasha Mead M.D.   On: 10/12/2015 15:48    EKG:   Orders placed or performed during the hospital encounter of 09/14/15  . ED EKG  . ED EKG  . EKG 12-Lead  . EKG 12-Lead    ASSESSMENT AND PLAN:  Lower abdominal pain and the right lower back pain secondary to abdominal wall hematoma: Patient hemoglobin dropped from 8.8 a month ago to 6.9.  Abdominal pain improved. Patient hemoglobin is 7,. One unit of blood transfusion with hemodialysis today. Discussed with Dr. Mosetta Pigeon  nephrologist.  #2 supratherapeutic INR: INR improved from 4 to 1.57. Restart the Coumadin at discharge if hemoglobin is stable tomorrow #3. Peripheral vascular disease: Patient is on Coumadin. H  #4 ESRD on hemodialysis nephrology is consulted. GERD  continue PPIs. #5 acute on chronic anemia secondary to chronic kidney disease and also blood loss: Transfuse 1 unit of packed RBC. #6 diabetes mellitus type 2: Adjust the Lantus to 11 units daily as per diabetes the coordinator recommendation #7 chronic atrial fibrillation: Continue calcium channel blockers, hold the Coumadin secondary to hematoma and high INR.   #8: code Status is DO NOT RESUSCITATE Discharge home tomorrow if hemoglobin continues to be stable. Discussed this with patient's daughter All the records are reviewed and case discussed with Care Management/Social Workerr. Management plans discussed with the patient, family and they are in agreement.  CODE STATUS: DNR  TOTAL TIME TAKING CARE OF THIS PATIENT: 35 minutes.   POSSIBLE D/C IN 1-2DAYS, DEPENDING ON CLINICAL CONDITION.   Katha HammingKONIDENA,Murielle Stang M.D on 10/14/2015 at 11:53 AM  Between 7am to 6pm - Pager - [phone]  After 6pm go to [email or web address] - password [email or web address]  [facility] [place] [facility]  Office  [phone]  CC: Primary care physician; Rolm GalaGRANDIS, HEIDI, MD   Note: This dictation was prepared with Dragon dictation along with smaller phrase technology. Any transcriptional errors that result from this process are unintentional.   Will get one unit blood transfusion tomorrow.

## 2015-10-14 NOTE — Progress Notes (Signed)
Treatment completion assessment 

## 2015-10-14 NOTE — Progress Notes (Signed)
Treatment initiation assessment 

## 2015-10-14 NOTE — Progress Notes (Signed)
Subjective:   Doing well today Feels generalized weakness Hemoglobin remains low at 7.0 Plan for blood transfusion with dialysis today   Objective:  Vital signs in last 24 hours:  Temp:  [98 F (36.7 C)-99 F (37.2 C)] 99 F (37.2 C) (05/15 1241) Pulse Rate:  [89-108] 89 (05/15 1241) Resp:  [16-20] 16 (05/15 1241) BP: (94-118)/(48-63) 116/48 mmHg (05/15 1241) SpO2:  [94 %-97 %] 94 % (05/15 1241)  Weight change:  Filed Weights   10/12/15 2035 10/12/15 2300 10/12/15 2353  Weight: 73.7 kg (162 lb 7.7 oz) 71.6 kg (157 lb 13.6 oz) 71.079 kg (156 lb 11.2 oz)    Intake/Output:    Intake/Output Summary (Last 24 hours) at 10/14/15 1319 Last data filed at 10/14/15 0901  Gross per 24 hour  Intake    600 ml  Output      0 ml  Net    600 ml     Physical Exam: General: NAD, Sitting up in chair   HEENT Anicteric, moist oral mucus membranes  Neck supple  Pulm/lungs Clear b/l  CVS/Heart Irregular, no rub or gallop  Abdomen:  Soft, NT  Extremities: No peripheral edema  Neurologic: Alert, oriented  Skin: No acute rashes  Access: AVF       Basic Metabolic Panel:   Recent Labs Lab 10/12/15 1029 10/13/15 0036  NA 133* 137  K 3.9 3.4*  CL 95* 98*  CO2 22 29  GLUCOSE 166* 112*  BUN 96* 44*  CREATININE 4.99* 2.68*  CALCIUM 7.9* 8.0*  PHOS 6.5*  --      CBC:  Recent Labs Lab 10/12/15 1029  10/13/15 0036 10/13/15 0915 10/13/15 1636 10/14/15 0813 10/14/15 0937  WBC 4.5  --  3.7  --   --  4.1  --   NEUTROABS 3.0  --   --   --   --   --   --   HGB 7.2*  < > 6.9*  6.8* 6.7* 6.8* 7.0* 7.0*  HCT 21.8*  --  20.9*  --   --  21.4* 21.6*  MCV 91.2  --  92.6  --   --  93.6  --   PLT 299  --  301  --   --  327  --   < > = values in this interval not displayed.    Microbiology:  Recent Results (from the past 720 hour(s))  MRSA PCR Screening     Status: None   Collection Time: 10/13/15 12:33 AM  Result Value Ref Range Status   MRSA by PCR NEGATIVE NEGATIVE  Final    Comment:        The GeneXpert MRSA Assay (FDA approved for NASAL specimens only), is one component of a comprehensive MRSA colonization surveillance program. It is not intended to diagnose MRSA infection nor to guide or monitor treatment for MRSA infections.     Coagulation Studies:  Recent Labs  10/12/15 1029 10/13/15 0915 10/14/15 0813  LABPROT 44.3* 38.4* 18.8*  INR 4.91* 4.07* 1.57    Urinalysis: No results for input(s): COLORURINE, LABSPEC, PHURINE, GLUCOSEU, HGBUR, BILIRUBINUR, KETONESUR, PROTEINUR, UROBILINOGEN, NITRITE, LEUKOCYTESUR in the last 72 hours.  Invalid input(s): APPERANCEUR    Imaging: Ct Abdomen Pelvis Wo Contrast  10/12/2015  CLINICAL DATA:  Lower abdominal pain, back pain for 2 days, elevated INR EXAM: CT ABDOMEN AND PELVIS WITHOUT CONTRAST TECHNIQUE: Multidetector CT imaging of the abdomen and pelvis was performed following the standard protocol without IV contrast. COMPARISON:  09/14/2015 FINDINGS: Cardiomegaly is noted.  Mitral valve calcifications. Stable 8 mm nodule right base anteriorly. There is small left pleural effusion. Mild atelectasis bilateral lower lobe posteriorly. Study is limited without IV contrast. No intrahepatic biliary ductal dilatation. The unenhanced pancreas is unremarkable. Unenhanced spleen is unremarkable. Unenhanced kidneys shows bilateral cortical thinning. No adrenal gland mass is noted. There is nonobstructive calcified calculus in upper pole of the right kidney measures 1.4 cm. No hydronephrosis or hydroureter. No calcified ureteral calculi. The urinary bladder is obscured by extensive metallic artifacts from bilateral hip prosthesis. Oral contrast material was given to the patient. No small bowel obstruction. No gastric outlet obstruction. Moderate stool noted within cecum. No pericecal inflammation. The terminal ileum is unremarkable. Some colonic stool noted within transverse colon and descending colon. No distal  colonic obstruction. Moderate stool noted within rectum. The rectum measures Extensive atherosclerotic calcifications of abdominal aorta, SMA, celiac trunk, splenic artery, bilateral common iliac arteries. No aortic aneurysm. There is no retroperitoneal adenopathy. No mesenteric adenopathy. There is thickening of the right iliacus muscle just anterior to iliac bone. This is a new finding. Subtle hiatal density material is noted centrally within iliacus muscle. Iliacus muscle measures at least 4.1 by 6 cm. Findings are highly suspicious for intramuscular bleed/hematoma. Study is limited without IV contrast. No destructive bony lesions are noted. Sagittal images of the spine shows extensive degenerative changes thoracolumbar spine. Significant disc space flattening with endplate sclerotic changes vacuum disc phenomenon anterior spurring at L2-L3 level. There is diffuse osteopenia. Degenerative changes bilateral SI joints. Extensive metallic artifacts from bilateral hip prosthesis. Mild anasarca infiltration of subcutaneous fat lateral pelvic wall. IMPRESSION: 1. There is asymmetric prominence of the right iliacus muscle right posterior pelvic sidewall just anterior to iliac bone. Measures at least 4.2 x 6.1 cm. Vague high-density material is noted centrally. Findings are highly suspicious for intramuscular bleed/ hematoma. No free fluid each in noted within pelvis. The study is limited without IV contrast. In coronal image 84 the intramuscular hematoma measures 7.8 cm cranial caudally by 5.6 cm. 2. Extensive metallic artifacts limits evaluation of the lower pelvis. Bilateral hip prosthesis. Degenerative changes thoracolumbar spine. 3. Stable 8 mm nodule in right base anteriorly. Bilateral small pleural effusion. Bilateral lower lobe posterior atelectasis. 4. Again noted bilateral renal cortical thinning. Right nonobstructive nephrolithiasis. No hydronephrosis or hydroureter. 5. No small bowel obstruction. Moderate  stool noted in right colon. There is moderate distension of the rectum with stool up to 6.2 cm suspicious for mild fecal impaction. These results were called by telephone at the time of interpretation on 10/12/2015 at 3:47 pm to Dr. Ileana Roup , who verbally acknowledged these results. Electronically Signed   By: Natasha Mead M.D.   On: 10/12/2015 15:48     Medications:     . sodium chloride   Intravenous Once  . calcium acetate (Phos Binder)  1,334 mg Oral TID WC  . collagenase   Topical Daily  . diltiazem  120 mg Oral Daily  . famotidine  10 mg Oral BID  . furosemide  80 mg Oral BID  . gabapentin  100 mg Oral Once per day on Sun Mon Wed Fri  . gabapentin  300 mg Oral Once per day on Tue Thu Sat  . insulin aspart  0-9 Units Subcutaneous TID WC  . [START ON 10/15/2015] insulin detemir  11 Units Subcutaneous Daily  . multivitamin  1 tablet Oral Daily  . nystatin   Topical BID  . omega-3  acid ethyl esters  1 g Oral BID  . PARoxetine  40 mg Oral QHS  . pravastatin  20 mg Oral Daily  . senna-docusate  1 tablet Oral BID     Assessment/ Plan:  82 y.o. female with past medical history of atrial fibrillation on Coumadin, hypertension, gout, insulin-dependent diabetes mellitus type 2, diabetic neuropathy, CVA with right visual field blindness, hyperlipidemia, major depressive disorder, overactive bladder with incontinence, generalized anxiety disorder, congestive heart failure diastolic, parathyroidectomy, and osteoarthritis, s/p left CEA 07/26/15. Patient has never smoked.  [facility]   1. ESRD on HD TTHS:  Next HD Today instead of Tuesday to allow for blood transfusion with dialysis  2. Anemia chronic kidney disease and blood loss.  - EPO with HD - Intra muscular hematoma  3. Secondary hyperparathyroidism.  - continue calcium acetate          LOS: 2 Patriciaann Rabanal 5/15/20171:19 PM

## 2015-10-14 NOTE — Clinical Social Work Note (Signed)
Clinical Social Work Assessment  Patient Details  Name: [name] MRN: [id number] Date of Birth: 02-02-1934  Date of referral:  10/14/15               Reason for consult:  Facility Placement                Permission sought to share information with:    Permission granted to share information::     Name::        Agency::     Relationship::     Contact Information:     Housing/Transportation Living arrangements for the past 2 months:  Skilled Nursing Facility, Single Family Home Source of Information:  Patient Patient Interpreter Needed:  None Criminal Activity/Legal Involvement Pertinent to Current Situation/Hospitalization:  No - Comment as needed Significant Relationships:    Lives with:  Self Do you feel safe going back to the place where you live?  Yes Need for family participation in patient care:  Yes (Comment)  Care giving concerns:  [facility].   Social Worker assessment / plan:  Patient resides alone and [facility] [employer] [employer]. [facility] and patient states she was [facility] with [facility]. Patient states that she intends to return home at discharge from this hospitalization. RN CM notified of recent home health arrangements.  Employment status:  Retired Health and safety inspectornsurance information:  Medicare PT Recommendations:  Not assessed at this time Information / Referral to community resources:     Patient/Family's Response to care:  Patient expressed appreciation for CSW assistance.  Patient/Family's Understanding of and Emotional Response to Diagnosis, Current Treatment, and Prognosis:  Patient very pleasant and eager to return home at discharge.  Emotional Assessment Appearance:  Appears stated age Attitude/Demeanor/Rapport:   (pleasant and cooperative) Affect (typically observed):  Accepting, Calm, Happy Orientation:  Oriented to Self, Oriented to Place,  Oriented to  Time, Oriented to Situation Alcohol / Substance use:  Not Applicable Psych involvement (Current and /or in the community):  No (Comment)  Discharge Needs  Concerns to be addressed:  Care Coordination Readmission within the last 30 days:  No Current discharge risk:  None Barriers to Discharge:  No Barriers Identified   York SpanielMonica Cammy Sanjurjo, LCSW 10/14/2015, 10:38 AM

## 2015-10-15 LAB — GLUCOSE, CAPILLARY
GLUCOSE-CAPILLARY: 110 mg/dL — AB (ref 65–99)
GLUCOSE-CAPILLARY: 95 mg/dL (ref 65–99)

## 2015-10-15 LAB — HEMOGLOBIN AND HEMATOCRIT, BLOOD
HEMATOCRIT: 24 % — AB (ref 35.0–47.0)
Hemoglobin: 8 g/dL — ABNORMAL LOW (ref 12.0–16.0)

## 2015-10-15 MED ORDER — INSULIN DETEMIR 100 UNIT/ML ~~LOC~~ SOLN: 11.0000 [IU] | Freq: Every day | SUBCUTANEOUS | Status: DC

## 2015-10-15 MED ORDER — WARFARIN SODIUM 2 MG PO TABS: 2.0000 mg | ORAL_TABLET | Freq: Every day | ORAL | Status: DC

## 2015-10-15 NOTE — Progress Notes (Signed)
10/15/2015 12:45  [name] to be D/C'd Home per MD order.  Discussed prescriptions and follow up appointments with the patient. Prescriptions given to patient, medication list explained in detail. Pt verbalized understanding.    Medication List    TAKE these medications        acetaminophen 650 MG CR tablet  Commonly known as:  TYLENOL  Take 1,300 mg by mouth 2 (two) times daily.     albuterol 108 (90 Base) MCG/ACT inhaler  Commonly known as:  PROVENTIL HFA;VENTOLIN HFA  Inhale 1-2 puffs into the lungs every 6 (six) hours as needed for wheezing or shortness of breath.     aspirin 81 MG chewable tablet  Chew 1 tablet (81 mg total) by mouth daily.     Biotin 1 MG Caps  Take 1 mg by mouth daily.     bisacodyl 5 MG EC tablet  Commonly known as:  DULCOLAX  Take 10 mg by mouth daily as needed for moderate constipation.     calcium acetate (Phos Binder) 667 MG/5ML Soln  Commonly known as:  PHOSLYRA  Take 1,334 mg by mouth 3 (three) times daily with meals.     diltiazem 120 MG 24 hr capsule  Commonly known as:  TIAZAC  Take 120 mg by mouth every morning.     diphenhydrAMINE 25 mg capsule  Commonly known as:  BENADRYL  Take 1 capsule (25 mg total) by mouth every 6 (six) hours as needed (rash, itching).     Fish Oil 1200 MG Caps  Take 1,200 mg by mouth daily.     folic acid-vitamin b complex-vitamin c-selenium-zinc 3 MG Tabs tablet  Take 1 tablet by mouth daily.     furosemide 80 MG tablet  Commonly known as:  LASIX  Take 80 mg by mouth 2 (two) times daily.     gabapentin 300 MG capsule  Commonly known as:  NEURONTIN  Take 300 mg by mouth daily. Pt takes on Tuesday, Thursday, and Saturday after dialysis.     gabapentin 100 MG capsule  Commonly known as:  NEURONTIN  Take 100 mg by mouth at bedtime. Pt takes on Sunday, Monday, Wednesday, and Friday.     insulin detemir 100 UNIT/ML injection  Commonly known as:  LEVEMIR  Inject 0.11 mLs (11 Units total) into the  skin daily.     lidocaine-prilocaine cream  Commonly known as:  EMLA  Apply 1 application topically as needed (prior to accessing port).     nystatin powder  Generic drug:  nystatin  Apply topically 2 (two) times daily as needed.     PARoxetine 40 MG tablet  Commonly known as:  PAXIL  Take 40 mg by mouth at bedtime.     pravastatin 20 MG tablet  Commonly known as:  PRAVACHOL  Take 1 tablet (20 mg total) by mouth daily.     ranitidine 150 MG tablet  Commonly known as:  ZANTAC  Take 150 mg by mouth daily.     senna-docusate 8.6-50 MG tablet  Commonly known as:  Senokot-S  Take 1 tablet by mouth 2 (two) times daily as needed for mild constipation or moderate constipation.     traMADol 50 MG tablet  Commonly known as:  ULTRAM  Take 1 tablet (50 mg total) by mouth every 6 (six) hours as needed for moderate pain.     warfarin 2 MG tablet  Commonly known as:  COUMADIN  Take 1 tablet (2 mg total) by mouth  daily.        Filed Vitals:   10/14/15 2342 10/15/15 0514  BP: 124/50 120/60  Pulse: 99 92  Temp: 98.2 F (36.8 C) 98.2 F (36.8 C)  Resp: 20 20    Skin clean, dry and intact without evidence of skin break down, no evidence of skin tears noted. IV catheter discontinued intact. Site without signs and symptoms of complications. Dressing and pressure applied. Pt denies pain at this time. No complaints noted.  An After Visit Summary was printed and given to the patient. Patient escorted via WC, and D/C home via private auto.  Bradly Chris

## 2015-10-15 NOTE — Care Management (Signed)
Patient admitted for acute on chronic anemia.  [facility].  Patient lives at home with her daughter.  [facility] with [facility] RN [facility] and PT.  [place] [facility] notified of admission.  Patient has RW, toliet seat, grab bar, transfer seat, scooter chair.  [facility]  Resumption orders placed.  Patient to discharged today.  Patient's daughter is out of town.  Patient to discharge to son David's home [phone]  [address] [place].  [place] [facility] notified.  Son to transport at discharge RNCM signing off.

## 2015-10-15 NOTE — Progress Notes (Signed)
Subjective:   Doing well today Hemoglobin improved blood transfusion with dialysis given yesterday   Objective:  Vital signs in last 24 hours:  Temp:  [97.5 F (36.4 C)-98.2 F (36.8 C)] 98.2 F (36.8 C) (05/16 0514) Pulse Rate:  [67-107] 92 (05/16 0514) Resp:  [14-26] 20 (05/16 0514) BP: (111-134)/(49-66) 120/60 mmHg (05/16 0514) SpO2:  [96 %-100 %] 96 % (05/16 0514) Weight:  [70.4 kg (155 lb 3.3 oz)-72 kg (158 lb 11.7 oz)] 70.4 kg (155 lb 3.3 oz) (05/15 2342)  Weight change:  Filed Weights   10/14/15 1905 10/14/15 2300 10/14/15 2342  Weight: 72 kg (158 lb 11.7 oz) 70.7 kg (155 lb 13.8 oz) 70.4 kg (155 lb 3.3 oz)    Intake/Output:    Intake/Output Summary (Last 24 hours) at 10/15/15 1351 Last data filed at 10/15/15 0900  Gross per 24 hour  Intake    880 ml  Output   1010 ml  Net   -130 ml     Physical Exam: General: NAD, Sitting up in chair   HEENT Anicteric, moist oral mucus membranes  Neck supple  Pulm/lungs Clear b/l  CVS/Heart Irregular, no rub or gallop  Abdomen:  Soft, NT  Extremities: No peripheral edema  Neurologic: Alert, oriented  Skin: No acute rashes  Access: AVF       Basic Metabolic Panel:   Recent Labs Lab 10/12/15 1029 10/13/15 0036  NA 133* 137  K 3.9 3.4*  CL 95* 98*  CO2 22 29  GLUCOSE 166* 112*  BUN 96* 44*  CREATININE 4.99* 2.68*  CALCIUM 7.9* 8.0*  PHOS 6.5*  --      CBC:  Recent Labs Lab 10/12/15 1029  10/13/15 0036 10/13/15 0915 10/13/15 1636 10/14/15 0813 10/14/15 0937 10/15/15 0525  WBC 4.5  --  3.7  --   --  4.1  --   --   NEUTROABS 3.0  --   --   --   --   --   --   --   HGB 7.2*  < > 6.9*  6.8* 6.7* 6.8* 7.0* 7.0* 8.0*  HCT 21.8*  --  20.9*  --   --  21.4* 21.6* 24.0*  MCV 91.2  --  92.6  --   --  93.6  --   --   PLT 299  --  301  --   --  327  --   --   < > = values in this interval not displayed.    Microbiology:  Recent Results (from the past 720 hour(s))  MRSA PCR Screening     Status:  None   Collection Time: 10/13/15 12:33 AM  Result Value Ref Range Status   MRSA by PCR NEGATIVE NEGATIVE Final    Comment:        The GeneXpert MRSA Assay (FDA approved for NASAL specimens only), is one component of a comprehensive MRSA colonization surveillance program. It is not intended to diagnose MRSA infection nor to guide or monitor treatment for MRSA infections.     Coagulation Studies:  Recent Labs  10/13/15 0915 10/14/15 0813  LABPROT 38.4* 18.8*  INR 4.07* 1.57    Urinalysis: No results for input(s): COLORURINE, LABSPEC, PHURINE, GLUCOSEU, HGBUR, BILIRUBINUR, KETONESUR, PROTEINUR, UROBILINOGEN, NITRITE, LEUKOCYTESUR in the last 72 hours.  Invalid input(s): APPERANCEUR    Imaging: No results found.   Medications:     . sodium chloride   Intravenous Once  . calcium acetate (Phos Binder)  1,334 mg  Oral TID WC  . collagenase   Topical Daily  . diltiazem  120 mg Oral Daily  . famotidine  10 mg Oral BID  . furosemide  80 mg Oral BID  . gabapentin  100 mg Oral Once per day on Sun Mon Wed Fri  . gabapentin  300 mg Oral Once per day on Tue Thu Sat  . insulin aspart  0-9 Units Subcutaneous TID WC  . insulin detemir  11 Units Subcutaneous Daily  . multivitamin  1 tablet Oral Daily  . nystatin   Topical BID  . omega-3 acid ethyl esters  1 g Oral BID  . PARoxetine  40 mg Oral QHS  . pravastatin  20 mg Oral Daily  . senna-docusate  1 tablet Oral BID     Assessment/ Plan:  82 y.o. female with past medical history of atrial fibrillation on Coumadin, hypertension, gout, insulin-dependent diabetes mellitus type 2, diabetic neuropathy, CVA with right visual field blindness, hyperlipidemia, major depressive disorder, overactive bladder with incontinence, generalized anxiety disorder, congestive heart failure diastolic, parathyroidectomy, and osteoarthritis, s/p left CEA 07/26/15. Patient has never smoked.  [facility] [employer].   1. ESRD on HD TTHS:   Continue routine dialysis at her regular times  2. Anemia chronic kidney disease and blood loss.  - EPO with HD - Intra muscular hematoma - Hgb improved with blood transfusioin and appears to be stable  3. Secondary hyperparathyroidism.  - continue calcium acetate          LOS: 3 Jahmere Bramel 5/16/20171:51 PM

## 2015-10-15 NOTE — Discharge Summary (Signed)
[name], is a 82 y.o. female  DOB November 07, 1933  MRN [id number].  Admission date:  10/12/2015  Admitting Physician  Altamese Dilling, MD  Discharge Date:  10/15/2015   Primary MD  [name], MD  Recommendations for primary care physician for things to follow:     Admission Diagnosis  Elevated INR [R79.1] Guaiac positive stools [R19.5] Iliopsoas muscle hematoma, unspecified laterality, initial encounter [S70.10XA]   Discharge Diagnosis  Elevated INR [R79.1] Guaiac positive stools [R19.5] Iliopsoas muscle hematoma, unspecified laterality, initial encounter [S70.10XA]    Principal Problem:   Hematoma      Past Medical History  Diagnosis Date  . Chronic kidney disease   . Dysrhythmia   . Diabetes mellitus without complication (HCC)   . Hypertension   . Stroke (HCC)   . Arthritis     gout  . Dialysis patient (HCC)   . Pleural effusion   . Pulmonary hypertension (HCC)   . Renal insufficiency   . CHF (congestive heart failure) (HCC)   . Peripheral vascular disease (HCC)   . Shortness of breath dyspnea     with exertion  . GERD (gastroesophageal reflux disease)   . Anemia   . Restless leg syndrome   . Atrial fibrillation [facility])     Past Surgical History  Procedure Laterality Date  . Cholecystectomy    . Peripheral vascular catheterization N/A 10/08/2014    Procedure: A/V Shuntogram/Fistulagram;  Surgeon: Annice Needy, MD;  Location: [facility] INVASIVE CV LAB;  Service: Cardiovascular;  Laterality: N/A;  . Joint replacement      bilateral hip  . Peripheral vascular catheterization N/A 03/04/2015    Procedure: A/V Shuntogram/Fistulagram;  Surgeon: Annice Needy, MD;  Location: [facility] INVASIVE CV LAB;  Service: Cardiovascular;  Laterality: N/A;  . Peripheral vascular catheterization N/A 03/04/2015     Procedure: A/V Shunt Intervention;  Surgeon: Annice Needy, MD;  Location: [facility] INVASIVE CV LAB;  Service: Cardiovascular;  Laterality: N/A;  . Fistulagram ([facility] hx)    . Parathyroidectomy    . Tonsillectomy    . Cardiac catheterization    . Eye surgery    . Cataract extraction, bilateral    . Amputation toe Left 06/07/2015    Procedure: AMPUTATION TOE;  Surgeon: Gwyneth Revels, DPM;  Location: [facility] ORS;  Service: Podiatry;  Laterality: Left;  Marland Kitchen [employer] bladder interstem      currently turned off for MRI in January  . Endarterectomy Left 07/26/2015    Procedure: ENDARTERECTOMY CAROTID;  Surgeon: Renford Dills, MD;  Location: [facility] ORS;  Service: Vascular;  Laterality: Left;  . Thyroidectomy, partial    . Peripheral vascular catheterization N/A 09/16/2015    Procedure: Lower Extremity Angiography;  Surgeon: Annice Needy, MD;  Location: [facility] INVASIVE CV LAB;  Service: Cardiovascular;  Laterality: N/A;  . Peripheral vascular catheterization  09/16/2015    Procedure: Lower Extremity Intervention;  Surgeon: Annice Needy, MD;  Location: [facility] INVASIVE CV LAB;  Service: Cardiovascular;;       History of present illness and  Hospital Course:     Kindly see H&P for history of present illness and admission details, please review complete Labs, Consult reports and Test reports for all details in brief  HPI  from the history and physical done on the day of admission May 13 for abdominal pain. Patient has end-stage renal disease on hemodialysis, diabetes, essential hypertension, chronic atrial fibrillation, recent arterial thrombus removed by vascular and taking Coumadin comes in  because of abdominal pain. INR and admission more than 40. Patient has been adjusting the dose of Coumadin recently. Low-back pain, also rotated some abdominal pain. The abdomen showed hematoma on the 20th was on the left side so patient is admitted for supratherapeutic INR, hematoma, blood loss anemia.  Hospital Course  #1.  Right lower back pain, abdominal pain secondary to hematoma of right iliac muscle. coumadinwas held secondary to elevated INR: Patient received conservative treatment, stopped the Coumadin, patient hemoglobin followed closely. Patient hemoglobin and admission 6.9. Patient hemoglobin improved to 7.2 with no transfusion but because of anemia or blood loss on top of anemia of chronic kidney disease patient received 1 unit of blood transfusion yesterday during the hemodialysis. Patient hemoglobin today is more than 8. INR is down to 1.5 today. Patient received a 1 mg of vitamin K IV. I told the patient that she can take Coumadin 2 mg daily and then see how she does and have a dialysis center check the INR and adjust the Coumadin further. 2.     pvd; start the Coumadin at low-dose. #3 ESRD hemodialysis. Tuesday, Thursday, Saturday. Patient received hemodialysis yesterday, patient can follow up with nephrology as an outpatient for her dialysis needs,  4.abetes mellitus type 2: Patient to seen by diabetes coordinator here, she recommended increasing the Levemir 11 units daily because of fasting blood glucose 60 yesterday. #5. History of left carotid endarterectomy. #6 Gen. anxiety disorder. #7/chronic wounds to anterior pretibial leg, right third toe is stable gangreneCleanse wounds to right pretibial leg with NS. Pat gently dry. Apply Santyl ointment to wound bed. Cover with NS moist gauze. Secure with 4x4 gauze and kerlix/tape. Change daily.   patient also has left and right abdominal pannus lesions, patient had a fall from a chair. Week ago bruising this area. Seen by wound care nurse, recommended daily dressing changes and apply Santyl to the wound bed. #8 disposition to home. [facility]. [facility]    Discharge Condition: stable   Follow UP  Follow-up Information    Follow up with Rolm GalaGRANDIS, HEIDI, MD In 3 days.    Specialty:  Family Medicine   Contact information:   [address] [place] [address] [phone]         Discharge Instructions  and  Discharge Medications        Medication List    TAKE these medications        acetaminophen 650 MG CR tablet  Commonly known as:  TYLENOL  Take 1,300 mg by mouth 2 (two) times daily.     albuterol 108 (90 Base) MCG/ACT inhaler  Commonly known as:  PROVENTIL HFA;VENTOLIN HFA  Inhale 1-2 puffs into the lungs every 6 (six) hours as needed for wheezing or shortness of breath.     aspirin 81 MG chewable tablet  Chew 1 tablet (81 mg total) by mouth daily.     Biotin 1 MG Caps  Take 1 mg by mouth daily.     bisacodyl 5 MG EC tablet  Commonly known as:  DULCOLAX  Take 10 mg by mouth daily as needed for moderate constipation.     calcium acetate (Phos Binder) 667 MG/5ML Soln  Commonly known as:  PHOSLYRA  Take 1,334 mg by mouth 3 (three) times daily with meals.     diltiazem 120 MG 24 hr capsule  Commonly known as:  TIAZAC  Take 120 mg by  mouth every morning.     diphenhydrAMINE 25 mg capsule  Commonly known as:  BENADRYL  Take 1 capsule (25 mg total) by mouth every 6 (six) hours as needed (rash, itching).     Fish Oil 1200 MG Caps  Take 1,200 mg by mouth daily.     folic acid-vitamin b complex-vitamin c-selenium-zinc 3 MG Tabs tablet  Take 1 tablet by mouth daily.     furosemide 80 MG tablet  Commonly known as:  LASIX  Take 80 mg by mouth 2 (two) times daily.     gabapentin 300 MG capsule  Commonly known as:  NEURONTIN  Take 300 mg by mouth daily. Pt takes on Tuesday, Thursday, and Saturday after dialysis.     gabapentin 100 MG capsule  Commonly known as:  NEURONTIN  Take 100 mg by mouth at bedtime. Pt takes on Sunday, Monday, Wednesday, and Friday.     Insulin Detemir 100 UNIT/ML Pen  Commonly known as:  LEVEMIR  Inject 11-14 Units into the skin 2 (two) times daily. 0730, 1730     lidocaine-prilocaine cream   Commonly known as:  EMLA  Apply 1 application topically as needed (prior to accessing port).     nystatin powder  Generic drug:  nystatin  Apply topically 2 (two) times daily as needed.     PARoxetine 40 MG tablet  Commonly known as:  PAXIL  Take 40 mg by mouth at bedtime.     pravastatin 20 MG tablet  Commonly known as:  PRAVACHOL  Take 1 tablet (20 mg total) by mouth daily.     ranitidine 150 MG tablet  Commonly known as:  ZANTAC  Take 150 mg by mouth daily.     senna-docusate 8.6-50 MG tablet  Commonly known as:  Senokot-S  Take 1 tablet by mouth 2 (two) times daily as needed for mild constipation or moderate constipation.     traMADol 50 MG tablet  Commonly known as:  ULTRAM  Take 1 tablet (50 mg total) by mouth every 6 (six) hours as needed for moderate pain.     warfarin 2 MG tablet  Commonly known as:  COUMADIN  Take 1 tablet (2 mg total) by mouth daily.          Diet and Activity recommendation: See Discharge Instructions above   Consults obtained - nephrology   Major procedures and Radiology Reports - PLEASE review detailed and final reports for all details, in brief -      Ct Abdomen Pelvis Wo Contrast  10/12/2015  CLINICAL DATA:  Lower abdominal pain, back pain for 2 days, elevated INR EXAM: CT ABDOMEN AND PELVIS WITHOUT CONTRAST TECHNIQUE: Multidetector CT imaging of the abdomen and pelvis was performed following the standard protocol without IV contrast. COMPARISON:  09/14/2015 FINDINGS: Cardiomegaly is noted.  Mitral valve calcifications. Stable 8 mm nodule right base anteriorly. There is small left pleural effusion. Mild atelectasis bilateral lower lobe posteriorly. Study is limited without IV contrast. No intrahepatic biliary ductal dilatation. The unenhanced pancreas is unremarkable. Unenhanced spleen is unremarkable. Unenhanced kidneys shows bilateral cortical thinning. No adrenal gland mass is noted. There is nonobstructive calcified calculus in  upper pole of the right kidney measures 1.4 cm. No hydronephrosis or hydroureter. No calcified ureteral calculi. The urinary bladder is obscured by extensive metallic artifacts from bilateral hip prosthesis. Oral contrast material was given to the patient. No small bowel obstruction. No gastric outlet obstruction. Moderate stool noted within cecum. No pericecal inflammation. The terminal  ileum is unremarkable. Some colonic stool noted within transverse colon and descending colon. No distal colonic obstruction. Moderate stool noted within rectum. The rectum measures Extensive atherosclerotic calcifications of abdominal aorta, SMA, celiac trunk, splenic artery, bilateral common iliac arteries. No aortic aneurysm. There is no retroperitoneal adenopathy. No mesenteric adenopathy. There is thickening of the right iliacus muscle just anterior to iliac bone. This is a new finding. Subtle hiatal density material is noted centrally within iliacus muscle. Iliacus muscle measures at least 4.1 by 6 cm. Findings are highly suspicious for intramuscular bleed/hematoma. Study is limited without IV contrast. No destructive bony lesions are noted. Sagittal images of the spine shows extensive degenerative changes thoracolumbar spine. Significant disc space flattening with endplate sclerotic changes vacuum disc phenomenon anterior spurring at L2-L3 level. There is diffuse osteopenia. Degenerative changes bilateral SI joints. Extensive metallic artifacts from bilateral hip prosthesis. Mild anasarca infiltration of subcutaneous fat lateral pelvic wall. IMPRESSION: 1. There is asymmetric prominence of the right iliacus muscle right posterior pelvic sidewall just anterior to iliac bone. Measures at least 4.2 x 6.1 cm. Vague high-density material is noted centrally. Findings are highly suspicious for intramuscular bleed/ hematoma. No free fluid each in noted within pelvis. The study is limited without IV contrast. In coronal image 84 the  intramuscular hematoma measures 7.8 cm cranial caudally by 5.6 cm. 2. Extensive metallic artifacts limits evaluation of the lower pelvis. Bilateral hip prosthesis. Degenerative changes thoracolumbar spine. 3. Stable 8 mm nodule in right base anteriorly. Bilateral small pleural effusion. Bilateral lower lobe posterior atelectasis. 4. Again noted bilateral renal cortical thinning. Right nonobstructive nephrolithiasis. No hydronephrosis or hydroureter. 5. No small bowel obstruction. Moderate stool noted in right colon. There is moderate distension of the rectum with stool up to 6.2 cm suspicious for mild fecal impaction. These results were called by telephone at the time of interpretation on 10/12/2015 at 3:47 pm to Dr. Ileana Roup , who verbally acknowledged these results. Electronically Signed   By: Natasha Mead M.D.   On: 10/12/2015 15:48    Micro Results    Recent Results (from the past 240 hour(s))  MRSA PCR Screening     Status: None   Collection Time: 10/13/15 12:33 AM  Result Value Ref Range Status   MRSA by PCR NEGATIVE NEGATIVE Final    Comment:        The GeneXpert MRSA Assay (FDA approved for NASAL specimens only), is one component of a comprehensive MRSA colonization surveillance program. It is not intended to diagnose MRSA infection nor to guide or monitor treatment for MRSA infections.        Today   Subjective:   [facility] today has no headache,no chest abdominal pain,no new weakness tingling or numbness, feels much better wants to go home today.   Objective:   Blood pressure 120/60, pulse 92, temperature 98.2 F (36.8 C), temperature source Oral, resp. rate 20, height 5\' 5"  (1.651 m), weight 70.4 kg (155 lb 3.3 oz), SpO2 96 %.   Intake/Output Summary (Last 24 hours) at 10/15/15 0927 Last data filed at 10/15/15 0607  Gross per 24 hour  Intake   1120 ml  Output   1010 ml  Net    110 ml    Exam Awake Alert, Oriented x 3, No new F.N deficits, Normal  affect [place].AT,PERRAL Supple Neck,No JVD, No cervical lymphadenopathy appriciated.  Symmetrical Chest wall movement, Good air movement bilaterally, CTAB RRR,No Gallops,Rubs or new Murmurs, No Parasternal Heave +ve B.Sounds, Abd Soft,  Non tender, No organomegaly appriciated, No rebound -guarding or rigidity. No Cyanosis, Clubbing or edema, No new Rash or bruise  Data Review   CBC w Diff: Lab Results  Component Value Date   WBC 4.1 10/14/2015   WBC 7.9 12/03/2013   HGB 8.0* 10/15/2015   HGB 14.2 12/03/2013   HCT 24.0* 10/15/2015   HCT 42.7 12/03/2013   PLT 327 10/14/2015   PLT 214 12/03/2013   LYMPHOPCT 12 10/12/2015   LYMPHOPCT 14.5 12/03/2013   MONOPCT 19 10/12/2015   MONOPCT 11.1 12/03/2013   EOSPCT 1 10/12/2015   EOSPCT 2.0 12/03/2013   BASOPCT 2 10/12/2015   BASOPCT 0.7 12/03/2013    CMP: Lab Results  Component Value Date   NA 137 10/13/2015   NA 137 07/04/2014   K 3.4* 10/13/2015   K 3.3* 07/04/2014   CL 98* 10/13/2015   CL 100 07/04/2014   CO2 29 10/13/2015   CO2 29 07/04/2014   BUN 44* 10/13/2015   BUN 44* 07/04/2014   CREATININE 2.68* 10/13/2015   CREATININE 2.72* 07/04/2014   PROT 6.7 10/12/2015   PROT 7.0 12/02/2013   ALBUMIN 2.7* 10/12/2015   ALBUMIN 3.1* 12/02/2013   BILITOT 0.4 10/12/2015   BILITOT 0.6 12/02/2013   ALKPHOS 59 10/12/2015   ALKPHOS 97 12/02/2013   AST 18 10/12/2015   AST 29 12/02/2013   ALT 11* 10/12/2015   ALT 17 12/02/2013  .   Total Time in preparing paper work, data evaluation and todays exam - 35 minutes  Kimball Appleby M.D on 10/15/2015 at 9:27 AM    Note: This dictation was prepared with Dragon dictation along with smaller phrase technology. Any transcriptional errors that result from this process are unintentional.

## 2015-10-15 NOTE — Progress Notes (Signed)
Patient in dialysis between 1900-2350; Tolerated well; voiced n/c's; VSS; Windy Carinaurner,Yaroslav Gombos K, RN 10/15/2015 12:11 AM

## 2015-10-16 LAB — HEPATITIS B SURFACE ANTIGEN: HEP B S AG: NEGATIVE

## 2015-10-17 LAB — TYPE AND SCREEN
ABO/RH(D): O POS
ANTIBODY SCREEN: NEGATIVE
UNIT DIVISION: 0

## 2015-10-21 ENCOUNTER — Encounter: Payer: Medicare Other | Admitting: Surgery

## 2015-10-21 DIAGNOSIS — L97319 Non-pressure chronic ulcer of right ankle with unspecified severity: Secondary | ICD-10-CM | POA: Diagnosis not present

## 2015-10-21 DIAGNOSIS — E1152 Type 2 diabetes mellitus with diabetic peripheral angiopathy with gangrene: Secondary | ICD-10-CM | POA: Diagnosis not present

## 2015-10-21 DIAGNOSIS — X58XXXA Exposure to other specified factors, initial encounter: Secondary | ICD-10-CM | POA: Diagnosis not present

## 2015-10-21 DIAGNOSIS — S31104A Unspecified open wound of abdominal wall, left lower quadrant without penetration into peritoneal cavity, initial encounter: Secondary | ICD-10-CM | POA: Diagnosis not present

## 2015-10-21 DIAGNOSIS — I4891 Unspecified atrial fibrillation: Secondary | ICD-10-CM | POA: Diagnosis not present

## 2015-10-21 DIAGNOSIS — L89610 Pressure ulcer of right heel, unstageable: Secondary | ICD-10-CM | POA: Diagnosis not present

## 2015-10-21 DIAGNOSIS — I5033 Acute on chronic diastolic (congestive) heart failure: Secondary | ICD-10-CM | POA: Diagnosis not present

## 2015-10-21 DIAGNOSIS — E1122 Type 2 diabetes mellitus with diabetic chronic kidney disease: Secondary | ICD-10-CM | POA: Diagnosis not present

## 2015-10-21 DIAGNOSIS — I70233 Atherosclerosis of native arteries of right leg with ulceration of ankle: Secondary | ICD-10-CM | POA: Diagnosis not present

## 2015-10-21 DIAGNOSIS — Z794 Long term (current) use of insulin: Secondary | ICD-10-CM | POA: Diagnosis not present

## 2015-10-21 DIAGNOSIS — Z8673 Personal history of transient ischemic attack (TIA), and cerebral infarction without residual deficits: Secondary | ICD-10-CM | POA: Diagnosis not present

## 2015-10-21 DIAGNOSIS — S301XXA Contusion of abdominal wall, initial encounter: Secondary | ICD-10-CM | POA: Diagnosis not present

## 2015-10-21 DIAGNOSIS — J9620 Acute and chronic respiratory failure, unspecified whether with hypoxia or hypercapnia: Secondary | ICD-10-CM | POA: Diagnosis not present

## 2015-10-21 DIAGNOSIS — E11621 Type 2 diabetes mellitus with foot ulcer: Secondary | ICD-10-CM | POA: Diagnosis present

## 2015-10-21 DIAGNOSIS — Z992 Dependence on renal dialysis: Secondary | ICD-10-CM | POA: Diagnosis not present

## 2015-10-21 DIAGNOSIS — N186 End stage renal disease: Secondary | ICD-10-CM | POA: Diagnosis not present

## 2015-10-21 NOTE — Progress Notes (Signed)
[name], Natalie Y. ([id number]) Visit Report for 10/21/2015 Arrival Information Details Patient Name: [name], Natalie Y. Date of Service: 10/21/2015 1:30 PM Medical Record Number: [id number] Patient Account Number: [id number] Date of Birth/Sex: 1933/06/12 (82 [name] Female) Treating RN: Huel CoventryWoody, Kim Primary Care Physician: Rolm GalaGrandis, Heidi Other Clinician: Referring Physician: Rolm GalaGrandis, Heidi Treating Physician/Extender: Rudene ReBritto, Errol Weeks in Treatment: 3 Visit Information History Since Last Visit Added or deleted any medications: No Patient Arrived: Wheel Chair Any new allergies or adverse reactions: No Arrival Time: 13:31 Had a fall or experienced change in No Accompanied By: son activities of daily living that may affect Transfer Assistance: None risk of falls: Patient Identification Verified: Yes Signs or symptoms of abuse/neglect since last No Secondary Verification Process Yes visito Completed: Hospitalized since last visit: No Patient Has Alerts: Yes Has Dressing in Place as Prescribed: Yes Patient Alerts: Patient on Blood Pain Present Now: Yes Thinner DMII Warfarin Electronic Signature(s) Signed: 10/21/2015 3:05:43 PM By: Elliot GurneyWoody, RN, BSN, Kim RN, BSN Entered By: Elliot GurneyWoody, RN, BSN, Kim on 10/21/2015 13:32:19 [name], Natalie Y. ([id number]) -------------------------------------------------------------------------------- Encounter Discharge Information Details Patient Name: [name], Natalie Y. Date of Service: 10/21/2015 1:30 PM Medical Record Number: [id number] Patient Account Number: [id number] Date of Birth/Sex: 1933/06/12 (82 [name] Female) Treating RN: Huel CoventryWoody, Kim Primary Care Physician: Rolm GalaGrandis, Heidi Other Clinician: Referring Physician: Rolm GalaGrandis, Heidi Treating Physician/Extender: Rudene ReBritto, Errol Weeks in Treatment: 3 Encounter Discharge Information Items Schedule Follow-up Appointment: No Medication Reconciliation completed No and provided to Patient/Care [name]: Provided  on Clinical Summary of Care: 10/21/2015 Form Type [facility] Signed: 10/21/2015 2:18:39 PM By: Gwenlyn PerkingMoore, Shelia Entered By: Gwenlyn PerkingMoore, Shelia on 10/21/2015 14:18:39 [name], Natalie Y. ([id number]) -------------------------------------------------------------------------------- Lower Extremity Assessment Details Patient Name: [name], Natalie Y. Date of Service: 10/21/2015 1:30 PM Medical Record Number: [id number] Patient Account Number: [id number] Date of Birth/Sex: 1933/06/12 (82 [name] Female) Treating RN: Huel CoventryWoody, Kim Primary Care Physician: Rolm GalaGrandis, Heidi Other Clinician: Referring Physician: Rolm GalaGrandis, Heidi Treating Physician/Extender: Rudene ReBritto, Errol Weeks in Treatment: 3 Edema Assessment Assessed: [Left: No] [Right: No] E[Left: dema] [Right: :] Calf Left: Right: Point of Measurement: 33 cm From Medial Instep cm 32 cm Ankle Left: Right: Point of Measurement: 10 cm From Medial Instep cm 22 cm Vascular Assessment Pulses: Posterior Tibial Dorsalis Pedis Palpable: [Right:Yes] Extremity colors, hair growth, and conditions: Extremity Color: [Right:Hyperpigmented] Hair Growth on Extremity: [Right:No] Temperature of Extremity: [Right:Cool] Capillary Refill: [Right:> 3 seconds] Toe Nail Assessment Left: Right: Thick: No Discolored: No Deformed: No Improper Length and Hygiene: No Electronic Signature(s) Signed: 10/21/2015 3:05:43 PM By: Elliot GurneyWoody, RN, BSN, Kim RN, BSN Entered By: Elliot GurneyWoody, RN, BSN, Kim on 10/21/2015 13:38:24 [name], Natalie Y. ([id number]) -------------------------------------------------------------------------------- Multi Wound Chart Details Patient Name: [name], [name] Date of Service: 10/21/2015 1:30 PM Medical Record Number: [id number] Patient Account Number: [id number] Date of Birth/Sex: 1933/06/12 (82 [name] 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] Date Acquired: [1:09/02/2015] [2:09/02/2015] [3:09/02/2015] Weeks of Treatment: [1:3] [2:3] [3:3] Wound Status: [1:Open] [2:Open] [3:Open] Pending Amputation on Yes [2:Yes] [3:Yes] Presentation: Measurements L x W x D 0.6x1.2x0.1 [2:4x3.5x0.1] [3:1.2x1.5x0.1] (cm)  Area (cm) : [1:0.565] [2:10.996] [3:1.414] Volume (cm) : [1:0.057] [2:1.1] [3:0.141] % Reduction in Area: [1:34.60%] [2:-125.80%] [3:25.00%] % Reduction in Volume: 33.70% [2:-125.90%] [3:25.00%] Classification: [1:Unclassifiable] [2:Partial Thickness] [3:Unclassifiable] Periwound Skin Texture: No Abnormalities Noted No Abnormalities Noted No Abnormalities Noted Periwound Skin [1:No Abnormalities Noted No Abnormalities Noted No Abnormalities Noted] Moisture: Periwound Skin Color: No Abnormalities Noted No Abnormalities Noted No Abnormalities Noted Tenderness on [1:No] [2:No] [3:No] Wound Number: 4 5 6  Photos: No Photos No Photos No Photos Wound Location: Right Achilles Right, Lateral Lower Leg Left Abdomen - Lower Quadrant Wounding Event: Not Known Not Known Trauma 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 Trauma, Other Date Acquired: 09/02/2015 09/02/2015 06/10/2015 CAMDYNN, MARANTO ([id number]) Weeks of Treatment: 3 3 3  Wound Status: Open Open Open Pending Amputation on Yes Yes No Presentation: Measurements L x W x D 5x6x0.3 9.5x4x0.1 3x9.5x0.1 (cm) Area  (cm) : 23.562 29.845 22.384 Volume (cm) : 7.069 2.985 2.238 % Reduction in Area: -63.00% 65.80% 10.40% % Reduction in Volume: -63.10% 82.90% 10.40% Classification: Full Thickness Without Full Thickness Without Unclassifiable Exposed Support Exposed Support Structures Structures Periwound Skin Texture: No Abnormalities Noted No Abnormalities Noted No Abnormalities Noted Periwound Skin No Abnormalities Noted No Abnormalities Noted No Abnormalities Noted Moisture: Periwound Skin Color: No Abnormalities Noted No Abnormalities Noted No Abnormalities Noted Tenderness on No No No Palpation: Wound Number: 7 N/A N/A Photos: No Photos N/A N/A Wound Location: Distal Abdomen - midline N/A N/A Wounding Event: Trauma N/A N/A Primary Etiology: Trauma, Other N/A N/A Date Acquired: 08/12/2015 N/A N/A Weeks of Treatment: 3 N/A N/A Wound Status: Open N/A N/A Pending Amputation on No N/A N/A Presentation: Measurements L x W x D 3x2.5x0.3 N/A N/A (cm) Area (cm) : 5.89 N/A N/A Volume (cm) : 1.767 N/A N/A % Reduction in Area: -123.20% N/A N/A % Reduction in Volume: -234.70% N/A N/A Classification: Unclassifiable N/A N/A Periwound Skin Texture: No Abnormalities Noted N/A N/A Periwound Skin No Abnormalities Noted N/A N/A Moisture: Periwound Skin Color: No Abnormalities Noted N/A N/A Tenderness on No N/A N/A Palpation: Treatment Notes Electronic Signature(s) Signed: 10/21/2015 3:05:43 PM By: Elliot Gurney, RN, BSN, Kim RN, BSN [place], [place] ([id number]) Entered By: Elliot Gurney, RN, BSN, Kim on 10/21/2015 13:56:49 Natalie Rima ([id number]) -------------------------------------------------------------------------------- Multi-Disciplinary Care Plan Details Patient Name: [name], PRAK. Date of Service: 10/21/2015 1:30 PM Medical Record Number: [id number] Patient Account Number: [id number] Date of Birth/Sex: 05-May-1934 (82 [name] Female) Treating RN: Huel Coventry Primary Care Physician: [name] Other Clinician: Referring Physician: [name] Treating Physician/Extender: Rudene Re in Treatment: 3 Active Inactive Orientation to the Wound Care Program Nursing Diagnoses: Knowledge deficit related to the wound healing center program Goals: Patient/caregiver will verbalize understanding of the Wound Healing Center Program Date Initiated: 09/30/2015 Goal Status: Active Interventions: Provide education on orientation to the wound center Notes: Wound/Skin Impairment Nursing Diagnoses: Knowledge deficit related to ulceration/compromised skin integrity Goals: Ulcer/skin breakdown will have a volume reduction of 30% by week 4 Date Initiated: 09/30/2015 Goal Status: Active Ulcer/skin breakdown will have a volume reduction of 50% by week 8 Date Initiated: 09/30/2015 Goal Status: Active Ulcer/skin breakdown will have a volume reduction of 80% by week 12 Date Initiated: 09/30/2015 Goal Status: Active Ulcer/skin breakdown will heal within 14 weeks Date Initiated: 09/30/2015 Goal Status: Active Interventions: Assess patient/caregiver ability to obtain necessary supplies Assess ulceration(s) every visit [name], [name] ([id number]) Provide education on ulcer and skin care Notes: Electronic Signature(s) Signed: 10/21/2015 3:05:43 PM By: Elliot Gurney,  RN, BSN, Radio producer, BSN Entered By: Elliot Gurney, RN, BSN, Kim on 10/21/2015 13:56:43 Markeeta, Scalf Vinetta [place] ([id number]) -------------------------------------------------------------------------------- Pain Assessment Details Patient Name: BARBERA, PERRITT. Date of Service: 10/21/2015 1:30 PM Medical Record Number: [id number] Patient Account Number: [id number] Date of Birth/Sex: [birth year] (82 [name] Female) Treating RN: Huel Coventry Primary Care Physician: [name] Other Clinician: Referring Physician: [name] Treating Physician/Extender: Rudene Re in Treatment: 3 Active Problems Location of Pain Severity and  Description of Pain Patient Has Paino No Site Locations Rate the pain. Current Pain Level: 5 Worst Pain Level: 10 Least Pain Level: 5 Character of Pain Describe the Pain: Burning, Shooting, Tender Pain Management and Medication Current Pain Management: Electronic Signature(s) Signed: 10/21/2015 3:05:43 PM By: Elliot Gurney, RN, BSN, Kim RN, BSN Entered By: Elliot Gurney, RN, BSN, Kim on 10/21/2015 13:33:04 Natalie Rima ([id number]) -------------------------------------------------------------------------------- Patient/Caregiver Education Details Patient Name: Natalie Rima Date of Service: 10/21/2015 1:30 PM Medical Record Number: [id number] Patient Account Number: [id number] Date of Birth/Gender: 1934/05/08 (82 [name] Female) Treating RN: Huel Coventry Primary Care Physician: [name] Other Clinician: Referring Physician: [name] Treating Physician/Extender: Rudene Re in Treatment: 3 Education Assessment Education Provided To: Patient Education Topics Provided Wound/Skin Impairment: Handouts: Caring for Your Ulcer, Other: wound care as prescribed; do not put neosporin on toes Methods: Demonstration, Explain/Verbal Responses: State content correctly Electronic Signature(s) Signed: 10/21/2015 3:05:43 PM By: Elliot Gurney, RN, BSN, Kim RN, BSN Entered By: Elliot Gurney, RN, BSN, Kim on 10/21/2015 14:19:17 Natalie Rima ([id number]) -------------------------------------------------------------------------------- Wound Assessment Details Patient Name: Natalie Rima. Date of Service: 10/21/2015 1:30 PM Medical Record Number: [id number] Patient Account Number: [id number] Date of Birth/Sex: Oct 30, 1933 (82 [name] Female) Treating RN: Huel Coventry Primary Care Physician: [name] Other Clinician: Referring Physician: [name] Treating Physician/Extender: Rudene Re in Treatment: 3 Wound Status Wound Number: 1 Primary Arterial Insufficiency Ulcer Etiology: Wound  Location: Right Toe Third Wound Open Wounding Event: Not Known Status: Date Acquired: 09/02/2015 Comorbid Arrhythmia, Congestive Heart Weeks Of Treatment: 3 History: Failure, Type II Diabetes Clustered Wound: No Pending Amputation On Presentation Photos Photo Uploaded By: Elliot Gurney, RN, BSN, Kim on 10/21/2015 16:10:42 Wound Measurements Length: (cm) 0.6 Width: (cm) 1.2 Depth: (cm) 0.1 Area: (cm) 0.565 Volume: (cm) 0.057 % Reduction in Area: 34.6% % Reduction in Volume: 33.7% Epithelialization: None Wound Description Classification: Unclassifiable Diabetic Severity [name] [address]): Grade 1 Wound Margin: Flat and Intact Exudate Amount: None Present Foul Odor After Cleansing: No Wound Bed Granulation Amount: None Present (0%) Exposed Structure Necrotic Amount: Large (67-100%) Fascia Exposed: No Necrotic Quality: Eschar Fat Layer Exposed: No Tendon Exposed: No SHELBI, VACCARO ([id number]) Muscle Exposed: No Joint Exposed: No Bone Exposed: No Limited to Skin Breakdown Periwound Skin Texture Texture Color No Abnormalities Noted: No No Abnormalities Noted: No Callus: No Atrophie Blanche: No Crepitus: No Cyanosis: No Excoriation: No Ecchymosis: No Fluctuance: No Erythema: Yes Friable: No Erythema Location: Circumferential Induration: No Hemosiderin Staining: No Localized Edema: Yes Mottled: No Rash: No Pallor: No Scarring: No Rubor: No Moisture Temperature / Pain No Abnormalities Noted: No Tenderness on Palpation: Yes Dry / Scaly: Yes Maceration: No Moist: No Wound Preparation Ulcer Cleansing: Rinsed/Irrigated with Saline Topical Anesthetic Applied: Other: Lidocaine 4%, Treatment Notes Wound #1 (Right Toe Third) 1. Cleansed with: Clean wound with Normal Saline 2. Anesthetic Topical Lidocaine 4% cream to wound bed prior to debridement 4. Dressing Applied: Other dressing (specify in notes) 5. Secondary Dressing Applied Gauze and  Kerlix/Conform Notes Betadine Electronic Signature(s) Signed: 10/21/2015 4:15:15 PM  By: Elliot Gurney, RN, BSN, Kim RN, BSN Previous Signature: 10/21/2015 3:05:43 PM Version By: Elliot Gurney RN, BSN, Kim RN, BSN Entered By: Elliot Gurney, RN, BSN, Kim on 10/21/2015 15:58:05 Wrigley, [place] V[place]ta [place] ([id number]) -------------------------------------------------------------------------------- Wound Assessment Details Patient Name: CARNELIA, OSCAR. Date of Service: 10/21/2015 1:30 PM Medical Record Number: [id number] Patient Account Number: [id number] Date of Birth/Sex: 26-Oct-1933 (82 [name] Female) Treating RN: Huel Coventry Primary Care Physician: [name] Other Clinician: Referring Physician: [name] Treating Physician/Extender: Rudene Re in Treatment: 3 Wound Status Wound Number: 2 Primary Arterial Insufficiency Ulcer Etiology: Wound Location: Right Toe Fourth Wound Open Wounding Event: Not Known Status: Date Acquired: 09/02/2015 Comorbid Arrhythmia, Congestive Heart Weeks Of Treatment: 3 History: Failure, Type II Diabetes Clustered Wound: No Pending Amputation On Presentation Photos Photo Uploaded By: Elliot Gurney, RN, BSN, Kim on 10/21/2015 16:11:10 Wound Measurements Length: (cm) 4 Width: (cm) 3.5 Depth: (cm) 0.1 Area: (cm) 10.996 Volume: (cm) 1.1 % Reduction in Area: -125.8% % Reduction in Volume: -125.9% Epithelialization: None Wound Description Classification: Partial Thickness Foul Odor Af Diabetic Severity [name] Ave): Grade 1 Wound Margin: Flat and Intact Exudate Amount: Medium Exudate Type: Serous Exudate Color: amber ter Cleansing: No Wound Bed Granulation Amount: Small (1-33%) Exposed Structure Granulation Quality: Pink Fascia Exposed: No KADAJAH, KJOS ([id number]) Necrotic Amount: Large (67-100%) Fat Layer Exposed: No Necrotic Quality: Eschar, Adherent Slough Tendon Exposed: No Muscle Exposed: No Joint Exposed: No Bone Exposed: No Limited to Skin  Breakdown Periwound Skin Texture Texture Color No Abnormalities Noted: No No Abnormalities Noted: No Callus: No Atrophie Blanche: No Crepitus: No Cyanosis: No Excoriation: No Ecchymosis: No Fluctuance: No Erythema: Yes Friable: No Erythema Location: Circumferential Induration: No Hemosiderin Staining: No Localized Edema: Yes Mottled: No Rash: No Pallor: No Scarring: No Rubor: No Moisture Temperature / Pain No Abnormalities Noted: No Tenderness on Palpation: Yes Dry / Scaly: No Maceration: Yes Moist: Yes Wound Preparation Ulcer Cleansing: Rinsed/Irrigated with Saline Topical Anesthetic Applied: Other: Lidocaine 4%, Treatment Notes Wound #2 (Right Toe Fourth) 1. Cleansed with: Clean wound with Normal Saline 2. Anesthetic Topical Lidocaine 4% cream to wound bed prior to debridement 4. Dressing Applied: Other dressing (specify in notes) 5. Secondary Dressing Applied Gauze and Kerlix/Conform Notes Betadine Electronic Signature(s) Signed: 10/21/2015 4:15:15 PM By: Elliot Gurney, RN, BSN, Kim RN, BSN Previous Signature: 10/21/2015 3:05:43 PM Version By: Elliot Gurney, RN, BSN, Kim RN, BSN Entered By: Elliot Gurney, RN, BSN, Kim on 10/21/2015 15:58:37 TOCARRA, GASSEN ([id number]) HUBERTA, TOMPKINS ([id number]) -------------------------------------------------------------------------------- Wound Assessment Details Patient Name: KASHENA, NOVITSKI. Date of Service: 10/21/2015 1:30 PM Medical Record Number: [id number] Patient Account Number: [id number] Date of Birth/Sex: 25-Dec-1933 (82 [name] Female) Treating RN: Huel Coventry Primary Care Physician: [name] Other Clinician: Referring Physician: [name] Treating Physician/Extender: Rudene Re in Treatment: 3 Wound Status Wound Number: 3 Primary Arterial Insufficiency Ulcer Etiology: Wound Location: Right Calcaneus Wound Open Wounding Event: Not Known Status: Date Acquired: 09/02/2015 Comorbid Arrhythmia, Congestive  Heart Weeks Of Treatment: 3 History: Failure, Type II Diabetes Clustered Wound: No Pending Amputation On Presentation Photos Photo Uploaded By: Elliot Gurney, RN, BSN, Kim on 10/21/2015 16:11:11 Wound Measurements Length: (cm) 1.2 Width: (cm) 1.5 Depth: (cm) 0.1 Area: (cm) 1.414 Volume: (cm) 0.141 % Reduction in Area: 25% % Reduction in Volume: 25% Epithelialization: None Wound Description Classification: Unclassifiable Diabetic Severity [name] [address]): Grade 1 Wound Margin: Flat and Intact Exudate Amount: None Present Foul Odor After Cleansing: No Wound Bed Granulation Amount: None Present (0%) Exposed Structure Necrotic Amount: Large (67-100%) Fascia Exposed: No Necrotic  Quality: Eschar Fat Layer Exposed: No Tendon Exposed: No TEMECA, SOMMA ([id number]) Muscle Exposed: No Joint Exposed: No Bone Exposed: No Limited to Skin Breakdown Periwound Skin Texture Texture Color No Abnormalities Noted: No No Abnormalities Noted: No Callus: No Atrophie Blanche: No Crepitus: No Cyanosis: No Excoriation: No Ecchymosis: No Fluctuance: No Erythema: Yes Friable: No Erythema Location: Circumferential Induration: No Hemosiderin Staining: No Localized Edema: Yes Mottled: No Rash: No Pallor: No Scarring: No Rubor: No Moisture Temperature / Pain No Abnormalities Noted: No Tenderness on Palpation: Yes Dry / Scaly: Yes Maceration: No Moist: No Wound Preparation Ulcer Cleansing: Rinsed/Irrigated with Saline Topical Anesthetic Applied: Other: lidocaine 4%, Treatment Notes Wound #3 (Right Calcaneus) 1. Cleansed with: Clean wound with Normal Saline 2. Anesthetic Topical Lidocaine 4% cream to wound bed prior to debridement 4. Dressing Applied: Other dressing (specify in notes) 5. Secondary Dressing Applied Gauze and Kerlix/Conform Notes Betadine Electronic Signature(s) Signed: 10/21/2015 4:15:15 PM By: Elliot Gurney, RN, BSN, Kim RN, BSN Previous Signature: 10/21/2015 3:05:43 PM  Version By: Elliot Gurney, RN, BSN, Kim RN, BSN Entered By: Elliot Gurney, RN, BSN, Kim on 10/21/2015 15:58:55 Sana, Tessmer V[place]ta [place] ([id number]) -------------------------------------------------------------------------------- Wound Assessment Details Patient Name: MARYHELEN, LINDLER. Date of Service: 10/21/2015 1:30 PM Medical Record Number: [id number] Patient Account Number: [id number] Date of Birth/Sex: [birth year] (82 [name] Female) Treating RN: Huel Coventry Primary Care Physician: [name] Other Clinician: Referring Physician: [name] Treating Physician/Extender: Rudene Re in Treatment: 3 Wound Status Wound Number: 4 Primary Arterial Insufficiency Ulcer Etiology: Wound Location: Right Achilles Wound Open Wounding Event: Not Known Status: Date Acquired: 09/02/2015 Comorbid Arrhythmia, Congestive Heart Weeks Of Treatment: 3 History: Failure, Type II Diabetes Clustered Wound: No Pending Amputation On Presentation Photos Photo Uploaded By: Elliot Gurney, RN, BSN, Kim on 10/21/2015 16:12:29 Wound Measurements Length: (cm) 5 Width: (cm) 6 Depth: (cm) 0.3 Area: (cm) 23.562 Volume: (cm) 7.069 % Reduction in Area: -63% % Reduction in Volume: -63.1% Epithelialization: None Wound Description Full Thickness Without Classification: Exposed Support Structures Diabetic Severity Grade 1 (Wagner): Wound Margin: Flat and Intact Exudate Amount: Medium Exudate Type: Serous Exudate Color: amber Foul Odor After Cleansing: No Wound Bed Granulation Amount: Small (1-33%) Exposed Structure AMAIAH, CRISTIANO ([id number]) Granulation Quality: Pink Fascia Exposed: No Necrotic Amount: Large (67-100%) Fat Layer Exposed: No Necrotic Quality: Eschar, Adherent Slough Tendon Exposed: No Muscle Exposed: No Joint Exposed: No Bone Exposed: No Limited to Skin Breakdown Periwound Skin Texture Texture Color No Abnormalities Noted: No No Abnormalities Noted: No Callus: No Atrophie Blanche:  No Crepitus: No Cyanosis: No Excoriation: No Ecchymosis: No Fluctuance: No Erythema: Yes Friable: No Erythema Location: Circumferential Induration: No Hemosiderin Staining: No Localized Edema: Yes Mottled: No Rash: No Pallor: No Scarring: No Rubor: No Moisture Temperature / Pain No Abnormalities Noted: No Temperature: Cool/Cold Dry / Scaly: No Tenderness on Palpation: Yes Maceration: No Moist: Yes Wound Preparation Ulcer Cleansing: Rinsed/Irrigated with Saline Topical Anesthetic Applied: Other: lidocaine 4%, Treatment Notes Wound #4 (Right Achilles) 1. Cleansed with: Clean wound with Normal Saline 2. Anesthetic Topical Lidocaine 4% cream to wound bed prior to debridement 4. Dressing Applied: Santyl Ointment 5. Secondary Dressing Applied ABD and Kerlix/Conform Electronic Signature(s) Signed: 10/21/2015 4:15:15 PM By: Elliot Gurney, RN, BSN, Kim RN, BSN Previous Signature: 10/21/2015 3:05:43 PM Version By: Elliot Gurney, RN, BSN, Kim RN, BSN Entered By: Elliot Gurney, RN, BSN, Kim on 10/21/2015 15:59:26 RELENA, IVANCIC ([id number]) CARTINA, BROUSSEAU ([id number]) -------------------------------------------------------------------------------- Wound Assessment Details Patient Name: ARRIE, BORRELLI. Date of Service: 10/21/2015 1:30 PM Medical Record Number:  [id number] Patient Account Number: [id number] Date of Birth/Sex: Oct 27, 1933 (81 [name] Female) Treating RN: Huel Coventry Primary Care Physician: [name] Other Clinician: Referring Physician: [name] Treating Physician/Extender: Rudene Re in Treatment: 3 Wound Status Wound Number: 5 Primary Arterial Insufficiency Ulcer Etiology: Wound Location: Right Lower Leg - Lateral Wound Open Wounding Event: Not Known Status: Date Acquired: 09/02/2015 Comorbid Arrhythmia, Congestive Heart Weeks Of Treatment: 3 History: Failure, Type II Diabetes Clustered Wound: No Pending Amputation On Presentation Photos Photo Uploaded  By: Elliot Gurney, RN, BSN, Kim on 10/21/2015 16:12:29 Wound Measurements Length: (cm) 9.5 Width: (cm) 4 Depth: (cm) 0.1 Area: (cm) 29.845 Volume: (cm) 2.985 % Reduction in Area: 65.8% % Reduction in Volume: 82.9% Epithelialization: Small (1-33%) Wound Description Full Thickness Without Foul Odor Af Classification: Exposed Support Structures Diabetic Severity Grade 1 (Wagner): Wound Margin: Flat and Intact Exudate Amount: Medium Exudate Type: Serous Exudate Color: amber ter Cleansing: No Wound Bed Granulation Amount: None Present (0%) Exposed Structure FUSAKO, TANABE ([id number]) Necrotic Amount: Large (67-100%) Fascia Exposed: No Necrotic Quality: Eschar, Adherent Slough Fat Layer Exposed: No Tendon Exposed: No Muscle Exposed: No Joint Exposed: No Bone Exposed: No Limited to Skin Breakdown Periwound Skin Texture Texture Color No Abnormalities Noted: No No Abnormalities Noted: No Callus: No Atrophie Blanche: No Crepitus: No Cyanosis: No Excoriation: No Ecchymosis: No Fluctuance: No Erythema: Yes Friable: No Erythema Location: Circumferential Induration: No Hemosiderin Staining: No Localized Edema: Yes Mottled: No Rash: No Pallor: No Scarring: No Rubor: No Moisture Temperature / Pain No Abnormalities Noted: No Tenderness on Palpation: Yes Dry / Scaly: Yes Maceration: No Moist: Yes Wound Preparation Ulcer Cleansing: Rinsed/Irrigated with Saline Topical Anesthetic Applied: Other: lidocaine 4%, Electronic Signature(s) Signed: 10/21/2015 4:15:15 PM By: Elliot Gurney, RN, BSN, Kim RN, BSN Previous Signature: 10/21/2015 3:05:43 PM Version By: Elliot Gurney RN, BSN, Kim RN, BSN Entered By: Elliot Gurney, RN, BSN, Kim on 10/21/2015 16:00:05 MARIATERESA, BATRA ([id number]) -------------------------------------------------------------------------------- Wound Assessment Details Patient Name: JANARIA, MCCAMMON. Date of Service: 10/21/2015 1:30 PM Medical Record Number:  [id number] Patient Account Number: [id number] Date of Birth/Sex: 04-07-1934 (82 [name] Female) Treating RN: Huel Coventry Primary Care Physician: [name] Other Clinician: Referring Physician: [name] Treating Physician/Extender: Rudene Re in Treatment: 3 Wound Status Wound Number: 6 Primary Trauma, Other Etiology: Wound Location: Left Abdomen - Lower Quadrant Wound Open Status: Wounding Event: Trauma Comorbid Arrhythmia, Congestive Heart Date Acquired: 06/10/2015 History: Failure, Type II Diabetes Weeks Of Treatment: 3 Clustered Wound: No Photos Photo Uploaded By: Elliot Gurney, RN, BSN, Kim on 10/21/2015 16:12:58 Wound Measurements Length: (cm) 3 Width: (cm) 9.5 Depth: (cm) 0.1 Area: (cm) 22.384 Volume: (cm) 2.238 % Reduction in Area: 10.4% % Reduction in Volume: 10.4% Epithelialization: None Wound Description Classification: Unclassifiable Wound Margin: Flat and Intact Exudate Amount: None Present Foul Odor After Cleansing: No Wound Bed Granulation Amount: None Present (0%) Exposed Structure Necrotic Amount: Large (67-100%) Fascia Exposed: No Necrotic Quality: Eschar Fat Layer Exposed: No Tendon Exposed: No Muscle Exposed: No MEDA, DUDZINSKI ([id number]) Joint Exposed: No Bone Exposed: No Limited to Skin Breakdown Periwound Skin Texture Texture Color No Abnormalities Noted: No No Abnormalities Noted: No Callus: No Atrophie Blanche: No Crepitus: No Cyanosis: No Excoriation: No Ecchymosis: No Fluctuance: No Erythema: Yes Friable: No Erythema Location: Circumferential Induration: No Hemosiderin Staining: No Localized Edema: Yes Mottled: No Rash: No Pallor: No Scarring: No Rubor: No Moisture Temperature / Pain No Abnormalities Noted: No Tenderness on Palpation: Yes Dry / Scaly: Yes Maceration: No Moist: No Wound Preparation Ulcer  Cleansing: Rinsed/Irrigated with Saline Topical Anesthetic Applied: Other: lidocaine 4%, Treatment  Notes Wound #6 (Left Abdomen - Lower Quadrant) 1. Cleansed with: Clean wound with Normal Saline 2. Anesthetic Topical Lidocaine 4% cream to wound bed prior to debridement 4. Dressing Applied: Other dressing (specify in notes) 5. Secondary Dressing Applied Gauze and Kerlix/Conform Notes Betadine Electronic Signature(s) Signed: 10/21/2015 4:15:15 PM By: Elliot Gurney, RN, BSN, Kim RN, BSN Previous Signature: 10/21/2015 3:05:43 PM Version By: Elliot Gurney, RN, BSN, Kim RN, BSN Entered By: Elliot Gurney, RN, BSN, Kim on 10/21/2015 16:02:57 Serenity, Batley Vinetta Bergamo ([id number]) -------------------------------------------------------------------------------- Wound Assessment Details Patient Name: THERASA, LORENZI. Date of Service: 10/21/2015 1:30 PM Medical Record Number: [id number] Patient Account Number: [id number] Date of Birth/Sex: 01-07-1934 (82 [name] Female) Treating RN: Huel Coventry Primary Care Physician: [name] Other Clinician: Referring Physician: [name] Treating Physician/Extender: Rudene Re in Treatment: 3 Wound Status Wound Number: 7 Primary Trauma, Other Etiology: Wound Location: Abdomen - midline - Distal Wound Open Wounding Event: Trauma Status: Date Acquired: 08/12/2015 Comorbid Arrhythmia, Congestive Heart Weeks Of Treatment: 3 History: Failure, Type II Diabetes Clustered Wound: No Photos Photo Uploaded By: Elliot Gurney, RN, BSN, Kim on 10/21/2015 16:12:58 Wound Measurements Length: (cm) 3 Width: (cm) 2.5 Depth: (cm) 0.3 Area: (cm) 5.89 Volume: (cm) 1.767 % Reduction in Area: -123.2% % Reduction in Volume: -234.7% Epithelialization: None Wound Description Classification: Unclassifiable Wound Margin: Flat and Intact Exudate Amount: Medium Exudate Type: Serous Exudate Color: amber Foul Odor After Cleansing: No Wound Bed Granulation Amount: None Present (0%) Exposed Structure Necrotic Amount: Large (67-100%) Fascia Exposed: No Necrotic Quality: Eschar, Adherent  Slough Fat Layer Exposed: No Tendon Exposed: No JACKELYNE, SAYER ([id number]) Muscle Exposed: No Joint Exposed: No Bone Exposed: No Limited to Skin Breakdown Periwound Skin Texture Texture Color No Abnormalities Noted: No No Abnormalities Noted: No Callus: No Atrophie Blanche: No Crepitus: No Cyanosis: No Excoriation: No Ecchymosis: No Fluctuance: No Erythema: Yes Friable: No Erythema Location: Circumferential Induration: No Hemosiderin Staining: No Localized Edema: No Mottled: No Rash: No Pallor: No Scarring: No Rubor: No Moisture Temperature / Pain No Abnormalities Noted: No Tenderness on Palpation: Yes Dry / Scaly: No Maceration: No Moist: Yes Wound Preparation Ulcer Cleansing: Rinsed/Irrigated with Saline Topical Anesthetic Applied: Other: lidocaine 4%, Treatment Notes Wound #7 (Distal Abdomen - midline) 1. Cleansed with: Clean wound with Normal Saline 2. Anesthetic Topical Lidocaine 4% cream to wound bed prior to debridement 4. Dressing Applied: Santyl Ointment 5. Secondary Dressing Applied ABD and Kerlix/Conform Electronic Signature(s) Signed: 10/21/2015 4:15:15 PM By: Elliot Gurney, RN, BSN, Kim RN, BSN Previous Signature: 10/21/2015 3:05:43 PM Version By: Elliot Gurney, RN, BSN, Kim RN, BSN Entered By: Elliot Gurney, RN, BSN, Kim on 10/21/2015 16:03:29 Matha, Masse Vinetta Bergamo ([id number]) -------------------------------------------------------------------------------- Vitals Details Patient Name: Natalie Rima Date of Service: 10/21/2015 1:30 PM Medical Record Number: [id number] Patient Account Number: [id number] Date of Birth/Sex: Oct 09, 1933 (82 [name] Female) Treating RN: Huel Coventry Primary Care Physician: [name] Other Clinician: Referring Physician: [name] Treating Physician/Extender: Rudene Re in Treatment: 3 Vital Signs Time Taken: 13:33 Temperature (F): 97.5 Height (in): 65 Pulse (bpm): 76 Weight (lbs): 159 Respiratory Rate  (breaths/min): 18 Body Mass Index (BMI): 26.5 Blood Pressure (mmHg): 118/51 Reference Range: 80 - 120 mg / dl Electronic Signature(s) Signed: 10/21/2015 3:05:43 PM By: Elliot Gurney, RN, BSN, Kim RN, BSN Entered By: Elliot Gurney, RN, BSN, Kim on 10/21/2015 13:33:23

## 2015-10-21 NOTE — Progress Notes (Addendum)
MAMYE, BOLDS ([id number]) Visit Report for 10/21/2015 Chief Complaint Document Details Patient Name: [name], [name]. Date of Service: 10/21/2015 1:30 PM Medical Record Number: [id number] Patient Account Number: [id number] Date of Birth/Sex: 05/15/1934 ([name]) Treating RN: Huel Coventry Primary Care Physician: [name] Other Clinician: Referring Physician: [name] Treating Physician/Extender: Rudene Re in Treatment: 3 Information Obtained from: Patient Chief Complaint Patients presents for treatment of an open diabetic ulcer, arterial ulcer and pressure ulcers to the right lower extremities which is a complex etiology for the last several months. He also has ulcers on her abdomen in the left lower quadrant and left suprapubic area which she's had for about 4 months. Electronic Signature(s) Signed: 10/21/2015 2:29:07 PM By: Evlyn Kanner MD, FACS Entered By: Evlyn Kanner on 10/21/2015 14:29:07 [name] ([id number]) -------------------------------------------------------------------------------- Debridement Details Patient Name: [name] Date of Service: 10/21/2015 1:30 PM Medical Record Number: [id number] Patient Account Number: [id number] Date of Birth/Sex: 12/18/1933 ([name]) Treating RN: Huel Coventry Primary Care Physician: [name] Other Clinician: Referring Physician: [name] Treating Physician/Extender: Rudene Re in Treatment: 3 Debridement Performed for Wound #5 Right,Lateral Lower Leg Assessment: Performed By: Physician Evlyn Kanner, MD Debridement: Debridement Pre-procedure Yes Verification/Time Out Taken: Start Time: 13:55 Pain Control: Other : lidoicaine 4% Level: Skin/Subcutaneous Tissue Total Area Debrided (L x 3 (cm) x 4 (cm) = 12 (cm) W): Tissue and other Viable, Non-Viable, Exudate, Fibrin/Slough, Subcutaneous material debrided: Instrument: Forceps, Scissors Bleeding:  None End Time: 13:59 Procedural Pain: 2 Post Procedural Pain: 2 Response to Treatment: Procedure was tolerated well Post Debridement Measurements of Total Wound Length: (cm) 9.5 Width: (cm) 4 Depth: (cm) 0.2 Volume: (cm) 5.969 Post Procedure Diagnosis Same as Pre-procedure Electronic Signature(s) Signed: 10/21/2015 2:29:00 PM By: Evlyn Kanner MD, FACS Signed: 10/21/2015 3:05:43 PM By: Elliot Gurney RN, BSN, Kim RN, BSN Entered By: Evlyn Kanner on 10/21/2015 14:29:00 [name] ([id number]) -------------------------------------------------------------------------------- Debridement Details Patient Name: [name]. Date of Service: 10/21/2015 1:30 PM Medical Record Number: [id number] Patient Account Number: [id number] Date of Birth/Sex: [birth year] ([name]) Treating RN: Huel Coventry Primary Care Physician: [name] Other Clinician: Referring Physician: [name] Treating Physician/Extender: Rudene Re in Treatment: 3 Debridement Performed for Wound #4 Right Achilles Assessment: Performed By: Physician Evlyn Kanner, MD Debridement: Debridement Pre-procedure Yes Verification/Time Out Taken: Start Time: 13:55 Pain Control: Other : lidoicaine 4% Level: Skin/Subcutaneous Tissue Total Area Debrided (L x 3 (cm) x 3 (cm) = 9 (cm) W): Tissue and other Viable, Non-Viable, Exudate, Fibrin/Slough, Subcutaneous material debrided: Instrument: Forceps, Scissors Bleeding: None End Time: 13:59 Procedural Pain: 2 Post Procedural Pain: 2 Response to Treatment: Procedure was tolerated well Post Debridement Measurements of Total Wound Length: (cm) 5 Width: (cm) 6 Depth: (cm) 0.3 Volume: (cm) 7.069 Post Procedure Diagnosis Same as Pre-procedure Electronic Signature(s) Signed: 10/21/2015 2:32:23 PM By: Evlyn Kanner MD, FACS Signed: 10/21/2015 3:05:43 PM By: Elliot Gurney RN, BSN, Kim RN, BSN Previous Signature: 10/21/2015 2:28:35 PM Version By: Evlyn Kanner MD, FACS Entered By: Evlyn Kanner on 10/21/2015 14:32:23 [name] ([id number]) -------------------------------------------------------------------------------- HPI Details Patient Name: [name], [name]. Date of Service: 10/21/2015 1:30 PM Medical Record Number: [id number] Patient Account Number: [id number] Date of Birth/Sex: 07-Oct-1933 ([name]) Treating RN: Huel Coventry Primary Care Physician: [name] Other Clinician: Referring Physician: [name] Treating Physician/Extender: Rudene Re in Treatment: 3 History of Present Illness Location: several wounds on the right lower extremity including her right healed right  posterior ankle and right lower third of the leg. She also has wounds on her left lower quadrant of the abdomen and the suprapubic area. Quality: Patient reports experiencing a sharp pain to affected area(s). Severity: Patient states wound are getting worse. Duration: Patient has had the wound for > 3 months prior to seeking treatment at the wound center Timing: Pain in wound is Intermittent (comes and goes Context: The wound appeared gradually over time Modifying Factors: Other treatment(s) tried include:he simply been admitted to the hospital 2 weeks ago and has had procedures done on her right lower extremity and had a blockage which we are trying to get some notes Associated Signs and Symptoms: Patient reports having increase discharge. HPI Description: 82 year old patient who is known to be diabetic, was referred to [place] by Dr. Gavin Potters for a right heel ulceration which she's had for a while. She was recently in hospital for a pneumonia and at that time and got delirious and was disoriented and sometime during this time developed a stage II ulcer on her right heel. Her past medical history is significant for bilateral pneumonia which was treated with injectable antibiotics and then to oral Levaquin which he has completed. She also  has acute on chronic diastolic CHF, acute on chronic respiratory failure, end-stage renal disease on hemodialysis, atrial fibrillation, recent stroke, diabetes mellitus. The patient and her son are poor historians but from what I understand she was admitted to the hospital with an acute vascular compromise of her right lower extremity and Dr. Wyn Quaker has done a surgical procedure and we are trying to obtain these notes. There are also some vascular workup done and we will try and obtain these notes. the injury to the left lower quadrant of abdomen and the suprapubic area have been there due to a bruise and have been there for several months and no intervention has been done. 10/11/2015 -- on review of the electronics records it was noted that the patient was admitted to the hospital on 09/14/2015 with peripheral vascular disease with claudication, end-stage renal disease, pressure ulcer, chronic atrial fibrillation. She was seen by Dr. Wyn Quaker who did her right lower extremity angiogram , angioplasty of the right anterior tibial artery and thrombolysis with TPA of the right popliteal artery, and thrombectomy. She was seen by Dr. Wyn Quaker during this past week and he was pleased with the progress. He did say that if he took her to the operating room for any procedure he would debride the abdominal wound under anesthesia. She was also seen by Dr. Ether Griffins the podiatrist who thought that she may lose her right fourth toe at some stage may need an amputation of this. 10/21/2015 --patient known to Dr. Wyn Quaker and his last office visit from 10/04/2015 has been reviewed. 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 She also had a persistent ulceration of the right heel and [name], [name]. ([id number]) markedly this area and a right third and fourth toe and a small scab on the calf but these are dry  and seemed to be improving. Patient also has a left carotid endarterectomy and multiple interventions to a right brachiocephalic AV fistula. After the visit he had recommended noninvasive studies to recheck her revascularization. He was off the impression that she would likely lose the right fourth toe and the third toe was likely to heal. He was concerned about underlying muscle necrosis on her  right heel and midfoot. Electronic Signature(s) Signed: 10/21/2015 2:29:18 PM By: Evlyn Kanner MD, FACS Previous Signature: 10/21/2015 1:39:55 PM Version By: Evlyn Kanner MD, FACS Entered By: Evlyn Kanner on 10/21/2015 14:29:18 [name] ([id number]) -------------------------------------------------------------------------------- Physical Exam Details Patient Name: [name] Date of Service: 10/21/2015 1:30 PM Medical Record Number: [id number] Patient Account Number: [id number] Date of Birth/Sex: 12/15/1933 ([name]) Treating RN: Huel Coventry Primary Care Physician: [name] Other Clinician: Referring Physician: [name] Treating Physician/Extender: Rudene Re in Treatment: 3 Constitutional . Pulse regular. Respirations normal and unlabored. Afebrile. . Eyes Nonicteric. Reactive to light. Ears, Nose, Mouth, and Throat Lips, teeth, and gums WNL.Marland Kitchen Moist mucosa without lesions. Neck supple and nontender. No palpable supraclavicular or cervical adenopathy. Normal sized without goiter. Respiratory WNL. No retractions.. Cardiovascular Pedal Pulses WNL. No clubbing, cyanosis or edema. Lymphatic No adneopathy. No adenopathy. No adenopathy. Musculoskeletal Adexa without tenderness or enlargement.. Digits and nails w/o clubbing, cyanosis, infection, petechiae, ischemia, or inflammatory conditions.. Integumentary (Hair, Skin) No suspicious lesions. No crepitus or fluctuance. No peri-wound warmth or erythema. No masses.Marland Kitchen Psychiatric Judgement and insight  Intact.. No evidence of depression, anxiety, or agitation.. Notes the right Achilles tendon continues to have significant amount of debris and some of it was sharply removed without much bleeding. The tip of the third toe had some debris which I was able to sharply removed with forceps and scissors and there is no bleeding. The fourth toe has dry gangrene. The right lower extremity has several areas where sharply I was able to remove some eschar and debris with no bleeding. Electronic Signature(s) Signed: 10/21/2015 2:30:25 PM By: Evlyn Kanner MD, FACS Entered By: Evlyn Kanner on 10/21/2015 14:30:24 [name] ([id number]) -------------------------------------------------------------------------------- Physician Orders Details Patient Name: [name] Date of Service: 10/21/2015 1:30 PM Medical Record Number: [id number] Patient Account Number: [id number] Date of Birth/Sex: Jul 27, 1933 ([name]) Treating RN: Huel Coventry Primary Care Physician: [name] Other Clinician: Referring Physician: [name] Treating Physician/Extender: Rudene Re in Treatment: 3 Verbal / Phone Orders: Yes Clinician: Huel Coventry Read Back and Verified: Yes Diagnosis Coding Wound Cleansing Wound #1 Right Toe Third o Cleanse wound with mild soap and water o May Shower, gently pat wound dry prior to applying new dressing. Anesthetic Wound #1 Right Toe Third o Topical Lidocaine 4% cream applied to wound bed prior to debridement Wound #2 Right Toe Fourth o Topical Lidocaine 4% cream applied to wound bed prior to debridement Wound #3 Right Calcaneus o Topical Lidocaine 4% cream applied to wound bed prior to debridement Wound #4 Right Achilles o Topical Lidocaine 4% cream applied to wound bed prior to debridement Wound #5 Right,Lateral Lower Leg o Topical Lidocaine 4% cream applied to wound bed prior to debridement Wound #6 Left Abdomen - Lower Quadrant o  Topical Lidocaine 4% cream applied to wound bed prior to debridement Wound #7 Distal Abdomen - midline o Topical Lidocaine 4% cream applied to wound bed prior to debridement Primary Wound Dressing Wound #1 Right Toe Third o Other: - Betadine paint Wound #2 Right Toe Fourth o Other: - Betadine paint Wound #3 Right Calcaneus o Other: - Betadine paint LLEWELLYN, SCHOENBERGER ([id number]) Wound #6 Left Abdomen - Lower Quadrant o Other: - Betadine paint Wound #4 Right Achilles o Other: - Betadine paint Wound #5 Right,Lateral Lower Leg o Other: - Betadine paint Wound #7 Distal Abdomen - midline o Other: - Betadine paint Secondary Dressing Wound #1 Right Toe Third o  Gauze and Kerlix/Conform Wound #2 Right Toe Fourth o Gauze and Kerlix/Conform Wound #3 Right Calcaneus o Gauze and Kerlix/Conform Wound #4 Right Achilles o Gauze and Kerlix/Conform Wound #5 Right,Lateral Lower Leg o Gauze and Kerlix/Conform Wound #6 Left Abdomen - Lower Quadrant o Boardered Foam Dressing Wound #7 Distal Abdomen - midline o Boardered Foam Dressing Dressing Change Frequency Wound #1 Right Toe Third o Change dressing every day. Wound #2 Right Toe Fourth o Change dressing every day. Wound #3 Right Calcaneus o Change dressing every day. Wound #4 Right Achilles o Change dressing every day. Wound #5 Right,Lateral Lower Leg [name], [name] ([id number]) o Change dressing every day. Wound #6 Left Abdomen - Lower Quadrant o Change dressing every day. Wound #7 Distal Abdomen - midline o Change dressing every day. Follow-up Appointments Wound #1 Right Toe Third o Return Appointment in 1 week. Wound #2 Right Toe Fourth o Return Appointment in 1 week. Wound #3 Right Calcaneus o Return Appointment in 1 week. Wound #4 Right Achilles o Return Appointment in 1 week. Wound #5 Right,Lateral Lower Leg o Return Appointment in 1 week. Wound #6 Left Abdomen -  Lower Quadrant o Return Appointment in 1 week. Wound #7 Distal Abdomen - midline o Return Appointment in 1 week. Additional Orders / Instructions Wound #1 Right Toe Third o Increase protein intake. o Activity as tolerated Wound #2 Right Toe Fourth o Increase protein intake. o Activity as tolerated Wound #3 Right Calcaneus o Increase protein intake. o Activity as tolerated Wound #4 Right Achilles o Increase protein intake. o Activity as tolerated Wound #5 Right,Lateral Lower Leg o Increase protein intake. [name], [name] ([id number]) o Activity as tolerated Wound #6 Left Abdomen - Lower Quadrant o Increase protein intake. o Activity as tolerated Wound #7 Distal Abdomen - midline o Increase protein intake. o Activity as tolerated Electronic Signature(s) Signed: 10/21/2015 3:05:43 PM By: Elliot Gurney RN, BSN, Kim RN, BSN Signed: 10/21/2015 3:39:56 PM By: Evlyn Kanner MD, FACS Entered By: Elliot Gurney RN, BSN, Kim on 10/21/2015 14:03:11 [name], [name] ([id number]) -------------------------------------------------------------------------------- Problem List Details Patient Name: [name], [name]. Date of Service: 10/21/2015 1:30 PM Medical Record Number: [id number] Patient Account Number: [id number] Date of Birth/Sex: 06-28-1933 (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 S30.1XXA Contusion of abdominal wall, initial encounter 09/30/2015 Yes S31.104A Unspecified open wound of  abdominal wall, left lower 09/30/2015 Yes quadrant without penetration into peritoneal cavity, initial encounter L89.610 Pressure ulcer of right heel, unstageable 09/30/2015 Yes Inactive Problems Resolved Problems Electronic Signature(s) Signed: 10/21/2015 2:28:14 PM By: Evlyn Kanner MD, FACS Entered By: Evlyn Kanner on 10/21/2015 14:28:14 [name] ([id number]) -------------------------------------------------------------------------------- Progress Note Details Patient Name: [name] Date of Service: 10/21/2015 1:30 PM Medical Record Number: [id number] Patient Account Number: [id number] Date of Birth/Sex: 06-17-1933 ([name]) Treating RN: Huel Coventry Primary Care Physician: [name] Other Clinician: Referring Physician: [name] Treating Physician/Extender: Rudene Re in Treatment: 3 Subjective Chief Complaint Information obtained from Patient Patients presents for treatment of an open diabetic ulcer, arterial ulcer and pressure ulcers to the right lower extremities which is a complex etiology for  the last several months. He also has ulcers on her abdomen in the left lower quadrant and left suprapubic area which she's had for about 4 months. History of Present Illness (HPI) The following HPI elements were documented for the patient's wound: Location: several wounds on the right lower extremity including her right healed right posterior ankle and right lower third of the leg. She also has wounds on her left lower quadrant of the abdomen and the suprapubic area. Quality: Patient reports experiencing a sharp pain to affected area(s). Severity: Patient states wound are getting worse. Duration: Patient has had the wound for > 3 months prior to seeking treatment at the wound center Timing: Pain in wound is Intermittent (comes and goes Context: The wound appeared gradually over time Modifying Factors: Other treatment(s) tried include:he simply  been admitted to the hospital 2 weeks ago and has had procedures done on her right lower extremity and had a blockage which we are trying to get some notes Associated Signs and Symptoms: Patient reports having increase discharge. 82 year old patient who is known to be diabetic, was referred to [place] by Dr. Gavin Potters for a right heel ulceration which she's had for a while. She was recently in hospital for a pneumonia and at that time and got delirious and was disoriented and sometime during this time developed a stage II ulcer on her right heel. Her past medical history is significant for bilateral pneumonia which was treated with injectable antibiotics and then to oral Levaquin which he has completed. She also has acute on chronic diastolic CHF, acute on chronic respiratory failure, end-stage renal disease on hemodialysis, atrial fibrillation, recent stroke, diabetes mellitus. The patient and her son are poor historians but from what I understand she was admitted to the hospital with an acute vascular compromise of her right lower extremity and Dr. Wyn Quaker has done a surgical procedure and we are trying to obtain these notes. There are also some vascular workup done and we will try and obtain these notes. the injury to the left lower quadrant of abdomen and the suprapubic area have been there due to a bruise and have been there for several months and no intervention has been done. 10/11/2015 -- on review of the [facility] records it was noted that the patient was admitted to the hospital on 09/14/2015 with peripheral vascular disease with claudication, end-stage renal disease, pressure ulcer, chronic atrial fibrillation. She was seen by Dr. Wyn Quaker who did her right lower extremity angiogram , [name], [name] ([id number]) angioplasty of the right anterior tibial artery and thrombolysis with TPA of the right popliteal artery, and thrombectomy. She was seen by Dr. Wyn Quaker during this past week and he was  pleased with the progress. He did say that if he took her to the operating room for any procedure he would debride the abdominal wound under anesthesia. She was also seen by Dr. Ether Griffins the podiatrist who thought that she may lose her right fourth toe at some stage may need an amputation of this. 10/21/2015 --patient known to Dr. Wyn Quaker and his last office visit from 10/04/2015 has been reviewed. 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 She also had a persistent ulceration of the right heel and markedly this area and a right third and fourth toe and a small scab on the calf but these are dry and seemed to be improving. Patient also has a left carotid  endarterectomy and multiple interventions to a right brachiocephalic AV fistula. After the visit he had recommended noninvasive studies to recheck her revascularization. He was off the impression that she would likely lose the right fourth toe and the third toe was likely to heal. He was concerned about underlying muscle necrosis on her right heel and midfoot. Objective Constitutional Pulse regular. Respirations normal and unlabored. Afebrile. Vitals Time Taken: 1:33 PM, Height: 65 in, Weight: 159 lbs, BMI: 26.5, Temperature: 97.5 F, Pulse: 76 bpm, Respiratory Rate: 18 breaths/min, Blood Pressure: 118/51 mmHg. Eyes Nonicteric. Reactive to light. Ears, Nose, Mouth, and Throat Lips, teeth, and gums WNL.. Moist mucosa without lesions. NecMarland Kitchenk supple and nontender. No palpable supraclavicular or cervical adenopathy. Normal sized without goiter. Respiratory WNL. No retractions.. Cardiovascular Pedal Pulses WNL. No clubbing, cyanosis or edema. Lymphatic No adneopathy. No adenopathy. No adenopathy. Musculoskeletal Natalie RimaHOPKINS, Nawaal Y. ([id number]) Adexa without tenderness or enlargement.. Digits and nails w/o clubbing, cyanosis,  infection, petechiae, ischemia, or inflammatory conditions.Marland Kitchen. Psychiatric Judgement and insight Intact.. No evidence of depression, anxiety, or agitation.. General Notes: the right Achilles tendon continues to have significant amount of debris and some of it was sharply removed without much bleeding. The tip of the third toe had some debris which I was able to sharply removed with forceps and scissors and there is no bleeding. The fourth toe has dry gangrene. The right lower extremity has several areas where sharply I was able to remove some eschar and debris with no bleeding. Integumentary (Hair, Skin) No suspicious lesions. No crepitus or fluctuance. No peri-wound warmth or erythema. No masses.. Wound #1 status is Open. Original cause of wound was Not Known. The wound is located on the Right Toe Third. The wound measures 0.6cm length x 1.2cm width x 0.1cm depth; 0.565cm^2 area and 0.057cm^3 volume. The wound is limited to skin breakdown. There is a none present amount of drainage noted. The wound margin is flat and intact. There is no granulation within the wound bed. There is a large (67-100%) amount of necrotic tissue within the wound bed including Eschar. The periwound skin appearance exhibited: Localized Edema, Dry/Scaly, Erythema. The periwound skin appearance did not exhibit: Callus, Crepitus, Excoriation, Fluctuance, Friable, Induration, Rash, Scarring, Maceration, Moist, Atrophie Blanche, Cyanosis, Ecchymosis, Hemosiderin Staining, Mottled, Pallor, Rubor. The surrounding wound skin color is noted with erythema which is circumferential. The periwound has tenderness on palpation. Wound #2 status is Open. Original cause of wound was Not Known. The wound is located on the Right Toe Fourth. The wound measures 4cm length x 3.5cm width x 0.1cm depth; 10.996cm^2 area and 1.1cm^3 volume. The wound is limited to skin breakdown. There is a medium amount of serous drainage noted. The wound margin  is flat and intact. There is small (1-33%) pink granulation within the wound bed. There is a large (67-100%) amount of necrotic tissue within the wound bed including Eschar and Adherent Slough. The periwound skin appearance exhibited: Localized Edema, Maceration, Moist, Erythema. The periwound skin appearance did not exhibit: Callus, Crepitus, Excoriation, Fluctuance, Friable, Induration, Rash, Scarring, Dry/Scaly, Atrophie Blanche, Cyanosis, Ecchymosis, Hemosiderin Staining, Mottled, Pallor, Rubor. The surrounding wound skin color is noted with erythema which is circumferential. The periwound has tenderness on palpation. Wound #3 status is Open. Original cause of wound was Not Known. The wound is located on the Right Calcaneus. The wound measures 1.2cm length x 1.5cm width x 0.1cm depth; 1.414cm^2 area and 0.141cm^3 volume. The wound is limited to skin breakdown. There is a none present amount  of drainage noted. The wound margin is flat and intact. There is no granulation within the wound bed. There is a large (67-100%) amount of necrotic tissue within the wound bed including Eschar. The periwound skin appearance exhibited: Localized Edema, Dry/Scaly, Erythema. The periwound skin appearance did not exhibit: Callus, Crepitus, Excoriation, Fluctuance, Friable, Induration, Rash, Scarring, Maceration, Moist, Atrophie Blanche, Cyanosis, Ecchymosis, Hemosiderin Staining, Mottled, Pallor, Rubor. The surrounding wound skin color is noted with erythema which is circumferential. The periwound has tenderness on palpation. Wound #4 status is Open. Original cause of wound was Not Known. The wound is located on the Right Achilles. The wound measures 5cm length x 6cm width x 0.3cm depth; 23.562cm^2 area and 7.069cm^3 volume. The wound is limited to skin breakdown. There is a medium amount of serous drainage noted. The [name], [name] ([id number]) wound margin is flat and intact. There is small (1-33%) pink  granulation within the wound bed. There is a large (67-100%) amount of necrotic tissue within the wound bed including Eschar and Adherent Slough. The periwound skin appearance exhibited: Localized Edema, Moist, Erythema. The periwound skin appearance did not exhibit: Callus, Crepitus, Excoriation, Fluctuance, Friable, Induration, Rash, Scarring, Dry/Scaly, Maceration, Atrophie Blanche, Cyanosis, Ecchymosis, Hemosiderin Staining, Mottled, Pallor, Rubor. The surrounding wound skin color is noted with erythema which is circumferential. Periwound temperature was noted as Cool/Cold. The periwound has tenderness on palpation. Wound #5 status is Open. Original cause of wound was Not Known. The wound is located on the Right,Lateral Lower Leg. The wound measures 9.5cm length x 4cm width x 0.1cm depth; 29.845cm^2 area and 2.985cm^3 volume. The wound is limited to skin breakdown. There is a medium amount of serous drainage noted. The wound margin is flat and intact. There is no granulation within the wound bed. There is a large (67-100%) amount of necrotic tissue within the wound bed including Eschar and Adherent Slough. The periwound skin appearance exhibited: Localized Edema, Dry/Scaly, Moist, Erythema. The periwound skin appearance did not exhibit: Callus, Crepitus, Excoriation, Fluctuance, Friable, Induration, Rash, Scarring, Maceration, Atrophie Blanche, Cyanosis, Ecchymosis, Hemosiderin Staining, Mottled, Pallor, Rubor. The surrounding wound skin color is noted with erythema which is circumferential. The periwound has tenderness on palpation. Wound #6 status is Open. Original cause of wound was Trauma. The wound is located on the Left Abdomen - Lower Quadrant. The wound measures 3cm length x 9.5cm width x 0.1cm depth; 22.384cm^2 area and 2.238cm^3 volume. The wound is limited to skin breakdown. There is a none present amount of drainage noted. The wound margin is flat and intact. There is no  granulation within the wound bed. There is a large (67-100%) amount of necrotic tissue within the wound bed including Eschar. The periwound skin appearance exhibited: Localized Edema, Dry/Scaly, Erythema. The periwound skin appearance did not exhibit: Callus, Crepitus, Excoriation, Fluctuance, Friable, Induration, Rash, Scarring, Maceration, Moist, Atrophie Blanche, Cyanosis, Ecchymosis, Hemosiderin Staining, Mottled, Pallor, Rubor. The surrounding wound skin color is noted with erythema which is circumferential. The periwound has tenderness on palpation. Wound #7 status is Open. Original cause of wound was Trauma. The wound is located on the Distal Abdomen - midline. The wound measures 3cm length x 2.5cm width x 0.3cm depth; 5.89cm^2 area and 1.767cm^3 volume. The wound is limited to skin breakdown. There is a medium amount of serous drainage noted. The wound margin is flat and intact. There is no granulation within the wound bed. There is a large (67-100%) amount of necrotic tissue within the wound bed including Eschar and Adherent Slough.  The periwound skin appearance exhibited: Moist, Erythema. The periwound skin appearance did not exhibit: Callus, Crepitus, Excoriation, Fluctuance, Friable, Induration, Localized Edema, Rash, Scarring, Dry/Scaly, Maceration, Atrophie Blanche, Cyanosis, Ecchymosis, Hemosiderin Staining, Mottled, Pallor, Rubor. The surrounding wound skin color is noted with erythema which is circumferential. The periwound has tenderness on palpation. 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 There was a Skin/Subcutaneous Tissue Debridement ([id number]) debridement with total area of 9 sq cm performed by Evlyn Kanner, MD. with the following instrument(s): Forceps and Scissors to remove Viable and Non-Viable tissue/material including Exudate, Fibrin/Slough, and Subcutaneous after achieving pain control using Other (lidoicaine 4%). A time out was conducted prior to the start of the procedure. There was no bleeding. The procedure was tolerated well with a pain level of 2 throughout and a pain level of 2 following the procedure. Post Debridement Measurements: 5cm length x 6cm width x 0.3cm depth; 7.069cm^3 volume. Post procedure Diagnosis Wound #4: Same as Pre-Procedure Wound #5 Wound #5 is an Arterial Insufficiency Ulcer located on the Right,Lateral Lower Leg . There was a Skin/Subcutaneous Tissue Debridement ([id number]) debridement with total area of 12 sq cm performed by Evlyn Kanner, MD. with the following instrument(s): Forceps and Scissors to remove Viable and Non- Viable tissue/material including Exudate, Fibrin/Slough, and Subcutaneous after achieving pain control using Other (lidoicaine 4%). A time out was conducted prior to the start of the procedure. There was no bleeding. The procedure was tolerated well with a pain level of 2 throughout and a pain level of 2 following the procedure. Post Debridement Measurements: 9.5cm length x 4cm width x 0.2cm depth; 5.969cm^3 volume. Post procedure Diagnosis Wound #5: Same as Pre-Procedure Plan Wound Cleansing: Wound #1 Right Toe Third: Cleanse wound with mild soap and water May Shower, gently pat wound dry prior to applying new dressing. Anesthetic: Wound #1 Right Toe Third: Topical Lidocaine 4% cream applied to wound bed  prior to debridement CHANTI, GOLUBSKI ([id number]) Wound #2 Right Toe Fourth: Topical Lidocaine 4% cream applied to wound bed prior to debridement Wound #3 Right Calcaneus: Topical Lidocaine 4% cream applied to wound bed prior to debridement Wound #4 Right Achilles: Topical Lidocaine 4% cream applied to wound bed prior to debridement Wound #5 Right,Lateral Lower Leg: Topical Lidocaine 4% cream applied to wound bed prior to debridement Wound #6 Left Abdomen - Lower Quadrant: Topical Lidocaine 4% cream applied to wound bed prior to debridement Wound #7 Distal Abdomen - midline: Topical Lidocaine 4% cream applied to wound bed prior to debridement Primary Wound Dressing: Wound #1 Right Toe Third: Other: - Betadine paint Wound #2 Right Toe Fourth: Other: - Betadine paint Wound #3 Right Calcaneus: Other: - Betadine paint Wound #6 Left Abdomen - Lower Quadrant: Other: - Betadine paint Wound #4 Right Achilles: Other: - Betadine paint Wound #5 Right,Lateral Lower Leg: Other: - Betadine paint Wound #7 Distal Abdomen - midline: Other: - Betadine paint Secondary Dressing: Wound #1 Right Toe Third: Gauze and Kerlix/Conform Wound #2 Right Toe Fourth: Gauze and  Kerlix/Conform Wound #3 Right Calcaneus: Gauze and Kerlix/Conform Wound #4 Right Achilles: Gauze and Kerlix/Conform Wound #5 Right,Lateral Lower Leg: Gauze and Kerlix/Conform Wound #6 Left Abdomen - Lower Quadrant: Boardered Foam Dressing Wound #7 Distal Abdomen - midline: Boardered Foam Dressing Dressing Change Frequency: Wound #1 Right Toe Third: Change dressing every day. Wound #2 Right Toe Fourth: Change dressing every day. Wound #3 Right Calcaneus: Change dressing every day. Wound #4 Right Achilles: Change dressing every day. PURVI, RUEHL ([id number]) Wound #5 Right,Lateral Lower Leg: Change dressing every day. Wound #6 Left Abdomen - Lower Quadrant: Change dressing every day. Wound #7 Distal Abdomen -  midline: Change dressing every day. Follow-up Appointments: Wound #1 Right Toe Third: Return Appointment in 1 week. Wound #2 Right Toe Fourth: Return Appointment in 1 week. Wound #3 Right Calcaneus: Return Appointment in 1 week. Wound #4 Right Achilles: Return Appointment in 1 week. Wound #5 Right,Lateral Lower Leg: Return Appointment in 1 week. Wound #6 Left Abdomen - Lower Quadrant: Return Appointment in 1 week. Wound #7 Distal Abdomen - midline: Return Appointment in 1 week. Additional Orders / Instructions: Wound #1 Right Toe Third: Increase protein intake. Activity as tolerated Wound #2 Right Toe Fourth: Increase protein intake. Activity as tolerated Wound #3 Right Calcaneus: Increase protein intake. Activity as tolerated Wound #4 Right Achilles: Increase protein intake. Activity as tolerated Wound #5 Right,Lateral Lower Leg: Increase protein intake. Activity as tolerated Wound #6 Left Abdomen - Lower Quadrant: Increase protein intake. Activity as tolerated Wound #7 Distal Abdomen - midline: Increase protein intake. Activity as tolerated I have recommended: BRETTNEY, FICKEN ([id number]) 1. Santyl ointment to her medial abdominal wall wounds. the lateral wound we will keep painted with Betadine. 2. Betadine paint to the toes on her right foot which have dry gangrene and to the right heel which is of indeterminate depth pressure ulcer 3. Santyl ointment to the right lower extremity and to the right Achilles tendon 4. reviewed the notes of a vascular procedure and the investigations done at Dr. Driscilla Grammes office. 5. more detailed notes from her endocrinologist(get a recent hemoglobin A1c) and cardiologist. I have asked the family to review with cardiology to see if she will be fit for hyperbaric oxygen therapy. 6. after adequate preparation and giving her adequate consultative therapy and aggressive wound care she may benefit from hyperbaric oxygen therapy and this has  been discussed with the patient and her daughter and all questions regarding this uncertain. We will also give her some literature to read. Electronic Signature(s) Signed: 10/24/2015 11:24:36 AM By: Evlyn Kanner MD, FACS Previous Signature: 10/21/2015 3:46:23 PM Version By: Evlyn Kanner MD, FACS Previous Signature: 10/21/2015 2:31:25 PM Version By: Evlyn Kanner MD, FACS Entered By: Evlyn Kanner on 10/24/2015 11:24:36 [name] ([id number]) -------------------------------------------------------------------------------- SuperBill Details Patient Name: [name] Date of Service: 10/21/2015 Medical Record Number: [id number] Patient Account Number: [id number] Date of Birth/Sex: Aug 22, 1933 (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 S30.1XXA Contusion of abdominal wall, initial encounter Unspecified open wound of abdominal wall, left lower quadrant without penetration into S31.104A peritoneal cavity, initial encounter L89.610 Pressure ulcer of right heel, unstageable Facility Procedures CPT4 Code Description: [id number] -  DEB SUBQ TISSUE 20 SQ CM/< ICD-10 Description Diagnosis E11.621 Type 2 diabetes mellitus with foot ulcer I70.233 Atherosclerosis of native arteries of right leg with u E11.52 Type 2 diabetes mellitus with diabetic  peripheral angi L89.610 Pressure ulcer of right heel, unstageable Modifier: lceration o opathy with Quantity: 1 f ankle gangrene CPT4 Code Description: [id number] 11045 - DEB SUBQ TISS EA ADDL 20CM ICD-10 Description Diagnosis E11.621  Type 2 diabetes mellitus with foot ulcer I70.233 Atherosclerosis of native arteries of right leg with u E11.52 Type 2 diabetes mellitus with diabetic  peripheral angi L89.610 Pressure ulcer of right heel, unstageable Modifier: lceration o opathy with Quantity: 1 f ankle gangrene Physician Procedures CPT4: Description Modifier Quantity Code [id number] - WC PHYS LEVEL 3 - EST PT 25 1 ICD-10 Description Diagnosis ARITZA, BRUNET ([id number]) Electronic Signature(s) Signed: 10/21/2015 2:32:00 PM By: Evlyn Kanner MD, FACS Entered By: Evlyn Kanner on 10/21/2015 14:32:00

## 2015-11-01 ENCOUNTER — Encounter: Payer: Medicare Other | Attending: Surgery | Admitting: Surgery

## 2015-11-01 DIAGNOSIS — N186 End stage renal disease: Secondary | ICD-10-CM | POA: Insufficient documentation

## 2015-11-01 DIAGNOSIS — L89610 Pressure ulcer of right heel, unstageable: Secondary | ICD-10-CM | POA: Diagnosis not present

## 2015-11-01 DIAGNOSIS — J9620 Acute and chronic respiratory failure, unspecified whether with hypoxia or hypercapnia: Secondary | ICD-10-CM | POA: Insufficient documentation

## 2015-11-01 DIAGNOSIS — I4891 Unspecified atrial fibrillation: Secondary | ICD-10-CM | POA: Diagnosis not present

## 2015-11-01 DIAGNOSIS — Z794 Long term (current) use of insulin: Secondary | ICD-10-CM | POA: Insufficient documentation

## 2015-11-01 DIAGNOSIS — E11621 Type 2 diabetes mellitus with foot ulcer: Secondary | ICD-10-CM | POA: Diagnosis present

## 2015-11-01 DIAGNOSIS — I70233 Atherosclerosis of native arteries of right leg with ulceration of ankle: Secondary | ICD-10-CM | POA: Insufficient documentation

## 2015-11-01 DIAGNOSIS — L97319 Non-pressure chronic ulcer of right ankle with unspecified severity: Secondary | ICD-10-CM | POA: Insufficient documentation

## 2015-11-01 DIAGNOSIS — S31104A Unspecified open wound of abdominal wall, left lower quadrant without penetration into peritoneal cavity, initial encounter: Secondary | ICD-10-CM | POA: Diagnosis not present

## 2015-11-01 DIAGNOSIS — Z8673 Personal history of transient ischemic attack (TIA), and cerebral infarction without residual deficits: Secondary | ICD-10-CM | POA: Diagnosis not present

## 2015-11-01 DIAGNOSIS — X58XXXA Exposure to other specified factors, initial encounter: Secondary | ICD-10-CM | POA: Insufficient documentation

## 2015-11-01 DIAGNOSIS — S301XXA Contusion of abdominal wall, initial encounter: Secondary | ICD-10-CM | POA: Insufficient documentation

## 2015-11-01 DIAGNOSIS — I5033 Acute on chronic diastolic (congestive) heart failure: Secondary | ICD-10-CM | POA: Insufficient documentation

## 2015-11-01 DIAGNOSIS — E1122 Type 2 diabetes mellitus with diabetic chronic kidney disease: Secondary | ICD-10-CM | POA: Insufficient documentation

## 2015-11-01 DIAGNOSIS — E1152 Type 2 diabetes mellitus with diabetic peripheral angiopathy with gangrene: Secondary | ICD-10-CM | POA: Insufficient documentation

## 2015-11-01 DIAGNOSIS — Z992 Dependence on renal dialysis: Secondary | ICD-10-CM | POA: Insufficient documentation

## 2015-11-02 NOTE — Progress Notes (Signed)
HADEEL, HILLEBRAND ([id number]) Visit Report for 11/01/2015 Chief Complaint Document Details Patient Name: [name], [name]. Date of Service: 11/01/2015 1:30 PM Medical Record Patient Account Number: [id number] Number: Afful, RN, BSN, Treating RN: [birth year] (82 y.o. [place] Sink Date of Birth/Sex: Female) Other Clinician: Primary Care Physician: [name] Treating Evlyn Kanner Referring Physician: [name] Physician/Extender: Tania Ade in Treatment: 4 Information Obtained from: Patient Chief Complaint Patients presents for treatment of an open diabetic ulcer, arterial ulcer and pressure ulcers to the right lower extremities which is a complex etiology for the last several months. He also has ulcers on her abdomen in the left lower quadrant and left suprapubic area which she's had for about 4 months. Electronic Signature(s) Signed: 11/01/2015 2:31:47 PM By: Evlyn Kanner MD, FACS Entered By: Evlyn Kanner on 11/01/2015 14:31:47 [name] ([id number]) -------------------------------------------------------------------------------- HPI Details Patient Name: [name] Date of Service: 11/01/2015 1:30 PM Medical Record Patient Account Number: [id number] Number: Afful, RN, BSN, Treating RN: [birth year] (82 y.o. [place] Sink Date of Birth/Sex: Female) Other Clinician: Primary Care Physician: [name] Treating Welton Bord Referring Physician: [name] Physician/Extender: Weeks in Treatment: 4 History of Present Illness Location: several wounds on the right lower extremity including her right healed right posterior ankle and right lower third of the leg. She also has wounds on her left lower quadrant of the abdomen and the suprapubic area. Quality: Patient reports experiencing a sharp pain to affected area(s). Severity: Patient states wound are getting worse. Duration: Patient has had the wound for > 3 months prior to seeking treatment at the wound  center Timing: Pain in wound is Intermittent (comes and goes Context: The wound appeared gradually over time Modifying Factors: Other treatment(s) tried include:he simply been admitted to the hospital 2 weeks ago and has had procedures done on her right lower extremity and had a blockage which we are trying to get some notes Associated Signs and Symptoms: Patient reports having increase discharge. HPI Description: 82 year old patient who is known to be diabetic, was referred to [place] by Dr. Gavin Potters for a right heel ulceration which she's had for a while. She was recently in hospital for a pneumonia and at that time and got delirious and was disoriented and sometime during this time developed a stage II ulcer on her right heel. Her past medical history is significant for bilateral pneumonia which was treated with injectable antibiotics and then to oral Levaquin which he has completed. She also has acute on chronic diastolic CHF, acute on chronic respiratory failure, end-stage renal disease on hemodialysis, atrial fibrillation, recent stroke, diabetes mellitus. The patient and her son are poor historians but from what I understand she was admitted to the hospital with an acute vascular compromise of her right lower extremity and Dr. Wyn Quaker has done a surgical procedure and we are trying to obtain these notes. There are also some vascular workup done and we will try and obtain these notes. the injury to the left lower quadrant of abdomen and the suprapubic area have been there due to a bruise and have been there for several months and no intervention has been done. 10/11/2015 -- on review of the electronics records it was noted that the patient was admitted to the hospital on 09/14/2015 with peripheral vascular disease with claudication, end-stage renal disease, pressure ulcer, chronic atrial fibrillation. She was seen by Dr. Wyn Quaker who did her right lower extremity angiogram , angioplasty of the right  anterior tibial artery and thrombolysis with TPA of  the right popliteal artery, and thrombectomy. She was seen by Dr. Wyn Quaker during this past week and he was pleased with the progress. He did say that if he took her to the operating room for any procedure he would debride the abdominal wound under anesthesia. She was also seen by Dr. Ether Griffins the podiatrist who thought that she may lose her right fourth toe at some stage may need an amputation of this. 10/21/2015 --patient known to Dr. Wyn Quaker and his last office visit from 10/04/2015 has been reviewed. 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 She also had a persistent ulceration of the right heel and markedly this area and a right third and fourth toe and a small scab on the calf but these are dry and seemed to be improving. Patient also has a left carotid endarterectomy and multiple interventions to a right brachiocephalic AV fistula. After the visit he had recommended noninvasive studies to recheck her revascularization. He was off the impression that she would likely lose the right fourth toe and the third toe was likely to heal. He was concerned about underlying muscle necrosis on her right heel and midfoot. 11/01/2015 -- an echo done in January of this year showed her left ventricular ejection fraction to be about 50-55%. The patient was seen by the PA and Dr. Driscilla Grammes office and the plan was to take her to the operating room soon to have a debridement under anesthesia for the abdominal wall wound, the Achilles tendon on the right leg and amputation of the right fourth toe. The daughter and the patient do not feel that they would be able to undergo hyperbaric oxygen therapy 5 days a week for 6 weeks. Electronic Signature(s) Signed: 11/01/2015 2:46:39 PM By: Evlyn Kanner MD, FACS Previous Signature:  11/01/2015 2:09:45 PM Version By: Evlyn Kanner MD, FACS Entered By: Evlyn Kanner on 11/01/2015 14:46:38 [name] ([id number]) -------------------------------------------------------------------------------- Physical Exam Details Patient Name: [name] Date of Service: 11/01/2015 1:30 PM Medical Record Patient Account Number: [id number] Number: Afful, RN, BSN, Treating RN: Apr 23, 1934 (82 y.o. [place] Sink Date of Birth/Sex: Female) Other Clinician: Primary Care Physician: [name] Treating Evlyn Kanner Referring Physician: [name] Physician/Extender: Weeks in Treatment: 4 Constitutional . Pulse regular. Respirations normal and unlabored. Afebrile. . Eyes Nonicteric. Reactive to light. Ears, Nose, Mouth, and Throat Lips, teeth, and gums WNL.Marland Kitchen Moist mucosa without lesions. Neck supple and nontender. No palpable supraclavicular or cervical adenopathy. Normal sized without goiter. Respiratory WNL. No retractions.. Cardiovascular Pedal Pulses WNL. No clubbing, cyanosis or edema. Lymphatic No adneopathy. No adenopathy. No adenopathy. Musculoskeletal Adexa without tenderness or enlargement.. Digits and nails w/o clubbing, cyanosis, infection, petechiae, ischemia, or inflammatory conditions.. Integumentary (Hair, Skin) No suspicious lesions. No crepitus or fluctuance. No peri-wound warmth or erythema. No masses.Marland Kitchen Psychiatric Judgement and insight Intact.. No evidence of depression, anxiety, or agitation.. Notes on reviewing the patient's wounds today the left lateral abdominal wall wound continues to have some dry eschar but below this there is bogginess and significant tenderness and I believe this needs to be sharply debrided in the operating room. The mid lower abdomen wound continues to have necrotic debris and sharp debridement is not possible because of local tenderness. The right Achilles tendon area continues to have significant amount of  eschar and debris and none was removed today in view of her impending surgical debridement. Electronic Signature(s)  Signed: 11/01/2015 2:48:22 PM By: Evlyn Kanner MD, FACS Entered By: Evlyn Kanner on 11/01/2015 14:48:21 [name], [name] ([id number]) [name] ([id number]) -------------------------------------------------------------------------------- Physician Orders Details Patient Name: [name], [name]. Date of Service: 11/01/2015 1:30 PM Medical Record Patient Account Number: [id number] Number: Afful, RN, BSN, Treating RN: Jun 21, 1933 (82 y.o. [place] Sink Date of Birth/Sex: Female) Other Clinician: Primary Care Physician: [name] Treating Kayela Humphres Referring Physician: [name] Physician/Extender: Tania Ade in Treatment: 4 Verbal / Phone Orders: Yes Clinician: Afful, RN, BSN, Rita Read Back and Verified: Yes Diagnosis Coding ICD-10 Coding Code Description E11.621 Type 2 diabetes mellitus with foot ulcer I70.233 Atherosclerosis of native arteries of right leg with ulceration of ankle E11.52 Type 2 diabetes mellitus with diabetic peripheral angiopathy with gangrene S30.1XXA Contusion of abdominal wall, initial encounter Unspecified open wound of abdominal wall, left lower quadrant without penetration into S31.104A peritoneal cavity, initial encounter L89.610 Pressure ulcer of right heel, unstageable Wound Cleansing Wound #1 Right Toe Third o Cleanse wound with mild soap and water o May Shower, gently pat wound dry prior to applying new dressing. Anesthetic Wound #1 Right Toe Third o Topical Lidocaine 4% cream applied to wound bed prior to debridement Wound #2 Right Toe Fourth o Topical Lidocaine 4% cream applied to wound bed prior to debridement Wound #3 Right Calcaneus o Topical Lidocaine 4% cream applied to wound bed prior to debridement Wound #4 Right Achilles o Topical Lidocaine 4% cream applied to wound bed prior to  debridement Wound #5 Right,Lateral Lower Leg o Topical Lidocaine 4% cream applied to wound bed prior to debridement Wound #6 Left Abdomen - Lower Quadrant o Topical Lidocaine 4% cream applied to wound bed prior to debridement [name], [name] ([id number]) Wound #7 Distal Abdomen - midline o Topical Lidocaine 4% cream applied to wound bed prior to debridement Primary Wound Dressing Wound #1 Right Toe Third o Other: - Betadine paint Wound #2 Right Toe Fourth o Other: - Betadine paint Wound #3 Right Calcaneus o Other: - Betadine paint Wound #4 Right Achilles o Santyl Ointment Wound #5 Right,Lateral Lower Leg o Santyl Ointment Wound #6 Left Abdomen - Lower Quadrant o Santyl Ointment o Other: - Betadine paint Wound #7 Distal Abdomen - midline o Santyl Ointment Secondary Dressing Wound #1 Right Toe Third o Gauze and Kerlix/Conform Wound #2 Right Toe Fourth o Gauze and Kerlix/Conform Wound #3 Right Calcaneus o Gauze and Kerlix/Conform Wound #4 Right Achilles o Gauze and Kerlix/Conform Wound #5 Right,Lateral Lower Leg o Gauze and Kerlix/Conform Wound #6 Left Abdomen - Lower Quadrant o Boardered Foam Dressing Wound #7 Distal Abdomen - midline o Boardered Foam Dressing [name], [name] ([id number]) Dressing Change Frequency Wound #1 Right Toe Third o Change dressing every day. Wound #2 Right Toe Fourth o Change dressing every day. Wound #3 Right Calcaneus o Change dressing every day. Wound #4 Right Achilles o Change dressing every day. Wound #5 Right,Lateral Lower Leg o Change dressing every day. Wound #6 Left Abdomen - Lower Quadrant o Change dressing every day. Wound #7 Distal Abdomen - midline o Change dressing every day. Follow-up Appointments Wound #1 Right Toe Third o Return Appointment in 1 week. Wound #2 Right Toe Fourth o Return Appointment in 1 week. Wound #3 Right Calcaneus o Return Appointment in 1  week. Wound #4 Right Achilles o Return Appointment in 1 week. Wound #5 Right,Lateral Lower Leg o Return Appointment in 1 week. Wound #6 Left Abdomen - Lower Quadrant o Return Appointment in 1 week. Wound #  7 Distal Abdomen - midline o Return Appointment in 1 week. Additional Orders / Instructions Wound #1 Right Toe Third o Increase protein intake. o Activity as tolerated [name], [name] ([id number]) Wound #2 Right Toe Fourth o Increase protein intake. o Activity as tolerated Wound #3 Right Calcaneus o Increase protein intake. o Activity as tolerated Wound #4 Right Achilles o Increase protein intake. o Activity as tolerated Wound #5 Right,Lateral Lower Leg o Increase protein intake. o Activity as tolerated Wound #6 Left Abdomen - Lower Quadrant o Increase protein intake. o Activity as tolerated Wound #7 Distal Abdomen - midline o Increase protein intake. o Activity as tolerated Electronic Signature(s) Signed: 11/01/2015 4:25:47 PM By: Evlyn Kanner MD, FACS Signed: 11/01/2015 5:15:14 PM By: Elpidio Eric BSN, RN Entered By: Elpidio Eric on 11/01/2015 14:41:39 [name] ([id number]) -------------------------------------------------------------------------------- Problem List Details Patient Name: [name], [name]. Date of Service: 11/01/2015 1:30 PM Medical Record Patient Account Number: [id number] Number: Afful, RN, BSN, Treating RN: [birth year] (82 y.o. [place] Sink Date of Birth/Sex: Female) Other Clinician: Primary Care Physician: [name] Treating Evlyn Kanner Referring Physician: [name] Physician/Extender: Tania Ade in Treatment: 4 Active Problems ICD-10 Encounter Code Description Active Date Diagnosis E11.621 Type 2 diabetes mellitus with foot ulcer 09/30/2015 Yes I70.233 Atherosclerosis of native arteries of right leg with 09/30/2015 Yes ulceration of ankle E11.52 Type 2 diabetes mellitus with diabetic  peripheral 09/30/2015 Yes angiopathy with gangrene S30.1XXA Contusion of abdominal wall, initial encounter 09/30/2015 Yes S31.104A Unspecified open wound of abdominal wall, left lower 09/30/2015 Yes quadrant without penetration into peritoneal cavity, initial encounter L89.610 Pressure ulcer of right heel, unstageable 09/30/2015 Yes Inactive Problems Resolved Problems Electronic Signature(s) Signed: 11/01/2015 2:31:18 PM By: Evlyn Kanner MD, FACS Entered By: Evlyn Kanner on 11/01/2015 14:31:17 [name] ([id number]) -------------------------------------------------------------------------------- Progress Note Details Patient Name: [name] Date of Service: 11/01/2015 1:30 PM Medical Record Patient Account Number: [id number] Number: Afful, RN, BSN, Treating RN: 07-06-1933 (82 y.o. [place] Sink Date of Birth/Sex: Female) Other Clinician: Primary Care Physician: [name] Treating Evlyn Kanner Referring Physician: [name] Physician/Extender: Tania Ade in Treatment: 4 Subjective Chief Complaint Information obtained from Patient Patients presents for treatment of an open diabetic ulcer, arterial ulcer and pressure ulcers to the right lower extremities which is a complex etiology for the last several months. He also has ulcers on her abdomen in the left lower quadrant and left suprapubic area which she's had for about 4 months. History of Present Illness (HPI) The following HPI elements were documented for the patient's wound: Location: several wounds on the right lower extremity including her right healed right posterior ankle and right lower third of the leg. She also has wounds on her left lower quadrant of the abdomen and the suprapubic area. Quality: Patient reports experiencing a sharp pain to affected area(s). Severity: Patient states wound are getting worse. Duration: Patient has had the wound for > 3 months prior to seeking treatment at the wound  center Timing: Pain in wound is Intermittent (comes and goes Context: The wound appeared gradually over time Modifying Factors: Other treatment(s) tried include:he simply been admitted to the hospital 2 weeks ago and has had procedures done on her right lower extremity and had a blockage which we are trying to get some notes Associated Signs and Symptoms: Patient reports having increase discharge. 82 year old patient who is known to be diabetic, was referred to [place] by Dr. Gavin Potters for a right heel ulceration which she's had for a while. She was  recently in hospital for a pneumonia and at that time and got delirious and was disoriented and sometime during this time developed a stage II ulcer on her right heel. Her past medical history is significant for bilateral pneumonia which was treated with injectable antibiotics and then to oral Levaquin which he has completed. She also has acute on chronic diastolic CHF, acute on chronic respiratory failure, end-stage renal disease on hemodialysis, atrial fibrillation, recent stroke, diabetes mellitus. The patient and her son are poor historians but from what I understand she was admitted to the hospital with an acute vascular compromise of her right lower extremity and Dr. Wyn Quaker has done a surgical procedure and we are trying to obtain these notes. There are also some vascular workup done and we will try and obtain these notes. the injury to the left lower quadrant of abdomen and the suprapubic area have been there due to a bruise and have been there for several months and no intervention has been done. 10/11/2015 -- on review of the electronics records it was noted that the patient was admitted to the hospital [name], [name] ([id number]) on 09/14/2015 with peripheral vascular disease with claudication, end-stage renal disease, pressure ulcer, chronic atrial fibrillation. She was seen by Dr. Wyn Quaker who did her right lower extremity angiogram , angioplasty of  the right anterior tibial artery and thrombolysis with TPA of the right popliteal artery, and thrombectomy. She was seen by Dr. Wyn Quaker during this past week and he was pleased with the progress. He did say that if he took her to the operating room for any procedure he would debride the abdominal wound under anesthesia. She was also seen by Dr. Ether Griffins the podiatrist who thought that she may lose her right fourth toe at some stage may need an amputation of this. 10/21/2015 --patient known to Dr. Wyn Quaker and his last office visit from 10/04/2015 has been reviewed. 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 She also had a persistent ulceration of the right heel and markedly this area and a right third and fourth toe and a small scab on the calf but these are dry and seemed to be improving. Patient also has a left carotid endarterectomy and multiple interventions to a right brachiocephalic AV fistula. After the visit he had recommended noninvasive studies to recheck her revascularization. He was off the impression that she would likely lose the right fourth toe and the third toe was likely to heal. He was concerned about underlying muscle necrosis on her right heel and midfoot. 11/01/2015 -- an echo done in January of this year showed her left ventricular ejection fraction to be about 50-55%. The patient was seen by the PA and Dr. Driscilla Grammes office and the plan was to take her to the operating room soon to have a debridement under anesthesia for the abdominal wall wound, the Achilles tendon on the right leg and amputation of the right fourth toe. The daughter and the patient do not feel that they would be able to undergo hyperbaric oxygen therapy 5 days a week for 6 weeks. Objective Constitutional Pulse regular. Respirations normal and unlabored. Afebrile. Vitals Time Taken: 2:02 PM,  Height: 65 in, Weight: 159 lbs, BMI: 26.5, Temperature: 98.2 F, Pulse: 76 bpm, Respiratory Rate: 16 breaths/min, Blood Pressure: 122/54 mmHg. Eyes Nonicteric. Reactive to light. Ears, Nose, Mouth, and Throat Lips, teeth, and gums WNL.Marland Kitchen Moist mucosa without lesions.  Neck supple and nontender. No palpable supraclavicular or cervical adenopathy. Normal sized without goiter. [name], [name] ([id number]) Respiratory WNL. No retractions.. Cardiovascular Pedal Pulses WNL. No clubbing, cyanosis or edema. Lymphatic No adneopathy. No adenopathy. No adenopathy. Musculoskeletal Adexa without tenderness or enlargement.. Digits and nails w/o clubbing, cyanosis, infection, petechiae, ischemia, or inflammatory conditions.Marland Kitchen Psychiatric Judgement and insight Intact.. No evidence of depression, anxiety, or agitation.. General Notes: on reviewing the patient's wounds today the left lateral abdominal wall wound continues to have some dry eschar but below this there is bogginess and significant tenderness and I believe this needs to be sharply debrided in the operating room. The mid lower abdomen wound continues to have necrotic debris and sharp debridement is not possible because of local tenderness. The right Achilles tendon area continues to have significant amount of eschar and debris and none was removed today in view of her impending surgical debridement. Integumentary (Hair, Skin) No suspicious lesions. No crepitus or fluctuance. No peri-wound warmth or erythema. No masses.. Wound #1 status is Open. Original cause of wound was Gradually Appeared. The wound is located on the Right Toe Third. The wound measures 0.6cm length x 0.6cm width x 0.1cm depth; 0.283cm^2 area and 0.028cm^3 volume. The wound is limited to skin breakdown. There is no tunneling or undermining noted. There is a none present amount of drainage noted. The wound margin is flat and intact. There is no granulation within the wound  bed. There is a large (67-100%) amount of necrotic tissue within the wound bed including Eschar. The periwound skin appearance exhibited: Localized Edema, Dry/Scaly, Erythema. The periwound skin appearance did not exhibit: Callus, Crepitus, Excoriation, Fluctuance, Friable, Induration, Rash, Scarring, Maceration, Moist, Atrophie Blanche, Cyanosis, Ecchymosis, Hemosiderin Staining, Mottled, Pallor, Rubor. The surrounding wound skin color is noted with erythema which is circumferential. The periwound has tenderness on palpation. Wound #2 status is Open. Original cause of wound was Not Known. The wound is located on the Right Toe Fourth. The wound measures 6cm length x 4cm width x 0.1cm depth; 18.85cm^2 area and 1.885cm^3 volume. The wound is limited to skin breakdown. There is no tunneling or undermining noted. There is a medium amount of serous drainage noted. The wound margin is flat and intact. There is small (1-33%) pink granulation within the wound bed. There is a large (67-100%) amount of necrotic tissue within the wound bed including Eschar and Adherent Slough. The periwound skin appearance exhibited: Localized Edema, Maceration, Moist, Erythema. The periwound skin appearance did not exhibit: Callus, Crepitus, Excoriation, Fluctuance, Friable, Induration, Rash, Scarring, Dry/Scaly, Atrophie Blanche, Cyanosis, Ecchymosis, Hemosiderin Staining, Mottled, Pallor, Rubor. The surrounding wound skin color is noted with erythema which is circumferential. The periwound has tenderness on palpation. Wound #3 status is Open. Original cause of wound was Not Known. The wound is located on the Right Calcaneus. The wound measures 1.5cm length x 1.5cm width x 0.1cm depth; 1.767cm^2 area and [name], [name]. ([id number]) 0.177cm^3 volume. The wound is limited to skin breakdown. There is no tunneling or undermining noted. There is a none present amount of drainage noted. The wound margin is flat and intact.  There is no granulation within the wound bed. There is a large (67-100%) amount of necrotic tissue within the wound bed including Eschar. The periwound skin appearance exhibited: Localized Edema, Dry/Scaly, Erythema. The periwound skin appearance did not exhibit: Callus, Crepitus, Excoriation, Fluctuance, Friable, Induration, Rash, Scarring, Maceration, Moist, Atrophie Blanche, Cyanosis, Ecchymosis, Hemosiderin Staining, Mottled, Pallor, Rubor. The surrounding wound skin color is  noted with erythema which is circumferential. The periwound has tenderness on palpation. Wound #4 status is Open. Original cause of wound was Not Known. The wound is located on the Right Achilles. The wound measures 5.5cm length x 6cm width x 0.3cm depth; 25.918cm^2 area and 7.775cm^3 volume. The wound is limited to skin breakdown. There is no tunneling or undermining noted. There is a large amount of serous drainage noted. The wound margin is flat and intact. There is small (1-33%) pink granulation within the wound bed. There is a large (67-100%) amount of necrotic tissue within the wound bed including Eschar and Adherent Slough. The periwound skin appearance exhibited: Localized Edema, Moist, Erythema. The periwound skin appearance did not exhibit: Callus, Crepitus, Excoriation, Fluctuance, Friable, Induration, Rash, Scarring, Dry/Scaly, Maceration, Atrophie Blanche, Cyanosis, Ecchymosis, Hemosiderin Staining, Mottled, Pallor, Rubor. The surrounding wound skin color is noted with erythema which is circumferential. Periwound temperature was noted as Cool/Cold. The periwound has tenderness on palpation. Wound #5 status is Open. Original cause of wound was Not Known. The wound is located on the Right,Lateral Lower Leg. The wound measures 6cm length x 3.5cm width x 0.1cm depth; 16.493cm^2 area and 1.649cm^3 volume. The wound is limited to skin breakdown. There is no tunneling or undermining noted. There is a medium amount  of serous drainage noted. The wound margin is flat and intact. There is no granulation within the wound bed. There is a large (67-100%) amount of necrotic tissue within the wound bed including Eschar and Adherent Slough. The periwound skin appearance exhibited: Localized Edema, Dry/Scaly, Moist, Erythema. The periwound skin appearance did not exhibit: Callus, Crepitus, Excoriation, Fluctuance, Friable, Induration, Rash, Scarring, Maceration, Atrophie Blanche, Cyanosis, Ecchymosis, Hemosiderin Staining, Mottled, Pallor, Rubor. The surrounding wound skin color is noted with erythema which is circumferential. The periwound has tenderness on palpation. Wound #6 status is Open. Original cause of wound was Trauma. The wound is located on the Left Abdomen - Lower Quadrant. The wound measures 4cm length x 10.2cm width x 0.1cm depth; 32.044cm^2 area and 3.204cm^3 volume. The wound is limited to skin breakdown. There is no tunneling or undermining noted. There is a small amount of drainage noted. The wound margin is flat and intact. There is no granulation within the wound bed. There is a large (67-100%) amount of necrotic tissue within the wound bed including Eschar. The periwound skin appearance exhibited: Localized Edema, Dry/Scaly, Erythema. The periwound skin appearance did not exhibit: Callus, Crepitus, Excoriation, Fluctuance, Friable, Induration, Rash, Scarring, Maceration, Moist, Atrophie Blanche, Cyanosis, Ecchymosis, Hemosiderin Staining, Mottled, Pallor, Rubor. The surrounding wound skin color is noted with erythema which is circumferential. The periwound has tenderness on palpation. Wound #7 status is Open. Original cause of wound was Trauma. The wound is located on the Distal Abdomen - midline. The wound measures 6cm length x 3cm width x 0.3cm depth; 14.137cm^2 area and 4.241cm^3 volume. The wound is limited to skin breakdown. There is no tunneling or undermining noted. There is a medium  amount of serous drainage noted. The wound margin is flat and intact. There is no granulation within the wound bed. There is a large (67-100%) amount of necrotic tissue within the wound bed including Eschar and Adherent Slough. The periwound skin appearance exhibited: Moist, Erythema. The periwound skin appearance did not exhibit: Callus, Crepitus, Excoriation, Fluctuance, Friable, Induration, Localized Edema, Rash, Scarring, Dry/Scaly, Maceration, Atrophie Blanche, Cyanosis, Natalie RimaHOPKINS, Arie Y. ([id number]) Ecchymosis, Hemosiderin Staining, Mottled, Pallor, Rubor. The surrounding wound skin color is noted with erythema which is circumferential.  The periwound has tenderness on palpation. 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 Having discussed hyperbaric oxygen therapy the patient and her daughter declined due to her physically not being able to tolerate a trip to the wound care center every day. I anticipate application of a wound VAC to her wounds on the abdomen and would be happy to see her back in the wound clinic once she is discharged from the hospital. Plan Wound Cleansing: Wound #1 Right Toe Third: Cleanse wound with mild soap and water May Shower, gently pat wound dry prior to applying new dressing. Anesthetic: Wound #1 Right Toe Third: Topical Lidocaine 4% cream applied to wound bed prior to  debridement Wound #2 Right Toe Fourth: Topical Lidocaine 4% cream applied to wound bed prior to debridement Wound #3 Right Calcaneus: Topical Lidocaine 4% cream applied to wound bed prior to debridement Wound #4 Right Achilles: Topical Lidocaine 4% cream applied to wound bed prior to debridement Wound #5 Right,Lateral Lower Leg: [name], [place] ([id number]) Topical Lidocaine 4% cream applied to wound bed prior to debridement Wound #6 Left Abdomen - Lower Quadrant: Topical Lidocaine 4% cream applied to wound bed prior to debridement Wound #7 Distal Abdomen - midline: Topical Lidocaine 4% cream applied to wound bed prior to debridement Primary Wound Dressing: Wound #1 Right Toe Third: Other: - Betadine paint Wound #2 Right Toe Fourth: Other: - Betadine paint Wound #3 Right Calcaneus: Other: - Betadine paint Wound #4 Right Achilles: Santyl Ointment Wound #5 Right,Lateral Lower Leg: Santyl Ointment Wound #6 Left Abdomen - Lower Quadrant: Santyl Ointment Other: - Betadine paint Wound #7 Distal Abdomen - midline: Santyl Ointment Secondary Dressing: Wound #1 Right Toe Third: Gauze and Kerlix/Conform Wound #2 Right Toe Fourth: Gauze and Kerlix/Conform Wound #3 Right Calcaneus: Gauze and Kerlix/Conform Wound #4 Right Achilles: Gauze and Kerlix/Conform Wound #5 Right,Lateral Lower Leg: Gauze and Kerlix/Conform Wound #6 Left Abdomen - Lower Quadrant: Boardered Foam Dressing Wound #7 Distal Abdomen - midline: Boardered Foam Dressing Dressing Change Frequency: Wound #1 Right Toe Third: Change dressing every day. Wound #2 Right Toe Fourth: Change dressing every day. Wound #3 Right Calcaneus: Change dressing every day. Wound #4 Right Achilles: Change dressing every day. Wound #5 Right,Lateral Lower Leg: Change dressing every day. Wound #6 Left Abdomen - Lower Quadrant: Change dressing every day. Wound #7 Distal Abdomen - midline: Change dressing every day. MARLEA, GAMBILL ([id number]) Follow-up Appointments: Wound #1 Right Toe Third: Return Appointment in 1 week. Wound #2 Right Toe Fourth: Return Appointment in 1 week. Wound #3 Right Calcaneus: Return Appointment in 1 week. Wound #4 Right Achilles: Return Appointment in 1 week. Wound #5 Right,Lateral Lower Leg: Return Appointment in 1 week. Wound #6 Left Abdomen - Lower Quadrant: Return Appointment in 1 week. Wound #7 Distal Abdomen - midline: Return Appointment in 1 week. Additional Orders / Instructions: Wound #1 Right Toe Third: Increase protein intake. Activity as tolerated Wound #2 Right Toe Fourth: Increase protein intake. Activity as tolerated  Wound #3 Right Calcaneus: Increase protein intake. Activity as tolerated Wound #4 Right Achilles: Increase protein intake. Activity as tolerated Wound #5 Right,Lateral Lower Leg: Increase protein intake. Activity as tolerated Wound #6 Left Abdomen - Lower Quadrant: Increase protein intake. Activity as tolerated Wound #7 Distal Abdomen - midline: Increase protein intake. Activity as tolerated I have recommended Betadine paint on the right fourth toe and the rest of the wounds will have application of Santyl ointment locally. Having discussed hyperbaric oxygen therapy the patient and her daughter declined due to her physically not being able to tolerate a trip to the wound care center every day. TRISA, CRANOR ([id number]) I anticipate application of a wound VAC to her wounds on the abdomen and would be happy to see her back in the wound clinic once she is discharged from the hospital. Electronic Signature(s) Signed: 11/01/2015 2:49:38 PM By: Evlyn Kanner MD, FACS Entered By: Evlyn Kanner on 11/01/2015 14:49:38 [name] ([id number]) -------------------------------------------------------------------------------- SuperBill Details Patient Name: [name] Date of Service: 11/01/2015 Medical Record Patient Account Number:  [id number] Number: Afful, RN, BSN, Treating RN: 1933/10/21 (83 y.o. 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 S30.1XXA Contusion of abdominal wall, initial encounter Unspecified open wound of abdominal wall, left lower quadrant without penetration into S31.104A peritoneal cavity, initial encounter L89.610 Pressure ulcer of right heel, unstageable Facility Procedures CPT4 Code: 24401027 Description: [phone] - WOUND CARE VISIT-LEV 5 EST PT Modifier: Quantity: 1 Physician Procedures CPT4: Description Modifier Quantity Code [id number] - WC PHYS LEVEL 3 - EST PT 1 ICD-10 Description Diagnosis E11.621 Type 2 diabetes mellitus with foot ulcer I70.233 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

## 2015-11-04 ENCOUNTER — Encounter
Admission: RE | Admit: 2015-11-04 | Discharge: 2015-11-04 | Disposition: A | Discharge status: Home / Self Care | Payer: Medicare Other | Source: Ambulatory Visit | Attending: Vascular Surgery | Admitting: Vascular Surgery

## 2015-11-04 DIAGNOSIS — E1152 Type 2 diabetes mellitus with diabetic peripheral angiopathy with gangrene: Secondary | ICD-10-CM | POA: Diagnosis present

## 2015-11-04 DIAGNOSIS — Z833 Family history of diabetes mellitus: Secondary | ICD-10-CM | POA: Diagnosis not present

## 2015-11-04 DIAGNOSIS — Z794 Long term (current) use of insulin: Secondary | ICD-10-CM | POA: Diagnosis not present

## 2015-11-04 DIAGNOSIS — I499 Cardiac arrhythmia, unspecified: Secondary | ICD-10-CM | POA: Diagnosis not present

## 2015-11-04 DIAGNOSIS — I70234 Atherosclerosis of native arteries of right leg with ulceration of heel and midfoot: Secondary | ICD-10-CM | POA: Diagnosis not present

## 2015-11-04 DIAGNOSIS — Z79899 Other long term (current) drug therapy: Secondary | ICD-10-CM | POA: Diagnosis not present

## 2015-11-04 DIAGNOSIS — L89614 Pressure ulcer of right heel, stage 4: Secondary | ICD-10-CM | POA: Diagnosis present

## 2015-11-04 DIAGNOSIS — E7800 Pure hypercholesterolemia, unspecified: Secondary | ICD-10-CM | POA: Diagnosis not present

## 2015-11-04 DIAGNOSIS — X58XXXA Exposure to other specified factors, initial encounter: Secondary | ICD-10-CM | POA: Diagnosis not present

## 2015-11-04 DIAGNOSIS — I120 Hypertensive chronic kidney disease with stage 5 chronic kidney disease or end stage renal disease: Secondary | ICD-10-CM | POA: Diagnosis not present

## 2015-11-04 DIAGNOSIS — S31109A Unspecified open wound of abdominal wall, unspecified quadrant without penetration into peritoneal cavity, initial encounter: Secondary | ICD-10-CM | POA: Diagnosis not present

## 2015-11-04 DIAGNOSIS — Z8249 Family history of ischemic heart disease and other diseases of the circulatory system: Secondary | ICD-10-CM | POA: Diagnosis not present

## 2015-11-04 DIAGNOSIS — M869 Osteomyelitis, unspecified: Secondary | ICD-10-CM | POA: Diagnosis not present

## 2015-11-04 DIAGNOSIS — E1122 Type 2 diabetes mellitus with diabetic chronic kidney disease: Secondary | ICD-10-CM | POA: Diagnosis not present

## 2015-11-04 DIAGNOSIS — Z7901 Long term (current) use of anticoagulants: Secondary | ICD-10-CM | POA: Diagnosis not present

## 2015-11-04 DIAGNOSIS — Z992 Dependence on renal dialysis: Secondary | ICD-10-CM | POA: Diagnosis not present

## 2015-11-04 DIAGNOSIS — Z86718 Personal history of other venous thrombosis and embolism: Secondary | ICD-10-CM | POA: Diagnosis not present

## 2015-11-04 DIAGNOSIS — Z7982 Long term (current) use of aspirin: Secondary | ICD-10-CM | POA: Diagnosis not present

## 2015-11-04 DIAGNOSIS — M109 Gout, unspecified: Secondary | ICD-10-CM | POA: Diagnosis not present

## 2015-11-04 DIAGNOSIS — E785 Hyperlipidemia, unspecified: Secondary | ICD-10-CM | POA: Diagnosis not present

## 2015-11-04 DIAGNOSIS — N186 End stage renal disease: Secondary | ICD-10-CM | POA: Diagnosis not present

## 2015-11-04 LAB — CBC
HEMATOCRIT: 30 % — AB (ref 35.0–47.0)
HEMOGLOBIN: 9.7 g/dL — AB (ref 12.0–16.0)
MCH: 30.1 pg (ref 26.0–34.0)
MCHC: 32.3 g/dL (ref 32.0–36.0)
MCV: 93.2 fL (ref 80.0–100.0)
Platelets: 330 10*3/uL (ref 150–440)
RBC: 3.22 MIL/uL — ABNORMAL LOW (ref 3.80–5.20)
RDW: 19.9 % — AB (ref 11.5–14.5)
WBC: 10.6 10*3/uL (ref 3.6–11.0)

## 2015-11-04 LAB — BASIC METABOLIC PANEL
Anion gap: 16 — ABNORMAL HIGH (ref 5–15)
BUN: 68 mg/dL — ABNORMAL HIGH (ref 6–20)
CALCIUM: 8.6 mg/dL — AB (ref 8.9–10.3)
CHLORIDE: 97 mmol/L — AB (ref 101–111)
CO2: 19 mmol/L — AB (ref 22–32)
CREATININE: 4.68 mg/dL — AB (ref 0.44–1.00)
GFR calc non Af Amer: 8 mL/min — ABNORMAL LOW (ref >60)
GFR, EST AFRICAN AMERICAN: 9 mL/min — AB (ref >60)
GLUCOSE: 105 mg/dL — AB (ref 65–99)
Potassium: 4 mmol/L (ref 3.5–5.1)
Sodium: 132 mmol/L — ABNORMAL LOW (ref 135–145)

## 2015-11-04 LAB — SURGICAL PCR SCREEN
MRSA, PCR: NEGATIVE
STAPHYLOCOCCUS AUREUS: NEGATIVE

## 2015-11-04 NOTE — Pre-Procedure Instructions (Signed)
Hemogram, Met B, & PT/PTT results sent to Dr. Lucky Cowboy and Anesthesia for review.

## 2015-11-04 NOTE — Patient Instructions (Signed)
Your procedure is scheduled IO:NGEXBMWUon:THURSDAY 11/07/15 Report to Day Surgery. 2ND FLOOR MEDICAL MALL ENTRANCE To find out your arrival time please call [phone] between 1PM - 3PM on Wednesday 11/06/15.  Remember: Instructions that are not followed completely may result in serious medical risk, up to and including death, or upon the discretion of your surgeon and anesthesiologist your surgery may need to be rescheduled.    __X__ 1. Do not eat food or drink liquids after midnight. No gum chewing or hard candies.     __X__ 2. No Alcohol for 24 hours before or after surgery.   ____ 3. Bring all medications with you on the day of surgery if instructed.    __X__ 4. Notify your doctor if there is any change in your medical condition     (cold, fever, infections).     Do not wear jewelry, make-up, hairpins, clips or nail polish.  Do not wear lotions, powders, or perfumes.   Do not shave 48 hours prior to surgery. Men may shave face and neck.  Do not bring valuables to the hospital.    [facility] is not responsible for any belongings or valuables.               Contacts, dentures or bridgework may not be worn into surgery.  Leave your suitcase in the car. After surgery it may be brought to your room.  For patients admitted to the hospital, discharge time is determined by your                treatment team.   Patients discharged the day of surgery will not be allowed to drive home.   Please read over the following fact sheets that you were given:   Surgical Site Infection Prevention   __X__ Take these medicines the morning of surgery with A SIP OF WATER:    1. DILTIAZEM  2. RANITIDINE  3. MAY TAKE TRAMADOL IF NEEDED FOR PAIN  4.  5.  6.  ____ Fleet Enema (as directed)   __X__ Use CHG Soap as directed SAGE WIPES  __X__ Use inhalers on the day of surgery AND BRING DAY OF SURGERY  ____ Stop metformin 2 days prior to surgery    _  X__ Take 1/2 of usual insulin dose the night before  surgery and none on the morning of surgery. TAKE ONLY 5 UNITS AT BEDTIME  __X__ Stop Coumadin/Plavix/aspirin on NO COUMADIN THE DAY BEFORE OR THE DAY OF SURGERY PER DR DEW  ____ Stop Anti-inflammatories on    __X_ Stop supplements until after surgery.    ____ Bring C-Pap to the hospital.

## 2015-11-07 ENCOUNTER — Ambulatory Visit: Payer: Medicare Other | Admitting: Anesthesiology

## 2015-11-07 ENCOUNTER — Ambulatory Visit
Admission: RE | Admit: 2015-11-07 | Discharge: 2015-11-07 | Disposition: A | Discharge status: Home / Self Care | Payer: Medicare Other | Source: Ambulatory Visit | Attending: Vascular Surgery | Admitting: Vascular Surgery

## 2015-11-07 ENCOUNTER — Encounter
Admission: RE | Disposition: A | Discharge status: Home / Self Care | Payer: Self-pay | Source: Ambulatory Visit | Attending: Vascular Surgery

## 2015-11-07 ENCOUNTER — Encounter: Payer: Self-pay | Admitting: *Deleted

## 2015-11-07 DIAGNOSIS — E1152 Type 2 diabetes mellitus with diabetic peripheral angiopathy with gangrene: Secondary | ICD-10-CM | POA: Diagnosis not present

## 2015-11-07 DIAGNOSIS — E1122 Type 2 diabetes mellitus with diabetic chronic kidney disease: Secondary | ICD-10-CM | POA: Insufficient documentation

## 2015-11-07 DIAGNOSIS — N186 End stage renal disease: Secondary | ICD-10-CM | POA: Insufficient documentation

## 2015-11-07 DIAGNOSIS — E785 Hyperlipidemia, unspecified: Secondary | ICD-10-CM | POA: Insufficient documentation

## 2015-11-07 DIAGNOSIS — E7800 Pure hypercholesterolemia, unspecified: Secondary | ICD-10-CM | POA: Insufficient documentation

## 2015-11-07 DIAGNOSIS — I70234 Atherosclerosis of native arteries of right leg with ulceration of heel and midfoot: Secondary | ICD-10-CM | POA: Insufficient documentation

## 2015-11-07 DIAGNOSIS — Z7901 Long term (current) use of anticoagulants: Secondary | ICD-10-CM | POA: Insufficient documentation

## 2015-11-07 DIAGNOSIS — I120 Hypertensive chronic kidney disease with stage 5 chronic kidney disease or end stage renal disease: Secondary | ICD-10-CM | POA: Insufficient documentation

## 2015-11-07 DIAGNOSIS — Z794 Long term (current) use of insulin: Secondary | ICD-10-CM | POA: Insufficient documentation

## 2015-11-07 DIAGNOSIS — X58XXXA Exposure to other specified factors, initial encounter: Secondary | ICD-10-CM | POA: Insufficient documentation

## 2015-11-07 DIAGNOSIS — Z992 Dependence on renal dialysis: Secondary | ICD-10-CM | POA: Insufficient documentation

## 2015-11-07 DIAGNOSIS — M869 Osteomyelitis, unspecified: Secondary | ICD-10-CM | POA: Insufficient documentation

## 2015-11-07 DIAGNOSIS — S31109A Unspecified open wound of abdominal wall, unspecified quadrant without penetration into peritoneal cavity, initial encounter: Secondary | ICD-10-CM | POA: Insufficient documentation

## 2015-11-07 DIAGNOSIS — Z7982 Long term (current) use of aspirin: Secondary | ICD-10-CM | POA: Insufficient documentation

## 2015-11-07 DIAGNOSIS — I499 Cardiac arrhythmia, unspecified: Secondary | ICD-10-CM | POA: Insufficient documentation

## 2015-11-07 DIAGNOSIS — Z79899 Other long term (current) drug therapy: Secondary | ICD-10-CM | POA: Insufficient documentation

## 2015-11-07 DIAGNOSIS — L8990 Pressure ulcer of unspecified site, unspecified stage: Secondary | ICD-10-CM

## 2015-11-07 DIAGNOSIS — M109 Gout, unspecified: Secondary | ICD-10-CM | POA: Insufficient documentation

## 2015-11-07 DIAGNOSIS — L89614 Pressure ulcer of right heel, stage 4: Secondary | ICD-10-CM | POA: Insufficient documentation

## 2015-11-07 DIAGNOSIS — Z86718 Personal history of other venous thrombosis and embolism: Secondary | ICD-10-CM | POA: Insufficient documentation

## 2015-11-07 DIAGNOSIS — Z8249 Family history of ischemic heart disease and other diseases of the circulatory system: Secondary | ICD-10-CM | POA: Insufficient documentation

## 2015-11-07 DIAGNOSIS — Z833 Family history of diabetes mellitus: Secondary | ICD-10-CM | POA: Insufficient documentation

## 2015-11-07 HISTORY — PX: AMPUTATION: SHX166

## 2015-11-07 HISTORY — PX: WOUND DEBRIDEMENT: SHX247

## 2015-11-07 LAB — GLUCOSE, CAPILLARY
GLUCOSE-CAPILLARY: 128 mg/dL — AB (ref 65–99)
GLUCOSE-CAPILLARY: 108 mg/dL — AB (ref 65–99)

## 2015-11-07 LAB — PROTIME-INR
INR: 1.47
Prothrombin Time: 17.9 seconds — ABNORMAL HIGH (ref 11.4–15.0)

## 2015-11-07 LAB — POTASSIUM: POTASSIUM: 3.6 mmol/L (ref 3.5–5.1)

## 2015-11-07 SURGERY — AMPUTATION DIGIT
Anesthesia: General | Laterality: Right | Wound class: Dirty or Infected

## 2015-11-07 MED ORDER — ONDANSETRON HCL 4 MG/2ML IJ SOLN
INTRAMUSCULAR | Status: DC | PRN
  Administered 2015-11-07: 4 mg via INTRAVENOUS

## 2015-11-07 MED ORDER — SUCCINYLCHOLINE CHLORIDE 20 MG/ML IJ SOLN
INTRAMUSCULAR | Status: DC | PRN
  Administered 2015-11-07: 80 mg via INTRAVENOUS

## 2015-11-07 MED ORDER — PHENYLEPHRINE HCL 10 MG/ML IJ SOLN
10.0000 mg | INTRAVENOUS | Status: DC | PRN
  Administered 2015-11-07: 20 ug/min via INTRAVENOUS

## 2015-11-07 MED ORDER — CEFAZOLIN SODIUM-DEXTROSE 2-4 GM/100ML-% IV SOLN
2.0000 g | Freq: Once | INTRAVENOUS | Status: AC
  Administered 2015-11-07: 2000 mg via INTRAVENOUS

## 2015-11-07 MED ORDER — FENTANYL CITRATE (PF) 100 MCG/2ML IJ SOLN: 25.0000 ug | INTRAMUSCULAR | Status: DC | PRN

## 2015-11-07 MED ORDER — LIDOCAINE HCL (CARDIAC) 20 MG/ML IV SOLN
INTRAVENOUS | Status: DC | PRN
  Administered 2015-11-07: 100 mg via INTRAVENOUS

## 2015-11-07 MED ORDER — BUPIVACAINE HCL (PF) 0.5 % IJ SOLN
INTRAMUSCULAR | Status: AC
Start: 2015-11-07 — End: 2015-11-07
  Filled 2015-11-07: qty 30

## 2015-11-07 MED ORDER — SODIUM CHLORIDE 0.9 % IV SOLN
INTRAVENOUS | Status: DC
  Administered 2015-11-07: 15:00:00 via INTRAVENOUS

## 2015-11-07 MED ORDER — PHENYLEPHRINE HCL 10 MG/ML IJ SOLN
INTRAMUSCULAR | Status: DC | PRN
  Administered 2015-11-07: 200 ug via INTRAVENOUS

## 2015-11-07 MED ORDER — ROCURONIUM BROMIDE 100 MG/10ML IV SOLN
INTRAVENOUS | Status: DC | PRN
  Administered 2015-11-07: 20 mg via INTRAVENOUS

## 2015-11-07 MED ORDER — ACETAMINOPHEN 10 MG/ML IV SOLN
INTRAVENOUS | Status: AC
  Filled 2015-11-07: qty 100

## 2015-11-07 MED ORDER — FENTANYL CITRATE (PF) 100 MCG/2ML IJ SOLN
INTRAMUSCULAR | Status: DC | PRN
  Administered 2015-11-07: 50 ug via INTRAVENOUS
  Administered 2015-11-07: 150 ug via INTRAVENOUS

## 2015-11-07 MED ORDER — LIDOCAINE HCL (PF) 1 % IJ SOLN
INTRAMUSCULAR | Status: AC
  Filled 2015-11-07: qty 30

## 2015-11-07 MED ORDER — TRAMADOL HCL 50 MG PO TABS: 50.0000 mg | ORAL_TABLET | Freq: Four times a day (QID) | ORAL | Status: DC | PRN

## 2015-11-07 MED ORDER — ACETAMINOPHEN 10 MG/ML IV SOLN
INTRAVENOUS | Status: DC | PRN
  Administered 2015-11-07: 1000 mg via INTRAVENOUS

## 2015-11-07 MED ORDER — ONDANSETRON HCL 4 MG/2ML IJ SOLN
4.0000 mg | Freq: Once | INTRAMUSCULAR | Status: DC | PRN
Start: 2015-11-07 — End: 2015-11-07

## 2015-11-07 MED ORDER — CEFAZOLIN SODIUM-DEXTROSE 2-4 GM/100ML-% IV SOLN
INTRAVENOUS | Status: AC
  Administered 2015-11-07: 2000 mg via INTRAVENOUS
  Filled 2015-11-07: qty 100

## 2015-11-07 MED ORDER — NEOSTIGMINE METHYLSULFATE 10 MG/10ML IV SOLN
INTRAVENOUS | Status: DC | PRN
  Administered 2015-11-07: 3 mg via INTRAVENOUS

## 2015-11-07 MED ORDER — PROPOFOL 10 MG/ML IV BOLUS
INTRAVENOUS | Status: DC | PRN
  Administered 2015-11-07: 100 mg via INTRAVENOUS

## 2015-11-07 MED ORDER — GLYCOPYRROLATE 0.2 MG/ML IJ SOLN
INTRAMUSCULAR | Status: DC | PRN
  Administered 2015-11-07: 0.4 mg via INTRAVENOUS

## 2015-11-07 SURGICAL SUPPLY — 42 items
BANDAGE ELASTIC 6 LF NS (GAUZE/BANDAGES/DRESSINGS) IMPLANT
BNDG GAUZE 4.5X4.1 6PLY STRL (MISCELLANEOUS) ×9 IMPLANT
BRUSH SCRUB 4% CHG (MISCELLANEOUS) ×3 IMPLANT
CANISTER SUCT 1200ML W/VALVE (MISCELLANEOUS) ×3 IMPLANT
CHLORAPREP W/TINT 26ML (MISCELLANEOUS) IMPLANT
DRAPE INCISE IOBAN 66X45 STRL (DRAPES) IMPLANT
DRAPE LAPAROTOMY TRNSV 106X77 (MISCELLANEOUS) ×3 IMPLANT
DRSG VAC ATS MED SENSATRAC (GAUZE/BANDAGES/DRESSINGS) IMPLANT
ELECT CAUTERY BLADE 6.4 (BLADE) ×3 IMPLANT
ELECT REM PT RETURN 9FT ADLT (ELECTROSURGICAL) ×3
ELECTRODE REM PT RTRN 9FT ADLT (ELECTROSURGICAL) ×1 IMPLANT
GAUZE PETRO XEROFOAM 1X8 (MISCELLANEOUS) ×3 IMPLANT
GAUZE SPONGE 4X4 12PLY STRL (GAUZE/BANDAGES/DRESSINGS) ×3 IMPLANT
GLOVE BIO SURGEON STRL SZ7 (GLOVE) ×15 IMPLANT
GOWN STRL REUS W/ TWL LRG LVL3 (GOWN DISPOSABLE) ×3 IMPLANT
GOWN STRL REUS W/ TWL XL LVL3 (GOWN DISPOSABLE) ×1 IMPLANT
GOWN STRL REUS W/TWL LRG LVL3 (GOWN DISPOSABLE) ×6
GOWN STRL REUS W/TWL XL LVL3 (GOWN DISPOSABLE) ×2
HANDLE YANKAUER SUCT BULB TIP (MISCELLANEOUS) ×3 IMPLANT
HANDPIECE SUCTION TUBG SURGILV (MISCELLANEOUS) IMPLANT
IV NS 1000ML (IV SOLUTION)
IV NS 1000ML BAXH (IV SOLUTION) IMPLANT
KIT RM TURNOVER STRD PROC AR (KITS) ×3 IMPLANT
LABEL OR SOLS (LABEL) ×3 IMPLANT
NS IRRIG 1000ML POUR BTL (IV SOLUTION) ×3 IMPLANT
NS IRRIG 500ML POUR BTL (IV SOLUTION) ×3 IMPLANT
PACK EXTREMITY ARMC (MISCELLANEOUS) ×3 IMPLANT
PAD ABD DERMACEA PRESS 5X9 (GAUZE/BANDAGES/DRESSINGS) ×6 IMPLANT
PAD PREP 24X41 OB/GYN DISP (PERSONAL CARE ITEMS) ×3 IMPLANT
SOL PREP PVP 2OZ (MISCELLANEOUS) ×3
SOLUTION PREP PVP 2OZ (MISCELLANEOUS) ×1 IMPLANT
SPONGE LAP 18X18 5 PK (GAUZE/BANDAGES/DRESSINGS) ×3 IMPLANT
STOCKINETTE BIAS CUT 6 980064 (GAUZE/BANDAGES/DRESSINGS) IMPLANT
SUT ETHILON 4-0 (SUTURE) ×2
SUT ETHILON 4-0 FS2 18XMFL BLK (SUTURE) ×1
SUT VIC AB 3-0 SH 27 (SUTURE) ×2
SUT VIC AB 3-0 SH 27X BRD (SUTURE) ×1 IMPLANT
SUT VICRYL 0 AB UR-6 (SUTURE) ×3 IMPLANT
SUTURE ETHLN 4-0 FS2 18XMF BLK (SUTURE) ×1 IMPLANT
SWAB CULTURE AMIES ANAERIB BLU (MISCELLANEOUS) IMPLANT
SYR BULB EAR ULCER 3OZ GRN STR (SYRINGE) ×3 IMPLANT
WND VAC CANISTER 500ML (MISCELLANEOUS) IMPLANT

## 2015-11-07 NOTE — Discharge Instructions (Signed)
AMBULATORY SURGERY  DISCHARGE INSTRUCTIONS   1) The drugs that you were given will stay in your system until tomorrow so for the next 24 hours you should not:  A) Drive an automobile B) Make any legal decisions C) Drink any alcoholic beverage   2) You may resume regular meals tomorrow.  Today it is better to start with liquids and gradually work up to solid foods.  You may eat anything you prefer, but it is better to start with liquids, then soup and crackers, and gradually work up to solid foods.   3) Please notify your doctor immediately if you have any unusual bleeding, trouble breathing, redness and pain at the surgery site, drainage, fever, or pain not relieved by medication.    4) Additional Instructions:        Please contact your physician with any problems or Same Day Surgery at [phone], Monday through Friday 6 am to 4 pm, or [place] [facility] Main number at [phone].   Vacuum-Assisted Closure Therapy Home Guide Vacuum-assisted closure therapy (VAC therapy) is a device that helps wounds heal. It is used on wounds that cannot be closed with stitches. They often heal slowly. VAC therapy helps the wound stay clean and healthy while its edges slowly grow back together. VAC therapy uses a bandage (dressing) that is made of foam. It is put inside the wound. Then, a drape is placed over the wound. This drape sticks to your skin (adhesive) to keep air out. A tube is hooked up to a small pump and is attached to the drape. The pump sucks fluid and germs from the wound. It can also decrease any bad smell that comes from the wound. RISKS AND COMPLICATIONS VAC therapy is usually safe to use at home. Your skin may get sore from the adhesive drape. That is the most common problem. However, more serious problems can develop, such as:   Bleeding. This can happen if the dressing in the wound comes into contact with blood vessels. A little bleeding may occur when the  dressing is being changed. This is normal now and then. Major bleeding can happen if a large blood vessel breaks. This is more likely if you are taking blood-thinning medicine. Emergency surgery may be needed.  Infection. This can happen if the dressing has an air leak that is not repaired within a couple of hours.  Dehydration. This can happen if the pump sucks out too much body fluid. DRESSING CHANGES Your dressing will have to be changed. Sometimes this is needed once a day. Other times, a dressing change must be done 3 times a week. How often you change your dressing will depend on what your wound is like. A trained caregiver will most likely change the dressing. However, a family member or friend may be trained to change the dressing. Below are steps to change a dressing in order to prevent an infection. The steps apply to you or the person that changes your dressing.  Wash your hands with soap and water before and after each dressing change.  Wear gloves and protective clothing. This may include eye protection.  Do not allow anyone to change your dressing if they have an infection or a skin condition. Even a small cut can be a problem. To change the dressing:   Turn off the pump.  Take off the adhesive drape.  Disconnect the tube from the dressing.  Take out the dressing that is inside the wound. If the dressing sticks,  use a germ-free (sterile), saltwater solution to wet the dressing. This helps it come out more easily. If it hurts when the dressing is changed, take pain medicine 30 minutes before the dressing change.  Cleanse the wound with normal saline or sterile water.  Apply a skin barrier film to the skin that will be covered with the drape. This will protect the skin.  Put a new dressing into the wound.  Apply a new drape and tube.  Replace the container in the pump that collects fluid if it is full. Do this at least once per week.  Turn the pump back on.  Your doctor  will decide what setting of suction is best. Do not change the settings on the machine without talking to your nurse or doctor. HOME CARE INSTRUCTIONS   The VAC pump has an alarm. It goes off if there are any problems such as a leak.  Ask your caregiver what to do if the alarm goes off.  Call your caregiver right away if the alarm goes off and you cannot fix the problem.  Do not turn off the pump for more than 2 hours.  Check your wound carefully at each dressing change for signs of infection. Watch for redness, swelling, or any fluid leaking from the wound. If you develop an infection:  You may have to stop VAC therapy.  The wound will need to be cleaned and washed out.  You will have to take antibiotic medicine.  Ask your caregiver what activities you should or should not do while you are getting VAC therapy. This will depend on your particular wound.  Ask if it is okay to turn off the pump so you can take a shower. If it is okay, make sure the wound is covered with plastic. The wound area must stay dry.  Drink enough fluids to keep your urine clear or pale yellow.  Eat foods that contain a lot of protein. Examples are meat, poultry, seafood, eggs, nuts, beans, and peas. Protein can help your wound heal. SEEK MEDICAL CARE IF:  Your wound itches or hurts.  Dressing changes are often painful or bleeding often occurs.  You have a headache.  You have diarrhea.  You have a sore throat.  You have a rash.  You feel nauseous.  You feel dizzy or weak. SEEK IMMEDIATE MEDICAL CARE IF:   You have very bad pain.  You have bleeding that will not stop.  Your wound smells bad.  You have redness, swelling, or fluid leaking from your wound.  Your alarm goes off and you do not know what to do.  You have a fever.   This information is not intended to replace advice given to you by your health care provider. Make sure you discuss any questions you have with your health care  provider.   Document Released: 08/10/2011 Document Reviewed: 08/10/2011 Elsevier Interactive Patient Education [employer]

## 2015-11-07 NOTE — H&P (Signed)
  [facility] History & Physical Update  The patient was interviewed and re-examined.  The patient's previous History and Physical has been reviewed and is unchanged.  There is no change in the plan of care. We plan to proceed with the scheduled procedure.  DEW,JASON, MD  11/07/2015, 2:23 PM

## 2015-11-07 NOTE — Op Note (Signed)
OPERATIVE NOTE   PROCEDURE: 1. Irrigation and debridement of right side of the abdominal wound for skin and soft tissue for approximately 20 cm. 2.  Irrigation and debridement of left side of the abdominal wound for skin and soft tissue for approximately 45 cm 3. Irrigation and debridement of right heel/Achilles wound of skin, soft tissue, and muscle for approximately 25 cm with negative pressure dressing placement 4. Right fourth toe ray amputation  PRE-OPERATIVE DIAGNOSIS: Nonviable tissue and infection of   2 abdominal wounds, right heel and Achilles area, and a gangrenous right fourth toe  POST-OPERATIVE DIAGNOSIS: Same as above  SURGEON: Festus Barren, MD  ASSISTANT(S): Raul Del, PA-C  ANESTHESIA: general  ESTIMATED BLOOD LOSS: 25 cc  FINDING(S): None  SPECIMEN(S):  Right fourth toe, right heel/Achilles tendon  INDICATIONS:   [name] is a 82 y.o. female who presents with gangrene of the right fourth toe and multiple other wounds. She has a large left abdominal wound, a medium-size right abdominal wound, and a pressure wound on her Achilles tendon and heel area. All of these appear nonviable and debridement is necessary. Risks and benefits were discussed..  DESCRIPTION: After obtaining full informed written consent, the patient was brought back to the operating room and placed supine upon the operating table.  The patient received IV antibiotics prior to induction.  After obtaining adequate anesthesia, the patient was prepped and draped in the standard fashion for extensive debridement.  The  right sided abdominal wound was then opened and excisional debridement was performed to the skin and soft tissue  to remove all clearly non-viable tissue.  The tissue was taken back to bleeding tissue that appeared viable.  The debridement was performed with Metzenbaum scissors and electrocautery  and encompassed an area of approximately 20  cm2.  The wound was irrigated  copiously with saline.  After all clearly non-viable tissue was removed, a wet-to-dry dressing was placed. We then turned our attention to the larger left abdominal wound. There is a fair amount of fat necrosis and nonviable subcutaneous tissue as well as skin that had to be debrided. This was done with electrocautery and Metzenbaum or Mayo scissors. This was taken back to bleeding and potentially viable tissue. This was a larger wound and was approximately 45 cm. The wound using irrigated with saline and a wet-to-dry dressing was placed.  We then turned our attention to the right heel and Achilles tendon area. In this location, there was clearly nonviable Achilles tendon as well as skin and soft tissue in the area. This was debrided again with electrocautery, Mayo scissors, and a 15 blade scalpel. This was taken back to more viable appearing tissue and encompassed approximately 25 cm. This wound was irrigated, and in this location a negative pressure dressing was felt to be the best option. The sponge was cut to fit the wound and occlusive seal was obtained over the wound. A good seal was obtained and suction was set at 125 cm.  After we had finished the excisional debridement of all 3 wounds, I turned my attention to the gangrenous right fourth toe. A curvilinear incision was created around the base of the toe. We then dissected down to the bone with electrocautery. The toe was removed from the metatarsal head. Ronjours were then used to take the metatarsal head back to allow a tension free closure. The deep tissue was closed with a figure-of-eight 2-0 Vicryl suture. The skin was closed with 4 mattress 4-0 nylon  sutures and a sterile dressing was placed  The patient was then awakened from anesthesia and taken to the recovery room in stable condition having tolerated the procedure well.  COMPLICATIONS: none  CONDITION: stable  DEW,JASON  11/07/2015, 3:59 PM

## 2015-11-07 NOTE — Anesthesia Postprocedure Evaluation (Signed)
Anesthesia Post Note  Patient: [name]  Procedure(s) Performed: Procedure(s) (LRB): AMPUTATION DIGIT ( 4TH TOE, RIGHT FOOT ) (Right) DEBRIDEMENT WOUND ( RIGHT HEEL AND ABDOMINAL WOUND (2) ) (Right)  Patient location during evaluation: PACU Anesthesia Type: General Level of consciousness: awake Pain management: pain level controlled Vital Signs Assessment: post-procedure vital signs reviewed and stable Respiratory status: respiratory function stable Cardiovascular status: stable Anesthetic complications: no    Last Vitals:  Filed Vitals:   11/07/15 1713 11/07/15 1732  BP: 115/49 116/49  Pulse: 72 73  Temp: 35.8 C   Resp: 16 16    Last Pain: There were no vitals filed for this visit.               VAN STAVEREN,Aleck Locklin

## 2015-11-07 NOTE — Anesthesia Procedure Notes (Signed)
Procedure Name: Intubation Date/Time: 11/07/2015 3:09 PM Performed by: Shirlee LimerickMARION, Natalie Porcaro Pre-anesthesia Checklist: Patient identified, Emergency Drugs available, Suction available and Patient being monitored Patient Re-evaluated:Patient Re-evaluated prior to inductionOxygen Delivery Method: Circle system utilized Preoxygenation: Pre-oxygenation with 100% oxygen Intubation Type: IV induction Laryngoscope Size: Mac and 3 Grade View: Grade I Tube type: Oral Tube size: 7.0 mm Number of attempts: 1 Placement Confirmation: ETT inserted through vocal cords under direct vision,  positive ETCO2 and breath sounds checked- equal and bilateral Secured at: 21 cm Tube secured with: Tape Dental Injury: Teeth and Oropharynx as per pre-operative assessment

## 2015-11-07 NOTE — Transfer of Care (Signed)
Immediate Anesthesia Transfer of Care Note  Patient: [name]  Procedure(s) Performed: Procedure(s): AMPUTATION DIGIT ( 4TH TOE, RIGHT FOOT ) (Right) DEBRIDEMENT WOUND ( RIGHT HEEL AND ABDOMINAL WOUND (2) ) (Right)  Patient Location: PACU  Anesthesia Type:General  Level of Consciousness: awake and patient cooperative  Airway & Oxygen Therapy: Patient Spontanous Breathing and Patient connected to nasal cannula oxygen  Post-op Assessment: Report given to RN and Post -op Vital signs reviewed and stable  Post vital signs: Reviewed and stable  Last Vitals:  Filed Vitals:   11/07/15 1350  BP: 104/56  Pulse: 76  Temp: 35.8 C  Resp: 16    Last Pain: There were no vitals filed for this visit.       Complications: No apparent anesthesia complications

## 2015-11-07 NOTE — Anesthesia Preprocedure Evaluation (Addendum)
Anesthesia Evaluation  Patient identified by MRN, date of birth, ID band Patient awake    Reviewed: Allergy & Precautions, NPO status , Patient's Chart, lab work & pertinent test results, reviewed documented beta blocker date and time   Airway Mallampati: II  TM Distance: >3 FB     Dental  (+) Chipped, Upper Dentures, Lower Dentures   Pulmonary shortness of breath, pneumonia, resolved,           Cardiovascular hypertension, Pt. on medications + Peripheral Vascular Disease and +CHF  + dysrhythmias Atrial Fibrillation      Neuro/Psych CVA, Residual Symptoms    GI/Hepatic GERD  Controlled,  Endo/Other  diabetes, Type 2  Renal/GU ESRFRenal disease     Musculoskeletal  (+) Arthritis ,   Abdominal   Peds  Hematology  (+) anemia ,   Anesthesia Other Findings RLS. Gout. Bladder interstem has been turned off since January. Anemic 9.7. K is 4.0. Very sensitive to fentanyl and versed combo.  Reproductive/Obstetrics                           Anesthesia Physical Anesthesia Plan  ASA: III  Anesthesia Plan: General   Post-op Pain Management:    Induction: Intravenous  Airway Management Planned: LMA  Additional Equipment:   Intra-op Plan:   Post-operative Plan:   Informed Consent: I have reviewed the patients History and Physical, chart, labs and discussed the procedure including the risks, benefits and alternatives for the proposed anesthesia with the patient or authorized representative who has indicated his/her understanding and acceptance.     Plan Discussed with: CRNA  Anesthesia Plan Comments:         Anesthesia Quick Evaluation

## 2015-11-08 ENCOUNTER — Encounter: Payer: Self-pay | Admitting: Vascular Surgery

## 2015-11-11 ENCOUNTER — Ambulatory Visit: Payer: Medicare (Managed Care) | Admitting: Surgery

## 2015-11-11 LAB — SURGICAL PATHOLOGY

## 2015-11-19 ENCOUNTER — Emergency Department
Admission: EM | Admit: 2015-11-19 | Discharge: 2015-11-19 | Disposition: A | Discharge status: Home / Self Care | Payer: Medicare Other | Attending: Emergency Medicine | Admitting: Emergency Medicine

## 2015-11-19 DIAGNOSIS — Z79899 Other long term (current) drug therapy: Secondary | ICD-10-CM | POA: Diagnosis not present

## 2015-11-19 DIAGNOSIS — I509 Heart failure, unspecified: Secondary | ICD-10-CM | POA: Diagnosis not present

## 2015-11-19 DIAGNOSIS — X58XXXD Exposure to other specified factors, subsequent encounter: Secondary | ICD-10-CM | POA: Diagnosis not present

## 2015-11-19 DIAGNOSIS — Y929 Unspecified place or not applicable: Secondary | ICD-10-CM | POA: Insufficient documentation

## 2015-11-19 DIAGNOSIS — Y999 Unspecified external cause status: Secondary | ICD-10-CM | POA: Insufficient documentation

## 2015-11-19 DIAGNOSIS — S31109D Unspecified open wound of abdominal wall, unspecified quadrant without penetration into peritoneal cavity, subsequent encounter: Secondary | ICD-10-CM | POA: Insufficient documentation

## 2015-11-19 DIAGNOSIS — I132 Hypertensive heart and chronic kidney disease with heart failure and with stage 5 chronic kidney disease, or end stage renal disease: Secondary | ICD-10-CM | POA: Diagnosis not present

## 2015-11-19 DIAGNOSIS — Z96642 Presence of left artificial hip joint: Secondary | ICD-10-CM | POA: Diagnosis not present

## 2015-11-19 DIAGNOSIS — Z96641 Presence of right artificial hip joint: Secondary | ICD-10-CM | POA: Insufficient documentation

## 2015-11-19 DIAGNOSIS — Z8679 Personal history of other diseases of the circulatory system: Secondary | ICD-10-CM | POA: Insufficient documentation

## 2015-11-19 DIAGNOSIS — Z5189 Encounter for other specified aftercare: Secondary | ICD-10-CM

## 2015-11-19 DIAGNOSIS — S91302D Unspecified open wound, left foot, subsequent encounter: Secondary | ICD-10-CM | POA: Diagnosis not present

## 2015-11-19 DIAGNOSIS — T148XXA Other injury of unspecified body region, initial encounter: Secondary | ICD-10-CM

## 2015-11-19 DIAGNOSIS — I270 Primary pulmonary hypertension: Secondary | ICD-10-CM | POA: Insufficient documentation

## 2015-11-19 DIAGNOSIS — E1122 Type 2 diabetes mellitus with diabetic chronic kidney disease: Secondary | ICD-10-CM | POA: Insufficient documentation

## 2015-11-19 DIAGNOSIS — Y939 Activity, unspecified: Secondary | ICD-10-CM | POA: Insufficient documentation

## 2015-11-19 DIAGNOSIS — Z8673 Personal history of transient ischemic attack (TIA), and cerebral infarction without residual deficits: Secondary | ICD-10-CM | POA: Insufficient documentation

## 2015-11-19 DIAGNOSIS — Z794 Long term (current) use of insulin: Secondary | ICD-10-CM | POA: Diagnosis not present

## 2015-11-19 DIAGNOSIS — I482 Chronic atrial fibrillation: Secondary | ICD-10-CM | POA: Insufficient documentation

## 2015-11-19 DIAGNOSIS — N186 End stage renal disease: Secondary | ICD-10-CM | POA: Insufficient documentation

## 2015-11-19 LAB — BASIC METABOLIC PANEL
Anion gap: 11 (ref 5–15)
BUN: 26 mg/dL — ABNORMAL HIGH (ref 6–20)
CALCIUM: 8.3 mg/dL — AB (ref 8.9–10.3)
CO2: 25 mmol/L (ref 22–32)
CREATININE: 2.88 mg/dL — AB (ref 0.44–1.00)
Chloride: 100 mmol/L — ABNORMAL LOW (ref 101–111)
GFR calc non Af Amer: 14 mL/min — ABNORMAL LOW (ref >60)
GFR, EST AFRICAN AMERICAN: 17 mL/min — AB (ref >60)
GLUCOSE: 163 mg/dL — AB (ref 65–99)
Potassium: 3.6 mmol/L (ref 3.5–5.1)
Sodium: 136 mmol/L (ref 135–145)

## 2015-11-19 LAB — CBC WITH DIFFERENTIAL/PLATELET
Basophils Absolute: 0.1 10*3/uL (ref 0–0.1)
Eosinophils Absolute: 0.1 10*3/uL (ref 0–0.7)
HEMATOCRIT: 31.3 % — AB (ref 35.0–47.0)
Hemoglobin: 10 g/dL — ABNORMAL LOW (ref 12.0–16.0)
Lymphocytes Relative: 6 %
Lymphs Abs: 0.8 10*3/uL — ABNORMAL LOW (ref 1.0–3.6)
MCH: 29.5 pg (ref 26.0–34.0)
MCHC: 32 g/dL (ref 32.0–36.0)
MCV: 92 fL (ref 80.0–100.0)
MONO ABS: 1.4 10*3/uL — AB (ref 0.2–0.9)
NEUTROS ABS: 11.4 10*3/uL — AB (ref 1.4–6.5)
Neutrophils Relative %: 82 %
PLATELETS: 311 10*3/uL (ref 150–440)
RBC: 3.4 MIL/uL — ABNORMAL LOW (ref 3.80–5.20)
RDW: 18.2 % — AB (ref 11.5–14.5)
WBC: 13.7 10*3/uL — ABNORMAL HIGH (ref 3.6–11.0)

## 2015-11-19 MED ORDER — CEPHALEXIN 500 MG PO CAPS: 500.0000 mg | ORAL_CAPSULE | Freq: Three times a day (TID) | ORAL | Status: DC

## 2015-11-19 MED ORDER — CEPHALEXIN 500 MG PO CAPS
500.0000 mg | ORAL_CAPSULE | Freq: Once | ORAL | Status: AC
  Administered 2015-11-19: 500 mg via ORAL
  Filled 2015-11-19: qty 1

## 2015-11-19 NOTE — ED Provider Notes (Signed)
[facility] Emergency Department Provider Note   ____________________________________________  Time seen: Approximately 12:36 AM  I have reviewed the triage vital signs and the nursing notes.   HISTORY  Chief Complaint Wound Check    HPI [name] is a 82 y.o. female who presents to the ED from home via EMS with a chief complaint of bleeding from wound. Patient had the following procedures performed by vascular surgeon Dr. Wyn Quaker on6/12/2015: 1. Irrigation and debridement of right side of the abdominal wound for skin and soft tissue for approximately 20 cm. 2. Irrigation and debridement of left side of the abdominal wound for skin and soft tissue for approximately 45 cm 3. Irrigation and debridement of right heel/Achilles wound of skin, soft tissue, and muscle for approximately 25 cm with negative pressure dressing placement   4. Right fourth toe ray amputation  Prior to arrival her daughter was about to place her wound VAC onto her right heel when patient ambulated to the restroom and struck the heel on something which caused bleeding. Patient arrives with dressing in place. Since her surgery, patient's daughter who is a Engineer, civil (consulting) as well as home health has been changing the dressings to her abdominal and foot/heel wounds. Patient notes odor to abdominal wounds today. Denies fever, chills, chest pain, shortness of breath, abdominal pain, nausea, vomiting, diarrhea. Received dialysis yesterday. Nothing makes her symptoms better or worse.   Past Medical History  Diagnosis Date  . Chronic kidney disease   . Dysrhythmia   . Diabetes mellitus without complication (HCC)   . Hypertension   . Stroke (HCC)   . Arthritis     gout  . Dialysis patient (HCC)   . Pleural effusion   . Pulmonary hypertension (HCC)   . Renal insufficiency   . CHF (congestive heart failure) (HCC)   . Peripheral vascular disease (HCC)   . Shortness of breath dyspnea     with  exertion  . GERD (gastroesophageal reflux disease)   . Anemia   . Restless leg syndrome   . Atrial fibrillation [facility])     Patient Active Problem List   Diagnosis Date Noted  . Hematoma 10/12/2015  . Leg pain, right 09/14/2015  . PVD (peripheral vascular disease) with claudication (HCC) 09/14/2015  . Pneumonia 08/13/2015  . Carotid stenosis 07/26/2015  . Amputation of fifth toe, left, traumatic (HCC) 06/24/2015  . Diabetes mellitus (HCC) 06/23/2015  . Diabetic neuropathy (HCC) 06/23/2015  . Chronic atrial fibrillation (HCC) 06/23/2015  . Left carotid artery stenosis 06/23/2015  . CVA (cerebral infarction) 06/21/2015  . Pressure ulcer 06/19/2015  . End stage renal disease (HCC) 06/18/2015  . Respiratory distress 12/11/2014    Past Surgical History  Procedure Laterality Date  . Cholecystectomy    . Peripheral vascular catheterization N/A 10/08/2014    Procedure: A/V Shuntogram/Fistulagram;  Surgeon: Annice Needy, MD;  Location: [facility] INVASIVE CV LAB;  Service: Cardiovascular;  Laterality: N/A;  . Joint replacement      bilateral hip  . Peripheral vascular catheterization N/A 03/04/2015    Procedure: A/V Shuntogram/Fistulagram;  Surgeon: Annice Needy, MD;  Location: [facility] INVASIVE CV LAB;  Service: Cardiovascular;  Laterality: N/A;  . Peripheral vascular catheterization N/A 03/04/2015    Procedure: A/V Shunt Intervention;  Surgeon: Annice Needy, MD;  Location: [facility] INVASIVE CV LAB;  Service: Cardiovascular;  Laterality: N/A;  . Fistulagram ([facility] hx)    . Parathyroidectomy    . Tonsillectomy    . Cardiac  catheterization    . Eye surgery    . Cataract extraction, bilateral    . Amputation toe Left 06/07/2015    Procedure: AMPUTATION TOE;  Surgeon: Gwyneth Revels, DPM;  Location: [facility] ORS;  Service: Podiatry;  Laterality: Left;  Marland Kitchen [employer] bladder interstem      currently turned off for MRI in January  . Endarterectomy Left 07/26/2015    Procedure: ENDARTERECTOMY CAROTID;  Surgeon:  Renford Dills, MD;  Location: [facility] ORS;  Service: Vascular;  Laterality: Left;  . Thyroidectomy, partial    . Peripheral vascular catheterization N/A 09/16/2015    Procedure: Lower Extremity Angiography;  Surgeon: Annice Needy, MD;  Location: [facility] INVASIVE CV LAB;  Service: Cardiovascular;  Laterality: N/A;  . Peripheral vascular catheterization  09/16/2015    Procedure: Lower Extremity Intervention;  Surgeon: Annice Needy, MD;  Location: [facility] INVASIVE CV LAB;  Service: Cardiovascular;;  . Amputation Right 11/07/2015    Procedure: AMPUTATION DIGIT ( 4TH TOE, RIGHT FOOT );  Surgeon: Annice Needy, MD;  Location: [facility] ORS;  Service: Vascular;  Laterality: Right;  . Wound debridement Right 11/07/2015    Procedure: DEBRIDEMENT WOUND ( RIGHT HEEL AND ABDOMINAL WOUND (2) );  Surgeon: Annice Needy, MD;  Location: [facility] ORS;  Service: Vascular;  Laterality: Right;    Current Outpatient Rx  Name  Route  Sig  Dispense  Refill  . acetaminophen (TYLENOL) 650 MG CR tablet   Oral   Take 1,300 mg by mouth 2 (two) times daily.         Marland Kitchen albuterol (PROVENTIL HFA;VENTOLIN HFA) 108 (90 Base) MCG/ACT inhaler   Inhalation   Inhale 1-2 puffs into the lungs every 6 (six) hours as needed for wheezing or shortness of breath.         . Biotin 1 MG CAPS   Oral   Take 1 mg by mouth daily.          . bisacodyl (DULCOLAX) 5 MG EC tablet   Oral   Take 10 mg by mouth daily as needed for moderate constipation.          . calcium acetate, Phos Binder, (PHOSLYRA) 667 MG/5ML SOLN   Oral   Take 1,334 mg by mouth 3 (three) times daily with meals.         . cephALEXin (KEFLEX) 500 MG capsule   Oral   Take 1 capsule (500 mg total) by mouth 3 (three) times daily.   21 capsule   0   . diltiazem (TIAZAC) 120 MG 24 hr capsule   Oral   Take 120 mg by mouth every morning.          . diphenhydrAMINE (BENADRYL) 25 mg capsule   Oral   Take 1 capsule (25 mg total) by mouth every 6 (six) hours as needed (rash,  itching).   30 capsule   0   . folic acid-vitamin b complex-vitamin c-selenium-zinc (DIALYVITE) 3 MG TABS tablet   Oral   Take 1 tablet by mouth daily.         . furosemide (LASIX) 80 MG tablet   Oral   Take 80 mg by mouth 2 (two) times daily.         Marland Kitchen gabapentin (NEURONTIN) 100 MG capsule   Oral   Take 100 mg by mouth at bedtime. Pt takes on Sunday, Monday, Wednesday, and Friday.         . gabapentin (NEURONTIN) 300 MG capsule   Oral  Take 300 mg by mouth daily. Pt takes on Tuesday, Thursday, and Saturday after dialysis.         Marland Kitchen insulin detemir (LEVEMIR) 100 UNIT/ML injection   Subcutaneous   Inject 0.11 mLs (11 Units total) into the skin daily. Patient taking differently: Inject 21 Units into the skin daily. Divided dose 11 units in am, 10 units in pm   10 mL   11   . lidocaine-prilocaine (EMLA) cream   Topical   Apply 1 application topically as needed (prior to accessing port).          . nystatin (MYCOSTATIN/NYSTOP) 100000 UNIT/GM POWD   Topical   Apply topically 2 (two) times daily as needed.         . Omega-3 Fatty Acids (FISH OIL) 1200 MG CAPS   Oral   Take 1,200 mg by mouth daily.         Marland Kitchen PARoxetine (PAXIL) 40 MG tablet   Oral   Take 40 mg by mouth at bedtime.          . pravastatin (PRAVACHOL) 20 MG tablet   Oral   Take 1 tablet (20 mg total) by mouth daily. Patient taking differently: Take 20 mg by mouth at bedtime.    30 tablet   5   . ranitidine (ZANTAC) 150 MG tablet   Oral   Take 150 mg by mouth daily.          Marland Kitchen senna-docusate (SENOKOT-S) 8.6-50 MG tablet   Oral   Take 1 tablet by mouth 2 (two) times daily as needed for mild constipation or moderate constipation.          . traMADol (ULTRAM) 50 MG tablet   Oral   Take 1 tablet (50 mg total) by mouth every 6 (six) hours as needed for moderate pain.   30 tablet   0   . traMADol (ULTRAM) 50 MG tablet   Oral   Take 1 tablet (50 mg total) by mouth every 6 (six)  hours as needed.   20 tablet   0   . warfarin (COUMADIN) 2 MG tablet   Oral   Take 1 tablet (2 mg total) by mouth daily.   20 tablet   0     Allergies Codeine; Contrast media; Oxycodone; and Quinine derivatives  Family History  Problem Relation Age of Onset  . Colon cancer Mother   . Stroke Father   . Diabetes Brother     Social History Social History  Substance Use Topics  . Smoking status: Never Smoker   . Smokeless tobacco: Never Used  . Alcohol Use: No    Review of Systems  Constitutional: No fever/chills. Eyes: No visual changes. ENT: No sore throat. Cardiovascular: Denies chest pain. Respiratory: Denies shortness of breath. Gastrointestinal: No abdominal pain.  No nausea, no vomiting.  No diarrhea.  No constipation. Genitourinary: Negative for dysuria. Musculoskeletal: Positive for bleeding from right heel wound. Negative for back pain. Skin: Negative for rash. Neurological: Negative for headaches, focal weakness or numbness.  10-point ROS otherwise negative.  ____________________________________________   PHYSICAL EXAM:  VITAL SIGNS: ED Triage Vitals  Enc Vitals Group     BP 11/19/15 0029 134/72 mmHg     Pulse Rate 11/19/15 0029 86     Resp 11/19/15 0029 16     Temp 11/19/15 0029 98 F (36.7 C)     Temp Source 11/19/15 0029 Oral     SpO2 11/19/15 0029 100 %  Weight 11/19/15 0029 155 lb (70.308 kg)     Height 11/19/15 0029 5\' 7"  (1.702 m)     Head Cir --      Peak Flow --      Pain Score 11/19/15 0031 5     Pain Loc --      Pain Edu? --      Excl. in GC? --     Constitutional: Alert and oriented. Well appearing and in no acute distress. Eyes: Conjunctivae are normal. PERRL. EOMI. Head: Atraumatic. Nose: No congestion/rhinnorhea. Mouth/Throat: Mucous membranes are moist.  Oropharynx non-erythematous. Neck: No stridor.   Cardiovascular: Normal rate, regular rhythm. Grossly normal heart sounds.  Good peripheral  circulation. Respiratory: Normal respiratory effort.  No retractions. Lungs CTAB. Gastrointestinal: 2 abdominal wounds which have wet to dry dressings. No purulence noted. Slight foul odor to both wounds. Soft and nontender. No distention. No abdominal bruits. No CVA tenderness. Musculoskeletal:  Right heel wound nonbleeding with small clot to medial aspect. Wound appears to be healing well. Neurologic:  Normal speech and language. No gross focal neurologic deficits are appreciated.  Skin:  Skin is warm, dry and intact. No rash noted. Psychiatric: Mood and affect are normal. Speech and behavior are normal.  ____________________________________________   LABS (all labs ordered are listed, but only abnormal results are displayed)  Labs Reviewed  CBC WITH DIFFERENTIAL/PLATELET - Abnormal; Notable for the following:    WBC 13.7 (*)    RBC 3.40 (*)    Hemoglobin 10.0 (*)    HCT 31.3 (*)    RDW 18.2 (*)    Neutro Abs 11.4 (*)    Lymphs Abs 0.8 (*)    Monocytes Absolute 1.4 (*)    All other components within normal limits  BASIC METABOLIC PANEL - Abnormal; Notable for the following:    Chloride 100 (*)    Glucose, Bld 163 (*)    BUN 26 (*)    Creatinine, Ser 2.88 (*)    Calcium 8.3 (*)    GFR calc non Af Amer 14 (*)    GFR calc Af Amer 17 (*)    All other components within normal limits  AEROBIC/ANAEROBIC CULTURE (SURGICAL/DEEP WOUND)   ____________________________________________  EKG  None ____________________________________________  RADIOLOGY  None ____________________________________________   PROCEDURES  Procedure(s) performed: None  Critical Care performed: No  ____________________________________________   INITIAL IMPRESSION / ASSESSMENT AND PLAN / ED COURSE  Pertinent labs & imaging results that were available during my care of the patient were reviewed by me and considered in my medical decision making (see chart for details).  82 year old female who  presents with bleeding wound after accidentally striking heel. Wound is currently not bleeding. Given patient's complaint of malodorous smell from abdominal wounds, will obtain screening labwork and wound culture.  ----------------------------------------- 2:43 AM on 11/19/2015 -----------------------------------------  No bleeding wall patient was in the emergency department. Updated patient and daughter of laboratory results. Will start treatment with Keflex given that patient is a dialysis patient; they were informed that they will be notified with any positive wound culture results. Patient has a scheduled follow-up appointment with Dr. Wyn Quaker in 2 days; daughter will call this morning to see if he wants to see her sooner in the office. I advised wet-to-dry dressing to the right heel for tonight. Advised her that she may resume her wound back in the afternoon. Strict return precautions given. Both verbalize understanding and agree with plan of care. ____________________________________________   FINAL CLINICAL  IMPRESSION(S) / ED DIAGNOSES  Final diagnoses:  Encounter for wound care  Bleeding from wound (HCC)      NEW MEDICATIONS STARTED DURING THIS VISIT:  New Prescriptions   CEPHALEXIN (KEFLEX) 500 MG CAPSULE    Take 1 capsule (500 mg total) by mouth 3 (three) times daily.     Note:  This document was prepared using Dragon voice recognition software and may include unintentional dictation errors.    Irean HongJade J Abdur Hoglund, MD 11/19/15 [phone]

## 2015-11-19 NOTE — ED Notes (Signed)
Pt arrives to ED via [facility] from home d/t bleeding from wound site on RIGHT calf and RIGHT heel. EMS reports pt had a RIGHT foot toe amputation x2 weeks ago, with wound vac in place. EMS reports pt's daughter removed wound vac that was in place and placed sterile gauze to control bleeding. Pt is A&O, in NAD, with respirations even, regular and unlabored. EMS reports pt does dialysis on M/W/F, with last t/x being yesterday. EMS states pt's VS en route as BP 150/68, HR 90, 98% O2 on RA.

## 2015-11-19 NOTE — Discharge Instructions (Signed)
1. Take antibiotic as prescribed (Keflex  three times daily x 7 days). 2. You will be notified of any positive wound culture results. 3. You may resume wound vac in the afternoon. 4. Return to the ER for worsening symptoms, persistent vomiting, fever or other concerns.  Wound Debridement, Care After Refer to this sheet in the next few weeks. These instructions provide you with information on caring for yourself after your procedure. Your caregiver may also give you more specific instructions. Your treatment has been planned according to current medical practices, but problems sometimes occur. Call your caregiver if you have any problems or questions after your procedure.  HOME CARE INSTRUCTIONS   Only take over-the-counter or prescription medicines as directed by your caregiver. Do not take any other medicines without asking your caregiver first.  If antibiotic medicines are prescribed, take them as directed. Finish them even if you start to feel better.  Care for your wound as directed by your caregiver.  Wash your hands thoroughly before and after changing your dressings.  Keep the wound and bandages (dressings) clean and dry. Change dressings as directed.  Apply any prescribed medicines to the wound as directed. Do not use anything else without asking your caregiver first.  Do not shower or bathe until your caregiver approves. Take sponge baths until you get approval.  Limit activities or movements as directed by your caregiver.  Protect the wound from further injury until it is healed.  Do not drive until you are no longer taking pain medicine or until your caregiver approves.  Keep all follow-up appointments.  SEEK MEDICAL CARE IF:   You have pain even after taking pain medicine.  You have trouble changing your dressings. SEEK IMMEDIATE MEDICAL CARE IF:  Your pain increases.   You have a fever. MAKE SURE YOU:  Understand these instructions.  Will watch your  condition.  Will get help right away if you are not doing well or get worse.   This information is not intended to replace advice given to you by your health care provider. Make sure you discuss any questions you have with your health care provider.   Document Released: 05/04/2012 Document Revised: 06/08/2014 Document Reviewed: 09/26/2014 Elsevier Interactive Patient Education 2016 [employer]  Wound Care Taking care of your wound properly can help to prevent pain and infection. It can also help your wound to heal more quickly.  HOW TO CARE FOR YOUR WOUND  Take or apply over-the-counter and prescription medicines only as told by your health care provider.  If you were prescribed antibiotic medicine, take or apply it as told by your health care provider. Do not stop using the antibiotic even if your condition improves.  Clean the wound each day or as told by your health care provider.  Wash the wound with mild soap and water.  Rinse the wound with water to remove all soap.  Pat the wound dry with a clean towel. Do not rub it.  There are many different ways to close and cover a wound. For example, a wound can be covered with stitches (sutures), skin glue, or adhesive strips. Follow instructions from your health care provider about:  How to take care of your wound.  When and how you should change your bandage (dressing).  When you should remove your dressing.  Removing whatever was used to close your wound.  Check your wound every day for signs of infection. Watch for:  Redness, swelling, or pain.  Fluid, blood, or  pus.  Keep the dressing dry until your health care provider says it can be removed. Do not take baths, swim, use a hot tub, or do anything that would put your wound underwater until your health care provider approves.  Raise (elevate) the injured area above the level of your heart while you are sitting or lying down.  Do not scratch or pick at the  wound.  Keep all follow-up visits as told by your health care provider. This is important. SEEK MEDICAL CARE IF:  You received a tetanus shot and you have swelling, severe pain, redness, or bleeding at the injection site.  You have a fever.  Your pain is not controlled with medicine.  You have increased redness, swelling, or pain at the site of your wound.  You have fluid, blood, or pus coming from your wound.  You notice a bad smell coming from your wound or your dressing. SEEK IMMEDIATE MEDICAL CARE IF:  You have a red streak going away from your wound.   This information is not intended to replace advice given to you by your health care provider. Make sure you discuss any questions you have with your health care provider.   Document Released: 02/25/2008 Document Revised: 10/02/2014 Document Reviewed: 05/14/2014 Elsevier Interactive Patient Education [employer] Else[employer] Inc[employer]

## 2015-11-25 LAB — AEROBIC/ANAEROBIC CULTURE W GRAM STAIN (SURGICAL/DEEP WOUND)

## 2015-11-25 LAB — AEROBIC/ANAEROBIC CULTURE (SURGICAL/DEEP WOUND)

## 2015-11-26 ENCOUNTER — Other Ambulatory Visit: Payer: Self-pay | Admitting: Vascular Surgery

## 2015-11-26 ENCOUNTER — Encounter
Admission: RE | Admit: 2015-11-26 | Discharge: 2015-11-26 | Disposition: A | Discharge status: Home / Self Care | Payer: Medicare Other | Source: Ambulatory Visit | Attending: Vascular Surgery | Admitting: Vascular Surgery

## 2015-11-26 DIAGNOSIS — I4891 Unspecified atrial fibrillation: Secondary | ICD-10-CM | POA: Diagnosis not present

## 2015-11-26 DIAGNOSIS — N186 End stage renal disease: Secondary | ICD-10-CM | POA: Diagnosis present

## 2015-11-26 DIAGNOSIS — E1122 Type 2 diabetes mellitus with diabetic chronic kidney disease: Secondary | ICD-10-CM | POA: Diagnosis not present

## 2015-11-26 DIAGNOSIS — K219 Gastro-esophageal reflux disease without esophagitis: Secondary | ICD-10-CM | POA: Diagnosis not present

## 2015-11-26 DIAGNOSIS — I739 Peripheral vascular disease, unspecified: Secondary | ICD-10-CM | POA: Diagnosis not present

## 2015-11-26 DIAGNOSIS — L0889 Other specified local infections of the skin and subcutaneous tissue: Secondary | ICD-10-CM | POA: Diagnosis not present

## 2015-11-26 DIAGNOSIS — Z862 Personal history of diseases of the blood and blood-forming organs and certain disorders involving the immune mechanism: Secondary | ICD-10-CM | POA: Diagnosis not present

## 2015-11-26 DIAGNOSIS — Z8673 Personal history of transient ischemic attack (TIA), and cerebral infarction without residual deficits: Secondary | ICD-10-CM | POA: Diagnosis not present

## 2015-11-26 DIAGNOSIS — I509 Heart failure, unspecified: Secondary | ICD-10-CM | POA: Diagnosis not present

## 2015-11-26 DIAGNOSIS — I132 Hypertensive heart and chronic kidney disease with heart failure and with stage 5 chronic kidney disease, or end stage renal disease: Secondary | ICD-10-CM | POA: Diagnosis not present

## 2015-11-26 LAB — BASIC METABOLIC PANEL
Anion gap: 12 (ref 5–15)
BUN: 37 mg/dL — ABNORMAL HIGH (ref 6–20)
CHLORIDE: 97 mmol/L — AB (ref 101–111)
CO2: 25 mmol/L (ref 22–32)
CREATININE: 3.84 mg/dL — AB (ref 0.44–1.00)
Calcium: 8.6 mg/dL — ABNORMAL LOW (ref 8.9–10.3)
GFR calc non Af Amer: 10 mL/min — ABNORMAL LOW (ref >60)
GFR, EST AFRICAN AMERICAN: 12 mL/min — AB (ref >60)
Glucose, Bld: 88 mg/dL (ref 65–99)
Potassium: 3.4 mmol/L — ABNORMAL LOW (ref 3.5–5.1)
Sodium: 134 mmol/L — ABNORMAL LOW (ref 135–145)

## 2015-11-26 LAB — PROTIME-INR
INR: 3.21
PROTHROMBIN TIME: 32.2 s — AB (ref 11.4–15.0)

## 2015-11-26 LAB — CBC WITH DIFFERENTIAL/PLATELET
Basophils Absolute: 0.1 10*3/uL (ref 0–0.1)
Basophils Relative: 1 %
EOS ABS: 0 10*3/uL (ref 0–0.7)
Eosinophils Relative: 0 %
HEMATOCRIT: 31.3 % — AB (ref 35.0–47.0)
HEMOGLOBIN: 10 g/dL — AB (ref 12.0–16.0)
LYMPHS ABS: 0.6 10*3/uL — AB (ref 1.0–3.6)
Lymphocytes Relative: 5 %
MCH: 29.2 pg (ref 26.0–34.0)
MCHC: 32 g/dL (ref 32.0–36.0)
MCV: 91 fL (ref 80.0–100.0)
Monocytes Absolute: 0.8 10*3/uL (ref 0.2–0.9)
NEUTROS ABS: 10.4 10*3/uL — AB (ref 1.4–6.5)
Platelets: 353 10*3/uL (ref 150–440)
RBC: 3.44 MIL/uL — ABNORMAL LOW (ref 3.80–5.20)
RDW: 18.2 % — ABNORMAL HIGH (ref 11.5–14.5)
WBC: 11.9 10*3/uL — ABNORMAL HIGH (ref 3.6–11.0)

## 2015-11-26 LAB — APTT: aPTT: 75 seconds — ABNORMAL HIGH (ref 24–36)

## 2015-11-26 LAB — TYPE AND SCREEN
ABO/RH(D): O POS
Antibody Screen: NEGATIVE
EXTEND SAMPLE REASON: TRANSFUSED

## 2015-11-26 NOTE — Pre-Procedure Instructions (Signed)
Patient arrived at [facility] for pre-op appointment stating "I was just here three weeks ago and nothing has changed." Dr. Wyn Quakerew office notified (spoke to [place]) and requested to obtain labs , stop coumadin today , and patient did not need visit. Sage wipes, medication instructions, NPO given to daughter, and patient with understanding.

## 2015-11-27 NOTE — Pre-Procedure Instructions (Signed)
Labs faxed to Dr. Wyn Quakerew for review.  Pt is on Coumadin, stopped yesterday at office. Will recheck PT, PTT day of surgery.

## 2015-11-28 ENCOUNTER — Encounter: Payer: Self-pay | Admitting: *Deleted

## 2015-11-28 ENCOUNTER — Encounter
Admission: RE | Disposition: A | Discharge status: Home / Self Care | Payer: Self-pay | Source: Ambulatory Visit | Attending: Vascular Surgery

## 2015-11-28 ENCOUNTER — Ambulatory Visit: Payer: Medicare Other | Admitting: Anesthesiology

## 2015-11-28 ENCOUNTER — Ambulatory Visit
Admission: RE | Admit: 2015-11-28 | Discharge: 2015-11-28 | Disposition: A | Discharge status: Home / Self Care | Payer: Medicare Other | Source: Ambulatory Visit | Attending: Vascular Surgery | Admitting: Vascular Surgery

## 2015-11-28 DIAGNOSIS — Z862 Personal history of diseases of the blood and blood-forming organs and certain disorders involving the immune mechanism: Secondary | ICD-10-CM | POA: Insufficient documentation

## 2015-11-28 DIAGNOSIS — E1122 Type 2 diabetes mellitus with diabetic chronic kidney disease: Secondary | ICD-10-CM | POA: Insufficient documentation

## 2015-11-28 DIAGNOSIS — N186 End stage renal disease: Secondary | ICD-10-CM | POA: Insufficient documentation

## 2015-11-28 DIAGNOSIS — Z8673 Personal history of transient ischemic attack (TIA), and cerebral infarction without residual deficits: Secondary | ICD-10-CM | POA: Insufficient documentation

## 2015-11-28 DIAGNOSIS — L0889 Other specified local infections of the skin and subcutaneous tissue: Secondary | ICD-10-CM | POA: Insufficient documentation

## 2015-11-28 DIAGNOSIS — I739 Peripheral vascular disease, unspecified: Secondary | ICD-10-CM | POA: Insufficient documentation

## 2015-11-28 DIAGNOSIS — I4891 Unspecified atrial fibrillation: Secondary | ICD-10-CM | POA: Insufficient documentation

## 2015-11-28 DIAGNOSIS — I509 Heart failure, unspecified: Secondary | ICD-10-CM | POA: Insufficient documentation

## 2015-11-28 DIAGNOSIS — L8990 Pressure ulcer of unspecified site, unspecified stage: Secondary | ICD-10-CM

## 2015-11-28 DIAGNOSIS — I132 Hypertensive heart and chronic kidney disease with heart failure and with stage 5 chronic kidney disease, or end stage renal disease: Secondary | ICD-10-CM | POA: Insufficient documentation

## 2015-11-28 DIAGNOSIS — K219 Gastro-esophageal reflux disease without esophagitis: Secondary | ICD-10-CM | POA: Insufficient documentation

## 2015-11-28 HISTORY — PX: WOUND DEBRIDEMENT: SHX247

## 2015-11-28 LAB — APTT: aPTT: 50 seconds — ABNORMAL HIGH (ref 24–36)

## 2015-11-28 LAB — PROTIME-INR
INR: 2.05
Prothrombin Time: 23 seconds — ABNORMAL HIGH (ref 11.4–15.0)

## 2015-11-28 LAB — GLUCOSE, CAPILLARY: Glucose-Capillary: 107 mg/dL — ABNORMAL HIGH (ref 65–99)

## 2015-11-28 SURGERY — DEBRIDEMENT, WOUND
Anesthesia: General | Wound class: Dirty or Infected

## 2015-11-28 MED ORDER — FENTANYL CITRATE (PF) 100 MCG/2ML IJ SOLN
INTRAMUSCULAR | Status: DC | PRN
  Administered 2015-11-28: 100 ug via INTRAVENOUS

## 2015-11-28 MED ORDER — TRAMADOL HCL 50 MG PO TABS: 50.0000 mg | ORAL_TABLET | Freq: Four times a day (QID) | ORAL | Status: DC | PRN

## 2015-11-28 MED ORDER — PROPOFOL 10 MG/ML IV BOLUS
INTRAVENOUS | Status: DC | PRN
  Administered 2015-11-28: 130 mg via INTRAVENOUS

## 2015-11-28 MED ORDER — ONDANSETRON HCL 4 MG/2ML IJ SOLN
INTRAMUSCULAR | Status: DC | PRN
  Administered 2015-11-28: 4 mg via INTRAVENOUS

## 2015-11-28 MED ORDER — CHLORHEXIDINE GLUCONATE CLOTH 2 % EX PADS: 6.0000 | MEDICATED_PAD | Freq: Once | CUTANEOUS | Status: DC

## 2015-11-28 MED ORDER — PHENYLEPHRINE HCL 10 MG/ML IJ SOLN
INTRAMUSCULAR | Status: DC | PRN
  Administered 2015-11-28 (×2): 100 ug via INTRAVENOUS

## 2015-11-28 MED ORDER — CEFAZOLIN SODIUM-DEXTROSE 2-4 GM/100ML-% IV SOLN: 2.0000 g | INTRAVENOUS | Status: DC

## 2015-11-28 MED ORDER — ONDANSETRON HCL 4 MG/2ML IJ SOLN: 4.0000 mg | Freq: Once | INTRAMUSCULAR | Status: DC | PRN

## 2015-11-28 MED ORDER — SODIUM CHLORIDE 0.9 % IV SOLN: INTRAVENOUS | Status: DC

## 2015-11-28 MED ORDER — CEFAZOLIN SODIUM-DEXTROSE 2-4 GM/100ML-% IV SOLN
INTRAVENOUS | Status: AC
  Filled 2015-11-28: qty 100

## 2015-11-28 MED ORDER — LACTATED RINGERS IV SOLN
INTRAVENOUS | Status: DC | PRN
  Administered 2015-11-28: 14:00:00 via INTRAVENOUS

## 2015-11-28 MED ORDER — FENTANYL CITRATE (PF) 100 MCG/2ML IJ SOLN
INTRAMUSCULAR | Status: AC
  Filled 2015-11-28: qty 2

## 2015-11-28 MED ORDER — FENTANYL CITRATE (PF) 100 MCG/2ML IJ SOLN
25.0000 ug | INTRAMUSCULAR | Status: DC | PRN
  Administered 2015-11-28: 25 ug via INTRAVENOUS

## 2015-11-28 MED ORDER — LIDOCAINE HCL (CARDIAC) 20 MG/ML IV SOLN
INTRAVENOUS | Status: DC | PRN
  Administered 2015-11-28: 100 mg via INTRAVENOUS

## 2015-11-28 SURGICAL SUPPLY — 33 items
ALGISITE ×9 IMPLANT
BRUSH SCRUB 4% CHG (MISCELLANEOUS) IMPLANT
CANISTER SUCT 1200ML W/VALVE (MISCELLANEOUS) ×3 IMPLANT
CHLORAPREP W/TINT 26ML (MISCELLANEOUS) ×3 IMPLANT
DRAPE INCISE IOBAN 66X45 STRL (DRAPES) ×3 IMPLANT
DRSG VAC ATS MED SENSATRAC (GAUZE/BANDAGES/DRESSINGS) IMPLANT
ELECT CAUTERY BLADE 6.4 (BLADE) ×3 IMPLANT
ELECT REM PT RETURN 9FT ADLT (ELECTROSURGICAL) ×3
ELECTRODE REM PT RTRN 9FT ADLT (ELECTROSURGICAL) ×1 IMPLANT
GLOVE BIO SURGEON STRL SZ7 (GLOVE) ×3 IMPLANT
GOWN STRL REUS W/ TWL LRG LVL3 (GOWN DISPOSABLE) ×1 IMPLANT
GOWN STRL REUS W/ TWL XL LVL3 (GOWN DISPOSABLE) ×1 IMPLANT
GOWN STRL REUS W/TWL LRG LVL3 (GOWN DISPOSABLE) ×2
GOWN STRL REUS W/TWL XL LVL3 (GOWN DISPOSABLE) ×2
HANDPIECE SUCTION TUBG SURGILV (MISCELLANEOUS) ×3 IMPLANT
IV NS 1000ML (IV SOLUTION) ×2
IV NS 1000ML BAXH (IV SOLUTION) ×1 IMPLANT
KIT RM TURNOVER STRD PROC AR (KITS) ×3 IMPLANT
LABEL OR SOLS (LABEL) ×3 IMPLANT
NS IRRIG 500ML POUR BTL (IV SOLUTION) ×6 IMPLANT
PACK EXTREMITY ARMC (MISCELLANEOUS) ×3 IMPLANT
PAD PREP 24X41 OB/GYN DISP (PERSONAL CARE ITEMS) ×3 IMPLANT
SOL PREP PVP 2OZ (MISCELLANEOUS) ×6
SOLUTION PREP PVP 2OZ (MISCELLANEOUS) ×2 IMPLANT
SPONGE LAP 18X18 5 PK (GAUZE/BANDAGES/DRESSINGS) ×3 IMPLANT
SUT ETHILON 4-0 (SUTURE)
SUT ETHILON 4-0 FS2 18XMFL BLK (SUTURE)
SUT VIC AB 3-0 SH 27 (SUTURE)
SUT VIC AB 3-0 SH 27X BRD (SUTURE) IMPLANT
SUTURE ETHLN 4-0 FS2 18XMF BLK (SUTURE) IMPLANT
SWAB CULTURE AMIES ANAERIB BLU (MISCELLANEOUS) IMPLANT
SYR BULB EAR ULCER 3OZ GRN STR (SYRINGE) ×3 IMPLANT
WND VAC CANISTER 500ML (MISCELLANEOUS) IMPLANT

## 2015-11-28 NOTE — H&P (Signed)
  [facility] History & Physical Update  The patient was interviewed and re-examined.  The patient's previous History and Physical has been reviewed and is unchanged.  There is no change in the plan of care. We plan to proceed with the scheduled procedure.  DEW,JASON, MD  11/28/2015, 1:00 PM

## 2015-11-28 NOTE — Anesthesia Procedure Notes (Signed)
Procedure Name: LMA Insertion Date/Time: 11/28/2015 2:15 PM Performed by: Almeta MonasFLETCHER, Modesta Sammons Pre-anesthesia Checklist: Emergency Drugs available, Suction available and Patient being monitored Patient Re-evaluated:Patient Re-evaluated prior to inductionOxygen Delivery Method: Circle system utilized Preoxygenation: Pre-oxygenation with 100% oxygen Intubation Type: IV induction Ventilation: Mask ventilation without difficulty LMA Size: 3.5 Dental Injury: Teeth and Oropharynx as per pre-operative assessment

## 2015-11-28 NOTE — Discharge Instructions (Signed)

## 2015-11-28 NOTE — Anesthesia Postprocedure Evaluation (Signed)
Anesthesia Post Note  Patient: [name]  Procedure(s) Performed: Procedure(s) (LRB): DEBRIDEMENT WOUND ( ABDOMINAL ) (N/A)  Patient location during evaluation: PACU Anesthesia Type: General Level of consciousness: awake and alert Pain management: pain level controlled Vital Signs Assessment: post-procedure vital signs reviewed and stable Respiratory status: spontaneous breathing and respiratory function stable Cardiovascular status: stable Anesthetic complications: no    Last Vitals:  Filed Vitals:   11/28/15 1509 11/28/15 1522  BP: 139/58   Pulse: 90 90  Temp:    Resp: 16 15    Last Pain:  Filed Vitals:   11/28/15 1522  PainSc: 4                  Annia Gomm K

## 2015-11-28 NOTE — Transfer of Care (Signed)
Immediate Anesthesia Transfer of Care Note  Patient: Natalie BergamoJoann Y [name]  Procedure(s) Performed: Procedure(s): DEBRIDEMENT WOUND ( ABDOMINAL ) (N/A)  Patient Location: PACU  Anesthesia Type:General  Level of Consciousness: awake and sedated  Airway & Oxygen Therapy: Patient Spontanous Breathing  Post-op Assessment: Report given to RN and Post -op Vital signs reviewed and stable  Post vital signs: Reviewed and stable  Last Vitals:  Filed Vitals:   11/28/15 1316 11/28/15 1454  BP: 99/49 125/56  Pulse: 99 78  Temp: 36.8 C 36 C  Resp: 16 16    Last Pain:  Filed Vitals:   11/28/15 1456  PainSc: Asleep         Complications: No apparent anesthesia complications

## 2015-11-28 NOTE — Anesthesia Preprocedure Evaluation (Signed)
Anesthesia Evaluation  Patient identified by MRN, date of birth, ID band Patient awake    Reviewed: Allergy & Precautions, NPO status , Patient's Chart, lab work & pertinent test results  History of Anesthesia Complications Negative for: history of anesthetic complications  Airway Mallampati: III       Dental  (+) Upper Dentures, Lower Dentures   Pulmonary neg pulmonary ROS,           Cardiovascular hypertension, Pt. on medications + Peripheral Vascular Disease and +CHF  + dysrhythmias Atrial Fibrillation      Neuro/Psych CVA, Residual Symptoms negative neurological ROS     GI/Hepatic negative GI ROS, Neg liver ROS, GERD  Medicated and Controlled,  Endo/Other  diabetes, Poorly Controlled, Oral Hypoglycemic Agents  Renal/GU DialysisRenal diseasenegative Renal ROS     Musculoskeletal   Abdominal   Peds  Hematology  (+) anemia ,   Anesthesia Other Findings   Reproductive/Obstetrics                             Anesthesia Physical Anesthesia Plan  ASA: IV  Anesthesia Plan: General   Post-op Pain Management:    Induction: Intravenous  Airway Management Planned: Mask  Additional Equipment:   Intra-op Plan:   Post-operative Plan:   Informed Consent: I have reviewed the patients History and Physical, chart, labs and discussed the procedure including the risks, benefits and alternatives for the proposed anesthesia with the patient or authorized representative who has indicated his/her understanding and acceptance.     Plan Discussed with:   Anesthesia Plan Comments:         Anesthesia Quick Evaluation

## 2015-11-28 NOTE — Op Note (Signed)
    OPERATIVE NOTE   PROCEDURE: 1. Irrigation and debridement of skin, soft tissue, and muscle right sided abdominal wound for about 45 cm. 2. Irrigation and debridement of skin and soft tissue of left sided abdominal wound for about 50 cm  PRE-OPERATIVE DIAGNOSIS: Nonviable tissue and infection of   2 abdominal wounds with worsening from previous examination and debridement several weeks ago, end-stage renal disease, congestive heart failure  POST-OPERATIVE DIAGNOSIS: Same as above  SURGEON: Festus BarrenJason Yaroslav Gombos, MD  ASSISTANT(S): Raul DelKim Stegmayer, PA-C  ANESTHESIA: general  ESTIMATED BLOOD LOSS: 5 cc  FINDING(S): None  SPECIMEN(S):  none  INDICATIONS:   [name] is a 82 y.o. female who presents with worsening of her 2 abdominal wounds despite previous debridement. The right-sided wound had gotten significantly larger with more necrotic tissue and now had gotten more depth down to near the abdominal musculature. Left-sided wound had slightly enlarged but had developed some tracking superiorly and laterally and still had significant fibrinous exudate and nonviable tissue. She is brought back for further debridement of these wounds to try to control the nonviable tissue and promote wound healing.  DESCRIPTION: After obtaining full informed written consent, the patient was brought back to the operating room and placed supine upon the operating table.  The patient received IV antibiotics prior to induction.  After obtaining adequate anesthesia, the patient was prepped and draped in the standard fashion.  The right sided abdominal wound was then opened and excisional debridement was performed to the skin, soft tissue, and down to the musculature of the abdominal wall to remove all clearly non-viable tissue. Bleeding in the area of the inferior epigastric vessels was encountered and controlled easily with electrocautery. The tissue was taken back to bleeding tissue that appeared viable.  The  debridement was performed with Metzenbaum scissors and electrocautery and encompassed an area of approximately 45 cm2.  Several weeks ago, it was only about half this size. The wound was irrigated copiously with pulse lavage saline.  After all clearly non-viable tissue was removed, alginate dressing was placed on the wound and covered with gauze, ABDs, and tape. Insurance would not approve the negative pressure dressing for this abdominal wound. I then turned my attention to the left sided abdominal wound. This was larger than it was previously significant amount of fibrinous exudate and nonviable tissue remained although this was slightly better than her last visit. Debridement was performed to skin and soft tissue with Metzenbaum scissors and electrocautery and encompassed an area of approximately 50 cm. There was some tracking laterally and superiorly that we opened and evacuated. The wound was irrigated copiously with pulse lavage saline. Again, insurance would not approve negative pressure dressing for this abdominal wound either. As such, I elected to place an alginate dressing in this left-sided abdominal wound as well. This was then covered with gauze, ABDs, and tape. The patient was then awakened from anesthesia and taken to the recovery room in stable condition having tolerated the procedure well.  COMPLICATIONS: none  CONDITION: stable  [name]  11/28/2015, 3:04 PM

## 2015-11-29 ENCOUNTER — Encounter: Payer: Self-pay | Admitting: Vascular Surgery

## 2015-12-17 ENCOUNTER — Other Ambulatory Visit
Admission: RE | Admit: 2015-12-17 | Discharge: 2015-12-17 | Disposition: A | Discharge status: Home / Self Care | Payer: Medicare Other | Source: Ambulatory Visit | Attending: Internal Medicine | Admitting: Internal Medicine

## 2015-12-17 ENCOUNTER — Encounter: Payer: Medicare Other | Attending: Internal Medicine | Admitting: Internal Medicine

## 2015-12-17 DIAGNOSIS — S31104A Unspecified open wound of abdominal wall, left lower quadrant without penetration into peritoneal cavity, initial encounter: Secondary | ICD-10-CM | POA: Insufficient documentation

## 2015-12-17 DIAGNOSIS — N186 End stage renal disease: Secondary | ICD-10-CM | POA: Diagnosis not present

## 2015-12-17 DIAGNOSIS — E1122 Type 2 diabetes mellitus with diabetic chronic kidney disease: Secondary | ICD-10-CM | POA: Diagnosis not present

## 2015-12-17 DIAGNOSIS — Z794 Long term (current) use of insulin: Secondary | ICD-10-CM | POA: Insufficient documentation

## 2015-12-17 DIAGNOSIS — Z992 Dependence on renal dialysis: Secondary | ICD-10-CM | POA: Diagnosis not present

## 2015-12-17 DIAGNOSIS — I5033 Acute on chronic diastolic (congestive) heart failure: Secondary | ICD-10-CM | POA: Diagnosis not present

## 2015-12-17 DIAGNOSIS — L89610 Pressure ulcer of right heel, unstageable: Secondary | ICD-10-CM | POA: Insufficient documentation

## 2015-12-17 DIAGNOSIS — J9620 Acute and chronic respiratory failure, unspecified whether with hypoxia or hypercapnia: Secondary | ICD-10-CM | POA: Diagnosis not present

## 2015-12-17 DIAGNOSIS — X58XXXA Exposure to other specified factors, initial encounter: Secondary | ICD-10-CM | POA: Diagnosis not present

## 2015-12-17 DIAGNOSIS — I70233 Atherosclerosis of native arteries of right leg with ulceration of ankle: Secondary | ICD-10-CM | POA: Insufficient documentation

## 2015-12-17 DIAGNOSIS — E11621 Type 2 diabetes mellitus with foot ulcer: Secondary | ICD-10-CM | POA: Diagnosis present

## 2015-12-17 DIAGNOSIS — I132 Hypertensive heart and chronic kidney disease with heart failure and with stage 5 chronic kidney disease, or end stage renal disease: Secondary | ICD-10-CM | POA: Insufficient documentation

## 2015-12-17 DIAGNOSIS — T148 Other injury of unspecified body region: Secondary | ICD-10-CM | POA: Diagnosis present

## 2015-12-17 DIAGNOSIS — E1152 Type 2 diabetes mellitus with diabetic peripheral angiopathy with gangrene: Secondary | ICD-10-CM | POA: Diagnosis not present

## 2015-12-17 DIAGNOSIS — L97319 Non-pressure chronic ulcer of right ankle with unspecified severity: Secondary | ICD-10-CM | POA: Diagnosis not present

## 2015-12-17 DIAGNOSIS — I4891 Unspecified atrial fibrillation: Secondary | ICD-10-CM | POA: Insufficient documentation

## 2015-12-17 DIAGNOSIS — Z8673 Personal history of transient ischemic attack (TIA), and cerebral infarction without residual deficits: Secondary | ICD-10-CM | POA: Diagnosis not present

## 2015-12-17 NOTE — Progress Notes (Signed)
[name], Natalie Y. ([id number]) Visit Report for 12/17/2015 Abuse/Suicide Risk Screen Details Patient Name: [name], Natalie Y. Date of Service: 12/17/2015 8:00 AM Medical Record Patient Account Number: [id number] Number: Treating RN: Curtis SitesDorthy, Joanna 1933/11/16 (82 [name] Other Clinician: Date of Birth/Sex: Female) Treating ROBSON, MICHAEL Primary Care Physician/Extender: Rex KrasG Grandis, Heidi Physician: Geri SeminoleSTEGMAYER, Referring Physician: Marlou SaKIMBERLY Weeks in Treatment: 0 Abuse/Suicide Risk Screen Items Answer ABUSE/SUICIDE RISK SCREEN: Has anyone close to you tried to hurt or harm you recentlyo No Do you feel uncomfortable with anyone in your familyo No Has anyone forced you do things that you didnot want to doo No Do you have any thoughts of harming yourselfo No Patient displays signs or symptoms of abuse and/or neglect. No Electronic Signature(s) Signed: 12/17/2015 12:39:46 PM By: Curtis Sitesorthy, Joanna Entered By: Curtis Sitesorthy, Joanna on 12/17/2015 08:30:43 [name], Natalie Y. ([id number]) -------------------------------------------------------------------------------- Activities of Daily Living Details Patient Name: [name], Natalie Y. Date of Service: 12/17/2015 8:00 AM Medical Record Patient Account Number: [id number] Number: Treating RN: Curtis SitesDorthy, Joanna 1933/11/16 (82 [name] Other Clinician: Date of Birth/Sex: Female) Treating ROBSON, MICHAEL Primary Care Physician/Extender: Rex KrasG Grandis, Heidi Physician: Geri SeminoleSTEGMAYER, Referring Physician: Marlou SaKIMBERLY Weeks in Treatment: 0 Activities of Daily Living Items Answer Activities of Daily Living (Please select one for each item) Drive Automobile Not Able Take Medications Need Assistance Use Telephone Completely Able Care for Appearance Completely Able Use Toilet Need Assistance Bath / Shower Need Assistance Dress Self Need Assistance Feed Self Completely Able Walk Need Assistance Get In / Out Bed Completely Able Housework Need  Assistance Prepare Meals Need Assistance Handle Money Completely Able Shop for Self Need Assistance Electronic Signature(s) Signed: 12/17/2015 12:39:46 PM By: Curtis Sitesorthy, Joanna Entered By: Curtis Sitesorthy, Joanna on 12/17/2015 08:31:24 [name], Natalie Y. ([id number]) -------------------------------------------------------------------------------- Education Assessment Details Patient Name: [name], Natalie Y. Date of Service: 12/17/2015 8:00 AM Medical Record Patient Account Number: [id number] Number: Treating RN: Curtis SitesDorthy, Joanna 1933/11/16 (82 [name] Other Clinician: Date of Birth/Sex: Female) Treating ROBSON, MICHAEL Primary Care Physician/Extender: Rex KrasG Grandis, Heidi Physician: Geri SeminoleSTEGMAYER, Referring Physician: Marlou SaKIMBERLY Weeks in Treatment: 0 Primary Learner Assessed: Caregiver Reason Patient is not Primary Learner: wound location Learning Preferences/Education Level/Primary Language Learning Preference: Explanation, Demonstration Highest Education Level: High School Preferred Language: English Cognitive Barrier Assessment/Beliefs Language Barrier: No Translator Needed: No Memory Deficit: No Emotional Barrier: No Cultural/Religious Beliefs Affecting Medical No Care: Physical Barrier Assessment Impaired Vision: No Impaired Hearing: No Decreased Hand dexterity: No Knowledge/Comprehension Assessment Knowledge Level: Medium Comprehension Level: Medium Ability to understand written Medium instructions: Ability to understand verbal Medium instructions: Motivation Assessment Anxiety Level: Calm Cooperation: Cooperative Education Importance: Acknowledges Need Interest in Health Problems: Asks Questions Perception: Coherent Medium [name], Natalie Y. ([id number]) Willingness to Engage in Self- Management Activities: Readiness to Engage in Self- Medium Management Activities: Electronic Signature(s) Signed: 12/17/2015 12:39:46 PM By: Curtis Sitesorthy, Joanna Entered By: Curtis Sitesorthy, Joanna on  12/17/2015 08:31:55 [name], Laqueshia Y. ([id number]) -------------------------------------------------------------------------------- Fall Risk Assessment Details Patient Name: [name], Lessie Y. Date of Service: 12/17/2015 8:00 AM Medical Record Patient Account Number: [id number] Number: Treating RN: Curtis SitesDorthy, Joanna 1933/11/16 (82 [name] Other Clinician: Date of Birth/Sex: Female) Treating ROBSON, MICHAEL Primary Care Physician/Extender: Rex KrasG Grandis, Heidi Physician: Geri SeminoleSTEGMAYER, Referring Physician: Marlou SaKIMBERLY Weeks in Treatment: 0 Fall Risk Assessment Items Have you had 2 or more falls in the last 12 monthso 0 Yes Have you had any fall that resulted in injury in the last 12 monthso 0 Yes FALL RISK ASSESSMENT: History of falling - immediate or within 3 months 25 Yes Secondary diagnosis 0  No Ambulatory aid None/bed rest/wheelchair/nurse 0 No Crutches/cane/walker 15 Yes Furniture 0 No IV Access/Saline Lock 0 No Gait/Training Normal/bed rest/immobile 0 No Weak 10 Yes Impaired 0 No Mental Status Oriented to own ability 0 Yes Electronic Signature(s) Signed: 12/17/2015 12:39:46 PM By: Curtis Sites Entered By: Curtis Sites on 12/17/2015 08:32:16 Natalie Rima ([id number]) -------------------------------------------------------------------------------- Foot Assessment Details Patient Name: Natalie Rima. Date of Service: 12/17/2015 8:00 AM Medical Record Patient Account Number: [id number] Number: Treating RN: Cozette, Braggs Aug 12, 1933 (82 [name] Other Clinician: Date of Birth/Sex: Female) Treating ROBSON, MICHAEL Primary Care Physician/Extender: Rex Kras, Heidi Physician: Geri Seminole, Referring Physician: Marlou Sa in Treatment: 0 Foot Assessment Items Site Locations + = Sensation present, - = Sensation absent, C = Callus, U = Ulcer R = Redness, W = Warmth, M = Maceration, PU = Pre-ulcerative lesion F = Fissure, S = Swelling, D =  Dryness Assessment Right: Left: Other Deformity: No No Prior Foot Ulcer: No No Prior Amputation: No No Charcot Joint: No No Ambulatory Status: Non-ambulatory Assistance Device: Wheelchair GaitFutures trader) Signed: 12/17/2015 12:39:46 PM By: Neldon Labella ([id number]) Entered By: Curtis Sites on 12/17/2015 08:33:38 Natalie Rima ([id number]) -------------------------------------------------------------------------------- Nutrition Risk Assessment Details Patient Name: Natalie Rima Date of Service: 12/17/2015 8:00 AM Medical Record Patient Account Number: [id number] Number: Treating RN: Samamtha, Tiegs [birth year] (82 [name] Other Clinician: Date of Birth/Sex: Female) Treating ROBSON, MICHAEL Primary Care Physician/Extender: Rex Kras, Heidi Physician: Geri Seminole, Referring Physician: Marlou Sa in Treatment: 0 Height (in): 65 Weight (lbs): 160 Body Mass Index (BMI): 26.6 Nutrition Risk Assessment Items NUTRITION RISK SCREEN: I have an illness or condition that made me change the kind and/or 0 No amount of food I eat I eat fewer than two meals per day 0 No I eat few fruits and vegetables, or milk products 0 No I have three or more drinks of beer, liquor or wine almost every day 0 No I have tooth or mouth problems that make it hard for me to eat 0 No I don't always have enough money to buy the food I need 0 No I eat alone most of the time 0 No I take three or more different prescribed or over-the-counter drugs a 1 Yes day Without wanting to, I have lost or gained 10 pounds in the last six 0 No months I am not always physically able to shop, cook and/or feed myself 0 No Nutrition Protocols Good Risk Protocol 0 No interventions needed Moderate Risk Protocol Electronic Signature(s) Signed: 12/17/2015 12:39:46 PM By: Curtis Sites Entered By: Curtis Sites on 12/17/2015 08:32:35

## 2015-12-19 NOTE — Progress Notes (Signed)
HAJA, CREGO ([id number]) Visit Report for 12/17/2015 Chief Complaint Document Details Patient Name: [name], [name]. Date of Service: 12/17/2015 8:00 AM Medical Record Patient Account Number: [id number] Number: Treating RN: Kamil, Hanigan [birth year] (82 y.o. Other Clinician: Date of Birth/Sex: Female) Treating Turhan Chill Primary Care Physician/Extender: Rex Kras, Heidi Physician: Geri Seminole, Referring Physician: Marlou Sa in Treatment: 0 Information Obtained from: Patient Chief Complaint Patients presents for treatment of an open diabetic ulcer, arterial ulcer and pressure ulcers to the right lower extremities which is a complex etiology for the last several months. He also has ulcers on her abdomen in the left lower quadrant and left suprapubic area which she's had for about 4 months. 12/17/15; patient returns to clinic apparently referred back from vascular surgery for ongoing wound care. Electronic Signature(s) Signed: 12/18/2015 3:12:28 PM By: Baltazar Najjar MD Entered By: Baltazar Najjar on 12/17/2015 09:11:12 [name] ([id number]) -------------------------------------------------------------------------------- Debridement Details Patient Name: [name] Date of Service: 12/17/2015 8:00 AM Medical Record Patient Account Number: [id number] Number: Treating RN: Khylie, Larmore 1933/10/10 (82 y.o. Other Clinician: Date of Birth/Sex: Female) Treating Weylin Plagge Primary Care Physician/Extender: Rex Kras, Heidi Physician: Geri Seminole, Referring Physician: Marlou Sa in Treatment: 0 Debridement Performed for Wound #12 Left Abdomen - Lower Quadrant Assessment: Performed By: Physician Maxwell Caul, MD Debridement: Debridement Pre-procedure Yes Verification/Time Out Taken: Start Time: 09:31 Pain Control: Lidocaine 4% Topical Solution Level: Skin/Subcutaneous Tissue Total Area Debrided (L x 2.1 (cm) x 10.7  (cm) = 22.47 (cm) W): Tissue and other Viable, Blood Clots, Eschar, Fibrin/Slough material debrided: Instrument: Blade, Forceps Bleeding: Minimum Hemostasis Achieved: Pressure End Time: 09:34 Procedural Pain: 0 Post Procedural Pain: 0 Response to Treatment: Procedure was tolerated well Post Debridement Measurements of Total Wound Length: (cm) 2.1 Width: (cm) 10.7 Depth: (cm) 2.2 Volume: (cm) 38.825 Post Procedure Diagnosis Same as Pre-procedure Electronic Signature(s) Signed: 12/17/2015 12:39:46 PM By: Curtis Sites Signed: 12/18/2015 3:12:28 PM By: Baltazar Najjar MD Entered By: Curtis Sites on 12/17/2015 09:35:15 QUYEN, CUTSFORTH ([id number]) CHASTELYN, ATHENS ([id number]) -------------------------------------------------------------------------------- Debridement Details Patient Name: [name]. Date of Service: 12/17/2015 8:00 AM Medical Record Patient Account Number: [id number] Number: Treating RN: Tryniti, Laatsch 1933/11/22 (82 y.o. Other Clinician: Date of Birth/Sex: Female) Treating Catlin Doria Primary Care Physician/Extender: Rex Kras, Heidi Physician: Geri Seminole, Referring Physician: Marlou Sa in Treatment: 0 Debridement Performed for Wound #11 Midline Abdomen - Lower Quadrant Assessment: Performed By: Physician Maxwell Caul, MD Debridement: Debridement Pre-procedure Yes Verification/Time Out Taken: Start Time: 09:34 Pain Control: Lidocaine 4% Topical Solution Level: Skin/Subcutaneous Tissue Total Area Debrided (L x 5.6 (cm) x 9 (cm) = 50.4 (cm) W): Tissue and other Viable, Eschar, Fibrin/Slough material debrided: Instrument: Blade, Forceps Bleeding: Minimum Hemostasis Achieved: Pressure End Time: 09:36 Procedural Pain: 0 Post Procedural Pain: 0 Response to Treatment: Procedure was tolerated well Post Debridement Measurements of Total Wound Length: (cm) 5.6 Width: (cm) 9 Depth: (cm) 2 Volume: (cm)  79.168 Post Procedure Diagnosis Same as Pre-procedure Electronic Signature(s) Signed: 12/17/2015 12:39:46 PM By: Curtis Sites Signed: 12/18/2015 3:12:28 PM By: Baltazar Najjar MD Entered By: Curtis Sites on 12/17/2015 09:36:00 JANICA, ELDRED ([id number]) DELAILAH, SPIETH ([id number]) -------------------------------------------------------------------------------- Debridement Details Patient Name: [name]. Date of Service: 12/17/2015 8:00 AM Medical Record Patient Account Number: [id number] Number: Treating RN: Sarahjane, Matherly [birth year] (82 y.o. Other Clinician: Date of Birth/Sex: Female) Treating Aubrionna Istre Primary Care Physician/Extender: Rex Kras, Heidi Physician: Geri Seminole, Referring Physician: Marlou Sa in Treatment: 0 Debridement  Performed for Wound #8 Right,Lateral Lower Leg Assessment: Performed By: Physician Maxwell Caul, MD Debridement: Debridement Pre-procedure Yes Verification/Time Out Taken: Start Time: 09:36 Pain Control: Lidocaine 4% Topical Solution Level: Skin/Subcutaneous Tissue Total Area Debrided (L x 4 (cm) x 3.3 (cm) = 13.2 (cm) W): Tissue and other Viable, Blood Clots, Eschar, Fibrin/Slough material debrided: Instrument: Curette Bleeding: Minimum Hemostasis Achieved: Pressure End Time: 09:38 Procedural Pain: 0 Post Procedural Pain: 0 Response to Treatment: Procedure was tolerated well Post Debridement Measurements of Total Wound Length: (cm) 4 Width: (cm) 3.3 Depth: (cm) 0.3 Volume: (cm) 3.11 Post Procedure Diagnosis Same as Pre-procedure Electronic Signature(s) Signed: 12/17/2015 12:39:46 PM By: Curtis Sites Signed: 12/18/2015 3:12:28 PM By: Baltazar Najjar MD Entered By: Curtis Sites on 12/17/2015 09:38:12 TRENIECE, HOLSCLAW ([id number]) SHANIK, BROOKSHIRE ([id number]) -------------------------------------------------------------------------------- Debridement Details Patient Name: [name]. Date of Service: 12/17/2015 8:00 AM Medical Record Patient Account Number: [id number] Number: Treating RN: Ryli, Standlee [birth year] (82 y.o. Other Clinician: Date of Birth/Sex: Female) Treating Kaden Daughdrill Primary Care Physician/Extender: Rex Kras, Heidi Physician: Geri Seminole, Referring Physician: Marlou Sa in Treatment: 0 Debridement Performed for Wound #9 Right Achilles Assessment: Performed By: Physician Maxwell Caul, MD Debridement: Debridement Pre-procedure Yes Verification/Time Out Taken: Start Time: 09:38 Pain Control: Lidocaine 4% Topical Solution Level: Skin/Subcutaneous Tissue Total Area Debrided (L x 8.3 (cm) x 5 (cm) = 41.5 (cm) W): Tissue and other Viable, Blood Clots, Eschar, Fibrin/Slough material debrided: Instrument: Curette Bleeding: Minimum Hemostasis Achieved: Pressure End Time: 09:41 Procedural Pain: 0 Post Procedural Pain: 0 Response to Treatment: Procedure was tolerated well Post Debridement Measurements of Total Wound Length: (cm) 8.3 Width: (cm) 5 Depth: (cm) 0.3 Volume: (cm) 9.778 Post Procedure Diagnosis Same as Pre-procedure Electronic Signature(s) Signed: 12/17/2015 12:39:46 PM By: Curtis Sites Signed: 12/18/2015 3:12:28 PM By: Baltazar Najjar MD Entered By: Curtis Sites on 12/17/2015 09:39:09 ALLISSON, SCHINDEL ([id number]) BREONA, CHERUBIN ([id number]) -------------------------------------------------------------------------------- Debridement Details Patient Name: [name]. Date of Service: 12/17/2015 8:00 AM Medical Record Patient Account Number: [id number] Number: Treating RN: [name], [name] 30-Apr-1934 (82 y.o. Other Clinician: Date of Birth/Sex: Female) Treating Latashia Koch Primary Care Physician/Extender: Rex Kras, Heidi Physician: Geri Seminole, Referring Physician: Marlou Sa in Treatment: 0 Debridement Performed for Wound #10 Right  Calcaneus Assessment: Performed By: Physician Maxwell Caul, MD Debridement: Debridement Pre-procedure Yes Verification/Time Out Taken: Start Time: 09:41 Pain Control: Lidocaine 4% Topical Solution Level: Skin/Subcutaneous Tissue Total Area Debrided (L x 1.3 (cm) x 1.8 (cm) = 2.34 (cm) W): Tissue and other Viable, Blood Clots, Eschar, Fibrin/Slough material debrided: Instrument: Blade, Forceps Bleeding: Minimum Hemostasis Achieved: Pressure End Time: 09:44 Procedural Pain: 0 Post Procedural Pain: 0 Response to Treatment: Procedure was tolerated well Post Debridement Measurements of Total Wound Length: (cm) 1.3 Width: (cm) 1.8 Depth: (cm) 0.3 Volume: (cm) 0.551 Post Procedure Diagnosis Same as Pre-procedure Electronic Signature(s) Signed: 12/17/2015 12:39:46 PM By: Curtis Sites Signed: 12/18/2015 3:12:28 PM By: Baltazar Najjar MD Entered By: Curtis Sites on 12/17/2015 09:39:50 GISSEL, KEILMAN ([id number]) AVALIN, BRILEY ([id number]) -------------------------------------------------------------------------------- HPI Details Patient Name: MAKELA, NIEHOFF. Date of Service: 12/17/2015 8:00 AM Medical Record Patient Account Number: [id number] Number: Treating RN: Sheranda, Seabrooks [birth year] (82 y.o. Other Clinician: Date of Birth/Sex: Female) Treating Analys Ryden Primary Care Physician/Extender: Rex Kras, Heidi Physician: Geri Seminole, Referring Physician: Marlou Sa in Treatment: 0 History of Present Illness Location: several wounds on the right lower extremity including her right healed right posterior ankle and right lower third of  the leg. She also has wounds on her left lower quadrant of the abdomen and the suprapubic area. Quality: Patient reports experiencing a sharp pain to affected area(s). Severity: Patient states wound are getting worse. Duration: Patient has had the wound for > 3 months prior to seeking treatment at the wound  center Timing: Pain in wound is Intermittent (comes and goes Context: The wound appeared gradually over time Modifying Factors: Other treatment(s) tried include:he simply been admitted to the hospital 2 weeks ago and has had procedures done on her right lower extremity and had a blockage which we are trying to get some notes Associated Signs and Symptoms: Patient reports having increase discharge. HPI Description: 82 year old patient who is known to be diabetic, was referred to [place] by Dr. Gavin Potters for a right heel ulceration which she's had for a while. She was recently in hospital for a pneumonia and at that time and got delirious and was disoriented and sometime during this time developed a stage II ulcer on her right heel. Her past medical history is significant for bilateral pneumonia which was treated with injectable antibiotics and then to oral Levaquin which he has completed. She also has acute on chronic diastolic CHF, acute on chronic respiratory failure, end-stage renal disease on hemodialysis, atrial fibrillation, recent stroke, diabetes mellitus. The patient and her son are poor historians but from what I understand she was admitted to the hospital with an acute vascular compromise of her right lower extremity and Dr. Wyn Quaker has done a surgical procedure and we are trying to obtain these notes. There are also some vascular workup done and we will try and obtain these notes. the injury to the left lower quadrant of abdomen and the suprapubic area have been there due to a bruise and have been there for several months and no intervention has been done. 10/11/2015 -- on review of the electronics records it was noted that the patient was admitted to the hospital on 09/14/2015 with peripheral vascular disease with claudication, end-stage renal disease, pressure ulcer, chronic atrial fibrillation. She was seen by Dr. Wyn Quaker who did her right lower extremity angiogram , angioplasty of the right  anterior tibial artery and thrombolysis with TPA of the right popliteal artery, and thrombectomy. She was seen by Dr. Wyn Quaker during this past week and he was pleased with the progress. He did say that if he took her to the operating room for any procedure he would debride the abdominal wound under anesthesia. She was also seen by Dr. Ether Griffins the podiatrist who thought that she may lose her right fourth toe at some stage may need an amputation of this. EIMAN, MARET ([id number]) 10/21/2015 --patient known to Dr. Wyn Quaker and his last office visit from 10/04/2015 has been reviewed. 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 She also had a persistent ulceration of the right heel and markedly this area and a right third and fourth toe and a small scab on the calf but these are dry and seemed to be improving. Patient also has a left carotid endarterectomy and multiple interventions to a right brachiocephalic AV fistula. After the visit he had recommended noninvasive studies to recheck her revascularization. He was off the impression that she would likely lose the right fourth toe and the third toe was likely to heal. He was concerned about underlying muscle necrosis on her right heel and midfoot. 11/01/2015 -- an  echo done in January of this year showed her left ventricular ejection fraction to be about 50-55%. The patient was seen by the PA and Dr. Driscilla Grammes office and the plan was to take her to the operating room soon to have a debridement under anesthesia for the abdominal wall wound, the Achilles tendon on the right leg and amputation of the right fourth toe. The daughter and the patient do not feel that they would be able to undergo hyperbaric oxygen therapy 5 days a week for 6 weeks. 12/17/15; this is a medically complex woman who I note was recently in this clinic however I was not  involved with her care. She returns today with multiple wounds; a) she has a wound in the mid abdomen that is been there since March of this year. I note that she is been to the overall for debridement recently. The exact etiology of this wound is not really clear b) left lower quadrant abdominal wound had some sanguinous drainage when she came in here. The patient fell in January and thinks this may have been secondary to a hematoma. c): The patient has 3 wounds on her right leg including a small wound on the right mid calf, a large area over the Achilles which currently has a wound VAC for the last 6 weeks, also a smaller wound on the distal part of the right heel. As far as I understand most of these wounds are currently been dressed with's calcium alginate. According to her daughter the Achilles wound under the wound VAC is doing well d) the patient is had an amputation of her left fifth toe in January and the right fourth toe 6 weeks ago secondary to diabetic PAD e) the patient has chronic renal failure on dialysis for the last 2 years secondary to type 2 diabetes on insulin. The daughter's knowledge there is been no biopsy of the abdominal wounds given their current appearance and lack of undefined etiology at have to wonder about calciphylaxis. 12/18/15:Addendum; I [facility] I can see no relevant x-rays of the right heel. I note her arteriogram and revascularization of her right lower extremity in April 2017. She had debridement of both abdominal wounds and the right heel and Achilles wound on 11/07/15 at which time she had a right fourth toe ray amputation. The abdominal wounds were debridement again on 6/29. I do not see any relevant pathology of these abdominal wounds Electronic Signature(s) Signed: 12/18/2015 3:12:28 PM By: Baltazar Najjar MD Entered By: Baltazar Najjar on 12/18/2015 07:47:01 Talita, Recht Vinetta Bergamo  ([id number]) -------------------------------------------------------------------------------- Physical Exam Details Patient Name: [name] Date of Service: 12/17/2015 8:00 AM Medical Record Patient Account Number: [id number] Number: Treating RN: Shamarra, Warda [birth year] (82 y.o. Other Clinician: Date of Birth/Sex: Female) Treating Raney Antwine Primary Care Physician/Extender: Rex Kras, Heidi Physician: Geri Seminole, Referring Physician: Marlou Sa in Treatment: 0 Constitutional Sitting or standing Blood Pressure is within target range for patient.. Pulse regular and within target range for patient.Marland Kitchen Respirations regular, non-labored and within target range.. Temperature is normal and within the target range for the patient.. Patient was not in any distress. Malodorous wounds however. Neck Neck supple and symmetrical. No masses or crepitus. Respiratory Respiratory effort is easy and symmetric bilaterally. Rate is normal at rest and on room air.. Bilateral breath sounds are clear and equal in all lobes with no wheezes, rales or rhonchi.. Cardiovascular Heart rhythm and rate regular, without murmur or gallop. Patient appeared to be euvolemic. I could  feel a femoral pulse on the right not the left. Pedal pulses absent bilaterally.. Gastrointestinal (GI) Other than the wounds in the mid abdomen and left lower quadrant no masses were noted. No liver or spleen enlargement or tenderness.. Lymphatic Nonpalpable in the cervical clavicular or axillary areas. Neurological . Psychiatric No evidence of depression, anxiety, or agitation. Calm, cooperative, and communicative. Appropriate interactions and affect.. Notes Wound exam; oFirstly on the abdomen in the lower midline and also in the left lower quadrant are large necrotic open areas. There was some sanguinous drainage coming out of the left lower quadrant. Midline wound was purulent culture obtained  debridement of necrotic material clear. oRight lateral calf was debridement of surface eschar this cleans up quite nicely. Debridement of the Achilles area was also done, there may be involved tendon at the lower aspect of this. Finally on the distal Achilles heel a small wound with necrotic material also debridement. Electronic Signature(s) Signed: 12/18/2015 3:12:28 PM By: Baltazar Najjar MD [name] ([id number]) Entered By: Baltazar Najjar on 12/17/2015 10:11:27 [name] ([id number]) -------------------------------------------------------------------------------- Physician Orders Details Patient Name: NECHAMA, ESCUTIA. Date of Service: 12/17/2015 8:00 AM Medical Record Patient Account Number: [id number] Number: Treating RN: Alanis, Clift 1933-06-19 (82 y.o. Other Clinician: Date of Birth/Sex: Female) Treating Raley Novicki Primary Care Physician/Extender: Rex Kras, Heidi Physician: Geri Seminole, Referring Physician: Marlou Sa in Treatment: 0 Verbal / Phone Orders: Yes Clinician: Curtis Sites Read Back and Verified: Yes Diagnosis Coding ICD-10 Coding Code Description E11.621 Type 2 diabetes mellitus with foot ulcer E11.52 Type 2 diabetes mellitus with diabetic peripheral angiopathy with gangrene Unspecified open wound of abdominal wall, left lower quadrant without penetration into S31.104A peritoneal cavity, initial encounter Wound Cleansing Wound #10 Right Calcaneus o Clean wound with Normal Saline. Wound #11 Midline Abdomen - Lower Quadrant o Clean wound with Normal Saline. Wound #12 Left Abdomen - Lower Quadrant o Clean wound with Normal Saline. Wound #8 Right,Lateral Lower Leg o Clean wound with Normal Saline. Wound #9 Right Achilles o Clean wound with Normal Saline. Anesthetic Wound #10 Right Calcaneus o Topical Lidocaine 4% cream applied to wound bed prior to debridement - in Wound Clinic Wound #11 Midline Abdomen -  Lower Quadrant o Topical Lidocaine 4% cream applied to wound bed prior to debridement - in Wound Clinic Wound #12 Left Abdomen - Lower Quadrant o Topical Lidocaine 4% cream applied to wound bed prior to debridement - in Wound Clinic MIKIAH, DURALL ([id number]) Wound #8 Right,Lateral Lower Leg o Topical Lidocaine 4% cream applied to wound bed prior to debridement - in Wound Clinic Wound #9 Right Achilles o Topical Lidocaine 4% cream applied to wound bed prior to debridement - in Wound Clinic Skin Barriers/Peri-Wound Care Wound #10 Right Calcaneus o Skin Prep Wound #11 Midline Abdomen - Lower Quadrant o Skin Prep Wound #12 Left Abdomen - Lower Quadrant o Skin Prep Wound #8 Right,Lateral Lower Leg o Skin Prep Wound #9 Right Achilles o Skin Prep Primary Wound Dressing Wound #10 Right Calcaneus o Santyl Ointment Wound #11 Midline Abdomen - Lower Quadrant o Santyl Ointment Wound #12 Left Abdomen - Lower Quadrant o Santyl Ointment Wound #8 Right,Lateral Lower Leg o Santyl Ointment Wound #9 Right Achilles o Saline moistened gauze - in wound clinic only Secondary Dressing Wound #10 Right Calcaneus o ABD pad o Dry Gauze - secure with tape Wound #11 Midline Abdomen - Lower Quadrant o ABD pad o Dry Gauze - secure with tape LILLIEANN, PAVLICH ([id number]) Wound #12  Left Abdomen - Lower Quadrant o ABD pad o Dry Gauze - secure with tape Wound #8 Right,Lateral Lower Leg o ABD pad o Dry Gauze - secure with tape Wound #9 Right Achilles o ABD pad - in wound clinic only o Dry Gauze - in wound clinic only Dressing Change Frequency Wound #10 Right Calcaneus o Change Dressing Monday, Wednesday, Friday Wound #11 Midline Abdomen - Lower Quadrant o Change Dressing Monday, Wednesday, Friday Wound #12 Left Abdomen - Lower Quadrant o Change Dressing Monday, Wednesday, Friday Wound #8 Right,Lateral Lower Leg o Change Dressing Monday,  Wednesday, Friday Wound #9 Right Achilles o Change Dressing Monday, Wednesday, Friday Follow-up Appointments Wound #10 Right Calcaneus o Return Appointment in 1 week. Wound #11 Midline Abdomen - Lower Quadrant o Return Appointment in 1 week. Wound #12 Left Abdomen - Lower Quadrant o Return Appointment in 1 week. Wound #8 Right,Lateral Lower Leg o Return Appointment in 1 week. Wound #9 Right Achilles o Return Appointment in 1 week. Home Health Wound #10 Right Calcaneus o Continue Home Health Visits - [facility] to order appropriate wound care supplies for patient LEEANDRA, ELLERSON ([id number]) o Home Health Nurse may visit [facility] to address patientos wound care needs. o FACE TO FACE ENCOUNTER: MEDICARE and MEDICAID PATIENTS: I certify that this patient is under my care and that I had a face-to-face encounter that meets the physician face-to-face encounter requirements with this patient on this date. The encounter with the patient was in whole or in part for the following MEDICAL CONDITION: (primary reason for Home Healthcare) MEDICAL NECESSITY: I certify, that based on my findings, NURSING services are a medically necessary home health service. HOME BOUND STATUS: I certify that my clinical findings support that this patient is homebound (i.e., Due to illness or injury, pt requires aid of supportive devices such as crutches, cane, wheelchairs, walkers, the use of special transportation or the assistance of another person to leave their place of residence. There is a normal inability to leave the home and doing so requires considerable and taxing effort. Other absences are for medical reasons / religious services and are infrequent or of short duration when for other reasons). o If current dressing causes regression in wound condition, may D/C ordered dressing product/s and apply Normal Saline Moist Dressing daily until next Wound Healing Center / Other MD appointment. Notify  Wound Healing Center of regression in wound condition at [phone]. o Please direct any NON-WOUND related issues/requests for orders to patient's Primary Care Physician Wound #11 Midline Abdomen - Lower Quadrant o Continue Home Health Visits - [facility] to order appropriate wound care supplies for patient o Home Health Nurse may visit [facility] to address patientos wound care needs. o FACE TO FACE ENCOUNTER: MEDICARE and MEDICAID PATIENTS: I certify that this patient is under my care and that I had a face-to-face encounter that meets the physician face-to-face encounter requirements with this patient on this date. The encounter with the patient was in whole or in part for the following MEDICAL CONDITION: (primary reason for Home Healthcare) MEDICAL NECESSITY: I certify, that based on my findings, NURSING services are a medically necessary home health service. HOME BOUND STATUS: I certify that my clinical findings support that this patient is homebound (i.e., Due to illness or injury, pt requires aid of supportive devices such as crutches, cane, wheelchairs, walkers, the use of special transportation or the assistance of another person to leave their place of residence. There is a normal inability to leave the home and  doing so requires considerable and taxing effort. Other absences are for medical reasons / religious services and are infrequent or of short duration when for other reasons). o If current dressing causes regression in wound condition, may D/C ordered dressing product/s and apply Normal Saline Moist Dressing daily until next Wound Healing Center / Other MD appointment. Notify Wound Healing Center of regression in wound condition at [phone]. o Please direct any NON-WOUND related issues/requests for orders to patient's Primary Care Physician Wound #12 Left Abdomen - Lower Quadrant o Continue Home Health Visits - [facility] to order appropriate wound care supplies for  patient o Home Health Nurse may visit PRN to address patientos wound care needs. o FACE TO FACE ENCOUNTER: MEDICARE and MEDICAID PATIENTS: I certify that this patient is under my care and that I had a face-to-face encounter that meets the physician face-to-face encounter requirements with this patient on this date. The encounter with the patient was in whole or in part for the following MEDICAL CONDITION: (primary reason for Home Healthcare) MEDICAL NECESSITY: I certify, that based on my findings, NURSING services are a medically necessary home health service. HOME BOUND STATUS: I certify that my clinical findings support that this patient is homebound (i.e., Due to illness or injury, pt requires aid of BABS, DABBS ([id number]) supportive devices such as crutches, cane, wheelchairs, walkers, the use of special transportation or the assistance of another person to leave their place of residence. There is a normal inability to leave the home and doing so requires considerable and taxing effort. Other absences are for medical reasons / religious services and are infrequent or of short duration when for other reasons). o If current dressing causes regression in wound condition, may D/C ordered dressing product/s and apply Normal Saline Moist Dressing daily until next Wound Healing Center / Other MD appointment. Notify Wound Healing Center of regression in wound condition at [phone]. o Please direct any NON-WOUND related issues/requests for orders to patient's Primary Care Physician Wound #8 Right,Lateral Lower Leg o Continue Home Health Visits - [facility] to order appropriate wound care supplies for patient o Home Health Nurse may visit PRN to address patientos wound care needs. o FACE TO FACE ENCOUNTER: MEDICARE and MEDICAID PATIENTS: I certify that this patient is under my care and that I had a face-to-face encounter that meets the physician face-to-face encounter  requirements with this patient on this date. The encounter with the patient was in whole or in part for the following MEDICAL CONDITION: (primary reason for Home Healthcare) MEDICAL NECESSITY: I certify, that based on my findings, NURSING services are a medically necessary home health service. HOME BOUND STATUS: I certify that my clinical findings support that this patient is homebound (i.e., Due to illness or injury, pt requires aid of supportive devices such as crutches, cane, wheelchairs, walkers, the use of special transportation or the assistance of another person to leave their place of residence. There is a normal inability to leave the home and doing so requires considerable and taxing effort. Other absences are for medical reasons / religious services and are infrequent or of short duration when for other reasons). o If current dressing causes regression in wound condition, may D/C ordered dressing product/s and apply Normal Saline Moist Dressing daily until next Wound Healing Center / Other MD appointment. Notify Wound Healing Center of regression in wound condition at [phone]. o Please direct any NON-WOUND related issues/requests for orders to patient's Primary Care Physician Wound #9 Right Achilles o Continue  [facility] Health Visits - [facility] to order appropriate wound care supplies for patient o Home Health Nurse may visit PRN to address patientos wound care needs. o FACE TO FACE ENCOUNTER: MEDICARE and MEDICAID PATIENTS: I certify that this patient is under my care and that I had a face-to-face encounter that meets the physician face-to-face encounter requirements with this patient on this date. The encounter with the patient was in whole or in part for the following MEDICAL CONDITION: (primary reason for Home Healthcare) MEDICAL NECESSITY: I certify, that based on my findings, NURSING services are a medically necessary home health service. HOME BOUND STATUS: I certify  that my clinical findings support that this patient is homebound (i.e., Due to illness or injury, pt requires aid of supportive devices such as crutches, cane, wheelchairs, walkers, the use of special transportation or the assistance of another person to leave their place of residence. There is a normal inability to leave the home and doing so requires considerable and taxing effort. Other absences are for medical reasons / religious services and are infrequent or of short duration when for other reasons). o If current dressing causes regression in wound condition, may D/C ordered dressing product/s and apply Normal Saline Moist Dressing daily until next Wound Healing Center / Other MD appointment. Notify Wound Healing Center of regression in wound condition at [phone]. MYKERIA, GARMAN ([id number]) o Please direct any NON-WOUND related issues/requests for orders to patient's Primary Care Physician Negative Pressure Wound Therapy Wound #9 Right Achilles o Wound VAC settings at 125/130 mmHg continuous pressure. Use BLACK/GREEN foam to wound cavity. Use WHITE foam to fill any tunnel/s and/or undermining. Change VAC dressing 3 X WEEK. Change canister as indicated when full. Nurse may titrate settings and frequency of dressing changes as clinically indicated. Laboratory o Bacteria identified in Wound by Culture (MICRO) - midline abdomen oooo LOINC Code: 6462-6 oooo Convenience Name: Wound culture routine Electronic Signature(s) Signed: 12/17/2015 12:39:46 PM By: Curtis Sites Signed: 12/18/2015 3:12:28 PM By: Baltazar Najjar MD Entered By: Curtis Sites on 12/17/2015 09:56:51 [name] ([id number]) -------------------------------------------------------------------------------- Problem List Details Patient Name: JYA, HUGHSTON. Date of Service: 12/17/2015 8:00 AM Medical Record Patient Account Number: [id number] Number: Treating RN: Cortana, Vanderford 04/07/1934 (82 y.o. Other Clinician: Date of Birth/Sex: Female) Treating Regan Mcbryar Primary Care Physician/Extender: Rex Kras, Heidi Physician: Geri Seminole, Referring Physician: Marlou Sa in Treatment: 0 Active Problems ICD-10 Encounter Code Description Active Date Diagnosis E11.621 Type 2 diabetes mellitus with foot ulcer 12/17/2015 Yes S31.104A Unspecified open wound of abdominal wall, left lower 12/17/2015 Yes quadrant without penetration into peritoneal cavity, initial encounter E11.51 Type 2 diabetes mellitus with diabetic peripheral 12/17/2015 Yes angiopathy without gangrene Inactive Problems Resolved Problems Electronic Signature(s) Signed: 12/18/2015 3:12:28 PM By: Baltazar Najjar MD Entered By: Baltazar Najjar on 12/17/2015 10:16:33 [name] ([id number]) -------------------------------------------------------------------------------- Progress Note Details Patient Name: [name] Date of Service: 12/17/2015 8:00 AM Medical Record Patient Account Number: [id number] Number: Treating RN: Stephaie, Dardis 05/08/1934 (82 y.o. Other Clinician: Date of Birth/Sex: Female) Treating Kaelin Holford Primary Care Physician/Extender: Rex Kras, Heidi Physician: Geri Seminole, Referring Physician: Marlou Sa in Treatment: 0 Subjective Chief Complaint Information obtained from Patient Patients presents for treatment of an open diabetic ulcer, arterial ulcer and pressure ulcers to the right lower extremities which is a complex etiology for the last several months. He also has ulcers on her abdomen in the left lower quadrant and left suprapubic area which she's had for about 4 months.  12/17/15; patient returns to clinic apparently referred back from vascular surgery for ongoing wound care. History of Present Illness (HPI) The following HPI elements were documented for the patient's wound: Location: several wounds on the right lower  extremity including her right healed right posterior ankle and right lower third of the leg. She also has wounds on her left lower quadrant of the abdomen and the suprapubic area. Quality: Patient reports experiencing a sharp pain to affected area(s). Severity: Patient states wound are getting worse. Duration: Patient has had the wound for > 3 months prior to seeking treatment at the wound center Timing: Pain in wound is Intermittent (comes and goes Context: The wound appeared gradually over time Modifying Factors: Other treatment(s) tried include:he simply been admitted to the hospital 2 weeks ago and has had procedures done on her right lower extremity and had a blockage which we are trying to get some notes Associated Signs and Symptoms: Patient reports having increase discharge. 82 year old patient who is known to be diabetic, was referred to [place] by Dr. Gavin Potters for a right heel ulceration which she's had for a while. She was recently in hospital for a pneumonia and at that time and got delirious and was disoriented and sometime during this time developed a stage II ulcer on her right heel. Her past medical history is significant for bilateral pneumonia which was treated with injectable antibiotics and then to oral Levaquin which he has completed. She also has acute on chronic diastolic CHF, acute on chronic respiratory failure, end-stage renal disease on hemodialysis, atrial fibrillation, recent stroke, diabetes mellitus. The patient and her son are poor historians but from what I understand she was admitted to the hospital with an acute vascular compromise of her right lower extremity and Dr. Wyn Quaker has done a surgical procedure and we are trying to obtain these notes. There are also some vascular workup done and we will try and obtain these notes. the injury to the left lower quadrant of abdomen and the suprapubic area have been GENTRY, SEEBER ([id number]) there due to a bruise and have  been there for several months and no intervention has been done. 10/11/2015 -- on review of the electronics records it was noted that the patient was admitted to the hospital on 09/14/2015 with peripheral vascular disease with claudication, end-stage renal disease, pressure ulcer, chronic atrial fibrillation. She was seen by Dr. Wyn Quaker who did her right lower extremity angiogram , angioplasty of the right anterior tibial artery and thrombolysis with TPA of the right popliteal artery, and thrombectomy. She was seen by Dr. Wyn Quaker during this past week and he was pleased with the progress. He did say that if he took her to the operating room for any procedure he would debride the abdominal wound under anesthesia. She was also seen by Dr. Ether Griffins the podiatrist who thought that she may lose her right fourth toe at some stage may need an amputation of this. 10/21/2015 --patient known to Dr. Wyn Quaker and his last office visit from 10/04/2015 has been reviewed. 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 She also had a persistent ulceration of the right heel and markedly this area and a right third and fourth toe and a small scab on the calf but these are dry and seemed to be improving. Patient also has a left carotid endarterectomy and multiple interventions to a right brachiocephalic AV fistula. After the  visit he had recommended noninvasive studies to recheck her revascularization. He was off the impression that she would likely lose the right fourth toe and the third toe was likely to heal. He was concerned about underlying muscle necrosis on her right heel and midfoot. 11/01/2015 -- an echo done in January of this year showed her left ventricular ejection fraction to be about 50-55%. The patient was seen by the PA and Dr. Driscilla Grammes office and the plan was to take her to the operating room soon  to have a debridement under anesthesia for the abdominal wall wound, the Achilles tendon on the right leg and amputation of the right fourth toe. The daughter and the patient do not feel that they would be able to undergo hyperbaric oxygen therapy 5 days a week for 6 weeks. 12/17/15; this is a medically complex woman who I note was recently in this clinic however I was not involved with her care. She returns today with multiple wounds; a) she has a wound in the mid abdomen that is been there since March of this year. I note that she is been to the overall for debridement recently. The exact etiology of this wound is not really clear b) left lower quadrant abdominal wound had some sanguinous drainage when she came in here. The patient fell in January and thinks this may have been secondary to a hematoma. c): The patient has 3 wounds on her right leg including a small wound on the right mid calf, a large area over the Achilles which currently has a wound VAC for the last 6 weeks, also a smaller wound on the distal part of the right heel. As far as I understand most of these wounds are currently been dressed with's calcium alginate. According to her daughter the Achilles wound under the wound VAC is doing well d) the patient is had an amputation of her left fifth toe in January and the right fourth toe 6 weeks ago secondary to diabetic PAD e) the patient has chronic renal failure on dialysis for the last 2 years secondary to type 2 diabetes on insulin. The daughter's knowledge there is been no biopsy of the abdominal wounds given their current appearance and lack of undefined etiology at have to wonder about calciphylaxis. Wound History Patient presents with 4 open wounds that have been present for approximately unknown. Patient has been treating wounds in the following manner: NPWT and calcium alginate. Laboratory tests have not been JERMANI, PUND ([id number]) performed in the last month.  Patient reportedly has not tested positive for an antibiotic resistant organism. Patient reportedly has not tested positive for osteomyelitis. Patient reportedly has had testing performed to evaluate circulation in the legs. Patient History Information obtained from Patient. Allergies codeine, Iodinated Contrast Media - Oral and IV Dye, oxycodone, quinine Social History Never smoker, Marital Status - Widowed, Alcohol Use - Never, Drug Use - No History, Caffeine Use - Daily. Medical History Cardiovascular Patient has history of Hypertension Hospitalization/Surgery History - 09/16/2015, [facility], DVT. Medical And Surgical History Notes Respiratory chronic resp failure Neurologic CVA in January Review of Systems (ROS) Constitutional Symptoms (General Health) The patient has no complaints or symptoms. Eyes The patient has no complaints or symptoms. Ear/Nose/Mouth/Throat The patient has no complaints or symptoms. Hematologic/Lymphatic The patient has no complaints or symptoms. Respiratory The patient has no complaints or symptoms. Gastrointestinal The patient has no complaints or symptoms. Genitourinary Complains or has symptoms of Kidney failure/ Dialysis - ESRD on HD. Immunological The  patient has no complaints or symptoms. Integumentary (Skin) The patient has no complaints or symptoms. Musculoskeletal The patient has no complaints or symptoms. Neurologic The patient has no complaints or symptoms. Oncologic The patient has no complaints or symptoms. PHEBE, DETTMER ([id number]) Psychiatric The patient has no complaints or symptoms. Objective Constitutional Sitting or standing Blood Pressure is within target range for patient.. Pulse regular and within target range for patient.Marland Kitchen Respirations regular, non-labored and within target range.. Temperature is normal and within the target range for the patient.. Patient was not in any distress. Malodorous wounds however. Vitals  Time Taken: 8:24 AM, Height: 65 in, Source: Stated, Weight: 160 lbs, Source: Stated, BMI: 26.6, Pulse: 100 bpm, Respiratory Rate: 18 breaths/min, Blood Pressure: 132/63 mmHg. Neck Neck supple and symmetrical. No masses or crepitus. Respiratory Respiratory effort is easy and symmetric bilaterally. Rate is normal at rest and on room air.. Bilateral breath sounds are clear and equal in all lobes with no wheezes, rales or rhonchi.. Cardiovascular Heart rhythm and rate regular, without murmur or gallop. Patient appeared to be euvolemic. I could feel a femoral pulse on the right not the left. Pedal pulses absent bilaterally.. Gastrointestinal (GI) Other than the wounds in the mid abdomen and left lower quadrant no masses were noted. No liver or spleen enlargement or tenderness.. Lymphatic Nonpalpable in the cervical clavicular or axillary areas. Psychiatric No evidence of depression, anxiety, or agitation. Calm, cooperative, and communicative. Appropriate interactions and affect.. General Notes: Wound exam; Firstly on the abdomen in the lower midline and also in the left lower quadrant are large necrotic open areas. There was some sanguinous drainage coming out of the left lower quadrant. Midline wound was purulent culture obtained debridement of necrotic material clear. Right lateral calf was debridement of surface eschar this cleans up quite nicely. Debridement of the Achilles area was also done, there may be involved tendon at the lower aspect of this. Finally on the distal Achilles heel a small wound with necrotic material also debridement. Integumentary (Hair, Skin) MENA, SIMONIS ([id number]) Wound #10 status is Open. Original cause of wound was Gradually Appeared. The wound is located on the Right Calcaneus. The wound measures 1.3cm length x 1.8cm width x 0.2cm depth; 1.838cm^2 area and 0.368cm^3 volume. The wound is limited to skin breakdown. There is no tunneling or undermining  noted. There is a medium amount of serous drainage noted. The wound margin is flat and intact. There is no granulation within the wound bed. There is a large (67-100%) amount of necrotic tissue within the wound bed including Eschar and Adherent Slough. The periwound skin appearance exhibited: Maceration, Moist, Erythema. The periwound skin appearance did not exhibit: Callus, Crepitus, Excoriation, Fluctuance, Friable, Induration, Localized Edema, Rash, Scarring, Dry/Scaly, Atrophie Blanche, Cyanosis, Ecchymosis, Hemosiderin Staining, Mottled, Pallor, Rubor. The surrounding wound skin color is noted with erythema which is circumferential. Periwound temperature was noted as No Abnormality. The periwound has tenderness on palpation. Wound #11 status is Open. Original cause of wound was Trauma. The wound is located on the Midline Abdomen - Lower Quadrant. The wound measures 5.6cm length x 9cm width x 2cm depth; 39.584cm^2 area and 79.168cm^3 volume. The wound is limited to skin breakdown. There is no tunneling or undermining noted. There is a large amount of purulent drainage noted. The wound margin is flat and intact. There is no granulation within the wound bed. There is a large (67-100%) amount of necrotic tissue within the wound bed including Eschar and Adherent Slough. The periwound  skin appearance exhibited: Moist, Erythema. The periwound skin appearance did not exhibit: Callus, Crepitus, Excoriation, Fluctuance, Friable, Induration, Localized Edema, Rash, Scarring, Dry/Scaly, Maceration, Atrophie Blanche, Cyanosis, Ecchymosis, Hemosiderin Staining, Mottled, Pallor, Rubor. The surrounding wound skin color is noted with erythema which is circumferential. Periwound temperature was noted as No Abnormality. The periwound has tenderness on palpation. Wound #12 status is Open. Original cause of wound was Trauma. The wound is located on the Left Abdomen - Lower Quadrant. The wound measures 2.1cm  length x 10.7cm width x 2.2cm depth; 17.648cm^2 area and 38.825cm^3 volume. The wound is limited to skin breakdown. There is no tunneling or undermining noted. There is a large amount of purulent drainage noted. The wound margin is flat and intact. There is small (1-33%) red granulation within the wound bed. There is a large (67-100%) amount of necrotic tissue within the wound bed including Eschar and Adherent Slough. The periwound skin appearance exhibited: Moist, Erythema. The periwound skin appearance did not exhibit: Callus, Crepitus, Excoriation, Fluctuance, Friable, Induration, Localized Edema, Rash, Scarring, Dry/Scaly, Maceration, Atrophie Blanche, Cyanosis, Ecchymosis, Hemosiderin Staining, Mottled, Pallor, Rubor. The surrounding wound skin color is noted with erythema which is circumferential. Periwound temperature was noted as No Abnormality. The periwound has tenderness on palpation. Wound #8 status is Open. Original cause of wound was Gradually Appeared. The wound is located on the Right,Lateral Lower Leg. The wound measures 4cm length x 3.3cm width x 0.2cm depth; 10.367cm^2 area and 2.073cm^3 volume. The wound is limited to skin breakdown. There is no tunneling or undermining noted. There is a medium amount of serous drainage noted. The wound margin is flat and intact. There is medium (34-66%) red, pink granulation within the wound bed. There is a medium (34-66%) amount of necrotic tissue within the wound bed including Eschar and Adherent Slough. The periwound skin appearance exhibited: Maceration, Moist, Erythema. The periwound skin appearance did not exhibit: Callus, Crepitus, Excoriation, Fluctuance, Friable, Induration, Localized Edema, Rash, Scarring, Dry/Scaly, Atrophie Blanche, Cyanosis, Ecchymosis, Hemosiderin Staining, Mottled, Pallor, Rubor. The surrounding wound skin color is noted with erythema which is circumferential. Periwound temperature was noted as  No Abnormality. Wound #9 status is Open. Original cause of wound was Gradually Appeared. The wound is located on the Right Achilles. The wound measures 8.3cm length x 5cm width x 0.3cm depth; 32.594cm^2 area and 9.778cm^3 volume. The wound is limited to skin breakdown. There is no tunneling or undermining noted. Natalie RimaHOPKINS, Porchia Y. ([id number]) The wound margin is flat and intact. There is large (67-100%) red granulation within the wound bed. There is a small (1-33%) amount of necrotic tissue within the wound bed including Eschar and Adherent Slough. The periwound skin appearance exhibited: Maceration, Moist, Erythema. The periwound skin appearance did not exhibit: Callus, Crepitus, Excoriation, Fluctuance, Friable, Induration, Localized Edema, Rash, Scarring, Dry/Scaly, Atrophie Blanche, Cyanosis, Ecchymosis, Hemosiderin Staining, Mottled, Pallor, Rubor. The surrounding wound skin color is noted with erythema which is circumferential. The periwound has tenderness on palpation. Assessment Active Problems ICD-10 E11.621 - Type 2 diabetes mellitus with foot ulcer E11.52 - Type 2 diabetes mellitus with diabetic peripheral angiopathy with gangrene S31.104A - Unspecified open wound of abdominal wall, left lower quadrant without penetration into peritoneal cavity, initial encounter Procedures Wound #10 Wound #10 is an Arterial Insufficiency Ulcer located on the Right Calcaneus . There was a Skin/Subcutaneous Tissue Debridement ([id number]) debridement with total area of 2.34 sq cm performed by Maxwell CaulOBSON, Jsean Taussig G, MD. with the following instrument(s): Blade and Forceps to remove Viable tissue/material including  Blood Clots, Fibrin/Slough, and Eschar after achieving pain control using Lidocaine 4% Topical Solution. A time out was conducted prior to the start of the procedure. A Minimum amount of bleeding was controlled with Pressure. The procedure was tolerated well with a pain level of 0 throughout  and a pain level of 0 following the procedure. Post Debridement Measurements: 1.3cm length x 1.8cm width x 0.3cm depth; 0.551cm^3 volume. 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 There was a Skin/Subcutaneous Tissue Debridement ([id number]) debridement with total area of 50.4 sq cm performed by Maxwell Caul, MD. with the following instrument(s): Blade and Forceps to remove Viable tissue/material including Fibrin/Slough and Eschar after achieving pain control using Lidocaine 4% Topical Solution. A time out was conducted prior to the start of the procedure. A Minimum amount of bleeding was controlled with Pressure. The procedure was tolerated well with a pain level of 0 throughout and a pain level of 0 following the procedure. Post Debridement Measurements: 5.6cm length x 9cm width x 2cm depth; 79.168cm^3 volume. Post procedure Diagnosis Wound #11: Same as Pre-Procedure SULAY, BRYMER ([id number]) Wound #12 Wound #12 is an Arterial Insufficiency Ulcer located on the Left Abdomen - Lower Quadrant . There was a Skin/Subcutaneous Tissue Debridement ([id number]) debridement with total area of 22.47 sq cm performed by Maxwell Caul, MD. with the following instrument(s): Blade and Forceps to remove Viable tissue/material including Blood Clots, Fibrin/Slough, and Eschar after achieving pain control using Lidocaine 4% Topical Solution. A time out was conducted prior to the start of the procedure. A Minimum amount of bleeding was controlled with Pressure. The procedure was tolerated well with a pain level of 0 throughout and a pain level of 0 following the procedure. Post Debridement Measurements: 2.1cm length x 10.7cm width x 2.2cm depth; 38.825cm^3 volume. Post procedure Diagnosis Wound #12: Same as Pre-Procedure Wound #8 Wound #8 is an Arterial Insufficiency Ulcer  located on the Right,Lateral Lower Leg . There was a Skin/Subcutaneous Tissue Debridement ([id number]) debridement with total area of 13.2 sq cm performed by Maxwell Caul, MD. with the following instrument(s): Curette to remove Viable tissue/material including Blood Clots, Fibrin/Slough, and Eschar after achieving pain control using Lidocaine 4% Topical Solution. A time out was conducted prior to the start of the procedure. A Minimum amount of bleeding was controlled with Pressure. The procedure was tolerated well with a pain level of 0 throughout and a pain level of 0 following the procedure. Post Debridement Measurements: 4cm length x 3.3cm width x 0.3cm depth; 3.11cm^3 volume. Post procedure Diagnosis Wound #8: Same as Pre-Procedure Wound #9 Wound #9 is an Arterial Insufficiency Ulcer located on the Right Achilles . There was a Skin/Subcutaneous Tissue Debridement ([id number]) debridement with total area of 41.5 sq cm performed by Maxwell Caul, MD. with the following instrument(s): Curette to remove Viable tissue/material including Blood Clots, Fibrin/Slough, and Eschar after achieving pain control using Lidocaine 4% Topical Solution. A time out was conducted prior to the start of the procedure. A Minimum amount of bleeding was controlled with Pressure. The procedure was tolerated well with a pain level of 0 throughout and a pain level of 0 following the procedure. Post Debridement Measurements: 8.3cm length x 5cm width x 0.3cm depth; 9.778cm^3 volume. Post procedure Diagnosis Wound #9: Same as Pre-Procedure Plan Wound Cleansing: Wound #10 Right Calcaneus: Clean wound with Normal Saline. Wound #11 Midline Abdomen -  Lower Quadrant: Clean wound with Normal Saline. Wound #12 Left Abdomen - Lower QuadrantTYAUNA, LACAZE ([id number]) Clean wound with Normal Saline. Wound #8 Right,Lateral Lower Leg: Clean wound with Normal Saline. Wound #9 Right Achilles: Clean wound with  Normal Saline. Anesthetic: Wound #10 Right Calcaneus: Topical Lidocaine 4% cream applied to wound bed prior to debridement - in Wound Clinic Wound #11 Midline Abdomen - Lower Quadrant: Topical Lidocaine 4% cream applied to wound bed prior to debridement - in Wound Clinic Wound #12 Left Abdomen - Lower Quadrant: Topical Lidocaine 4% cream applied to wound bed prior to debridement - in Wound Clinic Wound #8 Right,Lateral Lower Leg: Topical Lidocaine 4% cream applied to wound bed prior to debridement - in Wound Clinic Wound #9 Right Achilles: Topical Lidocaine 4% cream applied to wound bed prior to debridement - in Wound Clinic Skin Barriers/Peri-Wound Care: Wound #10 Right Calcaneus: Skin Prep Wound #11 Midline Abdomen - Lower Quadrant: Skin Prep Wound #12 Left Abdomen - Lower Quadrant: Skin Prep Wound #8 Right,Lateral Lower Leg: Skin Prep Wound #9 Right Achilles: Skin Prep Primary Wound Dressing: Wound #10 Right Calcaneus: Santyl Ointment Wound #11 Midline Abdomen - Lower Quadrant: Santyl Ointment Wound #12 Left Abdomen - Lower Quadrant: Santyl Ointment Wound #8 Right,Lateral Lower Leg: Santyl Ointment Wound #9 Right Achilles: Saline moistened gauze - in wound clinic only Secondary Dressing: Wound #10 Right Calcaneus: ABD pad Dry Gauze - secure with tape Wound #11 Midline Abdomen - Lower Quadrant: ABD pad Dry Gauze - secure with tape Wound #12 Left Abdomen - Lower Quadrant: ABD pad Dry Gauze - secure with tape Wound #8 Right,Lateral Lower Leg: ABD pad Dry Gauze - secure with tape SHERRIL, SHIPMAN ([id number]) Wound #9 Right Achilles: ABD pad - in wound clinic only Dry Gauze - in wound clinic only Dressing Change Frequency: Wound #10 Right Calcaneus: Change Dressing Monday, Wednesday, Friday Wound #11 Midline Abdomen - Lower Quadrant: Change Dressing Monday, Wednesday, Friday Wound #12 Left Abdomen - Lower Quadrant: Change Dressing Monday, Wednesday,  Friday Wound #8 Right,Lateral Lower Leg: Change Dressing Monday, Wednesday, Friday Wound #9 Right Achilles: Change Dressing Monday, Wednesday, Friday Follow-up Appointments: Wound #10 Right Calcaneus: Return Appointment in 1 week. Wound #11 Midline Abdomen - Lower Quadrant: Return Appointment in 1 week. Wound #12 Left Abdomen - Lower Quadrant: Return Appointment in 1 week. Wound #8 Right,Lateral Lower Leg: Return Appointment in 1 week. Wound #9 Right Achilles: Return Appointment in 1 week. Home Health: Wound #10 Right Calcaneus: Continue Home Health Visits - [facility] to order appropriate wound care supplies for patient Home Health Nurse may visit PRN to address patient s wound care needs. FACE TO FACE ENCOUNTER: MEDICARE and MEDICAID PATIENTS: I certify that this patient is under my care and that I had a face-to-face encounter that meets the physician face-to-face encounter requirements with this patient on this date. The encounter with the patient was in whole or in part for the following MEDICAL CONDITION: (primary reason for Home Healthcare) MEDICAL NECESSITY: I certify, that based on my findings, NURSING services are a medically necessary home health service. HOME BOUND STATUS: I certify that my clinical findings support that this patient is homebound (i.e., Due to illness or injury, pt requires aid of supportive devices such as crutches, cane, wheelchairs, walkers, the use of special transportation or the assistance of another person to leave their place of residence. There is a normal inability to leave the home and doing so requires considerable and taxing effort. Other absences are  for medical reasons / religious services and are infrequent or of short duration when for other reasons). If current dressing causes regression in wound condition, may D/C ordered dressing product/s and apply Normal Saline Moist Dressing daily until next Wound Healing Center / Other MD appointment.  Notify Wound Healing Center of regression in wound condition at [phone]. Please direct any NON-WOUND related issues/requests for orders to patient's Primary Care Physician Wound #11 Midline Abdomen - Lower Quadrant: Continue Home Health Visits - [facility] to order appropriate wound care supplies for patient Home Health Nurse may visit PRN to address patient s wound care needs. FACE TO FACE ENCOUNTER: MEDICARE and MEDICAID PATIENTS: I certify that this patient is under my care and that I had a face-to-face encounter that meets the physician face-to-face encounter requirements with this patient on this date. The encounter with the patient was in whole or in part for the following MEDICAL CONDITION: (primary reason for Home Healthcare) MEDICAL NECESSITY: I certify, that based on my findings, NURSING services are a medically necessary home health service. HOME [place], EVALETTE MONTROSE ([id number]) BOUND STATUS: I certify that my clinical findings support that this patient is homebound (i.e., Due to illness or injury, pt requires aid of supportive devices such as crutches, cane, wheelchairs, walkers, the use of special transportation or the assistance of another person to leave their place of residence. There is a normal inability to leave the home and doing so requires considerable and taxing effort. Other absences are for medical reasons / religious services and are infrequent or of short duration when for other reasons). If current dressing causes regression in wound condition, may D/C ordered dressing product/s and apply Normal Saline Moist Dressing daily until next Wound Healing Center / Other MD appointment. Notify Wound Healing Center of regression in wound condition at [phone]. Please direct any NON-WOUND related issues/requests for orders to patient's Primary Care Physician Wound #12 Left Abdomen - Lower Quadrant: Continue Home Health Visits - [facility] to order appropriate wound care supplies for  patient Home Health Nurse may visit PRN to address patient s wound care needs. FACE TO FACE ENCOUNTER: MEDICARE and MEDICAID PATIENTS: I certify that this patient is under my care and that I had a face-to-face encounter that meets the physician face-to-face encounter requirements with this patient on this date. The encounter with the patient was in whole or in part for the following MEDICAL CONDITION: (primary reason for Home Healthcare) MEDICAL NECESSITY: I certify, that based on my findings, NURSING services are a medically necessary home health service. HOME BOUND STATUS: I certify that my clinical findings support that this patient is homebound (i.e., Due to illness or injury, pt requires aid of supportive devices such as crutches, cane, wheelchairs, walkers, the use of special transportation or the assistance of another person to leave their place of residence. There is a normal inability to leave the home and doing so requires considerable and taxing effort. Other absences are for medical reasons / religious services and are infrequent or of short duration when for other reasons). If current dressing causes regression in wound condition, may D/C ordered dressing product/s and apply Normal Saline Moist Dressing daily until next Wound Healing Center / Other MD appointment. Notify Wound Healing Center of regression in wound condition at [phone]. Please direct any NON-WOUND related issues/requests for orders to patient's Primary Care Physician Wound #8 Right,Lateral Lower Leg: Continue Home Health Visits - [facility] to order appropriate wound care supplies for patient Home Health Nurse may visit  PRN to address patient s wound care needs. FACE TO FACE ENCOUNTER: MEDICARE and MEDICAID PATIENTS: I certify that this patient is under my care and that I had a face-to-face encounter that meets the physician face-to-face encounter requirements with this patient on this date. The encounter with the  patient was in whole or in part for the following MEDICAL CONDITION: (primary reason for Home Healthcare) MEDICAL NECESSITY: I certify, that based on my findings, NURSING services are a medically necessary home health service. HOME BOUND STATUS: I certify that my clinical findings support that this patient is homebound (i.e., Due to illness or injury, pt requires aid of supportive devices such as crutches, cane, wheelchairs, walkers, the use of special transportation or the assistance of another person to leave their place of residence. There is a normal inability to leave the home and doing so requires considerable and taxing effort. Other absences are for medical reasons / religious services and are infrequent or of short duration when for other reasons). If current dressing causes regression in wound condition, may D/C ordered dressing product/s and apply Normal Saline Moist Dressing daily until next Wound Healing Center / Other MD appointment. Notify Wound Healing Center of regression in wound condition at [phone]. Please direct any NON-WOUND related issues/requests for orders to patient's Primary Care Physician Wound #9 Right Achilles: Continue Home Health Visits - [facility] to order appropriate wound care supplies for patient Home Health Nurse may visit PRN to address patient s wound care needs. FACE TO FACE ENCOUNTER: MEDICARE and MEDICAID PATIENTS: I certify that this patient is under my care and that I had a face-to-face encounter that meets the physician face-to-face encounter requirements with this patient on this date. The encounter with the patient was in whole or in part for the following MEDICAL CONDITION: (primary reason for Home Healthcare) MEDICAL NECESSITY: I certify, that based on my findings, NURSING services are a medically necessary home health service. HOME [place], ARIAHNA SMIDDY ([id number]) BOUND STATUS: I certify that my clinical findings support that this patient is  homebound (i.e., Due to illness or injury, pt requires aid of supportive devices such as crutches, cane, wheelchairs, walkers, the use of special transportation or the assistance of another person to leave their place of residence. There is a normal inability to leave the home and doing so requires considerable and taxing effort. Other absences are for medical reasons / religious services and are infrequent or of short duration when for other reasons). If current dressing causes regression in wound condition, may D/C ordered dressing product/s and apply Normal Saline Moist Dressing daily until next Wound Healing Center / Other MD appointment. Notify Wound Healing Center of regression in wound condition at [phone]. Please direct any NON-WOUND related issues/requests for orders to patient's Primary Care Physician Negative Pressure Wound Therapy: Wound #9 Right Achilles: Wound VAC settings at 125/130 mmHg continuous pressure. Use BLACK/GREEN foam to wound cavity. Use WHITE foam to fill any tunnel/s and/or undermining. Change VAC dressing 3 X WEEK. Change canister as indicated when full. Nurse may titrate settings and frequency of dressing changes as clinically indicated. Laboratory ordered were: Wound culture routine - midline abdomen #1 I forecast using Santyl ointment on all wounds for the next 2-3 weeks. If we can get a better surface on the abdominal wounds consider Hydrofera Blue. Her insurance refused wound vacs on these wounds. #2 question of calciphylaxis I'll see if I can find anything on this. Consider a punch biopsy #3 wounds on the right leg  posterior calf and 2 on the Achilles area. These all require some debridement. If the wound VAC stalls on the Achilles area consider Apligraf still all of these areas #4 I will look through [facility] to see if we can find information. Specifically he has there been imaging studies of the heel, biopsy or consideration of biopsy on the  abdominal wounds if there is not another good explanation for these. #5 the patient has had revascularization procedures we will need to see what residual blood flow she is felt to have. #6 at this point I can see no good reason for HBO. 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 Electronic Signature(s) Signed: 12/18/2015 3:12:28 PM By: Baltazar Najjar MD Entered By: Baltazar Najjar on 12/17/2015 10:15:23 [name] ([id number]) -------------------------------------------------------------------------------- ROS/PFSH Details Patient Name: [name] Date of Service: 12/17/2015 8:00 AM Medical Record Patient Account Number: [id number] Number: Treating RN: Lashika, Erker 06/27/1933 (82 y.o. Other Clinician: Date of Birth/Sex: Female) Treating Tuyet Bader Primary Care Physician/Extender: Rex Kras, Heidi Physician: Geri Seminole, Referring Physician: Marlou Sa in Treatment: 0 Information Obtained From Patient Wound History Do you currently have one or more open woundso Yes How many open wounds do you currently haveo 4 Approximately how long have you had your woundso unknown How have you been treating your wound(s) until nowo NPWT and calcium alginate Has your wound(s) ever healed and then re-openedo No Have you had any lab work done in the past montho No Have you tested positive for an antibiotic resistant organism (MRSA, No VRE)o Have you tested positive for osteomyelitis (bone infection)o No Have you had any tests for circulation on your legso Yes Who ordered the testo [facility] Where was the test doneo [facility] Genitourinary Complaints and Symptoms: Positive for: Kidney failure/ Dialysis - ESRD on HD Constitutional Symptoms (General Health) Complaints and Symptoms: No Complaints or Symptoms Eyes Complaints and Symptoms: No Complaints or  Symptoms Ear/Nose/Mouth/Throat Complaints and Symptoms: No Complaints or Symptoms AZELIA, REIGER ([id number]) Hematologic/Lymphatic Complaints and Symptoms: No Complaints or Symptoms Respiratory Complaints and Symptoms: No Complaints or Symptoms Medical History: Past Medical History Notes: chronic resp failure Cardiovascular Medical History: Positive for: Arrhythmia - a fib; Congestive Heart Failure; Hypertension Gastrointestinal Complaints and Symptoms: No Complaints or Symptoms Endocrine Medical History: Positive for: Type II Diabetes Treated with: Insulin Immunological Complaints and Symptoms: No Complaints or Symptoms Integumentary (Skin) Complaints and Symptoms: No Complaints or Symptoms Musculoskeletal Complaints and Symptoms: No Complaints or Symptoms Neurologic Complaints and Symptoms: No Complaints or Symptoms Medical History: Past Medical History Notes: JERALD, VILLALONA ([id number]) CVA in January Oncologic Complaints and Symptoms: No Complaints or Symptoms Psychiatric Complaints and Symptoms: No Complaints or Symptoms Immunizations Immunization Notes: up to date Hospitalization / Surgery History Name of Hospital Purpose of Hospitalization/Surgery Date [facility] DVT 09/16/2015 Family and Social History Never smoker; Marital Status - Widowed; Alcohol Use: Never; Drug Use: No History; Caffeine Use: Daily; Financial Concerns: No; Food, Clothing or Shelter Needs: No; Support System Lacking: No; Transportation Concerns: No; Advanced Directives: No; Patient does not want information on Advanced Directives Electronic Signature(s) Signed: 12/17/2015 12:39:46 PM By: Curtis Sites Signed: 12/18/2015 3:12:28 PM By: Baltazar Najjar MD Entered By: Curtis Sites on 12/17/2015 08:29:20 [name] ([id number]) -------------------------------------------------------------------------------- SuperBill Details Patient Name: [name]. Date of Service:  12/17/2015 Medical Record Patient Account Number: [id number] Number: Treating RN: Francesco Sor,  [place] June 11, 1933 (82 y.o. Other Clinician: Date of Birth/Sex: Female) Treating Marsella Suman Primary Care Physician/Extender: Rex Kras, Heidi Physician: Tania Ade in Treatment: 0 Geri Seminole, Referring Physician: Cala Bradford Diagnosis Coding ICD-10 Codes Code Description E11.621 Type 2 diabetes mellitus with foot ulcer Unspecified open wound of abdominal wall, left lower quadrant without penetration into S31.104A peritoneal cavity, initial encounter E11.51 Type 2 diabetes mellitus with diabetic peripheral angiopathy without gangrene Facility Procedures CPT4: Description Modifier Quantity Code [id number] - WOUND CARE VISIT-LEV 3 EST PT 1 CPT4: 54098119 11042 - DEB SUBQ TISSUE 20 SQ CM/< 1 ICD-10 Description Diagnosis S31.104A Unspecified open wound of abdominal wall, left lower quadrant without penetration into peritoneal cavity, initial encounter CPT4: 14782956 11045 - DEB SUBQ TISS EA ADDL 20CM 6 ICD-10 Description Diagnosis E11.621 Type 2 diabetes mellitus with foot ulcer Physician Procedures CPT4: Description Modifier Quantity Code [id number] 99215 - WC PHYS LEVEL 5 - EST PT 25 1 ICD-10 Description Diagnosis S31.104A Unspecified open wound of abdominal wall, left lower quadrant without penetration into peritoneal cavity, initial encounter CPT4: 7846962 11042 - WC PHYS SUBQ TISS 20 SQ CM 1 JUNIA, NYGREN ([id number]) Electronic Signature(s) Signed: 12/18/2015 3:12:28 PM By: Baltazar Najjar MD Entered By: Baltazar Najjar on 12/17/2015 10:18:13

## 2015-12-19 NOTE — Progress Notes (Signed)
[name] ([id number]) Visit Report for 12/17/2015 Allergy List Details Patient Name: [name], [name]. Date of Service: 12/17/2015 8:00 AM Medical Record Patient Account Number: [id number] Number: Treating RN: Jeaneen, Cala 01/20/1934 (82 [name] Other Clinician: Date of Birth/Sex: Female) Treating ROBSON, MICHAEL Primary Care Physician: [name] Physician/Extender: Linus Mako, Referring Physician: Marlou Sa in Treatment: 0 Allergies Active Allergies codeine Iodinated Contrast Media - Oral and IV Dye oxycodone quinine Allergy Notes Electronic Signature(s) Signed: 12/17/2015 12:39:46 PM By: Curtis Sites Entered By: Curtis Sites on 12/17/2015 08:26:00 [name] ([id number]) -------------------------------------------------------------------------------- Arrival Information Details Patient Name: [name]. Date of Service: 12/17/2015 8:00 AM Medical Record Patient Account Number: [id number] Number: Treating RN: Ngozi, Alvidrez [birth year] (82 [name] Other Clinician: Date of Birth/Sex: Female) Treating ROBSON, MICHAEL Primary Care Physician: [name] Physician/Extender: Linus Mako, Referring Physician: Marlou Sa in Treatment: 0 Visit Information Patient Arrived: Wheel Chair Arrival Time: 08:21 Accompanied By: dtr Transfer Assistance: Manual Patient Identification Verified: Yes Secondary Verification Process Yes Completed: Patient Has Alerts: Yes Patient Alerts: Patient on Blood Thinner DMII Warfarin ABI [place] Bilateral NO BP RIGHT ARM History Since Last Visit Added or deleted any medications: No Any new allergies or adverse reactions: No Had a fall or experienced change in activities of daily living that may affect risk of falls: No Signs or symptoms of abuse/neglect since last visito No Hospitalized since last visit: No Pain Present Now: No Electronic Signature(s) Signed: 12/17/2015 12:39:46 PM By:  Curtis Sites Entered By: Curtis Sites on 12/17/2015 08:23:31 [name] ([id number]) -------------------------------------------------------------------------------- Clinic Level of Care Assessment Details Patient Name: [name]. Date of Service: 12/17/2015 8:00 AM Medical Record Patient Account Number: [id number] Number: Treating RN: Raia, Amico 1933/10/09 (82 [name] Other Clinician: Date of Birth/Sex: Female) Treating ROBSON, MICHAEL Primary Care Physician: [name] Physician/Extender: Linus Mako, Referring Physician: Marlou Sa in Treatment: 0 Clinic Level of Care Assessment Items TOOL 1 Quantity Score []  - Use when EandM and Procedure is performed on INITIAL visit 0 ASSESSMENTS - Nursing Assessment / Reassessment X - General Physical Exam (combine w/ comprehensive assessment (listed just 1 20 below) when performed on new pt. evals) X - Comprehensive Assessment (HX, ROS, Risk Assessments, Wounds Hx, etc.) 1 25 ASSESSMENTS - Wound and Skin Assessment / Reassessment []  - Dermatologic / Skin Assessment (not related to wound area) 0 ASSESSMENTS - Ostomy and/or Continence Assessment and Care []  - Incontinence Assessment and Management 0 []  - Ostomy Care Assessment and Management (repouching, etc.) 0 PROCESS - Coordination of Care X - Simple Patient / Family Education for ongoing care 1 15 []  - Complex (extensive) Patient / Family Education for ongoing care 0 X - Staff obtains Chiropractor, Records, Test Results / Process Orders 1 10 []  - Staff telephones HHA, Nursing Homes / Clarify orders / etc 0 []  - Routine Transfer to another Facility (non-emergent condition) 0 []  - Routine Hospital Admission (non-emergent condition) 0 X - New Admissions / Manufacturing engineer / Ordering NPWT, Apligraf, etc. 1 15 []  - Emergency Hospital Admission (emergent condition) 0 PROCESS - Special Needs []  - Pediatric / Minor Patient Management 0 [name] ([id number]) []  - Isolation Patient Management 0 []  - Hearing / Language / Visual special needs 0 []  - Assessment of Community assistance (transportation, D/C planning, etc.) 0 []  - Additional assistance / Altered mentation 0 []  - Support Surface(s) Assessment (bed, cushion, seat, etc.) 0 INTERVENTIONS - Miscellaneous []  - External ear exam 0 []  -  Patient Transfer (multiple staff / Nurse, adult / Similar devices) 0 []  - Simple Staple / Suture removal (25 or less) 0 []  - Complex Staple / Suture removal (26 or more) 0 []  - Hypo/Hyperglycemic Management (do not check if billed separately) 0 []  - Ankle / Brachial Index (ABI) - do not check if billed separately 0 Has the patient been seen at the hospital within the last three years: Yes Total Score: 85 Level Of Care: New/Established - Level 3 Electronic Signature(s) Signed: 12/17/2015 12:39:46 PM By: Curtis Sites Entered By: Curtis Sites on 12/17/2015 09:40:16 [name] ([id number]) -------------------------------------------------------------------------------- Encounter Discharge Information Details Patient Name: [name]. Date of Service: 12/17/2015 8:00 AM Medical Record Patient Account Number: [id number] Number: Treating RN: Nikky, Duba [birth year] (82 [name] Other Clinician: Date of Birth/Sex: Female) Treating ROBSON, MICHAEL Primary Care Physician: [name] Physician/Extender: Linus Mako, Referring Physician: Marlou Sa in Treatment: 0 Encounter Discharge Information Items Discharge Pain Level: 0 Discharge Condition: Stable Ambulatory Status: Wheelchair Discharge Destination: Home Transportation: Private Auto Accompanied By: dtr Schedule Follow-up Appointment: Yes Medication Reconciliation completed and provided to Patient/Care No [name]: Provided on Clinical Summary of Care: 12/17/2015 Form Type Recipient Paper Patient [facility] Electronic Signature(s) Signed: 12/17/2015 10:12:36  AM By: Gwenlyn Perking Entered By: Gwenlyn Perking on 12/17/2015 10:12:36 [name] ([id number]) -------------------------------------------------------------------------------- Lower Extremity Assessment Details Patient Name: [name] Date of Service: 12/17/2015 8:00 AM Medical Record Patient Account Number: [id number] Number: Treating RN: Laparis, Durrett 1933/11/20 (82 [name] Other Clinician: Date of Birth/Sex: Female) Treating ROBSON, MICHAEL Primary Care Physician: [name] Physician/Extender: Linus Mako, Referring Physician: Marlou Sa in Treatment: 0 Edema Assessment Assessed: [Left: No] [Right: No] Edema: [Left: No] [Right: No] Calf Left: Right: Point of Measurement: 33 cm From Medial Instep 31.2 cm 32.2 cm Ankle Left: Right: Point of Measurement: 10 cm From Medial Instep 20.1 cm 20.3 cm Vascular Assessment Pulses: Posterior Tibial Palpable: [Left:Yes] [Right:Yes] Doppler: [Left:Monophasic] [Right:Monophasic] Dorsalis Pedis Palpable: [Left:Yes] [Right:Yes] Doppler: [Left:Monophasic] [Right:Monophasic] Extremity colors, hair growth, and conditions: Extremity Color: [Left:Hyperpigmented] [Right:Hyperpigmented] Hair Growth on Extremity: [Left:No] [Right:No] Temperature of Extremity: [Left:Cool] [Right:Cool] Capillary Refill: [Left:< 3 seconds] [Right:< 3 seconds] Notes ABI [place] Bilateral > 220 Electronic Signature(s) Signed: 12/17/2015 12:39:46 PM By: Curtis Sites Entered By: Curtis Sites on 12/17/2015 08:45:21 [name], [name] ([id number]) Abbe [place], Vinetta Bergamo ([id number]) -------------------------------------------------------------------------------- Multi Wound Chart Details Patient Name: [name] Date of Service: 12/17/2015 8:00 AM Medical Record Patient Account Number: [id number] Number: Treating RN: Shelbia, Scinto 1934-03-06 (82 [name] 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 Comorbid History: Arrhythmia, Congestive Arrhythmia, Congestive Arrhythmia, Congestive Heart Failure, Heart Failure, Heart Failure, Hypertension, Type II Hypertension, Type II Hypertension, Type II Diabetes Diabetes Diabetes Date Acquired: 06/02/2015 08/12/2015 06/10/2015 Weeks of Treatment: 0 0 0 Wound Status: Open Open Open Pending Amputation on Yes No No Presentation: Measurements L x W x D 1.3x1.8x0.2 5.6x9x2 2.1x10.7x2.2 (cm) Area (cm) : 1.838 39.584 17.648 Volume (cm) : 0.368 79.168 38.825 % Reduction in Area: N/A N/A N/A % Reduction in Volume: N/A N/A N/A Classification: Bick,  Vinetta Bergamo ([id number]) Full Thickness Without Full Thickness Without Full Thickness Without Exposed Support Exposed Support Exposed Support Structures Structures Structures HBO Classification: Grade 1 N/A N/A Exudate Amount: Medium Large Large Exudate Type: Serous Purulent Purulent Exudate Color: amber yellow, brown, green yellow, brown, green Foul Odor After Yes Yes Yes Cleansing: Odor Anticipated Due to No No No Product Use: Wound Margin: Flat and Intact Flat and Intact Flat and Intact Granulation Amount: None Present (0%) None Present (0%) Small (1-33%) Granulation Quality: N/A N/A Red Necrotic Amount: Large (67-100%) Large (67-100%) Large (67-100%) Necrotic Tissue: Eschar, Adherent Slough Eschar, Adherent Slough Eschar, Adherent  Slough Exposed Structures: Fascia: No Fascia: No Fascia: No Fat: No Fat: No Fat: No Tendon: No Tendon: No Tendon: No Muscle: No Muscle: No Muscle: No Joint: No Joint: No Joint: No Bone: No Bone: No Bone: No Limited to Skin Limited to Skin Limited to Skin Breakdown Breakdown Breakdown Epithelialization: None None None Periwound Skin Texture: Edema: No Edema: No Edema: No Excoriation: No Excoriation: No Excoriation: No Induration: No Induration: No Induration: No Callus: No Callus: No Callus: No Crepitus: No Crepitus: No Crepitus: No Fluctuance: No Fluctuance: No Fluctuance: No Friable: No Friable: No Friable: No Rash: No Rash: No Rash: No Scarring: No Scarring: No Scarring: No Periwound Skin Maceration: Yes Moist: Yes Moist: Yes Moisture: Moist: Yes Maceration: No Maceration: No Dry/Scaly: No Dry/Scaly: No Dry/Scaly: No Periwound Skin Color: Erythema: Yes Erythema: Yes Erythema: Yes Atrophie Blanche: No Atrophie Blanche: No Atrophie Blanche: No Cyanosis: No Cyanosis: No Cyanosis: No Ecchymosis: No Ecchymosis: No Ecchymosis: No Hemosiderin Staining: No Hemosiderin Staining: No Hemosiderin Staining: No Mottled: No Mottled: No Mottled: No Pallor: No Pallor: No Pallor: No Rubor: No Rubor: No Rubor: No Erythema Location: Circumferential Circumferential Circumferential Temperature: No Abnormality No Abnormality No Abnormality Tenderness on Yes Yes Yes Palpation: Wound Preparation: [name], [name] ([id number]) Ulcer Cleansing: Ulcer Cleansing: Ulcer Cleansing: Rinsed/Irrigated with Rinsed/Irrigated with Rinsed/Irrigated with Saline Saline Saline Topical Anesthetic Topical Anesthetic Topical Anesthetic Applied: Other: lidocaine Applied: Other: lidocaine Applied: Other: lidocaine 4% 4% 4% Wound Number: [id number] N/A Photos: N/A Wound Location: Right Lower Leg - Lateral Right Achilles N/A Wounding Event: Gradually Appeared Gradually  Appeared N/A 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 N/A Comorbid History: Arrhythmia, Congestive Arrhythmia, Congestive N/A Heart Failure, Heart Failure, Hypertension, Type II Hypertension, Type II Diabetes Diabetes Date Acquired: 06/02/2015 06/02/2015 N/A Weeks of Treatment: 0 0 N/A Wound Status: Open Open N/A Pending Amputation on Yes Yes N/A Presentation: Measurements L x W x D 4x3.3x0.2 8.3x5x0.3 N/A (cm) Area (cm) : 10.367 32.594 N/A Volume (cm) : 2.073 9.778 N/A % Reduction in Area: 0.00% N/A N/A % Reduction in Volume: 0.00% N/A N/A Classification: Full Thickness Without Full Thickness Without N/A Exposed Support Exposed Support Structures Structures HBO Classification: Grade 1 Grade 1 N/A Exudate Amount: Medium N/A N/A Exudate Type: Serous N/A N/A Exudate Color: amber N/A N/A Foul Odor After Yes Yes N/A Cleansing: Odor Anticipated Due to No No N/A Product Use: Wound Margin: Flat and Intact Flat and Intact N/A Granulation Amount: Medium (34-66%) Large (67-100%) N/A Granulation Quality: Red, Pink Red N/A Necrotic Amount: Medium (34-66%) Small (1-33%) N/A [name], [name] ([id number]) Necrotic Tissue: Eschar, Adherent Slough Eschar, Adherent Slough N/A Exposed Structures: Fascia: No Fascia: No N/A Fat: No Fat: No Tendon: No Tendon: No Muscle: No Muscle: No Joint: No Joint: No Bone: No Bone: No Limited to Skin Limited to Skin Breakdown Breakdown Epithelialization: None None N/A Periwound  Skin Texture: Edema: No Edema: No N/A Excoriation: No Excoriation: No Induration: No Induration: No Callus: No Callus: No Crepitus: No Crepitus: No Fluctuance: No Fluctuance: No Friable: No Friable: No Rash: No Rash: No Scarring: No Scarring: No Periwound Skin Maceration: Yes Maceration: Yes N/A Moisture: Moist: Yes Moist: Yes Dry/Scaly: No Dry/Scaly: No Periwound Skin Color: Erythema: Yes Erythema: Yes N/A Atrophie  Blanche: No Atrophie Blanche: No Cyanosis: No Cyanosis: No Ecchymosis: No Ecchymosis: No Hemosiderin Staining: No Hemosiderin Staining: No Mottled: No Mottled: No Pallor: No Pallor: No Rubor: No Rubor: No Erythema Location: Circumferential Circumferential N/A Temperature: No Abnormality N/A N/A Tenderness on No Yes N/A Palpation: Wound Preparation: Ulcer Cleansing: Ulcer Cleansing: N/A Rinsed/Irrigated with Rinsed/Irrigated with Saline Saline Topical Anesthetic Topical Anesthetic Applied: Other: lidocaine Applied: Other: lidocaine 4% 4% Treatment Notes Electronic Signature(s) Signed: 12/17/2015 12:39:46 PM By: Curtis Sites Entered By: Curtis Sites on 12/17/2015 09:21:25 [name] ([id number]) -------------------------------------------------------------------------------- Multi-Disciplinary Care Plan Details Patient Name: [name], [name]. Date of Service: 12/17/2015 8:00 AM Medical Record Patient Account Number: [id number] Number: Treating RN: Billee, Balcerzak [birth year] (82 [name] Other Clinician: Date of Birth/Sex: Female) Treating ROBSON, MICHAEL Primary Care Physician: [name] Physician/Extender: Linus Mako, Referring Physician: Marlou Sa in Treatment: 0 Active Inactive Abuse / Safety / Falls / Self Care Management Nursing Diagnoses: Impaired physical mobility Potential for falls Goals: Patient will remain injury free Date Initiated: 12/17/2015 Goal Status: Active Interventions: Assess fall risk on admission and as needed Notes: Wound/Skin Impairment Nursing Diagnoses: Impaired tissue integrity Goals: Patient/caregiver will verbalize understanding of skin care regimen Date Initiated: 12/17/2015 Goal Status: Active Ulcer/skin breakdown will have a volume reduction of 30% by week 4 Date Initiated: 12/17/2015 Goal Status: Active Ulcer/skin breakdown will have a volume reduction of 50% by week 8 Date Initiated:  12/17/2015 Goal Status: Active Ulcer/skin breakdown will have a volume reduction of 80% by week 12 Date Initiated: 12/17/2015 Goal Status: Active [name], [name] ([id number]) Ulcer/skin breakdown will heal within 14 weeks Date Initiated: 12/17/2015 Goal Status: Active Interventions: Assess patient/caregiver ability to obtain necessary supplies Assess patient/caregiver ability to perform ulcer/skin care regimen upon admission and as needed Assess ulceration(s) every visit Notes: Electronic Signature(s) Signed: 12/17/2015 12:39:46 PM By: Curtis Sites Entered By: Curtis Sites on 12/17/2015 09:20:37 [name] ([id number]) -------------------------------------------------------------------------------- Pain Assessment Details Patient Name: [name] Date of Service: 12/17/2015 8:00 AM Medical Record Patient Account Number: [id number] Number: Treating RN: Dorreen, Valiente 01-24-1934 (82 [name] Other Clinician: Date of Birth/Sex: Female) Treating ROBSON, MICHAEL Primary Care Physician: [name] Physician/Extender: Linus Mako, Referring Physician: Marlou Sa in Treatment: 0 Active Problems Location of Pain Severity and Description of Pain Patient Has Paino No Site Locations Pain Management and Medication Current Pain Management: Notes Topical or injectable lidocaine is offered to patient for acute pain when surgical debridement is performed. If needed, Patient is instructed to use over the counter pain medication for the following 24-48 hours after debridement. Wound care MDs do not prescribed pain medications. Patient has chronic pain or uncontrolled pain. Patient has been instructed to make an appointment with their Primary Care Physician for pain management. Electronic Signature(s) Signed: 12/17/2015 12:39:46 PM By: Curtis Sites Entered By: Curtis Sites on 12/17/2015 08:23:49 [name]  ([id number]) -------------------------------------------------------------------------------- Patient/Caregiver Education Details Patient Name: [name] Date of Service: 12/17/2015 8:00 AM Medical Record Patient Account Number: [id number] Number: Treating RN: Cataleya, Cristina 11-Jul-1933 (82 [name] Other Clinician: Date of Birth/Gender: Female) Treating ROBSON, MICHAEL Primary  Care Physician: [name] Physician/Extender: Linus Mako, Weeks in Treatment: 0 Referring Physician: [facility] Education Assessment Education Provided To: Patient and Caregiver Education Topics Provided Wound/Skin Impairment: Handouts: Other: wound care as ordered Methods: Demonstration, Explain/Verbal Responses: State content correctly Electronic Signature(s) Signed: 12/17/2015 12:39:46 PM By: Curtis Sites Entered By: Curtis Sites on 12/17/2015 09:58:06 [name] ([id number]) -------------------------------------------------------------------------------- Wound Assessment Details Patient Name: [name]. Date of Service: 12/17/2015 8:00 AM Medical Record Patient Account Number: [id number] Number: Treating RN: Luva, Metzger August 06, 1933 (82 [name] Other Clinician: Date of Birth/Sex: Female) Treating ROBSON, MICHAEL Primary Care Physician: [name] Physician/Extender: Linus Mako, Referring Physician: Marlou Sa in Treatment: 0 Wound Status Wound Number: 10 Primary Arterial Insufficiency Ulcer Etiology: Wound Location: Right Calcaneus Wound Open Wounding Event: Gradually Appeared Status: Date Acquired: 06/02/2015 Comorbid Arrhythmia, Congestive Heart Failure, Weeks Of Treatment: 0 History: Hypertension, Type II Diabetes Clustered Wound: No Pending Amputation On Presentation Photos Wound Measurements Length: (cm) 1.3 % Reduction in Width: (cm) 1.8 % Reduction in Depth: (cm) 0.2 Epithelializati Area: (cm) 1.838 Tunneling: Volume:  (cm) 0.368 Undermining: Area: Volume: on: None No No Wound Description Full Thickness Without Foul Odor Afte Classification: Exposed Support Structures Due to Product Diabetic Severity Grade 1 (Wagner): Wound Margin: Flat and Intact Exudate Amount: Medium Exudate Type: Serous Exudate Color: amber [name], [name] ([id number]) r Cleansing: Yes Use: No Wound Bed Granulation Amount: None Present (0%) Exposed Structure Necrotic Amount: Large (67-100%) Fascia Exposed: No Necrotic Quality: Eschar, Adherent Slough Fat Layer Exposed: No Tendon Exposed: No Muscle Exposed: No Joint Exposed: No Bone Exposed: No Limited to Skin Breakdown Periwound Skin Texture Texture Color No Abnormalities Noted: No No Abnormalities Noted: No Callus: No Atrophie Blanche: No Crepitus: No Cyanosis: No Excoriation: No Ecchymosis: No Fluctuance: No Erythema: Yes Friable: No Erythema Location: Circumferential Induration: No Hemosiderin Staining: No Localized Edema: No Mottled: No Rash: No Pallor: No Scarring: No Rubor: No Moisture Temperature / Pain No Abnormalities Noted: No Temperature: No Abnormality Dry / Scaly: No Tenderness on Palpation: Yes Maceration: Yes Moist: Yes Wound Preparation Ulcer Cleansing: Rinsed/Irrigated with Saline Topical Anesthetic Applied: Other: lidocaine 4%, Treatment Notes Wound #10 (Right Calcaneus) 1. Cleansed with: Clean wound with Normal Saline 2. Anesthetic Topical Lidocaine 4% cream to wound bed prior to debridement 3. Peri-wound Care: Skin Prep 4. Dressing Applied: Santyl Ointment 5. Secondary Dressing Applied ABD Pad Dry Gauze 7. Secured with BIANNA, HARAN ([id number]) Paper tape Electronic Signature(s) Signed: 12/17/2015 12:39:46 PM By: Curtis Sites Entered By: Curtis Sites on 12/17/2015 08:57:46 Moon, Budde Vinetta Bergamo ([id number]) -------------------------------------------------------------------------------- Wound  Assessment Details Patient Name: [name] Date of Service: 12/17/2015 8:00 AM Medical Record Patient Account Number: [id number] Number: Treating RN: Myrakle, Wingler 11/05/1933 (82 [name] Other Clinician: Date of Birth/Sex: Female) Treating ROBSON, MICHAEL Primary Care Physician: [name] Physician/Extender: Linus Mako, Referring Physician: Marlou Sa in Treatment: 0 Wound Status Wound Number: 11 Primary Arterial Insufficiency Ulcer Etiology: Wound Location: Abdomen - Lower Quadrant - Midline Wound Open Status: Wounding Event: Trauma Comorbid Arrhythmia, Congestive Heart Failure, Date Acquired: 08/12/2015 History: Hypertension, Type II Diabetes Weeks Of Treatment: 0 Clustered Wound: No Photos Wound Measurements Length: (cm) 5.6 % Reduction in A Width: (cm) 9 % Reduction in V Depth: (cm) 2 Epithelializatio Area: (cm) 39.584 Tunneling: Volume: (cm) 79.168 Undermining: rea: olume: n: None No No Wound Description Full Thickness Without Exposed Foul Odor After Classification: Support Structures Due to Product Wound Margin: Flat and Intact Exudate Large Amount: Exudate Type: Purulent Exudate  Color: yellow, brown, green Cleansing: Yes Use: No Wound Bed [name], Natalie Y. ([id number]) Granulation Amount: None Present (0%) Exposed Structure Necrotic Amount: Large (67-100%) Fascia Exposed: No Necrotic Quality: Eschar, Adherent Slough Fat Layer Exposed: No Tendon Exposed: No Muscle Exposed: No Joint Exposed: No Bone Exposed: No Limited to Skin Breakdown Periwound Skin Texture Texture Color No Abnormalities Noted: No No Abnormalities Noted: No Callus: No Atrophie Blanche: No Crepitus: No Cyanosis: No Excoriation: No Ecchymosis: No Fluctuance: No Erythema: Yes Friable: No Erythema Location: Circumferential Induration: No Hemosiderin Staining: No Localized Edema: No Mottled: No Rash: No Pallor: No Scarring: No Rubor:  No Moisture Temperature / Pain No Abnormalities Noted: No Temperature: No Abnormality Dry / Scaly: No Tenderness on Palpation: Yes Maceration: No Moist: Yes Wound Preparation Ulcer Cleansing: Rinsed/Irrigated with Saline Topical Anesthetic Applied: Other: lidocaine 4%, Treatment Notes Wound #11 (Midline Abdomen - Lower Quadrant) 1. Cleansed with: Clean wound with Normal Saline 2. Anesthetic Topical Lidocaine 4% cream to wound bed prior to debridement 3. Peri-wound Care: Skin Prep 4. Dressing Applied: Santyl Ointment 5. Secondary Dressing Applied ABD Pad Dry Gauze 7. Secured with Paper tape [name], Layali Y. ([id number]) Electronic Signature(s) Signed: 12/17/2015 12:39:46 PM By: Curtis Sitesorthy, Joanna Entered By: Curtis Sitesorthy, Joanna on 12/17/2015 09:08:54 [name], Tyshia Y. ([id number]) -------------------------------------------------------------------------------- Wound Assessment Details Patient Name: [name], Natalie Y. Date of Service: 12/17/2015 8:00 AM Medical Record Patient Account Number: [id number] Number: Treating RN: Curtis SitesDorthy, Joanna 12/24/1933 (82 [name] 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 - Lower Quadrant Wound Open Status: Wounding Event: Trauma Comorbid Arrhythmia, Congestive Heart Failure, Date Acquired: 06/10/2015 History: Hypertension, Type II Diabetes Weeks Of Treatment: 0 Clustered Wound: No Photos Wound Measurements Length: (cm) 2.1 % Reduction in Ar Width: (cm) 10.7 % Reduction in Vo Depth: (cm) 2.2 Epithelialization Area: (cm) 17.648 Tunneling: Volume: (cm) 38.825 Undermining: ea: lume: : None No No Wound Description Full Thickness Without Exposed Foul Odor  After Classification: Support Structures Due to Product U Wound Margin: Flat and Intact Exudate Large Amount: Exudate Type: Purulent Exudate Color: yellow, brown, green Cleansing: Yes se: No Wound Bed [name], Daneya Y. ([id number]) Granulation Amount: Small (1-33%) Exposed Structure Granulation Quality: Red Fascia Exposed: No Necrotic Amount: Large (67-100%) Fat Layer Exposed: No Necrotic Quality: Eschar, Adherent Slough Tendon Exposed: No Muscle Exposed: No Joint Exposed: No Bone Exposed: No Limited to Skin Breakdown Periwound Skin Texture Texture Color No Abnormalities Noted: No No Abnormalities Noted: No Callus: No Atrophie Blanche: No Crepitus: No Cyanosis: No Excoriation: No Ecchymosis: No Fluctuance: No Erythema: Yes Friable: No Erythema Location: Circumferential Induration: No Hemosiderin Staining: No Localized Edema: No Mottled: No Rash: No Pallor: No Scarring: No Rubor: No Moisture Temperature / Pain No Abnormalities Noted: No Temperature: No Abnormality Dry / Scaly: No Tenderness on Palpation: Yes Maceration: No Moist: Yes Wound Preparation Ulcer Cleansing: Rinsed/Irrigated with Saline Topical Anesthetic Applied: Other: lidocaine 4%, Treatment Notes Wound #12 (Left Abdomen - Lower Quadrant) 1. Cleansed with: Clean wound with Normal Saline 2. Anesthetic Topical Lidocaine 4% cream to wound bed prior to debridement 3. Peri-wound Care: Skin Prep 4. Dressing Applied: Santyl Ointment 5. Secondary Dressing Applied ABD Pad Dry Gauze 7. Secured with Paper tape [name], Tanna Y. ([id number]) Electronic Signature(s) Signed: 12/17/2015 12:39:46 PM By: Curtis Sitesorthy, Joanna Entered By: Curtis Sitesorthy, Joanna on 12/17/2015 09:14:50 [name], Reed Y. ([id number]) --------------------------------------------------------------------------------  Wound Assessment Details Patient Name: [name], [name]. Date of Service: 12/17/2015 8:00 AM Medical Record Patient  Account Number: [id number] Number: Treating RN: Stormi, Vandevelde 07/31/1933 (82 [name] Other Clinician: Date of Birth/Sex: Female) Treating ROBSON, MICHAEL Primary Care Physician: [name] Physician/Extender: Linus Mako, Referring Physician: Marlou Sa in Treatment: 0 Wound Status Wound Number: 8 Primary Arterial Insufficiency Ulcer Etiology: Wound Location: Right Lower Leg - Lateral Wound Open Wounding Event: Gradually Appeared Status: Date Acquired: 06/02/2015 Comorbid Arrhythmia, Congestive Heart Failure, Weeks Of Treatment: 0 History: Hypertension, Type II Diabetes Clustered Wound: No Pending Amputation On Presentation Photos Wound Measurements Length: (cm) 4 % Reduction in Width: (cm) 3.3 % Reduction in Depth: (cm) 0.2 Epithelializati Area: (cm) 10.367 Tunneling: Volume: (cm) 2.073 Undermining: Area: 0% Volume: 0% on: None No No Wound Description Full Thickness Without Foul Odor Afte Classification: Exposed Support Structures Due to Product Diabetic Severity Grade 1 (Wagner): Wound Margin: Flat and Intact Exudate Amount: Medium Exudate Type: Serous Exudate Color: amber [name], [name] ([id number]) r Cleansing: Yes Use: No Wound Bed Granulation Amount: Medium (34-66%) Exposed Structure Granulation Quality: Red, Pink Fascia Exposed: No Necrotic Amount: Medium (34-66%) Fat Layer Exposed: No Necrotic Quality: Eschar, Adherent Slough Tendon Exposed: No Muscle Exposed: No Joint Exposed: No Bone Exposed: No Limited to Skin Breakdown Periwound Skin Texture Texture Color No Abnormalities Noted: No No Abnormalities Noted: No Callus: No Atrophie Blanche: No Crepitus: No Cyanosis: No Excoriation: No Ecchymosis: No Fluctuance: No Erythema: Yes Friable: No Erythema Location: Circumferential Induration: No Hemosiderin Staining: No Localized Edema: No Mottled: No Rash: No Pallor: No Scarring: No Rubor: No Moisture  Temperature / Pain No Abnormalities Noted: No Temperature: No Abnormality Dry / Scaly: No Maceration: Yes Moist: Yes Wound Preparation Ulcer Cleansing: Rinsed/Irrigated with Saline Topical Anesthetic Applied: Other: lidocaine 4%, Treatment Notes Wound #8 (Right, Lateral Lower Leg) 1. Cleansed with: Clean wound with Normal Saline 2. Anesthetic Topical Lidocaine 4% cream to wound bed prior to debridement 3. Peri-wound Care: Skin Prep 4. Dressing Applied: Santyl Ointment 5. Secondary Dressing Applied ABD Pad Dry Gauze 7. Secured with MICAIAH, REMILLARD ([id number]) Paper tape Electronic Signature(s) Signed: 12/17/2015 12:39:46 PM By: Curtis Sites Entered By: Curtis Sites on 12/17/2015 08:49:56 [name] ([id number]) -------------------------------------------------------------------------------- Wound Assessment Details Patient Name: [name] Date of Service: 12/17/2015 8:00 AM Medical Record Patient Account Number: [id number] Number: Treating RN: Tajana, Crotteau 07/20/1933 (82 [name] Other Clinician: Date of Birth/Sex: Female) Treating ROBSON, MICHAEL Primary Care Physician: [name] Physician/Extender: Linus Mako, Referring Physician: Marlou Sa in Treatment: 0 Wound Status Wound Number: 9 Primary Arterial Insufficiency Ulcer Etiology: Wound Location: Right Achilles Wound Open Wounding Event: Gradually Appeared Status: Date Acquired: 06/02/2015 Comorbid Arrhythmia, Congestive Heart Failure, Weeks Of Treatment: 0 History: Hypertension, Type II Diabetes Clustered Wound: No Pending Amputation On Presentation Photos Wound Measurements Length: (cm) 8.3 Width: (cm) 5 Depth: (cm) 0.3 Area: (cm) 32.594 Volume: (cm) 9.778 % Reduction in Area: % Reduction in Volume: Epithelialization: None Tunneling: No Undermining: No Wound Description Full Thickness Without Classification: Exposed Support Structures Diabetic  Severity Grade 1 (Wagner): Wound Margin: Flat and Intact Foul Odor After Cleansing: Yes Due to Product Use: No Wound Bed Granulation Amount: Large (67-100%) Exposed Structure Granulation Quality: Red Fascia Exposed: No JASLIN, NOVITSKI ([id number]) Necrotic Amount: Small (1-33%) Fat Layer Exposed: No Necrotic Quality: Eschar, Adherent Slough Tendon Exposed: No Muscle Exposed: No Joint Exposed: No Bone Exposed: No Limited to Skin Breakdown Periwound Skin Texture Texture Color No Abnormalities  Noted: No No Abnormalities Noted: No Callus: No Atrophie Blanche: No Crepitus: No Cyanosis: No Excoriation: No Ecchymosis: No Fluctuance: No Erythema: Yes Friable: No Erythema Location: Circumferential Induration: No Hemosiderin Staining: No Localized Edema: No Mottled: No Rash: No Pallor: No Scarring: No Rubor: No Moisture Temperature / Pain No Abnormalities Noted: No Tenderness on Palpation: Yes Dry / Scaly: No Maceration: Yes Moist: Yes Wound Preparation Ulcer Cleansing: Rinsed/Irrigated with Saline Topical Anesthetic Applied: Other: lidocaine 4%, Treatment Notes Wound #9 (Right Achilles) 1. Cleansed with: Clean wound with Normal Saline 2. Anesthetic Topical Lidocaine 4% cream to wound bed prior to debridement 3. Peri-wound Care: Skin Prep 4. Dressing Applied: Saline moistened guaze 5. Secondary Dressing Applied ABD Pad 7. Secured with Secretary/administrator) Signed: 12/17/2015 12:39:46 PM By: Neldon Labella ([id number]) Entered By: Curtis Sites on 12/17/2015 08:56:24 [name] ([id number]) -------------------------------------------------------------------------------- Vitals Details Patient Name: CRYSTALINA, STODGHILL. Date of Service: 12/17/2015 8:00 AM Medical Record Patient Account Number: [id number] Number: Treating RN: Kathyjo, Briere Dec 08, 1933 (82 [name] Other Clinician: Date of Birth/Sex: Female) Treating  ROBSON, MICHAEL Primary Care Physician: [name] Physician/Extender: Linus Mako, Referring Physician: Marlou Sa in Treatment: 0 Vital Signs Time Taken: 08:24 Pulse (bpm): 100 Height (in): 65 Respiratory Rate (breaths/min): 18 Source: Stated Blood Pressure (mmHg): 132/63 Weight (lbs): 160 Reference Range: 80 - 120 mg / dl Source: Stated Body Mass Index (BMI): 26.6 Electronic Signature(s) Signed: 12/17/2015 12:39:46 PM By: Curtis Sites Entered By: Curtis Sites on 12/17/2015 08:25:07

## 2015-12-20 LAB — AEROBIC CULTURE W GRAM STAIN (SUPERFICIAL SPECIMEN)

## 2015-12-20 LAB — AEROBIC CULTURE  (SUPERFICIAL SPECIMEN)

## 2015-12-24 ENCOUNTER — Encounter: Payer: Medicare Other | Admitting: Internal Medicine

## 2015-12-24 DIAGNOSIS — E11621 Type 2 diabetes mellitus with foot ulcer: Secondary | ICD-10-CM | POA: Diagnosis not present

## 2015-12-25 NOTE — Progress Notes (Signed)
RUSSIA, SCHEIDERER ([id number]) Visit Report for 12/24/2015 Arrival Information Details Patient Name: [name], [name]. Date of Service: 12/24/2015 12:45 PM Medical Record Patient Account Number: [id number] Number: Treating RN: Jurnei, Latini 1933-09-12 (82 y.o. Other Clinician: Date of Birth/Sex: Female) Treating ROBSON, MICHAEL Primary Care Physician: [name] Physician/Extender: G Referring Physician: Charolotte Capuchin in Treatment: 1 Visit Information History Since Last Visit Added or deleted any medications: No Patient Arrived: Wheel Chair Any new allergies or adverse reactions: No Arrival Time: 12:52 Had a fall or experienced change in No Accompanied By: dtr activities of daily living that may affect Transfer Assistance: Manual risk of falls: Patient Identification Verified: Yes Signs or symptoms of abuse/neglect since last No Secondary Verification Process Yes visito Completed: Hospitalized since last visit: No Patient Has Alerts: Yes Pain Present Now: No Patient Alerts: Patient on Blood Thinner DMII Warfarin ABI Greasy Bilateral NO BP RIGHT ARM Electronic Signature(s) Signed: 12/24/2015 3:56:52 PM By: Curtis Sites Entered By: Curtis Sites on 12/24/2015 12:57:53 [name] ([id number]) -------------------------------------------------------------------------------- Encounter Discharge Information Details Patient Name: [name]. Date of Service: 12/24/2015 12:45 PM Medical Record Patient Account Number: [id number] Number: Treating RN: Casimira, Sutphin [birth year] (82 y.o. Other Clinician: Date of Birth/Sex: Female) Treating ROBSON, MICHAEL Primary Care Physician: [name] Physician/Extender: G Referring Physician: Charolotte Capuchin in Treatment: 1 Encounter Discharge Information Items Schedule Follow-up Appointment: No Medication Reconciliation completed No and provided to Patient/Care Fayetta Sorenson: Provided  on Clinical Summary of Care: 12/24/2015 Form Type Recipient Paper Patient [facility] Electronic Signature(s) Signed: 12/24/2015 2:09:41 PM By: Gwenlyn Perking Entered By: Gwenlyn Perking on 12/24/2015 14:09:41 [name] ([id number]) -------------------------------------------------------------------------------- Lower Extremity Assessment Details Patient Name: [name] Date of Service: 12/24/2015 12:45 PM Medical Record Patient Account Number: [id number] Number: Treating RN: Elania, Crowl 1933-10-02 (82 y.o. Other Clinician: Date of Birth/Sex: Female) Treating ROBSON, MICHAEL Primary Care Physician: [name] Physician/Extender: G Referring Physician: Charolotte Capuchin in Treatment: 1 Edema Assessment Assessed: [Left: No] [Right: No] Edema: [Left: N] [Right: o] Vascular Assessment Pulses: Posterior Tibial Palpable: [Right:Yes] Dorsalis Pedis Palpable: [Right:Yes] Extremity colors, hair growth, and conditions: Extremity Color: [Right:Hyperpigmented] Hair Growth on Extremity: [Right:No] Temperature of Extremity: [Right:Warm] Capillary Refill: [Right:< 3 seconds] Electronic Signature(s) Signed: 12/24/2015 3:56:52 PM By: Curtis Sites Entered By: Curtis Sites on 12/24/2015 13:07:20 [name] ([id number]) -------------------------------------------------------------------------------- Multi Wound Chart Details Patient Name: [name] Date of Service: 12/24/2015 12:45 PM Medical Record Patient Account Number: [id number] Number: Treating RN: Myrtice, Lowdermilk [birth year] (82 y.o. 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 Comorbid History: Arrhythmia, Congestive Arrhythmia, Congestive Arrhythmia, Congestive Heart Failure, Heart Failure, Heart Failure, Hypertension, Type II Hypertension, Type II Hypertension, Type II Diabetes Diabetes Diabetes Date Acquired: 06/02/2015 08/12/2015 06/10/2015 Weeks of Treatment: 1 1 1  Wound Status: Open Open Open Pending Amputation on Yes No No Presentation: Measurements L x W x D 1.5x1.8x0.1 4.5x9.4x2 1.5x10.1x1.9 (cm) Area (cm) : 2.121 33.222 11.899 Volume (cm) : 0.212 66.445 22.608 % Reduction in Area: -15.40% 16.10% 32.60% % Reduction in Volume: 42.40% 16.10% 41.80%  Classification: JESSYCA, SLOAN ([id number]) Full Thickness Without Full Thickness Without Full Thickness Without Exposed Support Exposed Support Exposed Support Structures Structures Structures HBO Classification: Grade 1 N/A N/A Exudate Amount: Medium Large Large Exudate Type: Serous Serous Serous Exudate Color: amber Media planner Foul Odor After Yes Yes Yes Cleansing: Odor Anticipated Due to No No No Product Use: Wound Margin: Flat and Intact Flat and Intact Flat and Intact Granulation Amount: None Present (0%) Large (67-100%) Large (67-100%) Granulation Quality: N/A Red Red Necrotic Amount: Large (67-100%) Small (1-33%) Small (1-33%) Necrotic Tissue: Eschar, Adherent Slough Eschar, Adherent Slough Eschar, Adherent Slough Exposed Structures: Fascia: No Fascia: No Fascia: No Fat: No Fat: No Fat: No Tendon: No Tendon: No Tendon: No Muscle: No Muscle: No Muscle: No Joint: No Joint: No Joint: No Bone: No Bone: No Bone: No Limited to Skin Limited to Skin Limited to Skin Breakdown Breakdown  Breakdown Epithelialization: None None None Debridement: N/A Debridement ([id number]/A [id number]) Time-Out Taken: N/A Yes N/A Pain Control: N/A Lidocaine 4% Topical N/A Solution Tissue Debrided: N/A Necrotic/Eschar, N/A Fibrin/Slough Level: N/A Skin/Subcutaneous N/A Tissue Debridement Area (sq N/A 42.3 N/A cm): Instrument: N/A Blade, Forceps N/A Bleeding: N/A Minimum N/A Hemostasis Achieved: N/A Pressure N/A Procedural Pain: N/A 0 N/A Post Procedural Pain: N/A 0 N/A Debridement Treatment N/A Procedure was tolerated N/A Response: well Post Debridement N/A 4.5x9.4x2 N/A Measurements L x W x D (cm) Post Debridement N/A 66.445 N/A Volume: (cm) Periwound Skin Texture: ALEXYIA, GUARINO ([id number]) Edema: No Edema: No Edema: No Excoriation: No Excoriation: No Excoriation: No Induration: No Induration: No Induration: No Callus: No Callus: No Callus: No Crepitus: No Crepitus: No Crepitus: No Fluctuance: No Fluctuance: No Fluctuance: No Friable: No Friable: No Friable: No Rash: No Rash: No Rash: No Scarring: No Scarring: No Scarring: No Periwound Skin Maceration: Yes Moist: Yes Moist: Yes Moisture: Moist: Yes Maceration: No Maceration: No Dry/Scaly: No Dry/Scaly: No Dry/Scaly: No Periwound Skin Color: Erythema: Yes Erythema: Yes Erythema: Yes Atrophie Blanche: No Atrophie Blanche: No Atrophie Blanche: No Cyanosis: No Cyanosis: No Cyanosis: No Ecchymosis: No Ecchymosis: No Ecchymosis: No Hemosiderin Staining: No Hemosiderin Staining: No Hemosiderin Staining: No Mottled: No Mottled: No Mottled: No Pallor: No Pallor: No Pallor: No Rubor: No Rubor: No Rubor: No Erythema Location: Circumferential Circumferential Circumferential Temperature: No Abnormality No Abnormality No Abnormality Tenderness on Yes Yes Yes Palpation: Wound Preparation: Ulcer Cleansing: Ulcer Cleansing: Ulcer Cleansing: Rinsed/Irrigated with Rinsed/Irrigated with  Rinsed/Irrigated with Saline Saline Saline Topical Anesthetic Topical Anesthetic Topical Anesthetic Applied: Other: lidocaine Applied: Other: lidocaine Applied: Other: lidocaine 4% 4% 4% Procedures Performed: N/A Debridement N/A Wound Number: [id number] N/A Photos: N/A Wound Location: Right Lower Leg - Lateral Right Achilles N/A Wounding Event: Gradually Appeared Gradually Appeared N/A 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 N/A Comorbid History: Arrhythmia, Congestive Arrhythmia, Congestive N/A Heart Failure, Heart Failure, Hypertension, Type II Hypertension, Type II Diabetes Diabetes Date Acquired: 06/02/2015 06/02/2015 N/A Weeks of Treatment: 1 1 N/A DODY, SMARTT ([id number]) Wound Status: Open Open N/A Pending Amputation on Yes Yes N/A Presentation: Measurements L x W x D 2x2.9x0.1 8.5x4.2x0.3 N/A (cm) Area (cm) : 4.555 28.039 N/A Volume (cm) : 0.456 8.412 N/A % Reduction in Area: 56.10% 14.00% N/A % Reduction in Volume: 78.00% 14.00% N/A Classification: Full Thickness Without Full Thickness Without N/A Exposed Support Exposed Support Structures Structures HBO Classification: Grade 1 Grade 1 N/A Exudate Amount: Medium N/A N/A Exudate Type: Serous N/A N/A Exudate Color: amber N/A N/A Foul Odor  After Yes Yes N/A Cleansing: Odor Anticipated Due to No No N/A Product Use: Wound Margin: Flat and Intact Flat and Intact N/A Granulation Amount: Medium (34-66%) Medium (34-66%) N/A Granulation Quality: Red, Pink Red N/A Necrotic Amount: Medium (34-66%) Medium (34-66%) N/A Necrotic Tissue: Eschar, Adherent Slough Eschar, Adherent Slough N/A Exposed Structures: Fascia: No Fascia: No N/A Fat: No Fat: No Tendon: No Tendon: No Muscle: No Muscle: No Joint: No Joint: No Bone: No Bone: No Limited to Skin Limited to Skin Breakdown Breakdown Epithelialization: None None N/A Debridement: N/A Debridement ([id number]/A [id number]) Time-Out Taken: N/A  Yes N/A Pain Control: N/A Lidocaine 4% Topical N/A Solution Tissue Debrided: N/A Necrotic/Eschar, N/A Fibrin/Slough Level: N/A Skin/Subcutaneous N/A Tissue Debridement Area (sq N/A 35.7 N/A cm): Instrument: N/A Blade, Forceps N/A Bleeding: N/A Minimum N/A Hemostasis Achieved: N/A Pressure N/A Procedural Pain: N/A 0 N/A DELLIA, RUNCK ([id number]) Post Procedural Pain: N/A 0 N/A Debridement Treatment N/A Procedure was tolerated N/A Response: well Post Debridement N/A 8.5x4.2x0.3 N/A Measurements L x W x D (cm) Post Debridement N/A 8.412 N/A Volume: (cm) Periwound Skin Texture: Edema: No Edema: No N/A Excoriation: No Excoriation: No Induration: No Induration: No Callus: No Callus: No Crepitus: No Crepitus: No Fluctuance: No Fluctuance: No Friable: No Friable: No Rash: No Rash: No Scarring: No Scarring: No Periwound Skin Maceration: Yes Maceration: Yes N/A Moisture: Moist: Yes Moist: Yes Dry/Scaly: No Dry/Scaly: No Periwound Skin Color: Erythema: Yes Erythema: Yes N/A Atrophie Blanche: No Atrophie Blanche: No Cyanosis: No Cyanosis: No Ecchymosis: No Ecchymosis: No Hemosiderin Staining: No Hemosiderin Staining: No Mottled: No Mottled: No Pallor: No Pallor: No Rubor: No Rubor: No Erythema Location: Circumferential Circumferential N/A Temperature: No Abnormality N/A N/A Tenderness on No Yes N/A Palpation: Wound Preparation: Ulcer Cleansing: Ulcer Cleansing: N/A Rinsed/Irrigated with Rinsed/Irrigated with Saline Saline Topical Anesthetic Topical Anesthetic Applied: Other: lidocaine Applied: Other: lidocaine 4% 4% Procedures Performed: N/A Debridement N/A Treatment Notes Electronic Signature(s) Signed: 12/24/2015 3:56:52 PM By: Curtis Sites Entered By: Curtis Sites on 12/24/2015 13:42:24 [name] ([id number]) -------------------------------------------------------------------------------- Multi-Disciplinary Care Plan  Details Patient Name: [name] Date of Service: 12/24/2015 12:45 PM Medical Record Patient Account Number: [id number] Number: Treating RN: Reshunda, Urrego 06/28/1933 (82 y.o. Other Clinician: Date of Birth/Sex: Female) Treating ROBSON, MICHAEL Primary Care Physician: [name] Physician/Extender: G Referring Physician: Charolotte Capuchin in Treatment: 1 Active Inactive Abuse / Safety / Falls / Self Care Management Nursing Diagnoses: Impaired physical mobility Potential for falls Goals: Patient will remain injury free Date Initiated: 12/17/2015 Goal Status: Active Interventions: Assess fall risk on admission and as needed Notes: Wound/Skin Impairment Nursing Diagnoses: Impaired tissue integrity Goals: Patient/caregiver will verbalize understanding of skin care regimen Date Initiated: 12/17/2015 Goal Status: Active Ulcer/skin breakdown will have a volume reduction of 30% by week 4 Date Initiated: 12/17/2015 Goal Status: Active Ulcer/skin breakdown will have a volume reduction of 50% by week 8 Date Initiated: 12/17/2015 Goal Status: Active Ulcer/skin breakdown will have a volume reduction of 80% by week 12 Date Initiated: 12/17/2015 Goal Status: Active Ulcer/skin breakdown will heal within 14 weeks TEANNA, CARLI ([id number]) Date Initiated: 12/17/2015 Goal Status: Active Interventions: Assess patient/caregiver ability to obtain necessary supplies Assess patient/caregiver ability to perform ulcer/skin care regimen upon admission and as needed Assess ulceration(s) every visit Notes: Electronic Signature(s) Signed: 12/24/2015 3:56:52 PM By: Curtis Sites Entered By: Curtis Sites on 12/24/2015 13:42:16 [name] ([id number]) -------------------------------------------------------------------------------- Pain Assessment Details Patient Name: [name] Date of Service: 12/24/2015 12:45  PM Medical Record Patient Account Number:  [id number] Number: Treating RN: Neyra, Pettie 09/22/1933 (82 y.o. Other Clinician: Date of Birth/Sex: Female) Treating ROBSON, MICHAEL Primary Care Physician: [name] Physician/Extender: G Referring Physician: Charolotte Capuchin in Treatment: 1 Active Problems Location of Pain Severity and Description of Pain Patient Has Paino No Site Locations Pain Management and Medication Current Pain Management: Notes Topical or injectable lidocaine is offered to patient for acute pain when surgical debridement is performed. If needed, Patient is instructed to use over the counter pain medication for the following 24-48 hours after debridement. Wound care MDs do not prescribed pain medications. Patient has chronic pain or uncontrolled pain. Patient has been instructed to make an appointment with their Primary Care Physician for pain management. Electronic Signature(s) Signed: 12/24/2015 3:56:52 PM By: Curtis Sites Entered By: Curtis Sites on 12/24/2015 12:58:07 [name] ([id number]) -------------------------------------------------------------------------------- Wound Assessment Details Patient Name: [name] Date of Service: 12/24/2015 12:45 PM Medical Record Patient Account Number: [id number] Number: Treating RN: Mckinzie, Saksa June 10, 1933 (82 y.o. Other Clinician: Date of Birth/Sex: Female) Treating ROBSON, MICHAEL Primary Care Physician: [name] Physician/Extender: G Referring Physician: Charolotte Capuchin in Treatment: 1 Wound Status Wound Number: 10 Primary Arterial Insufficiency Ulcer Etiology: Wound Location: Right Calcaneus Wound Open Wounding Event: Gradually Appeared Status: Date Acquired: 06/02/2015 Comorbid Arrhythmia, Congestive Heart Failure, Weeks Of Treatment: 1 History: Hypertension, Type II Diabetes Clustered Wound: No Pending Amputation On Presentation Photos Wound Measurements Length: (cm) 1.5 %  Reduction in Width: (cm) 1.8 % Reduction in Depth: (cm) 0.1 Epithelializati Area: (cm) 2.121 Tunneling: Volume: (cm) 0.212 Undermining: Area: -15.4% Volume: 42.4% on: None No No Wound Description Full Thickness Without Foul Odor Afte Classification: Exposed Support Structures Due to Product Diabetic Severity Grade 1 (Wagner): Wound Margin: Flat and Intact Exudate Amount: Medium Exudate Type: Serous Exudate Color: amber r Cleansing: Yes Use: No Wound Bed JESENYA, BOWDITCH ([id number]) Granulation Amount: None Present (0%) Exposed Structure Necrotic Amount: Large (67-100%) Fascia Exposed: No Necrotic Quality: Eschar, Adherent Slough Fat Layer Exposed: No Tendon Exposed: No Muscle Exposed: No Joint Exposed: No Bone Exposed: No Limited to Skin Breakdown Periwound Skin Texture Texture Color No Abnormalities Noted: No No Abnormalities Noted: No Callus: No Atrophie Blanche: No Crepitus: No Cyanosis: No Excoriation: No Ecchymosis: No Fluctuance: No Erythema: Yes Friable: No Erythema Location: Circumferential Induration: No Hemosiderin Staining: No Localized Edema: No Mottled: No Rash: No Pallor: No Scarring: No Rubor: No Moisture Temperature / Pain No Abnormalities Noted: No Temperature: No Abnormality Dry / Scaly: No Tenderness on Palpation: Yes Maceration: Yes Moist: Yes Wound Preparation Ulcer Cleansing: Rinsed/Irrigated with Saline Topical Anesthetic Applied: Other: lidocaine 4%, Electronic Signature(s) Signed: 12/24/2015 3:56:52 PM By: Curtis Sites Entered By: Curtis Sites on 12/24/2015 13:13:27 [name] ([id number]) -------------------------------------------------------------------------------- Wound Assessment Details Patient Name: [name] Date of Service: 12/24/2015 12:45 PM Medical Record Patient Account Number: [id number] Number: Treating RN: Korrina, Zern [birth year] (82 y.o. Other Clinician: Date of  Birth/Sex: Female) Treating ROBSON, MICHAEL Primary Care Physician: [name] Physician/Extender: G Referring Physician: Charolotte Capuchin in Treatment: 1 Wound Status Wound Number: 11 Primary Arterial Insufficiency Ulcer Etiology: Wound Location: Abdomen - Lower Quadrant - Midline Wound Open Status: Wounding Event: Trauma Comorbid Arrhythmia, Congestive Heart Failure, Date Acquired: 08/12/2015 History: Hypertension, Type II Diabetes Weeks Of Treatment: 1 Clustered Wound: No Photos Wound Measurements Length: (cm) 4.5 % Reduction in A Width: (cm) 9.4 % Reduction in V Depth: (cm) 2 Epithelializatio Area: (  cm) 33.222 Tunneling: Volume: (cm) 66.445 Undermining: rea: 16.1% olume: 16.1% n: None No No Wound Description Full Thickness Without Exposed Foul Odor After Classification: Support Structures Due to Product Wound Margin: Flat and Intact Exudate Large Amount: Exudate Type: Serous Exudate Color: amber Cleansing: Yes Use: No Wound Bed Granulation Amount: Large (67-100%) Exposed Structure ZEYA, BALLES ([id number]) Granulation Quality: Red Fascia Exposed: No Necrotic Amount: Small (1-33%) Fat Layer Exposed: No Necrotic Quality: Eschar, Adherent Slough Tendon Exposed: No Muscle Exposed: No Joint Exposed: No Bone Exposed: No Limited to Skin Breakdown Periwound Skin Texture Texture Color No Abnormalities Noted: No No Abnormalities Noted: No Callus: No Atrophie Blanche: No Crepitus: No Cyanosis: No Excoriation: No Ecchymosis: No Fluctuance: No Erythema: Yes Friable: No Erythema Location: Circumferential Induration: No Hemosiderin Staining: No Localized Edema: No Mottled: No Rash: No Pallor: No Scarring: No Rubor: No Moisture Temperature / Pain No Abnormalities Noted: No Temperature: No Abnormality Dry / Scaly: No Tenderness on Palpation: Yes Maceration: No Moist: Yes Wound Preparation Ulcer Cleansing: Rinsed/Irrigated with  Saline Topical Anesthetic Applied: Other: lidocaine 4%, Electronic Signature(s) Signed: 12/24/2015 3:56:52 PM By: Curtis Sites Entered By: Curtis Sites on 12/24/2015 13:27:01 [name] ([id number]) -------------------------------------------------------------------------------- Wound Assessment Details Patient Name: [name] Date of Service: 12/24/2015 12:45 PM Medical Record Patient Account Number: [id number] Number: Treating RN: Gussie, Murton November 25, 1933 (82 y.o. Other Clinician: Date of Birth/Sex: Female) Treating ROBSON, MICHAEL Primary Care Physician: [name] Physician/Extender: G Referring Physician: Charolotte Capuchin in Treatment: 1 Wound Status Wound Number: 12 Primary Arterial Insufficiency Ulcer Etiology: Wound Location: Left Abdomen - Lower Quadrant Wound Open Status: Wounding Event: Trauma Comorbid Arrhythmia, Congestive Heart Failure, Date Acquired: 06/10/2015 History: Hypertension, Type II Diabetes Weeks Of Treatment: 1 Clustered Wound: No Photos Wound Measurements Length: (cm) 1.5 % Reduction in Ar Width: (cm) 10.1 % Reduction in Vo Depth: (cm) 1.9 Epithelialization Area: (cm) 11.899 Tunneling: Volume: (cm) 22.608 Undermining: ea: 32.6% lume: 41.8% : None No No Wound Description Full Thickness Without Exposed Foul Odor After Classification: Support Structures Due to Product U Wound Margin: Flat and Intact Exudate Large Amount: Exudate Type: Serous Exudate Color: amber Cleansing: Yes se: No Wound Bed Granulation Amount: Large (67-100%) Exposed Structure SUKARI, GRIST ([id number]) Granulation Quality: Red Fascia Exposed: No Necrotic Amount: Small (1-33%) Fat Layer Exposed: No Necrotic Quality: Eschar, Adherent Slough Tendon Exposed: No Muscle Exposed: No Joint Exposed: No Bone Exposed: No Limited to Skin Breakdown Periwound Skin Texture Texture Color No Abnormalities Noted: No No  Abnormalities Noted: No Callus: No Atrophie Blanche: No Crepitus: No Cyanosis: No Excoriation: No Ecchymosis: No Fluctuance: No Erythema: Yes Friable: No Erythema Location: Circumferential Induration: No Hemosiderin Staining: No Localized Edema: No Mottled: No Rash: No Pallor: No Scarring: No Rubor: No Moisture Temperature / Pain No Abnormalities Noted: No Temperature: No Abnormality Dry / Scaly: No Tenderness on Palpation: Yes Maceration: No Moist: Yes Wound Preparation Ulcer Cleansing: Rinsed/Irrigated with Saline Topical Anesthetic Applied: Other: lidocaine 4%, Electronic Signature(s) Signed: 12/24/2015 3:56:52 PM By: Curtis Sites Entered By: Curtis Sites on 12/24/2015 13:27:30 [name] ([id number]) -------------------------------------------------------------------------------- Wound Assessment Details Patient Name: [name] Date of Service: 12/24/2015 12:45 PM Medical Record Patient Account Number: [id number] Number: Treating RN: Masen, Salvas 05/16/1934 (82 y.o. Other Clinician: Date of Birth/Sex: Female) Treating ROBSON, MICHAEL Primary Care Physician: [name] Physician/Extender: G Referring Physician: Charolotte Capuchin in Treatment: 1 Wound Status Wound Number: 8 Primary Arterial Insufficiency Ulcer Etiology: Wound Location: Right Lower Leg -  Lateral Wound Open Wounding Event: Gradually Appeared Status: Date Acquired: 06/02/2015 Comorbid Arrhythmia, Congestive Heart Failure, Weeks Of Treatment: 1 History: Hypertension, Type II Diabetes Clustered Wound: No Pending Amputation On Presentation Photos Wound Measurements Length: (cm) 2 % Reduction in Width: (cm) 2.9 % Reduction in Depth: (cm) 0.1 Epithelializati Area: (cm) 4.555 Tunneling: Volume: (cm) 0.456 Undermining: Area: 56.1% Volume: 78% on: None No No Wound Description Full Thickness Without Foul Odor Afte Classification: Exposed Support  Structures Due to Product Diabetic Severity Grade 1 (Wagner): Wound Margin: Flat and Intact Exudate Amount: Medium Exudate Type: Serous Exudate Color: amber r Cleansing: Yes Use: No Wound Bed MERIE, WULF ([id number]) Granulation Amount: Medium (34-66%) Exposed Structure Granulation Quality: Red, Pink Fascia Exposed: No Necrotic Amount: Medium (34-66%) Fat Layer Exposed: No Necrotic Quality: Eschar, Adherent Slough Tendon Exposed: No Muscle Exposed: No Joint Exposed: No Bone Exposed: No Limited to Skin Breakdown Periwound Skin Texture Texture Color No Abnormalities Noted: No No Abnormalities Noted: No Callus: No Atrophie Blanche: No Crepitus: No Cyanosis: No Excoriation: No Ecchymosis: No Fluctuance: No Erythema: Yes Friable: No Erythema Location: Circumferential Induration: No Hemosiderin Staining: No Localized Edema: No Mottled: No Rash: No Pallor: No Scarring: No Rubor: No Moisture Temperature / Pain No Abnormalities Noted: No Temperature: No Abnormality Dry / Scaly: No Maceration: Yes Moist: Yes Wound Preparation Ulcer Cleansing: Rinsed/Irrigated with Saline Topical Anesthetic Applied: Other: lidocaine 4%, Electronic Signature(s) Signed: 12/24/2015 3:56:52 PM By: Curtis Sites Entered By: Curtis Sites on 12/24/2015 13:12:17 [name] ([id number]) -------------------------------------------------------------------------------- Wound Assessment Details Patient Name: [name] Date of Service: 12/24/2015 12:45 PM Medical Record Patient Account Number: [id number] Number: Treating RN: Ethne, Jeon [birth year] (82 y.o. Other Clinician: Date of Birth/Sex: Female) Treating ROBSON, MICHAEL Primary Care Physician: [name] Physician/Extender: G Referring Physician: Charolotte Capuchin in Treatment: 1 Wound Status Wound Number: [id number] Primary Arterial Insufficiency Ulcer Etiology: Wound Location: Right  Achilles Wound Open Wounding Event: Gradually Appeared Status: Date Acquired: 06/02/2015 Comorbid Arrhythmia, Congestive Heart Failure, Weeks Of Treatment: 1 History: Hypertension, Type II Diabetes Clustered Wound: No Pending Amputation On Presentation Photos Wound Measurements Length: (cm) 8.5 Width: (cm) 4.2 Depth: (cm) 0.3 Area: (cm) 28.039 Volume: (cm) 8.412 % Reduction in Area: 14% % Reduction in Volume: 14% Epithelialization: None Tunneling: No Undermining: No Wound Description Full Thickness Without Classification: Exposed Support Structures Diabetic Severity Grade 1 (Wagner): Wound Margin: Flat and Intact Foul Odor After Cleansing: Yes Due to Product Use: No Wound Bed Granulation Amount: Medium (34-66%) Exposed Structure Granulation Quality: Red Fascia Exposed: No Necrotic Amount: Medium (34-66%) Fat Layer Exposed: No NAJA, APPERSON ([id number]) Necrotic Quality: Eschar, Adherent Slough Tendon Exposed: No Muscle Exposed: No Joint Exposed: No Bone Exposed: No Limited to Skin Breakdown Periwound Skin Texture Texture Color No Abnormalities Noted: No No Abnormalities Noted: No Callus: No Atrophie Blanche: No Crepitus: No Cyanosis: No Excoriation: No Ecchymosis: No Fluctuance: No Erythema: Yes Friable: No Erythema Location: Circumferential Induration: No Hemosiderin Staining: No Localized Edema: No Mottled: No Rash: No Pallor: No Scarring: No Rubor: No Moisture Temperature / Pain No Abnormalities Noted: No Tenderness on Palpation: Yes Dry / Scaly: No Maceration: Yes Moist: Yes Wound Preparation Ulcer Cleansing: Rinsed/Irrigated with Saline Topical Anesthetic Applied: Other: lidocaine 4%, Electronic Signature(s) Signed: 12/24/2015 3:56:52 PM By: Curtis Sites Entered By: Curtis Sites on 12/24/2015 13:13:00 [name] ([id number]) -------------------------------------------------------------------------------- Vitals  Details Patient Name: [name] Date of Service: 12/24/2015 12:45 PM Medical Record Patient Account Number: [id number] Number: Treating  RN: Avina, Eberle 1933/08/30 (82 y.o. Other Clinician: Date of Birth/Sex: Female) Treating ROBSON, MICHAEL Primary Care Physician: [name] Physician/Extender: G Referring Physician: Charolotte Capuchin in Treatment: 1 Vital Signs Time Taken: 12:58 Temperature (F): 98.0 Height (in): 65 Pulse (bpm): 72 Weight (lbs): 160 Respiratory Rate (breaths/min): 18 Body Mass Index (BMI): 26.6 Blood Pressure (mmHg): 111/54 Reference Range: 80 - 120 mg / dl Electronic Signature(s) Signed: 12/24/2015 3:56:52 PM By: Curtis Sites Entered By: Curtis Sites on 12/24/2015 13:00:59

## 2015-12-25 NOTE — Progress Notes (Signed)
TABITA, CORBO ([id number]) Visit Report for 12/24/2015 Chief Complaint Document Details Patient Name: [name], [name]. Date of Service: 12/24/2015 12:45 PM Medical Record Patient Account Number: [id number] Number: Treating RN: Seren, Chaloux Apr 25, 1934 (82 y.o. Other Clinician: Date of Birth/Sex: Female) Treating ROBSON, MICHAEL Primary Care Physician/Extender: Rex Kras, Heidi Physician: Referring Physician: Charolotte Capuchin in Treatment: 1 Information Obtained from: Patient Chief Complaint Patients presents for treatment of an open diabetic ulcer, arterial ulcer and pressure ulcers to the right lower extremities which is a complex etiology for the last several months. He also has ulcers on her abdomen in the left lower quadrant and left suprapubic area which she's had for about 4 months. 12/17/15; patient returns to clinic apparently referred back from vascular surgery for ongoing wound care. Electronic Signature(s) Signed: 12/25/2015 7:54:01 AM By: Baltazar Najjar MD Entered By: Baltazar Najjar on 12/24/2015 13:48:02 [name] ([id number]) -------------------------------------------------------------------------------- Debridement Details Patient Name: [name] Date of Service: 12/24/2015 12:45 PM Medical Record Patient Account Number: [id number] Number: Treating RN: Veora, Fonte 05-02-1934 (82 y.o. Other Clinician: Date of Birth/Sex: Female) Treating ROBSON, MICHAEL Primary Care Physician/Extender: Rex Kras, Heidi Physician: Referring Physician: Charolotte Capuchin in Treatment: 1 Debridement Performed for Wound #11 Midline Abdomen - Lower Quadrant Assessment: Performed By: Physician Maxwell Caul, MD Debridement: Debridement Pre-procedure Yes Verification/Time Out Taken: Start Time: 13:34 Pain Control: Lidocaine 4% Topical Solution Level: Skin/Subcutaneous Tissue Total Area Debrided (L x 4.5 (cm) x 9.4 (cm) = 42.3  (cm) W): Tissue and other Viable, Non-Viable, Eschar, Fibrin/Slough material debrided: Instrument: Blade, Forceps Bleeding: Minimum Hemostasis Achieved: Pressure End Time: 13:37 Procedural Pain: 0 Post Procedural Pain: 0 Response to Treatment: Procedure was tolerated well Post Debridement Measurements of Total Wound Length: (cm) 4.5 Width: (cm) 9.4 Depth: (cm) 2 Volume: (cm) 66.445 Post Procedure Diagnosis Same as Pre-procedure Electronic Signature(s) Signed: 12/24/2015 3:56:52 PM By: Curtis Sites Signed: 12/25/2015 7:54:01 AM By: Baltazar Najjar MD Entered By: Baltazar Najjar on 12/24/2015 13:46:02 Meko, Bellanger Vinetta Bergamo ([id number]) SHAMIKA, PEDREGON ([id number]) -------------------------------------------------------------------------------- Debridement Details Patient Name: [name]. Date of Service: 12/24/2015 12:45 PM Medical Record Patient Account Number: [id number] Number: Treating RN: Justus, Duerr 10/19/1933 (82 y.o. Other Clinician: Date of Birth/Sex: Female) Treating ROBSON, MICHAEL Primary Care Physician/Extender: Rex Kras, Heidi Physician: Referring Physician: Charolotte Capuchin in Treatment: 1 Debridement Performed for Wound #9 Right Achilles Assessment: Performed By: Physician Maxwell Caul, MD Debridement: Debridement Pre-procedure Yes Verification/Time Out Taken: Start Time: 13:37 Pain Control: Lidocaine 4% Topical Solution Level: Skin/Subcutaneous Tissue Total Area Debrided (L x 8.5 (cm) x 4.2 (cm) = 35.7 (cm) W): Tissue and other Viable, Non-Viable, Eschar, Fibrin/Slough material debrided: Instrument: Blade, Forceps Bleeding: Minimum Hemostasis Achieved: Pressure End Time: 13:40 Procedural Pain: 0 Post Procedural Pain: 0 Response to Treatment: Procedure was tolerated well Post Debridement Measurements of Total Wound Length: (cm) 8.5 Width: (cm) 4.2 Depth: (cm) 0.3 Volume: (cm) 8.412 Post Procedure  Diagnosis Same as Pre-procedure Electronic Signature(s) Signed: 12/24/2015 3:56:52 PM By: Curtis Sites Signed: 12/25/2015 7:54:01 AM By: Baltazar Najjar MD Entered By: Baltazar Najjar on 12/24/2015 13:46:54 ANAHIS, FURGESON ([id number]) SEANNE, CHIRICO ([id number]) -------------------------------------------------------------------------------- HPI Details Patient Name: [name], [name]. Date of Service: 12/24/2015 12:45 PM Medical Record Patient Account Number: [id number] Number: Treating RN: Foye, Haggart Aug 06, 1933 (82 y.o. Other Clinician: Date of Birth/Sex: Female) Treating ROBSON, MICHAEL Primary Care Physician/Extender: Rex Kras, Heidi Physician: Referring Physician: Charolotte Capuchin in Treatment: 1 History of Present Illness  Location: several wounds on the right lower extremity including her right healed right posterior ankle and right lower third of the leg. She also has wounds on her left lower quadrant of the abdomen and the suprapubic area. Quality: Patient reports experiencing a sharp pain to affected area(s). Severity: Patient states wound are getting worse. Duration: Patient has had the wound for > 3 months prior to seeking treatment at the wound center Timing: Pain in wound is Intermittent (comes and goes Context: The wound appeared gradually over time Modifying Factors: Other treatment(s) tried include:he simply been admitted to the hospital 2 weeks ago and has had procedures done on her right lower extremity and had a blockage which we are trying to get some notes Associated Signs and Symptoms: Patient reports having increase discharge. HPI Description: 82 year old patient who is known to be diabetic, was referred to [place] by Dr. Gavin Potters for a right heel ulceration which she's had for a while. She was recently in hospital for a pneumonia and at that time and got delirious and was disoriented and sometime during this time developed a stage II ulcer  on her right heel. Her past medical history is significant for bilateral pneumonia which was treated with injectable antibiotics and then to oral Levaquin which he has completed. She also has acute on chronic diastolic CHF, acute on chronic respiratory failure, end-stage renal disease on hemodialysis, atrial fibrillation, recent stroke, diabetes mellitus. The patient and her son are poor historians but from what I understand she was admitted to the hospital with an acute vascular compromise of her right lower extremity and Dr. Wyn Quaker has done a surgical procedure and we are trying to obtain these notes. There are also some vascular workup done and we will try and obtain these notes. the injury to the left lower quadrant of abdomen and the suprapubic area have been there due to a bruise and have been there for several months and no intervention has been done. 10/11/2015 -- on review of the electronics records it was noted that the patient was admitted to the hospital on 09/14/2015 with peripheral vascular disease with claudication, end-stage renal disease, pressure ulcer, chronic atrial fibrillation. She was seen by Dr. Wyn Quaker who did her right lower extremity angiogram , angioplasty of the right anterior tibial artery and thrombolysis with TPA of the right popliteal artery, and thrombectomy. She was seen by Dr. Wyn Quaker during this past week and he was pleased with the progress. He did say that if he took her to the operating room for any procedure he would debride the abdominal wound under anesthesia. She was also seen by Dr. Ether Griffins the podiatrist who thought that she may lose her right fourth toe at some stage may need an amputation of this. ADLAI, NIEBLAS ([id number]) 10/21/2015 --patient known to Dr. Wyn Quaker and his last office visit from 10/04/2015 has been reviewed. 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 She also had a persistent ulceration of the right heel and markedly this area and a right third and fourth toe and a small scab on the calf but these are dry and seemed to be improving. Patient also has a left carotid endarterectomy and multiple interventions to a right brachiocephalic AV fistula. After the visit he had recommended noninvasive studies to recheck her revascularization. He was off the impression that she would likely lose the right fourth toe and the third toe  was likely to heal. He was concerned about underlying muscle necrosis on her right heel and midfoot. 11/01/2015 -- an echo done in January of this year showed her left ventricular ejection fraction to be about 50-55%. The patient was seen by the PA and Dr. Driscilla Grammes office and the plan was to take her to the operating room soon to have a debridement under anesthesia for the abdominal wall wound, the Achilles tendon on the right leg and amputation of the right fourth toe. The daughter and the patient do not feel that they would be able to undergo hyperbaric oxygen therapy 5 days a week for 6 weeks. 12/17/15; this is a medically complex woman who I note was recently in this clinic however I was not involved with her care. She returns today with multiple wounds; a) she has a wound in the mid abdomen that is been there since March of this year. I note that she is been to the overall for debridement recently. The exact etiology of this wound is not really clear b) left lower quadrant abdominal wound had some sanguinous drainage when she came in here. The patient fell in January and thinks this may have been secondary to a hematoma. c): The patient has 3 wounds on her right leg including a small wound on the right mid calf, a large area over the Achilles which currently has a wound VAC for the last 6 weeks, also a smaller wound on the distal part of the right heel. As far as I understand most of these wounds  are currently been dressed with's calcium alginate. According to her daughter the Achilles wound under the wound VAC is doing well d) the patient is had an amputation of her left fifth toe in January and the right fourth toe 6 weeks ago secondary to diabetic PAD e) the patient has chronic renal failure on dialysis for the last 2 years secondary to type 2 diabetes on insulin. The daughter's knowledge there is been no biopsy of the abdominal wounds given their current appearance and lack of undefined etiology at have to wonder about calciphylaxis. 12/18/15:Addendum; I [facility] I can see no relevant x-rays of the right heel. I note her arteriogram and revascularization of her right lower extremity in April 2017. She had debridement of both abdominal wounds and the right heel and Achilles wound on 11/07/15 at which time she had a right fourth toe ray amputation. The abdominal wounds were debridement again on 6/29. I do not see any relevant pathology of these abdominal wounds 12/24/15; culture I did of the drainage from the midline abdominal wound last week showed both Proteus and ampicillin sensitive enterococcus. I've given her a course of Augmentin adjusted on dialysis days. She has no specific complaints today. Been using Santyl to the abdominal wounds in the right leg wound and the wound VAC on the right Achilles which was initially prescribed by Dr. Wyn Quaker Electronic Signature(s) Signed: 12/25/2015 7:54:01 AM By: Baltazar Najjar MD Entered By: Baltazar Najjar on 12/24/2015 13:53:04 [name] ([id number]) -------------------------------------------------------------------------------- Physical Exam Details Patient Name: [name] Date of Service: 12/24/2015 12:45 PM Medical Record Patient Account Number: [id number] Number: Treating RN: Geneviene, Dashner 1933/08/24 (82 y.o. Other Clinician: Date of Birth/Sex: Female) Treating ROBSON, MICHAEL Primary Care  Physician/Extender: Rex Kras, Heidi Physician: Referring Physician: Charolotte Capuchin in Treatment: 1 Constitutional Sitting or standing Blood Pressure is within target range for patient.. Pulse regular and within target range for patient.Marland Kitchen Respirations  regular, non-labored and within target range.. Temperature is normal and within the target range for the patient.. Notes Wound exam; firstly on the abdomen lower midline this is a much cleaner-looking wound. Surgical debridement of necrotic subcutaneous tissue in the superior rim tolerated well. There is much less purulent drainage. Small wound on the right anterior leg also looks to be healthy. The Achilles area as healthy granulation. Both inferiorly and superiorly on this there is adherent surface eschar. I had wondered whether some of this might be tendon however in any case this is clearly not opposed and couldn't be removed Electronic Signature(s) Signed: 12/25/2015 7:54:01 AM By: Baltazar Najjar MD Entered By: Baltazar Najjar on 12/24/2015 13:54:52 [name] ([id number]) -------------------------------------------------------------------------------- Physician Orders Details Patient Name: [name]. Date of Service: 12/24/2015 12:45 PM Medical Record Patient Account Number: [id number] Number: Treating RN: Niamya, Vittitow Jun 05, 1933 (82 y.o. Other Clinician: Date of Birth/Sex: Female) Treating ROBSON, MICHAEL Primary Care Physician/Extender: Rex Kras, Heidi Physician: Referring Physician: Charolotte Capuchin in Treatment: 1 Verbal / Phone Orders: Yes Clinician: Curtis Sites Read Back and Verified: Yes Diagnosis Coding Wound Cleansing Wound #10 Right Calcaneus o Clean wound with Normal Saline. Wound #11 Midline Abdomen - Lower Quadrant o Clean wound with Normal Saline. Wound #12 Left Abdomen - Lower Quadrant o Clean wound with Normal Saline. Wound #8 Right,Lateral Lower Leg o Clean  wound with Normal Saline. Wound #9 Right Achilles o Clean wound with Normal Saline. Anesthetic Wound #10 Right Calcaneus o Topical Lidocaine 4% cream applied to wound bed prior to debridement - in Wound Clinic Wound #11 Midline Abdomen - Lower Quadrant o Topical Lidocaine 4% cream applied to wound bed prior to debridement - in Wound Clinic Wound #12 Left Abdomen - Lower Quadrant o Topical Lidocaine 4% cream applied to wound bed prior to debridement - in Wound Clinic Wound #8 Right,Lateral Lower Leg o Topical Lidocaine 4% cream applied to wound bed prior to debridement - in Wound Clinic Wound #9 Right Achilles o Topical Lidocaine 4% cream applied to wound bed prior to debridement - in Wound Clinic Skin Barriers/Peri-Wound Care Wound #10 Right Calcaneus JASMINE, MCBETH ([id number]) o Skin Prep Wound #11 Midline Abdomen - Lower Quadrant o Skin Prep Wound #12 Left Abdomen - Lower Quadrant o Skin Prep Wound #8 Right,Lateral Lower Leg o Skin Prep Wound #9 Right Achilles o Skin Prep Primary Wound Dressing Wound #10 Right Calcaneus o Santyl Ointment Wound #11 Midline Abdomen - Lower Quadrant o Santyl Ointment Wound #12 Left Abdomen - Lower Quadrant o Santyl Ointment Wound #8 Right,Lateral Lower Leg o Santyl Ointment Wound #9 Right Achilles o Santyl Ointment - under NPWT foam o Saline moistened gauze - in wound clinic only Secondary Dressing Wound #10 Right Calcaneus o ABD pad o Dry Gauze - secure with tape Wound #11 Midline Abdomen - Lower Quadrant o ABD pad o Dry Gauze - secure with tape Wound #12 Left Abdomen - Lower Quadrant o ABD pad o Dry Gauze - secure with tape Wound #8 Right,Lateral Lower Leg o ABD pad o Dry Gauze - secure with tape Wound #9 Right Achilles JAME, SEELIG ([id number]) o ABD pad - in wound clinic only o Dry Gauze - in wound clinic only Dressing Change Frequency Wound #10 Right  Calcaneus o Change Dressing Monday, Wednesday, Friday Wound #11 Midline Abdomen - Lower Quadrant o Change Dressing Monday, Wednesday, Friday Wound #12 Left Abdomen - Lower Quadrant o Change Dressing Monday, Wednesday, Friday Wound #8 Right,Lateral Lower  Leg o Change Dressing Monday, Wednesday, Friday Wound #9 Right Achilles o Change Dressing Monday, Wednesday, Friday Follow-up Appointments Wound #10 Right Calcaneus o Return Appointment in 1 week. Wound #11 Midline Abdomen - Lower Quadrant o Return Appointment in 1 week. Wound #12 Left Abdomen - Lower Quadrant o Return Appointment in 1 week. Wound #8 Right,Lateral Lower Leg o Return Appointment in 1 week. Wound #9 Right Achilles o Return Appointment in 1 week. Home Health Wound #10 Right Calcaneus o Continue Home Health Visits - [facility] to order appropriate wound care supplies for patient o Home Health Nurse may visit [facility] to address patientos wound care needs. o FACE TO FACE ENCOUNTER: MEDICARE and MEDICAID PATIENTS: I certify that this patient is under my care and that I had a face-to-face encounter that meets the physician face-to-face encounter requirements with this patient on this date. The encounter with the patient was in whole or in part for the following MEDICAL CONDITION: (primary reason for Home Healthcare) MEDICAL NECESSITY: I certify, that based on my findings, NURSING services are a medically necessary home health service. HOME BOUND STATUS: I certify that my clinical findings support that this patient is homebound (i.e., Due to illness or injury, pt requires aid of supportive devices such as crutches, cane, wheelchairs, walkers, the use of special transportation or the assistance of another person to leave their place of residence. There is a NATIA, FAHMY ([id number]) normal inability to leave the home and doing so requires considerable and taxing effort. Other absences are for medical  reasons / religious services and are infrequent or of short duration when for other reasons). o If current dressing causes regression in wound condition, may D/C ordered dressing product/s and apply Normal Saline Moist Dressing daily until next Wound Healing Center / Other MD appointment. Notify Wound Healing Center of regression in wound condition at [phone]. o Please direct any NON-WOUND related issues/requests for orders to patient's Primary Care Physician Wound #11 Midline Abdomen - Lower Quadrant o Continue Home Health Visits - [facility] to order appropriate wound care supplies for patient o Home Health Nurse may visit PRN to address patientos wound care needs. o FACE TO FACE ENCOUNTER: MEDICARE and MEDICAID PATIENTS: I certify that this patient is under my care and that I had a face-to-face encounter that meets the physician face-to-face encounter requirements with this patient on this date. The encounter with the patient was in whole or in part for the following MEDICAL CONDITION: (primary reason for Home Healthcare) MEDICAL NECESSITY: I certify, that based on my findings, NURSING services are a medically necessary home health service. HOME BOUND STATUS: I certify that my clinical findings support that this patient is homebound (i.e., Due to illness or injury, pt requires aid of supportive devices such as crutches, cane, wheelchairs, walkers, the use of special transportation or the assistance of another person to leave their place of residence. There is a normal inability to leave the home and doing so requires considerable and taxing effort. Other absences are for medical reasons / religious services and are infrequent or of short duration when for other reasons). o If current dressing causes regression in wound condition, may D/C ordered dressing product/s and apply Normal Saline Moist Dressing daily until next Wound Healing Center / Other MD appointment. Notify Wound  Healing Center of regression in wound condition at [phone]. o Please direct any NON-WOUND related issues/requests for orders to patient's Primary Care Physician Wound #12 Left Abdomen - Lower Quadrant o Continue Home Health  Visits - [facility] to order appropriate wound care supplies for patient o Home Health Nurse may visit [facility]N to address patientos wound care needs. o FACE TO FACE ENCOUNTER: MEDICARE and MEDICAID PATIENTS: I certify that this patient is under my care and that I had a face-to-face encounter that meets the physician face-to-face encounter requirements with this patient on this date. The encounter with the patient was in whole or in part for the following MEDICAL CONDITION: (primary reason for Home Healthcare) MEDICAL NECESSITY: I certify, that based on my findings, NURSING services are a medically necessary home health service. HOME BOUND STATUS: I certify that my clinical findings support that this patient is homebound (i.e., Due to illness or injury, pt requires aid of supportive devices such as crutches, cane, wheelchairs, walkers, the use of special transportation or the assistance of another person to leave their place of residence. There is a normal inability to leave the home and doing so requires considerable and taxing effort. Other absences are for medical reasons / religious services and are infrequent or of short duration when for other reasons). o If current dressing causes regression in wound condition, may D/C ordered dressing product/s and apply Normal Saline Moist Dressing daily until next Wound Healing Center / Other MD appointment. Notify Wound Healing Center of regression in wound condition at [phone]. o Please direct any NON-WOUND related issues/requests for orders to patient's Primary Care Physician LENETTE, RAU ([id number]) Wound #8 Right,Lateral Lower Leg o Continue [facility] Health Visits - [facility] to order appropriate wound care  supplies for patient o Home Health Nurse may visit PRN to address patientos wound care needs. o FACE TO FACE ENCOUNTER: MEDICARE and MEDICAID PATIENTS: I certify that this patient is under my care and that I had a face-to-face encounter that meets the physician face-to-face encounter requirements with this patient on this date. The encounter with the patient was in whole or in part for the following MEDICAL CONDITION: (primary reason for Home Healthcare) MEDICAL NECESSITY: I certify, that based on my findings, NURSING services are a medically necessary home health service. HOME BOUND STATUS: I certify that my clinical findings support that this patient is homebound (i.e., Due to illness or injury, pt requires aid of supportive devices such as crutches, cane, wheelchairs, walkers, the use of special transportation or the assistance of another person to leave their place of residence. There is a normal inability to leave the home and doing so requires considerable and taxing effort. Other absences are for medical reasons / religious services and are infrequent or of short duration when for other reasons). o If current dressing causes regression in wound condition, may D/C ordered dressing product/s and apply Normal Saline Moist Dressing daily until next Wound Healing Center / Other MD appointment. Notify Wound Healing Center of regression in wound condition at [phone]. o Please direct any NON-WOUND related issues/requests for orders to patient's Primary Care Physician Wound #9 Right Achilles o Continue Home Health Visits - [facility] to order appropriate wound care supplies for patient o Home Health Nurse may visit PRN to address patientos wound care needs. o FACE TO FACE ENCOUNTER: MEDICARE and MEDICAID PATIENTS: I certify that this patient is under my care and that I had a face-to-face encounter that meets the physician face-to-face encounter requirements with this patient on  this date. The encounter with the patient was in whole or in part for the following MEDICAL CONDITION: (primary reason for Home Healthcare) MEDICAL NECESSITY: I certify, that based on my  findings, NURSING services are a medically necessary home health service. HOME BOUND STATUS: I certify that my clinical findings support that this patient is homebound (i.e., Due to illness or injury, pt requires aid of supportive devices such as crutches, cane, wheelchairs, walkers, the use of special transportation or the assistance of another person to leave their place of residence. There is a normal inability to leave the home and doing so requires considerable and taxing effort. Other absences are for medical reasons / religious services and are infrequent or of short duration when for other reasons). o If current dressing causes regression in wound condition, may D/C ordered dressing product/s and apply Normal Saline Moist Dressing daily until next Wound Healing Center / Other MD appointment. Notify Wound Healing Center of regression in wound condition at [phone]. o Please direct any NON-WOUND related issues/requests for orders to patient's Primary Care Physician Negative Pressure Wound Therapy Wound #9 Right Achilles o Wound VAC settings at 125/130 mmHg continuous pressure. Use BLACK/GREEN foam to wound cavity. Use WHITE foam to fill any tunnel/s and/or undermining. Change VAC dressing 3 X WEEK. Change canister as indicated when full. Nurse may titrate settings and frequency of dressing changes as clinically indicated. MICHAELLA, IMAI ([id number]) Electronic Signature(s) Signed: 12/24/2015 3:56:52 PM By: Curtis Sites Signed: 12/25/2015 7:54:01 AM By: Baltazar Najjar MD Entered By: Curtis Sites on 12/24/2015 13:44:03 Taccara, Bushnell Vinetta Bergamo ([id number]) -------------------------------------------------------------------------------- Problem List Details Patient Name: PAYETON, GERMANI. Date  of Service: 12/24/2015 12:45 PM Medical Record Patient Account Number: [id number] Number: Treating RN: Tayen, Narang 1933-07-17 (82 y.o. Other Clinician: Date of Birth/Sex: Female) Treating ROBSON, MICHAEL Primary Care Physician/Extender: Rex Kras, Heidi Physician: Referring Physician: Charolotte Capuchin in Treatment: 1 Active Problems ICD-10 Encounter Code Description Active Date Diagnosis E11.621 Type 2 diabetes mellitus with foot ulcer 12/17/2015 Yes S31.104A Unspecified open wound of abdominal wall, left lower 12/17/2015 Yes quadrant without penetration into peritoneal cavity, initial encounter E11.51 Type 2 diabetes mellitus with diabetic peripheral 12/17/2015 Yes angiopathy without gangrene Inactive Problems Resolved Problems Electronic Signature(s) Signed: 12/25/2015 7:54:01 AM By: Baltazar Najjar MD Entered By: Baltazar Najjar on 12/24/2015 13:45:29 [name] ([id number]) -------------------------------------------------------------------------------- Progress Note Details Patient Name: [name] Date of Service: 12/24/2015 12:45 PM Medical Record Patient Account Number: [id number] Number: Treating RN: Laiyla, Slagel 19-Jul-1933 (82 y.o. Other Clinician: Date of Birth/Sex: Female) Treating ROBSON, MICHAEL Primary Care Physician/Extender: Rex Kras, Heidi Physician: Referring Physician: Charolotte Capuchin in Treatment: 1 Subjective Chief Complaint Information obtained from Patient Patients presents for treatment of an open diabetic ulcer, arterial ulcer and pressure ulcers to the right lower extremities which is a complex etiology for the last several months. He also has ulcers on her abdomen in the left lower quadrant and left suprapubic area which she's had for about 4 months. 12/17/15; patient returns to clinic apparently referred back from vascular surgery for ongoing wound care. History of Present Illness (HPI) The  following HPI elements were documented for the patient's wound: Location: several wounds on the right lower extremity including her right healed right posterior ankle and right lower third of the leg. She also has wounds on her left lower quadrant of the abdomen and the suprapubic area. Quality: Patient reports experiencing a sharp pain to affected area(s). Severity: Patient states wound are getting worse. Duration: Patient has had the wound for > 3 months prior to seeking treatment at the wound center Timing: Pain in wound is Intermittent (comes and goes Context: The wound  appeared gradually over time Modifying Factors: Other treatment(s) tried include:he simply been admitted to the hospital 2 weeks ago and has had procedures done on her right lower extremity and had a blockage which we are trying to get some notes Associated Signs and Symptoms: Patient reports having increase discharge. 82 year old patient who is known to be diabetic, was referred to [place] by Dr. Gavin Potters for a right heel ulceration which she's had for a while. She was recently in hospital for a pneumonia and at that time and got delirious and was disoriented and sometime during this time developed a stage II ulcer on her right heel. Her past medical history is significant for bilateral pneumonia which was treated with injectable antibiotics and then to oral Levaquin which he has completed. She also has acute on chronic diastolic CHF, acute on chronic respiratory failure, end-stage renal disease on hemodialysis, atrial fibrillation, recent stroke, diabetes mellitus. The patient and her son are poor historians but from what I understand she was admitted to the hospital with an acute vascular compromise of her right lower extremity and Dr. Wyn Quaker has done a surgical procedure and we are trying to obtain these notes. There are also some vascular workup done and we will try and obtain these notes. the injury to the left lower quadrant  of abdomen and the suprapubic area have been there due to a bruise and have been there for several months and no intervention has been done. SAMONE, GUHL ([id number]) 10/11/2015 -- on review of the electronics records it was noted that the patient was admitted to the hospital on 09/14/2015 with peripheral vascular disease with claudication, end-stage renal disease, pressure ulcer, chronic atrial fibrillation. She was seen by Dr. Wyn Quaker who did her right lower extremity angiogram , angioplasty of the right anterior tibial artery and thrombolysis with TPA of the right popliteal artery, and thrombectomy. She was seen by Dr. Wyn Quaker during this past week and he was pleased with the progress. He did say that if he took her to the operating room for any procedure he would debride the abdominal wound under anesthesia. She was also seen by Dr. Ether Griffins the podiatrist who thought that she may lose her right fourth toe at some stage may need an amputation of this. 10/21/2015 --patient known to Dr. Wyn Quaker and his last office visit from 10/04/2015 has been reviewed. 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 She also had a persistent ulceration of the right heel and markedly this area and a right third and fourth toe and a small scab on the calf but these are dry and seemed to be improving. Patient also has a left carotid endarterectomy and multiple interventions to a right brachiocephalic AV fistula. After the visit he had recommended noninvasive studies to recheck her revascularization. He was off the impression that she would likely lose the right fourth toe and the third toe was likely to heal. He was concerned about underlying muscle necrosis on her right heel and midfoot. 11/01/2015 -- an echo done in January of this year showed her left ventricular ejection fraction to be  about 50-55%. The patient was seen by the PA and Dr. Driscilla Grammes office and the plan was to take her to the operating room soon to have a debridement under anesthesia for the abdominal wall wound, the Achilles tendon on the right leg and amputation of the right fourth toe. The daughter  and the patient do not feel that they would be able to undergo hyperbaric oxygen therapy 5 days a week for 6 weeks. 12/17/15; this is a medically complex woman who I note was recently in this clinic however I was not involved with her care. She returns today with multiple wounds; a) she has a wound in the mid abdomen that is been there since March of this year. I note that she is been to the overall for debridement recently. The exact etiology of this wound is not really clear b) left lower quadrant abdominal wound had some sanguinous drainage when she came in here. The patient fell in January and thinks this may have been secondary to a hematoma. c): The patient has 3 wounds on her right leg including a small wound on the right mid calf, a large area over the Achilles which currently has a wound VAC for the last 6 weeks, also a smaller wound on the distal part of the right heel. As far as I understand most of these wounds are currently been dressed with's calcium alginate. According to her daughter the Achilles wound under the wound VAC is doing well d) the patient is had an amputation of her left fifth toe in January and the right fourth toe 6 weeks ago secondary to diabetic PAD e) the patient has chronic renal failure on dialysis for the last 2 years secondary to type 2 diabetes on insulin. The daughter's knowledge there is been no biopsy of the abdominal wounds given their current appearance and lack of undefined etiology at have to wonder about calciphylaxis. 12/18/15:Addendum; I [facility] I can see no relevant x-rays of the right heel. I note her arteriogram and revascularization of her right  lower extremity in April 2017. She had debridement of both abdominal wounds and the right heel and Achilles wound on 11/07/15 at which time she had a right fourth toe ray amputation. The abdominal wounds were debridement again on 6/29. I do not see any relevant pathology of these abdominal wounds AKAIYA, TOUCHETTE ([id number]) 12/24/15; culture I did of the drainage from the midline abdominal wound last week showed both Proteus and ampicillin sensitive enterococcus. I've given her a course of Augmentin adjusted on dialysis days. She has no specific complaints today. Been using Santyl to the abdominal wounds in the right leg wound and the wound VAC on the right Achilles which was initially prescribed by Dr. dew Objective Constitutional Sitting or standing Blood Pressure is within target range for patient.. Pulse regular and within target range for patient.Marland Kitchen Respirations regular, non-labored and within target range.. Temperature is normal and within the target range for the patient.. Vitals Time Taken: 12:58 PM, Height: 65 in, Weight: 160 lbs, BMI: 26.6, Temperature: 98.0 F, Pulse: 72 bpm, Respiratory Rate: 18 breaths/min, Blood Pressure: 111/54 mmHg. General Notes: Wound exam; firstly on the abdomen lower midline this is a much cleaner-looking wound. Surgical debridement of necrotic subcutaneous tissue in the superior rim tolerated well. There is much less purulent drainage. Small wound on the right anterior leg also looks to be healthy. The Achilles area as healthy granulation. Both inferiorly and superiorly on this there is adherent surface eschar. I had wondered whether some of this might be tendon however in any case this is clearly not opposed and couldn't be removed Integumentary (Hair, Skin) Wound #10 status is Open. Original cause of wound was Gradually Appeared. The wound is located on the Right Calcaneus. The wound measures 1.5cm length  x 1.8cm width x 0.1cm depth; 2.121cm^2 area  and 0.212cm^3 volume. The wound is limited to skin breakdown. There is no tunneling or undermining noted. There is a medium amount of serous drainage noted. The wound margin is flat and intact. There is no granulation within the wound bed. There is a large (67-100%) amount of necrotic tissue within the wound bed including Eschar and Adherent Slough. The periwound skin appearance exhibited: Maceration, Moist, Erythema. The periwound skin appearance did not exhibit: Callus, Crepitus, Excoriation, Fluctuance, Friable, Induration, Localized Edema, Rash, Scarring, Dry/Scaly, Atrophie Blanche, Cyanosis, Ecchymosis, Hemosiderin Staining, Mottled, Pallor, Rubor. The surrounding wound skin color is noted with erythema which is circumferential. Periwound temperature was noted as No Abnormality. The periwound has tenderness on palpation. Wound #11 status is Open. Original cause of wound was Trauma. The wound is located on the Midline Abdomen - Lower Quadrant. The wound measures 4.5cm length x 9.4cm width x 2cm depth; 33.222cm^2 area and 66.445cm^3 volume. The wound is limited to skin breakdown. There is no tunneling or undermining noted. There is a large amount of serous drainage noted. The wound margin is flat and intact. There is large (67-100%) red granulation within the wound bed. There is a small (1-33%) amount of necrotic tissue within the wound bed including Eschar and Adherent Slough. The periwound skin appearance exhibited: Moist, Erythema. The periwound skin appearance did not exhibit: Callus, Crepitus, Excoriation, Fluctuance, Friable, Induration, Localized Edema, Rash, Scarring, Dry/Scaly, Maceration, Atrophie JAYLEENE, GLAESER ([id number]) Cyanosis, Ecchymosis, Hemosiderin Staining, Mottled, Pallor, Rubor. The surrounding wound skin color is noted with erythema which is circumferential. Periwound temperature was noted as No Abnormality. The periwound has tenderness on palpation. Wound  #12 status is Open. Original cause of wound was Trauma. The wound is located on the Left Abdomen - Lower Quadrant. The wound measures 1.5cm length x 10.1cm width x 1.9cm depth; 11.899cm^2 area and 22.608cm^3 volume. The wound is limited to skin breakdown. There is no tunneling or undermining noted. There is a large amount of serous drainage noted. The wound margin is flat and intact. There is large (67-100%) red granulation within the wound bed. There is a small (1-33%) amount of necrotic tissue within the wound bed including Eschar and Adherent Slough. The periwound skin appearance exhibited: Moist, Erythema. The periwound skin appearance did not exhibit: Callus, Crepitus, Excoriation, Fluctuance, Friable, Induration, Localized Edema, Rash, Scarring, Dry/Scaly, Maceration, Atrophie Blanche, Cyanosis, Ecchymosis, Hemosiderin Staining, Mottled, Pallor, Rubor. The surrounding wound skin color is noted with erythema which is circumferential. Periwound temperature was noted as No Abnormality. The periwound has tenderness on palpation. Wound #8 status is Open. Original cause of wound was Gradually Appeared. The wound is located on the Right,Lateral Lower Leg. The wound measures 2cm length x 2.9cm width x 0.1cm depth; 4.555cm^2 area and 0.456cm^3 volume. The wound is limited to skin breakdown. There is no tunneling or undermining noted. There is a medium amount of serous drainage noted. The wound margin is flat and intact. There is medium (34-66%) red, pink granulation within the wound bed. There is a medium (34-66%) amount of necrotic tissue within the wound bed including Eschar and Adherent Slough. The periwound skin appearance exhibited: Maceration, Moist, Erythema. The periwound skin appearance did not exhibit: Callus, Crepitus, Excoriation, Fluctuance, Friable, Induration, Localized Edema, Rash, Scarring, Dry/Scaly, Atrophie Blanche, Cyanosis, Ecchymosis, Hemosiderin Staining, Mottled, Pallor,  Rubor. The surrounding wound skin color is noted with erythema which is circumferential. Periwound temperature was noted as No Abnormality. Wound #9 status is  Open. Original cause of wound was Gradually Appeared. The wound is located on the Right Achilles. The wound measures 8.5cm length x 4.2cm width x 0.3cm depth; 28.039cm^2 area and 8.412cm^3 volume. The wound is limited to skin breakdown. There is no tunneling or undermining noted. The wound margin is flat and intact. There is medium (34-66%) red granulation within the wound bed. There is a medium (34-66%) amount of necrotic tissue within the wound bed including Eschar and Adherent Slough. The periwound skin appearance exhibited: Maceration, Moist, Erythema. The periwound skin appearance did not exhibit: Callus, Crepitus, Excoriation, Fluctuance, Friable, Induration, Localized Edema, Rash, Scarring, Dry/Scaly, Atrophie Blanche, Cyanosis, Ecchymosis, Hemosiderin Staining, Mottled, Pallor, Rubor. The surrounding wound skin color is noted with erythema which is circumferential. The periwound has tenderness on palpation. 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 There was a Skin/Subcutaneous Tissue Debridement ([id number]) debridement with total area of 42.3 sq cm performed by Maxwell Caul, MD. with the following instrument(s): Blade and Forceps to remove Viable and Non-Viable tissue/material including Fibrin/Slough and Eschar after achieving pain control using Lidocaine 4% Topical Solution. A time out was conducted prior to the  start of the procedure. A Minimum amount of bleeding was controlled with Pressure. The procedure was tolerated well with a pain level of 0 throughout and a pain level of 0 following the procedure. Post Debridement Measurements: 4.5cm length x 9.4cm width x 2cm depth; 66.445cm^3 volume. 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 There was a Skin/Subcutaneous Tissue Debridement ([id number]) debridement with total area of 35.7 sq cm performed by Maxwell Caul, MD. with the following instrument(s): Blade and Forceps to remove Viable and Non-Viable tissue/material including Fibrin/Slough and Eschar after achieving pain control using Lidocaine 4% Topical Solution. A time out was conducted prior to the start of the procedure. A Minimum amount of bleeding was controlled with Pressure. The procedure was tolerated well with a pain level of 0 throughout and a pain level of 0 following the procedure. Post Debridement Measurements: 8.5cm length x 4.2cm width x 0.3cm depth; 8.412cm^3 volume. Post procedure Diagnosis Wound #9: Same as Pre-Procedure Plan Wound Cleansing: Wound #10 Right Calcaneus: Clean wound with Normal Saline. Wound #11 Midline Abdomen - Lower Quadrant: Clean wound with Normal Saline. Wound #12 Left Abdomen - Lower Quadrant: Clean wound with Normal Saline. Wound #8 Right,Lateral Lower Leg: Clean wound with Normal Saline. Wound #9 Right Achilles: Clean wound with Normal Saline. MARYKAY, MCCLEOD ([id number]) Anesthetic: Wound #10 Right Calcaneus: Topical Lidocaine 4% cream applied to wound bed prior to debridement - in Wound Clinic Wound #11 Midline Abdomen - Lower Quadrant: Topical Lidocaine 4% cream applied to wound bed prior to debridement - in Wound Clinic Wound #12 Left Abdomen - Lower Quadrant: Topical Lidocaine 4% cream applied to wound bed prior to debridement - in Wound  Clinic Wound #8 Right,Lateral Lower Leg: Topical Lidocaine 4% cream applied to wound bed prior to debridement - in Wound Clinic Wound #9 Right Achilles: Topical Lidocaine 4% cream applied to wound bed prior to debridement - in Wound Clinic Skin  Barriers/Peri-Wound Care: Wound #10 Right Calcaneus: Skin Prep Wound #11 Midline Abdomen - Lower Quadrant: Skin Prep Wound #12 Left Abdomen - Lower Quadrant: Skin Prep Wound #8 Right,Lateral Lower Leg: Skin Prep Wound #9 Right Achilles: Skin Prep Primary Wound Dressing: Wound #10 Right Calcaneus: Santyl Ointment Wound #11 Midline Abdomen - Lower Quadrant: Santyl Ointment Wound #12 Left Abdomen - Lower Quadrant: Santyl Ointment Wound #8 Right,Lateral Lower Leg: Santyl Ointment Wound #9 Right Achilles: Santyl Ointment - under NPWT foam Saline moistened gauze - in wound clinic only Secondary Dressing: Wound #10 Right Calcaneus: ABD pad Dry Gauze - secure with tape Wound #11 Midline Abdomen - Lower Quadrant: ABD pad Dry Gauze - secure with tape Wound #12 Left Abdomen - Lower Quadrant: ABD pad Dry Gauze - secure with tape Wound #8 Right,Lateral Lower Leg: ABD pad Dry Gauze - secure with tape Wound #9 Right Achilles: ABD pad - in wound clinic only Dry Gauze - in wound clinic only Dressing Change Frequency: LORENNA, LURRY ([id number]) Wound #10 Right Calcaneus: Change Dressing Monday, Wednesday, Friday Wound #11 Midline Abdomen - Lower Quadrant: Change Dressing Monday, Wednesday, Friday Wound #12 Left Abdomen - Lower Quadrant: Change Dressing Monday, Wednesday, Friday Wound #8 Right,Lateral Lower Leg: Change Dressing Monday, Wednesday, Friday Wound #9 Right Achilles: Change Dressing Monday, Wednesday, Friday Follow-up Appointments: Wound #10 Right Calcaneus: Return Appointment in 1 week. Wound #11 Midline Abdomen - Lower Quadrant: Return Appointment in 1 week. Wound #12 Left Abdomen - Lower Quadrant: Return Appointment  in 1 week. Wound #8 Right,Lateral Lower Leg: Return Appointment in 1 week. Wound #9 Right Achilles: Return Appointment in 1 week. Home Health: Wound #10 Right Calcaneus: Continue Home Health Visits - [facility] to order appropriate wound care supplies for patient Home Health Nurse may visit PRN to address patient s wound care needs. FACE TO FACE ENCOUNTER: MEDICARE and MEDICAID PATIENTS: I certify that this patient is under my care and that I had a face-to-face encounter that meets the physician face-to-face encounter requirements with this patient on this date. The encounter with the patient was in whole or in part for the following MEDICAL CONDITION: (primary reason for Home Healthcare) MEDICAL NECESSITY: I certify, that based on my findings, NURSING services are a medically necessary home health service. HOME BOUND STATUS: I certify that my clinical findings support that this patient is homebound (i.e., Due to illness or injury, pt requires aid of supportive devices such as crutches, cane, wheelchairs, walkers, the use of special transportation or the assistance of another person to leave their place of residence. There is a normal inability to leave the home and doing so requires considerable and taxing effort. Other absences are for medical reasons / religious services and are infrequent or of short duration when for other reasons). If current dressing causes regression in wound condition, may D/C ordered dressing product/s and apply Normal Saline Moist Dressing daily until next Wound Healing Center / Other MD appointment. Notify Wound Healing Center of regression in wound condition at [phone]. Please direct any NON-WOUND related issues/requests for orders to patient's Primary Care Physician Wound #11 Midline Abdomen - Lower Quadrant: Continue Home Health Visits - [facility] to order appropriate wound care supplies for patient Home Health Nurse may visit PRN to address patient s wound care  needs. FACE TO FACE ENCOUNTER: MEDICARE and MEDICAID PATIENTS: I certify that this patient is under my care and that I had a face-to-face encounter that meets the physician face-to-face encounter requirements with this patient on this  date. The encounter with the patient was in whole or in part for the following MEDICAL CONDITION: (primary reason for Home Healthcare) MEDICAL NECESSITY: I certify, that based on my findings, NURSING services are a medically necessary home health service. HOME BOUND STATUS: I certify that my clinical findings support that this patient is homebound (i.e., Due to illness or injury, pt requires aid of supportive devices such as crutches, cane, wheelchairs, walkers, the use of special transportation or the assistance of another person to leave their place of residence. There is a normal inability to leave the home and doing so requires considerable and taxing effort. Other absences are AHLIYAH, NIENOW ([id number]) for medical reasons / religious services and are infrequent or of short duration when for other reasons). If current dressing causes regression in wound condition, may D/C ordered dressing product/s and apply Normal Saline Moist Dressing daily until next Wound Healing Center / Other MD appointment. Notify Wound Healing Center of regression in wound condition at [phone]. Please direct any NON-WOUND related issues/requests for orders to patient's Primary Care Physician Wound #12 Left Abdomen - Lower Quadrant: Continue Home Health Visits - [facility] to order appropriate wound care supplies for patient Home Health Nurse may visit PRN to address patient s wound care needs. FACE TO FACE ENCOUNTER: MEDICARE and MEDICAID PATIENTS: I certify that this patient is under my care and that I had a face-to-face encounter that meets the physician face-to-face encounter requirements with this patient on this date. The encounter with the patient was in whole or in part for  the following MEDICAL CONDITION: (primary reason for Home Healthcare) MEDICAL NECESSITY: I certify, that based on my findings, NURSING services are a medically necessary home health service. HOME BOUND STATUS: I certify that my clinical findings support that this patient is homebound (i.e., Due to illness or injury, pt requires aid of supportive devices such as crutches, cane, wheelchairs, walkers, the use of special transportation or the assistance of another person to leave their place of residence. There is a normal inability to leave the home and doing so requires considerable and taxing effort. Other absences are for medical reasons / religious services and are infrequent or of short duration when for other reasons). If current dressing causes regression in wound condition, may D/C ordered dressing product/s and apply Normal Saline Moist Dressing daily until next Wound Healing Center / Other MD appointment. Notify Wound Healing Center of regression in wound condition at [phone]. Please direct any NON-WOUND related issues/requests for orders to patient's Primary Care Physician Wound #8 Right,Lateral Lower Leg: Continue Home Health Visits - [facility] to order appropriate wound care supplies for patient Home Health Nurse may visit PRN to address patient s wound care needs. FACE TO FACE ENCOUNTER: MEDICARE and MEDICAID PATIENTS: I certify that this patient is under my care and that I had a face-to-face encounter that meets the physician face-to-face encounter requirements with this patient on this date. The encounter with the patient was in whole or in part for the following MEDICAL CONDITION: (primary reason for Home Healthcare) MEDICAL NECESSITY: I certify, that based on my findings, NURSING services are a medically necessary home health service. HOME BOUND STATUS: I certify that my clinical findings support that this patient is homebound (i.e., Due to illness or injury, pt requires aid of  supportive devices such as crutches, cane, wheelchairs, walkers, the use of special transportation or the assistance of another person to leave their place of residence. There is a normal inability to  leave the home and doing so requires considerable and taxing effort. Other absences are for medical reasons / religious services and are infrequent or of short duration when for other reasons). If current dressing causes regression in wound condition, may D/C ordered dressing product/s and apply Normal Saline Moist Dressing daily until next Wound Healing Center / Other MD appointment. Notify Wound Healing Center of regression in wound condition at [phone]. Please direct any NON-WOUND related issues/requests for orders to patient's Primary Care Physician Wound #9 Right Achilles: Continue Home Health Visits - [facility] to order appropriate wound care supplies for patient Home Health Nurse may visit PRN to address patient s wound care needs. FACE TO FACE ENCOUNTER: MEDICARE and MEDICAID PATIENTS: I certify that this patient is under my care and that I had a face-to-face encounter that meets the physician face-to-face encounter requirements with this patient on this date. The encounter with the patient was in whole or in part for the following MEDICAL CONDITION: (primary reason for Home Healthcare) MEDICAL NECESSITY: I certify, that based on my findings, NURSING services are a medically necessary home health service. HOME BOUND STATUS: I certify that my clinical findings support that this patient is homebound (i.e., Due to illness or injury, pt requires aid of supportive devices such as crutches, cane, wheelchairs, walkers, the use of special transportation or the assistance of another person to leave their place of residence. There is a normal inability to leave the home and doing so requires considerable and taxing effort. Other absences are KEEANA, PIERATT ([id number]) for medical reasons /  religious services and are infrequent or of short duration when for other reasons). If current dressing causes regression in wound condition, may D/C ordered dressing product/s and apply Normal Saline Moist Dressing daily until next Wound Healing Center / Other MD appointment. Notify Wound Healing Center of regression in wound condition at [phone]. Please direct any NON-WOUND related issues/requests for orders to patient's Primary Care Physician Negative Pressure Wound Therapy: Wound #9 Right Achilles: Wound VAC settings at 125/130 mmHg continuous pressure. Use BLACK/GREEN foam to wound cavity. Use WHITE foam to fill any tunnel/s and/or undermining. Change VAC dressing 3 X WEEK. Change canister as indicated when full. Nurse may titrate settings and frequency of dressing changes as clinically indicated. o #1 continue Santyl to the 2 abdominal wounds right leg and right Achilles. I think we can even put this under the wound VAC. #2 consider an alternative dressing to the abdomen possibly RTD #3 I think the wound VAC soon might outlive its usefulness, the granulation is largely towards the surface although there is a divot in the middle part still could benefit from the wound VAC #4 the copious purulent drainage in her abdominal wound seems to have done nicely between the Santyl and the Augmentin. No further antibiotics or cultures were felt to be necessary Electronic Signature(s) Signed: 12/25/2015 7:54:01 AM By: Baltazar Najjar MD Entered By: Baltazar Najjar on 12/24/2015 13:56:55 Destinee, Taber Vinetta Bergamo ([id number]) -------------------------------------------------------------------------------- SuperBill Details Patient Name: [name] Date of Service: 12/24/2015 Medical Record Patient Account Number: [id number] Number: Treating RN: Keryn, Nessler Jun 29, 1933 (82 y.o. Other Clinician: Date of Birth/Sex: Female) Treating ROBSON, MICHAEL Primary Care Physician/Extender:  Rex Kras, Heidi Physician: Weeks in Treatment: 1 Referring Physician: [name] Diagnosis Coding ICD-10 Codes Code Description E11.621 Type 2 diabetes mellitus with foot ulcer Unspecified open wound of abdominal wall, left lower quadrant without penetration into S31.104A peritoneal cavity, initial encounter E11.51 Type 2 diabetes mellitus with  diabetic peripheral angiopathy without gangrene Facility Procedures CPT4: Description Modifier Quantity Code 16109604 11042 - DEB SUBQ TISSUE 20 SQ CM/< 1 ICD-10 Description Diagnosis S31.104A Unspecified open wound of abdominal wall, left lower quadrant without penetration into peritoneal cavity, initial encounter CPT4: 54098119 11045 - DEB SUBQ TISS EA ADDL 20CM 3 ICD-10 Description Diagnosis E11.621 Type 2 diabetes mellitus with foot ulcer Physician Procedures CPT4: Description Modifier Quantity Code [id number] 11042 - WC PHYS SUBQ TISS 20 SQ CM 1 ICD-10 Description Diagnosis S31.104A Unspecified open wound of abdominal wall, left lower quadrant without penetration into peritoneal cavity, initial encounter CPT4: 6213086 11045 - WC PHYS SUBQ TISS EA ADDL 20 CM 3 ICD-10 Description Diagnosis SHANDELL, GIOVANNI ([id number]) Electronic Signature(s) Signed: 12/25/2015 7:54:01 AM By: Baltazar Najjar MD Entered By: Baltazar Najjar on 12/24/2015 13:57:41

## 2015-12-31 ENCOUNTER — Encounter: Payer: Medicare Other | Attending: Internal Medicine | Admitting: Internal Medicine

## 2015-12-31 DIAGNOSIS — I5033 Acute on chronic diastolic (congestive) heart failure: Secondary | ICD-10-CM | POA: Insufficient documentation

## 2015-12-31 DIAGNOSIS — S31104A Unspecified open wound of abdominal wall, left lower quadrant without penetration into peritoneal cavity, initial encounter: Secondary | ICD-10-CM | POA: Insufficient documentation

## 2015-12-31 DIAGNOSIS — X58XXXA Exposure to other specified factors, initial encounter: Secondary | ICD-10-CM | POA: Diagnosis not present

## 2015-12-31 DIAGNOSIS — N186 End stage renal disease: Secondary | ICD-10-CM | POA: Diagnosis not present

## 2015-12-31 DIAGNOSIS — E11621 Type 2 diabetes mellitus with foot ulcer: Secondary | ICD-10-CM | POA: Diagnosis not present

## 2015-12-31 DIAGNOSIS — L89612 Pressure ulcer of right heel, stage 2: Secondary | ICD-10-CM | POA: Insufficient documentation

## 2015-12-31 DIAGNOSIS — Z992 Dependence on renal dialysis: Secondary | ICD-10-CM | POA: Diagnosis not present

## 2015-12-31 DIAGNOSIS — E1151 Type 2 diabetes mellitus with diabetic peripheral angiopathy without gangrene: Secondary | ICD-10-CM | POA: Diagnosis not present

## 2015-12-31 DIAGNOSIS — L97319 Non-pressure chronic ulcer of right ankle with unspecified severity: Secondary | ICD-10-CM | POA: Diagnosis not present

## 2015-12-31 DIAGNOSIS — I482 Chronic atrial fibrillation: Secondary | ICD-10-CM | POA: Diagnosis not present

## 2015-12-31 DIAGNOSIS — Z8673 Personal history of transient ischemic attack (TIA), and cerebral infarction without residual deficits: Secondary | ICD-10-CM | POA: Diagnosis not present

## 2015-12-31 DIAGNOSIS — Z794 Long term (current) use of insulin: Secondary | ICD-10-CM | POA: Diagnosis not present

## 2015-12-31 DIAGNOSIS — I132 Hypertensive heart and chronic kidney disease with heart failure and with stage 5 chronic kidney disease, or end stage renal disease: Secondary | ICD-10-CM | POA: Insufficient documentation

## 2015-12-31 DIAGNOSIS — E1122 Type 2 diabetes mellitus with diabetic chronic kidney disease: Secondary | ICD-10-CM | POA: Diagnosis not present

## 2015-12-31 DIAGNOSIS — T8781 Dehiscence of amputation stump: Secondary | ICD-10-CM | POA: Diagnosis not present

## 2016-01-01 NOTE — Progress Notes (Signed)
[name] ([id number]) Visit Report for 12/31/2015 Arrival Information Details Patient Name: [name], [name]. Date of Service: 12/31/2015 1:30 PM Medical Record Patient Account Number: [id number] Number: Treating RN: Staphany, Ditton [birth year] (82 y.o. Other Clinician: Date of Birth/Sex: Female) Treating ROBSON, MICHAEL Primary Care Physician: [name] Physician/Extender: G Referring Physician: Charolotte Capuchin in Treatment: 2 Visit Information History Since Last Visit Added or deleted any medications: No Patient Arrived: Wheel Chair Any new allergies or adverse reactions: No Arrival Time: 13:29 Had a fall or experienced change in No Accompanied By: son activities of daily living that may affect Transfer Assistance: Manual risk of falls: Patient Identification Verified: Yes Signs or symptoms of abuse/neglect since last No Secondary Verification Process Yes visito Completed: Hospitalized since last visit: No Patient Has Alerts: Yes Pain Present Now: No Patient Alerts: Patient on Blood Thinner DMII Warfarin ABI Castine Bilateral NO BP RIGHT ARM Electronic Signature(s) Signed: 12/31/2015 4:48:52 PM By: Curtis Sites Entered By: Curtis Sites on 12/31/2015 13:30:29 [name] ([id number]) -------------------------------------------------------------------------------- Encounter Discharge Information Details Patient Name: [name]. Date of Service: 12/31/2015 1:30 PM Medical Record Patient Account Number: [id number] Number: Treating RN: Damesha, Lawler [birth year] (82 y.o. Other Clinician: Date of Birth/Sex: Female) Treating ROBSON, MICHAEL Primary Care Physician: [name] Physician/Extender: G Referring Physician: Charolotte Capuchin in Treatment: 2 Encounter Discharge Information Items Discharge Pain Level: 0 Discharge Condition: Stable Ambulatory Status: Wheelchair Discharge Destination: Home Transportation:  Private Auto Accompanied By: son Schedule Follow-up Appointment: Yes Medication Reconciliation completed No and provided to Patient/Care [name]: Provided on Clinical Summary of Care: 12/31/2015 Form Type Recipient Paper Patient [facility] Electronic Signature(s) Signed: 12/31/2015 3:39:12 PM By: Curtis Sites Previous Signature: 12/31/2015 3:12:52 PM Version By: Gwenlyn Perking Entered By: Curtis Sites on 12/31/2015 15:39:12 [name] ([id number]) -------------------------------------------------------------------------------- Lower Extremity Assessment Details Patient Name: [name] Date of Service: 12/31/2015 1:30 PM Medical Record Patient Account Number: [id number] Number: Treating RN: Candis, Kabel 01-21-1934 (82 y.o. Other Clinician: Date of Birth/Sex: Female) Treating ROBSON, MICHAEL Primary Care Physician: [name] Physician/Extender: G Referring Physician: Charolotte Capuchin in Treatment: 2 Vascular Assessment Pulses: Posterior Tibial Dorsalis Pedis Palpable: [Right:Yes] Extremity colors, hair growth, and conditions: Extremity Color: [Right:Hyperpigmented] Hair Growth on Extremity: [Right:No] Temperature of Extremity: [Right:Warm] Capillary Refill: [Right:< 3 seconds] Electronic Signature(s) Signed: 12/31/2015 4:48:52 PM By: Curtis Sites Entered By: Curtis Sites on 12/31/2015 13:40:02 [name] ([id number]) -------------------------------------------------------------------------------- Multi Wound Chart Details Patient Name: [name] Date of Service: 12/31/2015 1:30 PM Medical Record Patient Account Number: [id number] Number: Treating RN: Saoirse, Legere 07/09/1933 (82 y.o. 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 Comorbid History: Arrhythmia, Congestive Arrhythmia, Congestive Arrhythmia, Congestive Heart Failure, Heart Failure, Heart Failure, Hypertension, Type II Hypertension, Type II Hypertension, Type II Diabetes Diabetes Diabetes Date Acquired: 06/02/2015 08/12/2015 06/10/2015 Weeks of Treatment: 2 2 2  Wound Status: Open Open Open Pending Amputation on Yes No No Presentation: Measurements L x W x D 1.6x1.6x0.1 4.3x8.2x1.5 1.2x9.1x1.6 (cm) Area (cm) : 2.011 27.693 8.577 Volume (cm) : 0.201 41.54 13.722 %  Reduction in Area: -9.40% 30.00% 51.40% % Reduction in Volume: 45.40% 47.50% 64.70% Classification: [name] ([id number]) Full Thickness Without Full Thickness Without Full Thickness Without Exposed Support Exposed Support Exposed Support Structures Structures Structures HBO Classification: Grade 1 N/A N/A Exudate Amount: Medium Large Large Exudate Type: Serous Serous Serous Exudate Color: amber Media planner Foul Odor After Yes Yes Yes Cleansing: Odor Anticipated Due to No No No Product Use: Wound Margin: Flat and Intact Flat and Intact Flat and Intact Granulation Amount: Small (1-33%) Large (67-100%) Large (67-100%) Granulation Quality: Pink Red Red Necrotic Amount: Large (67-100%) Small (1-33%) Small (1-33%) Necrotic Tissue: Eschar, Adherent Slough Eschar, Adherent [name], Adherent Slough Exposed Structures: Fascia: No Fascia: No Fascia: No Fat: No Fat: No Fat: No Tendon: No Tendon: No Tendon: No Muscle: No Muscle: No Muscle: No Joint: No Joint: No Joint:  No Bone: No Bone: No Bone: No Limited to Skin Limited to Skin Limited to Skin Breakdown Breakdown Breakdown Epithelialization: None None None Periwound Skin Texture: Edema: No Edema: No Edema: No Excoriation: No Excoriation: No Excoriation: No Induration: No Induration: No Induration: No Callus: No Callus: No Callus: No Crepitus: No Crepitus: No Crepitus: No Fluctuance: No Fluctuance: No Fluctuance: No Friable: No Friable: No Friable: No Rash: No Rash: No Rash: No Scarring: No Scarring: No Scarring: No Periwound Skin Maceration: Yes Moist: Yes Moist: Yes Moisture: Moist: Yes Maceration: No Maceration: No Dry/Scaly: No Dry/Scaly: No Dry/Scaly: No Periwound Skin Color: Erythema: Yes Erythema: Yes Erythema: Yes Atrophie Blanche: No Atrophie Blanche: No Atrophie Blanche: No Cyanosis: No Cyanosis: No Cyanosis: No Ecchymosis: No Ecchymosis: No Ecchymosis: No Hemosiderin Staining: No Hemosiderin Staining: No Hemosiderin Staining: No Mottled: No Mottled: No Mottled: No Pallor: No Pallor: No Pallor: No Rubor: No Rubor: No Rubor: No Erythema Location: Circumferential Circumferential Circumferential Temperature: No Abnormality No Abnormality No Abnormality Tenderness on Yes Yes Yes Palpation: Wound Preparation: REALITY, DEJONGE ([id number]) Ulcer Cleansing: Ulcer Cleansing: Ulcer Cleansing: Rinsed/Irrigated with Rinsed/Irrigated with Rinsed/Irrigated with Saline Saline Saline Topical Anesthetic Topical Anesthetic Topical Anesthetic Applied: Other: lidocaine Applied: Other: lidocaine Applied: Other: lidocaine 4% 4% 4% Wound Number: [id number]  Photos: Wound Location: Left Calcaneus Left Amputation Site - Right Lower Leg - Lateral Digit Wounding Event: Gradually Appeared Surgical Injury Gradually Appeared Primary Etiology: Arterial Insufficiency Ulcer Open Surgical Wound Arterial Insufficiency Ulcer Comorbid History: Arrhythmia, Congestive Arrhythmia,  Congestive Arrhythmia, Congestive Heart Failure, Heart Failure, Heart Failure, Hypertension, Type II Hypertension, Type II Hypertension, Type II Diabetes Diabetes Diabetes Date Acquired: 12/31/2015 11/11/2015 06/02/2015 Weeks of Treatment: 0 0 2 Wound Status: Open Open Open Pending Amputation on No No Yes Presentation: Measurements L x W x D 1.1x0.3x0.3 1.2x0.8x0.1 1.3x1.1x0.1 (cm) Area (cm) : 0.259 0.754 1.123 Volume (cm) : 0.078 0.075 0.112 % Reduction in Area: 0.00% 0.00% 89.20% % Reduction in Volume: 0.00% 0.00% 94.60% Classification: Full Thickness Without Full Thickness Without Full Thickness Without Exposed Support Exposed Support Exposed Support Structures Structures Structures HBO Classification: Grade 1 N/A Grade 1 Exudate Amount: Large Medium Medium Exudate Type: Serosanguineous Serous Serous Exudate Color: red, brown amber amber Foul Odor After No No Yes Cleansing: Odor Anticipated Due to N/A N/A No Product Use: Wound Margin: Flat and Intact Flat and Intact Flat and Intact Granulation Amount: None Present (0%) None Present (0%) Medium (34-66%) Granulation Quality: N/A N/A Joleene, Burnham ([id number]) Necrotic Amount: Large (67-100%) Large (67-100%) Medium (34-66%) Necrotic Tissue: Eschar, Adherent Slough Eschar, Adherent Slough Eschar, Adherent Slough Exposed Structures: Fascia: No  Fascia: No Fascia: No Fat: No Fat: No Fat: No Tendon: No Tendon: No Tendon: No Muscle: No Muscle: No Muscle: No Joint: No Joint: No Joint: No Bone: No Bone: No Bone: No Limited to Skin Limited to Skin Limited to Skin Breakdown Breakdown Breakdown Epithelialization: None None None Periwound Skin Texture: Edema: No Edema: No Edema: No Excoriation: No Excoriation: No Excoriation: No Induration: No Induration: No Induration: No Callus: No Callus: No Callus: No Crepitus: No Crepitus: No Crepitus: No Fluctuance: No Fluctuance: No Fluctuance: No Friable:  No Friable: No Friable: No Rash: No Rash: No Rash: No Scarring: No Scarring: No Scarring: No Periwound Skin Moist: Yes Maceration: Yes Maceration: Yes Moisture: Maceration: No Moist: Yes Moist: Yes Dry/Scaly: No Dry/Scaly: No Dry/Scaly: No Periwound Skin Color: Atrophie Blanche: No Atrophie Blanche: No Erythema: Yes Cyanosis: No Cyanosis: No Atrophie Blanche: No Ecchymosis: No Ecchymosis: No Cyanosis: No Erythema: No Erythema: No Ecchymosis: No Hemosiderin Staining: No Hemosiderin Staining: No Hemosiderin Staining: No Mottled: No Mottled: No Mottled: No Pallor: No Pallor: No Pallor: No Rubor: No Rubor: No Rubor: No Erythema Location: N/A N/A Circumferential Temperature: No Abnormality No Abnormality No Abnormality Tenderness on No No No Palpation: Wound Preparation: Ulcer Cleansing: Ulcer Cleansing: Ulcer Cleansing: Rinsed/Irrigated with Rinsed/Irrigated with Rinsed/Irrigated with Saline Saline Saline Topical Anesthetic Topical Anesthetic Topical Anesthetic Applied: Other: lidocaine Applied: Other: lidocaine Applied: Other: lidocaine 4% 4% 4% Wound Number: 9 N/A N/A Photos: N/A N/A KIONA, BLUME ([id number]) Wound Location: Right Achilles N/A N/A Wounding Event: Gradually Appeared N/A N/A Primary Etiology: Arterial Insufficiency Ulcer N/A N/A Comorbid History: Arrhythmia, Congestive N/A N/A Heart Failure, Hypertension, Type II Diabetes Date Acquired: 06/02/2015 N/A N/A Weeks of Treatment: 2 N/A N/A Wound Status: Open N/A N/A Pending Amputation on Yes N/A N/A Presentation: Measurements L x W x D 8.7x4.9x0.3 N/A N/A (cm) Area (cm) : 33.482 N/A N/A Volume (cm) : 10.044 N/A N/A % Reduction in Area: -2.70% N/A N/A % Reduction in Volume: -2.70% N/A N/A Classification: Full Thickness Without N/A N/A Exposed Support Structures HBO Classification: Grade 1 N/A N/A Exudate Amount: N/A N/A N/A Exudate Type: N/A N/A N/A Exudate Color: N/A N/A  N/A Foul Odor After Yes N/A N/A Cleansing: Odor Anticipated Due to No N/A N/A Product Use: Wound Margin: Flat and Intact N/A N/A Granulation Amount: Large (67-100%) N/A N/A Granulation Quality: Red N/A N/A Necrotic Amount: Small (1-33%) N/A N/A Necrotic Tissue: Eschar, Adherent Slough N/A N/A Exposed Structures: Fascia: No N/A N/A Fat: No Tendon: No Muscle: No Joint: No Bone: No Limited to Skin Breakdown Epithelialization: None N/A N/A Periwound Skin Texture: Edema: No N/A N/A Excoriation: No Induration: No Callus: No Crepitus: No Fluctuance: No IRETA, PULLMAN ([id number]) Friable: No Rash: No Scarring: No Periwound Skin Maceration: Yes N/A N/A Moisture: Moist: Yes Dry/Scaly: No Periwound Skin Color: Erythema: Yes N/A N/A Atrophie Blanche: No Cyanosis: No Ecchymosis: No Hemosiderin Staining: No Mottled: No Pallor: No Rubor: No Erythema Location: Circumferential N/A N/A Temperature: N/A N/A N/A Tenderness on Yes N/A N/A Palpation: Wound Preparation: Ulcer Cleansing: N/A N/A Rinsed/Irrigated with Saline Topical Anesthetic Applied: Other: lidocaine 4% Treatment Notes Electronic Signature(s) Signed: 12/31/2015 4:48:52 PM By: Curtis Sites Entered By: Curtis Sites on 12/31/2015 14:19:24 [name] ([id number]) -------------------------------------------------------------------------------- Multi-Disciplinary Care Plan Details Patient Name: ETHELL, BLATCHFORD. Date of Service: 12/31/2015 1:30 PM Medical Record Patient Account Number: [id number] Number: Treating RN: Elbia, Paro May 25, 1934 (82 y.o. Other Clinician: Date of Birth/Sex: Female) Treating ROBSON, MICHAEL Primary Care Physician: Gavin Potters,  Heidi Physician/Extender: G Referring Physician: Charolotte Capuchin in Treatment: 2 Active Inactive Abuse / Safety / Falls / Self Care Management Nursing Diagnoses: Impaired physical mobility Potential for falls Goals: Patient will  remain injury free Date Initiated: 12/17/2015 Goal Status: Active Interventions: Assess fall risk on admission and as needed Notes: Wound/Skin Impairment Nursing Diagnoses: Impaired tissue integrity Goals: Patient/caregiver will verbalize understanding of skin care regimen Date Initiated: 12/17/2015 Goal Status: Active Ulcer/skin breakdown will have a volume reduction of 30% by week 4 Date Initiated: 12/17/2015 Goal Status: Active Ulcer/skin breakdown will have a volume reduction of 50% by week 8 Date Initiated: 12/17/2015 Goal Status: Active Ulcer/skin breakdown will have a volume reduction of 80% by week 12 Date Initiated: 12/17/2015 Goal Status: Active Ulcer/skin breakdown will heal within 14 weeks PENELOPE, FRANTZ ([id number]) Date Initiated: 12/17/2015 Goal Status: Active Interventions: Assess patient/caregiver ability to obtain necessary supplies Assess patient/caregiver ability to perform ulcer/skin care regimen upon admission and as needed Assess ulceration(s) every visit Notes: Electronic Signature(s) Signed: 12/31/2015 4:48:52 PM By: Curtis Sites Entered By: Curtis Sites on 12/31/2015 14:19:13 [name] ([id number]) -------------------------------------------------------------------------------- Pain Assessment Details Patient Name: [name] Date of Service: 12/31/2015 1:30 PM Medical Record Patient Account Number: [id number] Number: Treating RN: Vanna, Petzoldt 24-Jan-1934 (82 y.o. Other Clinician: Date of Birth/Sex: Female) Treating ROBSON, MICHAEL Primary Care Physician: [name] Physician/Extender: G Referring Physician: Charolotte Capuchin in Treatment: 2 Active Problems Location of Pain Severity and Description of Pain Patient Has Paino No Site Locations Pain Management and Medication Current Pain Management: Notes Topical or injectable lidocaine is offered to patient for acute pain when surgical debridement is performed.  If needed, Patient is instructed to use over the counter pain medication for the following 24-48 hours after debridement. Wound care MDs do not prescribed pain medications. Patient has chronic pain or uncontrolled pain. Patient has been instructed to make an appointment with their Primary Care Physician for pain management. Electronic Signature(s) Signed: 12/31/2015 4:48:52 PM By: Curtis Sites Entered By: Curtis Sites on 12/31/2015 13:30:47 [name] ([id number]) -------------------------------------------------------------------------------- Patient/Caregiver Education Details Patient Name: [name] Date of Service: 12/31/2015 1:30 PM Medical Record Patient Account Number: [id number] Number: Treating RN: Danieliz, Forthman [birth year] (82 y.o. Other Clinician: Date of Birth/Gender: Female) Treating ROBSON, MICHAEL Primary Care Physician: [name] Physician/Extender: G Referring Physician: Charolotte Capuchin in Treatment: 2 Education Assessment Education Provided To: Patient and Caregiver Education Topics Provided Wound/Skin Impairment: Handouts: Other: wound care as ordered Methods: Demonstration, Explain/Verbal, Printed Responses: State content correctly Electronic Signature(s) Signed: 12/31/2015 4:48:52 PM By: Curtis Sites Entered By: Curtis Sites on 12/31/2015 15:39:30 [name] ([id number]) -------------------------------------------------------------------------------- Wound Assessment Details Patient Name: [name] Date of Service: 12/31/2015 1:30 PM Medical Record Patient Account Number: [id number] Number: Treating RN: Ashlley, Hilgart 1934/01/19 (82 y.o. Other Clinician: Date of Birth/Sex: Female) Treating ROBSON, MICHAEL Primary Care Physician: [name] Physician/Extender: G Referring Physician: Charolotte Capuchin in Treatment: 2 Wound Status Wound Number: 10 Primary Arterial Insufficiency  Ulcer Etiology: Wound Location: Right Calcaneus Wound Open Wounding Event: Gradually Appeared Status: Date Acquired: 06/02/2015 Comorbid Arrhythmia, Congestive Heart Failure, Weeks Of Treatment: 2 History: Hypertension, Type II Diabetes Clustered Wound: No Pending Amputation On Presentation Photos Wound Measurements Length: (cm) 1.6 % Reduction in Width: (cm) 1.6 % Reduction in Depth: (cm) 0.1 Epithelializati Area: (cm) 2.011 Tunneling: Volume: (cm) 0.201 Undermining: Area: -9.4% Volume: 45.4% on: None No No Wound Description Full Thickness Without Foul Odor  Afte Classification: Exposed Support Structures Due to Product Diabetic Severity Grade 1 (Wagner): Wound Margin: Flat and Intact Exudate Amount: Medium Exudate Type: Serous Exudate Color: amber r Cleansing: Yes Use: No Wound Bed MYALEE, STENGEL ([id number]) Granulation Amount: Small (1-33%) Exposed Structure Granulation Quality: Pink Fascia Exposed: No Necrotic Amount: Large (67-100%) Fat Layer Exposed: No Necrotic Quality: Eschar, Adherent Slough Tendon Exposed: No Muscle Exposed: No Joint Exposed: No Bone Exposed: No Limited to Skin Breakdown Periwound Skin Texture Texture Color No Abnormalities Noted: No No Abnormalities Noted: No Callus: No Atrophie Blanche: No Crepitus: No Cyanosis: No Excoriation: No Ecchymosis: No Fluctuance: No Erythema: Yes Friable: No Erythema Location: Circumferential Induration: No Hemosiderin Staining: No Localized Edema: No Mottled: No Rash: No Pallor: No Scarring: No Rubor: No Moisture Temperature / Pain No Abnormalities Noted: No Temperature: No Abnormality Dry / Scaly: No Tenderness on Palpation: Yes Maceration: Yes Moist: Yes Wound Preparation Ulcer Cleansing: Rinsed/Irrigated with Saline Topical Anesthetic Applied: Other: lidocaine 4%, Treatment Notes Wound #10 (Right Calcaneus) 1. Cleansed with: Clean wound with Normal Saline 2.  Anesthetic Topical Lidocaine 4% cream to wound bed prior to debridement 4. Dressing Applied: Prisma Ag 5. Secondary Dressing Applied Guaze, ABD and kerlix/Conform Electronic Signature(s) Signed: 12/31/2015 4:48:52 PM By: Curtis Sites Entered By: Curtis Sites on 12/31/2015 14:12:14 MURLENE, REVELL ([id number]) JORDYNNE, MCCOWN ([id number]) -------------------------------------------------------------------------------- Wound Assessment Details Patient Name: [name] Date of Service: 12/31/2015 1:30 PM Medical Record Patient Account Number: [id number] Number: Treating RN: Latausha, Flamm 1933-07-23 (82 y.o. Other Clinician: Date of Birth/Sex: Female) Treating ROBSON, MICHAEL Primary Care Physician: [name] Physician/Extender: G Referring Physician: Charolotte Capuchin in Treatment: 2 Wound Status Wound Number: 11 Primary Arterial Insufficiency Ulcer Etiology: Wound Location: Abdomen - Lower Quadrant - Midline Wound Open Status: Wounding Event: Trauma Comorbid Arrhythmia, Congestive Heart Failure, Date Acquired: 08/12/2015 History: Hypertension, Type II Diabetes Weeks Of Treatment: 2 Clustered Wound: No Photos Wound Measurements Length: (cm) 4.3 % Reduction in A Width: (cm) 8.2 % Reduction in V Depth: (cm) 1.5 Epithelializatio Area: (cm) 27.693 Tunneling: Volume: (cm) 41.54 Undermining: rea: 30% olume: 47.5% n: None No No Wound Description Full Thickness Without Exposed Foul Odor After Classification: Support Structures Due to Product Wound Margin: Flat and Intact Exudate Large Amount: Exudate Type: Serous Exudate Color: amber Cleansing: Yes Use: No Wound Bed Granulation Amount: Large (67-100%) Exposed Structure TERRAN, HOLLENKAMP ([id number]) Granulation Quality: Red Fascia Exposed: No Necrotic Amount: Small (1-33%) Fat Layer Exposed: No Necrotic Quality: Eschar, Adherent Slough Tendon Exposed: No Muscle Exposed:  No Joint Exposed: No Bone Exposed: No Limited to Skin Breakdown Periwound Skin Texture Texture Color No Abnormalities Noted: No No Abnormalities Noted: No Callus: No Atrophie Blanche: No Crepitus: No Cyanosis: No Excoriation: No Ecchymosis: No Fluctuance: No Erythema: Yes Friable: No Erythema Location: Circumferential Induration: No Hemosiderin Staining: No Localized Edema: No Mottled: No Rash: No Pallor: No Scarring: No Rubor: No Moisture Temperature / Pain No Abnormalities Noted: No Temperature: No Abnormality Dry / Scaly: No Tenderness on Palpation: Yes Maceration: No Moist: Yes Wound Preparation Ulcer Cleansing: Rinsed/Irrigated with Saline Topical Anesthetic Applied: Other: lidocaine 4%, Treatment Notes Wound #11 (Midline Abdomen - Lower Quadrant) 1. Cleansed with: Clean wound with Normal Saline 2. Anesthetic Topical Lidocaine 4% cream to wound bed prior to debridement 4. Dressing Applied: Santyl Ointment 5. Secondary Dressing Applied ABD Pad Dry Gauze 7. Secured with Magazine features editor) Signed: 12/31/2015 4:48:52 PM By: Neldon Labella ([id number]) Entered By: Francesco Sor  Joanna on 12/31/2015 14:12:41 DANIQUE, HARTSOUGH ([id number]) -------------------------------------------------------------------------------- Wound Assessment Details Patient Name: DARILYN, STORBECK. Date of Service: 12/31/2015 1:30 PM Medical Record Patient Account Number: [id number] Number: Treating RN: Zlaty, Alexa September 20, 1933 (82 y.o. Other Clinician: Date of Birth/Sex: Female) Treating ROBSON, MICHAEL Primary Care Physician: [name] Physician/Extender: G Referring Physician: Charolotte Capuchin in Treatment: 2 Wound Status Wound Number: 12 Primary Arterial Insufficiency Ulcer Etiology: Wound Location: Left Abdomen - Lower Quadrant Wound Open Status: Wounding Event: Trauma Comorbid Arrhythmia, Congestive Heart Failure, Date  Acquired: 06/10/2015 History: Hypertension, Type II Diabetes Weeks Of Treatment: 2 Clustered Wound: No Photos Wound Measurements Length: (cm) 1.2 % Reduction in Ar Width: (cm) 9.1 % Reduction in Vo Depth: (cm) 1.6 Epithelialization Area: (cm) 8.577 Tunneling: Volume: (cm) 13.722 Undermining: ea: 51.4% lume: 64.7% : None No No Wound Description Full Thickness Without Exposed Foul Odor After Classification: Support Structures Due to Product U Wound Margin: Flat and Intact Exudate Large Amount: Exudate Type: Serous Exudate Color: amber Cleansing: Yes se: No Wound Bed Granulation Amount: Large (67-100%) Exposed Structure CONSTANCE, WHITTLE ([id number]) Granulation Quality: Red Fascia Exposed: No Necrotic Amount: Small (1-33%) Fat Layer Exposed: No Necrotic Quality: Eschar, Adherent Slough Tendon Exposed: No Muscle Exposed: No Joint Exposed: No Bone Exposed: No Limited to Skin Breakdown Periwound Skin Texture Texture Color No Abnormalities Noted: No No Abnormalities Noted: No Callus: No Atrophie Blanche: No Crepitus: No Cyanosis: No Excoriation: No Ecchymosis: No Fluctuance: No Erythema: Yes Friable: No Erythema Location: Circumferential Induration: No Hemosiderin Staining: No Localized Edema: No Mottled: No Rash: No Pallor: No Scarring: No Rubor: No Moisture Temperature / Pain No Abnormalities Noted: No Temperature: No Abnormality Dry / Scaly: No Tenderness on Palpation: Yes Maceration: No Moist: Yes Wound Preparation Ulcer Cleansing: Rinsed/Irrigated with Saline Topical Anesthetic Applied: Other: lidocaine 4%, Treatment Notes Wound #12 (Left Abdomen - Lower Quadrant) 1. Cleansed with: Clean wound with Normal Saline 2. Anesthetic Topical Lidocaine 4% cream to wound bed prior to debridement 4. Dressing Applied: Santyl Ointment 5. Secondary Dressing Applied ABD Pad Dry Gauze 7. Secured with Magazine features editor) Signed:  12/31/2015 4:48:52 PM By: Neldon Labella ([id number]) Entered By: Curtis Sites on 12/31/2015 14:13:07 [name] ([id number]) -------------------------------------------------------------------------------- Wound Assessment Details Patient Name: CHARNE, MCBRIEN. Date of Service: 12/31/2015 1:30 PM Medical Record Patient Account Number: [id number] Number: Treating RN: Tiwanna, Tuch 07/20/1933 (82 y.o. 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 Wound Open Wounding Event: Gradually Appeared Status: Date Acquired: 12/31/2015 Comorbid Arrhythmia, Congestive Heart Failure, Weeks Of Treatment: 0 History: Hypertension, Type II Diabetes Clustered Wound: No Photos Wound Measurements Length: (cm) 1.1 Width: (cm) 0.3 Depth: (cm) 0.3 Area: (cm) 0.259 Volume: (cm) 0.078 % Reduction in Area: 0% % Reduction in Volume: 0% Epithelialization: None Tunneling: No Undermining: No Wound Description Full Thickness Without Exposed Foul Odor Classification: Support Structures Diabetic Severity Grade 1 (Wagner): Wound Margin: Flat and Intact Exudate Amount: Large Exudate Type: Serosanguineous Exudate Color: red, brown After Cleansing: No Wound Bed Granulation Amount: None Present (0%) Exposed Structure ALUEL, SCHWARZ ([id number]) Necrotic Amount: Large (67-100%) Fascia Exposed: No Necrotic Quality: Eschar, Adherent Slough Fat Layer Exposed: No Tendon Exposed: No Muscle Exposed: No Joint Exposed: No Bone Exposed: No Limited to Skin Breakdown Periwound Skin Texture Texture Color No Abnormalities  Noted: No No Abnormalities Noted: No Callus: No Atrophie Blanche: No Crepitus: No Cyanosis:  No Excoriation: No Ecchymosis: No Fluctuance: No Erythema: No Friable: No Hemosiderin Staining: No Induration: No Mottled: No Localized Edema: No Pallor: No Rash: No Rubor: No Scarring: No Temperature / Pain Moisture Temperature: No Abnormality No Abnormalities Noted: No Dry / Scaly: No Maceration: No Moist: Yes Wound Preparation Ulcer Cleansing: Rinsed/Irrigated with Saline Topical Anesthetic Applied: Other: lidocaine 4%, Treatment Notes Wound #13 (Left Calcaneus) 1. Cleansed with: Clean wound with Normal Saline 2. Anesthetic Topical Lidocaine 4% cream to wound bed prior to debridement 4. Dressing Applied: Prisma Ag 5. Secondary Dressing Applied Guaze, ABD and kerlix/Conform Electronic Signature(s) Signed: 12/31/2015 4:48:52 PM By: Curtis Sites Entered By: Curtis Sites on 12/31/2015 14:13:35 [name] ([id number]) -------------------------------------------------------------------------------- Wound Assessment Details Patient Name: [name] Date of Service: 12/31/2015 1:30 PM Medical Record Patient Account Number: [id number] Number: Treating RN: Karlin, Binion Jun 23, 1933 (82 y.o. Other Clinician: Date of Birth/Sex: Female) Treating ROBSON, MICHAEL Primary Care Physician: [name] Physician/Extender: G Referring Physician: Charolotte Capuchin in Treatment: 2 Wound Status Wound Number: 14 Primary Open Surgical Wound Etiology: Wound Location: Left Amputation Site - Digit Wound Open Wounding Event: Surgical Injury Status: Date Acquired: 11/11/2015 Comorbid Arrhythmia, Congestive Heart Failure, Weeks Of Treatment: 0 History: Hypertension, Type II Diabetes Clustered Wound: No Photos Wound Measurements Length: (cm) 1.2 Width: (cm) 0.8 Depth: (cm) 0.1 Area: (cm) 0.754 Volume: (cm) 0.075 % Reduction in Area: 0% % Reduction in Volume: 0% Epithelialization: None Tunneling: No Undermining: No Wound Description Full  Thickness Without Exposed Foul Odor Aft Classification: Support Structures Wound Margin: Flat and Intact Exudate Medium Amount: Exudate Type: Serous Exudate Color: amber er Cleansing: No Wound Bed Granulation Amount: None Present (0%) Exposed Structure Necrotic Amount: Large (67-100%) Fascia Exposed: No CHEVELLA, PEARCE ([id number]) Necrotic Quality: Eschar, Adherent Slough Fat Layer Exposed: No Tendon Exposed: No Muscle Exposed: No Joint Exposed: No Bone Exposed: No Limited to Skin Breakdown Periwound Skin Texture Texture Color No Abnormalities Noted: No No Abnormalities Noted: No Callus: No Atrophie Blanche: No Crepitus: No Cyanosis: No Excoriation: No Ecchymosis: No Fluctuance: No Erythema: No Friable: No Hemosiderin Staining: No Induration: No Mottled: No Localized Edema: No Pallor: No Rash: No Rubor: No Scarring: No Temperature / Pain Moisture Temperature: No Abnormality No Abnormalities Noted: No Dry / Scaly: No Maceration: Yes Moist: Yes Wound Preparation Ulcer Cleansing: Rinsed/Irrigated with Saline Topical Anesthetic Applied: Other: lidocaine 4%, Treatment Notes Wound #14 (Left Amputation Site - Digit) 1. Cleansed with: Clean wound with Normal Saline 2. Anesthetic Topical Lidocaine 4% cream to wound bed prior to debridement 4. Dressing Applied: Prisma Ag 5. Secondary Dressing Applied Guaze, ABD and kerlix/Conform Electronic Signature(s) Signed: 12/31/2015 4:48:52 PM By: Curtis Sites Entered By: Curtis Sites on 12/31/2015 14:14:15 [name] ([id number]) -------------------------------------------------------------------------------- Wound Assessment Details Patient Name: [name] Date of Service: 12/31/2015 1:30 PM Medical Record Patient Account Number: [id number] Number: Treating RN: Norissa, Bartee [birth year] (82 y.o. Other Clinician: Date of Birth/Sex: Female) Treating ROBSON, MICHAEL Primary Care  Physician: [name] Physician/Extender: G Referring Physician: Charolotte Capuchin in Treatment: 2 Wound Status Wound Number: 8 Primary Arterial Insufficiency Ulcer Etiology: Wound Location: Right Lower Leg - Lateral Wound Open Wounding Event: Gradually Appeared Status: Date Acquired: 06/02/2015 Comorbid Arrhythmia, Congestive Heart Failure, Weeks Of Treatment: 2 History: Hypertension, Type II Diabetes Clustered Wound: No Pending Amputation On Presentation Photos Wound Measurements Length: (cm) 1.3 % Reduction in Width: (cm)  1.1 % Reduction in Depth: (cm) 0.1 Epithelializati Area: (cm) 1.123 Tunneling: Volume: (cm) 0.112 Undermining: Area: 89.2% Volume: 94.6% on: None No No Wound Description Full Thickness Without Foul Odor Afte Classification: Exposed Support Structures Due to Product Diabetic Severity Grade 1 (Wagner): Wound Margin: Flat and Intact Exudate Amount: Medium Exudate Type: Serous Exudate Color: amber r Cleansing: Yes Use: No Wound Bed CORRINNA, KARAPETYAN ([id number]) Granulation Amount: Medium (34-66%) Exposed Structure Granulation Quality: Red, Pink Fascia Exposed: No Necrotic Amount: Medium (34-66%) Fat Layer Exposed: No Necrotic Quality: Eschar, Adherent Slough Tendon Exposed: No Muscle Exposed: No Joint Exposed: No Bone Exposed: No Limited to Skin Breakdown Periwound Skin Texture Texture Color No Abnormalities Noted: No No Abnormalities Noted: No Callus: No Atrophie Blanche: No Crepitus: No Cyanosis: No Excoriation: No Ecchymosis: No Fluctuance: No Erythema: Yes Friable: No Erythema Location: Circumferential Induration: No Hemosiderin Staining: No Localized Edema: No Mottled: No Rash: No Pallor: No Scarring: No Rubor: No Moisture Temperature / Pain No Abnormalities Noted: No Temperature: No Abnormality Dry / Scaly: No Maceration: Yes Moist: Yes Wound Preparation Ulcer Cleansing: Rinsed/Irrigated with  Saline Topical Anesthetic Applied: Other: lidocaine 4%, Treatment Notes Wound #8 (Right, Lateral Lower Leg) 1. Cleansed with: Clean wound with Normal Saline 2. Anesthetic Topical Lidocaine 4% cream to wound bed prior to debridement 4. Dressing Applied: Prisma Ag 5. Secondary Dressing Applied Guaze, ABD and kerlix/Conform Electronic Signature(s) Signed: 12/31/2015 4:48:52 PM By: Curtis Sites Entered By: Curtis Sites on 12/31/2015 14:15:02 SHOSHANNA, MCQUITTY ([id number]) TAMEE, BATTIN ([id number]) -------------------------------------------------------------------------------- Wound Assessment Details Patient Name: [name] Date of Service: 12/31/2015 1:30 PM Medical Record Patient Account Number: [id number] Number: Treating RN: Malesha, Suliman September 09, 1933 (82 y.o. Other Clinician: Date of Birth/Sex: Female) Treating ROBSON, MICHAEL Primary Care Physician: [name] Physician/Extender: G Referring Physician: Charolotte Capuchin in Treatment: 2 Wound Status Wound Number: 9 Primary Arterial Insufficiency Ulcer Etiology: Wound Location: Right Achilles Wound Open Wounding Event: Gradually Appeared Status: Date Acquired: 06/02/2015 Comorbid Arrhythmia, Congestive Heart Failure, Weeks Of Treatment: 2 History: Hypertension, Type II Diabetes Clustered Wound: No Pending Amputation On Presentation Photos Wound Measurements Length: (cm) 8.7 % Reduction i Width: (cm) 4.9 % Reduction i Depth: (cm) 0.3 Epithelializa Area: (cm) 33.482 Tunneling: Volume: (cm) 10.044 Undermining: n Area: -2.7% n Volume: -2.7% tion: None No No Wound Description Full Thickness Without Classification: Exposed Support Structures Diabetic Severity Grade 1 (Wagner): Wound Margin: Flat and Intact Foul Odor After Cleansing: Yes Due to Product Use: No Wound Bed Granulation Amount: Large (67-100%) Exposed Structure Granulation Quality: Red Fascia Exposed:  No Necrotic Amount: Small (1-33%) Fat Layer Exposed: No NEISHA, HINGER ([id number]) Necrotic Quality: Eschar, Adherent Slough Tendon Exposed: No Muscle Exposed: No Joint Exposed: No Bone Exposed: No Limited to Skin Breakdown Periwound Skin Texture Texture Color No Abnormalities Noted: No No Abnormalities Noted: No Callus: No Atrophie Blanche: No Crepitus: No Cyanosis: No Excoriation: No Ecchymosis: No Fluctuance: No Erythema: Yes Friable: No Erythema Location: Circumferential Induration: No Hemosiderin Staining: No Localized Edema: No Mottled: No Rash: No Pallor: No Scarring: No Rubor: No Moisture Temperature / Pain No Abnormalities Noted: No Tenderness on Palpation: Yes Dry / Scaly: No Maceration: Yes Moist: Yes Wound Preparation Ulcer Cleansing: Rinsed/Irrigated with Saline Topical Anesthetic Applied: Other: lidocaine 4%, Treatment Notes Wound #9 (Right Achilles) 1. Cleansed with: Clean wound with Normal Saline 2. Anesthetic Topical Lidocaine 4% cream to wound bed prior to debridement 4. Dressing Applied: Prisma Ag 5. Secondary Dressing Applied Guaze, ABD and kerlix/Conform  Electronic Signature(s) Signed: 12/31/2015 4:48:52 PM By: Curtis Sites Entered By: Curtis Sites on 12/31/2015 14:15:29 [name] ([id number]) -------------------------------------------------------------------------------- Vitals Details Patient Name: [name] Date of Service: 12/31/2015 1:30 PM Medical Record Patient Account Number: [id number] Number: Treating RN: Yasmyn, Bellisario 1933/11/30 (82 y.o. Other Clinician: Date of Birth/Sex: Female) Treating ROBSON, MICHAEL Primary Care Physician: [name] Physician/Extender: G Referring Physician: Charolotte Capuchin in Treatment: 2 Vital Signs Time Taken: 13:31 Temperature (F): 98.2 Height (in): 65 Pulse (bpm): 69 Weight (lbs): 160 Respiratory Rate (breaths/min): 18 Body Mass Index (BMI):  26.6 Blood Pressure (mmHg): 113/43 Reference Range: 80 - 120 mg / dl Electronic Signature(s) Signed: 12/31/2015 4:48:52 PM By: Curtis Sites Entered By: Curtis Sites on 12/31/2015 13:31:36

## 2016-01-01 NOTE — Progress Notes (Signed)
PEGAH, SEGEL ([id number]) Visit Report for 12/31/2015 Biopsy Details Patient Name: [name], [name]. Date of Service: 12/31/2015 1:30 PM Medical Record Patient Account Number: [id number] Number: Treating RN: Runette, Scifres [birth year] (82 y.o. Other Clinician: Date of Birth/Sex: Female) Treating Akeiba Axelson Primary Care Physician/Extender: Rex Kras, Heidi Physician: Referring Physician: Charolotte Capuchin in Treatment: 2 Biopsy Performed for: Wound #11 Midline Abdomen - Lower Quadrant Location(s): Wound Margin Performed By: Physician Maxwell Caul, MD Tissue Punch: Yes Size (mm): 3 Number of Specimens Taken: 2 Specimen Sent To Pathology: Yes Time-Out Taken: Yes Pain Control: Lidocaine Injectable Lidocaine Percent: 5% Instrument: Forceps, Other: punch Bleeding: Minimum Hemostasis Achieved: Silver Nitrate Procedural Pain: 0 Post Procedural Pain: 0 Response to Treatment: Procedure was tolerated well Post Procedure Diagnosis Same as Pre-procedure Electronic Signature(s) Signed: 01/01/2016 7:57:27 AM By: Baltazar Najjar MD Entered By: Baltazar Najjar on 12/31/2015 14:36:51 [name] ([id number]) -------------------------------------------------------------------------------- Chief Complaint Document Details Patient Name: [name]. Date of Service: 12/31/2015 1:30 PM Medical Record Patient Account Number: [id number] Number: Treating RN: Zanayah, Shadowens 11/22/1933 (82 y.o. Other Clinician: Date of Birth/Sex: Female) Treating Vonda Harth Primary Care Physician/Extender: Rex Kras, Heidi Physician: Referring Physician: Charolotte Capuchin in Treatment: 2 Information Obtained from: Patient Chief Complaint Patients presents for treatment of an open diabetic ulcer, arterial ulcer and pressure ulcers to the right lower extremities which is a complex etiology for the last several months. He also has ulcers on her abdomen in the  left lower quadrant and left suprapubic area which she's had for about 4 months. 12/17/15; patient returns to clinic apparently referred back from vascular surgery for ongoing wound care. Electronic Signature(s) Signed: 01/01/2016 7:57:27 AM By: Baltazar Najjar MD Entered By: Baltazar Najjar on 12/31/2015 14:38:22 [name] ([id number]) -------------------------------------------------------------------------------- Debridement Details Patient Name: [name] Date of Service: 12/31/2015 1:30 PM Medical Record Patient Account Number: [id number] Number: Treating RN: Sarahelizabeth, Conway 1933-10-13 (82 y.o. Other Clinician: Date of Birth/Sex: Female) Treating Teron Blais Primary Care Physician/Extender: Rex Kras, Heidi Physician: Referring Physician: Charolotte Capuchin in Treatment: 2 Debridement Performed for Wound #13 Left Calcaneus Assessment: Performed By: Physician Maxwell Caul, MD Debridement: Debridement Pre-procedure Yes Verification/Time Out Taken: Start Time: 14:21 Pain Control: Lidocaine 4% Topical Solution Level: Skin/Subcutaneous Tissue Total Area Debrided (L x 1.1 (cm) x 0.3 (cm) = 0.33 (cm) W): Tissue and other Viable, Non-Viable, Eschar, Fibrin/Slough, Subcutaneous material debrided: Instrument: Curette Bleeding: Minimum Hemostasis Achieved: Pressure End Time: 14:23 Procedural Pain: 0 Post Procedural Pain: 0 Response to Treatment: Procedure was tolerated well Post Debridement Measurements of Total Wound Length: (cm) 1.1 Width: (cm) 0.3 Depth: (cm) 0.3 Volume: (cm) 0.078 Post Procedure Diagnosis Same as Pre-procedure Electronic Signature(s) Signed: 12/31/2015 4:48:52 PM By: Curtis Sites Signed: 01/01/2016 7:57:27 AM By: Baltazar Najjar MD Entered By: Baltazar Najjar on 12/31/2015 14:37:12 [name], [name] ([id number]) [name], [name]  ([id number]) -------------------------------------------------------------------------------- Debridement Details Patient Name: [name]. Date of Service: 12/31/2015 1:30 PM Medical Record Patient Account Number: [id number] Number: Treating RN: Marcha, Licklider 03-13-1934 (82 y.o. Other Clinician: Date of Birth/Sex: Female) Treating Markelle Asaro Primary Care Physician/Extender: Rex Kras, Heidi Physician: Referring Physician: Charolotte Capuchin in Treatment: 2 Debridement Performed for Wound #14 Left Amputation Site - Digit Assessment: Performed By: Physician Maxwell Caul, MD Debridement: Debridement Pre-procedure Yes Verification/Time Out Taken: Start Time: 14:15 Pain Control: Lidocaine 4% Topical Solution Level: Skin/Subcutaneous Tissue Total Area Debrided (L x 1.2 (cm) x 0.8 (cm) =  0.96 (cm) W): Tissue and other Viable, Non-Viable, Eschar, Fibrin/Slough, Subcutaneous material debrided: Instrument: Curette Bleeding: Minimum Hemostasis Achieved: Pressure End Time: 14:19 Procedural Pain: 0 Post Procedural Pain: 0 Response to Treatment: Procedure was tolerated well Post Debridement Measurements of Total Wound Length: (cm) 1.2 Width: (cm) 0.8 Depth: (cm) 0.3 Volume: (cm) 0.226 Post Procedure Diagnosis Same as Pre-procedure Electronic Signature(s) Signed: 12/31/2015 4:48:52 PM By: Curtis Sites Signed: 01/01/2016 7:57:27 AM By: Baltazar Najjar MD Entered By: Baltazar Najjar on 12/31/2015 14:37:36 [name], [name] ([id number]) ZEMIRA, ZEHRING ([id number]) -------------------------------------------------------------------------------- Debridement Details Patient Name: [name]. Date of Service: 12/31/2015 1:30 PM Medical Record Patient Account Number: [id number] Number: Treating RN: Ourania, Hamler Apr 22, 1934 (82 y.o. Other Clinician: Date of Birth/Sex: Female) Treating Lariyah Shetterly Primary Care Physician/Extender:  Rex Kras, Heidi Physician: Referring Physician: Charolotte Capuchin in Treatment: 2 Debridement Performed for Wound #8 Right,Lateral Lower Leg Assessment: Performed By: Physician Maxwell Caul, MD Debridement: Debridement Pre-procedure Yes Verification/Time Out Taken: Start Time: 14:19 Pain Control: Lidocaine 4% Topical Solution Level: Skin/Subcutaneous Tissue Total Area Debrided (L x 1.3 (cm) x 1.1 (cm) = 1.43 (cm) W): Tissue and other Viable, Non-Viable, Eschar, Fibrin/Slough, Subcutaneous material debrided: Instrument: Curette Bleeding: Minimum Hemostasis Achieved: Pressure End Time: 14:21 Procedural Pain: 0 Post Procedural Pain: 0 Response to Treatment: Procedure was tolerated well Post Debridement Measurements of Total Wound Length: (cm) 1.3 Width: (cm) 1.1 Depth: (cm) 0.1 Volume: (cm) 0.112 Post Procedure Diagnosis Same as Pre-procedure Electronic Signature(s) Signed: 12/31/2015 4:48:52 PM By: Curtis Sites Signed: 01/01/2016 7:57:27 AM By: Baltazar Najjar MD Entered By: Baltazar Najjar on 12/31/2015 14:37:55 [name], [name] ([id number]) [name], [name] ([id number]) -------------------------------------------------------------------------------- HPI Details Patient Name: [name], [name]. Date of Service: 12/31/2015 1:30 PM Medical Record Patient Account Number: [id number] Number: Treating RN: Dainelle, Hun 28-Feb-1934 (82 y.o. Other Clinician: Date of Birth/Sex: Female) Treating Lanorris Kalisz Primary Care Physician/Extender: Rex Kras, Heidi Physician: Referring Physician: Charolotte Capuchin in Treatment: 2 History of Present Illness Location: several wounds on the right lower extremity including her right healed right posterior ankle and right lower third of the leg. She also has wounds on her left lower quadrant of the abdomen and the suprapubic area. Quality: Patient reports experiencing a sharp pain to affected  area(s). Severity: Patient states wound are getting worse. Duration: Patient has had the wound for > 3 months prior to seeking treatment at the wound center Timing: Pain in wound is Intermittent (comes and goes Context: The wound appeared gradually over time Modifying Factors: Other treatment(s) tried include:he simply been admitted to the hospital 2 weeks ago and has had procedures done on her right lower extremity and had a blockage which we are trying to get some notes Associated Signs and Symptoms: Patient reports having increase discharge. HPI Description: 82 year old patient who is known to be diabetic, was referred to [place] by Dr. Gavin Potters for a right heel ulceration which she's had for a while. She was recently in hospital for a pneumonia and at that time and got delirious and was disoriented and sometime during this time developed a stage II ulcer on her right heel. Her past medical history is significant for bilateral pneumonia which was treated with injectable antibiotics and then to oral Levaquin which he has completed. She also has acute on chronic diastolic CHF, acute on chronic respiratory failure, end-stage renal disease on hemodialysis, atrial fibrillation, recent stroke, diabetes mellitus. The patient and her son are poor historians but from what I understand  she was admitted to the hospital with an acute vascular compromise of her right lower extremity and Dr. Wyn Quaker has done a surgical procedure and we are trying to obtain these notes. There are also some vascular workup done and we will try and obtain these notes. the injury to the left lower quadrant of abdomen and the suprapubic area have been there due to a bruise and have been there for several months and no intervention has been done. 10/11/2015 -- on review of the electronics records it was noted that the patient was admitted to the hospital on 09/14/2015 with peripheral vascular disease with claudication, end-stage renal  disease, pressure ulcer, chronic atrial fibrillation. She was seen by Dr. Wyn Quaker who did her right lower extremity angiogram , angioplasty of the right anterior tibial artery and thrombolysis with TPA of the right popliteal artery, and thrombectomy. She was seen by Dr. Wyn Quaker during this past week and he was pleased with the progress. He did say that if he took her to the operating room for any procedure he would debride the abdominal wound under anesthesia. She was also seen by Dr. Ether Griffins the podiatrist who thought that she may lose her right fourth toe at some stage may need an amputation of this. [name], [name] ([id number]) 10/21/2015 --patient known to Dr. Wyn Quaker and his last office visit from 10/04/2015 has been reviewed. 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 She also had a persistent ulceration of the right heel and markedly this area and a right third and fourth toe and a small scab on the calf but these are dry and seemed to be improving. Patient also has a left carotid endarterectomy and multiple interventions to a right brachiocephalic AV fistula. After the visit he had recommended noninvasive studies to recheck her revascularization. He was off the impression that she would likely lose the right fourth toe and the third toe was likely to heal. He was concerned about underlying muscle necrosis on her right heel and midfoot. 11/01/2015 -- an echo done in January of this year showed her left ventricular ejection fraction to be about 50-55%. The patient was seen by the PA and Dr. Driscilla Grammes office and the plan was to take her to the operating room soon to have a debridement under anesthesia for the abdominal wall wound, the Achilles tendon on the right leg and amputation of the right fourth toe. The daughter and the patient do not feel that they would be able to undergo  hyperbaric oxygen therapy 5 days a week for 6 weeks. 12/17/15; this is a medically complex woman who I note was recently in this clinic however I was not involved with her care. She returns today with multiple wounds; a) she has a wound in the mid abdomen that is been there since March of this year. I note that she is been to the overall for debridement recently. The exact etiology of this wound is not really clear b) left lower quadrant abdominal wound had some sanguinous drainage when she came in here. The patient fell in January and thinks this may have been secondary to a hematoma. c): The patient has 3 wounds on her right leg including a small wound on the right mid calf, a large area over the Achilles which currently has a wound VAC for the last 6 weeks, also a smaller wound on the distal part of the right  heel. As far as I understand most of these wounds are currently been dressed with's calcium alginate. According to her daughter the Achilles wound under the wound VAC is doing well d) the patient is had an amputation of her left fifth toe in January and the right fourth toe 6 weeks ago secondary to diabetic PAD e) the patient has chronic renal failure on dialysis for the last 2 years secondary to type 2 diabetes on insulin. The daughter's knowledge there is been no biopsy of the abdominal wounds given their current appearance and lack of undefined etiology at have to wonder about calciphylaxis. 12/18/15:Addendum; I [facility] I can see no relevant x-rays of the right heel. I note her arteriogram and revascularization of her right lower extremity in April 2017. She had debridement of both abdominal wounds and the right heel and Achilles wound on 11/07/15 at which time she had a right fourth toe ray amputation. The abdominal wounds were debridement again on 6/29. I do not see any relevant pathology of these abdominal wounds 12/24/15; culture I did of the drainage from the  midline abdominal wound last week showed both Proteus and ampicillin sensitive enterococcus. I've given her a course of Augmentin adjusted on dialysis days. She has no specific complaints today. Been using Santyl to the abdominal wounds in the right leg wound and the wound VAC on the right Achilles which was initially prescribed by Dr. dew 12/31/15; I have done two punch biopsies of the large midline abdominal. 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 May have been a trauma component of the one on the left lower quadrant however the midline wound had no such history. She has a large area on the right Achilles heel with a wound VAC prescribed by Dr. dew. A small wound on the right anterior leg.Marland Kitchen UNFORTUNATELY she has 2 new wounds today. One on the left heel which is probably a pressure area. As well her previous amputation site of her right fourth toe has dehisced and now has a small wound with significant depth at the amputation site. [name], [name] ([id number]) Electronic Signature(s) Signed: 01/01/2016 7:57:27 AM By: Baltazar Najjar MD Entered By: Baltazar Najjar on 12/31/2015 14:42:00 [name], [name] ([id number]) -------------------------------------------------------------------------------- Physical Exam Details Patient Name: SAROYA, RICCOBONO Date of Service: 12/31/2015 1:30 PM Medical Record Patient Account Number: [id number] Number: Treating RN: Lajoya, Dombek 03-09-1934 (82 y.o. Other Clinician: Date of Birth/Sex: Female) Treating Marye Eagen Primary Care Physician/Extender: Rex Kras, Heidi Physician: Referring Physician: Charolotte Capuchin in Treatment: 2 Notes Wound exam; firstly on the abdomen there are 2 open wounds here. One in the left lower quadrant and one in the mid abdomen. Both of these are substantial wounds however the wound base appears to be healthier. I did 2 punch biopsies of the midline wound. Small wound on the right anterior leg looks to be healthy.  The Achilles area has much healthier granulation currently receiving collagen under a VAC. The 2 new wounds are in the right fourth toe amputation site and the plantar left heel. Both of these debrided of a non-viable subcutaneous tissue. Electronic Signature(s) Signed: 01/01/2016 7:57:27 AM By: Baltazar Najjar MD Entered By: Baltazar Najjar on 12/31/2015 14:44:11 [name] ([id number]) -------------------------------------------------------------------------------- Physician Orders Details Patient Name: MIKINZIE, MACIEJEWSKI. Date of Service: 12/31/2015 1:30 PM Medical Record Patient Account Number: [id number] Number: Treating RN: Aleiya, Rye 11/12/1933 (82 y.o. Other Clinician: Date of Birth/Sex: Female) Treating Kyrese Gartman Primary Care Physician/Extender: Melene Plan Physician:  Referring Physician: Charolotte Capuchin in Treatment: 2 Verbal / Phone Orders: Yes Clinician: Curtis Sites Read Back and Verified: Yes Diagnosis Coding Wound Cleansing Wound #10 Right Calcaneus o Clean wound with Normal Saline. Wound #11 Midline Abdomen - Lower Quadrant o Clean wound with Normal Saline. Wound #12 Left Abdomen - Lower Quadrant o Clean wound with Normal Saline. Wound #13 Left Calcaneus o Clean wound with Normal Saline. Wound #14 Left Amputation Site - Digit o Clean wound with Normal Saline. Wound #8 Right,Lateral Lower Leg o Clean wound with Normal Saline. Wound #9 Right Achilles o Clean wound with Normal Saline. Anesthetic Wound #10 Right Calcaneus o Topical Lidocaine 4% cream applied to wound bed prior to debridement - in Wound Clinic Wound #11 Midline Abdomen - Lower Quadrant o Topical Lidocaine 4% cream applied to wound bed prior to debridement - in Wound Clinic Wound #12 Left Abdomen - Lower Quadrant o Topical Lidocaine 4% cream applied to wound bed prior to debridement - in Wound Clinic Wound #13 Left Calcaneus o Topical  Lidocaine 4% cream applied to wound bed prior to debridement - in Wound Clinic TAHARA, RUFFINI ([id number]) Wound #14 Left Amputation Site - Digit o Topical Lidocaine 4% cream applied to wound bed prior to debridement - in Wound Clinic Wound #8 Right,Lateral Lower Leg o Topical Lidocaine 4% cream applied to wound bed prior to debridement - in Wound Clinic Wound #9 Right Achilles o Topical Lidocaine 4% cream applied to wound bed prior to debridement - in Wound Clinic Skin Barriers/Peri-Wound Care Wound #10 Right Calcaneus o Skin Prep Wound #11 Midline Abdomen - Lower Quadrant o Skin Prep Wound #12 Left Abdomen - Lower Quadrant o Skin Prep Wound #13 Left Calcaneus o Skin Prep Wound #14 Left Amputation Site - Digit o Skin Prep Wound #8 Right,Lateral Lower Leg o Skin Prep Wound #9 Right Achilles o Skin Prep Primary Wound Dressing Wound #10 Right Calcaneus o Prisma Ag Wound #13 Left Calcaneus o Prisma Ag Wound #14 Left Amputation Site - Digit o Prisma Ag Wound #8 Right,Lateral Lower Leg o Prisma Ag Wound #9 Right Achilles o Prisma Ag Wound #11 Midline Abdomen - Lower TOYE, ROUILLARD ([id number]) o Santyl Ointment Wound #12 Left Abdomen - Lower Quadrant o Santyl Ointment Secondary Dressing Wound #10 Right Calcaneus o ABD pad o Dry Gauze - secure with tape Wound #11 Midline Abdomen - Lower Quadrant o ABD pad o Dry Gauze - secure with tape Wound #12 Left Abdomen - Lower Quadrant o ABD pad o Dry Gauze - secure with tape Wound #13 Left Calcaneus o ABD pad o Dry Gauze - secure with tape Wound #14 Left Amputation Site - Digit o ABD pad o Dry Gauze - secure with tape Wound #8 Right,Lateral Lower Leg o ABD pad o Dry Gauze - secure with tape Wound #9 Right Achilles o ABD pad o Dry Gauze - secure with tape Dressing Change Frequency Wound #10 Right Calcaneus o Change Dressing Monday, Wednesday,  Friday - or as needed Wound #11 Midline Abdomen - Lower Quadrant o Change Dressing Monday, Wednesday, Friday - or as needed Wound #12 Left Abdomen - Lower Quadrant o Change Dressing Monday, Wednesday, Friday - or as needed Wound #13 Left Calcaneus o Change Dressing Monday, Wednesday, Friday - or as needed Wound #14 Left Amputation Site - Digit MOSETTA, FERDINAND ([id number]) o Change Dressing Monday, Wednesday, Friday - or as needed Wound #8 Right,Lateral Lower Leg o Change Dressing Monday, Wednesday, Friday - or as  needed Wound #9 Right Achilles o Change Dressing Monday, Wednesday, Friday - or as needed Follow-up Appointments Wound #10 Right Calcaneus o Return Appointment in 1 week. Wound #11 Midline Abdomen - Lower Quadrant o Return Appointment in 1 week. Wound #12 Left Abdomen - Lower Quadrant o Return Appointment in 1 week. Wound #13 Left Calcaneus o Return Appointment in 1 week. Wound #14 Left Amputation Site - Digit o Return Appointment in 1 week. Wound #8 Right,Lateral Lower Leg o Return Appointment in 1 week. Wound #9 Right Achilles o Return Appointment in 1 week. Home Health Wound #10 Right Calcaneus o Continue Home Health Visits - [facility] to order appropriate wound care supplies for patient o Home Health Nurse may visit [facility] to address patientos wound care needs. o FACE TO FACE ENCOUNTER: MEDICARE and MEDICAID PATIENTS: I certify that this patient is under my care and that I had a face-to-face encounter that meets the physician face-to-face encounter requirements with this patient on this date. The encounter with the patient was in whole or in part for the following MEDICAL CONDITION: (primary reason for Home Healthcare) MEDICAL NECESSITY: I certify, that based on my findings, NURSING services are a medically necessary home health service. HOME BOUND STATUS: I certify that my clinical findings support that this patient is  homebound (i.e., Due to illness or injury, pt requires aid of supportive devices such as crutches, cane, wheelchairs, walkers, the use of special transportation or the assistance of another person to leave their place of residence. There is a normal inability to leave the home and doing so requires considerable and taxing effort. Other absences are for medical reasons / religious services and are infrequent or of short duration when for other reasons). LUN, MURO ([id number]) o If current dressing causes regression in wound condition, may D/C ordered dressing product/s and apply Normal Saline Moist Dressing daily until next Wound Healing Center / Other MD appointment. Notify Wound Healing Center of regression in wound condition at [phone]. o Please direct any NON-WOUND related issues/requests for orders to patient's Primary Care Physician Wound #11 Midline Abdomen - Lower Quadrant o Continue Home Health Visits - [facility] to order appropriate wound care supplies for patient o Home Health Nurse may visit PRN to address patientos wound care needs. o FACE TO FACE ENCOUNTER: MEDICARE and MEDICAID PATIENTS: I certify that this patient is under my care and that I had a face-to-face encounter that meets the physician face-to-face encounter requirements with this patient on this date. The encounter with the patient was in whole or in part for the following MEDICAL CONDITION: (primary reason for Home Healthcare) MEDICAL NECESSITY: I certify, that based on my findings, NURSING services are a medically necessary home health service. HOME BOUND STATUS: I certify that my clinical findings support that this patient is homebound (i.e., Due to illness or injury, pt requires aid of supportive devices such as crutches, cane, wheelchairs, walkers, the use of special transportation or the assistance of another person to leave their place of residence. There is a normal inability to  leave the home and doing so requires considerable and taxing effort. Other absences are for medical reasons / religious services and are infrequent or of short duration when for other reasons). o If current dressing causes regression in wound condition, may D/C ordered dressing product/s and apply Normal Saline Moist Dressing daily until next Wound Healing Center / Other MD appointment. Notify Wound Healing Center of regression in wound condition at [phone].   o Please direct any NON-WOUND related issues/requests for orders to patient's Primary Care Physician Wound #12 Left Abdomen - Lower Quadrant o Continue Home Health Visits - [facility] to order appropriate wound care supplies for patient o Home Health Nurse may visit PRN to address patientos wound care needs. o FACE TO FACE ENCOUNTER: MEDICARE and MEDICAID PATIENTS: I certify that this patient is under my care and that I had a face-to-face encounter that meets the physician face-to-face encounter requirements with this patient on this date. The encounter with the patient was in whole or in part for the following MEDICAL CONDITION: (primary reason for Home Healthcare) MEDICAL NECESSITY: I certify, that based on my findings, NURSING services are a medically necessary home health service. HOME BOUND STATUS: I certify that my clinical findings support that this patient is homebound (i.e., Due to illness or injury, pt requires aid of supportive devices such as crutches, cane, wheelchairs, walkers, the use of special transportation or the assistance of another person to leave their place of residence. There is a normal inability to leave the home and doing so requires considerable and taxing effort. Other absences are for medical reasons / religious services and are infrequent or of short duration when for other reasons). o If current dressing causes regression in wound condition, may D/C ordered dressing product/s and  apply Normal Saline Moist Dressing daily until next Wound Healing Center / Other MD appointment. Notify Wound Healing Center of regression in wound condition at [phone]. o Please direct any NON-WOUND related issues/requests for orders to patient's Primary Care Physician LATAYA, VARNELL ([id number]) Wound #13 Left Calcaneus o [facility] - [facility] to order appropriate wound care supplies for patient o Home Health Nurse may visit PRN to address patientos wound care needs. o FACE TO FACE ENCOUNTER: MEDICARE and MEDICAID PATIENTS: I certify that this patient is under my care and that I had a face-to-face encounter that meets the physician face-to-face encounter requirements with this patient on this date. The encounter with the patient was in whole or in part for the following MEDICAL CONDITION: (primary reason for Home Healthcare) MEDICAL NECESSITY: I certify, that based on my findings, NURSING services are a medically necessary home health service. HOME BOUND STATUS: I certify that my clinical findings support that this patient is homebound (i.e., Due to illness or injury, pt requires aid of supportive devices such as crutches, cane, wheelchairs, walkers, the use of special transportation or the assistance of another person to leave their place of residence. There is a normal inability to leave the home and doing so requires considerable and taxing effort. Other absences are for medical reasons / religious services and are infrequent or of short duration when for other reasons). o If current dressing causes regression in wound condition, may D/C ordered dressing product/s and apply Normal Saline Moist Dressing daily until next Wound Healing Center / Other MD appointment. Notify Wound Healing Center of regression in wound condition at [phone]. o Please direct any NON-WOUND related issues/requests for orders to patient's Primary  Care Physician Wound #14 Left Amputation Site - Digit o Continue [facility] Visits - [facility] to order appropriate wound care supplies for patient o Home Health Nurse may visit PRN to address patientos wound care needs. o FACE TO FACE ENCOUNTER: MEDICARE and MEDICAID PATIENTS: I certify that this patient is under my care and that I had a face-to-face encounter that meets the physician face-to-face encounter requirements with this  patient on this date. The encounter with the patient was in whole or in part for the following MEDICAL CONDITION: (primary reason for Home Healthcare) MEDICAL NECESSITY: I certify, that based on my findings, NURSING services are a medically necessary home health service. HOME BOUND STATUS: I certify that my clinical findings support that this patient is homebound (i.e., Due to illness or injury, pt requires aid of supportive devices such as crutches, cane, wheelchairs, walkers, the use of special transportation or the assistance of another person to leave their place of residence. There is a normal inability to leave the home and doing so requires considerable and taxing effort. Other absences are for medical reasons / religious services and are infrequent or of short duration when for other reasons). o If current dressing causes regression in wound condition, may D/C ordered dressing product/s and apply Normal Saline Moist Dressing daily until next Wound Healing Center / Other MD appointment. Notify Wound Healing Center of regression in wound condition at [phone]. o Please direct any NON-WOUND related issues/requests for orders to patient's Primary Care Physician Wound #8 Right,Lateral Lower Leg o [facility] Visits - [facility] to order appropriate wound care supplies for patient o Home Health Nurse may visit PRN to address patientos wound care needs. TEKOA, HAMOR ([id number]) o FACE TO FACE ENCOUNTER: MEDICARE and  MEDICAID PATIENTS: I certify that this patient is under my care and that I had a face-to-face encounter that meets the physician face-to-face encounter requirements with this patient on this date. The encounter with the patient was in whole or in part for the following MEDICAL CONDITION: (primary reason for Home Healthcare) MEDICAL NECESSITY: I certify, that based on my findings, NURSING services are a medically necessary home health service. HOME BOUND STATUS: I certify that my clinical findings support that this patient is homebound (i.e., Due to illness or injury, pt requires aid of supportive devices such as crutches, cane, wheelchairs, walkers, the use of special transportation or the assistance of another person to leave their place of residence. There is a normal inability to leave the home and doing so requires considerable and taxing effort. Other absences are for medical reasons / religious services and are infrequent or of short duration when for other reasons). o If current dressing causes regression in wound condition, may D/C ordered dressing product/s and apply Normal Saline Moist Dressing daily until next Wound Healing Center / Other MD appointment. Notify Wound Healing Center of regression in wound condition at [phone]. o Please direct any NON-WOUND related issues/requests for orders to patient's Primary Care Physician Wound #9 Right Achilles o [facility] Visits - [facility] to order appropriate wound care supplies for patient o Home Health Nurse may visit PRN to address patientos wound care needs. o FACE TO FACE ENCOUNTER: MEDICARE and MEDICAID PATIENTS: I certify that this patient is under my care and that I had a face-to-face encounter that meets the physician face-to-face encounter requirements with this patient on this date. The encounter with the patient was in whole or in part for the following MEDICAL CONDITION: (primary reason for Home  Healthcare) MEDICAL NECESSITY: I certify, that based on my findings, NURSING services are a medically necessary home health service. HOME BOUND STATUS: I certify that my clinical findings support that this patient is homebound (i.e., Due to illness or injury, pt requires aid of supportive devices such as crutches, cane, wheelchairs, walkers, the use of special transportation or the assistance of another person  to leave their place of residence. There is a normal inability to leave the home and doing so requires considerable and taxing effort. Other absences are for medical reasons / religious services and are infrequent or of short duration when for other reasons). o If current dressing causes regression in wound condition, may D/C ordered dressing product/s and apply Normal Saline Moist Dressing daily until next Wound Healing Center / Other MD appointment. Notify Wound Healing Center of regression in wound condition at [phone]. o Please direct any NON-WOUND related issues/requests for orders to patient's Primary Care Physician Negative Pressure Wound Therapy Wound #9 Right Achilles o Wound VAC settings at 125/130 mmHg continuous pressure. Use BLACK/GREEN foam to wound cavity. Use WHITE foam to fill any tunnel/s and/or undermining. Change VAC dressing 3 X WEEK. Change canister as indicated when full. Nurse may titrate settings and frequency of dressing changes as clinically indicated. Laboratory DANETT, PALAZZO ([id number]) o Tissue Pathology biopsy report (PATH) - midline abdomen oooo LOINC Code: 36644-0 oooo Convenience Name: Tiss Path Bx report Electronic Signature(s) Signed: 12/31/2015 4:48:52 PM By: Curtis Sites Signed: 01/01/2016 7:57:27 AM By: Baltazar Najjar MD Entered By: Curtis Sites on 12/31/2015 14:35:10 [name] ([id number]) -------------------------------------------------------------------------------- Problem List Details Patient Name: DONICA, DEROUIN. Date of Service: 12/31/2015 1:30 PM Medical Record Patient Account Number: [id number] Number: Treating RN: Delesa, Kawa [birth year] (82 y.o. Other Clinician: Date of Birth/Sex: Female) Treating Glanda Spanbauer Primary Care Physician/Extender: Rex Kras, Heidi Physician: Referring Physician: Charolotte Capuchin in Treatment: 2 Active Problems ICD-10 Encounter Code Description Active Date Diagnosis E11.621 Type 2 diabetes mellitus with foot ulcer 12/17/2015 Yes S31.104A Unspecified open wound of abdominal wall, left lower 12/17/2015 Yes quadrant without penetration into peritoneal cavity, initial encounter E11.51 Type 2 diabetes mellitus with diabetic peripheral 12/17/2015 Yes angiopathy without gangrene Inactive Problems Resolved Problems Electronic Signature(s) Signed: 01/01/2016 7:57:27 AM By: Baltazar Najjar MD Entered By: Baltazar Najjar on 12/31/2015 14:36:25 [name] ([id number]) -------------------------------------------------------------------------------- Progress Note Details Patient Name: [name] Date of Service: 12/31/2015 1:30 PM Medical Record Patient Account Number: [id number] Number: Treating RN: Damyah, Gugel 08/15/1933 (82 y.o. Other Clinician: Date of Birth/Sex: Female) Treating Janasha Barkalow Primary Care Physician/Extender: Rex Kras, Heidi Physician: Referring Physician: Charolotte Capuchin in Treatment: 2 Subjective Chief Complaint Information obtained from Patient Patients presents for treatment of an open diabetic ulcer, arterial ulcer and pressure ulcers to the right lower extremities which is a complex etiology for the last several months. He also has ulcers on her abdomen in the left lower quadrant and left suprapubic area which she's had for about 4 months. 12/17/15; patient returns to clinic apparently referred back from vascular surgery for ongoing wound care. History of Present Illness  (HPI) The following HPI elements were documented for the patient's wound: Location: several wounds on the right lower extremity including her right healed right posterior ankle and right lower third of the leg. She also has wounds on her left lower quadrant of the abdomen and the suprapubic area. Quality: Patient reports experiencing a sharp pain to affected area(s). Severity: Patient states wound are getting worse. Duration: Patient has had the wound for > 3 months prior to seeking treatment at the wound center Timing: Pain in wound is Intermittent (comes and goes Context: The wound appeared gradually over time Modifying Factors: Other treatment(s) tried include:he simply been admitted to the hospital 2 weeks ago and has had procedures done on her right lower extremity and had a blockage  which we are trying to get some notes Associated Signs and Symptoms: Patient reports having increase discharge. 82 year old patient who is known to be diabetic, was referred to [place] by Dr. Gavin Potters for a right heel ulceration which she's had for a while. She was recently in hospital for a pneumonia and at that time and got delirious and was disoriented and sometime during this time developed a stage II ulcer on her right heel. Her past medical history is significant for bilateral pneumonia which was treated with injectable antibiotics and then to oral Levaquin which he has completed. She also has acute on chronic diastolic CHF, acute on chronic respiratory failure, end-stage renal disease on hemodialysis, atrial fibrillation, recent stroke, diabetes mellitus. The patient and her son are poor historians but from what I understand she was admitted to the hospital with an acute vascular compromise of her right lower extremity and Dr. Wyn Quaker has done a surgical procedure and we are trying to obtain these notes. There are also some vascular workup done and we will try and obtain these notes. the injury to the left  lower quadrant of abdomen and the suprapubic area have been there due to a bruise and have been there for several months and no intervention has been done. KENIDY, CROSSLAND ([id number]) 10/11/2015 -- on review of the electronics records it was noted that the patient was admitted to the hospital on 09/14/2015 with peripheral vascular disease with claudication, end-stage renal disease, pressure ulcer, chronic atrial fibrillation. She was seen by Dr. Wyn Quaker who did her right lower extremity angiogram , angioplasty of the right anterior tibial artery and thrombolysis with TPA of the right popliteal artery, and thrombectomy. She was seen by Dr. Wyn Quaker during this past week and he was pleased with the progress. He did say that if he took her to the operating room for any procedure he would debride the abdominal wound under anesthesia. She was also seen by Dr. Ether Griffins the podiatrist who thought that she may lose her right fourth toe at some stage may need an amputation of this. 10/21/2015 --patient known to Dr. Wyn Quaker and his last office visit from 10/04/2015 has been reviewed. 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 She also had a persistent ulceration of the right heel and markedly this area and a right third and fourth toe and a small scab on the calf but these are dry and seemed to be improving. Patient also has a left carotid endarterectomy and multiple interventions to a right brachiocephalic AV fistula. After the visit he had recommended noninvasive studies to recheck her revascularization. He was off the impression that she would likely lose the right fourth toe and the third toe was likely to heal. He was concerned about underlying muscle necrosis on her right heel and midfoot. 11/01/2015 -- an echo done in January of this year showed her left ventricular ejection fraction to be  about 50-55%. The patient was seen by the PA and Dr. Driscilla Grammes office and the plan was to take her to the operating room soon to have a debridement under anesthesia for the abdominal wall wound, the Achilles tendon on the right leg and amputation of the right fourth toe. The daughter and the patient do not feel that they would be able to undergo hyperbaric oxygen therapy 5 days a week for 6 weeks. 12/17/15; this is a medically complex woman who I note  was recently in this clinic however I was not involved with her care. She returns today with multiple wounds; a) she has a wound in the mid abdomen that is been there since March of this year. I note that she is been to the overall for debridement recently. The exact etiology of this wound is not really clear b) left lower quadrant abdominal wound had some sanguinous drainage when she came in here. The patient fell in January and thinks this may have been secondary to a hematoma. c): The patient has 3 wounds on her right leg including a small wound on the right mid calf, a large area over the Achilles which currently has a wound VAC for the last 6 weeks, also a smaller wound on the distal part of the right heel. As far as I understand most of these wounds are currently been dressed with's calcium alginate. According to her daughter the Achilles wound under the wound VAC is doing well d) the patient is had an amputation of her left fifth toe in January and the right fourth toe 6 weeks ago secondary to diabetic PAD e) the patient has chronic renal failure on dialysis for the last 2 years secondary to type 2 diabetes on insulin. The daughter's knowledge there is been no biopsy of the abdominal wounds given their current appearance and lack of undefined etiology at have to wonder about calciphylaxis. 12/18/15:Addendum; I have reviewed [facility] I can see no relevant x-rays of the right heel. I note her arteriogram and revascularization of her right  lower extremity in April 2017. She had debridement of both abdominal wounds and the right heel and Achilles wound on 11/07/15 at which time she had a right fourth toe ray amputation. The abdominal wounds were debridement again on 6/29. I do not see any relevant pathology of these abdominal wounds TANAYIA, WAHLQUIST ([id number]) 12/24/15; culture I did of the drainage from the midline abdominal wound last week showed both Proteus and ampicillin sensitive enterococcus. I've given her a course of Augmentin adjusted on dialysis days. She has no specific complaints today. Been using Santyl to the abdominal wounds in the right leg wound and the wound VAC on the right Achilles which was initially prescribed by Dr. dew 12/31/15; I have done two punch biopsies of the large midline abdominal. 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 May have been a trauma component of the one on the left lower quadrant however the midline wound had no such history. She has a large area on the right Achilles heel with a wound VAC prescribed by Dr. dew. A small wound on the right anterior leg.Marland Kitchen UNFORTUNATELY she has 2 new wounds today. One on the left heel which is probably a pressure area. As well her previous amputation site of her right fourth toe has dehisced and now has a small wound with significant depth at the amputation site. Objective Constitutional Vitals Time Taken: 1:31 PM, Height: 65 in, Weight: 160 lbs, BMI: 26.6, Temperature: 98.2 F, Pulse: 69 bpm, Respiratory Rate: 18 breaths/min, Blood Pressure: 113/43 mmHg. Integumentary (Hair, Skin) Wound #10 status is Open. Original cause of wound was Gradually Appeared. The wound is located on the Right Calcaneus. The wound measures 1.6cm length x 1.6cm width x 0.1cm depth; 2.011cm^2 area and 0.201cm^3 volume. The wound is limited to skin breakdown. There is no tunneling or undermining noted. There is a medium amount of serous drainage noted. The wound margin is flat and intact.  There is small (1-33%) pink granulation  within the wound bed. There is a large (67-100%) amount of necrotic tissue within the wound bed including Eschar and Adherent Slough. The periwound skin appearance exhibited: Maceration, Moist, Erythema. The periwound skin appearance did not exhibit: Callus, Crepitus, Excoriation, Fluctuance, Friable, Induration, Localized Edema, Rash, Scarring, Dry/Scaly, Atrophie Blanche, Cyanosis, Ecchymosis, Hemosiderin Staining, Mottled, Pallor, Rubor. The surrounding wound skin color is noted with erythema which is circumferential. Periwound temperature was noted as No Abnormality. The periwound has tenderness on palpation. Wound #11 status is Open. Original cause of wound was Trauma. The wound is located on the Midline Abdomen - Lower Quadrant. The wound measures 4.3cm length x 8.2cm width x 1.5cm depth; 27.693cm^2 area and 41.54cm^3 volume. The wound is limited to skin breakdown. There is no tunneling or undermining noted. There is a large amount of serous drainage noted. The wound margin is flat and intact. There is large (67-100%) red granulation within the wound bed. There is a small (1-33%) amount of necrotic tissue within the wound bed including Eschar and Adherent Slough. The periwound skin appearance exhibited: Moist, Erythema. The periwound skin appearance did not exhibit: Callus, Crepitus, Excoriation, Fluctuance, Friable, Induration, Localized Edema, Rash, Scarring, Dry/Scaly, Maceration, Atrophie Blanche, Cyanosis, Ecchymosis, Hemosiderin Staining, Mottled, Pallor, Rubor. The surrounding wound skin color is noted with erythema which is circumferential. Periwound temperature was noted as No Abnormality. The periwound has tenderness on palpation. Wound #12 status is Open. Original cause of wound was Trauma. The wound is located on the Left Abdomen - Lower Quadrant. The wound measures 1.2cm length x 9.1cm width x 1.6cm depth; 8.577cm^2 [name], [name].  ([id number]) area and 13.722cm^3 volume. The wound is limited to skin breakdown. There is no tunneling or undermining noted. There is a large amount of serous drainage noted. The wound margin is flat and intact. There is large (67-100%) red granulation within the wound bed. There is a small (1-33%) amount of necrotic tissue within the wound bed including Eschar and Adherent Slough. The periwound skin appearance exhibited: Moist, Erythema. The periwound skin appearance did not exhibit: Callus, Crepitus, Excoriation, Fluctuance, Friable, Induration, Localized Edema, Rash, Scarring, Dry/Scaly, Maceration, Atrophie Blanche, Cyanosis, Ecchymosis, Hemosiderin Staining, Mottled, Pallor, Rubor. The surrounding wound skin color is noted with erythema which is circumferential. Periwound temperature was noted as No Abnormality. The periwound has tenderness on palpation. Wound #13 status is Open. Original cause of wound was Gradually Appeared. The wound is located on the Left Calcaneus. The wound measures 1.1cm length x 0.3cm width x 0.3cm depth; 0.259cm^2 area and 0.078cm^3 volume. The wound is limited to skin breakdown. There is no tunneling or undermining noted. There is a large amount of serosanguineous drainage noted. The wound margin is flat and intact. There is no granulation within the wound bed. There is a large (67-100%) amount of necrotic tissue within the wound bed including Eschar and Adherent Slough. The periwound skin appearance exhibited: Moist. The periwound skin appearance did not exhibit: Callus, Crepitus, Excoriation, Fluctuance, Friable, Induration, Localized Edema, Rash, Scarring, Dry/Scaly, Maceration, Atrophie Blanche, Cyanosis, Ecchymosis, Hemosiderin Staining, Mottled, Pallor, Rubor, Erythema. Periwound temperature was noted as No Abnormality. Wound #14 status is Open. Original cause of wound was Surgical Injury. The wound is located on the Left Amputation Site - Digit. The wound  measures 1.2cm length x 0.8cm width x 0.1cm depth; 0.754cm^2 area and 0.075cm^3 volume. The wound is limited to skin breakdown. There is no tunneling or undermining noted. There is a medium amount of serous drainage noted. The wound margin is  flat and intact. There is no granulation within the wound bed. There is a large (67-100%) amount of necrotic tissue within the wound bed including Eschar and Adherent Slough. The periwound skin appearance exhibited: Maceration, Moist. The periwound skin appearance did not exhibit: Callus, Crepitus, Excoriation, Fluctuance, Friable, Induration, Localized Edema, Rash, Scarring, Dry/Scaly, Atrophie Blanche, Cyanosis, Ecchymosis, Hemosiderin Staining, Mottled, Pallor, Rubor, Erythema. Periwound temperature was noted as No Abnormality. Wound #8 status is Open. Original cause of wound was Gradually Appeared. The wound is located on the Right,Lateral Lower Leg. The wound measures 1.3cm length x 1.1cm width x 0.1cm depth; 1.123cm^2 area and 0.112cm^3 volume. The wound is limited to skin breakdown. There is no tunneling or undermining noted. There is a medium amount of serous drainage noted. The wound margin is flat and intact. There is medium (34-66%) red, pink granulation within the wound bed. There is a medium (34-66%) amount of necrotic tissue within the wound bed including Eschar and Adherent Slough. The periwound skin appearance exhibited: Maceration, Moist, Erythema. The periwound skin appearance did not exhibit: Callus, Crepitus, Excoriation, Fluctuance, Friable, Induration, Localized Edema, Rash, Scarring, Dry/Scaly, Atrophie Blanche, Cyanosis, Ecchymosis, Hemosiderin Staining, Mottled, Pallor, Rubor. The surrounding wound skin color is noted with erythema which is circumferential. Periwound temperature was noted as No Abnormality. Wound #9 status is Open. Original cause of wound was Gradually Appeared. The wound is located on the Right Achilles. The wound  measures 8.7cm length x 4.9cm width x 0.3cm depth; 33.482cm^2 area and 10.044cm^3 volume. The wound is limited to skin breakdown. There is no tunneling or undermining noted. The wound margin is flat and intact. There is large (67-100%) red granulation within the wound bed. There is a small (1-33%) amount of necrotic tissue within the wound bed including Eschar and Adherent Slough. The periwound skin appearance exhibited: Maceration, Moist, Erythema. The periwound skin appearance did not exhibit: Callus, Crepitus, Excoriation, Fluctuance, Friable, Induration, Localized Edema, Rash, GERLEAN, CID ([id number]) Scarring, Dry/Scaly, Atrophie Blanche, Cyanosis, Ecchymosis, Hemosiderin Staining, Mottled, Pallor, Rubor. The surrounding wound skin color is noted with erythema which is circumferential. The periwound has tenderness on palpation. 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 There was a Skin/Subcutaneous Tissue Debridement ([id number]) debridement with total area of 0.33 sq cm performed by Maxwell Caul, MD. with the following instrument(s): Curette to remove Viable and Non-Viable tissue/material including Fibrin/Slough, Eschar, and Subcutaneous after achieving pain control using Lidocaine 4% Topical Solution. A time out was conducted prior to the start of the procedure. A Minimum amount of bleeding was controlled with Pressure. The procedure was tolerated well with a pain level of 0 throughout and a pain level of 0 following the procedure. Post Debridement Measurements: 1.1cm length x 0.3cm width x 0.3cm depth; 0.078cm^3  volume. Post procedure Diagnosis Wound #13: Same as Pre-Procedure Wound #14 Wound #14 is an Open Surgical Wound located on the Left Amputation Site - Digit . There was a Skin/Subcutaneous Tissue Debridement ([id number]) debridement with total area of 0.96 sq cm performed by Maxwell Caul, MD. with the following instrument(s): Curette to remove Viable and Non-Viable tissue/material including Fibrin/Slough, Eschar, and Subcutaneous after achieving pain control using Lidocaine 4% Topical Solution. A time out  was conducted prior to the start of the procedure. A Minimum amount of bleeding was controlled with Pressure. The procedure was tolerated well with a pain level of 0 throughout and a pain level of 0 following the procedure. Post Debridement Measurements: 1.2cm length x 0.8cm width x 0.3cm depth; 0.226cm^3 volume. Post procedure Diagnosis Wound #14: Same as Pre-Procedure Wound #8 Wound #8 is an Arterial Insufficiency Ulcer located on the Right,Lateral Lower Leg . There was a MAYUMI, SUMMERSON ([id number]) Skin/Subcutaneous Tissue Debridement [phone]) debridement with total area of 1.43 sq cm performed by Maxwell Caul, MD. with the following instrument(s): Curette to remove Viable and Non-Viable tissue/material including Fibrin/Slough, Eschar, and Subcutaneous after achieving pain control using Lidocaine 4% Topical Solution. A time out was conducted prior to the start of the procedure. A Minimum amount of bleeding was controlled with Pressure. The procedure was tolerated well with a pain level of 0 throughout and a pain level of 0 following the procedure. Post Debridement Measurements: 1.3cm length x 1.1cm width x 0.1cm depth; 0.112cm^3 volume. Post procedure Diagnosis Wound #8: Same as Pre-Procedure Wound #11 Wound #11 is an Arterial Insufficiency Ulcer located on the Midline Abdomen - Lower Quadrant . There was a biopsy performed by Maxwell Caul, MD. There was a biopsy  performed on Wound Margin. The skin was cleansed and prepped with anti-septic followed by pain control using Lidocaine Injectable: 5%. Utilizing a 3 mm tissue punch, tissue was removed at its base with the following instrument(s): Forceps and Other and sent to pathology. A Minimum amount of bleeding was controlled with Silver Nitrate. A time out was conducted prior to the start of the procedure. The procedure was tolerated well with a pain level of 0 throughout and a pain level of 0 following the procedure. Post procedure Diagnosis Wound #11: Same as Pre-Procedure Plan Wound Cleansing: Wound #10 Right Calcaneus: Clean wound with Normal Saline. Wound #11 Midline Abdomen - Lower Quadrant: Clean wound with Normal Saline. Wound #12 Left Abdomen - Lower Quadrant: Clean wound with Normal Saline. Wound #13 Left Calcaneus: Clean wound with Normal Saline. Wound #14 Left Amputation Site - Digit: Clean wound with Normal Saline. Wound #8 Right,Lateral Lower Leg: Clean wound with Normal Saline. Wound #9 Right Achilles: Clean wound with Normal Saline. Anesthetic: Wound #10 Right Calcaneus: Topical Lidocaine 4% cream applied to wound bed prior to debridement - in Wound Clinic Wound #11 Midline Abdomen - Lower Quadrant: Topical Lidocaine 4% cream applied to wound bed prior to debridement - in Wound Clinic Wound #12 Left Abdomen - Lower Quadrant: Topical Lidocaine 4% cream applied to wound bed prior to debridement - in Wound Clinic Wound #13 Left Calcaneus: ROYALTI, SCHAUF ([id number]) Topical Lidocaine 4% cream applied to wound bed prior to debridement - in Wound Clinic Wound #14 Left Amputation Site - Digit: Topical Lidocaine 4% cream applied to wound bed prior to debridement - in Wound Clinic Wound #8 Right,Lateral Lower Leg: Topical Lidocaine 4% cream applied to wound bed prior to debridement - in Wound Clinic Wound #9 Right Achilles: Topical Lidocaine 4% cream applied to wound bed prior to  debridement - in Wound Clinic Skin Barriers/Peri-Wound Care: Wound #10 Right Calcaneus: Skin Prep Wound #11 Midline Abdomen - Lower Quadrant: Skin Prep Wound #12 Left Abdomen - Lower Quadrant: Skin Prep Wound #13 Left Calcaneus: Skin Prep Wound #14 Left Amputation Site - Digit: Skin Prep Wound #8 Right,Lateral Lower Leg: Skin Prep Wound #9 Right Achilles: Skin Prep Primary Wound Dressing: Wound #  10 Right Calcaneus: Prisma Ag Wound #13 Left Calcaneus: Prisma Ag Wound #14 Left Amputation Site - Digit: Prisma Ag Wound #8 Right,Lateral Lower Leg: Prisma Ag Wound #9 Right Achilles: Prisma Ag Wound #11 Midline Abdomen - Lower Quadrant: Santyl Ointment Wound #12 Left Abdomen - Lower Quadrant: Santyl Ointment Secondary Dressing: Wound #10 Right Calcaneus: ABD pad Dry Gauze - secure with tape Wound #11 Midline Abdomen - Lower Quadrant: ABD pad Dry Gauze - secure with tape Wound #12 Left Abdomen - Lower Quadrant: ABD pad Dry Gauze - secure with tape Wound #13 Left Calcaneus: ABD pad Dry Gauze - secure with tape Wound #14 Left Amputation Site - DigitZHANAE, BARGERON ([id number]) ABD pad Dry Gauze - secure with tape Wound #8 Right,Lateral Lower Leg: ABD pad Dry Gauze - secure with tape Wound #9 Right Achilles: ABD pad Dry Gauze - secure with tape Dressing Change Frequency: Wound #10 Right Calcaneus: Change Dressing Monday, Wednesday, Friday - or as needed Wound #11 Midline Abdomen - Lower Quadrant: Change Dressing Monday, Wednesday, Friday - or as needed Wound #12 Left Abdomen - Lower Quadrant: Change Dressing Monday, Wednesday, Friday - or as needed Wound #13 Left Calcaneus: Change Dressing Monday, Wednesday, Friday - or as needed Wound #14 Left Amputation Site - Digit: Change Dressing Monday, Wednesday, Friday - or as needed Wound #8 Right,Lateral Lower Leg: Change Dressing Monday, Wednesday, Friday - or as needed Wound #9 Right Achilles: Change Dressing  Monday, Wednesday, Friday - or as needed Follow-up Appointments: Wound #10 Right Calcaneus: Return Appointment in 1 week. Wound #11 Midline Abdomen - Lower Quadrant: Return Appointment in 1 week. Wound #12 Left Abdomen - Lower Quadrant: Return Appointment in 1 week. Wound #13 Left Calcaneus: Return Appointment in 1 week. Wound #14 Left Amputation Site - Digit: Return Appointment in 1 week. Wound #8 Right,Lateral Lower Leg: Return Appointment in 1 week. Wound #9 Right Achilles: Return Appointment in 1 week. Home Health: Wound #10 Right Calcaneus: [facility] to order appropriate wound care supplies for patient Home Health Nurse may visit PRN to address patient s wound care needs. FACE TO FACE ENCOUNTER: MEDICARE and MEDICAID PATIENTS: I certify that this patient is under my care and that I had a face-to-face encounter that meets the physician face-to-face encounter requirements with this patient on this date. The encounter with the patient was in whole or in part for the following MEDICAL CONDITION: (primary reason for Home Healthcare) MEDICAL NECESSITY: I certify, that based on my findings, NURSING services are a medically necessary home health service. HOME BOUND STATUS: I certify that my clinical findings support that this patient is homebound (i.e., Due to illness or injury, pt requires aid of supportive devices such as crutches, cane, wheelchairs, walkers, the use of special transportation or the assistance of another person to leave their place of residence. There is a normal inability to leave the home and doing so requires considerable and taxing effort. Other absences are JENIN, BONTON ([id number]) for medical reasons / religious services and are infrequent or of short duration when for other reasons). If current dressing causes regression in wound condition, may D/C ordered dressing product/s and apply Normal Saline Moist Dressing daily  until next Wound Healing Center / Other MD appointment. Notify Wound Healing Center of regression in wound condition at [phone]. Please direct any NON-WOUND related issues/requests for orders to patient's Primary Care Physician Wound #11 Midline Abdomen - Lower Quadrant: Continue [facility] Visits - [facility] to order appropriate wound care supplies for patient Home Health Nurse may visit PRN to address patient s wound care needs. FACE TO FACE ENCOUNTER: MEDICARE and MEDICAID PATIENTS: I certify that this patient is under my care and that I had a face-to-face encounter that meets the physician face-to-face encounter requirements with this patient on this date. The encounter with the patient was in whole or in part for the following MEDICAL CONDITION: (primary reason for Home Healthcare) MEDICAL NECESSITY: I certify, that based on my findings, NURSING services are a medically necessary home health service. HOME BOUND STATUS: I certify that my clinical findings support that this patient is homebound (i.e., Due to illness or injury, pt requires aid of supportive devices such as crutches, cane, wheelchairs, walkers, the use of special transportation or the assistance of another person to leave their place of residence. There is a normal inability to leave the home and doing so requires considerable and taxing effort. Other absences are for medical reasons / religious services and are infrequent or of short duration when for other reasons). If current dressing causes regression in wound condition, may D/C ordered dressing product/s and apply Normal Saline Moist Dressing daily until next Wound Healing Center / Other MD appointment. Notify Wound Healing Center of regression in wound condition at [phone]. Please direct any NON-WOUND related issues/requests for orders to patient's Primary Care Physician Wound #12 Left Abdomen - Lower Quadrant: Continue [facility] Visits - [facility] to order appropriate wound care supplies for patient Home Health Nurse may visit PRN to address patient s wound care needs. FACE TO FACE ENCOUNTER: MEDICARE and MEDICAID PATIENTS: I certify that this patient is under my care and that I had a face-to-face encounter that meets the physician face-to-face encounter requirements with this patient on this date. The encounter with the patient was in whole or in part for the following MEDICAL CONDITION: (primary reason for Home Healthcare) MEDICAL NECESSITY: I certify, that based on my findings, NURSING services are a medically necessary home health service. HOME BOUND STATUS: I certify that my clinical findings support that this patient is homebound (i.e., Due to illness or injury, pt requires aid of supportive devices such as crutches, cane, wheelchairs, walkers, the use of special transportation or the assistance of another person to leave their place of residence. There is a normal inability to leave the home and doing so requires considerable and taxing effort. Other absences are for medical reasons / religious services and are infrequent or of short duration when for other reasons). If current dressing causes regression in wound condition, may D/C ordered dressing product/s and apply Normal Saline Moist Dressing daily until next Wound Healing Center / Other MD appointment. Notify Wound Healing Center of regression in wound condition at [phone]. Please direct any NON-WOUND related issues/requests for orders to patient's Primary Care Physician Wound #13 Left Calcaneus: Continue [facility] Visits - [facility] to order appropriate wound care supplies for patient Home Health Nurse may visit PRN to address patient s wound care needs. FACE TO FACE ENCOUNTER: MEDICARE and MEDICAID PATIENTS: I certify that this patient is under my care and that I had a face-to-face encounter that meets the physician face-to-face encounter requirements with  this patient on this date. The encounter with the patient was in whole or in part for the following MEDICAL CONDITION: (primary reason for Home Healthcare) MEDICAL NECESSITY: I certify, that based on my findings, NURSING services are a medically necessary home health  service. HOME BOUND STATUS: I certify that my clinical findings support that this patient is homebound (i.e., Due to illness or injury, pt requires aid of supportive devices such as crutches, cane, wheelchairs, walkers, the use of special transportation or the assistance of another person to leave their place of residence. There is a normal inability to leave the home and doing so requires considerable and taxing effort. Other absences are ZAHLIA, DESHAZER ([id number]) for medical reasons / religious services and are infrequent or of short duration when for other reasons). If current dressing causes regression in wound condition, may D/C ordered dressing product/s and apply Normal Saline Moist Dressing daily until next Wound Healing Center / Other MD appointment. Notify Wound Healing Center of regression in wound condition at [phone]. Please direct any NON-WOUND related issues/requests for orders to patient's Primary Care Physician Wound #14 Left Amputation Site - Digit: Continue [facility] Visits - [facility] to order appropriate wound care supplies for patient Home Health Nurse may visit PRN to address patient s wound care needs. FACE TO FACE ENCOUNTER: MEDICARE and MEDICAID PATIENTS: I certify that this patient is under my care and that I had a face-to-face encounter that meets the physician face-to-face encounter requirements with this patient on this date. The encounter with the patient was in whole or in part for the following MEDICAL CONDITION: (primary reason for Home Healthcare) MEDICAL NECESSITY: I certify, that based on my findings, NURSING services are a medically necessary home health service. HOME BOUND  STATUS: I certify that my clinical findings support that this patient is homebound (i.e., Due to illness or injury, pt requires aid of supportive devices such as crutches, cane, wheelchairs, walkers, the use of special transportation or the assistance of another person to leave their place of residence. There is a normal inability to leave the home and doing so requires considerable and taxing effort. Other absences are for medical reasons / religious services and are infrequent or of short duration when for other reasons). If current dressing causes regression in wound condition, may D/C ordered dressing product/s and apply Normal Saline Moist Dressing daily until next Wound Healing Center / Other MD appointment. Notify Wound Healing Center of regression in wound condition at [phone]. Please direct any NON-WOUND related issues/requests for orders to patient's Primary Care Physician Wound #8 Right,Lateral Lower Leg: Continue [facility] Visits - [facility] to order appropriate wound care supplies for patient Home Health Nurse may visit PRN to address patient s wound care needs. FACE TO FACE ENCOUNTER: MEDICARE and MEDICAID PATIENTS: I certify that this patient is under my care and that I had a face-to-face encounter that meets the physician face-to-face encounter requirements with this patient on this date. The encounter with the patient was in whole or in part for the following MEDICAL CONDITION: (primary reason for Home Healthcare) MEDICAL NECESSITY: I certify, that based on my findings, NURSING services are a medically necessary home health service. HOME BOUND STATUS: I certify that my clinical findings support that this patient is homebound (i.e., Due to illness or injury, pt requires aid of supportive devices such as crutches, cane, wheelchairs, walkers, the use of special transportation or the assistance of another person to leave their place of residence. There is a normal  inability to leave the home and doing so requires considerable and taxing effort. Other absences are for medical reasons / religious services and are infrequent or of short duration when for other reasons). If current dressing causes  regression in wound condition, may D/C ordered dressing product/s and apply Normal Saline Moist Dressing daily until next Wound Healing Center / Other MD appointment. Notify Wound Healing Center of regression in wound condition at [phone]. Please direct any NON-WOUND related issues/requests for orders to patient's Primary Care Physician Wound #9 Right Achilles: [facility]its - [name] to order appropriate wound care supplies for patient Home Health Nurse may visit PRN to address patient s wound care needs. FACE TO FACE ENCOUNTER: MEDICARE and MEDICAID PATIENTS: I certify that this patient is under my care and that I had a face-to-face encounter that meets the physician face-to-face encounter requirements with this patient on this date. The encounter with the patient was in whole or in part for the following MEDICAL CONDITION: (primary reason for Home Healthcare) MEDICAL NECESSITY: I certify, that based on my findings, NURSING services are a medically necessary home health service. HOME BOUND STATUS: I certify that my clinical findings support that this patient is homebound (i.e., Due to illness or injury, pt requires aid of supportive devices such as crutches, cane, wheelchairs, walkers, the use of special transportation or the assistance of another person to leave their place of residence. There is a normal inability to leave the home and doing so requires considerable and taxing effort. Other absences are AADYA, KINDLER ([id number]) for medical reasons / religious services and are infrequent or of short duration when for other reasons). If current dressing causes regression in wound condition, may D/C ordered dressing product/s and  apply Normal Saline Moist Dressing daily until next Wound Healing Center / Other MD appointment. Notify Wound Healing Center of regression in wound condition at [phone]. Please direct any NON-WOUND related issues/requests for orders to patient's Primary Care Physician Negative Pressure Wound Therapy: Wound #9 Right Achilles: Wound VAC settings at 125/130 mmHg continuous pressure. Use BLACK/GREEN foam to wound cavity. Use WHITE foam to fill any tunnel/s and/or undermining. Change VAC dressing 3 X WEEK. Change canister as indicated when full. Nurse may titrate settings and frequency of dressing changes as clinically indicated. Laboratory ordered were: [facility] Bx report - midline abdomen o We continue with Santyl-based dressings to the abdominal wound Silver collagen to all wounds on her lower extremities including under the wound VAC in the right Achilles area. I suspect the area on the left heel is a pressure ulcer. By the sounds of what her son describes she has restless leg syndrome we gave her bunny boots bilaterally Await punch biopsy results from the midline abdominal wound. 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 Other Clinician: Date of Birth/Sex: Female) Treating Abbi Mancini Primary Care Physician/Extender: Rex Kras, Heidi Physician: Weeks in Treatment: 2 Referring Physician: [name] Diagnosis Coding ICD-10 Codes Code Description E11.621 Type 2 diabetes mellitus with foot  ulcer Unspecified open wound of abdominal wall, left lower quadrant without penetration into S31.104A peritoneal cavity, initial encounter E11.51 Type 2 diabetes mellitus with diabetic peripheral angiopathy without gangrene Facility Procedures CPT4: Description Modifier Quantity Code [id number] 11100 - BIOPSY SKIN-ONE 1 ICD-10 Description Diagnosis S31.104A Unspecified open wound of abdominal wall, left lower quadrant without penetration into peritoneal cavity, initial encounter  CPT4: [id number] - DEB SUBQ TISSUE 20 SQ CM/< 1 ICD-10 Description Diagnosis E11.621 Type 2 diabetes mellitus with foot ulcer Physician Procedures CPT4: Description Modifier Quantity Code [id number] 11100 - WC PHYS BIOPSY-SKIN ONE 1 ICD-10 Description Diagnosis S31.104A Unspecified open wound of abdominal wall, left lower quadrant without penetration into peritoneal cavity, initial encounter CPT4: [id number] 11042 - WC PHYS SUBQ TISS 20 SQ CM 1 ICD-10 Description Diagnosis JENICKA, COXE ([id number]) Electronic Signature(s) Signed: 01/01/2016 7:57:27 AM By: Baltazar Najjar MD Entered By: Baltazar Najjar on 12/31/2015 14:47:19

## 2016-01-03 LAB — SURGICAL PATHOLOGY

## 2016-01-07 ENCOUNTER — Ambulatory Visit: Payer: Medicare Other | Admitting: Internal Medicine

## 2016-01-14 ENCOUNTER — Encounter: Payer: Medicare Other | Admitting: Surgery

## 2016-01-14 DIAGNOSIS — E11621 Type 2 diabetes mellitus with foot ulcer: Secondary | ICD-10-CM | POA: Diagnosis not present

## 2016-01-14 NOTE — Progress Notes (Signed)
[name], GRIEVES ([id number]) Visit Report for 01/14/2016 Arrival Information Details Patient Name: [name], [name]. Date of Service: 01/14/2016 9:15 AM Medical Record Patient Account Number: [id number] Number: Treating RN: Emiya, Loomer February 16, 1934 (82 y.o. Other Clinician: Date of Birth/Sex: Female) Treating ROBSON, MICHAEL Primary Care Physician: [name] Physician/Extender: G Referring Physician: Charolotte Capuchin in Treatment: 4 Visit Information History Since Last Visit Added or deleted any medications: No Patient Arrived: Wheel Chair Any new allergies or adverse reactions: No Arrival Time: 09:24 Had a fall or experienced change in No Accompanied By: son activities of daily living that may affect Transfer Assistance: Manual risk of falls: Patient Identification Verified: Yes Signs or symptoms of abuse/neglect since last No Secondary Verification Process Yes visito Completed: Hospitalized since last visit: No Patient Has Alerts: Yes Pain Present Now: Yes Patient Alerts: Patient on Blood Thinner DMII Warfarin ABI [place] Bilateral NO BP RIGHT ARM Electronic Signature(s) Signed: 01/14/2016 1:24:09 PM By: Curtis Sites Entered By: Curtis Sites on 01/14/2016 09:25:21 [name] ([id number]) -------------------------------------------------------------------------------- Encounter Discharge Information Details Patient Name: [name]. Date of Service: 01/14/2016 9:15 AM Medical Record Number: [id number] Patient Account Number: [id number] Date of Birth/Sex: [birth year] ([name]) Treating RN: Curtis Sites Primary Care Physician: [name] Other Clinician: Referring Physician: [name] Treating Physician/Extender: Rudene Re in Treatment: 4 Encounter Discharge Information Items Discharge Pain Level: 0 Discharge Condition: Stable Ambulatory Status: Wheelchair Discharge Destination: Home Transportation:  Private Auto Accompanied By: son Schedule Follow-up Appointment: Yes Medication Reconciliation completed and provided to Patient/Care No Vali Capano: Provided on Clinical Summary of Care: 01/14/2016 Form Type Recipient Paper Patient [facility] Electronic Signature(s) Signed: 01/14/2016 12:23:29 PM By: Curtis Sites Previous Signature: 01/14/2016 10:42:22 AM Version By: Gwenlyn Perking Entered By: Curtis Sites on 01/14/2016 12:23:28 [name] ([id number]) -------------------------------------------------------------------------------- Lower Extremity Assessment Details Patient Name: [name]. Date of Service: 01/14/2016 9:15 AM Medical Record Patient Account Number: [id number] Number: Treating RN: Kimisha, Eunice Dec 08, 1933 (82 y.o. Other Clinician: Date of Birth/Sex: Female) Treating ROBSON, MICHAEL Primary Care Physician: [name] Physician/Extender: G Referring Physician: Charolotte Capuchin in Treatment: 4 Vascular Assessment Pulses: Posterior Tibial Dorsalis Pedis Palpable: [Right:Yes] Extremity colors, hair growth, and conditions: Extremity Color: [Right:Hyperpigmented] Hair Growth on Extremity: [Right:No] Temperature of Extremity: [Right:Warm] Capillary Refill: [Right:< 3 seconds] Electronic Signature(s) Signed: 01/14/2016 1:24:09 PM By: Curtis Sites Entered By: Curtis Sites on 01/14/2016 09:39:10 [name] ([id number]) -------------------------------------------------------------------------------- Multi Wound Chart Details Patient Name: [name] Date of Service: 01/14/2016 9:15 AM Medical Record Number: [id number] Patient Account Number: [id number] Date of Birth/Sex: [birth year] (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 Comorbid History: Arrhythmia, Congestive Arrhythmia, Congestive Arrhythmia, Congestive Heart Failure, Heart Failure, Heart Failure, Hypertension, Type II Hypertension, Type II Hypertension, Type II Diabetes Diabetes Diabetes Date Acquired: 06/02/2015 08/12/2015 06/10/2015 Weeks of Treatment: 4 4 4  Wound Status: Open Open Open Pending Amputation on Yes No No Presentation: Measurements L x W x D 1.4x1.6x0.1 3.8x7.2x1.4 1.6x9.6x1.4 (cm) Area (cm) : 1.759 21.488 12.064 Volume (cm) : 0.176 30.084 16.889 % Reduction  in Area: 4.30% 45.70% 31.60% % Reduction in Volume: 52.20% 62.00% 56.50% Classification: Full Thickness Without Full Thickness Without Full Thickness Without Exposed Support Exposed Support Exposed Support Structures Structures Structures HBO Classification: Grade 1 N/A N/A AILYNE, PAWLEY ([id number]) Exudate Amount: Medium Large Large Exudate Type: Serous Serous Serous Exudate Color: amber amber amber Foul Odor After Yes Yes Yes Cleansing: Odor Anticipated Due to No No No Product Use: Wound Margin: Flat and Intact Flat and Intact Flat and Intact Granulation Amount: Large (67-100%) Large (67-100%) Large (67-100%) Granulation Quality: Pink Red Red Necrotic Amount: Small (1-33%) Small (1-33%) Small (1-33%) Necrotic Tissue: Eschar, Adherent Slough Eschar, Adherent [name], Adherent Slough Exposed Structures: Fascia: No Fascia: No Fascia: No Fat: No Fat: No Fat: No Tendon: No Tendon: No Tendon: No Muscle: No Muscle: No Muscle: No Joint: No Joint: No Joint:  No Bone: No Bone: No Bone: No Limited to Skin Limited to Skin Limited to Skin Breakdown Breakdown Breakdown Epithelialization: None None None Debridement: N/A Debridement ([id number] N/A 11047) Time-Out Taken: N/A Yes N/A Pain Control: N/A Lidocaine 4% Topical N/A Solution Tissue Debrided: N/A Necrotic/Eschar, N/A Fibrin/Slough, Subcutaneous Level: N/A Skin/Subcutaneous N/A Tissue Debridement Area (sq N/A 27.36 N/A cm): Instrument: N/A Curette N/A Bleeding: N/A Minimum N/A Hemostasis Achieved: N/A Pressure N/A Procedural Pain: N/A 0 N/A Post Procedural Pain: N/A 0 N/A Debridement Treatment N/A Procedure was tolerated N/A Response: well Post Debridement N/A 3.8x7.2x1.4 N/A Measurements L x W x D (cm) Post Debridement N/A 30.084 N/A Volume: (cm) Periwound Skin Texture: Edema: No Edema: No Edema: No Excoriation: No Excoriation: No Excoriation: No Induration: No Induration: No Induration: No Callus: No Callus: No Callus: No BREUNA, LOVEALL ([id number]) Crepitus: No Crepitus: No Crepitus: No Fluctuance: No Fluctuance: No Fluctuance: No Friable: No Friable: No Friable: No Rash: No Rash: No Rash: No Scarring: No Scarring: No Scarring: No Periwound Skin Maceration: Yes Moist: Yes Moist: Yes Moisture: Moist: Yes Maceration: No Maceration: No Dry/Scaly: No Dry/Scaly: No Dry/Scaly: No Periwound Skin Color: Erythema: Yes Erythema: Yes Erythema: Yes Atrophie Blanche: No Atrophie Blanche: No Atrophie Blanche: No Cyanosis: No Cyanosis: No Cyanosis: No Ecchymosis: No Ecchymosis: No Ecchymosis: No Hemosiderin Staining: No Hemosiderin Staining: No Hemosiderin Staining: No Mottled: No Mottled: No Mottled: No Pallor: No Pallor: No Pallor: No Rubor: No Rubor: No Rubor: No Erythema Location: Circumferential Circumferential Circumferential Temperature: No Abnormality No Abnormality No Abnormality Tenderness on Yes Yes Yes Palpation: Wound  Preparation: Ulcer Cleansing: Ulcer Cleansing: Ulcer Cleansing: Rinsed/Irrigated with Rinsed/Irrigated with Rinsed/Irrigated with Saline Saline Saline Topical Anesthetic Topical Anesthetic Topical Anesthetic Applied: Other: lidocaine Applied: Other: lidocaine Applied: Other: lidocaine 4% 4% 4% Procedures Performed: N/A Debridement N/A Wound Number: [id number]  Photos: Wound Location: Left Calcaneus Right Amputation Site - Right Lower Leg - Lateral Digit Wounding Event: Gradually Appeared Surgical Injury Gradually Appeared Primary Etiology: Arterial Insufficiency Ulcer Open Surgical Wound Arterial Insufficiency Ulcer Comorbid History: Arrhythmia, Congestive Arrhythmia, Congestive Arrhythmia, Congestive Heart Failure, Heart Failure, Heart Failure, Hypertension, Type II Hypertension, Type II Hypertension, Type II Diabetes Diabetes Diabetes Date Acquired: 12/31/2015 11/11/2015 06/02/2015 Weeks of Treatment: 2 2 4  Wound Status: Open Open Open Pending Amputation on No No Yes Presentation: LETTI, TOWELL ([id number]) Measurements L x W x D 1x0.5x0.3 0.8x0.3x0.1 0.5x0.4x0.1 (cm) Area (cm) : 0.393 0.188 0.157 Volume (cm) : 0.118 0.019 0.016 % Reduction in Area: -51.70% 75.10% 98.50% % Reduction in Volume: -51.30% 74.70% 99.20% Classification: Full Thickness Without Full Thickness Without Full Thickness Without Exposed Support Exposed Support  Exposed Support Structures Structures Structures HBO Classification: Grade 1 N/A Grade 1 Exudate Amount: Large Medium Medium Exudate Type: Serosanguineous Serous Serous Exudate Color: red, brown amber amber Foul Odor After No No Yes Cleansing: Odor Anticipated Due to N/A N/A No Product Use: Wound Margin: Flat and Intact Flat and Intact Flat and Intact Granulation Amount: None Present (0%) None Present (0%) Medium (34-66%) Granulation Quality: N/A N/A Red, Pink Necrotic Amount: Large (67-100%) Large (67-100%) Medium (34-66%) Necrotic Tissue:  Eschar, Adherent Slough Eschar, Adherent Slough Eschar, Adherent Slough Exposed Structures: Fascia: No Fascia: No Fascia: No Fat: No Fat: No Fat: No Tendon: No Tendon: No Tendon: No Muscle: No Muscle: No Muscle: No Joint: No Joint: No Joint: No Bone: No Bone: No Bone: No Limited to Skin Limited to Skin Limited to Skin Breakdown Breakdown Breakdown Epithelialization: None None None Debridement: N/A N/A N/A Time-Out Taken: N/A N/A N/A Pain Control: N/A N/A N/A Tissue Debrided: N/A N/A N/A Level: N/A N/A N/A Debridement Area (sq N/A N/A N/A cm): Instrument: N/A N/A N/A Bleeding: N/A N/A N/A Hemostasis Achieved: N/A N/A N/A Procedural Pain: N/A N/A N/A Post Procedural Pain: N/A N/A N/A Debridement Treatment N/A N/A N/A Response: Post Debridement N/A N/A N/A Measurements L x W x D (cm) N/A N/A N/A Natalie RimaHOPKINS, Tiesha Y. ([id number]) Post Debridement Volume: (cm) Periwound Skin Texture: Edema: No Edema: No Edema: No Excoriation: No Excoriation: No Excoriation: No Induration: No Induration: No Induration: No Callus: No Callus: No Callus: No Crepitus: No Crepitus: No Crepitus: No Fluctuance: No Fluctuance: No Fluctuance: No Friable: No Friable: No Friable: No Rash: No Rash: No Rash: No Scarring: No Scarring: No Scarring: No Periwound Skin Moist: Yes Maceration: Yes Maceration: Yes Moisture: Maceration: No Moist: Yes Moist: Yes Dry/Scaly: No Dry/Scaly: No Dry/Scaly: No Periwound Skin Color: Atrophie Blanche: No Atrophie Blanche: No Erythema: Yes Cyanosis: No Cyanosis: No Atrophie Blanche: No Ecchymosis: No Ecchymosis: No Cyanosis: No Erythema: No Erythema: No Ecchymosis: No Hemosiderin Staining: No Hemosiderin Staining: No Hemosiderin Staining: No Mottled: No Mottled: No Mottled: No Pallor: No Pallor: No Pallor: No Rubor: No Rubor: No Rubor: No Erythema Location: N/A N/A Circumferential Temperature: No Abnormality No Abnormality  No Abnormality Tenderness on No No No Palpation: Wound Preparation: Ulcer Cleansing: Ulcer Cleansing: Ulcer Cleansing: Rinsed/Irrigated with Rinsed/Irrigated with Rinsed/Irrigated with Saline Saline Saline Topical Anesthetic Topical Anesthetic Topical Anesthetic Applied: Other: lidocaine Applied: Other: lidocaine Applied: Other: lidocaine 4% 4% 4% Procedures Performed: N/A N/A N/A Wound Number: 9 N/A N/A Photos: N/A N/A Wound Location: Right Achilles N/A N/A Wounding Event: Gradually Appeared N/A N/A Primary Etiology: Arterial Insufficiency Ulcer N/A N/A Comorbid History: Arrhythmia, Congestive N/A N/A Heart Failure, Hypertension, Type II Diabetes Natalie RimaHOPKINS, Eulah Y. ([id number]) Date Acquired: 06/02/2015 N/A N/A Weeks of Treatment: 4 N/A N/A Wound Status: Open N/A N/A Pending Amputation on Yes N/A N/A Presentation: Measurements L x W x D 9.2x5x0.3 N/A N/A (cm) Area (cm) : 36.128 N/A N/A Volume (cm) : 10.838 N/A N/A % Reduction in Area: -10.80% N/A N/A % Reduction in Volume: -10.80% N/A N/A Classification: Full Thickness Without N/A N/A Exposed Support Structures HBO Classification: Grade 1 N/A N/A Exudate Amount: N/A N/A N/A Exudate Type: N/A N/A N/A Exudate Color: N/A N/A N/A Foul Odor After Yes N/A N/A Cleansing: Odor Anticipated Due to No N/A N/A Product Use: Wound Margin: Flat and Intact N/A N/A Granulation Amount: Large (67-100%) N/A N/A Granulation Quality: Red N/A N/A Necrotic Amount: Small (1-33%) N/A N/A Necrotic Tissue: Eschar, Adherent Slough N/A  N/A Exposed Structures: Fascia: No N/A N/A Fat: No Tendon: No Muscle: No Joint: No Bone: No Limited to Skin Breakdown Epithelialization: None N/A N/A Debridement: Debridement ([id number](11042- N/A N/A 11047) Time-Out Taken: Yes N/A N/A Pain Control: Lidocaine 4% Topical N/A N/A Solution Tissue Debrided: Necrotic/Eschar, N/A N/A Fibrin/Slough, Subcutaneous Level: Skin/Subcutaneous N/A  N/A Tissue Debridement Area (sq 46 N/A N/A cm): Instrument: Forceps, Scissors N/A N/A Natalie RimaHOPKINS, Allisha Y. ([id number]) Bleeding: Minimum N/A N/A Hemostasis Achieved: Pressure N/A N/A Procedural Pain: 0 N/A N/A Post Procedural Pain: 0 N/A N/A Debridement Treatment Procedure was tolerated N/A N/A Response: well Post Debridement 9.2x5x0.8 N/A N/A Measurements L x W x D (cm) Post Debridement 28.903 N/A N/A Volume: (cm) Periwound Skin Texture: Edema: No N/A N/A Excoriation: No Induration: No Callus: No Crepitus: No Fluctuance: No Friable: No Rash: No Scarring: No Periwound Skin Maceration: Yes N/A N/A Moisture: Moist: Yes Dry/Scaly: No Periwound Skin Color: Erythema: Yes N/A N/A Atrophie Blanche: No Cyanosis: No Ecchymosis: No Hemosiderin Staining: No Mottled: No Pallor: No Rubor: No Erythema Location: Circumferential N/A N/A Temperature: N/A N/A N/A Tenderness on Yes N/A N/A Palpation: Wound Preparation: Ulcer Cleansing: N/A N/A Rinsed/Irrigated with Saline Topical Anesthetic Applied: Other: lidocaine 4% Procedures Performed: Debridement N/A N/A Treatment Notes Electronic Signature(s) Signed: 01/14/2016 1:24:09 PM By: Curtis Sitesorthy, Joanna Entered By: Curtis Sitesorthy, Joanna on 01/14/2016 10:15:43 Natalie RimaHOPKINS, Arthelia Y. ([id number]) Natalie RimaHOPKINS, Anderson Y. ([id number]) -------------------------------------------------------------------------------- Multi-Disciplinary Care Plan Details Patient Name: Natalie RimaHOPKINS, Lilas Y. Date of Service: 01/14/2016 9:15 AM Medical Record Number: [id number] Patient Account Number: [id number] Date of Birth/Sex: Oct 20, 1933 ([name]) Treating RN: Curtis Sitesorthy, Joanna Primary Care Physician: Rolm GalaGrandis, Heidi Other Clinician: Referring Physician: Rolm GalaGrandis, Heidi Treating Physician/Extender: Rudene ReBritto, Errol Weeks in Treatment: 4 Active Inactive Abuse / Safety / Falls / Self Care Management Nursing Diagnoses: Impaired physical mobility Potential for  falls Goals: Patient will remain injury free Date Initiated: 12/17/2015 Goal Status: Active Interventions: Assess fall risk on admission and as needed Notes: Wound/Skin Impairment Nursing Diagnoses: Impaired tissue integrity Goals: Patient/caregiver will verbalize understanding of skin care regimen Date Initiated: 12/17/2015 Goal Status: Active Ulcer/skin breakdown will have a volume reduction of 30% by week 4 Date Initiated: 12/17/2015 Goal Status: Active Ulcer/skin breakdown will have a volume reduction of 50% by week 8 Date Initiated: 12/17/2015 Goal Status: Active Ulcer/skin breakdown will have a volume reduction of 80% by week 12 Date Initiated: 12/17/2015 Goal Status: Active Ulcer/skin breakdown will heal within 14 weeks Date Initiated: 12/17/2015 Natalie RimaHOPKINS, Steele Y. ([id number]) Goal Status: Active Interventions: Assess patient/caregiver ability to obtain necessary supplies Assess patient/caregiver ability to perform ulcer/skin care regimen upon admission and as needed Assess ulceration(s) every visit Notes: Electronic Signature(s) Signed: 01/14/2016 1:24:09 PM By: Curtis Sitesorthy, Joanna Entered By: Curtis Sitesorthy, Joanna on 01/14/2016 10:15:31 Natalie RimaHOPKINS, Tonimarie Y. ([id number]) -------------------------------------------------------------------------------- Pain Assessment Details Patient Name: Natalie RimaHOPKINS, Shineka Y. Date of Service: 01/14/2016 9:15 AM Medical Record Patient Account Number: [id number] Number: Treating RN: Curtis SitesDorthy, Joanna Oct 20, 1933 (82 y.o. Other Clinician: Date of Birth/Sex: Female) Treating ROBSON, MICHAEL Primary Care Physician: Rolm GalaGrandis, Heidi Physician/Extender: G Referring Physician: Charolotte CapuchinGrandis, Heidi Weeks in Treatment: 4 Active Problems Location of Pain Severity and Description of Pain Patient Has Paino Yes Site Locations Pain Location: Pain in Ulcers With Dressing Change: Yes Duration of the Pain. Constant / Intermittento Constant Pain Management and  Medication Current Pain Management: Notes Topical or injectable lidocaine is offered to patient for acute pain when surgical debridement is performed. If needed, Patient is instructed to use over the counter pain medication for the  following 24-48 hours after debridement. Wound care MDs do not prescribed pain medications. Patient has chronic pain or uncontrolled pain. Patient has been instructed to make an appointment with their Primary Care Physician for pain management. Electronic Signature(s) Signed: 01/14/2016 1:24:09 PM By: Curtis Sites Entered By: Curtis Sites on 01/14/2016 09:25:45 [name] ([id number]) -------------------------------------------------------------------------------- Patient/Caregiver Education Details Patient Name: [name] Date of Service: 01/14/2016 9:15 AM Medical Record Number: [id number] Patient Account Number: [id number] Date of Birth/Gender: 07-06-1933 ([name]) Treating RN: Curtis Sites Primary Care Physician: [name] Other Clinician: Referring Physician: [name] Treating Physician/Extender: Rudene Re in Treatment: 4 Education Assessment Education Provided To: Patient and Caregiver Education Topics Provided Wound/Skin Impairment: Handouts: Other: wound care as ordered Methods: Demonstration, Explain/Verbal Responses: State content correctly Electronic Signature(s) Signed: 01/14/2016 1:24:09 PM By: Curtis Sites Entered By: Curtis Sites on 01/14/2016 12:23:47 [name] ([id number]) -------------------------------------------------------------------------------- Wound Assessment Details Patient Name: [name]. Date of Service: 01/14/2016 9:15 AM Medical Record Number: [id number] Patient Account Number: [id number] Date of Birth/Sex: August 17, 1933 ([name]) Treating RN: Curtis Sites Primary Care Physician: [name] Other Clinician: Referring Physician: [name] Treating Physician/Extender: Rudene Re in Treatment: 4 Wound Status Wound Number: 10 Primary Arterial Insufficiency Ulcer Etiology: Wound Location: Right Calcaneus Wound Open Wounding Event: Gradually Appeared Status: Date Acquired: 06/02/2015 Comorbid Arrhythmia, Congestive Heart Failure, Weeks Of Treatment: 4 History: Hypertension, Type II Diabetes Clustered Wound: No Pending Amputation On Presentation Photos Wound Measurements Length: (cm) 1.4 Width: (cm) 1.6 Depth: (cm) 0.1 Area: (cm) 1.759 Volume: (cm) 0.176 % Reduction in Area: 4.3% % Reduction in Volume: 52.2% Epithelialization: None Tunneling: No Undermining: No Wound Description Full Thickness Without Classification: Exposed Support Structures Diabetic Severity Grade 1 (Wagner): Wound Margin: Flat and Intact Exudate Amount: Medium Exudate Type: Serous Exudate Color: amber Foul Odor After Cleansing: Yes Due to Product Use: No Wound Bed Granulation Amount: Large (67-100%) Exposed Structure CHENA, CHOHAN ([id number]) Granulation Quality: Pink Fascia Exposed: No Necrotic Amount: Small (1-33%) Fat Layer Exposed: No Necrotic Quality: Eschar, Adherent Slough Tendon Exposed: No Muscle Exposed: No Joint Exposed: No Bone Exposed: No Limited to Skin Breakdown Periwound Skin Texture Texture Color No Abnormalities Noted: No No Abnormalities Noted: No Callus: No Atrophie Blanche: No Crepitus: No Cyanosis: No Excoriation: No Ecchymosis: No Fluctuance: No Erythema: Yes Friable: No Erythema Location: Circumferential Induration: No Hemosiderin Staining: No Localized Edema: No Mottled: No Rash: No Pallor: No Scarring: No Rubor: No Moisture Temperature / Pain No Abnormalities Noted: No Temperature: No Abnormality Dry / Scaly: No Tenderness on Palpation: Yes Maceration: Yes Moist: Yes Wound Preparation Ulcer Cleansing: Rinsed/Irrigated with Saline Topical Anesthetic  Applied: Other: lidocaine 4%, Treatment Notes Wound #10 (Right Calcaneus) 1. Cleansed with: Clean wound with Normal Saline 2. Anesthetic Topical Lidocaine 4% cream to wound bed prior to debridement 4. Dressing Applied: Aquacel Ag 5. Secondary Dressing Applied Guaze, ABD and kerlix/Conform 7. Secured with Secretary/administrator) Signed: 01/14/2016 1:24:09 PM By: Curtis Sites Entered By: Curtis Sites on 01/14/2016 10:02:05 MATTISON, GOLAY ([id number]) [name] ([id number]) -------------------------------------------------------------------------------- Wound Assessment Details Patient Name: DEONNE, ROOKS. Date of Service: 01/14/2016 9:15 AM Medical Record Number: [id number] Patient Account Number: [id number] Date of Birth/Sex: [birth year] ([name]) Treating RN: Curtis Sites Primary Care Physician: [name] Other Clinician: Referring Physician: [name] Treating Physician/Extender: Rudene Re in Treatment: 4 Wound Status Wound Number: 11 Primary Arterial Insufficiency Ulcer Etiology: Wound Location: Abdomen - Lower Quadrant -  Midline Wound Open Status: Wounding Event: Trauma Comorbid Arrhythmia, Congestive Heart Failure, Date Acquired: 08/12/2015 History: Hypertension, Type II Diabetes Weeks Of Treatment: 4 Clustered Wound: No Photos Wound Measurements Length: (cm) 3.8 Width: (cm) 7.2 Depth: (cm) 1.4 Area: (cm) 21.488 Volume: (cm) 30.084 % Reduction in Area: 45.7% % Reduction in Volume: 62% Epithelialization: None Tunneling: No Undermining: No Wound Description Full Thickness Without Exposed Classification: Support Structures Wound Margin: Flat and Intact Exudate Large Amount: Exudate Type: Serous Exudate Color: amber Foul Odor After Cleansing: Yes Due to Product Use: No Wound Bed Granulation Amount: Large (67-100%) Exposed Structure Granulation Quality: Red Fascia Exposed: No Necrotic Amount: Small  (1-33%) Fat Layer Exposed: No AMADA, HALLISEY ([id number]) Necrotic Quality: Eschar, Adherent Slough Tendon Exposed: No Muscle Exposed: No Joint Exposed: No Bone Exposed: No Limited to Skin Breakdown Periwound Skin Texture Texture Color No Abnormalities Noted: No No Abnormalities Noted: No Callus: No Atrophie Blanche: No Crepitus: No Cyanosis: No Excoriation: No Ecchymosis: No Fluctuance: No Erythema: Yes Friable: No Erythema Location: Circumferential Induration: No Hemosiderin Staining: No Localized Edema: No Mottled: No Rash: No Pallor: No Scarring: No Rubor: No Moisture Temperature / Pain No Abnormalities Noted: No Temperature: No Abnormality Dry / Scaly: No Tenderness on Palpation: Yes Maceration: No Moist: Yes Wound Preparation Ulcer Cleansing: Rinsed/Irrigated with Saline Topical Anesthetic Applied: Other: lidocaine 4%, Treatment Notes Wound #11 (Midline Abdomen - Lower Quadrant) 1. Cleansed with: Clean wound with Normal Saline 2. Anesthetic Topical Lidocaine 4% cream to wound bed prior to debridement 4. Dressing Applied: Aquacel Ag 5. Secondary Dressing Applied Guaze, ABD and kerlix/Conform 7. Secured with Secretary/administrator) Signed: 01/14/2016 1:24:09 PM By: Curtis Sites Entered By: Curtis Sites on 01/14/2016 10:03:04 [name] ([id number]) -------------------------------------------------------------------------------- Wound Assessment Details Patient Name: [name] Date of Service: 01/14/2016 9:15 AM Medical Record Number: [id number] Patient Account Number: [id number] Date of Birth/Sex: 04-10-1934 (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 -  Lower Quadrant Wound Open Status: Wounding Event: Trauma Comorbid Arrhythmia, Congestive Heart Failure, Date Acquired: 06/10/2015 History: Hypertension, Type II Diabetes Weeks Of Treatment: 4 Clustered Wound: No Photos Wound Measurements Length: (cm) 1.6 Width: (cm) 9.6 Depth: (cm) 1.4 Area: (cm) 12.064 Volume: (cm) 16.889 % Reduction in Area: 31.6% % Reduction in Volume: 56.5% Epithelialization: None Tunneling: No Undermining: No Wound Description Full Thickness Without Exposed Classification: Support Structures Wound Margin: Flat and Intact Exudate Large Amount: Exudate Type: Serous Exudate Color: amber Foul Odor After Cleansing: Yes Due to Product Use: No Wound Bed Granulation Amount: Large (67-100%) Exposed Structure Granulation Quality: Red Fascia Exposed: No Necrotic Amount: Small (1-33%) Fat Layer Exposed: No MIN, TUNNELL ([id number]) Necrotic Quality: Eschar, Adherent Slough Tendon Exposed: No Muscle Exposed: No Joint Exposed: No Bone Exposed: No Limited to Skin Breakdown Periwound Skin Texture Texture Color No Abnormalities Noted: No No Abnormalities Noted: No Callus: No Atrophie Blanche: No Crepitus: No Cyanosis: No Excoriation: No Ecchymosis: No Fluctuance: No Erythema: Yes Friable: No Erythema Location: Circumferential Induration: No Hemosiderin Staining: No Localized Edema: No Mottled: No Rash: No Pallor: No Scarring: No Rubor: No Moisture Temperature / Pain No Abnormalities Noted: No Temperature: No Abnormality Dry / Scaly: No Tenderness on Palpation: Yes Maceration: No Moist: Yes Wound Preparation Ulcer Cleansing: Rinsed/Irrigated with Saline Topical Anesthetic Applied: Other: lidocaine 4%, Treatment Notes Wound #  12 (Left Abdomen - Lower Quadrant) 1. Cleansed with: Clean wound with Normal Saline 2. Anesthetic Topical Lidocaine 4% cream to wound bed prior to debridement 4. Dressing Applied: Aquacel Ag 5.  Secondary Dressing Applied Guaze, ABD and kerlix/Conform 7. Secured with Secretary/administrator) Signed: 01/14/2016 1:24:09 PM By: Curtis Sites Entered By: Curtis Sites on 01/14/2016 10:12:37 [name] ([id number]) -------------------------------------------------------------------------------- Wound Assessment Details Patient Name: [name] Date of Service: 01/14/2016 9:15 AM Medical Record Number: [id number] Patient Account Number: [id number] Date of Birth/Sex: 04-Jul-1933 (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 Wound Open Wounding Event: Gradually Appeared Status: Date Acquired: 12/31/2015 Comorbid Arrhythmia, Congestive Heart Failure, Weeks Of Treatment: 2 History: Hypertension, Type II Diabetes Clustered Wound: No Photos Wound Measurements Length: (cm) 1 % Reduction Width: (cm) 0.5 % Reduction Depth: (cm) 0.3 Epithelializ Area: (cm) 0.393 Tunneling: Volume: (cm) 0.118 Undermining in Area: -51.7% in Volume: -51.3% ation: None No : No Wound Description Full Thickness Without Exposed Classification: Support Structures Diabetic Severity Grade 1 (Wagner): Wound Margin: Flat and Intact Exudate Amount: Large Exudate Type: Serosanguineous Exudate Color: red, brown Foul Odor After Cleansing: No Wound Bed Granulation Amount: None Present (0%) Exposed Structure Necrotic Amount: Large (67-100%) Fascia Exposed: No LAGUANA, DESAUTEL ([id number]) Necrotic Quality: Eschar, Adherent Slough Fat Layer Exposed: No Tendon Exposed: No Muscle Exposed: No Joint Exposed: No Bone Exposed: No Limited to Skin Breakdown Periwound Skin Texture Texture Color No Abnormalities Noted:  No No Abnormalities Noted: No Callus: No Atrophie Blanche: No Crepitus: No Cyanosis: No Excoriation: No Ecchymosis: No Fluctuance: No Erythema: No Friable: No Hemosiderin Staining: No Induration: No Mottled: No Localized Edema: No Pallor: No Rash: No Rubor: No Scarring: No Temperature / Pain Moisture Temperature: No Abnormality No Abnormalities Noted: No Dry / Scaly: No Maceration: No Moist: Yes Wound Preparation Ulcer Cleansing: Rinsed/Irrigated with Saline Topical Anesthetic Applied: Other: lidocaine 4%, Treatment Notes Wound #13 (Left Calcaneus) 1. Cleansed with: Clean wound with Normal Saline 2. Anesthetic Topical Lidocaine 4% cream to wound bed prior to debridement 4. Dressing Applied: Other dressing (specify in notes) Notes betadine paint Electronic Signature(s) Signed: 01/14/2016 1:24:09 PM By: Curtis Sites Entered By: Curtis Sites on 01/14/2016 10:13:05 [name] ([id number]) -------------------------------------------------------------------------------- Wound Assessment Details Patient Name: [name] Date of Service: 01/14/2016 9:15 AM Medical Record Number: [id number] Patient Account Number: [id number] Date of Birth/Sex: [birth year] ([name]) Treating RN: Curtis Sites Primary Care Physician: [name] Other Clinician: Referring Physician: [name] Treating Physician/Extender: Rudene Re in Treatment: 4 Wound Status Wound Number: 14 Primary Open Surgical Wound Etiology: Wound Location: Right Amputation Site - Digit Wound Open Wounding Event: Surgical Injury Status: Date Acquired: 11/11/2015 Comorbid Arrhythmia, Congestive Heart Failure, Weeks Of Treatment: 2 History: Hypertension, Type II Diabetes Clustered Wound: No Photos Wound Measurements Length: (cm) 0.8 Width: (cm) 0.3 Depth: (cm) 0.1 Area: (cm) 0.188 Volume: (cm) 0.019 % Reduction in Area: 75.1% % Reduction in Volume:  74.7% Epithelialization: None Tunneling: No Undermining: No Wound Description Full Thickness Without Exposed Classification: Support Structures Wound Margin: Flat and Intact Exudate Medium Amount: Exudate Type: Serous Exudate Color: amber Foul Odor After Cleansing: No Wound Bed Granulation Amount: None Present (0%) Exposed Structure Necrotic Amount: Large (67-100%) Fascia Exposed: No Necrotic Quality: Eschar, Adherent Slough Fat Layer Exposed: No Entsminger, Chyrl [place]  Y. ([id number]) Tendon Exposed: No Muscle Exposed: No Joint Exposed: No Bone Exposed: No Limited to Skin Breakdown Periwound Skin Texture Texture Color No Abnormalities Noted: No No Abnormalities Noted: No Callus: No Atrophie Blanche: No Crepitus: No Cyanosis: No Excoriation: No Ecchymosis: No Fluctuance: No Erythema: No Friable: No Hemosiderin Staining: No Induration: No Mottled: No Localized Edema: No Pallor: No Rash: No Rubor: No Scarring: No Temperature / Pain Moisture Temperature: No Abnormality No Abnormalities Noted: No Dry / Scaly: No Maceration: Yes Moist: Yes Wound Preparation Ulcer Cleansing: Rinsed/Irrigated with Saline Topical Anesthetic Applied: Other: lidocaine 4%, Treatment Notes Wound #14 (Right Amputation Site - Digit) 1. Cleansed with: Clean wound with Normal Saline 2. Anesthetic Topical Lidocaine 4% cream to wound bed prior to debridement 4. Dressing Applied: Other dressing (specify in notes) Notes betadine paint Electronic Signature(s) Signed: 01/14/2016 1:24:09 PM By: Curtis Sites Entered By: Curtis Sites on 01/14/2016 10:13:34 [name] ([id number]) -------------------------------------------------------------------------------- Wound Assessment Details Patient Name: [name] Date of Service: 01/14/2016 9:15 AM Medical Record Number: [id number] Patient Account Number: [id number] Date of Birth/Sex: 05-25-1934 ([name]) Treating RN: Curtis Sites Primary Care Physician: [name] Other Clinician: Referring Physician: [name] Treating Physician/Extender: Rudene Re in Treatment: 4 Wound Status Wound Number: 8 Primary Arterial Insufficiency Ulcer Etiology: Wound Location: Right Lower Leg - Lateral Wound Open Wounding Event: Gradually Appeared Status: Date Acquired: 06/02/2015 Comorbid Arrhythmia, Congestive Heart Failure, Weeks Of Treatment: 4 History: Hypertension, Type II Diabetes Clustered Wound: No Pending Amputation On Presentation Photos Wound Measurements Length: (cm) 0.5 Width: (cm) 0.4 Depth: (cm) 0.1 Area: (cm) 0.157 Volume: (cm) 0.016 % Reduction in Area: 98.5% % Reduction in Volume: 99.2% Epithelialization: None Tunneling: No Undermining: No Wound Description Full Thickness Without Classification: Exposed Support Structures Diabetic Severity Grade 1 (Wagner): Wound Margin: Flat and Intact Exudate Amount: Medium Exudate Type: Serous Exudate Color: amber Foul Odor After Cleansing: Yes Due to Product Use: No Wound Bed Granulation Amount: Medium (34-66%) Exposed Structure AYA, GEISEL ([id number]) Granulation Quality: Red, Pink Fascia Exposed: No Necrotic Amount: Medium (34-66%) Fat Layer Exposed: No Necrotic Quality: Eschar, Adherent Slough Tendon Exposed: No Muscle Exposed: No Joint Exposed: No Bone Exposed: No Limited to Skin Breakdown Periwound Skin Texture Texture Color No Abnormalities Noted: No No Abnormalities Noted: No Callus: No Atrophie Blanche: No Crepitus: No Cyanosis: No Excoriation: No Ecchymosis: No Fluctuance: No Erythema: Yes Friable: No Erythema Location: Circumferential Induration: No Hemosiderin Staining: No Localized Edema: No Mottled: No Rash: No Pallor: No Scarring: No Rubor: No Moisture Temperature / Pain No Abnormalities Noted: No Temperature: No Abnormality Dry / Scaly: No Maceration: Yes Moist: Yes Wound  Preparation Ulcer Cleansing: Rinsed/Irrigated with Saline Topical Anesthetic Applied: Other: lidocaine 4%, Treatment Notes Wound #8 (Right, Lateral Lower Leg) 1. Cleansed with: Clean wound with Normal Saline 2. Anesthetic Topical Lidocaine 4% cream to wound bed prior to debridement 4. Dressing Applied: Aquacel Ag 5. Secondary Dressing Applied Guaze, ABD and kerlix/Conform 7. Secured with Secretary/administrator) Signed: 01/14/2016 1:24:09 PM By: Curtis Sites Entered By: Curtis Sites on 01/14/2016 10:14:36 KELSHA, OLDER ([id number]) MARKEL, MERGENTHALER ([id number]) -------------------------------------------------------------------------------- Wound Assessment Details Patient Name: AISHANI, KALIS. Date of Service: 01/14/2016 9:15 AM Medical Record Number: [id number] Patient Account Number: [id number] Date of Birth/Sex: 06-04-1933 ([name]) Treating RN: Curtis Sites Primary Care Physician: [name] Other Clinician: Referring Physician: [name] Treating Physician/Extender: Rudene Re in Treatment: 4 Wound Status Wound Number: 9 Primary Arterial Insufficiency Ulcer Etiology:  Wound Location: Right Achilles Wound Open Wounding Event: Gradually Appeared Status: Date Acquired: 06/02/2015 Comorbid Arrhythmia, Congestive Heart Failure, Weeks Of Treatment: 4 History: Hypertension, Type II Diabetes Clustered Wound: No Pending Amputation On Presentation Photos Wound Measurements Length: (cm) 9.2 Width: (cm) 5 Depth: (cm) 0.3 Area: (cm) 36.128 Volume: (cm) 10.838 % Reduction in Area: -10.8% % Reduction in Volume: -10.8% Epithelialization: None Tunneling: No Undermining: No Wound Description Full Thickness Without Foul Odor Af Classification: Exposed Support Structures Due to Produ Diabetic Severity Grade 1 (Wagner): Wound Margin: Flat and Intact ter Cleansing: Yes ct Use: No Wound Bed Granulation Amount: Large (67-100%)  Exposed Structure Granulation Quality: Red Fascia Exposed: No Necrotic Amount: Small (1-33%) Fat Layer Exposed: No Necrotic Quality: Eschar, Adherent Slough Tendon Exposed: No BRENDI, MCCARROLL ([id number]) Muscle Exposed: No Joint Exposed: No Bone Exposed: No Limited to Skin Breakdown Periwound Skin Texture Texture Color No Abnormalities Noted: No No Abnormalities Noted: No Callus: No Atrophie Blanche: No Crepitus: No Cyanosis: No Excoriation: No Ecchymosis: No Fluctuance: No Erythema: Yes Friable: No Erythema Location: Circumferential Induration: No Hemosiderin Staining: No Localized Edema: No Mottled: No Rash: No Pallor: No Scarring: No Rubor: No Moisture Temperature / Pain No Abnormalities Noted: No Tenderness on Palpation: Yes Dry / Scaly: No Maceration: Yes Moist: Yes Wound Preparation Ulcer Cleansing: Rinsed/Irrigated with Saline Topical Anesthetic Applied: Other: lidocaine 4%, Treatment Notes Wound #9 (Right Achilles) 1. Cleansed with: Clean wound with Normal Saline 2. Anesthetic Topical Lidocaine 4% cream to wound bed prior to debridement 4. Dressing Applied: Aquacel Ag 5. Secondary Dressing Applied Guaze, ABD and kerlix/Conform 7. Secured with Secretary/administrator) Signed: 01/14/2016 1:24:09 PM By: Curtis Sites Entered By: Curtis Sites on 01/14/2016 10:15:02 [name] ([id number]) -------------------------------------------------------------------------------- Vitals Details Patient Name: [name] Date of Service: 01/14/2016 9:15 AM Medical Record Patient Account Number: [id number] Number: Treating RN: Anntionette, Madkins 1934/03/11 (82 y.o. Other Clinician: Date of Birth/Sex: Female) Treating ROBSON, MICHAEL Primary Care Physician: [name] Physician/Extender: G Referring Physician: Charolotte Capuchin in Treatment: 4 Vital Signs Time Taken: 09:26 Temperature (F): 98.4 Height (in): 65 Pulse  (bpm): 75 Weight (lbs): 160 Respiratory Rate (breaths/min): 18 Body Mass Index (BMI): 26.6 Blood Pressure (mmHg): 110/53 Reference Range: 80 - 120 mg / dl Electronic Signature(s) Signed: 01/14/2016 1:24:09 PM By: Curtis Sites Entered By: Curtis Sites on 01/14/2016 09:27:40

## 2016-01-21 ENCOUNTER — Ambulatory Visit
Admission: RE | Admit: 2016-01-21 | Discharge: 2016-01-21 | Disposition: A | Discharge status: Home / Self Care | Payer: Medicare Other | Source: Ambulatory Visit | Attending: Internal Medicine | Admitting: Internal Medicine

## 2016-01-21 ENCOUNTER — Other Ambulatory Visit: Payer: Self-pay | Admitting: Internal Medicine

## 2016-01-21 ENCOUNTER — Encounter: Payer: Medicare Other | Admitting: Internal Medicine

## 2016-01-21 DIAGNOSIS — X58XXXA Exposure to other specified factors, initial encounter: Secondary | ICD-10-CM | POA: Diagnosis not present

## 2016-01-21 DIAGNOSIS — B999 Unspecified infectious disease: Secondary | ICD-10-CM

## 2016-01-21 DIAGNOSIS — S91301A Unspecified open wound, right foot, initial encounter: Secondary | ICD-10-CM | POA: Insufficient documentation

## 2016-01-21 DIAGNOSIS — E11621 Type 2 diabetes mellitus with foot ulcer: Secondary | ICD-10-CM | POA: Diagnosis not present

## 2016-01-22 ENCOUNTER — Other Ambulatory Visit
Admission: RE | Admit: 2016-01-22 | Discharge: 2016-01-22 | Disposition: A | Discharge status: Home / Self Care | Payer: Medicare Other | Source: Ambulatory Visit | Attending: Internal Medicine | Admitting: Internal Medicine

## 2016-01-22 DIAGNOSIS — M65171 Other infective (teno)synovitis, right ankle and foot: Secondary | ICD-10-CM | POA: Insufficient documentation

## 2016-01-22 NOTE — Progress Notes (Signed)
[name], [name] ([id number]) Visit Report for 01/21/2016 Chief Complaint Document Details Patient Name: BROCK, MOKRY. Date of Service: 01/21/2016 10:15 AM Medical Record Patient Account Number: [id number] Number: Treating RN: Makenly, Larabee 1933-11-10 (82 y.o. Other Clinician: Date of Birth/Sex: Female) Treating ROBSON, MICHAEL Primary Care Physician/Extender: Rex Kras, Heidi Physician: Referring Physician: Charolotte Capuchin in Treatment: 5 Information Obtained from: Patient Chief Complaint Patients presents for treatment of an open diabetic ulcer, arterial ulcer and pressure ulcers to the right lower extremities which is a complex etiology for the last several months. He also has ulcers on her abdomen in the left lower quadrant and left suprapubic area which she's had for about 4 months. 12/17/15; patient returns to clinic apparently referred back from vascular surgery for ongoing wound care. Electronic Signature(s) Signed: 01/22/2016 7:31:48 AM By: Baltazar Najjar MD Entered By: Baltazar Najjar on 01/21/2016 13:44:14 [name] ([id number]) -------------------------------------------------------------------------------- Debridement Details Patient Name: [name] Date of Service: 01/21/2016 10:15 AM Medical Record Patient Account Number: [id number] Number: Treating RN: Mairim, Bade 1934/01/16 (82 y.o. Other Clinician: Date of Birth/Sex: Female) Treating ROBSON, MICHAEL Primary Care Physician/Extender: Rex Kras, Heidi Physician: Referring Physician: Charolotte Capuchin in Treatment: 5 Debridement Performed for Wound #13 Left Calcaneus Assessment: Performed By: Physician Maxwell Caul, MD Debridement: Debridement Pre-procedure Yes - 10:54 Verification/Time Out Taken: Start Time: 10:54 Pain Control: Lidocaine 4% Topical Solution Level: Skin/Subcutaneous Tissue Total Area Debrided (L x 0.7 (cm) x 0.5 (cm) = 0.35  (cm) W): Tissue and other Viable, Non-Viable, Eschar, Fibrin/Slough, Subcutaneous material debrided: Instrument: Blade, Forceps Bleeding: Minimum Hemostasis Achieved: Pressure End Time: 10:56 Procedural Pain: 0 Post Procedural Pain: 0 Response to Treatment: Procedure was tolerated well Post Debridement Measurements of Total Wound Length: (cm) 0.7 Width: (cm) 0.5 Depth: (cm) 0.2 Volume: (cm) 0.055 Character of Wound/Ulcer Post Requires Further Debridement Debridement: Severity of Tissue Post Debridement: Limited to breakdown of skin Post Procedure Diagnosis Same as Pre-procedure Electronic Signature(s) Signed: 01/21/2016 4:39:07 PM By: Neldon Labella ([id number]) Signed: 01/22/2016 7:31:48 AM By: Baltazar Najjar MD Entered By: Baltazar Najjar on 01/21/2016 13:43:00 [name] ([id number]) -------------------------------------------------------------------------------- Debridement Details Patient Name: [name]. Date of Service: 01/21/2016 10:15 AM Medical Record Patient Account Number: [id number] Number: Treating RN: Hiyab, Nhem [birth year] (82 y.o. Other Clinician: Date of Birth/Sex: Female) Treating ROBSON, MICHAEL Primary Care Physician/Extender: Rex Kras, Heidi Physician: Referring Physician: Charolotte Capuchin in Treatment: 5 Debridement Performed for Wound #14 Right Amputation Site - Digit Assessment: Performed By: Physician Maxwell Caul, MD Debridement: Debridement Pre-procedure Yes - 10:56 Verification/Time Out Taken: Start Time: 10:56 Pain Control: Lidocaine 4% Topical Solution Level: Skin/Subcutaneous Tissue Total Area Debrided (L x 0.9 (cm) x 0.5 (cm) = 0.45 (cm) W): Tissue and other Viable, Non-Viable, Eschar, Fibrin/Slough, Subcutaneous material debrided: Instrument: Curette Bleeding: Minimum Hemostasis Achieved: Pressure End Time: 10:58 Procedural Pain: 0 Post Procedural Pain: 0 Response  to Treatment: Procedure was tolerated well Post Debridement Measurements of Total Wound Length: (cm) 0.9 Width: (cm) 0.5 Depth: (cm) 0.1 Volume: (cm) 0.035 Character of Wound/Ulcer Post Requires Further Debridement Debridement: Severity of Tissue Post Debridement: Limited to breakdown of skin Post Procedure Diagnosis Same as Pre-procedure Electronic Signature(s) Signed: 01/21/2016 4:39:07 PM By: Neldon Labella ([id number]) Signed: 01/22/2016 7:31:48 AM By: Baltazar Najjar MD Entered By: Baltazar Najjar on 01/21/2016 13:43:27 [name] ([id number]) -------------------------------------------------------------------------------- Debridement Details Patient Name: [name]. Date of Service: 01/21/2016 10:15 AM Medical Record  Patient Account Number: [id number] Number: Treating RN: Kathia, C[place]ton 1933/09/05 (82 y.o. Other Clinician: Date of Birth/Sex: Female) Treating ROBSON, MICHAEL Primary Care Physician/Extender: Rex Kras, Heidi Physician: Referring Physician: Charolotte Capuchin in Treatment: 5 Debridement Performed for Wound #9 Right Achilles Assessment: Performed By: Physician Maxwell Caul, MD Debridement: Debridement Pre-procedure Yes - 10:48 Verification/Time Out Taken: Start Time: 10:49 Pain Control: Lidocaine 4% Topical Solution Level: Skin/Subcutaneous Tissue Total Area Debrided (L x 9.5 (cm) x 4.9 (cm) = 46.55 (cm) W): Tissue and other Viable, Non-Viable, Eschar, Fibrin/Slough, Subcutaneous material debrided: Instrument: Blade, Forceps Specimen: Swab Number of Specimens 1 Taken: Bleeding: Minimum Hemostasis Achieved: Pressure End Time: 10:54 Procedural Pain: 0 Post Procedural Pain: 0 Response to Treatment: Procedure was tolerated well Post Debridement Measurements of Total Wound Length: (cm) 9.5 Width: (cm) 4.9 Depth: (cm) 1 Volume: (cm) 36.56 Character of Wound/Ulcer Post Requires Further  Debridement Debridement: Severity of Tissue Post Debridement: Necrosis of muscle Post Procedure Diagnosis Same as Pre-procedure [name], [name] ([id number]) Electronic Signature(s) Signed: 01/21/2016 4:39:07 PM By: Curtis Sites Signed: 01/22/2016 7:31:48 AM By: Baltazar Najjar MD Entered By: Baltazar Najjar on 01/21/2016 13:43:44 [name] ([id number]) -------------------------------------------------------------------------------- HPI Details Patient Name: [name] Date of Service: 01/21/2016 10:15 AM Medical Record Patient Account Number: [id number] Number: Treating RN: Donyell, Carrell [birth year] (82 y.o. Other Clinician: Date of Birth/Sex: Female) Treating ROBSON, MICHAEL Primary Care Physician/Extender: Rex Kras, Heidi Physician: Referring Physician: Charolotte Capuchin in Treatment: 5 History of Present Illness Location: several wounds on the right lower extremity including her right healed right posterior ankle and right lower third of the leg. She also has wounds on her left lower quadrant of the abdomen and the suprapubic area. Quality: Patient reports experiencing a sharp pain to affected area(s). Severity: Patient states wound are getting worse. Duration: Patient has had the wound for > 3 months prior to seeking treatment at the wound center Timing: Pain in wound is Intermittent (comes and goes Context: The wound appeared gradually over time Modifying Factors: Other treatment(s) tried include:he simply been admitted to the hospital 2 weeks ago and has had procedures done on her right lower extremity and had a blockage which we are trying to get some notes Associated Signs and Symptoms: Patient reports having increase discharge. HPI Description: 82 year old patient who is known to be diabetic, was referred to [place] by Dr. Gavin Potters for a right heel ulceration which she's had for a while. She was recently in hospital for a pneumonia and at  that time and got delirious and was disoriented and sometime during this time developed a stage II ulcer on her right heel. Her past medical history is significant for bilateral pneumonia which was treated with injectable antibiotics and then to oral Levaquin which he has completed. She also has acute on chronic diastolic CHF, acute on chronic respiratory failure, end-stage renal disease on hemodialysis, atrial fibrillation, recent stroke, diabetes mellitus. The patient and her son are poor historians but from what I understand she was admitted to the hospital with an acute vascular compromise of her right lower extremity and Dr. Wyn Quaker has done a surgical procedure and we are trying to obtain these notes. There are also some vascular workup done and we will try and obtain these notes. the injury to the left lower quadrant of abdomen and the suprapubic area have been there due to a bruise and have been there for several months and no intervention has been done. 10/11/2015 -- on review  of the electronics records it was noted that the patient was admitted to the hospital on 09/14/2015 with peripheral vascular disease with claudication, end-stage renal disease, pressure ulcer, chronic atrial fibrillation. She was seen by Dr. Wyn Quaker who did her right lower extremity angiogram , angioplasty of the right anterior tibial artery and thrombolysis with TPA of the right popliteal artery, and thrombectomy. She was seen by Dr. Wyn Quaker during this past week and he was pleased with the progress. He did say that if he took her to the operating room for any procedure he would debride the abdominal wound under anesthesia. She was also seen by Dr. Ether Griffins the podiatrist who thought that she may lose her right fourth toe at some stage may need an amputation of this. [name], [name] ([id number]) 10/21/2015 --patient known to Dr. Wyn Quaker and his last office visit from 10/04/2015 has been reviewed. 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 She also had a persistent ulceration of the right heel and markedly this area and a right third and fourth toe and a small scab on the calf but these are dry and seemed to be improving. Patient also has a left carotid endarterectomy and multiple interventions to a right brachiocephalic AV fistula. After the visit he had recommended noninvasive studies to recheck her revascularization. He was off the impression that she would likely lose the right fourth toe and the third toe was likely to heal. He was concerned about underlying muscle necrosis on her right heel and midfoot. 11/01/2015 -- an echo done in January of this year showed her left ventricular ejection fraction to be about 50-55%. The patient was seen by the PA and Dr. Driscilla Grammes office and the plan was to take her to the operating room soon to have a debridement under anesthesia for the abdominal wall wound, the Achilles tendon on the right leg and amputation of the right fourth toe. The daughter and the patient do not feel that they would be able to undergo hyperbaric oxygen therapy 5 days a week for 6 weeks. 12/17/15; this is a medically complex woman who I note was recently in this clinic however I was not involved with her care. She returns today with multiple wounds; a) she has a wound in the mid abdomen that is been there since March of this year. I note that she is been to the overall for debridement recently. The exact etiology of this wound is not really clear b) left lower quadrant abdominal wound had some sanguinous drainage when she came in here. The patient fell in January and thinks this may have been secondary to a hematoma. c): The patient has 3 wounds on her right leg including a small wound on the right mid calf, a large area over the Achilles which currently has a wound VAC for the last 6  weeks, also a smaller wound on the distal part of the right heel. As far as I understand most of these wounds are currently been dressed with's calcium alginate. According to her daughter the Achilles wound under the wound VAC is doing well d) the patient is had an amputation of her left fifth toe in January and the right fourth toe 6 weeks ago secondary to diabetic PAD e) the patient has chronic renal failure on dialysis for the last 2 years secondary to type 2 diabetes on insulin. The daughter's knowledge there is been  no biopsy of the abdominal wounds given their current appearance and lack of undefined etiology at have to wonder about calciphylaxis. 12/18/15:Addendum; I [facility] I can see no relevant x-rays of the right heel. I note her arteriogram and revascularization of her right lower extremity in April 2017. She had debridement of both abdominal wounds and the right heel and Achilles wound on 11/07/15 at which time she had a right fourth toe ray amputation. The abdominal wounds were debridement again on 6/29. I do not see any relevant pathology of these abdominal wounds 12/24/15; culture I did of the drainage from the midline abdominal wound last week showed both Proteus and ampicillin sensitive enterococcus. I've given her a course of Augmentin adjusted on dialysis days. She has no specific complaints today. Been using Santyl to the abdominal wounds in the right leg wound and the wound VAC on the right Achilles which was initially prescribed by Dr. dew 12/31/15; I have done two punch biopsies of the large midline abdominal. 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 May have been a trauma component of the one on the left lower quadrant however the midline wound had no such history. She has a large area on the right Achilles heel with a wound VAC prescribed by Dr. dew. A small wound on the right anterior leg.Marland Kitchen UNFORTUNATELY she has 2 new wounds today. One on the left heel which is  probably a pressure area. As well her previous amputation site of her right fourth toe has dehisced and now has a small wound with significant depth at the amputation site. [name], [name] ([id number]) 01/14/2016 -- she returns after 2 weeks and had had a punch biopsy of abdominal wound done the last visit -- had a biopsy of her midline abdominal wound done and the Pathology diagnosis is that of ulceration, necrosis and inflammation and negative for dysplasia and malignancy. 01/21/16. 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 In the meantime she has new wounds of the left heel the right fourth toe amputation site is opened up. The back is stopped to the right heel area. Electronic Signature(s) Signed: 01/22/2016 7:31:48 AM By: Baltazar Najjar MD Entered By: Baltazar Najjar on 01/21/2016 13:45:25 [name] ([id number]) -------------------------------------------------------------------------------- Physical Exam Details Patient Name: [name] Date of Service: 01/21/2016 10:15 AM Medical Record Patient Account Number: [id number] Number: Treating RN: Karan, Inclan 1933-06-04 (82 y.o. Other Clinician: Date of Birth/Sex: Female) Treating ROBSON, MICHAEL Primary Care Physician/Extender: Rex Kras, Heidi Physician: Referring Physician: Charolotte Capuchin in Treatment: 5 Notes Wound exam; the midline abdominal and left lower abdominal wounds actually look quite good these do not appear to be the problem currently. She has a new pressure ulcer on the left heel, the right fourth toe amputation site is open. However the most problematic area here is at the base of her extensive wound over the right Achilles area. This is necrotic and deeply probing. I have cultured this area. There is an older but no surrounding soft tissue crepitus or erythema Electronic Signature(s) Signed: 01/22/2016 7:31:48  AM By: Baltazar Najjar MD Entered By: Baltazar Najjar on 01/21/2016 13:47:17 [name] ([id number]) -------------------------------------------------------------------------------- Physician Orders Details Patient Name: [name]. Date of Service: 01/21/2016 10:15 AM Medical Record Patient Account Number: [id number] Number: Treating RN: Eimi, Viney 29-Jun-1933 (82 y.o. Other Clinician: Date of Birth/Sex: Female) Treating ROBSON, MICHAEL Primary Care Physician/Extender: Rex Kras, Heidi Physician: Referring Physician: Gavin Potters,  Heidi Weeks in Treatment: 5 Verbal / Phone Orders: Yes Clinician: Curtis Sitesorthy, Joanna Read Back and Verified: Yes Diagnosis Coding Wound Cleansing Wound #10 Right Calcaneus o Clean wound with Normal Saline. Wound #11 Midline Abdomen - Lower Quadrant o Clean wound with Normal Saline. Wound #12 Left Abdomen - Lower Quadrant o Clean wound with Normal Saline. Wound #13 Left Calcaneus o Clean wound with Normal Saline. Wound #14 Right Amputation Site - Digit o Clean wound with Normal Saline. Wound #8 Right,Lateral Lower Leg o Clean wound with Normal Saline. Wound #9 Right Achilles o Clean wound with Normal Saline. Anesthetic Wound #10 Right Calcaneus o Topical Lidocaine 4% cream applied to wound bed prior to debridement - in Wound Clinic Wound #11 Midline Abdomen - Lower Quadrant o Topical Lidocaine 4% cream applied to wound bed prior to debridement - in Wound Clinic Wound #12 Left Abdomen - Lower Quadrant o Topical Lidocaine 4% cream applied to wound bed prior to debridement - in Wound Clinic Wound #13 Left Calcaneus o Topical Lidocaine 4% cream applied to wound bed prior to debridement - in Wound Clinic Natalie RimaHOPKINS, Tajanay Y. ([id number]) Wound #14 Right Amputation Site - Digit o Topical Lidocaine 4% cream applied to wound bed prior to debridement - in Wound Clinic Wound #8 Right,Lateral Lower Leg o Topical  Lidocaine 4% cream applied to wound bed prior to debridement - in Wound Clinic Wound #9 Right Achilles o Topical Lidocaine 4% cream applied to wound bed prior to debridement - in Wound Clinic Skin Barriers/Peri-Wound Care Wound #10 Right Calcaneus o Skin Prep Wound #11 Midline Abdomen - Lower Quadrant o Skin Prep Wound #12 Left Abdomen - Lower Quadrant o Skin Prep Wound #13 Left Calcaneus o Skin Prep Wound #14 Right Amputation Site - Digit o Skin Prep Wound #8 Right,Lateral Lower Leg o Skin Prep Wound #9 Right Achilles o Skin Prep Primary Wound Dressing Wound #10 Right Calcaneus o Aquacel Ag - HHRN to please order his for patient Wound #11 Midline Abdomen - Lower Quadrant o Aquacel Ag - HHRN to please order his for patient Wound #12 Left Abdomen - Lower Quadrant o Aquacel Ag - HHRN to please order his for patient Wound #13 Left Calcaneus o Aquacel Ag - HHRN to please order his for patient Wound #14 Right Amputation Site - Digit o Aquacel Ag - HHRN to please order his for patient Wound #8 Right,Lateral Lower Leg Natalie RimaHOPKINS, Tricha Y. ([id number]) o Aquacel Ag - HHRN to please order his for patient Wound #9 Right Achilles o Aquacel Ag - HHRN to please order his for patient Secondary Dressing Wound #10 Right Calcaneus o ABD and Kerlix/Conform o Dry Gauze - secure with tape Wound #11 Midline Abdomen - Lower Quadrant o ABD and Kerlix/Conform o Dry Gauze - secure with tape Wound #12 Left Abdomen - Lower Quadrant o ABD and Kerlix/Conform o Dry Gauze - secure with tape Wound #13 Left Calcaneus o ABD and Kerlix/Conform o Dry Gauze - secure with tape Wound #14 Right Amputation Site - Digit o ABD and Kerlix/Conform o Dry Gauze - secure with tape Wound #8 Right,Lateral Lower Leg o ABD and Kerlix/Conform o Dry Gauze - secure with tape Wound #9 Right Achilles o ABD and Kerlix/Conform o Dry Gauze - secure with tape Dressing  Change Frequency Wound #10 Right Calcaneus o Change Dressing Monday, Wednesday, Friday - or as needed Wound #11 Midline Abdomen - Lower Quadrant o Change Dressing Monday, Wednesday, Friday - or as needed Wound #12 Left Abdomen - Lower  Quadrant o Change Dressing Monday, Wednesday, Friday - or as needed Wound #13 Left Calcaneus o Change Dressing Monday, Wednesday, Friday - or as needed Wound #14 Right Amputation Site - Digit CIARRAH, RAE ([id number]) o Change Dressing Monday, Wednesday, Friday - or as needed Wound #8 Right,Lateral Lower Leg o Change Dressing Monday, Wednesday, Friday - or as needed Wound #9 Right Achilles o Change Dressing Monday, Wednesday, Friday - or as needed Follow-up Appointments Wound #10 Right Calcaneus o Return Appointment in 1 week. Wound #11 Midline Abdomen - Lower Quadrant o Return Appointment in 1 week. Wound #12 Left Abdomen - Lower Quadrant o Return Appointment in 1 week. Wound #13 Left Calcaneus o Return Appointment in 1 week. Wound #14 Right Amputation Site - Digit o Return Appointment in 1 week. Wound #8 Right,Lateral Lower Leg o Return Appointment in 1 week. Wound #9 Right Achilles o Return Appointment in 1 week. Home Health Wound #10 Right Calcaneus o Continue Home Health Visits - [facility] to order appropriate wound care supplies for patient o Home Health Nurse may visit PRN to address patientos wound care needs. o FACE TO FACE ENCOUNTER: MEDICARE and MEDICAID PATIENTS: I certify that this patient is under my care and that I had a face-to-face encounter that meets the physician face-to-face encounter requirements with this patient on this date. The encounter with the patient was in whole or in part for the following MEDICAL CONDITION: (primary reason for Home Healthcare) MEDICAL NECESSITY: I certify, that based on my findings, NURSING services are a medically necessary home health service. HOME  BOUND STATUS: I certify that my clinical findings support that this patient is homebound (i.e., Due to illness or injury, pt requires aid of supportive devices such as crutches, cane, wheelchairs, walkers, the use of special transportation or the assistance of another person to leave their place of residence. There is a normal inability to leave the home and doing so requires considerable and taxing effort. Other absences are for medical reasons / religious services and are infrequent or of short duration when for other reasons). TOIA, MICALE ([id number]) o If current dressing causes regression in wound condition, may D/C ordered dressing product/s and apply Normal Saline Moist Dressing daily until next Wound Healing Center / Other MD appointment. Notify Wound Healing Center of regression in wound condition at [phone]. o Please direct any NON-WOUND related issues/requests for orders to patient's Primary Care Physician Wound #11 Midline Abdomen - Lower Quadrant o Continue Home Health Visits - [facility] to order appropriate wound care supplies for patient o Home Health Nurse may visit PRN to address patientos wound care needs. o FACE TO FACE ENCOUNTER: MEDICARE and MEDICAID PATIENTS: I certify that this patient is under my care and that I had a face-to-face encounter that meets the physician face-to-face encounter requirements with this patient on this date. The encounter with the patient was in whole or in part for the following MEDICAL CONDITION: (primary reason for Home Healthcare) MEDICAL NECESSITY: I certify, that based on my findings, NURSING services are a medically necessary home health service. HOME BOUND STATUS: I certify that my clinical findings support that this patient is homebound (i.e., Due to illness or injury, pt requires aid of supportive devices such as crutches, cane, wheelchairs, walkers, the use of special transportation or the assistance of  another person to leave their place of residence. There is a normal inability to leave the home and doing so requires considerable and taxing effort. Other  absences are for medical reasons / religious services and are infrequent or of short duration when for other reasons). o If current dressing causes regression in wound condition, may D/C ordered dressing product/s and apply Normal Saline Moist Dressing daily until next Wound Healing Center / Other MD appointment. Notify Wound Healing Center of regression in wound condition at [phone]. o Please direct any NON-WOUND related issues/requests for orders to patient's Primary Care Physician Wound #12 Left Abdomen - Lower Quadrant o Continue Home Health Visits - [facility] to order appropriate wound care supplies for patient o Home Health Nurse may visit PRN to address patientos wound care needs. o FACE TO FACE ENCOUNTER: MEDICARE and MEDICAID PATIENTS: I certify that this patient is under my care and that I had a face-to-face encounter that meets the physician face-to-face encounter requirements with this patient on this date. The encounter with the patient was in whole or in part for the following MEDICAL CONDITION: (primary reason for Home Healthcare) MEDICAL NECESSITY: I certify, that based on my findings, NURSING services are a medically necessary home health service. HOME BOUND STATUS: I certify that my clinical findings support that this patient is homebound (i.e., Due to illness or injury, pt requires aid of supportive devices such as crutches, cane, wheelchairs, walkers, the use of special transportation or the assistance of another person to leave their place of residence. There is a normal inability to leave the home and doing so requires considerable and taxing effort. Other absences are for medical reasons / religious services and are infrequent or of short duration when for other reasons). o If current dressing  causes regression in wound condition, may D/C ordered dressing product/s and apply Normal Saline Moist Dressing daily until next Wound Healing Center / Other MD appointment. Notify Wound Healing Center of regression in wound condition at [phone]. o Please direct any NON-WOUND related issues/requests for orders to patient's Primary Care Physician [name], [name] ([id number]) Wound #13 Left Calcaneus o [facility] - [facility] to order appropriate wound care supplies for patient o Home Health Nurse may visit PRN to address patientos wound care needs. o FACE TO FACE ENCOUNTER: MEDICARE and MEDICAID PATIENTS: I certify that this patient is under my care and that I had a face-to-face encounter that meets the physician face-to-face encounter requirements with this patient on this date. The encounter with the patient was in whole or in part for the following MEDICAL CONDITION: (primary reason for Home Healthcare) MEDICAL NECESSITY: I certify, that based on my findings, NURSING services are a medically necessary home health service. HOME BOUND STATUS: I certify that my clinical findings support that this patient is homebound (i.e., Due to illness or injury, pt requires aid of supportive devices such as crutches, cane, wheelchairs, walkers, the use of special transportation or the assistance of another person to leave their place of residence. There is a normal inability to leave the home and doing so requires considerable and taxing effort. Other absences are for medical reasons / religious services and are infrequent or of short duration when for other reasons). o If current dressing causes regression in wound condition, may D/C ordered dressing product/s and apply Normal Saline Moist Dressing daily until next Wound Healing Center / Other MD appointment. Notify Wound Healing Center of regression in wound condition at [phone]. o Please direct any NON-WOUND  related issues/requests for orders to patient's Primary Care Physician Wound #14 Right Amputation Site - Digit o Continue Home Health  Visits - [facility] to order appropriate wound care supplies for patient o Home Health Nurse may visit PRN to address patientos wound care needs. o FACE TO FACE ENCOUNTER: MEDICARE and MEDICAID PATIENTS: I certify that this patient is under my care and that I had a face-to-face encounter that meets the physician face-to-face encounter requirements with this patient on this date. The encounter with the patient was in whole or in part for the following MEDICAL CONDITION: (primary reason for Home Healthcare) MEDICAL NECESSITY: I certify, that based on my findings, NURSING services are a medically necessary home health service. HOME BOUND STATUS: I certify that my clinical findings support that this patient is homebound (i.e., Due to illness or injury, pt requires aid of supportive devices such as crutches, cane, wheelchairs, walkers, the use of special transportation or the assistance of another person to leave their place of residence. There is a normal inability to leave the home and doing so requires considerable and taxing effort. Other absences are for medical reasons / religious services and are infrequent or of short duration when for other reasons). o If current dressing causes regression in wound condition, may D/C ordered dressing product/s and apply Normal Saline Moist Dressing daily until next Wound Healing Center / Other MD appointment. Notify Wound Healing Center of regression in wound condition at [phone]. o Please direct any NON-WOUND related issues/requests for orders to patient's Primary Care Physician Wound #8 Right,Lateral Lower Leg o [facility] Visits - [facility] to order appropriate wound care supplies for patient o Home Health Nurse may visit PRN to address patientos wound care needs. JUDETH, GILLES ([id number]) o FACE TO FACE ENCOUNTER: MEDICARE and MEDICAID PATIENTS: I certify that this patient is under my care and that I had a face-to-face encounter that meets the physician face-to-face encounter requirements with this patient on this date. The encounter with the patient was in whole or in part for the following MEDICAL CONDITION: (primary reason for Home Healthcare) MEDICAL NECESSITY: I certify, that based on my findings, NURSING services are a medically necessary home health service. HOME BOUND STATUS: I certify that my clinical findings support that this patient is homebound (i.e., Due to illness or injury, pt requires aid of supportive devices such as crutches, cane, wheelchairs, walkers, the use of special transportation or the assistance of another person to leave their place of residence. There is a normal inability to leave the home and doing so requires considerable and taxing effort. Other absences are for medical reasons / religious services and are infrequent or of short duration when for other reasons). o If current dressing causes regression in wound condition, may D/C ordered dressing product/s and apply Normal Saline Moist Dressing daily until next Wound Healing Center / Other MD appointment. Notify Wound Healing Center of regression in wound condition at [phone]. o Please direct any NON-WOUND related issues/requests for orders to patient's Primary Care Physician Wound #9 Right Achilles o [facility] Visits - [facility] to order appropriate wound care supplies for patient o Home Health Nurse may visit PRN to address patientos wound care needs. o FACE TO FACE ENCOUNTER: MEDICARE and MEDICAID PATIENTS: I certify that this patient is under my care and that I had a face-to-face encounter that meets the physician face-to-face encounter requirements with this patient on this date. The encounter with the patient was in whole or in part for  the following MEDICAL CONDITION: (primary reason for Home Healthcare) MEDICAL NECESSITY:  I certify, that based on my findings, NURSING services are a medically necessary home health service. HOME BOUND STATUS: I certify that my clinical findings support that this patient is homebound (i.e., Due to illness or injury, pt requires aid of supportive devices such as crutches, cane, wheelchairs, walkers, the use of special transportation or the assistance of another person to leave their place of residence. There is a normal inability to leave the home and doing so requires considerable and taxing effort. Other absences are for medical reasons / religious services and are infrequent or of short duration when for other reasons). o If current dressing causes regression in wound condition, may D/C ordered dressing product/s and apply Normal Saline Moist Dressing daily until next Wound Healing Center / Other MD appointment. Notify Wound Healing Center of regression in wound condition at [phone]. o Please direct any NON-WOUND related issues/requests for orders to patient's Primary Care Physician Laboratory o Bacteria identified in Wound by Culture (MICRO) - r achiles oooo LOINC Code: [id number] oooo Convenience Name: Wound culture routine Radiology o X-ray, other - r calcaneous LANAYA, BENNIS ([id number]) Electronic Signature(s) Signed: 01/21/2016 4:39:07 PM By: Curtis Sites Signed: 01/22/2016 7:31:48 AM By: Baltazar Najjar MD Entered By: Curtis Sites on 01/21/2016 11:26:07 [name] ([id number]) -------------------------------------------------------------------------------- Problem List Details Patient Name: DARYL, QUIROS. Date of Service: 01/21/2016 10:15 AM Medical Record Patient Account Number: [id number] Number: Treating RN: Paw, Karstens February 29, 1934 (82 y.o. Other Clinician: Date of Birth/Sex: Female) Treating ROBSON, MICHAEL Primary Care  Physician/Extender: Rex Kras, Heidi Physician: Referring Physician: Charolotte Capuchin in Treatment: 5 Active Problems ICD-10 Encounter Code Description Active Date Diagnosis E11.621 Type 2 diabetes mellitus with foot ulcer 12/17/2015 Yes S31.104A Unspecified open wound of abdominal wall, left lower 12/17/2015 Yes quadrant without penetration into peritoneal cavity, initial encounter E11.51 Type 2 diabetes mellitus with diabetic peripheral 12/17/2015 Yes angiopathy without gangrene Inactive Problems Resolved Problems Electronic Signature(s) Signed: 01/22/2016 7:31:48 AM By: Baltazar Najjar MD Entered By: Baltazar Najjar on 01/21/2016 13:42:35 [name] ([id number]) -------------------------------------------------------------------------------- Progress Note Details Patient Name: [name] Date of Service: 01/21/2016 10:15 AM Medical Record Patient Account Number: [id number] Number: Treating RN: Bodhi, Stenglein [birth year] (82 y.o. Other Clinician: Date of Birth/Sex: Female) Treating ROBSON, MICHAEL Primary Care Physician/Extender: Rex Kras, Heidi Physician: Referring Physician: Charolotte Capuchin in Treatment: 5 Subjective Chief Complaint Information obtained from Patient Patients presents for treatment of an open diabetic ulcer, arterial ulcer and pressure ulcers to the right lower extremities which is a complex etiology for the last several months. He also has ulcers on her abdomen in the left lower quadrant and left suprapubic area which she's had for about 4 months. 12/17/15; patient returns to clinic apparently referred back from vascular surgery for ongoing wound care. History of Present Illness (HPI) The following HPI elements were documented for the patient's wound: Location: several wounds on the right lower extremity including her right healed right posterior ankle and right lower third of the leg. She also has wounds on her left lower  quadrant of the abdomen and the suprapubic area. Quality: Patient reports experiencing a sharp pain to affected area(s). Severity: Patient states wound are getting worse. Duration: Patient has had the wound for > 3 months prior to seeking treatment at the wound center Timing: Pain in wound is Intermittent (comes and goes Context: The wound appeared gradually over time Modifying Factors: Other treatment(s) tried include:he simply been admitted to the hospital 2 weeks ago and has  had procedures done on her right lower extremity and had a blockage which we are trying to get some notes Associated Signs and Symptoms: Patient reports having increase discharge. 82 year old patient who is known to be diabetic, was referred to [place] by Dr. Gavin Potters for a right heel ulceration which she's had for a while. She was recently in hospital for a pneumonia and at that time and got delirious and was disoriented and sometime during this time developed a stage II ulcer on her right heel. Her past medical history is significant for bilateral pneumonia which was treated with injectable antibiotics and then to oral Levaquin which he has completed. She also has acute on chronic diastolic CHF, acute on chronic respiratory failure, end-stage renal disease on hemodialysis, atrial fibrillation, recent stroke, diabetes mellitus. The patient and her son are poor historians but from what I understand she was admitted to the hospital with an acute vascular compromise of her right lower extremity and Dr. Wyn Quaker has done a surgical procedure and we are trying to obtain these notes. There are also some vascular workup done and we will try and obtain these notes. the injury to the left lower quadrant of abdomen and the suprapubic area have been there due to a bruise and have been there for several months and no intervention has been done. [name], [name] ([id number]) 10/11/2015 -- on review of the electronics records it was noted  that the patient was admitted to the hospital on 09/14/2015 with peripheral vascular disease with claudication, end-stage renal disease, pressure ulcer, chronic atrial fibrillation. She was seen by Dr. Wyn Quaker who did her right lower extremity angiogram , angioplasty of the right anterior tibial artery and thrombolysis with TPA of the right popliteal artery, and thrombectomy. She was seen by Dr. Wyn Quaker during this past week and he was pleased with the progress. He did say that if he took her to the operating room for any procedure he would debride the abdominal wound under anesthesia. She was also seen by Dr. Ether Griffins the podiatrist who thought that she may lose her right fourth toe at some stage may need an amputation of this. 10/21/2015 --patient known to Dr. Wyn Quaker and his last office visit from 10/04/2015 has been reviewed. 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 She also had a persistent ulceration of the right heel and markedly this area and a right third and fourth toe and a small scab on the calf but these are dry and seemed to be improving. Patient also has a left carotid endarterectomy and multiple interventions to a right brachiocephalic AV fistula. After the visit he had recommended noninvasive studies to recheck her revascularization. He was off the impression that she would likely lose the right fourth toe and the third toe was likely to heal. He was concerned about underlying muscle necrosis on her right heel and midfoot. 11/01/2015 -- an echo done in January of this year showed her left ventricular ejection fraction to be about 50-55%. The patient was seen by the PA and Dr. Driscilla Grammes office and the plan was to take her to the operating room soon to have a debridement under anesthesia for the abdominal wall wound, the Achilles tendon on the right leg and amputation of the  right fourth toe. The daughter and the patient do not feel that they would be able to undergo hyperbaric oxygen therapy 5 days a week for  6 weeks. 12/17/15; this is a medically complex woman who I note was recently in this clinic however I was not involved with her care. She returns today with multiple wounds; a) she has a wound in the mid abdomen that is been there since March of this year. I note that she is been to the overall for debridement recently. The exact etiology of this wound is not really clear b) left lower quadrant abdominal wound had some sanguinous drainage when she came in here. The patient fell in January and thinks this may have been secondary to a hematoma. c): The patient has 3 wounds on her right leg including a small wound on the right mid calf, a large area over the Achilles which currently has a wound VAC for the last 6 weeks, also a smaller wound on the distal part of the right heel. As far as I understand most of these wounds are currently been dressed with's calcium alginate. According to her daughter the Achilles wound under the wound VAC is doing well d) the patient is had an amputation of her left fifth toe in January and the right fourth toe 6 weeks ago secondary to diabetic PAD e) the patient has chronic renal failure on dialysis for the last 2 years secondary to type 2 diabetes on insulin. The daughter's knowledge there is been no biopsy of the abdominal wounds given their current appearance and lack of undefined etiology at have to wonder about calciphylaxis. 12/18/15:Addendum; I [facility] I can see no relevant x-rays of the right heel. I note her arteriogram and revascularization of her right lower extremity in April 2017. She had debridement of both abdominal wounds and the right heel and Achilles wound on 11/07/15 at which time she had a right fourth toe ray amputation. The abdominal wounds were debridement again on 6/29. I do not see  any [name], [name] ([id number]) relevant pathology of these abdominal wounds 12/24/15; culture I did of the drainage from the midline abdominal wound last week showed both Proteus and ampicillin sensitive enterococcus. I've given her a course of Augmentin adjusted on dialysis days. She has no specific complaints today. Been using Santyl to the abdominal wounds in the right leg wound and the wound VAC on the right Achilles which was initially prescribed by Dr. dew 12/31/15; I have done two punch biopsies of the large midline abdominal. 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 May have been a trauma component of the one on the left lower quadrant however the midline wound had no such history. She has a large area on the right Achilles heel with a wound VAC prescribed by Dr. dew. A small wound on the right anterior leg.Marland Kitchen UNFORTUNATELY she has 2 new wounds today. One on the left heel which is probably a pressure area. As well her previous amputation site of her right fourth toe has dehisced and now has a small wound with significant depth at the amputation site. 01/14/2016 -- she returns after 2 weeks and had had a punch biopsy of abdominal wound done the last visit -- had a biopsy of her midline abdominal wound done and the Pathology diagnosis is that of ulceration, necrosis and inflammation and negative for dysplasia and malignancy. 01/21/16. 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 In the meantime she has new wounds of the left heel the right fourth toe amputation site is opened up. The back is stopped to the right  heel area. Objective Constitutional Vitals Time Taken: 10:06 AM, Height: 65 in, Weight: 160 lbs, BMI: 26.6, Temperature: 98.1 F, Pulse: 72 bpm, Respiratory Rate: 18 breaths/min, Blood Pressure: 112/47 mmHg. Integumentary (Hair, Skin) Wound #10 status is Open. Original cause of wound was Gradually Appeared. The wound  is located on the Right Calcaneus. The wound measures 1cm length x 1.3cm width x 0.1cm depth; 1.021cm^2 area and 0.102cm^3 volume. The wound is limited to skin breakdown. There is no tunneling or undermining noted. There is a medium amount of serous drainage noted. The wound margin is flat and intact. There is large (67-100%) pink granulation within the wound bed. There is a small (1-33%) amount of necrotic tissue within the wound bed including Eschar and Adherent Slough. The periwound skin appearance exhibited: Maceration, Moist, Erythema. The periwound skin appearance did not exhibit: Callus, Crepitus, Excoriation, Fluctuance, Friable, Induration, Localized Edema, Rash, Scarring, Dry/Scaly, Atrophie Blanche, Cyanosis, Ecchymosis, Hemosiderin Staining, Mottled, Pallor, Rubor. The surrounding wound skin color is noted with erythema which is circumferential. Periwound temperature was noted as No Abnormality. The periwound has tenderness on palpation. Wound #11 status is Open. Original cause of wound was Trauma. The wound is located on the Midline Abdomen - Lower Quadrant. The wound measures 6.4cm length x 3.6cm width x 0.8cm depth; 18.096cm^2 area and 14.476cm^3 volume. The wound is limited to skin breakdown. There is no tunneling or undermining noted. There is a large amount of serous drainage noted. The wound margin is flat and intact. There is large (67-100%) red granulation within the wound bed. There is a small (1-33%) amount of necrotic tissue within the wound bed including Eschar and Adherent Slough. The periwound skin appearance exhibited: Moist, Erythema. The periwound skin appearance did not exhibit: Callus, Crepitus, Excoriation, MIABELLA, SHANNAHAN ([id number]) Fluctuance, Friable, Induration, Localized Edema, Rash, Scarring, Dry/Scaly, Maceration, Atrophie Blanche, Cyanosis, Ecchymosis, Hemosiderin Staining, Mottled, Pallor, Rubor. The surrounding wound skin color is noted with erythema  which is circumferential. Periwound temperature was noted as No Abnormality. The periwound has tenderness on palpation. Wound #12 status is Open. Original cause of wound was Trauma. The wound is located on the Left Abdomen - Lower Quadrant. The wound measures 0.9cm length x 8.5cm width x 1.1cm depth; 6.008cm^2 area and 6.609cm^3 volume. The wound is limited to skin breakdown. There is no tunneling or undermining noted. There is a large amount of serous drainage noted. The wound margin is flat and intact. There is large (67-100%) red granulation within the wound bed. There is a small (1-33%) amount of necrotic tissue within the wound bed including Eschar and Adherent Slough. The periwound skin appearance exhibited: Moist, Erythema. The periwound skin appearance did not exhibit: Callus, Crepitus, Excoriation, Fluctuance, Friable, Induration, Localized Edema, Rash, Scarring, Dry/Scaly, Maceration, Atrophie Blanche, Cyanosis, Ecchymosis, Hemosiderin Staining, Mottled, Pallor, Rubor. The surrounding wound skin color is noted with erythema which is circumferential. Periwound temperature was noted as No Abnormality. The periwound has tenderness on palpation. Wound #13 status is Open. Original cause of wound was Gradually Appeared. The wound is located on the Left Calcaneus. The wound measures 0.7cm length x 0.5cm width x 0.2cm depth; 0.275cm^2 area and 0.055cm^3 volume. The wound is limited to skin breakdown. There is no tunneling or undermining noted. There is a large amount of serosanguineous drainage noted. The wound margin is flat and intact. There is no granulation within the wound bed. There is a large (67-100%) amount of necrotic tissue within the wound bed including Eschar and Adherent Slough. The periwound  skin appearance exhibited: Moist. The periwound skin appearance did not exhibit: Callus, Crepitus, Excoriation, Fluctuance, Friable, Induration, Localized Edema, Rash, Scarring, Dry/Scaly,  Maceration, Atrophie Blanche, Cyanosis, Ecchymosis, Hemosiderin Staining, Mottled, Pallor, Rubor, Erythema. Periwound temperature was noted as No Abnormality. Wound #14 status is Open. Original cause of wound was Surgical Injury. The wound is located on the Right Amputation Site - Digit. The wound measures 0.9cm length x 0.5cm width x 0.1cm depth; 0.353cm^2 area and 0.035cm^3 volume. The wound is limited to skin breakdown. There is no tunneling or undermining noted. There is a medium amount of serous drainage noted. The wound margin is flat and intact. There is no granulation within the wound bed. There is a large (67-100%) amount of necrotic tissue within the wound bed including Eschar and Adherent Slough. The periwound skin appearance exhibited: Maceration, Moist. The periwound skin appearance did not exhibit: Callus, Crepitus, Excoriation, Fluctuance, Friable, Induration, Localized Edema, Rash, Scarring, Dry/Scaly, Atrophie Blanche, Cyanosis, Ecchymosis, Hemosiderin Staining, Mottled, Pallor, Rubor, Erythema. Periwound temperature was noted as No Abnormality. Wound #8 status is Open. Original cause of wound was Gradually Appeared. The wound is located on the Right,Lateral Lower Leg. The wound measures 0.1cm length x 0.1cm width x 0.1cm depth; 0.008cm^2 area and 0.001cm^3 volume. The wound is limited to skin breakdown. There is no tunneling or undermining noted. There is a medium amount of serous drainage noted. The wound margin is flat and intact. There is medium (34-66%) red, pink granulation within the wound bed. There is a medium (34-66%) amount of necrotic tissue within the wound bed including Eschar and Adherent Slough. The periwound skin appearance exhibited: Maceration, Moist, Erythema. The periwound skin appearance did not exhibit: Callus, Crepitus, Excoriation, Fluctuance, Friable, Induration, Localized Edema, Rash, Scarring, Dry/Scaly, Atrophie Blanche, Cyanosis, Ecchymosis,  Hemosiderin Staining, Mottled, Pallor, Rubor. The surrounding wound skin color is noted with erythema which is circumferential. Periwound temperature was noted as No Abnormality. [name], [name] ([id number]) Wound #9 status is Open. Original cause of wound was Gradually Appeared. The wound is located on the Right Achilles. The wound measures 9.5cm length x 4.9cm width x 0.3cm depth; 36.56cm^2 area and 10.968cm^3 volume. The wound is limited to skin breakdown. There is no tunneling or undermining noted. The wound margin is flat and intact. There is large (67-100%) red granulation within the wound bed. There is a small (1-33%) amount of necrotic tissue within the wound bed including Eschar and Adherent Slough. The periwound skin appearance exhibited: Maceration, Moist, Erythema. The periwound skin appearance did not exhibit: Callus, Crepitus, Excoriation, Fluctuance, Friable, Induration, Localized Edema, Rash, Scarring, Dry/Scaly, Atrophie Blanche, Cyanosis, Ecchymosis, Hemosiderin Staining, Mottled, Pallor, Rubor. The surrounding wound skin color is noted with erythema which is circumferential. The periwound has tenderness on palpation. 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 There was a Skin/Subcutaneous Tissue Debridement ([id number]) debridement with total area of 0.35 sq cm performed by Maxwell Caul, MD. with the following instrument(s): Blade and Forceps to remove Viable and Non-Viable tissue/material including Fibrin/Slough, Eschar, and Subcutaneous after achieving pain control using Lidocaine 4% Topical Solution. A  time out was conducted at 10:54, prior to the start of the procedure. A Minimum amount of bleeding was controlled  with Pressure. The procedure was tolerated well with a pain level of 0 throughout and a pain level of 0 following the procedure. Post Debridement Measurements: 0.7cm length x 0.5cm width x 0.2cm depth; 0.055cm^3 volume. Character of Wound/Ulcer Post Debridement requires further debridement. Severity of Tissue Post Debridement is: Limited to breakdown of skin. Post procedure Diagnosis Wound #13: Same as Pre-Procedure Wound #14 Wound #14 is an Open Surgical Wound located on the Right Amputation Site - Digit . There was a Skin/Subcutaneous Tissue Debridement ([id number]) debridement with total area of 0.45 sq cm performed by Maxwell Caul, MD. with the following instrument(s): Curette to remove Viable and Non-Viable tissue/material including Fibrin/Slough, Eschar, and Subcutaneous after achieving pain control KASEN, SAKO. ([id number]) using Lidocaine 4% Topical Solution. A time out was conducted at 10:56, prior to the start of the procedure. A Minimum amount of bleeding was controlled with Pressure. The procedure was tolerated well with a pain level of 0 throughout and a pain level of 0 following the procedure. Post Debridement Measurements: 0.9cm length x 0.5cm width x 0.1cm depth; 0.035cm^3 volume. Character of Wound/Ulcer Post Debridement requires further debridement. Severity of Tissue Post Debridement is: Limited to breakdown of skin. 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 There was a Skin/Subcutaneous Tissue Debridement ([id number]) debridement with total area of 46.55 sq cm performed by Maxwell Caul, MD. with the following instrument(s): Blade and Forceps to remove Viable and Non-Viable tissue/material including Fibrin/Slough, Eschar, and Subcutaneous after  achieving pain control using Lidocaine 4% Topical Solution. 1 Specimen was taken by a Swab and sent to the lab per facility protocol.A time out was conducted at 10:48, prior to the start of the procedure. A Minimum amount of bleeding was controlled with Pressure. The procedure was tolerated well with a pain level of 0 throughout and a pain level of 0 following the procedure. Post Debridement Measurements: 9.5cm length x 4.9cm width x 1cm depth; 36.56cm^3 volume. Character of Wound/Ulcer Post Debridement requires further debridement. Severity of Tissue Post Debridement is: Necrosis of muscle. Post procedure Diagnosis Wound #9: Same as Pre-Procedure Plan Wound Cleansing: Wound #10 Right Calcaneus: Clean wound with Normal Saline. Wound #11 Midline Abdomen - Lower Quadrant: Clean wound with Normal Saline. Wound #12 Left Abdomen - Lower Quadrant: Clean wound with Normal Saline. Wound #13 Left Calcaneus: Clean wound with Normal Saline. Wound #14 Right Amputation Site - Digit: Clean wound with Normal Saline. Wound #8 Right,Lateral Lower Leg: Clean wound with Normal Saline. Wound #9 Right Achilles: Clean wound with Normal Saline. Anesthetic: Wound #10 Right Calcaneus: Topical Lidocaine 4% cream applied to wound bed prior to debridement - in Wound Clinic Wound #11 Midline Abdomen - Lower Quadrant: Topical Lidocaine 4% cream applied to wound bed prior to debridement - in Wound Clinic MAAHI, LANNAN ([id number]) Wound #12 Left Abdomen - Lower Quadrant: Topical Lidocaine 4% cream applied to wound bed prior to debridement - in Wound Clinic Wound #13 Left Calcaneus: Topical Lidocaine 4% cream applied to wound bed prior to debridement - in Wound Clinic Wound #14 Right Amputation Site - Digit: Topical Lidocaine 4% cream applied to wound bed prior to debridement - in Wound Clinic Wound #8 Right,Lateral Lower Leg: Topical Lidocaine 4% cream applied to wound bed prior to debridement - in Wound  Clinic Wound #9 Right Achilles: Topical Lidocaine 4% cream applied to wound bed prior to debridement - in Wound Clinic  Skin Barriers/Peri-Wound Care: Wound #10 Right Calcaneus: Skin Prep Wound #11 Midline Abdomen - Lower Quadrant: Skin Prep Wound #12 Left Abdomen - Lower Quadrant: Skin Prep Wound #13 Left Calcaneus: Skin Prep Wound #14 Right Amputation Site - Digit: Skin Prep Wound #8 Right,Lateral Lower Leg: Skin Prep Wound #9 Right Achilles: Skin Prep Primary Wound Dressing: Wound #10 Right Calcaneus: Aquacel Ag - HHRN to please order his for patient Wound #11 Midline Abdomen - Lower Quadrant: Aquacel Ag - HHRN to please order his for patient Wound #12 Left Abdomen - Lower Quadrant: Aquacel Ag - HHRN to please order his for patient Wound #13 Left Calcaneus: Aquacel Ag - HHRN to please order his for patient Wound #14 Right Amputation Site - Digit: Aquacel Ag - HHRN to please order his for patient Wound #8 Right,Lateral Lower Leg: Aquacel Ag - HHRN to please order his for patient Wound #9 Right Achilles: Aquacel Ag - HHRN to please order his for patient Secondary Dressing: Wound #10 Right Calcaneus: ABD and Kerlix/Conform Dry Gauze - secure with tape Wound #11 Midline Abdomen - Lower Quadrant: ABD and Kerlix/Conform Dry Gauze - secure with tape Wound #12 Left Abdomen - Lower Quadrant: ABD and Kerlix/Conform Dry Gauze - secure with tape Wound #13 Left Calcaneus: Natalie RimaHOPKINS, Diantha Y. ([id number]) ABD and Kerlix/Conform Dry Gauze - secure with tape Wound #14 Right Amputation Site - Digit: ABD and Kerlix/Conform Dry Gauze - secure with tape Wound #8 Right,Lateral Lower Leg: ABD and Kerlix/Conform Dry Gauze - secure with tape Wound #9 Right Achilles: ABD and Kerlix/Conform Dry Gauze - secure with tape Dressing Change Frequency: Wound #10 Right Calcaneus: Change Dressing Monday, Wednesday, Friday - or as needed Wound #11 Midline Abdomen - Lower Quadrant: Change  Dressing Monday, Wednesday, Friday - or as needed Wound #12 Left Abdomen - Lower Quadrant: Change Dressing Monday, Wednesday, Friday - or as needed Wound #13 Left Calcaneus: Change Dressing Monday, Wednesday, Friday - or as needed Wound #14 Right Amputation Site - Digit: Change Dressing Monday, Wednesday, Friday - or as needed Wound #8 Right,Lateral Lower Leg: Change Dressing Monday, Wednesday, Friday - or as needed Wound #9 Right Achilles: Change Dressing Monday, Wednesday, Friday - or as needed Follow-up Appointments: Wound #10 Right Calcaneus: Return Appointment in 1 week. Wound #11 Midline Abdomen - Lower Quadrant: Return Appointment in 1 week. Wound #12 Left Abdomen - Lower Quadrant: Return Appointment in 1 week. Wound #13 Left Calcaneus: Return Appointment in 1 week. Wound #14 Right Amputation Site - Digit: Return Appointment in 1 week. Wound #8 Right,Lateral Lower Leg: Return Appointment in 1 week. Wound #9 Right Achilles: Return Appointment in 1 week. Home Health: Wound #10 Right Calcaneus: [facility] for patient Home Health Nurse may visit PRN to address patient s wound care needs. FACE TO FACE ENCOUNTER: MEDICARE and MEDICAID PATIENTS: I certify that this patient is under my care and that I had a face-to-face encounter that meets the physician face-to-face encounter requirements with this patient on this date. The encounter with the patient was in whole or in part for the following MEDICAL CONDITION: (primary reason for Home Healthcare) MEDICAL NECESSITY: I certify, that based on my findings, NURSING services are a medically necessary home health service. HOME BOUND STATUS: I certify that my clinical findings support that this patient is homebound (i.e., Due to Natalie RimaHOPKINS, Sarajane Y. ([id number]) illness or injury, pt requires aid of supportive devices such as crutches, cane, wheelchairs,  walkers, the  use of special transportation or the assistance of another person to leave their place of residence. There is a normal inability to leave the home and doing so requires considerable and taxing effort. Other absences are for medical reasons / religious services and are infrequent or of short duration when for other reasons). If current dressing causes regression in wound condition, may D/C ordered dressing product/s and apply Normal Saline Moist Dressing daily until next Wound Healing Center / Other MD appointment. Notify Wound Healing Center of regression in wound condition at [phone]. Please direct any NON-WOUND related issues/requests for orders to patient's Primary Care Physician Wound #11 Midline Abdomen - Lower Quadrant: [facility] to order appropriate wound care supplies for patient Home Health Nurse may visit PRN to address patient s wound care needs. FACE TO FACE ENCOUNTER: MEDICARE and MEDICAID PATIENTS: I certify that this patient is under my care and that I had a face-to-face encounter that meets the physician face-to-face encounter requirements with this patient on this date. The encounter with the patient was in whole or in part for the following MEDICAL CONDITION: (primary reason for Home Healthcare) MEDICAL NECESSITY: I certify, that based on my findings, NURSING services are a medically necessary home health service. HOME BOUND STATUS: I certify that my clinical findings support that this patient is homebound (i.e., Due to illness or injury, pt requires aid of supportive devices such as crutches, cane, wheelchairs, walkers, the use of special transportation or the assistance of another person to leave their place of residence. There is a normal inability to leave the home and doing so requires considerable and taxing effort. Other absences are for medical reasons / religious services and are infrequent or of short duration when for other  reasons). If current dressing causes regression in wound condition, may D/C ordered dressing product/s and apply Normal Saline Moist Dressing daily until next Wound Healing Center / Other MD appointment. Notify Wound Healing Center of regression in wound condition at [phone]. Please direct any NON-WOUND related issues/requests for orders to patient's Primary Care Physician Wound #12 Left Abdomen - Lower Quadrant: [facility] - [facility] to order appropriate wound care supplies for patient Home Health Nurse may visit PRN to address patient s wound care needs. FACE TO FACE ENCOUNTER: MEDICARE and MEDICAID PATIENTS: I certify that this patient is under my care and that I had a face-to-face encounter that meets the physician face-to-face encounter requirements with this patient on this date. The encounter with the patient was in whole or in part for the following MEDICAL CONDITION: (primary reason for Home Healthcare) MEDICAL NECESSITY: I certify, that based on my findings, NURSING services are a medically necessary home health service. HOME BOUND STATUS: I certify that my clinical findings support that this patient is homebound (i.e., Due to illness or injury, pt requires aid of supportive devices such as crutches, cane, wheelchairs, walkers, the use of special transportation or the assistance of another person to leave their place of residence. There is a normal inability to leave the home and doing so requires considerable and taxing effort. Other absences are for medical reasons / religious services and are infrequent or of short duration when for other reasons). If current dressing causes regression in wound condition, may D/C ordered dressing product/s and apply Normal Saline Moist Dressing daily until next Wound Healing Center / Other MD appointment. Notify Wound Healing Center of regression in wound condition at  [phone]. Please direct any NON-WOUND related  issues/requests for orders to patient's Primary Care Physician Wound #13 Left Calcaneus: Continue [facility] Visits - [facility] to order appropriate wound care supplies for patient Home Health Nurse may visit PRN to address patient s wound care needs. FACE TO FACE ENCOUNTER: MEDICARE and MEDICAID PATIENTS: I certify that this patient is under my care and that I had a face-to-face encounter that meets the physician face-to-face encounter requirements with this patient on this date. The encounter with the patient was in whole or in part for the following MEDICAL CONDITION: (primary reason for Home Healthcare) MEDICAL NECESSITY: I certify, that based on my findings, NURSING services are a medically necessary home health service. HOME BOUND STATUS: I certify that my clinical findings support that this patient is homebound (i.e., Due to [name], [name] ([id number]) illness or injury, pt requires aid of supportive devices such as crutches, cane, wheelchairs, walkers, the use of special transportation or the assistance of another person to leave their place of residence. There is a normal inability to leave the home and doing so requires considerable and taxing effort. Other absences are for medical reasons / religious services and are infrequent or of short duration when for other reasons). If current dressing causes regression in wound condition, may D/C ordered dressing product/s and apply Normal Saline Moist Dressing daily until next Wound Healing Center / Other MD appointment. Notify Wound Healing Center of regression in wound condition at [phone]. Please direct any NON-WOUND related issues/requests for orders to patient's Primary Care Physician Wound #14 Right Amputation Site - Digit: [facility] Visits - [facility] to order appropriate wound care supplies for patient Home Health Nurse may visit PRN to address patient s wound care needs. FACE TO FACE ENCOUNTER:  MEDICARE and MEDICAID PATIENTS: I certify that this patient is under my care and that I had a face-to-face encounter that meets the physician face-to-face encounter requirements with this patient on this date. The encounter with the patient was in whole or in part for the following MEDICAL CONDITION: (primary reason for Home Healthcare) MEDICAL NECESSITY: I certify, that based on my findings, NURSING services are a medically necessary home health service. HOME BOUND STATUS: I certify that my clinical findings support that this patient is homebound (i.e., Due to illness or injury, pt requires aid of supportive devices such as crutches, cane, wheelchairs, walkers, the use of special transportation or the assistance of another person to leave their place of residence. There is a normal inability to leave the home and doing so requires considerable and taxing effort. Other absences are for medical reasons / religious services and are infrequent or of short duration when for other reasons). If current dressing causes regression in wound condition, may D/C ordered dressing product/s and apply Normal Saline Moist Dressing daily until next Wound Healing Center / Other MD appointment. Notify Wound Healing Center of regression in wound condition at [phone]. Please direct any NON-WOUND related issues/requests for orders to patient's Primary Care Physician Wound #8 Right,Lateral Lower Leg: [facility] to order appropriate wound care supplies for patient Home Health Nurse may visit PRN to address patient s wound care needs. FACE TO FACE ENCOUNTER: MEDICARE and MEDICAID PATIENTS: I certify that this patient is under my care and that I had a face-to-face encounter that meets the physician face-to-face encounter requirements with this patient on this date. The encounter with the patient was in whole  or in part for the following MEDICAL CONDITION: (primary reason for Home  Healthcare) MEDICAL NECESSITY: I certify, that based on my findings, NURSING services are a medically necessary home health service. HOME BOUND STATUS: I certify that my clinical findings support that this patient is homebound (i.e., Due to illness or injury, pt requires aid of supportive devices such as crutches, cane, wheelchairs, walkers, the use of special transportation or the assistance of another person to leave their place of residence. There is a normal inability to leave the home and doing so requires considerable and taxing effort. Other absences are for medical reasons / religious services and are infrequent or of short duration when for other reasons). If current dressing causes regression in wound condition, may D/C ordered dressing product/s and apply Normal Saline Moist Dressing daily until next Wound Healing Center / Other MD appointment. Notify Wound Healing Center of regression in wound condition at [phone]. Please direct any NON-WOUND related issues/requests for orders to patient's Primary Care Physician Wound #9 Right Achilles: [facility] - [facility] to order appropriate wound care supplies for patient Home Health Nurse may visit PRN to address patient s wound care needs. FACE TO FACE ENCOUNTER: MEDICARE and MEDICAID PATIENTS: I certify that this patient is under my care and that I had a face-to-face encounter that meets the physician face-to-face encounter requirements with this patient on this date. The encounter with the patient was in whole or in part for the following MEDICAL CONDITION: (primary reason for Home Healthcare) MEDICAL NECESSITY: I certify, that based on my findings, NURSING services are a medically necessary home health service. HOME BOUND STATUS: I certify that my clinical findings support that this patient is homebound (i.e., Due to [name], [name] ([id number]) illness or injury, pt requires aid of supportive devices such as  crutches, cane, wheelchairs, walkers, the use of special transportation or the assistance of another person to leave their place of residence. There is a normal inability to leave the home and doing so requires considerable and taxing effort. Other absences are for medical reasons / religious services and are infrequent or of short duration when for other reasons). If current dressing causes regression in wound condition, may D/C ordered dressing product/s and apply Normal Saline Moist Dressing daily until next Wound Healing Center / Other MD appointment. Notify Wound Healing Center of regression in wound condition at [phone]. Please direct any NON-WOUND related issues/requests for orders to patient's Primary Care Physician Laboratory ordered were: Wound culture routine - r achiles Radiology ordered were: X-ray, other - r calcaneous #1 silver alginate to all wound areas #2 culture done from the most inferior part of the extensive right heel wound. 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 Other Clinician: Date of Birth/Sex: Female) Treating ROBSON, MICHAEL Primary Care Physician/Extender: Rex Kras, Heidi Physician: Weeks in Treatment: 5 Referring  Physician: [name] Diagnosis Coding ICD-10 Codes Code Description E11.621 Type 2 diabetes mellitus with foot ulcer Unspecified open wound of abdominal wall, left lower quadrant without penetration into S31.104A peritoneal cavity, initial encounter E11.51 Type 2 diabetes mellitus with diabetic peripheral angiopathy without gangrene Facility Procedures CPT4 Code: 16109604 Description: 11042 - DEB SUBQ TISSUE 20 SQ CM/< ICD-10 Description Diagnosis E11.621 Type 2 diabetes mellitus with foot ulcer Modifier: Quantity: 1 CPT4 Code: [id number] Description: 11045 - DEB SUBQ TISS EA ADDL 20CM ICD-10 Description Diagnosis E11.621 Type 2 diabetes mellitus with foot ulcer Modifier: Quantity: 2 Physician Procedures CPT4 Code: [id number] Description: 11042 - WC PHYS SUBQ TISS 20 SQ CM ICD-10 Description Diagnosis E11.621 Type 2 diabetes mellitus with foot ulcer Modifier: Quantity: 1 CPT4 Code: [id number] MARWAH, DISBRO Description: 11045 - WC PHYS SUBQ TISS EA ADDL 20 CM ICD-10 Description Diagnosis E11.621 Type 2 diabetes mellitus with foot ulcer Y. ([id number]) Modifier: Quantity: 2 Electronic Signature(s) Signed: 01/22/2016 7:31:48 AM By: Baltazar Najjar MD Entered By: Baltazar Najjar on 01/21/2016 13:49:06

## 2016-01-22 NOTE — Progress Notes (Signed)
THANA, RAMP ([id number]) Visit Report for 01/21/2016 Arrival Information Details Patient Name: [name], [name]. Date of Service: 01/21/2016 10:15 AM Medical Record Patient Account Number: [id number] Number: Treating RN: Tyreshia, Ingman [birth year] (82 y.o. Other Clinician: Date of Birth/Sex: Female) Treating ROBSON, MICHAEL Primary Care Physician: [name] Physician/Extender: G Referring Physician: Charolotte Capuchin in Treatment: 5 Visit Information History Since Last Visit Added or deleted any medications: No Patient Arrived: Wheel Chair Any new allergies or adverse reactions: No Arrival Time: 10:03 Had a fall or experienced change in No Accompanied By: son activities of daily living that may affect Transfer Assistance: None risk of falls: Patient Identification Verified: Yes Signs or symptoms of abuse/neglect since last No Secondary Verification Process Yes visito Completed: Hospitalized since last visit: No Patient Has Alerts: Yes Pain Present Now: No Patient Alerts: Patient on Blood Thinner DMII Warfarin ABI [place] Bilateral NO BP RIGHT ARM Electronic Signature(s) Signed: 01/21/2016 4:39:07 PM By: Curtis Sites Entered By: Curtis Sites on 01/21/2016 10:05:30 [name] ([id number]) -------------------------------------------------------------------------------- Encounter Discharge Information Details Patient Name: [name]. Date of Service: 01/21/2016 10:15 AM Medical Record Patient Account Number: [id number] Number: Treating RN: Natonya, Finstad 09/06/1933 (82 y.o. Other Clinician: Date of Birth/Sex: Female) Treating ROBSON, MICHAEL Primary Care Physician: [name] Physician/Extender: G Referring Physician: Charolotte Capuchin in Treatment: 5 Encounter Discharge Information Items Discharge Pain Level: 0 Discharge Condition: Stable Ambulatory Status: Wheelchair Discharge Destination: Home Transportation:  Private Auto Accompanied By: son Schedule Follow-up Appointment: Yes Medication Reconciliation completed and provided to Patient/Care No Federick Levene: Provided on Clinical Summary of Care: 01/21/2016 Form Type Recipient Paper Patient [facility] Electronic Signature(s) Signed: 01/21/2016 12:32:18 PM By: Curtis Sites Previous Signature: 01/21/2016 11:26:49 AM Version By: Gwenlyn Perking Entered By: Curtis Sites on 01/21/2016 12:32:18 [name] ([id number]) -------------------------------------------------------------------------------- Lower Extremity Assessment Details Patient Name: [name] Date of Service: 01/21/2016 10:15 AM Medical Record Patient Account Number: [id number] Number: Treating RN: Billiejean, Schimek 25-Jun-1933 (82 y.o. Other Clinician: Date of Birth/Sex: Female) Treating ROBSON, MICHAEL Primary Care Physician: [name] Physician/Extender: G Referring Physician: Charolotte Capuchin in Treatment: 5 Vascular Assessment Pulses: Posterior Tibial Dorsalis Pedis Palpable: [Right:Yes] Extremity colors, hair growth, and conditions: Extremity Color: [Right:Hyperpigmented] Hair Growth on Extremity: [Right:No] Temperature of Extremity: [Right:Warm] Capillary Refill: [Right:< 3 seconds] Electronic Signature(s) Signed: 01/21/2016 4:39:07 PM By: Curtis Sites Entered By: Curtis Sites on 01/21/2016 10:16:24 [name] ([id number]) -------------------------------------------------------------------------------- Multi Wound Chart Details Patient Name: [name] Date of Service: 01/21/2016 10:15 AM Medical Record Patient Account Number: [id number] Number: Treating RN: Yevette, Knust December 12, 1933 (82 y.o. 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] Comorbid History: [10:Arrhythmia, Congestive Arrhythmia, Congestive Arrhythmia, Congestive Heart Failure, Hypertension, Type II Diabetes] [11:Heart Failure, Hypertension, Type II Diabetes] [12:Heart Failure, Hypertension, Type II Diabetes] Date Acquired: [10:06/02/2015] [11:08/12/2015] [12:06/10/2015] Weeks of Treatment: [10:5] [11:5] [12:5] Wound Status: [10:Open] [11:Open] [12:Open] Pending Amputation on Yes [11:No] [12:No] Presentation: Measurements L x W x D 1x1.3x0.1 [11:6.4x3.6x0.8] [12:0.9x8.5x1.1] (cm) Area (cm) : [10:1.021] [11:18.096] [12:6.008]  Volume (cm) : [10:0.102] [11:14.476] [12:6.609] % Reduction in Area: [10:44.50%] [11:54.30%] [12:66.00%] % Reduction in Volume: 72.30% [11:81.70%] [12:83.00%] Classification: [10:Full Thickness Without Exposed Support Structures] [11:Full Thickness Without Exposed Support Structures] [12:Full Thickness Without Exposed Support Structures] HBO Classification: [10:Grade 1] [11:N/A] [12:N/A] Exudate Amount: [10:Medium] [11:Large] [12:Large] Exudate Type: [10:Serous] [11:Serous] [12:Serous] Exudate Color: [10:amber Yes] [11:amber Yes] [12:amber Yes] Foul Odor After Cleansing: Odor Anticipated Due to No No No Product Use: Wound Margin: Flat and Intact Flat and Intact Flat and Intact Granulation Amount: Large (67-100%) Large (67-100%) Large (67-100%) Granulation Quality: Pink Red Red Necrotic Amount: Small (1-33%) Small (1-33%) Small (1-33%) Necrotic  Tissue: Eschar, Adherent Slough Eschar, Adherent Slough Eschar, Adherent Slough Exposed Structures: Fascia: No Fascia: No Fascia: No Fat: No Fat: No Fat: No Tendon: No Tendon: No Tendon: No Muscle: No Muscle: No Muscle: No Joint: No Joint: No Joint: No Bone: No Bone: No Bone: No Limited to Skin Limited to Skin Limited to Skin Breakdown Breakdown Breakdown Epithelialization: None None None Periwound Skin Texture: Edema: No Edema: No Edema: No Excoriation: No Excoriation: No Excoriation: No Induration: No Induration: No Induration: No Callus: No Callus: No Callus: No Crepitus: No Crepitus: No Crepitus: No Fluctuance: No Fluctuance: No Fluctuance: No Friable: No Friable: No Friable: No Rash: No Rash: No Rash: No Scarring: No Scarring: No Scarring: No Periwound Skin Maceration: Yes Moist: Yes Moist: Yes Moisture: Moist: Yes Maceration: No Maceration: No Dry/Scaly: No Dry/Scaly: No Dry/Scaly: No Periwound Skin Color: Erythema: Yes Erythema: Yes Erythema: Yes Atrophie Blanche: No Atrophie Blanche: No Atrophie Blanche: No Cyanosis: No Cyanosis: No Cyanosis: No Ecchymosis: No Ecchymosis: No Ecchymosis: No Hemosiderin Staining: No Hemosiderin Staining: No Hemosiderin Staining: No Mottled: No Mottled: No Mottled: No Pallor: No Pallor: No Pallor: No Rubor: No Rubor: No Rubor: No Erythema Location: Circumferential Circumferential Circumferential Temperature: No Abnormality No Abnormality No Abnormality Tenderness on Yes Yes Yes Palpation: Wound Preparation: Ulcer Cleansing: Ulcer Cleansing: Ulcer Cleansing: Rinsed/Irrigated with Rinsed/Irrigated with Rinsed/Irrigated with Saline Saline Saline Topical Anesthetic Topical Anesthetic Topical Anesthetic Applied: Other: lidocaine Applied: Other: lidocaine Applied: Other: lidocaine 4% 4% 4% KYARAH, ENAMORADO ([id number]) Wound Number: [id number]  Photos: No Photos No Photos No Photos Wound  Location: Left Calcaneus Right Amputation Site - Right Lower Leg - Lateral Digit Wounding Event: Gradually Appeared Surgical Injury Gradually Appeared Primary Etiology: Arterial Insufficiency Ulcer Open Surgical Wound Arterial Insufficiency Ulcer Comorbid History: Arrhythmia, Congestive Arrhythmia, Congestive Arrhythmia, Congestive Heart Failure, Heart Failure, Heart Failure, Hypertension, Type II Hypertension, Type II Hypertension, Type II Diabetes Diabetes Diabetes Date Acquired: 12/31/2015 11/11/2015 06/02/2015 Weeks of Treatment: 3 3 5  Wound Status: Open Open Open Pending Amputation on No No Yes Presentation: Measurements L x W x D 0.7x0.5x0.2 0.9x0.5x0.1 0.1x0.1x0.1 (cm) Area (cm) : 0.275 0.353 0.008 Volume (cm) : 0.055 0.035 0.001 % Reduction in Area: -6.20% 53.20% 99.90% % Reduction in Volume: 29.50% 53.30% 100.00% Classification: Full Thickness Without Full Thickness Without Full Thickness Without Exposed Support Exposed Support Exposed Support Structures Structures Structures HBO Classification: Grade 1 N/A Grade 1 Exudate Amount: Large Medium Medium Exudate Type: Serosanguineous Serous Serous Exudate Color: red, brown amber amber Foul Odor After No No Yes Cleansing: Odor Anticipated Due to N/A N/A No Product Use: Wound Margin: Flat and Intact Flat and Intact Flat and Intact Granulation Amount: None Present (0%) None Present (0%) Medium (34-66%) Granulation Quality: N/A N/A Red, Pink Necrotic Amount: Large (67-100%) Large (67-100%) Medium (34-66%) Necrotic Tissue: Eschar, Adherent Slough Eschar, Adherent [name], Adherent  Slough Exposed Structures: Fascia: No Fascia: No Fascia: No Fat: No Fat: No Fat: No Tendon: No Tendon: No Tendon: No Muscle: No Muscle: No Muscle: No Joint: No Joint: No Joint: No Bone: No Bone: No Bone: No Limited to Skin Limited to Skin Limited to Skin Breakdown Breakdown Breakdown Epithelialization: None None None Periwound  Skin Texture: Edema: No Edema: No Edema: No Excoriation: No Excoriation: No Excoriation: No Induration: No Induration: No Induration: No Natalie RimaHOPKINS, Clotilde Y. ([id number]) Callus: No Callus: No Callus: No Crepitus: No Crepitus: No Crepitus: No Fluctuance: No Fluctuance: No Fluctuance: No Friable: No Friable: No Friable: No Rash: No Rash: No Rash: No Scarring: No Scarring: No Scarring: No Periwound Skin Moist: Yes Maceration: Yes Maceration: Yes Moisture: Maceration: No Moist: Yes Moist: Yes Dry/Scaly: No Dry/Scaly: No Dry/Scaly: No Periwound Skin Color: Atrophie Blanche: No Atrophie Blanche: No Erythema: Yes Cyanosis: No Cyanosis: No Atrophie Blanche: No Ecchymosis: No Ecchymosis: No Cyanosis: No Erythema: No Erythema: No Ecchymosis: No Hemosiderin Staining: No Hemosiderin Staining: No Hemosiderin Staining: No Mottled: No Mottled: No Mottled: No Pallor: No Pallor: No Pallor: No Rubor: No Rubor: No Rubor: No Erythema Location: N/A N/A Circumferential Temperature: No Abnormality No Abnormality No Abnormality Tenderness on No No No Palpation: Wound Preparation: Ulcer Cleansing: Ulcer Cleansing: Ulcer Cleansing: Rinsed/Irrigated with Rinsed/Irrigated with Rinsed/Irrigated with Saline Saline Saline Topical Anesthetic Topical Anesthetic Topical Anesthetic Applied: Other: lidocaine Applied: Other: lidocaine Applied: Other: lidocaine 4% 4% 4% Wound Number: 9 N/A N/A Photos: No Photos N/A N/A Wound Location: Right Achilles N/A N/A Wounding Event: Gradually Appeared N/A N/A Primary Etiology: Arterial Insufficiency Ulcer N/A N/A Comorbid History: Arrhythmia, Congestive N/A N/A Heart Failure, Hypertension, Type II Diabetes Date Acquired: 06/02/2015 N/A N/A Weeks of Treatment: 5 N/A N/A Wound Status: Open N/A N/A Pending Amputation on Yes N/A N/A Presentation: Measurements L x W x D 9.5x4.9x0.3 N/A N/A (cm) Area (cm) : 36.56 N/A N/A Volume  (cm) : 10.968 N/A N/A % Reduction in Area: -12.20% N/A N/A % Reduction in Volume: -12.20% N/A N/A Classification: N/A N/A Natalie RimaHOPKINS, Doretha Y. ([id number]) Full Thickness Without Exposed Support Structures HBO Classification: Grade 1 N/A N/A Exudate Amount: N/A N/A N/A Exudate Type: N/A N/A N/A Exudate Color: N/A N/A N/A Foul Odor After Yes N/A N/A Cleansing: Odor Anticipated Due to No N/A N/A Product Use: Wound Margin: Flat and Intact N/A N/A Granulation Amount: Large (67-100%) N/A N/A Granulation Quality: Red N/A N/A Necrotic Amount: Small (1-33%) N/A N/A Necrotic Tissue: Eschar, Adherent Slough N/A N/A Exposed Structures: Fascia: No N/A N/A Fat: No Tendon: No Muscle: No Joint: No Bone: No Limited to Skin Breakdown Epithelialization: None N/A N/A Periwound Skin Texture: Edema: No N/A N/A Excoriation: No Induration: No Callus: No Crepitus: No Fluctuance: No Friable: No Rash: No Scarring: No Periwound Skin Maceration: Yes N/A N/A Moisture: Moist: Yes Dry/Scaly: No Periwound Skin Color: Erythema: Yes N/A N/A Atrophie Blanche: No Cyanosis: No Ecchymosis: No Hemosiderin Staining: No Mottled: No Pallor: No Rubor: No Erythema Location: Circumferential N/A N/A Temperature: N/A N/A N/A Tenderness on Yes N/A N/A Palpation: Wound Preparation: N/A N/A Natalie RimaHOPKINS, Chanteria Y. ([id number]) Ulcer Cleansing: Rinsed/Irrigated with Saline Topical Anesthetic Applied: Other: lidocaine 4% Treatment Notes Electronic Signature(s) Signed: 01/21/2016 10:38:48 AM By: Curtis Sitesorthy, Joanna Entered By: Curtis Sitesorthy, Joanna on 01/21/2016 10:38:48 Natalie RimaHOPKINS, Annslee Y. ([id number]) -------------------------------------------------------------------------------- Multi-Disciplinary Care Plan Details Patient Name: Natalie RimaHOPKINS, Rhetta Y. Date of Service: 01/21/2016 10:15 AM Medical Record Patient Account Number: [id number] Number: Treating RN: Curtis SitesDorthy, Joanna May 06, 1934 (82 y.o. Other  Clinician: Date of Birth/Sex: Female) Treating ROBSON, MICHAEL Primary Care Physician: Rolm GalaGrandis, Heidi Physician/Extender: G Referring Physician: Charolotte CapuchinGrandis, Heidi Weeks in Treatment: 5 Active Inactive Abuse / Safety / Falls / Self Care Management Nursing Diagnoses: Impaired physical mobility Potential for falls Goals: Patient will remain injury free Date Initiated: 12/17/2015 Goal Status: Active Interventions: Assess fall risk on admission and as needed Notes: Wound/Skin Impairment Nursing Diagnoses: Impaired tissue integrity Goals: Patient/caregiver will verbalize understanding of skin care regimen Date Initiated: 12/17/2015 Goal Status: Active Ulcer/skin breakdown will have a volume reduction of 30% by week 4 Date Initiated: 12/17/2015 Goal Status: Active Ulcer/skin breakdown will have a volume reduction of 50% by week 8 Date Initiated: 12/17/2015 Goal Status: Active Ulcer/skin breakdown will have a volume reduction of 80% by week 12 Date Initiated: 12/17/2015 Goal Status: Active Ulcer/skin breakdown will heal within 14 weeks Natalie RimaHOPKINS, Shanterria Y. ([id number]) Date Initiated: 12/17/2015 Goal Status: Active Interventions: Assess patient/caregiver ability to obtain necessary supplies Assess patient/caregiver ability to perform ulcer/skin care regimen upon admission and as needed Assess ulceration(s) every visit Notes: Electronic Signature(s) Signed: 01/21/2016 10:38:40 AM By: Curtis Sitesorthy, Joanna Entered By: Curtis Sitesorthy, Joanna on 01/21/2016 10:38:38 Natalie RimaHOPKINS, Sadiyah Y. ([id number]) -------------------------------------------------------------------------------- Pain Assessment Details Patient Name: Natalie RimaHOPKINS, Adreonna Y. Date of Service: 01/21/2016 10:15 AM Medical Record Patient Account Number: [id number] Number: Treating RN: Curtis SitesDorthy, Joanna Jun 18, 1933 (82 y.o. Other Clinician: Date of Birth/Sex: Female) Treating ROBSON, MICHAEL Primary Care Physician: Rolm GalaGrandis,  Heidi Physician/Extender: G Referring Physician: Charolotte CapuchinGrandis, Heidi Weeks in Treatment: 5 Active Problems Location of Pain Severity and Description of Pain Patient Has Paino No Site Locations Pain Management and Medication Current Pain Management: Notes Topical or injectable lidocaine is offered to patient for acute pain when surgical debridement is performed. If needed, Patient is instructed to use over the counter pain medication for the following 24-48 hours after debridement. Wound care MDs do not prescribed pain medications. Patient has chronic pain or uncontrolled pain. Patient has been instructed to make an appointment with their Primary Care Physician for pain management. Electronic Signature(s) Signed: 01/21/2016 4:39:07 PM By: Curtis Sitesorthy, Joanna Entered By: Curtis Sitesorthy, Joanna on 01/21/2016 10:05:37 Natalie RimaHOPKINS, Lorilyn Y. ([id number]) -------------------------------------------------------------------------------- Patient/Caregiver Education Details Patient Name: Natalie RimaHOPKINS, Anet Y. Date of Service: 01/21/2016 10:15 AM Medical Record Patient Account Number: [id number] Number: Treating RN: Curtis SitesDorthy, Joanna Jun 18, 1933 (82 y.o. Other Clinician: Date of Birth/Gender: Female) Treating ROBSON, MICHAEL Primary Care Physician: Rolm GalaGrandis, Heidi Physician/Extender: G Referring Physician: Charolotte CapuchinGrandis, Heidi Weeks in Treatment: 5 Education Assessment Education Provided To: Caregiver Education Topics Provided Wound/Skin Impairment: Handouts: Other: wound care as ordered Methods: Demonstration, Explain/Verbal Responses: State content correctly Electronic Signature(s) Signed: 01/21/2016 4:39:07 PM By: Curtis Sitesorthy, Joanna Entered By: Curtis Sitesorthy, Joanna on 01/21/2016 10:39:14 Natalie RimaHOPKINS, Jessabelle Y. ([id number]) -------------------------------------------------------------------------------- Wound Assessment Details Patient Name: Natalie RimaHOPKINS, Treanna Y. Date of Service: 01/21/2016 10:15 AM Medical Record Patient  Account Number: [id number] Number: Treating RN: Curtis SitesDorthy, Joanna Jun 18, 1933 (82 y.o. Other Clinician: Date of Birth/Sex: Female) Treating ROBSON, MICHAEL Primary Care Physician: Rolm GalaGrandis, Heidi Physician/Extender: G Referring Physician: Charolotte CapuchinGrandis, Heidi Weeks in Treatment: 5 Wound Status Wound Number: 10 Primary Arterial Insufficiency Ulcer Etiology: Wound Location: Right Calcaneus Wound Open Wounding Event: Gradually Appeared Status: Date Acquired: 06/02/2015 Comorbid Arrhythmia, Congestive Heart Failure, Weeks Of Treatment: 5 History: Hypertension, Type II Diabetes Clustered Wound: No Pending Amputation On Presentation Photos Wound Measurements Length: (cm) 1 % Reduction in Width: (cm) 1.3 % Reduction in Depth: (cm) 0.1 Epithelializati Area: (cm) 1.021 Tunneling: Volume: (cm) 0.102 Undermining: Area: 44.5% Volume: 72.3% on: None No  No Wound Description Full Thickness Without Foul Odor Afte Classification: Exposed Support Structures Due to Product Diabetic Severity Grade 1 (Wagner): Wound Margin: Flat and Intact Exudate Amount: Medium Exudate Type: Serous Exudate Color: amber r Cleansing: Yes Use: No Wound Bed PEJA, ALLENDER ([id number]) Granulation Amount: Large (67-100%) Exposed Structure Granulation Quality: Pink Fascia Exposed: No Necrotic Amount: Small (1-33%) Fat Layer Exposed: No Necrotic Quality: Eschar, Adherent Slough Tendon Exposed: No Muscle Exposed: No Joint Exposed: No Bone Exposed: No Limited to Skin Breakdown Periwound Skin Texture Texture Color No Abnormalities Noted: No No Abnormalities Noted: No Callus: No Atrophie Blanche: No Crepitus: No Cyanosis: No Excoriation: No Ecchymosis: No Fluctuance: No Erythema: Yes Friable: No Erythema Location: Circumferential Induration: No Hemosiderin Staining: No Localized Edema: No Mottled: No Rash: No Pallor: No Scarring: No Rubor: No Moisture Temperature / Pain No  Abnormalities Noted: No Temperature: No Abnormality Dry / Scaly: No Tenderness on Palpation: Yes Maceration: Yes Moist: Yes Wound Preparation Ulcer Cleansing: Rinsed/Irrigated with Saline Topical Anesthetic Applied: Other: lidocaine 4%, Treatment Notes Wound #10 (Right Calcaneus) 1. Cleansed with: Clean wound with Normal Saline 2. Anesthetic Topical Lidocaine 4% cream to wound bed prior to debridement 4. Dressing Applied: Aquacel Ag 5. Secondary Dressing Applied Guaze, ABD and kerlix/Conform 7. Secured with Secretary/administrator) Signed: 01/21/2016 4:39:07 PM By: Neldon Labella ([id number]) Entered By: Curtis Sites on 01/21/2016 12:26:49 KATRINNA, TRAVIESO ([id number]) -------------------------------------------------------------------------------- Wound Assessment Details Patient Name: REAGEN, GOATES. Date of Service: 01/21/2016 10:15 AM Medical Record Patient Account Number: [id number] Number: Treating RN: Damarys, Speir 11-14-1933 (82 y.o. Other Clinician: Date of Birth/Sex: Female) Treating ROBSON, MICHAEL Primary Care Physician: [name] Physician/Extender: G Referring Physician: Charolotte Capuchin in Treatment: 5 Wound Status Wound Number: 11 Primary Arterial Insufficiency Ulcer Etiology: Wound Location: Abdomen - Lower Quadrant - Midline Wound Open Status: Wounding Event: Trauma Comorbid Arrhythmia, Congestive Heart Failure, Date Acquired: 08/12/2015 History: Hypertension, Type II Diabetes Weeks Of Treatment: 5 Clustered Wound: No Photos Wound Measurements Length: (cm) 6.4 % Reduction in A Width: (cm) 3.6 % Reduction in V Depth: (cm) 0.8 Epithelializatio Area: (cm) 18.096 Tunneling: Volume: (cm) 14.476 Undermining: rea: 54.3% olume: 81.7% n: None No No Wound Description Full Thickness Without Exposed Foul Odor After Classification: Support Structures Due to Product Wound Margin: Flat and  Intact Exudate Large Amount: Exudate Type: Serous Exudate Color: amber Cleansing: Yes Use: No Wound Bed Granulation Amount: Large (67-100%) Exposed Structure RAIMI, GUILLERMO ([id number]) Granulation Quality: Red Fascia Exposed: No Necrotic Amount: Small (1-33%) Fat Layer Exposed: No Necrotic Quality: Eschar, Adherent Slough Tendon Exposed: No Muscle Exposed: No Joint Exposed: No Bone Exposed: No Limited to Skin Breakdown Periwound Skin Texture Texture Color No Abnormalities Noted: No No Abnormalities Noted: No Callus: No Atrophie Blanche: No Crepitus: No Cyanosis: No Excoriation: No Ecchymosis: No Fluctuance: No Erythema: Yes Friable: No Erythema Location: Circumferential Induration: No Hemosiderin Staining: No Localized Edema: No Mottled: No Rash: No Pallor: No Scarring: No Rubor: No Moisture Temperature / Pain No Abnormalities Noted: No Temperature: No Abnormality Dry / Scaly: No Tenderness on Palpation: Yes Maceration: No Moist: Yes Wound Preparation Ulcer Cleansing: Rinsed/Irrigated with Saline Topical Anesthetic Applied: Other: lidocaine 4%, Electronic Signature(s) Signed: 01/21/2016 4:39:07 PM By: Curtis Sites Entered By: Curtis Sites on 01/21/2016 12:27:20 [name] ([id number]) -------------------------------------------------------------------------------- Wound Assessment Details Patient Name: [name] Date of Service: 01/21/2016 10:15 AM Medical Record Patient Account Number: [id number] Number: Treating RN: July, Linam [birth year] (82 y.o. Other  Clinician: Date of Birth/Sex: Female) Treating ROBSON, MICHAEL Primary Care Physician: [name] Physician/Extender: G Referring Physician: Charolotte Capuchin in Treatment: 5 Wound Status Wound Number: 12 Primary Arterial Insufficiency Ulcer Etiology: Wound Location: Left Abdomen - Lower Quadrant Wound Open Status: Wounding Event: Trauma Comorbid  Arrhythmia, Congestive Heart Failure, Date Acquired: 06/10/2015 History: Hypertension, Type II Diabetes Weeks Of Treatment: 5 Clustered Wound: No Photos Wound Measurements Length: (cm) 0.9 % Reduction in Ar Width: (cm) 8.5 % Reduction in Vo Depth: (cm) 1.1 Epithelialization Area: (cm) 6.008 Tunneling: Volume: (cm) 6.609 Undermining: ea: 66% lume: 83% : None No No Wound Description Full Thickness Without Exposed Foul Odor After Classification: Support Structures Due to Product U Wound Margin: Flat and Intact Exudate Large Amount: Exudate Type: Serous Exudate Color: amber Cleansing: Yes se: No Wound Bed Granulation Amount: Large (67-100%) Exposed Structure JANIQUE, HOEFER ([id number]) Granulation Quality: Red Fascia Exposed: No Necrotic Amount: Small (1-33%) Fat Layer Exposed: No Necrotic Quality: Eschar, Adherent Slough Tendon Exposed: No Muscle Exposed: No Joint Exposed: No Bone Exposed: No Limited to Skin Breakdown Periwound Skin Texture Texture Color No Abnormalities Noted: No No Abnormalities Noted: No Callus: No Atrophie Blanche: No Crepitus: No Cyanosis: No Excoriation: No Ecchymosis: No Fluctuance: No Erythema: Yes Friable: No Erythema Location: Circumferential Induration: No Hemosiderin Staining: No Localized Edema: No Mottled: No Rash: No Pallor: No Scarring: No Rubor: No Moisture Temperature / Pain No Abnormalities Noted: No Temperature: No Abnormality Dry / Scaly: No Tenderness on Palpation: Yes Maceration: No Moist: Yes Wound Preparation Ulcer Cleansing: Rinsed/Irrigated with Saline Topical Anesthetic Applied: Other: lidocaine 4%, Electronic Signature(s) Signed: 01/21/2016 4:39:07 PM By: Curtis Sites Entered By: Curtis Sites on 01/21/2016 12:54:30 [name] ([id number]) -------------------------------------------------------------------------------- Wound Assessment Details Patient Name: [name] Date  of Service: 01/21/2016 10:15 AM Medical Record Patient Account Number: [id number] Number: Treating RN: Shardai, Star 26-Aug-1933 (82 y.o. 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 Wound Open Wounding Event: Gradually Appeared Status: Date Acquired: 12/31/2015 Comorbid Arrhythmia, Congestive Heart Failure, Weeks Of Treatment: 3 History: Hypertension, Type II Diabetes Clustered Wound: No Photos Photo Uploaded By: Curtis Sites on 01/22/2016 08:47:45 Wound Measurements Length: (cm) 0.7 Width: (cm) 0.5 Depth: (cm) 0.2 Area: (cm) 0.275 Volume: (cm) 0.055 % Reduction in Area: -6.2% % Reduction in Volume: 29.5% Epithelialization: None Tunneling: No Undermining: No Wound Description Full Thickness Without Exposed Foul Odor Classification: Support Structures Diabetic Severity Grade 1 (Wagner): Wound Margin: Flat and Intact Exudate Amount: Large Exudate Type: Serosanguineous Exudate Color: red, brown After Cleansing: No Wound Bed AMIRRA, HERLING ([id number]) Granulation Amount: None Present (0%) Exposed Structure Necrotic Amount: Large (67-100%) Fascia Exposed: No Necrotic Quality: Eschar, Adherent Slough Fat Layer Exposed: No Tendon Exposed: No Muscle Exposed: No Joint Exposed: No Bone Exposed: No Limited to Skin Breakdown Periwound Skin Texture Texture Color No Abnormalities Noted: No No Abnormalities Noted: No Callus: No Atrophie Blanche: No Crepitus: No Cyanosis: No Excoriation: No Ecchymosis: No Fluctuance: No Erythema: No Friable: No Hemosiderin Staining: No Induration: No Mottled: No Localized Edema: No Pallor: No Rash: No Rubor: No Scarring: No Temperature / Pain Moisture  Temperature: No Abnormality No Abnormalities Noted: No Dry / Scaly: No Maceration: No Moist: Yes Wound Preparation Ulcer Cleansing: Rinsed/Irrigated with Saline Topical Anesthetic Applied: Other: lidocaine 4%, Treatment Notes Wound #13 (Left Calcaneus)  1. Cleansed with: Clean wound with Normal Saline 2. Anesthetic Topical Lidocaine 4% cream to wound bed prior to debridement 4. Dressing Applied: Aquacel Ag 5. Secondary Dressing Applied Guaze, ABD and kerlix/Conform 7. Secured with Secretary/administrator) Signed: 01/21/2016 4:39:07 PM By: Neldon Labella ([id number]) Entered By: Curtis Sites on 01/21/2016 10:37:38 SUNDAI, PROBERT ([id number]) -------------------------------------------------------------------------------- Wound Assessment Details Patient Name: CARLEEN, RHUE. Date of Service: 01/21/2016 10:15 AM Medical Record Patient Account Number: [id number] Number: Treating RN: Shaniquia, Brafford [birth year] (82 y.o. Other Clinician: Date of Birth/Sex: Female) Treating ROBSON, MICHAEL Primary Care Physician: [name] Physician/Extender: G Referring Physician: Charolotte Capuchin in Treatment: 5 Wound Status Wound Number: 14 Primary Open Surgical Wound Etiology: Wound Location: Right Amputation Site - Digit Wound Open Wounding Event: Surgical Injury Status: Date Acquired: 11/11/2015 Comorbid Arrhythmia, Congestive Heart Failure, Weeks Of Treatment: 3 History: Hypertension, Type II Diabetes Clustered Wound: No Photos Photo Uploaded By: Curtis Sites on 01/22/2016 08:47:46 Wound Measurements Length: (cm) 0.9 Width: (cm) 0.5 Depth: (cm) 0.1 Area: (cm) 0.353 Volume: (cm) 0.035 % Reduction in Area: 53.2% % Reduction in Volume: 53.3% Epithelialization: None Tunneling: No Undermining: No Wound Description Full Thickness Without Exposed Classification: Support Structures Wound Margin: Flat and  Intact Exudate Medium Amount: Exudate Type: Serous Exudate Color: amber Foul Odor After Cleansing: No Wound Bed Granulation Amount: None Present (0%) Exposed Structure KAMALJIT, HIZER ([id number]) Necrotic Amount: Large (67-100%) Fascia Exposed: No Necrotic Quality: Eschar, Adherent Slough Fat Layer Exposed: No Tendon Exposed: No Muscle Exposed: No Joint Exposed: No Bone Exposed: No Limited to Skin Breakdown Periwound Skin Texture Texture Color No Abnormalities Noted: No No Abnormalities Noted: No Callus: No Atrophie Blanche: No Crepitus: No Cyanosis: No Excoriation: No Ecchymosis: No Fluctuance: No Erythema: No Friable: No Hemosiderin Staining: No Induration: No Mottled: No Localized Edema: No Pallor: No Rash: No Rubor: No Scarring: No Temperature / Pain Moisture Temperature: No Abnormality No Abnormalities Noted: No Dry / Scaly: No Maceration: Yes Moist: Yes Wound Preparation Ulcer Cleansing: Rinsed/Irrigated with Saline Topical Anesthetic Applied: Other: lidocaine 4%, Treatment Notes Wound #14 (Right Amputation Site - Digit) 1. Cleansed with: Clean wound with Normal Saline 2. Anesthetic Topical Lidocaine 4% cream to wound bed prior to debridement 4. Dressing Applied: Aquacel Ag 5. Secondary Dressing Applied Guaze, ABD and kerlix/Conform 7. Secured with Secretary/administrator) Signed: 01/21/2016 4:39:07 PM By: Curtis Sites Entered By: Curtis Sites on 01/21/2016 10:37:50 MILANY, GECK ([id number]) KISHIA, SHACKETT ([id number]) -------------------------------------------------------------------------------- Wound Assessment Details Patient Name: MELISA, DONOFRIO. Date of Service: 01/21/2016 10:15 AM Medical Record Patient Account Number: [id number] Number: Treating RN: Jerry, Haugen 07-18-1933 (82 y.o. Other Clinician: Date of Birth/Sex: Female) Treating ROBSON, MICHAEL Primary Care Physician: [name] Physician/Extender: G Referring Physician: Charolotte Capuchin in Treatment: 5 Wound Status Wound Number: 8 Primary Arterial Insufficiency Ulcer Etiology: Wound Location: Right Lower Leg - Lateral Wound Open Wounding Event: Gradually Appeared Status: Date Acquired: 06/02/2015 Comorbid Arrhythmia, Congestive Heart Failure, Weeks Of Treatment: 5 History: Hypertension, Type II Diabetes Clustered Wound: No Pending Amputation On Presentation Photos Photo Uploaded By: Curtis Sites on 01/22/2016 08:48:26 Wound Measurements Length: (cm) 0.1 % Reduction in Width: (cm) 0.1 % Reduction in Depth: (cm) 0.1 Epithelializati Area: (cm) 0.008 Tunneling: Volume: (cm) 0.001 Undermining: Area: 99.9% Volume: 100% on: None No No Wound Description Full Thickness Without Foul Odor Afte Classification: Exposed Support Structures Due to Product Diabetic Severity Grade 1 (Wagner): Wound Margin: Flat and Intact Exudate Amount:  Medium Exudate Type: Serous Exudate Color: amber ATONYA, TEMPLER ([id number]) r Cleansing: Yes Use: No Wound Bed Granulation Amount: Medium (34-66%) Exposed Structure Granulation Quality: Red, Pink Fascia Exposed: No Necrotic Amount: Medium (34-66%) Fat Layer Exposed: No Necrotic Quality: Eschar, Adherent Slough Tendon Exposed: No Muscle Exposed: No Joint Exposed: No Bone Exposed: No Limited to Skin Breakdown Periwound Skin Texture Texture Color No Abnormalities Noted: No No Abnormalities Noted: No Callus: No Atrophie Blanche: No Crepitus: No Cyanosis: No Excoriation: No Ecchymosis: No Fluctuance: No Erythema: Yes Friable: No Erythema Location: Circumferential Induration: No Hemosiderin Staining: No Localized Edema: No Mottled: No Rash: No Pallor: No Scarring: No Rubor: No Moisture Temperature / Pain No Abnormalities Noted: No Temperature: No Abnormality Dry / Scaly: No Maceration: Yes Moist: Yes Wound Preparation Ulcer  Cleansing: Rinsed/Irrigated with Saline Topical Anesthetic Applied: Other: lidocaine 4%, Treatment Notes Wound #8 (Right, Lateral Lower Leg) 1. Cleansed with: Clean wound with Normal Saline 2. Anesthetic Topical Lidocaine 4% cream to wound bed prior to debridement 4. Dressing Applied: Aquacel Ag 5. Secondary Dressing Applied Guaze, ABD and kerlix/Conform 7. Secured with [employer] [place]) SAJE, GALLOP ([id number]) Signed: 01/21/2016 4:39:07 PM By: Curtis Sites Entered By: Curtis Sites on 01/21/2016 10:38:17 ARIE, GABLE ([id number]) -------------------------------------------------------------------------------- Wound Assessment Details Patient Name: SHAUNA, BODKINS. Date of Service: 01/21/2016 10:15 AM Medical Record Patient Account Number: [id number] Number: Treating RN: Anice, Wilshire Aug 22, 1933 (82 y.o. Other Clinician: Date of Birth/Sex: Female) Treating ROBSON, MICHAEL Primary Care Physician: [name] Physician/Extender: G Referring Physician: Charolotte Capuchin in Treatment: 5 Wound Status Wound Number: 9 Primary Arterial Insufficiency Ulcer Etiology: Wound Location: Right Achilles Wound Open Wounding Event: Gradually Appeared Status: Date Acquired: 06/02/2015 Comorbid Arrhythmia, Congestive Heart Failure, Weeks Of Treatment: 5 History: Hypertension, Type II Diabetes Clustered Wound: No Pending Amputation On Presentation Photos Photo Uploaded By: Curtis Sites on 01/22/2016 08:48:27 Wound Measurements Length: (cm) 9.5 % Reduction i Width: (cm) 4.9 % Reduction i Depth: (cm) 0.3 Epithelializa Area: (cm) 36.56 Tunneling: Volume: (cm) 10.968 Undermining: n Area: -12.2% n Volume: -12.2% tion: None No No Wound Description Full Thickness Without Foul Odor Af Classification: Exposed Support Structures Due to Produ Diabetic Severity Grade 1 (Wagner): Wound Margin: Flat and Intact ter Cleansing: Yes ct Use:  No Wound Bed Granulation Amount: Large (67-100%) Exposed Structure Granulation Quality: Red Fascia Exposed: No JAMIESON, LISA ([id number]) Necrotic Amount: Small (1-33%) Fat Layer Exposed: No Necrotic Quality: Eschar, Adherent Slough Tendon Exposed: No Muscle Exposed: No Joint Exposed: No Bone Exposed: No Limited to Skin Breakdown Periwound Skin Texture Texture Color No Abnormalities Noted: No No Abnormalities Noted: No Callus: No Atrophie Blanche: No Crepitus: No Cyanosis: No Excoriation: No Ecchymosis: No Fluctuance: No Erythema: Yes Friable: No Erythema Location: Circumferential Induration: No Hemosiderin Staining: No Localized Edema: No Mottled: No Rash: No Pallor: No Scarring: No Rubor: No Moisture Temperature / Pain No Abnormalities Noted: No Tenderness on Palpation: Yes Dry / Scaly: No Maceration: Yes Moist: Yes Wound Preparation Ulcer Cleansing: Rinsed/Irrigated with Saline Topical Anesthetic Applied: Other: lidocaine 4%, Treatment Notes Wound #9 (Right Achilles) 1. Cleansed with: Clean wound with Normal Saline 2. Anesthetic Topical Lidocaine 4% cream to wound bed prior to debridement 4. Dressing Applied: Aquacel Ag 5. Secondary Dressing Applied Guaze, ABD and kerlix/Conform 7. Secured with Secretary/administrator) Signed: 01/21/2016 4:39:07 PM By: Curtis Sites Entered By: Curtis Sites on 01/21/2016 10:38:30 GENNETTE, SHADIX ([id number]) ALLENA, PIETILA ([id number]) -------------------------------------------------------------------------------- Vitals Details Patient Name: AANCHAL, COPE. Date  of Service: 01/21/2016 10:15 AM Medical Record Patient Account Number: [id number] Number: Treating RN: Shay, Jhaveri 03/15/1934 (82 y.o. Other Clinician: Date of Birth/Sex: Female) Treating ROBSON, MICHAEL Primary Care Physician: [name] Physician/Extender: G Referring Physician: Charolotte Capuchin in Treatment:  5 Vital Signs Time Taken: 10:06 Temperature (F): 98.1 Height (in): 65 Pulse (bpm): 72 Weight (lbs): 160 Respiratory Rate (breaths/min): 18 Body Mass Index (BMI): 26.6 Blood Pressure (mmHg): 112/47 Reference Range: 80 - 120 mg / dl Electronic Signature(s) Signed: 01/21/2016 4:39:07 PM By: Curtis Sites Entered By: Curtis Sites on 01/21/2016 10:08:09

## 2016-01-27 LAB — AEROBIC CULTURE W GRAM STAIN (SUPERFICIAL SPECIMEN)

## 2016-01-27 LAB — AEROBIC CULTURE  (SUPERFICIAL SPECIMEN): GRAM STAIN: NONE SEEN

## 2016-01-28 ENCOUNTER — Other Ambulatory Visit: Payer: Self-pay | Admitting: Internal Medicine

## 2016-01-28 ENCOUNTER — Encounter: Payer: Medicare Other | Admitting: Internal Medicine

## 2016-01-28 DIAGNOSIS — E11621 Type 2 diabetes mellitus with foot ulcer: Secondary | ICD-10-CM | POA: Diagnosis not present

## 2016-01-28 DIAGNOSIS — M86271 Subacute osteomyelitis, right ankle and foot: Secondary | ICD-10-CM

## 2016-01-28 NOTE — Progress Notes (Signed)
BETSIE, PECKMAN ([id number]) Visit Report for 11/01/2015 Arrival Information Details Patient Name: [name], [name]. Date of Service: 11/01/2015 1:30 PM Medical Record Number: [id number] Patient Account Number: [id number] Date of Birth/Sex: May 03, 1934 (81 y.o. Female) Treating RN: Afful, RN, BSN, [place] Sink Primary Care Physician: [name] Other Clinician: Referring Physician: [name] Treating Physician/Extender: Rudene Re in Treatment: 4 Visit Information History Since Last Visit Added or deleted any medications: No Patient Arrived: Wheel Chair Any new allergies or adverse reactions: No Arrival Time: 13:59 Had a fall or experienced change in No Accompanied By: dtr activities of daily living that may affect Transfer Assistance: EasyPivot Patient risk of falls: Lift Signs or symptoms of abuse/neglect since last No Patient Identification Verified: Yes visito Secondary Verification Process Yes Hospitalized since last visit: No Completed: Has Dressing in Place as Prescribed: Yes Patient Has Alerts: Yes Pain Present Now: No Patient Alerts: Patient on Blood Thinner DMII Warfarin Electronic Signature(s) Signed: 11/01/2015 5:15:14 PM By: Elpidio Eric BSN, RN Entered By: Elpidio Eric on 11/01/2015 14:02:48 [name] ([id number]) -------------------------------------------------------------------------------- Clinic Level of Care Assessment Details Patient Name: [name]. Date of Service: 11/01/2015 1:30 PM Medical Record Number: [id number] Patient Account Number: [id number] Date of Birth/Sex: 02-04-1934 (81 y.o. Female) Treating RN: Afful, RN, BSN, [place] Sink Primary Care Physician: [name] Other Clinician: Referring Physician: [name] Treating Physician/Extender: Rudene Re in Treatment: 4 Clinic Level of Care Assessment Items TOOL 4 Quantity Score []  - Use when only an EandM is performed on FOLLOW-UP visit 0 ASSESSMENTS - Nursing  Assessment / Reassessment X - Reassessment of Co-morbidities (includes updates in patient status) 1 10 X - Reassessment of Adherence to Treatment Plan 1 5 ASSESSMENTS - Wound and Skin Assessment / Reassessment []  - Simple Wound Assessment / Reassessment - one wound 0 X - Complex Wound Assessment / Reassessment - multiple wounds 7 5 []  - Dermatologic / Skin Assessment (not related to wound area) 0 ASSESSMENTS - Focused Assessment []  - Circumferential Edema Measurements - multi extremities 0 []  - Nutritional Assessment / Counseling / Intervention 0 X - Lower Extremity Assessment (monofilament, tuning fork, pulses) 1 5 []  - Peripheral Arterial Disease Assessment (using hand held doppler) 0 ASSESSMENTS - Ostomy and/or Continence Assessment and Care []  - Incontinence Assessment and Management 0 []  - Ostomy Care Assessment and Management (repouching, etc.) 0 PROCESS - Coordination of Care []  - Simple Patient / Family Education for ongoing care 0 X - Complex (extensive) Patient / Family Education for ongoing care 1 20 []  - Staff obtains Chiropractor, Records, Test Results / Process Orders 0 []  - Staff telephones HHA, Nursing Homes / Clarify orders / etc 0 []  - Routine Transfer to another Facility (non-emergent condition) 0 [name], [name] ([id number]) []  - Routine Hospital Admission (non-emergent condition) 0 []  - New Admissions / Manufacturing engineer / Ordering NPWT, Apligraf, etc. 0 []  - Emergency Hospital Admission (emergent condition) 0 []  - Simple Discharge Coordination 0 []  - Complex (extensive) Discharge Coordination 0 PROCESS - Special Needs []  - Pediatric / Minor Patient Management 0 []  - Isolation Patient Management 0 []  - Hearing / Language / Visual special needs 0 []  - Assessment of Community assistance (transportation, D/C planning, etc.) 0 []  - Additional assistance / Altered mentation 0 []  - Support Surface(s) Assessment (bed, cushion, seat, etc.) 0 INTERVENTIONS - Wound  Cleansing / Measurement []  - Simple Wound Cleansing - one wound 0 X - Complex Wound Cleansing - multiple wounds 7 5 X - Wound  Imaging (photographs - any number of wounds) 1 5 []  - Wound Tracing (instead of photographs) 0 []  - Simple Wound Measurement - one wound 0 X - Complex Wound Measurement - multiple wounds 7 5 INTERVENTIONS - Wound Dressings []  - Small Wound Dressing one or multiple wounds 0 X - Medium Wound Dressing one or multiple wounds 7 15 []  - Large Wound Dressing one or multiple wounds 0 []  - Application of Medications - topical 0 []  - Application of Medications - injection 0 INTERVENTIONS - Miscellaneous []  - External ear exam 0 [name], [name] ([id number]) []  - Specimen Collection (cultures, biopsies, blood, body fluids, etc.) 0 []  - Specimen(s) / Culture(s) sent or taken to Lab for analysis 0 []  - Patient Transfer (multiple staff / Michiel Sites Lift / Similar devices) 0 []  - Simple Staple / Suture removal (25 or less) 0 []  - Complex Staple / Suture removal (26 or more) 0 []  - Hypo / Hyperglycemic Management (close monitor of Blood Glucose) 0 []  - Ankle / Brachial Index (ABI) - do not check if billed separately 0 X - Vital Signs 1 5 Has the patient been seen at the hospital within the last three years: Yes Total Score: 260 Level Of Care: New/Established - Level 5 Electronic Signature(s) Signed: 11/01/2015 5:15:14 PM By: Elpidio Eric BSN, RN Entered By: Elpidio Eric on 11/01/2015 14:42:31 [name] ([id number]) -------------------------------------------------------------------------------- Encounter Discharge Information Details Patient Name: [name] Date of Service: 11/01/2015 1:30 PM Medical Record Number: [id number] Patient Account Number: [id number] Date of Birth/Sex: 01-06-1934 (81 y.o. Female) Treating RN: Clover Mealy, RN, BSN, [place] Sink Primary Care Physician: [name] Other Clinician: Referring Physician: [name] Treating Physician/Extender: Rudene Re in Treatment: 4 Encounter Discharge Information Items Discharge Pain Level: 0 Discharge Condition: Stable Ambulatory Status: Wheelchair Discharge Destination: Home Transportation: Private Auto Accompanied By: dtr Schedule Follow-up Appointment: No Medication Reconciliation completed No and provided to Patient/Care Kiarrah Rausch: Provided on Clinical Summary of Care: 11/01/2015 Form Type Recipient Paper Patient [facility] Electronic Signature(s) Signed: 11/01/2015 4:47:12 PM By: Elpidio Eric BSN, RN Previous Signature: 11/01/2015 2:55:11 PM Version By: Gwenlyn Perking Entered By: Elpidio Eric on 11/01/2015 16:47:12 [name], [name] ([id number]) -------------------------------------------------------------------------------- Lower Extremity Assessment Details Patient Name: [name] Date of Service: 11/01/2015 1:30 PM Medical Record Number: [id number] Patient Account Number: [id number] Date of Birth/Sex: 1933-11-01 (81 y.o. Female) Treating RN: Afful, RN, BSN, [place] Sink Primary Care Physician: [name] Other Clinician: Referring Physician: [name] Treating Physician/Extender: Rudene Re in Treatment: 4 Edema Assessment Assessed: [Left: No] [Right: No] E[Left: dema] [Right: :] Calf Left: Right: Point of Measurement: 33 cm From Medial Instep cm 32 cm Ankle Left: Right: Point of Measurement: 10 cm From Medial Instep cm 22 cm Vascular Assessment Claudication: Claudication Assessment [Right:None] Pulses: Posterior Tibial Palpable: [Left:Yes] [Right:Yes] Dorsalis Pedis Palpable: [Left:Yes] [Right:Yes] Extremity colors, hair growth, and conditions: Extremity Color: [Left:Mottled] [Right:Mottled] Hair Growth on Extremity: [Left:No] [Right:No] Temperature of Extremity: [Left:Warm] [Right:Warm] Capillary Refill: [Left:< 3 seconds] [Right:< 3 seconds] Toe Nail Assessment Left: Right: Thick: Yes Yes Discolored: Yes Yes Deformed: No No Improper Length and  Hygiene: No No Electronic Signature(s) Signed: 11/01/2015 5:15:14 PM By: Elpidio Eric BSN, RN [place], [name] ([id number]) Entered By: Elpidio Eric on 11/01/2015 14:04:09 [name] ([id number]) -------------------------------------------------------------------------------- Multi Wound Chart Details Patient Name: [name] Date of Service: 11/01/2015 1:30 PM Medical Record Number: [id number] Patient Account Number: [id number] Date of Birth/Sex: [birth year] (81 y.o. 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: [1:Diabetic Wound/Ulcer of Diabetic Wound/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] [2:the Lower Extremity] [3:the Lower Extremity] Comorbid History: [1:Arrhythmia, Congestive Arrhythmia, Congestive Arrhythmia, Congestive Heart Failure, Type II Diabetes] [2:Heart Failure, Type II Diabetes] [3:Heart Failure, Type II Diabetes] Date Acquired: [1:09/02/2015] [2:09/02/2015] [3:09/02/2015] Weeks of Treatment: [1:4] [2:4] [3:4] Wound Status: [1:Open] [2:Open] [3:Open] Clustered Wound: [1:No] [2:No] [3:No] Pending Amputation on Yes [2:Yes] [3:Yes] Presentation: Measurements L x W x D 0.6x0.6x0.1 [2:6x4x0.1] [3:1.5x1.5x0.1] (cm) Area (cm) : [1:0.283] [2:18.85] [3:1.767] Volume (cm) : [1:0.028] [2:1.885] [3:0.177] %  Reduction in Area: [1:67.20%] [2:-287.10%] [3:6.30%] % Reduction in Volume: 67.40% [2:-287.10%] [3:5.90%] Classification: [1:Unclassifiable] [2:Partial Thickness] [3:Unclassifiable] HBO Classification: [1:Grade 1] [2:Grade 1] [3:Grade 1] Exudate Amount: [1:None Present] [2:Medium] [3:None Present] Exudate Type: [1:N/A] [2:Serous] [3:N/A] Exudate Color: [1:N/A] [2:amber] [3:N/A] Foul Odor After [1:No] [2:Yes] [3:No] Cleansing: Odor Anticipated Due to N/A [2:No] [3:N/A] Product Use: LATORSHA, CURLING ([id number]) Wound Margin: Flat and Intact Flat and Intact Flat and Intact Granulation Amount: None Present (0%) Small (1-33%) None Present (0%) Granulation Quality: N/A Pink N/A Necrotic Amount: Large (67-100%) Large (67-100%) Large (67-100%) Necrotic Tissue: Eschar Eschar, Adherent Slough Eschar Exposed Structures: Fascia: No Fascia: No Fascia: No Fat: No Fat: No Fat: No Tendon: No Tendon: No Tendon: No Muscle: No Muscle: No Muscle: No Joint: No Joint: No Joint: No Bone: No Bone: No Bone: No Limited to Skin Limited to Skin Limited to Skin Breakdown Breakdown Breakdown Epithelialization: None None None Periwound Skin Texture: Edema: Yes Edema: Yes Edema: Yes Excoriation: No Excoriation: No Excoriation: No Induration: No Induration: No Induration: No Callus: No Callus: No Callus: No Crepitus: No Crepitus: No Crepitus: No Fluctuance: No Fluctuance: No Fluctuance: No Friable: No Friable: No Friable: No Rash: No Rash: No Rash: No Scarring: No Scarring: No Scarring: No Periwound Skin Dry/Scaly: Yes Maceration: Yes Dry/Scaly: Yes Moisture: Maceration: No Moist: Yes Maceration: No Moist: No Dry/Scaly: No Moist: No Periwound Skin Color: Erythema: Yes Erythema: Yes Erythema: Yes Atrophie Blanche: No Atrophie Blanche: No Atrophie Blanche: No Cyanosis: No Cyanosis: No Cyanosis: No Ecchymosis: No Ecchymosis: No Ecchymosis: No Hemosiderin Staining:  No Hemosiderin Staining: No Hemosiderin Staining: No Mottled: No Mottled: No Mottled: No Pallor: No Pallor: No Pallor: No Rubor: No Rubor: No Rubor: No Erythema Location: Circumferential Circumferential Circumferential Temperature: N/A N/A N/A Tenderness on Yes Yes Yes Palpation: Wound Preparation: Ulcer Cleansing: Ulcer Cleansing: Ulcer Cleansing: Rinsed/Irrigated with Rinsed/Irrigated with Rinsed/Irrigated with Saline Saline Saline Topical Anesthetic Topical Anesthetic Topical Anesthetic Applied: Other: Lidocaine Applied: Other: Lidocaine Applied: Other: lidocaine 4% 4% 4% Wound Number: 4 5 6  Photos: No Photos No Photos No Photos Wound Location: Right Achilles Right Lower Leg - Lateral Left Abdomen - Lower [name], [name] ([id number]) Wounding Event: Not Known Not Known Trauma 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 Trauma, Other Secondary Etiology: Diabetic Wound/Ulcer of Diabetic Wound/Ulcer of N/A the Lower Extremity the Lower Extremity Comorbid History: Arrhythmia, Congestive Arrhythmia, Congestive Arrhythmia, Congestive Heart Failure, Type II Heart Failure, Type II Heart Failure, Type II Diabetes Diabetes  Diabetes Date Acquired: 09/02/2015 09/02/2015 06/10/2015 Weeks of Treatment: 4 4 4  Wound Status: Open Open Open Clustered Wound: No Yes No Pending Amputation on Yes Yes No Presentation: Measurements L x W x D 5.5x6x0.3 6x3.5x0.1 4x10.2x0.1 (cm) Area (cm) : 25.918 16.493 32.044 Volume (cm) : 7.775 1.649 3.204 % Reduction in Area: -79.40% 81.10% -28.30% % Reduction in Volume: -79.40% 90.50% -28.30% Classification: Full Thickness Without Full Thickness Without Unclassifiable Exposed Support Exposed Support Structures Structures HBO Classification: Grade 1 Grade 1 N/A Exudate Amount: Large Medium Small Exudate Type: Serous Serous N/A Exudate Color: amber amber N/A Foul Odor After Yes Yes Yes Cleansing: Odor  Anticipated Due to No No No Product Use: Wound Margin: Flat and Intact Flat and Intact Flat and Intact Granulation Amount: Small (1-33%) None Present (0%) None Present (0%) Granulation Quality: Pink N/A N/A Necrotic Amount: Large (67-100%) Large (67-100%) Large (67-100%) Necrotic Tissue: Eschar, Adherent Slough Eschar, Adherent Slough Eschar Exposed Structures: Fascia: No Fascia: No Fascia: No Fat: No Fat: No Fat: No Tendon: No Tendon: No Tendon: No Muscle: No Muscle: No Muscle: No Joint: No Joint: No Joint: No Bone: No Bone: No Bone: No Limited to Skin Limited to Skin Limited to Skin Breakdown Breakdown Breakdown Epithelialization: None Small (1-33%) None Periwound Skin Texture: Edema: Yes Edema: Yes Edema: Yes Excoriation: No Excoriation: No Excoriation: No Induration: No Induration: No Induration: No Callus: No Callus: No Callus: No Crepitus: No Crepitus: No Crepitus: No [name], [name] ([id number]) Fluctuance: No Fluctuance: No Fluctuance: No Friable: No Friable: No Friable: No Rash: No Rash: No Rash: No Scarring: No Scarring: No Scarring: No Periwound Skin Moist: Yes Moist: Yes Dry/Scaly: Yes Moisture: Maceration: No Dry/Scaly: Yes Maceration: No Dry/Scaly: No Maceration: No Moist: No Periwound Skin Color: Erythema: Yes Erythema: Yes Erythema: Yes Atrophie Blanche: No Atrophie Blanche: No Atrophie Blanche: No Cyanosis: No Cyanosis: No Cyanosis: No Ecchymosis: No Ecchymosis: No Ecchymosis: No Hemosiderin Staining: No Hemosiderin Staining: No Hemosiderin Staining: No Mottled: No Mottled: No Mottled: No Pallor: No Pallor: No Pallor: No Rubor: No Rubor: No Rubor: No Erythema Location: Circumferential Circumferential Circumferential Temperature: Cool/Cold N/A N/A Tenderness on Yes Yes Yes Palpation: Wound Preparation: Ulcer Cleansing: Ulcer Cleansing: Ulcer Cleansing: Rinsed/Irrigated with Rinsed/Irrigated with  Rinsed/Irrigated with Saline Saline Saline Topical Anesthetic Topical Anesthetic Topical Anesthetic Applied: Other: lidocaine Applied: Other: lidocaine Applied: Other: lidocaine 4% 4% 4% Wound Number: 7 N/A N/A Photos: No Photos N/A N/A Wound Location: Abdomen - midline - Distal N/A N/A Wounding Event: Trauma N/A N/A Primary Etiology: Trauma, Other N/A N/A Secondary Etiology: N/A N/A N/A Comorbid History: Arrhythmia, Congestive N/A N/A Heart Failure, Type II Diabetes Date Acquired: 08/12/2015 N/A N/A Weeks of Treatment: 4 N/A N/A Wound Status: Open N/A N/A Clustered Wound: No N/A N/A Pending Amputation on No N/A N/A Presentation: Measurements L x W x D 6x3x0.3 N/A N/A (cm) Area (cm) : 14.137 N/A N/A Volume (cm) : 4.241 N/A N/A % Reduction in Area: -435.70% N/A N/A % Reduction in Volume: -703.20% N/A N/A Classification: Unclassifiable N/A N/A HBO Classification: N/A N/A N/A YOKO, MCGAHEE ([id number]) Exudate Amount: Medium N/A N/A Exudate Type: Serous N/A N/A Exudate Color: amber N/A N/A Foul Odor After No N/A N/A Cleansing: Odor Anticipated Due to N/A N/A N/A Product Use: Wound Margin: Flat and Intact N/A N/A Granulation Amount: None Present (0%) N/A N/A Granulation Quality: N/A N/A N/A Necrotic Amount: Large (67-100%) N/A N/A Necrotic Tissue: Eschar, Adherent Slough N/A N/A Exposed Structures: Fascia: No N/A N/A Fat: No  Tendon: No Muscle: No Joint: No Bone: No Limited to Skin Breakdown Epithelialization: None N/A N/A Periwound Skin Texture: Edema: No N/A N/A Excoriation: No Induration: No Callus: No Crepitus: No Fluctuance: No Friable: No Rash: No Scarring: No Periwound Skin Moist: Yes N/A N/A Moisture: Maceration: No Dry/Scaly: No Periwound Skin Color: Erythema: Yes N/A N/A Atrophie Blanche: No Cyanosis: No Ecchymosis: No Hemosiderin Staining: No Mottled: No Pallor: No Rubor: No Erythema Location: Circumferential N/A N/A Temperature:  N/A N/A N/A Tenderness on Yes N/A N/A Palpation: Wound Preparation: Ulcer Cleansing: N/A N/A Rinsed/Irrigated with Saline Topical Anesthetic [name], [name] ([id number]) Applied: Other: lidocaine 4% Treatment Notes Electronic Signature(s) Signed: 11/01/2015 5:15:14 PM By: Elpidio Eric BSN, RN Entered By: Elpidio Eric on 11/01/2015 14:37:39 [name] ([id number]) -------------------------------------------------------------------------------- Multi-Disciplinary Care Plan Details Patient Name: [name], RINGLE. Date of Service: 11/01/2015 1:30 PM Medical Record Number: [id number] Patient Account Number: [id number] Date of Birth/Sex: 01-31-1934 (81 y.o. Female) Treating RN: Afful, RN, BSN, [place] Sink Primary Care Physician: [name] Other Clinician: Referring Physician: [name] Treating Physician/Extender: Rudene Re in Treatment: 4 Active Inactive Electronic Signature(s) Signed: 11/22/2015 8:52:57 AM By: Elliot Gurney RN, BSN, Kim RN, BSN Signed: 01/28/2016 3:27:27 PM By: Elpidio Eric BSN, RN Previous Signature: 11/01/2015 5:15:14 PM Version By: Elpidio Eric BSN, RN Entered By: Elliot Gurney, RN, BSN, Kim on 11/22/2015 08:52:57 [name], [name] ([id number]) -------------------------------------------------------------------------------- Pain Assessment Details Patient Name: [name], [name]. Date of Service: 11/01/2015 1:30 PM Medical Record Number: [id number] Patient Account Number: [id number] Date of Birth/Sex: 1934/03/18 (81 y.o. Female) Treating RN: Clover Mealy, RN, BSN, [place] Sink Primary Care Physician: [name] Other Clinician: Referring Physician: [name] Treating Physician/Extender: Rudene Re in Treatment: 4 Active Problems Location of Pain Severity and Description of Pain Patient Has Paino No Site Locations With Dressing Change: No Pain Management and Medication Current Pain Management: Electronic Signature(s) Signed: 11/01/2015 5:15:14 PM By: Elpidio Eric BSN, RN Entered By: Elpidio Eric on 11/01/2015 14:02:58 [name] ([id number]) -------------------------------------------------------------------------------- Patient/Caregiver Education Details Patient Name: [name] Date of Service: 11/01/2015 1:30 PM Medical Record Number: [id number] Patient Account Number: [id number] Date of Birth/Gender: 1933/09/27 ([name]) Treating RN: Clover Mealy, RN, BSN,  Sink Primary Care Physician: [name] Other Clinician: Referring Physician: [name] Treating Physician/Extender: Rudene Re in Treatment: 4 Education Assessment Education Provided To: Patient Education Topics Provided [facility]: Methods: Explain/Verbal Responses: State content correctly Wound/Skin Impairment: Methods: Explain/Verbal Responses: State content correctly Electronic Signature(s) Signed: 11/01/2015 4:47:28 PM By: Elpidio Eric BSN, RN Entered By: Elpidio Eric on 11/01/2015 16:47:28 [name] ([id number]) -------------------------------------------------------------------------------- Wound Assessment Details Patient Name: [name]. Date of Service: 11/01/2015 1:30 PM Medical Record Number: [id number] Patient Account Number: [id number] Date of Birth/Sex: 03/20/1934 (81 y.o. 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 Event: Gradually Appeared Etiology: Extremity Date Acquired: 09/02/2015 Wound Status: Open Weeks Of Treatment: 4 Comorbid Arrhythmia, Congestive Heart Clustered Wound: No History: Failure, Type II Diabetes Pending Amputation On Presentation Photos Photo Uploaded By:  Elpidio Eric on 11/01/2015 17:10:43 Wound Measurements Length: (cm) 0.6 Width: (cm) 0.6 Depth: (cm) 0.1 Area: (cm) 0.283 Volume: (cm) 0.028 % Reduction in Area: 67.2% % Reduction in Volume: 67.4% Epithelialization: None Tunneling: No Undermining: No Wound Description  Classification: Unclassifiable Foul Odor A Diabetic Severity (Wagner): Grade 1 Wound Margin: Flat and Intact Exudate Amount: None Present fter Cleansing: No Wound Bed Granulation Amount: None Present (0%) Exposed Structure Necrotic Amount: Large (67-100%) Fascia Exposed: No Necrotic Quality: Eschar Fat Layer Exposed: No Tendon Exposed: No KERSTYN, CORYELL ([id number]) Muscle Exposed: No Joint Exposed: No Bone Exposed: No Limited to Skin Breakdown Periwound Skin Texture Texture Color No Abnormalities Noted: No No Abnormalities Noted: No Callus: No Atrophie Blanche: No Crepitus: No Cyanosis: No Excoriation: No Ecchymosis: No Fluctuance: No Erythema: Yes Friable: No Erythema Location: Circumferential Induration: No Hemosiderin Staining: No Localized Edema: Yes Mottled: No Rash: No Pallor: No Scarring: No Rubor: No Moisture Temperature / Pain No Abnormalities Noted: No Tenderness on Palpation: Yes Dry / Scaly: Yes Maceration: No Moist: No Wound Preparation Ulcer Cleansing: Rinsed/Irrigated with Saline Topical Anesthetic Applied: Other: Lidocaine 4%, Electronic Signature(s) Signed: 11/01/2015 2:24:55 PM By: Elpidio Eric BSN, RN Entered By: Elpidio Eric on 11/01/2015 14:24:55 [name] ([id number]) -------------------------------------------------------------------------------- Wound Assessment Details Patient Name: [name] Date of Service: 11/01/2015 1:30 PM Medical Record Number: [id number] Patient Account Number: [id number] Date of Birth/Sex: 01/04/1934 (81 y.o. 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 Event: Not Known Etiology: Extremity Date Acquired: 09/02/2015 Wound Status: Open Weeks Of Treatment: 4 Comorbid Arrhythmia, Congestive Heart Clustered Wound: No History: Failure, Type II Diabetes Pending Amputation On Presentation Photos Photo Uploaded By: Elpidio Eric on 11/01/2015 17:10:44 Wound Measurements Length: (cm) 6 Width: (cm) 4 Depth: (cm) 0.1 Area: (cm) 18.85 Volume: (cm) 1.885 % Reduction in Area: -287.1% % Reduction in Volume: -287.1% Epithelialization: None Tunneling: No Undermining: No Wound Description Classification: Partial Thickness Diabetic Severity (Wagner): Grade 1 Wound Margin: Flat and Intact Exudate Amount: Medium Exudate Type: Serous Exudate Color: amber Foul Odor After Cleansing: Yes Due to Product Use: No Wound Bed Granulation Amount: Small (1-33%) Exposed Structure Granulation Quality: Pink Fascia Exposed: No DANIELLE, MINK ([id number]) Necrotic Amount: Large (67-100%) Fat Layer Exposed: No Necrotic Quality: Eschar, Adherent Slough Tendon Exposed: No Muscle Exposed: No Joint Exposed: No Bone Exposed: No Limited to Skin Breakdown Periwound Skin Texture Texture Color No Abnormalities Noted: No No Abnormalities Noted: No Callus: No Atrophie Blanche: No Crepitus: No Cyanosis: No Excoriation: No Ecchymosis: No Fluctuance: No Erythema: Yes Friable: No Erythema Location: Circumferential Induration: No Hemosiderin Staining: No Localized Edema: Yes Mottled: No Rash: No Pallor: No Scarring: No Rubor: No Moisture Temperature / Pain No Abnormalities Noted: No Tenderness on Palpation: Yes Dry / Scaly: No Maceration: Yes Moist: Yes Wound Preparation Ulcer Cleansing:  Rinsed/Irrigated with Saline Topical Anesthetic Applied: Other: Lidocaine 4%, Electronic Signature(s) Signed: 11/01/2015 5:15:14 PM By: Elpidio Eric BSN, RN Previous Signature: 11/01/2015 2:25:34 PM Version By: Elpidio Eric BSN, RN Entered By: Elpidio Eric on 11/01/2015 14:34:31 [name] ([id number]) -------------------------------------------------------------------------------- Wound Assessment Details Patient Name: [name] Date of Service: 11/01/2015 1:30 PM Medical Record Number: [id number] Patient Account Number: [id number] Date of Birth/Sex: 11-22-1933 (81 y.o. 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 Event: Not Known Etiology: Extremity Date Acquired: 09/02/2015 Wound Status: Open Weeks Of Treatment: 4 Comorbid Arrhythmia, Congestive Heart Clustered Wound: No History: Failure, Type II Diabetes Pending Amputation On Presentation Photos Photo Uploaded By: Elpidio Eric on 11/01/2015 17:11:17 Wound Measurements Length: (cm) 1.5 Width: (cm) 1.5 Depth: (cm) 0.1 Area: (cm) 1.767 Volume: (cm) 0.177 % Reduction in Area: 6.3% % Reduction in Volume: 5.9% Epithelialization: None Tunneling: No Undermining: No Wound Description Classification: Unclassifiable Foul Odor A Diabetic Severity (Wagner): Grade 1 Wound Margin: Flat and Intact Exudate Amount: None Present fter Cleansing: No Wound Bed Granulation Amount: None Present (0%) Exposed Structure Necrotic Amount: Large (67-100%) Fascia Exposed: No Necrotic Quality: Eschar Fat Layer Exposed: No Tendon Exposed: No EBBA, GOLL ([id number]) Muscle Exposed: No Joint Exposed: No Bone  Exposed: No Limited to Skin Breakdown Periwound Skin Texture Texture Color No Abnormalities Noted: No No Abnormalities Noted: No Callus: No Atrophie Blanche: No Crepitus: No Cyanosis: No Excoriation: No Ecchymosis: No Fluctuance: No Erythema: Yes Friable: No Erythema Location: Circumferential Induration: No Hemosiderin Staining: No Localized Edema: Yes Mottled: No Rash: No Pallor: No Scarring: No Rubor: No Moisture Temperature / Pain No Abnormalities Noted: No Tenderness on Palpation: Yes Dry / Scaly: Yes Maceration: No Moist: No Wound Preparation Ulcer Cleansing: Rinsed/Irrigated with Saline Topical Anesthetic Applied: Other: lidocaine 4%, Electronic Signature(s) Signed: 11/01/2015 5:15:14 PM By: Elpidio Eric BSN, RN Entered By: Elpidio Eric on 11/01/2015 14:34:59 [name] ([id number]) -------------------------------------------------------------------------------- Wound Assessment Details Patient Name: [name] Date of Service: 11/01/2015 1:30 PM Medical Record Number: [id number] Patient Account Number: [id number] Date of Birth/Sex: Apr 19, 1934 (81 y.o. 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 Wounding Event: Not Known Etiology: Extremity Date Acquired: 09/02/2015 Wound Status: Open Weeks Of Treatment: 4 Comorbid Arrhythmia, Congestive Heart Clustered Wound: No History: Failure, Type II Diabetes Pending Amputation On Presentation Wound Measurements Length: (cm) 5.5 Width: (cm) 6 Depth: (cm) 0.3 Area: (cm) 25.918 Volume: (cm) 7.775 % Reduction in Area: -79.4% % Reduction in Volume: -79.4% Epithelialization: None Tunneling:  No Undermining: No Wound Description Full Thickness Without Classification: Exposed Support Structures Diabetic Severity Grade 1 (Wagner): Wound Margin: Flat and Intact Exudate Amount: Large Exudate Type: Serous Exudate Color: amber Foul Odor After Cleansing: Yes Due to Product Use: No Wound Bed Granulation Amount: Small (1-33%) Exposed Structure Granulation Quality: Pink Fascia Exposed: No Necrotic Amount: Large (67-100%) Fat Layer Exposed: No Necrotic Quality: Eschar, Adherent Slough Tendon Exposed: No Muscle Exposed: No Joint Exposed: No Bone Exposed: No Limited to Skin Breakdown Periwound Skin Texture Texture Color No Abnormalities Noted: No No Abnormalities Noted: No KAMILAH, CORREIA ([id number]) Callus: No Atrophie Blanche: No Crepitus: No Cyanosis: No Excoriation: No Ecchymosis: No Fluctuance: No Erythema: Yes Friable: No Erythema Location: Circumferential Induration: No Hemosiderin Staining: No Localized Edema: Yes Mottled: No Rash: No Pallor: No Scarring: No Rubor: No Moisture Temperature / Pain No Abnormalities Noted: No Temperature: Cool/Cold Dry / Scaly: No Tenderness on Palpation: Yes Maceration: No Moist: Yes Wound Preparation Ulcer Cleansing: Rinsed/Irrigated with Saline Topical Anesthetic Applied: Other: lidocaine 4%, Electronic Signature(s) Signed: 11/01/2015 5:15:14 PM By: Elpidio Eric BSN, RN Entered By: Elpidio Eric on 11/01/2015 14:35:19 [name] ([id number]) -------------------------------------------------------------------------------- Wound Assessment Details Patient Name: [name] Date of Service: 11/01/2015 1:30  PM Medical Record Number: [id number] Patient Account Number: [id number] Date of Birth/Sex: 13-Mar-1934 (81 y.o. 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 - Lateral Secondary Diabetic Wound/Ulcer of the Lower Wounding Event: Not Known Etiology: Extremity Date Acquired: 09/02/2015 Wound Status: Open Weeks Of Treatment: 4 Comorbid Arrhythmia, Congestive Heart Clustered Wound: Yes History: Failure, Type II Diabetes Pending Amputation On Presentation Photos Photo Uploaded By: Elpidio Eric on 11/01/2015 17:11:17 Wound Measurements Length: (cm) 6 Width: (cm) 3.5 Depth: (cm) 0.1 Area: (cm) 16.493 Volume: (cm) 1.649 % Reduction in Area: 81.1% % Reduction in Volume: 90.5% Epithelialization: Small (1-33%) Tunneling: No Undermining: No Wound Description Full Thickness Without Classification: Exposed Support Structures Diabetic Severity Grade 1 (Wagner): Wound Margin: Flat and Intact Exudate Amount: Medium Exudate Type: Serous Exudate Color: amber Foul Odor After Cleansing: Yes Due to Product Use: No Wound Bed Granulation Amount: None Present (0%) Exposed Structure LIA, VIGILANTE ([id number]) Necrotic Amount: Large (67-100%) Fascia Exposed: No Necrotic Quality: Eschar, Adherent Slough Fat Layer Exposed: No Tendon Exposed: No Muscle Exposed: No Joint Exposed: No Bone Exposed: No Limited to Skin Breakdown Periwound Skin Texture Texture Color No Abnormalities Noted: No No Abnormalities Noted: No Callus: No Atrophie Blanche: No Crepitus: No Cyanosis: No Excoriation: No Ecchymosis: No Fluctuance: No Erythema: Yes Friable: No Erythema Location: Circumferential Induration: No Hemosiderin Staining: No Localized Edema: Yes Mottled: No Rash: No Pallor: No Scarring: No Rubor: No Moisture Temperature / Pain No Abnormalities Noted: No Tenderness on Palpation: Yes Dry / Scaly: Yes Maceration: No Moist: Yes Wound Preparation Ulcer Cleansing: Rinsed/Irrigated with Saline Topical Anesthetic  Applied: Other: lidocaine 4%, Electronic Signature(s) Signed: 11/01/2015 5:15:14 PM By: Elpidio Eric BSN, RN Entered By: Elpidio Eric on 11/01/2015 14:35:38 [name] ([id number]) -------------------------------------------------------------------------------- Wound Assessment Details Patient Name: [name] Date of Service: 11/01/2015 1:30 PM Medical Record Number: [id number] Patient Account Number: [id number] Date of Birth/Sex: [birth year] (81 y.o. Female) Treating RN: Afful, RN, BSN, Rita Primary Care Physician: [name] Other Clinician: Referring Physician: [name] Treating Physician/Extender: Rudene Re in Treatment: 4 Wound Status Wound Number: 6 Primary Trauma, Other Etiology: Wound Location: Left Abdomen - Lower Quadrant Wound Open Status: Wounding Event: Trauma Comorbid Arrhythmia, Congestive Heart Date Acquired: 06/10/2015 History: Failure, Type II Diabetes Weeks Of Treatment: 4 Clustered Wound: No Photos Photo Uploaded By: Elpidio Eric on 11/01/2015 17:11:41 Wound Measurements Length: (cm) 4 Width: (cm) 10.2 Depth: (cm) 0.1 Area: (cm) 32.044 Volume: (cm) 3.204 % Reduction in Area: -28.3% % Reduction in Volume: -28.3% Epithelialization: None Tunneling: No Undermining: No Wound Description Classification: Unclassifiable Wound Margin: Flat and Intact Exudate Amount: Small Foul Odor After Cleansing: Yes Due to Product Use: No Wound Bed Granulation Amount: None Present (0%) Exposed Structure Necrotic Amount: Large (67-100%) Fascia Exposed: No Necrotic Quality: Eschar Fat Layer Exposed: No Tendon Exposed: No Muscle Exposed: No CHELSEI, MCCHESNEY ([id number]) Joint Exposed: No Bone Exposed: No Limited to Skin Breakdown Periwound Skin Texture Texture Color No Abnormalities Noted: No No Abnormalities Noted: No Callus: No Atrophie Blanche: No Crepitus: No Cyanosis: No Excoriation: No Ecchymosis: No Fluctuance:  No Erythema: Yes Friable: No Erythema Location: Circumferential Induration: No Hemosiderin Staining: No Localized Edema: Yes Mottled: No Rash: No Pallor: No Scarring: No Rubor: No Moisture Temperature / Pain No Abnormalities Noted: No Tenderness on Palpation:  Yes Dry / Scaly: Yes Maceration: No Moist: No Wound Preparation Ulcer Cleansing: Rinsed/Irrigated with Saline Topical Anesthetic Applied: Other: lidocaine 4%, Electronic Signature(s) Signed: 11/01/2015 5:15:14 PM By: Elpidio EricAfful, Rita BSN, RN Entered By: Elpidio EricAfful, Rita on 11/01/2015 14:36:34 Natalie RimaHOPKINS, Osie Y. ([id number]) -------------------------------------------------------------------------------- Wound Assessment Details Patient Name: Natalie RimaHOPKINS, Perlita Y. Date of Service: 11/01/2015 1:30 PM Medical Record Number: [id number] Patient Account Number: [id number] Date of Birth/Sex: Apr 20, 1934 (81 y.o. Female) Treating RN: Afful, RN, BSN, Rita Primary Care Physician: Rolm GalaGrandis, Heidi Other Clinician: Referring Physician: Rolm GalaGrandis, Heidi Treating Physician/Extender: Rudene ReBritto, Errol Weeks in Treatment: 4 Wound Status Wound Number: 7 Primary Trauma, Other Etiology: Wound Location: Abdomen - midline - Distal Wound Open Wounding Event: Trauma Status: Date Acquired: 08/12/2015 Comorbid Arrhythmia, Congestive Heart Weeks Of Treatment: 4 History: Failure, Type II Diabetes Clustered Wound: No Photos Photo Uploaded By: Elpidio EricAfful, Rita on 11/01/2015 17:11:41 Wound Measurements Length: (cm) 6 Width: (cm) 3 Depth: (cm) 0.3 Area: (cm) 14.137 Volume: (cm) 4.241 % Reduction in Area: -435.7% % Reduction in Volume: -703.2% Epithelialization: None Tunneling: No Undermining: No Wound Description Classification: Unclassifiable Wound Margin: Flat and Intact Exudate Amount: Medium Exudate Type: Serous Exudate Color: amber Foul Odor After Cleansing: No Wound Bed Granulation Amount: None Present (0%) Exposed Structure Necrotic Amount: Large  (67-100%) Fascia Exposed: No Necrotic Quality: Eschar, Adherent Slough Fat Layer Exposed: No Tendon Exposed: No Natalie RimaHOPKINS, Shevy Y. ([id number]) Muscle Exposed: No Joint Exposed: No Bone Exposed: No Limited to Skin Breakdown Periwound Skin Texture Texture Color No Abnormalities Noted: No No Abnormalities Noted: No Callus: No Atrophie Blanche: No Crepitus: No Cyanosis: No Excoriation: No Ecchymosis: No Fluctuance: No Erythema: Yes Friable: No Erythema Location: Circumferential Induration: No Hemosiderin Staining: No Localized Edema: No Mottled: No Rash: No Pallor: No Scarring: No Rubor: No Moisture Temperature / Pain No Abnormalities Noted: No Tenderness on Palpation: Yes Dry / Scaly: No Maceration: No Moist: Yes Wound Preparation Ulcer Cleansing: Rinsed/Irrigated with Saline Topical Anesthetic Applied: Other: lidocaine 4%, Electronic Signature(s) Signed: 11/01/2015 5:15:14 PM By: Elpidio EricAfful, Rita BSN, RN Entered By: Elpidio EricAfful, Rita on 11/01/2015 14:37:05 Natalie RimaHOPKINS, Aften Y. ([id number]) -------------------------------------------------------------------------------- Vitals Details Patient Name: Natalie RimaHOPKINS, Kevona Y. Date of Service: 11/01/2015 1:30 PM Medical Record Number: [id number] Patient Account Number: [id number] Date of Birth/Sex: Apr 20, 1934 (81 y.o. Female) Treating RN: Afful, RN, BSN, [place] Primary Care Physician: Rolm GalaGrandis, Heidi Other Clinician: Referring Physician: Rolm GalaGrandis, Heidi Treating Physician/Extender: Rudene ReBritto, Errol Weeks in Treatment: 4 Vital Signs Time Taken: 14:02 Temperature (F): 98.2 Height (in): 65 Pulse (bpm): 76 Weight (lbs): 159 Respiratory Rate (breaths/min): 16 Body Mass Index (BMI): 26.5 Blood Pressure (mmHg): 122/54 Reference Range: 80 - 120 mg / dl Electronic Signature(s) Signed: 11/01/2015 5:15:14 PM By: Elpidio EricAfful, Rita BSN, RN Entered By: Elpidio EricAfful, Rita on 11/01/2015 14:03:21

## 2016-01-30 NOTE — Progress Notes (Signed)
[name], Nayla Y. ([id number]) Visit Report for 01/28/2016 Arrival Information Details Patient Name: [name], Rotunda Y. Date of Service: 01/28/2016 10:45 AM Medical Record Patient Account Number: [id number] Number: Treating RN: Clover MealyAfful, RN, BSN, Rita 11/21/1933 (82 y.o. Other Clinician: Date of Birth/Sex: Female) Treating ROBSON, MICHAEL Primary Care Physician: Rolm GalaGrandis, Heidi Physician/Extender: G Referring Physician: Charolotte CapuchinGrandis, Heidi Weeks in Treatment: 6 Visit Information History Since Last Visit All ordered tests and consults were completed: No Patient Arrived: Ambulatory Added or deleted any medications: No Arrival Time: 10:31 Any new allergies or adverse reactions: No Accompanied By: son Had a fall or experienced change in No Transfer Assistance: None activities of daily living that may affect Patient Identification Verified: Yes risk of falls: Secondary Verification Process Yes Signs or symptoms of abuse/neglect since last No Completed: visito Patient Requires Transmission- No Hospitalized since last visit: No Based Precautions: Has Dressing in Place as Prescribed: Yes Patient Has Alerts: Yes Pain Present Now: No Patient Alerts: Patient on Blood Thinner DMII Warfarin ABI  Bilateral NO BP RIGHT ARM Electronic Signature(s) Signed: 01/28/2016 3:26:52 PM By: Elpidio EricAfful, Rita BSN, RN Entered By: Elpidio EricAfful, Rita on 01/28/2016 10:32:08 [name], Saria Y. ([id number]) -------------------------------------------------------------------------------- Encounter Discharge Information Details Patient Name: [name], Konni Y. Date of Service: 01/28/2016 10:45 AM Medical Record Patient Account Number: [id number] Number: Treating RN: Clover MealyAfful, RN, BSN, Rita 11/21/1933 (82 y.o. Other Clinician: Date of Birth/Sex: Female) Treating ROBSON, MICHAEL Primary Care Physician: Rolm GalaGrandis, Heidi Physician/Extender: G Referring Physician: Charolotte CapuchinGrandis, Heidi Weeks in Treatment: 6 Encounter  Discharge Information Items Discharge Pain Level: 0 Discharge Condition: Stable Ambulatory Status: Wheelchair Discharge Destination: Home Transportation: Private Auto Accompanied By: SELF Schedule Follow-up Appointment: No Medication Reconciliation completed and provided to Patient/Care No Deitrick Ferreri: Provided on Clinical Summary of Care: 01/28/2016 Form Type [facility] Signature[facility]s) Signed: 01/28/2016 3:26:52 PM By: Elpidio EricAfful, Rita BSN, RN Previous Signature: 01/28/2016 11:12:10 AM Version By: Gwenlyn PerkingMoore, Shelia Entered By: Elpidio EricAfful, Rita on 01/28/2016 11:14:26 [name], Lakrisha Y. ([id number]) -------------------------------------------------------------------------------- Lower Extremity Assessment Details Patient Name: [name], Syreeta Y. Date of Service: 01/28/2016 10:45 AM Medical Record Patient Account Number: [id number] Number: Treating RN: Clover MealyAfful, RN, BSN, Rita 11/21/1933 (82 y.o. Other Clinician: Date of Birth/Sex: Female) Treating ROBSON, MICHAEL Primary Care Physician: Rolm GalaGrandis, Heidi Physician/Extender: G Referring Physician: Charolotte CapuchinGrandis, Heidi Weeks in Treatment: 6 Vascular Assessment Pulses: Posterior Tibial Dorsalis Pedis Palpable: [Left:Yes] [Right:No] Doppler: [Right:Monophasic] Extremity colors, hair growth, and conditions: Extremity Color: [Left:Mottled] [Right:Mottled] Hair Growth on Extremity: [Left:No] [Right:No] Temperature of Extremity: [Left:Warm] [Right:Warm] Capillary Refill: [Right:< 3 seconds] Toe Nail Assessment Left: Right: Thick: Yes Yes Discolored: Yes Yes Deformed: No No Improper Length and Hygiene: No No Electronic Signature(s) Signed: 01/28/2016 3:26:52 PM By: Elpidio EricAfful, Rita BSN, RN Entered By: Elpidio EricAfful, Rita on 01/28/2016 10:35:10 [name], Hasana Y. ([id number]) -------------------------------------------------------------------------------- Multi Wound Chart Details Patient Name: [name], Illene Y. Date of Service: 01/28/2016  10:45 AM Medical Record Patient Account Number: [id number] Number: Treating RN: Clover MealyAfful, RN, BSN, Rita 11/21/1933 (82 y.o. Other Clinician: Date of Birth/Sex: Female) Treating ROBSON, MICHAEL Primary Care Physician: Rolm GalaGrandis, Heidi Physician/Extender: G Referring Physician: Charolotte CapuchinGrandis, Heidi Weeks in Treatment: 6 Vital Signs Height(in): 65 Pulse(bpm): 77 Weight(lbs): 160 Blood Pressure 97/81 (mmHg): Body Mass Index(BMI): 27 Temperature(F): 97.6 Respiratory Rate 18 (breaths/min): Photos: [10:No Photos] [11:No Photos] [12:No Photos] Wound Location: [10:Right Calcaneus] [11:Abdomen - Lower Quadrant - Midline] [12:Left Abdomen - Lower Quadrant] Wounding Event: [10:Gradually Appeared] [11:Trauma] [12:Trauma] 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] Comorbid History: [10:Arrhythmia, Congestive Arrhythmia, Congestive Heart Failure, Hypertension,  Type II Diabetes] [11:Heart Failure, Hypertension, Type II Diabetes] [12:Arrhythmia, Congestive Heart Failure, Hypertension, Type II Diabetes] Date Acquired: [10:06/02/2015] [11:08/12/2015] [12:06/10/2015] Weeks of Treatment: [10:6] [11:6] [12:6] Wound Status: [10:Open] [11:Open] [12:Open] Pending Amputation on Yes [11:No] [12:No] Presentation: Measurements L x W x D 1x1x0.1 [11:5x4.5x0.8] [12:1.5x8x1.1] (cm) Area (cm) : [10:0.785] [11:17.671] [12:9.425] Volume (cm) : [10:0.079] [11:14.137] [12:10.367] % Reduction in Area: [10:57.30%] [11:55.40%] [12:46.60%] % Reduction in Volume: 78.50% [11:82.10%] [12:73.30%] Starting Position 1 [11:12] (o'clock): Ending Position 1 [11:12] (o'clock): Maximum Distance 1 [11:1.5] (cm): Undermining: [10:No] [11:Yes] [12:No] Classification: LABREA, ECCLESTON ([id number]) Full Thickness Without Full Thickness Without Full Thickness Without Exposed Support Exposed Support Exposed Support Structures Structures Structures HBO Classification: Grade 1 N/A  N/A Exudate Amount: Medium Large Large Exudate Type: Serous Serous Serous Exudate Color: amber Media planner Foul Odor After Yes Yes Yes Cleansing: Odor Anticipated Due to No No No Product Use: Wound Margin: Flat and Intact Flat and Intact Flat and Intact Granulation Amount: Medium (34-66%) Large (67-100%) Large (67-100%) Granulation Quality: Pink Red Red Necrotic Amount: Medium (34-66%) Small (1-33%) Small (1-33%) Necrotic Tissue: Eschar, Adherent Slough Eschar, Adherent Slough [employer] Exposed Structures: Fascia: No Fascia: No Fascia: No Fat: No Fat: No Fat: No Tendon: No Tendon: No Tendon: No Muscle: No Muscle: No Muscle: No Joint: No Joint: No Joint: No Bone: No Bone: No Bone: No Limited to Skin Limited to Skin Limited to Skin Breakdown Breakdown Breakdown Epithelialization: None None None Periwound Skin Texture: Edema: No Edema: No Edema: No Excoriation: No Excoriation: No Excoriation: No Induration: No Induration: No Induration: No Callus: No Callus: No Callus: No Crepitus: No Crepitus: No Crepitus: No Fluctuance: No Fluctuance: No Fluctuance: No Friable: No Friable: No Friable: No Rash: No Rash: No Rash: No Scarring: No Scarring: No Scarring: No Periwound Skin Moist: Yes Moist: Yes Moist: Yes Moisture: Maceration: No Maceration: No Maceration: No Dry/Scaly: No Dry/Scaly: No Dry/Scaly: No Periwound Skin Color: Erythema: Yes Erythema: Yes Erythema: Yes Atrophie Blanche: No Atrophie Blanche: No Atrophie Blanche: No Cyanosis: No Cyanosis: No Cyanosis: No Ecchymosis: No Ecchymosis: No Ecchymosis: No Hemosiderin Staining: No Hemosiderin Staining: No Hemosiderin Staining: No Mottled: No Mottled: No Mottled: No Pallor: No Pallor: No Pallor: No Rubor: No Rubor: No Rubor: No Erythema Location: Circumferential Circumferential Circumferential Temperature: No Abnormality No Abnormality No Abnormality Tenderness on Yes Yes  Yes Palpation: Wound Preparation: SAVONNA, BIRCHMEIER ([id number]) Ulcer Cleansing: Ulcer Cleansing: Ulcer Cleansing: Rinsed/Irrigated with Rinsed/Irrigated with Rinsed/Irrigated with Saline Saline Saline Topical Anesthetic Topical Anesthetic Topical Anesthetic Applied: Other: lidocaine Applied: Other: lidocaine Applied: Other: lidocaine 4% 4% 4% Wound Number: [id number]  Photos: No Photos No Photos No Photos Wound Location: Left Calcaneus Right Amputation Site - Right, Lateral Lower Leg Digit Wounding Event: Gradually Appeared Surgical Injury Gradually Appeared Primary Etiology: Arterial Insufficiency Ulcer Open Surgical Wound Arterial Insufficiency Ulcer Comorbid History: Arrhythmia, Congestive Arrhythmia, Congestive N/A Heart Failure, Heart Failure, Hypertension, Type II Hypertension, Type II Diabetes Diabetes Date Acquired: 12/31/2015 11/11/2015 06/02/2015 Weeks of Treatment: 4 4 6  Wound Status: Open Open Healed - Epithelialized Pending Amputation on No No Yes Presentation: Measurements L x W x D 1x1x0.1 1x1x0.1 0x0x0 (cm) Area (cm) : 0.785 0.785 0 Volume (cm) : 0.079 0.079 0 % Reduction in Area: -203.10% -4.10% 100.00% % Reduction in Volume: -1.30% -5.30% 100.00% Undermining: No No N/A Classification: Full Thickness Without Full Thickness Without Full Thickness Without Exposed Support Exposed Support Exposed Support Structures Structures Structures HBO Classification: Grade 1 N/A N/A Exudate Amount:  Medium Medium N/A Exudate Type: Serosanguineous Serous N/A Exudate Color: red, brown amber N/A Foul Odor After No No N/A Cleansing: Odor Anticipated Due to N/A N/A N/A Product Use: Wound Margin: Flat and Intact Flat and Intact N/A Granulation Amount: None Present (0%) None Present (0%) N/A Granulation Quality: N/A N/A N/A Necrotic Amount: Large (67-100%) Large (67-100%) N/A Necrotic Tissue: Eschar, Adherent Slough Eschar, Adherent Slough N/A Exposed Structures: Fascia:  No Fascia: No N/A Fat: No Fat: No Tendon: No Tendon: No RUFUS, CYPERT ([id number]) Muscle: No Muscle: No Joint: No Joint: No Bone: No Bone: No Limited to Skin Limited to Skin Breakdown Breakdown Epithelialization: Medium (34-66%) None N/A Periwound Skin Texture: Edema: No Edema: No No Abnormalities Noted Excoriation: No Excoriation: No Induration: No Induration: No Callus: No Callus: No Crepitus: No Crepitus: No Fluctuance: No Fluctuance: No Friable: No Friable: No Rash: No Rash: No Scarring: No Scarring: No Periwound Skin Moist: Yes Maceration: Yes No Abnormalities Noted Moisture: Maceration: No Moist: Yes Dry/Scaly: No Dry/Scaly: No Periwound Skin Color: Atrophie Blanche: No Atrophie Blanche: No No Abnormalities Noted Cyanosis: No Cyanosis: No Ecchymosis: No Ecchymosis: No Erythema: No Erythema: No Hemosiderin Staining: No Hemosiderin Staining: No Mottled: No Mottled: No Pallor: No Pallor: No Rubor: No Rubor: No Erythema Location: N/A N/A N/A Temperature: No Abnormality No Abnormality N/A Tenderness on No No No Palpation: Wound Preparation: Ulcer Cleansing: Ulcer Cleansing: N/A Rinsed/Irrigated with Rinsed/Irrigated with Saline Saline Topical Anesthetic Topical Anesthetic Applied: Other: lidocaine Applied: Other: lidocaine 4% 4% Wound Number: 9 N/A N/A Photos: No Photos N/A N/A Wound Location: Right Achilles N/A N/A Wounding Event: Gradually Appeared N/A N/A Primary Etiology: Arterial Insufficiency Ulcer N/A N/A Comorbid History: Arrhythmia, Congestive N/A N/A Heart Failure, Hypertension, Type II Diabetes Date Acquired: 06/02/2015 N/A N/A Weeks of Treatment: 6 N/A N/A Wound Status: Open N/A N/A ASHTAN, LATON ([id number]) Pending Amputation on Yes N/A N/A Presentation: Measurements L x W x D 10.5x5x9.3 N/A N/A (cm) Area (cm) : 41.233 N/A N/A Volume (cm) : 383.471 N/A N/A % Reduction in Area: -26.50% N/A N/A % Reduction in  Volume: -3821.80% N/A N/A Undermining: No N/A N/A Classification: Full Thickness Without N/A N/A Exposed Support Structures HBO Classification: Grade 1 N/A N/A Exudate Amount: Large N/A N/A Exudate Type: N/A N/A N/A Exudate Color: N/A N/A N/A Foul Odor After Yes N/A N/A Cleansing: Odor Anticipated Due to No N/A N/A Product Use: Wound Margin: Thickened N/A N/A Granulation Amount: Large (67-100%) N/A N/A Granulation Quality: Red N/A N/A Necrotic Amount: Small (1-33%) N/A N/A Necrotic Tissue: Eschar, Adherent Slough N/A N/A Exposed Structures: Fascia: No N/A N/A Fat: No Tendon: No Muscle: No Joint: No Bone: No Limited to Skin Breakdown Epithelialization: None N/A N/A Periwound Skin Texture: Edema: No N/A N/A Excoriation: No Induration: No Callus: No Crepitus: No Fluctuance: No Friable: No Rash: No Scarring: No Periwound Skin Maceration: Yes N/A N/A Moisture: Moist: Yes Dry/Scaly: No Periwound Skin Color: Erythema: Yes N/A N/A Atrophie Blanche: No Cyanosis: No Ecchymosis: No CONSTANTINA, LASETER ([id number]) Hemosiderin Staining: No Mottled: No Pallor: No Rubor: No Erythema Location: Circumferential N/A N/A Temperature: N/A N/A N/A Tenderness on Yes N/A N/A Palpation: Wound Preparation: Ulcer Cleansing: N/A N/A Rinsed/Irrigated with Saline Topical Anesthetic Applied: Other: lidocaine 4% Treatment Notes Electronic Signature(s) Signed: 01/28/2016 3:26:52 PM By: Elpidio Eric BSN, RN Entered By: Elpidio Eric on 01/28/2016 10:57:06 Natalie Rima ([id number]) -------------------------------------------------------------------------------- Multi-Disciplinary Care Plan Details Patient Name: KEARY, HANAK. Date of Service: 01/28/2016 10:45 AM Medical Record Patient  Account Number: [id number] Number: Treating RN: Clover Mealy, RN, BSN, [place] 04/11/1934 (82 y.o. Other Clinician: Date of Birth/Sex: Female) Treating ROBSON, MICHAEL Primary Care Physician:  [name] Physician/Extender: G Referring Physician: Charolotte Capuchin in Treatment: 6 Active Inactive Abuse / Safety / Falls / Self Care Management Nursing Diagnoses: Impaired physical mobility Potential for falls Goals: Patient will remain injury free Date Initiated: 12/17/2015 Goal Status: Active Interventions: Assess fall risk on admission and as needed Notes: Wound/Skin Impairment Nursing Diagnoses: Impaired tissue integrity Goals: Patient/caregiver will verbalize understanding of skin care regimen Date Initiated: 12/17/2015 Goal Status: Active Ulcer/skin breakdown will have a volume reduction of 30% by week 4 Date Initiated: 12/17/2015 Goal Status: Active Ulcer/skin breakdown will have a volume reduction of 50% by week 8 Date Initiated: 12/17/2015 Goal Status: Active Ulcer/skin breakdown will have a volume reduction of 80% by week 12 Date Initiated: 12/17/2015 Goal Status: Active Ulcer/skin breakdown will heal within 14 weeks GAL, SMOLINSKI ([id number]) Date Initiated: 12/17/2015 Goal Status: Active Interventions: Assess patient/caregiver ability to obtain necessary supplies Assess patient/caregiver ability to perform ulcer/skin care regimen upon admission and as needed Assess ulceration(s) every visit Notes: Electronic Signature(s) Signed: 01/28/2016 3:26:52 PM By: Elpidio Eric BSN, RN Entered By: Elpidio Eric on 01/28/2016 10:56:49 Natalie Rima ([id number]) -------------------------------------------------------------------------------- Pain Assessment Details Patient Name: Natalie Rima Date of Service: 01/28/2016 10:45 AM Medical Record Patient Account Number: [id number] Number: Treating RN: Clover Mealy, RN, BSN, Rita Jun 01, 1934 (82 y.o. Other Clinician: Date of Birth/Sex: Female) Treating ROBSON, MICHAEL Primary Care Physician: [name] Physician/Extender: G Referring Physician: Charolotte Capuchin in Treatment: 6 Active  Problems Location of Pain Severity and Description of Pain Patient Has Paino No Site Locations With Dressing Change: No Pain Management and Medication Current Pain Management: Electronic Signature(s) Signed: 01/28/2016 3:26:52 PM By: Elpidio Eric BSN, RN Entered By: Elpidio Eric on 01/28/2016 10:32:17 Natalie Rima ([id number]) -------------------------------------------------------------------------------- Patient/Caregiver Education Details Patient Name: Natalie Rima Date of Service: 01/28/2016 10:45 AM Medical Record Patient Account Number: [id number] Number: Treating RN: Clover Mealy, RN, BSN, Rita 1933/11/23 (82 y.o. Other Clinician: Date of Birth/Gender: Female) Treating ROBSON, MICHAEL Primary Care Physician: [name] Physician/Extender: G Referring Physician: Charolotte Capuchin in Treatment: 6 Education Assessment Education Provided To: Patient Education Topics Provided Wound/Skin Impairment: Methods: Explain/Verbal Responses: State content correctly Electronic Signature(s) Signed: 01/28/2016 3:26:52 PM By: Elpidio Eric BSN, RN Entered By: Elpidio Eric on 01/28/2016 11:14:40 Natalie Rima ([id number]) -------------------------------------------------------------------------------- Wound Assessment Details Patient Name: Natalie Rima. Date of Service: 01/28/2016 10:45 AM Medical Record Patient Account Number: [id number] Number: Treating RN: Clover Mealy, RN, BSN, Rita 1933-08-05 (82 y.o. Other Clinician: Date of Birth/Sex: Female) Treating ROBSON, MICHAEL Primary Care Physician: [name] Physician/Extender: G Referring Physician: Charolotte Capuchin in Treatment: 6 Wound Status Wound Number: 10 Primary Arterial Insufficiency Ulcer Etiology: Wound Location: Right Calcaneus Wound Open Wounding Event: Gradually Appeared Status: Date Acquired: 06/02/2015 Comorbid Arrhythmia, Congestive Heart Failure, Weeks Of Treatment: 6 History:  Hypertension, Type II Diabetes Clustered Wound: No Pending Amputation On Presentation Photos Photo Uploaded By: Elpidio Eric on 01/28/2016 15:22:25 Wound Measurements Length: (cm) 1 % Reduction in Width: (cm) 1 % Reduction in Depth: (cm) 0.1 Epithelializat Area: (cm) 0.785 Tunneling: Volume: (cm) 0.079 Undermining: Area: 57.3% Volume: 78.5% ion: None No No Wound Description Full Thickness Without Foul Odor Aft Classification: Exposed Support Structures Due to Produc Diabetic Severity Grade 1 (Wagner): Wound Margin: Flat and Intact Exudate Amount: Medium Exudate Type: Serous Exudate Color:  [address] ARIYONA, EID ([id number]) er Cleansing: Yes t Use: No Wound Bed Granulation Amount: Medium (34-66%) Exposed Structure Granulation Quality: Pink Fascia Exposed: No Necrotic Amount: Medium (34-66%) Fat Layer Exposed: No Necrotic Quality: Eschar, Adherent Slough Tendon Exposed: No Muscle Exposed: No Joint Exposed: No Bone Exposed: No Limited to Skin Breakdown Periwound Skin Texture Texture Color No Abnormalities Noted: No No Abnormalities Noted: No Callus: No Atrophie Blanche: No Crepitus: No Cyanosis: No Excoriation: No Ecchymosis: No Fluctuance: No Erythema: Yes Friable: No Erythema Location: Circumferential Induration: No Hemosiderin Staining: No Localized Edema: No Mottled: No Rash: No Pallor: No Scarring: No Rubor: No Moisture Temperature / Pain No Abnormalities Noted: No Temperature: No Abnormality Dry / Scaly: No Tenderness on Palpation: Yes Maceration: No Moist: Yes Wound Preparation Ulcer Cleansing: Rinsed/Irrigated with Saline Topical Anesthetic Applied: Other: lidocaine 4%, Electronic Signature(s) Signed: 01/28/2016 3:26:52 PM By: Elpidio Eric BSN, RN Entered By: Elpidio Eric on 01/28/2016 10:50:18 Natalie Rima ([id number]) -------------------------------------------------------------------------------- Wound Assessment  Details Patient Name: Natalie Rima Date of Service: 01/28/2016 10:45 AM Medical Record Patient Account Number: [id number] Number: Treating RN: Clover Mealy, RN, BSN, Rita 06/15/1933 (82 y.o. 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: Wound Location: Abdomen - Lower Quadrant - Midline Wound Open Status: Wounding Event: Trauma Comorbid Arrhythmia, Congestive Heart Failure, Date Acquired: 08/12/2015 History: Hypertension, Type II Diabetes Weeks Of Treatment: 6 Clustered Wound: No Photos Photo Uploaded By: Elpidio Eric on 01/28/2016 15:22:58 Wound Measurements Length: (cm) 5 % Reduction in Width: (cm) 4.5 % Reduction in Depth: (cm) 0.8 Epithelializat Area: (cm) 17.671 Tunneling: Volume: (cm) 14.137 Undermining: Starting Po Ending Posi Maximum Dis Area: 55.4% Volume: 82.1% ion: None No Yes sition (o'clock): 12 tion (o'clock): 12 tance: (cm) 1.5 Wound Description Full Thickness Without Exposed Foul Odor Aft Classification: Support Structures Due to Produc Wound Margin: Flat and Intact Exudate Large Amount: ANJEL, PERFETTI ([id number]) er Cleansing: Yes t Use: No Exudate Type: Serous Exudate Color: amber Wound Bed Granulation Amount: Large (67-100%) Exposed Structure Granulation Quality: Red Fascia Exposed: No Necrotic Amount: Small (1-33%) Fat Layer Exposed: No Necrotic Quality: Eschar, Adherent Slough Tendon Exposed: No Muscle Exposed: No Joint Exposed: No Bone Exposed: No Limited to Skin Breakdown Periwound Skin Texture Texture Color No Abnormalities Noted: No No Abnormalities Noted: No Callus: No Atrophie Blanche: No Crepitus: No Cyanosis: No Excoriation: No Ecchymosis: No Fluctuance: No Erythema: Yes Friable: No Erythema  Location: Circumferential Induration: No Hemosiderin Staining: No Localized Edema: No Mottled: No Rash: No Pallor: No Scarring: No Rubor: No Moisture Temperature / Pain No Abnormalities Noted: No Temperature: No Abnormality Dry / Scaly: No Tenderness on Palpation: Yes Maceration: No Moist: Yes Wound Preparation Ulcer Cleansing: Rinsed/Irrigated with Saline Topical Anesthetic Applied: Other: lidocaine 4%, Treatment Notes Wound #11 (Midline Abdomen - Lower Quadrant) 1. Cleansed with: Clean wound with Normal Saline 2. Anesthetic Topical Lidocaine 4% cream to wound bed prior to debridement 4. Dressing Applied: Aquacel Ag 5. Secondary Dressing Applied Guaze, ABD and kerlix/Conform 7. Secured with MEKAYLAH, KLICH ([id number]) Tape Electronic Signature(s) Signed: 01/28/2016 3:26:52 PM By: Elpidio Eric BSN, RN Entered By: Elpidio Eric on 01/28/2016 10:51:03 Natalie Rima ([id number]) -------------------------------------------------------------------------------- Wound Assessment Details Patient Name: Natalie Rima Date of Service: 01/28/2016 10:45 AM Medical Record Patient Account Number: [id number] Number: Treating RN: Clover Mealy, RN, BSN, Rita [birth year] (82 y.o. Other Clinician:  Date of Birth/Sex: Female) Treating ROBSON, MICHAEL Primary Care Physician: [name] Physician/Extender: G Referring Physician: Charolotte Capuchin in Treatment: 6 Wound Status Wound Number: 12 Primary Calciphylaxis Etiology: Wound Location: Left Abdomen - Lower Quadrant Wound Open Status: Wounding Event: Trauma Comorbid Arrhythmia, Congestive Heart Failure, Date Acquired: 06/10/2015 History: Hypertension, Type II Diabetes Weeks Of Treatment: 6 Clustered Wound: No Photos Photo Uploaded By: Elpidio Eric on 01/28/2016 15:22:59 Wound Measurements Length: (cm) 1.5 Width: (cm) 8 Depth: (cm) 1.1 Area: (cm) 9.425 Volume: (cm) 10.367 % Reduction in Area: 46.6% %  Reduction in Volume: 73.3% Epithelialization: None Tunneling: No Undermining: No Wound Description Full Thickness Without Exposed Classification: Support Structures KYIRA, VOLKERT ([id number]) Foul Odor After Cleansing: Yes Due to Product Use: No Wound Margin: Flat and Intact Exudate Large Amount: Exudate Type: Serous Exudate Color: amber Wound Bed Granulation Amount: Large (67-100%) Exposed Structure Granulation Quality: Red Fascia Exposed: No Necrotic Amount: Small (1-33%) Fat Layer Exposed: No Necrotic Quality: Adherent Slough Tendon Exposed: No Muscle Exposed: No Joint Exposed: No Bone Exposed: No Limited to Skin Breakdown Periwound Skin Texture Texture Color No Abnormalities Noted: No No Abnormalities Noted: No Callus: No Atrophie Blanche: No Crepitus: No Cyanosis: No Excoriation: No Ecchymosis: No Fluctuance: No Erythema: Yes Friable: No Erythema Location: Circumferential Induration: No Hemosiderin Staining: No Localized Edema: No Mottled: No Rash: No Pallor: No Scarring: No Rubor: No Moisture Temperature / Pain No Abnormalities Noted: No Temperature: No Abnormality Dry / Scaly: No Tenderness on Palpation: Yes Maceration: No Moist: Yes Wound Preparation Ulcer Cleansing: Rinsed/Irrigated with Saline Topical Anesthetic Applied: Other: lidocaine 4%, Treatment Notes Wound #12 (Left Abdomen - Lower Quadrant) 1. Cleansed with: Clean wound with Normal Saline 2. Anesthetic Topical Lidocaine 4% cream to wound bed prior to debridement 4. Dressing Applied: Aquacel Ag CAMILLA, SKEEN ([id number]) 5. Secondary Dressing Applied Guaze, ABD and kerlix/Conform 7. Secured with Secretary/administrator) Signed: 01/28/2016 3:26:52 PM By: Elpidio Eric BSN, RN Entered By: Elpidio Eric on 01/28/2016 10:52:08 Natalie Rima ([id number]) -------------------------------------------------------------------------------- Wound Assessment Details Patient  Name: Natalie Rima Date of Service: 01/28/2016 10:45 AM Medical Record Patient Account Number: [id number] Number: Treating RN: Clover Mealy, RN, BSN, Rita [birth year] (82 y.o. 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 Wound Open Wounding Event: Gradually Appeared Status: Date Acquired: 12/31/2015 Comorbid Arrhythmia, Congestive Heart Failure, Weeks Of Treatment: 4 History: Hypertension, Type II Diabetes Clustered Wound: No Photos Photo Uploaded By: Elpidio Eric on 01/28/2016 15:23:54 Wound Measurements Length: (cm) 1 Width: (cm) 1 Depth: (cm) 0.1 Area: (cm) 0.785 Volume: (cm) 0.079 % Reduction in Area: -203.1% % Reduction in Volume: -1.3% Epithelialization: Medium (34-66%) Tunneling: No Undermining: No Wound Description Full Thickness Without Exposed Foul Odor Classification: Support Structures Grade 1 EMILYGRACE, GROTHE ([id number]) After Cleansing: No Diabetic Severity [address]): Wound Margin: Flat and Intact Exudate Amount: Medium Exudate Type: Serosanguineous Exudate Color: red, brown Wound Bed Granulation Amount: None Present (0%) Exposed Structure Necrotic Amount: Large (67-100%) Fascia Exposed: No Necrotic Quality: Eschar, Adherent Slough Fat Layer Exposed: No Tendon Exposed: No Muscle Exposed: No Joint Exposed: No Bone Exposed: No Limited to Skin Breakdown Periwound Skin Texture Texture Color No Abnormalities Noted: No No Abnormalities Noted: No Callus: No Atrophie Blanche: No Crepitus: No Cyanosis: No Excoriation: No Ecchymosis: No Fluctuance: No Erythema: No Friable: No Hemosiderin Staining:  No Induration: No Mottled: No Localized Edema: No Pallor: No Rash: No Rubor:  No Scarring: No Temperature / Pain Moisture Temperature: No Abnormality No Abnormalities Noted: No Dry / Scaly: No Maceration: No Moist: Yes Wound Preparation Ulcer Cleansing: Rinsed/Irrigated with Saline Topical Anesthetic Applied: Other: lidocaine 4%, Electronic Signature(s) Signed: 01/28/2016 3:26:52 PM By: Elpidio Eric BSN, RN Entered By: Elpidio Eric on 01/28/2016 10:52:44 Natalie Rima ([id number]) -------------------------------------------------------------------------------- Wound Assessment Details Patient Name: Natalie Rima Date of Service: 01/28/2016 10:45 AM Medical Record Patient Account Number: [id number] Number: Treating RN: Clover Mealy, RN, BSN, Rita July 17, 1933 (82 y.o. Other Clinician: Date of Birth/Sex: Female) Treating ROBSON, MICHAEL Primary Care Physician: [name] Physician/Extender: G Referring Physician: Charolotte Capuchin in Treatment: 6 Wound Status Wound Number: 14 Primary Open Surgical Wound Etiology: Wound Location: Right Amputation Site - Digit Wound Open Wounding Event: Surgical Injury Status: Date Acquired: 11/11/2015 Comorbid Arrhythmia, Congestive Heart Failure, Weeks Of Treatment: 4 History: Hypertension, Type II Diabetes Clustered Wound: No Photos Photo Uploaded By: Elpidio Eric on 01/28/2016 15:24:24 Wound Measurements Length: (cm) 1 Width: (cm) 1 Depth: (cm) 0.1 Area: (cm) 0.785 Volume: (cm) 0.079 % Reduction in Area: -4.1% % Reduction in Volume: -5.3% Epithelialization: None Tunneling: No Undermining: No Wound Description Full Thickness Without Exposed Classification: Support Structures Wound Margin: Flat and Intact Exudate Medium Amount: Exudate Type: Serous Exudate Color: amber Foul Odor After Cleansing: No Wound Bed Granulation Amount: None Present (0%) Exposed Structure NIEMA, CARRARA ([id number]) Necrotic Amount: Large (67-100%) Fascia Exposed: No Necrotic Quality: Eschar, Adherent  Slough Fat Layer Exposed: No Tendon Exposed: No Muscle Exposed: No Joint Exposed: No Bone Exposed: No Limited to Skin Breakdown Periwound Skin Texture Texture Color No Abnormalities Noted: No No Abnormalities Noted: No Callus: No Atrophie Blanche: No Crepitus: No Cyanosis: No Excoriation: No Ecchymosis: No Fluctuance: No Erythema: No Friable: No Hemosiderin Staining: No Induration: No Mottled: No Localized Edema: No Pallor: No Rash: No Rubor: No Scarring: No Temperature / Pain Moisture Temperature: No Abnormality No Abnormalities Noted: No Dry / Scaly: No Maceration: Yes Moist: Yes Wound Preparation Ulcer Cleansing: Rinsed/Irrigated with Saline Topical Anesthetic Applied: Other: lidocaine 4%, Treatment Notes Wound #14 (Right Amputation Site - Digit) 1. Cleansed with: Clean wound with Normal Saline 2. Anesthetic Topical Lidocaine 4% cream to wound bed prior to debridement 4. Dressing Applied: Aquacel Ag 5. Secondary Dressing Applied Guaze, ABD and kerlix/Conform 7. Secured with Secretary/administrator) Signed: 01/28/2016 3:26:52 PM By: Elpidio Eric BSN, RN Entered By: Elpidio Eric on 01/28/2016 10:53:08 AKEYLA, MOLDEN ([id number]) NATAHSA, MARIAN ([id number]) -------------------------------------------------------------------------------- Wound Assessment Details Patient Name: AHILYN, NELL. Date of Service: 01/28/2016 10:45 AM Medical Record Patient Account Number: [id number] Number: Treating RN: Clover Mealy, RN, BSN, Rita [birth year] (82 y.o. Other Clinician: Date of Birth/Sex: Female) Treating ROBSON, MICHAEL Primary Care Physician: [name] Physician/Extender: G Referring Physician: Charolotte Capuchin in Treatment: 6 Wound Status Wound Number: 8 Primary Arterial Insufficiency Ulcer Etiology: Wound Location: Right Lower Leg - Lateral Wound Healed - Epithelialized Wounding Event: Gradually Appeared Status: Date Acquired:  06/02/2015 Comorbid Arrhythmia, Congestive Heart Failure, Weeks Of Treatment: 6 History: Hypertension, Type II Diabetes Clustered Wound: No Pending Amputation On Presentation Photos Photo Uploaded By: Elpidio Eric on 01/28/2016 15:23:54 Wound Measurements Length: (cm) 0 % Reduction i Width: (cm) 0 % Reduction i Depth: (cm) 0 Epithelializa Area: (cm) 0 Tunneling: Volume: (cm) 0 Undermining: n Area: 100% n Volume: 100% tion: Large (67-100%) No No Wound Description Full Thickness Without Classification:  Exposed Support Structures Diabetic Severity Grade 1 (Wagner): Wound Margin: Flat and Intact Exudate Amount: None Present Foul Odor After Cleansing: Yes Due to Product Use: No Wound Bed Granulation Amount: None Present (0%) Exposed Structure GREGORIA, SELVY ([id number]) Necrotic Amount: None Present (0%) Fascia Exposed: No Fat Layer Exposed: No Tendon Exposed: No Muscle Exposed: No Joint Exposed: No Bone Exposed: No Limited to Skin Breakdown Periwound Skin Texture Texture Color No Abnormalities Noted: No No Abnormalities Noted: No Callus: No Atrophie Blanche: No Crepitus: No Cyanosis: No Excoriation: No Ecchymosis: No Fluctuance: No Erythema: Yes Friable: No Erythema Location: Circumferential Induration: No Hemosiderin Staining: No Localized Edema: No Mottled: No Rash: No Pallor: No Scarring: No Rubor: No Moisture Temperature / Pain No Abnormalities Noted: No Temperature: No Abnormality Dry / Scaly: Yes Maceration: No Moist: No Wound Preparation Ulcer Cleansing: Rinsed/Irrigated with Saline Topical Anesthetic Applied: None Electronic Signature(s) Signed: 01/28/2016 3:26:52 PM By: Elpidio Eric BSN, RN Entered By: Elpidio Eric on 01/28/2016 11:28:32 Natalie Rima ([id number]) -------------------------------------------------------------------------------- Wound Assessment Details Patient Name: Natalie Rima Date of Service: 01/28/2016  10:45 AM Medical Record Patient Account Number: [id number] Number: Treating RN: Clover Mealy, RN, BSN, Rita 08/22/1933 (82 y.o. Other Clinician: Date of Birth/Sex: Female) Treating ROBSON, MICHAEL Primary Care Physician: [name] Physician/Extender: G Referring Physician: Charolotte Capuchin in Treatment: 6 Wound Status Wound Number: 9 Primary Diabetic Wound/Ulcer of the Lower Etiology: Extremity Wound Location: Right Achilles Wound Open Wounding Event: Gradually Appeared Status: Date Acquired: 06/02/2015 Comorbid Arrhythmia, Congestive Heart Failure, Weeks Of Treatment: 6 History: Hypertension, Type II Diabetes Clustered Wound: No Pending Amputation On Presentation Photos Photo Uploaded By: Elpidio Eric on 01/28/2016 15:24:24 Wound Measurements Length: (cm) 10.5 Width: (cm) 5 Depth: (cm) 0.3 Area: (cm) 41.233 Volume: (cm) 12.37 % Reduction in Area: -26.5% % Reduction in Volume: -26.5% Epithelialization: None Tunneling: No Undermining: No Wound Description Full Thickness Without Classification: Exposed Support Structures Diabetic Severity Grade 1 (Wagner): Wound Margin: Thickened Exudate Amount: Large Foul Odor After Cleansing: Yes Due to Product Use: No Wound Bed Granulation Amount: Large (67-100%) Exposed Structure CAMILAH, SPILLMAN ([id number]) Granulation Quality: Red Fascia Exposed: No Necrotic Amount: Small (1-33%) Fat Layer Exposed: No Necrotic Quality: Eschar, Adherent Slough Tendon Exposed: No Muscle Exposed: No Joint Exposed: No Bone Exposed: No Limited to Skin Breakdown Periwound Skin Texture Texture Color No Abnormalities Noted: No No Abnormalities Noted: No Callus: No Atrophie Blanche: No Crepitus: No Cyanosis: No Excoriation: No Ecchymosis: No Fluctuance: No Erythema: Yes Friable: No Erythema Location: Circumferential Induration: No Hemosiderin Staining: No Localized Edema: No Mottled: No Rash: No Pallor:  No Scarring: No Rubor: No Moisture Temperature / Pain No Abnormalities Noted: No Tenderness on Palpation: Yes Dry / Scaly: No Maceration: Yes Moist: Yes Wound Preparation Ulcer Cleansing: Rinsed/Irrigated with Saline Topical Anesthetic Applied: Other: lidocaine 4%, Treatment Notes Wound #9 (Right Achilles) 1. Cleansed with: Clean wound with Normal Saline 2. Anesthetic Topical Lidocaine 4% cream to wound bed prior to debridement 4. Dressing Applied: Aquacel Ag 5. Secondary Dressing Applied Guaze, ABD and kerlix/Conform 7. Secured with Secretary/administrator) Signed: 01/28/2016 3:26:52 PM By: Elpidio Eric BSN, RN Entered By: Elpidio Eric on 01/28/2016 13:28:34 JAYDENCE, VANYO ([id number]) Natalie Rima ([id number]) -------------------------------------------------------------------------------- Vitals Details Patient Name: KENNDRA, MORRIS. Date of Service: 01/28/2016 10:45 AM Medical Record Patient Account Number: [id number] Number: Treating RN: Clover Mealy, RN, BSN, Rita [birth year] (82 y.o. Other Clinician: Date of Birth/Sex: Female) Treating ROBSON, MICHAEL Primary Care Physician: [name] Physician/Extender: Reece Agar  Referring Physician: Charolotte Capuchin in Treatment: 6 Vital Signs Time Taken: 10:32 Temperature (F): 97.6 Height (in): 65 Pulse (bpm): 77 Weight (lbs): 160 Respiratory Rate (breaths/min): 18 Body Mass Index (BMI): 26.6 Blood Pressure (mmHg): 97/81 Reference Range: 80 - 120 mg / dl Electronic Signature(s) Signed: 01/28/2016 3:26:52 PM By: Elpidio Eric BSN, RN Entered By: Elpidio Eric on 01/28/2016 10:34:20

## 2016-01-30 NOTE — Progress Notes (Signed)
[name], [name] ([id number]) Visit Report for 01/28/2016 Chief Complaint Document Details Patient Name: [name], [name]. Date of Service: 01/28/2016 10:45 AM Medical Record Patient Account Number: [id number] Number: Treating RN: Clover Mealy, RN, BSN, Rita 1934-02-16 (82 y.o. Other Clinician: Date of Birth/Sex: Female) Treating Khylan Sawyer Primary Care Physician/Extender: Rex Kras, Heidi Physician: Referring Physician: Charolotte Capuchin in Treatment: 6 Information Obtained from: Patient Chief Complaint Patients presents for treatment of an open diabetic ulcer, arterial ulcer and pressure ulcers to the right lower extremities which is a complex etiology for the last several months. He also has ulcers on her abdomen in the left lower quadrant and left suprapubic area which she's had for about 4 months. 12/17/15; patient returns to clinic apparently referred back from vascular surgery for ongoing wound care. Electronic Signature(s) Signed: 01/29/2016 4:32:31 PM By: Baltazar Najjar MD Entered By: Baltazar Najjar on 01/28/2016 12:40:00 [name] ([id number]) -------------------------------------------------------------------------------- Debridement Details Patient Name: [name] Date of Service: 01/28/2016 10:45 AM Medical Record Patient Account Number: [id number] Number: Treating RN: Clover Mealy, RN, BSN, Rita 1933/10/21 (82 y.o. Other Clinician: Date of Birth/Sex: Female) Treating Clara Herbison Primary Care Physician/Extender: Rex Kras, Heidi Physician: Referring Physician: Charolotte Capuchin in Treatment: 6 Debridement Performed for Wound #9 Right Achilles Assessment: Performed By: Physician Maxwell Caul, MD Debridement: Debridement Pre-procedure Yes - 10:55 Verification/Time Out Taken: Start Time: 10:55 Pain Control: Lidocaine 4% Topical Solution Level: Skin/Subcutaneous Tissue Total Area Debrided (L x 5 (cm) x 5 (cm) = 25  (cm) W): Tissue and other Fibrin/Slough, Subcutaneous material debrided: Instrument: Blade, Forceps Bleeding: Moderate Hemostasis Achieved: Pressure End Time: 10:59 Procedural Pain: 0 Post Procedural Pain: 0 Response to Treatment: Procedure was tolerated well Post Debridement Measurements of Total Wound Length: (cm) 10.5 Width: (cm) 5 Depth: (cm) 0.3 Volume: (cm) 12.37 Character of Wound/Ulcer Post Requires Further Debridement Debridement: Severity of Tissue Post Debridement: Limited to breakdown of skin Post Procedure Diagnosis Same as Pre-procedure Electronic Signature(s) Signed: 01/28/2016 3:26:52 PM By: Elpidio Eric BSN, RN [name] ([id number]) Signed: 01/29/2016 4:32:31 PM By: Baltazar Najjar MD Entered By: Baltazar Najjar on 01/28/2016 12:39:34 [name] ([id number]) -------------------------------------------------------------------------------- HPI Details Patient Name: [name], [name]. Date of Service: 01/28/2016 10:45 AM Medical Record Patient Account Number: [id number] Number: Treating RN: Clover Mealy, RN, BSN, Rita [birth year] (82 y.o. Other Clinician: Date of Birth/Sex: Female) Treating Jazmin Ley Primary Care Physician/Extender: Rex Kras, Heidi Physician: Referring Physician: Charolotte Capuchin in Treatment: 6 History of Present Illness Location: several wounds on the right lower extremity including her right healed right posterior ankle and right lower third of the leg. She also has wounds on her left lower quadrant of the abdomen and the suprapubic area. Quality: Patient reports experiencing a sharp pain to affected area(s). Severity: Patient states wound are getting worse. Duration: Patient has had the wound for > 3 months prior to seeking treatment at the wound center Timing: Pain in wound is Intermittent (comes and goes Context: The wound appeared gradually over time Modifying Factors: Other treatment(s) tried  include:he simply been admitted to the hospital 2 weeks ago and has had procedures done on her right lower extremity and had a blockage which we are trying to get some notes Associated Signs and Symptoms: Patient reports having increase discharge. HPI Description: 82 year old patient who is known to be diabetic, was referred to [place] by Dr. Gavin Potters for a right heel ulceration which she's had for a while. She was recently in hospital  for a pneumonia and at that time and got delirious and was disoriented and sometime during this time developed a stage II ulcer on her right heel. Her past medical history is significant for bilateral pneumonia which was treated with injectable antibiotics and then to oral Levaquin which he has completed. She also has acute on chronic diastolic CHF, acute on chronic respiratory failure, end-stage renal disease on hemodialysis, atrial fibrillation, recent stroke, diabetes mellitus. The patient and her son are poor historians but from what I understand she was admitted to the hospital with an acute vascular compromise of her right lower extremity and Dr. Wyn Quakerew has done a surgical procedure and we are trying to obtain these notes. There are also some vascular workup done and we will try and obtain these notes. the injury to the left lower quadrant of abdomen and the suprapubic area have been there due to a bruise and have been there for several months and no intervention has been done. 10/11/2015 -- on review of the electronics records it was noted that the patient was admitted to the hospital on 09/14/2015 with peripheral vascular disease with claudication, end-stage renal disease, pressure ulcer, chronic atrial fibrillation. She was seen by Dr. Wyn Quakerew who did her right lower extremity angiogram , angioplasty of the right anterior tibial artery and thrombolysis with TPA of the right popliteal artery, and thrombectomy. She was seen by Dr. Wyn Quakerew during this past week and he was  pleased with the progress. He did say that if he took her to the operating room for any procedure he would debride the abdominal wound under anesthesia. She was also seen by Dr. Ether GriffinsFowler the podiatrist who thought that she may lose her right fourth toe at some stage may need an amputation of this. Natalie RimaHOPKINS, Vivien Y. ([id number]) 10/21/2015 --patient known to Dr. Wyn Quakerew and his last office visit from 10/04/2015 has been reviewed. 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 She also had a persistent ulceration of the right heel and markedly this area and a right third and fourth toe and a small scab on the calf but these are dry and seemed to be improving. Patient also has a left carotid endarterectomy and multiple interventions to a right brachiocephalic AV fistula. After the visit he had recommended noninvasive studies to recheck her revascularization. He was off the impression that she would likely lose the right fourth toe and the third toe was likely to heal. He was concerned about underlying muscle necrosis on her right heel and midfoot. 11/01/2015 -- an echo done in January of this year showed her left ventricular ejection fraction to be about 50-55%. The patient was seen by the PA and Dr. Driscilla Grammesdew's office and the plan was to take her to the operating room soon to have a debridement under anesthesia for the abdominal wall wound, the Achilles tendon on the right leg and amputation of the right fourth toe. The daughter and the patient do not feel that they would be able to undergo hyperbaric oxygen therapy 5 days a week for 6 weeks. 12/17/15; this is a medically complex woman who I note was recently in this clinic however I was not involved with her care. She returns today with multiple wounds; a) she has a wound in the mid abdomen that is been there since March of this year. I note  that she is been to the overall for debridement  recently. The exact etiology of this wound is not really clear b) left lower quadrant abdominal wound had some sanguinous drainage when she came in here. The patient fell in January and thinks this may have been secondary to a hematoma. c): The patient has 3 wounds on her right leg including a small wound on the right mid calf, a large area over the Achilles which currently has a wound VAC for the last 6 weeks, also a smaller wound on the distal part of the right heel. As far as I understand most of these wounds are currently been dressed with's calcium alginate. According to her daughter the Achilles wound under the wound VAC is doing well d) the patient is had an amputation of her left fifth toe in January and the right fourth toe 6 weeks ago secondary to diabetic PAD e) the patient has chronic renal failure on dialysis for the last 2 years secondary to type 2 diabetes on insulin. The daughter's knowledge there is been no biopsy of the abdominal wounds given their current appearance and lack of undefined etiology at have to wonder about calciphylaxis. 12/18/15:Addendum; I [facility] I can see no relevant x-rays of the right heel. I note her arteriogram and revascularization of her right lower extremity in April 2017. She had debridement of both abdominal wounds and the right heel and Achilles wound on 11/07/15 at which time she had a right fourth toe ray amputation. The abdominal wounds were debridement again on 6/29. I do not see any relevant pathology of these abdominal wounds 12/24/15; culture I did of the drainage from the midline abdominal wound last week showed both Proteus and ampicillin sensitive enterococcus. I've given her a course of Augmentin adjusted on dialysis days. She has no specific complaints today. Been using Santyl to the abdominal wounds in the right leg wound and the wound VAC on the right Achilles which was  initially prescribed by Dr. dew 12/31/15; I have done two punch biopsies of the large midline abdominal. 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 May have been a trauma component of the one on the left lower quadrant however the midline wound had no such history. She has a large area on the right Achilles heel with a wound VAC prescribed by Dr. dew. A small wound on the right anterior leg.Marland Kitchen UNFORTUNATELY she has 2 new wounds today. One on the left heel which is probably a pressure area. As well her previous amputation site of her right fourth toe has dehisced and now has a small wound with significant depth at the amputation site. [name], [name] ([id number]) 01/14/2016 -- she returns after 2 weeks and had had a punch biopsy of abdominal wound done the last visit -- had a biopsy of her midline abdominal wound done and the Pathology diagnosis is that of ulceration, necrosis and inflammation and negative for dysplasia and malignancy. 01/21/16. 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 In the meantime she has new wounds of the left heel the right fourth toe amputation site is opened up. The back is stopped to the right heel area. 01/28/16; the abdominal wounds continued to improve. The extensive wound on her right Achilles also looks stable except for the lower aspect of the wound where there is a large liquefied area that probes right down to her calcaneus. This cultured Proteus last week I have her on Augmentin and doxycycline 1. I think this is going to  need a course of IV antibiotics and I will call dialysis. X-ray I did last week was negative, I think she is going to need an MRI Electronic Signature(s) Signed: 01/29/2016 4:32:31 PM By: Baltazar Najjar MD Entered By: Baltazar Najjar on 01/28/2016 12:42:09 [name] ([id number]) -------------------------------------------------------------------------------- Physical Exam  Details Patient Name: [name] Date of Service: 01/28/2016 10:45 AM Medical Record Patient Account Number: [id number] Number: Treating RN: Clover Mealy, RN, BSN, Rita 03-11-1934 (82 y.o. Other Clinician: Date of Birth/Sex: Female) Treating Jacolyn Joaquin Primary Care Physician/Extender: Rex Kras, Heidi Physician: Referring Physician: Charolotte Capuchin in Treatment: 6 Notes Wound exam; midline abdominal wound in the left lower abdominal wound continued to looks stable to improved. The area on the left heel is covered by an eschar the right fourth amputation site is open but looks improved. The problematic area is the base of the extensive area over the right Achilles this as copious amounts of necrotic material removed and I'm convinced this goes straight down to the Achilles itself Electronic Signature(s) Signed: 01/29/2016 4:32:31 PM By: Baltazar Najjar MD Entered By: Baltazar Najjar on 01/28/2016 12:42:59 [name] ([id number]) -------------------------------------------------------------------------------- Physician Orders Details Patient Name: [name]. Date of Service: 01/28/2016 10:45 AM Medical Record Patient Account Number: [id number] Number: Treating RN: Clover Mealy, RN, BSN, Rita January 27, 1934 (82 y.o. Other Clinician: Date of Birth/Sex: Female) Treating Neima Lacross Primary Care Physician/Extender: Rex Kras, Heidi Physician: Referring Physician: Charolotte Capuchin in Treatment: 6 Verbal / Phone Orders: Yes Clinician: Afful, RN, BSN, Rita Read Back and Verified: Yes Diagnosis Coding Wound Cleansing Wound #10 Right Calcaneus o Clean wound with Normal Saline. Wound #11 Midline Abdomen - Lower Quadrant o Clean wound with Normal Saline. Wound #12 Left Abdomen - Lower Quadrant o Clean wound with Normal Saline. Wound #13 Left Calcaneus o Clean wound with Normal Saline. Wound #14 Right Amputation Site - Digit o Clean  wound with Normal Saline. Wound #9 Right Achilles o Clean wound with Normal Saline. Anesthetic Wound #10 Right Calcaneus o Topical Lidocaine 4% cream applied to wound bed prior to debridement - in Wound Clinic Wound #11 Midline Abdomen - Lower Quadrant o Topical Lidocaine 4% cream applied to wound bed prior to debridement - in Wound Clinic Wound #12 Left Abdomen - Lower Quadrant o Topical Lidocaine 4% cream applied to wound bed prior to debridement - in Wound Clinic Wound #13 Left Calcaneus o Topical Lidocaine 4% cream applied to wound bed prior to debridement - in Wound Clinic Wound #14 Right Amputation Site - Digit o Topical Lidocaine 4% cream applied to wound bed prior to debridement - in Wound Clinic XARA, PAULDING ([id number]) Wound #9 Right Achilles o Topical Lidocaine 4% cream applied to wound bed prior to debridement - in Wound Clinic Skin Barriers/Peri-Wound Care Wound #10 Right Calcaneus o Skin Prep Wound #11 Midline Abdomen - Lower Quadrant o Skin Prep Wound #12 Left Abdomen - Lower Quadrant o Skin Prep Wound #13 Left Calcaneus o Skin Prep Wound #14 Right Amputation Site - Digit o Skin Prep Wound #9 Right Achilles o Skin Prep Primary Wound Dressing Wound #10 Right Calcaneus o Aquacel Ag - HHRN to please order his for patient Wound #11 Midline Abdomen - Lower Quadrant o Aquacel Ag - HHRN to please order his for patient Wound #12 Left Abdomen - Lower Quadrant o Aquacel Ag - HHRN to please order his for patient Wound #13 Left Calcaneus o Aquacel Ag - HHRN to please order his for patient  Wound #14 Right Amputation Site - Digit o Aquacel Ag - HHRN to please order his for patient Wound #9 Right Achilles o Aquacel Ag - HHRN to please order his for patient Secondary Dressing Wound #10 Right Calcaneus o ABD and Kerlix/Conform o Dry Gauze - secure with tape Wound #11 Midline Abdomen - Lower [name], [name]  ([id number]) o ABD and Kerlix/Conform o Dry Gauze - secure with tape Wound #12 Left Abdomen - Lower Quadrant o ABD and Kerlix/Conform o Dry Gauze - secure with tape Wound #13 Left Calcaneus o ABD and Kerlix/Conform o Dry Gauze - secure with tape Wound #14 Right Amputation Site - Digit o ABD and Kerlix/Conform o Dry Gauze - secure with tape Wound #9 Right Achilles o ABD and Kerlix/Conform o Dry Gauze - secure with tape Dressing Change Frequency Wound #10 Right Calcaneus o Change Dressing Monday, Wednesday, Friday - or as needed Wound #11 Midline Abdomen - Lower Quadrant o Change Dressing Monday, Wednesday, Friday - or as needed Wound #12 Left Abdomen - Lower Quadrant o Change Dressing Monday, Wednesday, Friday - or as needed Wound #13 Left Calcaneus o Change Dressing Monday, Wednesday, Friday - or as needed Wound #14 Right Amputation Site - Digit o Change Dressing Monday, Wednesday, Friday - or as needed Wound #9 Right Achilles o Change Dressing Monday, Wednesday, Friday - or as needed Follow-up Appointments Wound #10 Right Calcaneus o Return Appointment in 1 week. Wound #11 Midline Abdomen - Lower Quadrant o Return Appointment in 1 week. Wound #12 Left Abdomen - Lower Quadrant o Return Appointment in 1 week. Wound #13 Left Calcaneus [name], [name] ([id number]) o Return Appointment in 1 week. Wound #14 Right Amputation Site - Digit o Return Appointment in 1 week. Wound #9 Right Achilles o Return Appointment in 1 week. Home Health Wound #10 Right Calcaneus o Continue Home Health Visits - [facility] to order appropriate wound care supplies for patient o Home Health Nurse may visit PRN to address patientos wound care needs. o FACE TO FACE ENCOUNTER: MEDICARE and MEDICAID PATIENTS: I certify that this patient is under my care and that I had a face-to-face encounter that meets the physician face-to-face encounter  requirements with this patient on this date. The encounter with the patient was in whole or in part for the following MEDICAL CONDITION: (primary reason for Home Healthcare) MEDICAL NECESSITY: I certify, that based on my findings, NURSING services are a medically necessary home health service. HOME BOUND STATUS: I certify that my clinical findings support that this patient is homebound (i.e., Due to illness or injury, pt requires aid of supportive devices such as crutches, cane, wheelchairs, walkers, the use of special transportation or the assistance of another person to leave their place of residence. There is a normal inability to leave the home and doing so requires considerable and taxing effort. Other absences are for medical reasons / religious services and are infrequent or of short duration when for other reasons). o If current dressing causes regression in wound condition, may D/C ordered dressing product/s and apply Normal Saline Moist Dressing daily until next Wound Healing Center / Other MD appointment. Notify Wound Healing Center of regression in wound condition at [phone]. o Please direct any NON-WOUND related issues/requests for orders to patient's Primary Care Physician Wound #11 Midline Abdomen - Lower Quadrant o Continue Home Health Visits - [facility] to order appropriate wound care supplies for patient o Home Health Nurse may visit PRN to address patientos  wound care needs. o FACE TO FACE ENCOUNTER: MEDICARE and MEDICAID PATIENTS: I certify that this patient is under my care and that I had a face-to-face encounter that meets the physician face-to-face encounter requirements with this patient on this date. The encounter with the patient was in whole or in part for the following MEDICAL CONDITION: (primary reason for Home Healthcare) MEDICAL NECESSITY: I certify, that based on my findings, NURSING services are a medically necessary home health service.  HOME BOUND STATUS: I certify that my clinical findings support that this patient is homebound (i.e., Due to illness or injury, pt requires aid of supportive devices such as crutches, cane, wheelchairs, walkers, the use of special transportation or the assistance of another person to leave their place of residence. There is a normal inability to leave the home and doing so requires considerable and taxing effort. Other absences are for medical reasons / religious services and are infrequent or of short duration when for other reasons). o If current dressing causes regression in wound condition, may D/C ordered dressing product/s and apply Normal Saline Moist Dressing daily until next Wound Healing Center / Other MD appointment. Notify Wound Healing Center of regression in wound condition at [phone]. [name], [name] ([id number]) o Please direct any NON-WOUND related issues/requests for orders to patient's Primary Care Physician Wound #12 Left Abdomen - Lower Quadrant o Continue [facility] Health Visits - [facility] to order appropriate wound care supplies for patient o Home Health Nurse may visit PRN to address patientos wound care needs. o FACE TO FACE ENCOUNTER: MEDICARE and MEDICAID PATIENTS: I certify that this patient is under my care and that I had a face-to-face encounter that meets the physician face-to-face encounter requirements with this patient on this date. The encounter with the patient was in whole or in part for the following MEDICAL CONDITION: (primary reason for Home Healthcare) MEDICAL NECESSITY: I certify, that based on my findings, NURSING services are a medically necessary home health service. HOME BOUND STATUS: I certify that my clinical findings support that this patient is homebound (i.e., Due to illness or injury, pt requires aid of supportive devices such as crutches, cane, wheelchairs, walkers, the use of special transportation or the assistance of  another person to leave their place of residence. There is a normal inability to leave the home and doing so requires considerable and taxing effort. Other absences are for medical reasons / religious services and are infrequent or of short duration when for other reasons). o If current dressing causes regression in wound condition, may D/C ordered dressing product/s and apply Normal Saline Moist Dressing daily until next Wound Healing Center / Other MD appointment. Notify Wound Healing Center of regression in wound condition at [phone]. o Please direct any NON-WOUND related issues/requests for orders to patient's Primary Care Physician Wound #13 Left Calcaneus o Continue [facility] Health Visits - [facility] to order appropriate wound care supplies for patient o Home Health Nurse may visit PRN to address patientos wound care needs. o FACE TO FACE ENCOUNTER: MEDICARE and MEDICAID PATIENTS: I certify that this patient is under my care and that I had a face-to-face encounter that meets the physician face-to-face encounter requirements with this patient on this date. The encounter with the patient was in whole or in part for the following MEDICAL CONDITION: (primary reason for Home Healthcare) MEDICAL NECESSITY: I certify, that based on my findings, NURSING services are a medically necessary home health service. HOME BOUND STATUS: I certify  that my clinical findings support that this patient is homebound (i.e., Due to illness or injury, pt requires aid of supportive devices such as crutches, cane, wheelchairs, walkers, the use of special transportation or the assistance of another person to leave their place of residence. There is a normal inability to leave the home and doing so requires considerable and taxing effort. Other absences are for medical reasons / religious services and are infrequent or of short duration when for other reasons). o If current dressing causes  regression in wound condition, may D/C ordered dressing product/s and apply Normal Saline Moist Dressing daily until next Wound Healing Center / Other MD appointment. Notify Wound Healing Center of regression in wound condition at [phone]. o Please direct any NON-WOUND related issues/requests for orders to patient's Primary Care Physician Wound #14 Right Amputation Site - Digit o Continue Home Health Visits - [facility] - [facility] to order appropriate wound care supplies for patient [name], [name] ([id number]) o Home Health Nurse may visit PRN to address patientos wound care needs. o FACE TO FACE ENCOUNTER: MEDICARE and MEDICAID PATIENTS: I certify that this patient is under my care and that I had a face-to-face encounter that meets the physician face-to-face encounter requirements with this patient on this date. The encounter with the patient was in whole or in part for the following MEDICAL CONDITION: (primary reason for Home Healthcare) MEDICAL NECESSITY: I certify, that based on my findings, NURSING services are a medically necessary home health service. HOME BOUND STATUS: I certify that my clinical findings support that this patient is homebound (i.e., Due to illness or injury, pt requires aid of supportive devices such as crutches, cane, wheelchairs, walkers, the use of special transportation or the assistance of another person to leave their place of residence. There is a normal inability to leave the home and doing so requires considerable and taxing effort. Other absences are for medical reasons / religious services and are infrequent or of short duration when for other reasons). o If current dressing causes regression in wound condition, may D/C ordered dressing product/s and apply Normal Saline Moist Dressing daily until next Wound Healing Center / Other MD appointment. Notify Wound Healing Center of regression in wound condition at [phone]. o Please direct any  NON-WOUND related issues/requests for orders to patient's Primary Care Physician Wound #9 Right Achilles o Continue Home Health Visits - [facility] to order appropriate wound care supplies for patient o Home Health Nurse may visit PRN to address patientos wound care needs. o FACE TO FACE ENCOUNTER: MEDICARE and MEDICAID PATIENTS: I certify that this patient is under my care and that I had a face-to-face encounter that meets the physician face-to-face encounter requirements with this patient on this date. The encounter with the patient was in whole or in part for the following MEDICAL CONDITION: (primary reason for Home Healthcare) MEDICAL NECESSITY: I certify, that based on my findings, NURSING services are a medically necessary home health service. HOME BOUND STATUS: I certify that my clinical findings support that this patient is homebound (i.e., Due to illness or injury, pt requires aid of supportive devices such as crutches, cane, wheelchairs, walkers, the use of special transportation or the assistance of another person to leave their place of residence. There is a normal inability to leave the home and doing so requires considerable and taxing effort. Other absences are for medical reasons / religious services and are infrequent or of short duration when for other reasons). o If current  dressing causes regression in wound condition, may D/C ordered dressing product/s and apply Normal Saline Moist Dressing daily until next Wound Healing Center / Other MD appointment. Notify Wound Healing Center of regression in wound condition at [phone]. o Please direct any NON-WOUND related issues/requests for orders to patient's Primary Care Physician Radiology o MRI, lower extremity with contast - right foot Electronic Signature(s) Signed: 01/28/2016 3:26:52 PM By: Elpidio Eric BSN, RN Signed: 01/29/2016 4:32:31 PM By: Baltazar Najjar MD [name] ([id number]) Entered  By: Elpidio Eric on 01/28/2016 11:00:58 [name] ([id number]) -------------------------------------------------------------------------------- Problem List Details Patient Name: [name], [name]. Date of Service: 01/28/2016 10:45 AM Medical Record Patient Account Number: [id number] Number: Treating RN: Clover Mealy, RN, BSN, Rita 07/30/1933 (82 y.o. Other Clinician: Date of Birth/Sex: Female) Treating Kahlyn Shippey Primary Care Physician/Extender: Rex Kras, Heidi Physician: Referring Physician: Charolotte Capuchin in Treatment: 6 Active Problems ICD-10 Encounter Code Description Active Date Diagnosis E11.621 Type 2 diabetes mellitus with foot ulcer 12/17/2015 Yes S31.104A Unspecified open wound of abdominal wall, left lower 12/17/2015 Yes quadrant without penetration into peritoneal cavity, initial encounter E11.51 Type 2 diabetes mellitus with diabetic peripheral 12/17/2015 Yes angiopathy without gangrene Inactive Problems Resolved Problems Electronic Signature(s) Signed: 01/29/2016 4:32:31 PM By: Baltazar Najjar MD Entered By: Baltazar Najjar on 01/28/2016 12:36:24 [name] ([id number]) -------------------------------------------------------------------------------- Progress Note Details Patient Name: [name] Date of Service: 01/28/2016 10:45 AM Medical Record Patient Account Number: [id number] Number: Treating RN: Clover Mealy, RN, BSN, Rita 16-Jun-1933 (82 y.o. Other Clinician: Date of Birth/Sex: Female) Treating Suzanne Garbers Primary Care Physician/Extender: Rex Kras, Heidi Physician: Referring Physician: Charolotte Capuchin in Treatment: 6 Subjective Chief Complaint Information obtained from Patient Patients presents for treatment of an open diabetic ulcer, arterial ulcer and pressure ulcers to the right lower extremities which is a complex etiology for the last several months. He also has ulcers on her abdomen in the left  lower quadrant and left suprapubic area which she's had for about 4 months. 12/17/15; patient returns to clinic apparently referred back from vascular surgery for ongoing wound care. History of Present Illness (HPI) The following HPI elements were documented for the patient's wound: Location: several wounds on the right lower extremity including her right healed right posterior ankle and right lower third of the leg. She also has wounds on her left lower quadrant of the abdomen and the suprapubic area. Quality: Patient reports experiencing a sharp pain to affected area(s). Severity: Patient states wound are getting worse. Duration: Patient has had the wound for > 3 months prior to seeking treatment at the wound center Timing: Pain in wound is Intermittent (comes and goes Context: The wound appeared gradually over time Modifying Factors: Other treatment(s) tried include:he simply been admitted to the hospital 2 weeks ago and has had procedures done on her right lower extremity and had a blockage which we are trying to get some notes Associated Signs and Symptoms: Patient reports having increase discharge. 82 year old patient who is known to be diabetic, was referred to [place] by Dr. Gavin Potters for a right heel ulceration which she's had for a while. She was recently in hospital for a pneumonia and at that time and got delirious and was disoriented and sometime during this time developed a stage II ulcer on her right heel. Her past medical history is significant for bilateral pneumonia which was treated with injectable antibiotics and then to oral Levaquin which he has completed. She also has acute on chronic diastolic CHF,  acute on chronic respiratory failure, end-stage renal disease on hemodialysis, atrial fibrillation, recent stroke, diabetes mellitus. The patient and her son are poor historians but from what I understand she was admitted to the hospital with an acute vascular compromise of her  right lower extremity and Dr. Wyn Quaker has done a surgical procedure and we are trying to obtain these notes. There are also some vascular workup done and we will try and obtain these notes. the injury to the left lower quadrant of abdomen and the suprapubic area have been there due to a bruise and have been there for several months and no intervention has been done. [name], [name] ([id number]) 10/11/2015 -- on review of the electronics records it was noted that the patient was admitted to the hospital on 09/14/2015 with peripheral vascular disease with claudication, end-stage renal disease, pressure ulcer, chronic atrial fibrillation. She was seen by Dr. Wyn Quaker who did her right lower extremity angiogram , angioplasty of the right anterior tibial artery and thrombolysis with TPA of the right popliteal artery, and thrombectomy. She was seen by Dr. Wyn Quaker during this past week and he was pleased with the progress. He did say that if he took her to the operating room for any procedure he would debride the abdominal wound under anesthesia. She was also seen by Dr. Ether Griffins the podiatrist who thought that she may lose her right fourth toe at some stage may need an amputation of this. 10/21/2015 --patient known to Dr. Wyn Quaker and his last office visit from 10/04/2015 has been reviewed. 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 She also had a persistent ulceration of the right heel and markedly this area and a right third and fourth toe and a small scab on the calf but these are dry and seemed to be improving. Patient also has a left carotid endarterectomy and multiple interventions to a right brachiocephalic AV fistula. After the visit he had recommended noninvasive studies to recheck her revascularization. He was off the impression that she would likely lose the right fourth toe and the third  toe was likely to heal. He was concerned about underlying muscle necrosis on her right heel and midfoot. 11/01/2015 -- an echo done in January of this year showed her left ventricular ejection fraction to be about 50-55%. The patient was seen by the PA and Dr. Driscilla Grammes office and the plan was to take her to the operating room soon to have a debridement under anesthesia for the abdominal wall wound, the Achilles tendon on the right leg and amputation of the right fourth toe. The daughter and the patient do not feel that they would be able to undergo hyperbaric oxygen therapy 5 days a week for 6 weeks. 12/17/15; this is a medically complex woman who I note was recently in this clinic however I was not involved with her care. She returns today with multiple wounds; a) she has a wound in the mid abdomen that is been there since March of this year. I note that she is been to the overall for debridement recently. The exact etiology of this wound is not really clear b) left lower quadrant abdominal wound had some sanguinous drainage when she came in here. The patient fell in January and thinks this may have been secondary to a hematoma. c): The patient has 3 wounds on her right leg including a small wound on the right mid  calf, a large area over the Achilles which currently has a wound VAC for the last 6 weeks, also a smaller wound on the distal part of the right heel. As far as I understand most of these wounds are currently been dressed with's calcium alginate. According to her daughter the Achilles wound under the wound VAC is doing well d) the patient is had an amputation of her left fifth toe in January and the right fourth toe 6 weeks ago secondary to diabetic PAD e) the patient has chronic renal failure on dialysis for the last 2 years secondary to type 2 diabetes on insulin. The daughter's knowledge there is been no biopsy of the abdominal wounds given their current appearance and lack of  undefined etiology at have to wonder about calciphylaxis. 12/18/15:Addendum; I [facility] I can see no relevant x-rays of the right heel. I note her arteriogram and revascularization of her right lower extremity in April 2017. She had debridement of both abdominal wounds and the right heel and Achilles wound on 11/07/15 at which time she had a right fourth toe ray amputation. The abdominal wounds were debridement again on 6/29. I do not see any [name], [name] ([id number]) relevant pathology of these abdominal wounds 12/24/15; culture I did of the drainage from the midline abdominal wound last week showed both Proteus and ampicillin sensitive enterococcus. I've given her a course of Augmentin adjusted on dialysis days. She has no specific complaints today. Been using Santyl to the abdominal wounds in the right leg wound and the wound VAC on the right Achilles which was initially prescribed by Dr. dew 12/31/15; I have done two punch biopsies of the large midline abdominal. 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 May have been a trauma component of the one on the left lower quadrant however the midline wound had no such history. She has a large area on the right Achilles heel with a wound VAC prescribed by Dr. dew. A small wound on the right anterior leg.Marland Kitchen UNFORTUNATELY she has 2 new wounds today. One on the left heel which is probably a pressure area. As well her previous amputation site of her right fourth toe has dehisced and now has a small wound with significant depth at the amputation site. 01/14/2016 -- she returns after 2 weeks and had had a punch biopsy of abdominal wound done the last visit -- had a biopsy of her midline abdominal wound done and the Pathology diagnosis is that of ulceration, necrosis and inflammation and negative for dysplasia and malignancy. 01/21/16. 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 In the meantime she has new wounds of the left heel the right fourth toe amputation site is opened up. The back is stopped to the right heel area. 01/28/16; the abdominal wounds continued to improve. The extensive wound on her right Achilles also looks stable except for the lower aspect of the wound where there is a large liquefied area that probes right down to her calcaneus. This cultured Proteus last week I have her on Augmentin and doxycycline 1. I think this is going to need a course of IV antibiotics and I will call dialysis. X-ray I did last week was negative, I think she is going to need an MRI Objective Constitutional Vitals Time Taken: 10:32 AM, Height: 65 in, Weight: 160 lbs, BMI: 26.6, Temperature: 97.6 F, Pulse: 77 bpm, Respiratory Rate: 18 breaths/min, Blood Pressure: 97/81 mmHg. Integumentary (Hair,  Skin) Wound #10 status is Open. Original cause of wound was Gradually Appeared. The wound is located on the Right Calcaneus. The wound measures 1cm length x 1cm width x 0.1cm depth; 0.785cm^2 area and 0.079cm^3 volume. The wound is limited to skin breakdown. There is no tunneling or undermining noted. There is a medium amount of serous drainage noted. The wound margin is flat and intact. There is medium (34-66%) pink granulation within the wound bed. There is a medium (34-66%) amount of necrotic tissue within the wound bed including Eschar and Adherent Slough. The periwound skin appearance exhibited: Moist, Erythema. The periwound skin appearance did not exhibit: Callus, Crepitus, Excoriation, Fluctuance, Friable, Induration, Localized Edema, Rash, Scarring, Dry/Scaly, Maceration, Atrophie Blanche, Cyanosis, Ecchymosis, Hemosiderin Staining, Mottled, Pallor, Rubor. The surrounding wound skin color is noted with erythema which is circumferential. Periwound temperature was noted as No Abnormality. The periwound has tenderness on palpation. Wound #11 status is Open.  Original cause of wound was Trauma. The wound is located on the Midline Abdomen - Lower Quadrant. The wound measures 5cm length x 4.5cm width x 0.8cm depth; 17.671cm^2 [name], BUSKE. ([id number]) area and 14.137cm^3 volume. The wound is limited to skin breakdown. There is no tunneling noted, however, there is undermining starting at 12:00 and ending at 12:00 with a maximum distance of 1.5cm. There is a large amount of serous drainage noted. The wound margin is flat and intact. There is large (67- 100%) red granulation within the wound bed. There is a small (1-33%) amount of necrotic tissue within the wound bed including Eschar and Adherent Slough. The periwound skin appearance exhibited: Moist, Erythema. The periwound skin appearance did not exhibit: Callus, Crepitus, Excoriation, Fluctuance, Friable, Induration, Localized Edema, Rash, Scarring, Dry/Scaly, Maceration, Atrophie Blanche, Cyanosis, Ecchymosis, Hemosiderin Staining, Mottled, Pallor, Rubor. The surrounding wound skin color is noted with erythema which is circumferential. Periwound temperature was noted as No Abnormality. The periwound has tenderness on palpation. Wound #12 status is Open. Original cause of wound was Trauma. The wound is located on the Left Abdomen - Lower Quadrant. The wound measures 1.5cm length x 8cm width x 1.1cm depth; 9.425cm^2 area and 10.367cm^3 volume. The wound is limited to skin breakdown. There is no tunneling or undermining noted. There is a large amount of serous drainage noted. The wound margin is flat and intact. There is large (67-100%) red granulation within the wound bed. There is a small (1-33%) amount of necrotic tissue within the wound bed including Adherent Slough. The periwound skin appearance exhibited: Moist, Erythema. The periwound skin appearance did not exhibit: Callus, Crepitus, Excoriation, Fluctuance, Friable, Induration, Localized Edema, Rash, Scarring, Dry/Scaly, Maceration, Atrophie  Blanche, Cyanosis, Ecchymosis, Hemosiderin Staining, Mottled, Pallor, Rubor. The surrounding wound skin color is noted with erythema which is circumferential. Periwound temperature was noted as No Abnormality. The periwound has tenderness on palpation. Wound #13 status is Open. Original cause of wound was Gradually Appeared. The wound is located on the Left Calcaneus. The wound measures 1cm length x 1cm width x 0.1cm depth; 0.785cm^2 area and 0.079cm^3 volume. The wound is limited to skin breakdown. There is no tunneling or undermining noted. There is a medium amount of serosanguineous drainage noted. The wound margin is flat and intact. There is no granulation within the wound bed. There is a large (67-100%) amount of necrotic tissue within the wound bed including Eschar and Adherent Slough. The periwound skin appearance exhibited: Moist. The periwound skin appearance did not exhibit: Callus, Crepitus, Excoriation, Fluctuance, Friable, Induration, Localized Edema,  Rash, Scarring, Dry/Scaly, Maceration, Atrophie Blanche, Cyanosis, Ecchymosis, Hemosiderin Staining, Mottled, Pallor, Rubor, Erythema. Periwound temperature was noted as No Abnormality. Wound #14 status is Open. Original cause of wound was Surgical Injury. The wound is located on the Right Amputation Site - Digit. The wound measures 1cm length x 1cm width x 0.1cm depth; 0.785cm^2 area and 0.079cm^3 volume. The wound is limited to skin breakdown. There is no tunneling or undermining noted. There is a medium amount of serous drainage noted. The wound margin is flat and intact. There is no granulation within the wound bed. There is a large (67-100%) amount of necrotic tissue within the wound bed including Eschar and Adherent Slough. The periwound skin appearance exhibited: Maceration, Moist. The periwound skin appearance did not exhibit: Callus, Crepitus, Excoriation, Fluctuance, Friable, Induration, Localized Edema, Rash, Scarring,  Dry/Scaly, Atrophie Blanche, Cyanosis, Ecchymosis, Hemosiderin Staining, Mottled, Pallor, Rubor, Erythema. Periwound temperature was noted as No Abnormality. Wound #8 status is Healed - Epithelialized. Original cause of wound was Gradually Appeared. The wound is located on the Right,Lateral Lower Leg. The wound measures 0cm length x 0cm width x 0cm depth; 0cm^2 area and 0cm^3 volume. The wound is limited to skin breakdown. There is no tunneling or undermining noted. There is a none present amount of drainage noted. The wound margin is flat and intact. There is no granulation within the wound bed. There is no necrotic tissue within the wound bed. The periwound skin appearance exhibited: Dry/Scaly, Erythema. The periwound skin appearance did not exhibit: BRALYNN, VELADOR ([id number]) Callus, Crepitus, Excoriation, Fluctuance, Friable, Induration, Localized Edema, Rash, Scarring, Maceration, Moist, Atrophie Blanche, Cyanosis, Ecchymosis, Hemosiderin Staining, Mottled, Pallor, Rubor. The surrounding wound skin color is noted with erythema which is circumferential. Periwound temperature was noted as No Abnormality. Wound #9 status is Open. Original cause of wound was Gradually Appeared. The wound is located on the Right Achilles. The wound measures 10.5cm length x 5cm width x 9.3cm depth; 41.233cm^2 area and 383.471cm^3 volume. The wound is limited to skin breakdown. There is no tunneling or undermining noted. There is a large amount of drainage noted. The wound margin is thickened. There is large (67-100%) red granulation within the wound bed. There is a small (1-33%) amount of necrotic tissue within the wound bed including Eschar and Adherent Slough. The periwound skin appearance exhibited: Maceration, Moist, Erythema. The periwound skin appearance did not exhibit: Callus, Crepitus, Excoriation, Fluctuance, Friable, Induration, Localized Edema, Rash, Scarring, Dry/Scaly, Atrophie Blanche, Cyanosis,  Ecchymosis, Hemosiderin Staining, Mottled, Pallor, Rubor. The surrounding wound skin color is noted with erythema which is circumferential. The periwound has tenderness on palpation. 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 There was a Skin/Subcutaneous Tissue Debridement ([id number]) debridement with total area of 25 sq cm performed by Maxwell Caul, MD. with the following instrument(s): Blade and Forceps including Fibrin/Slough and Subcutaneous after achieving pain control using Lidocaine 4% Topical Solution. A time out was conducted at 10:55, prior to the start of the procedure. A Moderate amount of bleeding was controlled with Pressure. The procedure was tolerated well with a pain level of 0 throughout and a pain level of 0 following the procedure. Post Debridement Measurements: 10.5cm length x 5cm width  x 0.3cm depth; 12.37cm^3 volume. Character of Wound/Ulcer Post Debridement requires further debridement. Severity of Tissue Post Debridement is: Limited to breakdown of skin. Post procedure Diagnosis Wound #9: Same as Pre-Procedure SARANYA, HARLIN ([id number]) Plan Wound Cleansing: Wound #10 Right Calcaneus: Clean wound with Normal Saline. Wound #11 Midline Abdomen - Lower Quadrant: Clean wound with Normal Saline. Wound #12 Left Abdomen - Lower Quadrant: Clean wound with Normal Saline. Wound #13 Left Calcaneus: Clean wound with Normal Saline. Wound #14 Right Amputation Site - Digit: Clean wound with Normal Saline. Wound #9 Right Achilles: Clean wound with Normal Saline. Anesthetic: Wound #10 Right  Calcaneus: Topical Lidocaine 4% cream applied to wound bed prior to debridement - in Wound Clinic Wound #11 Midline Abdomen - Lower Quadrant: Topical Lidocaine 4% cream applied to wound bed prior to debridement - in Wound Clinic Wound #12 Left Abdomen - Lower Quadrant: Topical Lidocaine 4% cream applied to wound bed prior to debridement - in Wound Clinic Wound #13 Left Calcaneus: Topical Lidocaine 4% cream applied to wound bed prior to debridement - in Wound Clinic Wound #14 Right Amputation Site - Digit: Topical Lidocaine 4% cream applied to wound bed prior to debridement - in Wound Clinic Wound #9 Right Achilles: Topical Lidocaine 4% cream applied to wound bed prior to debridement - in Wound Clinic Skin Barriers/Peri-Wound Care: Wound #10 Right Calcaneus: Skin Prep Wound #11 Midline Abdomen - Lower Quadrant: Skin Prep Wound #12 Left Abdomen - Lower Quadrant: Skin Prep Wound #13 Left Calcaneus: Skin Prep Wound #14 Right Amputation Site - Digit: Skin Prep Wound #9 Right Achilles: Skin Prep Primary Wound Dressing: Wound #10 Right Calcaneus: Aquacel Ag - HHRN to please order his for patient Wound #11 Midline Abdomen - Lower Quadrant: Aquacel Ag - HHRN to please order his for patient Wound #12 Left Abdomen - Lower QuadrantMAGDALENE, TARDIFF ([id number]) Aquacel Ag - HHRN to please order his for patient Wound #13 Left Calcaneus: Aquacel Ag - HHRN to please order his for patient Wound #14 Right Amputation Site - Digit: Aquacel Ag - HHRN to please order his for patient Wound #9 Right Achilles: [employer] - HHRN to please order his for patient Secondary Dressing: Wound #10 Right Calcaneus: ABD and Kerlix/Conform Dry Gauze - secure with tape Wound #11 Midline Abdomen - Lower Quadrant: ABD and Kerlix/Conform Dry Gauze - secure with tape Wound #12 Left Abdomen - Lower Quadrant: ABD and Kerlix/Conform Dry Gauze - secure with tape Wound #13 Left Calcaneus: ABD and  Kerlix/Conform Dry Gauze - secure with tape Wound #14 Right Amputation Site - Digit: ABD and Kerlix/Conform Dry Gauze - secure with tape Wound #9 Right Achilles: ABD and Kerlix/Conform Dry Gauze - secure with tape Dressing Change Frequency: Wound #10 Right Calcaneus: Change Dressing Monday, Wednesday, Friday - or as needed Wound #11 Midline Abdomen - Lower Quadrant: Change Dressing Monday, Wednesday, Friday - or as needed Wound #12 Left Abdomen - Lower Quadrant: Change Dressing Monday, Wednesday, Friday - or as needed Wound #13 Left Calcaneus: Change Dressing Monday, Wednesday, Friday - or as needed Wound #14 Right Amputation Site - Digit: Change Dressing Monday, Wednesday, Friday - or as needed Wound #9 Right Achilles: Change Dressing Monday, Wednesday, Friday - or as needed Follow-up Appointments: Wound #10 Right Calcaneus: Return Appointment in 1 week. Wound #11 Midline Abdomen - Lower Quadrant: Return Appointment in 1 week. Wound #12 Left Abdomen - Lower Quadrant: Return Appointment in 1 week. Wound #13 Left Calcaneus: Return Appointment in 1 week. Wound #  14 Right Amputation Site - Digit: Return Appointment in 1 week. Wound #9 Right Achilles: KARINNA, BEADLES ([id number]) Return Appointment in 1 week. Home Health: Wound #10 Right Calcaneus: Continue [facility] Visits - [facility] to order appropriate wound care supplies for patient Home Health Nurse may visit PRN to address patient s wound care needs. FACE TO FACE ENCOUNTER: MEDICARE and MEDICAID PATIENTS: I certify that this patient is under my care and that I had a face-to-face encounter that meets the physician face-to-face encounter requirements with this patient on this date. The encounter with the patient was in whole or in part for the following MEDICAL CONDITION: (primary reason for Home Healthcare) MEDICAL NECESSITY: I certify, that based on my findings, NURSING services are a medically necessary home  health service. HOME BOUND STATUS: I certify that my clinical findings support that this patient is homebound (i.e., Due to illness or injury, pt requires aid of supportive devices such as crutches, cane, wheelchairs, walkers, the use of special transportation or the assistance of another person to leave their place of residence. There is a normal inability to leave the home and doing so requires considerable and taxing effort. Other absences are for medical reasons / religious services and are infrequent or of short duration when for other reasons). If current dressing causes regression in wound condition, may D/C ordered dressing product/s and apply Normal Saline Moist Dressing daily until next Wound Healing Center / Other MD appointment. Notify Wound Healing Center of regression in wound condition at [phone]. Please direct any NON-WOUND related issues/requests for orders to patient's Primary Care Physician Wound #11 Midline Abdomen - Lower Quadrant: Continue [facility] Visits - [facility] to order appropriate wound care supplies for patient Home Health Nurse may visit PRN to address patient s wound care needs. FACE TO FACE ENCOUNTER: MEDICARE and MEDICAID PATIENTS: I certify that this patient is under my care and that I had a face-to-face encounter that meets the physician face-to-face encounter requirements with this patient on this date. The encounter with the patient was in whole or in part for the following MEDICAL CONDITION: (primary reason for Home Healthcare) MEDICAL NECESSITY: I certify, that based on my findings, NURSING services are a medically necessary home health service. HOME BOUND STATUS: I certify that my clinical findings support that this patient is homebound (i.e., Due to illness or injury, pt requires aid of supportive devices such as crutches, cane, wheelchairs, walkers, the use of special transportation or the assistance of another person to leave their place  of residence. There is a normal inability to leave the home and doing so requires considerable and taxing effort. Other absences are for medical reasons / religious services and are infrequent or of short duration when for other reasons). If current dressing causes regression in wound condition, may D/C ordered dressing product/s and apply Normal Saline Moist Dressing daily until next Wound Healing Center / Other MD appointment. Notify Wound Healing Center of regression in wound condition at [phone]. Please direct any NON-WOUND related issues/requests for orders to patient's Primary Care Physician Wound #12 Left Abdomen - Lower Quadrant: Continue [facility] Visits - [facility] to order appropriate wound care supplies for patient Home Health Nurse may visit PRN to address patient s wound care needs. FACE TO FACE ENCOUNTER: MEDICARE and MEDICAID PATIENTS: I certify that this patient is under my care and that I had a face-to-face encounter that meets the physician face-to-face encounter requirements with this patient on  this date. The encounter with the patient was in whole or in part for the following MEDICAL CONDITION: (primary reason for Home Healthcare) MEDICAL NECESSITY: I certify, that based on my findings, NURSING services are a medically necessary home health service. HOME BOUND STATUS: I certify that my clinical findings support that this patient is homebound (i.e., Due to illness or injury, pt requires aid of supportive devices such as crutches, cane, wheelchairs, walkers, the use of special transportation or the assistance of another person to leave their place of residence. There is a normal inability to leave the home and doing so requires considerable and taxing effort. Other absences are for medical reasons / religious services and are infrequent or of short duration when for other reasons). If current dressing causes regression in wound condition, may D/C ordered dressing  product/s and apply Normal Saline Moist Dressing daily until next Wound Healing Center / Other MD appointment. Notify Wound RITI, ROLLYSON ([id number]) Healing Center of regression in wound condition at [phone]. Please direct any NON-WOUND related issues/requests for orders to patient's Primary Care Physician Wound #13 Left Calcaneus: Continue [facility] Visits - [facility] to order appropriate wound care supplies for patient Home Health Nurse may visit PRN to address patient s wound care needs. FACE TO FACE ENCOUNTER: MEDICARE and MEDICAID PATIENTS: I certify that this patient is under my care and that I had a face-to-face encounter that meets the physician face-to-face encounter requirements with this patient on this date. The encounter with the patient was in whole or in part for the following MEDICAL CONDITION: (primary reason for Home Healthcare) MEDICAL NECESSITY: I certify, that based on my findings, NURSING services are a medically necessary home health service. HOME BOUND STATUS: I certify that my clinical findings support that this patient is homebound (i.e., Due to illness or injury, pt requires aid of supportive devices such as crutches, cane, wheelchairs, walkers, the use of special transportation or the assistance of another person to leave their place of residence. There is a normal inability to leave the home and doing so requires considerable and taxing effort. Other absences are for medical reasons / religious services and are infrequent or of short duration when for other reasons). If current dressing causes regression in wound condition, may D/C ordered dressing product/s and apply Normal Saline Moist Dressing daily until next Wound Healing Center / Other MD appointment. Notify Wound Healing Center of regression in wound condition at [phone]. Please direct any NON-WOUND related issues/requests for orders to patient's Primary Care Physician Wound #14 Right  Amputation Site - Digit: Continue [facility] - [facility] to order appropriate wound care supplies for patient Home Health Nurse may visit PRN to address patient s wound care needs. FACE TO FACE ENCOUNTER: MEDICARE and MEDICAID PATIENTS: I certify that this patient is under my care and that I had a face-to-face encounter that meets the physician face-to-face encounter requirements with this patient on this date. The encounter with the patient was in whole or in part for the following MEDICAL CONDITION: (primary reason for Home Healthcare) MEDICAL NECESSITY: I certify, that based on my findings, NURSING services are a medically necessary home health service. HOME BOUND STATUS: I certify that my clinical findings support that this patient is homebound (i.e., Due to illness or injury, pt requires aid of supportive devices such as crutches, cane, wheelchairs, walkers, the use of special transportation or the assistance of another person to leave their place of residence. There is  a normal inability to leave the home and doing so requires considerable and taxing effort. Other absences are for medical reasons / religious services and are infrequent or of short duration when for other reasons). If current dressing causes regression in wound condition, may D/C ordered dressing product/s and apply Normal Saline Moist Dressing daily until next Wound Healing Center / Other MD appointment. Notify Wound Healing Center of regression in wound condition at [phone]. Please direct any NON-WOUND related issues/requests for orders to patient's Primary Care Physician Wound #9 Right Achilles: Continue [facility] Visits - [facility] to order appropriate wound care supplies for patient Home Health Nurse may visit PRN to address patient s wound care needs. FACE TO FACE ENCOUNTER: MEDICARE and MEDICAID PATIENTS: I certify that this patient is under my care and that I had a face-to-face encounter  that meets the physician face-to-face encounter requirements with this patient on this date. The encounter with the patient was in whole or in part for the following MEDICAL CONDITION: (primary reason for Home Healthcare) MEDICAL NECESSITY: I certify, that based on my findings, NURSING services are a medically necessary home health service. HOME BOUND STATUS: I certify that my clinical findings support that this patient is homebound (i.e., Due to illness or injury, pt requires aid of supportive devices such as crutches, cane, wheelchairs, walkers, the use of special transportation or the assistance of another person to leave their place of residence. There is a normal inability to leave the home and doing so requires considerable and taxing effort. Other absences are for medical reasons / religious services and are infrequent or of short duration when for other reasons). If current dressing causes regression in wound condition, may D/C ordered dressing product/s and apply Normal Saline Moist Dressing daily until next Wound Healing Center / Other MD appointment. Notify Wound ADI, SEALES ([id number]) Healing Center of regression in wound condition at [phone]. Please direct any NON-WOUND related issues/requests for orders to patient's Primary Care Physician Radiology ordered were: MRI, lower extremity with contast - right foot MRI without contrast needs 2 weeks of IV antibiotics at dialysis assuming MRI is negative. No change to [employer] dressings Electronic Signature(s) Signed: 01/29/2016 4:32:31 PM By: Baltazar Najjar MD Entered By: Baltazar Najjar on 01/28/2016 12:44:39 Jakiera, Ehler Vinetta Bergamo ([id number]) -------------------------------------------------------------------------------- SuperBill Details Patient Name: [name] Date of Service: 01/28/2016 Medical Record Patient Account Number: [id number] Number: Treating RN: Clover Mealy, RN, BSN, [place] [birth year] [id number] y.o.  Other Clinician: Date of Birth/Sex: Female) Treating Jenaro Souder Primary Care Physician/Extender: Rex Kras, Heidi Physician: Weeks in Treatment: 6 Referring Physician: [name] Diagnosis Coding ICD-10 Codes Code Description E11.621 Type 2 diabetes mellitus with foot ulcer Unspecified open wound of abdominal wall, left lower quadrant without penetration into S31.104A peritoneal cavity, initial encounter E11.51 Type 2 diabetes mellitus with diabetic peripheral angiopathy without gangrene Facility Procedures CPT4 Code: [id number] Description: 11042 - DEB SUBQ TISSUE 20 SQ CM/< ICD-10 Description Diagnosis E11.621 Type 2 diabetes mellitus with foot ulcer Modifier: Quantity: 1 CPT4 Code: [id number] Description: 11045 - DEB SUBQ TISS EA ADDL 20CM ICD-10 Description Diagnosis E11.621 Type 2 diabetes mellitus with foot ulcer Modifier: Quantity: 1 Physician Procedures CPT4 Code: [id number] Description: 11042 - WC PHYS SUBQ TISS 20 SQ CM ICD-10 Description Diagnosis E11.621 Type 2 diabetes mellitus with foot ulcer Modifier: Quantity: 1 CPT4 Code: [id number] Bran, Myalee Description: 11045 - WC PHYS SUBQ TISS EA ADDL 20 CM ICD-10 Description Diagnosis E11.621 Type 2 diabetes mellitus with foot  ulcer Y. ([id number]) Modifier: Quantity: 1 Electronic Signature(s) Signed: 01/29/2016 4:32:31 PM By: Baltazar Najjar MD Entered By: Baltazar Najjar on 01/28/2016 12:45:05

## 2016-01-30 NOTE — Progress Notes (Signed)
MIKELL, CAMP ([id number]) Visit Report for 01/14/2016 Chief Complaint Document Details Patient Name: [name], [name]. Date of Service: 01/14/2016 9:15 AM Medical Record Number: [id number] Patient Account Number: [id number] Date of Birth/Sex: 1934/02/28 ([name]) Treating RN: Curtis Sites Primary Care Physician: [name] Other Clinician: Referring Physician: [name] Treating Physician/Extender: Rudene Re in Treatment: 4 Information Obtained from: Patient Chief Complaint Patients presents for treatment of an open diabetic ulcer, arterial ulcer and pressure ulcers to the right lower extremities which is a complex etiology for the last several months. He also has ulcers on her abdomen in the left lower quadrant and left suprapubic area which she's had for about 4 months. 12/17/15; patient returns to clinic apparently referred back from vascular surgery for ongoing wound care. Electronic Signature(s) Signed: 01/14/2016 10:49:15 AM By: Evlyn Kanner MD, FACS Entered By: Evlyn Kanner on 01/14/2016 10:49:14 [name] ([id number]) -------------------------------------------------------------------------------- Debridement Details Patient Name: [name] Date of Service: 01/14/2016 9:15 AM Medical Record Number: [id number] Patient Account Number: [id number] Date of Birth/Sex: 18-Apr-1934 ([name]) Treating RN: Curtis Sites Primary Care Physician: [name] Other Clinician: Referring Physician: [name] Treating Physician/Extender: Rudene Re in Treatment: 4 Debridement Performed for Wound #11 Midline Abdomen - Lower Quadrant Assessment: Performed By: Physician Evlyn Kanner, MD Debridement: Debridement Pre-procedure Yes Verification/Time Out Taken: Start Time: 10:02 Pain Control: Lidocaine 4% Topical Solution Level: Skin/Subcutaneous Tissue Total Area Debrided (L x 3 (cm) x 2 (cm) = 6 (cm) W): Tissue and  other Viable, Non-Viable, Eschar, Fibrin/Slough, Subcutaneous material debrided: Instrument: Curette Bleeding: Minimum Hemostasis Achieved: Pressure End Time: 10:05 Procedural Pain: 0 Post Procedural Pain: 0 Response to Treatment: Procedure was tolerated well Post Debridement Measurements of Total Wound Length: (cm) 3.8 Width: (cm) 7.2 Depth: (cm) 1.4 Volume: (cm) 30.084 Post Procedure Diagnosis Same as Pre-procedure Electronic Signature(s) Signed: 01/14/2016 10:47:25 AM By: Evlyn Kanner MD, FACS Signed: 01/14/2016 1:24:09 PM By: Curtis Sites Entered By: Evlyn Kanner on 01/14/2016 10:47:24 [name] ([id number]) -------------------------------------------------------------------------------- Debridement Details Patient Name: [name]. Date of Service: 01/14/2016 9:15 AM Medical Record Number: [id number] Patient Account Number: [id number] Date of Birth/Sex: May 16, 1934 ([name]) Treating RN: Curtis Sites Primary Care Physician: [name] Other Clinician: Referring Physician: [name] Treating Physician/Extender: Rudene Re in Treatment: 4 Debridement Performed for Wound #9 Right Achilles Assessment: Performed By: Physician Evlyn Kanner, MD Debridement: Debridement Pre-procedure Yes Verification/Time Out Taken: Start Time: 10:05 Pain Control: Lidocaine 4% Topical Solution Level: Skin/Subcutaneous Tissue Total Area Debrided (L x 4 (cm) x 5 (cm) = 20 (cm) W): Tissue and other Viable, Non-Viable, Eschar, Fibrin/Slough, Subcutaneous material debrided: Instrument: Forceps, Scissors Bleeding: Minimum Hemostasis Achieved: Pressure End Time: 10:10 Procedural Pain: 0 Post Procedural Pain: 0 Response to Treatment: Procedure was tolerated well Post Debridement Measurements of Total Wound Length: (cm) 9.2 Width: (cm) 5 Depth: (cm) 0.8 Volume: (cm) 28.903 Post Procedure Diagnosis Same as Pre-procedure Electronic  Signature(s) Signed: 01/14/2016 10:49:03 AM By: Evlyn Kanner MD, FACS Signed: 01/14/2016 1:24:09 PM By: Curtis Sites Entered By: Evlyn Kanner on 01/14/2016 10:49:02 [name] ([id number]) -------------------------------------------------------------------------------- HPI Details Patient Name: [name] Date of Service: 01/14/2016 9:15 AM Medical Record Patient Account Number: [id number] Number: Treating RN: Shontay, Wallner [birth year] (82 y.o. Other Clinician: Date of Birth/Sex: Female) Treating ROBSON, MICHAEL Primary Care Physician/Extender: Rex Kras, Heidi Physician: Referring Physician: Charolotte Capuchin in Treatment: 4 History of Present Illness Location: several wounds on the right lower extremity including  her right healed right posterior ankle and right lower third of the leg. She also has wounds on her left lower quadrant of the abdomen and the suprapubic area. Quality: Patient reports experiencing a sharp pain to affected area(s). Severity: Patient states wound are getting worse. Duration: Patient has had the wound for > 3 months prior to seeking treatment at the wound center Timing: Pain in wound is Intermittent (comes and goes Context: The wound appeared gradually over time Modifying Factors: Other treatment(s) tried include:he simply been admitted to the hospital 2 weeks ago and has had procedures done on her right lower extremity and had a blockage which we are trying to get some notes Associated Signs and Symptoms: Patient reports having increase discharge. HPI Description: 82 year old patient who is known to be diabetic, was referred to [place] by Dr. Gavin Potters for a right heel ulceration which she's had for a while. She was recently in hospital for a pneumonia and at that time and got delirious and was disoriented and sometime during this time developed a stage II ulcer on her right heel. Her past medical history is significant for bilateral  pneumonia which was treated with injectable antibiotics and then to oral Levaquin which he has completed. She also has acute on chronic diastolic CHF, acute on chronic respiratory failure, end-stage renal disease on hemodialysis, atrial fibrillation, recent stroke, diabetes mellitus. The patient and her son are poor historians but from what I understand she was admitted to the hospital with an acute vascular compromise of her right lower extremity and Dr. Wyn Quaker has done a surgical procedure and we are trying to obtain these notes. There are also some vascular workup done and we will try and obtain these notes. the injury to the left lower quadrant of abdomen and the suprapubic area have been there due to a bruise and have been there for several months and no intervention has been done. 10/11/2015 -- on review of the electronics records it was noted that the patient was admitted to the hospital on 09/14/2015 with peripheral vascular disease with claudication, end-stage renal disease, pressure ulcer, chronic atrial fibrillation. She was seen by Dr. Wyn Quaker who did her right lower extremity angiogram , angioplasty of the right anterior tibial artery and thrombolysis with TPA of the right popliteal artery, and thrombectomy. She was seen by Dr. Wyn Quaker during this past week and he was pleased with the progress. He did say that if he took her to the operating room for any procedure he would debride the abdominal wound under anesthesia. She was also seen by Dr. Ether Griffins the podiatrist who thought that she may lose her right fourth toe at some stage may need an amputation of this. [name], [name] ([id number]) 10/21/2015 --patient known to Dr. Wyn Quaker and his last office visit from 10/04/2015 has been reviewed. 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 She also had a persistent ulceration of the  right heel and markedly this area and a right third and fourth toe and a small scab on the calf but these are dry and seemed to be improving. Patient also has a left carotid endarterectomy and multiple interventions to a right brachiocephalic AV fistula. After the visit he had recommended noninvasive studies to recheck her revascularization. He was off the impression that she would likely lose the right fourth toe and the third toe was likely to heal. He was concerned about underlying  muscle necrosis on her right heel and midfoot. 11/01/2015 -- an echo done in January of this year showed her left ventricular ejection fraction to be about 50-55%. The patient was seen by the PA and Dr. Driscilla Grammes office and the plan was to take her to the operating room soon to have a debridement under anesthesia for the abdominal wall wound, the Achilles tendon on the right leg and amputation of the right fourth toe. The daughter and the patient do not feel that they would be able to undergo hyperbaric oxygen therapy 5 days a week for 6 weeks. 12/17/15; this is a medically complex woman who I note was recently in this clinic however I was not involved with her care. She returns today with multiple wounds; a) she has a wound in the mid abdomen that is been there since March of this year. I note that she is been to the overall for debridement recently. The exact etiology of this wound is not really clear b) left lower quadrant abdominal wound had some sanguinous drainage when she came in here. The patient fell in January and thinks this may have been secondary to a hematoma. c): The patient has 3 wounds on her right leg including a small wound on the right mid calf, a large area over the Achilles which currently has a wound VAC for the last 6 weeks, also a smaller wound on the distal part of the right heel. As far as I understand most of these wounds are currently been dressed with's calcium alginate. According to her  daughter the Achilles wound under the wound VAC is doing well d) the patient is had an amputation of her left fifth toe in January and the right fourth toe 6 weeks ago secondary to diabetic PAD e) the patient has chronic renal failure on dialysis for the last 2 years secondary to type 2 diabetes on insulin. The daughter's knowledge there is been no biopsy of the abdominal wounds given their current appearance and lack of undefined etiology at have to wonder about calciphylaxis. 12/18/15:Addendum; I [facility] I can see no relevant x-rays of the right heel. I note her arteriogram and revascularization of her right lower extremity in April 2017. She had debridement of both abdominal wounds and the right heel and Achilles wound on 11/07/15 at which time she had a right fourth toe ray amputation. The abdominal wounds were debridement again on 6/29. I do not see any relevant pathology of these abdominal wounds 12/24/15; culture I did of the drainage from the midline abdominal wound last week showed both Proteus and ampicillin sensitive enterococcus. I've given her a course of Augmentin adjusted on dialysis days. She has no specific complaints today. Been using Santyl to the abdominal wounds in the right leg wound and the wound VAC on the right Achilles which was initially prescribed by Dr. dew 12/31/15; I have done two punch biopsies of the large midline abdominal. 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 May have been a trauma component of the one on the left lower quadrant however the midline wound had no such history. She has a large area on the right Achilles heel with a wound VAC prescribed by Dr. dew. A small wound on the right anterior leg.Marland Kitchen UNFORTUNATELY she has 2 new wounds today. One on the left heel which is probably a pressure area. As well her previous amputation site of her right fourth toe has dehisced and now has a small wound with significant depth at the  amputation  site. [name], [name] ([id number]) 01/14/2016 -- she returns after 2 weeks and had had a punch biopsy of abdominal wound done the last visit -- had a biopsy of her midline abdominal wound done and the Pathology diagnosis is that of ulceration, necrosis and inflammation and negative for dysplasia and malignancy. Electronic Signature(s) Signed: 01/14/2016 10:49:29 AM By: Evlyn Kanner MD, FACS Signed: 01/29/2016 6:00:53 PM By: Baltazar Najjar MD Previous Signature: 01/14/2016 9:26:02 AM Version By: Evlyn Kanner MD, FACS Entered By: Evlyn Kanner on 01/14/2016 10:49:29 [name] ([id number]) -------------------------------------------------------------------------------- Physical Exam Details Patient Name: [name], [name]. Date of Service: 01/14/2016 9:15 AM Medical Record Number: [id number] Patient Account Number: [id number] Date of Birth/Sex: 01/08/1934 ([name]) Treating RN: Curtis Sites Primary Care Physician: [name] Other Clinician: Referring Physician: [name] Treating Physician/Extender: Rudene Re in Treatment: 4 Constitutional . Pulse regular. Respirations normal and unlabored. Afebrile. . Eyes Nonicteric. Reactive to light. Ears, Nose, Mouth, and Throat Lips, teeth, and gums WNL.Marland Kitchen Moist mucosa without lesions. Neck supple and nontender. No palpable supraclavicular or cervical adenopathy. Normal sized without goiter. Respiratory WNL. No retractions.. Cardiovascular Pedal Pulses WNL. No clubbing, cyanosis or edema. Lymphatic No adneopathy. No adenopathy. No adenopathy. Musculoskeletal Adexa without tenderness or enlargement.. Digits and nails w/o clubbing, cyanosis, infection, petechiae, ischemia, or inflammatory conditions.. Integumentary (Hair, Skin) No suspicious lesions. No crepitus or fluctuance. No peri-wound warmth or erythema. No masses.Marland Kitchen Psychiatric Judgement and insight Intact.. No evidence of depression, anxiety, or  agitation.. Notes the midline wound on the abdomen where biopsies were taken now fairly clean but there are 2 areas where there are pockets 1 approximately at the 11:00 position which has a lot of subcutaneous debris and has depth of approximately 1-1/2 cm. The area at about the 7:00 position also has some depth. These were sharply debrided with a curette. Right Achilles tendon has significant amount of necrosis and this again was sharply debrided with a forcep and scissors and there is some depth at the 6:00 position. She also has tendon at about the 12:00 position which were sharply debrided with forceps and scissors. Electronic Signature(s) Signed: 01/14/2016 10:50:47 AM By: Evlyn Kanner MD, FACS Entered By: Evlyn Kanner on 01/14/2016 10:50:46 [name] ([id number]) -------------------------------------------------------------------------------- Physician Orders Details Patient Name: [name] Date of Service: 01/14/2016 9:15 AM Medical Record Number: [id number] Patient Account Number: [id number] Date of Birth/Sex: 08/13/1933 ([name]) Treating RN: Curtis Sites Primary Care Physician: [name] Other Clinician: Referring Physician: [name] Treating Physician/Extender: Rudene Re in Treatment: 4 Verbal / Phone Orders: Yes Clinician: Curtis Sites Read Back and Verified: Yes Diagnosis Coding Wound Cleansing Wound #10 Right Calcaneus o Clean wound with Normal Saline. Wound #11 Midline Abdomen - Lower Quadrant o Clean wound with Normal Saline. Wound #12 Left Abdomen - Lower Quadrant o Clean wound with Normal Saline. Wound #13 Left Calcaneus o Clean wound with Normal Saline. Wound #14 Right Amputation Site - Digit o Clean wound with Normal Saline. Wound #8 Right,Lateral Lower Leg o Clean wound with Normal Saline. Wound #9 Right Achilles o Clean wound with Normal Saline. Anesthetic Wound #10 Right Calcaneus o  Topical Lidocaine 4% cream applied to wound bed prior to debridement - in Wound Clinic Wound #11 Midline Abdomen - Lower Quadrant o Topical Lidocaine 4% cream applied to wound bed prior to debridement - in Wound Clinic Wound #12 Left Abdomen - Lower Quadrant o Topical Lidocaine 4% cream applied to wound bed prior to  debridement - in Wound Clinic Wound #13 Left Calcaneus o Topical Lidocaine 4% cream applied to wound bed prior to debridement - in Wound Clinic Wound #14 Right Amputation Site - Digit o Topical Lidocaine 4% cream applied to wound bed prior to debridement - in Wound Clinic [name], [name] ([id number]) Wound #8 Right,Lateral Lower Leg o Topical Lidocaine 4% cream applied to wound bed prior to debridement - in Wound Clinic Wound #9 Right Achilles o Topical Lidocaine 4% cream applied to wound bed prior to debridement - in Wound Clinic Skin Barriers/Peri-Wound Care Wound #10 Right Calcaneus o Skin Prep Wound #11 Midline Abdomen - Lower Quadrant o Skin Prep Wound #12 Left Abdomen - Lower Quadrant o Skin Prep Wound #13 Left Calcaneus o Skin Prep Wound #14 Right Amputation Site - Digit o Skin Prep Wound #8 Right,Lateral Lower Leg o Skin Prep Wound #9 Right Achilles o Skin Prep Primary Wound Dressing Wound #10 Right Calcaneus o Aquacel Ag - HHRN to please order his for patient Wound #11 Midline Abdomen - Lower Quadrant o Aquacel Ag - HHRN to please order his for patient Wound #12 Left Abdomen - Lower Quadrant o Aquacel Ag - HHRN to please order his for patient Wound #8 Right,Lateral Lower Leg o Aquacel Ag - HHRN to please order his for patient Wound #9 Right Achilles o Aquacel Ag - HHRN to please order his for patient Wound #13 Left Calcaneus o Other: - betadine paint Wound #14 Right Amputation Site - Digit [name], [name] ([id number]) o Other: - betadine paint Secondary Dressing Wound #10 Right Calcaneus o ABD and  Kerlix/Conform o Dry Gauze - secure with tape Wound #11 Midline Abdomen - Lower Quadrant o ABD and Kerlix/Conform o Dry Gauze - secure with tape Wound #12 Left Abdomen - Lower Quadrant o ABD and Kerlix/Conform o Dry Gauze - secure with tape Wound #8 Right,Lateral Lower Leg o ABD and Kerlix/Conform o Dry Gauze - secure with tape Wound #9 Right Achilles o ABD and Kerlix/Conform o Dry Gauze - secure with tape Wound #14 Right Amputation Site - Digit o Dry Gauze - secure with tape Dressing Change Frequency Wound #10 Right Calcaneus o Change Dressing Monday, Wednesday, Friday - or as needed Wound #11 Midline Abdomen - Lower Quadrant o Change Dressing Monday, Wednesday, Friday - or as needed Wound #12 Left Abdomen - Lower Quadrant o Change Dressing Monday, Wednesday, Friday - or as needed Wound #13 Left Calcaneus o Change Dressing Monday, Wednesday, Friday - or as needed Wound #14 Right Amputation Site - Digit o Change Dressing Monday, Wednesday, Friday - or as needed Wound #8 Right,Lateral Lower Leg o Change Dressing Monday, Wednesday, Friday - or as needed Wound #9 Right Achilles o Change Dressing Monday, Wednesday, Friday - or as needed [name], [name] ([id number]) Follow-up Appointments Wound #10 Right Calcaneus o Return Appointment in 1 week. Wound #11 Midline Abdomen - Lower Quadrant o Return Appointment in 1 week. Wound #12 Left Abdomen - Lower Quadrant o Return Appointment in 1 week. Wound #13 Left Calcaneus o Return Appointment in 1 week. Wound #14 Right Amputation Site - Digit o Return Appointment in 1 week. Wound #8 Right,Lateral Lower Leg o Return Appointment in 1 week. Wound #9 Right Achilles o Return Appointment in 1 week. Home Health Wound #10 Right Calcaneus o Continue Home Health Visits - [facility] to order appropriate wound care supplies for patient o Home Health Nurse may visit [facility] to address  patientos wound care needs. o FACE  TO FACE ENCOUNTER: MEDICARE and MEDICAID PATIENTS: I certify that this patient is under my care and that I had a face-to-face encounter that meets the physician face-to-face encounter requirements with this patient on this date. The encounter with the patient was in whole or in part for the following MEDICAL CONDITION: (primary reason for Home Healthcare) MEDICAL NECESSITY: I certify, that based on my findings, NURSING services are a medically necessary home health service. HOME BOUND STATUS: I certify that my clinical findings support that this patient is homebound (i.e., Due to illness or injury, pt requires aid of supportive devices such as crutches, cane, wheelchairs, walkers, the use of special transportation or the assistance of another person to leave their place of residence. There is a normal inability to leave the home and doing so requires considerable and taxing effort. Other absences are for medical reasons / religious services and are infrequent or of short duration when for other reasons). o If current dressing causes regression in wound condition, may D/C ordered dressing product/s and apply Normal Saline Moist Dressing daily until next Wound Healing Center / Other MD appointment. Notify Wound Healing Center of regression in wound condition at [phone]. o Please direct any NON-WOUND related issues/requests for orders to patient's Primary Care Physician Wound #11 Midline Abdomen - Lower Quadrant o Continue Home Health Visits - [facility] to order appropriate wound care supplies for patient o Home Health Nurse may visit [facility] to address patientos wound care needs. [name], [name] ([id number]) o FACE TO FACE ENCOUNTER: MEDICARE and MEDICAID PATIENTS: I certify that this patient is under my care and that I had a face-to-face encounter that meets the physician face-to-face encounter requirements with this patient on this date.  The encounter with the patient was in whole or in part for the following MEDICAL CONDITION: (primary reason for Home Healthcare) MEDICAL NECESSITY: I certify, that based on my findings, NURSING services are a medically necessary home health service. HOME BOUND STATUS: I certify that my clinical findings support that this patient is homebound (i.e., Due to illness or injury, pt requires aid of supportive devices such as crutches, cane, wheelchairs, walkers, the use of special transportation or the assistance of another person to leave their place of residence. There is a normal inability to leave the home and doing so requires considerable and taxing effort. Other absences are for medical reasons / religious services and are infrequent or of short duration when for other reasons). o If current dressing causes regression in wound condition, may D/C ordered dressing product/s and apply Normal Saline Moist Dressing daily until next Wound Healing Center / Other MD appointment. Notify Wound Healing Center of regression in wound condition at [phone]. o Please direct any NON-WOUND related issues/requests for orders to patient's Primary Care Physician Wound #12 Left Abdomen - Lower Quadrant o Continue Home Health Visits - [facility] - [facility] to order appropriate wound care supplies for patient o Home Health Nurse may visit [facility]N to address patientos wound care needs. o FACE TO FACE ENCOUNTER: MEDICARE and MEDICAID PATIENTS: I certify that this patient is under my care and that I had a face-to-face encounter that meets the physician face-to-face encounter requirements with this patient on this date. The encounter with the patient was in whole or in part for the following MEDICAL CONDITION: (primary reason for Home Healthcare) MEDICAL NECESSITY: I certify, that based on my findings, NURSING services are a medically necessary home health service. HOME BOUND STATUS: I certify that my clinical  findings support that this patient is homebound (i.e., Due to illness or injury, pt requires aid of supportive devices such as crutches, cane, wheelchairs, walkers, the use of special transportation or the assistance of another person to leave their place of residence. There is a normal inability to leave the home and doing so requires considerable and taxing effort. Other absences are for medical reasons / religious services and are infrequent or of short duration when for other reasons). o If current dressing causes regression in wound condition, may D/C ordered dressing product/s and apply Normal Saline Moist Dressing daily until next Wound Healing Center / Other MD appointment. Notify Wound Healing Center of regression in wound condition at [phone]. o Please direct any NON-WOUND related issues/requests for orders to patient's Primary Care Physician Wound #13 Left Calcaneus o Continue [facility] Visits - [facility] to order appropriate wound care supplies for patient o Home Health Nurse may visit PRN to address patientos wound care needs. o FACE TO FACE ENCOUNTER: MEDICARE and MEDICAID PATIENTS: I certify that this patient is under my care and that I had a face-to-face encounter that meets the physician face-to-face encounter requirements with this patient on this date. The encounter with the patient was in whole or in part for the following MEDICAL CONDITION: (primary reason for Home Healthcare) MEDICAL NECESSITY: I certify, that based on my findings, NURSING services are a medically necessary home health service. HOME BOUND STATUS: I certify that my clinical findings [name], [name] ([id number]) support that this patient is homebound (i.e., Due to illness or injury, pt requires aid of supportive devices such as crutches, cane, wheelchairs, walkers, the use of special transportation or the assistance of another person to leave their place of residence. There is  a normal inability to leave the home and doing so requires considerable and taxing effort. Other absences are for medical reasons / religious services and are infrequent or of short duration when for other reasons). o If current dressing causes regression in wound condition, may D/C ordered dressing product/s and apply Normal Saline Moist Dressing daily until next Wound Healing Center / Other MD appointment. Notify Wound Healing Center of regression in wound condition at [phone]. o Please direct any NON-WOUND related issues/requests for orders to patient's Primary Care Physician Wound #14 Right Amputation Site - Digit o Continue [facility] Visits - [facility] to order appropriate wound care supplies for patient o Home Health Nurse may visit PRN to address patientos wound care needs. o FACE TO FACE ENCOUNTER: MEDICARE and MEDICAID PATIENTS: I certify that this patient is under my care and that I had a face-to-face encounter that meets the physician face-to-face encounter requirements with this patient on this date. The encounter with the patient was in whole or in part for the following MEDICAL CONDITION: (primary reason for Home Healthcare) MEDICAL NECESSITY: I certify, that based on my findings, NURSING services are a medically necessary home health service. HOME BOUND STATUS: I certify that my clinical findings support that this patient is homebound (i.e., Due to illness or injury, pt requires aid of supportive devices such as crutches, cane, wheelchairs, walkers, the use of special transportation or the assistance of another person to leave their place of residence. There is a normal inability to leave the home and doing so requires considerable and taxing effort. Other absences are for medical reasons / religious services and are infrequent or of short duration when for other reasons). o If current dressing causes regression in  wound condition, may D/C ordered  dressing product/s and apply Normal Saline Moist Dressing daily until next Wound Healing Center / Other MD appointment. Notify Wound Healing Center of regression in wound condition at [phone]. o Please direct any NON-WOUND related issues/requests for orders to patient's Primary Care Physician Wound #8 Right,Lateral Lower Leg o Continue [facility] Visits - [facility] to order appropriate wound care supplies for patient o Home Health Nurse may visit PRN to address patientos wound care needs. o FACE TO FACE ENCOUNTER: MEDICARE and MEDICAID PATIENTS: I certify that this patient is under my care and that I had a face-to-face encounter that meets the physician face-to-face encounter requirements with this patient on this date. The encounter with the patient was in whole or in part for the following MEDICAL CONDITION: (primary reason for Home Healthcare) MEDICAL NECESSITY: I certify, that based on my findings, NURSING services are a medically necessary home health service. HOME BOUND STATUS: I certify that my clinical findings support that this patient is homebound (i.e., Due to illness or injury, pt requires aid of supportive devices such as crutches, cane, wheelchairs, walkers, the use of special transportation or the assistance of another person to leave their place of residence. There is a normal inability to leave the home and doing so requires considerable and taxing effort. Other absences are for medical reasons / religious services and are infrequent or of short duration when for other reasons). [name], [name] ([id number]) o If current dressing causes regression in wound condition, may D/C ordered dressing product/s and apply Normal Saline Moist Dressing daily until next Wound Healing Center / Other MD appointment. Notify Wound Healing Center of regression in wound condition at [phone]. o Please direct any NON-WOUND related issues/requests for orders to  patient's Primary Care Physician Wound #9 Right Achilles o Continue [facility] Health Visits - [facility] to order appropriate wound care supplies for patient o Home Health Nurse may visit PRN to address patientos wound care needs. o FACE TO FACE ENCOUNTER: MEDICARE and MEDICAID PATIENTS: I certify that this patient is under my care and that I had a face-to-face encounter that meets the physician face-to-face encounter requirements with this patient on this date. The encounter with the patient was in whole or in part for the following MEDICAL CONDITION: (primary reason for Home Healthcare) MEDICAL NECESSITY: I certify, that based on my findings, NURSING services are a medically necessary home health service. HOME BOUND STATUS: I certify that my clinical findings support that this patient is homebound (i.e., Due to illness or injury, pt requires aid of supportive devices such as crutches, cane, wheelchairs, walkers, the use of special transportation or the assistance of another person to leave their place of residence. There is a normal inability to leave the home and doing so requires considerable and taxing effort. Other absences are for medical reasons / religious services and are infrequent or of short duration when for other reasons). o If current dressing causes regression in wound condition, may D/C ordered dressing product/s and apply Normal Saline Moist Dressing daily until next Wound Healing Center / Other MD appointment. Notify Wound Healing Center of regression in wound condition at [phone]. o Please direct any NON-WOUND related issues/requests for orders to patient's Primary Care Physician Negative Pressure Wound Therapy Wound #9 Right Achilles o Discontinue NPWT. Electronic Signature(s) Signed: 01/14/2016 12:12:38 PM By: Evlyn Kanner MD, FACS Signed: 01/14/2016 1:24:09 PM By: Curtis Sites Entered By: Curtis Sites on 01/14/2016 10:18:19 Hutzler, Vinetta Bergamo.  (  [id number]) -------------------------------------------------------------------------------- Problem List Details Patient Name: DIASIA, HENKEN. Date of Service: 01/14/2016 9:15 AM Medical Record Number: [id number] Patient Account Number: [id number] Date of Birth/Sex: [birth year] ([name]) Treating RN: Curtis Sites Primary Care Physician: [name] Other Clinician: Referring Physician: [name] Treating Physician/Extender: Rudene Re in Treatment: 4 Active Problems ICD-10 Encounter Code Description Active Date Diagnosis E11.621 Type 2 diabetes mellitus with foot ulcer 12/17/2015 Yes S31.104A Unspecified open wound of abdominal wall, left lower 12/17/2015 Yes quadrant without penetration into peritoneal cavity, initial encounter E11.51 Type 2 diabetes mellitus with diabetic peripheral 12/17/2015 Yes angiopathy without gangrene Inactive Problems Resolved Problems Electronic Signature(s) Signed: 01/14/2016 10:47:00 AM By: Evlyn Kanner MD, FACS Entered By: Evlyn Kanner on 01/14/2016 10:47:00 [name] ([id number]) -------------------------------------------------------------------------------- Progress Note Details Patient Name: [name] Date of Service: 01/14/2016 9:15 AM Medical Record Number: [id number] Patient Account Number: [id number] Date of Birth/Sex: 1934/03/11 ([name]) Treating RN: Curtis Sites Primary Care Physician: [name] Other Clinician: Referring Physician: [name] Treating Physician/Extender: Rudene Re in Treatment: 4 Subjective Chief Complaint Information obtained from Patient Patients presents for treatment of an open diabetic ulcer, arterial ulcer and pressure ulcers to the right lower extremities which is a complex etiology for the last several months. He also has ulcers on her abdomen in the left lower quadrant and left suprapubic area which she's had for about 4 months. 12/17/15;  patient returns to clinic apparently referred back from vascular surgery for ongoing wound care. History of Present Illness (HPI) The following HPI elements were documented for the patient's wound: Location: several wounds on the right lower extremity including her right healed right posterior ankle and right lower third of the leg. She also has wounds on her left lower quadrant of the abdomen and the suprapubic area. Quality: Patient reports experiencing a sharp pain to affected area(s). Severity: Patient states wound are getting worse. Duration: Patient has had the wound for > 3 months prior to seeking treatment at the wound center Timing: Pain in wound is Intermittent (comes and goes Context: The wound appeared gradually over time Modifying Factors: Other treatment(s) tried include:he simply been admitted to the hospital 2 weeks ago and has had procedures done on her right lower extremity and had a blockage which we are trying to get some notes Associated Signs and Symptoms: Patient reports having increase discharge. 82 year old patient who is known to be diabetic, was referred to [place] by Dr. Gavin Potters for a right heel ulceration which she's had for a while. She was recently in hospital for a pneumonia and at that time and got delirious and was disoriented and sometime during this time developed a stage II ulcer on her right heel. Her past medical history is significant for bilateral pneumonia which was treated with injectable antibiotics and then to oral Levaquin which he has completed. She also has acute on chronic diastolic CHF, acute on chronic respiratory failure, end-stage renal disease on hemodialysis, atrial fibrillation, recent stroke, diabetes mellitus. The patient and her son are poor historians but from what I understand she was admitted to the hospital with an acute vascular compromise of her right lower extremity and Dr. Wyn Quaker has done a surgical procedure and we are trying to  obtain these notes. There are also some vascular workup done and we will try and obtain these notes. the injury to the left lower quadrant of abdomen and the suprapubic area have been there due to a bruise and have been there  for several months and no intervention has been done. 10/11/2015 -- on review of the electronics records it was noted that the patient was admitted to the hospital on 09/14/2015 with peripheral vascular disease with claudication, end-stage renal disease, pressure ulcer, JOVONNA, NICKELL. ([id number]) chronic atrial fibrillation. She was seen by Dr. Wyn Quaker who did her right lower extremity angiogram , angioplasty of the right anterior tibial artery and thrombolysis with TPA of the right popliteal artery, and thrombectomy. She was seen by Dr. Wyn Quaker during this past week and he was pleased with the progress. He did say that if he took her to the operating room for any procedure he would debride the abdominal wound under anesthesia. She was also seen by Dr. Ether Griffins the podiatrist who thought that she may lose her right fourth toe at some stage may need an amputation of this. 10/21/2015 --patient known to Dr. Wyn Quaker and his last office visit from 10/04/2015 has been reviewed. 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 She also had a persistent ulceration of the right heel and markedly this area and a right third and fourth toe and a small scab on the calf but these are dry and seemed to be improving. Patient also has a left carotid endarterectomy and multiple interventions to a right brachiocephalic AV fistula. After the visit he had recommended noninvasive studies to recheck her revascularization. He was off the impression that she would likely lose the right fourth toe and the third toe was likely to heal. He was concerned about underlying muscle necrosis on her right  heel and midfoot. 11/01/2015 -- an echo done in January of this year showed her left ventricular ejection fraction to be about 50-55%. The patient was seen by the PA and Dr. Driscilla Grammes office and the plan was to take her to the operating room soon to have a debridement under anesthesia for the abdominal wall wound, the Achilles tendon on the right leg and amputation of the right fourth toe. The daughter and the patient do not feel that they would be able to undergo hyperbaric oxygen therapy 5 days a week for 6 weeks. 12/17/15; this is a medically complex woman who I note was recently in this clinic however I was not involved with her care. She returns today with multiple wounds; a) she has a wound in the mid abdomen that is been there since March of this year. I note that she is been to the overall for debridement recently. The exact etiology of this wound is not really clear b) left lower quadrant abdominal wound had some sanguinous drainage when she came in here. The patient fell in January and thinks this may have been secondary to a hematoma. c): The patient has 3 wounds on her right leg including a small wound on the right mid calf, a large area over the Achilles which currently has a wound VAC for the last 6 weeks, also a smaller wound on the distal part of the right heel. As far as I understand most of these wounds are currently been dressed with's calcium alginate. According to her daughter the Achilles wound under the wound VAC is doing well d) the patient is had an amputation of her left fifth toe in January and the right fourth toe 6 weeks ago secondary to diabetic PAD e) the patient has chronic renal failure on dialysis for the last 2 years secondary  to type 2 diabetes on insulin. The daughter's knowledge there is been no biopsy of the abdominal wounds given their current appearance and lack of undefined etiology at have to wonder about calciphylaxis. 12/18/15:Addendum; I have [facility] I can see no relevant x-rays of the right heel. I note her arteriogram and revascularization of her right lower extremity in April 2017. She had debridement of both abdominal wounds and the right heel and Achilles wound on 11/07/15 at which time she had a right fourth toe ray amputation. The abdominal wounds were debridement again on 6/29. I do not see any relevant pathology of these abdominal wounds 12/24/15; culture I did of the drainage from the midline abdominal wound last week showed both Proteus and ampicillin sensitive enterococcus. I've given her a course of Augmentin adjusted on dialysis days. She has [name], [name] ([id number]) no specific complaints today. Been using Santyl to the abdominal wounds in the right leg wound and the wound VAC on the right Achilles which was initially prescribed by Dr. dew 12/31/15; I have done two punch biopsies of the large midline abdominal. 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 May have been a trauma component of the one on the left lower quadrant however the midline wound had no such history. She has a large area on the right Achilles heel with a wound VAC prescribed by Dr. dew. A small wound on the right anterior leg.Marland Kitchen UNFORTUNATELY she has 2 new wounds today. One on the left heel which is probably a pressure area. As well her previous amputation site of her right fourth toe has dehisced and now has a small wound with significant depth at the amputation site. 01/14/2016 -- she returns after 2 weeks and had had a punch biopsy of abdominal wound done the last visit -- had a biopsy of her midline abdominal wound done and the Pathology diagnosis is that of ulceration, necrosis and inflammation and negative for dysplasia and malignancy. Objective Constitutional Pulse regular. Respirations normal and unlabored. Afebrile. Vitals Time Taken: 9:26 AM, Height: 65 in, Weight: 160 lbs, BMI: 26.6, Temperature: 98.4 F, Pulse: 75 bpm, Respiratory Rate:  18 breaths/min, Blood Pressure: 110/53 mmHg. Eyes Nonicteric. Reactive to light. Ears, Nose, Mouth, and Throat Lips, teeth, and gums WNL.Marland Kitchen Moist mucosa without lesions. Neck supple and nontender. No palpable supraclavicular or cervical adenopathy. Normal sized without goiter. Respiratory WNL. No retractions.. Cardiovascular Pedal Pulses WNL. No clubbing, cyanosis or edema. Lymphatic No adneopathy. No adenopathy. No adenopathy. Musculoskeletal Adexa without tenderness or enlargement.. Digits and nails w/o clubbing, cyanosis, infection, petechiae, ischemia, or inflammatory conditions.Marland Kitchen Psychiatric Judgement and insight Intact.. No evidence of depression, anxiety, or agitation.Marland Kitchen [name], [place] ([id number]) General Notes: the midline wound on the abdomen where biopsies were taken now fairly clean but there are 2 areas where there are pockets 1 approximately at the 11:00 position which has a lot of subcutaneous debris and has depth of approximately 1-1/2 cm. The area at about the 7:00 position also has some depth. These were sharply debrided with a curette. Right Achilles tendon has significant amount of necrosis and this again was sharply debrided with a forcep and scissors and there is some depth at the 6:00 position. She also has tendon at about the 12:00 position which were sharply debrided with forceps and scissors. Integumentary (Hair, Skin) No suspicious lesions. No crepitus or fluctuance. No peri-wound warmth or erythema. No masses.. Wound #10 status is Open. Original cause of wound was Gradually Appeared. The wound is located on the  Right Calcaneus. The wound measures 1.4cm length x 1.6cm width x 0.1cm depth; 1.759cm^2 area and 0.176cm^3 volume. The wound is limited to skin breakdown. There is no tunneling or undermining noted. There is a medium amount of serous drainage noted. The wound margin is flat and intact. There is large (67-100%) pink granulation within the wound bed.  There is a small (1-33%) amount of necrotic tissue within the wound bed including Eschar and Adherent Slough. The periwound skin appearance exhibited: Maceration, Moist, Erythema. The periwound skin appearance did not exhibit: Callus, Crepitus, Excoriation, Fluctuance, Friable, Induration, Localized Edema, Rash, Scarring, Dry/Scaly, Atrophie Blanche, Cyanosis, Ecchymosis, Hemosiderin Staining, Mottled, Pallor, Rubor. The surrounding wound skin color is noted with erythema which is circumferential. Periwound temperature was noted as No Abnormality. The periwound has tenderness on palpation. Wound #11 status is Open. Original cause of wound was Trauma. The wound is located on the Midline Abdomen - Lower Quadrant. The wound measures 3.8cm length x 7.2cm width x 1.4cm depth; 21.488cm^2 area and 30.084cm^3 volume. The wound is limited to skin breakdown. There is no tunneling or undermining noted. There is a large amount of serous drainage noted. The wound margin is flat and intact. There is large (67-100%) red granulation within the wound bed. There is a small (1-33%) amount of necrotic tissue within the wound bed including Eschar and Adherent Slough. The periwound skin appearance exhibited: Moist, Erythema. The periwound skin appearance did not exhibit: Callus, Crepitus, Excoriation, Fluctuance, Friable, Induration, Localized Edema, Rash, Scarring, Dry/Scaly, Maceration, Atrophie Blanche, Cyanosis, Ecchymosis, Hemosiderin Staining, Mottled, Pallor, Rubor. The surrounding wound skin color is noted with erythema which is circumferential. Periwound temperature was noted as No Abnormality. The periwound has tenderness on palpation. Wound #12 status is Open. Original cause of wound was Trauma. The wound is located on the Left Abdomen - Lower Quadrant. The wound measures 1.6cm length x 9.6cm width x 1.4cm depth; 12.064cm^2 area and 16.889cm^3 volume. The wound is limited to skin breakdown. There is no  tunneling or undermining noted. There is a large amount of serous drainage noted. The wound margin is flat and intact. There is large (67-100%) red granulation within the wound bed. There is a small (1-33%) amount of necrotic tissue within the wound bed including Eschar and Adherent Slough. The periwound skin appearance exhibited: Moist, Erythema. The periwound skin appearance did not exhibit: Callus, Crepitus, Excoriation, Fluctuance, Friable, Induration, Localized Edema, Rash, Scarring, Dry/Scaly, Maceration, Atrophie Blanche, Cyanosis, Ecchymosis, Hemosiderin Staining, Mottled, Pallor, Rubor. The surrounding wound skin color is noted with erythema which is circumferential. Periwound temperature was noted as No Abnormality. The periwound has tenderness on palpation. Wound #13 status is Open. Original cause of wound was Gradually Appeared. The wound is located on the Left Calcaneus. The wound measures 1cm length x 0.5cm width x 0.3cm depth; 0.393cm^2 area and 0.118cm^3 volume. The wound is limited to skin breakdown. There is no tunneling or undermining noted. SHIRLEAN, BERMAN ([id number]) There is a large amount of serosanguineous drainage noted. The wound margin is flat and intact. There is no granulation within the wound bed. There is a large (67-100%) amount of necrotic tissue within the wound bed including Eschar and Adherent Slough. The periwound skin appearance exhibited: Moist. The periwound skin appearance did not exhibit: Callus, Crepitus, Excoriation, Fluctuance, Friable, Induration, Localized Edema, Rash, Scarring, Dry/Scaly, Maceration, Atrophie Blanche, Cyanosis, Ecchymosis, Hemosiderin Staining, Mottled, Pallor, Rubor, Erythema. Periwound temperature was noted as No Abnormality. Wound #14 status is Open. Original cause of wound was Surgical Injury.  The wound is located on the Right Amputation Site - Digit. The wound measures 0.8cm length x 0.3cm width x 0.1cm depth; 0.188cm^2  area and 0.019cm^3 volume. The wound is limited to skin breakdown. There is no tunneling or undermining noted. There is a medium amount of serous drainage noted. The wound margin is flat and intact. There is no granulation within the wound bed. There is a large (67-100%) amount of necrotic tissue within the wound bed including Eschar and Adherent Slough. The periwound skin appearance exhibited: Maceration, Moist. The periwound skin appearance did not exhibit: Callus, Crepitus, Excoriation, Fluctuance, Friable, Induration, Localized Edema, Rash, Scarring, Dry/Scaly, Atrophie Blanche, Cyanosis, Ecchymosis, Hemosiderin Staining, Mottled, Pallor, Rubor, Erythema. Periwound temperature was noted as No Abnormality. Wound #8 status is Open. Original cause of wound was Gradually Appeared. The wound is located on the Right,Lateral Lower Leg. The wound measures 0.5cm length x 0.4cm width x 0.1cm depth; 0.157cm^2 area and 0.016cm^3 volume. The wound is limited to skin breakdown. There is no tunneling or undermining noted. There is a medium amount of serous drainage noted. The wound margin is flat and intact. There is medium (34-66%) red, pink granulation within the wound bed. There is a medium (34-66%) amount of necrotic tissue within the wound bed including Eschar and Adherent Slough. The periwound skin appearance exhibited: Maceration, Moist, Erythema. The periwound skin appearance did not exhibit: Callus, Crepitus, Excoriation, Fluctuance, Friable, Induration, Localized Edema, Rash, Scarring, Dry/Scaly, Atrophie Blanche, Cyanosis, Ecchymosis, Hemosiderin Staining, Mottled, Pallor, Rubor. The surrounding wound skin color is noted with erythema which is circumferential. Periwound temperature was noted as No Abnormality. Wound #9 status is Open. Original cause of wound was Gradually Appeared. The wound is located on the Right Achilles. The wound measures 9.2cm length x 5cm width x 0.3cm depth; 36.128cm^2  area and 10.838cm^3 volume. The wound is limited to skin breakdown. There is no tunneling or undermining noted. The wound margin is flat and intact. There is large (67-100%) red granulation within the wound bed. There is a small (1-33%) amount of necrotic tissue within the wound bed including Eschar and Adherent Slough. The periwound skin appearance exhibited: Maceration, Moist, Erythema. The periwound skin appearance did not exhibit: Callus, Crepitus, Excoriation, Fluctuance, Friable, Induration, Localized Edema, Rash, Scarring, Dry/Scaly, Atrophie Blanche, Cyanosis, Ecchymosis, Hemosiderin Staining, Mottled, Pallor, Rubor. The surrounding wound skin color is noted with erythema which is circumferential. The periwound has tenderness on palpation. Assessment Active Problems ICD-10 MEE, MACDONNELL ([id number]) E11.621 - Type 2 diabetes mellitus with foot ulcer S31.104A - Unspecified open wound of abdominal wall, left lower quadrant without penetration into peritoneal cavity, initial encounter E11.51 - Type 2 diabetes mellitus with diabetic peripheral angiopathy without gangrene After reviewing her wounds today I have recommended: 1. silver alginate packing for both the abdomen wounds. Theone of the left lateral has some oozing of blood and silver alginate will help this. 2. The right posterior ankle where the Achilles tendon has been excised today will need packing with silver alginate and I have discontinued the wound VAC and asked them to return this. 3. the heel wounds are dry including the one at the base of the right fourth toe amputation site and we will obtain these with Betadine. 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 There was a Skin/Subcutaneous Tissue Debridement (40981-19147) debridement with total area of 6 sq cm performed by Evlyn Kanner, MD. with the following instrument(s): Curette to remove  Viable and Non-Viable tissue/material including Fibrin/Slough, Eschar, and Subcutaneous after achieving pain control using Lidocaine 4% Topical Solution. A time out was conducted prior to the start of the procedure. A Minimum amount of bleeding was controlled with Pressure. The procedure was tolerated well with a pain level of 0 throughout and a pain level of 0 following the procedure. Post Debridement Measurements: 3.8cm length x 7.2cm width x 1.4cm depth; 30.084cm^3 volume. 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 There was a Skin/Subcutaneous Tissue Debridement (11914-78295) debridement with total area of 20 sq cm performed by Evlyn Kanner, MD. with the following instrument(s): Forceps and Scissors to remove Viable and Non-Viable tissue/material including Fibrin/Slough, Eschar, and Subcutaneous after achieving pain control using Lidocaine 4% Topical Solution. A time out was conducted prior to the start of the procedure. A Minimum amount of bleeding was controlled with Pressure. The procedure was tolerated well with a pain level of 0 throughout and a pain level of 0 following the procedure. Post Debridement Measurements: 9.2cm length x 5cm width x 0.8cm depth; 28.903cm^3 volume. Post procedure Diagnosis Wound #9: Same as Pre-Procedure DIVINE, IMBER ([id number]) Plan Wound Cleansing: Wound #10 Right Calcaneus: Clean wound with Normal Saline. Wound #11 Midline Abdomen - Lower Quadrant: Clean wound with Normal Saline. Wound #12 Left Abdomen - Lower Quadrant: Clean wound with Normal Saline. Wound #13 Left Calcaneus: Clean wound with Normal Saline. Wound #14 Right Amputation Site - Digit: Clean wound with Normal Saline. Wound #8 Right,Lateral Lower Leg: Clean wound with Normal Saline. Wound #9 Right Achilles: Clean wound with Normal Saline. Anesthetic: Wound #10 Right  Calcaneus: Topical Lidocaine 4% cream applied to wound bed prior to debridement - in Wound Clinic Wound #11 Midline Abdomen - Lower Quadrant: Topical Lidocaine 4% cream applied to wound bed prior to debridement - in Wound Clinic Wound #12 Left Abdomen - Lower Quadrant: Topical Lidocaine 4% cream applied to wound bed prior to debridement - in Wound Clinic Wound #13 Left Calcaneus: Topical Lidocaine 4% cream applied to wound bed prior to debridement - in Wound Clinic Wound #14 Right Amputation Site - Digit: Topical Lidocaine 4% cream applied to wound bed prior to debridement - in Wound Clinic Wound #8 Right,Lateral Lower Leg: Topical Lidocaine 4% cream applied to wound bed prior to debridement - in Wound Clinic Wound #9 Right Achilles: Topical Lidocaine 4% cream applied to wound bed prior to debridement - in Wound Clinic Skin Barriers/Peri-Wound Care: Wound #10 Right Calcaneus: Skin Prep Wound #11 Midline Abdomen - Lower Quadrant: Skin Prep Wound #12 Left Abdomen - Lower Quadrant: Skin Prep Wound #13 Left Calcaneus: Skin Prep Wound #14 Right Amputation Site - Digit: Skin Prep Wound #8 Right,Lateral Lower Leg: Skin Prep Wound #9 Right Achilles: Skin Prep LORRA, FREEMAN ([id number]) Primary Wound Dressing: Wound #10 Right Calcaneus: Aquacel Ag - HHRN to please order his for patient Wound #11 Midline Abdomen - Lower Quadrant: Aquacel Ag - HHRN to please order his for patient Wound #12 Left Abdomen - Lower Quadrant: Aquacel Ag - HHRN to please order his for patient Wound #8 Right,Lateral Lower Leg: Aquacel Ag - HHRN to please order his for patient Wound #9 Right Achilles: Aquacel Ag - HHRN to please order his for patient Wound #13 Left Calcaneus: Other: - betadine paint Wound #14 Right Amputation Site - Digit: Other: - betadine paint Secondary Dressing: Wound #10 Right Calcaneus: ABD and Kerlix/Conform Dry Gauze -  secure with tape Wound #11 Midline Abdomen - Lower  Quadrant: ABD and Kerlix/Conform Dry Gauze - secure with tape Wound #12 Left Abdomen - Lower Quadrant: ABD and Kerlix/Conform Dry Gauze - secure with tape Wound #8 Right,Lateral Lower Leg: ABD and Kerlix/Conform Dry Gauze - secure with tape Wound #9 Right Achilles: ABD and Kerlix/Conform Dry Gauze - secure with tape Wound #14 Right Amputation Site - Digit: Dry Gauze - secure with tape Dressing Change Frequency: Wound #10 Right Calcaneus: Change Dressing Monday, Wednesday, Friday - or as needed Wound #11 Midline Abdomen - Lower Quadrant: Change Dressing Monday, Wednesday, Friday - or as needed Wound #12 Left Abdomen - Lower Quadrant: Change Dressing Monday, Wednesday, Friday - or as needed Wound #13 Left Calcaneus: Change Dressing Monday, Wednesday, Friday - or as needed Wound #14 Right Amputation Site - Digit: Change Dressing Monday, Wednesday, Friday - or as needed Wound #8 Right,Lateral Lower Leg: Change Dressing Monday, Wednesday, Friday - or as needed Wound #9 Right Achilles: Change Dressing Monday, Wednesday, Friday - or as needed Follow-up Appointments: Wound #10 Right Calcaneus: Return Appointment in 1 week. HARRIETT, AZAR ([id number]) Wound #11 Midline Abdomen - Lower Quadrant: Return Appointment in 1 week. Wound #12 Left Abdomen - Lower Quadrant: Return Appointment in 1 week. Wound #13 Left Calcaneus: Return Appointment in 1 week. Wound #14 Right Amputation Site - Digit: Return Appointment in 1 week. Wound #8 Right,Lateral Lower Leg: Return Appointment in 1 week. Wound #9 Right Achilles: Return Appointment in 1 week. Home Health: Wound #10 Right Calcaneus: [facility] to order appropriate wound care supplies for patient Home Health Nurse may visit PRN to address patient s wound care needs. FACE TO FACE ENCOUNTER: MEDICARE and MEDICAID PATIENTS: I certify that this patient is under my care and that I had a face-to-face  encounter that meets the physician face-to-face encounter requirements with this patient on this date. The encounter with the patient was in whole or in part for the following MEDICAL CONDITION: (primary reason for Home Healthcare) MEDICAL NECESSITY: I certify, that based on my findings, NURSING services are a medically necessary home health service. HOME BOUND STATUS: I certify that my clinical findings support that this patient is homebound (i.e., Due to illness or injury, pt requires aid of supportive devices such as crutches, cane, wheelchairs, walkers, the use of special transportation or the assistance of another person to leave their place of residence. There is a normal inability to leave the home and doing so requires considerable and taxing effort. Other absences are for medical reasons / religious services and are infrequent or of short duration when for other reasons). If current dressing causes regression in wound condition, may D/C ordered dressing product/s and apply Normal Saline Moist Dressing daily until next Wound Healing Center / Other MD appointment. Notify Wound Healing Center of regression in wound condition at [phone]. Please direct any NON-WOUND related issues/requests for orders to patient's Primary Care Physician Wound #11 Midline Abdomen - Lower Quadrant: Continue [facility] Visits - [facility] to order appropriate wound care supplies for patient Home Health Nurse may visit PRN to address patient s wound care needs. FACE TO FACE ENCOUNTER: MEDICARE and MEDICAID PATIENTS: I certify that this patient is under my care and that I had a face-to-face encounter that meets the physician face-to-face encounter requirements with this patient on this date. The encounter with the patient was in whole or in part for the following MEDICAL CONDITION: (  primary reason for Home Healthcare) MEDICAL NECESSITY: I certify, that based on my findings, NURSING services are a  medically necessary home health service. HOME BOUND STATUS: I certify that my clinical findings support that this patient is homebound (i.e., Due to illness or injury, pt requires aid of supportive devices such as crutches, cane, wheelchairs, walkers, the use of special transportation or the assistance of another person to leave their place of residence. There is a normal inability to leave the home and doing so requires considerable and taxing effort. Other absences are for medical reasons / religious services and are infrequent or of short duration when for other reasons). If current dressing causes regression in wound condition, may D/C ordered dressing product/s and apply Normal Saline Moist Dressing daily until next Wound Healing Center / Other MD appointment. Notify Wound Healing Center of regression in wound condition at [phone]. Please direct any NON-WOUND related issues/requests for orders to patient's Primary Care Physician Wound #12 Left Abdomen - Lower Quadrant: Continue [facility] Visits - [facility] to order appropriate wound care supplies for patient Home Health Nurse may visit PRN to address patient s wound care needs. FACE TO FACE ENCOUNTER: MEDICARE and MEDICAID PATIENTS: I certify that this patient is under MACK, THURMON ([id number]) my care and that I had a face-to-face encounter that meets the physician face-to-face encounter requirements with this patient on this date. The encounter with the patient was in whole or in part for the following MEDICAL CONDITION: (primary reason for Home Healthcare) MEDICAL NECESSITY: I certify, that based on my findings, NURSING services are a medically necessary home health service. HOME BOUND STATUS: I certify that my clinical findings support that this patient is homebound (i.e., Due to illness or injury, pt requires aid of supportive devices such as crutches, cane, wheelchairs, walkers, the use of special transportation or  the assistance of another person to leave their place of residence. There is a normal inability to leave the home and doing so requires considerable and taxing effort. Other absences are for medical reasons / religious services and are infrequent or of short duration when for other reasons). If current dressing causes regression in wound condition, may D/C ordered dressing product/s and apply Normal Saline Moist Dressing daily until next Wound Healing Center / Other MD appointment. Notify Wound Healing Center of regression in wound condition at [phone]. Please direct any NON-WOUND related issues/requests for orders to patient's Primary Care Physician Wound #13 Left Calcaneus: Continue [facility] Visits - [facility] to order appropriate wound care supplies for patient Home Health Nurse may visit PRN to address patient s wound care needs. FACE TO FACE ENCOUNTER: MEDICARE and MEDICAID PATIENTS: I certify that this patient is under my care and that I had a face-to-face encounter that meets the physician face-to-face encounter requirements with this patient on this date. The encounter with the patient was in whole or in part for the following MEDICAL CONDITION: (primary reason for Home Healthcare) MEDICAL NECESSITY: I certify, that based on my findings, NURSING services are a medically necessary home health service. HOME BOUND STATUS: I certify that my clinical findings support that this patient is homebound (i.e., Due to illness or injury, pt requires aid of supportive devices such as crutches, cane, wheelchairs, walkers, the use of special transportation or the assistance of another person to leave their place of residence. There is a normal inability to leave the home and doing so requires considerable and taxing effort. Other absences are  for medical reasons / religious services and are infrequent or of short duration when for other reasons). If current dressing causes regression in  wound condition, may D/C ordered dressing product/s and apply Normal Saline Moist Dressing daily until next Wound Healing Center / Other MD appointment. Notify Wound Healing Center of regression in wound condition at [phone]. Please direct any NON-WOUND related issues/requests for orders to patient's Primary Care Physician Wound #14 Right Amputation Site - Digit: [facility] for [facility] Home Health Nurse may visit PRN to address patient s wound care needs. FACE TO FACE ENCOUNTER: MEDICARE and MEDICAID PATIENTS: I certify that this patient is under my care and that I had a face-to-face encounter that meets the physician face-to-face encounter requirements with this patient on this date. The encounter with the patient was in whole or in part for the following MEDICAL CONDITION: (primary reason for Home Healthcare) MEDICAL NECESSITY: I certify, that based on my findings, NURSING services are a medically necessary home health service. HOME BOUND STATUS: I certify that my clinical findings support that this patient is homebound (i.e., Due to illness or injury, pt requires aid of supportive devices such as crutches, cane, wheelchairs, walkers, the use of special transportation or the assistance of another person to leave their place of residence. There is a normal inability to leave the home and doing so requires considerable and taxing effort. Other absences are for medical reasons / religious services and are infrequent or of short duration when for other reasons). If current dressing causes regression in wound condition, may D/C ordered dressing product/s and apply Normal Saline Moist Dressing daily until next Wound Healing Center / Other MD appointment. Notify Wound Healing Center of regression in wound condition at [phone]. Please direct any NON-WOUND related issues/requests for orders to patient's Primary Care  Physician Wound #8 Right,Lateral Lower Leg: [facility] for patient Home Health Nurse may visit PRN to address patient s wound care needs. FACE TO FACE ENCOUNTER: MEDICARE and MEDICAID PATIENTS: I certify that this patient is under Natalie RimaHOPKINS, Terrianne Y. ([id number]) my care and that I had a face-to-face encounter that meets the physician face-to-face encounter requirements with this patient on this date. The encounter with the patient was in whole or in part for the following MEDICAL CONDITION: (primary reason for Home Healthcare) MEDICAL NECESSITY: I certify, that based on my findings, NURSING services are a medically necessary home health service. HOME BOUND STATUS: I certify that my clinical findings support that this patient is homebound (i.e., Due to illness or injury, pt requires aid of supportive devices such as crutches, cane, wheelchairs, walkers, the use of special transportation or the assistance of another person to leave their place of residence. There is a normal inability to leave the home and doing so requires considerable and taxing effort. Other absences are for medical reasons / religious services and are infrequent or of short duration when for other reasons). If current dressing causes regression in wound condition, may D/C ordered dressing product/s and apply Normal Saline Moist Dressing daily until next Wound Healing Center / Other MD appointment. Notify Wound Healing Center of regression in wound condition at [phone]. Please direct any NON-WOUND related issues/requests for orders to patient's Primary Care Physician Wound #9 Right Achilles: [facility] for patient Home Health  Nurse may visit PRN to address patient s wound care needs. FACE TO FACE ENCOUNTER: MEDICARE and MEDICAID PATIENTS: I certify that this patient is  under my care and that I had a face-to-face encounter that meets the physician face-to-face encounter requirements with this patient on this date. The encounter with the patient was in whole or in part for the following MEDICAL CONDITION: (primary reason for Home Healthcare) MEDICAL NECESSITY: I certify, that based on my findings, NURSING services are a medically necessary home health service. HOME BOUND STATUS: I certify that my clinical findings support that this patient is homebound (i.e., Due to illness or injury, pt requires aid of supportive devices such as crutches, cane, wheelchairs, walkers, the use of special transportation or the assistance of another person to leave their place of residence. There is a normal inability to leave the home and doing so requires considerable and taxing effort. Other absences are for medical reasons / religious services and are infrequent or of short duration when for other reasons). If current dressing causes regression in wound condition, may D/C ordered dressing product/s and apply Normal Saline Moist Dressing daily until next Wound Healing Center / Other MD appointment. Notify Wound Healing Center of regression in wound condition at [phone]. Please direct any NON-WOUND related issues/requests for orders to patient's Primary Care Physician Negative Pressure Wound Therapy: Wound #9 Right Achilles: Discontinue NPWT. After reviewing her wounds today I have recommended: 1. silver alginate packing for both the abdomen wounds. Theone of the left lateral has some oozing of blood and silver alginate will help this. 2. The right posterior ankle where the Achilles tendon has been excised today will need packing with silver alginate and I have discontinued the wound VAC and asked them to return this. 3. the heel wounds are dry including the one at the base of the right fourth toe amputation site and we will obtain these with Betadine. Electronic  Signature(s) DIANELY, KREHBIEL ([id number]) Signed: 01/14/2016 10:52:54 AM By: Evlyn Kanner MD, FACS Entered By: Evlyn Kanner on 01/14/2016 10:52:53 LALANIA, HASEMAN ([id number]) -------------------------------------------------------------------------------- SuperBill Details Patient Name: MARTHELLA, OSORNO. Date of Service: 01/14/2016 Medical Record Number: [id number] Patient Account Number: [id number] Date of Birth/Sex: 1933/07/18 ([name]) Treating RN: Curtis Sites Primary Care Physician: [name] Other Clinician: Referring Physician: [name] Treating Physician/Extender: Rudene Re in Treatment: 4 Diagnosis Coding ICD-10 Codes Code Description E11.621 Type 2 diabetes mellitus with foot ulcer Unspecified open wound of abdominal wall, left lower quadrant without penetration into S31.104A peritoneal cavity, initial encounter E11.51 Type 2 diabetes mellitus with diabetic peripheral angiopathy without gangrene Facility Procedures CPT4: Description Modifier Quantity Code [id number] 11042 - DEB SUBQ TISSUE 20 SQ CM/< 1 ICD-10 Description Diagnosis E11.621 Type 2 diabetes mellitus with foot ulcer S31.104A Unspecified open wound of abdominal wall, left lower quadrant without  penetration into peritoneal cavity, initial encounter E11.51 Type 2 diabetes mellitus with diabetic peripheral angiopathy without gangrene CPT4: [id number] 11045 - DEB SUBQ TISS EA ADDL 20CM 1 ICD-10 Description Diagnosis E11.621 Type 2 diabetes mellitus with foot ulcer S31.104A Unspecified open wound of abdominal wall, left lower quadrant without penetration into peritoneal cavity, initial  encounter E11.51 Type 2 diabetes mellitus with diabetic peripheral angiopathy without gangrene Physician Procedures CPT4: Description Modifier Quantity Code [id number] 99213 - WC PHYS LEVEL 3 - EST PT 25 1 ICD-10 Description Diagnosis E11.621 Type 2 diabetes mellitus with foot ulcer DASHA, KAWABATA  ([id number]) Electronic Signature(s) Signed: 01/14/2016 10:53:18 AM By: Evlyn Kanner  MD, FACS Entered By: Evlyn Kanner on 01/14/2016 10:53:16

## 2016-02-04 ENCOUNTER — Encounter: Payer: Medicare Other | Attending: Internal Medicine | Admitting: Internal Medicine

## 2016-02-04 DIAGNOSIS — L97319 Non-pressure chronic ulcer of right ankle with unspecified severity: Secondary | ICD-10-CM | POA: Diagnosis not present

## 2016-02-04 DIAGNOSIS — T8781 Dehiscence of amputation stump: Secondary | ICD-10-CM | POA: Insufficient documentation

## 2016-02-04 DIAGNOSIS — S31104A Unspecified open wound of abdominal wall, left lower quadrant without penetration into peritoneal cavity, initial encounter: Secondary | ICD-10-CM | POA: Diagnosis not present

## 2016-02-04 DIAGNOSIS — I132 Hypertensive heart and chronic kidney disease with heart failure and with stage 5 chronic kidney disease, or end stage renal disease: Secondary | ICD-10-CM | POA: Insufficient documentation

## 2016-02-04 DIAGNOSIS — I482 Chronic atrial fibrillation: Secondary | ICD-10-CM | POA: Diagnosis not present

## 2016-02-04 DIAGNOSIS — L89612 Pressure ulcer of right heel, stage 2: Secondary | ICD-10-CM | POA: Insufficient documentation

## 2016-02-04 DIAGNOSIS — I5033 Acute on chronic diastolic (congestive) heart failure: Secondary | ICD-10-CM | POA: Diagnosis not present

## 2016-02-04 DIAGNOSIS — E1122 Type 2 diabetes mellitus with diabetic chronic kidney disease: Secondary | ICD-10-CM | POA: Insufficient documentation

## 2016-02-04 DIAGNOSIS — E11621 Type 2 diabetes mellitus with foot ulcer: Secondary | ICD-10-CM | POA: Diagnosis present

## 2016-02-04 DIAGNOSIS — Z8673 Personal history of transient ischemic attack (TIA), and cerebral infarction without residual deficits: Secondary | ICD-10-CM | POA: Insufficient documentation

## 2016-02-04 DIAGNOSIS — E1151 Type 2 diabetes mellitus with diabetic peripheral angiopathy without gangrene: Secondary | ICD-10-CM | POA: Insufficient documentation

## 2016-02-04 DIAGNOSIS — Z794 Long term (current) use of insulin: Secondary | ICD-10-CM | POA: Diagnosis not present

## 2016-02-04 DIAGNOSIS — N186 End stage renal disease: Secondary | ICD-10-CM | POA: Diagnosis not present

## 2016-02-04 DIAGNOSIS — X58XXXA Exposure to other specified factors, initial encounter: Secondary | ICD-10-CM | POA: Diagnosis not present

## 2016-02-04 DIAGNOSIS — Z992 Dependence on renal dialysis: Secondary | ICD-10-CM | POA: Diagnosis not present

## 2016-02-05 ENCOUNTER — Encounter: Payer: Self-pay | Admitting: Radiology

## 2016-02-05 ENCOUNTER — Emergency Department: Payer: Medicare Other

## 2016-02-05 ENCOUNTER — Emergency Department
Admission: EM | Admit: 2016-02-05 | Discharge: 2016-02-05 | Disposition: A | Discharge status: Home / Self Care | Payer: Medicare Other | Attending: Emergency Medicine | Admitting: Emergency Medicine

## 2016-02-05 DIAGNOSIS — R51 Headache: Secondary | ICD-10-CM | POA: Diagnosis not present

## 2016-02-05 DIAGNOSIS — S51812A Laceration without foreign body of left forearm, initial encounter: Secondary | ICD-10-CM | POA: Diagnosis not present

## 2016-02-05 DIAGNOSIS — I132 Hypertensive heart and chronic kidney disease with heart failure and with stage 5 chronic kidney disease, or end stage renal disease: Secondary | ICD-10-CM | POA: Insufficient documentation

## 2016-02-05 DIAGNOSIS — Z992 Dependence on renal dialysis: Secondary | ICD-10-CM | POA: Diagnosis not present

## 2016-02-05 DIAGNOSIS — Z791 Long term (current) use of non-steroidal anti-inflammatories (NSAID): Secondary | ICD-10-CM | POA: Insufficient documentation

## 2016-02-05 DIAGNOSIS — Z794 Long term (current) use of insulin: Secondary | ICD-10-CM | POA: Insufficient documentation

## 2016-02-05 DIAGNOSIS — Z7901 Long term (current) use of anticoagulants: Secondary | ICD-10-CM | POA: Diagnosis not present

## 2016-02-05 DIAGNOSIS — W1809XA Striking against other object with subsequent fall, initial encounter: Secondary | ICD-10-CM | POA: Insufficient documentation

## 2016-02-05 DIAGNOSIS — E1122 Type 2 diabetes mellitus with diabetic chronic kidney disease: Secondary | ICD-10-CM | POA: Insufficient documentation

## 2016-02-05 DIAGNOSIS — Y929 Unspecified place or not applicable: Secondary | ICD-10-CM | POA: Diagnosis not present

## 2016-02-05 DIAGNOSIS — Y999 Unspecified external cause status: Secondary | ICD-10-CM | POA: Diagnosis not present

## 2016-02-05 DIAGNOSIS — N186 End stage renal disease: Secondary | ICD-10-CM | POA: Insufficient documentation

## 2016-02-05 DIAGNOSIS — S01112A Laceration without foreign body of left eyelid and periocular area, initial encounter: Secondary | ICD-10-CM | POA: Insufficient documentation

## 2016-02-05 DIAGNOSIS — Y939 Activity, unspecified: Secondary | ICD-10-CM | POA: Diagnosis not present

## 2016-02-05 DIAGNOSIS — T148XXA Other injury of unspecified body region, initial encounter: Secondary | ICD-10-CM

## 2016-02-05 DIAGNOSIS — W19XXXA Unspecified fall, initial encounter: Secondary | ICD-10-CM

## 2016-02-05 DIAGNOSIS — Z79899 Other long term (current) drug therapy: Secondary | ICD-10-CM | POA: Insufficient documentation

## 2016-02-05 DIAGNOSIS — I509 Heart failure, unspecified: Secondary | ICD-10-CM | POA: Insufficient documentation

## 2016-02-05 DIAGNOSIS — S59902A Unspecified injury of left elbow, initial encounter: Secondary | ICD-10-CM | POA: Diagnosis present

## 2016-02-05 DIAGNOSIS — S51012A Laceration without foreign body of left elbow, initial encounter: Secondary | ICD-10-CM | POA: Insufficient documentation

## 2016-02-05 LAB — CBC WITH DIFFERENTIAL/PLATELET
Basophils Absolute: 0.1 10*3/uL (ref 0–0.1)
Basophils Relative: 1 %
EOS PCT: 2 %
Eosinophils Absolute: 0.2 10*3/uL (ref 0–0.7)
HCT: 31 % — ABNORMAL LOW (ref 35.0–47.0)
Hemoglobin: 10.1 g/dL — ABNORMAL LOW (ref 12.0–16.0)
LYMPHS ABS: 0.7 10*3/uL — AB (ref 1.0–3.6)
LYMPHS PCT: 7 %
MCH: 29.4 pg (ref 26.0–34.0)
MCHC: 32.4 g/dL (ref 32.0–36.0)
MCV: 90.9 fL (ref 80.0–100.0)
MONO ABS: 0.8 10*3/uL (ref 0.2–0.9)
Monocytes Relative: 8 %
Neutro Abs: 8.3 10*3/uL — ABNORMAL HIGH (ref 1.4–6.5)
Neutrophils Relative %: 82 %
PLATELETS: 272 10*3/uL (ref 150–440)
RBC: 3.41 MIL/uL — AB (ref 3.80–5.20)
RDW: 20 % — ABNORMAL HIGH (ref 11.5–14.5)
WBC: 10.1 10*3/uL (ref 3.6–11.0)

## 2016-02-05 LAB — PROTIME-INR
INR: 2.78
Prothrombin Time: 29.9 seconds — ABNORMAL HIGH (ref 11.4–15.2)

## 2016-02-05 LAB — BASIC METABOLIC PANEL
Anion gap: 11 (ref 5–15)
BUN: 35 mg/dL — AB (ref 6–20)
CO2: 23 mmol/L (ref 22–32)
Calcium: 8.2 mg/dL — ABNORMAL LOW (ref 8.9–10.3)
Chloride: 97 mmol/L — ABNORMAL LOW (ref 101–111)
Creatinine, Ser: 3.56 mg/dL — ABNORMAL HIGH (ref 0.44–1.00)
GFR calc Af Amer: 13 mL/min — ABNORMAL LOW (ref >60)
GFR, EST NON AFRICAN AMERICAN: 11 mL/min — AB (ref >60)
GLUCOSE: 199 mg/dL — AB (ref 65–99)
POTASSIUM: 3.7 mmol/L (ref 3.5–5.1)
Sodium: 131 mmol/L — ABNORMAL LOW (ref 135–145)

## 2016-02-05 MED ORDER — BACITRACIN ZINC 500 UNIT/GM EX OINT
TOPICAL_OINTMENT | Freq: Two times a day (BID) | CUTANEOUS | Status: DC
  Administered 2016-02-05: 1 via TOPICAL
  Filled 2016-02-05: qty 0.9

## 2016-02-05 NOTE — ED Notes (Signed)
Discharge instructions reviewed with patient. Questions fielded by this RN. Patient verbalizes understanding of instructions. Patient discharged home in stable condition per Goodman MD . No acute distress noted at time of discharge.   

## 2016-02-05 NOTE — Discharge Instructions (Signed)
Please seek medical attention for any high fevers, chest pain, shortness of breath, change in behavior, persistent vomiting, bloody stool or any other new or concerning symptoms.  

## 2016-02-05 NOTE — ED Triage Notes (Signed)
Pt fell outside this morning on the concrete, hit head, pt has small laceration to forehead, unknown if pt had a LOC, pt went to dialysis today after fall, pt is on Coumadin complaining of a headache

## 2016-02-05 NOTE — ED Notes (Signed)
Nonadherent dressing and gauze wrap applied to large skin tear on left arm

## 2016-02-05 NOTE — ED Notes (Signed)
Pt fell out of w/c this morning on the concrete,pt fell face first, hit head, pt has small laceration to forehead near left eye brow, pt denies LOC, pt went to dialysis today after fall,Fistula to right upper arm intact with bruit and thriller, pt is on Coumadin. Pt c/o headache and blurred vision. Pt noted to have skin tears to left arm. Abrasion to right knee left shoulder. Per DTR pt has open wounds to right foot with odor and healing open wounds to abd on both sides.

## 2016-02-05 NOTE — ED Notes (Signed)
MD at bedside. 

## 2016-02-06 NOTE — Progress Notes (Signed)
[name], [name] ([id number]) Visit Report for 02/04/2016 Arrival Information Details Patient Name: [name], [name]. Date of Service: 02/04/2016 8:00 AM Medical Record Patient Account Number: [id number] Number: Treating RN: Huel Coventry 06-06-1933 (82 [name] Other Clinician: Date of Birth/Sex: Female) Treating ROBSON, MICHAEL Primary Care Physician: [name] Physician/Extender: G Referring Physician: Charolotte Capuchin in Treatment: 7 Visit Information History Since Last Visit Added or deleted any medications: No Patient Arrived: Wheel Chair Any new allergies or adverse reactions: No Arrival Time: 08:06 Had a fall or experienced change in No Accompanied By: caregiver activities of daily living that may affect Transfer Assistance: Manual risk of falls: Patient Identification Verified: Yes Signs or symptoms of abuse/neglect since last No Secondary Verification Process Yes visito Completed: Hospitalized since last visit: No Patient Requires Transmission- No Has Dressing in Place as Prescribed: Yes Based Precautions: Has Compression in Place as Prescribed: Yes Patient Has Alerts: Yes Pain Present Now: No Patient Alerts: Patient on Blood Thinner DMII Warfarin ABI [place] Bilateral NO BP RIGHT ARM Electronic Signature(s) Signed: 02/05/2016 5:55:03 PM By: Elliot Gurney, RN, BSN, Kim RN, BSN Entered By: Elliot Gurney, RN, BSN, Kim on 02/04/2016 08:07:48 [name] ([id number]) -------------------------------------------------------------------------------- Encounter Discharge Information Details Patient Name: [name], [name]. Date of Service: 02/04/2016 8:00 AM Medical Record Patient Account Number: [id number] Number: Treating RN: Huel Coventry 1933/08/05 (82 [name] Other Clinician: Date of Birth/Sex: Female) Treating ROBSON, MICHAEL Primary Care Physician: [name] Physician/Extender: G Referring Physician: Charolotte Capuchin in Treatment: 7 Encounter  Discharge Information Items Discharge Pain Level: 0 Discharge Condition: Stable Ambulatory Status: Wheelchair Discharge Destination: Home Transportation: Private Auto Accompanied By: daughter Schedule Follow-up Appointment: Yes Medication Reconciliation completed Yes and provided to Patient/Care [name]: Provided on Clinical Summary of Care: 02/04/2016 Form Type Recipient Paper Patient [facility] Electronic Signature(s) Signed: 02/05/2016 5:55:03 PM By: Elliot Gurney RN, BSN, Kim RN, BSN Previous Signature: 02/04/2016 9:03:37 AM Version By: Gwenlyn Perking Entered By: Elliot Gurney RN, BSN, Kim on 02/04/2016 09:09:12 [name] ([id number]) -------------------------------------------------------------------------------- Lower Extremity Assessment Details Patient Name: [name]. Date of Service: 02/04/2016 8:00 AM Medical Record Patient Account Number: [id number] Number: Treating RN: Huel Coventry 1934/04/15 (82 [name] Other Clinician: Date of Birth/Sex: Female) Treating ROBSON, MICHAEL Primary Care Physician: [name] Physician/Extender: G Referring Physician: Charolotte Capuchin in Treatment: 7 Edema Assessment Assessed: [Left: No] [Right: No] Edema: [Left: No] [Right: No] Vascular Assessment Pulses: Posterior Tibial Dorsalis Pedis Palpable: [Left:Yes] [Right:No] Doppler: [Right:Monophasic] Extremity colors, hair growth, and conditions: Extremity Color: [Left:Normal] [Right:Normal] Hair Growth on Extremity: [Left:Yes] [Right:Yes] Temperature of Extremity: [Left:Warm] [Right:Warm] Capillary Refill: [Left:< 3 seconds] [Right:> 3 seconds] Dependent Rubor: [Left:No] [Right:No] Blanched when Elevated: [Left:No] [Right:No] Lipodermatosclerosis: [Left:No] [Right:No] Toe Nail Assessment Left: Right: Thick: No No Discolored: No No Deformed: No No Improper Length and Hygiene: No No Electronic Signature(s) Signed: 02/05/2016 5:55:03 PM By: Elliot Gurney, RN, BSN, Kim RN, BSN Entered  By: Elliot Gurney, RN, BSN, Kim on 02/04/2016 52:84:13 [name] ([id number]) -------------------------------------------------------------------------------- Multi Wound Chart Details Patient Name: [name]. Date of Service: 02/04/2016 8:00 AM Medical Record Patient Account Number: [id number] Number: Treating RN: Huel Coventry 20-Sep-1933 (82 [name] 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 Secondary Etiology: N/A N/A N/A Comorbid History: Arrhythmia, Congestive Arrhythmia, Congestive Arrhythmia, Congestive Heart Failure, Heart Failure, Heart Failure, Hypertension, Type II Hypertension, Type II Hypertension, Type II Diabetes Diabetes Diabetes Date Acquired: 06/02/2015 08/12/2015 06/10/2015 Weeks of Treatment: 7 7 7  Wound Status: Open Open Open Pending Amputation on Yes No No Presentation: Measurements L x W x D 1.4x1.5x0.1 2.5x5.5x0.8 1x6.5x0.7 (cm) Area (cm) : 1.649 10.799 5.105 Volume (cm) : 0.165 8.639 3.574 % Reduction in Area: 10.30% 72.70% 71.10% % Reduction in Volume: 55.20% 89.10% 90.80% [name], Natalie Y. ([id number]) Classification: Grade 1 Full Thickness Without Full Thickness Without Exposed Support Exposed Support Structures Structures Exudate Amount: Medium Large Large Exudate Type: Serous Serous Serous Exudate Color: amber Media planneramber amber Foul Odor After Yes Yes Yes Cleansing: Odor  Anticipated Due to No No No Product Use: Wound Margin: Flat and Intact Flat and Intact Flat and Intact Granulation Amount: Medium (34-66%) Large (67-100%) Large (67-100%) Granulation Quality: Pink Red Red Necrotic Amount: Medium (34-66%) Small (1-33%) Small (1-33%) Necrotic Tissue: Eschar Eschar, Adherent Slough Adherent Slough Exposed Structures: Fascia: No Fascia: No Fascia: No Fat: No Fat: No Fat: No Tendon: No Tendon: No Tendon: No Muscle: No Muscle: No Muscle: No Joint: No Joint: No Joint: No Bone: No Bone: No Bone: No Limited to Skin Limited to Skin Limited to Skin Breakdown Breakdown Breakdown Epithelialization: None None None Periwound Skin Texture: Edema: No Edema: No Edema: No Excoriation: No Excoriation: No Excoriation: No Induration: No Induration: No Induration: No Callus: No Callus: No Callus: No Crepitus: No Crepitus: No Crepitus: No Fluctuance: No Fluctuance: No Fluctuance: No Friable: No Friable: No Friable: No Rash: No Rash: No Rash: No Scarring: No Scarring: No Scarring: No Periwound Skin Dry/Scaly: Yes Moist: Yes Moist: Yes Moisture: Maceration: No Maceration: No Maceration: No Moist: No Dry/Scaly: No Dry/Scaly: No Periwound Skin Color: Erythema: Yes Erythema: Yes Erythema: Yes Atrophie Blanche: No Atrophie Blanche: No Atrophie Blanche: No Cyanosis: No Cyanosis: No Cyanosis: No Ecchymosis: No Ecchymosis: No Ecchymosis: No Hemosiderin Staining: No Hemosiderin Staining: No Hemosiderin Staining: No Mottled: No Mottled: No Mottled: No Pallor: No Pallor: No Pallor: No Rubor: No Rubor: No Rubor: No Erythema Location: Circumferential Circumferential Circumferential Temperature: No Abnormality No Abnormality No Abnormality Tenderness on Yes Yes Yes Palpation: Wound Preparation: Ulcer Cleansing: Ulcer Cleansing: Ulcer Cleansing: Rinsed/Irrigated with Rinsed/Irrigated with Rinsed/Irrigated with [name], Natalie Y.  ([id number]) Saline Saline Saline Topical Anesthetic Topical Anesthetic Topical Anesthetic Applied: Other: lidocaine Applied: Other: lidocaine Applied: Other: lidocaine 4% 4% 4% Wound Number: [id number]  Photos: Wound Location: Left Calcaneus Right Amputation Site - Right Achilles Digit Wounding Event: Gradually Appeared Surgical Injury Gradually Appeared 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 Diabetic Wound/Ulcer of the Lower Extremity the Lower Extremity Secondary Etiology: N/A N/A Arterial Insufficiency Ulcer Comorbid History: Arrhythmia, Congestive Arrhythmia, Congestive Arrhythmia, Congestive Heart Failure, Heart Failure, Heart Failure, Hypertension, Type II Hypertension, Type II Hypertension, Type II Diabetes Diabetes Diabetes Date Acquired: 12/31/2015 11/11/2015 06/02/2015 Weeks of Treatment: 5 5 7  Wound Status: Open Open Open Pending Amputation on No No Yes Presentation: Measurements L x W x D 0.8x0.4x0.1 0.5x0.7x0.1 11x4x0.4 (cm) Area (cm) : 0.251 0.275 34.558 Volume (cm) : 0.025 0.027 13.823 % Reduction in Area: 3.10% 63.50% -6.00% % Reduction in Volume: 67.90% 64.00% -41.40% Classification: Grade 1 Full Thickness Without Grade 2 Exposed Support Structures Exudate Amount: Medium Medium Large Exudate Type: Serosanguineous Serous  Serosanguineous Exudate Color: red, brown amber red, brown Foul Odor After No No Yes Cleansing: Odor Anticipated Due to N/A N/A No Product Use: Wound Margin: Flat and Intact Flat and Intact Thickened Granulation Amount: None Present (0%) None Present (0%) Large (67-100%) Granulation Quality: N/A N/A Red Necrotic Amount: Large (67-100%) Large (67-100%) Small (1-33%) [name], [name] ([id number]) Necrotic Tissue: Eschar Eschar, Adherent Slough Eschar, Adherent Slough Exposed Structures: Fascia: No Fascia: No Tendon: Yes Fat: No Fat: No Fascia: No Tendon: No Tendon: No Fat: No Muscle: No Muscle: No Muscle:  No Joint: No Joint: No Joint: No Bone: No Bone: No Bone: No Limited to Skin Limited to Skin Breakdown Breakdown Epithelialization: Medium (34-66%) None None Periwound Skin Texture: Edema: No Edema: No Edema: No Excoriation: No Excoriation: No Excoriation: No Induration: No Induration: No Induration: No Callus: No Callus: No Callus: No Crepitus: No Crepitus: No Crepitus: No Fluctuance: No Fluctuance: No Fluctuance: No Friable: No Friable: No Friable: No Rash: No Rash: No Rash: No Scarring: No Scarring: No Scarring: No Periwound Skin Moist: Yes Maceration: Yes Maceration: Yes Moisture: Maceration: No Moist: Yes Moist: Yes Dry/Scaly: No Dry/Scaly: No Dry/Scaly: No Periwound Skin Color: Atrophie Blanche: No Atrophie Blanche: No Erythema: Yes Cyanosis: No Cyanosis: No Atrophie Blanche: No Ecchymosis: No Ecchymosis: No Cyanosis: No Erythema: No Erythema: No Ecchymosis: No Hemosiderin Staining: No Hemosiderin Staining: No Hemosiderin Staining: No Mottled: No Mottled: No Mottled: No Pallor: No Pallor: No Pallor: No Rubor: No Rubor: No Rubor: No Erythema Location: N/A N/A Circumferential Temperature: No Abnormality No Abnormality N/A Tenderness on No No Yes Palpation: Wound Preparation: Ulcer Cleansing: Ulcer Cleansing: Ulcer Cleansing: Rinsed/Irrigated with Rinsed/Irrigated with Rinsed/Irrigated with Saline Saline Saline Topical Anesthetic Topical Anesthetic Topical Anesthetic Applied: Other: lidocaine Applied: Other: lidocaine Applied: Other: lidocaine 4% 4% 4% Treatment Notes Electronic Signature(s) Signed: 02/05/2016 5:55:03 PM By: Elliot Gurney, RN, BSN, Kim RN, BSN Entered By: Elliot Gurney, RN, BSN, Kim on 02/04/2016 08:42:21 [name] ([id number]) -------------------------------------------------------------------------------- Multi-Disciplinary Care Plan Details Patient Name: SHAWNEEN, DEETZ. Date of Service: 02/04/2016 8:00 AM Medical  Record Patient Account Number: [id number] Number: Treating RN: Huel Coventry 01-08-1934 (82 [name] Other Clinician: Date of Birth/Sex: Female) Treating ROBSON, MICHAEL Primary Care Physician: [name] Physician/Extender: G Referring Physician: Charolotte Capuchin in Treatment: 7 Active Inactive Abuse / Safety / Falls / Self Care Management Nursing Diagnoses: Impaired physical mobility Potential for falls Goals: Patient will remain injury free Date Initiated: 12/17/2015 Goal Status: Active Interventions: Assess fall risk on admission and as needed Notes: Necrotic Tissue Nursing Diagnoses: Impaired tissue integrity related to necrotic/devitalized tissue Knowledge deficit related to management of necrotic/devitalized tissue Goals: Necrotic/devitalized tissue will be minimized in the wound bed Date Initiated: 02/04/2016 Goal Status: Active Interventions: Assess patient pain level pre-, during and post procedure and prior to discharge Provide education on necrotic tissue and debridement process Treatment Activities: Apply topical anesthetic as ordered : 02/04/2016 Notes: DEBARAH, MCCUMBERS ([id number]) Wound/Skin Impairment Nursing Diagnoses: Impaired tissue integrity Goals: Patient/caregiver will verbalize understanding of skin care regimen Date Initiated: 12/17/2015 Goal Status: Active Ulcer/skin breakdown will have a volume reduction of 30% by week 4 Date Initiated: 12/17/2015 Goal Status: Active Ulcer/skin breakdown will have a volume reduction of 50% by week 8 Date Initiated: 12/17/2015 Goal Status: Active Ulcer/skin breakdown will have a volume reduction of 80% by week 12 Date Initiated: 12/17/2015 Goal Status: Active Ulcer/skin breakdown will heal within 14 weeks Date Initiated: 12/17/2015 Goal Status: Active Interventions: Assess patient/caregiver ability to obtain necessary  supplies Assess patient/caregiver ability to perform ulcer/skin care regimen upon  admission and as needed Assess ulceration(s) every visit Notes: Electronic Signature(s) Signed: 02/05/2016 5:55:03 PM By: Elliot Gurney, RN, BSN, Kim RN, BSN Entered By: Elliot Gurney, RN, BSN, Kim on 02/04/2016 08:37:44 [name] ([id number]) -------------------------------------------------------------------------------- Pain Assessment Details Patient Name: [name]. Date of Service: 02/04/2016 8:00 AM Medical Record Patient Account Number: [id number] Number: Treating RN: Huel Coventry 07/26/1933 (82 [name] Other Clinician: Date of Birth/Sex: Female) Treating ROBSON, MICHAEL Primary Care Physician: [name] Physician/Extender: G Referring Physician: Charolotte Capuchin in Treatment: 7 Active Problems Location of Pain Severity and Description of Pain Patient Has Paino No Site Locations With Dressing Change: No Pain Management and Medication Current Pain Management: Electronic Signature(s) Signed: 02/05/2016 5:55:03 PM By: Elliot Gurney, RN, BSN, Kim RN, BSN Entered By: Elliot Gurney, RN, BSN, Kim on 02/04/2016 08:07:55 [name] ([id number]) -------------------------------------------------------------------------------- Patient/Caregiver Education Details Patient Name: [name] Date of Service: 02/04/2016 8:00 AM Medical Record Patient Account Number: [id number] Number: Treating RN: Huel Coventry [birth year] (82 [name] Other Clinician: Date of Birth/Gender: Female) Treating ROBSON, MICHAEL Primary Care Physician: [name] Physician/Extender: G Referring Physician: Charolotte Capuchin in Treatment: 7 Education Assessment Education Provided To: Patient Education Topics Provided Infection: Handouts: Infection Prevention and Management, Other: cntinue abx at dialysis Methods: Demonstration, Explain/Verbal Responses: State content correctly Wound Debridement: Handouts: Wound Debridement Methods: Demonstration, Explain/Verbal Responses: State content  correctly Electronic Signature(s) Signed: 02/05/2016 5:55:03 PM By: Elliot Gurney, RN, BSN, Kim RN, BSN Entered By: Elliot Gurney, RN, BSN, Kim on 02/04/2016 09:09:48 [name] ([id number]) -------------------------------------------------------------------------------- Wound Assessment Details Patient Name: [name]. Date of Service: 02/04/2016 8:00 AM Medical Record Patient Account Number: [id number] Number: Treating RN: Huel Coventry 11/08/1933 (82 [name] Other Clinician: Date of Birth/Sex: Female) Treating ROBSON, MICHAEL Primary Care Physician: [name] Physician/Extender: G Referring Physician: Charolotte Capuchin in Treatment: 7 Wound Status Wound Number: 10 Primary Diabetic Wound/Ulcer of the Lower Etiology: Extremity Wound Location: Right Calcaneus Wound Open Wounding Event: Gradually Appeared Status: Date Acquired: 06/02/2015 Comorbid Arrhythmia, Congestive Heart Failure, Weeks Of Treatment: 7 History: Hypertension, Type II Diabetes Clustered Wound: No Pending Amputation On Presentation Photos Wound Measurements Length: (cm) 1.4 % Reduction in Area: Width: (cm) 1.5 % Reduction in Volum Depth: (cm) 0.1 Epithelialization: Area: (cm) 1.649 Tunneling: Volume: (cm) 0.165 Undermining: 10.3% e: 55.2% None No No Wound Description Classification: Grade 1 Foul Odor After Cle Wound Margin: Flat and Intact Due to Product Use: Exudate Amount: Medium Exudate Type: Serous Exudate Color: amber ansing: Yes No Wound Bed Granulation Amount: Medium (34-66%) Exposed Structure Granulation Quality: Pink Fascia Exposed: No [name], [name] ([id number]) Necrotic Amount: Medium (34-66%) Fat Layer Exposed: No Necrotic Quality: Eschar Tendon Exposed: No Muscle Exposed: No Joint Exposed: No Bone Exposed: No Limited to Skin Breakdown Periwound Skin Texture Texture Color No Abnormalities Noted: No No Abnormalities Noted: No Callus: No Atrophie Blanche:  No Crepitus: No Cyanosis: No Excoriation: No Ecchymosis: No Fluctuance: No Erythema: Yes Friable: No Erythema Location: Circumferential Induration: No Hemosiderin Staining: No Localized Edema: No Mottled: No Rash: No Pallor: No Scarring: No Rubor: No Moisture Temperature / Pain No Abnormalities Noted: No Temperature: No Abnormality Dry / Scaly: Yes Tenderness on Palpation: Yes Maceration: No Moist: No Wound Preparation Ulcer Cleansing: Rinsed/Irrigated with Saline Topical Anesthetic Applied: Other: lidocaine 4%, Treatment Notes Wound #10 (Right Calcaneus) 1. Cleansed with: Clean wound with Normal Saline 2. Anesthetic Topical Lidocaine 4% cream to wound bed prior to  debridement 4. Dressing Applied: Aquacel Ag 5. Secondary Dressing Applied Guaze, ABD and kerlix/Conform 7. Secured with Secretary/administrator) Signed: 02/05/2016 5:55:03 PM By: Elliot Gurney, RN, BSN, Kim RN, BSN Entered By: Elliot Gurney, RN, BSN, Kim on 02/04/2016 08:27:52 BLONNIE, MASKE ([id number]) MADYSON, LUKACH ([id number]) -------------------------------------------------------------------------------- Wound Assessment Details Patient Name: DAPHNE, KARRER. Date of Service: 02/04/2016 8:00 AM Medical Record Patient Account Number: [id number] Number: Treating RN: Huel Coventry 09/06/1933 (82 [name] Other Clinician: Date of Birth/Sex: Female) Treating ROBSON, MICHAEL Primary Care Physician: [name] Physician/Extender: G Referring Physician: Charolotte Capuchin in Treatment: 7 Wound Status Wound Number: 11 Primary Calciphylaxis Etiology: Wound Location: Abdomen - Lower Quadrant - Midline Wound Open Status: Wounding Event: Trauma Comorbid Arrhythmia, Congestive Heart Failure, Date Acquired: 08/12/2015 History: Hypertension, Type II Diabetes Weeks Of Treatment: 7 Clustered Wound: No Photos Wound Measurements Length: (cm) 2.5 Width: (cm) 5.5 Depth: (cm) 0.8 Area: (cm)  10.799 Volume: (cm) 8.639 % Reduction in Area: 72.7% % Reduction in Volume: 89.1% Epithelialization: None Tunneling: No Undermining: No Wound Description Full Thickness Without Exposed Classification: Support Structures Wound Margin: Flat and Intact Exudate Large Amount: Exudate Type: Serous Exudate Color: amber Foul Odor After Cleansing: Yes Due to Product Use: No Wound Bed Granulation Amount: Large (67-100%) Exposed Structure [name], [name] ([id number]) Granulation Quality: Red Fascia Exposed: No Necrotic Amount: Small (1-33%) Fat Layer Exposed: No Necrotic Quality: Eschar, Adherent Slough Tendon Exposed: No Muscle Exposed: No Joint Exposed: No Bone Exposed: No Limited to Skin Breakdown Periwound Skin Texture Texture Color No Abnormalities Noted: No No Abnormalities Noted: No Callus: No Atrophie Blanche: No Crepitus: No Cyanosis: No Excoriation: No Ecchymosis: No Fluctuance: No Erythema: Yes Friable: No Erythema Location: Circumferential Induration: No Hemosiderin Staining: No Localized Edema: No Mottled: No Rash: No Pallor: No Scarring: No Rubor: No Moisture Temperature / Pain No Abnormalities Noted: No Temperature: No Abnormality Dry / Scaly: No Tenderness on Palpation: Yes Maceration: No Moist: Yes Wound Preparation Ulcer Cleansing: Rinsed/Irrigated with Saline Topical Anesthetic Applied: Other: lidocaine 4%, Treatment Notes Wound #11 (Midline Abdomen - Lower Quadrant) 1. Cleansed with: Clean wound with Normal Saline 2. Anesthetic Topical Lidocaine 4% cream to wound bed prior to debridement 4. Dressing Applied: Aquacel Ag 5. Secondary Dressing Applied Guaze, ABD and kerlix/Conform 7. Secured with Secretary/administrator) Signed: 02/05/2016 5:55:03 PM By: Elliot Gurney, RN, BSN, Kim RN, BSN Entered By: Elliot Gurney, RN, BSN, Kim on 02/04/2016 08:65:78 HIMANI, CORONA ([id number], PRO  ([id number]) -------------------------------------------------------------------------------- Wound Assessment Details Patient Name: DAZIAH, HESLER. Date of Service: 02/04/2016 8:00 AM Medical Record Patient Account Number: [id number] Number: Treating RN: Huel Coventry 1933/10/21 (82 [name] Other Clinician: Date of Birth/Sex: Female) Treating ROBSON, MICHAEL Primary Care Physician: [name] Physician/Extender: G Referring Physician: Charolotte Capuchin in Treatment: 7 Wound Status Wound Number: 12 Primary Calciphylaxis Etiology: Wound Location: Left Abdomen - Lower Quadrant Wound Open Status: Wounding Event: Trauma Comorbid Arrhythmia, Congestive Heart Failure, Date Acquired: 06/10/2015 History: Hypertension, Type II Diabetes Weeks Of Treatment: 7 Clustered Wound: No Photos Wound Measurements Length: (cm) 1 % Reduction in Ar Width: (cm) 6.5 % Reduction in Vo Depth: (cm) 0.7 Epithelialization Area: (cm) 5.105 Tunneling: Volume: (cm) 3.574 Undermining: ea: 71.1% lume: 90.8% : None No No Wound Description Full Thickness Without Exposed Foul Odor After Classification: Support Structures Due to Product U Wound Margin: Flat and Intact Exudate Large Amount: Exudate Type: Serous Exudate Color: amber Cleansing: Yes se: No Wound Bed Granulation Amount: Large (67-100%) Exposed Structure [name],  [name] ([id number]) Granulation Quality: Red Fascia Exposed: No Necrotic Amount: Small (1-33%) Fat Layer Exposed: No Necrotic Quality: Adherent Slough Tendon Exposed: No Muscle Exposed: No Joint Exposed: No Bone Exposed: No Limited to Skin Breakdown Periwound Skin Texture Texture Color No Abnormalities Noted: No No Abnormalities Noted: No Callus: No Atrophie Blanche: No Crepitus: No Cyanosis: No Excoriation: No Ecchymosis: No Fluctuance: No Erythema: Yes Friable: No Erythema Location: Circumferential Induration: No Hemosiderin Staining:  No Localized Edema: No Mottled: No Rash: No Pallor: No Scarring: No Rubor: No Moisture Temperature / Pain No Abnormalities Noted: No Temperature: No Abnormality Dry / Scaly: No Tenderness on Palpation: Yes Maceration: No Moist: Yes Wound Preparation Ulcer Cleansing: Rinsed/Irrigated with Saline Topical Anesthetic Applied: Other: lidocaine 4%, Treatment Notes Wound #12 (Left Abdomen - Lower Quadrant) 1. Cleansed with: Clean wound with Normal Saline 2. Anesthetic Topical Lidocaine 4% cream to wound bed prior to debridement 4. Dressing Applied: Aquacel Ag 5. Secondary Dressing Applied Guaze, ABD and kerlix/Conform 7. Secured with Secretary/administrator) Signed: 02/05/2016 5:55:03 PM By: Elliot Gurney, RN, BSN, Kim RN, BSN Entered By: Elliot Gurney, RN, BSN, Kim on 02/04/2016 08:29:43 [name], [name] ([id number], [name] ([id number]) -------------------------------------------------------------------------------- Wound Assessment Details Patient Name: [name], DAQUILA. Date of Service: 02/04/2016 8:00 AM Medical Record Patient Account Number: [id number] Number: Treating RN: Huel Coventry 22-Aug-1933 (82 [name] Other Clinician: Date of Birth/Sex: Female) Treating ROBSON, MICHAEL Primary Care Physician: [name] Physician/Extender: G Referring Physician: Charolotte Capuchin in Treatment: 7 Wound Status Wound Number: 13 Primary Diabetic Wound/Ulcer of the Lower Etiology: Extremity Wound Location: Left Calcaneus Wound Open Wounding Event: Gradually Appeared Status: Date Acquired: 12/31/2015 Comorbid Arrhythmia, Congestive Heart Failure, Weeks Of Treatment: 5 History: Hypertension, Type II Diabetes Clustered Wound: No Photos Wound Measurements Length: (cm) 0.8 % Reduction in Area Width: (cm) 0.4 % Reduction in Volu Depth: (cm) 0.1 Epithelialization: Area: (cm) 0.251 Tunneling: Volume: (cm) 0.025 Undermining: : 3.1% me: 67.9% Medium  (34-66%) No No Wound Description Classification: Grade 1 Foul Odor After Cl Wound Margin: Flat and Intact Exudate Amount: Medium Exudate Type: Serosanguineous Exudate Color: red, brown eansing: No Wound Bed Granulation Amount: None Present (0%) Exposed Structure Necrotic Amount: Large (67-100%) Fascia Exposed: No Necrotic Quality: Eschar Fat Layer Exposed: No STESHA, NEYENS ([id number]) Tendon Exposed: No Muscle Exposed: No Joint Exposed: No Bone Exposed: No Limited to Skin Breakdown Periwound Skin Texture Texture Color No Abnormalities Noted: No No Abnormalities Noted: No Callus: No Atrophie Blanche: No Crepitus: No Cyanosis: No Excoriation: No Ecchymosis: No Fluctuance: No Erythema: No Friable: No Hemosiderin Staining: No Induration: No Mottled: No Localized Edema: No Pallor: No Rash: No Rubor: No Scarring: No Temperature / Pain Moisture Temperature: No Abnormality No Abnormalities Noted: No Dry / Scaly: No Maceration: No Moist: Yes Wound Preparation Ulcer Cleansing: Rinsed/Irrigated with Saline Topical Anesthetic Applied: Other: lidocaine 4%, Treatment Notes Wound #13 (Left Calcaneus) 1. Cleansed with: Clean wound with Normal Saline 2. Anesthetic Topical Lidocaine 4% cream to wound bed prior to debridement 4. Dressing Applied: Aquacel Ag 5. Secondary Dressing Applied Guaze, ABD and kerlix/Conform 7. Secured with Secretary/administrator) Signed: 02/05/2016 5:55:03 PM By: Elliot Gurney, RN, BSN, Kim RN, BSN Entered By: Elliot Gurney, RN, BSN, Kim on 02/04/2016 08:30:42 [name] ([id number]) -------------------------------------------------------------------------------- Wound Assessment Details Patient Name: ALEKA, TWITTY. Date of Service: 02/04/2016 8:00 AM Medical Record Patient Account Number: [id number] Number: Treating RN: Huel Coventry Oct 06, 1933 (82 [name] Other Clinician: Date of Birth/Sex: Female) Treating ROBSON,  MICHAEL Primary  Care Physician: [name] Physician/Extender: G Referring Physician: Charolotte Capuchin in Treatment: 7 Wound Status Wound Number: 14 Primary Open Surgical Wound Etiology: Wound Location: Right Amputation Site - Digit Wound Open Wounding Event: Surgical Injury Status: Date Acquired: 11/11/2015 Comorbid Arrhythmia, Congestive Heart Failure, Weeks Of Treatment: 5 History: Hypertension, Type II Diabetes Clustered Wound: No Photos Wound Measurements Length: (cm) 0.5 Width: (cm) 0.7 Depth: (cm) 0.1 Area: (cm) 0.275 Volume: (cm) 0.027 % Reduction in Area: 63.5% % Reduction in Volume: 64% Epithelialization: None Tunneling: No Undermining: No Wound Description Full Thickness Without Exposed Classification: Support Structures Wound Margin: Flat and Intact Exudate Medium Amount: Exudate Type: Serous Exudate Color: amber Foul Odor After Cleansing: No Wound Bed Granulation Amount: None Present (0%) Exposed Structure Necrotic Amount: Large (67-100%) Fascia Exposed: No BELYNDA, PAGADUAN ([id number]) Necrotic Quality: Eschar, Adherent Slough Fat Layer Exposed: No Tendon Exposed: No Muscle Exposed: No Joint Exposed: No Bone Exposed: No Limited to Skin Breakdown Periwound Skin Texture Texture Color No Abnormalities Noted: No No Abnormalities Noted: No Callus: No Atrophie Blanche: No Crepitus: No Cyanosis: No Excoriation: No Ecchymosis: No Fluctuance: No Erythema: No Friable: No Hemosiderin Staining: No Induration: No Mottled: No Localized Edema: No Pallor: No Rash: No Rubor: No Scarring: No Temperature / Pain Moisture Temperature: No Abnormality No Abnormalities Noted: No Dry / Scaly: No Maceration: Yes Moist: Yes Wound Preparation Ulcer Cleansing: Rinsed/Irrigated with Saline Topical Anesthetic Applied: Other: lidocaine 4%, Treatment Notes Wound #14 (Right Amputation Site - Digit) 1. Cleansed with: Clean wound with Normal  Saline 2. Anesthetic Topical Lidocaine 4% cream to wound bed prior to debridement 4. Dressing Applied: Aquacel Ag 5. Secondary Dressing Applied Guaze, ABD and kerlix/Conform 7. Secured with Secretary/administrator) Signed: 02/05/2016 5:55:03 PM By: Elliot Gurney, RN, BSN, Kim RN, BSN Entered By: Elliot Gurney, RN, BSN, Kim on 02/04/2016 08:31:21 BELA, BONAPARTE ([id number]) MORRIGAN, WICKENS ([id number]) -------------------------------------------------------------------------------- Wound Assessment Details Patient Name: MILDRED, TUCCILLO. Date of Service: 02/04/2016 8:00 AM Medical Record Patient Account Number: [id number] Number: Treating RN: Huel Coventry 1933-11-04 (82 [name] 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 Appeared Etiology: Date Acquired: 06/02/2015 Wound Open Weeks Of Treatment: 7 Status: Clustered Wound: No Comorbid Arrhythmia, Congestive Heart Pending Amputation On Presentation History: Failure, Hypertension, Type II Diabetes Photos Wound Measurements Length: (cm) 11 % Reduction in Area: Width: (cm) 4 % Reduction in Volum Depth: (cm) 0.4 Epithelialization: Area: (cm) 34.558 Volume: (cm) 13.823 -6% e: -41.4% None Wound Description Classification: Grade 2 Foul Odor After Cle Wound Margin: Thickened Due to Product Use: Exudate Amount: Large Exudate Type: Serosanguineous Exudate Color: red, brown ansing: Yes No Wound Bed Granulation Amount: Large (67-100%) Exposed Structure TOSHA, BELGARDE ([id number]) Granulation Quality: Red Fascia Exposed: No Necrotic Amount: Small (1-33%) Fat Layer Exposed: No Necrotic  Quality: Eschar, Adherent Slough Tendon Exposed: Yes Muscle Exposed: No Joint Exposed: No Bone Exposed: No Periwound Skin Texture Texture Color No Abnormalities Noted: No No Abnormalities Noted: No Callus: No Atrophie Blanche: No Crepitus: No Cyanosis: No Excoriation: No Ecchymosis: No Fluctuance: No Erythema: Yes Friable: No Erythema Location: Circumferential Induration: No Hemosiderin Staining: No Localized Edema: No Mottled: No Rash: No Pallor: No Scarring: No Rubor: No Moisture Temperature / Pain No Abnormalities Noted: No Tenderness on Palpation: Yes  Dry / Scaly: No Maceration: Yes Moist: Yes Wound Preparation Ulcer Cleansing: Rinsed/Irrigated with Saline Topical Anesthetic Applied: Other: lidocaine 4%, Treatment Notes Wound #9 (Right Achilles) 1. Cleansed with: Clean wound with Normal Saline 2. Anesthetic Topical Lidocaine 4% cream to wound bed prior to debridement 4. Dressing Applied: Aquacel Ag 5. Secondary Dressing Applied Guaze, ABD and kerlix/Conform 7. Secured with Secretary/administrator) Signed: 02/05/2016 5:55:03 PM By: Elliot Gurney, RN, BSN, Kim RN, BSN Entered By: Elliot Gurney, RN, BSN, Kim on 02/04/2016 08:32:33 NICLOE, FRONTERA ([id number]) CELENA, LANIUS ([id number]) -------------------------------------------------------------------------------- Vitals Details Patient Name: MELYNDA, KRZYWICKI. Date of Service: 02/04/2016 8:00 AM Medical Record Patient Account Number: [id number] Number: Treating RN: Huel Coventry [birth year] (82 [name] Other Clinician: Date of Birth/Sex: Female) Treating ROBSON, MICHAEL Primary Care Physician: [name] Physician/Extender: G Referring Physician: Charolotte Capuchin in Treatment: 7 Vital Signs Time Taken: 08:07 Temperature (F): 98.2 Height (in): 65 Pulse (bpm): 80 Weight (lbs): 160 Respiratory Rate (breaths/min): 18 Body Mass Index (BMI): 26.6 Blood Pressure (mmHg): 113/62 Reference Range:  80 - 120 mg / dl Electronic Signature(s) Signed: 02/05/2016 5:55:03 PM By: Elliot Gurney, RN, BSN, Kim RN, BSN Entered By: Elliot Gurney, RN, BSN, Kim on 02/04/2016 08:08:51

## 2016-02-06 NOTE — Progress Notes (Addendum)
[name] ([id number]) Visit Report for 02/04/2016 Chief Complaint Document Details Patient Name: [name]. Date of Service: 02/04/2016 8:00 AM Medical Record Patient Account Number: [id number] Number: Treating RN: Huel Coventry January 10, 1934 (82 y.o. Other Clinician: Date of Birth/Sex: Female) Treating ROBSON, MICHAEL Primary Care Physician/Extender: Rex Kras, Heidi Physician: Referring Physician: Charolotte Capuchin in Treatment: 7 Information Obtained from: Patient Chief Complaint Patients presents for treatment of an open diabetic ulcer, arterial ulcer and pressure ulcers to the right lower extremities which is a complex etiology for the last several months. He also has ulcers on her abdomen in the left lower quadrant and left suprapubic area which she's had for about 4 months. 12/17/15; patient returns to clinic apparently referred back from vascular surgery for ongoing wound care. Electronic Signature(s) Signed: 02/05/2016 7:55:14 AM By: Baltazar Najjar MD Entered By: Baltazar Najjar on 02/04/2016 08:50:52 [name] ([id number]) -------------------------------------------------------------------------------- Debridement Details Patient Name: [name] Date of Service: 02/04/2016 8:00 AM Medical Record Patient Account Number: [id number] Number: Treating RN: Huel Coventry [birth year] (82 y.o. Other Clinician: Date of Birth/Sex: Female) Treating ROBSON, MICHAEL Primary Care Physician/Extender: Rex Kras, Heidi Physician: Referring Physician: Charolotte Capuchin in Treatment: 7 Debridement Performed for Wound #11 Midline Abdomen - Lower Quadrant Assessment: Performed By: Physician Maxwell Caul, MD Debridement: Debridement Pre-procedure Yes - 08:40 Verification/Time Out Taken: Start Time: 08:41 Pain Control: Other : lidocaine 4% Level: Skin/Subcutaneous Tissue Total Area Debrided (L x 2.5 (cm) x 5.5 (cm) = 13.75  (cm) W): Tissue and other Viable, Non-Viable, Exudate, Fibrin/Slough, Subcutaneous material debrided: Instrument: Curette Bleeding: Minimum Hemostasis Achieved: Pressure End Time: 08:43 Procedural Pain: 0 Post Procedural Pain: 0 Response to Treatment: Procedure was tolerated well Post Debridement Measurements of Total Wound Length: (cm) 2.5 Width: (cm) 5.5 Depth: (cm) 0.8 Volume: (cm) 8.639 Character of Wound/Ulcer Post Improved Debridement: Severity of Tissue Post Debridement: Limited to breakdown of skin Post Procedure Diagnosis Same as Pre-procedure Electronic Signature(s) Signed: 02/05/2016 7:55:14 AM By: Baltazar Najjar MD [name] ([id number]) Signed: 02/05/2016 5:55:03 PM By: Elliot Gurney, RN, BSN, Kim RN, BSN Entered By: Baltazar Najjar on 02/04/2016 08:49:59 [name] ([id number]) -------------------------------------------------------------------------------- Debridement Details Patient Name: [name]. Date of Service: 02/04/2016 8:00 AM Medical Record Patient Account Number: [id number] Number: Treating RN: Huel Coventry 06-Nov-1933 (82 y.o. Other Clinician: Date of Birth/Sex: Female) Treating ROBSON, MICHAEL Primary Care Physician/Extender: Rex Kras, Heidi Physician: Referring Physician: Charolotte Capuchin in Treatment: 7 Debridement Performed for Wound #12 Left Abdomen - Lower Quadrant Assessment: Performed By: Physician Maxwell Caul, MD Debridement: Debridement Pre-procedure Yes - 08:40 Verification/Time Out Taken: Start Time: 08:41 Pain Control: Other : lidocaine 4% Level: Skin/Subcutaneous Tissue Total Area Debrided (L x 1 (cm) x 6.5 (cm) = 6.5 (cm) W): Tissue and other Viable, Non-Viable, Exudate, Fibrin/Slough, Subcutaneous material debrided: Instrument: Curette Bleeding: Minimum Hemostasis Achieved: Pressure End Time: 08:43 Procedural Pain: 0 Post Procedural Pain: 0 Response to Treatment: Procedure was  tolerated well Post Debridement Measurements of Total Wound Length: (cm) 1 Width: (cm) 6.5 Depth: (cm) 0.7 Volume: (cm) 3.574 Character of Wound/Ulcer Post Improved Debridement: Severity of Tissue Post Debridement: Limited to breakdown of skin Post Procedure Diagnosis Same as Pre-procedure Electronic Signature(s) Signed: 02/05/2016 7:55:14 AM By: Baltazar Najjar MD [name] ([id number]) Signed: 02/05/2016 5:55:03 PM By: Elliot Gurney, RN, BSN, Kim RN, BSN Entered By: Baltazar Najjar on 02/04/2016 08:50:14 [name] ([id number]) -------------------------------------------------------------------------------- Debridement Details Patient Name: [name], [name]. Date of  Service: 02/04/2016 8:00 AM Medical Record Patient Account Number: [id number] Number: Treating RN: Huel Coventry 1933-11-30 (82 y.o. Other Clinician: Date of Birth/Sex: Female) Treating ROBSON, MICHAEL Primary Care Physician/Extender: Rex Kras, Heidi Physician: Referring Physician: Charolotte Capuchin in Treatment: 7 Debridement Performed for Wound #9 Right Achilles Assessment: Performed By: Physician Maxwell Caul, MD Debridement: Debridement Pre-procedure Yes - 08:40 Verification/Time Out Taken: Start Time: 08:41 Pain Control: Other : lidocaine 4% Level: Skin/Subcutaneous Tissue Total Area Debrided (L x 11 (cm) x 4 (cm) = 44 (cm) W): Tissue and other Viable, Non-Viable, Eschar, Exudate, Fat, Fibrin/Slough, Skin, Subcutaneous, material debrided: Tendon Instrument: Blade, Forceps Bleeding: Minimum Hemostasis Achieved: Pressure End Time: 08:43 Procedural Pain: 0 Post Procedural Pain: 0 Response to Treatment: Procedure was tolerated well Post Debridement Measurements of Total Wound Length: (cm) 11 Width: (cm) 4 Depth: (cm) 0.4 Volume: (cm) 13.823 Character of Wound/Ulcer Post Requires Further Debridement Debridement: Severity of Tissue Post Debridement: Fat layer  exposed Post Procedure Diagnosis Same as Pre-procedure Electronic Signature(s) Signed: 02/05/2016 7:55:14 AM By: Baltazar Najjar MD [name] ([id number]) Signed: 02/05/2016 5:55:03 PM By: Elliot Gurney RN, BSN, Kim RN, BSN Entered By: Baltazar Najjar on 02/04/2016 08:50:28 [name] ([id number]) -------------------------------------------------------------------------------- HPI Details Patient Name: [name], [name]. Date of Service: 02/04/2016 8:00 AM Medical Record Patient Account Number: [id number] Number: Treating RN: Huel Coventry Nov 09, 1933 (82 y.o. Other Clinician: Date of Birth/Sex: Female) Treating ROBSON, MICHAEL Primary Care Physician/Extender: Rex Kras, Heidi Physician: Referring Physician: Charolotte Capuchin in Treatment: 7 History of Present Illness Location: several wounds on the right lower extremity including her right healed right posterior ankle and right lower third of the leg. She also has wounds on her left lower quadrant of the abdomen and the suprapubic area. Quality: Patient reports experiencing a sharp pain to affected area(s). Severity: Patient states wound are getting worse. Duration: Patient has had the wound for > 3 months prior to seeking treatment at the wound center Timing: Pain in wound is Intermittent (comes and goes Context: The wound appeared gradually over time Modifying Factors: Other treatment(s) tried include:he simply been admitted to the hospital 2 weeks ago and has had procedures done on her right lower extremity and had a blockage which we are trying to get some notes Associated Signs and Symptoms: Patient reports having increase discharge. HPI Description: 82 year old patient who is known to be diabetic, was referred to [place] by Dr. Gavin Potters for a right heel ulceration which she's had for a while. She was recently in hospital for a pneumonia and at that time and got delirious and was disoriented and sometime during this time  developed a stage II ulcer on her right heel. Her past medical history is significant for bilateral pneumonia which was treated with injectable antibiotics and then to oral Levaquin which he has completed. She also has acute on chronic diastolic CHF, acute on chronic respiratory failure, end-stage renal disease on hemodialysis, atrial fibrillation, recent stroke, diabetes mellitus. The patient and her son are poor historians but from what I understand she was admitted to the hospital with an acute vascular compromise of her right lower extremity and Dr. Wyn Quaker has done a surgical procedure and we are trying to obtain these notes. There are also some vascular workup done and we will try and obtain these notes. the injury to the left lower quadrant of abdomen and the suprapubic area have been there due to a bruise and have been there for several months and no intervention  has been done. 10/11/2015 -- on review of the electronics records it was noted that the patient was admitted to the hospital on 09/14/2015 with peripheral vascular disease with claudication, end-stage renal disease, pressure ulcer, chronic atrial fibrillation. She was seen by Dr. Wyn Quaker who did her right lower extremity angiogram , angioplasty of the right anterior tibial artery and thrombolysis with TPA of the right popliteal artery, and thrombectomy. She was seen by Dr. Wyn Quaker during this past week and he was pleased with the progress. He did say that if he took her to the operating room for any procedure he would debride the abdominal wound under anesthesia. She was also seen by Dr. Ether Griffins the podiatrist who thought that she may lose her right fourth toe at some stage may need an amputation of this. [name], [name] ([id number]) 10/21/2015 --patient known to Dr. Wyn Quaker and his last office visit from 10/04/2015 has been reviewed. 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 She also had a persistent ulceration of the right heel and markedly this area and a right third and fourth toe and a small scab on the calf but these are dry and seemed to be improving. Patient also has a left carotid endarterectomy and multiple interventions to a right brachiocephalic AV fistula. After the visit he had recommended noninvasive studies to recheck her revascularization. He was off the impression that she would likely lose the right fourth toe and the third toe was likely to heal. He was concerned about underlying muscle necrosis on her right heel and midfoot. 11/01/2015 -- an echo done in January of this year showed her left ventricular ejection fraction to be about 50-55%. The patient was seen by the PA and Dr. Driscilla Grammes office and the plan was to take her to the operating room soon to have a debridement under anesthesia for the abdominal wall wound, the Achilles tendon on the right leg and amputation of the right fourth toe. The daughter and the patient do not feel that they would be able to undergo hyperbaric oxygen therapy 5 days a week for 6 weeks. 12/17/15; this is a medically complex woman who I note was recently in this clinic however I was not involved with her care. She returns today with multiple wounds; a) she has a wound in the mid abdomen that is been there since March of this year. I note that she is been to the overall for debridement recently. The exact etiology of this wound is not really clear b) left lower quadrant abdominal wound had some sanguinous drainage when she came in here. The patient fell in January and thinks this may have been secondary to a hematoma. c): The patient has 3 wounds on her right leg including a small wound on the right mid calf, a large area over the Achilles which currently has a wound VAC for the last 6 weeks, also a smaller wound on the distal part of the right heel. As far as I  understand most of these wounds are currently been dressed with's calcium alginate. According to her daughter the Achilles wound under the wound VAC is doing well d) the patient is had an amputation of her left fifth toe in January and the right fourth toe 6 weeks ago secondary to diabetic PAD e) the patient has chronic renal failure on dialysis for the last 2 years secondary to type 2 diabetes on  insulin. The daughter's knowledge there is been no biopsy of the abdominal wounds given their current appearance and lack of undefined etiology at have to wonder about calciphylaxis. 12/18/15:Addendum; I [facility] I can see no relevant x-rays of the right heel. I note her arteriogram and revascularization of her right lower extremity in April 2017. She had debridement of both abdominal wounds and the right heel and Achilles wound on 11/07/15 at which time she had a right fourth toe ray amputation. The abdominal wounds were debridement again on 6/29. I do not see any relevant pathology of these abdominal wounds 12/24/15; culture I did of the drainage from the midline abdominal wound last week showed both Proteus and ampicillin sensitive enterococcus. I've given her a course of Augmentin adjusted on dialysis days. She has no specific complaints today. Been using Santyl to the abdominal wounds in the right leg wound and the wound VAC on the right Achilles which was initially prescribed by Dr. dew 12/31/15; I have done two punch biopsies of the large midline abdominal. 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 May have been a trauma component of the one on the left lower quadrant however the midline wound had no such history. She has a large area on the right Achilles heel with a wound VAC prescribed by Dr. dew. A small wound on the right anterior leg.Marland Kitchen UNFORTUNATELY she has 2 new wounds today. One on the left heel which is probably a pressure area. As well her previous amputation site of her right  fourth toe has dehisced and now has a small wound with significant depth at the amputation site. [name], [name] ([id number]) 01/14/2016 -- she returns after 2 weeks and had had a punch biopsy of abdominal wound done the last visit -- had a biopsy of her midline abdominal wound done and the Pathology diagnosis is that of ulceration, necrosis and inflammation and negative for dysplasia and malignancy. 01/21/16. 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 In the meantime she has new wounds of the left heel the right fourth toe amputation site is opened up. The back is stopped to the right heel area. 01/28/16; the abdominal wounds continued to improve. The extensive wound on her right Achilles also looks stable except for the lower aspect of the wound where there is a large liquefied area that probes right down to her calcaneus. This cultured Proteus last week I have her on Augmentin and doxycycline 1. I think this is going to need a course of IV antibiotics and I will call dialysis. X-ray I did last week was negative, I think she is going to need an MRI 02/04/16; right heel MRI as before Saturday. Receiving I believe IV Rocephin at dialysis Electronic Signature(s) Signed: 02/05/2016 7:55:14 AM By: Baltazar Najjar MD Entered By: Baltazar Najjar on 02/04/2016 08:51:45 [name] ([id number]) -------------------------------------------------------------------------------- Physical Exam Details Patient Name: [name] Date of Service: 02/04/2016 8:00 AM Medical Record Patient Account Number: [id number] Number: Treating RN: Huel Coventry Aug 25, 1933 (82 y.o. Other Clinician: Date of Birth/Sex: Female) Treating ROBSON, MICHAEL Primary Care Physician/Extender: Rex Kras, Heidi Physician: Referring Physician: Charolotte Capuchin in Treatment: 7 Constitutional Sitting or standing Blood Pressure is within  target range for patient.. Pulse regular and within target range for patient.Marland Kitchen Respirations regular, non-labored and within target range.. Temperature is normal and within the target range for the patient.. Patient's appearance is neat and clean. Appears in no acute  distress. Well nourished and well developed.. Notes Wound exam; oBoth wounds on the midline of her abdomen and in the left lower quadrant improve each week. Debrided of surface slough/nonviable tissue oRight heel; no improvement from last week still copious amounts of nonviable subcutaneous tissue in the inferior part of the wound also opening superiorly. Both areas debrided. oLeft heel dry scaly area, I am not removing this for now Electronic Signature(s) Signed: 02/05/2016 7:55:14 AM By: Baltazar Najjar MD Entered By: Baltazar Najjar on 02/04/2016 08:54:58 [name] ([id number]) -------------------------------------------------------------------------------- Physician Orders Details Patient Name: [name] Date of Service: 02/04/2016 8:00 AM Medical Record Patient Account Number: [id number] Number: Treating RN: Huel Coventry Feb 05, 1934 (82 y.o. Other Clinician: Date of Birth/Sex: Female) Treating ROBSON, MICHAEL Primary Care Physician/Extender: Rex Kras, Heidi Physician: Referring Physician: Charolotte Capuchin in Treatment: 7 Verbal / Phone Orders: Yes Clinician: Huel Coventry Read Back and Verified: Yes Diagnosis Coding Wound Cleansing Wound #10 Right Calcaneus o Clean wound with Normal Saline. Wound #11 Midline Abdomen - Lower Quadrant o Clean wound with Normal Saline. Wound #12 Left Abdomen - Lower Quadrant o Clean wound with Normal Saline. Wound #13 Left Calcaneus o Clean wound with Normal Saline. Wound #14 Right Amputation Site - Digit o Clean wound with Normal Saline. Wound #9 Right Achilles o Clean wound with Normal Saline. Anesthetic Wound #10 Right Calcaneus o  Topical Lidocaine 4% cream applied to wound bed prior to debridement - in Wound Clinic Wound #11 Midline Abdomen - Lower Quadrant o Topical Lidocaine 4% cream applied to wound bed prior to debridement - in Wound Clinic Wound #12 Left Abdomen - Lower Quadrant o Topical Lidocaine 4% cream applied to wound bed prior to debridement - in Wound Clinic Primary Wound Dressing Wound #10 Right Calcaneus o Aquacel Ag - HHRN to please order his for patient Wound #11 Midline Abdomen - Lower RONNITA, PAZ ([id number]) o Aquacel Ag - HHRN to please order his for patient Wound #12 Left Abdomen - Lower Quadrant o Aquacel Ag - HHRN to please order his for patient Wound #13 Left Calcaneus o Aquacel Ag - HHRN to please order his for patient Wound #14 Right Amputation Site - Digit o Aquacel Ag - HHRN to please order his for patient Wound #9 Right Achilles o Aquacel Ag - HHRN to please order his for patient Secondary Dressing Wound #10 Right Calcaneus o ABD and Kerlix/Conform o ABD and Kerlix/Conform o Dry Gauze - secure with tape o Dry Gauze - secure with tape Wound #11 Midline Abdomen - Lower Quadrant o ABD and Kerlix/Conform o ABD and Kerlix/Conform o Dry Gauze - secure with tape o Dry Gauze - secure with tape Wound #12 Left Abdomen - Lower Quadrant o ABD and Kerlix/Conform o Dry Gauze - secure with tape Wound #13 Left Calcaneus o ABD and Kerlix/Conform o Dry Gauze - secure with tape Wound #14 Right Amputation Site - Digit o ABD and Kerlix/Conform o Dry Gauze - secure with tape Wound #9 Right Achilles o ABD and Kerlix/Conform o Dry Gauze - secure with tape Dressing Change Frequency Wound #10 Right Calcaneus o Change Dressing Monday, Wednesday, Friday - or as needed Wound #11 Midline Abdomen - [address] [name], [name] ([id number]) o Change Dressing Monday, Wednesday, Friday - or as needed Wound #12 Left Abdomen - Lower  Quadrant o Change Dressing Monday, Wednesday, Friday - or as needed Wound #13 Left Calcaneus o Change Dressing Monday, Wednesday, Friday - or as needed Wound #14  Right Amputation Site - Digit o Change Dressing Monday, Wednesday, Friday - or as needed Wound #9 Right Achilles o Change Dressing Monday, Wednesday, Friday - or as needed Follow-up Appointments Wound #10 Right Calcaneus o Return Appointment in 1 week. Wound #11 Midline Abdomen - Lower Quadrant o Return Appointment in 1 week. Wound #12 Left Abdomen - Lower Quadrant o Return Appointment in 1 week. Wound #13 Left Calcaneus o Return Appointment in 1 week. Wound #14 Right Amputation Site - Digit o Return Appointment in 1 week. Wound #9 Right Achilles o Return Appointment in 1 week. Off-Loading Wound #10 Right Calcaneus o Other: - Float heels Wound #13 Left Calcaneus o Other: - Float heels Wound #9 Right Achilles o Other: - Float heels Home Health Wound #10 Right Calcaneus o Continue Home Health Visits - [facility] to order appropriate wound care supplies for patient o Home Health Nurse may visit PRN to address patientos wound care needs. [name], [name] ([id number]) o FACE TO FACE ENCOUNTER: MEDICARE and MEDICAID PATIENTS: I certify that this patient is under my care and that I had a face-to-face encounter that meets the physician face-to-face encounter requirements with this patient on this date. The encounter with the patient was in whole or in part for the following MEDICAL CONDITION: (primary reason for Home Healthcare) MEDICAL NECESSITY: I certify, that based on my findings, NURSING services are a medically necessary home health service. HOME BOUND STATUS: I certify that my clinical findings support that this patient is homebound (i.e., Due to illness or injury, pt requires aid of supportive devices such as crutches, cane, wheelchairs, walkers, the use of special transportation  or the assistance of another person to leave their place of residence. There is a normal inability to leave the home and doing so requires considerable and taxing effort. Other absences are for medical reasons / religious services and are infrequent or of short duration when for other reasons). o If current dressing causes regression in wound condition, may D/C ordered dressing product/s and apply Normal Saline Moist Dressing daily until next Wound Healing Center / Other MD appointment. Notify Wound Healing Center of regression in wound condition at [phone]. o Please direct any NON-WOUND related issues/requests for orders to patient's Primary Care Physician Wound #11 Midline Abdomen - Lower Quadrant o Continue Home Health Visits - [facility] to order appropriate wound care supplies for patient o Home Health Nurse may visit PRN to address patientos wound care needs. o FACE TO FACE ENCOUNTER: MEDICARE and MEDICAID PATIENTS: I certify that this patient is under my care and that I had a face-to-face encounter that meets the physician face-to-face encounter requirements with this patient on this date. The encounter with the patient was in whole or in part for the following MEDICAL CONDITION: (primary reason for Home Healthcare) MEDICAL NECESSITY: I certify, that based on my findings, NURSING services are a medically necessary home health service. HOME BOUND STATUS: I certify that my clinical findings support that this patient is homebound (i.e., Due to illness or injury, pt requires aid of supportive devices such as crutches, cane, wheelchairs, walkers, the use of special transportation or the assistance of another person to leave their place of residence. There is a normal inability to leave the home and doing so requires considerable and taxing effort. Other absences are for medical reasons / religious services and are infrequent or of short duration when for other  reasons). o If current dressing causes regression in wound condition, may  D/C ordered dressing product/s and apply Normal Saline Moist Dressing daily until next Wound Healing Center / Other MD appointment. Notify Wound Healing Center of regression in wound condition at [phone]. o Please direct any NON-WOUND related issues/requests for orders to patient's Primary Care Physician Wound #12 Left Abdomen - Lower Quadrant o Continue Home Health Visits - [facility] to order appropriate wound care supplies for patient o Home Health Nurse may visit PRN to address patientos wound care needs. o FACE TO FACE ENCOUNTER: MEDICARE and MEDICAID PATIENTS: I certify that this patient is under my care and that I had a face-to-face encounter that meets the physician face-to-face encounter requirements with this patient on this date. The encounter with the patient was in whole or in part for the following MEDICAL CONDITION: (primary reason for Home Healthcare) MEDICAL NECESSITY: I certify, that based on my findings, NURSING services are a medically necessary home health service. HOME BOUND STATUS: I certify that my clinical findings AKIA, MONTALBAN ([id number]) support that this patient is homebound (i.e., Due to illness or injury, pt requires aid of supportive devices such as crutches, cane, wheelchairs, walkers, the use of special transportation or the assistance of another person to leave their place of residence. There is a normal inability to leave the home and doing so requires considerable and taxing effort. Other absences are for medical reasons / religious services and are infrequent or of short duration when for other reasons). o If current dressing causes regression in wound condition, may D/C ordered dressing product/s and apply Normal Saline Moist Dressing daily until next Wound Healing Center / Other MD appointment. Notify Wound Healing Center of regression in wound condition  at [phone]. o Please direct any NON-WOUND related issues/requests for orders to patient's Primary Care Physician Wound #13 Left Calcaneus o [facility] Visits - [facility] to order appropriate wound care supplies for patient o Home Health Nurse may visit PRN to address patientos wound care needs. o FACE TO FACE ENCOUNTER: MEDICARE and MEDICAID PATIENTS: I certify that this patient is under my care and that I had a face-to-face encounter that meets the physician face-to-face encounter requirements with this patient on this date. The encounter with the patient was in whole or in part for the following MEDICAL CONDITION: (primary reason for Home Healthcare) MEDICAL NECESSITY: I certify, that based on my findings, NURSING services are a medically necessary home health service. HOME BOUND STATUS: I certify that my clinical findings support that this patient is homebound (i.e., Due to illness or injury, pt requires aid of supportive devices such as crutches, cane, wheelchairs, walkers, the use of special transportation or the assistance of another person to leave their place of residence. There is a normal inability to leave the home and doing so requires considerable and taxing effort. Other absences are for medical reasons / religious services and are infrequent or of short duration when for other reasons). o If current dressing causes regression in wound condition, may D/C ordered dressing product/s and apply Normal Saline Moist Dressing daily until next Wound Healing Center / Other MD appointment. Notify Wound Healing Center of regression in wound condition at [phone]. o Please direct any NON-WOUND related issues/requests for orders to patient's Primary Care Physician Wound #14 Right Amputation Site - Digit o [facility] Visits - [facility] to order appropriate wound care supplies for patient o Home Health Nurse may visit PRN to address  patientos wound care needs. o FACE TO  FACE ENCOUNTER: MEDICARE and MEDICAID PATIENTS: I certify that this patient is under my care and that I had a face-to-face encounter that meets the physician face-to-face encounter requirements with this patient on this date. The encounter with the patient was in whole or in part for the following MEDICAL CONDITION: (primary reason for Home Healthcare) MEDICAL NECESSITY: I certify, that based on my findings, NURSING services are a medically necessary home health service. HOME BOUND STATUS: I certify that my clinical findings support that this patient is homebound (i.e., Due to illness or injury, pt requires aid of supportive devices such as crutches, cane, wheelchairs, walkers, the use of special transportation or the assistance of another person to leave their place of residence. There is a normal inability to leave the home and doing so requires considerable and taxing effort. Other absences are for medical reasons / religious services and are infrequent or of short duration when for other reasons). [name], [name] ([id number]) o If current dressing causes regression in wound condition, may D/C ordered dressing product/s and apply Normal Saline Moist Dressing daily until next Wound Healing Center / Other MD appointment. Notify Wound Healing Center of regression in wound condition at [phone]. o Please direct any NON-WOUND related issues/requests for orders to patient's Primary Care Physician Wound #9 Right Achilles o Continue [facility] Visits - [facility] to order appropriate wound care supplies for patient o Home Health Nurse may visit PRN to address patientos wound care needs. o FACE TO FACE ENCOUNTER: MEDICARE and MEDICAID PATIENTS: I certify that this patient is under my care and that I had a face-to-face encounter that meets the physician face-to-face encounter requirements with this patient on this date. The encounter with  the patient was in whole or in part for the following MEDICAL CONDITION: (primary reason for Home Healthcare) MEDICAL NECESSITY: I certify, that based on my findings, NURSING services are a medically necessary home health service. HOME BOUND STATUS: I certify that my clinical findings support that this patient is homebound (i.e., Due to illness or injury, pt requires aid of supportive devices such as crutches, cane, wheelchairs, walkers, the use of special transportation or the assistance of another person to leave their place of residence. There is a normal inability to leave the home and doing so requires considerable and taxing effort. Other absences are for medical reasons / religious services and are infrequent or of short duration when for other reasons). o If current dressing causes regression in wound condition, may D/C ordered dressing product/s and apply Normal Saline Moist Dressing daily until next Wound Healing Center / Other MD appointment. Notify Wound Healing Center of regression in wound condition at [phone]. o Please direct any NON-WOUND related issues/requests for orders to patient's Primary Care Physician Radiology o Computed Tomography (CT) Scan - right calcaneous with contrast to rule out osteomyelitis Notes MRI Scheduled for Saturday February 08, 2016. Patient has had 3 doses of ABX at dialysis. Electronic Signature(s) Signed: 02/07/2016 4:21:46 PM By: Curtis Sites Signed: 03/06/2016 8:10:29 AM By: Baltazar Najjar MD Previous Signature: 02/05/2016 7:55:14 AM Version By: Baltazar Najjar MD Previous Signature: 02/05/2016 5:55:03 PM Version By: Elliot Gurney RN, BSN, Kim RN, BSN Entered By: Curtis Sites on 02/07/2016 16:21:21 [name], [name] ([id number]) -------------------------------------------------------------------------------- Problem List Details Patient Name: [name], [name]. Date of Service: 02/04/2016 8:00 AM Medical Record Patient Account Number:  [id number] Number: Treating RN: Huel Coventry 12-08-1933 (82 y.o. Other Clinician: Date of Birth/Sex: Female) Treating ROBSON, MICHAEL Primary Care Physician/Extender:  Rex KrasG Grandis, Heidi Physician: Referring Physician: Charolotte CapuchinGrandis, Heidi Weeks in Treatment: 7 Active Problems ICD-10 Encounter Code Description Active Date Diagnosis E11.621 Type 2 diabetes mellitus with foot ulcer 12/17/2015 Yes S31.104A Unspecified open wound of abdominal wall, left lower 12/17/2015 Yes quadrant without penetration into peritoneal cavity, initial encounter E11.51 Type 2 diabetes mellitus with diabetic peripheral 12/17/2015 Yes angiopathy without gangrene Inactive Problems Resolved Problems Electronic Signature(s) Signed: 02/05/2016 7:55:14 AM By: Baltazar Najjarobson, Michael MD Entered By: Baltazar Najjarobson, Michael on 02/04/2016 08:49:40 Natalie RimaHOPKINS, Chere Y. ([id number]) -------------------------------------------------------------------------------- Progress Note Details Patient Name: Natalie RimaHOPKINS, Kia Y. Date of Service: 02/04/2016 8:00 AM Medical Record Patient Account Number: [id number] Number: Treating RN: Huel CoventryWoody, Kim 12-04-1933 (82 y.o. Other Clinician: Date of Birth/Sex: Female) Treating ROBSON, MICHAEL Primary Care Physician/Extender: Rex KrasG Grandis, Heidi Physician: Referring Physician: Charolotte CapuchinGrandis, Heidi Weeks in Treatment: 7 Subjective Chief Complaint Information obtained from Patient Patients presents for treatment of an open diabetic ulcer, arterial ulcer and pressure ulcers to the right lower extremities which is a complex etiology for the last several months. He also has ulcers on her abdomen in the left lower quadrant and left suprapubic area which she's had for about 4 months. 12/17/15; patient returns to clinic apparently referred back from vascular surgery for ongoing wound care. History of Present Illness (HPI) The following HPI elements were documented for the patient's wound: Location: several wounds  on the right lower extremity including her right healed right posterior ankle and right lower third of the leg. She also has wounds on her left lower quadrant of the abdomen and the suprapubic area. Quality: Patient reports experiencing a sharp pain to affected area(s). Severity: Patient states wound are getting worse. Duration: Patient has had the wound for > 3 months prior to seeking treatment at the wound center Timing: Pain in wound is Intermittent (comes and goes Context: The wound appeared gradually over time Modifying Factors: Other treatment(s) tried include:he simply been admitted to the hospital 2 weeks ago and has had procedures done on her right lower extremity and had a blockage which we are trying to get some notes Associated Signs and Symptoms: Patient reports having increase discharge. 82 year old patient who is known to be diabetic, was referred to us by Dr. Gavin PottersGrandis for a right heel ulceration which she's had for a while. She was recently in hospital for a pneumonia and at that time and got delirious and was disoriented and sometime during this time developed a stage II ulcer on her right heel. Her past medical history is significant for bilateral pneumonia which was treated with injectable antibiotics and then to oral Levaquin which he has completed. She also has acute on chronic diastolic CHF, acute on chronic respiratory failure, end-stage renal disease on hemodialysis, atrial fibrillation, recent stroke, diabetes mellitus. The patient and her son are poor historians but from what I understand she was admitted to the hospital with an acute vascular compromise of her right lower extremity and Dr. Wyn Quakerew has done a surgical procedure and we are trying to obtain these notes. There are also some vascular workup done and we will try and obtain these notes. the injury to the left lower quadrant of abdomen and the suprapubic area have been there due to a bruise and have been there  for several months and no intervention has been done. Natalie RimaHOPKINS, Jenniefer Y. ([id number]) 10/11/2015 -- on review of the electronics records it was noted that the patient was admitted to the hospital on 09/14/2015 with peripheral vascular disease with claudication, end-stage renal  disease, pressure ulcer, chronic atrial fibrillation. She was seen by Dr. Wyn Quaker who did her right lower extremity angiogram , angioplasty of the right anterior tibial artery and thrombolysis with TPA of the right popliteal artery, and thrombectomy. She was seen by Dr. Wyn Quaker during this past week and he was pleased with the progress. He did say that if he took her to the operating room for any procedure he would debride the abdominal wound under anesthesia. She was also seen by Dr. Ether Griffins the podiatrist who thought that she may lose her right fourth toe at some stage may need an amputation of this. 10/21/2015 --patient known to Dr. Wyn Quaker and his last office visit from 10/04/2015 has been reviewed. 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 She also had a persistent ulceration of the right heel and markedly this area and a right third and fourth toe and a small scab on the calf but these are dry and seemed to be improving. Patient also has a left carotid endarterectomy and multiple interventions to a right brachiocephalic AV fistula. After the visit he had recommended noninvasive studies to recheck her revascularization. He was off the impression that she would likely lose the right fourth toe and the third toe was likely to heal. He was concerned about underlying muscle necrosis on her right heel and midfoot. 11/01/2015 -- an echo done in January of this year showed her left ventricular ejection fraction to be about 50-55%. The patient was seen by the PA and Dr. Driscilla Grammes office and the plan was to take her to the  operating room soon to have a debridement under anesthesia for the abdominal wall wound, the Achilles tendon on the right leg and amputation of the right fourth toe. The daughter and the patient do not feel that they would be able to undergo hyperbaric oxygen therapy 5 days a week for 6 weeks. 12/17/15; this is a medically complex woman who I note was recently in this clinic however I was not involved with her care. She returns today with multiple wounds; a) she has a wound in the mid abdomen that is been there since March of this year. I note that she is been to the overall for debridement recently. The exact etiology of this wound is not really clear b) left lower quadrant abdominal wound had some sanguinous drainage when she came in here. The patient fell in January and thinks this may have been secondary to a hematoma. c): The patient has 3 wounds on her right leg including a small wound on the right mid calf, a large area over the Achilles which currently has a wound VAC for the last 6 weeks, also a smaller wound on the distal part of the right heel. As far as I understand most of these wounds are currently been dressed with's calcium alginate. According to her daughter the Achilles wound under the wound VAC is doing well d) the patient is had an amputation of her left fifth toe in January and the right fourth toe 6 weeks ago secondary to diabetic PAD e) the patient has chronic renal failure on dialysis for the last 2 years secondary to type 2 diabetes on insulin. The daughter's knowledge there is been no biopsy of the abdominal wounds given their current appearance and lack of undefined etiology at have to wonder about calciphylaxis. 12/18/15:Addendum; I have reviewed [facility] I can see  no relevant x-rays of the right heel. I note her arteriogram and revascularization of her right lower extremity in April 2017. She had debridement of both abdominal wounds and the right heel and  Achilles wound on 11/07/15 at which time she had a right fourth toe ray amputation. The abdominal wounds were debridement again on 6/29. I do not see any LIESL, SIMONS ([id number]) relevant pathology of these abdominal wounds 12/24/15; culture I did of the drainage from the midline abdominal wound last week showed both Proteus and ampicillin sensitive enterococcus. I've given her a course of Augmentin adjusted on dialysis days. She has no specific complaints today. Been using Santyl to the abdominal wounds in the right leg wound and the wound VAC on the right Achilles which was initially prescribed by Dr. dew 12/31/15; I have done two punch biopsies of the large midline abdominal. 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 May have been a trauma component of the one on the left lower quadrant however the midline wound had no such history. She has a large area on the right Achilles heel with a wound VAC prescribed by Dr. dew. A small wound on the right anterior leg.Marland Kitchen UNFORTUNATELY she has 2 new wounds today. One on the left heel which is probably a pressure area. As well her previous amputation site of her right fourth toe has dehisced and now has a small wound with significant depth at the amputation site. 01/14/2016 -- she returns after 2 weeks and had had a punch biopsy of abdominal wound done the last visit -- had a biopsy of her midline abdominal wound done and the Pathology diagnosis is that of ulceration, necrosis and inflammation and negative for dysplasia and malignancy. 01/21/16. 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 In the meantime she has new wounds of the left heel the right fourth toe amputation site is opened up. The back is stopped to the right heel area. 01/28/16; the abdominal wounds continued to improve. The extensive wound on her right Achilles also looks stable except for the lower aspect of the wound where there  is a large liquefied area that probes right down to her calcaneus. This cultured Proteus last week I have her on Augmentin and doxycycline 1. I think this is going to need a course of IV antibiotics and I will call dialysis. X-ray I did last week was negative, I think she is going to need an MRI 02/04/16; right heel MRI as before Saturday. Receiving I believe IV Rocephin at dialysis Objective Constitutional Sitting or standing Blood Pressure is within target range for patient.. Pulse regular and within target range for patient.Marland Kitchen Respirations regular, non-labored and within target range.. Temperature is normal and within the target range for the patient.. Patient's appearance is neat and clean. Appears in no acute distress. Well nourished and well developed.. Vitals Time Taken: 8:07 AM, Height: 65 in, Weight: 160 lbs, BMI: 26.6, Temperature: 98.2 F, Pulse: 80 bpm, Respiratory Rate: 18 breaths/min, Blood Pressure: 113/62 mmHg. General Notes: Wound exam; Both wounds on the midline of her abdomen and in the left lower quadrant improve each week. Debrided of surface slough/nonviable tissue Right heel; no improvement from last week still copious amounts of nonviable subcutaneous tissue in the inferior part of the wound also opening superiorly. Both areas debrided. Left heel dry scaly area, I am not removing this for now Integumentary (Hair, Skin) Wound #10 status is Open. Original cause of wound was  Gradually Appeared. The wound is located on the Right Calcaneus. The wound measures 1.4cm length x 1.5cm width x 0.1cm depth; 1.649cm^2 area and JAZELL, ROSENAU. ([id number]) 0.165cm^3 volume. The wound is limited to skin breakdown. There is no tunneling or undermining noted. There is a medium amount of serous drainage noted. The wound margin is flat and intact. There is medium (34-66%) pink granulation within the wound bed. There is a medium (34-66%) amount of necrotic tissue within the wound bed  including Eschar. The periwound skin appearance exhibited: Dry/Scaly, Erythema. The periwound skin appearance did not exhibit: Callus, Crepitus, Excoriation, Fluctuance, Friable, Induration, Localized Edema, Rash, Scarring, Maceration, Moist, Atrophie Blanche, Cyanosis, Ecchymosis, Hemosiderin Staining, Mottled, Pallor, Rubor. The surrounding wound skin color is noted with erythema which is circumferential. Periwound temperature was noted as No Abnormality. The periwound has tenderness on palpation. Wound #11 status is Open. Original cause of wound was Trauma. The wound is located on the Midline Abdomen - Lower Quadrant. The wound measures 2.5cm length x 5.5cm width x 0.8cm depth; 10.799cm^2 area and 8.639cm^3 volume. The wound is limited to skin breakdown. There is no tunneling or undermining noted. There is a large amount of serous drainage noted. The wound margin is flat and intact. There is large (67-100%) red granulation within the wound bed. There is a small (1-33%) amount of necrotic tissue within the wound bed including Eschar and Adherent Slough. The periwound skin appearance exhibited: Moist, Erythema. The periwound skin appearance did not exhibit: Callus, Crepitus, Excoriation, Fluctuance, Friable, Induration, Localized Edema, Rash, Scarring, Dry/Scaly, Maceration, Atrophie Blanche, Cyanosis, Ecchymosis, Hemosiderin Staining, Mottled, Pallor, Rubor. The surrounding wound skin color is noted with erythema which is circumferential. Periwound temperature was noted as No Abnormality. The periwound has tenderness on palpation. Wound #12 status is Open. Original cause of wound was Trauma. The wound is located on the Left Abdomen - Lower Quadrant. The wound measures 1cm length x 6.5cm width x 0.7cm depth; 5.105cm^2 area and 3.574cm^3 volume. The wound is limited to skin breakdown. There is no tunneling or undermining noted. There is a large amount of serous drainage noted. The wound margin is  flat and intact. There is large (67-100%) red granulation within the wound bed. There is a small (1-33%) amount of necrotic tissue within the wound bed including Adherent Slough. The periwound skin appearance exhibited: Moist, Erythema. The periwound skin appearance did not exhibit: Callus, Crepitus, Excoriation, Fluctuance, Friable, Induration, Localized Edema, Rash, Scarring, Dry/Scaly, Maceration, Atrophie Blanche, Cyanosis, Ecchymosis, Hemosiderin Staining, Mottled, Pallor, Rubor. The surrounding wound skin color is noted with erythema which is circumferential. Periwound temperature was noted as No Abnormality. The periwound has tenderness on palpation. Wound #13 status is Open. Original cause of wound was Gradually Appeared. The wound is located on the Left Calcaneus. The wound measures 0.8cm length x 0.4cm width x 0.1cm depth; 0.251cm^2 area and 0.025cm^3 volume. The wound is limited to skin breakdown. There is no tunneling or undermining noted. There is a medium amount of serosanguineous drainage noted. The wound margin is flat and intact. There is no granulation within the wound bed. There is a large (67-100%) amount of necrotic tissue within the wound bed including Eschar. The periwound skin appearance exhibited: Moist. The periwound skin appearance did not exhibit: Callus, Crepitus, Excoriation, Fluctuance, Friable, Induration, Localized Edema, Rash, Scarring, Dry/Scaly, Maceration, Atrophie Blanche, Cyanosis, Ecchymosis, Hemosiderin Staining, Mottled, Pallor, Rubor, Erythema. Periwound temperature was noted as No Abnormality. Wound #14 status is Open. Original cause of wound was Surgical Injury.  The wound is located on the Right Amputation Site - Digit. The wound measures 0.5cm length x 0.7cm width x 0.1cm depth; 0.275cm^2 area and 0.027cm^3 volume. The wound is limited to skin breakdown. There is no tunneling or undermining noted. There is a medium amount of serous drainage noted. The  wound margin is flat and intact. There is no granulation within the wound bed. There is a large (67-100%) amount of necrotic tissue within the wound bed including Eschar and Adherent Slough. The periwound skin appearance exhibited: Maceration, Moist. The periwound skin appearance did not exhibit: Callus, Crepitus, Excoriation, Fluctuance, EARLISHA, SHARPLES ([id number]) Induration, Localized Edema, Rash, Scarring, Dry/Scaly, Atrophie Blanche, Cyanosis, Ecchymosis, Hemosiderin Staining, Mottled, Pallor, Rubor, Erythema. Periwound temperature was noted as No Abnormality. Wound #9 status is Open. Original cause of wound was Gradually Appeared. The wound is located on the Right Achilles. The wound measures 11cm length x 4cm width x 0.4cm depth; 34.558cm^2 area and 13.823cm^3 volume. There is tendon exposed. There is a large amount of serosanguineous drainage noted. The wound margin is thickened. There is large (67-100%) red granulation within the wound bed. There is a small (1-33%) amount of necrotic tissue within the wound bed including Eschar and Adherent Slough. The periwound skin appearance exhibited: Maceration, Moist, Erythema. The periwound skin appearance did not exhibit: Callus, Crepitus, Excoriation, Fluctuance, Friable, Induration, Localized Edema, Rash, Scarring, Dry/Scaly, Atrophie Blanche, Cyanosis, Ecchymosis, Hemosiderin Staining, Mottled, Pallor, Rubor. The surrounding wound skin color is noted with erythema which is circumferential. The periwound has tenderness on palpation. 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 There was a Skin/Subcutaneous Tissue Debridement ([id number]) debridement with total area of 13.75 sq cm performed by Maxwell Caul, MD. with the following instrument(s): Curette to remove Viable and Non-Viable tissue/material including Exudate, Fibrin/Slough, and Subcutaneous after achieving pain control using Other (lidocaine 4%). A time out was conducted at 08:40, prior to the start of the procedure. A Minimum amount of bleeding was controlled with Pressure. The procedure was tolerated well with a pain level of 0 throughout and a pain level of 0 following the procedure. Post Debridement Measurements: 2.5cm length x 5.5cm width x 0.8cm depth; 8.639cm^3 volume. Character of Wound/Ulcer Post Debridement is improved. Severity of Tissue Post Debridement is: Limited to breakdown of skin. 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 There was a Skin/Subcutaneous Tissue Debridement ([id number]) debridement with total area of 6.5 sq cm performed by Maxwell Caul, MD. with the following instrument(s): Curette to remove Viable and Non-Viable tissue/material including Exudate, Fibrin/Slough, and Subcutaneous after achieving pain control using Other (lidocaine 4%). A time out was conducted at 08:40, prior to the start of the procedure. A Minimum amount of bleeding was controlled with Pressure. The procedure was tolerated well with a pain level of 0 throughout and a pain level of 0 following the procedure. Post Debridement Measurements: 1cm length x 6.5cm width x 0.7cm depth; 3.574cm^3 volume. Character of Wound/Ulcer Post Debridement is improved. Severity of Tissue Post Debridement is: Limited to breakdown of skin. Post procedure Diagnosis Wound #12: Same  as Pre-Procedure Wound #9 Wound #9 is a Diabetic Wound/Ulcer of the Lower Extremity located on the  Right Achilles . There was a Skin/Subcutaneous Tissue Debridement ([id number]) debridement with total area of 44 sq cm performed by Maxwell Caul, MD. with the following instrument(s): Blade and Forceps to remove Viable and Non-Viable tissue/material including Exudate, Fat, Fibrin/Slough, Eschar, Tendon, Skin, and Subcutaneous after achieving pain control using Other (lidocaine 4%). A time out was conducted at 08:40, prior to the start of the procedure. A Minimum amount of bleeding was controlled with Pressure. The procedure was tolerated well with a pain level of 0 throughout and a pain level of 0 following the procedure. Post Debridement Measurements: 11cm length x 4cm width x 0.4cm depth; 13.823cm^3 volume. Character of Wound/Ulcer Post Debridement requires further debridement. Severity of Tissue Post Debridement is: Fat layer exposed. Post procedure Diagnosis Wound #9: Same as Pre-Procedure Plan Wound Cleansing: Wound #10 Right Calcaneus: Clean wound with Normal Saline. Wound #11 Midline Abdomen - Lower Quadrant: Clean wound with Normal Saline. Wound #12 Left Abdomen - Lower Quadrant: Clean wound with Normal Saline. Wound #13 Left Calcaneus: Clean wound with Normal Saline. Wound #14 Right Amputation Site - Digit: Clean wound with Normal Saline. Wound #9 Right Achilles: Clean wound with Normal Saline. Anesthetic: Wound #10 Right Calcaneus: Topical Lidocaine 4% cream applied to wound bed prior to debridement - in Wound Clinic Wound #11 Midline Abdomen - Lower Quadrant: SAMARIA, ANES ([id number]) Topical Lidocaine 4% cream applied to wound bed prior to debridement - in Wound Clinic Wound #12 Left Abdomen - Lower Quadrant: Topical Lidocaine 4% cream applied to wound bed prior to debridement - in Wound Clinic Primary Wound Dressing: Wound #10 Right Calcaneus: Aquacel Ag - HHRN to please order his for patient Wound #11 Midline Abdomen - Lower Quadrant: Aquacel Ag - HHRN to  please order his for patient Wound #12 Left Abdomen - Lower Quadrant: Aquacel Ag - HHRN to please order his for patient Wound #13 Left Calcaneus: Aquacel Ag - HHRN to please order his for patient Wound #14 Right Amputation Site - Digit: Aquacel Ag - HHRN to please order his for patient Wound #9 Right Achilles: Aquacel Ag - HHRN to please order his for patient Secondary Dressing: Wound #10 Right Calcaneus: ABD and Kerlix/Conform Dry Gauze - secure with tape ABD and Kerlix/Conform Dry Gauze - secure with tape Wound #11 Midline Abdomen - Lower Quadrant: ABD and Kerlix/Conform Dry Gauze - secure with tape ABD and Kerlix/Conform Dry Gauze - secure with tape Wound #12 Left Abdomen - Lower Quadrant: ABD and Kerlix/Conform Dry Gauze - secure with tape Wound #13 Left Calcaneus: ABD and Kerlix/Conform Dry Gauze - secure with tape Wound #14 Right Amputation Site - Digit: ABD and Kerlix/Conform Dry Gauze - secure with tape Wound #9 Right Achilles: ABD and Kerlix/Conform Dry Gauze - secure with tape Dressing Change Frequency: Wound #10 Right Calcaneus: Change Dressing Monday, Wednesday, Friday - or as needed Wound #11 Midline Abdomen - Lower Quadrant: Change Dressing Monday, Wednesday, Friday - or as needed Wound #12 Left Abdomen - Lower Quadrant: Change Dressing Monday, Wednesday, Friday - or as needed Wound #13 Left Calcaneus: Change Dressing Monday, Wednesday, Friday - or as needed Wound #14 Right Amputation Site - Digit: Change Dressing Monday, Wednesday, Friday - or as needed Wound #9 Right Achilles: VERNEICE, CASPERS ([id number]) Change Dressing Monday, Wednesday, Friday - or as needed Follow-up Appointments: Wound #10 Right Calcaneus: Return Appointment in 1 week. Wound #11 Midline  Abdomen - Lower Quadrant: Return Appointment in 1 week. Wound #12 Left Abdomen - Lower Quadrant: Return Appointment in 1 week. Wound #13 Left Calcaneus: Return Appointment in 1 week. Wound  #14 Right Amputation Site - Digit: Return Appointment in 1 week. Wound #9 Right Achilles: Return Appointment in 1 week. Off-Loading: Wound #10 Right Calcaneus: Other: - Float heels Wound #13 Left Calcaneus: Other: - Float heels Wound #9 Right Achilles: Other: - Float heels Home Health: Wound #10 Right Calcaneus: [facility] Vis[facility] - [facility] to order appropriate wound care supplies for patient Home Health Nurse may visit PRN to address patient s wound care needs. FACE TO FACE ENCOUNTER: MEDICARE and MEDICAID PATIENTS: I certify that this patient is under my care and that I had a face-to-face encounter that meets the physician face-to-face encounter requirements with this patient on this date. The encounter with the patient was in whole or in part for the following MEDICAL CONDITION: (primary reason for Home Healthcare) MEDICAL NECESSITY: I certify, that based on my findings, NURSING services are a medically necessary home health service. HOME BOUND STATUS: I certify that my clinical findings support that this patient is homebound (i.e., Due to illness or injury, pt requires aid of supportive devices such as crutches, cane, wheelchairs, walkers, the use of special transportation or the assistance of another person to leave their place of residence. There is a normal inability to leave the home and doing so requires considerable and taxing effort. Other absences are for medical reasons / religious services and are infrequent or of short duration when for other reasons). If current dressing causes regression in wound condition, may D/C ordered dressing product/s and apply Normal Saline Moist Dressing daily until next Wound Healing Center / Other MD appointment. Notify Wound Healing Center of regression in wound condition at [phone]. Please direct any NON-WOUND related issues/requests for orders to patient's Primary Care Physician Wound #11 Midline Abdomen - Lower  Quadrant: Continue [facility] Visits - [facility] to order appropriate wound care supplies for patient Home Health Nurse may visit PRN to address patient s wound care needs. FACE TO FACE ENCOUNTER: MEDICARE and MEDICAID PATIENTS: I certify that this patient is under my care and that I had a face-to-face encounter that meets the physician face-to-face encounter requirements with this patient on this date. The encounter with the patient was in whole or in part for the following MEDICAL CONDITION: (primary reason for Home Healthcare) MEDICAL NECESSITY: I certify, that based on my findings, NURSING services are a medically necessary home health service. HOME BOUND STATUS: I certify that my clinical findings support that this patient is homebound (i.e., Due to illness or injury, pt requires aid of supportive devices such as crutches, cane, wheelchairs, walkers, the use of special transportation or the assistance of another person to leave their place of residence. There is a normal inability to leave the home and doing so requires considerable and taxing effort. Other absences are SHAHIRA, FISKE ([id number]) for medical reasons / religious services and are infrequent or of short duration when for other reasons). If current dressing causes regression in wound condition, may D/C ordered dressing product/s and apply Normal Saline Moist Dressing daily until next Wound Healing Center / Other MD appointment. Notify Wound Healing Center of regression in wound condition at [phone]. Please direct any NON-WOUND related issues/requests for orders to patient's Primary Care Physician Wound #12 Left Abdomen - Lower Quadrant: Continue Home Health Visits - [facility] - [facility]  to order appropriate wound care supplies for patient Home Health Nurse may visit PRN to address patient s wound care needs. FACE TO FACE ENCOUNTER: MEDICARE and MEDICAID PATIENTS: I certify that this patient is under my care and that  I had a face-to-face encounter that meets the physician face-to-face encounter requirements with this patient on this date. The encounter with the patient was in whole or in part for the following MEDICAL CONDITION: (primary reason for Home Healthcare) MEDICAL NECESSITY: I certify, that based on my findings, NURSING services are a medically necessary home health service. HOME BOUND STATUS: I certify that my clinical findings support that this patient is homebound (i.e., Due to illness or injury, pt requires aid of supportive devices such as crutches, cane, wheelchairs, walkers, the use of special transportation or the assistance of another person to leave their place of residence. There is a normal inability to leave the home and doing so requires considerable and taxing effort. Other absences are for medical reasons / religious services and are infrequent or of short duration when for other reasons). If current dressing causes regression in wound condition, may D/C ordered dressing product/s and apply Normal Saline Moist Dressing daily until next Wound Healing Center / Other MD appointment. Notify Wound Healing Center of regression in wound condition at [phone]. Please direct any NON-WOUND related issues/requests for orders to patient's Primary Care Physician Wound #13 Left Calcaneus: Continue [facility] Visits - [facility] to order appropriate wound care supplies for patient Home Health Nurse may visit PRN to address patient s wound care needs. FACE TO FACE ENCOUNTER: MEDICARE and MEDICAID PATIENTS: I certify that this patient is under my care and that I had a face-to-face encounter that meets the physician face-to-face encounter requirements with this patient on this date. The encounter with the patient was in whole or in part for the following MEDICAL CONDITION: (primary reason for Home Healthcare) MEDICAL NECESSITY: I certify, that based on my findings, NURSING services are a  medically necessary home health service. HOME BOUND STATUS: I certify that my clinical findings support that this patient is homebound (i.e., Due to illness or injury, pt requires aid of supportive devices such as crutches, cane, wheelchairs, walkers, the use of special transportation or the assistance of another person to leave their place of residence. There is a normal inability to leave the home and doing so requires considerable and taxing effort. Other absences are for medical reasons / religious services and are infrequent or of short duration when for other reasons). If current dressing causes regression in wound condition, may D/C ordered dressing product/s and apply Normal Saline Moist Dressing daily until next Wound Healing Center / Other MD appointment. Notify Wound Healing Center of regression in wound condition at [phone]. Please direct any NON-WOUND related issues/requests for orders to patient's Primary Care Physician Wound #14 Right Amputation Site - Digit: Continue [facility] Visits - [facility] to order appropriate wound care supplies for patient Home Health Nurse may visit PRN to address patient s wound care needs. FACE TO FACE ENCOUNTER: MEDICARE and MEDICAID PATIENTS: I certify that this patient is under my care and that I had a face-to-face encounter that meets the physician face-to-face encounter requirements with this patient on this date. The encounter with the patient was in whole or in part for the following MEDICAL CONDITION: (primary reason for Home Healthcare) MEDICAL NECESSITY: I certify, that based on my findings, NURSING services are a medically necessary home health service.  HOME BOUND STATUS: I certify that my clinical findings support that this patient is homebound (i.e., Due to illness or injury, pt requires aid of supportive devices such as crutches, cane, wheelchairs, walkers, the use of special transportation or the assistance of another person  to leave their place of residence. There is a normal inability to leave the home and doing so requires considerable and taxing effort. Other absences are NYASIA, BAXLEY ([id number]) for medical reasons / religious services and are infrequent or of short duration when for other reasons). If current dressing causes regression in wound condition, may D/C ordered dressing product/s and apply Normal Saline Moist Dressing daily until next Wound Healing Center / Other MD appointment. Notify Wound Healing Center of regression in wound condition at [phone]. Please direct any NON-WOUND related issues/requests for orders to patient's Primary Care Physician Wound #9 Right Achilles: Continue [facility] Visits - [facility] to order appropriate wound care supplies for patient Home Health Nurse may visit PRN to address patient s wound care needs. FACE TO FACE ENCOUNTER: MEDICARE and MEDICAID PATIENTS: I certify that this patient is under my care and that I had a face-to-face encounter that meets the physician face-to-face encounter requirements with this patient on this date. The encounter with the patient was in whole or in part for the following MEDICAL CONDITION: (primary reason for Home Healthcare) MEDICAL NECESSITY: I certify, that based on my findings, NURSING services are a medically necessary home health service. HOME BOUND STATUS: I certify that my clinical findings support that this patient is homebound (i.e., Due to illness or injury, pt requires aid of supportive devices such as crutches, cane, wheelchairs, walkers, the use of special transportation or the assistance of another person to leave their place of residence. There is a normal inability to leave the home and doing so requires considerable and taxing effort. Other absences are for medical reasons / religious services and are infrequent or of short duration when for other reasons). If current dressing causes regression in wound  condition, may D/C ordered dressing product/s and apply Normal Saline Moist Dressing daily until next Wound Healing Center / Other MD appointment. Notify Wound Healing Center of regression in wound condition at [phone]. Please direct any NON-WOUND related issues/requests for orders to patient's Primary Care Physician General Notes: MRI Scheduled for Saturday February 08, 2016. Patient has had 3 doses of ABX at dialysis. #1 there is no reason to change any of the current dressing orders which are all Aquacel Ag base #2 daughter complaining about punctuality of [facility] nurse [home health] #3 MRI on Saturday of the right heel should dictate further antibiotics and consultations Electronic Signature(s) Signed: 02/05/2016 7:55:14 AM By: Baltazar Najjar MD Entered By: Baltazar Najjar on 02/04/2016 08:56:34 [name] ([id number]) -------------------------------------------------------------------------------- SuperBill Details Patient Name: [name] Date of Service: 02/04/2016 Medical Record Patient Account Number: [id number] Number: Treating RN: Huel Coventry [birth year] (82 y.o. Other Clinician: Date of Birth/Sex: Female) Treating ROBSON, MICHAEL Primary Care Physician/Extender: Rex Kras, Heidi Physician: Weeks in Treatment: 7 Referring Physician: [name] Diagnosis Coding ICD-10 Codes Code Description E11.621 Type 2 diabetes mellitus with foot ulcer Unspecified open wound of abdominal wall, left lower quadrant without penetration into S31.104A peritoneal cavity, initial encounter E11.51 Type 2 diabetes mellitus with diabetic peripheral angiopathy without gangrene Facility Procedures CPT4: Description Modifier Quantity Code 95284132 11042 - DEB SUBQ TISSUE 20 SQ CM/< 1 ICD-10 Description Diagnosis E11.621 Type 2 diabetes mellitus with foot ulcer CPT4:  [id number] 11045 - DEB SUBQ TISS EA ADDL 20CM 3 ICD-10 Description Diagnosis S31.104A Unspecified open  wound of abdominal wall, left lower quadrant without penetration into peritoneal cavity, initial encounter Physician Procedures CPT4: Description Modifier Quantity Code [id number] 11042 - WC PHYS SUBQ TISS 20 SQ CM 1 ICD-10 Description Diagnosis E11.621 Type 2 diabetes mellitus with foot ulcer CPT4: [id number] 11045 - WC PHYS SUBQ TISS EA ADDL 20 CM 3 ICD-10 Description Diagnosis S31.104A ANNISTON, NELLUMS ([id number]) Electronic Signature(s) Signed: 02/05/2016 7:55:14 AM By: Baltazar Najjar MD Entered By: Baltazar Najjar on 02/04/2016 08:57:14

## 2016-02-08 ENCOUNTER — Ambulatory Visit: Payer: Medicare Other

## 2016-02-11 ENCOUNTER — Other Ambulatory Visit
Admission: RE | Admit: 2016-02-11 | Discharge: 2016-02-11 | Disposition: A | Discharge status: Home / Self Care | Payer: Medicare Other | Source: Ambulatory Visit | Attending: Internal Medicine | Admitting: Internal Medicine

## 2016-02-11 ENCOUNTER — Encounter: Payer: Medicare Other | Admitting: Internal Medicine

## 2016-02-11 ENCOUNTER — Other Ambulatory Visit: Payer: Self-pay | Admitting: Internal Medicine

## 2016-02-11 DIAGNOSIS — Z794 Long term (current) use of insulin: Principal | ICD-10-CM

## 2016-02-11 DIAGNOSIS — E11621 Type 2 diabetes mellitus with foot ulcer: Secondary | ICD-10-CM

## 2016-02-11 DIAGNOSIS — S91001A Unspecified open wound, right ankle, initial encounter: Secondary | ICD-10-CM | POA: Insufficient documentation

## 2016-02-11 DIAGNOSIS — X58XXXA Exposure to other specified factors, initial encounter: Secondary | ICD-10-CM | POA: Insufficient documentation

## 2016-02-11 DIAGNOSIS — E1151 Type 2 diabetes mellitus with diabetic peripheral angiopathy without gangrene: Secondary | ICD-10-CM

## 2016-02-11 DIAGNOSIS — L97509 Non-pressure chronic ulcer of other part of unspecified foot with unspecified severity: Principal | ICD-10-CM

## 2016-02-11 NOTE — ED Provider Notes (Signed)
[facility] Emergency Department Provider Note   ____________________________________________   I have reviewed the triage vital signs and the nursing notes.   HISTORY  Chief Complaint Fall   History limited by: Not Limited   HPI [name] is a 82 y.o. female who presents to the emergency department today after a fall. The patient had a mechanical fall onto concrete. States that she did hit her head but did not lose consciousness. Has since had a headache. The patient also complains of injury to her left elbow. Not having any difficulty with movement of the upper extremities. The patient did go to dialysis after the fall. Apparently presents to the emergency department today because of dialysis staff concerns. The patient denies any recent illness, fevers or chest pain.   Past Medical History:  Diagnosis Date  . Anemia   . Arthritis    gout  . Atrial fibrillation (HCC)   . CHF (congestive heart failure) (HCC)   . Chronic kidney disease   . Diabetes mellitus without complication (HCC)   . Dialysis patient (HCC)   . Dysrhythmia   . GERD (gastroesophageal reflux disease)   . Hypertension   . Peripheral vascular disease (HCC)   . Pleural effusion   . Pulmonary hypertension (HCC)   . Renal insufficiency   . Restless leg syndrome   . Shortness of breath dyspnea    with exertion  . Stroke [facility])     Patient Active Problem List   Diagnosis Date Noted  . Hematoma 10/12/2015  . Leg pain, right 09/14/2015  . PVD (peripheral vascular disease) with claudication (HCC) 09/14/2015  . Pneumonia 08/13/2015  . Carotid stenosis 07/26/2015  . Amputation of fifth toe, left, traumatic (HCC) 06/24/2015  . Diabetes mellitus (HCC) 06/23/2015  . Diabetic neuropathy (HCC) 06/23/2015  . Chronic atrial fibrillation (HCC) 06/23/2015  . Left carotid artery stenosis 06/23/2015  . CVA (cerebral infarction) 06/21/2015  . Pressure ulcer 06/19/2015  . End stage renal  disease (HCC) 06/18/2015  . Respiratory distress 12/11/2014    Past Surgical History:  Procedure Laterality Date  . AMPUTATION Right 11/07/2015   Procedure: AMPUTATION DIGIT ( 4TH TOE, RIGHT FOOT );  Surgeon: Annice Needy, MD;  Location: [facility] ORS;  Service: Vascular;  Laterality: Right;  . AMPUTATION TOE Left 06/07/2015   Procedure: AMPUTATION TOE;  Surgeon: Gwyneth Revels, DPM;  Location: [facility] ORS;  Service: Podiatry;  Laterality: Left;  . CARDIAC CATHETERIZATION    . CATARACT EXTRACTION, BILATERAL    . CHOLECYSTECTOMY    . ENDARTERECTOMY Left 07/26/2015   Procedure: ENDARTERECTOMY CAROTID;  Surgeon: Renford Dills, MD;  Location: [facility] ORS;  Service: Vascular;  Laterality: Left;  . EYE SURGERY    . FISTULAGRAM ([facility] HX)    . JOINT REPLACEMENT     bilateral hip  . MEDTRONIC BLADDER INTERSTEM     currently turned off for MRI in January  . PARATHYROIDECTOMY    . PERIPHERAL VASCULAR CATHETERIZATION N/A 10/08/2014   Procedure: A/V Shuntogram/Fistulagram;  Surgeon: Annice Needy, MD;  Location: [facility] INVASIVE CV LAB;  Service: Cardiovascular;  Laterality: N/A;  . PERIPHERAL VASCULAR CATHETERIZATION N/A 03/04/2015   Procedure: A/V Shuntogram/Fistulagram;  Surgeon: Annice Needy, MD;  Location: [facility] INVASIVE CV LAB;  Service: Cardiovascular;  Laterality: N/A;  . PERIPHERAL VASCULAR CATHETERIZATION N/A 03/04/2015   Procedure: A/V Shunt Intervention;  Surgeon: Annice Needy, MD;  Location: [facility] INVASIVE CV LAB;  Service: Cardiovascular;  Laterality: N/A;  . PERIPHERAL  VASCULAR CATHETERIZATION N/A 09/16/2015   Procedure: Lower Extremity Angiography;  Surgeon: Annice NeedyJason S Dew, MD;  Location: [facility] INVASIVE CV LAB;  Service: Cardiovascular;  Laterality: N/A;  . PERIPHERAL VASCULAR CATHETERIZATION  09/16/2015   Procedure: Lower Extremity Intervention;  Surgeon: Annice NeedyJason S Dew, MD;  Location: [facility] INVASIVE CV LAB;  Service: Cardiovascular;;  . THYROIDECTOMY, PARTIAL    . TONSILLECTOMY    . WOUND DEBRIDEMENT Right  11/07/2015   Procedure: DEBRIDEMENT WOUND ( RIGHT HEEL AND ABDOMINAL WOUND (2) );  Surgeon: Annice NeedyJason S Dew, MD;  Location: [facility] ORS;  Service: Vascular;  Laterality: Right;  . WOUND DEBRIDEMENT N/A 11/28/2015   Procedure: DEBRIDEMENT WOUND ( ABDOMINAL );  Surgeon: Annice NeedyJason S Dew, MD;  Location: [facility] ORS;  Service: Vascular;  Laterality: N/A;    Prior to Admission medications   Medication Sig Start Date End Date Taking? Authorizing Provider  acetaminophen (TYLENOL) 650 MG CR tablet Take 1,300 mg by mouth 2 (two) times daily.    Historical Provider, MD  albuterol (PROVENTIL HFA;VENTOLIN HFA) 108 (90 Base) MCG/ACT inhaler Inhale 1-2 puffs into the lungs every 6 (six) hours as needed for wheezing or shortness of breath.    Historical Provider, MD  Biotin 1 MG CAPS Take 1 mg by mouth daily.     Historical Provider, MD  bisacodyl (DULCOLAX) 5 MG EC tablet Take 10 mg by mouth daily as needed for moderate constipation.     Historical Provider, MD  calcium acetate, Phos Binder, (PHOSLYRA) 667 MG/5ML SOLN Take 1,334 mg by mouth 3 (three) times daily with meals.    Historical Provider, MD  cephALEXin (KEFLEX) 500 MG capsule Take 1 capsule (500 mg total) by mouth 3 (three) times daily. 11/19/15   Irean HongJade J Sung, MD  diltiazem (TIAZAC) 120 MG 24 hr capsule Take 120 mg by mouth every morning.     Historical Provider, MD  diphenhydrAMINE (BENADRYL) 25 mg capsule Take 1 capsule (25 mg total) by mouth every 6 (six) hours as needed (rash, itching). 06/24/15   Katharina Caperima Vaickute, MD  folic acid-vitamin b complex-vitamin c-selenium-zinc (DIALYVITE) 3 MG TABS tablet Take 1 tablet by mouth daily.    Historical Provider, MD  furosemide (LASIX) 80 MG tablet Take 80 mg by mouth 2 (two) times daily.    Historical Provider, MD  gabapentin (NEURONTIN) 100 MG capsule Take 100 mg by mouth at bedtime. Pt takes on Sunday,  Tuesday  ,Thursday and Saturday    Historical Provider, MD  gabapentin (NEURONTIN) 300 MG capsule Take 300 mg by mouth daily.  Pt takes on  Monday, Wednesday, and Friday after dialysis.    Historical Provider, MD  insulin detemir (LEVEMIR) 100 UNIT/ML injection Inject 0.11 mLs (11 Units total) into the skin daily. Patient taking differently: Inject 21 Units into the skin daily. Divided dose 11 units in am, 10 units in pm 10/15/15   Katha HammingSnehalatha Konidena, MD  lidocaine-prilocaine (EMLA) cream Apply 1 application topically as needed (prior to accessing port).     Historical Provider, MD  nystatin (MYCOSTATIN/NYSTOP) 100000 UNIT/GM POWD Apply topically 2 (two) times daily as needed.    Historical Provider, MD  Omega-3 Fatty Acids (FISH OIL) 1200 MG CAPS Take 1,200 mg by mouth daily.    Historical Provider, MD  PARoxetine (PAXIL) 40 MG tablet Take 40 mg by mouth at bedtime.     Historical Provider, MD  pravastatin (PRAVACHOL) 20 MG tablet Take 1 tablet (20 mg total) by mouth daily. Patient taking differently: Take 20 mg by  mouth at bedtime.  07/27/15   Kimberly A Stegmayer, PA-C  ranitidine (ZANTAC) 150 MG tablet Take 150 mg by mouth daily.     Historical Provider, MD  senna-docusate (SENOKOT-S) 8.6-50 MG tablet Take 1 tablet by mouth 2 (two) times daily as needed for mild constipation or moderate constipation.     Historical Provider, MD  traMADol (ULTRAM) 50 MG tablet Take 1 tablet (50 mg total) by mouth every 6 (six) hours as needed for moderate pain. 09/18/15   Milagros Loll, MD  traMADol (ULTRAM) 50 MG tablet Take 1 tablet (50 mg total) by mouth every 6 (six) hours as needed. 11/07/15   Annice Needy, MD  traMADol (ULTRAM) 50 MG tablet Take 1 tablet (50 mg total) by mouth every 6 (six) hours as needed. 11/28/15   Annice Needy, MD  warfarin (COUMADIN) 2 MG tablet Take 1 tablet (2 mg total) by mouth daily. 10/15/15   Katha Hamming, MD    Allergies Codeine; Contrast media [iodinated diagnostic agents]; Oxycodone; and Quinine derivatives  Family History  Problem Relation Age of Onset  . Colon cancer Mother   . Stroke Father    . Diabetes Brother     Social History Social History  Substance Use Topics  . Smoking status: Never Smoker  . Smokeless tobacco: Never Used  . Alcohol use No    Review of Systems  Constitutional: Negative for fever. Cardiovascular: Negative for chest pain. Respiratory: Negative for shortness of breath. Gastrointestinal: Negative for abdominal pain, vomiting and diarrhea. Genitourinary: Negative for dysuria. Musculoskeletal: Negative for back pain. Skin: Positive for left elbow skin tear. Neurological: Positive for headache.  10-point ROS otherwise negative.  ____________________________________________   PHYSICAL EXAM:  VITAL SIGNS: ED Triage Vitals  Enc Vitals Group     BP 02/05/16 1729 126/60     Pulse Rate 02/05/16 1729 88     Resp 02/05/16 1729 18     Temp 02/05/16 1729 97.7 F (36.5 C)     Temp Source 02/05/16 1729 Oral     SpO2 02/05/16 1729 100 %     Weight 02/05/16 1730 160 lb (72.6 kg)     Height 02/05/16 1730 5\' 5"  (1.651 m)     Head Circumference --      Peak Flow --      Pain Score 02/05/16 1728 5   Constitutional: Alert and oriented. Well appearing and in no distress. Eyes: Conjunctivae are normal. Normal extraocular movements. ENT   Head: Normocephalic. Small laceration noted to left eyebrow.   Nose: No congestion/rhinnorhea.   Mouth/Throat: Mucous membranes are moist.   Neck: No stridor. Hematological/Lymphatic/Immunilogical: No cervical lymphadenopathy. Cardiovascular: Normal rate, regular rhythm.  No murmurs, rubs, or gallops. Respiratory: Normal respiratory effort without tachypnea nor retractions. Breath sounds are clear and equal bilaterally. No wheezes/rales/rhonchi. Gastrointestinal: Soft and nontender. No distention.  Genitourinary: Deferred Musculoskeletal: Normal range of motion in all extremities. No lower extremity edema. AV fistuala noted in right arm. Neurologic:  Normal speech and language. No gross focal  neurologic deficits are appreciated.  Skin:  Skin tear noted to left forearm.  Psychiatric: Mood and affect are normal. Speech and behavior are normal. Patient exhibits appropriate insight and judgment.  ____________________________________________    LABS (pertinent positives/negatives)  Labs Reviewed  CBC WITH DIFFERENTIAL/PLATELET - Abnormal; Notable for the following:       Result Value   RBC 3.41 (*)    Hemoglobin 10.1 (*)    HCT 31.0 (*)  RDW 20.0 (*)    Neutro Abs 8.3 (*)    Lymphs Abs 0.7 (*)    All other components within normal limits  BASIC METABOLIC PANEL - Abnormal; Notable for the following:    Sodium 131 (*)    Chloride 97 (*)    Glucose, Bld 199 (*)    BUN 35 (*)    Creatinine, Ser 3.56 (*)    Calcium 8.2 (*)    GFR calc non Af Amer 11 (*)    GFR calc Af Amer 13 (*)    All other components within normal limits  PROTIME-INR - Abnormal; Notable for the following:    Prothrombin Time 29.9 (*)    All other components within normal limits     ____________________________________________   EKG  None  ____________________________________________    RADIOLOGY  CT head IMPRESSION:  No acute intracranial hemorrhage.    Age-related atrophy and chronic microvascular ischemic changes.  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      ____________________________________________   PROCEDURES  Procedures  ____________________________________________   INITIAL IMPRESSION / ASSESSMENT AND PLAN / ED COURSE  Pertinent labs & imaging results that were available during my care of the patient were reviewed by me and considered in my medical decision making (see chart for details).  Patient presents after mechanical fall onto concrete. CT head negative. Patient is on coumadin and INR today 2.78. Large skin tear to left arm. Will have nurse place bacitracin and dress. ____________________________________________   FINAL CLINICAL IMPRESSION(S) / ED  DIAGNOSES  Final diagnoses:  Fall, initial encounter  Abrasion  Skin tear of elbow without complication, left, initial encounter     Note: This dictation was prepared with Dragon dictation. Any transcriptional errors that result from this process are unintentional    Phineas Semen, MD 02/11/16 [phone]

## 2016-02-11 NOTE — Progress Notes (Signed)
Natalie, [name] ([id number]) Visit Report for 02/11/2016 Arrival Information Details Patient Name: [name], [name]. Date of Service: 02/11/2016 8:00 AM Medical Record Patient Account Number: [id number] Number: Treating RN: Leslie, Jester 09/15/1933 (82 y.o. Other Clinician: Date of Birth/Sex: Female) Treating ROBSON, MICHAEL Primary Care Physician: [name] Physician/Extender: G Referring Physician: Charolotte Capuchin in Treatment: 8 Visit Information History Since Last Visit Added or deleted any medications: No Patient Arrived: Wheel Chair Any new allergies or adverse reactions: No Arrival Time: 08:06 Had a fall or experienced change in No Accompanied By: dtr activities of daily living that may affect Transfer Assistance: Manual risk of falls: Patient Identification Verified: Yes Signs or symptoms of abuse/neglect since last No Secondary Verification Process Yes visito Completed: Hospitalized since last visit: No Patient Requires Transmission- No Pain Present Now: No Based Precautions: Patient Has Alerts: Yes Patient Alerts: Patient on Blood Thinner DMII Warfarin ABI [place] Bilateral NO BP RIGHT ARM Electronic Signature(s) Signed: 02/11/2016 5:07:29 PM By: Curtis Sites Entered By: Curtis Sites on 02/11/2016 08:06:22 [name] ([id number]) -------------------------------------------------------------------------------- Encounter Discharge Information Details Patient Name: [name]. Date of Service: 02/11/2016 8:00 AM Medical Record Patient Account Number: [id number] Number: Treating RN: Haani, Bakula [birth year] (82 y.o. Other Clinician: Date of Birth/Sex: Female) Treating ROBSON, MICHAEL Primary Care Physician: [name] Physician/Extender: G Referring Physician: Charolotte Capuchin in Treatment: 8 Encounter Discharge Information Items Discharge Pain Level: 0 Discharge Condition: Stable Ambulatory Status:  Wheelchair Discharge Destination: Home Transportation: Private Auto Accompanied By: dtr Schedule Follow-up Appointment: Yes Medication Reconciliation completed and provided to Patient/Care No Erline Siddoway: Provided on Clinical Summary of Care: 02/11/2016 Form Type Recipient Paper Patient [facility] Electronic Signature(s) Signed: 02/11/2016 9:09:33 AM By: Gwenlyn Perking Entered By: Gwenlyn Perking on 02/11/2016 09:09:33 [name] ([id number]) -------------------------------------------------------------------------------- Lower Extremity Assessment Details Patient Name: [name] Date of Service: 02/11/2016 8:00 AM Medical Record Patient Account Number: [id number] Number: Treating RN: Reginald, Mangels August 11, 1933 (82 y.o. Other Clinician: Date of Birth/Sex: Female) Treating ROBSON, MICHAEL Primary Care Physician: [name] Physician/Extender: G Referring Physician: Charolotte Capuchin in Treatment: 8 Vascular Assessment Pulses: Posterior Tibial Dorsalis Pedis Palpable: [Left:No] [Right:No] Extremity colors, hair growth, and conditions: Extremity Color: [Left:Hyperpigmented] [Right:Hyperpigmented] Temperature of Extremity: [Left:Warm] [Right:Warm] Capillary Refill: [Left:< 3 seconds] [Right:< 3 seconds] Electronic Signature(s) Signed: 02/11/2016 5:07:29 PM By: Curtis Sites Entered By: Curtis Sites on 02/11/2016 08:25:09 [name] ([id number]) -------------------------------------------------------------------------------- Multi Wound Chart Details Patient Name: [name]. Date of Service: 02/11/2016 8:00 AM Medical Record Patient Account Number: [id number] Number: Treating RN: Oline, Belk [birth year] (82 y.o. 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] Secondary Etiology: [10:N/A] [11:N/A] [12:N/A] Comorbid History: [10:N/A] [11:N/A] [12:N/A] Date Acquired: [10:06/02/2015] [11:08/12/2015] [12:06/10/2015] Weeks of Treatment: [10:8] [11:8] [12:8] Wound Status: [10:Open] [11:Open] [12:Open] Pending Amputation on Yes [11:No] [12:No] Presentation: Measurements L x W x D 0.8x1x0.1 [11:2.3x4.4x1] [12:1x5.8x0.8] (cm) Area (cm) : [10:0.628] [11:7.948] [12:4.555] Volume (cm) : [10:0.063] [11:7.948] [12:3.644] % Reduction in Area: [10:65.80%] [11:79.90%] [12:74.20%] % Reduction in Volume: 82.90% [11:90.00%] [12:90.60%]  Tunneling: [10:N/A] [11:N/A] [12:N/A] Classification: [10:Grade 1] [11:Full Thickness Without Exposed Support Structures] [12:Full Thickness Without Exposed Support Structures] Exudate Amount: [10:N/A] [11:N/A] [12:N/A] Exudate Type: [10:N/A] [11:N/A] [12:N/A] Exudate Color: [10:N/A] [11:N/A] [12:N/A] Foul Odor After [10:N/A] [11:N/A] [12:N/A] Cleansing: SHERMAINE, RIVET ([id number]) Odor Anticipated Due to N/A N/A N/A Product Use: Wound Margin: N/A N/A N/A Granulation Amount: N/A N/A N/A Granulation Quality: N/A N/A N/A Necrotic Amount: N/A N/A N/A Necrotic Tissue: N/A N/A N/A Epithelialization: N/A N/A N/A Periwound Skin Texture: No Abnormalities Noted No Abnormalities Noted No Abnormalities Noted Periwound Skin No Abnormalities Noted No Abnormalities Noted No Abnormalities Noted Moisture: Periwound Skin Color: No Abnormalities Noted No  Abnormalities Noted No Abnormalities Noted Erythema Location: N/A N/A N/A Temperature: N/A N/A N/A Tenderness on No No No Palpation: Wound Preparation: N/A N/A N/A Wound Number: [id number]  Photos: No Photos No Photos No Photos Wound Location: Left Calcaneus Right Amputation Site - Left Forearm Digit Wounding Event: Gradually Appeared Surgical Injury Trauma 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 Trauma, Other the Lower Extremity Secondary Etiology: N/A N/A N/A Comorbid History: N/A N/A Arrhythmia, Congestive Heart Failure, Hypertension, Type II Diabetes Date Acquired: 12/31/2015 11/11/2015 02/05/2016 Weeks of Treatment: 6 6 0 Wound Status: Open Open Open Pending Amputation on No No No Presentation: Measurements L x W x D 0.8x0.4x0.1 0.5x0.2x0.1 15x7x0.1 (cm) Area (cm) : 0.251 0.079 82.467 Volume (cm) : 0.025 0.008 8.247 % Reduction in Area: 3.10% 89.50% N/A % Reduction in Volume: 67.90% 89.30% N/A Tunneling: N/A N/A No Classification: Grade 1 Full Thickness Without Partial Thickness Exposed Support Structures Exudate Amount: N/A N/A Large Exudate Type: N/A N/A Sanguinous Exudate Color: N/A N/A red N/A N/A No KRYSTL, WICKWARE ([id number]) Foul Odor After Cleansing: Odor Anticipated Due to N/A N/A N/A Product Use: Wound Margin: N/A N/A Flat and Intact Granulation Amount: N/A N/A Large (67-100%) Granulation Quality: N/A N/A Pink Necrotic Amount: N/A N/A None Present (0%) Necrotic Tissue: N/A N/A N/A Epithelialization: N/A N/A Medium (34-66%) Periwound Skin Texture: No Abnormalities Noted No Abnormalities Noted Edema: No Excoriation: No Induration: No Callus: No Crepitus: No Fluctuance: No Friable: No Rash: No Scarring: No Periwound Skin No Abnormalities Noted No Abnormalities Noted Maceration: No Moisture: Moist: No Dry/Scaly: No Periwound Skin Color: No Abnormalities Noted No Abnormalities Noted Atrophie Blanche: No Cyanosis:  No Ecchymosis: No Erythema: No Hemosiderin Staining: No Mottled: No Pallor: No Rubor: No Erythema Location: N/A N/A N/A Temperature: N/A N/A No Abnormality Tenderness on No No Yes Palpation: Wound Preparation: N/A N/A Ulcer Cleansing: Rinsed/Irrigated with Saline Topical Anesthetic Applied: None Wound Number: 9 N/A N/A Photos: No Photos N/A N/A Wound Location: Right Achilles N/A N/A Wounding Event: Gradually Appeared N/A N/A 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 N/A N/A Comorbid History: Arrhythmia, Congestive N/A N/A Heart Failure, ILLYANNA, PETILLO ([id number]) Hypertension, Type II Diabetes Date Acquired: 06/02/2015 N/A N/A Weeks of Treatment: 8 N/A N/A Wound Status: Open N/A N/A Pending Amputation on Yes N/A N/A Presentation: Measurements L x W x D 11.5x5.5x0.4 N/A N/A (cm) Area (cm) : 49.676 N/A N/A Volume (cm) : 19.871 N/A N/A % Reduction in Area: -52.40% N/A N/A % Reduction in Volume: -103.20% N/A N/A Position 1 (o'clock): 6 Maximum Distance 1 1.5 (cm): Position 2 (o'clock): 12 Maximum Distance 2 1.5 (cm): Tunneling: Yes N/A N/A Classification: Grade 2 N/A N/A Exudate Amount: Large N/A N/A Exudate Type: Serosanguineous N/A N/A Exudate Color: red, brown N/A N/A  Foul Odor After Yes N/A N/A Cleansing: Odor Anticipated Due to No N/A N/A Product Use: Wound Margin: Thickened N/A N/A Granulation Amount: Medium (34-66%) N/A N/A Granulation Quality: Red N/A N/A Necrotic Amount: Medium (34-66%) N/A N/A Necrotic Tissue: Eschar, Adherent Slough N/A N/A Exposed Structures: Tendon: Yes N/A N/A Fascia: No Fat: No Muscle: No Joint: No Bone: No Epithelialization: None N/A N/A Periwound Skin Texture: Edema: No N/A N/A Excoriation: No Induration: No Callus: No Crepitus: No Fluctuance: No Friable: No Rash: No Scarring: No Natalie RimaHOPKINS, Cindra Y. ([id number]) Periwound Skin  Maceration: Yes N/A N/A Moisture: Moist: Yes Dry/Scaly: No Periwound Skin Color: Erythema: Yes N/A N/A Atrophie Blanche: No Cyanosis: No Ecchymosis: No Hemosiderin Staining: No Mottled: No Pallor: No Rubor: No Erythema Location: Circumferential N/A N/A Temperature: N/A N/A N/A Tenderness on Yes N/A N/A Palpation: Wound Preparation: Ulcer Cleansing: N/A N/A Rinsed/Irrigated with Saline Topical Anesthetic Applied: Other: lidocaine 4% Treatment Notes Electronic Signature(s) Signed: 02/11/2016 5:07:29 PM By: Curtis Sitesorthy, Joanna Entered By: Curtis Sitesorthy, Joanna on 02/11/2016 08:48:45 Natalie RimaHOPKINS, Emiyah Y. ([id number]) -------------------------------------------------------------------------------- Multi-Disciplinary Care Plan Details Patient Name: Natalie RimaHOPKINS, Thelda Y. Date of Service: 02/11/2016 8:00 AM Medical Record Patient Account Number: [id number] Number: Treating RN: Curtis SitesDorthy, Joanna 08-29-1933 (82 y.o. Other Clinician: Date of Birth/Sex: Female) Treating ROBSON, MICHAEL Primary Care Physician: Rolm GalaGrandis, Heidi Physician/Extender: G Referring Physician: Charolotte CapuchinGrandis, Heidi Weeks in Treatment: 8 Active Inactive Abuse / Safety / Falls / Self Care Management Nursing Diagnoses: Impaired physical mobility Potential for falls Goals: Patient will remain injury free Date Initiated: 12/17/2015 Goal Status: Active Interventions: Assess fall risk on admission and as needed Notes: Necrotic Tissue Nursing Diagnoses: Impaired tissue integrity related to necrotic/devitalized tissue Knowledge deficit related to management of necrotic/devitalized tissue Goals: Necrotic/devitalized tissue will be minimized in the wound bed Date Initiated: 02/04/2016 Goal Status: Active Interventions: Assess patient pain level pre-, during and post procedure and prior to discharge Provide education on necrotic tissue and debridement process Treatment Activities: Apply topical anesthetic as ordered :  02/04/2016 Notes: Natalie RimaHOPKINS, Tawan Y. ([id number]) Wound/Skin Impairment Nursing Diagnoses: Impaired tissue integrity Goals: Patient/caregiver will verbalize understanding of skin care regimen Date Initiated: 12/17/2015 Goal Status: Active Ulcer/skin breakdown will have a volume reduction of 30% by week 4 Date Initiated: 12/17/2015 Goal Status: Active Ulcer/skin breakdown will have a volume reduction of 50% by week 8 Date Initiated: 12/17/2015 Goal Status: Active Ulcer/skin breakdown will have a volume reduction of 80% by week 12 Date Initiated: 12/17/2015 Goal Status: Active Ulcer/skin breakdown will heal within 14 weeks Date Initiated: 12/17/2015 Goal Status: Active Interventions: Assess patient/caregiver ability to obtain necessary supplies Assess patient/caregiver ability to perform ulcer/skin care regimen upon admission and as needed Assess ulceration(s) every visit Notes: Electronic Signature(s) Signed: 02/11/2016 5:07:29 PM By: Curtis Sitesorthy, Joanna Entered By: Curtis Sitesorthy, Joanna on 02/11/2016 08:48:38 Natalie RimaHOPKINS, Lawsyn Y. ([id number]) -------------------------------------------------------------------------------- Pain Assessment Details Patient Name: Natalie RimaHOPKINS, Satoya Y. Date of Service: 02/11/2016 8:00 AM Medical Record Patient Account Number: [id number] Number: Treating RN: Curtis SitesDorthy, Joanna 08-29-1933 (82 y.o. Other Clinician: Date of Birth/Sex: Female) Treating ROBSON, MICHAEL Primary Care Physician: Rolm GalaGrandis, Heidi Physician/Extender: G Referring Physician: Charolotte CapuchinGrandis, Heidi Weeks in Treatment: 8 Active Problems Location of Pain Severity and Description of Pain Patient Has Paino No Site Locations Pain Management and Medication Current Pain Management: Notes Topical or injectable lidocaine is offered to patient for acute pain when surgical debridement is performed. If needed, Patient is instructed to use over the counter pain medication for the following 24-48 hours  after debridement. Wound care MDs do  not prescribed pain medications. Patient has chronic pain or uncontrolled pain. Patient has been instructed to make an appointment with their Primary Care Physician for pain management. Electronic Signature(s) Signed: 02/11/2016 5:07:29 PM By: Curtis Sites Entered By: Curtis Sites on 02/11/2016 08:06:30 [name] ([id number]) -------------------------------------------------------------------------------- Patient/Caregiver Education Details Patient Name: [name] Date of Service: 02/11/2016 8:00 AM Medical Record Patient Account Number: [id number] Number: Treating RN: Celina, Shiley [birth year] (82 y.o. Other Clinician: Date of Birth/Gender: Female) Treating ROBSON, MICHAEL Primary Care Physician: [name] Physician/Extender: G Referring Physician: Charolotte Capuchin in Treatment: 8 Education Assessment Education Provided To: Patient and Caregiver Education Topics Provided Wound/Skin Impairment: Handouts: Other: wound care as ordered Methods: Demonstration, Explain/Verbal Responses: State content correctly Electronic Signature(s) Signed: 02/11/2016 5:07:29 PM By: Curtis Sites Entered By: Curtis Sites on 02/11/2016 08:57:47 [name] ([id number]) -------------------------------------------------------------------------------- Wound Assessment Details Patient Name: [name]. Date of Service: 02/11/2016 8:00 AM Medical Record Patient Account Number: [id number] Number: Treating RN: Arsema, Tusing [birth year] (82 y.o. Other Clinician: Date of Birth/Sex: Female) Treating ROBSON, MICHAEL Primary Care Physician: [name] Physician/Extender: G Referring Physician: Charolotte Capuchin in Treatment: 8 Wound Status Wound Number: 10 Primary Diabetic Wound/Ulcer of the Lower Etiology: Extremity Wound Location: Right Calcaneus Wound Open Wounding Event: Gradually  Appeared Status: Date Acquired: 06/02/2015 Comorbid Arrhythmia, Congestive Heart Failure, Weeks Of Treatment: 8 History: Hypertension, Type II Diabetes Clustered Wound: No Pending Amputation On Presentation Photos Wound Measurements Length: (cm) 0.8 % Reduction in Area: Width: (cm) 1 % Reduction in Volum Depth: (cm) 0.1 Epithelialization: Area: (cm) 0.628 Tunneling: Volume: (cm) 0.063 Undermining: 65.8% e: 82.9% None No No Wound Description Classification: Grade 1 Foul Odor After Cle Wound Margin: Flat and Intact Due to Product Use: Exudate Amount: Medium Exudate Type: Serous Exudate Color: amber ansing: Yes No Wound Bed Granulation Amount: Medium (34-66%) Exposed Structure Granulation Quality: Pink Fascia Exposed: No ANDREANA, KLINGERMAN ([id number]) Necrotic Amount: Medium (34-66%) Fat Layer Exposed: No Necrotic Quality: Eschar Tendon Exposed: No Muscle Exposed: No Joint Exposed: No Bone Exposed: No Limited to Skin Breakdown Periwound Skin Texture Texture Color No Abnormalities Noted: No No Abnormalities Noted: No Callus: No Atrophie Blanche: No Crepitus: No Cyanosis: No Excoriation: No Ecchymosis: No Fluctuance: No Erythema: Yes Friable: No Erythema Location: Circumferential Induration: No Hemosiderin Staining: No Localized Edema: No Mottled: No Rash: No Pallor: No Scarring: No Rubor: No Moisture Temperature / Pain No Abnormalities Noted: No Temperature: No Abnormality Dry / Scaly: Yes Tenderness on Palpation: Yes Maceration: No Moist: No Wound Preparation Ulcer Cleansing: Rinsed/Irrigated with Saline Topical Anesthetic Applied: Other: lidocaine 4%, Treatment Notes Wound #10 (Right Calcaneus) 1. Cleansed with: Clean wound with Normal Saline 2. Anesthetic Topical Lidocaine 4% cream to wound bed prior to debridement 4. Dressing Applied: Other dressing (specify in notes) 5. Secondary Dressing Applied Guaze, ABD and  kerlix/Conform 7. Secured with Tape Notes Proofreader) Signed: 02/11/2016 5:07:29 PM By: Neldon Labella ([id number]) Entered By: Curtis Sites on 02/11/2016 10:39:18 [name] ([id number]) -------------------------------------------------------------------------------- Wound Assessment Details Patient Name: JUANELLE, TRUEHEART. Date of Service: 02/11/2016 8:00 AM Medical Record Patient Account Number: [id number] Number: Treating RN: Shelina, Luo 07-Apr-1934 (82 y.o. 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 Etiology: Wound Location: Abdomen - Lower Quadrant - Midline Wound Open Status: Wounding Event: Trauma Comorbid Arrhythmia,  Congestive Heart Failure, Date Acquired: 08/12/2015 History: Hypertension, Type II Diabetes Weeks Of Treatment: 8 Clustered Wound: No Photos Wound Measurements Length: (cm) 2.3 Width: (cm) 4.4 Depth: (cm) 1 Area: (cm) 7.948 Volume: (cm) 7.948 % Reduction in Area: 79.9% % Reduction in Volume: 90% Epithelialization: None Tunneling: No Undermining: No Wound Description Full Thickness Without Exposed Classification: Support Structures Wound Margin: Flat and Intact Exudate Large Amount: Exudate Type: Serous Exudate Color: amber Foul Odor After Cleansing: Yes Due to Product Use: No Wound Bed Granulation Amount: Large (67-100%) Exposed Structure SHYNIECE, SCRIPTER ([id number]) Granulation Quality: Red Fascia Exposed: No Necrotic Amount: Small (1-33%) Fat Layer Exposed: No Necrotic Quality: Eschar, Adherent Slough Tendon Exposed: No Muscle Exposed: No Joint Exposed: No Bone Exposed: No Limited to Skin Breakdown Periwound Skin Texture Texture Color No Abnormalities Noted: No No  Abnormalities Noted: No Callus: No Atrophie Blanche: No Crepitus: No Cyanosis: No Excoriation: No Ecchymosis: No Fluctuance: No Erythema: Yes Friable: No Erythema Location: Circumferential Induration: No Hemosiderin Staining: No Localized Edema: No Mottled: No Rash: No Pallor: No Scarring: No Rubor: No Moisture Temperature / Pain No Abnormalities Noted: No Temperature: No Abnormality Dry / Scaly: No Tenderness on Palpation: Yes Maceration: No Moist: Yes Wound Preparation Ulcer Cleansing: Rinsed/Irrigated with Saline Topical Anesthetic Applied: Other: lidocaine 4%, Treatment Notes Wound #11 (Midline Abdomen - Lower Quadrant) 1. Cleansed with: Clean wound with Normal Saline 2. Anesthetic Topical Lidocaine 4% cream to wound bed prior to debridement 4. Dressing Applied: Other dressing (specify in notes) 5. Secondary Dressing Applied Guaze, ABD and kerlix/Conform 7. Secured with Tape Notes sorbalgon Electronic Signature(s) AMARIE, VILES ([id number]) Signed: 02/11/2016 5:07:29 PM By: Curtis Sites Entered By: Curtis Sites on 02/11/2016 10:39:43 [name] ([id number]) -------------------------------------------------------------------------------- Wound Assessment Details Patient Name: ROSINE, SOLECKI. Date of Service: 02/11/2016 8:00 AM Medical Record Patient Account Number: [id number] Number: Treating RN: Zacaria, Pousson 05/16/1934 (82 y.o. Other Clinician: Date of Birth/Sex: Female) Treating ROBSON, MICHAEL Primary Care Physician: [name] Physician/Extender: G Referring Physician: Charolotte Capuchin in Treatment: 8 Wound Status Wound Number: 12 Primary Calciphylaxis Etiology: Wound Location: Left Abdomen - Lower Quadrant Wound Open Status: Wounding Event: Trauma Comorbid Arrhythmia, Congestive Heart Failure, Date Acquired: 06/10/2015 History: Hypertension, Type II Diabetes Weeks Of Treatment: 8 Clustered Wound:  No Photos Wound Measurements Length: (cm) 1 % Reduction in Ar Width: (cm) 5.8 % Reduction in Vo Depth: (cm) 0.8 Epithelialization Area: (cm) 4.555 Tunneling: Volume: (cm) 3.644 Undermining: ea: 74.2% lume: 90.6% : None No No Wound Description Full Thickness Without Exposed Foul Odor After Classification: Support Structures Due to Product U Wound Margin: Flat and Intact Exudate Large Amount: Exudate Type: Serous Exudate Color: amber Cleansing: Yes se: No Wound Bed Granulation Amount: Large (67-100%) Exposed Structure STORM, DULSKI ([id number]) Granulation Quality: Red Fascia Exposed: No Necrotic Amount: Small (1-33%) Fat Layer Exposed: No Necrotic Quality: Adherent Slough Tendon Exposed: No Muscle Exposed: No Joint Exposed: No Bone Exposed: No Limited to Skin Breakdown Periwound Skin Texture Texture Color No Abnormalities Noted: No No Abnormalities Noted: No Callus: No Atrophie Blanche: No Crepitus: No Cyanosis: No Excoriation: No Ecchymosis: No Fluctuance: No Erythema: Yes Friable: No Erythema Location: Circumferential Induration: No Hemosiderin Staining: No Localized Edema: No Mottled: No Rash: No Pallor: No Scarring: No Rubor: No Moisture Temperature / Pain No Abnormalities Noted: No Temperature: No Abnormality Dry / Scaly: No Tenderness on Palpation: Yes Maceration: No Moist: Yes Wound Preparation Ulcer Cleansing: Rinsed/Irrigated with Saline Topical Anesthetic Applied: Other: lidocaine 4%, Treatment  Notes Wound #12 (Left Abdomen - Lower Quadrant) 1. Cleansed with: Clean wound with Normal Saline 2. Anesthetic Topical Lidocaine 4% cream to wound bed prior to debridement 4. Dressing Applied: Other dressing (specify in notes) 5. Secondary Dressing Applied Guaze, ABD and kerlix/Conform 7. Secured with Tape Notes sorbalgon Electronic Signature(s) NYX, KEADY ([id number]) Signed: 02/11/2016 5:07:29 PM By: Curtis Sites Entered By: Curtis Sites on 02/11/2016 10:40:19 [name] ([id number]) -------------------------------------------------------------------------------- Wound Assessment Details Patient Name: BRIUNNA, LEICHT. Date of Service: 02/11/2016 8:00 AM Medical Record Patient Account Number: [id number] Number: Treating RN: Tashonda, Pinkus November 12, 1933 (82 y.o. Other Clinician: Date of Birth/Sex: Female) Treating ROBSON, MICHAEL Primary Care Physician: [name] Physician/Extender: G Referring Physician: Charolotte Capuchin in Treatment: 8 Wound Status Wound Number: 13 Primary Diabetic Wound/Ulcer of the Lower Etiology: Extremity Wound Location: Left Calcaneus Wound Open Wounding Event: Gradually Appeared Status: Date Acquired: 12/31/2015 Comorbid Arrhythmia, Congestive Heart Failure, Weeks Of Treatment: 6 History: Hypertension, Type II Diabetes Clustered Wound: No Photos Wound Measurements Length: (cm) 0.8 % Reduction in Area Width: (cm) 0.4 % Reduction in Volu Depth: (cm) 0.1 Epithelialization: Area: (cm) 0.251 Tunneling: Volume: (cm) 0.025 Undermining: : 3.1% me: 67.9% Medium (34-66%) No No Wound Description Classification: Grade 1 Foul Odor After Cl Wound Margin: Flat and Intact Exudate Amount: Medium Exudate Type: Serosanguineous Exudate Color: red, brown eansing: No Wound Bed Granulation Amount: None Present (0%) Exposed Structure Necrotic Amount: Large (67-100%) Fascia Exposed: No Necrotic Quality: Eschar Fat Layer Exposed: No ADELISA, SATTERWHITE ([id number]) Tendon Exposed: No Muscle Exposed: No Joint Exposed: No Bone Exposed: No Limited to Skin Breakdown Periwound Skin Texture Texture Color No Abnormalities Noted: No No Abnormalities Noted: No Callus: No Atrophie Blanche: No Crepitus: No Cyanosis: No Excoriation: No Ecchymosis: No Fluctuance: No Erythema: No Friable: No Hemosiderin Staining: No Induration:  No Mottled: No Localized Edema: No Pallor: No Rash: No Rubor: No Scarring: No Temperature / Pain Moisture Temperature: No Abnormality No Abnormalities Noted: No Dry / Scaly: No Maceration: No Moist: Yes Wound Preparation Ulcer Cleansing: Rinsed/Irrigated with Saline Topical Anesthetic Applied: Other: lidocaine 4%, Treatment Notes Wound #13 (Left Calcaneus) 1. Cleansed with: Clean wound with Normal Saline 2. Anesthetic Topical Lidocaine 4% cream to wound bed prior to debridement 4. Dressing Applied: Other dressing (specify in notes) 5. Secondary Dressing Applied Guaze, ABD and kerlix/Conform 7. Secured with Tape Notes sorbalgon Electronic Signature(s) Signed: 02/11/2016 5:07:29 PM By: Curtis Sites Entered By: Curtis Sites on 02/11/2016 10:40:43 ROME, SCHLAUCH ([id number]) MOLLYANN, HALBERT ([id number]) -------------------------------------------------------------------------------- Wound Assessment Details Patient Name: WALTA, BELLVILLE. Date of Service: 02/11/2016 8:00 AM Medical Record Patient Account Number: [id number] Number: Treating RN: Izzabelle, Bouley Mar 19, 1934 (82 y.o. Other Clinician: Date of Birth/Sex: Female) Treating ROBSON, MICHAEL Primary Care Physician: [name] Physician/Extender: G Referring Physician: Charolotte Capuchin in Treatment: 8 Wound Status Wound Number: 14 Primary Open Surgical Wound Etiology: Wound Location: Right Amputation Site - Digit Wound Open Wounding Event: Surgical Injury Status: Date Acquired: 11/11/2015 Comorbid Arrhythmia, Congestive Heart Failure, Weeks Of Treatment: 6 History: Hypertension, Type II Diabetes Clustered Wound: No Photos Wound Measurements Length: (cm) 0.5 Width: (cm) 0.2 Depth: (cm) 0.1 Area: (cm) 0.079 Volume: (cm) 0.008 % Reduction in Area: 89.5% % Reduction in Volume: 89.3% Epithelialization: None Tunneling: No Undermining: No Wound Description Full Thickness  Without Exposed Classification: Support Structures Wound Margin: Flat and Intact Exudate Medium Amount: Exudate Type: Serous Exudate Color: amber Foul Odor After Cleansing: No Wound Bed Granulation Amount: None  Present (0%) Exposed Structure Necrotic Amount: Large (67-100%) Fascia Exposed: No MEDIA, PIZZINI ([id number]) Necrotic Quality: Eschar, Adherent Slough Fat Layer Exposed: No Tendon Exposed: No Muscle Exposed: No Joint Exposed: No Bone Exposed: No Limited to Skin Breakdown Periwound Skin Texture Texture Color No Abnormalities Noted: No No Abnormalities Noted: No Callus: No Atrophie Blanche: No Crepitus: No Cyanosis: No Excoriation: No Ecchymosis: No Fluctuance: No Erythema: No Friable: No Hemosiderin Staining: No Induration: No Mottled: No Localized Edema: No Pallor: No Rash: No Rubor: No Scarring: No Temperature / Pain Moisture Temperature: No Abnormality No Abnormalities Noted: No Dry / Scaly: No Maceration: Yes Moist: Yes Wound Preparation Ulcer Cleansing: Rinsed/Irrigated with Saline Topical Anesthetic Applied: Other: lidocaine 4%, Treatment Notes Wound #14 (Right Amputation Site - Digit) 1. Cleansed with: Clean wound with Normal Saline 2. Anesthetic Topical Lidocaine 4% cream to wound bed prior to debridement 4. Dressing Applied: Other dressing (specify in notes) 5. Secondary Dressing Applied Guaze, ABD and kerlix/Conform 7. Secured with Tape Notes Proofreader) Signed: 02/11/2016 5:07:29 PM By: Neldon Labella ([id number]) Entered By: Curtis Sites on 02/11/2016 10:41:20 [name] ([id number]) -------------------------------------------------------------------------------- Wound Assessment Details Patient Name: NASTASSJA, WITKOP. Date of Service: 02/11/2016 8:00 AM Medical Record Patient Account Number: [id number] Number: Treating RN: Doneen, Ollinger 1934/02/02 (82 y.o. Other  Clinician: Date of Birth/Sex: Female) Treating ROBSON, MICHAEL Primary Care Physician: [name] Physician/Extender: G Referring Physician: Charolotte Capuchin in Treatment: 8 Wound Status Wound Number: 15 Primary Trauma, Other Etiology: Wound Location: Left Forearm Wound Open Wounding Event: Trauma Status: Date Acquired: 02/05/2016 Comorbid Arrhythmia, Congestive Heart Failure, Weeks Of Treatment: 0 History: Hypertension, Type II Diabetes Clustered Wound: No Photos Wound Measurements Length: (cm) 15 % Reduction in Area Width: (cm) 7 % Reduction in Volu Depth: (cm) 0.1 Epithelialization: Area: (cm) 82.467 Tunneling: Volume: (cm) 8.247 Undermining: : 0% me: 0% Medium (34-66%) No No Wound Description Classification: Partial Thickness Foul Odor After Cl Wound Margin: Flat and Intact Exudate Amount: Large Exudate Type: Sanguinous Exudate Color: red eansing: No Wound Bed Granulation Amount: Large (67-100%) Exposed Structure Granulation Quality: Pink Fascia Exposed: No Necrotic Amount: None Present (0%) Fat Layer Exposed: No TRIANNA, LUPIEN ([id number]) Tendon Exposed: No Muscle Exposed: No Joint Exposed: No Bone Exposed: No Limited to Skin Breakdown Periwound Skin Texture Texture Color No Abnormalities Noted: No No Abnormalities Noted: No Callus: No Atrophie Blanche: No Crepitus: No Cyanosis: No Excoriation: No Ecchymosis: No Fluctuance: No Erythema: No Friable: No Hemosiderin Staining: No Induration: No Mottled: No Localized Edema: No Pallor: No Rash: No Rubor: No Scarring: No Temperature / Pain Moisture Temperature: No Abnormality No Abnormalities Noted: No Tenderness on Palpation: Yes Dry / Scaly: No Maceration: No Moist: No Wound Preparation Ulcer Cleansing: Rinsed/Irrigated with Saline Topical Anesthetic Applied: None Treatment Notes Wound #15 (Left Forearm) 1. Cleansed with: Clean wound with Normal Saline 4. Dressing  Applied: Promogran 5. Secondary Dressing Applied ABD and Kerlix/Conform 7. Secured with Tape Notes netting Electronic Signature(s) Signed: 02/11/2016 5:07:29 PM By: Curtis Sites Entered By: Curtis Sites on 02/11/2016 10:41:50 [name] ([id number]) -------------------------------------------------------------------------------- Wound Assessment Details Patient Name: [name] Date of Service: 02/11/2016 8:00 AM Medical Record Patient Account Number: [id number] Number: Treating RN: Iridian, Reader Jun 19, 1933 (82 y.o. 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 Event: Gradually Appeared Etiology: Date Acquired: 06/02/2015 Wound Open Weeks Of Treatment: 8 Status: Clustered Wound: No Comorbid Arrhythmia, Congestive Heart Pending Amputation On Presentation History: Failure, Hypertension, Type II Diabetes Photos Wound Measurements Length: (cm) 11.5 % Reduction in Area: Width: (cm) 5.5 % Reduction in Volum Depth: (cm) 0.4 Epithelialization: Area: (cm) 49.676 Tunneling: Volume: (cm) 19.871 Location 1 Position (o'cl Maximum Distan Location 2 Position (o'cl Maximum Distan -52.4% e: -103.2% None Yes ock): 6 ce: (cm) 1.5 ock): 12 ce: (cm) 1.5 Undermining: No Wound Description Classification: Grade 2 Foul Odor After LAIBA, FUERTE ([id number]) ansing: Yes Wound Margin: Thickened Due to Product Use: No Exudate Amount: Large Exudate Type: Serosanguineous Exudate Color: red, brown Wound Bed Granulation Amount: Medium (34-66%) Exposed Structure Granulation Quality: Red Fascia Exposed: No Necrotic Amount: Medium  (34-66%) Fat Layer Exposed: No Necrotic Quality: Eschar, Adherent Slough Tendon Exposed: Yes Muscle Exposed: No Joint Exposed: No Bone Exposed: No Periwound Skin Texture Texture Color No Abnormalities Noted: No No Abnormalities Noted: No Callus: No Atrophie Blanche: No Crepitus: No Cyanosis: No Excoriation: No Ecchymosis: No Fluctuance: No Erythema: Yes Friable: No Erythema Location: Circumferential Induration: No Hemosiderin Staining: No Localized Edema: No Mottled: No Rash: No Pallor: No Scarring: No Rubor: No Moisture Temperature / Pain No Abnormalities Noted: No Tenderness on Palpation: Yes Dry / Scaly: No Maceration: Yes Moist: Yes Wound Preparation Ulcer Cleansing: Rinsed/Irrigated with Saline Topical Anesthetic Applied: Other: lidocaine 4%, Treatment Notes Wound #9 (Right Achilles) 1. Cleansed with: Clean wound with Normal Saline 2. Anesthetic Topical Lidocaine 4% cream to wound bed prior to debridement 4. Dressing Applied: Other dressing (specify in notes) 5. Secondary Dressing Applied Guaze, ABD and kerlix/Conform LADESHA, PACINI ([id number]) 7. Secured with Tape Notes sorbalgon Electronic Signature(s) Signed: 02/11/2016 5:07:29 PM By: Curtis Sites Entered By: Curtis Sites on 02/11/2016 10:42:19 [name] ([id number]) -------------------------------------------------------------------------------- Vitals Details Patient Name: [name] Date of Service: 02/11/2016 8:00 AM Medical Record Patient Account Number: [id number] Number: Treating RN: Caidance, Sybert 12/08/1933 (82 y.o. Other Clinician: Date of Birth/Sex: Female) Treating ROBSON, MICHAEL Primary Care Physician: [name] Physician/Extender: G Referring Physician: Charolotte Capuchin in Treatment: 8 Vital Signs Time Taken: 08:08 Temperature (F): 97.8 Height (in): 65 Pulse (bpm): 79 Weight (lbs): 160 Respiratory Rate (breaths/min): 18 Body  Mass Index (BMI): 26.6 Blood Pressure (mmHg): 113/51 Reference Range: 80 - 120 mg / dl Electronic Signature(s) Signed: 02/11/2016 5:07:29 PM By: Curtis Sites Entered By: Curtis Sites on 02/11/2016 08:10:16

## 2016-02-11 NOTE — Progress Notes (Signed)
MARKEDA, NARVAEZ ([id number]) Visit Report for 02/11/2016 Chief Complaint Document Details Patient Name: [name], [name]. Date of Service: 02/11/2016 8:00 AM Medical Record Patient Account Number: [id number] Number: Treating RN: Cinderella, Christoffersen 1933/10/28 (82 y.o. Other Clinician: Date of Birth/Sex: Female) Treating Saurav Crumble Primary Care Physician/Extender: Rex Kras, Heidi Physician: Referring Physician: Charolotte Capuchin in Treatment: 8 Information Obtained from: Patient Chief Complaint Patients presents for treatment of an open diabetic ulcer, arterial ulcer and pressure ulcers to the right lower extremities which is a complex etiology for the last several months. He also has ulcers on her abdomen in the left lower quadrant and left suprapubic area which she's had for about 4 months. 12/17/15; patient returns to clinic apparently referred back from vascular surgery for ongoing wound care. Electronic Signature(s) Signed: 02/11/2016 4:19:04 PM By: Baltazar Najjar MD Entered By: Baltazar Najjar on 02/11/2016 09:01:18 [name] ([id number]) -------------------------------------------------------------------------------- Debridement Details Patient Name: [name] Date of Service: 02/11/2016 8:00 AM Medical Record Patient Account Number: [id number] Number: Treating RN: Shikha, Bibb 04-27-1934 (82 y.o. Other Clinician: Date of Birth/Sex: Female) Treating Angeliah Wisdom Primary Care Physician/Extender: Rex Kras, Heidi Physician: Referring Physician: Charolotte Capuchin in Treatment: 8 Debridement Performed for Wound #14 Right Amputation Site - Digit Assessment: Performed By: Physician Maxwell Caul, MD Debridement: Debridement Pre-procedure Yes - 08:49 Verification/Time Out Taken: Start Time: 08:49 Pain Control: Lidocaine 4% Topical Solution Level: Skin/Subcutaneous Tissue Total Area Debrided (L x 0.5 (cm) x 0.2 (cm) = 0.1  (cm) W): Tissue and other Viable, Non-Viable, Eschar, Fibrin/Slough, Subcutaneous material debrided: Instrument: Curette Bleeding: Minimum Hemostasis Achieved: Pressure End Time: 08:49 Procedural Pain: 0 Post Procedural Pain: 0 Response to Treatment: Procedure was tolerated well Post Debridement Measurements of Total Wound Length: (cm) 0.5 Width: (cm) 0.2 Depth: (cm) 0.1 Volume: (cm) 0.008 Character of Wound/Ulcer Post Requires Further Debridement Debridement: Severity of Tissue Post Debridement: Fat layer exposed Post Procedure Diagnosis Same as Pre-procedure Electronic Signature(s) Signed: 02/11/2016 4:19:04 PM By: Baltazar Najjar MD [name] ([id number]) Signed: 02/11/2016 5:07:29 PM By: Curtis Sites Entered By: Baltazar Najjar on 02/11/2016 09:00:37 [name] ([id number]) -------------------------------------------------------------------------------- Debridement Details Patient Name: [name]. Date of Service: 02/11/2016 8:00 AM Medical Record Patient Account Number: [id number] Number: Treating RN: Zyah, Gomm 1934/05/14 (82 y.o. Other Clinician: Date of Birth/Sex: Female) Treating Barlow Harrison Primary Care Physician/Extender: Rex Kras, Heidi Physician: Referring Physician: Charolotte Capuchin in Treatment: 8 Debridement Performed for Wound #9 Right Achilles Assessment: Performed By: Physician Maxwell Caul, MD Debridement: Debridement Pre-procedure Yes - 08:44 Verification/Time Out Taken: Start Time: 08:45 Pain Control: Lidocaine 4% Topical Solution Level: Skin/Subcutaneous Tissue Total Area Debrided (L x 11.5 (cm) x 5.5 (cm) = 63.25 (cm) W): Tissue and other Viable, Non-Viable, Eschar, Fibrin/Slough, Subcutaneous material debrided: Instrument: Blade, Forceps Specimen: Swab Number of Specimens 1 Taken: Bleeding: Minimum Hemostasis Achieved: Pressure End Time: 08:49 Procedural Pain: 0 Post  Procedural Pain: 0 Response to Treatment: Procedure was tolerated well Post Debridement Measurements of Total Wound Length: (cm) 11.5 Width: (cm) 5.5 Depth: (cm) 0.4 Volume: (cm) 19.871 Character of Wound/Ulcer Post Requires Further Debridement Debridement: Severity of Tissue Post Debridement: Necrosis of muscle Post Procedure Diagnosis Same as Pre-procedure LILLE, KARIM ([id number]) Electronic Signature(s) Signed: 02/11/2016 4:19:04 PM By: Baltazar Najjar MD Signed: 02/11/2016 5:07:29 PM By: Curtis Sites Entered By: Baltazar Najjar on 02/11/2016 09:00:56 [name] ([id number]) -------------------------------------------------------------------------------- HPI Details Patient Name: [name] Date of Service: 02/11/2016 8:00  AM Medical Record Patient Account Number: [id number] Number: Treating RN: [name], [name] 06/20/1933 (82 y.o. Other Clinician: Date of Birth/Sex: Female) Treating Tyrone Balash Primary Care Physician/Extender: Rex Kras, Heidi Physician: Referring Physician: Charolotte Capuchin in Treatment: 8 History of Present Illness Location: several wounds on the right lower extremity including her right healed right posterior ankle and right lower third of the leg. She also has wounds on her left lower quadrant of the abdomen and the suprapubic area. Quality: Patient reports experiencing a sharp pain to affected area(s). Severity: Patient states wound are getting worse. Duration: Patient has had the wound for > 3 months prior to seeking treatment at the wound center Timing: Pain in wound is Intermittent (comes and goes Context: The wound appeared gradually over time Modifying Factors: Other treatment(s) tried include:he simply been admitted to the hospital 2 weeks ago and has had procedures done on her right lower extremity and had a blockage which we are trying to get some notes Associated Signs and Symptoms: Patient reports  having increase discharge. HPI Description: 82 year old patient who is known to be diabetic, was referred to [place] by Dr. Gavin Potters for a right heel ulceration which she's had for a while. She was recently in hospital for a pneumonia and at that time and got delirious and was disoriented and sometime during this time developed a stage II ulcer on her right heel. Her past medical history is significant for bilateral pneumonia which was treated with injectable antibiotics and then to oral Levaquin which he has completed. She also has acute on chronic diastolic CHF, acute on chronic respiratory failure, end-stage renal disease on hemodialysis, atrial fibrillation, recent stroke, diabetes mellitus. The patient and her son are poor historians but from what I understand she was admitted to the hospital with an acute vascular compromise of her right lower extremity and Dr. Wyn Quaker has done a surgical procedure and we are trying to obtain these notes. There are also some vascular workup done and we will try and obtain these notes. the injury to the left lower quadrant of abdomen and the suprapubic area have been there due to a bruise and have been there for several months and no intervention has been done. 10/11/2015 -- on review of the electronics records it was noted that the patient was admitted to the hospital on 09/14/2015 with peripheral vascular disease with claudication, end-stage renal disease, pressure ulcer, chronic atrial fibrillation. She was seen by Dr. Wyn Quaker who did her right lower extremity angiogram , angioplasty of the right anterior tibial artery and thrombolysis with TPA of the right popliteal artery, and thrombectomy. She was seen by Dr. Wyn Quaker during this past week and he was pleased with the progress. He did say that if he took her to the operating room for any procedure he would debride the abdominal wound under anesthesia. She was also seen by Dr. Ether Griffins the podiatrist who thought that she may  lose her right fourth toe at some stage may need an amputation of this. CONCETTA, GUION ([id number]) 10/21/2015 --patient known to Dr. Wyn Quaker and his last office visit from 10/04/2015 has been reviewed. 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 She also had a persistent ulceration of the right heel and markedly this area and a right third and fourth toe and a small scab on the calf but these are dry and seemed to be improving. Patient also  has a left carotid endarterectomy and multiple interventions to a right brachiocephalic AV fistula. After the visit he had recommended noninvasive studies to recheck her revascularization. He was off the impression that she would likely lose the right fourth toe and the third toe was likely to heal. He was concerned about underlying muscle necrosis on her right heel and midfoot. 11/01/2015 -- an echo done in January of this year showed her left ventricular ejection fraction to be about 50-55%. The patient was seen by the PA and Dr. Driscilla Grammes office and the plan was to take her to the operating room soon to have a debridement under anesthesia for the abdominal wall wound, the Achilles tendon on the right leg and amputation of the right fourth toe. The daughter and the patient do not feel that they would be able to undergo hyperbaric oxygen therapy 5 days a week for 6 weeks. 12/17/15; this is a medically complex woman who I note was recently in this clinic however I was not involved with her care. She returns today with multiple wounds; a) she has a wound in the mid abdomen that is been there since March of this year. I note that she is been to the overall for debridement recently. The exact etiology of this wound is not really clear b) left lower quadrant abdominal wound had some sanguinous drainage when she came in here. The patient fell in  January and thinks this may have been secondary to a hematoma. c): The patient has 3 wounds on her right leg including a small wound on the right mid calf, a large area over the Achilles which currently has a wound VAC for the last 6 weeks, also a smaller wound on the distal part of the right heel. As far as I understand most of these wounds are currently been dressed with's calcium alginate. According to her daughter the Achilles wound under the wound VAC is doing well d) the patient is had an amputation of her left fifth toe in January and the right fourth toe 6 weeks ago secondary to diabetic PAD e) the patient has chronic renal failure on dialysis for the last 2 years secondary to type 2 diabetes on insulin. The daughter's knowledge there is been no biopsy of the abdominal wounds given their current appearance and lack of undefined etiology at have to wonder about calciphylaxis. 12/18/15:Addendum; I [facility] I can see no relevant x-rays of the right heel. I note her arteriogram and revascularization of her right lower extremity in April 2017. She had debridement of both abdominal wounds and the right heel and Achilles wound on 11/07/15 at which time she had a right fourth toe ray amputation. The abdominal wounds were debridement again on 6/29. I do not see any relevant pathology of these abdominal wounds 12/24/15; culture I did of the drainage from the midline abdominal wound last week showed both Proteus and ampicillin sensitive enterococcus. I've given her a course of Augmentin adjusted on dialysis days. She has no specific complaints today. Been using Santyl to the abdominal wounds in the right leg wound and the wound VAC on the right Achilles which was initially prescribed by Dr. dew 12/31/15; I have done two punch biopsies of the large midline abdominal. 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 May have been a trauma component of the one on the left lower quadrant however the  midline wound had no such history. She has a large area on the right Achilles heel with a wound VAC  prescribed by Dr. dew. A small wound on the right anterior leg.Marland Kitchen UNFORTUNATELY she has 2 new wounds today. One on the left heel which is probably a pressure area. As well her previous amputation site of her right fourth toe has dehisced and now has a small wound with significant depth at the amputation site. ELA, MOFFAT ([id number]) 01/14/2016 -- she returns after 2 weeks and had had a punch biopsy of abdominal wound done the last visit -- had a biopsy of her midline abdominal wound done and the Pathology diagnosis is that of ulceration, necrosis and inflammation and negative for dysplasia and malignancy. 01/21/16. 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 In the meantime she has new wounds of the left heel the right fourth toe amputation site is opened up. The back is stopped to the right heel area. 01/28/16; the abdominal wounds continued to improve. The extensive wound on her right Achilles also looks stable except for the lower aspect of the wound where there is a large liquefied area that probes right down to her calcaneus. This cultured Proteus last week I have her on Augmentin and doxycycline 1. I think this is going to need a course of IV antibiotics and I will call dialysis. X-ray I did last week was negative, I think she is going to need an MRI 02/04/16; right heel MRI as before Saturday. Receiving I believe IV Rocephin at dialysis 02/11/16; as it turns out the patient could not have a MRI as she has a bladder stimulator in place even though it is not currently in use since the beginning of this year. Although she has an allergy to IV contrast she apparently has done well with premedication so we will have to go for a CT scan with contrast. In the meantime she has had a fall now has a large skin tear on her left  upper arm. She went to the ER and they suggested Tegaderm over topical antibiotics Electronic Signature(s) Signed: 02/11/2016 4:19:04 PM By: Baltazar Najjar MD Entered By: Baltazar Najjar on 02/11/2016 09:03:05 [name] ([id number]) -------------------------------------------------------------------------------- Physical Exam Details Patient Name: [name] Date of Service: 02/11/2016 8:00 AM Medical Record Patient Account Number: [id number] Number: Treating RN: Imoni, Kohen August 21, 1933 (82 y.o. Other Clinician: Date of Birth/Sex: Female) Treating Keaghan Staton Primary Care Physician/Extender: Rex Kras, Heidi Physician: Referring Physician: Charolotte Capuchin in Treatment: 8 Constitutional Sitting or standing Blood Pressure is within target range for patient.. Pulse regular and within target range for patient.. Temperature is normal and within the target range for the patient.. Patient does not appear to be systemically ill. Respiratory Respiratory effort is easy and symmetric bilaterally. Rate is normal at rest and on room air.. Notes Wound exam; oBoth abdominal wounds appear to be closing down, surface of these appears healthy there is no surrounding tenderness oLarge superficial skin tear on the upper left arm related to her recent fall oThe area over her right heel continues to decline. Malodorous necrotic tissue which requires debridement. Her Achilles is clearly exposed. She also has an open area superiorly in this wound. oNecrotic material removed from the right fourth toe amputation site as well as a left heel. Neither one of these areas appears ominous Electronic Signature(s) Signed: 02/11/2016 4:19:04 PM By: Baltazar Najjar MD Entered By: Baltazar Najjar on 02/11/2016 09:05:30 [name] ([id number]) -------------------------------------------------------------------------------- Physician Orders Details Patient Name: [name] Date of Service:  02/11/2016 8:00 AM Medical Record Patient Account Number: [id number] Number: Treating RN: Betania, Dizon 01-25-1934 (82 y.o. Other Clinician: Date of Birth/Sex: Female) Treating Carver Murakami Primary Care Physician/Extender: Rex Kras, Heidi Physician: Referring Physician: Charolotte Capuchin in Treatment: 8 Verbal / Phone Orders: Yes Clinician: Curtis Sites Read Back and Verified: Yes Diagnosis Coding Wound Cleansing Wound #10 Right Calcaneus o Clean wound with Normal Saline. Wound #11 Midline Abdomen - Lower Quadrant o Clean wound with Normal Saline. Wound #12 Left Abdomen - Lower Quadrant o Clean wound with Normal Saline. Wound #13 Left Calcaneus o Clean wound with Normal Saline. Wound #14 Right Amputation Site - Digit o Clean wound with Normal Saline. Wound #15 Left Forearm o Clean wound with Normal Saline. Wound #9 Right Achilles o Clean wound with Normal Saline. Anesthetic Wound #10 Right Calcaneus o Topical Lidocaine 4% cream applied to wound bed prior to debridement - in Wound Clinic Wound #11 Midline Abdomen - Lower Quadrant o Topical Lidocaine 4% cream applied to wound bed prior to debridement - in Wound Clinic Wound #12 Left Abdomen - Lower Quadrant o Topical Lidocaine 4% cream applied to wound bed prior to debridement - in Wound Clinic Wound #13 Left Calcaneus o Topical Lidocaine 4% cream applied to wound bed prior to debridement - in Wound Clinic JAELIE, AGUILERA ([id number]) Wound #14 Right Amputation Site - Digit o Topical Lidocaine 4% cream applied to wound bed prior to debridement - in Wound Clinic Wound #15 Left Forearm o Topical Lidocaine 4% cream applied to wound bed prior to debridement - in Wound Clinic Wound #9 Right Achilles o Topical Lidocaine 4% cream applied to wound bed prior to debridement - in Wound Clinic Primary Wound Dressing Wound #10 Right Calcaneus o Aquacel Ag -  HHRN to please order this for patient o Sorbalgon Ag - in clinic Wound #11 Midline Abdomen - Lower Quadrant o Aquacel Ag - HHRN to please order this for patient o Sorbalgon Ag - in clinic Wound #12 Left Abdomen - Lower Quadrant o Aquacel Ag - HHRN to please order this for patient o Sorbalgon Ag - in clinic Wound #13 Left Calcaneus o Aquacel Ag - HHRN to please order this for patient o Sorbalgon Ag - in clinic Wound #14 Right Amputation Site - Digit o Aquacel Ag - HHRN to please order this for patient o Sorbalgon Ag - in clinic Wound #15 Left Forearm o Promogran - or collagen equivalent Wound #9 Right Achilles o Aquacel Ag - HHRN to please order this for patient o Sorbalgon Ag - in clinic Secondary Dressing Wound #10 Right Calcaneus o ABD and Kerlix/Conform o ABD and Kerlix/Conform o Dry Gauze - secure with tape o Dry Gauze - secure with tape Wound #11 Midline Abdomen - Lower Quadrant o ABD and Kerlix/Conform o ABD and Kerlix/Conform RAJAH, LAMBA ([id number]) o Dry Gauze - secure with tape o Dry Gauze - secure with tape Wound #12 Left Abdomen - Lower Quadrant o ABD and Kerlix/Conform o ABD and Kerlix/Conform o Dry Gauze - secure with tape o Dry Gauze - secure with tape Wound #13 Left Calcaneus o ABD and Kerlix/Conform o ABD and Kerlix/Conform o Dry Gauze - secure with tape o Dry Gauze - secure with tape Wound #14 Right Amputation Site - Digit o ABD and Kerlix/Conform o ABD and Kerlix/Conform o Dry Gauze - secure with tape o Dry Gauze - secure with tape Wound #15 Left Forearm o ABD and Kerlix/Conform o ABD and Kerlix/Conform o Dry  Gauze - secure with tape o Dry Gauze - secure with tape Wound #9 Right Achilles o ABD and Kerlix/Conform o ABD and Kerlix/Conform o Dry Gauze - secure with tape o Dry Gauze - secure with tape Dressing Change Frequency Wound #10 Right Calcaneus o  Change Dressing Monday, Wednesday, Friday - or as needed Wound #11 Midline Abdomen - Lower Quadrant o Change Dressing Monday, Wednesday, Friday - or as needed Wound #12 Left Abdomen - Lower Quadrant o Change Dressing Monday, Wednesday, Friday - or as needed Wound #13 Left Calcaneus o Change Dressing Monday, Wednesday, Friday - or as needed Wound #14 Right Amputation Site - Digit o Change Dressing Monday, Wednesday, Friday - or as needed SHYAH, CADMUS ([id number]) Wound #15 Left Forearm o Change Dressing Monday, Wednesday, Friday - or as needed Wound #9 Right Achilles o Change Dressing Monday, Wednesday, Friday - or as needed Follow-up Appointments Wound #10 Right Calcaneus o Return Appointment in 1 week. Wound #11 Midline Abdomen - Lower Quadrant o Return Appointment in 1 week. Wound #12 Left Abdomen - Lower Quadrant o Return Appointment in 1 week. Wound #13 Left Calcaneus o Return Appointment in 1 week. Wound #14 Right Amputation Site - Digit o Return Appointment in 1 week. Wound #15 Left Forearm o Return Appointment in 1 week. Wound #9 Right Achilles o Return Appointment in 1 week. Off-Loading Wound #10 Right Calcaneus o Other: - Float heels Wound #11 Midline Abdomen - Lower Quadrant o Other: - Float heels Wound #12 Left Abdomen - Lower Quadrant o Other: - Float heels Wound #13 Left Calcaneus o Other: - Float heels Wound #14 Right Amputation Site - Digit o Other: - Float heels Wound #15 Left Forearm o Other: - Float heels Wound #9 Right Achilles ALEXIYA, FRANQUI ([id number]) o Other: - Float heels Home Health Wound #10 Right Calcaneus o Continue Home Health Visits - [facility] - [facility] to order appropriate wound care supplies for patient o Home Health Nurse may visit PRN to address patientos wound care needs. o FACE TO FACE ENCOUNTER: MEDICARE and MEDICAID PATIENTS: I certify that this patient is under my care and that  I had a face-to-face encounter that meets the physician face-to-face encounter requirements with this patient on this date. The encounter with the patient was in whole or in part for the following MEDICAL CONDITION: (primary reason for Home Healthcare) MEDICAL NECESSITY: I certify, that based on my findings, NURSING services are a medically necessary home health service. HOME BOUND STATUS: I certify that my clinical findings support that this patient is homebound (i.e., Due to illness or injury, pt requires aid of supportive devices such as crutches, cane, wheelchairs, walkers, the use of special transportation or the assistance of another person to leave their place of residence. There is a normal inability to leave the home and doing so requires considerable and taxing effort. Other absences are for medical reasons / religious services and are infrequent or of short duration when for other reasons). o If current dressing causes regression in wound condition, may D/C ordered dressing product/s and apply Normal Saline Moist Dressing daily until next Wound Healing Center / Other MD appointment. Notify Wound Healing Center of regression in wound condition at [phone]. o Please direct any NON-WOUND related issues/requests for orders to patient's Primary Care Physician Wound #11 Midline Abdomen - Lower Quadrant o Continue Home Health Visits - [facility] to order appropriate wound care supplies for patient o Home Health Nurse may visit PRN to address  patientos wound care needs. o FACE TO FACE ENCOUNTER: MEDICARE and MEDICAID PATIENTS: I certify that this patient is under my care and that I had a face-to-face encounter that meets the physician face-to-face encounter requirements with this patient on this date. The encounter with the patient was in whole or in part for the following MEDICAL CONDITION: (primary reason for Home Healthcare) MEDICAL NECESSITY: I certify, that based on  my findings, NURSING services are a medically necessary home health service. HOME BOUND STATUS: I certify that my clinical findings support that this patient is homebound (i.e., Due to illness or injury, pt requires aid of supportive devices such as crutches, cane, wheelchairs, walkers, the use of special transportation or the assistance of another person to leave their place of residence. There is a normal inability to leave the home and doing so requires considerable and taxing effort. Other absences are for medical reasons / religious services and are infrequent or of short duration when for other reasons). o If current dressing causes regression in wound condition, may D/C ordered dressing product/s and apply Normal Saline Moist Dressing daily until next Wound Healing Center / Other MD appointment. Notify Wound Healing Center of regression in wound condition at [phone]. o Please direct any NON-WOUND related issues/requests for orders to patient's Primary Care Physician Wound #12 Left Abdomen - Lower Quadrant o [facility] Visits - [facility] [address] to order appropriate wound care supplies for patient MARELY, APGAR ([id number]) o Home Health Nurse may visit PRN to address patientos wound care needs. o FACE TO FACE ENCOUNTER: MEDICARE and MEDICAID PATIENTS: I certify that this patient is under my care and that I had a face-to-face encounter that meets the physician face-to-face encounter requirements with this patient on this date. The encounter with the patient was in whole or in part for the following MEDICAL CONDITION: (primary reason for Home Healthcare) MEDICAL NECESSITY: I certify, that based on my findings, NURSING services are a medically necessary home health service. HOME BOUND STATUS: I certify that my clinical findings support that this patient is homebound (i.e., Due to illness or injury, pt requires aid of supportive devices such as crutches, cane,  wheelchairs, walkers, the use of special transportation or the assistance of another person to leave their place of residence. There is a normal inability to leave the home and doing so requires considerable and taxing effort. Other absences are for medical reasons / religious services and are infrequent or of short duration when for other reasons). o If current dressing causes regression in wound condition, may D/C ordered dressing product/s and apply Normal Saline Moist Dressing daily until next Wound Healing Center / Other MD appointment. Notify Wound Healing Center of regression in wound condition at [phone]. o Please direct any NON-WOUND related issues/requests for orders to patient's Primary Care Physician Wound #13 Left Calcaneus o [facility] Visits - [facility] to order appropriate wound care supplies for patient o Home Health Nurse may visit PRN to address patientos wound care needs. o FACE TO FACE ENCOUNTER: MEDICARE and MEDICAID PATIENTS: I certify that this patient is under my care and that I had a face-to-face encounter that meets the physician face-to-face encounter requirements with this patient on this date. The encounter with the patient was in whole or in part for the following MEDICAL CONDITION: (primary reason for Home Healthcare) MEDICAL NECESSITY: I certify, that based on my findings, NURSING services are a medically necessary home health service. HOME BOUND STATUS: I  certify that my clinical findings support that this patient is homebound (i.e., Due to illness or injury, pt requires aid of supportive devices such as crutches, cane, wheelchairs, walkers, the use of special transportation or the assistance of another person to leave their place of residence. There is a normal inability to leave the home and doing so requires considerable and taxing effort. Other absences are for medical reasons / religious services and are infrequent or of  short duration when for other reasons). o If current dressing causes regression in wound condition, may D/C ordered dressing product/s and apply Normal Saline Moist Dressing daily until next Wound Healing Center / Other MD appointment. Notify Wound Healing Center of regression in wound condition at [phone]. o Please direct any NON-WOUND related issues/requests for orders to patient's Primary Care Physician Wound #14 Right Amputation Site - Digit o Continue [facility] Visits - [facility] to order appropriate wound care supplies for patient o Home Health Nurse may visit PRN to address patientos wound care needs. o FACE TO FACE ENCOUNTER: MEDICARE and MEDICAID PATIENTS: I certify that this patient is under my care and that I had a face-to-face encounter that meets the physician face-to-face encounter requirements with this patient on this date. The encounter with the patient was in whole or in part for the following MEDICAL CONDITION: (primary reason for Home Healthcare) MEDICAL NECESSITY: I certify, that based on my findings, NURSING services are a medically LASHANTA, ELBE ([id number]) necessary home health service. HOME BOUND STATUS: I certify that my clinical findings support that this patient is homebound (i.e., Due to illness or injury, pt requires aid of supportive devices such as crutches, cane, wheelchairs, walkers, the use of special transportation or the assistance of another person to leave their place of residence. There is a normal inability to leave the home and doing so requires considerable and taxing effort. Other absences are for medical reasons / religious services and are infrequent or of short duration when for other reasons). o If current dressing causes regression in wound condition, may D/C ordered dressing product/s and apply Normal Saline Moist Dressing daily until next Wound Healing Center / Other MD appointment. Notify Wound Healing Center  of regression in wound condition at [phone]. o Please direct any NON-WOUND related issues/requests for orders to patient's Primary Care Physician Wound #15 Left Forearm o [facility] Visits - [facility] to order appropriate wound care supplies for patient o Home Health Nurse may visit PRN to address patientos wound care needs. o FACE TO FACE ENCOUNTER: MEDICARE and MEDICAID PATIENTS: I certify that this patient is under my care and that I had a face-to-face encounter that meets the physician face-to-face encounter requirements with this patient on this date. The encounter with the patient was in whole or in part for the following MEDICAL CONDITION: (primary reason for Home Healthcare) MEDICAL NECESSITY: I certify, that based on my findings, NURSING services are a medically necessary home health service. HOME BOUND STATUS: I certify that my clinical findings support that this patient is homebound (i.e., Due to illness or injury, pt requires aid of supportive devices such as crutches, cane, wheelchairs, walkers, the use of special transportation or the assistance of another person to leave their place of residence. There is a normal inability to leave the home and doing so requires considerable and taxing effort. Other absences are for medical reasons / religious services and are infrequent or of short duration when for other reasons). o If  current dressing causes regression in wound condition, may D/C ordered dressing product/s and apply Normal Saline Moist Dressing daily until next Wound Healing Center / Other MD appointment. Notify Wound Healing Center of regression in wound condition at [phone]. o Please direct any NON-WOUND related issues/requests for orders to patient's Primary Care Physician Wound #9 Right Achilles o Continue [facility] Visits - [facility] to order appropriate wound care supplies for patient o Home Health Nurse may visit  PRN to address patientos wound care needs. o FACE TO FACE ENCOUNTER: MEDICARE and MEDICAID PATIENTS: I certify that this patient is under my care and that I had a face-to-face encounter that meets the physician face-to-face encounter requirements with this patient on this date. The encounter with the patient was in whole or in part for the following MEDICAL CONDITION: (primary reason for Home Healthcare) MEDICAL NECESSITY: I certify, that based on my findings, NURSING services are a medically necessary home health service. HOME BOUND STATUS: I certify that my clinical findings support that this patient is homebound (i.e., Due to illness or injury, pt requires aid of supportive devices such as crutches, cane, wheelchairs, walkers, the use of special transportation or the assistance of another person to leave their place of residence. There is a normal inability to leave the home and doing so requires considerable and taxing effort. [name], [name] ([id number]) absences are for medical reasons / religious services and are infrequent or of short duration when for other reasons). o If current dressing causes regression in wound condition, may D/C ordered dressing product/s and apply Normal Saline Moist Dressing daily until next Wound Healing Center / Other MD appointment. Notify Wound Healing Center of regression in wound condition at [phone]. o Please direct any NON-WOUND related issues/requests for orders to patient's Primary Care Physician Laboratory o Bacteria identified in Wound by Culture (MICRO) - right achilles oooo LOINC Code: 6462-6 oooo Convenience Name: Wound culture routine Electronic Signature(s) Signed: 02/11/2016 4:19:04 PM By: Baltazar Najjar MD Signed: 02/11/2016 5:07:29 PM By: Curtis Sites Entered By: Curtis Sites on 02/11/2016 09:57:39 [name]  ([id number]) -------------------------------------------------------------------------------- Problem List Details Patient Name: AZZIE, THIEM. Date of Service: 02/11/2016 8:00 AM Medical Record Patient Account Number: [id number] Number: Treating RN: Blandina, Renaldo [birth year] (82 y.o. Other Clinician: Date of Birth/Sex: Female) Treating Adonna Horsley Primary Care Physician/Extender: Rex Kras, Heidi Physician: Referring Physician: Charolotte Capuchin in Treatment: 8 Active Problems ICD-10 Encounter Code Description Active Date Diagnosis E11.621 Type 2 diabetes mellitus with foot ulcer 12/17/2015 Yes S31.104A Unspecified open wound of abdominal wall, left lower 12/17/2015 Yes quadrant without penetration into peritoneal cavity, initial encounter E11.51 Type 2 diabetes mellitus with diabetic peripheral 12/17/2015 Yes angiopathy without gangrene Inactive Problems Resolved Problems Electronic Signature(s) Signed: 02/11/2016 4:19:04 PM By: Baltazar Najjar MD Entered By: Baltazar Najjar on 02/11/2016 09:00:15 [name] ([id number]) -------------------------------------------------------------------------------- Progress Note Details Patient Name: [name] Date of Service: 02/11/2016 8:00 AM Medical Record Patient Account Number: [id number] Number: Treating RN: Tashona, Calk [birth year] (82 y.o. Other Clinician: Date of Birth/Sex: Female) Treating Velencia Lenart Primary Care Physician/Extender: Rex Kras, Heidi Physician: Referring Physician: Charolotte Capuchin in Treatment: 8 Subjective Chief Complaint Information obtained from Patient Patients presents for treatment of an open diabetic ulcer, arterial ulcer and pressure ulcers to the right lower extremities which is a complex etiology for the last several months. He also has ulcers on her abdomen in the left lower quadrant and left suprapubic area which she's  had for about 4 months.  12/17/15; patient returns to clinic apparently referred back from vascular surgery for ongoing wound care. History of Present Illness (HPI) The following HPI elements were documented for the patient's wound: Location: several wounds on the right lower extremity including her right healed right posterior ankle and right lower third of the leg. She also has wounds on her left lower quadrant of the abdomen and the suprapubic area. Quality: Patient reports experiencing a sharp pain to affected area(s). Severity: Patient states wound are getting worse. Duration: Patient has had the wound for > 3 months prior to seeking treatment at the wound center Timing: Pain in wound is Intermittent (comes and goes Context: The wound appeared gradually over time Modifying Factors: Other treatment(s) tried include:he simply been admitted to the hospital 2 weeks ago and has had procedures done on her right lower extremity and had a blockage which we are trying to get some notes Associated Signs and Symptoms: Patient reports having increase discharge. 82 year old patient who is known to be diabetic, was referred to [place] by Dr. Gavin Potters for a right heel ulceration which she's had for a while. She was recently in hospital for a pneumonia and at that time and got delirious and was disoriented and sometime during this time developed a stage II ulcer on her right heel. Her past medical history is significant for bilateral pneumonia which was treated with injectable antibiotics and then to oral Levaquin which he has completed. She also has acute on chronic diastolic CHF, acute on chronic respiratory failure, end-stage renal disease on hemodialysis, atrial fibrillation, recent stroke, diabetes mellitus. The patient and her son are poor historians but from what I understand she was admitted to the hospital with an acute vascular compromise of her right lower extremity and Dr. Wyn Quaker has done a surgical procedure and we are  trying to obtain these notes. There are also some vascular workup done and we will try and obtain these notes. the injury to the left lower quadrant of abdomen and the suprapubic area have been there due to a bruise and have been there for several months and no intervention has been done. GAYLIA, KASSEL ([id number]) 10/11/2015 -- on review of the electronics records it was noted that the patient was admitted to the hospital on 09/14/2015 with peripheral vascular disease with claudication, end-stage renal disease, pressure ulcer, chronic atrial fibrillation. She was seen by Dr. Wyn Quaker who did her right lower extremity angiogram , angioplasty of the right anterior tibial artery and thrombolysis with TPA of the right popliteal artery, and thrombectomy. She was seen by Dr. Wyn Quaker during this past week and he was pleased with the progress. He did say that if he took her to the operating room for any procedure he would debride the abdominal wound under anesthesia. She was also seen by Dr. Ether Griffins the podiatrist who thought that she may lose her right fourth toe at some stage may need an amputation of this. 10/21/2015 --patient known to Dr. Wyn Quaker and his last office visit from 10/04/2015 has been reviewed. 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 She also had a persistent ulceration of the right heel and markedly this area and a right third and fourth toe and a small scab on the calf but these are dry and seemed to be improving. Patient also has a left carotid endarterectomy and multiple interventions to a right  brachiocephalic AV fistula. After the visit he had recommended noninvasive studies to recheck her revascularization. He was off the impression that she would likely lose the right fourth toe and the third toe was likely to heal. He was concerned about underlying muscle necrosis on  her right heel and midfoot. 11/01/2015 -- an echo done in January of this year showed her left ventricular ejection fraction to be about 50-55%. The patient was seen by the PA and Dr. Driscilla Grammes office and the plan was to take her to the operating room soon to have a debridement under anesthesia for the abdominal wall wound, the Achilles tendon on the right leg and amputation of the right fourth toe. The daughter and the patient do not feel that they would be able to undergo hyperbaric oxygen therapy 5 days a week for 6 weeks. 12/17/15; this is a medically complex woman who I note was recently in this clinic however I was not involved with her care. She returns today with multiple wounds; a) she has a wound in the mid abdomen that is been there since March of this year. I note that she is been to the overall for debridement recently. The exact etiology of this wound is not really clear b) left lower quadrant abdominal wound had some sanguinous drainage when she came in here. The patient fell in January and thinks this may have been secondary to a hematoma. c): The patient has 3 wounds on her right leg including a small wound on the right mid calf, a large area over the Achilles which currently has a wound VAC for the last 6 weeks, also a smaller wound on the distal part of the right heel. As far as I understand most of these wounds are currently been dressed with's calcium alginate. According to her daughter the Achilles wound under the wound VAC is doing well d) the patient is had an amputation of her left fifth toe in January and the right fourth toe 6 weeks ago secondary to diabetic PAD e) the patient has chronic renal failure on dialysis for the last 2 years secondary to type 2 diabetes on insulin. The daughter's knowledge there is been no biopsy of the abdominal wounds given their current appearance and lack of undefined etiology at have to wonder about calciphylaxis. 12/18/15:Addendum; I have  reviewed [facility]. I can see no relevant x-rays of the right heel. I note her arteriogram and revascularization of her right lower extremity in April 2017. She had debridement of both abdominal wounds and the right heel and Achilles wound on 11/07/15 at which time she had a right fourth toe ray amputation. The abdominal wounds were debridement again on 6/29. I do not see any JORDIE, SKALSKY ([id number]) relevant pathology of these abdominal wounds 12/24/15; culture I did of the drainage from the midline abdominal wound last week showed both Proteus and ampicillin sensitive enterococcus. I've given her a course of Augmentin adjusted on dialysis days. She has no specific complaints today. Been using Santyl to the abdominal wounds in the right leg wound and the wound VAC on the right Achilles which was initially prescribed by Dr. dew 12/31/15; I have done two punch biopsies of the large midline abdominal. 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 May have been a trauma component of the one on the left lower quadrant however the midline wound had no such history. She has a large area on the right Achilles heel with a wound VAC prescribed by Dr. dew. A small wound  on the right anterior leg.Marland Kitchen. UNFORTUNATELY she has 2 new wounds today. One on the left heel which is probably a pressure area. As well her previous amputation site of her right fourth toe has dehisced and now has a small wound with significant depth at the amputation site. 01/14/2016 -- she returns after 2 weeks and had had a punch biopsy of abdominal wound done the last visit -- had a biopsy of her midline abdominal wound done and the Pathology diagnosis is that of ulceration, necrosis and inflammation and negative for dysplasia and malignancy. 01/21/16. 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 In the meantime she has new wounds of the left heel the right fourth toe  amputation site is opened up. The back is stopped to the right heel area. 01/28/16; the abdominal wounds continued to improve. The extensive wound on her right Achilles also looks stable except for the lower aspect of the wound where there is a large liquefied area that probes right down to her calcaneus. This cultured Proteus last week I have her on Augmentin and doxycycline 1. I think this is going to need a course of IV antibiotics and I will call dialysis. X-ray I did last week was negative, I think she is going to need an MRI 02/04/16; right heel MRI as before Saturday. Receiving I believe IV Rocephin at dialysis 02/11/16; as it turns out the patient could not have a MRI as she has a bladder stimulator in place even though it is not currently in use since the beginning of this year. Although she has an allergy to IV contrast she apparently has done well with premedication so we will have to go for a CT scan with contrast. In the meantime she has had a fall now has a large skin tear on her left upper arm. She went to the ER and they suggested Tegaderm over topical antibiotics Objective Constitutional Sitting or standing Blood Pressure is within target range for patient.. Pulse regular and within target range for patient.. Temperature is normal and within the target range for the patient.. Patient does not appear to be systemically ill. Vitals Time Taken: 8:08 AM, Height: 65 in, Weight: 160 lbs, BMI: 26.6, Temperature: 97.8 F, Pulse: 79 bpm, Respiratory Rate: 18 breaths/min, Blood Pressure: 113/51 mmHg. Respiratory Respiratory effort is easy and symmetric bilaterally. Rate is normal at rest and on room air.. General Notes: Wound exam; Both abdominal wounds appear to be closing down, surface of these Natalie RimaHOPKINS, Chani Y. ([id number]) appears healthy there is no surrounding tenderness Large superficial skin tear on the upper left arm related to her recent fall The area over her right heel  continues to decline. Malodorous necrotic tissue which requires debridement. Her Achilles is clearly exposed. She also has an open area superiorly in this wound. Necrotic material removed from the right fourth toe amputation site as well as a left heel. Neither one of these areas appears ominous Integumentary (Hair, Skin) Wound #10 status is Open. Original cause of wound was Gradually Appeared. The wound is located on the Right Calcaneus. The wound measures 0.8cm length x 1cm width x 0.1cm depth; 0.628cm^2 area and 0.063cm^3 volume. The wound is limited to skin breakdown. There is no tunneling or undermining noted. There is a medium amount of serous drainage noted. The wound margin is flat and intact. There is medium (34-66%) pink granulation within the wound bed. There is a medium (34-66%) amount of  necrotic tissue within the wound bed including Eschar. The periwound skin appearance exhibited: Dry/Scaly, Erythema. The periwound skin appearance did not exhibit: Callus, Crepitus, Excoriation, Fluctuance, Friable, Induration, Localized Edema, Rash, Scarring, Maceration, Moist, Atrophie Blanche, Cyanosis, Ecchymosis, Hemosiderin Staining, Mottled, Pallor, Rubor. The surrounding wound skin color is noted with erythema which is circumferential. Periwound temperature was noted as No Abnormality. The periwound has tenderness on palpation. Wound #11 status is Open. Original cause of wound was Trauma. The wound is located on the Midline Abdomen - Lower Quadrant. The wound measures 2.3cm length x 4.4cm width x 1cm depth; 7.948cm^2 area and 7.948cm^3 volume. The wound is limited to skin breakdown. There is no tunneling or undermining noted. There is a large amount of serous drainage noted. The wound margin is flat and intact. There is large (67-100%) red granulation within the wound bed. There is a small (1-33%) amount of necrotic tissue within the wound bed including Eschar and Adherent Slough. The  periwound skin appearance exhibited: Moist, Erythema. The periwound skin appearance did not exhibit: Callus, Crepitus, Excoriation, Fluctuance, Friable, Induration, Localized Edema, Rash, Scarring, Dry/Scaly, Maceration, Atrophie Blanche, Cyanosis, Ecchymosis, Hemosiderin Staining, Mottled, Pallor, Rubor. The surrounding wound skin color is noted with erythema which is circumferential. Periwound temperature was noted as No Abnormality. The periwound has tenderness on palpation. Wound #12 status is Open. Original cause of wound was Trauma. The wound is located on the Left Abdomen - Lower Quadrant. The wound measures 1cm length x 5.8cm width x 0.8cm depth; 4.555cm^2 area and 3.644cm^3 volume. The wound is limited to skin breakdown. There is no tunneling or undermining noted. There is a large amount of serous drainage noted. The wound margin is flat and intact. There is large (67-100%) red granulation within the wound bed. There is a small (1-33%) amount of necrotic tissue within the wound bed including Adherent Slough. The periwound skin appearance exhibited: Moist, Erythema. The periwound skin appearance did not exhibit: Callus, Crepitus, Excoriation, Fluctuance, Friable, Induration, Localized Edema, Rash, Scarring, Dry/Scaly, Maceration, Atrophie Blanche, Cyanosis, Ecchymosis, Hemosiderin Staining, Mottled, Pallor, Rubor. The surrounding wound skin color is noted with erythema which is circumferential. Periwound temperature was noted as No Abnormality. The periwound has tenderness on palpation. Wound #13 status is Open. Original cause of wound was Gradually Appeared. The wound is located on the Left Calcaneus. The wound measures 0.8cm length x 0.4cm width x 0.1cm depth; 0.251cm^2 area and 0.025cm^3 volume. The wound is limited to skin breakdown. There is no tunneling or undermining noted. There is a medium amount of serosanguineous drainage noted. The wound margin is flat and intact. There is no  granulation within the wound bed. There is a large (67-100%) amount of necrotic tissue within the wound bed including Eschar. The periwound skin appearance exhibited: Moist. The periwound skin appearance did not exhibit: Callus, Crepitus, Excoriation, Fluctuance, Friable, Induration, Localized Edema, Rash, Scarring, Dry/Scaly, Maceration, Atrophie Blanche, Cyanosis, Ecchymosis, Hemosiderin Staining, GAILE, [name]. ([id number]) Mottled, Pallor, Rubor, Erythema. Periwound temperature was noted as No Abnormality. Wound #14 status is Open. Original cause of wound was Surgical Injury. The wound is located on the Right Amputation Site - Digit. The wound measures 0.5cm length x 0.2cm width x 0.1cm depth; 0.079cm^2 area and 0.008cm^3 volume. The wound is limited to skin breakdown. There is no tunneling or undermining noted. There is a medium amount of serous drainage noted. The wound margin is flat and intact. There is no granulation within the wound bed. There is a large (67-100%) amount of necrotic  tissue within the wound bed including Eschar and Adherent Slough. The periwound skin appearance exhibited: Maceration, Moist. The periwound skin appearance did not exhibit: Callus, Crepitus, Excoriation, Fluctuance, Friable, Induration, Localized Edema, Rash, Scarring, Dry/Scaly, Atrophie Blanche, Cyanosis, Ecchymosis, Hemosiderin Staining, Mottled, Pallor, Rubor, Erythema. Periwound temperature was noted as No Abnormality. Wound #15 status is Open. Original cause of wound was Trauma. The wound is located on the Left Forearm. The wound measures 15cm length x 7cm width x 0.1cm depth; 82.467cm^2 area and 8.247cm^3 volume. The wound is limited to skin breakdown. There is no tunneling or undermining noted. There is a large amount of sanguinous drainage noted. The wound margin is flat and intact. There is large (67-100%) pink granulation within the wound bed. There is no necrotic tissue within the wound bed.  The periwound skin appearance did not exhibit: Callus, Crepitus, Excoriation, Fluctuance, Friable, Induration, Localized Edema, Rash, Scarring, Dry/Scaly, Maceration, Moist, Atrophie Blanche, Cyanosis, Ecchymosis, Hemosiderin Staining, Mottled, Pallor, Rubor, Erythema. Periwound temperature was noted as No Abnormality. The periwound has tenderness on palpation. Wound #9 status is Open. Original cause of wound was Gradually Appeared. The wound is located on the Right Achilles. The wound measures 11.5cm length x 5.5cm width x 0.4cm depth; 49.676cm^2 area and 19.871cm^3 volume. There is tendon exposed. There is no undermining noted, however, there is tunneling at 6:00 with a maximum distance of 1.5cm. There is additional tunneling and at 12:00 with a maximum distance of 1.5cm. There is a large amount of serosanguineous drainage noted. The wound margin is thickened. There is medium (34-66%) red granulation within the wound bed. There is a medium (34-66%) amount of necrotic tissue within the wound bed including Eschar and Adherent Slough. The periwound skin appearance exhibited: Maceration, Moist, Erythema. The periwound skin appearance did not exhibit: Callus, Crepitus, Excoriation, Fluctuance, Friable, Induration, Localized Edema, Rash, Scarring, Dry/Scaly, Atrophie Blanche, Cyanosis, Ecchymosis, Hemosiderin Staining, Mottled, Pallor, Rubor. The surrounding wound skin color is noted with erythema which is circumferential. The periwound has tenderness on palpation. Assessment Active Problems ICD-10 E11.621 - Type 2 diabetes mellitus with foot ulcer S31.104A - Unspecified open wound of abdominal wall, left lower quadrant without penetration into peritoneal cavity, initial encounter E11.51 - Type 2 diabetes mellitus with diabetic peripheral angiopathy without gangrene BRYSTOL, WASILEWSKI ([id number]) Procedures Wound #14 Wound #14 is an Open Surgical Wound located on the Right Amputation Site -  Digit . There was a Skin/Subcutaneous Tissue Debridement ([id number]) debridement with total area of 0.1 sq cm performed by Maxwell Caul, MD. with the following instrument(s): Curette to remove Viable and Non-Viable tissue/material including Fibrin/Slough, Eschar, and Subcutaneous after achieving pain control using Lidocaine 4% Topical Solution. A time out was conducted at 08:49, prior to the start of the procedure. A Minimum amount of bleeding was controlled with Pressure. The procedure was tolerated well with a pain level of 0 throughout and a pain level of 0 following the procedure. Post Debridement Measurements: 0.5cm length x 0.2cm width x 0.1cm depth; 0.008cm^3 volume. Character of Wound/Ulcer Post Debridement requires further debridement. Severity of Tissue Post Debridement is: Fat layer exposed. Post procedure Diagnosis Wound #14: Same as Pre-Procedure Wound #9 Wound #9 is a Diabetic Wound/Ulcer of the Lower Extremity located on the Right Achilles . There was a Skin/Subcutaneous Tissue Debridement ([id number]) debridement with total area of 63.25 sq cm performed by Maxwell Caul, MD. with the following instrument(s): Blade and Forceps to remove Viable and Non-Viable tissue/material including Fibrin/Slough, Eschar, and Subcutaneous after achieving pain  control using Lidocaine 4% Topical Solution. 1 Specimen was taken by a Swab and sent to the lab per facility protocol.A time out was conducted at 08:44, prior to the start of the procedure. A Minimum amount of bleeding was controlled with Pressure. The procedure was tolerated well with a pain level of 0 throughout and a pain level of 0 following the procedure. Post Debridement Measurements: 11.5cm length x 5.5cm width x 0.4cm depth; 19.871cm^3 volume. Character of Wound/Ulcer Post Debridement requires further debridement. Severity of Tissue Post Debridement is: Necrosis of muscle. Post procedure Diagnosis Wound #9: Same as  Pre-Procedure Plan Wound Cleansing: Wound #10 Right Calcaneus: Clean wound with Normal Saline. Wound #11 Midline Abdomen - Lower Quadrant: Clean wound with Normal Saline. Wound #12 Left Abdomen - Lower Quadrant: Clean wound with Normal Saline. Wound #13 Left Calcaneus: Clean wound with Normal Saline. Wound #14 Right Amputation Site - DigitLAKERA, VIALL ([id number]) Clean wound with Normal Saline. Wound #15 Left Forearm: Clean wound with Normal Saline. Wound #9 Right Achilles: Clean wound with Normal Saline. Anesthetic: Wound #10 Right Calcaneus: Topical Lidocaine 4% cream applied to wound bed prior to debridement - in Wound Clinic Wound #11 Midline Abdomen - Lower Quadrant: Topical Lidocaine 4% cream applied to wound bed prior to debridement - in Wound Clinic Wound #12 Left Abdomen - Lower Quadrant: Topical Lidocaine 4% cream applied to wound bed prior to debridement - in Wound Clinic Wound #13 Left Calcaneus: Topical Lidocaine 4% cream applied to wound bed prior to debridement - in Wound Clinic Wound #14 Right Amputation Site - Digit: Topical Lidocaine 4% cream applied to wound bed prior to debridement - in Wound Clinic Wound #15 Left Forearm: Topical Lidocaine 4% cream applied to wound bed prior to debridement - in Wound Clinic Wound #9 Right Achilles: Topical Lidocaine 4% cream applied to wound bed prior to debridement - in Wound Clinic Primary Wound Dressing: Wound #10 Right Calcaneus: Aquacel Ag - HHRN to please order this for patient [place] Ag - in clinic Wound #11 Midline Abdomen - Lower Quadrant: Aquacel Ag - HHRN to please order this for patient [place] Ag - in clinic Wound #12 Left Abdomen - Lower Quadrant: Aquacel Ag - HHRN to please order this for patient [place] Ag - in clinic Wound #13 Left Calcaneus: Aquacel Ag - HHRN to please order this for patient [place] Ag - in clinic Wound #14 Right Amputation Site - Digit: Aquacel Ag - HHRN to please  order this for patient [place] Ag - in clinic Wound #9 Right Achilles: Aquacel Ag - HHRN to please order this for patient [place] Ag - in clinic Wound #15 Left Forearm: Promogran - or collagen equivalent Secondary Dressing: Wound #10 Right Calcaneus: ABD and Kerlix/Conform ABD and Kerlix/Conform Dry Gauze - secure with tape Dry Gauze - secure with tape Wound #11 Midline Abdomen - Lower Quadrant: ABD and Kerlix/Conform ABD and Kerlix/Conform Dry Gauze - secure with tape LEISL, SPURRIER ([id number]) Dry Gauze - secure with tape Wound #12 Left Abdomen - Lower Quadrant: ABD and Kerlix/Conform ABD and Kerlix/Conform Dry Gauze - secure with tape Dry Gauze - secure with tape Wound #13 Left Calcaneus: ABD and Kerlix/Conform ABD and Kerlix/Conform Dry Gauze - secure with tape Dry Gauze - secure with tape Wound #14 Right Amputation Site - Digit: ABD and Kerlix/Conform ABD and Kerlix/Conform Dry Gauze - secure with tape Dry Gauze - secure with tape Wound #15 Left Forearm: ABD and Kerlix/Conform ABD and Kerlix/Conform Dry Gauze - secure  with tape Dry Gauze - secure with tape Wound #9 Right Achilles: ABD and Kerlix/Conform ABD and Kerlix/Conform Dry Gauze - secure with tape Dry Gauze - secure with tape Dressing Change Frequency: Wound #10 Right Calcaneus: Change Dressing Monday, Wednesday, Friday - or as needed Wound #11 Midline Abdomen - Lower Quadrant: Change Dressing Monday, Wednesday, Friday - or as needed Wound #12 Left Abdomen - Lower Quadrant: Change Dressing Monday, Wednesday, Friday - or as needed Wound #13 Left Calcaneus: Change Dressing Monday, Wednesday, Friday - or as needed Wound #14 Right Amputation Site - Digit: Change Dressing Monday, Wednesday, Friday - or as needed Wound #15 Left Forearm: Change Dressing Monday, Wednesday, Friday - or as needed Wound #9 Right Achilles: Change Dressing Monday, Wednesday, Friday - or as needed Follow-up  Appointments: Wound #10 Right Calcaneus: Return Appointment in 1 week. Wound #11 Midline Abdomen - Lower Quadrant: Return Appointment in 1 week. Wound #12 Left Abdomen - Lower Quadrant: Return Appointment in 1 week. Wound #13 Left Calcaneus: Return Appointment in 1 week. Wound #14 Right Amputation Site - Digit: FIZA, NATION ([id number]) Return Appointment in 1 week. Wound #15 Left Forearm: Return Appointment in 1 week. Wound #9 Right Achilles: Return Appointment in 1 week. Off-Loading: Wound #10 Right Calcaneus: Other: - Float heels Wound #11 Midline Abdomen - Lower Quadrant: Other: - Float heels Wound #12 Left Abdomen - Lower Quadrant: Other: - Float heels Wound #13 Left Calcaneus: Other: - Float heels Wound #14 Right Amputation Site - Digit: Other: - Float heels Wound #15 Left Forearm: Other: - Float heels Wound #9 Right Achilles: Other: - Float heels Home Health: Wound #10 Right Calcaneus: Continue [facility] Visits - [facility] to order appropriate wound care supplies for patient Home Health Nurse may visit PRN to address patient s wound care needs. FACE TO FACE ENCOUNTER: MEDICARE and MEDICAID PATIENTS: I certify that this patient is under my care and that I had a face-to-face encounter that meets the physician face-to-face encounter requirements with this patient on this date. The encounter with the patient was in whole or in part for the following MEDICAL CONDITION: (primary reason for Home Healthcare) MEDICAL NECESSITY: I certify, that based on my findings, NURSING services are a medically necessary home health service. HOME BOUND STATUS: I certify that my clinical findings support that this patient is homebound (i.e., Due to illness or injury, pt requires aid of supportive devices such as crutches, cane, wheelchairs, walkers, the use of special transportation or the assistance of another person to leave their place of residence. There is a normal  inability to leave the home and doing so requires considerable and taxing effort. Other absences are for medical reasons / religious services and are infrequent or of short duration when for other reasons). If current dressing causes regression in wound condition, may D/C ordered dressing product/s and apply Normal Saline Moist Dressing daily until next Wound Healing Center / Other MD appointment. Notify Wound Healing Center of regression in wound condition at [phone]. Please direct any NON-WOUND related issues/requests for orders to patient's Primary Care Physician Wound #11 Midline Abdomen - Lower Quadrant: Continue [facility] Visits - [facility] to order appropriate wound care supplies for patient Home Health Nurse may visit PRN to address patient s wound care needs. FACE TO FACE ENCOUNTER: MEDICARE and MEDICAID PATIENTS: I certify that this patient is under my care and that I had a face-to-face encounter that meets the physician face-to-face encounter requirements with this patient  on this date. The encounter with the patient was in whole or in part for the following MEDICAL CONDITION: (primary reason for Home Healthcare) MEDICAL NECESSITY: I certify, that based on my findings, NURSING services are a medically necessary home health service. HOME BOUND STATUS: I certify that my clinical findings support that this patient is homebound (i.e., Due to illness or injury, pt requires aid of supportive devices such as crutches, cane, wheelchairs, walkers, the use of special transportation or the assistance of another person to leave their place of residence. There is a normal inability to leave the home and doing so requires considerable and taxing effort. Other absences are for medical reasons / religious services and are infrequent or of short duration when for other reasons). NIANNA, IGO ([id number]) If current dressing causes regression in wound condition, may D/C ordered dressing  product/s and apply Normal Saline Moist Dressing daily until next Wound Healing Center / Other MD appointment. Notify Wound Healing Center of regression in wound condition at [phone]. Please direct any NON-WOUND related issues/requests for orders to patient's Primary Care Physician Wound #12 Left Abdomen - Lower Quadrant: Continue [facility] Visits - [facility] to order appropriate wound care supplies for patient Home Health Nurse may visit PRN to address patient s wound care needs. FACE TO FACE ENCOUNTER: MEDICARE and MEDICAID PATIENTS: I certify that this patient is under my care and that I had a face-to-face encounter that meets the physician face-to-face encounter requirements with this patient on this date. The encounter with the patient was in whole or in part for the following MEDICAL CONDITION: (primary reason for Home Healthcare) MEDICAL NECESSITY: I certify, that based on my findings, NURSING services are a medically necessary home health service. HOME BOUND STATUS: I certify that my clinical findings support that this patient is homebound (i.e., Due to illness or injury, pt requires aid of supportive devices such as crutches, cane, wheelchairs, walkers, the use of special transportation or the assistance of another person to leave their place of residence. There is a normal inability to leave the home and doing so requires considerable and taxing effort. Other absences are for medical reasons / religious services and are infrequent or of short duration when for other reasons). If current dressing causes regression in wound condition, may D/C ordered dressing product/s and apply Normal Saline Moist Dressing daily until next Wound Healing Center / Other MD appointment. Notify Wound Healing Center of regression in wound condition at [phone]. Please direct any NON-WOUND related issues/requests for orders to patient's Primary Care Physician Wound #13 Left  Calcaneus: Continue [facility] Visits - [facility] to order appropriate wound care supplies for patient Home Health Nurse may visit PRN to address patient s wound care needs. FACE TO FACE ENCOUNTER: MEDICARE and MEDICAID PATIENTS: I certify that this patient is under my care and that I had a face-to-face encounter that meets the physician face-to-face encounter requirements with this patient on this date. The encounter with the patient was in whole or in part for the following MEDICAL CONDITION: (primary reason for Home Healthcare) MEDICAL NECESSITY: I certify, that based on my findings, NURSING services are a medically necessary home health service. HOME BOUND STATUS: I certify that my clinical findings support that this patient is homebound (i.e., Due to illness or injury, pt requires aid of supportive devices such as crutches, cane, wheelchairs, walkers, the use of special transportation or the assistance of another person to leave their place of residence. There  is a normal inability to leave the home and doing so requires considerable and taxing effort. Other absences are for medical reasons / religious services and are infrequent or of short duration when for other reasons). If current dressing causes regression in wound condition, may D/C ordered dressing product/s and apply Normal Saline Moist Dressing daily until next Wound Healing Center / Other MD appointment. Notify Wound Healing Center of regression in wound condition at [phone]. Please direct any NON-WOUND related issues/requests for orders to patient's Primary Care Physician Wound #14 Right Amputation Site - Digit: Continue [facility] Visits - [facility] to order appropriate wound care supplies for patient Home Health Nurse may visit PRN to address patient s wound care needs. FACE TO FACE ENCOUNTER: MEDICARE and MEDICAID PATIENTS: I certify that this patient is under my care and that I had a face-to-face encounter  that meets the physician face-to-face encounter requirements with this patient on this date. The encounter with the patient was in whole or in part for the following MEDICAL CONDITION: (primary reason for Home Healthcare) MEDICAL NECESSITY: I certify, that based on my findings, NURSING services are a medically necessary home health service. HOME BOUND STATUS: I certify that my clinical findings support that this patient is homebound (i.e., Due to illness or injury, pt requires aid of supportive devices such as crutches, cane, wheelchairs, walkers, the use of special transportation or the assistance of another person to leave their place of residence. There is a normal inability to leave the home and doing so requires considerable and taxing effort. Other absences are for medical reasons / religious services and are infrequent or of short duration when for other reasons). [name], [name] ([id number]) If current dressing causes regression in wound condition, may D/C ordered dressing product/s and apply Normal Saline Moist Dressing daily until next Wound Healing Center / Other MD appointment. Notify Wound Healing Center of regression in wound condition at [phone]. Please direct any NON-WOUND related issues/requests for orders to patient's Primary Care Physician Wound #15 Left Forearm: Continue [facility] Visits - [facility] to order appropriate wound care supplies for patient Home Health Nurse may visit PRN to address patient s wound care needs. FACE TO FACE ENCOUNTER: MEDICARE and MEDICAID PATIENTS: I certify that this patient is under my care and that I had a face-to-face encounter that meets the physician face-to-face encounter requirements with this patient on this date. The encounter with the patient was in whole or in part for the following MEDICAL CONDITION: (primary reason for Home Healthcare) MEDICAL NECESSITY: I certify, that based on my findings, NURSING services are a  medically necessary home health service. HOME BOUND STATUS: I certify that my clinical findings support that this patient is homebound (i.e., Due to illness or injury, pt requires aid of supportive devices such as crutches, cane, wheelchairs, walkers, the use of special transportation or the assistance of another person to leave their place of residence. There is a normal inability to leave the home and doing so requires considerable and taxing effort. Other absences are for medical reasons / religious services and are infrequent or of short duration when for other reasons). If current dressing causes regression in wound condition, may D/C ordered dressing product/s and apply Normal Saline Moist Dressing daily until next Wound Healing Center / Other MD appointment. Notify Wound Healing Center of regression in wound condition at [phone]. Please direct any NON-WOUND related issues/requests for orders to patient's Primary Care Physician Wound #9 Right Achilles:  Continue [facility] Visits - [facility] to order appropriate wound care supplies for patient Home Health Nurse may visit PRN to address patient s wound care needs. FACE TO FACE ENCOUNTER: MEDICARE and MEDICAID PATIENTS: I certify that this patient is under my care and that I had a face-to-face encounter that meets the physician face-to-face encounter requirements with this patient on this date. The encounter with the patient was in whole or in part for the following MEDICAL CONDITION: (primary reason for Home Healthcare) MEDICAL NECESSITY: I certify, that based on my findings, NURSING services are a medically necessary home health service. HOME BOUND STATUS: I certify that my clinical findings support that this patient is homebound (i.e., Due to illness or injury, pt requires aid of supportive devices such as crutches, cane, wheelchairs, walkers, the use of special transportation or the assistance of another person to leave their  place of residence. There is a normal inability to leave the home and doing so requires considerable and taxing effort. Other absences are for medical reasons / religious services and are infrequent or of short duration when for other reasons). If current dressing causes regression in wound condition, may D/C ordered dressing product/s and apply Normal Saline Moist Dressing daily until next Wound Healing Center / Other MD appointment. Notify Wound Healing Center of regression in wound condition at [phone]. Please direct any NON-WOUND related issues/requests for orders to patient's Primary Care Physician o [name], [name] ([id number]) #1 unfortunately the patient is unable to have an MRI and the CT scan with contrast is complex related to allergy to contrast dye. She will need to be medicated with Solu-Medrol and Benadryl, apparently on this regimen she is done well. #2 the area on the right heel superiorly and inferiorly but especially inferiorly continues to do poorly I have recultured this area. I would be shocked if there is not underlying bone infection here. Copious amounts of necrotic material debrided. Some of this may be tendon. She also has an open area superiorly the probes deeply. #3 awaiting for further imaging of the lower right leg. I have told the patient and her daughter I think this is a limb threatening situation #4 I would like to get the CT scan done this week if possible, further antibiotics and/or consultations depend on this #5 I have done a repeat culture of the inferior part of the right heel area. Electronic Signature(s) Signed: 02/11/2016 4:19:04 PM By: Baltazar Najjar MD Entered By: Baltazar Najjar on 02/11/2016 09:19:02 [name] ([id number]) -------------------------------------------------------------------------------- SuperBill Details Patient Name: [name] Date of Service: 02/11/2016 Medical Record Patient Account Number:  [id number] Number: Treating RN: Koa, [name] [birth year] (82 y.o. Other Clinician: Date of Birth/Sex: Female) Treating Jalissa Heinzelman Primary Care Physician/Extender: Rex Kras, Heidi Physician: Weeks in Treatment: 8 Referring Physician: [name] Diagnosis Coding ICD-10 Codes Code Description E11.621 Type 2 diabetes mellitus with foot ulcer Unspecified open wound of abdominal wall, left lower quadrant without penetration into S31.104A peritoneal cavity, initial encounter E11.51 Type 2 diabetes mellitus with diabetic peripheral angiopathy without gangrene Facility Procedures CPT4 Code: [id number] Description: 11042 - DEB SUBQ TISSUE 20 SQ CM/< ICD-10 Description Diagnosis E11.621 Type 2 diabetes mellitus with foot ulcer Modifier: Quantity: 1 CPT4 Code: 13244010 Description: 11045 - DEB SUBQ TISS EA ADDL 20CM ICD-10 Description Diagnosis E11.621 Type 2 diabetes mellitus with foot ulcer Modifier: Quantity: 3 Physician Procedures CPT4 Code: 2725366 Description: 11042 - WC PHYS SUBQ TISS 20 SQ CM ICD-10 Description Diagnosis E11.621  Type 2 diabetes mellitus with foot ulcer Modifier: Quantity: 1 CPT4 Code: 1610960 MAXI, CARRERAS Description: 11045 - WC PHYS SUBQ TISS EA ADDL 20 CM ICD-10 Description Diagnosis E11.621 Type 2 diabetes mellitus with foot ulcer Y. ([id number]) Modifier: Quantity: 3 Electronic Signature(s) Signed: 02/11/2016 4:19:04 PM By: Baltazar Najjar MD Entered By: Baltazar Najjar on 02/11/2016 09:09:20

## 2016-02-13 ENCOUNTER — Ambulatory Visit
Admission: RE | Admit: 2016-02-13 | Discharge: 2016-02-13 | Disposition: A | Discharge status: Home / Self Care | Payer: Medicare Other | Source: Ambulatory Visit | Attending: Internal Medicine | Admitting: Internal Medicine

## 2016-02-13 DIAGNOSIS — I70208 Unspecified atherosclerosis of native arteries of extremities, other extremity: Secondary | ICD-10-CM | POA: Insufficient documentation

## 2016-02-13 DIAGNOSIS — Z794 Long term (current) use of insulin: Principal | ICD-10-CM

## 2016-02-13 DIAGNOSIS — E11621 Type 2 diabetes mellitus with foot ulcer: Secondary | ICD-10-CM | POA: Insufficient documentation

## 2016-02-13 DIAGNOSIS — M869 Osteomyelitis, unspecified: Secondary | ICD-10-CM

## 2016-02-13 DIAGNOSIS — L97509 Non-pressure chronic ulcer of other part of unspecified foot with unspecified severity: Principal | ICD-10-CM

## 2016-02-13 DIAGNOSIS — E1151 Type 2 diabetes mellitus with diabetic peripheral angiopathy without gangrene: Secondary | ICD-10-CM

## 2016-02-13 MED ORDER — IOPAMIDOL (ISOVUE-300) INJECTION 61%
100.0000 mL | Freq: Once | INTRAVENOUS | Status: AC | PRN
  Administered 2016-02-13: 100 mL via INTRAVENOUS

## 2016-02-14 ENCOUNTER — Inpatient Hospital Stay
Admission: EM | Admit: 2016-02-14 | Discharge: 2016-02-18 | DRG: 623 | Disposition: A | Discharge status: Skilled Nursing Facility | Payer: Medicare Other | Attending: Internal Medicine | Admitting: Internal Medicine

## 2016-02-14 ENCOUNTER — Encounter: Payer: Self-pay | Admitting: Emergency Medicine

## 2016-02-14 DIAGNOSIS — E11621 Type 2 diabetes mellitus with foot ulcer: Secondary | ICD-10-CM | POA: Diagnosis present

## 2016-02-14 DIAGNOSIS — F329 Major depressive disorder, single episode, unspecified: Secondary | ICD-10-CM | POA: Diagnosis present

## 2016-02-14 DIAGNOSIS — L97419 Non-pressure chronic ulcer of right heel and midfoot with unspecified severity: Secondary | ICD-10-CM | POA: Diagnosis present

## 2016-02-14 DIAGNOSIS — N186 End stage renal disease: Secondary | ICD-10-CM | POA: Diagnosis present

## 2016-02-14 DIAGNOSIS — F411 Generalized anxiety disorder: Secondary | ICD-10-CM | POA: Diagnosis present

## 2016-02-14 DIAGNOSIS — Z823 Family history of stroke: Secondary | ICD-10-CM

## 2016-02-14 DIAGNOSIS — Z89421 Acquired absence of other right toe(s): Secondary | ICD-10-CM

## 2016-02-14 DIAGNOSIS — I482 Chronic atrial fibrillation: Secondary | ICD-10-CM | POA: Diagnosis present

## 2016-02-14 DIAGNOSIS — E1140 Type 2 diabetes mellitus with diabetic neuropathy, unspecified: Secondary | ICD-10-CM | POA: Diagnosis present

## 2016-02-14 DIAGNOSIS — M109 Gout, unspecified: Secondary | ICD-10-CM | POA: Diagnosis present

## 2016-02-14 DIAGNOSIS — E1151 Type 2 diabetes mellitus with diabetic peripheral angiopathy without gangrene: Secondary | ICD-10-CM | POA: Diagnosis present

## 2016-02-14 DIAGNOSIS — Z7901 Long term (current) use of anticoagulants: Secondary | ICD-10-CM

## 2016-02-14 DIAGNOSIS — I132 Hypertensive heart and chronic kidney disease with heart failure and with stage 5 chronic kidney disease, or end stage renal disease: Secondary | ICD-10-CM | POA: Diagnosis present

## 2016-02-14 DIAGNOSIS — Z794 Long term (current) use of insulin: Secondary | ICD-10-CM

## 2016-02-14 DIAGNOSIS — E785 Hyperlipidemia, unspecified: Secondary | ICD-10-CM | POA: Diagnosis present

## 2016-02-14 DIAGNOSIS — Z800 Family history of malignant neoplasm of digestive organs: Secondary | ICD-10-CM

## 2016-02-14 DIAGNOSIS — E1122 Type 2 diabetes mellitus with diabetic chronic kidney disease: Secondary | ICD-10-CM | POA: Diagnosis present

## 2016-02-14 DIAGNOSIS — Z96643 Presence of artificial hip joint, bilateral: Secondary | ICD-10-CM | POA: Diagnosis present

## 2016-02-14 DIAGNOSIS — D631 Anemia in chronic kidney disease: Secondary | ICD-10-CM | POA: Diagnosis present

## 2016-02-14 DIAGNOSIS — H5340 Unspecified visual field defects: Secondary | ICD-10-CM | POA: Diagnosis present

## 2016-02-14 DIAGNOSIS — L89610 Pressure ulcer of right heel, unstageable: Secondary | ICD-10-CM | POA: Diagnosis present

## 2016-02-14 DIAGNOSIS — I4892 Unspecified atrial flutter: Secondary | ICD-10-CM | POA: Diagnosis present

## 2016-02-14 DIAGNOSIS — Z66 Do not resuscitate: Secondary | ICD-10-CM | POA: Diagnosis present

## 2016-02-14 DIAGNOSIS — Z992 Dependence on renal dialysis: Secondary | ICD-10-CM

## 2016-02-14 DIAGNOSIS — M86171 Other acute osteomyelitis, right ankle and foot: Secondary | ICD-10-CM | POA: Diagnosis present

## 2016-02-14 DIAGNOSIS — I5032 Chronic diastolic (congestive) heart failure: Secondary | ICD-10-CM | POA: Diagnosis present

## 2016-02-14 DIAGNOSIS — L8990 Pressure ulcer of unspecified site, unspecified stage: Secondary | ICD-10-CM | POA: Insufficient documentation

## 2016-02-14 DIAGNOSIS — I69998 Other sequelae following unspecified cerebrovascular disease: Secondary | ICD-10-CM

## 2016-02-14 DIAGNOSIS — E1169 Type 2 diabetes mellitus with other specified complication: Principal | ICD-10-CM | POA: Diagnosis present

## 2016-02-14 DIAGNOSIS — K5900 Constipation, unspecified: Secondary | ICD-10-CM | POA: Diagnosis present

## 2016-02-14 DIAGNOSIS — G2581 Restless legs syndrome: Secondary | ICD-10-CM | POA: Diagnosis present

## 2016-02-14 DIAGNOSIS — M869 Osteomyelitis, unspecified: Secondary | ICD-10-CM | POA: Diagnosis present

## 2016-02-14 DIAGNOSIS — L02415 Cutaneous abscess of right lower limb: Secondary | ICD-10-CM | POA: Diagnosis present

## 2016-02-14 DIAGNOSIS — K219 Gastro-esophageal reflux disease without esophagitis: Secondary | ICD-10-CM | POA: Diagnosis present

## 2016-02-14 DIAGNOSIS — Z89422 Acquired absence of other left toe(s): Secondary | ICD-10-CM

## 2016-02-14 DIAGNOSIS — L98499 Non-pressure chronic ulcer of skin of other sites with unspecified severity: Secondary | ICD-10-CM | POA: Diagnosis present

## 2016-02-14 DIAGNOSIS — Z79899 Other long term (current) drug therapy: Secondary | ICD-10-CM

## 2016-02-14 DIAGNOSIS — R531 Weakness: Secondary | ICD-10-CM

## 2016-02-14 DIAGNOSIS — Z833 Family history of diabetes mellitus: Secondary | ICD-10-CM

## 2016-02-14 LAB — CBC
HEMATOCRIT: 31.2 % — AB (ref 35.0–47.0)
HEMOGLOBIN: 10.2 g/dL — AB (ref 12.0–16.0)
MCH: 29.3 pg (ref 26.0–34.0)
MCHC: 32.8 g/dL (ref 32.0–36.0)
MCV: 89.5 fL (ref 80.0–100.0)
Platelets: 263 10*3/uL (ref 150–440)
RBC: 3.48 MIL/uL — AB (ref 3.80–5.20)
RDW: 20 % — ABNORMAL HIGH (ref 11.5–14.5)
WBC: 9.3 10*3/uL (ref 3.6–11.0)

## 2016-02-14 LAB — LACTIC ACID, PLASMA: Lactic Acid, Venous: 1.4 mmol/L (ref 0.5–1.9)

## 2016-02-14 LAB — PROTIME-INR
INR: 2.55
Prothrombin Time: 27.9 seconds — ABNORMAL HIGH (ref 11.4–15.2)

## 2016-02-14 LAB — GLUCOSE, CAPILLARY: Glucose-Capillary: 89 mg/dL (ref 65–99)

## 2016-02-14 LAB — COMPREHENSIVE METABOLIC PANEL
ALK PHOS: 82 U/L (ref 38–126)
ALT: 21 U/L (ref 14–54)
AST: 39 U/L (ref 15–41)
Albumin: 3.3 g/dL — ABNORMAL LOW (ref 3.5–5.0)
Anion gap: 14 (ref 5–15)
BILIRUBIN TOTAL: 0.4 mg/dL (ref 0.3–1.2)
BUN: 43 mg/dL — AB (ref 6–20)
CALCIUM: 8.2 mg/dL — AB (ref 8.9–10.3)
CO2: 24 mmol/L (ref 22–32)
CREATININE: 3.9 mg/dL — AB (ref 0.44–1.00)
Chloride: 98 mmol/L — ABNORMAL LOW (ref 101–111)
GFR, EST AFRICAN AMERICAN: 12 mL/min — AB (ref >60)
GFR, EST NON AFRICAN AMERICAN: 10 mL/min — AB (ref >60)
Glucose, Bld: 174 mg/dL — ABNORMAL HIGH (ref 65–99)
Potassium: 3.6 mmol/L (ref 3.5–5.1)
Sodium: 136 mmol/L (ref 135–145)
Total Protein: 8.2 g/dL — ABNORMAL HIGH (ref 6.5–8.1)

## 2016-02-14 LAB — APTT: APTT: 51 s — AB (ref 24–36)

## 2016-02-14 MED ORDER — GABAPENTIN 300 MG PO CAPS
300.0000 mg | ORAL_CAPSULE | ORAL | Status: DC
  Administered 2016-02-17: 300 mg via ORAL
  Filled 2016-02-14: qty 1

## 2016-02-14 MED ORDER — ACETAMINOPHEN 325 MG PO TABS
650.0000 mg | ORAL_TABLET | Freq: Four times a day (QID) | ORAL | Status: DC | PRN
  Administered 2016-02-16: 650 mg via ORAL
  Filled 2016-02-14 (×2): qty 2

## 2016-02-14 MED ORDER — ONDANSETRON HCL 4 MG PO TABS: 4.0000 mg | ORAL_TABLET | Freq: Four times a day (QID) | ORAL | Status: DC | PRN

## 2016-02-14 MED ORDER — INSULIN DETEMIR 100 UNIT/ML ~~LOC~~ SOLN
11.0000 [IU] | Freq: Two times a day (BID) | SUBCUTANEOUS | Status: DC
  Administered 2016-02-15 – 2016-02-16 (×3): 11 [IU] via SUBCUTANEOUS
  Filled 2016-02-14 (×6): qty 0.11

## 2016-02-14 MED ORDER — GABAPENTIN 100 MG PO CAPS
100.0000 mg | ORAL_CAPSULE | ORAL | Status: DC
  Administered 2016-02-15 – 2016-02-16 (×2): 100 mg via ORAL
  Filled 2016-02-14 (×2): qty 1

## 2016-02-14 MED ORDER — VANCOMYCIN HCL IN DEXTROSE 1-5 GM/200ML-% IV SOLN
1000.0000 mg | Freq: Once | INTRAVENOUS | Status: AC
  Administered 2016-02-14: 1000 mg via INTRAVENOUS
  Filled 2016-02-14: qty 200

## 2016-02-14 MED ORDER — FUROSEMIDE 40 MG PO TABS
80.0000 mg | ORAL_TABLET | Freq: Two times a day (BID) | ORAL | Status: DC
  Administered 2016-02-15 – 2016-02-18 (×7): 80 mg via ORAL
  Filled 2016-02-14 (×7): qty 2

## 2016-02-14 MED ORDER — SODIUM CHLORIDE 0.9% FLUSH
3.0000 mL | Freq: Two times a day (BID) | INTRAVENOUS | Status: DC
  Administered 2016-02-15 – 2016-02-18 (×4): 3 mL via INTRAVENOUS

## 2016-02-14 MED ORDER — INSULIN ASPART 100 UNIT/ML ~~LOC~~ SOLN: 0.0000 [IU] | Freq: Three times a day (TID) | SUBCUTANEOUS | Status: DC

## 2016-02-14 MED ORDER — RENA-VITE PO TABS
1.0000 | ORAL_TABLET | Freq: Every day | ORAL | Status: DC
  Administered 2016-02-15 – 2016-02-18 (×3): 1 via ORAL
  Filled 2016-02-14 (×3): qty 1

## 2016-02-14 MED ORDER — LIDOCAINE-PRILOCAINE 2.5-2.5 % EX CREA
1.0000 "application " | TOPICAL_CREAM | CUTANEOUS | Status: DC | PRN
  Filled 2016-02-14: qty 5

## 2016-02-14 MED ORDER — DILTIAZEM HCL ER COATED BEADS 120 MG PO CP24
120.0000 mg | ORAL_CAPSULE | ORAL | Status: DC
  Administered 2016-02-15 – 2016-02-18 (×4): 120 mg via ORAL
  Filled 2016-02-14 (×4): qty 1

## 2016-02-14 MED ORDER — FAMOTIDINE 20 MG PO TABS
10.0000 mg | ORAL_TABLET | Freq: Every day | ORAL | Status: DC
  Administered 2016-02-15 – 2016-02-18 (×3): 10 mg via ORAL
  Filled 2016-02-14 (×3): qty 1

## 2016-02-14 MED ORDER — ACETAMINOPHEN 650 MG RE SUPP: 650.0000 mg | Freq: Four times a day (QID) | RECTAL | Status: DC | PRN

## 2016-02-14 MED ORDER — VANCOMYCIN HCL 10 G IV SOLR: 1000.0000 mg | Freq: Once | INTRAVENOUS | Status: DC

## 2016-02-14 MED ORDER — PRAVASTATIN SODIUM 20 MG PO TABS
20.0000 mg | ORAL_TABLET | Freq: Every day | ORAL | Status: DC
  Administered 2016-02-15 – 2016-02-18 (×3): 20 mg via ORAL
  Filled 2016-02-14 (×3): qty 1

## 2016-02-14 MED ORDER — HEPARIN SODIUM (PORCINE) 5000 UNIT/ML IJ SOLN: 5000.0000 [IU] | Freq: Three times a day (TID) | INTRAMUSCULAR | Status: DC

## 2016-02-14 MED ORDER — TRAMADOL HCL 50 MG PO TABS: 50.0000 mg | ORAL_TABLET | Freq: Four times a day (QID) | ORAL | Status: DC | PRN

## 2016-02-14 MED ORDER — BIOTIN 1 MG PO CAPS: 1.0000 mg | ORAL_CAPSULE | Freq: Every day | ORAL | Status: DC

## 2016-02-14 MED ORDER — CALCIUM ACETATE (PHOS BINDER) 667 MG PO CAPS
1334.0000 mg | ORAL_CAPSULE | Freq: Three times a day (TID) | ORAL | Status: DC
  Administered 2016-02-15 – 2016-02-18 (×8): 1334 mg via ORAL
  Filled 2016-02-14 (×8): qty 2

## 2016-02-14 MED ORDER — SODIUM CHLORIDE 0.9 % IV SOLN: 250.0000 mL | INTRAVENOUS | Status: DC | PRN

## 2016-02-14 MED ORDER — PIPERACILLIN-TAZOBACTAM 3.375 G IVPB 30 MIN
3.3750 g | Freq: Once | INTRAVENOUS | Status: AC
  Administered 2016-02-14: 3.375 g via INTRAVENOUS
  Filled 2016-02-14: qty 50

## 2016-02-14 MED ORDER — ONDANSETRON HCL 4 MG/2ML IJ SOLN: 4.0000 mg | Freq: Four times a day (QID) | INTRAMUSCULAR | Status: DC | PRN

## 2016-02-14 MED ORDER — SODIUM CHLORIDE 0.9% FLUSH: 3.0000 mL | INTRAVENOUS | Status: DC | PRN

## 2016-02-14 MED ORDER — PAROXETINE HCL 20 MG PO TABS
40.0000 mg | ORAL_TABLET | Freq: Every day | ORAL | Status: DC
  Administered 2016-02-15 – 2016-02-17 (×3): 40 mg via ORAL
  Filled 2016-02-14 (×3): qty 2

## 2016-02-14 MED ORDER — ALBUTEROL SULFATE (2.5 MG/3ML) 0.083% IN NEBU: 3.0000 mL | INHALATION_SOLUTION | Freq: Four times a day (QID) | RESPIRATORY_TRACT | Status: DC | PRN

## 2016-02-14 MED ORDER — ACETAMINOPHEN 325 MG PO TABS
650.0000 mg | ORAL_TABLET | Freq: Four times a day (QID) | ORAL | Status: DC | PRN
  Administered 2016-02-16: 650 mg via ORAL

## 2016-02-14 MED ORDER — WARFARIN SODIUM 2 MG PO TABS: 2.0000 mg | ORAL_TABLET | Freq: Every day | ORAL | Status: DC

## 2016-02-14 MED ORDER — OMEGA-3-ACID ETHYL ESTERS 1 G PO CAPS
2.0000 g | ORAL_CAPSULE | Freq: Every day | ORAL | Status: DC
  Administered 2016-02-15 – 2016-02-18 (×3): 2 g via ORAL
  Filled 2016-02-14 (×2): qty 2
  Filled 2016-02-14: qty 1
  Filled 2016-02-14: qty 2

## 2016-02-14 MED ORDER — WARFARIN - PHYSICIAN DOSING INPATIENT
Freq: Every day | Status: DC
Start: 2016-02-15 — End: 2016-02-18
  Administered 2016-02-16: 18:00:00

## 2016-02-14 NOTE — ED Notes (Signed)
PT/INR collected.

## 2016-02-14 NOTE — ED Provider Notes (Signed)
[facility] Emergency Department Provider Note  ____________________________________________  Time seen: Approximately 7:00 PM  I have reviewed the triage vital signs and the nursing notes.   HISTORY  Chief Complaint Wound Infection   HPI [name] is a 82 y.o. female with a history of peripheral vascular disease, DM, calciphylaxis, ESRD on HD (M,W,F), CVA, afib, CHF who presents for evaluation of the ulceration to her right lower extremity. Patient is followed by the wound clinic and had a CT scan of of her right ankle yesterday. Today she was in shock to to come to the emergency room after dialysis. The CT scan showed concern is for a possible abscess measuring 20 x 8.5 mm Of the calcaneus, with severe osteomyelitis of the calcaneus and involvement of the Achilles tendon. Patient reports that the wound was last debrided back in June 2017. He has been without a wound VAC for a month and since then has had purulent discharge. According to her daughter and has gotten progressively worse over the course of the last week. Patient reports constant moderate pain worsened when she ambulates. She denies fever, nausea, vomiting. Her last dose of IV vancomycin was on Monday on dialysis. She is on Coumadin and her last dose was yesterday.  Past Medical History:  Diagnosis Date  . Anemia   . Arthritis    gout  . Atrial fibrillation (HCC)   . CHF (congestive heart failure) (HCC)   . Chronic kidney disease   . Diabetes mellitus without complication (HCC)   . Dialysis patient (HCC)   . Dysrhythmia   . GERD (gastroesophageal reflux disease)   . Hypertension   . Peripheral vascular disease (HCC)   . Pleural effusion   . Pulmonary hypertension (HCC)   . Renal insufficiency   . Restless leg syndrome   . Shortness of breath dyspnea    with exertion  . Stroke [facility])     Patient Active Problem List   Diagnosis Date Noted  . Hematoma 10/12/2015  . Leg pain, right  09/14/2015  . PVD (peripheral vascular disease) with claudication (HCC) 09/14/2015  . Pneumonia 08/13/2015  . Carotid stenosis 07/26/2015  . Amputation of fifth toe, left, traumatic (HCC) 06/24/2015  . Diabetes mellitus (HCC) 06/23/2015  . Diabetic neuropathy (HCC) 06/23/2015  . Chronic atrial fibrillation (HCC) 06/23/2015  . Left carotid artery stenosis 06/23/2015  . CVA (cerebral infarction) 06/21/2015  . Pressure ulcer 06/19/2015  . End stage renal disease (HCC) 06/18/2015  . Respiratory distress 12/11/2014    Past Surgical History:  Procedure Laterality Date  . AMPUTATION Right 11/07/2015   Procedure: AMPUTATION DIGIT ( 4TH TOE, RIGHT FOOT );  Surgeon: Annice Needy, MD;  Location: [facility] ORS;  Service: Vascular;  Laterality: Right;  . AMPUTATION TOE Left 06/07/2015   Procedure: AMPUTATION TOE;  Surgeon: Gwyneth Revels, DPM;  Location: [facility] ORS;  Service: Podiatry;  Laterality: Left;  . CARDIAC CATHETERIZATION    . CATARACT EXTRACTION, BILATERAL    . CHOLECYSTECTOMY    . ENDARTERECTOMY Left 07/26/2015   Procedure: ENDARTERECTOMY CAROTID;  Surgeon: Renford Dills, MD;  Location: [facility] ORS;  Service: Vascular;  Laterality: Left;  . EYE SURGERY    . FISTULAGRAM ([facility] HX)    . JOINT REPLACEMENT     bilateral hip  . MEDTRONIC BLADDER INTERSTEM     currently turned off for MRI in January  . PARATHYROIDECTOMY    . PERIPHERAL VASCULAR CATHETERIZATION N/A 10/08/2014   Procedure: A/V Shuntogram/Fistulagram;  Surgeon: Annice Needy, MD;  Location: [facility] INVASIVE CV LAB;  Service: Cardiovascular;  Laterality: N/A;  . PERIPHERAL VASCULAR CATHETERIZATION N/A 03/04/2015   Procedure: A/V Shuntogram/Fistulagram;  Surgeon: Annice Needy, MD;  Location: [facility] INVASIVE CV LAB;  Service: Cardiovascular;  Laterality: N/A;  . PERIPHERAL VASCULAR CATHETERIZATION N/A 03/04/2015   Procedure: A/V Shunt Intervention;  Surgeon: Annice Needy, MD;  Location: [facility] INVASIVE CV LAB;  Service: Cardiovascular;  Laterality: N/A;    . PERIPHERAL VASCULAR CATHETERIZATION N/A 09/16/2015   Procedure: Lower Extremity Angiography;  Surgeon: Annice Needy, MD;  Location: [facility] INVASIVE CV LAB;  Service: Cardiovascular;  Laterality: N/A;  . PERIPHERAL VASCULAR CATHETERIZATION  09/16/2015   Procedure: Lower Extremity Intervention;  Surgeon: Annice Needy, MD;  Location: [facility] INVASIVE CV LAB;  Service: Cardiovascular;;  . THYROIDECTOMY, PARTIAL    . TONSILLECTOMY    . WOUND DEBRIDEMENT Right 11/07/2015   Procedure: DEBRIDEMENT WOUND ( RIGHT HEEL AND ABDOMINAL WOUND (2) );  Surgeon: Annice Needy, MD;  Location: [facility] ORS;  Service: Vascular;  Laterality: Right;  . WOUND DEBRIDEMENT N/A 11/28/2015   Procedure: DEBRIDEMENT WOUND ( ABDOMINAL );  Surgeon: Annice Needy, MD;  Location: [facility] ORS;  Service: Vascular;  Laterality: N/A;    Prior to Admission medications   Medication Sig Start Date End Date Taking? Authorizing Provider  acetaminophen (TYLENOL) 650 MG CR tablet Take 1,300 mg by mouth 2 (two) times daily.    Historical Provider, MD  albuterol (PROVENTIL HFA;VENTOLIN HFA) 108 (90 Base) MCG/ACT inhaler Inhale 1-2 puffs into the lungs every 6 (six) hours as needed for wheezing or shortness of breath.    Historical Provider, MD  Biotin 1 MG CAPS Take 1 mg by mouth daily.     Historical Provider, MD  bisacodyl (DULCOLAX) 5 MG EC tablet Take 10 mg by mouth daily as needed for moderate constipation.     Historical Provider, MD  calcium acetate, Phos Binder, (PHOSLYRA) 667 MG/5ML SOLN Take 1,334 mg by mouth 3 (three) times daily with meals.    Historical Provider, MD  cephALEXin (KEFLEX) 500 MG capsule Take 1 capsule (500 mg total) by mouth 3 (three) times daily. 11/19/15   Irean Hong, MD  diltiazem (TIAZAC) 120 MG 24 hr capsule Take 120 mg by mouth every morning.     Historical Provider, MD  diphenhydrAMINE (BENADRYL) 25 mg capsule Take 1 capsule (25 mg total) by mouth every 6 (six) hours as needed (rash, itching). 06/24/15   Katharina Caper, MD   folic acid-vitamin b complex-vitamin c-selenium-zinc (DIALYVITE) 3 MG TABS tablet Take 1 tablet by mouth daily.    Historical Provider, MD  furosemide (LASIX) 80 MG tablet Take 80 mg by mouth 2 (two) times daily.    Historical Provider, MD  gabapentin (NEURONTIN) 100 MG capsule Take 100 mg by mouth at bedtime. Pt takes on Sunday,  Tuesday  ,Thursday and Saturday    Historical Provider, MD  gabapentin (NEURONTIN) 300 MG capsule Take 300 mg by mouth daily. Pt takes on  Monday, Wednesday, and Friday after dialysis.    Historical Provider, MD  insulin detemir (LEVEMIR) 100 UNIT/ML injection Inject 0.11 mLs (11 Units total) into the skin daily. Patient taking differently: Inject 21 Units into the skin daily. Divided dose 11 units in am, 10 units in pm 10/15/15   Katha Hamming, MD  lidocaine-prilocaine (EMLA) cream Apply 1 application topically as needed (prior to accessing port).     Historical Provider, MD  nystatin (MYCOSTATIN/NYSTOP) 100000 UNIT/GM POWD Apply topically 2 (two) times daily as needed.    Historical Provider, MD  Omega-3 Fatty Acids (FISH OIL) 1200 MG CAPS Take 1,200 mg by mouth daily.    Historical Provider, MD  PARoxetine (PAXIL) 40 MG tablet Take 40 mg by mouth at bedtime.     Historical Provider, MD  pravastatin (PRAVACHOL) 20 MG tablet Take 1 tablet (20 mg total) by mouth daily. Patient taking differently: Take 20 mg by mouth at bedtime.  07/27/15   Kimberly A Stegmayer, PA-C  ranitidine (ZANTAC) 150 MG tablet Take 150 mg by mouth daily.     Historical Provider, MD  senna-docusate (SENOKOT-S) 8.6-50 MG tablet Take 1 tablet by mouth 2 (two) times daily as needed for mild constipation or moderate constipation.     Historical Provider, MD  traMADol (ULTRAM) 50 MG tablet Take 1 tablet (50 mg total) by mouth every 6 (six) hours as needed for moderate pain. 09/18/15   Milagros LollSrikar Sudini, MD  traMADol (ULTRAM) 50 MG tablet Take 1 tablet (50 mg total) by mouth every 6 (six) hours as needed.  11/07/15   Annice NeedyJason S Dew, MD  traMADol (ULTRAM) 50 MG tablet Take 1 tablet (50 mg total) by mouth every 6 (six) hours as needed. 11/28/15   Annice NeedyJason S Dew, MD  warfarin (COUMADIN) 2 MG tablet Take 1 tablet (2 mg total) by mouth daily. 10/15/15   Katha HammingSnehalatha Konidena, MD    Allergies Codeine; Contrast media [iodinated diagnostic agents]; Oxycodone; and Quinine derivatives  Family History  Problem Relation Age of Onset  . Colon cancer Mother   . Stroke Father   . Diabetes Brother     Social History Social History  Substance Use Topics  . Smoking status: Never Smoker  . Smokeless tobacco: Never Used  . Alcohol use No    Review of Systems Constitutional: Negative for fever. Eyes: Negative for visual changes. ENT: Negative for sore throat. Cardiovascular: Negative for chest pain. Respiratory: Negative for shortness of breath. Gastrointestinal: Negative for abdominal pain, vomiting or diarrhea. Genitourinary: Negative for dysuria. Musculoskeletal: Negative for back pain. Skin: Negative for rash. + multiple ulcerations Neurological: Negative for headaches, weakness or numbness.  ____________________________________________   PHYSICAL EXAM:  VITAL SIGNS: ED Triage Vitals  Enc Vitals Group     BP 02/14/16 1548 131/61     Pulse Rate 02/14/16 1548 84     Resp 02/14/16 1548 18     Temp 02/14/16 1548 98 F (36.7 C)     Temp Source 02/14/16 1548 Oral     SpO2 02/14/16 1548 98 %     Weight 02/14/16 1549 160 lb (72.6 kg)     Height 02/14/16 1549 5\' 5"  (1.651 m)     Head Circumference --      Peak Flow --      Pain Score --      Pain Loc --      Pain Edu? --      Excl. in GC? --     Constitutional: Alert and oriented. Well appearing and in no apparent distress. HEENT:      Head: Normocephalic and atraumatic.         Eyes: Conjunctivae are normal. Sclera is non-icteric. EOMI. PERRL      Mouth/Throat: Mucous membranes are moist.       Neck: Supple with no signs of  meningismus. Cardiovascular: Regular rate and rhythm. No murmurs, gallops, or rubs. 2+ symmetrical distal pulses are present in all  extremities. No JVD. Respiratory: Normal respiratory effort. Lungs are clear to auscultation bilaterally. No wheezes, crackles, or rhonchi.  Gastrointestinal: Soft, non tender, and non distended with positive bowel sounds. No rebound or guarding. Musculoskeletal: Nontender with normal range of motion in all extremities. No edema, cyanosis, or erythema of extremities. Neurologic: Normal speech and language. Face is symmetric. Moving all extremities. No gross focal neurologic deficits are appreciated. Skin: Patient with multiple ulcerations in her abdomen which appear well healing, left lower extremity which also appear well healing, and a large ulceration in the posterior aspect of the right ankle with purulent foul discharge.  Psychiatric: Mood and affect are normal. Speech and behavior are normal.  ____________________________________________   LABS (all labs ordered are listed, but only abnormal results are displayed)  Labs Reviewed  CBC - Abnormal; Notable for the following:       Result Value   RBC 3.48 (*)    Hemoglobin 10.2 (*)    HCT 31.2 (*)    RDW 20.0 (*)    All other components within normal limits  COMPREHENSIVE METABOLIC PANEL - Abnormal; Notable for the following:    Chloride 98 (*)    Glucose, Bld 174 (*)    BUN 43 (*)    Creatinine, Ser 3.90 (*)    Calcium 8.2 (*)    Total Protein 8.2 (*)    Albumin 3.3 (*)    GFR calc non Af Amer 10 (*)    GFR calc Af Amer 12 (*)    All other components within normal limits  CULTURE, BLOOD (ROUTINE X 2)  CULTURE, BLOOD (ROUTINE X 2)  LACTIC ACID, PLASMA   ____________________________________________  EKG  none  ____________________________________________  RADIOLOGY  none  ____________________________________________   PROCEDURES  Procedure(s) performed: None Procedures Critical Care  performed:  None ____________________________________________   INITIAL IMPRESSION / ASSESSMENT AND PLAN / ED COURSE  82 y.o. female with a history of peripheral vascular disease, DM, calciphylaxis, ESRD on HD (M,W,F), CVA, afib, CHF who presents for evaluation of the ulceration to her right lower extremity and a CT scan concerning for Achilles tendon infection, and calcaneus osteomyelitis. Patient with no signs or symptoms of sepsis with normal white count, no tachycardia, and no fever. We'll order blood cultures and a lactate. Will start patient on vancomycin and Zosyn. We'll discuss with vascular surgery for admission.  Clinical Course  Comment By Time  Spoke with Dr. Evie Lacks vascular who agrees with antibiotic regimen and recommended admission to the hospitalist service for infectious disease and vascular consult. Patient will be started on vancomycin and Zosyn. Have discussed with the hospitalist for admission. Nita Sickle, MD 09/15 1946    Pertinent labs & imaging results that were available during my care of the patient were reviewed by me and considered in my medical decision making (see chart for details).    ____________________________________________   FINAL CLINICAL IMPRESSION(S) / ED DIAGNOSES  Final diagnoses:  Acute osteomyelitis of right ankle (HCC)      NEW MEDICATIONS STARTED DURING THIS VISIT:  New Prescriptions   No medications on file     Note:  This document was prepared using Dragon voice recognition software and may include unintentional dictation errors.    Nita Sickle, MD 02/14/16 [phone]

## 2016-02-14 NOTE — H&P (Signed)
[facility] - [place] at [facility]   PATIENT NAME: [name]    MR#:  [id number]  DATE OF BIRTH:  1933-11-18  DATE OF ADMISSION:  02/14/2016  PRIMARY CARE PHYSICIAN: Rolm GalaGRANDIS, HEIDI, MD   REQUESTING/REFERRING PHYSICIAN: Don PerkingVeronese MD  CHIEF COMPLAINT:   Chief Complaint  Patient presents with  . Wound Infection    HISTORY OF PRESENT ILLNESS: [name]  is a 82 y.o. female with a known history of Severe peripheral vascular disease, end-stage renal disease, diabetes type 2, history atrial fibrillation, GERD, hypertension, restless leg syndrome and history of CVA who has had a chronic right ankle wound. Who had a CT scan of her right ankle yesterday. The CT scan showed concern is for a possible abscess measuring 20 x 8.5 mm Of the calcaneus, with severe osteomyelitis of the calcaneus and involvement of the Achilles tendon. Patient has had a wound VAC placed to that area but has not seemed to have helped so was taken off a month ago. She has been on multiple courses of oral antibiotics as well as was receiving IV vancomycin with dialysis. She denies any chest pains or shortness of breath has been having intermittent chills and shaking and jerking according to the daughter. Patient is non-mobile PAST MEDICAL HISTORY:   Past Medical History:  Diagnosis Date  . Anemia   . Arthritis    gout  . Atrial fibrillation (HCC)   . CHF (congestive heart failure) (HCC)   . Chronic kidney disease   . Diabetes mellitus without complication (HCC)   . Dialysis patient (HCC)   . Dysrhythmia   . GERD (gastroesophageal reflux disease)   . Hypertension   . Peripheral vascular disease (HCC)   . Pleural effusion   . Pulmonary hypertension (HCC)   . Renal insufficiency   . Restless leg syndrome   . Shortness of breath dyspnea    with exertion  . Stroke [facility](HCC)     PAST SURGICAL HISTORY: Past Surgical History:  Procedure Laterality Date  . AMPUTATION Right 11/07/2015    Procedure: AMPUTATION DIGIT ( 4TH TOE, RIGHT FOOT );  Surgeon: Annice NeedyJason S Dew, MD;  Location: [facility] ORS;  Service: Vascular;  Laterality: Right;  . AMPUTATION TOE Left 06/07/2015   Procedure: AMPUTATION TOE;  Surgeon: Gwyneth RevelsJustin Fowler, DPM;  Location: [facility] ORS;  Service: Podiatry;  Laterality: Left;  . CARDIAC CATHETERIZATION    . CATARACT EXTRACTION, BILATERAL    . CHOLECYSTECTOMY    . ENDARTERECTOMY Left 07/26/2015   Procedure: ENDARTERECTOMY CAROTID;  Surgeon: Renford DillsGregory G Schnier, MD;  Location: [facility] ORS;  Service: Vascular;  Laterality: Left;  . EYE SURGERY    . FISTULAGRAM ([facility] HX)    . JOINT REPLACEMENT     bilateral hip  . MEDTRONIC BLADDER INTERSTEM     currently turned off for MRI in January  . PARATHYROIDECTOMY    . PERIPHERAL VASCULAR CATHETERIZATION N/A 10/08/2014   Procedure: A/V Shuntogram/Fistulagram;  Surgeon: Annice NeedyJason S Dew, MD;  Location: [facility] INVASIVE CV LAB;  Service: Cardiovascular;  Laterality: N/A;  . PERIPHERAL VASCULAR CATHETERIZATION N/A 03/04/2015   Procedure: A/V Shuntogram/Fistulagram;  Surgeon: Annice NeedyJason S Dew, MD;  Location: [facility] INVASIVE CV LAB;  Service: Cardiovascular;  Laterality: N/A;  . PERIPHERAL VASCULAR CATHETERIZATION N/A 03/04/2015   Procedure: A/V Shunt Intervention;  Surgeon: Annice NeedyJason S Dew, MD;  Location: [facility] INVASIVE CV LAB;  Service: Cardiovascular;  Laterality: N/A;  . PERIPHERAL VASCULAR CATHETERIZATION N/A 09/16/2015   Procedure: Lower Extremity Angiography;  Surgeon:  Annice Needy, MD;  Location: [facility] INVASIVE CV LAB;  Service: Cardiovascular;  Laterality: N/A;  . PERIPHERAL VASCULAR CATHETERIZATION  09/16/2015   Procedure: Lower Extremity Intervention;  Surgeon: Annice Needy, MD;  Location: [facility] INVASIVE CV LAB;  Service: Cardiovascular;;  . THYROIDECTOMY, PARTIAL    . TONSILLECTOMY    . WOUND DEBRIDEMENT Right 11/07/2015   Procedure: DEBRIDEMENT WOUND ( RIGHT HEEL AND ABDOMINAL WOUND (2) );  Surgeon: Annice Needy, MD;  Location: [facility] ORS;  Service: Vascular;   Laterality: Right;  . WOUND DEBRIDEMENT N/A 11/28/2015   Procedure: DEBRIDEMENT WOUND ( ABDOMINAL );  Surgeon: Annice Needy, MD;  Location: [facility] ORS;  Service: Vascular;  Laterality: N/A;    SOCIAL HISTORY:  Social History  Substance Use Topics  . Smoking status: Never Smoker  . Smokeless tobacco: Never Used  . Alcohol use No    FAMILY HISTORY:  Family History  Problem Relation Age of Onset  . Colon cancer Mother   . Stroke Father   . Diabetes Brother     DRUG ALLERGIES:  Allergies  Allergen Reactions  . Codeine Other (See Comments)    Reaction:  Confusion/Hallucinations   . Contrast Media [Iodinated Diagnostic Agents] Other (See Comments)    "skin peel"  . Oxycodone Other (See Comments)    Reaction:  Confusion/Hallucinations   . Quinine Derivatives Rash    REVIEW OF SYSTEMS:   CONSTITUTIONAL: No fever, fatigue or weakness.  EYES: No blurred or double vision.  EARS, NOSE, AND THROAT: No tinnitus or ear pain.  RESPIRATORY: No cough, shortness of breath, wheezing or hemoptysis.  CARDIOVASCULAR: No chest pain, orthopnea, edema.  GASTROINTESTINAL: No nausea, vomiting, diarrhea or abdominal pain.  GENITOURINARY: No dysuria, hematuria.  ENDOCRINE: No polyuria, nocturia,  HEMATOLOGY: No anemia, easy bruising or bleeding SKIN: Open wound in the right posterior aspect of the leg MUSCULOSKELETAL: No joint pain or arthritis.   NEUROLOGIC: No tingling, numbness, weakness.  PSYCHIATRY: No anxiety or depression.   MEDICATIONS AT HOME:  Prior to Admission medications   Medication Sig Start Date End Date Taking? Authorizing Provider  acetaminophen (TYLENOL) 650 MG CR tablet Take 1,300 mg by mouth 2 (two) times daily.   Yes Historical Provider, MD  Biotin 1 MG CAPS Take 1 mg by mouth daily.    Yes Historical Provider, MD  calcium acetate (PHOSLO) 667 MG tablet Take 1,334 mg by mouth 3 (three) times daily with meals. With any food   Yes Historical Provider, MD  diltiazem (TIAZAC)  120 MG 24 hr capsule Take 120 mg by mouth every morning.    Yes Historical Provider, MD  doxycycline (VIBRA-TABS) 100 MG tablet Take 100 mg by mouth 2 (two) times daily. 02/05/16  Yes Historical Provider, MD  folic acid-vitamin b complex-vitamin c-selenium-zinc (DIALYVITE) 3 MG TABS tablet Take 1 tablet by mouth daily.   Yes Historical Provider, MD  furosemide (LASIX) 80 MG tablet Take 80 mg by mouth 2 (two) times daily.   Yes Historical Provider, MD  gabapentin (NEURONTIN) 100 MG capsule Take 100 mg by mouth at bedtime. Pt takes on Sunday,  Tuesday  ,Thursday and Saturday   Yes Historical Provider, MD  insulin detemir (LEVEMIR) 100 UNIT/ML injection Inject 0.11 mLs (11 Units total) into the skin daily. Patient taking differently: Inject 11 Units into the skin 2 (two) times daily.  10/15/15  Yes Katha Hamming, MD  lidocaine-prilocaine (EMLA) cream Apply 1 application topically as needed (prior to accessing port).  Yes Historical Provider, MD  Omega-3 Fatty Acids (FISH OIL) 1200 MG CAPS Take 1,200 mg by mouth at bedtime.    Yes Historical Provider, MD  PARoxetine (PAXIL) 40 MG tablet Take 40 mg by mouth at bedtime.    Yes Historical Provider, MD  ranitidine (ZANTAC) 150 MG tablet Take 150 mg by mouth at bedtime.    Yes Historical Provider, MD  warfarin (COUMADIN) 2 MG tablet Take 1 tablet (2 mg total) by mouth daily. 10/15/15  Yes Katha Hamming, MD  albuterol (PROVENTIL HFA;VENTOLIN HFA) 108 (90 Base) MCG/ACT inhaler Inhale 1-2 puffs into the lungs every 6 (six) hours as needed for wheezing or shortness of breath.    Historical Provider, MD  gabapentin (NEURONTIN) 300 MG capsule Take 300 mg by mouth at bedtime. Pt takes on  Monday, Wednesday, and Friday after dialysis.    Historical Provider, MD  pravastatin (PRAVACHOL) 20 MG tablet Take 1 tablet (20 mg total) by mouth daily. Patient taking differently: Take 20 mg by mouth at bedtime.  07/27/15   Kimberly A Stegmayer, PA-C  traMADol (ULTRAM)  50 MG tablet Take 1 tablet (50 mg total) by mouth every 6 (six) hours as needed. 11/28/15   Annice Needy, MD      PHYSICAL EXAMINATION:   VITAL SIGNS: Blood pressure (!) 124/59, pulse 82, temperature 98 F (36.7 C), temperature source Oral, resp. rate 16, height 5\' 5"  (1.651 m), weight 160 lb (72.6 kg), SpO2 100 %.  GENERAL:  82 y.o.-year-old patient lying in the bed with no acute distress.  EYES: Pupils equal, round, reactive to light and accommodation. No scleral icterus. Extraocular muscles intact.  HEENT: Head atraumatic, normocephalic. Oropharynx and nasopharynx clear.  NECK:  Supple, no jugular venous distention. No thyroid enlargement, no tenderness.  LUNGS: Normal breath sounds bilaterally, no wheezing, rales,rhonchi or crepitation. No use of accessory muscles of respiration.  CARDIOVASCULAR: S1, S2 normal. No murmurs, rubs, or gallops.  ABDOMEN: Soft, nontender, nondistended. Bowel sounds present. No organomegaly or mass.  EXTREMITIES: In the area of the Achilles tendon there is a large ulcer with purulent material. NEUROLOGIC: Cranial nerves II through XII are intact. Muscle strength 5/5 in all extremities. Sensation intact. Gait not checked.  PSYCHIATRIC: The patient is alert and oriented x 3.  SKIN: No obvious rash, lesion, or ulcer.   LABORATORY PANEL:   CBC  Recent Labs Lab 02/14/16 1601  WBC 9.3  HGB 10.2*  HCT 31.2*  PLT 263  MCV 89.5  MCH 29.3  MCHC 32.8  RDW 20.0*   ------------------------------------------------------------------------------------------------------------------  Chemistries   Recent Labs Lab 02/14/16 1601  NA 136  K 3.6  CL 98*  CO2 24  GLUCOSE 174*  BUN 43*  CREATININE 3.90*  CALCIUM 8.2*  AST 39  ALT 21  ALKPHOS 82  BILITOT 0.4   ------------------------------------------------------------------------------------------------------------------ estimated creatinine clearance is 11.5 mL/min (by C-G formula based on SCr of 3.9  mg/dL (H)). ------------------------------------------------------------------------------------------------------------------ No results for input(s): TSH, T4TOTAL, T3FREE, THYROIDAB in the last 72 hours.  Invalid input(s): FREET3   Coagulation profile No results for input(s): INR, PROTIME in the last 168 hours. ------------------------------------------------------------------------------------------------------------------- No results for input(s): DDIMER in the last 72 hours. -------------------------------------------------------------------------------------------------------------------  Cardiac Enzymes No results for input(s): CKMB, TROPONINI, MYOGLOBIN in the last 168 hours.  Invalid input(s): CK ------------------------------------------------------------------------------------------------------------------ Invalid input(s): POCBNP  ---------------------------------------------------------------------------------------------------------------  Urinalysis No results found for: COLORURINE, APPEARANCEUR, LABSPEC, PHURINE, GLUCOSEU, HGBUR, BILIRUBINUR, KETONESUR, PROTEINUR, UROBILINOGEN, NITRITE, LEUKOCYTESUR   RADIOLOGY: Ct Ankle Right W Contrast  Result Date: 02/13/2016 CLINICAL DATA:  Nonhealing ulcer.  Location not stated. EXAM: CT OF THE RIGHT ANKLE WITH CONTRAST TECHNIQUE: Multidetector CT imaging of the right ankle was performed following the standard protocol during bolus administration of intravenous contrast. CONTRAST:  ISOVUE-300 IOPAMIDOL (ISOVUE-300) INJECTION 61% COMPARISON:  Radiographs 05/22/2015 FINDINGS: Marked abnormal soft tissue thickening along the entire posterior aspect of the ankle and hindfoot with destruction of the posterior aspect of the calcaneus consistent with osteomyelitis. The Achilles tendon is likely destroyed by infection also. Soft tissue ulceration noted posteriorly at the level of the superior aspect of the calcaneus. Suspect a small  soft tissue abscess measuring 20 x 8.5 mm on series 8, image 112. Diffuse cellulitis. Severe midfoot degenerative changes and partial auto fusion. Findings consistent with a neuropathic foot. Moderate tibiotalar degenerative changes also but no definite findings for septic arthritis or osteomyelitis involving the ankle. Extensive small vessel calcifications. IMPRESSION: 1. Diffuse enhancing thickened soft tissues along the entire posterior aspect of the hindfoot and ankle with an open ulcer. Suspect a small abscess measuring 20 x 8.5 mm adjacent to the upper calcaneus. 2. 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 3. Likely destroyed Achilles tendon. 4. Neuropathic changes involving the midfoot. No definite findings for septic arthritis. Electronically Signed   By: Rudie Meyer M.D.   On: 02/13/2016 10:03    EKG: Orders placed or performed during the hospital encounter of 09/14/15  . ED EKG  . ED EKG  . EKG 12-Lead  . EKG 12-Lead    IMPRESSION AND PLAN: Patient is a 82 year old white female with multiple medical problems  1. Right leg osteomyelitis Has been seen by vascular was performed procedures on her. We'll ask them to come see the ED physician has artery talk to them on the phone I will place patient on vancomycin and Zosyn also ask infectious disease to see  2. End-stage renal disease nephrology consult for dialysis  3. Diabetes type 2 Patient will be on sliding scale will continue her home insulin  4. Atrial fibrillation we'll continue diltiazem and Coumadin    5. Hyperlipidemia continue therapy with pravastatin  6. Miscellaneous patient already on Coumadin for DVT prophylaxis      All the records are reviewed and case discussed with ED provider. Management plans discussed with the patient, family and they are in agreement.  CODE STATUS:dnr    Code Status Orders        Start     Ordered   02/14/16 2032  Do not attempt resuscitation  (DNR)  Continuous    Question Answer Comment  In the event of cardiac or respiratory ARREST Do not call a "code blue"   In the event of cardiac or respiratory ARREST Do not perform Intubation, CPR, defibrillation or ACLS   In the event of cardiac or respiratory ARREST Use medication by any route, position, wound care, and other measures to relive pain and suffering. May use oxygen, suction and manual treatment of airway obstruction as needed for comfort.      02/14/16 2031    Code Status History    Date Active Date Inactive Code Status Order ID Comments User Context   02/14/2016  8:31 PM 02/14/2016  8:31 PM Full Code [id number]  Auburn Bilberry, MD ED   10/12/2015  5:49 PM 10/15/2015  3:46 PM DNR [id number]  Altamese Dilling, MD Inpatient   09/14/2015 10:46 PM 09/18/2015  8:40 PM Full Code [id number]  Marguarite Arbour, MD Inpatient  08/13/2015  1:01 PM 08/16/2015  4:09 PM Full Code [id number]  Altamese Dilling, MD Inpatient   06/18/2015 11:41 PM 06/25/2015  1:04 AM DNR [id number]  Ramonita Lab, MD Inpatient   06/07/2015  3:46 PM 06/07/2015  6:48 PM Full Code [id number]  Gwyneth Revels, DPM Inpatient   12/11/2014 10:46 PM 12/13/2014  9:38 PM DNR [id number]  Enedina Finner, MD Inpatient   12/11/2014  9:10 PM 12/11/2014 10:46 PM DNR [id number]  Enedina Finner, MD ED       TOTAL TIME TAKING CARE OF THIS PATIENT: .    Auburn Bilberry M.D on 02/14/2016 at 8:35 PM  Between 7am to 6pm - Pager - [phone]  After 6pm go to [email or web address] - password [email or web address]  [facility]  Office  [phone]  CC: Primary care physician; [name], MD

## 2016-02-14 NOTE — Progress Notes (Signed)
PHARMACIST - PHYSICIAN ORDER COMMUNICATION  CONCERNING: P&T Medication Policy on Herbal Medications  DESCRIPTION:  This patient's order for:  Biotin  has been noted.  This product(s) is classified as an "herbal" or natural product. Due to a lack of definitive safety studies or FDA approval, nonstandard manufacturing practices, plus the potential risk of unknown drug-drug interactions while on inpatient medications, the Pharmacy and Therapeutics Committee does not permit the use of "herbal" or natural products of this type within [place].   ACTION TAKEN: The pharmacy department is unable to verify this order at this time Please reevaluate patient's clinical condition at discharge and address if the herbal or natural product(s) should be resumed at that time.    

## 2016-02-14 NOTE — ED Triage Notes (Signed)
Pt sent over for further eval of wound on right root. Pt currently being tx at the wound clinic and possible amputation of right foot has been discussed. Pt just had dialysis today.

## 2016-02-14 NOTE — ED Notes (Signed)
Floor called for pending pt

## 2016-02-14 NOTE — ED Notes (Signed)
Report received from Angela, RN 

## 2016-02-15 LAB — GLUCOSE, CAPILLARY
GLUCOSE-CAPILLARY: 153 mg/dL — AB (ref 65–99)
GLUCOSE-CAPILLARY: 115 mg/dL — AB (ref 65–99)
Glucose-Capillary: 116 mg/dL — ABNORMAL HIGH (ref 65–99)

## 2016-02-15 LAB — BASIC METABOLIC PANEL
ANION GAP: 12 (ref 5–15)
BUN: 55 mg/dL — ABNORMAL HIGH (ref 6–20)
CALCIUM: 7.7 mg/dL — AB (ref 8.9–10.3)
CO2: 24 mmol/L (ref 22–32)
CREATININE: 4.79 mg/dL — AB (ref 0.44–1.00)
Chloride: 102 mmol/L (ref 101–111)
GFR, EST AFRICAN AMERICAN: 9 mL/min — AB (ref >60)
GFR, EST NON AFRICAN AMERICAN: 8 mL/min — AB (ref >60)
Glucose, Bld: 61 mg/dL — ABNORMAL LOW (ref 65–99)
Potassium: 4.1 mmol/L (ref 3.5–5.1)
SODIUM: 138 mmol/L (ref 135–145)

## 2016-02-15 LAB — CBC
HEMATOCRIT: 28.2 % — AB (ref 35.0–47.0)
Hemoglobin: 9.4 g/dL — ABNORMAL LOW (ref 12.0–16.0)
MCH: 30.2 pg (ref 26.0–34.0)
MCHC: 33.4 g/dL (ref 32.0–36.0)
MCV: 90.6 fL (ref 80.0–100.0)
Platelets: 243 10*3/uL (ref 150–440)
RBC: 3.11 MIL/uL — ABNORMAL LOW (ref 3.80–5.20)
RDW: 19.5 % — AB (ref 11.5–14.5)
WBC: 8 10*3/uL (ref 3.6–11.0)

## 2016-02-15 LAB — PROTIME-INR
INR: 2.33
PROTHROMBIN TIME: 26 s — AB (ref 11.4–15.2)

## 2016-02-15 LAB — AEROBIC CULTURE W GRAM STAIN (SUPERFICIAL SPECIMEN)

## 2016-02-15 LAB — AEROBIC CULTURE  (SUPERFICIAL SPECIMEN)

## 2016-02-15 MED ORDER — VANCOMYCIN HCL 500 MG IV SOLR
500.0000 mg | Freq: Once | INTRAVENOUS | Status: AC
  Administered 2016-02-15: 500 mg via INTRAVENOUS
  Filled 2016-02-15: qty 500

## 2016-02-15 MED ORDER — PIPERACILLIN-TAZOBACTAM 3.375 G IVPB
3.3750 g | Freq: Two times a day (BID) | INTRAVENOUS | Status: DC
  Administered 2016-02-15 – 2016-02-17 (×6): 3.375 g via INTRAVENOUS
  Filled 2016-02-15 (×6): qty 50

## 2016-02-15 MED ORDER — VANCOMYCIN HCL IN DEXTROSE 750-5 MG/150ML-% IV SOLN
750.0000 mg | INTRAVENOUS | Status: DC
  Administered 2016-02-17: 750 mg via INTRAVENOUS
  Filled 2016-02-15 (×2): qty 150

## 2016-02-15 MED ORDER — INSULIN ASPART 100 UNIT/ML ~~LOC~~ SOLN
0.0000 [IU] | Freq: Three times a day (TID) | SUBCUTANEOUS | Status: DC
  Administered 2016-02-15 – 2016-02-17 (×3): 2 [IU] via SUBCUTANEOUS
  Filled 2016-02-15 (×2): qty 2

## 2016-02-15 MED ORDER — INSULIN ASPART 100 UNIT/ML ~~LOC~~ SOLN
0.0000 [IU] | Freq: Every day | SUBCUTANEOUS | Status: DC
Start: 2016-02-15 — End: 2016-02-17
  Administered 2016-02-16: 2 [IU] via SUBCUTANEOUS
  Filled 2016-02-15 (×2): qty 2

## 2016-02-15 NOTE — Consult Note (Signed)
Patient Demographics  [name], is a 82 y.o. female   MRN: [id number]   DOB - 04-07-1934  Admit Date - 02/14/2016    Outpatient Primary MD for the patient is [name], MD  Consult requested in the Hospital by Adrian Saran, MD, On 02/15/2016    Reason for consult patient has a long history of ulceration to the posterior right heel. Has been followed by the wound care clinic here in [place]. Apparently she has multiple wounds on her abdomen's her arms left heels had a wound that apparently is stable the right heel has been treated with dressings cleaning and a wound VAC but continues to show problems with drainage and nonhealing. CT scan done yesterday showed erosive changes to the posterior calcaneus consistent with possible osteomyelitis.   With History of -  Past Medical History:  Diagnosis Date  . Anemia   . Arthritis    gout  . Atrial fibrillation (HCC)   . CHF (congestive heart failure) (HCC)   . Chronic kidney disease   . Diabetes mellitus without complication (HCC)   . Dialysis patient (HCC)   . Dysrhythmia   . GERD (gastroesophageal reflux disease)   . Hypertension   . Peripheral vascular disease (HCC)   . Pleural effusion   . Pulmonary hypertension (HCC)   . Renal insufficiency   . Restless leg syndrome   . Shortness of breath dyspnea    with exertion  . Stroke [facility])       Past Surgical History:  Procedure Laterality Date  . AMPUTATION Right 11/07/2015   Procedure: AMPUTATION DIGIT ( 4TH TOE, RIGHT FOOT );  Surgeon: Annice Needy, MD;  Location: [facility] ORS;  Service: Vascular;  Laterality: Right;  . AMPUTATION TOE Left 06/07/2015   Procedure: AMPUTATION TOE;  Surgeon: Gwyneth Revels, DPM;  Location: [facility] ORS;  Service: Podiatry;  Laterality: Left;  . CARDIAC CATHETERIZATION    . CATARACT  EXTRACTION, BILATERAL    . CHOLECYSTECTOMY    . ENDARTERECTOMY Left 07/26/2015   Procedure: ENDARTERECTOMY CAROTID;  Surgeon: Renford Dills, MD;  Location: [facility] ORS;  Service: Vascular;  Laterality: Left;  . EYE SURGERY    . FISTULAGRAM ([facility] HX)    . JOINT REPLACEMENT     bilateral hip  . MEDTRONIC BLADDER INTERSTEM     currently turned off for MRI in January  . PARATHYROIDECTOMY    . PERIPHERAL VASCULAR CATHETERIZATION N/A 10/08/2014   Procedure: A/V Shuntogram/Fistulagram;  Surgeon: Annice Needy, MD;  Location: [facility] INVASIVE CV LAB;  Service: Cardiovascular;  Laterality: N/A;  . PERIPHERAL VASCULAR CATHETERIZATION N/A 03/04/2015   Procedure: A/V Shuntogram/Fistulagram;  Surgeon: Annice Needy, MD;  Location: [facility] INVASIVE CV LAB;  Service: Cardiovascular;  Laterality: N/A;  . PERIPHERAL VASCULAR CATHETERIZATION N/A 03/04/2015   Procedure: A/V Shunt Intervention;  Surgeon: Annice Needy, MD;  Location: [facility] INVASIVE CV LAB;  Service: Cardiovascular;  Laterality: N/A;  . PERIPHERAL VASCULAR CATHETERIZATION N/A 09/16/2015   Procedure: Lower Extremity Angiography;  Surgeon: Annice Needy, MD;  Location: [facility] INVASIVE CV LAB;  Service: Cardiovascular;  Laterality: N/A;  . PERIPHERAL VASCULAR CATHETERIZATION  09/16/2015   Procedure: Lower Extremity Intervention;  Surgeon: Annice Needy, MD;  Location: [facility] INVASIVE CV LAB;  Service: Cardiovascular;;  . THYROIDECTOMY, PARTIAL    . TONSILLECTOMY    . WOUND DEBRIDEMENT Right 11/07/2015   Procedure: DEBRIDEMENT WOUND ( RIGHT HEEL AND ABDOMINAL WOUND (2) );  Surgeon: Annice Needy, MD;  Location: [facility] ORS;  Service: Vascular;  Laterality: Right;  . WOUND DEBRIDEMENT N/A 11/28/2015   Procedure: DEBRIDEMENT WOUND ( ABDOMINAL );  Surgeon: Annice Needy, MD;  Location: [facility] ORS;  Service: Vascular;  Laterality: N/A;    in for   Chief Complaint  Patient presents with  . Wound Infection     HPI  [name]  is a 82 y.o. female, Chronic history of wound to  the right heel as mentioned and the reason for consult.  Social History Social History  Substance Use Topics  . Smoking status: Never Smoker  . Smokeless tobacco: Never Used  . Alcohol use No     Family History Family History  Problem Relation Age of Onset  . Colon cancer Mother   . Stroke Father   . Diabetes Brother      Prior to Admission medications   Medication Sig Start Date End Date Taking? Authorizing Provider  acetaminophen (TYLENOL) 650 MG CR tablet Take 1,300 mg by mouth 2 (two) times daily.   Yes Historical Provider, MD  Biotin 1 MG CAPS Take 1 mg by mouth daily.    Yes Historical Provider, MD  calcium acetate (PHOSLO) 667 MG tablet Take 1,334 mg by mouth 3 (three) times daily with meals. With any food   Yes Historical Provider, MD  diltiazem (TIAZAC) 120 MG 24 hr capsule Take 120 mg by mouth every morning.    Yes Historical Provider, MD  doxycycline (VIBRA-TABS) 100 MG tablet Take 100 mg by mouth 2 (two) times daily. 02/05/16  Yes Historical Provider, MD  folic acid-vitamin b complex-vitamin c-selenium-zinc (DIALYVITE) 3 MG TABS tablet Take 1 tablet by mouth daily.   Yes Historical Provider, MD  furosemide (LASIX) 80 MG tablet Take 80 mg by mouth 2 (two) times daily.   Yes Historical Provider, MD  gabapentin (NEURONTIN) 100 MG capsule Take 100 mg by mouth at bedtime. Pt takes on Sunday,  Tuesday  ,Thursday and Saturday   Yes Historical Provider, MD  insulin detemir (LEVEMIR) 100 UNIT/ML injection Inject 0.11 mLs (11 Units total) into the skin daily. Patient taking differently: Inject 11 Units into the skin 2 (two) times daily.  10/15/15  Yes Katha Hamming, MD  lidocaine-prilocaine (EMLA) cream Apply 1 application topically as needed (prior to accessing port).    Yes Historical Provider, MD  Omega-3 Fatty Acids (FISH OIL) 1200 MG CAPS Take 1,200 mg by mouth at bedtime.    Yes Historical Provider, MD  PARoxetine (PAXIL) 40 MG tablet Take 40 mg by mouth at bedtime.     Yes Historical Provider, MD  ranitidine (ZANTAC) 150 MG tablet Take 150 mg by mouth at bedtime.    Yes Historical Provider, MD  warfarin (COUMADIN) 2 MG tablet Take 1 tablet (2 mg total) by mouth daily. 10/15/15  Yes Katha Hamming, MD  albuterol (PROVENTIL HFA;VENTOLIN HFA) 108 (90 Base) MCG/ACT inhaler Inhale 1-2 puffs into the lungs every 6 (six) hours as needed for wheezing or shortness of breath.    Historical Provider, MD  gabapentin (NEURONTIN) 300 MG capsule Take 300 mg by mouth at bedtime. Pt takes on  Monday, Wednesday, and Friday after dialysis.    Historical Provider,  MD  pravastatin (PRAVACHOL) 20 MG tablet Take 1 tablet (20 mg total) by mouth daily. Patient taking differently: Take 20 mg by mouth at bedtime.  07/27/15   Kimberly A Stegmayer, PA-C  traMADol (ULTRAM) 50 MG tablet Take 1 tablet (50 mg total) by mouth every 6 (six) hours as needed. 11/28/15   Annice Needy, MD    Anti-infectives    Start     Dose/Rate Route Frequency Ordered Stop   02/17/16 1200  vancomycin (VANCOCIN) IVPB 750 mg/150 ml premix     750 mg 150 mL/hr over 60 Minutes Intravenous Every M-W-F (Hemodialysis) 02/15/16 0113     02/15/16 0800  piperacillin-tazobactam (ZOSYN) IVPB 3.375 g     3.375 g 12.5 mL/hr over 240 Minutes Intravenous Every 12 hours 02/15/16 0113     02/15/16 0115  vancomycin (VANCOCIN) 500 mg in sodium chloride 0.9 % 100 mL IVPB     500 mg 100 mL/hr over 60 Minutes Intravenous  Once 02/15/16 0113 02/15/16 0310   02/14/16 2015  vancomycin (VANCOCIN) IVPB 1000 mg/200 mL premix     1,000 mg 200 mL/hr over 60 Minutes Intravenous  Once 02/14/16 2005 02/14/16 2151   02/14/16 1945  piperacillin-tazobactam (ZOSYN) IVPB 3.375 g     3.375 g 100 mL/hr over 30 Minutes Intravenous  Once 02/14/16 1934 02/14/16 2040   02/14/16 1945  vancomycin (VANCOCIN) injection 1,000 mg  Status:  Discontinued     1,000 mg Intravenous  Once 02/14/16 1934 02/14/16 2006      Scheduled Meds: . calcium acetate   1,334 mg Oral TID WC  . diltiazem  120 mg Oral BH-q7a  . famotidine  10 mg Oral Daily  . furosemide  80 mg Oral BID  . gabapentin  100 mg Oral Once per day on Sun Tue Thu Sat  . [START ON 02/17/2016] gabapentin  300 mg Oral Once per day on Mon Wed Fri  . insulin aspart  0-9 Units Subcutaneous TID WC  . insulin detemir  11 Units Subcutaneous BID  . multivitamin  1 tablet Oral Daily  . omega-3 acid ethyl esters  2 g Oral Daily  . PARoxetine  40 mg Oral QHS  . piperacillin-tazobactam (ZOSYN)  IV  3.375 g Intravenous Q12H  . pravastatin  20 mg Oral Daily  . sodium chloride flush  3 mL Intravenous Q12H  . [START ON 02/17/2016] vancomycin  750 mg Intravenous Q M,W,F-HD  . Warfarin - Physician Dosing Inpatient   Does not apply q1800   Continuous Infusions:  PRN Meds:.sodium chloride, acetaminophen **OR** acetaminophen, acetaminophen, albuterol, lidocaine-prilocaine, ondansetron **OR** ondansetron (ZOFRAN) IV, sodium chloride flush, traMADol  Allergies  Allergen Reactions  . Codeine Other (See Comments)    Reaction:  Confusion/Hallucinations   . Contrast Media [Iodinated Diagnostic Agents] Other (See Comments)    "skin peel"  . Oxycodone Other (See Comments)    Reaction:  Confusion/Hallucinations   . Quinine Derivatives Rash    Physical Exam: Patient is alert and well-oriented and is responsive to my questions. As well acquainted with her history.  Vitals  Blood pressure 130/60, pulse 88, temperature 98 F (36.7 C), temperature source Oral, resp. rate 19, height 5\' 5"  (1.651 m), weight 70.2 kg (154 lb 12.8 oz), SpO2 95 %.  Lower Extremity exam:  Vascular: DP pulses are nonpalpable bilateral with a history of vascular disease.  Dermatological: Right heel wound evaluated today is approximately 10 cm in length and 3 cm in width. There is some moist  necrotic tissue at the proximal and distal portions of the wound. There is no surrounding cellulitis at this point no evidence of progressing  cellulitis either distally or plantarly. Wound is likely through the dermis and epidermis but has granular tissue through 90% of it.  Neurological: Likely extensive peripheral neuropathy.  Ortho: Possible calcaneal osteomyelitis based on the CT scan. Patient had a history of Charcot arthropathy and has erosive changes throughout multiple portions of her foot based on the CT with cystic changes and also osteochondral defects and the ankle we're foot midfoot.  Data Review  CBC  Recent Labs Lab 02/14/16 1601 02/15/16 0420  WBC 9.3 8.0  HGB 10.2* 9.4*  HCT 31.2* 28.2*  PLT 263 243  MCV 89.5 90.6  MCH 29.3 30.2  MCHC 32.8 33.4  RDW 20.0* 19.5*   ------------------------------------------------------------------------------------------------------------------  Chemistries   Recent Labs Lab 02/14/16 1601 02/15/16 0420  NA 136 138  K 3.6 4.1  CL 98* 102  CO2 24 24  GLUCOSE 174* 61*  BUN 43* 55*  CREATININE 3.90* 4.79*  CALCIUM 8.2* 7.7*  AST 39  --   ALT 21  --   ALKPHOS 82  --   BILITOT 0.4  --    --------------------------------------------------------------------------------- Assessment & Plan: Patient's had long history of diabetes diabetic neuropathy and ulcerations. She's had amputations on toes on each foot one side by Dr. Ether GriffinsFowler once by Dr. dew. The patient is also been followed by Dr. Roxan Hockeyobinson at the [facility]. The wound has failed to progress and possibly has osteomyelitis in the heel uncertain as to the degree of Achilles tendon damage. I asked the patient about her ambulatory capacity and she does not walk she only pivots whenever she is transferring to a chair or wheelchair. Plan: She doesn't have any gross signs of infection at this point there is some necrotic tissue but there is no extensive cellulitis. I will ask Dr. dew to see her on Monday and find out her current vascular status. Is a possibility she may need further debridement  including resection of the calcaneus if indeed it appears to be osteomyelitic. I will not progress with anything until Dr. dew evaluates the patient. Patient gets dialysis on Monday Wednesday and Friday. She also has lesions and ulcerations on her abdomen arms and stable lesion on her left heel  Active Problems:   Osteomyelitis [facility](HCC)     Family Communication: Plan discussed with patient and **   Thank you for the consult, we will follow the patient with you in the Hospital.   Epimenio SarinROXLER,Monna Crean G M.D on 02/15/2016 at 10:35 AM  Thank you for the consult, we will follow the patient with you in the Hospital.

## 2016-02-15 NOTE — Progress Notes (Signed)
[facility] [place] [facility]   PATIENT NAME: [name]    MR#:  [id number]  DATE OF BIRTH:  Nov 16, 1933  SUBJECTIVE:   Patient Here with right ankle abscess/osteomyelitis of the calcaneus  REVIEW OF SYSTEMS:    Review of Systems  Constitutional: Negative.  Negative for chills, fever and malaise/fatigue.  HENT: Negative.  Negative for ear discharge, ear pain, hearing loss, nosebleeds and sore throat.   Eyes: Negative.  Negative for blurred vision and pain.  Respiratory: Negative.  Negative for cough, hemoptysis, shortness of breath and wheezing.   Cardiovascular: Negative.  Negative for chest pain, palpitations and leg swelling.  Gastrointestinal: Negative.  Negative for abdominal pain, blood in stool, diarrhea, nausea and vomiting.  Genitourinary: Negative.  Negative for dysuria.  Musculoskeletal: Negative.  Negative for back pain.  Skin: Negative.        Wound right lower extremity/Achilles tendon\ Multiple abdominal ulcerations Left lower calcaneus wound  Neurological: Negative for dizziness, tremors, speech change, focal weakness, seizures and headaches.  Endo/Heme/Allergies: Negative.  Does not bruise/bleed easily.  Psychiatric/Behavioral: Negative.  Negative for depression, hallucinations and suicidal ideas.    Tolerating Diet:yes      DRUG ALLERGIES:   Allergies  Allergen Reactions  . Codeine Other (See Comments)    Reaction:  Confusion/Hallucinations   . Contrast Media [Iodinated Diagnostic Agents] Other (See Comments)    "skin peel"  . Oxycodone Other (See Comments)    Reaction:  Confusion/Hallucinations   . Quinine Derivatives Rash    VITALS:  Blood pressure 130/60, pulse 88, temperature 98 F (36.7 C), temperature source Oral, resp. rate 19, height 5\' 5"  (1.651 m), weight 70.2 kg (154 lb 12.8 oz), SpO2 95 %.  PHYSICAL EXAMINATION:   Physical Exam  Constitutional: She is oriented to person, place, and time and well-developed,  well-nourished, and in no distress. No distress.  HENT:  Head: Normocephalic.  Eyes: No scleral icterus.  Neck: Normal range of motion. Neck supple. No JVD present. No tracheal deviation present.  Cardiovascular: Normal heart sounds.  Exam reveals no gallop and no friction rub.   No murmur heard. Irr, reg  Pulmonary/Chest: Effort normal and breath sounds normal. No respiratory distress. She has no wheezes. She has no rales. She exhibits no tenderness.  Abdominal: Soft. Bowel sounds are normal. She exhibits no distension and no mass. There is no tenderness. There is no rebound and no guarding.  Musculoskeletal: Normal range of motion. She exhibits no edema.  Neurological: She is alert and oriented to person, place, and time.  Skin: Skin is warm. No rash noted. No erythema.  Patient with multiple ulcerations in her abdomen which appear well healing, left lower extremity and RLE covered but foul smelling  Psychiatric: Affect and judgment normal.      LABORATORY PANEL:   CBC  Recent Labs Lab 02/15/16 0420  WBC 8.0  HGB 9.4*  HCT 28.2*  PLT 243   ------------------------------------------------------------------------------------------------------------------  Chemistries   Recent Labs Lab 02/14/16 1601 02/15/16 0420  NA 136 138  K 3.6 4.1  CL 98* 102  CO2 24 24  GLUCOSE 174* 61*  BUN 43* 55*  CREATININE 3.90* 4.79*  CALCIUM 8.2* 7.7*  AST 39  --   ALT 21  --   ALKPHOS 82  --   BILITOT 0.4  --    ------------------------------------------------------------------------------------------------------------------  Cardiac Enzymes No results for input(s): TROPONINI in the last 168 hours. ------------------------------------------------------------------------------------------------------------------  RADIOLOGY:  No results found.   ASSESSMENT  AND PLAN:   82 year old female with a history of severe peripheral vascular disease, diabetes, ESRD on hemodialysis,  atrial fibrillation who presents for evaluation of an ulceration of her right lower extremity and found to have osteomyelitis of calcaneus with a small abscess.  1. 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 Orland Jarred. Vascular surgery and ID consult pending. Continue Zosyn and vancomycin. Likely will need debridement of abscess and evaluation for osteomyelitis.   2. Abdominal wounds: These appear well healed. Wound care consult.  3. ESRD and hematemesis: Patient will require doses while in the hospital.  4. Peripheral vascular disease: Follow-up on vascular surgery consult Continue pravastatin.  5. Atrial fibrillation/atrial flutter chronic: Coumadin as per pharmacy dosing. Continue diltiazem for rate control.  6. Diabetes: Last A1c 6.6   Continue ADA diet, sliding scale insulin and Levemir. Diabetes coordinator consult place.  7. Depression: Continue Paxil   Management plans discussed with the patient and she is in agreement. Rounded with nursing  CODE STATUS: full  TOTAL TIME TAKING CARE OF THIS PATIENT: 31 minutes.     POSSIBLE D/C 3 days, DEPENDING ON CLINICAL CONDITION.   Rainee Sweatt M.D on 02/15/2016 at 10:43 AM  Between 7am to 6pm - Pager - [phone] After 6pm go to [email or web address] - password [email or web address]  [facility] [place] [facility]  Office  [phone]  CC: Primary care physician; [name], MD  Note: This dictation was prepared with Dragon dictation along with smaller phrase technology. Any transcriptional errors that result from this process are unintentional.

## 2016-02-15 NOTE — Progress Notes (Signed)
[facility]  ROUNDING NOTE   Subjective:   Admitted on 02/14/2016 for Acute osteomyelitis of right ankle (HCC) [M86.171]   Objective:  Vital signs in last 24 hours:  Temp:  [98 F (36.7 C)-99 F (37.2 C)] 99 F (37.2 C) (09/16 1030) Pulse Rate:  [82-88] 83 (09/16 1030) Resp:  [16-20] 20 (09/16 1030) BP: (109-132)/(53-64) 109/53 (09/16 1030) SpO2:  [95 %-100 %] 97 % (09/16 1030) Weight:  [70.2 kg (154 lb 12.8 oz)-72.6 kg (160 lb)] 70.2 kg (154 lb 12.8 oz) (09/16 0431)  Weight change:  Filed Weights   02/14/16 1549 02/14/16 2219 02/15/16 0431  Weight: 72.6 kg (160 lb) 70.2 kg (154 lb 12.8 oz) 70.2 kg (154 lb 12.8 oz)    Intake/Output: No intake/output data recorded.   Intake/Output this shift:  Total I/O In: 240 [P.O.:240] Out: -   Physical Exam: General: NAD,   Head: Normocephalic, atraumatic. Moist oral mucosal membranes  Eyes: Anicteric, PERRL  Neck: Supple, trachea midline  Lungs:  Clear to auscultation  Heart: Regular rate and rhythm  Abdomen:  Soft, nontender,   Extremities:  right calcaneus ulcer  Neurologic: Nonfocal, moving all four extremities  Skin: No lesions  Access: Right AVF    Basic Metabolic Panel:  Recent Labs Lab 02/14/16 1601 02/15/16 0420  NA 136 138  K 3.6 4.1  CL 98* 102  CO2 24 24  GLUCOSE 174* 61*  BUN 43* 55*  CREATININE 3.90* 4.79*  CALCIUM 8.2* 7.7*    Liver Function Tests:  Recent Labs Lab 02/14/16 1601  AST 39  ALT 21  ALKPHOS 82  BILITOT 0.4  PROT 8.2*  ALBUMIN 3.3*   No results for input(s): LIPASE, AMYLASE in the last 168 hours. No results for input(s): AMMONIA in the last 168 hours.  CBC:  Recent Labs Lab 02/14/16 1601 02/15/16 0420  WBC 9.3 8.0  HGB 10.2* 9.4*  HCT 31.2* 28.2*  MCV 89.5 90.6  PLT 263 243    Cardiac Enzymes: No results for input(s): CKTOTAL, CKMB, CKMBINDEX, TROPONINI in the last 168 hours.  BNP: Invalid input(s): POCBNP  CBG:  Recent Labs Lab  02/14/16 2223 02/15/16 1129  GLUCAP 89 153*    Microbiology: Results for orders placed or performed during the hospital encounter of 02/14/16  Blood culture (routine x 2)     Status: None (Preliminary result)   Collection Time: 02/14/16  8:06 PM  Result Value Ref Range Status   Specimen Description BLOOD LEFT HAND  Final   Special Requests BOTTLES DRAWN AEROBIC AND ANAEROBIC 7CC  Final   Culture NO GROWTH < 12 HOURS  Final   Report Status PENDING  Incomplete  Blood culture (routine x 2)     Status: None (Preliminary result)   Collection Time: 02/14/16  8:06 PM  Result Value Ref Range Status   Specimen Description BLOOD LEFT WRIST  Final   Special Requests BOTTLES DRAWN AEROBIC AND ANAEROBIC 7CC  Final   Culture NO GROWTH < 12 HOURS  Final   Report Status PENDING  Incomplete    Coagulation Studies:  Recent Labs  02/14/16 2201 02/15/16 0420  LABPROT 27.9* 26.0*  INR 2.55 2.33    Urinalysis: No results for input(s): COLORURINE, LABSPEC, PHURINE, GLUCOSEU, HGBUR, BILIRUBINUR, KETONESUR, PROTEINUR, UROBILINOGEN, NITRITE, LEUKOCYTESUR in the last 72 hours.  Invalid input(s): APPERANCEUR    Imaging: No results found.   Medications:     . calcium acetate  1,334 mg Oral TID WC  . diltiazem  120 mg Oral BH-q7a  . famotidine  10 mg Oral Daily  . furosemide  80 mg Oral BID  . gabapentin  100 mg Oral Once per day on Sun Tue Thu Sat  . [START ON 02/17/2016] gabapentin  300 mg Oral Once per day on Mon Wed Fri  . insulin aspart  0-5 Units Subcutaneous QHS  . insulin aspart  0-9 Units Subcutaneous TID WC  . insulin detemir  11 Units Subcutaneous BID  . multivitamin  1 tablet Oral Daily  . omega-3 acid ethyl esters  2 g Oral Daily  . PARoxetine  40 mg Oral QHS  . piperacillin-tazobactam (ZOSYN)  IV  3.375 g Intravenous Q12H  . pravastatin  20 mg Oral Daily  . sodium chloride flush  3 mL Intravenous Q12H  . [START ON 02/17/2016] vancomycin  750 mg Intravenous Q M,W,F-HD  .  Warfarin - Physician Dosing Inpatient   Does not apply q1800   sodium chloride, acetaminophen **OR** acetaminophen, acetaminophen, albuterol, lidocaine-prilocaine, ondansetron **OR** ondansetron (ZOFRAN) IV, sodium chloride flush, traMADol  Assessment/ Plan:  [name] is a 82 y.o. white female with atrial fibrillation on Coumadin, hypertension, gout, insulin-dependent diabetes mellitus type 2, diabetic neuropathy, CVA with right visual field blindness, hyperlipidemia, major depressive disorder, overactive bladder with incontinence, generalized anxiety disorder, congestive heart failure diastolic, parathyroidectomy, and osteoarthritis  MWF [facility] [address]   1. End Stage Renal Disease: Right arm AVF hemodialysis treatment yesterday as outpatient.  2. Peripheral vascular disease: with osteomyelitis:  - empiric vanco and zosyn - Appreciate podiatry, ID and vascular input.   3. Hypertension:  Blood pressure at goal - diltiazem and furosemide.   4. Secondary Hyperparathyroidism: with hyperphosphatemia.  - calcium acetate with meals.   5. Anemia of chronic kidney disease: hemoglobin 9.4 - epo with treatment.     LOS: 1 [name] 9/16/20171:01 PM

## 2016-02-15 NOTE — Consult Note (Addendum)
Reason for Consult: Right heel ulceration, abscess Referring Physician: Dr. Darrelyn Hillock is an 82 y.o. female.  HPI: Pleasant 53yof DM, chronic right heel wound history of debridements and wound vacs, presents with abscess of right heel. Subjective fevers, chills.Patient has neuropathy, denies pain. CT revealed abscess extending along the Achilles.  Past Medical History:  Diagnosis Date  . Anemia   . Arthritis    gout  . Atrial fibrillation ([place])   . CHF (congestive heart failure) ([place])   . Chronic kidney disease   . Diabetes mellitus without complication ([place])   . Dialysis patient ([place])   . Dysrhythmia   . GERD (gastroesophageal reflux disease)   . Hypertension   . Peripheral vascular disease ([place])   . Pleural effusion   . Pulmonary hypertension ([place])   . Renal insufficiency   . Restless leg syndrome   . Shortness of breath dyspnea    with exertion  . Stroke [facility])     Past Surgical History:  Procedure Laterality Date  . AMPUTATION Right 11/07/2015   Procedure: AMPUTATION DIGIT ( 4TH TOE, RIGHT FOOT );  Surgeon: Algernon Huxley, MD;  Location: [facility] ORS;  Service: Vascular;  Laterality: Right;  . AMPUTATION TOE Left 06/07/2015   Procedure: AMPUTATION TOE;  Surgeon: Samara Deist, DPM;  Location: [facility] ORS;  Service: Podiatry;  Laterality: Left;  . CARDIAC CATHETERIZATION    . CATARACT EXTRACTION, BILATERAL    . CHOLECYSTECTOMY    . ENDARTERECTOMY Left 07/26/2015   Procedure: ENDARTERECTOMY CAROTID;  Surgeon: Katha Cabal, MD;  Location: [facility] ORS;  Service: Vascular;  Laterality: Left;  . EYE SURGERY    . FISTULAGRAM ([place] HX)    . JOINT REPLACEMENT     bilateral hip  . MEDTRONIC BLADDER INTERSTEM     currently turned off for MRI in January  . PARATHYROIDECTOMY    . PERIPHERAL VASCULAR CATHETERIZATION N/A 10/08/2014   Procedure: A/V Shuntogram/Fistulagram;  Surgeon: Algernon Huxley, MD;  Location: [place] CV LAB;  Service: Cardiovascular;  Laterality: N/A;  .  PERIPHERAL VASCULAR CATHETERIZATION N/A 03/04/2015   Procedure: A/V Shuntogram/Fistulagram;  Surgeon: Algernon Huxley, MD;  Location: [place] CV LAB;  Service: Cardiovascular;  Laterality: N/A;  . PERIPHERAL VASCULAR CATHETERIZATION N/A 03/04/2015   Procedure: A/V Shunt Intervention;  Surgeon: Algernon Huxley, MD;  Location: [place] CV LAB;  Service: Cardiovascular;  Laterality: N/A;  . PERIPHERAL VASCULAR CATHETERIZATION N/A 09/16/2015   Procedure: Lower Extremity Angiography;  Surgeon: Algernon Huxley, MD;  Location: [place] CV LAB;  Service: Cardiovascular;  Laterality: N/A;  . PERIPHERAL VASCULAR CATHETERIZATION  09/16/2015   Procedure: Lower Extremity Intervention;  Surgeon: Algernon Huxley, MD;  Location: [place] CV LAB;  Service: Cardiovascular;;  . THYROIDECTOMY, PARTIAL    . TONSILLECTOMY    . WOUND DEBRIDEMENT Right 11/07/2015   Procedure: DEBRIDEMENT WOUND ( RIGHT HEEL AND ABDOMINAL WOUND (2) );  Surgeon: Algernon Huxley, MD;  Location: [facility] ORS;  Service: Vascular;  Laterality: Right;  . WOUND DEBRIDEMENT N/A 11/28/2015   Procedure: DEBRIDEMENT WOUND ( ABDOMINAL );  Surgeon: Algernon Huxley, MD;  Location: [facility] ORS;  Service: Vascular;  Laterality: N/A;    Family History  Problem Relation Age of Onset  . Colon cancer Mother   . Stroke Father   . Diabetes Brother     Social History:  reports that she has never smoked. She has never used smokeless tobacco. She reports that she does  not drink alcohol or use drugs.  Allergies:  Allergies  Allergen Reactions  . Codeine Other (See Comments)    Reaction:  Confusion/Hallucinations   . Contrast Media [Iodinated Diagnostic Agents] Other (See Comments)    "skin peel"  . Oxycodone Other (See Comments)    Reaction:  Confusion/Hallucinations   . Quinine Derivatives Rash    Medications: I have reviewed the patient's current medications.  Results for orders placed or performed during the hospital encounter of 02/14/16 (from the past 48  hour(s))  CBC     Status: Abnormal   Collection Time: 02/14/16  4:01 PM  Result Value Ref Range   WBC 9.3 3.6 - 11.0 K/uL   RBC 3.48 (L) 3.80 - 5.20 MIL/uL   Hemoglobin 10.2 (L) 12.0 - 16.0 g/dL   HCT 31.2 (L) 35.0 - 47.0 %   MCV 89.5 80.0 - 100.0 fL   MCH 29.3 26.0 - 34.0 pg   MCHC 32.8 32.0 - 36.0 g/dL   RDW 20.0 (H) 11.5 - 14.5 %   Platelets 263 150 - 440 K/uL  Comprehensive metabolic panel     Status: Abnormal   Collection Time: 02/14/16  4:01 PM  Result Value Ref Range   Sodium 136 135 - 145 mmol/L   Potassium 3.6 3.5 - 5.1 mmol/L   Chloride 98 (L) 101 - 111 mmol/L   CO2 24 22 - 32 mmol/L   Glucose, Bld 174 (H) 65 - 99 mg/dL   BUN 43 (H) 6 - 20 mg/dL   Creatinine, Ser 3.90 (H) 0.44 - 1.00 mg/dL   Calcium 8.2 (L) 8.9 - 10.3 mg/dL   Total Protein 8.2 (H) 6.5 - 8.1 g/dL   Albumin 3.3 (L) 3.5 - 5.0 g/dL   AST 39 15 - 41 U/L   ALT 21 14 - 54 U/L   Alkaline Phosphatase 82 38 - 126 U/L   Total Bilirubin 0.4 0.3 - 1.2 mg/dL   GFR calc non Af Amer 10 (L) >60 mL/min   GFR calc Af Amer 12 (L) >60 mL/min    Comment: (NOTE) The eGFR has been calculated using the CKD EPI equation. This calculation has not been validated in all clinical situations. eGFR's persistently <60 mL/min signify possible Chronic Kidney Disease.    Anion gap 14 5 - 15  Blood culture (routine x 2)     Status: None (Preliminary result)   Collection Time: 02/14/16  8:06 PM  Result Value Ref Range   Specimen Description BLOOD LEFT HAND    Special Requests BOTTLES DRAWN AEROBIC AND ANAEROBIC [place]    Culture NO GROWTH < 12 HOURS    Report Status PENDING   Blood culture (routine x 2)     Status: None (Preliminary result)   Collection Time: 02/14/16  8:06 PM  Result Value Ref Range   Specimen Description BLOOD LEFT WRIST    Special Requests BOTTLES DRAWN AEROBIC AND ANAEROBIC [place]    Culture NO GROWTH < 12 HOURS    Report Status PENDING   Lactic acid, plasma     Status: None   Collection Time: 02/14/16  8:06  PM  Result Value Ref Range   Lactic Acid, Venous 1.4 0.5 - 1.9 mmol/L  APTT     Status: Abnormal   Collection Time: 02/14/16 10:01 PM  Result Value Ref Range   aPTT 51 (H) 24 - 36 seconds    Comment:        IF BASELINE aPTT IS ELEVATED, SUGGEST PATIENT  RISK ASSESSMENT BE USED TO DETERMINE APPROPRIATE ANTICOAGULANT THERAPY.   Protime-INR     Status: Abnormal   Collection Time: 02/14/16 10:01 PM  Result Value Ref Range   Prothrombin Time 27.9 (H) 11.4 - 15.2 seconds   INR 2.55   Glucose, capillary     Status: None   Collection Time: 02/14/16 10:23 PM  Result Value Ref Range   Glucose-Capillary 89 65 - 99 mg/dL  CBC     Status: Abnormal   Collection Time: 02/15/16  4:20 AM  Result Value Ref Range   WBC 8.0 3.6 - 11.0 K/uL   RBC 3.11 (L) 3.80 - 5.20 MIL/uL   Hemoglobin 9.4 (L) 12.0 - 16.0 g/dL   HCT 28.2 (L) 35.0 - 47.0 %   MCV 90.6 80.0 - 100.0 fL   MCH 30.2 26.0 - 34.0 pg   MCHC 33.4 32.0 - 36.0 g/dL   RDW 19.5 (H) 11.5 - 14.5 %   Platelets 243 150 - 440 K/uL  Basic metabolic panel     Status: Abnormal   Collection Time: 02/15/16  4:20 AM  Result Value Ref Range   Sodium 138 135 - 145 mmol/L   Potassium 4.1 3.5 - 5.1 mmol/L   Chloride 102 101 - 111 mmol/L   CO2 24 22 - 32 mmol/L   Glucose, Bld 61 (L) 65 - 99 mg/dL   BUN 55 (H) 6 - 20 mg/dL   Creatinine, Ser 4.79 (H) 0.44 - 1.00 mg/dL   Calcium 7.7 (L) 8.9 - 10.3 mg/dL   GFR calc non Af Amer 8 (L) >60 mL/min   GFR calc Af Amer 9 (L) >60 mL/min    Comment: (NOTE) The eGFR has been calculated using the CKD EPI equation. This calculation has not been validated in all clinical situations. eGFR's persistently <60 mL/min signify possible Chronic Kidney Disease.    Anion gap 12 5 - 15  Protime-INR     Status: Abnormal   Collection Time: 02/15/16  4:20 AM  Result Value Ref Range   Prothrombin Time 26.0 (H) 11.4 - 15.2 seconds   INR 2.33     No results found.  Review of Systems  Constitutional: Positive for chills  and fever.  HENT: Negative.   Eyes: Negative.   Respiratory: Negative.   Cardiovascular: Negative.   Gastrointestinal: Negative.   Genitourinary: Negative.   Musculoskeletal: Negative.   Skin: Negative.   Neurological: Negative.   Endo/Heme/Allergies: Negative.   Psychiatric/Behavioral: Negative.    Blood pressure 130/60, pulse 88, temperature 98 F (36.7 C), temperature source Oral, resp. rate 19, height 5' 5"  (1.651 m), weight 70.2 kg (154 lb 12.8 oz), SpO2 95 %. Physical Exam  Constitutional: She is oriented to person, place, and time. She appears well-developed.  HENT:  Head: Normocephalic and atraumatic.  Eyes: Pupils are equal, round, and reactive to light.  Neck: Normal range of motion. Neck supple.  Cardiovascular: Normal rate, regular rhythm and normal heart sounds.   Respiratory: Effort normal and breath sounds normal. No respiratory distress. She has no wheezes.  GI: Soft. Bowel sounds are normal. She exhibits no distension. There is no tenderness. There is no rebound.  Musculoskeletal:  Right heel ulceration, purulent drainage, sloughing, erythema  Neurological: She is alert and oriented to person, place, and time.  Skin: No rash noted. There is erythema.    Assessment/Plan: Right heel abscess and osteomyelitis. IV antibiotics  Will plan for debridement and vac placement tomorrow.  Risks, benefits alternatives explained, consent  obtained. Extensive discussion regarding the future possibility of further debridements and possibility of amputation considering the infection involves the Achilles and calcaneous.  Hold Coumadin tonight  Esco, Miechia A 02/15/2016, 9:28 AM

## 2016-02-15 NOTE — Progress Notes (Signed)
Received report from [place], [place], care of pt assumed

## 2016-02-15 NOTE — Progress Notes (Signed)
Pharmacy Antibiotic Note  Helane RimaJoann Y Demetriou is a 82 y.o. female admitted on 02/14/2016.  Pharmacy has been consulted for Zosyn and vancomycin dosing.  Plan: 1. Zosyn 3.375 gm IV Q12H EI 2. Vancomycin 1 gm IV x 1 in ED. Will give additional 500 mg IV x 1 now, followed by vancomycin 750 mg IV q-dialysis MWF. Pharmacy will continue to follow and adjust as needed to maintain trough 15 to 20 mcg/mL.  Height: 5\' 5"  (165.1 cm) Weight: 160 lb (72.6 kg) IBW/kg (Calculated) : 57  Temp (24hrs), Avg:98 F (36.7 C), Min:98 F (36.7 C), Max:98.1 F (36.7 C)   Recent Labs Lab 02/14/16 1601 02/14/16 2006  WBC 9.3  --   CREATININE 3.90*  --   LATICACIDVEN  --  1.4    Estimated Creatinine Clearance: 11.5 mL/min (by C-G formula based on SCr of 3.9 mg/dL (H)).    Allergies  Allergen Reactions  . Codeine Other (See Comments)    Reaction:  Confusion/Hallucinations   . Contrast Media [Iodinated Diagnostic Agents] Other (See Comments)    "skin peel"  . Oxycodone Other (See Comments)    Reaction:  Confusion/Hallucinations   . Quinine Derivatives Rash    Thank you for allowing pharmacy to be a part of this patient's care.  Carola FrostNathan A Johnrobert Foti, Pharm.D., BCPS Clinical Pharmacist 02/15/2016 1:13 AM

## 2016-02-16 ENCOUNTER — Inpatient Hospital Stay: Payer: Medicare Other | Admitting: Anesthesiology

## 2016-02-16 ENCOUNTER — Encounter
Admission: EM | Disposition: A | Discharge status: Skilled Nursing Facility | Payer: Self-pay | Source: Home / Self Care | Attending: Internal Medicine

## 2016-02-16 HISTORY — PX: INCISION AND DRAINAGE ABSCESS: SHX5864

## 2016-02-16 LAB — CBC
HEMATOCRIT: 30.8 % — AB (ref 35.0–47.0)
Hemoglobin: 10.2 g/dL — ABNORMAL LOW (ref 12.0–16.0)
MCH: 30.1 pg (ref 26.0–34.0)
MCHC: 33.2 g/dL (ref 32.0–36.0)
MCV: 90.5 fL (ref 80.0–100.0)
Platelets: 259 10*3/uL (ref 150–440)
RBC: 3.4 MIL/uL — ABNORMAL LOW (ref 3.80–5.20)
RDW: 19.3 % — AB (ref 11.5–14.5)
WBC: 10 10*3/uL (ref 3.6–11.0)

## 2016-02-16 LAB — BASIC METABOLIC PANEL
Anion gap: 15 (ref 5–15)
BUN: 76 mg/dL — ABNORMAL HIGH (ref 6–20)
CALCIUM: 8 mg/dL — AB (ref 8.9–10.3)
CO2: 22 mmol/L (ref 22–32)
CREATININE: 5.89 mg/dL — AB (ref 0.44–1.00)
Chloride: 100 mmol/L — ABNORMAL LOW (ref 101–111)
GFR calc non Af Amer: 6 mL/min — ABNORMAL LOW (ref >60)
GFR, EST AFRICAN AMERICAN: 7 mL/min — AB (ref >60)
Glucose, Bld: 46 mg/dL — ABNORMAL LOW (ref 65–99)
Potassium: 4.4 mmol/L (ref 3.5–5.1)
SODIUM: 137 mmol/L (ref 135–145)

## 2016-02-16 LAB — GLUCOSE, CAPILLARY
GLUCOSE-CAPILLARY: 62 mg/dL — AB (ref 65–99)
GLUCOSE-CAPILLARY: 153 mg/dL — AB (ref 65–99)
GLUCOSE-CAPILLARY: 206 mg/dL — AB (ref 65–99)
Glucose-Capillary: 96 mg/dL (ref 65–99)
Glucose-Capillary: 117 mg/dL — ABNORMAL HIGH (ref 65–99)

## 2016-02-16 LAB — PROTIME-INR
INR: 1.72
PROTHROMBIN TIME: 20.4 s — AB (ref 11.4–15.2)

## 2016-02-16 SURGERY — INCISION AND DRAINAGE, ABSCESS
Anesthesia: General | Laterality: Right | Wound class: Dirty or Infected

## 2016-02-16 MED ORDER — PROMETHAZINE HCL 25 MG/ML IJ SOLN: 6.2500 mg | INTRAMUSCULAR | Status: DC | PRN

## 2016-02-16 MED ORDER — WARFARIN SODIUM 2 MG PO TABS
2.0000 mg | ORAL_TABLET | Freq: Every day | ORAL | Status: DC
  Administered 2016-02-16: 2 mg via ORAL
  Filled 2016-02-16: qty 1

## 2016-02-16 MED ORDER — EPHEDRINE SULFATE 50 MG/ML IJ SOLN
INTRAMUSCULAR | Status: DC | PRN
  Administered 2016-02-16: 10 mg via INTRAVENOUS

## 2016-02-16 MED ORDER — DEXTROSE-NACL 5-0.45 % IV SOLN
INTRAVENOUS | Status: DC | PRN
  Administered 2016-02-16: 08:00:00 via INTRAVENOUS

## 2016-02-16 MED ORDER — FENTANYL CITRATE (PF) 100 MCG/2ML IJ SOLN: 25.0000 ug | INTRAMUSCULAR | Status: DC | PRN

## 2016-02-16 MED ORDER — PROPOFOL 10 MG/ML IV BOLUS
INTRAVENOUS | Status: DC | PRN
Start: 2016-02-16 — End: 2016-02-16
  Administered 2016-02-16: 100 mg via INTRAVENOUS

## 2016-02-16 MED ORDER — NEOMYCIN-POLYMYXIN B GU 40-200000 IR SOLN
Status: AC
  Filled 2016-02-16: qty 20

## 2016-02-16 SURGICAL SUPPLY — 35 items
1000ML WOUND VAC CANISTER ×2 IMPLANT
BRUSH SCRUB 4% CHG (MISCELLANEOUS) ×3 IMPLANT
CANISTER SUCT 1200ML W/VALVE (MISCELLANEOUS) ×3 IMPLANT
CHLORAPREP W/TINT 26ML (MISCELLANEOUS) ×3 IMPLANT
DRAPE INCISE IOBAN 66X45 STRL (DRAPES) ×3 IMPLANT
DRSG VAC ATS MED SENSATRAC (GAUZE/BANDAGES/DRESSINGS) ×3 IMPLANT
ELECT CAUTERY BLADE 6.4 (BLADE) ×3 IMPLANT
ELECT REM PT RETURN 9FT ADLT (ELECTROSURGICAL) ×3
ELECTRODE REM PT RTRN 9FT ADLT (ELECTROSURGICAL) ×1 IMPLANT
GLOVE BIO SURGEON STRL SZ7 (GLOVE) ×3 IMPLANT
GOWN STRL REUS W/ TWL LRG LVL3 (GOWN DISPOSABLE) ×1 IMPLANT
GOWN STRL REUS W/ TWL XL LVL3 (GOWN DISPOSABLE) ×1 IMPLANT
GOWN STRL REUS W/TWL LRG LVL3 (GOWN DISPOSABLE) ×2
GOWN STRL REUS W/TWL XL LVL3 (GOWN DISPOSABLE) ×2
HANDPIECE INTERPULSE COAX TIP (DISPOSABLE) ×2
IV NS 1000ML (IV SOLUTION) ×2
IV NS 1000ML BAXH (IV SOLUTION) ×1 IMPLANT
KIT RM TURNOVER STRD PROC AR (KITS) ×3 IMPLANT
LABEL OR SOLS (LABEL) ×3 IMPLANT
NS IRRIG 500ML POUR BTL (IV SOLUTION) ×3 IMPLANT
PACK EXTREMITY ARMC (MISCELLANEOUS) ×3 IMPLANT
PAD PREP 24X41 OB/GYN DISP (PERSONAL CARE ITEMS) ×3 IMPLANT
SET HNDPC FAN SPRY TIP SCT (DISPOSABLE) ×1 IMPLANT
SOL PREP PVP 2OZ (MISCELLANEOUS) ×3
SOLUTION PREP PVP 2OZ (MISCELLANEOUS) ×1 IMPLANT
SPONGE LAP 18X18 5 PK (GAUZE/BANDAGES/DRESSINGS) ×3 IMPLANT
SUT ETHILON 4-0 (SUTURE) ×2
SUT ETHILON 4-0 FS2 18XMFL BLK (SUTURE) ×1
SUT VIC AB 3-0 SH 27 (SUTURE) ×4
SUT VIC AB 3-0 SH 27X BRD (SUTURE) ×2 IMPLANT
SUTURE ETHLN 4-0 FS2 18XMF BLK (SUTURE) ×1 IMPLANT
SWAB CULTURE AMIES ANAERIB BLU (MISCELLANEOUS) ×3 IMPLANT
SYR BULB EAR ULCER 3OZ GRN STR (SYRINGE) ×3 IMPLANT
V.A.V. SIMPLACE EX MEDIUM DRESSING ×3 IMPLANT
WND VAC CANISTER 500ML (MISCELLANEOUS) IMPLANT

## 2016-02-16 NOTE — NC FL2 (Signed)
[place] MEDICAID FL2 LEVEL OF CARE SCREENING TOOL     IDENTIFICATION  Patient Name: [name] Birthdate: [birth year] Sex: female Admission Date (Current Location): 02/14/2016  [place]   Facility and Address:  [facility], [address], [place], [place] [address]      Provider Number: [id number]  Attending Physician Name and Address:  Adrian SaranSital Mody, MD  Relative Name and Phone Number:       Current Level of Care: Hospital Recommended Level of Care: Skilled Nursing Facility Prior Approval Number:    Date Approved/Denied: 11/27/04 PASRR Number: [id number]  Discharge Plan: SNF    Current Diagnoses: Patient Active Problem List   Diagnosis Date Noted  . Osteomyelitis (HCC) 02/14/2016  . Hematoma 10/12/2015  . Leg pain, right 09/14/2015  . PVD (peripheral vascular disease) with claudication (HCC) 09/14/2015  . Pneumonia 08/13/2015  . Carotid stenosis 07/26/2015  . Amputation of fifth toe, left, traumatic (HCC) 06/24/2015  . Diabetes mellitus (HCC) 06/23/2015  . Diabetic neuropathy (HCC) 06/23/2015  . Chronic atrial fibrillation (HCC) 06/23/2015  . Left carotid artery stenosis 06/23/2015  . CVA (cerebral infarction) 06/21/2015  . Pressure ulcer 06/19/2015  . End stage renal disease (HCC) 06/18/2015  . Respiratory distress 12/11/2014    Orientation RESPIRATION BLADDER Height & Weight     Self, Time, Situation  Normal Continent Weight: 154 lb 12.8 oz (70.2 kg) Height:  5\' 5"  (165.1 cm)  BEHAVIORAL SYMPTOMS/MOOD NEUROLOGICAL BOWEL NUTRITION STATUS      Continent    AMBULATORY STATUS COMMUNICATION OF NEEDS Skin   Extensive Assist Verbally Surgical wounds, Wound Vac                       Personal Care Assistance Level of Assistance  Bathing, Dressing, Total care Bathing Assistance: Maximum assistance   Dressing Assistance: Maximum assistance Total Care Assistance: Maximum assistance   Functional  Limitations Info             SPECIAL CARE FACTORS FREQUENCY  PT (By licensed PT)     PT Frequency: 5x week 5x day              Contractures      Additional Factors Info  Code Status, Allergies Code Status Info: DNR Allergies Info: Codeine, Contrast Media Iodinated Diagnostic Agents, Oxycodone, Quinine Derivatives           Current Medications (02/16/2016):  This is the current hospital active medication list Current Facility-Administered Medications  Medication Dose Route Frequency Provider Last Rate Last Dose  . 0.9 %  sodium chloride infusion  250 mL Intravenous PRN Auburn BilberryShreyang Patel, MD      . acetaminophen (TYLENOL) tablet 650 mg  650 mg Oral Q6H PRN Auburn BilberryShreyang Patel, MD       Or  . acetaminophen (TYLENOL) suppository 650 mg  650 mg Rectal Q6H PRN Auburn BilberryShreyang Patel, MD      . acetaminophen (TYLENOL) tablet 650 mg  650 mg Oral Q6H PRN Auburn BilberryShreyang Patel, MD      . albuterol (PROVENTIL) (2.5 MG/3ML) 0.083% nebulizer solution 3 mL  3 mL Inhalation Q6H PRN Auburn BilberryShreyang Patel, MD      . calcium acetate (PHOSLO) capsule 1,334 mg  1,334 mg Oral TID WC Auburn BilberryShreyang Patel, MD   1,334 mg at 02/15/16 1737  . diltiazem (CARDIZEM CD) 24 hr capsule 120 mg  120 mg Oral Willene HatchetBH-q7a Shreyang Patel, MD   120 mg at 02/16/16 0548  .  famotidine (PEPCID) tablet 10 mg  10 mg Oral Daily Auburn Bilberry, MD   10 mg at 02/15/16 1119  . furosemide (LASIX) tablet 80 mg  80 mg Oral BID Auburn Bilberry, MD   80 mg at 02/15/16 1737  . gabapentin (NEURONTIN) capsule 100 mg  100 mg Oral Once per day on Sun Tue Thu Sat Auburn Bilberry, MD   100 mg at 02/15/16 2138  . [START ON 02/17/2016] gabapentin (NEURONTIN) capsule 300 mg  300 mg Oral Once per day on Mon Wed Fri Auburn Bilberry, MD      . insulin aspart (novoLOG) injection 0-5 Units  0-5 Units Subcutaneous QHS Sital Mody, MD      . insulin aspart (novoLOG) injection 0-9 Units  0-9 Units Subcutaneous TID WC Adrian Saran, MD   2 Units at 02/15/16 1254  . insulin detemir (LEVEMIR)  injection 11 Units  11 Units Subcutaneous BID Auburn Bilberry, MD   11 Units at 02/15/16 2138  . lidocaine-prilocaine (EMLA) cream 1 application  1 application Topical PRN Auburn Bilberry, MD      . multivitamin (RENA-VIT) tablet 1 tablet  1 tablet Oral Daily Auburn Bilberry, MD   1 tablet at 02/15/16 1121  . omega-3 acid ethyl esters (LOVAZA) capsule 2 g  2 g Oral Daily Auburn Bilberry, MD   2 g at 02/15/16 1120  . ondansetron (ZOFRAN) tablet 4 mg  4 mg Oral Q6H PRN Auburn Bilberry, MD       Or  . ondansetron (ZOFRAN) injection 4 mg  4 mg Intravenous Q6H PRN Auburn Bilberry, MD      . PARoxetine (PAXIL) tablet 40 mg  40 mg Oral QHS Auburn Bilberry, MD   40 mg at 02/15/16 2138  . piperacillin-tazobactam (ZOSYN) IVPB 3.375 g  3.375 g Intravenous Q12H Auburn Bilberry, MD   3.375 g at 02/16/16 0845  . pravastatin (PRAVACHOL) tablet 20 mg  20 mg Oral Daily Auburn Bilberry, MD   20 mg at 02/15/16 1137  . sodium chloride flush (NS) 0.9 % injection 3 mL  3 mL Intravenous Q12H Auburn Bilberry, MD   3 mL at 02/15/16 1146  . sodium chloride flush (NS) 0.9 % injection 3 mL  3 mL Intravenous PRN Auburn Bilberry, MD      . traMADol Janean Sark) tablet 50 mg  50 mg Oral Q6H PRN Auburn Bilberry, MD      . Melene Muller ON 02/17/2016] vancomycin (VANCOCIN) IVPB 750 mg/150 ml premix  750 mg Intravenous Q M,W,F-HD Auburn Bilberry, MD      . warfarin (COUMADIN) tablet 2 mg  2 mg Oral q1800 Bertram Denver, MD      . Warfarin - Physician Dosing Inpatient   Does not apply [id number] Auburn Bilberry, MD         Discharge Medications: Please see discharge summary for a list of discharge medications.  Relevant Imaging Results:  Relevant Lab Results:   Additional Information  SS [id number]  Judi Cong, LCSW

## 2016-02-16 NOTE — Transfer of Care (Signed)
Immediate Anesthesia Transfer of Care Note  Patient: Natalie BergamoJoann Y Fugitt  Procedure(s) Performed: Procedure(s): INCISION AND DRAINAGE ABSCESS (Left)  Patient Location: PACU  Anesthesia Type:General  Level of Consciousness: awake  Airway & Oxygen Therapy: Patient Spontanous Breathing and Patient connected to face mask oxygen  Post-op Assessment: Report given to RN  Post vital signs: Reviewed and stable  Last Vitals:  Vitals:   02/16/16 0715 02/16/16 0900  BP: (!) 126/54   Pulse: 78   Resp: 18   Temp: 36.4 C (P) 36.3 C    Last Pain:  Vitals:   02/16/16 0715  TempSrc: Oral  PainSc:          Complications: No apparent anesthesia complications

## 2016-02-16 NOTE — Anesthesia Procedure Notes (Signed)
Procedure Name: LMA Insertion Date/Time: 02/16/2016 8:20 AM Performed by: [id number], Tonika Eden Pre-anesthesia Checklist: Timeout performed, Patient being monitored, Suction available, Emergency Drugs available and Patient identified Patient Re-evaluated:Patient Re-evaluated prior to inductionOxygen Delivery Method: Circle system utilized Preoxygenation: Pre-oxygenation with 100% oxygen Intubation Type: IV induction LMA Size: 4.0 Tube type: Oral Number of attempts: 1 Placement Confirmation: CO2 detector,  breath sounds checked- equal and bilateral and positive ETCO2 Tube secured with: Tape

## 2016-02-16 NOTE — Progress Notes (Signed)
[facility] [place] at [facility]   PATIENT NAME: [name]    MR#:  [id number]  DATE OF BIRTH:  [birth year]  SUBJECTIVE:   Patient going to OR today for debridement  REVIEW OF SYSTEMS:    Review of Systems  Constitutional: Negative.  Negative for chills, fever and malaise/fatigue.  HENT: Negative.  Negative for ear discharge, ear pain, hearing loss, nosebleeds and sore throat.   Eyes: Negative.  Negative for blurred vision and pain.  Respiratory: Negative.  Negative for cough, hemoptysis, shortness of breath and wheezing.   Cardiovascular: Negative.  Negative for chest pain, palpitations and leg swelling.  Gastrointestinal: Negative.  Negative for abdominal pain, blood in stool, diarrhea, nausea and vomiting.  Genitourinary: Negative.  Negative for dysuria.  Musculoskeletal: Negative.  Negative for back pain.  Skin: Negative.        Wound right lower extremity/Achilles tendon\ Multiple abdominal ulcerations Left lower calcaneus wound  Neurological: Negative for dizziness, tremors, speech change, focal weakness, seizures and headaches.  Endo/Heme/Allergies: Negative.  Does not bruise/bleed easily.  Psychiatric/Behavioral: Negative.  Negative for depression, hallucinations and suicidal ideas.    Tolerating Diet: NPO      DRUG ALLERGIES:   Allergies  Allergen Reactions  . Codeine Other (See Comments)    Reaction:  Confusion/Hallucinations   . Contrast Media [Iodinated Diagnostic Agents] Other (See Comments)    "skin peel"  . Oxycodone Other (See Comments)    Reaction:  Confusion/Hallucinations   . Quinine Derivatives Rash    VITALS:  Blood pressure (!) 105/51, pulse 62, temperature 97.7 F (36.5 C), temperature source Oral, resp. rate 14, height 5\' 5"  (1.651 m), weight 70.2 kg (154 lb 12.8 oz), SpO2 98 %.  PHYSICAL EXAMINATION:   Physical Exam  Constitutional: She is oriented to person, place, and time and well-developed, well-nourished, and  in no distress. No distress.  HENT:  Head: Normocephalic.  Eyes: No scleral icterus.  Neck: Normal range of motion. Neck supple. No JVD present. No tracheal deviation present.  Cardiovascular: Normal heart sounds.  Exam reveals no gallop and no friction rub.   No murmur heard. Irr,irr  Pulmonary/Chest: Effort normal and breath sounds normal. No respiratory distress. She has no wheezes. She has no rales. She exhibits no tenderness.  Abdominal: Soft. Bowel sounds are normal. She exhibits no distension and no mass. There is no tenderness. There is no rebound and no guarding.  Musculoskeletal: Normal range of motion. She exhibits no edema.  Neurological: She is alert and oriented to person, place, and time.  Skin: Skin is warm. No rash noted. No erythema.  Patient with multiple ulcerations in her abdomen which appear well healing, left lower extremity and RLE covered but foul smelling  Psychiatric: Affect and judgment normal.      LABORATORY PANEL:   CBC  Recent Labs Lab 02/16/16 0500  WBC 10.0  HGB 10.2*  HCT 30.8*  PLT 259   ------------------------------------------------------------------------------------------------------------------  Chemistries   Recent Labs Lab 02/14/16 1601  02/16/16 0500  NA 136  < > 137  K 3.6  < > 4.4  CL 98*  < > 100*  CO2 24  < > 22  GLUCOSE 174*  < > 46*  BUN 43*  < > 76*  CREATININE 3.90*  < > 5.89*  CALCIUM 8.2*  < > 8.0*  AST 39  --   --   ALT 21  --   --   ALKPHOS 82  --   --  BILITOT 0.4  --   --   < > = values in this interval not displayed. ------------------------------------------------------------------------------------------------------------------  Cardiac Enzymes No results for input(s): TROPONINI in the last 168 hours. ------------------------------------------------------------------------------------------------------------------  RADIOLOGY:  No results found.   ASSESSMENT AND PLAN:   82 year old female  with a history of severe peripheral vascular disease, diabetes, ESRD on hemodialysis, atrial fibrillation who presents for evaluation of an ulceration of her right lower extremity and found to have osteomyelitis of calcaneus with a small abscess.  1. 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 Going for debridement this am and will likely need wound VAC D/w patient and daughter  2. Abdominal wounds: These appear well healed. Wound care consult.  3. ESRD and hematemesis: Patient plan for dialysis tomorrow. 4. Peripheral vascular disease: Appreciate vascular surgery consult. Continue pravastatin.  5. Atrial fibrillation/atrial flutter chronic: Coumadin as per pharmacy dosing. Continue diltiazem for rate control.  6. Diabetes: Last A1c 6.6   Continue ADA diet, sliding scale insulin and Levemir. Diabetes coordinator consult place.  7. Depression: Continue Paxil   Management plans discussed with the patient and she is in agreement. Rounded with nursing  CODE STATUS: full  TOTAL TIME TAKING CARE OF THIS PATIENT: 25 minutes.     POSSIBLE D/C 2-3 days, DEPENDING ON CLINICAL CONDITION.   [name] M.D on 02/16/2016 at 11:01 AM  Between 7am to 6pm - Pager - [phone] After 6pm go to [email or web address] - password [email or web address]  [facility]  Office  [phone]  CC: Primary care physician; Rolm GalaGRANDIS, HEIDI, MD  Note: This dictation was prepared with Dragon dictation along with smaller phrase technology. Any transcriptional errors that result from this process are unintentional.

## 2016-02-16 NOTE — Progress Notes (Signed)
[facility]  ROUNDING NOTE   Subjective:   Right heel I&D this morning by Dr. Evie LacksEsco.   Objective:  Vital signs in last 24 hours:  Temp:  [97.4 F (36.3 C)-98.6 F (37 C)] 97.7 F (36.5 C) (09/17 1033) Pulse Rate:  [62-98] 62 (09/17 1033) Resp:  [14-18] 14 (09/17 1033) BP: (97-126)/(42-55) 105/51 (09/17 1033) SpO2:  [95 %-100 %] 98 % (09/17 1033)  Weight change:  Filed Weights   02/14/16 1549 02/14/16 2219 02/15/16 0431  Weight: 72.6 kg (160 lb) 70.2 kg (154 lb 12.8 oz) 70.2 kg (154 lb 12.8 oz)    Intake/Output: I/O last 3 completed shifts: In: 580 [P.O.:480; IV Piggyback:100] Out: 100 [Urine:100]   Intake/Output this shift:  Total I/O In: 350 [I.V.:350] Out: -   Physical Exam: General: NAD,   Head: Normocephalic, atraumatic. Moist oral mucosal membranes  Eyes: Anicteric, PERRL  Neck: Supple, trachea midline  Lungs:  Clear to auscultation  Heart: Regular rate and rhythm  Abdomen:  Soft, nontender,   Extremities: Right heel wound vac  Neurologic: Nonfocal, moving all four extremities  Skin: No lesions  Access: Right AVF    Basic Metabolic Panel:  Recent Labs Lab 02/14/16 1601 02/15/16 0420 02/16/16 0500  NA 136 138 137  K 3.6 4.1 4.4  CL 98* 102 100*  CO2 24 24 22   GLUCOSE 174* 61* 46*  BUN 43* 55* 76*  CREATININE 3.90* 4.79* 5.89*  CALCIUM 8.2* 7.7* 8.0*    Liver Function Tests:  Recent Labs Lab 02/14/16 1601  AST 39  ALT 21  ALKPHOS 82  BILITOT 0.4  PROT 8.2*  ALBUMIN 3.3*   No results for input(s): LIPASE, AMYLASE in the last 168 hours. No results for input(s): AMMONIA in the last 168 hours.  CBC:  Recent Labs Lab 02/14/16 1601 02/15/16 0420 02/16/16 0500  WBC 9.3 8.0 10.0  HGB 10.2* 9.4* 10.2*  HCT 31.2* 28.2* 30.8*  MCV 89.5 90.6 90.5  PLT 263 243 259    Cardiac Enzymes: No results for input(s): CKTOTAL, CKMB, CKMBINDEX, TROPONINI in the last 168 hours.  BNP: Invalid input(s): POCBNP  CBG:  Recent  Labs Lab 02/14/16 2223 02/15/16 1129 02/15/16 1648 02/15/16 2125 02/16/16 0909  GLUCAP 89 153* 115* 116* 96    Microbiology: Results for orders placed or performed during the hospital encounter of 02/14/16  Blood culture (routine x 2)     Status: None (Preliminary result)   Collection Time: 02/14/16  8:06 PM  Result Value Ref Range Status   Specimen Description BLOOD LEFT HAND  Final   Special Requests BOTTLES DRAWN AEROBIC AND ANAEROBIC 7CC  Final   Culture NO GROWTH 2 DAYS  Final   Report Status PENDING  Incomplete  Blood culture (routine x 2)     Status: None (Preliminary result)   Collection Time: 02/14/16  8:06 PM  Result Value Ref Range Status   Specimen Description BLOOD LEFT WRIST  Final   Special Requests BOTTLES DRAWN AEROBIC AND ANAEROBIC 7CC  Final   Culture NO GROWTH 2 DAYS  Final   Report Status PENDING  Incomplete    Coagulation Studies:  Recent Labs  02/14/16 2201 02/15/16 0420 02/16/16 0500  LABPROT 27.9* 26.0* 20.4*  INR 2.55 2.33 1.72    Urinalysis: No results for input(s): COLORURINE, LABSPEC, PHURINE, GLUCOSEU, HGBUR, BILIRUBINUR, KETONESUR, PROTEINUR, UROBILINOGEN, NITRITE, LEUKOCYTESUR in the last 72 hours.  Invalid input(s): APPERANCEUR    Imaging: No results found.   Medications:     .  calcium acetate  1,334 mg Oral TID WC  . diltiazem  120 mg Oral BH-q7a  . famotidine  10 mg Oral Daily  . furosemide  80 mg Oral BID  . gabapentin  100 mg Oral Once per day on Sun Tue Thu Sat  . [START ON 02/17/2016] gabapentin  300 mg Oral Once per day on Mon Wed Fri  . insulin aspart  0-5 Units Subcutaneous QHS  . insulin aspart  0-9 Units Subcutaneous TID WC  . insulin detemir  11 Units Subcutaneous BID  . multivitamin  1 tablet Oral Daily  . omega-3 acid ethyl esters  2 g Oral Daily  . PARoxetine  40 mg Oral QHS  . piperacillin-tazobactam (ZOSYN)  IV  3.375 g Intravenous Q12H  . pravastatin  20 mg Oral Daily  . sodium chloride flush  3 mL  Intravenous Q12H  . [START ON 02/17/2016] vancomycin  750 mg Intravenous Q M,W,F-HD  . warfarin  2 mg Oral q1800  . Warfarin - Physician Dosing Inpatient   Does not apply q1800   sodium chloride, acetaminophen **OR** acetaminophen, acetaminophen, albuterol, lidocaine-prilocaine, ondansetron **OR** ondansetron (ZOFRAN) IV, sodium chloride flush, traMADol  Assessment/ Plan:  [name] is a 82 y.o. white female with atrial fibrillation on Coumadin, hypertension, gout, insulin-dependent diabetes mellitus type 2, diabetic neuropathy, CVA with right visual field blindness, hyperlipidemia, major depressive disorder, overactive bladder with incontinence, generalized anxiety disorder, congestive heart failure diastolic, parathyroidectomy, and osteoarthritis  MWF [facility] [address]   1. End Stage Renal Disease: Right arm AVF - hemodialysis for tomorrow.   2. Peripheral vascular disease: with osteomyelitis: satus post I&D on 9/17 by Dr. Evie Lacks - empiric vanco and zosyn - Appreciate podiatry, ID and vascular input.   3. Hypertension:  Blood pressure at goal - diltiazem and furosemide.   4. Secondary Hyperparathyroidism: with hyperphosphatemia.  - calcium acetate with meals.   5. Anemia of chronic kidney disease: hemoglobin 10.2 - epo with treatment.     LOS: 2 [name] 9/17/201711:06 AM

## 2016-02-16 NOTE — Anesthesia Postprocedure Evaluation (Signed)
Anesthesia Post Note  Patient: Natalie BergamoJoann Y [name]  Procedure(s) Performed: Procedure(s) (LRB): INCISION AND DRAINAGE ABSCESS and application wound vac (Left)  Patient location during evaluation: PACU Anesthesia Type: General Level of consciousness: awake and alert Pain management: pain level controlled Vital Signs Assessment: post-procedure vital signs reviewed and stable Respiratory status: spontaneous breathing, nonlabored ventilation and respiratory function stable Cardiovascular status: blood pressure returned to baseline and stable Postop Assessment: no signs of nausea or vomiting Anesthetic complications: no    Last Vitals:  Vitals:   02/16/16 0901 02/16/16 1000  BP: (!) 121/55 (!) 110/53  Pulse: 79 68  Resp: 17 14  Temp: 36.4 C     Last Pain:  Vitals:   02/16/16 0934  TempSrc:   PainSc: 0-No pain                 Fatma Rutten

## 2016-02-16 NOTE — Op Note (Signed)
02/14/2016 - 02/16/2016  9:08 AM  PATIENT:  [name]  82 y.o. female  PRE-OPERATIVE DIAGNOSIS:  right heel wound osteomyelitis and abscess  POST-OPERATIVE DIAGNOSIS:  same  PROCEDURE:  Incision and drainage of right heel wound and wound vac placement  SURGEON:  Surgeon(s) and Role:    * Bertram DenverMiechia A Georgian Mcclory, MD - Primary   ANESTHESIA:   general  EBL:  Total I/O In: 300 [I.V.:300] Out: -   BLOOD ADMINISTERED:none  DRAINS: wound vac   LOCAL MEDICATIONS USED:  NONE  SPECIMEN:  Source of Specimen:  right heel  DISPOSITION OF SPECIMEN:  Source of Specimen:  right heel cultures  COUNTS:  YES  TOURNIQUET:  * No tourniquets in log *  DICTATION: .Note written in EPIC  PLAN OF CARE: Admit to inpatient   PATIENT DISPOSITION:  PACU - hemodynamically stable.   Delay start of Pharmacological VTE agent (>24hrs) due to surgical blood loss or risk of bleeding: no

## 2016-02-16 NOTE — Clinical Social Work Note (Signed)
Clinical Social Work Assessment  Patient Details  Name: [name] MRN: [id number] Date of Birth: November 09, 1933  Date of referral:  02/16/16               Reason for consult:  Facility Placement                Permission sought to share information with:  Family Supports, Magazine features editoracility Contact Representative Permission granted to share information::  Yes, Verbal Permission Granted  Name::     [name]  Agency::     Relationship::  Daughter  Contact Information:  [phone]  Housing/Transportation Living arrangements for the past 2 months:  Single Family Home Source of Information:  Adult Children Patient Interpreter Needed:  None Criminal Activity/Legal Involvement Pertinent to Current Situation/Hospitalization:  No - Comment as needed Significant Relationships:  Adult Children Lives with:  Self Do you feel safe going back to the place where you live?  Yes Need for family participation in patient care:  No (Coment)  Care giving concerns:  Possible SNF placement for STR with wound vac   Social Worker assessment / plan:  Patient's daughter contacted CSW to discuss potential dc options. Patient was sleeping at the time of the interview.   Patient is baseline independent with ADLs. Patient's daughter reported concern about wound care in the home, and she specified a [facility] ([facility] and [facility] (2nd Choice) for possible STR placement due to said wound. Patient's daughter gave verbal permission to begin the referral process ahead of PT recommendations.    Employment status:  Retired Health and safety inspectornsurance information:  Medicare PT Recommendations:  Not assessed at this time Information / Referral to community resources:  Skilled Nursing Facility  Patient/Family's Response to care:  Patient's daughter thanked the CSW for attention.  Patient/Family's Understanding of and Emotional Response to Diagnosis, Current Treatment, and Prognosis:  The patient's daughter has prior  experience with past STR for her mother and was able to verbalize appropriately her expectation for care and preferences.  Emotional Assessment Appearance:  Appears stated age Attitude/Demeanor/Rapport:   (Patient was sedated post-op) Affect (typically observed):   (Patient was sedated post-op) Orientation:   (Patient was sedated post-op) Alcohol / Substance use:  Never Used Psych involvement (Current and /or in the community):  No (Comment)  Discharge Needs  Concerns to be addressed:  Discharge Planning Concerns Readmission within the last 30 days:  No Current discharge risk:  Lives alone Barriers to Discharge:  Continued Medical Work up   [employer] CorporationKaren M Makayla Lanter, LCSW 02/16/2016, 11:39 AM

## 2016-02-16 NOTE — Progress Notes (Signed)
Inpatient Diabetes Program Recommendations  AACE/ADA: New Consensus Statement on Inpatient Glycemic Control (2015)  Target Ranges:  Prepandial:   less than 140 mg/dL      Peak postprandial:   less than 180 mg/dL (1-2 hours)      Critically ill patients:  140 - 180 mg/dL   Lab Results  Component Value Date   GLUCAP 117 (H) 02/16/2016   HGBA1C 6.6 (H) 06/19/2015   Consult for DM and Insulin Teaching received on 02/15/2016. Hypoglycemia in am x 2 days  Review of Glycemic Control  Diabetes history: DM2 Outpatient Diabetes medications: Levemir 11 units bid Current orders for Inpatient glycemic control: Levemir 11 units bid, Novolog sensitive tidwc and hs  Inpatient Diabetes Program Recommendations:    Needs updated HgbA1C. Last one 06/19/2015 - 6.6% Decrease Levemir to 6 units bid. D/C HS correction  Pt does not need insulin teaching. Has been on insulin several years. Needs Levemir dose to be adjusted for home. Hx hypoglycemia at each hospital visit for the past year.  Thank you. Ailene Ardshonda Anisia Leija, RD, LDN, CDE Inpatient Diabetes Coordinator [phone]

## 2016-02-16 NOTE — Anesthesia Preprocedure Evaluation (Addendum)
Anesthesia Evaluation  Patient identified by MRN, date of birth, ID band Patient awake    Reviewed: Allergy & Precautions, NPO status , Patient's Chart, lab work & pertinent test results  Airway Mallampati: II  TM Distance: >3 FB Neck ROM: Full    Dental  (+) Edentulous Upper, Edentulous Lower   Pulmonary shortness of breath and with exertion, neg sleep apnea, neg COPD,    breath sounds clear to auscultation- rhonchi (-) wheezing      Cardiovascular Exercise Tolerance: Poor hypertension, Pt. on medications + Peripheral Vascular Disease and +CHF  (-) Past MI and (-) Cardiac Stents + dysrhythmias Atrial Fibrillation  Rhythm:Irregular Rate:Normal + Systolic murmurs (holosystolic murmur grade 4)- Diastolic murmurs Echo 01/11/15: NORMAL LEFT VENTRICULAR SYSTOLIC FUNCTION NORMAL RIGHT VENTRICULAR SYSTOLIC FUNCTION SEVERE VALVULAR REGURGITATION (See above) MILD VALVULAR STENOSIS (See above) Mild MS with LAE Severe TR with severely elevated pulm pressures and dilated IVC   Neuro/Psych CVA, Residual Symptoms negative psych ROS   GI/Hepatic Neg liver ROS, GERD  ,  Endo/Other  diabetes, Insulin Dependent  Renal/GU ESRF and DialysisRenal disease     Musculoskeletal  (+) Arthritis ,   Abdominal (+) - obese,   Peds  Hematology  (+) anemia ,   Anesthesia Other Findings Past Medical History: No date: Anemia No date: Arthritis     Comment: gout No date: Atrial fibrillation (HCC) No date: CHF (congestive heart failure) (HCC) No date: Chronic kidney disease No date: Diabetes mellitus without complication (HCC) No date: Dialysis patient (HCC) No date: Dysrhythmia No date: GERD (gastroesophageal reflux disease) No date: Hypertension No date: Peripheral vascular disease (HCC) No date: Pleural effusion No date: Pulmonary hypertension (HCC) No date: Renal insufficiency No date: Restless leg syndrome No date: Shortness of breath  dyspnea     Comment: with exertion No date: Stroke [facility](HCC)   Reproductive/Obstetrics                            Anesthesia Physical Anesthesia Plan  ASA: IV  Anesthesia Plan: General   Post-op Pain Management:    Induction: Intravenous  Airway Management Planned: LMA  Additional Equipment:   Intra-op Plan:   Post-operative Plan:   Informed Consent: I have reviewed the patients History and Physical, chart, labs and discussed the procedure including the risks, benefits and alternatives for the proposed anesthesia with the patient or authorized representative who has indicated his/her understanding and acceptance.   Dental advisory given  Plan Discussed with: CRNA and Anesthesiologist  Anesthesia Plan Comments: (Patient has DNR order, discussed with patient that for purposes of surgery and perioperative period we would temporarily suspend her DNR order, she is in agreement)      Anesthesia Quick Evaluation

## 2016-02-17 ENCOUNTER — Encounter: Payer: Self-pay | Admitting: Vascular Surgery

## 2016-02-17 LAB — PROTIME-INR
INR: 1.74
PROTHROMBIN TIME: 20.6 s — AB (ref 11.4–15.2)

## 2016-02-17 LAB — GLUCOSE, CAPILLARY
GLUCOSE-CAPILLARY: 40 mg/dL — AB (ref 65–99)
GLUCOSE-CAPILLARY: 166 mg/dL — AB (ref 65–99)
GLUCOSE-CAPILLARY: 136 mg/dL — AB (ref 65–99)
GLUCOSE-CAPILLARY: 118 mg/dL — AB (ref 65–99)

## 2016-02-17 MED ORDER — DEXTROSE 5 % IV SOLN: 2.0000 g | INTRAVENOUS | Status: DC | PRN

## 2016-02-17 MED ORDER — SENNA 8.6 MG PO TABS
1.0000 | ORAL_TABLET | Freq: Every day | ORAL | Status: DC
  Administered 2016-02-17 – 2016-02-18 (×2): 8.6 mg via ORAL
  Filled 2016-02-17 (×2): qty 1

## 2016-02-17 MED ORDER — INSULIN DETEMIR 100 UNIT/ML ~~LOC~~ SOLN
6.0000 [IU] | Freq: Two times a day (BID) | SUBCUTANEOUS | Status: DC
  Administered 2016-02-17 – 2016-02-18 (×3): 6 [IU] via SUBCUTANEOUS
  Filled 2016-02-17 (×4): qty 0.06

## 2016-02-17 MED ORDER — LACTULOSE 10 GM/15ML PO SOLN
30.0000 g | Freq: Two times a day (BID) | ORAL | Status: DC
  Administered 2016-02-17 – 2016-02-18 (×2): 30 g via ORAL
  Filled 2016-02-17 (×3): qty 60

## 2016-02-17 MED ORDER — DOCUSATE SODIUM 100 MG PO CAPS
100.0000 mg | ORAL_CAPSULE | Freq: Two times a day (BID) | ORAL | Status: DC
  Administered 2016-02-17 – 2016-02-18 (×3): 100 mg via ORAL
  Filled 2016-02-17 (×3): qty 1

## 2016-02-17 MED ORDER — WARFARIN SODIUM 1 MG PO TABS
3.5000 mg | ORAL_TABLET | Freq: Once | ORAL | Status: AC
  Administered 2016-02-17: 3.5 mg via ORAL
  Filled 2016-02-17: qty 1

## 2016-02-17 NOTE — Progress Notes (Signed)
Pt is oob to chair with physical therapy. Per Pt, pt is unable to fully weight bear on her l leg and has poor balance. Pt is not to weight bear on her R foot until special shoe is able to to be procured. Pt will need ot return ot bed with slide/pivot transfer on flat surface.

## 2016-02-17 NOTE — Consult Note (Signed)
WOC Nurse wound consult note Reason for Consult:Chronic nonhealing wounds to abdominal pannus, left and right flank.  Unstageable pressure injury to left heel Skin tear to left elbow Surgical debridement to right heel, VAC applied yesterday in OR. Wound type: Chronic nonhealing wounds, unclear etiology Pressure Ulcer POA: N/A Measurement:LEft abdominal flank  3 cm x 4.5 cm x 0.3 cm with undermining present.  Right abdominal flank: 4 cm x 2 cm x 0.3 cm  LEft elbow  6 cm x 0.2 cm x 0.1 cm skin tear LEft heel 1 cm x 0.3 cm wound bed is 100% pale slough Wound XBJ:YNWGbed:pale pink nongranulating Drainage (amount, consistency, odor) Minimal serosanguinous.  No odor.  Periwound:Intact Dressing procedure/placement/frequency:Cleanse wounds to left elbow, left and right abdominal pannus and left heel with NS and pat gently dry. Gently fill wound bed (and any undermining) with Aquacel Ag, silver hydrofiber).  Cover with 4x4 gauze and tape.  Change Mon/Wed/Fri.   Podiatry to perform first post op VAC dressing change to right heel.  [facility].    WOC team will follow if needed [facility].  Maple HudsonKaren Jonny Dearden RN BSN CWON Pager [phone]

## 2016-02-17 NOTE — Progress Notes (Signed)
[facility]  ROUNDING NOTE   Subjective:   Feels well Right heel I&D done by Dr. Evie Lacks 02/16/16 Scheduled for HD later today No SOB  Objective:  Vital signs in last 24 hours:  Temp:  [97.5 F (36.4 C)-99.1 F (37.3 C)] 97.5 F (36.4 C) (09/18 0723) Pulse Rate:  [73-110] 73 (09/18 0723) Resp:  [14-18] 17 (09/18 0723) BP: (92-130)/(47-83) 128/53 (09/18 0723) SpO2:  [93 %-100 %] 100 % (09/18 0723)  Weight change:  Filed Weights   02/14/16 1549 02/14/16 2219 02/15/16 0431  Weight: 72.6 kg (160 lb) 70.2 kg (154 lb 12.8 oz) 70.2 kg (154 lb 12.8 oz)    Intake/Output: I/O last 3 completed shifts: In: 550 [I.V.:350; IV Piggyback:200] Out: 100 [Urine:100]   Intake/Output this shift:  Total I/O In: 240 [P.O.:240] Out: -   Physical Exam: General: NAD,   Head: Normocephalic, atraumatic. Moist oral mucosal membranes  Eyes: Anicteric, PERRL  Neck: Supple, trachea midline  Lungs:  Clear to auscultation  Heart: Regular rate and rhythm  Abdomen:  Soft, nontender,   Extremities: Right heel wound vac  Neurologic: Nonfocal, moving all four extremities  Skin: No lesions  Access: Right AVF    Basic Metabolic Panel:  Recent Labs Lab 02/14/16 1601 02/15/16 0420 02/16/16 0500  NA 136 138 137  K 3.6 4.1 4.4  CL 98* 102 100*  CO2 24 24 22   GLUCOSE 174* 61* 46*  BUN 43* 55* 76*  CREATININE 3.90* 4.79* 5.89*  CALCIUM 8.2* 7.7* 8.0*    Liver Function Tests:  Recent Labs Lab 02/14/16 1601  AST 39  ALT 21  ALKPHOS 82  BILITOT 0.4  PROT 8.2*  ALBUMIN 3.3*   No results for input(s): LIPASE, AMYLASE in the last 168 hours. No results for input(s): AMMONIA in the last 168 hours.  CBC:  Recent Labs Lab 02/14/16 1601 02/15/16 0420 02/16/16 0500  WBC 9.3 8.0 10.0  HGB 10.2* 9.4* 10.2*  HCT 31.2* 28.2* 30.8*  MCV 89.5 90.6 90.5  PLT 263 243 259    Cardiac Enzymes: No results for input(s): CKTOTAL, CKMB, CKMBINDEX, TROPONINI in the last 168  hours.  BNP: Invalid input(s): POCBNP  CBG:  Recent Labs Lab 02/16/16 1126 02/16/16 1319 02/16/16 1657 02/16/16 2122 02/17/16 0845  GLUCAP 62* 117* 153* 206* 166*    Microbiology: Results for orders placed or performed during the hospital encounter of 02/14/16  Blood culture (routine x 2)     Status: None (Preliminary result)   Collection Time: 02/14/16  8:06 PM  Result Value Ref Range Status   Specimen Description BLOOD LEFT HAND  Final   Special Requests BOTTLES DRAWN AEROBIC AND ANAEROBIC 7CC  Final   Culture NO GROWTH 3 DAYS  Final   Report Status PENDING  Incomplete  Blood culture (routine x 2)     Status: None (Preliminary result)   Collection Time: 02/14/16  8:06 PM  Result Value Ref Range Status   Specimen Description BLOOD LEFT WRIST  Final   Special Requests BOTTLES DRAWN AEROBIC AND ANAEROBIC 7CC  Final   Culture NO GROWTH 3 DAYS  Final   Report Status PENDING  Incomplete  Aerobic/Anaerobic Culture (surgical/deep wound)     Status: None (Preliminary result)   Collection Time: 02/16/16  8:40 AM  Result Value Ref Range Status   Specimen Description HEEL RIGHT  Final   Special Requests NONE  Final   Gram Stain   Final    ABUNDANT WBC PRESENT, PREDOMINANTLY  PMN FEW GRAM POSITIVE COCCI IN PAIRS FEW GRAM NEGATIVE RODS RARE GRAM POSITIVE RODS [facility]    Culture PENDING  Incomplete   Report Status PENDING  Incomplete  Aerobic/Anaerobic Culture (surgical/deep wound)     Status: None (Preliminary result)   Collection Time: 02/16/16  8:42 AM  Result Value Ref Range Status   Specimen Description HEEL RIGHT  Final   Special Requests NONE  Final   Gram Stain   Final    ABUNDANT WBC PRESENT, PREDOMINANTLY PMN ABUNDANT GRAM POSITIVE COCCI IN PAIRS IN CLUSTERS MODERATE GRAM NEGATIVE RODS FEW GRAM POSITIVE RODS Performed [facility]    Culture PENDING  Incomplete   Report Status PENDING  Incomplete    Coagulation  Studies:  Recent Labs  02/14/16 2201 02/15/16 0420 02/16/16 0500 02/17/16 0413  LABPROT 27.9* 26.0* 20.4* 20.6*  INR 2.55 2.33 1.72 1.74    Urinalysis: No results for input(s): COLORURINE, LABSPEC, PHURINE, GLUCOSEU, HGBUR, BILIRUBINUR, KETONESUR, PROTEINUR, UROBILINOGEN, NITRITE, LEUKOCYTESUR in the last 72 hours.  Invalid input(s): APPERANCEUR    Imaging: No results found.   Medications:     . calcium acetate  1,334 mg Oral TID WC  . diltiazem  120 mg Oral BH-q7a  . docusate sodium  100 mg Oral BID  . famotidine  10 mg Oral Daily  . furosemide  80 mg Oral BID  . gabapentin  100 mg Oral Once per day on Sun Tue Thu Sat  . gabapentin  300 mg Oral Once per day on Mon Wed Fri  . insulin aspart  0-9 Units Subcutaneous TID WC  . insulin detemir  6 Units Subcutaneous BID  . lactulose  30 g Oral BID  . multivitamin  1 tablet Oral Daily  . omega-3 acid ethyl esters  2 g Oral Daily  . PARoxetine  40 mg Oral QHS  . piperacillin-tazobactam (ZOSYN)  IV  3.375 g Intravenous Q12H  . pravastatin  20 mg Oral Daily  . senna  1 tablet Oral Daily  . sodium chloride flush  3 mL Intravenous Q12H  . vancomycin  750 mg Intravenous Q M,W,F-HD  . warfarin  3.5 mg Oral ONCE-1800  . Warfarin - Physician Dosing Inpatient   Does not apply q1800   sodium chloride, acetaminophen **OR** acetaminophen, acetaminophen, albuterol, lidocaine-prilocaine, ondansetron **OR** ondansetron (ZOFRAN) IV, sodium chloride flush, traMADol  Assessment/ Plan:  [name] is a 82 y.o. white female with atrial fibrillation on Coumadin, hypertension, gout, insulin-dependent diabetes mellitus type 2, diabetic neuropathy, CVA with right visual field blindness, hyperlipidemia, major depressive disorder, overactive bladder with incontinence, generalized anxiety disorder, congestive heart failure diastolic, parathyroidectomy, and osteoarthritis  MWF [facility] [address] [facility]   1. End Stage Renal Disease: Right arm  AVF - hemodialysis for today - UF as tolerated  2. Peripheral vascular disease: with osteomyelitis: satus post I&D on 9/17 by Dr. Evie LacksEsco - broad spectrum Abx- vancomycin and zosyn- cultures are pending - Appreciate podiatry, ID and vascular input.   3. Hypertension:  Blood pressure at goal - diltiazem and furosemide.   4. Secondary Hyperparathyroidism: with hyperphosphatemia.  - calcium acetate with meals.   5. Anemia of chronic kidney disease: hemoglobin 10.2 - epo with treatment.     LOS: 3 [name] 9/18/201710:48 AM

## 2016-02-17 NOTE — Progress Notes (Signed)
Pre hd assessment  

## 2016-02-17 NOTE — Progress Notes (Signed)
This note also relates to the following rows which could not be included: Pulse Rate - Cannot attach notes to unvalidated device data Resp - Cannot attach notes to unvalidated device data  Post hd vitals

## 2016-02-17 NOTE — Progress Notes (Signed)
[facility] - [place] [facility]   PATIENT NAME: [name]    MR#:  [id number]  DATE OF BIRTH:  [birth year]  SUBJECTIVE:   Patient Postop day #1 for debridement and wound VAC placement.  REVIEW OF SYSTEMS:    Review of Systems  Constitutional: Negative.  Negative for chills, fever and malaise/fatigue.  HENT: Negative.  Negative for ear discharge, ear pain, hearing loss, nosebleeds and sore throat.   Eyes: Negative.  Negative for blurred vision and pain.  Respiratory: Negative.  Negative for cough, hemoptysis, shortness of breath and wheezing.   Cardiovascular: Negative.  Negative for chest pain, palpitations and leg swelling.  Gastrointestinal: Negative.  Negative for abdominal pain, blood in stool, diarrhea, nausea and vomiting.  Genitourinary: Negative.  Negative for dysuria.  Musculoskeletal: Negative.  Negative for back pain.  Skin: Negative.        Wound right lower extremity/Achilles tendon\ Multiple abdominal ulcerations Left lower calcaneus wound  Neurological: Negative for dizziness, tremors, speech change, focal weakness, seizures and headaches.  Endo/Heme/Allergies: Negative.  Does not bruise/bleed easily.  Psychiatric/Behavioral: Negative.  Negative for depression, hallucinations and suicidal ideas.    Tolerating Diet: Yes     DRUG ALLERGIES:   Allergies  Allergen Reactions  . Codeine Other (See Comments)    Reaction:  Confusion/Hallucinations   . Contrast Media [Iodinated Diagnostic Agents] Other (See Comments)    "skin peel"  . Oxycodone Other (See Comments)    Reaction:  Confusion/Hallucinations   . Quinine Derivatives Rash    VITALS:  Blood pressure (!) 128/53, pulse 73, temperature 97.5 F (36.4 C), temperature source Oral, resp. rate 17, height 5\' 5"  (1.651 m), weight 70.2 kg (154 lb 12.8 oz), SpO2 100 %.  PHYSICAL EXAMINATION:   Physical Exam  Constitutional: She is oriented to person, place, and time and well-developed,  well-nourished, and in no distress. No distress.  HENT:  Head: Normocephalic.  Eyes: No scleral icterus.  Neck: Normal range of motion. Neck supple. No JVD present. No tracheal deviation present.  Cardiovascular: Normal heart sounds.  Exam reveals no gallop and no friction rub.   No murmur heard. Irr,irr  Pulmonary/Chest: Effort normal and breath sounds normal. No respiratory distress. She has no wheezes. She has no rales. She exhibits no tenderness.  Abdominal: Soft. Bowel sounds are normal. She exhibits no distension and no mass. There is no tenderness. There is no rebound and no guarding.  Musculoskeletal: Normal range of motion. She exhibits no edema.  Neurological: She is alert and oriented to person, place, and time.  Skin: Skin is warm. No rash noted. No erythema.  Patient with multiple ulcerations in her abdomen which appear well healing,    Psychiatric: Affect and judgment normal.      LABORATORY PANEL:   CBC  Recent Labs Lab 02/16/16 0500  WBC 10.0  HGB 10.2*  HCT 30.8*  PLT 259   ------------------------------------------------------------------------------------------------------------------  Chemistries   Recent Labs Lab 02/14/16 1601  02/16/16 0500  NA 136  < > 137  K 3.6  < > 4.4  CL 98*  < > 100*  CO2 24  < > 22  GLUCOSE 174*  < > 46*  BUN 43*  < > 76*  CREATININE 3.90*  < > 5.89*  CALCIUM 8.2*  < > 8.0*  AST 39  --   --   ALT 21  --   --   ALKPHOS 82  --   --   BILITOT 0.4  --   --   < > =  values in this interval not displayed. ------------------------------------------------------------------------------------------------------------------  Cardiac Enzymes No results for input(s): TROPONINI in the last 168 hours. ------------------------------------------------------------------------------------------------------------------  RADIOLOGY:  No results found.   ASSESSMENT AND PLAN:   82 year old female with a history of severe peripheral  vascular disease, diabetes, ESRD on hemodialysis, atrial fibrillation who presents for evaluation of an ulceration of her right lower extremity and found to have osteomyelitis of calcaneus with a small abscess.  1. 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 Patient is postoperative day #1 with incision and drainage of right heel wound and wound VAC placement. ID consult for disposition planning for antibiotics.  2. Abdominal wounds: These appear well healed. Wound care consult.  3. ESRD and hematemesis: Patient plan for dialysis today   4. Peripheral vascular disease: Appreciate vascular surgery consult. Continue pravastatin.  5. Atrial fibrillation/atrial flutter chronic: Coumadin as per pharmacy dosing. Continue diltiazem for rate control.  6. Diabetes: Last A1c 6.6   Continue ADA diet, sliding scale insulin and Levemir (decreased dose as per recommendations).   7. Depression: Continue Paxil  8. Constipation: Laxatives and stool softeners ordered. Management plans discussed with the patient and she is in agreement. Rounded with nursing  CODE STATUS: full  TOTAL TIME TAKING CARE OF THIS PATIENT: 25 minutes.     POSSIBLE D/C tomorrow, DEPENDING ON CLINICAL CONDITION.   Yosselin Zoeller M.D on 02/17/2016 at 11:23 AM  Between 7am to 6pm - Pager - [phone] After 6pm go to [email or web address] - password [email or web address]  [facility]  Office  [phone]  CC: Primary care physician; Rolm GalaGRANDIS, HEIDI, MD  Note: This dictation was prepared with Dragon dictation along with smaller phrase technology. Any transcriptional errors that result from this process are unintentional.

## 2016-02-17 NOTE — Progress Notes (Signed)
This note also relates to the following rows which could not be included: Pulse Rate - Cannot attach notes to unvalidated device data SpO2 - Cannot attach notes to unvalidated device data  End of hd tx

## 2016-02-17 NOTE — Consult Note (Signed)
[facility] Infectious Disease     Reason for Consult: Heel ulcer   Referring Physician: Bettey Costa  Date of Admission:  02/14/2016   Active Problems:   Osteomyelitis ([place])   HPI: [name] is a 82 y.o. female admitted with R post chronic heel ulcer with CT showing abscess and calcaneus osteomyelitis. She underwent I and D and wound vac placement 9/17.  She has hx PVD, PN, ESRD and DM. She had been following with wound care and on multiple abx as otpt.  Prior wound cultures done as otpt 9/12 with Morganella, enterococcus, proteus and enterobacter.  In August had proteus, enterococcus and enterobacteria as well.  She is seen today in HD. Reports feeling a little better since admission. Does not ambulate.   Past Medical History:  Diagnosis Date  . Anemia   . Arthritis    gout  . Atrial fibrillation ([place])   . CHF (congestive heart failure) ([place])   . Chronic kidney disease   . Diabetes mellitus without complication ([place])   . Dialysis patient ([place])   . Dysrhythmia   . GERD (gastroesophageal reflux disease)   . Hypertension   . Peripheral vascular disease ([place])   . Pleural effusion   . Pulmonary hypertension ([place])   . Renal insufficiency   . Restless leg syndrome   . Shortness of breath dyspnea    with exertion  . Stroke [facility])    Past Surgical History:  Procedure Laterality Date  . AMPUTATION Right 11/07/2015   Procedure: AMPUTATION DIGIT ( 4TH TOE, RIGHT FOOT );  Surgeon: Algernon Huxley, MD;  Location: [facility] ORS;  Service: Vascular;  Laterality: Right;  . AMPUTATION TOE Left 06/07/2015   Procedure: AMPUTATION TOE;  Surgeon: Samara Deist, DPM;  Location: [facility] ORS;  Service: Podiatry;  Laterality: Left;  . CARDIAC CATHETERIZATION    . CATARACT EXTRACTION, BILATERAL    . CHOLECYSTECTOMY    . ENDARTERECTOMY Left 07/26/2015   Procedure: ENDARTERECTOMY CAROTID;  Surgeon: Katha Cabal, MD;  Location: [facility] ORS;  Service: Vascular;  Laterality: Left;  . EYE SURGERY    .  FISTULAGRAM ([place] HX)    . JOINT REPLACEMENT     bilateral hip  . MEDTRONIC BLADDER INTERSTEM     currently turned off for MRI in January  . PARATHYROIDECTOMY    . PERIPHERAL VASCULAR CATHETERIZATION N/A 10/08/2014   Procedure: A/V Shuntogram/Fistulagram;  Surgeon: Algernon Huxley, MD;  Location: [place] CV LAB;  Service: Cardiovascular;  Laterality: N/A;  . PERIPHERAL VASCULAR CATHETERIZATION N/A 03/04/2015   Procedure: A/V Shuntogram/Fistulagram;  Surgeon: Algernon Huxley, MD;  Location: [place] CV LAB;  Service: Cardiovascular;  Laterality: N/A;  . PERIPHERAL VASCULAR CATHETERIZATION N/A 03/04/2015   Procedure: A/V Shunt Intervention;  Surgeon: Algernon Huxley, MD;  Location: [place] CV LAB;  Service: Cardiovascular;  Laterality: N/A;  . PERIPHERAL VASCULAR CATHETERIZATION N/A 09/16/2015   Procedure: Lower Extremity Angiography;  Surgeon: Algernon Huxley, MD;  Location: [place] CV LAB;  Service: Cardiovascular;  Laterality: N/A;  . PERIPHERAL VASCULAR CATHETERIZATION  09/16/2015   Procedure: Lower Extremity Intervention;  Surgeon: Algernon Huxley, MD;  Location: [name] CV LAB;  Service: Cardiovascular;;  . THYROIDECTOMY, PARTIAL    . TONSILLECTOMY    . WOUND DEBRIDEMENT Right 11/07/2015   Procedure: DEBRIDEMENT WOUND ( RIGHT HEEL AND ABDOMINAL WOUND (2) );  Surgeon: Algernon Huxley, MD;  Location: [facility] ORS;  Service: Vascular;  Laterality: Right;  . WOUND DEBRIDEMENT  N/A 11/28/2015   Procedure: DEBRIDEMENT WOUND ( ABDOMINAL );  Surgeon: Algernon Huxley, MD;  Location: [facility] ORS;  Service: Vascular;  Laterality: N/A;   Social History  Substance Use Topics  . Smoking status: Never Smoker  . Smokeless tobacco: Never Used  . Alcohol use No   Family History  Problem Relation Age of Onset  . Colon cancer Mother   . Stroke Father   . Diabetes Brother     Allergies:  Allergies  Allergen Reactions  . Codeine Other (See Comments)    Reaction:  Confusion/Hallucinations   . Contrast Media  [Iodinated Diagnostic Agents] Other (See Comments)    "skin peel"  . Oxycodone Other (See Comments)    Reaction:  Confusion/Hallucinations   . Quinine Derivatives Rash    Current antibiotics: Antibiotics Given (last 72 hours)    Date/Time Action Medication Dose Rate   02/15/16 0210 Given   vancomycin (VANCOCIN) 500 mg in sodium chloride 0.9 % 100 mL IVPB 500 mg 100 mL/hr   02/15/16 1142 Given   piperacillin-tazobactam (ZOSYN) IVPB 3.375 g 3.375 g 12.5 mL/hr   02/15/16 2139 Given   piperacillin-tazobactam (ZOSYN) IVPB 3.375 g 3.375 g 12.5 mL/hr   02/16/16 0845 Given   piperacillin-tazobactam (ZOSYN) IVPB 3.375 g 3.375 g    02/16/16 2047 Given   piperacillin-tazobactam (ZOSYN) IVPB 3.375 g 3.375 g 12.5 mL/hr   02/17/16 0727 Given   piperacillin-tazobactam (ZOSYN) IVPB 3.375 g 3.375 g 12.5 mL/hr      MEDICATIONS: . calcium acetate  1,334 mg Oral TID WC  . diltiazem  120 mg Oral BH-q7a  . docusate sodium  100 mg Oral BID  . famotidine  10 mg Oral Daily  . furosemide  80 mg Oral BID  . gabapentin  100 mg Oral Once per day on Sun Tue Thu Sat  . gabapentin  300 mg Oral Once per day on Mon Wed Fri  . insulin aspart  0-9 Units Subcutaneous TID WC  . insulin detemir  6 Units Subcutaneous BID  . lactulose  30 g Oral BID  . multivitamin  1 tablet Oral Daily  . omega-3 acid ethyl esters  2 g Oral Daily  . PARoxetine  40 mg Oral QHS  . piperacillin-tazobactam (ZOSYN)  IV  3.375 g Intravenous Q12H  . pravastatin  20 mg Oral Daily  . senna  1 tablet Oral Daily  . sodium chloride flush  3 mL Intravenous Q12H  . vancomycin  750 mg Intravenous Q M,W,F-HD  . warfarin  3.5 mg Oral ONCE-1800  . Warfarin - Physician Dosing Inpatient   Does not apply q1800    Review of Systems - 11 systems reviewed and negative per HPI   OBJECTIVE: Temp:  [97.5 F (36.4 C)-99.1 F (37.3 C)] 97.6 F (36.4 C) (09/18 1342) Pulse Rate:  [69-110] 69 (09/18 1342) Resp:  [14-18] 18 (09/18 1342) BP:  (101-128)/(47-83) 117/53 (09/18 1342) SpO2:  [93 %-100 %] 100 % (09/18 1342) CONSTITUTIONAL: No fever, fatigue or weakness.  EYES: No blurred or double vision.  EARS, NOSE, AND THROAT: No tinnitus or ear pain.  RESPIRATORY: No cough, shortness of breath, wheezing or hemoptysis.  CARDIOVASCULAR: No chest pain, orthopnea, edema.  GASTROINTESTINAL: No nausea, vomiting, diarrhea or abdominal pain.  GENITOURINARY: No dysuria, hematuria.  ENDOCRINE: No polyuria, nocturia,  HEMATOLOGY: No anemia, easy bruising or bleeding SKIN: R heel with wound vac in place  bil abd in skin folds of pannus has 2 linear wounds with dry  base, no drainage.  MUSCULOSKELETAL: No joint pain or arthritis.   NEUROLOGIC: No tingling, numbness, weakness.  PSYCHIATRY: No anxiety or depression.   LABS: Results for orders placed or performed during the hospital encounter of 02/14/16 (from the past 48 hour(s))  Glucose, capillary     Status: Abnormal   Collection Time: 02/15/16  4:48 PM  Result Value Ref Range   Glucose-Capillary 115 (H) 65 - 99 mg/dL   Comment 1 Notify RN   Glucose, capillary     Status: Abnormal   Collection Time: 02/15/16  9:25 PM  Result Value Ref Range   Glucose-Capillary 116 (H) 65 - 99 mg/dL   Comment 1 Notify RN   Protime-INR     Status: Abnormal   Collection Time: 02/16/16  5:00 AM  Result Value Ref Range   Prothrombin Time 20.4 (H) 11.4 - 15.2 seconds   INR 1.72   CBC     Status: Abnormal   Collection Time: 02/16/16  5:00 AM  Result Value Ref Range   WBC 10.0 3.6 - 11.0 K/uL   RBC 3.40 (L) 3.80 - 5.20 MIL/uL   Hemoglobin 10.2 (L) 12.0 - 16.0 g/dL   HCT 30.8 (L) 35.0 - 47.0 %   MCV 90.5 80.0 - 100.0 fL   MCH 30.1 26.0 - 34.0 pg   MCHC 33.2 32.0 - 36.0 g/dL   RDW 19.3 (H) 11.5 - 14.5 %   Platelets 259 150 - 440 K/uL  Basic metabolic panel     Status: Abnormal   Collection Time: 02/16/16  5:00 AM  Result Value Ref Range   Sodium 137 135 - 145 mmol/L   Potassium 4.4 3.5 - 5.1  mmol/L   Chloride 100 (L) 101 - 111 mmol/L   CO2 22 22 - 32 mmol/L   Glucose, Bld 46 (L) 65 - 99 mg/dL   BUN 76 (H) 6 - 20 mg/dL   Creatinine, Ser 5.89 (H) 0.44 - 1.00 mg/dL   Calcium 8.0 (L) 8.9 - 10.3 mg/dL   GFR calc non Af Amer 6 (L) >60 mL/min   GFR calc Af Amer 7 (L) >60 mL/min    Comment: (NOTE) The eGFR has been calculated using the CKD EPI equation. This calculation has not been validated in all clinical situations. eGFR's persistently <60 mL/min signify possible Chronic Kidney Disease.    Anion gap 15 5 - 15  Glucose, capillary     Status: Abnormal   Collection Time: 02/16/16  8:05 AM  Result Value Ref Range   Glucose-Capillary 40 (LL) 65 - 99 mg/dL  Aerobic/Anaerobic Culture (surgical/deep wound)     Status: None (Preliminary result)   Collection Time: 02/16/16  8:40 AM  Result Value Ref Range   Specimen Description HEEL RIGHT    Special Requests NONE    Gram Stain      ABUNDANT WBC PRESENT, PREDOMINANTLY PMN FEW GRAM POSITIVE COCCI IN PAIRS FEW GRAM NEGATIVE RODS RARE GRAM POSITIVE RODS Performed at [facility]    Culture PENDING    Report Status PENDING   Aerobic/Anaerobic Culture (surgical/deep wound)     Status: None (Preliminary result)   Collection Time: 02/16/16  8:42 AM  Result Value Ref Range   Specimen Description HEEL RIGHT    Special Requests NONE    Gram Stain      ABUNDANT WBC PRESENT, PREDOMINANTLY PMN ABUNDANT GRAM POSITIVE COCCI IN PAIRS IN CLUSTERS MODERATE GRAM NEGATIVE RODS FEW GRAM POSITIVE RODS Performed at [facility]  Culture PENDING    Report Status PENDING   Glucose, capillary     Status: None   Collection Time: 02/16/16  9:09 AM  Result Value Ref Range   Glucose-Capillary 96 65 - 99 mg/dL  Glucose, capillary     Status: Abnormal   Collection Time: 02/16/16 11:26 AM  Result Value Ref Range   Glucose-Capillary 62 (L) 65 - 99 mg/dL  Glucose, capillary     Status: Abnormal   Collection Time: 02/16/16  1:19 PM   Result Value Ref Range   Glucose-Capillary 117 (H) 65 - 99 mg/dL  Glucose, capillary     Status: Abnormal   Collection Time: 02/16/16  4:57 PM  Result Value Ref Range   Glucose-Capillary 153 (H) 65 - 99 mg/dL  Glucose, capillary     Status: Abnormal   Collection Time: 02/16/16  9:22 PM  Result Value Ref Range   Glucose-Capillary 206 (H) 65 - 99 mg/dL   Comment 1 Notify RN   Protime-INR     Status: Abnormal   Collection Time: 02/17/16  4:13 AM  Result Value Ref Range   Prothrombin Time 20.6 (H) 11.4 - 15.2 seconds   INR 1.74   Glucose, capillary     Status: Abnormal   Collection Time: 02/17/16  8:45 AM  Result Value Ref Range   Glucose-Capillary 166 (H) 65 - 99 mg/dL  Glucose, capillary     Status: Abnormal   Collection Time: 02/17/16 11:31 AM  Result Value Ref Range   Glucose-Capillary 136 (H) 65 - 99 mg/dL   Comment 1 Notify RN    No components found for: ESR, C REACTIVE PROTEIN MICRO: Recent Results (from the past 720 hour(s))  Aerobic Culture (superficial specimen)     Status: None   Collection Time: 01/21/16 10:49 AM  Result Value Ref Range Status   Specimen Description WOUND HEEL  Final   Special Requests NONE  Final   Gram Stain   Final    NO WBC SEEN ABUNDANT GRAM NEGATIVE COCCOBACILLI MODERATE GRAM POSITIVE COCCI IN PAIRS Performed at [facility]    Culture   Final    ABUNDANT PROTEUS MIRABILIS ABUNDANT ENTEROCOCCUS FAECALIS ABUNDANT ENTEROBACTER SPECIES    Report Status 01/27/2016 FINAL  Final   Organism ID, Bacteria PROTEUS MIRABILIS  Final   Organism ID, Bacteria ENTEROCOCCUS FAECALIS  Final   Organism ID, Bacteria ENTEROBACTER SPECIES  Final      Susceptibility   Enterococcus faecalis - MIC*    AMPICILLIN <=2 SENSITIVE Sensitive     VANCOMYCIN <=0.5 SENSITIVE Sensitive     GENTAMICIN SYNERGY SENSITIVE Sensitive     * ABUNDANT ENTEROCOCCUS FAECALIS   Enterobacter species - MIC*    CEFAZOLIN >=64 RESISTANT Resistant     CEFEPIME <=1  SENSITIVE Sensitive     CEFTAZIDIME <=1 SENSITIVE Sensitive     CEFTRIAXONE <=1 SENSITIVE Sensitive     CIPROFLOXACIN <=0.25 SENSITIVE Sensitive     GENTAMICIN <=1 SENSITIVE Sensitive     IMIPENEM <=0.25 SENSITIVE Sensitive     TRIMETH/SULFA <=20 SENSITIVE Sensitive     PIP/TAZO <=4 SENSITIVE Sensitive     * ABUNDANT ENTEROBACTER SPECIES   Proteus mirabilis - MIC*    AMPICILLIN <=2 SENSITIVE Sensitive     CEFAZOLIN <=4 SENSITIVE Sensitive     CEFEPIME <=1 SENSITIVE Sensitive     CEFTAZIDIME <=1 SENSITIVE Sensitive     CEFTRIAXONE <=1 SENSITIVE Sensitive     CIPROFLOXACIN 0.5 INTERMEDIATE Intermediate  GENTAMICIN <=1 SENSITIVE Sensitive     IMIPENEM 2 SENSITIVE Sensitive     TRIMETH/SULFA 40 SENSITIVE Sensitive     AMPICILLIN/SULBACTAM <=2 SENSITIVE Sensitive     PIP/TAZO <=4 SENSITIVE Sensitive     * ABUNDANT PROTEUS MIRABILIS  Aerobic Culture (superficial specimen)     Status: None   Collection Time: 02/11/16  8:50 AM  Result Value Ref Range Status   Specimen Description LEG  Final   Special Requests NONE  Final   Gram Stain   Final    MODERATE WBC PRESENT,BOTH PMN AND MONONUCLEAR ABUNDANT GRAM NEGATIVE RODS FEW GRAM POSITIVE COCCI IN PAIRS Performed at [facility]    Culture   Final    ABUNDANT ENTEROCOCCUS [place] MORGANII MODERATE PROTEUS MIRABILIS FEW ENTEROBACTER CLOACAE    Report Status 02/15/2016 FINAL  Final   Organism ID, Bacteria MORGANELLA MORGANII  Final   Organism ID, Bacteria ENTEROCOCCUS FAECALIS  Final   Organism ID, Bacteria PROTEUS MIRABILIS  Final   Organism ID, Bacteria ENTEROBACTER CLOACAE  Final      Susceptibility   Enterobacter cloacae - MIC*    CEFAZOLIN >=64 RESISTANT Resistant     CEFEPIME <=1 SENSITIVE Sensitive     CEFTAZIDIME <=1 SENSITIVE Sensitive     CEFTRIAXONE <=1 SENSITIVE Sensitive     CIPROFLOXACIN <=0.25 SENSITIVE Sensitive     GENTAMICIN <=1 SENSITIVE Sensitive     IMIPENEM <=0.25 SENSITIVE  Sensitive     TRIMETH/SULFA <=20 SENSITIVE Sensitive     PIP/TAZO <=4 SENSITIVE Sensitive     * FEW ENTEROBACTER CLOACAE   Enterococcus faecalis - MIC*    AMPICILLIN <=2 SENSITIVE Sensitive     VANCOMYCIN <=0.5 SENSITIVE Sensitive     GENTAMICIN SYNERGY RESISTANT Resistant     * ABUNDANT ENTEROCOCCUS FAECALIS   Morganella morganii - MIC*    AMPICILLIN >=32 RESISTANT Resistant     CEFAZOLIN >=64 RESISTANT Resistant     CEFEPIME <=1 SENSITIVE Sensitive     CEFTAZIDIME >=64 RESISTANT Resistant     CEFTRIAXONE 8 SENSITIVE Sensitive     CIPROFLOXACIN <=0.25 SENSITIVE Sensitive     GENTAMICIN <=1 SENSITIVE Sensitive     IMIPENEM 1 SENSITIVE Sensitive     TRIMETH/SULFA <=20 SENSITIVE Sensitive     AMPICILLIN/SULBACTAM >=32 RESISTANT Resistant     PIP/TAZO <=4 SENSITIVE Sensitive     * MODERATE MORGANELLA MORGANII   Proteus mirabilis - MIC*    AMPICILLIN <=2 SENSITIVE Sensitive     CEFAZOLIN <=4 SENSITIVE Sensitive     CEFEPIME <=1 SENSITIVE Sensitive     CEFTAZIDIME <=1 SENSITIVE Sensitive     CEFTRIAXONE <=1 SENSITIVE Sensitive     CIPROFLOXACIN <=0.25 SENSITIVE Sensitive     GENTAMICIN <=1 SENSITIVE Sensitive     IMIPENEM 1 SENSITIVE Sensitive     TRIMETH/SULFA <=20 SENSITIVE Sensitive     AMPICILLIN/SULBACTAM <=2 SENSITIVE Sensitive     PIP/TAZO <=4 SENSITIVE Sensitive     * MODERATE PROTEUS MIRABILIS  Blood culture (routine x 2)     Status: None (Preliminary result)   Collection Time: 02/14/16  8:06 PM  Result Value Ref Range Status   Specimen Description BLOOD LEFT HAND  Final   Special Requests BOTTLES DRAWN AEROBIC AND ANAEROBIC [place]  Final   Culture NO GROWTH 3 DAYS  Final   Report Status PENDING  Incomplete  Blood culture (routine x 2)     Status: None (Preliminary result)   Collection Time: 02/14/16  8:06 PM  Result Value Ref Range Status   Specimen Description BLOOD LEFT WRIST  Final   Special Requests BOTTLES DRAWN AEROBIC AND ANAEROBIC [place]  Final   Culture NO  GROWTH 3 DAYS  Final   Report Status PENDING  Incomplete  Aerobic/Anaerobic Culture (surgical/deep wound)     Status: None (Preliminary result)   Collection Time: 02/16/16  8:40 AM  Result Value Ref Range Status   Specimen Description HEEL RIGHT  Final   Special Requests NONE  Final   Gram Stain   Final    ABUNDANT WBC PRESENT, PREDOMINANTLY PMN FEW GRAM POSITIVE COCCI IN PAIRS FEW GRAM NEGATIVE RODS RARE GRAM POSITIVE RODS Performed at [facility]    Culture PENDING  Incomplete   Report Status PENDING  Incomplete  Aerobic/Anaerobic Culture (surgical/deep wound)     Status: None (Preliminary result)   Collection Time: 02/16/16  8:42 AM  Result Value Ref Range Status   Specimen Description HEEL RIGHT  Final   Special Requests NONE  Final   Gram Stain   Final    ABUNDANT WBC PRESENT, PREDOMINANTLY PMN ABUNDANT GRAM POSITIVE COCCI IN PAIRS IN CLUSTERS MODERATE GRAM NEGATIVE RODS FEW GRAM POSITIVE RODS Performed at [facility]    Culture PENDING  Incomplete   Report Status PENDING  Incomplete    IMAGING: Dg Os Calcis Right  Result Date: 01/21/2016 CLINICAL DATA:  Nonhealing wound at right heel beginning 3-4 months ago. EXAM: RIGHT OS CALCIS - 2+ VIEW COMPARISON:  None. FINDINGS: Dense vascular calcifications throughout the ankle area. Advanced degenerative changes in the hindfoot. Plantar and posterior calcaneal spurs noted. Soft tissue defects noted overlying the right calcaneus. No underlying bony abnormalities to suggest osteomyelitis. IMPRESSION: No acute bony abnormality. Electronically Signed   By: Rolm Baptise M.D.   On: 01/21/2016 13:45   Ct Head Wo Contrast  Result Date: 02/05/2016 CLINICAL DATA:  82 year old female with fall.  Patient on Coumadin. EXAM: CT HEAD WITHOUT CONTRAST TECHNIQUE: Contiguous axial images were obtained from the base of the skull through the vertex without intravenous contrast. COMPARISON:  Head CT dated 06/22/2015 FINDINGS: There is  mild age-related atrophy chronic microvascular ischemic changes. Right parietal convexity, left occipital, and left parietal convexity old infarct and encephalomalacia noted. There is no acute intracranial hemorrhage. No mass effect or midline shift noted. No extra-axial fluid collections. Right sphenoid sinus retention cyst or polyp. The remainder of the visualized paranasal sinuses and mastoid air cells are clear. The calvarium is intact. Bifrontal hyperostosis frontalis. IMPRESSION: No acute intracranial hemorrhage. Age-related atrophy and chronic microvascular ischemic changes. 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 Electronically Signed   By: Anner Crete M.D.   On: 02/05/2016 18:11   Ct Ankle Right W Contrast  Result Date: 02/13/2016 CLINICAL DATA:  Nonhealing ulcer.  Location not stated. EXAM: CT OF THE RIGHT ANKLE WITH CONTRAST TECHNIQUE: Multidetector CT imaging of the right ankle was performed following the standard protocol during bolus administration of intravenous contrast. CONTRAST:  133m ISOVUE-300 IOPAMIDOL (ISOVUE-300) INJECTION 61% COMPARISON:  Radiographs 05/22/2015 FINDINGS: Marked abnormal soft tissue thickening along the entire posterior aspect of the ankle and hindfoot with destruction of the posterior aspect of the calcaneus consistent with osteomyelitis. The Achilles tendon is likely destroyed by infection also. Soft tissue ulceration noted posteriorly at the level of the superior aspect of the calcaneus. Suspect a small soft tissue abscess measuring 20 x 8.5 mm on series 8, image 112. Diffuse cellulitis. Severe midfoot degenerative changes and partial auto fusion.  Findings consistent with a neuropathic foot. Moderate tibiotalar degenerative changes also but no definite findings for septic arthritis or osteomyelitis involving the ankle. Extensive small vessel calcifications. IMPRESSION: 1. Diffuse enhancing thickened soft tissues along the entire posterior aspect of the hindfoot and ankle  with an open ulcer. Suspect a small abscess measuring 20 x 8.5 mm adjacent to the upper calcaneus. 2. 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 3. Likely destroyed Achilles tendon. 4. Neuropathic changes involving the midfoot. No definite findings for septic arthritis. Electronically Signed   By: Marijo Sanes M.D.   On: 02/13/2016 10:03    Assessment:   [name] is a 82 y.o. female hx PVD, PN, ESRD and DM admitted with r heel abscess, and underlying osteomyeltis, s/p I and D and now with wound vac. Has had mixed cultures as otpt and has been on a course of oral augmentin and an IV abx course ( unclear from notes if it was vanco or ceftriaxone- although CTX not usually dosed at HD). Prior morganella has been augmentin resistant, cipro sensitive, ceftaz R.   Enterococcus was amp and vanco S.  Proteus pan sensitive and enterococcus sensitive to cipro and ceftazidime .  Current cultures are pending.  No abx allergies  SHe also has 2 abd chronic wounds which have been biopsied and do not appear actively infected at this time.   Recommendations Would rec a 2-4 week IV abx course. Given mixed culture would rec treatment at HD with vanco and ceftazidime to avoid placement of a PICC.   WIll need oral cipro to cover the morganella which was R to ceftazidime.  WIll need to fu on cultures from bone done at surgery and consider adjusting.  Cont wound vac Thank you very much for allowing me to participate in the care of this patient. Please call with questions.   Cheral Marker. Ola Spurr, MD

## 2016-02-17 NOTE — Progress Notes (Signed)
Post hd assessment 

## 2016-02-17 NOTE — Progress Notes (Signed)
Pre tx info 

## 2016-02-17 NOTE — Progress Notes (Signed)
ANTICOAGULATION CONSULT NOTE - Initial Consult  Pharmacy Consult for Warfarin Indication: atrial fibrillation   Allergies  Allergen Reactions  . Codeine Other (See Comments)    Reaction:  Confusion/Hallucinations   . Contrast Media [Iodinated Diagnostic Agents] Other (See Comments)    "skin peel"  . Oxycodone Other (See Comments)    Reaction:  Confusion/Hallucinations   . Quinine Derivatives Rash    Patient Measurements: Height: 5\' 5"  (165.1 cm) Weight: 154 lb 12.8 oz (70.2 kg) IBW/kg (Calculated) : 57 Heparin Dosing Weight:   Vital Signs: Temp: 97.5 F (36.4 C) (09/18 0723) Temp Source: Oral (09/18 0723) BP: 128/53 (09/18 0723) Pulse Rate: 73 (09/18 0723)  Labs:  Recent Labs  02/14/16 1601  02/14/16 2201 02/15/16 0420 02/16/16 0500 02/17/16 0413  HGB 10.2*  --   --  9.4* 10.2*  --   HCT 31.2*  --   --  28.2* 30.8*  --   PLT 263  --   --  243 259  --   APTT  --   --  51*  --   --   --   LABPROT  --   < > 27.9* 26.0* 20.4* 20.6*  INR  --   < > 2.55 2.33 1.72 1.74  CREATININE 3.90*  --   --  4.79* 5.89*  --   < > = values in this interval not displayed.  Estimated Creatinine Clearance: 7.5 mL/min (by C-G formula based on SCr of 5.89 mg/dL (H)).   Medical History: Past Medical History:  Diagnosis Date  . Anemia   . Arthritis    gout  . Atrial fibrillation (HCC)   . CHF (congestive heart failure) (HCC)   . Chronic kidney disease   . Diabetes mellitus without complication (HCC)   . Dialysis patient (HCC)   . Dysrhythmia   . GERD (gastroesophageal reflux disease)   . Hypertension   . Peripheral vascular disease (HCC)   . Pleural effusion   . Pulmonary hypertension (HCC)   . Renal insufficiency   . Restless leg syndrome   . Shortness of breath dyspnea    with exertion  . Stroke [facility](HCC)     Medications:  Prescriptions Prior to Admission  Medication Sig Dispense Refill Last Dose  . acetaminophen (TYLENOL) 650 MG CR tablet Take 1,300 mg by mouth 2  (two) times daily.   02/14/2016 at 0815  . Biotin 1 MG CAPS Take 1 mg by mouth daily.    02/13/2016 at Unknown time  . calcium acetate (PHOSLO) 667 MG tablet Take 1,334 mg by mouth 3 (three) times daily with meals. With any food   02/14/2016 at 0815  . diltiazem (TIAZAC) 120 MG 24 hr capsule Take 120 mg by mouth every morning.    02/13/2016 at 0700  . doxycycline (VIBRA-TABS) 100 MG tablet Take 100 mg by mouth 2 (two) times daily.   02/13/2016 at Unknown time  . folic acid-vitamin b complex-vitamin c-selenium-zinc (DIALYVITE) 3 MG TABS tablet Take 1 tablet by mouth daily.   02/13/2016 at Unknown time  . furosemide (LASIX) 80 MG tablet Take 80 mg by mouth 2 (two) times daily.   02/13/2016 at 2200  . gabapentin (NEURONTIN) 100 MG capsule Take 100 mg by mouth at bedtime. Pt takes on Sunday,  Tuesday  ,Thursday and Saturday   02/13/2016 at Unknown time  . insulin detemir (LEVEMIR) 100 UNIT/ML injection Inject 0.11 mLs (11 Units total) into the skin daily. (Patient taking differently: Inject 11 Units into  the skin 2 (two) times daily. ) 10 mL 11 02/14/2016 at 0815  . lidocaine-prilocaine (EMLA) cream Apply 1 application topically as needed (prior to accessing port).    02/14/2016 at Unknown time  . Omega-3 Fatty Acids (FISH OIL) 1200 MG CAPS Take 1,200 mg by mouth at bedtime.    02/13/2016 at Unknown time  . PARoxetine (PAXIL) 40 MG tablet Take 40 mg by mouth at bedtime.    02/13/2016 at Unknown time  . ranitidine (ZANTAC) 150 MG tablet Take 150 mg by mouth at bedtime.    02/13/2016 at Unknown time  . warfarin (COUMADIN) 2 MG tablet Take 1 tablet (2 mg total) by mouth daily. 20 tablet 0 02/13/2016 at 1000  . albuterol (PROVENTIL HFA;VENTOLIN HFA) 108 (90 Base) MCG/ACT inhaler Inhale 1-2 puffs into the lungs every 6 (six) hours as needed for wheezing or shortness of breath.   11/28/2015 at 1000  . gabapentin (NEURONTIN) 300 MG capsule Take 300 mg by mouth at bedtime. Pt takes on  Monday, Wednesday, and Friday after  dialysis.   02/12/2016  . pravastatin (PRAVACHOL) 20 MG tablet Take 1 tablet (20 mg total) by mouth daily. (Patient taking differently: Take 20 mg by mouth at bedtime. ) 30 tablet 5 11/27/2015 at Unknown time  . traMADol (ULTRAM) 50 MG tablet Take 1 tablet (50 mg total) by mouth every 6 (six) hours as needed. 50 tablet 0    Scheduled:  . calcium acetate  1,334 mg Oral TID WC  . diltiazem  120 mg Oral [id number]  . docusate sodium  100 mg Oral BID  . famotidine  10 mg Oral Daily  . furosemide  80 mg Oral BID  . gabapentin  100 mg Oral Once per day on Sun Tue Thu Sat  . gabapentin  300 mg Oral Once per day on Mon Wed Fri  . insulin aspart  0-9 Units Subcutaneous TID WC  . insulin detemir  6 Units Subcutaneous BID  . lactulose  30 g Oral BID  . multivitamin  1 tablet Oral Daily  . omega-3 acid ethyl esters  2 g Oral Daily  . PARoxetine  40 mg Oral QHS  . piperacillin-tazobactam (ZOSYN)  IV  3.375 g Intravenous Q12H  . pravastatin  20 mg Oral Daily  . senna  1 tablet Oral Daily  . sodium chloride flush  3 mL Intravenous Q12H  . vancomycin  750 mg Intravenous Q M,W,F-HD  . warfarin  3.5 mg Oral ONCE-1800  . Warfarin - Physician Dosing Inpatient   Does not apply q1800    Assessment: Pharmacy consulted to dose and monitor warfarin therapy. Patient on warfarin 2 mg PO daily as an outpatient   PT/INR = 1.74  Goal of Therapy:  INR 2-3 Monitor platelets by anticoagulation protocol: Yes   Plan:   Will give warfarin 3.5 mg PO x 1 and will reassess PT/INR with am labs.    Day Greb D 02/17/2016,9:59 AM

## 2016-02-17 NOTE — Progress Notes (Signed)
Pharmacy Antibiotic Note  [name] is a 82 y.o. female admitted on 02/14/2016 with osteomyelitis.  Pharmacy has been consulted for ceftazidime dosing.  Plan: Ceftazidime 2 grams with every HD ordered.  Height: 5\' 5"  (165.1 cm) Weight: 146 lb 9.7 oz (66.5 kg) IBW/kg (Calculated) : 57  Temp (24hrs), Avg:98 F (36.7 C), Min:97.5 F (36.4 C), Max:99.1 F (37.3 C)   Recent Labs Lab 02/14/16 1601 02/14/16 2006 02/15/16 0420 02/16/16 0500  WBC 9.3  --  8.0 10.0  CREATININE 3.90*  --  4.79* 5.89*  LATICACIDVEN  --  1.4  --   --     Estimated Creatinine Clearance: 6.9 mL/min (by C-G formula based on SCr of 5.89 mg/dL (H)).    Allergies  Allergen Reactions  . Codeine Other (See Comments)    Reaction:  Confusion/Hallucinations   . Contrast Media [Iodinated Diagnostic Agents] Other (See Comments)    "skin peel"  . Oxycodone Other (See Comments)    Reaction:  Confusion/Hallucinations   . Quinine Derivatives Rash    Antimicrobials this admission: vancomycin  >>  Zosyn  >> ceftazidime  Dose adjustments this admission:   Microbiology results: 9/15 BCx: NGTD 6/5 MRSA PCR: (-)    Thank you for allowing pharmacy to be a part of this patient's care.  Christalyn Goertz S 02/17/2016 10:58 PM

## 2016-02-17 NOTE — Progress Notes (Signed)
Shift assessment completed at 0730. [name] alert and oriented, resting in bed, in no distress, denied pain. [name] is on room air, lungs clear bilat. S1S2 auscultated, abdomen is soft, bs heard. [name] has abrasion to L shoulder, two foam dressings intact to L elbow area. PIV #22 intact to L fa with site free of redness and swelling. R arm has pink sleeve intact due to dialysis fistula, site is wnl. [name] is wearing incontinence brief, R heels is dressed and wound vac intact, no leak noted. [name] stated that she has some feeling to her R foot, but no pain. R foot has had a previous amputation of a toe, a few dry scabs noted to toes that are exposed. PP are weak bilat but cap refill to bilat feet is wnl. Daughter arrived at bedside. Dr. In on rounds at 0800. Srx2, call bell in reach.

## 2016-02-17 NOTE — Evaluation (Signed)
Physical Therapy Evaluation Patient Details Name: [name] MRN: [id number] DOB: 07/07/1933 Today's Date: 02/17/2016   History of Present Illness  presented to ER for management of chronic R LE wound; admitted with R LE osteomyelitis, status post R heel I & D with wound vac placement (9/17).  Per telephone clarification with Dr. Wyn Quakerew, patient to be TDWB with R LE offloading, post-op shoe donned.  Shoe not available at hospital at this time (MD paged to discuss options; awaiting return call); R LE maintained NWB throughout session until clarified further.  Clinical Impression  Patient alert and oriented; very pleasant and cooperative.  Eager for mobility and OOB efforts as appropriate.  Demonstrates gross strength at least 4-/5 throughout bilat LEs, except R ankle PF 2-/5 (suspect due to Achilles involvement in wound); diminished sensation to light touch mid-calf distally (R > L).  Currently requiring mod assist for bed mobility; mod assist for lift off/lateral scooting edge of bed and scoot pivot transfer over level surfaces. Max cuing/assist for NWB R LE with mobility efforts; unable to maintain without constant assist from therapist. Unable to safely complete standing attempts at this time as a result. Will clarify shoe recommendations with physician upon return page; will continue to maintain NWB until clarification received. Would benefit from skilled PT to address above deficits and promote optimal return to PLOF; recommend transition to STR upon discharge from acute hospitalization.     Follow Up Recommendations SNF    Equipment Recommendations       Recommendations for Other Services       Precautions / Restrictions Precautions Precautions: Fall Precaution Comments: R heel wound vac Restrictions Weight Bearing Restrictions: Yes RLE Weight Bearing: Touchdown weight bearing Other Position/Activity Restrictions: R LE      Mobility  Bed Mobility Overal bed mobility: Needs  Assistance Bed Mobility: Supine to Sit     Supine to sit: Mod assist        Transfers Overall transfer level: Needs assistance   Transfers: Lateral/Scoot Transfers          Lateral/Scoot Transfers: Mod assist General transfer comment: extensive assist for forward weight shift, lift off and lateral movement; constant cuing/assist for NWB R LE. Unable to maintain NWB R LE with sit/stand efforts  Ambulation/Gait             General Gait Details: unable/unsafe; non-ambulatory at baseline  Stairs            Wheelchair Mobility    Modified Rankin (Stroke Patients Only)       Balance Overall balance assessment: Needs assistance Sitting-balance support: No upper extremity supported;Feet supported Sitting balance-Leahy Scale: Fair     Standing balance support: Bilateral upper extremity supported Standing balance-Leahy Scale: Poor                               Pertinent Vitals/Pain Pain Assessment: No/denies pain    Home Living Family/patient expects to be discharged to:: Skilled nursing facility                 Additional Comments: At baseline, patient lives with daughter in single-story home, ramp access for entry/exit.      Prior Function Level of Independence: Needs assistance         Comments: Min/mod assist for SPT to/from seating surfaces (or transport chair); utilizes transport chair as primary mobility for household distances (propels with bilat LEs).  Non-ambulatory at baseline.  Hand Dominance        Extremity/Trunk Assessment   Upper Extremity Assessment: Generalized weakness (R > L UEs grossly limited by chronic, arthritic changes)           Lower Extremity Assessment: Generalized weakness (Gross strength at least 4-/5 throughout bilat LEs, except R ankle PF 2-/5; chronic sensory changes mid-calf distally, R > L)         Communication   Communication: No difficulties  Cognition Arousal/Alertness:  Awake/alert Behavior During Therapy: WFL for tasks assessed/performed Overall Cognitive Status: Within Functional Limits for tasks assessed                      General Comments      Exercises Other Exercises Other Exercises: Sit/stand attempts from edge of bed, mod assist--unable to maintain NWB R LE for continued participation at this time Other Exercises: Partial lift off and lateral scooting edge of bed, NWB R LE, mod assist.  Heavy posterior trunk pushing with exertion at times; constant cuing/assist from therapist for weight shift and lift off. Other Exercises: Supine LE therex, 1x10, AROM for muscular strength/endurance.  Bilat LE heels floated throughout all therex to minimize shearing forces through R foot.   Assessment/Plan    PT Assessment Patient needs continued PT services  PT Problem List Decreased strength;Decreased range of motion;Decreased activity tolerance;Decreased balance;Decreased mobility;Decreased coordination;Decreased knowledge of use of DME;Decreased safety awareness;Decreased knowledge of precautions;Pain;Decreased skin integrity          PT Treatment Interventions DME instruction;Functional mobility training;Therapeutic activities;Therapeutic exercise;Balance training;Patient/family education    PT Goals (Current goals can be found in the Care Plan section)  Acute Rehab PT Goals Patient Stated Goal: to go to rehab before going home PT Goal Formulation: With patient/family Time For Goal Achievement: 03/02/16 Potential to Achieve Goals: Good    Frequency Min 2X/week   Barriers to discharge Decreased caregiver support      Co-evaluation               End of Session Equipment Utilized During Treatment: Gait belt Activity Tolerance: Patient tolerated treatment well (limited by inability to maintain NWB R LE at this time) Patient left: in chair;with call bell/phone within reach;with chair alarm set Nurse Communication: Mobility status  (need for additional post op shoe/alternative recommendations from Dr. Wyn Quaker; technique for return transfer to bed)         Time: 1011-1053 PT Time Calculation (min) (ACUTE ONLY): 42 min   Charges:   PT Evaluation $PT Eval Moderate Complexity: 1 Procedure PT Treatments $Therapeutic Exercise: 8-22 mins $Therapeutic Activity: 8-22 mins   PT G Codes:        Sherell Christoffel H. Manson Passey, PT, DPT, NCS 02/17/16, 11:27 AM [phone]

## 2016-02-17 NOTE — Progress Notes (Signed)
Start of hd tx  

## 2016-02-17 NOTE — Plan of Care (Signed)
Problem: Tissue Perfusion: Goal: Risk factors for ineffective tissue perfusion will decrease Outcome: Progressing Pt continues to progress toward goals, remains free of falls/injury this shift. Anticipated d/c tomorrow to snf.

## 2016-02-17 NOTE — Progress Notes (Signed)
Pt returned from dialysis at 1830, po meds given and dinner ordered for pt. Pt reportedly had 1.5liters taken off in dialysis. Anticipated d/c for pt tomorrow to snf, pt's daughter spoke to physicians and social work to select facility. Hob is elevated, call bell in reach.

## 2016-02-18 ENCOUNTER — Ambulatory Visit: Payer: Medicare Other | Admitting: Internal Medicine

## 2016-02-18 DIAGNOSIS — L8990 Pressure ulcer of unspecified site, unspecified stage: Secondary | ICD-10-CM | POA: Insufficient documentation

## 2016-02-18 LAB — CBC
HEMATOCRIT: 28.5 % — AB (ref 35.0–47.0)
HEMOGLOBIN: 9.6 g/dL — AB (ref 12.0–16.0)
MCH: 30.2 pg (ref 26.0–34.0)
MCHC: 33.8 g/dL (ref 32.0–36.0)
MCV: 89.2 fL (ref 80.0–100.0)
Platelets: 217 10*3/uL (ref 150–440)
RBC: 3.19 MIL/uL — ABNORMAL LOW (ref 3.80–5.20)
RDW: 18.9 % — AB (ref 11.5–14.5)
WBC: 8.5 10*3/uL (ref 3.6–11.0)

## 2016-02-18 LAB — PROTIME-INR
INR: 1.7
Prothrombin Time: 20.2 seconds — ABNORMAL HIGH (ref 11.4–15.2)

## 2016-02-18 LAB — GLUCOSE, CAPILLARY: GLUCOSE-CAPILLARY: 79 mg/dL (ref 65–99)

## 2016-02-18 MED ORDER — DEXTROSE 5 % IV SOLN: 2.0000 g | INTRAVENOUS | 0 refills | Status: AC | PRN

## 2016-02-18 MED ORDER — CIPROFLOXACIN HCL 250 MG PO TABS: 500.0000 mg | ORAL_TABLET | Freq: Once | ORAL | Status: DC

## 2016-02-18 MED ORDER — CIPROFLOXACIN HCL 500 MG PO TABS: 500.0000 mg | ORAL_TABLET | Freq: Every day | ORAL | 0 refills | Status: DC

## 2016-02-18 MED ORDER — WARFARIN SODIUM 2.5 MG PO TABS: 3.5000 mg | ORAL_TABLET | Freq: Once | ORAL | Status: DC

## 2016-02-18 MED ORDER — WARFARIN SODIUM 4 MG PO TABS: 4.0000 mg | ORAL_TABLET | Freq: Once | ORAL | Status: DC

## 2016-02-18 MED ORDER — WARFARIN SODIUM 7.5 MG PO TABS: 3.5000 mg | ORAL_TABLET | Freq: Once | ORAL | 0 refills | Status: DC

## 2016-02-18 MED ORDER — INSULIN DETEMIR 100 UNIT/ML ~~LOC~~ SOLN: 6.0000 [IU] | Freq: Two times a day (BID) | SUBCUTANEOUS | 11 refills | Status: AC

## 2016-02-18 MED ORDER — CIPROFLOXACIN HCL 250 MG PO TABS: 500.0000 mg | ORAL_TABLET | Freq: Every day | ORAL | Status: DC

## 2016-02-18 MED ORDER — COLLAGENASE 250 UNIT/GM EX OINT: 1.0000 "application " | TOPICAL_OINTMENT | Freq: Every day | CUTANEOUS | 0 refills | Status: DC

## 2016-02-18 MED ORDER — VANCOMYCIN HCL IN DEXTROSE 750-5 MG/150ML-% IV SOLN: 750.0000 mg | INTRAVENOUS | 0 refills | Status: AC

## 2016-02-18 MED ORDER — WARFARIN - PHARMACIST DOSING INPATIENT: Freq: Every day | Status: DC

## 2016-02-18 MED ORDER — PRAVASTATIN SODIUM 20 MG PO TABS: 20.0000 mg | ORAL_TABLET | Freq: Every day | ORAL | 0 refills | Status: DC

## 2016-02-18 NOTE — Progress Notes (Addendum)
Patient is medically stable for D/C today. Clinical Social Worker (CSW) met with patient to get SNF choice. Per patient her daughter [name] is at [facility] this morning touring the facility. Per patient it is between [facility] and [facility]. CSW emphasized that patient is discharged today and a choice needs to be made by 10 am. Patient verbalized her understanding. CSW left patient's daughter [name] a Advertising account executive. CSW will continue to follow and assist as needed.   Patient's daughter [name] called CSW back and reported that she has chosen [facility]. CSW left Armed forces operational officer at [facility] a voicemail.   [employer], LCSW [phone]

## 2016-02-18 NOTE — Progress Notes (Signed)
LATE ENTRY:  CSW met with patient's daughter at bedside. Patient was in diaylsis. Presented bed offers. Stated she'd like to review bed offers to patient this evening and have a decision in the morning. Patient's daughter to discharge with IV vancomycin. Per MD Ola Spurr patient will get IV vancomycin during dialysis. CSW will continue to follow and assist.  Ernest Pine, MSW, LCSW, [place] Social Worker [phone]

## 2016-02-18 NOTE — Progress Notes (Signed)
Patient discharged to SNF. DC instructions provided and explained. Medications reviewed. Rx given, all questions answered. Dressing changed to all wounds. Patient tolerated without complaints. PO boot to Right foot. Pt stable at discharge.

## 2016-02-18 NOTE — Progress Notes (Signed)
 Vein and Vascular Surgery  Daily Progress Note   Subjective  - 2 Days Post-Op  Doing well VAC with good seal.   No pain Abdominal wounds doing well too  Objective Vitals:   02/17/16 2005 02/18/16 0428 02/18/16 0614 02/18/16 0742  BP: 120/66 (!) 92/49 (!) 122/58 (!) 115/57  Pulse: (!) 105 81 86 84  Resp: 16 18  18   Temp: 98.9 F (37.2 C) 98 F (36.7 C)  98.6 F (37 C)  TempSrc: Oral Oral  Oral  SpO2: 97% 94%  97%  Weight:      Height:        Intake/Output Summary (Last 24 hours) at 02/18/16 0912 Last data filed at 02/18/16 0631  Gross per 24 hour  Intake                3 ml  Output             1500 ml  Net            -1497 ml    PULM  CTAB CV  RRR VASC  VAC with good seal, foot warm  Laboratory CBC    Component Value Date/Time   WBC 8.5 02/18/2016 0504   HGB 9.6 (L) 02/18/2016 0504   HGB 14.2 12/03/2013 0820   HCT 28.5 (L) 02/18/2016 0504   HCT 42.7 12/03/2013 0820   PLT 217 02/18/2016 0504   PLT 214 12/03/2013 0820    BMET    Component Value Date/Time   NA 137 02/16/2016 0500   NA 137 07/04/2014 1303   K 4.4 02/16/2016 0500   K 3.3 (L) 07/04/2014 1303   CL 100 (L) 02/16/2016 0500   CL 100 07/04/2014 1303   CO2 22 02/16/2016 0500   CO2 29 07/04/2014 1303   GLUCOSE 46 (L) 02/16/2016 0500   GLUCOSE 76 07/04/2014 1303   BUN 76 (H) 02/16/2016 0500   BUN 44 (H) 07/04/2014 1303   CREATININE 5.89 (H) 02/16/2016 0500   CREATININE 2.72 (H) 07/04/2014 1303   CALCIUM 8.0 (L) 02/16/2016 0500   CALCIUM 8.9 07/04/2014 1303   GFRNONAA 6 (L) 02/16/2016 0500   GFRNONAA 18 (L) 07/04/2014 1303   GFRNONAA 29 (L) 12/02/2013 0847   GFRAA 7 (L) 02/16/2016 0500   GFRAA 22 (L) 07/04/2014 1303   GFRAA 34 (L) 12/02/2013 0847    Assessment/Planning: POD #2 s/p right heel debridement for abscess   Cont VAC therapy at home.  OK for discharge today from my point of view  Will continue ABx  F/U in office in 1-2 weeks for wound  check    [name]  02/18/2016, 9:12 AM

## 2016-02-18 NOTE — Op Note (Signed)
[name]:  [name], [name]               ACCOUNT NO.:  [id number]  MEDICAL RECORD NO.:  [id number]  LOCATION:  152A                         FACILITY:  [facility]  PHYSICIAN:  Bertram DenverMiechia A Esco, MD     DATE OF BIRTH:  Sep 03, 1933  DATE OF PROCEDURE:  02/16/2016 DATE OF DISCHARGE:  02/18/2016                              OPERATIVE REPORT   PREOPERATIVE DIAGNOSIS:  Chronic right lower leg and heel wound with osteomyelitis and abscess.  POSTOPERATIVE DIAGNOSIS:  Chronic right lower leg and heel wound with osteomyelitis and abscess.  SURGEON:  Bertram DenverMiechia A Esco, MD.  ANESTHESIA:  GETA.  PROCEDURE PERFORMED: 1. Non Excisional Debridement of right heel and lower leg approximately 15cm2 wound     of soft tissue. 2. Drainage of abscess. 3. Placement of wound VAC.  DESCRIPTION OF PROCEDURE:  After informed consent was obtained, the patient was taken to the operating room and laid supine on the operating table.  The patient received preoperative antibiotics and the right lower extremity was prepped and draped in usual sterile fashion and a time-out was performed.  The wound again was approximately 15 cm2.  The  central area appeared to have good granulation tissue, however, the distal portion of the heel tracked significantly into the proximal heel and bottom of foot and it also tracked proximal into the mid calf.  There was copious amount of purulent material proximally.  The abscess was drained and sharply debrided via the open cavity that tracked approximately 4 to 5 cm proximally and was debrided sharply with curette.  The surrounding slough and soft tissue was excised with 15 blade.  The heel was also similarly debrided with 15 blade and curettes.  The wound was then copiously irrigated using a Pulsavac with antibiotic irrigation and peroxide and a wound VAC was placed with excellent suction and seal.  Cultures were sent and the patient tolerated the procedure well.     ______________________________ Bertram DenverMiechia A Esco, MD     MAE/MEDQ  D:  02/17/2016  T:  02/18/2016  Job:  [id number]

## 2016-02-18 NOTE — Discharge Summary (Addendum)
[facility] [place] [facility]   PATIENT NAME: [name]    MR#:  [id number]  DATE OF BIRTH:  Oct 11, 1933  DATE OF ADMISSION:  02/14/2016 ADMITTING PHYSICIAN: Auburn Bilberry, MD  DATE OF DISCHARGE: 02/18/2016   PRIMARY CARE PHYSICIAN: [name], MD    ADMISSION DIAGNOSIS:  Acute osteomyelitis of right ankle (HCC) [M86.171]  DISCHARGE DIAGNOSIS:  Active Problems:   Osteomyelitis (HCC)   Pressure injury of skin   SECONDARY DIAGNOSIS:   Past Medical History:  Diagnosis Date  . Anemia   . Arthritis    gout  . Atrial fibrillation (HCC)   . CHF (congestive heart failure) (HCC)   . Chronic kidney disease   . Diabetes mellitus without complication (HCC)   . Dialysis patient (HCC)   . Dysrhythmia   . GERD (gastroesophageal reflux disease)   . Hypertension   . Peripheral vascular disease (HCC)   . Pleural effusion   . Pulmonary hypertension (HCC)   . Renal insufficiency   . Restless leg syndrome   . Shortness of breath dyspnea    with exertion  . Stroke [facility])     HOSPITAL COURSE:    82 year old female with a history of severe peripheral vascular disease, diabetes, ESRD on hemodialysis, atrial fibrillation who presents for evaluation of an ulceration of her right lower extremity and found to have osteomyelitis of calcaneus with a small abscess.  1. 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 Patient is postoperative day #2 with incision and drainage of right heel wound and wound VAC placement. She will follow up with Dr Wyn Quaker for wound vac. She will need IV VANC and CEFTAZ to be given during dialysis for 4 weeks as well as renally dosed CIPRO. This is arranged by Dr Thedore Mins. ID consult for disposition planning for antibiotics. Final wound cultures are pending and therefore adjustments to medications may be needed as an outpatient.   2. 2 chronic Abdominal wounds:  These appear well healed and not infected Wound care consult.  3. ESRD and hematemesis: Patient plan for dialysis Mon, Wed and Fridays 4. Peripheral vascular disease: Appreciate vascular surgery consult. Continue pravastatin.  5. Atrial fibrillation/atrial flutter chronic: Coumadin as per pharmacy dosing. Continue diltiazem for rate control.  WHILE ON CIPRO COUMADIN (INR) NEEDS TO BE MONITORED EVERY 2 days  6. Diabetes: Last A1c 6.6   Continue ADA diet and Levemir (decreased dose as per recommendations).    DISCHARGE CONDITIONS AND DIET:   Stable Diabetic diet  CONSULTS OBTAINED:  Treatment Team:  Lamont Dowdy, MD Bertram Denver, MD Recardo Evangelist, DPM Mick Sell, MD  DRUG ALLERGIES:   Allergies  Allergen Reactions  . Codeine Other (See Comments)    Reaction:  Confusion/Hallucinations   . Contrast Media [Iodinated Diagnostic Agents] Other (See Comments)    "skin peel"  . Oxycodone Other (See Comments)    Reaction:  Confusion/Hallucinations   . Quinine Derivatives Rash    DISCHARGE MEDICATIONS:   Current Discharge Medication List    START taking these medications   Details  cefTAZidime 2 g in dextrose 5 % 50 mL Inject 2 g into the vein every dialysis (osteomyelitis). Qty: 40 g, Refills: 0    ciprofloxacin (CIPRO) 500 MG tablet Take 1 tablet (500 mg total) by mouth daily at 8 pm. Qty: 30 tablet, Refills: 0    Vancomycin (VANCOCIN) 750-5 MG/150ML-% SOLN Inject 150 mLs (750 mg total)  into the vein every Monday, Wednesday, and Friday with hemodialysis. Qty: 4000 mL, Refills: 0      CONTINUE these medications which have CHANGED   Details  insulin detemir (LEVEMIR) 100 UNIT/ML injection Inject 0.06 mLs (6 Units total) into the skin 2 (two) times daily. Qty: 10 mL, Refills: 11    pravastatin (PRAVACHOL) 20 MG tablet Take 1 tablet (20 mg total) by mouth at bedtime. Qty: 30 tablet, Refills: 0    warfarin (COUMADIN) 7.5 MG tablet Take 0.5  tablets (3.75 mg total) by mouth one time only at 6 PM. Qty: 30 tablet, Refills: 0      CONTINUE these medications which have NOT CHANGED   Details  acetaminophen (TYLENOL) 650 MG CR tablet Take 1,300 mg by mouth 2 (two) times daily.    Biotin 1 MG CAPS Take 1 mg by mouth daily.     calcium acetate (PHOSLO) 667 MG tablet Take 1,334 mg by mouth 3 (three) times daily with meals. With any food    diltiazem (TIAZAC) 120 MG 24 hr capsule Take 120 mg by mouth every morning.     folic acid-vitamin b complex-vitamin c-selenium-zinc (DIALYVITE) 3 MG TABS tablet Take 1 tablet by mouth daily.    furosemide (LASIX) 80 MG tablet Take 80 mg by mouth 2 (two) times daily.    !! gabapentin (NEURONTIN) 100 MG capsule Take 100 mg by mouth at bedtime. Pt takes on Sunday,  Tuesday  ,Thursday and Saturday    lidocaine-prilocaine (EMLA) cream Apply 1 application topically as needed (prior to accessing port).     Omega-3 Fatty Acids (FISH OIL) 1200 MG CAPS Take 1,200 mg by mouth at bedtime.     PARoxetine (PAXIL) 40 MG tablet Take 40 mg by mouth at bedtime.     ranitidine (ZANTAC) 150 MG tablet Take 150 mg by mouth at bedtime.     albuterol (PROVENTIL HFA;VENTOLIN HFA) 108 (90 Base) MCG/ACT inhaler Inhale 1-2 puffs into the lungs every 6 (six) hours as needed for wheezing or shortness of breath.    !! gabapentin (NEURONTIN) 300 MG capsule Take 300 mg by mouth at bedtime. Pt takes on  Monday, Wednesday, and Friday after dialysis.    traMADol (ULTRAM) 50 MG tablet Take 1 tablet (50 mg total) by mouth every 6 (six) hours as needed. Qty: 50 tablet, Refills: 0     !! - Potential duplicate medications found. Please discuss with provider.    STOP taking these medications     doxycycline (VIBRA-TABS) 100 MG tablet               Today   CHIEF COMPLAINT:  Doing well his am   VITAL SIGNS:  Blood pressure (!) 115/57, pulse 84, temperature 98.6 F (37 C), temperature source Oral, resp. rate 18,  height 5\' 5"  (1.651 m), weight 66.5 kg (146 lb 9.7 oz), SpO2 97 %.   REVIEW OF SYSTEMS:  Review of Systems  Constitutional: Negative.  Negative for chills, fever and malaise/fatigue.  HENT: Negative.  Negative for ear discharge, ear pain, hearing loss, nosebleeds and sore throat.   Eyes: Negative.  Negative for blurred vision and pain.  Respiratory: Negative.  Negative for cough, hemoptysis, shortness of breath and wheezing.   Cardiovascular: Negative.  Negative for chest pain, palpitations and leg swelling.  Gastrointestinal: Negative.  Negative for abdominal pain, blood in stool, diarrhea, nausea and vomiting.  Genitourinary: Negative.  Negative for dysuria.  Musculoskeletal: Negative.  Negative for back pain.  Skin:  Wound vac to right foot with skin tear left forearm  Neurological: Negative for dizziness, tremors, speech change, focal weakness, seizures and headaches.  Endo/Heme/Allergies: Negative.  Does not bruise/bleed easily.  Psychiatric/Behavioral: Negative.  Negative for depression, hallucinations and suicidal ideas.     PHYSICAL EXAMINATION:  GENERAL:  82 y.o.-year-old patient lying in the bed with no acute distress.  NECK:  Supple, no jugular venous distention. No thyroid enlargement, no tenderness.  LUNGS: Normal breath sounds bilaterally, no wheezing, rales,rhonchi  No use of accessory muscles of respiration.  CARDIOVASCULAR: irr, irr. No murmurs, rubs, or gallops.  ABDOMEN: Soft, non-tender, non-distended. Bowel sounds present. No organomegaly or mass.  EXTREMITIES: No pedal edema, cyanosis, or clubbing.  PSYCHIATRIC: The patient is alert and oriented x 3.  SKIN: skin tear forearm, wound vac  DATA REVIEW:   CBC  Recent Labs Lab 02/18/16 0504  WBC 8.5  HGB 9.6*  HCT 28.5*  PLT 217    Chemistries   Recent Labs Lab 02/14/16 1601  02/16/16 0500  NA 136  < > 137  K 3.6  < > 4.4  CL 98*  < > 100*  CO2 24  < > 22  GLUCOSE 174*  < > 46*  BUN 43*  <  > 76*  CREATININE 3.90*  < > 5.89*  CALCIUM 8.2*  < > 8.0*  AST 39  --   --   ALT 21  --   --   ALKPHOS 82  --   --   BILITOT 0.4  --   --   < > = values in this interval not displayed.  Cardiac Enzymes No results for input(s): TROPONINI in the last 168 hours.  Microbiology Results  @MICRORSLT48 @  RADIOLOGY:  No results found.    Management plans discussed with the patient and she is in agreement. Stable for discharge SNF  Patient should follow up with  Dr dew  CODE STATUS:     Code Status Orders        Start     Ordered   02/14/16 2032  Do not attempt resuscitation (DNR)  Continuous    Question Answer Comment  In the event of cardiac or respiratory ARREST Do not call a "code blue"   In the event of cardiac or respiratory ARREST Do not perform Intubation, CPR, defibrillation or ACLS   In the event of cardiac or respiratory ARREST Use medication by any route, position, wound care, and other measures to relive pain and suffering. May use oxygen, suction and manual treatment of airway obstruction as needed for comfort.      02/14/16 2031    Code Status History    Date Active Date Inactive Code Status Order ID Comments User Context   02/14/2016  8:31 PM 02/14/2016  8:31 PM Full Code [id number]  Auburn Bilberry, MD ED   10/12/2015  5:49 PM 10/15/2015  3:46 PM DNR [id number]  Altamese Dilling, MD Inpatient   09/14/2015 10:46 PM 09/18/2015  8:40 PM Full Code [id number]  Marguarite Arbour, MD Inpatient   08/13/2015  1:01 PM 08/16/2015  4:09 PM Full Code [id number]  Altamese Dilling, MD Inpatient   06/18/2015 11:41 PM 06/25/2015  1:04 AM DNR [id number]  Ramonita Lab, MD Inpatient   06/07/2015  3:46 PM 06/07/2015  6:48 PM Full Code [id number]  Gwyneth Revels, DPM Inpatient   12/11/2014 10:46 PM 12/13/2014  9:38 PM DNR [id number]  Enedina Finner, MD Inpatient   12/11/2014  9:10 PM 12/11/2014 10:46  PM DNR [id number]  Enedina FinnerSona Patel, MD ED    Advance Directive Documentation   Flowsheet Row Most Recent  Value  Type of Advance Directive  Healthcare Power of Attorney, Living will  Pre-existing out of facility DNR order (yellow form or pink MOST form)  No data  "MOST" Form in Place?  No data      TOTAL TIME TAKING CARE OF THIS PATIENT: 38 minutes.    Note: This dictation was prepared with Dragon dictation along with smaller phrase technology. Any transcriptional errors that result from this process are unintentional.  Markice Torbert M.D on 02/18/2016 at 11:17 AM  Between 7am to 6pm - Pager - [phone] After 6pm go to [email or web address] - Social research officer, governmentpassword [email or web address]  [facility]  Office  [phone]  CC: Primary care physician; Rolm GalaGRANDIS, HEIDI, MD

## 2016-02-18 NOTE — Progress Notes (Signed)
[facility]  ROUNDING NOTE   Subjective:   Feels well Right heel I&D done by Dr. Evie LacksEsco 02/16/16 Expected to be discharged today  Objective:  Vital signs in last 24 hours:  Temp:  [97.6 F (36.4 C)-98.9 F (37.2 C)] 98.6 F (37 C) (09/19 0742) Pulse Rate:  [61-105] 84 (09/19 0742) Resp:  [13-24] 18 (09/19 0742) BP: (92-138)/(49-80) 115/57 (09/19 0742) SpO2:  [94 %-100 %] 97 % (09/19 0742) Weight:  [66.5 kg (146 lb 9.7 oz)-69.2 kg (152 lb 8.9 oz)] 66.5 kg (146 lb 9.7 oz) (09/18 1815)  Weight change:  Filed Weights   02/15/16 0431 02/17/16 1435 02/17/16 1815  Weight: 70.2 kg (154 lb 12.8 oz) 69.2 kg (152 lb 8.9 oz) 66.5 kg (146 lb 9.7 oz)    Intake/Output: I/O last 3 completed shifts: In: 343 [P.O.:240; I.V.:3; IV Piggyback:100] Out: 1500 [Other:1500]   Intake/Output this shift:  Total I/O In: 120 [P.O.:120] Out: -   Physical Exam: General: NAD,   Head: Normocephalic, atraumatic. Moist oral mucosal membranes  Eyes: Anicteric,  Neck: Supple, trachea midline  Lungs:  Clear to auscultation  Heart: Regular rate and rhythm  Abdomen:  Soft, nontender,   Extremities: Right heel wound vac  Neurologic: Nonfocal, moving all four extremities  Skin: No lesions  Access: Right AVF    Basic Metabolic Panel:  Recent Labs Lab 02/14/16 1601 02/15/16 0420 02/16/16 0500  NA 136 138 137  K 3.6 4.1 4.4  CL 98* 102 100*  CO2 24 24 22   GLUCOSE 174* 61* 46*  BUN 43* 55* 76*  CREATININE 3.90* 4.79* 5.89*  CALCIUM 8.2* 7.7* 8.0*    Liver Function Tests:  Recent Labs Lab 02/14/16 1601  AST 39  ALT 21  ALKPHOS 82  BILITOT 0.4  PROT 8.2*  ALBUMIN 3.3*   No results for input(s): LIPASE, AMYLASE in the last 168 hours. No results for input(s): AMMONIA in the last 168 hours.  CBC:  Recent Labs Lab 02/14/16 1601 02/15/16 0420 02/16/16 0500 02/18/16 0504  WBC 9.3 8.0 10.0 8.5  HGB 10.2* 9.4* 10.2* 9.6*  HCT 31.2* 28.2* 30.8* 28.5*  MCV 89.5 90.6 90.5  89.2  PLT 263 243 259 217    Cardiac Enzymes: No results for input(s): CKTOTAL, CKMB, CKMBINDEX, TROPONINI in the last 168 hours.  BNP: Invalid input(s): POCBNP  CBG:  Recent Labs Lab 02/16/16 2122 02/17/16 0845 02/17/16 1131 02/17/16 2040 02/18/16 0727  GLUCAP 206* 166* 136* 118* 79    Microbiology: Results for orders placed or performed during the hospital encounter of 02/14/16  Blood culture (routine x 2)     Status: None (Preliminary result)   Collection Time: 02/14/16  8:06 PM  Result Value Ref Range Status   Specimen Description BLOOD LEFT HAND  Final   Special Requests BOTTLES DRAWN AEROBIC AND ANAEROBIC 7CC  Final   Culture NO GROWTH 4 DAYS  Final   Report Status PENDING  Incomplete  Blood culture (routine x 2)     Status: None (Preliminary result)   Collection Time: 02/14/16  8:06 PM  Result Value Ref Range Status   Specimen Description BLOOD LEFT WRIST  Final   Special Requests BOTTLES DRAWN AEROBIC AND ANAEROBIC 7CC  Final   Culture NO GROWTH 4 DAYS  Final   Report Status PENDING  Incomplete  Aerobic/Anaerobic Culture (surgical/deep wound)     Status: None (Preliminary result)   Collection Time: 02/16/16  8:40 AM  Result Value Ref Range Status  Specimen Description HEEL RIGHT  Final   Special Requests NONE  Final   Gram Stain   Final    ABUNDANT WBC PRESENT, PREDOMINANTLY PMN FEW GRAM POSITIVE COCCI IN PAIRS FEW GRAM NEGATIVE RODS RARE GRAM POSITIVE RODS Performed at [facility]    Culture   Final    CULTURE REINCUBATED FOR BETTER GROWTH NO ANAEROBES ISOLATED; CULTURE IN PROGRESS FOR 5 DAYS    Report Status PENDING  Incomplete  Aerobic/Anaerobic Culture (surgical/deep wound)     Status: None (Preliminary result)   Collection Time: 02/16/16  8:42 AM  Result Value Ref Range Status   Specimen Description HEEL RIGHT  Final   Special Requests NONE  Final   Gram Stain   Final    ABUNDANT WBC PRESENT, PREDOMINANTLY PMN ABUNDANT GRAM POSITIVE  COCCI IN PAIRS IN CLUSTERS MODERATE GRAM NEGATIVE RODS FEW GRAM POSITIVE RODS Performed at [facility]    Culture   Final    CULTURE REINCUBATED FOR BETTER GROWTH NO ANAEROBES ISOLATED; CULTURE IN PROGRESS FOR 5 DAYS    Report Status PENDING  Incomplete    Coagulation Studies:  Recent Labs  02/16/16 0500 02/17/16 0413 02/18/16 0504  LABPROT 20.4* 20.6* 20.2*  INR 1.72 1.74 1.70    Urinalysis: No results for input(s): COLORURINE, LABSPEC, PHURINE, GLUCOSEU, HGBUR, BILIRUBINUR, KETONESUR, PROTEINUR, UROBILINOGEN, NITRITE, LEUKOCYTESUR in the last 72 hours.  Invalid input(s): APPERANCEUR    Imaging: No results found.   Medications:     . calcium acetate  1,334 mg Oral TID WC  . ciprofloxacin  500 mg Oral Once  . [START ON 02/19/2016] ciprofloxacin  500 mg Oral Q2000  . diltiazem  120 mg Oral BH-q7a  . docusate sodium  100 mg Oral BID  . famotidine  10 mg Oral Daily  . furosemide  80 mg Oral BID  . gabapentin  100 mg Oral Once per day on Sun Tue Thu Sat  . gabapentin  300 mg Oral Once per day on Mon Wed Fri  . insulin aspart  0-9 Units Subcutaneous TID WC  . insulin detemir  6 Units Subcutaneous BID  . lactulose  30 g Oral BID  . multivitamin  1 tablet Oral Daily  . omega-3 acid ethyl esters  2 g Oral Daily  . PARoxetine  40 mg Oral QHS  . pravastatin  20 mg Oral Daily  . senna  1 tablet Oral Daily  . sodium chloride flush  3 mL Intravenous Q12H  . vancomycin  750 mg Intravenous Q M,W,F-HD  . warfarin  3.5 mg Oral ONCE-1800  . Warfarin - Pharmacist Dosing Inpatient   Does not apply q1800   sodium chloride, acetaminophen **OR** acetaminophen, acetaminophen, albuterol, cefTAZidime (FORTAZ)  IV, lidocaine-prilocaine, ondansetron **OR** ondansetron (ZOFRAN) IV, sodium chloride flush, traMADol  Assessment/ Plan:  [name] is a 82 y.o. white female with atrial fibrillation on Coumadin, hypertension, gout, insulin-dependent diabetes mellitus type 2,  diabetic neuropathy, CVA with right visual field blindness, hyperlipidemia, major depressive disorder, overactive bladder with incontinence, generalized anxiety disorder, congestive heart failure diastolic, parathyroidectomy, and osteoarthritis  MWF [facility] [address]   1. End Stage Renal Disease: Right arm AVF - hemodialysis MWF  2. Osteomyelitis, Peripheral vascular disease:  S/p post I&D on 9/17 by Dr. Evie Lacks - broad spectrum Abx- vancomycin 1 gm and ceftazidime 2 gm  Each treatment x 4 weeks Orders given to Nurse  Loraine Leriche at the outpatient center  3. Hypertension:  Blood pressure  at goal - diltiazem and furosemide.   4. Secondary Hyperparathyroidism: with hyperphosphatemia.  - calcium acetate with meals.   5. Anemia of chronic kidney disease: hemoglobin 9.6 - epo with treatment.     LOS: 4 [name] 9/19/20171:42 PM

## 2016-02-18 NOTE — Progress Notes (Signed)
Infectious Disease Long Term IV Antibiotic Orders  Diagnosis: Heel osteomyeltiis [name] is a 82 y.o. female hx PVD, PN, ESRD and DM admitted with r heel abscess, and underlying osteomyeltis, s/p I and D and now with wound vac. Has had mixed cultures as otpt and has been on a course of oral augmentin and an IV abx course ( unclear from notes if it was vanco or ceftriaxone- although CTX not usually dosed at HD). Prior morganella has been augmentin resistant, cipro sensitive, ceftaz R.   Enterococcus was amp and vanco S.  Proteus pan sensitive and enterococcus sensitive to cipro and ceftazidime .  Current cultures are pending.  No abx allergies  SHe also has 2 abd chronic wounds which have been biopsied and do not appear actively infected at this time.   Recommendations Would rec a 4 week IV abx course. Given mixed culture would rec treatment at HD with vanco and ceftazidime to avoid placement of a PICC.   WIll need oral cipro to cover the morganella which was R to ceftazidime.  WIll need to fu on cultures from bone done at surgery and consider adjusting.  Cont wound vac   Culture results  2d ago  Specimen Description HEEL RIGHT   Special Requests NONE   Gram Stain ABUNDANT WBC PRESENT, PREDOMINANTLY PMN  ABUNDANT GRAM POSITIVE COCCI IN PAIRS IN CLUSTERS  MODERATE GRAM NEGATIVE RODS  FEW GRAM POSITIVE RODS  Performed at [facility]       Culture CULTURE REINCUBATED FOR BETTER GROWTH NO ANAEROBES ISOLATED; CULTURE IN PROGRESS FOR 5 DAYS      Allergies:  Allergies  Allergen Reactions  . Codeine Other (See Comments)    Reaction:  Confusion/Hallucinations   . Contrast Media [Iodinated Diagnostic Agents] Other (See Comments)    "skin peel"  . Oxycodone Other (See Comments)    Reaction:  Confusion/Hallucinations   . Quinine Derivatives Rash    Discharge antibiotics Vancomycin           750       mg  every      HD session   Goal vancomycin trough 15-20.     Pharmacy to adjust dosing based on levels Ceftazidime  2 gm every HD session  Ciprofloxacin 500 mg po QD  PICC Care per protocol Labs weekly while on IV antibiotics      CBC w diff   Comprehensive met panel Vancomycin Trough   CRP    Planned duration of antibiotics 4 weeks   Stop date Oct 16th  Follow up clinic datTBD - pror to stop date  FAX weekly labs to [phone]  Leonel Ramsay, MD

## 2016-02-18 NOTE — Progress Notes (Signed)
Pharmacy Antibiotic Note  [name] is a 82 y.o. female admitted on 02/14/2016 with osteomyelitis.  Pharmacy has been consulted for ceftazidime/Vancomycin and now Cipro dosing. Hemodialysis pt. ID consulted.  Plan: Ceftazidime 2 grams with every HD(MWF). Vancomycin 750mg  IV QHD MWF  Will order Cipro 500mg  po x 1 to be followed by Cipro 500mg  PO Q24h- scheduled in the evening as doses should be given after dialysis on days pt has dialysis. Do not give within 2 hrs of Multivitamin or Phoslo.    Height: 5\' 5"  (165.1 cm) Weight: 146 lb 9.7 oz (66.5 kg) IBW/kg (Calculated) : 57  Temp (24hrs), Avg:98.1 F (36.7 C), Min:97.6 F (36.4 C), Max:98.9 F (37.2 C)   Recent Labs Lab 02/14/16 1601 02/14/16 2006 02/15/16 0420 02/16/16 0500 02/18/16 0504  WBC 9.3  --  8.0 10.0 8.5  CREATININE 3.90*  --  4.79* 5.89*  --   LATICACIDVEN  --  1.4  --   --   --     Estimated Creatinine Clearance: 6.9 mL/min (by C-G formula based on SCr of 5.89 mg/dL (H)).    Allergies  Allergen Reactions  . Codeine Other (See Comments)    Reaction:  Confusion/Hallucinations   . Contrast Media [Iodinated Diagnostic Agents] Other (See Comments)    "skin peel"  . Oxycodone Other (See Comments)    Reaction:  Confusion/Hallucinations   . Quinine Derivatives Rash    Antimicrobials this admission: vancomycin  9/15>>  Zosyn 9/15  >> 9/18 ceftazidime 9/18 Cipro 9/ 19  Dose adjustments this admission:   Microbiology results: 9/15 BCx: NGTD 6/5 MRSA PCR: (-)  See ID note- outpt wound cx: Prior morganella has been augmentin resistant, cipro sensitive, ceftaz R. Enterococcus was amp and vanco S. Proteus pan sensitive and enterococcus sensitive to cipro and ceftazidime .  Current cultures are pending.     Thank you for allowing pharmacy to be a part of this patient's care.  Ladene Allocca A 02/18/2016 10:26 AM

## 2016-02-18 NOTE — Progress Notes (Signed)
Patient is medically stable for D/C [facility] today. Per Gavin Poundeborah admissions coordinator at [facility] will [facility] to room [facility]. RN will call report to [facility]. Patient's daughter Elita Quickam will transport patient in private vehicle. Clinical Child psychotherapistocial Worker (CSW) sent D/C orders to [employer] via [employer]. CSW sent wound vac orders including measurements, pressure and dressing changes to [employer]. Per Gavin Poundeborah the wound vac will be delivered [facility] today within 4-6 hours. RN will take wound vac off at [facility] and put dry dressing on before patient discharges. [facility] RN will apply wound vac when delivered to facility. Patient will be receiving IV ABX at outpatient dialysis center. CSW contacted patient's dialysis center ([facility]) and made them aware of above and sent D/C orders. Per Dr. Wyn Quakerew a regular post op show will be sufficient. RN [facility] before D/C. Patient and her daughter Elita Quickam are aware of above. Please reconsult if future social work needs arise. CSW signing off.   [employer] Ammon Muscatello, LCSW [phone]

## 2016-02-18 NOTE — Care Management Important Message (Signed)
Important Message  Patient Details  Name: [name] MRN: [id number] Date of Birth: 08-12-1933   Medicare Important Message Given:  Yes    Marily MemosLisa M Cristen Bredeson, RN 02/18/2016, 10:38 AM

## 2016-02-18 NOTE — Progress Notes (Signed)
ANTICOAGULATION CONSULT NOTE -follow up Consult  Pharmacy Consult for Warfarin Indication: atrial fibrillation   Allergies  Allergen Reactions  . Codeine Other (See Comments)    Reaction:  Confusion/Hallucinations   . Contrast Media [Iodinated Diagnostic Agents] Other (See Comments)    "skin peel"  . Oxycodone Other (See Comments)    Reaction:  Confusion/Hallucinations   . Quinine Derivatives Rash    Patient Measurements: Height: 5\' 5"  (165.1 cm) Weight: 146 lb 9.7 oz (66.5 kg) IBW/kg (Calculated) : 57 Heparin Dosing Weight:   Vital Signs: Temp: 98 F (36.7 C) (09/19 0428) Temp Source: Oral (09/19 0428) BP: 122/58 (09/19 1610) Pulse Rate: 86 (09/19 0614)  Labs:  Recent Labs  02/16/16 0500 02/17/16 0413 02/18/16 0504  HGB 10.2*  --  9.6*  HCT 30.8*  --  28.5*  PLT 259  --  217  LABPROT 20.4* 20.6* 20.2*  INR 1.72 1.74 1.70  CREATININE 5.89*  --   --     Estimated Creatinine Clearance: 6.9 mL/min (by C-G formula based on SCr of 5.89 mg/dL (H)).   Medical History: Past Medical History:  Diagnosis Date  . Anemia   . Arthritis    gout  . Atrial fibrillation (HCC)   . CHF (congestive heart failure) (HCC)   . Chronic kidney disease   . Diabetes mellitus without complication (HCC)   . Dialysis patient (HCC)   . Dysrhythmia   . GERD (gastroesophageal reflux disease)   . Hypertension   . Peripheral vascular disease (HCC)   . Pleural effusion   . Pulmonary hypertension (HCC)   . Renal insufficiency   . Restless leg syndrome   . Shortness of breath dyspnea    with exertion  . Stroke [facility])     Medications:  Prescriptions Prior to Admission  Medication Sig Dispense Refill Last Dose  . acetaminophen (TYLENOL) 650 MG CR tablet Take 1,300 mg by mouth 2 (two) times daily.   02/14/2016 at 0815  . Biotin 1 MG CAPS Take 1 mg by mouth daily.    02/13/2016 at Unknown time  . calcium acetate (PHOSLO) 667 MG tablet Take 1,334 mg by mouth 3 (three) times daily with  meals. With any food   02/14/2016 at 0815  . diltiazem (TIAZAC) 120 MG 24 hr capsule Take 120 mg by mouth every morning.    02/13/2016 at 0700  . doxycycline (VIBRA-TABS) 100 MG tablet Take 100 mg by mouth 2 (two) times daily.   02/13/2016 at Unknown time  . folic acid-vitamin b complex-vitamin c-selenium-zinc (DIALYVITE) 3 MG TABS tablet Take 1 tablet by mouth daily.   02/13/2016 at Unknown time  . furosemide (LASIX) 80 MG tablet Take 80 mg by mouth 2 (two) times daily.   02/13/2016 at 2200  . gabapentin (NEURONTIN) 100 MG capsule Take 100 mg by mouth at bedtime. Pt takes on Sunday,  Tuesday  ,Thursday and Saturday   02/13/2016 at Unknown time  . insulin detemir (LEVEMIR) 100 UNIT/ML injection Inject 0.11 mLs (11 Units total) into the skin daily. (Patient taking differently: Inject 11 Units into the skin 2 (two) times daily. ) 10 mL 11 02/14/2016 at 0815  . lidocaine-prilocaine (EMLA) cream Apply 1 application topically as needed (prior to accessing port).    02/14/2016 at Unknown time  . Omega-3 Fatty Acids (FISH OIL) 1200 MG CAPS Take 1,200 mg by mouth at bedtime.    02/13/2016 at Unknown time  . PARoxetine (PAXIL) 40 MG tablet Take 40 mg by mouth  at bedtime.    02/13/2016 at Unknown time  . ranitidine (ZANTAC) 150 MG tablet Take 150 mg by mouth at bedtime.    02/13/2016 at Unknown time  . warfarin (COUMADIN) 2 MG tablet Take 1 tablet (2 mg total) by mouth daily. 20 tablet 0 02/13/2016 at 1000  . albuterol (PROVENTIL HFA;VENTOLIN HFA) 108 (90 Base) MCG/ACT inhaler Inhale 1-2 puffs into the lungs every 6 (six) hours as needed for wheezing or shortness of breath.   11/28/2015 at 1000  . gabapentin (NEURONTIN) 300 MG capsule Take 300 mg by mouth at bedtime. Pt takes on  Monday, Wednesday, and Friday after dialysis.   02/12/2016  . pravastatin (PRAVACHOL) 20 MG tablet Take 1 tablet (20 mg total) by mouth daily. (Patient taking differently: Take 20 mg by mouth at bedtime. ) 30 tablet 5 11/27/2015 at Unknown time  .  traMADol (ULTRAM) 50 MG tablet Take 1 tablet (50 mg total) by mouth every 6 (six) hours as needed. 50 tablet 0    Scheduled:  . calcium acetate  1,334 mg Oral TID WC  . diltiazem  120 mg Oral [id number]  . docusate sodium  100 mg Oral BID  . famotidine  10 mg Oral Daily  . furosemide  80 mg Oral BID  . gabapentin  100 mg Oral Once per day on Sun Tue Thu Sat  . gabapentin  300 mg Oral Once per day on Mon Wed Fri  . insulin aspart  0-9 Units Subcutaneous TID WC  . insulin detemir  6 Units Subcutaneous BID  . lactulose  30 g Oral BID  . multivitamin  1 tablet Oral Daily  . omega-3 acid ethyl esters  2 g Oral Daily  . PARoxetine  40 mg Oral QHS  . pravastatin  20 mg Oral Daily  . senna  1 tablet Oral Daily  . sodium chloride flush  3 mL Intravenous Q12H  . vancomycin  750 mg Intravenous Q M,W,F-HD  . warfarin  4 mg Oral ONCE-1800  . Warfarin - Pharmacist Dosing Inpatient   Does not apply q1800    Assessment: Pharmacy consulted to dose and monitor warfarin therapy. Patient on warfarin 2 mg PO daily as an outpatient for atrial fibrillation. Patient admitted 02/14/16 with wound infection/osteomyelitis, hemodialysis pt.   9/16 INR = 2.33  No Warfarin (surgery planned for next day) 9/17 INR = 1.72  Warfarin 2 mg 9/18 INR = 1.74  Warfarin 3.5 mg 9/19 INR = 1.70   Goal of Therapy:   INR 2-3 Monitor platelets by anticoagulation protocol: Yes   Plan:   Will give warfarin 3.5 mg PO again today and will reassess PT/INR with am labs.   Drug interactions: Vancomycin, ceftazidime    Corazon Nickolas A 02/18/2016,7:39 AM

## 2016-02-18 NOTE — Clinical Social Work Placement (Signed)
   CLINICAL SOCIAL WORK PLACEMENT  NOTE  Date:  02/18/2016  Patient Details  Name: [name] MRN: [id number] Date of Birth: [birth year]  Clinical Social Work is seeking post-discharge placement for this patient at the Skilled  Nursing Facility level of care (*CSW will initial, date and re-position this form in  chart as items are completed):  Yes   Patient/family provided with [place] Clinical Social Work Department's list of facilities offering this level of care within the geographic area requested by the patient (or if unable, by the patient's family).  Yes   Patient/family informed of their freedom to choose among providers that offer the needed level of care, that participate in Medicare, Medicaid or managed care program needed by the patient, have an available bed and are willing to accept the patient.  Yes   Patient/family informed of 's ownership interest in [facility] and [facility], as well as of the fact that they are under no obligation to receive care at these facilities.  PASRR submitted to EDS on       PASRR number received on       Existing PASRR number confirmed on 02/16/16     FL2 transmitted to all facilities in geographic area requested by pt/family on 02/16/16     FL2 transmitted to all facilities within larger geographic area on       Patient informed that his/her managed care company has contracts with or will negotiate with certain facilities, including the following:        Yes   Patient/family informed of bed offers received.  Patient chooses bed at  [facility] [facility]     Physician recommends and patient chooses bed at      Patient to be transferred to  [facility] [facility] on 02/18/16.  Patient to be transferred to facility by  (Patient's daughter Elita Quickam will transport patient in private vehicle. )     Patient family notified on 02/18/16 of transfer.  Name of family member notified:   (Patient's daughter Elita Quickam is aware of D/C today. )      PHYSICIAN       Additional Comment:    _______________________________________________ Vincente Asbridge, Darleen CrockerBailey M, LCSW 02/18/2016, 11:31 AM

## 2016-02-19 LAB — CULTURE, BLOOD (ROUTINE X 2)
CULTURE: NO GROWTH
Culture: NO GROWTH

## 2016-02-21 LAB — AEROBIC/ANAEROBIC CULTURE W GRAM STAIN (SURGICAL/DEEP WOUND)

## 2016-02-21 LAB — AEROBIC/ANAEROBIC CULTURE (SURGICAL/DEEP WOUND)

## 2016-02-25 ENCOUNTER — Ambulatory Visit: Payer: Medicare Other | Admitting: Internal Medicine

## 2016-03-03 ENCOUNTER — Encounter (INDEPENDENT_AMBULATORY_CARE_PROVIDER_SITE_OTHER): Payer: Self-pay | Admitting: Vascular Surgery

## 2016-03-03 ENCOUNTER — Encounter: Payer: Medicare Other | Attending: Physician Assistant | Admitting: Physician Assistant

## 2016-03-03 ENCOUNTER — Ambulatory Visit (INDEPENDENT_AMBULATORY_CARE_PROVIDER_SITE_OTHER): Payer: Medicare Other | Admitting: Vascular Surgery

## 2016-03-03 VITALS — BP 117/71 | HR 75 | Resp 16 | Ht 65.0 in | Wt 160.0 lb

## 2016-03-03 DIAGNOSIS — L97319 Non-pressure chronic ulcer of right ankle with unspecified severity: Secondary | ICD-10-CM | POA: Diagnosis not present

## 2016-03-03 DIAGNOSIS — I132 Hypertensive heart and chronic kidney disease with heart failure and with stage 5 chronic kidney disease, or end stage renal disease: Secondary | ICD-10-CM | POA: Diagnosis not present

## 2016-03-03 DIAGNOSIS — E1151 Type 2 diabetes mellitus with diabetic peripheral angiopathy without gangrene: Secondary | ICD-10-CM | POA: Diagnosis not present

## 2016-03-03 DIAGNOSIS — E1122 Type 2 diabetes mellitus with diabetic chronic kidney disease: Secondary | ICD-10-CM | POA: Insufficient documentation

## 2016-03-03 DIAGNOSIS — Z8673 Personal history of transient ischemic attack (TIA), and cerebral infarction without residual deficits: Secondary | ICD-10-CM | POA: Insufficient documentation

## 2016-03-03 DIAGNOSIS — I482 Chronic atrial fibrillation: Secondary | ICD-10-CM | POA: Insufficient documentation

## 2016-03-03 DIAGNOSIS — Y841 Kidney dialysis as the cause of abnormal reaction of the patient, or of later complication, without mention of misadventure at the time of the procedure: Secondary | ICD-10-CM | POA: Diagnosis not present

## 2016-03-03 DIAGNOSIS — E119 Type 2 diabetes mellitus without complications: Secondary | ICD-10-CM

## 2016-03-03 DIAGNOSIS — L89622 Pressure ulcer of left heel, stage 2: Secondary | ICD-10-CM | POA: Diagnosis not present

## 2016-03-03 DIAGNOSIS — I6523 Occlusion and stenosis of bilateral carotid arteries: Secondary | ICD-10-CM

## 2016-03-03 DIAGNOSIS — Z992 Dependence on renal dialysis: Secondary | ICD-10-CM | POA: Insufficient documentation

## 2016-03-03 DIAGNOSIS — L89612 Pressure ulcer of right heel, stage 2: Secondary | ICD-10-CM | POA: Diagnosis not present

## 2016-03-03 DIAGNOSIS — S31104A Unspecified open wound of abdominal wall, left lower quadrant without penetration into peritoneal cavity, initial encounter: Secondary | ICD-10-CM | POA: Insufficient documentation

## 2016-03-03 DIAGNOSIS — Z794 Long term (current) use of insulin: Secondary | ICD-10-CM | POA: Insufficient documentation

## 2016-03-03 DIAGNOSIS — N186 End stage renal disease: Secondary | ICD-10-CM | POA: Diagnosis not present

## 2016-03-03 DIAGNOSIS — I5033 Acute on chronic diastolic (congestive) heart failure: Secondary | ICD-10-CM | POA: Insufficient documentation

## 2016-03-03 DIAGNOSIS — X58XXXA Exposure to other specified factors, initial encounter: Secondary | ICD-10-CM | POA: Insufficient documentation

## 2016-03-03 DIAGNOSIS — T8781 Dehiscence of amputation stump: Secondary | ICD-10-CM | POA: Diagnosis not present

## 2016-03-03 DIAGNOSIS — E11621 Type 2 diabetes mellitus with foot ulcer: Secondary | ICD-10-CM | POA: Insufficient documentation

## 2016-03-03 NOTE — Assessment & Plan Note (Signed)
blood glucose control important in reducing the progression of atherosclerotic disease. Also, involved in wound healing. On appropriate medications.  

## 2016-03-03 NOTE — Assessment & Plan Note (Addendum)
Had debridement about 2 weeks ago at the hospital. Wound is looking much better. Her 2 abdominal wounds are also healing well. At this point, continue local wound care but I do not think she needs the VAC anymore. I will plan to recheck these in about a month.

## 2016-03-03 NOTE — Progress Notes (Signed)
MRN : [id number]  [name] is a 82 y.o. ([birth year]) female who presents with chief complaint of  Chief Complaint  Patient presents with  . Follow-up    [facility] 1week  .  History of Present Illness: Patient returns today in follow up of of her wounds.  About two weeks ago she underwent debridement of her right heel for infection. The wound looks great today. Her 2 abdominal wounds also look really good today with the left-sided wound basically being healed. She does have an area of her dialysis access that has become more aneurysmal.  Current Outpatient Prescriptions  Medication Sig Dispense Refill  . acetaminophen (TYLENOL) 650 MG CR tablet Take 1,300 mg by mouth 2 (two) times daily.    Marland Kitchen albuterol (PROVENTIL HFA;VENTOLIN HFA) 108 (90 Base) MCG/ACT inhaler Inhale 1-2 puffs into the lungs every 6 (six) hours as needed for wheezing or shortness of breath.    . Biotin 1 MG CAPS Take 1 mg by mouth daily.     . calcium acetate (PHOSLO) 667 MG tablet Take 1,334 mg by mouth 3 (three) times daily with meals. With any food    . cefTAZidime 2 g in dextrose 5 % 50 mL Inject 2 g into the vein every dialysis (osteomyelitis). 40 g 0  . ciprofloxacin (CIPRO) 500 MG tablet Take 1 tablet (500 mg total) by mouth daily at 8 pm. 30 tablet 0  . diltiazem (TIAZAC) 120 MG 24 hr capsule Take 120 mg by mouth every morning.     . folic acid-vitamin b complex-vitamin c-selenium-zinc (DIALYVITE) 3 MG TABS tablet Take 1 tablet by mouth daily.    . furosemide (LASIX) 80 MG tablet Take 80 mg by mouth 2 (two) times daily.    Marland Kitchen gabapentin (NEURONTIN) 100 MG capsule Take 100 mg by mouth at bedtime. Pt takes on Sunday,  Tuesday  ,Thursday and Saturday    . gabapentin (NEURONTIN) 300 MG capsule Take 300 mg by mouth at bedtime. Pt takes on  Monday, Wednesday, and Friday after dialysis.    Marland Kitchen insulin detemir (LEVEMIR) 100 UNIT/ML injection Inject 0.06 mLs (6 Units total) into the skin 2 (two) times daily. 10 mL 11  .  lidocaine-prilocaine (EMLA) cream Apply 1 application topically as needed (prior to accessing port).     . Omega-3 Fatty Acids (FISH OIL) 1200 MG CAPS Take 1,200 mg by mouth at bedtime.     Marland Kitchen PARoxetine (PAXIL) 40 MG tablet Take 40 mg by mouth at bedtime.     . pravastatin (PRAVACHOL) 20 MG tablet Take 1 tablet (20 mg total) by mouth at bedtime. 30 tablet 0  . ranitidine (ZANTAC) 150 MG tablet Take 150 mg by mouth at bedtime.     . traMADol (ULTRAM) 50 MG tablet Take 1 tablet (50 mg total) by mouth every 6 (six) hours as needed. 50 tablet 0  . Vancomycin (VANCOCIN) 750-5 MG/150ML-% SOLN Inject 150 mLs (750 mg total) into the vein every Monday, Wednesday, and Friday with hemodialysis. 4000 mL 0  . warfarin (COUMADIN) 7.5 MG tablet Take 0.5 tablets (3.75 mg total) by mouth one time only at 6 PM. 30 tablet 0   No current facility-administered medications for this visit.     Past Medical History:  Diagnosis Date  . Anemia   . Arthritis    gout  . Atrial fibrillation ([place])   . CHF (congestive heart failure) ([place])   . Chronic kidney disease   . Diabetes mellitus without  complication ([place])   . Dialysis patient ([place])   . Dysrhythmia   . GERD (gastroesophageal reflux disease)   . Hypertension   . Peripheral vascular disease ([place])   . Pleural effusion   . Pulmonary hypertension   . Renal insufficiency   . Restless leg syndrome   . Shortness of breath dyspnea    with exertion  . Stroke [facility])     Past Surgical History:  Procedure Laterality Date  . AMPUTATION Right 11/07/2015   Procedure: AMPUTATION DIGIT ( 4TH TOE, RIGHT FOOT );  Surgeon: Algernon Huxley, MD;  Location: [facility] ORS;  Service: Vascular;  Laterality: Right;  . AMPUTATION TOE Left 06/07/2015   Procedure: AMPUTATION TOE;  Surgeon: Samara Deist, DPM;  Location: [facility] OR[facility];  Service: Podiatry;  Laterality: Left;  . CARDIAC CATHETERIZATION    . CATARACT EXTRACTION, BILATERAL    . CHOLECYSTECTOMY    . ENDARTERECTOMY Left 07/26/2015    Procedure: ENDARTERECTOMY CAROTID;  Surgeon: Katha Cabal, MD;  Location: [facility] OR[facility];  Service: Vascular;  Laterality: Left;  . EYE SURGERY    . FISTULAGRAM ([place] HX)    . INCISION AND DRAINAGE ABSCESS Right 02/16/2016   Procedure: INCISION AND DRAINAGE ABSCESS and application wound vac;  Surgeon: Evaristo Bury, MD;  Location: [facility];  Service: Urology;  Laterality: Right;  . JOINT REPLACEMENT     bilateral hip  . MEDTRONIC BLADDER INTERSTEM     currently turned off for MRI in January  . PARATHYROIDECTOMY    . PERIPHERAL VASCULAR CATHETERIZATION N/A 10/08/2014   Procedure: A/V Shuntogram/Fistulagram;  Surgeon: Algernon Huxley, MD;  Location: [place] [facility];  Service: Cardiovascular;  Laterality: N/A;  . PERIPHERAL VASCULAR CATHETERIZATION N/A 03/04/2015   Procedure: A/V Shuntogram/Fistulagram;  Surgeon: Algernon Huxley, MD;  Location: [place] [facility];  Service: Cardiovascular;  Laterality: N/A;  . PERIPHERAL VASCULAR CATHETERIZATION N/A 03/04/2015   Procedure: A/V Shunt Intervention;  Surgeon: Algernon Huxley, MD;  Location: [place] [facility];  Service: Cardiovascular;  Laterality: N/A;  . PERIPHERAL VASCULAR CATHETERIZATION N/A 09/16/2015   Procedure: Lower Extremity Angiography;  Surgeon: Algernon Huxley, MD;  Location: [place] [facility];  Service: Cardiovascular;  Laterality: N/A;  . PERIPHERAL VASCULAR CATHETERIZATION  09/16/2015   Procedure: Lower Extremity Intervention;  Surgeon: Algernon Huxley, MD;  Location: [place] [facility];  Service: Cardiovascular;;  . THYROIDECTOMY, PARTIAL    . TONSILLECTOMY    . WOUND DEBRIDEMENT Right 11/07/2015   Procedure: DEBRIDEMENT WOUND ( RIGHT HEEL AND ABDOMINAL WOUND (2) );  Surgeon: Algernon Huxley, MD;  Location: [facility];  Service: Vascular;  Laterality: Right;  . WOUND DEBRIDEMENT N/A 11/28/2015   Procedure: DEBRIDEMENT WOUND ( ABDOMINAL );  Surgeon: Algernon Huxley, MD;  Location: [facility];  Service: Vascular;  Laterality: N/A;    Social History Social  History  Substance Use Topics  . Smoking status: Never Smoker  . Smokeless tobacco: Never Used  . Alcohol use No   No IV drug use   Family History Family History  Problem Relation Age of Onset  . Colon cancer Mother   . Stroke Father   . Diabetes Brother      Allergies  Allergen Reactions  . Codeine Other (See Comments)    Reaction:  Confusion/Hallucinations   . Contrast Media [Iodinated Diagnostic Agents] Other (See Comments)    "skin peel"  . Oxycodone Other (See Comments)    Reaction:  Confusion/Hallucinations   .  Quinine Derivatives Rash     REVIEW OF SYSTEMS (Negative unless checked)  Constitutional: [] Weight loss  [] Fever  [] Chills Cardiac: [] Chest pain   [] Chest pressure   [] Palpitations   [] Shortness of breath when laying flat   [] Shortness of breath at rest   [] Shortness of breath with exertion. Vascular:  [] Pain in legs with walking   [] Pain in legs at rest   [] Pain in legs when laying flat   [] Claudication   [] Pain in feet when walking  [] Pain in feet at rest  [] Pain in feet when laying flat   [] History of DVT   [] Phlebitis   [] Swelling in legs   [] Varicose veins   [x] Non-healing ulcers Pulmonary:   [] Uses home oxygen   [] Productive cough   [] Hemoptysis   [] Wheeze  [] COPD   [] Asthma Neurologic:  [] Dizziness  [] Blackouts   [] Seizures   [] History of stroke   [] History of TIA  [] Aphasia   [] Temporary blindness   [] Dysphagia   [] Weakness or numbness in arms   [] Weakness or numbness in legs Musculoskeletal:  [] Arthritis   [] Joint swelling   [] Joint pain   [] Low back pain Hematologic:  [] Easy bruising  [] Easy bleeding   [] Hypercoagulable state   [] Anemic   Gastrointestinal:  [] Blood in stool   [] Vomiting blood  [] Gastroesophageal reflux/heartburn   [] Abdominal pain Genitourinary:  [x] Chronic kidney disease   [] Difficult urination  [] Frequent urination  [] Burning with urination   [] Hematuria Skin:  [] Rashes   [x] Ulcers   [x] Wounds Psychological:  [] History of anxiety   []   History of major depression.  Physical Examination  BP 117/71   Pulse 75   Resp 16   Ht 5' 5"  (1.651 m)   Wt 160 lb (72.6 kg)   BMI 26.63 kg/m  Gen:  WD/WN, NAD Head: [place]/AT, No temporalis wasting. Ear/Nose/Throat: Hearing grossly intact, nares w/o erythema or drainage, trachea midline Eyes: PERRLA, EOMI. Sclera non-icteric Neck: Supple, no nuchal rigidity.  No JVD.  Pulmonary:  Good air movement, no use of accessory muscles.  Cardiac: RRR, normal S1, S2 Vascular:  Vessel Right Left  Radial Palpable Palpable  Ulnar Palpable Palpable  Brachial Palpable Palpable  Carotid Palpable, without bruit Palpable, without bruit  Aorta Not palpable N/A  Femoral Palpable Palpable  Popliteal Palpable Palpable  PT Palpable Palpable  DP Palpable Not Palpable   Gastrointestinal: soft, non-tender/non-distended. No guarding/reflex.  Musculoskeletal: M/S 5/5 throughout.  No deformity or atrophy. Neurologic: CN 2-12 intact. Pain and light touch intact in extremities.  Symmetrical.  Speech is fluent.  Psychiatric: Judgment intact, Mood & affect appropriate for pt's clinical situation. Dermatologic: Sided abdominal wound has healed. Right sided abdominal wound has only a less than 1 cm area with excellent granulation tissue. Right heel ulceration has excellent granulation tissue with only about 5-10% slough. No signs of infection. Lymph : No Cervical, Axillary, or Inguinal lymphadenopathy.      Labs Recent Results (from the past 2160 hour(s))  Aerobic Culture (superficial specimen)     Status: None   Collection Time: 12/17/15  9:35 AM  Result Value Ref Range   Specimen Description WOUND    Special Requests NONE    Gram Stain      ABUNDANT WBC PRESENT,BOTH PMN AND MONONUCLEAR ABUNDANT GRAM POSITIVE COCCI IN CLUSTERS IN PAIRS ABUNDANT GRAM NEGATIVE RODS FEW GRAM POSITIVE RODS Performed at [facility] ABUNDANT ENTEROCOCCUS SPECIES     Report Status 12/20/2015  FINAL    Organism ID, Bacteria PROTEUS MIRABILIS    Organism ID, Bacteria ENTEROCOCCUS SPECIES       Susceptibility   Proteus mirabilis - MIC*    AMPICILLIN >=32 RESISTANT Resistant     CEFAZOLIN <=4 SENSITIVE Sensitive     CEFEPIME <=1 SENSITIVE Sensitive     CEFTAZIDIME <=1 SENSITIVE Sensitive     CEFTRIAXONE <=1 SENSITIVE Sensitive     CIPROFLOXACIN >=4 RESISTANT Resistant     GENTAMICIN <=1 SENSITIVE Sensitive     IMIPENEM 2 SENSITIVE Sensitive     TRIMETH/SULFA >=320 RESISTANT Resistant     AMPICILLIN/SULBACTAM <=2 SENSITIVE Sensitive     PIP/TAZO <=4 SENSITIVE Sensitive     * ABUNDANT PROTEUS MIRABILIS   Enterococcus species - MIC*    AMPICILLIN <=2 SENSITIVE Sensitive     VANCOMYCIN <=0.5 SENSITIVE Sensitive     GENTAMICIN SYNERGY SENSITIVE Sensitive     * ABUNDANT ENTEROCOCCUS SPECIES  Surgical pathology     Status: None   Collection Time: 12/31/15 12:00 AM  Result Value Ref Range   SURGICAL PATHOLOGY      Surgical Pathology CASE: [id number] PATIENT: [name] Surgical Pathology Report     SPECIMEN SUBMITTED: A. Midline abdominal wound  CLINICAL HISTORY: None provided  PRE-OPERATIVE DIAGNOSIS: Calciphylaxis  POST-OPERATIVE DIAGNOSIS: None provided.     DIAGNOSIS: A. MIDLINE ABDOMINAL WOUND, SKIN; PUNCH BIOPSY: - ULCERATION, NECROSIS AND INFLAMMATION. - NEGATIVE FOR DYSPLASIA AND MALIGNANCY.   GROSS DESCRIPTION:  A. Labeled: midline abdomen  Tissue fragment(s): 2  Size: 0.2 cm punches excised to a depth of 0.5 and 0.6 cm  Description: tan punches, marked blue  Entirely submitted in one cassette(s).    Final Diagnosis performed by Delorse Lek, MD.  Electronically signed 01/03/2016 11:52:38AM    The electronic signature indicates that the named Attending Pathologist has evaluated the specimen  Technical component performed at [facility], [address], [place], [place] [address] Lab: [phone] Dir: Darrick Penna.  Evette Doffing, MD  Professional c omponent performed at [facility], [facility], [place], [place], [place] [address] Lab: [phone] Dir: Dellia Nims. Rubinas, MD    Aerobic Culture (superficial specimen)     Status: None   Collection Time: 01/21/16 10:49 AM  Result Value Ref Range   Specimen Description WOUND HEEL    Special Requests NONE    Gram Stain      NO WBC SEEN ABUNDANT GRAM NEGATIVE COCCOBACILLI MODERATE GRAM POSITIVE COCCI IN PAIRS Performed at [place] ABUNDANT ENTEROCOCCUS FAECALIS ABUNDANT ENTEROBACTER SPECIES    Report Status 01/27/2016 FINAL    Organism ID, Bacteria PROTEUS MIRABILIS    Organism ID, Bacteria ENTEROCOCCUS FAECALIS    Organism ID, Bacteria ENTEROBACTER SPECIES       Susceptibility   Enterococcus faecalis - MIC*    AMPICILLIN <=2 SENSITIVE Sensitive     VANCOMYCIN <=0.5 SENSITIVE Sensitive     GENTAMICIN SYNERGY SENSITIVE Sensitive     * ABUNDANT ENTEROCOCCUS FAECALIS   Enterobacter species - MIC*    CEFAZOLIN >=64 RESISTANT Resistant     CEFEPIME <=1 SENSITIVE Sensitive     CEFTAZIDIME <=1 SENSITIVE Sensitive     CEFTRIAXONE <=1 SENSITIVE Sensitive     CIPROFLOXACIN <=0.25 SENSITIVE Sensitive     GENTAMICIN <=1 SENSITIVE Sensitive     IMIPENEM <=0.25 SENSITIVE Sensitive     TRIMETH/SULFA <=20 SENSITIVE Sensitive     PIP/TAZO <=4 SENSITIVE Sensitive     *  ABUNDANT ENTEROBACTER SPECIES   Proteus mirabilis - MIC*    AMPICILLIN <=2 SENSITIVE Sensitive     CEFAZOLIN <=4 SENSITIVE Sensitive     CEFEPIME <=1 SENSITIVE Sensitive     CEFTAZIDIME <=1 SENSITIVE Sensitive     CEFTRIAXONE <=1 SENSITIVE Sensitive     CIPROFLOXACIN 0.5 INTERMEDIATE Intermediate     GENTAMICIN <=1 SENSITIVE Sensitive     IMIPENEM 2 SENSITIVE Sensitive     TRIMETH/SULFA 40 SENSITIVE Sensitive     AMPICILLIN/SULBACTAM <=2 SENSITIVE Sensitive     PIP/TAZO <=4 SENSITIVE Sensitive     * ABUNDANT  PROTEUS MIRABILIS  CBC with Differential     Status: Abnormal   Collection Time: 02/05/16  5:36 PM  Result Value Ref Range   WBC 10.1 3.6 - 11.0 K/uL   RBC 3.41 (L) 3.80 - 5.20 MIL/uL   Hemoglobin 10.1 (L) 12.0 - 16.0 g/dL   HCT 31.0 (L) 35.0 - 47.0 %   MCV 90.9 80.0 - 100.0 fL   MCH 29.4 26.0 - 34.0 pg   MCHC 32.4 32.0 - 36.0 g/dL   RDW 20.0 (H) 11.5 - 14.5 %   Platelets 272 150 - 440 K/uL   Neutrophils Relative % 82 %   Neutro Abs 8.3 (H) 1.4 - 6.5 K/uL   Lymphocytes Relative 7 %   Lymphs Abs 0.7 (L) 1.0 - 3.6 K/uL   Monocytes Relative 8 %   Monocytes Absolute 0.8 0.2 - 0.9 K/uL   Eosinophils Relative 2 %   Eosinophils Absolute 0.2 0 - 0.7 K/uL   Basophils Relative 1 %   Basophils Absolute 0.1 0 - 0.1 K/uL  Basic metabolic panel     Status: Abnormal   Collection Time: 02/05/16  5:36 PM  Result Value Ref Range   Sodium 131 (L) 135 - 145 mmol/L   Potassium 3.7 3.5 - 5.1 mmol/L   Chloride 97 (L) 101 - 111 mmol/L   CO2 23 22 - 32 mmol/L   Glucose, Bld 199 (H) 65 - 99 mg/dL   BUN 35 (H) 6 - 20 mg/dL   Creatinine, Ser 3.56 (H) 0.44 - 1.00 mg/dL   Calcium 8.2 (L) 8.9 - 10.3 mg/dL   GFR calc non Af Amer 11 (L) >60 mL/min   GFR calc Af Amer 13 (L) >60 mL/min    Comment: (NOTE) The eGFR has been calculated using the CKD EPI equation. This calculation has not been validated in all clinical situations. eGFR's persistently <60 mL/min signify possible Chronic Kidney Disease.    Anion gap 11 5 - 15  Protime-INR     Status: Abnormal   Collection Time: 02/05/16  5:36 PM  Result Value Ref Range   Prothrombin Time 29.9 (H) 11.4 - 15.2 seconds   INR 2.78   Aerobic Culture (superficial specimen)     Status: None   Collection Time: 02/11/16  8:50 AM  Result Value Ref Range   Specimen Description LEG    Special Requests NONE    Gram Stain      MODERATE WBC PRESENT,BOTH PMN AND MONONUCLEAR ABUNDANT GRAM NEGATIVE RODS FEW GRAM POSITIVE COCCI IN PAIRS Performed at [facility]    Culture      ABUNDANT ENTEROCOCCUS [place] MORGANII MODERATE PROTEUS MIRABILIS FEW ENTEROBACTER CLOACAE    Report Status 02/15/2016 FINAL    Organism ID, Bacteria MORGANELLA MORGANII    Organism ID, Bacteria ENTEROCOCCUS FAECALIS    Organism ID, Bacteria PROTEUS MIRABILIS    Organism  ID, Bacteria ENTEROBACTER CLOACAE       Susceptibility   Enterobacter cloacae - MIC*    CEFAZOLIN >=64 RESISTANT Resistant     CEFEPIME <=1 SENSITIVE Sensitive     CEFTAZIDIME <=1 SENSITIVE Sensitive     CEFTRIAXONE <=1 SENSITIVE Sensitive     CIPROFLOXACIN <=0.25 SENSITIVE Sensitive     GENTAMICIN <=1 SENSITIVE Sensitive     IMIPENEM <=0.25 SENSITIVE Sensitive     TRIMETH/SULFA <=20 SENSITIVE Sensitive     PIP/TAZO <=4 SENSITIVE Sensitive     * FEW ENTEROBACTER CLOACAE   Enterococcus faecalis - MIC*    AMPICILLIN <=2 SENSITIVE Sensitive     VANCOMYCIN <=0.5 SENSITIVE Sensitive     GENTAMICIN SYNERGY RESISTANT Resistant     * ABUNDANT ENTEROCOCCUS FAECALIS   Morganella morganii - MIC*    AMPICILLIN >=32 RESISTANT Resistant     CEFAZOLIN >=64 RESISTANT Resistant     CEFEPIME <=1 SENSITIVE Sensitive     CEFTAZIDIME >=64 RESISTANT Resistant     CEFTRIAXONE 8 SENSITIVE Sensitive     CIPROFLOXACIN <=0.25 SENSITIVE Sensitive     GENTAMICIN <=1 SENSITIVE Sensitive     IMIPENEM 1 SENSITIVE Sensitive     TRIMETH/SULFA <=20 SENSITIVE Sensitive     AMPICILLIN/SULBACTAM >=32 RESISTANT Resistant     PIP/TAZO <=4 SENSITIVE Sensitive     * MODERATE MORGANELLA MORGANII   Proteus mirabilis - MIC*    AMPICILLIN <=2 SENSITIVE Sensitive     CEFAZOLIN <=4 SENSITIVE Sensitive     CEFEPIME <=1 SENSITIVE Sensitive     CEFTAZIDIME <=1 SENSITIVE Sensitive     CEFTRIAXONE <=1 SENSITIVE Sensitive     CIPROFLOXACIN <=0.25 SENSITIVE Sensitive     GENTAMICIN <=1 SENSITIVE Sensitive     IMIPENEM 1 SENSITIVE Sensitive     TRIMETH/SULFA <=20 SENSITIVE Sensitive     AMPICILLIN/SULBACTAM  <=2 SENSITIVE Sensitive     PIP/TAZO <=4 SENSITIVE Sensitive     * MODERATE PROTEUS MIRABILIS  CBC     Status: Abnormal   Collection Time: 02/14/16  4:01 PM  Result Value Ref Range   WBC 9.3 3.6 - 11.0 K/uL   RBC 3.48 (L) 3.80 - 5.20 MIL/uL   Hemoglobin 10.2 (L) 12.0 - 16.0 g/dL   HCT 31.2 (L) 35.0 - 47.0 %   MCV 89.5 80.0 - 100.0 fL   MCH 29.3 26.0 - 34.0 pg   MCHC 32.8 32.0 - 36.0 g/dL   RDW 20.0 (H) 11.5 - 14.5 %   Platelets 263 150 - 440 K/uL  Comprehensive metabolic panel     Status: Abnormal   Collection Time: 02/14/16  4:01 PM  Result Value Ref Range   Sodium 136 135 - 145 mmol/L   Potassium 3.6 3.5 - 5.1 mmol/L   Chloride 98 (L) 101 - 111 mmol/L   CO2 24 22 - 32 mmol/L   Glucose, Bld 174 (H) 65 - 99 mg/dL   BUN 43 (H) 6 - 20 mg/dL   Creatinine, Ser 3.90 (H) 0.44 - 1.00 mg/dL   Calcium 8.2 (L) 8.9 - 10.3 mg/dL   Total Protein 8.2 (H) 6.5 - 8.1 g/dL   Albumin 3.3 (L) 3.5 - 5.0 g/dL   AST 39 15 - 41 U/L   ALT 21 14 - 54 U/L   Alkaline Phosphatase 82 38 - 126 U/L   Total Bilirubin 0.4 0.3 - 1.2 mg/dL   GFR calc non Af Amer 10 (L) >60 mL/min   GFR calc Af Amer 12 (L) >60  mL/min    Comment: (NOTE) The eGFR has been calculated using the CKD EPI equation. This calculation has not been validated in all clinical situations. eGFR's persistently <60 mL/min signify possible Chronic Kidney Disease.    Anion gap 14 5 - 15  Blood culture (routine x 2)     Status: None   Collection Time: 02/14/16  8:06 PM  Result Value Ref Range   Specimen Description BLOOD LEFT HAND    Special Requests BOTTLES DRAWN AEROBIC AND ANAEROBIC [facility]    Culture NO GROWTH 5 DAYS    Report Status 02/19/2016 FINAL   Blood culture (routine x 2)     Status: None   Collection Time: 02/14/16  8:06 PM  Result Value Ref Range   Specimen Description BLOOD LEFT WRIST    Special Requests BOTTLES DRAWN AEROBIC AND ANAEROBIC [place]    Culture NO GROWTH 5 DAYS    Report Status 02/19/2016 FINAL   Lactic acid,  plasma     Status: None   Collection Time: 02/14/16  8:06 PM  Result Value Ref Range   Lactic Acid, Venous 1.4 0.5 - 1.9 mmol/L  APTT     Status: Abnormal   Collection Time: 02/14/16 10:01 PM  Result Value Ref Range   aPTT 51 (H) 24 - 36 seconds    Comment:        IF BASELINE aPTT IS ELEVATED, SUGGEST PATIENT RISK ASSESSMENT BE USED TO DETERMINE APPROPRIATE ANTICOAGULANT THERAPY.   Protime-INR     Status: Abnormal   Collection Time: 02/14/16 10:01 PM  Result Value Ref Range   Prothrombin Time 27.9 (H) 11.4 - 15.2 seconds   INR 2.55   Glucose, capillary     Status: None   Collection Time: 02/14/16 10:23 PM  Result Value Ref Range   Glucose-Capillary 89 65 - 99 mg/dL  CBC     Status: Abnormal   Collection Time: 02/15/16  4:20 AM  Result Value Ref Range   WBC 8.0 3.6 - 11.0 K/uL   RBC 3.11 (L) 3.80 - 5.20 MIL/uL   Hemoglobin 9.4 (L) 12.0 - 16.0 g/dL   HCT 28.2 (L) 35.0 - 47.0 %   MCV 90.6 80.0 - 100.0 fL   MCH 30.2 26.0 - 34.0 pg   MCHC 33.4 32.0 - 36.0 g/dL   RDW 19.5 (H) 11.5 - 14.5 %   Platelets 243 150 - 440 K/uL  Basic metabolic panel     Status: Abnormal   Collection Time: 02/15/16  4:20 AM  Result Value Ref Range   Sodium 138 135 - 145 mmol/L   Potassium 4.1 3.5 - 5.1 mmol/L   Chloride 102 101 - 111 mmol/L   CO2 24 22 - 32 mmol/L   Glucose, Bld 61 (L) 65 - 99 mg/dL   BUN 55 (H) 6 - 20 mg/dL   Creatinine, Ser 4.79 (H) 0.44 - 1.00 mg/dL   Calcium 7.7 (L) 8.9 - 10.3 mg/dL   GFR calc non Af Amer 8 (L) >60 mL/min   GFR calc Af Amer 9 (L) >60 mL/min    Comment: (NOTE) The eGFR has been calculated using the CKD EPI equation. This calculation has not been validated in all clinical situations. eGFR's persistently <60 mL/min signify possible Chronic Kidney Disease.    Anion gap 12 5 - 15  Protime-INR     Status: Abnormal   Collection Time: 02/15/16  4:20 AM  Result Value Ref Range   Prothrombin Time 26.0 (H) 11.4 -  15.2 seconds   INR 2.33   Glucose, capillary      Status: Abnormal   Collection Time: 02/15/16 11:29 AM  Result Value Ref Range   Glucose-Capillary 153 (H) 65 - 99 mg/dL  Glucose, capillary     Status: Abnormal   Collection Time: 02/15/16  4:48 PM  Result Value Ref Range   Glucose-Capillary 115 (H) 65 - 99 mg/dL   Comment 1 Notify RN   Glucose, capillary     Status: Abnormal   Collection Time: 02/15/16  9:25 PM  Result Value Ref Range   Glucose-Capillary 116 (H) 65 - 99 mg/dL   Comment 1 Notify RN   Protime-INR     Status: Abnormal   Collection Time: 02/16/16  5:00 AM  Result Value Ref Range   Prothrombin Time 20.4 (H) 11.4 - 15.2 seconds   INR 1.72   CBC     Status: Abnormal   Collection Time: 02/16/16  5:00 AM  Result Value Ref Range   WBC 10.0 3.6 - 11.0 K/uL   RBC 3.40 (L) 3.80 - 5.20 MIL/uL   Hemoglobin 10.2 (L) 12.0 - 16.0 g/dL   HCT 30.8 (L) 35.0 - 47.0 %   MCV 90.5 80.0 - 100.0 fL   MCH 30.1 26.0 - 34.0 pg   MCHC 33.2 32.0 - 36.0 g/dL   RDW 19.3 (H) 11.5 - 14.5 %   Platelets 259 150 - 440 K/uL  Basic metabolic panel     Status: Abnormal   Collection Time: 02/16/16  5:00 AM  Result Value Ref Range   Sodium 137 135 - 145 mmol/L   Potassium 4.4 3.5 - 5.1 mmol/L   Chloride 100 (L) 101 - 111 mmol/L   CO2 22 22 - 32 mmol/L   Glucose, Bld 46 (L) 65 - 99 mg/dL   BUN 76 (H) 6 - 20 mg/dL   Creatinine, Ser 5.89 (H) 0.44 - 1.00 mg/dL   Calcium 8.0 (L) 8.9 - 10.3 mg/dL   GFR calc non Af Amer 6 (L) >60 mL/min   GFR calc Af Amer 7 (L) >60 mL/min    Comment: (NOTE) The eGFR has been calculated using the CKD EPI equation. This calculation has not been validated in all clinical situations. eGFR's persistently <60 mL/min signify possible Chronic Kidney Disease.    Anion gap 15 5 - 15  Glucose, capillary     Status: Abnormal   Collection Time: 02/16/16  8:05 AM  Result Value Ref Range   Glucose-Capillary 40 (LL) 65 - 99 mg/dL  Aerobic/Anaerobic Culture (surgical/deep wound)     Status: None   Collection Time: 02/16/16   8:40 AM  Result Value Ref Range   Specimen Description HEEL RIGHT    Special Requests NONE    Gram Stain      ABUNDANT WBC PRESENT, PREDOMINANTLY PMN FEW GRAM POSITIVE COCCI IN PAIRS FEW GRAM NEGATIVE RODS RARE GRAM POSITIVE RODS    Culture      ABUNDANT ENTEROCOCCUS FAECALIS FEW PROTEUS MIRABILIS SUSCEPTIBILITIES PERFORMED ON PREVIOUS CULTURE WITHIN THE LAST 5 DAYS. NO ANAEROBES ISOLATED Performed at [facility]    Report Status 02/21/2016 FINAL   Aerobic/Anaerobic Culture (surgical/deep wound)     Status: None   Collection Time: 02/16/16  8:42 AM  Result Value Ref Range   Specimen Description HEEL RIGHT    Special Requests NONE    Gram Stain      ABUNDANT WBC PRESENT, PREDOMINANTLY PMN ABUNDANT GRAM POSITIVE COCCI IN PAIRS  IN CLUSTERS MODERATE GRAM NEGATIVE RODS FEW GRAM POSITIVE RODS    Culture      ABUNDANT VANCOMYCIN RESISTANT ENTEROCOCCUS FEW PROTEUS MIRABILIS NO ANAEROBES ISOLATED Performed at [facility]    Report Status 02/21/2016 FINAL    Organism ID, Bacteria VANCOMYCIN RESISTANT ENTEROCOCCUS    Organism ID, Bacteria PROTEUS MIRABILIS       Susceptibility   Proteus mirabilis - MIC*    AMPICILLIN >=32 RESISTANT Resistant     CEFAZOLIN <=4 SENSITIVE Sensitive     CEFEPIME <=1 SENSITIVE Sensitive     CEFTAZIDIME <=1 SENSITIVE Sensitive     CEFTRIAXONE <=1 SENSITIVE Sensitive     CIPROFLOXACIN >=4 RESISTANT Resistant     GENTAMICIN <=1 SENSITIVE Sensitive     IMIPENEM 2 SENSITIVE Sensitive     TRIMETH/SULFA >=320 RESISTANT Resistant     AMPICILLIN/SULBACTAM 4 SENSITIVE Sensitive     PIP/TAZO <=4 SENSITIVE Sensitive     * FEW PROTEUS MIRABILIS   Vancomycin resistant enterococcus - MIC*    AMPICILLIN <=2 SENSITIVE Sensitive     VANCOMYCIN >=32 RESISTANT Resistant     GENTAMICIN SYNERGY RESISTANT Resistant     LINEZOLID 2 SENSITIVE Sensitive     * ABUNDANT VANCOMYCIN RESISTANT ENTEROCOCCUS  Glucose, capillary     Status: None   Collection  Time: 02/16/16  9:09 AM  Result Value Ref Range   Glucose-Capillary 96 65 - 99 mg/dL  Glucose, capillary     Status: Abnormal   Collection Time: 02/16/16 11:26 AM  Result Value Ref Range   Glucose-Capillary 62 (L) 65 - 99 mg/dL  Glucose, capillary     Status: Abnormal   Collection Time: 02/16/16  1:19 PM  Result Value Ref Range   Glucose-Capillary 117 (H) 65 - 99 mg/dL  Glucose, capillary     Status: Abnormal   Collection Time: 02/16/16  4:57 PM  Result Value Ref Range   Glucose-Capillary 153 (H) 65 - 99 mg/dL  Glucose, capillary     Status: Abnormal   Collection Time: 02/16/16  9:22 PM  Result Value Ref Range   Glucose-Capillary 206 (H) 65 - 99 mg/dL   Comment 1 Notify RN   Protime-INR     Status: Abnormal   Collection Time: 02/17/16  4:13 AM  Result Value Ref Range   Prothrombin Time 20.6 (H) 11.4 - 15.2 seconds   INR 1.74   Glucose, capillary     Status: Abnormal   Collection Time: 02/17/16  8:45 AM  Result Value Ref Range   Glucose-Capillary 166 (H) 65 - 99 mg/dL  Glucose, capillary     Status: Abnormal   Collection Time: 02/17/16 11:31 AM  Result Value Ref Range   Glucose-Capillary 136 (H) 65 - 99 mg/dL   Comment 1 Notify RN   Glucose, capillary     Status: Abnormal   Collection Time: 02/17/16  8:40 PM  Result Value Ref Range   Glucose-Capillary 118 (H) 65 - 99 mg/dL  Protime-INR     Status: Abnormal   Collection Time: 02/18/16  5:04 AM  Result Value Ref Range   Prothrombin Time 20.2 (H) 11.4 - 15.2 seconds   INR 1.70   CBC     Status: Abnormal   Collection Time: 02/18/16  5:04 AM  Result Value Ref Range   WBC 8.5 3.6 - 11.0 K/uL   RBC 3.19 (L) 3.80 - 5.20 MIL/uL   Hemoglobin 9.6 (L) 12.0 - 16.0 g/dL   HCT 28.5 (L) 35.0 - 47.0 %  MCV 89.2 80.0 - 100.0 fL   MCH 30.2 26.0 - 34.0 pg   MCHC 33.8 32.0 - 36.0 g/dL   RDW 18.9 (H) 11.5 - 14.5 %   Platelets 217 150 - 440 K/uL  Glucose, capillary     Status: None   Collection Time: 02/18/16  7:27 AM  Result Value  Ref Range   Glucose-Capillary 79 65 - 99 mg/dL    Radiology Ct Head Wo Contrast  Result Date: 02/05/2016 CLINICAL DATA:  82 year old female with fall.  Patient on Coumadin. EXAM: CT HEAD WITHOUT CONTRAST TECHNIQUE: Contiguous axial images were obtained from the base of the skull through the vertex without intravenous contrast. COMPARISON:  Head CT dated 06/22/2015 FINDINGS: There is mild age-related atrophy chronic microvascular ischemic changes. Right parietal convexity, left occipital, and left parietal convexity old infarct and encephalomalacia noted. There is no acute intracranial hemorrhage. No mass effect or midline shift noted. No extra-axial fluid collections. Right sphenoid sinus retention cyst or polyp. The remainder of the visualized paranasal sinuses and mastoid air cells are clear. The calvarium is intact. Bifrontal hyperostosis frontalis. IMPRESSION: No acute intracranial hemorrhage. Age-related atrophy and chronic microvascular ischemic changes. 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 Electronically Signed   By: Anner Crete M.D.   On: 02/05/2016 18:11   Ct Ankle Right W Contrast  Result Date: 02/13/2016 CLINICAL DATA:  Nonhealing ulcer.  Location not stated. EXAM: CT OF THE RIGHT ANKLE WITH CONTRAST TECHNIQUE: Multidetector CT imaging of the right ankle was performed following the standard protocol during bolus administration of intravenous contrast. CONTRAST:  156m ISOVUE-300 IOPAMIDOL (ISOVUE-300) INJECTION 61% COMPARISON:  Radiographs 05/22/2015 FINDINGS: Marked abnormal soft tissue thickening along the entire posterior aspect of the ankle and hindfoot with destruction of the posterior aspect of the calcaneus consistent with osteomyelitis. The Achilles tendon is likely destroyed by infection also. Soft tissue ulceration noted posteriorly at the level of the superior aspect of the calcaneus. Suspect a small soft tissue abscess measuring 20 x 8.5 mm on series 8, image 112. Diffuse  cellulitis. Severe midfoot degenerative changes and partial auto fusion. Findings consistent with a neuropathic foot. Moderate tibiotalar degenerative changes also but no definite findings for septic arthritis or osteomyelitis involving the ankle. Extensive small vessel calcifications. IMPRESSION: 1. Diffuse enhancing thickened soft tissues along the entire posterior aspect of the hindfoot and ankle with an open ulcer. Suspect a small abscess measuring 20 x 8.5 mm adjacent to the upper calcaneus. 2. 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 3. Likely destroyed Achilles tendon. 4. Neuropathic changes involving the midfoot. No definite findings for septic arthritis. Electronically Signed   By: PMarijo SanesM.D.   On: 02/13/2016 10:03      Assessment/Plan  End stage renal disease (HCC) Stable. Access has been working well.  Pressure ulcer Had debridement about 2 weeks ago at the hospital. Wound is looking much better. Her 2 abdominal wounds are also healing well. At this point, continue local wound care but I do not think she needs the VAC anymore. I will plan to recheck these in about a month.  [place] important in reducing the progression of atherosclerotic disease. Also, involved in wound healing. On appropriate medications.   Carotid stenosis Stable. No recent symptoms    JLeotis Pain MD  03/03/2016 4:29 PM    This note was created with Dragon medical transcription system.  Any errors from dictation are purely unintentional

## 2016-03-03 NOTE — Assessment & Plan Note (Signed)
Stable. Access has been working well.

## 2016-03-03 NOTE — Assessment & Plan Note (Signed)
Stable. No recent symptoms

## 2016-03-05 ENCOUNTER — Telehealth (INDEPENDENT_AMBULATORY_CARE_PROVIDER_SITE_OTHER): Payer: Self-pay | Admitting: Vascular Surgery

## 2016-03-06 NOTE — Telephone Encounter (Signed)
Dr. Wyn Quakerew see previous note, about patient weight bearing.

## 2016-03-06 NOTE — Progress Notes (Addendum)
[name] ([id number]) Visit Report for 03/03/2016 Arrival Information Details Patient Name: [name], [name]. Date of Service: 03/03/2016 2:15 PM Medical Record Number: [id number] Patient Account Number: [id number] Date of Birth/Sex: [birth year] ([name]) Treating RN: Huel Coventry Primary Care Physician: [name] Other Clinician: Referring Physician: [name] Treating Physician/Extender: Linwood Dibbles, HOYT Weeks in Treatment: 11 Visit Information History Since Last Visit Added or deleted any medications: No Patient Arrived: Wheel Chair Any new allergies or adverse reactions: No Arrival Time: 14:29 Had a fall or experienced change in No Accompanied By: dtr activities of daily living that may affect Transfer Assistance: None risk of falls: Patient Identification Verified: Yes Signs or symptoms of abuse/neglect since last No Secondary Verification Process Yes visito Completed: Hospitalized since last visit: No Patient Requires Transmission- No Has Dressing in Place as Prescribed: Yes Based Precautions: Pain Present Now: No Patient Has Alerts: Yes Patient Alerts: Patient on Blood Thinner DMII Warfarin ABI [place] Bilateral NO BP RIGHT ARM Electronic Signature(s) Signed: 03/10/2016 1:46:02 PM By: Elliot Gurney, RN, BSN, Kim RN, BSN Previous Signature: 03/05/2016 4:28:52 PM Version By: Elliot Gurney, RN, BSN, Kim RN, BSN Entered By: Elliot Gurney, RN, BSN, Kim on 03/10/2016 13:30:40 [name] ([id number]) -------------------------------------------------------------------------------- Encounter Discharge Information Details Patient Name: [name]. Date of Service: 03/03/2016 2:15 PM Medical Record Number: [id number] Patient Account Number: [id number] Date of Birth/Sex: 04-Dec-1933 ([name]) Treating RN: Huel Coventry Primary Care Physician: [name] Other Clinician: Referring Physician: [name] Treating Physician/Extender: Linwood Dibbles, HOYT Weeks in  Treatment: 45 Encounter Discharge Information Items Discharge Pain Level: 0 Discharge Condition: Stable Ambulatory Status: Wheelchair Discharge Destination: Home Transportation: Private Auto Accompanied By: dtr Schedule Follow-up Appointment: Yes Medication Reconciliation completed and provided to Patient/Care Yes Kainat Pizana: Provided on Clinical Summary of Care: 03/03/2016 Form Type Recipient Paper Patient [facility] Electronic Signature(s) Signed: 03/10/2016 1:46:02 PM By: Elliot Gurney, RN, BSN, Kim RN, BSN Previous Signature: 03/05/2016 4:28:52 PM Version By: Elliot Gurney RN, BSN, Kim RN, BSN Previous Signature: 03/03/2016 3:27:43 PM Version By: Gwenlyn Perking Entered By: Elliot Gurney RN, BSN, Kim on 03/10/2016 13:38:06 [name] ([id number]) -------------------------------------------------------------------------------- Lower Extremity Assessment Details Patient Name: [name]. Date of Service: 03/03/2016 2:15 PM Medical Record Number: [id number] Patient Account Number: [id number] Date of Birth/Sex: December 05, 1933 ([name]) Treating RN: Huel Coventry Primary Care Physician: [name] Other Clinician: Referring Physician: [name] Treating Physician/Extender: Linwood Dibbles, HOYT Weeks in Treatment: 11 Vascular Assessment Pulses: Posterior Tibial Dorsalis Pedis Palpable: [Left:Yes] [Right:No] Doppler: [Right:Monophasic] Extremity colors, hair growth, and conditions: Extremity Color: [Left:Hyperpigmented] [Right:Hyperpigmented] Hair Growth on Extremity: [Left:Yes] [Right:Yes] Temperature of Extremity: [Left:Warm] [Right:Warm] Capillary Refill: [Left:< 3 seconds] [Right:< 3 seconds] Dependent Rubor: [Left:No] [Right:No] Blanched when Elevated: [Left:No] [Right:No] Lipodermatosclerosis: [Left:No] [Right:No] Toe Nail Assessment Left: Right: Thick: No No Discolored: No No Deformed: No No Improper Length and Hygiene: No No Electronic Signature(s) Signed: 03/10/2016 1:46:02 PM  By: Elliot Gurney, RN, BSN, Kim RN, BSN Previous Signature: 03/05/2016 4:28:52 PM Version By: Elliot Gurney, RN, BSN, Kim RN, BSN Entered By: Elliot Gurney, RN, BSN, Kim on 03/10/2016 13:31:08 [name] ([id number]) -------------------------------------------------------------------------------- Multi Wound Chart Details Patient Name: [name]. Date of Service: 03/03/2016 2:15 PM Medical Record Number: [id number] Patient Account Number: [id number] Date of Birth/Sex: [birth year] (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 Secondary Etiology: N/A N/A N/A Comorbid History: Arrhythmia, Congestive Arrhythmia, Congestive Arrhythmia, Congestive Heart Failure, Heart Failure, Heart Failure, Hypertension, Type II Hypertension, Type II Hypertension, Type II Diabetes Diabetes Diabetes Date Acquired: 06/02/2015 08/12/2015 06/10/2015 Weeks of Treatment: 11 11 11  Wound Status: Open Open Healed - Epithelialized Pending Amputation on Yes No No Presentation: Measurements L x W x D 0x0x0 0.4x0.4x0.1 0x0x0 (cm) Area (cm) : 0 0.126 0 Volume (cm) : 0 0.013 0 % Reduction in Area: 100.00% 99.70% 100.00% % Reduction in Volume: 100.00% 100.00% 100.00% Tunneling: N/A N/A No Classification: Grade 1 Full Thickness Without Full Thickness Without Exposed Support Exposed Support Structures Structures KIMBLEY, SPRAGUE ([id number]) Exudate Amount:  Medium Large Large Exudate Type: Serous Serous Serous Exudate Color: amber amber amber Foul Odor After Yes Yes Yes Cleansing: Odor Anticipated Due to No No No Product Use: Wound Margin: Flat and Intact Flat and Intact Flat and Intact Granulation Amount: Large (67-100%) Large (67-100%) Large (67-100%) Granulation Quality: Pink Red Red Necrotic Amount: None Present (0%) None Present (0%) None Present (0%) Necrotic Tissue: N/A N/A N/A Exposed Structures: Fascia: No Fascia: No Fascia: No Fat: No Fat: No Fat: No Tendon: No Tendon: No Tendon: No Muscle: No Muscle: No Muscle: No Joint: No Joint: No Joint: No Bone: No Bone: No Bone: No Limited to Skin Limited to Skin Limited to Skin Breakdown Breakdown Breakdown Epithelialization: None None None Periwound Skin Texture: Edema: No Edema: No Edema: No Excoriation: No Excoriation: No Excoriation: No Induration: No Induration: No Induration: No Callus: No Callus: No Callus: No Crepitus: No Crepitus: No Crepitus: No Fluctuance: No Fluctuance: No Fluctuance: No Friable: No Friable: No Friable: No Rash: No Rash: No Rash: No Scarring: No Scarring: No Scarring: No Periwound Skin Dry/Scaly: Yes Moist: Yes Moist: Yes Moisture: Maceration: No Maceration: No Maceration: No Moist: No Dry/Scaly: No Dry/Scaly: No Periwound Skin Color: Erythema: Yes Erythema: Yes Erythema: Yes Atrophie Blanche: No Atrophie Blanche: No Atrophie Blanche: No Cyanosis: No Cyanosis: No Cyanosis: No Ecchymosis: No Ecchymosis: No Ecchymosis: No Hemosiderin Staining: No Hemosiderin Staining: No Hemosiderin Staining: No Mottled: No Mottled: No Mottled: No Pallor: No Pallor: No Pallor: No Rubor: No Rubor: No Rubor: No Erythema Location: Circumferential Circumferential Circumferential Temperature: No Abnormality No Abnormality No Abnormality Tenderness on Yes Yes Yes Palpation: Wound Preparation: Ulcer Cleansing: Ulcer  Cleansing: Ulcer Cleansing: Rinsed/Irrigated with Rinsed/Irrigated with Rinsed/Irrigated with Saline Saline Saline Nabers, Vinetta [place] ([id number]) Topical Anesthetic Topical Anesthetic Applied: None Applied: None Wound Number: [id number]  Photos: Wound Location: Left Calcaneus Right Amputation Site - Left Forearm Digit Wounding Event: Gradually Appeared Surgical Injury Trauma 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 Trauma, Other the Lower Extremity Secondary Etiology: N/A N/A N/A Comorbid History: N/A Arrhythmia, Congestive N/A Heart Failure, Hypertension, Type II Diabetes Date Acquired: 12/31/2015 11/11/2015 02/05/2016 Weeks of Treatment: 9 9 3  Wound Status: Open Open Healed - Epithelialized Pending Amputation on No No No Presentation: Measurements L x W x D 0.7x0.7x0.1 0.6x0.5x0.1 0x0x0 (cm) Area (cm) : 0.385 0.236 0 Volume (cm) : 0.038 0.024 0 % Reduction in Area: -48.60% 68.70% 100.00% % Reduction in Volume: 51.30% 68.00% 100.00% Tunneling: N/A N/A N/A Classification: Grade 1 Full Thickness Without Partial Thickness Exposed Support Structures Exudate Amount: N/A Medium N/A Exudate Type:  N/A Serous N/A Exudate Color: N/A amber N/A Foul Odor After N/A No N/A Cleansing: Odor Anticipated Due to N/A N/A N/A Product Use: Wound Margin: N/A Flat and Intact N/A Granulation Amount: N/A None Present (0%) N/A Granulation Quality: N/A N/A N/A Necrotic Amount: N/A Large (67-100%) N/A Necrotic Tissue: N/A Eschar, Adherent Slough N/A Exposed Structures: N/A Fascia: No N/A Fat: No Tendon: No KENYIA, WAMBOLT ([id number]) Muscle: No Joint: No Bone: No Limited to Skin Breakdown Epithelialization: N/A None N/A Periwound Skin Texture: No Abnormalities Noted Edema: No No Abnormalities Noted Excoriation: No Induration: No Callus: No Crepitus: No Fluctuance: No Friable: No Rash: No Scarring: No Periwound Skin No Abnormalities Noted Maceration: Yes No  Abnormalities Noted Moisture: Moist: Yes Dry/Scaly: No Periwound Skin Color: No Abnormalities Noted Atrophie Blanche: No No Abnormalities Noted Cyanosis: No Ecchymosis: No Erythema: No Hemosiderin Staining: No Mottled: No Pallor: No Rubor: No Erythema Location: N/A N/A N/A Temperature: N/A No Abnormality N/A Tenderness on No No No Palpation: Wound Preparation: N/A Ulcer Cleansing: N/A Rinsed/Irrigated with Saline Topical Anesthetic Applied: None, Other: lidocaine 4% Wound Number: 9 N/A N/A Photos: N/A N/A Wound Location: Right Achilles N/A N/A Wounding Event: Gradually Appeared N/A N/A 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 N/A N/A Comorbid History: N/A N/A JEANIE, MCCARD ([id number]) Arrhythmia, Congestive Heart Failure, Hypertension, Type II Diabetes Date Acquired: 06/02/2015 N/A N/A Weeks of Treatment: 11 N/A N/A Wound Status: Open N/A N/A Pending Amputation on Yes N/A N/A Presentation: Measurements L x W x D 10x3.4x0.2 N/A N/A (cm) Area (cm) : 26.704 N/A N/A Volume (cm) : 5.341 N/A N/A % Reduction in Area: 18.10% N/A N/A % Reduction in Volume: 45.40% N/A N/A Position 1 (o'clock): 12 Maximum Distance 1 1.8 (cm): Tunneling: Yes N/A N/A Classification: Grade 2 N/A N/A Exudate Amount: Large N/A N/A Exudate Type: Serosanguineous N/A N/A Exudate Color: red, brown N/A N/A Foul Odor After Yes N/A N/A Cleansing: Odor Anticipated Due to No N/A N/A Product Use: Wound Margin: Thickened N/A N/A Granulation Amount: Large (67-100%) N/A N/A Granulation Quality: Red N/A N/A Necrotic Amount: Small (1-33%) N/A N/A Necrotic Tissue: Eschar, Adherent Slough N/A N/A Exposed Structures: Tendon: Yes N/A N/A Fascia: No Fat: No Muscle: No Joint: No Bone: No Epithelialization: None N/A N/A Periwound Skin Texture: Edema: No N/A N/A Excoriation: No Induration: No Callus: No Crepitus:  No Fluctuance: No Friable: No Rash: No Scarring: No N/A N/A DARIANE, NATZKE ([id number]) Periwound Skin Maceration: Yes Moisture: Moist: Yes Dry/Scaly: No Periwound Skin Color: Erythema: Yes N/A N/A Atrophie Blanche: No Cyanosis: No Ecchymosis: No Hemosiderin Staining: No Mottled: No Pallor: No Rubor: No Erythema Location: Circumferential N/A N/A Temperature: N/A N/A N/A Tenderness on Yes N/A N/A Palpation: Wound Preparation: Ulcer Cleansing: N/A N/A Rinsed/Irrigated with Saline Topical Anesthetic Applied: Other: lidocaine 4% Treatment Notes Electronic Signature(s) Signed: 03/05/2016 4:28:52 PM By: Elliot Gurney, RN, BSN, Kim RN, BSN Entered By: Elliot Gurney, RN, BSN, Kim on 03/03/2016 15:19:04 [name] ([id number]) -------------------------------------------------------------------------------- Multi-Disciplinary Care Plan Details Patient Name: CHIARA, COLTRIN. Date of Service: 03/03/2016 2:15 PM Medical Record Number: [id number] Patient Account Number: [id number] Date of Birth/Sex: 26-May-1934 ([name]) Treating RN: Huel Coventry Primary Care Physician: [name] Other Clinician: Referring Physician: [name] Treating Physician/Extender: Linwood Dibbles, HOYT Weeks in Treatment: 21 Active Inactive Abuse / Safety / Falls / Self Care Management Nursing Diagnoses: Impaired physical mobility Potential for falls Goals: Patient will remain injury free Date Initiated: 12/17/2015  Goal Status: Active Interventions: Assess fall risk on admission and as needed Notes: Necrotic Tissue Nursing Diagnoses: Impaired tissue integrity related to necrotic/devitalized tissue Knowledge deficit related to management of necrotic/devitalized tissue Goals: Necrotic/devitalized tissue will be minimized in the wound bed Date Initiated: 02/04/2016 Goal Status: Active Interventions: Assess patient pain level pre-, during and post procedure and prior to discharge Provide  education on necrotic tissue and debridement process Treatment Activities: Apply topical anesthetic as ordered : 02/04/2016 Notes: Wound/Skin Impairment LIZET, KELSO ([id number]) Nursing Diagnoses: Impaired tissue integrity Goals: Patient/caregiver will verbalize understanding of skin care regimen Date Initiated: 12/17/2015 Goal Status: Active Ulcer/skin breakdown will have a volume reduction of 30% by week 4 Date Initiated: 12/17/2015 Goal Status: Active Ulcer/skin breakdown will have a volume reduction of 50% by week 8 Date Initiated: 12/17/2015 Goal Status: Active Ulcer/skin breakdown will have a volume reduction of 80% by week 12 Date Initiated: 12/17/2015 Goal Status: Active Ulcer/skin breakdown will heal within 14 weeks Date Initiated: 12/17/2015 Goal Status: Active Interventions: Assess patient/caregiver ability to obtain necessary supplies Assess patient/caregiver ability to perform ulcer/skin care regimen upon admission and as needed Assess ulceration(s) every visit Notes: Electronic Signature(s) Signed: 03/10/2016 1:46:02 PM By: Elliot Gurney, RN, BSN, Kim RN, BSN Previous Signature: 03/05/2016 4:28:52 PM Version By: Elliot Gurney, RN, BSN, Kim RN, BSN Entered By: Elliot Gurney, RN, BSN, Kim on 03/10/2016 13:31:19 [name] ([id number]) -------------------------------------------------------------------------------- Pain Assessment Details Patient Name: BEATRIX, BREECE. Date of Service: 03/03/2016 2:15 PM Medical Record Number: [id number] Patient Account Number: [id number] Date of Birth/Sex: [birth year] ([name]) Treating RN: Huel Coventry Primary Care Physician: [name] Other Clinician: Referring Physician: [name] Treating Physician/Extender: Linwood Dibbles, HOYT Weeks in Treatment: 11 Active Problems Location of Pain Severity and Description of Pain Patient Has Paino No Site Locations With Dressing Change: No Pain Management and Medication Current Pain  Management: Electronic Signature(s) Signed: 03/10/2016 1:46:02 PM By: Elliot Gurney, RN, BSN, Kim RN, BSN Previous Signature: 03/05/2016 4:28:52 PM Version By: Elliot Gurney, RN, BSN, Kim RN, BSN Entered By: Elliot Gurney, RN, BSN, Kim on 03/10/2016 13:30:49 [name] ([id number]) -------------------------------------------------------------------------------- Patient/Caregiver Education Details Patient Name: [name]. Date of Service: 03/03/2016 2:15 PM Medical Record Number: [id number] Patient Account Number: [id number] Date of Birth/Gender: 07/25/1933 ([name]) Treating RN: Huel Coventry Primary Care Physician: [name] Other Clinician: Referring Physician: [name] Treating Physician/Extender: Skeet Simmer in Treatment: 11 Education Assessment Education Provided To: Patient Education Topics Provided Wound/Skin Impairment: Handouts: Caring for Your Ulcer Methods: Explain/Verbal Responses: State content correctly Electronic Signature(s) Signed: 03/05/2016 4:28:52 PM By: Elliot Gurney, RN, BSN, Kim RN, BSN Entered By: Elliot Gurney, RN, BSN, Kim on 03/03/2016 16:08:32 [name] ([id number]) -------------------------------------------------------------------------------- Wound Assessment Details Patient Name: [name]. Date of Service: 03/03/2016 2:15 PM Medical Record Patient Account Number: [id number] Number: Treating RN: Huel Coventry [birth year] (82 y.o. Other Clinician: Date of Birth/Sex: Female) Treating ROBSON, MICHAEL Primary Care Physician: [name] Physician/Extender: G Referring Physician: Charolotte Capuchin in Treatment: 11 Wound Status Wound Number: 10 Primary Diabetic Wound/Ulcer of the Lower Etiology: Extremity Wound Location: Right Calcaneus Wound Open Wounding Event: Gradually Appeared Status: Date Acquired: 06/02/2015 Comorbid Arrhythmia, Congestive Heart Failure, Weeks Of Treatment: 11 History: Hypertension, Type II  Diabetes Clustered Wound: No Pending Amputation On Presentation Photos Wound Measurements Length: (cm) 0 % Reduction in Width: (cm) 0 % Reduction in Depth: (cm) 0 Epithelializati Area: (cm) 0 Volume: (cm) 0 Area: 100% Volume: 100% on: None Wound Description Classification: Grade  1 Wound Margin: Flat and Intact Exudate Amount: Medium Exudate Type: Serous Exudate Color: amber Foul Odor After Cleansing: Yes Due to Product Use: No Wound Bed Granulation Amount: Large (67-100%) Exposed Structure Granulation Quality: Pink Fascia Exposed: No Necrotic Amount: None Present (0%) Fat Layer Exposed: No Tendon Exposed: No Muscle Exposed: No NGOZI, ALVIDREZ ([id number]) Joint Exposed: No Bone Exposed: No Limited to Skin Breakdown Periwound Skin Texture Texture Color No Abnormalities Noted: No No Abnormalities Noted: No Callus: No Atrophie Blanche: No Crepitus: No Cyanosis: No Excoriation: No Ecchymosis: No Fluctuance: No Erythema: Yes Friable: No Erythema Location: Circumferential Induration: No Hemosiderin Staining: No Localized Edema: No Mottled: No Rash: No Pallor: No Scarring: No Rubor: No Moisture Temperature / Pain No Abnormalities Noted: No Temperature: No Abnormality Dry / Scaly: Yes Tenderness on Palpation: Yes Maceration: No Moist: No Wound Preparation Ulcer Cleansing: Rinsed/Irrigated with Saline Topical Anesthetic Applied: None Electronic Signature(s) Signed: 03/05/2016 4:28:52 PM By: Elliot Gurney, RN, BSN, Kim RN, BSN Entered By: Elliot Gurney, RN, BSN, Kim on 03/03/2016 14:49:03 [name] ([id number]) -------------------------------------------------------------------------------- Wound Assessment Details Patient Name: [name]. Date of Service: 03/03/2016 2:15 PM Medical Record Patient Account Number: [id number] Number: Treating RN: Huel Coventry [birth year] (82 y.o. Other Clinician: Date of Birth/Sex: Female) Treating ROBSON,  MICHAEL Primary Care Physician: [name] Physician/Extender: G Referring Physician: Charolotte Capuchin in Treatment: 11 Wound Status Wound Number: 11 Primary Calciphylaxis Etiology: Wound Location: Abdomen - Lower Quadrant - Midline Wound Open Status: Wounding Event: Trauma Comorbid Arrhythmia, Congestive Heart Failure, Date Acquired: 08/12/2015 History: Hypertension, Type II Diabetes Weeks Of Treatment: 11 Clustered Wound: No Photos Photo Uploaded By: Elliot Gurney, RN, BSN, Kim on 03/03/2016 14:54:18 Wound Measurements Length: (cm) 0.4 % Reduction in A Width: (cm) 0.4 % Reduction in V Depth: (cm) 0.1 Epithelializatio Area: (cm) 0.126 Volume: (cm) 0.013 rea: 99.7% olume: 100% n: None Wound Description Full Thickness Without Exposed Foul Odor After Classification: Support Structures Due to Product Wound Margin: Flat and Intact Exudate Large Amount: Exudate Type: Serous Exudate Color: amber Cleansing: Yes Use: No Wound Bed Granulation Amount: Large (67-100%) Exposed Structure Granulation Quality: Red Fascia Exposed: No XITLALY, AULT ([id number]) Necrotic Amount: None Present (0%) Fat Layer Exposed: No Tendon Exposed: No Muscle Exposed: No Joint Exposed: No Bone Exposed: No Limited to Skin Breakdown Periwound Skin Texture Texture Color No Abnormalities Noted: No No Abnormalities Noted: No Callus: No Atrophie Blanche: No Crepitus: No Cyanosis: No Excoriation: No Ecchymosis: No Fluctuance: No Erythema: Yes Friable: No Erythema Location: Circumferential Induration: No Hemosiderin Staining: No Localized Edema: No Mottled: No Rash: No Pallor: No Scarring: No Rubor: No Moisture Temperature / Pain No Abnormalities Noted: No Temperature: No Abnormality Dry / Scaly: No Tenderness on Palpation: Yes Maceration: No Moist: Yes Wound Preparation Ulcer Cleansing: Rinsed/Irrigated with Saline Treatment Notes Wound #11 (Midline Abdomen - Lower  Quadrant) 1. Cleansed with: Clean wound with Normal Saline 2. Anesthetic Topical Lidocaine 4% cream to wound bed prior to debridement 4. Dressing Applied: Prisma Ag 5. Secondary Dressing Applied Bordered Foam Dressing Electronic Signature(s) Signed: 03/05/2016 4:28:52 PM By: Elliot Gurney, RN, BSN, Kim RN, BSN Entered By: Elliot Gurney, RN, BSN, Kim on 03/03/2016 14:49:34 [name] ([id number]) -------------------------------------------------------------------------------- Wound Assessment Details Patient Name: TANYLAH, SCHNOEBELEN. Date of Service: 03/03/2016 2:15 PM Medical Record Patient Account Number: [id number] Number: Treating RN: Huel Coventry 12-22-1933 (82 y.o. Other Clinician: Date of Birth/Sex: Female) Treating ROBSON, MICHAEL Primary Care Physician: [name] Physician/Extender: G Referring Physician: Charolotte Capuchin in  Treatment: 11 Wound Status Wound Number: 12 Primary Calciphylaxis Etiology: Wound Location: Left Abdomen - Lower Quadrant Wound Healed - Epithelialized Status: Wounding Event: Trauma Comorbid Arrhythmia, Congestive Heart Failure, Date Acquired: 06/10/2015 History: Hypertension, Type II Diabetes Weeks Of Treatment: 11 Clustered Wound: No Photos Wound Measurements Length: (cm) 0 % Reduction in Width: (cm) 0 % Reduction in Depth: (cm) 0 Epithelializati Area: (cm) 0 Tunneling: Volume: (cm) 0 Undermining: Area: 100% Volume: 100% on: None No No Wound Description Full Thickness Without Exposed Classification: Support Structures Wound Margin: Flat and Intact Exudate Large Amount: Exudate Type: Serous Exudate Color: amber Foul Odor After Cleansing: Yes Due to Product Use: No Wound Bed Granulation Amount: Large (67-100%) Exposed Structure Granulation Quality: Red Fascia Exposed: No Necrotic Amount: None Present (0%) Fat Layer Exposed: No Natalie RimaHOPKINS, Assia Y. ([id number]) Tendon Exposed: No Muscle Exposed: No Joint Exposed:  No Bone Exposed: No Limited to Skin Breakdown Periwound Skin Texture Texture Color No Abnormalities Noted: No No Abnormalities Noted: No Callus: No Atrophie Blanche: No Crepitus: No Cyanosis: No Excoriation: No Ecchymosis: No Fluctuance: No Erythema: Yes Friable: No Erythema Location: Circumferential Induration: No Hemosiderin Staining: No Localized Edema: No Mottled: No Rash: No Pallor: No Scarring: No Rubor: No Moisture Temperature / Pain No Abnormalities Noted: No Temperature: No Abnormality Dry / Scaly: No Tenderness on Palpation: Yes Maceration: No Moist: Yes Wound Preparation Ulcer Cleansing: Rinsed/Irrigated with Saline Topical Anesthetic Applied: None Electronic Signature(s) Signed: 03/05/2016 4:28:52 PM By: Elliot GurneyWoody, RN, BSN, Kim RN, BSN Entered By: Elliot GurneyWoody, RN, BSN, Kim on 03/03/2016 14:50:45 Natalie RimaHOPKINS, Wilmary Y. ([id number]) -------------------------------------------------------------------------------- Wound Assessment Details Patient Name: Natalie RimaHOPKINS, Keven Y. Date of Service: 03/03/2016 2:15 PM Medical Record Patient Account Number: [id number] Number: Treating RN: Huel CoventryWoody, Kim [birth year] (82 y.o. Other Clinician: Date of Birth/Sex: Female) Treating ROBSON, MICHAEL Primary Care Physician: Rolm GalaGrandis, Heidi Physician/Extender: G Referring Physician: Charolotte CapuchinGrandis, Heidi Weeks in Treatment: 11 Wound Status Wound Number: 13 Primary Diabetic Wound/Ulcer of the Lower Etiology: Extremity Wound Location: Left Calcaneus Wound Status: Open Wounding Event: Gradually Appeared Date Acquired: 12/31/2015 Weeks Of Treatment: 9 Clustered Wound: No Photos Photo Uploaded By: Elliot GurneyWoody, RN, BSN, Kim on 03/03/2016 14:54:18 Wound Measurements Length: (cm) 0.7 Width: (cm) 0.7 Depth: (cm) 0.1 Area: (cm) 0.385 Volume: (cm) 0.038 % Reduction in Area: -48.6% % Reduction in Volume: 51.3% Wound Description Classification: Grade 1 Periwound Skin Texture Texture Color No  Abnormalities Noted: No No Abnormalities Noted: No Moisture No Abnormalities Noted: No Treatment Notes Wound #13 (Left Calcaneus) 1. Cleansed with: Natalie RimaHOPKINS, Jovan Y. ([id number]) Clean wound with Normal Saline 5. Secondary Dressing Applied Bordered Foam Dressing Electronic Signature(s) Signed: 03/05/2016 4:28:52 PM By: Elliot GurneyWoody, RN, BSN, Kim RN, BSN Entered By: Elliot GurneyWoody, RN, BSN, Kim on 03/03/2016 14:52:58 Natalie RimaHOPKINS, Brittinie Y. ([id number]) -------------------------------------------------------------------------------- Wound Assessment Details Patient Name: Natalie RimaHOPKINS, Xcaret Y. Date of Service: 03/03/2016 2:15 PM Medical Record Patient Account Number: [id number] Number: Treating RN: Huel CoventryWoody, Kim [birth year] (82 y.o. Other Clinician: Date of Birth/Sex: Female) Treating ROBSON, MICHAEL Primary Care Physician: Rolm GalaGrandis, Heidi Physician/Extender: G Referring Physician: Charolotte CapuchinGrandis, Heidi Weeks in Treatment: 11 Wound Status Wound Number: 14 Primary Open Surgical Wound Etiology: Wound Location: Right Amputation Site - Digit Wound Open Wounding Event: Surgical Injury Status: Date Acquired: 11/11/2015 Comorbid Arrhythmia, Congestive Heart Failure, Weeks Of Treatment: 9 History: Hypertension, Type II Diabetes Clustered Wound: No Photos Wound Measurements Length: (cm) 0.6 Width: (cm) 0.5 Depth: (cm) 0.1 Area: (cm) 0.236 Volume: (cm) 0.024 % Reduction in Area: 68.7% % Reduction in Volume: 68% Epithelialization: None Wound  Description Full Thickness Without Exposed Classification: Support Structures Wound Margin: Flat and Intact Exudate Medium Amount: Exudate Type: Serous Exudate Color: amber Foul Odor After Cleansing: No Wound Bed Granulation Amount: None Present (0%) Exposed Structure Necrotic Amount: Large (67-100%) Fascia Exposed: No Necrotic Quality: Eschar, Adherent Slough Fat Layer Exposed: No Tendon Exposed: No HUMA, IMHOFF ([id number]) Muscle Exposed:  No Joint Exposed: No Bone Exposed: No Limited to Skin Breakdown Periwound Skin Texture Texture Color No Abnormalities Noted: No No Abnormalities Noted: No Callus: No Atrophie Blanche: No Crepitus: No Cyanosis: No Excoriation: No Ecchymosis: No Fluctuance: No Erythema: No Friable: No Hemosiderin Staining: No Induration: No Mottled: No Localized Edema: No Pallor: No Rash: No Rubor: No Scarring: No Temperature / Pain Moisture Temperature: No Abnormality No Abnormalities Noted: No Dry / Scaly: No Maceration: Yes Moist: Yes Wound Preparation Ulcer Cleansing: Rinsed/Irrigated with Saline Topical Anesthetic Applied: None, Other: lidocaine 4%, Treatment Notes Wound #14 (Right Amputation Site - Digit) 1. Cleansed with: Clean wound with Normal Saline 5. Secondary Dressing Applied Bordered Foam Dressing Electronic Signature(s) Signed: 03/05/2016 4:28:52 PM By: Elliot Gurney, RN, BSN, Kim RN, BSN Entered By: Elliot Gurney, RN, BSN, Kim on 03/03/2016 14:52:16 [name] ([id number]) -------------------------------------------------------------------------------- Wound Assessment Details Patient Name: MIRREN, GEST. Date of Service: 03/03/2016 2:15 PM Medical Record Patient Account Number: [id number] Number: Treating RN: Huel Coventry 01/04/1934 (82 y.o. Other Clinician: Date of Birth/Sex: Female) Treating ROBSON, MICHAEL Primary Care Physician: [name] Physician/Extender: G Referring Physician: Charolotte Capuchin in Treatment: 11 Wound Status Wound Number: 15 Primary Etiology: Trauma, Other Wound Location: Left Forearm Wound Status: Healed - Epithelialized Wounding Event: Trauma Date Acquired: 02/05/2016 Weeks Of Treatment: 3 Clustered Wound: No Photos Photo Uploaded By: Elliot Gurney, RN, BSN, Kim on 03/03/2016 14:54:33 Wound Measurements Length: (cm) 0 % Reduction in Width: (cm) 0 % Reduction in Depth: (cm) 0 Area: (cm) 0 Volume: (cm) 0 Area:  100% Volume: 100% Wound Description Classification: Partial Thickness Periwound Skin Texture Texture Color No Abnormalities Noted: No No Abnormalities Noted: No Moisture No Abnormalities Noted: No Electronic Signature(s) Signed: 03/05/2016 4:28:52 PM By: Elliot Gurney, RN, BSN, Kim RN, BSN Entered By: Elliot Gurney, RN, BSN, Kim on 03/03/2016 14:52:58 ZLATY, ALEXA ([id number]) [name] ([id number]) -------------------------------------------------------------------------------- Wound Assessment Details Patient Name: [name]. Date of Service: 03/03/2016 2:15 PM Medical Record Patient Account Number: [id number] Number: Treating RN: Huel Coventry 1934/04/27 (82 y.o. 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 Wounding Event: Gradually Appeared Etiology: Date Acquired: 06/02/2015 Wound Open Weeks Of Treatment: 11 Status: Clustered Wound: No Comorbid Arrhythmia, Congestive Heart Pending Amputation On Presentation History: Failure, Hypertension, Type II Diabetes Photos Wound Measurements Length: (cm) 10 % Reduction in Area: Width: (cm) 3.4 % Reduction in Volum Depth: (cm) 0.2 Epithelialization: Area: (cm) 26.704 Tunneling: Volume: (cm) 5.341 Position (o'cloc Maximum Distance: 18.1% e: 45.4% None Yes k): 12 (cm) 1.8 Wound Description Classification: Grade 2 Foul Odor After Cle Wound Margin: Thickened Due to Product Use: Exudate Amount: Large Exudate Type: Serosanguineous Exudate Color: red, brown ansing: Yes No Wound Bed Granulation Amount: Large (67-100%) Exposed Structure ARYAA, BUNTING ([id number]) Granulation Quality: Red Fascia  Exposed: No Necrotic Amount: Small (1-33%) Fat Layer Exposed: No Necrotic Quality: Eschar, Adherent Slough Tendon Exposed: Yes Muscle Exposed: No Joint Exposed:  No Bone Exposed: No Periwound Skin Texture Texture Color No Abnormalities Noted: No No Abnormalities Noted: No Callus: No Atrophie Blanche: No Crepitus: No Cyanosis: No Excoriation: No Ecchymosis: No Fluctuance: No Erythema: Yes Friable: No Erythema Location: Circumferential Induration: No Hemosiderin Staining: No Localized Edema: No Mottled: No Rash: No Pallor: No Scarring: No Rubor: No Moisture Temperature / Pain No Abnormalities Noted: No Tenderness on Palpation: Yes Dry / Scaly: No Maceration: Yes Moist: Yes Wound Preparation Ulcer Cleansing: Rinsed/Irrigated with Saline Topical Anesthetic Applied: Other: lidocaine 4%, Treatment Notes Wound #9 (Right Achilles) 1. Cleansed with: Clean wound with Normal Saline 5. Secondary Dressing Applied Gauze and Kerlix/Conform 6. Footwear/Offloading device applied Wedge shoe Notes Proofreader) Signed: 03/05/2016 4:28:52 PM By: Elliot Gurney, RN, BSN, Kim RN, BSN Entered By: Elliot Gurney, RN, BSN, Kim on 03/03/2016 14:48:15 [name] ([id number]) -------------------------------------------------------------------------------- Vitals Details Patient Name: [name] Date of Service: 03/03/2016 2:15 PM Medical Record Number: [id number] Patient Account Number: [id number] Date of Birth/Sex: 1933/07/22 ([name]) Treating RN: Huel Coventry Primary Care Physician: [name] Other Clinician: Referring Physician: [name] Treating Physician/Extender: Linwood Dibbles, HOYT Weeks in Treatment: 11 Vital Signs Time Taken: 14:30 Temperature (F): 97.7 Height (in): 65 Pulse (bpm): 70 Weight (lbs): 160 Respiratory Rate (breaths/min): 16 Body Mass Index (BMI): 26.6 Blood Pressure (mmHg): 101/43 Reference Range: 80 - 120 mg /  dl Electronic Signature(s) Signed: 03/10/2016 1:46:02 PM By: Elliot Gurney, RN, BSN, Kim RN, BSN Previous Signature: 03/05/2016 4:28:52 PM Version By: Elliot Gurney, RN, BSN, Kim RN, BSN Entered By: Elliot Gurney, RN, BSN, Kim on 03/10/2016 13:30:57

## 2016-03-06 NOTE — Telephone Encounter (Signed)
A message was left for the patient regarding Dr. Driscilla Grammesew's recommendations and she was told to call back if any questions. See previous note.

## 2016-03-06 NOTE — Telephone Encounter (Signed)
She can wear the specialty shoe and weight bear with that

## 2016-03-10 ENCOUNTER — Encounter (INDEPENDENT_AMBULATORY_CARE_PROVIDER_SITE_OTHER): Payer: Self-pay

## 2016-03-12 NOTE — Progress Notes (Addendum)
WILFRED, SIVERSON ([id number]) Visit Report for 03/03/2016 Chief Complaint Document Details Patient Name: [name], [name]. Date of Service: 03/03/2016 2:15 PM Medical Record Number: [id number] Patient Account Number: [id number] Date of Birth/Sex: [birth year] (82 [name] Female) Treating RN: Huel Coventry Primary Care Physician: [name] Other Clinician: Referring Physician: [name] Treating Physician/Extender: Linwood Dibbles, HOYT Weeks in Treatment: 11 Information Obtained from: Patient Chief Complaint Patient is seen today for evaluation concerning her ongoing lower abdominal wound midline to left as well as her bilateral heel wounds and her right Achilles wound. Electronic Signature(s) Signed: 03/10/2016 1:46:02 PM By: Elliot Gurney RN, BSN, Kim RN, BSN Signed: 03/11/2016 4:52:13 PM By: Lenda Kelp PA-C Previous Signature: 03/05/2016 1:37:32 AM Version By: Lenda Kelp PA-C Entered By: Elliot Gurney RN, BSN, Kim on 03/10/2016 13:37:13 Fara, Worthy Vinetta Bergamo ([id number]) -------------------------------------------------------------------------------- Debridement Details Patient Name: [name], [name]. Date of Service: 03/03/2016 2:15 PM Medical Record Number: [id number] Patient Account Number: [id number] Date of Birth/Sex: [birth year] (82 [name] Female) Treating RN: Huel Coventry Primary Care Physician: [name] Other Clinician: Referring Physician: [name] Treating Physician/Extender: Linwood Dibbles, HOYT Weeks in Treatment: 11 Debridement Performed for Wound #11 Midline Abdomen - Lower Quadrant Assessment: Performed By: Physician STONE III, HOYT, Debridement: Debridement Pre-procedure Yes - 03:14 Verification/Time Out Taken: Start Time: 03:14 Pain Control: Lidocaine 4% Topical Solution Level: Skin/Subcutaneous Tissue Total Area Debrided (L x 0.4 (cm) x 0.4 (cm) = 0.16 (cm) W): Tissue and other Viable, Non-Viable, Exudate, Fibrin/Slough, Subcutaneous material debrided: Instrument:  Curette Bleeding: Minimum Hemostasis Achieved: Pressure End Time: 03:18 Procedural Pain: 0 Post Procedural Pain: 0 Response to Treatment: Procedure was tolerated well Post Debridement Measurements of Total Wound Length: (cm) 0.4 Width: (cm) 0.5 Depth: (cm) 0.1 Volume: (cm) 0.016 Character of Wound/Ulcer Post Improved Debridement: Severity of Tissue Post Debridement: Fat layer exposed Post Procedure Diagnosis Same as Pre-procedure Electronic Signature(s) Signed: 03/10/2016 1:46:02 PM By: Elliot Gurney, RN, BSN, Kim RN, BSN Signed: 03/11/2016 4:52:13 PM By: Lenda Kelp PA-C Previous Signature: 03/05/2016 1:37:32 AM Version By: Lenda Kelp PA-C Entered By: Elliot Gurney RN, BSN, Kim on 03/10/2016 13:36:50 [name], [name] ([id number]) [name], [name] ([id number]) -------------------------------------------------------------------------------- HPI Details Patient Name: [name], [name]. Date of Service: 03/03/2016 2:15 PM Medical Record Number: [id number] Patient Account Number: [id number] Date of Birth/Sex: May 24, 1934 (82 [name] Female) Treating RN: Huel Coventry Primary Care Physician: [name] Other Clinician: Referring Physician: [name] Treating Physician/Extender: Linwood Dibbles, HOYT Weeks in Treatment: 11 History of Present Illness Location: Currently patient has just remaining a wound on her lower midline abdominal region, right fourth toe amputation site, and right Achilles region. Quality: Patient is not not feeling any discomfort at this point in time and tells me that she is feeling "great" Severity: 1 out of 10 Duration: Patient has been hospitalized recently and then transferred to the skilled nursing facility where she now is receiving care as well as regard to her wounds. Timing: Pain in wound is Intermittent but for the most part stays away at this time Context: The wound appeared gradually over time Modifying Factors: Wound interventions which have been  performed at [facility] skilled nursing facility seem to have been helpful at this point in time. Patient's daughter as well as patient are very encouraged with the help she has received from the facility. Associated Signs and Symptoms: Patient has congestive heart failure, chronic respiratory failure, end- stage renal disease for which she currently is in dialysis, atrial fibrillation, diabetes mellitus HPI Description: 82 year old patient who  is known to be diabetic, was referred to [place] by Dr. Gavin Potters for a right heel ulceration which she's had for a while. She was recently in hospital for a pneumonia and at that time and got delirious and was disoriented and sometime during this time developed a stage II ulcer on her right heel. Her past medical history is significant for bilateral pneumonia which was treated with injectable antibiotics and then to oral Levaquin which he has completed. She also has acute on chronic diastolic CHF, acute on chronic respiratory failure, end-stage renal disease on hemodialysis, atrial fibrillation, recent stroke, diabetes mellitus. The patient and her son are poor historians but from what I understand she was admitted to the hospital with an acute vascular compromise of her right lower extremity and Dr. Wyn Quaker has done a surgical procedure and we are trying to obtain these notes. There are also some vascular workup done and we will try and obtain these notes. the injury to the left lower quadrant of abdomen and the suprapubic area have been there due to a bruise and have been there for several months and no intervention has been done. 10/11/2015 -- on review of the electronics records it was noted that the patient was admitted to the hospital on 09/14/2015 with peripheral vascular disease with claudication, end-stage renal disease, pressure ulcer, chronic atrial fibrillation. She was seen by Dr. Wyn Quaker who did her right lower extremity angiogram , angioplasty of the right  anterior tibial artery and thrombolysis with TPA of the right popliteal artery, and thrombectomy. She was seen by Dr. Wyn Quaker during this past week and he was pleased with the progress. He did say that if he took her to the operating room for any procedure he would debride the abdominal wound under anesthesia. She was also seen by Dr. Ether Griffins the podiatrist who thought that she may lose her right fourth toe at some stage may need an amputation of this. 10/21/2015 --patient known to Dr. Wyn Quaker and his last office visit from 10/04/2015 has been reviewed. 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 She also had a persistent ulceration of the right heel and markedly this area and a right third and fourth toe and a small scab on the calf but these are dry and seemed to be improving. Patient also has a left carotid endarterectomy and multiple interventions to a right brachiocephalic AV fistula. After the visit he had recommended noninvasive studies to recheck her revascularization. He was off the impression that she would likely lose the right fourth toe and the third toe was likely to heal. He was concerned about underlying muscle necrosis on her right heel and midfoot. 11/01/2015 -- an echo done in January of this year showed her left ventricular ejection fraction to be about 50-55%. The patient was seen by the PA and Dr. Driscilla Grammes office and the plan was to take her to the operating room soon to have a debridement under anesthesia for the abdominal wall wound, the Achilles tendon on the right leg and amputation of the right fourth toe. The daughter and the patient do not feel that they would be able to undergo hyperbaric oxygen therapy 5 days a week for 6 weeks. 12/17/15; this is a medically complex woman who I note was recently in this clinic however I was not  involved with her care. She returns today with multiple wounds; a)  she has a wound in the mid abdomen that is been there since March of this year. I note that she is been to the overall for debridement recently. The exact etiology of this wound is not really clear b) left lower quadrant abdominal wound had some sanguinous drainage when she came in here. The patient fell in January and thinks this may have been secondary to a hematoma. c): The patient has 3 wounds on her right leg including a small wound on the right mid calf, a large area over the Achilles which currently has a wound VAC for the last 6 weeks, also a smaller wound on the distal part of the right heel. As far as I understand most of these wounds are currently been dressed with's calcium alginate. According to her daughter the Achilles wound under the wound VAC is doing well d) the patient is had an amputation of her left fifth toe in January and the right fourth toe 6 weeks ago secondary to diabetic PAD e) the patient has chronic renal failure on dialysis for the last 2 years secondary to type 2 diabetes on insulin. The daughter's knowledge there is been no biopsy of the abdominal wounds given their current appearance and lack of undefined etiology at have to wonder about calciphylaxis. 12/18/15:Addendum; I [facility] I can see no relevant x-rays of the right heel. I note her arteriogram and revascularization of her right lower extremity in April 2017. She had debridement of both abdominal wounds and the right heel and Achilles wound on 11/07/15 at which time she had a right fourth toe ray amputation. The abdominal wounds were debridement again on 6/29. I do not see any relevant pathology of these abdominal wounds 12/24/15; culture I did of the drainage from the midline abdominal wound last week showed both Proteus and ampicillin sensitive enterococcus. I've given her a course of Augmentin adjusted on dialysis  days. She has no specific complaints today. Been using Santyl to the abdominal wounds in the right leg wound and the wound VAC on the right Achilles which was initially prescribed by Dr. dew 12/31/15; I have done two punch biopsies of the large midline abdominal. 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 May have been a trauma component of the one on the left lower quadrant however the midline wound had no such history. She has a large area on the right Achilles heel with a wound VAC prescribed by Dr. dew. A small wound on the right anterior leg.Marland Kitchen UNFORTUNATELY she has 2 new wounds today. One on the left heel which is probably a pressure area. As well her previous amputation site of her right fourth toe has dehisced and now has a small wound with significant depth at the amputation site. 01/14/2016 -- she returns after 2 weeks and had had a punch biopsy of abdominal wound done the last visit -- had a biopsy of her midline abdominal wound done and the Pathology diagnosis is that of ulceration, [name], [name]. ([id number]) necrosis and inflammation and negative for dysplasia and malignancy. 01/21/16. 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 In the meantime she has new wounds of the left heel the right fourth toe amputation site is opened up. The back is stopped to the right heel area. 01/28/16; the abdominal wounds continued to improve. The extensive wound on her right Achilles also looks stable except for the lower aspect of the wound where there is a large  liquefied area that probes right down to her calcaneus. This cultured Proteus last week I have her on Augmentin and doxycycline 1. I think this is going to need a course of IV antibiotics and I will call dialysis. X-ray I did last week was negative, I think she is going to need an MRI 02/04/16; right heel MRI as before Saturday. Receiving I believe IV Rocephin at dialysis 02/11/16;  as it turns out the patient could not have a MRI as she has a bladder stimulator in place even though it is not currently in use since the beginning of this year. Although she has an allergy to IV contrast she apparently has done well with premedication so we will have to go for a CT scan with contrast. In the meantime she has had a fall now has a large skin tear on her left upper arm. She went to the ER and they suggested Tegaderm over topical antibiotics 03/03/16 currently patient returns after having been hospitalized for 2 weeks and subsequently transferred to [facility] nursing facility. She actually seems to be doing excellent compared to even when we last saw her according to our nursing staff. Both patient and her daughter are extremely encouraged at how well she is presenting at this point in time. Overall the biggest issue is still the right Achilles area which is being managed at this point in time by Dr. Wyn Quaker. Patient is currently utilizing a wound VAC to that region. Electronic Signature(s) Signed: 03/10/2016 1:46:02 PM By: Elliot Gurney RN, BSN, Kim RN, BSN Signed: 03/11/2016 4:52:13 PM By: Lenda Kelp PA-C Previous Signature: 03/05/2016 1:37:32 AM Version By: Lenda Kelp PA-C Entered By: Elliot Gurney RN, BSN, Kim on 03/10/2016 13:37:21 [place], Alioto Vinetta Bergamo ([id number]) -------------------------------------------------------------------------------- Physical Exam Details Patient Name: DORY, DEMONT. Date of Service: 03/03/2016 2:15 PM Medical Record Number: [id number] Patient Account Number: [id number] Date of Birth/Sex: 29-Aug-1933 (82 [name] Female) Treating RN: Huel Coventry Primary Care Physician: [name] Other Clinician: Referring Physician: [name] Treating Physician/Extender: Linwood Dibbles, HOYT Weeks in Treatment: 11 Constitutional Well-nourished and well-hydrated in no acute distress. Respiratory normal breathing without difficulty. clear to auscultation  bilaterally. Cardiovascular regular rate and rhythm with normal S1, S2. No significant edema noted patient has no clubbing or cyanosis. Psychiatric this patient is able to make decisions and demonstrates good insight into disease process. Alert and Oriented x 3. pleasant and cooperative. Electronic Signature(s) Signed: 03/10/2016 1:46:02 PM By: Elliot Gurney RN, BSN, Kim RN, BSN Signed: 03/11/2016 4:52:13 PM By: Lenda Kelp PA-C Previous Signature: 03/05/2016 1:37:32 AM Version By: Lenda Kelp PA-C Entered By: Elliot Gurney RN, BSN, Kim on 03/10/2016 13:37:33 [place], Li[place]ak Vinetta Bergamo ([id number]) -------------------------------------------------------------------------------- Physician Orders Details Patient Name: ARYANNA, SHAVER. Date of Service: 03/03/2016 2:15 PM Medical Record Number: [id number] Patient Account Number: [id number] Date of Birth/Sex: 10-16-1933 (82 [name] Female) Treating RN: Huel Coventry Primary Care Physician: [name] Other Clinician: Referring Physician: [name] Treating Physician/Extender: Linwood Dibbles, HOYT Weeks in Treatment: 13 Verbal / Phone Orders: Yes Clinician: Huel Coventry Read Back and Verified: Yes Diagnosis Coding ICD-10 Coding Code Description Unspecified open wound of abdominal wall, left lower quadrant without penetration into S31.104A peritoneal cavity, initial encounter E11.621 Type 2 diabetes mellitus with foot ulcer E11.51 Type 2 diabetes mellitus with diabetic peripheral angiopathy without gangrene Wound Cleansing Wound #11 Midline Abdomen - Lower Quadrant o Clean wound with Normal Saline. Primary Wound Dressing Wound #13 Left Calcaneus o Aquacel Ag - HHRN to please order this for patient o  Sorbalgon Ag - in clinic Wound #11 Midline Abdomen - Lower Quadrant o Prisma Ag Secondary Dressing Wound #11 Midline Abdomen - Lower Quadrant o Boardered Foam Dressing Wound #13 Left Calcaneus o Boardered Foam Dressing Wound #14 Right  Amputation Site - Digit o Boardered Foam Dressing Wound #9 Right Achilles o Conform/Kerlix o Foam Dressing Change Frequency Wound #11 Midline Abdomen - Lower Quadrant o Change Dressing Monday, Wednesday, Friday - or as needed MANDEE, PLUTA ([id number]) Wound #13 Left Calcaneus o Change Dressing Monday, Wednesday, Friday - or as needed Wound #14 Right Amputation Site - Digit o Change Dressing Monday, Wednesday, Friday - or as needed Follow-up Appointments Wound #11 Midline Abdomen - Lower Quadrant o Return Appointment in 2 weeks. Wound #13 Left Calcaneus o Return Appointment in 2 weeks. Wound #14 Right Amputation Site - Digit o Return Appointment in 2 weeks. Wound #9 Right Achilles o Return Appointment in 2 weeks. Off-Loading Wound #13 Left Calcaneus o Other: - Float heels Wound #9 Right Achilles o Other: - Float heels Electronic Signature(s) Signed: 03/05/2016 1:37:32 AM By: Lenda Kelp PA-C Signed: 03/05/2016 4:28:52 PM By: Elliot Gurney RN, BSN, Kim RN, BSN Entered By: Elliot Gurney, RN, BSN, Kim on 03/03/2016 15:24:12 [name] ([id number]) -------------------------------------------------------------------------------- Problem List Details Patient Name: SIONA, COULSTON. Date of Service: 03/03/2016 2:15 PM Medical Record Number: [id number] Patient Account Number: [id number] Date of Birth/Sex: [birth year] (82 [name] Female) Treating RN: Huel Coventry Primary Care Physician: [name] Other Clinician: Referring Physician: [name] Treating Physician/Extender: Linwood Dibbles, HOYT Weeks in Treatment: 11 Active Problems ICD-10 Encounter Code Description Active Date Diagnosis S31.104A Unspecified open wound of abdominal wall, left lower 12/17/2015 Yes quadrant without penetration into peritoneal cavity, initial encounter E11.621 Type 2 diabetes mellitus with foot ulcer 12/17/2015 Yes E11.51 Type 2 diabetes mellitus with diabetic peripheral 12/17/2015  Yes angiopathy without gangrene Inactive Problems Resolved Problems Electronic Signature(s) Signed: 03/10/2016 1:46:02 PM By: Elliot Gurney RN, BSN, Kim RN, BSN Signed: 03/11/2016 4:52:13 PM By: Lenda Kelp PA-C Previous Signature: 03/05/2016 1:37:32 AM Version By: Lenda Kelp PA-C Entered By: Elliot Gurney RN, BSN, Kim on 03/10/2016 13:37:01 [name] ([id number]) -------------------------------------------------------------------------------- Progress Note Details Patient Name: MERARI, PION. Date of Service: 03/03/2016 2:15 PM Medical Record Number: [id number] Patient Account Number: [id number] Date of Birth/Sex: 1934-03-17 (82 [name] Female) Treating RN: Huel Coventry Primary Care Physician: [name] Other Clinician: Referring Physician: [name] Treating Physician/Extender: Linwood Dibbles, HOYT Weeks in Treatment: 11 Subjective Chief Complaint Information obtained from Patient Patient is seen today for evaluation concerning her ongoing lower abdominal wound midline to left as well as her bilateral heel wounds and her right Achilles wound. History of Present Illness (HPI) The following HPI elements were documented for the patient's wound: Location: Currently patient has just remaining a wound on her lower midline abdominal region, right fourth toe amputation site, and right Achilles region. Quality: Patient is not not feeling any discomfort at this point in time and tells me that she is feeling "great" Severity: 1 out of 10 Duration: Patient has been hospitalized recently and then transferred to the skilled nursing facility where she now is receiving care as well as regard to her wounds. Timing: Pain in wound is Intermittent but for the most part stays away at this time Context: The wound appeared gradually over time Modifying Factors: Wound interventions which have been performed at [facility] skilled nursing facility seem to have been helpful at this point in time.  Patient's daughter as well  as patient are very encouraged with the help she has received from the facility. Associated Signs and Symptoms: Patient has congestive heart failure, chronic respiratory failure, end-stage renal disease for which she currently is in dialysis, atrial fibrillation, diabetes mellitus 82 year old patient who is known to be diabetic, was referred to [place] by Dr. Gavin Potters for a right heel ulceration which she's had for a while. She was recently in hospital for a pneumonia and at that time and got delirious and was disoriented and sometime during this time developed a stage II ulcer on her right heel. Her past medical history is significant for bilateral pneumonia which was treated with injectable antibiotics and then to oral Levaquin which he has completed. She also has acute on chronic diastolic CHF, acute on chronic respiratory failure, end-stage renal disease on hemodialysis, atrial fibrillation, recent stroke, diabetes mellitus. The patient and her son are poor historians but from what I understand she was admitted to the hospital with an acute vascular compromise of her right lower extremity and Dr. Wyn Quaker has done a surgical procedure and we are trying to obtain these notes. There are also some vascular workup done and we will try and obtain these notes. the injury to the left lower quadrant of abdomen and the suprapubic area have been there due to a bruise and have been there for several months and no intervention has been done. 10/11/2015 -- on review of the [facility] records it was noted that the patient was admitted to the hospital on 09/14/2015 with peripheral vascular disease with claudication, end-stage renal disease, pressure ulcer, chronic atrial fibrillation. She was seen by Dr. Wyn Quaker who did her right lower extremity angiogram , [name], [name] ([id number]) angioplasty of the right anterior tibial artery and thrombolysis with TPA of the right popliteal artery,  and thrombectomy. She was seen by Dr. Wyn Quaker during this past week and he was pleased with the progress. He did say that if he took her to the operating room for any procedure he would debride the abdominal wound under anesthesia. She was also seen by Dr. Ether Griffins the podiatrist who thought that she may lose her right fourth toe at some stage may need an amputation of this. 10/21/2015 --patient known to Dr. Wyn Quaker and his last office visit from 10/04/2015 has been reviewed. 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 She also had a persistent ulceration of the right heel and markedly this area and a right third and fourth toe and a small scab on the calf but these are dry and seemed to be improving. Patient also has a left carotid endarterectomy and multiple interventions to a right brachiocephalic AV fistula. After the visit he had recommended noninvasive studies to recheck her revascularization. He was off the impression that she would likely lose the right fourth toe and the third toe was likely to heal. He was concerned about underlying muscle necrosis on her right heel and midfoot. 11/01/2015 -- an echo done in January of this year showed her left ventricular ejection fraction to be about 50-55%. The patient was seen by the PA and Dr. Driscilla Grammes office and the plan was to take her to the operating room soon to have a debridement under anesthesia for the abdominal wall wound, the Achilles tendon on the right leg and amputation of the right fourth toe. The daughter and the patient do not feel that they would be able  to undergo hyperbaric oxygen therapy 5 days a week for 6 weeks. 12/17/15; this is a medically complex woman who I note was recently in this clinic however I was not involved with her care. She returns today with multiple wounds; a) she has a wound in the mid abdomen that is  been there since March of this year. I note that she is been to the overall for debridement recently. The exact etiology of this wound is not really clear b) left lower quadrant abdominal wound had some sanguinous drainage when she came in here. The patient fell in January and thinks this may have been secondary to a hematoma. c): The patient has 3 wounds on her right leg including a small wound on the right mid calf, a large area over the Achilles which currently has a wound VAC for the last 6 weeks, also a smaller wound on the distal part of the right heel. As far as I understand most of these wounds are currently been dressed with's calcium alginate. According to her daughter the Achilles wound under the wound VAC is doing well d) the patient is had an amputation of her left fifth toe in January and the right fourth toe 6 weeks ago secondary to diabetic PAD e) the patient has chronic renal failure on dialysis for the last 2 years secondary to type 2 diabetes on insulin. The daughter's knowledge there is been no biopsy of the abdominal wounds given their current appearance and lack of undefined etiology at have to wonder about calciphylaxis. 12/18/15:Addendum; I [facility] reviewed [facility] I can see no relevant x-rays of the right heel. I note her arteriogram and revascularization of her right lower extremity in April 2017. She had debridement of both abdominal wounds and the right heel and Achilles wound on 11/07/15 at which time she had a right fourth toe ray amputation. The abdominal wounds were debridement again on 6/29. I do not see any relevant pathology of these abdominal wounds 12/24/15; culture I did of the drainage from the midline abdominal wound last week showed both Proteus and ampicillin sensitive enterococcus. I've given her a course of Augmentin adjusted on dialysis days. She has no specific complaints today. Been using Santyl to the abdominal wounds in the right leg wound and  the [name], [name]. ([id number]) wound VAC on the right Achilles which was initially prescribed by Dr. dew 12/31/15; I have done two punch biopsies of the large midline abdominal. 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 May have been a trauma component of the one on the left lower quadrant however the midline wound had no such history. She has a large area on the right Achilles heel with a wound VAC prescribed by Dr. dew. A small wound on the right anterior leg.Marland Kitchen UNFORTUNATELY she has 2 new wounds today. One on the left heel which is probably a pressure area. As well her previous amputation site of her right fourth toe has dehisced and now has a small wound with significant depth at the amputation site. 01/14/2016 -- she returns after 2 weeks and had had a punch biopsy of abdominal wound done the last visit -- had a biopsy of her midline abdominal wound done and the Pathology diagnosis is that of ulceration, necrosis and inflammation and negative for dysplasia and malignancy. 01/21/16. 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 In the meantime she has new wounds of the left heel the right fourth toe amputation  site is opened up. The back is stopped to the right heel area. 01/28/16; the abdominal wounds continued to improve. The extensive wound on her right Achilles also looks stable except for the lower aspect of the wound where there is a large liquefied area that probes right down to her calcaneus. This cultured Proteus last week I have her on Augmentin and doxycycline 1. I think this is going to need a course of IV antibiotics and I will call dialysis. X-ray I did last week was negative, I think she is going to need an MRI 02/04/16; right heel MRI as before Saturday. Receiving I believe IV Rocephin at dialysis 02/11/16; as it turns out the patient could not have a MRI as she has a bladder stimulator in place even though it is not  currently in use since the beginning of this year. Although she has an allergy to IV contrast she apparently has done well with premedication so we will have to go for a CT scan with contrast. In the meantime she has had a fall now has a large skin tear on her left upper arm. She went to the ER and they suggested Tegaderm over topical antibiotics 03/03/16 currently patient returns after having been hospitalized for 2 weeks and subsequently transferred to [facility] nursing facility. She actually seems to be doing excellent compared to even when we last saw her according to our nursing staff. Both patient and her daughter are extremely encouraged at how well she is presenting at this point in time. Overall the biggest issue is still the right Achilles area which is being managed at this point in time by Dr. Wyn Quaker. Patient is currently utilizing a wound VAC to that region. Objective Constitutional Well-nourished and well-hydrated in no acute distress. Vitals Time Taken: 2:30 PM, Height: 65 in, Weight: 160 lbs, BMI: 26.6, Temperature: 97.7 F, Pulse: 70 bpm, Respiratory Rate: 16 breaths/min, Blood Pressure: 101/43 mmHg. Respiratory normal breathing without difficulty. clear to auscultation bilaterally. Cardiovascular regular rate and rhythm with normal S1, S2. No significant edema noted patient has no clubbing or [name], [name]. ([id number]) cyanosis. Psychiatric this patient is able to make decisions and demonstrates good insight into disease process. Alert and Oriented x 3. pleasant and cooperative. Integumentary (Hair, Skin) Wound #10 status is Open. Original cause of wound was Gradually Appeared. The wound is located on the Right Calcaneus. The wound measures 0cm length x 0cm width x 0cm depth; 0cm^2 area and 0cm^3 volume. The wound is limited to skin breakdown. There is a medium amount of serous drainage noted. The wound margin is flat and intact. There is large (67-100%) pink granulation  within the wound bed. There is no necrotic tissue within the wound bed. The periwound skin appearance exhibited: Dry/Scaly, Erythema. The periwound skin appearance did not exhibit: Callus, Crepitus, Excoriation, Fluctuance, Friable, Induration, Localized Edema, Rash, Scarring, Maceration, Moist, Atrophie Blanche, Cyanosis, Ecchymosis, Hemosiderin Staining, Mottled, Pallor, Rubor. The surrounding wound skin color is noted with erythema which is circumferential. Periwound temperature was noted as No Abnormality. The periwound has tenderness on palpation. Wound #11 status is Open. Original cause of wound was Trauma. The wound is located on the Midline Abdomen - Lower Quadrant. The wound measures 0.4cm length x 0.4cm width x 0.1cm depth; 0.126cm^2 area and 0.013cm^3 volume. The wound is limited to skin breakdown. There is a large amount of serous drainage noted. The wound margin is flat and intact. There is large (67-100%) red granulation within the wound bed. There is  no necrotic tissue within the wound bed. The periwound skin appearance exhibited: Moist, Erythema. The periwound skin appearance did not exhibit: Callus, Crepitus, Excoriation, Fluctuance, Friable, Induration, Localized Edema, Rash, Scarring, Dry/Scaly, Maceration, Atrophie Blanche, Cyanosis, Ecchymosis, Hemosiderin Staining, Mottled, Pallor, Rubor. The surrounding wound skin color is noted with erythema which is circumferential. Periwound temperature was noted as No Abnormality. The periwound has tenderness on palpation. Wound #12 status is Healed - Epithelialized. Original cause of wound was Trauma. The wound is located on the Left Abdomen - Lower Quadrant. The wound measures 0cm length x 0cm width x 0cm depth; 0cm^2 area and 0cm^3 volume. The wound is limited to skin breakdown. There is no tunneling or undermining noted. There is a large amount of serous drainage noted. The wound margin is flat and intact. There is large (67-100%)  red granulation within the wound bed. There is no necrotic tissue within the wound bed. The periwound skin appearance exhibited: Moist, Erythema. The periwound skin appearance did not exhibit: Callus, Crepitus, Excoriation, Fluctuance, Friable, Induration, Localized Edema, Rash, Scarring, Dry/Scaly, Maceration, Atrophie Blanche, Cyanosis, Ecchymosis, Hemosiderin Staining, Mottled, Pallor, Rubor. The surrounding wound skin color is noted with erythema which is circumferential. Periwound temperature was noted as No Abnormality. The periwound has tenderness on palpation. Wound #13 status is Open. Original cause of wound was Gradually Appeared. The wound is located on the Left Calcaneus. The wound measures 0.7cm length x 0.7cm width x 0.1cm depth; 0.385cm^2 area and 0.038cm^3 volume. Wound #14 status is Open. Original cause of wound was Surgical Injury. The wound is located on the Right Amputation Site - Digit. The wound measures 0.6cm length x 0.5cm width x 0.1cm depth; 0.236cm^2 area and 0.024cm^3 volume. The wound is limited to skin breakdown. There is a medium amount of serous drainage noted. The wound margin is flat and intact. There is no granulation within the wound bed. There is a large (67-100%) amount of necrotic tissue within the wound bed including Eschar and Adherent Slough. Natalie RimaHOPKINS, Natalie Y. ([id number]) The periwound skin appearance exhibited: Maceration, Moist. The periwound skin appearance did not exhibit: Callus, Crepitus, Excoriation, Fluctuance, Friable, Induration, Localized Edema, Rash, Scarring, Dry/Scaly, Atrophie Blanche, Cyanosis, Ecchymosis, Hemosiderin Staining, Mottled, Pallor, Rubor, Erythema. Periwound temperature was noted as No Abnormality. Wound #15 status is Healed - Epithelialized. Original cause of wound was Trauma. The wound is located on the Left Forearm. The wound measures 0cm length x 0cm width x 0cm depth; 0cm^2 area and 0cm^3 volume. Wound #9 status is  Open. Original cause of wound was Gradually Appeared. The wound is located on the Right Achilles. The wound measures 10cm length x 3.4cm width x 0.2cm depth; 26.704cm^2 area and 5.341cm^3 volume. There is tendon exposed. There is tunneling at 12:00 with a maximum distance of 1.8cm. There is a large amount of serosanguineous drainage noted. The wound margin is thickened. There is large (67-100%) red granulation within the wound bed. There is a small (1-33%) amount of necrotic tissue within the wound bed including Eschar and Adherent Slough. The periwound skin appearance exhibited: Maceration, Moist, Erythema. The periwound skin appearance did not exhibit: Callus, Crepitus, Excoriation, Fluctuance, Friable, Induration, Localized Edema, Rash, Scarring, Dry/Scaly, Atrophie Blanche, Cyanosis, Ecchymosis, Hemosiderin Staining, Mottled, Pallor, Rubor. The surrounding wound skin color is noted with erythema which is circumferential. The periwound has tenderness on palpation. 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 There was a Skin/Subcutaneous Tissue Debridement ([id number]) debridement with total area of 0.16 sq cm performed by STONE III, HOYT. with the following instrument(s): Curette to remove Viable and Non-Viable tissue/material including Exudate, Fibrin/Slough, and Subcutaneous after achieving pain control using Lidocaine 4% Topical Solution. A time out was conducted at 03:14, prior to the start of the procedure. A Minimum amount of bleeding was controlled with Pressure. The procedure was tolerated well with a pain level of 0  throughout and a pain level of 0 following the procedure. Post Debridement Measurements: 0.4cm length x 0.5cm width x 0.1cm depth; 0.016cm^3 volume. Character of Wound/Ulcer Post Debridement is improved. Severity of Tissue Post Debridement is: Fat layer [name], SAMAD ([id number]) exposed. Post procedure Diagnosis Wound #11: Same as Pre-Procedure Plan Wound Cleansing: Wound #11 Midline Abdomen - Lower Quadrant: Clean wound with Normal Saline. Primary Wound Dressing: Wound #13 Left Calcaneus: Aquacel Ag - HHRN to please order this for patient Sorbalgon Ag - in clinic Wound #11 Midline Abdomen - Lower Quadrant: Prisma Ag Secondary Dressing: Wound #11 Midline Abdomen - Lower Quadrant: Boardered Foam Dressing Wound #13 Left Calcaneus: Boardered Foam Dressing Wound #14 Right Amputation Site - Digit: Boardered Foam Dressing Wound #9 Right Achilles: Conform/Kerlix Foam Dressing Change Frequency: Wound #11 Midline Abdomen - Lower Quadrant: Change Dressing Monday, Wednesday, Friday - or as needed Wound #13 Left Calcaneus: Change Dressing Monday, Wednesday, Friday - or as needed Wound #14 Right Amputation Site - Digit: Change Dressing Monday, Wednesday, Friday - or as needed Follow-up Appointments: Wound #11 Midline Abdomen - Lower Quadrant: Return Appointment in 2 weeks. Wound #13 Left Calcaneus: Return Appointment in 2 weeks. Wound #14 Right Amputation Site - Digit: Return Appointment in 2 weeks. Wound #9 Right Achilles: Return Appointment in 2 weeks. Off-Loading: Wound #13 Left Calcaneus: Other: - Float heels Wound #9 Right Achilles: Other: - Float heels LEVADA, BOWERSOX ([id number]) Follow-Up Appointments: A follow-up appointment should be scheduled. Medication Reconciliation completed and provided to Patient/Care Provider. A Patient Clinical Summary of Care was provided to [facility] Patient currently is doing extremely well with recommendations. Her wound on the left arm,  left abdominal region, and bilateral heels have healed at this point. The wound at the location of her fourth toe amputation site is dry and stable. Betadine is being applied to that location. In regard to the right to midline abdominal wound this also looks excellent at this point although the lateral portion of this did require some debridement today. Patient tolerated this well with really no discomfort. We are to proceed with a collagen dressing in regard to this wound region. For the heels bilaterally I recommended a border foam dressing and subsequently for the Achilles region she will be seeing Dr. Wyn Quaker and just a little while this afternoon and he will be addressing whether to continue with the wound VAC or not. Otherwise all questions and concerns were answered to the best of my ability and we will see her for follow-up visit in 2 weeks. Electronic Signature(s) Signed: 03/25/2016 5:09:19 PM By: Lenda Kelp PA-C Previous Signature: 03/10/2016 1:46:02 PM Version By: Elliot Gurney RN, BSN, Kim RN, BSN Previous Signature: 03/11/2016 4:52:13 PM Version By: Lenda Kelp PA-C Previous Signature: 03/05/2016 1:37:32 AM Version By: Lenda Kelp PA-C Entered By: Lenda Kelp on 03/25/2016 17:09:19  JAKEYA, GHERARDI ([id number]) -------------------------------------------------------------------------------- SuperBill Details Patient Name: PHILAMENA, KRAMAR. Date of Service: 03/03/2016 Medical Record Number: [id number] Patient Account Number: [id number] Date of Birth/Sex: 10-17-1933 (82 [name] Female) Treating RN: Huel Coventry Primary Care Physician: [name] Other Clinician: Referring Physician: [name] Treating Physician/Extender: Linwood Dibbles, HOYT Weeks in Treatment: 11 Diagnosis Coding ICD-10 Codes Code Description Unspecified open wound of abdominal wall, left lower quadrant without penetration into S31.104A peritoneal cavity, initial encounter E11.621 Type 2 diabetes  mellitus with foot ulcer E11.51 Type 2 diabetes mellitus with diabetic peripheral angiopathy without gangrene Facility Procedures CPT4: Description Modifier Quantity Code 08657846 11042 - DEB SUBQ TISSUE 20 SQ CM/< 1 ICD-10 Description Diagnosis S31.104A Unspecified open wound of abdominal wall, left lower quadrant without penetration into peritoneal cavity, initial encounter  E11.621 Type 2 diabetes mellitus with foot ulcer Physician Procedures CPT4: Description Modifier Quantity Code [id number] 11042 - WC PHYS SUBQ TISS 20 SQ CM 1 ICD-10 Description Diagnosis S31.104A Unspecified open wound of abdominal wall, left lower quadrant without penetration into peritoneal cavity, initial encounter  E11.621 Type 2 diabetes mellitus with foot ulcer Electronic Signature(s) Signed: 03/10/2016 1:46:02 PM By: Elliot Gurney RN, BSN, Kim RN, BSN Signed: 03/11/2016 4:52:13 PM By: Lenda Kelp PA-C Previous Signature: 03/05/2016 1:37:32 AM Version By: Lenda Kelp PA-C Entered By: Elliot Gurney RN, BSN, Kim on 03/10/2016 13:36:28

## 2016-03-17 ENCOUNTER — Encounter: Payer: Medicare Other | Admitting: Internal Medicine

## 2016-03-17 DIAGNOSIS — E11621 Type 2 diabetes mellitus with foot ulcer: Secondary | ICD-10-CM | POA: Diagnosis not present

## 2016-03-18 NOTE — Telephone Encounter (Signed)
Samul DadaRuth Stout is needing to speak to nurse concerning this pt again.

## 2016-03-18 NOTE — Progress Notes (Signed)
[name], [name] ([id number]) Visit Report for 03/17/2016 Chief Complaint Document Details Patient Name: [name]. Date of Service: 03/17/2016 8:00 AM Medical Record Patient Account Number: [id number] Number: Treating RN: Phillis Haggis 1933-12-26 (82 y.o. Other Clinician: Date of Birth/Sex: Female) Treating Sherra Kimmons Primary Care Physician/Extender: Rex Kras, Heidi Physician: Referring Physician: Charolotte Capuchin in Treatment: 13 Information Obtained from: Patient Chief Complaint Patient is seen today for evaluation concerning her ongoing lower abdominal wound midline to left as well as her bilateral heel wounds and her right Achilles wound. Electronic Signature(s) Signed: 03/18/2016 8:01:59 AM By: Baltazar Najjar MD Entered By: Baltazar Najjar on 03/17/2016 09:11:54 [name] ([id number]) -------------------------------------------------------------------------------- Debridement Details Patient Name: [name] Date of Service: 03/17/2016 8:00 AM Medical Record Patient Account Number: [id number] Number: Treating RN: Phillis Haggis Mar 21, 1934 (82 y.o. Other Clinician: Date of Birth/Sex: Female) Treating Javonna Balli Primary Care Physician/Extender: Rex Kras, Heidi Physician: Referring Physician: Charolotte Capuchin in Treatment: 13 Debridement Performed for Wound #13 Left Calcaneus Assessment: Performed By: Physician Maxwell Caul, MD Debridement: Debridement Pre-procedure Yes - 08:40 Verification/Time Out Taken: Start Time: 08:41 Pain Control: Lidocaine 4% Topical Solution Level: Skin/Subcutaneous Tissue Total Area Debrided (L x 0.1 (cm) x 0.1 (cm) = 0.01 (cm) W): Tissue and other Viable, Non-Viable, Exudate, Fibrin/Slough, Subcutaneous material debrided: Instrument: Curette Bleeding: Minimum Hemostasis Achieved: Pressure End Time: 08:43 Procedural Pain: 0 Post Procedural Pain: 0 Response to  Treatment: Procedure was tolerated well Post Debridement Measurements of Total Wound Length: (cm) 1 Width: (cm) 0.5 Depth: (cm) 0.1 Volume: (cm) 0.039 Character of Wound/Ulcer Post Stable Debridement: Severity of Tissue Post Debridement: Fat layer exposed Post Procedure Diagnosis Same as Pre-procedure Electronic Signature(s) Signed: 03/17/2016 5:14:55 PM By: Adair Laundry ([id number]) Signed: 03/18/2016 8:01:59 AM By: Baltazar Najjar MD Entered By: Baltazar Najjar on 03/17/2016 09:10:42 [name] ([id number]) -------------------------------------------------------------------------------- Debridement Details Patient Name: [name]. Date of Service: 03/17/2016 8:00 AM Medical Record Patient Account Number: [id number] Number: Treating RN: Phillis Haggis [birth year] (82 y.o. Other Clinician: Date of Birth/Sex: Female) Treating [name] Primary Care Physician/Extender: Rex Kras, Heidi Physician: Referring Physician: Charolotte Capuchin in Treatment: 13 Debridement Performed for Wound #14 Right Amputation Site - Digit Assessment: Performed By: Physician Maxwell Caul, MD Debridement: Debridement Pre-procedure Yes - 08:40 Verification/Time Out Taken: Start Time: 08:50 Pain Control: Lidocaine 4% Topical Solution Level: Skin/Subcutaneous Tissue Total Area Debrided (L x 0.5 (cm) x 0.4 (cm) = 0.2 (cm) W): Tissue and other Viable, Non-Viable, Exudate, Fibrin/Slough, Subcutaneous material debrided: Instrument: Curette Bleeding: Minimum Hemostasis Achieved: Pressure End Time: 08:52 Procedural Pain: 0 Post Procedural Pain: 0 Response to Treatment: Procedure was tolerated well Post Debridement Measurements of Total Wound Length: (cm) 0.5 Width: (cm) 0.4 Depth: (cm) 0.1 Volume: (cm) 0.016 Character of Wound/Ulcer Post Stable Debridement: Severity of Tissue Post Debridement: Fat layer exposed Post Procedure  Diagnosis Same as Pre-procedure Electronic Signature(s) Signed: 03/17/2016 5:14:55 PM By: Adair Laundry ([id number]) Signed: 03/18/2016 8:01:59 AM By: Baltazar Najjar MD Entered By: Baltazar Najjar on 03/17/2016 09:11:04 [name] ([id number]) -------------------------------------------------------------------------------- Debridement Details Patient Name: [name]. Date of Service: 03/17/2016 8:00 AM Medical Record Patient Account Number: [id number] Number: Treating RN: Phillis Haggis April 16, 1934 (82 y.o. Other Clinician: Date of Birth/Sex: Female) Treating [name] Primary Care Physician/Extender: Rex Kras, Heidi Physician: Referring Physician: Charolotte Capuchin in Treatment: 13 Debridement Performed for Wound #9 Right Achilles Assessment: Performed By: Physician [name],  [name], MD Debridement: Debridement Pre-procedure Yes - 08:40 Verification/Time Out Taken: Start Time: 08:43 Pain Control: Lidocaine 4% Topical Solution Level: Skin/Subcutaneous Tissue Total Area Debrided (L x 10.9 (cm) x 3.5 (cm) = 38.15 (cm) W): Tissue and other Viable, Non-Viable, Exudate, Fibrin/Slough, Subcutaneous material debrided: Instrument: Curette Bleeding: Minimum Hemostasis Achieved: Pressure End Time: 08:48 Procedural Pain: 0 Post Procedural Pain: 0 Response to Treatment: Procedure was tolerated well Post Debridement Measurements of Total Wound Length: (cm) 10.9 Width: (cm) 3.5 Depth: (cm) 0.2 Volume: (cm) 5.993 Character of Wound/Ulcer Post Requires Further Debridement Debridement: Severity of Tissue Post Debridement: Fat layer exposed Post Procedure Diagnosis Same as Pre-procedure Notes tunneling at [employer] [name], [name] ([id number]) Electronic Signature(s) Signed: 03/17/2016 5:14:55 PM By: Alejandro Mulling Signed: 03/18/2016 8:01:59 AM By: Baltazar Najjar MD Entered By: Baltazar Najjar on 03/17/2016  09:11:22 [name] ([id number]) -------------------------------------------------------------------------------- HPI Details Patient Name: [name] Date of Service: 03/17/2016 8:00 AM Medical Record Patient Account Number: [id number] Number: Treating RN: Phillis Haggis [birth year] (82 y.o. Other Clinician: Date of Birth/Sex: Female) Treating Kaly Mcquary Primary Care Physician/Extender: Rex Kras, Heidi Physician: Referring Physician: Charolotte Capuchin in Treatment: 13 History of Present Illness Location: Currently patient has just remaining a wound on her lower midline abdominal region, right fourth toe amputation site, and right Achilles region. Quality: Patient is not not feeling any discomfort at this point in time and tells me that she is feeling "great" Severity: 1 out of 10 Duration: Patient has been hospitalized recently and then transferred to the skilled nursing facility where she now is receiving care as well as regard to her wounds. Timing: Pain in wound is Intermittent but for the most part stays away at this time Context: The wound appeared gradually over time Modifying Factors: Wound interventions which have been performed at [facility] skilled nursing facility seem to have been helpful at this point in time. Patient's daughter as well as patient are very encouraged with the help she has received from the facility. Associated Signs and Symptoms: Patient has congestive heart failure, chronic respiratory failure, end- stage renal disease for which she currently is in dialysis, atrial fibrillation, diabetes mellitus HPI Description: 82 year old patient who is known to be diabetic, was referred to [place] by Dr. Gavin Potters for a right heel ulceration which she's had for a while. She was recently in hospital for a pneumonia and at that time and got delirious and was disoriented and sometime during this time developed a stage II ulcer on her right  heel. Her past medical history is significant for bilateral pneumonia which was treated with injectable antibiotics and then to oral Levaquin which he has completed. She also has acute on chronic diastolic CHF, acute on chronic respiratory failure, end-stage renal disease on hemodialysis, atrial fibrillation, recent stroke, diabetes mellitus. The patient and her son are poor historians but from what I understand she was admitted to the hospital with an acute vascular compromise of her right lower extremity and Dr. Wyn Quaker has done a surgical procedure and we are trying to obtain these notes. There are also some vascular workup done and we will try and obtain these notes. the injury to the left lower quadrant of abdomen and the suprapubic area have been there due to a bruise and have been there for several months and no intervention has been done. 10/11/2015 -- on review of the electronics records it was noted that the patient was admitted to the hospital on 09/14/2015 with  peripheral vascular disease with claudication, end-stage renal disease, pressure ulcer, chronic atrial fibrillation. She was seen by Dr. Wyn Quaker who did her right lower extremity angiogram , angioplasty of the right anterior tibial artery and thrombolysis with TPA of the right popliteal artery, and thrombectomy. She was seen by Dr. Wyn Quaker during this past week and he was pleased with the progress. He did say that if he took her to the operating room for any procedure he would debride the abdominal wound under anesthesia. She was also seen by Dr. Ether Griffins the podiatrist who thought that she may lose her right fourth toe at some stage may need an amputation of this. JIN, SHOCKLEY ([id number]) 10/21/2015 --patient known to Dr. Wyn Quaker and his last office visit from 10/04/2015 has been reviewed. 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 She also had a persistent ulceration of the right heel and markedly this area and a right third and fourth toe and a small scab on the calf but these are dry and seemed to be improving. Patient also has a left carotid endarterectomy and multiple interventions to a right brachiocephalic AV fistula. After the visit he had recommended noninvasive studies to recheck her revascularization. He was off the impression that she would likely lose the right fourth toe and the third toe was likely to heal. He was concerned about underlying muscle necrosis on her right heel and midfoot. 11/01/2015 -- an echo done in January of this year showed her left ventricular ejection fraction to be about 50-55%. The patient was seen by the PA and Dr. Driscilla Grammes office and the plan was to take her to the operating room soon to have a debridement under anesthesia for the abdominal wall wound, the Achilles tendon on the right leg and amputation of the right fourth toe. The daughter and the patient do not feel that they would be able to undergo hyperbaric oxygen therapy 5 days a week for 6 weeks. 12/17/15; this is a medically complex woman who I note was recently in this clinic however I was not involved with her care. She returns today with multiple wounds; a) she has a wound in the mid abdomen that is been there since March of this year. I note that she is been to the overall for debridement recently. The exact etiology of this wound is not really clear b) left lower quadrant abdominal wound had some sanguinous drainage when she came in here. The patient fell in January and thinks this may have been secondary to a hematoma. c): The patient has 3 wounds on her right leg including a small wound on the right mid calf, a large area over the Achilles which currently has a wound VAC for the last 6 weeks, also a smaller wound on the distal part of the right heel. As far as I understand most of these wounds are  currently been dressed with's calcium alginate. According to her daughter the Achilles wound under the wound VAC is doing well d) the patient is had an amputation of her left fifth toe in January and the right fourth toe 6 weeks ago secondary to diabetic PAD e) the patient has chronic renal failure on dialysis for the last 2 years secondary to type 2 diabetes on insulin. The daughter's knowledge there is been no biopsy of the abdominal wounds given their current appearance and lack of undefined etiology at have to  wonder about calciphylaxis. 12/18/15:Addendum; I [facility] I can see no relevant x-rays of the right heel. I note her arteriogram and revascularization of her right lower extremity in April 2017. She had debridement of both abdominal wounds and the right heel and Achilles wound on 11/07/15 at which time she had a right fourth toe ray amputation. The abdominal wounds were debridement again on 6/29. I do not see any relevant pathology of these abdominal wounds 12/24/15; culture I did of the drainage from the midline abdominal wound last week showed both Proteus and ampicillin sensitive enterococcus. I've given her a course of Augmentin adjusted on dialysis days. She has no specific complaints today. Been using Santyl to the abdominal wounds in the right leg wound and the wound VAC on the right Achilles which was initially prescribed by Dr. dew 12/31/15; I have done two punch biopsies of the large midline abdominal. 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 May have been a trauma component of the one on the left lower quadrant however the midline wound had no such history. She has a large area on the right Achilles heel with a wound VAC prescribed by Dr. dew. A small wound on the right anterior leg.Marland Kitchen. UNFORTUNATELY she has 2 new wounds today. One on the left heel which is probably a pressure area. As well her previous amputation site of her right fourth toe has Natalie RimaHOPKINS, Timmy Y.  ([id number]) dehisced and now has a small wound with significant depth at the amputation site. 01/14/2016 -- she returns after 2 weeks and had had a punch biopsy of abdominal wound done the last visit -- had a biopsy of her midline abdominal wound done and the Pathology diagnosis is that of ulceration, necrosis and inflammation and negative for dysplasia and malignancy. 01/21/16. 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 In the meantime she has new wounds of the left heel the right fourth toe amputation site is opened up. The back is stopped to the right heel area. 01/28/16; the abdominal wounds continued to improve. The extensive wound on her right Achilles also looks stable except for the lower aspect of the wound where there is a large liquefied area that probes right down to her calcaneus. This cultured Proteus last week I have her on Augmentin and doxycycline 1. I think this is going to need a course of IV antibiotics and I will call dialysis. X-ray I did last week was negative, I think she is going to need an MRI 02/04/16; right heel MRI as before Saturday. Receiving I believe IV Rocephin at dialysis 02/11/16; as it turns out the patient could not have a MRI as she has a bladder stimulator in place even though it is not currently in use since the beginning of this year. Although she has an allergy to IV contrast she apparently has done well with premedication so we will have to go for a CT scan with contrast. In the meantime she has had a fall now has a large skin tear on her left upper arm. She went to the ER and they suggested Tegaderm over topical antibiotics 03/03/16 currently patient returns after having been hospitalized for 2 weeks and [facility]. She actually seems to be doing excellent compared to even when we last saw her according to our nursing staff. Both patient and her  daughter are extremely encouraged at how well she is presenting at this point in  time. Overall the biggest issue is still the right Achilles area which is being managed at this point in time by Dr. Wyn Quaker. Patient is currently utilizing a wound VAC to that region. 03/17/16; patient is at [facility] nursing home still. Using Aquacel Ag to the wounds on her bilateral feet and still Prisma to the small open area on her abdomen. Electronic Signature(s) Signed: 03/18/2016 8:01:59 AM By: Baltazar Najjar MD Entered By: Baltazar Najjar on 03/17/2016 09:12:47 [name] ([id number]) -------------------------------------------------------------------------------- Physical Exam Details Patient Name: CORY, RAMA. Date of Service: 03/17/2016 8:00 AM Medical Record Patient Account Number: [id number] Number: Treating RN: Phillis Haggis July 15, 1933 (82 y.o. Other Clinician: Date of Birth/Sex: Female) Treating Coden Franchi Primary Care Physician/Extender: Rex Kras, Heidi Physician: Referring Physician: Charolotte Capuchin in Treatment: 13 Constitutional Sitting or standing Blood Pressure is within target range for patient.. Pulse regular and within target range for patient.Marland Kitchen Respirations regular, non-labored and within target range.. Temperature is normal and within the target range for the patient.. Patient's appearance is neat and clean. Appears in no acute distress. Well nourished and well developed.. Eyes Conjunctivae have some redness. Mild discharge noted.Marland Kitchen Respiratory Above normal respiratory effort noted. Respiratory rate elveaated. Bilateral breath sounds are clear and equal in all lobes with no wheezes, rales or rhonchi.. Cardiovascular . Pedal pulses palpable and strong bilaterally. 2. We'll fill time is normal. Psychiatric No evidence of depression, anxiety, or agitation. Calm, cooperative, and communicative. Appropriate interactions and affect.. Notes Wound  exam; the patient substantial abdominal wounds are down to a small spot in the middle of the midline abdomen wound. The left flank wound is totally closed. These were advertised this calciphylaxis although the biopsy I did did not exactly show this oHer right Achilles wound is much better than last time I saw this still debrided of a gelatinous surface slough. She has some tunneling superiorly post debridement the tissue cleans up quite nicely. oLeft heel Achilles area still has a very tiny open area post debridement looks stable oAmputation site of her fourth toe still slightly open as well Electronic Signature(s) Signed: 03/18/2016 8:01:59 AM By: Baltazar Najjar MD Entered By: Baltazar Najjar on 03/17/2016 09:16:37 [name] ([id number]) -------------------------------------------------------------------------------- Physician Orders Details Patient Name: [name]. Date of Service: 03/17/2016 8:00 AM Medical Record Patient Account Number: [id number] Number: Treating RN: Phillis Haggis 06/13/1933 (82 y.o. Other Clinician: Date of Birth/Sex: Female) Treating Dacen Frayre Primary Care Physician/Extender: Rex Kras, Heidi Physician: Referring Physician: Charolotte Capuchin in Treatment: 21 Verbal / Phone Orders: Yes Clinician: Ashok Cordia, Debi Read Back and Verified: Yes Diagnosis Coding Wound Cleansing o Clean wound with Normal Saline. Anesthetic o Topical Lidocaine 4% cream applied to wound bed prior to debridement - all wounds for clinic use only Primary Wound Dressing Wound #11 Midline Abdomen - Lower Quadrant o Prisma Ag - moisten with saline Wound #13 Left Calcaneus o Aquacel Ag - HHRN to please order this for patient o Sorbalgon Ag - in clinic Wound #14 Right Amputation Site - Digit o Aquacel Ag - HHRN to please order this for patient o Sorbalgon Ag - in clinic Wound #9 Right Achilles o Aquacel Ag - HHRN to please order  this for patient ********At the 12 o'clock please use AqAg rope in that area in the tunnel******* o Sorbalgon Ag - in clinic Secondary Dressing Wound #11 Midline Abdomen - Lower Quadrant o Boardered Foam Dressing Wound #13 Left Calcaneus o Gauze and Kerlix/Conform - tape o Foam -  heel cup Wound #14 Right Amputation Site - Digit o Boardered Foam Dressing Wound #9 Right Achilles JENASIS, STRALEY ([id number]) o Conform/Kerlix o Foam Dressing Change Frequency Wound #11 Midline Abdomen - Lower Quadrant o Change Dressing Monday, Wednesday, Friday - or as needed Wound #13 Left Calcaneus o Change Dressing Monday, Wednesday, Friday - or as needed o Change Dressing Monday, Wednesday, Friday - or as needed Wound #14 Right Amputation Site - Digit o Change Dressing Monday, Wednesday, Friday - or as needed Follow-up Appointments Wound #11 Midline Abdomen - Lower Quadrant o Return Appointment in 2 weeks. Wound #13 Left Calcaneus o Return Appointment in 2 weeks. Wound #14 Right Amputation Site - Digit o Return Appointment in 2 weeks. Wound #9 Right Achilles o Return Appointment in 2 weeks. Off-Loading Wound #13 Left Calcaneus o Other: - Float heels Wound #9 Right Achilles o Other: - Float heels Electronic Signature(s) Signed: 03/17/2016 5:14:55 PM By: Alejandro Mulling Signed: 03/18/2016 8:01:59 AM By: Baltazar Najjar MD Entered By: Alejandro Mulling on 03/17/2016 09:19:34 [name] ([id number]) -------------------------------------------------------------------------------- Problem List Details Patient Name: CASANDRA, DALLAIRE. Date of Service: 03/17/2016 8:00 AM Medical Record Patient Account Number: [id number] Number: Treating RN: Phillis Haggis 1933-12-14 (82 y.o. Other Clinician: Date of Birth/Sex: Female) Treating Hakeen Shipes Primary Care Physician/Extender: Rex Kras, Heidi Physician: Referring Physician: Charolotte Capuchin in Treatment: 13 Active Problems ICD-10 Encounter Code Description Active Date Diagnosis S31.104A Unspecified open wound of abdominal wall, left lower 12/17/2015 Yes quadrant without penetration into peritoneal cavity, initial encounter E11.621 Type 2 diabetes mellitus with foot ulcer 12/17/2015 Yes E11.51 Type 2 diabetes mellitus with diabetic peripheral 12/17/2015 Yes angiopathy without gangrene Inactive Problems Resolved Problems Electronic Signature(s) Signed: 03/18/2016 8:01:59 AM By: Baltazar Najjar MD Entered By: Baltazar Najjar on 03/17/2016 09:10:15 [name] ([id number]) -------------------------------------------------------------------------------- Progress Note Details Patient Name: [name] Date of Service: 03/17/2016 8:00 AM Medical Record Patient Account Number: [id number] Number: Treating RN: Phillis Haggis 1934-05-18 (82 y.o. Other Clinician: Date of Birth/Sex: Female) Treating Camiyah Friberg Primary Care Physician/Extender: Rex Kras, Heidi Physician: Referring Physician: Charolotte Capuchin in Treatment: 13 Subjective Chief Complaint Information obtained from Patient Patient is seen today for evaluation concerning her ongoing lower abdominal wound midline to left as well as her bilateral heel wounds and her right Achilles wound. History of Present Illness (HPI) The following HPI elements were documented for the patient's wound: Location: Currently patient has just remaining a wound on her lower midline abdominal region, right fourth toe amputation site, and right Achilles region. Quality: Patient is not not feeling any discomfort at this point in time and tells me that she is feeling "great" Severity: 1 out of 10 Duration: Patient has been hospitalized recently and then transferred to the skilled nursing facility where she now is receiving care as well as regard to her wounds. Timing: Pain in wound is Intermittent  but for the most part stays away at this time Context: The wound appeared gradually over time Modifying Factors: Wound interventions which have been performed at [facility] skilled nursing facility seem to have been helpful at this point in time. Patient's daughter as well as patient are very encouraged with the help she has received from the facility. Associated Signs and Symptoms: Patient has congestive heart failure, chronic respiratory failure, end-stage renal disease for which she currently is in dialysis, atrial fibrillation, diabetes mellitus 82 year old patient who is known to be diabetic, was referred to [place] by Dr. Gavin Potters for a  right heel ulceration which she's had for a while. She was recently in hospital for a pneumonia and at that time and got delirious and was disoriented and sometime during this time developed a stage II ulcer on her right heel. Her past medical history is significant for bilateral pneumonia which was treated with injectable antibiotics and then to oral Levaquin which he has completed. She also has acute on chronic diastolic CHF, acute on chronic respiratory failure, end-stage renal disease on hemodialysis, atrial fibrillation, recent stroke, diabetes mellitus. The patient and her son are poor historians but from what I understand she was admitted to the hospital with an acute vascular compromise of her right lower extremity and Dr. Wyn Quaker has done a surgical procedure and we are trying to obtain these notes. There are also some vascular workup done and we will try and obtain these notes. the injury to the left lower quadrant of abdomen and the suprapubic area have been there due to a bruise and have been there for several months and no intervention has been done. EMMALIN, JAQUESS ([id number]) 10/11/2015 -- on review of the electronics records it was noted that the patient was admitted to the hospital on 09/14/2015 with peripheral vascular disease with claudication,  end-stage renal disease, pressure ulcer, chronic atrial fibrillation. She was seen by Dr. Wyn Quaker who did her right lower extremity angiogram , angioplasty of the right anterior tibial artery and thrombolysis with TPA of the right popliteal artery, and thrombectomy. She was seen by Dr. Wyn Quaker during this past week and he was pleased with the progress. He did say that if he took her to the operating room for any procedure he would debride the abdominal wound under anesthesia. She was also seen by Dr. Ether Griffins the podiatrist who thought that she may lose her right fourth toe at some stage may need an amputation of this. 10/21/2015 --patient known to Dr. Wyn Quaker and his last office visit from 10/04/2015 has been reviewed. 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 She also had a persistent ulceration of the right heel and markedly this area and a right third and fourth toe and a small scab on the calf but these are dry and seemed to be improving. Patient also has a left carotid endarterectomy and multiple interventions to a right brachiocephalic AV fistula. After the visit he had recommended noninvasive studies to recheck her revascularization. He was off the impression that she would likely lose the right fourth toe and the third toe was likely to heal. He was concerned about underlying muscle necrosis on her right heel and midfoot. 11/01/2015 -- an echo done in January of this year showed her left ventricular ejection fraction to be about 50-55%. The patient was seen by the PA and Dr. Driscilla Grammes office and the plan was to take her to the operating room soon to have a debridement under anesthesia for the abdominal wall wound, the Achilles tendon on the right leg and amputation of the right fourth toe. The daughter and the patient do not feel that they would be able to undergo hyperbaric oxygen  therapy 5 days a week for 6 weeks. 12/17/15; this is a medically complex woman who I note was recently in this clinic however I was not involved with her care. She returns today with multiple wounds; a) she has a wound in the mid abdomen that is been there since March  of this year. I note that she is been to the overall for debridement recently. The exact etiology of this wound is not really clear b) left lower quadrant abdominal wound had some sanguinous drainage when she came in here. The patient fell in January and thinks this may have been secondary to a hematoma. c): The patient has 3 wounds on her right leg including a small wound on the right mid calf, a large area over the Achilles which currently has a wound VAC for the last 6 weeks, also a smaller wound on the distal part of the right heel. As far as I understand most of these wounds are currently been dressed with's calcium alginate. According to her daughter the Achilles wound under the wound VAC is doing well d) the patient is had an amputation of her left fifth toe in January and the right fourth toe 6 weeks ago secondary to diabetic PAD e) the patient has chronic renal failure on dialysis for the last 2 years secondary to type 2 diabetes on insulin. The daughter's knowledge there is been no biopsy of the abdominal wounds given their current appearance and lack of undefined etiology at have to wonder about calciphylaxis. 12/18/15:Addendum; I have reviewed [facility] I can see no relevant x-rays of the right heel. I note her arteriogram and revascularization of her right lower extremity in April 2017. She had debridement of both abdominal wounds and the right heel and Achilles wound on 11/07/15 at which time she had a right fourth toe ray amputation. The abdominal wounds were debridement again on 6/29. I do not see any relevant pathology of these abdominal wounds CORINN, STOLTZFUS ([id number]) 12/24/15; culture I did of the  drainage from the midline abdominal wound last week showed both Proteus and ampicillin sensitive enterococcus. I've given her a course of Augmentin adjusted on dialysis days. She has no specific complaints today. Been using Santyl to the abdominal wounds in the right leg wound and the wound VAC on the right Achilles which was initially prescribed by Dr. dew 12/31/15; I have done two punch biopsies of the large midline abdominal. 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 May have been a trauma component of the one on the left lower quadrant however the midline wound had no such history. She has a large area on the right Achilles heel with a wound VAC prescribed by Dr. dew. A small wound on the right anterior leg.Marland Kitchen UNFORTUNATELY she has 2 new wounds today. One on the left heel which is probably a pressure area. As well her previous amputation site of her right fourth toe has dehisced and now has a small wound with significant depth at the amputation site. 01/14/2016 -- she returns after 2 weeks and had had a punch biopsy of abdominal wound done the last visit -- had a biopsy of her midline abdominal wound done and the Pathology diagnosis is that of ulceration, necrosis and inflammation and negative for dysplasia and malignancy. 01/21/16. 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 In the meantime she has new wounds of the left heel the right fourth toe amputation site is opened up. The back is stopped to the right heel area. 01/28/16; the abdominal wounds continued to improve. The extensive wound on her right Achilles also looks stable except for the lower aspect of the wound where there is a large liquefied area that probes right down to her calcaneus. This cultured Proteus last week  I have her on Augmentin and doxycycline 1. I think this is going to need a course of IV antibiotics and I will call dialysis. X-ray I did last week was  negative, I think she is going to need an MRI 02/04/16; right heel MRI as before Saturday. Receiving I believe IV Rocephin at dialysis 02/11/16; as it turns out the patient could not have a MRI as she has a bladder stimulator in place even though it is not currently in use since the beginning of this year. Although she has an allergy to IV contrast she apparently has done well with premedication so we will have to go for a CT scan with contrast. In the meantime she has had a fall now has a large skin tear on her left upper arm. She went to the ER and they suggested Tegaderm over topical antibiotics 03/03/16 currently patient returns after having been hospitalized for 2 weeks and subsequently transferred to [facility] nursing facility. She actually seems to be doing excellent compared to even when we last saw her according to our nursing staff. Both patient and her daughter are extremely encouraged at how well she is presenting at this point in time. Overall the biggest issue is still the right Achilles area which is being managed at this point in time by Dr. Wyn Quaker. Patient is currently utilizing a wound VAC to that region. 03/17/16; patient is at [facility] nursing home still. Using Aquacel Ag to the wounds on her bilateral feet and still Prisma to the small open area on her abdomen. Objective Constitutional Sitting or standing Blood Pressure is within target range for patient.. Pulse regular and within target range for patient.Marland Kitchen Respirations regular, non-labored and within target range.. Temperature is normal and within the target range for the patient.. Patient's appearance is neat and clean. Appears in no acute distress. Well nourished and well developed.. Vitals Time Taken: 8:11 AM, Height: 65 in, Weight: 160 lbs, BMI: 26.6, Temperature: 97.6 F, Pulse: 81 Emigh, Loeta Y. ([id number]) bpm, Respiratory Rate: 18 breaths/min, Blood Pressure: 105/53 mmHg. General Notes: Made Dr. Sedonia Small aware of  BP. Eyes Conjunctivae have some redness. Mild discharge noted.Marland Kitchen Respiratory Above normal respiratory effort noted. Respiratory rate elveaated. Bilateral breath sounds are clear and equal in all lobes with no wheezes, rales or rhonchi.. Cardiovascular Pedal pulses palpable and strong bilaterally. 2. We'll fill time is normal. Psychiatric No evidence of depression, anxiety, or agitation. Calm, cooperative, and communicative. Appropriate interactions and affect.. General Notes: Wound exam; the patient substantial abdominal wounds are down to a small spot in the middle of the midline abdomen wound. The left flank wound is totally closed. These were advertised this calciphylaxis although the biopsy I did did not exactly show this Her right Achilles wound is much better than last time I saw this still debrided of a gelatinous surface slough. She has some tunneling superiorly post debridement the tissue cleans up quite nicely. Left heel Achilles area still has a very tiny open area post debridement looks stable Amputation site of her fourth toe still slightly open as well Integumentary (Hair, Skin) Wound #11 status is Open. Original cause of wound was Trauma. The wound is located on the Midline Abdomen - Lower Quadrant. The wound measures 0.2cm length x 0.1cm width x 0.1cm depth; 0.016cm^2 area and 0.002cm^3 volume. The wound is limited to skin breakdown. There is no tunneling or undermining noted. There is a large amount of serosanguineous drainage noted. The wound margin is flat and intact. There is  large (67-100%) red granulation within the wound bed. There is no necrotic tissue within the wound bed. The periwound skin appearance exhibited: Moist, Erythema. The periwound skin appearance did not exhibit: Callus, Crepitus, Excoriation, Fluctuance, Friable, Induration, Localized Edema, Rash, Scarring, Dry/Scaly, Maceration, Atrophie Blanche, Cyanosis, Ecchymosis, Hemosiderin Staining, Mottled,  Pallor, Rubor. The surrounding wound skin color is noted with erythema which is circumferential. Periwound temperature was noted as No Abnormality. The periwound has tenderness on palpation. Wound #13 status is Open. Original cause of wound was Gradually Appeared. The wound is located on the Left Calcaneus. The wound measures 0.1cm length x 0.1cm width x 0.1cm depth; 0.008cm^2 area and 0.001cm^3 volume. The wound is limited to skin breakdown. There is no tunneling or undermining noted. There is a none present amount of drainage noted. The wound margin is distinct with the outline attached to the wound base. There is no granulation within the wound bed. There is a large (67-100%) amount of necrotic tissue within the wound bed including Eschar. The periwound skin appearance exhibited: Dry/Scaly. Wound #14 status is Open. Original cause of wound was Surgical Injury. The wound is located on the Right Amputation Site - Digit. The wound measures 0.5cm length x 0.4cm width x 0.1cm depth; 0.157cm^2 area and 0.016cm^3 volume. The wound is limited to skin breakdown. There is no tunneling noted. There is a medium amount of serous drainage noted. The wound margin is flat and intact. There is no granulation within the wound bed. There is a large (67-100%) amount of necrotic tissue within the wound bed including [name], MORNEAULT ([id number]) Eschar and Adherent Slough. The periwound skin appearance exhibited: Maceration, Moist. The periwound skin appearance did not exhibit: Callus, Crepitus, Excoriation, Fluctuance, Friable, Induration, Localized Edema, Rash, Scarring, Dry/Scaly, Atrophie Blanche, Cyanosis, Ecchymosis, Hemosiderin Staining, Mottled, Pallor, Rubor, Erythema. Periwound temperature was noted as No Abnormality. Wound #9 status is Open. Original cause of wound was Gradually Appeared. The wound is located on the Right Achilles. The wound measures 10.9cm length x 3.5cm width x 0.2cm depth;  29.963cm^2 area and 5.993cm^3 volume. There is tendon exposed. There is no tunneling or undermining noted. There is a large amount of serosanguineous drainage noted. The wound margin is thickened. There is large (67-100%) red granulation within the wound bed. There is a small (1-33%) amount of necrotic tissue within the wound bed including Eschar and Adherent Slough. The periwound skin appearance exhibited: Maceration, Moist, Erythema. The periwound skin appearance did not exhibit: Callus, Crepitus, Excoriation, Fluctuance, Friable, Induration, Localized Edema, Rash, Scarring, Dry/Scaly, Atrophie Blanche, Cyanosis, Ecchymosis, Hemosiderin Staining, Mottled, Pallor, Rubor. The surrounding wound skin color is noted with erythema which is circumferential. The periwound has tenderness on palpation. Assessment Active Problems ICD-10 S31.104A - Unspecified open wound of abdominal wall, left lower quadrant without penetration into peritoneal cavity, initial encounter E11.621 - Type 2 diabetes mellitus with foot ulcer E11.51 - Type 2 diabetes mellitus with diabetic peripheral angiopathy without gangrene Procedures Wound #13 Wound #13 is a Diabetic Wound/Ulcer of the Lower Extremity located on the Left Calcaneus . There was a Skin/Subcutaneous Tissue Debridement ([id number]) debridement with total area of 0.01 sq cm performed by Maxwell Caul, MD. with the following instrument(s): Curette to remove Viable and Non-Viable tissue/material including Exudate, Fibrin/Slough, and Subcutaneous after achieving pain control using Lidocaine 4% Topical Solution. A time out was conducted at 08:40, prior to the start of the procedure. A Minimum amount of bleeding was controlled with Pressure. The procedure was tolerated well with a pain  level of 0 throughout and a pain level of 0 following the procedure. Post Debridement Measurements: 1cm length x 0.5cm width x 0.1cm depth; 0.039cm^3 volume. Character of  Wound/Ulcer Post Debridement is stable. Severity of Tissue Post Debridement is: Fat layer exposed. Post procedure Diagnosis Wound #13: Same as Pre-Procedure LADESHA, PACINI ([id number]) Wound #14 Wound #14 is an Open Surgical Wound located on the Right Amputation Site - Digit . There was a Skin/Subcutaneous Tissue Debridement ([id number]) debridement with total area of 0.2 sq cm performed by Maxwell Caul, MD. with the following instrument(s): Curette to remove Viable and Non-Viable tissue/material including Exudate, Fibrin/Slough, and Subcutaneous after achieving pain control using Lidocaine 4% Topical Solution. A time out was conducted at 08:40, prior to the start of the procedure. A Minimum amount of bleeding was controlled with Pressure. The procedure was tolerated well with a pain level of 0 throughout and a pain level of 0 following the procedure. Post Debridement Measurements: 0.5cm length x 0.4cm width x 0.1cm depth; 0.016cm^3 volume. Character of Wound/Ulcer Post Debridement is stable. Severity of Tissue Post Debridement is: Fat layer exposed. Post procedure Diagnosis Wound #14: Same as Pre-Procedure Wound #9 Wound #9 is a Diabetic Wound/Ulcer of the Lower Extremity located on the Right Achilles . There was a Skin/Subcutaneous Tissue Debridement ([id number]) debridement with total area of 38.15 sq cm performed by Maxwell Caul, MD. with the following instrument(s): Curette to remove Viable and Non-Viable tissue/material including Exudate, Fibrin/Slough, and Subcutaneous after achieving pain control using Lidocaine 4% Topical Solution. A time out was conducted at 08:40, prior to the start of the procedure. A Minimum amount of bleeding was controlled with Pressure. The procedure was tolerated well with a pain level of 0 throughout and a pain level of 0 following the procedure. Post Debridement Measurements: 10.9cm length x 3.5cm width x 0.2cm depth; 5.993cm^3  volume. Character of Wound/Ulcer Post Debridement requires further debridement. Severity of Tissue Post Debridement is: Fat layer exposed. Post procedure Diagnosis Wound #9: Same as Pre-Procedure General Notes: tunneling at 12o clock 2.4cm. Plan Wound Cleansing: Clean wound with Normal Saline. Anesthetic: Topical Lidocaine 4% cream applied to wound bed prior to debridement - all wounds for clinic use only Primary Wound Dressing: Wound #11 Midline Abdomen - Lower Quadrant: Prisma Ag - moisten with saline Wound #13 Left Calcaneus: Aquacel Ag - HHRN to please order this for patient [place] Ag - in clinic Wound #14 Right Amputation Site - Digit: Aquacel Ag - HHRN to please order this for patient [place] Ag - in clinic Wound #9 Right Achilles: Aquacel Ag - HHRN to please order this for patient [place]bal[place] Ag - in clinic KATHERLEEN, FOLKES ([id number]) Secondary Dressing: Wound #11 Midline Abdomen - Lower Quadrant: Boardered Foam Dressing Wound #13 Left Calcaneus: Gauze and Kerlix/Conform - tape Foam - heel cup Wound #14 Right Amputation Site - Digit: Boardered Foam Dressing Wound #9 Right Achilles: Conform/Kerlix Foam Dressing Change Frequency: Wound #11 Midline Abdomen - Lower Quadrant: Change Dressing Monday, Wednesday, Friday - or as needed Wound #13 Left Calcaneus: Change Dressing Monday, Wednesday, Friday - or as needed Change Dressing Monday, Wednesday, Friday - or as needed Wound #14 Right Amputation Site - Digit: Change Dressing Monday, Wednesday, Friday - or as needed Follow-up Appointments: Wound #11 Midline Abdomen - Lower Quadrant: Return Appointment in 2 weeks. Wound #13 Left Calcaneus: Return Appointment in 2 weeks. Wound #14 Right Amputation Site - Digit: Return Appointment in 2 weeks. Wound #9 Right Achilles: Return Appointment  in 2 weeks. Off-Loading: Wound #13 Left Calcaneus: Other: - Float heels Wound #9 Right Achilles: Other: - Float heels o #1  her abdominal wounds of just about healed. Still Prisma to the small open area in the middle of the substantial midline abdominal wound this is done very well. Presumably calciphylaxis #2 her right Achilles area is much better than when I last saw this before her hospitalization. She required a fairly aggressive debridement however the tissue appears to be well granulated and healthy. Dimensions AMARACHUKWU, LAKATOS ([id number]) are down versus the last time I saw this. I would continue the Aquacel Ag to this area however in 2 weeks if she still has surface slough and the dimensions of the wound have not changed Iodoflex might be a better choice #3 small wound on the left Achilles also Aquacel Ag #4 fourth toe amputation site still Aquacel Ag Electronic Signature(s) Signed: 03/18/2016 8:01:59 AM By: Baltazar Najjar MD Entered By: Baltazar Najjar on 03/17/2016 09:18:59 [name] ([id number]) -------------------------------------------------------------------------------- SuperBill Details Patient Name: [name] Date of Service: 03/17/2016 Medical Record Patient Account Number: [id number] Number: Treating RN: Phillis Haggis [birth year] (82 y.o. Other Clinician: Date of Birth/Sex: Female) Treating Barbette Mcglaun Primary Care Physician/Extender: Rex Kras, Heidi Physician: Weeks in Treatment: 13 Referring Physician: [name] Diagnosis Coding ICD-10 Codes Code Description Unspecified open wound of abdominal wall, left lower quadrant without penetration into S31.104A peritoneal cavity, initial encounter E11.621 Type 2 diabetes mellitus with foot ulcer E11.51 Type 2 diabetes mellitus with diabetic peripheral angiopathy without gangrene Facility Procedures CPT4 Code: [id number] Description: 11042 - DEB SUBQ TISSUE 20 SQ CM/< ICD-10 Description Diagnosis E11.621 Type 2 diabetes mellitus with foot ulcer Modifier: Quantity: 1 CPT4 Code: [id number] Description:  11045 - DEB SUBQ TISS EA ADDL 20CM ICD-10 Description Diagnosis E11.621 Type 2 diabetes mellitus with foot ulcer Modifier: Quantity: 1 Physician Procedures CPT4 Code: [id number] Description: 11042 - WC PHYS SUBQ TISS 20 SQ CM ICD-10 Description Diagnosis E11.621 Type 2 diabetes mellitus with foot ulcer Modifier: Quantity: 1 CPT4 Code: [id number] Bouchard, Airi Description: 11045 - WC PHYS SUBQ TISS EA ADDL 20 CM ICD-10 Description Diagnosis E11.621 Type 2 diabetes mellitus with foot ulcer Y. ([id number]) Modifier: Quantity: 1 Electronic Signature(s) Signed: 03/18/2016 8:01:59 AM By: Baltazar Najjar MD Entered By: Baltazar Najjar on 03/17/2016 09:19:32

## 2016-03-18 NOTE — Progress Notes (Signed)
[name], [name] ([id number]) Visit Report for 03/17/2016 Arrival Information Details Patient Name: [name], [name]. Date of Service: 03/17/2016 8:00 AM Medical Record Patient Account Number: [id number] Number: Treating RN: Phillis Haggis July 26, 1933 (82 [name] Other Clinician: Date of Birth/Sex: Female) Treating ROBSON, MICHAEL Primary Care Physician: [name] Physician/Extender: G Referring Physician: Charolotte Capuchin in Treatment: 13 Visit Information History Since Last Visit All ordered tests and consults were completed: No Patient Arrived: Wheel Chair Added or deleted any medications: No Arrival Time: 08:09 Any new allergies or adverse reactions: No Accompanied By: daughter Had a fall or experienced change in No Transfer Assistance: EasyPivot Patient activities of daily living that may affect Lift risk of falls: Patient Identification Verified: Yes Signs or symptoms of abuse/neglect since last No Secondary Verification Process Yes visito Completed: Hospitalized since last visit: No Patient Requires Transmission- No Pain Present Now: No Based Precautions: Patient Has Alerts: Yes Patient Alerts: Patient on Blood Thinner DMII Warfarin ABI  Bilateral NO BP RIGHT ARM Electronic Signature(s) Signed: 03/17/2016 5:14:55 PM By: Alejandro Mulling Entered By: Alejandro Mulling on 03/17/2016 08:10:21 [name] ([id number]) -------------------------------------------------------------------------------- Encounter Discharge Information Details Patient Name: [name]. Date of Service: 03/17/2016 8:00 AM Medical Record Patient Account Number: [id number] Number: Treating RN: Phillis Haggis 07/27/1933 (80 [name] Other Clinician: Date of Birth/Sex: Female) Treating ROBSON, MICHAEL Primary Care Physician: [name] Physician/Extender: G Referring Physician: Charolotte Capuchin in Treatment: 13 Encounter Discharge  Information Items Discharge Pain Level: 0 Discharge Condition: Stable Ambulatory Status: Wheelchair Discharge Destination: Nursing Home Transportation: Private Auto Accompanied By: daughter Schedule Follow-up Appointment: Yes Medication Reconciliation completed and provided to Patient/Care Yes Yarelin Reichardt: Provided on Clinical Summary of Care: 03/17/2016 Form Type Recipient Paper Patient [facility] Electronic Signature(s) Signed: 03/17/2016 9:23:47 AM By: Gwenlyn Perking Entered By: Gwenlyn Perking on 03/17/2016 09:23:47 [name] ([id number]) -------------------------------------------------------------------------------- Lower Extremity Assessment Details Patient Name: [name] Date of Service: 03/17/2016 8:00 AM Medical Record Patient Account Number: [id number] Number: Treating RN: Phillis Haggis 19-May-1934 (80 [name] Other Clinician: Date of Birth/Sex: Female) Treating ROBSON, MICHAEL Primary Care Physician: [name] Physician/Extender: G Referring Physician: Charolotte Capuchin in Treatment: 13 Vascular Assessment Pulses: Posterior Tibial Dorsalis Pedis Palpable: [Left:Yes] [Right:No] Doppler: [Right:Monophasic] Extremity colors, hair growth, and conditions: Extremity Color: [Left:Hyperpigmented] [Right:Hyperpigmented] Temperature of Extremity: [Left:Warm] [Right:Warm] Capillary Refill: [Left:< 3 seconds] [Right:< 3 seconds] Toe Nail Assessment Left: Right: Thick: No No Discolored: No No Deformed: No No Improper Length and Hygiene: No No Electronic Signature(s) Signed: 03/17/2016 5:14:55 PM By: Alejandro Mulling Entered By: Alejandro Mulling on 03/17/2016 08:14:26 [name] ([id number]) -------------------------------------------------------------------------------- Multi Wound Chart Details Patient Name: [name] Date of Service: 03/17/2016 8:00 AM Medical Record Patient Account Number:  [id number] Number: Treating RN: Phillis Haggis 07-28-1933 (82 [name] 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] [14:Open Surgical Wound] Secondary Etiology: [11:N/A] [13:N/A] [14:N/A] Comorbid History: [11:Arrhythmia, Congestive Heart Failure, Hypertension, Type II Diabetes] [13:Arrhythmia, Congestive Heart Failure, Hypertension, Type II Diabetes] [14:Arrhythmia, Congestive Heart Failure, Hypertension, Type II Diabetes] Date  Acquired: [11:08/12/2015] [13:12/31/2015] [14:11/11/2015] Weeks of Treatment: [11:13] [13:11] [14:11] Wound Status: [11:Open] [13:Open] [14:Open] Pending Amputation on No [13:No] [14:No] Presentation: Measurements L x W x D 0.2x0.1x0.1 [13:0.1x0.1x0.1] [14:0.5x0.4x0.1] (cm) Area (cm) : [11:0.016] [13:0.008] [14:0.157] Volume (cm) : [11:0.002] [13:0.001] [14:0.016] % Reduction in Area: [11:100.00%] [13:96.90%] [14:79.20%] % Reduction in Volume: 100.00% [13:98.70%] [14:78.70%] Classification: [11:Full Thickness Without Exposed Support Structures] [13:Grade 1] [14:Full Thickness Without Exposed Support Structures] Exudate Amount: [11:Large] [13:None Present] [14:Medium] Exudate Type: [11:Serosanguineous] [13:N/A] [14:Serous] Exudate Color: [11:red, brown] [13:N/A] [14:amber] Foul Odor After No No No Cleansing: Odor  Anticipated Due to N/A N/A N/A Product Use: Wound Margin: Flat and Intact Distinct, outline attached Flat and Intact Granulation Amount: Large (67-100%) None Present (0%) None Present (0%) Granulation Quality: Red N/A N/A Necrotic Amount: None Present (0%) Large (67-100%) Large (67-100%) Necrotic Tissue: N/A Eschar Eschar, Adherent Slough Exposed Structures: Fascia: No Fascia: No Fascia: No Fat: No Fat: No Fat: No Tendon: No Tendon: No Tendon: No Muscle: No Muscle: No Muscle: No Joint: No Joint: No Joint: No Bone: No Bone: No Bone: No Limited to Skin Limited to Skin Limited to Skin Breakdown Breakdown Breakdown Epithelialization: None N/A None Periwound Skin Texture: Edema: No No Abnormalities Noted Edema: No Excoriation: No Excoriation: No Induration: No Induration: No Callus: No Callus: No Crepitus: No Crepitus: No Fluctuance: No Fluctuance: No Friable: No Friable: No Rash: No Rash: No Scarring: No Scarring: No Periwound Skin Moist: Yes Dry/Scaly: Yes Maceration: Yes Moisture: Maceration: No Moist: Yes Dry/Scaly: No Dry/Scaly: No Periwound Skin Color: Erythema: Yes No Abnormalities Noted Atrophie Blanche: No Atrophie Blanche: No Cyanosis: No Cyanosis: No Ecchymosis: No Ecchymosis: No Erythema: No Hemosiderin Staining: No Hemosiderin Staining: No Mottled: No Mottled: No Pallor: No Pallor: No Rubor: No Rubor: No Erythema Location: Circumferential N/A N/A Temperature: No Abnormality N/A No Abnormality Tenderness on Yes No No Palpation: Wound Preparation: Ulcer Cleansing: Ulcer Cleansing: Wound Ulcer Cleansing: Rinsed/Irrigated with Cleanser Rinsed/Irrigated with Saline Saline Topical Anesthetic Topical Anesthetic Applied: Other: lidocaine Topical Anesthetic Applied: Other: 4% Applied: None, Other: lidocaine4% lidocaine 4% [name], [name] ([id number]) Wound Number: 9 N/A N/A Photos: No Photos N/A N/A Wound Location: Right Achilles  N/A N/A Wounding Event: Gradually Appeared N/A N/A 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 N/A N/A Comorbid History: Arrhythmia, Congestive N/A N/A Heart Failure, Hypertension, Type II Diabetes Date Acquired: 06/02/2015 N/A N/A Weeks of Treatment: 13 N/A N/A Wound Status: Open N/A N/A Pending Amputation on Yes N/A N/A Presentation: Measurements L x W x D 10.9x3.5x0.2 N/A N/A (cm) Area (cm) : 29.963 N/A N/A Volume (cm) : 5.993 N/A N/A % Reduction in Area: 8.10% N/A N/A % Reduction in Volume: 38.70% N/A N/A Classification: Grade 2 N/A N/A Exudate Amount: Large N/A N/A Exudate Type: Serosanguineous N/A N/A Exudate Color: red, brown N/A N/A Foul Odor After Yes N/A N/A Cleansing: Odor Anticipated Due to No N/A N/A Product Use: Wound Margin: Thickened N/A N/A Granulation Amount: Large (67-100%) N/A N/A Granulation Quality: Red N/A N/A Necrotic Amount: Small (1-33%) N/A N/A Necrotic Tissue: Eschar, Adherent Slough N/A N/A Exposed Structures: Tendon: Yes N/A N/A Fascia: No Fat: No Muscle: No Joint: No Bone: No Epithelialization: None N/A N/A Periwound Skin Texture: Edema: No N/A N/A Excoriation: No Induration: No Callus: No Crepitus: No Fluctuance: No Friable: No [name], [name] ([id number]) Rash: No Scarring: No Periwound Skin Maceration: Yes N/A N/A Moisture: Moist: Yes Dry/Scaly: No Periwound Skin Color: Erythema: Yes N/A  N/A Atrophie Blanche: No Cyanosis: No Ecchymosis: No Hemosiderin Staining: No Mottled: No Pallor: No Rubor: No Erythema Location: Circumferential N/A N/A Temperature: N/A N/A N/A Tenderness on Yes N/A N/A Palpation: Wound Preparation: Ulcer Cleansing: N/A N/A Rinsed/Irrigated with Saline Topical Anesthetic Applied: Other: lidocaine 4% Treatment Notes Electronic Signature(s) Signed: 03/17/2016 5:14:55 PM By: Alejandro Mulling Entered By: Alejandro Mulling on 03/17/2016 08:33:10 [name] ([id number]) -------------------------------------------------------------------------------- Multi-Disciplinary Care Plan Details Patient Name: RON, JUNCO. Date of Service: 03/17/2016 8:00 AM Medical Record Patient Account Number: [id number] Number: Treating RN: Phillis Haggis 1933/06/23 (82 [name] Other Clinician: Date of Birth/Sex: Female) Treating ROBSON, MICHAEL Primary Care Physician: [name] Physician/Extender: G Referring Physician: Charolotte Capuchin in Treatment: 80 Active Inactive Abuse / Safety / Falls / Self Care Management Nursing Diagnoses: Impaired physical mobility Potential for falls Goals: Patient will remain injury free Date Initiated: 12/17/2015 Goal Status: Active Interventions: Assess fall risk on admission and as needed Notes: Necrotic Tissue Nursing Diagnoses: Impaired tissue integrity related to necrotic/devitalized tissue Knowledge deficit related to management of necrotic/devitalized tissue Goals: Necrotic/devitalized tissue will be minimized in the wound bed Date Initiated: 02/04/2016 Goal Status: Active Interventions: Assess patient pain level pre-, during and post procedure and prior to discharge Provide education on necrotic tissue and debridement process Treatment Activities: Apply topical anesthetic as ordered : 02/04/2016 Notes: [name], [name] ([id number]) Wound/Skin Impairment Nursing Diagnoses: Impaired tissue integrity Goals: Patient/caregiver will verbalize understanding of skin care regimen Date Initiated: 12/17/2015 Goal Status: Active Ulcer/skin breakdown will have a volume reduction of 30% by week 4 Date Initiated: 12/17/2015 Goal Status: Active Ulcer/skin breakdown will have a volume reduction of 50% by week 8 Date Initiated: 12/17/2015 Goal Status: Active Ulcer/skin breakdown will have a volume reduction of 80% by week 12 Date Initiated: 12/17/2015 Goal  Status: Active Ulcer/skin breakdown will heal within 14 weeks Date Initiated: 12/17/2015 Goal Status: Active Interventions: Assess patient/caregiver ability to obtain necessary supplies Assess patient/caregiver ability to perform ulcer/skin care regimen upon admission and as needed Assess ulceration(s) every visit Notes: Electronic Signature(s) Signed: 03/17/2016 5:14:55 PM By: Alejandro Mulling Entered By: Alejandro Mulling on 03/17/2016 08:33:04 [name] ([id number]) -------------------------------------------------------------------------------- Pain Assessment Details Patient Name: [name] Date of Service: 03/17/2016 8:00 AM Medical Record Patient Account Number: [id number] Number: Treating RN: Phillis Haggis 07-13-1933 (80 [name] Other Clinician: Date of Birth/Sex: Female) Treating ROBSON, MICHAEL Primary Care Physician: [name] Physician/Extender: G Referring Physician: Charolotte Capuchin in Treatment: 13 Active Problems Location of Pain Severity and Description of Pain Patient Has Paino No Site Locations With Dressing Change: No Pain Management and Medication Current Pain Management: Electronic Signature(s) Signed: 03/17/2016 5:14:55 PM By: Alejandro Mulling Entered By: Alejandro Mulling on 03/17/2016 08:10:28 [name] ([id number]) -------------------------------------------------------------------------------- Patient/Caregiver Education Details Patient Name: [name]. Date of Service: 03/17/2016 8:00 AM Medical Record Patient Account Number: [id number] Number: Treating RN: Phillis Haggis 1933/09/03 (82 [name] Other Clinician: Date of Birth/Gender: Female) Treating ROBSON, MICHAEL Primary Care Physician: [name] Physician/Extender: G Referring Physician: Charolotte Capuchin in Treatment: 13 Education Assessment Education Provided To: Patient Education Topics Provided Wound/Skin  Impairment: Handouts: Other: change dressing as ordered Methods: Demonstration, Explain/Verbal Responses: State content correctly Electronic Signature(s) Signed: 03/17/2016 5:14:55 PM By: Alejandro Mulling Entered By: Alejandro Mulling on 03/17/2016 08:35:18 [name] ([id number]) -------------------------------------------------------------------------------- Wound Assessment Details Patient Name: [name] Date of Service: 03/17/2016 8:00 AM Medical Record Patient Account Number: [id number] Number: Treating RN: Phillis Haggis  01-25-1934 (82 [name] 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 Etiology: Wound Location: Abdomen - Lower Quadrant - Midline Wound Open Status: Wounding Event: Trauma Comorbid Arrhythmia, Congestive Heart Failure, Date Acquired: 08/12/2015 History: Hypertension, Type II Diabetes Weeks Of Treatment: 13 Clustered Wound: No Photos Photo Uploaded By: Alejandro Mulling on 03/17/2016 08:36:50 Wound Measurements Length: (cm) 0.2 Width: (cm) 0.1 Depth: (cm) 0.1 Area: (cm) 0.016 Volume: (cm) 0.002 % Reduction in Area: 100% % Reduction in Volume: 100% Epithelialization: None Tunneling: No Undermining: No Wound Description Full Thickness Without Exposed Classification: Support Structures Wound Margin: Flat and Intact Exudate Large Amount: Exudate Type: Serosanguineous Exudate Color: red, brown Foul Odor After Cleansing: No Wound Bed [name], [name] ([id number]) Granulation Amount: Large (67-100%) Exposed Structure Granulation Quality: Red Fascia Exposed: No Necrotic Amount: None Present (0%) Fat Layer Exposed: No Tendon Exposed: No Muscle Exposed: No Joint Exposed: No Bone Exposed: No Limited to Skin  Breakdown Periwound Skin Texture Texture Color No Abnormalities Noted: No No Abnormalities Noted: No Callus: No Atrophie Blanche: No Crepitus: No Cyanosis: No Excoriation: No Ecchymosis: No Fluctuance: No Erythema: Yes Friable: No Erythema Location: Circumferential Induration: No Hemosiderin Staining: No Localized Edema: No Mottled: No Rash: No Pallor: No Scarring: No Rubor: No Moisture Temperature / Pain No Abnormalities Noted: No Temperature: No Abnormality Dry / Scaly: No Tenderness on Palpation: Yes Maceration: No Moist: Yes Wound Preparation Ulcer Cleansing: Rinsed/Irrigated with Saline Topical Anesthetic Applied: Other: lidocaine4%, Treatment Notes Wound #11 (Midline Abdomen - Lower Quadrant) 1. Cleansed with: Clean wound with Normal Saline 2. Anesthetic Topical Lidocaine 4% cream to wound bed prior to debridement 5. Secondary Dressing Applied Bordered Foam Dressing Notes silver alginate Electronic Signature(s) Signed: 03/17/2016 5:14:55 PM By: Alejandro Mulling Entered By: Alejandro Mulling on 03/17/2016 08:32:52 [name], [name] ([id number]) [name], [name] ([id number]) -------------------------------------------------------------------------------- Wound Assessment Details Patient Name: [name] Date of Service: 03/17/2016 8:00 AM Medical Record Patient Account Number: [id number] Number: Treating RN: Phillis Haggis November 29, 1933 (82 [name] Other Clinician: Date of Birth/Sex: Female) Treating ROBSON, MICHAEL Primary Care Physician: [name] Physician/Extender: G Referring Physician: Charolotte Capuchin in Treatment: 13 Wound Status Wound Number: 13 Primary Diabetic Wound/Ulcer of the Lower Etiology: Extremity Wound Location: Left Calcaneus Wound Open Wounding Event: Gradually Appeared Status: Date Acquired: 12/31/2015 Comorbid Arrhythmia, Congestive Heart Failure, Weeks Of Treatment: 11 History: Hypertension, Type II  Diabetes Clustered Wound: No Photos Photo Uploaded By: Alejandro Mulling on 03/17/2016 08:36:50 Wound Measurements Length: (cm) 0.1 Width: (cm) 0.1 Depth: (cm) 0.1 Area: (cm) 0.008 Volume: (cm) 0.001 % Reduction in Area: 96.9% % Reduction in Volume: 98.7% Tunneling: No Undermining: No Wound Description Classification: Grade 1 Wound Margin: Distinct, outline attached Exudate Amount: None Present Foul Odor After Cleansing: No Wound Bed Granulation Amount: None Present (0%) Exposed Structure Necrotic Amount: Large (67-100%) Fascia Exposed: No Necrotic Quality: Eschar Fat Layer Exposed: No Tendon Exposed: No Muscle Exposed: No [name], [name] ([id number]) Joint Exposed: No Bone Exposed: No Limited to Skin Breakdown Periwound Skin Texture Texture Color No Abnormalities Noted: No No Abnormalities Noted: No Moisture No Abnormalities Noted: No Dry / Scaly: Yes Wound Preparation Ulcer Cleansing: Wound Cleanser Topical Anesthetic Applied: Other: lidocaine 4%, Treatment Notes Wound #13 (Left Calcaneus) 1. Cleansed with: Clean wound with Normal Saline 2. Anesthetic Topical Lidocaine 4% cream to wound bed prior to debridement 5. Secondary Dressing Applied  Dry Gauze Kerlix/Conform 7. Secured with Tape Notes silver alginate, heel cup Electronic Signature(s) Signed: 03/17/2016 5:14:55 PM By: Alejandro MullingPinkerton, Debra Entered By: Alejandro MullingPinkerton, Debra on 03/17/2016 08:27:46 Natalie RimaHOPKINS, Natalie Y. ([id number]) -------------------------------------------------------------------------------- Wound Assessment Details Patient Name: Natalie RimaHOPKINS, Tirsa Y. Date of Service: 03/17/2016 8:00 AM Medical Record Patient Account Number: [id number] Number: Treating RN: Phillis Haggisinkerton, Debi 1934/05/01 (82 [name] Other Clinician: Date of Birth/Sex: Female) Treating ROBSON, MICHAEL Primary Care Physician: Rolm GalaGrandis, Heidi Physician/Extender: G Referring Physician: Charolotte CapuchinGrandis, Heidi Weeks in Treatment:  13 Wound Status Wound Number: 14 Primary Open Surgical Wound Etiology: Wound Location: Right Amputation Site - Digit Wound Open Wounding Event: Surgical Injury Status: Date Acquired: 11/11/2015 Comorbid Arrhythmia, Congestive Heart Failure, Weeks Of Treatment: 11 History: Hypertension, Type II Diabetes Clustered Wound: No Photos Photo Uploaded By: Alejandro MullingPinkerton, Debra on 03/17/2016 08:37:18 Wound Measurements Length: (cm) 0.5 Width: (cm) 0.4 Depth: (cm) 0.1 Area: (cm) 0.157 Volume: (cm) 0.016 % Reduction in Area: 79.2% % Reduction in Volume: 78.7% Epithelialization: None Tunneling: No Wound Description Full Thickness Without Exposed Classification: Support Structures Wound Margin: Flat and Intact Exudate Medium Amount: Exudate Type: Serous Exudate Color: amber Foul Odor After Cleansing: No Wound Bed Granulation Amount: None Present (0%) Exposed Structure Natalie RimaHOPKINS, Paysen Y. ([id number]) Necrotic Amount: Large (67-100%) Fascia Exposed: No Necrotic Quality: Eschar, Adherent Slough Fat Layer Exposed: No Tendon Exposed: No Muscle Exposed: No Joint Exposed: No Bone Exposed: No Limited to Skin Breakdown Periwound Skin Texture Texture Color No Abnormalities Noted: No No Abnormalities Noted: No Callus: No Atrophie Blanche: No Crepitus: No Cyanosis: No Excoriation: No Ecchymosis: No Fluctuance: No Erythema: No Friable: No Hemosiderin Staining: No Induration: No Mottled: No Localized Edema: No Pallor: No Rash: No Rubor: No Scarring: No Temperature / Pain Moisture Temperature: No Abnormality No Abnormalities Noted: No Dry / Scaly: No Maceration: Yes Moist: Yes Wound Preparation Ulcer Cleansing: Rinsed/Irrigated with Saline Topical Anesthetic Applied: None, Other: lidocaine 4%, Treatment Notes Wound #14 (Right Amputation Site - Digit) 1. Cleansed with: Clean wound with Normal Saline 2. Anesthetic Topical Lidocaine 4% cream to wound bed prior to  debridement 5. Secondary Dressing Applied Bordered Foam Dressing Notes silver alginate Electronic Signature(s) Signed: 03/17/2016 5:14:55 PM By: Alejandro MullingPinkerton, Debra Entered By: Alejandro MullingPinkerton, Debra on 03/17/2016 08:25:01 Natalie RimaHOPKINS, Nikisha Y. ([id number]) -------------------------------------------------------------------------------- Wound Assessment Details Patient Name: Natalie RimaHOPKINS, Naturi Y. Date of Service: 03/17/2016 8:00 AM Medical Record Patient Account Number: [id number] Number: Treating RN: Phillis Haggisinkerton, Debi 1934/05/01 (82 [name] Other Clinician: Date of Birth/Sex: Female) Treating ROBSON, MICHAEL Primary Care Physician: Rolm GalaGrandis, Heidi Physician/Extender: G Referring Physician: Charolotte CapuchinGrandis, Heidi Weeks in Treatment: 13 Wound Status Wound Number: 9 Primary Diabetic Wound/Ulcer of the Lower Etiology: Extremity Wound Location: Right Achilles Secondary Arterial Insufficiency Ulcer Wounding Event: Gradually Appeared Etiology: Date Acquired: 06/02/2015 Wound Open Weeks Of Treatment: 13 Status: Clustered Wound: No Comorbid Arrhythmia, Congestive Heart Pending Amputation On Presentation History: Failure, Hypertension, Type II Diabetes Photos Photo Uploaded By: Alejandro MullingPinkerton, Debra on 03/17/2016 08:37:19 Wound Measurements Length: (cm) 10.9 Width: (cm) 3.5 Depth: (cm) 0.2 Area: (cm) 29.963 Volume: (cm) 5.993 % Reduction in Area: 8.1% % Reduction in Volume: 38.7% Epithelialization: None Tunneling: No Undermining: No Wound Description Classification: Grade 2 Foul Odor Afte Wound Margin: Thickened Due to Product Exudate Amount: Large Exudate Type: Serosanguineous Exudate Color: red, brown r Cleansing: Yes Use: No Wound Bed Natalie RimaHOPKINS, Cleopha Y. ([id number]) Granulation Amount: Large (67-100%) Exposed Structure Granulation Quality: Red Fascia Exposed: No Necrotic Amount: Small (1-33%) Fat Layer Exposed: No Necrotic Quality: Eschar, Adherent Slough Tendon Exposed: Yes Muscle  Exposed: No Joint Exposed: No Bone Exposed: No Periwound Skin Texture Texture Color No Abnormalities Noted: No No Abnormalities Noted: No Callus: No Atrophie Blanche: No Crepitus: No Cyanosis: No Excoriation: No Ecchymosis: No Fluctuance: No Erythema: Yes Friable: No Erythema Location: Circumferential Induration: No Hemosiderin Staining: No Localized Edema: No Mottled: No Rash: No Pallor: No Scarring: No Rubor: No Moisture Temperature / Pain No Abnormalities Noted: No Tenderness on Palpation: Yes Dry / Scaly: No Maceration: Yes Moist: Yes Wound Preparation Ulcer Cleansing: Rinsed/Irrigated with Saline Topical Anesthetic Applied: Other: lidocaine 4%, Treatment Notes Wound #9 (Right Achilles) 1. Cleansed with: Clean wound with Normal Saline 2. Anesthetic Topical Lidocaine 4% cream to wound bed prior to debridement 5. Secondary Dressing Applied Foam Kerlix/Conform 7. Secured with Tape Notes sorbalgon, silver alginate rope, coban Electronic Signature(s) Signed: 03/17/2016 5:14:55 PM By: Adair Laundry ([id number]) Entered By: Alejandro Mulling on 03/17/2016 08:25:19 [name] ([id number]) -------------------------------------------------------------------------------- Vitals Details Patient Name: [name] Date of Service: 03/17/2016 8:00 AM Medical Record Patient Account Number: [id number] Number: Treating RN: Phillis Haggis [birth year] (82 [name] Other Clinician: Date of Birth/Sex: Female) Treating ROBSON, MICHAEL Primary Care Physician: [name] Physician/Extender: G Referring Physician: Charolotte Capuchin in Treatment: 13 Vital Signs Time Taken: 08:11 Temperature (F): 97.6 Height (in): 65 Pulse (bpm): 81 Weight (lbs): 160 Respiratory Rate (breaths/min): 18 Body Mass Index (BMI): 26.6 Blood Pressure (mmHg): 105/53 Reference Range: 80 - 120 mg / dl Notes Made Dr. Sedonia Small aware of  BP. Electronic Signature(s) Signed: 03/17/2016 5:14:55 PM By: Alejandro Mulling Entered By: Alejandro Mulling on 03/17/2016 08:13:06

## 2016-03-31 ENCOUNTER — Encounter: Payer: Medicare Other | Admitting: Physician Assistant

## 2016-03-31 DIAGNOSIS — E11621 Type 2 diabetes mellitus with foot ulcer: Secondary | ICD-10-CM | POA: Diagnosis not present

## 2016-04-01 NOTE — Progress Notes (Signed)
[name] ([id number]) Visit Report for 03/31/2016 Arrival Information Details Patient Name: [name], [name]. Date of Service: 03/31/2016 3:00 PM Medical Record Number: [id number] Patient Account Number: [id number] Date of Birth/Sex: [birth year] (82 [name] Female) Treating RN: Ashok Cordia, Debi Primary Care Physician: [name] Other Clinician: Referring Physician: [name] Treating Physician/Extender: Linwood Dibbles, HOYT Weeks in Treatment: 15 Visit Information History Since Last Visit All ordered tests and consults were completed: No Patient Arrived: Wheel Chair Added or deleted any medications: No Arrival Time: 15:17 Any new allergies or adverse reactions: No Accompanied By: daughter Had a fall or experienced change in No Transfer Assistance: EasyPivot Patient activities of daily living that may affect Lift risk of falls: Patient Identification Verified: Yes Signs or symptoms of abuse/neglect since last No Secondary Verification Process Yes visito Completed: Hospitalized since last visit: No Patient Requires Transmission- No Pain Present Now: No Based Precautions: Patient Has Alerts: Yes Patient Alerts: Patient on Blood Thinner DMII Warfarin ABI  Bilateral NO BP RIGHT ARM Electronic Signature(s) Signed: 03/31/2016 5:06:01 PM By: Alejandro Mulling Entered By: Alejandro Mulling on 03/31/2016 15:17:27 [name] ([id number]) -------------------------------------------------------------------------------- Complex / Palliative Patient Assessment Details Patient Name: [name] Date of Service: 03/31/2016 3:00 PM Medical Record Number: [id number] Patient Account Number: [id number] Date of Birth/Sex: 1934-03-31 (82 [name] Female) Treating RN: Huel Coventry Primary Care Physician: [name] Other Clinician: Referring Physician: [name] Treating Physician/Extender: Linwood Dibbles, HOYT Weeks in Treatment: 15 Palliative Management  Criteria Complex Wound Management Criteria Patient has remarkable or complex co-morbidities requiring medications or treatments that extend wound healing times. Examples: o Diabetes mellitus with chronic renal failure or end stage renal disease requiring dialysis o Advanced or poorly controlled rheumatoid arthritis o Diabetes mellitus and end stage chronic obstructive pulmonary disease o Active cancer with current chemo- or radiation therapy DM, ESRD Care Approach Wound Care Plan: Complex Wound Management Electronic Signature(s) Signed: 04/01/2016 3:14:51 PM By: Elliot Gurney, RN, BSN, Kim RN, BSN Entered By: Elliot Gurney, RN, BSN, Kim on 04/01/2016 15:14:51 [name] ([id number]) -------------------------------------------------------------------------------- Encounter Discharge Information Details Patient Name: [name], [name]. Date of Service: 03/31/2016 3:00 PM Medical Record Number: [id number] Patient Account Number: [id number] Date of Birth/Sex: [birth year] (82 [name] Female) Treating RN: Ashok Cordia, Debi Primary Care Physician: [name] Other Clinician: Referring Physician: [name] Treating Physician/Extender: Linwood Dibbles, HOYT Weeks in Treatment: 15 Encounter Discharge Information Items Discharge Pain Level: 0 Discharge Condition: Stable Ambulatory Status: Wheelchair Discharge Destination: Nursing Home Transportation: Other Accompanied By: daughter Schedule Follow-up Appointment: Yes Medication Reconciliation completed and provided to Patient/Care Yes [name]: Provided on Clinical Summary of Care: 03/31/2016 Form Type Recipient Paper Patient [facility] Electronic Signature(s) Signed: 03/31/2016 4:36:51 PM By: Gwenlyn Perking Entered By: Gwenlyn Perking on 03/31/2016 16:36:51 [name] ([id number]) -------------------------------------------------------------------------------- Lower Extremity Assessment Details Patient Name: [name] Date of  Service: 03/31/2016 3:00 PM Medical Record Number: [id number] Patient Account Number: [id number] Date of Birth/Sex: 1933/10/04 (82 [name] Female) Treating RN: Ashok Cordia, Debi Primary Care Physician: [name] Other Clinician: Referring Physician: [name] Treating Physician/Extender: Linwood Dibbles, HOYT Weeks in Treatment: 15 Vascular Assessment Pulses: Posterior Tibial Dorsalis Pedis Palpable: [Left:Yes] [Right:No] Doppler: [Right:Monophasic] Extremity colors, hair growth, and conditions: Extremity Color: [Left:Hyperpigmented] [Right:Hyperpigmented] Temperature of Extremity: [Left:Warm] [Right:Warm] Capillary Refill: [Left:< 3 seconds] [Right:< 3 seconds] Toe Nail Assessment Left: Right: Thick: No No Discolored: No No Deformed: No No Improper Length and Hygiene: No No Electronic Signature(s) Signed: 03/31/2016 5:06:01 PM By: Alejandro Mulling Entered By: Alejandro Mulling on 03/31/2016  15:21:24 [name], [name] ([id number]) -------------------------------------------------------------------------------- Multi Wound Chart Details Patient Name: [name]. Date of Service: 03/31/2016 3:00 PM Medical Record Number: [id number] Patient Account Number: [id number] Date of Birth/Sex: 08-Oct-1933 (82 [name] Female) Treating RN: Ashok Cordia, Debi Primary Care Physician: [name] Other Clinician: Referring Physician: [name] Treating Physician/Extender: Linwood Dibbles, HOYT Weeks in Treatment: 15 Vital Signs Height(in): 65 Pulse(bpm): 79 Weight(lbs): 160 Blood Pressure 130/56 (mmHg): Body Mass Index(BMI): 27 Temperature(F): 97.9 Respiratory Rate 18 (breaths/min): Photos: [11:No Photos] [13:No Photos] [14:No Photos] Wound Location: [11:Abdomen - Lower Quadrant - Midline] [13:Left Calcaneus] [14:Right Amputation Site - Digit] Wounding Event: [11:Trauma] [13:Gradually Appeared] [14:Surgical Injury] 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] [14:Open Surgical Wound] Secondary Etiology: [11:N/A] [13:N/A] [14:N/A] Comorbid History: [11:Arrhythmia, Congestive Heart Failure, Hypertension, Type II Diabetes] [13:Arrhythmia, Congestive Heart Failure, Hypertension, Type II Diabetes] [14:Arrhythmia, Congestive Heart Failure, Hypertension, Type II Diabetes] Date Acquired: [11:08/12/2015] [13:12/31/2015] [14:11/11/2015] Weeks of Treatment: [11:15] [13:13] [14:13] Wound Status: [11:Open] [13:Open] [14:Open] Pending Amputation on No [13:No] [14:No] Presentation: Measurements L x W x D 0.2x0.1x0.1 [13:0.1x0.1x0.1] [14:0.2x0.2x0.1] (cm) Area (cm) : [11:0.016] [13:0.008] [14:0.031] Volume (cm) : [11:0.002] [13:0.001] [14:0.003] % Reduction in Area: [11:100.00%] [13:96.90%] [14:95.90%] % Reduction in Volume: 100.00% [13:98.70%] [14:96.00%] Tunneling: [11:No] [13:No] [14:No] Classification: [11:Full Thickness Without Exposed Support Structures] [13:Grade 1] [14:Full Thickness Without Exposed Support Structures] Exudate Amount: [11:Medium] [13:None Present] [14:Medium] Exudate Type: [11:Serosanguineous] [13:N/A] [14:Serous] Exudate Color: [11:red, brown No] [13:N/A No] [14:amber No] Foul Odor After Cleansing: Odor Anticipated Due to N/A N/A N/A Product Use: Wound Margin: Flat and Intact Distinct, outline attached Flat and Intact Granulation Amount: None Present (0%) None Present (0%) None Present (0%) Granulation Quality: N/A N/A N/A Necrotic Amount: Large (67-100%) Large (67-100%) Large (67-100%) Necrotic Tissue: Eschar, Adherent Slough Eschar Eschar, Adherent Slough Exposed Structures: Fascia: No Fascia: No Fascia: No Fat: No Fat: No Fat: No Tendon: No Tendon: No Tendon: No Muscle: No Muscle: No Muscle: No Joint: No Joint: No Joint: No Bone: No Bone: No Bone: No Limited to Skin Limited to Skin Limited to Skin Breakdown Breakdown Breakdown Epithelialization: None None None Periwound Skin Texture:  Edema: No No Abnormalities Noted Edema: No Excoriation: No Excoriation: No Induration: No Induration: No Callus: No Callus: No Crepitus: No Crepitus: No Fluctuance: No Fluctuance: No Friable: No Friable: No Rash: No Rash: No Scarring: No Scarring: No Periwound Skin Moist: Yes Dry/Scaly: Yes Maceration: Yes Moisture: Maceration: No Moist: Yes Dry/Scaly: No Dry/Scaly: No Periwound Skin Color: Erythema: Yes No Abnormalities Noted Atrophie Blanche: No Atrophie Blanche: No Cyanosis: No Cyanosis: No Ecchymosis: No Ecchymosis: No Erythema: No Hemosiderin Staining: No Hemosiderin Staining: No Mottled: No Mottled: No Pallor: No Pallor: No Rubor: No Rubor: No Erythema Location: Circumferential N/A N/A Temperature: No Abnormality N/A No Abnormality Tenderness on Yes No No Palpation: Wound Preparation: Ulcer Cleansing: Ulcer Cleansing: Wound Ulcer Cleansing: Rinsed/Irrigated with Cleanser Rinsed/Irrigated with Saline Saline Topical Anesthetic Topical Anesthetic Applied: Other: lidocaine Topical Anesthetic Applied: Other: 4% Applied: None, Other: lidocaine4% lidocaine 4% [name], [name] ([id number]) Wound Number: 9 N/A N/A Photos: No Photos N/A N/A Wound Location: Right Achilles N/A N/A Wounding Event: Gradually Appeared N/A N/A 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 N/A N/A Comorbid History: Arrhythmia, Congestive N/A N/A Heart Failure, Hypertension, Type II Diabetes Date Acquired: 06/02/2015 N/A N/A Weeks of Treatment: 15 N/A N/A Wound Status: Open N/A N/A Pending Amputation on Yes N/A N/A Presentation: Measurements L x W x  D 10.2x3x0.2 N/A N/A (cm) Area (cm) : 24.033 N/A N/A Volume (cm) : 4.807 N/A N/A % Reduction in Area: 26.30% N/A N/A % Reduction in Volume: 50.80% N/A N/A Position 1 (o'clock): 12 Maximum Distance 1 2.6 (cm): Tunneling: Yes N/A  N/A Classification: Grade 2 N/A N/A Exudate Amount: Large N/A N/A Exudate Type: Serosanguineous N/A N/A Exudate Color: red, brown N/A N/A Foul Odor After Yes N/A N/A Cleansing: Odor Anticipated Due to No N/A N/A Product Use: Wound Margin: Thickened N/A N/A Granulation Amount: Medium (34-66%) N/A N/A Granulation Quality: Red N/A N/A Necrotic Amount: Medium (34-66%) N/A N/A Necrotic Tissue: Eschar, Adherent Slough N/A N/A Exposed Structures: Tendon: Yes N/A N/A Fascia: No Fat: No Muscle: No Joint: No Bone: No Epithelialization: None N/A N/A Periwound Skin Texture: Edema: No N/A N/A Excoriation: No Induration: No [name], [name] ([id number]) Callus: No Crepitus: No Fluctuance: No Friable: No Rash: No Scarring: No Periwound Skin Maceration: Yes N/A N/A Moisture: Moist: Yes Dry/Scaly: No Periwound Skin Color: Erythema: Yes N/A N/A Atrophie Blanche: No Cyanosis: No Ecchymosis: No Hemosiderin Staining: No Mottled: No Pallor: No Rubor: No Erythema Location: Circumferential N/A N/A Temperature: N/A N/A N/A Tenderness on Yes N/A N/A Palpation: Wound Preparation: Ulcer Cleansing: N/A N/A Rinsed/Irrigated with Saline Topical Anesthetic Applied: Other: lidocaine 4% Treatment Notes Electronic Signature(s) Signed: 03/31/2016 5:06:01 PM By: Alejandro Mulling Entered By: Alejandro Mulling on 03/31/2016 15:34:30 [name] ([id number]) -------------------------------------------------------------------------------- Multi-Disciplinary Care Plan Details Patient Name: [name] Date of Service: 03/31/2016 3:00 PM Medical Record Number: [id number] Patient Account Number: [id number] Date of Birth/Sex: [birth year] (82 [name] Female) Treating RN: Ashok Cordia, Debi Primary Care Physician: [name] Other Clinician: Referring Physician: [name] Treating Physician/Extender: Linwood Dibbles, HOYT Weeks in Treatment: 15 Active Inactive Abuse / Safety / Falls /  Self Care Management Nursing Diagnoses: Impaired physical mobility Potential for falls Goals: Patient will remain injury free Date Initiated: 12/17/2015 Goal Status: Active Interventions: Assess fall risk on admission and as needed Notes: Necrotic Tissue Nursing Diagnoses: Impaired tissue integrity related to necrotic/devitalized tissue Knowledge deficit related to management of necrotic/devitalized tissue Goals: Necrotic/devitalized tissue will be minimized in the wound bed Date Initiated: 02/04/2016 Goal Status: Active Interventions: Assess patient pain level pre-, during and post procedure and prior to discharge Provide education on necrotic tissue and debridement process Treatment Activities: Apply topical anesthetic as ordered : 02/04/2016 Notes: Wound/Skin Impairment [name], [name] ([id number]) Nursing Diagnoses: Impaired tissue integrity Goals: Patient/caregiver will verbalize understanding of skin care regimen Date Initiated: 12/17/2015 Goal Status: Active Ulcer/skin breakdown will have a volume reduction of 30% by week 4 Date Initiated: 12/17/2015 Goal Status: Active Ulcer/skin breakdown will have a volume reduction of 50% by week 8 Date Initiated: 12/17/2015 Goal Status: Active Ulcer/skin breakdown will have a volume reduction of 80% by week 12 Date Initiated: 12/17/2015 Goal Status: Active Ulcer/skin breakdown will heal within 14 weeks Date Initiated: 12/17/2015 Goal Status: Active Interventions: Assess patient/caregiver ability to obtain necessary supplies Assess patient/caregiver ability to perform ulcer/skin care regimen upon admission and as needed Assess ulceration(s) every visit Notes: Electronic Signature(s) Signed: 03/31/2016 5:06:01 PM By: Alejandro Mulling Entered By: Alejandro Mulling on 03/31/2016 15:34:24 [name] ([id number]) -------------------------------------------------------------------------------- Pain Assessment Details Patient  Name: [name] Date of Service: 03/31/2016 3:00 PM Medical Record Number: [id number] Patient Account Number: [id number] Date of Birth/Sex: 24-Sep-1933 (82 [name] Female) Treating RN: Phillis Haggis Primary Care Physician: [name] Other Clinician: Referring Physician: [name] Treating Physician/Extender: STONE III, HOYT Weeks in  Treatment: 15 Active Problems Location of Pain Severity and Description of Pain Patient Has Paino No Site Locations With Dressing Change: No Pain Management and Medication Current Pain Management: Electronic Signature(s) Signed: 03/31/2016 5:06:01 PM By: Alejandro Mulling Entered By: Alejandro Mulling on 03/31/2016 15:19:41 [name] ([id number]) -------------------------------------------------------------------------------- Patient/Caregiver Education Details Patient Name: [name] Date of Service: 03/31/2016 3:00 PM Medical Record Number: [id number] Patient Account Number: [id number] Date of Birth/Gender: [birth year] (82 [name] Female) Treating RN: Ashok Cordia, Debi Primary Care Physician: [name] Other Clinician: Referring Physician: [name] Treating Physician/Extender: Skeet Simmer in Treatment: 15 Education Assessment Education Provided To: Patient Education Topics Provided Wound/Skin Impairment: Handouts: Other: change dressing as ordered Methods: Demonstration, Explain/Verbal Responses: State content correctly Electronic Signature(s) Signed: 03/31/2016 5:06:01 PM By: Alejandro Mulling Entered By: Alejandro Mulling on 03/31/2016 16:16:54 [name] ([id number]) -------------------------------------------------------------------------------- Wound Assessment Details Patient Name: [name] Date of Service: 03/31/2016 3:00 PM Medical Record Number: [id number] Patient Account Number: [id number] Date of Birth/Sex: 1933/12/25 (82 [name] 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 Etiology: Wound Location: Midline Abdomen - Lower Quadrant Wound Open Status: Wounding Event: Trauma Comorbid Arrhythmia, Congestive Heart Failure, Date Acquired: 08/12/2015 History: Hypertension, Type II Diabetes Weeks Of Treatment: 15 Clustered Wound: No Photos Photo Uploaded By: Alejandro Mulling on 03/31/2016 16:54:46 Wound Measurements Length: (cm) 0.2 Width: (cm) 0.3 Depth: (cm) 0.1 Area: (cm) 0.047 Volume: (cm) 0.005 % Reduction in Area: 99.9% % Reduction in Volume: 100% Epithelialization: None Tunneling: No Undermining: No Wound Description Full Thickness Without Exposed Classification: Support Structures Wound Margin: Flat and Intact Exudate Medium Amount: Exudate Type: Serosanguineous Exudate Color: red, brown Foul Odor After Cleansing: No Wound Bed Granulation Amount: None Present (0%) Exposed Structure Necrotic Amount: Large (67-100%) Fascia Exposed: No AZAYLEA, MAVES ([id number]) Necrotic Quality: Eschar, Adherent Slough Fat Layer Exposed: No Tendon Exposed: No Muscle Exposed: No Joint Exposed: No Bone Exposed: No Limited to Skin Breakdown Periwound Skin Texture Texture Color No Abnormalities Noted: No No Abnormalities Noted: No Callus: No Atrophie Blanche: No Crepitus: No Cyanosis: No Excoriation: No Ecchymosis: No Fluctuance: No Erythema: Yes Friable: No Erythema Location: Circumferential Induration: No Hemosiderin Staining: No Localized Edema: No Mottled: No Rash: No Pallor: No Scarring: No Rubor: No Moisture Temperature / Pain No Abnormalities Noted: No Temperature: No Abnormality Dry / Scaly: No Tenderness on Palpation: Yes Maceration: No Moist: Yes Wound Preparation Ulcer Cleansing: Rinsed/Irrigated with  Saline Topical Anesthetic Applied: Other: lidocaine4%, Treatment Notes Wound #11 (Midline Abdomen - Lower Quadrant) 1. Cleansed with: Clean wound with Normal Saline 2. Anesthetic Topical Lidocaine 4% cream to wound bed prior to debridement 4. Dressing Applied: Other dressing (specify in notes) 5. Secondary Dressing Applied Bordered Foam Dressing Notes Polymem Silver Electronic Signature(s) Signed: 03/31/2016 5:06:01 PM By: Alejandro Mulling Entered By: Alejandro Mulling on 03/31/2016 15:53:31 [name], [name] ([id number]) [name], [name] ([id number]) -------------------------------------------------------------------------------- Wound Assessment Details Patient Name: [name] Date of Service: 03/31/2016 3:00 PM Medical Record Number: [id number] Patient Account Number: [id number] Date of Birth/Sex: May 02, 1934 (82 [name] Female) Treating RN: Ashok Cordia, Debi Primary Care Physician: [name] Other Clinician: Referring Physician: [name] Treating Physician/Extender: STONE III, HOYT Weeks in Treatment: 15 Wound Status Wound Number: 13 Primary Diabetic Wound/Ulcer of the Lower Etiology: Extremity Wound Location: Left Calcaneus Wound Open Wounding Event: Gradually Appeared Status: Date Acquired: 12/31/2015  Comorbid Arrhythmia, Congestive Heart Failure, Weeks Of Treatment: 13 History: Hypertension, Type II Diabetes Clustered Wound: No Photos Photo Uploaded By: Alejandro MullingPinkerton, Debra on 03/31/2016 16:55:33 Wound Measurements Length: (cm) Width: (cm) Depth: (cm) Area: (cm) Volume: (cm) 0 % Reduction in Area: 100% 0 % Reduction in Volume: 100% 0 Epithelialization: Large (67-100%) 0 Tunneling: No 0 Undermining: No Wound Description Classification: Grade 1 Wound Margin: Distinct, outline attached Exudate Amount: None Present Foul Odor After Cleansing: No Wound Bed Granulation Amount: None Present (0%) Exposed Structure Necrotic Amount: None Present  (0%) Fascia Exposed: No Fat Layer Exposed: No Tendon Exposed: No Muscle Exposed: No Joint Exposed: No [name], Natalie Y. ([id number]) Bone Exposed: No Limited to Skin Breakdown Periwound Skin Texture Texture Color No Abnormalities Noted: No No Abnormalities Noted: No Moisture Temperature / Pain No Abnormalities Noted: No Temperature: No Abnormality Dry / Scaly: Yes Wound Preparation Ulcer Cleansing: Wound Cleanser Topical Anesthetic Applied: None Electronic Signature(s) Signed: 03/31/2016 5:06:01 PM By: Alejandro MullingPinkerton, Debra Entered By: Alejandro MullingPinkerton, Debra on 03/31/2016 15:56:29 [name], Natalie Y. ([id number]) -------------------------------------------------------------------------------- Wound Assessment Details Patient Name: [name], Shaundra Y. Date of Service: 03/31/2016 3:00 PM Medical Record Number: [id number] Patient Account Number: [id number] Date of Birth/Sex: Oct 05, 1933 (82 [name] Female) Treating RN: Ashok CordiaPinkerton, Debi Primary Care Physician: Rolm GalaGrandis, Heidi Other Clinician: Referring Physician: Rolm GalaGrandis, Heidi Treating Physician/Extender: Linwood DibblesSTONE III, HOYT Weeks in Treatment: 15 Wound Status Wound Number: 14 Primary Open Surgical Wound Etiology: Wound Location: Right Amputation Site - Digit Wound Open Wounding Event: Surgical Injury Status: Date Acquired: 11/11/2015 Comorbid Arrhythmia, Congestive Heart Failure, Weeks Of Treatment: 13 History: Hypertension, Type II Diabetes Clustered Wound: No Photos Photo Uploaded By: Alejandro MullingPinkerton, Debra on 03/31/2016 16:55:01 Wound Measurements Length: (cm) 0.2 Width: (cm) 0.2 Depth: (cm) 0.1 Area: (cm) 0.031 Volume: (cm) 0.003 % Reduction in Area: 95.9% % Reduction in Volume: 96% Epithelialization: None Tunneling: No Undermining: No Wound Description Full Thickness Without Exposed Classification: Support Structures Wound Margin: Flat and Intact Exudate Medium Amount: Exudate Type: Serous Exudate Color: amber Foul Odor  After Cleansing: No Wound Bed Granulation Amount: None Present (0%) Exposed Structure Necrotic Amount: Large (67-100%) Fascia Exposed: No Necrotic Quality: Eschar, Adherent Slough Fat Layer Exposed: No [name], Devine Y. ([id number]) Tendon Exposed: No Muscle Exposed: No Joint Exposed: No Bone Exposed: No Limited to Skin Breakdown Periwound Skin Texture Texture Color No Abnormalities Noted: No No Abnormalities Noted: No Callus: No Atrophie Blanche: No Crepitus: No Cyanosis: No Excoriation: No Ecchymosis: No Fluctuance: No Erythema: No Friable: No Hemosiderin Staining: No Induration: No Mottled: No Localized Edema: No Pallor: No Rash: No Rubor: No Scarring: No Temperature / Pain Moisture Temperature: No Abnormality No Abnormalities Noted: No Dry / Scaly: No Maceration: Yes Moist: Yes Wound Preparation Ulcer Cleansing: Rinsed/Irrigated with Saline Topical Anesthetic Applied: None, Other: lidocaine 4%, Treatment Notes Wound #14 (Right Amputation Site - Digit) 1. Cleansed with: Clean wound with Normal Saline 2. Anesthetic Topical Lidocaine 4% cream to wound bed prior to debridement 4. Dressing Applied: Other dressing (specify in notes) Notes Polymem Silver, band-aide Electronic Signature(s) Signed: 03/31/2016 5:06:01 PM By: Alejandro MullingPinkerton, Debra Entered By: Alejandro MullingPinkerton, Debra on 03/31/2016 15:31:11 [name], Anthonette Y. ([id number]) -------------------------------------------------------------------------------- Wound Assessment Details Patient Name: [name], Mehek Y. Date of Service: 03/31/2016 3:00 PM Medical Record Number: [id number] Patient Account Number: [id number] Date of Birth/Sex: Oct 05, 1933 (82 [name] Female) Treating RN: Phillis HaggisPinkerton, Debi Primary Care Physician: Rolm GalaGrandis, Heidi Other Clinician: Referring Physician: Rolm GalaGrandis, Heidi Treating Physician/Extender: STONE III, HOYT Weeks in Treatment: 15 Wound Status Wound Number: 9 Primary Diabetic Wound/Ulcer of  the  Lower Etiology: Extremity Wound Location: Right Achilles Secondary Arterial Insufficiency Ulcer Wounding Event: Gradually Appeared Etiology: Date Acquired: 06/02/2015 Wound Open Weeks Of Treatment: 15 Status: Clustered Wound: No Comorbid Arrhythmia, Congestive Heart Pending Amputation On Presentation History: Failure, Hypertension, Type II Diabetes Photos Photo Uploaded By: Alejandro MullingPinkerton, Debra on 03/31/2016 16:55:48 Wound Measurements Length: (cm) 10.2 Width: (cm) 3 Depth: (cm) 0.2 Area: (cm) 24.033 Volume: (cm) 4.807 % Reduction in Area: 26.3% % Reduction in Volume: 50.8% Epithelialization: None Tunneling: Yes Position (o'clock): 12 Maximum Distance: (cm) 2.6 Undermining: No Wound Description Classification: Grade 2 Foul Odor Af Wound Margin: Thickened Due to Produ Exudate Amount: Large Exudate Type: Serosanguineous Exudate Color: red, brown [name], Shadavia Y. ([id number]) ter Cleansing: Yes ct Use: No Wound Bed Granulation Amount: Medium (34-66%) Exposed Structure Granulation Quality: Red Fascia Exposed: No Necrotic Amount: Medium (34-66%) Fat Layer Exposed: No Necrotic Quality: Eschar, Adherent Slough Tendon Exposed: Yes Muscle Exposed: No Joint Exposed: No Bone Exposed: No Periwound Skin Texture Texture Color No Abnormalities Noted: No No Abnormalities Noted: No Callus: No Atrophie Blanche: No Crepitus: No Cyanosis: No Excoriation: No Ecchymosis: No Fluctuance: No Erythema: Yes Friable: No Erythema Location: Circumferential Induration: No Hemosiderin Staining: No Localized Edema: No Mottled: No Rash: No Pallor: No Scarring: No Rubor: No Moisture Temperature / Pain No Abnormalities Noted: No Tenderness on Palpation: Yes Dry / Scaly: No Maceration: Yes Moist: Yes Wound Preparation Ulcer Cleansing: Rinsed/Irrigated with Saline Topical Anesthetic Applied: Other: lidocaine 4%, Treatment Notes Wound #9 (Right Achilles) 1. Cleansed  with: Clean wound with Normal Saline 2. Anesthetic Topical Lidocaine 4% cream to wound bed prior to debridement 4. Dressing Applied: Other dressing (specify in notes) 5. Secondary Dressing Applied Dry Gauze Foam Kerlix/Conform 7. Secured with Tape Notes [name], Kaleiyah Y. ([id number]) Darcus Austincoban, Polymem Silver Electronic Signature(s) Signed: 03/31/2016 5:06:01 PM By: Alejandro MullingPinkerton, Debra Entered By: Alejandro MullingPinkerton, Debra on 03/31/2016 15:28:05 [name], Nessie Y. ([id number]) -------------------------------------------------------------------------------- Vitals Details Patient Name: [name], Aissata Y. Date of Service: 03/31/2016 3:00 PM Medical Record Number: [id number] Patient Account Number: [id number] Date of Birth/Sex: 17-Apr-1934 (82 [name] Female) Treating RN: Ashok CordiaPinkerton, Debi Primary Care Physician: Rolm GalaGrandis, Heidi Other Clinician: Referring Physician: Rolm GalaGrandis, Heidi Treating Physician/Extender: Linwood DibblesSTONE III, HOYT Weeks in Treatment: 15 Vital Signs Time Taken: 15:20 Temperature (F): 97.9 Height (in): 65 Pulse (bpm): 79 Weight (lbs): 160 Respiratory Rate (breaths/min): 18 Body Mass Index (BMI): 26.6 Blood Pressure (mmHg): 130/56 Reference Range: 80 - 120 mg / dl Electronic Signature(s) Signed: 03/31/2016 5:06:01 PM By: Alejandro MullingPinkerton, Debra Entered By: Alejandro MullingPinkerton, Debra on 03/31/2016 15:20:13

## 2016-04-01 NOTE — Progress Notes (Signed)
ILINE, BUCHINGER ([id number]) Visit Report for 03/31/2016 Chief Complaint Document Details Patient Name: [name], [name] 03/31/2016 3:00 Date of Service: PM Medical Record [id number] Number: Patient Account Number: [id number] 10/07/1933 (82 y.o. Treating RN: Phillis Haggis Date of Birth/Sex: Female) Other Clinician: Primary Care Physician: [name] Treating STONE III, HOYT Referring Physician: [name] Physician/Extender: Weeks in Treatment: 15 Information Obtained from: Patient Chief Complaint Patient is seen today for evaluation concerning her ongoing lower abdominal wound midline to left as well as her left heel wound and her right Achilles wound. Electronic Signature(s) Signed: 04/01/2016 1:37:38 AM By: Lenda Kelp PA-C Entered By: Lenda Kelp on 03/31/2016 23:04:40 [name] ([id number]) -------------------------------------------------------------------------------- Debridement Details Patient Name: Natalie, [name] 03/31/2016 3:00 Date of Service: PM Medical Record [id number] Number: Patient Account Number: [id number] 1933/06/13 (82 y.o. Treating RN: Phillis Haggis Date of Birth/Sex: Female) Other Clinician: Primary Care Physician: [name] Treating STONE III, HOYT Referring Physician: [name] Physician/Extender: Weeks in Treatment: 15 Debridement Performed for Wound #11 Midline Abdomen - Lower Quadrant Assessment: Performed By: Physician STONE III, HOYT E., PA-C Debridement: Debridement Pre-procedure Yes - 15:49 Verification/Time Out Taken: Start Time: 15:50 Pain Control: Lidocaine 4% Topical Solution Level: Skin/Subcutaneous Tissue Total Area Debrided (L x 0.2 (cm) x 0.3 (cm) = 0.06 (cm) W): Tissue and other Non-Viable, Exudate, Fibrin/Slough, Subcutaneous material debrided: Instrument: Curette Bleeding: Minimum Hemostasis Achieved: Pressure End Time: 15:52 Procedural Pain: 0 Post Procedural Pain:  0 Response to Treatment: Procedure was tolerated well Post Debridement Measurements of Total Wound Length: (cm) 0.2 Width: (cm) 0.3 Depth: (cm) 0.2 Volume: (cm) 0.009 Character of Wound/Ulcer Post Requires Further Debridement Debridement: Severity of Tissue Post Debridement: Fat layer exposed Post Procedure Diagnosis Same as Pre-procedure Electronic Signature(s) Signed: 03/31/2016 5:06:01 PM By: Alejandro Mulling Signed: 04/01/2016 1:37:38 AM By: Lacretia Leigh, Jay ([id number]) Entered By: Alejandro Mulling on 03/31/2016 15:53:44 [name] ([id number]) -------------------------------------------------------------------------------- Debridement Details Patient Name: Natalie, [name] 03/31/2016 3:00 Date of Service: PM Medical Record [id number] Number: Patient Account Number: [id number] March 20, 1934 (82 y.o. Treating RN: Ashok Cordia, Debi Date of Birth/Sex: Female) Other Clinician: Primary Care Physician: [name] Treating STONE III, HOYT Referring Physician: [name] Physician/Extender: Weeks in Treatment: 15 Debridement Performed for Wound #14 Right Amputation Site - Digit Assessment: Performed By: Physician STONE III, HOYT E., PA-C Debridement: Debridement Pre-procedure Yes - 15:49 Verification/Time Out Taken: Start Time: 15:54 Pain Control: Lidocaine 4% Topical Solution Level: Skin/Subcutaneous Tissue Total Area Debrided (L x 0.2 (cm) x 0.2 (cm) = 0.04 (cm) W): Tissue and other Non-Viable, Exudate, Fibrin/Slough, Subcutaneous material debrided: Instrument: Curette Bleeding: Minimum Hemostasis Achieved: Pressure End Time: 15:57 Procedural Pain: 0 Post Procedural Pain: 0 Response to Treatment: Procedure was tolerated well Post Debridement Measurements of Total Wound Length: (cm) 0.3 Width: (cm) 0.2 Depth: (cm) 0.3 Volume: (cm) 0.014 Character of Wound/Ulcer Post Requires Further Debridement Debridement: Severity of  Tissue Post Debridement: Fat layer exposed Post Procedure Diagnosis Same as Pre-procedure Electronic Signature(s) Signed: 03/31/2016 5:06:01 PM By: Alejandro Mulling Signed: 04/01/2016 1:37:38 AM By: Lacretia Leigh, En[place]lin ([id number]) Entered By: Alejandro Mulling on 03/31/2016 16:00:55 [name] ([id number]) -------------------------------------------------------------------------------- Debridement Details Patient Name: Natalie, [name] 03/31/2016 3:00 Date of Service: PM Medical Record [id number] Number: Patient Account Number: [id number] April 18, 1934 (82 y.o. Treating RN: Phillis Haggis Date of Birth/Sex: Female) Other Clinician: Primary Care Physician: [name] Treating STONE III, HOYT Referring Physician: [name] Physician/Extender: Weeks in Treatment: 15 Debridement  Performed for Wound #9 Right Achilles Assessment: Performed By: Physician STONE III, HOYT E., PA-C Debridement: Debridement Pre-procedure Yes - 15:49 Verification/Time Out Taken: Start Time: 16:04 Pain Control: Lidocaine 4% Topical Solution Level: Skin/Subcutaneous Tissue Total Area Debrided (L x 10.2 (cm) x 3 (cm) = 30.6 (cm) W): Tissue and other Non-Viable, Exudate, Fibrin/Slough, Subcutaneous material debrided: Instrument: Curette Bleeding: Minimum Hemostasis Achieved: Pressure End Time: 16:09 Procedural Pain: 0 Post Procedural Pain: 0 Response to Treatment: Procedure was tolerated well Post Debridement Measurements of Total Wound Length: (cm) 10.2 Width: (cm) 3 Depth: (cm) 0.2 Volume: (cm) 4.807 Character of Wound/Ulcer Post Requires Further Debridement Debridement: Severity of Tissue Post Debridement: Fat layer exposed Post Procedure Diagnosis Same as Pre-procedure Electronic Signature(s) Signed: 03/31/2016 5:06:01 PM By: Alejandro Mulling Signed: 04/01/2016 1:37:38 AM By: Lacretia Leigh, [place] ([id number]) Entered By: Alejandro Mulling on 03/31/2016 16:07:00 [name] ([id number]) -------------------------------------------------------------------------------- HPI Details Patient Name: Natalie, [name] 03/31/2016 3:00 Date of Service: PM Medical Record [id number] Number: Patient Account Number: [id number] 10/13/1933 (82 y.o. Treating RN: Phillis Haggis Date of Birth/Sex: Female) Other Clinician: Primary Care Physician: [name] Treating STONE III, HOYT Referring Physician: [name] Physician/Extender: Weeks in Treatment: 15 History of Present Illness Location: Currently patient has just remaining a wound on her lower midline abdominal region, right fourth toe amputation site, and right Achilles region. Quality: Patient is not not feeling any discomfort at this point in time and tells me that she is feeling good at this point Severity: 1 out of 10 Timing: Pain in wound is Intermittent but for the most part is worse with dressing changes daily this is still minimal Context: The wound appeared gradually over time Modifying Factors: Wound interventions which have been performed at [facility] skilled nursing facility seem to have been helpful at this point in time. Patient's daughter as well as patient are very encouraged with the help she has received from the facility. Associated Signs and Symptoms: Patient has congestive heart failure, chronic respiratory failure, end- stage renal disease for which she currently is in dialysis, atrial fibrillation, diabetes mellitus HPI Description: 82 year old patient who is known to be diabetic, was referred to [place] by Dr. Gavin Potters for a right heel ulceration which she's had for a while. She was recently in hospital for a pneumonia and at that time and got delirious and was disoriented and sometime during this time developed a stage II ulcer on her right heel. Her past medical history is significant for bilateral pneumonia which was treated with  injectable antibiotics and then to oral Levaquin which he has completed. She also has acute on chronic diastolic CHF, acute on chronic respiratory failure, end-stage renal disease on hemodialysis, atrial fibrillation, recent stroke, diabetes mellitus. The patient and her son are poor historians but from what I understand she was admitted to the hospital with an acute vascular compromise of her right lower extremity and Dr. Wyn Quaker has done a surgical procedure and we are trying to obtain these notes. There are also some vascular workup done and we will try and obtain these notes. the injury to the left lower quadrant of abdomen and the suprapubic area have been there due to a bruise and have been there for several months and no intervention has been done. 10/11/2015 -- on review of the electronics records it was noted that the patient was admitted to the hospital on 09/14/2015 with peripheral vascular disease with claudication, end-stage renal disease, pressure ulcer, chronic atrial fibrillation.  She was seen by Dr. Wyn Quaker who did her right lower extremity angiogram , angioplasty of the right anterior tibial artery and thrombolysis with TPA of the right popliteal artery, and thrombectomy. She was seen by Dr. Wyn Quaker during this past week and he was pleased with the progress. He did say that if he took her to the operating room for any procedure he would debride the abdominal wound under anesthesia. She was also seen by Dr. Ether Griffins the podiatrist who thought that she may lose her right fourth toe at some stage may need an amputation of this. DENINA, RIEGER ([id number]) 10/21/2015 --patient known to Dr. Wyn Quaker and his last office visit from 10/04/2015 has been reviewed. 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 She also had a persistent ulceration of the right heel and markedly this area  and a right third and fourth toe and a small scab on the calf but these are dry and seemed to be improving. Patient also has a left carotid endarterectomy and multiple interventions to a right brachiocephalic AV fistula. After the visit he had recommended noninvasive studies to recheck her revascularization. He was off the impression that she would likely lose the right fourth toe and the third toe was likely to heal. He was concerned about underlying muscle necrosis on her right heel and midfoot. 11/01/2015 -- an echo done in January of this year showed her left ventricular ejection fraction to be about 50-55%. The patient was seen by the PA and Dr. Driscilla Grammes office and the plan was to take her to the operating room soon to have a debridement under anesthesia for the abdominal wall wound, the Achilles tendon on the right leg and amputation of the right fourth toe. The daughter and the patient do not feel that they would be able to undergo hyperbaric oxygen therapy 5 days a week for 6 weeks. 12/17/15; this is a medically complex woman who I note was recently in this clinic however I was not involved with her care. She returns today with multiple wounds; a) she has a wound in the mid abdomen that is been there since March of this year. I note that she is been to the overall for debridement recently. The exact etiology of this wound is not really clear b) left lower quadrant abdominal wound had some sanguinous drainage when she came in here. The patient fell in January and thinks this may have been secondary to a hematoma. c): The patient has 3 wounds on her right leg including a small wound on the right mid calf, a large area over the Achilles which currently has a wound VAC for the last 6 weeks, also a smaller wound on the distal part of the right heel. As far as I understand most of these wounds are currently been dressed with's calcium alginate. According to her daughter the Achilles wound under  the wound VAC is doing well d) the patient is had an amputation of her left fifth toe in January and the right fourth toe 6 weeks ago secondary to diabetic PAD e) the patient has chronic renal failure on dialysis for the last 2 years secondary to type 2 diabetes on insulin. The daughter's knowledge there is been no biopsy of the abdominal wounds given their current appearance and lack of undefined etiology at have to wonder about calciphylaxis. 12/18/15:Addendum; I [facility] I can see no  relevant x-rays of the right heel. I note her arteriogram and revascularization of her right lower extremity in April 2017. She had debridement of both abdominal wounds and the right heel and Achilles wound on 11/07/15 at which time she had a right fourth toe ray amputation. The abdominal wounds were debridement again on 6/29. I do not see any relevant pathology of these abdominal wounds 12/24/15; culture I did of the drainage from the midline abdominal wound last week showed both Proteus and ampicillin sensitive enterococcus. I've given her a course of Augmentin adjusted on dialysis days. She has no specific complaints today. Been using Santyl to the abdominal wounds in the right leg wound and the wound VAC on the right Achilles which was initially prescribed by Dr. dew 12/31/15; I have done two punch biopsies of the large midline abdominal. 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 May have been a trauma component of the one on the left lower quadrant however the midline wound had no such history. She has a large area on the right Achilles heel with a wound VAC prescribed by Dr. dew. A small wound on the right anterior leg.Marland Kitchen. UNFORTUNATELY she has 2 new wounds today. One on the left heel which is probably a pressure area. As well her previous amputation site of her right fourth toe has dehisced and now has a small wound with significant depth at the amputation site. Helane RimaHOPKINS, Luci Y.  ([id number]) 01/14/2016 -- she returns after 2 weeks and had had a punch biopsy of abdominal wound done the last visit -- had a biopsy of her midline abdominal wound done and the Pathology diagnosis is that of ulceration, necrosis and inflammation and negative for dysplasia and malignancy. 01/21/16. 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 In the meantime she has new wounds of the left heel the right fourth toe amputation site is opened up. The back is stopped to the right heel area. 01/28/16; the abdominal wounds continued to improve. The extensive wound on her right Achilles also looks stable except for the lower aspect of the wound where there is a large liquefied area that probes right down to her calcaneus. This cultured Proteus last week I have her on Augmentin and doxycycline 1. I think this is going to need a course of IV antibiotics and I will call dialysis. X-ray I did last week was negative, I think she is going to need an MRI 02/04/16; right heel MRI as before Saturday. Receiving I believe IV Rocephin at dialysis 02/11/16; as it turns out the patient could not have a MRI as she has a bladder stimulator in place even though it is not currently in use since the beginning of this year. Although she has an allergy to IV contrast she apparently has done well with premedication so we will have to go for a CT scan with contrast. In the meantime she has had a fall now has a large skin tear on her left upper arm. She went to the ER and they suggested Tegaderm over topical antibiotics 03/03/16 currently patient returns after having been hospitalized for 2 weeks and [facility]. She actually seems to be doing excellent compared to even when we last saw her according to our nursing staff. Both patient and her daughter are extremely encouraged at how well she is presenting at this point in  time. Overall the biggest issue is still the right Achilles area which is  being managed at this point in time by Dr. Wyn Quaker. Patient is currently utilizing a wound VAC to that region. 03/17/16; patient is at [facility] nursing home still. Using Aquacel Ag to the wounds on her bilateral feet and still Prisma to the small open area on her abdomen. 03/31/16 at this point in time patient has been tolerating the dressing changes currently. She fortunately has no worsening of her symptoms although she tells me that the nurse who is caring for her at [facility] nursing facility decided that nothing was needed in regard to the lower abdominal wound from a dressing standpoint at this time. she is really not having significant discomfort or pain at this point she continues to have some tunneling in the proximal Achilles wound region. Electronic Signature(s) Signed: 04/01/2016 1:37:38 AM By: Lenda Kelp PA-C Entered By: Lenda Kelp on 03/31/2016 23:07:11 [name] ([id number]) -------------------------------------------------------------------------------- Physical Exam Details Patient Name: CHAISE, PASSARELLA 03/31/2016 3:00 Date of Service: PM Medical Record [id number] Number: Patient Account Number: [id number] 1933/07/31 (82 y.o. Treating RN: Phillis Haggis Date of Birth/Sex: Female) Other Clinician: Primary Care Physician: [name] Treating STONE III, HOYT Referring Physician: [name] Physician/Extender: Weeks in Treatment: 15 Constitutional Well-nourished and well-hydrated in no acute distress. Respiratory normal breathing without difficulty. Psychiatric this patient is able to make decisions and demonstrates good insight into disease process. Alert and Oriented x 3. pleasant and cooperative. Notes In regard to patient's lower abdominal wound this appears to still be somewhat small although there was some exudate and slough noted over the wound bed at this point.  it is definitely not fully closed. In regard to the Achilles region she still has tunneling proximally with slough over the wound bed. In regard to the distal right fourth toe amputation site there was slough and exudate as well as eschar over the wound region. When this was unroofed she did have a wound still present at this point in time of this location. In regard to the left heel disappears healed at this point in time. Electronic Signature(s) Signed: 04/01/2016 1:37:38 AM By: Lenda Kelp PA-C Entered By: Lenda Kelp on 03/31/2016 23:09:17 [name] ([id number]) -------------------------------------------------------------------------------- Physician Orders Details Patient Name: FIORELA, PELZER 03/31/2016 3:00 Date of Service: PM Medical Record [id number] Number: Patient Account Number: [id number] [birth year] (82 y.o. Treating RN: Phillis Haggis Date of Birth/Sex: Female) Other Clinician: Primary Care Physician: [name] Treating STONE III, HOYT Referring Physician: [name] Physician/Extender: Weeks in Treatment: 15 Verbal / Phone Orders: Yes Clinician: Ashok Cordia, Debi Read Back and Verified: Yes Diagnosis Coding Wound Cleansing o Clean wound with Normal Saline. Anesthetic o Topical Lidocaine 4% cream applied to wound bed prior to debridement - all wounds for clinic use only Primary Wound Dressing Wound #11 Midline Abdomen - Lower Quadrant o Other: - PolyMem Silver Wound #14 Right Amputation Site - Digit o Other: - PolyMem Silver Wound #9 Right Achilles o Other: - PolyMem Silver Also please lightly pack into 12 o'clock tunnel Secondary Dressing Wound #11 Midline Abdomen - Lower Quadrant o Boardered Foam Dressing Wound #14 Right Amputation Site - Digit o Non-adherent pad - band-aide Wound #9 Right Achilles o Conform/Kerlix o Foam Dressing Change Frequency Wound #11 Midline Abdomen - Lower Quadrant o Other: - Change  2 times a week. Change at nursing facility once a week on Fridays and once at the wound care center on Tuesdays. Wound #14 Right Amputation Site - Digit ANOUK, CRITZER ([id number]) o Other: -  Change 2 times a week. Change at nursing facility once a week on Fridays and once at the wound care center on Tuesdays. Follow-up Appointments Wound #11 Midline Abdomen - Lower Quadrant o Return Appointment in 1 week. Wound #14 Right Amputation Site - Digit o Return Appointment in 1 week. Wound #9 Right Achilles o Return Appointment in 1 week. Off-Loading Wound #9 Right Achilles o Other: - Float heels Electronic Signature(s) Signed: 03/31/2016 5:06:01 PM By: Alejandro MullingPinkerton, Debra Signed: 04/01/2016 1:37:38 AM By: Lenda KelpStone III, Hoyt PA-C Entered By: Alejandro MullingPinkerton, Debra on 03/31/2016 16:15:04 Helane RimaHOPKINS, Chonita Y. ([id number]) -------------------------------------------------------------------------------- Problem List Details Patient Name: Helane RimaHOPKINS, Marceil Y. 03/31/2016 3:00 Date of Service: PM Medical Record [id number] Number: Patient Account Number: [id number] [birth year] (82 y.o. Treating RN: Phillis HaggisPinkerton, Debi Date of Birth/Sex: Female) Other Clinician: Primary Care Physician: Rolm GalaGrandis, Heidi Treating STONE III, HOYT Referring Physician: Rolm GalaGrandis, Heidi Physician/Extender: Weeks in Treatment: 15 Active Problems ICD-10 Encounter Code Description Active Date Diagnosis S31.104A Unspecified open wound of abdominal wall, left lower 12/17/2015 Yes quadrant without penetration into peritoneal cavity, initial encounter L97.512 Non-pressure chronic ulcer of other part of right foot with 03/31/2016 Yes fat layer exposed E11.51 Type 2 diabetes mellitus with diabetic peripheral 12/17/2015 Yes angiopathy without gangrene E11.621 Type 2 diabetes mellitus with foot ulcer 12/17/2015 Yes Inactive Problems Resolved Problems Electronic Signature(s) Signed: 04/01/2016 1:37:38 AM By: Lenda KelpStone III, Hoyt  PA-C Entered By: Lenda KelpStone III, Hoyt on 03/31/2016 23:03:56 Helane RimaHOPKINS, Kellan Y. ([id number]) -------------------------------------------------------------------------------- Progress Note Details Patient Name: Helane RimaHOPKINS, Sherlynn Y. 03/31/2016 3:00 Date of Service: PM Medical Record [id number] Number: Patient Account Number: [id number] [birth year] (82 y.o. Treating RN: Phillis HaggisPinkerton, Debi Date of Birth/Sex: Female) Other Clinician: Primary Care Physician: Rolm GalaGrandis, Heidi Treating STONE III, HOYT Referring Physician: Rolm GalaGrandis, Heidi Physician/Extender: Weeks in Treatment: 15 Subjective Chief Complaint Information obtained from Patient Patient is seen today for evaluation concerning her ongoing lower abdominal wound midline to left as well as her left heel wound and her right Achilles wound. History of Present Illness (HPI) The following HPI elements were documented for the patient's wound: Location: Currently patient has just remaining a wound on her lower midline abdominal region, right fourth toe amputation site, and right Achilles region. Quality: Patient is not not feeling any discomfort at this point in time and tells me that she is feeling good at this point Severity: 1 out of 10 Timing: Pain in wound is Intermittent but for the most part is worse with dressing changes daily this is still minimal Context: The wound appeared gradually over time Modifying Factors: [facility]ound [facility] seem to have been helpful at this point in time. Patient's daughter as well as patient are very encouraged with the help she has received from the facility. Associated Signs and Symptoms: Patient has congestive heart failure, chronic respiratory failure, end-stage renal disease for which she currently is in dialysis, atrial fibrillation, diabetes mellitus 82 year old patient who is known to be diabetic, was referred to us by Dr. Gavin PottersGrandis for a right heel  ulceration which she's had for a while. She was recently in hospital for a pneumonia and at that time and got delirious and was disoriented and sometime during this time developed a stage II ulcer on her right heel. Her past medical history is significant for bilateral pneumonia which was treated with injectable antibiotics and then to oral Levaquin which he has completed. She also has acute on chronic diastolic CHF, acute on chronic respiratory failure, end-stage renal disease on  hemodialysis, atrial fibrillation, recent stroke, diabetes mellitus. The patient and her son are poor historians but from what I understand she was admitted to the hospital with an acute vascular compromise of her right lower extremity and Dr. Wyn Quaker has done a surgical procedure and we are trying to obtain these notes. There are also some vascular workup done and we will try and obtain these notes. the injury to the left lower quadrant of abdomen and the suprapubic area have been there due to a bruise and have been there for several months and no intervention has been done. 10/11/2015 -- on review of the electronics records it was noted that the patient was admitted to the hospital NASIM, GAROFANO ([id number]) on 09/14/2015 with peripheral vascular disease with claudication, end-stage renal disease, pressure ulcer, chronic atrial fibrillation. She was seen by Dr. Wyn Quaker who did her right lower extremity angiogram , angioplasty of the right anterior tibial artery and thrombolysis with TPA of the right popliteal artery, and thrombectomy. She was seen by Dr. Wyn Quaker during this past week and he was pleased with the progress. He did say that if he took her to the operating room for any procedure he would debride the abdominal wound under anesthesia. She was also seen by Dr. Ether Griffins the podiatrist who thought that she may lose her right fourth toe at some stage may need an amputation of this. 10/21/2015 --patient known to Dr. Wyn Quaker  and his last office visit from 10/04/2015 has been reviewed. 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 She also had a persistent ulceration of the right heel and markedly this area and a right third and fourth toe and a small scab on the calf but these are dry and seemed to be improving. Patient also has a left carotid endarterectomy and multiple interventions to a right brachiocephalic AV fistula. After the visit he had recommended noninvasive studies to recheck her revascularization. He was off the impression that she would likely lose the right fourth toe and the third toe was likely to heal. He was concerned about underlying muscle necrosis on her right heel and midfoot. 11/01/2015 -- an echo done in January of this year showed her left ventricular ejection fraction to be about 50-55%. The patient was seen by the PA and Dr. Driscilla Grammes office and the plan was to take her to the operating room soon to have a debridement under anesthesia for the abdominal wall wound, the Achilles tendon on the right leg and amputation of the right fourth toe. The daughter and the patient do not feel that they would be able to undergo hyperbaric oxygen therapy 5 days a week for 6 weeks. 12/17/15; this is a medically complex woman who I note was recently in this clinic however I was not involved with her care. She returns today with multiple wounds; a) she has a wound in the mid abdomen that is been there since March of this year. I note that she is been to the overall for debridement recently. The exact etiology of this wound is not really clear b) left lower quadrant abdominal wound had some sanguinous drainage when she came in here. The patient fell in January and thinks this may have been secondary to a hematoma. c): The patient has 3 wounds on her right leg including a small wound on the right  mid calf, a large area over the Achilles which currently  has a wound VAC for the last 6 weeks, also a smaller wound on the distal part of the right heel. As far as I understand most of these wounds are currently been dressed with's calcium alginate. According to her daughter the Achilles wound under the wound VAC is doing well d) the patient is had an amputation of her left fifth toe in January and the right fourth toe 6 weeks ago secondary to diabetic PAD e) the patient has chronic renal failure on dialysis for the last 2 years secondary to type 2 diabetes on insulin. The daughter's knowledge there is been no biopsy of the abdominal wounds given their current appearance and lack of undefined etiology at have to wonder about calciphylaxis. 12/18/15:Addendum; I have reviewed [facility] I can see no relevant x-rays of the right heel. I note her arteriogram and revascularization of her right lower extremity in April 2017. She had debridement of both abdominal wounds and the right heel and Achilles wound on 11/07/15 at which time she had a right fourth toe ray amputation. The abdominal wounds were debridement again on 6/29. I do not see any relevant pathology of these abdominal wounds 12/24/15; culture I did of the drainage from the midline abdominal wound last week showed both Proteus and JENILEE, FRANEY. ([id number]) ampicillin sensitive enterococcus. I've given her a course of Augmentin adjusted on dialysis days. She has no specific complaints today. Been using Santyl to the abdominal wounds in the right leg wound and the wound VAC on the right Achilles which was initially prescribed by Dr. dew 12/31/15; I have done two punch biopsies of the large midline abdominal. 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 May have been a trauma component of the one on the left lower quadrant however the midline wound had no such history. She has a large area on the right Achilles heel with a wound VAC prescribed by Dr.  dew. A small wound on the right anterior leg.Marland Kitchen UNFORTUNATELY she has 2 new wounds today. One on the left heel which is probably a pressure area. As well her previous amputation site of her right fourth toe has dehisced and now has a small wound with significant depth at the amputation site. 01/14/2016 -- she returns after 2 weeks and had had a punch biopsy of abdominal wound done the last visit -- had a biopsy of her midline abdominal wound done and the Pathology diagnosis is that of ulceration, necrosis and inflammation and negative for dysplasia and malignancy. 01/21/16. 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 In the meantime she has new wounds of the left heel the right fourth toe amputation site is opened up. The back is stopped to the right heel area. 01/28/16; the abdominal wounds continued to improve. The extensive wound on her right Achilles also looks stable except for the lower aspect of the wound where there is a large liquefied area that probes right down to her calcaneus. This cultured Proteus last week I have her on Augmentin and doxycycline 1. I think this is going to need a course of IV antibiotics and I will call dialysis. X-ray I did last week was negative, I think she is going to need an MRI 02/04/16; right heel MRI as before Saturday. Receiving I believe IV Rocephin at dialysis 02/11/16; as it turns out the patient could not have a MRI as she has a bladder stimulator in place even though it is not currently in  use since the beginning of this year. Although she has an allergy to IV contrast she apparently has done well with premedication so we will have to go for a CT scan with contrast. In the meantime she has had a fall now has a large skin tear on her left upper arm. She went to the ER and they suggested Tegaderm over topical antibiotics 03/03/16 currently patient returns after having been hospitalized  for 2 weeks and subsequently transferred to [facility] nursing facility. She actually seems to be doing excellent compared to even when we last saw her according to our nursing staff. Both patient and her daughter are extremely encouraged at how well she is presenting at this point in time. Overall the biggest issue is still the right Achilles area which is being managed at this point in time by Dr. Wyn Quaker. Patient is currently utilizing a wound VAC to that region. 03/17/16; patient is at [facility] nursing home still. Using Aquacel Ag to the wounds on her bilateral feet and still Prisma to the small open area on her abdomen. 03/31/16 at this point in time patient has been tolerating the dressing changes currently. She fortunately has no worsening of her symptoms although she tells me that the nurse who is caring for her at [facility] nursing facility decided that nothing was needed in regard to the lower abdominal wound from a dressing standpoint at this time. she is really not having significant discomfort or pain at this point she continues to have some tunneling in the proximal Achilles wound region. Objective Constitutional Well-nourished and well-hydrated in no acute distress. LAELA, DEVINEY ([id number]) Vitals Time Taken: 3:20 PM, Height: 65 in, Weight: 160 lbs, BMI: 26.6, Temperature: 97.9 F, Pulse: 79 bpm, Respiratory Rate: 18 breaths/min, Blood Pressure: 130/56 mmHg. Respiratory normal breathing without difficulty. Psychiatric this patient is able to make decisions and demonstrates good insight into disease process. Alert and Oriented x 3. pleasant and cooperative. General Notes: In regard to patient's lower abdominal wound this appears to still be somewhat small although there was some exudate and slough noted over the wound bed at this point. it is definitely not fully closed. In regard to the Achilles region she still has tunneling proximally with slough over the wound bed.  In regard to the distal right fourth toe amputation site there was slough and exudate as well as eschar over the wound region. When this was unroofed she did have a wound still present at this point in time of this location. In regard to the left heel disappears healed at this point in time. Integumentary (Hair, Skin) Wound #11 status is Open. Original cause of wound was Trauma. The wound is located on the Midline Abdomen - Lower Quadrant. The wound measures 0.2cm length x 0.3cm width x 0.1cm depth; 0.047cm^2 area and 0.005cm^3 volume. The wound is limited to skin breakdown. There is no tunneling or undermining noted. There is a medium amount of serosanguineous drainage noted. The wound margin is flat and intact. There is no granulation within the wound bed. There is a large (67-100%) amount of necrotic tissue within the wound bed including Eschar and Adherent Slough. The periwound skin appearance exhibited: Moist, Erythema. The periwound skin appearance did not exhibit: Callus, Crepitus, Excoriation, Fluctuance, Friable, Induration, Localized Edema, Rash, Scarring, Dry/Scaly, Maceration, Atrophie Blanche, Cyanosis, Ecchymosis, Hemosiderin Staining, Mottled, Pallor, Rubor. The surrounding wound skin color is noted with erythema which is circumferential. Periwound temperature was noted as No Abnormality. The periwound has tenderness on  palpation. Wound #13 status is Open. Original cause of wound was Gradually Appeared. The wound is located on the Left Calcaneus. The wound measures 0cm length x 0cm width x 0cm depth; 0cm^2 area and 0cm^3 volume. The wound is limited to skin breakdown. There is no tunneling or undermining noted. There is a none present amount of drainage noted. The wound margin is distinct with the outline attached to the wound base. There is no granulation within the wound bed. There is no necrotic tissue within the wound bed. The periwound skin appearance exhibited: Dry/Scaly.  Periwound temperature was noted as No Abnormality. Wound #14 status is Open. Original cause of wound was Surgical Injury. The wound is located on the Right Amputation Site - Digit. The wound measures 0.2cm length x 0.2cm width x 0.1cm depth; 0.031cm^2 area and 0.003cm^3 volume. The wound is limited to skin breakdown. There is no tunneling or undermining noted. There is a medium amount of serous drainage noted. The wound margin is flat and intact. There is no granulation within the wound bed. There is a large (67-100%) amount of necrotic tissue within the wound bed including Eschar and Adherent Slough. The periwound skin appearance exhibited: Maceration, Moist. The periwound skin appearance did not exhibit: Callus, Crepitus, Excoriation, Fluctuance, Friable, Induration, Localized Edema, Rash, Scarring, Dry/Scaly, Atrophie Blanche, Cyanosis, Ecchymosis, Hemosiderin Staining, Mottled, Pallor, Rubor, Erythema. Periwound temperature was noted as No Abnormality. LANGSTON, SUMMERFIELD ([id number]) Wound #9 status is Open. Original cause of wound was Gradually Appeared. The wound is located on the Right Achilles. The wound measures 10.2cm length x 3cm width x 0.2cm depth; 24.033cm^2 area and 4.807cm^3 volume. There is tendon exposed. There is no undermining noted, however, there is tunneling at 12:00 with a maximum distance of 2.6cm. There is a large amount of serosanguineous drainage noted. The wound margin is thickened. There is medium (34-66%) red granulation within the wound bed. There is a medium (34-66%) amount of necrotic tissue within the wound bed including Eschar and Adherent Slough. The periwound skin appearance exhibited: Maceration, Moist, Erythema. The periwound skin appearance did not exhibit: Callus, Crepitus, Excoriation, Fluctuance, Friable, Induration, Localized Edema, Rash, Scarring, Dry/Scaly, Atrophie Blanche, Cyanosis, Ecchymosis, Hemosiderin Staining, Mottled, Pallor, Rubor. The  surrounding wound skin color is noted with erythema which is circumferential. The periwound has tenderness on palpation. Assessment Active Problems ICD-10 S31.104A - Unspecified open wound of abdominal wall, left lower quadrant without penetration into peritoneal cavity, initial encounter L97.512 - Non-pressure chronic ulcer of other part of right foot with fat layer exposed E11.51 - Type 2 diabetes mellitus with diabetic peripheral angiopathy without gangrene E11.621 - Type 2 diabetes mellitus with foot ulcer Diagnoses ICD-10 S31.104A: Unspecified open wound of abdominal wall, left lower quadrant without penetration into peritoneal cavity, initial encounter L97.512: Non-pressure chronic ulcer of other part of right foot with fat layer exposed E11.51: Type 2 diabetes mellitus with diabetic peripheral angiopathy without gangrene E11.621: Type 2 diabetes mellitus with foot ulcer Procedures Wound #11 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 There was a Skin/Subcutaneous Tissue Debridement ([id number]) debridement with total area of 0.06 sq cm performed by STONE III, HOYT E., PA-C. with the following instrument(s): Curette to remove Non-Viable tissue/material including Exudate, Fibrin/Slough, and Subcutaneous after achieving pain control using Lidocaine 4% Topical Solution. A time out was conducted at 15:49, prior to the start of the procedure. A Minimum amount of bleeding was controlled with Pressure. The procedure was tolerated well with a  pain level of 0 throughout and a pain level of 0 following the procedure. Post Debridement Measurements: 0.2cm MARYLAN, GLORE ([id number]) length x 0.3cm width x 0.2cm depth; 0.009cm^3 volume. Character of Wound/Ulcer Post Debridement requires further debridement. Severity of Tissue Post Debridement is: Fat layer exposed. Post procedure Diagnosis Wound #11: Same as Pre-Procedure Wound #14 Wound #14 is an Open  Surgical Wound located on the Right Amputation Site - Digit . There was a Skin/Subcutaneous Tissue Debridement ([id number]) debridement with total area of 0.04 sq cm performed by STONE III, HOYT E., PA-C. with the following instrument(s): Curette to remove Non-Viable tissue/material including Exudate, Fibrin/Slough, and Subcutaneous after achieving pain control using Lidocaine 4% Topical Solution. A time out was conducted at 15:49, prior to the start of the procedure. A Minimum amount of bleeding was controlled with Pressure. The procedure was tolerated well with a pain level of 0 throughout and a pain level of 0 following the procedure. Post Debridement Measurements: 0.3cm length x 0.2cm width x 0.3cm depth; 0.014cm^3 volume. Character of Wound/Ulcer Post Debridement requires further debridement. Severity of Tissue Post Debridement is: Fat layer exposed. Post procedure Diagnosis Wound #14: Same as Pre-Procedure Wound #9 Wound #9 is a Diabetic Wound/Ulcer of the Lower Extremity located on the Right Achilles . There was a Skin/Subcutaneous Tissue Debridement ([id number]) debridement with total area of 30.6 sq cm performed by STONE III, HOYT E., PA-C. with the following instrument(s): Curette to remove Non-Viable tissue/material including Exudate, Fibrin/Slough, and Subcutaneous after achieving pain control using Lidocaine 4% Topical Solution. A time out was conducted at 15:49, prior to the start of the procedure. A Minimum amount of bleeding was controlled with Pressure. The procedure was tolerated well with a pain level of 0 throughout and a pain level of 0 following the procedure. Post Debridement Measurements: 10.2cm length x 3cm width x 0.2cm depth; 4.807cm^3 volume. Character of Wound/Ulcer Post Debridement requires further debridement. Severity of Tissue Post Debridement is: Fat layer exposed. Post procedure Diagnosis Wound #9: Same as Pre-Procedure Plan Wound Cleansing: Clean wound  with Normal Saline. Anesthetic: Topical Lidocaine 4% cream applied to wound bed prior to debridement - all wounds for clinic use only Primary Wound Dressing: Wound #11 Midline Abdomen - Lower Quadrant: Other: - PolyMem Silver Wound #14 Right Amputation Site - Digit: Other: - PolyMem Silver Wound #9 Right Achilles: IZETTA, SAKAMOTO ([id number]) Other: - PolyMem Silver Also please lightly pack into 12 o'clock tunnel Secondary Dressing: Wound #11 Midline Abdomen - Lower Quadrant: Boardered Foam Dressing Wound #14 Right Amputation Site - Digit: Non-adherent pad - band-aide Wound #9 Right Achilles: Conform/Kerlix Foam Dressing Change Frequency: Wound #11 Midline Abdomen - Lower Quadrant: Other: - Change 2 times a week. Change at nursing facility once a week on Fridays and once at the wound care center on Tuesdays. Wound #14 Right Amputation Site - Digit: Other: - Change 2 times a week. Change at nursing facility once a week on Fridays and once at the wound care center on Tuesdays. Follow-up Appointments: Wound #11 Midline Abdomen - Lower Quadrant: Return Appointment in 1 week. Wound #14 Right Amputation Site - Digit: Return Appointment in 1 week. Wound #9 Right Achilles: Return Appointment in 1 week. Off-Loading: Wound #9 Right Achilles: Other: - Float heels Follow-Up Appointments: A follow-up appointment should be scheduled. Medication Reconciliation completed and provided to Patient/Care Provider. A Patient Clinical Summary of Care was provided to [facility] At this point in time in regard to patient's wounds are much she can  try something different that we have not up to this point. I did recommend PolyMem Silver rope to be packed into the proximal Achilles region. The PolyMem dressing was placed over the remaining wounds at this point in time in regard to the abdomen, Achilles, and right fourth toe amputation site. This can be changed twice a week once today which is on Tuesday and  again on Friday until she is seen back next Tuesday. If she has any other concerns in the meantime she will contact the office and let us know all questions and concerns were answered to the best of my ability in regard to what both the patient as well as her daughter at to inquire of today. Otherwise again it was a pleasure seeing this patient today. SEBA, MADOLE ([id number]) Electronic Signature(s) Signed: 04/01/2016 1:37:38 AM By: Lenda Kelp PA-C Entered By: Lenda Kelp on 03/31/2016 23:11:47 Fatim, Vanderschaaf Vinetta Bergamo ([id number]) -------------------------------------------------------------------------------- SuperBill Details Patient Name: [name] Date of Service: 03/31/2016 Medical Record Number: [id number] Patient Account Number: [id number] Date of Birth/Sex: 03/27/1934 ([name]) Treating RN: Ashok Cordia, Debi Primary Care Physician: [name] Other Clinician: Referring Physician: [name] Treating Physician/Extender: Linwood Dibbles, HOYT Weeks in Treatment: 15 Diagnosis Coding ICD-10 Codes Code Description Unspecified open wound of abdominal wall, left lower quadrant without penetration into S31.104A peritoneal cavity, initial encounter L97.512 Non-pressure chronic ulcer of other part of right foot with fat layer exposed E11.51 Type 2 diabetes mellitus with diabetic peripheral angiopathy without gangrene E11.621 Type 2 diabetes mellitus with foot ulcer Facility Procedures CPT4: Description Modifier Quantity Code [id number] 11042 - DEB SUBQ TISSUE 20 SQ CM/< 1 ICD-10 Description Diagnosis S31.104A Unspecified open wound of abdominal wall, left lower quadrant without penetration into peritoneal cavity, initial encounter  L97.512 Non-pressure chronic ulcer of other part of right foot with fat layer exposed CPT4: 32440102 11045 - DEB SUBQ TISS EA ADDL 20CM 1 ICD-10 Description Diagnosis S31.104A Unspecified open wound of abdominal wall, left lower quadrant  without penetration into peritoneal cavity, initial encounter L97.512 Non-pressure chronic ulcer of  other part of right foot with fat layer exposed Physician Procedures CPT4: Description Modifier Quantity Code [id number] 11042 - WC PHYS SUBQ TISS 20 SQ CM 1 ICD-10 Description Diagnosis S31.104A Unspecified open wound of abdominal wall, left lower quadrant without penetration into peritoneal cavity, initial encounter  L97.512 Non-pressure chronic ulcer of other part of right foot with fat layer exposed FLORABELLE, CARDIN ([id number]) Electronic Signature(s) Signed: 04/01/2016 1:37:38 AM By: Lenda Kelp PA-C Entered By: Lenda Kelp on 03/31/2016 23:09:38

## 2016-04-07 ENCOUNTER — Encounter: Payer: No Typology Code available for payment source | Attending: Physician Assistant | Admitting: Physician Assistant

## 2016-04-07 DIAGNOSIS — L97512 Non-pressure chronic ulcer of other part of right foot with fat layer exposed: Secondary | ICD-10-CM | POA: Diagnosis not present

## 2016-04-07 DIAGNOSIS — E11621 Type 2 diabetes mellitus with foot ulcer: Secondary | ICD-10-CM | POA: Diagnosis not present

## 2016-04-07 DIAGNOSIS — Z794 Long term (current) use of insulin: Secondary | ICD-10-CM | POA: Diagnosis not present

## 2016-04-07 DIAGNOSIS — E1122 Type 2 diabetes mellitus with diabetic chronic kidney disease: Secondary | ICD-10-CM | POA: Insufficient documentation

## 2016-04-07 DIAGNOSIS — Z992 Dependence on renal dialysis: Secondary | ICD-10-CM | POA: Diagnosis not present

## 2016-04-07 DIAGNOSIS — N186 End stage renal disease: Secondary | ICD-10-CM | POA: Insufficient documentation

## 2016-04-07 DIAGNOSIS — I132 Hypertensive heart and chronic kidney disease with heart failure and with stage 5 chronic kidney disease, or end stage renal disease: Secondary | ICD-10-CM | POA: Diagnosis not present

## 2016-04-07 DIAGNOSIS — E1151 Type 2 diabetes mellitus with diabetic peripheral angiopathy without gangrene: Secondary | ICD-10-CM | POA: Insufficient documentation

## 2016-04-07 DIAGNOSIS — I482 Chronic atrial fibrillation: Secondary | ICD-10-CM | POA: Insufficient documentation

## 2016-04-07 DIAGNOSIS — Z8673 Personal history of transient ischemic attack (TIA), and cerebral infarction without residual deficits: Secondary | ICD-10-CM | POA: Diagnosis not present

## 2016-04-07 DIAGNOSIS — I5033 Acute on chronic diastolic (congestive) heart failure: Secondary | ICD-10-CM | POA: Insufficient documentation

## 2016-04-07 DIAGNOSIS — S31104A Unspecified open wound of abdominal wall, left lower quadrant without penetration into peritoneal cavity, initial encounter: Secondary | ICD-10-CM | POA: Insufficient documentation

## 2016-04-07 DIAGNOSIS — X58XXXA Exposure to other specified factors, initial encounter: Secondary | ICD-10-CM | POA: Diagnosis not present

## 2016-04-08 NOTE — Progress Notes (Signed)
MERRIL, ISAKSON ([id number]) Visit Report for 04/07/2016 Arrival Information Details Patient Name: [name], [name]. Date of Service: 04/07/2016 2:15 PM Medical Record Number: [id number] Patient Account Number: [id number] Date of Birth/Sex: [birth year] (82 [name] Female) Treating RN: Ashok Cordia, Debi Primary Care Physician: [name] Other Clinician: Referring Physician: [name] Treating Physician/Extender: Linwood Dibbles, HOYT Weeks in Treatment: 16 Visit Information History Since Last Visit All ordered tests and consults were completed: No Patient Arrived: Wheel Chair Added or deleted any medications: No Arrival Time: 14:42 Any new allergies or adverse reactions: No Accompanied By: daughter Had a fall or experienced change in No Transfer Assistance: EasyPivot Patient activities of daily living that may affect Lift risk of falls: Patient Identification Verified: Yes Signs or symptoms of abuse/neglect since last No Secondary Verification Process Yes visito Completed: Hospitalized since last visit: No Patient Requires Transmission- No Pain Present Now: No Based Precautions: Patient Has Alerts: Yes Patient Alerts: Patient on Blood Thinner DMII Warfarin ABI [place] Bilateral NO BP RIGHT ARM Electronic Signature(s) Signed: 04/07/2016 5:42:16 PM By: Alejandro Mulling Entered By: Alejandro Mulling on 04/07/2016 14:44:02 [name] ([id number]) -------------------------------------------------------------------------------- Encounter Discharge Information Details Patient Name: [name]. Date of Service: 04/07/2016 2:15 PM Medical Record Number: [id number] Patient Account Number: [id number] Date of Birth/Sex: 02-01-1934 (82 [name] Female) Treating RN: Ashok Cordia, Debi Primary Care Physician: [name] Other Clinician: Referring Physician: [name] Treating Physician/Extender: Linwood Dibbles, HOYT Weeks in Treatment: 69 Encounter Discharge Information  Items Discharge Pain Level: 0 Discharge Condition: Stable Ambulatory Status: Wheelchair Discharge Destination: Nursing Home Transportation: Other Accompanied By: daughter Schedule Follow-up Appointment: Yes Medication Reconciliation completed and provided to Patient/Care Yes Loredana [place]: Provided on Clinical Summary of Care: 04/07/2016 Form Type Recipient Paper Patient [facility] Electronic Signature(s) Signed: 04/07/2016 3:54:39 PM By: Gwenlyn Perking Entered By: Gwenlyn Perking on 04/07/2016 15:54:38 [name] ([id number]) -------------------------------------------------------------------------------- Lower Extremity Assessment Details Patient Name: [name] Date of Service: 04/07/2016 2:15 PM Medical Record Number: [id number] Patient Account Number: [id number] Date of Birth/Sex: [birth year] (82 [name] Female) Treating RN: Ashok Cordia, Debi Primary Care Physician: [name] Other Clinician: Referring Physician: [name] Treating Physician/Extender: Linwood Dibbles, HOYT Weeks in Treatment: 16 Vascular Assessment Pulses: Posterior Tibial Dorsalis Pedis Palpable: [Left:Yes] [Right:No] Doppler: [Right:Monophasic] Extremity colors, hair growth, and conditions: Extremity Color: [Left:Hyperpigmented] [Right:Hyperpigmented] Temperature of Extremity: [Left:Warm] [Right:Warm] Capillary Refill: [Left:< 3 seconds] [Right:< 3 seconds] Toe Nail Assessment Left: Right: Thick: No No Discolored: No No Deformed: No No Improper Length and Hygiene: No No Electronic Signature(s) Signed: 04/07/2016 5:42:16 PM By: Alejandro Mulling Entered By: Alejandro Mulling on 04/07/2016 14:48:35 [name] ([id number]) -------------------------------------------------------------------------------- Multi Wound Chart Details Patient Name: [name] Date of Service: 04/07/2016 2:15 PM Medical Record Number: [id number] Patient Account Number: [id number] Date of Birth/Sex: 04-20-1934 (82  [name] Female) Treating RN: Ashok Cordia, Debi Primary Care Physician: [name] Other Clinician: Referring Physician: [name] Treating Physician/Extender: Linwood Dibbles, HOYT Weeks in Treatment: 16 Vital Signs Height(in): 65 Pulse(bpm): 82 Weight(lbs): 160 Blood Pressure 116/79 (mmHg): Body Mass Index(BMI): 27 Temperature(F): 97.2 Respiratory Rate 18 (breaths/min): Photos: [11:No Photos] [14:No Photos] [9:No Photos] Wound Location: [11:Abdomen - Lower Quadrant - Midline] [14:Right Amputation Site - Digit] [9:Right Achilles] Wounding Event: [11:Trauma] [14:Surgical Injury] [9:Gradually Appeared] 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] [14:Open Surgical Wound] [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: [11:N/A] [14:N/A] [9:Arterial Insufficiency Ulcer] Comorbid History: [11:Arrhythmia, Congestive Heart Failure, Hypertension, Type II Diabetes] [14:Arrhythmia, Congestive Heart Failure, Hypertension, Type II Diabetes] [9:Arrhythmia, Congestive Heart Failure, Hypertension, Type II Diabetes]  Date Acquired: [11:08/12/2015] [14:11/11/2015] [9:06/02/2015] Weeks of Treatment: [11:16] [14:14] [9:16] Wound Status: [11:Open] [14:Open] [9:Open] Pending Amputation on No [14:No] [9:Yes] Presentation: Measurements L x W x D 0.2x0.6x0.2 [14:0.2x0.2x0.3] [9:10.4x3x0.2] (cm) Area (cm) : [11:0.094] [14:0.031] [9:24.504] Volume (cm) : [11:0.019] [14:0.009] [9:4.901] % Reduction in Area: [11:99.80%] [14:95.90%] [9:24.80%] % Reduction in Volume: 100.00% [14:88.00%] [9:49.90%] Position 1 (o'clock): [9:6] Maximum Distance 1 [9:0.3] (cm): Position 2 (o'clock): [9:12] Maximum Distance 2 [9:3.4] (cm): Tunneling: [11:No] [14:No] [9:Yes] Classification: [9:Grade 2] Full Thickness Without Full Thickness Without Exposed Support Exposed Support Structures Structures Exudate Amount: Medium Large Large Exudate Type: Serosanguineous Serous Serosanguineous Exudate Color: red,  brown amber red, brown Foul Odor After No No Yes Cleansing: Odor Anticipated Due to N/A N/A No Product Use: Wound Margin: Flat and Intact Flat and Intact Thickened Granulation Amount: Large (67-100%) Large (67-100%) Medium (34-66%) Granulation Quality: Red, Pink Pink Red Necrotic Amount: Small (1-33%) Small (1-33%) Medium (34-66%) Necrotic Tissue: Adherent Slough Adherent Liberty MediaSlough Eschar, Adherent Slough Exposed Structures: Fascia: No Fascia: No Tendon: Yes Fat: No Fat: No Fascia: No Tendon: No Tendon: No Fat: No Muscle: No Muscle: No Muscle: No Joint: No Joint: No Joint: No Bone: No Bone: No Bone: No Limited to Skin Limited to Skin Breakdown Breakdown Epithelialization: None None None Periwound Skin Texture: Edema: No Edema: No Edema: No Excoriation: No Excoriation: No Excoriation: No Induration: No Induration: No Induration: No Callus: No Callus: No Callus: No Crepitus: No Crepitus: No Crepitus: No Fluctuance: No Fluctuance: No Fluctuance: No Friable: No Friable: No Friable: No Rash: No Rash: No Rash: No Scarring: No Scarring: No Scarring: No Periwound Skin Moist: Yes Maceration: Yes Maceration: Yes Moisture: Maceration: No Moist: Yes Moist: Yes Dry/Scaly: No Dry/Scaly: No Dry/Scaly: No Periwound Skin Color: Erythema: Yes Atrophie Blanche: No Erythema: Yes Atrophie Blanche: No Cyanosis: No Atrophie Blanche: No Cyanosis: No Ecchymosis: No Cyanosis: No Ecchymosis: No Erythema: No Ecchymosis: No Hemosiderin Staining: No Hemosiderin Staining: No Hemosiderin Staining: No Mottled: No Mottled: No Mottled: No Pallor: No Pallor: No Pallor: No Rubor: No Rubor: No Rubor: No Erythema Location: Circumferential N/A Circumferential Temperature: No Abnormality No Abnormality N/A Tenderness on Yes No Yes Palpation: Wound Preparation: Ulcer Cleansing: Ulcer Cleansing: Ulcer Cleansing: Rinsed/Irrigated with Rinsed/Irrigated with  Rinsed/Irrigated with [name], Natalie Y. ([id number]) Saline Saline Saline Topical Anesthetic Topical Anesthetic Topical Anesthetic Applied: Other: Applied: None, Other: Applied: Other: lidocaine lidocaine4% lidocaine 4% 4% Treatment Notes Electronic Signature(s) Signed: 04/07/2016 5:42:16 PM By: Alejandro MullingPinkerton, Debra Entered By: Alejandro MullingPinkerton, Debra on 04/07/2016 15:04:12 [name], Natalie Y. ([id number]) -------------------------------------------------------------------------------- Multi-Disciplinary Care Plan Details Patient Name: [name], Natalie Y. Date of Service: 04/07/2016 2:15 PM Medical Record Number: [id number] Patient Account Number: [id number] Date of Birth/Sex: 05/08/1934 (82 [name] Female) Treating RN: Ashok CordiaPinkerton, Debi Primary Care Physician: Rolm GalaGrandis, Heidi Other Clinician: Referring Physician: Rolm GalaGrandis, Heidi Treating Physician/Extender: Linwood DibblesSTONE III, HOYT Weeks in Treatment: 516 Active Inactive Abuse / Safety / Falls / Self Care Management Nursing Diagnoses: Impaired physical mobility Potential for falls Goals: Patient will remain injury free Date Initiated: 12/17/2015 Goal Status: Active Interventions: Assess fall risk on admission and as needed Notes: Necrotic Tissue Nursing Diagnoses: Impaired tissue integrity related to necrotic/devitalized tissue Knowledge deficit related to management of necrotic/devitalized tissue Goals: Necrotic/devitalized tissue will be minimized in the wound bed Date Initiated: 02/04/2016 Goal Status: Active Interventions: Assess patient pain level pre-, during and post procedure and prior to discharge Provide education on necrotic tissue and debridement process Treatment Activities: Apply topical anesthetic as ordered : 02/04/2016 Notes: Wound/Skin Impairment [name],  [name] ([id number]) Nursing Diagnoses: Impaired tissue integrity Goals: Patient/caregiver will verbalize understanding of skin care regimen Date Initiated: 12/17/2015 Goal Status:  Active Ulcer/skin breakdown will have a volume reduction of 30% by week 4 Date Initiated: 12/17/2015 Goal Status: Active Ulcer/skin breakdown will have a volume reduction of 50% by week 8 Date Initiated: 12/17/2015 Goal Status: Active Ulcer/skin breakdown will have a volume reduction of 80% by week 12 Date Initiated: 12/17/2015 Goal Status: Active Ulcer/skin breakdown will heal within 14 weeks Date Initiated: 12/17/2015 Goal Status: Active Interventions: Assess patient/caregiver ability to obtain necessary supplies Assess patient/caregiver ability to perform ulcer/skin care regimen upon admission and as needed Assess ulceration(s) every visit Notes: Electronic Signature(s) Signed: 04/07/2016 5:42:16 PM By: Alejandro Mulling Entered By: Alejandro Mulling on 04/07/2016 15:29:49 [name] ([id number]) -------------------------------------------------------------------------------- Pain Assessment Details Patient Name: [name] Date of Service: 04/07/2016 2:15 PM Medical Record Number: [id number] Patient Account Number: [id number] Date of Birth/Sex: Apr 19, 1934 (82 [name] Female) Treating RN: Ashok Cordia, Debi Primary Care Physician: [name] Other Clinician: Referring Physician: [name] Treating Physician/Extender: Linwood Dibbles, HOYT Weeks in Treatment: 16 Active Problems Location of Pain Severity and Description of Pain Patient Has Paino No Site Locations With Dressing Change: No Pain Management and Medication Current Pain Management: Electronic Signature(s) Signed: 04/07/2016 5:42:16 PM By: Alejandro Mulling Entered By: Alejandro Mulling on 04/07/2016 14:44:41 [name] ([id number]) -------------------------------------------------------------------------------- Patient/Caregiver Education Details Patient Name: [name] Date of Service: 04/07/2016 2:15 PM Medical Record Number: [id number] Patient Account Number: [id number] Date of Birth/Gender:  [birth year] (82 [name] Female) Treating RN: Ashok Cordia, Debi Primary Care Physician: [name] Other Clinician: Referring Physician: [name] Treating Physician/Extender: Skeet Simmer in Treatment: 16 Education Assessment Education Provided To: Patient Education Topics Provided Wound/Skin Impairment: Handouts: Other: change dressing as ordered Methods: Demonstration, Explain/Verbal Responses: State content correctly Electronic Signature(s) Signed: 04/07/2016 5:42:16 PM By: Alejandro Mulling Entered By: Alejandro Mulling on 04/07/2016 15:06:11 [name] ([id number]) -------------------------------------------------------------------------------- Wound Assessment Details Patient Name: [name] Date of Service: 04/07/2016 2:15 PM Medical Record Number: [id number] Patient Account Number: [id number] Date of Birth/Sex: 1934/05/22 (82 [name] Female) Treating RN: Ashok Cordia, Debi Primary Care Physician: [name] Other Clinician: Referring Physician: [name] Treating Physician/Extender: Linwood Dibbles, HOYT Weeks in Treatment: 16 Wound Status Wound Number: 11 Primary Calciphylaxis Etiology: Wound Location: Abdomen - Lower Quadrant - Midline Wound Open Status: Wounding Event: Trauma Comorbid Arrhythmia, Congestive Heart Failure, Date Acquired: 08/12/2015 History: Hypertension, Type II Diabetes Weeks Of Treatment: 16 Clustered Wound: No Photos Photo Uploaded By: Alejandro Mulling on 04/07/2016 17:30:08 Wound Measurements Length: (cm) 0.2 Width: (cm) 0.6 Depth: (cm) 0.2 Area: (cm) 0.094 Volume: (cm) 0.019 % Reduction in Area: 99.8% % Reduction in Volume: 100% Epithelialization: None Tunneling: No Undermining: No Wound Description Full Thickness Without Exposed Classification: Support Structures Wound Margin: Flat and Intact Exudate Medium Amount: Exudate Type: Serosanguineous Exudate Color: red, brown Foul Odor After Cleansing:  No Wound Bed Granulation Amount: Large (67-100%) Exposed Structure Granulation Quality: Red, Pink Fascia Exposed: No CARLISA, EBLE ([id number]) Necrotic Amount: Small (1-33%) Fat Layer Exposed: No Necrotic Quality: Adherent Slough Tendon Exposed: No Muscle Exposed: No Joint Exposed: No Bone Exposed: No Limited to Skin Breakdown Periwound Skin Texture Texture Color No Abnormalities Noted: No No Abnormalities Noted: No Callus: No Atrophie Blanche: No Crepitus: No Cyanosis: No Excoriation: No Ecchymosis: No Fluctuance: No Erythema: Yes Friable: No Erythema Location: Circumferential Induration: No Hemosiderin Staining: No Localized Edema: No Mottled: No Rash: No Pallor:  No Scarring: No Rubor: No Moisture Temperature / Pain No Abnormalities Noted: No Temperature: No Abnormality Dry / Scaly: No Tenderness on Palpation: Yes Maceration: No Moist: Yes Wound Preparation Ulcer Cleansing: Rinsed/Irrigated with Saline Topical Anesthetic Applied: Other: lidocaine4%, Treatment Notes Wound #11 (Midline Abdomen - Lower Quadrant) 1. Cleansed with: Clean wound with Normal Saline 2. Anesthetic Topical Lidocaine 4% cream to wound bed prior to debridement 3. Peri-wound Care: Skin Prep 4. Dressing Applied: Aquacel Ag 5. Secondary Dressing Applied Bordered Foam Dressing Notes Polymem Silver Electronic Signature(s) Signed: 04/07/2016 5:42:16 PM By: Adair Laundry ([id number]) Entered By: Alejandro Mulling on 04/07/2016 14:56:10 [name] ([id number]) -------------------------------------------------------------------------------- Wound Assessment Details Patient Name: [name] Date of Service: 04/07/2016 2:15 PM Medical Record Number: [id number] Patient Account Number: [id number] Date of Birth/Sex: 07-Dec-1933 (82 [name] Female) Treating RN: Ashok Cordia, Debi Primary Care Physician: [name] Other Clinician: Referring Physician:  [name] Treating Physician/Extender: Linwood Dibbles, HOYT Weeks in Treatment: 16 Wound Status Wound Number: 14 Primary Open Surgical Wound Etiology: Wound Location: Right Amputation Site - Digit Wound Open Wounding Event: Surgical Injury Status: Date Acquired: 11/11/2015 Comorbid Arrhythmia, Congestive Heart Failure, Weeks Of Treatment: 14 History: Hypertension, Type II Diabetes Clustered Wound: No Photos Photo Uploaded By: Alejandro Mulling on 04/07/2016 17:29:34 Wound Measurements Length: (cm) 0.2 Width: (cm) 0.2 Depth: (cm) 0.3 Area: (cm) 0.031 Volume: (cm) 0.009 % Reduction in Area: 95.9% % Reduction in Volume: 88% Epithelialization: None Tunneling: No Undermining: No Wound Description Full Thickness Without Exposed Classification: Support Structures Wound Margin: Flat and Intact Exudate Large Amount: Exudate Type: Serous Exudate Color: amber Foul Odor After Cleansing: No Wound Bed Granulation Amount: Large (67-100%) Exposed Structure Granulation Quality: Pink Fascia Exposed: No Necrotic Amount: Small (1-33%) Fat Layer Exposed: No GRACY, EHLY ([id number]) Necrotic Quality: Adherent Slough Tendon Exposed: No Muscle Exposed: No Joint Exposed: No Bone Exposed: No Limited to Skin Breakdown Periwound Skin Texture Texture Color No Abnormalities Noted: No No Abnormalities Noted: No Callus: No Atrophie Blanche: No Crepitus: No Cyanosis: No Excoriation: No Ecchymosis: No Fluctuance: No Erythema: No Friable: No Hemosiderin Staining: No Induration: No Mottled: No Localized Edema: No Pallor: No Rash: No Rubor: No Scarring: No Temperature / Pain Moisture Temperature: No Abnormality No Abnormalities Noted: No Dry / Scaly: No Maceration: Yes Moist: Yes Wound Preparation Ulcer Cleansing: Rinsed/Irrigated with Saline Topical Anesthetic Applied: None, Other: lidocaine 4%, Treatment Notes Wound #14 (Right Amputation Site - Digit) 1.  Cleansed with: Clean wound with Normal Saline 2. Anesthetic Topical Lidocaine 4% cream to wound bed prior to debridement 3. Peri-wound Care: Skin Prep 4. Dressing Applied: Aquacel Ag Notes band-aide Electronic Signature(s) Signed: 04/07/2016 5:42:16 PM By: Alejandro Mulling Entered By: Alejandro Mulling on 04/07/2016 14:57:17 [name] ([id number]) -------------------------------------------------------------------------------- Wound Assessment Details Patient Name: [name] Date of Service: 04/07/2016 2:15 PM Medical Record Number: [id number] Patient Account Number: [id number] Date of Birth/Sex: 09-28-1933 (82 [name] Female) Treating RN: Phillis Haggis Primary Care Physician: [name] Other Clinician: Referring Physician: [name] Treating Physician/Extender: Linwood Dibbles, HOYT Weeks in Treatment: 16 Wound Status Wound Number: 16 Primary Diabetic Wound/Ulcer of the Lower Etiology: Extremity Wound Location: Toe Fifth Wound Open Wounding Event: Not Known Status: Date Acquired: 04/07/2016 Comorbid Arrhythmia, Congestive Heart Failure, Weeks Of Treatment: 0 History: Hypertension, Type II Diabetes Clustered Wound: No Photos Photo Uploaded By: Alejandro Mulling on 04/07/2016 17:29:35 Wound Measurements Length: (cm) 0.6 Width: (cm) 0.6 Depth: (cm) 0.1 Area: (cm) 0.283 Volume: (cm) 0.028 % Reduction in  Area: % Reduction in Volume: Epithelialization: None Tunneling: No Undermining: No Wound Description Classification: Grade 1 Wound Margin: Distinct, outline attached Exudate Amount: Large Exudate Type: Serosanguineous Exudate Color: red, brown Foul Odor After Cleansing: No Wound Bed Granulation Amount: Large (67-100%) Exposed Structure Granulation Quality: Red Fascia Exposed: No Necrotic Amount: None Present (0%) Fat Layer Exposed: No Tendon Exposed: No [name], Natalie Y. ([id number]) Muscle Exposed: No Joint Exposed: No Bone Exposed:  No Limited to Skin Breakdown Periwound Skin Texture Texture Color No Abnormalities Noted: No No Abnormalities Noted: No Moisture Temperature / Pain No Abnormalities Noted: No Temperature: No Abnormality Moist: Yes Tenderness on Palpation: Yes Wound Preparation Ulcer Cleansing: Rinsed/Irrigated with Saline Topical Anesthetic Applied: None Electronic Signature(s) Signed: 04/07/2016 5:42:16 PM By: Alejandro MullingPinkerton, Debra Entered By: Alejandro MullingPinkerton, Debra on 04/07/2016 15:28:01 [name], Natalie Y. ([id number]) -------------------------------------------------------------------------------- Wound Assessment Details Patient Name: [name], Natalie Y. Date of Service: 04/07/2016 2:15 PM Medical Record Number: [id number] Patient Account Number: [id number] Date of Birth/Sex: [birth year] (82 [name] Female) Treating RN: Ashok CordiaPinkerton, Debi Primary Care Physician: Rolm GalaGrandis, Heidi Other Clinician: Referring Physician: Rolm GalaGrandis, Heidi Treating Physician/Extender: Linwood DibblesSTONE III, HOYT Weeks in Treatment: 16 Wound Status Wound Number: 9 Primary Diabetic Wound/Ulcer of the Lower Etiology: Extremity Wound Location: Right Achilles Secondary Arterial Insufficiency Ulcer Wounding Event: Gradually Appeared Etiology: Date Acquired: 06/02/2015 Wound Open Weeks Of Treatment: 16 Status: Clustered Wound: No Comorbid Arrhythmia, Congestive Heart Pending Amputation On Presentation History: Failure, Hypertension, Type II Diabetes Photos Photo Uploaded By: Alejandro MullingPinkerton, Debra on 04/07/2016 17:30:10 Wound Measurements Length: (cm) 10.4 Width: (cm) 3 Depth: (cm) 0.2 Area: (cm) 24.504 Volume: (cm) 4.901 % Reduction in Area: 24.8% % Reduction in Volume: 49.9% Epithelialization: None Tunneling: Yes Location 1 Position (o'clock): 6 Maximum Distance: (cm) 0.3 Location 2 Position (o'clock): 12 Maximum Distance: (cm) 3.4 Wound Description Classification: Grade 2 Wound Margin: Thickened Exudate Amount: Large [name], Jamiracle Y.  ([id number]) Foul Odor After Cleansing: Yes Due to Product Use: No Exudate Type: Serosanguineous Exudate Color: red, brown Wound Bed Granulation Amount: Medium (34-66%) Exposed Structure Granulation Quality: Red Fascia Exposed: No Necrotic Amount: Medium (34-66%) Fat Layer Exposed: No Necrotic Quality: Eschar, Adherent Slough Tendon Exposed: Yes Muscle Exposed: No Joint Exposed: No Bone Exposed: No Periwound Skin Texture Texture Color No Abnormalities Noted: No No Abnormalities Noted: No Callus: No Atrophie Blanche: No Crepitus: No Cyanosis: No Excoriation: No Ecchymosis: No Fluctuance: No Erythema: Yes Friable: No Erythema Location: Circumferential Induration: No Hemosiderin Staining: No Localized Edema: No Mottled: No Rash: No Pallor: No Scarring: No Rubor: No Moisture Temperature / Pain No Abnormalities Noted: No Tenderness on Palpation: Yes Dry / Scaly: No Maceration: Yes Moist: Yes Wound Preparation Ulcer Cleansing: Rinsed/Irrigated with Saline Topical Anesthetic Applied: Other: lidocaine 4%, Treatment Notes Wound #9 (Right Achilles) 1. Cleansed with: Clean wound with Normal Saline 2. Anesthetic Topical Lidocaine 4% cream to wound bed prior to debridement 4. Dressing Applied: Aquacel Ag 5. Secondary Dressing Applied Dry Gauze Foam Kerlix/Conform [name], Celines Y. ([id number]) 7. Secured with Tape Notes [employer] Electronic Signature(s) Signed: 04/07/2016 5:42:16 PM By: Alejandro MullingPinkerton, Debra Entered By: Alejandro MullingPinkerton, Debra on 04/07/2016 15:02:27 [name], Bryla Y. ([id number]) -------------------------------------------------------------------------------- Vitals Details Patient Name: [name], Shamicka Y. Date of Service: 04/07/2016 2:15 PM Medical Record Number: [id number] Patient Account Number: [id number] Date of Birth/Sex: [birth year] (82 [name] Female) Treating RN: Ashok CordiaPinkerton, Debi Primary Care Physician: Rolm GalaGrandis, Heidi Other Clinician: Referring Physician:  Rolm GalaGrandis, Heidi Treating Physician/Extender: Linwood DibblesSTONE III, HOYT Weeks in Treatment: 16 Vital Signs Time Taken: 14:44 Temperature (F): 97.2 Height (in): 65 Pulse (bpm):  82 Weight (lbs): 160 Respiratory Rate (breaths/min): 18 Body Mass Index (BMI): 26.6 Blood Pressure (mmHg): 116/79 Reference Range: 80 - 120 mg / dl Electronic Signature(s) Signed: 04/07/2016 5:42:16 PM By: Alejandro Mulling Entered By: Alejandro Mulling on 04/07/2016 14:47:47

## 2016-04-08 NOTE — Progress Notes (Signed)
SHAWNTA, SCHLEGEL ([id number]) Visit Report for 04/07/2016 Chief Complaint Document Details Patient Name: Natalie, [name]. Date of Service: 04/07/2016 2:15 PM Medical Record Number: [id number] Patient Account Number: [id number] Date of Birth/Sex: May 14, 1934 ([name]) Treating RN: Ashok Cordia, Debi Primary Care Physician: [name] Other Clinician: Referring Physician: [name] Treating Physician/Extender: Linwood Dibbles, HOYT Weeks in Treatment: 16 Information Obtained from: Patient Chief Complaint Patient is seen today for evaluation concerning her ongoing lower abdominal wound midline to left as well as her left heel wound and her right Achilles wound. Electronic Signature(s) Signed: 04/08/2016 12:52:14 AM By: Lenda Kelp PA-C Entered By: Lenda Kelp on 04/07/2016 19:33:23 Deandria, Klute Vinetta Bergamo ([id number]) -------------------------------------------------------------------------------- Debridement Details Patient Name: [name] Date of Service: 04/07/2016 2:15 PM Medical Record Number: [id number] Patient Account Number: [id number] Date of Birth/Sex: Apr 02, 1934 ([name]) Treating RN: Ashok Cordia, Debi Primary Care Physician: [name] Other Clinician: Referring Physician: [name] Treating Physician/Extender: Linwood Dibbles, HOYT Weeks in Treatment: 16 Debridement Performed for Wound #9 Right Achilles Assessment: Performed By: Physician STONE III, HOYT E., PA-C Debridement: Debridement Pre-procedure Yes - 15:29 Verification/Time Out Taken: Start Time: 15:30 Pain Control: Lidocaine 4% Topical Solution Level: Skin/Subcutaneous Tissue Total Area Debrided (L x 10.4 (cm) x 3 (cm) = 31.2 (cm) W): Tissue and other Viable, Non-Viable material debrided: Instrument: Curette Bleeding: Minimum Hemostasis Achieved: Pressure End Time: 15:35 Procedural Pain: 0 Post Procedural Pain: 0 Response to Treatment: Procedure was tolerated well Post  Debridement Measurements of Total Wound Length: (cm) 10.4 Width: (cm) 3 Depth: (cm) 0.2 Volume: (cm) 4.901 Character of Wound/Ulcer Post Requires Further Debridement Debridement: Severity of Tissue Post Debridement: Fat layer exposed Post Procedure Diagnosis Same as Pre-procedure Electronic Signature(s) Signed: 04/07/2016 5:42:16 PM By: Alejandro Mulling Signed: 04/08/2016 12:52:14 AM By: Lenda Kelp PA-C Entered By: Alejandro Mulling on 04/07/2016 15:32:12 MAKIA, BOSSI ([id number]) YEZENIA, FREDRICK ([id number]) -------------------------------------------------------------------------------- HPI Details Patient Name: [name] Date of Service: 04/07/2016 2:15 PM Medical Record Number: [id number] Patient Account Number: [id number] Date of Birth/Sex: [birth year] ([name]) Treating RN: Ashok Cordia, Debi Primary Care Physician: [name] Other Clinician: Referring Physician: [name] Treating Physician/Extender: Linwood Dibbles, HOYT Weeks in Treatment: 16 History of Present Illness Location: Currently patient has just remaining a wound on her lower midline abdominal region, right fourth toe amputation site, and right Achilles region. Quality: Patient continues to do well she tells me this week with minimal discomfort Severity: 1 out of 10 Timing: Pain in wound is Intermittent but for the most part is worse with dressing changes daily this is still minimal Context: The wounds appeared gradually over time Modifying Factors: Wound interventions which have been performed at [facility] skilled nursing facility seem to have been helpful at this point in time. Patient's daughter as well as patient are very encouraged with the help she has received from the facility. Associated Signs and Symptoms: Patient has congestive heart failure, chronic respiratory failure, end- stage renal disease for which she currently is in dialysis, atrial fibrillation, diabetes  mellitus HPI Description: 82 year old patient who is known to be diabetic, was referred to [place] by Dr. Gavin Potters for a right heel ulceration which she's had for a while. She was recently in hospital for a pneumonia and at that time and got delirious and was disoriented and sometime during this time developed a stage II ulcer on her right heel. Her past medical history is significant for bilateral pneumonia which was treated with injectable antibiotics and  then to oral Levaquin which he has completed. She also has acute on chronic diastolic CHF, acute on chronic respiratory failure, end-stage renal disease on hemodialysis, atrial fibrillation, recent stroke, diabetes mellitus. The patient and her son are poor historians but from what I understand she was admitted to the hospital with an acute vascular compromise of her right lower extremity and Dr. Wyn Quaker has done a surgical procedure and we are trying to obtain these notes. There are also some vascular workup done and we will try and obtain these notes. the injury to the left lower quadrant of abdomen and the suprapubic area have been there due to a bruise and have been there for several months and no intervention has been done. 10/11/2015 -- on review of the electronics records it was noted that the patient was admitted to the hospital on 09/14/2015 with peripheral vascular disease with claudication, end-stage renal disease, pressure ulcer, chronic atrial fibrillation. She was seen by Dr. Wyn Quaker who did her right lower extremity angiogram , angioplasty of the right anterior tibial artery and thrombolysis with TPA of the right popliteal artery, and thrombectomy. She was seen by Dr. Wyn Quaker during this past week and he was pleased with the progress. He did say that if he took her to the operating room for any procedure he would debride the abdominal wound under anesthesia. She was also seen by Dr. Ether Griffins the podiatrist who thought that she may lose her right  fourth toe at some stage may need an amputation of this. 10/21/2015 --patient known to Dr. Wyn Quaker and his last office visit from 10/04/2015 has been reviewed. 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 She also had a persistent ulceration of the right heel and markedly this area and a right third and fourth toe and a small scab on the calf but these are dry and seemed to be improving. Patient also has a left carotid endarterectomy and multiple interventions to a right brachiocephalic AV fistula. After the visit he had recommended noninvasive studies to recheck her revascularization. He was off the impression that she would likely lose the right fourth toe and the third toe was likely to heal. He was concerned about underlying muscle necrosis on her right heel and midfoot. 11/01/2015 -- an echo done in January of this year showed her left ventricular ejection fraction to be about 50-55%. The patient was seen by the PA and Dr. Driscilla Grammes office and the plan was to take her to the operating room soon to have a debridement under anesthesia for the abdominal wall wound, the Achilles tendon on the right leg and amputation of the right fourth toe. The daughter and the patient do not feel that they would be able to undergo hyperbaric oxygen therapy 5 days a week for 6 weeks. 12/17/15; this is a medically complex woman who I note was recently in this clinic however I was not involved with her care. She returns today with multiple wounds; a) she has a wound in the mid abdomen that is been there since March of this year. I note that she is been to the overall for debridement recently. The exact etiology of this wound is not really clear b) left lower quadrant abdominal wound had some sanguinous drainage when she came in here. The patient fell in January and thinks  this may have been secondary to a hematoma. c): The  patient has 3 wounds on her right leg including a small wound on the right mid calf, a large area over the Achilles which currently has a wound VAC for the last 6 weeks, also a smaller wound on the distal part of the right heel. As far as I understand most of these wounds are currently been dressed with's calcium alginate. According to her daughter the Achilles wound under the wound VAC is doing well d) the patient is had an amputation of her left fifth toe in January and the right fourth toe 6 weeks ago secondary to diabetic PAD e) the patient has chronic renal failure on dialysis for the last 2 years secondary to type 2 diabetes on insulin. The daughter's knowledge there is been no biopsy of the abdominal wounds given their current appearance and lack of undefined etiology at have to wonder about calciphylaxis. 12/18/15:Addendum; I [facility] I can see no relevant x-rays of the right heel. I note her arteriogram and revascularization of her right lower extremity in April 2017. She had debridement of both abdominal wounds and the right heel and Achilles wound on 11/07/15 at which time she had a right fourth toe ray amputation. The abdominal wounds were debridement again on 6/29. I do not see any relevant pathology of these abdominal wounds 12/24/15; culture I did of the drainage from the midline abdominal wound last week showed both Proteus and ampicillin sensitive enterococcus. I've given her a course of Augmentin adjusted on dialysis days. She has no specific complaints today. Been using Santyl to the abdominal wounds in the right leg wound and the wound VAC on the right Achilles which was initially prescribed by Dr. dew 12/31/15; I have done two punch biopsies of the large midline abdominal. 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 May have been a trauma component of the one on the left lower quadrant however the midline wound had  no such history. She has a large area on the right Achilles heel with a wound VAC prescribed by Dr. dew. A small wound on the right anterior leg.Marland Kitchen. UNFORTUNATELY she has 2 new wounds today. One on the left heel which is probably a pressure area. As well her previous amputation site of her right fourth toe has dehisced and now has a small wound with significant depth at the amputation site. 01/14/2016 -- she returns after 2 weeks and had had a punch biopsy of abdominal wound done the last visit -- had a biopsy of her midline abdominal wound done and the Pathology diagnosis is that of ulceration, necrosis and inflammation and negative for dysplasia and malignancy. Natalie RimaHOPKINS, Collie Y. ([id number]) 01/21/16. 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 In the meantime she has new wounds of the left heel the right fourth toe amputation site is opened up. The back is stopped to the right heel area. 01/28/16; the abdominal wounds continued to improve. The extensive wound on her right Achilles also looks stable except for the lower aspect of the wound where there is a large liquefied area that probes right down to her calcaneus. This cultured Proteus last week I have her on Augmentin and doxycycline 1. I think this is going to need a course of IV antibiotics and I will call dialysis. X-ray I did last week was negative, I think she is going to need an MRI 02/04/16; right heel MRI as before Saturday. Receiving I believe IV Rocephin at dialysis 02/11/16; as  it turns out the patient could not have a MRI as she has a bladder stimulator in place even though it is not currently in use since the beginning of this year. Although she has an allergy to IV contrast she apparently has done well with premedication so we will have to go for a CT scan with contrast. In the meantime she has had a fall now has a large skin tear on her left upper arm. She went  to the ER and they suggested Tegaderm over topical antibiotics 03/03/16 currently patient returns after having been hospitalized for 2 weeks and subsequently transferred to [facility] nursing facility. She actually seems to be doing excellent compared to even when we last saw her according to our nursing staff. Both patient and her daughter are extremely encouraged at how well she is presenting at this point in time. Overall the biggest issue is still the right Achilles area which is being managed at this point in time by Dr. Wyn Quaker. Patient is currently utilizing a wound VAC to that region. 03/17/16; patient is at [facility] nursing home still. Using Aquacel Ag to the wounds on her bilateral feet and still Prisma to the small open area on her abdomen. 03/31/16 at this point in time patient has been tolerating the dressing changes currently. She fortunately has no worsening of her symptoms although she tells me that the nurse who is caring for her at [facility] nursing facility decided that nothing was needed in regard to the lower abdominal wound from a dressing standpoint at this time. she is really not having significant discomfort or pain at this point she continues to have some tunneling in the proximal Achilles wound region. 04/07/16 patient continues to do well on evaluation today. Even the Achilles wound which has been the most tender is not giving her as much trouble. Unfortunately the PolyMem dressings that we utilize last week really did not seem to benefit her in particular. Obviously we will discontinue that at this point in time today. Electronic Signature(s) Signed: 04/08/2016 12:52:14 AM By: Lenda Kelp PA-C Entered By: Lenda Kelp on 04/07/2016 19:35:41 Sarabella, Caprio Vinetta Bergamo ([id number]) -------------------------------------------------------------------------------- Physical Exam Details Patient Name: [name] Date of Service: 04/07/2016 2:15 PM Medical Record Number:  [id number] Patient Account Number: [id number] Date of Birth/Sex: 1933/09/29 ([name]) Treating RN: Ashok Cordia, Debi Primary Care Physician: [name] Other Clinician: Referring Physician: [name] Treating Physician/Extender: STONE III, HOYT Weeks in Treatment: 16 Constitutional Well-nourished and well-hydrated in no acute distress. Respiratory normal breathing without difficulty. Cardiovascular 1+ dorsalis pedis/posterior tibialis pulses. no clubbing, cyanosis, edema, <3 sec cap refill. Psychiatric this patient is able to make decisions and demonstrates good insight into disease process. Alert and Oriented x 3. pleasant and cooperative. Notes Patient's abdominal wound midline to right appears to be doing somewhat better at this point in time today. There is no significant slough noted that could not be cleaned with a saline soakeed gauze. Patient's right fourth toe ampputation site appears smaller and again I was with a saline soaked gauze and small Q-tip able to clean out this wound without the need for debridement. However her Acchilles wound which is by far the largest did require some debridement today, large portion of this appears very clean and hhealthy following debridement still it has not tremendously changed since last week's evaluation. Electronic Signature(s) Signed: 04/08/2016 12:52:14 AM By: Lenda Kelp PA-C Entered By: Lenda Kelp on 04/07/2016 19:38:08 [name] ([id number]) --------------------------------------------------------------------------------  Physician Orders Details Patient Name: BRECKEN, WALTH. Date of Service: 04/07/2016 2:15 PM Medical Record Number: [id number] Patient Account Number: [id number] Date of Birth/Sex: 1933-10-21 ([name]) Treating RN: Ashok Cordia, Debi Primary Care Physician: [name] Other Clinician: Referring Physician: [name] Treating Physician/Extender: Linwood Dibbles, HOYT Weeks in  Treatment: 76 Verbal / Phone Orders: Yes Clinician: Ashok Cordia, Debi Read Back and Verified: Yes Diagnosis Coding Wound Cleansing o Clean wound with Normal Saline. Anesthetic o Topical Lidocaine 4% cream applied to wound bed prior to debridement - all wounds for clinic use only Primary Wound Dressing Wound #11 Midline Abdomen - Lower Quadrant o Aquacel Ag Wound #14 Right Amputation Site - Digit o Aquacel Ag - use some AqAg rope to pack into wound lightly. Wound #16 Right,Dorsal Toe Fifth o Aquacel Ag - use some AqAg rope to pack into wound lightly. Wound #9 Right Achilles o Aquacel Ag - also place some AqAg Rope into the tunnel at 12 o'clock lightly. Secondary Dressing Wound #11 Midline Abdomen - Lower Quadrant o Boardered Foam Dressing Wound #14 Right Amputation Site - Digit o Non-adherent pad - band-aide Wound #16 Right,Dorsal Toe Fifth o Non-adherent pad - band-aide Wound #9 Right Achilles o Conform/Kerlix o Foam Dressing Change Frequency Wound #11 Midline Abdomen - Lower Quadrant o Change dressing every other day. ANNAIS, CRAFTS ([id number]) Wound #14 Right Amputation Site - Digit o Change dressing every other day. Wound #16 Right,Dorsal Toe Fifth o Change dressing every other day. Wound #9 Right Achilles o Change dressing every other day. Follow-up Appointments Wound #11 Midline Abdomen - Lower Quadrant o Return Appointment in 1 week. Wound #14 Right Amputation Site - Digit o Return Appointment in 1 week. Wound #9 Right Achilles o Return Appointment in 1 week. Off-Loading Wound #9 Right Achilles o Other: - Float heels Electronic Signature(s) Signed: 04/07/2016 5:42:16 PM By: Alejandro Mulling Signed: 04/08/2016 12:52:14 AM By: Lenda Kelp PA-C Entered By: Alejandro Mulling on 04/07/2016 15:35:37 Evangelina, Delancey Vinetta Bergamo ([id number]) -------------------------------------------------------------------------------- Problem List  Details Patient Name: CIELA, MAHAJAN. Date of Service: 04/07/2016 2:15 PM Medical Record Number: [id number] Patient Account Number: [id number] Date of Birth/Sex: 1934/02/14 ([name]) Treating RN: Ashok Cordia, Debi Primary Care Physician: [name] Other Clinician: Referring Physician: [name] Treating Physician/Extender: Linwood Dibbles, HOYT Weeks in Treatment: 16 Active Problems ICD-10 Encounter Code Description Active Date Diagnosis S31.104A Unspecified open wound of abdominal wall, left lower 12/17/2015 Yes quadrant without penetration into peritoneal cavity, initial encounter L97.512 Non-pressure chronic ulcer of other part of right foot with 03/31/2016 Yes fat layer exposed E11.51 Type 2 diabetes mellitus with diabetic peripheral 12/17/2015 Yes angiopathy without gangrene E11.621 Type 2 diabetes mellitus with foot ulcer 12/17/2015 Yes Inactive Problems Resolved Problems Electronic Signature(s) Signed: 04/08/2016 12:52:14 AM By: Lenda Kelp PA-C Entered By: Lenda Kelp on 04/07/2016 19:33:02 [name] ([id number]) -------------------------------------------------------------------------------- Progress Note Details Patient Name: [name] Date of Service: 04/07/2016 2:15 PM Medical Record Number: [id number] Patient Account Number: [id number] Date of Birth/Sex: 1933-06-22 ([name]) Treating RN: Ashok Cordia, Debi Primary Care Physician: [name] Other Clinician: Referring Physician: [name] Treating Physician/Extender: Linwood Dibbles, HOYT Weeks in Treatment: 16 Subjective Chief Complaint Information obtained from Patient Patient is seen today for evaluation concerning her ongoing lower abdominal wound midline to left as well as her left heel wound and her right Achilles wound. History of Present Illness (HPI) The following HPI elements were documented for the patient's wound: Location: Currently patient has just remaining  a  wound on her lower midline abdominal region, right fourth toe amputation site, and right Achilles region. Quality: Patient continues to do well she tells me this week with minimal discomfort Severity: 1 out of 10 Timing: Pain in wound is Intermittent but for the most part is worse with dressing changes daily this is still minimal Context: The wounds appeared gradually over time Modifying Factors: [facility]ound [facility] seem to have been helpful at this point in time. Patient's daughter as well as patient are very encouraged with the help she has received from the facility. Associated Signs and Symptoms: Patient has congestive heart failure, chronic respiratory failure, end-stage renal disease for which she currently is in dialysis, atrial fibrillation, diabetes mellitus 82 year old patient who is known to be diabetic, was referred to us by Dr. Gavin PottersGrandis for a right heel ulceration which she's had for a while. She was recently in hospital for a pneumonia and at that time and got delirious and was disoriented and sometime during this time developed a stage II ulcer on her right heel. Her past medical history is significant for bilateral pneumonia which was treated with injectable antibiotics and then to oral Levaquin which he has completed. She also has acute on chronic diastolic CHF, acute on chronic respiratory failure, end-stage renal disease on hemodialysis, atrial fibrillation, recent stroke, diabetes mellitus. The patient and her son are poor historians but from what I understand she was admitted to the hospital with an acute vascular compromise of her right lower extremity and Dr. Wyn Quakerew has done a surgical procedure and we are trying to obtain these notes. There are also some vascular workup done and we will try and obtain these notes. the injury to the left lower quadrant of abdomen and the suprapubic area have been there due to a  bruise and have been there for several months and no intervention has been done. 10/11/2015 -- on review of the electronics records it was noted that the patient was admitted to the hospital on 09/14/2015 with peripheral vascular disease with claudication, end-stage renal disease, pressure ulcer, chronic atrial fibrillation. She was seen by Dr. Wyn Quakerew who did her right lower extremity angiogram , angioplasty of the right anterior tibial artery and thrombolysis with TPA of the right popliteal artery, and Natalie RimaHOPKINS, Santresa Y. ([id number]) thrombectomy. She was seen by Dr. Wyn Quakerew during this past week and he was pleased with the progress. He did say that if he took her to the operating room for any procedure he would debride the abdominal wound under anesthesia. She was also seen by Dr. Ether GriffinsFowler the podiatrist who thought that she may lose her right fourth toe at some stage may need an amputation of this. 10/21/2015 --patient known to Dr. Wyn Quakerew and his last office visit from 10/04/2015 has been reviewed. 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 She also had a persistent ulceration of the right heel and markedly this area and a right third and fourth toe and a small scab on the calf but these are dry and seemed to be improving. Patient also has a left carotid endarterectomy and multiple interventions to a right brachiocephalic AV fistula. After the visit he had recommended noninvasive studies to recheck her revascularization. He was off the impression that she would likely lose the right fourth toe and the third toe was likely to heal. He  was concerned about underlying muscle necrosis on her right heel and midfoot. 11/01/2015 -- an echo done in January of this year showed her left ventricular ejection fraction to be about 50-55%. The patient was seen by the PA and Dr. Driscilla Grammes office and the  plan was to take her to the operating room soon to have a debridement under anesthesia for the abdominal wall wound, the Achilles tendon on the right leg and amputation of the right fourth toe. The daughter and the patient do not feel that they would be able to undergo hyperbaric oxygen therapy 5 days a week for 6 weeks. 12/17/15; this is a medically complex woman who I note was recently in this clinic however I was not involved with her care. She returns today with multiple wounds; a) she has a wound in the mid abdomen that is been there since March of this year. I note that she is been to the overall for debridement recently. The exact etiology of this wound is not really clear b) left lower quadrant abdominal wound had some sanguinous drainage when she came in here. The patient fell in January and thinks this may have been secondary to a hematoma. c): The patient has 3 wounds on her right leg including a small wound on the right mid calf, a large area over the Achilles which currently has a wound VAC for the last 6 weeks, also a smaller wound on the distal part of the right heel. As far as I understand most of these wounds are currently been dressed with's calcium alginate. According to her daughter the Achilles wound under the wound VAC is doing well d) the patient is had an amputation of her left fifth toe in January and the right fourth toe 6 weeks ago secondary to diabetic PAD e) the patient has chronic renal failure on dialysis for the last 2 years secondary to type 2 diabetes on insulin. The daughter's knowledge there is been no biopsy of the abdominal wounds given their current appearance and lack of undefined etiology at have to wonder about calciphylaxis. 12/18/15:Addendum; I have reviewed [facility] I can see no relevant x-rays of the right heel. I note her arteriogram and revascularization of her right lower extremity in April 2017. She had debridement of both abdominal wounds  and the right heel and Achilles wound on 11/07/15 at which time she had a right fourth toe ray amputation. The abdominal wounds were debridement again on 6/29. I do not see any relevant pathology of these abdominal wounds 12/24/15; culture I did of the drainage from the midline abdominal wound last week showed both Proteus and ampicillin sensitive enterococcus. I've given her a course of Augmentin adjusted on dialysis days. She has no specific complaints today. Been using Santyl to the abdominal wounds in the right leg wound and the wound VAC on the right Achilles which was initially prescribed by Dr. Wyn Quaker [name] ([id number]) 12/31/15; I have done two punch biopsies of the large midline abdominal. 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 May have been a trauma component of the one on the left lower quadrant however the midline wound had no such history. She has a large area on the right Achilles heel with a wound VAC prescribed by Dr. dew. A small wound on the right anterior leg.Marland Kitchen UNFORTUNATELY she has 2 new wounds today. One on the left heel which is probably a pressure area. As well her previous amputation site of her right fourth toe has dehisced and now  has a small wound with significant depth at the amputation site. 01/14/2016 -- she returns after 2 weeks and had had a punch biopsy of abdominal wound done the last visit -- had a biopsy of her midline abdominal wound done and the Pathology diagnosis is that of ulceration, necrosis and inflammation and negative for dysplasia and malignancy. 01/21/16. 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 In the meantime she has new wounds of the left heel the right fourth toe amputation site is opened up. The back is stopped to the right heel area. 01/28/16; the abdominal wounds continued to improve. The extensive wound on her right Achilles also looks stable except for the lower aspect  of the wound where there is a large liquefied area that probes right down to her calcaneus. This cultured Proteus last week I have her on Augmentin and doxycycline 1. I think this is going to need a course of IV antibiotics and I will call dialysis. X-ray I did last week was negative, I think she is going to need an MRI 02/04/16; right heel MRI as before Saturday. Receiving I believe IV Rocephin at dialysis 02/11/16; as it turns out the patient could not have a MRI as she has a bladder stimulator in place even though it is not currently in use since the beginning of this year. Although she has an allergy to IV contrast she apparently has done well with premedication so we will have to go for a CT scan with contrast. In the meantime she has had a fall now has a large skin tear on her left upper arm. She went to the ER and they suggested Tegaderm over topical antibiotics 03/03/16 currently patient returns after having been hospitalized for 2 weeks and subsequently transferred to [facility] nursing facility. She actually seems to be doing excellent compared to even when we last saw her according to our nursing staff. Both patient and her daughter are extremely encouraged at how well she is presenting at this point in time. Overall the biggest issue is still the right Achilles area which is being managed at this point in time by Dr. Wyn Quaker. Patient is currently utilizing a wound VAC to that region. 03/17/16; patient is at [facility] nursing home still. Using Aquacel Ag to the wounds on her bilateral feet and still Prisma to the small open area on her abdomen. 03/31/16 at this point in time patient has been tolerating the dressing changes currently. She fortunately has no worsening of her symptoms although she tells me that the nurse who is caring for her at [facility] nursing facility decided that nothing was needed in regard to the lower abdominal wound from a dressing standpoint at this time. she is really not  having significant discomfort or pain at this point she continues to have some tunneling in the proximal Achilles wound region. 04/07/16 patient continues to do well on evaluation today. Even the Achilles wound which has been the most tender is not giving her as much trouble. Unfortunately the PolyMem dressings that we utilize last week really did not seem to benefit her in particular. Obviously we will discontinue that at this point in time today. Objective Constitutional Well-nourished and well-hydrated in no acute distress. ELLENA, KAMEN ([id number]) Vitals Time Taken: 2:44 PM, Height: 65 in, Weight: 160 lbs, BMI: 26.6, Temperature: 97.2 F, Pulse: 82 bpm, Respiratory Rate: 18 breaths/min, Blood Pressure: 116/79 mmHg. Respiratory normal breathing without difficulty. Cardiovascular  1+ dorsalis pedis/posterior tibialis pulses. no clubbing, cyanosis, edema, Psychiatric this patient is able to make decisions and demonstrates good insight into disease process. Alert and Oriented x 3. pleasant and cooperative. General Notes: Patient's abdominal wound midline to right appears to be doing somewhat better at this point in time today. There is no significant slough noted that could not be cleaned with a saline soakeed gauze. Patient's right fourth toe ampputation site appears smaller and again I was with a saline soaked gauze and small Q-tip able to clean out this wound without the need for debridement. However her Acchilles wound which is by far the largest did require some debridement today, large portion of this appears very clean and hhealthy following debridement still it has not tremendously changed since last week's evaluation. Integumentary (Hair, Skin) Wound #11 status is Open. Original cause of wound was Trauma. The wound is located on the Midline Abdomen - Lower Quadrant. The wound measures 0.2cm length x 0.6cm width x 0.2cm depth; 0.094cm^2 area and 0.019cm^3 volume. The wound is  limited to skin breakdown. There is no tunneling or undermining noted. There is a medium amount of serosanguineous drainage noted. The wound margin is flat and intact. There is large (67-100%) red, pink granulation within the wound bed. There is a small (1-33%) amount of necrotic tissue within the wound bed including Adherent Slough. The periwound skin appearance exhibited: Moist, Erythema. The periwound skin appearance did not exhibit: Callus, Crepitus, Excoriation, Fluctuance, Friable, Induration, Localized Edema, Rash, Scarring, Dry/Scaly, Maceration, Atrophie Blanche, Cyanosis, Ecchymosis, Hemosiderin Staining, Mottled, Pallor, Rubor. The surrounding wound skin color is noted with erythema which is circumferential. Periwound temperature was noted as No Abnormality. The periwound has tenderness on palpation. Wound #14 status is Open. Original cause of wound was Surgical Injury. The wound is located on the Right Amputation Site - Digit. The wound measures 0.2cm length x 0.2cm width x 0.3cm depth; 0.031cm^2 area and 0.009cm^3 volume. The wound is limited to skin breakdown. There is no tunneling or undermining noted. There is a large amount of serous drainage noted. The wound margin is flat and intact. There is large (67-100%) pink granulation within the wound bed. There is a small (1-33%) amount of necrotic tissue within the wound bed including Adherent Slough. The periwound skin appearance exhibited: Maceration, Moist. The periwound skin appearance did not exhibit: Callus, Crepitus, Excoriation, Fluctuance, Friable, Induration, Localized Edema, Rash, Scarring, Dry/Scaly, Atrophie Blanche, Cyanosis, Ecchymosis, Hemosiderin Staining, Mottled, Pallor, Rubor, Erythema. Periwound temperature was noted as No Abnormality. Wound #16 status is Open. Original cause of wound was Not Known. The wound is located on the Right,Dorsal Toe Fifth. The wound measures 0.6cm length x 0.6cm width x 0.1cm depth;  0.283cm^2 area and 0.028cm^3 volume. The wound is limited to skin breakdown. There is no tunneling or undermining noted. There is a large amount of serosanguineous drainage noted. The wound margin is distinct with the EREKA, BRAU ([id number]) outline attached to the wound base. There is large (67-100%) red granulation within the wound bed. There is no necrotic tissue within the wound bed. The periwound skin appearance exhibited: Moist. Periwound temperature was noted as No Abnormality. The periwound has tenderness on palpation. Wound #9 status is Open. Original cause of wound was Gradually Appeared. The wound is located on the Right Achilles. The wound measures 10.4cm length x 3cm width x 0.2cm depth; 24.504cm^2 area and 4.901cm^3 volume. There is tendon exposed. There is tunneling at 6:00 with a maximum distance of 0.3cm. There is  additional tunneling and at 12:00 with a maximum distance of 3.4cm. There is a large amount of serosanguineous drainage noted. The wound margin is thickened. There is medium (34-66%) red granulation within the wound bed. There is a medium (34-66%) amount of necrotic tissue within the wound bed including Eschar and Adherent Slough. The periwound skin appearance exhibited: Maceration, Moist, Erythema. The periwound skin appearance did not exhibit: Callus, Crepitus, Excoriation, Fluctuance, Friable, Induration, Localized Edema, Rash, Scarring, Dry/Scaly, Atrophie Blanche, Cyanosis, Ecchymosis, Hemosiderin Staining, Mottled, Pallor, Rubor. The surrounding wound skin color is noted with erythema which is circumferential. The periwound has tenderness on palpation. Assessment Active Problems ICD-10 S31.104A - Unspecified open wound of abdominal wall, left lower quadrant without penetration into peritoneal cavity, initial encounter L97.512 - Non-pressure chronic ulcer of other part of right foot with fat layer exposed E11.51 - Type 2 diabetes mellitus with diabetic  peripheral angiopathy without gangrene E11.621 - Type 2 diabetes mellitus with foot ulcer Procedures Wound #9 Wound #9 is a Diabetic Wound/Ulcer of the Lower Extremity located on the Right Achilles . There was a Skin/Subcutaneous Tissue Debridement ([id number]) debridement with total area of 31.2 sq cm performed by STONE III, HOYT E., PA-C. with the following instrument(s): Curette to remove Viable and Non-Viable tissue/material after achieving pain control using Lidocaine 4% Topical Solution. A time out was conducted at 15:29, prior to the start of the procedure. A Minimum amount of bleeding was controlled with Pressure. The procedure was tolerated well with a pain level of 0 throughout and a pain level of 0 following the procedure. Post Debridement Measurements: 10.4cm length x 3cm width x 0.2cm depth; 4.901cm^3 volume. Character of Wound/Ulcer Post Debridement requires further debridement. Severity of Tissue Post Debridement is: Fat layer exposed. Post procedure Diagnosis Wound #9: Same as Pre-Procedure YASHA, TIBBETT ([id number]) Plan Wound Cleansing: Clean wound with Normal Saline. Anesthetic: Topical Lidocaine 4% cream applied to wound bed prior to debridement - all wounds for clinic use only Primary Wound Dressing: Wound #11 Midline Abdomen - Lower Quadrant: Aquacel Ag Wound #14 Right Amputation Site - Digit: Aquacel Ag - use some AqAg rope to pack into wound lightly. Wound #16 Right,Dorsal Toe Fifth: Aquacel Ag - use some AqAg rope to pack into wound lightly. Wound #9 Right Achilles: Aquacel Ag - also place some AqAg Rope into the tunnel at 12 o'clock lightly. Secondary Dressing: Wound #11 Midline Abdomen - Lower Quadrant: Boardered Foam Dressing Wound #14 Right Amputation Site - Digit: Non-adherent pad - band-aide Wound #16 Right,Dorsal Toe Fifth: Non-adherent pad - band-aide Wound #9 Right Achilles: Conform/Kerlix Foam Dressing Change Frequency: Wound #11 Midline  Abdomen - Lower Quadrant: Change dressing every other day. Wound #14 Right Amputation Site - Digit: Change dressing every other day. Wound #16 Right,Dorsal Toe Fifth: Change dressing every other day. Wound #9 Right Achilles: Change dressing every other day. Follow-up Appointments: Wound #11 Midline Abdomen - Lower Quadrant: Return Appointment in 1 week. Wound #14 Right Amputation Site - Digit: Return Appointment in 1 week. Wound #9 Right Achilles: Return Appointment in 1 week. Off-Loading: Wound #9 Right Achilles: Other: - Float heels TAMYIA, MINICH ([id number]) Follow-Up Appointments: A follow-up appointment should be scheduled. Medication Reconciliation completed and provided to Patient/Care Provider. A Patient Clinical Summary of Care was provided to [facility] At this point in time on the recommend that we apply silver alginate dressing to all locations. We will pack the 12:00 Tyunnel of the right Achilles wound as well. patient is in agreement wwith  this plan. I also think that this may be a good opportunity progressed to consider an Apligraf for the right Achilles region. She has had a wound VAC in the past and has made progress albeit very slow given time. With that being said seems to have stalled somewhat at this point. I think the Apligraaf could be beneficial simulating additional growth closure. Patient and her daughter are both in agreement with this plan. We will therefore place the order for the Apligraf to see about getting approval from insurance and once everything we can subsequently gett her scheduled for the application. We will see her for reevaluation in 1 week. Electronic Signature(s) Signed: 04/08/2016 12:52:14 AM By: Lenda Kelp PA-C Entered By: Lenda Kelp on 04/07/2016 19:40:52 Baeten, Vinetta Bergamo ([id number]) -------------------------------------------------------------------------------- SuperBill Details Patient Name: [name] Date of  Service: 04/07/2016 Medical Record Number: [id number] Patient Account Number: [id number] Date of Birth/Sex: [birth year] ([name]) Treating RN: Ashok Cordia, Debi Primary Care Physician: [name] Other Clinician: Referring Physician: [name] Treating Physician/Extender: Linwood Dibbles, HOYT Weeks in Treatment: 16 Diagnosis Coding ICD-10 Codes Code Description Unspecified open wound of abdominal wall, left lower quadrant without penetration into S31.104A peritoneal cavity, initial encounter L97.512 Non-pressure chronic ulcer of other part of right foot with fat layer exposed E11.51 Type 2 diabetes mellitus with diabetic peripheral angiopathy without gangrene E11.621 Type 2 diabetes mellitus with foot ulcer Facility Procedures CPT4 Code Description: [id number] 11042 - DEB SUBQ TISSUE 20 SQ CM/< ICD-10 Description Diagnosis L97.512 Non-pressure chronic ulcer of other part of right fo Modifier: ot with fat la Quantity: 1 yer exposed CPT4 Code Description: 30865784 11045 - DEB SUBQ TISS EA ADDL 20CM ICD-10 Description Diagnosis L97.512 Non-pressure chronic ulcer of other part of right fo Modifier: ot with fat la Quantity: 1 yer exposed Physician Procedures CPT4 Code Description: [id number] 11042 - WC PHYS SUBQ TISS 20 SQ CM ICD-10 Description Diagnosis L97.512 Non-pressure chronic ulcer of other part of right foo Modifier: t with fat lay Quantity: 1 er exposed CPT4 Code Description: 8413244 11045 - WC PHYS SUBQ TISS EA ADDL 20 CM ICD-10 Description Diagnosis L97.512 Non-pressure chronic ulcer of other part of right foo Modifier: t with fat lay Quantity: 1 er exposed Electronic Signature(s) RIELLE, SCHLAUCH ([id number]) Signed: 04/08/2016 12:52:14 AM By: Lenda Kelp PA-C Entered By: Lenda Kelp on 04/07/2016 19:41:17

## 2016-04-14 ENCOUNTER — Ambulatory Visit (INDEPENDENT_AMBULATORY_CARE_PROVIDER_SITE_OTHER): Payer: Medicare Other

## 2016-04-14 ENCOUNTER — Ambulatory Visit (INDEPENDENT_AMBULATORY_CARE_PROVIDER_SITE_OTHER): Payer: Medicare Other | Admitting: Vascular Surgery

## 2016-04-14 ENCOUNTER — Encounter (INDEPENDENT_AMBULATORY_CARE_PROVIDER_SITE_OTHER): Payer: Self-pay | Admitting: Vascular Surgery

## 2016-04-14 VITALS — BP 122/66 | HR 78 | Resp 16 | Ht 65.0 in | Wt 160.0 lb

## 2016-04-14 DIAGNOSIS — N186 End stage renal disease: Secondary | ICD-10-CM | POA: Diagnosis not present

## 2016-04-14 DIAGNOSIS — E119 Type 2 diabetes mellitus without complications: Secondary | ICD-10-CM

## 2016-04-14 DIAGNOSIS — I482 Chronic atrial fibrillation, unspecified: Secondary | ICD-10-CM

## 2016-04-14 DIAGNOSIS — Y841 Kidney dialysis as the cause of abnormal reaction of the patient, or of later complication, without mention of misadventure at the time of the procedure: Secondary | ICD-10-CM | POA: Diagnosis not present

## 2016-04-14 DIAGNOSIS — I6523 Occlusion and stenosis of bilateral carotid arteries: Secondary | ICD-10-CM

## 2016-04-14 DIAGNOSIS — I6319 Cerebral infarction due to embolism of other precerebral artery: Secondary | ICD-10-CM

## 2016-04-14 DIAGNOSIS — L89612 Pressure ulcer of right heel, stage 2: Secondary | ICD-10-CM | POA: Diagnosis not present

## 2016-04-14 DIAGNOSIS — I7025 Atherosclerosis of native arteries of other extremities with ulceration: Secondary | ICD-10-CM | POA: Diagnosis not present

## 2016-04-14 DIAGNOSIS — Z794 Long term (current) use of insulin: Secondary | ICD-10-CM

## 2016-04-14 NOTE — Progress Notes (Signed)
MRN : [id number]  ELICIA LUI is a 82 y.o. (June 01, 1934) female who presents with chief complaint of  Chief Complaint  Patient presents with  . Re-evaluation    Ultrasound follow up  .  History of Present Illness: Patient returns today in follow up of multiple vascular issues.  Her heel and foot wounds are markedly improving. She has undergone revascularization her abdominal wounds are also improving which is encouraging. For some time, these were quite infected and difficult to treat. Her biggest issue currently is her right arm AV fistula. This has become markedly more aneurysmal. The skin has become shiny and bleeding has become more common. Her duplex today demonstrates markedly elevated velocities at the cephalic vein subclavian vein confluence consistent with a high-grade stenosis. She also has markedly aneurysmal access segments with thrombus present.  Current Outpatient Prescriptions  Medication Sig Dispense Refill  . acetaminophen (TYLENOL) 650 MG CR tablet Take 1,300 mg by mouth 2 (two) times daily.    Marland Kitchen albuterol (PROVENTIL HFA;VENTOLIN HFA) 108 (90 Base) MCG/ACT inhaler Inhale 1-2 puffs into the lungs every 6 (six) hours as needed for wheezing or shortness of breath.    . Biotin 1 MG CAPS Take 1 mg by mouth daily.     . calcium acetate (PHOSLO) 667 MG tablet Take 1,334 mg by mouth 3 (three) times daily with meals. With any food    . calcium acetate, Phos Binder, (PHOSLYRA) 667 MG/5ML SOLN Take by mouth.    Geronimo Boot XT 120 MG 24 hr capsule     . ciprofloxacin (CIPRO) 500 MG tablet Take 1 tablet (500 mg total) by mouth daily at 8 pm. 30 tablet 0  . diltiazem (TIAZAC) 120 MG 24 hr capsule Take 120 mg by mouth every morning.     . docusate sodium (STOOL SOFTENER) 100 MG capsule Take by mouth.    . folic acid-vitamin b complex-vitamin c-selenium-zinc (DIALYVITE) 3 MG TABS tablet Take 1 tablet by mouth daily.    . furosemide (LASIX) 80 MG tablet Take 80 mg by mouth 2 (two) times  daily.    . furosemide (LASIX) 80 MG tablet Take by mouth.    . gabapentin (NEURONTIN) 100 MG capsule Take 100 mg by mouth at bedtime. Pt takes on Sunday,  Tuesday  ,Thursday and Saturday    . gabapentin (NEURONTIN) 300 MG capsule Take 300 mg by mouth at bedtime. Pt takes on  Monday, Wednesday, and Friday after dialysis.    Marland Kitchen insulin detemir (LEVEMIR) 100 UNIT/ML injection Inject 0.06 mLs (6 Units total) into the skin 2 (two) times daily. 10 mL 11  . lidocaine-prilocaine (EMLA) cream Apply 1 application topically as needed (prior to accessing port).     . Omega-3 Fatty Acids (FISH OIL) 1200 MG CAPS Take 1,200 mg by mouth at bedtime.     Marland Kitchen PARoxetine (PAXIL) 40 MG tablet Take 40 mg by mouth at bedtime.     . pravastatin (PRAVACHOL) 20 MG tablet Take 1 tablet (20 mg total) by mouth at bedtime. 30 tablet 0  . ranitidine (ZANTAC) 150 MG tablet Take 150 mg by mouth at bedtime.     . sevelamer carbonate (RENVELA) 800 MG tablet     . traMADol (ULTRAM) 50 MG tablet Take 1 tablet (50 mg total) by mouth every 6 (six) hours as needed. 50 tablet 0  . warfarin (COUMADIN) 3 MG tablet Take by mouth.     No current facility-administered medications for this visit.  Past Medical History:  Diagnosis Date  . Anemia   . Arthritis    gout  . Atrial fibrillation ([place])   . CHF (congestive heart failure) ([place])   . Chronic kidney disease   . Diabetes mellitus without complication ([place])   . Dialysis patient ([place])   . Dysrhythmia   . GERD (gastroesophageal reflux disease)   . Hypertension   . Peripheral vascular disease ([place])   . Pleural effusion   . Pulmonary hypertension   . Renal insufficiency   . Restless leg syndrome   . Shortness of breath dyspnea    with exertion  . Stroke [facility])     Past Surgical History:  Procedure Laterality Date  . AMPUTATION Right 11/07/2015   Procedure: AMPUTATION DIGIT ( 4TH TOE, RIGHT FOOT );  Surgeon: Algernon Huxley, MD;  Location: [facility] ORS;  Service: Vascular;   Laterality: Right;  . AMPUTATION TOE Left 06/07/2015   Procedure: AMPUTATION TOE;  Surgeon: Samara Deist, DPM;  Location: [facility] ORS;  Service: Podiatry;  Laterality: Left;  . CARDIAC CATHETERIZATION    . CATARACT EXTRACTION, BILATERAL    . CHOLECYSTECTOMY    . ENDARTERECTOMY Left 07/26/2015   Procedure: ENDARTERECTOMY CAROTID;  Surgeon: Katha Cabal, MD;  Location: [facility] OR[facility];  Service: Vascular;  Laterality: Left;  . EYE SURGERY    . FISTULAGRAM ([place] HX)    . INCISION AND DRAINAGE ABSCESS Right 02/16/2016   Procedure: INCISION AND DRAINAGE ABSCESS and application wound vac;  Surgeon: Evaristo Bury, MD;  Location: [facility] OR[facility];  Service: Urology;  Laterality: Right;  . JOINT REPLACEMENT     bilateral hip  . MEDTRONIC BLADDER INTERSTEM     currently turned off for MRI in January  . PARATHYROIDECTOMY    . PERIPHERAL VASCULAR CATHETERIZATION N/A 10/08/2014   Procedure: A/V Shuntogram/Fistulagram;  Surgeon: Algernon Huxley, MD;  Location: [place] [facility];  Service: Cardiovascular;  Laterality: N/A;  . PERIPHERAL VASCULAR CATHETERIZATION N/A 03/04/2015   Procedure: A/V Shuntogram/Fistulagram;  Surgeon: Algernon Huxley, MD;  Location: [place] [facility];  Service: Cardiovascular;  Laterality: N/A;  . PERIPHERAL VASCULAR CATHETERIZATION N/A 03/04/2015   Procedure: A/V Shunt Intervention;  Surgeon: Algernon Huxley, MD;  Location: [place] [facility];  Service: Cardiovascular;  Laterality: N/A;  . PERIPHERAL VASCULAR CATHETERIZATION N/A 09/16/2015   Procedure: Lower Extremity Angiography;  Surgeon: Algernon Huxley, MD;  Location: [place] [facility];  Service: Cardiovascular;  Laterality: N/A;  . PERIPHERAL VASCULAR CATHETERIZATION  09/16/2015   Procedure: Lower Extremity Intervention;  Surgeon: Algernon Huxley, MD;  Location: [place] [facility];  Service: Cardiovascular;;  . THYROIDECTOMY, PARTIAL    . TONSILLECTOMY    . WOUND DEBRIDEMENT Right 11/07/2015   Procedure: DEBRIDEMENT WOUND ( RIGHT HEEL AND ABDOMINAL  WOUND (2) );  Surgeon: Algernon Huxley, MD;  Location: [facility];  Service: Vascular;  Laterality: Right;  . WOUND DEBRIDEMENT N/A 11/28/2015   Procedure: DEBRIDEMENT WOUND ( ABDOMINAL );  Surgeon: Algernon Huxley, MD;  Location: [facility];  Service: Vascular;  Laterality: N/A;    Social History Social History  Substance Use Topics  . Smoking status: Never Smoker  . Smokeless tobacco: Never Used  . Alcohol use No  Currently in a facility  Family History Family History  Problem Relation Age of Onset  . Colon cancer Mother   . Stroke Father   . Diabetes Brother   No bleeding or clotting disorders  Allergies  Allergen Reactions  .  Codeine Other (See Comments)    Reaction:  Confusion/Hallucinations   . Contrast Media [Iodinated Diagnostic Agents] Other (See Comments)    "skin peel"  . Oxycodone Other (See Comments)    Reaction:  Confusion/Hallucinations   . Quinine Derivatives Rash     REVIEW OF SYSTEMS (Negative unless checked)  Constitutional: [] Weight loss  [] Fever  [] Chills Cardiac: [] Chest pain   [] Chest pressure   [x] Palpitations   [] Shortness of breath when laying flat   [] Shortness of breath at rest   [] Shortness of breath with exertion. Vascular:  [] Pain in legs with walking   [] Pain in legs at rest   [] Pain in legs when laying flat   [] Claudication   [] Pain in feet when walking  [] Pain in feet at rest  [] Pain in feet when laying flat   [] History of DVT   [] Phlebitis   [] Swelling in legs   [] Varicose veins   [x] Non-healing ulcers Pulmonary:   [] Uses home oxygen   [] Productive cough   [] Hemoptysis   [] Wheeze  [] COPD   [] Asthma Neurologic:  [] Dizziness  [] Blackouts   [] Seizures   [x] History of stroke   [] History of TIA  [] Aphasia   [] Temporary blindness   [] Dysphagia   [] Weakness or numbness in arms   [] Weakness or numbness in legs Musculoskeletal:  [x] Arthritis   [] Joint swelling   [] Joint pain   [] Low back pain Hematologic:  [] Easy bruising  [] Easy bleeding   [] Hypercoagulable  state   [] Anemic   Gastrointestinal:  [] Blood in stool   [] Vomiting blood  [] Gastroesophageal reflux/heartburn   [] Abdominal pain Genitourinary:  [x] Chronic kidney disease   [] Difficult urination  [] Frequent urination  [] Burning with urination   [] Hematuria Skin:  [] Rashes   [x] Ulcers   [x] Wounds Psychological:  [] History of anxiety   []  History of major depression.  Physical Examination  BP 122/66 (BP Location: Left Arm)   Pulse 78   Resp 16   Ht 5' 5"  (1.651 m)   Wt 160 lb (72.6 kg)   BMI 26.63 kg/m  Gen:  WD/WN, NAD. Somewhat debilitated appearing Head: [place]/AT, No temporalis wasting. Ear/Nose/Throat: Hearing grossly intact, nares w/o erythema or drainage, trachea midline Eyes: Conjunctiva clear. Sclera non-icteric Neck: Supple.  No JVD.  Pulmonary:  Good air movement, no use of accessory muscles.  Cardiac: Irregularly irregular Vascular:   Markedly aneurysmal access sites in the right arm AV fistula Vessel Right Left  Radial Palpable Palpable  Ulnar Palpable Palpable  Brachial Palpable Palpable  Carotid Palpable, without bruit Palpable, without bruit  Aorta Not palpable N/A  Femoral Palpable Palpable  Popliteal Palpable Palpable  PT Palpable 1+ Palpable  DP 1+ Palpable 1+ Palpable   Gastrointestinal: soft, non-tender/non-distended. No guarding/reflex.  Musculoskeletal: Wounds as described above. In a wheelchair. Neurologic: Sensation grossly intact in extremities.  Speech is fluent.  Psychiatric: Judgment intact, Mood & affect appropriate for pt's clinical situation. Dermatologic: Only about a 1-2 cm scab on the right lower abdomen wound. Right heel wound has a thick eschar that has markedly decreased in size and has no surrounding erythema or drainage. Toe amputation sites with some scab over them but no erythema or drainage.  Lymph : No Cervical, Axillary, or Inguinal lymphadenopathy.      Labs Recent Results (from the past 2160 hour(s))  Aerobic Culture  (superficial specimen)     Status: None   Collection Time: 01/21/16 10:49 AM  Result Value Ref Range   Specimen Description WOUND HEEL    Special Requests NONE  Gram Stain      NO WBC SEEN ABUNDANT GRAM NEGATIVE COCCOBACILLI MODERATE GRAM POSITIVE COCCI IN PAIRS Performed at [place] ABUNDANT ENTEROCOCCUS FAECALIS ABUNDANT ENTEROBACTER SPECIES    Report Status 01/27/2016 FINAL    Organism ID, Bacteria PROTEUS MIRABILIS    Organism ID, Bacteria ENTEROCOCCUS FAECALIS    Organism ID, Bacteria ENTEROBACTER SPECIES       Susceptibility   Enterococcus faecalis - MIC*    AMPICILLIN <=2 SENSITIVE Sensitive     VANCOMYCIN <=0.5 SENSITIVE Sensitive     GENTAMICIN SYNERGY SENSITIVE Sensitive     * ABUNDANT ENTEROCOCCUS FAECALIS   Enterobacter species - MIC*    CEFAZOLIN >=64 RESISTANT Resistant     CEFEPIME <=1 SENSITIVE Sensitive     CEFTAZIDIME <=1 SENSITIVE Sensitive     CEFTRIAXONE <=1 SENSITIVE Sensitive     CIPROFLOXACIN <=0.25 SENSITIVE Sensitive     GENTAMICIN <=1 SENSITIVE Sensitive     IMIPENEM <=0.25 SENSITIVE Sensitive     TRIMETH/SULFA <=20 SENSITIVE Sensitive     PIP/TAZO <=4 SENSITIVE Sensitive     * ABUNDANT ENTEROBACTER SPECIES   Proteus mirabilis - MIC*    AMPICILLIN <=2 SENSITIVE Sensitive     CEFAZOLIN <=4 SENSITIVE Sensitive     CEFEPIME <=1 SENSITIVE Sensitive     CEFTAZIDIME <=1 SENSITIVE Sensitive     CEFTRIAXONE <=1 SENSITIVE Sensitive     CIPROFLOXACIN 0.5 INTERMEDIATE Intermediate     GENTAMICIN <=1 SENSITIVE Sensitive     IMIPENEM 2 SENSITIVE Sensitive     TRIMETH/SULFA 40 SENSITIVE Sensitive     AMPICILLIN/SULBACTAM <=2 SENSITIVE Sensitive     PIP/TAZO <=4 SENSITIVE Sensitive     * ABUNDANT PROTEUS MIRABILIS  CBC with Differential     Status: Abnormal   Collection Time: 02/05/16  5:36 PM  Result Value Ref Range   WBC 10.1 3.6 - 11.0 K/uL   RBC 3.41 (L) 3.80 - 5.20 MIL/uL   Hemoglobin 10.1  (L) 12.0 - 16.0 g/dL   HCT 31.0 (L) 35.0 - 47.0 %   MCV 90.9 80.0 - 100.0 fL   MCH 29.4 26.0 - 34.0 pg   MCHC 32.4 32.0 - 36.0 g/dL   RDW 20.0 (H) 11.5 - 14.5 %   Platelets 272 150 - 440 K/uL   Neutrophils Relative % 82 %   Neutro Abs 8.3 (H) 1.4 - 6.5 K/uL   Lymphocytes Relative 7 %   Lymphs Abs 0.7 (L) 1.0 - 3.6 K/uL   Monocytes Relative 8 %   Monocytes Absolute 0.8 0.2 - 0.9 K/uL   Eosinophils Relative 2 %   Eosinophils Absolute 0.2 0 - 0.7 K/uL   Basophils Relative 1 %   Basophils Absolute 0.1 0 - 0.1 K/uL  Basic metabolic panel     Status: Abnormal   Collection Time: 02/05/16  5:36 PM  Result Value Ref Range   Sodium 131 (L) 135 - 145 mmol/L   Potassium 3.7 3.5 - 5.1 mmol/L   Chloride 97 (L) 101 - 111 mmol/L   CO2 23 22 - 32 mmol/L   Glucose, Bld 199 (H) 65 - 99 mg/dL   BUN 35 (H) 6 - 20 mg/dL   Creatinine, Ser 3.56 (H) 0.44 - 1.00 mg/dL   Calcium 8.2 (L) 8.9 - 10.3 mg/dL   GFR calc non Af Amer 11 (L) >60 mL/min   GFR calc Af Amer 13 (L) >60 mL/min  Comment: (NOTE) The eGFR has been calculated using the CKD EPI equation. This calculation has not been validated in all clinical situations. eGFR's persistently <60 mL/min signify possible Chronic Kidney Disease.    Anion gap 11 5 - 15  Protime-INR     Status: Abnormal   Collection Time: 02/05/16  5:36 PM  Result Value Ref Range   Prothrombin Time 29.9 (H) 11.4 - 15.2 seconds   INR 2.78   Aerobic Culture (superficial specimen)     Status: None   Collection Time: 02/11/16  8:50 AM  Result Value Ref Range   Specimen Description LEG    Special Requests NONE    Gram Stain      MODERATE WBC PRESENT,BOTH PMN AND MONONUCLEAR ABUNDANT GRAM NEGATIVE RODS FEW GRAM POSITIVE COCCI IN PAIRS Performed at [facility]    Culture      ABUNDANT ENTEROCOCCUS [place] FEW ENTEROBACTER CLOACAE    Report Status 02/15/2016 FINAL    Organism ID, Bacteria MORGANELLA  MORGANII    Organism ID, Bacteria ENTEROCOCCUS FAECALIS    Organism ID, Bacteria PROTEUS MIRABILIS    Organism ID, Bacteria ENTEROBACTER CLOACAE       Susceptibility   Enterobacter cloacae - MIC*    CEFAZOLIN >=64 RESISTANT Resistant     CEFEPIME <=1 SENSITIVE Sensitive     CEFTAZIDIME <=1 SENSITIVE Sensitive     CEFTRIAXONE <=1 SENSITIVE Sensitive     CIPROFLOXACIN <=0.25 SENSITIVE Sensitive     GENTAMICIN <=1 SENSITIVE Sensitive     IMIPENEM <=0.25 SENSITIVE Sensitive     TRIMETH/SULFA <=20 SENSITIVE Sensitive     PIP/TAZO <=4 SENSITIVE Sensitive     * FEW ENTEROBACTER CLOACAE   Enterococcus faecalis - MIC*    AMPICILLIN <=2 SENSITIVE Sensitive     VANCOMYCIN <=0.5 SENSITIVE Sensitive     GENTAMICIN SYNERGY RESISTANT Resistant     * ABUNDANT ENTEROCOCCUS FAECALIS   Morganella morganii - MIC*    AMPICILLIN >=32 RESISTANT Resistant     CEFAZOLIN >=64 RESISTANT Resistant     CEFEPIME <=1 SENSITIVE Sensitive     CEFTAZIDIME >=64 RESISTANT Resistant     CEFTRIAXONE 8 SENSITIVE Sensitive     CIPROFLOXACIN <=0.25 SENSITIVE Sensitive     GENTAMICIN <=1 SENSITIVE Sensitive     IMIPENEM 1 SENSITIVE Sensitive     TRIMETH/SULFA <=20 SENSITIVE Sensitive     AMPICILLIN/SULBACTAM >=32 RESISTANT Resistant     PIP/TAZO <=4 SENSITIVE Sensitive     * MODERATE MORGANELLA MORGANII   Proteus mirabilis - MIC*    AMPICILLIN <=2 SENSITIVE Sensitive     CEFAZOLIN <=4 SENSITIVE Sensitive     CEFEPIME <=1 SENSITIVE Sensitive     CEFTAZIDIME <=1 SENSITIVE Sensitive     CEFTRIAXONE <=1 SENSITIVE Sensitive     CIPROFLOXACIN <=0.25 SENSITIVE Sensitive     GENTAMICIN <=1 SENSITIVE Sensitive     IMIPENEM 1 SENSITIVE Sensitive     TRIMETH/SULFA <=20 SENSITIVE Sensitive     AMPICILLIN/SULBACTAM <=2 SENSITIVE Sensitive     PIP/TAZO <=4 SENSITIVE Sensitive     * MODERATE PROTEUS MIRABILIS  CBC     Status: Abnormal   Collection Time: 02/14/16  4:01 PM  Result Value Ref Range   WBC 9.3 3.6 - 11.0 K/uL    RBC 3.48 (L) 3.80 - 5.20 MIL/uL   Hemoglobin 10.2 (L) 12.0 - 16.0 g/dL   HCT 31.2 (L) 35.0 - 47.0 %   MCV 89.5 80.0 - 100.0 fL  MCH 29.3 26.0 - 34.0 pg   MCHC 32.8 32.0 - 36.0 g/dL   RDW 20.0 (H) 11.5 - 14.5 %   Platelets 263 150 - 440 K/uL  Comprehensive metabolic panel     Status: Abnormal   Collection Time: 02/14/16  4:01 PM  Result Value Ref Range   Sodium 136 135 - 145 mmol/L   Potassium 3.6 3.5 - 5.1 mmol/L   Chloride 98 (L) 101 - 111 mmol/L   CO2 24 22 - 32 mmol/L   Glucose, Bld 174 (H) 65 - 99 mg/dL   BUN 43 (H) 6 - 20 mg/dL   Creatinine, Ser 3.90 (H) 0.44 - 1.00 mg/dL   Calcium 8.2 (L) 8.9 - 10.3 mg/dL   Total Protein 8.2 (H) 6.5 - 8.1 g/dL   Albumin 3.3 (L) 3.5 - 5.0 g/dL   AST 39 15 - 41 U/L   ALT 21 14 - 54 U/L   Alkaline Phosphatase 82 38 - 126 U/L   Total Bilirubin 0.4 0.3 - 1.2 mg/dL   GFR calc non Af Amer 10 (L) >60 mL/min   GFR calc Af Amer 12 (L) >60 mL/min    Comment: (NOTE) The eGFR has been calculated using the CKD EPI equation. This calculation has not been validated in all clinical situations. eGFR's persistently <60 mL/min signify possible Chronic Kidney Disease.    Anion gap 14 5 - 15  Blood culture (routine x 2)     Status: None   Collection Time: 02/14/16  8:06 PM  Result Value Ref Range   Specimen Description BLOOD LEFT HAND    Special Requests BOTTLES DRAWN AEROBIC AND ANAEROBIC [place]    Culture NO GROWTH 5 DAYS    Report Status 02/19/2016 FINAL   Blood culture (routine x 2)     Status: None   Collection Time: 02/14/16  8:06 PM  Result Value Ref Range   Specimen Description BLOOD LEFT WRIST    Special Requests BOTTLES DRAWN AEROBIC AND ANAEROBIC [place]    Culture NO GROWTH 5 DAYS    Report Status 02/19/2016 FINAL   Lactic acid, plasma     Status: None   Collection Time: 02/14/16  8:06 PM  Result Value Ref Range   Lactic Acid, Venous 1.4 0.5 - 1.9 mmol/L  APTT     Status: Abnormal   Collection Time: 02/14/16 10:01 PM  Result Value Ref  Range   aPTT 51 (H) 24 - 36 seconds    Comment:        IF BASELINE aPTT IS ELEVATED, SUGGEST PATIENT RISK ASSESSMENT BE USED TO DETERMINE APPROPRIATE ANTICOAGULANT THERAPY.   Protime-INR     Status: Abnormal   Collection Time: 02/14/16 10:01 PM  Result Value Ref Range   Prothrombin Time 27.9 (H) 11.4 - 15.2 seconds   INR 2.55   Glucose, capillary     Status: None   Collection Time: 02/14/16 10:23 PM  Result Value Ref Range   Glucose-Capillary 89 65 - 99 mg/dL  CBC     Status: Abnormal   Collection Time: 02/15/16  4:20 AM  Result Value Ref Range   WBC 8.0 3.6 - 11.0 K/uL   RBC 3.11 (L) 3.80 - 5.20 MIL/uL   Hemoglobin 9.4 (L) 12.0 - 16.0 g/dL   HCT 28.2 (L) 35.0 - 47.0 %   MCV 90.6 80.0 - 100.0 fL   MCH 30.2 26.0 - 34.0 pg   MCHC 33.4 32.0 - 36.0 g/dL   RDW 19.5 (H)  11.5 - 14.5 %   Platelets 243 150 - 440 K/uL  Basic metabolic panel     Status: Abnormal   Collection Time: 02/15/16  4:20 AM  Result Value Ref Range   Sodium 138 135 - 145 mmol/L   Potassium 4.1 3.5 - 5.1 mmol/L   Chloride 102 101 - 111 mmol/L   CO2 24 22 - 32 mmol/L   Glucose, Bld 61 (L) 65 - 99 mg/dL   BUN 55 (H) 6 - 20 mg/dL   Creatinine, Ser 4.79 (H) 0.44 - 1.00 mg/dL   Calcium 7.7 (L) 8.9 - 10.3 mg/dL   GFR calc non Af Amer 8 (L) >60 mL/min   GFR calc Af Amer 9 (L) >60 mL/min    Comment: (NOTE) The eGFR has been calculated using the CKD EPI equation. This calculation has not been validated in all clinical situations. eGFR's persistently <60 mL/min signify possible Chronic Kidney Disease.    Anion gap 12 5 - 15  Protime-INR     Status: Abnormal   Collection Time: 02/15/16  4:20 AM  Result Value Ref Range   Prothrombin Time 26.0 (H) 11.4 - 15.2 seconds   INR 2.33   Glucose, capillary     Status: Abnormal   Collection Time: 02/15/16 11:29 AM  Result Value Ref Range   Glucose-Capillary 153 (H) 65 - 99 mg/dL  Glucose, capillary     Status: Abnormal   Collection Time: 02/15/16  4:48 PM  Result  Value Ref Range   Glucose-Capillary 115 (H) 65 - 99 mg/dL   Comment 1 Notify RN   Glucose, capillary     Status: Abnormal   Collection Time: 02/15/16  9:25 PM  Result Value Ref Range   Glucose-Capillary 116 (H) 65 - 99 mg/dL   Comment 1 Notify RN   Protime-INR     Status: Abnormal   Collection Time: 02/16/16  5:00 AM  Result Value Ref Range   Prothrombin Time 20.4 (H) 11.4 - 15.2 seconds   INR 1.72   CBC     Status: Abnormal   Collection Time: 02/16/16  5:00 AM  Result Value Ref Range   WBC 10.0 3.6 - 11.0 K/uL   RBC 3.40 (L) 3.80 - 5.20 MIL/uL   Hemoglobin 10.2 (L) 12.0 - 16.0 g/dL   HCT 30.8 (L) 35.0 - 47.0 %   MCV 90.5 80.0 - 100.0 fL   MCH 30.1 26.0 - 34.0 pg   MCHC 33.2 32.0 - 36.0 g/dL   RDW 19.3 (H) 11.5 - 14.5 %   Platelets 259 150 - 440 K/uL  Basic metabolic panel     Status: Abnormal   Collection Time: 02/16/16  5:00 AM  Result Value Ref Range   Sodium 137 135 - 145 mmol/L   Potassium 4.4 3.5 - 5.1 mmol/L   Chloride 100 (L) 101 - 111 mmol/L   CO2 22 22 - 32 mmol/L   Glucose, Bld 46 (L) 65 - 99 mg/dL   BUN 76 (H) 6 - 20 mg/dL   Creatinine, Ser 5.89 (H) 0.44 - 1.00 mg/dL   Calcium 8.0 (L) 8.9 - 10.3 mg/dL   GFR calc non Af Amer 6 (L) >60 mL/min   GFR calc Af Amer 7 (L) >60 mL/min    Comment: (NOTE) The eGFR has been calculated using the CKD EPI equation. This calculation has not been validated in all clinical situations. eGFR's persistently <60 mL/min signify possible Chronic Kidney Disease.    Anion gap 15  5 - 15  Glucose, capillary     Status: Abnormal   Collection Time: 02/16/16  8:05 AM  Result Value Ref Range   Glucose-Capillary 40 (LL) 65 - 99 mg/dL  Aerobic/Anaerobic Culture (surgical/deep wound)     Status: None   Collection Time: 02/16/16  8:40 AM  Result Value Ref Range   Specimen Description HEEL RIGHT    Special Requests NONE    Gram Stain      ABUNDANT WBC PRESENT, PREDOMINANTLY PMN FEW GRAM POSITIVE COCCI IN PAIRS FEW GRAM NEGATIVE  RODS RARE GRAM POSITIVE RODS    Culture      ABUNDANT ENTEROCOCCUS FAECALIS FEW PROTEUS MIRABILIS SUSCEPTIBILITIES PERFORMED ON PREVIOUS CULTURE WITHIN THE LAST 5 DAYS. NO ANAEROBES ISOLATED Performed at [facility]    Report Status 02/21/2016 FINAL   Aerobic/Anaerobic Culture (surgical/deep wound)     Status: None   Collection Time: 02/16/16  8:42 AM  Result Value Ref Range   Specimen Description HEEL RIGHT    Special Requests NONE    Gram Stain      ABUNDANT WBC PRESENT, PREDOMINANTLY PMN ABUNDANT GRAM POSITIVE COCCI IN PAIRS IN CLUSTERS MODERATE GRAM NEGATIVE RODS FEW GRAM POSITIVE RODS    Culture      ABUNDANT VANCOMYCIN RESISTANT ENTEROCOCCUS FEW PROTEUS MIRABILIS NO ANAEROBES ISOLATED Performed at [facility]    Report Status 02/21/2016 FINAL    Organism ID, Bacteria VANCOMYCIN RESISTANT ENTEROCOCCUS    Organism ID, Bacteria PROTEUS MIRABILIS       Susceptibility   Proteus mirabilis - MIC*    AMPICILLIN >=32 RESISTANT Resistant     CEFAZOLIN <=4 SENSITIVE Sensitive     CEFEPIME <=1 SENSITIVE Sensitive     CEFTAZIDIME <=1 SENSITIVE Sensitive     CEFTRIAXONE <=1 SENSITIVE Sensitive     CIPROFLOXACIN >=4 RESISTANT Resistant     GENTAMICIN <=1 SENSITIVE Sensitive     IMIPENEM 2 SENSITIVE Sensitive     TRIMETH/SULFA >=320 RESISTANT Resistant     AMPICILLIN/SULBACTAM 4 SENSITIVE Sensitive     PIP/TAZO <=4 SENSITIVE Sensitive     * FEW PROTEUS MIRABILIS   Vancomycin resistant enterococcus - MIC*    AMPICILLIN <=2 SENSITIVE Sensitive     VANCOMYCIN >=32 RESISTANT Resistant     GENTAMICIN SYNERGY RESISTANT Resistant     LINEZOLID 2 SENSITIVE Sensitive     * ABUNDANT VANCOMYCIN RESISTANT ENTEROCOCCUS  Glucose, capillary     Status: None   Collection Time: 02/16/16  9:09 AM  Result Value Ref Range   Glucose-Capillary 96 65 - 99 mg/dL  Glucose, capillary     Status: Abnormal   Collection Time: 02/16/16 11:26 AM  Result Value Ref Range    Glucose-Capillary 62 (L) 65 - 99 mg/dL  Glucose, capillary     Status: Abnormal   Collection Time: 02/16/16  1:19 PM  Result Value Ref Range   Glucose-Capillary 117 (H) 65 - 99 mg/dL  Glucose, capillary     Status: Abnormal   Collection Time: 02/16/16  4:57 PM  Result Value Ref Range   Glucose-Capillary 153 (H) 65 - 99 mg/dL  Glucose, capillary     Status: Abnormal   Collection Time: 02/16/16  9:22 PM  Result Value Ref Range   Glucose-Capillary 206 (H) 65 - 99 mg/dL   Comment 1 Notify RN   Protime-INR     Status: Abnormal   Collection Time: 02/17/16  4:13 AM  Result Value Ref Range   Prothrombin Time 20.6 (H) 11.4 -  15.2 seconds   INR 1.74   Glucose, capillary     Status: Abnormal   Collection Time: 02/17/16  8:45 AM  Result Value Ref Range   Glucose-Capillary 166 (H) 65 - 99 mg/dL  Glucose, capillary     Status: Abnormal   Collection Time: 02/17/16 11:31 AM  Result Value Ref Range   Glucose-Capillary 136 (H) 65 - 99 mg/dL   Comment 1 Notify RN   Glucose, capillary     Status: Abnormal   Collection Time: 02/17/16  8:40 PM  Result Value Ref Range   Glucose-Capillary 118 (H) 65 - 99 mg/dL  Protime-INR     Status: Abnormal   Collection Time: 02/18/16  5:04 AM  Result Value Ref Range   Prothrombin Time 20.2 (H) 11.4 - 15.2 seconds   INR 1.70   CBC     Status: Abnormal   Collection Time: 02/18/16  5:04 AM  Result Value Ref Range   WBC 8.5 3.6 - 11.0 K/uL   RBC 3.19 (L) 3.80 - 5.20 MIL/uL   Hemoglobin 9.6 (L) 12.0 - 16.0 g/dL   HCT 28.5 (L) 35.0 - 47.0 %   MCV 89.2 80.0 - 100.0 fL   MCH 30.2 26.0 - 34.0 pg   MCHC 33.8 32.0 - 36.0 g/dL   RDW 18.9 (H) 11.5 - 14.5 %   Platelets 217 150 - 440 K/uL  Glucose, capillary     Status: None   Collection Time: 02/18/16  7:27 AM  Result Value Ref Range   Glucose-Capillary 79 65 - 99 mg/dL    Radiology No results found.    Assessment/Plan  Cerebral infarction ([place]) No recent symptoms. In SNF  Carotid stenosis Previous  CEA  Chronic atrial fibrillation ([facility]) On anticoagulation  Diabetes mellitus ([place]) blood glucose control important in reducing the progression of atherosclerotic disease. Also, involved in wound healing. On appropriate medications.   Pressure ulcer Healing.  Doing better.  Has had revascularization which has helped.  Atherosclerosis of native arteries of the extremities with ulceration ([place]) S/p revascularization.  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 This may be amenable to covered stent placement to bridge it but I am not optimistic about that. We would not know that until the time of the fistulogram. At this point, I recommended a fistulogram for further evaluation and at least treatment of her cephalic vein subclavian vein confluence stenosis. We will make determinations on possible revision following this.    Leotis Pain, MD  04/14/2016 4:54 PM    This note was created with Dragon medical transcription system.  Any errors from dictation are purely unintentional

## 2016-04-14 NOTE — Assessment & Plan Note (Signed)
S/p revascularization.  Wound now healing

## 2016-04-14 NOTE — Patient Instructions (Signed)
AV Fistula Placement Introduction Arteriovenous (AV) fistula placement is a surgical procedure to create a connection between a blood vessel that carries blood away from your heart (artery) and a blood vessel that returns blood to your heart (vein). The connection is called a fistula. It is often made in the forearm or upper arm. You may need this procedure if you are getting hemodialysis treatments for kidney disease. An AV fistula makes your vein larger and stronger over several months. This makes the vein a safe and easy spot to insert the needles that are used for hemodialysis. Tell a health care provider about:  Any allergies you have.  All medicines you are taking, including vitamins, herbs, eye drops, creams, and over-the-counter medicines.  Any problems you or family members have had with anesthetic medicines.  Any blood disorders you have.  Any surgeries you have had.  Any medical conditions you have. What are the risks? Generally, this is a safe procedure. However, problems may occur, including:  Infection.  Blood clot (thrombosis).  Reduced blood flow (stenosis).  Weakening or ballooning out of the fistula (aneurysm).  Bleeding.  Allergic reactions to medicines.  Nerve damage.  Swelling near the fistula (lymphedema).  Weakening of your heart (congestive heart failure).  Failure of the procedure. What happens before the procedure?  Imaging tests of your arm may be done to find the best place for the fistula.  Ask your health care provider about:  Changing or stopping your regular medicines. This is especially important if you are taking diabetes medicines or blood thinners.  Taking medicines such as aspirin and ibuprofen. These medicines can thin your blood. Do not take these medicines before your procedure if your health care provider instructs you not to.  Follow instructions from your health care provider about eating or drinking restrictions.  You may  be given antibiotic medicine to help prevent infection.  Ask your health care provider how your surgical site will be marked or identified.  Plan to have someone take you home after the procedure. What happens during the procedure?  To reduce your risk of infection:  Your health care team will wash or sanitize their hands.  Your skin will be washed with soap.  Hair may be removed from the surgical area.  An IV tube will be started in one of your veins.  You will be given one or more of the following:  A medicine to help you relax (sedative).  A medicine to numb the area (local anesthetic).  A medicine to make you fall asleep (general anesthetic).  A medicine that is injected into an area of your body to numb everything below the injection site (regional anesthetic).  The fistula site will be cleaned with a germ-killing solution (antiseptic).  A cut (incision) will be made on the inner side of your arm.  A vein and an artery will be opened and connected with stitches (sutures).  The incision will be closed with sutures or clips.  A bandage (dressing) will be placed over the area. The procedure may vary among health care providers and hospitals. What happens after the procedure?  Your blood pressure, heart rate, breathing rate, and blood oxygen level will be monitored often until the medicines you were given have worn off.  Your fistula site will be checked for bleeding or swelling.  You will be given pain medication as needed.  Do not drive for 24 hours if you received a sedative. This information is not intended to replace   advice given to you by your health care provider. Make sure you discuss any questions you have with your health care provider. Document Released: 04/29/2015 Document Revised: 10/24/2015 Document Reviewed: 08/08/2014  2017 [employer]  

## 2016-04-14 NOTE — Assessment & Plan Note (Signed)
blood glucose control important in reducing the progression of atherosclerotic disease. Also, involved in wound healing. On appropriate medications.  

## 2016-04-14 NOTE — Assessment & Plan Note (Signed)
No recent symptoms. In SNF

## 2016-04-14 NOTE — Assessment & Plan Note (Signed)
On anticoagulation 

## 2016-04-14 NOTE — Assessment & Plan Note (Signed)
Previous CEA

## 2016-04-14 NOTE — Assessment & Plan Note (Signed)
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 This may be amenable to covered stent placement to bridge it but I am not optimistic about that. We would not know that until the time of the fistulogram. At this point, I recommended a fistulogram for further evaluation and at least treatment of her cephalic vein subclavian vein confluence stenosis. We will make determinations on possible revision following this.

## 2016-04-14 NOTE — Assessment & Plan Note (Signed)
Healing.  Doing better.  Has had revascularization which has helped.

## 2016-04-15 ENCOUNTER — Encounter: Payer: No Typology Code available for payment source | Admitting: Internal Medicine

## 2016-04-15 DIAGNOSIS — E11621 Type 2 diabetes mellitus with foot ulcer: Secondary | ICD-10-CM | POA: Diagnosis not present

## 2016-04-17 ENCOUNTER — Encounter (INDEPENDENT_AMBULATORY_CARE_PROVIDER_SITE_OTHER): Payer: Self-pay

## 2016-04-17 NOTE — Progress Notes (Signed)
[name], Esabella Y. ([id number]) Visit Report for 04/15/2016 Arrival Information Details Patient Name: [name], Natalie Y. Date of Service: 04/15/2016 8:00 AM Medical Record Number: [id number] Patient Account Number: [id number] Date of Birth/Sex: 07/26/1933 (82 [name] Female) Treating RN: Ashok CordiaPinkerton, Debi Primary Care Physician: Rolm GalaGrandis, Heidi Other Clinician: Referring Physician: Rolm GalaGrandis, Heidi Treating Physician/Extender: Kathreen Cosieroulter, Leah Weeks in Treatment: 17 Visit Information History Since Last Visit All ordered tests and consults were completed: No Patient Arrived: Wheel Chair Added or deleted any medications: No Arrival Time: 08:16 Any new allergies or adverse reactions: No Accompanied By: daughter Had a fall or experienced change in No Transfer Assistance: EasyPivot Patient activities of daily living that may affect Lift risk of falls: Patient Identification Verified: Yes Signs or symptoms of abuse/neglect since last No Secondary Verification Process Yes visito Completed: Hospitalized since last visit: No Patient Requires Transmission- No Pain Present Now: No Based Precautions: Patient Has Alerts: Yes Patient Alerts: Patient on Blood Thinner DMII Warfarin ABI [place] Bilateral NO BP RIGHT ARM Electronic Signature(s) Signed: 04/16/2016 4:54:36 PM By: Alejandro MullingPinkerton, Debra Entered By: Alejandro MullingPinkerton, Debra on 04/15/2016 09:47:22 [name], Natalie Y. ([id number]) -------------------------------------------------------------------------------- Encounter Discharge Information Details Patient Name: [name], Natalie Y. Date of Service: 04/15/2016 8:00 AM Medical Record Number: [id number] Patient Account Number: [id number] Date of Birth/Sex: 07/26/1933 (82 [name] Female) Treating RN: Phillis HaggisPinkerton, Debi Primary Care Physician: Rolm GalaGrandis, Heidi Other Clinician: Referring Physician: Rolm GalaGrandis, Heidi Treating Physician/Extender: Kathreen Cosieroulter, Leah Weeks in Treatment: 17 Encounter Discharge Information  Items Discharge Pain Level: 0 Discharge Condition: Stable Ambulatory Status: Wheelchair Discharge Destination: Nursing Home Transportation: Other Accompanied By: daughter Schedule Follow-up Appointment: Yes Medication Reconciliation completed and provided to Patient/Care Yes Dominyk Law: Provided on Clinical Summary of Care: 04/15/2016 Form Type [facility] Signed: 04/16/2016 4:54:36 PM By: Alejandro MullingPinkerton, Debra Previous Signature: 04/15/2016 9:17:01 AM Version By: Gwenlyn PerkingMoore, Shelia Entered By: Alejandro MullingPinkerton, Debra on 04/15/2016 09:54:11 [name], Aryella Y. ([id number]) -------------------------------------------------------------------------------- Lower Extremity Assessment Details Patient Name: [name], Natalie Y. Date of Service: 04/15/2016 8:00 AM Medical Record Number: [id number] Patient Account Number: [id number] Date of Birth/Sex: 07/26/1933 (82 [name] Female) Treating RN: Phillis HaggisPinkerton, Debi Primary Care Physician: Rolm GalaGrandis, Heidi Other Clinician: Referring Physician: Rolm GalaGrandis, Heidi Treating Physician/Extender: Kathreen Cosieroulter, Leah Weeks in Treatment: 17 Vascular Assessment Pulses: Posterior Tibial Dorsalis Pedis Palpable: [Left:Yes] [Right:No] Doppler: [Right:Monophasic] Extremity colors, hair growth, and conditions: Extremity Color: [Left:Hyperpigmented] [Right:Hyperpigmented] Temperature of Extremity: [Left:Warm] [Right:Warm] Capillary Refill: [Left:< 3 seconds] [Right:< 3 seconds] Toe Nail Assessment Left: Right: Thick: No No Discolored: No No Deformed: No No Improper Length and Hygiene: No No Electronic Signature(s) Signed: 04/16/2016 4:54:36 PM By: Alejandro MullingPinkerton, Debra Entered By: Alejandro MullingPinkerton, Debra on 04/15/2016 09:47:45 [name], Jazman Y. ([id number]) -------------------------------------------------------------------------------- Multi Wound Chart Details Patient Name: [name], Natalie Y. Date of Service: 04/15/2016 8:00 AM Medical Record Number:  [id number] Patient Account Number: [id number] Date of Birth/Sex: 07/26/1933 (82 [name] Female) Treating RN: Phillis HaggisPinkerton, Debi Primary Care Physician: Rolm GalaGrandis, Heidi Other Clinician: Referring Physician: Rolm GalaGrandis, Heidi Treating Physician/Extender: Kathreen Cosieroulter, Leah Weeks in Treatment: 17 Vital Signs Height(in): 65 Pulse(bpm): 66 Weight(lbs): 160 Blood Pressure 132/58 (mmHg): Body Mass Index(BMI): 27 Temperature(F): 97.2 Respiratory Rate 18 (breaths/min): Photos: [11:No Photos] [14:No Photos] [16:No Photos] Wound Location: [11:Abdomen - Lower Quadrant - Midline] [14:Right Amputation Site - Digit] [16:Right Toe Fifth - Dorsal] Wounding Event: [11:Trauma] [14:Surgical Injury] [16:Not Known] 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] [14:Open Surgical Wound] [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] Secondary Etiology: [11:N/A] [14:N/A] [16:N/A] Comorbid History: [11:Arrhythmia, Congestive Heart Failure, Hypertension, Type II Diabetes] [14:Arrhythmia, Congestive Heart Failure, Hypertension, Type II Diabetes] [16:Arrhythmia, Congestive  Heart Failure, Hypertension, Type II Diabetes] Date Acquired: [11:08/12/2015] [14:11/11/2015] [16:04/07/2016] Weeks of Treatment: [11:17] [14:15] [16:1] Wound Status: [11:Open] [14:Open] [16:Open] Pending Amputation on No [14:No] [16:No] Presentation: Measurements L x W x D 0.1x0.1x0.1 [14:0.1x0.1x0.1] [16:0x0x0] (cm) Area (cm) : [11:0.008] [14:0.008] [16:0] Volume (cm) : [11:0.001] [14:0.001] [16:0] % Reduction in Area: [11:100.00%] [14:98.90%] [16:100.00%] % Reduction in Volume: 100.00% [14:98.70%] [16:100.00%] Tunneling: [11:No] [14:No] [16:No] Classification: [11:Full Thickness Without Exposed Support Structures] [14:Full Thickness Without Exposed Support Structures] [16:Grade 1] Exudate Amount: [11:Small] [14:Small] [16:None Present] Exudate Type: [11:Serosanguineous] [14:Serosanguineous] [16:N/A] Exudate Color: [11:red, brown No] [14:red, brown No]  [16:N/A No] Foul Odor After Cleansing: Odor Anticipated Due to N/A N/A N/A Product Use: Wound Margin: Flat and Intact Flat and Intact Distinct, outline attached Granulation Amount: Large (67-100%) Large (67-100%) None Present (0%) Granulation Quality: Red, Pink Pink N/A Necrotic Amount: Small (1-33%) Small (1-33%) None Present (0%) Necrotic Tissue: Eschar Eschar N/A Exposed Structures: Fascia: No Fascia: No Fascia: No Fat: No Fat: No Fat: No Tendon: No Tendon: No Tendon: No Muscle: No Muscle: No Muscle: No Joint: No Joint: No Joint: No Bone: No Bone: No Bone: No Limited to Skin Limited to Skin Limited to Skin Breakdown Breakdown Breakdown Epithelialization: Medium (34-66%) Medium (34-66%) Large (67-100%) Debridement: N/A Debridement (16109- N/A 11047) Pre-procedure N/A 08:43 N/A Verification/Time Out Taken: Pain Control: N/A Lidocaine 4% Topical N/A Solution Tissue Debrided: N/A Fibrin/Slough, Exudates, N/A Subcutaneous Level: N/A Skin/Subcutaneous N/A Tissue Debridement Area (sq N/A 0.01 N/A cm): Instrument: N/A Curette N/A Bleeding: N/A Minimum N/A Hemostasis Achieved: N/A Pressure N/A Procedural Pain: N/A 0 N/A Post Procedural Pain: N/A 0 N/A Debridement Treatment N/A Procedure was tolerated N/A Response: well Post Debridement N/A 0.1x0.1x0.1 N/A Measurements L x W x D (cm) Post Debridement N/A 0.001 N/A Volume: (cm) Periwound Skin Texture: Edema: No Edema: No No Abnormalities Noted Excoriation: No Excoriation: No Induration: No Induration: No Callus: No Callus: No Crepitus: No Crepitus: No Fluctuance: No Fluctuance: No Friable: No Friable: No [name], [name] ([id number]) Rash: No Rash: No Scarring: No Scarring: No Periwound Skin Moist: Yes Dry/Scaly: Yes Dry/Scaly: Yes Moisture: Maceration: No Maceration: No Moist: No Dry/Scaly: No Moist: No Periwound Skin Color: Erythema: Yes Atrophie Blanche: No No Abnormalities  Noted Atrophie Blanche: No Cyanosis: No Cyanosis: No Ecchymosis: No Ecchymosis: No Erythema: No Hemosiderin Staining: No Hemosiderin Staining: No Mottled: No Mottled: No Pallor: No Pallor: No Rubor: No Rubor: No Erythema Location: Circumferential N/A N/A Temperature: No Abnormality No Abnormality No Abnormality Tenderness on Yes No Yes Palpation: Wound Preparation: Ulcer Cleansing: Ulcer Cleansing: Ulcer Cleansing: Rinsed/Irrigated with Rinsed/Irrigated with Rinsed/Irrigated with Saline Saline Saline Topical Anesthetic Topical Anesthetic Topical Anesthetic Applied: Other: Applied: None, Other: Applied: None lidocaine4% lidocaine 4% Procedures Performed: N/A Debridement N/A Wound Number: 9 N/A N/A Photos: No Photos N/A N/A Wound Location: Right Achilles N/A N/A Wounding Event: Gradually Appeared N/A N/A 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 ([id number]) Maximum Distance 1 (cm): Tunneling: Yes N/A N/A Classification:  Grade 2 N/A N/A Exudate Amount: Large N/A N/A Exudate Type: Serosanguineous N/A N/A Exudate Color: red, brown N/A N/A Foul Odor After Yes N/A N/A Cleansing: Odor Anticipated Due to No N/A N/A Product Use: Wound Margin: Thickened N/A N/A Granulation Amount: Medium (34-66%) N/A N/A Granulation Quality: Red N/A N/A Necrotic Amount: Medium (34-66%) N/A N/A Necrotic Tissue: Eschar, Adherent Slough N/A  N/A Exposed Structures: Tendon: Yes N/A N/A Fascia: No Fat: No Muscle: No Joint: No Bone: No Epithelialization: None N/A N/A Debridement: Open Wound/Selective N/A N/A ([id number]) - Selective Pre-procedure 08:43 N/A N/A Verification/Time Out Taken: Pain Control: Lidocaine 4% Topical N/A N/A Solution Tissue Debrided: Fibrin/Slough, Exudates, N/A N/A Subcutaneous Level: N/A N/A N/A Debridement Area (sq 21 N/A N/A cm): Instrument: Curette N/A N/A Bleeding: Minimum N/A N/A Hemostasis Achieved: Pressure N/A N/A Procedural Pain: 0 N/A N/A Post Procedural Pain: 0 N/A N/A Debridement Treatment Procedure was tolerated N/A N/A Response: well Post Debridement 10x2.1x0.3 N/A N/A Measurements L x W x D (cm) Post Debridement 4.948 N/A N/A Volume: (cm) Periwound Skin Texture: Edema: No N/A N/A Excoriation: No [name], Natalie Y. ([id number]) Induration: No Callus: No Crepitus: No Fluctuance: No Friable: No Rash: No Scarring: No Periwound Skin Maceration: Yes N/A N/A Moisture: Moist: Yes Dry/Scaly: No Periwound Skin Color: Erythema: Yes N/A N/A Atrophie Blanche: No Cyanosis: No Ecchymosis: No Hemosiderin Staining: No Mottled: No Pallor: No Rubor: No Erythema Location: Circumferential N/A N/A Temperature: N/A N/A N/A Tenderness on Yes N/A N/A Palpation: Wound Preparation: Ulcer Cleansing: N/A N/A Rinsed/Irrigated with Saline Topical Anesthetic Applied: Other: lidocaine 4% Procedures Performed: Debridement N/A N/A Treatment Notes Wound #11 (Midline Abdomen - Lower Quadrant) 1. Cleansed with: Clean wound with Normal Saline 2. Anesthetic Topical Lidocaine 4% cream to wound bed prior to debridement 3. Peri-wound Care: Skin Prep 4. Dressing Applied: Aquacel Ag 5. Secondary Dressing Applied Bordered Foam Dressing Notes Polymem Silver Wound #14 (Right Amputation Site - Digit) 1. Cleansed with: [name], Natalie Y. ([id number]) Clean wound with Normal  Saline 2. Anesthetic Topical Lidocaine 4% cream to wound bed prior to debridement 4. Dressing Applied: Aquacel Ag 5. Secondary Dressing Applied Non-Adherent pad Notes band-aide Wound #9 (Right Achilles) 1. Cleansed with: Clean wound with Normal Saline 2. Anesthetic Topical Lidocaine 4% cream to wound bed prior to debridement 4. Dressing Applied: Hydrafera Blue Iodoform packing Gauze 5. Secondary Dressing Applied ABD Pad Kerlix/Conform 7. Secured with Tape Notes netting Electronic Signature(s) Signed: 04/16/2016 4:54:36 PM By: Alejandro MullingPinkerton, Debra Entered By: Alejandro MullingPinkerton, Debra on 04/15/2016 09:50:50 [name], Leroy Y. ([id number]) -------------------------------------------------------------------------------- Multi-Disciplinary Care Plan Details Patient Name: [name], Joelee Y. Date of Service: 04/15/2016 8:00 AM Medical Record Number: [id number] Patient Account Number: [id number] Date of Birth/Sex: Dec 09, 1933 (82 [name] Female) Treating RN: Ashok CordiaPinkerton, Debi Primary Care Physician: Rolm GalaGrandis, Heidi Other Clinician: Referring Physician: Rolm GalaGrandis, Heidi Treating Physician/Extender: Kathreen Cosieroulter, Leah Weeks in Treatment: 2617 Active Inactive Abuse / Safety / Falls / Self Care Management Nursing Diagnoses: Impaired physical mobility Potential for falls Goals: Patient will remain injury free Date Initiated: 12/17/2015 Goal Status: Active Interventions: Assess fall risk on admission and as needed Notes: Necrotic Tissue Nursing Diagnoses: Impaired tissue integrity related to necrotic/devitalized tissue Knowledge deficit related to management of necrotic/devitalized tissue Goals: Necrotic/devitalized tissue will be minimized in the wound bed Date Initiated: 02/04/2016 Goal Status: Active Interventions: Assess patient pain level pre-, during and post procedure and prior to discharge Provide education on necrotic tissue and debridement process Treatment Activities: Apply topical  anesthetic as ordered : 02/04/2016 Notes: Wound/Skin Impairment Minjares,  Vinetta Bergamo ([id number]) Nursing Diagnoses: Impaired tissue integrity Goals: Patient/caregiver will verbalize understanding of skin care regimen Date Initiated: 12/17/2015 Goal Status: Active Ulcer/skin breakdown will have a volume reduction of 30% by week 4 Date Initiated: 12/17/2015 Goal Status: Active Ulcer/skin breakdown will have a volume reduction of 50% by week 8 Date Initiated: 12/17/2015 Goal Status: Active Ulcer/skin breakdown will have a volume reduction of 80% by week 12 Date Initiated: 12/17/2015 Goal Status: Active Ulcer/skin breakdown will heal within 14 weeks Date Initiated: 12/17/2015 Goal Status: Active Interventions: Assess patient/caregiver ability to obtain necessary supplies Assess patient/caregiver ability to perform ulcer/skin care regimen upon admission and as needed Assess ulceration(s) every visit Notes: Electronic Signature(s) Signed: 04/16/2016 4:54:36 PM By: Alejandro Mulling Entered By: Alejandro Mulling on 04/15/2016 09:50:31 Natalie Rima ([id number]) -------------------------------------------------------------------------------- Pain Assessment Details Patient Name: Natalie Rima Date of Service: 04/15/2016 8:00 AM Medical Record Number: [id number] Patient Account Number: [id number] Date of Birth/Sex: 1933/10/02 (82 [name] Female) Treating RN: Phillis Haggis Primary Care Physician: [name] Other Clinician: Referring Physician: [name] Treating Physician/Extender: Kathreen Cosier in Treatment: 17 Active Problems Location of Pain Severity and Description of Pain Patient Has Paino No Site Locations With Dressing Change: No Pain Management and Medication Current Pain Management: Electronic Signature(s) Signed: 04/16/2016 4:54:36 PM By: Alejandro Mulling Entered By: Alejandro Mulling on 04/15/2016 09:47:30 Natalie Rima  ([id number]) -------------------------------------------------------------------------------- Patient/Caregiver Education Details Patient Name: Natalie Rima. Date of Service: 04/15/2016 8:00 AM Medical Record Number: [id number] Patient Account Number: [id number] Date of Birth/Gender: [birth year] (82 [name] Female) Treating RN: Phillis Haggis Primary Care Physician: [name] Other Clinician: Referring Physician: [name] Treating Physician/Extender: Kathreen Cosier in Treatment: 17 Education Assessment Education Provided To: Patient and Caregiver Education Topics Provided Wound/Skin Impairment: Handouts: Other: change dressing as ordered Methods: Demonstration, Explain/Verbal Responses: State content correctly Electronic Signature(s) Signed: 04/16/2016 4:54:36 PM By: Alejandro Mulling Entered By: Alejandro Mulling on 04/15/2016 09:54:19 Natalie Rima ([id number]) -------------------------------------------------------------------------------- Wound Assessment Details Patient Name: Natalie Rima. Date of Service: 04/15/2016 8:00 AM Medical Record Number: [id number] Patient Account Number: [id number] Date of Birth/Sex: May 24, 1934 (82 [name] 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 Etiology: Wound Location: Abdomen - Lower Quadrant - Midline Wound Open Status: Wounding Event: Trauma Comorbid Arrhythmia, Congestive Heart Failure, Date Acquired: 08/12/2015 History: Hypertension, Type II Diabetes Weeks Of Treatment: 17 Clustered Wound: No Photos Photo Uploaded By: Alejandro Mulling on 04/15/2016 16:48:23 Wound Measurements Length: (cm) 0.1 Width: (cm) 0.1 Depth: (cm) 0.1 Area: (cm) 0.008 Volume: (cm) 0.001 % Reduction in Area: 100% % Reduction in  Volume: 100% Epithelialization: Medium (34-66%) Tunneling: No Undermining: No Wound Description Full Thickness Without Exposed Classification: Support Structures Wound Margin: Flat and Intact Exudate Small Amount: Exudate Type: Serosanguineous Exudate Color: red, brown Foul Odor After Cleansing: No Wound Bed Granulation Amount: Large (67-100%) Exposed Structure Granulation Quality: Red, Pink Fascia Exposed: No Necrotic Amount: Small (1-33%) Fat Layer Exposed: No Necrotic Quality: Eschar Tendon Exposed: No MARIELENA, HARVELL ([id number]) Muscle Exposed: No Joint Exposed: No Bone Exposed: No Limited to Skin Breakdown Periwound Skin Texture Texture Color No Abnormalities Noted: No No Abnormalities Noted: No Callus: No Atrophie Blanche: No Crepitus: No Cyanosis: No Excoriation: No Ecchymosis: No Fluctuance: No Erythema: Yes Friable: No Erythema Location: Circumferential Induration: No Hemosiderin Staining: No Localized Edema: No Mottled: No Rash: No Pallor: No Scarring:  No Rubor: No Moisture Temperature / Pain No Abnormalities Noted: No Temperature: No Abnormality Dry / Scaly: No Tenderness on Palpation: Yes Maceration: No Moist: Yes Wound Preparation Ulcer Cleansing: Rinsed/Irrigated with Saline Topical Anesthetic Applied: Other: lidocaine4%, Treatment Notes Wound #11 (Midline Abdomen - Lower Quadrant) 1. Cleansed with: Clean wound with Normal Saline 2. Anesthetic Topical Lidocaine 4% cream to wound bed prior to debridement 3. Peri-wound Care: Skin Prep 4. Dressing Applied: Aquacel Ag 5. Secondary Dressing Applied Bordered Foam Dressing Electronic Signature(s) Signed: 04/16/2016 4:54:36 PM By: Alejandro Mulling Entered By: Alejandro Mulling on 04/15/2016 09:48:10 Natalie Rima ([id number]) -------------------------------------------------------------------------------- Wound Assessment Details Patient Name: Natalie Rima Date of Service:  04/15/2016 8:00 AM Medical Record Number: [id number] Patient Account Number: [id number] Date of Birth/Sex: March 20, 1934 (82 [name] Female) Treating RN: Ashok Cordia, Debi Primary Care Physician: [name] Other Clinician: Referring Physician: [name] Treating Physician/Extender: Kathreen Cosier in Treatment: 17 Wound Status Wound Number: 14 Primary Open Surgical Wound Etiology: Wound Location: Right Amputation Site - Digit Wound Open Wounding Event: Surgical Injury Status: Date Acquired: 11/11/2015 Comorbid Arrhythmia, Congestive Heart Failure, Weeks Of Treatment: 15 History: Hypertension, Type II Diabetes Clustered Wound: No Photos Photo Uploaded By: Alejandro Mulling on 04/15/2016 16:48:23 Wound Measurements Length: (cm) 0.1 Width: (cm) 0.1 Depth: (cm) 0.1 Area: (cm) 0.008 Volume: (cm) 0.001 % Reduction in Area: 98.9% % Reduction in Volume: 98.7% Epithelialization: Medium (34-66%) Tunneling: No Undermining: No Wound Description Full Thickness Without Exposed Classification: Support Structures Wound Margin: Flat and Intact Exudate Small Amount: Exudate Type: Serosanguineous Exudate Color: red, brown Foul Odor After Cleansing: No Wound Bed Granulation Amount: Large (67-100%) Exposed Structure Granulation Quality: Pink Fascia Exposed: No Necrotic Amount: Small (1-33%) Fat Layer Exposed: No Necrotic Quality: Eschar Tendon Exposed: No Muscle Exposed: No ERNESHA, RAMONE ([id number]) Joint Exposed: No Bone Exposed: No Limited to Skin Breakdown Periwound Skin Texture Texture Color No Abnormalities Noted: No No Abnormalities Noted: No Callus: No Atrophie Blanche: No Crepitus: No Cyanosis: No Excoriation: No Ecchymosis: No Fluctuance: No Erythema: No Friable: No Hemosiderin Staining: No Induration: No Mottled: No Localized Edema: No Pallor: No Rash: No Rubor: No Scarring: No Temperature / Pain Moisture Temperature: No Abnormality No  Abnormalities Noted: No Dry / Scaly: Yes Maceration: No Moist: No Wound Preparation Ulcer Cleansing: Rinsed/Irrigated with Saline Topical Anesthetic Applied: None, Other: lidocaine 4%, Treatment Notes Wound #14 (Right Amputation Site - Digit) 1. Cleansed with: Clean wound with Normal Saline 2. Anesthetic Topical Lidocaine 4% cream to wound bed prior to debridement 4. Dressing Applied: Aquacel Ag 5. Secondary Dressing Applied Non-Adherent pad Notes band-aide Electronic Signature(s) Signed: 04/16/2016 4:54:36 PM By: Alejandro Mulling Entered By: Alejandro Mulling on 04/15/2016 09:49:04 Natalie Rima ([id number]) -------------------------------------------------------------------------------- Wound Assessment Details Patient Name: Natalie Rima Date of Service: 04/15/2016 8:00 AM Medical Record Patient Account Number: [id number] Number: Treating RN: Phillis Haggis Jul 02, 1933 (82 [name] Other Clinician: Date of Birth/Sex: Female) Treating ROBSON, MICHAEL Primary Care Physician: [name] Physician/Extender: G Referring Physician: Charolotte Capuchin in Treatment: 17 Wound Status Wound Number: 16 Primary Diabetic Wound/Ulcer of the Lower Etiology: Extremity Wound Location: Right Toe Fifth - Dorsal Wound Open Wounding Event: Not Known Status: Date Acquired: 04/07/2016 Comorbid Arrhythmia, Congestive Heart Failure, Weeks Of Treatment: 1 History: Hypertension, Type II Diabetes Clustered Wound: No Photos Photo Uploaded By: Alejandro Mulling on 04/15/2016 16:48:51 Wound Measurements Length: (cm) 0 % Reductio Width: (cm) 0 % Reductio Depth: (cm) 0 Epithelial Area: (cm) 0 Tunneling Volume: (cm) 0 Undermini n  in Area: 100% n in Volume: 100% ization: Large (67-100%) : No ng: No Wound Description Classification: Grade 1 Wound Margin: Distinct, outline attached Exudate Amount: None Present Foul Odor After Cleansing: No Wound Bed Granulation  Amount: None Present (0%) Exposed Structure Necrotic Amount: None Present (0%) Fascia Exposed: No Fat Layer Exposed: No Tendon Exposed: No Muscle Exposed: No Joint Exposed: No Bone Exposed: No LEISL, SPURRIER ([id number]) Limited to Skin Breakdown Periwound Skin Texture Texture Color No Abnormalities Noted: No No Abnormalities Noted: No Moisture Temperature / Pain No Abnormalities Noted: No Temperature: No Abnormality Dry / Scaly: Yes Tenderness on Palpation: Yes Moist: No Wound Preparation Ulcer Cleansing: Rinsed/Irrigated with Saline Topical Anesthetic Applied: None Electronic Signature(s) Signed: 04/16/2016 4:54:36 PM By: Alejandro Mulling Entered By: Alejandro Mulling on 04/15/2016 08:38:14 Natalie Rima ([id number]) -------------------------------------------------------------------------------- Wound Assessment Details Patient Name: Natalie Rima Date of Service: 04/15/2016 8:00 AM Medical Record Number: [id number] Patient Account Number: [id number] Date of Birth/Sex: 04-05-1934 (82 [name] 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 Etiology: Date Acquired: 06/02/2015 Wound Open Weeks Of Treatment: 17 Status: Clustered Wound: No Comorbid Arrhythmia, Congestive Heart Pending Amputation On Presentation History: Failure, Hypertension, Type II Diabetes Photos Photo Uploaded By: Alejandro Mulling on 04/15/2016 16:48:52 Wound Measurements Length: (cm) 10 Width: (cm) 2.1 Depth: (cm) 0.3 Area: (cm) 16.493 Volume: (cm) 4.948 % Reduction in Area: 49.4% % Reduction in Volume: 49.4% Epithelialization:  None Tunneling: Yes Position (o'clock): 12 Maximum Distance: (cm) 3.5 Wound Description Classification: Grade 2 Wound Margin: Thickened Exudate Amount: Large Exudate Type: Serosanguineous Exudate Color: red, brown Foul Odor After Cleansing: Yes Due to Product Use: No Wound Bed Granulation Amount: Medium (34-66%) Exposed Structure Granulation Quality: Red Fascia Exposed: No CORIANN, BROUHARD ([id number]) Necrotic Amount: Medium (34-66%) Fat Layer Exposed: No Necrotic Quality: Eschar, Adherent Slough Tendon Exposed: Yes Muscle Exposed: No Joint Exposed: No Bone Exposed: No Periwound Skin Texture Texture Color No Abnormalities Noted: No No Abnormalities Noted: No Callus: No Atrophie Blanche: No Crepitus: No Cyanosis: No Excoriation: No Ecchymosis: No Fluctuance: No Erythema: Yes Friable: No Erythema Location: Circumferential Induration: No Hemosiderin Staining: No Localized Edema: No Mottled: No Rash: No Pallor: No Scarring: No Rubor: No Moisture Temperature / Pain No Abnormalities Noted: No Tenderness on Palpation: Yes Dry / Scaly: No Maceration: Yes Moist: Yes Wound Preparation Ulcer Cleansing: Rinsed/Irrigated with Saline Topical Anesthetic Applied: Other: lidocaine 4%, Treatment Notes Wound #9 (Right Achilles) 1. Cleansed with: Clean wound with Normal Saline 2. Anesthetic Topical Lidocaine 4% cream to wound bed prior to debridement 4. Dressing Applied: Hydrafera Blue Iodoform packing Gauze 5. Secondary Dressing Applied ABD Pad Kerlix/Conform 7. Secured with Tape Notes netting Electronic Signature(s) ANGELYNN, LEMUS ([id number]) Signed: 04/16/2016 4:54:36 PM By: Alejandro Mulling Entered By: Alejandro Mulling on 04/15/2016 09:49:29 Natalie Rima ([id number]) -------------------------------------------------------------------------------- Vitals Details Patient Name: EYLEEN, RAWLINSON. Date of Service: 04/15/2016 8:00 AM Medical Record  Number: [id number] Patient Account Number: [id number] Date of Birth/Sex: 01-09-1934 (82 [name] Female) Treating RN: Phillis Haggis Primary Care Physician: [name] Other Clinician: Referring Physician: [name] Treating Physician/Extender: Kathreen Cosier in Treatment: 17 Vital Signs Time Taken: 08:19 Temperature (F): 97.2 Height (in): 65 Pulse (bpm): 66 Weight (lbs): 160 Respiratory Rate (breaths/min): 18 Body Mass Index (BMI):  26.6 Blood Pressure (mmHg): 132/58 Reference Range: 80 - 120 mg / dl Electronic Signature(s) Signed: 04/16/2016 4:54:36 PM By: Alejandro Mulling Entered By: Alejandro Mulling on 04/15/2016 09:47:37

## 2016-04-21 ENCOUNTER — Other Ambulatory Visit (INDEPENDENT_AMBULATORY_CARE_PROVIDER_SITE_OTHER): Payer: Self-pay | Admitting: Vascular Surgery

## 2016-04-22 ENCOUNTER — Encounter: Payer: No Typology Code available for payment source | Admitting: Nurse Practitioner

## 2016-04-22 DIAGNOSIS — E11621 Type 2 diabetes mellitus with foot ulcer: Secondary | ICD-10-CM | POA: Diagnosis not present

## 2016-04-27 ENCOUNTER — Ambulatory Visit
Admission: RE | Admit: 2016-04-27 | Discharge: 2016-04-27 | Disposition: A | Discharge status: Home / Self Care | Payer: Medicare Other | Source: Ambulatory Visit | Attending: Vascular Surgery | Admitting: Vascular Surgery

## 2016-04-27 ENCOUNTER — Encounter
Admission: RE | Disposition: A | Discharge status: Home / Self Care | Payer: Self-pay | Source: Ambulatory Visit | Attending: Vascular Surgery

## 2016-04-27 ENCOUNTER — Encounter: Payer: Self-pay | Admitting: *Deleted

## 2016-04-27 ENCOUNTER — Encounter (INDEPENDENT_AMBULATORY_CARE_PROVIDER_SITE_OTHER): Payer: Self-pay

## 2016-04-27 DIAGNOSIS — Z8673 Personal history of transient ischemic attack (TIA), and cerebral infarction without residual deficits: Secondary | ICD-10-CM | POA: Diagnosis not present

## 2016-04-27 DIAGNOSIS — I639 Cerebral infarction, unspecified: Secondary | ICD-10-CM | POA: Diagnosis not present

## 2016-04-27 DIAGNOSIS — Z9049 Acquired absence of other specified parts of digestive tract: Secondary | ICD-10-CM | POA: Diagnosis not present

## 2016-04-27 DIAGNOSIS — Z7902 Long term (current) use of antithrombotics/antiplatelets: Secondary | ICD-10-CM | POA: Diagnosis not present

## 2016-04-27 DIAGNOSIS — T82858A Stenosis of vascular prosthetic devices, implants and grafts, initial encounter: Secondary | ICD-10-CM | POA: Diagnosis present

## 2016-04-27 DIAGNOSIS — I6529 Occlusion and stenosis of unspecified carotid artery: Secondary | ICD-10-CM | POA: Diagnosis not present

## 2016-04-27 DIAGNOSIS — Z823 Family history of stroke: Secondary | ICD-10-CM | POA: Insufficient documentation

## 2016-04-27 DIAGNOSIS — Z992 Dependence on renal dialysis: Secondary | ICD-10-CM | POA: Insufficient documentation

## 2016-04-27 DIAGNOSIS — I509 Heart failure, unspecified: Secondary | ICD-10-CM | POA: Diagnosis not present

## 2016-04-27 DIAGNOSIS — I132 Hypertensive heart and chronic kidney disease with heart failure and with stage 5 chronic kidney disease, or end stage renal disease: Secondary | ICD-10-CM | POA: Diagnosis not present

## 2016-04-27 DIAGNOSIS — Z91041 Radiographic dye allergy status: Secondary | ICD-10-CM | POA: Insufficient documentation

## 2016-04-27 DIAGNOSIS — D649 Anemia, unspecified: Secondary | ICD-10-CM | POA: Diagnosis not present

## 2016-04-27 DIAGNOSIS — Z800 Family history of malignant neoplasm of digestive organs: Secondary | ICD-10-CM | POA: Insufficient documentation

## 2016-04-27 DIAGNOSIS — E1122 Type 2 diabetes mellitus with diabetic chronic kidney disease: Secondary | ICD-10-CM | POA: Diagnosis not present

## 2016-04-27 DIAGNOSIS — Z89421 Acquired absence of other right toe(s): Secondary | ICD-10-CM | POA: Insufficient documentation

## 2016-04-27 DIAGNOSIS — Z96643 Presence of artificial hip joint, bilateral: Secondary | ICD-10-CM | POA: Diagnosis not present

## 2016-04-27 DIAGNOSIS — L98499 Non-pressure chronic ulcer of skin of other sites with unspecified severity: Secondary | ICD-10-CM | POA: Diagnosis not present

## 2016-04-27 DIAGNOSIS — Z885 Allergy status to narcotic agent status: Secondary | ICD-10-CM | POA: Insufficient documentation

## 2016-04-27 DIAGNOSIS — Z9841 Cataract extraction status, right eye: Secondary | ICD-10-CM | POA: Insufficient documentation

## 2016-04-27 DIAGNOSIS — N186 End stage renal disease: Secondary | ICD-10-CM | POA: Diagnosis not present

## 2016-04-27 DIAGNOSIS — I7025 Atherosclerosis of native arteries of other extremities with ulceration: Secondary | ICD-10-CM | POA: Diagnosis not present

## 2016-04-27 DIAGNOSIS — Y832 Surgical operation with anastomosis, bypass or graft as the cause of abnormal reaction of the patient, or of later complication, without mention of misadventure at the time of the procedure: Secondary | ICD-10-CM | POA: Insufficient documentation

## 2016-04-27 DIAGNOSIS — K219 Gastro-esophageal reflux disease without esophagitis: Secondary | ICD-10-CM | POA: Diagnosis not present

## 2016-04-27 DIAGNOSIS — T82868A Thrombosis of vascular prosthetic devices, implants and grafts, initial encounter: Secondary | ICD-10-CM | POA: Diagnosis not present

## 2016-04-27 DIAGNOSIS — J90 Pleural effusion, not elsewhere classified: Secondary | ICD-10-CM | POA: Insufficient documentation

## 2016-04-27 DIAGNOSIS — G2581 Restless legs syndrome: Secondary | ICD-10-CM | POA: Insufficient documentation

## 2016-04-27 DIAGNOSIS — Z833 Family history of diabetes mellitus: Secondary | ICD-10-CM | POA: Insufficient documentation

## 2016-04-27 DIAGNOSIS — Z9842 Cataract extraction status, left eye: Secondary | ICD-10-CM | POA: Diagnosis not present

## 2016-04-27 DIAGNOSIS — I739 Peripheral vascular disease, unspecified: Secondary | ICD-10-CM | POA: Insufficient documentation

## 2016-04-27 DIAGNOSIS — M109 Gout, unspecified: Secondary | ICD-10-CM | POA: Insufficient documentation

## 2016-04-27 DIAGNOSIS — Z89422 Acquired absence of other left toe(s): Secondary | ICD-10-CM | POA: Insufficient documentation

## 2016-04-27 DIAGNOSIS — Z888 Allergy status to other drugs, medicaments and biological substances status: Secondary | ICD-10-CM | POA: Insufficient documentation

## 2016-04-27 HISTORY — PX: PERIPHERAL VASCULAR CATHETERIZATION: SHX172C

## 2016-04-27 LAB — GLUCOSE, CAPILLARY: Glucose-Capillary: 75 mg/dL (ref 65–99)

## 2016-04-27 LAB — PROTIME-INR
INR: 1.26
PROTHROMBIN TIME: 15.9 s — AB (ref 11.4–15.2)

## 2016-04-27 LAB — POTASSIUM (ARMC VASCULAR LAB ONLY): Potassium (ARMC vascular lab): 4.4 (ref 3.5–5.1)

## 2016-04-27 SURGERY — A/V SHUNTOGRAM/FISTULAGRAM
Anesthesia: Moderate Sedation | Site: Arm Upper | Laterality: Right

## 2016-04-27 MED ORDER — DEXTROSE 50 % IV SOLN
25.0000 g | Freq: Once | INTRAVENOUS | Status: AC
  Administered 2016-04-27: 25 g via INTRAVENOUS

## 2016-04-27 MED ORDER — SODIUM CHLORIDE 0.9 % IV SOLN
INTRAVENOUS | Status: DC
  Administered 2016-04-27: 10:00:00 via INTRAVENOUS

## 2016-04-27 MED ORDER — HYDROMORPHONE HCL 1 MG/ML IJ SOLN: 1.0000 mg | Freq: Once | INTRAMUSCULAR | Status: DC

## 2016-04-27 MED ORDER — OXYCODONE-ACETAMINOPHEN 5-325 MG PO TABS: 1.0000 | ORAL_TABLET | ORAL | Status: DC | PRN

## 2016-04-27 MED ORDER — HEPARIN SODIUM (PORCINE) 1000 UNIT/ML IJ SOLN
INTRAMUSCULAR | Status: DC | PRN
  Administered 2016-04-27: 3000 [IU] via INTRAVENOUS

## 2016-04-27 MED ORDER — GUAIFENESIN-DM 100-10 MG/5ML PO SYRP: 15.0000 mL | ORAL_SOLUTION | ORAL | Status: DC | PRN

## 2016-04-27 MED ORDER — MIDAZOLAM HCL 2 MG/2ML IJ SOLN
INTRAMUSCULAR | Status: DC | PRN
  Administered 2016-04-27: 2 mg via INTRAVENOUS
  Administered 2016-04-27: 1 mg via INTRAVENOUS

## 2016-04-27 MED ORDER — HEPARIN SODIUM (PORCINE) 1000 UNIT/ML IJ SOLN
INTRAMUSCULAR | Status: AC
  Filled 2016-04-27: qty 1

## 2016-04-27 MED ORDER — FENTANYL CITRATE (PF) 100 MCG/2ML IJ SOLN
INTRAMUSCULAR | Status: DC | PRN
  Administered 2016-04-27: 50 ug via INTRAVENOUS
  Administered 2016-04-27: 25 ug via INTRAVENOUS

## 2016-04-27 MED ORDER — LABETALOL HCL 5 MG/ML IV SOLN: 10.0000 mg | INTRAVENOUS | Status: DC | PRN

## 2016-04-27 MED ORDER — PHENOL 1.4 % MT LIQD: 1.0000 | OROMUCOSAL | Status: DC | PRN

## 2016-04-27 MED ORDER — MORPHINE SULFATE (PF) 4 MG/ML IV SOLN: 2.0000 mg | INTRAVENOUS | Status: DC | PRN

## 2016-04-27 MED ORDER — FAMOTIDINE 20 MG PO TABS
40.0000 mg | ORAL_TABLET | ORAL | Status: DC | PRN
  Administered 2016-04-27: 40 mg via ORAL

## 2016-04-27 MED ORDER — METHYLPREDNISOLONE SODIUM SUCC 125 MG IJ SOLR
INTRAMUSCULAR | Status: AC
  Filled 2016-04-27: qty 2

## 2016-04-27 MED ORDER — IOPAMIDOL (ISOVUE-300) INJECTION 61%
INTRAVENOUS | Status: DC | PRN
  Administered 2016-04-27: 35 mL via INTRA_ARTERIAL

## 2016-04-27 MED ORDER — FAMOTIDINE 20 MG PO TABS
ORAL_TABLET | ORAL | Status: AC
  Administered 2016-04-27: 40 mg via ORAL
  Filled 2016-04-27: qty 2

## 2016-04-27 MED ORDER — ONDANSETRON HCL 4 MG/2ML IJ SOLN: 4.0000 mg | Freq: Four times a day (QID) | INTRAMUSCULAR | Status: DC | PRN

## 2016-04-27 MED ORDER — DEXTROSE 5 % IV SOLN
1.5000 g | INTRAVENOUS | Status: AC
  Administered 2016-04-27: 1.5 g via INTRAVENOUS

## 2016-04-27 MED ORDER — MIDAZOLAM HCL 5 MG/5ML IJ SOLN
INTRAMUSCULAR | Status: AC
  Filled 2016-04-27: qty 5

## 2016-04-27 MED ORDER — METHYLPREDNISOLONE SODIUM SUCC 125 MG IJ SOLR
125.0000 mg | INTRAMUSCULAR | Status: DC | PRN
  Administered 2016-04-27: 125 mg via INTRAVENOUS

## 2016-04-27 MED ORDER — HYDRALAZINE HCL 20 MG/ML IJ SOLN: 5.0000 mg | INTRAMUSCULAR | Status: DC | PRN

## 2016-04-27 MED ORDER — METOPROLOL TARTRATE 5 MG/5ML IV SOLN: 2.0000 mg | INTRAVENOUS | Status: DC | PRN

## 2016-04-27 MED ORDER — ACETAMINOPHEN 325 MG RE SUPP
325.0000 mg | RECTAL | Status: DC | PRN
  Filled 2016-04-27: qty 2

## 2016-04-27 MED ORDER — ALUM & MAG HYDROXIDE-SIMETH 200-200-20 MG/5ML PO SUSP: 15.0000 mL | ORAL | Status: DC | PRN

## 2016-04-27 MED ORDER — DEXTROSE 50 % IV SOLN
INTRAVENOUS | Status: AC
  Administered 2016-04-27: 25 g via INTRAVENOUS
  Filled 2016-04-27: qty 50

## 2016-04-27 MED ORDER — SODIUM CHLORIDE 0.9 % IV SOLN: 500.0000 mL | Freq: Once | INTRAVENOUS | Status: DC | PRN

## 2016-04-27 MED ORDER — FENTANYL CITRATE (PF) 100 MCG/2ML IJ SOLN
INTRAMUSCULAR | Status: AC
  Filled 2016-04-27: qty 2

## 2016-04-27 MED ORDER — LIDOCAINE HCL (PF) 1 % IJ SOLN
INTRAMUSCULAR | Status: AC
  Filled 2016-04-27: qty 30

## 2016-04-27 MED ORDER — ACETAMINOPHEN 325 MG PO TABS: 325.0000 mg | ORAL_TABLET | ORAL | Status: DC | PRN

## 2016-04-27 SURGICAL SUPPLY — 15 items
BALLN DORADO 7X80X80 (BALLOONS) ×3
BALLN DORADO 8X60X80 (BALLOONS) ×3
BALLN LUTONIX AV 7X60X75 (BALLOONS) ×3
BALLOON DORADO 7X80X80 (BALLOONS) ×1 IMPLANT
BALLOON DORADO 8X60X80 (BALLOONS) ×1 IMPLANT
BALLOON LUTONIX AV 7X60X75 (BALLOONS) ×1 IMPLANT
CANNULA 5F STIFF (CANNULA) ×3 IMPLANT
CATH TORCON 5FR 0.38 (CATHETERS) ×3 IMPLANT
DEVICE PRESTO INFLATION (MISCELLANEOUS) ×3 IMPLANT
DRAPE BRACHIAL (DRAPES) ×3 IMPLANT
GLIDEWIRE ADV .035X180CM (WIRE) ×3 IMPLANT
PACK ANGIOGRAPHY (CUSTOM PROCEDURE TRAY) ×3 IMPLANT
SHEATH BRITE TIP 6FRX5.5 (SHEATH) ×3 IMPLANT
TOWEL OR 17X26 4PK STRL BLUE (TOWEL DISPOSABLE) ×3 IMPLANT
WIRE MAGIC TOR.035 180C (WIRE) ×3 IMPLANT

## 2016-04-27 NOTE — H&P (Signed)
[facility] History & Physical Update  The patient was interviewed and re-examined.  The patient's previous History and Physical has been reviewed and is unchanged.  There is no change in the plan of care. We plan to proceed with the scheduled procedure.  Festus BarrenJason Dew, MD  04/27/2016, 8:54 AM

## 2016-04-27 NOTE — Op Note (Signed)
[facility]    OPERATIVE NOTE   PROCEDURE: 1.   Right brachiocephalic arteriovenous fistula cannulation under ultrasound guidance 2.   Right arm fistulagram including central venogram 3.   Percutaneous transluminal angioplasty of the right cephalic vein subclavian vein junction and subclavian vein with 7 mm diameter Lutonix drug-coated balloon and 7 and 8 mm diameter high pressure angioplasty balloon  PRE-OPERATIVE DIAGNOSIS: 1. ESRD 2. 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 The arterial access site aneurysm was in near proximity to a stenosis and near the arterial anastomosis would not likely amenable to covered stenting nor was the venous access aneurysm.  SPECIMEN(S):  None  CONTRAST: 35 cc  FLUORO TIME: 4.7 minutes  MODERATE CONSCIOUS SEDATION TIME: Approximately 20 minutes with 3 mg of Versed and 75 mcg of Fentanyl   INDICATIONS: [name] is a 82 y.o. female who presents with malfunctioning aneurysmal right brachiocephalic arteriovenous fistula.  The patient is scheduled for right arm fistulagram to help potentially treat the fistula as well as to assess it for potential surgical revision given the large aneurysms that may not be amenable to endovascular therapy.  The patient is aware the risks include but are not limited to: bleeding, infection, thrombosis of the cannulated access, and possible anaphylactic reaction to the contrast.  The patient is aware of the risks of the procedure and elects to proceed forward.  DESCRIPTION: After full informed written consent was obtained, the patient was brought  back to the angiography suite and placed supine upon the angiography table.  The patient was connected to monitoring equipment. Moderate conscious sedation was administered with a face to face encounter with the patient throughout the procedure with my supervision of the RN administering medicines and monitoring the patient's vital signs and mental status throughout from the start of the procedure until the patient was taken to the recovery room. The right arm was prepped and draped in the standard fashion for a percutaneous access intervention.  Under ultrasound guidance, the right brachiocephalic arteriovenous fistula was cannulated with a micropuncture needle under direct ultrasound guidance and a permanent image was performed.  The microwire was advanced into the fistula and the needle was exchanged for the a microsheath.  I then upsized to a 6 Fr Sheath and imaging was performed.  Hand injections were completed to image the access including the central venous system. This demonstrated nearly occlusive stenosis at the cephalic vein subclavian vein confluence and a second stenosis in close proximity in the subclavian vein after dilatation shows 2-3 cm beyond the junction. The arterial access site aneurysm was in near proximity to a stenosis and near the arterial anastomosis would not likely amenable to covered stenting nor was the venous access aneurysm.  Based on the images, this patient will need intervention to the central vein stenosis and surgical revision for the aneurysmal segments. If we did not treat the central vein stenosis, the surgical revision would likely be poorly functioning as well. I then gave the patient 3000 units of intravenous heparin.  I then crossed the stenosis with a Kumpe catheter and an advantage wire.  Based on the imaging, a 7 mm x 6  cm  Lutonix drug-coated angioplasty balloon was selected.  The balloon was centered around the cephalic vein subclavian vein stenosis but also was  able to treat the nearby subclavian vein stenosis with the same balloon and inflated to 14 ATM for 1 minute(s).  The waist never resolved, and so we upsized to a high pressure angioplasty balloon first using a 7 mm diameter high pressure balloon inflated to 28 atm and then an 8 mm diameter high pressure balloon inflated to 20 atm for 1 minute. On completion imaging, a 25-30 % residual stenosis was present both at the confluence and slightly more centrally in the subclavian vein.     Based on the completion imaging, no further intervention is necessary.  The wire and balloon were removed from the sheath.  A 4-0 Monocryl purse-string suture was sewn around the sheath.  The sheath was removed while tying down the suture.  A sterile bandage was applied to the puncture site.  COMPLICATIONS: None  CONDITION: Stable   Leotis Pain  04/27/2016 10:48 AM   This note was created with Dragon Medical transcription system. Any errors in dictation are purely unintentional.

## 2016-04-27 NOTE — Discharge Instructions (Signed)
Fistulogram, Care After °Introduction °Refer to this sheet in the next few weeks. These instructions provide you with information on caring for yourself after your procedure. Your health care provider may also give you more specific instructions. Your treatment has been planned according to current medical practices, but problems sometimes occur. Call your health care provider if you have any problems or questions after your procedure. °What can I expect after the procedure? °After your procedure, it is typical to have the following: °· A small amount of discomfort in the area where the catheters were placed. °· A small amount of bruising around the fistula. °· Sleepiness and fatigue. °Follow these instructions at home: °· Rest at home for the day following your procedure. °· Do not drive or operate heavy machinery while taking pain medicine. °· Take medicines only as directed by your health care provider. °· Do not take baths, swim, or use a hot tub until your health care provider approves. You may shower 24 hours after the procedure or as directed by your health care provider. °· There are many different ways to close and cover an incision, including stitches, skin glue, and adhesive strips. Follow your health care provider's instructions on: °¨ Incision care. °¨ Bandage (dressing) changes and removal. °¨ Incision closure removal. °· Monitor your dialysis fistula carefully. °Contact a health care provider if: °· You have drainage, redness, swelling, or pain at your catheter site. °· You have a fever. °· You have chills. °Get help right away if: °· You feel weak. °· You have trouble balancing. °· You have trouble moving your arms or legs. °· You have problems with your speech or vision. °· You can no longer feel a vibration or buzz when you put your fingers over your dialysis fistula. °· The limb that was used for the procedure: °¨ Swells. °¨ Is painful. °¨ Is cold. °¨ Is discolored, such as blue or pale  white. °This information is not intended to replace advice given to you by your health care provider. Make sure you discuss any questions you have with your health care provider. °Document Released: 10/02/2013 Document Revised: 10/24/2015 Document Reviewed: 07/07/2013 °© 2017 [employer] ° °

## 2016-04-28 ENCOUNTER — Encounter: Payer: Self-pay | Admitting: Vascular Surgery

## 2016-04-28 ENCOUNTER — Other Ambulatory Visit (INDEPENDENT_AMBULATORY_CARE_PROVIDER_SITE_OTHER): Payer: Self-pay | Admitting: Vascular Surgery

## 2016-04-28 NOTE — Progress Notes (Signed)
Natalie, [name] ([id number]) Visit Report for 04/22/2016 Arrival Information Details Patient Name: Natalie, [name]. Date of Service: 04/22/2016 8:00 AM Medical Record Number: [id number] Patient Account Number: [id number] Date of Birth/Sex: Jul 07, 1933 ([name]) Treating RN: Ashok Cordia, Debi Primary Care Physician: [name] Other Clinician: Referring Physician: [name] Treating Physician/Extender: Kathreen Cosier in Treatment: 18 Visit Information History Since Last Visit All ordered tests and consults were completed: No Patient Arrived: Wheel Chair Added or deleted any medications: No Arrival Time: 08:10 Any new allergies or adverse reactions: No Accompanied By: daughter Had a fall or experienced change in No Transfer Assistance: EasyPivot Patient activities of daily living that may affect Lift risk of falls: Patient Identification Verified: Yes Signs or symptoms of abuse/neglect since last No Secondary Verification Process Yes visito Completed: Hospitalized since last visit: No Patient Requires Transmission- No Pain Present Now: No Based Precautions: Patient Has Alerts: Yes Patient Alerts: Patient on Blood Thinner DMII Warfarin ABI [place] Bilateral NO BP RIGHT ARM Electronic Signature(s) Signed: 04/27/2016 4:53:13 PM By: Alejandro Mulling Entered By: Alejandro Mulling on 04/22/2016 08:10:49 [name] ([id number]) -------------------------------------------------------------------------------- Encounter Discharge Information Details Patient Name: [name]. Date of Service: 04/22/2016 8:00 AM Medical Record Number: [id number] Patient Account Number: [id number] Date of Birth/Sex: [birth year] ([name]) Treating RN: Phillis Haggis Primary Care Physician: [name] Other Clinician: Referring Physician: [name] Treating Physician/Extender: Kathreen Cosier in Treatment: 7 Encounter Discharge Information  Items Discharge Pain Level: 0 Discharge Condition: Stable Ambulatory Status: Wheelchair Discharge Destination: Nursing Home Transportation: Other Accompanied By: daughter Schedule Follow-up Appointment: Yes Medication Reconciliation completed and provided to Patient/Care Yes May Manrique: Provided on Clinical Summary of Care: 04/22/2016 Form Type Recipient Paper Patient [facility] Electronic Signature(s) Signed: 04/22/2016 9:15:01 AM By: Gwenlyn Perking Entered By: Gwenlyn Perking on 04/22/2016 09:15:01 [name] ([id number]) -------------------------------------------------------------------------------- Lower Extremity Assessment Details Patient Name: [name] Date of Service: 04/22/2016 8:00 AM Medical Record Number: [id number] Patient Account Number: [id number] Date of Birth/Sex: [birth year] ([name]) Treating RN: Phillis Haggis Primary Care Physician: [name] Other Clinician: Referring Physician: [name] Treating Physician/Extender: Kathreen Cosier in Treatment: 18 Vascular Assessment Pulses: Posterior Tibial Dorsalis Pedis Palpable: [Left:Yes] [Right:No] Doppler: [Right:Monophasic] Extremity colors, hair growth, and conditions: Extremity Color: [Left:Hyperpigmented] [Right:Hyperpigmented] Temperature of Extremity: [Left:Warm] [Right:Warm] Capillary Refill: [Left:> 3 seconds] [Right:> 3 seconds] Toe Nail Assessment Left: Right: Thick: No No Discolored: No No Deformed: No No Improper Length and Hygiene: No No Electronic Signature(s) Signed: 04/27/2016 4:53:13 PM By: Alejandro Mulling Entered By: Alejandro Mulling on 04/22/2016 08:14:08 [name] ([id number]) -------------------------------------------------------------------------------- Multi Wound Chart Details Patient Name: [name] Date of Service: 04/22/2016 8:00 AM Medical Record Number: [id number] Patient Account Number: [id number] Date of Birth/Sex: [birth year] (82  y.o. 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] [14:Open Surgical Wound] [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: [11:N/A] [14:N/A] [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] Comorbid History: [11:Arrhythmia, Congestive Heart Failure, Hypertension, Type II Diabetes] [14:Arrhythmia, Congestive Heart Failure, Hypertension, Type II Diabetes] [17:Arrhythmia, Congestive Heart Failure, Hypertension, Type II Diabetes] Date Acquired: [  11:08/12/2015] [14:11/11/2015] [17:06/03/2015] Weeks of Treatment: [11:18] [14:16] [17:0] Wound Status: [11:Open] [14:Open] [17:Open] Pending Amputation on No [14:No] [17:No] Presentation: Measurements L x W x D 0.1x0.1x0.1 [14:0.1x0.1x0.1] [17:0.5x0.5x0.5] (cm) Area (cm) : [11:0.008] [14:0.008] [17:0.196] Volume (cm) : [11:0.001] [14:0.001] [17:0.098] % Reduction in Area: [11:100.00%] [14:98.90%] [17:N/A] % Reduction in Volume: 100.00% [14:98.70%] [17:N/A] Position 1 (o'clock): [17:12] Maximum Distance 1 [17:2] (cm): Tunneling: [11:No] [14:No] [17:Yes] Classification: [11:Full Thickness Without Exposed Support Structures] [14:Full Thickness Without Exposed Support Structures] [17:Grade 1] Exudate Amount: [11:Small] [14:Small] [17:Large] Exudate Type: Serosanguineous Serosanguineous Serosanguineous Exudate Color: red, brown red, brown  red, brown Foul Odor After No No No Cleansing: Odor Anticipated Due to N/A N/A N/A Product Use: Wound Margin: Flat and Intact Flat and Intact Distinct, outline attached Granulation Amount: Large (67-100%) Large (67-100%) None Present (0%) Granulation Quality: Red, Pink Pink N/A Necrotic Amount: Small (1-33%) Small (1-33%) Large (67-100%) Necrotic Tissue: Eschar Eschar Adherent Slough Exposed Structures: Fascia: No Fascia: No Fascia: No Fat: No Fat: No Fat: No Tendon: No Tendon: No Tendon: No Muscle: No Muscle: No Muscle: No Joint: No Joint: No Joint: No Bone: No Bone: No Bone: No Limited to Skin Limited to Skin Limited to Skin Breakdown Breakdown Breakdown Epithelialization: Medium (34-66%) Medium (34-66%) None Periwound Skin Texture: Edema: No Edema: No No Abnormalities Noted Excoriation: No Excoriation: No Induration: No Induration: No Callus: No Callus: No Crepitus: No Crepitus: No Fluctuance: No Fluctuance: No Friable: No Friable: No Rash: No Rash: No Scarring: No Scarring: No Periwound Skin Moist: Yes Dry/Scaly: Yes No Abnormalities Noted Moisture: Maceration: No Maceration: No Dry/Scaly: No Moist: No Periwound Skin Color: Erythema: Yes Atrophie Blanche: No No Abnormalities Noted Atrophie Blanche: No Cyanosis: No Cyanosis: No Ecchymosis: No Ecchymosis: No Erythema: No Hemosiderin Staining: No Hemosiderin Staining: No Mottled: No Mottled: No Pallor: No Pallor: No Rubor: No Rubor: No Erythema Location: Circumferential N/A N/A Temperature: No Abnormality No Abnormality No Abnormality Tenderness on Yes No Yes Palpation: Wound Preparation: Ulcer Cleansing: Ulcer Cleansing: Ulcer Cleansing: Rinsed/Irrigated with Rinsed/Irrigated with Rinsed/Irrigated with Saline Saline Saline Topical Anesthetic Topical Anesthetic Topical Anesthetic Natalie, [name] ([id number]) Applied: Other: Applied: None, Other: Applied: Other: lidocaine lidocaine4%  lidocaine 4% 4% Wound Number: 9 N/A N/A Photos: No Photos N/A N/A Wound Location: Right Achilles N/A N/A Wounding Event: Gradually Appeared N/A N/A 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 N/A N/A Comorbid History: Arrhythmia, Congestive N/A N/A Heart Failure, Hypertension, Type II Diabetes Date Acquired: 06/02/2015 N/A N/A Weeks of Treatment: 18 N/A N/A Wound Status: Open N/A N/A Pending Amputation on Yes N/A N/A Presentation: Measurements L x W x D 6.1x2.1x0.3 N/A N/A (cm) Area (cm) : 10.061 N/A N/A Volume (cm) : 3.018 N/A N/A % Reduction in Area: 69.10% N/A N/A % Reduction in Volume: 69.10% N/A N/A Tunneling: No N/A N/A Classification: Grade 2 N/A N/A Exudate Amount: Large N/A N/A Exudate Type: Serosanguineous N/A N/A Exudate Color: red, brown N/A N/A Foul Odor After Yes N/A N/A Cleansing: Odor Anticipated Due to No N/A N/A Product Use: Wound Margin: Thickened N/A N/A Granulation Amount: Medium (34-66%) N/A N/A Granulation Quality: Red N/A N/A Necrotic Amount: Medium (34-66%) N/A N/A Necrotic Tissue: Eschar, Adherent Slough N/A N/A Exposed Structures: Tendon: Yes N/A N/A Fascia: No Fat: No Muscle: No Joint: No Bone: No Epithelialization: None N/A N/A Periwound Skin Texture: Edema: No N/A N/A Excoriation: No Induration: No Natalie, [name] ([id number]) Callus: No Crepitus: No Fluctuance: No Friable: No Rash:  No Scarring: No Periwound Skin Maceration: Yes N/A N/A Moisture: Moist: Yes Dry/Scaly: No Periwound Skin Color: Erythema: Yes N/A N/A Atrophie Blanche: No Cyanosis: No Ecchymosis: No Hemosiderin Staining: No Mottled: No Pallor: No Rubor: No Erythema Location: Circumferential N/A N/A Temperature: N/A N/A N/A Tenderness on Yes N/A N/A Palpation: Wound Preparation: Ulcer Cleansing: N/A N/A Rinsed/Irrigated with Saline Topical Anesthetic Applied: Other:  lidocaine 4% Treatment Notes Electronic Signature(s) Signed: 04/27/2016 4:53:13 PM By: Alejandro MullingPinkerton, Debra Entered By: Alejandro MullingPinkerton, Debra on 04/22/2016 08:35:31 Natalie RimaHOPKINS, Natalie Y. ([id number]) -------------------------------------------------------------------------------- Multi-Disciplinary Care Plan Details Patient Name: Natalie RimaHOPKINS, Natalie Y. Date of Service: 04/22/2016 8:00 AM Medical Record Number: [id number] Patient Account Number: [id number] Date of Birth/Sex: [birth year] ([name]) Treating RN: Ashok CordiaPinkerton, Debi Primary Care Physician: Rolm GalaGrandis, Heidi Other Clinician: Referring Physician: Rolm GalaGrandis, Heidi Treating Physician/Extender: Kathreen Cosieroulter, Leah Weeks in Treatment: 8118 Active Inactive Abuse / Safety / Falls / Self Care Management Nursing Diagnoses: Impaired physical mobility Potential for falls Goals: Patient will remain injury free Date Initiated: 12/17/2015 Goal Status: Active Interventions: Assess fall risk on admission and as needed Notes: Necrotic Tissue Nursing Diagnoses: Impaired tissue integrity related to necrotic/devitalized tissue Knowledge deficit related to management of necrotic/devitalized tissue Goals: Necrotic/devitalized tissue will be minimized in the wound bed Date Initiated: 02/04/2016 Goal Status: Active Interventions: Assess patient pain level pre-, during and post procedure and prior to discharge Provide education on necrotic tissue and debridement process Treatment Activities: Apply topical anesthetic as ordered : 02/04/2016 Notes: Wound/Skin Impairment Natalie RimaHOPKINS, Natalie Y. ([id number]) Nursing Diagnoses: Impaired tissue integrity Goals: Patient/caregiver will verbalize understanding of skin care regimen Date Initiated: 12/17/2015 Goal Status: Active Ulcer/skin breakdown will have a volume reduction of 30% by week 4 Date Initiated: 12/17/2015 Goal Status: Active Ulcer/skin breakdown will have a volume reduction of 50% by week 8 Date Initiated:  12/17/2015 Goal Status: Active Ulcer/skin breakdown will have a volume reduction of 80% by week 12 Date Initiated: 12/17/2015 Goal Status: Active Ulcer/skin breakdown will heal within 14 weeks Date Initiated: 12/17/2015 Goal Status: Active Interventions: Assess patient/caregiver ability to obtain necessary supplies Assess patient/caregiver ability to perform ulcer/skin care regimen upon admission and as needed Assess ulceration(s) every visit Notes: Electronic Signature(s) Signed: 04/27/2016 4:53:13 PM By: Alejandro MullingPinkerton, Debra Entered By: Alejandro MullingPinkerton, Debra on 04/22/2016 08:35:18 Natalie RimaHOPKINS, Natalie Y. ([id number]) -------------------------------------------------------------------------------- Pain Assessment Details Patient Name: Natalie RimaHOPKINS, Vernon Y. Date of Service: 04/22/2016 8:00 AM Medical Record Number: [id number] Patient Account Number: [id number] Date of Birth/Sex: [birth year] ([name]) Treating RN: Phillis HaggisPinkerton, Debi Primary Care Physician: Rolm GalaGrandis, Heidi Other Clinician: Referring Physician: Rolm GalaGrandis, Heidi Treating Physician/Extender: Kathreen Cosieroulter, Leah Weeks in Treatment: 18 Active Problems Location of Pain Severity and Description of Pain Patient Has Paino No Site Locations With Dressing Change: No Pain Management and Medication Current Pain Management: Electronic Signature(s) Signed: 04/27/2016 4:53:13 PM By: Alejandro MullingPinkerton, Debra Entered By: Alejandro MullingPinkerton, Debra on 04/22/2016 08:10:56 Natalie RimaHOPKINS, Natalie Y. ([id number]) -------------------------------------------------------------------------------- Patient/Caregiver Education Details Patient Name: Natalie RimaHOPKINS, Natalie Y. Date of Service: 04/22/2016 8:00 AM Medical Record Number: [id number] Patient Account Number: [id number] Date of Birth/Gender: [birth year] ([name]) Treating RN: Phillis HaggisPinkerton, Debi Primary Care Physician: Rolm GalaGrandis, Heidi Other Clinician: Referring Physician: Rolm GalaGrandis, Heidi Treating Physician/Extender: Kathreen Cosieroulter, Leah Weeks in  Treatment: 6418 Education Assessment Education Provided To: Patient Education Topics Provided Wound/Skin Impairment: Handouts: Other: change dressing as ordered Methods: Demonstration, Explain/Verbal Responses: State content correctly Electronic Signature(s) Signed: 04/27/2016 4:53:13 PM By: Alejandro MullingPinkerton, Debra Entered By: Alejandro MullingPinkerton, Debra on 04/22/2016 08:56:01 Natalie RimaHOPKINS, [name] Y. ([id number]) -------------------------------------------------------------------------------- Wound Assessment Details Patient Name: Natalie RimaHOPKINS, Avangeline Y. Date  of Service: 04/22/2016 8:00 AM Medical Record Number: [id number] Patient Account Number: [id number] Date of Birth/Sex: 25-Sep-1933 (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 Etiology: Wound Location: Abdomen - Lower Quadrant - Midline Wound Open Status: Wounding Event: Trauma Comorbid Arrhythmia, Congestive Heart Failure, Date Acquired: 08/12/2015 History: Hypertension, Type II Diabetes Weeks Of Treatment: 18 Clustered Wound: No Photos Wound Measurements Length: (cm) 0 % Reduction Width: (cm) 0 % Reduction Depth: (cm) 0 Epithelializ Area: (cm) 0 Tunneling: Volume: (cm) 0 Undermining in Area: 100% in Volume: 100% ation: Large (67-100%) No : No Wound Description Full Thickness Without Exposed Classification: Support Structures Wound Margin: Flat and Intact Exudate None Present Amount: Foul Odor After Cleansing: No Wound Bed Granulation Amount: None Present (0%) Exposed Structure Necrotic Amount: None Present (0%) Fascia Exposed: No Fat Layer Exposed: No Tendon Exposed: No Muscle Exposed: No Joint Exposed: No Bone Exposed: No TAFFY, DELCONTE ([id number]) Limited to Skin Breakdown Periwound Skin Texture Texture  Color No Abnormalities Noted: No No Abnormalities Noted: No Callus: No Atrophie Blanche: No Crepitus: No Cyanosis: No Excoriation: No Ecchymosis: No Fluctuance: No Erythema: Yes Friable: No Hemosiderin Staining: No Induration: No Mottled: No Localized Edema: No Pallor: No Rash: No Rubor: No Scarring: No Temperature / Pain Moisture Temperature: No Abnormality No Abnormalities Noted: No Dry / Scaly: No Maceration: No Moist: No Wound Preparation Ulcer Cleansing: Rinsed/Irrigated with Saline Topical Anesthetic Applied: None Electronic Signature(s) Signed: 04/27/2016 4:53:13 PM By: Alejandro Mulling Entered By: Alejandro Mulling on 04/22/2016 08:42:02 [name] ([id number]) -------------------------------------------------------------------------------- Wound Assessment Details Patient Name: [name] Date of Service: 04/22/2016 8:00 AM Medical Record Number: [id number] Patient Account Number: [id number] Date of Birth/Sex: 26-Sep-1933 ([name]) Treating RN: Ashok Cordia, Debi Primary Care Physician: [name] Other Clinician: Referring Physician: [name] Treating Physician/Extender: Kathreen Cosier in Treatment: 18 Wound Status Wound Number: 14 Primary Open Surgical Wound Etiology: Wound Location: Right Amputation Site - Digit Wound Open Wounding Event: Surgical Injury Status: Date Acquired: 11/11/2015 Comorbid Arrhythmia, Congestive Heart Failure, Weeks Of Treatment: 16 History: Hypertension, Type II Diabetes Clustered Wound: No Photos Photo Uploaded By: Alejandro Mulling on 04/22/2016 08:37:13 Wound Measurements Length: (cm) 0.1 Width: (cm) 0.1 Depth: (cm) 0.1 Area: (cm) 0.008 Volume: (cm) 0.001 % Reduction in Area: 98.9% % Reduction in Volume: 98.7% Epithelialization: Medium (34-66%) Tunneling: No Undermining: No Wound Description Full Thickness Without Exposed Classification: Support Structures Wound Margin: Flat  and Intact Exudate Small Amount: Exudate Type: Serosanguineous Exudate Color: red, brown Foul Odor After Cleansing: No Wound Bed Granulation Amount: Large (67-100%) Exposed Structure Granulation Quality: Pink Fascia Exposed: No Necrotic Amount: Small (1-33%) Fat Layer Exposed: No Necrotic Quality: Eschar Tendon Exposed: No Muscle Exposed: No MELESSIA, KAUS ([id number]) Joint Exposed: No Bone Exposed: No Limited to Skin Breakdown Periwound Skin Texture Texture Color No Abnormalities Noted: No No Abnormalities Noted: No Callus: No Atrophie Blanche: No Crepitus: No Cyanosis: No Excoriation: No Ecchymosis: No Fluctuance: No Erythema: No Friable: No Hemosiderin Staining: No Induration: No Mottled: No Localized Edema: No Pallor: No Rash: No Rubor: No Scarring: No Temperature / Pain Moisture Temperature: No Abnormality No Abnormalities Noted: No Dry / Scaly: Yes Maceration: No Moist: No Wound Preparation Ulcer Cleansing: Rinsed/Irrigated with Saline Topical Anesthetic Applied: None, Other: lidocaine 4%, Treatment Notes Wound #14 (Right Amputation Site - Digit) 1. Cleansed with: Clean wound with  Normal Saline 4. Dressing Applied: Aquacel Ag Notes band-aide Electronic Signature(s) Signed: 04/27/2016 4:53:13 PM By: Alejandro Mulling Entered By: Alejandro Mulling on 04/22/2016 08:34:48 [name] ([id number]) -------------------------------------------------------------------------------- Wound Assessment Details Patient Name: [name] Date of Service: 04/22/2016 8:00 AM Medical Record Number: [id number] Patient Account Number: [id number] Date of Birth/Sex: December 14, 1933 ([name]) Treating RN: Ashok Cordia, Debi Primary Care Physician: [name] Other Clinician: Referring Physician: [name] Treating Physician/Extender: Bonnell Public Weeks in Treatment: 18 Wound Status Wound Number: 17 Primary Diabetic Wound/Ulcer of the  Lower Etiology: Extremity Wound Location: Right Achilles - Proximal Secondary Arterial Insufficiency Ulcer Wounding Event: Gradually Appeared Etiology: Date Acquired: 06/03/2015 Wound Open Weeks Of Treatment: 0 Status: Clustered Wound: No Comorbid Arrhythmia, Congestive Heart History: Failure, Hypertension, Type II Diabetes Photos Photo Uploaded By: Alejandro Mulling on 04/22/2016 08:37:32 Wound Measurements Length: (cm) 0.5 Width: (cm) 0.5 Depth: (cm) 0.5 Area: (cm) 0.196 Volume: (cm) 0.098 % Reduction in Area: % Reduction in Volume: Epithelialization: None Tunneling: Yes Position (o'clock): 12 Maximum Distance: (cm) 2 Wound Description Classification: Grade 1 Wound Margin: Distinct, outline attached Exudate Amount: Large Exudate Type: Serosanguineous Exudate Color: red, brown Foul Odor After Cleansing: No Wound Bed Granulation Amount: None Present (0%) Exposed Structure Necrotic Amount: Large (67-100%) Fascia Exposed: No DALILAH, CURLIN ([id number]) Necrotic Quality: Adherent Slough Fat Layer Exposed: No Tendon Exposed: No Muscle Exposed: No Joint Exposed: No Bone Exposed: No Limited to Skin Breakdown Periwound Skin Texture Texture Color No Abnormalities Noted: No No Abnormalities Noted: No Moisture Temperature / Pain No Abnormalities Noted: No Temperature: No Abnormality Tenderness on Palpation: Yes Wound Preparation Ulcer Cleansing: Rinsed/Irrigated with Saline Topical Anesthetic Applied: Other: lidocaine 4%, Treatment Notes Wound #17 (Right, Proximal Achilles) 1. Cleansed with: Clean wound with Normal Saline 2. Anesthetic Topical Lidocaine 4% cream to wound bed prior to debridement 4. Dressing Applied: Iodoform packing Gauze 5. Secondary Dressing Applied Dry Gauze Foam Kerlix/Conform 7. Secured with Tape Notes netting Electronic Signature(s) Signed: 04/27/2016 4:53:13 PM By: Alejandro Mulling Entered By: Alejandro Mulling on 04/22/2016  08:27:34 [name] ([id number]) -------------------------------------------------------------------------------- Wound Assessment Details Patient Name: [name] Date of Service: 04/22/2016 8:00 AM Medical Record Number: [id number] Patient Account Number: [id number] Date of Birth/Sex: [birth year] ([name]) Treating RN: Ashok Cordia, Debi Primary Care Physician: [name] Other Clinician: Referring Physician: [name] Treating Physician/Extender: Kathreen Cosier in Treatment: 18 Wound Status Wound Number: 9 Primary Diabetic Wound/Ulcer of the Lower Etiology: Extremity Wound Location: Right, Distal Achilles Secondary Arterial Insufficiency Ulcer Wounding Event: Gradually Appeared Etiology: Date Acquired: 06/02/2015 Wound Open Weeks Of Treatment: 18 Status: Clustered Wound: No Comorbid Arrhythmia, Congestive Heart Pending Amputation On Presentation History: Failure, Hypertension, Type II Diabetes Photos Photo Uploaded By: Alejandro Mulling on 04/22/2016 08:37:32 Wound Measurements Length: (cm) 6.1 Width: (cm) 2 Depth: (cm) 0.3 Area: (cm) 9.582 Volume: (cm) 2.875 % Reduction in Area: 70.6% % Reduction in Volume: 70.6% Epithelialization: None Tunneling: No Undermining: No Wound Description Classification: Grade 2 Wound Margin: Thickened Exudate Amount: Large Exudate Type: Serosanguineous Exudate Color: red, brown Foul Odor After Cleansing: Yes Due to Product Use: No Wound Bed Granulation Amount: Medium (34-66%) Exposed Structure Granulation Quality: Red Fascia Exposed: No Necrotic Amount: Medium (34-66%) Fat Layer Exposed: No Necrotic Quality: Eschar, Adherent Slough Tendon Exposed: Yes IREANNA, FINLAYSON ([id number]) Muscle Exposed: No Joint Exposed: No Bone Exposed: No Periwound Skin Texture Texture Color No Abnormalities Noted: No No Abnormalities Noted: No Callus: No Atrophie Blanche: No Crepitus: No Cyanosis:  No  Excoriation: No Ecchymosis: No Fluctuance: No Erythema: Yes Friable: No Erythema Location: Circumferential Induration: No Hemosiderin Staining: No Localized Edema: No Mottled: No Rash: No Pallor: No Scarring: No Rubor: No Moisture Temperature / Pain No Abnormalities Noted: No Tenderness on Palpation: Yes Dry / Scaly: No Maceration: Yes Moist: Yes Wound Preparation Ulcer Cleansing: Rinsed/Irrigated with Saline Topical Anesthetic Applied: Other: lidocaine 4%, Treatment Notes Wound #9 (Right, Distal Achilles) 1. Cleansed with: Clean wound with Normal Saline 2. Anesthetic Topical Lidocaine 4% cream to wound bed prior to debridement 4. Dressing Applied: Hydrafera Blue 5. Secondary Dressing Applied Dry Gauze Foam Kerlix/Conform 7. Secured with Tape Notes netting Electronic Signature(s) Signed: 04/27/2016 4:53:13 PM By: Alejandro MullingPinkerton, Debra Entered By: Alejandro MullingPinkerton, Debra on 04/22/2016 08:47:05 Natalie RimaHOPKINS, Lanay Y. ([id number]) Natalie RimaHOPKINS, Maribelle Y. ([id number]) -------------------------------------------------------------------------------- Vitals Details Patient Name: Natalie RimaHOPKINS, Kailey Y. Date of Service: 04/22/2016 8:00 AM Medical Record Number: [id number] Patient Account Number: [id number] Date of Birth/Sex: 30-Jul-1933 ([name]) Treating RN: Ashok CordiaPinkerton, Debi Primary Care Physician: Rolm GalaGrandis, Heidi Other Clinician: Referring Physician: Rolm GalaGrandis, Heidi Treating Physician/Extender: Kathreen Cosieroulter, Leah Weeks in Treatment: 18 Vital Signs Time Taken: 08:13 Temperature (F): 97.2 Height (in): 65 Pulse (bpm): 79 Weight (lbs): 160 Respiratory Rate (breaths/min): 18 Body Mass Index (BMI): 26.6 Blood Pressure (mmHg): 123/58 Reference Range: 80 - 120 mg / dl Electronic Signature(s) Signed: 04/27/2016 4:53:13 PM By: Alejandro MullingPinkerton, Debra Entered By: Alejandro MullingPinkerton, Debra on 04/22/2016 08:13:25

## 2016-04-28 NOTE — Progress Notes (Signed)
MUNA, DEMERS ([id number]) Visit Report for 04/22/2016 Chief Complaint Document Details Patient Name: [name], [name] 04/22/2016 8:00 Date of Service: AM Medical Record [id number] Number: Patient Account Number: [id number] 01/25/1934 (82 y.o. Treating RN: Phillis Haggis Date of Birth/Sex: Female) Other Clinician: Primary Care Physician: [name] Treating Nannie Starzyk Referring Physician: [name] Physician/Extender: Weeks in Treatment: 18 Information Obtained from: Patient Chief Complaint Ms. Schlechter presents today for follow-up evaluation of her diabetic foot ulcers and abdominal wound. Electronic Signature(s) Signed: 04/22/2016 9:00:01 AM By: Penne Lash, NP, Vivica Dobosz Entered By: Penne Lash, NP, Astra Gregg on 04/22/2016 09:00:01 [name] ([id number]) -------------------------------------------------------------------------------- Debridement Details Patient Name: Natalie, [name] 04/22/2016 8:00 Date of Service: AM Medical Record [id number] Number: Patient Account Number: [id number] 05/19/1934 (82 y.o. Treating RN: Phillis Haggis Date of Birth/Sex: Female) Other Clinician: Primary Care Physician: [name] Treating Jamaree Hosier Referring Physician: [name] Physician/Extender: Tania Ade in Treatment: 18 Debridement Performed for Wound #9 Right,Distal Achilles Assessment: Performed By: Physician Bonnell Public, NP Debridement: Debridement Pre-procedure Yes - 08:44 Verification/Time Out Taken: Start Time: 08:45 Pain Control: Lidocaine 4% Topical Solution Level: Skin/Subcutaneous Tissue Total Area Debrided (L x 6.4 (cm) x 2 (cm) = 12.8 (cm) W): Tissue and other Viable, Non-Viable, Exudate, Fibrin/Slough, Subcutaneous material debrided: Instrument: Curette Bleeding: Minimum Hemostasis Achieved: Pressure End Time: 08:49 Procedural Pain: 0 Post Procedural Pain: 0 Response to Treatment: Procedure was tolerated well Post Debridement Measurements  of Total Wound Length: (cm) 6.4 Width: (cm) 2 Depth: (cm) 0.3 Volume: (cm) 3.016 Character of Wound/Ulcer Post Requires Further Debridement Debridement: Severity of Tissue Post Debridement: Fat layer exposed Post Procedure Diagnosis Same as Pre-procedure Electronic Signature(s) Signed: 04/22/2016 8:59:39 AM By: Penne Lash, NP, Delante Karapetyan Signed: 04/27/2016 4:53:13 PM By: Adair Laundry ([id number]) Entered By: Penne Lash, NP, Tashay Bozich on 04/22/2016 08:59:39 [name] ([id number]) -------------------------------------------------------------------------------- HPI Details Patient Name: Natalie, [name] 04/22/2016 8:00 Date of Service: AM Medical Record [id number] Number: Patient Account Number: [id number] 01/02/1934 (82 y.o. Treating RN: Phillis Haggis Date of Birth/Sex: Female) Other Clinician: Primary Care Physician: [name] Treating Chyla Schlender Referring Physician: [name] Physician/Extender: Weeks in Treatment: 18 History of Present Illness Location: right posterior heel, right Achilles, right lower quadrant abdomen, right fourth toe amp site Quality: denies pain to any wound Severity: not applicable Timing: denies pain HPI Description: 82 year old patient who is known to be diabetic, was referred to [place] by Dr. Gavin Potters for a right heel ulceration which she's had for a while. She was recently in hospital for a pneumonia and at that time and got delirious and was disoriented and sometime during this time developed a stage II ulcer on her right heel. Her past medical history is significant for bilateral pneumonia which was treated with injectable antibiotics and then to oral Levaquin which he has completed. She also has acute on chronic diastolic CHF, acute on chronic respiratory failure, end-stage renal disease on hemodialysis, atrial fibrillation, recent stroke, diabetes mellitus. The patient and her son are poor historians but from what I  understand she was admitted to the hospital with an acute vascular compromise of her right lower extremity and Dr. Wyn Quaker has done a surgical procedure and we are trying to obtain these notes. There are also some vascular workup done and we will try and obtain these notes. the injury to the left lower quadrant of abdomen and the suprapubic area have been there due to a bruise and have been there for several months and no intervention has been done. 10/11/2015 --  on review of the [facility] records it was noted that the patient was admitted to the hospital on 09/14/2015 with peripheral vascular disease with claudication, end-stage renal disease, pressure ulcer, chronic atrial fibrillation. She was seen by Dr. Wyn Quaker who did her right lower extremity angiogram , angioplasty of the right anterior tibial artery and thrombolysis with TPA of the right popliteal artery, and thrombectomy. She was seen by Dr. Wyn Quaker during this past week and he was pleased with the progress. He did say that if he took her to the operating room for any procedure he would debride the abdominal wound under anesthesia. She was also seen by Dr. Ether Griffins the podiatrist who thought that she may lose her right fourth toe at some stage may need an amputation of this. 10/21/2015 --patient known to Dr. Wyn Quaker and his last office visit from 10/04/2015 has been reviewed. 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 She also had a persistent ulceration of the right heel and markedly this area and a right third and fourth toe and a small scab on the calf but these are dry and seemed to be improving. Patient also has a left carotid endarterectomy and multiple interventions to a right brachiocephalic AV fistula. After the visit he had recommended noninvasive studies to recheck her revascularization. He was off the impression that she  would likely lose the right fourth toe and the third toe was likely to heal. [name] ([id number]) He was concerned about underlying muscle necrosis on her right heel and midfoot. 11/01/2015 -- an echo done in January of this year showed her left ventricular ejection fraction to be about 50-55%. The patient was seen by the PA and Dr. Driscilla Grammes office and the plan was to take her to the operating room soon to have a debridement under anesthesia for the abdominal wall wound, the Achilles tendon on the right leg and amputation of the right fourth toe. The daughter and the patient do not feel that they would be able to undergo hyperbaric oxygen therapy 5 days a week for 6 weeks. 12/17/15; this is a medically complex woman who I note was recently in this clinic however I was not involved with her care. She returns today with multiple wounds; a) she has a wound in the mid abdomen that is been there since March of this year. I note that she is been to the overall for debridement recently. The exact etiology of this wound is not really clear b) left lower quadrant abdominal wound had some sanguinous drainage when she came in here. The patient fell in January and thinks this may have been secondary to a hematoma. c): The patient has 3 wounds on her right leg including a small wound on the right mid calf, a large area over the Achilles which currently has a wound VAC for the last 6 weeks, also a smaller wound on the distal part of the right heel. As far as I understand most of these wounds are currently been dressed with's calcium alginate. According to her daughter the Achilles wound under the wound VAC is doing well d) the patient is had an amputation of her left fifth toe in January and the right fourth toe 6 weeks ago secondary to diabetic PAD e) the patient has chronic renal failure on dialysis for the last 2 years secondary to type 2 diabetes on insulin. The daughter's knowledge there is  been  no biopsy of the abdominal wounds given their current appearance and lack of undefined etiology at have to wonder about calciphylaxis. 12/18/15:Addendum; I have reviewed [facility] I can see no relevant x-rays of the right heel. I note her arteriogram and revascularization of her right lower extremity in April 2017. She had debridement of both abdominal wounds and the right heel and Achilles wound on 11/07/15 at which time she had a right fourth toe ray amputation. The abdominal wounds were debridement again on 6/29. I do not see any relevant pathology of these abdominal wounds 12/24/15; culture I did of the drainage from the midline abdominal wound last week showed both Proteus and ampicillin sensitive enterococcus. I've given her a course of Augmentin adjusted on dialysis days. She has no specific complaints today. Been using Santyl to the abdominal wounds in the right leg wound and the wound VAC on the right Achilles which was initially prescribed by Dr. dew 12/31/15; I have done two punch biopsies of the large midline abdominal. 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 May have been a trauma component of the one on the left lower quadrant however the midline wound had no such history. She has a large area on the right Achilles heel with a wound VAC prescribed by Dr. dew. A small wound on the right anterior leg.Marland Kitchen UNFORTUNATELY she has 2 new wounds today. One on the left heel which is probably a pressure area. As well her previous amputation site of her right fourth toe has dehisced and now has a small wound with significant depth at the amputation site. 01/14/2016 -- she returns after 2 weeks and had had a punch biopsy of abdominal wound done the last visit -- had a biopsy of her midline abdominal wound done and the Pathology diagnosis is that of ulceration, necrosis and inflammation and negative for dysplasia and malignancy. 01/21/16. 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 In the meantime she has new wounds of the left heel the right fourth toe amputation site is opened up. The back is stopped to the right heel area. 01/28/16; the abdominal wounds continued to improve. The extensive wound on her right Achilles also looks stable except for the lower aspect of the wound where there is a large liquefied area that probes right down to her calcaneus. This cultured Proteus last week I have her on Augmentin and doxycycline 1. I think this is JANIN, KOZLOWSKI ([id number]) going to need a course of IV antibiotics and I will call dialysis. X-ray I did last week was negative, I think she is going to need an MRI 02/04/16; right heel MRI as before Saturday. Receiving I believe IV Rocephin at dialysis 02/11/16; as it turns out the patient could not have a MRI as she has a bladder stimulator in place even though it is not currently in use since the beginning of this year. Although she has an allergy to IV contrast she apparently has done well with premedication so we will have to go for a CT scan with contrast. In the meantime she has had a fall now has a large skin tear on her left upper arm. She went to the ER and they suggested Tegaderm over topical antibiotics 03/03/16 currently patient returns after having been hospitalized for 2 weeks and subsequently transferred to [facility] nursing facility. She actually seems to be doing excellent compared to even when we last saw her according to our nursing  staff. Both patient and her daughter are extremely encouraged at how well she is presenting at this point in time. Overall the biggest issue is still the right Achilles area which is being managed at this point in time by Dr. Wyn Quaker. Patient is currently utilizing a wound VAC to that region. 03/17/16; patient is at [facility] nursing home still. Using Aquacel Ag to the wounds on her bilateral feet and still Prisma to the small open area  on her abdomen. 03/31/16 at this point in time patient has been tolerating the dressing changes currently. She fortunately has no worsening of her symptoms although she tells me that the nurse who is caring for her at [facility] nursing facility decided that nothing was needed in regard to the lower abdominal wound from a dressing standpoint at this time. she is really not having significant discomfort or pain at this point she continues to have some tunneling in the proximal Achilles wound region. 04/07/16 patient continues to do well on evaluation today. Even the Achilles wound which has been the most tender is not giving her as much trouble. Unfortunately the PolyMem dressings that we utilize last week really did not seem to benefit her in particular. Obviously we will discontinue that at this point in time today. 04-15-16:Ms. Dunklee is accompanied today by her daughter. She is still residing in an SNF undergoing dialysis, continues to receive antibiotics during dialysis as prescribed by Dr. Sampson Goon of infectious disease. She has a follow-up appointment with Dr. Sampson Goon on 04-17-16 to discuss the continuation of these antibiotics. Ms. Tarazon denies any pain to any of the 3 remaining wounds she does admit to changing to [employer] surgical shoes for safety while ambulating with physical therapy. She denies any falls since her last appointment here although she and her daughter do admit to increased tremors of unclear etiology since her last appointment. She has tolerated the dressing changes that were prescribed last week. 04-22-16 Ms. Clapper presents today accompanied by her daughter for evaluation of her diabetic foot ulcers. She is still residing in an SNF, she continues dialysis 3 times weekly. She'll follow up with Dr. Sampson Goon last Friday, and at that appointment IV antibiotics were discontinued. According to Ms. Broadwell and her daughter if there is any regression of her wounds Dr.  Sampson Goon will consider re-starting the antibiotics. Ms. Stavola daughter states that since the discontinuation of antibiotic therapy her "twitches" have resolved. Electronic Signature(s) Signed: 04/22/2016 9:02:55 AM By: Penne Lash, NP, Monquie Fulgham Entered By: Penne Lash, NP, Rekisha Welling on 04/22/2016 09:02:54 [name] ([id number]) -------------------------------------------------------------------------------- Physical Exam Details Patient Name: MAURISSA, AMBROSE 04/22/2016 8:00 Date of Service: AM Medical Record [id number] Number: Patient Account Number: [id number] 01-Nov-1933 (82 y.o. Treating RN: Phillis Haggis Date of Birth/Sex: Female) Other Clinician: Primary Care Physician: [name] Treating Valoree Agent Referring Physician: [name] Physician/Extender: Weeks in Treatment: 18 Constitutional BP within normal limits. afebrile. well nourished; well developed; appears stated age;Marland Kitchen Respiratory non-labored respiratory effort. Cardiovascular non-palpable DP and PT. rubra noted to right toes, toes and foot cool to touch (this is unchanged). Musculoskeletal arrives in a wheelchair, able to transfer to chair with assistance. Integumentary (Hair, Skin) right lower quadrant abdominal wound is completely epithelialized and healed today; right fourth toe amputation site has a pinhole opening almost immeasurable, continues to have serous drainage when expressed, unable to visualize base of ulcer; the posterior aspect of the right foot was divided into 2 wounds: Right posterior proximal Achilles wound continues to be a Tylenol although decreased  this week in size, also bases unable to be visualized; right posterior heel wound continues to improve with decrease in measurements and with, no change in depth the most distal aspect of that wound the wound is covered with biofilm which is easily removed revealing granular wound base, the most distal aspect of this wound is slough covered  despite debridement. no induration or fluctuation noted to the right fourth toe amputation site; no induration or fluctuation noted to the posterior right lower extremity at the Achilles or heel although there is significant scar tissue at the heel wound. Neurological neuropathic. Psychiatric oriented to time, place, person and situation. calm, pleasant, conversive. Notes I personally spoke with Dr. Festus Barren from vascular surgery on 04-21-2016 Re: Possible surgical exploration of the proximal Achilles wound remains a tunnel at this point and has had variable measurements over the past several weeks and is not consistently making progress. During this conversation Dr. Wyn Quaker stated that at the time of intervention to her AV fistula he is amenable to investigating the Achilles wound for possible surgical intervention. Ms. Akter is scheduled for an angiogram on 11-27- 2017 and surgical intervention for her AV fistula is still pending. These findings were discussed with Ms. Sofranko and her daughter at this appointment and they are aware of the potential surgical intervention per Dr. Wyn Quaker if the Achilles wound does not continue to progress. Electronic Signature(s) Signed: 04/22/2016 9:11:58 AM By: Penne Lash, NP, Ardelle Anton, Vinetta Bergamo ([id number]) Entered By: Penne Lash, NP, Arlina Sabina on 04/22/2016 09:11:58 [name] ([id number]) -------------------------------------------------------------------------------- Physician Orders Details Patient Name: SHAILEE, FOOTS 04/22/2016 8:00 Date of Service: AM Medical Record [id number] Number: Patient Account Number: [id number] August 15, 1933 (82 y.o. Treating RN: Phillis Haggis Date of Birth/Sex: Female) Other Clinician: Primary Care Physician: [name] Treating Lorelei Heikkila Referring Physician: [name] Physician/Extender: Tania Ade in Treatment: 26 Verbal / Phone Orders: Yes Clinician: Ashok Cordia, Debi Read Back and Verified: Yes Diagnosis  Coding Wound Cleansing o Clean wound with Normal Saline. Anesthetic o Topical Lidocaine 4% cream applied to wound bed prior to debridement - all wounds for clinic use only Primary Wound Dressing Wound #14 Right Amputation Site - Digit o Aquacel Ag Wound #9 Right,Distal Achilles o Hydrafera Blue - Ready Transfer Wound #17 Right,Proximal Achilles o Iodoform packing Gauze - pack into 12 o'clock tunnel leave tail out so it can be removed please please pack in at 2 cm Secondary Dressing Wound #14 Right Amputation Site - Digit o Non-adherent pad - band-aide Wound #9 Right,Distal Achilles o Conform/Kerlix - tape, stretch netting #5 o Foam Dressing Change Frequency Wound #14 Right Amputation Site - Digit o Change dressing every other day. Wound #9 Right,Distal Achilles o Change dressing every other day. Follow-up Appointments Wound #14 Right Amputation Site - Digit o Return Appointment in 1 week. KARSTYN, BIRKEY ([id number]) Wound #9 Right,Distal Achilles o Return Appointment in 1 week. Off-Loading Wound #9 Right,Distal Achilles o Other: - Float heels SNF- Please provide a heel offloading boot for HS. Electronic Signature(s) Signed: 04/27/2016 4:53:13 PM By: Alejandro Mulling Signed: 04/28/2016 9:26:16 AM By: Penne Lash, NP, Macaria Bias Entered By: Alejandro Mulling on 04/22/2016 08:50:23 Camrie, Stock Vinetta Bergamo ([id number]) -------------------------------------------------------------------------------- Problem List Details Patient Name: KRISTAIN, FILO 04/22/2016 8:00 Date of Service: AM Medical Record [id number] Number: Patient Account Number: [id number] 08-23-1933 (82 y.o. Treating RN: Phillis Haggis Date of Birth/Sex: Female) Other Clinician: Primary Care Physician: [name] Treating Nick Armel Referring Physician: [name] Physician/Extender: Weeks in Treatment: 18 Active Problems ICD-10 Encounter Code  Description Active  Date Diagnosis M86.671 Other chronic osteomyelitis, right ankle and foot 04/15/2016 Yes E11.621 Type 2 diabetes mellitus with foot ulcer 12/17/2015 Yes L97.512 Non-pressure chronic ulcer of other part of right foot with 03/31/2016 Yes fat layer exposed E11.51 Type 2 diabetes mellitus with diabetic peripheral 12/17/2015 Yes angiopathy without gangrene S31.104A Unspecified open wound of abdominal wall, left lower 12/17/2015 Yes quadrant without penetration into peritoneal cavity, initial encounter S31.103D Unspecified open wound of abdominal wall, right lower 04/15/2016 Yes quadrant without penetration into peritoneal cavity, subsequent encounter Inactive Problems Resolved Problems Electronic Signature(s) Signed: 04/22/2016 8:59:14 AM By: Penne Lash, NP, Kingston Guiles Entered By: Penne Lash, NP, Laraine Samet on 04/22/2016 08:59:14 Drena, Ham Vinetta Bergamo ([id number]) [name] ([id number]) -------------------------------------------------------------------------------- Progress Note Details Patient Name: SITA, MANGEN 04/22/2016 8:00 Date of Service: AM Medical Record [id number] Number: Patient Account Number: [id number] September 09, 1933 (82 y.o. Treating RN: Phillis Haggis Date of Birth/Sex: Female) Other Clinician: Primary Care Physician: [name] Treating Alia Parsley Referring Physician: [name] Physician/Extender: Tania Ade in Treatment: 18 Subjective Chief Complaint Information obtained from Patient Ms. Eskenazi presents today for follow-up evaluation of her diabetic foot ulcers and abdominal wound. History of Present Illness (HPI) The following HPI elements were documented for the patient's wound: Location: right posterior heel, right Achilles, right lower quadrant abdomen, right fourth toe amp site Quality: denies pain to any wound Severity: not applicable Timing: denies pain 82 year old patient who is known to be diabetic, was referred to [place] by Dr. Gavin Potters for a right heel  ulceration which she's had for a while. She was recently in hospital for a pneumonia and at that time and got delirious and was disoriented and sometime during this time developed a stage II ulcer on her right heel. Her past medical history is significant for bilateral pneumonia which was treated with injectable antibiotics and then to oral Levaquin which he has completed. She also has acute on chronic diastolic CHF, acute on chronic respiratory failure, end-stage renal disease on hemodialysis, atrial fibrillation, recent stroke, diabetes mellitus. The patient and her son are poor historians but from what I understand she was admitted to the hospital with an acute vascular compromise of her right lower extremity and Dr. Wyn Quaker has done a surgical procedure and we are trying to obtain these notes. There are also some vascular workup done and we will try and obtain these notes. the injury to the left lower quadrant of abdomen and the suprapubic area have been there due to a bruise and have been there for several months and no intervention has been done. 10/11/2015 -- on review of the [facility] records it was noted that the patient was admitted to the hospital on 09/14/2015 with peripheral vascular disease with claudication, end-stage renal disease, pressure ulcer, chronic atrial fibrillation. She was seen by Dr. Wyn Quaker who did her right lower extremity angiogram , angioplasty of the right anterior tibial artery and thrombolysis with TPA of the right popliteal artery, and thrombectomy. She was seen by Dr. Wyn Quaker during this past week and he was pleased with the progress. He did say that if he took her to the operating room for any procedure he would debride the abdominal wound under anesthesia. She was also seen by Dr. Ether Griffins the podiatrist who thought that she may lose her right fourth toe at some stage may need an amputation of this. ALSACE, DOWD ([id number]) 10/21/2015 --patient known to Dr. Wyn Quaker  and his last office visit from 10/04/2015 has been reviewed. 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 She also had a persistent ulceration of the right heel and markedly this area and a right third and fourth toe and a small scab on the calf but these are dry and seemed to be improving. Patient also has a left carotid endarterectomy and multiple interventions to a right brachiocephalic AV fistula. After the visit he had recommended noninvasive studies to recheck her revascularization. He was off the impression that she would likely lose the right fourth toe and the third toe was likely to heal. He was concerned about underlying muscle necrosis on her right heel and midfoot. 11/01/2015 -- an echo done in January of this year showed her left ventricular ejection fraction to be about 50-55%. The patient was seen by the PA and Dr. Driscilla Grammes office and the plan was to take her to the operating room soon to have a debridement under anesthesia for the abdominal wall wound, the Achilles tendon on the right leg and amputation of the right fourth toe. The daughter and the patient do not feel that they would be able to undergo hyperbaric oxygen therapy 5 days a week for 6 weeks. 12/17/15; this is a medically complex woman who I note was recently in this clinic however I was not involved with her care. She returns today with multiple wounds; a) she has a wound in the mid abdomen that is been there since March of this year. I note that she is been to the overall for debridement recently. The exact etiology of this wound is not really clear b) left lower quadrant abdominal wound had some sanguinous drainage when she came in here. The patient fell in January and thinks this may have been secondary to a hematoma. c): The patient has 3 wounds on her right leg including a small wound on the right  mid calf, a large area over the Achilles which currently has a wound VAC for the last 6 weeks, also a smaller wound on the distal part of the right heel. As far as I understand most of these wounds are currently been dressed with's calcium alginate. According to her daughter the Achilles wound under the wound VAC is doing well d) the patient is had an amputation of her left fifth toe in January and the right fourth toe 6 weeks ago secondary to diabetic PAD e) the patient has chronic renal failure on dialysis for the last 2 years secondary to type 2 diabetes on insulin. The daughter's knowledge there is been no biopsy of the abdominal wounds given their current appearance and lack of undefined etiology at have to wonder about calciphylaxis. 12/18/15:Addendum; I [facility] reviewed [facility] I can see no relevant x-rays of the right heel. I note her arteriogram and revascularization of her right lower extremity in April 2017. She had debridement of both abdominal wounds and the right heel and Achilles wound on 11/07/15 at which time she had a right fourth toe ray amputation. The abdominal wounds were debridement again on 6/29. I do not see any relevant pathology of these abdominal wounds 12/24/15; culture I did of the drainage from the midline abdominal wound last week showed both Proteus and ampicillin sensitive enterococcus. I've given her a course of Augmentin adjusted on dialysis days. She has no specific complaints today. Been using Santyl to the abdominal wounds in the right leg wound and the wound VAC on the right Achilles which was  initially prescribed by Dr. dew 12/31/15; I have done two punch biopsies of the large midline abdominal. 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 May have been a trauma component of the one on the left lower quadrant however the midline wound had no such history. She has a large area on the right Achilles heel with a wound VAC prescribed by Dr. dew. A small wound on the  right anterior leg.Marland Kitchen UNFORTUNATELY she has 2 new wounds today. One on the left heel which is probably a pressure area. As well her previous amputation site of her right fourth toe has dehisced and now has a small wound with significant depth at the amputation site. 01/14/2016 -- she returns after 2 weeks and had had a punch biopsy of abdominal wound done the last visit MARGERY, SZOSTAK ([id number]) -- had a biopsy of her midline abdominal wound done and the Pathology diagnosis is that of ulceration, necrosis and inflammation and negative for dysplasia and malignancy. 01/21/16. 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 In the meantime she has new wounds of the left heel the right fourth toe amputation site is opened up. The back is stopped to the right heel area. 01/28/16; the abdominal wounds continued to improve. The extensive wound on her right Achilles also looks stable except for the lower aspect of the wound where there is a large liquefied area that probes right down to her calcaneus. This cultured Proteus last week I have her on Augmentin and doxycycline 1. I think this is going to need a course of IV antibiotics and I will call dialysis. X-ray I did last week was negative, I think she is going to need an MRI 02/04/16; right heel MRI as before Saturday. Receiving I believe IV Rocephin at dialysis 02/11/16; as it turns out the patient could not have a MRI as she has a bladder stimulator in place even though it is not currently in use since the beginning of this year. Although she has an allergy to IV contrast she apparently has done well with premedication so we will have to go for a CT scan with contrast. In the meantime she has had a fall now has a large skin tear on her left upper arm. She went to the ER and they suggested Tegaderm over topical antibiotics 03/03/16 currently patient returns after having been  hospitalized for 2 weeks and subsequently transferred to [facility] nursing facility. She actually seems to be doing excellent compared to even when we last saw her according to our nursing staff. Both patient and her daughter are extremely encouraged at how well she is presenting at this point in time. Overall the biggest issue is still the right Achilles area which is being managed at this point in time by Dr. Wyn Quaker. Patient is currently utilizing a wound VAC to that region. 03/17/16; patient is at [facility] nursing home still. Using Aquacel Ag to the wounds on her bilateral feet and still Prisma to the small open area on her abdomen. 03/31/16 at this point in time patient has been tolerating the dressing changes currently. She fortunately has no worsening of her symptoms although she tells me that the nurse who is caring for her at [facility] nursing facility decided that nothing was needed in regard to the lower abdominal wound from a dressing standpoint at this time. she is really not having significant discomfort or pain at this point she continues to have some tunneling in  the proximal Achilles wound region. 04/07/16 patient continues to do well on evaluation today. Even the Achilles wound which has been the most tender is not giving her as much trouble. Unfortunately the PolyMem dressings that we utilize last week really did not seem to benefit her in particular. Obviously we will discontinue that at this point in time today. 04-15-16:Ms. Sheffer is accompanied today by her daughter. She is still residing in an SNF undergoing dialysis, continues to receive antibiotics during dialysis as prescribed by Dr. Sampson Goon of infectious disease. She has a follow-up appointment with Dr. Sampson Goon on 04-17-16 to discuss the continuation of these antibiotics. Ms. Zollinger denies any pain to any of the 3 remaining wounds she does admit to changing to [employer] surgical shoes for safety while ambulating with  physical therapy. She denies any falls since her last appointment here although she and her daughter do admit to increased tremors of unclear etiology since her last appointment. She has tolerated the dressing changes that were prescribed last week. 04-22-16 Ms. Seney presents today accompanied by her daughter for evaluation of her diabetic foot ulcers. She is still residing in an SNF, she continues dialysis 3 times weekly. She'll follow up with Dr. Sampson Goon last Friday, and at that appointment IV antibiotics were discontinued. According to Ms. Brys and her daughter if there is any regression of her wounds Dr. Sampson Goon will consider re-starting the antibiotics. Ms. Mccabe daughter states that since the discontinuation of antibiotic therapy her "twitches" have resolved. TYJANAE, BARTEK ([id number]) Objective Constitutional BP within normal limits. afebrile. well nourished; well developed; appears stated age;Marland Kitchen Vitals Time Taken: 8:13 AM, Height: 65 in, Weight: 160 lbs, BMI: 26.6, Temperature: 97.2 F, Pulse: 79 bpm, Respiratory Rate: 18 breaths/min, Blood Pressure: 123/58 mmHg. Respiratory non-labored respiratory effort. Cardiovascular non-palpable DP and PT. rubra noted to right toes, toes and foot cool to touch (this is unchanged). Musculoskeletal arrives in a wheelchair, able to transfer to chair with assistance. Neurological neuropathic. Psychiatric oriented to time, place, person and situation. calm, pleasant, conversive. General Notes: I personally spoke with Dr. Festus Barren from vascular surgery on 04-21-2016 Re: Possible surgical exploration of the proximal Achilles wound remains a tunnel at this point and has had variable measurements over the past several weeks and is not consistently making progress. During this conversation Dr. Wyn Quaker stated that at the time of intervention to her AV fistula he is amenable to investigating the Achilles wound for possible surgical  intervention. Ms. Angst is scheduled for an angiogram on 11-27- 2017 and surgical intervention for her AV fistula is still pending. These findings were discussed with Ms. Longo and her daughter at this appointment and they are aware of the potential surgical intervention per Dr. Wyn Quaker if the Achilles wound does not continue to progress. Integumentary (Hair, Skin) right lower quadrant abdominal wound is completely epithelialized and healed today; right fourth toe amputation site has a pinhole opening almost immeasurable, continues to have serous drainage when expressed, unable to visualize base of ulcer; the posterior aspect of the right foot was divided into 2 wounds: Right posterior proximal Achilles wound continues to be a Tylenol although decreased this week in size, also bases unable to be visualized; right posterior heel wound continues to improve with decrease in measurements and with, no change in depth the most distal aspect of that wound the wound is covered with biofilm which is easily removed revealing granular wound base, the most distal aspect of this wound is slough covered despite debridement. no  induration or fluctuation noted to the right fourth toe amputation site; no induration or fluctuation noted to the posterior right lower extremity at the Achilles or heel although there is significant scar tissue at the heel wound. Wound #11 status is Open. Original cause of wound was Trauma. The wound is located on the Midline Helane RimaHOPKINS, Patt Y. ([id number]) Abdomen - Lower Quadrant. The wound measures 0cm length x 0cm width x 0cm depth; 0cm^2 area and 0cm^3 volume. The wound is limited to skin breakdown. There is no tunneling or undermining noted. There is a none present amount of drainage noted. The wound margin is flat and intact. There is no granulation within the wound bed. There is no necrotic tissue within the wound bed. The periwound skin appearance exhibited: Erythema. The  periwound skin appearance did not exhibit: Callus, Crepitus, Excoriation, Fluctuance, Friable, Induration, Localized Edema, Rash, Scarring, Dry/Scaly, Maceration, Moist, Atrophie Blanche, Cyanosis, Ecchymosis, Hemosiderin Staining, Mottled, Pallor, Rubor. The surrounding wound skin color is noted with erythema. Periwound temperature was noted as No Abnormality. Wound #14 status is Open. Original cause of wound was Surgical Injury. The wound is located on the Right Amputation Site - Digit. The wound measures 0.1cm length x 0.1cm width x 0.1cm depth; 0.008cm^2 area and 0.001cm^3 volume. The wound is limited to skin breakdown. There is no tunneling or undermining noted. There is a small amount of serosanguineous drainage noted. The wound margin is flat and intact. There is large (67-100%) pink granulation within the wound bed. There is a small (1-33%) amount of necrotic tissue within the wound bed including Eschar. The periwound skin appearance exhibited: Dry/Scaly. The periwound skin appearance did not exhibit: Callus, Crepitus, Excoriation, Fluctuance, Friable, Induration, Localized Edema, Rash, Scarring, Maceration, Moist, Atrophie Blanche, Cyanosis, Ecchymosis, Hemosiderin Staining, Mottled, Pallor, Rubor, Erythema. Periwound temperature was noted as No Abnormality. Wound #17 status is Open. Original cause of wound was Gradually Appeared. The wound is located on the Right,Proximal Achilles. The wound measures 0.5cm length x 0.5cm width x 0.5cm depth; 0.196cm^2 area and 0.098cm^3 volume. The wound is limited to skin breakdown. There is tunneling at 12:00 with a maximum distance of 2cm. There is a large amount of serosanguineous drainage noted. The wound margin is distinct with the outline attached to the wound base. There is no granulation within the wound bed. There is a large (67-100%) amount of necrotic tissue within the wound bed including Adherent Slough. Periwound temperature was noted as  No Abnormality. The periwound has tenderness on palpation. Wound #9 status is Open. Original cause of wound was Gradually Appeared. The wound is located on the Right,Distal Achilles. The wound measures 6.1cm length x 2cm width x 0.3cm depth; 9.582cm^2 area and 2.875cm^3 volume. There is tendon exposed. There is no tunneling or undermining noted. There is a large amount of serosanguineous drainage noted. The wound margin is thickened. There is medium (34-66%) red granulation within the wound bed. There is a medium (34-66%) amount of necrotic tissue within the wound bed including Eschar and Adherent Slough. The periwound skin appearance exhibited: Maceration, Moist, Erythema. The periwound skin appearance did not exhibit: Callus, Crepitus, Excoriation, Fluctuance, Friable, Induration, Localized Edema, Rash, Scarring, Dry/Scaly, Atrophie Blanche, Cyanosis, Ecchymosis, Hemosiderin Staining, Mottled, Pallor, Rubor. The surrounding wound skin color is noted with erythema which is circumferential. The periwound has tenderness on palpation. Assessment Active Problems ICD-10 M86.671 - Other chronic osteomyelitis, right ankle and foot E11.621 - Type 2 diabetes mellitus with foot ulcer L97.512 - Non-pressure chronic ulcer of other part  of right foot with fat layer exposed Helane RimaHOPKINS, Mashawn Y. ([id number]) E11.51 - Type 2 diabetes mellitus with diabetic peripheral angiopathy without gangrene S31.104A - Unspecified open wound of abdominal wall, left lower quadrant without penetration into peritoneal cavity, initial encounter S31.103D - Unspecified open wound of abdominal wall, right lower quadrant without penetration into peritoneal cavity, subsequent encounter Procedures Wound #9 Wound #9 is a Diabetic Wound/Ulcer of the Lower Extremity located on the Right,Distal Achilles . There was a Skin/Subcutaneous Tissue Debridement ([id number]) debridement with total area of 12.8 sq cm performed by Bonnell Publicoulter, Attikus Bartoszek,  NP. with the following instrument(s): Curette to remove Viable and Non-Viable tissue/material including Exudate, Fibrin/Slough, and Subcutaneous after achieving pain control using Lidocaine 4% Topical Solution. A time out was conducted at 08:44, prior to the start of the procedure. A Minimum amount of bleeding was controlled with Pressure. The procedure was tolerated well with a pain level of 0 throughout and a pain level of 0 following the procedure. Post Debridement Measurements: 6.4cm length x 2cm width x 0.3cm depth; 3.016cm^3 volume. Character of Wound/Ulcer Post Debridement requires further debridement. Severity of Tissue Post Debridement is: Fat layer exposed. Post procedure Diagnosis Wound #9: Same as Pre-Procedure Plan Wound Cleansing: Clean wound with Normal Saline. Anesthetic: Topical Lidocaine 4% cream applied to wound bed prior to debridement - all wounds for clinic use only Primary Wound Dressing: Wound #14 Right Amputation Site - Digit: Aquacel Ag Wound #9 Right,Distal Achilles: Hydrafera Blue - Ready Transfer Wound #17 Right,Proximal Achilles: Iodoform packing Gauze - pack into 12 o'clock tunnel leave tail out so it can be removed please please pack in at 2 cm Secondary Dressing: Wound #14 Right Amputation Site - Digit: Non-adherent pad - band-aide Wound #9 Right,Distal Achilles: Conform/Kerlix - tape, stretch netting #5 Helane RimaHOPKINS, Savahna Y. ([id number]) Foam Dressing Change Frequency: Wound #14 Right Amputation Site - Digit: Change dressing every other day. Wound #9 Right,Distal Achilles: Change dressing every other day. Follow-up Appointments: Wound #14 Right Amputation Site - Digit: Return Appointment in 1 week. Wound #9 Right,Distal Achilles: Return Appointment in 1 week. Off-Loading: Wound #9 Right,Distal Achilles: Other: - Float heels SNF- Please provide a heel offloading boot for HS. Follow-Up Appointments: A follow-up appointment should be  scheduled. Medication Reconciliation completed and provided to Patient/Care Provider. 1. Continue with dressing changes as ordered over the next week 2. [facility] with [facility] 3. Continue with follow-ups per Dr. Jarrett AblesFitzgerald's recommendations 4. Would consider HBO therapy if the right posterior heel DFU Wagner grade 3 does not make continual progress in light of 6 weeks of IV antibiotic therapy per Dr. Sampson GoonFitzgerald and a confirmed MRI of osteomyelitis to the right calcaneus. We will continue to monitor this area for progression and intervene with advanced modalities as indicated. Electronic Signature(s) Signed: 04/22/2016 9:21:46 AM By: Penne Lashoulter, NP, Tinie Mcgloin Entered By: Penne Lashoulter, NP, Cassidy Tabet on 04/22/2016 09:21:46 Abbe AmsterdamHOPKINS, Vinetta BergamoJOANN Y. ([id number]) -------------------------------------------------------------------------------- SuperBill Details Patient Name: Helane RimaHOPKINS, Bular Y. Date of Service: 04/22/2016 Medical Record Number: [id number] Patient Account Number: [id number] Date of Birth/Sex: Jul 13, 1933 ([name]) Treating RN: Ashok CordiaPinkerton, Debi Primary Care Physician: Rolm GalaGrandis, Heidi Other Clinician: Referring Physician: Rolm GalaGrandis, Heidi Treating Physician/Extender: Kathreen Cosieroulter, Iveliz Garay Weeks in Treatment: 18 Diagnosis Coding ICD-10 Codes Code Description [phone] Other chronic osteomyelitis, right ankle and foot E11.621 Type 2 diabetes mellitus with foot ulcer L97.512 Non-pressure chronic ulcer of other part of right foot with fat layer exposed E11.51 Type 2 diabetes mellitus with diabetic peripheral angiopathy without gangrene Unspecified open wound  of abdominal wall, left lower quadrant without penetration into S31.104A peritoneal cavity, initial encounter Unspecified open wound of abdominal wall, right lower quadrant without penetration into S31.103D peritoneal cavity, subsequent encounter Facility Procedures CPT4 Code: [id number] Description: 11042 - DEB SUBQ TISSUE  20 SQ CM/< ICD-10 Description Diagnosis E11.621 Type 2 diabetes mellitus with foot ulcer M86.671 Other chronic osteomyelitis, right ankle and foo Modifier: t Quantity: 1 Physician Procedures CPT4 Code: [id number] Description: 11042 - WC PHYS SUBQ TISS 20 SQ CM ICD-10 Description Diagnosis E11.621 Type 2 diabetes mellitus with foot ulcer M86.671 Other chronic osteomyelitis, right ankle and foo Modifier: t Quantity: 1 Electronic Signature(s) Signed: 04/22/2016 9:22:20 AM By: Penne Lash, NP, Reggie Pile By: Penne Lash, NP, Darrien Laakso on 04/22/2016 09:22:20

## 2016-04-29 ENCOUNTER — Encounter: Payer: No Typology Code available for payment source | Admitting: Internal Medicine

## 2016-04-29 DIAGNOSIS — E11621 Type 2 diabetes mellitus with foot ulcer: Secondary | ICD-10-CM | POA: Diagnosis not present

## 2016-04-30 NOTE — Progress Notes (Signed)
SANTIA, LABATE ([id number]) Visit Report for 04/29/2016 Arrival Information Details Patient Name: [name], [name]. Date of Service: 04/29/2016 8:00 AM Medical Record Patient Account Number: [id number] Number: Treating RN: Huel Coventry February 29, 1934 (82 [name] Other Clinician: Date of Birth/Sex: Female) Treating ROBSON, MICHAEL Primary Care Physician: [name] Physician/Extender: G Referring Physician: Charolotte Capuchin in Treatment: 19 Visit Information History Since Last Visit Added or deleted any medications: No Patient Arrived: Wheel Chair Any new allergies or adverse reactions: No Arrival Time: 08:13 Had a fall or experienced change in No Accompanied By: daughter activities of daily living that may affect Transfer Assistance: Manual risk of falls: Patient Identification Verified: Yes Signs or symptoms of abuse/neglect No Secondary Verification Process Yes since last visito Completed: Hospitalized since last visit: No Patient Requires Transmission- No Has Dressing in Place as Prescribed: Yes Based Precautions: Has Footwear/Offloading in Place as Yes Patient Has Alerts: Yes Prescribed: Patient Alerts: Patient on Blood Left: Surgical Shoe with Thinner Pressure Relief Insole DMII Right: Surgical Shoe with Warfarin Pressure Relief Insole ABI South  Bilateral NO BP RIGHT Pain Present Now: No ARM Electronic Signature(s) Signed: 04/29/2016 1:49:48 PM By: Elliot Gurney, RN, BSN, Kim RN, BSN Entered By: Elliot Gurney, RN, BSN, Kim on 04/29/2016 08:14:07 Natalie Rima ([id number]) -------------------------------------------------------------------------------- Clinic Level of Care Assessment Details Patient Name: [name], [name]. Date of Service: 04/29/2016 8:00 AM Medical Record Patient Account Number: [id number] Number: Treating RN: Huel Coventry 03/08/1934 (82 [name] Other Clinician: Date of Birth/Sex: Female) Treating ROBSON, MICHAEL Primary Care  Physician: [name] Physician/Extender: G Referring Physician: Charolotte Capuchin in Treatment: 19 Clinic Level of Care Assessment Items TOOL 4 Quantity Score []  - Use when only an EandM is performed on FOLLOW-UP visit 0 ASSESSMENTS - Nursing Assessment / Reassessment []  - Reassessment of Co-morbidities (includes updates in patient status) 0 X - Reassessment of Adherence to Treatment Plan 1 5 ASSESSMENTS - Wound and Skin Assessment / Reassessment X - Simple Wound Assessment / Reassessment - one wound 1 5 []  - Complex Wound Assessment / Reassessment - multiple wounds 0 []  - Dermatologic / Skin Assessment (not related to wound area) 0 ASSESSMENTS - Focused Assessment []  - Circumferential Edema Measurements - multi extremities 0 []  - Nutritional Assessment / Counseling / Intervention 0 []  - Lower Extremity Assessment (monofilament, tuning fork, pulses) 0 []  - Peripheral Arterial Disease Assessment (using hand held doppler) 0 ASSESSMENTS - Ostomy and/or Continence Assessment and Care []  - Incontinence Assessment and Management 0 []  - Ostomy Care Assessment and Management (repouching, etc.) 0 PROCESS - Coordination of Care X - Simple Patient / Family Education for ongoing care 1 15 []  - Complex (extensive) Patient / Family Education for ongoing care 0 X - Staff obtains Chiropractor, Records, Test Results / Process Orders 1 10 []  - Staff telephones HHA, Nursing Homes / Clarify orders / etc 0 [name], [name] ([id number]) []  - Routine Transfer to another Facility (non-emergent condition) 0 []  - Routine Hospital Admission (non-emergent condition) 0 []  - New Admissions / Manufacturing engineer / Ordering NPWT, Apligraf, etc. 0 []  - Emergency Hospital Admission (emergent condition) 0 X - Simple Discharge Coordination 1 10 []  - Complex (extensive) Discharge Coordination 0 PROCESS - Special Needs []  - Pediatric / Minor Patient Management 0 []  - Isolation Patient Management 0 []  -  Hearing / Language / Visual special needs 0 []  - Assessment of Community assistance (transportation, D/C planning, etc.) 0 []  - Additional assistance / Altered mentation 0 []  - Support Surface(s)  Assessment (bed, cushion, seat, etc.) 0 INTERVENTIONS - Wound Cleansing / Measurement []  - Simple Wound Cleansing - one wound 0 X - Complex Wound Cleansing - multiple wounds 3 5 X - Wound Imaging (photographs - any number of wounds) 1 5 []  - Wound Tracing (instead of photographs) 0 []  - Simple Wound Measurement - one wound 0 X - Complex Wound Measurement - multiple wounds 1 5 INTERVENTIONS - Wound Dressings X - Small Wound Dressing one or multiple wounds 3 10 []  - Medium Wound Dressing one or multiple wounds 0 []  - Large Wound Dressing one or multiple wounds 0 []  - Application of Medications - topical 0 []  - Application of Medications - injection 0 [name], [name]. ([id number]) INTERVENTIONS - Miscellaneous []  - External ear exam 0 []  - Specimen Collection (cultures, biopsies, blood, body fluids, etc.) 0 []  - Specimen(s) / Culture(s) sent or taken to Lab for analysis 0 []  - Patient Transfer (multiple staff / Nurse, adultHoyer Lift / Similar devices) 0 []  - Simple Staple / Suture removal (25 or less) 0 []  - Complex Staple / Suture removal (26 or more) 0 []  - Hypo / Hyperglycemic Management (close monitor of Blood Glucose) 0 []  - Ankle / Brachial Index (ABI) - do not check if billed separately 0 X - Vital Signs 1 5 Has the patient been seen at the hospital within the last three years: Yes Total Score: 105 Level Of Care: New/Established - Level 3 Electronic Signature(s) Signed: 04/29/2016 1:49:48 PM By: Elliot GurneyWoody, RN, BSN, Kim RN, BSN Entered By: Elliot GurneyWoody, RN, BSN, Kim on 04/29/2016 08:53:57 [name], Xitlalic [name]. ([id number]) -------------------------------------------------------------------------------- Encounter Discharge Information Details Patient Name: [name], [name]. Date of Service: 04/29/2016 8:00  AM Medical Record Patient Account Number: [id number] Number: Treating RN: Huel CoventryWoody, Kim 1934/04/08 (82 [name] Other Clinician: Date of Birth/Sex: Female) Treating ROBSON, MICHAEL Primary Care Physician: Rolm GalaGrandis, Heidi Physician/Extender: G Referring Physician: Charolotte CapuchinGrandis, Heidi Weeks in Treatment: 6419 Encounter Discharge Information Items Discharge Pain Level: 0 Discharge Condition: Stable Ambulatory Status: Wheelchair Discharge Destination: Nursing Home Transportation: Private Auto Accompanied By: daughtert Schedule Follow-up Appointment: Yes Medication Reconciliation completed and provided to Patient/Care Yes Darshawn Boateng: Provided on Clinical Summary of Care: 04/29/2016 Form Type [facility] Signature[facility] Signed: 04/29/2016 8:55:56 AM By: Gwenlyn PerkingMoore, Shelia Entered By: Gwenlyn PerkingMoore, Shelia on 04/29/2016 08:55:56 [name], [name]. ([id number]) -------------------------------------------------------------------------------- Lower Extremity Assessment Details Patient Name: [name], Chinyere [name]. Date of Service: 04/29/2016 8:00 AM Medical Record Patient Account Number: [id number] Number: Treating RN: Huel CoventryWoody, Kim 1934/04/08 (82 [name] Other Clinician: Date of Birth/Sex: Female) Treating ROBSON, MICHAEL Primary Care Physician: Rolm GalaGrandis, Heidi Physician/Extender: G Referring Physician: Charolotte CapuchinGrandis, Heidi Weeks in Treatment: 19 Vascular Assessment Pulses: Posterior Tibial Dorsalis Pedis Palpable: [Right:No] Doppler: [Right:Monophasic] Extremity colors, hair growth, and conditions: Extremity Color: [Right:Hyperpigmented] Hair Growth on Extremity: [Right:No] Capillary Refill: [Right:> 3 seconds] Dependent Rubor: [Right:No] Blanched when Elevated: [Right:No] Lipodermatosclerosis: [Right:No] Toe Nail Assessment Left: Right: Thick: No Discolored: No Deformed: No Improper Length and Hygiene: No Electronic Signature(s) Signed: 04/29/2016 1:49:48 PM By: Elliot GurneyWoody,  RN, BSN, Kim RN, BSN Entered By: Elliot GurneyWoody, RN, BSN, Kim on 04/29/2016 08:27:05 [name], [name]. ([id number]) -------------------------------------------------------------------------------- Multi Wound Chart Details Patient Name: [name], Kendahl [name]. Date of Service: 04/29/2016 8:00 AM Medical Record Patient Account Number: [id number] Number: Treating RN: Huel CoventryWoody, Kim 1934/04/08 (82 [name] Other Clinician: Date of Birth/Sex: Female) Treating ROBSON, MICHAEL Primary Care Physician: Rolm GalaGrandis, Heidi Physician/Extender: G Referring Physician: Charolotte CapuchinGrandis, Heidi Weeks in Treatment: 19 Vital Signs Height(in): 65 Pulse(bpm): 80  Weight(lbs): 160 Blood Pressure 120/58 (mmHg): Body Mass Index(BMI): 27 Temperature(F): 96.8 Respiratory Rate 16 (breaths/min): Photos: [14:No Photos] [17:No Photos] [9:No Photos] Wound Location: [14:Right Amputation Site - Digit] [17:Right Achilles - Proximal] [9:Right, Distal Achilles] Wounding Event: [14:Surgical Injury] [17:Gradually Appeared] [9:Gradually Appeared] Primary Etiology: [14:Open Surgical Wound] [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] [9:the Lower Extremity] Secondary Etiology: [14:N/A] [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] Comorbid History: [14:Arrhythmia, Congestive Heart Failure, Hypertension, Type II Diabetes] [17:Arrhythmia, Congestive N/A Heart Failure, Hypertension, Type II Diabetes] Date Acquired: [14:11/11/2015] [17:06/03/2015] [9:06/02/2015] Weeks of Treatment: [14:17] [17:1] [9:19] Wound Status: [14:Open] [17:Open] [9:Open] Pending Amputation on No [17:No] [9:Yes] Presentation: Measurements L x W x D 0x0x0 [17:0.5x0.4x0.5] [9:5.5x2x0.2] (cm) Area (cm) : [14:0] [17:0.157] [9:8.639] Volume (cm) : [14:0] [17:0.079] [9:1.728] % Reduction in Area: [14:100.00%] [17:19.90%] [9:73.50%] % Reduction in Volume: 100.00% [17:19.40%] [9:82.30%] Position 1 (o'clock): [17:12] Maximum  Distance 1 [17:2.2] (cm): Tunneling: [14:N/A] [17:Yes] [9:N/A] Classification: [17:Grade 1] [9:Grade 2] Full Thickness Without Exposed Support Structures Exudate Amount: None Present Large N/A Exudate Type: N/A Serosanguineous N/A Exudate Color: N/A red, brown N/A Wound Margin: Flat and Intact Distinct, outline attached N/A Granulation Amount: None Present (0%) None Present (0%) N/A Necrotic Amount: None Present (0%) Large (67-100%) N/A Exposed Structures: Fascia: No Fascia: No N/A Fat: No Fat: No Tendon: No Tendon: No Muscle: No Muscle: No Joint: No Joint: No Bone: No Bone: No Limited to Skin Limited to Skin Breakdown Breakdown Epithelialization: Large (67-100%) None N/A Periwound Skin Texture: Edema: No No Abnormalities Noted No Abnormalities Noted Excoriation: No Induration: No Callus: No Crepitus: No Fluctuance: No Friable: No Rash: No Scarring: No Periwound Skin Dry/Scaly: Yes No Abnormalities Noted No Abnormalities Noted Moisture: Maceration: No Moist: No Periwound Skin Color: Atrophie Blanche: No No Abnormalities Noted No Abnormalities Noted Cyanosis: No Ecchymosis: No Erythema: No Hemosiderin Staining: No Mottled: No Pallor: No Rubor: No Temperature: No Abnormality No Abnormality N/A Tenderness on No Yes No Palpation: Wound Preparation: Ulcer Cleansing: Ulcer Cleansing: N/A Rinsed/Irrigated with Rinsed/Irrigated with Saline Saline Topical Anesthetic Topical Anesthetic Applied: None Applied: Other: lidocaine 4% Treatment Notes [name], [name] ([id number]) Electronic Signature(s) Signed: 04/29/2016 1:49:48 PM By: Elliot Gurney, RN, BSN, Kim RN, BSN Entered By: Elliot Gurney, RN, BSN, Kim on 04/29/2016 08:47:50 Natalie Rima ([id number]) -------------------------------------------------------------------------------- Multi-Disciplinary Care Plan Details Patient Name: [name], [name]. Date of Service: 04/29/2016 8:00 AM Medical Record Patient Account  Number: [id number] Number: Treating RN: Huel Coventry [birth year] (82 [name] Other Clinician: Date of Birth/Sex: Female) Treating ROBSON, MICHAEL Primary Care Physician: [name] Physician/Extender: G Referring Physician: Charolotte Capuchin in Treatment: 10 Active Inactive Abuse / Safety / Falls / Self Care Management Nursing Diagnoses: Impaired physical mobility Potential for falls Goals: Patient will remain injury free Date Initiated: 12/17/2015 Goal Status: Active Interventions: Assess fall risk on admission and as needed Notes: Necrotic Tissue Nursing Diagnoses: Impaired tissue integrity related to necrotic/devitalized tissue Knowledge deficit related to management of necrotic/devitalized tissue Goals: Necrotic/devitalized tissue will be minimized in the wound bed Date Initiated: 02/04/2016 Goal Status: Active Interventions: Assess patient pain level pre-, during and post procedure and prior to discharge Provide education on necrotic tissue and debridement process Treatment Activities: Apply topical anesthetic as ordered : 02/04/2016 Notes: [name], [name] ([id number]) Wound/Skin Impairment Nursing Diagnoses: Impaired tissue integrity Goals: Patient/caregiver will verbalize understanding of skin care regimen Date Initiated: 12/17/2015 Goal Status: Active Ulcer/skin breakdown will have a volume reduction of 30% by week 4 Date Initiated: 12/17/2015 Goal Status:  Active Ulcer/skin breakdown will have a volume reduction of 50% by week 8 Date Initiated: 12/17/2015 Goal Status: Active Ulcer/skin breakdown will have a volume reduction of 80% by week 12 Date Initiated: 12/17/2015 Goal Status: Active Ulcer/skin breakdown will heal within 14 weeks Date Initiated: 12/17/2015 Goal Status: Active Interventions: Assess patient/caregiver ability to obtain necessary supplies Assess patient/caregiver ability to perform ulcer/skin care regimen upon admission and as  needed Assess ulceration(s) every visit Notes: Electronic Signature(s) Signed: 04/29/2016 1:49:48 PM By: Elliot Gurney, RN, BSN, Kim RN, BSN Entered By: Elliot Gurney, RN, BSN, Kim on 04/29/2016 08:47:23 Natalie Rima ([id number]) -------------------------------------------------------------------------------- Pain Assessment Details Patient Name: Natalie Rima. Date of Service: 04/29/2016 8:00 AM Medical Record Patient Account Number: [id number] Number: Treating RN: Huel Coventry 23-Mar-1934 (82 [name] Other Clinician: Date of Birth/Sex: Female) Treating ROBSON, MICHAEL Primary Care Physician: [name] Physician/Extender: G Referring Physician: Charolotte Capuchin in Treatment: 19 Active Problems Location of Pain Severity and Description of Pain Patient Has Paino No Site Locations With Dressing Change: No Pain Management and Medication Current Pain Management: Electronic Signature(s) Signed: 04/29/2016 1:49:48 PM By: Elliot Gurney, RN, BSN, Kim RN, BSN Entered By: Elliot Gurney, RN, BSN, Kim on 04/29/2016 08:14:14 Natalie Rima ([id number]) -------------------------------------------------------------------------------- Patient/Caregiver Education Details Patient Name: Natalie Rima Date of Service: 04/29/2016 8:00 AM Medical Record Patient Account Number: [id number] Number: Treating RN: Huel Coventry 03/02/1934 (82 [name] Other Clinician: Date of Birth/Gender: Female) Treating ROBSON, MICHAEL Primary Care Physician: [name] Physician/Extender: G Referring Physician: Charolotte Capuchin in Treatment: 10 Education Assessment Education Provided To: Patient Education Topics Provided Wound Debridement: Handouts: Wound Debridement Responses: State content correctly Electronic Signature(s) Signed: 04/29/2016 1:49:48 PM By: Elliot Gurney, RN, BSN, Kim RN, BSN Entered By: Elliot Gurney, RN, BSN, Kim on 04/29/2016 08:54:50 Natalie Rima  ([id number]) -------------------------------------------------------------------------------- Wound Assessment Details Patient Name: Natalie Rima. Date of Service: 04/29/2016 8:00 AM Medical Record Patient Account Number: [id number] Number: Treating RN: Huel Coventry Feb 11, 1934 (82 [name] Other Clinician: Date of Birth/Sex: Female) Treating ROBSON, MICHAEL Primary Care Physician: [name] Physician/Extender: G Referring Physician: Charolotte Capuchin in Treatment: 19 Wound Status Wound Number: 14 Primary Open Surgical Wound Etiology: Wound Location: Right Amputation Site - Digit Wound Open Wounding Event: Surgical Injury Status: Date Acquired: 11/11/2015 Comorbid Arrhythmia, Congestive Heart Failure, Weeks Of Treatment: 17 History: Hypertension, Type II Diabetes Clustered Wound: No Photos Photo Uploaded By: Elliot Gurney, RN, BSN, Kim on 04/30/2016 11:35:28 Wound Measurements Length: (cm) 0 % Reduction i Width: (cm) 0 % Reduction i Depth: (cm) 0 Epithelializa Area: (cm) 0 Volume: (cm) 0 n Area: 100% n Volume: 100% tion: Large (67-100%) Wound Description Full Thickness Without Exposed Classification: Support Structures Wound Margin: Flat and Intact Exudate None Present Amount: Foul Odor After Cleansing: No Wound Bed Granulation Amount: None Present (0%) Exposed Structure Necrotic Amount: None Present (0%) Fascia Exposed: No Fat Layer Exposed: No Tendon Exposed: No Muscle Exposed: No JULIANI, LADUKE ([id number]) Joint Exposed: No Bone Exposed: No Limited to Skin Breakdown Periwound Skin Texture Texture Color No Abnormalities Noted: No No Abnormalities Noted: No Callus: No Atrophie Blanche: No Crepitus: No Cyanosis: No Excoriation: No Ecchymosis: No Fluctuance: No Erythema: No Friable: No Hemosiderin Staining: No Induration: No Mottled: No Localized Edema: No Pallor: No Rash: No Rubor: No Scarring: No Temperature / Pain Moisture  Temperature: No Abnormality No Abnormalities Noted: No Dry / Scaly: Yes Maceration: No Moist: No Wound Preparation Ulcer Cleansing: Rinsed/Irrigated with Saline Topical Anesthetic Applied: None Electronic Signature(s) Signed: 04/29/2016  1:49:48 PM By: Elliot GurneyWoody, RN, BSN, Kim RN, BSN Entered By: Elliot GurneyWoody, RN, BSN, Kim on 04/29/2016 08:42:07 [name], [name]. ([id number]) -------------------------------------------------------------------------------- Wound Assessment Details Patient Name: [name], [name]. Date of Service: 04/29/2016 8:00 AM Medical Record Patient Account Number: [id number] Number: Treating RN: Huel CoventryWoody, Kim 1933/08/01 (82 [name] Other Clinician: Date of Birth/Sex: Female) Treating ROBSON, MICHAEL Primary Care Physician: Rolm GalaGrandis, Heidi Physician/Extender: G Referring Physician: Charolotte CapuchinGrandis, Heidi Weeks in Treatment: 19 Wound Status Wound Number: 17 Primary Diabetic Wound/Ulcer of the Lower Etiology: Extremity Wound Location: Right Achilles - Proximal Secondary Arterial Insufficiency Ulcer Wounding Event: Gradually Appeared Etiology: Date Acquired: 06/03/2015 Wound Open Weeks Of Treatment: 1 Status: Clustered Wound: No Comorbid Arrhythmia, Congestive Heart History: Failure, Hypertension, Type II Diabetes Photos Photo Uploaded By: Elliot GurneyWoody, RN, BSN, Kim on 04/30/2016 11:35:28 Wound Measurements Length: (cm) 0.5 % Reduction in Width: (cm) 0.4 % Reduction in Depth: (cm) 0.5 Epithelializati Area: (cm) 0.157 Tunneling: Volume: (cm) 0.079 Position (o Maximum Dist Area: 19.9% Volume: 19.4% on: None Yes 'clock): 12 ance: (cm) 2.2 Wound Description Classification: Grade 1 Foul Odor Afte Wound Margin: Distinct, outline attached Exudate Amount: Large Exudate Type: Serosanguineous Exudate Color: red, brown r Cleansing: No Wound Bed [name], [name]. ([id number]) Granulation Amount: None Present (0%) Exposed Structure Necrotic Amount: Large (67-100%) Fascia  Exposed: No Necrotic Quality: Adherent Slough Fat Layer Exposed: No Tendon Exposed: No Muscle Exposed: No Joint Exposed: No Bone Exposed: No Limited to Skin Breakdown Periwound Skin Texture Texture Color No Abnormalities Noted: No No Abnormalities Noted: No Moisture Temperature / Pain No Abnormalities Noted: No Temperature: No Abnormality Tenderness on Palpation: Yes Wound Preparation Ulcer Cleansing: Rinsed/Irrigated with Saline Topical Anesthetic Applied: Other: lidocaine 4%, Treatment Notes Wound #17 (Right, Proximal Achilles) 1. Cleansed with: Clean wound with Normal Saline 2. Anesthetic Topical Lidocaine 4% cream to wound bed prior to debridement 4. Dressing Applied: Iodoform packing Gauze 5. Secondary Dressing Applied Dry Gauze Foam Kerlix/Conform 7. Secured with Tape Notes [employer] social research officernetting Electronic Signature(s) Signed: 04/29/2016 1:49:48 PM By: Elliot GurneyWoody, RN, BSN, Kim RN, BSN Entered By: Elliot GurneyWoody, RN, BSN, Kim on 04/29/2016 08:38:25 [name], [name]. ([id number]) -------------------------------------------------------------------------------- Wound Assessment Details Patient Name: [name], [name]. Date of Service: 04/29/2016 8:00 AM Medical Record Patient Account Number: [id number] Number: Treating RN: Huel CoventryWoody, Kim 1933/08/01 (82 [name] Other Clinician: Date of Birth/Sex: Female) Treating ROBSON, MICHAEL Primary Care Physician: Rolm GalaGrandis, Heidi Physician/Extender: G Referring Physician: Charolotte CapuchinGrandis, Heidi Weeks in Treatment: 19 Wound Status Wound Number: 9 Primary Etiology: Diabetic Wound/Ulcer of the Lower Extremity Wound Location: Right, Distal Achilles Secondary Arterial Insufficiency Ulcer Wounding Event: Gradually Appeared Etiology: Date Acquired: 06/02/2015 Wound Status: Open Weeks Of Treatment: 19 Clustered Wound: No Pending Amputation On Presentation Photos Photo Uploaded By: Elliot GurneyWoody, RN, BSN, Kim on 04/30/2016 11:35:58 Wound Measurements Length: (cm)  5.5 Width: (cm) 2 Depth: (cm) 0.2 Area: (cm) 8.639 Volume: (cm) 1.728 % Reduction in Area: 73.5% % Reduction in Volume: 82.3% Wound Description Classification: Grade 2 Periwound Skin Texture Texture Color No Abnormalities Noted: No No Abnormalities Noted: No Moisture No Abnormalities Noted: No Treatment Notes Wound #9 (Right, Distal Achilles) Abbe AmsterdamHOPKINS, Vinetta BergamoJOANN [name]. ([id number]) 1. Cleansed with: Clean wound with Normal Saline 2. Anesthetic Topical Lidocaine 4% cream to wound bed prior to debridement 4. Dressing Applied: Iodoform packing Gauze 5. Secondary Dressing Applied Dry Gauze Foam Kerlix/Conform 7. Secured with Tape Notes [employer] social research officernetting Electronic Signature(s) Signed: 04/29/2016 1:49:48 PM By: Elliot GurneyWoody, RN, BSN, Kim RN, BSN Entered By: Elliot GurneyWoody, RN, BSN, Kim on 04/29/2016 08:28:19 [name], [name]. (  [id number]) -------------------------------------------------------------------------------- Vitals Details Patient Name: DIERA, WIRKKALA. Date of Service: 04/29/2016 8:00 AM Medical Record Patient Account Number: [id number] Number: Treating RN: Huel Coventry May 21, 1934 (82 [name] Other Clinician: Date of Birth/Sex: Female) Treating ROBSON, MICHAEL Primary Care Physician: [name] Physician/Extender: G Referring Physician: Charolotte Capuchin in Treatment: 19 Vital Signs Time Taken: 08:14 Temperature (F): 96.8 Height (in): 65 Pulse (bpm): 80 Weight (lbs): 160 Respiratory Rate (breaths/min): 16 Body Mass Index (BMI): 26.6 Blood Pressure (mmHg): 120/58 Reference Range: 80 - 120 mg / dl Electronic Signature(s) Signed: 04/29/2016 1:49:48 PM By: Elliot Gurney, RN, BSN, Kim RN, BSN Entered By: Elliot Gurney, RN, BSN, Kim on 04/29/2016 78:29:56

## 2016-04-30 NOTE — Progress Notes (Signed)
[name] ([id number]) Visit Report for 04/29/2016 Chief Complaint Document Details Patient Name: [name], [name]. Date of Service: 04/29/2016 8:00 AM Medical Record Patient Account Number: [id number] Number: Treating RN: [name] 1934/04/08 (82 [name] Other Clinician: Date of Birth/Sex: Female) Treating [name] Primary Care Physician/Extender: [name] Physician: Referring Physician: Charolotte [name] in Treatment: 33 Information Obtained from: Patient Chief Complaint [name] presents today for follow-up evaluation of her diabetic foot ulcers and abdominal wound. Electronic Signature(s) Signed: 04/29/2016 4:55:30 PM By: Baltazar Najjar MD Entered By: Baltazar Najjar on 04/29/2016 12:35:20 [name] ([id number]) -------------------------------------------------------------------------------- HPI Details Patient Name: [name], [name]. Date of Service: 04/29/2016 8:00 AM Medical Record Patient Account Number: [id number] Number: Treating RN: [name] [birth year] (82 [name] Other Clinician: Date of Birth/Sex: Female) Treating [name] Primary Care Physician/Extender: [name] Physician: Referring Physician: Charolotte [name] in Treatment: 19 History of Present Illness Location: right posterior heel, right Achilles, right lower quadrant abdomen, right fourth toe amp site Quality: denies pain to any wound Severity: not applicable Timing: denies pain HPI Description: 82 year old patient who is known to be diabetic, was referred to [place] by Dr. Gavin [name] for a right heel ulceration which she's had for a while. She was recently in hospital for a pneumonia and at that time and got delirious and was disoriented and sometime during this time developed a stage II ulcer on her right heel. Her past medical history is significant for bilateral pneumonia which was treated with injectable antibiotics and then to oral  Levaquin which he has completed. She also has acute on chronic diastolic CHF, acute on chronic respiratory failure, end-stage renal disease on hemodialysis, atrial fibrillation, recent stroke, diabetes mellitus. The patient and her son are poor historians but from what I understand she was admitted to the hospital with an acute vascular compromise of her right lower extremity and Dr. Wyn [name] has done a surgical procedure and we are trying to obtain these notes. There are also some vascular workup done and we will try and obtain these notes. the injury to the left lower quadrant of abdomen and the suprapubic area have been there due to a bruise and have been there for several months and no intervention has been done. 10/11/2015 -- on review of the electronics records it was noted that the patient was admitted to the hospital on 09/14/2015 with peripheral vascular disease with claudication, end-stage renal disease, pressure ulcer, chronic atrial fibrillation. She was seen by Dr. Wyn [name] who did her right lower extremity angiogram , angioplasty of the right anterior tibial artery and thrombolysis with TPA of the right popliteal artery, and thrombectomy. She was seen by Dr. Wyn [name] during this past week and he was pleased with the progress. He did say that if he took her to the operating room for any procedure he would debride the abdominal wound under anesthesia. She was also seen by Dr. Ether [name] the podiatrist who thought that she may lose her right fourth toe at some stage may need an amputation of this. 10/21/2015 --patient known to Dr. Wyn [name] and his last office visit from 10/04/2015 has been reviewed. 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 She also had a persistent ulceration of the right heel and markedly this area and a right third and fourth toe and a small scab on the calf but these  are dry and seemed  to be improving. Patient also has a left carotid endarterectomy and multiple interventions to a right brachiocephalic AV fistula. After the visit he had recommended noninvasive studies to recheck her revascularization. [name] ([id number]) He was off the impression that she would likely lose the right fourth toe and the third toe was likely to heal. He was concerned about underlying muscle necrosis on her right heel and midfoot. 11/01/2015 -- an echo done in January of this year showed her left ventricular ejection fraction to be about 50-55%. The patient was seen by the PA and Dr. Driscilla [name] office and the plan was to take her to the operating room soon to have a debridement under anesthesia for the abdominal wall wound, the Achilles tendon on the right leg and amputation of the right fourth toe. The daughter and the patient do not feel that they would be able to undergo hyperbaric oxygen therapy 5 days a week for 6 weeks. 12/17/15; this is a medically complex woman who I note was recently in this clinic however I was not involved with her care. She returns today with multiple wounds; a) she has a wound in the mid abdomen that is been there since March of this year. I note that she is been to the overall for debridement recently. The exact etiology of this wound is not really clear b) left lower quadrant abdominal wound had some sanguinous drainage when she came in here. The patient fell in January and thinks this may have been secondary to a hematoma. c): The patient has 3 wounds on her right leg including a small wound on the right mid calf, a large area over the Achilles which currently has a wound VAC for the last 6 weeks, also a smaller wound on the distal part of the right heel. As far as I understand most of these wounds are currently been dressed with's calcium alginate. According to her daughter the Achilles wound under the wound VAC is doing well d) the  patient is had an amputation of her left fifth toe in January and the right fourth toe 6 weeks ago secondary to diabetic PAD e) the patient has chronic renal failure on dialysis for the last 2 years secondary to type 2 diabetes on insulin. The daughter's knowledge there is been no biopsy of the abdominal wounds given their current appearance and lack of undefined etiology at have to wonder about calciphylaxis. 12/18/15:Addendum; I [facility] I can see no relevant x-rays of the right heel. I note her arteriogram and revascularization of her right lower extremity in April 2017. She had debridement of both abdominal wounds and the right heel and Achilles wound on 11/07/15 at which time she had a right fourth toe ray amputation. The abdominal wounds were debridement again on 6/29. I do not see any relevant pathology of these abdominal wounds 12/24/15; culture I did of the drainage from the midline abdominal wound last week showed both Proteus and ampicillin sensitive enterococcus. I've given her a course of Augmentin adjusted on dialysis days. She has no specific complaints today. Been using Santyl to the abdominal wounds in the right leg wound and the wound VAC on the right Achilles which was initially prescribed by Dr. dew 12/31/15; I have done two punch biopsies of the large midline abdominal. 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 May have been a trauma component of the one on the left lower quadrant however the midline wound had no such history. She has a large area on  the right Achilles heel with a wound VAC prescribed by Dr. dew. A small wound on the right anterior leg.Marland Kitchen UNFORTUNATELY she has 2 new wounds today. One on the left heel which is probably a pressure area. As well her previous amputation site of her right fourth toe has dehisced and now has a small wound with significant depth at the amputation site. 01/14/2016 -- she returns after 2 weeks and had had a punch biopsy of  abdominal wound done the last visit -- had a biopsy of her midline abdominal wound done and the Pathology diagnosis is that of ulceration, necrosis and inflammation and negative for dysplasia and malignancy. 01/21/16. 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 In the meantime she has new wounds of the left heel the right fourth toe amputation site is opened up. The back is stopped to the right heel area. 01/28/16; the abdominal wounds continued to improve. The extensive wound on her right Achilles also looks stable except for the lower aspect of the wound where there is a large liquefied area that probes right down [name], [name] ([id number]) to her calcaneus. This cultured Proteus last week I have her on Augmentin and doxycycline 1. I think this is going to need a course of IV antibiotics and I will call dialysis. X-ray I did last week was negative, I think she is going to need an MRI 02/04/16; right heel MRI as before Saturday. Receiving I believe IV Rocephin at dialysis 02/11/16; as it turns out the patient could not have a MRI as she has a bladder stimulator in place even though it is not currently in use since the beginning of this year. Although she has an allergy to IV contrast she apparently has done well with premedication so we will have to go for a CT scan with contrast. In the meantime she has had a fall now has a large skin tear on her left upper arm. She went to the ER and they suggested Tegaderm over topical antibiotics 03/03/16 currently patient returns after having been hospitalized for 2 weeks and subsequently transferred to [facility] nursing facility. She actually seems to be doing excellent compared to even when we last saw her according to our nursing staff. Both patient and her daughter are extremely encouraged at how well she is presenting at this point in time. Overall the biggest issue is still the right  Achilles area which is being managed at this point in time by Dr. Wyn [name]. Patient is currently utilizing a wound VAC to that region. 03/17/16; patient is at [facility] nursing home still. Using Aquacel Ag to the wounds on her bilateral feet and still Prisma to the small open area on her abdomen. 03/31/16 at this point in time patient has been tolerating the dressing changes currently. She fortunately has no worsening of her symptoms although she tells me that the nurse who is caring for her at [facility] nursing facility decided that nothing was needed in regard to the lower abdominal wound from a dressing standpoint at this time. she is really not having significant discomfort or pain at this point she continues to have some tunneling in the proximal Achilles wound region. 04/07/16 patient continues to do well on evaluation today. Even the Achilles wound which has been the most tender is not giving her as much trouble. Unfortunately the PolyMem dressings that we utilize last week really did not seem to benefit her in particular.  Obviously we will discontinue that at this point in time today. 04-15-16:Ms. Daris is accompanied today by her daughter. She is still residing in an SNF undergoing dialysis, continues to receive antibiotics during dialysis as prescribed by Dr. Sampson Goon of infectious disease. She has a follow-up appointment with Dr. Sampson Goon on 04-17-16 to discuss the continuation of these antibiotics. Ms. Friberg denies any pain to any of the 3 remaining wounds she does admit to changing to [employer] surgical shoes for safety while ambulating with physical therapy. She denies any falls since her last appointment here although she and her daughter do admit to increased tremors of unclear etiology since her last appointment. She has tolerated the dressing changes that were prescribed last week. 04-22-16 Ms. Bowns presents today accompanied by her daughter for evaluation of her diabetic  foot ulcers. She is still residing in an SNF, she continues dialysis 3 times weekly. She'll follow up with Dr. Sampson Goon last Friday, and at that appointment IV antibiotics were discontinued. According to Ms. Beeghly and her daughter if there is any regression of her wounds Dr. Sampson Goon will consider re-starting the antibiotics. Ms. Baltes daughter states that since the discontinuation of antibiotic therapy her "twitches" have resolved. 04/29/16; I have not seen this patient in quite some time however she is completed triple antibiotic therapy given at dialysis for osteomyelitis as prescribed by infectious disease. She is still being followed by Dr. dew of vascular surgery. We are using Hydrofera Blue to the surface of these wounds. She currently has 2 open areas a substantial area over her Achilles area although this is a lot better than the last time I saw this. She also has a small wound superiorly from this wound which has a superior probing depth of 2 cm. Apparently the measurement of this depth as vacillated quite of bit from appointment to appointment making it difficult to know if we are improving at all [name], [name] ([id number]) Electronic Signature(s) Signed: 04/29/2016 4:55:30 PM By: Baltazar Najjar MD Entered By: Baltazar Najjar on 04/29/2016 12:40:05 [name] ([id number]) -------------------------------------------------------------------------------- Physical Exam Details Patient Name: [name] Date of Service: 04/29/2016 8:00 AM Medical Record Patient Account Number: [id number] Number: Treating RN: [name] Apr 23, 1934 (82 [name] Other Clinician: Date of Birth/Sex: Female) Treating [name] Primary Care Physician/Extender: [name] Physician: Referring Physician: Charolotte [name] in Treatment: 19 Constitutional Sitting or standing Blood Pressure is within target range for patient.. Pulse regular and within target  range for patient.Marland Kitchen Respirations regular, non-labored and within target range.. Temperature is normal and within the target range for the patient.. Eyes Conjunctivae clear. No discharge.Marland Kitchen Respiratory Respiratory effort is easy and symmetric bilaterally. Rate is normal at rest and on room air.. Gastrointestinal (GI) No masses. No liver or spleen enlargement or tenderness.. 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 These were initially substantial.. Psychiatric No evidence of depression, anxiety, or agitation. Calm, cooperative, and communicative. Appropriate interactions and affect.. Notes Wound exam; the area in question is on her Achilles. She has a large superficial wound with a healthy appearing granular base however she does have rolled wound edges. A small probing area superiorly of 2 cm. There is no drainage here no tenderness and no evidence of infection around either area Electronic Signature(s) Signed: 04/29/2016 4:55:30 PM By: Baltazar Najjar MD Entered By: Baltazar Najjar on 04/29/2016 12:47:34 [name] ([id number]) -------------------------------------------------------------------------------- Physician Orders Details Patient Name: [name]. Date of Service: 04/29/2016 8:00 AM Medical Record  Patient Account Number: [id number] Number: Treating RN: [name] 20-Feb-1934 (82 [name] Other Clinician: Date of Birth/Sex: Female) Treating [name] Primary Care Physician/Extender: [name] Physician: Referring Physician: Charolotte [name] in Treatment: 23 Verbal / Phone Orders: Yes Clinician: Huel [name] Read Back and Verified: Yes Diagnosis Coding Wound Cleansing Wound #17 Right,Proximal Achilles o Clean wound with Normal Saline. Wound #9 Right,Distal Achilles o Clean wound with Normal Saline. Anesthetic Wound #9 Right,Distal Achilles o Topical Lidocaine 4% cream applied to wound bed  prior to debridement - all wounds for clinic use only Primary Wound Dressing Wound #17 Right,Proximal Achilles o Iodoform packing Gauze - pack into 12 o'clock tunnel leave tail out so it can be removed please please pack in at 2 cm; DO NOT OVERPACK! Wound #9 Right,Distal Achilles o Hydrafera Blue - Ready Transfer Secondary Dressing Wound #9 Right,Distal Achilles o Conform/Kerlix - tape, stretch netting #5 o Foam Dressing Change Frequency Wound #17 Right,Proximal Achilles o Change dressing every other day. Wound #9 Right,Distal Achilles o Change dressing every other day. Follow-up Appointments ELIDA, HARBIN ([id number]) Wound #17 Right,Proximal Achilles o Return Appointment in 1 week. Wound #9 Right,Distal Achilles o Return Appointment in 1 week. Off-Loading Wound #9 Right,Distal Achilles o Other: - Float heels SNF- Please provide a heel offloading boot for HS. Electronic Signature(s) Signed: 04/29/2016 1:49:48 PM By: Elliot Gurney RN, BSN, Kim RN, BSN Signed: 04/29/2016 4:55:30 PM By: Baltazar Najjar MD Entered By: Elliot Gurney RN, BSN, Kim on 04/29/2016 08:49:17 [name], [name] ([id number]) -------------------------------------------------------------------------------- Problem List Details Patient Name: [name], [name]. Date of Service: 04/29/2016 8:00 AM Medical Record Patient Account Number: [id number] Number: Treating RN: [name] [birth year] (82 [name] Other Clinician: Date of Birth/Sex: Female) Treating [name] Primary Care Physician/Extender: [name] Physician: Referring Physician: Charolotte [name] in Treatment: 19 Active Problems ICD-10 Encounter Code Description Active Date Diagnosis M86.671 Other chronic osteomyelitis, right ankle and foot 04/15/2016 Yes E11.621 Type 2 diabetes mellitus with foot ulcer 12/17/2015 Yes L97.512 Non-pressure chronic ulcer of other part of right foot with 03/31/2016 Yes fat layer  exposed E11.51 Type 2 diabetes mellitus with diabetic peripheral 12/17/2015 Yes angiopathy without gangrene S31.104A Unspecified open wound of abdominal wall, left lower 12/17/2015 Yes quadrant without penetration into peritoneal cavity, initial encounter S31.103D Unspecified open wound of abdominal wall, right lower 04/15/2016 Yes quadrant without penetration into peritoneal cavity, subsequent encounter Inactive Problems Resolved Problems Electronic Signature(s) Signed: 04/29/2016 4:55:30 PM By: Baltazar Najjar MD [name] ([id number]) Entered By: Baltazar Najjar on 04/29/2016 12:35:01 [name] ([id number]) -------------------------------------------------------------------------------- Progress Note Details Patient Name: [name] Date of Service: 04/29/2016 8:00 AM Medical Record Patient Account Number: [id number] Number: Treating RN: [name] 02-Dec-1933 (82 [name] Other Clinician: Date of Birth/Sex: Female) Treating [name] Primary Care Physician/Extender: [name] Physician: Referring Physician: Charolotte [name] in Treatment: 77 Subjective Chief Complaint Information obtained from Patient Ms. Kellenberger presents today for follow-up evaluation of her diabetic foot ulcers and abdominal wound. History of Present Illness (HPI) The following HPI elements were documented for the patient's wound: Location: right posterior heel, right Achilles, right lower quadrant abdomen, right fourth toe amp site Quality: denies pain to any wound Severity: not applicable Timing: denies pain 82 year old patient who is known to be diabetic, was referred to [place] by Dr. Gavin [name] for a right heel ulceration which she's had for a while. She was recently in hospital for a pneumonia and at that time and got delirious  and was disoriented and sometime during this time developed a stage II ulcer on her right heel. Her past medical history is significant  for bilateral pneumonia which was treated with injectable antibiotics and then to oral Levaquin which he has completed. She also has acute on chronic diastolic CHF, acute on chronic respiratory failure, end-stage renal disease on hemodialysis, atrial fibrillation, recent stroke, diabetes mellitus. The patient and her son are poor historians but from what I understand she was admitted to the hospital with an acute vascular compromise of her right lower extremity and Dr. Wyn [name] has done a surgical procedure and we are trying to obtain these notes. There are also some vascular workup done and we will try and obtain these notes. the injury to the left lower quadrant of abdomen and the suprapubic area have been there due to a bruise and have been there for several months and no intervention has been done. 10/11/2015 -- on review of the [facility] records it was noted that the patient was admitted to the hospital on 09/14/2015 with peripheral vascular disease with claudication, end-stage renal disease, pressure ulcer, chronic atrial fibrillation. She was seen by Dr. Wyn [name] who did her right lower extremity angiogram , angioplasty of the right anterior tibial artery and thrombolysis with TPA of the right popliteal artery, and thrombectomy. She was seen by Dr. Wyn [name] during this past week and he was pleased with the progress. He did say that if he took her to the operating room for any procedure he would debride the abdominal wound under anesthesia. She was also seen by Dr. Ether [name] the podiatrist who thought that she may lose her right fourth toe at some stage may need an amputation of this. [name], [name] ([id number]) 10/21/2015 --patient known to Dr. Wyn [name] and his last office visit from 10/04/2015 has been reviewed. 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 She also had a persistent  ulceration of the right heel and markedly this area and a right third and fourth toe and a small scab on the calf but these are dry and seemed to be improving. Patient also has a left carotid endarterectomy and multiple interventions to a right brachiocephalic AV fistula. After the visit he had recommended noninvasive studies to recheck her revascularization. He was off the impression that she would likely lose the right fourth toe and the third toe was likely to heal. He was concerned about underlying muscle necrosis on her right heel and midfoot. 11/01/2015 -- an echo done in January of this year showed her left ventricular ejection fraction to be about 50-55%. The patient was seen by the PA and Dr. Driscilla [name] office and the plan was to take her to the operating room soon to have a debridement under anesthesia for the abdominal wall wound, the Achilles tendon on the right leg and amputation of the right fourth toe. The daughter and the patient do not feel that they would be able to undergo hyperbaric oxygen therapy 5 days a week for 6 weeks. 12/17/15; this is a medically complex woman who I note was recently in this clinic however I was not involved with her care. She returns today with multiple wounds; a) she has a wound in the mid abdomen that is been there since March of this year. I note that she is been to the overall for debridement recently. The exact etiology of this wound is not really  clear b) left lower quadrant abdominal wound had some sanguinous drainage when she came in here. The patient fell in January and thinks this may have been secondary to a hematoma. c): The patient has 3 wounds on her right leg including a small wound on the right mid calf, a large area over the Achilles which currently has a wound VAC for the last 6 weeks, also a smaller wound on the distal part of the right heel. As far as I understand most of these wounds are currently been dressed with's calcium alginate.  According to her daughter the Achilles wound under the wound VAC is doing well d) the patient is had an amputation of her left fifth toe in January and the right fourth toe 6 weeks ago secondary to diabetic PAD e) the patient has chronic renal failure on dialysis for the last 2 years secondary to type 2 diabetes on insulin. The daughter's knowledge there is been no biopsy of the abdominal wounds given their current appearance and lack of undefined etiology at have to wonder about calciphylaxis. 12/18/15:Addendum; I [facility] I can see no relevant x-rays of the right heel. I note her arteriogram and revascularization of her right lower extremity in April 2017. She had debridement of both abdominal wounds and the right heel and Achilles wound on 11/07/15 at which time she had a right fourth toe ray amputation. The abdominal wounds were debridement again on 6/29. I do not see any relevant pathology of these abdominal wounds 12/24/15; culture I did of the drainage from the midline abdominal wound last week showed both Proteus and ampicillin sensitive enterococcus. I've given her a course of Augmentin adjusted on dialysis days. She has no specific complaints today. Been using Santyl to the abdominal wounds in the right leg wound and the wound VAC on the right Achilles which was initially prescribed by Dr. dew 12/31/15; I have done two punch biopsies of the large midline abdominal. 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 May have been a trauma component of the one on the left lower quadrant however the midline wound had no such history. She has a large area on the right Achilles heel with a wound VAC prescribed by Dr. dew. A small wound on the right anterior leg.Marland Kitchen UNFORTUNATELY she has 2 new wounds today. One on the left heel which is probably a pressure area. As well her previous amputation site of her right fourth toe has dehisced and now has a small wound with significant depth at the  amputation site. [name], [name] ([id number]) 01/14/2016 -- she returns after 2 weeks and had had a punch biopsy of abdominal wound done the last visit -- had a biopsy of her midline abdominal wound done and the Pathology diagnosis is that of ulceration, necrosis and inflammation and negative for dysplasia and malignancy. 01/21/16. 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 In the meantime she has new wounds of the left heel the right fourth toe amputation site is opened up. The back is stopped to the right heel area. 01/28/16; the abdominal wounds continued to improve. The extensive wound on her right Achilles also looks stable except for the lower aspect of the wound where there is a large liquefied area that probes right down to her calcaneus. This cultured Proteus last week I have her on Augmentin and doxycycline 1. I think this is going to need a course of IV antibiotics and I will  call dialysis. X-ray I did last week was negative, I think she is going to need an MRI 02/04/16; right heel MRI as before Saturday. Receiving I believe IV Rocephin at dialysis 02/11/16; as it turns out the patient could not have a MRI as she has a bladder stimulator in place even though it is not currently in use since the beginning of this year. Although she has an allergy to IV contrast she apparently has done well with premedication so we will have to go for a CT scan with contrast. In the meantime she has had a fall now has a large skin tear on her left upper arm. She went to the ER and they suggested Tegaderm over topical antibiotics 03/03/16 currently patient returns after having been hospitalized for 2 weeks and subsequently [facility]. She actually seems to be doing excellent compared to even when we last saw her according to our nursing staff. Both patient and her daughter are extremely encouraged at how well  she is presenting at this point in time. Overall the biggest issue is still the right Achilles area which is being managed at this point in time by Dr. Wyn Quakerew. Patient is currently utilizing a wound VAC to that region. 03/17/16; [facility] is at [facility]. Using Aquacel Ag to the wounds on her bilateral feet and still Prisma to the small open area on her abdomen. 03/31/16 at this point in time patient has been tolerating the dressing changes currently. She fortunately has no worsening of her symptoms although she tells me that the nurse who is caring [facility] decided that nothing was needed in regard to the lower abdominal wound from a dressing standpoint at this time. she is really not having significant discomfort or pain at this point she continues to have some tunneling in the proximal Achilles wound region. 04/07/16 patient continues to do well on evaluation today. Even the Achilles wound which has been the most tender is not giving her as much trouble. Unfortunately the PolyMem dressings that we utilize last week really did not seem to benefit her in particular. Obviously we will discontinue that at this point in time today. 04-15-16:Ms. Noffsinger is accompanied today by her daughter. She is still residing in an SNF undergoing dialysis, continues to receive antibiotics during dialysis as prescribed by Dr. Sampson GoonFitzgerald of infectious disease. She has a follow-up appointment with Dr. Sampson GoonFitzgerald on 04-17-16 to discuss the continuation of these antibiotics. Ms. Abbe AmsterdamHopkins denies any pain to any of the 3 remaining wounds she does admit to changing to [employer] surgical shoes for safety while ambulating with physical therapy. She denies any falls since her last appointment here although she and her daughter do admit to increased tremors of unclear etiology since her last appointment. She has tolerated the dressing changes that were prescribed last week. 04-22-16 Ms.  Sandt presents today accompanied by her daughter for evaluation of her diabetic foot ulcers. She is still residing in an SNF, she continues dialysis 3 times weekly. She'll follow up with Dr. Sampson GoonFitzgerald last Friday, and at that appointment IV antibiotics were discontinued. According to Ms. Bialy and her daughter if there is any regression of her wounds Dr. Sampson GoonFitzgerald will consider re-starting the antibiotics. Ms. Abbe AmsterdamHopkins daughter states that since the discontinuation of antibiotic therapy her "twitches" have resolved. 04/29/16; I have not seen this patient in quite some time however she is completed triple antibiotic therapy Natalie RimaHOPKINS, Natalie Y. ([id number]) given at  dialysis for osteomyelitis as prescribed by infectious disease. She is still being followed by Dr. dew of vascular surgery. We are using Hydrofera Blue to the surface of these wounds. She currently has 2 open areas a substantial area over her Achilles area although this is a lot better than the last time I saw this. She also has a small wound superiorly from this wound which has a superior probing depth of 2 cm. Apparently the measurement of this depth as vacillated quite of bit from appointment to appointment making it difficult to know if we are improving at all Objective Constitutional Sitting or standing Blood Pressure is within target range for patient.. Pulse regular and within target range for patient.Marland Kitchen. Respirations regular, non-labored and within target range.. Temperature is normal and within the target range for the patient.. Vitals Time Taken: 8:14 AM, Height: 65 in, Weight: 160 lbs, BMI: 26.6, Temperature: 96.8 F, Pulse: 80 bpm, Respiratory Rate: 16 breaths/min, Blood Pressure: 120/58 mmHg. Eyes Conjunctivae clear. No discharge.Marland Kitchen. Respiratory Respiratory effort is easy and symmetric bilaterally. Rate is normal at rest and on room air.. Gastrointestinal (GI) No masses. No liver or spleen enlargement or  tenderness.Marland Kitchen. Psychiatric No evidence of depression, anxiety, or agitation. Calm, cooperative, and communicative. Appropriate interactions and affect.. General Notes: Wound exam; the area in question is on her Achilles. She has a large superficial wound with a healthy appearing granular base however she does have rolled wound edges. A small probing area superiorly of 2 cm. There is no drainage here no tenderness and no evidence of infection around either area Integumentary (Hair, Skin) calciphylaxis wounds on abdoman have healed over. These were initially substantial.. Wound #14 status is Open. Original cause of wound was Surgical Injury. The wound is located on the Right Amputation Site - Digit. The wound measures 0cm length x 0cm width x 0cm depth; 0cm^2 area and 0cm^3 volume. The wound is limited to skin breakdown. There is a none present amount of drainage noted. The wound margin is flat and intact. There is no granulation within the wound bed. There is no necrotic tissue within the wound bed. The periwound skin appearance exhibited: Dry/Scaly. The periwound skin appearance did not exhibit: Callus, Crepitus, Excoriation, Fluctuance, Friable, Induration, Localized Edema, Natalie RimaHOPKINS, Amonda Y. ([id number]) Rash, Scarring, Maceration, Moist, Atrophie Blanche, Cyanosis, Ecchymosis, Hemosiderin Staining, Mottled, Pallor, Rubor, Erythema. Periwound temperature was noted as No Abnormality. Wound #17 status is Open. Original cause of wound was Gradually Appeared. The wound is located on the Right,Proximal Achilles. The wound measures 0.5cm length x 0.4cm width x 0.5cm depth; 0.157cm^2 area and 0.079cm^3 volume. The wound is limited to skin breakdown. There is tunneling at 12:00 with a maximum distance of 2.2cm. There is a large amount of serosanguineous drainage noted. The wound margin is distinct with the outline attached to the wound base. There is no granulation within the wound bed. There is a large  (67-100%) amount of necrotic tissue within the wound bed including Adherent Slough. Periwound temperature was noted as No Abnormality. The periwound has tenderness on palpation. Wound #9 status is Open. Original cause of wound was Gradually Appeared. The wound is located on the Right,Distal Achilles. The wound measures 5.5cm length x 2cm width x 0.2cm depth; 8.639cm^2 area and 1.728cm^3 volume. Assessment Active Problems ICD-10 M86.671 - Other chronic osteomyelitis, right ankle and foot E11.621 - Type 2 diabetes mellitus with foot ulcer L97.512 - Non-pressure chronic ulcer of other part of right foot with fat layer exposed E11.51 - Type  2 diabetes mellitus with diabetic peripheral angiopathy without gangrene S31.104A - Unspecified open wound of abdominal wall, left lower quadrant without penetration into peritoneal cavity, initial encounter S31.103D - Unspecified open wound of abdominal wall, right lower quadrant without penetration into peritoneal cavity, subsequent encounter Plan Wound Cleansing: Wound #17 Right,Proximal Achilles: Clean wound with Normal Saline. Wound #9 Right,Distal Achilles: Clean wound with Normal Saline. Anesthetic: Wound #9 Right,Distal Achilles: Topical Lidocaine 4% cream applied to wound bed prior to debridement - all wounds for clinic use only Primary Wound Dressing: Wound #17 Right,Proximal Achilles: Iodoform packing Gauze - pack into 12 o'clock tunnel leave tail out so it can be removed please please [name], [name] ([id number]) pack in at 2 cm; DO NOT OVERPACK! Wound #9 Right,Distal Achilles: Hydrafera Blue - Ready Transfer Secondary Dressing: Wound #9 Right,Distal Achilles: Conform/Kerlix - tape, stretch netting #5 Foam Dressing Change Frequency: Wound #17 Right,Proximal Achilles: Change dressing every other day. Wound #9 Right,Distal Achilles: Change dressing every other day. Follow-up Appointments: Wound #17 Right,Proximal Achilles: Return  Appointment in 1 week. Wound #9 Right,Distal Achilles: Return Appointment in 1 week. Off-Loading: Wound #9 Right,Distal Achilles: Other: - Float heels SNF- Please provide a heel offloading boot for HS. no change to the current dressing orders as noted above, Hydrofera to the large wound and iodoform packing to the open area superiorly She had Appligraf approved some weeks ago, this may be an option if the areas continue to stall anterior abdomain wound has 2 very small open areas. foam based dressing to this area. Electronic Signature(s) Signed: 04/29/2016 4:55:30 PM By: Baltazar Najjar MD Entered By: Baltazar Najjar on 04/29/2016 12:51:18 [name] ([id number]) -------------------------------------------------------------------------------- SuperBill Details Patient Name: [name] Date of Service: 04/29/2016 Medical Record Patient Account Number: [id number] Number: Treating RN: [name] [birth year] (82 [name] Other Clinician: Date of Birth/Sex: Female) Treating [name] Primary Care Physician/Extender: [name] Physician: Weeks in Treatment: 19 Referring Physician: [name] Diagnosis Coding ICD-10 Codes Code Description [phone] Other chronic osteomyelitis, right ankle and foot E11.621 Type 2 diabetes mellitus with foot ulcer L97.512 Non-pressure chronic ulcer of other part of right foot with fat layer exposed E11.51 Type 2 diabetes mellitus with diabetic peripheral angiopathy without gangrene Unspecified open wound of abdominal wall, left lower quadrant without penetration into S31.104A peritoneal cavity, initial encounter Unspecified open wound of abdominal wall, right lower quadrant without penetration into S31.103D peritoneal cavity, subsequent encounter Facility Procedures CPT4 Code: [id number] Description: 99213 - WOUND CARE VISIT-LEV 3 EST PT Modifier: Quantity: 1 Physician Procedures CPT4 Code Description: [id number]  99213 - WC PHYS LEVEL 3 - EST PT ICD-10 Description Diagnosis M86.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

## 2016-05-04 ENCOUNTER — Other Ambulatory Visit: Payer: Medicare Other

## 2016-05-06 ENCOUNTER — Encounter: Payer: No Typology Code available for payment source | Attending: Internal Medicine | Admitting: Internal Medicine

## 2016-05-06 DIAGNOSIS — I482 Chronic atrial fibrillation: Secondary | ICD-10-CM | POA: Insufficient documentation

## 2016-05-06 DIAGNOSIS — X58XXXD Exposure to other specified factors, subsequent encounter: Secondary | ICD-10-CM | POA: Insufficient documentation

## 2016-05-06 DIAGNOSIS — S31103D Unspecified open wound of abdominal wall, right lower quadrant without penetration into peritoneal cavity, subsequent encounter: Secondary | ICD-10-CM | POA: Insufficient documentation

## 2016-05-06 DIAGNOSIS — N186 End stage renal disease: Secondary | ICD-10-CM | POA: Diagnosis not present

## 2016-05-06 DIAGNOSIS — L97512 Non-pressure chronic ulcer of other part of right foot with fat layer exposed: Secondary | ICD-10-CM | POA: Insufficient documentation

## 2016-05-06 DIAGNOSIS — Z794 Long term (current) use of insulin: Secondary | ICD-10-CM | POA: Diagnosis not present

## 2016-05-06 DIAGNOSIS — E1122 Type 2 diabetes mellitus with diabetic chronic kidney disease: Secondary | ICD-10-CM | POA: Diagnosis not present

## 2016-05-06 DIAGNOSIS — E11621 Type 2 diabetes mellitus with foot ulcer: Secondary | ICD-10-CM | POA: Insufficient documentation

## 2016-05-06 DIAGNOSIS — I132 Hypertensive heart and chronic kidney disease with heart failure and with stage 5 chronic kidney disease, or end stage renal disease: Secondary | ICD-10-CM | POA: Insufficient documentation

## 2016-05-06 DIAGNOSIS — Z992 Dependence on renal dialysis: Secondary | ICD-10-CM | POA: Insufficient documentation

## 2016-05-06 DIAGNOSIS — S31104D Unspecified open wound of abdominal wall, left lower quadrant without penetration into peritoneal cavity, subsequent encounter: Secondary | ICD-10-CM | POA: Insufficient documentation

## 2016-05-06 DIAGNOSIS — E1151 Type 2 diabetes mellitus with diabetic peripheral angiopathy without gangrene: Secondary | ICD-10-CM | POA: Insufficient documentation

## 2016-05-06 DIAGNOSIS — I5033 Acute on chronic diastolic (congestive) heart failure: Secondary | ICD-10-CM | POA: Diagnosis not present

## 2016-05-06 DIAGNOSIS — Z8673 Personal history of transient ischemic attack (TIA), and cerebral infarction without residual deficits: Secondary | ICD-10-CM | POA: Insufficient documentation

## 2016-05-07 ENCOUNTER — Encounter
Admission: RE | Admit: 2016-05-07 | Discharge: 2016-05-07 | Disposition: A | Discharge status: Home / Self Care | Payer: No Typology Code available for payment source | Source: Ambulatory Visit | Attending: Vascular Surgery | Admitting: Vascular Surgery

## 2016-05-07 DIAGNOSIS — Z01812 Encounter for preprocedural laboratory examination: Secondary | ICD-10-CM | POA: Insufficient documentation

## 2016-05-07 DIAGNOSIS — Z01810 Encounter for preprocedural cardiovascular examination: Secondary | ICD-10-CM | POA: Insufficient documentation

## 2016-05-07 DIAGNOSIS — N186 End stage renal disease: Secondary | ICD-10-CM | POA: Diagnosis not present

## 2016-05-07 HISTORY — DX: Anxiety disorder, unspecified: F41.9

## 2016-05-07 HISTORY — DX: Type 2 diabetes mellitus with diabetic neuropathy, unspecified: E11.40

## 2016-05-07 LAB — CBC WITH DIFFERENTIAL/PLATELET
BASOS ABS: 0.1 10*3/uL (ref 0–0.1)
BASOS PCT: 1 %
EOS ABS: 0.2 10*3/uL (ref 0–0.7)
EOS PCT: 2 %
HCT: 35.8 % (ref 35.0–47.0)
Hemoglobin: 12 g/dL (ref 12.0–16.0)
Lymphocytes Relative: 7 %
Lymphs Abs: 0.7 10*3/uL — ABNORMAL LOW (ref 1.0–3.6)
MCH: 31.1 pg (ref 26.0–34.0)
MCHC: 33.6 g/dL (ref 32.0–36.0)
MCV: 92.7 fL (ref 80.0–100.0)
MONO ABS: 0.8 10*3/uL (ref 0.2–0.9)
Monocytes Relative: 9 %
Neutro Abs: 7.9 10*3/uL — ABNORMAL HIGH (ref 1.4–6.5)
Neutrophils Relative %: 81 %
PLATELETS: 209 10*3/uL (ref 150–440)
RBC: 3.87 MIL/uL (ref 3.80–5.20)
RDW: 17.8 % — AB (ref 11.5–14.5)
WBC: 9.6 10*3/uL (ref 3.6–11.0)

## 2016-05-07 LAB — BASIC METABOLIC PANEL
ANION GAP: 15 (ref 5–15)
BUN: 53 mg/dL — AB (ref 6–20)
CALCIUM: 8.5 mg/dL — AB (ref 8.9–10.3)
CO2: 24 mmol/L (ref 22–32)
Chloride: 97 mmol/L — ABNORMAL LOW (ref 101–111)
Creatinine, Ser: 4.69 mg/dL — ABNORMAL HIGH (ref 0.44–1.00)
GFR calc Af Amer: 9 mL/min — ABNORMAL LOW (ref >60)
GFR, EST NON AFRICAN AMERICAN: 8 mL/min — AB (ref >60)
GLUCOSE: 227 mg/dL — AB (ref 65–99)
Potassium: 4.1 mmol/L (ref 3.5–5.1)
Sodium: 136 mmol/L (ref 135–145)

## 2016-05-07 LAB — SURGICAL PCR SCREEN
MRSA, PCR: NEGATIVE
Staphylococcus aureus: NEGATIVE

## 2016-05-07 LAB — PROTIME-INR
INR: 2.02
Prothrombin Time: 23.2 seconds — ABNORMAL HIGH (ref 11.4–15.2)

## 2016-05-07 LAB — APTT: APTT: 49 s — AB (ref 24–36)

## 2016-05-07 NOTE — Progress Notes (Signed)
[name], Natalie Y. ([id number]) Visit Report for 05/06/2016 Chief Complaint Document Details Patient Name: [name], Natalie Y. Date of Service: 05/06/2016 8:45 AM Medical Record Patient Account Number: [id number] Number: Treating RN: Phillis Haggisinkerton, Debi 22-Feb-1934 (82 [name] Other Clinician: Date of Birth/Sex: Female) Treating ROBSON, MICHAEL Primary Care Physician/Extender: Rex KrasG Grandis, Heidi Physician: Referring Physician: Charolotte CapuchinGrandis, Heidi Weeks in Treatment: 20 Information Obtained from: Patient Chief Complaint Ms. Huttner presents today for follow-up evaluation of her diabetic foot ulcers and abdominal wound. Electronic Signature(s) Signed: 05/06/2016 4:39:43 PM By: Baltazar Najjarobson, Michael MD Entered By: Baltazar Najjarobson, Michael on 05/06/2016 11:30:20 [name], Natalie Y. ([id number]) -------------------------------------------------------------------------------- Debridement Details Patient Name: [name], Natalie Y. Date of Service: 05/06/2016 8:45 AM Medical Record Patient Account Number: [id number] Number: Treating RN: Phillis Haggisinkerton, Debi 22-Feb-1934 (82 [name] Other Clinician: Date of Birth/Sex: Female) Treating ROBSON, MICHAEL Primary Care Physician/Extender: Rex KrasG Grandis, Heidi Physician: Referring Physician: Charolotte CapuchinGrandis, Heidi Weeks in Treatment: 20 Debridement Performed for Wound #14 Right Amputation Site - Digit Assessment: Performed By: Physician Maxwell CaulOBSON, MICHAEL G, MD Debridement: Debridement Pre-procedure Yes - 09:30 Verification/Time Out Taken: Start Time: 09:34 Pain Control: Lidocaine 4% Topical Solution Level: Skin/Subcutaneous Tissue Total Area Debrided (L x 0.3 (cm) x 0.3 (cm) = 0.09 (cm) W): Tissue and other Viable, Non-Viable, Exudate, Fibrin/Slough, Subcutaneous material debrided: Instrument: Curette Bleeding: Minimum Hemostasis Achieved: Pressure End Time: 09:35 Procedural Pain: 0 Post Procedural Pain: 0 Response to Treatment: Procedure was tolerated well Post  Debridement Measurements of Total Wound Length: (cm) 0.5 Width: (cm) 0.5 Depth: (cm) 0.3 Volume: (cm) 0.059 Character of Wound/Ulcer Post Requires Further Debridement Debridement: Severity of Tissue Post Debridement: Fat layer exposed Post Procedure Diagnosis Same as Pre-procedure Electronic Signature(s) Signed: 05/06/2016 4:39:43 PM By: Baltazar Najjarobson, Michael MD [name], Natalie Y. ([id number]) Signed: 05/06/2016 4:44:01 PM By: Alejandro MullingPinkerton, Debra Entered By: Baltazar Najjarobson, Michael on 05/06/2016 11:29:44 [name], Graviela Y. ([id number]) -------------------------------------------------------------------------------- Debridement Details Patient Name: [name], Everleigh Y. Date of Service: 05/06/2016 8:45 AM Medical Record Patient Account Number: [id number] Number: Treating RN: Phillis Haggisinkerton, Debi 22-Feb-1934 (82 [name] Other Clinician: Date of Birth/Sex: Female) Treating ROBSON, MICHAEL Primary Care Physician/Extender: Rex KrasG Grandis, Heidi Physician: Referring Physician: Charolotte CapuchinGrandis, Heidi Weeks in Treatment: 20 Debridement Performed for Wound #9 Right,Distal Achilles Assessment: Performed By: Physician Maxwell CaulOBSON, MICHAEL G, MD Debridement: Debridement Pre-procedure Yes - 09:30 Verification/Time Out Taken: Start Time: 09:31 Pain Control: Lidocaine 4% Topical Solution Level: Skin/Subcutaneous Tissue Total Area Debrided (L x 5.5 (cm) x 2 (cm) = 11 (cm) W): Tissue and other Viable, Non-Viable, Exudate, Fibrin/Slough, Subcutaneous material debrided: Instrument: Curette Bleeding: Minimum Hemostasis Achieved: Pressure End Time: 09:33 Procedural Pain: 0 Post Procedural Pain: 0 Response to Treatment: Procedure was tolerated well Post Debridement Measurements of Total Wound Length: (cm) 5.5 Width: (cm) 2 Depth: (cm) 0.2 Volume: (cm) 1.728 Character of Wound/Ulcer Post Requires Further Debridement Debridement: Severity of Tissue Post Debridement: Fat layer exposed Post Procedure Diagnosis Same as  Pre-procedure Electronic Signature(s) Signed: 05/06/2016 4:39:43 PM By: Baltazar Najjarobson, Michael MD [name], Natalie Y. ([id number]) Signed: 05/06/2016 4:44:01 PM By: Alejandro MullingPinkerton, Debra Entered By: Baltazar Najjarobson, Michael on 05/06/2016 11:29:57 [name], Marylyn Y. ([id number]) -------------------------------------------------------------------------------- HPI Details Patient Name: [name], Natalie Y. Date of Service: 05/06/2016 8:45 AM Medical Record Patient Account Number: [id number] Number: Treating RN: Phillis Haggisinkerton, Debi 22-Feb-1934 (82 [name] Other Clinician: Date of Birth/Sex: Female) Treating ROBSON, MICHAEL Primary Care Physician/Extender: Rex KrasG Grandis, Heidi Physician: Referring Physician: Charolotte CapuchinGrandis, Heidi Weeks in Treatment: 20 History of Present Illness Location: right posterior heel, right Achilles, right lower quadrant abdomen, right fourth toe amp site Quality:  denies pain to any wound Severity: not applicable Timing: denies pain HPI Description: 82 year old patient who is known to be diabetic, was referred to [place] by Dr. Gavin Potters for a right heel ulceration which she's had for a while. She was recently in hospital for a pneumonia and at that time and got delirious and was disoriented and sometime during this time developed a stage II ulcer on her right heel. Her past medical history is significant for bilateral pneumonia which was treated with injectable antibiotics and then to oral Levaquin which he has completed. She also has acute on chronic diastolic CHF, acute on chronic respiratory failure, end-stage renal disease on hemodialysis, atrial fibrillation, recent stroke, diabetes mellitus. The patient and her son are poor historians but from what I understand she was admitted to the hospital with an acute vascular compromise of her right lower extremity and Dr. Wyn Quaker has done a surgical procedure and we are trying to obtain these notes. There are also some vascular workup done and we will try  and obtain these notes. the injury to the left lower quadrant of abdomen and the suprapubic area have been there due to a bruise and have been there for several months and no intervention has been done. 10/11/2015 -- on review of the electronics records it was noted that the patient was admitted to the hospital on 09/14/2015 with peripheral vascular disease with claudication, end-stage renal disease, pressure ulcer, chronic atrial fibrillation. She was seen by Dr. Wyn Quaker who did her right lower extremity angiogram , angioplasty of the right anterior tibial artery and thrombolysis with TPA of the right popliteal artery, and thrombectomy. She was seen by Dr. Wyn Quaker during this past week and he was pleased with the progress. He did say that if he took her to the operating room for any procedure he would debride the abdominal wound under anesthesia. She was also seen by Dr. Ether Griffins the podiatrist who thought that she may lose her right fourth toe at some stage may need an amputation of this. 10/21/2015 --patient known to Dr. Wyn Quaker and his last office visit from 10/04/2015 has been reviewed. 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 She also had a persistent ulceration of the right heel and markedly this area and a right third and fourth toe and a small scab on the calf but these are dry and seemed to be improving. Patient also has a left carotid endarterectomy and multiple interventions to a right brachiocephalic AV fistula. After the visit he had recommended noninvasive studies to recheck her revascularization. GAYLIA, KASSEL ([id number]) He was off the impression that she would likely lose the right fourth toe and the third toe was likely to heal. He was concerned about underlying muscle necrosis on her right heel and midfoot. 11/01/2015 -- an echo done in January of this year showed  her left ventricular ejection fraction to be about 50-55%. The patient was seen by the PA and Dr. Driscilla Grammes office and the plan was to take her to the operating room soon to have a debridement under anesthesia for the abdominal wall wound, the Achilles tendon on the right leg and amputation of the right fourth toe. The daughter and the patient do not feel that they would be able to undergo hyperbaric oxygen therapy 5 days a week for 6 weeks. 12/17/15; this is a medically complex woman who I note was recently in this  clinic however I was not involved with her care. She returns today with multiple wounds; a) she has a wound in the mid abdomen that is been there since March of this year. I note that she is been to the overall for debridement recently. The exact etiology of this wound is not really clear b) left lower quadrant abdominal wound had some sanguinous drainage when she came in here. The patient fell in January and thinks this may have been secondary to a hematoma. c): The patient has 3 wounds on her right leg including a small wound on the right mid calf, a large area over the Achilles which currently has a wound VAC for the last 6 weeks, also a smaller wound on the distal part of the right heel. As far as I understand most of these wounds are currently been dressed with's calcium alginate. According to her daughter the Achilles wound under the wound VAC is doing well d) the patient is had an amputation of her left fifth toe in January and the right fourth toe 6 weeks ago secondary to diabetic PAD e) the patient has chronic renal failure on dialysis for the last 2 years secondary to type 2 diabetes on insulin. The daughter's knowledge there is been no biopsy of the abdominal wounds given their current appearance and lack of undefined etiology at have to wonder about calciphylaxis. 12/18/15:Addendum; I [facility] I can see no relevant x-rays of the right heel. I note her  arteriogram and revascularization of her right lower extremity in April 2017. She had debridement of both abdominal wounds and the right heel and Achilles wound on 11/07/15 at which time she had a right fourth toe ray amputation. The abdominal wounds were debridement again on 6/29. I do not see any relevant pathology of these abdominal wounds 12/24/15; culture I did of the drainage from the midline abdominal wound last week showed both Proteus and ampicillin sensitive enterococcus. I've given her a course of Augmentin adjusted on dialysis days. She has no specific complaints today. Been using Santyl to the abdominal wounds in the right leg wound and the wound VAC on the right Achilles which was initially prescribed by Dr. dew 12/31/15; I have done two punch biopsies of the large midline abdominal. 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 May have been a trauma component of the one on the left lower quadrant however the midline wound had no such history. She has a large area on the right Achilles heel with a wound VAC prescribed by Dr. dew. A small wound on the right anterior leg.Marland Kitchen UNFORTUNATELY she has 2 new wounds today. One on the left heel which is probably a pressure area. As well her previous amputation site of her right fourth toe has dehisced and now has a small wound with significant depth at the amputation site. 01/14/2016 -- she returns after 2 weeks and had had a punch biopsy of abdominal wound done the last visit -- had a biopsy of her midline abdominal wound done and the Pathology diagnosis is that of ulceration, necrosis and inflammation and negative for dysplasia and malignancy. 01/21/16. 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 In the meantime she has new wounds of the left heel the right fourth toe amputation site is opened up. The back is stopped to the right heel area. 01/28/16; the abdominal wounds continued to  improve. The extensive wound on her right Achilles also looks stable  except for the lower aspect of the wound where there is a large liquefied area that probes right down [name], Rabecca Y. ([id number]) to her calcaneus. This cultured Proteus last week I have her on Augmentin and doxycycline 1. I think this is going to need a course of IV antibiotics and I will call dialysis. X-ray I did last week was negative, I think she is going to need an MRI 02/04/16; right heel MRI as before Saturday. Receiving I believe IV Rocephin at dialysis 02/11/16; as it turns out the patient could not have a MRI as she has a bladder stimulator in place even though it is not currently in use since the beginning of this year. Although she has an allergy to IV contrast she apparently has done well with premedication so we will have to go for a CT scan with contrast. In the meantime she has had a fall now has a large skin tear on her left upper arm. She went to the ER and they suggested Tegaderm over topical antibiotics 03/03/16 currently patient returns after having been hospitalized for 2 weeks and subsequently [facility] She actually seems to be doing excellent compared to even when we last saw her according to our nursing staff. Both patient and her daughter are extremely encouraged at how well she is presenting at this point in time. Overall the biggest issue is still the right Achilles area which is being managed at this point in time by Dr. Wyn Quakerew. Patient is currently utilizing a wound VAC to that region. 03/17/16; [facility]. Using Aquacel Ag to the wounds on her bilateral feet and still Prisma to the small open area on her abdomen. 03/31/16 at this point in time patient has been tolerating the dressing changes currently. She fortunately has no worsening of her symptoms although she tells me that the nurse who [facility]  decided that nothing was needed in regard to the lower abdominal wound from a dressing standpoint at this time. she is really not having significant discomfort or pain at this point she continues to have some tunneling in the proximal Achilles wound region. 04/07/16 patient continues to do well on evaluation today. Even the Achilles wound which has been the most tender is not giving her as much trouble. Unfortunately the PolyMem dressings that we utilize last week really did not seem to benefit her in particular. Obviously we will discontinue that at this point in time today. 04-15-16:Ms. Hoefer is accompanied today by her daughter. She is still residing in an SNF undergoing dialysis, continues to receive antibiotics during dialysis as prescribed by Dr. Sampson GoonFitzgerald of infectious disease. She has a follow-up appointment with Dr. Sampson GoonFitzgerald on 04-17-16 to discuss the continuation of these antibiotics. Ms. Abbe AmsterdamHopkins denies any pain to any of the 3 remaining wounds she does admit to changing to [employer] surgical shoes for safety while ambulating with physical therapy. She denies any falls since her last appointment here although she and her daughter do admit to increased tremors of unclear etiology since her last appointment. She has tolerated the dressing changes that were prescribed last week. 04-22-16 Ms. Bruni presents today accompanied by her daughter for evaluation of her diabetic foot ulcers. She is still residing in an SNF, she continues dialysis 3 times weekly. She'll follow up with Dr. Sampson GoonFitzgerald last Friday, and at that appointment IV antibiotics were discontinued. According to Ms. Leclaire and her daughter if  there is any regression of her wounds Dr. Sampson Goon will consider re-starting the antibiotics. Ms. Cork daughter states that since the discontinuation of antibiotic therapy her "twitches" have resolved. 04/29/16; I have not seen this patient in quite some time however she is completed  triple antibiotic therapy given at dialysis for osteomyelitis as prescribed by infectious disease. She is still being followed by Dr. dew of vascular surgery. We are using Hydrofera Blue to the surface of these wounds. She currently has 2 open areas a substantial area over her Achilles area although this is a lot better than the last time I saw this. She also has a small wound superiorly from this wound which has a superior probing depth of 2 cm. 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 My biopsy did not prove this nevertheless I think this is the correct clinical diagnosis. We are KEIVA, DINA ([id number]) now following her for an area on the right Achilles part of her heel. This is not any different from last week. She also has a small tunneling area just above this. And unfortunately this week there is been a reopening of an area where her right fourth toe was previously amputated. She is completed antibiotics Electronic Signature(s) Signed: 05/06/2016 4:39:43 PM By: Baltazar Najjar MD Entered By: Baltazar Najjar on 05/06/2016 11:32:32 [name] ([id number]) -------------------------------------------------------------------------------- Physical Exam Details Patient Name: [name] Date of Service: 05/06/2016 8:45 AM Medical Record Patient Account Number: [id number] Number: Treating RN: Phillis Haggis Mar 02, 1934 (82 [name] Other Clinician: Date of Birth/Sex: Female) Treating ROBSON, MICHAEL Primary Care Physician/Extender: Rex Kras, Heidi Physician: Referring Physician: Charolotte Capuchin in Treatment: 20 Constitutional Sitting or standing Blood Pressure is within target range for patient.. Pulse regular and within target  range for patient.Marland Kitchen Respirations regular, non-labored and within target range.. Temperature is normal and within the target range for the patient.. Patient's appearance is neat and clean. Appears in no acute distress. Well nourished and well developed.. Notes Wound exam; not too much different from last week over the Achilles and the small wound above it. The major Achilles wound is covered in adherent surface slough. This required to debridement. The amputation site area on the fourth toe also has reopened, last week covered with a eschar. Electronic Signature(s) Signed: 05/06/2016 4:39:43 PM By: Baltazar Najjar MD Entered By: Baltazar Najjar on 05/06/2016 11:35:12 [name] ([id number]) -------------------------------------------------------------------------------- Physician Orders Details Patient Name: LAHOMA, CONSTANTIN. Date of Service: 05/06/2016 8:45 AM Medical Record Patient Account Number: [id number] Number: Treating RN: Phillis Haggis 01/01/1934 (82 [name] Other Clinician: Date of Birth/Sex: Female) Treating ROBSON, MICHAEL Primary Care Physician/Extender: Rex Kras, Heidi Physician: Referring Physician: Charolotte Capuchin in Treatment: 20 Verbal / Phone Orders: Yes Clinician: Ashok Cordia, Debi Read Back and Verified: Yes Diagnosis Coding Wound Cleansing Wound #14 Right Amputation Site - Digit o Clean wound with Normal Saline. Wound #17 Right,Proximal Achilles o Clean wound with Normal Saline. Wound #9 Right,Distal Achilles o Clean wound with Normal Saline. Anesthetic Wound #14 Right Amputation Site - Digit o Topical Lidocaine 4% cream applied to wound bed prior to debridement - all wounds for clinic use only Wound #17 Right,Proximal Achilles o Topical Lidocaine 4% cream applied to wound bed prior to debridement - all wounds for clinic use only Wound #  9 Right,Distal Achilles o Topical Lidocaine 4% cream applied to wound bed prior to  debridement - all wounds for clinic use only Skin Barriers/Peri-Wound Care Wound #14 Right Amputation Site - Digit o Skin Prep Primary Wound Dressing Wound #14 Right Amputation Site - Digit o Hydrafera Blue - Hydrafera Blue Ready Transfer pack light into wound with enough out so you can gently pull it out Wound #17 Right,Proximal Achilles HAYAT, [place] ([id number]) o Hydrafera Blue - Hydrafera Blue Ready Transfer pack light into wound with enough out so you can gently pull it out Wound #9 Right,Distal Achilles o Hydrafera Blue - Hydrafera Blue Ready Transfer Secondary Dressing Wound #14 Right Amputation Site - Digit o Dry Gauze o Other - band-aide Wound #9 Right,Distal Achilles o Conform/Kerlix - tape, stretch netting #5 o Foam Dressing Change Frequency Wound #14 Right Amputation Site - Digit o Change dressing every other day. Wound #17 Right,Proximal Achilles o Change dressing every other day. Wound #9 Right,Distal Achilles o Change dressing every other day. Follow-up Appointments Wound #14 Right Amputation Site - Digit o Return Appointment in 1 week. Wound #17 Right,Proximal Achilles o Return Appointment in 1 week. Wound #9 Right,Distal Achilles o Return Appointment in 1 week. Edema Control Wound #14 Right Amputation Site - Digit o Elevate legs to the level of the heart and pump ankles as often as possible Wound #17 Right,Proximal Achilles o Elevate legs to the level of the heart and pump ankles as often as possible Wound #9 Right,Distal Achilles o Elevate legs to the level of the heart and pump ankles as often as possible Off-Loading Wound #14 Right Amputation Site - Digit LONA, SIX ([id number]) o Other: - [facility] SNF- Please provide a heel offloading boot for HS. Wound #9 Right,Distal Achilles o Other: - [facility] SNF- Please provide a heel offloading boot for HS. Notes Order Apligraph for next  week. Electronic Signature(s) Signed: 05/06/2016 4:39:43 PM By: Baltazar Najjar MD Signed: 05/06/2016 4:44:01 PM By: Alejandro Mulling Entered By: Alejandro Mulling on 05/06/2016 10:20:57 [name] ([id number]) -------------------------------------------------------------------------------- Problem List Details Patient Name: LAVONN, MAXCY. Date of Service: 05/06/2016 8:45 AM Medical Record Patient Account Number: [id number] Number: Treating RN: Phillis Haggis 1934-06-01 (82 [name] Other Clinician: Date of Birth/Sex: Female) Treating ROBSON, MICHAEL Primary Care Physician/Extender: Rex Kras, Heidi Physician: Referring Physician: Charolotte Capuchin in Treatment: 20 Active Problems ICD-10 Encounter Code Description Active Date Diagnosis E11.621 Type 2 diabetes mellitus with foot ulcer 12/17/2015 Yes L97.512 Non-pressure chronic ulcer of other part of right foot with 03/31/2016 Yes fat layer exposed E11.51 Type 2 diabetes mellitus with diabetic peripheral 12/17/2015 Yes angiopathy without gangrene S31.104A Unspecified open wound of abdominal wall, left lower 12/17/2015 Yes quadrant without penetration into peritoneal cavity, initial encounter S31.103D Unspecified open wound of abdominal wall, right lower 04/15/2016 Yes quadrant without penetration into peritoneal cavity, subsequent encounter Inactive Problems ICD-10 Code Description Active Date Inactive Date M86.671 Other chronic osteomyelitis, right ankle and foot 04/15/2016 04/15/2016 Resolved Problems [name], [name] ([id number]) Electronic Signature(s) Signed: 05/06/2016 4:39:43 PM By: Baltazar Najjar MD Entered By: Baltazar Najjar on 05/06/2016 11:26:11 [name] ([id number]) -------------------------------------------------------------------------------- Progress Note Details Patient Name: [name] Date of Service: 05/06/2016 8:45 AM Medical Record Patient Account Number:  [id number] Number: Treating RN: Phillis Haggis 1933-09-01 (82 [name] Other Clinician: Date of Birth/Sex: Female) Treating ROBSON, MICHAEL Primary Care Physician/Extender: Rex Kras, Heidi Physician: Referring Physician: Charolotte Capuchin in Treatment: 20 Subjective Chief Complaint Information obtained  from Patient Ms. Penza presents today for follow-up evaluation of her diabetic foot ulcers and abdominal wound. History of Present Illness (HPI) The following HPI elements were documented for the patient's wound: Location: right posterior heel, right Achilles, right lower quadrant abdomen, right fourth toe amp site Quality: denies pain to any wound Severity: not applicable Timing: denies pain 82 year old patient who is known to be diabetic, was referred to [place] by Dr. Gavin Potters for a right heel ulceration which she's had for a while. She was recently in hospital for a pneumonia and at that time and got delirious and was disoriented and sometime during this time developed a stage II ulcer on her right heel. Her past medical history is significant for bilateral pneumonia which was treated with injectable antibiotics and then to oral Levaquin which he has completed. She also has acute on chronic diastolic CHF, acute on chronic respiratory failure, end-stage renal disease on hemodialysis, atrial fibrillation, recent stroke, diabetes mellitus. The patient and her son are poor historians but from what I understand she was admitted to the hospital with an acute vascular compromise of her right lower extremity and Dr. Wyn Quaker has done a surgical procedure and we are trying to obtain these notes. There are also some vascular workup done and we will try and obtain these notes. the injury to the left lower quadrant of abdomen and the suprapubic area have been there due to a bruise and have been there for several months and no intervention has been done. 10/11/2015 -- on review of the  electronics records it was noted that the patient was admitted to the hospital on 09/14/2015 with peripheral vascular disease with claudication, end-stage renal disease, pressure ulcer, chronic atrial fibrillation. She was seen by Dr. Wyn Quaker who did her right lower extremity angiogram , angioplasty of the right anterior tibial artery and thrombolysis with TPA of the right popliteal artery, and thrombectomy. She was seen by Dr. Wyn Quaker during this past week and he was pleased with the progress. He did say that if he took her to the operating room for any procedure he would debride the abdominal wound under anesthesia. She was also seen by Dr. Ether Griffins the podiatrist who thought that she may lose her right fourth toe at some stage may need an amputation of this. ANELIESE, BEAUDRY ([id number]) 10/21/2015 --patient known to Dr. Wyn Quaker and his last office visit from 10/04/2015 has been reviewed. 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 She also had a persistent ulceration of the right heel and markedly this area and a right third and fourth toe and a small scab on the calf but these are dry and seemed to be improving. Patient also has a left carotid endarterectomy and multiple interventions to a right brachiocephalic AV fistula. After the visit he had recommended noninvasive studies to recheck her revascularization. He was off the impression that she would likely lose the right fourth toe and the third toe was likely to heal. He was concerned about underlying muscle necrosis on her right heel and midfoot. 11/01/2015 -- an echo done in January of this year showed her left ventricular ejection fraction to be about 50-55%. The patient was seen by the PA and Dr. Driscilla Grammes office and the plan was to take her to the operating room soon to have a debridement under anesthesia for the abdominal wall  wound, the Achilles tendon on the right leg  and amputation of the right fourth toe. The daughter and the patient do not feel that they would be able to undergo hyperbaric oxygen therapy 5 days a week for 6 weeks. 12/17/15; this is a medically complex woman who I note was recently in this clinic however I was not involved with her care. She returns today with multiple wounds; a) she has a wound in the mid abdomen that is been there since March of this year. I note that she is been to the overall for debridement recently. The exact etiology of this wound is not really clear b) left lower quadrant abdominal wound had some sanguinous drainage when she came in here. The patient fell in January and thinks this may have been secondary to a hematoma. c): The patient has 3 wounds on her right leg including a small wound on the right mid calf, a large area over the Achilles which currently has a wound VAC for the last 6 weeks, also a smaller wound on the distal part of the right heel. As far as I understand most of these wounds are currently been dressed with's calcium alginate. According to her daughter the Achilles wound under the wound VAC is doing well d) the patient is had an amputation of her left fifth toe in January and the right fourth toe 6 weeks ago secondary to diabetic PAD e) the patient has chronic renal failure on dialysis for the last 2 years secondary to type 2 diabetes on insulin. The daughter's knowledge there is been no biopsy of the abdominal wounds given their current appearance and lack of undefined etiology at have to wonder about calciphylaxis. 12/18/15:Addendum; I have reviewed [facility] I can see no relevant x-rays of the right heel. I note her arteriogram and revascularization of her right lower extremity in April 2017. She had debridement of both abdominal wounds and the right heel and Achilles wound on 11/07/15 at which time she had a right fourth toe ray amputation. The  abdominal wounds were debridement again on 6/29. I do not see any relevant pathology of these abdominal wounds 12/24/15; culture I did of the drainage from the midline abdominal wound last week showed both Proteus and ampicillin sensitive enterococcus. I've given her a course of Augmentin adjusted on dialysis days. She has no specific complaints today. Been using Santyl to the abdominal wounds in the right leg wound and the wound VAC on the right Achilles which was initially prescribed by Dr. dew 12/31/15; I have done two punch biopsies of the large midline abdominal. 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 May have been a trauma component of the one on the left lower quadrant however the midline wound had no such history. She has a large area on the right Achilles heel with a wound VAC prescribed by Dr. dew. A small wound on the right anterior leg.Marland Kitchen UNFORTUNATELY she has 2 new wounds today. One on the left heel which is probably a pressure area. As well her previous amputation site of her right fourth toe has dehisced and now has a small wound with significant depth at the amputation site. ELLIZABETH, DACRUZ ([id number]) 01/14/2016 -- she returns after 2 weeks and had had a punch biopsy of abdominal wound done the last visit -- had a biopsy of her midline abdominal wound done and the Pathology diagnosis is that of ulceration, necrosis and inflammation and negative for dysplasia and malignancy. 01/21/16. 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 In the meantime she has new wounds of the left heel the right fourth toe amputation site is opened up. The back is stopped to the right heel area. 01/28/16; the abdominal wounds continued to improve. The extensive wound on her right Achilles also looks stable except for the lower aspect of the wound where there is a large liquefied area that probes right down to her calcaneus. This cultured  Proteus last week I have her on Augmentin and doxycycline 1. I think this is going to need a course of IV antibiotics and I will call dialysis. X-ray I did last week was negative, I think she is going to need an MRI 02/04/16; right heel MRI as before Saturday. Receiving I believe IV Rocephin at dialysis 02/11/16; as it turns out the patient could not have a MRI as she has a bladder stimulator in place even though it is not currently in use since the beginning of this year. Although she has an allergy to IV contrast she apparently has done well with premedication so we will have to go for a CT scan with contrast. In the meantime she has had a fall now has a large skin tear on her left upper arm. She went to the ER and they suggested Tegaderm over topical antibiotics 03/03/16 currently patient returns after having been hospitalized for 2 weeks and subsequently transferred to [facility] nursing facility. She actually seems to be doing excellent compared to even when we last saw her according to our nursing staff. Both patient and her daughter are extremely encouraged at how well she is presenting at this point in time. Overall the biggest issue is still the right Achilles area which is being managed at this point in time by Dr. Wyn Quaker. Patient is currently utilizing a wound VAC to that region. 03/17/16; patient is at [facility] nursing home still. Using Aquacel Ag to the wounds on her bilateral feet and still Prisma to the small open area on her abdomen. 03/31/16 at this point in time patient has been tolerating the dressing changes currently. She fortunately has no worsening of her symptoms although she tells me that the nurse who is caring for her at [facility] nursing facility decided that nothing was needed in regard to the lower abdominal wound from a dressing standpoint at this time. she is really not having significant discomfort or pain at this point she continues to have some tunneling in the proximal  Achilles wound region. 04/07/16 patient continues to do well on evaluation today. Even the Achilles wound which has been the most tender is not giving her as much trouble. Unfortunately the PolyMem dressings that we utilize last week really did not seem to benefit her in particular. Obviously we will discontinue that at this point in time today. 04-15-16:Ms. Berti is accompanied today by her daughter. She is still residing in an SNF undergoing dialysis, continues to receive antibiotics during dialysis as prescribed by Dr. Sampson Goon of infectious disease. She has a follow-up appointment with Dr. Sampson Goon on 04-17-16 to discuss the continuation of these antibiotics. Ms. Stabler denies any pain to any of the 3 remaining wounds she does admit to changing to [employer] surgical shoes for safety while ambulating with physical therapy. She denies any falls since her last appointment here although she and her daughter do admit to increased tremors of unclear etiology since her last appointment. She has tolerated the dressing changes that were prescribed last week. 04-22-16 Ms. Darnold presents today accompanied by  her daughter for evaluation of her diabetic foot ulcers. She is still residing in an SNF, she continues dialysis 3 times weekly. She'll follow up with Dr. Sampson Goon last Friday, and at that appointment IV antibiotics were discontinued. According to Ms. Kneisley and her daughter if there is any regression of her wounds Dr. Sampson Goon will consider re-starting the antibiotics. Ms. Fonseca daughter states that since the discontinuation of antibiotic therapy her "twitches" have resolved. 04/29/16; I have not seen this patient in quite some time however she is completed triple antibiotic therapy FONDA, ROCHON ([id number]) given at dialysis for osteomyelitis as prescribed by infectious disease. She is still being followed by Dr. dew of vascular surgery. We are using Hydrofera Blue to the surface of  these wounds. She currently has 2 open areas a substantial area over her Achilles area although this is a lot better than the last time I saw this. She also has a small wound superiorly from this wound which has a superior probing depth of 2 cm. 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 My biopsy did not prove this nevertheless I think this is the correct clinical diagnosis. We are now following her for an area on the right Achilles part of her heel. This is not any different from last week. She also has a small tunneling area just above this. And unfortunately this week there is been a reopening of an area where her right fourth toe was previously amputated. She is completed antibiotics Objective Constitutional Sitting or standing Blood Pressure is within target range for patient.. Pulse regular and within target range for patient.Marland Kitchen Respirations regular, non-labored and within target range.. Temperature is normal and within the target range for the patient.. Patient's appearance is neat and clean. Appears in no acute distress. Well nourished and well developed.. Vitals Time Taken: 9:02 AM, Height: 65 in, Weight: 160 lbs, BMI: 26.6, Pulse: 65 bpm, Respiratory Rate: 16 breaths/min, Blood Pressure: 130/55 mmHg. General Notes: Wound exam; not too much different from last week over the Achilles and the small wound above it. The major Achilles wound is covered in adherent surface slough. This required to debridement. The amputation site area on the fourth toe also has reopened, last week covered with a eschar. Integumentary (Hair, Skin) Wound #14 status is Open. Original cause of wound was Surgical Injury. The wound is located on the Right Amputation Site - Digit. The wound  measures 0.3cm length x 0.3cm width x 0.3cm depth; 0.071cm^2 area and 0.021cm^3 volume. The wound is limited to skin breakdown. There is no tunneling or undermining noted. There is a large amount of serous drainage noted. The wound margin is flat and intact. There is no granulation within the wound bed. There is a large (67-100%) amount of necrotic tissue within the wound bed including Adherent Slough. The periwound skin appearance exhibited: Dry/Scaly. The periwound skin appearance did not exhibit: Callus, Crepitus, Excoriation, Fluctuance, Friable, Induration, Localized Edema, Rash, Scarring, Maceration, Moist, Atrophie Blanche, Cyanosis, Ecchymosis, Hemosiderin Staining, Mottled, Pallor, Rubor, Erythema. Periwound temperature was noted as No Abnormality. Wound #17 status is Open. Original cause of wound was Gradually Appeared. The wound is located on the Right,Proximal Achilles. The wound measures 1cm length x 0.5cm width x 0.5cm depth; 0.393cm^2 area and 0.196cm^3 volume. The wound is limited to  skin breakdown. There is no undermining noted, however, there is tunneling at 12:00 with a maximum distance of 3.6cm. There is a large amount of serosanguineous drainage noted. The wound margin is distinct with the outline attached to the wound base. There is large EXILDA, WILHITE ([id number]) (67-100%) red granulation within the wound bed. There is a small (1-33%) amount of necrotic tissue within the wound bed including Adherent Slough. Periwound temperature was noted as No Abnormality. The periwound has tenderness on palpation. Wound #9 status is Open. Original cause of wound was Gradually Appeared. The wound is located on the Right,Distal Achilles. The wound measures 5.5cm length x 2cm width x 0.2cm depth; 8.639cm^2 area and 1.728cm^3 volume. The wound is limited to skin breakdown. There is no tunneling or undermining noted. There is a medium amount of serous drainage noted. There is medium  (34-66%) pink granulation within the wound bed. There is a medium (34-66%) amount of necrotic tissue within the wound bed including Adherent Slough. The periwound skin appearance exhibited: Dry/Scaly, Moist. Periwound temperature was noted as No Abnormality. The periwound has tenderness on palpation. Assessment Active Problems ICD-10 E11.621 - Type 2 diabetes mellitus with foot ulcer L97.512 - Non-pressure chronic ulcer of other part of right foot with fat layer exposed E11.51 - Type 2 diabetes mellitus with diabetic peripheral angiopathy without gangrene S31.104A - Unspecified open wound of abdominal wall, left lower quadrant without penetration into peritoneal cavity, initial encounter S31.103D - Unspecified open wound of abdominal wall, right lower quadrant without penetration into peritoneal cavity, subsequent encounter Procedures Wound #14 Wound #14 is an Open Surgical Wound located on the Right Amputation Site - Digit . There was a Skin/Subcutaneous Tissue Debridement ([id number]) debridement with total area of 0.09 sq cm performed by Maxwell Caul, MD. with the following instrument(s): Curette to remove Viable and Non-Viable tissue/material including Exudate, Fibrin/Slough, and Subcutaneous after achieving pain control using Lidocaine 4% Topical Solution. A time out was conducted at 09:30, prior to the start of the procedure. A Minimum amount of bleeding was controlled with Pressure. The procedure was tolerated well with a pain level of 0 throughout and a pain level of 0 following the procedure. Post Debridement Measurements: 0.5cm length x 0.5cm width x 0.3cm depth; 0.059cm^3 volume. Character of Wound/Ulcer Post Debridement requires further debridement. Severity of Tissue Post Debridement is: Fat layer exposed. Post procedure Diagnosis Wound #14: Same as Pre-Procedure Wound #9 LULLA, LINVILLE ([id number]) Wound #9 is a Diabetic Wound/Ulcer of the Lower Extremity located on  the Right,Distal Achilles . There was a Skin/Subcutaneous Tissue Debridement ([id number]) debridement with total area of 11 sq cm performed by Maxwell Caul, MD. with the following instrument(s): Curette to remove Viable and Non-Viable tissue/material including Exudate, Fibrin/Slough, and Subcutaneous after achieving pain control using Lidocaine 4% Topical Solution. A time out was conducted at 09:30, prior to the start of the procedure. A Minimum amount of bleeding was controlled with Pressure. The procedure was tolerated well with a pain level of 0 throughout and a pain level of 0 following the procedure. Post Debridement Measurements: 5.5cm length x 2cm width x 0.2cm depth; 1.728cm^3 volume. Character of Wound/Ulcer Post Debridement requires further debridement. Severity of Tissue Post Debridement is: Fat layer exposed. Post procedure Diagnosis Wound #9: Same as Pre-Procedure #1 using a #5 curet I remove surface slough nonviable subcutaneous tissue from the surface of the Achilles wound. Hemostasis with direct pressure tolerated well. Also a debridement of the fourth toe amputation site of surface  slough and nonviable subcutaneous tissue. Plan Wound Cleansing: Wound #14 Right Amputation Site - Digit: Clean wound with Normal Saline. Wound #17 Right,Proximal Achilles: Clean wound with Normal Saline. Wound #9 Right,Distal Achilles: Clean wound with Normal Saline. Anesthetic: Wound #14 Right Amputation Site - Digit: Topical Lidocaine 4% cream applied to wound bed prior to debridement - all wounds for clinic use only Wound #17 Right,Proximal Achilles: Topical Lidocaine 4% cream applied to wound bed prior to debridement - all wounds for clinic use only Wound #9 Right,Distal Achilles: Topical Lidocaine 4% cream applied to wound bed prior to debridement - all wounds for clinic use only Skin Barriers/Peri-Wound Care: Wound #14 Right Amputation Site - Digit: Skin Prep Primary Wound  Dressing: Wound #14 Right Amputation Site - Digit: Hydrafera Blue - Hydrafera Blue Ready Transfer pack light into wound with enough out so you can gently pull it out Wound #17 Right,Proximal Achilles: Hydrafera Blue - Hydrafera Blue Ready Transfer pack light into wound with enough out so you can gently pull it out Wound #9 Right,Distal Achilles: Hydrafera Blue - [address] Ready Transfer DORCAS, MELITO ([id number]) Secondary Dressing: Wound #14 Right Amputation Site - Digit: Dry Gauze Other - band-aide Wound #9 Right,Distal Achilles: Conform/Kerlix - tape, stretch netting #5 Foam Dressing Change Frequency: Wound #14 Right Amputation Site - Digit: Change dressing every other day. Wound #17 Right,Proximal Achilles: Change dressing every other day. Wound #9 Right,Distal Achilles: Change dressing every other day. Follow-up Appointments: Wound #14 Right Amputation Site - Digit: Return Appointment in 1 week. Wound #17 Right,Proximal Achilles: Return Appointment in 1 week. Wound #9 Right,Distal Achilles: Return Appointment in 1 week. Edema Control: Wound #14 Right Amputation Site - Digit: Elevate legs to the level of the heart and pump ankles as often as possible Wound #17 Right,Proximal Achilles: Elevate legs to the level of the heart and pump ankles as often as possible Wound #9 Right,Distal Achilles: Elevate legs to the level of the heart and pump ankles as often as possible Off-Loading: Wound #14 Right Amputation Site - Digit: Other: - Float heels SNF- Please provide a heel offloading boot for HS. Wound #9 Right,Distal Achilles: Other: - Float heels SNF- Please provide a heel offloading boot for HS. General Notes: Order Apligraph for next week. hydrafera blue to all sites appligraf next week Electronic Signature(s) Signed: 05/06/2016 4:39:43 PM By: Baltazar Najjar MD Entered By: Baltazar Najjar on 05/06/2016 11:37:24 MADELON, WELSCH ([id number]) LAURITA, PERON  ([id number]) -------------------------------------------------------------------------------- SuperBill Details Patient Name: [name] Date of Service: 05/06/2016 Medical Record Patient Account Number: [id number] Number: Treating RN: Phillis Haggis Mar 24, 1934 (82 [name] Other Clinician: Date of Birth/Sex: Female) Treating ROBSON, MICHAEL Primary Care Physician/Extender: Rex Kras, Heidi Physician: Weeks in Treatment: 20 Referring Physician: [name] Diagnosis Coding ICD-10 Codes Code Description E11.621 Type 2 diabetes mellitus with foot ulcer L97.512 Non-pressure chronic ulcer of other part of right foot with fat layer exposed E11.51 Type 2 diabetes mellitus with diabetic peripheral angiopathy without gangrene Unspecified open wound of abdominal wall, left lower quadrant without penetration into S31.104A peritoneal cavity, initial encounter Unspecified open wound of abdominal wall, right lower quadrant without penetration into S31.103D peritoneal cavity, subsequent encounter Facility Procedures CPT4 Code Description: [id number] 11042 - DEB SUBQ TISSUE 20 SQ CM/< ICD-10 Description Diagnosis L97.512 Non-pressure chronic ulcer of other part of right fo E11.621 Type 2 diabetes mellitus with foot ulcer Modifier: ot with fat la Quantity: 1 yer exposed Physician Procedures CPT4 Code Description: [id number] 11042 - WC  PHYS SUBQ TISS 20 SQ CM ICD-10 Description Diagnosis L97.512 Non-pressure chronic ulcer of other part of right fo E11.621 Type 2 diabetes mellitus with foot ulcer Modifier: ot with fat lay Quantity: 1 er exposed Electronic Signature(s) Signed: 05/06/2016 4:39:43 PM By: Baltazar Najjar MD Entered By: Baltazar Najjar on 05/06/2016 11:38:01

## 2016-05-07 NOTE — Patient Instructions (Signed)
  Your procedure is scheduled on: May 14, 2016 (Thursday) Report to Same Day Surgery 2nd floor medical mall [facility](Medical Mall Entrance-take elevator on left to 2nd floor.  Check in with surgery information desk.) To find out your arrival time please call [phone] between 1PM - 3PM on May 13, 2016 (Wednesday)  Remember: Instructions that are not followed completely may result in serious medical risk, up to and including death, or upon the discretion of your surgeon and anesthesiologist your surgery may need to be rescheduled.    _x___ 1. Do not eat food or drink liquids after midnight. No gum chewing or hard candies.     __x__ 2. No Alcohol for 24 hours before or after surgery.   __x__3. No Smoking for 24 prior to surgery.   ____  4. Bring all medications with you on the day of surgery if instructed.    __x__ 5. Notify your doctor if there is any change in your medical condition     (cold, fever, infections).     Do not wear jewelry, make-up, hairpins, clips or nail polish.  Do not wear lotions, powders, or perfumes. You may wear deodorant.  Do not shave 48 hours prior to surgery. Men may shave face and neck.  Do not bring valuables to the hospital.    [facility] is not responsible for any belongings or valuables.               Contacts, dentures or bridgework may not be worn into surgery.  Leave your suitcase in the car. After surgery it may be brought to your room.  For patients admitted to the hospital, discharge time is determined by your treatment team.   Patients discharged the day of surgery will not be allowed to drive home.  You will need someone to drive you home and stay with you the night of your procedure.    Please read over the following fact sheets that you were given:   [facility] and or MRSA Information   _x___ Take these medicines the morning of surgery with A SIP OF WATER:    1. Diltiazem  2.  Ranitidine  3.  4.  5.  6.  ____Fleets enema or Magnesium Citrate as directed.   ___ Use CHG Soap or sage wipes as directed on instruction sheet   __X__ Use inhalers on the day of surgery and bring to hospital day of surgery (USE ALBUTEROL INHALER THE MORNING OF SURGERY AND BRING TO HOSPITAL)  ____ Stop metformin 2 days prior to surgery    _x___ Take 1/2 of usual insulin dose the night before surgery and none on the morning of surgery (NO INSULIN THE MORNING OF SURGERY),            __X__ Stop Aspirin, Coumadin, Pllavix ,Eliquis, Effient, or Pradaxa (STOP WARFARIN ON DECEMBER  9)  x__ Stop Anti-inflammatories such as Advil, Aleve, Ibuprofen, Motrin, Naproxen,          Naprosyn, Goodies powders or aspirin products. Ok to take Tylenol.   _x___ Stop supplements until after surgery.  (STOP BIOTIN AND OMEGA-3 UNTIL AFTER SURGERY)  ____ Bring C-Pap to the hospital.

## 2016-05-07 NOTE — Progress Notes (Signed)
BRITTANEY, BEAULIEU ([id number]) Visit Report for 05/06/2016 Arrival Information Details Patient Name: [name], [name]. Date of Service: 05/06/2016 8:45 AM Medical Record Patient Account Number: [id number] Number: Treating RN: Phillis Haggis [birth year] (82 [name] Other Clinician: Date of Birth/Sex: Female) Treating ROBSON, MICHAEL Primary Care Physician: [name] Physician/Extender: G Referring Physician: Charolotte Capuchin in Treatment: 20 Visit Information History Since Last Visit All ordered tests and consults were completed: No Patient Arrived: Wheel Chair Added or deleted any medications: No Arrival Time: 09:01 Any new allergies or adverse reactions: No Accompanied By: self Had a fall or experienced change in No Transfer Assistance: EasyPivot Patient activities of daily living that may affect Lift risk of falls: Patient Identification Verified: Yes Signs or symptoms of abuse/neglect since last No Secondary Verification Process Yes visito Completed: Hospitalized since last visit: No Patient Requires Transmission- No Pain Present Now: No Based Precautions: Patient Has Alerts: Yes Patient Alerts: Patient on Blood Thinner DMII Warfarin ABI [place] Bilateral NO BP RIGHT ARM Electronic Signature(s) Signed: 05/06/2016 4:44:01 PM By: Alejandro Mulling Entered By: Alejandro Mulling on 05/06/2016 09:01:51 [name] ([id number]) -------------------------------------------------------------------------------- Encounter Discharge Information Details Patient Name: [name]. Date of Service: 05/06/2016 8:45 AM Medical Record Patient Account Number: [id number] Number: Treating RN: Phillis Haggis 1933-12-22 (82 [name] Other Clinician: Date of Birth/Sex: Female) Treating ROBSON, MICHAEL Primary Care Physician: [name] Physician/Extender: G Referring Physician: Charolotte Capuchin in Treatment: 20 Encounter Discharge Information  Items Discharge Pain Level: 0 Discharge Condition: Stable Ambulatory Status: Wheelchair Discharge Destination: Nursing Home Transportation: Private Auto Accompanied By: daughter Schedule Follow-up Appointment: Yes Medication Reconciliation completed and provided to Patient/Care Yes Bellanie Matthew: Provided on Clinical Summary of Care: 05/06/2016 Form Type Recipient Paper Patient [facility] Electronic Signature(s) Signed: 05/06/2016 4:44:01 PM By: Alejandro Mulling Previous Signature: 05/06/2016 9:53:30 AM Version By: Gwenlyn Perking Entered By: Alejandro Mulling on 05/06/2016 10:18:44 [name] ([id number]) -------------------------------------------------------------------------------- Lower Extremity Assessment Details Patient Name: [name] Date of Service: 05/06/2016 8:45 AM Medical Record Patient Account Number: [id number] Number: Treating RN: Phillis Haggis 01/15/1934 (82 [name] Other Clinician: Date of Birth/Sex: Female) Treating ROBSON, MICHAEL Primary Care Physician: [name] Physician/Extender: G Referring Physician: Charolotte Capuchin in Treatment: 20 Vascular Assessment Pulses: Posterior Tibial Dorsalis Pedis Palpable: [Right:No] Doppler: [Right:Monophasic] Extremity colors, hair growth, and conditions: Extremity Color: [Right:Hyperpigmented] Temperature of Extremity: [Right:Warm] Capillary Refill: [Right:< 3 seconds] Toe Nail Assessment Left: Right: Thick: No Discolored: No Deformed: No Improper Length and Hygiene: No Electronic Signature(s) Signed: 05/06/2016 4:44:01 PM By: Alejandro Mulling Entered By: Alejandro Mulling on 05/06/2016 09:05:50 [name] ([id number]) -------------------------------------------------------------------------------- Multi Wound Chart Details Patient Name: [name] Date of Service: 05/06/2016 8:45 AM Medical Record Patient Account Number: [id number] Number: Treating RN: Phillis Haggis [birth year] (82 [name] Other Clinician: Date of Birth/Sex: Female) Treating ROBSON, MICHAEL Primary Care Physician: [name] Physician/Extender: G Referring Physician: Charolotte Capuchin in Treatment: 20 Vital Signs Height(in): 65 Pulse(bpm): 65 Weight(lbs): 160 Blood Pressure 130/55 (mmHg): Body Mass Index(BMI): 27 Temperature(F): Respiratory Rate 16 (breaths/min): Photos: [14:No Photos] [17:No Photos] [9:No Photos] Wound Location: [14:Right Amputation Site - Digit] [17:Right Achilles - Proximal] [9:Right Achilles - Distal] Wounding Event: [14:Surgical Injury] [17:Gradually Appeared] [9:Gradually Appeared] Primary Etiology: [14:Open Surgical Wound] [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] [9:the Lower Extremity] Secondary Etiology: [14:N/A] [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] Comorbid History: [14:Arrhythmia, Congestive Heart Failure, Hypertension, Type II Diabetes] [17:Arrhythmia, Congestive Arrhythmia, Congestive Heart Failure, Hypertension, Type II  Diabetes] [9:Heart Failure, Hypertension, Type II Diabetes] Date Acquired: [14:11/11/2015] [17:06/03/2015] [9:06/02/2015] Weeks of Treatment: [14:18] [17:2] [9:20] Wound Status: [14:Open] [17:Open] [9:Open] Pending Amputation on No [17:No] [9:Yes] Presentation: Measurements L x W x D 0.3x0.3x0.3 [17:1x0.5x0.5] [9:5.5x2x0.2] (cm) Area (cm) : [14:0.071] [17:0.393] [9:8.639] Volume (cm) : [14:0.021] [17:0.196] [9:1.728] % Reduction in Area: [14:90.60%] [17:-100.50%] [9:73.50%] % Reduction in Volume: 72.00% [17:-100.00%] [9:82.30%] Position 1 (o'clock): [17:12] Maximum Distance 1 [17:3.6] (cm): Tunneling: [14:No] [17:Yes] [9:No] Classification: [17:Grade 1] [9:Grade 2] Full Thickness Without Exposed Support Structures Exudate Amount: Large Large Medium Exudate Type: Serous Serosanguineous Serous Exudate Color: amber red, brown amber Wound Margin: Flat and  Intact Distinct, outline attached N/A Granulation Amount: None Present (0%) Large (67-100%) Medium (34-66%) Granulation Quality: N/A Red Pink Necrotic Amount: Large (67-100%) Small (1-33%) Medium (34-66%) Exposed Structures: Fascia: No Fascia: No Fascia: No Fat: No Fat: No Fat: No Tendon: No Tendon: No Tendon: No Muscle: No Muscle: No Muscle: No Joint: No Joint: No Joint: No Bone: No Bone: No Bone: No Limited to Skin Limited to Skin Limited to Skin Breakdown Breakdown Breakdown Epithelialization: None None N/A Periwound Skin Texture: Edema: No No Abnormalities Noted No Abnormalities Noted Excoriation: No Induration: No Callus: No Crepitus: No Fluctuance: No Friable: No Rash: No Scarring: No Periwound Skin Dry/Scaly: Yes No Abnormalities Noted Moist: Yes Moisture: Maceration: No Dry/Scaly: Yes Moist: No Periwound Skin Color: Atrophie Blanche: No No Abnormalities Noted No Abnormalities Noted Cyanosis: No Ecchymosis: No Erythema: No Hemosiderin Staining: No Mottled: No Pallor: No Rubor: No Temperature: No Abnormality No Abnormality No Abnormality Tenderness on No Yes Yes Palpation: Wound Preparation: Ulcer Cleansing: Ulcer Cleansing: Ulcer Cleansing: Rinsed/Irrigated with Rinsed/Irrigated with Rinsed/Irrigated with Saline Saline Saline Topical Anesthetic Topical Anesthetic Topical Anesthetic Applied: Other: lidocaine Applied: Other: lidocaine Applied: Other: lidocaine 4% 4% 4% KELSHA, OLDER ([id number]) Treatment Notes Electronic Signature(s) Signed: 05/06/2016 4:44:01 PM By: Alejandro Mulling Entered By: Alejandro Mulling on 05/06/2016 09:20:52 [name] ([id number]) -------------------------------------------------------------------------------- Multi-Disciplinary Care Plan Details Patient Name: SHERRI, MCARTHY. Date of Service: 05/06/2016 8:45 AM Medical Record Patient Account Number: [id number] Number: Treating RN: Phillis Haggis 1934-05-02 (82 [name] Other Clinician: Date of Birth/Sex: Female) Treating ROBSON, MICHAEL Primary Care Physician: [name] Physician/Extender: G Referring Physician: Charolotte Capuchin in Treatment: 20 Active Inactive Abuse / Safety / Falls / Self Care Management Nursing Diagnoses: Impaired physical mobility Potential for falls Goals: Patient will remain injury free Date Initiated: 12/17/2015 Goal Status: Active Interventions: Assess fall risk on admission and as needed Notes: Necrotic Tissue Nursing Diagnoses: Impaired tissue integrity related to necrotic/devitalized tissue Knowledge deficit related to management of necrotic/devitalized tissue Goals: Necrotic/devitalized tissue will be minimized in the wound bed Date Initiated: 02/04/2016 Goal Status: Active Interventions: Assess patient pain level pre-, during and post procedure and prior to discharge Provide education on necrotic tissue and debridement process Treatment Activities: Apply topical anesthetic as ordered : 02/04/2016 Notes: JAZLIN, TAPSCOTT ([id number]) Wound/Skin Impairment Nursing Diagnoses: Impaired tissue integrity Goals: Patient/caregiver will verbalize understanding of skin care regimen Date Initiated: 12/17/2015 Goal Status: Active Ulcer/skin breakdown will have a volume reduction of 30% by week 4 Date Initiated: 12/17/2015 Goal Status: Active Ulcer/skin breakdown will have a volume reduction of 50% by week 8 Date Initiated: 12/17/2015 Goal Status: Active Ulcer/skin breakdown will have a volume reduction of 80% by week 12 Date Initiated: 12/17/2015 Goal Status: Active Ulcer/skin breakdown will heal within 14 weeks Date Initiated: 12/17/2015 Goal Status: Active Interventions: Assess patient/caregiver ability to obtain necessary  supplies Assess patient/caregiver ability to perform ulcer/skin care regimen upon admission and as needed Assess ulceration(s) every visit Notes: Electronic  Signature(s) Signed: 05/06/2016 4:44:01 PM By: Alejandro MullingPinkerton, Debra Entered By: Alejandro MullingPinkerton, Debra on 05/06/2016 09:20:45 [name], Natalie Y. ([id number]) -------------------------------------------------------------------------------- Pain Assessment Details Patient Name: [name], Natalie Y. Date of Service: 05/06/2016 8:45 AM Medical Record Patient Account Number: [id number] Number: Treating RN: Phillis Haggisinkerton, Debi November 02, 1933 (82 [name] Other Clinician: Date of Birth/Sex: Female) Treating ROBSON, MICHAEL Primary Care Physician: Rolm GalaGrandis, Heidi Physician/Extender: G Referring Physician: Charolotte CapuchinGrandis, Heidi Weeks in Treatment: 20 Active Problems Location of Pain Severity and Description of Pain Patient Has Paino No Site Locations With Dressing Change: No Pain Management and Medication Current Pain Management: Electronic Signature(s) Signed: 05/06/2016 4:44:01 PM By: Alejandro MullingPinkerton, Debra Entered By: Alejandro MullingPinkerton, Debra on 05/06/2016 09:01:58 [name], Natalie Y. ([id number]) -------------------------------------------------------------------------------- Patient/Caregiver Education Details Patient Name: [name], Natalie Y. Date of Service: 05/06/2016 8:45 AM Medical Record Patient Account Number: [id number] Number: Treating RN: Phillis Haggisinkerton, Debi November 02, 1933 (82 [name] Other Clinician: Date of Birth/Gender: Female) Treating ROBSON, MICHAEL Primary Care Physician: Rolm GalaGrandis, Heidi Physician/Extender: G Referring Physician: Charolotte CapuchinGrandis, Heidi Weeks in Treatment: 20 Education Assessment Education Provided To: Patient Education Topics Provided Wound/Skin Impairment: Handouts: Other: change dressing as ordered Methods: Demonstration, Explain/Verbal Responses: State content correctly Electronic Signature(s) Signed: 05/06/2016 4:44:01 PM By: Alejandro MullingPinkerton, Debra Entered By: Alejandro MullingPinkerton, Debra on 05/06/2016 10:18:55 [name], Natalie Y.  ([id number]) -------------------------------------------------------------------------------- Wound Assessment Details Patient Name: [name], Natalie Y. Date of Service: 05/06/2016 8:45 AM Medical Record Patient Account Number: [id number] Number: Treating RN: Phillis Haggisinkerton, Debi November 02, 1933 (82 [name] Other Clinician: Date of Birth/Sex: Female) Treating ROBSON, MICHAEL Primary Care Physician: Rolm GalaGrandis, Heidi Physician/Extender: G Referring Physician: Charolotte CapuchinGrandis, Heidi Weeks in Treatment: 20 Wound Status Wound Number: 14 Primary Open Surgical Wound Etiology: Wound Location: Right Amputation Site - Digit Wound Open Wounding Event: Surgical Injury Status: Date Acquired: 11/11/2015 Comorbid Arrhythmia, Congestive Heart Failure, Weeks Of Treatment: 18 History: Hypertension, Type II Diabetes Clustered Wound: No Photos Photo Uploaded By: Alejandro MullingPinkerton, Debra on 05/06/2016 09:27:50 Wound Measurements Length: (cm) 0.3 Width: (cm) 0.3 Depth: (cm) 0.3 Area: (cm) 0.071 Volume: (cm) 0.021 % Reduction in Area: 90.6% % Reduction in Volume: 72% Epithelialization: None Tunneling: No Undermining: No Wound Description Full Thickness Without Exposed Classification: Support Structures Wound Margin: Flat and Intact Exudate Large Amount: Exudate Type: Serous Exudate Color: amber Foul Odor After Cleansing: No Wound Bed Granulation Amount: None Present (0%) Exposed Structure [name], Theodosia Y. ([id number]) Necrotic Amount: Large (67-100%) Fascia Exposed: No Necrotic Quality: Adherent Slough Fat Layer Exposed: No Tendon Exposed: No Muscle Exposed: No Joint Exposed: No Bone Exposed: No Limited to Skin Breakdown Periwound Skin Texture Texture Color No Abnormalities Noted: No No Abnormalities Noted: No Callus: No Atrophie Blanche: No Crepitus: No Cyanosis: No Excoriation: No Ecchymosis: No Fluctuance: No Erythema: No Friable: No Hemosiderin Staining: No Induration: No Mottled:  No Localized Edema: No Pallor: No Rash: No Rubor: No Scarring: No Temperature / Pain Moisture Temperature: No Abnormality No Abnormalities Noted: No Dry / Scaly: Yes Maceration: No Moist: No Wound Preparation Ulcer Cleansing: Rinsed/Irrigated with Saline Topical Anesthetic Applied: Other: lidocaine 4%, Treatment Notes Wound #14 (Right Amputation Site - Digit) 1. Cleansed with: Clean wound with Normal Saline 2. Anesthetic Topical Lidocaine 4% cream to wound bed prior to debridement 3. Peri-wound Care: Skin Prep 4. Dressing Applied: Hydrafera Blue 5. Secondary Dressing Applied Dry Gauze Notes band-aide Electronic Signature(s) [name], Arleen Y. ([id number]) Signed: 05/06/2016 4:44:01 PM By: Alejandro MullingPinkerton, Debra Entered By: Alejandro MullingPinkerton, Debra on  05/06/2016 09:18:08 JEMIA, FATA ([id number]) -------------------------------------------------------------------------------- Wound Assessment Details Patient Name: ALEASHA, FREGEAU. Date of Service: 05/06/2016 8:45 AM Medical Record Patient Account Number: [id number] Number: Treating RN: Phillis Haggis 04-06-1934 (82 [name] Other Clinician: Date of Birth/Sex: Female) Treating ROBSON, MICHAEL Primary Care Physician: [name] Physician/Extender: G Referring Physician: Charolotte Capuchin in Treatment: 20 Wound Status Wound Number: 17 Primary Diabetic Wound/Ulcer of the Lower Etiology: Extremity Wound Location: Right Achilles - Proximal Secondary Arterial Insufficiency Ulcer Wounding Event: Gradually Appeared Etiology: Date Acquired: 06/03/2015 Wound Open Weeks Of Treatment: 2 Status: Clustered Wound: No Comorbid Arrhythmia, Congestive Heart History: Failure, Hypertension, Type II Diabetes Photos Photo Uploaded By: Alejandro Mulling on 05/06/2016 09:28:19 Wound Measurements Length: (cm) 1 Width: (cm) 0.5 Depth: (cm) 0.5 Area: (cm) 0.393 Volume: (cm) 0.196 % Reduction in Area: -100.5% % Reduction in  Volume: -100% Epithelialization: None Tunneling: Yes Position (o'clock): 12 Maximum Distance: (cm) 3.6 Undermining: No Wound Description Classification: Grade 1 Foul Odor Afte Wound Margin: Distinct, outline attached Exudate Amount: Large Exudate Type: Serosanguineous ALEXZANDRIA, MASSMAN ([id number]) r Cleansing: No Exudate Color: red, brown Wound Bed Granulation Amount: Large (67-100%) Exposed Structure Granulation Quality: Red Fascia Exposed: No Necrotic Amount: Small (1-33%) Fat Layer Exposed: No Necrotic Quality: Adherent Slough Tendon Exposed: No Muscle Exposed: No Joint Exposed: No Bone Exposed: No Limited to Skin Breakdown Periwound Skin Texture Texture Color No Abnormalities Noted: No No Abnormalities Noted: No Moisture Temperature / Pain No Abnormalities Noted: No Temperature: No Abnormality Tenderness on Palpation: Yes Wound Preparation Ulcer Cleansing: Rinsed/Irrigated with Saline Topical Anesthetic Applied: Other: lidocaine 4%, Treatment Notes Wound #17 (Right, Proximal Achilles) 1. Cleansed with: Clean wound with Normal Saline 2. Anesthetic Topical Lidocaine 4% cream to wound bed prior to debridement 3. Peri-wound Care: Skin Prep 4. Dressing Applied: Hydrafera Blue 5. Secondary Dressing Applied Dry Gauze Foam Kerlix/Conform 7. Secured with Tape Notes netting Electronic Signature(s) Signed: 05/06/2016 4:44:01 PM By: Alejandro Mulling Entered By: Alejandro Mulling on 05/06/2016 09:13:58 NGUYET, MERCER ([id number]) KATHLENE, YANO ([id number]) -------------------------------------------------------------------------------- Wound Assessment Details Patient Name: [name] Date of Service: 05/06/2016 8:45 AM Medical Record Patient Account Number: [id number] Number: Treating RN: Phillis Haggis 03-18-1934 (82 [name] Other Clinician: Date of Birth/Sex: Female) Treating ROBSON, MICHAEL Primary Care Physician: [name] Physician/Extender: G Referring Physician: Charolotte Capuchin in Treatment: 20 Wound Status Wound Number: 9 Primary Diabetic Wound/Ulcer of the Lower Etiology: Extremity Wound Location: Right Achilles - Distal Secondary Arterial Insufficiency Ulcer Wounding Event: Gradually Appeared Etiology: Date Acquired: 06/02/2015 Wound Open Weeks Of Treatment: 20 Status: Clustered Wound: No Comorbid Arrhythmia, Congestive Heart Pending Amputation On Presentation History: Failure, Hypertension, Type II Diabetes Photos Photo Uploaded By: Alejandro Mulling on 05/06/2016 09:28:20 Wound Measurements Length: (cm) 5.5 Width: (cm) 2 Depth: (cm) 0.2 Area: (cm) 8.639 Volume: (cm) 1.728 % Reduction in Area: 73.5% % Reduction in Volume: 82.3% Tunneling: No Undermining: No Wound Description Classification: Grade 2 Foul Odor Afte Exudate Amount: Medium Exudate Type: Serous Exudate Color: amber r Cleansing: No Wound Bed Granulation Amount: Medium (34-66%) Exposed Structure YUVAL, NOLET ([id number]) Granulation Quality: Pink Fascia Exposed: No Necrotic Amount: Medium (34-66%) Fat Layer Exposed: No Necrotic Quality: Adherent Slough Tendon Exposed: No Muscle Exposed: No Joint Exposed: No Bone Exposed: No Limited to Skin Breakdown Periwound Skin Texture Texture Color No Abnormalities Noted: No No Abnormalities Noted: No Moisture Temperature / Pain No Abnormalities Noted: No Temperature: No Abnormality Dry / Scaly: Yes Tenderness on Palpation: Yes Moist: Yes Wound  Preparation Ulcer Cleansing: Rinsed/Irrigated with Saline Topical Anesthetic Applied: Other: lidocaine 4%, Treatment Notes Wound #9 (Right, Distal Achilles) 1. Cleansed with: Clean wound with Normal Saline 2. Anesthetic Topical Lidocaine 4% cream to wound bed prior to debridement 3. Peri-wound Care: Skin Prep 4. Dressing Applied: Hydrafera Blue 5. Secondary Dressing Applied Dry  Gauze Foam Kerlix/Conform 7. Secured with Tape Notes netting Electronic Signature(s) Signed: 05/06/2016 4:44:01 PM By: Alejandro MullingPinkerton, Debra Entered By: Alejandro MullingPinkerton, Debra on 05/06/2016 09:12:28 [name], Traniece Y. ([id number]) -------------------------------------------------------------------------------- Vitals Details Patient Name: [name], Cornisha Y. Date of Service: 05/06/2016 8:45 AM Medical Record Patient Account Number: [id number] Number: Treating RN: Phillis Haggisinkerton, Debi March 02, 1934 (82 [name] Other Clinician: Date of Birth/Sex: Female) Treating ROBSON, MICHAEL Primary Care Physician: Rolm GalaGrandis, Heidi Physician/Extender: G Referring Physician: Charolotte CapuchinGrandis, Heidi Weeks in Treatment: 20 Vital Signs Time Taken: 09:02 Pulse (bpm): 65 Height (in): 65 Respiratory Rate (breaths/min): 16 Weight (lbs): 160 Blood Pressure (mmHg): 130/55 Body Mass Index (BMI): 26.6 Reference Range: 80 - 120 mg / dl Electronic Signature(s) Signed: 05/06/2016 4:44:01 PM By: Alejandro MullingPinkerton, Debra Entered By: Alejandro MullingPinkerton, Debra on 05/06/2016 09:05:09

## 2016-05-08 LAB — TYPE AND SCREEN
ABO/RH(D): O POS
Antibody Screen: NEGATIVE

## 2016-05-13 ENCOUNTER — Encounter: Payer: No Typology Code available for payment source | Admitting: Internal Medicine

## 2016-05-13 DIAGNOSIS — E11621 Type 2 diabetes mellitus with foot ulcer: Secondary | ICD-10-CM | POA: Diagnosis not present

## 2016-05-13 HISTORY — PX: HEEL DECUBITUS ULCER EXCISION: SUR510

## 2016-05-14 ENCOUNTER — Ambulatory Visit: Payer: Medicare Other | Admitting: Anesthesiology

## 2016-05-14 ENCOUNTER — Ambulatory Visit: Payer: Medicare Other

## 2016-05-14 ENCOUNTER — Encounter
Admission: RE | Disposition: A | Discharge status: Skilled Nursing Facility | Payer: Self-pay | Source: Ambulatory Visit | Attending: Vascular Surgery

## 2016-05-14 ENCOUNTER — Encounter: Payer: Self-pay | Admitting: *Deleted

## 2016-05-14 ENCOUNTER — Ambulatory Visit
Admission: RE | Admit: 2016-05-14 | Discharge: 2016-05-14 | Disposition: A | Discharge status: Skilled Nursing Facility | Payer: Medicare Other | Source: Ambulatory Visit | Attending: Vascular Surgery | Admitting: Vascular Surgery

## 2016-05-14 DIAGNOSIS — I509 Heart failure, unspecified: Secondary | ICD-10-CM | POA: Insufficient documentation

## 2016-05-14 DIAGNOSIS — Z800 Family history of malignant neoplasm of digestive organs: Secondary | ICD-10-CM | POA: Diagnosis not present

## 2016-05-14 DIAGNOSIS — Z823 Family history of stroke: Secondary | ICD-10-CM | POA: Insufficient documentation

## 2016-05-14 DIAGNOSIS — I482 Chronic atrial fibrillation: Secondary | ICD-10-CM | POA: Insufficient documentation

## 2016-05-14 DIAGNOSIS — X58XXXA Exposure to other specified factors, initial encounter: Secondary | ICD-10-CM | POA: Diagnosis not present

## 2016-05-14 DIAGNOSIS — T82898A Other specified complication of vascular prosthetic devices, implants and grafts, initial encounter: Secondary | ICD-10-CM | POA: Insufficient documentation

## 2016-05-14 DIAGNOSIS — N186 End stage renal disease: Secondary | ICD-10-CM | POA: Diagnosis not present

## 2016-05-14 DIAGNOSIS — Z992 Dependence on renal dialysis: Secondary | ICD-10-CM | POA: Insufficient documentation

## 2016-05-14 DIAGNOSIS — E890 Postprocedural hypothyroidism: Secondary | ICD-10-CM | POA: Insufficient documentation

## 2016-05-14 DIAGNOSIS — T82838A Hemorrhage of vascular prosthetic devices, implants and grafts, initial encounter: Secondary | ICD-10-CM | POA: Diagnosis not present

## 2016-05-14 DIAGNOSIS — Z9842 Cataract extraction status, left eye: Secondary | ICD-10-CM | POA: Diagnosis not present

## 2016-05-14 DIAGNOSIS — E1122 Type 2 diabetes mellitus with diabetic chronic kidney disease: Secondary | ICD-10-CM | POA: Diagnosis not present

## 2016-05-14 DIAGNOSIS — Z8673 Personal history of transient ischemic attack (TIA), and cerebral infarction without residual deficits: Secondary | ICD-10-CM | POA: Diagnosis not present

## 2016-05-14 DIAGNOSIS — I272 Pulmonary hypertension, unspecified: Secondary | ICD-10-CM | POA: Diagnosis not present

## 2016-05-14 DIAGNOSIS — Z96643 Presence of artificial hip joint, bilateral: Secondary | ICD-10-CM | POA: Insufficient documentation

## 2016-05-14 DIAGNOSIS — D649 Anemia, unspecified: Secondary | ICD-10-CM | POA: Insufficient documentation

## 2016-05-14 DIAGNOSIS — K219 Gastro-esophageal reflux disease without esophagitis: Secondary | ICD-10-CM | POA: Insufficient documentation

## 2016-05-14 DIAGNOSIS — Z9841 Cataract extraction status, right eye: Secondary | ICD-10-CM | POA: Diagnosis not present

## 2016-05-14 DIAGNOSIS — M1990 Unspecified osteoarthritis, unspecified site: Secondary | ICD-10-CM | POA: Insufficient documentation

## 2016-05-14 DIAGNOSIS — Z9049 Acquired absence of other specified parts of digestive tract: Secondary | ICD-10-CM | POA: Diagnosis not present

## 2016-05-14 DIAGNOSIS — Z833 Family history of diabetes mellitus: Secondary | ICD-10-CM | POA: Diagnosis not present

## 2016-05-14 DIAGNOSIS — I132 Hypertensive heart and chronic kidney disease with heart failure and with stage 5 chronic kidney disease, or end stage renal disease: Secondary | ICD-10-CM | POA: Diagnosis not present

## 2016-05-14 DIAGNOSIS — G2581 Restless legs syndrome: Secondary | ICD-10-CM | POA: Diagnosis not present

## 2016-05-14 DIAGNOSIS — I739 Peripheral vascular disease, unspecified: Secondary | ICD-10-CM | POA: Diagnosis not present

## 2016-05-14 DIAGNOSIS — Z89421 Acquired absence of other right toe(s): Secondary | ICD-10-CM | POA: Diagnosis not present

## 2016-05-14 HISTORY — PX: PERIPHERAL VASCULAR CATHETERIZATION: SHX172C

## 2016-05-14 HISTORY — PX: REVISON OF ARTERIOVENOUS FISTULA: SHX6074

## 2016-05-14 LAB — PROTIME-INR
INR: 1.17
PROTHROMBIN TIME: 15 s (ref 11.4–15.2)

## 2016-05-14 LAB — BASIC METABOLIC PANEL
Anion gap: 15 (ref 5–15)
BUN: 55 mg/dL — AB (ref 6–20)
CALCIUM: 8.9 mg/dL (ref 8.9–10.3)
CHLORIDE: 95 mmol/L — AB (ref 101–111)
CO2: 23 mmol/L (ref 22–32)
CREATININE: 5.08 mg/dL — AB (ref 0.44–1.00)
GFR calc Af Amer: 8 mL/min — ABNORMAL LOW (ref >60)
GFR calc non Af Amer: 7 mL/min — ABNORMAL LOW (ref >60)
GLUCOSE: 108 mg/dL — AB (ref 65–99)
Potassium: 4.4 mmol/L (ref 3.5–5.1)
Sodium: 133 mmol/L — ABNORMAL LOW (ref 135–145)

## 2016-05-14 LAB — APTT: aPTT: 34 seconds (ref 24–36)

## 2016-05-14 LAB — GLUCOSE, CAPILLARY
GLUCOSE-CAPILLARY: 97 mg/dL (ref 65–99)
GLUCOSE-CAPILLARY: 100 mg/dL — AB (ref 65–99)

## 2016-05-14 SURGERY — REVISON OF ARTERIOVENOUS FISTULA
Anesthesia: General | Site: Groin | Laterality: Right | Wound class: Clean

## 2016-05-14 MED ORDER — SODIUM CHLORIDE 0.9 % IV SOLN
INTRAVENOUS | Status: DC | PRN
  Administered 2016-05-14: 30 ug/min via INTRAVENOUS

## 2016-05-14 MED ORDER — CEFAZOLIN SODIUM-DEXTROSE 2-4 GM/100ML-% IV SOLN
2.0000 g | INTRAVENOUS | Status: AC
  Administered 2016-05-14: 2 g via INTRAVENOUS

## 2016-05-14 MED ORDER — SODIUM CHLORIDE 0.9 % IV SOLN
INTRAVENOUS | Status: DC | PRN
  Administered 2016-05-14: 18:00:00 40 mL via INTRAMUSCULAR

## 2016-05-14 MED ORDER — SODIUM CHLORIDE 0.9 % IJ SOLN
INTRAMUSCULAR | Status: AC
  Filled 2016-05-14: qty 100

## 2016-05-14 MED ORDER — HEPARIN SODIUM (PORCINE) 5000 UNIT/ML IJ SOLN
INTRAMUSCULAR | Status: AC
  Filled 2016-05-14: qty 1

## 2016-05-14 MED ORDER — CEFAZOLIN SODIUM-DEXTROSE 2-4 GM/100ML-% IV SOLN
INTRAVENOUS | Status: AC
  Administered 2016-05-14: 2 g via INTRAVENOUS
  Filled 2016-05-14: qty 100

## 2016-05-14 MED ORDER — PROPOFOL 10 MG/ML IV BOLUS
INTRAVENOUS | Status: DC | PRN
  Administered 2016-05-14: 100 mg via INTRAVENOUS

## 2016-05-14 MED ORDER — CHLORHEXIDINE GLUCONATE CLOTH 2 % EX PADS: 6.0000 | MEDICATED_PAD | Freq: Once | CUTANEOUS | Status: DC

## 2016-05-14 MED ORDER — EVICEL 2 ML EX KIT
PACK | CUTANEOUS | Status: AC
  Filled 2016-05-14: qty 1

## 2016-05-14 MED ORDER — SODIUM CHLORIDE 0.9 % IJ SOLN
INTRAMUSCULAR | Status: AC
  Filled 2016-05-14: qty 20

## 2016-05-14 MED ORDER — TRAMADOL HCL 50 MG PO TABS: 50.0000 mg | ORAL_TABLET | Freq: Four times a day (QID) | ORAL | 0 refills | Status: DC | PRN

## 2016-05-14 MED ORDER — SODIUM CHLORIDE 0.9 % IV SOLN: INTRAVENOUS | Status: DC | PRN

## 2016-05-14 MED ORDER — PAPAVERINE HCL 30 MG/ML IJ SOLN
INTRAMUSCULAR | Status: AC
  Filled 2016-05-14: qty 2

## 2016-05-14 MED ORDER — HEPARIN SODIUM (PORCINE) 1000 UNIT/ML IJ SOLN
INTRAMUSCULAR | Status: DC | PRN
  Administered 2016-05-14: 3000 [IU] via INTRAVENOUS

## 2016-05-14 MED ORDER — LIDOCAINE HCL (CARDIAC) 20 MG/ML IV SOLN
INTRAVENOUS | Status: DC | PRN
  Administered 2016-05-14: 40 mg via INTRAVENOUS

## 2016-05-14 MED ORDER — PHENYLEPHRINE HCL 10 MG/ML IJ SOLN
INTRAMUSCULAR | Status: DC | PRN
  Administered 2016-05-14 (×7): 100 ug via INTRAVENOUS

## 2016-05-14 MED ORDER — EPHEDRINE SULFATE 50 MG/ML IJ SOLN
INTRAMUSCULAR | Status: DC | PRN
  Administered 2016-05-14: 10 mg via INTRAVENOUS

## 2016-05-14 MED ORDER — SODIUM CHLORIDE 0.9 % IV SOLN
INTRAVENOUS | Status: DC
  Administered 2016-05-14: 50 mL/h via INTRAVENOUS
  Administered 2016-05-14: 15:00:00 via INTRAVENOUS

## 2016-05-14 MED ORDER — ONDANSETRON HCL 4 MG/2ML IJ SOLN
INTRAMUSCULAR | Status: DC | PRN
  Administered 2016-05-14: 4 mg via INTRAVENOUS

## 2016-05-14 MED ORDER — FENTANYL CITRATE (PF) 100 MCG/2ML IJ SOLN
INTRAMUSCULAR | Status: DC | PRN
  Administered 2016-05-14: 50 ug via INTRAVENOUS
  Administered 2016-05-14: 25 ug via INTRAVENOUS

## 2016-05-14 SURGICAL SUPPLY — 59 items
BAG DECANTER FOR FLEXI CONT (MISCELLANEOUS) ×4 IMPLANT
BLADE SURG SZ11 CARB STEEL (BLADE) ×4 IMPLANT
BOOT SUTURE AID YELLOW STND (SUTURE) ×4 IMPLANT
BRUSH SCRUB 4% CHG (MISCELLANEOUS) ×4 IMPLANT
CANISTER SUCT 1200ML W/VALVE (MISCELLANEOUS) ×4 IMPLANT
CATH PALINDROME-P 44CM KIT (CATHETERS) ×2
CHLORAPREP W/TINT 26ML (MISCELLANEOUS) ×8 IMPLANT
CLIP SPRNG 6MM S-JAW DBL (CLIP) ×4
COVER PROBE FLX POLY STRL (MISCELLANEOUS) ×4 IMPLANT
DERMABOND ADVANCED (GAUZE/BANDAGES/DRESSINGS) ×2
DERMABOND ADVANCED .7 DNX12 (GAUZE/BANDAGES/DRESSINGS) ×2 IMPLANT
ELECT CAUTERY BLADE 6.4 (BLADE) ×4 IMPLANT
ELECT REM PT RETURN 9FT ADLT (ELECTROSURGICAL) ×4
ELECTRODE REM PT RTRN 9FT ADLT (ELECTROSURGICAL) ×2 IMPLANT
EVICEL 2ML SEALANT HUMAN (Miscellaneous) IMPLANT
GEL ULTRASOUND 20GR AQUASONIC (MISCELLANEOUS) IMPLANT
GLOVE BIO SURGEON STRL SZ7 (GLOVE) ×8 IMPLANT
GLOVE INDICATOR 7.5 STRL GRN (GLOVE) ×8 IMPLANT
GLOVE SURG SYN 6.5 ES PF (GLOVE) ×4 IMPLANT
GOWN STRL REUS W/ TWL LRG LVL3 (GOWN DISPOSABLE) ×4 IMPLANT
GOWN STRL REUS W/ TWL XL LVL3 (GOWN DISPOSABLE) ×4 IMPLANT
GOWN STRL REUS W/TWL LRG LVL3 (GOWN DISPOSABLE) ×4
GOWN STRL REUS W/TWL XL LVL3 (GOWN DISPOSABLE) ×4
GRAFT COLLAGEN VASCULAR 8X40 (Vascular Products) ×4 IMPLANT
HEMOSTAT SURGICEL 2X3 (HEMOSTASIS) ×8 IMPLANT
IV NS 500ML (IV SOLUTION) ×2
IV NS 500ML BAXH (IV SOLUTION) ×2 IMPLANT
KIT CATH 64X44X15FR RVRS (CATHETERS) ×2 IMPLANT
KIT RM TURNOVER STRD PROC AR (KITS) ×4 IMPLANT
LABEL OR SOLS (LABEL) ×4 IMPLANT
LOOP RED MAXI  1X406MM (MISCELLANEOUS) ×2
LOOP VESSEL MAXI 1X406 RED (MISCELLANEOUS) ×2 IMPLANT
LOOP VESSEL MINI 0.8X406 BLUE (MISCELLANEOUS) ×2 IMPLANT
LOOPS BLUE MINI 0.8X406MM (MISCELLANEOUS) ×2
NEEDLE FILTER BLUNT 18X 1/2SAF (NEEDLE) ×2
NEEDLE FILTER BLUNT 18X1 1/2 (NEEDLE) ×2 IMPLANT
NEEDLE HYPO 30X.5 LL (NEEDLE) IMPLANT
NS IRRIG 500ML POUR BTL (IV SOLUTION) ×4 IMPLANT
PACK ANGIOGRAPHY (CUSTOM PROCEDURE TRAY) ×4 IMPLANT
PACK EXTREMITY ARMC (MISCELLANEOUS) ×4 IMPLANT
PAD PREP 24X41 OB/GYN DISP (PERSONAL CARE ITEMS) ×4 IMPLANT
SOLUTION CELL SAVER (CLIP) ×2 IMPLANT
STOCKINETTE STRL 4IN 9604848 (GAUZE/BANDAGES/DRESSINGS) ×4 IMPLANT
SUT MNCRL AB 4-0 PS2 18 (SUTURE) ×8 IMPLANT
SUT PROLENE 2 0 SH DA (SUTURE) IMPLANT
SUT PROLENE 6 0 BV (SUTURE) ×16 IMPLANT
SUT SILK 2 0 (SUTURE) ×2
SUT SILK 2 0 SH (SUTURE) ×4 IMPLANT
SUT SILK 2-0 18XBRD TIE 12 (SUTURE) ×2 IMPLANT
SUT SILK 3 0 (SUTURE) ×2
SUT SILK 3-0 18XBRD TIE 12 (SUTURE) ×2 IMPLANT
SUT SILK 4 0 (SUTURE) ×2
SUT SILK 4-0 18XBRD TIE 12 (SUTURE) ×2 IMPLANT
SUT VIC AB 3-0 SH 27 (SUTURE) ×4
SUT VIC AB 3-0 SH 27X BRD (SUTURE) ×4 IMPLANT
SYR 20CC LL (SYRINGE) ×4 IMPLANT
SYR 3ML LL SCALE MARK (SYRINGE) ×4 IMPLANT
SYR TB 1ML 27GX1/2 LL (SYRINGE) IMPLANT
TOWEL OR 17X26 4PK STRL BLUE (TOWEL DISPOSABLE) ×4 IMPLANT

## 2016-05-14 NOTE — Transfer of Care (Signed)
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 ) (Right) DIALYSIS/PERMA CATHETER INSERTION (Left)  Patient Location: PACU  Anesthesia Type:General  Level of Consciousness: sedated  Airway & Oxygen Therapy: Patient Spontanous Breathing and Patient connected to face mask oxygen  Post-op Assessment: Report given to RN and Post -op Vital signs reviewed and stable  Post vital signs: Reviewed and stable  Last Vitals:  Vitals:   05/14/16 1409 05/14/16 1757  BP: (!) 133/56 (!) 123/48  Pulse: 79 73  Resp: 15 16  Temp: 36.4 C 36.2 C    Last Pain:  Vitals:   05/14/16 1409  TempSrc: Oral  PainSc: 0-No pain      Patients Stated Pain Goal: 0 (05/14/16 1409)  Complications: No apparent anesthesia complications

## 2016-05-14 NOTE — Progress Notes (Addendum)
AUDRYANNA, ZURITA ([id number]) Visit Report for 05/13/2016 Chief Complaint Document Details Patient Name: [name], [name]. Date of Service: 05/13/2016 8:45 AM Medical Record Patient Account Number: [id number] Number: Treating RN: Phillis Haggis 1934/04/24 (82 y.o. Other Clinician: Date of Birth/Sex: Female) Treating Kanoa Phillippi Primary Care Physician/Extender: Rex Kras, Heidi Physician: Referring Physician: Charolotte Capuchin in Treatment: 21 Information Obtained from: Patient Chief Complaint Ms. Strohmeyer presents today for follow-up evaluation of her diabetic foot ulcers and abdominal wound. Electronic Signature(s) Signed: 05/13/2016 4:39:00 PM By: Baltazar Najjar MD Entered By: Baltazar Najjar on 05/13/2016 11:42:33 [name] ([id number]) -------------------------------------------------------------------------------- Cellular or Tissue Based Product Details Patient Name: CARISA, BACKHAUS. Date of Service: 05/13/2016 8:45 AM Medical Record Patient Account Number: [id number] Number: Treating RN: Phillis Haggis 1934-01-01 (82 y.o. Other Clinician: Date of Birth/Sex: Female) Treating Shanigua Gibb Primary Care Physician/Extender: Rex Kras, Heidi Physician: Referring Physician: Charolotte Capuchin in Treatment: 21 Cellular or Tissue Based Wound #9 Right,Distal Achilles Product Type Applied to: Performed By: Physician Maxwell Caul, MD Cellular or Tissue Based Apligraf Product Type: Pre-procedure Yes - 09:35 Verification/Time Out Taken: Location: trunk / arms / legs Wound Size (sq cm): 10.2 Product Size (sq cm): 44 Waste Size (sq cm): 33.8 Waste Reason: WOUND SIZE Amount of Product Applied (sq cm): 10.2 Lot #: [id number] Expiration Date: 05/20/2016 Fenestrated: Yes Instrument: Blade Reconstituted: Yes Solution Type: NORMAL SALINE Solution Amount: Lot #: [id number] Solution Expiration 11/29/2017 Date: Secured:  Yes Secured With: Steri-Strips Dressing Applied: Yes Primary Dressing: MEPITEL Procedural Pain: 0 Post Procedural Pain: 0 Response to Treatment: Procedure was tolerated well Post Procedure Diagnosis Same as Pre-procedure TAHARA, RUFFINI ([id number]) Electronic Signature(s) Signed: 05/13/2016 4:39:00 PM By: Baltazar Najjar MD Entered By: Baltazar Najjar on 05/13/2016 11:41:07 [name] ([id number]) -------------------------------------------------------------------------------- HPI Details Patient Name: [name] Date of Service: 05/13/2016 8:45 AM Medical Record Patient Account Number: [id number] Number: Treating RN: Phillis Haggis 12/06/1933 (82 y.o. Other Clinician: Date of Birth/Sex: Female) Treating Uzair Godley Primary Care Physician/Extender: Rex Kras, Heidi Physician: Referring Physician: Charolotte Capuchin in Treatment: 21 History of Present Illness Location: right posterior heel, right Achilles, right lower quadrant abdomen, right fourth toe amp site Quality: denies pain to any wound Severity: not applicable Timing: denies pain HPI Description: 82 year old patient who is known to be diabetic, was referred to [place] by Dr. Gavin Potters for a right heel ulceration which she's had for a while. She was recently in hospital for a pneumonia and at that time and got delirious and was disoriented and sometime during this time developed a stage II ulcer on her right heel. Her past medical history is significant for bilateral pneumonia which was treated with injectable antibiotics and then to oral Levaquin which he has completed. She also has acute on chronic diastolic CHF, acute on chronic respiratory failure, end-stage renal disease on hemodialysis, atrial fibrillation, recent stroke, diabetes mellitus. The patient and her son are poor historians but from what I understand she was admitted to the hospital with an acute vascular compromise of her right  lower extremity and Dr. Wyn Quaker has done a surgical procedure and we are trying to obtain these notes. There are also some vascular workup done and we will try and obtain these notes. the injury to the left lower quadrant of abdomen and the suprapubic area have been there due to a bruise and have been there for several months and no intervention has been done. 10/11/2015 -- on review of  the electronics records it was noted that the patient was admitted to the hospital on 09/14/2015 with peripheral vascular disease with claudication, end-stage renal disease, pressure ulcer, chronic atrial fibrillation. She was seen by Dr. Wyn Quakerew who did her right lower extremity angiogram , angioplasty of the right anterior tibial artery and thrombolysis with TPA of the right popliteal artery, and thrombectomy. She was seen by Dr. Wyn Quakerew during this past week and he was pleased with the progress. He did say that if he took her to the operating room for any procedure he would debride the abdominal wound under anesthesia. She was also seen by Dr. Ether GriffinsFowler the podiatrist who thought that she may lose her right fourth toe at some stage may need an amputation of this. 10/21/2015 --patient known to Dr. Wyn Quakerew and his last office visit from 10/04/2015 has been reviewed. 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 She also had a persistent ulceration of the right heel and markedly this area and a right third and fourth toe and a small scab on the calf but these are dry and seemed to be improving. Patient also has a left carotid endarterectomy and multiple interventions to a right brachiocephalic AV fistula. After the visit he had recommended noninvasive studies to recheck her revascularization. Helane RimaHOPKINS, Donetta Y. ([id number]) He was off the impression that she would likely lose the right fourth toe and the third toe was  likely to heal. He was concerned about underlying muscle necrosis on her right heel and midfoot. 11/01/2015 -- an echo done in January of this year showed her left ventricular ejection fraction to be about 50-55%. The patient was seen by the PA and Dr. Driscilla Grammesdew's office and the plan was to take her to the operating room soon to have a debridement under anesthesia for the abdominal wall wound, the Achilles tendon on the right leg and amputation of the right fourth toe. The daughter and the patient do not feel that they would be able to undergo hyperbaric oxygen therapy 5 days a week for 6 weeks. 12/17/15; this is a medically complex woman who I note was recently in this clinic however I was not involved with her care. She returns today with multiple wounds; a) she has a wound in the mid abdomen that is been there since March of this year. I note that she is been to the overall for debridement recently. The exact etiology of this wound is not really clear b) left lower quadrant abdominal wound had some sanguinous drainage when she came in here. The patient fell in January and thinks this may have been secondary to a hematoma. c): The patient has 3 wounds on her right leg including a small wound on the right mid calf, a large area over the Achilles which currently has a wound VAC for the last 6 weeks, also a smaller wound on the distal part of the right heel. As far as I understand most of these wounds are currently been dressed with's calcium alginate. According to her daughter the Achilles wound under the wound VAC is doing well d) the patient is had an amputation of her left fifth toe in January and the right fourth toe 6 weeks ago secondary to diabetic PAD e) the patient has chronic renal failure on dialysis for the last 2 years secondary to type 2 diabetes on insulin. The daughter's knowledge there is been no biopsy  of the abdominal wounds given their current appearance and lack of undefined  etiology at have to wonder about calciphylaxis. 12/18/15:Addendum; I have reviewed [facility] I can see no relevant x-rays of the right heel. I note her arteriogram and revascularization of her right lower extremity in April 2017. She had debridement of both abdominal wounds and the right heel and Achilles wound on 11/07/15 at which time she had a right fourth toe ray amputation. The abdominal wounds were debridement again on 6/29. I do not see any relevant pathology of these abdominal wounds 12/24/15; culture I did of the drainage from the midline abdominal wound last week showed both Proteus and ampicillin sensitive enterococcus. I've given her a course of Augmentin adjusted on dialysis days. She has no specific complaints today. Been using Santyl to the abdominal wounds in the right leg wound and the wound VAC on the right Achilles which was initially prescribed by Dr. dew 12/31/15; I have done two punch biopsies of the large midline abdominal. 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 May have been a trauma component of the one on the left lower quadrant however the midline wound had no such history. She has a large area on the right Achilles heel with a wound VAC prescribed by Dr. dew. A small wound on the right anterior leg.Marland Kitchen UNFORTUNATELY she has 2 new wounds today. One on the left heel which is probably a pressure area. As well her previous amputation site of her right fourth toe has dehisced and now has a small wound with significant depth at the amputation site. 01/14/2016 -- she returns after 2 weeks and had had a punch biopsy of abdominal wound done the last visit -- had a biopsy of her midline abdominal wound done and the Pathology diagnosis is that of ulceration, necrosis and inflammation and negative for dysplasia and malignancy. 01/21/16. 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 In the meantime she has new wounds of  the left heel the right fourth toe amputation site is opened up. The back is stopped to the right heel area. 01/28/16; the abdominal wounds continued to improve. The extensive wound on her right Achilles also looks stable except for the lower aspect of the wound where there is a large liquefied area that probes right down TANYIKA, BARROS ([id number]) to her calcaneus. This cultured Proteus last week I have her on Augmentin and doxycycline 1. I think this is going to need a course of IV antibiotics and I will call dialysis. X-ray I did last week was negative, I think she is going to need an MRI 02/04/16; right heel MRI as before Saturday. Receiving I believe IV Rocephin at dialysis 02/11/16; as it turns out the patient could not have a MRI as she has a bladder stimulator in place even though it is not currently in use since the beginning of this year. Although she has an allergy to IV contrast she apparently has done well with premedication so we will have to go for a CT scan with contrast. In the meantime she has had a fall now has a large skin tear on her left upper arm. She went to the ER and they suggested Tegaderm over topical antibiotics 03/03/16 currently patient returns after having been hospitalized for 2 weeks and subsequently transferred to [facility] nursing facility. She actually seems to be doing excellent compared to even when we last saw her according to our nursing staff. Both patient  and her daughter are extremely encouraged at how well she is presenting at this point in time. Overall the biggest issue is still the right Achilles area which is being managed at this point in time by Dr. Wyn Quaker. Patient is currently utilizing a wound VAC to that region. 03/17/16; patient is at [facility] nursing home still. Using Aquacel Ag to the wounds on her bilateral feet and still Prisma to the small open area on her abdomen. 03/31/16 at this point in time patient has been tolerating the dressing changes  currently. She fortunately has no worsening of her symptoms although she tells me that the nurse who is caring for her at [facility] nursing facility decided that nothing was needed in regard to the lower abdominal wound from a dressing standpoint at this time. she is really not having significant discomfort or pain at this point she continues to have some tunneling in the proximal Achilles wound region. 04/07/16 patient continues to do well on evaluation today. Even the Achilles wound which has been the most tender is not giving her as much trouble. Unfortunately the PolyMem dressings that we utilize last week really did not seem to benefit her in particular. Obviously we will discontinue that at this point in time today. 04-15-16:Ms. Montemayor is accompanied today by her daughter. She is still residing in an SNF undergoing dialysis, continues to receive antibiotics during dialysis as prescribed by Dr. Sampson Goon of infectious disease. She has a follow-up appointment with Dr. Sampson Goon on 04-17-16 to discuss the continuation of these antibiotics. Ms. Azad denies any pain to any of the 3 remaining wounds she does admit to changing to Darco surgical shoes for safety while ambulating with physical therapy. She denies any falls since her last appointment here although she and her daughter do admit to increased tremors of unclear etiology since her last appointment. She has tolerated the dressing changes that were prescribed last week. 04-22-16 Ms. Bosher presents today accompanied by her daughter for evaluation of her diabetic foot ulcers. She is still residing in an SNF, she continues dialysis 3 times weekly. She'll follow up with Dr. Sampson Goon last Friday, and at that appointment IV antibiotics were discontinued. According to Ms. Galicia and her daughter if there is any regression of her wounds Dr. Sampson Goon will consider re-starting the antibiotics. Ms. Roh daughter states that since the  discontinuation of antibiotic therapy her "twitches" have resolved. 04/29/16; I have not seen this patient in quite some time however she is completed triple antibiotic therapy given at dialysis for osteomyelitis as prescribed by infectious disease. She is still being followed by Dr. dew of vascular surgery. We are using Hydrofera Blue to the surface of these wounds. She currently has 2 open areas a substantial area over her Achilles area although this is a lot better than the last time I saw this. She also has a small wound superiorly from this wound which has a superior probing depth of 2 cm. 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 My biopsy did not prove this nevertheless I think this is the correct clinical diagnosis. We are MANON, BANBURY ([id number]) now following her for an area on the right Achilles part of her heel. This is not any different from last week. She also has a small tunneling area just  above this. And unfortunately this week there is been a reopening of an area where her right fourth toe was previously amputated. She is completed antibiotics 05/13/16; she has a new reopening on the mid abdominal wound in the same site is previously. This is a small open area. Apligraf #1 today to the areas on the right heel o2 still an open area in the fourth toe amputation site Electronic Signature(s) Signed: 05/13/2016 4:39:00 PM By: Baltazar Najjar MD Entered By: Baltazar Najjar on 05/13/2016 11:46:13 [name] ([id number]) -------------------------------------------------------------------------------- Physical Exam Details Patient Name: [name] Date of Service: 05/13/2016 8:45 AM Medical Record Patient Account Number:  [id number] Number: Treating RN: Phillis Haggis 1933/10/29 (82 y.o. Other Clinician: Date of Birth/Sex: Female) Treating Natasha Burda Primary Care Physician/Extender: Rex Kras, Heidi Physician: Referring Physician: Charolotte Capuchin in Treatment: 21 Constitutional Sitting or standing Blood Pressure is within target range for patient.. Pulse regular and within target range for patient.Marland Kitchen Respirations regular, non-labored and within target range.. Temperature is normal and within the target range for the patient.. Patient's appearance is neat and clean. Appears in no acute distress. Well nourished and well developed.. Notes Wound exam; not too much difference from last week over the Achilles and the small probing wound above it. Both of these dressed with an Apligraf #1 today. The amputation site of the fourth toe also was opened last week although this looks satisfactory today. Finally a small open area on her abdomen hopefully this is not the start of a progression. At one point this was a large wound felt secondary to calciphylaxis Electronic Signature(s) Signed: 05/13/2016 4:39:00 PM By: Baltazar Najjar MD Entered By: Baltazar Najjar on 05/13/2016 11:48:28 [name] ([id number]) -------------------------------------------------------------------------------- Physician Orders Details Patient Name: [name] Date of Service: 05/13/2016 8:45 AM Medical Record Patient Account Number: [id number] Number: Treating RN: Phillis Haggis Oct 19, 1933 (82 y.o. Other Clinician: Date of Birth/Sex: Female) Treating Davetta Olliff Primary Care Physician/Extender: Rex Kras, Heidi Physician: Referring Physician: Charolotte Capuchin in Treatment: 13 Verbal / Phone Orders: Yes Clinician: Ashok Cordia, Debi Read Back and Verified: Yes Diagnosis Coding Wound Cleansing Wound #14 Right Amputation Site - Digit o Clean wound with Normal Saline. Wound #17  Right,Proximal Achilles o Clean wound with Normal Saline. Wound #18 Right Abdomen - Lower Quadrant o Clean wound with Normal Saline. Wound #9 Right,Distal Achilles o Clean wound with Normal Saline. Anesthetic Wound #14 Right Amputation Site - Digit o Topical Lidocaine 4% cream applied to wound bed prior to debridement - all wounds for clinic use only Wound #17 Right,Proximal Achilles o Topical Lidocaine 4% cream applied to wound bed prior to debridement - all wounds for clinic use only Wound #18 Right Abdomen - Lower Quadrant o Topical Lidocaine 4% cream applied to wound bed prior to debridement - all wounds for clinic use only Wound #9 Right,Distal Achilles o Topical Lidocaine 4% cream applied to wound bed prior to debridement - all wounds for clinic use only Skin Barriers/Peri-Wound Care Wound #14 Right Amputation Site - Digit o Skin Prep Wound #18 Right Abdomen - Lower PEBBLE, BOTKIN ([id number]) o Skin Prep Primary Wound Dressing Wound #14 Right Amputation Site - Digit o Hydrafera Blue - Hydrafera Blue Ready Transfer pack light into wound with enough out so you can gently pull it out Wound #17 Right,Proximal Achilles o Other: - Apligraph skin substitute Wound #18 Right Abdomen - Lower Quadrant o Aquacel Ag - Silver Alginate Wound #9 Right,Distal Achilles o  Other: - Apligraph skin substitute Secondary Dressing Wound #14 Right Amputation Site - Digit o Dry Gauze o Other - band-aide Wound #17 Right,Proximal Achilles o ABD pad o Dry Gauze o Conform/Kerlix o XtraSorb o Other - Charcoal, Coban Wound #18 Right Abdomen - Lower Quadrant o Boardered Foam Dressing Wound #9 Right,Distal Achilles o ABD pad o Dry Gauze o Conform/Kerlix o XtraSorb o Other - Charcoal, Coban Dressing Change Frequency Wound #14 Right Amputation Site - Digit o Change dressing every other day. Wound #17 Right,Proximal  Achilles o Change dressing every week - Only at the wound care clinic Wound #18 Right Abdomen - Lower Quadrant o Change dressing every other day. ANDILYN, BETTCHER ([id number]) Wound #9 Right,Distal Achilles o Change dressing every week - Only at the wound care clinic Follow-up Appointments Wound #14 Right Amputation Site - Digit o Return Appointment in 1 week. - nurse visit Wound #17 Right,Proximal Achilles o Return Appointment in 1 week. - nurse visit Wound #18 Right Abdomen - Lower Quadrant o Return Appointment in 1 week. - nurse visit Wound #9 Right,Distal Achilles o Return Appointment in 1 week. - nurse visit Edema Control Wound #14 Right Amputation Site - Digit o Elevate legs to the level of the heart and pump ankles as often as possible Wound #17 Right,Proximal Achilles o Elevate legs to the level of the heart and pump ankles as often as possible Wound #9 Right,Distal Achilles o Elevate legs to the level of the heart and pump ankles as often as possible Off-Loading Wound #14 Right Amputation Site - Digit o Other: - [facility] heels SNF- Please provide a heel offloading boot for HS. Wound #9 Right,Distal Achilles o Other: - Float heels SNF- Please provide a heel offloading boot for HS. Electronic Signature(s) Signed: 05/26/2016 3:42:59 PM By: Baltazar Najjar MD Signed: 06/02/2016 2:01:11 PM By: Alejandro Mulling Previous Signature: 05/13/2016 1:16:38 PM Version By: Alejandro Mulling Previous Signature: 05/13/2016 4:39:00 PM Version By: Baltazar Najjar MD Entered By: Alejandro Mulling on 05/20/2016 14:07:20 [name] ([id number]) -------------------------------------------------------------------------------- Problem List Details Patient Name: KRISSIE, MERRICK. Date of Service: 05/13/2016 8:45 AM Medical Record Patient Account Number: [id number] Number: Treating RN: Phillis Haggis 1934-01-05 (82 y.o. Other Clinician: Date of  Birth/Sex: Female) Treating Tanina Barb Primary Care Physician/Extender: Rex Kras, Heidi Physician: Referring Physician: Charolotte Capuchin in Treatment: 21 Active Problems ICD-10 Encounter Code Description Active Date Diagnosis E11.621 Type 2 diabetes mellitus with foot ulcer 12/17/2015 Yes L97.512 Non-pressure chronic ulcer of other part of right foot with 03/31/2016 Yes fat layer exposed E11.51 Type 2 diabetes mellitus with diabetic peripheral 12/17/2015 Yes angiopathy without gangrene S31.104A Unspecified open wound of abdominal wall, left lower 12/17/2015 Yes quadrant without penetration into peritoneal cavity, initial encounter S31.103D Unspecified open wound of abdominal wall, right lower 04/15/2016 Yes quadrant without penetration into peritoneal cavity, subsequent encounter Inactive Problems ICD-10 Code Description Active Date Inactive Date M86.671 Other chronic osteomyelitis, right ankle and foot 04/15/2016 04/15/2016 Resolved Problems CHANA, LINDSTROM ([id number]) Electronic Signature(s) Signed: 05/13/2016 4:39:00 PM By: Baltazar Najjar MD Entered By: Baltazar Najjar on 05/13/2016 11:40:25 [name] ([id number]) -------------------------------------------------------------------------------- Progress Note Details Patient Name: [name] Date of Service: 05/13/2016 8:45 AM Medical Record Patient Account Number: [id number] Number: Treating RN: Phillis Haggis 1934-02-13 (82 y.o. Other Clinician: Date of Birth/Sex: Female) Treating Naliya Gish Primary Care Physician/Extender: Rex Kras, Heidi Physician: Referring Physician: Charolotte Capuchin in Treatment: 21 Subjective Chief Complaint Information obtained from Patient Ms. Schweikert presents today  for follow-up evaluation of her diabetic foot ulcers and abdominal wound. History of Present Illness (HPI) The following HPI elements were documented for the patient's  wound: Location: right posterior heel, right Achilles, right lower quadrant abdomen, right fourth toe amp site Quality: denies pain to any wound Severity: not applicable Timing: denies pain 82 year old patient who is known to be diabetic, was referred to [place] by Dr. Gavin Potters for a right heel ulceration which she's had for a while. She was recently in hospital for a pneumonia and at that time and got delirious and was disoriented and sometime during this time developed a stage II ulcer on her right heel. Her past medical history is significant for bilateral pneumonia which was treated with injectable antibiotics and then to oral Levaquin which he has completed. She also has acute on chronic diastolic CHF, acute on chronic respiratory failure, end-stage renal disease on hemodialysis, atrial fibrillation, recent stroke, diabetes mellitus. The patient and her son are poor historians but from what I understand she was admitted to the hospital with an acute vascular compromise of her right lower extremity and Dr. Wyn Quaker has done a surgical procedure and we are trying to obtain these notes. There are also some vascular workup done and we will try and obtain these notes. the injury to the left lower quadrant of abdomen and the suprapubic area have been there due to a bruise and have been there for several months and no intervention has been done. 10/11/2015 -- on review of the electronics records it was noted that the patient was admitted to the hospital on 09/14/2015 with peripheral vascular disease with claudication, end-stage renal disease, pressure ulcer, chronic atrial fibrillation. She was seen by Dr. Wyn Quaker who did her right lower extremity angiogram , angioplasty of the right anterior tibial artery and thrombolysis with TPA of the right popliteal artery, and thrombectomy. She was seen by Dr. Wyn Quaker during this past week and he was pleased with the progress. He did say that if he took her to the operating  room for any procedure he would debride the abdominal wound under anesthesia. She was also seen by Dr. Ether Griffins the podiatrist who thought that she may lose her right fourth toe at some stage may need an amputation of this. CANIYA, TAGLE ([id number]) 10/21/2015 --patient known to Dr. Wyn Quaker and his last office visit from 10/04/2015 has been reviewed. 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 She also had a persistent ulceration of the right heel and markedly this area and a right third and fourth toe and a small scab on the calf but these are dry and seemed to be improving. Patient also has a left carotid endarterectomy and multiple interventions to a right brachiocephalic AV fistula. After the visit he had recommended noninvasive studies to recheck her revascularization. He was off the impression that she would likely lose the right fourth toe and the third toe was likely to heal. He was concerned about underlying muscle necrosis on her right heel and midfoot. 11/01/2015 -- an echo done in January of this year showed her left ventricular ejection fraction to be about 50-55%. The patient was seen by the PA and Dr. Driscilla Grammes office and the plan was to take her to the operating room soon to have a debridement under anesthesia for the abdominal wall wound, the Achilles tendon on the right leg and amputation of the right fourth  toe. The daughter and the patient do not feel that they would be able to undergo hyperbaric oxygen therapy 5 days a week for 6 weeks. 12/17/15; this is a medically complex woman who I note was recently in this clinic however I was not involved with her care. She returns today with multiple wounds; a) she has a wound in the mid abdomen that is been there since March of this year. I note that she is been to the overall for debridement recently. The exact etiology  of this wound is not really clear b) left lower quadrant abdominal wound had some sanguinous drainage when she came in here. The patient fell in January and thinks this may have been secondary to a hematoma. c): The patient has 3 wounds on her right leg including a small wound on the right mid calf, a large area over the Achilles which currently has a wound VAC for the last 6 weeks, also a smaller wound on the distal part of the right heel. As far as I understand most of these wounds are currently been dressed with's calcium alginate. According to her daughter the Achilles wound under the wound VAC is doing well d) the patient is had an amputation of her left fifth toe in January and the right fourth toe 6 weeks ago secondary to diabetic PAD e) the patient has chronic renal failure on dialysis for the last 2 years secondary to type 2 diabetes on insulin. The daughter's knowledge there is been no biopsy of the abdominal wounds given their current appearance and lack of undefined etiology at have to wonder about calciphylaxis. 12/18/15:Addendum; I have reviewed [facility] I can see no relevant x-rays of the right heel. I note her arteriogram and revascularization of her right lower extremity in April 2017. She had debridement of both abdominal wounds and the right heel and Achilles wound on 11/07/15 at which time she had a right fourth toe ray amputation. The abdominal wounds were debridement again on 6/29. I do not see any relevant pathology of these abdominal wounds 12/24/15; culture I did of the drainage from the midline abdominal wound last week showed both Proteus and ampicillin sensitive enterococcus. I've given her a course of Augmentin adjusted on dialysis days. She has no specific complaints today. Been using Santyl to the abdominal wounds in the right leg wound and the wound VAC on the right Achilles which was initially prescribed by Dr. dew 12/31/15; I have done two punch biopsies of  the large midline abdominal. 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 May have been a trauma component of the one on the left lower quadrant however the midline wound had no such history. She has a large area on the right Achilles heel with a wound VAC prescribed by Dr. dew. A small wound on the right anterior leg.Marland Kitchen UNFORTUNATELY she has 2 new wounds today. One on the left heel which is probably a pressure area. As well her previous amputation site of her right fourth toe has dehisced and now has a small wound with significant depth at the amputation site. SUNJAI, LEVANDOSKI ([id number]) 01/14/2016 -- she returns after 2 weeks and had had a punch biopsy of abdominal wound done the last visit -- had a biopsy of her midline abdominal wound done and the Pathology diagnosis is that of ulceration, necrosis and inflammation and negative for dysplasia and malignancy. 01/21/16. 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 In the  meantime she has new wounds of the left heel the right fourth toe amputation site is opened up. The back is stopped to the right heel area. 01/28/16; the abdominal wounds continued to improve. The extensive wound on her right Achilles also looks stable except for the lower aspect of the wound where there is a large liquefied area that probes right down to her calcaneus. This cultured Proteus last week I have her on Augmentin and doxycycline 1. I think this is going to need a course of IV antibiotics and I will call dialysis. X-ray I did last week was negative, I think she is going to need an MRI 02/04/16; right heel MRI as before Saturday. Receiving I believe IV Rocephin at dialysis 02/11/16; as it turns out the patient could not have a MRI as she has a bladder stimulator in place even though it is not currently in use since the beginning of this year. Although she has an allergy to IV contrast she apparently has done well  with premedication so we will have to go for a CT scan with contrast. In the meantime she has had a fall now has a large skin tear on her left upper arm. She went to the ER and they suggested Tegaderm over topical antibiotics 03/03/16 currently patient returns after having been hospitalized for 2 weeks and subsequently [facility]. She actually seems to be doing excellent compared to even when we last saw her according to our nursing staff. Both patient and her daughter are extremely encouraged at how well she is presenting at this point in time. Overall the biggest issue is still the right Achilles area which is being managed at this point in time by Dr. Wyn Quakerew. Patient is currently utilizing a wound VAC to that region. 03/17/16; patient is at [facility] still. Using Aquacel Ag to the wounds on her bilateral feet and still Prisma to the small open area on her abdomen. 03/31/16 at this point in time patient has been tolerating the dressing changes currently. She fortunately has no worsening of her symptoms although she tells me that the nurse who is caring for her [facility] decided that nothing was needed in regard to the lower abdominal wound from a dressing standpoint at this time. she is really not having significant discomfort or pain at this point she continues to have some tunneling in the proximal Achilles wound region. 04/07/16 patient continues to do well on evaluation today. Even the Achilles wound which has been the most tender is not giving her as much trouble. Unfortunately the PolyMem dressings that we utilize last week really did not seem to benefit her in particular. Obviously we will discontinue that at this point in time today. 04-15-16:Ms. Cedillos is accompanied today by her daughter. She is still residing in an SNF undergoing dialysis, continues to receive antibiotics during dialysis as prescribed by Dr. Sampson GoonFitzgerald of  infectious disease. She has a follow-up appointment with Dr. Sampson GoonFitzgerald on 04-17-16 to discuss the continuation of these antibiotics. Ms. Abbe AmsterdamHopkins denies any pain to any of the 3 remaining wounds she does admit to changing to [employer] surgical shoes for safety while ambulating with physical therapy. She denies any falls since her last appointment here although she and her daughter do admit to increased tremors of unclear etiology since her last appointment. She has tolerated the dressing changes that were prescribed last week. 04-22-16 Ms. Martus presents today accompanied by her daughter for evaluation of  her diabetic foot ulcers. She is still residing in an SNF, she continues dialysis 3 times weekly. She'll follow up with Dr. Sampson Goon last Friday, and at that appointment IV antibiotics were discontinued. According to Ms. Pacifico and her daughter if there is any regression of her wounds Dr. Sampson Goon will consider re-starting the antibiotics. Ms. Golightly daughter states that since the discontinuation of antibiotic therapy her "twitches" have resolved. 04/29/16; I have not seen this patient in quite some time however she is completed triple antibiotic therapy NANI, INGRAM ([id number]) given at dialysis for osteomyelitis as prescribed by infectious disease. She is still being followed by Dr. dew of vascular surgery. We are using Hydrofera Blue to the surface of these wounds. She currently has 2 open areas a substantial area over her Achilles area although this is a lot better than the last time I saw this. She also has a small wound superiorly from this wound which has a superior probing depth of 2 cm. 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 My biopsy did not prove  this nevertheless I think this is the correct clinical diagnosis. We are now following her for an area on the right Achilles part of her heel. This is not any different from last week. She also has a small tunneling area just above this. And unfortunately this week there is been a reopening of an area where her right fourth toe was previously amputated. She is completed antibiotics 05/13/16; she has a new reopening on the mid abdominal wound in the same site is previously. This is a small open area. Apligraf #1 today to the areas on the right heel o2 still an open area in the fourth toe amputation site Objective Constitutional Sitting or standing Blood Pressure is within target range for patient.. Pulse regular and within target range for patient.Marland Kitchen Respirations regular, non-labored and within target range.. Temperature is normal and within the target range for the patient.. Patient's appearance is neat and clean. Appears in no acute distress. Well nourished and well developed.. Vitals Time Taken: 9:00 AM, Height: 65 in, Weight: 160 lbs, BMI: 26.6, Pulse: 75 bpm, Respiratory Rate: 16 breaths/min, Blood Pressure: 115/59 mmHg. General Notes: Wound exam; not too much difference from last week over the Achilles and the small probing wound above it. Both of these dressed with an Apligraf #1 today. The amputation site of the fourth toe also was opened last week although this looks satisfactory today. Finally a small open area on her abdomen hopefully this is not the start of a progression. At one point this was a large wound felt secondary to calciphylaxis Integumentary (Hair, Skin) Wound #14 status is Open. Original cause of wound was Surgical Injury. The wound is located on the Right Amputation Site - Digit. The wound measures 0.3cm length x 0.2cm width x 0.3cm depth; 0.047cm^2 area and 0.014cm^3 volume. The wound is limited to skin breakdown. There is no tunneling or undermining noted. There is  a large amount of serous drainage noted. The wound margin is flat and intact. There is small (1-33%) pink granulation within the wound bed. There is a large (67-100%) amount of necrotic tissue within the wound bed including Adherent Slough. The periwound skin appearance exhibited: Moist. The periwound skin appearance did not exhibit: Callus, Crepitus, Excoriation, Fluctuance,  Friable, Induration, Localized Edema, Rash, Scarring, Dry/Scaly, Maceration, Atrophie Blanche, Cyanosis, Ecchymosis, Hemosiderin Staining, Mottled, Pallor, Rubor, Erythema. Periwound temperature was noted as No Abnormality. The periwound has tenderness on palpation. DARYA, BIGLER ([id number]) Wound #17 status is Open. Original cause of wound was Gradually Appeared. The wound is located on the Right,Proximal Achilles. The wound measures 1.2cm length x 0.2cm width x 0.4cm depth; 0.188cm^2 area and 0.075cm^3 volume. The wound is limited to skin breakdown. There is no undermining noted, however, there is tunneling at 12:00 with a maximum distance of 2.9cm. There is a large amount of serosanguineous drainage noted. The wound margin is distinct with the outline attached to the wound base. There is large (67-100%) red granulation within the wound bed. There is a small (1-33%) amount of necrotic tissue within the wound bed including Adherent Slough. Periwound temperature was noted as No Abnormality. The periwound has tenderness on palpation. Wound #18 status is Open. Original cause of wound was Gradually Appeared. The wound is located on the Right Abdomen - Lower Quadrant. The wound measures 0.2cm length x 1.2cm width x 0.1cm depth; 0.188cm^2 area and 0.019cm^3 volume. The wound is limited to skin breakdown. There is no tunneling or undermining noted. There is a medium amount of serous drainage noted. The wound margin is distinct with the outline attached to the wound base. There is medium (34-66%) red granulation within the  wound bed. There is a medium (34-66%) amount of necrotic tissue within the wound bed including Adherent Slough. The periwound skin appearance exhibited: Moist. Periwound temperature was noted as No Abnormality. The periwound has tenderness on palpation. Wound #9 status is Open. Original cause of wound was Gradually Appeared. The wound is located on the Right,Distal Achilles. The wound measures 5.1cm length x 2cm width x 0.2cm depth; 8.011cm^2 area and 1.602cm^3 volume. The wound is limited to skin breakdown. There is no tunneling or undermining noted. There is a large amount of serosanguineous drainage noted. The wound margin is distinct with the outline attached to the wound base. There is medium (34-66%) pink granulation within the wound bed. There is a medium (34-66%) amount of necrotic tissue within the wound bed including Adherent Slough. The periwound skin appearance exhibited: Dry/Scaly, Moist. Periwound temperature was noted as No Abnormality. The periwound has tenderness on palpation. Assessment Active Problems ICD-10 E11.621 - Type 2 diabetes mellitus with foot ulcer L97.512 - Non-pressure chronic ulcer of other part of right foot with fat layer exposed E11.51 - Type 2 diabetes mellitus with diabetic peripheral angiopathy without gangrene S31.104A - Unspecified open wound of abdominal wall, left lower quadrant without penetration into peritoneal cavity, initial encounter S31.103D - Unspecified open wound of abdominal wall, right lower quadrant without penetration into peritoneal cavity, subsequent encounter Procedures ANGALENA, COUSINEAU ([id number]) Wound #9 Wound #9 is a Diabetic Wound/Ulcer of the Lower Extremity located on the Right,Distal Achilles. A skin graft procedure using a bioengineered skin substitute/cellular or tissue based product was performed by Maxwell Caul, MD. Apligraf was applied and secured with Steri-Strips. 10.2 sq cm of product was utilized and 33.8 sq  cm was wasted due to WOUND SIZE. Post Application, MEPITEL was applied. A Time Out was conducted at 09:35, prior to the start of the procedure. The procedure was tolerated well with a pain level of 0 throughout and a pain level of 0 following the procedure. Post procedure Diagnosis Wound #9: Same as Pre-Procedure . Plan Wound Cleansing: Wound #14 Right Amputation Site - Digit: Clean wound with  Normal Saline. Wound #17 Right,Proximal Achilles: Clean wound with Normal Saline. Wound #18 Right Abdomen - Lower Quadrant: Clean wound with Normal Saline. Wound #9 Right,Distal Achilles: Clean wound with Normal Saline. Anesthetic: Wound #14 Right Amputation Site - Digit: Topical Lidocaine 4% cream applied to wound bed prior to debridement - all wounds for clinic use only Wound #17 Right,Proximal Achilles: Topical Lidocaine 4% cream applied to wound bed prior to debridement - all wounds for clinic use only Wound #18 Right Abdomen - Lower Quadrant: Topical Lidocaine 4% cream applied to wound bed prior to debridement - all wounds for clinic use only Wound #9 Right,Distal Achilles: Topical Lidocaine 4% cream applied to wound bed prior to debridement - all wounds for clinic use only Skin Barriers/Peri-Wound Care: Wound #14 Right Amputation Site - Digit: Skin Prep Wound #18 Right Abdomen - Lower Quadrant: Skin Prep Primary Wound Dressing: Wound #14 Right Amputation Site - Digit: Hydrafera Blue - Hydrafera Blue Ready Transfer pack light into wound with enough out so you can gently pull it out Wound #17 Right,Proximal Achilles: Other: - Apligraph skin substitute Wound #18 Right Abdomen - Lower Quadrant: Aquacel Ag - Silver Alginate Wound #9 Right,Distal Achilles: Other: - Apligraph skin substitute MAYBELLE, DEPAOLI ([id number]) Secondary Dressing: Wound #14 Right Amputation Site - Digit: Dry Gauze Other - band-aide Wound #17 Right,Proximal Achilles: ABD pad Dry  Gauze Conform/Kerlix XtraSorb Other - Charcoal, Coban Wound #18 Right Abdomen - Lower Quadrant: Boardered Foam Dressing Wound #9 Right,Distal Achilles: ABD pad Dry Gauze Conform/Kerlix XtraSorb Other - Charcoal, Coban Dressing Change Frequency: Wound #14 Right Amputation Site - Digit: Change dressing every other day. Wound #17 Right,Proximal Achilles: Change dressing every week - Only at the wound care clinic Wound #18 Right Abdomen - Lower Quadrant: Change dressing every other day. Wound #9 Right,Distal Achilles: Change dressing every week - Only at the wound care clinic Follow-up Appointments: Wound #14 Right Amputation Site - Digit: Return Appointment in 1 week. Return Appointment in 1 week. Wound #17 Right,Proximal Achilles: Return Appointment in 1 week. Wound #18 Right Abdomen - Lower Quadrant: Return Appointment in 1 week. Wound #9 Right,Distal Achilles: Return Appointment in 1 week. Edema Control: Wound #14 Right Amputation Site - Digit: Elevate legs to the level of the heart and pump ankles as often as possible Wound #17 Right,Proximal Achilles: Elevate legs to the level of the heart and pump ankles as often as possible Wound #9 Right,Distal Achilles: Elevate legs to the level of the heart and pump ankles as often as possible Off-Loading: Wound #14 Right Amputation Site - Digit: Other: - Float heels SNF- Please provide a heel offloading boot for HS. Wound #9 Right,Distal Achilles: Other: - Float heels SNF- Please provide a heel offloading boot for HS. SHAMIR, SEDLAR ([id number]) 1 Apligraf to th two heel wounds 2 silver alginate to the small abdominal wound 3hydrofera to the right toe amputation site Electronic Signature(s) Signed: 05/13/2016 4:39:00 PM By: Baltazar Najjar MD Entered By: Baltazar Najjar on 05/13/2016 11:51:31 Crenshaw, Vinetta Bergamo ([id number]) -------------------------------------------------------------------------------- SuperBill  Details Patient Name: [name]. Date of Service: 05/13/2016 Medical Record Patient Account Number: [id number] Number: Treating RN: Phillis Haggis [birth year] (82 y.o. Other Clinician: Date of Birth/Sex: Female) Treating Malcolm Quast Primary Care Physician/Extender: Rex Kras, Heidi Physician: Weeks in Treatment: 21 Referring Physician: [name] Diagnosis Coding ICD-10 Codes Code Description E11.621 Type 2 diabetes mellitus with foot ulcer L97.512 Non-pressure chronic ulcer of other part of right foot with fat layer exposed E11.51  Type 2 diabetes mellitus with diabetic peripheral angiopathy without gangrene Unspecified open wound of abdominal wall, left lower quadrant without penetration into S31.104A peritoneal cavity, initial encounter Unspecified open wound of abdominal wall, right lower quadrant without penetration into S31.103D peritoneal cavity, subsequent encounter Facility Procedures CPT4 Code Description: [id number] (Facility Use Only) Apligraf 44 SQ CM Modifier: Quantity: 44 CPT4 Code Description: [id number] 15271 - SKIN SUB GRAFT TRNK/ARM/LEG ICD-10 Description Diagnosis E11.621 Type 2 diabetes mellitus with foot ulcer L97.512 Non-pressure chronic ulcer of other part of right foo Modifier: t with fat la Quantity: 1 yer exposed Physician Procedures CPT4 Code Description: [id number] 15271 - WC PHYS SKIN SUB GRAFT TRNK/ARM/LEG ICD-10 Description Diagnosis E11.621 Type 2 diabetes mellitus with foot ulcer L97.512 Non-pressure chronic ulcer of other part of right foot Modifier: with fat lay Quantity: 1 er exposed Electronic Signature(s) Signed: 05/13/2016 4:39:00 PM By: Baltazar Najjar MD Entered By: Baltazar Najjar on 05/13/2016 11:52:17

## 2016-05-14 NOTE — OR Nursing (Signed)
meds (last  dose) [facility] [phone]

## 2016-05-14 NOTE — OR Nursing (Signed)
Ok to cancel SCD order per Dr.Dew verbal due to status of pt's leg ulcers

## 2016-05-14 NOTE — Anesthesia Preprocedure Evaluation (Signed)
Anesthesia Evaluation  Patient identified by MRN, date of birth, ID band Patient awake    Reviewed: Allergy & Precautions, H&P , NPO status , Patient's Chart, lab work & pertinent test results, reviewed documented beta blocker date and time   Airway Mallampati: II  TM Distance: >3 FB Neck ROM: full    Dental  (+) Teeth Intact, Poor Dentition, Missing   Pulmonary neg pulmonary ROS, shortness of breath and with exertion, pneumonia, resolved,    Pulmonary exam normal        Cardiovascular Exercise Tolerance: Good hypertension, + Peripheral Vascular Disease and +CHF  (-) Orthopnea and (-) PND negative cardio ROS Normal cardiovascular exam+ dysrhythmias  Rate:Normal     Neuro/Psych CVA negative neurological ROS  negative psych ROS   GI/Hepatic negative GI ROS, Neg liver ROS, GERD  Medicated,  Endo/Other  negative endocrine ROSdiabetes  Renal/GU ESRFRenal diseasenegative Renal ROS  negative genitourinary   Musculoskeletal   Abdominal   Peds  Hematology negative hematology ROS (+) anemia ,   Anesthesia Other Findings   Reproductive/Obstetrics negative OB ROS                             Anesthesia Physical Anesthesia Plan  ASA: IV  Anesthesia Plan: General LMA   Post-op Pain Management:    Induction:   Airway Management Planned:   Additional Equipment:   Intra-op Plan:   Post-operative Plan:   Informed Consent: I have reviewed the patients History and Physical, chart, labs and discussed the procedure including the risks, benefits and alternatives for the proposed anesthesia with the patient or authorized representative who has indicated his/her understanding and acceptance.     Plan Discussed with: CRNA  Anesthesia Plan Comments:         Anesthesia Quick Evaluation

## 2016-05-14 NOTE — H&P (Signed)
[facility] History & Physical Update  The patient was interviewed and re-examined.  The patient's previous History and Physical has been reviewed and is unchanged.  There is no change in the plan of care. We plan to proceed with the scheduled procedure.  Festus BarrenJason Ariday Brinker, MD  05/14/2016, 2:18 PM

## 2016-05-14 NOTE — Op Note (Signed)
[facility]   OPERATIVE NOTE   PROCEDURE: 1. Jump graft revision of right arm brachiocephalic arteriovenous fistula with 7 mm Artegraft 2. 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 aneurysmal degeneration of arteriovenous fistula  2. ESRD  POST-OPERATIVE DIAGNOSIS: same as above   SURGEON: Natalie BarrenJason Gailyn Crook, MD  ASSISTANT(S): Natalie DelKim Stegmayer, PA-C  ANESTHESIA: General  ESTIMATED BLOOD LOSS: 25 cc  FINDING(S): 1. Right brachiocephalic AV fistula aneurysm 2. Good, palpable thrill at end of the case  SPECIMEN(S):  None  INDICATIONS:   [name] is a 82 y.o. female who  presents with aneurysmal degeneration of right arm arteriovenous access.  In order to salvage the fistula and decrease the bleeding complication risks, I recommended a jump graft revision with ligation of the access.  Risk, benefits, and alternatives to access surgery were discussed.  The patient is aware the risks include but are not limited to: bleeding, infection, steal syndrome, nerve damage, ischemic monomelic neuropathy, loss of the access, need for additional procedures, death and stroke.  The patient agrees to proceed forward with the procedure.  DESCRIPTION: After obtaining full informed written consent, the patient was brought back to the operating room and placed supine upon the operating table.  The patient received IV antibiotics prior to induction.  After obtaining adequate anesthesia, the patient was prepped and draped in the standard fashion for the access procedure.  As incision was created near the arterial anastomosis prior to the aneurysmal segment.  The access was encircled with vessel loops and prepared for control.  I then created an incision in the proximal arm beyond the aneurysmal segment and encircled the access there for control with a vessel loop.  I then used the tunneller and tunnelled between the two incisions around the old  access.  I brought a 7 mm Artegraft through the tunneller making sure to avoid twisting after marking for orientation.  The patient was then given 3000 units of intravenous heparin.  The access was then controlled and clamped and ligated distally with a silk suture ligature as well as a silk free tie to ligate the distal, aneurysmal fistula.  I prepared the end nearer the original arterial anastomosis for an anastomosis with the new Artegraft.  The anastomosis was created in an end to end fashion with two 6-0 Prolene sutures in the typical fashion.  I then flushed through the new graft and prepared this for the distal anastomosis.  The access was then divided and ligated again with a silk suture ligature and a silk free tie.  The distal end was then prepared for anastomosis with the new graft.  An anastomosis was then created with two 6-0 Prolene sutures in the usual fashion.  The graft was flushed and de-aired prior to release of control.  Patch sutures with 6-0 Prolene sutures were used as needed for control of bleeding.  Surgicel and Evicel topical hemostatic agents were placed and hemostasis was complete.  I then closed the wound with 3-0 Vicryl suture in the subcutaneous space and a 4-0 Monocryl suture was used to close the skin.  Dermabond was placed as a dressing.  The patient was then awakened from anesthesia and taken to the recovery room in stable condition having tolerated the procedure well.    COMPLICATIONS: none  CONDITION: stable  [name]  05/14/2016, 5:36 PM   This note was created with Dragon Medical transcription system. Any errors in dictation are purely unintentional.

## 2016-05-14 NOTE — Op Note (Signed)
OPERATIVE NOTE    PRE-OPERATIVE DIAGNOSIS: 1. ESRD 2. Need for surgical revision of current AVF  POST-OPERATIVE DIAGNOSIS: same as above  PROCEDURE: 1. Ultrasound guidance for vascular access to the left femoral  vein 2. Fluoroscopic guidance for placement of catheter 3. Placement of a 44 cm tip to cuff tunneled hemodialysis catheter via the left femoral vein  SURGEON: Festus BarrenJason Tyrrell Stephens, MD  ANESTHESIA:  general  ESTIMATED BLOOD LOSS: minimal  FINDING(S): 1.  Patent left femoral vein   SPECIMEN(S):  None  INDICATIONS:   Patient is a 82 y.o.female who presents with need for revision of her AVF and will need a permcath while this heals.  Risks and benefits are discussed and informed consent is obtained.    DESCRIPTION: After obtaining full informed written consent, the patient was brought back to the vascular suited. The patient received a general anesthetic, and this was done just before her surgical revision. The patient's left groin was sterilely prepped and draped and a sterile surgical field was created.  The left femoral vein was visualized with ultrasound and found to be patent. It was then accessed under direct ultrasound guidance and a permanent image was recorded. A wire was placed. After skin nick and dilatation, the peel-away sheath was placed over the wire. I then turned my attention to an area about 4-5 cm inferior and lateral to the access incision and a small counterincision was created.  I tunneled from the counter  incision to the access site. Using fluoroscopic guidance, a 44 centimeterer tip to cuff tunneled hemodialysis catheter was selected, and tunneled from the counter  incision to the access site. It was then placed through the peel-away sheath and the peel-away sheath was removed. Using fluoroscopic guidance the catheter tips were parked in the retrohepatic vena cava. The appropriate distal connectors were placed. It withdrew blood well and flushed easily with  heparinized saline and a concentrated heparin solution was then placed. It was secured to the leg  with 2 Prolene sutures. The access incision was closed single 4-0 Monocryl. A 4-0 Monocryl pursestring suture was placed around the exit site. Sterile dressings were placed. The patient tolerated the procedure well and was taken to the recovery room in stable condition.  COMPLICATIONS: None  CONDITION: Stable    Festus BarrenJason Shawnta Schlegel 05/14/2016 5:33 PM   This note was created with Dragon Medical transcription system. Any errors in dictation are purely unintentional.

## 2016-05-14 NOTE — Discharge Instructions (Signed)

## 2016-05-14 NOTE — Progress Notes (Signed)
[name], Eyana Y. ([id number]) Visit Report for 05/13/2016 Arrival Information Details Patient Name: [name], Natalie Y. Date of Service: 05/13/2016 8:45 AM Medical Record Patient Account Number: [id number] Number: Treating RN: Phillis Haggisinkerton, Debi 24-Jul-1933 (82 [name] Other Clinician: Date of Birth/Sex: Female) Treating ROBSON, MICHAEL Primary Care Physician: Rolm GalaGrandis, Heidi Physician/Extender: G Referring Physician: Charolotte CapuchinGrandis, Heidi Weeks in Treatment: 21 Visit Information History Since Last Visit All ordered tests and consults were completed: No Patient Arrived: Wheel Chair Added or deleted any medications: No Arrival Time: 08:56 Any new allergies or adverse reactions: No Accompanied By: self Had a fall or experienced change in No Transfer Assistance: EasyPivot Patient activities of daily living that may affect Lift risk of falls: Patient Identification Verified: Yes Signs or symptoms of abuse/neglect since last No Secondary Verification Process Yes visito Completed: Hospitalized since last visit: No Patient Requires Transmission- No Has Dressing in Place as Prescribed: Yes Based Precautions: Pain Present Now: No Patient Has Alerts: Yes Patient Alerts: Patient on Blood Thinner DMII Warfarin ABI Callaghan Bilateral NO BP RIGHT ARM Electronic Signature(s) Signed: 05/13/2016 1:16:38 PM By: Alejandro MullingPinkerton, Debra Entered By: Alejandro MullingPinkerton, Debra on 05/13/2016 08:58:38 [name], Natalie Y. ([id number]) -------------------------------------------------------------------------------- Encounter Discharge Information Details Patient Name: [name], Natalie Y. Date of Service: 05/13/2016 8:45 AM Medical Record Patient Account Number: [id number] Number: Treating RN: Phillis Haggisinkerton, Debi 24-Jul-1933 (82 [name] Other Clinician: Date of Birth/Sex: Female) Treating ROBSON, MICHAEL Primary Care Physician: Rolm GalaGrandis, Heidi Physician/Extender: G Referring Physician: Charolotte CapuchinGrandis, Heidi Weeks in  Treatment: 21 Encounter Discharge Information Items Discharge Pain Level: 0 Discharge Condition: Stable Ambulatory Status: Wheelchair Discharge Destination: Nursing Home Transportation: Private Auto Accompanied By: daughter Schedule Follow-up Appointment: Yes Medication Reconciliation completed and provided to Patient/Care Yes Airis Barbee: Provided on Clinical Summary of Care: 05/13/2016 Form Type [facility]ipient Paper [facility] Electronic Signature(s) Signed: 05/13/2016 1:16:38 PM By: Alejandro MullingPinkerton, Debra Previous Signature: 05/13/2016 10:06:40 AM Version By: Gwenlyn PerkingMoore, Shelia Entered By: Alejandro MullingPinkerton, Debra on 05/13/2016 10:07:35 [name], Ednamae Y. ([id number]) -------------------------------------------------------------------------------- Lower Extremity Assessment Details Patient Name: [name], Natalie Y. Date of Service: 05/13/2016 8:45 AM Medical Record Patient Account Number: [id number] Number: Treating RN: Phillis Haggisinkerton, Debi 24-Jul-1933 (82 [name] Other Clinician: Date of Birth/Sex: Female) Treating ROBSON, MICHAEL Primary Care Physician: Rolm GalaGrandis, Heidi Physician/Extender: G Referring Physician: Charolotte CapuchinGrandis, Heidi Weeks in Treatment: 21 Vascular Assessment Pulses: Posterior Tibial Dorsalis Pedis Palpable: [Right:No] Doppler: [Right:Monophasic] Extremity colors, hair growth, and conditions: Extremity Color: [Right:Hyperpigmented] Temperature of Extremity: [Right:Warm] Capillary Refill: [Right:< 3 seconds] Toe Nail Assessment Left: Right: Thick: No Discolored: No Deformed: No Improper Length and Hygiene: No Electronic Signature(s) Signed: 05/13/2016 1:16:38 PM By: Alejandro MullingPinkerton, Debra Entered By: Alejandro MullingPinkerton, Debra on 05/13/2016 09:02:26 [name], Natalie Y. ([id number]) -------------------------------------------------------------------------------- Multi Wound Chart Details Patient Name: [name], Jaretzy Y. Date of Service: 05/13/2016 8:45 AM Medical Record Patient Account Number:  [id number] Number: Treating RN: Phillis Haggisinkerton, Debi 24-Jul-1933 (82 [name] Other Clinician: Date of Birth/Sex: Female) Treating ROBSON, MICHAEL Primary Care Physician: Rolm GalaGrandis, Heidi Physician/Extender: G Referring Physician: Charolotte CapuchinGrandis, Heidi Weeks in Treatment: 21 Vital Signs Height(in): 65 Pulse(bpm): 75 Weight(lbs): 160 Blood Pressure 115/59 (mmHg): Body Mass Index(BMI): 27 Temperature(F): Respiratory Rate 16 (breaths/min): Photos: [14:No Photos] [17:No Photos] [18:No Photos] Wound Location: [14:Right Amputation Site - Digit] [17:Right Achilles - Proximal] [18:Right Abdomen - Lower Quadrant] Wounding Event: [14:Surgical Injury] [17:Gradually Appeared] [18:Gradually Appeared] Primary Etiology: [14:Open Surgical Wound] [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] [18:To be determined] Secondary Etiology: [14:N/A] [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] Comorbid History: [14:Arrhythmia, Congestive Heart Failure, Hypertension, Type II Diabetes] [17:Arrhythmia, Congestive Arrhythmia, Congestive Heart Failure,  Hypertension, Type II Diabetes] [18:Heart Failure, Hypertension, Type II Diabetes] Date Acquired: [14:11/11/2015] [17:06/03/2015] [18:05/13/2016] Weeks of Treatment: [14:19] [17:3] [18:0] Wound Status: [14:Open] [17:Open] [18:Open] Pending Amputation on No [17:No] [18:No] Presentation: Measurements L x W x D 0.3x0.2x0.3 [17:1.2x0.2x0.4] [18:0.2x1.2x0.1] (cm) Area (cm) : [14:0.047] [17:0.188] [18:0.188] Volume (cm) : [14:0.014] [17:0.075] [18:0.019] % Reduction in Area: [14:93.80%] [17:4.10%] [18:N/A] % Reduction in Volume: 81.30% [17:23.50%] [18:N/A] Position 1 (o'clock): [17:12] Maximum Distance 1 [17:2.9] (cm): Tunneling: [14:No] [17:Yes] [18:No] Classification: [17:Grade 1] [18:Partial Thickness] Full Thickness Without Exposed Support Structures Exudate Amount: Large Large Medium Exudate Type: Serous Serosanguineous Serous Exudate Color: amber red, brown  amber Wound Margin: Flat and Intact Distinct, outline attached Distinct, outline attached Granulation Amount: Small (1-33%) Large (67-100%) Medium (34-66%) Granulation Quality: Pink Red Red Necrotic Amount: Large (67-100%) Small (1-33%) Medium (34-66%) Exposed Structures: Fascia: No Fascia: No Fascia: No Fat: No Fat: No Fat: No Tendon: No Tendon: No Tendon: No Muscle: No Muscle: No Muscle: No Joint: No Joint: No Joint: No Bone: No Bone: No Bone: No Limited to Skin Limited to Skin Limited to Skin Breakdown Breakdown Breakdown Epithelialization: None None None Periwound Skin Texture: Edema: No No Abnormalities Noted No Abnormalities Noted Excoriation: No Induration: No Callus: No Crepitus: No Fluctuance: No Friable: No Rash: No Scarring: No Periwound Skin Moist: Yes No Abnormalities Noted Moist: Yes Moisture: Maceration: No Dry/Scaly: No Periwound Skin Color: Atrophie Blanche: No No Abnormalities Noted No Abnormalities Noted Cyanosis: No Ecchymosis: No Erythema: No Hemosiderin Staining: No Mottled: No Pallor: No Rubor: No Temperature: No Abnormality No Abnormality No Abnormality Tenderness on Yes Yes Yes Palpation: Wound Preparation: Ulcer Cleansing: Ulcer Cleansing: Ulcer Cleansing: Rinsed/Irrigated with Rinsed/Irrigated with Rinsed/Irrigated with Saline Saline Saline Topical Anesthetic Topical Anesthetic Topical Anesthetic Applied: Other: lidocaine Applied: Other: lidocaine Applied: Other: lidocaine 4% 4% 4% [name], Natalie Y. ([id number]) Wound Number: 9 N/A N/A Photos: No Photos N/A N/A Wound Location: Right Achilles - Distal N/A N/A Wounding Event: Gradually Appeared N/A N/A 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 N/A N/A Comorbid History: Arrhythmia, Congestive N/A N/A Heart Failure, Hypertension, Type II Diabetes Date Acquired: 06/02/2015 N/A N/A Weeks of Treatment:  21 N/A N/A Wound Status: Open N/A N/A Pending Amputation on Yes N/A N/A Presentation: Measurements L x W x D 5.1x2x0.2 N/A N/A (cm) Area (cm) : 8.011 N/A N/A Volume (cm) : 1.602 N/A N/A % Reduction in Area: 75.40% N/A N/A % Reduction in Volume: 83.60% N/A N/A Tunneling: No N/A N/A Classification: Grade 2 N/A N/A Exudate Amount: Large N/A N/A Exudate Type: Serosanguineous N/A N/A Exudate Color: red, brown N/A N/A Wound Margin: Distinct, outline attached N/A N/A Granulation Amount: Medium (34-66%) N/A N/A Granulation Quality: Pink N/A N/A Necrotic Amount: Medium (34-66%) N/A N/A Exposed Structures: Fascia: No N/A N/A Fat: No Tendon: No Muscle: No Joint: No Bone: No Limited to Skin Breakdown Epithelialization: None N/A N/A Periwound Skin Texture: No Abnormalities Noted N/A N/A Periwound Skin Moist: Yes N/A N/A Moisture: Dry/Scaly: Yes Periwound Skin Color: No Abnormalities Noted N/A N/A Temperature: No Abnormality N/A N/A Tenderness on Yes N/A N/A Palpation: Wound Preparation: Ulcer Cleansing: N/A N/A Rinsed/Irrigated with [name], Natalie Y. ([id number]) Saline Topical Anesthetic Applied: Other: lidocaine 4% Treatment Notes Electronic Signature(s) Signed: 05/13/2016 1:16:38 PM By: Alejandro MullingPinkerton, Debra Entered By: Alejandro MullingPinkerton, Debra on 05/13/2016 09:22:29 [name], Kelli Y. ([id number]) -------------------------------------------------------------------------------- Multi-Disciplinary Care Plan Details Patient Name: [name], Jenness Y. Date of Service: 05/13/2016 8:45 AM Medical Record Patient Account Number:  [id number] Number: Treating RN: Phillis Haggis 1933-07-06 (82 [name] Other Clinician: Date of Birth/Sex: Female) Treating ROBSON, MICHAEL Primary Care Physician: [name] Physician/Extender: G Referring Physician: Charolotte Capuchin in Treatment: 21 Active Inactive Abuse / Safety / Falls / Self Care Management Nursing Diagnoses: Impaired  physical mobility Potential for falls Goals: Patient will remain injury free Date Initiated: 12/17/2015 Goal Status: Active Interventions: Assess fall risk on admission and as needed Notes: Necrotic Tissue Nursing Diagnoses: Impaired tissue integrity related to necrotic/devitalized tissue Knowledge deficit related to management of necrotic/devitalized tissue Goals: Necrotic/devitalized tissue will be minimized in the wound bed Date Initiated: 02/04/2016 Goal Status: Active Interventions: Assess patient pain level pre-, during and post procedure and prior to discharge Provide education on necrotic tissue and debridement process Treatment Activities: Apply topical anesthetic as ordered : 02/04/2016 Notes: DELORSE, SHANE ([id number]) Wound/Skin Impairment Nursing Diagnoses: Impaired tissue integrity Goals: Patient/caregiver will verbalize understanding of skin care regimen Date Initiated: 12/17/2015 Goal Status: Active Ulcer/skin breakdown will have a volume reduction of 30% by week 4 Date Initiated: 12/17/2015 Goal Status: Active Ulcer/skin breakdown will have a volume reduction of 50% by week 8 Date Initiated: 12/17/2015 Goal Status: Active Ulcer/skin breakdown will have a volume reduction of 80% by week 12 Date Initiated: 12/17/2015 Goal Status: Active Ulcer/skin breakdown will heal within 14 weeks Date Initiated: 12/17/2015 Goal Status: Active Interventions: Assess patient/caregiver ability to obtain necessary supplies Assess patient/caregiver ability to perform ulcer/skin care regimen upon admission and as needed Assess ulceration(s) every visit Notes: Electronic Signature(s) Signed: 05/13/2016 1:16:38 PM By: Alejandro Mulling Entered By: Alejandro Mulling on 05/13/2016 09:22:22 Natalie Rima ([id number]) -------------------------------------------------------------------------------- Pain Assessment Details Patient Name: Natalie Rima. Date of Service: 05/13/2016  8:45 AM Medical Record Patient Account Number: [id number] Number: Treating RN: Phillis Haggis Oct 21, 1933 (82 [name] Other Clinician: Date of Birth/Sex: Female) Treating ROBSON, MICHAEL Primary Care Physician: [name] Physician/Extender: G Referring Physician: Charolotte Capuchin in Treatment: 21 Active Problems Location of Pain Severity and Description of Pain Patient Has Paino No Site Locations With Dressing Change: No Pain Management and Medication Current Pain Management: Electronic Signature(s) Signed: 05/13/2016 1:16:38 PM By: Alejandro Mulling Entered By: Alejandro Mulling on 05/13/2016 08:58:45 Natalie Rima ([id number]) -------------------------------------------------------------------------------- Patient/Caregiver Education Details Patient Name: Natalie Rima. Date of Service: 05/13/2016 8:45 AM Medical Record Patient Account Number: [id number] Number: Treating RN: Phillis Haggis 1933/10/03 (82 [name] Other Clinician: Date of Birth/Gender: Female) Treating ROBSON, MICHAEL Primary Care Physician: [name] Physician/Extender: G Referring Physician: Charolotte Capuchin in Treatment: 21 Education Assessment Education Provided To: Patient Education Topics Provided Wound/Skin Impairment: Handouts: Other: change dressing as ordered Methods: Demonstration, Explain/Verbal Responses: State content correctly Electronic Signature(s) Signed: 05/13/2016 1:16:38 PM By: Alejandro Mulling Entered By: Alejandro Mulling on 05/13/2016 10:07:47 Natalie Rima ([id number]) -------------------------------------------------------------------------------- Wound Assessment Details Patient Name: Natalie Rima Date of Service: 05/13/2016 8:45 AM Medical Record Patient Account Number: [id number] Number: Treating RN: Phillis Haggis 12-05-1933 (82 [name] Other Clinician: Date of Birth/Sex: Female) Treating ROBSON, MICHAEL Primary  Care Physician: [name] Physician/Extender: G Referring Physician: Charolotte Capuchin in Treatment: 21 Wound Status Wound Number: 14 Primary Open Surgical Wound Etiology: Wound Location: Right Amputation Site - Digit Wound Open Wounding Event: Surgical Injury Status: Date Acquired: 11/11/2015 Comorbid Arrhythmia, Congestive Heart Failure, Weeks Of Treatment: 19 History: Hypertension, Type II Diabetes Clustered Wound: No Photos Photo Uploaded By: Alejandro Mulling on 05/13/2016 09:25:33 Wound Measurements Length: (cm) 0.3 Width: (cm) 0.2 Depth: (cm)  0.3 Area: (cm) 0.047 Volume: (cm) 0.014 % Reduction in Area: 93.8% % Reduction in Volume: 81.3% Epithelialization: None Tunneling: No Undermining: No Wound Description Full Thickness Without Exposed Classification: Support Structures Wound Margin: Flat and Intact Exudate Large Amount: Exudate Type: Serous Exudate Color: amber Foul Odor After Cleansing: No Wound Bed Granulation Amount: Small (1-33%) Exposed Structure HADESSAH, GRENNAN ([id number]) Granulation Quality: Pink Fascia Exposed: No Necrotic Amount: Large (67-100%) Fat Layer Exposed: No Necrotic Quality: Adherent Slough Tendon Exposed: No Muscle Exposed: No Joint Exposed: No Bone Exposed: No Limited to Skin Breakdown Periwound Skin Texture Texture Color No Abnormalities Noted: No No Abnormalities Noted: No Callus: No Atrophie Blanche: No Crepitus: No Cyanosis: No Excoriation: No Ecchymosis: No Fluctuance: No Erythema: No Friable: No Hemosiderin Staining: No Induration: No Mottled: No Localized Edema: No Pallor: No Rash: No Rubor: No Scarring: No Temperature / Pain Moisture Temperature: No Abnormality No Abnormalities Noted: No Tenderness on Palpation: Yes Dry / Scaly: No Maceration: No Moist: Yes Wound Preparation Ulcer Cleansing: Rinsed/Irrigated with Saline Topical Anesthetic Applied: Other: lidocaine 4%, Treatment  Notes Wound #14 (Right Amputation Site - Digit) 1. Cleansed with: Clean wound with Normal Saline 2. Anesthetic Topical Lidocaine 4% cream to wound bed prior to debridement 3. Peri-wound Care: Skin Prep 4. Dressing Applied: Hydrafera Blue Notes band-aide Electronic Signature(s) Signed: 05/13/2016 1:16:38 PM By: Alejandro Mulling Entered By: Alejandro Mulling on 05/13/2016 09:09:55 KLAUDIA, BEIRNE ([id number]) ANGELLA, MONTAS ([id number]) -------------------------------------------------------------------------------- Wound Assessment Details Patient Name: Natalie Rima Date of Service: 05/13/2016 8:45 AM Medical Record Patient Account Number: [id number] Number: Treating RN: Phillis Haggis Feb 24, 1934 (82 [name] Other Clinician: Date of Birth/Sex: Female) Treating ROBSON, MICHAEL Primary Care Physician: [name] Physician/Extender: G Referring Physician: Charolotte Capuchin in Treatment: 21 Wound Status Wound Number: 17 Primary Diabetic Wound/Ulcer of the Lower Etiology: Extremity Wound Location: Right Achilles - Proximal Secondary Arterial Insufficiency Ulcer Wounding Event: Gradually Appeared Etiology: Date Acquired: 06/03/2015 Wound Open Weeks Of Treatment: 3 Status: Clustered Wound: No Comorbid Arrhythmia, Congestive Heart History: Failure, Hypertension, Type II Diabetes Photos Photo Uploaded By: Alejandro Mulling on 05/13/2016 09:25:33 Wound Measurements Length: (cm) 1.2 % Reduction in Width: (cm) 0.2 % Reduction in Depth: (cm) 0.4 Epithelializati Area: (cm) 0.188 Tunneling: Volume: (cm) 0.075 Position (o Maximum Dist Area: 4.1% Volume: 23.5% on: None Yes 'clock): 12 ance: (cm) 2.9 Undermining: No Wound Description Classification: Grade 1 Foul Odor Afte Wound Margin: Distinct, outline attached Exudate Amount: Large Exudate Type: Serosanguineous MODENA, BELLEMARE ([id number]) r Cleansing: No Exudate Color: red, brown Wound  Bed Granulation Amount: Large (67-100%) Exposed Structure Granulation Quality: Red Fascia Exposed: No Necrotic Amount: Small (1-33%) Fat Layer Exposed: No Necrotic Quality: Adherent Slough Tendon Exposed: No Muscle Exposed: No Joint Exposed: No Bone Exposed: No Limited to Skin Breakdown Periwound Skin Texture Texture Color No Abnormalities Noted: No No Abnormalities Noted: No Moisture Temperature / Pain No Abnormalities Noted: No Temperature: No Abnormality Tenderness on Palpation: Yes Wound Preparation Ulcer Cleansing: Rinsed/Irrigated with Saline Topical Anesthetic Applied: Other: lidocaine 4%, Treatment Notes Wound #17 (Right, Proximal Achilles) 1. Cleansed with: Clean wound with Normal Saline 2. Anesthetic Topical Lidocaine 4% cream to wound bed prior to debridement 4. Dressing Applied: Other dressing (specify in notes) 5. Secondary Dressing Applied ABD Pad Dry Gauze Kerlix/Conform Notes xtrasorb, charcoal, Apligraph, coban Electronic Signature(s) Signed: 05/13/2016 1:16:38 PM By: Alejandro Mulling Entered By: Alejandro Mulling on 05/13/2016 09:12:11 Natalie Rima ([id number]) -------------------------------------------------------------------------------- Wound Assessment Details Patient Name: Natalie Rima Date of  Service: 05/13/2016 8:45 AM Medical Record Patient Account Number: [id number] Number: Treating RN: Phillis Haggis 20-Mar-1934 (82 [name] Other Clinician: Date of Birth/Sex: Female) Treating ROBSON, MICHAEL Primary Care Physician: [name] Physician/Extender: G Referring Physician: Charolotte Capuchin in Treatment: 21 Wound Status Wound Number: 18 Primary To be determined Etiology: Wound Location: Right Abdomen - Lower Quadrant Wound Open Status: Wounding Event: Gradually Appeared Comorbid Arrhythmia, Congestive Heart Failure, Date Acquired: 05/13/2016 History: Hypertension, Type II Diabetes Weeks Of Treatment:  0 Clustered Wound: No Photos Photo Uploaded By: Alejandro Mulling on 05/13/2016 09:26:07 Wound Measurements Length: (cm) 0.2 Width: (cm) 1.2 Depth: (cm) 0.1 Area: (cm) 0.188 Volume: (cm) 0.019 % Reduction in Area: % Reduction in Volume: Epithelialization: None Tunneling: No Undermining: No Wound Description Classification: Partial Thickness Wound Margin: Distinct, outline attached Exudate Amount: Medium Exudate Type: Serous Exudate Color: amber Foul Odor After Cleansing: No Wound Bed Granulation Amount: Medium (34-66%) Exposed Structure Granulation Quality: Red Fascia Exposed: No JAZLYNE, GAUGER ([id number]) Necrotic Amount: Medium (34-66%) Fat Layer Exposed: No Necrotic Quality: Adherent Slough Tendon Exposed: No Muscle Exposed: No Joint Exposed: No Bone Exposed: No Limited to Skin Breakdown Periwound Skin Texture Texture Color No Abnormalities Noted: No No Abnormalities Noted: No Moisture Temperature / Pain No Abnormalities Noted: No Temperature: No Abnormality Moist: Yes Tenderness on Palpation: Yes Wound Preparation Ulcer Cleansing: Rinsed/Irrigated with Saline Topical Anesthetic Applied: Other: lidocaine 4%, Treatment Notes Wound #18 (Right Abdomen - Lower Quadrant) 1. Cleansed with: Clean wound with Normal Saline 2. Anesthetic Topical Lidocaine 4% cream to wound bed prior to debridement 3. Peri-wound Care: Skin Prep 4. Dressing Applied: Aquacel Ag 5. Secondary Dressing Applied Bordered Foam Dressing Electronic Signature(s) Signed: 05/13/2016 1:16:38 PM By: Alejandro Mulling Entered By: Alejandro Mulling on 05/13/2016 09:22:15 Natalie Rima ([id number]) -------------------------------------------------------------------------------- Wound Assessment Details Patient Name: Natalie Rima Date of Service: 05/13/2016 8:45 AM Medical Record Patient Account Number: [id number] Number: Treating RN: Phillis Haggis 1933-09-05 (82  [name] Other Clinician: Date of Birth/Sex: Female) Treating ROBSON, MICHAEL Primary Care Physician: [name] Physician/Extender: G Referring Physician: Charolotte Capuchin in Treatment: 21 Wound Status Wound Number: 9 Primary Diabetic Wound/Ulcer of the Lower Etiology: Extremity Wound Location: Right Achilles - Distal Secondary Arterial Insufficiency Ulcer Wounding Event: Gradually Appeared Etiology: Date Acquired: 06/02/2015 Wound Open Weeks Of Treatment: 21 Status: Clustered Wound: No Comorbid Arrhythmia, Congestive Heart Pending Amputation On Presentation History: Failure, Hypertension, Type II Diabetes Photos Photo Uploaded By: Alejandro Mulling on 05/13/2016 09:26:08 Wound Measurements Length: (cm) 5.1 Width: (cm) 2 Depth: (cm) 0.2 Area: (cm) 8.011 Volume: (cm) 1.602 % Reduction in Area: 75.4% % Reduction in Volume: 83.6% Epithelialization: None Tunneling: No Undermining: No Wound Description Classification: Grade 2 Wound Margin: Distinct, outline attached Exudate Amount: Large Exudate Type: Serosanguineous Exudate Color: red, brown Foul Odor After Cleansing: No Wound Bed DERYL, PORTS ([id number]) Granulation Amount: Medium (34-66%) Exposed Structure Granulation Quality: Pink Fascia Exposed: No Necrotic Amount: Medium (34-66%) Fat Layer Exposed: No Necrotic Quality: Adherent Slough Tendon Exposed: No Muscle Exposed: No Joint Exposed: No Bone Exposed: No Limited to Skin Breakdown Periwound Skin Texture Texture Color No Abnormalities Noted: No No Abnormalities Noted: No Moisture Temperature / Pain No Abnormalities Noted: No Temperature: No Abnormality Dry / Scaly: Yes Tenderness on Palpation: Yes Moist: Yes Wound Preparation Ulcer Cleansing: Rinsed/Irrigated with Saline Topical Anesthetic Applied: Other: lidocaine 4%, Treatment Notes Wound #9 (Right, Distal Achilles) 1. Cleansed with: Clean wound with Normal Saline 2.  Anesthetic Topical Lidocaine 4% cream to  wound bed prior to debridement 4. Dressing Applied: Other dressing (specify in notes) 5. Secondary Dressing Applied ABD Pad Dry Gauze Kerlix/Conform Notes xtrasorb, charcoal, Apligraph, coban Electronic Signature(s) Signed: 05/13/2016 1:16:38 PM By: Alejandro Mulling Entered By: Alejandro Mulling on 05/13/2016 09:10:56 Natalie Rima ([id number]) -------------------------------------------------------------------------------- Vitals Details Patient Name: Natalie Rima Date of Service: 05/13/2016 8:45 AM Medical Record Patient Account Number: [id number] Number: Treating RN: Phillis Haggis [birth year] (82 [name] Other Clinician: Date of Birth/Sex: Female) Treating ROBSON, MICHAEL Primary Care Physician: [name] Physician/Extender: G Referring Physician: Charolotte Capuchin in Treatment: 21 Vital Signs Time Taken: 09:00 Pulse (bpm): 75 Height (in): 65 Respiratory Rate (breaths/min): 16 Weight (lbs): 160 Blood Pressure (mmHg): 115/59 Body Mass Index (BMI): 26.6 Reference Range: 80 - 120 mg / dl Electronic Signature(s) Signed: 05/13/2016 1:16:38 PM By: Alejandro Mulling Entered By: Alejandro Mulling on 05/13/2016 09:02:02

## 2016-05-15 ENCOUNTER — Encounter: Payer: Self-pay | Admitting: Vascular Surgery

## 2016-05-20 ENCOUNTER — Encounter: Payer: Self-pay | Admitting: Vascular Surgery

## 2016-05-20 ENCOUNTER — Ambulatory Visit: Payer: Medicare Other | Admitting: Internal Medicine

## 2016-05-21 DIAGNOSIS — E11621 Type 2 diabetes mellitus with foot ulcer: Secondary | ICD-10-CM | POA: Diagnosis not present

## 2016-05-22 ENCOUNTER — Encounter (INDEPENDENT_AMBULATORY_CARE_PROVIDER_SITE_OTHER): Payer: Self-pay | Admitting: Vascular Surgery

## 2016-05-22 ENCOUNTER — Ambulatory Visit (INDEPENDENT_AMBULATORY_CARE_PROVIDER_SITE_OTHER): Payer: Medicare Other | Admitting: Vascular Surgery

## 2016-05-22 VITALS — BP 108/63 | HR 68 | Resp 16 | Wt 151.0 lb

## 2016-05-22 DIAGNOSIS — N186 End stage renal disease: Secondary | ICD-10-CM

## 2016-05-22 DIAGNOSIS — Y841 Kidney dialysis as the cause of abnormal reaction of the patient, or of later complication, without mention of misadventure at the time of the procedure: Secondary | ICD-10-CM

## 2016-05-22 DIAGNOSIS — I6523 Occlusion and stenosis of bilateral carotid arteries: Secondary | ICD-10-CM

## 2016-05-22 NOTE — Assessment & Plan Note (Signed)
Scheduled to be rechecked next year

## 2016-05-22 NOTE — Progress Notes (Signed)
Patient ID: [name], female   DOB: Oct 29, 1933, 82 y.o.   MRN: [id number]  Chief Complaint  Patient presents with  . Follow-up    HPI [name] is a 82 y.o. female.  Patient returns about 9 days after right arm Artegraft revision of her AV fistula. Her incisions are healing well. There is a good thrill in the access. Her PermCath does not really flow very well.   Past Medical History:  Diagnosis Date  . Anemia   . Anxiety   . Arthritis    gout  . Atrial fibrillation (HCC)   . CHF (congestive heart failure) (HCC)    has had this off and on prior to dialysis but has had several times since  . Chronic kidney disease    end stage renal insufficiency.  on dialysis M,W,F  . Diabetes mellitus without complication (HCC)   . Dialysis patient [facility](HCC)    Mon.- Wed- Fri.  . Dysrhythmia    afib/flutter  . GERD (gastroesophageal reflux disease)   . Hypertension   . 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 Peripheral vascular disease (HCC)   . Pleural effusion   . Pulmonary hypertension   . Renal insufficiency   . Restless leg syndrome   . Shortness of breath dyspnea    with exertion  . Stroke (HCC) 06/2015   has had 1 other stroke. first one affected vision. the most recent one affected right arm    Past Surgical History:  Procedure Laterality Date  . AMPUTATION Right 11/07/2015   Procedure: AMPUTATION DIGIT ( 4TH TOE, RIGHT FOOT );  Surgeon: Annice NeedyJason S Drianna Chandran, MD;  Location: [facility] ORS;  Service: Vascular;  Laterality: Right;  . AMPUTATION TOE Left 06/07/2015   Procedure: AMPUTATION TOE;  Surgeon: Gwyneth RevelsJustin Fowler, DPM;  Location: [facility];  Service: Podiatry;  Laterality: Left;  . CARDIAC CATHETERIZATION    . CATARACT EXTRACTION, BILATERAL Bilateral   . CHOLECYSTECTOMY    . DILATION AND CURETTAGE OF UTERUS    . ENDARTERECTOMY Left 07/26/2015   Procedure: ENDARTERECTOMY CAROTID;  Surgeon: Renford DillsGregory G Schnier, MD;  Location: [facility] OR[facility];  Service: Vascular;  Laterality:  Left;  . EYE SURGERY    . FISTULAGRAM ([facility] HX)    . HEEL DECUBITUS ULCER EXCISION Right 05/13/2016  . INCISION AND DRAINAGE ABSCESS Right 02/16/2016   Procedure: INCISION AND DRAINAGE ABSCESS and application wound vac;  Surgeon: Bertram DenverMiechia A Esco, MD;  Location: [facility] OR[facility];  Service: Urology;  Laterality: Right;  . JOINT REPLACEMENT     bilateral hip replacement  . MEDTRONIC BLADDER INTERSTEM     currently turned off for MRI in January  . PARATHYROIDECTOMY    . PERIPHERAL VASCULAR CATHETERIZATION N/A 10/08/2014   Procedure: A/V Shuntogram/Fistulagram;  Surgeon: Annice NeedyJason S Sheridan Hew, MD;  Location: [facility];  Service: Cardiovascular;  Laterality: N/A;  . PERIPHERAL VASCULAR CATHETERIZATION N/A 03/04/2015   Procedure: A/V Shuntogram/Fistulagram;  Surgeon: Annice NeedyJason S Frances Joynt, MD;  Location: [facility];  Service: Cardiovascular;  Laterality: N/A;  . PERIPHERAL VASCULAR CATHETERIZATION N/A 03/04/2015   Procedure: A/V Shunt Intervention;  Surgeon: Annice NeedyJason S Carisa Backhaus, MD;  Location: [facility];  Service: Cardiovascular;  Laterality: N/A;  . PERIPHERAL VASCULAR CATHETERIZATION N/A 09/16/2015   Procedure: Lower Extremity Angiography;  Surgeon: Annice NeedyJason S Chasiti Waddington, MD;  Location: [facility];  Service: Cardiovascular;  Laterality: N/A;  . PERIPHERAL VASCULAR CATHETERIZATION  09/16/2015   Procedure: Lower Extremity  Intervention;  Surgeon: Annice NeedyJason S Lucill Mauck, MD;  Location: [facility];  Service: Cardiovascular;;  . PERIPHERAL VASCULAR CATHETERIZATION Right 04/27/2016   Procedure: A/V Shuntogram/Fistulagram;  Surgeon: Annice NeedyJason S Mallika Sanmiguel, MD;  Location: [facility];  Service: Cardiovascular;  Laterality: Right;  . PERIPHERAL VASCULAR CATHETERIZATION Left 05/14/2016   Procedure: DIALYSIS/PERMA CATHETER INSERTION;  Surgeon: Annice NeedyJason S Fernanda Twaddell, MD;  Location: [facility];  Service: Vascular;  Laterality: Left;  . REVISON OF ARTERIOVENOUS FISTULA Right 05/14/2016   Procedure: REVISON OF ARTERIOVENOUS FISTULA (  WITH ARTEGRAFT );  Surgeon: Annice NeedyJason S Mahitha Hickling, MD;  Location: [facility];  Service: Vascular;  Laterality: Right;  . THYROIDECTOMY, PARTIAL    . TONSILLECTOMY    . WOUND DEBRIDEMENT Right 11/07/2015   Procedure: DEBRIDEMENT WOUND ( RIGHT HEEL AND ABDOMINAL WOUND (2) );  Surgeon: Annice NeedyJason S Mubarak Bevens, MD;  Location: [facility];  Service: Vascular;  Laterality: Right;  . WOUND DEBRIDEMENT N/A 11/28/2015   Procedure: DEBRIDEMENT WOUND ( ABDOMINAL );  Surgeon: Annice NeedyJason S Basir Niven, MD;  Location: [facility];  Service: Vascular;  Laterality: N/A;      Allergies  Allergen Reactions  . Codeine Other (See Comments)    Reaction:  Confusion/Hallucinations   . Contrast Media [Iodinated Diagnostic Agents] Other (See Comments)    "skin peel" breaks into a blistered rash, then peel. If given Benadryl and solumedrol she will be okay  . Oxycodone Other (See Comments)    Reaction:  Confusion/Hallucinations   . Quinine Derivatives Rash  . Tape Other (See Comments)    Skin is very thin and will blister with any tape except paper tape.  PLEASE USE PAPER TAPE.    Current Outpatient Prescriptions  Medication Sig Dispense Refill  . acetaminophen (TYLENOL) 650 MG CR tablet Take 1,300 mg by mouth 2 (two) times daily.    Marland Kitchen. albuterol (PROVENTIL HFA;VENTOLIN HFA) 108 (90 Base) MCG/ACT inhaler Inhale 1 puff into the lungs every 6 (six) hours as needed for wheezing or shortness of breath.     . Biotin 1 MG CAPS Take 1 mg by mouth daily.     . calcium acetate (PHOSLO) 667 MG tablet Take 1,334 mg by mouth 3 (three) times daily with meals. With any food    . diltiazem (TIAZAC) 120 MG 24 hr capsule Take 120 mg by mouth every morning.     . folic acid-vitamin b complex-vitamin c-selenium-zinc (DIALYVITE) 3 MG TABS tablet Take 1 tablet by mouth daily.    . furosemide (LASIX) 80 MG tablet Take 80 mg by mouth 2 (two) times daily.    Marland Kitchen. gabapentin (NEURONTIN) 100 MG capsule Take 100 mg by mouth every Tuesday, Thursday, Saturday, and Sunday. At bedtime      . gabapentin (NEURONTIN) 300 MG capsule Take 300 mg by mouth every Monday, Wednesday, and Friday at 8 PM. At bedtime    . insulin detemir (LEVEMIR) 100 UNIT/ML injection Inject 0.06 mLs (6 Units total) into the skin 2 (two) times daily. 10 mL 11  . Omega-3 Fatty Acids (FISH OIL) 1000 MG CAPS Take 1,000 mg by mouth 2 (two) times daily.    Marland Kitchen. PARoxetine (PAXIL) 40 MG tablet Take 40 mg by mouth at bedtime.     . pravastatin (PRAVACHOL) 20 MG tablet Take 1 tablet (20 mg total) by mouth at bedtime. 30 tablet 0  . ranitidine (ZANTAC) 150 MG tablet Take 150 mg by mouth at bedtime.     . traMADol (ULTRAM) 50 MG tablet Take 50 mg  by mouth every 6 (six) hours as needed (pain).    . traMADol (ULTRAM) 50 MG tablet Take 1 tablet (50 mg total) by mouth every 6 (six) hours as needed. 30 tablet 0  . warfarin (COUMADIN) 4 MG tablet Take 4 mg by mouth daily at 8 pm.     No current facility-administered medications for this visit.         Physical Exam BP 108/63   Pulse 68   Resp 16   Wt 68.5 kg (151 lb)   BMI 25.13 kg/m  Gen:  WD/WN, NAD Skin: incision C/D/I. Good thrill in jump graft     Assessment/Plan:  No problem-specific Assessment & Plan notes found for this encounter.      Festus Barren 05/22/2016, 11:22 AM   This note was created with Dragon medical transcription system.  Any errors from dictation are unintentional.

## 2016-05-22 NOTE — Assessment & Plan Note (Signed)
Her AV fistula revision looks like it is healing well. We can start using that next week.

## 2016-05-22 NOTE — Assessment & Plan Note (Signed)
We can start using her access next Wednesday.

## 2016-05-22 NOTE — Anesthesia Postprocedure Evaluation (Signed)
Anesthesia Post Note  Patient: [name]  Procedure(s) Performed: Procedure(s) (LRB): REVISON OF ARTERIOVENOUS FISTULA ( WITH ARTEGRAFT ) (Right) DIALYSIS/PERMA CATHETER INSERTION (Left)  Patient location during evaluation: PACU Anesthesia Type: General Level of consciousness: awake and alert Pain management: pain level controlled Vital Signs Assessment: post-procedure vital signs reviewed and stable Respiratory status: spontaneous breathing, nonlabored ventilation, respiratory function stable and patient connected to nasal cannula oxygen Cardiovascular status: blood pressure returned to baseline and stable Postop Assessment: no signs of nausea or vomiting Anesthetic complications: no     Last Vitals:  Vitals:   05/14/16 1842 05/14/16 1859  BP: 140/77 124/60  Pulse: 85 78  Resp: 16 16  Temp: 36.7 C     Last Pain:  Vitals:   05/15/16 0854  TempSrc:   PainSc: 0-No pain                 Yevette EdwardsJames G Adams

## 2016-05-27 ENCOUNTER — Encounter: Payer: No Typology Code available for payment source | Admitting: Internal Medicine

## 2016-05-27 DIAGNOSIS — E1151 Type 2 diabetes mellitus with diabetic peripheral angiopathy without gangrene: Secondary | ICD-10-CM | POA: Diagnosis not present

## 2016-05-27 DIAGNOSIS — Z992 Dependence on renal dialysis: Secondary | ICD-10-CM | POA: Diagnosis not present

## 2016-05-27 DIAGNOSIS — Z8673 Personal history of transient ischemic attack (TIA), and cerebral infarction without residual deficits: Secondary | ICD-10-CM | POA: Diagnosis not present

## 2016-05-27 DIAGNOSIS — E1122 Type 2 diabetes mellitus with diabetic chronic kidney disease: Secondary | ICD-10-CM | POA: Diagnosis not present

## 2016-05-27 DIAGNOSIS — L97512 Non-pressure chronic ulcer of other part of right foot with fat layer exposed: Secondary | ICD-10-CM | POA: Diagnosis not present

## 2016-05-27 DIAGNOSIS — I482 Chronic atrial fibrillation: Secondary | ICD-10-CM | POA: Diagnosis not present

## 2016-05-27 DIAGNOSIS — X58XXXD Exposure to other specified factors, subsequent encounter: Secondary | ICD-10-CM | POA: Diagnosis not present

## 2016-05-27 DIAGNOSIS — I132 Hypertensive heart and chronic kidney disease with heart failure and with stage 5 chronic kidney disease, or end stage renal disease: Secondary | ICD-10-CM | POA: Diagnosis not present

## 2016-05-27 DIAGNOSIS — S31103D Unspecified open wound of abdominal wall, right lower quadrant without penetration into peritoneal cavity, subsequent encounter: Secondary | ICD-10-CM | POA: Diagnosis not present

## 2016-05-27 DIAGNOSIS — Z794 Long term (current) use of insulin: Secondary | ICD-10-CM | POA: Diagnosis not present

## 2016-05-27 DIAGNOSIS — S31104D Unspecified open wound of abdominal wall, left lower quadrant without penetration into peritoneal cavity, subsequent encounter: Secondary | ICD-10-CM | POA: Diagnosis not present

## 2016-05-27 DIAGNOSIS — N186 End stage renal disease: Secondary | ICD-10-CM | POA: Diagnosis not present

## 2016-05-27 DIAGNOSIS — I5033 Acute on chronic diastolic (congestive) heart failure: Secondary | ICD-10-CM | POA: Diagnosis not present

## 2016-05-27 DIAGNOSIS — E11621 Type 2 diabetes mellitus with foot ulcer: Secondary | ICD-10-CM | POA: Diagnosis present

## 2016-05-28 NOTE — Progress Notes (Signed)
[name] ([id number]) Visit Report for 05/27/2016 Arrival Information Details Patient Name: [name], [name]. Date of Service: 05/27/2016 3:45 PM Medical Record Patient Account Number: [id number] Number: Treating RN: Huel Coventry [birth year] (82 y.o. Other Clinician: Date of Birth/Sex: Female) Treating ROBSON, MICHAEL Primary Care Physician: [name] Physician/Extender: G Referring Physician: Charolotte Capuchin in Treatment: 23 Visit Information History Since Last Visit Added or deleted any medications: No Patient Arrived: Wheel Chair Any new allergies or adverse reactions: No Arrival Time: 15:39 Had a fall or experienced change in No Accompanied By: son activities of daily living that may affect Transfer Assistance: None risk of falls: Patient Identification Verified: Yes Signs or symptoms of abuse/neglect since last No Secondary Verification Process Yes visito Completed: Hospitalized since last visit: No Patient Requires Transmission- No Has Dressing in Place as Prescribed: Yes Based Precautions: Pain Present Now: No Patient Has Alerts: Yes Patient Alerts: Patient on Blood Thinner DMII Warfarin ABI [place] Bilateral NO BP RIGHT ARM Electronic Signature(s) Signed: 05/27/2016 6:06:27 PM By: Elliot Gurney, RN, BSN, Kim RN, BSN Entered By: Elliot Gurney, RN, BSN, Kim on 05/27/2016 15:39:32 [name] ([id number]) -------------------------------------------------------------------------------- Encounter Discharge Information Details Patient Name: [name]. Date of Service: 05/27/2016 3:45 PM Medical Record Patient Account Number: [id number] Number: Treating RN: Huel Coventry 1933/07/06 (82 y.o. Other Clinician: Date of Birth/Sex: Female) Treating ROBSON, MICHAEL Primary Care Physician: [name] Physician/Extender: G Referring Physician: Charolotte Capuchin in Treatment: 23 Encounter Discharge Information Items Discharge Pain  Level: 0 Discharge Condition: Stable Ambulatory Status: Ambulatory Discharge Destination: Home Transportation: Private Auto Accompanied By: son Schedule Follow-up Appointment: No Medication Reconciliation completed and provided to Patient/Care No Khushi Zupko: Provided on Clinical Summary of Care: 05/27/2016 Form Type Recipient Paper Patient [facility] Electronic Signature(s) Signed: 05/27/2016 6:06:27 PM By: Elliot Gurney RN, BSN, Kim RN, BSN Previous Signature: 05/27/2016 4:34:54 PM Version By: Gwenlyn Perking Entered By: Elliot Gurney RN, BSN, Kim on 05/27/2016 17:19:28 [name] ([id number]) -------------------------------------------------------------------------------- Lower Extremity Assessment Details Patient Name: [name]. Date of Service: 05/27/2016 3:45 PM Medical Record Patient Account Number: [id number] Number: Treating RN: Huel Coventry Nov 28, 1933 (82 y.o. Other Clinician: Date of Birth/Sex: Female) Treating ROBSON, MICHAEL Primary Care Physician: [name] Physician/Extender: G Referring Physician: Charolotte Capuchin in Treatment: 23 Vascular Assessment Pulses: Dorsalis Pedis Palpable: [Right:Yes] Posterior Tibial Extremity colors, hair growth, and conditions: Extremity Color: [Right:Hyperpigmented] Hair Growth on Extremity: [Right:No] Temperature of Extremity: [Right:Warm] Dependent Rubor: [Right:No] Blanched when Elevated: [Right:No] Lipodermatosclerosis: [Right:No] Toe Nail Assessment Left: Right: Thick: No Discolored: No Deformed: No Improper Length and Hygiene: No Electronic Signature(s) Signed: 05/27/2016 6:06:27 PM By: Elliot Gurney, RN, BSN, Kim RN, BSN Entered By: Elliot Gurney, RN, BSN, Kim on 05/27/2016 16:13:41 [name] ([id number]) -------------------------------------------------------------------------------- Multi Wound Chart Details Patient Name: [name]. Date of Service: 05/27/2016 3:45 PM Medical Record Patient Account  Number: [id number] Number: Treating RN: Huel Coventry 12-28-1933 (82 y.o. Other Clinician: Date of Birth/Sex: Female) Treating ROBSON, MICHAEL Primary Care Physician: [name] Physician/Extender: G Referring Physician: Charolotte Capuchin in Treatment: 23 Vital Signs Height(in): 65 Pulse(bpm): 85 Weight(lbs): 160 Blood Pressure 108/48 (mmHg): Body Mass Index(BMI): 27 Temperature(F): Respiratory Rate 16 (breaths/min): Photos: [14:No Photos] [17:No Photos] [18:No Photos] Wound Location: [14:Right Amputation Site - Digit] [17:Right Achilles - Proximal] [18:Right Abdomen - Lower Quadrant] Wounding Event: [14:Surgical Injury] [17:Gradually Appeared] [18:Gradually Appeared] Primary Etiology: [14:Open Surgical Wound] [17:Diabetic Wound/Ulcer of Infection - not elsewhere the Lower Extremity] [18:classified] Secondary Etiology: [14:N/A] [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]  Comorbid History: [14:N/A] [17:Arrhythmia, Congestive Arrhythmia, Congestive Heart Failure, Hypertension, Type II Diabetes] [18:Heart Failure, Hypertension, Type II Diabetes] Date Acquired: [14:11/11/2015] [17:06/03/2015] [18:05/13/2016] Weeks of Treatment: [14:21] [17:5] [18:2] Wound Status: [14:Healed - Epithelialized] [17:Open] [18:Open] Pending Amputation on No [17:No] [18:No] Presentation: Measurements L x W x D 0x0x0 [17:1x0.4x0.4] [18:0x0x0] (cm) Area (cm) : [14:0] [17:0.314] [18:0] Volume (cm) : [14:0] [17:0.126] [18:0] % Reduction in Area: [14:100.00%] [17:-60.20%] [18:100.00%] % Reduction in Volume: 100.00% [17:-28.60%] [18:100.00%] Position 1 (o'clock): [17:12] Maximum Distance 1 [17:4.5] (cm): Tunneling: [14:N/A] [17:Yes] [18:N/A] Classification: [17:Grade 1] [18:Partial Thickness] Full Thickness Without Exposed Support Structures Exudate Amount: N/A Large Medium Exudate Type: N/A Serosanguineous Serous Exudate Color: N/A red, brown amber Wound Margin: N/A Distinct, outline  attached Distinct, outline attached Granulation Amount: N/A Large (67-100%) Medium (34-66%) Granulation Quality: N/A Red Red Necrotic Amount: N/A Small (1-33%) Medium (34-66%) Epithelialization: N/A None None Debridement: N/A N/A N/A Tissue Debrided: N/A N/A N/A Level: N/A N/A N/A Debridement Area (sq N/A N/A N/A cm): Instrument: N/A N/A N/A Bleeding: N/A N/A N/A Procedural Pain: N/A N/A N/A Post Procedural Pain: N/A N/A N/A Debridement Treatment N/A N/A N/A Response: Post Debridement N/A N/A N/A Measurements L x W x D (cm) Post Debridement N/A N/A N/A Volume: (cm) Periwound Skin Texture: No Abnormalities Noted No Abnormalities Noted No Abnormalities Noted Periwound Skin No Abnormalities Noted No Abnormalities Noted Moist: Yes Moisture: Periwound Skin Color: No Abnormalities Noted No Abnormalities Noted No Abnormalities Noted Temperature: N/A No Abnormality No Abnormality Tenderness on No Yes Yes Palpation: Wound Preparation: N/A Ulcer Cleansing: Ulcer Cleansing: Rinsed/Irrigated with Rinsed/Irrigated with Saline Saline Topical Anesthetic Topical Anesthetic Applied: None Applied: None Procedures Performed: N/A N/A N/A Wound Number: 9 N/A N/A Photos: No Photos N/A N/A Wound Location: Right, Distal Achilles N/A N/A Wounding Event: Gradually Appeared N/A N/A 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 N/A N/A JULYA, ALIOTO ([id number]) Comorbid History: N/A N/A N/A Date Acquired: 06/02/2015 N/A N/A Weeks of Treatment: 23 N/A N/A Wound Status: Open N/A N/A Pending Amputation on Yes N/A N/A Presentation: Measurements L x W x D 5x2x0.1 N/A N/A (cm) Area (cm) : 7.854 N/A N/A Volume (cm) : 0.785 N/A N/A % Reduction in Area: 75.90% N/A N/A % Reduction in Volume: 92.00% N/A N/A Tunneling: N/A N/A N/A Classification: Grade 2 N/A N/A Exudate Amount: N/A N/A N/A Exudate Type: N/A N/A  N/A Exudate Color: N/A N/A N/A Wound Margin: N/A N/A N/A Granulation Amount: N/A N/A N/A Granulation Quality: N/A N/A N/A Necrotic Amount: N/A N/A N/A Exposed Structures: N/A N/A N/A Epithelialization: N/A N/A N/A Debridement: Open Wound/Selective N/A N/A ([id number]) - Selective Pre-procedure 04:15 N/A N/A Verification/Time Out Taken: Tissue Debrided: Necrotic/Eschar, Exudates N/A N/A Level: Non-Viable Tissue N/A N/A Debridement Area (sq 3 N/A N/A cm): Instrument: Curette N/A N/A Bleeding: None N/A N/A Procedural Pain: 0 N/A N/A Post Procedural Pain: 0 N/A N/A Debridement Treatment Procedure was tolerated N/A N/A Response: well Post Debridement 5x2x0.1 N/A N/A Measurements L x W x D (cm) Post Debridement 0.785 N/A N/A Volume: (cm) Periwound Skin Texture: No Abnormalities Noted N/A N/A Periwound Skin No Abnormalities Noted N/A N/A Moisture: Periwound Skin Color: No Abnormalities Noted N/A N/A Temperature: N/A N/A N/A No N/A N/A LACHELLE, RISSLER ([id number]) Tenderness on Palpation: Wound Preparation: N/A N/A N/A Procedures Performed: Cellular or Tissue Based N/A N/A Product Debridement Treatment Notes Electronic Signature(s) Signed: 05/27/2016 5:22:07 PM By: Baltazar Najjar MD Entered By: [name],  Michael on 05/27/2016 17:00:24 Abbe AmsterdamHOPKINS, Vinetta BergamoJOANN Y. ([id number]) -------------------------------------------------------------------------------- Multi-Disciplinary Care Plan Details Patient Name: Natalie RimaHOPKINS, Jakeline Y. Date of Service: 05/27/2016 3:45 PM Medical Record Patient Account Number: [id number] Number: Treating RN: Huel CoventryWoody, Kim [birth year] (82 y.o. Other Clinician: Date of Birth/Sex: Female) Treating ROBSON, MICHAEL Primary Care Physician: Rolm GalaGrandis, Heidi Physician/Extender: G Referring Physician: Charolotte CapuchinGrandis, Heidi Weeks in Treatment: 2023 Active Inactive Abuse / Safety / Falls / Self Care Management Nursing Diagnoses: Impaired physical mobility Potential  for falls Goals: Patient will remain injury free Date Initiated: 12/17/2015 Goal Status: Active Interventions: Assess fall risk on admission and as needed Notes: Necrotic Tissue Nursing Diagnoses: Impaired tissue integrity related to necrotic/devitalized tissue Knowledge deficit related to management of necrotic/devitalized tissue Goals: Necrotic/devitalized tissue will be minimized in the wound bed Date Initiated: 02/04/2016 Goal Status: Active Interventions: Assess patient pain level pre-, during and post procedure and prior to discharge Provide education on necrotic tissue and debridement process Treatment Activities: Apply topical anesthetic as ordered : 02/04/2016 Notes: Natalie RimaHOPKINS, Rhonna Y. ([id number]) Wound/Skin Impairment Nursing Diagnoses: Impaired tissue integrity Goals: Patient/caregiver will verbalize understanding of skin care regimen Date Initiated: 12/17/2015 Goal Status: Active Ulcer/skin breakdown will have a volume reduction of 30% by week 4 Date Initiated: 12/17/2015 Goal Status: Active Ulcer/skin breakdown will have a volume reduction of 50% by week 8 Date Initiated: 12/17/2015 Goal Status: Active Ulcer/skin breakdown will have a volume reduction of 80% by week 12 Date Initiated: 12/17/2015 Goal Status: Active Ulcer/skin breakdown will heal within 14 weeks Date Initiated: 12/17/2015 Goal Status: Active Interventions: Assess patient/caregiver ability to obtain necessary supplies Assess patient/caregiver ability to perform ulcer/skin care regimen upon admission and as needed Assess ulceration(s) every visit Notes: Electronic Signature(s) Signed: 05/27/2016 6:06:27 PM By: Elliot GurneyWoody, RN, BSN, Kim RN, BSN Entered By: Elliot GurneyWoody, RN, BSN, Kim on 05/27/2016 16:31:33 Natalie RimaHOPKINS, Kaytlan Y. ([id number]) -------------------------------------------------------------------------------- Pain Assessment Details Patient Name: Natalie RimaHOPKINS, Wanita Y. Date of Service: 05/27/2016 3:45  PM Medical Record Patient Account Number: [id number] Number: Treating RN: Huel CoventryWoody, Kim [birth year] (82 y.o. Other Clinician: Date of Birth/Sex: Female) Treating ROBSON, MICHAEL Primary Care Physician: Rolm GalaGrandis, Heidi Physician/Extender: G Referring Physician: Charolotte CapuchinGrandis, Heidi Weeks in Treatment: 23 Active Problems Location of Pain Severity and Description of Pain Patient Has Paino No Site Locations With Dressing Change: No Pain Management and Medication Current Pain Management: Electronic Signature(s) Signed: 05/27/2016 6:06:27 PM By: Elliot GurneyWoody, RN, BSN, Kim RN, BSN Entered By: Elliot GurneyWoody, RN, BSN, Kim on 05/27/2016 15:39:40 Natalie RimaHOPKINS, Korynne Y. ([id number]) -------------------------------------------------------------------------------- Patient/Caregiver Education Details Patient Name: Natalie RimaHOPKINS, Ameria Y. Date of Service: 05/27/2016 3:45 PM Medical Record Patient Account Number: [id number] Number: Treating RN: Huel CoventryWoody, Kim [birth year] (82 y.o. Other Clinician: Date of Birth/Gender: Female) Treating ROBSON, MICHAEL Primary Care Physician: Rolm GalaGrandis, Heidi Physician/Extender: G Referring Physician: Charolotte CapuchinGrandis, Heidi Weeks in Treatment: 2123 Education Assessment Education Provided To: Patient Education Topics Provided Wound/Skin Impairment: Handouts: Caring for Your Ulcer, Other: Skin graft applied. Keep dry and covered. Methods: Explain/Verbal Responses: State content correctly Electronic Signature(s) Signed: 05/27/2016 6:06:27 PM By: Elliot GurneyWoody, RN, BSN, Kim RN, BSN Entered By: Elliot GurneyWoody, RN, BSN, Kim on 05/27/2016 17:20:04 Natalie RimaHOPKINS, Akeira Y. ([id number]) -------------------------------------------------------------------------------- Wound Assessment Details Patient Name: Natalie RimaHOPKINS, Rashell Y. Date of Service: 05/27/2016 3:45 PM Medical Record Patient Account Number: [id number] Number: Treating RN: Huel CoventryWoody, Kim [birth year] (82 y.o. Other Clinician: Date of Birth/Sex: Female)  Treating ROBSON, MICHAEL Primary Care Physician: Rolm GalaGrandis, Heidi Physician/Extender: G Referring Physician: Charolotte CapuchinGrandis, Heidi Weeks in Treatment: 23 Wound Status Wound Number: 14 Primary Etiology: Open Surgical Wound  Wound Location: Right Amputation Site - Digit Wound Status: Healed - Epithelialized Wounding Event: Surgical Injury Date Acquired: 11/11/2015 Weeks Of Treatment: 21 Clustered Wound: No Photos Photo Uploaded By: Elliot Gurney, RN, BSN, Kim on 05/27/2016 17:57:35 Wound Measurements Length: (cm) 0 % Reducti Width: (cm) 0 % Reducti Depth: (cm) 0 Area: (cm) 0 Volume: (cm) 0 on in Area: 100% on in Volume: 100% Wound Description Full Thickness Without Exposed Classification: Support Structures Periwound Skin Texture Texture Color No Abnormalities Noted: No No Abnormalities Noted: No Moisture No Abnormalities Noted: No Electronic Signature(s) Signed: 05/27/2016 6:06:27 PM By: Elliot Gurney, RN, BSN, Kim RN, BSN [address], [place] ([id number]) Entered By: Elliot Gurney, RN, BSN, Kim on 05/27/2016 16:36:50 [name] ([id number]) -------------------------------------------------------------------------------- Wound Assessment Details Patient Name: [name]. Date of Service: 05/27/2016 3:45 PM Medical Record Patient Account Number: [id number] Number: Treating RN: Huel Coventry February 18, 1934 (82 y.o. Other Clinician: Date of Birth/Sex: Female) Treating ROBSON, MICHAEL Primary Care Physician: [name] Physician/Extender: G Referring Physician: Charolotte Capuchin in Treatment: 23 Wound Status Wound Number: 17 Primary Diabetic Wound/Ulcer of the Lower Etiology: Extremity Wound Location: Right Achilles - Proximal Secondary Arterial Insufficiency Ulcer Wounding Event: Gradually Appeared Etiology: Date Acquired: 06/03/2015 Wound Open Weeks Of Treatment: 5 Status: Clustered Wound: No Comorbid Arrhythmia, Congestive Heart History: Failure, Hypertension, Type  II Diabetes Photos Photo Uploaded By: Elliot Gurney, RN, BSN, Kim on 05/27/2016 17:57:36 Wound Measurements Length: (cm) 1 % Reduction in Width: (cm) 0.4 % Reduction in Depth: (cm) 0.4 Epithelializati Area: (cm) 0.314 Tunneling: Volume: (cm) 0.126 Position (o Maximum Dist Area: -60.2% Volume: -28.6% on: None Yes 'clock): 12 ance: (cm) 4.5 Wound Description Classification: Grade 1 Foul Odor Afte Wound Margin: Distinct, outline attached Exudate Amount: Large Exudate Type: Serosanguineous Exudate Color: red, brown r Cleansing: No Wound Bed MECKENZIE, BALSLEY ([id number]) Granulation Amount: Large (67-100%) Exposed Structure Granulation Quality: Red Fascia Exposed: No Necrotic Amount: Small (1-33%) Fat Layer Exposed: No Necrotic Quality: Adherent Slough Tendon Exposed: No Muscle Exposed: No Joint Exposed: No Bone Exposed: No Limited to Skin Breakdown Periwound Skin Texture Texture Color No Abnormalities Noted: No No Abnormalities Noted: No Moisture Temperature / Pain No Abnormalities Noted: No Temperature: No Abnormality Tenderness on Palpation: Yes Wound Preparation Ulcer Cleansing: Rinsed/Irrigated with Saline Topical Anesthetic Applied: None Treatment Notes Wound #17 (Right, Proximal Achilles) 1. Cleansed with: Clean wound with Normal Saline Notes Apligraf, x-tra sorb; ABD; kerlix and coban Electronic Signature(s) Signed: 05/27/2016 6:06:27 PM By: Elliot Gurney, RN, BSN, Kim RN, BSN Entered By: Elliot Gurney, RN, BSN, Kim on 05/27/2016 15:51:33 Britanni, Yarde Vinetta Bergamo ([id number]) -------------------------------------------------------------------------------- Wound Assessment Details Patient Name: [name]. Date of Service: 05/27/2016 3:45 PM Medical Record Patient Account Number: [id number] Number: Treating RN: Huel Coventry 12/09/1933 (82 y.o. Other Clinician: Date of Birth/Sex: Female) Treating ROBSON, MICHAEL Primary Care Physician: [name] Physician/Extender: G Referring Physician: Charolotte Capuchin in Treatment: 23 Wound Status Wound Number: 18 Primary Infection - not elsewhere classified Etiology: Wound Location: Right Abdomen - Lower Quadrant Wound Open Status: Wounding Event: Gradually Appeared Comorbid Arrhythmia, Congestive Heart Failure, Date Acquired: 05/13/2016 History: Hypertension, Type II Diabetes Weeks Of Treatment: 2 Clustered Wound: No Photos Photo Uploaded By: Elliot Gurney, RN, BSN, Kim on 05/27/2016 17:58:10 Wound Measurements Length: (cm) 0 % Reducti Width: (cm) 0 % Reducti Depth: (cm) 0 Epithelia Area: (cm) 0 Volume: (cm) 0 on in Area: 100% on in Volume: 100% lization: None Wound Description Classification: Partial Thickness Wound Margin: Distinct, outline attached Exudate Amount: Medium Exudate Type:  Serous Exudate Color: amber Foul Odor After Cleansing: No Wound Bed Granulation Amount: Medium (34-66%) Exposed Structure Granulation Quality: Red Fascia Exposed: No Necrotic Amount: Medium (34-66%) Fat Layer Exposed: No Necrotic Quality: Adherent Slough Tendon Exposed: No Natalie RimaHOPKINS, Laretta Y. ([id number]) Muscle Exposed: No Joint Exposed: No Bone Exposed: No Limited to Skin Breakdown Periwound Skin Texture Texture Color No Abnormalities Noted: No No Abnormalities Noted: No Moisture Temperature / Pain No Abnormalities Noted: No Temperature: No Abnormality Moist: Yes Tenderness on Palpation: Yes Wound Preparation Ulcer Cleansing: Rinsed/Irrigated with Saline Topical Anesthetic Applied: None Electronic Signature(s) Signed: 05/27/2016 6:06:27 PM By: Elliot GurneyWoody, RN, BSN, Kim RN, BSN Entered By: Elliot GurneyWoody, RN, BSN, Kim on 05/27/2016 15:54:25 Natalie RimaHOPKINS, Reggie Y. ([id number]) -------------------------------------------------------------------------------- Wound Assessment Details Patient Name: Natalie RimaHOPKINS, Tarina Y. Date of Service: 05/27/2016 3:45 PM Medical Record Patient Account Number:  [id number] Number: Treating RN: Huel CoventryWoody, Kim 01/08/1934 (82 y.o. Other Clinician: Date of Birth/Sex: Female) Treating ROBSON, MICHAEL Primary Care Physician: Rolm GalaGrandis, Heidi Physician/Extender: G Referring Physician: Charolotte CapuchinGrandis, Heidi Weeks in Treatment: 23 Wound Status Wound Number: 9 Primary Etiology: Diabetic Wound/Ulcer of the Lower Extremity Wound Location: Right, Distal Achilles Secondary Arterial Insufficiency Ulcer Wounding Event: Gradually Appeared Etiology: Date Acquired: 06/02/2015 Wound Status: Open Weeks Of Treatment: 23 Clustered Wound: No Pending Amputation On Presentation Photos Photo Uploaded By: Elliot GurneyWoody, RN, BSN, Kim on 05/27/2016 17:58:10 Wound Measurements Length: (cm) 5 Width: (cm) 2 Depth: (cm) 0.1 Area: (cm) 7.854 Volume: (cm) 0.785 % Reduction in Area: 75.9% % Reduction in Volume: 92% Wound Description Classification: Grade 2 Periwound Skin Texture Texture Color No Abnormalities Noted: No No Abnormalities Noted: No Moisture No Abnormalities Noted: No Treatment Notes Wound #9 (Right, Distal Achilles) Natalie RimaHOPKINS, Shawnita Y. ([id number]) 1. Cleansed with: Clean wound with Normal Saline Notes Apligraf, x-tra sorb; ABD; kerlix and coban Electronic Signature(s) Signed: 05/27/2016 6:06:27 PM By: Elliot GurneyWoody, RN, BSN, Kim RN, BSN Entered By: Elliot GurneyWoody, RN, BSN, Kim on 05/27/2016 15:51:50 Natalie RimaHOPKINS, Aymar Y. ([id number]) -------------------------------------------------------------------------------- Vitals Details Patient Name: Natalie RimaHOPKINS, Aleshia Y. Date of Service: 05/27/2016 3:45 PM Medical Record Patient Account Number: [id number] Number: Treating RN: Huel CoventryWoody, Kim 01/08/1934 (82 y.o. Other Clinician: Date of Birth/Sex: Female) Treating ROBSON, MICHAEL Primary Care Physician: Rolm GalaGrandis, Heidi Physician/Extender: G Referring Physician: Charolotte CapuchinGrandis, Heidi Weeks in Treatment: 23 Vital Signs Time Taken: 15:40 Pulse (bpm): 85 Height (in): 65 Respiratory  Rate (breaths/min): 16 Weight (lbs): 160 Blood Pressure (mmHg): 108/48 Body Mass Index (BMI): 26.6 Reference Range: 80 - 120 mg / dl Notes Patient states BP runs low at times. She is alert, oriented and asymptomatic. Electronic Signature(s) Signed: 05/27/2016 6:06:27 PM By: Elliot GurneyWoody, RN, BSN, Kim RN, BSN Entered By: Elliot GurneyWoody, RN, BSN, Kim on 05/27/2016 15:40:47

## 2016-05-28 NOTE — Progress Notes (Signed)
CHARLCIE, [name] ([id number]) Visit Report for 05/27/2016 Chief Complaint Document Details Patient Name: [name], [name]. Date of Service: 05/27/2016 3:45 PM Medical Record Patient Account Number: [id number] Number: Treating RN: Huel Coventry 1934/04/12 (82 [name] Other Clinician: Date of Birth/Sex: Female) Treating Brice Potteiger Primary Care Physician/Extender: Rex Kras, Heidi Physician: Referring Physician: Charolotte Capuchin in Treatment: 23 Information Obtained from: Patient Chief Complaint Ms. Okane presents today for follow-up evaluation of her diabetic foot ulcers and abdominal wound. Electronic Signature(s) Signed: 05/27/2016 5:22:07 PM By: Baltazar Najjar MD Entered By: Baltazar Najjar on 05/27/2016 17:01:23 [name] ([id number]) -------------------------------------------------------------------------------- Cellular or Tissue Based Product Details Patient Name: [name], [name]. Date of Service: 05/27/2016 3:45 PM Medical Record Patient Account Number: [id number] Number: Treating RN: Huel Coventry 02/16/1934 (82 [name] Other Clinician: Date of Birth/Sex: Female) Treating Virat Prather Primary Care Physician/Extender: Rex Kras, Heidi Physician: Referring Physician: Charolotte Capuchin in Treatment: 23 Cellular or Tissue Based Wound #9 Right,Distal Achilles Product Type Applied to: Performed By: Physician Maxwell Caul, MD Cellular or Tissue Based Apligraf Product Type: Pre-procedure Yes - 04:18 Verification/Time Out Taken: Location: trunk / arms / legs Wound Size (sq cm): 10 Product Size (sq cm): 44 Waste Size (sq cm): 34 Waste Reason: wound size Amount of Product Applied (sq cm): 10 Lot #: [id number] Expiration Date: 06/04/2016 Fenestrated: Yes Instrument: Blade Reconstituted: No Secured: Yes Secured With: Steri-Strips Dressing Applied: Yes Primary Dressing: x-sorb Procedural Pain: 0 Post Procedural Pain:  0 Response to Treatment: Procedure was tolerated well Post Procedure Diagnosis Same as Pre-procedure Electronic Signature(s) Signed: 05/27/2016 5:22:07 PM By: Baltazar Najjar MD Entered By: Baltazar Najjar on 05/27/2016 17:01:12 [name] ([id number]) -------------------------------------------------------------------------------- Debridement Details Patient Name: [name] Date of Service: 05/27/2016 3:45 PM Medical Record Patient Account Number: [id number] Number: Treating RN: Huel Coventry [birth year] (82 [name] Other Clinician: Date of Birth/Sex: Female) Treating Elvia Aydin Primary Care Physician/Extender: Rex Kras, Heidi Physician: Referring Physician: Charolotte Capuchin in Treatment: 23 Debridement Performed for Wound #9 Right,Distal Achilles Assessment: Performed By: Physician Maxwell Caul, MD Debridement: Debridement Pre-procedure Yes - 04:15 Verification/Time Out Taken: Start Time: 04:16 Total Area Debrided (L x 3 (cm) x 1 (cm) = 3 (cm) W): Tissue and other Eschar, Exudate, Fibrin/Slough, Subcutaneous material debrided: Instrument: Curette Bleeding: None End Time: 04:18 Procedural Pain: 0 Post Procedural Pain: 0 Response to Treatment: Procedure was tolerated well Post Debridement Measurements of Total Wound Length: (cm) 5 Width: (cm) 2 Depth: (cm) 0.1 Volume: (cm) 0.785 Character of Wound/Ulcer Post Requires Further Debridement Debridement: Severity of Tissue Post Debridement: Fat layer exposed Post Procedure Diagnosis Same as Pre-procedure Electronic Signature(s) Signed: 05/27/2016 5:22:07 PM By: Baltazar Najjar MD Signed: 05/27/2016 6:06:27 PM By: Elliot Gurney RN, BSN, Kim RN, BSN Entered By: Baltazar Najjar on 05/27/2016 17:01:00 [name], [name] ([id number]) [name], [name] ([id number]) -------------------------------------------------------------------------------- HPI Details Patient Name: [name], [name]. Date  of Service: 05/27/2016 3:45 PM Medical Record Patient Account Number: [id number] Number: Treating RN: Huel Coventry 04-29-1934 (82 [name] Other Clinician: Date of Birth/Sex: Female) Treating Modest Draeger Primary Care Physician/Extender: Rex Kras, Heidi Physician: Referring Physician: Charolotte Capuchin in Treatment: 23 History of Present Illness Location: right posterior heel, right Achilles, right lower quadrant abdomen, right fourth toe amp site Quality: denies pain to any wound Severity: not applicable Timing: denies pain HPI Description: 82 year old patient who is known to be diabetic, was referred to [place] by Dr. Gavin Potters for a right heel ulceration which she's  had for a while. She was recently in hospital for a pneumonia and at that time and got delirious and was disoriented and sometime during this time developed a stage II ulcer on her right heel. Her past medical history is significant for bilateral pneumonia which was treated with injectable antibiotics and then to oral Levaquin which he has completed. She also has acute on chronic diastolic CHF, acute on chronic respiratory failure, end-stage renal disease on hemodialysis, atrial fibrillation, recent stroke, diabetes mellitus. The patient and her son are poor historians but from what I understand she was admitted to the hospital with an acute vascular compromise of her right lower extremity and Dr. Wyn Quaker has done a surgical procedure and we are trying to obtain these notes. There are also some vascular workup done and we will try and obtain these notes. the injury to the left lower quadrant of abdomen and the suprapubic area have been there due to a bruise and have been there for several months and no intervention has been done. 10/11/2015 -- on review of the electronics records it was noted that the patient was admitted to the hospital on 09/14/2015 with peripheral vascular disease with claudication, end-stage renal  disease, pressure ulcer, chronic atrial fibrillation. She was seen by Dr. Wyn Quaker who did her right lower extremity angiogram , angioplasty of the right anterior tibial artery and thrombolysis with TPA of the right popliteal artery, and thrombectomy. She was seen by Dr. Wyn Quaker during this past week and he was pleased with the progress. He did say that if he took her to the operating room for any procedure he would debride the abdominal wound under anesthesia. She was also seen by Dr. Ether Griffins the podiatrist who thought that she may lose her right fourth toe at some stage may need an amputation of this. 10/21/2015 --patient known to Dr. Wyn Quaker and his last office visit from 10/04/2015 has been reviewed. 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 She also had a persistent ulceration of the right heel and markedly this area and a right third and fourth toe and a small scab on the calf but these are dry and seemed to be improving. Patient also has a left carotid endarterectomy and multiple interventions to a right brachiocephalic AV fistula. After the visit he had recommended noninvasive studies to recheck her revascularization. [name], [name] ([id number]) He was off the impression that she would likely lose the right fourth toe and the third toe was likely to heal. He was concerned about underlying muscle necrosis on her right heel and midfoot. 11/01/2015 -- an echo done in January of this year showed her left ventricular ejection fraction to be about 50-55%. The patient was seen by the PA and Dr. Driscilla Grammes office and the plan was to take her to the operating room soon to have a debridement under anesthesia for the abdominal wall wound, the Achilles tendon on the right leg and amputation of the right fourth toe. The daughter and the patient do not feel that they would be able to undergo  hyperbaric oxygen therapy 5 days a week for 6 weeks. 12/17/15; this is a medically complex woman who I note was recently in this clinic however I was not involved with her care. She returns today with multiple wounds; a) she has a wound in the mid abdomen that is been there since March of this year. I note  that she is been to the overall for debridement recently. The exact etiology of this wound is not really clear b) left lower quadrant abdominal wound had some sanguinous drainage when she came in here. The patient fell in January and thinks this may have been secondary to a hematoma. c): The patient has 3 wounds on her right leg including a small wound on the right mid calf, a large area over the Achilles which currently has a wound VAC for the last 6 weeks, also a smaller wound on the distal part of the right heel. As far as I understand most of these wounds are currently been dressed with's calcium alginate. According to her daughter the Achilles wound under the wound VAC is doing well d) the patient is had an amputation of her left fifth toe in January and the right fourth toe 6 weeks ago secondary to diabetic PAD e) the patient has chronic renal failure on dialysis for the last 2 years secondary to type 2 diabetes on insulin. The daughter's knowledge there is been no biopsy of the abdominal wounds given their current appearance and lack of undefined etiology at have to wonder about calciphylaxis. 12/18/15:Addendum; I have reviewed [facility] I can see no relevant x-rays of the right heel. I note her arteriogram and revascularization of her right lower extremity in April 2017. She had debridement of both abdominal wounds and the right heel and Achilles wound on 11/07/15 at which time she had a right fourth toe ray amputation. The abdominal wounds were debridement again on 6/29. I do not see any relevant pathology of these abdominal wounds 12/24/15; culture I did of the drainage from the  midline abdominal wound last week showed both Proteus and ampicillin sensitive enterococcus. I've given her a course of Augmentin adjusted on dialysis days. She has no specific complaints today. Been using Santyl to the abdominal wounds in the right leg wound and the wound VAC on the right Achilles which was initially prescribed by Dr. dew 12/31/15; I have done two punch biopsies of the large midline abdominal. 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 May have been a trauma component of the one on the left lower quadrant however the midline wound had no such history. She has a large area on the right Achilles heel with a wound VAC prescribed by Dr. dew. A small wound on the right anterior leg.Marland Kitchen UNFORTUNATELY she has 2 new wounds today. One on the left heel which is probably a pressure area. As well her previous amputation site of her right fourth toe has dehisced and now has a small wound with significant depth at the amputation site. 01/14/2016 -- she returns after 2 weeks and had had a punch biopsy of abdominal wound done the last visit -- had a biopsy of her midline abdominal wound done and the Pathology diagnosis is that of ulceration, necrosis and inflammation and negative for dysplasia and malignancy. 01/21/16. 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 In the meantime she has new wounds of the left heel the right fourth toe amputation site is opened up. The back is stopped to the right heel area. 01/28/16; the abdominal wounds continued to improve. The extensive wound on her right Achilles also looks stable except for the lower aspect of the wound where there is a large liquefied area that probes right down [name], [name] ([id number]) to her calcaneus. This cultured Proteus last week I have her on Augmentin  and doxycycline 1. I think this is going to need a course of IV antibiotics and I will call dialysis. X-ray I did last  week was negative, I think she is going to need an MRI 02/04/16; right heel MRI as before Saturday. Receiving I believe IV Rocephin at dialysis 02/11/16; as it turns out the patient could not have a MRI as she has a bladder stimulator in place even though it is not currently in use since the beginning of this year. Although she has an allergy to IV contrast she apparently has done well with premedication so we will have to go for a CT scan with contrast. In the meantime she has had a fall now has a large skin tear on her left upper arm. She went to the ER and they suggested Tegaderm over topical antibiotics 03/03/16 currently patient returns after having been hospitalized for 2 weeks and subsequently transferred to [facility] nursing facility. She actually seems to be doing excellent compared to even when we last saw her according to our nursing staff. Both patient and her daughter are extremely encouraged at how well she is presenting at this point in time. Overall the biggest issue is still the right Achilles area which is being managed at this point in time by Dr. Wyn Quaker. Patient is currently utilizing a wound VAC to that region. 03/17/16; patient is at [facility] nursing home still. Using Aquacel Ag to the wounds on her bilateral feet and still Prisma to the small open area on her abdomen. 03/31/16 at this point in time patient has been tolerating the dressing changes currently. She fortunately has no worsening of her symptoms although she tells me that the nurse who is caring for her at [facility] nursing facility decided that nothing was needed in regard to the lower abdominal wound from a dressing standpoint at this time. she is really not having significant discomfort or pain at this point she continues to have some tunneling in the proximal Achilles wound region. 04/07/16 patient continues to do well on evaluation today. Even the Achilles wound which has been the most tender is not giving her as much  trouble. Unfortunately the PolyMem dressings that we utilize last week really did not seem to benefit her in particular. Obviously we will discontinue that at this point in time today. 04-15-16:Ms. Fiorentino is accompanied today by her daughter. She is still residing in an SNF undergoing dialysis, continues to receive antibiotics during dialysis as prescribed by Dr. Sampson Goon of infectious disease. She has a follow-up appointment with Dr. Sampson Goon on 04-17-16 to discuss the continuation of these antibiotics. Ms. Driskill denies any pain to any of the 3 remaining wounds she does admit to changing to [employer] surgical shoes for safety while ambulating with physical therapy. She denies any falls since her last appointment here although she and her daughter do admit to increased tremors of unclear etiology since her last appointment. She has tolerated the dressing changes that were prescribed last week. 04-22-16 Ms. Bundren presents today accompanied by her daughter for evaluation of her diabetic foot ulcers. She is still residing in an SNF, she continues dialysis 3 times weekly. She'll follow up with Dr. Sampson Goon last Friday, and at that appointment IV antibiotics were discontinued. According to Ms. Bilal and her daughter if there is any regression of her wounds Dr. Sampson Goon will consider re-starting the antibiotics. Ms. Leyendecker daughter states that since the discontinuation of antibiotic therapy her "twitches" have resolved. 04/29/16; I have not seen this  patient in quite some time however she is completed triple antibiotic therapy given at dialysis for osteomyelitis as prescribed by infectious disease. She is still being followed by Dr. dew of vascular surgery. We are using Hydrofera Blue to the surface of these wounds. She currently has 2 open areas a substantial area over her Achilles area although this is a lot better than the last time I saw this. She also has a small wound superiorly from  this wound which has a superior probing depth of 2 cm. 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 My biopsy did not prove this nevertheless I think this is the correct clinical diagnosis. We are [name], Natalie Y. ([id number]) now following her for an area on the right Achilles part of her heel. This is not any different from last week. She also has a small tunneling area just above this. And unfortunately this week there is been a reopening of an area where her right fourth toe was previously amputated. She is completed antibiotics 05/13/16; she has a new reopening on the mid abdominal wound in the same site is previously. This is a small open area. Apligraf #1 today to the areas on the right heel o2 still an open area in the fourth toe amputation site 05/27/16; the areas on her abdomen are completely closed over. Small open area from last time is closed. Apligraf #2 today to the areas on the right heel. The 4th toe amputation site is also heel Electronic Signature(s) Signed: 05/27/2016 5:22:07 PM By: Baltazar Najjarobson, Rashad Auld MD Entered By: Baltazar Najjarobson, Leo Weyandt on 05/27/2016 17:03:07 [name], Darnisha Y. ([id number]) -------------------------------------------------------------------------------- Physical Exam Details Patient Name: [name], Ingrid Y. Date of Service: 05/27/2016 3:45 PM Medical Record Patient Account Number: [id number] Number: Treating RN: Huel CoventryWoody, Kim Jul 31, 1933 (82 [name] Other Clinician: Date of Birth/Sex: Female) Treating Rayce Brahmbhatt Primary Care Physician/Extender: Rex KrasG Grandis, Heidi Physician: Referring Physician: Charolotte CapuchinGrandis, Heidi Weeks in Treatment: 23 Constitutional Sitting or standing Blood Pressure is within target range for patient.. Pulse regular  and within target range for patient.Marland Kitchen. Respirations regular, non-labored and within target range.. Temperature is normal and within the target range for the patient.. Patient's appearance is neat and clean. Appears in no acute distress. Well nourished and well developed.. Notes Wound exam; the area over the Achilles looks considerably better. The area is debrided with a #3 curet of surface slough nonviable subcutaneous tissue and callus around the inferior part of the wound. The open areas superiorly to this did not require debridement. Electronic Signature(s) Signed: 05/27/2016 5:22:07 PM By: Baltazar Najjarobson, Shandell Jallow MD Entered By: Baltazar Najjarobson, Bora Broner on 05/27/2016 17:04:18 [name], Rylen Y. ([id number]) -------------------------------------------------------------------------------- Physician Orders Details Patient Name: [name], Harlea Y. Date of Service: 05/27/2016 3:45 PM Medical Record Patient Account Number: [id number] Number: Treating RN: Huel CoventryWoody, Kim Jul 31, 1933 (82 [name] Other Clinician: Date of Birth/Sex: Female) Treating Ryanna Teschner Primary Care Physician/Extender: Rex KrasG Grandis, Heidi Physician: Referring Physician: Charolotte CapuchinGrandis, Heidi Weeks in Treatment: 6523 Verbal / Phone Orders: No Diagnosis Coding Wound Cleansing Wound #17 Right,Proximal Achilles o Clean wound with Normal Saline. Wound #9 Right,Distal Achilles o Clean wound with Normal Saline. Anesthetic Wound #17 Right,Proximal Achilles o Topical Lidocaine 4% cream applied to wound bed prior to debridement - all wounds for clinic use only Wound #9 Right,Distal Achilles o Topical Lidocaine 4% cream  applied to wound bed prior to debridement - all wounds for clinic use only Primary Wound Dressing Wound #17 Right,Proximal Achilles o XtraSorb Wound #9 Right,Distal Achilles o XtraSorb Secondary Dressing Wound #17 Right,Proximal Achilles o ABD pad Wound #9 Right,Distal Achilles o ABD pad Dressing Change  Frequency Wound #17 Right,Proximal Achilles o Change dressing every week - Only at the wound care clinic REYAH, STREETER ([id number]) Wound #9 Right,Distal Achilles o Change dressing every week - Only at the wound care clinic Follow-up Appointments Wound #17 Right,Proximal Achilles o Return Appointment in 1 week. - nurse visit o Return Appointment in 2 weeks. - MD visit Wound #9 Right,Distal Achilles o Return Appointment in 1 week. - nurse visit o Return Appointment in 2 weeks. - MD visit Edema Control Wound #17 Right,Proximal Achilles o Kerlix and Coban - Right Lower Extremity o Elevate legs to the level of the heart and pump ankles as often as possible Wound #9 Right,Distal Achilles o Kerlix and Coban - Right Lower Extremity o Elevate legs to the level of the heart and pump ankles as often as possible Off-Loading Wound #17 Right,Proximal Achilles o Other: - Float heels Wound #9 Right,Distal Achilles o Other: - Float heels Advanced Therapies Wound #17 Right,Proximal Achilles o Apligraf application in clinic; including contact layer, fixation with steri strips, dry gauze and cover dressing. Wound #9 Right,Distal Achilles o Apligraf application in clinic; including contact layer, fixation with steri strips, dry gauze and cover dressing. Electronic Signature(s) Signed: 05/27/2016 5:22:07 PM By: Baltazar Najjar MD Signed: 05/27/2016 6:06:27 PM By: Elliot Gurney RN, BSN, Kim RN, BSN Entered By: Elliot Gurney, RN, BSN, Kim on 05/27/2016 16:38:58 Zhara, Gieske Vinetta Bergamo ([id number]) -------------------------------------------------------------------------------- Problem List Details Patient Name: SHONIKA, KOLASINSKI. Date of Service: 05/27/2016 3:45 PM Medical Record Patient Account Number: [id number] Number: Treating RN: Huel Coventry May 02, 1934 (82 [name] Other Clinician: Date of Birth/Sex: Female) Treating Mayu Ronk Primary Care Physician/Extender:  Rex Kras, Heidi Physician: Referring Physician: Charolotte Capuchin in Treatment: 23 Active Problems ICD-10 Encounter Code Description Active Date Diagnosis E11.621 Type 2 diabetes mellitus with foot ulcer 12/17/2015 Yes L97.512 Non-pressure chronic ulcer of other part of right foot with 03/31/2016 Yes fat layer exposed E11.51 Type 2 diabetes mellitus with diabetic peripheral 12/17/2015 Yes angiopathy without gangrene Inactive Problems ICD-10 Code Description Active Date Inactive Date M86.671 Other chronic osteomyelitis, right ankle and foot 04/15/2016 04/15/2016 S31.104A Unspecified open wound of abdominal wall, left lower 12/17/2015 12/17/2015 quadrant without penetration into peritoneal cavity, initial encounter S31.103D Unspecified open wound of abdominal wall, right lower 04/15/2016 04/15/2016 quadrant without penetration into peritoneal cavity, subsequent encounter Resolved Problems KARALYNN, COTTONE ([id number]) Electronic Signature(s) Signed: 05/27/2016 5:22:07 PM By: Baltazar Najjar MD Entered By: Baltazar Najjar on 05/27/2016 16:57:26 Iser, Vinetta Bergamo ([id number]) -------------------------------------------------------------------------------- Progress Note Details Patient Name: [name] Date of Service: 05/27/2016 3:45 PM Medical Record Patient Account Number: [id number] Number: Treating RN: Huel Coventry 1933/09/08 (82 [name] Other Clinician: Date of Birth/Sex: Female) Treating Viviane Semidey Primary Care Physician/Extender: Rex Kras, Heidi Physician: Referring Physician: Charolotte Capuchin in Treatment: 23 Subjective Chief Complaint Information obtained from Patient Ms. Lawyer presents today for follow-up evaluation of her diabetic foot ulcers and abdominal wound. History of Present Illness (HPI) The following HPI elements were documented for the patient's wound: Location: right posterior heel, right Achilles, right lower quadrant  abdomen, right fourth toe amp site Quality: denies pain to any wound Severity: not applicable Timing: denies pain 82 year old patient who is known to be diabetic,  was referred to [place] by Dr. Gavin Potters for a right heel ulceration which she's had for a while. She was recently in hospital for a pneumonia and at that time and got delirious and was disoriented and sometime during this time developed a stage II ulcer on her right heel. Her past medical history is significant for bilateral pneumonia which was treated with injectable antibiotics and then to oral Levaquin which he has completed. She also has acute on chronic diastolic CHF, acute on chronic respiratory failure, end-stage renal disease on hemodialysis, atrial fibrillation, recent stroke, diabetes mellitus. The patient and her son are poor historians but from what I understand she was admitted to the hospital with an acute vascular compromise of her right lower extremity and Dr. Wyn Quaker has done a surgical procedure and we are trying to obtain these notes. There are also some vascular workup done and we will try and obtain these notes. the injury to the left lower quadrant of abdomen and the suprapubic area have been there due to a bruise and have been there for several months and no intervention has been done. 10/11/2015 -- on review of the electronics records it was noted that the patient was admitted to the hospital on 09/14/2015 with peripheral vascular disease with claudication, end-stage renal disease, pressure ulcer, chronic atrial fibrillation. She was seen by Dr. Wyn Quaker who did her right lower extremity angiogram , angioplasty of the right anterior tibial artery and thrombolysis with TPA of the right popliteal artery, and thrombectomy. She was seen by Dr. Wyn Quaker during this past week and he was pleased with the progress. He did say that if he took her to the operating room for any procedure he would debride the abdominal wound under  anesthesia. She was also seen by Dr. Ether Griffins the podiatrist who thought that she may lose her right fourth toe at some stage may need an amputation of this. CRYSTIN, LECHTENBERG ([id number]) 10/21/2015 --patient known to Dr. Wyn Quaker and his last office visit from 10/04/2015 has been reviewed. 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 She also had a persistent ulceration of the right heel and markedly this area and a right third and fourth toe and a small scab on the calf but these are dry and seemed to be improving. Patient also has a left carotid endarterectomy and multiple interventions to a right brachiocephalic AV fistula. After the visit he had recommended noninvasive studies to recheck her revascularization. He was off the impression that she would likely lose the right fourth toe and the third toe was likely to heal. He was concerned about underlying muscle necrosis on her right heel and midfoot. 11/01/2015 -- an echo done in January of this year showed her left ventricular ejection fraction to be about 50-55%. The patient was seen by the PA and Dr. Driscilla Grammes office and the plan was to take her to the operating room soon to have a debridement under anesthesia for the abdominal wall wound, the Achilles tendon on the right leg and amputation of the right fourth toe. The daughter and the patient do not feel that they would be able to undergo hyperbaric oxygen therapy 5 days a week for 6 weeks. 12/17/15; this is a medically complex woman who I note was recently in this clinic however I was not involved with her care. She returns today with multiple wounds; a) she has a wound in the  mid abdomen that is been there since March of this year. I note that she is been to the overall for debridement recently. The exact etiology of this wound is not really clear b) left lower quadrant  abdominal wound had some sanguinous drainage when she came in here. The patient fell in January and thinks this may have been secondary to a hematoma. c): The patient has 3 wounds on her right leg including a small wound on the right mid calf, a large area over the Achilles which currently has a wound VAC for the last 6 weeks, also a smaller wound on the distal part of the right heel. As far as I understand most of these wounds are currently been dressed with's calcium alginate. According to her daughter the Achilles wound under the wound VAC is doing well d) the patient is had an amputation of her left fifth toe in January and the right fourth toe 6 weeks ago secondary to diabetic PAD e) the patient has chronic renal failure on dialysis for the last 2 years secondary to type 2 diabetes on insulin. The daughter's knowledge there is been no biopsy of the abdominal wounds given their current appearance and lack of undefined etiology at have to wonder about calciphylaxis. 12/18/15:Addendum; I have reviewed [facility] I can see no relevant x-rays of the right heel. I note her arteriogram and revascularization of her right lower extremity in April 2017. She had debridement of both abdominal wounds and the right heel and Achilles wound on 11/07/15 at which time she had a right fourth toe ray amputation. The abdominal wounds were debridement again on 6/29. I do not see any relevant pathology of these abdominal wounds 12/24/15; culture I did of the drainage from the midline abdominal wound last week showed both Proteus and ampicillin sensitive enterococcus. I've given her a course of Augmentin adjusted on dialysis days. She has no specific complaints today. Been using Santyl to the abdominal wounds in the right leg wound and the wound VAC on the right Achilles which was initially prescribed by Dr. dew 12/31/15; I have done two punch biopsies of the large midline abdominal. 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 May have been a trauma component of the one on the left lower quadrant however the midline wound had no such history. She has a large area on the right Achilles heel with a wound VAC prescribed by Dr. dew. A small wound on the right anterior leg.Marland Kitchen UNFORTUNATELY she has 2 new wounds today. One on the left heel which is probably a pressure area. As well her previous amputation site of her right fourth toe has dehisced and now has a small wound with significant depth at the amputation site. WILLO, YOON ([id number]) 01/14/2016 -- she returns after 2 weeks and had had a punch biopsy of abdominal wound done the last visit -- had a biopsy of her midline abdominal wound done and the Pathology diagnosis is that of ulceration, necrosis and inflammation and negative for dysplasia and malignancy. 01/21/16. 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 In the meantime she has new wounds of the left heel the right fourth toe amputation site is opened up. The back is stopped to the right heel area. 01/28/16; the abdominal wounds continued to improve. The extensive wound on her right Achilles also looks stable except for the lower aspect of the wound where there is a large liquefied area that probes right  down to her calcaneus. This cultured Proteus last week I have her on Augmentin and doxycycline 1. I think this is going to need a course of IV antibiotics and I will call dialysis. X-ray I did last week was negative, I think she is going to need an MRI 02/04/16; right heel MRI as before Saturday. Receiving I believe IV Rocephin at dialysis 02/11/16; as it turns out the patient could not have a MRI as she has a bladder stimulator in place even though it is not currently in use since the beginning of this year. Although she has an allergy to IV contrast she apparently has done well with premedication so we will have to go for a  CT scan with contrast. In the meantime she has had a fall now has a large skin tear on her left upper arm. She went to the ER and they suggested Tegaderm over topical antibiotics 03/03/16 currently patient returns after having been hospitalized for 2 weeks and subsequently transferred to [facility] nursing facility. She actually seems to be doing excellent compared to even when we last saw her according to our nursing staff. Both patient and her daughter are extremely encouraged at how well she is presenting at this point in time. Overall the biggest issue is still the right Achilles area which is being managed at this point in time by Dr. Wyn Quaker. Patient is currently utilizing a wound VAC to that region. 03/17/16; patient is at [facility] nursing home still. Using Aquacel Ag to the wounds on her bilateral feet and still Prisma to the small open area on her abdomen. 03/31/16 at this point in time patient has been tolerating the dressing changes currently. She fortunately has no worsening of her symptoms although she tells me that the nurse who is caring for her at [facility] nursing facility decided that nothing was needed in regard to the lower abdominal wound from a dressing standpoint at this time. she is really not having significant discomfort or pain at this point she continues to have some tunneling in the proximal Achilles wound region. 04/07/16 patient continues to do well on evaluation today. Even the Achilles wound which has been the most tender is not giving her as much trouble. Unfortunately the PolyMem dressings that we utilize last week really did not seem to benefit her in particular. Obviously we will discontinue that at this point in time today. 04-15-16:Ms. Kerman is accompanied today by her daughter. She is still residing in an SNF undergoing dialysis, continues to receive antibiotics during dialysis as prescribed by Dr. Sampson Goon of infectious disease. She has a follow-up appointment  with Dr. Sampson Goon on 04-17-16 to discuss the continuation of these antibiotics. Ms. Counts denies any pain to any of the 3 remaining wounds she does admit to changing to [employer] surgical shoes for safety while ambulating with physical therapy. She denies any falls since her last appointment here although she and her daughter do admit to increased tremors of unclear etiology since her last appointment. She has tolerated the dressing changes that were prescribed last week. 04-22-16 Ms. Fitzsimmons presents today accompanied by her daughter for evaluation of her diabetic foot ulcers. She is still residing in an SNF, she continues dialysis 3 times weekly. She'll follow up with Dr. Sampson Goon last Friday, and at that appointment IV antibiotics were discontinued. According to Ms. Reish and her daughter if there is any regression of her wounds Dr. Sampson Goon will consider re-starting the antibiotics. Ms. Dewilde daughter states that since the  discontinuation of antibiotic therapy her "twitches" have resolved. 04/29/16; I have not seen this patient in quite some time however she is completed triple antibiotic therapy SAMAYA, BOARDLEY ([id number]) given at dialysis for osteomyelitis as prescribed by infectious disease. She is still being followed by Dr. dew of vascular surgery. We are using Hydrofera Blue to the surface of these wounds. She currently has 2 open areas a substantial area over her Achilles area although this is a lot better than the last time I saw this. She also has a small wound superiorly from this wound which has a superior probing depth of 2 cm. 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 My biopsy did not prove this nevertheless I think this is the correct clinical  diagnosis. We are now following her for an area on the right Achilles part of her heel. This is not any different from last week. She also has a small tunneling area just above this. And unfortunately this week there is been a reopening of an area where her right fourth toe was previously amputated. She is completed antibiotics 05/13/16; she has a new reopening on the mid abdominal wound in the same site is previously. This is a small open area. Apligraf #1 today to the areas on the right heel o2 still an open area in the fourth toe amputation site 05/27/16; the areas on her abdomen are completely closed over. Small open area from last time is closed. Apligraf #2 today to the areas on the right heel. The 4th toe amputation site is also heel Objective Constitutional Sitting or standing Blood Pressure is within target range for patient.. Pulse regular and within target range for patient.Marland Kitchen Respirations regular, non-labored and within target range.. Temperature is normal and within the target range for the patient.. Patient's appearance is neat and clean. Appears in no acute distress. Well nourished and well developed.. Vitals Time Taken: 3:40 PM, Height: 65 in, Weight: 160 lbs, BMI: 26.6, Pulse: 85 bpm, Respiratory Rate: 16 breaths/min, Blood Pressure: 108/48 mmHg. General Notes: Patient states BP runs low at times. She is alert, oriented and asymptomatic. General Notes: Wound exam; the area over the Achilles looks considerably better. The area is debrided with a #3 curet of surface slough nonviable subcutaneous tissue and callus around the inferior part of the wound. The open areas superiorly to this did not require debridement. Integumentary (Hair, Skin) Wound #14 status is Healed - Epithelialized. Original cause of wound was Surgical Injury. The wound is located on the Right Amputation Site - Digit. The wound measures 0cm length x 0cm width x 0cm depth; 0cm^2 area and 0cm^3 volume. Wound  #17 status is Open. Original cause of wound was Gradually Appeared. The wound is located on the Right,Proximal Achilles. The wound measures 1cm length x 0.4cm width x 0.4cm depth; 0.314cm^2 area and 0.126cm^3 volume. The wound is limited to skin breakdown. There is tunneling at 12:00 with a maximum distance of 4.5cm. There is a large amount of serosanguineous drainage noted. The wound margin is distinct with the outline attached to the wound base. There is large (67-100%) red granulation DENELL, COTHERN. ([id number]) within the wound bed. There is a small (1-33%) amount of necrotic tissue within the wound bed including Adherent Slough. Periwound temperature was noted as No Abnormality.  The periwound has tenderness on palpation. Wound #18 status is Open. Original cause of wound was Gradually Appeared. The wound is located on the Right Abdomen - Lower Quadrant. The wound measures 0cm length x 0cm width x 0cm depth; 0cm^2 area and 0cm^3 volume. The wound is limited to skin breakdown. There is a medium amount of serous drainage noted. The wound margin is distinct with the outline attached to the wound base. There is medium (34-66%) red granulation within the wound bed. There is a medium (34-66%) amount of necrotic tissue within the wound bed including Adherent Slough. The periwound skin appearance exhibited: Moist. Periwound temperature was noted as No Abnormality. The periwound has tenderness on palpation. Wound #9 status is Open. Original cause of wound was Gradually Appeared. The wound is located on the Right,Distal Achilles. The wound measures 5cm length x 2cm width x 0.1cm depth; 7.854cm^2 area and 0.785cm^3 volume. Assessment Active Problems ICD-10 E11.621 - Type 2 diabetes mellitus with foot ulcer L97.512 - Non-pressure chronic ulcer of other part of right foot with fat layer exposed E11.51 - Type 2 diabetes mellitus with diabetic peripheral angiopathy without gangrene Procedures Wound  #9 Wound #9 is a Diabetic Wound/Ulcer of the Lower Extremity located on the Right,Distal Achilles . There was a Debridement [id number]) debridement with total area of 3 sq cm performed by Maxwell CaulOBSON, Parthena Fergeson G, MD. with the following instrument(s): Curette including Exudate, Fibrin/Slough, Eschar, and Subcutaneous. A time out was conducted at 04:15, prior to the start of the procedure. There was no bleeding. The procedure was tolerated well with a pain level of 0 throughout and a pain level of 0 following the procedure. Post Debridement Measurements: 5cm length x 2cm width x 0.1cm depth; 0.785cm^3 volume. Character of Wound/Ulcer Post Debridement requires further debridement. Severity of Tissue Post Debridement is: Fat layer exposed. Post procedure Diagnosis Wound #9: Same as Pre-Procedure Wound #9 is a Diabetic Wound/Ulcer of the Lower Extremity located on the Right,Distal Achilles. A skin graft procedure using a bioengineered skin substitute/cellular or tissue based product was performed by [name], Annalina Y. ([id number]) Maxwell CaulOBSON, Petr Bontempo G, MD. Apligraf was applied and secured with Steri-Strips. 10 sq cm of product was utilized and 34 sq cm was wasted due to wound size. Post Application, x-sorb was applied. A Time Out was conducted at 04:18, prior to the start of the procedure. The procedure was tolerated well with a pain level of 0 throughout and a pain level of 0 following the procedure. Post procedure Diagnosis Wound #9: Same as Pre-Procedure . Plan Wound Cleansing: Wound #17 Right,Proximal Achilles: Clean wound with Normal Saline. Wound #9 Right,Distal Achilles: Clean wound with Normal Saline. Anesthetic: Wound #17 Right,Proximal Achilles: Topical Lidocaine 4% cream applied to wound bed prior to debridement - all wounds for clinic use only Wound #9 Right,Distal Achilles: Topical Lidocaine 4% cream applied to wound bed prior to debridement - all wounds for clinic use only Primary  Wound Dressing: Wound #17 Right,Proximal Achilles: XtraSorb Wound #9 Right,Distal Achilles: XtraSorb Secondary Dressing: Wound #17 Right,Proximal Achilles: ABD pad Wound #9 Right,Distal Achilles: ABD pad Dressing Change Frequency: Wound #17 Right,Proximal Achilles: Change dressing every week - Only at the wound care clinic Wound #9 Right,Distal Achilles: Change dressing every week - Only at the wound care clinic Follow-up Appointments: Wound #17 Right,Proximal Achilles: Return Appointment in 1 week. - nurse visit Return Appointment in 2 weeks. - MD visit Wound #9 Right,Distal Achilles: Return Appointment in 1 week. - nurse visit Return Appointment in 2 weeks. -  MD visit Edema Control: Wound #17 Right,Proximal Achilles: Kerlix and Coban - Right Lower Extremity Elevate legs to the level of the heart and pump ankles as often as possible Wound #9 Right,Distal Achilles: NEASIA, FLEEMAN ([id number]) Kerlix and Coban - Right Lower Extremity Elevate legs to the level of the heart and pump ankles as often as possible Off-Loading: Wound #17 Right,Proximal Achilles: Other: - Float heels Wound #9 Right,Distal Achilles: Other: - Float heels Advanced Therapies: Wound #17 Right,Proximal Achilles: Apligraf application in clinic; including contact layer, fixation with steri strips, dry gauze and cover dressing. Wound #9 Right,Distal Achilles: Apligraf application in clinic; including contact layer, fixation with steri strips, dry gauze and cover dressing. Apligraf number 2 Electronic Signature(s) Signed: 05/27/2016 5:22:07 PM By: Baltazar Najjar MD Entered By: Baltazar Najjar on 05/27/2016 17:05:29 [name] ([id number]) -------------------------------------------------------------------------------- SuperBill Details Patient Name: [name] Date of Service: 05/27/2016 Medical Record Patient Account Number: [id number] Number: Treating RN: Huel Coventry 1933-07-24 (82 [name] Other Clinician: Date of Birth/Sex: Female) Treating Hyden Soley Primary Care Physician/Extender: Rex Kras, Heidi Physician: Weeks in Treatment: 23 Referring Physician: [name] Diagnosis Coding ICD-10 Codes Code Description E11.621 Type 2 diabetes mellitus with foot ulcer L97.512 Non-pressure chronic ulcer of other part of right foot with fat layer exposed E11.51 Type 2 diabetes mellitus with diabetic peripheral angiopathy without gangrene Facility Procedures CPT4 Code Description: [id number] (Facility Use Only) Apligraf 44 SQ CM Modifier: Quantity: 44 CPT4 Code Description: [id number] - SKIN SUB GRAFT TRNK/ARM/LEG ICD-10 Description Diagnosis E11.621 Type 2 diabetes mellitus with foot ulcer L97.512 Non-pressure chronic ulcer of other part of right foo Modifier: t with fat la Quantity: 1 yer exposed Physician Procedures CPT4 Code Description: [id number] - WC PHYS SKIN SUB GRAFT TRNK/ARM/LEG ICD-10 Description Diagnosis E11.621 Type 2 diabetes mellitus with foot ulcer L97.512 Non-pressure chronic ulcer of other part of right foot Modifier: with fat lay Quantity: 1 er exposed Electronic Signature(s) Signed: 05/27/2016 5:22:07 PM By: Baltazar Najjar MD Entered By: Baltazar Najjar on 05/27/2016 17:06:03

## 2016-06-02 NOTE — Progress Notes (Signed)
[name] ([id number]) Visit Report for 05/21/2016 Arrival Information Details Patient Name: [name], [name] 05/21/2016 8:45 Date of Service: AM Medical Record [id number] Number: Patient Account Number: [id number] September 10, 1933 (82 y.o. Treating RN: Phillis Haggis Date of Birth/Sex: Female) Other Clinician: Primary Care Physician: [name] Treating Referring Physician: [name] Physician/Extender: Weeks in Treatment: 22 Visit Information History Since Last Visit All ordered tests and consults were completed: No Patient Arrived: Wheel Chair Added or deleted any medications: No Arrival Time: 08:55 Any new allergies or adverse reactions: No Accompanied By: daughter Had a fall or experienced change in No Transfer Assistance: EasyPivot Patient activities of daily living that may affect Lift risk of falls: Patient Identification Verified: Yes Signs or symptoms of abuse/neglect since last No Secondary Verification Process Yes visito Completed: Hospitalized since last visit: No Patient Requires Transmission- No Has Dressing in Place as Prescribed: Yes Based Precautions: Pain Present Now: No Patient Has Alerts: Yes Patient Alerts: Patient on Blood Thinner DMII Warfarin ABI [employer] Bilateral NO BP RIGHT ARM Electronic Signature(s) Signed: 05/28/2016 2:13:37 PM By: Elliot Gurney, RN, BSN, Kim RN, BSN Entered By: Elliot Gurney, RN, BSN, Kim on 05/28/2016 14:06:48 [name] ([id number]) -------------------------------------------------------------------------------- Clinic Level of Care Assessment Details Patient Name: [name], MACLAREN 05/21/2016 8:45 Date of Service: AM Medical Record [id number] Number: Patient Account Number: [id number] [birth year] (82 y.o. Treating RN: Phillis Haggis Date of Birth/Sex: Female) Other Clinician: Primary Care Physician: [name] Treating Referring Physician: [name] Physician/Extender: Weeks in Treatment: 22 Clinic  Level of Care Assessment Items TOOL 4 Quantity Score X - Use when only an EandM is performed on FOLLOW-UP visit 1 0 ASSESSMENTS - Nursing Assessment / Reassessment X - Reassessment of Co-morbidities (includes updates in patient status) 1 10 X - Reassessment of Adherence to Treatment Plan 1 5 ASSESSMENTS - Wound and Skin Assessment / Reassessment []  - Simple Wound Assessment / Reassessment - one wound 0 X - Complex Wound Assessment / Reassessment - multiple wounds 4 5 []  - Dermatologic / Skin Assessment (not related to wound area) 0 ASSESSMENTS - Focused Assessment []  - Circumferential Edema Measurements - multi extremities 0 []  - Nutritional Assessment / Counseling / Intervention 0 []  - Lower Extremity Assessment (monofilament, tuning fork, pulses) 0 []  - Peripheral Arterial Disease Assessment (using hand held doppler) 0 ASSESSMENTS - Ostomy and/or Continence Assessment and Care []  - Incontinence Assessment and Management 0 []  - Ostomy Care Assessment and Management (repouching, etc.) 0 PROCESS - Coordination of Care []  - Simple Patient / Family Education for ongoing care 0 X - Complex (extensive) Patient / Family Education for ongoing care 1 20 X - Staff obtains Chiropractor, Records, Test Results / Process Orders 1 10 X - Staff telephones HHA, Nursing Homes / Clarify orders / etc 1 10 Lamons, HEDDA CRUMBLEY ([id number]) []  - Routine Transfer to another Facility (non-emergent condition) 0 []  - Routine Hospital Admission (non-emergent condition) 0 []  - New Admissions / Manufacturing engineer / Ordering NPWT, Apligraf, etc. 0 []  - Emergency Hospital Admission (emergent condition) 0 X - Simple Discharge Coordination 1 10 []  - Complex (extensive) Discharge Coordination 0 PROCESS - Special Needs []  - Pediatric / Minor Patient Management 0 []  - Isolation Patient Management 0 []  - Hearing / Language / Visual special needs 0 []  - Assessment of Community assistance (transportation, D/C planning,  etc.) 0 []  - Additional assistance / Altered mentation 0 []  - Support Surface(s) Assessment (bed, cushion, seat, etc.) 0 INTERVENTIONS - Wound Cleansing / Measurement []  -  Simple Wound Cleansing - one wound 0 X - Complex Wound Cleansing - multiple wounds 2 5 []  - Wound Imaging (photographs - any number of wounds) 0 []  - Wound Tracing (instead of photographs) 0 []  - Simple Wound Measurement - one wound 0 []  - Complex Wound Measurement - multiple wounds 0 INTERVENTIONS - Wound Dressings X - Small Wound Dressing one or multiple wounds 2 10 X - Medium Wound Dressing one or multiple wounds 1 15 []  - Large Wound Dressing one or multiple wounds 0 []  - Application of Medications - topical 0 []  - Application of Medications - injection 0 [name] ([id number]) INTERVENTIONS - Miscellaneous []  - External ear exam 0 []  - Specimen Collection (cultures, biopsies, blood, body fluids, etc.) 0 []  - Specimen(s) / Culture(s) sent or taken to Lab for analysis 0 X - Patient Transfer (multiple staff / Nurse, adult / Similar devices) 1 10 []  - Simple Staple / Suture removal (25 or less) 0 []  - Complex Staple / Suture removal (26 or more) 0 []  - Hypo / Hyperglycemic Management (close monitor of Blood Glucose) 0 []  - Ankle / Brachial Index (ABI) - do not check if billed separately 0 []  - Vital Signs 0 Has the patient been seen at the hospital within the last three years: Yes Total Score: 140 Level Of Care: New/Established - Level 4 Electronic Signature(s) Signed: 05/28/2016 2:13:37 PM By: Elliot Gurney, RN, BSN, Kim RN, BSN Entered By: Elliot Gurney, RN, BSN, Kim on 05/28/2016 14:07:29 [name] ([id number]) -------------------------------------------------------------------------------- Encounter Discharge Information Details Patient Name: [name], [name] 05/21/2016 8:45 Date of Service: AM Medical Record [id number] Number: Patient Account Number: [id number] 1933/10/28 (82 y.o. Treating RN: Phillis Haggis Date of Birth/Sex: Female) Other Clinician: Primary Care Physician: [name] Treating Referring Physician: [name] Physician/Extender: Weeks in Treatment: 22 Encounter Discharge Information Items Discharge Pain Level: 0 Discharge Condition: Stable Ambulatory Status: Wheelchair Nursing Discharge Destination: Home Transportation: Private Auto Accompanied By: daughter Schedule Follow-up Appointment: Yes Medication Reconciliation completed Yes and provided to Patient/Care Cyris Maalouf: Clinical Summary of Care: Electronic Signature(s) Signed: 05/28/2016 2:13:37 PM By: Elliot Gurney, RN, BSN, Kim RN, BSN Entered By: Elliot Gurney, RN, BSN, Kim on 05/28/2016 14:07:19 [name] ([id number]) -------------------------------------------------------------------------------- Patient/Caregiver Education Details Patient Name: TAMERIA, [name] 05/21/2016 8:45 Date of Service: AM Medical Record [id number] Number: Patient Account Number: [id number] 03/05/1934 (82 y.o. Treating RN: Phillis Haggis Date of Birth/Gender: Female) Other Clinician: Primary Care Physician: [name] Treating Referring Physician: [name] Physician/Extender: Tania Ade in Treatment: 22 Education Assessment Education Provided To: Patient Education Topics Provided Wound/Skin Impairment: Handouts: Other: change dressing as ordered Methods: Demonstration, Explain/Verbal Responses: State content correctly Electronic Signature(s) Signed: 05/28/2016 2:13:37 PM By: Elliot Gurney, RN, BSN, Kim RN, BSN Entered By: Elliot Gurney, RN, BSN, Kim on 05/28/2016 14:07:10 [name] ([id number]) -------------------------------------------------------------------------------- Wound Assessment Details Patient Name: JANAIYA, BEAUCHESNE 05/21/2016 8:45 Date of Service: AM Medical Record [id number] Number: Patient Account Number: [id number] [birth year] (82 y.o. Treating RN: Phillis Haggis Date of Birth/Sex: Female) Other  Clinician: Primary Care Physician: [name] Treating Coulter, Leah Referring Physician: [name] Physician/Extender: Weeks in Treatment: 22 Wound Status Wound Number: 14 Primary Open Surgical Wound Etiology: Wound Location: Right Amputation Site - Digit Wound Open Wounding Event: Surgical Injury Status: Date Acquired: 11/11/2015 Comorbid Arrhythmia, Congestive Heart Failure, Weeks Of Treatment: 20 History: Hypertension, Type II Diabetes Clustered Wound: No Wound Measurements Length: (cm) 0.3 Width: (cm) 0.2 Depth: (cm) 0.3 Area: (cm) 0.047 Volume: (cm) 0.014 % Reduction in  Area: 93.8% % Reduction in Volume: 81.3% Epithelialization: None Tunneling: No Undermining: No Wound Description Full Thickness Without Exposed Classification: Support Structures Wound Margin: Flat and Intact Exudate Large Amount: Exudate Type: Serous Exudate Color: amber Foul Odor After Cleansing: No Wound Bed Granulation Amount: Small (1-33%) Exposed Structure Granulation Quality: Pink Fascia Exposed: No Necrotic Amount: Large (67-100%) Fat Layer Exposed: No Necrotic Quality: Adherent Slough Tendon Exposed: No Muscle Exposed: No Joint Exposed: No Bone Exposed: No Limited to Skin Breakdown Periwound Skin Texture Texture Color No Abnormalities Noted: No No Abnormalities Noted: No KORAIMA, ALBERTSEN ([id number]) Callus: No Atrophie Blanche: No Crepitus: No Cyanosis: No Excoriation: No Ecchymosis: No Fluctuance: No Erythema: No Friable: No Hemosiderin Staining: No Induration: No Mottled: No Localized Edema: No Pallor: No Rash: No Rubor: No Scarring: No Temperature / Pain Moisture Temperature: No Abnormality No Abnormalities Noted: No Tenderness on Palpation: Yes Dry / Scaly: No Maceration: No Moist: Yes Wound Preparation Ulcer Cleansing: Rinsed/Irrigated with Saline Topical Anesthetic Applied: None Assessment Notes all measurements the same as last week  this a nurse visit for a skin graft. Electronic Signature(s) Signed: 06/02/2016 2:01:11 PM By: Alejandro Mulling Entered By: Alejandro Mulling on 05/21/2016 08:57:31 Shuntae, Herzig Vinetta Bergamo ([id number]) -------------------------------------------------------------------------------- Wound Assessment Details Patient Name: RIM, [name] 05/21/2016 8:45 Date of Service: AM Medical Record [id number] Number: Patient Account Number: [id number] 20-May-1934 (82 y.o. 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 Event: Gradually Appeared Etiology: Date Acquired: 06/03/2015 Wound Open Weeks Of Treatment: 4 Status: Clustered Wound: No Comorbid Arrhythmia, Congestive Heart History: Failure, Hypertension, Type II Diabetes Wound Measurements Length: (cm) 1.2 Width: (cm) 0.2 Depth: (cm) 0.4 Area: (cm) 0.188 Volume: (cm) 0.075 % Reduction in Area: 4.1% % Reduction in Volume: 23.5% Epithelialization: None Tunneling: No Undermining: No Wound Description Classification: Grade 1 Wound Margin: Distinct, outline attached Exudate Amount: Large Exudate Type: Serosanguineous Exudate Color: red, brown Foul Odor After Cleansing: No Wound Bed Granulation Amount: Large (67-100%) Exposed Structure Granulation Quality: Red Fascia Exposed: No Necrotic Amount: Small (1-33%) Fat Layer Exposed: No Necrotic Quality: Adherent Slough Tendon Exposed: No Muscle Exposed: No Joint Exposed: No Bone Exposed: No Limited to Skin Breakdown Periwound Skin Texture Texture Color TEREASA, YILMAZ ([id number]) No Abnormalities Noted: No No Abnormalities Noted:  No Moisture Temperature / Pain No Abnormalities Noted: No Temperature: No Abnormality Tenderness on Palpation: Yes Wound Preparation Ulcer Cleansing: Rinsed/Irrigated with Saline Topical Anesthetic Applied: None Assessment Notes all measurements the same as last week this a nurse visit for a skin graft. Electronic Signature(s) Signed: 06/02/2016 2:01:11 PM By: Alejandro Mulling Entered By: Alejandro Mulling on 05/21/2016 08:57:18 [name] ([id number]) -------------------------------------------------------------------------------- Wound Assessment Details Patient Name: LOUCINDA, CROY 05/21/2016 8:45 Date of Service: AM Medical Record [id number] Number: Patient Account Number: [id number] 12-25-1933 (82 y.o. Treating RN: Phillis Haggis Date of Birth/Sex: Female) Other Clinician: Primary Care Physician: [name] Treating Coulter, Leah Referring Physician: [name] Physician/Extender: Weeks in Treatment: 22 Wound Status Wound Number: 18 Primary To be determined Etiology: Wound Location: Right Abdomen - Lower Quadrant Wound Open Status: Wounding Event: Gradually Appeared Comorbid Arrhythmia, Congestive Heart Failure, Date Acquired: 05/13/2016 History: Hypertension, Type II Diabetes Weeks Of Treatment: 1 Clustered Wound: No Wound Measurements Length: (cm) 0.2 Width: (cm) 1.2 Depth: (  cm) 0.1 Area: (cm) 0.188 Volume: (cm) 0.019 % Reduction in Area: 0% % Reduction in Volume: 0% Epithelialization: None Tunneling: No Undermining: No Wound Description Classification: Partial Thickness Wound Margin: Distinct, outline attached Exudate Amount: Medium Exudate Type: Serous Exudate Color: amber Foul Odor After Cleansing: No Wound Bed Granulation Amount: Medium (34-66%) Exposed Structure Granulation Quality: Red Fascia Exposed: No Necrotic Amount: Medium (34-66%) Fat Layer Exposed: No Necrotic Quality: Adherent Slough Tendon Exposed: No Muscle  Exposed: No Joint Exposed: No Bone Exposed: No Limited to Skin Breakdown Periwound Skin Texture Texture Color No Abnormalities Noted: No No Abnormalities Noted: No Moisture Temperature / Pain Helane RimaHOPKINS, Zailee Y. ([id number]) No Abnormalities Noted: No Temperature: No Abnormality Moist: Yes Tenderness on Palpation: Yes Wound Preparation Ulcer Cleansing: Rinsed/Irrigated with Saline Topical Anesthetic Applied: None Assessment Notes all measurements the same as last week this a nurse visit for a skin graft. Electronic Signature(s) Signed: 06/02/2016 2:01:11 PM By: Alejandro MullingPinkerton, Debra Entered By: Alejandro MullingPinkerton, Debra on 05/21/2016 08:57:48 Helane RimaHOPKINS, Reylene Y. ([id number]) -------------------------------------------------------------------------------- Wound Assessment Details Patient Name: Helane RimaHOPKINS, Abbagale Y. 05/21/2016 8:45 Date of Service: AM Medical Record [id number] Number: Patient Account Number: [id number] 1933/11/28 (82 y.o. Treating RN: Phillis HaggisPinkerton, Debi Date of Birth/Sex: Female) Other Clinician: Primary Care Physician: Rolm GalaGrandis, Heidi Treating Coulter, Leah Referring Physician: Rolm GalaGrandis, Heidi Physician/Extender: Weeks in Treatment: 22 Wound Status Wound Number: 9 Primary Diabetic Wound/Ulcer of the Lower Etiology: Extremity Wound Location: Right Achilles - Distal Secondary Arterial Insufficiency Ulcer Wounding Event: Gradually Appeared Etiology: Date Acquired: 06/02/2015 Wound Open Weeks Of Treatment: 22 Status: Clustered Wound: No Comorbid Arrhythmia, Congestive Heart Pending Amputation On Presentation History: Failure, Hypertension, Type II Diabetes Wound Measurements Length: (cm) 5.1 Width: (cm) 2 Depth: (cm) 0.2 Area: (cm) 8.011 Volume: (cm) 1.602 % Reduction in Area: 75.4% % Reduction in Volume: 83.6% Epithelialization: None Tunneling: No Undermining: No Wound Description Classification: Grade 2 Wound Margin: Distinct, outline attached Exudate Amount:  Large Exudate Type: Serosanguineous Exudate Color: red, brown Foul Odor After Cleansing: No Wound Bed Granulation Amount: Medium (34-66%) Exposed Structure Granulation Quality: Pink Fascia Exposed: No Necrotic Amount: Medium (34-66%) Fat Layer Exposed: No Necrotic Quality: Adherent Slough Tendon Exposed: No Muscle Exposed: No Joint Exposed: No Bone Exposed: No Limited to Skin Breakdown Periwound Skin Texture Texture Color Helane RimaHOPKINS, Natina Y. ([id number]) No Abnormalities Noted: No No Abnormalities Noted: No Moisture Temperature / Pain No Abnormalities Noted: No Temperature: No Abnormality Dry / Scaly: Yes Tenderness on Palpation: Yes Moist: Yes Wound Preparation Ulcer Cleansing: Rinsed/Irrigated with Saline Topical Anesthetic Applied: None Assessment Notes all measurements the same as last week this a nurse visit for a skin graft. Treatment Notes Wound #9 (Right, Distal Achilles) 5. Secondary Dressing Applied ABD Pad Dry Gauze 7. Secured with Tape Notes kerlix, coban, xtrasorb, charcoal Electronic Signature(s) Signed: 06/02/2016 2:01:11 PM By: Alejandro MullingPinkerton, Debra Entered By: Alejandro MullingPinkerton, Debra on 05/21/2016 08:58:03

## 2016-06-03 ENCOUNTER — Other Ambulatory Visit: Payer: Self-pay | Admitting: Urology

## 2016-06-03 ENCOUNTER — Encounter (INDEPENDENT_AMBULATORY_CARE_PROVIDER_SITE_OTHER): Payer: Self-pay

## 2016-06-03 ENCOUNTER — Encounter: Payer: Medicare Other | Attending: Internal Medicine

## 2016-06-03 ENCOUNTER — Other Ambulatory Visit (INDEPENDENT_AMBULATORY_CARE_PROVIDER_SITE_OTHER): Payer: Self-pay | Admitting: Vascular Surgery

## 2016-06-03 DIAGNOSIS — E11621 Type 2 diabetes mellitus with foot ulcer: Secondary | ICD-10-CM | POA: Diagnosis present

## 2016-06-03 DIAGNOSIS — E1151 Type 2 diabetes mellitus with diabetic peripheral angiopathy without gangrene: Secondary | ICD-10-CM | POA: Insufficient documentation

## 2016-06-03 DIAGNOSIS — I132 Hypertensive heart and chronic kidney disease with heart failure and with stage 5 chronic kidney disease, or end stage renal disease: Secondary | ICD-10-CM | POA: Insufficient documentation

## 2016-06-03 DIAGNOSIS — Z992 Dependence on renal dialysis: Secondary | ICD-10-CM | POA: Diagnosis not present

## 2016-06-03 DIAGNOSIS — Z8673 Personal history of transient ischemic attack (TIA), and cerebral infarction without residual deficits: Secondary | ICD-10-CM | POA: Insufficient documentation

## 2016-06-03 DIAGNOSIS — Z794 Long term (current) use of insulin: Secondary | ICD-10-CM | POA: Diagnosis not present

## 2016-06-03 DIAGNOSIS — E1122 Type 2 diabetes mellitus with diabetic chronic kidney disease: Secondary | ICD-10-CM | POA: Diagnosis not present

## 2016-06-03 DIAGNOSIS — I5033 Acute on chronic diastolic (congestive) heart failure: Secondary | ICD-10-CM | POA: Insufficient documentation

## 2016-06-03 DIAGNOSIS — I482 Chronic atrial fibrillation: Secondary | ICD-10-CM | POA: Diagnosis not present

## 2016-06-03 DIAGNOSIS — L97512 Non-pressure chronic ulcer of other part of right foot with fat layer exposed: Secondary | ICD-10-CM | POA: Diagnosis not present

## 2016-06-03 DIAGNOSIS — N186 End stage renal disease: Secondary | ICD-10-CM | POA: Diagnosis not present

## 2016-06-04 NOTE — Progress Notes (Signed)
JULAINE, ZIMNY ([id number]) Visit Report for 06/03/2016 Arrival Information Details Patient Name: [name], [name]. Date of Service: 06/03/2016 8:00 AM Medical Record Patient Account Number: [id number] Number: Treating RN: Phillis Haggis [birth year] (82 [name] Other Clinician: Date of Birth/Sex: Female) Treating ROBSON, MICHAEL Primary Care Physician/Extender: Rex Kras, Heidi Physician: Referring Physician: Charolotte Capuchin in Treatment: 24 Visit Information History Since Last Visit All ordered tests and consults were completed: No Patient Arrived: Wheel Chair Added or deleted any medications: No Arrival Time: 08:12 Any new allergies or adverse reactions: No Accompanied By: daughter Had a fall or experienced change in No Transfer Assistance: EasyPivot Patient activities of daily living that may affect Lift risk of falls: Patient Identification Verified: Yes Signs or symptoms of abuse/neglect since last No Secondary Verification Process Yes visito Completed: Hospitalized since last visit: No Patient Requires Transmission- No Has Dressing in Place as Prescribed: Yes Based Precautions: Pain Present Now: No Patient Has Alerts: Yes Patient Alerts: Patient on Blood Thinner DMII Warfarin ABI Wells Bilateral NO BP RIGHT ARM Electronic Signature(s) Signed: 06/03/2016 5:44:06 PM By: Alejandro Mulling Entered By: Alejandro Mulling on 06/03/2016 08:36:25 [name] ([id number]) -------------------------------------------------------------------------------- Clinic Level of Care Assessment Details Patient Name: [name] Date of Service: 06/03/2016 8:00 AM Medical Record Patient Account Number: [id number] Number: Treating RN: Phillis Haggis [birth year] (82 [name] Other Clinician: Date of Birth/Sex: Female) Treating ROBSON, MICHAEL Primary Care Physician/Extender: Rex Kras, Heidi Physician: Referring Physician: Charolotte Capuchin in  Treatment: 24 Clinic Level of Care Assessment Items TOOL 4 Quantity Score X - Use when only an EandM is performed on FOLLOW-UP visit 1 0 ASSESSMENTS - Nursing Assessment / Reassessment X - Reassessment of Co-morbidities (includes updates in patient status) 1 10 X - Reassessment of Adherence to Treatment Plan 1 5 ASSESSMENTS - Wound and Skin Assessment / Reassessment []  - Simple Wound Assessment / Reassessment - one wound 0 X - Complex Wound Assessment / Reassessment - multiple wounds 2 5 []  - Dermatologic / Skin Assessment (not related to wound area) 0 ASSESSMENTS - Focused Assessment []  - Circumferential Edema Measurements - multi extremities 0 []  - Nutritional Assessment / Counseling / Intervention 0 []  - Lower Extremity Assessment (monofilament, tuning fork, pulses) 0 []  - Peripheral Arterial Disease Assessment (using hand held doppler) 0 ASSESSMENTS - Ostomy and/or Continence Assessment and Care []  - Incontinence Assessment and Management 0 []  - Ostomy Care Assessment and Management (repouching, etc.) 0 PROCESS - Coordination of Care X - Simple Patient / Family Education for ongoing care 1 15 []  - Complex (extensive) Patient / Family Education for ongoing care 0 []  - Staff obtains Consents, Records, Test Results / Process Orders 0 [name], [name] ([id number]) []  - Staff telephones HHA, Nursing Homes / Clarify orders / etc 0 []  - Routine Transfer to another Facility (non-emergent condition) 0 []  - Routine Hospital Admission (non-emergent condition) 0 []  - New Admissions / Manufacturing engineer / Ordering NPWT, Apligraf, etc. 0 []  - Emergency Hospital Admission (emergent condition) 0 X - Simple Discharge Coordination 1 10 []  - Complex (extensive) Discharge Coordination 0 PROCESS - Special Needs []  - Pediatric / Minor Patient Management 0 []  - Isolation Patient Management 0 []  - Hearing / Language / Visual special needs 0 []  - Assessment of Community assistance  (transportation, D/C planning, etc.) 0 []  - Additional assistance / Altered mentation 0 []  - Support Surface(s) Assessment (bed, cushion, seat, etc.) 0 INTERVENTIONS - Wound Cleansing / Measurement []  - Simple Wound  Cleansing - one wound 0 []  - Complex Wound Cleansing - multiple wounds 0 X - Wound Imaging (photographs - any number of wounds) 1 5 []  - Wound Tracing (instead of photographs) 0 []  - Simple Wound Measurement - one wound 0 []  - Complex Wound Measurement - multiple wounds 0 INTERVENTIONS - Wound Dressings []  - Small Wound Dressing one or multiple wounds 0 X - Medium Wound Dressing one or multiple wounds 1 15 []  - Large Wound Dressing one or multiple wounds 0 []  - Application of Medications - topical 0 []  - Application of Medications - injection 0 [name], Natalie Y. ([id number]) INTERVENTIONS - Miscellaneous []  - External ear exam 0 []  - Specimen Collection (cultures, biopsies, blood, body fluids, etc.) 0 []  - Specimen(s) / Culture(s) sent or taken to Lab for analysis 0 []  - Patient Transfer (multiple staff / Michiel SitesHoyer Lift / Similar devices) 0 []  - Simple Staple / Suture removal (25 or less) 0 []  - Complex Staple / Suture removal (26 or more) 0 []  - Hypo / Hyperglycemic Management (close monitor of Blood Glucose) 0 []  - Ankle / Brachial Index (ABI) - do not check if billed separately 0 []  - Vital Signs 0 Has the patient been seen at the hospital within the last three years: Yes Total Score: 70 Level Of Care: New/Established - Level 2 Electronic Signature(s) Signed: 06/03/2016 5:44:06 PM By: Alejandro MullingPinkerton, Debra Entered By: Alejandro MullingPinkerton, Debra on 06/03/2016 08:38:21 [name], Natalie Y. ([id number]) -------------------------------------------------------------------------------- Encounter Discharge Information Details Patient Name: [name], Natalie Y. Date of Service: 06/03/2016 8:00 AM Medical Record Patient Account Number: [id number] Number: Treating RN: Phillis Haggisinkerton,  Debi 1933-10-01 (82 [name] Other Clinician: Date of Birth/Sex: Female) Treating ROBSON, MICHAEL Primary Care Physician/Extender: Rex KrasG Grandis, Heidi Physician: Referring Physician: Charolotte CapuchinGrandis, Heidi Weeks in Treatment: 24 Encounter Discharge Information Items Discharge Pain Level: 0 Discharge Condition: Stable Ambulatory Status: Wheelchair Discharge Destination: Home Private Transportation: Auto Accompanied By: daughter Schedule Follow-up Appointment: Yes Medication Reconciliation completed and Yes provided to Patient/Care Marycatherine Maniscalco: Clinical Summary of Care: Electronic Signature(s) Signed: 06/03/2016 5:44:06 PM By: Alejandro MullingPinkerton, Debra Entered By: Alejandro MullingPinkerton, Debra on 06/03/2016 08:37:44 [name], Madesyn Y. ([id number]) -------------------------------------------------------------------------------- Patient/Caregiver Education Details Patient Name: [name], Natalie Y. Date of Service: 06/03/2016 8:00 AM Medical Record Patient Account Number: [id number] Number: Treating RN: Phillis Haggisinkerton, Debi 1933-10-01 (82 [name] Other Clinician: Date of Birth/Gender: Female) Treating ROBSON, MICHAEL Primary Care Physician/Extender: Rex KrasG Grandis, Heidi Physician: Tania AdeWeeks in Treatment: 24 Referring Physician: Rolm GalaGrandis, Heidi Education Assessment Education Provided To: Patient Education Topics Provided Wound/Skin Impairment: Handouts: Other: keep dressing clean and dry Methods: Demonstration, Explain/Verbal Responses: State content correctly Electronic Signature(s) Signed: 06/03/2016 5:44:06 PM By: Alejandro MullingPinkerton, Debra Entered By: Alejandro MullingPinkerton, Debra on 06/03/2016 08:37:41 [name], Natalie Y. ([id number]) -------------------------------------------------------------------------------- Wound Assessment Details Patient Name: [name], Braylee Y. Date of Service: 06/03/2016 8:00 AM Medical Record Patient Account Number: [id number] Number: Treating RN: Phillis Haggisinkerton, Debi 1933-10-01 (82 [name] 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 Event: Gradually Appeared Etiology: Date Acquired: 06/03/2015 Wound Open Weeks Of Treatment: 6 Status: Clustered Wound: No Comorbid Arrhythmia, Congestive Heart History: Failure, Hypertension, Type II Diabetes Photos Photo Uploaded By: Alejandro MullingPinkerton, Debra on 06/03/2016 08:45:35 Wound Measurements Length: (cm) 1 Width: (cm) 0.4 Depth: (cm) 0.4 Area: (cm) 0.314 Volume: (cm) 0.126 % Reduction  in Area: -60.2% % Reduction in Volume: -28.6% Epithelialization: None Tunneling: No Undermining: No Wound Description Classification: Grade 1 Wound Margin: Distinct, outline attached Exudate Amount: Large Exudate Type: Serosanguineous Exudate Color: red, brown ABRIAL, ARRIGHI ([id number]) Foul Odor After Cleansing: No Wound Bed Granulation Amount: Large (67-100%) Exposed Structure Granulation Quality: Red Fascia Exposed: No Necrotic Amount: Small (1-33%) Fat Layer Exposed: No Necrotic Quality: Adherent Slough Tendon Exposed: No Muscle Exposed: No Joint Exposed: No Bone Exposed: No Limited to Skin Breakdown Periwound Skin Texture Texture Color No Abnormalities Noted: No No Abnormalities Noted: No Moisture Temperature / Pain No Abnormalities Noted: No Temperature: No Abnormality Tenderness on Palpation: Yes Wound Preparation Ulcer Cleansing: Rinsed/Irrigated with Saline Topical Anesthetic Applied: None Assessment Notes All measurements are the same this visit is a nurse visit for a wound check for a skin graft. Treatment Notes Wound #17 (Right, Proximal Achilles) 1. Cleansed with: Cleanse wound with antibacterial soap  and water 5. Secondary Dressing Applied ABD Pad Dry Gauze 7. Secured with Tape Notes kerlix, coban, xtrasorb, charcoal Electronic Signature(s) Signed: 06/03/2016 5:44:06 PM By: Alejandro Mulling Entered By: Alejandro Mulling on 06/03/2016 08:16:29 [name] ([id number]) -------------------------------------------------------------------------------- Wound Assessment Details Patient Name: [name] Date of Service: 06/03/2016 8:00 AM Medical Record Patient Account Number: [id number] Number: Treating RN: Phillis Haggis [birth year] (82 [name] Other Clinician: Date of Birth/Sex: Female) Treating ROBSON, MICHAEL Primary Care Physician/Extender: Rex Kras, Heidi Physician: Referring Physician: Charolotte Capuchin in Treatment: 24 Wound Status Wound Number: 9 Primary Diabetic Wound/Ulcer of the Lower Etiology: Extremity Wound Location: Right Achilles - Distal Secondary Arterial Insufficiency Ulcer Wounding Event: Gradually Appeared Etiology: Date Acquired: 06/02/2015 Wound Open Weeks Of Treatment: 24 Status: Clustered Wound: No Comorbid Arrhythmia, Congestive Heart Pending Amputation On Presentation History: Failure, Hypertension, Type II Diabetes Photos Photo Uploaded By: Alejandro Mulling on 06/03/2016 08:45:36 Wound Measurements Length: (cm) 5 Width: (cm) 2 Depth: (cm) 0.1 Area: (cm) 7.854 Volume: (cm) 0.785 % Reduction in Area: 75.9% % Reduction in Volume: 92% Epithelialization: None Tunneling: No Wound Description Classification: Grade 2 Wound Margin: Distinct, outline attached Exudate Amount: Large Exudate Type: Serosanguineous Exudate Color: red, brown REILLEY, VALENTINE ([id number]) Foul Odor After Cleansing: No Wound Bed Granulation Amount: None Present (0%) Necrotic Amount: None Present (0%) Periwound Skin Texture Texture Color No Abnormalities Noted: No No Abnormalities Noted: No Moisture Temperature / Pain No Abnormalities  Noted: No Temperature: No Abnormality Tenderness on Palpation: Yes Wound Preparation Topical Anesthetic Applied: None Assessment Notes All measurements are the same this visit is a nurse visit for a wound check for a skin graft. Treatment Notes Wound #9 (Right, Distal Achilles) 1. Cleansed with: Cleanse wound with antibacterial soap and water 5. Secondary Dressing Applied ABD Pad Dry Gauze 7. Secured with Tape Notes kerlix, coban, xtrasorb, charcoal Electronic Signature(s) Signed: 06/03/2016 5:44:06 PM By: Alejandro Mulling Entered By: Alejandro Mulling on 06/03/2016 08:16:54

## 2016-06-08 ENCOUNTER — Encounter
Admission: RE | Disposition: A | Discharge status: Home / Self Care | Payer: Self-pay | Source: Ambulatory Visit | Attending: Vascular Surgery

## 2016-06-08 ENCOUNTER — Ambulatory Visit
Admission: RE | Admit: 2016-06-08 | Discharge: 2016-06-08 | Disposition: A | Discharge status: Home / Self Care | Payer: Medicare Other | Source: Ambulatory Visit | Attending: Vascular Surgery | Admitting: Vascular Surgery

## 2016-06-08 DIAGNOSIS — Z992 Dependence on renal dialysis: Secondary | ICD-10-CM | POA: Insufficient documentation

## 2016-06-08 DIAGNOSIS — E1140 Type 2 diabetes mellitus with diabetic neuropathy, unspecified: Secondary | ICD-10-CM | POA: Diagnosis not present

## 2016-06-08 DIAGNOSIS — Z452 Encounter for adjustment and management of vascular access device: Secondary | ICD-10-CM | POA: Insufficient documentation

## 2016-06-08 DIAGNOSIS — K219 Gastro-esophageal reflux disease without esophagitis: Secondary | ICD-10-CM | POA: Insufficient documentation

## 2016-06-08 DIAGNOSIS — Z9841 Cataract extraction status, right eye: Secondary | ICD-10-CM | POA: Insufficient documentation

## 2016-06-08 DIAGNOSIS — Z89421 Acquired absence of other right toe(s): Secondary | ICD-10-CM | POA: Diagnosis not present

## 2016-06-08 DIAGNOSIS — Z888 Allergy status to other drugs, medicaments and biological substances status: Secondary | ICD-10-CM | POA: Insufficient documentation

## 2016-06-08 DIAGNOSIS — I509 Heart failure, unspecified: Secondary | ICD-10-CM | POA: Diagnosis not present

## 2016-06-08 DIAGNOSIS — N186 End stage renal disease: Secondary | ICD-10-CM | POA: Insufficient documentation

## 2016-06-08 DIAGNOSIS — D649 Anemia, unspecified: Secondary | ICD-10-CM | POA: Diagnosis not present

## 2016-06-08 DIAGNOSIS — I132 Hypertensive heart and chronic kidney disease with heart failure and with stage 5 chronic kidney disease, or end stage renal disease: Secondary | ICD-10-CM | POA: Diagnosis not present

## 2016-06-08 DIAGNOSIS — Z9109 Other allergy status, other than to drugs and biological substances: Secondary | ICD-10-CM | POA: Insufficient documentation

## 2016-06-08 DIAGNOSIS — Z91041 Radiographic dye allergy status: Secondary | ICD-10-CM | POA: Diagnosis not present

## 2016-06-08 DIAGNOSIS — Z96643 Presence of artificial hip joint, bilateral: Secondary | ICD-10-CM | POA: Diagnosis not present

## 2016-06-08 DIAGNOSIS — Z7901 Long term (current) use of anticoagulants: Secondary | ICD-10-CM | POA: Diagnosis not present

## 2016-06-08 DIAGNOSIS — Z974 Presence of external hearing-aid: Secondary | ICD-10-CM | POA: Diagnosis not present

## 2016-06-08 DIAGNOSIS — G2581 Restless legs syndrome: Secondary | ICD-10-CM | POA: Insufficient documentation

## 2016-06-08 DIAGNOSIS — Z89422 Acquired absence of other left toe(s): Secondary | ICD-10-CM | POA: Diagnosis not present

## 2016-06-08 DIAGNOSIS — Z8673 Personal history of transient ischemic attack (TIA), and cerebral infarction without residual deficits: Secondary | ICD-10-CM | POA: Insufficient documentation

## 2016-06-08 DIAGNOSIS — Z885 Allergy status to narcotic agent status: Secondary | ICD-10-CM | POA: Insufficient documentation

## 2016-06-08 DIAGNOSIS — I4891 Unspecified atrial fibrillation: Secondary | ICD-10-CM | POA: Insufficient documentation

## 2016-06-08 DIAGNOSIS — Z9842 Cataract extraction status, left eye: Secondary | ICD-10-CM | POA: Insufficient documentation

## 2016-06-08 DIAGNOSIS — E1122 Type 2 diabetes mellitus with diabetic chronic kidney disease: Secondary | ICD-10-CM | POA: Diagnosis not present

## 2016-06-08 HISTORY — PX: PERIPHERAL VASCULAR CATHETERIZATION: SHX172C

## 2016-06-08 SURGERY — DIALYSIS/PERMA CATHETER REMOVAL
Anesthesia: Moderate Sedation

## 2016-06-08 MED ORDER — LIDOCAINE HCL (PF) 1 % IJ SOLN
INTRAMUSCULAR | Status: AC
  Filled 2016-06-08: qty 30

## 2016-06-08 MED ORDER — LIDOCAINE HCL (PF) 1 % IJ SOLN
INTRAMUSCULAR | Status: DC | PRN
  Administered 2016-06-08: 20 mL via INTRADERMAL

## 2016-06-08 SURGICAL SUPPLY — 4 items
FCP FG STRG 5.5XNS LF DISP (INSTRUMENTS) ×1
FORCEPS FG STRG 5.5XNS LF DISP (INSTRUMENTS) ×1 IMPLANT
FORCEPS KELLY 5.5 STR (INSTRUMENTS) ×2
TRAY LACERAT/PLASTIC (MISCELLANEOUS) ×3 IMPLANT

## 2016-06-08 NOTE — Op Note (Signed)
Operative Note     Preoperative diagnosis:   1. ESRD with functional permanent access  Postoperative diagnosis:  1. ESRD with functional permanent access  Procedure:  Removal of left femoral Permcath  Surgeon:  Festus BarrenJason Sequoia Mincey, MD  Anesthesia:  Local  EBL:  Minimal  Indication for the Procedure:  The patient has a functional permanent dialysis access and no longer needs their permcath.  This can be removed.  Risks and benefits are discussed and informed consent is obtained.  Description of the Procedure:  The patient's left groin, thigh and existing catheter were sterilely prepped and draped. The area around the catheter was anesthetized copiously with 1% lidocaine. The catheter was dissected out with curved hemostats until the cuff was freed from the surrounding fibrous sheath. The fiber sheath was transected, and the catheter was then removed in its entirety using gentle traction. Pressure was held and sterile dressings were placed. The patient tolerated the procedure well and was taken to the recovery room in stable condition.     Festus BarrenJason Bao Coreas  06/08/2016, 11:34 AM This note was created with Dragon Medical transcription system. Any errors in dictation are purely unintentional.

## 2016-06-08 NOTE — H&P (Signed)
[facility] History & Physical Update  The patient was interviewed and re-examined.  The patient's previous History and Physical has been reviewed and is unchanged.  There is no change in the plan of care. We plan to proceed with the scheduled procedure.  Festus BarrenJason Mikhayla Phillis, MD  06/08/2016, 10:59 AM

## 2016-06-09 ENCOUNTER — Encounter: Payer: Self-pay | Admitting: Vascular Surgery

## 2016-06-10 ENCOUNTER — Encounter: Payer: Medicare Other | Admitting: Internal Medicine

## 2016-06-10 DIAGNOSIS — E11621 Type 2 diabetes mellitus with foot ulcer: Secondary | ICD-10-CM | POA: Diagnosis not present

## 2016-06-12 NOTE — Progress Notes (Signed)
[name] ([id number]) Visit Report for 06/10/2016 Chief Complaint Document Details Patient Name: [name], [name]. Date of Service: 06/10/2016 8:45 AM Medical Record Patient Account Number: [id number] Number: Treating RN: Phillis Haggis 17-Aug-1933 (82 y.o. Other Clinician: Date of Birth/Sex: Female) Treating Goku Harb Primary Care Physician/Extender: Rex Kras, Heidi Physician: Referring Physician: Charolotte Capuchin in Treatment: 25 Information Obtained from: Patient Chief Complaint [name] presents today for follow-up evaluation of her diabetic foot ulcers and abdominal wound. Electronic Signature(s) Signed: 06/10/2016 6:05:53 PM By: Baltazar Najjar MD Entered By: Baltazar Najjar on 06/10/2016 12:33:43 [name] ([id number]) -------------------------------------------------------------------------------- Debridement Details Patient Name: [name] Date of Service: 06/10/2016 8:45 AM Medical Record Patient Account Number: [id number] Number: Treating RN: Phillis Haggis 1933/07/19 (82 y.o. Other Clinician: Date of Birth/Sex: Female) Treating Jairus Tonne Primary Care Physician/Extender: Rex Kras, Heidi Physician: Referring Physician: Charolotte Capuchin in Treatment: 25 Debridement Performed for Wound #9 Right,Distal Achilles Assessment: Performed By: Physician Maxwell Caul, MD Debridement: Debridement Pre-procedure Yes - 09:18 Verification/Time Out Taken: Start Time: 09:19 Pain Control: Lidocaine 4% Topical Solution Level: Skin/Subcutaneous Tissue Total Area Debrided (L x 5 (cm) x 2 (cm) = 10 (cm) W): Tissue and other Viable, Non-Viable, Exudate, Fibrin/Slough, Subcutaneous material debrided: Instrument: Curette Bleeding: Minimum Hemostasis Achieved: Pressure End Time: 09:23 Procedural Pain: 0 Post Procedural Pain: 0 Response to Treatment: Procedure was tolerated well Post Debridement  Measurements of Total Wound Length: (cm) 5 Width: (cm) 5 Depth: (cm) 0.2 Volume: (cm) 3.927 Character of Wound/Ulcer Post Requires Further Debridement Debridement: Severity of Tissue Post Debridement: Fat layer exposed Post Procedure Diagnosis Same as Pre-procedure Electronic Signature(s) Signed: 06/10/2016 6:05:53 PM By: Baltazar Najjar MD [name] ([id number]) Signed: 06/11/2016 5:33:09 PM By: Alejandro Mulling Entered By: Baltazar Najjar on 06/10/2016 12:33:33 [name] ([id number]) -------------------------------------------------------------------------------- HPI Details Patient Name: [name] Date of Service: 06/10/2016 8:45 AM Medical Record Patient Account Number: [id number] Number: Treating RN: Phillis Haggis July 22, 1933 (82 y.o. Other Clinician: Date of Birth/Sex: Female) Treating Lenni Reckner Primary Care Physician/Extender: Rex Kras, Heidi Physician: Referring Physician: Charolotte Capuchin in Treatment: 25 History of Present Illness Location: right posterior heel, right Achilles, right lower quadrant abdomen, right fourth toe amp site Quality: denies pain to any wound Severity: not applicable Timing: denies pain HPI Description: 83 year old patient who is known to be diabetic, was referred to [place] by Dr. Gavin Potters for a right heel ulceration which she's had for a while. She was recently in hospital for a pneumonia and at that time and got delirious and was disoriented and sometime during this time developed a stage II ulcer on her right heel. Her past medical history is significant for bilateral pneumonia which was treated with injectable antibiotics and then to oral Levaquin which he has completed. She also has acute on chronic diastolic CHF, acute on chronic respiratory failure, end-stage renal disease on hemodialysis, atrial fibrillation, recent stroke, diabetes mellitus. The patient and her son are poor historians but from  what I understand she was admitted to the hospital with an acute vascular compromise of her right lower extremity and Dr. Wyn Quaker has done a surgical procedure and we are trying to obtain these notes. There are also some vascular workup done and we will try and obtain these notes. the injury to the left lower quadrant of abdomen and the suprapubic area have been there due to a bruise and have been there for several months and no intervention has been done.  10/11/2015 -- on review of the [facility] records it was noted that the patient was admitted to the hospital on 09/14/2015 with peripheral vascular disease with claudication, end-stage renal disease, pressure ulcer, chronic atrial fibrillation. She was seen by Dr. Wyn Quaker who did her right lower extremity angiogram , angioplasty of the right anterior tibial artery and thrombolysis with TPA of the right popliteal artery, and thrombectomy. She was seen by Dr. Wyn Quaker during this past week and he was pleased with the progress. He did say that if he took her to the operating room for any procedure he would debride the abdominal wound under anesthesia. She was also seen by Dr. Ether Griffins the podiatrist who thought that she may lose her right fourth toe at some stage may need an amputation of this. 10/21/2015 --patient known to Dr. Wyn Quaker and his last office visit from 10/04/2015 has been reviewed. 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 She also had a persistent ulceration of the right heel and markedly this area and a right third and fourth toe and a small scab on the calf but these are dry and seemed to be improving. Patient also has a left carotid endarterectomy and multiple interventions to a right brachiocephalic AV fistula. After the visit he had recommended noninvasive studies to recheck her revascularization. [name], [name]  ([id number]) He was off the impression that she would likely lose the right fourth toe and the third toe was likely to heal. He was concerned about underlying muscle necrosis on her right heel and midfoot. 11/01/2015 -- an echo done in January of this year showed her left ventricular ejection fraction to be about 50-55%. The patient was seen by the PA and Dr. Driscilla Grammes office and the plan was to take her to the operating room soon to have a debridement under anesthesia for the abdominal wall wound, the Achilles tendon on the right leg and amputation of the right fourth toe. The daughter and the patient do not feel that they would be able to undergo hyperbaric oxygen therapy 5 days a week for 6 weeks. 12/17/15; this is a medically complex woman who I note was recently in this clinic however I was not involved with her care. She returns today with multiple wounds; a) she has a wound in the mid abdomen that is been there since March of this year. I note that she is been to the overall for debridement recently. The exact etiology of this wound is not really clear b) left lower quadrant abdominal wound had some sanguinous drainage when she came in here. The patient fell in January and thinks this may have been secondary to a hematoma. c): The patient has 3 wounds on her right leg including a small wound on the right mid calf, a large area over the Achilles which currently has a wound VAC for the last 6 weeks, also a smaller wound on the distal part of the right heel. As far as I understand most of these wounds are currently been dressed with's calcium alginate. According to her daughter the Achilles wound under the wound VAC is doing well d) the patient is had an amputation of her left fifth toe in January and the right fourth toe 6 weeks ago secondary to diabetic PAD e) the patient has chronic renal failure on dialysis for the last 2 years secondary to type 2 diabetes on insulin. The daughter's  knowledge there is been no biopsy of the abdominal wounds given their current appearance and lack of undefined etiology at have to wonder about calciphylaxis. 12/18/15:Addendum; I have reviewed [facility] I can see no relevant x-rays of the right heel. I note her arteriogram and revascularization of her right lower extremity in April 2017. She had debridement of both abdominal wounds and the right heel and Achilles wound on 11/07/15 at which time she had a right fourth toe ray amputation. The abdominal wounds were debridement again on 6/29. I do not see any relevant pathology of these abdominal wounds 12/24/15; culture I did of the drainage from the midline abdominal wound last week showed both Proteus and ampicillin sensitive enterococcus. I've given her a course of Augmentin adjusted on dialysis days. She has no specific complaints today. Been using Santyl to the abdominal wounds in the right leg wound and the wound VAC on the right Achilles which was initially prescribed by Dr. dew 12/31/15; I have done two punch biopsies of the large midline abdominal. 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 May have been a trauma component of the one on the left lower quadrant however the midline wound had no such history. She has a large area on the right Achilles heel with a wound VAC prescribed by Dr. dew. A small wound on the right anterior leg.Marland Kitchen UNFORTUNATELY she has 2 new wounds today. One on the left heel which is probably a pressure area. As well her previous amputation site of her right fourth toe has dehisced and now has a small wound with significant depth at the amputation site. 01/14/2016 -- she returns after 2 weeks and had had a punch biopsy of abdominal wound done the last visit -- had a biopsy of her midline abdominal wound done and the Pathology diagnosis is that of ulceration, necrosis and inflammation and negative for dysplasia and malignancy. 01/21/16. 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 In the meantime she has new wounds of the left heel the right fourth toe amputation site is opened up. The back is stopped to the right heel area. 01/28/16; the abdominal wounds continued to improve. The extensive wound on her right Achilles also looks stable except for the lower aspect of the wound where there is a large liquefied area that probes right down [name], [name] ([id number]) to her calcaneus. This cultured Proteus last week I have her on Augmentin and doxycycline 1. I think this is going to need a course of IV antibiotics and I will call dialysis. X-ray I did last week was negative, I think she is going to need an MRI 02/04/16; right heel MRI as before Saturday. Receiving I believe IV Rocephin at dialysis 02/11/16; as it turns out the patient could not have a MRI as she has a bladder stimulator in place even though it is not currently in use since the beginning of this year. Although she has an allergy to IV contrast she apparently has done well with premedication so we will have to go for a CT scan with contrast. In the meantime she has had a fall now has a large skin tear on her left upper arm. She went to the ER and they suggested Tegaderm over topical antibiotics 03/03/16 currently patient returns after having been hospitalized for 2 weeks and subsequently transferred to [facility] nursing facility. She actually seems to be doing excellent compared to even when we last saw her according to  our nursing staff. Both patient and her daughter are extremely encouraged at how well she is presenting at this point in time. Overall the biggest issue is still the right Achilles area which is being managed at this point in time by Dr. Wyn Quaker. Patient is currently utilizing a wound VAC to that region. 03/17/16; patient is at [facility] nursing home still. Using Aquacel Ag to the wounds on her bilateral feet and still Prisma to the  small open area on her abdomen. 03/31/16 at this point in time patient has been tolerating the dressing changes currently. She fortunately has no worsening of her symptoms although she tells me that the nurse who is caring for her at [facility] nursing facility decided that nothing was needed in regard to the lower abdominal wound from a dressing standpoint at this time. she is really not having significant discomfort or pain at this point she continues to have some tunneling in the proximal Achilles wound region. 04/07/16 patient continues to do well on evaluation today. Even the Achilles wound which has been the most tender is not giving her as much trouble. Unfortunately the PolyMem dressings that we utilize last week really did not seem to benefit her in particular. Obviously we will discontinue that at this point in time today. 04-15-16:Ms. Pester is accompanied today by her daughter. She is still residing in an SNF undergoing dialysis, continues to receive antibiotics during dialysis as prescribed by Dr. Sampson Goon of infectious disease. She has a follow-up appointment with Dr. Sampson Goon on 04-17-16 to discuss the continuation of these antibiotics. Ms. Cottier denies any pain to any of the 3 remaining wounds she does admit to changing to [employer] surgical shoes for safety while ambulating with physical therapy. She denies any falls since her last appointment here although she and her daughter do admit to increased tremors of unclear etiology since her last appointment. She has tolerated the dressing changes that were prescribed last week. 04-22-16 Ms. Lafave presents today accompanied by her daughter for evaluation of her diabetic foot ulcers. She is still residing in an SNF, she continues dialysis 3 times weekly. She'll follow up with Dr. Sampson Goon last Friday, and at that appointment IV antibiotics were discontinued. According to Ms. Odell and her daughter if there is any regression of  her wounds Dr. Sampson Goon will consider re-starting the antibiotics. Ms. Boven daughter states that since the discontinuation of antibiotic therapy her "twitches" have resolved. 04/29/16; I have not seen this patient in quite some time however she is completed triple antibiotic therapy given at dialysis for osteomyelitis as prescribed by infectious disease. She is still being followed by Dr. dew of vascular surgery. We are using Hydrofera Blue to the surface of these wounds. She currently has 2 open areas a substantial area over her Achilles area although this is a lot better than the last time I saw this. She also has a small wound superiorly from this wound which has a superior probing depth of 2 cm. 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 My biopsy did not prove this nevertheless I think this is the correct clinical diagnosis. We are [name], [name] ([id number]) now following her for an area on the right Achilles part of her heel. This is not any different from last week. She also has  a small tunneling area just above this. And unfortunately this week there is been a reopening of an area where her right fourth toe was previously amputated. She is completed antibiotics 05/13/16; she has a new reopening on the mid abdominal wound in the same site is previously. This is a small open area. Apligraf #1 today to the areas on the right heel o2 still an open area in the fourth toe amputation site 05/27/16; the areas on her abdomen are completely closed over. Small open area from last time is closed. Apligraf #2 today to the areas on the right heel. The 4th toe amputation site is also heel 06/10/16; we did not have an Apligraf to put on today. This appeared tunneling wound on  the right posterior calf appears to be closed. The more substantial area on her Achilles itself is improved. Electronic Signature(s) Signed: 06/10/2016 6:05:53 PM By: Baltazar Najjar MD Entered By: Baltazar Najjar on 06/10/2016 12:34:49 [name] ([id number]) -------------------------------------------------------------------------------- Physical Exam Details Patient Name: [name] Date of Service: 06/10/2016 8:45 AM Medical Record Patient Account Number: [id number] Number: Treating RN: Phillis Haggis September 28, 1933 (82 y.o. Other Clinician: Date of Birth/Sex: Female) Treating Verlee Pope Primary Care Physician/Extender: Rex Kras, Heidi Physician: Referring Physician: Charolotte Capuchin in Treatment: 25 Constitutional Sitting or standing Blood Pressure is within target range for patient.. Pulse regular and within target range for patient.Marland Kitchen Respirations regular, non-labored and within target range.. Temperature is normal and within the target range for the patient.. Patient's appearance is neat and clean. Appears in no acute distress. Well nourished and well developed.. Notes Wound exam; the area over the Achilles looks considerably improved however surface slough debrided with a #3 curet also nonviable subcutaneous tissue and of the slough. Post debridement this actually looks very healthy. This superior wound to this which was a tunneling area in the posterior calf appears to been fully epithelialized Electronic Signature(s) Signed: 06/10/2016 6:05:53 PM By: Baltazar Najjar MD Entered By: Baltazar Najjar on 06/10/2016 12:37:58 [name] ([id number]) -------------------------------------------------------------------------------- Physician Orders Details Patient Name: [name]. Date of Service: 06/10/2016 8:45 AM Medical Record Patient Account Number: [id number] Number: Treating RN: Phillis Haggis Feb 28, 1934 (82 y.o. Other  Clinician: Date of Birth/Sex: Female) Treating Ron Junco Primary Care Physician/Extender: Rex Kras, Heidi Physician: Referring Physician: Charolotte Capuchin in Treatment: 25 Verbal / Phone Orders: Yes Clinician: Ashok Cordia, Debi Read Back and Verified: Yes Diagnosis Coding Wound Cleansing o Clean wound with Normal Saline. o Cleanse wound with mild soap and water o May Shower, gently pat wound dry prior to applying new dressing. Anesthetic o Topical Lidocaine 4% cream applied to wound bed prior to debridement - all wounds for clinic use only Primary Wound Dressing Wound #9 Right,Distal Achilles o Hydrafera Blue - ready transfer Secondary Dressing Wound #9 Right,Distal Achilles o ABD pad o Conform/Kerlix o Other - coban (lightly) Dressing Change Frequency Wound #9 Right,Distal Achilles o Three times weekly - HHRN to change dressing on Mondays and Fridays and pt will come to wound care center on Wednesdays. Follow-up Appointments Wound #9 Right,Distal Achilles o Return Appointment in 1 week. Edema Control Wound #9 Right,Distal Achilles o Elevate legs to the level of the heart and pump ankles as often as possible Off-Loading Wound #9 Right,Distal Achilles KYIA, RHUDE ([id number]) o Other: - Float heels Electronic Signature(s) Signed: 06/10/2016 6:05:53 PM By: Baltazar Najjar MD Signed: 06/11/2016 5:33:09 PM By: Alejandro Mulling Entered By: Alejandro Mulling on 06/10/2016 09:26:51 Carrolyn Leigh  Jeannie Fend ([id number]) -------------------------------------------------------------------------------- Problem List Details Patient Name: [name], [name]. Date of Service: 06/10/2016 8:45 AM Medical Record Patient Account Number: [id number] Number: Treating RN: Phillis Haggis Feb 17, 1934 (82 y.o. Other Clinician: Date of Birth/Sex: Female) Treating Maryellen Dowdle Primary Care Physician/Extender: Rex Kras, Heidi Physician: Referring  Physician: Charolotte Capuchin in Treatment: 25 Active Problems ICD-10 Encounter Code Description Active Date Diagnosis E11.621 Type 2 diabetes mellitus with foot ulcer 12/17/2015 Yes L97.512 Non-pressure chronic ulcer of other part of right foot with 03/31/2016 Yes fat layer exposed E11.51 Type 2 diabetes mellitus with diabetic peripheral 12/17/2015 Yes angiopathy without gangrene Inactive Problems ICD-10 Code Description Active Date Inactive Date M86.671 Other chronic osteomyelitis, right ankle and foot 04/15/2016 04/15/2016 S31.104A Unspecified open wound of abdominal wall, left lower 12/17/2015 12/17/2015 quadrant without penetration into peritoneal cavity, initial encounter S31.103D Unspecified open wound of abdominal wall, right lower 04/15/2016 04/15/2016 quadrant without penetration into peritoneal cavity, subsequent encounter Resolved Problems [name], [name] ([id number]) Electronic Signature(s) Signed: 06/10/2016 6:05:53 PM By: Baltazar Najjar MD Entered By: Baltazar Najjar on 06/10/2016 12:33:05 [name] ([id number]) -------------------------------------------------------------------------------- Progress Note Details Patient Name: [name] Date of Service: 06/10/2016 8:45 AM Medical Record Patient Account Number: [id number] Number: Treating RN: Phillis Haggis [birth year] (82 y.o. Other Clinician: Date of Birth/Sex: Female) Treating Cindy Brindisi Primary Care Physician/Extender: Rex Kras, Heidi Physician: Referring Physician: Charolotte Capuchin in Treatment: 25 Subjective Chief Complaint Information obtained from Patient Ms. Bisono presents today for follow-up evaluation of her diabetic foot ulcers and abdominal wound. History of Present Illness (HPI) The following HPI elements were documented for the patient's wound: Location: right posterior heel, right Achilles, right lower quadrant abdomen, right fourth toe amp site Quality:  denies pain to any wound Severity: not applicable Timing: denies pain 83 year old patient who is known to be diabetic, was referred to [place] by Dr. Gavin Potters for a right heel ulceration which she's had for a while. She was recently in hospital for a pneumonia and at that time and got delirious and was disoriented and sometime during this time developed a stage II ulcer on her right heel. Her past medical history is significant for bilateral pneumonia which was treated with injectable antibiotics and then to oral Levaquin which he has completed. She also has acute on chronic diastolic CHF, acute on chronic respiratory failure, end-stage renal disease on hemodialysis, atrial fibrillation, recent stroke, diabetes mellitus. The patient and her son are poor historians but from what I understand she was admitted to the hospital with an acute vascular compromise of her right lower extremity and Dr. Wyn Quaker has done a surgical procedure and we are trying to obtain these notes. There are also some vascular workup done and we will try and obtain these notes. the injury to the left lower quadrant of abdomen and the suprapubic area have been there due to a bruise and have been there for several months and no intervention has been done. 10/11/2015 -- on review of the [facility] records it was noted that the patient was admitted to the hospital on 09/14/2015 with peripheral vascular disease with claudication, end-stage renal disease, pressure ulcer, chronic atrial fibrillation. She was seen by Dr. Wyn Quaker who did her right lower extremity angiogram , angioplasty of the right anterior tibial artery and thrombolysis with TPA of the right popliteal artery, and thrombectomy. She was seen by Dr. Wyn Quaker during this past week and he was pleased with the progress. He did say that if he took her to  the operating room for any procedure he would debride the abdominal wound under anesthesia. She was also seen by Dr. Ether Griffins the  podiatrist who thought that she may lose her right fourth toe at some stage may need an amputation of this. [name], [name] ([id number]) 10/21/2015 --patient known to Dr. Wyn Quaker and his last office visit from 10/04/2015 has been reviewed. 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 She also had a persistent ulceration of the right heel and markedly this area and a right third and fourth toe and a small scab on the calf but these are dry and seemed to be improving. Patient also has a left carotid endarterectomy and multiple interventions to a right brachiocephalic AV fistula. After the visit he had recommended noninvasive studies to recheck her revascularization. He was off the impression that she would likely lose the right fourth toe and the third toe was likely to heal. He was concerned about underlying muscle necrosis on her right heel and midfoot. 11/01/2015 -- an echo done in January of this year showed her left ventricular ejection fraction to be about 50-55%. The patient was seen by the PA and Dr. Driscilla Grammes office and the plan was to take her to the operating room soon to have a debridement under anesthesia for the abdominal wall wound, the Achilles tendon on the right leg and amputation of the right fourth toe. The daughter and the patient do not feel that they would be able to undergo hyperbaric oxygen therapy 5 days a week for 6 weeks. 12/17/15; this is a medically complex woman who I note was recently in this clinic however I was not involved with her care. She returns today with multiple wounds; a) she has a wound in the mid abdomen that is been there since March of this year. I note that she is been to the overall for debridement recently. The exact etiology of this wound is not really clear b) left lower quadrant abdominal wound had some sanguinous drainage when she  came in here. The patient fell in January and thinks this may have been secondary to a hematoma. c): The patient has 3 wounds on her right leg including a small wound on the right mid calf, a large area over the Achilles which currently has a wound VAC for the last 6 weeks, also a smaller wound on the distal part of the right heel. As far as I understand most of these wounds are currently been dressed with's calcium alginate. According to her daughter the Achilles wound under the wound VAC is doing well d) the patient is had an amputation of her left fifth toe in January and the right fourth toe 6 weeks ago secondary to diabetic PAD e) the patient has chronic renal failure on dialysis for the last 2 years secondary to type 2 diabetes on insulin. The daughter's knowledge there is been no biopsy of the abdominal wounds given their current appearance and lack of undefined etiology at have to wonder about calciphylaxis. 12/18/15:Addendum; I have reviewed [facility] I can see no relevant x-rays of the right heel. I note her arteriogram and revascularization of her right lower extremity in April 2017. She had debridement of both abdominal wounds and the right heel and Achilles wound on 11/07/15 at which time she had a right fourth toe ray amputation. The abdominal wounds were debridement again on 6/29. I do not  see any relevant pathology of these abdominal wounds 12/24/15; culture I did of the drainage from the midline abdominal wound last week showed both Proteus and ampicillin sensitive enterococcus. I've given her a course of Augmentin adjusted on dialysis days. She has no specific complaints today. Been using Santyl to the abdominal wounds in the right leg wound and the wound VAC on the right Achilles which was initially prescribed by Dr. dew 12/31/15; I have done two punch biopsies of the large midline abdominal. 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 May have been a trauma component of the one on the  left lower quadrant however the midline wound had no such history. She has a large area on the right Achilles heel with a wound VAC prescribed by Dr. dew. A small wound on the right anterior leg.Marland Kitchen UNFORTUNATELY she has 2 new wounds today. One on the left heel which is probably a pressure area. As well her previous amputation site of her right fourth toe has dehisced and now has a small wound with significant depth at the amputation site. [name], [name] ([id number]) 01/14/2016 -- she returns after 2 weeks and had had a punch biopsy of abdominal wound done the last visit -- had a biopsy of her midline abdominal wound done and the Pathology diagnosis is that of ulceration, necrosis and inflammation and negative for dysplasia and malignancy. 01/21/16. 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 In the meantime she has new wounds of the left heel the right fourth toe amputation site is opened up. The back is stopped to the right heel area. 01/28/16; the abdominal wounds continued to improve. The extensive wound on her right Achilles also looks stable except for the lower aspect of the wound where there is a large liquefied area that probes right down to her calcaneus. This cultured Proteus last week I have her on Augmentin and doxycycline 1. I think this is going to need a course of IV antibiotics and I will call dialysis. X-ray I did last week was negative, I think she is going to need an MRI 02/04/16; right heel MRI as before Saturday. Receiving I believe IV Rocephin at dialysis 02/11/16; as it turns out the patient could not have a MRI as she has a bladder stimulator in place even though it is not currently in use since the beginning of this year. Although she has an allergy to IV contrast she apparently has done well with premedication so we will have to go for a CT scan with contrast. In the meantime she has had a fall now has  a large skin tear on her left upper arm. She went to the ER and they suggested Tegaderm over topical antibiotics 03/03/16 currently patient returns after having been hospitalized for 2 weeks and subsequently transferred to [facility] nursing facility. She actually seems to be doing excellent compared to even when we last saw her according to our nursing staff. Both patient and her daughter are extremely encouraged at how well she is presenting at this point in time. Overall the biggest issue is still the right Achilles area which is being managed at this point in time by Dr. Wyn Quaker. Patient is currently utilizing a wound VAC to that region. 03/17/16; patient is at [facility] nursing home still. Using Aquacel Ag to the wounds on her bilateral feet and still Prisma to the small open area on her abdomen. 03/31/16 at this point in time patient has been  tolerating the dressing changes currently. She fortunately has no worsening of her symptoms although she tells me that the nurse [facility] decided that nothing was needed in regard to the lower abdominal wound from a dressing standpoint at this time. she is really not having significant discomfort or pain at this point she continues to have some tunneling in the proximal Achilles wound region. 04/07/16 patient continues to do well on evaluation today. Even the Achilles wound which has been the most tender is not giving her as much trouble. Unfortunately the PolyMem dressings that we utilize last week really did not seem to benefit her in particular. Obviously we will discontinue that at this point in time today. 04-15-16:Ms. Skoczylas is accompanied today by her daughter. She is still residing in an SNF undergoing dialysis, continues to receive antibiotics during dialysis as prescribed by Dr. Sampson GoonFitzgerald of infectious disease. She has a follow-up appointment with Dr. Sampson GoonFitzgerald on 04-17-16 to discuss the continuation of  these antibiotics. Ms. Abbe AmsterdamHopkins denies any pain to any of the 3 remaining wounds she does admit to changing to [employer] surgical shoes for safety while ambulating with physical therapy. She denies any falls since her last appointment here although she and her daughter do admit to increased tremors of unclear etiology since her last appointment. She has tolerated the dressing changes that were prescribed last week. 04-22-16 Ms. Clover presents today accompanied by her daughter for evaluation of her diabetic foot ulcers. She is still residing in an SNF, she continues dialysis 3 times weekly. She'll follow up with Dr. Sampson GoonFitzgerald last Friday, and at that appointment IV antibiotics were discontinued. According to Ms. Daughtry and her daughter if there is any regression of her wounds Dr. Sampson GoonFitzgerald will consider re-starting the antibiotics. Ms. Abbe AmsterdamHopkins daughter states that since the discontinuation of antibiotic therapy her "twitches" have resolved. 04/29/16; I have not seen this patient in quite some time however she is completed triple antibiotic therapy Natalie RimaHOPKINS, Savahanna Y. ([id number]) given at dialysis for osteomyelitis as prescribed by infectious disease. She is still being followed by Dr. dew of vascular surgery. We are using Hydrofera Blue to the surface of these wounds. She currently has 2 open areas a substantial area over her Achilles area although this is a lot better than the last time I saw this. She also has a small wound superiorly from this wound which has a superior probing depth of 2 cm. 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 My biopsy did not prove this nevertheless I think this is the correct clinical diagnosis. We are now following her for an area on the right  Achilles part of her heel. This is not any different from last week. She also has a small tunneling area just above this. And unfortunately this week there is been a reopening of an area where her right fourth toe was previously amputated. She is completed antibiotics 05/13/16; she has a new reopening on the mid abdominal wound in the same site is previously. This is a small open area. Apligraf #1 today to the areas on the right heel o2 still an open area in the fourth toe amputation site 05/27/16; the areas on her abdomen are completely closed over. Small open area from last time is  closed. Apligraf #2 today to the areas on the right heel. The 4th toe amputation site is also heel 06/10/16; we did not have an Apligraf to put on today. This appeared tunneling wound on the right posterior calf appears to be closed. The more substantial area on her Achilles itself is improved. Objective Constitutional Sitting or standing Blood Pressure is within target range for patient.. Pulse regular and within target range for patient.Marland Kitchen Respirations regular, non-labored and within target range.. Temperature is normal and within the target range for the patient.. Patient's appearance is neat and clean. Appears in no acute distress. Well nourished and well developed.. Vitals Time Taken: 8:54 AM, Height: 65 in, Weight: 160 lbs, BMI: 26.6, Temperature: 97.3 F, Pulse: 83 bpm, Respiratory Rate: 16 breaths/min, Blood Pressure: 103/48 mmHg. General Notes: Wound exam; the area over the Achilles looks considerably improved however surface slough debrided with a #3 curet also nonviable subcutaneous tissue and of the slough. Post debridement this actually looks very healthy. This superior wound to this which was a tunneling area in the posterior calf appears to been fully epithelialized Integumentary (Hair, Skin) Wound #17 status is Open. Original cause of wound was Gradually Appeared. The wound is located on  the Right,Proximal Achilles. The wound measures 0cm length x 0cm width x 0cm depth; 0cm^2 area and 0cm^3 volume. The wound is limited to skin breakdown. There is no tunneling or undermining noted. There is a none present amount of drainage noted. The wound margin is distinct with the outline attached to the wound base. There is no granulation within the wound bed. There is no necrotic tissue within the wound bed. Periwound temperature was noted as No Abnormality. [name], [name] ([id number]) Wound #9 status is Open. Original cause of wound was Gradually Appeared. The wound is located on the Right,Distal Achilles. The wound measures 5cm length x 2cm width x 0.2cm depth; 7.854cm^2 area and 1.571cm^3 volume. There is no tunneling or undermining noted. There is a large amount of serosanguineous drainage noted. The wound margin is distinct with the outline attached to the wound base. There is large (67-100%) red, pink granulation within the wound bed. There is a small (1-33%) amount of necrotic tissue within the wound bed including Adherent Slough. The periwound skin appearance exhibited: Moist. Periwound temperature was noted as No Abnormality. The periwound has tenderness on palpation. Assessment Active Problems ICD-10 E11.621 - Type 2 diabetes mellitus with foot ulcer L97.512 - Non-pressure chronic ulcer of other part of right foot with fat layer exposed E11.51 - Type 2 diabetes mellitus with diabetic peripheral angiopathy without gangrene Procedures Wound #9 Wound #9 is a Diabetic Wound/Ulcer of the Lower Extremity located on the Right,Distal Achilles . There was a Skin/Subcutaneous Tissue Debridement ([id number]) debridement with total area of 10 sq cm performed by Maxwell Caul, MD. with the following instrument(s): Curette to remove Viable and Non-Viable tissue/material including Exudate, Fibrin/Slough, and Subcutaneous after achieving pain control using Lidocaine 4% Topical  Solution. A time out was conducted at 09:18, prior to the start of the procedure. A Minimum amount of bleeding was controlled with Pressure. The procedure was tolerated well with a pain level of 0 throughout and a pain level of 0 following the procedure. Post Debridement Measurements: 5cm length x 5cm width x 0.2cm depth; 3.927cm^3 volume. Character of Wound/Ulcer Post Debridement requires further debridement. Severity of Tissue Post Debridement is: Fat layer exposed. Post procedure Diagnosis Wound #9: Same as Pre-Procedure Plan Wound Cleansing: Clean wound with Normal Saline.  [name], [name] ([id number]) Cleanse wound with mild soap and water May Shower, gently pat wound dry prior to applying new dressing. Anesthetic: Topical Lidocaine 4% cream applied to wound bed prior to debridement - all wounds for clinic use only Primary Wound Dressing: Wound #9 Right,Distal Achilles: Hydrafera Blue - ready transfer Secondary Dressing: Wound #9 Right,Distal Achilles: ABD pad Conform/Kerlix Other - coban (lightly) Dressing Change Frequency: Wound #9 Right,Distal Achilles: Three times weekly - HHRN to change dressing on Mondays and Fridays and pt will come to wound care center on Wednesdays. Follow-up Appointments: Wound #9 Right,Distal Achilles: Return Appointment in 1 week. Edema Control: Wound #9 Right,Distal Achilles: Elevate legs to the level of the heart and pump ankles as often as possible Off-Loading: Wound #9 Right,Distal Achilles: Other: - Float heels hydroferal blue to the remaining wound area still a depper divot and the inferior part of this wound, consider apligraf next week because of this Electronic Signature(s) Signed: 06/10/2016 6:05:53 PM By: Baltazar Najjar MD Entered By: Baltazar Najjar on 06/10/2016 12:40:17 [name] ([id number]) -------------------------------------------------------------------------------- SuperBill Details Patient Name: [name] Date of Service: 06/10/2016 Medical Record Patient Account Number: [id number] Number: Treating RN: Phillis Haggis April 09, 1934 (82 y.o. Other Clinician: Date of Birth/Sex: Female) Treating Damisha Wolff Primary Care Physician/Extender: Rex Kras, Heidi Physician: Weeks in Treatment: 25 Referring Physician: [name] Diagnosis Coding ICD-10 Codes Code Description E11.621 Type 2 diabetes mellitus with foot ulcer L97.512 Non-pressure chronic ulcer of other part of right foot with fat layer exposed E11.51 Type 2 diabetes mellitus with diabetic peripheral angiopathy without gangrene Facility Procedures CPT4 Code Description: [id number] 11042 - DEB SUBQ TISSUE 20 SQ CM/< ICD-10 Description Diagnosis E11.621 Type 2 diabetes mellitus with foot ulcer L97.512 Non-pressure chronic ulcer of other part of right fo Modifier: ot with fat la Quantity: 1 yer exposed Physician Procedures CPT4 Code Description: [id number] 11042 - WC PHYS SUBQ TISS 20 SQ CM ICD-10 Description Diagnosis E11.621 Type 2 diabetes mellitus with foot ulcer L97.512 Non-pressure chronic ulcer of other part of right fo Modifier: ot with fat lay Quantity: 1 er exposed Electronic Signature(s) Signed: 06/10/2016 6:05:53 PM By: Baltazar Najjar MD Entered By: Baltazar Najjar on 06/10/2016 12:40:42

## 2016-06-12 NOTE — Progress Notes (Signed)
[name], Natalie Y. ([id number]) Visit Report for 06/10/2016 Arrival Information Details Patient Name: [name], Natalie Y. Date of Service: 06/10/2016 8:45 AM Medical Record Patient Account Number: [id number] Number: Treating RN: Phillis Haggisinkerton, Debi [birth year] (82 [name] Other Clinician: Date of Birth/Sex: Female) Treating ROBSON, MICHAEL Primary Care Physician: Rolm GalaGrandis, Heidi Physician/Extender: G Referring Physician: Charolotte CapuchinGrandis, Heidi Weeks in Treatment: 25 Visit Information History Since Last Visit All ordered tests and consults were completed: No Patient Arrived: Wheel Chair Added or deleted any medications: No Arrival Time: 08:54 Any new allergies or adverse reactions: No Accompanied By: daughter Had a fall or experienced change in No Transfer Assistance: EasyPivot Patient activities of daily living that may affect Lift risk of falls: Patient Identification Verified: Yes Signs or symptoms of abuse/neglect since last No Secondary Verification Process Yes visito Completed: Hospitalized since last visit: No Patient Requires Transmission- No Has Dressing in Place as Prescribed: Yes Based Precautions: Pain Present Now: No Patient Has Alerts: Yes Patient Alerts: Patient on Blood Thinner DMII Warfarin ABI Harrison Bilateral NO BP RIGHT ARM Electronic Signature(s) Signed: 06/11/2016 5:33:09 PM By: Alejandro MullingPinkerton, Debra Entered By: Alejandro MullingPinkerton, Debra on 06/10/2016 08:54:33 [name], Natalie Y. ([id number]) -------------------------------------------------------------------------------- Encounter Discharge Information Details Patient Name: [name], Natalie Y. Date of Service: 06/10/2016 8:45 AM Medical Record Patient Account Number: [id number] Number: Treating RN: Phillis Haggisinkerton, Debi [birth year] (82 [name] Other Clinician: Date of Birth/Sex: Female) Treating ROBSON, MICHAEL Primary Care Physician: Rolm GalaGrandis, Heidi Physician/Extender: G Referring Physician: Charolotte CapuchinGrandis, Heidi Weeks in  Treatment: 25 Encounter Discharge Information Items Discharge Pain Level: 0 Discharge Condition: Stable Ambulatory Status: Wheelchair Discharge Destination: Home Transportation: Private Auto Accompanied By: daughter Schedule Follow-up Appointment: Yes Medication Reconciliation completed and provided to Patient/Care Yes [name]: Provided on Clinical Summary of Care: 06/10/2016 Form Type [facility]ip[facility] Paper [facility] Signed: 06/10/2016 9:37:28 AM By: Gwenlyn PerkingMoore, Shelia Entered By: Gwenlyn PerkingMoore, Shelia on 06/10/2016 09:37:28 [name], Natalie Y. ([id number]) -------------------------------------------------------------------------------- Lower Extremity Assessment Details Patient Name: [name], Natalie Y. Date of Service: 06/10/2016 8:45 AM Medical Record Patient Account Number: [id number] Number: Treating RN: Phillis Haggisinkerton, Debi [birth year] (82 [name] Other Clinician: Date of Birth/Sex: Female) Treating ROBSON, MICHAEL Primary Care Physician: Rolm GalaGrandis, Heidi Physician/Extender: G Referring Physician: Charolotte CapuchinGrandis, Heidi Weeks in Treatment: 25 Vascular Assessment Pulses: Dorsalis Pedis Palpable: [Right:Yes] Posterior Tibial Extremity colors, hair growth, and conditions: Extremity Color: [Right:Hyperpigmented] Temperature of Extremity: [Right:Warm] Capillary Refill: [Right:< 3 seconds] Toe Nail Assessment Left: Right: Thick: No Discolored: No Deformed: No Improper Length and Hygiene: No Electronic Signature(s) Signed: 06/11/2016 5:33:09 PM By: Alejandro MullingPinkerton, Debra Entered By: Alejandro MullingPinkerton, Debra on 06/10/2016 09:22:49 [name], Natalie Y. ([id number]) -------------------------------------------------------------------------------- Multi Wound Chart Details Patient Name: [name], Natalie Y. Date of Service: 06/10/2016 8:45 AM Medical Record Patient Account Number: [id number] Number: Treating RN: Phillis Haggisinkerton, Debi [birth year] (82 [name] 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 N/A Comorbid History: Arrhythmia, Congestive Arrhythmia, Congestive N/A Heart Failure, Heart Failure, Hypertension, Type II Hypertension, Type II Diabetes Diabetes Date Acquired: 06/03/2015 06/02/2015 N/A Weeks of Treatment: 7 25 N/A Wound Status: Open Open N/A  Pending Amputation on No Yes N/A Presentation: Measurements L x W x D 0x0x0 5x2x0.2 N/A (cm) Area (cm) : 0 7.854 N/A Volume (cm) : 0 1.571 N/A % Reduction in Area: 100.00% 75.90% N/A % Reduction in Volume: 100.00% 83.90% N/A Classification: Grade 1 Grade 2 N/A DONNESHA, KARG ([id number]) Exudate Amount: None Present Large N/A Exudate Type: N/A Serosanguineous N/A Exudate Color: N/A red, brown N/A Wound Margin: Distinct, outline attached Distinct, outline attached N/A Granulation Amount: None Present (0%) Large (67-100%) N/A Granulation Quality: N/A Red, Pink N/A Necrotic Amount: None Present (0%) Small (1-33%) N/A Exposed Structures: Fascia: No N/A N/A Fat: No Tendon: No Muscle: No Joint: No Bone: No Limited to Skin Breakdown Epithelialization: Large (67-100%) None N/A Debridement: N/A Debridement ([id number]) Pre-procedure N/A 09:18 N/A Verification/Time Out Taken: Pain Control: N/A Lidocaine 4% Topical N/A Solution Tissue  Debrided: N/A Fibrin/Slough, Exudates, N/A Subcutaneous Level: N/A Skin/Subcutaneous N/A Tissue Debridement Area (sq N/A 10 N/A cm): Instrument: N/A Curette N/A Bleeding: N/A Minimum N/A Hemostasis Achieved: N/A Pressure N/A Procedural Pain: N/A 0 N/A Post Procedural Pain: N/A 0 N/A Debridement Treatment N/A Procedure was tolerated N/A Response: well Post Debridement N/A 5x5x0.2 N/A Measurements L x W x D (cm) Post Debridement N/A 3.927 N/A Volume: (cm) Periwound Skin Texture: No Abnormalities Noted No Abnormalities Noted N/A Periwound Skin No Abnormalities Noted Moist: Yes N/A Moisture: Periwound Skin Color: No Abnormalities Noted No Abnormalities Noted N/A Temperature: No Abnormality No Abnormality N/A Tenderness on No Yes N/A Palpation: Wound Preparation: N/A SHERREL, PLOCH ([id number]) Ulcer Cleansing: Ulcer Cleansing: Rinsed/Irrigated with Rinsed/Irrigated with Saline, Other: soap and Saline, Other: soap and water water Topical Anesthetic Topical Anesthetic Applied: None Applied: Other: lidocaine 4% Procedures Performed: N/A Debridement N/A Treatment Notes Wound #9 (Right, Distal Achilles) 1. Cleansed with: Clean wound with Normal Saline Cleanse wound with antibacterial soap and water 2. Anesthetic Topical Lidocaine 4% cream to wound bed prior to debridement 3. Peri-wound Care: Barrier cream 4. Dressing Applied: Hydrafera Blue 5. Secondary Dressing Applied ABD Pad Kerlix/Conform Notes kerlix, coban Electronic Signature(s) Signed: 06/10/2016 6:05:53 PM By: Baltazar Najjar MD Entered By: Baltazar Najjar on 06/10/2016 12:33:20 [name] ([id number]) -------------------------------------------------------------------------------- Multi-Disciplinary Care Plan Details Patient Name: ALMAS, RAKE. Date of Service: 06/10/2016 8:45 AM Medical Record Patient Account Number: [id number] Number: Treating RN: Phillis Haggis [birth year] (82  [name] Other Clinician: Date of Birth/Sex: Female) Treating ROBSON, MICHAEL Primary Care Physician: [name] Physician/Extender: G Referring Physician: Charolotte Capuchin in Treatment: 25 Active Inactive Abuse / Safety / Falls / Self Care Management Nursing Diagnoses: Impaired physical mobility Potential for falls Goals: Patient will remain injury free Date Initiated: 12/17/2015 Goal Status: Active Interventions: Assess fall risk on admission and as needed Notes: Necrotic Tissue Nursing Diagnoses: Impaired tissue integrity related to necrotic/devitalized tissue Knowledge deficit related to management of necrotic/devitalized tissue Goals: Necrotic/devitalized tissue will be minimized in the wound bed Date Initiated: 02/04/2016 Goal Status: Active Interventions: Assess patient pain level pre-, during and post procedure and prior to discharge Provide education on necrotic tissue and debridement process Treatment Activities: Apply topical anesthetic as ordered : 02/04/2016 Notes: REMEE, CHARLEY ([id number]) Wound/Skin Impairment Nursing Diagnoses: Impaired tissue integrity Goals: Patient/caregiver will verbalize understanding of skin care regimen Date Initiated: 12/17/2015 Goal Status: Active Ulcer/skin breakdown will have a volume reduction of 30% by week 4 Date Initiated: 12/17/2015 Goal Status: Active Ulcer/skin breakdown will have a volume reduction of 50% by week 8 Date Initiated: 12/17/2015 Goal Status:  Active Ulcer/skin breakdown will have a volume reduction of 80% by week 12 Date Initiated: 12/17/2015 Goal Status: Active Ulcer/skin breakdown will heal within 14 weeks Date Initiated: 12/17/2015 Goal Status: Active Interventions: Assess patient/caregiver ability to obtain necessary supplies Assess patient/caregiver ability to perform ulcer/skin care regimen upon admission and as needed Assess ulceration(s) every visit Notes: Electronic Signature(s) Signed:  06/11/2016 5:33:09 PM By: Alejandro Mulling Entered By: Alejandro Mulling on 06/10/2016 09:23:14 [name] ([id number]) -------------------------------------------------------------------------------- Pain Assessment Details Patient Name: [name] Date of Service: 06/10/2016 8:45 AM Medical Record Patient Account Number: [id number] Number: Treating RN: Phillis Haggis [birth year] (82 [name] Other Clinician: Date of Birth/Sex: Female) Treating ROBSON, MICHAEL Primary Care Physician: [name] Physician/Extender: G Referring Physician: Charolotte Capuchin in Treatment: 25 Active Problems Location of Pain Severity and Description of Pain Patient Has Paino No Site Locations With Dressing Change: No Pain Management and Medication Current Pain Management: Electronic Signature(s) Signed: 06/11/2016 5:33:09 PM By: Alejandro Mulling Entered By: Alejandro Mulling on 06/10/2016 08:54:39 [name] ([id number]) -------------------------------------------------------------------------------- Patient/Caregiver Education Details Patient Name: [name]. Date of Service: 06/10/2016 8:45 AM Medical Record Patient Account Number: [id number] Number: Treating RN: Phillis Haggis 1933/11/18 (82 [name] Other Clinician: Date of Birth/Gender: Female) Treating ROBSON, MICHAEL Primary Care Physician: [name] Physician/Extender: G Referring Physician: Charolotte Capuchin in Treatment: 25 Education Assessment Education Provided To: Patient Education Topics Provided Wound/Skin Impairment: Handouts: Other: change dressing as ordered Methods: Demonstration, Explain/Verbal Responses: State content correctly Electronic Signature(s) Signed: 06/11/2016 5:33:09 PM By: Alejandro Mulling Entered By: Alejandro Mulling on 06/10/2016 09:10:23 [name] ([id number]) -------------------------------------------------------------------------------- Wound  Assessment Details Patient Name: [name] Date of Service: 06/10/2016 8:45 AM Medical Record Patient Account Number: [id number] Number: Treating RN: Phillis Haggis [birth year] (82 [name] Other Clinician: Date of Birth/Sex: Female) Treating ROBSON, MICHAEL Primary Care Physician: [name] Physician/Extender: G Referring Physician: Charolotte Capuchin in Treatment: 25 Wound Status Wound Number: 17 Primary Diabetic Wound/Ulcer of the Lower Etiology: Extremity Wound Location: Right Achilles - Proximal Secondary Arterial Insufficiency Ulcer Wounding Event: Gradually Appeared Etiology: Date Acquired: 06/03/2015 Wound Open Weeks Of Treatment: 7 Status: Clustered Wound: No Comorbid Arrhythmia, Congestive Heart History: Failure, Hypertension, Type II Diabetes Photos Photo Uploaded By: Alejandro Mulling on 06/10/2016 10:17:43 Wound Measurements Length: (cm) 0 % Reductio Width: (cm) 0 % Reductio Depth: (cm) 0 Epithelial Area: (cm) 0 Tunneling Volume: (cm) 0 Undermini n in Area: 100% n in Volume: 100% ization: Large (67-100%) : No ng: No Wound Description Classification: Grade 1 Wound Margin: Distinct, outline attached Exudate Amount: None Present Foul Odor After Cleansing: No Wound Bed Granulation Amount: None Present (0%) Exposed Structure Necrotic Amount: None Present (0%) Fascia Exposed: No CHAMYA, HUNTON ([id number]) Fat Layer Exposed: No Tendon Exposed: No Muscle Exposed: No Joint Exposed: No Bone Exposed: No Limited to Skin Breakdown Periwound Skin Texture Texture Color No Abnormalities Noted: No No Abnormalities Noted: No Moisture Temperature / Pain No Abnormalities Noted: No Temperature: No Abnormality Wound Preparation Ulcer Cleansing: Rinsed/Irrigated with Saline, Other: soap and water, Topical Anesthetic Applied: None Electronic Signature(s) Signed: 06/11/2016 5:33:09 PM By: Alejandro Mulling Entered By: Alejandro Mulling on  06/10/2016 09:22:14 [name] ([id number]) -------------------------------------------------------------------------------- Wound Assessment Details Patient Name: [name] Date of Service: 06/10/2016 8:45 AM Medical Record Patient Account Number: [id number] Number: Treating RN: Phillis Haggis [birth year] (82 [name] Other Clinician: Date of Birth/Sex: Female) Treating ROBSON, MICHAEL Primary Care Physician: [name] Physician/Extender: G Referring Physician:  Grandis, Heidi Weeks in Treatment: 25 Wound Status Wound Number: 9 Primary Diabetic Wound/Ulcer of the Lower Etiology: Extremity Wound Location: Right Achilles - Distal Secondary Arterial Insufficiency Ulcer Wounding Event: Gradually Appeared Etiology: Date Acquired: 06/02/2015 Wound Open Weeks Of Treatment: 25 Status: Clustered Wound: No Comorbid Arrhythmia, Congestive Heart Pending Amputation On Presentation History: Failure, Hypertension, Type II Diabetes Photos Photo Uploaded By: Alejandro Mulling on 06/10/2016 10:17:43 Wound Measurements Length: (cm) 5 Width: (cm) 2 Depth: (cm) 0.2 Area: (cm) 7.854 Volume: (cm) 1.571 % Reduction in Area: 75.9% % Reduction in Volume: 83.9% Epithelialization: None Tunneling: No Undermining: No Wound Description Classification: Grade 2 Wound Margin: Distinct, outline attached Exudate Amount: Large Exudate Type: Serosanguineous Exudate Color: red, brown Foul Odor After Cleansing: No Wound Bed ARTI, TRANG ([id number]) Granulation Amount: Large (67-100%) Granulation Quality: Red, Pink Necrotic Amount: Small (1-33%) Necrotic Quality: Adherent Slough Periwound Skin Texture Texture Color No Abnormalities Noted: No No Abnormalities Noted: No Moisture Temperature / Pain No Abnormalities Noted: No Temperature: No Abnormality Moist: Yes Tenderness on Palpation: Yes Wound Preparation Ulcer Cleansing: Rinsed/Irrigated with Saline, Other:  soap and water, Topical Anesthetic Applied: Other: lidocaine 4%, Treatment Notes Wound #9 (Right, Distal Achilles) 1. Cleansed with: Clean wound with Normal Saline Cleanse wound with antibacterial soap and water 2. Anesthetic Topical Lidocaine 4% cream to wound bed prior to debridement 3. Peri-wound Care: Barrier cream 4. Dressing Applied: Hydrafera Blue 5. Secondary Dressing Applied ABD Pad Kerlix/Conform Notes kerlix, coban Electronic Signature(s) Signed: 06/11/2016 5:33:09 PM By: Alejandro Mulling Entered By: Alejandro Mulling on 06/10/2016 09:06:39 [name] ([id number]) -------------------------------------------------------------------------------- Vitals Details Patient Name: [name] Date of Service: 06/10/2016 8:45 AM Medical Record Patient Account Number: [id number] Number: Treating RN: Phillis Haggis 1934/05/25 (82 [name] Other Clinician: Date of Birth/Sex: Female) Treating ROBSON, MICHAEL Primary Care Physician: [name] Physician/Extender: G Referring Physician: Charolotte Capuchin in Treatment: 25 Vital Signs Time Taken: 08:54 Temperature (F): 97.3 Height (in): 65 Pulse (bpm): 83 Weight (lbs): 160 Respiratory Rate (breaths/min): 16 Body Mass Index (BMI): 26.6 Blood Pressure (mmHg): 103/48 Reference Range: 80 - 120 mg / dl Electronic Signature(s) Signed: 06/11/2016 5:33:09 PM By: Alejandro Mulling Entered By: Alejandro Mulling on 06/10/2016 08:56:27

## 2016-06-16 ENCOUNTER — Encounter (INDEPENDENT_AMBULATORY_CARE_PROVIDER_SITE_OTHER): Payer: Medicare Other

## 2016-06-16 ENCOUNTER — Ambulatory Visit (INDEPENDENT_AMBULATORY_CARE_PROVIDER_SITE_OTHER): Payer: Medicare Other | Admitting: Vascular Surgery

## 2016-06-17 ENCOUNTER — Ambulatory Visit: Payer: Medicare Other | Admitting: Internal Medicine

## 2016-06-24 ENCOUNTER — Encounter: Payer: Medicare Other | Admitting: Internal Medicine

## 2016-06-24 DIAGNOSIS — E11621 Type 2 diabetes mellitus with foot ulcer: Secondary | ICD-10-CM | POA: Diagnosis not present

## 2016-06-26 NOTE — Progress Notes (Signed)
VONCILLE, SIMM ([id number]) Visit Report for 06/24/2016 Arrival Information Details Patient Name: [name], [name]. Date of Service: 06/24/2016 8:45 AM Medical Record Patient Account Number: [id number] Number: Treating RN: Phillis Haggis 12-20-1933 (82 y.o. Other Clinician: Date of Birth/Sex: Female) Treating ROBSON, MICHAEL Primary Care Markanthony Gedney: [name] Roselynn Whitacre/Extender: G Referring Zionah Criswell: Charolotte Capuchin in Treatment: 27 Visit Information History Since Last Visit All ordered tests and consults were completed: No Patient Arrived: Wheel Chair Added or deleted any medications: No Arrival Time: 09:12 Any new allergies or adverse reactions: No Accompanied By: daughter Had a fall or experienced change in No Transfer Assistance: EasyPivot Patient activities of daily living that may affect Lift risk of falls: Patient Identification Verified: Yes Signs or symptoms of abuse/neglect since last No Secondary Verification Process Yes visito Completed: Hospitalized since last visit: No Patient Requires Transmission- No Has Dressing in Place as Prescribed: Yes Based Precautions: Pain Present Now: No Patient Has Alerts: Yes Patient Alerts: Patient on Blood Thinner DMII Warfarin ABI [place] Bilateral NO BP RIGHT ARM Electronic Signature(s) Signed: 06/25/2016 3:37:28 PM By: Alejandro Mulling Entered By: Alejandro Mulling on 06/24/2016 09:18:57 [name] ([id number]) -------------------------------------------------------------------------------- Encounter Discharge Information Details Patient Name: [name]. Date of Service: 06/24/2016 8:45 AM Medical Record Patient Account Number: [id number] Number: Treating RN: Phillis Haggis Aug 23, 1933 (82 y.o. Other Clinician: Date of Birth/Sex: Female) Treating ROBSON, MICHAEL Primary Care Yasamin Karel: [name] Jamair Cato/Extender: G Referring Zeferino Mounts: Charolotte Capuchin in Treatment:  91 Encounter Discharge Information Items Discharge Pain Level: 0 Discharge Condition: Stable Ambulatory Status: Wheelchair Discharge Destination: Home Private Transportation: Auto Accompanied By: daughter Schedule Follow-up Appointment: Yes Medication Reconciliation completed and Yes provided to Patient/Care Eirik Schueler: Clinical Summary of Care: Electronic Signature(s) Signed: 06/25/2016 3:37:28 PM By: Alejandro Mulling Entered By: Alejandro Mulling on 06/24/2016 12:36:21 [name] ([id number]) -------------------------------------------------------------------------------- Lower Extremity Assessment Details Patient Name: [name] Date of Service: 06/24/2016 8:45 AM Medical Record Patient Account Number: [id number] Number: Treating RN: Phillis Haggis [birth year] (82 y.o. Other Clinician: Date of Birth/Sex: Female) Treating ROBSON, MICHAEL Primary Care Vincent Streater: [name] Iridessa Harrow/Extender: G Referring Dejana Pugsley: Charolotte Capuchin in Treatment: 27 Vascular Assessment Pulses: Dorsalis Pedis Palpable: [Right:Yes] Posterior Tibial Extremity colors, hair growth, and conditions: Extremity Color: [Right:Hyperpigmented] Temperature of Extremity: [Right:Warm] Capillary Refill: [Right:< 3 seconds] Toe Nail Assessment Left: Right: Thick: No Discolored: No Deformed: No Improper Length and Hygiene: No Electronic Signature(s) Signed: 06/25/2016 3:37:28 PM By: Alejandro Mulling Entered By: Alejandro Mulling on 06/24/2016 09:36:08 [name] ([id number]) -------------------------------------------------------------------------------- Multi Wound Chart Details Patient Name: [name] Date of Service: 06/24/2016 8:45 AM Medical Record Patient Account Number: [id number] Number: Treating RN: Phillis Haggis 09/05/1933 (82 y.o. 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/A] Comorbid History: [9:Arrhythmia, Congestive N/A Heart Failure, Hypertension, Type II Diabetes] Date Acquired: [9:06/02/2015] [N/A:N/A] Weeks of Treatment: [9:27] [N/A:N/A] Wound Status: [9:Open] [N/A:N/A] Pending Amputation on Yes [N/A:N/A] Presentation: Measurements L x W x D 4.5x1.5x0.2 [N/A:N/A] (cm) Area (cm) : [9:5.301] [N/A:N/A] Volume (cm) : [9:1.06] [N/A:N/A] % Reduction in Area: [9:83.70%] [N/A:N/A] % Reduction in Volume: 89.20% [  N/A:N/A] Classification: [9:Grade 2] [N/A:N/A] Exudate Amount: [9:Large] [N/A:N/A] Exudate Type: [9:Serosanguineous] [N/A:N/A] Exudate Color: [9:red, brown] [N/A:N/A] Wound Margin: [9:Distinct, outline attached N/A] Granulation Amount: [9:Large (67-100%)] [N/A:N/A] Granulation Quality: [9:Red, Pink] [N/A:N/A] Necrotic Amount: Small (1-33%) N/A N/A Epithelialization: None N/A N/A Periwound Skin Texture: No Abnormalities Noted N/A N/A Periwound Skin No Abnormalities Noted N/A N/A Moisture: Periwound Skin Color: No Abnormalities Noted N/A N/A Temperature: No Abnormality N/A N/A Tenderness on Yes N/A N/A Palpation: Wound Preparation: Ulcer Cleansing: N/A N/A Rinsed/Irrigated with Saline, Other: soap and water Topical Anesthetic Applied: Other: lidocaine 4% Procedures Performed: Cellular or Tissue Based N/A N/A Product Treatment Notes Electronic Signature(s) Signed: 06/24/2016 6:11:14 PM By: Baltazar Najjar MD Entered By: Baltazar Najjar on 06/24/2016 12:35:54 [name] ([id number]) -------------------------------------------------------------------------------- Multi-Disciplinary Care Plan Details Patient Name: KAMANI, MAGNUSSEN. Date of Service: 06/24/2016 8:45 AM Medical Record Patient Account Number: [id number] Number: Treating RN: Phillis Haggis March 04, 1934 (82 y.o. Other Clinician: Date of Birth/Sex: Female) Treating ROBSON, MICHAEL Primary Care Dian Minahan: [name] Joesphine Schemm/Extender: G Referring Adlai Sinning: Charolotte Capuchin in Treatment: 60 Active Inactive ` Abuse / Safety / Falls / Self Care Management Nursing Diagnoses: Impaired physical mobility Potential for falls Goals: Patient will remain injury free Date Initiated: 12/17/2015 Target Resolution Date: 08/29/2016 Goal Status: Active Interventions: Assess fall risk on admission and as needed Notes: ` Necrotic Tissue Nursing Diagnoses: Impaired tissue integrity related to necrotic/devitalized tissue Knowledge deficit related to management of necrotic/devitalized tissue Goals: Necrotic/devitalized tissue will be minimized in the wound bed Date Initiated: 02/04/2016 Target Resolution Date: 08/29/2016 Goal Status: Active Interventions: Assess patient pain level pre-, during and post procedure and prior to discharge Provide education on necrotic tissue and debridement process Treatment Activities: Apply topical anesthetic as ordered : 02/04/2016 BRIGHTON, DELIO ([id number]) Notes: ` Wound/Skin Impairment Nursing Diagnoses: Impaired tissue integrity Goals: Patient/caregiver will verbalize understanding of skin care regimen Date Initiated: 12/17/2015 Target Resolution Date: 08/29/2016 Goal Status: Active Ulcer/skin breakdown will have a volume reduction of 30% by week 4 Date Initiated: 12/17/2015 Target Resolution Date: 08/29/2016 Goal Status: Active Ulcer/skin breakdown will have a volume reduction of 50% by week  8 Date Initiated: 12/17/2015 Target Resolution Date: 08/29/2016 Goal Status: Active Ulcer/skin breakdown will have a volume reduction of 80% by week 12 Date Initiated: 12/17/2015 Target Resolution Date: 08/29/2016 Goal Status: Active Ulcer/skin breakdown will heal within 14 weeks Date Initiated: 12/17/2015 Target Resolution Date: 08/29/2016 Goal Status: Active Interventions: Assess patient/caregiver ability to obtain necessary supplies Assess patient/caregiver ability to perform ulcer/skin care regimen upon admission and as needed Assess ulceration(s) every visit Notes: Electronic Signature(s) Signed: 06/25/2016 3:37:28 PM By: Alejandro Mulling Entered By: Alejandro Mulling on 06/24/2016 09:41:43 [name] ([id number]) -------------------------------------------------------------------------------- Pain Assessment Details Patient Name: [name] Date of Service: 06/24/2016 8:45 AM Medical Record Patient Account Number: [id number] Number: Treating RN: Phillis Haggis 07/31/1933 (82 y.o. Other Clinician: Date of Birth/Sex: Female) Treating ROBSON, MICHAEL Primary Care Malyia Moro: [name] Ashlynne Shetterly/Extender: G Referring [place]: Charolotte Capuchin in Treatment: 27 Active Problems Location of Pain Severity and Description of Pain Patient Has Paino No Site Locations With Dressing Change: No Pain Management and Medication Current Pain Management: Electronic Signature(s) Signed: 06/25/2016 3:37:28 PM By: Alejandro Mulling Entered By: Alejandro Mulling on 06/24/2016 09:19:05 [name] ([id number]) -------------------------------------------------------------------------------- Patient/Caregiver Education Details Patient Name: [name]. Date of Service: 06/24/2016 8:45 AM Medical Record Patient Account Number: [id number] Number: Treating RN: Phillis Haggis [birth year] (82 y.o. Other Clinician: Date of  Birth/Gender: Female)  Treating ROBSON, MICHAEL Primary Care Physician: Rolm GalaGrandis, Heidi Physician/Extender: G Referring Physician: Charolotte CapuchinGrandis, Heidi Weeks in Treatment: 3727 Education Assessment Education Provided To: Patient Education Topics Provided Wound/Skin Impairment: Handouts: Other: change dressing as ordered Methods: Demonstration, Explain/Verbal Responses: State content correctly Electronic Signature(s) Signed: 06/25/2016 3:37:28 PM By: Alejandro MullingPinkerton, Debra Entered By: Alejandro MullingPinkerton, Debra on 06/24/2016 09:40:19 Helane RimaHOPKINS, Renae Y. ([id number]) -------------------------------------------------------------------------------- Wound Assessment Details Patient Name: Helane RimaHOPKINS, Cindia Y. Date of Service: 06/24/2016 8:45 AM Medical Record Patient Account Number: [id number] Number: Treating RN: Phillis Haggisinkerton, Debi 1933-08-26 (82 y.o. Other Clinician: Date of Birth/Sex: Female) Treating ROBSON, MICHAEL Primary Care Ranelle Auker: Rolm GalaGrandis, Heidi Alaysha Jefcoat/Extender: G Referring Saraya Tirey: Charolotte CapuchinGrandis, Heidi Weeks in Treatment: 27 Wound Status Wound Number: 9 Primary Diabetic Wound/Ulcer of the Lower Etiology: Extremity Wound Location: Right Achilles Secondary Arterial Insufficiency Ulcer Wounding Event: Gradually Appeared Etiology: Date Acquired: 06/02/2015 Wound Open Weeks Of Treatment: 27 Status: Clustered Wound: No Comorbid Arrhythmia, Congestive Heart Pending Amputation On Presentation History: Failure, Hypertension, Type II Diabetes Photos Photo Uploaded By: Alejandro MullingPinkerton, Debra on 06/24/2016 13:15:49 Wound Measurements Length: (cm) 4.5 Width: (cm) 1.5 Depth: (cm) 0.2 Area: (cm) 5.301 Volume: (cm) 1.06 % Reduction in Area: 83.7% % Reduction in Volume: 89.2% Epithelialization: None Tunneling: No Undermining: No Wound Description Classification: Grade 2 Wound Margin: Distinct, outline attached Exudate Amount: Large Exudate Type: Serosanguineous Exudate Color: red, brown Foul Odor After Cleansing:  No Wound Bed Granulation Amount: Large (67-100%) Granulation Quality: Red, Isla Penceink Saling, Kanetra Y. ([id number]) Necrotic Amount: Small (1-33%) Necrotic Quality: Adherent Slough Periwound Skin Texture Texture Color No Abnormalities Noted: No No Abnormalities Noted: No Moisture Temperature / Pain No Abnormalities Noted: No Temperature: No Abnormality Tenderness on Palpation: Yes Wound Preparation Ulcer Cleansing: Rinsed/Irrigated with Saline, Other: soap and water, Topical Anesthetic Applied: Other: lidocaine 4%, Treatment Notes Wound #9 (Right Achilles) 1. Cleansed with: Clean wound with Normal Saline 2. Anesthetic Topical Lidocaine 4% cream to wound bed prior to debridement 3. Peri-wound Care: Skin Prep 4. Dressing Applied: Other dressing (specify in notes) 5. Secondary Dressing Applied ABD Pad Notes kerlix, coban, xtrasorb, charcoal, Education administratorapligraph Electronic Signature(s) Signed: 06/25/2016 3:37:28 PM By: Alejandro MullingPinkerton, Debra Entered By: Alejandro MullingPinkerton, Debra on 06/24/2016 09:35:55 Helane RimaHOPKINS, Lavaughn Y. ([id number]) -------------------------------------------------------------------------------- Vitals Details Patient Name: Helane RimaHOPKINS, Jaelyne Y. Date of Service: 06/24/2016 8:45 AM Medical Record Patient Account Number: [id number] Number: Treating RN: Phillis Haggisinkerton, Debi 1933-08-26 (82 y.o. Other Clinician: Date of Birth/Sex: Female) Treating ROBSON, MICHAEL Primary Care Shandon Matson: Rolm GalaGrandis, Heidi Ladarion Munyon/Extender: G Referring Violia Knopf: Charolotte CapuchinGrandis, Heidi Weeks in Treatment: 27 Vital Signs Time Taken: 09:19 Pulse (bpm): 77 Height (in): 65 Respiratory Rate (breaths/min): 16 Weight (lbs): 160 Blood Pressure (mmHg): 123/46 Body Mass Index (BMI): 26.6 Reference Range: 80 - 120 mg / dl Electronic Signature(s) Signed: 06/25/2016 3:37:28 PM By: Alejandro MullingPinkerton, Debra Entered By: Alejandro MullingPinkerton, Debra on 06/24/2016 09:20:16

## 2016-07-01 ENCOUNTER — Ambulatory Visit: Payer: Medicare Other | Admitting: Internal Medicine

## 2016-07-01 NOTE — Progress Notes (Signed)
[name], [name] ([id number]) Visit Report for 06/24/2016 Chief Complaint Document Details Patient Name: [name], [name]. Date of Service: 06/24/2016 8:45 AM Medical Record Patient Account Number: [id number] Number: Treating RN: Phillis Haggis [birth year] (82 y.o. Other Clinician: Date of Birth/Sex: Female) Treating Bruce Churilla Primary Care Provider: [name] Provider/Extender: G Referring Provider: Charolotte Capuchin in Treatment: 27 Information Obtained from: Patient Chief Complaint Ms. Rieman presents today for follow-up evaluation of her diabetic foot ulcers and abdominal wound. Electronic Signature(s) Signed: 06/24/2016 6:11:14 PM By: Baltazar Najjar MD Entered By: Baltazar Najjar on 06/24/2016 12:36:37 [name] ([id number]) -------------------------------------------------------------------------------- Cellular or Tissue Based Product Details Patient Name: BLIMIE, VANESS. Date of Service: 06/24/2016 8:45 AM Medical Record Patient Account Number: [id number] Number: Treating RN: Phillis Haggis [birth year] (82 y.o. Other Clinician: Date of Birth/Sex: Female) Treating Kaidin Boehle Primary Care Provider: [name] Provider/Extender: G Referring Provider: Charolotte Capuchin in Treatment: 27 Cellular or Tissue Based Wound #9 Right Achilles Product Type Applied to: Performed By: Physician Maxwell Caul, MD Cellular or Tissue Based Apligraf Product Type: Pre-procedure Yes - 09:46 Verification/Time Out Taken: Location: trunk / arms / legs Wound Size (sq cm): 6.75 Product Size (sq cm): 44 Waste Size (sq cm): 37.25 Waste Reason: wound size Amount of Product Applied (sq cm): 6.75 Lot #: [id number] Expiration Date: 06/24/2016 Fenestrated: Yes Instrument: Blade Reconstituted: Yes Solution Type: NORMAL SALINE Solution Amount: 3 ML Lot #: [id number] Solution Expiration 01/30/2018 Date: Secured: Yes Secured With:  Steri-Strips Dressing Applied: Yes Primary Dressing: MEPITEL Procedural Pain: 0 Post Procedural Pain: 0 Response to Treatment: Procedure was tolerated well Post Procedure Diagnosis Same as Pre-procedure Electronic Signature(s) CAYDEE, TALKINGTON ([id number]) Signed: 06/24/2016 6:11:14 PM By: Baltazar Najjar MD Entered By: Baltazar Najjar on 06/24/2016 12:36:20 [name] ([id number]) -------------------------------------------------------------------------------- HPI Details Patient Name: [name] Date of Service: 06/24/2016 8:45 AM Medical Record Patient Account Number: [id number] Number: Treating RN: Phillis Haggis 1933/11/18 (82 y.o. Other Clinician: Date of Birth/Sex: Female) Treating Nelly Scriven Primary Care Provider: [name] Provider/Extender: G Referring Provider: Charolotte Capuchin in Treatment: 27 History of Present Illness Location: right posterior heel, right Achilles, right lower quadrant abdomen, right fourth toe amp site Quality: denies pain to any wound Severity: not applicable Timing: denies pain HPI Description: 83 year old patient who is known to be diabetic, was referred to [place] by Dr. Gavin Potters for a right heel ulceration which she's had for a while. She was recently in hospital for a pneumonia and at that time and got delirious and was disoriented and sometime during this time developed a stage II ulcer on her right heel. Her past medical history is significant for bilateral pneumonia which was treated with injectable antibiotics and then to oral Levaquin which he has completed. She also has acute on chronic diastolic CHF, acute on chronic respiratory failure, end-stage renal disease on hemodialysis, atrial fibrillation, recent stroke, diabetes mellitus. The patient and her son are poor historians but from what I understand she was admitted to the hospital with an acute vascular compromise of her right lower extremity and Dr.  Wyn Quaker has done a surgical procedure and we are trying to obtain these notes. There are also some vascular workup done and we will try and obtain these notes. the injury to the left lower quadrant of abdomen and the suprapubic area have been there due to a bruise and have been there for several months and no intervention has been done. 10/11/2015 -- on review  of the electronics records it was noted that the patient was admitted to the hospital on 09/14/2015 with peripheral vascular disease with claudication, end-stage renal disease, pressure ulcer, chronic atrial fibrillation. She was seen by Dr. Wyn Quaker who did her right lower extremity angiogram , angioplasty of the right anterior tibial artery and thrombolysis with TPA of the right popliteal artery, and thrombectomy. She was seen by Dr. Wyn Quaker during this past week and he was pleased with the progress. He did say that if he took her to the operating room for any procedure he would debride the abdominal wound under anesthesia. She was also seen by Dr. Ether Griffins the podiatrist who thought that she may lose her right fourth toe at some stage may need an amputation of this. 10/21/2015 --patient known to Dr. Wyn Quaker and his last office visit from 10/04/2015 has been reviewed. 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 She also had a persistent ulceration of the right heel and markedly this area and a right third and fourth toe and a small scab on the calf but these are dry and seemed to be improving. Patient also has a left carotid endarterectomy and multiple interventions to a right brachiocephalic AV fistula. After the visit he had recommended noninvasive studies to recheck her revascularization. He was off the impression that she would likely lose the right fourth toe and the third toe was likely to heal. [name] ([id number]) He was  concerned about underlying muscle necrosis on her right heel and midfoot. 11/01/2015 -- an echo done in January of this year showed her left ventricular ejection fraction to be about 50-55%. The patient was seen by the PA and Dr. Driscilla Grammes office and the plan was to take her to the operating room soon to have a debridement under anesthesia for the abdominal wall wound, the Achilles tendon on the right leg and amputation of the right fourth toe. The daughter and the patient do not feel that they would be able to undergo hyperbaric oxygen therapy 5 days a week for 6 weeks. 12/17/15; this is a medically complex woman who I note was recently in this clinic however I was not involved with her care. She returns today with multiple wounds; a) she has a wound in the mid abdomen that is been there since March of this year. I note that she is been to the overall for debridement recently. The exact etiology of this wound is not really clear b) left lower quadrant abdominal wound had some sanguinous drainage when she came in here. The patient fell in January and thinks this may have been secondary to a hematoma. c): The patient has 3 wounds on her right leg including a small wound on the right mid calf, a large area over the Achilles which currently has a wound VAC for the last 6 weeks, also a smaller wound on the distal part of the right heel. As far as I understand most of these wounds are currently been dressed with's calcium alginate. According to her daughter the Achilles wound under the wound VAC is doing well d) the patient is had an amputation of her left fifth toe in January and the right fourth toe 6 weeks ago secondary to diabetic PAD e) the patient has chronic renal failure on dialysis for the last 2 years secondary to type 2 diabetes on insulin. The daughter's knowledge there is been no  biopsy of the abdominal wounds given their current appearance and lack of undefined etiology at have to wonder  about calciphylaxis. 12/18/15:Addendum; I have reviewed [facility] I can see no relevant x-rays of the right heel. I note her arteriogram and revascularization of her right lower extremity in April 2017. She had debridement of both abdominal wounds and the right heel and Achilles wound on 11/07/15 at which time she had a right fourth toe ray amputation. The abdominal wounds were debridement again on 6/29. I do not see any relevant pathology of these abdominal wounds 12/24/15; culture I did of the drainage from the midline abdominal wound last week showed both Proteus and ampicillin sensitive enterococcus. I've given her a course of Augmentin adjusted on dialysis days. She has no specific complaints today. Been using Santyl to the abdominal wounds in the right leg wound and the wound VAC on the right Achilles which was initially prescribed by Dr. dew 12/31/15; I have done two punch biopsies of the large midline abdominal. 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 May have been a trauma component of the one on the left lower quadrant however the midline wound had no such history. She has a large area on the right Achilles heel with a wound VAC prescribed by Dr. dew. A small wound on the right anterior leg.Marland Kitchen. UNFORTUNATELY she has 2 new wounds today. One on the left heel which is probably a pressure area. As well her previous amputation site of her right fourth toe has dehisced and now has a small wound with significant depth at the amputation site. 01/14/2016 -- she returns after 2 weeks and had had a punch biopsy of abdominal wound done the last visit -- had a biopsy of her midline abdominal wound done and the Pathology diagnosis is that of ulceration, necrosis and inflammation and negative for dysplasia and malignancy. 01/21/16. 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 In the meantime she has new wounds of the left heel the right  fourth toe amputation site is opened up. The back is stopped to the right heel area. 01/28/16; the abdominal wounds continued to improve. The extensive wound on her right Achilles also looks stable except for the lower aspect of the wound where there is a large liquefied area that probes right down to her calcaneus. This cultured Proteus last week I have her on Augmentin and doxycycline 1. I think this is Natalie RimaHOPKINS, Breyonna Y. ([id number]) going to need a course of IV antibiotics and I will call dialysis. X-ray I did last week was negative, I think she is going to need an MRI 02/04/16; right heel MRI as before Saturday. Receiving I believe IV Rocephin at dialysis 02/11/16; as it turns out the patient could not have a MRI as she has a bladder stimulator in place even though it is not currently in use since the beginning of this year. Although she has an allergy to IV contrast she apparently has done well with premedication so we will have to go for a CT scan with contrast. In the meantime she has had a fall now has a large skin tear on her left upper arm. She went to the ER and they suggested Tegaderm over topical antibiotics 03/03/16 currently patient returns after having been hospitalized for 2 weeks and [facility]. She actually seems to be doing excellent compared to even when we last saw her according to our nursing staff. Both  patient and her daughter are extremely encouraged at how well she is presenting at this point in time. Overall the biggest issue is still the right Achilles area which is being managed at this point in time by Dr. Wyn Quaker. Patient is currently utilizing a wound VAC to that region. 03/17/16; patient is at [facility] nursing home still. Using Aquacel Ag to the wounds on her bilateral feet and still Prisma to the small open area on her abdomen. 03/31/16 at this point in time patient has been tolerating the dressing changes currently. She  fortunately has no worsening of her symptoms although she tells me that the nurse who is caring for her at [facility] nursing facility decided that nothing was needed in regard to the lower abdominal wound from a dressing standpoint at this time. she is really not having significant discomfort or pain at this point she continues to have some tunneling in the proximal Achilles wound region. 04/07/16 patient continues to do well on evaluation today. Even the Achilles wound which has been the most tender is not giving her as much trouble. Unfortunately the PolyMem dressings that we utilize last week really did not seem to benefit her in particular. Obviously we will discontinue that at this point in time today. 04-15-16:Ms. Montalban is accompanied today by her daughter. She is still residing in an SNF undergoing dialysis, continues to receive antibiotics during dialysis as prescribed by Dr. Sampson Goon of infectious disease. She has a follow-up appointment with Dr. Sampson Goon on 04-17-16 to discuss the continuation of these antibiotics. Ms. Smither denies any pain to any of the 3 remaining wounds she does admit to changing to [employer] surgical shoes for safety while ambulating with physical therapy. She denies any falls since her last appointment here although she and her daughter do admit to increased tremors of unclear etiology since her last appointment. She has tolerated the dressing changes that were prescribed last week. 04-22-16 Ms. Morelos presents today accompanied by her daughter for evaluation of her diabetic foot ulcers. She is still residing in an SNF, she continues dialysis 3 times weekly. She'll follow up with Dr. Sampson Goon last Friday, and at that appointment IV antibiotics were discontinued. According to Ms. Rozelle and her daughter if there is any regression of her wounds Dr. Sampson Goon will consider re-starting the antibiotics. Ms. Romanoff daughter states that since the discontinuation  of antibiotic therapy her "twitches" have resolved. 04/29/16; I have not seen this patient in quite some time however she is completed triple antibiotic therapy given at dialysis for osteomyelitis as prescribed by infectious disease. She is still being followed by Dr. dew of vascular surgery. We are using Hydrofera Blue to the surface of these wounds. She currently has 2 open areas a substantial area over her Achilles area although this is a lot better than the last time I saw this. She also has a small wound superiorly from this wound which has a superior probing depth of 2 cm. 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 My biopsy did not prove this nevertheless I think this is the correct clinical diagnosis. We are now following her for an area on the right Achilles part of her heel. This is not any different from last week. GIANNA, CALEF ([id number]) She also has a small tunneling area  just above this. And unfortunately this week there is been a reopening of an area where her right fourth toe was previously amputated. She is completed antibiotics 05/13/16; she has a new reopening on the mid abdominal wound in the same site is previously. This is a small open area. Apligraf #1 today to the areas on the right heel o2 still an open area in the fourth toe amputation site 05/27/16; the areas on her abdomen are completely closed over. Small open area from last time is closed. Apligraf #2 today to the areas on the right heel. The 4th toe amputation site is also heel 06/10/16; we did not have an Apligraf to put on today. This appeared tunneling wound on the right posterior calf appears to be closed. The more substantial area on her Achilles itself is improved. 06/24/16; I  reapplied her third Apligraf today although we have not had one debridement last time. The original tunneling wound is not as closed as last time. The more substantial area on her Achilles itself is improved with advancing epithelialization Electronic Signature(s) Signed: 06/24/2016 6:11:14 PM By: Baltazar Najjar MD Entered By: Baltazar Najjar on 06/24/2016 12:37:47 [name] ([id number]) -------------------------------------------------------------------------------- Physical Exam Details Patient Name: [name] Date of Service: 06/24/2016 8:45 AM Medical Record Patient Account Number: [id number] Number: Treating RN: Phillis Haggis 06-19-1933 (82 y.o. Other Clinician: Date of Birth/Sex: Female) Treating Jerrit Horen Primary Care Provider: [name] Provider/Extender: G Referring Provider: Charolotte Capuchin in Treatment: 27 Constitutional Sitting or standing Blood Pressure is within target range for patient.. Pulse regular and within target range for patient.Marland Kitchen Respirations regular, non-labored and within target range.. Temperature is normal and within the target range for the patient.. Patient's appearance is neat and clean. Appears in no acute distress. Well nourished and well developed.. Notes Wound exam; the area on her Achilles continues to improve although there is still that divot inferiorly with some depth. No debridement was required. I went ahead and applied Apligraf #3. Superiorly to this wound is a small open area that was initially her tunnel, I put a small amount of the Apligraf to over this as well Electronic Signature(s) Signed: 06/24/2016 6:11:14 PM By: Baltazar Najjar MD Entered By: Baltazar Najjar on 06/24/2016 12:40:24 [name] ([id number]) -------------------------------------------------------------------------------- Physician Orders Details Patient Name: [name]. Date of Service: 06/24/2016 8:45 AM Medical  Record Patient Account Number: [id number] Number: Treating RN: Phillis Haggis Dec 17, 1933 (82 y.o. Other Clinician: Date of Birth/Sex: Female) Treating Jamie-Lee Galdamez Primary Care Provider: [name] Provider/Extender: G Referring Provider: Charolotte Capuchin in Treatment: 24 Verbal / Phone Orders: Yes Clinician: Ashok Cordia, Debi Read Back and Verified: Yes Diagnosis Coding Wound Cleansing Wound #9 Right Achilles o Clean wound with Normal Saline. Anesthetic Wound #9 Right Achilles o Topical Lidocaine 4% cream applied to wound bed prior to debridement - all wounds for clinic use only Primary Wound Dressing Wound #9 Right Achilles o Other: - apligragh Secondary Dressing Wound #9 Right Achilles o ABD pad o XtraSorb o Other - charcoal Dressing Change Frequency Wound #9 Right Achilles o Three times weekly - HHRN to change only the outside dressing do not take off the steri-strips or the mepitel. Pt has a skin sub/graft on. Follow-up Appointments Wound #9 Right Achilles o Return Appointment in 1 week. - nurse visit o Return Appointment in 2 weeks. - MD visit Edema Control Wound #9 Right Achilles o Kerlix and Coban - Right Lower Extremity o Elevate legs to the  level of the heart and pump ankles as often as possible KYERA, FELAN ([id number]) Off-Loading Wound #9 Right Achilles o Other: - Float heels Advanced Therapies Wound #9 Right Achilles o Apligraf application in clinic; including contact layer, fixation with steri strips, dry gauze and cover dressing. Electronic Signature(s) Signed: 06/25/2016 3:37:28 PM By: Alejandro Mulling Signed: 06/30/2016 5:54:02 PM By: Baltazar Najjar MD Previous Signature: 06/24/2016 6:11:14 PM Version By: Baltazar Najjar MD Entered By: Alejandro Mulling on 06/25/2016 09:30:22 [name] ([id number]) -------------------------------------------------------------------------------- Problem List  Details Patient Name: IRLENE, CRUDUP. Date of Service: 06/24/2016 8:45 AM Medical Record Patient Account Number: [id number] Number: Treating RN: Phillis Haggis Apr 19, 1934 (82 y.o. Other Clinician: Date of Birth/Sex: Female) Treating Thandiwe Siragusa Primary Care Provider: [name] Provider/Extender: G Referring Provider: Charolotte Capuchin in Treatment: 27 Active Problems ICD-10 Encounter Code Description Active Date Diagnosis E11.621 Type 2 diabetes mellitus with foot ulcer 12/17/2015 Yes L97.512 Non-pressure chronic ulcer of other part of right foot with 03/31/2016 Yes fat layer exposed E11.51 Type 2 diabetes mellitus with diabetic peripheral 12/17/2015 Yes angiopathy without gangrene Inactive Problems ICD-10 Code Description Active Date Inactive Date M86.671 Other chronic osteomyelitis, right ankle and foot 04/15/2016 04/15/2016 S31.104A Unspecified open wound of abdominal wall, left lower 12/17/2015 12/17/2015 quadrant without penetration into peritoneal cavity, initial encounter S31.103D Unspecified open wound of abdominal wall, right lower 04/15/2016 04/15/2016 quadrant without penetration into peritoneal cavity, subsequent encounter Resolved Problems Electronic Signature(s) HARSHIKA, MAGO ([id number]) Signed: 06/24/2016 6:11:14 PM By: Baltazar Najjar MD Entered By: Baltazar Najjar on 06/24/2016 12:35:41 [name] ([id number]) -------------------------------------------------------------------------------- Progress Note Details Patient Name: [name] Date of Service: 06/24/2016 8:45 AM Medical Record Patient Account Number: [id number] Number: Treating RN: Phillis Haggis 1933-10-11 (82 y.o. Other Clinician: Date of Birth/Sex: Female) Treating Hoa Briggs Primary Care Provider: [name] Provider/Extender: G Referring Provider: Charolotte Capuchin in Treatment: 27 Subjective Chief Complaint Information  obtained from Patient Ms. Bittman presents today for follow-up evaluation of her diabetic foot ulcers and abdominal wound. History of Present Illness (HPI) The following HPI elements were documented for the patient's wound: Location: right posterior heel, right Achilles, right lower quadrant abdomen, right fourth toe amp site Quality: denies pain to any wound Severity: not applicable Timing: denies pain 83 year old patient who is known to be diabetic, was referred to [place] by Dr. Gavin Potters for a right heel ulceration which she's had for a while. She was recently in hospital for a pneumonia and at that time and got delirious and was disoriented and sometime during this time developed a stage II ulcer on her right heel. Her past medical history is significant for bilateral pneumonia which was treated with injectable antibiotics and then to oral Levaquin which he has completed. She also has acute on chronic diastolic CHF, acute on chronic respiratory failure, end-stage renal disease on hemodialysis, atrial fibrillation, recent stroke, diabetes mellitus. The patient and her son are poor historians but from what I understand she was admitted to the hospital with an acute vascular compromise of her right lower extremity and Dr. Wyn Quaker has done a surgical procedure and we are trying to obtain these notes. There are also some vascular workup done and we will try and obtain these notes. the injury to the left lower quadrant of abdomen and the suprapubic area have been there due to a bruise and have been there for several months and no intervention has been done. 10/11/2015 -- on review of the [facility] records it  was noted that the patient was admitted to the hospital on 09/14/2015 with peripheral vascular disease with claudication, end-stage renal disease, pressure ulcer, chronic atrial fibrillation. She was seen by Dr. Wyn Quaker who did her right lower extremity angiogram , angioplasty of the right anterior  tibial artery and thrombolysis with TPA of the right popliteal artery, and thrombectomy. She was seen by Dr. Wyn Quaker during this past week and he was pleased with the progress. He did say that if he took her to the operating room for any procedure he would debride the abdominal wound under anesthesia. She was also seen by Dr. Ether Griffins the podiatrist who thought that she may lose her right fourth toe at some stage may need an amputation of this. LILEY, RAKE ([id number]) 10/21/2015 --patient known to Dr. Wyn Quaker and his last office visit from 10/04/2015 has been reviewed. 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 She also had a persistent ulceration of the right heel and markedly this area and a right third and fourth toe and a small scab on the calf but these are dry and seemed to be improving. Patient also has a left carotid endarterectomy and multiple interventions to a right brachiocephalic AV fistula. After the visit he had recommended noninvasive studies to recheck her revascularization. He was off the impression that she would likely lose the right fourth toe and the third toe was likely to heal. He was concerned about underlying muscle necrosis on her right heel and midfoot. 11/01/2015 -- an echo done in January of this year showed her left ventricular ejection fraction to be about 50-55%. The patient was seen by the PA and Dr. Driscilla Grammes office and the plan was to take her to the operating room soon to have a debridement under anesthesia for the abdominal wall wound, the Achilles tendon on the right leg and amputation of the right fourth toe. The daughter and the patient do not feel that they would be able to undergo hyperbaric oxygen therapy 5 days a week for 6 weeks. 12/17/15; this is a medically complex woman who I note was recently in this clinic however I was not  involved with her care. She returns today with multiple wounds; a) she has a wound in the mid abdomen that is been there since March of this year. I note that she is been to the overall for debridement recently. The exact etiology of this wound is not really clear b) left lower quadrant abdominal wound had some sanguinous drainage when she came in here. The patient fell in January and thinks this may have been secondary to a hematoma. c): The patient has 3 wounds on her right leg including a small wound on the right mid calf, a large area over the Achilles which currently has a wound VAC for the last 6 weeks, also a smaller wound on the distal part of the right heel. As far as I understand most of these wounds are currently been dressed with's calcium alginate. According to her daughter the Achilles wound under the wound VAC is doing well d) the patient is had an amputation of her left fifth toe in January and the right fourth toe 6 weeks ago secondary to diabetic PAD e) the patient has chronic renal failure on dialysis for the last 2 years secondary to type 2 diabetes on insulin. The daughter's knowledge there is been no biopsy of the abdominal  wounds given their current appearance and lack of undefined etiology at have to wonder about calciphylaxis. 12/18/15:Addendum; I have reviewed [facility] I can see no relevant x-rays of the right heel. I note her arteriogram and revascularization of her right lower extremity in April 2017. She had debridement of both abdominal wounds and the right heel and Achilles wound on 11/07/15 at which time she had a right fourth toe ray amputation. The abdominal wounds were debridement again on 6/29. I do not see any relevant pathology of these abdominal wounds 12/24/15; culture I did of the drainage from the midline abdominal wound last week showed both Proteus and ampicillin sensitive enterococcus. I've given her a course of Augmentin adjusted on dialysis  days. She has no specific complaints today. Been using Santyl to the abdominal wounds in the right leg wound and the wound VAC on the right Achilles which was initially prescribed by Dr. dew 12/31/15; I have done two punch biopsies of the large midline abdominal. 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 May have been a trauma component of the one on the left lower quadrant however the midline wound had no such history. She has a large area on the right Achilles heel with a wound VAC prescribed by Dr. dew. A small wound on the right anterior leg.Marland Kitchen UNFORTUNATELY she has 2 new wounds today. One on the left heel which is probably a pressure area. As well her previous amputation site of her right fourth toe has dehisced and now has a small wound with significant depth at the amputation site. 01/14/2016 -- she returns after 2 weeks and had had a punch biopsy of abdominal wound done the last visit CHANISE, HABECK ([id number]) -- had a biopsy of her midline abdominal wound done and the Pathology diagnosis is that of ulceration, necrosis and inflammation and negative for dysplasia and malignancy. 01/21/16. 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 In the meantime she has new wounds of the left heel the right fourth toe amputation site is opened up. The back is stopped to the right heel area. 01/28/16; the abdominal wounds continued to improve. The extensive wound on her right Achilles also looks stable except for the lower aspect of the wound where there is a large liquefied area that probes right down to her calcaneus. This cultured Proteus last week I have her on Augmentin and doxycycline 1. I think this is going to need a course of IV antibiotics and I will call dialysis. X-ray I did last week was negative, I think she is going to need an MRI 02/04/16; right heel MRI as before Saturday. Receiving I believe IV Rocephin at dialysis 02/11/16;  as it turns out the patient could not have a MRI as she has a bladder stimulator in place even though it is not currently in use since the beginning of this year. Although she has an allergy to IV contrast she apparently has done well with premedication so we will have to go for a CT scan with contrast. In the meantime she has had a fall now has a large skin tear on her left upper arm. She went to the ER and they suggested Tegaderm over topical antibiotics 03/03/16 currently patient returns after having been hospitalized for 2 weeks and subsequently transferred to [facility] nursing facility. She actually seems to be doing excellent compared to even when we last saw her according to our nursing staff. Both patient and her daughter  are extremely encouraged at how well she is presenting at this point in time. Overall the biggest issue is still the right Achilles area which is being managed at this point in time by Dr. Wyn Quaker. Patient is currently utilizing a wound VAC to that region. 03/17/16; patient is at [facility] nursing home still. Using Aquacel Ag to the wounds on her bilateral feet and still Prisma to the small open area on her abdomen. 03/31/16 at this point in time patient has been tolerating the dressing changes currently. She fortunately has no worsening of her symptoms although she tells me that the nurse who is caring for her at [facility] nursing facility decided that nothing was needed in regard to the lower abdominal wound from a dressing standpoint at this time. she is really not having significant discomfort or pain at this point she continues to have some tunneling in the proximal Achilles wound region. 04/07/16 patient continues to do well on evaluation today. Even the Achilles wound which has been the most tender is not giving her as much trouble. Unfortunately the PolyMem dressings that we utilize last week really did not seem to benefit her in particular. Obviously we will discontinue  that at this point in time today. 04-15-16:Ms. Paiva is accompanied today by her daughter. She is still residing in an SNF undergoing dialysis, continues to receive antibiotics during dialysis as prescribed by Dr. Sampson Goon of infectious disease. She has a follow-up appointment with Dr. Sampson Goon on 04-17-16 to discuss the continuation of these antibiotics. Ms. Osterloh denies any pain to any of the 3 remaining wounds she does admit to changing to [employer] surgical shoes for safety while ambulating with physical therapy. She denies any falls since her last appointment here although she and her daughter do admit to increased tremors of unclear etiology since her last appointment. She has tolerated the dressing changes that were prescribed last week. 04-22-16 Ms. Totherow presents today accompanied by her daughter for evaluation of her diabetic foot ulcers. She is still residing in an SNF, she continues dialysis 3 times weekly. She'll follow up with Dr. Sampson Goon last Friday, and at that appointment IV antibiotics were discontinued. According to Ms. Piechocki and her daughter if there is any regression of her wounds Dr. Sampson Goon will consider re-starting the antibiotics. Ms. Mchaney daughter states that since the discontinuation of antibiotic therapy her "twitches" have resolved. 04/29/16; I have not seen this patient in quite some time however she is completed triple antibiotic therapy given at dialysis for osteomyelitis as prescribed by infectious disease. She is still being followed by Dr. Wyn Quaker [name] ([id number]) of vascular surgery. We are using Hydrofera Blue to the surface of these wounds. She currently has 2 open areas a substantial area over her Achilles area although this is a lot better than the last time I saw this. She also has a small wound superiorly from this wound which has a superior probing depth of 2 cm. 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 My biopsy did not prove this nevertheless I think this is the correct clinical diagnosis. We are now following her for an area on the right Achilles part of her heel. This is not any different from last week. She also has a small tunneling area just above this. And  unfortunately this week there is been a reopening of an area where her right fourth toe was previously amputated. She is completed antibiotics 05/13/16; she has a new reopening on the mid abdominal wound in the same site is previously. This is a small open area. Apligraf #1 today to the areas on the right heel o2 still an open area in the fourth toe amputation site 05/27/16; the areas on her abdomen are completely closed over. Small open area from last time is closed. Apligraf #2 today to the areas on the right heel. The 4th toe amputation site is also heel 06/10/16; we did not have an Apligraf to put on today. This appeared tunneling wound on the right posterior calf appears to be closed. The more substantial area on her Achilles itself is improved. 06/24/16; I reapplied her third Apligraf today although we have not had one debridement last time. The original tunneling wound is not as closed as last time. The more substantial area on her Achilles itself is improved with advancing epithelialization Objective Constitutional Sitting or standing Blood Pressure is within target range for patient.. Pulse regular and within target range for patient.Marland Kitchen Respirations regular, non-labored and within target range.. Temperature is normal and within the target range for the patient.. Patient's appearance is neat and clean. Appears in no acute distress. Well nourished and well developed.. Vitals Time Taken: 9:19 AM, Height: 65 in, Weight: 160 lbs,  BMI: 26.6, Pulse: 77 bpm, Respiratory Rate: 16 breaths/min, Blood Pressure: 123/46 mmHg. General Notes: Wound exam; the area on her Achilles continues to improve although there is still that divot inferiorly with some depth. No debridement was required. I went ahead and applied Apligraf #3. Superiorly to this wound is a small open area that was initially her tunnel, I put a small amount of the Apligraf to over this as well Integumentary (Hair, Skin) Wound #9 status is Open. Original cause of wound was Gradually Appeared. The wound is located on the Right Achilles. The wound measures 4.5cm length x 1.5cm width x 0.2cm depth; 5.301cm^2 area and 1.06cm^3 volume. There is no tunneling or undermining noted. There is a large amount of serosanguineous drainage noted. The wound margin is distinct with the outline attached to the wound base. There is large (67-100%) red, pink granulation within the wound bed. There is a small (1-33%) amount of necrotic tissue LATANGELA, MCCOMAS ([id number]) within the wound bed including Adherent Slough. Periwound temperature was noted as No Abnormality. The periwound has tenderness on palpation. Assessment Active Problems ICD-10 E11.621 - Type 2 diabetes mellitus with foot ulcer L97.512 - Non-pressure chronic ulcer of other part of right foot with fat layer exposed E11.51 - Type 2 diabetes mellitus with diabetic peripheral angiopathy without gangrene Procedures Wound #9 Wound #9 is a Diabetic Wound/Ulcer of the Lower Extremity located on the Right Achilles. A skin graft procedure using a bioengineered skin substitute/cellular or tissue based product was performed by Maxwell Caul, MD. Apligraf was applied and secured with Steri-Strips. 6.75 sq cm of product was utilized and 37.25 sq cm was wasted due to wound size. Post Application, MEPITEL was applied. A Time Out was conducted at 09:46, prior to the start of the procedure. The procedure was tolerated well with  a pain level of 0 throughout and a pain level of 0 following the procedure. Post procedure Diagnosis Wound #9: Same as Pre-Procedure . Plan Wound Cleansing: Wound #9 Right Achilles: Clean wound with Normal Saline. Anesthetic: Wound #9 Right Achilles: Topical Lidocaine  4% cream applied to wound bed prior to debridement - all wounds for clinic use only Primary Wound Dressing: Wound #9 Right Achilles: Other: - apligragh Secondary Dressing: Wound #9 Right Achilles: HAILYN, ZARR ([id number]) ABD pad XtraSorb Other - charcoal Dressing Change Frequency: Wound #9 Right Achilles: Three times weekly - HHRN to change only the outside dressing do not take off the steri-strips or the mepitel. Pt has a skin sub/graft on. Follow-up Appointments: Wound #9 Right Achilles: Return Appointment in 1 week. - nurse visit Return Appointment in 2 weeks. - MD visit Edema Control: Wound #9 Right Achilles: Kerlix and Coban - Right Lower Extremity Elevate legs to the level of the heart and pump ankles as often as possible Off-Loading: Wound #9 Right Achilles: Other: - Float heels Advanced Therapies: Wound #9 Right Achilles: Apligraf application in clinic; including contact layer, fixation with steri strips, dry gauze and cover dressing. Appligraf number 3 to both wound areas instructions for home health to change the outer dressing in 2 weeks and then see [place] again in 2 weeks Electronic Signature(s) Signed: 06/26/2016 8:01:29 AM By: Elliot Gurney RN, BSN, Kim RN, BSN Signed: 06/30/2016 5:54:02 PM By: Baltazar Najjar MD Previous Signature: 06/24/2016 6:11:14 PM Version By: Baltazar Najjar MD Entered By: Elliot Gurney, RN, BSN, Kim on 06/26/2016 08:01:28 RIDA, LOUDIN ([id number]) -------------------------------------------------------------------------------- SuperBill Details Patient Name: MALACHI, SUDERMAN. Date of Service: 06/24/2016 Medical Record Patient Account Number:  [id number] Number: Treating RN: Phillis Haggis [birth year] (82 y.o. Other Clinician: Date of Birth/Sex: Female) Treating Mariafernanda Hendricksen Primary Care Provider: [name] Provider/Extender: G Referring Provider: Charolotte Capuchin in Treatment: 27 Diagnosis Coding ICD-10 Codes Code Description E11.621 Type 2 diabetes mellitus with foot ulcer L97.512 Non-pressure chronic ulcer of other part of right foot with fat layer exposed E11.51 Type 2 diabetes mellitus with diabetic peripheral angiopathy without gangrene Facility Procedures CPT4 Code Description: [id number] (Facility Use Only) Apligraf 44 SQ CM Modifier: Quantity: 44 CPT4 Code Description: [id number] 15271 - SKIN SUB GRAFT TRNK/ARM/LEG ICD-10 Description Diagnosis E11.621 Type 2 diabetes mellitus with foot ulcer L97.512 Non-pressure chronic ulcer of other part of right foo Modifier: t with fat la Quantity: 1 yer exposed Physician Procedures CPT4 Code Description: [id number] - WC PHYS SKIN SUB GRAFT TRNK/ARM/LEG ICD-10 Description Diagnosis E11.621 Type 2 diabetes mellitus with foot ulcer L97.512 Non-pressure chronic ulcer of other part of right foot Modifier: with fat lay Quantity: 1 er exposed Electronic Signature(s) Signed: 06/24/2016 6:11:14 PM By: Baltazar Najjar MD Entered By: Baltazar Najjar on 06/24/2016 12:42:22

## 2016-07-08 ENCOUNTER — Encounter: Payer: Medicare Other | Attending: Internal Medicine | Admitting: Internal Medicine

## 2016-07-08 DIAGNOSIS — E11621 Type 2 diabetes mellitus with foot ulcer: Secondary | ICD-10-CM | POA: Insufficient documentation

## 2016-07-08 DIAGNOSIS — I482 Chronic atrial fibrillation: Secondary | ICD-10-CM | POA: Insufficient documentation

## 2016-07-08 DIAGNOSIS — Z8673 Personal history of transient ischemic attack (TIA), and cerebral infarction without residual deficits: Secondary | ICD-10-CM | POA: Insufficient documentation

## 2016-07-08 DIAGNOSIS — I132 Hypertensive heart and chronic kidney disease with heart failure and with stage 5 chronic kidney disease, or end stage renal disease: Secondary | ICD-10-CM | POA: Insufficient documentation

## 2016-07-08 DIAGNOSIS — L97512 Non-pressure chronic ulcer of other part of right foot with fat layer exposed: Secondary | ICD-10-CM | POA: Diagnosis not present

## 2016-07-08 DIAGNOSIS — E1151 Type 2 diabetes mellitus with diabetic peripheral angiopathy without gangrene: Secondary | ICD-10-CM | POA: Diagnosis not present

## 2016-07-08 DIAGNOSIS — N186 End stage renal disease: Secondary | ICD-10-CM | POA: Insufficient documentation

## 2016-07-08 DIAGNOSIS — E1122 Type 2 diabetes mellitus with diabetic chronic kidney disease: Secondary | ICD-10-CM | POA: Insufficient documentation

## 2016-07-08 DIAGNOSIS — I5033 Acute on chronic diastolic (congestive) heart failure: Secondary | ICD-10-CM | POA: Diagnosis not present

## 2016-07-08 DIAGNOSIS — Z992 Dependence on renal dialysis: Secondary | ICD-10-CM | POA: Diagnosis not present

## 2016-07-08 DIAGNOSIS — Z794 Long term (current) use of insulin: Secondary | ICD-10-CM | POA: Insufficient documentation

## 2016-07-09 NOTE — Progress Notes (Signed)
Natalie, [name] ([id number]) Visit Report for 07/08/2016 Chief Complaint Document Details Patient Name: [name], [name]. Date of Service: 07/08/2016 8:45 AM Medical Record Patient Account Number: [id number] Number: Treating RN: Huel Coventry Apr 25, 1934 (82 y.o. Other Clinician: Date of Birth/Sex: Female) Treating Kashmir Leedy Primary Care Provider: [name] Provider/Extender: G Referring Provider: Charolotte Capuchin in Treatment: 29 Information Obtained from: Patient Chief Complaint Ms. Sorg presents today for follow-up evaluation of her diabetic foot ulcers and abdominal wound. Electronic Signature(s) Signed: 07/08/2016 4:28:51 PM By: Baltazar Najjar MD Entered By: Baltazar Najjar on 07/08/2016 12:09:13 [name] ([id number]) -------------------------------------------------------------------------------- Cellular or Tissue Based Product Details Patient Name: [name], [name]. Date of Service: 07/08/2016 8:45 AM Medical Record Patient Account Number: [id number] Number: Treating RN: Huel Coventry 11-20-1933 (82 y.o. Other Clinician: Date of Birth/Sex: Female) Treating Fermin Yan Primary Care Provider: [name] Provider/Extender: G Referring Provider: Charolotte Capuchin in Treatment: 29 Cellular or Tissue Based Wound #9 Right Achilles Product Type Applied to: Performed By: Physician Maxwell Caul, MD Cellular or Tissue Based Apligraf Product Type: Pre-procedure Yes - 09:22 Verification/Time Out Taken: Location: trunk / arms / legs Wound Size (sq cm): 2.88 Product Size (sq cm): 44 Waste Size (sq cm): 41 Waste Reason: product size Amount of Product Applied (sq cm): 3 Lot #: [id number] Expiration Date: 07/15/2016 Fenestrated: Yes Instrument: Blade Reconstituted: No Secured: Yes Secured With: Steri-Strips Dressing Applied: Yes Primary Dressing: mepitel Procedural Pain: 0 Post Procedural Pain: 0 Response to  Treatment: Procedure was tolerated well Post Procedure Diagnosis Same as Pre-procedure Electronic Signature(s) Signed: 07/08/2016 4:28:51 PM By: Baltazar Najjar MD Entered By: Baltazar Najjar on 07/08/2016 12:09:02 [name] ([id number]) -------------------------------------------------------------------------------- HPI Details Patient Name: [name] Date of Service: 07/08/2016 8:45 AM Medical Record Patient Account Number: [id number] Number: Treating RN: Huel Coventry 06-05-1933 (82 y.o. Other Clinician: Date of Birth/Sex: Female) Treating Jon Lall Primary Care Provider: [name] Provider/Extender: G Referring Provider: Charolotte Capuchin in Treatment: 29 History of Present Illness Location: right posterior heel, right Achilles, right lower quadrant abdomen, right fourth toe amp site Quality: denies pain to any wound Severity: not applicable Timing: denies pain HPI Description: 83 year old patient who is known to be diabetic, was referred to [place] by Dr. Gavin Potters for a right heel ulceration which she's had for a while. She was recently in hospital for a pneumonia and at that time and got delirious and was disoriented and sometime during this time developed a stage II ulcer on her right heel. Her past medical history is significant for bilateral pneumonia which was treated with injectable antibiotics and then to oral Levaquin which he has completed. She also has acute on chronic diastolic CHF, acute on chronic respiratory failure, end-stage renal disease on hemodialysis, atrial fibrillation, recent stroke, diabetes mellitus. The patient and her son are poor historians but from what I understand she was admitted to the hospital with an acute vascular compromise of her right lower extremity and Dr. Wyn Quaker has done a surgical procedure and we are trying to obtain these notes. There are also some vascular workup done and we will try and obtain these notes.  the injury to the left lower quadrant of abdomen and the suprapubic area have been there due to a bruise and have been there for several months and no intervention has been done. 10/11/2015 -- on review of the electronics records it was noted that the patient was admitted to the hospital on 09/14/2015 with peripheral  vascular disease with claudication, end-stage renal disease, pressure ulcer, chronic atrial fibrillation. She was seen by Dr. Wyn Quaker who did her right lower extremity angiogram , angioplasty of the right anterior tibial artery and thrombolysis with TPA of the right popliteal artery, and thrombectomy. She was seen by Dr. Wyn Quaker during this past week and he was pleased with the progress. He did say that if he took her to the operating room for any procedure he would debride the abdominal wound under anesthesia. She was also seen by Dr. Ether Griffins the podiatrist who thought that she may lose her right fourth toe at some stage may need an amputation of this. 10/21/2015 --patient known to Dr. Wyn Quaker and his last office visit from 10/04/2015 has been reviewed. 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 She also had a persistent ulceration of the right heel and markedly this area and a right third and fourth toe and a small scab on the calf but these are dry and seemed to be improving. Patient also has a left carotid endarterectomy and multiple interventions to a right brachiocephalic AV fistula. After the visit he had recommended noninvasive studies to recheck her revascularization. He was off the impression that she would likely lose the right fourth toe and the third toe was likely to heal. [name] ([id number]) He was concerned about underlying muscle necrosis on her right heel and midfoot. 11/01/2015 -- an echo done in January of this year showed her left ventricular  ejection fraction to be about 50-55%. The patient was seen by the PA and Dr. Driscilla Grammes office and the plan was to take her to the operating room soon to have a debridement under anesthesia for the abdominal wall wound, the Achilles tendon on the right leg and amputation of the right fourth toe. The daughter and the patient do not feel that they would be able to undergo hyperbaric oxygen therapy 5 days a week for 6 weeks. 12/17/15; this is a medically complex woman who I note was recently in this clinic however I was not involved with her care. She returns today with multiple wounds; a) she has a wound in the mid abdomen that is been there since March of this year. I note that she is been to the overall for debridement recently. The exact etiology of this wound is not really clear b) left lower quadrant abdominal wound had some sanguinous drainage when she came in here. The patient fell in January and thinks this may have been secondary to a hematoma. c): The patient has 3 wounds on her right leg including a small wound on the right mid calf, a large area over the Achilles which currently has a wound VAC for the last 6 weeks, also a smaller wound on the distal part of the right heel. As far as I understand most of these wounds are currently been dressed with's calcium alginate. According to her daughter the Achilles wound under the wound VAC is doing well d) the patient is had an amputation of her left fifth toe in January and the right fourth toe 6 weeks ago secondary to diabetic PAD e) the patient has chronic renal failure on dialysis for the last 2 years secondary to type 2 diabetes on insulin. The daughter's knowledge there is been no biopsy of the abdominal wounds given their current appearance and lack of undefined etiology at have to wonder about  calciphylaxis. 12/18/15:Addendum; I [facility] I can see no relevant x-rays of the right heel. I note her arteriogram and  revascularization of her right lower extremity in April 2017. She had debridement of both abdominal wounds and the right heel and Achilles wound on 11/07/15 at which time she had a right fourth toe ray amputation. The abdominal wounds were debridement again on 6/29. I do not see any relevant pathology of these abdominal wounds 12/24/15; culture I did of the drainage from the midline abdominal wound last week showed both Proteus and ampicillin sensitive enterococcus. I've given her a course of Augmentin adjusted on dialysis days. She has no specific complaints today. Been using Santyl to the abdominal wounds in the right leg wound and the wound VAC on the right Achilles which was initially prescribed by Dr. dew 12/31/15; I have done two punch biopsies of the large midline abdominal. 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 May have been a trauma component of the one on the left lower quadrant however the midline wound had no such history. She has a large area on the right Achilles heel with a wound VAC prescribed by Dr. dew. A small wound on the right anterior leg.Marland Kitchen UNFORTUNATELY she has 2 new wounds today. One on the left heel which is probably a pressure area. As well her previous amputation site of her right fourth toe has dehisced and now has a small wound with significant depth at the amputation site. 01/14/2016 -- she returns after 2 weeks and had had a punch biopsy of abdominal wound done the last visit -- had a biopsy of her midline abdominal wound done and the Pathology diagnosis is that of ulceration, necrosis and inflammation and negative for dysplasia and malignancy. 01/21/16. 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 In the meantime she has new wounds of the left heel the right fourth toe amputation site is opened up. The back is stopped to the right heel area. 01/28/16; the abdominal wounds continued to improve. The extensive  wound on her right Achilles also looks stable except for the lower aspect of the wound where there is a large liquefied area that probes right down to her calcaneus. This cultured Proteus last week I have her on Augmentin and doxycycline 1. I think this is ASLIN, FARINAS ([id number]) going to need a course of IV antibiotics and I will call dialysis. X-ray I did last week was negative, I think she is going to need an MRI 02/04/16; right heel MRI as before Saturday. Receiving I believe IV Rocephin at dialysis 02/11/16; as it turns out the patient could not have a MRI as she has a bladder stimulator in place even though it is not currently in use since the beginning of this year. Although she has an allergy to IV contrast she apparently has done well with premedication so we will have to go for a CT scan with contrast. In the meantime she has had a fall now has a large skin tear on her left upper arm. She went to the ER and they suggested Tegaderm over topical antibiotics 03/03/16 currently patient returns after having been hospitalized for 2 weeks and subsequently transferred to [facility] nursing facility. She actually seems to be doing excellent compared to even when we last saw her according to our nursing staff. Both patient and her daughter are extremely encouraged at how well she is presenting at this point in time. Overall  the biggest issue is still the right Achilles area which is being managed at this point in time by Dr. Wyn Quakerew. Patient is currently utilizing a wound VAC to that region. 03/17/16; [facility]. Using Aquacel Ag to the wounds on her bilateral feet and still Prisma to the small open area on her abdomen. 03/31/16 at this point in time patient has been tolerating the dressing changes currently. She fortunately has no worsening of her symptoms although she tells me that the nurse who is caring [facility] decided that nothing was  needed in regard to the lower abdominal wound from a dressing standpoint at this time. she is really not having significant discomfort or pain at this point she continues to have some tunneling in the proximal Achilles wound region. 04/07/16 patient continues to do well on evaluation today. Even the Achilles wound which has been the most tender is not giving her as much trouble. Unfortunately the PolyMem dressings that we utilize last week really did not seem to benefit her in particular. Obviously we will discontinue that at this point in time today. 04-15-16:Ms. Parkin is accompanied today by her daughter. She is still residing in an SNF undergoing dialysis, continues to receive antibiotics during dialysis as prescribed by Dr. Sampson GoonFitzgerald of infectious disease. She has a follow-up appointment with Dr. Sampson GoonFitzgerald on 04-17-16 to discuss the continuation of these antibiotics. Ms. Abbe AmsterdamHopkins denies any pain to any of the 3 remaining wounds she does admit to changing to [employer] surgical shoes for safety while ambulating with physical therapy. She denies any falls since her last appointment here although she and her daughter do admit to increased tremors of unclear etiology since her last appointment. She has tolerated the dressing changes that were prescribed last week. 04-22-16 Ms. Schadt presents today accompanied by her daughter for evaluation of her diabetic foot ulcers. She is still residing in an SNF, she continues dialysis 3 times weekly. She'll follow up with Dr. Sampson GoonFitzgerald last Friday, and at that appointment IV antibiotics were discontinued. According to Ms. Minervini and her daughter if there is any regression of her wounds Dr. Sampson GoonFitzgerald will consider re-starting the antibiotics. Ms. Abbe AmsterdamHopkins daughter states that since the discontinuation of antibiotic therapy her "twitches" have resolved. 04/29/16; I have not seen this patient in quite some time however she is completed triple antibiotic  therapy given at dialysis for osteomyelitis as prescribed by infectious disease. She is still being followed by Dr. dew of vascular surgery. We are using Hydrofera Blue to the surface of these wounds. She currently has 2 open areas a substantial area over her Achilles area although this is a lot better than the last time I saw this. She also has a small wound superiorly from this wound which has a superior probing depth of 2 cm. 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 My biopsy did not prove this nevertheless I think this is the correct clinical diagnosis. We are now following her for an area on the right Achilles part of her heel. This is not any different from last week. Natalie RimaHOPKINS, Bailyn Y. ([id number]) She also has a small tunneling area just above this. And unfortunately this week there is been a reopening of an area where her right fourth  toe was previously amputated. She is completed antibiotics 05/13/16; she has a new reopening on the mid abdominal wound in the same site is previously. This is a small open area. Apligraf #1 today to the areas on the right heel o2 still an open area in the fourth toe amputation site 05/27/16; the areas on her abdomen are completely closed over. Small open area from last time is closed. Apligraf #2 today to the areas on the right heel. The 4th toe amputation site is also heel 06/10/16; we did not have an Apligraf to put on today. This appeared tunneling wound on the right posterior calf appears to be closed. The more substantial area on her Achilles itself is improved. 06/24/16; I reapplied her third Apligraf today although we have not had one debridement last time. The original tunneling wound is not as closed as last time. The more  substantial area on her Achilles itself is improved with advancing epithelialization 07/18/16 Apligraf #4 today. The original long area over her Achilles as a healthy-looking area at the superior aspect and a small divot inferiorly. Both of the wound surface is look healthy Electronic Signature(s) Signed: 07/08/2016 4:28:51 PM By: Baltazar Najjar MD Entered By: Baltazar Najjar on 07/08/2016 12:10:32 [name] ([id number]) -------------------------------------------------------------------------------- Physical Exam Details Patient Name: [name] Date of Service: 07/08/2016 8:45 AM Medical Record Patient Account Number: [id number] Number: Treating RN: Huel Coventry [birth year] (82 y.o. Other Clinician: Date of Birth/Sex: Female) Treating Jarnell Cordaro Primary Care Provider: [name] Provider/Extender: G Referring Provider: Charolotte Capuchin in Treatment: 29 Constitutional Sitting or standing Blood Pressure is within target range for patient.. Pulse regular and within target range for patient.Marland Kitchen Respirations regular, non-labored and within target range.. Temperature is normal and within the target range for the patient.. Patient's appearance is neat and clean. Appears in no acute distress. Well nourished and well developed.. Cardiovascular Pedal pulses palpable and strong bilaterally.. Notes Wound exam; the area on her Achilles continues to improve over the small divot inferiorly still has depth. No debridement was required. Apligraf #4 applied. The small area superiorly that was at tunneling area remains closed. Electronic Signature(s) Signed: 07/08/2016 4:28:51 PM By: Baltazar Najjar MD Entered By: Baltazar Najjar on 07/08/2016 12:11:31 [name] ([id number]) -------------------------------------------------------------------------------- Physician Orders Details Patient Name: KINSLEA, FRANCES. Date of Service: 07/08/2016 8:45 AM Medical Record  Patient Account Number: [id number] Number: Treating RN: Huel Coventry August 31, 1933 (82 y.o. Other Clinician: Date of Birth/Sex: Female) Treating Kaliann Coryell Primary Care Provider: [name] Provider/Extender: G Referring Provider: Charolotte Capuchin in Treatment: 6 Verbal / Phone Orders: No Diagnosis Coding Wound Cleansing Wound #9 Right Achilles o Clean wound with Normal Saline. Anesthetic Wound #9 Right Achilles o Topical Lidocaine 4% cream applied to wound bed prior to debridement - all wounds for clinic use only Primary Wound Dressing Wound #9 Right Achilles o Other: - Apligraf Secondary Dressing Wound #9 Right Achilles o XtraSorb o Other - charcoal Dressing Change Frequency Wound #9 Right Achilles o Three times weekly - HHRN to change only the outside dressing do not take off the steri-strips or the mepitel. Pt has a skin sub/graft on. Follow-up Appointments Wound #9 Right Achilles o Return Appointment in 1 week. - nurse visit o Return Appointment in 2 weeks. - MD visit Edema Control Wound #9 Right Achilles o Kerlix and Coban - Right Lower Extremity o Elevate legs to the level of the heart and pump ankles as often as possible Litle, Chyrl Civatte  Y. ([id number]) Off-Loading Wound #9 Right Achilles o Other: - Float heels Advanced Therapies Wound #9 Right Achilles o Apligraf application in clinic; including contact layer, fixation with steri strips, dry gauze and cover dressing. Electronic Signature(s) Signed: 07/08/2016 4:28:51 PM By: Baltazar Najjar MD Signed: 07/08/2016 5:04:45 PM By: Elliot Gurney RN, BSN, Kim RN, BSN Entered By: Elliot Gurney, RN, BSN, Kim on 07/08/2016 09:56:07 [name] ([id number]) -------------------------------------------------------------------------------- Problem List Details Patient Name: VAANI, MORREN. Date of Service: 07/08/2016 8:45 AM Medical Record Patient Account Number:  [id number] Number: Treating RN: Huel Coventry [birth year] (82 y.o. Other Clinician: Date of Birth/Sex: Female) Treating Hally Colella Primary Care Provider: [name] Provider/Extender: G Referring Provider: Charolotte Capuchin in Treatment: 29 Active Problems ICD-10 Encounter Code Description Active Date Diagnosis E11.621 Type 2 diabetes mellitus with foot ulcer 12/17/2015 Yes L97.512 Non-pressure chronic ulcer of other part of right foot with 03/31/2016 Yes fat layer exposed E11.51 Type 2 diabetes mellitus with diabetic peripheral 12/17/2015 Yes angiopathy without gangrene Inactive Problems ICD-10 Code Description Active Date Inactive Date M86.671 Other chronic osteomyelitis, right ankle and foot 04/15/2016 04/15/2016 S31.104A Unspecified open wound of abdominal wall, left lower 12/17/2015 12/17/2015 quadrant without penetration into peritoneal cavity, initial encounter S31.103D Unspecified open wound of abdominal wall, right lower 04/15/2016 04/15/2016 quadrant without penetration into peritoneal cavity, subsequent encounter Resolved Problems Electronic Signature(s) ELON, LOMELI ([id number]) Signed: 07/08/2016 4:28:51 PM By: Baltazar Najjar MD Entered By: Baltazar Najjar on 07/08/2016 12:08:37 [name] ([id number]) -------------------------------------------------------------------------------- Progress Note Details Patient Name: [name] Date of Service: 07/08/2016 8:45 AM Medical Record Patient Account Number: [id number] Number: Treating RN: Huel Coventry Apr 24, 1934 (82 y.o. Other Clinician: Date of Birth/Sex: Female) Treating Audriella Blakeley Primary Care Provider: [name] Provider/Extender: G Referring Provider: Charolotte Capuchin in Treatment: 29 Subjective Chief Complaint Information obtained from Patient Ms. Nolley presents today for follow-up evaluation of her diabetic foot ulcers and abdominal wound. History of  Present Illness (HPI) The following HPI elements were documented for the patient's wound: Location: right posterior heel, right Achilles, right lower quadrant abdomen, right fourth toe amp site Quality: denies pain to any wound Severity: not applicable Timing: denies pain 83 year old patient who is known to be diabetic, was referred to [place] by Dr. Gavin Potters for a right heel ulceration which she's had for a while. She was recently in hospital for a pneumonia and at that time and got delirious and was disoriented and sometime during this time developed a stage II ulcer on her right heel. Her past medical history is significant for bilateral pneumonia which was treated with injectable antibiotics and then to oral Levaquin which he has completed. She also has acute on chronic diastolic CHF, acute on chronic respiratory failure, end-stage renal disease on hemodialysis, atrial fibrillation, recent stroke, diabetes mellitus. The patient and her son are poor historians but from what I understand she was admitted to the hospital with an acute vascular compromise of her right lower extremity and Dr. Wyn Quaker has done a surgical procedure and we are trying to obtain these notes. There are also some vascular workup done and we will try and obtain these notes. the injury to the left lower quadrant of abdomen and the suprapubic area have been there due to a bruise and have been there for several months and no intervention has been done. 10/11/2015 -- on review of the [facility] records it was noted that the patient was admitted to the hospital on 09/14/2015 with peripheral vascular disease with  claudication, end-stage renal disease, pressure ulcer, chronic atrial fibrillation. She was seen by Dr. Wyn Quaker who did her right lower extremity angiogram , angioplasty of the right anterior tibial artery and thrombolysis with TPA of the right popliteal artery, and thrombectomy. She was seen by Dr. Wyn Quaker during this past week and  he was pleased with the progress. He did say that if he took her to the operating room for any procedure he would debride the abdominal wound under anesthesia. She was also seen by Dr. Ether Griffins the podiatrist who thought that she may lose her right fourth toe at some stage may need an amputation of this. CHRISIE, JANKOVICH ([id number]) 10/21/2015 --patient known to Dr. Wyn Quaker and his last office visit from 10/04/2015 has been reviewed. 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 She also had a persistent ulceration of the right heel and markedly this area and a right third and fourth toe and a small scab on the calf but these are dry and seemed to be improving. Patient also has a left carotid endarterectomy and multiple interventions to a right brachiocephalic AV fistula. After the visit he had recommended noninvasive studies to recheck her revascularization. He was off the impression that she would likely lose the right fourth toe and the third toe was likely to heal. He was concerned about underlying muscle necrosis on her right heel and midfoot. 11/01/2015 -- an echo done in January of this year showed her left ventricular ejection fraction to be about 50-55%. The patient was seen by the PA and Dr. Driscilla Grammes office and the plan was to take her to the operating room soon to have a debridement under anesthesia for the abdominal wall wound, the Achilles tendon on the right leg and amputation of the right fourth toe. The daughter and the patient do not feel that they would be able to undergo hyperbaric oxygen therapy 5 days a week for 6 weeks. 12/17/15; this is a medically complex woman who I note was recently in this clinic however I was not involved with her care. She returns today with multiple wounds; a) she has a wound in the mid abdomen that is been there since March of this year. I  note that she is been to the overall for debridement recently. The exact etiology of this wound is not really clear b) left lower quadrant abdominal wound had some sanguinous drainage when she came in here. The patient fell in January and thinks this may have been secondary to a hematoma. c): The patient has 3 wounds on her right leg including a small wound on the right mid calf, a large area over the Achilles which currently has a wound VAC for the last 6 weeks, also a smaller wound on the distal part of the right heel. As far as I understand most of these wounds are currently been dressed with's calcium alginate. According to her daughter the Achilles wound under the wound VAC is doing well d) the patient is had an amputation of her left fifth toe in January and the right fourth toe 6 weeks ago secondary to diabetic PAD e) the patient has chronic renal failure on dialysis for the last 2 years secondary to type 2 diabetes on insulin. The daughter's knowledge there is been no biopsy of the abdominal wounds given their current appearance and lack of undefined etiology at have to wonder about calciphylaxis. 12/18/15:Addendum;  I [facility] reviewed [facility] I can see no relevant x-rays of the right heel. I note her arteriogram and revascularization of her right lower extremity in April 2017. She had debridement of both abdominal wounds and the right heel and Achilles wound on 11/07/15 at which time she had a right fourth toe ray amputation. The abdominal wounds were debridement again on 6/29. I do not see any relevant pathology of these abdominal wounds 12/24/15; culture I did of the drainage from the midline abdominal wound last week showed both Proteus and ampicillin sensitive enterococcus. I've given her a course of Augmentin adjusted on dialysis days. She has no specific complaints today. Been using Santyl to the abdominal wounds in the right leg wound and the wound VAC on the right Achilles  which was initially prescribed by Dr. dew 12/31/15; I have done two punch biopsies of the large midline abdominal. 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 May have been a trauma component of the one on the left lower quadrant however the midline wound had no such history. She has a large area on the right Achilles heel with a wound VAC prescribed by Dr. dew. A small wound on the right anterior leg.Marland Kitchen UNFORTUNATELY she has 2 new wounds today. One on the left heel which is probably a pressure area. As well her previous amputation site of her right fourth toe has dehisced and now has a small wound with significant depth at the amputation site. 01/14/2016 -- she returns after 2 weeks and had had a punch biopsy of abdominal wound done the last visit DELAINE, HERNANDEZ ([id number]) -- had a biopsy of her midline abdominal wound done and the Pathology diagnosis is that of ulceration, necrosis and inflammation and negative for dysplasia and malignancy. 01/21/16. 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 In the meantime she has new wounds of the left heel the right fourth toe amputation site is opened up. The back is stopped to the right heel area. 01/28/16; the abdominal wounds continued to improve. The extensive wound on her right Achilles also looks stable except for the lower aspect of the wound where there is a large liquefied area that probes right down to her calcaneus. This cultured Proteus last week I have her on Augmentin and doxycycline 1. I think this is going to need a course of IV antibiotics and I will call dialysis. X-ray I did last week was negative, I think she is going to need an MRI 02/04/16; right heel MRI as before Saturday. Receiving I believe IV Rocephin at dialysis 02/11/16; as it turns out the patient could not have a MRI as she has a bladder stimulator in place even though it is not currently in use since the beginning of  this year. Although she has an allergy to IV contrast she apparently has done well with premedication so we will have to go for a CT scan with contrast. In the meantime she has had a fall now has a large skin tear on her left upper arm. She went to the ER and they suggested Tegaderm over topical antibiotics 03/03/16 currently patient returns after having been hospitalized for 2 weeks and subsequently transferred to [facility] nursing facility. She actually seems to be doing excellent compared to even when we last saw her according to our nursing staff. Both patient and her daughter are extremely encouraged at how well she is presenting at this point in time. Overall the biggest  issue is still the right Achilles area which is being managed at this point in time by Dr. Wyn Quaker. Patient is currently utilizing a wound VAC to that region. 03/17/16; patient is at [facility] nursing home still. Using Aquacel Ag to the wounds on her bilateral feet and still Prisma to the small open area on her abdomen. 03/31/16 at this point in time patient has been tolerating the dressing changes currently. She fortunately has no worsening of her symptoms although she tells me that the nurse who is caring for her at [facility] nursing facility decided that nothing was needed in regard to the lower abdominal wound from a dressing standpoint at this time. she is really not having significant discomfort or pain at this point she continues to have some tunneling in the proximal Achilles wound region. 04/07/16 patient continues to do well on evaluation today. Even the Achilles wound which has been the most tender is not giving her as much trouble. Unfortunately the PolyMem dressings that we utilize last week really did not seem to benefit her in particular. Obviously we will discontinue that at this point in time today. 04-15-16:Ms. Koranda is accompanied today by her daughter. She is still residing in an SNF undergoing dialysis,  continues to receive antibiotics during dialysis as prescribed by Dr. Sampson Goon of infectious disease. She has a follow-up appointment with Dr. Sampson Goon on 04-17-16 to discuss the continuation of these antibiotics. Ms. Peake denies any pain to any of the 3 remaining wounds she does admit to changing to [employer] surgical shoes for safety while ambulating with physical therapy. She denies any falls since her last appointment here although she and her daughter do admit to increased tremors of unclear etiology since her last appointment. She has tolerated the dressing changes that were prescribed last week. 04-22-16 Ms. Benavides presents today accompanied by her daughter for evaluation of her diabetic foot ulcers. She is still residing in an SNF, she continues dialysis 3 times weekly. She'll follow up with Dr. Sampson Goon last Friday, and at that appointment IV antibiotics were discontinued. According to Ms. Mcmurtrey and her daughter if there is any regression of her wounds Dr. Sampson Goon will consider re-starting the antibiotics. Ms. Rowles daughter states that since the discontinuation of antibiotic therapy her "twitches" have resolved. 04/29/16; I have not seen this patient in quite some time however she is completed triple antibiotic therapy given at dialysis for osteomyelitis as prescribed by infectious disease. She is still being followed by Dr. Wyn Quaker [name] ([id number]) of vascular surgery. We are using Hydrofera Blue to the surface of these wounds. She currently has 2 open areas a substantial area over her Achilles area although this is a lot better than the last time I saw this. She also has a small wound superiorly from this wound which has a superior probing depth of 2 cm. 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 My biopsy did not prove this nevertheless I think this is the correct clinical diagnosis. We are now following her for an area on the right Achilles part of her heel. This is not any different from last week. She also has a small tunneling area just above this. And unfortunately this week there is been a reopening of an area where her right fourth toe was  previously amputated. She is completed antibiotics 05/13/16; she has a new reopening on the mid abdominal wound in the same site is previously. This is a small open area. Apligraf #1 today to the areas on the right heel o2 still an open area in the fourth toe amputation site 05/27/16; the areas on her abdomen are completely closed over. Small open area from last time is closed. Apligraf #2 today to the areas on the right heel. The 4th toe amputation site is also heel 06/10/16; we did not have an Apligraf to put on today. This appeared tunneling wound on the right posterior calf appears to be closed. The more substantial area on her Achilles itself is improved. 06/24/16; I reapplied her third Apligraf today although we have not had one debridement last time. The original tunneling wound is not as closed as last time. The more substantial area on her Achilles itself is improved with advancing epithelialization 07/18/16 Apligraf #4 today. The original long area over her Achilles as a healthy-looking area at the superior aspect and a small divot inferiorly. Both of the wound surface is look healthy Objective Constitutional Sitting or standing Blood Pressure is within target range for patient.. Pulse regular and within target range for patient.Marland Kitchen Respirations regular, non-labored and within target range.. Temperature is normal and within the target range for the patient.. Patient's appearance is neat and clean. Appears in no acute distress. Well nourished and well developed.. Vitals Time Taken:  9:02 AM, Height: 65 in, Weight: 160 lbs, BMI: 26.6, Temperature: 97.7 F, Pulse: 76 bpm, Respiratory Rate: 16 breaths/min, Blood Pressure: 116/55 mmHg. Cardiovascular Pedal pulses palpable and strong bilaterally.. General Notes: Wound exam; the area on her Achilles continues to improve over the small divot inferiorly still has depth. No debridement was required. Apligraf #4 applied. The small area superiorly that was at tunneling area remains closed. Integumentary (Hair, Skin) Wound #9 status is Open. Original cause of wound was Gradually Appeared. The wound is located on the [place]. ([id number]) Right Achilles. The wound measures 2.4cm length x 1.2cm width x 0.2cm depth; 2.262cm^2 area and 0.452cm^3 volume. There is no tunneling or undermining noted. There is a large amount of serosanguineous drainage noted. The wound margin is distinct with the outline attached to the wound base. There is large (67-100%) red, pink granulation within the wound bed. There is a small (1-33%) amount of necrotic tissue within the wound bed including Adherent Slough. Periwound temperature was noted as No Abnormality. The periwound has tenderness on palpation. Assessment Active Problems ICD-10 E11.621 - Type 2 diabetes mellitus with foot ulcer L97.512 - Non-pressure chronic ulcer of other part of right foot with fat layer exposed E11.51 - Type 2 diabetes mellitus with diabetic peripheral angiopathy without gangrene Procedures Wound #9 Wound #9 is a Diabetic Wound/Ulcer of the Lower Extremity located on the Right Achilles. A skin graft procedure using a bioengineered skin substitute/cellular or tissue based product was performed by Maxwell Caul, MD. Apligraf was applied and secured with Steri-Strips. 3 sq cm of product was utilized and 41 sq cm was wasted due to product size. Post Application, mepitel was applied. A Time Out was conducted at 09:22, prior to the start of the procedure. The  procedure was tolerated well with a pain level of 0 throughout and a pain level of 0 following the procedure. Post procedure Diagnosis Wound #9: Same as Pre-Procedure . Plan Wound Cleansing: Wound #9 Right Achilles: Clean wound with Normal Saline. Anesthetic: Wound #9 Right  Achilles: Topical Lidocaine 4% cream applied to wound bed prior to debridement - all wounds for clinic use only Primary Wound Dressing: RUT, BETTERTON ([id number]) Wound #9 Right Achilles: Other: - Apligraf Secondary Dressing: Wound #9 Right Achilles: XtraSorb Other - charcoal Dressing Change Frequency: Wound #9 Right Achilles: Three times weekly - HHRN to change only the outside dressing do not take off the steri-strips or the mepitel. Pt has a skin sub/graft on. Follow-up Appointments: Wound #9 Right Achilles: Return Appointment in 1 week. - nurse visit Return Appointment in 2 weeks. - MD visit Edema Control: Wound #9 Right Achilles: Kerlix and Coban - Right Lower Extremity Elevate legs to the level of the heart and pump ankles as often as possible Off-Loading: Wound #9 Right Achilles: Other: - Float heels Advanced Therapies: Wound #9 Right Achilles: Apligraf application in clinic; including contact layer, fixation with steri strips, dry gauze and cover dressing. 1 appligraf number 4 making good progess Electronic Signature(s) Signed: 07/08/2016 4:28:51 PM By: Baltazar Najjar MD Entered By: Baltazar Najjar on 07/08/2016 12:16:12 [name] ([id number]) -------------------------------------------------------------------------------- SuperBill Details Patient Name: [name] Date of Service: 07/08/2016 Medical Record Patient Account Number: [id number] Number: Treating RN: Huel Coventry Apr 09, 1934 (82 y.o. Other Clinician: Date of Birth/Sex: Female) Treating Devion Chriscoe Primary Care Provider: [name] Provider/Extender: G Referring Provider: Charolotte Capuchin in  Treatment: 29 Diagnosis Coding ICD-10 Codes Code Description E11.621 Type 2 diabetes mellitus with foot ulcer L97.512 Non-pressure chronic ulcer of other part of right foot with fat layer exposed E11.51 Type 2 diabetes mellitus with diabetic peripheral angiopathy without gangrene Facility Procedures CPT4: Description Modifier Quantity Code [id number] [phone] (Facility Use Only) Apligraf 44 SQ CM 44 CPT4: [id number] - SKIN SUB GRAFT TRNK/ARM/LEG 1 ICD-10 Description Diagnosis E11.621 Type 2 diabetes mellitus with foot ulcer L97.512 Non-pressure chronic ulcer of other part of right foot with fat layer exposed E11.51 Type 2 diabetes mellitus with  diabetic peripheral angiopathy without gangrene Physician Procedures CPT4: Description Modifier Quantity Code [id number] - WC PHYS SKIN SUB GRAFT TRNK/ARM/LEG 1 ICD-10 Description Diagnosis E11.621 Type 2 diabetes mellitus with foot ulcer L97.512 Non-pressure chronic ulcer of other part of right foot with fat layer  exposed E11.51 Type 2 diabetes mellitus with diabetic peripheral angiopathy without gangrene Electronic Signature(s) Signed: 07/08/2016 4:28:51 PM By: Baltazar Najjar MD Entered By: Baltazar Najjar on 07/08/2016 12:16:50 [name] ([id number])

## 2016-07-09 NOTE — Progress Notes (Signed)
DAHNA, HATTABAUGH ([id number]) Visit Report for 07/08/2016 Arrival Information Details Patient Name: [name], COLOMBE. Date of Service: 07/08/2016 8:45 AM Medical Record Patient Account Number: [id number] Number: Treating RN: Huel Coventry [birth year] (82 y.o. Other Clinician: Date of Birth/Sex: Female) Treating ROBSON, MICHAEL Primary Care Lilli Dewald: [name] Marlaine Arey/Extender: G Referring Tirza Senteno: Charolotte Capuchin in Treatment: 29 Visit Information History Since Last Visit Added or deleted any medications: No Patient Arrived: Wheel Chair Any new allergies or adverse reactions: No Arrival Time: 08:57 Had a fall or experienced change in No Accompanied By: daughter activities of daily living that may affect Transfer Assistance: Manual risk of falls: Patient Identification Verified: Yes Signs or symptoms of abuse/neglect since last No Secondary Verification Process Yes visito Completed: Hospitalized since last visit: No Patient Requires Transmission- No Has Dressing in Place as Prescribed: Yes Based Precautions: Pain Present Now: No Patient Has Alerts: Yes Patient Alerts: Patient on Blood Thinner DMII Warfarin ABI [place] Bilateral NO BP RIGHT ARM Electronic Signature(s) Signed: 07/08/2016 5:04:45 PM By: Elliot Gurney, RN, BSN, Kim RN, BSN Entered By: Elliot Gurney, RN, BSN, Kim on 07/08/2016 09:00:53 [name] ([id number]) -------------------------------------------------------------------------------- Encounter Discharge Information Details Patient Name: KILAH, DRAHOS. Date of Service: 07/08/2016 8:45 AM Medical Record Patient Account Number: [id number] Number: Treating RN: Phillis Haggis July 31, 1933 (82 y.o. Other Clinician: Date of Birth/Sex: Female) Treating ROBSON, MICHAEL Primary Care Coty Larsh: [name] Michaline Kindig/Extender: G Referring Quaneisha Hanisch: Charolotte Capuchin in Treatment: 29 Encounter Discharge Information Items Discharge Pain Level:  0 Discharge Condition: Stable Ambulatory Status: Wheelchair Discharge Destination: Home Transportation: Private Auto Accompanied By: daughter Schedule Follow-up Appointment: Yes Medication Reconciliation completed Yes and provided to Patient/Care Natale Thoma: Provided on Clinical Summary of Care: 07/08/2016 Form Type Recipient Paper Patient [facility] Electronic Signature(s) Signed: 07/08/2016 5:04:45 PM By: Elliot Gurney RN, BSN, Kim RN, BSN Previous Signature: 07/08/2016 9:38:25 AM Version By: Gwenlyn Perking Entered By: Elliot Gurney RN, BSN, Kim on 07/08/2016 09:39:27 [name] ([id number]) -------------------------------------------------------------------------------- Lower Extremity Assessment Details Patient Name: [name]. Date of Service: 07/08/2016 8:45 AM Medical Record Patient Account Number: [id number] Number: Treating RN: Huel Coventry Feb 14, 1934 (82 y.o. Other Clinician: Date of Birth/Sex: Female) Treating ROBSON, MICHAEL Primary Care Aidaly Cordner: [name] Wenda Vanschaick/Extender: G Referring Ryoma Nofziger: Charolotte Capuchin in Treatment: 29 Vascular Assessment Pulses: Dorsalis Pedis Palpable: [Right:Yes] Posterior Tibial Extremity colors, hair growth, and conditions: Extremity Color: [Right:Hyperpigmented] Hair Growth on Extremity: [Right:No] Temperature of Extremity: [Right:Warm] Capillary Refill: [Right:> 3 seconds] Dependent Rubor: [Right:No] Blanched when Elevated: [Right:No] Lipodermatosclerosis: [Right:No] Toe Nail Assessment Left: Right: Thick: No Discolored: Yes Deformed: No Improper Length and Hygiene: No Electronic Signature(s) Signed: 07/08/2016 5:04:45 PM By: Elliot Gurney, RN, BSN, Kim RN, BSN Entered By: Elliot Gurney, RN, BSN, Kim on 07/08/2016 09:13:54 [name] ([id number]) -------------------------------------------------------------------------------- Multi Wound Chart Details Patient Name: [name] Date of Service: 07/08/2016 8:45 AM Medical  Record Patient Account Number: [id number] Number: Treating RN: Huel Coventry September 01, 1933 (82 y.o. 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 ([id number]) Exudate Color: red, brown N/A N/A Wound Margin: Distinct, outline attached N/A N/A Granulation Amount: Large (67-100%) N/A N/A Granulation Quality: Red, Pink N/A N/A Necrotic Amount: Small (1-33%) N/A N/A Epithelialization: None N/A N/A Periwound Skin Texture: No Abnormalities Noted N/A N/A Periwound Skin No Abnormalities Noted N/A N/A Moisture: Periwound Skin Color: No Abnormalities Noted N/A N/A Temperature: No Abnormality N/A N/A Tenderness on Yes N/A N/A Palpation: Wound Preparation: Ulcer Cleansing: N/A N/A Rinsed/Irrigated with Saline, Other: soap and water Topical Anesthetic Applied: Other: lidocaine 4% Procedures Performed: Cellular or  Tissue Based N/A N/A Product Treatment Notes Wound #9 (Right Achilles) 1. Cleansed with: Clean wound with Normal Saline 2. Anesthetic Topical Lidocaine 4% cream to wound bed prior to debridement 4. Dressing Applied: Mepitel 5. Secondary Dressing Applied Kerlix/Conform Notes kerlix, coban, xtrasorb, charcoal, apligraf Electronic Signature(s) Signed: 07/08/2016 4:28:51 PM By: Baltazar Najjarobson, Michael MD Entered By: Baltazar Najjarobson, Michael on 07/08/2016 12:08:50 Natalie RimaHOPKINS, Graceanne Y. ([id number]) -------------------------------------------------------------------------------- Multi-Disciplinary Care Plan Details Patient Name: Natalie RimaHOPKINS, Lateka Y. Date of Service: 07/08/2016 8:45 AM Medical Record Patient Account Number: [id number] Number: Treating RN: Huel CoventryWoody, Kim [birth year] (82 y.o. Other Clinician: Date of Birth/Sex: Female) Treating ROBSON, MICHAEL Primary Care Jamel Dunton: Rolm GalaGrandis, Heidi Leopoldo Mazzie/Extender: G Referring Traivon Morrical: Charolotte CapuchinGrandis, Heidi Weeks in Treatment: 29 Active Inactive ` Abuse / Safety / Falls / Self Care Management Nursing Diagnoses: Impaired physical mobility Potential for falls Goals: Patient will remain injury free Date Initiated: 12/17/2015 Target Resolution Date: 08/29/2016 Goal Status: Active Interventions: Assess fall risk on admission and as needed Notes: ` Necrotic Tissue Nursing Diagnoses: Impaired tissue integrity related to necrotic/devitalized tissue Knowledge deficit related to management of necrotic/devitalized tissue Goals: Necrotic/devitalized tissue will be minimized in the wound bed Date Initiated: 02/04/2016 Target Resolution Date: 08/29/2016 Goal Status: Active Interventions: Assess patient pain level pre-, during and post procedure and prior to discharge Provide education on necrotic tissue and debridement process Treatment Activities: Apply topical anesthetic as ordered : 02/04/2016 Natalie RimaHOPKINS, Sharmaine Y. ([id number]) Notes: ` Wound/Skin  Impairment Nursing Diagnoses: Impaired tissue integrity Goals: Patient/caregiver will verbalize understanding of skin care regimen Date Initiated: 12/17/2015 Target Resolution Date: 08/29/2016 Goal Status: Active Ulcer/skin breakdown will have a volume reduction of 30% by week 4 Date Initiated: 12/17/2015 Target Resolution Date: 08/29/2016 Goal Status: Active Ulcer/skin breakdown will have a volume reduction of 50% by week 8 Date Initiated: 12/17/2015 Target Resolution Date: 08/29/2016 Goal Status: Active Ulcer/skin breakdown will have a volume reduction of 80% by week 12 Date Initiated: 12/17/2015 Target Resolution Date: 08/29/2016 Goal Status: Active Ulcer/skin breakdown will heal within 14 weeks Date Initiated: 12/17/2015 Target Resolution Date: 08/29/2016 Goal Status: Active Interventions: Assess patient/caregiver ability to obtain necessary supplies Assess patient/caregiver ability to perform ulcer/skin care regimen upon admission and as needed Assess ulceration(s) every visit Notes: Electronic Signature(s) Signed: 07/08/2016 5:04:45 PM By: Elliot GurneyWoody, RN, BSN, Kim RN, BSN Entered By: Elliot GurneyWoody, RN, BSN, Kim on 07/08/2016 09:14:02 Natalie RimaHOPKINS, Cipriana Y. ([id number]) -------------------------------------------------------------------------------- Pain Assessment Details Patient Name: Natalie RimaHOPKINS, Marissah Y. Date of Service: 07/08/2016 8:45 AM Medical Record Patient Account Number: [id number] Number: Treating RN: Elliot GurneyWoody,  Kim 12-01-1933 (82 y.o. Other Clinician: Date of Birth/Sex: Female) Treating ROBSON, MICHAEL Primary Care Myla Mauriello: [name] Kathryne Ramella/Extender: G Referring Tarris Delbene: Charolotte Capuchin in Treatment: 29 Active Problems Location of Pain Severity and Description of Pain Patient Has Paino No Site Locations With Dressing Change: No Pain Management and Medication Current Pain Management: Electronic Signature(s) Signed: 07/08/2016 5:04:45 PM By: Elliot Gurney, RN, BSN, Kim RN,  BSN Entered By: Elliot Gurney, RN, BSN, Kim on 07/08/2016 09:01:00 [name] ([id number]) -------------------------------------------------------------------------------- Patient/Caregiver Education Details Patient Name: [name] Date of Service: 07/08/2016 8:45 AM Medical Record Patient Account Number: [id number] Number: Treating RN: Huel Coventry 1934-01-25 (82 y.o. Other Clinician: Date of Birth/Gender: Female) Treating ROBSON, MICHAEL Primary Care Physician: [name] Physician/Extender: G Referring Physician: Charolotte Capuchin in Treatment: 46 Education Assessment Education Provided To: Patient Education Topics Provided Wound Debridement: Wound/Skin Impairment: Handouts: Caring for Your Ulcer, Other: continue wound care as prescribed Methods: Demonstration, Explain/Verbal Responses: State content correctly Electronic Signature(s) Signed: 07/08/2016 5:04:45 PM By: Elliot Gurney, RN, BSN, Kim RN, BSN Entered By: Elliot Gurney, RN, BSN, Kim on 07/08/2016 09:39:56 [name] ([id number]) -------------------------------------------------------------------------------- Wound Assessment Details Patient Name: [name]. Date of Service: 07/08/2016 8:45 AM Medical Record Patient Account Number: [id number] Number: Treating RN: Huel Coventry [birth year] (82 y.o. Other Clinician: Date of Birth/Sex: Female) Treating ROBSON, MICHAEL Primary Care Jaber Dunlow: [name] Kynnadi Dicenso/Extender: G Referring Oddis Westling: Charolotte Capuchin in Treatment: 29 Wound Status Wound Number: 9 Primary Diabetic Wound/Ulcer of the Lower Etiology: Extremity Wound Location: Right Achilles Secondary Arterial Insufficiency Ulcer Wounding Event: Gradually Appeared Etiology: Date Acquired: 06/02/2015 Wound Open Weeks Of Treatment: 29 Status: Clustered Wound: No Comorbid Arrhythmia, Congestive Heart Pending Amputation On Presentation History: Failure, Hypertension, Type  II Diabetes Photos Wound Measurements Length: (cm) 2.4 Width: (cm) 1.2 Depth: (cm) 0.2 Area: (cm) 2.262 Volume: (cm) 0.452 % Reduction in Area: 93.1% % Reduction in Volume: 95.4% Epithelialization: None Tunneling: No Undermining: No Wound Description Classification: Grade 2 Foul Odor Aft Wound Margin: Distinct, outline attached Exudate Amount: Large Exudate Type: Serosanguineous Exudate Color: red, brown er Cleansing: No Wound Bed Granulation Amount: Large (67-100%) Granulation Quality: Red, Pink Necrotic Amount: Small (1-33%) Viger, Vinetta Bergamo ([id number]) Necrotic Quality: Adherent Slough Periwound Skin Texture Texture Color No Abnormalities Noted: No No Abnormalities Noted: No Moisture Temperature / Pain No Abnormalities Noted: No Temperature: No Abnormality Tenderness on Palpation: Yes Wound Preparation Ulcer Cleansing: Rinsed/Irrigated with Saline, Other: soap and water, Topical Anesthetic Applied: Other: lidocaine 4%, Treatment Notes Wound #9 (Right Achilles) 1. Cleansed with: Clean wound with Normal Saline 2. Anesthetic Topical Lidocaine 4% cream to wound bed prior to debridement 4. Dressing Applied: Mepitel 5. Secondary Dressing Applied Kerlix/Conform Notes kerlix, coban, xtrasorb, charcoal, Designer, multimedia) Signed: 07/08/2016 5:04:45 PM By: Elliot Gurney, RN, BSN, Kim RN, BSN Entered By: Elliot Gurney, RN, BSN, Kim on 07/08/2016 09:12:43 [name] ([id number]) -------------------------------------------------------------------------------- Vitals Details Patient Name: [name] Date of Service: 07/08/2016 8:45 AM Medical Record Patient Account Number: [id number] Number: Treating RN: Huel Coventry 1934-03-08 (82 y.o. Other Clinician: Date of Birth/Sex: Female) Treating ROBSON, MICHAEL Primary Care Fina Heizer: [name] Caz Weaver/Extender: G Referring Jerral Mccauley: Charolotte Capuchin in Treatment: 29 Vital Signs Time  Taken: 09:02 Temperature (F): 97.7 Height (in): 65 Pulse (bpm): 76 Weight (lbs): 160 Respiratory Rate (breaths/min): 16 Body Mass Index (BMI): 26.6 Blood Pressure (mmHg): 116/55 Reference Range: 80 - 120 mg / dl Electronic Signature(s) Signed: 07/08/2016 5:04:45 PM By: Elliot Gurney, RN, BSN, Kim RN, BSN  Entered By: Elliot Gurney, RN, BSN, Kim on 07/08/2016 09:03:19

## 2016-07-15 ENCOUNTER — Ambulatory Visit: Payer: Medicare Other | Admitting: Internal Medicine

## 2016-07-22 ENCOUNTER — Encounter: Payer: Medicare Other | Admitting: Internal Medicine

## 2016-07-22 DIAGNOSIS — E11621 Type 2 diabetes mellitus with foot ulcer: Secondary | ICD-10-CM | POA: Diagnosis not present

## 2016-07-23 NOTE — Progress Notes (Signed)
[name], Natalie Y. ([id number]) Visit Report for 07/22/2016 Arrival Information Details Patient Name: [name], Natalie Y. Date of Service: 07/22/2016 3:30 PM Medical Record Patient Account Number: [id number] Number: Treating RN: Phillis Haggisinkerton, Debi Mar 07, 1934 (82 [name] Other Clinician: Date of Birth/Sex: Female) Treating ROBSON, MICHAEL Primary Care Jonnathan Birman: Rolm GalaGrandis, Heidi Jeselle Hiser/Extender: G Referring Ivin Rosenbloom: Charolotte CapuchinGrandis, Heidi Weeks in Treatment: 31 Visit Information History Since Last Visit All ordered tests and consults were completed: No Patient Arrived: Wheel Chair Added or deleted any medications: No Arrival Time: 15:36 Any new allergies or adverse reactions: No Accompanied By: self Had a fall or experienced change in No Transfer Assistance: EasyPivot Patient activities of daily living that may affect Lift risk of falls: Patient Identification Verified: Yes Signs or symptoms of abuse/neglect since last No Secondary Verification Process Yes visito Completed: Hospitalized since last visit: No Patient Requires Transmission- No Has Dressing in Place as Prescribed: Yes Based Precautions: Pain Present Now: No Patient Has Alerts: Yes Patient Alerts: Patient on Blood Thinner DMII Warfarin ABI [place] Bilateral NO BP RIGHT ARM Electronic Signature(s) Signed: 07/22/2016 5:13:02 PM By: Alejandro MullingPinkerton, Debra Entered By: Alejandro MullingPinkerton, Debra on 07/22/2016 15:36:58 [name], Natalie Y. ([id number]) -------------------------------------------------------------------------------- Clinic Level of Care Assessment Details Patient Name: [name], Natalie Y. Date of Service: 07/22/2016 3:30 PM Medical Record Patient Account Number: [id number] Number: Treating RN: Phillis Haggisinkerton, Debi Mar 07, 1934 (82 [name] Other Clinician: Date of Birth/Sex: Female) Treating ROBSON, MICHAEL Primary Care Mahiya Kercheval: Rolm GalaGrandis, Heidi Domnique Vantine/Extender: G Referring Cherina Dhillon: Charolotte CapuchinGrandis, Heidi Weeks in Treatment:  31 Clinic Level of Care Assessment Items TOOL 4 Quantity Score X - Use when only an EandM is performed on FOLLOW-UP visit 1 0 ASSESSMENTS - Nursing Assessment / Reassessment X - Reassessment of Co-morbidities (includes updates in patient status) 1 10 X - Reassessment of Adherence to Treatment Plan 1 5 ASSESSMENTS - Wound and Skin Assessment / Reassessment []  - Simple Wound Assessment / Reassessment - one wound 0 X - Complex Wound Assessment / Reassessment - multiple wounds 1 5 []  - Dermatologic / Skin Assessment (not related to wound area) 0 ASSESSMENTS - Focused Assessment []  - Circumferential Edema Measurements - multi extremities 0 []  - Nutritional Assessment / Counseling / Intervention 0 []  - Lower Extremity Assessment (monofilament, tuning fork, pulses) 0 []  - Peripheral Arterial Disease Assessment (using hand held doppler) 0 ASSESSMENTS - Ostomy and/or Continence Assessment and Care []  - Incontinence Assessment and Management 0 []  - Ostomy Care Assessment and Management (repouching, etc.) 0 PROCESS - Coordination of Care []  - Simple Patient / Family Education for ongoing care 0 X - Complex (extensive) Patient / Family Education for ongoing care 1 20 X - Staff obtains ChiropractorConsents, Records, Test Results / Process Orders 1 10 X - Staff telephones HHA, Nursing Homes / Clarify orders / etc 1 10 [name], Natalie BergamoJOANN Y. ([id number]) []  - Routine Transfer to another Facility (non-emergent condition) 0 []  - Routine Hospital Admission (non-emergent condition) 0 []  - New Admissions / Manufacturing engineernsurance Authorizations / Ordering NPWT, Apligraf, etc. 0 []  - Emergency Hospital Admission (emergent condition) 0 X - Simple Discharge Coordination 1 10 []  - Complex (extensive) Discharge Coordination 0 PROCESS - Special Needs []  - Pediatric / Minor Patient Management 0 []  - Isolation Patient Management 0 []  - Hearing / Language / Visual special needs 0 []  - Assessment of Community assistance (transportation, D/C  planning, etc.) 0 []  - Additional assistance / Altered mentation 0 []  - Support Surface(s) Assessment (bed, cushion, seat, etc.) 0 INTERVENTIONS - Wound Cleansing / Measurement []  -  Simple Wound Cleansing - one wound 0 X - Complex Wound Cleansing - multiple wounds 2 5 X - Wound Imaging (photographs - any number of wounds) 1 5 []  - Wound Tracing (instead of photographs) 0 []  - Simple Wound Measurement - one wound 0 X - Complex Wound Measurement - multiple wounds 2 5 INTERVENTIONS - Wound Dressings []  - Small Wound Dressing one or multiple wounds 0 X - Medium Wound Dressing one or multiple wounds 1 15 []  - Large Wound Dressing one or multiple wounds 0 X - Application of Medications - topical 1 5 []  - Application of Medications - injection 0 [name], WOODROW ([id number]) INTERVENTIONS - Miscellaneous []  - External ear exam 0 []  - Specimen Collection (cultures, biopsies, blood, body fluids, etc.) 0 []  - Specimen(s) / Culture(s) sent or taken to Lab for analysis 0 []  - Patient Transfer (multiple staff / Michiel Sites Lift / Similar devices) 0 []  - Simple Staple / Suture removal (25 or less) 0 []  - Complex Staple / Suture removal (26 or more) 0 []  - Hypo / Hyperglycemic Management (close monitor of Blood Glucose) 0 []  - Ankle / Brachial Index (ABI) - do not check if billed separately 0 X - Vital Signs 1 5 Has the patient been seen at the hospital within the last three years: Yes Total Score: 120 Level Of Care: New/Established - Level 4 Electronic Signature(s) Signed: 07/22/2016 5:13:02 PM By: Alejandro Mulling Entered By: Alejandro Mulling on 07/22/2016 17:04:10 [name] ([id number]) -------------------------------------------------------------------------------- Encounter Discharge Information Details Patient Name: [name] Date of Service: 07/22/2016 3:30 PM Medical Record Patient Account Number: [id number] Number: Treating RN: Phillis Haggis Nov 24, 1933 (82 [name]  Other Clinician: Date of Birth/Sex: Female) Treating ROBSON, MICHAEL Primary Care Esa Raden: [name] Ryszard Socarras/Extender: G Referring Katheren Jimmerson: Charolotte Capuchin in Treatment: 71 Encounter Discharge Information Items Discharge Pain Level: 0 Discharge Condition: Stable Ambulatory Status: Wheelchair Discharge Destination: Home Transportation: Private Auto Accompanied By: daughter Schedule Follow-up Appointment: Yes Medication Reconciliation completed and provided to Patient/Care No Haylynn Pha: Provided on Clinical Summary of Care: 07/22/2016 Form Type Recipient Paper Patient [facility] Electronic Signature(s) Signed: 07/22/2016 4:18:39 PM By: Gwenlyn Perking Entered By: Gwenlyn Perking on 07/22/2016 16:18:39 [name] ([id number]) -------------------------------------------------------------------------------- Lower Extremity Assessment Details Patient Name: [name] Date of Service: 07/22/2016 3:30 PM Medical Record Patient Account Number: [id number] Number: Treating RN: Phillis Haggis 1933/07/04 (82 [name] Other Clinician: Date of Birth/Sex: Female) Treating ROBSON, MICHAEL Primary Care Rhya Shan: [name] Amorina Doerr/Extender: G Referring Lilton Pare: Charolotte Capuchin in Treatment: 31 Vascular Assessment Pulses: Dorsalis Pedis Palpable: [Right:Yes] Posterior Tibial Extremity colors, hair growth, and conditions: Extremity Color: [Right:Hyperpigmented] Temperature of Extremity: [Right:Warm] Capillary Refill: [Right:< 3 seconds] Electronic Signature(s) Signed: 07/22/2016 5:13:02 PM By: Alejandro Mulling Entered By: Alejandro Mulling on 07/22/2016 15:49:26 [name] ([id number]) -------------------------------------------------------------------------------- Multi Wound Chart Details Patient Name: [name] Date of Service: 07/22/2016 3:30 PM Medical Record Patient Account Number: [id number] Number: Treating RN: Phillis Haggis 11-29-1933 (82 [name] 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] [N/A:Diabetic Wound/Ulcer of N/A the Lower Extremity] Secondary  Etiology: [19:N/A] [N/A:Arterial Insufficiency Ulcer N/A] Comorbid History: [19:Arrhythmia, Congestive Heart Failure, Hypertension, Type II Diabetes] [N/A:Arrhythmia, Congestive N/A Heart Failure, Hypertension, Type II Diabetes] Date Acquired: [19:07/22/2016] [N/A:06/02/2015 N/A] Weeks of Treatment: [19:0] [N/A:31 N/A] Wound Status: [19:Open] [N/A:Open N/A] Pending Amputation on No [N/A:Yes N/A] Presentation: Measurements L x W x D 1x1x0.1 [N/A:2.4x1.2x0.2 N/A] (cm) Area (cm) : [19:0.785] [N/A:2.262 N/A] Volume (cm) : [19:0.079] [N/A:0.452 N/A] % Reduction in Area: [19:N/A] [N/A:93.10% N/A] % Reduction in Volume: N/A [N/A:95.40% N/A] Classification: [19:Grade 1] [N/A:Grade 2 N/A] Exudate Amount: [19:Large] [N/A:Large N/A] Exudate Type: [19:Serous] [N/A:Serosanguineous N/A] Exudate Color: [19:amber] [N/A:red, brown N/A] Wound Margin: [19:Distinct, outline attached] [N/A:Distinct, outline attached N/A] Granulation Amount: [19:Large (67-100%)] [N/A:Large (67-100%) N/A] Granulation Quality: [19:Red] [N/A:Red, Pink N/A] Necrotic Amount: None Present (0%) Small (1-33%) N/A Epithelialization: None None N/A Periwound Skin Texture: No Abnormalities Noted No Abnormalities Noted N/A Periwound Skin  No Abnormalities Noted No Abnormalities Noted N/A Moisture: Periwound Skin Color: No Abnormalities Noted No Abnormalities Noted N/A Temperature: No Abnormality No Abnormality N/A Tenderness on Yes Yes N/A Palpation: Wound Preparation: Ulcer Cleansing: Ulcer Cleansing: N/A Rinsed/Irrigated with Rinsed/Irrigated with Saline Saline, Other: soap and water Topical Anesthetic Applied: None Topical Anesthetic Applied: Other: lidocaine 4% Treatment Notes Wound #19 (Right, Lateral Malleolus) 1. Cleansed with: Clean wound with Normal Saline Cleanse wound with antibacterial soap and water 2. Anesthetic Topical Lidocaine 4% cream to wound bed prior to debridement 4. Dressing Applied: Aquacel Ag 5. Secondary Dressing Applied Dry Gauze Foam 7. Secured with Tape Notes kerlix, coban Wound #9 (Right Achilles) 1. Cleansed with: Clean wound with Normal Saline Cleanse wound with antibacterial soap and water 2. Anesthetic Topical Lidocaine 4% cream to wound bed prior to debridement 4. Dressing Applied: Hydrafera Blue 5. Secondary Dressing Applied ABD Pad Dry Gauze 7. Secured with [name], BRADSTREET ([id number]) Tape Notes Asencion Partridge Electronic Signature(s) Signed: 07/22/2016 4:59:22 PM By: Baltazar Najjar MD Entered By: Baltazar Najjar on 07/22/2016 16:41:33 Denys, Labree [name] ([id number]) -------------------------------------------------------------------------------- Multi-Disciplinary Care Plan Details Patient Name: [name], LAGRANGE. Date of Service: 07/22/2016 3:30 PM Medical Record Patient Account Number: [id number] Number: Treating RN: Phillis Haggis July 20, 1933 (80 [name] Other Clinician: Date of Birth/Sex: Female) Treating ROBSON, MICHAEL Primary Care Josely Moffat: [name] Virdell Hoiland/Extender: G Referring Harles Evetts: Charolotte Capuchin in Treatment: 30 Active Inactive ` Abuse / Safety / Falls / Self Care Management Nursing Diagnoses: Impaired physical  mobility Potential for falls Goals: Patient will remain injury free Date Initiated: 12/17/2015 Target Resolution Date: 08/29/2016 Goal Status: Active Interventions: Assess fall risk on admission and as needed Notes: ` Necrotic Tissue Nursing Diagnoses: Impaired tissue integrity related to necrotic/devitalized tissue Knowledge deficit related to management of necrotic/devitalized tissue Goals: Necrotic/devitalized tissue will be minimized in the wound bed Date Initiated: 02/04/2016 Target Resolution Date: 08/29/2016 Goal Status: Active Interventions: Assess patient pain level pre-, during and post procedure and prior to discharge Provide education on necrotic tissue and debridement process Treatment Activities: Apply topical anesthetic as ordered : 02/04/2016 [name], CHOY ([id number]) Notes: ` Wound/Skin Impairment Nursing Diagnoses: Impaired tissue integrity Goals: Patient/caregiver will verbalize understanding of skin care regimen Date Initiated: 12/17/2015 Target Resolution Date: 08/29/2016 Goal Status: Active Ulcer/skin breakdown will have a volume reduction of 30% by week 4 Date Initiated: 12/17/2015 Target Resolution Date: 08/29/2016 Goal Status: Active Ulcer/skin breakdown will have a volume reduction of 50% by week 8 Date Initiated: 12/17/2015 Target Resolution Date: 08/29/2016 Goal Status: Active Ulcer/skin breakdown will have a volume reduction of 80% by week 12  Date Initiated: 12/17/2015 Target Resolution Date: 08/29/2016 Goal Status: Active Ulcer/skin breakdown will heal within 14 weeks Date Initiated: 12/17/2015 Target Resolution Date: 08/29/2016 Goal Status: Active Interventions: Assess patient/caregiver ability to obtain necessary supplies Assess patient/caregiver ability to perform ulcer/skin care regimen upon admission and as needed Assess ulceration(s) every visit Notes: Electronic Signature(s) Signed: 07/22/2016 5:13:02 PM By: Alejandro Mulling Entered  By: Alejandro Mulling on 07/22/2016 15:55:55 [name] ([id number]) -------------------------------------------------------------------------------- Pain Assessment Details Patient Name: [name] Date of Service: 07/22/2016 3:30 PM Medical Record Patient Account Number: [id number] Number: Treating RN: Phillis Haggis 06/18/1933 (80 [name] Other Clinician: Date of Birth/Sex: Female) Treating ROBSON, MICHAEL Primary Care Zeah Germano: [name] Kailo Kosik/Extender: G Referring Alixis Harmon: Charolotte Capuchin in Treatment: 31 Active Problems Location of Pain Severity and Description of Pain Patient Has Paino No Site Locations With Dressing Change: No Pain Management and Medication Current Pain Management: Electronic Signature(s) Signed: 07/22/2016 5:13:02 PM By: Alejandro Mulling Entered By: Alejandro Mulling on 07/22/2016 15:37:04 [name] ([id number]) -------------------------------------------------------------------------------- Patient/Caregiver Education Details Patient Name: [name] Date of Service: 07/22/2016 3:30 PM Medical Record Patient Account Number: [id number] Number: Treating RN: Phillis Haggis December 23, 1933 (80 [name] Other Clinician: Date of Birth/Gender: Female) Treating ROBSON, MICHAEL Primary Care Physician: [name] Physician/Extender: G Referring Physician: Charolotte Capuchin in Treatment: 66 Education Assessment Education Provided To: Patient Education Topics Provided Wound/Skin Impairment: Handouts: Other: change dressing as ordered Methods: Demonstration, Explain/Verbal Responses: State content correctly Electronic Signature(s) Signed: 07/22/2016 5:13:02 PM By: Alejandro Mulling Entered By: Alejandro Mulling on 07/22/2016 16:07:30 [name] ([id number]) -------------------------------------------------------------------------------- Wound Assessment Details Patient Name: [name] Date of Service: 07/22/2016 3:30 PM Medical Record Patient Account Number: [id number] Number: Treating RN: Phillis Haggis 08/12/1933 (80 [name] Other Clinician: Date of Birth/Sex: Female) Treating ROBSON, MICHAEL Primary Care Dariel Betzer: [name] Xenia Nile/Extender: G Referring Olivya Sobol: Charolotte Capuchin in Treatment: 31 Wound Status Wound Number: 19 Primary Diabetic Wound/Ulcer of the Lower Etiology: Extremity Wound Location: Left Malleolus - Lateral Wound Open Wounding Event: Gradually Appeared Status: Date Acquired: 07/22/2016 Comorbid Arrhythmia, Congestive Heart Failure, Weeks Of Treatment: 0 History: Hypertension, Type II Diabetes Clustered Wound: No Photos Photo Uploaded By: Alejandro Mulling on 07/23/2016 07:45:12 Wound Measurements Length: (cm) 1 Width: (cm) 1 Depth: (cm) 0.1 Area: (cm) 0.785 Volume: (cm) 0.079 % Reduction in Area: % Reduction in Volume: Epithelialization: None Tunneling: No Undermining: No Wound Description Classification: Grade 1 Foul Odor Aft Wound Margin: Distinct, outline attached Slough/Fibrin Exudate Amount: Large Exudate Type: Serous Exudate Color: amber er Cleansing: No o No Wound Bed Granulation Amount: Large (67-100%) Granulation Quality: Red Necrotic Amount: None Present (0%) EMERSYNN, DEATLEY ([id number]) Periwound Skin Texture Texture Color No Abnormalities Noted: No No Abnormalities Noted: No Moisture Temperature / Pain No Abnormalities Noted: No Temperature: No Abnormality Tenderness on Palpation: Yes Wound Preparation Ulcer Cleansing: Rinsed/Irrigated with Saline Topical Anesthetic Applied: None Electronic Signature(s) Signed: 07/22/2016 5:13:02 PM By: Alejandro Mulling Entered By: Alejandro Mulling on 07/22/2016 16:02:27 [name] ([id number]) -------------------------------------------------------------------------------- Wound Assessment Details Patient Name: [name] Date  of Service: 07/22/2016 3:30 PM Medical Record Patient Account Number: [id number] Number: Treating RN: Phillis Haggis Jan 19, 1934 (80 [name] Other Clinician: Date of Birth/Sex: Female) Treating ROBSON, MICHAEL Primary Care Quency Tober: [name] Jamiyla Ishee/Extender: G Referring Navika Hoopes: Charolotte Capuchin in Treatment: 31 Wound Status Wound Number: 9 Primary Diabetic Wound/Ulcer of the Lower Etiology: Extremity Wound Location: Right Achilles Secondary Arterial Insufficiency Ulcer Wounding Event: Gradually Appeared Etiology:  Date Acquired: 06/02/2015 Wound Open Weeks Of Treatment: 31 Status: Clustered Wound: No Comorbid Arrhythmia, Congestive Heart Pending Amputation On Presentation History: Failure, Hypertension, Type II Diabetes Photos Photo Uploaded By: Alejandro Mulling on 07/23/2016 07:45:44 Wound Measurements Length: (cm) 2.4 Width: (cm) 1.2 Depth: (cm) 0.2 Area: (cm) 2.262 Volume: (cm) 0.452 % Reduction in Area: 93.1% % Reduction in Volume: 95.4% Epithelialization: None Tunneling: No Undermining: No Wound Description Classification: Grade 2 Foul Odor Afte Wound Margin: Distinct, outline attached Exudate Amount: Large Exudate Type: Serosanguineous Exudate Color: red, brown r Cleansing: No Wound Bed ASLIN, FARINAS ([id number]) Granulation Amount: Large (67-100%) Granulation Quality: Red, Pink Necrotic Amount: Small (1-33%) Necrotic Quality: Adherent Slough Periwound Skin Texture Texture Color No Abnormalities Noted: No No Abnormalities Noted: No Moisture Temperature / Pain No Abnormalities Noted: No Temperature: No Abnormality Tenderness on Palpation: Yes Wound Preparation Ulcer Cleansing: Rinsed/Irrigated with Saline, Other: soap and water, Topical Anesthetic Applied: Other: lidocaine 4%, Treatment Notes Wound #9 (Right Achilles) 1. Cleansed with: Clean wound with Normal Saline Cleanse wound with antibacterial soap and water 2.  Anesthetic Topical Lidocaine 4% cream to wound bed prior to debridement 4. Dressing Applied: Hydrafera Blue 5. Secondary Dressing Applied ABD Pad Dry Gauze 7. Secured with Tape Notes kerlix, coban Electronic Signature(s) Signed: 07/22/2016 5:13:02 PM By: Alejandro Mulling Entered By: Alejandro Mulling on 07/22/2016 15:58:10 [name] ([id number]) -------------------------------------------------------------------------------- Vitals Details Patient Name: [name] Date of Service: 07/22/2016 3:30 PM Medical Record Patient Account Number: [id number] Number: Treating RN: Phillis Haggis Nov 27, 1933 (82 [name] Other Clinician: Date of Birth/Sex: Female) Treating ROBSON, MICHAEL Primary Care Han Lysne: [name] Princetta Uplinger/Extender: G Referring Jerianne Anselmo: Charolotte Capuchin in Treatment: 31 Vital Signs Time Taken: 15:38 Temperature (F): 97.6 Height (in): 65 Pulse (bpm): 86 Weight (lbs): 160 Respiratory Rate (breaths/min): 16 Body Mass Index (BMI): 26.6 Blood Pressure (mmHg): 117/56 Reference Range: 80 - 120 mg / dl Electronic Signature(s) Signed: 07/22/2016 5:13:02 PM By: Alejandro Mulling Entered By: Alejandro Mulling on 07/22/2016 15:39:11

## 2016-07-23 NOTE — Progress Notes (Signed)
Natalie, [name] ([id number]) Visit Report for 07/22/2016 Chief Complaint Document Details Patient Name: [name], [name]. Date of Service: 07/22/2016 3:30 PM Medical Record Patient Account Number: [id number] Number: Treating RN: Phillis Haggis [birth year] (83 y.o. Other Clinician: Date of Birth/Sex: Female) Treating Demetrius Mahler Primary Care Provider: [name] Provider/Extender: G Referring Provider: Charolotte Capuchin in Treatment: 31 Information Obtained from: Patient Chief Complaint Ms. Chuong presents today for follow-up evaluation of her diabetic foot ulcers and abdominal wound. Electronic Signature(s) Signed: 07/22/2016 4:59:22 PM By: Baltazar Najjar MD Entered By: Baltazar Najjar on 07/22/2016 16:41:49 [name] ([id number]) -------------------------------------------------------------------------------- HPI Details Patient Name: [name] Date of Service: 07/22/2016 3:30 PM Medical Record Patient Account Number: [id number] Number: Treating RN: Phillis Haggis May 08, 1934 (83 y.o. Other Clinician: Date of Birth/Sex: Female) Treating Lillah Standre Primary Care Provider: [name] Provider/Extender: G Referring Provider: Charolotte Capuchin in Treatment: 31 History of Present Illness Location: right posterior heel, right Achilles, right lower quadrant abdomen, right fourth toe amp site Quality: denies pain to any wound Severity: not applicable Timing: denies pain HPI Description: 83 year old patient who is known to be diabetic, was referred to [place] by Dr. Gavin Potters for a right heel ulceration which she's had for a while. She was recently in hospital for a pneumonia and at that time and got delirious and was disoriented and sometime during this time developed a stage II ulcer on her right heel. Her past medical history is significant for bilateral pneumonia which was treated with injectable antibiotics and then to oral  Levaquin which he has completed. She also has acute on chronic diastolic CHF, acute on chronic respiratory failure, end-stage renal disease on hemodialysis, atrial fibrillation, recent stroke, diabetes mellitus. The patient and her son are poor historians but from what I understand she was admitted to the hospital with an acute vascular compromise of her right lower extremity and Dr. Wyn Quaker has done a surgical procedure and we are trying to obtain these notes. There are also some vascular workup done and we will try and obtain these notes. the injury to the left lower quadrant of abdomen and the suprapubic area have been there due to a bruise and have been there for several months and no intervention has been done. 10/11/2015 -- on review of the [facility] records it was noted that the patient was admitted to the hospital on 09/14/2015 with peripheral vascular disease with claudication, end-stage renal disease, pressure ulcer, chronic atrial fibrillation. She was seen by Dr. Wyn Quaker who did her right lower extremity angiogram , angioplasty of the right anterior tibial artery and thrombolysis with TPA of the right popliteal artery, and thrombectomy. She was seen by Dr. Wyn Quaker during this past week and he was pleased with the progress. He did say that if he took her to the operating room for any procedure he would debride the abdominal wound under anesthesia. She was also seen by Dr. Ether Griffins the podiatrist who thought that she may lose her right fourth toe at some stage may need an amputation of this. 10/21/2015 --patient known to Dr. Wyn Quaker and his last office visit from 10/04/2015 has been reviewed. 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 She also had a persistent ulceration of the right heel and markedly this area and a right third and fourth toe and a small scab on the calf but these  are dry and seemed  to be improving. Patient also has a left carotid endarterectomy and multiple interventions to a right brachiocephalic AV fistula. After the visit he had recommended noninvasive studies to recheck her revascularization. He was off the impression that she would likely lose the right fourth toe and the third toe was likely to heal. [name] ([id number]) He was concerned about underlying muscle necrosis on her right heel and midfoot. 11/01/2015 -- an echo done in January of this year showed her left ventricular ejection fraction to be about 50-55%. The patient was seen by the PA and Dr. Driscilla Grammes office and the plan was to take her to the operating room soon to have a debridement under anesthesia for the abdominal wall wound, the Achilles tendon on the right leg and amputation of the right fourth toe. The daughter and the patient do not feel that they would be able to undergo hyperbaric oxygen therapy 5 days a week for 6 weeks. 12/17/15; this is a medically complex woman who I note was recently in this clinic however I was not involved with her care. She returns today with multiple wounds; a) she has a wound in the mid abdomen that is been there since March of this year. I note that she is been to the overall for debridement recently. The exact etiology of this wound is not really clear b) left lower quadrant abdominal wound had some sanguinous drainage when she came in here. The patient fell in January and thinks this may have been secondary to a hematoma. c): The patient has 3 wounds on her right leg including a small wound on the right mid calf, a large area over the Achilles which currently has a wound VAC for the last 6 weeks, also a smaller wound on the distal part of the right heel. As far as I understand most of these wounds are currently been dressed with's calcium alginate. According to her daughter the Achilles wound under the wound VAC is doing well d) the  patient is had an amputation of her left fifth toe in January and the right fourth toe 6 weeks ago secondary to diabetic PAD e) the patient has chronic renal failure on dialysis for the last 2 years secondary to type 2 diabetes on insulin. The daughter's knowledge there is been no biopsy of the abdominal wounds given their current appearance and lack of undefined etiology at have to wonder about calciphylaxis. 12/18/15:Addendum; I have reviewed [facility] I can see no relevant x-rays of the right heel. I note her arteriogram and revascularization of her right lower extremity in April 2017. She had debridement of both abdominal wounds and the right heel and Achilles wound on 11/07/15 at which time she had a right fourth toe ray amputation. The abdominal wounds were debridement again on 6/29. I do not see any relevant pathology of these abdominal wounds 12/24/15; culture I did of the drainage from the midline abdominal wound last week showed both Proteus and ampicillin sensitive enterococcus. I've given her a course of Augmentin adjusted on dialysis days. She has no specific complaints today. Been using Santyl to the abdominal wounds in the right leg wound and the wound VAC on the right Achilles which was initially prescribed by Dr. dew 12/31/15; I have done two punch biopsies of the large midline abdominal. 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 May have been a trauma component of the one on the left lower quadrant however the midline wound had no such history. She has a large area on  the right Achilles heel with a wound VAC prescribed by Dr. dew. A small wound on the right anterior leg.Marland Kitchen. UNFORTUNATELY she has 2 new wounds today. One on the left heel which is probably a pressure area. As well her previous amputation site of her right fourth toe has dehisced and now has a small wound with significant depth at the amputation site. 01/14/2016 -- she returns after 2 weeks and had had a punch biopsy of  abdominal wound done the last visit -- had a biopsy of her midline abdominal wound done and the Pathology diagnosis is that of ulceration, necrosis and inflammation and negative for dysplasia and malignancy. 01/21/16. 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 In the meantime she has new wounds of the left heel the right fourth toe amputation site is opened up. The back is stopped to the right heel area. 01/28/16; the abdominal wounds continued to improve. The extensive wound on her right Achilles also looks stable except for the lower aspect of the wound where there is a large liquefied area that probes right down to her calcaneus. This cultured Proteus last week I have her on Augmentin and doxycycline 1. I think this is Natalie RimaHOPKINS, Tora Y. ([id number]) going to need a course of IV antibiotics and I will call dialysis. X-ray I did last week was negative, I think she is going to need an MRI 02/04/16; right heel MRI as before Saturday. Receiving I believe IV Rocephin at dialysis 02/11/16; as it turns out the patient could not have a MRI as she has a bladder stimulator in place even though it is not currently in use since the beginning of this year. Although she has an allergy to IV contrast she apparently has done well with premedication so we will have to go for a CT scan with contrast. In the meantime she has had a fall now has a large skin tear on her left upper arm. She went to the ER and they suggested Tegaderm over topical antibiotics 03/03/16 currently patient returns after having been hospitalized for 2 weeks and subsequently [facility]. She actually seems to be doing excellent compared to even when we last saw her according to our nursing staff. Both patient and her daughter are extremely encouraged at how well she is presenting at this point in time. Overall the biggest issue is still the right  Achilles area which is being managed at this point in time by Dr. Wyn Quakerew. Patient is currently utilizing a wound VAC to that region. 03/17/16; [facility]. Using Aquacel Ag to the wounds on her bilateral feet and still Prisma to the small open area on her abdomen. 03/31/16 at this point in time patient has been tolerating the dressing changes currently. She fortunately has no worsening of her symptoms although she tells me that the nurse who is caring [facility] decided that nothing was needed in regard to the lower abdominal wound from a dressing standpoint at this time. she is really not having significant discomfort or pain at this point she continues to have some tunneling in the proximal Achilles wound region. 04/07/16 patient continues to do well on evaluation today. Even the Achilles wound which has been the most tender is not giving her as much trouble. Unfortunately the PolyMem dressings that we utilize last week really did not seem to benefit her in particular.  Obviously we will discontinue that at this point in time today. 04-15-16:Ms. Schuneman is accompanied today by her daughter. She is still residing in an SNF undergoing dialysis, continues to receive antibiotics during dialysis as prescribed by Dr. Sampson Goon of infectious disease. She has a follow-up appointment with Dr. Sampson Goon on 04-17-16 to discuss the continuation of these antibiotics. Ms. Dattilio denies any pain to any of the 3 remaining wounds she does admit to changing to [employer] surgical shoes for safety while ambulating with physical therapy. She denies any falls since her last appointment here although she and her daughter do admit to increased tremors of unclear etiology since her last appointment. She has tolerated the dressing changes that were prescribed last week. 04-22-16 Ms. Wickstrom presents today accompanied by her daughter for evaluation of her diabetic  foot ulcers. She is still residing in an SNF, she continues dialysis 3 times weekly. She'll follow up with Dr. Sampson Goon last Friday, and at that appointment IV antibiotics were discontinued. According to Ms. Depolo and her daughter if there is any regression of her wounds Dr. Sampson Goon will consider re-starting the antibiotics. Ms. Hoopingarner daughter states that since the discontinuation of antibiotic therapy her "twitches" have resolved. 04/29/16; I have not seen this patient in quite some time however she is completed triple antibiotic therapy given at dialysis for osteomyelitis as prescribed by infectious disease. She is still being followed by Dr. dew of vascular surgery. We are using Hydrofera Blue to the surface of these wounds. She currently has 2 open areas a substantial area over her Achilles area although this is a lot better than the last time I saw this. She also has a small wound superiorly from this wound which has a superior probing depth of 2 cm. 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 My biopsy did not prove this nevertheless I think this is the correct clinical diagnosis. We are now following her for an area on the right Achilles part of her heel. This is not any different from last week. JAYCELYNN, KNICKERBOCKER ([id number]) She also has a small tunneling area just above this. And unfortunately this week there is been a reopening of an area where her right fourth toe was previously amputated. She is completed antibiotics 05/13/16; she has a new reopening on the mid abdominal wound in the same site is previously. This is a small open area. Apligraf #1 today to the areas on the right heel o2 still an open area in the fourth toe amputation site 05/27/16; the  areas on her abdomen are completely closed over. Small open area from last time is closed. Apligraf #2 today to the areas on the right heel. The 4th toe amputation site is also heel 06/10/16; we did not have an Apligraf to put on today. This appeared tunneling wound on the right posterior calf appears to be closed. The more substantial area on her Achilles itself is improved. 06/24/16; I reapplied her third Apligraf today although we have not had one debridement last time. The original tunneling wound is not as closed as last time. The more substantial area on her Achilles itself is improved with advancing epithelialization 07/08/16 Apligraf #4 today. The original long area over her Achilles as a healthy-looking area at the superior aspect and a small divot inferiorly.  Both of the wound surface is look healthy 07/22/16; patient arrived today with a 2 original wounds on the Achilles part of her ankle too small for another application of Apligraf. This is on the right Achilles area. The small divot inferiorly looks as though it may have skin over most of this and I can't really see an open area. She did have an erythematous area over the medial malleolus with some drainage this looks like a rapid injury not cellulitis Electronic Signature(s) Signed: 07/22/2016 4:59:22 PM By: Baltazar Najjar MD Entered By: Baltazar Najjar on 07/22/2016 16:44:50 [name] ([id number]) -------------------------------------------------------------------------------- Physical Exam Details Patient Name: [name] Date of Service: 07/22/2016 3:30 PM Medical Record Patient Account Number: [id number] Number: Treating RN: Phillis Haggis [birth year] (82 y.o. Other Clinician: Date of Birth/Sex: Female) Treating Iline Buchinger Primary Care Provider: [name] Provider/Extender: G Referring Provider: Charolotte Capuchin in Treatment: 31 Constitutional Sitting or standing Blood Pressure is  within target range for patient.. Pulse regular and within target range for patient.Marland Kitchen Respirations regular, non-labored and within target range.. Temperature is normal and within the target range for the patient.. Patient's appearance is neat and clean. Appears in no acute distress. Well nourished and well developed.Marland Kitchen Respiratory Respiratory effort is easy and symmetric bilaterally. Rate is normal at rest and on room air.. Cardiovascular Pedal pulses are faint but palpable. Lymphatic Nonpalpable in the right popliteal or inguinal area. Notes Wound exam; the area on her Achilles continues to improve the small debit may not actually be open. I can't see an open area here. We use Hydrofera Blue to both wound areas. Electronic Signature(s) Signed: 07/22/2016 4:59:22 PM By: Baltazar Najjar MD Entered By: Baltazar Najjar on 07/22/2016 16:46:16 [name] ([id number]) -------------------------------------------------------------------------------- Physician Orders Details Patient Name: [name] Date of Service: 07/22/2016 3:30 PM Medical Record Patient Account Number: [id number] Number: Treating RN: Phillis Haggis 1934-02-19 (82 y.o. Other Clinician: Date of Birth/Sex: Female) Treating Alzena Gerber Primary Care Provider: [name] Provider/Extender: G Referring Provider: Charolotte Capuchin in Treatment: 80 Verbal / Phone Orders: Yes Clinician: Ashok Cordia, Debi Read Back and Verified: Yes Diagnosis Coding Wound Cleansing Wound #19 Right,Lateral Malleolus o Clean wound with Normal Saline. o Cleanse wound with mild soap and water o May shower with protection. Wound #9 Right Achilles o Clean wound with Normal Saline. o Cleanse wound with mild soap and water o May shower with protection. Anesthetic Wound #19 Right,Lateral Malleolus o Topical Lidocaine 4% cream applied to wound bed prior to debridement - all wounds for clinic use  only Wound #9 Right Achilles o Topical Lidocaine 4% cream applied to wound bed prior to debridement - all wounds for clinic use only Primary Wound Dressing Wound #9 Right Achilles o Hydrafera Blue Wound #19 Right,Lateral Malleolus o Aquacel Ag Secondary Dressing Wound #9 Right Achilles o ABD pad o Dry Gauze Wound #19 Right,Lateral Malleolus o Dry Gauze o Foam APRYL, BRYMER ([id number]) Dressing Change Frequency Wound #19 Right,Lateral Malleolus o Three times weekly - HHRN to change only the outside dressing do not take off the steri-strips or the mepitel. Pt has a skin sub/graft on. Wound #9 Right Achilles o Three times weekly - HHRN to change only the outside dressing do not take off the steri-strips or the mepitel. Pt has a skin sub/graft on. Follow-up Appointments Wound #19 Right,Lateral Malleolus o Return Appointment in 1 week. Wound #9 Right Achilles o Return Appointment in 1 week. Edema Control Wound #19 Right,Lateral Malleolus   o Kerlix and Coban - Right Lower Extremity o Elevate legs to the level of the heart and pump ankles as often as possible Wound #9 Right Achilles o Kerlix and Coban - Right Lower Extremity o Elevate legs to the level of the heart and pump ankles as often as possible Off-Loading Wound #19 Right,Lateral Malleolus o Other: - Float heels Wound #9 Right Achilles o Other: - Float heels Electronic Signature(s) Signed: 07/22/2016 4:59:22 PM By: Baltazar Najjar MD Signed: 07/22/2016 5:13:02 PM By: Alejandro Mulling Entered By: Alejandro Mulling on 07/22/2016 16:05:58 [name] ([id number]) -------------------------------------------------------------------------------- Problem List Details Patient Name: [name]. Date of Service: 07/22/2016 3:30 PM Medical Record Patient Account Number: [id number] Number: Treating RN: Phillis Haggis 1933/09/17 (82 y.o. Other Clinician: Date of  Birth/Sex: Female) Treating Bina Veenstra Primary Care Provider: [name] Provider/Extender: G Referring Provider: Charolotte Capuchin in Treatment: 31 Active Problems ICD-10 Encounter Code Description Active Date Diagnosis E11.621 Type 2 diabetes mellitus with foot ulcer 12/17/2015 Yes L97.512 Non-pressure chronic ulcer of other part of right foot with 03/31/2016 Yes fat layer exposed E11.51 Type 2 diabetes mellitus with diabetic peripheral 12/17/2015 Yes angiopathy without gangrene Inactive Problems ICD-10 Code Description Active Date Inactive Date M86.671 Other chronic osteomyelitis, right ankle and foot 04/15/2016 04/15/2016 S31.104A Unspecified open wound of abdominal wall, left lower 12/17/2015 12/17/2015 quadrant without penetration into peritoneal cavity, initial encounter S31.103D Unspecified open wound of abdominal wall, right lower 04/15/2016 04/15/2016 quadrant without penetration into peritoneal cavity, subsequent encounter Resolved Problems Electronic Signature(s) SHANDALE, MALAK ([id number]) Signed: 07/22/2016 4:59:22 PM By: Baltazar Najjar MD Entered By: Baltazar Najjar on 07/22/2016 16:41:25 [name] ([id number]) -------------------------------------------------------------------------------- Progress Note Details Patient Name: [name] Date of Service: 07/22/2016 3:30 PM Medical Record Patient Account Number: [id number] Number: Treating RN: Phillis Haggis 1933/10/11 (82 y.o. Other Clinician: Date of Birth/Sex: Female) Treating Rheba Diamond Primary Care Provider: [name] Provider/Extender: G Referring Provider: Charolotte Capuchin in Treatment: 31 Subjective Chief Complaint Information obtained from Patient Ms. Krah presents today for follow-up evaluation of her diabetic foot ulcers and abdominal wound. History of Present Illness (HPI) The following HPI elements were documented for the patient's  wound: Location: right posterior heel, right Achilles, right lower quadrant abdomen, right fourth toe amp site Quality: denies pain to any wound Severity: not applicable Timing: denies pain 83 year old patient who is known to be diabetic, was referred to [place] by Dr. Gavin Potters for a right heel ulceration which she's had for a while. She was recently in hospital for a pneumonia and at that time and got delirious and was disoriented and sometime during this time developed a stage II ulcer on her right heel. Her past medical history is significant for bilateral pneumonia which was treated with injectable antibiotics and then to oral Levaquin which he has completed. She also has acute on chronic diastolic CHF, acute on chronic respiratory failure, end-stage renal disease on hemodialysis, atrial fibrillation, recent stroke, diabetes mellitus. The patient and her son are poor historians but from what I understand she was admitted to the hospital with an acute vascular compromise of her right lower extremity and Dr. Wyn Quaker has done a surgical procedure and we are trying to obtain these notes. There are also some vascular workup done and we will try and obtain these notes. the injury to the left lower quadrant of abdomen and the suprapubic area have been there due to a bruise and have been there for several months and no  intervention has been done. 10/11/2015 -- on review of the [facility] records it was noted that the patient was admitted to the hospital on 09/14/2015 with peripheral vascular disease with claudication, end-stage renal disease, pressure ulcer, chronic atrial fibrillation. She was seen by Dr. Wyn Quaker who did her right lower extremity angiogram , angioplasty of the right anterior tibial artery and thrombolysis with TPA of the right popliteal artery, and thrombectomy. She was seen by Dr. Wyn Quaker during this past week and he was pleased with the progress. He did say that if he took her to the operating  room for any procedure he would debride the abdominal wound under anesthesia. She was also seen by Dr. Ether Griffins the podiatrist who thought that she may lose her right fourth toe at some stage may need an amputation of this. CHANAH, TIDMORE ([id number]) 10/21/2015 --patient known to Dr. Wyn Quaker and his last office visit from 10/04/2015 has been reviewed. 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 She also had a persistent ulceration of the right heel and markedly this area and a right third and fourth toe and a small scab on the calf but these are dry and seemed to be improving. Patient also has a left carotid endarterectomy and multiple interventions to a right brachiocephalic AV fistula. After the visit he had recommended noninvasive studies to recheck her revascularization. He was off the impression that she would likely lose the right fourth toe and the third toe was likely to heal. He was concerned about underlying muscle necrosis on her right heel and midfoot. 11/01/2015 -- an echo done in January of this year showed her left ventricular ejection fraction to be about 50-55%. The patient was seen by the PA and Dr. Driscilla Grammes office and the plan was to take her to the operating room soon to have a debridement under anesthesia for the abdominal wall wound, the Achilles tendon on the right leg and amputation of the right fourth toe. The daughter and the patient do not feel that they would be able to undergo hyperbaric oxygen therapy 5 days a week for 6 weeks. 12/17/15; this is a medically complex woman who I note was recently in this clinic however I was not involved with her care. She returns today with multiple wounds; a) she has a wound in the mid abdomen that is been there since March of this year. I note that she is been to the overall for debridement recently. The exact etiology  of this wound is not really clear b) left lower quadrant abdominal wound had some sanguinous drainage when she came in here. The patient fell in January and thinks this may have been secondary to a hematoma. c): The patient has 3 wounds on her right leg including a small wound on the right mid calf, a large area over the Achilles which currently has a wound VAC for the last 6 weeks, also a smaller wound on the distal part of the right heel. As far as I understand most of these wounds are currently been dressed with's calcium alginate. According to her daughter the Achilles wound under the wound VAC is doing well d) the patient is had an amputation of her left fifth toe in January and the right fourth toe 6 weeks ago secondary to diabetic PAD e) the patient has chronic renal failure on dialysis for the last 2 years secondary to type 2 diabetes  on insulin. The daughter's knowledge there is been no biopsy of the abdominal wounds given their current appearance and lack of undefined etiology at have to wonder about calciphylaxis. 12/18/15:Addendum; I have reviewed [facility] I can see no relevant x-rays of the right heel. I note her arteriogram and revascularization of her right lower extremity in April 2017. She had debridement of both abdominal wounds and the right heel and Achilles wound on 11/07/15 at which time she had a right fourth toe ray amputation. The abdominal wounds were debridement again on 6/29. I do not see any relevant pathology of these abdominal wounds 12/24/15; culture I did of the drainage from the midline abdominal wound last week showed both Proteus and ampicillin sensitive enterococcus. I've given her a course of Augmentin adjusted on dialysis days. She has no specific complaints today. Been using Santyl to the abdominal wounds in the right leg wound and the wound VAC on the right Achilles which was initially prescribed by Dr. dew 12/31/15; I have done two punch biopsies of  the large midline abdominal. 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 May have been a trauma component of the one on the left lower quadrant however the midline wound had no such history. She has a large area on the right Achilles heel with a wound VAC prescribed by Dr. dew. A small wound on the right anterior leg.Marland Kitchen UNFORTUNATELY she has 2 new wounds today. One on the left heel which is probably a pressure area. As well her previous amputation site of her right fourth toe has dehisced and now has a small wound with significant depth at the amputation site. 01/14/2016 -- she returns after 2 weeks and had had a punch biopsy of abdominal wound done the last visit RAECHELLE, SARTI ([id number]) -- had a biopsy of her midline abdominal wound done and the Pathology diagnosis is that of ulceration, necrosis and inflammation and negative for dysplasia and malignancy. 01/21/16. 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 In the meantime she has new wounds of the left heel the right fourth toe amputation site is opened up. The back is stopped to the right heel area. 01/28/16; the abdominal wounds continued to improve. The extensive wound on her right Achilles also looks stable except for the lower aspect of the wound where there is a large liquefied area that probes right down to her calcaneus. This cultured Proteus last week I have her on Augmentin and doxycycline 1. I think this is going to need a course of IV antibiotics and I will call dialysis. X-ray I did last week was negative, I think she is going to need an MRI 02/04/16; right heel MRI as before Saturday. Receiving I believe IV Rocephin at dialysis 02/11/16; as it turns out the patient could not have a MRI as she has a bladder stimulator in place even though it is not currently in use since the beginning of this year. Although she has an allergy to IV contrast she apparently has done well  with premedication so we will have to go for a CT scan with contrast. In the meantime she has had a fall now has a large skin tear on her left upper arm. She went to the ER and they suggested Tegaderm over topical antibiotics 03/03/16 currently patient returns after having been hospitalized for 2 weeks and subsequently transferred to [facility] nursing facility. She actually seems to be doing excellent compared to even when we  last saw her according to our nursing staff. Both patient and her daughter are extremely encouraged at how well she is presenting at this point in time. Overall the biggest issue is still the right Achilles area which is being managed at this point in time by Dr. Wyn Quaker. Patient is currently utilizing a wound VAC to that region. 03/17/16; patient is at [facility] nursing home still. Using Aquacel Ag to the wounds on her bilateral feet and still Prisma to the small open area on her abdomen. 03/31/16 at this point in time patient has been tolerating the dressing changes currently. She fortunately has no worsening of her symptoms although she tells me that the nurse who is caring for her at [facility] nursing facility decided that nothing was needed in regard to the lower abdominal wound from a dressing standpoint at this time. she is really not having significant discomfort or pain at this point she continues to have some tunneling in the proximal Achilles wound region. 04/07/16 patient continues to do well on evaluation today. Even the Achilles wound which has been the most tender is not giving her as much trouble. Unfortunately the PolyMem dressings that we utilize last week really did not seem to benefit her in particular. Obviously we will discontinue that at this point in time today. 04-15-16:Ms. Pribble is accompanied today by her daughter. She is still residing in an SNF undergoing dialysis, continues to receive antibiotics during dialysis as prescribed by Dr. Sampson Goon of  infectious disease. She has a follow-up appointment with Dr. Sampson Goon on 04-17-16 to discuss the continuation of these antibiotics. Ms. Schlottman denies any pain to any of the 3 remaining wounds she does admit to changing to [employer] surgical shoes for safety while ambulating with physical therapy. She denies any falls since her last appointment here although she and her daughter do admit to increased tremors of unclear etiology since her last appointment. She has tolerated the dressing changes that were prescribed last week. 04-22-16 Ms. Tucker presents today accompanied by her daughter for evaluation of her diabetic foot ulcers. She is still residing in an SNF, she continues dialysis 3 times weekly. She'll follow up with Dr. Sampson Goon last Friday, and at that appointment IV antibiotics were discontinued. According to Ms. Belardo and her daughter if there is any regression of her wounds Dr. Sampson Goon will consider re-starting the antibiotics. Ms. Rebuck daughter states that since the discontinuation of antibiotic therapy her "twitches" have resolved. 04/29/16; I have not seen this patient in quite some time however she is completed triple antibiotic therapy given at dialysis for osteomyelitis as prescribed by infectious disease. She is still being followed by Dr. Wyn Quaker [name] ([id number]) of vascular surgery. We are using Hydrofera Blue to the surface of these wounds. She currently has 2 open areas a substantial area over her Achilles area although this is a lot better than the last time I saw this. She also has a small wound superiorly from this wound which has a superior probing depth of 2 cm. 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 My biopsy did not prove  this nevertheless I think this is the correct clinical diagnosis. We are now following her for an area on the right Achilles part of her heel. This is not any different from  last week. She also has a small tunneling area just above this. And unfortunately this week there is been a reopening of an area where her right fourth toe was previously amputated. She is completed antibiotics 05/13/16; she has a new reopening on the mid abdominal wound in the same site is previously. This is a small open area. Apligraf #1 today to the areas on the right heel o2 still an open area in the fourth toe amputation site 05/27/16; the areas on her abdomen are completely closed over. Small open area from last time is closed. Apligraf #2 today to the areas on the right heel. The 4th toe amputation site is also heel 06/10/16; we did not have an Apligraf to put on today. This appeared tunneling wound on the right posterior calf appears to be closed. The more substantial area on her Achilles itself is improved. 06/24/16; I reapplied her third Apligraf today although we have not had one debridement last time. The original tunneling wound is not as closed as last time. The more substantial area on her Achilles itself is improved with advancing epithelialization 07/08/16 Apligraf #4 today. The original long area over her Achilles as a healthy-looking area at the superior aspect and a small divot inferiorly. Both of the wound surface is look healthy 07/22/16; patient arrived today with a 2 original wounds on the Achilles part of her ankle too small for another application of Apligraf. This is on the right Achilles area. The small divot inferiorly looks as though it may have skin over most of this and I can't really see an open area. She did have an erythematous area over the medial malleolus with some drainage this looks like a rapid injury not cellulitis Objective Constitutional Sitting or standing Blood Pressure is within  target range for patient.. Pulse regular and within target range for patient.Marland Kitchen Respirations regular, non-labored and within target range.. Temperature is normal and within the target range for the patient.. Patient's appearance is neat and clean. Appears in no acute distress. Well nourished and well developed.. Vitals Time Taken: 3:38 PM, Height: 65 in, Weight: 160 lbs, BMI: 26.6, Temperature: 97.6 F, Pulse: 86 bpm, Respiratory Rate: 16 breaths/min, Blood Pressure: 117/56 mmHg. Respiratory Respiratory effort is easy and symmetric bilaterally. Rate is normal at rest and on room air.. Cardiovascular Pedal pulses are faint but palpable. EDDYE, BROXTERMAN ([id number]) Lymphatic Nonpalpable in the right popliteal or inguinal area. General Notes: Wound exam; the area on her Achilles continues to improve the small debit may not actually be open. I can't see an open area here. We use Hydrofera Blue to both wound areas. Integumentary (Hair, Skin) Wound #19 status is Open. Original cause of wound was Gradually Appeared. The wound is located on the Right,Lateral Malleolus. The wound measures 1cm length x 1cm width x 0.1cm depth; 0.785cm^2 area and 0.079cm^3 volume. There is no tunneling or undermining noted. There is a large amount of serous drainage noted. The wound margin is distinct with the outline attached to the wound base. There is large (67-100%) red granulation within the wound bed. There is no necrotic tissue within the wound bed. Periwound temperature was noted as No Abnormality. The periwound has tenderness on palpation. Wound #9 status is Open. Original cause of wound was Gradually Appeared. The wound is located on the Right Achilles. The wound measures 2.4cm length x 1.2cm width x 0.2cm depth; 2.262cm^2 area and 0.452cm^3 volume. There is no tunneling or undermining noted. There is a large amount of  serosanguineous drainage noted. The wound margin is distinct with the outline attached to  the wound base. There is large (67-100%) red, pink granulation within the wound bed. There is a small (1-33%) amount of necrotic tissue within the wound bed including Adherent Slough. Periwound temperature was noted as No Abnormality. The periwound has tenderness on palpation. Assessment Active Problems ICD-10 E11.621 - Type 2 diabetes mellitus with foot ulcer L97.512 - Non-pressure chronic ulcer of other part of right foot with fat layer exposed E11.51 - Type 2 diabetes mellitus with diabetic peripheral angiopathy without gangrene Plan Wound Cleansing: Wound #19 Right,Lateral Malleolus: Clean wound with Normal Saline. Cleanse wound with mild soap and water May shower with protection. Wound #9 Right Achilles: Clean wound with Normal Saline. Cleanse wound with mild soap and water SUBRENA, DEVEREUX ([id number]) May shower with protection. Anesthetic: Wound #19 Right,Lateral Malleolus: Topical Lidocaine 4% cream applied to wound bed prior to debridement - all wounds for clinic use only Wound #9 Right Achilles: Topical Lidocaine 4% cream applied to wound bed prior to debridement - all wounds for clinic use only Primary Wound Dressing: Wound #9 Right Achilles: Hydrafera Blue Wound #19 Right,Lateral Malleolus: Aquacel Ag Secondary Dressing: Wound #9 Right Achilles: ABD pad Dry Gauze Wound #19 Right,Lateral Malleolus: Dry Gauze Foam Dressing Change Frequency: Wound #19 Right,Lateral Malleolus: Three times weekly - HHRN to change only the outside dressing do not take off the steri-strips or the mepitel. Pt has a skin sub/graft on. Wound #9 Right Achilles: Three times weekly - HHRN to change only the outside dressing do not take off the steri-strips or the mepitel. Pt has a skin sub/graft on. Follow-up Appointments: Wound #19 Right,Lateral Malleolus: Return Appointment in 1 week. Wound #9 Right Achilles: Return Appointment in 1 week. Edema Control: Wound #19 Right,Lateral  Malleolus: Kerlix and Coban - Right Lower Extremity Elevate legs to the level of the heart and pump ankles as often as possible Wound #9 Right Achilles: Kerlix and Coban - Right Lower Extremity Elevate legs to the level of the heart and pump ankles as often as possible Off-Loading: Wound #19 Right,Lateral Malleolus: Other: - Float heels Wound #9 Right Achilles: Other: - Float heels change to hydrofera blue DALIYA, PARCHMENT ([id number]) aquacel ag to the medial malleous Electronic Signature(s) Signed: 07/22/2016 4:59:22 PM By: Baltazar Najjar MD Entered By: Baltazar Najjar on 07/22/2016 16:47:19 [name] ([id number]) -------------------------------------------------------------------------------- SuperBill Details Patient Name: [name] Date of Service: 07/22/2016 Medical Record Patient Account Number: [id number] Number: Treating RN: Phillis Haggis 05-31-1934 (82 y.o. Other Clinician: Date of Birth/Sex: Female) Treating Tawfiq Favila Primary Care Provider: [name] Provider/Extender: G Referring Provider: [name] Service Line: Outpatient Weeks in Treatment: 31 Diagnosis Coding ICD-10 Codes Code Description E11.621 Type 2 diabetes mellitus with foot ulcer L97.512 Non-pressure chronic ulcer of other part of right foot with fat layer exposed E11.51 Type 2 diabetes mellitus with diabetic peripheral angiopathy without gangrene Facility Procedures CPT4 Code: [id number] Description: 99214 - WOUND CARE VISIT-LEV 4 EST PT Modifier: Quantity: 1 Physician Procedures CPT4 Code Description: [id number] - WC PHYS LEVEL 2 - EST PT ICD-10 Description Diagnosis E11.621 Type 2 diabetes mellitus with foot ulcer L97.512 Non-pressure chronic ulcer of other part of right fo Modifier: ot with fat lay Quantity: 1 er exposed Electronic Signature(s) Signed: 07/22/2016 5:13:02 PM By: Alejandro Mulling Previous Signature: 07/22/2016 4:59:22 PM Version By:  Baltazar Najjar MD Entered By: Alejandro Mulling on 07/22/2016 17:04:18

## 2016-07-29 ENCOUNTER — Encounter: Payer: Medicare Other | Admitting: Internal Medicine

## 2016-07-29 DIAGNOSIS — E11621 Type 2 diabetes mellitus with foot ulcer: Secondary | ICD-10-CM | POA: Diagnosis not present

## 2016-07-30 NOTE — Progress Notes (Signed)
NINOSKA, GOSWICK ([id number]) Visit Report for 07/29/2016 Chief Complaint Document Details Patient Name: [name], [name]. Date of Service: 07/29/2016 8:45 AM Medical Record Patient Account Number: [id number] Number: Treating RN: Phillis Haggis 1934-03-30 (83 [name] Other Clinician: Date of Birth/Sex: Female) Treating Kinley Dozier Primary Care Provider: [name] Provider/Extender: G Referring Provider: Charolotte Capuchin in Treatment: 32 Information Obtained from: Patient Chief Complaint Ms. Ruan presents today for follow-up evaluation of her diabetic foot ulcers and abdominal wound. Electronic Signature(s) Signed: 07/29/2016 3:56:26 PM By: Baltazar Najjar MD Entered By: Baltazar Najjar on 07/29/2016 13:16:06 [name] ([id number]) -------------------------------------------------------------------------------- Debridement Details Patient Name: [name] Date of Service: 07/29/2016 8:45 AM Medical Record Patient Account Number: [id number] Number: Treating RN: Phillis Haggis 03/21/1934 (83 [name] Other Clinician: Date of Birth/Sex: Female) Treating Jaimi Belle Primary Care Provider: [name] Provider/Extender: G Referring Provider: Charolotte Capuchin in Treatment: 32 Debridement Performed for Wound #9 Right Achilles Assessment: Performed By: Physician Maxwell Caul, MD Debridement: Debridement Pre-procedure Yes - 09:11 Verification/Time Out Taken: Start Time: 09:12 Pain Control: Lidocaine 4% Topical Solution Level: Skin/Subcutaneous Tissue Total Area Debrided (L x 2.4 (cm) x 0.7 (cm) = 1.68 (cm) W): Tissue and other Viable, Non-Viable, Exudate, Fibrin/Slough, Subcutaneous material debrided: Instrument: Curette Bleeding: Minimum Hemostasis Achieved: Pressure End Time: 09:14 Procedural Pain: 0 Post Procedural Pain: 0 Response to Treatment: Procedure was tolerated well Post Debridement Measurements of  Total Wound Length: (cm) 2.4 Width: (cm) 0.7 Depth: (cm) 0.2 Volume: (cm) 0.264 Character of Wound/Ulcer Post Requires Further Debridement Debridement: Severity of Tissue Post Debridement: Fat layer exposed Post Procedure Diagnosis Same as Pre-procedure Electronic Signature(s) Signed: 07/29/2016 3:56:26 PM By: Baltazar Najjar MD Signed: 07/29/2016 4:23:48 PM By: Adair Laundry ([id number]) Entered By: Alejandro Mulling on 07/29/2016 09:14:17 [name] ([id number]) -------------------------------------------------------------------------------- HPI Details Patient Name: [name] Date of Service: 07/29/2016 8:45 AM Medical Record Patient Account Number: [id number] Number: Treating RN: Phillis Haggis 30-Dec-1933 (83 [name] Other Clinician: Date of Birth/Sex: Female) Treating Maniya Donovan Primary Care Provider: [name] Provider/Extender: G Referring Provider: Charolotte Capuchin in Treatment: 32 History of Present Illness Location: right posterior heel, right Achilles, right lower quadrant abdomen, right fourth toe amp site Quality: denies pain to any wound Severity: not applicable Timing: denies pain HPI Description: 83 year old patient who is known to be diabetic, was referred to [place] by Dr. Gavin Potters for a right heel ulceration which she's had for a while. She was recently in hospital for a pneumonia and at that time and got delirious and was disoriented and sometime during this time developed a stage II ulcer on her right heel. Her past medical history is significant for bilateral pneumonia which was treated with injectable antibiotics and then to oral Levaquin which he has completed. She also has acute on chronic diastolic CHF, acute on chronic respiratory failure, end-stage renal disease on hemodialysis, atrial fibrillation, recent stroke, diabetes mellitus. The patient and her son are poor historians but from what I  understand she was admitted to the hospital with an acute vascular compromise of her right lower extremity and Dr. Wyn Quaker has done a surgical procedure and we are trying to obtain these notes. There are also some vascular workup done and we will try and obtain these notes. the injury to the left lower quadrant of abdomen and the suprapubic area have been there due to a bruise and have been there for several months and no intervention has been done.  10/11/2015 -- on review of the electronics records it was noted that the patient was admitted to the hospital on 09/14/2015 with peripheral vascular disease with claudication, end-stage renal disease, pressure ulcer, chronic atrial fibrillation. She was seen by Dr. Wyn Quaker who did her right lower extremity angiogram , angioplasty of the right anterior tibial artery and thrombolysis with TPA of the right popliteal artery, and thrombectomy. She was seen by Dr. Wyn Quaker during this past week and he was pleased with the progress. He did say that if he took her to the operating room for any procedure he would debride the abdominal wound under anesthesia. She was also seen by Dr. Ether Griffins the podiatrist who thought that she may lose her right fourth toe at some stage may need an amputation of this. 10/21/2015 --patient known to Dr. Wyn Quaker and his last office visit from 10/04/2015 has been reviewed. 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 She also had a persistent ulceration of the right heel and markedly this area and a right third and fourth toe and a small scab on the calf but these are dry and seemed to be improving. Patient also has a left carotid endarterectomy and multiple interventions to a right brachiocephalic AV fistula. After the visit he had recommended noninvasive studies to recheck her revascularization. He was off the impression that she  would likely lose the right fourth toe and the third toe was likely to heal. [name] ([id number]) He was concerned about underlying muscle necrosis on her right heel and midfoot. 11/01/2015 -- an echo done in January of this year showed her left ventricular ejection fraction to be about 50-55%. The patient was seen by the PA and Dr. Driscilla Grammes office and the plan was to take her to the operating room soon to have a debridement under anesthesia for the abdominal wall wound, the Achilles tendon on the right leg and amputation of the right fourth toe. The daughter and the patient do not feel that they would be able to undergo hyperbaric oxygen therapy 5 days a week for 6 weeks. 12/17/15; this is a medically complex woman who I note was recently in this clinic however I was not involved with her care. She returns today with multiple wounds; a) she has a wound in the mid abdomen that is been there since March of this year. I note that she is been to the overall for debridement recently. The exact etiology of this wound is not really clear b) left lower quadrant abdominal wound had some sanguinous drainage when she came in here. The patient fell in January and thinks this may have been secondary to a hematoma. c): The patient has 3 wounds on her right leg including a small wound on the right mid calf, a large area over the Achilles which currently has a wound VAC for the last 6 weeks, also a smaller wound on the distal part of the right heel. As far as I understand most of these wounds are currently been dressed with's calcium alginate. According to her daughter the Achilles wound under the wound VAC is doing well d) the patient is had an amputation of her left fifth toe in January and the right fourth toe 6 weeks ago secondary to diabetic PAD e) the patient has chronic renal failure on dialysis for the last 2 years secondary to type 2 diabetes on insulin. The daughter's knowledge  there is been  no biopsy of the abdominal wounds given their current appearance and lack of undefined etiology at have to wonder about calciphylaxis. 12/18/15:Addendum; I have reviewed [facility] I can see no relevant x-rays of the right heel. I note her arteriogram and revascularization of her right lower extremity in April 2017. She had debridement of both abdominal wounds and the right heel and Achilles wound on 11/07/15 at which time she had a right fourth toe ray amputation. The abdominal wounds were debridement again on 6/29. I do not see any relevant pathology of these abdominal wounds 12/24/15; culture I did of the drainage from the midline abdominal wound last week showed both Proteus and ampicillin sensitive enterococcus. I've given her a course of Augmentin adjusted on dialysis days. She has no specific complaints today. Been using Santyl to the abdominal wounds in the right leg wound and the wound VAC on the right Achilles which was initially prescribed by Dr. dew 12/31/15; I have done two punch biopsies of the large midline abdominal. 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 May have been a trauma component of the one on the left lower quadrant however the midline wound had no such history. She has a large area on the right Achilles heel with a wound VAC prescribed by Dr. dew. A small wound on the right anterior leg.Marland Kitchen. UNFORTUNATELY she has 2 new wounds today. One on the left heel which is probably a pressure area. As well her previous amputation site of her right fourth toe has dehisced and now has a small wound with significant depth at the amputation site. 01/14/2016 -- she returns after 2 weeks and had had a punch biopsy of abdominal wound done the last visit -- had a biopsy of her midline abdominal wound done and the Pathology diagnosis is that of ulceration, necrosis and inflammation and negative for dysplasia and malignancy. 01/21/16. 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 In the meantime she has new wounds of the left heel the right fourth toe amputation site is opened up. The back is stopped to the right heel area. 01/28/16; the abdominal wounds continued to improve. The extensive wound on her right Achilles also looks stable except for the lower aspect of the wound where there is a large liquefied area that probes right down to her calcaneus. This cultured Proteus last week I have her on Augmentin and doxycycline 1. I think this is [name], Natalie Y. ([id number]) going to need a course of IV antibiotics and I will call dialysis. X-ray I did last week was negative, I think she is going to need an MRI 02/04/16; right heel MRI as before Saturday. Receiving I believe IV Rocephin at dialysis 02/11/16; as it turns out the patient could not have a MRI as she has a bladder stimulator in place even though it is not currently in use since the beginning of this year. Although she has an allergy to IV contrast she apparently has done well with premedication so we will have to go for a CT scan with contrast. In the meantime she has had a fall now has a large skin tear on her left upper arm. She went to the ER and they suggested Tegaderm over topical antibiotics 03/03/16 currently patient returns after having been hospitalized for 2 weeks and subsequently transferred [facility]. She actually seems to be doing excellent compared to even when we last saw her according to  our nursing staff. Both patient and her daughter are extremely encouraged at how well she is presenting at this point in time. Overall the biggest issue is still the right Achilles area which is being managed at this point in time by Dr. Wyn Quaker. Patient is currently utilizing a wound VAC to that region. 03/17/16; patient is at [facility] nursing home still. Using Aquacel Ag to the wounds on her bilateral feet and still Prisma to the small open area  on her abdomen. 03/31/16 at this point in time patient has been tolerating the dressing changes currently. She fortunately has no worsening of her symptoms although she tells me that the nurse who is caring for her at [facility] nursing facility decided that nothing was needed in regard to the lower abdominal wound from a dressing standpoint at this time. she is really not having significant discomfort or pain at this point she continues to have some tunneling in the proximal Achilles wound region. 04/07/16 patient continues to do well on evaluation today. Even the Achilles wound which has been the most tender is not giving her as much trouble. Unfortunately the PolyMem dressings that we utilize last week really did not seem to benefit her in particular. Obviously we will discontinue that at this point in time today. 04-15-16:Ms. Dehner is accompanied today by her daughter. She is still residing in an SNF undergoing dialysis, continues to receive antibiotics during dialysis as prescribed by Dr. Sampson Goon of infectious disease. She has a follow-up appointment with Dr. Sampson Goon on 04-17-16 to discuss the continuation of these antibiotics. Ms. Sasaki denies any pain to any of the 3 remaining wounds she does admit to changing to [employer] surgical shoes for safety while ambulating with physical therapy. She denies any falls since her last appointment here although she and her daughter do admit to increased tremors of unclear etiology since her last appointment. She has tolerated the dressing changes that were prescribed last week. 04-22-16 Ms. Coven presents today accompanied by her daughter for evaluation of her diabetic foot ulcers. She is still residing in an SNF, she continues dialysis 3 times weekly. She'll follow up with Dr. Sampson Goon last Friday, and at that appointment IV antibiotics were discontinued. According to Ms. Dubray and her daughter if there is any regression of her wounds Dr.  Sampson Goon will consider re-starting the antibiotics. Ms. Maclachlan daughter states that since the discontinuation of antibiotic therapy her "twitches" have resolved. 04/29/16; I have not seen this patient in quite some time however she is completed triple antibiotic therapy given at dialysis for osteomyelitis as prescribed by infectious disease. She is still being followed by Dr. dew of vascular surgery. We are using Hydrofera Blue to the surface of these wounds. She currently has 2 open areas a substantial area over her Achilles area although this is a lot better than the last time I saw this. She also has a small wound superiorly from this wound which has a superior probing depth of 2 cm. 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 My biopsy did not prove this nevertheless I think this is the correct clinical diagnosis. We are now following her for an area on the right Achilles part of her heel. This is not any different from last week. [name], [name] ([id number]) She also has  a small tunneling area just above this. And unfortunately this week there is been a reopening of an area where her right fourth toe was previously amputated. She is completed antibiotics 05/13/16; she has a new reopening on the mid abdominal wound in the same site is previously. This is a small open area. Apligraf #1 today to the areas on the right heel o2 still an open area in the fourth toe amputation site 05/27/16; the areas on her abdomen are completely closed over. Small open area from last time is closed. Apligraf #2 today to the areas on the right heel. The 4th toe amputation site is also heel 06/10/16; we did not have an Apligraf to put on today. This appeared tunneling wound on the right  posterior calf appears to be closed. The more substantial area on her Achilles itself is improved. 06/24/16; I reapplied her third Apligraf today although we have not had one debridement last time. The original tunneling wound is not as closed as last time. The more substantial area on her Achilles itself is improved with advancing epithelialization 07/08/16 Apligraf #4 today. The original long area over her Achilles as a healthy-looking area at the superior aspect and a small divot inferiorly. Both of the wound surface is look healthy 07/22/16; patient arrived today with a 2 original wounds on the Achilles part of her ankle too small for another application of Apligraf. This is on the right Achilles area. The small divot inferiorly looks as though it may have skin over most of this and I can't really see an open area. She did have an erythematous area over the medial malleolus with some drainage this looks like a rapid injury not cellulitis 07/29/16; erythematous area over the medial malleolus seems a lot better this week. This was a wrap injury. The areas on the right Achilles is still a small divot. The area superiorly down somewhat in size. Electronic Signature(s) Signed: 07/29/2016 3:56:26 PM By: Baltazar Najjar MD Entered By: Baltazar Najjar on 07/29/2016 13:16:51 [name] ([id number]) -------------------------------------------------------------------------------- Physical Exam Details Patient Name: [name] Date of Service: 07/29/2016 8:45 AM Medical Record Patient Account Number: [id number] Number: Treating RN: Phillis Haggis 1934/01/07 (82 [name] Other Clinician: Date of Birth/Sex: Female) Treating Jacquan Savas Primary Care Provider: [name] Provider/Extender: G Referring Provider: Charolotte Capuchin in Treatment: 32 Constitutional Sitting or standing Blood Pressure is within target range for patient.. Pulse regular and within target range for  patient.Marland Kitchen Respirations regular, non-labored and within target range.. Temperature is normal and within the target range for the patient.. Patient's appearance is neat and clean. Appears in no acute distress. Well nourished and well developed.. Notes Wound exam; the area on the Achilles continues to improve but requires a debridement with a #3 curette today of nonviable surface/necrotic tissue. These small divot inferiorly also requires debridement. This is a small area but was some depth posterior debridement I think the surface of it however is healthy. Electronic Signature(s) Signed: 07/29/2016 3:56:26 PM By: Baltazar Najjar MD Entered By: Baltazar Najjar on 07/29/2016 13:17:54 [name] ([id number]) -------------------------------------------------------------------------------- Physician Orders Details Patient Name: [name], [name]. Date of Service: 07/29/2016 8:45 AM Medical Record Patient Account Number: [id number] Number: Treating RN: Phillis Haggis 1934/01/08 (82 [name] Other Clinician: Date of Birth/Sex: Female) Treating Gottlieb Zuercher Primary Care Provider: [name] Provider/Extender: G Referring Provider: Charolotte Capuchin in Treatment: 33 Verbal / Phone Orders: Yes Clinician: Ashok Cordia, Debi Read Back and Verified: Yes Diagnosis Coding Wound  Cleansing Wound #9 Right Achilles o Clean wound with Normal Saline. o Cleanse wound with mild soap and water o May shower with protection. Anesthetic Wound #9 Right Achilles o Topical Lidocaine 4% cream applied to wound bed prior to debridement - all wounds for clinic use only Primary Wound Dressing Wound #9 Right Achilles o Prisma Ag - moisten with saline make sure you place a small piece in the bottom opening Secondary Dressing Wound #9 Right Achilles o ABD pad o Dry Gauze Dressing Change Frequency Wound #9 Right Achilles o Three times weekly - HHRN to change only the outside  dressing do not take off the steri-strips or the mepitel. Pt has a skin sub/graft on. Follow-up Appointments Wound #9 Right Achilles o Return Appointment in 1 week. Edema Control Wound #9 Right Achilles o Kerlix and Coban - Right Lower Extremity ELDORA, NAPP ([id number]) o Elevate legs to the level of the heart and pump ankles as often as possible Off-Loading Wound #9 Right Achilles o Other: - Float heels Electronic Signature(s) Signed: 07/29/2016 3:56:26 PM By: Baltazar Najjar MD Signed: 07/29/2016 4:23:48 PM By: Alejandro Mulling Entered By: Alejandro Mulling on 07/29/2016 09:15:29 [name] ([id number]) -------------------------------------------------------------------------------- Problem List Details Patient Name: [name], [name]. Date of Service: 07/29/2016 8:45 AM Medical Record Patient Account Number: [id number] Number: Treating RN: Phillis Haggis [birth year] (82 [name] Other Clinician: Date of Birth/Sex: Female) Treating Nanako Stopher Primary Care Provider: [name] Provider/Extender: G Referring Provider: Charolotte Capuchin in Treatment: 32 Active Problems ICD-10 Encounter Code Description Active Date Diagnosis E11.621 Type 2 diabetes mellitus with foot ulcer 12/17/2015 Yes L97.512 Non-pressure chronic ulcer of other part of right foot with 03/31/2016 Yes fat layer exposed E11.51 Type 2 diabetes mellitus with diabetic peripheral 12/17/2015 Yes angiopathy without gangrene Inactive Problems ICD-10 Code Description Active Date Inactive Date M86.671 Other chronic osteomyelitis, right ankle and foot 04/15/2016 04/15/2016 S31.104A Unspecified open wound of abdominal wall, left lower 12/17/2015 12/17/2015 quadrant without penetration into peritoneal cavity, initial encounter S31.103D Unspecified open wound of abdominal wall, right lower 04/15/2016 04/15/2016 quadrant without penetration into peritoneal cavity, subsequent  encounter Resolved Problems Electronic Signature(s) VALJEAN, RUPPEL ([id number]) Signed: 07/29/2016 3:56:26 PM By: Baltazar Najjar MD Entered By: Baltazar Najjar on 07/29/2016 13:15:19 [name] ([id number]) -------------------------------------------------------------------------------- Progress Note Details Patient Name: [name] Date of Service: 07/29/2016 8:45 AM Medical Record Patient Account Number: [id number] Number: Treating RN: Phillis Haggis 1933/08/16 (82 [name] Other Clinician: Date of Birth/Sex: Female) Treating Nivin Braniff Primary Care Provider: [name] Provider/Extender: G Referring Provider: Charolotte Capuchin in Treatment: 32 Subjective Chief Complaint Information obtained from Patient Ms. Cdebaca presents today for follow-up evaluation of her diabetic foot ulcers and abdominal wound. History of Present Illness (HPI) The following HPI elements were documented for the patient's wound: Location: right posterior heel, right Achilles, right lower quadrant abdomen, right fourth toe amp site Quality: denies pain to any wound Severity: not applicable Timing: denies pain 83 year old patient who is known to be diabetic, was referred to [place] by Dr. Gavin Potters for a right heel ulceration which she's had for a while. She was recently in hospital for a pneumonia and at that time and got delirious and was disoriented and sometime during this time developed a stage II ulcer on her right heel. Her past medical history is significant for bilateral pneumonia which was treated with injectable antibiotics and then to oral Levaquin which he has completed. She also has acute on chronic diastolic CHF, acute  on chronic respiratory failure, end-stage renal disease on hemodialysis, atrial fibrillation, recent stroke, diabetes mellitus. The patient and her son are poor historians but from what I understand she was admitted to the hospital with an acute  vascular compromise of her right lower extremity and Dr. Wyn Quaker has done a surgical procedure and we are trying to obtain these notes. There are also some vascular workup done and we will try and obtain these notes. the injury to the left lower quadrant of abdomen and the suprapubic area have been there due to a bruise and have been there for several months and no intervention has been done. 10/11/2015 -- on review of the [facility] records it was noted that the patient was admitted to the hospital on 09/14/2015 with peripheral vascular disease with claudication, end-stage renal disease, pressure ulcer, chronic atrial fibrillation. She was seen by Dr. Wyn Quaker who did her right lower extremity angiogram , angioplasty of the right anterior tibial artery and thrombolysis with TPA of the right popliteal artery, and thrombectomy. She was seen by Dr. Wyn Quaker during this past week and he was pleased with the progress. He did say that if he took her to the operating room for any procedure he would debride the abdominal wound under anesthesia. She was also seen by Dr. Ether Griffins the podiatrist who thought that she may lose her right fourth toe at some stage may need an amputation of this. ARRIETTY, [name] ([id number]) 10/21/2015 --patient known to Dr. Wyn Quaker and his last office visit from 10/04/2015 has been reviewed. 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 She also had a persistent ulceration of the right heel and markedly this area and a right third and fourth toe and a small scab on the calf but these are dry and seemed to be improving. Patient also has a left carotid endarterectomy and multiple interventions to a right brachiocephalic AV fistula. After the visit he had recommended noninvasive studies to recheck her revascularization. He was off the impression that she would likely lose the right  fourth toe and the third toe was likely to heal. He was concerned about underlying muscle necrosis on her right heel and midfoot. 11/01/2015 -- an echo done in January of this year showed her left ventricular ejection fraction to be about 50-55%. The patient was seen by the PA and Dr. Driscilla Grammes office and the plan was to take her to the operating room soon to have a debridement under anesthesia for the abdominal wall wound, the Achilles tendon on the right leg and amputation of the right fourth toe. The daughter and the patient do not feel that they would be able to undergo hyperbaric oxygen therapy 5 days a week for 6 weeks. 12/17/15; this is a medically complex woman who I note was recently in this clinic however I was not involved with her care. She returns today with multiple wounds; a) she has a wound in the mid abdomen that is been there since March of this year. I note that she is been to the overall for debridement recently. The exact etiology of this wound is not really clear b) left lower quadrant abdominal wound had some sanguinous drainage when she came in here. The patient fell in January and thinks this may have been secondary to a hematoma. c): The patient has 3 wounds on her right leg including a small wound on the right mid calf,  a large area over the Achilles which currently has a wound VAC for the last 6 weeks, also a smaller wound on the distal part of the right heel. As far as I understand most of these wounds are currently been dressed with's calcium alginate. According to her daughter the Achilles wound under the wound VAC is doing well d) the patient is had an amputation of her left fifth toe in January and the right fourth toe 6 weeks ago secondary to diabetic PAD e) the patient has chronic renal failure on dialysis for the last 2 years secondary to type 2 diabetes on insulin. The daughter's knowledge there is been no biopsy of the abdominal wounds given their  current appearance and lack of undefined etiology at have to wonder about calciphylaxis. 12/18/15:Addendum; I have reviewed [facility] I can see no relevant x-rays of the right heel. I note her arteriogram and revascularization of her right lower extremity in April 2017. She had debridement of both abdominal wounds and the right heel and Achilles wound on 11/07/15 at which time she had a right fourth toe ray amputation. The abdominal wounds were debridement again on 6/29. I do not see any relevant pathology of these abdominal wounds 12/24/15; culture I did of the drainage from the midline abdominal wound last week showed both Proteus and ampicillin sensitive enterococcus. I've given her a course of Augmentin adjusted on dialysis days. She has no specific complaints today. Been using Santyl to the abdominal wounds in the right leg wound and the wound VAC on the right Achilles which was initially prescribed by Dr. dew 12/31/15; I have done two punch biopsies of the large midline abdominal. 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 May have been a trauma component of the one on the left lower quadrant however the midline wound had no such history. She has a large area on the right Achilles heel with a wound VAC prescribed by Dr. dew. A small wound on the right anterior leg.Marland Kitchen UNFORTUNATELY she has 2 new wounds today. One on the left heel which is probably a pressure area. As well her previous amputation site of her right fourth toe has dehisced and now has a small wound with significant depth at the amputation site. 01/14/2016 -- she returns after 2 weeks and had had a punch biopsy of abdominal wound done the last visit [name], [name] ([id number]) -- had a biopsy of her midline abdominal wound done and the Pathology diagnosis is that of ulceration, necrosis and inflammation and negative for dysplasia and malignancy. 01/21/16. 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 In the meantime she has new wounds of the left heel the right fourth toe amputation site is opened up. The back is stopped to the right heel area. 01/28/16; the abdominal wounds continued to improve. The extensive wound on her right Achilles also looks stable except for the lower aspect of the wound where there is a large liquefied area that probes right down to her calcaneus. This cultured Proteus last week I have her on Augmentin and doxycycline 1. I think this is going to need a course of IV antibiotics and I will call dialysis. X-ray I did last week was negative, I think she is going to need an MRI 02/04/16; right heel MRI as before Saturday. Receiving I believe IV Rocephin at dialysis 02/11/16; as it turns out the patient could not have a MRI as she has a bladder stimulator  in place even though it is not currently in use since the beginning of this year. Although she has an allergy to IV contrast she apparently has done well with premedication so we will have to go for a CT scan with contrast. In the meantime she has had a fall now has a large skin tear on her left upper arm. She went to the ER and they suggested Tegaderm over topical antibiotics 03/03/16 currently patient returns after having been hospitalized for 2 weeks and subsequently transferred to [facility] nursing facility. She actually seems to be doing excellent compared to even when we last saw her according to our nursing staff. Both patient and her daughter are extremely encouraged at how well she is presenting at this point in time. Overall the biggest issue is still the right Achilles area which is being managed at this point in time by Dr. Wyn Quaker. Patient is currently utilizing a wound VAC to that region. 03/17/16; patient is at [facility] nursing home still. Using Aquacel Ag to the wounds on her bilateral feet and still Prisma to the small open area on her abdomen. 03/31/16 at this point in time  patient has been tolerating the dressing changes currently. She fortunately has no worsening of her symptoms although she tells me that the nurse who is caring for her at [facility] nursing facility decided that nothing was needed in regard to the lower abdominal wound from a dressing standpoint at this time. she is really not having significant discomfort or pain at this point she continues to have some tunneling in the proximal Achilles wound region. 04/07/16 patient continues to do well on evaluation today. Even the Achilles wound which has been the most tender is not giving her as much trouble. Unfortunately the PolyMem dressings that we utilize last week really did not seem to benefit her in particular. Obviously we will discontinue that at this point in time today. 04-15-16:Ms. Yurko is accompanied today by her daughter. She is still residing in an SNF undergoing dialysis, continues to receive antibiotics during dialysis as prescribed by Dr. Sampson Goon of infectious disease. She has a follow-up appointment with Dr. Sampson Goon on 04-17-16 to discuss the continuation of these antibiotics. Ms. Brissette denies any pain to any of the 3 remaining wounds she does admit to changing to [employer] surgical shoes for safety while ambulating with physical therapy. She denies any falls since her last appointment here although she and her daughter do admit to increased tremors of unclear etiology since her last appointment. She has tolerated the dressing changes that were prescribed last week. 04-22-16 Ms. Clute presents today accompanied by her daughter for evaluation of her diabetic foot ulcers. She is still residing in an SNF, she continues dialysis 3 times weekly. She'll follow up with Dr. Sampson Goon last Friday, and at that appointment IV antibiotics were discontinued. According to Ms. Ess and her daughter if there is any regression of her wounds Dr. Sampson Goon will consider re-starting  the antibiotics. Ms. Schooler daughter states that since the discontinuation of antibiotic therapy her "twitches" have resolved. 04/29/16; I have not seen this patient in quite some time however she is completed triple antibiotic therapy given at dialysis for osteomyelitis as prescribed by infectious disease. She is still being followed by Dr. Wyn Quaker [name] ([id number]) of vascular surgery. We are using Hydrofera Blue to the surface of these wounds. She currently has 2 open areas a substantial area over her Achilles area although this is a lot better than  the last time I saw this. She also has a small wound superiorly from this wound which has a superior probing depth of 2 cm. 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 My biopsy did not prove this nevertheless I think this is the correct clinical diagnosis. We are now following her for an area on the right Achilles part of her heel. This is not any different from last week. She also has a small tunneling area just above this. And unfortunately this week there is been a reopening of an area where her right fourth toe was previously amputated. She is completed antibiotics 05/13/16; she has a new reopening on the mid abdominal wound in the same site is previously. This is a small open area. Apligraf #1 today to the areas on the right heel o2 still an open area in the fourth toe amputation site 05/27/16; the areas on her abdomen are completely closed over. Small open area from last time is closed. Apligraf #2 today to the areas on the right heel. The 4th toe amputation site is also heel 06/10/16; we did not have an Apligraf to put on today. This appeared tunneling wound on the right posterior calf appears to be closed.  The more substantial area on her Achilles itself is improved. 06/24/16; I reapplied her third Apligraf today although we have not had one debridement last time. The original tunneling wound is not as closed as last time. The more substantial area on her Achilles itself is improved with advancing epithelialization 07/08/16 Apligraf #4 today. The original long area over her Achilles as a healthy-looking area at the superior aspect and a small divot inferiorly. Both of the wound surface is look healthy 07/22/16; patient arrived today with a 2 original wounds on the Achilles part of her ankle too small for another application of Apligraf. This is on the right Achilles area. The small divot inferiorly looks as though it may have skin over most of this and I can't really see an open area. She did have an erythematous area over the medial malleolus with some drainage this looks like a rapid injury not cellulitis 07/29/16; erythematous area over the medial malleolus seems a lot better this week. This was a wrap injury. The areas on the right Achilles is still a small divot. The area superiorly down somewhat in size. Objective Constitutional Sitting or standing Blood Pressure is within target range for patient.. Pulse regular and within target range for patient.Marland Kitchen Respirations regular, non-labored and within target range.. Temperature is normal and within the target range for the patient.. Patient's appearance is neat and clean. Appears in no acute distress. Well nourished and well developed.. Vitals Time Taken: 8:49 AM, Height: 65 in, Weight: 160 lbs, BMI: 26.6, Pulse: 73 bpm, Respiratory Rate: 16 breaths/min, Blood Pressure: 131/69 mmHg. General Notes: Wound exam; the area on the Achilles continues to improve but requires a debridement with a #3 curette today of nonviable surface/necrotic tissue. These small divot inferiorly also requires debridement. This is a small area but was some depth posterior  debridement I think the surface of it [name], [name] ([id number]) however is healthy. Integumentary (Hair, Skin) Wound #19 status is Healed - Epithelialized. Original cause of wound was Gradually Appeared. The wound is located on the Right,Lateral Malleolus. The wound  measures 0cm length x 0cm width x 0cm depth; 0cm^2 area and 0cm^3 volume. There is no tunneling or undermining noted. There is a none present amount of drainage noted. The wound margin is flat and intact. There is no granulation within the wound bed. There is no necrotic tissue within the wound bed. Wound #9 status is Open. Original cause of wound was Gradually Appeared. The wound is located on the Right Achilles. The wound measures 2.4cm length x 0.7cm width x 0.2cm depth; 1.319cm^2 area and 0.264cm^3 volume. There is no tunneling or undermining noted. There is a large amount of serosanguineous drainage noted. The wound margin is distinct with the outline attached to the wound base. There is large (67-100%) red, pink granulation within the wound bed. There is a small (1-33%) amount of necrotic tissue within the wound bed including Adherent Slough. Periwound temperature was noted as No Abnormality. The periwound has tenderness on palpation. 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 There was a Skin/Subcutaneous Tissue Debridement ([id number]) debridement with total area of 1.68 sq cm performed by Maxwell Caul, MD. with the following instrument(s): Curette to remove Viable and Non-Viable tissue/material including Exudate, Fibrin/Slough, and Subcutaneous after achieving pain control using  Lidocaine 4% Topical Solution. A time out was conducted at 09:11, prior to the start of the procedure. A Minimum amount of bleeding was controlled with Pressure. The procedure was tolerated well with a pain level of 0 throughout and a pain level of 0 following the procedure. Post Debridement Measurements: 2.4cm length x 0.7cm width x 0.2cm depth; 0.264cm^3 volume. Character of Wound/Ulcer Post Debridement requires further debridement. Severity of Tissue Post Debridement is: Fat layer exposed. Post procedure Diagnosis Wound #9: Same as Pre-Procedure [name], [name] ([id number]) Plan Wound Cleansing: Wound #9 Right Achilles: Clean wound with Normal Saline. Cleanse wound with mild soap and water May shower with protection. Anesthetic: Wound #9 Right Achilles: Topical Lidocaine 4% cream applied to wound bed prior to debridement - all wounds for clinic use only Primary Wound Dressing: Wound #9 Right Achilles: Prisma Ag - moisten with saline make sure you place a small piece in the bottom opening Secondary Dressing: Wound #9 Right Achilles: ABD pad Dry Gauze Dressing Change Frequency: Wound #9 Right Achilles: Three times weekly - HHRN to change only the outside dressing do not take off the steri-strips or the mepitel. Pt has a skin sub/graft on. Follow-up Appointments: Wound #9 Right Achilles: Return Appointment in 1 week. Edema Control: Wound #9 Right Achilles: Kerlix and Coban - Right Lower Extremity Elevate legs to the level of the heart and pump ankles as often as possible Off-Loading: Wound #9 Right Achilles: Other: - Float heels silver collagen,aBD,Kerlix and coban.o Electronic Signature(s) Signed: 07/29/2016 3:56:26 PM By: Baltazar Najjar MD Entered By: Baltazar Najjar on 07/29/2016 13:18:55 LOANN, CHAHAL ([id number]) MAELY, CLEMENTS ([id number]) -------------------------------------------------------------------------------- SuperBill Details Patient Name: [name] Date of Service: 07/29/2016 Medical Record Patient Account Number: [id number] Number: Treating RN: Phillis Haggis December 03, 1933 (82 [name] Other Clinician: Date of Birth/Sex: Female) Treating Hayk Divis Primary Care Provider: [name] Provider/Extender: G Referring Provider: [name] Service Line: Outpatient Weeks in Treatment: 32 Diagnosis Coding ICD-10 Codes  Code Description E11.621 Type 2 diabetes mellitus with foot ulcer L97.512 Non-pressure chronic ulcer of other part of right foot with fat layer exposed E11.51 Type 2 diabetes mellitus with diabetic peripheral angiopathy without gangrene Facility Procedures CPT4 Code Description: [id number] 11042 - DEB SUBQ TISSUE 20 SQ CM/< ICD-10 Description Diagnosis E11.621 Type 2 diabetes mellitus with foot ulcer L97.512 Non-pressure chronic ulcer of other part of right fo Modifier: ot with fat la Quantity: 1 [name] exposed Physician Procedures CPT4 Code Description: [id number] 11042 - WC PHYS SUBQ TISS 20 SQ CM ICD-10 Description Diagnosis E11.621 Type 2 diabetes mellitus with foot ulcer L97.512 Non-pressure chronic ulcer of other part of right fo Modifier: ot with fat lay Quantity: 1 er exposed Electronic Signature(s) Signed: 07/29/2016 3:56:26 PM By: Baltazar Najjar MD Entered By: Baltazar Najjar on 07/29/2016 13:19:30

## 2016-07-30 NOTE — Progress Notes (Signed)
MONEKA, MCQUINN ([id number]) Visit Report for 07/29/2016 Arrival Information Details Patient Name: [name], [name]. Date of Service: 07/29/2016 8:45 AM Medical Record Patient Account Number: [id number] Number: Treating RN: Phillis Haggis [birth year] (82 y.o. Other Clinician: Date of Birth/Sex: Female) Treating ROBSON, MICHAEL Primary Care Melvin Marmo: [name] Brei Pociask/Extender: G Referring Yannis Broce: Charolotte Capuchin in Treatment: 32 Visit Information History Since Last Visit All ordered tests and consults were completed: No Patient Arrived: Wheel Chair Added or deleted any medications: No Arrival Time: 08:45 Any new allergies or adverse reactions: No Accompanied By: daughter Had a fall or experienced change in No Transfer Assistance: EasyPivot Patient activities of daily living that may affect Lift risk of falls: Patient Identification Verified: Yes Signs or symptoms of abuse/neglect since last No Secondary Verification Process Yes visito Completed: Hospitalized since last visit: No Patient Requires Transmission- No Has Dressing in Place as Prescribed: Yes Based Precautions: Pain Present Now: No Patient Has Alerts: Yes Patient Alerts: Patient on Blood Thinner DMII Warfarin ABI Floris Bilateral NO BP RIGHT ARM Electronic Signature(s) Signed: 07/29/2016 4:23:48 PM By: Alejandro Mulling Entered By: Alejandro Mulling on 07/29/2016 08:46:45 [name] ([id number]) -------------------------------------------------------------------------------- Encounter Discharge Information Details Patient Name: [name]. Date of Service: 07/29/2016 8:45 AM Medical Record Patient Account Number: [id number] Number: Treating RN: Phillis Haggis [birth year] (82 y.o. Other Clinician: Date of Birth/Sex: Female) Treating ROBSON, MICHAEL Primary Care Charell Faulk: [name] Mahika Vanvoorhis/Extender: G Referring Eartha Vonbehren: Charolotte Capuchin in Treatment:  27 Encounter Discharge Information Items Discharge Pain Level: 0 Discharge Condition: Stable Ambulatory Status: Wheelchair Other (Note Discharge Destination: Required) Transportation: Private Auto Accompanied By: daughter Schedule Follow-up Appointment: Yes Medication Reconciliation completed and provided to Patient/Care No Lashan Macias: Provided on Clinical Summary of Care: 07/29/2016 Form Type Recipient Paper Patient [facility] Electronic Signature(s) Signed: 07/29/2016 9:26:09 AM By: Gwenlyn Perking Entered By: Gwenlyn Perking on 07/29/2016 09:26:09 [name] ([id number]) -------------------------------------------------------------------------------- Lower Extremity Assessment Details Patient Name: [name] Date of Service: 07/29/2016 8:45 AM Medical Record Patient Account Number: [id number] Number: Treating RN: Phillis Haggis [birth year] (82 y.o. Other Clinician: Date of Birth/Sex: Female) Treating ROBSON, MICHAEL Primary Care Jeriah Corkum: [name] Javonni Macke/Extender: G Referring Ica Daye: Charolotte Capuchin in Treatment: 32 Vascular Assessment Pulses: Dorsalis Pedis Palpable: [Right:Yes] Posterior Tibial Extremity colors, hair growth, and conditions: Extremity Color: [Right:Hyperpigmented] Temperature of Extremity: [Right:Warm] Capillary Refill: [Right:< 3 seconds] Electronic Signature(s) Signed: 07/29/2016 4:23:48 PM By: Alejandro Mulling Entered By: Alejandro Mulling on 07/29/2016 08:58:51 [name] ([id number]) -------------------------------------------------------------------------------- Multi Wound Chart Details Patient Name: [name] Date of Service: 07/29/2016 8:45 AM Medical Record Patient Account Number: [id number] Number: Treating RN: Phillis Haggis Oct 23, 1933 (82 y.o. 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 ([id number]) Exudate Amount: None Present Large N/A Exudate Type: N/A Serosanguineous N/A Exudate Color: N/A red, brown N/A Wound Margin: Flat and Intact Distinct, outline attached N/A Granulation Amount: None Present (0%) Large (67-100%) N/A Granulation Quality: N/A Red, Pink N/A Necrotic Amount: None Present (0%) Small (1-33%) N/A Epithelialization: Large (67-100%) None N/A Debridement: N/A Debridement ([id number] N/A [id number]) Pre-procedure N/A 09:11 N/A Verification/Time Out Taken: Pain Control: N/A Lidocaine 4% Topical N/A Solution Tissue Debrided: N/A Fibrin/Slough, Exudates, N/A Subcutaneous Level: N/A Skin/Subcutaneous N/A Tissue Debridement Area (sq N/A 1.68 N/A cm): Instrument: N/A Curette N/A Bleeding: N/A Minimum N/A Hemostasis Achieved: N/A Pressure N/A Procedural Pain: N/A 0 N/A Post  Procedural Pain: N/A 0 N/A Debridement Treatment N/A Procedure was tolerated N/A Response: well Post Debridement N/A 2.4x0.7x0.2 N/A Measurements L x W x D (cm) Post Debridement N/A 0.264 N/A Volume: (cm) Periwound Skin Texture: No Abnormalities Noted No Abnormalities Noted N/A Periwound Skin No Abnormalities Noted No Abnormalities Noted N/A Moisture: Periwound Skin Color: No Abnormalities Noted No Abnormalities Noted N/A Temperature: N/A No Abnormality N/A Tenderness on No Yes N/A Palpation: Wound Preparation: Ulcer Cleansing: Ulcer Cleansing: N/A Rinsed/Irrigated with Rinsed/Irrigated with Saline Saline, Other: soap and water Topical Anesthetic Applied: Other: lidocaine 4% Procedures Performed: N/A Debridement N/A Natalie RimaHOPKINS, Masayo Y. ([id number]) Treatment Notes Wound #9 (Right Achilles) 1. Cleansed with: Clean wound with Normal Saline 2. Anesthetic Topical Lidocaine 4% cream to wound bed prior to debridement 4. Dressing Applied: Prisma Ag 5. Secondary Dressing Applied ABD Pad Dry Gauze Notes kerlix, coban Electronic Signature(s) Signed: 07/29/2016 3:56:26 PM By: Baltazar Najjarobson, Michael MD Entered By: Baltazar Najjarobson, Michael on 07/29/2016 13:15:39 Abbe AmsterdamHOPKINS, Vinetta BergamoJOANN Y. ([id number]) -------------------------------------------------------------------------------- Multi-Disciplinary Care Plan Details Patient Name: Natalie RimaHOPKINS, Kimba Y. Date of Service: 07/29/2016 8:45 AM Medical Record Patient Account Number: [id number] Number: Treating RN: Phillis Haggisinkerton, Debi 03-Mar-1934 (82 y.o. Other Clinician: Date of Birth/Sex: Female) Treating ROBSON, MICHAEL Primary Care Avett Reineck: Rolm GalaGrandis, Heidi Herman Mell/Extender: G Referring Basir Niven: Charolotte CapuchinGrandis, Heidi Weeks in Treatment: 1932 Active Inactive ` Abuse / Safety / Falls / Self Care Management Nursing Diagnoses: Impaired physical mobility Potential for falls Goals: Patient will remain injury free Date Initiated: 12/17/2015 Target Resolution  Date: 08/29/2016 Goal Status: Active Interventions: Assess fall risk on admission and as needed Notes: ` Necrotic Tissue Nursing Diagnoses: Impaired tissue integrity related to necrotic/devitalized tissue Knowledge deficit related to management of necrotic/devitalized tissue Goals: Necrotic/devitalized tissue will be minimized in the wound bed Date Initiated: 02/04/2016 Target Resolution Date: 08/29/2016 Goal Status: Active Interventions: Assess patient pain level pre-, during and post procedure and prior to discharge Provide education on necrotic tissue and debridement process Treatment Activities: Apply topical anesthetic as ordered : 02/04/2016 Natalie RimaHOPKINS, Siena Y. ([id number]) Notes: ` Wound/Skin Impairment Nursing Diagnoses: Impaired tissue integrity Goals: Patient/caregiver will verbalize understanding of skin care regimen Date Initiated: 12/17/2015 Target Resolution Date: 08/29/2016 Goal Status: Active Ulcer/skin breakdown will have a volume reduction of 30% by week 4 Date Initiated: 12/17/2015 Target Resolution Date: 08/29/2016 Goal Status: Active Ulcer/skin breakdown will have a volume reduction of 50% by week 8 Date Initiated: 12/17/2015 Target Resolution Date: 08/29/2016 Goal Status: Active Ulcer/skin breakdown will have a volume reduction of 80% by week 12 Date Initiated: 12/17/2015 Target Resolution Date: 08/29/2016 Goal Status: Active Ulcer/skin breakdown will heal within 14 weeks Date Initiated: 12/17/2015 Target  Resolution Date: 08/29/2016 Goal Status: Active Interventions: Assess patient/caregiver ability to obtain necessary supplies Assess patient/caregiver ability to perform ulcer/skin care regimen upon admission and as needed Assess ulceration(s) every visit Notes: Electronic Signature(s) Signed: 07/29/2016 4:23:48 PM By: Alejandro Mulling Entered By: Alejandro Mulling on 07/29/2016 08:58:56 [name]  ([id number]) -------------------------------------------------------------------------------- Pain Assessment Details Patient Name: [name]. Date of Service: 07/29/2016 8:45 AM Medical Record Patient Account Number: [id number] Number: Treating RN: Phillis Haggis Dec 31, 1933 (82 y.o. Other Clinician: Date of Birth/Sex: Female) Treating ROBSON, MICHAEL Primary Care Jolissa Kapral: [name] Minnah Llamas/Extender: G Referring Allisyn Kunz: Charolotte Capuchin in Treatment: 32 Active Problems Location of Pain Severity and Description of Pain Patient Has Paino No Site Locations With Dressing Change: No Pain Management and Medication Current Pain Management: Electronic Signature(s) Signed: 07/29/2016 4:23:48 PM By: Alejandro Mulling Entered By: Alejandro Mulling on 07/29/2016 08:47:04 [name] ([id number]) -------------------------------------------------------------------------------- Patient/Caregiver Education Details Patient Name: [name]. Date of Service: 07/29/2016 8:45 AM Medical Record Patient Account Number: [id number] Number: Treating RN: Phillis Haggis January 26, 1934 (82 y.o. Other Clinician: Date of Birth/Gender: Female) Treating ROBSON, MICHAEL Primary Care Physician: [name] Physician/Extender: G Referring Physician: Charolotte Capuchin in Treatment: 9 Education Assessment Education Provided To: Patient Education Topics Provided Wound/Skin Impairment: Handouts: Other: change dressing as ordered Methods: Demonstration, Explain/Verbal Responses: State content correctly Electronic Signature(s) Signed: 07/29/2016 4:23:48 PM By: Alejandro Mulling Entered By: Alejandro Mulling on 07/29/2016 08:59:38 [name] ([id number]) -------------------------------------------------------------------------------- Wound Assessment Details Patient Name: [name] Date of Service: 07/29/2016 8:45 AM Medical Record Patient  Account Number: [id number] Number: Treating RN: Phillis Haggis June 18, 1933 (82 y.o. Other Clinician: Date of Birth/Sex: Female) Treating ROBSON, MICHAEL Primary Care Louise Victory: [name] Susen Haskew/Extender: G Referring Ceniyah Thorp: Charolotte Capuchin in Treatment: 32 Wound Status Wound Number: 19 Primary Diabetic Wound/Ulcer of the Lower Etiology: Extremity Wound Location: Right Malleolus - Lateral Wound Healed - Epithelialized Wounding Event: Gradually Appeared Status: Date Acquired: 07/22/2016 Comorbid Arrhythmia, Congestive Heart Failure, Weeks Of Treatment: 1 History: Hypertension, Type II Diabetes Clustered Wound: No Photos Photo Uploaded By: Alejandro Mulling on 07/29/2016 10:06:06 Wound Measurements Length: (cm) 0 % Reduction i Width: (cm) 0 % Reduction i Depth: (cm) 0 Epithelializa Area: (cm) 0 Tunneling: Volume: (cm) 0 Undermining: n Area: 100% n Volume: 100% tion: Large (67-100%) No No Wound Description Classification: Grade 1 Wound Margin: Flat and Intact Exudate Amount: None Present Foul Odor After Cleansing: No Slough/Fibrino No Wound Bed Granulation Amount: None Present (0%) Necrotic Amount: None Present (0%) Periwound Skin Texture Texture Color VONNE, MCDANEL ([id number]) No Abnormalities Noted: No No Abnormalities Noted: No Moisture No Abnormalities Noted: No Wound Preparation Ulcer Cleansing: Rinsed/Irrigated with Saline Electronic Signature(s) Signed: 07/29/2016 4:23:48 PM By: Alejandro Mulling Entered By: Alejandro Mulling on 07/29/2016 09:13:00 [name] ([id number]) -------------------------------------------------------------------------------- Wound Assessment Details Patient Name: [name] Date of Service: 07/29/2016 8:45 AM Medical Record Patient Account Number: [id number] Number: Treating RN: Phillis Haggis [birth year] (82 y.o. Other Clinician: Date of Birth/Sex: Female) Treating ROBSON,  MICHAEL Primary Care Damarco Keysor: [name] Adilee Lemme/Extender: G Referring Brasen Bundren: Charolotte Capuchin in Treatment: 32 Wound Status Wound Number: 9 Primary Diabetic Wound/Ulcer of the Lower Etiology: Extremity Wound Location: Right Achilles Secondary Arterial Insufficiency Ulcer Wounding Event: Gradually Appeared Etiology: Date Acquired: 06/02/2015 Wound Open Weeks Of Treatment: 32 Status: Clustered Wound: No Comorbid Arrhythmia, Congestive Heart Pending Amputation On Presentation History: Failure, Hypertension, Type II Diabetes Photos Photo Uploaded By: Alejandro Mulling on 07/29/2016 10:06:07  Wound Measurements Length: (cm) 2.4 Width: (cm) 0.7 Depth: (cm) 0.2 Area: (cm) 1.319 Volume: (cm) 0.264 % Reduction in Area: 96% % Reduction in Volume: 97.3% Epithelialization: None Tunneling: No Undermining: No Wound Description Classification: Grade 2 Foul Odor Afte Wound Margin: Distinct, outline attached Exudate Amount: Large Exudate Type: Serosanguineous Exudate Color: red, brown r Cleansing: No Wound Bed LUCI, BELLUCCI ([id number]) Granulation Amount: Large (67-100%) Granulation Quality: Red, Pink Necrotic Amount: Small (1-33%) Necrotic Quality: Adherent Slough Periwound Skin Texture Texture Color No Abnormalities Noted: No No Abnormalities Noted: No Moisture Temperature / Pain No Abnormalities Noted: No Temperature: No Abnormality Tenderness on Palpation: Yes Wound Preparation Ulcer Cleansing: Rinsed/Irrigated with Saline, Other: soap and water, Topical Anesthetic Applied: Other: lidocaine 4%, Treatment Notes Wound #9 (Right Achilles) 1. Cleansed with: Clean wound with Normal Saline 2. Anesthetic Topical Lidocaine 4% cream to wound bed prior to debridement 4. Dressing Applied: Prisma Ag 5. Secondary Dressing Applied ABD Pad Dry Gauze Notes kerlix, coban Electronic Signature(s) Signed: 07/29/2016 4:23:48 PM By: Alejandro Mulling Entered  By: Alejandro Mulling on 07/29/2016 08:55:16 [name] ([id number]) -------------------------------------------------------------------------------- Vitals Details Patient Name: [name] Date of Service: 07/29/2016 8:45 AM Medical Record Patient Account Number: [id number] Number: Treating RN: Phillis Haggis 1933/07/04 (82 y.o. Other Clinician: Date of Birth/Sex: Female) Treating ROBSON, MICHAEL Primary Care Habib Kise: [name] Kamala Kolton/Extender: G Referring Aaban Griep: Charolotte Capuchin in Treatment: 32 Vital Signs Time Taken: 08:49 Pulse (bpm): 73 Height (in): 65 Respiratory Rate (breaths/min): 16 Weight (lbs): 160 Blood Pressure (mmHg): 131/69 Body Mass Index (BMI): 26.6 Reference Range: 80 - 120 mg / dl Electronic Signature(s) Signed: 07/29/2016 4:23:48 PM By: Alejandro Mulling Entered By: Alejandro Mulling on 07/29/2016 08:49:46

## 2016-08-05 ENCOUNTER — Encounter: Payer: No Typology Code available for payment source | Attending: Internal Medicine | Admitting: Internal Medicine

## 2016-08-05 DIAGNOSIS — Z8673 Personal history of transient ischemic attack (TIA), and cerebral infarction without residual deficits: Secondary | ICD-10-CM | POA: Diagnosis not present

## 2016-08-05 DIAGNOSIS — Z992 Dependence on renal dialysis: Secondary | ICD-10-CM | POA: Insufficient documentation

## 2016-08-05 DIAGNOSIS — I5033 Acute on chronic diastolic (congestive) heart failure: Secondary | ICD-10-CM | POA: Diagnosis not present

## 2016-08-05 DIAGNOSIS — Z794 Long term (current) use of insulin: Secondary | ICD-10-CM | POA: Insufficient documentation

## 2016-08-05 DIAGNOSIS — E11621 Type 2 diabetes mellitus with foot ulcer: Secondary | ICD-10-CM | POA: Diagnosis present

## 2016-08-05 DIAGNOSIS — I482 Chronic atrial fibrillation: Secondary | ICD-10-CM | POA: Insufficient documentation

## 2016-08-05 DIAGNOSIS — N186 End stage renal disease: Secondary | ICD-10-CM | POA: Insufficient documentation

## 2016-08-05 DIAGNOSIS — L97512 Non-pressure chronic ulcer of other part of right foot with fat layer exposed: Secondary | ICD-10-CM | POA: Insufficient documentation

## 2016-08-05 DIAGNOSIS — E1122 Type 2 diabetes mellitus with diabetic chronic kidney disease: Secondary | ICD-10-CM | POA: Insufficient documentation

## 2016-08-05 DIAGNOSIS — E1151 Type 2 diabetes mellitus with diabetic peripheral angiopathy without gangrene: Secondary | ICD-10-CM | POA: Insufficient documentation

## 2016-08-05 DIAGNOSIS — I132 Hypertensive heart and chronic kidney disease with heart failure and with stage 5 chronic kidney disease, or end stage renal disease: Secondary | ICD-10-CM | POA: Diagnosis not present

## 2016-08-07 NOTE — Progress Notes (Signed)
[name], [name] ([id number]) Visit Report for 08/05/2016 Arrival Information Details Patient Name: [name], [name]. Date of Service: 08/05/2016 8:00 AM Medical Record Patient Account Number: [id number] Number: Treating RN: Phillis Haggis [birth year] (82 [name] Other Clinician: Date of Birth/Sex: Female) Treating ROBSON, MICHAEL Primary Care Duanna Runk: [name] Artavis Cowie/Extender: G Referring Marena Witts: Charolotte Capuchin in Treatment: 102 Visit Information History Since Last Visit All ordered tests and consults were completed: No Patient Arrived: Wheel Chair Added or deleted any medications: No Arrival Time: 08:23 Any new allergies or adverse reactions: No Accompanied By: daughter Had a fall or experienced change in No Transfer Assistance: EasyPivot Patient activities of daily living that may affect Lift risk of falls: Patient Identification Verified: Yes Signs or symptoms of abuse/neglect since last No Secondary Verification Process Yes visito Completed: Hospitalized since last visit: No Patient Requires Transmission- No Has Dressing in Place as Prescribed: Yes Based Precautions: Pain Present Now: No Patient Has Alerts: Yes Patient Alerts: Patient on Blood Thinner DMII Warfarin ABI [place] Bilateral NO BP RIGHT ARM Electronic Signature(s) Signed: 08/05/2016 4:45:11 PM By: Alejandro Mulling Entered By: Alejandro Mulling on 08/05/2016 08:24:08 [name] ([id number]) -------------------------------------------------------------------------------- Clinic Level of Care Assessment Details Patient Name: [name] Date of Service: 08/05/2016 8:00 AM Medical Record Patient Account Number: [id number] Number: Treating RN: Phillis Haggis 1933-12-16 (82 [name] Other Clinician: Date of Birth/Sex: Female) Treating ROBSON, MICHAEL Primary Care Bentlee [place]: [name] Gabryella Murfin/Extender: G Referring Charene Mccallister: Charolotte Capuchin in Treatment:  54 Clinic Level of Care Assessment Items TOOL 4 Quantity Score X - Use when only an EandM is performed on FOLLOW-UP visit 1 0 ASSESSMENTS - Nursing Assessment / Reassessment X - Reassessment of Co-morbidities (includes updates in patient status) 1 10 X - Reassessment of Adherence to Treatment Plan 1 5 ASSESSMENTS - Wound and Skin Assessment / Reassessment X - Simple Wound Assessment / Reassessment - one wound 1 5 []  - Complex Wound Assessment / Reassessment - multiple wounds 0 []  - Dermatologic / Skin Assessment (not related to wound area) 0 ASSESSMENTS - Focused Assessment []  - Circumferential Edema Measurements - multi extremities 0 []  - Nutritional Assessment / Counseling / Intervention 0 []  - Lower Extremity Assessment (monofilament, tuning fork, pulses) 0 []  - Peripheral Arterial Disease Assessment (using hand held doppler) 0 ASSESSMENTS - Ostomy and/or Continence Assessment and Care []  - Incontinence Assessment and Management 0 []  - Ostomy Care Assessment and Management (repouching, etc.) 0 PROCESS - Coordination of Care []  - Simple Patient / Family Education for ongoing care 0 X - Complex (extensive) Patient / Family Education for ongoing care 1 20 X - Staff obtains Chiropractor, Records, Test Results / Process Orders 1 10 X - Staff telephones HHA, Nursing Homes / Clarify orders / etc 1 10 Heckman, [name] ([id number]) []  - Routine Transfer to another Facility (non-emergent condition) 0 []  - Routine Hospital Admission (non-emergent condition) 0 []  - New Admissions / Manufacturing engineer / Ordering NPWT, Apligraf, etc. 0 []  - Emergency Hospital Admission (emergent condition) 0 X - Simple Discharge Coordination 1 10 []  - Complex (extensive) Discharge Coordination 0 PROCESS - Special Needs []  - Pediatric / Minor Patient Management 0 []  - Isolation Patient Management 0 []  - Hearing / Language / Visual special needs 0 []  - Assessment of Community assistance (transportation, D/C  planning, etc.) 0 []  - Additional assistance / Altered mentation 0 []  - Support Surface(s) Assessment (bed, cushion, seat, etc.) 0 INTERVENTIONS - Wound Cleansing / Measurement X -  Simple Wound Cleansing - one wound 1 5 []  - Complex Wound Cleansing - multiple wounds 0 X - Wound Imaging (photographs - any number of wounds) 1 5 []  - Wound Tracing (instead of photographs) 0 X - Simple Wound Measurement - one wound 1 5 []  - Complex Wound Measurement - multiple wounds 0 INTERVENTIONS - Wound Dressings []  - Small Wound Dressing one or multiple wounds 0 X - Medium Wound Dressing one or multiple wounds 1 15 []  - Large Wound Dressing one or multiple wounds 0 X - Application of Medications - topical 1 5 []  - Application of Medications - injection 0 [name], [name] ([id number]) INTERVENTIONS - Miscellaneous []  - External ear exam 0 []  - Specimen Collection (cultures, biopsies, blood, body fluids, etc.) 0 []  - Specimen(s) / Culture(s) sent or taken to Lab for analysis 0 []  - Patient Transfer (multiple staff / Michiel Sites Lift / Similar devices) 0 []  - Simple Staple / Suture removal (25 or less) 0 []  - Complex Staple / Suture removal (26 or more) 0 []  - Hypo / Hyperglycemic Management (close monitor of Blood Glucose) 0 []  - Ankle / Brachial Index (ABI) - do not check if billed separately 0 X - Vital Signs 1 5 Has the patient been seen at the hospital within the last three years: Yes Total Score: 110 Level Of Care: New/Established - Level 3 Electronic Signature(s) Signed: 08/05/2016 4:45:11 PM By: Alejandro Mulling Entered By: Alejandro Mulling on 08/05/2016 09:52:00 [name] ([id number]) -------------------------------------------------------------------------------- Encounter Discharge Information Details Patient Name: [name] Date of Service: 08/05/2016 8:00 AM Medical Record Patient Account Number: [id number] Number: Treating RN: Phillis Haggis [birth year] (82 [name]  Other Clinician: Date of Birth/Sex: Female) Treating ROBSON, MICHAEL Primary Care Kashia Brossard: [name] Jannifer Fischler/Extender: G Referring Srihitha Tagliaferri: Charolotte Capuchin in Treatment: 56 Encounter Discharge Information Items Discharge Pain Level: 0 Discharge Condition: Stable Ambulatory Status: Wheelchair Discharge Destination: Home Transportation: Private Auto Accompanied By: daughter Schedule Follow-up Appointment: Yes Medication Reconciliation completed No and provided to Patient/Care Reagann Dolce: Provided on Clinical Summary of Care: 08/05/2016 Form Type Recipient Paper Patient [facility] Electronic Signature(s) Signed: 08/05/2016 8:47:00 AM By: Gwenlyn Perking Entered By: Gwenlyn Perking on 08/05/2016 08:46:59 [name] ([id number]) -------------------------------------------------------------------------------- Lower Extremity Assessment Details Patient Name: [name] Date of Service: 08/05/2016 8:00 AM Medical Record Patient Account Number: [id number] Number: Treating RN: Phillis Haggis June 11, 1933 (82 [name] Other Clinician: Date of Birth/Sex: Female) Treating ROBSON, MICHAEL Primary Care Jatavia Keltner: [name] Latashia Koch/Extender: G Referring Tatijana Bierly: Charolotte Capuchin in Treatment: 33 Vascular Assessment Pulses: Dorsalis Pedis Palpable: [Right:Yes] Posterior Tibial Extremity colors, hair growth, and conditions: Extremity Color: [Right:Hyperpigmented] Temperature of Extremity: [Right:Warm] Capillary Refill: [Right:< 3 seconds] Electronic Signature(s) Signed: 08/05/2016 4:45:11 PM By: Alejandro Mulling Entered By: Alejandro Mulling on 08/05/2016 08:31:33 [name] ([id number]) -------------------------------------------------------------------------------- Multi Wound Chart Details Patient Name: [name] Date of Service: 08/05/2016 8:00 AM Medical Record Patient Account Number: [id number] Number: Treating RN: Phillis Haggis [birth year] (82 [name] 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] Comorbid History: [9:Arrhythmia, Congestive N/A Heart Failure, Hypertension, Type II  Diabetes] Date Acquired: [9:06/02/2015] [N/A:N/A] Weeks of Treatment: [9:33] [N/A:N/A] Wound Status: [9:Open] [N/A:N/A] Pending Amputation on Yes [N/A:N/A] Presentation: Measurements L x W x D 4x0.8x0.5 [N/A:N/A] (cm) Area (cm) : [9:2.513] [N/A:N/A] Volume (cm) : [9:1.257] [N/A:N/A] % Reduction in Area: [9:92.30%] [N/A:N/A] % Reduction in Volume: 87.10% [N/A:N/A] Classification: [9:Grade 2] [N/A:N/A] Exudate Amount: [9:Large] [N/A:N/A] Exudate Type: [9:Serous] [N/A:N/A] Exudate Color: [9:amber] [N/A:N/A] Wound Margin: [9:Distinct, outline attached N/A] Granulation Amount: [9:Medium (34-66%)] [N/A:N/A] Granulation Quality: [9:Red, Pink] [N/A:N/A] Necrotic Amount: Medium (34-66%) N/A N/A Epithelialization: None N/A N/A Periwound Skin Texture: No Abnormalities Noted N/A N/A Periwound Skin No Abnormalities Noted N/A N/A Moisture: Periwound Skin Color: No Abnormalities Noted N/A N/A Temperature: No Abnormality N/A N/A Tenderness on Yes N/A N/A Palpation: Wound Preparation: Ulcer Cleansing: N/A N/A Rinsed/Irrigated with Saline, Other: soap and water Topical Anesthetic Applied: Other: lidocaine 4% Treatment Notes Wound #9 (Right Achilles) 1. Cleansed  with: Clean wound with Normal Saline 2. Anesthetic Topical Lidocaine 4% cream to wound bed prior to debridement 4. Dressing Applied: Prisma Ag 5. Secondary Dressing Applied ABD Pad 7. Secured with Tape Notes kerlix, coban Electronic Signature(s) Signed: 08/06/2016 5:01:50 PM By: Baltazar Najjarobson, Michael MD Entered By: Baltazar Najjarobson, Michael on 08/05/2016 08:49:57 [name], Natalie Y. ([id number]) -------------------------------------------------------------------------------- Multi-Disciplinary Care Plan Details Patient Name: [name], Natalie Y. Date of Service: 08/05/2016 8:00 AM Medical Record Patient Account Number: [id number] Number: Treating RN: Phillis Haggisinkerton, Debi 1933/08/04 (82 [name] Other Clinician: Date of Birth/Sex: Female) Treating ROBSON, MICHAEL Primary Care Lavida Patch: Rolm GalaGrandis, Heidi Esker Dever/Extender: G Referring Annalynn Centanni: Charolotte CapuchinGrandis, Heidi Weeks in Treatment: 6733 Active Inactive ` Abuse / Safety / Falls / Self Care Management Nursing Diagnoses: Impaired physical mobility Potential for falls Goals: Patient will remain injury free Date Initiated: 12/17/2015 Target Resolution Date: 08/29/2016 Goal Status: Active Interventions: Assess fall risk on admission and as needed Notes: ` Necrotic Tissue Nursing Diagnoses: Impaired tissue integrity related to necrotic/devitalized tissue Knowledge deficit related to management of necrotic/devitalized tissue Goals: Necrotic/devitalized tissue will be minimized in the wound bed Date Initiated: 02/04/2016 Target Resolution Date: 08/29/2016 Goal Status: Active Interventions: Assess patient pain level pre-, during and post procedure and prior to discharge Provide education on necrotic tissue and debridement process Treatment Activities: Apply topical anesthetic as ordered : 02/04/2016 [name], Natalie Y. ([id number]) Notes: ` Wound/Skin Impairment Nursing Diagnoses: Impaired tissue integrity Goals: Patient/caregiver will verbalize  understanding of skin care regimen Date Initiated: 12/17/2015 Target Resolution Date: 08/29/2016 Goal Status: Active Ulcer/skin breakdown will have a volume reduction of 30% by week 4 Date Initiated: 12/17/2015 Target Resolution Date: 08/29/2016 Goal Status: Active Ulcer/skin breakdown will have a volume reduction of 50% by week 8 Date Initiated: 12/17/2015 Target Resolution Date: 08/29/2016 Goal Status: Active Ulcer/skin breakdown will have a volume reduction of 80% by week 12 Date Initiated: 12/17/2015 Target Resolution Date: 08/29/2016 Goal Status: Active Ulcer/skin breakdown will heal within 14 weeks Date Initiated: 12/17/2015 Target Resolution Date: 08/29/2016 Goal Status: Active Interventions: Assess patient/caregiver ability to obtain necessary supplies Assess patient/caregiver ability to perform ulcer/skin care regimen upon admission and as needed Assess ulceration(s) every visit Notes: Electronic Signature(s) Signed: 08/05/2016 4:45:11 PM By: Alejandro MullingPinkerton, Debra Entered By: Alejandro MullingPinkerton, Debra on 08/05/2016 08:32:08 [name], Natalie Y. ([id number]) -------------------------------------------------------------------------------- Pain Assessment Details Patient Name: [name], Natalie Y. Date of Service: 08/05/2016 8:00 AM Medical Record Patient Account Number: [id number] Number: Treating RN: Phillis Haggisinkerton, Debi 1933/08/04 (82 [name] Other Clinician: Date of Birth/Sex: Female) Treating ROBSON, MICHAEL Primary Care Shatina Streets: Rolm GalaGrandis, Heidi Jamey Demchak/Extender: G Referring Theona Muhs: Charolotte CapuchinGrandis, Heidi Weeks in Treatment:  33 Active Problems Location of Pain Severity and Description of Pain Patient Has Paino No Site Locations With Dressing Change: No Pain Management and Medication Current Pain Management: Electronic Signature(s) Signed: 08/05/2016 4:45:11 PM By: Alejandro Mulling Entered By: Alejandro Mulling on 08/05/2016 08:24:13 [name]  ([id number]) -------------------------------------------------------------------------------- Patient/Caregiver Education Details Patient Name: [name]. Date of Service: 08/05/2016 8:00 AM Medical Record Patient Account Number: [id number] Number: Treating RN: Phillis Haggis 1934-02-09 (82 [name] Other Clinician: Date of Birth/Gender: Female) Treating ROBSON, MICHAEL Primary Care Physician: [name] Physician/Extender: G Referring Physician: Charolotte Capuchin in Treatment: 73 Education Assessment Education Provided To: Patient Education Topics Provided Wound/Skin Impairment: Handouts: Other: change dressing as ordered Methods: Demonstration, Explain/Verbal Responses: State content correctly Electronic Signature(s) Signed: 08/05/2016 4:45:11 PM By: Alejandro Mulling Entered By: Alejandro Mulling on 08/05/2016 08:46:27 [name] ([id number]) -------------------------------------------------------------------------------- Wound Assessment Details Patient Name: [name] Date of Service: 08/05/2016 8:00 AM Medical Record Patient Account Number: [id number] Number: Treating RN: Phillis Haggis 09-Jul-1933 (82 [name] Other Clinician: Date of Birth/Sex: Female) Treating ROBSON, MICHAEL Primary Care Hansen Carino: [name] Brandi Tomlinson/Extender: G Referring Carrell Palmatier: Charolotte Capuchin in Treatment: 33 Wound Status Wound Number: 9 Primary Diabetic Wound/Ulcer of the Lower Etiology: Extremity Wound Location: Right Achilles Secondary Arterial Insufficiency Ulcer Wounding Event: Gradually Appeared Etiology: Date Acquired: 06/02/2015 Wound Open Weeks Of Treatment: 33 Status: Clustered Wound: No Comorbid Arrhythmia, Congestive Heart Pending Amputation On Presentation History: Failure, Hypertension, Type II Diabetes Photos Photo Uploaded By: Alejandro Mulling on 08/05/2016 09:44:29 Wound Measurements Length: (cm) 4 Width: (cm)  0.8 Depth: (cm) 0.5 Area: (cm) 2.513 Volume: (cm) 1.257 % Reduction in Area: 92.3% % Reduction in Volume: 87.1% Epithelialization: None Tunneling: No Undermining: No Wound Description Classification: Grade 2 Foul Odor Afte Wound Margin: Distinct, outline attached Exudate Amount: Large Exudate Type: Serous Exudate Color: amber r Cleansing: No Wound Bed OKSANA, DEBERRY ([id number]) Granulation Amount: Medium (34-66%) Granulation Quality: Red, Pink Necrotic Amount: Medium (34-66%) Necrotic Quality: Adherent Slough Periwound Skin Texture Texture Color No Abnormalities Noted: No No Abnormalities Noted: No Moisture Temperature / Pain No Abnormalities Noted: No Temperature: No Abnormality Tenderness on Palpation: Yes Wound Preparation Ulcer Cleansing: Rinsed/Irrigated with Saline, Other: soap and water, Topical Anesthetic Applied: Other: lidocaine 4%, Treatment Notes Wound #9 (Right Achilles) 1. Cleansed with: Clean wound with Normal Saline 2. Anesthetic Topical Lidocaine 4% cream to wound bed prior to debridement 4. Dressing Applied: Prisma Ag 5. Secondary Dressing Applied ABD Pad 7. Secured with Tape Notes kerlix, coban Electronic Signature(s) Signed: 08/05/2016 4:45:11 PM By: Alejandro Mulling Entered By: Alejandro Mulling on 08/05/2016 08:29:32 [name] ([id number]) -------------------------------------------------------------------------------- Vitals Details Patient Name: [name] Date of Service: 08/05/2016 8:00 AM Medical Record Patient Account Number: [id number] Number: Treating RN: Phillis Haggis 01/07/1934 (82 [name] Other Clinician: Date of Birth/Sex: Female) Treating ROBSON, MICHAEL Primary Care Doni Widmer: [name] Arvie Villarruel/Extender: G Referring Candler Ginsberg: Charolotte Capuchin in Treatment: 68 Vital Signs Time Taken: 08:24 Pulse (bpm): 76 Height (in): 65 Respiratory Rate (breaths/min): 16 Weight (lbs):  160 Blood Pressure (mmHg): 119/57 Body Mass Index (BMI): 26.6 Reference Range: 80 - 120 mg / dl Electronic Signature(s) Signed: 08/05/2016 4:45:11 PM By: Alejandro Mulling Entered By: Alejandro Mulling on 08/05/2016 08:24:50

## 2016-08-07 NOTE — Progress Notes (Signed)
[name], [name] ([id number]) Visit Report for 08/05/2016 Chief Complaint Document Details Patient Name: [name], [name]. Date of Service: 08/05/2016 8:00 AM Medical Record Patient Account Number: [id number] Number: Treating RN: Phillis Haggis [birth year] (83 [name] Other Clinician: Date of Birth/Sex: Female) Treating Jillyan Plitt Primary Care Provider: [name] Provider/Extender: G Referring Provider: Charolotte Capuchin in Treatment: 63 Information Obtained from: Patient Chief Complaint [name] presents today for follow-up evaluation of her diabetic foot ulcers and abdominal wound. Electronic Signature(s) Signed: 08/06/2016 5:01:50 PM By: Baltazar Najjar MD Entered By: Baltazar Najjar on 08/05/2016 08:50:21 [name] ([id number]) -------------------------------------------------------------------------------- HPI Details Patient Name: [name], [name]. Date of Service: 08/05/2016 8:00 AM Medical Record Patient Account Number: [id number] Number: Treating RN: Phillis Haggis [birth year] (83 [name] Other Clinician: Date of Birth/Sex: Female) Treating Celisse Ciulla Primary Care Provider: [name] Provider/Extender: G Referring Provider: Charolotte Capuchin in Treatment: 33 History of Present Illness Location: right posterior heel, right Achilles, right lower quadrant abdomen, right fourth toe amp site Quality: denies pain to any wound Severity: not applicable Timing: denies pain HPI Description: 83 year old patient who is known to be diabetic, was referred to [place] by Dr. Gavin Potters for a right heel ulceration which she's had for a while. She was recently in hospital for a pneumonia and at that time and got delirious and was disoriented and sometime during this time developed a stage II ulcer on her right heel. Her past medical history is significant for bilateral pneumonia which was treated with injectable antibiotics and then to oral Levaquin  which he has completed. She also has acute on chronic diastolic CHF, acute on chronic respiratory failure, end-stage renal disease on hemodialysis, atrial fibrillation, recent stroke, diabetes mellitus. The patient and her son are poor historians but from what I understand she was admitted to the hospital with an acute vascular compromise of her right lower extremity and Dr. Wyn Quaker has done a surgical procedure and we are trying to obtain these notes. There are also some vascular workup done and we will try and obtain these notes. the injury to the left lower quadrant of abdomen and the suprapubic area have been there due to a bruise and have been there for several months and no intervention has been done. 10/11/2015 -- on review of the electronics records it was noted that the patient was admitted to the hospital on 09/14/2015 with peripheral vascular disease with claudication, end-stage renal disease, pressure ulcer, chronic atrial fibrillation. She was seen by Dr. Wyn Quaker who did her right lower extremity angiogram , angioplasty of the right anterior tibial artery and thrombolysis with TPA of the right popliteal artery, and thrombectomy. She was seen by Dr. Wyn Quaker during this past week and he was pleased with the progress. He did say that if he took her to the operating room for any procedure he would debride the abdominal wound under anesthesia. She was also seen by Dr. Ether Griffins the podiatrist who thought that she may lose her right fourth toe at some stage may need an amputation of this. 10/21/2015 --patient known to Dr. Wyn Quaker and his last office visit from 10/04/2015 has been reviewed. 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 She also had a persistent ulceration of the right heel and markedly this area and a right third and fourth toe and a small scab on the calf but these are dry  and seemed  to be improving. Patient also has a left carotid endarterectomy and multiple interventions to a right brachiocephalic AV fistula. After the visit he had recommended noninvasive studies to recheck her revascularization. He was off the impression that she would likely lose the right fourth toe and the third toe was likely to heal. [name], Natalie Y. ([id number]) He was concerned about underlying muscle necrosis on her right heel and midfoot. 11/01/2015 -- an echo done in January of this year showed her left ventricular ejection fraction to be about 50-55%. The patient was seen by the PA and Dr. Driscilla Grammesdew's office and the plan was to take her to the operating room soon to have a debridement under anesthesia for the abdominal wall wound, the Achilles tendon on the right leg and amputation of the right fourth toe. The daughter and the patient do not feel that they would be able to undergo hyperbaric oxygen therapy 5 days a week for 6 weeks. 12/17/15; this is a medically complex woman who I note was recently in this clinic however I was not involved with her care. She returns today with multiple wounds; a) she has a wound in the mid abdomen that is been there since March of this year. I note that she is been to the overall for debridement recently. The exact etiology of this wound is not really clear b) left lower quadrant abdominal wound had some sanguinous drainage when she came in here. The patient fell in January and thinks this may have been secondary to a hematoma. c): The patient has 3 wounds on her right leg including a small wound on the right mid calf, a large area over the Achilles which currently has a wound VAC for the last 6 weeks, also a smaller wound on the distal part of the right heel. As far as I understand most of these wounds are currently been dressed with's calcium alginate. According to her daughter the Achilles wound under the wound VAC is doing well d) the patient is had  an amputation of her left fifth toe in January and the right fourth toe 6 weeks ago secondary to diabetic PAD e) the patient has chronic renal failure on dialysis for the last 2 years secondary to type 2 diabetes on insulin. The daughter's knowledge there is been no biopsy of the abdominal wounds given their current appearance and lack of undefined etiology at have to wonder about calciphylaxis. 12/18/15:Addendum; I [facility] I can see no relevant x-rays of the right heel. I note her arteriogram and revascularization of her right lower extremity in April 2017. She had debridement of both abdominal wounds and the right heel and Achilles wound on 11/07/15 at which time she had a right fourth toe ray amputation. The abdominal wounds were debridement again on 6/29. I do not see any relevant pathology of these abdominal wounds 12/24/15; culture I did of the drainage from the midline abdominal wound last week showed both Proteus and ampicillin sensitive enterococcus. I've given her a course of Augmentin adjusted on dialysis days. She has no specific complaints today. Been using Santyl to the abdominal wounds in the right leg wound and the wound VAC on the right Achilles which was initially prescribed by Dr. dew 12/31/15; I have done two [name] biopsies of the large midline abdominal. 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 May have been a trauma component of the one on the left lower quadrant however the midline wound had no such history. She has a large area on  the right Achilles heel with a wound VAC prescribed by Dr. dew. A small wound on the right anterior leg.Marland Kitchen. UNFORTUNATELY she has 2 new wounds today. One on the left heel which is probably a pressure area. As well her previous amputation site of her right fourth toe has dehisced and now has a small wound with significant depth at the amputation site. 01/14/2016 -- she returns after 2 weeks and had had a [name] biopsy of abdominal wound done  the last visit -- had a biopsy of her midline abdominal wound done and the Pathology diagnosis is that of ulceration, necrosis and inflammation and negative for dysplasia and malignancy. 01/21/16. 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 In the meantime she has new wounds of the left heel the right fourth toe amputation site is opened up. The back is stopped to the right heel area. 01/28/16; the abdominal wounds continued to improve. The extensive wound on her right Achilles also looks stable except for the lower aspect of the wound where there is a large liquefied area that probes right down to her calcaneus. This cultured Proteus last week I have her on Augmentin and doxycycline 1. I think this is [name], Natalie Y. ([id number]) going to need a course of IV antibiotics and I will call dialysis. X-ray I did last week was negative, I think she is going to need an MRI 02/04/16; right heel MRI as before Saturday. Receiving I believe IV Rocephin at dialysis 02/11/16; as it turns out the patient could not have a MRI as she has a bladder stimulator in place even though it is not currently in use since the beginning of this year. Although she has an allergy to IV contrast she apparently has done well with premedication so we will have to go for a CT scan with contrast. In the meantime she has had a fall now has a large skin tear on her left upper arm. She went to the ER and they suggested Tegaderm over topical antibiotics 03/03/16 currently patient returns after having been hospitalized for 2 weeks and subsequently [facility]. She actually seems to be doing excellent compared to even when we last saw her according to our nursing staff. Both patient and her daughter are extremely encouraged at how well she is presenting at this point in time. Overall the biggest issue is still the right Achilles area which is  being managed at this point in time by Dr. Wyn Quakerew. Patient is currently utilizing a wound VAC to that region. 03/17/16; patient is at [facility]. Using Aquacel Ag to the wounds on her bilateral feet and still Prisma to the small open area on her abdomen. 03/31/16 at this point in time patient has been tolerating the dressing changes currently. She fortunately has no worsening of her symptoms although she tells me that the nurse who is caring [facility] decided that nothing was needed in regard to the lower abdominal wound from a dressing standpoint at this time. she is really not having significant discomfort or pain at this point she continues to have some tunneling in the proximal Achilles wound region. 04/07/16 patient continues to do well on evaluation today. Even the Achilles wound which has been the most tender is not giving her as much trouble. Unfortunately the PolyMem dressings that we utilize last week really did not seem to benefit her in particular.  Obviously we will discontinue that at this point in time today. 04-15-16:Ms. Linenberger is accompanied today by her daughter. She is still residing in an SNF undergoing dialysis, continues to receive antibiotics during dialysis as prescribed by Dr. Sampson Goon of infectious disease. She has a follow-up appointment with Dr. Sampson Goon on 04-17-16 to discuss the continuation of these antibiotics. Ms. Digiacomo denies any pain to any of the 3 remaining wounds she does admit to changing to [employer] surgical shoes for safety while ambulating with physical therapy. She denies any falls since her last appointment here although she and her daughter do admit to increased tremors of unclear etiology since her last appointment. She has tolerated the dressing changes that were prescribed last week. 04-22-16 Ms. Breden presents today accompanied by her daughter for evaluation of her diabetic foot ulcers. She is still  residing in an SNF, she continues dialysis 3 times weekly. She'll follow up with Dr. Sampson Goon last Friday, and at that appointment IV antibiotics were discontinued. According to Ms. Hilburn and her daughter if there is any regression of her wounds Dr. Sampson Goon will consider re-starting the antibiotics. Ms. Sprunger daughter states that since the discontinuation of antibiotic therapy her "twitches" have resolved. 04/29/16; I have not seen this patient in quite some time however she is completed triple antibiotic therapy given at dialysis for osteomyelitis as prescribed by infectious disease. She is still being followed by Dr. dew of vascular surgery. We are using Hydrofera Blue to the surface of these wounds. She currently has 2 open areas a substantial area over her Achilles area although this is a lot better than the last time I saw this. She also has a small wound superiorly from this wound which has a superior probing depth of 2 cm. 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 My biopsy did not prove this nevertheless I think this is the correct clinical diagnosis. We are now following her for an area on the right Achilles part of her heel. This is not any different from last week. [name], [name] ([id number]) She also has a small tunneling area just above this. And unfortunately this week there is been a reopening of an area where her right fourth toe was previously amputated. She is completed antibiotics 05/13/16; she has a new reopening on the mid abdominal wound in the same site is previously. This is a small open area. Apligraf #1 today to the areas on the right heel o2 still an open area in the fourth toe amputation site 05/27/16; the areas on her abdomen are  completely closed over. Small open area from last time is closed. Apligraf #2 today to the areas on the right heel. The 4th toe amputation site is also heel 06/10/16; we did not have an Apligraf to put on today. This appeared tunneling wound on the right posterior calf appears to be closed. The more substantial area on her Achilles itself is improved. 06/24/16; I reapplied her third Apligraf today although we have not had one debridement last time. The original tunneling wound is not as closed as last time. The more substantial area on her Achilles itself is improved with advancing epithelialization 07/08/16 Apligraf #4 today. The original long area over her Achilles as a healthy-looking area at the superior aspect and a small divot inferiorly.  Both of the wound surface is look healthy 07/22/16; patient arrived today with a 2 original wounds on the Achilles part of her ankle too small for another application of Apligraf. This is on the right Achilles area. The small divot inferiorly looks as though it may have skin over most of this and I can't really see an open area. She did have an erythematous area over the medial malleolus with some drainage this looks like a rapid injury not cellulitis 07/29/16; erythematous area over the medial malleolus seems a lot better this week. This was a wrap injury. The areas on the right Achilles is still a small divot. The area superiorly down somewhat in size. 08/05/16; condition is generally not feeling well. No major changed either wound. Using [employer]) Signed: 08/06/2016 5:01:50 PM By: Baltazar Najjar MD Entered By: Baltazar Najjar on 08/05/2016 08:50:57 [name] ([id number]) -------------------------------------------------------------------------------- Physical Exam Details Patient Name: [name] Date of Service: 08/05/2016 8:00 AM Medical Record Patient Account Number: [id number] Number: Treating RN: Phillis Haggis 03/25/1934 (82 [name] Other Clinician: Date of Birth/Sex: Female) Treating Kaelen Brennan Primary Care Provider: [name] Provider/Extender: G Referring Provider: Charolotte Capuchin in Treatment: 33 Constitutional Sitting or standing Blood Pressure is within target range for patient.. Pulse regular and within target range for patient.Marland Kitchen Respirations regular, non-labored and within target range.. Temperature is normal and within the target range for the patient.. Somewhat fatigued looking today. Eyes Conjunctivae clear. No discharge.Marland Kitchen Respiratory Respiratory effort is easy and symmetric bilaterally. Rate is normal at rest and on room air.. Cardiovascular Pedal pulses present on the right but somewhat reduced.. Lymphatic Nonpalpable right popliteal or inguinal area. Psychiatric No evidence of depression, anxiety, or agitation. Calm, cooperative, and communicative. Appropriate interactions and affect.. Notes Wound exam; the area on the Achilles continues to be open. 2 areas which were originally part of this woman's substantive wound. Superior part has a clean surface no debridement required. It does not look much different from last week however. oThe smaller area is smaller in terms of orifice but still has 0.5 cm in depth. No palpable bone. No purulence Electronic Signature(s) Signed: 08/06/2016 5:01:50 PM By: Baltazar Najjar MD Entered By: Baltazar Najjar on 08/05/2016 08:54:44 [name] ([id number]) -------------------------------------------------------------------------------- Physician Orders Details Patient Name: [name] Date of Service: 08/05/2016 8:00 AM Medical Record Patient Account Number: [id number] Number: Treating RN: Phillis Haggis 09/26/1933 (82 [name] Other Clinician: Date of Birth/Sex: Female) Treating Oskar Cretella Primary Care Provider: [name] Provider/Extender: G Referring Provider: Charolotte Capuchin in  Treatment: 74 Verbal / Phone Orders: Yes Clinician: Ashok Cordia, Debi Read Back and Verified: Yes Diagnosis Coding Wound Cleansing Wound #9 Right Achilles o Clean wound with Normal Saline. o Cleanse wound with mild soap and water o May shower with protection. Anesthetic Wound #9 Right Achilles o Topical Lidocaine 4% cream applied to wound bed prior to debridement - all wounds for clinic use only Primary Wound Dressing Wound #9 Right Achilles o Prisma Ag - moisten with saline make sure you place a small piece in the bottom opening Secondary Dressing Wound #9 Right Achilles o ABD pad o Dry Gauze Dressing Change Frequency Wound #9 Right Achilles o Three times weekly - HHRN to change only the outside dressing do not take off the steri-strips or the mepitel. Pt has a skin sub/graft on. Follow-up Appointments Wound #9 Right Achilles o Return Appointment in 1 week. Edema Control Wound #9 Right Achilles o Kerlix and  Coban - Right Lower Extremity [name], [name] ([id number]) o Elevate legs to the level of the heart and pump ankles as often as possible Off-Loading Wound #9 Right Achilles o Other: - Float heels Home Health o [facility] - nursing Electronic Signature(s) Signed: 08/05/2016 4:45:11 PM By: Alejandro Mulling Signed: 08/06/2016 5:01:50 PM By: Baltazar Najjar MD Entered By: Alejandro Mulling on 08/05/2016 09:56:30 [name] ([id number]) -------------------------------------------------------------------------------- Problem List Details Patient Name: [name], [name]. Date of Service: 08/05/2016 8:00 AM Medical Record Patient Account Number: [id number] Number: Treating RN: Phillis Haggis 12-18-1933 (82 [name] Other Clinician: Date of Birth/Sex: Female) Treating Karrah Mangini Primary Care Provider: [name] Provider/Extender: G Referring Provider: Charolotte Capuchin in Treatment: 79 Active  Problems ICD-10 Encounter Code Description Active Date Diagnosis E11.621 Type 2 diabetes mellitus with foot ulcer 12/17/2015 Yes L97.512 Non-pressure chronic ulcer of other part of right foot with 03/31/2016 Yes fat layer exposed E11.51 Type 2 diabetes mellitus with diabetic peripheral 12/17/2015 Yes angiopathy without gangrene Inactive Problems ICD-10 Code Description Active Date Inactive Date M86.671 Other chronic osteomyelitis, right ankle and foot 04/15/2016 04/15/2016 S31.104A Unspecified open wound of abdominal wall, left lower 12/17/2015 12/17/2015 quadrant without penetration into peritoneal cavity, initial encounter S31.103D Unspecified open wound of abdominal wall, right lower 04/15/2016 04/15/2016 quadrant without penetration into peritoneal cavity, subsequent encounter Resolved Problems Electronic Signature(s) [name], [name] ([id number]) Signed: 08/06/2016 5:01:50 PM By: Baltazar Najjar MD Entered By: Baltazar Najjar on 08/05/2016 08:49:44 [name] ([id number]) -------------------------------------------------------------------------------- Progress Note Details Patient Name: [name] Date of Service: 08/05/2016 8:00 AM Medical Record Patient Account Number: [id number] Number: Treating RN: Phillis Haggis 08-Oct-1933 (82 [name] Other Clinician: Date of Birth/Sex: Female) Treating Lessie Funderburke Primary Care Provider: [name] Provider/Extender: G Referring Provider: Charolotte Capuchin in Treatment: 38 Subjective Chief Complaint Information obtained from Patient Ms. Manthey presents today for follow-up evaluation of her diabetic foot ulcers and abdominal wound. History of Present Illness (HPI) The following HPI elements were documented for the patient's wound: Location: right posterior heel, right Achilles, right lower quadrant abdomen, right fourth toe amp site Quality: denies pain to any wound Severity: not applicable Timing:  denies pain 83 year old patient who is known to be diabetic, was referred to [place] by Dr. Gavin Potters for a right heel ulceration which she's had for a while. She was recently in hospital for a pneumonia and at that time and got delirious and was disoriented and sometime during this time developed a stage II ulcer on her right heel. Her past medical history is significant for bilateral pneumonia which was treated with injectable antibiotics and then to oral Levaquin which he has completed. She also has acute on chronic diastolic CHF, acute on chronic respiratory failure, end-stage renal disease on hemodialysis, atrial fibrillation, recent stroke, diabetes mellitus. The patient and her son are poor historians but from what I understand she was admitted to the hospital with an acute vascular compromise of her right lower extremity and Dr. Wyn Quaker has done a surgical procedure and we are trying to obtain these notes. There are also some vascular workup done and we will try and obtain these notes. the injury to the left lower quadrant of abdomen and the suprapubic area have been there due to a bruise and have been there for several months and no intervention has been done. 10/11/2015 -- on review of the [facility] records it was noted that the patient was admitted to the hospital on 09/14/2015 with peripheral  vascular disease with claudication, end-stage renal disease, pressure ulcer, chronic atrial fibrillation. She was seen by Dr. Wyn Quaker who did her right lower extremity angiogram , angioplasty of the right anterior tibial artery and thrombolysis with TPA of the right popliteal artery, and thrombectomy. She was seen by Dr. Wyn Quaker during this past week and he was pleased with the progress. He did say that if he took her to the operating room for any procedure he would debride the abdominal wound under anesthesia. She was also seen by Dr. Ether Griffins the podiatrist who thought that she may lose her right fourth toe at  some stage may need an amputation of this. [name], [name] ([id number]) 10/21/2015 --patient known to Dr. Wyn Quaker and his last office visit from 10/04/2015 has been reviewed. 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 She also had a persistent ulceration of the right heel and markedly this area and a right third and fourth toe and a small scab on the calf but these are dry and seemed to be improving. Patient also has a left carotid endarterectomy and multiple interventions to a right brachiocephalic AV fistula. After the visit he had recommended noninvasive studies to recheck her revascularization. He was off the impression that she would likely lose the right fourth toe and the third toe was likely to heal. He was concerned about underlying muscle necrosis on her right heel and midfoot. 11/01/2015 -- an echo done in January of this year showed her left ventricular ejection fraction to be about 50-55%. The patient was seen by the PA and Dr. Driscilla Grammes office and the plan was to take her to the operating room soon to have a debridement under anesthesia for the abdominal wall wound, the Achilles tendon on the right leg and amputation of the right fourth toe. The daughter and the patient do not feel that they would be able to undergo hyperbaric oxygen therapy 5 days a week for 6 weeks. 12/17/15; this is a medically complex woman who I note was recently in this clinic however I was not involved with her care. She returns today with multiple wounds; a) she has a wound in the mid abdomen that is been there since March of this year. I note that she is been to the overall for debridement recently. The exact etiology of this wound is not really clear b) left lower quadrant abdominal wound had some sanguinous drainage when she came in here. The patient fell in January and thinks this may have  been secondary to a hematoma. c): The patient has 3 wounds on her right leg including a small wound on the right mid calf, a large area over the Achilles which currently has a wound VAC for the last 6 weeks, also a smaller wound on the distal part of the right heel. As far as I understand most of these wounds are currently been dressed with's calcium alginate. According to her daughter the Achilles wound under the wound VAC is doing well d) the patient is had an amputation of her left fifth toe in January and the right fourth toe 6 weeks ago secondary to diabetic PAD e) the patient has chronic renal failure on dialysis for the last 2 years secondary to type 2 diabetes on insulin. The daughter's knowledge there is been no biopsy of the abdominal wounds given their current appearance and lack of undefined etiology at have to wonder  about calciphylaxis. 12/18/15:Addendum; I have reviewed [facility] I can see no relevant x-rays of the right heel. I note her arteriogram and revascularization of her right lower extremity in April 2017. She had debridement of both abdominal wounds and the right heel and Achilles wound on 11/07/15 at which time she had a right fourth toe ray amputation. The abdominal wounds were debridement again on 6/29. I do not see any relevant pathology of these abdominal wounds 12/24/15; culture I did of the drainage from the midline abdominal wound last week showed both Proteus and ampicillin sensitive enterococcus. I've given her a course of Augmentin adjusted on dialysis days. She has no specific complaints today. Been using Santyl to the abdominal wounds in the right leg wound and the wound VAC on the right Achilles which was initially prescribed by Dr. dew 12/31/15; I have done two [name] biopsies of the large midline abdominal. 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 May have been a trauma component of the one on the left lower quadrant however the midline wound had no such history.  She has a large area on the right Achilles heel with a wound VAC prescribed by Dr. dew. A small wound on the right anterior leg.Marland Kitchen UNFORTUNATELY she has 2 new wounds today. One on the left heel which is probably a pressure area. As well her previous amputation site of her right fourth toe has dehisced and now has a small wound with significant depth at the amputation site. 01/14/2016 -- she returns after 2 weeks and had had a [name] biopsy of abdominal wound done the last visit [name], [name] ([id number]) -- had a biopsy of her midline abdominal wound done and the Pathology diagnosis is that of ulceration, necrosis and inflammation and negative for dysplasia and malignancy. 01/21/16. 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 In the meantime she has new wounds of the left heel the right fourth toe amputation site is opened up. The back is stopped to the right heel area. 01/28/16; the abdominal wounds continued to improve. The extensive wound on her right Achilles also looks stable except for the lower aspect of the wound where there is a large liquefied area that probes right down to her calcaneus. This cultured Proteus last week I have her on Augmentin and doxycycline 1. I think this is going to need a course of IV antibiotics and I will call dialysis. X-ray I did last week was negative, I think she is going to need an MRI 02/04/16; right heel MRI as before Saturday. Receiving I believe IV Rocephin at dialysis 02/11/16; as it turns out the patient could not have a MRI as she has a bladder stimulator in place even though it is not currently in use since the beginning of this year. Although she has an allergy to IV contrast she apparently has done well with premedication so we will have to go for a CT scan with contrast. In the meantime she has had a fall now has a large skin tear on her left upper arm. She went to the ER and  they suggested Tegaderm over topical antibiotics 03/03/16 currently patient returns after having been hospitalized for 2 weeks and subsequently transferred to [facility] nursing facility. She actually seems to be doing excellent compared to even when we last saw her according to our nursing staff. Both patient and her daughter are extremely encouraged at how well she is presenting at this point in time.  Overall the biggest issue is still the right Achilles area which is being managed at this point in time by Dr. Wyn Quaker. Patient is currently utilizing a wound VAC to that region. 03/17/16; patient is at [facility] nursing home still. Using Aquacel Ag to the wounds on her bilateral feet and still Prisma to the small open area on her abdomen. 03/31/16 at this point in time patient has been tolerating the dressing changes currently. She fortunately has no worsening of her symptoms although she tells me that the nurse who is caring for her at [facility] nursing facility decided that nothing was needed in regard to the lower abdominal wound from a dressing standpoint at this time. she is really not having significant discomfort or pain at this point she continues to have some tunneling in the proximal Achilles wound region. 04/07/16 patient continues to do well on evaluation today. Even the Achilles wound which has been the most tender is not giving her as much trouble. Unfortunately the PolyMem dressings that we utilize last week really did not seem to benefit her in particular. Obviously we will discontinue that at this point in time today. 04-15-16:Ms. Bangura is accompanied today by her daughter. She is still residing in an SNF undergoing dialysis, continues to receive antibiotics during dialysis as prescribed by Dr. Sampson Goon of infectious disease. She has a follow-up appointment with Dr. Sampson Goon on 04-17-16 to discuss the continuation of these antibiotics. Ms. Wallenstein denies any pain to any of the 3  remaining wounds she does admit to changing to [employer] surgical shoes for safety while ambulating with physical therapy. She denies any falls since her last appointment here although she and her daughter do admit to increased tremors of unclear etiology since her last appointment. She has tolerated the dressing changes that were prescribed last week. 04-22-16 Ms. Ehmke presents today accompanied by her daughter for evaluation of her diabetic foot ulcers. She is still residing in an SNF, she continues dialysis 3 times weekly. She'll follow up with Dr. Sampson Goon last Friday, and at that appointment IV antibiotics were discontinued. According to Ms. Mcmanamon and her daughter if there is any regression of her wounds Dr. Sampson Goon will consider re-starting the antibiotics. Ms. Cogan daughter states that since the discontinuation of antibiotic therapy her "twitches" have resolved. 04/29/16; I have not seen this patient in quite some time however she is completed triple antibiotic therapy given at dialysis for osteomyelitis as prescribed by infectious disease. She is still being followed by Dr. Wyn Quaker [name] ([id number]) of vascular surgery. We are using Hydrofera Blue to the surface of these wounds. She currently has 2 open areas a substantial area over her Achilles area although this is a lot better than the last time I saw this. She also has a small wound superiorly from this wound which has a superior probing depth of 2 cm. 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 My biopsy did not prove this nevertheless I think this is the correct clinical diagnosis. We are now following her for an area on the right Achilles part of her heel. This is not any different from last  week. She also has a small tunneling area just above this. And unfortunately this week there is been a reopening of an area where her right  fourth toe was previously amputated. She is completed antibiotics 05/13/16; she has a new reopening on the mid abdominal wound in the same site is previously. This is a small open area. Apligraf #1 today to the areas on the right heel o2 still an open area in the fourth toe amputation site 05/27/16; the areas on her abdomen are completely closed over. Small open area from last time is closed. Apligraf #2 today to the areas on the right heel. The 4th toe amputation site is also heel 06/10/16; we did not have an Apligraf to put on today. This appeared tunneling wound on the right posterior calf appears to be closed. The more substantial area on her Achilles itself is improved. 06/24/16; I reapplied her third Apligraf today although we have not had one debridement last time. The original tunneling wound is not as closed as last time. The more substantial area on her Achilles itself is improved with advancing epithelialization 07/08/16 Apligraf #4 today. The original long area over her Achilles as a healthy-looking area at the superior aspect and a small divot inferiorly. Both of the wound surface is look healthy 07/22/16; patient arrived today with a 2 original wounds on the Achilles part of her ankle too small for another application of Apligraf. This is on the right Achilles area. The small divot inferiorly looks as though it may have skin over most of this and I can't really see an open area. She did have an erythematous area over the medial malleolus with some drainage this looks like a rapid injury not cellulitis 07/29/16; erythematous area over the medial malleolus seems a lot better this week. This was a wrap injury. The areas on the right Achilles is still a small divot. The area superiorly down somewhat in size. 08/05/16; condition is generally not feeling  well. No major changed either wound. Using Prisma Objective Constitutional Sitting or standing Blood Pressure is within target range for patient.. Pulse regular and within target range for patient.Marland Kitchen Respirations regular, non-labored and within target range.. Temperature is normal and within the target range for the patient.. Somewhat fatigued looking today. Vitals Time Taken: 8:24 AM, Height: 65 in, Weight: 160 lbs, BMI: 26.6, Pulse: 76 bpm, Respiratory Rate: 16 breaths/min, Blood Pressure: 119/57 mmHg. Eyes Conjunctivae clear. No discharge.Marland Kitchen Respiratory [name], [name] ([id number]) Respiratory effort is easy and symmetric bilaterally. Rate is normal at rest and on room air.. Cardiovascular Pedal pulses present on the right but somewhat reduced.. Lymphatic Nonpalpable right popliteal or inguinal area. Psychiatric No evidence of depression, anxiety, or agitation. Calm, cooperative, and communicative. Appropriate interactions and affect.. General Notes: Wound exam; the area on the Achilles continues to be open. 2 areas which were originally part of this woman's substantive wound. Superior part has a clean surface no debridement required. It does not look much different from last week however. The smaller area is smaller in terms of orifice but still has 0.5 cm in depth. No palpable bone. No purulence Integumentary (Hair, Skin) Wound #9 status is Open. Original cause of wound was Gradually Appeared. The wound is located on the Right Achilles. The wound measures 4cm length x 0.8cm width x 0.5cm depth; 2.513cm^2 area and 1.257cm^3 volume. There is no tunneling or undermining noted. There is a large amount of serous drainage noted. The wound margin is distinct with the outline attached to the wound base. There is medium (34-66%) red, pink granulation within the wound bed. There is a medium (34-66%) amount of necrotic tissue within the  wound bed including Adherent Slough. Periwound  temperature was noted as No Abnormality. The periwound has tenderness on palpation. Assessment Active Problems ICD-10 E11.621 - Type 2 diabetes mellitus with foot ulcer L97.512 - Non-pressure chronic ulcer of other part of right foot with fat layer exposed E11.51 - Type 2 diabetes mellitus with diabetic peripheral angiopathy without gangrene Plan Wound Cleansing: Wound #9 Right Achilles: Clean wound with Normal Saline. Cleanse wound with mild soap and water [name], CILIBERTO ([id number]) May shower with protection. Anesthetic: Wound #9 Right Achilles: Topical Lidocaine 4% cream applied to wound bed prior to debridement - all wounds for clinic use only Primary Wound Dressing: Wound #9 Right Achilles: Prisma Ag - moisten with saline make sure you place a small piece in the bottom opening Secondary Dressing: Wound #9 Right Achilles: ABD pad Dry Gauze Dressing Change Frequency: Wound #9 Right Achilles: Three times weekly - HHRN to change only the outside dressing do not take off the steri-strips or the mepitel. Pt has a skin sub/graft on. Follow-up Appointments: Wound #9 Right Achilles: Return Appointment in 1 week. Edema Control: Wound #9 Right Achilles: Kerlix and Coban - Right Lower Extremity Elevate legs to the level of the heart and pump ankles as often as possible Off-Loading: Wound #9 Right Achilles: Other: - Float heels Home Health: D/C Home Health Services - nursing no change to the prisma based dresings her daughter asked todischarge homehealth and do the dressing herself which i oked after assuring supplies need to make sure the prisam gets into the small hole inferiorly not just layed over top Electronic Signature(s) Signed: 08/06/2016 3:13:28 PM By: Elliot Gurney RN, BSN, Kim RN, BSN Signed: 08/06/2016 5:01:50 PM By: Baltazar Najjar MD Entered By: Elliot Gurney, RN, BSN, Kim on 08/06/2016 15:13:28 [name]  ([id number]) -------------------------------------------------------------------------------- SuperBill Details Patient Name: [name]. Date of Service: 08/05/2016 Medical Record Patient Account Number: [id number] Number: Treating RN: Phillis Haggis 1933/08/14 (82 [name] Other Clinician: Date of Birth/Sex: Female) Treating Philomene Haff Primary Care Provider: [name] Provider/Extender: G Referring Provider: [name] Service Line: Outpatient Weeks in Treatment: 33 Diagnosis Coding ICD-10 Codes Code Description E11.621 Type 2 diabetes mellitus with foot ulcer L97.512 Non-pressure chronic ulcer of other part of right foot with fat layer exposed E11.51 Type 2 diabetes mellitus with diabetic peripheral angiopathy without gangrene Facility Procedures CPT4 Code: [id number] Description: 99213 - WOUND CARE VISIT-LEV 3 EST PT Modifier: Quantity: 1 Physician Procedures CPT4 Code Description: [id number] 99213 - WC PHYS LEVEL 3 - EST PT ICD-10 Description Diagnosis E11.621 Type 2 diabetes mellitus with foot ulcer L97.512 Non-pressure chronic ulcer of other part of right fo Modifier: ot with fat lay Quantity: 1 er exposed Electronic Signature(s) Signed: 08/05/2016 4:45:11 PM By: Alejandro Mulling Signed: 08/06/2016 5:01:50 PM By: Baltazar Najjar MD Entered By: Alejandro Mulling on 08/05/2016 09:52:08

## 2016-08-09 ENCOUNTER — Inpatient Hospital Stay
Admission: EM | Admit: 2016-08-09 | Discharge: 2016-08-17 | DRG: 539 | Disposition: A | Discharge status: Skilled Nursing Facility | Payer: Medicare Other | Attending: Internal Medicine | Admitting: Internal Medicine

## 2016-08-09 ENCOUNTER — Emergency Department: Payer: Medicare Other

## 2016-08-09 DIAGNOSIS — R059 Cough, unspecified: Secondary | ICD-10-CM

## 2016-08-09 DIAGNOSIS — L97419 Non-pressure chronic ulcer of right heel and midfoot with unspecified severity: Secondary | ICD-10-CM | POA: Diagnosis present

## 2016-08-09 DIAGNOSIS — Z96643 Presence of artificial hip joint, bilateral: Secondary | ICD-10-CM | POA: Diagnosis present

## 2016-08-09 DIAGNOSIS — E1151 Type 2 diabetes mellitus with diabetic peripheral angiopathy without gangrene: Secondary | ICD-10-CM | POA: Diagnosis present

## 2016-08-09 DIAGNOSIS — G2581 Restless legs syndrome: Secondary | ICD-10-CM | POA: Diagnosis present

## 2016-08-09 DIAGNOSIS — Z89421 Acquired absence of other right toe(s): Secondary | ICD-10-CM

## 2016-08-09 DIAGNOSIS — E871 Hypo-osmolality and hyponatremia: Secondary | ICD-10-CM

## 2016-08-09 DIAGNOSIS — I272 Pulmonary hypertension, unspecified: Secondary | ICD-10-CM | POA: Diagnosis present

## 2016-08-09 DIAGNOSIS — M4647 Discitis, unspecified, lumbosacral region: Secondary | ICD-10-CM

## 2016-08-09 DIAGNOSIS — Z8673 Personal history of transient ischemic attack (TIA), and cerebral infarction without residual deficits: Secondary | ICD-10-CM

## 2016-08-09 DIAGNOSIS — Z885 Allergy status to narcotic agent status: Secondary | ICD-10-CM

## 2016-08-09 DIAGNOSIS — F411 Generalized anxiety disorder: Secondary | ICD-10-CM | POA: Diagnosis present

## 2016-08-09 DIAGNOSIS — N39498 Other specified urinary incontinence: Secondary | ICD-10-CM | POA: Diagnosis present

## 2016-08-09 DIAGNOSIS — Z91048 Other nonmedicinal substance allergy status: Secondary | ICD-10-CM

## 2016-08-09 DIAGNOSIS — M5416 Radiculopathy, lumbar region: Secondary | ICD-10-CM | POA: Diagnosis present

## 2016-08-09 DIAGNOSIS — R627 Adult failure to thrive: Secondary | ICD-10-CM | POA: Diagnosis present

## 2016-08-09 DIAGNOSIS — M545 Low back pain, unspecified: Secondary | ICD-10-CM

## 2016-08-09 DIAGNOSIS — Z89422 Acquired absence of other left toe(s): Secondary | ICD-10-CM

## 2016-08-09 DIAGNOSIS — K219 Gastro-esophageal reflux disease without esophagitis: Secondary | ICD-10-CM | POA: Diagnosis present

## 2016-08-09 DIAGNOSIS — E785 Hyperlipidemia, unspecified: Secondary | ICD-10-CM | POA: Diagnosis present

## 2016-08-09 DIAGNOSIS — I5032 Chronic diastolic (congestive) heart failure: Secondary | ICD-10-CM | POA: Diagnosis present

## 2016-08-09 DIAGNOSIS — Z992 Dependence on renal dialysis: Secondary | ICD-10-CM

## 2016-08-09 DIAGNOSIS — E875 Hyperkalemia: Secondary | ICD-10-CM

## 2016-08-09 DIAGNOSIS — E11621 Type 2 diabetes mellitus with foot ulcer: Secondary | ICD-10-CM | POA: Diagnosis present

## 2016-08-09 DIAGNOSIS — M109 Gout, unspecified: Secondary | ICD-10-CM | POA: Diagnosis present

## 2016-08-09 DIAGNOSIS — R05 Cough: Secondary | ICD-10-CM

## 2016-08-09 DIAGNOSIS — I132 Hypertensive heart and chronic kidney disease with heart failure and with stage 5 chronic kidney disease, or end stage renal disease: Secondary | ICD-10-CM | POA: Diagnosis present

## 2016-08-09 DIAGNOSIS — I482 Chronic atrial fibrillation, unspecified: Secondary | ICD-10-CM | POA: Diagnosis present

## 2016-08-09 DIAGNOSIS — M4636 Infection of intervertebral disc (pyogenic), lumbar region: Secondary | ICD-10-CM | POA: Diagnosis not present

## 2016-08-09 DIAGNOSIS — E1122 Type 2 diabetes mellitus with diabetic chronic kidney disease: Secondary | ICD-10-CM | POA: Diagnosis present

## 2016-08-09 DIAGNOSIS — D631 Anemia in chronic kidney disease: Secondary | ICD-10-CM | POA: Diagnosis present

## 2016-08-09 DIAGNOSIS — N200 Calculus of kidney: Secondary | ICD-10-CM | POA: Diagnosis present

## 2016-08-09 DIAGNOSIS — E119 Type 2 diabetes mellitus without complications: Secondary | ICD-10-CM

## 2016-08-09 DIAGNOSIS — L97409 Non-pressure chronic ulcer of unspecified heel and midfoot with unspecified severity: Secondary | ICD-10-CM | POA: Diagnosis present

## 2016-08-09 DIAGNOSIS — F329 Major depressive disorder, single episode, unspecified: Secondary | ICD-10-CM | POA: Diagnosis present

## 2016-08-09 DIAGNOSIS — Z833 Family history of diabetes mellitus: Secondary | ICD-10-CM

## 2016-08-09 DIAGNOSIS — N2581 Secondary hyperparathyroidism of renal origin: Secondary | ICD-10-CM | POA: Diagnosis present

## 2016-08-09 DIAGNOSIS — N186 End stage renal disease: Secondary | ICD-10-CM | POA: Diagnosis not present

## 2016-08-09 DIAGNOSIS — M549 Dorsalgia, unspecified: Secondary | ICD-10-CM

## 2016-08-09 DIAGNOSIS — Z79899 Other long term (current) drug therapy: Secondary | ICD-10-CM

## 2016-08-09 DIAGNOSIS — E1140 Type 2 diabetes mellitus with diabetic neuropathy, unspecified: Secondary | ICD-10-CM | POA: Diagnosis present

## 2016-08-09 DIAGNOSIS — Z91041 Radiographic dye allergy status: Secondary | ICD-10-CM

## 2016-08-09 DIAGNOSIS — N3281 Overactive bladder: Secondary | ICD-10-CM | POA: Diagnosis present

## 2016-08-09 DIAGNOSIS — Z800 Family history of malignant neoplasm of digestive organs: Secondary | ICD-10-CM

## 2016-08-09 DIAGNOSIS — Z7901 Long term (current) use of anticoagulants: Secondary | ICD-10-CM

## 2016-08-09 DIAGNOSIS — Z66 Do not resuscitate: Secondary | ICD-10-CM | POA: Diagnosis present

## 2016-08-09 DIAGNOSIS — Z794 Long term (current) use of insulin: Secondary | ICD-10-CM

## 2016-08-09 DIAGNOSIS — Z823 Family history of stroke: Secondary | ICD-10-CM

## 2016-08-09 DIAGNOSIS — M4640 Discitis, unspecified, site unspecified: Secondary | ICD-10-CM | POA: Diagnosis present

## 2016-08-09 DIAGNOSIS — G8929 Other chronic pain: Secondary | ICD-10-CM | POA: Diagnosis present

## 2016-08-09 DIAGNOSIS — R1030 Lower abdominal pain, unspecified: Secondary | ICD-10-CM

## 2016-08-09 LAB — URINALYSIS, COMPLETE (UACMP) WITH MICROSCOPIC
BILIRUBIN URINE: NEGATIVE
Glucose, UA: NEGATIVE mg/dL
Ketones, ur: NEGATIVE mg/dL
Nitrite: NEGATIVE
Protein, ur: 100 mg/dL — AB
SPECIFIC GRAVITY, URINE: 1.014 (ref 1.005–1.030)
pH: 6 (ref 5.0–8.0)

## 2016-08-09 LAB — CBC WITH DIFFERENTIAL/PLATELET
Basophils Absolute: 0.1 10*3/uL (ref 0–0.1)
Basophils Relative: 1 %
Eosinophils Absolute: 0.2 10*3/uL (ref 0–0.7)
Eosinophils Relative: 2 %
HCT: 29 % — ABNORMAL LOW (ref 35.0–47.0)
Hemoglobin: 10.1 g/dL — ABNORMAL LOW (ref 12.0–16.0)
Lymphocytes Relative: 11 %
Lymphs Abs: 0.8 10*3/uL — ABNORMAL LOW (ref 1.0–3.6)
MCH: 33.8 pg (ref 26.0–34.0)
MCHC: 34.9 g/dL (ref 32.0–36.0)
MCV: 96.7 fL (ref 80.0–100.0)
Monocytes Absolute: 0.7 10*3/uL (ref 0.2–0.9)
Monocytes Relative: 9 %
Neutro Abs: 5.7 10*3/uL (ref 1.4–6.5)
Neutrophils Relative %: 77 %
Platelets: 251 10*3/uL (ref 150–440)
RBC: 3 MIL/uL — ABNORMAL LOW (ref 3.80–5.20)
RDW: 15.4 % — ABNORMAL HIGH (ref 11.5–14.5)
WBC: 7.5 10*3/uL (ref 3.6–11.0)

## 2016-08-09 LAB — COMPREHENSIVE METABOLIC PANEL
ALT: 12 U/L — ABNORMAL LOW (ref 14–54)
AST: 18 U/L (ref 15–41)
Albumin: 3.2 g/dL — ABNORMAL LOW (ref 3.5–5.0)
Alkaline Phosphatase: 69 U/L (ref 38–126)
Anion gap: 15 (ref 5–15)
BUN: 65 mg/dL — ABNORMAL HIGH (ref 6–20)
CO2: 24 mmol/L (ref 22–32)
Calcium: 8.7 mg/dL — ABNORMAL LOW (ref 8.9–10.3)
Chloride: 93 mmol/L — ABNORMAL LOW (ref 101–111)
Creatinine, Ser: 5.46 mg/dL — ABNORMAL HIGH (ref 0.44–1.00)
GFR calc Af Amer: 8 mL/min — ABNORMAL LOW (ref >60)
GFR calc non Af Amer: 7 mL/min — ABNORMAL LOW (ref >60)
Glucose, Bld: 127 mg/dL — ABNORMAL HIGH (ref 65–99)
Potassium: 6.1 mmol/L — ABNORMAL HIGH (ref 3.5–5.1)
Sodium: 132 mmol/L — ABNORMAL LOW (ref 135–145)
Total Bilirubin: 0.6 mg/dL (ref 0.3–1.2)
Total Protein: 7.4 g/dL (ref 6.5–8.1)

## 2016-08-09 MED ORDER — SODIUM CHLORIDE 0.9 % IV SOLN
1.0000 g | Freq: Once | INTRAVENOUS | Status: AC
  Administered 2016-08-09: 1 g via INTRAVENOUS
  Filled 2016-08-09: qty 10

## 2016-08-09 MED ORDER — BARIUM SULFATE 2.1 % PO SUSP: 450.0000 mL | ORAL | Status: AC

## 2016-08-09 MED ORDER — TRAMADOL HCL 50 MG PO TABS
50.0000 mg | ORAL_TABLET | Freq: Once | ORAL | Status: AC
  Administered 2016-08-09: 50 mg via ORAL
  Filled 2016-08-09: qty 1

## 2016-08-09 MED ORDER — DEXTROSE 5 % IV SOLN: 1.0000 g | Freq: Once | INTRAVENOUS | Status: DC

## 2016-08-09 MED ORDER — CEFTRIAXONE SODIUM-DEXTROSE 1-3.74 GM-% IV SOLR
1.0000 g | Freq: Once | INTRAVENOUS | Status: AC
  Administered 2016-08-09: 1 g via INTRAVENOUS
  Filled 2016-08-09: qty 50

## 2016-08-09 MED ORDER — VANCOMYCIN HCL IN DEXTROSE 1-5 GM/200ML-% IV SOLN
1000.0000 mg | Freq: Once | INTRAVENOUS | Status: AC
  Administered 2016-08-10: 1000 mg via INTRAVENOUS
  Filled 2016-08-09: qty 200

## 2016-08-09 MED ORDER — DEXTROSE 50 % IV SOLN
25.0000 g | Freq: Once | INTRAVENOUS | Status: AC
  Administered 2016-08-09: 25 g via INTRAVENOUS
  Filled 2016-08-09: qty 50

## 2016-08-09 MED ORDER — INSULIN ASPART 100 UNIT/ML ~~LOC~~ SOLN
10.0000 [IU] | Freq: Once | SUBCUTANEOUS | Status: AC
  Administered 2016-08-09: 10 [IU] via INTRAVENOUS
  Filled 2016-08-09: qty 10

## 2016-08-09 MED ORDER — PIPERACILLIN-TAZOBACTAM 3.375 G IVPB
3.3750 g | Freq: Once | INTRAVENOUS | Status: DC
  Filled 2016-08-09: qty 50

## 2016-08-09 NOTE — ED Notes (Signed)
Placed pink no BP/sticks sleeve on patient. Placed latex allergy bracelet on patient.

## 2016-08-09 NOTE — ED Provider Notes (Signed)
[facility] Emergency Department Provider Note        Time seen: ----------------------------------------- 8:45 PM on 08/09/2016 -----------------------------------------    I have reviewed the triage vital signs and the nursing notes.   HISTORY  Chief Complaint Abdominal Pain (Lower groin and lower abdomen)    HPI [name] is a 83 y.o. female who presents to ER with 1 week of lower abdominal pain is worse with movement. Patient states lower abdomen is location of her pain. Patient states she doesn't vomit since having her gallbladder out but rather she almost has this week. Pain is currently 8 out of 10 in the abdomen.Nothing makes his symptoms better or worse. She has had one episode of vomiting. She denies fevers, chills, chest pain or shortness of breath. She is due for dialysis tomorrow.   Past Medical History:  Diagnosis Date  . Anemia   . Anxiety   . Arthritis    gout  . Atrial fibrillation (HCC)   . CHF (congestive heart failure) (HCC)    has had this off and on prior to dialysis but has had several times since  . Chronic kidney disease    end stage renal insufficiency.  on dialysis M,W,F  . Diabetes mellitus without complication (HCC)   . Dialysis patient [facility])    Mon.- Wed- Fri.  . Dysrhythmia    afib/flutter  . GERD (gastroesophageal reflux disease)   . Hypertension   . 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 Peripheral vascular disease (HCC)   . Pleural effusion   . Pulmonary hypertension   . Renal insufficiency   . Restless leg syndrome   . Shortness of breath dyspnea    with exertion  . Stroke (HCC) 06/2015   has had 1 other stroke. first one affected vision. the most recent one affected right arm    Patient Active Problem List   Diagnosis Date Noted  . Atherosclerosis of native arteries of the extremities with ulceration (HCC) 04/14/2016  . Kidney dialysis as the cause of abnormal reaction of the  patient, or of later complication, without mention of misadventure at the time of the procedure (CODE) 03/03/2016  . Pressure injury of skin 02/18/2016  . Osteomyelitis (HCC) 02/14/2016  . Hematoma 10/12/2015  . Leg pain, right 09/14/2015  . PVD (peripheral vascular disease) with claudication (HCC) 09/14/2015  . Pneumonia 08/13/2015  . Carotid stenosis 07/26/2015  . Amputation of fifth toe, left, traumatic (HCC) 06/24/2015  . Diabetes mellitus (HCC) 06/23/2015  . Diabetic neuropathy (HCC) 06/23/2015  . Chronic atrial fibrillation (HCC) 06/23/2015  . Left carotid artery stenosis 06/23/2015  . Cerebral infarction (HCC) 06/21/2015  . Pressure ulcer 06/19/2015  . End stage renal disease (HCC) 06/18/2015  . Respiratory distress 12/11/2014    Past Surgical History:  Procedure Laterality Date  . AMPUTATION Right 11/07/2015   Procedure: AMPUTATION DIGIT ( 4TH TOE, RIGHT FOOT );  Surgeon: Annice Needy, MD;  Location: [facility] ORS;  Service: Vascular;  Laterality: Right;  . AMPUTATION TOE Left 06/07/2015   Procedure: AMPUTATION TOE;  Surgeon: Gwyneth Revels, DPM;  Location: [facility] ORS;  Service: Podiatry;  Laterality: Left;  . CARDIAC CATHETERIZATION    . CATARACT EXTRACTION, BILATERAL Bilateral   . CHOLECYSTECTOMY    . DILATION AND CURETTAGE OF UTERUS    . ENDARTERECTOMY Left 07/26/2015   Procedure: ENDARTERECTOMY CAROTID;  Surgeon: Renford Dills, MD;  Location: [facility] ORS;  Service: Vascular;  Laterality: Left;  .  EYE SURGERY    . FISTULAGRAM ([facility] HX)    . HEEL DECUBITUS ULCER EXCISION Right 05/13/2016  . INCISION AND DRAINAGE ABSCESS Right 02/16/2016   Procedure: INCISION AND DRAINAGE ABSCESS and application wound vac;  Surgeon: Bertram Denver, MD;  Location: [facility] ORS;  Service: Urology;  Laterality: Right;  . JOINT REPLACEMENT     bilateral hip replacement  . MEDTRONIC BLADDER INTERSTEM     currently turned off for MRI in January  . PARATHYROIDECTOMY    . PERIPHERAL VASCULAR CATHETERIZATION  N/A 10/08/2014   Procedure: A/V Shuntogram/Fistulagram;  Surgeon: Annice Needy, MD;  Location: [facility];  Service: Cardiovascular;  Laterality: N/A;  . PERIPHERAL VASCULAR CATHETERIZATION N/A 03/04/2015   Procedure: A/V Shuntogram/Fistulagram;  Surgeon: Annice Needy, MD;  Location: [facility];  Service: Cardiovascular;  Laterality: N/A;  . PERIPHERAL VASCULAR CATHETERIZATION N/A 03/04/2015   Procedure: A/V Shunt Intervention;  Surgeon: Annice Needy, MD;  Location: [facility];  Service: Cardiovascular;  Laterality: N/A;  . PERIPHERAL VASCULAR CATHETERIZATION N/A 09/16/2015   Procedure: Lower Extremity Angiography;  Surgeon: Annice Needy, MD;  Location: [facility];  Service: Cardiovascular;  Laterality: N/A;  . PERIPHERAL VASCULAR CATHETERIZATION  09/16/2015   Procedure: Lower Extremity Intervention;  Surgeon: Annice Needy, MD;  Location: [facility];  Service: Cardiovascular;;  . PERIPHERAL VASCULAR CATHETERIZATION Right 04/27/2016   Procedure: A/V Shuntogram/Fistulagram;  Surgeon: Annice Needy, MD;  Location: [facility];  Service: Cardiovascular;  Laterality: Right;  . PERIPHERAL VASCULAR CATHETERIZATION Left 05/14/2016   Procedure: DIALYSIS/PERMA CATHETER INSERTION;  Surgeon: Annice Needy, MD;  Location: [facility] OR[facility];  Service: Vascular;  Laterality: Left;  . PERIPHERAL VASCULAR CATHETERIZATION N/A 06/08/2016   Procedure: Dialysis/Perma Catheter Removal;  Surgeon: Annice Needy, MD;  Location: [facility];  Service: Cardiovascular;  Laterality: N/A;  . REVISON OF ARTERIOVENOUS FISTULA Right 05/14/2016   Procedure: REVISON OF ARTERIOVENOUS FISTULA ( WITH ARTEGRAFT );  Surgeon: Annice Needy, MD;  Location: [facility] OR[facility];  Service: Vascular;  Laterality: Right;  . THYROIDECTOMY, PARTIAL    . TONSILLECTOMY    . WOUND DEBRIDEMENT Right 11/07/2015   Procedure: DEBRIDEMENT WOUND ( RIGHT HEEL AND ABDOMINAL WOUND (2) );  Surgeon: Annice Needy, MD;  Location: [facility]  ORS;  Service: Vascular;  Laterality: Right;  . WOUND DEBRIDEMENT N/A 11/28/2015   Procedure: DEBRIDEMENT WOUND ( ABDOMINAL );  Surgeon: Annice Needy, MD;  Location: [facility] OR[facility];  Service: Vascular;  Laterality: N/A;    Allergies Codeine; Contrast media [iodinated diagnostic agents]; Oxycodone; Quinine derivatives; and Tape  Social History Social History  Substance Use Topics  . Smoking status: Never Smoker  . Smokeless tobacco: Never Used  . Alcohol use No    Review of Systems Constitutional: Negative for fever. Cardiovascular: Negative for chest pain. Respiratory: Negative for shortness of breath. Gastrointestinal: Positive for abdominal pain, vomiting Musculoskeletal:Positive for back pain Skin: Negative for rash. Neurological: Negative for headaches, focal weakness or numbness.  10-point ROS otherwise negative.  ____________________________________________   PHYSICAL EXAM:  VITAL SIGNS: ED Triage Vitals  Enc Vitals Group     BP 08/09/16 2008 (!) 116/44     Pulse Rate 08/09/16 2008 (!) 54     Resp 08/09/16 2008 18     Temp 08/09/16 2008 98.5 F (36.9 C)     Temp Source 08/09/16 2008 Oral     SpO2 --  Weight 08/09/16 2009 160 lb (72.6 kg)     Height 08/09/16 2009 5\' 5"  (1.651 m)     Head Circumference --      Peak Flow --      Pain Score 08/09/16 2010 8     Pain Loc --      Pain Edu? --      Excl. in GC? --     Constitutional: Alert and oriented. Well appearing and in no distress. Eyes: Conjunctivae are normal. PERRL. Normal extraocular movements. ENT   Head: Normocephalic and atraumatic.   Nose: No congestion/rhinnorhea.   Mouth/Throat: Mucous membranes are moist.   Neck: No stridor. Cardiovascular: Normal rate, regular rhythm. Harsh systolic murmur appreciated Respiratory: Normal respiratory effort without tachypnea nor retractions. Breath sounds are clear and equal bilaterally. No wheezes/rales/rhonchi. Gastrointestinal: Lower abdominal  tenderness diffusely, no rebound or guarding. Normal bowel sounds. Musculoskeletal: Nontender with normal range of motion in all extremities. No lower extremity tenderness nor edema. Neurologic:  Normal speech and language. No gross focal neurologic deficits are appreciated. No strength or sensory deficits are appreciated Skin:  Skin is warm, dry and intact. No rash noted. Psychiatric: Mood and affect are normal. Speech and behavior are normal.  ____________________________________________  ED COURSE:  Pertinent labs & imaging results that were available during my care of the patient were reviewed by me and considered in my medical decision making (see chart for details). Patient presents to ER with abdominal pain of uncertain etiology. We will assess with labs and imaging.   Procedures ____________________________________________   LABS (pertinent positives/negatives)  Labs Reviewed  COMPREHENSIVE METABOLIC PANEL - Abnormal; Notable for the following:       Result Value   Sodium 132 (*)    Potassium 6.1 (*)    Chloride 93 (*)    Glucose, Bld 127 (*)    BUN 65 (*)    Creatinine, Ser 5.46 (*)    Calcium 8.7 (*)    Albumin 3.2 (*)    ALT 12 (*)    GFR calc non Af Amer 7 (*)    GFR calc Af Amer 8 (*)    All other components within normal limits  CBC WITH DIFFERENTIAL/PLATELET - Abnormal; Notable for the following:    RBC 3.00 (*)    Hemoglobin 10.1 (*)    HCT 29.0 (*)    RDW 15.4 (*)    Lymphs Abs 0.8 (*)    All other components within normal limits  URINALYSIS, COMPLETE (UACMP) WITH MICROSCOPIC - Abnormal; Notable for the following:    Color, Urine AMBER (*)    APPearance TURBID (*)    Hgb urine dipstick MODERATE (*)    Protein, ur 100 (*)    Leukocytes, UA LARGE (*)    Bacteria, UA MANY (*)    Squamous Epithelial / LPF 0-5 (*)    All other components within normal limits  URINE CULTURE  CULTURE, BLOOD (ROUTINE X 2)  CULTURE, BLOOD (ROUTINE X 2)    RADIOLOGY Images  were viewed by me  CT of the abdomen and pelvis with contrast IMPRESSION: 1. Described changes at L2-3 interspace suspicious for discitis/ osteomyelitis and inflammatory changes in the adjacent paraspinal and psoas muscles. 2. Small bilateral pleural effusions with basilar atelectasis. Small esophageal hiatal hernia. 9 mm nodule in the right lung base is unchanged since prior study. 3. Large nonobstructing stone in the superior pole right kidney. 4. Extensive aortic atherosclerosis and calcification throughout the abdominal vasculature. 5. New ventral  abdominal wall hernia containing fat. These results were called by telephone at the time of interpretation on 08/09/2016 at 11:21 pm to Dr. Daryel November , who verbally acknowledged these results.  CRITICAL CARE Performed by: Emily Filbert   Total critical care time: 30 minutes  Critical care time was exclusive of separately billable procedures and treating other patients.  Critical care was necessary to treat or prevent imminent or life-threatening deterioration.  Critical care was time spent personally by me on the following activities: development of treatment plan with patient and/or surrogate as well as nursing, discussions with consultants, evaluation of patient's response to treatment, examination of patient, obtaining history from patient or surrogate, ordering and performing treatments and interventions, ordering and review of laboratory studies, ordering and review of radiographic studies, pulse oximetry and re-evaluation of patient's condition.  ____________________________________________  FINAL ASSESSMENT AND PLAN  Abdominal pain, Possible discitis, End stage renal disease, hyperkalemia   Plan: Patient with labs and imaging as dictated above. Patient presented to the ER with vague symptoms of lower abdominal pain. This may be referred pain from her back. CT scan findings were concerning for discitis. We have  ordered broad-spectrum antibiotics and she will need MRI and possibly neurosurgical evaluation. This point she doesn't have neurologic deficits particularly in the lower extremities. She has been treated for hyperkalemia and will need dialysis in the morning. Family and patient are agreeable to admission at this time.   Emily Filbert, MD   Note: This note was generated in part or whole with voice recognition software. Voice recognition is usually quite accurate but there are transcription errors that can and very often do occur. I apologize for any typographical errors that were not detected and corrected.     Emily Filbert, MD 08/09/16 [phone]

## 2016-08-09 NOTE — ED Triage Notes (Signed)
Patient presents with one week of lower abdominal pain worse with movement. States lower abdomen/groin area. States she doesn't vomit since having her gallbladder out but rather "hocks and spits" and did have an episode once this week of that. Is a dialysis patient and does not make urine very often. Dialysis shunt in right upper arm.

## 2016-08-09 NOTE — ED Triage Notes (Signed)
Addendum[facility]

## 2016-08-09 NOTE — ED Notes (Signed)
Patient c/o lower abdominal pain X 1 week with increasing severity today. Patient reports 1 episode of nausea with dry heaving. Pt reports hx of spastic esophagus. Patient's daughter reports patient at a large bag of popcorn last Sunday and is worried about the possibility of diverticulitis.

## 2016-08-09 NOTE — ED Notes (Signed)
[facility]

## 2016-08-09 NOTE — ED Notes (Signed)
MD Williams at bedside.  

## 2016-08-10 ENCOUNTER — Encounter: Payer: Self-pay | Admitting: *Deleted

## 2016-08-10 ENCOUNTER — Inpatient Hospital Stay: Payer: Medicare Other

## 2016-08-10 DIAGNOSIS — F411 Generalized anxiety disorder: Secondary | ICD-10-CM | POA: Diagnosis present

## 2016-08-10 DIAGNOSIS — G8929 Other chronic pain: Secondary | ICD-10-CM | POA: Diagnosis present

## 2016-08-10 DIAGNOSIS — I5032 Chronic diastolic (congestive) heart failure: Secondary | ICD-10-CM | POA: Diagnosis present

## 2016-08-10 DIAGNOSIS — M109 Gout, unspecified: Secondary | ICD-10-CM | POA: Diagnosis present

## 2016-08-10 DIAGNOSIS — L97409 Non-pressure chronic ulcer of unspecified heel and midfoot with unspecified severity: Secondary | ICD-10-CM | POA: Diagnosis present

## 2016-08-10 DIAGNOSIS — E11621 Type 2 diabetes mellitus with foot ulcer: Secondary | ICD-10-CM | POA: Diagnosis present

## 2016-08-10 DIAGNOSIS — E1122 Type 2 diabetes mellitus with diabetic chronic kidney disease: Secondary | ICD-10-CM | POA: Diagnosis present

## 2016-08-10 DIAGNOSIS — N2581 Secondary hyperparathyroidism of renal origin: Secondary | ICD-10-CM | POA: Diagnosis present

## 2016-08-10 DIAGNOSIS — E875 Hyperkalemia: Secondary | ICD-10-CM | POA: Diagnosis present

## 2016-08-10 DIAGNOSIS — M4636 Infection of intervertebral disc (pyogenic), lumbar region: Secondary | ICD-10-CM | POA: Diagnosis present

## 2016-08-10 DIAGNOSIS — M5416 Radiculopathy, lumbar region: Secondary | ICD-10-CM | POA: Diagnosis present

## 2016-08-10 DIAGNOSIS — F329 Major depressive disorder, single episode, unspecified: Secondary | ICD-10-CM | POA: Diagnosis present

## 2016-08-10 DIAGNOSIS — I272 Pulmonary hypertension, unspecified: Secondary | ICD-10-CM | POA: Diagnosis present

## 2016-08-10 DIAGNOSIS — K219 Gastro-esophageal reflux disease without esophagitis: Secondary | ICD-10-CM | POA: Diagnosis present

## 2016-08-10 DIAGNOSIS — L97419 Non-pressure chronic ulcer of right heel and midfoot with unspecified severity: Secondary | ICD-10-CM | POA: Diagnosis present

## 2016-08-10 DIAGNOSIS — N200 Calculus of kidney: Secondary | ICD-10-CM | POA: Diagnosis present

## 2016-08-10 DIAGNOSIS — E1151 Type 2 diabetes mellitus with diabetic peripheral angiopathy without gangrene: Secondary | ICD-10-CM | POA: Diagnosis present

## 2016-08-10 DIAGNOSIS — N186 End stage renal disease: Secondary | ICD-10-CM | POA: Diagnosis present

## 2016-08-10 DIAGNOSIS — I482 Chronic atrial fibrillation: Secondary | ICD-10-CM | POA: Diagnosis present

## 2016-08-10 DIAGNOSIS — I132 Hypertensive heart and chronic kidney disease with heart failure and with stage 5 chronic kidney disease, or end stage renal disease: Secondary | ICD-10-CM | POA: Diagnosis present

## 2016-08-10 DIAGNOSIS — E1140 Type 2 diabetes mellitus with diabetic neuropathy, unspecified: Secondary | ICD-10-CM | POA: Diagnosis present

## 2016-08-10 DIAGNOSIS — E785 Hyperlipidemia, unspecified: Secondary | ICD-10-CM | POA: Diagnosis present

## 2016-08-10 DIAGNOSIS — D631 Anemia in chronic kidney disease: Secondary | ICD-10-CM | POA: Diagnosis present

## 2016-08-10 DIAGNOSIS — E871 Hypo-osmolality and hyponatremia: Secondary | ICD-10-CM | POA: Diagnosis present

## 2016-08-10 DIAGNOSIS — G2581 Restless legs syndrome: Secondary | ICD-10-CM | POA: Diagnosis present

## 2016-08-10 LAB — BASIC METABOLIC PANEL
ANION GAP: 14 (ref 5–15)
BUN: 65 mg/dL — ABNORMAL HIGH (ref 6–20)
CHLORIDE: 91 mmol/L — AB (ref 101–111)
CO2: 22 mmol/L (ref 22–32)
Calcium: 8.9 mg/dL (ref 8.9–10.3)
Creatinine, Ser: 5.37 mg/dL — ABNORMAL HIGH (ref 0.44–1.00)
GFR calc non Af Amer: 7 mL/min — ABNORMAL LOW (ref >60)
GFR, EST AFRICAN AMERICAN: 8 mL/min — AB (ref >60)
Glucose, Bld: 110 mg/dL — ABNORMAL HIGH (ref 65–99)
POTASSIUM: 5.6 mmol/L — AB (ref 3.5–5.1)
Sodium: 127 mmol/L — ABNORMAL LOW (ref 135–145)

## 2016-08-10 LAB — CBC
HCT: 29.1 % — ABNORMAL LOW (ref 35.0–47.0)
HEMOGLOBIN: 10.1 g/dL — AB (ref 12.0–16.0)
MCH: 33.7 pg (ref 26.0–34.0)
MCHC: 34.6 g/dL (ref 32.0–36.0)
MCV: 97.2 fL (ref 80.0–100.0)
Platelets: 252 10*3/uL (ref 150–440)
RBC: 2.99 MIL/uL — AB (ref 3.80–5.20)
RDW: 15.5 % — AB (ref 11.5–14.5)
WBC: 8 10*3/uL (ref 3.6–11.0)

## 2016-08-10 LAB — GLUCOSE, CAPILLARY
GLUCOSE-CAPILLARY: 162 mg/dL — AB (ref 65–99)
GLUCOSE-CAPILLARY: 81 mg/dL (ref 65–99)
GLUCOSE-CAPILLARY: 88 mg/dL (ref 65–99)
Glucose-Capillary: 117 mg/dL — ABNORMAL HIGH (ref 65–99)
Glucose-Capillary: 95 mg/dL (ref 65–99)
Glucose-Capillary: 146 mg/dL — ABNORMAL HIGH (ref 65–99)

## 2016-08-10 LAB — PROTIME-INR
INR: 3.14
PROTHROMBIN TIME: 33 s — AB (ref 11.4–15.2)

## 2016-08-10 LAB — PHOSPHORUS: Phosphorus: 5.8 mg/dL — ABNORMAL HIGH (ref 2.5–4.6)

## 2016-08-10 LAB — MRSA PCR SCREENING: MRSA BY PCR: NEGATIVE

## 2016-08-10 MED ORDER — GABAPENTIN 100 MG PO CAPS
100.0000 mg | ORAL_CAPSULE | ORAL | Status: DC
  Administered 2016-08-11 – 2016-08-16 (×4): 100 mg via ORAL
  Filled 2016-08-10 (×4): qty 1

## 2016-08-10 MED ORDER — PAROXETINE HCL 20 MG PO TABS
40.0000 mg | ORAL_TABLET | Freq: Every day | ORAL | Status: DC
  Administered 2016-08-10: 40 mg via ORAL
  Filled 2016-08-10: qty 2

## 2016-08-10 MED ORDER — ONDANSETRON HCL 4 MG/2ML IJ SOLN: 4.0000 mg | Freq: Four times a day (QID) | INTRAMUSCULAR | Status: DC | PRN

## 2016-08-10 MED ORDER — CALCIUM ACETATE (PHOS BINDER) 667 MG PO CAPS
1334.0000 mg | ORAL_CAPSULE | Freq: Three times a day (TID) | ORAL | Status: DC
  Administered 2016-08-10 – 2016-08-17 (×20): 1334 mg via ORAL
  Filled 2016-08-10 (×20): qty 2

## 2016-08-10 MED ORDER — PRAVASTATIN SODIUM 40 MG PO TABS
40.0000 mg | ORAL_TABLET | Freq: Every evening | ORAL | Status: DC
  Administered 2016-08-10 – 2016-08-17 (×8): 40 mg via ORAL
  Filled 2016-08-10 (×8): qty 1

## 2016-08-10 MED ORDER — GABAPENTIN 300 MG PO CAPS
300.0000 mg | ORAL_CAPSULE | ORAL | Status: DC
  Administered 2016-08-10 – 2016-08-14 (×3): 300 mg via ORAL
  Filled 2016-08-10 (×3): qty 1

## 2016-08-10 MED ORDER — ACETAMINOPHEN 325 MG PO TABS: 650.0000 mg | ORAL_TABLET | Freq: Four times a day (QID) | ORAL | Status: DC | PRN

## 2016-08-10 MED ORDER — WARFARIN - PHARMACIST DOSING INPATIENT: Freq: Every day | Status: DC

## 2016-08-10 MED ORDER — SODIUM CHLORIDE 0.9 % IV SOLN: 100.0000 mL | INTRAVENOUS | Status: DC | PRN

## 2016-08-10 MED ORDER — HEPARIN SODIUM (PORCINE) 1000 UNIT/ML DIALYSIS: 1000.0000 [IU] | INTRAMUSCULAR | Status: DC | PRN

## 2016-08-10 MED ORDER — ONDANSETRON HCL 4 MG PO TABS: 4.0000 mg | ORAL_TABLET | Freq: Four times a day (QID) | ORAL | Status: DC | PRN

## 2016-08-10 MED ORDER — FAMOTIDINE 20 MG PO TABS
20.0000 mg | ORAL_TABLET | Freq: Every day | ORAL | Status: DC
  Administered 2016-08-10 – 2016-08-17 (×7): 20 mg via ORAL
  Filled 2016-08-10 (×7): qty 1

## 2016-08-10 MED ORDER — ALBUTEROL SULFATE (2.5 MG/3ML) 0.083% IN NEBU: 3.0000 mL | INHALATION_SOLUTION | Freq: Four times a day (QID) | RESPIRATORY_TRACT | Status: DC | PRN

## 2016-08-10 MED ORDER — LIDOCAINE HCL (PF) 1 % IJ SOLN
5.0000 mL | INTRAMUSCULAR | Status: DC | PRN
  Filled 2016-08-10: qty 5

## 2016-08-10 MED ORDER — INSULIN ASPART 100 UNIT/ML ~~LOC~~ SOLN
0.0000 [IU] | Freq: Three times a day (TID) | SUBCUTANEOUS | Status: DC
  Administered 2016-08-10: 2 [IU] via SUBCUTANEOUS
  Administered 2016-08-11 (×2): 1 [IU] via SUBCUTANEOUS
  Administered 2016-08-13: 2 [IU] via SUBCUTANEOUS
  Administered 2016-08-15: 1 [IU] via SUBCUTANEOUS
  Administered 2016-08-15: 2 [IU] via SUBCUTANEOUS
  Administered 2016-08-16 (×2): 1 [IU] via SUBCUTANEOUS
  Administered 2016-08-16 – 2016-08-17 (×3): 2 [IU] via SUBCUTANEOUS
  Filled 2016-08-10 (×2): qty 1
  Filled 2016-08-10 (×2): qty 2
  Filled 2016-08-10: qty 1
  Filled 2016-08-10 (×3): qty 2
  Filled 2016-08-10 (×2): qty 1
  Filled 2016-08-10: qty 2

## 2016-08-10 MED ORDER — INSULIN ASPART 100 UNIT/ML ~~LOC~~ SOLN: 0.0000 [IU] | Freq: Every day | SUBCUTANEOUS | Status: DC

## 2016-08-10 MED ORDER — PENTAFLUOROPROP-TETRAFLUOROETH EX AERO
1.0000 "application " | INHALATION_SPRAY | CUTANEOUS | Status: DC | PRN
  Filled 2016-08-10: qty 30

## 2016-08-10 MED ORDER — TRAMADOL HCL 50 MG PO TABS
50.0000 mg | ORAL_TABLET | Freq: Four times a day (QID) | ORAL | Status: DC | PRN
  Administered 2016-08-10 – 2016-08-15 (×3): 50 mg via ORAL
  Filled 2016-08-10 (×3): qty 1

## 2016-08-10 MED ORDER — ACETAMINOPHEN 650 MG RE SUPP: 650.0000 mg | Freq: Four times a day (QID) | RECTAL | Status: DC | PRN

## 2016-08-10 MED ORDER — LIDOCAINE-PRILOCAINE 2.5-2.5 % EX CREA
1.0000 "application " | TOPICAL_CREAM | CUTANEOUS | Status: DC | PRN
  Filled 2016-08-10: qty 5

## 2016-08-10 MED ORDER — PIPERACILLIN-TAZOBACTAM 3.375 G IVPB 30 MIN
3.3750 g | Freq: Once | INTRAVENOUS | Status: AC
  Administered 2016-08-10: 3.375 g via INTRAVENOUS

## 2016-08-10 MED ORDER — EPOETIN ALFA 10000 UNIT/ML IJ SOLN
4000.0000 [IU] | INTRAMUSCULAR | Status: DC
  Administered 2016-08-12 – 2016-08-17 (×3): 4000 [IU] via INTRAVENOUS
  Filled 2016-08-10 (×3): qty 1

## 2016-08-10 MED ORDER — DILTIAZEM HCL ER COATED BEADS 120 MG PO CP24
120.0000 mg | ORAL_CAPSULE | ORAL | Status: DC
  Administered 2016-08-10 – 2016-08-17 (×6): 120 mg via ORAL
  Filled 2016-08-10 (×6): qty 1

## 2016-08-10 MED ORDER — LINEZOLID 600 MG/300ML IV SOLN
600.0000 mg | Freq: Two times a day (BID) | INTRAVENOUS | Status: DC
  Administered 2016-08-10 (×2): 600 mg via INTRAVENOUS
  Filled 2016-08-10 (×3): qty 300

## 2016-08-10 MED ORDER — PIPERACILLIN-TAZOBACTAM 3.375 G IVPB
3.3750 g | Freq: Two times a day (BID) | INTRAVENOUS | Status: DC
  Administered 2016-08-10: 3.375 g via INTRAVENOUS
  Filled 2016-08-10: qty 50

## 2016-08-10 MED ORDER — ALTEPLASE 2 MG IJ SOLR: 2.0000 mg | Freq: Once | INTRAMUSCULAR | Status: DC | PRN

## 2016-08-10 NOTE — Consult Note (Signed)
[facility] Infectious Disease     Reason for Consult: L2-3 discitis    Referring Physician: Tressia Miners Date of Admission:  08/09/2016   Principal Problem:   Discitis Active Problems:   End stage renal disease on dialysis ([place])   Diabetes mellitus ([place])   Chronic atrial fibrillation (HCC)   Heel ulcer (HCC)   HPI: NALA KACHEL is a 83 y.o. female admitted 3/11 with acute on chronic back pain and found to have possible discitis on CT abd pelvis. She has ESRD and A fib, Gout, IDDM, neuropathy and prior CVA. She has had a non healing heel ulcer and has had multiple cultures growing multiple organisms, including VRE morganella enterobacter and Proteus. However she has not had any positive blood cultures which I can see.  She was started on linezolid and Zosyn. She denies any recent issues with her R Arm HD site, nor recent infections.   Past Medical History:  Diagnosis Date  . Anemia   . Anxiety   . Arthritis    gout  . Atrial fibrillation ([place])   . CHF (congestive heart failure) ([place])    has had this off and on prior to dialysis but has had several times since  . Chronic kidney disease    end stage renal insufficiency.  on dialysis M,W,F  . Diabetes mellitus without complication ([place])   . Dialysis patient [facility])    Mon.- Wed- Fri.  . Dysrhythmia    afib/flutter  . GERD (gastroesophageal reflux disease)   . Hypertension   . Neuropathy due to type 2 diabetes mellitus ([place])   . Peripheral vascular disease ([place])   . Pleural effusion   . Pulmonary hypertension   . Renal insufficiency   . Restless leg syndrome   . Shortness of breath dyspnea    with exertion  . Stroke ([place]) 06/2015   has had 1 other stroke. first one affected vision. the most recent one affected right arm   Past Surgical History:  Procedure Laterality Date  . AMPUTATION Right 11/07/2015   Procedure: AMPUTATION DIGIT ( 4TH TOE, RIGHT FOOT );  Surgeon: Algernon Huxley, MD;  Location: [facility] ORS;  Service: Vascular;   Laterality: Right;  . AMPUTATION TOE Left 06/07/2015   Procedure: AMPUTATION TOE;  Surgeon: Samara Deist, DPM;  Location: [facility] ORS;  Service: Podiatry;  Laterality: Left;  . CARDIAC CATHETERIZATION    . CATARACT EXTRACTION, BILATERAL Bilateral   . CHOLECYSTECTOMY    . DILATION AND CURETTAGE OF UTERUS    . ENDARTERECTOMY Left 07/26/2015   Procedure: ENDARTERECTOMY CAROTID;  Surgeon: Katha Cabal, MD;  Location: [facility] OR[facility];  Service: Vascular;  Laterality: Left;  . EYE SURGERY    . FISTULAGRAM ([place] HX)    . HEEL DECUBITUS ULCER EXCISION Right 05/13/2016  . INCISION AND DRAINAGE ABSCESS Right 02/16/2016   Procedure: INCISION AND DRAINAGE ABSCESS and application wound vac;  Surgeon: Evaristo Bury, MD;  Location: [facility] OR[facility];  Service: Urology;  Laterality: Right;  . JOINT REPLACEMENT     bilateral hip replacement  . MEDTRONIC BLADDER INTERSTEM     currently turned off for MRI in January  . PARATHYROIDECTOMY    . PERIPHERAL VASCULAR CATHETERIZATION N/A 10/08/2014   Procedure: A/V Shuntogram/Fistulagram;  Surgeon: Algernon Huxley, MD;  Location: [place] [facility];  Service: Cardiovascular;  Laterality: N/A;  . PERIPHERAL VASCULAR CATHETERIZATION N/A 03/04/2015   Procedure: A/V Shuntogram/Fistulagram;  Surgeon: Algernon Huxley, MD;  Location: [facility] [facility];  Service: Cardiovascular;  Laterality: N/A;  . PERIPHERAL VASCULAR CATHETERIZATION N/A 03/04/2015   Procedure: A/V Shunt Intervention;  Surgeon: Algernon Huxley, MD;  Location: [place] [facility];  Service: Cardiovascular;  Laterality: N/A;  . PERIPHERAL VASCULAR CATHETERIZATION N/A 09/16/2015   Procedure: Lower Extremity Angiography;  Surgeon: Algernon Huxley, MD;  Location: [place] [facility];  Service: Cardiovascular;  Laterality: N/A;  . PERIPHERAL VASCULAR CATHETERIZATION  09/16/2015   Procedure: Lower Extremity Intervention;  Surgeon: Algernon Huxley, MD;  Location: [place] [facility];  Service: Cardiovascular;;  . PERIPHERAL VASCULAR  CATHETERIZATION Right 04/27/2016   Procedure: A/V Shuntogram/Fistulagram;  Surgeon: Algernon Huxley, MD;  Location: [facility];  Service: Cardiovascular;  Laterality: Right;  . PERIPHERAL VASCULAR CATHETERIZATION Left 05/14/2016   Procedure: DIALYSIS/PERMA CATHETER INSERTION;  Surgeon: Algernon Huxley, MD;  Location: [facility];  Service: Vascular;  Laterality: Left;  . PERIPHERAL VASCULAR CATHETERIZATION N/A 06/08/2016   Procedure: Dialysis/Perma Catheter Removal;  Surgeon: Algernon Huxley, MD;  Location: [place] [facility];  Service: Cardiovascular;  Laterality: N/A;  . REVISON OF ARTERIOVENOUS FISTULA Right 05/14/2016   Procedure: REVISON OF ARTERIOVENOUS FISTULA ( WITH ARTEGRAFT );  Surgeon: Algernon Huxley, MD;  Location: [facility];  Service: Vascular;  Laterality: Right;  . THYROIDECTOMY, PARTIAL    . TONSILLECTOMY    . WOUND DEBRIDEMENT Right 11/07/2015   Procedure: DEBRIDEMENT WOUND ( RIGHT HEEL AND ABDOMINAL WOUND (2) );  Surgeon: Algernon Huxley, MD;  Location: [facility];  Service: Vascular;  Laterality: Right;  . WOUND DEBRIDEMENT N/A 11/28/2015   Procedure: DEBRIDEMENT WOUND ( ABDOMINAL );  Surgeon: Algernon Huxley, MD;  Location: [facility] OR[facility];  Service: Vascular;  Laterality: N/A;   Social History  Substance Use Topics  . Smoking status: Never Smoker  . Smokeless tobacco: Never Used  . Alcohol use No   Family History  Problem Relation Age of Onset  . Colon cancer Mother   . Stroke Father   . Diabetes Brother     Allergies:  Allergies  Allergen Reactions  . Codeine Itching and Other (See Comments)    Reaction:  Confusion/Hallucinations   . Contrast Media [Iodinated Diagnostic Agents] Other (See Comments)    "skin peel" breaks into a blistered rash, then peel. If given Benadryl and solumedrol she will be okay  . Oxycodone Other (See Comments)    Reaction:  Confusion/Hallucinations   . Quinine Derivatives Rash  . Tape Other (See Comments)    Skin is very thin and will blister and pull off  skin with any tape except paper tape.  PLEASE USE PAPER TAPE.    Current antibiotics: Antibiotics Given (last 72 hours)    Date/Time Action Medication Dose Rate   08/09/16 2310 Given   cefTRIAXone (ROCEPHIN) IVPB 1 g 1 g 100 mL/hr   08/10/16 0356 Given   linezolid (ZYVOX) IVPB 600 mg 600 mg 300 mL/hr   08/10/16 1525 Given   piperacillin-tazobactam (ZOSYN) IVPB 3.375 g 3.375 g 12.5 mL/hr      MEDICATIONS: . calcium acetate  1,334 mg Oral TID WC  . diltiazem  120 mg Oral BH-q7a  . [START ON 08/12/2016] epoetin (EPOGEN/PROCRIT) injection  4,000 Units Intravenous Q M,W,F-HD  . famotidine  20 mg Oral Daily  . [START ON 08/11/2016] gabapentin  100 mg Oral Q T,Th,S,Su  . gabapentin  300 mg Oral Q M,W,F-2000  . insulin aspart  0-5 Units Subcutaneous QHS  . insulin aspart  0-9  Units Subcutaneous TID WC  . linezolid (ZYVOX) IV  600 mg Intravenous Q12H  . piperacillin-tazobactam (ZOSYN)  IV  3.375 g Intravenous Q12H  . pravastatin  40 mg Oral QPM  . Warfarin - Pharmacist Dosing Inpatient   Does not apply q1800    Review of Systems - 11 systems reviewed and negative per HPI   OBJECTIVE: Temp:  [97.7 F (36.5 C)-98.5 F (36.9 C)] 98.3 F (36.8 C) (03/12 1507) Pulse Rate:  [54-97] 95 (03/12 1507) Resp:  [15-23] 20 (03/12 1507) BP: (110-144)/(44-94) 132/49 (03/12 1507) SpO2:  [94 %-100 %] 94 % (03/12 1507) Weight:  [69.9 kg (154 lb 1.6 oz)-72.6 kg (160 lb)] 69.9 kg (154 lb 1.6 oz) (03/12 1027) Physical Exam  Constitutional:  oriented to person, place, and time. appears well-developed and well-nourished. No distress.  HENT: [place]/AT, PERRLA, no scleral icterus Mouth/Throat: Oropharynx is clear and dry . No oropharyngeal exudate.  Cardiovascular: Normal rate, regular rhythm and normal heart sounds. Pulmonary/Chest: Effort normal and breath sounds normal. No respiratory distress.  has no wheezes.  Neck = supple, no nuchal rigidity Abdominal: Soft. Bowel sounds are normal.  exhibits no  distension. There is no tenderness.  Lymphadenopathy: no cervical adenopathy. No axillary adenopathy Neurological: alert and oriented to person, place, and time.  Ext R UE avf wnl Skin: Skin is warm and dry. R heel with eschar over achiles  Psychiatric: a normal mood and affect.  behavior is normal.    LABS: Results for orders placed or performed during the hospital encounter of 08/09/16 (from the past 48 hour(s))  Comprehensive metabolic panel     Status: Abnormal   Collection Time: 08/09/16  8:11 PM  Result Value Ref Range   Sodium 132 (L) 135 - 145 mmol/L   Potassium 6.1 (H) 3.5 - 5.1 mmol/L   Chloride 93 (L) 101 - 111 mmol/L   CO2 24 22 - 32 mmol/L   Glucose, Bld 127 (H) 65 - 99 mg/dL   BUN 65 (H) 6 - 20 mg/dL   Creatinine, Ser 5.46 (H) 0.44 - 1.00 mg/dL   Calcium 8.7 (L) 8.9 - 10.3 mg/dL   Total Protein 7.4 6.5 - 8.1 g/dL   Albumin 3.2 (L) 3.5 - 5.0 g/dL   AST 18 15 - 41 U/L   ALT 12 (L) 14 - 54 U/L   Alkaline Phosphatase 69 38 - 126 U/L   Total Bilirubin 0.6 0.3 - 1.2 mg/dL   GFR calc non Af Amer 7 (L) >60 mL/min   GFR calc Af Amer 8 (L) >60 mL/min    Comment: (NOTE) The eGFR has been calculated using the CKD EPI equation. This calculation has not been validated in all clinical situations. eGFR's persistently <60 mL/min signify possible Chronic Kidney Disease.    Anion gap 15 5 - 15  CBC WITH DIFFERENTIAL     Status: Abnormal   Collection Time: 08/09/16  8:11 PM  Result Value Ref Range   WBC 7.5 3.6 - 11.0 K/uL   RBC 3.00 (L) 3.80 - 5.20 MIL/uL   Hemoglobin 10.1 (L) 12.0 - 16.0 g/dL   HCT 29.0 (L) 35.0 - 47.0 %   MCV 96.7 80.0 - 100.0 fL   MCH 33.8 26.0 - 34.0 pg   MCHC 34.9 32.0 - 36.0 g/dL   RDW 15.4 (H) 11.5 - 14.5 %   Platelets 251 150 - 440 K/uL   Neutrophils Relative % 77 %   Neutro Abs 5.7 1.4 - 6.5 K/uL  Lymphocytes Relative 11 %   Lymphs Abs 0.8 (L) 1.0 - 3.6 K/uL   Monocytes Relative 9 %   Monocytes Absolute 0.7 0.2 - 0.9 K/uL   Eosinophils  Relative 2 %   Eosinophils Absolute 0.2 0 - 0.7 K/uL   Basophils Relative 1 %   Basophils Absolute 0.1 0 - 0.1 K/uL  Urinalysis, Complete w Microscopic     Status: Abnormal   Collection Time: 08/09/16 10:04 PM  Result Value Ref Range   Color, Urine AMBER (A) YELLOW    Comment: BIOCHEMICALS MAY BE AFFECTED BY COLOR   APPearance TURBID (A) CLEAR   Specific Gravity, Urine 1.014 1.005 - 1.030   pH 6.0 5.0 - 8.0   Glucose, UA NEGATIVE NEGATIVE mg/dL   Hgb urine dipstick MODERATE (A) NEGATIVE   Bilirubin Urine NEGATIVE NEGATIVE   Ketones, ur NEGATIVE NEGATIVE mg/dL   Protein, ur 100 (A) NEGATIVE mg/dL   Nitrite NEGATIVE NEGATIVE   Leukocytes, UA LARGE (A) NEGATIVE   RBC / HPF TOO NUMEROUS TO COUNT 0 - 5 RBC/hpf   WBC, UA TOO NUMEROUS TO COUNT 0 - 5 WBC/hpf   Bacteria, UA MANY (A) NONE SEEN   Squamous Epithelial / LPF 0-5 (A) NONE SEEN   WBC Clumps PRESENT   Blood culture (routine x 2)     Status: None (Preliminary result)   Collection Time: 08/09/16 11:54 PM  Result Value Ref Range   Specimen Description BLOOD LEFT UPPER ARM    Special Requests      BOTTLES DRAWN AEROBIC AND ANAEROBIC BCHV HIGH VOLUME   Culture NO GROWTH < 12 HOURS    Report Status PENDING   Blood culture (routine x 2)     Status: None (Preliminary result)   Collection Time: 08/09/16 11:59 PM  Result Value Ref Range   Specimen Description BLOOD LEFT FOREARM    Special Requests      BOTTLES DRAWN AEROBIC AND ANAEROBIC BCAV ADEQUATE VOLUME   Culture NO GROWTH < 12 HOURS    Report Status PENDING   Glucose, capillary     Status: Abnormal   Collection Time: 08/10/16  1:48 AM  Result Value Ref Range   Glucose-Capillary 117 (H) 65 - 99 mg/dL  MRSA PCR Screening     Status: None   Collection Time: 08/10/16  2:29 AM  Result Value Ref Range   MRSA by PCR NEGATIVE NEGATIVE    Comment:        The GeneXpert MRSA Assay (FDA approved for NASAL specimens only), is one component of a comprehensive MRSA  colonization surveillance program. It is not intended to diagnose MRSA infection nor to guide or monitor treatment for MRSA infections.   Basic metabolic panel     Status: Abnormal   Collection Time: 08/10/16  3:27 AM  Result Value Ref Range   Sodium 127 (L) 135 - 145 mmol/L   Potassium 5.6 (H) 3.5 - 5.1 mmol/L   Chloride 91 (L) 101 - 111 mmol/L   CO2 22 22 - 32 mmol/L   Glucose, Bld 110 (H) 65 - 99 mg/dL   BUN 65 (H) 6 - 20 mg/dL   Creatinine, Ser 5.37 (H) 0.44 - 1.00 mg/dL   Calcium 8.9 8.9 - 10.3 mg/dL   GFR calc non Af Amer 7 (L) >60 mL/min   GFR calc Af Amer 8 (L) >60 mL/min    Comment: (NOTE) The eGFR has been calculated using the CKD EPI equation. This calculation has not been validated  in all clinical situations. eGFR's persistently <60 mL/min signify possible Chronic Kidney Disease.    Anion gap 14 5 - 15  CBC     Status: Abnormal   Collection Time: 08/10/16  3:27 AM  Result Value Ref Range   WBC 8.0 3.6 - 11.0 K/uL   RBC 2.99 (L) 3.80 - 5.20 MIL/uL   Hemoglobin 10.1 (L) 12.0 - 16.0 g/dL   HCT 29.1 (L) 35.0 - 47.0 %   MCV 97.2 80.0 - 100.0 fL   MCH 33.7 26.0 - 34.0 pg   MCHC 34.6 32.0 - 36.0 g/dL   RDW 15.5 (H) 11.5 - 14.5 %   Platelets 252 150 - 440 K/uL  Protime-INR     Status: Abnormal   Collection Time: 08/10/16  3:27 AM  Result Value Ref Range   Prothrombin Time 33.0 (H) 11.4 - 15.2 seconds   INR 3.14   Glucose, capillary     Status: Abnormal   Collection Time: 08/10/16  7:50 AM  Result Value Ref Range   Glucose-Capillary 162 (H) 65 - 99 mg/dL   Comment 1 Notify RN   Phosphorus     Status: Abnormal   Collection Time: 08/10/16 10:40 AM  Result Value Ref Range   Phosphorus 5.8 (H) 2.5 - 4.6 mg/dL  Glucose, capillary     Status: None   Collection Time: 08/10/16 12:55 PM  Result Value Ref Range   Glucose-Capillary 81 65 - 99 mg/dL  Glucose, capillary     Status: None   Collection Time: 08/10/16  3:10 PM  Result Value Ref Range   Glucose-Capillary  88 65 - 99 mg/dL   No components found for: ESR, C REACTIVE PROTEIN MICRO: Recent Results (from the past 720 hour(s))  Blood culture (routine x 2)     Status: None (Preliminary result)   Collection Time: 08/09/16 11:54 PM  Result Value Ref Range Status   Specimen Description BLOOD LEFT UPPER ARM  Final   Special Requests   Final    BOTTLES DRAWN AEROBIC AND ANAEROBIC BCHV HIGH VOLUME   Culture NO GROWTH < 12 HOURS  Final   Report Status PENDING  Incomplete  Blood culture (routine x 2)     Status: None (Preliminary result)   Collection Time: 08/09/16 11:59 PM  Result Value Ref Range Status   Specimen Description BLOOD LEFT FOREARM  Final   Special Requests   Final    BOTTLES DRAWN AEROBIC AND ANAEROBIC BCAV ADEQUATE VOLUME   Culture NO GROWTH < 12 HOURS  Final   Report Status PENDING  Incomplete  MRSA PCR Screening     Status: None   Collection Time: 08/10/16  2:29 AM  Result Value Ref Range Status   MRSA by PCR NEGATIVE NEGATIVE Final    Comment:        The GeneXpert MRSA Assay (FDA approved for NASAL specimens only), is one component of a comprehensive MRSA colonization surveillance program. It is not intended to diagnose MRSA infection nor to guide or monitor treatment for MRSA infections.     IMAGING: Ct Abdomen Pelvis Wo Contrast  Result Date: 08/09/2016 CLINICAL DATA:  Lower abdominal pain for 1 week, increasing today. Nausea. Allergy to intravenous contrast material. Oral contrast with barium. EXAM: CT ABDOMEN AND PELVIS WITHOUT CONTRAST TECHNIQUE: Multidetector CT imaging of the abdomen and pelvis was performed following the standard protocol without IV contrast. COMPARISON:  10/12/2015.  09/14/2015. FINDINGS: Lower chest: Minimal bilateral pleural effusions. Atelectasis and scarring in the  lung bases. Calcified granulomas in both lung bases. Noncalcified nodule in the right lung base measures 9 mm diameter. No significant change since prior study. Calcification of the  mitral valve annulus. Cardiac enlargement. Small esophageal hiatal hernia. Hepatobiliary: Suggestion of hepatic cirrhosis with nodular contour to the liver. No focal lesions are demonstrated on noncontrast imaging. Gallbladder is surgically absent. No bile duct dilatation. Pancreas: Fatty infiltration of the pancreas. No pancreatic ductal dilatation or infiltration. Spleen: Spleen size is normal.  No focal lesions. Adrenals/Urinary Tract: No adrenal gland nodules. Bilateral renal parenchymal atrophy with vascular calcifications along the renal arteries and arterials. Large calcification in the upper pole right kidney measuring 12 mm. No change since prior study. No hydronephrosis or hydroureter. No ureteral stones identified. Evaluation of the bladder is severely limited due to streak artifact from bilateral hip arthroplasties. Stomach/Bowel: Stomach, small bowel, and colon are not abnormally distended. No discrete wall thickening is appreciated. Scattered stool in the colon. Appendix is normal. Vascular/Lymphatic: Extensive vascular calcifications throughout the abdomen and pelvis involving all major arterial structures, including the abdominal aorta and branch vessels. No aortic aneurysm. IVC is normal in caliber. No significant lymphadenopathy. Reproductive: Uterus and bilateral adnexa are unremarkable. Other: Evaluation of pelvic organs is severely limited by streak artifact from bilateral hip arthroplasties. No free fluid or free air in the abdomen. Ventral abdominal wall hernia containing fat and measuring about 2.7 cm maximal diameter. This is new since prior study. No bowel herniation. Generator pack for stimulator device in the soft tissues posterior to the right pelvis. Musculoskeletal: There is increasing bone destruction and sclerosis of the endplates adjacent to the L2-3 interspace level. There is apparent bone destruction of the endplates. There is soft tissue swelling of the adjacent paraspinal  muscles and psoas muscles, increased since prior study. This is suspicious for discitis/ osteomyelitis and surrounding inflammatory reaction. No definite loculated fluid collection identified. Diffuse degenerative change throughout the lumbar spine. IMPRESSION: 1. Described changes at L2-3 interspace suspicious for discitis/ osteomyelitis and inflammatory changes in the adjacent paraspinal and psoas muscles. 2. Small bilateral pleural effusions with basilar atelectasis. Small esophageal hiatal hernia. 9 mm nodule in the right lung base is unchanged since prior study. 3. Large nonobstructing stone in the superior pole right kidney. 4. Extensive aortic atherosclerosis and calcification throughout the abdominal vasculature. 5. New ventral abdominal wall hernia containing fat. These results were called by telephone at the time of interpretation on 08/09/2016 at 11:21 pm to Dr. Lenise Arena , who verbally acknowledged these results. Electronically Signed   By: Lucienne Capers M.D.   On: 08/09/2016 23:24    Assessment:   GERRICA CYGAN is a 83 y.o. female admitted with back pain and found on CT imaging to have possible L2-3 discitis and surrounding phlegmon. SHe is ESRD and is on HD but has not had any issues with HD access infection now recent bacteremias. She does have a chronic heel ulcer but this appears relatively dry and not actively infection. She had prior CT abd 2017 with some sclerotic changes noted.  Apparently MRI cannot be done locally due to a spinal device she has in place.  I think that she will need further work up with MRI and possibly aspiration of infected area for culture and pathology.   Recommendations Will review imaging with IR and discuss whether MRI would be helpful or if aspiration is possible I have stopped her abx for now since we have no cultures to show [place] what to  direct therapy to Check esr and CRP  Thank you very much for allowing me to participate in the care of this  patient. Please call with questions.   Cheral Marker. Ola Spurr, MD

## 2016-08-10 NOTE — Progress Notes (Signed)
Hemodialysis completed. 

## 2016-08-10 NOTE — Progress Notes (Signed)
Post hd tx 

## 2016-08-10 NOTE — Progress Notes (Signed)
[facility]  ROUNDING NOTE   Subjective:  Patient admitted with abdominal pain. She is known to us from prior admissions. Patient seen and evaluated during hemodialysis today. She appears to be tolerating well. In addition imaging showed possible discitis.   Objective:  Vital signs in last 24 hours:  Temp:  [97.7 F (36.5 C)-98.5 F (36.9 C)] 98.2 F (36.8 C) (03/12 1027) Pulse Rate:  [54-94] 94 (03/12 1330) Resp:  [15-23] 16 (03/12 1330) BP: (110-144)/(44-94) 117/47 (03/12 1330) SpO2:  [94 %-100 %] 98 % (03/12 1330) Weight:  [69.9 kg (154 lb 1.6 oz)-72.6 kg (160 lb)] 69.9 kg (154 lb 1.6 oz) (03/12 1027)  Weight change:  Filed Weights   08/09/16 2009 08/10/16 0146 08/10/16 1027  Weight: 72.6 kg (160 lb) 69.9 kg (154 lb 3.2 oz) 69.9 kg (154 lb 1.6 oz)    Intake/Output: I/O last 3 completed shifts: In: 520 [P.O.:220; IV Piggyback:300] Out: 1 [Stool:1]   Intake/Output this shift:  No intake/output data recorded.  Physical Exam: General: No acute distress  Head: Normocephalic, atraumatic. Moist oral mucosal membranes  Eyes: Anicteric  Neck: Supple, trachea midline  Lungs:  Clear to auscultation, normal effort  Heart: S1S2 no rubs  Abdomen:  Soft, nontender, bowel sounds present  Extremities: 1+ peripheral edema, RLE ulcers covered  Neurologic: Nonfocal, moving all four extremities  Skin: No lesions  Access: RUE AVF    Basic Metabolic Panel:  Recent Labs Lab 08/09/16 2011 08/10/16 0327 08/10/16 1040  NA 132* 127*  --   K 6.1* 5.6*  --   CL 93* 91*  --   CO2 24 22  --   GLUCOSE 127* 110*  --   BUN 65* 65*  --   CREATININE 5.46* 5.37*  --   CALCIUM 8.7* 8.9  --   PHOS  --   --  5.8*    Liver Function Tests:  Recent Labs Lab 08/09/16 2011  AST 18  ALT 12*  ALKPHOS 69  BILITOT 0.6  PROT 7.4  ALBUMIN 3.2*   No results for input(s): LIPASE, AMYLASE in the last 168 hours. No results for input(s): AMMONIA in the last 168  hours.  CBC:  Recent Labs Lab 08/09/16 2011 08/10/16 0327  WBC 7.5 8.0  NEUTROABS 5.7  --   HGB 10.1* 10.1*  HCT 29.0* 29.1*  MCV 96.7 97.2  PLT 251 252    Cardiac Enzymes: No results for input(s): CKTOTAL, CKMB, CKMBINDEX, TROPONINI in the last 168 hours.  BNP: Invalid input(s): POCBNP  CBG:  Recent Labs Lab 08/10/16 0148 08/10/16 0750 08/10/16 1255  GLUCAP 117* 162* 81    Microbiology: Results for orders placed or performed during the hospital encounter of 08/09/16  Blood culture (routine x 2)     Status: None (Preliminary result)   Collection Time: 08/09/16 11:54 PM  Result Value Ref Range Status   Specimen Description BLOOD LEFT UPPER ARM  Final   Special Requests   Final    BOTTLES DRAWN AEROBIC AND ANAEROBIC BCHV HIGH VOLUME   Culture NO GROWTH < 12 HOURS  Final   Report Status PENDING  Incomplete  Blood culture (routine x 2)     Status: None (Preliminary result)   Collection Time: 08/09/16 11:59 PM  Result Value Ref Range Status   Specimen Description BLOOD LEFT FOREARM  Final   Special Requests   Final    BOTTLES DRAWN AEROBIC AND ANAEROBIC BCAV ADEQUATE VOLUME   Culture NO GROWTH < 12  HOURS  Final   Report Status PENDING  Incomplete  MRSA PCR Screening     Status: None   Collection Time: 08/10/16  2:29 AM  Result Value Ref Range Status   MRSA by PCR NEGATIVE NEGATIVE Final    Comment:        The GeneXpert MRSA Assay (FDA approved for NASAL specimens only), is one component of a comprehensive MRSA colonization surveillance program. It is not intended to diagnose MRSA infection nor to guide or monitor treatment for MRSA infections.     Coagulation Studies:  Recent Labs  08/10/16 0327  LABPROT 33.0*  INR 3.14    Urinalysis:  Recent Labs  08/09/16 2204  COLORURINE AMBER*  LABSPEC 1.014  PHURINE 6.0  GLUCOSEU NEGATIVE  HGBUR MODERATE*  BILIRUBINUR NEGATIVE  KETONESUR NEGATIVE  PROTEINUR 100*  NITRITE NEGATIVE  LEUKOCYTESUR  LARGE*      Imaging: Ct Abdomen Pelvis Wo Contrast  Result Date: 08/09/2016 CLINICAL DATA:  Lower abdominal pain for 1 week, increasing today. Nausea. Allergy to intravenous contrast material. Oral contrast with barium. EXAM: CT ABDOMEN AND PELVIS WITHOUT CONTRAST TECHNIQUE: Multidetector CT imaging of the abdomen and pelvis was performed following the standard protocol without IV contrast. COMPARISON:  10/12/2015.  09/14/2015. FINDINGS: Lower chest: Minimal bilateral pleural effusions. Atelectasis and scarring in the lung bases. Calcified granulomas in both lung bases. Noncalcified nodule in the right lung base measures 9 mm diameter. No significant change since prior study. Calcification of the mitral valve annulus. Cardiac enlargement. Small esophageal hiatal hernia. Hepatobiliary: Suggestion of hepatic cirrhosis with nodular contour to the liver. No focal lesions are demonstrated on noncontrast imaging. Gallbladder is surgically absent. No bile duct dilatation. Pancreas: Fatty infiltration of the pancreas. No pancreatic ductal dilatation or infiltration. Spleen: Spleen size is normal.  No focal lesions. Adrenals/Urinary Tract: No adrenal gland nodules. Bilateral renal parenchymal atrophy with vascular calcifications along the renal arteries and arterials. Large calcification in the upper pole right kidney measuring 12 mm. No change since prior study. No hydronephrosis or hydroureter. No ureteral stones identified. Evaluation of the bladder is severely limited due to streak artifact from bilateral hip arthroplasties. Stomach/Bowel: Stomach, small bowel, and colon are not abnormally distended. No discrete wall thickening is appreciated. Scattered stool in the colon. Appendix is normal. Vascular/Lymphatic: Extensive vascular calcifications throughout the abdomen and pelvis involving all major arterial structures, including the abdominal aorta and branch vessels. No aortic aneurysm. IVC is normal in caliber.  No significant lymphadenopathy. Reproductive: Uterus and bilateral adnexa are unremarkable. Other: Evaluation of pelvic organs is severely limited by streak artifact from bilateral hip arthroplasties. No free fluid or free air in the abdomen. Ventral abdominal wall hernia containing fat and measuring about 2.7 cm maximal diameter. This is new since prior study. No bowel herniation. Generator pack for stimulator device in the soft tissues posterior to the right pelvis. Musculoskeletal: There is increasing bone destruction and sclerosis of the endplates adjacent to the L2-3 interspace level. There is apparent bone destruction of the endplates. There is soft tissue swelling of the adjacent paraspinal muscles and psoas muscles, increased since prior study. This is suspicious for discitis/ osteomyelitis and surrounding inflammatory reaction. No definite loculated fluid collection identified. Diffuse degenerative change throughout the lumbar spine. IMPRESSION: 1. Described changes at L2-3 interspace suspicious for discitis/ osteomyelitis and inflammatory changes in the adjacent paraspinal and psoas muscles. 2. Small bilateral pleural effusions with basilar atelectasis. Small esophageal hiatal hernia. 9 mm nodule in the right lung base  is unchanged since prior study. 3. Large nonobstructing stone in the superior pole right kidney. 4. Extensive aortic atherosclerosis and calcification throughout the abdominal vasculature. 5. New ventral abdominal wall hernia containing fat. These results were called by telephone at the time of interpretation on 08/09/2016 at 11:21 pm to Dr. Daryel November , who verbally acknowledged these results. Electronically Signed   By: Burman Nieves M.D.   On: 08/09/2016 23:24     Medications:    . calcium acetate  1,334 mg Oral TID WC  . diltiazem  120 mg Oral BH-q7a  . famotidine  20 mg Oral Daily  . [START ON 08/11/2016] gabapentin  100 mg Oral Q T,Th,S,Su  . gabapentin  300 mg Oral  Q M,W,F-2000  . insulin aspart  0-5 Units Subcutaneous QHS  . insulin aspart  0-9 Units Subcutaneous TID WC  . linezolid (ZYVOX) IV  600 mg Intravenous Q12H  . piperacillin-tazobactam (ZOSYN)  IV  3.375 g Intravenous Q12H  . pravastatin  40 mg Oral QPM  . Warfarin - Pharmacist Dosing Inpatient   Does not apply q1800   sodium chloride, sodium chloride, acetaminophen **OR** acetaminophen, albuterol, alteplase, heparin, lidocaine (PF), lidocaine-prilocaine, ondansetron **OR** ondansetron (ZOFRAN) IV, pentafluoroprop-tetrafluoroeth, traMADol  Assessment/ Plan:  83 y.o. female with atrial fibrillation on Coumadin, hypertension, gout, insulin-dependent diabetes mellitus type 2, diabetic neuropathy, CVA with right visual field blindness, hyperlipidemia, major depressive disorder, overactive bladder with incontinence, generalized anxiety disorder, congestive heart failure diastolic, parathyroidectomy, and osteoarthritis  MWF [facility] [address]   1. End Stage Renal Disease:  - Patient seen and evaluated during hemodialysis. Tolerating well at this moment. We will plan for dialysis again on Wednesday.  2. Discitis. Noted on imaging upon admission.  Continue Lovaza lid as well as Zosyn.  3. Hypertension:   continue diltiazem. Blood pressure well controlled.  4. Secondary Hyperparathyroidism: Continue PhosLo 2 tablets by mouth 3 times a day with meals.  5. Anemia of chronic kidney disease:  Hemoglobin 10.1. Start the patient on Epogen 4000 units IV with dialysis.   LOS: 0 Makylee Sanborn 3/12/20181:57 PM

## 2016-08-10 NOTE — H&P (Signed)
[facility] [place] at [facility]   PATIENT NAME: [name]    MR#:  [id number]  DATE OF BIRTH:  1934-02-19  DATE OF ADMISSION:  08/09/2016  PRIMARY CARE PHYSICIAN: [name], MD   REQUESTING/REFERRING PHYSICIAN: Mayford Knife, MD  CHIEF COMPLAINT:   Chief Complaint  Patient presents with  . Abdominal Pain    Lower groin and lower abdomen    HISTORY OF PRESENT ILLNESS:  [name]  is a 83 y.o. female who presents with Back and abdominal pain. Patient has chronic back pain, but states that for the last several days her pain has been increased, and different. She's also been having abdominal pain. Workup. Night does not show any abdominal pathology, but does show discitis. Patient has a chronic right lower extremity wound which has been healing over the past year or so. In the past this wound has grown multiple resistant bacteria - including Morganella and VRE, and she was being seen by infectious disease at some point in the past for the same and received long-term antibiotics. Patient denies any fevers or chills, and has labs largely within normal limits in the ED tonight. Hospitalists were called for admission.  PAST MEDICAL HISTORY:   Past Medical History:  Diagnosis Date  . Anemia   . Anxiety   . Arthritis    gout  . Atrial fibrillation (HCC)   . CHF (congestive heart failure) (HCC)    has had this off and on prior to dialysis but has had several times since  . Chronic kidney disease    end stage renal insufficiency.  on dialysis M,W,F  . Diabetes mellitus without complication (HCC)   . Dialysis patient [facility])    Mon.- Wed- Fri.  . Dysrhythmia    afib/flutter  . GERD (gastroesophageal reflux disease)   . Hypertension   . 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 Peripheral vascular disease (HCC)   . Pleural effusion   . Pulmonary hypertension   . Renal insufficiency   . Restless leg syndrome   . Shortness of breath  dyspnea    with exertion  . Stroke (HCC) 06/2015   has had 1 other stroke. first one affected vision. the most recent one affected right arm    PAST SURGICAL HISTORY:   Past Surgical History:  Procedure Laterality Date  . AMPUTATION Right 11/07/2015   Procedure: AMPUTATION DIGIT ( 4TH TOE, RIGHT FOOT );  Surgeon: Annice Needy, MD;  Location: [facility] ORS;  Service: Vascular;  Laterality: Right;  . AMPUTATION TOE Left 06/07/2015   Procedure: AMPUTATION TOE;  Surgeon: Gwyneth Revels, DPM;  Location: [facility] ORS;  Service: Podiatry;  Laterality: Left;  . CARDIAC CATHETERIZATION    . CATARACT EXTRACTION, BILATERAL Bilateral   . CHOLECYSTECTOMY    . DILATION AND CURETTAGE OF UTERUS    . ENDARTERECTOMY Left 07/26/2015   Procedure: ENDARTERECTOMY CAROTID;  Surgeon: Renford Dills, MD;  Location: [facility] ORS;  Service: Vascular;  Laterality: Left;  . EYE SURGERY    . FISTULAGRAM ([facility] HX)    . HEEL DECUBITUS ULCER EXCISION Right 05/13/2016  . INCISION AND DRAINAGE ABSCESS Right 02/16/2016   Procedure: INCISION AND DRAINAGE ABSCESS and application wound vac;  Surgeon: Bertram Denver, MD;  Location: [facility] ORS;  Service: Urology;  Laterality: Right;  . JOINT REPLACEMENT     bilateral hip replacement  . MEDTRONIC BLADDER INTERSTEM     currently turned off for MRI in January  .  PARATHYROIDECTOMY    . PERIPHERAL VASCULAR CATHETERIZATION N/A 10/08/2014   Procedure: A/V Shuntogram/Fistulagram;  Surgeon: Annice Needy, MD;  Location: [facility] INVASIVE CV LAB;  Service: Cardiovascular;  Laterality: N/A;  . PERIPHERAL VASCULAR CATHETERIZATION N/A 03/04/2015   Procedure: A/V Shuntogram/Fistulagram;  Surgeon: Annice Needy, MD;  Location: [facility] INV[facility] CV LAB;  Service: Cardiovascular;  Laterality: N/A;  . PERIPHERAL VASCULAR CATHETERIZATION N/A 03/04/2015   Procedure: A/V Shunt Intervention;  Surgeon: Annice Needy, MD;  Location: [facility] INV[facility] CV LAB;  Service: Cardiovascular;  Laterality: N/A;  . PERIPHERAL VASCULAR  CATHETERIZATION N/A 09/16/2015   Procedure: Lower Extremity Angiography;  Surgeon: Annice Needy, MD;  Location: [facility] LAB;  Service: Cardiovascular;  Laterality: N/A;  . PERIPHERAL VASCULAR CATHETERIZATION  09/16/2015   Procedure: Lower Extremity Intervention;  Surgeon: Annice Needy, MD;  Location: [facility] LAB;  Service: Cardiovascular;;  . PERIPHERAL VASCULAR CATHETERIZATION Right 04/27/2016   Procedure: A/V Shuntogram/Fistulagram;  Surgeon: Annice Needy, MD;  Location: [facility];  Service: Cardiovascular;  Laterality: Right;  . PERIPHERAL VASCULAR CATHETERIZATION Left 05/14/2016   Procedure: DIALYSIS/PERMA CATHETER INSERTION;  Surgeon: Annice Needy, MD;  Location: [facility] OR[facility];  Service: Vascular;  Laterality: Left;  . PERIPHERAL VASCULAR CATHETERIZATION N/A 06/08/2016   Procedure: Dialysis/Perma Catheter Removal;  Surgeon: Annice Needy, MD;  Location: [facility] LAB;  Service: Cardiovascular;  Laterality: N/A;  . REVISON OF ARTERIOVENOUS FISTULA Right 05/14/2016   Procedure: REVISON OF ARTERIOVENOUS FISTULA ( WITH ARTEGRAFT );  Surgeon: Annice Needy, MD;  Location: [facility] OR[facility];  Service: Vascular;  Laterality: Right;  . THYROIDECTOMY, PARTIAL    . TONSILLECTOMY    . WOUND DEBRIDEMENT Right 11/07/2015   Procedure: DEBRIDEMENT WOUND ( RIGHT HEEL AND ABDOMINAL WOUND (2) );  Surgeon: Annice Needy, MD;  Location: [facility] OR[facility];  Service: Vascular;  Laterality: Right;  . WOUND DEBRIDEMENT N/A 11/28/2015   Procedure: DEBRIDEMENT WOUND ( ABDOMINAL );  Surgeon: Annice Needy, MD;  Location: [facility] ORS;  Service: Vascular;  Laterality: N/A;    SOCIAL HISTORY:   Social History  Substance Use Topics  . Smoking status: Never Smoker  . Smokeless tobacco: Never Used  . Alcohol use No    FAMILY HISTORY:   Family History  Problem Relation Age of Onset  . Colon cancer Mother   . Stroke Father   . Diabetes Brother     DRUG ALLERGIES:   Allergies  Allergen Reactions  . Codeine Itching  and Other (See Comments)    Reaction:  Confusion/Hallucinations   . Contrast Media [Iodinated Diagnostic Agents] Other (See Comments)    "skin peel" breaks into a blistered rash, then peel. If given Benadryl and solumedrol she will be okay  . Oxycodone Other (See Comments)    Reaction:  Confusion/Hallucinations   . Quinine Derivatives Rash  . Tape Other (See Comments)    Skin is very thin and will blister and pull off skin with any tape except paper tape.  PLEASE USE PAPER TAPE.    MEDICATIONS AT HOME:   Prior to Admission medications   Medication Sig Start Date End Date Taking? Authorizing Provider  acetaminophen (TYLENOL) 650 MG CR tablet Take 1,300 mg by mouth 2 (two) times daily.   Yes Historical Provider, MD  albuterol (PROVENTIL HFA;VENTOLIN HFA) 108 (90 Base) MCG/ACT inhaler Inhale 1 puff into the lungs every 6 (six) hours as needed for wheezing or shortness of breath.    Yes  Historical Provider, MD  Biotin 1 MG CAPS Take 1 mg by mouth daily.    Yes Historical Provider, MD  calcium acetate (PHOSLO) 667 MG tablet Take 1,334 mg by mouth 3 (three) times daily with meals. With any food   Yes Historical Provider, MD  diltiazem (TIAZAC) 120 MG 24 hr capsule Take 120 mg by mouth every morning.    Yes Historical Provider, MD  folic acid-vitamin b complex-vitamin c-selenium-zinc (DIALYVITE) 3 MG TABS tablet Take 1 tablet by mouth daily.   Yes Historical Provider, MD  furosemide (LASIX) 80 MG tablet Take 80 mg by mouth 2 (two) times daily.   Yes Historical Provider, MD  gabapentin (NEURONTIN) 100 MG capsule Take 100 mg by mouth every Tuesday, Thursday, Saturday, and Sunday. At bedtime   Yes Historical Provider, MD  gabapentin (NEURONTIN) 300 MG capsule Take 300 mg by mouth every Monday, Wednesday, and Friday at 8 PM. At bedtime   Yes Historical Provider, MD  insulin detemir (LEVEMIR) 100 UNIT/ML injection Inject 0.06 mLs (6 Units total) into the skin 2 (two) times daily. Patient taking  differently: Inject 10 Units into the skin 2 (two) times daily.  02/18/16  Yes Adrian Saran, MD  lidocaine-prilocaine (EMLA) cream Apply 1 application topically as needed (port access). Mon, Wed, Fri before dialysis   Yes Historical Provider, MD  Omega-3 Fatty Acids (FISH OIL) 1000 MG CAPS Take 1,000 mg by mouth 2 (two) times daily.   Yes Historical Provider, MD  PARoxetine (PAXIL) 40 MG tablet Take 40 mg by mouth at bedtime.    Yes Historical Provider, MD  pravastatin (PRAVACHOL) 40 MG tablet Take 40 mg by mouth every evening.   Yes Historical Provider, MD  ranitidine (ZANTAC) 150 MG tablet Take 150 mg by mouth at bedtime.    Yes Historical Provider, MD  traMADol (ULTRAM) 50 MG tablet Take 1 tablet (50 mg total) by mouth every 6 (six) hours as needed. Patient taking differently: Take 50 mg by mouth every 6 (six) hours as needed for moderate pain.  05/14/16  Yes Annice Needy, MD  warfarin (COUMADIN) 4 MG tablet Take 2-4 mg by mouth daily at 8 pm. Patient take 4mg  on Tuesday and Thursday. 2mg  on all other days.   Yes Historical Provider, MD    REVIEW OF SYSTEMS:  Review of Systems  Constitutional: Negative for chills, fever, malaise/fatigue and weight loss.  HENT: Negative for ear pain, hearing loss and tinnitus.   Eyes: Negative for blurred vision, double vision, pain and redness.  Respiratory: Negative for cough, hemoptysis and shortness of breath.   Cardiovascular: Negative for chest pain, palpitations, orthopnea and leg swelling.  Gastrointestinal: Positive for abdominal pain. Negative for constipation, diarrhea, nausea and vomiting.  Genitourinary: Negative for dysuria, frequency and hematuria.  Musculoskeletal: Positive for back pain. Negative for joint pain and neck pain.  Skin:       No acne, rash, or lesions  Neurological: Negative for dizziness, tremors, focal weakness and weakness.  Endo/Heme/Allergies: Negative for polydipsia. Does not bruise/bleed easily.  Psychiatric/Behavioral:  Negative for depression. The patient is not nervous/anxious and does not have insomnia.      VITAL SIGNS:   Vitals:   08/09/16 2204 08/09/16 2230 08/09/16 2314 08/10/16 0003  BP: (!) 144/63 (!) 142/62 (!) 142/67 (!) 141/70  Pulse: 71 66 69 70  Resp: (!) 23 18 (!) 21 18  Temp:      TempSrc:      SpO2: 100% 100% 100% 100%  Weight:      Height:       Wt Readings from Last 3 Encounters:  08/09/16 72.6 kg (160 lb)  06/08/16 68.5 kg (151 lb)  05/22/16 68.5 kg (151 lb)    PHYSICAL EXAMINATION:  Physical Exam  Vitals reviewed. Constitutional: She is oriented to person, place, and time. She appears well-developed and well-nourished. No distress.  HENT:  Head: Normocephalic and atraumatic.  Mouth/Throat: Oropharynx is clear and moist.  Eyes: Conjunctivae and EOM are normal. Pupils are equal, round, and reactive to light. No scleral icterus.  Neck: Normal range of motion. Neck supple. No JVD present. No thyromegaly present.  Cardiovascular: Normal rate, regular rhythm and intact distal pulses.  Exam reveals no gallop and no friction rub.   No murmur heard. Respiratory: Effort normal and breath sounds normal. No respiratory distress. She has no wheezes. She has no rales.  GI: Soft. Bowel sounds are normal. She exhibits no distension. There is tenderness (low abdomen).  Musculoskeletal: Normal range of motion. She exhibits tenderness (low back). She exhibits no edema.  No arthritis, no gout  Lymphadenopathy:    She has no cervical adenopathy.  Neurological: She is alert and oriented to person, place, and time. No cranial nerve deficit.  No dysarthria, no aphasia  Skin: Skin is warm and dry. No rash noted. No erythema.  Psychiatric: She has a normal mood and affect. Her behavior is normal. Judgment and thought content normal.    LABORATORY PANEL:   CBC  Recent Labs Lab 08/09/16 2011  WBC 7.5  HGB 10.1*  HCT 29.0*  PLT 251    ------------------------------------------------------------------------------------------------------------------  Chemistries   Recent Labs Lab 08/09/16 2011  NA 132*  K 6.1*  CL 93*  CO2 24  GLUCOSE 127*  BUN 65*  CREATININE 5.46*  CALCIUM 8.7*  AST 18  ALT 12*  ALKPHOS 69  BILITOT 0.6   ------------------------------------------------------------------------------------------------------------------  Cardiac Enzymes No results for input(s): TROPONINI in the last 168 hours. ------------------------------------------------------------------------------------------------------------------  RADIOLOGY:  Ct Abdomen Pelvis Wo Contrast  Result Date: 08/09/2016 CLINICAL DATA:  Lower abdominal pain for 1 week, increasing today. Nausea. Allergy to intravenous contrast material. Oral contrast with barium. EXAM: CT ABDOMEN AND PELVIS WITHOUT CONTRAST TECHNIQUE: Multidetector CT imaging of the abdomen and pelvis was performed following the standard protocol without IV contrast. COMPARISON:  10/12/2015.  09/14/2015. FINDINGS: Lower chest: Minimal bilateral pleural effusions. Atelectasis and scarring in the lung bases. Calcified granulomas in both lung bases. Noncalcified nodule in the right lung base measures 9 mm diameter. No significant change since prior study. Calcification of the mitral valve annulus. Cardiac enlargement. Small esophageal hiatal hernia. Hepatobiliary: Suggestion of hepatic cirrhosis with nodular contour to the liver. No focal lesions are demonstrated on noncontrast imaging. Gallbladder is surgically absent. No bile duct dilatation. Pancreas: Fatty infiltration of the pancreas. No pancreatic ductal dilatation or infiltration. Spleen: Spleen size is normal.  No focal lesions. Adrenals/Urinary Tract: No adrenal gland nodules. Bilateral renal parenchymal atrophy with vascular calcifications along the renal arteries and arterials. Large calcification in the upper pole right  kidney measuring 12 mm. No change since prior study. No hydronephrosis or hydroureter. No ureteral stones identified. Evaluation of the bladder is severely limited due to streak artifact from bilateral hip arthroplasties. Stomach/Bowel: Stomach, small bowel, and colon are not abnormally distended. No discrete wall thickening is appreciated. Scattered stool in the colon. Appendix is normal. Vascular/Lymphatic: Extensive vascular calcifications throughout the abdomen and pelvis involving all major arterial structures, including the  abdominal aorta and branch vessels. No aortic aneurysm. IVC is normal in caliber. No significant lymphadenopathy. Reproductive: Uterus and bilateral adnexa are unremarkable. Other: Evaluation of pelvic organs is severely limited by streak artifact from bilateral hip arthroplasties. No free fluid or free air in the abdomen. Ventral abdominal wall hernia containing fat and measuring about 2.7 cm maximal diameter. This is new since prior study. No bowel herniation. Generator pack for stimulator device in the soft tissues posterior to the right pelvis. Musculoskeletal: There is increasing bone destruction and sclerosis of the endplates adjacent to the L2-3 interspace level. There is apparent bone destruction of the endplates. There is soft tissue swelling of the adjacent paraspinal muscles and psoas muscles, increased since prior study. This is suspicious for discitis/ osteomyelitis and surrounding inflammatory reaction. No definite loculated fluid collection identified. Diffuse degenerative change throughout the lumbar spine. IMPRESSION: 1. Described changes at L2-3 interspace suspicious for discitis/ osteomyelitis and inflammatory changes in the adjacent paraspinal and psoas muscles. 2. Small bilateral pleural effusions with basilar atelectasis. Small esophageal hiatal hernia. 9 mm nodule in the right lung base is unchanged since prior study. 3. Large nonobstructing stone in the superior pole  right kidney. 4. Extensive aortic atherosclerosis and calcification throughout the abdominal vasculature. 5. New ventral abdominal wall hernia containing fat. These results were called by telephone at the time of interpretation on 08/09/2016 at 11:21 pm to Dr. Daryel November , who verbally acknowledged these results. Electronically Signed   By: Burman Nieves M.D.   On: 08/09/2016 23:24    EKG:   Orders placed or performed during the hospital encounter of 08/09/16  . ED EKG  . ED EKG  . EKG 12-Lead  . EKG 12-Lead    IMPRESSION AND PLAN:  Principal Problem:   Discitis - possible concern for osteomyelitis based on CT imaging. IV antibiotics started, blood culture sent, wound culture ordered. Neurosurgery consult Active Problems:   Heel ulcer (HCC) - suspected source, patient has had long-standing wound care, but recently has had some expressible fluid from her wound. Wound culture ordered, IV antibiotics started as above taking into consideration her prior resistant organisms growing from wound culture.   End stage renal disease on dialysis [facility]) - consult nephrology for dialysis support   Diabetes mellitus (HCC) - sliding scale insulin with corresponding glucose checks   Chronic atrial fibrillation (HCC) - and tinea home meds including anticoagulation  All the records are reviewed and case discussed with ED provider. Management plans discussed with the patient and/or family.  DVT PROPHYLAXIS: Systemic anticoagulation  GI PROPHYLAXIS: H2 Blocker  ADMISSION STATUS: Inpatient  CODE STATUS: DNR - pt states she would not want to be resuscitated in the event of cardiac or cardiopulmonary arrest.  She would be willing to be intubated electively/temporarily treatable pulmonary causes. Code Status History    Date Active Date Inactive Code Status Order ID Comments User Context   04/27/2016 11:23 AM 04/27/2016  2:48 PM Full Code [id number]  Annice Needy, MD Inpatient   02/14/2016  8:31 PM  02/18/2016  5:05 PM DNR [id number]  Auburn Bilberry, MD ED   02/14/2016  8:31 PM 02/14/2016  8:31 PM Full Code [id number]  Auburn Bilberry, MD ED   10/12/2015  5:49 PM 10/15/2015  3:46 PM DNR [id number]  Altamese Dilling, MD Inpatient   09/14/2015 10:46 PM 09/18/2015  8:40 PM Full Code [id number]  Marguarite Arbour, MD Inpatient   08/13/2015  1:01 PM 08/16/2015  4:09 PM Full Code [id number]  Altamese DillingVaibhavkumar Vachhani, MD Inpatient   06/18/2015 11:41 PM 06/25/2015  1:04 AM DNR [id number]  Ramonita LabAruna Gouru, MD Inpatient   06/07/2015  3:46 PM 06/07/2015  6:48 PM Full Code [id number]  Gwyneth RevelsJustin Fowler, DPM Inpatient   12/11/2014 10:46 PM 12/13/2014  9:38 PM DNR [id number]  Enedina FinnerSona Patel, MD Inpatient   12/11/2014  9:10 PM 12/11/2014 10:46 PM DNR [id number]  Enedina FinnerSona Patel, MD ED      TOTAL TIME TAKING CARE OF THIS PATIENT: 45 minutes.    Jong Rickman FIELDING 08/10/2016, 12:23 AM  [facility]  Office  [phone]  CC: Primary care physician; Rolm GalaGRANDIS, HEIDI, MD

## 2016-08-10 NOTE — Progress Notes (Signed)
[employer] - [place] [facility]   PATIENT NAME: [name]    MR#:  [id number]  DATE OF BIRTH:  November 21, 1933  SUBJECTIVE:  CHIEF COMPLAINT:   Chief Complaint  Patient presents with  . Abdominal Pain    Lower groin and lower abdomen   - admitted with lower abdominal pain in both groin areas, denies any active back pain - no fevers or chills - for dialysis today  REVIEW OF SYSTEMS:  Review of Systems  Constitutional: Positive for malaise/fatigue. Negative for chills and fever.  HENT: Negative for congestion, ear discharge, hearing loss and nosebleeds.   Eyes: Negative for blurred vision.  Respiratory: Negative for cough, shortness of breath and wheezing.   Cardiovascular: Negative for chest pain, palpitations and leg swelling.  Gastrointestinal: Positive for abdominal pain. Negative for constipation, diarrhea, nausea and vomiting.  Genitourinary: Negative for dysuria.  Neurological: Negative for dizziness, seizures and headaches.    DRUG ALLERGIES:   Allergies  Allergen Reactions  . Codeine Itching and Other (See Comments)    Reaction:  Confusion/Hallucinations   . Contrast Media [Iodinated Diagnostic Agents] Other (See Comments)    "skin peel" breaks into a blistered rash, then peel. If given Benadryl and solumedrol she will be okay  . Oxycodone Other (See Comments)    Reaction:  Confusion/Hallucinations   . Quinine Derivatives Rash  . Tape Other (See Comments)    Skin is very thin and will blister and pull off skin with any tape except paper tape.  PLEASE USE PAPER TAPE.    VITALS:  Blood pressure (!) 132/49, pulse 95, temperature 98.3 F (36.8 C), temperature source Oral, resp. rate 20, height 5\' 5"  (1.651 m), weight 69.9 kg (154 lb 1.6 oz), SpO2 94 %.  PHYSICAL EXAMINATION:  Physical Exam  GENERAL:  83 y.o.-year-old patient lying in the bed with no acute distress.  EYES: Pupils equal, round, reactive to light and accommodation. No  scleral icterus. Extraocular muscles intact.  HEENT: Head atraumatic, normocephalic. Oropharynx and nasopharynx clear.  NECK:  Supple, no jugular venous distention. No thyroid enlargement, no tenderness.  LUNGS: Normal breath sounds bilaterally, no wheezing, rales,rhonchi or crepitation. No use of accessory muscles of respiration.  CARDIOVASCULAR: S1, S2 normal. No  rubs, or gallops. 3/6 systolic murmur present ABDOMEN: Soft, nontender, nondistended. Bowel sounds present. No organomegaly or mass.  EXTREMITIES: No pedal edema, cyanosis, or clubbing. Right leg dressing in place NEUROLOGIC: Cranial nerves II through XII are intact. Muscle strength 5/5 in all extremities. Sensation intact. Gait not checked.  PSYCHIATRIC: The patient is alert and oriented x 3.  SKIN: No obvious rash, lesion, or ulcer.    LABORATORY PANEL:   CBC  Recent Labs Lab 08/10/16 0327  WBC 8.0  HGB 10.1*  HCT 29.1*  PLT 252   ------------------------------------------------------------------------------------------------------------------  Chemistries   Recent Labs Lab 08/09/16 2011 08/10/16 0327  NA 132* 127*  K 6.1* 5.6*  CL 93* 91*  CO2 24 22  GLUCOSE 127* 110*  BUN 65* 65*  CREATININE 5.46* 5.37*  CALCIUM 8.7* 8.9  AST 18  --   ALT 12*  --   ALKPHOS 69  --   BILITOT 0.6  --    ------------------------------------------------------------------------------------------------------------------  Cardiac Enzymes No results for input(s): TROPONINI in the last 168 hours. ------------------------------------------------------------------------------------------------------------------  RADIOLOGY:  Ct Abdomen Pelvis Wo Contrast  Result Date: 08/09/2016 CLINICAL DATA:  Lower abdominal pain for 1 week, increasing today. Nausea. Allergy to intravenous contrast material. Oral contrast  with barium. EXAM: CT ABDOMEN AND PELVIS WITHOUT CONTRAST TECHNIQUE: Multidetector CT imaging of the abdomen and pelvis  was performed following the standard protocol without IV contrast. COMPARISON:  10/12/2015.  09/14/2015. FINDINGS: Lower chest: Minimal bilateral pleural effusions. Atelectasis and scarring in the lung bases. Calcified granulomas in both lung bases. Noncalcified nodule in the right lung base measures 9 mm diameter. No significant change since prior study. Calcification of the mitral valve annulus. Cardiac enlargement. Small esophageal hiatal hernia. Hepatobiliary: Suggestion of hepatic cirrhosis with nodular contour to the liver. No focal lesions are demonstrated on noncontrast imaging. Gallbladder is surgically absent. No bile duct dilatation. Pancreas: Fatty infiltration of the pancreas. No pancreatic ductal dilatation or infiltration. Spleen: Spleen size is normal.  No focal lesions. Adrenals/Urinary Tract: No adrenal gland nodules. Bilateral renal parenchymal atrophy with vascular calcifications along the renal arteries and arterials. Large calcification in the upper pole right kidney measuring 12 mm. No change since prior study. No hydronephrosis or hydroureter. No ureteral stones identified. Evaluation of the bladder is severely limited due to streak artifact from bilateral hip arthroplasties. Stomach/Bowel: Stomach, small bowel, and colon are not abnormally distended. No discrete wall thickening is appreciated. Scattered stool in the colon. Appendix is normal. Vascular/Lymphatic: Extensive vascular calcifications throughout the abdomen and pelvis involving all major arterial structures, including the abdominal aorta and branch vessels. No aortic aneurysm. IVC is normal in caliber. No significant lymphadenopathy. Reproductive: Uterus and bilateral adnexa are unremarkable. Other: Evaluation of pelvic organs is severely limited by streak artifact from bilateral hip arthroplasties. No free fluid or free air in the abdomen. Ventral abdominal wall hernia containing fat and measuring about 2.7 cm maximal diameter.  This is new since prior study. No bowel herniation. Generator pack for stimulator device in the soft tissues posterior to the right pelvis. Musculoskeletal: There is increasing bone destruction and sclerosis of the endplates adjacent to the L2-3 interspace level. There is apparent bone destruction of the endplates. There is soft tissue swelling of the adjacent paraspinal muscles and psoas muscles, increased since prior study. This is suspicious for discitis/ osteomyelitis and surrounding inflammatory reaction. No definite loculated fluid collection identified. Diffuse degenerative change throughout the lumbar spine. IMPRESSION: 1. Described changes at L2-3 interspace suspicious for discitis/ osteomyelitis and inflammatory changes in the adjacent paraspinal and psoas muscles. 2. Small bilateral pleural effusions with basilar atelectasis. Small esophageal hiatal hernia. 9 mm nodule in the right lung base is unchanged since prior study. 3. Large nonobstructing stone in the superior pole right kidney. 4. Extensive aortic atherosclerosis and calcification throughout the abdominal vasculature. 5. New ventral abdominal wall hernia containing fat. These results were called by telephone at the time of interpretation on 08/09/2016 at 11:21 pm to Dr. Daryel November , who verbally acknowledged these results. Electronically Signed   By: Burman Nieves M.D.   On: 08/09/2016 23:24    EKG:   Orders placed or performed during the hospital encounter of 08/09/16  . EKG 12-Lead  . EKG 12-Lead    ASSESSMENT AND PLAN:   83 y/o F with PMH of low back pain chronic, ESRD on M-W-F hemodialysis, afib on coumadin, HTN, neuropathy, PVD admitted with abdominal pain and noted to have L2-L3 discitis  #1 Abdominal pain- in the lower abdomen bilaterally - CT abd with right non obstructing renal stone-patient denies any flank pain, dysuria - no tenderness on exam, pain on moving- ? Radicular pain esp with hip flexion- psoas muscle  and paraspinal muscles inflammation noted -  No back pain now. No fevers or elevated wbc -neuro surgery on call consulted- since no coverage today- recommended MRI without contrast (dialysis patient) or if any concern for epidural abscess - that patient need to be transferred - MRI ordered, patient is clinically stable, with the inclement weather- will transfer if needed - awaiting ID input - continue zyvox and zosyn for now  #2 Right heel ulcer- well healed, dressing changes - wound care consult  #3 ESRD on HD- for dialysis today, nephrology consulted Hyponatremia and hyperkalemia on labs noted.  #4 Afib- rate controlled, on oral cardizem, also on coumadin  #5 Neuropathy- on gabapentin  #6 DVT Prophylaxis- on coumadin  Physical Therapy consult once more stable    All the records are reviewed and case discussed with Care Management/Social Workerr. Management plans discussed with the patient, family and they are in agreement.  CODE STATUS: DNR  TOTAL TIME TAKING CARE OF THIS PATIENT: 39 minutes.   POSSIBLE D/C IN 2 DAYS, DEPENDING ON CLINICAL CONDITION.   Enid Baas M.D on 08/10/2016 at 3:37 PM  Between 7am to 6pm - Pager - [phone]  After 6pm go to [email or web address] - Social research officer, government  [facility]  Office  [phone]  CC: Primary care physician; [name], MD

## 2016-08-10 NOTE — Progress Notes (Signed)
Pre hd assessment  

## 2016-08-10 NOTE — Progress Notes (Signed)
Pre hd info 

## 2016-08-10 NOTE — Progress Notes (Addendum)
Notified Dr. Nemiah CommanderKalisetti that pt can not have MRI at [facility] due to a stimulator in the back. Would have to go to [facility] for MRI. Per MD will continue to monitor and will not transfer at this time.

## 2016-08-10 NOTE — Progress Notes (Signed)
Wound culture not collected this shift. Orvil FeilAbbie Goodman, day shift RN, stated that the wound was scabbed over and there is no where to swab. Abbie stated Dr. Nemiah CommanderKalisetti said to hold off completing the culture. Will continue to monitor.

## 2016-08-10 NOTE — Progress Notes (Signed)
Spoke with Dr. Nemiah CommanderKalisetti about the fact that night shift stated that wound was dried up and were unsure how to collect culture. Per MD this is okay and wound care will follow-up with wound on leg.

## 2016-08-10 NOTE — Progress Notes (Signed)
Pharmacy Antibiotic Note  Natalie RimaJoann Y [name] is a 83 y.o. female admitted on 08/09/2016 with discitis.  Pharmacy has been consulted for Zyvox and Zosyn dosing.  Plan: Zyvox 600 mg I q 12 hours ordered. Zosyn 3.375 grams q 12 hours ordered.  Height: 5\' 5"  (165.1 cm) Weight: 154 lb 3.2 oz (69.9 kg) IBW/kg (Calculated) : 57  Temp (24hrs), Avg:98.2 F (36.8 C), Min:97.8 F (36.6 C), Max:98.5 F (36.9 C)   Recent Labs Lab 08/09/16 2011  WBC 7.5  CREATININE 5.46*    Estimated Creatinine Clearance: 8.1 mL/min (by C-G formula based on SCr of 5.46 mg/dL (H)).    Allergies  Allergen Reactions  . Codeine Itching and Other (See Comments)    Reaction:  Confusion/Hallucinations   . Contrast Media [Iodinated Diagnostic Agents] Other (See Comments)    "skin peel" breaks into a blistered rash, then peel. If given Benadryl and solumedrol she will be okay  . Oxycodone Other (See Comments)    Reaction:  Confusion/Hallucinations   . Quinine Derivatives Rash  . Tape Other (See Comments)    Skin is very thin and will blister and pull off skin with any tape except paper tape.  PLEASE USE PAPER TAPE.    Antimicrobials this admission: Zyvox Zosyn 3/12 >>    >>   Dose adjustments this admission:   Microbiology results: 3/11 BCx: pending 3/11 UCx: pending  3/12 MRSA PCR: pending     3/11 UA: LE(+)  NO2(-)  WBC TNTC Thank you for allowing pharmacy to be a part of this patient's care.  Ramon Brant S 08/10/2016 2:59 AM

## 2016-08-10 NOTE — Progress Notes (Signed)
ANTICOAGULATION CONSULT NOTE - Initial Consult  Pharmacy Consult for warfarin dosing Indication: atrial fibrillation  Allergies  Allergen Reactions  . Codeine Itching and Other (See Comments)    Reaction:  Confusion/Hallucinations   . Contrast Media [Iodinated Diagnostic Agents] Other (See Comments)    "skin peel" breaks into a blistered rash, then peel. If given Benadryl and solumedrol she will be okay  . Oxycodone Other (See Comments)    Reaction:  Confusion/Hallucinations   . Quinine Derivatives Rash  . Tape Other (See Comments)    Skin is very thin and will blister and pull off skin with any tape except paper tape.  PLEASE USE PAPER TAPE.    Patient Measurements: Height: 5\' 5"  (165.1 cm) Weight: 154 lb 3.2 oz (69.9 kg) IBW/kg (Calculated) : 57 Heparin Dosing Weight: n/a  Vital Signs: Temp: 97.7 F (36.5 C) (03/12 0423) Temp Source: Oral (03/12 0423) BP: 140/59 (03/12 0423) Pulse Rate: 71 (03/12 0423)  Labs:  Recent Labs  08/09/16 2011 08/10/16 0327  HGB 10.1* 10.1*  HCT 29.0* 29.1*  PLT 251 252  LABPROT  --  33.0*  INR  --  3.14  CREATININE 5.46* 5.37*    Estimated Creatinine Clearance: 8.2 mL/min (by C-G formula based on SCr of 5.37 mg/dL (H)).   Medical History: Past Medical History:  Diagnosis Date  . Anemia   . Anxiety   . Arthritis    gout  . Atrial fibrillation (HCC)   . CHF (congestive heart failure) (HCC)    has had this off and on prior to dialysis but has had several times since  . Chronic kidney disease    end stage renal insufficiency.  on dialysis M,W,F  . Diabetes mellitus without complication (HCC)   . Dialysis patient [facility](HCC)    Mon.- Wed- Fri.  . Dysrhythmia    afib/flutter  . GERD (gastroesophageal reflux disease)   . Hypertension   . 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 Peripheral vascular disease (HCC)   . Pleural effusion   . Pulmonary hypertension   . Renal insufficiency   . Restless leg syndrome   .  Shortness of breath dyspnea    with exertion  . Stroke (HCC) 06/2015   has had 1 other stroke. first one affected vision. the most recent one affected right arm    Medications:  Home regimen 4 mg Tu, Th; 2 mg all other days.  Assessment: INR supratherapeutic on admission  Goal of Therapy:  INR 2-3    Plan:  Hold warfarin today. INR daily while on abx.  Jkayla Spiewak S 08/10/2016,5:40 AM

## 2016-08-10 NOTE — Progress Notes (Signed)
Hemodialysis started

## 2016-08-11 LAB — HEMOGLOBIN A1C
Hgb A1c MFr Bld: 5.1 % (ref 4.8–5.6)
MEAN PLASMA GLUCOSE: 100 mg/dL

## 2016-08-11 LAB — BASIC METABOLIC PANEL
ANION GAP: 10 (ref 5–15)
BUN: 28 mg/dL — ABNORMAL HIGH (ref 6–20)
CALCIUM: 8.5 mg/dL — AB (ref 8.9–10.3)
CHLORIDE: 94 mmol/L — AB (ref 101–111)
CO2: 30 mmol/L (ref 22–32)
CREATININE: 3.2 mg/dL — AB (ref 0.44–1.00)
GFR calc Af Amer: 15 mL/min — ABNORMAL LOW (ref >60)
GFR calc non Af Amer: 13 mL/min — ABNORMAL LOW (ref >60)
GLUCOSE: 111 mg/dL — AB (ref 65–99)
Potassium: 4 mmol/L (ref 3.5–5.1)
Sodium: 134 mmol/L — ABNORMAL LOW (ref 135–145)

## 2016-08-11 LAB — GLUCOSE, CAPILLARY
GLUCOSE-CAPILLARY: 106 mg/dL — AB (ref 65–99)
GLUCOSE-CAPILLARY: 140 mg/dL — AB (ref 65–99)
GLUCOSE-CAPILLARY: 150 mg/dL — AB (ref 65–99)
Glucose-Capillary: 126 mg/dL — ABNORMAL HIGH (ref 65–99)

## 2016-08-11 LAB — SEDIMENTATION RATE: Sed Rate: 86 mm/hr — ABNORMAL HIGH (ref 0–30)

## 2016-08-11 LAB — PARATHYROID HORMONE, INTACT (NO CA): PTH: 83 pg/mL — AB (ref 15–65)

## 2016-08-11 LAB — PROTIME-INR
INR: 2.42
PROTHROMBIN TIME: 26.8 s — AB (ref 11.4–15.2)

## 2016-08-11 LAB — C-REACTIVE PROTEIN: CRP: 7.8 mg/dL — AB (ref <1.0)

## 2016-08-11 LAB — URINE CULTURE: Culture: >=100000 — AB

## 2016-08-11 MED ORDER — METHOCARBAMOL 500 MG PO TABS
500.0000 mg | ORAL_TABLET | Freq: Two times a day (BID) | ORAL | Status: AC
  Administered 2016-08-11 – 2016-08-12 (×4): 500 mg via ORAL
  Filled 2016-08-11 (×4): qty 1

## 2016-08-11 MED ORDER — WARFARIN SODIUM 4 MG PO TABS
4.0000 mg | ORAL_TABLET | Freq: Once | ORAL | Status: DC
  Filled 2016-08-11: qty 1

## 2016-08-11 MED ORDER — METAXALONE 400 MG HALF TABLET: 400.0000 mg | ORAL_TABLET | Freq: Two times a day (BID) | ORAL | Status: DC

## 2016-08-11 NOTE — Progress Notes (Signed)
[facility]  ROUNDING NOTE   Subjective:  Patient had hemodialysis yesterday. Serum potassium improved down to 4.0 at the moment. Serum sodium also improved to 134.   Objective:  Vital signs in last 24 hours:  Temp:  [97.8 F (36.6 C)-98.3 F (36.8 C)] 97.8 F (36.6 C) (03/13 1322) Pulse Rate:  [58-95] 58 (03/13 1322) Resp:  [16-20] 16 (03/13 1322) BP: (99-132)/(47-54) 99/54 (03/13 1322) SpO2:  [93 %-95 %] 93 % (03/13 1322) Weight:  [69.4 kg (152 lb 14.4 oz)] 69.4 kg (152 lb 14.4 oz) (03/13 0529)  Weight change: -2.676 kg (-5 lb 14.4 oz) Filed Weights   08/10/16 0146 08/10/16 1027 08/11/16 0529  Weight: 69.9 kg (154 lb 3.2 oz) 69.9 kg (154 lb 1.6 oz) 69.4 kg (152 lb 14.4 oz)    Intake/Output: I/O last 3 completed shifts: In: 538 [P.O.:220; IV Piggyback:318] Out: 2001 [Other:2000; Stool:1]   Intake/Output this shift:  No intake/output data recorded.  Physical Exam: General: No acute distress  Head: Normocephalic, atraumatic. Moist oral mucosal membranes  Eyes: Anicteric  Neck: Supple, trachea midline  Lungs:  Clear to auscultation, normal effort  Heart: S1S2 no rubs  Abdomen:  Soft, nontender, bowel sounds present  Extremities: 1+ peripheral edema, RLE ulcers covered  Neurologic: Nonfocal, moving all four extremities  Skin: No lesions  Access: RUE AVF    Basic Metabolic Panel:  Recent Labs Lab 08/09/16 2011 08/10/16 0327 08/10/16 1040 08/11/16 0401  NA 132* 127*  --  134*  K 6.1* 5.6*  --  4.0  CL 93* 91*  --  94*  CO2 24 22  --  30  GLUCOSE 127* 110*  --  111*  BUN 65* 65*  --  28*  CREATININE 5.46* 5.37*  --  3.20*  CALCIUM 8.7* 8.9  --  8.5*  PHOS  --   --  5.8*  --     Liver Function Tests:  Recent Labs Lab 08/09/16 2011  AST 18  ALT 12*  ALKPHOS 69  BILITOT 0.6  PROT 7.4  ALBUMIN 3.2*   No results for input(s): LIPASE, AMYLASE in the last 168 hours. No results for input(s): AMMONIA in the last 168  hours.  CBC:  Recent Labs Lab 08/09/16 2011 08/10/16 0327  WBC 7.5 8.0  NEUTROABS 5.7  --   HGB 10.1* 10.1*  HCT 29.0* 29.1*  MCV 96.7 97.2  PLT 251 252    Cardiac Enzymes: No results for input(s): CKTOTAL, CKMB, CKMBINDEX, TROPONINI in the last 168 hours.  BNP: Invalid input(s): POCBNP  CBG:  Recent Labs Lab 08/10/16 1510 08/10/16 1635 08/10/16 2106 08/11/16 0736 08/11/16 1126  GLUCAP 88 95 146* 106* 140*    Microbiology: Results for orders placed or performed during the hospital encounter of 08/09/16  Urine culture     Status: Abnormal   Collection Time: 08/09/16 10:04 PM  Result Value Ref Range Status   Specimen Description URINE, RANDOM  Final   Special Requests NONE  Final   Culture (A)  Final    >=100,000 COLONIES/mL GROUP B STREP(S.AGALACTIAE)ISOLATED TESTING AGAINST S. AGALACTIAE NOT ROUTINELY PERFORMED DUE TO PREDICTABILITY OF AMP/PEN/VAN SUSCEPTIBILITY. Performed at [facility], [address], [place], [place] [address]    Report Status 08/11/2016 FINAL  Final  Blood culture (routine x 2)     Status: None (Preliminary result)   Collection Time: 08/09/16 11:54 PM  Result Value Ref Range Status   Specimen Description BLOOD LEFT UPPER ARM  Final   Special Requests   Final    BOTTLES DRAWN AEROBIC AND ANAEROBIC BCHV HIGH VOLUME   Culture NO GROWTH 1 DAY  Final   Report Status PENDING  Incomplete  Blood culture (routine x 2)     Status: None (Preliminary result)   Collection Time: 08/09/16 11:59 PM  Result Value Ref Range Status   Specimen Description BLOOD LEFT FOREARM  Final   Special Requests   Final    BOTTLES DRAWN AEROBIC AND ANAEROBIC BCAV ADEQUATE VOLUME   Culture NO GROWTH 1 DAY  Final   Report Status PENDING  Incomplete  MRSA PCR Screening     Status: None   Collection Time: 08/10/16  2:29 AM  Result Value Ref Range Status   MRSA by PCR NEGATIVE NEGATIVE Final    Comment:        The GeneXpert MRSA Assay (FDA approved for  NASAL specimens only), is one component of a comprehensive MRSA colonization surveillance program. It is not intended to diagnose MRSA infection nor to guide or monitor treatment for MRSA infections.     Coagulation Studies:  Recent Labs  08/10/16 0327 08/11/16 0401  LABPROT 33.0* 26.8*  INR 3.14 2.42    Urinalysis:  Recent Labs  08/09/16 2204  COLORURINE AMBER*  LABSPEC 1.014  PHURINE 6.0  GLUCOSEU NEGATIVE  HGBUR MODERATE*  BILIRUBINUR NEGATIVE  KETONESUR NEGATIVE  PROTEINUR 100*  NITRITE NEGATIVE  LEUKOCYTESUR LARGE*      Imaging: Ct Abdomen Pelvis Wo Contrast  Result Date: 08/09/2016 CLINICAL DATA:  Lower abdominal pain for 1 week, increasing today. Nausea. Allergy to intravenous contrast material. Oral contrast with barium. EXAM: CT ABDOMEN AND PELVIS WITHOUT CONTRAST TECHNIQUE: Multidetector CT imaging of the abdomen and pelvis was performed following the standard protocol without IV contrast. COMPARISON:  10/12/2015.  09/14/2015. FINDINGS: Lower chest: Minimal bilateral pleural effusions. Atelectasis and scarring in the lung bases. Calcified granulomas in both lung bases. Noncalcified nodule in the right lung base measures 9 mm diameter. No significant change since prior study. Calcification of the mitral valve annulus. Cardiac enlargement. Small esophageal hiatal hernia. Hepatobiliary: Suggestion of hepatic cirrhosis with nodular contour to the liver. No focal lesions are demonstrated on noncontrast imaging. Gallbladder is surgically absent. No bile duct dilatation. Pancreas: Fatty infiltration of the pancreas. No pancreatic ductal dilatation or infiltration. Spleen: Spleen size is normal.  No focal lesions. Adrenals/Urinary Tract: No adrenal gland nodules. Bilateral renal parenchymal atrophy with vascular calcifications along the renal arteries and arterials. Large calcification in the upper pole right kidney measuring 12 mm. No change since prior study. No  hydronephrosis or hydroureter. No ureteral stones identified. Evaluation of the bladder is severely limited due to streak artifact from bilateral hip arthroplasties. Stomach/Bowel: Stomach, small bowel, and colon are not abnormally distended. No discrete wall thickening is appreciated. Scattered stool in the colon. Appendix is normal. Vascular/Lymphatic: Extensive vascular calcifications throughout the abdomen and pelvis involving all major arterial structures, including the abdominal aorta and branch vessels. No aortic aneurysm. IVC is normal in caliber. No significant lymphadenopathy. Reproductive: Uterus and bilateral adnexa are unremarkable. Other: Evaluation of pelvic organs is severely limited by streak artifact from bilateral hip arthroplasties. No free fluid or free air in the abdomen. Ventral abdominal wall hernia containing fat and measuring about 2.7 cm maximal diameter. This is new since prior study. No bowel herniation. Generator pack for stimulator device in the soft tissues posterior to the right pelvis. Musculoskeletal: There is increasing bone destruction and  sclerosis of the endplates adjacent to the L2-3 interspace level. There is apparent bone destruction of the endplates. There is soft tissue swelling of the adjacent paraspinal muscles and psoas muscles, increased since prior study. This is suspicious for discitis/ osteomyelitis and surrounding inflammatory reaction. No definite loculated fluid collection identified. Diffuse degenerative change throughout the lumbar spine. IMPRESSION: 1. Described changes at L2-3 interspace suspicious for discitis/ osteomyelitis and inflammatory changes in the adjacent paraspinal and psoas muscles. 2. Small bilateral pleural effusions with basilar atelectasis. Small esophageal hiatal hernia. 9 mm nodule in the right lung base is unchanged since prior study. 3. Large nonobstructing stone in the superior pole right kidney. 4. Extensive aortic atherosclerosis and  calcification throughout the abdominal vasculature. 5. New ventral abdominal wall hernia containing fat. These results were called by telephone at the time of interpretation on 08/09/2016 at 11:21 pm to Dr. Daryel NovemberJONATHAN WILLIAMS , who verbally acknowledged these results. Electronically Signed   By: Burman NievesWilliam  Stevens M.D.   On: 08/09/2016 23:24     Medications:    . calcium acetate  1,334 mg Oral TID WC  . diltiazem  120 mg Oral BH-q7a  . [START ON 08/12/2016] epoetin (EPOGEN/PROCRIT) injection  4,000 Units Intravenous Q M,W,F-HD  . famotidine  20 mg Oral Daily  . gabapentin  100 mg Oral Q T,Th,S,Su  . gabapentin  300 mg Oral Q M,W,F-2000  . insulin aspart  0-5 Units Subcutaneous QHS  . insulin aspart  0-9 Units Subcutaneous TID WC  . methocarbamol  500 mg Oral BID  . pravastatin  40 mg Oral QPM   acetaminophen **OR** acetaminophen, albuterol, ondansetron **OR** ondansetron (ZOFRAN) IV, traMADol  Assessment/ Plan:  83 y.o. female with atrial fibrillation on Coumadin, hypertension, gout, insulin-dependent diabetes mellitus type 2, diabetic neuropathy, CVA with right visual field blindness, hyperlipidemia, major depressive disorder, overactive bladder with incontinence, generalized anxiety disorder, congestive heart failure diastolic, parathyroidectomy, and osteoarthritis  MWF [facility] [address]   1. End Stage Renal Disease:  - patient completed hemodialysis yesterday.  No acute indication for dialysis today.  We will plan for dialysis again tomorrow.  2. Discitis. Noted on imaging upon admission.  Currently on linezolid and zosyn.   3. Hypertension:   blood pressure currently 99/54.  Patient maintained on diltiazem.  4. Secondary Hyperparathyroidism: phosphorus currently 5.8.  Continue patient on calcium acetate.  5. Anemia of chronic kidney disease:  Continue Epogen 4000 units IV with dialysis.  LOS: 1 Cherly Erno 3/13/20182:37 PM

## 2016-08-11 NOTE — Progress Notes (Signed)
[place] INFECTIOUS DISEASE PROGRESS NOTE Date of Admission:  08/09/2016     ID: [name] is a 83 y.o. female with  L spine discitis Principal Problem:   Discitis Active Problems:   End stage renal disease on dialysis ([place])   Diabetes mellitus ([place])   Chronic atrial fibrillation (HCC)   Heel ulcer (HCC)   Subjective: Still with pain, no Fevers  ROS  Eleven systems are reviewed and negative except per hpi  Medications:  Antibiotics Given (last 72 hours)    Date/Time Action Medication Dose Rate   08/09/16 2310 Given   cefTRIAXone (ROCEPHIN) IVPB 1 g 1 g 100 mL/hr   08/10/16 0356 Given   linezolid (ZYVOX) IVPB 600 mg 600 mg 300 mL/hr   08/10/16 1525 Given   piperacillin-tazobactam (ZOSYN) IVPB 3.375 g 3.375 g 12.5 mL/hr   08/10/16 1655 Given   linezolid (ZYVOX) IVPB 600 mg 600 mg 300 mL/hr     . calcium acetate  1,334 mg Oral TID WC  . diltiazem  120 mg Oral BH-q7a  . [START ON 08/12/2016] epoetin (EPOGEN/PROCRIT) injection  4,000 Units Intravenous Q M,W,F-HD  . famotidine  20 mg Oral Daily  . gabapentin  100 mg Oral Q T,Th,S,Su  . gabapentin  300 mg Oral Q M,W,F-2000  . insulin aspart  0-5 Units Subcutaneous QHS  . insulin aspart  0-9 Units Subcutaneous TID WC  . methocarbamol  500 mg Oral BID  . pravastatin  40 mg Oral QPM    Objective: Vital signs in last 24 hours: Temp:  [97.8 F (36.6 C)-98.3 F (36.8 C)] 97.8 F (36.6 C) (03/13 1322) Pulse Rate:  [58-81] 58 (03/13 1322) Resp:  [16] 16 (03/13 1322) BP: (99-127)/(47-54) 99/54 (03/13 1322) SpO2:  [93 %-95 %] 93 % (03/13 1322) Weight:  [69.4 kg (152 lb 14.4 oz)] 69.4 kg (152 lb 14.4 oz) (03/13 0529) Constitutional:  oriented to person, place, and time. appears well-developed and well-nourished. No distress.  HENT: [place]/AT, PERRLA, no scleral icterus Mouth/Throat: Oropharynx is clear and dry . No oropharyngeal exudate.  Cardiovascular: Normal rate, regular rhythm and normal heart  sounds. Pulmonary/Chest: Effort normal and breath sounds normal. No respiratory distress.  has no wheezes.  Neck = supple, no nuchal rigidity Abdominal: Soft. Bowel sounds are normal.  exhibits no distension. There is no tenderness.  Lymphadenopathy: no cervical adenopathy. No axillary adenopathy Neurological: alert and oriented to person, place, and time.  Ext R UE avf wnl Skin: Skin is warm and dry. R heel with eschar over achiles  Psychiatric: a normal mood and affect.  behavior is normal.   Lab Results  Recent Labs  08/09/16 2011 08/10/16 0327 08/11/16 0401  WBC 7.5 8.0  --   HGB 10.1* 10.1*  --   HCT 29.0* 29.1*  --   NA 132* 127* 134*  K 6.1* 5.6* 4.0  CL 93* 91* 94*  CO2 _0 BUN 65* 65* 28*  CREATININE 5.46* 5.37* 3.20*    Microbiology: Results for orders placed or performed during the hospital encounter of 08/09/16  Urine culture     Status: Abnormal   Collection Time: 08/09/16 10:04 PM  Result Value Ref Range Status   Specimen Description URINE, RANDOM  Final   Special Requests NONE  Final   Culture (A)  Final    >=100,000 COLONIES/mL GROUP B STREP(S.AGALACTIAE)ISOLATED TESTING AGAINST S. AGALACTIAE NOT ROUTINELY PERFORMED DUE TO PREDICTABILITY OF AMP/PEN/VAN SUSCEPTIBILITY. Performed at [facility], [address] [place], [place] [address]    Report Status 08/11/2016 FINAL  Final  Blood culture (routine x 2)     Status: None (Preliminary result)   Collection Time: 08/09/16 11:54 PM  Result Value Ref Range Status   Specimen Description BLOOD LEFT UPPER ARM  Final   Special Requests   Final    BOTTLES DRAWN AEROBIC AND ANAEROBIC BCHV HIGH VOLUME   Culture NO GROWTH 1 DAY  Final   Report Status PENDING  Incomplete  Blood culture (routine x 2)     Status: None (Preliminary result)   Collection Time: 08/09/16 11:59 PM  Result Value Ref Range Status   Specimen Description BLOOD LEFT FOREARM  Final   Special Requests   Final    BOTTLES DRAWN  AEROBIC AND ANAEROBIC BCAV ADEQUATE VOLUME   Culture NO GROWTH 1 DAY  Final   Report Status PENDING  Incomplete  MRSA PCR Screening     Status: None   Collection Time: 08/10/16  2:29 AM  Result Value Ref Range Status   MRSA by PCR NEGATIVE NEGATIVE Final    Comment:        The GeneXpert MRSA Assay (FDA approved for NASAL specimens only), is one component of a comprehensive MRSA colonization surveillance program. It is not intended to diagnose MRSA infection nor to guide or monitor treatment for MRSA infections.     Studies/Results: Ct Abdomen Pelvis Wo Contrast  Result Date: 08/09/2016 CLINICAL DATA:  Lower abdominal pain for 1 week, increasing today. Nausea. Allergy to intravenous contrast material. Oral contrast with barium. EXAM: CT ABDOMEN AND PELVIS WITHOUT CONTRAST TECHNIQUE: Multidetector CT imaging of the abdomen and pelvis was performed following the standard protocol without IV contrast. COMPARISON:  10/12/2015.  09/14/2015. FINDINGS: Lower chest: Minimal bilateral pleural effusions. Atelectasis and scarring in the lung bases. Calcified granulomas in both lung bases. Noncalcified nodule in the right lung base measures 9 mm diameter. No significant change since prior study. Calcification of the mitral valve annulus. Cardiac enlargement. Small esophageal hiatal hernia. Hepatobiliary: Suggestion of hepatic cirrhosis with nodular contour to the liver. No focal lesions are demonstrated on noncontrast imaging. Gallbladder is surgically absent. No bile duct dilatation. Pancreas: Fatty infiltration of the pancreas. No pancreatic ductal dilatation or infiltration. Spleen: Spleen size is normal.  No focal lesions. Adrenals/Urinary Tract: No adrenal gland nodules. Bilateral renal parenchymal atrophy with vascular calcifications along the renal arteries and arterials. Large calcification in the upper pole right kidney measuring 12 mm. No change since prior study. No hydronephrosis or  hydroureter. No ureteral stones identified. Evaluation of the bladder is severely limited due to streak artifact from bilateral hip arthroplasties. Stomach/Bowel: Stomach, small bowel, and colon are not abnormally distended. No discrete wall thickening is appreciated. Scattered stool in the colon. Appendix is normal. Vascular/Lymphatic: Extensive vascular calcifications throughout the abdomen and pelvis involving all major arterial structures, including the abdominal aorta and branch vessels. No aortic aneurysm. IVC is normal in caliber. No significant lymphadenopathy. Reproductive: Uterus and bilateral adnexa are unremarkable. Other: Evaluation of pelvic organs is severely limited by streak artifact from bilateral hip arthroplasties. No free fluid or free air in the abdomen. Ventral abdominal wall hernia containing fat and measuring about 2.7 cm maximal diameter. This is new since prior study. No bowel herniation. Generator pack for stimulator device in the soft tissues posterior to the right pelvis. Musculoskeletal: There is increasing bone destruction and sclerosis of the endplates adjacent to the L2-3 interspace level. There is  apparent bone destruction of the endplates. There is soft tissue swelling of the adjacent paraspinal muscles and psoas muscles, increased since prior study. This is suspicious for discitis/ osteomyelitis and surrounding inflammatory reaction. No definite loculated fluid collection identified. Diffuse degenerative change throughout the lumbar spine. IMPRESSION: 1. Described changes at L2-3 interspace suspicious for discitis/ osteomyelitis and inflammatory changes in the adjacent paraspinal and psoas muscles. 2. Small bilateral pleural effusions with basilar atelectasis. Small esophageal hiatal hernia. 9 mm nodule in the right lung base is unchanged since prior study. 3. Large nonobstructing stone in the superior pole right kidney. 4. Extensive aortic atherosclerosis and calcification  throughout the abdominal vasculature. 5. New ventral abdominal wall hernia containing fat. These results were called by telephone at the time of interpretation on 08/09/2016 at 11:21 pm to Dr. Lenise Arena , who verbally acknowledged these results. Electronically Signed   By: Lucienne Capers M.D.   On: 08/09/2016 23:24    Assessment/Plan: [name] is a 83 y.o. female admitted with back pain and found on CT imaging to have possible L2-3 discitis and surrounding phlegmon. SHe is ESRD and is on HD but has not had any issues with HD access infection now recent bacteremias. She does have a chronic heel ulcer but this appears relatively dry and not actively infection. She had prior CT abd 2017 with some sclerotic changes noted.  Apparently MRI cannot be done locally due to a spinal device she has in place.  I think that she will need further work up with MRI and possibly aspiration of infected area for culture and pathology.  ESR 86.  CRP 7.8  Recommendations  Will review imaging with IR and discuss whether MRI would be helpful or if aspiration is possible I have stopped her abx for now since we have no cultures to show [place] what to direct therapy to Hold coumadin in anticipation of procedure.   Thank you very much for the consult. Will follow with you.  Chiron Campione P   08/11/2016, 4:23 PM

## 2016-08-11 NOTE — Care Management (Signed)
[name] HD liaison notified of admission.  

## 2016-08-11 NOTE — Progress Notes (Signed)
[facility] [place] [facility]   PATIENT NAME: [name]    MR#:  [id number]  DATE OF BIRTH:  Jun 27, 1933  SUBJECTIVE:  CHIEF COMPLAINT:   Chief Complaint  Patient presents with  . Abdominal Pain    Lower groin and lower abdomen   - still complains of bilateral groin pain radiating along right leg as well - no back pain, MRI cannot be done due to back stimulator - no fevers, ABX discontinued by ID  REVIEW OF SYSTEMS:  Review of Systems  Constitutional: Positive for malaise/fatigue. Negative for chills and fever.  HENT: Negative for congestion, ear discharge, hearing loss and nosebleeds.   Eyes: Negative for blurred vision.  Respiratory: Negative for cough, shortness of breath and wheezing.   Cardiovascular: Negative for chest pain, palpitations and leg swelling.  Gastrointestinal: Positive for abdominal pain. Negative for constipation, diarrhea, nausea and vomiting.  Genitourinary: Negative for dysuria.  Musculoskeletal: Positive for back pain.  Neurological: Negative for dizziness, seizures and headaches.    DRUG ALLERGIES:   Allergies  Allergen Reactions  . Codeine Itching and Other (See Comments)    Reaction:  Confusion/Hallucinations   . Contrast Media [Iodinated Diagnostic Agents] Other (See Comments)    "skin peel" breaks into a blistered rash, then peel. If given Benadryl and solumedrol she will be okay  . Oxycodone Other (See Comments)    Reaction:  Confusion/Hallucinations   . Quinine Derivatives Rash  . Tape Other (See Comments)    Skin is very thin and will blister and pull off skin with any tape except paper tape.  PLEASE USE PAPER TAPE.    VITALS:  Blood pressure (!) 113/47, pulse 78, temperature 98.3 F (36.8 C), temperature source Oral, resp. rate 16, height 5\' 5"  (1.651 m), weight 69.4 kg (152 lb 14.4 oz), SpO2 93 %.  PHYSICAL EXAMINATION:  Physical Exam  GENERAL:  83 y.o.-year-old patient lying in the bed with no acute  distress.  EYES: Pupils equal, round, reactive to light and accommodation. No scleral icterus. Extraocular muscles intact.  HEENT: Head atraumatic, normocephalic. Oropharynx and nasopharynx clear.  NECK:  Supple, no jugular venous distention. No thyroid enlargement, no tenderness.  LUNGS: Normal breath sounds bilaterally, no wheezing, rales,rhonchi or crepitation. No use of accessory muscles of respiration.  CARDIOVASCULAR: S1, S2 normal. No  rubs, or gallops. 3/6 systolic murmur present ABDOMEN: Soft, nontender, nondistended. Bowel sounds present. No organomegaly or mass.  EXTREMITIES: No pedal edema, cyanosis, or clubbing. Right leg dressing in place NEUROLOGIC: Cranial nerves II through XII are intact. Muscle strength 5/5 in all extremities. Sensation intact. Gait not checked.  PSYCHIATRIC: The patient is alert and oriented x 3.  SKIN: No obvious rash, lesion, or ulcer.    LABORATORY PANEL:   CBC  Recent Labs Lab 08/10/16 0327  WBC 8.0  HGB 10.1*  HCT 29.1*  PLT 252   ------------------------------------------------------------------------------------------------------------------  Chemistries   Recent Labs Lab 08/09/16 2011  08/11/16 0401  NA 132*  < > 134*  K 6.1*  < > 4.0  CL 93*  < > 94*  CO2 24  < > 30  GLUCOSE 127*  < > 111*  BUN 65*  < > 28*  CREATININE 5.46*  < > 3.20*  CALCIUM 8.7*  < > 8.5*  AST 18  --   --   ALT 12*  --   --   ALKPHOS 69  --   --   BILITOT 0.6  --   --   < > =  values in this interval not displayed. ------------------------------------------------------------------------------------------------------------------  Cardiac Enzymes No results for input(s): TROPONINI in the last 168 hours. ------------------------------------------------------------------------------------------------------------------  RADIOLOGY:  Ct Abdomen Pelvis Wo Contrast  Result Date: 08/09/2016 CLINICAL DATA:  Lower abdominal pain for 1 week, increasing today.  Nausea. Allergy to intravenous contrast material. Oral contrast with barium. EXAM: CT ABDOMEN AND PELVIS WITHOUT CONTRAST TECHNIQUE: Multidetector CT imaging of the abdomen and pelvis was performed following the standard protocol without IV contrast. COMPARISON:  10/12/2015.  09/14/2015. FINDINGS: Lower chest: Minimal bilateral pleural effusions. Atelectasis and scarring in the lung bases. Calcified granulomas in both lung bases. Noncalcified nodule in the right lung base measures 9 mm diameter. No significant change since prior study. Calcification of the mitral valve annulus. Cardiac enlargement. Small esophageal hiatal hernia. Hepatobiliary: Suggestion of hepatic cirrhosis with nodular contour to the liver. No focal lesions are demonstrated on noncontrast imaging. Gallbladder is surgically absent. No bile duct dilatation. Pancreas: Fatty infiltration of the pancreas. No pancreatic ductal dilatation or infiltration. Spleen: Spleen size is normal.  No focal lesions. Adrenals/Urinary Tract: No adrenal gland nodules. Bilateral renal parenchymal atrophy with vascular calcifications along the renal arteries and arterials. Large calcification in the upper pole right kidney measuring 12 mm. No change since prior study. No hydronephrosis or hydroureter. No ureteral stones identified. Evaluation of the bladder is severely limited due to streak artifact from bilateral hip arthroplasties. Stomach/Bowel: Stomach, small bowel, and colon are not abnormally distended. No discrete wall thickening is appreciated. Scattered stool in the colon. Appendix is normal. Vascular/Lymphatic: Extensive vascular calcifications throughout the abdomen and pelvis involving all major arterial structures, including the abdominal aorta and branch vessels. No aortic aneurysm. IVC is normal in caliber. No significant lymphadenopathy. Reproductive: Uterus and bilateral adnexa are unremarkable. Other: Evaluation of pelvic organs is severely limited by  streak artifact from bilateral hip arthroplasties. No free fluid or free air in the abdomen. Ventral abdominal wall hernia containing fat and measuring about 2.7 cm maximal diameter. This is new since prior study. No bowel herniation. Generator pack for stimulator device in the soft tissues posterior to the right pelvis. Musculoskeletal: There is increasing bone destruction and sclerosis of the endplates adjacent to the L2-3 interspace level. There is apparent bone destruction of the endplates. There is soft tissue swelling of the adjacent paraspinal muscles and psoas muscles, increased since prior study. This is suspicious for discitis/ osteomyelitis and surrounding inflammatory reaction. No definite loculated fluid collection identified. Diffuse degenerative change throughout the lumbar spine. IMPRESSION: 1. Described changes at L2-3 interspace suspicious for discitis/ osteomyelitis and inflammatory changes in the adjacent paraspinal and psoas muscles. 2. Small bilateral pleural effusions with basilar atelectasis. Small esophageal hiatal hernia. 9 mm nodule in the right lung base is unchanged since prior study. 3. Large nonobstructing stone in the superior pole right kidney. 4. Extensive aortic atherosclerosis and calcification throughout the abdominal vasculature. 5. New ventral abdominal wall hernia containing fat. These results were called by telephone at the time of interpretation on 08/09/2016 at 11:21 pm to Dr. Daryel NovemberJONATHAN WILLIAMS , who verbally acknowledged these results. Electronically Signed   By: Burman NievesWilliam  Stevens M.D.   On: 08/09/2016 23:24    EKG:   Orders placed or performed during the hospital encounter of 08/09/16  . EKG 12-Lead  . EKG 12-Lead    ASSESSMENT AND PLAN:   83 y/o F with PMH of low back pain chronic, ESRD on M-W-F hemodialysis, afib on coumadin, HTN, neuropathy, PVD admitted with abdominal pain and  noted to have L2-L3 discitis  #1 Abdominal pain- in the lower abdomen  bilaterally - could be referred pain from paraspinal muscles inflammation and L2-L3 discitis - CT abd with right non obstructing renal stone-patient denies any flank pain, dysuria - no tenderness on exam, ? Radicular pain esp with hip flexion- psoas muscle and paraspinal muscles inflammation noted - No back pain now. No fevers or elevated wbc- appreciate ID consult- for now ABX discontinued -recommended MRI without contrast (dialysis patient) - but cannot be done due to a spinal stimulator - will need aspiration from the phlegmon at the L2-L3 region by IR- coumadin held today and check INR tomorrow - heating pad and conservative treatment until then  #2 Right heel ulcer- well healed, dressing changes - wound care consult  #3 ESRD on HD- for dialysis tomorrow, nephrology consulted Hyponatremia and hyperkalemia resolved with dialysis.  #4 Afib- rate controlled, on oral cardizem, - coumadin on hold for now  #5 Neuropathy- on gabapentin  #6 DVT Prophylaxis- hold  coumadin  Physical Therapy consulted Updated daughter Elita Quick over the phone    All the records are reviewed and case discussed with Care Management/Social Workerr. Management plans discussed with the patient, family and they are in agreement.  CODE STATUS: DNR  TOTAL TIME TAKING CARE OF THIS PATIENT: 37 minutes.   POSSIBLE D/C IN 2 DAYS, DEPENDING ON CLINICAL CONDITION.   Enid Baas M.D on 08/11/2016 at 1:11 PM  Between 7am to 6pm - Pager - [phone]  After 6pm go to [email or web address] - Social research officer, government  [facility]  Office  [phone]  CC: Primary care physician; [name], MD

## 2016-08-11 NOTE — Consult Note (Signed)
WOC Nurse wound consult note Reason for Consult:Chronic nonhealing full thickness neuropathic ulcer.  [facility]  Apligraf x2.  History of osteomyelitis.  Changed 3 x weekly.    Wound type:Chronic nonhealing  neuropathic Pressure Injury POA: Yes Measurement: 4 cm x 0.3 cm x 0.3 cm with nonintact distal opening 0.5 cm x 0.5 cm x 0.3 cm.  Otherwise calloused tissue present.  Wound ZOX:WRUEAbed:ruddy red Drainage (amount, consistency, odor) minimal serosanguinous  No odor.  Periwound:thin, shiny leg Dressing procedure/placement/frequency:Cleanse wound to right posterior Achilles' area with NS.  Apply Mepitel silicone contact layer to wound bed for atraumatic dressing removal.  Cover with 4x4 gauze and loosely apply ace wrap.  Change three times weekly.  Will not follow at this time.  Please re-consult if needed.  Maple HudsonKaren Dezirae Service RN BSN CWON Pager [phone]

## 2016-08-11 NOTE — Progress Notes (Signed)
ANTICOAGULATION CONSULT NOTE - Initial Consult  Pharmacy Consult for warfarin dosing Indication: atrial fibrillation       Allergies  Allergen Reactions  . Codeine Itching and Other (See Comments)    Reaction:  Confusion/Hallucinations   . Contrast Media [Iodinated Diagnostic Agents] Other (See Comments)    "skin peel" breaks into a blistered rash, then peel. If given Benadryl and solumedrol she will be okay  . Oxycodone Other (See Comments)    Reaction:  Confusion/Hallucinations   . Quinine Derivatives Rash  . Tape Other (See Comments)    Skin is very thin and will blister and pull off skin with any tape except paper tape.  PLEASE USE PAPER TAPE.    Patient Measurements: Height: 5\' 5"  (165.1 cm) Weight: 154 lb 3.2 oz (69.9 kg) IBW/kg (Calculated) : 57 Heparin Dosing Weight: n/a  Vital Signs: Temp: 97.7 F (36.5 C) (03/12 0423) Temp Source: Oral (03/12 0423) BP: 140/59 (03/12 0423) Pulse Rate: 71 (03/12 0423)  Labs:  Recent Labs (last 2 labs)    Recent Labs  08/09/16 2011 08/10/16 0327  HGB 10.1* 10.1*  HCT 29.0* 29.1*  PLT 251 252  LABPROT  --  33.0*  INR  --  3.14  CREATININE 5.46* 5.37*      Estimated Creatinine Clearance: 8.2 mL/min (by C-G formula based on SCr of 5.37 mg/dL (H)).   Medical History:     Past Medical History:  Diagnosis Date  . Anemia   . Anxiety   . Arthritis    gout  . Atrial fibrillation (HCC)   . CHF (congestive heart failure) (HCC)    has had this off and on prior to dialysis but has had several times since  . Chronic kidney disease    end stage renal insufficiency.  on dialysis M,W,F  . Diabetes mellitus without complication (HCC)   . Dialysis patient [facility](HCC)    Mon.- Wed- Fri.  . Dysrhythmia    afib/flutter  . GERD (gastroesophageal reflux disease)   . Hypertension   . 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 Peripheral vascular disease (HCC)   . Pleural effusion   .  Pulmonary hypertension   . Renal insufficiency   . Restless leg syndrome   . Shortness of breath dyspnea    with exertion  . Stroke (HCC) 06/2015   has had 1 other stroke. first one affected vision. the most recent one affected right arm    Medications:  Home regimen 4 mg Tu, Th; 2 mg all other days.  Assessment: INR supratherapeutic on admission 3/12 INR 3.14 3/13 INR 2.42  Goal of Therapy:  INR 2-3    Plan:  Hold warfarin today. INR daily while on abx.  3/13 Pt. Currently off abx, INR back down to goal range. Will give warfarin 4 mg x 1 dose 3/13 tonight @ 1800 and recheck INR w/ am labs.  Thank you for this consult.  Thomasene Rippleavid Kaleesi [place], PharmD, BCPS Clinical Pharmacist 08/11/2016

## 2016-08-12 ENCOUNTER — Ambulatory Visit: Payer: Medicare Other | Admitting: Internal Medicine

## 2016-08-12 LAB — BASIC METABOLIC PANEL
Anion gap: 12 (ref 5–15)
BUN: 48 mg/dL — AB (ref 6–20)
CALCIUM: 8.8 mg/dL — AB (ref 8.9–10.3)
CO2: 28 mmol/L (ref 22–32)
Chloride: 93 mmol/L — ABNORMAL LOW (ref 101–111)
Creatinine, Ser: 4.57 mg/dL — ABNORMAL HIGH (ref 0.44–1.00)
GFR calc Af Amer: 10 mL/min — ABNORMAL LOW (ref >60)
GFR, EST NON AFRICAN AMERICAN: 8 mL/min — AB (ref >60)
GLUCOSE: 105 mg/dL — AB (ref 65–99)
Potassium: 4.7 mmol/L (ref 3.5–5.1)
Sodium: 133 mmol/L — ABNORMAL LOW (ref 135–145)

## 2016-08-12 LAB — PROTIME-INR
INR: 1.62
PROTHROMBIN TIME: 19.4 s — AB (ref 11.4–15.2)

## 2016-08-12 LAB — GLUCOSE, CAPILLARY
Glucose-Capillary: 106 mg/dL — ABNORMAL HIGH (ref 65–99)
Glucose-Capillary: 112 mg/dL — ABNORMAL HIGH (ref 65–99)
Glucose-Capillary: 73 mg/dL (ref 65–99)
Glucose-Capillary: 130 mg/dL — ABNORMAL HIGH (ref 65–99)

## 2016-08-12 SURGERY — FLUOROSCOPY GUIDANCE

## 2016-08-12 SURGERY — Surgical Case
Anesthesia: *Unknown

## 2016-08-12 SURGERY — PORTA CATH INJECTION

## 2016-08-12 MED ORDER — SODIUM CHLORIDE 0.9 % IV SOLN: INTRAVENOUS | Status: DC

## 2016-08-12 MED ORDER — SODIUM CHLORIDE 0.9 % IV SOLN
INTRAVENOUS | Status: DC
  Administered 2016-08-14: 10:00:00 via INTRAVENOUS

## 2016-08-12 NOTE — Progress Notes (Addendum)
HD completed without issue. Patient ran for 3 hours before requesting to end treatment. "I only run 3 hours normally at my center and I cant do anymore today." Treatment discontinued safetly per patient request. Hemostasis achieved after 10 minutes. Dressings clean dry and intact. Tolerated well.

## 2016-08-12 NOTE — Progress Notes (Signed)
[facility]  ROUNDING NOTE   Subjective:  Patient due for hemodialysis today. She is due for lumbar puncture tomorrow.   Objective:  Vital signs in last 24 hours:  Temp:  [97.8 F (36.6 C)-98.2 F (36.8 C)] 97.8 F (36.6 C) (03/14 1507) Pulse Rate:  [75-93] 93 (03/14 1617) Resp:  [12-19] 15 (03/14 1617) BP: (108-136)/(43-82) 133/64 (03/14 1617) SpO2:  [93 %-100 %] 97 % (03/14 1617) Weight:  [71.5 kg (157 lb 10.1 oz)] 71.5 kg (157 lb 10.1 oz) (03/14 1507)  Weight change:  Filed Weights   08/10/16 1027 08/11/16 0529 08/12/16 1507  Weight: 69.9 kg (154 lb 1.6 oz) 69.4 kg (152 lb 14.4 oz) 71.5 kg (157 lb 10.1 oz)    Intake/Output: No intake/output data recorded.   Intake/Output this shift:  Total I/O In: -  Out: 1 [Urine:1]  Physical Exam: General: No acute distress  Head: Normocephalic, atraumatic. Moist oral mucosal membranes  Eyes: Anicteric  Neck: Supple, trachea midline  Lungs:  Clear to auscultation, normal effort  Heart: S1S2 no rubs  Abdomen:  Soft, nontender, bowel sounds present  Extremities: 1+ peripheral edema, RLE ulcers covered  Neurologic: Nonfocal, moving all four extremities  Skin: No lesions  Access: RUE AVF    Basic Metabolic Panel:  Recent Labs Lab 08/09/16 2011 08/10/16 0327 08/10/16 1040 08/11/16 0401 08/12/16 0509  NA 132* 127*  --  134* 133*  K 6.1* 5.6*  --  4.0 4.7  CL 93* 91*  --  94* 93*  CO2 24 22  --  30 28  GLUCOSE 127* 110*  --  111* 105*  BUN 65* 65*  --  28* 48*  CREATININE 5.46* 5.37*  --  3.20* 4.57*  CALCIUM 8.7* 8.9  --  8.5* 8.8*  PHOS  --   --  5.8*  --   --     Liver Function Tests:  Recent Labs Lab 08/09/16 2011  AST 18  ALT 12*  ALKPHOS 69  BILITOT 0.6  PROT 7.4  ALBUMIN 3.2*   No results for input(s): LIPASE, AMYLASE in the last 168 hours. No results for input(s): AMMONIA in the last 168 hours.  CBC:  Recent Labs Lab 08/09/16 2011 08/10/16 0327  WBC 7.5 8.0  NEUTROABS 5.7   --   HGB 10.1* 10.1*  HCT 29.0* 29.1*  MCV 96.7 97.2  PLT 251 252    Cardiac Enzymes: No results for input(s): CKTOTAL, CKMB, CKMBINDEX, TROPONINI in the last 168 hours.  BNP: Invalid input(s): POCBNP  CBG:  Recent Labs Lab 08/11/16 1126 08/11/16 1633 08/11/16 2122 08/12/16 0746 08/12/16 1141  GLUCAP 140* 150* 126* 106* 112*    Microbiology: Results for orders placed or performed during the hospital encounter of 08/09/16  Urine culture     Status: Abnormal   Collection Time: 08/09/16 10:04 PM  Result Value Ref Range Status   Specimen Description URINE, RANDOM  Final   Special Requests NONE  Final   Culture (A)  Final    >=100,000 COLONIES/mL GROUP B STREP(S.AGALACTIAE)ISOLATED TESTING AGAINST S. AGALACTIAE NOT ROUTINELY PERFORMED DUE TO PREDICTABILITY OF AMP/PEN/VAN SUSCEPTIBILITY. Performed at [facility], [address], [place], [place] [address]    Report Status 08/11/2016 FINAL  Final  Blood culture (routine x 2)     Status: None (Preliminary result)   Collection Time: 08/09/16 11:54 PM  Result Value Ref Range Status   Specimen Description BLOOD LEFT UPPER ARM  Final   Special Requests  Final    BOTTLES DRAWN AEROBIC AND ANAEROBIC BCHV HIGH VOLUME   Culture NO GROWTH 2 DAYS  Final   Report Status PENDING  Incomplete  Blood culture (routine x 2)     Status: None (Preliminary result)   Collection Time: 08/09/16 11:59 PM  Result Value Ref Range Status   Specimen Description BLOOD LEFT FOREARM  Final   Special Requests   Final    BOTTLES DRAWN AEROBIC AND ANAEROBIC BCAV ADEQUATE VOLUME   Culture NO GROWTH 2 DAYS  Final   Report Status PENDING  Incomplete  MRSA PCR Screening     Status: None   Collection Time: 08/10/16  2:29 AM  Result Value Ref Range Status   MRSA by PCR NEGATIVE NEGATIVE Final    Comment:        The GeneXpert MRSA Assay (FDA approved for NASAL specimens only), is one component of a comprehensive MRSA colonization surveillance  program. It is not intended to diagnose MRSA infection nor to guide or monitor treatment for MRSA infections.     Coagulation Studies:  Recent Labs  08/10/16 0327 08/11/16 0401 08/12/16 0509  LABPROT 33.0* 26.8* 19.4*  INR 3.14 2.42 1.62    Urinalysis:  Recent Labs  08/09/16 2204  COLORURINE AMBER*  LABSPEC 1.014  PHURINE 6.0  GLUCOSEU NEGATIVE  HGBUR MODERATE*  BILIRUBINUR NEGATIVE  KETONESUR NEGATIVE  PROTEINUR 100*  NITRITE NEGATIVE  LEUKOCYTESUR LARGE*      Imaging: No results found.   Medications:   . [START ON 08/14/2016] sodium chloride     . calcium acetate  1,334 mg Oral TID WC  . diltiazem  120 mg Oral BH-q7a  . epoetin (EPOGEN/PROCRIT) injection  4,000 Units Intravenous Q M,W,F-HD  . famotidine  20 mg Oral Daily  . gabapentin  100 mg Oral Q T,Th,S,Su  . gabapentin  300 mg Oral Q M,W,F-2000  . insulin aspart  0-5 Units Subcutaneous QHS  . insulin aspart  0-9 Units Subcutaneous TID WC  . methocarbamol  500 mg Oral BID  . pravastatin  40 mg Oral QPM   acetaminophen **OR** acetaminophen, albuterol, ondansetron **OR** ondansetron (ZOFRAN) IV, traMADol  Assessment/ Plan:  83 y.o. female with atrial fibrillation on Coumadin, hypertension, gout, insulin-dependent diabetes mellitus type 2, diabetic neuropathy, CVA with right visual field blindness, hyperlipidemia, major depressive disorder, overactive bladder with incontinence, generalized anxiety disorder, congestive heart failure diastolic, parathyroidectomy, and osteoarthritis  MWF [facility] [address]   1. End Stage Renal Disease:  - patient ue for hemodialysis today.  Orders have been prepared.  Continue dialysis on a Monday, Wednesday, and Friday schedule.  2. Discitis. Noted on imaging upon admission.  Lumbar puncture to be performed tomorrow.  3. Hypertension:   continue diltiazem.  4. Secondary Hyperparathyroidism: recheck phosphorus during dialysis today.  5. Anemia of chronic  kidney disease:  Continue Epogen 4000 units IV with dialysis.  LOS: 2 Glori Machnik 3/14/20184:28 PM

## 2016-08-12 NOTE — Progress Notes (Signed)
ID brief note Discussed with radiology  Is on sched for Friday so that the INR can normalize and due to scheduling issues.  Sample should be sent for routine cx, fungal cx, AFB cx and path if possible Continue off abx but after aspiration can be dced on empiric vanco and ceftazidime as otpt at HD and can fu with me

## 2016-08-12 NOTE — Progress Notes (Signed)
[employer] -  at [facility]   PATIENT NAME: [name]    MR#:  [id number]  DATE OF BIRTH:  [birth year]  SUBJECTIVE:  CHIEF COMPLAINT:   Chief Complaint  Patient presents with  . Abdominal Pain    Lower groin and lower abdomen   - still complains ofLower back pain and bilateral groin pain radiating along right leg as well with ambulation. Resting comfortably with no pain - no back pain, MRI cannot be done due to back stimulator - no fevers, ABX discontinued by ID  REVIEW OF SYSTEMS:  Review of Systems  Constitutional: Positive for malaise/fatigue. Negative for chills and fever.  HENT: Negative for congestion, ear discharge, hearing loss and nosebleeds.   Eyes: Negative for blurred vision.  Respiratory: Negative for cough, shortness of breath and wheezing.   Cardiovascular: Negative for chest pain, palpitations and leg swelling.  Gastrointestinal: Negative for abdominal pain, constipation, diarrhea, nausea and vomiting.  Genitourinary: Negative for dysuria.  Musculoskeletal: Positive for back pain.  Neurological: Negative for dizziness, seizures and headaches.    DRUG ALLERGIES:   Allergies  Allergen Reactions  . Codeine Itching and Other (See Comments)    Reaction:  Confusion/Hallucinations   . Contrast Media [Iodinated Diagnostic Agents] Other (See Comments)    "skin peel" breaks into a blistered rash, then peel. If given Benadryl and solumedrol she will be okay  . Oxycodone Other (See Comments)    Reaction:  Confusion/Hallucinations   . Quinine Derivatives Rash  . Tape Other (See Comments)    Skin is very thin and will blister and pull off skin with any tape except paper tape.  PLEASE USE PAPER TAPE.    VITALS:  Blood pressure 111/60, pulse 83, temperature 97.9 F (36.6 C), temperature source Oral, resp. rate 18, height 5\' 5"  (1.651 m), weight 69.4 kg (152 lb 14.4 oz), SpO2 93 %.  PHYSICAL EXAMINATION:  Physical Exam  GENERAL:  83  y.o.-year-old patient lying in the bed with no acute distress.  EYES: Pupils equal, round, reactive to light and accommodation. No scleral icterus. Extraocular muscles intact.  HEENT: Head atraumatic, normocephalic. Oropharynx and nasopharynx clear.  NECK:  Supple, no jugular venous distention. No thyroid enlargement, no tenderness.  LUNGS: Normal breath sounds bilaterally, no wheezing, rales,rhonchi or crepitation. No use of accessory muscles of respiration.  CARDIOVASCULAR: S1, S2 normal. No  rubs, or gallops. 3/6 systolic murmur present ABDOMEN: Soft, nontender, nondistended. Bowel sounds present. No organomegaly or mass.  EXTREMITIES: No pedal edema, cyanosis, or clubbing. Right leg dressing in place NEUROLOGIC: Cranial nerves II through XII are intact. Muscle strength 5/5 in all extremities. Sensation intact. Gait not checked.  PSYCHIATRIC: The patient is alert and oriented x 3.  SKIN: No obvious rash, lesion, or ulcer.    LABORATORY PANEL:   CBC  Recent Labs Lab 08/10/16 0327  WBC 8.0  HGB 10.1*  HCT 29.1*  PLT 252   ------------------------------------------------------------------------------------------------------------------  Chemistries   Recent Labs Lab 08/09/16 2011  08/12/16 0509  NA 132*  < > 133*  K 6.1*  < > 4.7  CL 93*  < > 93*  CO2 24  < > 28  GLUCOSE 127*  < > 105*  BUN 65*  < > 48*  CREATININE 5.46*  < > 4.57*  CALCIUM 8.7*  < > 8.8*  AST 18  --   --   ALT 12*  --   --   ALKPHOS 69  --   --  BILITOT 0.6  --   --   < > = values in this interval not displayed. ------------------------------------------------------------------------------------------------------------------  Cardiac Enzymes No results for input(s): TROPONINI in the last 168 hours. ------------------------------------------------------------------------------------------------------------------  RADIOLOGY:  No results found.  EKG:   Orders placed or performed during the  hospital encounter of 08/09/16  . EKG 12-Lead  . EKG 12-Lead    ASSESSMENT AND PLAN:   83 y/o F with PMH of low back pain chronic, ESRD on M-W-F hemodialysis, afib on coumadin, HTN, neuropathy, PVD admitted with abdominal pain and noted to have L2-L3 discitis  #1 Abdominal pain- in the lower abdomen bilaterally-probably from lumbar radiculopathy/Lumbar discitis - could be referred pain from paraspinal muscles inflammation and L2-L3 discitis - CT abd with right non obstructing renal stone-patient denies any flank pain, dysuria - No back pain now. No fevers or elevated wbc- appreciate ID consult- for now ABX discontinued -recommended MRI without contrast (dialysis patient) - but cannot be done due to a spinal stimulator - will need aspiration from the phlegmon at the L2-L3 region by IR- coumadin held from March 13 and check INR tomorrow. Scheduled for aspiration of the phlegmon on Friday after INR is below 1.5 as well as some scheduling issues until Friday - heating pad and conservative treatment until then  #2 Right heel ulcer- wet-to-dry dressing changes -Follow up with wound care   #3 ESRD on HD- for dialysis today. nephrology consulted Hyponatremia and hyperkalemia resolved with dialysis.  #4 Afib- rate controlled, on oral cardizem, - coumadin on hold for now  #5 Neuropathy- on gabapentin  #6 DVT Prophylaxis- hold  coumadin  Physical Therapy consulted    All the records are reviewed and case discussed with Care Management/Social Workerr. Management plans discussed with the patient, family and they are in agreement.  CODE STATUS: DNR  TOTAL TIME TAKING CARE OF THIS PATIENT: 35 minutes.   POSSIBLE D/C IN 2 DAYS, DEPENDING ON CLINICAL CONDITION.   Ramonita LabGouru, Vaniah Chambers M.D on 08/12/2016 at 2:14 PM  Between 7am to 6pm - Pager - [phone]  After 6pm go to [email or web address] - Social research officer, governmentpassword EPAS [facility]  [facility]  Office  [phone]  CC: Primary care physician;  Rolm GalaGRANDIS, HEIDI, MD

## 2016-08-12 NOTE — Progress Notes (Signed)
PRE DIALYSIS ASSESSMENT 

## 2016-08-12 NOTE — Progress Notes (Signed)
This note also relates to the following rows which could not be included: BP - Cannot attach notes to unvalidated device data  HD STARTED  

## 2016-08-13 ENCOUNTER — Other Ambulatory Visit: Payer: Self-pay | Admitting: Radiology

## 2016-08-13 ENCOUNTER — Inpatient Hospital Stay: Payer: Medicare Other

## 2016-08-13 LAB — PROTIME-INR
INR: 1.45
Prothrombin Time: 17.8 seconds — ABNORMAL HIGH (ref 11.4–15.2)

## 2016-08-13 LAB — CBC
HCT: 30.5 % — ABNORMAL LOW (ref 35.0–47.0)
Hemoglobin: 10.3 g/dL — ABNORMAL LOW (ref 12.0–16.0)
MCH: 32.6 pg (ref 26.0–34.0)
MCHC: 33.9 g/dL (ref 32.0–36.0)
MCV: 96.3 fL (ref 80.0–100.0)
PLATELETS: 239 10*3/uL (ref 150–440)
RBC: 3.17 MIL/uL — AB (ref 3.80–5.20)
RDW: 15.2 % — AB (ref 11.5–14.5)
WBC: 8.7 10*3/uL (ref 3.6–11.0)

## 2016-08-13 LAB — GLUCOSE, CAPILLARY
GLUCOSE-CAPILLARY: 162 mg/dL — AB (ref 65–99)
Glucose-Capillary: 114 mg/dL — ABNORMAL HIGH (ref 65–99)
Glucose-Capillary: 99 mg/dL (ref 65–99)
Glucose-Capillary: 174 mg/dL — ABNORMAL HIGH (ref 65–99)

## 2016-08-13 NOTE — Care Management (Signed)
[facility] [place] 2nd [facility]

## 2016-08-13 NOTE — Progress Notes (Addendum)
[employer] - [place] at [facility]   PATIENT NAME: [name]    MR#:  [id number]  DATE OF BIRTH:  Dec 31, 1933  SUBJECTIVE:  CHIEF COMPLAINT:   Chief Complaint  Patient presents with  . Abdominal Pain    Lower groin and lower abdomen   - still complains ofLower back pain and bilateral groin pain radiating along right leg as well with ambulation. Resting comfortably with no pain - no back pain, MRI cannot be done due to back stimulator - no fevers, ABX discontinued by ID  REVIEW OF SYSTEMS:  Review of Systems  Constitutional: Positive for malaise/fatigue. Negative for chills and fever.  HENT: Negative for congestion, ear discharge, hearing loss and nosebleeds.   Eyes: Negative for blurred vision.  Respiratory: Negative for cough, shortness of breath and wheezing.   Cardiovascular: Negative for chest pain, palpitations and leg swelling.  Gastrointestinal: Negative for abdominal pain, constipation, diarrhea, nausea and vomiting.  Genitourinary: Negative for dysuria.  Musculoskeletal: Positive for back pain.  Neurological: Negative for dizziness, seizures and headaches.    DRUG ALLERGIES:   Allergies  Allergen Reactions  . Codeine Itching and Other (See Comments)    Reaction:  Confusion/Hallucinations   . Contrast Media [Iodinated Diagnostic Agents] Other (See Comments)    "skin peel" breaks into a blistered rash, then peel. If given Benadryl and solumedrol she will be okay  . Oxycodone Other (See Comments)    Reaction:  Confusion/Hallucinations   . Quinine Derivatives Rash  . Tape Other (See Comments)    Skin is very thin and will blister and pull off skin with any tape except paper tape.  PLEASE USE PAPER TAPE.    VITALS:  Blood pressure 128/64, pulse 92, temperature 98.7 F (37.1 C), temperature source Oral, resp. rate 18, height 5\' 5"  (1.651 m), weight 69.8 kg (153 lb 14.1 oz), SpO2 94 %.  PHYSICAL EXAMINATION:  Physical Exam  GENERAL:  83  y.o.-year-old patient lying in the bed with no acute distress.  EYES: Pupils equal, round, reactive to light and accommodation. No scleral icterus. Extraocular muscles intact.  HEENT: Head atraumatic, normocephalic. Oropharynx and nasopharynx clear.  NECK:  Supple, no jugular venous distention. No thyroid enlargement, no tenderness.  LUNGS: Normal breath sounds bilaterally, no wheezing, rales,rhonchi or crepitation. No use of accessory muscles of respiration.  CARDIOVASCULAR: S1, S2 normal. No  rubs, or gallops. 3/6 systolic murmur present ABDOMEN: Soft, nontender, nondistended. Bowel sounds present. No organomegaly or mass.  EXTREMITIES: No pedal edema, cyanosis, or clubbing. Right leg dressing in place NEUROLOGIC: Cranial nerves II through XII are intact. Muscle strength 5/5 in all extremities. Sensation intact. Gait not checked.  PSYCHIATRIC: The patient is alert and oriented x 3.  SKIN: No obvious rash, lesion, or ulcer.    LABORATORY PANEL:   CBC  Recent Labs Lab 08/13/16 0525  WBC 8.7  HGB 10.3*  HCT 30.5*  PLT 239   ------------------------------------------------------------------------------------------------------------------  Chemistries   Recent Labs Lab 08/09/16 2011  08/12/16 0509  NA 132*  < > 133*  K 6.1*  < > 4.7  CL 93*  < > 93*  CO2 24  < > 28  GLUCOSE 127*  < > 105*  BUN 65*  < > 48*  CREATININE 5.46*  < > 4.57*  CALCIUM 8.7*  < > 8.8*  AST 18  --   --   ALT 12*  --   --   ALKPHOS 69  --   --  BILITOT 0.6  --   --   < > = values in this interval not displayed. ------------------------------------------------------------------------------------------------------------------  Cardiac Enzymes No results for input(s): TROPONINI in the last 168 hours. ------------------------------------------------------------------------------------------------------------------  RADIOLOGY:  Dg Chest Port 1 View  Result Date: 08/13/2016 CLINICAL DATA:   Productive cough for the past 2-3 days EXAM: PORTABLE CHEST 1 VIEW COMPARISON:  PA and lateral chest x-ray of August 13, 2015 FINDINGS: The lungs are well-expanded. The interstitial markings are increased bilaterally and more conspicuous than on the previous study. Confluent densities inferiorly on the left have improved. The cardiac silhouette remains enlarged. The central pulmonary vascularity remains engorged. There is calcification in the wall of the aortic arch. The trachea is midline. There is degenerative change of both shoulders. IMPRESSION: COPD. Chronic interstitial prominence with possible superimposed acute bronchitis or interstitial pneumonia. Low-grade CHF, stable. Thoracic aortic atherosclerosis. Electronically Signed   By: David  SwazilandJordan M.D.   On: 08/13/2016 11:31    EKG:   Orders placed or performed during the hospital encounter of 08/09/16  . EKG 12-Lead  . EKG 12-Lead    ASSESSMENT AND PLAN:   83 y/o F with PMH of low back pain chronic, ESRD on M-W-F hemodialysis, afib on coumadin, HTN, neuropathy, PVD admitted with abdominal pain and noted to have L2-L3 discitis  #1 Abdominal pain- in the lower abdomen bilaterally-probably from lumbar radiculopathy/Lumbar discitis - could be referred pain from paraspinal muscles inflammation and L2-L3 discitis - CT abd with right non obstructing renal stone-patient denies any flank pain, dysuria - No back pain now. No fevers or elevated wbc- appreciate ID consult- for now ABX discontinued -recommended MRI without contrast (dialysis patient) - but cannot be done due to a spinal stimulator - will need aspiration from the phlegmon at the L2-L3 region by IR- coumadin held from March 13 and check INR tomorrow. Scheduled for aspiration of the phlegmon on Friday after INR is below 1.5 as well as some scheduling issues until Friday - heating pad and conservative treatment until then  #2 Right heel ulcer- wet-to-dry dressing changes -Follow up with  wound care   #3 ESRD on HD- for dialysis tomorrow. nephrology following Hyponatremia and hyperkalemia resolved with dialysis.  #4 Afib- rate controlled, on oral cardizem, - coumadin on hold for now. INR at 1.45  #5 Neuropathy- on gabapentin  #6 DVT Prophylaxis- hold  coumadin  Physical Therapy consulted    All the records are reviewed and case discussed with Care Management/Social Workerr. Management plans discussed with the patient, family and they are in agreement.  CODE STATUS: DNR  TOTAL TIME TAKING CARE OF THIS PATIENT: 29 minutes.   POSSIBLE D/C IN 2 DAYS, DEPENDING ON CLINICAL CONDITION.   Ramonita LabGouru, Aikeem Lilley M.D on 08/13/2016 at 5:08 PM  Between 7am to 6pm - Pager - [phone]  After 6pm go to [email or web address] - Social research officer, governmentpassword EPAS [facility]  [facility] [place] [facility]  Office  [phone]  CC: Primary care physician; Rolm GalaGRANDIS, HEIDI, MD

## 2016-08-13 NOTE — Progress Notes (Signed)
[facility]  ROUNDING NOTE   Subjective:  Patient completed hemodialysis yesterday. Tolerated this quite well. Patient will need aspiration of the fluid collection at L2-L3.   Objective:  Vital signs in last 24 hours:  Temp:  [97.9 F (36.6 C)-98.9 F (37.2 C)] 98.7 F (37.1 C) (03/15 1216) Pulse Rate:  [84-102] 92 (03/15 1216) Resp:  [15-20] 18 (03/15 1216) BP: (110-138)/(43-71) 128/64 (03/15 1216) SpO2:  [93 %-100 %] 94 % (03/15 1216) Weight:  [69.8 kg (153 lb 14.1 oz)] 69.8 kg (153 lb 14.1 oz) (03/14 1807)  Weight change:  Filed Weights   08/11/16 0529 08/12/16 1507 08/12/16 1807  Weight: 69.4 kg (152 lb 14.4 oz) 71.5 kg (157 lb 10.1 oz) 69.8 kg (153 lb 14.1 oz)    Intake/Output: I/O last 3 completed shifts: In: 120 [P.O.:120] Out: 1790 [Urine:433; Other:1355; Stool:2]   Intake/Output this shift:  Total I/O In: 480 [P.O.:480] Out: -   Physical Exam: General: No acute distress  Head: Normocephalic, atraumatic. Moist oral mucosal membranes  Eyes: Anicteric  Neck: Supple, trachea midline  Lungs:  Clear to auscultation, normal effort  Heart: S1S2 no rubs  Abdomen:  Soft, nontender, bowel sounds present  Extremities: 1+ peripheral edema, RLE ulcers covered  Neurologic: Nonfocal, moving all four extremities  Skin: No lesions  Access: RUE AVF    Basic Metabolic Panel:  Recent Labs Lab 08/09/16 2011 08/10/16 0327 08/10/16 1040 08/11/16 0401 08/12/16 0509  NA 132* 127*  --  134* 133*  K 6.1* 5.6*  --  4.0 4.7  CL 93* 91*  --  94* 93*  CO2 24 22  --  30 28  GLUCOSE 127* 110*  --  111* 105*  BUN 65* 65*  --  28* 48*  CREATININE 5.46* 5.37*  --  3.20* 4.57*  CALCIUM 8.7* 8.9  --  8.5* 8.8*  PHOS  --   --  5.8*  --   --     Liver Function Tests:  Recent Labs Lab 08/09/16 2011  AST 18  ALT 12*  ALKPHOS 69  BILITOT 0.6  PROT 7.4  ALBUMIN 3.2*   No results for input(s): LIPASE, AMYLASE in the last 168 hours. No results for input(s):  AMMONIA in the last 168 hours.  CBC:  Recent Labs Lab 08/09/16 2011 08/10/16 0327 08/13/16 0525  WBC 7.5 8.0 8.7  NEUTROABS 5.7  --   --   HGB 10.1* 10.1* 10.3*  HCT 29.0* 29.1* 30.5*  MCV 96.7 97.2 96.3  PLT 251 252 239    Cardiac Enzymes: No results for input(s): CKTOTAL, CKMB, CKMBINDEX, TROPONINI in the last 168 hours.  BNP: Invalid input(s): POCBNP  CBG:  Recent Labs Lab 08/12/16 1141 08/12/16 1906 08/12/16 2104 08/13/16 0719 08/13/16 1111  GLUCAP 112* 73 130* 114* 162*    Microbiology: Results for orders placed or performed during the hospital encounter of 08/09/16  Urine culture     Status: Abnormal   Collection Time: 08/09/16 10:04 PM  Result Value Ref Range Status   Specimen Description URINE, RANDOM  Final   Special Requests NONE  Final   Culture (A)  Final    >=100,000 COLONIES/mL GROUP B STREP(S.AGALACTIAE)ISOLATED TESTING AGAINST S. AGALACTIAE NOT ROUTINELY PERFORMED DUE TO PREDICTABILITY OF AMP/PEN/VAN SUSCEPTIBILITY. Performed at [facility], [address] [place], [place] [address]    Report Status 08/11/2016 FINAL  Final  Blood culture (routine x 2)     Status: None (Preliminary result)  Collection Time: 08/09/16 11:54 PM  Result Value Ref Range Status   Specimen Description BLOOD LEFT UPPER ARM  Final   Special Requests   Final    BOTTLES DRAWN AEROBIC AND ANAEROBIC BCHV HIGH VOLUME   Culture NO GROWTH 3 DAYS  Final   Report Status PENDING  Incomplete  Blood culture (routine x 2)     Status: None (Preliminary result)   Collection Time: 08/09/16 11:59 PM  Result Value Ref Range Status   Specimen Description BLOOD LEFT FOREARM  Final   Special Requests   Final    BOTTLES DRAWN AEROBIC AND ANAEROBIC BCAV ADEQUATE VOLUME   Culture NO GROWTH 3 DAYS  Final   Report Status PENDING  Incomplete  MRSA PCR Screening     Status: None   Collection Time: 08/10/16  2:29 AM  Result Value Ref Range Status   MRSA by PCR NEGATIVE NEGATIVE  Final    Comment:        The GeneXpert MRSA Assay (FDA approved for NASAL specimens only), is one component of a comprehensive MRSA colonization surveillance program. It is not intended to diagnose MRSA infection nor to guide or monitor treatment for MRSA infections.     Coagulation Studies:  Recent Labs  08/11/16 0401 08/12/16 0509 08/13/16 0525  LABPROT 26.8* 19.4* 17.8*  INR 2.42 1.62 1.45    Urinalysis: No results for input(s): COLORURINE, LABSPEC, PHURINE, GLUCOSEU, HGBUR, BILIRUBINUR, KETONESUR, PROTEINUR, UROBILINOGEN, NITRITE, LEUKOCYTESUR in the last 72 hours.  Invalid input(s): APPERANCEUR    Imaging: Dg Chest Port 1 View  Result Date: 08/13/2016 CLINICAL DATA:  Productive cough for the past 2-3 days EXAM: PORTABLE CHEST 1 VIEW COMPARISON:  PA and lateral chest x-ray of August 13, 2015 FINDINGS: The lungs are well-expanded. The interstitial markings are increased bilaterally and more conspicuous than on the previous study. Confluent densities inferiorly on the left have improved. The cardiac silhouette remains enlarged. The central pulmonary vascularity remains engorged. There is calcification in the wall of the aortic arch. The trachea is midline. There is degenerative change of both shoulders. IMPRESSION: COPD. Chronic interstitial prominence with possible superimposed acute bronchitis or interstitial pneumonia. Low-grade CHF, stable. Thoracic aortic atherosclerosis. Electronically Signed   By: David  SwazilandJordan M.D.   On: 08/13/2016 11:31     Medications:   . [START ON 08/14/2016] sodium chloride     . calcium acetate  1,334 mg Oral TID WC  . diltiazem  120 mg Oral [id number]  . epoetin (EPOGEN/PROCRIT) injection  4,000 Units Intravenous Q M,W,F-HD  . famotidine  20 mg Oral Daily  . gabapentin  100 mg Oral Q T,Th,S,Su  . gabapentin  300 mg Oral Q M,W,F-2000  . insulin aspart  0-5 Units Subcutaneous QHS  . insulin aspart  0-9 Units Subcutaneous TID WC  .  pravastatin  40 mg Oral QPM   acetaminophen **OR** acetaminophen, albuterol, ondansetron **OR** ondansetron (ZOFRAN) IV, traMADol  Assessment/ Plan:  83 y.o. female with atrial fibrillation on Coumadin, hypertension, gout, insulin-dependent diabetes mellitus type 2, diabetic neuropathy, CVA with right visual field blindness, hyperlipidemia, major depressive disorder, overactive bladder with incontinence, generalized anxiety disorder, congestive heart failure diastolic, parathyroidectomy, and osteoarthritis  MWF [facility] [address]   1. End Stage Renal Disease:  - patient seen at bedside.  No acute indication for dialysis today.  We will plan for dialysis again tomorrow.  2. Discitis. Patient appears to have phlegmon between L2 and L3.  Aspiration of this fluid collection to  occur hopefully tomorrow.  3. Hypertension:  blood pressure currently 128/64. Continue diltiazem.  4. Secondary Hyperparathyroidism: continue calcium acetate 2 tablets by mouth 3 times a day with meals.  5. Anemia of chronic kidney disease:  Hemoglobin currentlyAt target at 10.3.  Continue Epogen as prescribed.   LOS: 3 Kelsie Zaborowski 3/15/20183:14 PM

## 2016-08-14 ENCOUNTER — Other Ambulatory Visit (HOSPITAL_COMMUNITY): Payer: Medicare Other

## 2016-08-14 ENCOUNTER — Inpatient Hospital Stay: Payer: Medicare Other

## 2016-08-14 ENCOUNTER — Encounter: Payer: Self-pay | Admitting: *Deleted

## 2016-08-14 HISTORY — PX: IR GENERIC HISTORICAL: IMG1180011

## 2016-08-14 LAB — PROTIME-INR
INR: 1.25
PROTHROMBIN TIME: 15.8 s — AB (ref 11.4–15.2)

## 2016-08-14 LAB — PHOSPHORUS: Phosphorus: 3.9 mg/dL (ref 2.5–4.6)

## 2016-08-14 LAB — GLUCOSE, CAPILLARY
GLUCOSE-CAPILLARY: 131 mg/dL — AB (ref 65–99)
Glucose-Capillary: 105 mg/dL — ABNORMAL HIGH (ref 65–99)
Glucose-Capillary: 155 mg/dL — ABNORMAL HIGH (ref 65–99)

## 2016-08-14 MED ORDER — MIDAZOLAM HCL 5 MG/5ML IJ SOLN
INTRAMUSCULAR | Status: AC
  Filled 2016-08-14: qty 5

## 2016-08-14 MED ORDER — DEXTROSE 5 % IV SOLN
2.0000 g | Freq: Once | INTRAVENOUS | Status: AC
  Administered 2016-08-14: 2 g via INTRAVENOUS
  Filled 2016-08-14: qty 2

## 2016-08-14 MED ORDER — DEXTROSE 5 % IV SOLN
2.0000 g | INTRAVENOUS | Status: DC
  Administered 2016-08-17: 2 g via INTRAVENOUS
  Filled 2016-08-14: qty 2

## 2016-08-14 MED ORDER — WARFARIN - PHARMACIST DOSING INPATIENT
Freq: Every day | Status: DC
  Administered 2016-08-16: 18:00:00

## 2016-08-14 MED ORDER — VANCOMYCIN HCL 1000 MG IV SOLR
1500.0000 mg | Freq: Once | INTRAVENOUS | Status: DC
  Filled 2016-08-14 (×2): qty 1500

## 2016-08-14 MED ORDER — FENTANYL CITRATE (PF) 100 MCG/2ML IJ SOLN
INTRAMUSCULAR | Status: AC | PRN
  Administered 2016-08-14: 50 ug via INTRAVENOUS

## 2016-08-14 MED ORDER — MIDAZOLAM HCL 5 MG/5ML IJ SOLN
INTRAMUSCULAR | Status: AC | PRN
  Administered 2016-08-14: 1 mg via INTRAVENOUS

## 2016-08-14 MED ORDER — FENTANYL CITRATE (PF) 100 MCG/2ML IJ SOLN
INTRAMUSCULAR | Status: AC
  Filled 2016-08-14: qty 2

## 2016-08-14 MED ORDER — WARFARIN SODIUM 4 MG PO TABS
4.0000 mg | ORAL_TABLET | Freq: Once | ORAL | Status: AC
  Administered 2016-08-14: 4 mg via ORAL
  Filled 2016-08-14: qty 1

## 2016-08-14 MED ORDER — VANCOMYCIN HCL IN DEXTROSE 1-5 GM/200ML-% IV SOLN
1000.0000 mg | INTRAVENOUS | Status: DC
  Filled 2016-08-14: qty 200

## 2016-08-14 NOTE — Progress Notes (Signed)
Pre hd info 

## 2016-08-14 NOTE — Progress Notes (Signed)
Infectious Disease Long Term IV Antibiotic Orders  Diagnosis: L spine osteo and discitis   Culture results Pending from culture done 3/16   CRP 7.8 Lab Results  Component Value Date   ESRSEDRATE 86 (H) 08/11/2016    Allergies:  Allergies  Allergen Reactions  . Codeine Itching and Other (See Comments)    Reaction:  Confusion/Hallucinations   . Contrast Media [Iodinated Diagnostic Agents] Other (See Comments)    "skin peel" breaks into a blistered rash, then peel. If given Benadryl and solumedrol she will be okay  . Oxycodone Other (See Comments)    Reaction:  Confusion/Hallucinations   . Quinine Derivatives Rash  . Tape Other (See Comments)    Skin is very thin and will blister and pull off skin with any tape except paper tape.  PLEASE USE PAPER TAPE.    Discharge antibiotics Vancomycin         1000         mg  every       72        hours  (after Hemodialysis Goal vancomycin trough 15-20.    Pharmacy to adjust dosing based on levels Ceftazidime 2 gm after each HD session   PICC Care per protocol Labs weekly while on IV antibiotics      CBC w diff   Comprehensive met panel Vancomycin Trough   CRP   Planned duration of antibiotics 6 weeks from 3/16  Stop date 09/25/16  Follow up clinic date within 2 weeks  FAX weekly labs to [phone]  Leonel Ramsay, MD

## 2016-08-14 NOTE — Progress Notes (Signed)
[facility] [place] [facility]   PATIENT NAME: [name]    MR#:  [id number]  DATE OF BIRTH:  [birth year]  SUBJECTIVE:  CHIEF COMPLAINT:   Chief Complaint  Patient presents with  . Abdominal Pain    Lower groin and lower abdomen   - Patient had aspiration of the fluid from her lumbar spine today. Patient tolerated procedure well-  MRI cannot be done due to back stimulator - no fevers, ABX discontinued by ID  REVIEW OF SYSTEMS:  Review of Systems  Constitutional: Positive for malaise/fatigue. Negative for chills and fever.  HENT: Negative for congestion, ear discharge, hearing loss and nosebleeds.   Eyes: Negative for blurred vision.  Respiratory: Negative for cough, shortness of breath and wheezing.   Cardiovascular: Negative for chest pain, palpitations and leg swelling.  Gastrointestinal: Negative for abdominal pain, constipation, diarrhea, nausea and vomiting.  Genitourinary: Negative for dysuria.  Musculoskeletal: Positive for back pain.  Neurological: Negative for dizziness, seizures and headaches.    DRUG ALLERGIES:   Allergies  Allergen Reactions  . Codeine Itching and Other (See Comments)    Reaction:  Confusion/Hallucinations   . Contrast Media [Iodinated Diagnostic Agents] Other (See Comments)    "skin peel" breaks into a blistered rash, then peel. If given Benadryl and solumedrol she will be okay  . Oxycodone Other (See Comments)    Reaction:  Confusion/Hallucinations   . Quinine Derivatives Rash  . Tape Other (See Comments)    Skin is very thin and will blister and pull off skin with any tape except paper tape.  PLEASE USE PAPER TAPE.    VITALS:  Blood pressure 139/77, pulse (!) 109, temperature 97.5 F (36.4 C), temperature source Oral, resp. rate 20, height 5\' 5"  (1.651 m), weight 70.9 kg (156 lb 4.9 oz), SpO2 100 %.  PHYSICAL EXAMINATION:  Physical Exam  GENERAL:  83 y.o.-year-old patient lying in the bed with no acute  distress.  EYES: Pupils equal, round, reactive to light and accommodation. No scleral icterus. Extraocular muscles intact.  HEENT: Head atraumatic, normocephalic. Oropharynx and nasopharynx clear.  NECK:  Supple, no jugular venous distention. No thyroid enlargement, no tenderness.  LUNGS: Normal breath sounds bilaterally, no wheezing, rales,rhonchi or crepitation. No use of accessory muscles of respiration.  CARDIOVASCULAR: S1, S2 normal. No  rubs, or gallops. 3/6 systolic murmur present ABDOMEN: Soft, nontender, nondistended. Bowel sounds present. No organomegaly or mass.  EXTREMITIES: No pedal edema, cyanosis, or clubbing. Right leg dressing in place NEUROLOGIC: Cranial nerves II through XII are intact. Muscle strength 5/5 in all extremities. Sensation intact. Gait not checked.  PSYCHIATRIC: The patient is alert and oriented x 3.  SKIN: No obvious rash, lesion, or ulcer.    LABORATORY PANEL:   CBC  Recent Labs Lab 08/13/16 0525  WBC 8.7  HGB 10.3*  HCT 30.5*  PLT 239   ------------------------------------------------------------------------------------------------------------------  Chemistries   Recent Labs Lab 08/09/16 2011  08/12/16 0509  NA 132*  < > 133*  K 6.1*  < > 4.7  CL 93*  < > 93*  CO2 24  < > 28  GLUCOSE 127*  < > 105*  BUN 65*  < > 48*  CREATININE 5.46*  < > 4.57*  CALCIUM 8.7*  < > 8.8*  AST 18  --   --   ALT 12*  --   --   ALKPHOS 69  --   --   BILITOT 0.6  --   --   < > =  values in this interval not displayed. ------------------------------------------------------------------------------------------------------------------  Cardiac Enzymes No results for input(s): TROPONINI in the last 168 hours. ------------------------------------------------------------------------------------------------------------------  RADIOLOGY:  Ir Fluoro Guide Ndl Plmt / Bx  Result Date: 08/14/2016 INDICATION: Back pain.  CT suggests discitis/osteomyelitis L2-3.  EXAM: LUMBAR DISC ASPIRATION UNDER FLUOROSCOPY MEDICATIONS: Lidocaine 1% subcutaneous ANESTHESIA/SEDATION: Intravenous Fentanyl and Versed were administered as conscious sedation during continuous monitoring of the patient's level of consciousness and physiological / cardiorespiratory status by the radiology RN, with a total moderate sedation time of 6 minutes. PROCEDURE: Informed written consent was obtained from the patient after a thorough discussion of the procedural risks, benefits and alternatives. All questions were addressed. Maximal Sterile Barrier Technique was utilized including caps, mask, sterile gowns, sterile gloves, sterile drape, hand hygiene and skin antiseptic. A timeout was performed prior to the initiation of the procedure. Patient placed prone. Lumbar region prepped with chlorhexidine, draped in usual sterile fashion. The L2-3 interspace was localized under fluoroscopy and an appropriate skin entry site was determined and marked under fluoroscopic guidance, a 17 gauge trocar needle was advanced into the L2-3 interspace from a right posterolateral approach. Needle tip position confirmed within the interspace on AP and lateral fluoroscopy. Approximately 2 mL of thin bloody fluid were aspirated, sent for the requested laboratory studies. The patient tolerated the procedure well. FLUOROSCOPY TIME:  1.2 minutes (27 mGy) COMPLICATIONS: None immediate. IMPRESSION: 1. Technically successful aspiration of 2 mL thin bloody fluid from the L2-3 interspace under fluoroscopy, sent for the requested laboratory studies. Electronically Signed   By: Corlis Leak M.D.   On: 08/14/2016 12:24   Dg Chest Port 1 View  Result Date: 08/13/2016 CLINICAL DATA:  Productive cough for the past 2-3 days EXAM: PORTABLE CHEST 1 VIEW COMPARISON:  PA and lateral chest x-ray of August 13, 2015 FINDINGS: The lungs are well-expanded. The interstitial markings are increased bilaterally and more conspicuous than on the previous  study. Confluent densities inferiorly on the left have improved. The cardiac silhouette remains enlarged. The central pulmonary vascularity remains engorged. There is calcification in the wall of the aortic arch. The trachea is midline. There is degenerative change of both shoulders. IMPRESSION: COPD. Chronic interstitial prominence with possible superimposed acute bronchitis or interstitial pneumonia. Low-grade CHF, stable. Thoracic aortic atherosclerosis. Electronically Signed   By: David  [place] M.D.   On: 08/13/2016 11:31    EKG:   Orders placed or performed during the hospital encounter of 08/09/16  . EKG 12-Lead  . EKG 12-Lead    ASSESSMENT AND PLAN:   83 y/o F with PMH of low back pain chronic, ESRD on M-W-F hemodialysis, afib on coumadin, HTN, neuropathy, PVD admitted with abdominal pain and noted to have L2-L3 discitis  #1 Abdominal pain- in the lower abdomen bilaterally-probably from lumbar radiculopathy/Lumbar discitis -Status post aspiration of the fluid from the lumbar disc area today. Patient tolerated procedure well. Follow-up with cultures, Gram stain as recommended by infectious disease - could be referred pain from paraspinal muscles inflammation and L2-L3 discitis - CT abd with right non obstructing renal stone-patient denies any flank pain, dysuria - No back pain now. No fevers or elevated wbc- appreciate ID consult- resume IV antibiotics fortaz and vanc during dialysis for the next 6 weeks. Follow-up with infectious disease Dr. Sampson Goon and nephrology. Dr. Lourdes Sledge is aware -recommended MRI without contrast (dialysis patient) - but cannot be done due to a spinal stimulator - s/p aspiration from the phlegmon at the L2-L3 region by IR- coumadin held  from March 13 and resume Coumadin today   - heating pad and conservative treatment until then  #2 Right heel ulcer- wet-to-dry dressing changes -Follow up with wound care   #3 ESRD on HD- for dialysis today. nephrology  following Hyponatremia and hyperkalemia resolved with dialysis.  #4 Afib- rate controlled, on oral cardizem, - coumadin  resume as patient had procedure today  #5 Neuropathy- on gabapentin  #6 DVT Prophylaxis- hold  coumadin  Physical Therapy consulted    All the records are reviewed and case discussed with Care Management/Social Workerr. Management plans discussed with the patient, family and they are in agreement.  CODE STATUS: DNR  TOTAL TIME TAKING CARE OF THIS PATIENT: 29 minutes.   POSSIBLE D/C IN 2 DAYS, DEPENDING ON CLINICAL CONDITION.   Ramonita Lab M.D on 08/14/2016 at 5:05 PM  Between 7am to 6pm - Pager - [phone]  After 6pm go to [email or web address] - Social research officer, government  [facility]  Office  [phone]  CC: Primary care physician; [name], MD

## 2016-08-14 NOTE — Progress Notes (Signed)
HD COMPLETED  

## 2016-08-14 NOTE — Progress Notes (Signed)
PT Cancellation Note  Patient Details Name: Helane RimaJoann Y Sadiq MRN: [id number] DOB: 05/10/1934   Cancelled Treatment:    Reason Eval/Treat Not Completed: Patient at procedure or test/unavailable.  Pt currently off floor at dialysis.  Will re-attempt PT eval at a later date/time.  Hendricks LimesEmily Jeovani Weisenburger, PT 08/14/16, 1:22 PM [phone]

## 2016-08-14 NOTE — Progress Notes (Signed)
Start of hd 

## 2016-08-14 NOTE — Progress Notes (Signed)
Pharmacy Antibiotic Note  [name] is a 83 y.o. female admitted on 08/09/2016 with osteomyelitis.  Pharmacy has been consulted for Vancomycin and Ceftazidime dosing. Patient is ESRD on HD 3 times a week. Will be discharged on both Vancomycin and Ceftazidime for total of 6 weeks of therapy beginning 3/16.  Plan: Will order Vancomycin 1500mg  IV once to be given after today's HD session. Then Vancomycin 1g IV after each subsequent HD session. Will need to check a trough level prior to 3rd dose before start of HD, goal of 15-3825mcg/ml. Will order Ceftazidime 2g IV after each HD session beginning today.  Height: 5\' 5"  (165.1 cm) Weight: 156 lb 4.9 oz (70.9 kg) IBW/kg (Calculated) : 57  Temp (24hrs), Avg:98.2 F (36.8 C), Min:98.2 F (36.8 C), Max:98.2 F (36.8 C)   Recent Labs Lab 08/09/16 2011 08/10/16 0327 08/11/16 0401 08/12/16 0509 08/13/16 0525  WBC 7.5 8.0  --   --  8.7  CREATININE 5.46* 5.37* 3.20* 4.57*  --     Estimated Creatinine Clearance: 9.7 mL/min (A) (by C-G formula based on SCr of 4.57 mg/dL (H)).    Allergies  Allergen Reactions  . Codeine Itching and Other (See Comments)    Reaction:  Confusion/Hallucinations   . Contrast Media [Iodinated Diagnostic Agents] Other (See Comments)    "skin peel" breaks into a blistered rash, then peel. If given Benadryl and solumedrol she will be okay  . Oxycodone Other (See Comments)    Reaction:  Confusion/Hallucinations   . Quinine Derivatives Rash  . Tape Other (See Comments)    Skin is very thin and will blister and pull off skin with any tape except paper tape.  PLEASE USE PAPER TAPE.    Antimicrobials this admission: Vancomycin 3/16 >> 4/27 Ceftazidime 3/16 >> 4/27  Thank you for allowing pharmacy to be a part of this patient's care.  Clovia CuffLisa Makael Stein, PharmD, BCPS 08/14/2016 1:42 PM

## 2016-08-14 NOTE — Care Management Important Message (Signed)
Important Message  Patient Details  Name: Helane RimaJoann Y Nesheim MRN: [id number] Date of Birth: 04-19-1934   Medicare Important Message Given:  Yes    Chapman FitchBOWEN, Vinessa Macconnell T, RN 08/14/2016, 3:47 PM

## 2016-08-14 NOTE — Care Management (Signed)
Dimas ChyleAmanda Morris HD liaison notified discharge order

## 2016-08-14 NOTE — Progress Notes (Signed)
Antibiotic orders of vancomycin and ceftazidime called in to the dialysis Center to nurse [facility].

## 2016-08-14 NOTE — Progress Notes (Signed)
[facility] INFECTIOUS DISEASE PROGRESS NOTE Date of Admission:  08/09/2016     ID: [name] is a 83 y.o. female with  L spine discitis Principal Problem:   Discitis Active Problems:   End stage renal disease on dialysis ([place])   Diabetes mellitus ([place])   Chronic atrial fibrillation (HCC)   Heel ulcer (HCC)   Subjective: No fevers, had aspiration today   ROS  Eleven systems are reviewed and negative except per hpi  Medications:  Antibiotics Given (last 72 hours)    None     . calcium acetate  1,334 mg Oral TID WC  . diltiazem  120 mg Oral BH-q7a  . epoetin (EPOGEN/PROCRIT) injection  4,000 Units Intravenous Q M,W,F-HD  . famotidine  20 mg Oral Daily  . fentaNYL      . gabapentin  100 mg Oral Q T,Th,S,Su  . gabapentin  300 mg Oral Q M,W,F-2000  . insulin aspart  0-5 Units Subcutaneous QHS  . insulin aspart  0-9 Units Subcutaneous TID WC  . midazolam      . pravastatin  40 mg Oral QPM    Objective: Vital signs in last 24 hours: Temp:  [98.2 F (36.8 C)] 98.2 F (36.8 C) (03/16 1141) Pulse Rate:  [65-118] 97 (03/16 1300) Resp:  [12-30] 24 (03/16 1300) BP: (105-145)/(52-93) 136/61 (03/16 1300) SpO2:  [92 %-100 %] 100 % (03/16 1250) Weight:  [70.9 kg (156 lb 4.9 oz)] 70.9 kg (156 lb 4.9 oz) (03/16 1141) Constitutional:  oriented to person, place, and time. appears well-developed and well-nourished. No distress.  HENT: [place]/AT, PERRLA, no scleral icterus Mouth/Throat: Oropharynx is clear and dry . No oropharyngeal exudate.  Cardiovascular: Normal rate, regular rhythm and normal heart sounds. Pulmonary/Chest: Effort normal and breath sounds normal. No respiratory distress.  has no wheezes.  Neck = supple, no nuchal rigidity Abdominal: Soft. Bowel sounds are normal.  exhibits no distension. There is no tenderness.  Lymphadenopathy: no cervical adenopathy. No axillary adenopathy Neurological: alert and oriented to person, place, and time.  Ext R UE avf wnl Skin:  Skin is warm and dry. R heel with eschar over achiles  Psychiatric: a normal mood and affect.  behavior is normal.   Lab Results  Recent Labs  08/12/16 0509 08/13/16 0525  WBC  --  8.7  HGB  --  10.3*  HCT  --  30.5*  NA 133*  --   K 4.7  --   CL 93*  --   CO2 28  --   BUN 48*  --   CREATININE 4.57*  --     Microbiology: Results for orders placed or performed during the hospital encounter of 08/09/16  Urine culture     Status: Abnormal   Collection Time: 08/09/16 10:04 PM  Result Value Ref Range Status   Specimen Description URINE, RANDOM  Final   Special Requests NONE  Final   Culture (A)  Final    >=100,000 COLONIES/mL GROUP B STREP(S.AGALACTIAE)ISOLATED TESTING AGAINST S. AGALACTIAE NOT ROUTINELY PERFORMED DUE TO PREDICTABILITY OF AMP/PEN/VAN SUSCEPTIBILITY. Performed at [facility], [place] [address], [place], [place] [address]    Report Status 08/11/2016 FINAL  Final  Blood culture (routine x 2)     Status: None (Preliminary result)   Collection Time: 08/09/16 11:54 PM  Result Value Ref Range Status   Specimen Description BLOOD LEFT UPPER ARM  Final   Special Requests   Final    BOTTLES DRAWN AEROBIC AND  ANAEROBIC BCHV HIGH VOLUME   Culture NO GROWTH 4 DAYS  Final   Report Status PENDING  Incomplete  Blood culture (routine x 2)     Status: None (Preliminary result)   Collection Time: 08/09/16 11:59 PM  Result Value Ref Range Status   Specimen Description BLOOD LEFT FOREARM  Final   Special Requests   Final    BOTTLES DRAWN AEROBIC AND ANAEROBIC BCAV ADEQUATE VOLUME   Culture NO GROWTH 4 DAYS  Final   Report Status PENDING  Incomplete  MRSA PCR Screening     Status: None   Collection Time: 08/10/16  2:29 AM  Result Value Ref Range Status   MRSA by PCR NEGATIVE NEGATIVE Final    Comment:        The GeneXpert MRSA Assay (FDA approved for NASAL specimens only), is one component of a comprehensive MRSA colonization surveillance program. It is  not intended to diagnose MRSA infection nor to guide or monitor treatment for MRSA infections.     Studies/Results: Ir Fluoro Guide Ndl Plmt / Bx  Result Date: 08/14/2016 INDICATION: Back pain.  CT suggests discitis/osteomyelitis L2-3. EXAM: LUMBAR DISC ASPIRATION UNDER FLUOROSCOPY MEDICATIONS: Lidocaine 1% subcutaneous ANESTHESIA/SEDATION: Intravenous Fentanyl and Versed were administered as conscious sedation during continuous monitoring of the patient's level of consciousness and physiological / cardiorespiratory status by the radiology RN, with a total moderate sedation time of 6 minutes. PROCEDURE: Informed written consent was obtained from the patient after a thorough discussion of the procedural risks, benefits and alternatives. All questions were addressed. Maximal Sterile Barrier Technique was utilized including caps, mask, sterile gowns, sterile gloves, sterile drape, hand hygiene and skin antiseptic. A timeout was performed prior to the initiation of the procedure. Patient placed prone. Lumbar region prepped with chlorhexidine, draped in usual sterile fashion. The L2-3 interspace was localized under fluoroscopy and an appropriate skin entry site was determined and marked under fluoroscopic guidance, a 17 gauge trocar needle was advanced into the L2-3 interspace from a right posterolateral approach. Needle tip position confirmed within the interspace on AP and lateral fluoroscopy. Approximately 2 mL of thin bloody fluid were aspirated, sent for the requested laboratory studies. The patient tolerated the procedure well. FLUOROSCOPY TIME:  1.2 minutes (27 mGy) COMPLICATIONS: None immediate. IMPRESSION: 1. Technically successful aspiration of 2 mL thin bloody fluid from the L2-3 interspace under fluoroscopy, sent for the requested laboratory studies. Electronically Signed   By: Lucrezia Europe M.D.   On: 08/14/2016 12:24   Dg Chest Port 1 View  Result Date: 08/13/2016 CLINICAL DATA:  Productive  cough for the past 2-3 days EXAM: PORTABLE CHEST 1 VIEW COMPARISON:  PA and lateral chest x-ray of August 13, 2015 FINDINGS: The lungs are well-expanded. The interstitial markings are increased bilaterally and more conspicuous than on the previous study. Confluent densities inferiorly on the left have improved. The cardiac silhouette remains enlarged. The central pulmonary vascularity remains engorged. There is calcification in the wall of the aortic arch. The trachea is midline. There is degenerative change of both shoulders. IMPRESSION: COPD. Chronic interstitial prominence with possible superimposed acute bronchitis or interstitial pneumonia. Low-grade CHF, stable. Thoracic aortic atherosclerosis. Electronically Signed   By: Vinetta Brach  Martinique M.D.   On: 08/13/2016 11:31    Assessment/Plan: MALEAH RABAGO is a 83 y.o. female admitted with back pain and found on CT imaging to have possible L2-3 discitis and surrounding phlegmon. SHe is ESRD and is on HD but has not had any issues with HD  access infection now recent bacteremias. She does have a chronic heel ulcer but this appears relatively dry and not actively infection. She had prior CT abd 2017 with some sclerotic changes noted.  Apparently MRI cannot be done locally due to a spinal device she has in place.  She has had aspiration 3/16 with cultures pending ESR 86.  CRP 7.8  Recommendations  Can start ceftazidime and vancomycin for a 6 week course I will review cultures and see her in 2 weeks to follow up on cultures and labs  Thank you very much for the consult. Will follow with you.  Kilie Rund P   08/14/2016, 1:29 PM

## 2016-08-14 NOTE — Progress Notes (Signed)
ANTICOAGULATION CONSULT NOTE - Initial Consult  Pharmacy Consult for warfarin  Indication: atrial fibrillation  Allergies  Allergen Reactions  . Codeine Itching and Other (See Comments)    Reaction:  Confusion/Hallucinations   . Contrast Media [Iodinated Diagnostic Agents] Other (See Comments)    "skin peel" breaks into a blistered rash, then peel. If given Benadryl and solumedrol she will be okay  . Oxycodone Other (See Comments)    Reaction:  Confusion/Hallucinations   . Quinine Derivatives Rash  . Tape Other (See Comments)    Skin is very thin and will blister and pull off skin with any tape except paper tape.  PLEASE USE PAPER TAPE.    Patient Measurements: Height: 5\' 5"  (165.1 cm) Weight: 156 lb 4.9 oz (70.9 kg) IBW/kg (Calculated) : 57  Vital Signs: Temp: 97.5 F (36.4 C) (03/16 1450) Temp Source: Oral (03/16 1450) BP: 139/77 (03/16 1450) Pulse Rate: 109 (03/16 1450)  Labs:  Recent Labs  08/12/16 0509 08/13/16 0525 08/14/16 0516  HGB  --  10.3*  --   HCT  --  30.5*  --   PLT  --  239  --   LABPROT 19.4* 17.8* 15.8*  INR 1.62 1.45 1.25  CREATININE 4.57*  --   --     Estimated Creatinine Clearance: 9.7 mL/min (A) (by C-G formula based on SCr of 4.57 mg/dL (H)).   Medical History: Past Medical History:  Diagnosis Date  . Anemia   . Anxiety   . Arthritis    gout  . Atrial fibrillation (HCC)   . CHF (congestive heart failure) (HCC)    has had this off and on prior to dialysis but has had several times since  . Chronic kidney disease    end stage renal insufficiency.  on dialysis M,W,F  . Diabetes mellitus without complication (HCC)   . Dialysis patient [facility](HCC)    Mon.- Wed- Fri.  . Dysrhythmia    afib/flutter  . GERD (gastroesophageal reflux disease)   . Hypertension   . 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 Peripheral vascular disease (HCC)   . Pleural effusion   . Pulmonary hypertension   . Renal insufficiency   . Restless leg  syndrome   . Shortness of breath dyspnea    with exertion  . Stroke (HCC) 06/2015   has had 1 other stroke. first one affected vision. the most recent one affected right arm    Assessment: 83 yo female with past medical history of  A. Fib. Pharmacy consulted for warfarin dosing.   Home Regimen: Warfarin 4mg  Tues, Thurs                             Warfarin 2mg  all other days   DATE   INR DOSE 3/16  1.25 4mg   Goal of Therapy:  INR 2-3 Monitor platelets by anticoagulation protocol: Yes   Plan:  Will gave patient warfarin 4mg  x 1 dose. Recommend starting patients home regimen of 2mg  tomorrow. Will ordered INRs daily until therapeutic.   Gardner CandleSheema M Yoshiye Kraft, PharmD, BCPS Clinical Pharmacist 08/14/2016 5:20 PM

## 2016-08-14 NOTE — Progress Notes (Signed)
PHARMACY CONSULT NOTE FOR:  OUTPATIENT  PARENTERAL ANTIBIOTIC THERAPY (OPAT)  Indication: osteomyelitis Regimen: Vancomycin 1g IV 3 x a week after each dialysis session for 6 weeks. Ceftazidime 2g IV 3 x a week after each dialysis session for 6 weeks End date: 09/25/16  IV antibiotic discharge orders are pended. To discharging provider:  please sign these orders via discharge navigator,  Select New Orders & click on the button choice - Manage This Unsigned Work.     Thank you for allowing pharmacy to be a part of this patient's care.  Clovia CuffLisa Kylar Speelman, PharmD, BCPS 08/14/2016 1:56 PM

## 2016-08-14 NOTE — Procedures (Signed)
FLuoro guided aspiration L2-3 disc 2ml bloody fluid No complication No blood loss. [facility].

## 2016-08-14 NOTE — Progress Notes (Signed)
POST TREATMENT ASSESSMENT 

## 2016-08-14 NOTE — Progress Notes (Signed)
Pre hd assessment  

## 2016-08-15 LAB — CULTURE, BLOOD (ROUTINE X 2)
CULTURE: NO GROWTH
Culture: NO GROWTH
Special Requests: ADEQUATE

## 2016-08-15 LAB — CBC
HEMATOCRIT: 31.4 % — AB (ref 35.0–47.0)
Hemoglobin: 10.6 g/dL — ABNORMAL LOW (ref 12.0–16.0)
MCH: 33.2 pg (ref 26.0–34.0)
MCHC: 33.7 g/dL (ref 32.0–36.0)
MCV: 98.3 fL (ref 80.0–100.0)
PLATELETS: 203 10*3/uL (ref 150–440)
RBC: 3.19 MIL/uL — ABNORMAL LOW (ref 3.80–5.20)
RDW: 14.9 % — AB (ref 11.5–14.5)
WBC: 8.7 10*3/uL (ref 3.6–11.0)

## 2016-08-15 LAB — GLUCOSE, CAPILLARY
GLUCOSE-CAPILLARY: 114 mg/dL — AB (ref 65–99)
GLUCOSE-CAPILLARY: 164 mg/dL — AB (ref 65–99)
GLUCOSE-CAPILLARY: 146 mg/dL — AB (ref 65–99)
Glucose-Capillary: 179 mg/dL — ABNORMAL HIGH (ref 65–99)

## 2016-08-15 LAB — PROTIME-INR
INR: 1.33
PROTHROMBIN TIME: 16.6 s — AB (ref 11.4–15.2)

## 2016-08-15 MED ORDER — WARFARIN SODIUM 2 MG PO TABS
2.0000 mg | ORAL_TABLET | Freq: Every day | ORAL | Status: DC
  Administered 2016-08-15: 2 mg via ORAL
  Filled 2016-08-15: qty 1

## 2016-08-15 NOTE — Progress Notes (Signed)
[facility]  ROUNDING NOTE   Subjective:  Patient due for hemodialysis again on Monday. Lumbar spine fluid collection was aspirated yesterday. Patient resting comfortably at the moment.   Objective:  Vital signs in last 24 hours:  Temp:  [97.5 F (36.4 C)-99.1 F (37.3 C)] 97.5 F (36.4 C) (03/17 1255) Pulse Rate:  [85-127] 85 (03/17 1255) Resp:  [20] 20 (03/17 0632) BP: (117-149)/(58-72) 117/58 (03/17 1255) SpO2:  [98 %-100 %] 98 % (03/17 1255)  Weight change:  Filed Weights   08/12/16 1507 08/12/16 1807 08/14/16 1141  Weight: 71.5 kg (157 lb 10.1 oz) 69.8 kg (153 lb 14.1 oz) 70.9 kg (156 lb 4.9 oz)    Intake/Output: I/O last 3 completed shifts: In: 70 [I.V.:70] Out: 1500 [Other:1500]   Intake/Output this shift:  No intake/output data recorded.  Physical Exam: General: No acute distress  Head: Normocephalic, atraumatic. Moist oral mucosal membranes  Eyes: Anicteric  Neck: Supple, trachea midline  Lungs:  Clear to auscultation, normal effort  Heart: S1S2 no rubs  Abdomen:  Soft, nontender, bowel sounds present  Extremities: 1+ peripheral edema  Neurologic: Nonfocal, moving all four extremities  Skin: No lesions  Access: RUE AVF    Basic Metabolic Panel:  Recent Labs Lab 08/09/16 2011 08/10/16 0327 08/10/16 1040 08/11/16 0401 08/12/16 0509 08/14/16 0516  NA 132* 127*  --  134* 133*  --   K 6.1* 5.6*  --  4.0 4.7  --   CL 93* 91*  --  94* 93*  --   CO2 24 22  --  30 28  --   GLUCOSE 127* 110*  --  111* 105*  --   BUN 65* 65*  --  28* 48*  --   CREATININE 5.46* 5.37*  --  3.20* 4.57*  --   CALCIUM 8.7* 8.9  --  8.5* 8.8*  --   PHOS  --   --  5.8*  --   --  3.9    Liver Function Tests:  Recent Labs Lab 08/09/16 2011  AST 18  ALT 12*  ALKPHOS 69  BILITOT 0.6  PROT 7.4  ALBUMIN 3.2*   No results for input(s): LIPASE, AMYLASE in the last 168 hours. No results for input(s): AMMONIA in the last 168 hours.  CBC:  Recent  Labs Lab 08/09/16 2011 08/10/16 0327 08/13/16 0525 08/15/16 0403  WBC 7.5 8.0 8.7 8.7  NEUTROABS 5.7  --   --   --   HGB 10.1* 10.1* 10.3* 10.6*  HCT 29.0* 29.1* 30.5* 31.4*  MCV 96.7 97.2 96.3 98.3  PLT 251 252 239 203    Cardiac Enzymes: No results for input(s): CKTOTAL, CKMB, CKMBINDEX, TROPONINI in the last 168 hours.  BNP: Invalid input(s): POCBNP  CBG:  Recent Labs Lab 08/14/16 0753 08/14/16 1638 08/14/16 2145 08/15/16 0736 08/15/16 1143  GLUCAP 131* 105* 155* 114* 164*    Microbiology: Results for orders placed or performed during the hospital encounter of 08/09/16  Urine culture     Status: Abnormal   Collection Time: 08/09/16 10:04 PM  Result Value Ref Range Status   Specimen Description URINE, RANDOM  Final   Special Requests NONE  Final   Culture (A)  Final    >=100,000 COLONIES/mL GROUP B STREP(S.AGALACTIAE)ISOLATED TESTING AGAINST S. AGALACTIAE NOT ROUTINELY PERFORMED DUE TO PREDICTABILITY OF AMP/PEN/VAN SUSCEPTIBILITY. Performed at [facility], [address] [place], [place] [address]    Report Status 08/11/2016 FINAL  Final  Blood  culture (routine x 2)     Status: None   Collection Time: 08/09/16 11:54 PM  Result Value Ref Range Status   Specimen Description BLOOD LEFT UPPER ARM  Final   Special Requests   Final    BOTTLES DRAWN AEROBIC AND ANAEROBIC BCHV HIGH VOLUME   Culture NO GROWTH 5 DAYS  Final   Report Status 08/15/2016 FINAL  Final  Blood culture (routine x 2)     Status: None   Collection Time: 08/09/16 11:59 PM  Result Value Ref Range Status   Specimen Description BLOOD LEFT FOREARM  Final   Special Requests   Final    BOTTLES DRAWN AEROBIC AND ANAEROBIC BCAV ADEQUATE VOLUME   Culture NO GROWTH 5 DAYS  Final   Report Status 08/15/2016 FINAL  Final  MRSA PCR Screening     Status: None   Collection Time: 08/10/16  2:29 AM  Result Value Ref Range Status   MRSA by PCR NEGATIVE NEGATIVE Final    Comment:        The  GeneXpert MRSA Assay (FDA approved for NASAL specimens only), is one component of a comprehensive MRSA colonization surveillance program. It is not intended to diagnose MRSA infection nor to guide or monitor treatment for MRSA infections.   Culture, fungus without smear     Status: None (Preliminary result)   Collection Time: 08/14/16 11:15 AM  Result Value Ref Range Status   Specimen Description ABSCESS  Final   Special Requests Normal  Final   Culture   Final    NO FUNGUS ISOLATED AFTER 1 DAY Performed at [facility], [address], [place], [place] [address]    Report Status PENDING  Incomplete  Aerobic/Anaerobic Culture (surgical/deep wound)     Status: None (Preliminary result)   Collection Time: 08/14/16 11:15 AM  Result Value Ref Range Status   Specimen Description ABSCESS L2 L3  Final   Special Requests Normal  Final   Gram Stain   Final    ABUNDANT WBC PRESENT, PREDOMINANTLY PMN RARE GRAM POSITIVE COCCI IN PAIRS    Culture   Final    NO GROWTH 1 DAY Performed at [facility], [address] [place], [place] [address]    Report Status PENDING  Incomplete    Coagulation Studies:  Recent Labs  08/13/16 0525 08/14/16 0516 08/15/16 0403  LABPROT 17.8* 15.8* 16.6*  INR 1.45 1.25 1.33    Urinalysis: No results for input(s): COLORURINE, LABSPEC, PHURINE, GLUCOSEU, HGBUR, BILIRUBINUR, KETONESUR, PROTEINUR, UROBILINOGEN, NITRITE, LEUKOCYTESUR in the last 72 hours.  Invalid input(s): APPERANCEUR    Imaging: Ir Fluoro Guide Ndl Plmt / Bx  Result Date: 08/14/2016 INDICATION: Back pain.  CT suggests discitis/osteomyelitis L2-3. EXAM: LUMBAR DISC ASPIRATION UNDER FLUOROSCOPY MEDICATIONS: Lidocaine 1% subcutaneous ANESTHESIA/SEDATION: Intravenous Fentanyl and Versed were administered as conscious sedation during continuous monitoring of the patient's level of consciousness and physiological / cardiorespiratory status by the radiology RN, with a total  moderate sedation time of 6 minutes. PROCEDURE: Informed written consent was obtained from the patient after a thorough discussion of the procedural risks, benefits and alternatives. All questions were addressed. Maximal Sterile Barrier Technique was utilized including caps, mask, sterile gowns, sterile gloves, sterile drape, hand hygiene and skin antiseptic. A timeout was performed prior to the initiation of the procedure. Patient placed prone. Lumbar region prepped with chlorhexidine, draped in usual sterile fashion. The L2-3 interspace was localized under fluoroscopy and an appropriate skin entry site was determined  and marked under fluoroscopic guidance, a 17 gauge trocar needle was advanced into the L2-3 interspace from a right posterolateral approach. Needle tip position confirmed within the interspace on AP and lateral fluoroscopy. Approximately 2 mL of thin bloody fluid were aspirated, sent for the requested laboratory studies. The patient tolerated the procedure well. FLUOROSCOPY TIME:  1.2 minutes (27 mGy) COMPLICATIONS: None immediate. IMPRESSION: 1. Technically successful aspiration of 2 mL thin bloody fluid from the L2-3 interspace under fluoroscopy, sent for the requested laboratory studies. Electronically Signed   By: Corlis Leak  Hassell M.D.   On: 08/14/2016 12:24     Medications:   . sodium chloride 10 mL/hr at 08/14/16 1000   . calcium acetate  1,334 mg Oral TID WC  . [START ON 08/17/2016] cefTAZidime (FORTAZ)  IV  2 g Intravenous Q M,W,F-HD  . diltiazem  120 mg Oral BH-q7a  . epoetin (EPOGEN/PROCRIT) injection  4,000 Units Intravenous Q M,W,F-HD  . famotidine  20 mg Oral Daily  . gabapentin  100 mg Oral Q T,Th,S,Su  . gabapentin  300 mg Oral Q M,W,F-2000  . insulin aspart  0-5 Units Subcutaneous QHS  . insulin aspart  0-9 Units Subcutaneous TID WC  . pravastatin  40 mg Oral QPM  . vancomycin  1,500 mg Intravenous Once  . [START ON 08/17/2016] vancomycin  1,000 mg Intravenous Q M,W,F-HD  .  warfarin  2 mg Oral q1800  . Warfarin - Pharmacist Dosing Inpatient   Does not apply q1800   acetaminophen **OR** acetaminophen, albuterol, ondansetron **OR** ondansetron (ZOFRAN) IV, traMADol  Assessment/ Plan:  83 y.o. female with atrial fibrillation on Coumadin, hypertension, gout, insulin-dependent diabetes mellitus type 2, diabetic neuropathy, CVA with right visual field blindness, hyperlipidemia, major depressive disorder, overactive bladder with incontinence, generalized anxiety disorder, congestive heart failure diastolic, parathyroidectomy, and osteoarthritis  MWF [facility] [address]   1. End Stage Renal Disease:  - Patient completed dialysis yesterday. No acute indication for dialysis today. We will plan for dialysis again on Monday.  2. Discitis. Patient appears to have phlegmon between L2 and L3.  Aspiration of this fluid collected and cultures are still pending.  [facility]   3. Hypertension:   blood pressure currently 117/58.  Continue diltiazem.  4. Secondary Hyperparathyroidism: Phosphorus 3.9 at last check. Continue calcium acetate.  5. Anemia of chronic kidney disease:  Hemoglobin at target at 10.6. Continue Epogen with dialysis.  LOS: 5 Laquincy Eastridge 3/17/20184:05 PM

## 2016-08-15 NOTE — NC FL2 (Signed)
[name] MEDICAID FL2 LEVEL OF CARE SCREENING TOOL     IDENTIFICATION  Patient Name: [name] Birthdate: 06/05/1933 Sex: female Admission Date (Current Location): 08/09/2016  [name] and [name] Number:  [name] and Address:  [name], [address] [name], [name]      Provider Number: [id number]  Attending Physician Name and Address:  Katharina Caper, [name]  Relative Name and Phone Number:       Current Level of Care: Hospital Recommended Level of Care: Skilled Nursing Facility Prior Approval Number:    Date Approved/Denied:   PASRR Number:  ([id number] )  Discharge Plan: SNF    Current Diagnoses: Patient Active Problem List   Diagnosis Date Noted  . Heel ulcer (HCC) 08/10/2016  . Discitis 08/09/2016  . Atherosclerosis of native arteries of the extremities with ulceration (HCC) 04/14/2016  . Kidney dialysis as the cause of abnormal reaction of the patient, or of later complication, without mention of misadventure at the time of the procedure (CODE) 03/03/2016  . Pressure injury of skin 02/18/2016  . Osteomyelitis (HCC) 02/14/2016  . Hematoma 10/12/2015  . Leg pain, right 09/14/2015  . PVD (peripheral vascular disease) with claudication (HCC) 09/14/2015  . Pneumonia 08/13/2015  . Carotid stenosis 07/26/2015  . Amputation of fifth toe, left, traumatic (HCC) 06/24/2015  . Diabetes mellitus (HCC) 06/23/2015  . Diabetic neuropathy (HCC) 06/23/2015  . Chronic atrial fibrillation (HCC) 06/23/2015  . Left carotid artery stenosis 06/23/2015  . Cerebral infarction (HCC) 06/21/2015  . Pressure ulcer 06/19/2015  . End stage renal disease on dialysis (HCC) 06/18/2015  . Respiratory distress 12/11/2014    Orientation RESPIRATION BLADDER Height & Weight     Self, Time, Situation, Place  O2 (2 Liters Oxygen ) Incontinent Weight: 156 lb 4.9 oz (70.9 kg) Height:  5\' 5"  (165.1 cm)  BEHAVIORAL SYMPTOMS/MOOD  NEUROLOGICAL BOWEL NUTRITION STATUS   (none)  (none) Incontinent Diet (Diet: Renal/ Carb Modified )  AMBULATORY STATUS COMMUNICATION OF NEEDS Skin   Extensive Assist Verbally PU Stage and Appropriate Care (Unstagable pressure ulcer on left heel. )                       Personal Care Assistance Level of Assistance  Bathing, Feeding, Dressing Bathing Assistance: Limited assistance Feeding assistance: Independent Dressing Assistance: Limited assistance     Functional Limitations Info  Sight, Hearing, Speech Sight Info: Adequate Hearing Info: Adequate Speech Info: Adequate    SPECIAL CARE FACTORS FREQUENCY  PT (By licensed PT), OT (By licensed OT)     PT Frequency:  (5) OT Frequency:  (5)            Contractures      Additional Factors Info  Code Status, Allergies, Insulin Sliding Scale, Isolation Precautions Code Status Info:  (DNR ) Allergies Info:  (Codeine, Contrast Media Iodinated Diagnostic Agents, Oxycodone, Quinine Derivatives, Tape)   Insulin Sliding Scale Info:  (NovoLog Insulin Injections. ) Isolation Precautions Info:  (History of VRE. Dialysis, MWF at [facility] on [address] )     Current Medications (08/15/2016):  This is the current hospital active medication list Current Facility-Administered Medications  Medication Dose Route Frequency Provider Last Rate Last Dose  . 0.9 %  sodium chloride infusion   Intravenous Continuous Natalie Sell, [name] 10 mL/hr at 08/14/16 1000    . acetaminophen (TYLENOL) tablet 650 mg  650 mg Oral Q6H PRN Natalie Manis, [name]  Or  . acetaminophen (TYLENOL) suppository 650 mg  650 mg Rectal Q6H PRN Natalie Manisavid Willis, [name]      . albuterol (PROVENTIL) (2.5 MG/3ML) 0.083% nebulizer solution 3 mL  3 mL Inhalation Q6H PRN Natalie Manisavid Willis, [name]      . calcium acetate (PHOSLO) capsule 1,334 mg  1,334 mg Oral TID WC Natalie Manisavid Willis, [name]   1,334 mg at 08/15/16 1222  . [START ON 08/17/2016] cefTAZidime (FORTAZ) 2 g in dextrose 5 % 50 mL IVPB  2 g  Intravenous Q M,W,F-HD Natalie Gouru, [name]      . diltiazem (CARDIZEM CD) 24 hr capsule 120 mg  120 mg Oral Natalie FabianBH-q7a Natalie Willis, [name]   120 mg at 08/15/16 0844  . epoetin alfa (EPOGEN,PROCRIT) injection 4,000 Units  4,000 Units Intravenous Q M,W,F-HD [name]   4,000 Units at 08/14/16 1435  . famotidine (PEPCID) tablet 20 mg  20 mg Oral Daily Natalie Manisavid Willis, [name]   20 mg at 08/15/16 1032  . gabapentin (NEURONTIN) capsule 100 mg  100 mg Oral Q T,Th,S,Su Natalie Manisavid Willis, [name]   100 mg at 08/15/16 1032  . gabapentin (NEURONTIN) capsule 300 mg  300 mg Oral Q M,W,F-2000 Natalie Manisavid Willis, [name]   300 mg at 08/14/16 1953  . insulin aspart (novoLOG) injection 0-5 Units  0-5 Units Subcutaneous QHS Natalie Manisavid Willis, [name]      . insulin aspart (novoLOG) injection 0-9 Units  0-9 Units Subcutaneous TID WC Natalie Manisavid Willis, [name]   2 Units at 08/15/16 1223  . ondansetron (ZOFRAN) tablet 4 mg  4 mg Oral Q6H PRN Natalie Manisavid Willis, [name]       Or  . ondansetron [facility](ZOFRAN) injection 4 mg  4 mg Intravenous Q6H PRN Natalie Manisavid Willis, [name]      . pravastatin (PRAVACHOL) tablet 40 mg  40 mg Oral QPM Natalie Manisavid Willis, [name]   40 mg at 08/14/16 1806  . traMADol (ULTRAM) tablet 50 mg  50 mg Oral Q6H PRN Natalie Manisavid Willis, [name]   50 mg at 08/15/16 1032  . vancomycin (VANCOCIN) 1,500 mg in sodium chloride 0.9 % 250 mL IVPB  1,500 mg Intravenous Once [name]      . Melene Muller[START ON 08/17/2016] vancomycin (VANCOCIN) IVPB 1000 mg/200 mL premix  1,000 mg Intravenous Q M,W,F-HD Natalie Gouru, [name]      . warfarin (COUMADIN) tablet 2 mg  2 mg Oral q1800 [name]      . Warfarin - Pharmacist Dosing Inpatient   Does not apply q1800 [name], [facility]         Discharge Medications: Please see discharge summary for a list of discharge medications.  Relevant Imaging Results:  Relevant Lab Results:   Additional Information  (SSN: [id number])  [facility] on [address]   [name], [place] M, [place]

## 2016-08-15 NOTE — Progress Notes (Signed)
[facility] [place] [facility]   PATIENT NAME: [name]    MR#:  [id number]  DATE OF BIRTH:  January 31, 1934  SUBJECTIVE:  CHIEF COMPLAINT:   Chief Complaint  Patient presents with  . Abdominal Pain    Lower groin and lower abdomen   - Patient had aspiration of the fluid from her lumbar spine -March 16th 2018. Patient tolerated procedure well-  MRI cannot be done due to back stimulator - no fevers, on ceftazidime and vancomycin. Complains of significant low back pain , has difficulty moving in the bed . The patient was not yet seen by physical therapist, although skilled nursing facility placement for rehabilitation is being planned and recommended due to lower abdominal pain and inability to move .  REVIEW OF SYSTEMS:  Review of Systems  Constitutional: Positive for malaise/fatigue. Negative for chills and fever.  HENT: Negative for congestion, ear discharge, hearing loss and nosebleeds.   Eyes: Negative for blurred vision.  Respiratory: Negative for cough, shortness of breath and wheezing.   Cardiovascular: Negative for chest pain, palpitations and leg swelling.  Gastrointestinal: Negative for abdominal pain, constipation, diarrhea, nausea and vomiting.  Genitourinary: Negative for dysuria.  Musculoskeletal: Positive for back pain.  Neurological: Negative for dizziness, seizures and headaches.    DRUG ALLERGIES:   Allergies  Allergen Reactions  . Codeine Itching and Other (See Comments)    Reaction:  Confusion/Hallucinations   . Contrast Media [Iodinated Diagnostic Agents] Other (See Comments)    "skin peel" breaks into a blistered rash, then peel. If given Benadryl and solumedrol she will be okay  . Oxycodone Other (See Comments)    Reaction:  Confusion/Hallucinations   . Quinine Derivatives Rash  . Tape Other (See Comments)    Skin is very thin and will blister and pull off skin with any tape except paper tape.  PLEASE USE PAPER TAPE.     VITALS:  Blood pressure (!) 117/58, pulse 85, temperature 97.5 F (36.4 C), temperature source Oral, resp. rate 20, height 5\' 5"  (1.651 m), weight 70.9 kg (156 lb 4.9 oz), SpO2 98 %.  PHYSICAL EXAMINATION:  Physical Exam  GENERAL:  83 y.o.-year-old patient lying in the bedIn moderate distress due to lower back pain, unable to move her from side to side or even lift up her legs and position herself more comfortably in bed EYES: Pupils equal, round, reactive to light and accommodation. No scleral icterus. Extraocular muscles intact.  HEENT: Head atraumatic, normocephalic. Oropharynx and nasopharynx clear.  NECK:  Supple, no jugular venous distention. No thyroid enlargement, no tenderness.  LUNGS: Normal breath sounds bilaterally, no wheezing, rales,rhonchi or crepitation. No use of accessory muscles of respiration.  CARDIOVASCULAR S1. S2, Irregularity irregular No  rubs, or gallops. 3/6 systolic murmur present ABDOMEN: Soft, nontender, nondistended. Bowel sounds present. No organomegaly or mass.  EXTREMITIES: No pedal edema, cyanosis, or clubbing. Right leg dressing in place significant discomfort in lumbar spine. Palpation  NEUROLOGIC: Cranial nerves II through XII are intact. Muscle strength 5/5 in all extremities. Sensation intact. Gait not checked.  PSYCHIATRIC: The patient is alert and oriented x 3.  SKIN: No obvious rash, lesion, or ulcer.    LABORATORY PANEL:   CBC  Recent Labs Lab 08/15/16 0403  WBC 8.7  HGB 10.6*  HCT 31.4*  PLT 203   ------------------------------------------------------------------------------------------------------------------  Chemistries   Recent Labs Lab 08/09/16 2011  08/12/16 0509  NA 132*  < > 133*  K 6.1*  < >  4.7  CL 93*  < > 93*  CO2 24  < > 28  GLUCOSE 127*  < > 105*  BUN 65*  < > 48*  CREATININE 5.46*  < > 4.57*  CALCIUM 8.7*  < > 8.8*  AST 18  --   --   ALT 12*  --   --   ALKPHOS 69  --   --   BILITOT 0.6  --   --   < >  = values in this interval not displayed. ------------------------------------------------------------------------------------------------------------------  Cardiac Enzymes No results for input(s): TROPONINI in the last 168 hours. ------------------------------------------------------------------------------------------------------------------  RADIOLOGY:  Ir Fluoro Guide Ndl Plmt / Bx  Result Date: 08/14/2016 INDICATION: Back pain.  CT suggests discitis/osteomyelitis L2-3. EXAM: LUMBAR DISC ASPIRATION UNDER FLUOROSCOPY MEDICATIONS: Lidocaine 1% subcutaneous ANESTHESIA/SEDATION: Intravenous Fentanyl and Versed were administered as conscious sedation during continuous monitoring of the patient's level of consciousness and physiological / cardiorespiratory status by the radiology RN, with a total moderate sedation time of 6 minutes. PROCEDURE: Informed written consent was obtained from the patient after a thorough discussion of the procedural risks, benefits and alternatives. All questions were addressed. Maximal Sterile Barrier Technique was utilized including caps, mask, sterile gowns, sterile gloves, sterile drape, hand hygiene and skin antiseptic. A timeout was performed prior to the initiation of the procedure. Patient placed prone. Lumbar region prepped with chlorhexidine, draped in usual sterile fashion. The L2-3 interspace was localized under fluoroscopy and an appropriate skin entry site was determined and marked under fluoroscopic guidance, a 17 gauge trocar needle was advanced into the L2-3 interspace from a right posterolateral approach. Needle tip position confirmed within the interspace on AP and lateral fluoroscopy. Approximately 2 mL of thin bloody fluid were aspirated, sent for the requested laboratory studies. The patient tolerated the procedure well. FLUOROSCOPY TIME:  1.2 minutes (27 mGy) COMPLICATIONS: None immediate. IMPRESSION: 1. Technically successful aspiration of 2 mL thin bloody  fluid from the L2-3 interspace under fluoroscopy, sent for the requested laboratory studies. Electronically Signed   By: Corlis Leak M.D.   On: 08/14/2016 12:24    EKG:   Orders placed or performed during the hospital encounter of 08/09/16  . EKG 12-Lead  . EKG 12-Lead    ASSESSMENT AND PLAN:   83 y/o F with PMH of low back pain chronic, ESRD on M-W-F hemodialysis, afib on coumadin, HTN, neuropathy, PVD admitted with abdominal pain and noted to have L2-L3 discitis  #1 . Lower Abdominal pain, due to  lumbar radiculopathy/Lumbar discitis -Status post aspiration of the fluid from the lumbar disc area today. Patient tolerated procedure well. Follow-up with cultures, Gram stain as recommended by infectious disease - could be referred pain from paraspinal muscles inflammation and L2-L3 discitis - CT abd with right non obstructing renal stone-patient denies any flank pain, dysuria - No back pain now. No fevers or elevated wbc- appreciate ID consult-  recommended to continue antibiotics fortaz and vanc during dialysis for the next 6 weeks through 09/22/2016 . Follow-up with infectious disease Dr. Sampson Goon and nephrology outpatient. Dr. Lourdes Sledge is aware -recommended MRI without contrast (dialysis patient) - but cannot be done due to a spinal stimulator - s/p aspiration from the phlegmon at the L2-L3 region by IR- coumadin held from March 13 and resumed Coumadin  08/14/2016, INR is 1.33, not therapeutic  - heating pad and conservative treatment until then, lumbar corset   #2 Right heel ulcer- wet-to-dry dressing changes -Follow up with wound care   #3  ESRD on HD- for dialysis as per nephrology  Hyponatremia and hyperkalemia resolved with dialysis. The body, etc. ordered for outpatient delivery, discussed with Dr. Cherylann Ratel   #4 Afib- rate controlled, on oral cardizem, - coumadin  resumed , INR is 1.33, follow closely   #5 Neuropathy- on gabapentin  #6 DVT Prophylaxis- hold  coumadin   #7. Low  back pain, due to discitis, continue supportive therapy, lidocaine, Aspercreme, heating pad, lumbar support, Physical Therapy consulted,  skilled nursing facility placement is recommended due to significant low back pains     All the records are reviewed and case discussed with Care Management/Social Workerr. Management plans discussed with the patient, family and they are in agreement.  CODE STATUS: DNR  TOTAL TIME TAKING CARE OF THIS PATIENT: 35 minutes.   Marland Kitchen Discharge planning was discussed with her patient's daughter,  all questions were answered, she voiced understanding  POSSIBLE D/C IN 2 DAYS, DEPENDING ON CLINICAL CONDITION.   Katharina Caper M.D on 08/15/2016 at 2:51 PM  Between 7am to 6pm - Pager - [phone]  After 6pm go to [email or web address] - Social research officer, government  [facility] [place] [facility]  Office  [phone]  CC: Primary care physician; [name], MD

## 2016-08-15 NOTE — Progress Notes (Signed)
ANTICOAGULATION CONSULT NOTE -  Pharmacy Consult for warfarin  Indication: atrial fibrillation  Allergies  Allergen Reactions  . Codeine Itching and Other (See Comments)    Reaction:  Confusion/Hallucinations   . Contrast Media [Iodinated Diagnostic Agents] Other (See Comments)    "skin peel" breaks into a blistered rash, then peel. If given Benadryl and solumedrol she will be okay  . Oxycodone Other (See Comments)    Reaction:  Confusion/Hallucinations   . Quinine Derivatives Rash  . Tape Other (See Comments)    Skin is very thin and will blister and pull off skin with any tape except paper tape.  PLEASE USE PAPER TAPE.    Patient Measurements: Height: 5\' 5"  (165.1 cm) Weight: 156 lb 4.9 oz (70.9 kg) IBW/kg (Calculated) : 57  Vital Signs: Temp: 98.2 F (36.8 C) (03/17 0632) Temp Source: Oral (03/17 78290632) BP: 135/69 (03/17 56210632) Pulse Rate: 98 (03/17 0632)  Labs:  Recent Labs  08/13/16 0525 08/14/16 0516 08/15/16 0403  HGB 10.3*  --  10.6*  HCT 30.5*  --  31.4*  PLT 239  --  203  LABPROT 17.8* 15.8* 16.6*  INR 1.45 1.25 1.33    Estimated Creatinine Clearance: 9.7 mL/min (A) (by C-G formula based on SCr of 4.57 mg/dL (H)).   Medical History: Past Medical History:  Diagnosis Date  . Anemia   . Anxiety   . Arthritis    gout  . Atrial fibrillation (HCC)   . CHF (congestive heart failure) (HCC)    has had this off and on prior to dialysis but has had several times since  . Chronic kidney disease    end stage renal insufficiency.  on dialysis M,W,F  . Diabetes mellitus without complication (HCC)   . Dialysis patient [facility](HCC)    Mon.- Wed- Fri.  . Dysrhythmia    afib/flutter  . GERD (gastroesophageal reflux disease)   . Hypertension   . 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 Peripheral vascular disease (HCC)   . Pleural effusion   . Pulmonary hypertension   . Renal insufficiency   . Restless leg syndrome   . Shortness of breath dyspnea    with exertion  . Stroke (HCC) 06/2015   has had 1 other stroke. first one affected vision. the most recent one affected right arm    Assessment: 83 yo female with past medical history of  A. Fib. Pharmacy consulted for warfarin dosing.   Home Regimen: Warfarin 4mg  Tues, Thurs                             Warfarin 2mg  all other days   DATE   INR DOSE 3/16  1.25 4 mg 3/17  1.33  Goal of Therapy:  INR 2-3 Monitor platelets by anticoagulation protocol: Yes   Plan:  Will start patients home regimen of Warfarin 2 mg daily. Will order INRs daily until therapeutic. Patient on Ceftazidime/Vancomycin  Angelique BlonderMerrill,Denyce Harr A, PharmD, BCPS Clinical Pharmacist 08/15/2016 8:15 AM

## 2016-08-15 NOTE — Clinical Social Work Note (Addendum)
Clinical Social Work Assessment  Patient Details  Name: [name] MRN: [id number] Date of Birth: [birth year]  Date of referral:  08/15/16               Reason for consult:  Facility Placement                Permission sought to share information with:  Chartered certified accountant granted to share information::  Yes, Verbal Permission Granted  Name::      [place]::   [place]   Relationship::     Contact Information:     Housing/Transportation Living arrangements for the past 2 months:  [place] of Information:    Patient Interpreter Needed:  None Criminal Activity/Legal Involvement Pertinent to Current Situation/Hospitalization:  No - Comment as needed Significant Relationships:  Adult Children Lives with:  Adult Children Do you feel safe going back to the place where you live?  Yes Need for family participation in patient care:  Yes (Comment)  Care giving concerns:  Patient lives in [place] with her daughter Pauline Aus and son in law.    Social Worker assessment / plan:  Holiday representative (CSW) received SNF consult. Per MD PT will be ordered and patient is very weak and will need SNF. Per Nephrology patient is set up to receive or IV Abx at her dialysis center [facility] [address] MWF at 10:40 am). CSW met with patient to discuss D/C plan. Patient was alert and oriented X4. CSW introduced self and explained role of CSW department. Patient reported that she lives in [place] with her daughter and son in Sports coach. Patient reported that she wants to go to SNF and prefers [facility]. Per patient she has been to [facility] before in December 2017. CSW explained that PT will have to evaluate patient to make sure she is appropriate for SNF and medicare requires a 3 night qualifying inpatient stay in order to pay for SNF. Patient was admitted to inpatient on 08/10/16. Patient verbalized her understanding  and is agreeable to SNF search in [place]. FL2 complete and faxed out. CSW will continue to follow and assist as needed.   Employment status:  Retired Forensic scientist:  Medicare PT Recommendations:  Not assessed at this time [place] / Referral to community resources:  [place]  Patient/Family's Response to care:  Patient requested to go to SNF at [facility] for short term rehab.    Patient/Family's Understanding of and Emotional Response to Diagnosis, Current Treatment, and Prognosis:  Patient was very pleasant and thanked CSW for visit.   Emotional Assessment Appearance:  Appears stated age Attitude/Demeanor/Rapport:    Affect (typically observed):  Accepting, Adaptable, Pleasant Orientation:  Oriented to Self, Oriented to Place, Oriented to  Time, Oriented to Situation Alcohol / Substance use:  Not Applicable Psych involvement (Current and /or in the community):  No (Comment)  Discharge Needs  Concerns to be addressed:  Discharge Planning Concerns Readmission within the last 30 days:  No Current discharge risk:  Dependent with Mobility Barriers to Discharge:  Continued Medical Work up   [employer], Veronia Beets, LCSW 08/15/2016, 3:29 PM

## 2016-08-15 NOTE — Clinical Social Work Placement (Signed)
   CLINICAL SOCIAL WORK PLACEMENT  NOTE  Date:  08/15/2016  Patient Details  Name: [name] MRN: [id number] Date of Birth: [birth year]  Clinical Social Work is seeking post-discharge placement for this patient at the Skilled  Nursing Facility level of care (*CSW will initial, date and re-position this form in  chart as items are completed):  Yes   Patient/family provided with [place] Clinical Social Work Department's list of facilities offering this level of care within the geographic area requested by the patient (or if unable, by the patient's family).  Yes   Patient/family informed of their freedom to choose among providers that offer the needed level of care, that participate in Medicare, Medicaid or managed care program needed by the patient, have an available bed and are willing to accept the patient.  Yes   Patient/family informed of [place]'s ownership interest in [facility] and [facility], as well as of the fact that they are under no obligation to receive care at these facilities.  PASRR submitted to EDS on       PASRR number received on       Existing PASRR number confirmed on 08/15/16     FL2 transmitted to all facilities in geographic area requested by pt/family on 08/15/16     FL2 transmitted to all facilities within larger geographic area on       Patient informed that his/her managed care company has contracts with or will negotiate with certain facilities, including the following:            Patient/family informed of bed offers received.  Patient chooses bed at       Physician recommends and patient chooses bed at      Patient to be transferred to   on  .  Patient to be transferred to facility by       Patient family notified on   of transfer.  Name of family member notified:        PHYSICIAN       Additional Comment:    _______________________________________________ Jamyia Fortune, Darleen CrockerBailey M, LCSW 08/15/2016, 3:27 PM

## 2016-08-15 NOTE — Progress Notes (Signed)
Abdominal binder placed as ordered.  

## 2016-08-15 NOTE — Evaluation (Signed)
Physical Therapy Evaluation Patient Details Name: [name] MRN: [id number] DOB: [birth year] Today's Date: 08/15/2016   History of Present Illness  83 yo female with onset of pain in abd was admitted, has discitis L2-3 in lumbar spine, bronchitis vs PNA per imaging report.  PMHx:  CHF, COPD, a-fib, neuropathy, R heel ulcer with varied states of healing over the past few months.    Clinical Impression  Pt is up to attempt transfer to chair, but is not able to stand up for a short time or take steps.  Her PLOF was walking short trips on RW on level surfaces until she went home from SNF.  Now pt is more dependent for transitions and willl need to do some restoration of PLOF.  Have plan to see acutely and focus on strength, balance and control of sitting to stand to progress her mobility as much as possible.      Follow Up Recommendations SNF    Equipment Recommendations  None recommended by PT    Recommendations for Other Services       Precautions / Restrictions Precautions Precautions: Fall (telemetry) Restrictions Weight Bearing Restrictions: No Other Position/Activity Restrictions: previously used Darco shoes for protection of B feet      Mobility  Bed Mobility Overal bed mobility: Needs Assistance Bed Mobility: Supine to Sit     Supine to sit: Mod assist        Transfers Overall transfer level: Needs assistance Equipment used: Rolling walker (2 wheeled);1 person hand held assist Transfers: Sit to/from [employer] CorporationStand;Stand Pivot Transfers Sit to Stand: Mod assist Stand pivot transfers: Mod assist       General transfer comment: pt is assisted by PT due to her UE weakness to directly use walker  Ambulation/Gait             General Gait Details: unable  Stairs            Wheelchair Mobility    Modified Rankin (Stroke Patients Only)       Balance Overall balance assessment: Needs assistance Sitting-balance support: Feet supported;Bilateral upper  extremity supported Sitting balance-Leahy Scale: Fair     Standing balance support: Bilateral upper extremity supported Standing balance-Leahy Scale: Poor                               Pertinent Vitals/Pain Pain Assessment: Faces Faces Pain Scale: Hurts little more Pain Location: B shoulders with transfers Pain Intervention(s): Monitored during session;Repositioned    Home Living Family/patient expects to be discharged to:: Private residence Living Arrangements: Children Available Help at Discharge: Family;Available PRN/intermittently Type of Home: House Home Access: Ramped entrance     Home Layout: One level Home Equipment: Walker - 2 wheels;Transport chair Additional Comments: Lives with daughter who [facility] part time [facility]    Prior Function Level of Independence: Needs assistance   Gait / Transfers Assistance Needed: was ambulatory for short trips on RW prior to leaving SNF 2 months ago  ADL's / Engineer, waterHomemaking Assistance Needed: provided by family        Hand Dominance   Dominant Hand: Right    Extremity/Trunk Assessment   Upper Extremity Assessment Upper Extremity Assessment: Generalized weakness    Lower Extremity Assessment Lower Extremity Assessment: Generalized weakness    Cervical / Trunk Assessment Cervical / Trunk Assessment: Kyphotic  Communication   Communication: No difficulties  Cognition Arousal/Alertness: Awake/alert Behavior During Therapy: WFL for tasks assessed/performed Overall Cognitive  Status: Within Functional Limits for tasks assessed                      General Comments      Exercises     Assessment/Plan    PT Assessment Patient needs continued PT services  PT Problem List Decreased strength;Decreased range of motion;Decreased activity tolerance;Decreased balance;Decreased mobility;Decreased coordination;Decreased knowledge of use of DME;Decreased safety awareness;Cardiopulmonary status limiting  activity;Decreased skin integrity;Pain       PT Treatment Interventions DME instruction;Gait training;Stair training;Functional mobility training;Therapeutic activities;Therapeutic exercise;Balance training;Patient/family education;Neuromuscular re-education    PT Goals (Current goals can be found in the Care Plan section)  Acute Rehab PT Goals Patient Stated Goal: to feel better and get stronger to get home PT Goal Formulation: With patient Time For Goal Achievement: 08/29/16    Frequency Min 2X/week   Barriers to discharge Decreased caregiver support daughter cannot be there 24/7    Co-evaluation               End of Session Equipment Utilized During Treatment: Gait belt;Back brace Activity Tolerance: Patient tolerated treatment well;Patient limited by fatigue Patient left: in chair;with call bell/phone within reach;with chair alarm set Nurse Communication: Mobility status PT Visit Diagnosis: Unsteadiness on feet (R26.81);Muscle weakness (generalized) (M62.81)         Time: 1610-9604 PT Time Calculation (min) (ACUTE ONLY): 33 min   Charges:   PT Evaluation $PT Eval Moderate Complexity: 1 Procedure PT Treatments $Therapeutic Activity: 8-22 mins   PT G Codes:         Ivar Drape 09-09-2016, 3:38 PM   Samul Dada, PT MS Acute Rehab Dept. Number: [facility] [id number] and [facility] [phone]

## 2016-08-16 LAB — GLUCOSE, CAPILLARY
GLUCOSE-CAPILLARY: 149 mg/dL — AB (ref 65–99)
GLUCOSE-CAPILLARY: 158 mg/dL — AB (ref 65–99)
Glucose-Capillary: 134 mg/dL — ABNORMAL HIGH (ref 65–99)
Glucose-Capillary: 160 mg/dL — ABNORMAL HIGH (ref 65–99)

## 2016-08-16 LAB — BASIC METABOLIC PANEL WITH GFR
Anion gap: 11 (ref 5–15)
BUN: 44 mg/dL — ABNORMAL HIGH (ref 6–20)
CO2: 31 mmol/L (ref 22–32)
Calcium: 8.7 mg/dL — ABNORMAL LOW (ref 8.9–10.3)
Chloride: 91 mmol/L — ABNORMAL LOW (ref 101–111)
Creatinine, Ser: 4.24 mg/dL — ABNORMAL HIGH (ref 0.44–1.00)
GFR calc Af Amer: 10 mL/min — ABNORMAL LOW
GFR calc non Af Amer: 9 mL/min — ABNORMAL LOW
Glucose, Bld: 141 mg/dL — ABNORMAL HIGH (ref 65–99)
Potassium: 4.1 mmol/L (ref 3.5–5.1)
Sodium: 133 mmol/L — ABNORMAL LOW (ref 135–145)

## 2016-08-16 LAB — PROTIME-INR
INR: 1.22
Prothrombin Time: 15.5 s — ABNORMAL HIGH (ref 11.4–15.2)

## 2016-08-16 LAB — ACID FAST SMEAR (AFB, MYCOBACTERIA)

## 2016-08-16 LAB — ACID FAST SMEAR (AFB): ACID FAST SMEAR - AFSCU2: NEGATIVE

## 2016-08-16 MED ORDER — WARFARIN SODIUM 5 MG PO TABS
5.0000 mg | ORAL_TABLET | Freq: Once | ORAL | Status: AC
  Administered 2016-08-16: 5 mg via ORAL
  Filled 2016-08-16 (×2): qty 1

## 2016-08-16 NOTE — Progress Notes (Signed)
[facility]  ROUNDING NOTE   Subjective:  Patient resting comfortably in bed this a.m. She will be due for hemodialysis again tomorrow. She remains on intravenous antibiotics for underlying discitis.   Objective:  Vital signs in last 24 hours:  Temp:  [98.1 F (36.7 C)-99.3 F (37.4 C)] 99.3 F (37.4 C) (03/18 1215) Pulse Rate:  [82-107] 82 (03/18 1215) Resp:  [15-20] 15 (03/18 1215) BP: (134-138)/(52-68) 135/52 (03/18 1215) SpO2:  [96 %-100 %] 99 % (03/18 1215)  Weight change:  Filed Weights   08/12/16 1507 08/12/16 1807 08/14/16 1141  Weight: 71.5 kg (157 lb 10.1 oz) 69.8 kg (153 lb 14.1 oz) 70.9 kg (156 lb 4.9 oz)    Intake/Output: No intake/output data recorded.   Intake/Output this shift:  Total I/O In: 240 [P.O.:240] Out: -   Physical Exam: General: No acute distress  Head: Normocephalic, atraumatic. Moist oral mucosal membranes  Eyes: Anicteric  Neck: Supple, trachea midline  Lungs:  Clear to auscultation, normal effort  Heart: S1S2 no rubs  Abdomen:  Soft, nontender, bowel sounds present  Extremities: 1+ peripheral edema  Neurologic: Nonfocal, moving all four extremities  Skin: No lesions  Access: RUE AVF    Basic Metabolic Panel:  Recent Labs Lab 08/09/16 2011 08/10/16 0327 08/10/16 1040 08/11/16 0401 08/12/16 0509 08/14/16 0516 08/16/16 0329  NA 132* 127*  --  134* 133*  --  133*  K 6.1* 5.6*  --  4.0 4.7  --  4.1  CL 93* 91*  --  94* 93*  --  91*  CO2 24 22  --  30 28  --  31  GLUCOSE 127* 110*  --  111* 105*  --  141*  BUN 65* 65*  --  28* 48*  --  44*  CREATININE 5.46* 5.37*  --  3.20* 4.57*  --  4.24*  CALCIUM 8.7* 8.9  --  8.5* 8.8*  --  8.7*  PHOS  --   --  5.8*  --   --  3.9  --     Liver Function Tests:  Recent Labs Lab 08/09/16 2011  AST 18  ALT 12*  ALKPHOS 69  BILITOT 0.6  PROT 7.4  ALBUMIN 3.2*   No results for input(s): LIPASE, AMYLASE in the last 168 hours. No results for input(s): AMMONIA in the  last 168 hours.  CBC:  Recent Labs Lab 08/09/16 2011 08/10/16 0327 08/13/16 0525 08/15/16 0403  WBC 7.5 8.0 8.7 8.7  NEUTROABS 5.7  --   --   --   HGB 10.1* 10.1* 10.3* 10.6*  HCT 29.0* 29.1* 30.5* 31.4*  MCV 96.7 97.2 96.3 98.3  PLT 251 252 239 203    Cardiac Enzymes: No results for input(s): CKTOTAL, CKMB, CKMBINDEX, TROPONINI in the last 168 hours.  BNP: Invalid input(s): POCBNP  CBG:  Recent Labs Lab 08/15/16 1143 08/15/16 1648 08/15/16 2125 08/16/16 0756 08/16/16 1214  GLUCAP 164* 146* 179* 134* 160*    Microbiology: Results for orders placed or performed during the hospital encounter of 08/09/16  Urine culture     Status: Abnormal   Collection Time: 08/09/16 10:04 PM  Result Value Ref Range Status   Specimen Description URINE, RANDOM  Final   Special Requests NONE  Final   Culture (A)  Final    >=100,000 COLONIES/mL GROUP B STREP(S.AGALACTIAE)ISOLATED TESTING AGAINST S. AGALACTIAE NOT ROUTINELY PERFORMED DUE TO PREDICTABILITY OF AMP/PEN/VAN SUSCEPTIBILITY. Performed at [facility], [address] [place] [address]    Report Status 08/11/2016 FINAL  Final  Blood culture (routine x 2)     Status: None   Collection Time: 08/09/16 11:54 PM  Result Value Ref Range Status   Specimen Description BLOOD LEFT UPPER ARM  Final   Special Requests   Final    BOTTLES DRAWN AEROBIC AND ANAEROBIC BCHV HIGH VOLUME   Culture NO GROWTH 5 DAYS  Final   Report Status 08/15/2016 FINAL  Final  Blood culture (routine x 2)     Status: None   Collection Time: 08/09/16 11:59 PM  Result Value Ref Range Status   Specimen Description BLOOD LEFT FOREARM  Final   Special Requests   Final    BOTTLES DRAWN AEROBIC AND ANAEROBIC BCAV ADEQUATE VOLUME   Culture NO GROWTH 5 DAYS  Final   Report Status 08/15/2016 FINAL  Final  MRSA PCR Screening     Status: None   Collection Time: 08/10/16  2:29 AM  Result Value Ref Range Status   MRSA by PCR NEGATIVE NEGATIVE  Final    Comment:        The GeneXpert MRSA Assay (FDA approved for NASAL specimens only), is one component of a comprehensive MRSA colonization surveillance program. It is not intended to diagnose MRSA infection nor to guide or monitor treatment for MRSA infections.   Culture, fungus without smear     Status: None (Preliminary result)   Collection Time: 08/14/16 11:15 AM  Result Value Ref Range Status   Specimen Description ABSCESS  Final   Special Requests Normal  Final   Culture   Final    NO FUNGUS ISOLATED AFTER 2 DAYS Performed at [facility], [address], [place], [place] [address]    Report Status PENDING  Incomplete  Aerobic/Anaerobic Culture (surgical/deep wound)     Status: None (Preliminary result)   Collection Time: 08/14/16 11:15 AM  Result Value Ref Range Status   Specimen Description ABSCESS L2 L3  Final   Special Requests Normal  Final   Gram Stain   Final    ABUNDANT WBC PRESENT, PREDOMINANTLY PMN RARE GRAM POSITIVE COCCI IN PAIRS    Culture   Final    NO GROWTH 2 DAYS NO ANAEROBES ISOLATED; CULTURE IN PROGRESS FOR 5 DAYS Performed at [facility], [address] [place], [place] [address]    Report Status PENDING  Incomplete  Acid Fast Smear (AFB)     Status: None   Collection Time: 08/14/16 11:15 AM  Result Value Ref Range Status   AFB Specimen Processing Concentration  Final   Acid Fast Smear Negative  Final    Comment: (NOTE) Performed At: [facility] [address] [place], [place] [id number] Mila Homer MD IO:[id number]    Source (AFB) L2 L3 ABCESS  Final    Coagulation Studies:  Recent Labs  08/14/16 0516 08/15/16 0403 08/16/16 0329  LABPROT 15.8* 16.6* 15.5*  INR 1.25 1.33 1.22    Urinalysis: No results for input(s): COLORURINE, LABSPEC, PHURINE, GLUCOSEU, HGBUR, BILIRUBINUR, KETONESUR, PROTEINUR, UROBILINOGEN, NITRITE, LEUKOCYTESUR in the last 72 hours.  Invalid input(s): APPERANCEUR     Imaging: No results found.   Medications:   . sodium chloride 10 mL/hr at 08/14/16 1000   . calcium acetate  1,334 mg Oral TID WC  . [START ON 08/17/2016] cefTAZidime (FORTAZ)  IV  2 g Intravenous Q M,W,F-HD  . diltiazem  120 mg Oral BH-q7a  . epoetin (EPOGEN/PROCRIT) injection  4,000 Units Intravenous Q M,W,F-HD  . famotidine  20 mg Oral Daily  . gabapentin  100 mg Oral Q T,Th,S,Su  . gabapentin  300 mg Oral Q M,W,F-2000  . insulin aspart  0-5 Units Subcutaneous QHS  . insulin aspart  0-9 Units Subcutaneous TID WC  . pravastatin  40 mg Oral QPM  . vancomycin  1,500 mg Intravenous Once  . [START ON 08/17/2016] vancomycin  1,000 mg Intravenous Q M,W,F-HD  . warfarin  5 mg Oral ONCE-1800  . Warfarin - Pharmacist Dosing Inpatient   Does not apply q1800   acetaminophen **OR** acetaminophen, albuterol, ondansetron **OR** ondansetron (ZOFRAN) IV, traMADol  Assessment/ Plan:  83 y.o. female with atrial fibrillation on Coumadin, hypertension, gout, insulin-dependent diabetes mellitus type 2, diabetic neuropathy, CVA with right visual field blindness, hyperlipidemia, major depressive disorder, overactive bladder with incontinence, generalized anxiety disorder, congestive heart failure diastolic, parathyroidectomy, and osteoarthritis  MWF [facility] [address]   1. End Stage Renal Disease:  - Patient due for hemodialysis again tomorrow. We will prepare orders.  2. Discitis. Patient appears to have phlegmon between L2 and L3.  Aspiration of this fluid collected and cultures are still pending.  Outpatient antibiotics called in to RN Mark at [facility]. [employer]. Patient currently on vancomycin and Ceptaz ceftazidime.  3. Hypertension:   blood pressure stable at 135/52. Continue the patient on current doses of diltiazem.  4. Secondary Hyperparathyroidism: Recheck phosphorus with dialysis tomorrow. Continue calcium acetate 2 tablets by mouth 3 times a day with meals.  5. Anemia  of chronic kidney disease:  Hemoglobin at target at 10.6. Continue Epogen 4000 units IV with dialysis.   LOS: 6 Daejon Lich 3/18/20182:37 PM

## 2016-08-16 NOTE — Progress Notes (Signed)
ANTICOAGULATION CONSULT NOTE -  Pharmacy Consult for warfarin  Indication: atrial fibrillation  Allergies  Allergen Reactions  . Codeine Itching and Other (See Comments)    Reaction:  Confusion/Hallucinations   . Contrast Media [Iodinated Diagnostic Agents] Other (See Comments)    "skin peel" breaks into a blistered rash, then peel. If given Benadryl and solumedrol she will be okay  . Oxycodone Other (See Comments)    Reaction:  Confusion/Hallucinations   . Quinine Derivatives Rash  . Tape Other (See Comments)    Skin is very thin and will blister and pull off skin with any tape except paper tape.  PLEASE USE PAPER TAPE.    Patient Measurements: Height: 5\' 5"  (165.1 cm) Weight: 156 lb 4.9 oz (70.9 kg) IBW/kg (Calculated) : 57  Vital Signs: Temp: 98.1 F (36.7 C) (03/18 0639) Temp Source: Oral (03/18 0639) BP: 138/68 (03/18 0639) Pulse Rate: 107 (03/18 0639)  Labs:  Recent Labs  08/14/16 0516 08/15/16 0403 08/16/16 0329  HGB  --  10.6*  --   HCT  --  31.4*  --   PLT  --  203  --   LABPROT 15.8* 16.6* 15.5*  INR 1.25 1.33 1.22  CREATININE  --   --  4.24*    Estimated Creatinine Clearance: 10.5 mL/min (A) (by C-G formula based on SCr of 4.24 mg/dL (H)).   Medical History: Past Medical History:  Diagnosis Date  . Anemia   . Anxiety   . Arthritis    gout  . Atrial fibrillation (HCC)   . CHF (congestive heart failure) (HCC)    has had this off and on prior to dialysis but has had several times since  . Chronic kidney disease    end stage renal insufficiency.  on dialysis M,W,F  . Diabetes mellitus without complication (HCC)   . Dialysis patient [facility](H[facility])    Mon.- Wed- Fri.  . Dysrhythmia    afib/flutter  . GERD (gastroesophageal reflux disease)   . Hypertension   . 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 Peripheral vascular disease (HCC)   . Pleural effusion   . Pulmonary hypertension   . Renal insufficiency   . Restless leg syndrome   .  Shortness of breath dyspnea    with exertion  . Stroke (HCC) 06/2015   has had 1 other stroke. first one affected vision. the most recent one affected right arm    Assessment: 83 yo female with past medical history of  A. Fib. Pharmacy consulted for warfarin dosing.   Home Regimen: Warfarin 4mg  Tues, Thurs                             Warfarin 2mg  all other days   DATE   INR DOSE 3/16  1.25 4 mg 3/17  1.33 2 mg 3/18  1.22   Goal of Therapy:  INR 2-3 Monitor platelets by anticoagulation protocol: Yes   Plan:  Will order Warfarin 5 mg x 1. Will order INRs daily until therapeutic. Patient on Ceftazidime/Vancomycin  Angelique BlonderMerrill,Usman Millett A, PharmD, BCPS Clinical Pharmacist 08/16/2016 10:48 AM

## 2016-08-16 NOTE — Progress Notes (Signed)
[facility] [place] [facility]   PATIENT NAME: [name]    MR#:  [id number]  DATE OF BIRTH:  [birth year]  SUBJECTIVE:  CHIEF COMPLAINT:   Chief Complaint  Patient presents with  . Abdominal Pain    Lower groin and lower abdomen   - Patient had aspiration of the fluid from her lumbar spine -March 16th 2018. Patient tolerated procedure well-  MRI cannot be done due to back stimulator - no fevers, on ceftazidime and vancomycin. The patient feels better now since she was placed on lumbar corset. Still has some back pain, lower abdominal pain as radiculopathy, denies any nausea, vomiting, admits of poor oral intake. The patient eats  about 25% of offered meals.  REVIEW OF SYSTEMS:  Review of Systems  Constitutional: Positive for malaise/fatigue. Negative for chills and fever.  HENT: Negative for congestion, ear discharge, hearing loss and nosebleeds.   Eyes: Negative for blurred vision.  Respiratory: Negative for cough, shortness of breath and wheezing.   Cardiovascular: Negative for chest pain, palpitations and leg swelling.  Gastrointestinal: Negative for abdominal pain, constipation, diarrhea, nausea and vomiting.  Genitourinary: Negative for dysuria.  Musculoskeletal: Positive for back pain.  Neurological: Negative for dizziness, seizures and headaches.    DRUG ALLERGIES:   Allergies  Allergen Reactions  . Codeine Itching and Other (See Comments)    Reaction:  Confusion/Hallucinations   . Contrast Media [Iodinated Diagnostic Agents] Other (See Comments)    "skin peel" breaks into a blistered rash, then peel. If given Benadryl and solumedrol she will be okay  . Oxycodone Other (See Comments)    Reaction:  Confusion/Hallucinations   . Quinine Derivatives Rash  . Tape Other (See Comments)    Skin is very thin and will blister and pull off skin with any tape except paper tape.  PLEASE USE PAPER TAPE.    VITALS:  Blood pressure (!) 135/52, pulse 82,  temperature 99.3 F (37.4 C), temperature source Oral, resp. rate 15, height 5\' 5"  (1.651 m), weight 70.9 kg (156 lb 4.9 oz), SpO2 99 %.  PHYSICAL EXAMINATION:  Physical Exam  GENERAL:  83 y.o.-year-old patient lying in the bed, comfortable today  EYES: Pupils equal, round, reactive to light and accommodation. No scleral icterus. Extraocular muscles intact.  HEENT: Head atraumatic, normocephalic. Oropharynx and nasopharynx clear. Dry oral mucosa NECK:  Supple, no jugular venous distention. No thyroid enlargement, no tenderness.  LUNGS: Normal breath sounds bilaterally, no wheezing, rales,rhonchi or crepitation. No use of accessory muscles of respiration.  CARDIOVASCULAR S1. S2, irregularly irregular No  rubs, or gallops. 3/6 systolic murmur present ABDOMEN: Soft, nontender, nondistended. Bowel sounds present. No organomegaly or mass.  EXTREMITIES: No pedal edema, cyanosis, or clubbing. Right leg dressing in place significant discomfort in lumbar spine. Palpation  NEUROLOGIC: Cranial nerves II through XII are intact. Muscle strength 5/5 in all extremities. Sensation intact. Gait not checked.  PSYCHIATRIC: The patient is alert and oriented x 3.  SKIN: No obvious rash, lesion, or ulcer.  Lumbar corset is placed on  LABORATORY PANEL:   CBC  Recent Labs Lab 08/15/16 0403  WBC 8.7  HGB 10.6*  HCT 31.4*  PLT 203   ------------------------------------------------------------------------------------------------------------------  Chemistries   Recent Labs Lab 08/09/16 2011  08/16/16 0329  NA 132*  < > 133*  K 6.1*  < > 4.1  CL 93*  < > 91*  CO2 24  < > 31  GLUCOSE 127*  < > 141*  BUN  65*  < > 44*  CREATININE 5.46*  < > 4.24*  CALCIUM 8.7*  < > 8.7*  AST 18  --   --   ALT 12*  --   --   ALKPHOS 69  --   --   BILITOT 0.6  --   --   < > = values in this interval not  displayed. ------------------------------------------------------------------------------------------------------------------  Cardiac Enzymes No results for input(s): TROPONINI in the last 168 hours. ------------------------------------------------------------------------------------------------------------------  RADIOLOGY:  No results found.  EKG:   Orders placed or performed during the hospital encounter of 08/09/16  . EKG 12-Lead  . EKG 12-Lead    ASSESSMENT AND PLAN:   83 y/o F with PMH of low back pain chronic, ESRD on M-W-F hemodialysis, afib on coumadin, HTN, neuropathy, PVD admitted with abdominal pain and noted to have L2-L3 discitis  #1 . Lumbar radiculopathy/Lumbar discitis, L2-L3  -Status post aspiration of the fluid from the lumbar disc area 08/14/2016. Patient tolerated procedure well. No fungus was also dilated in 2 days, he had gram-positive cocci in pairs were noted. No anaerobes- CT abd with right non obstructing renal stone-patient denies any flank pain, dysuria -Appreciate ID consult-  recommended to continue antibiotics fortaz and vanc during dialysis for the next 6 weeks through 09/22/2016 . Follow-up with infectious disease Dr. Sampson Goon and nephrology as outpatient. Dr. Lourdes Sledge is aware -recommended MRI without contrast (dialysis patient) - but cannot be done due to a spinal stimulator - s/p aspiration from the phlegmon at the L2-L3 region by IR- coumadin held from March 13 and resumed Coumadin  08/14/2016, INR is 1.22, not therapeutic  - heating pad and conservative treatment until then, lumbar corset , clinically improved, more comfortable. Very likely to be discharged to skilled nursing facility tomorrow for rehabilitation, as recommended by physical therapist  #2 Right heel ulcer- wet-to-dry dressing changes -Follow up with wound care   #3 ESRD on HD- for dialysis as per nephrology  Hyponatremia and hyperkalemia resolved with dialysis. The body, etc.  ordered for outpatient delivery, discussed with Dr. Cherylann Ratel   #4 Afib- rate controlled, on oral cardizem, - coumadin  resumed , INR is 1.22, follow closely   #5 Neuropathy- on gabapentin  #6 DVT Prophylaxis- hold  coumadin   #7. Low back pain, due to discitis, continue supportive therapy, lidocaine, Aspercreme, heating pad, lumbar support, Physical Therapy consulted,  skilled nursing facility placement is recommended due to significant lower back pains, medically improved today  #8. Failure to thrive adult, patient's oral intake is not satisfactory, she consumes only 25% of offered meals, I discussed this patient quite extensively today, she promised to try her best, may need to initiate appetite  stimulating medications     All the records are reviewed and case discussed with Care Management/Social Workerr. Management plans discussed with the patient, family and they are in agreement.  CODE STATUS: DNR  TOTAL TIME TAKING CARE OF THIS PATIENT: 30 minutes.   Marland Kitchen Discharge planning was discussed with her patient's daughter,  all questions were answered, she voiced understanding  POSSIBLE D/C IN 2 DAYS, DEPENDING ON CLINICAL CONDITION.   Katharina Caper M.D on 08/16/2016 at 2:00 PM  Between 7am to 6pm - Pager - [phone]  After 6pm go to [email or web address] - Social research officer, government  [facility]  Office  [phone]  CC: Primary care physician; [name], MD

## 2016-08-17 DIAGNOSIS — M545 Low back pain, unspecified: Secondary | ICD-10-CM

## 2016-08-17 DIAGNOSIS — E875 Hyperkalemia: Secondary | ICD-10-CM

## 2016-08-17 DIAGNOSIS — E871 Hypo-osmolality and hyponatremia: Secondary | ICD-10-CM

## 2016-08-17 LAB — PROTIME-INR
INR: 1.14
PROTHROMBIN TIME: 14.7 s (ref 11.4–15.2)

## 2016-08-17 LAB — GLUCOSE, CAPILLARY
GLUCOSE-CAPILLARY: 175 mg/dL — AB (ref 65–99)
GLUCOSE-CAPILLARY: 179 mg/dL — AB (ref 65–99)
Glucose-Capillary: 116 mg/dL — ABNORMAL HIGH (ref 65–99)

## 2016-08-17 LAB — VANCOMYCIN, RANDOM: Vancomycin Rm: 20

## 2016-08-17 MED ORDER — VANCOMYCIN HCL 10 G IV SOLR
1500.0000 mg | Freq: Once | INTRAVENOUS | Status: AC
  Administered 2016-08-17: 1500 mg via INTRAVENOUS
  Filled 2016-08-17: qty 1500

## 2016-08-17 MED ORDER — IPRATROPIUM-ALBUTEROL 0.5-2.5 (3) MG/3ML IN SOLN
3.0000 mL | Freq: Four times a day (QID) | RESPIRATORY_TRACT | Status: DC
  Administered 2016-08-17: 3 mL via RESPIRATORY_TRACT
  Filled 2016-08-17 (×2): qty 3

## 2016-08-17 MED ORDER — VANCOMYCIN HCL IN DEXTROSE 1-5 GM/200ML-% IV SOLN: 1000.0000 mg | INTRAVENOUS | Status: DC

## 2016-08-17 MED ORDER — VANCOMYCIN HCL IN DEXTROSE 1-5 GM/200ML-% IV SOLN: 1000.0000 mg | INTRAVENOUS | 0 refills | Status: DC

## 2016-08-17 MED ORDER — TRAMADOL HCL 50 MG PO TABS: 50.0000 mg | ORAL_TABLET | Freq: Four times a day (QID) | ORAL | 0 refills | Status: DC | PRN

## 2016-08-17 MED ORDER — DEXTROSE 5 % IV SOLN: 2.0000 g | INTRAVENOUS | 0 refills | Status: DC

## 2016-08-17 NOTE — Progress Notes (Signed)
[facility].

## 2016-08-17 NOTE — Progress Notes (Signed)
Post hd assessment 

## 2016-08-17 NOTE — Care Management Important Message (Signed)
Important Message  Patient Details  Name: Helane RimaJoann Y Setzler MRN: [id number] Date of Birth: [birth year]   Medicare Important Message Given:  Yes    Chapman FitchBOWEN, Jathan Balling T, RN 08/17/2016, 11:48 AM

## 2016-08-17 NOTE — Progress Notes (Signed)
Pre hd assessment  

## 2016-08-17 NOTE — Discharge Summary (Signed)
[facility] - [place] at [facility]   PATIENT NAME: [name]    MR#:  [id number]  DATE OF BIRTH:  1934/04/02  DATE OF ADMISSION:  08/09/2016 ADMITTING PHYSICIAN: Oralia Manis, MD  DATE OF DISCHARGE: No discharge date for patient encounter.  PRIMARY CARE PHYSICIAN: [name], MD     ADMISSION DIAGNOSIS:  End stage renal disease (HCC) [N18.6] Hyperkalemia [E87.5] Lower abdominal pain [R10.30] Discitis of lumbosacral region [M46.47]  DISCHARGE DIAGNOSIS:  Principal Problem:   Discitis Active Problems:   Lower back pain   Chronic atrial fibrillation (HCC)   Hyponatremia   Hyperkalemia   End stage renal disease on dialysis (HCC)   Diabetes mellitus (HCC)   Heel ulcer (HCC)   SECONDARY DIAGNOSIS:   Past Medical History:  Diagnosis Date  . Anemia   . Anxiety   . Arthritis    gout  . Atrial fibrillation (HCC)   . CHF (congestive heart failure) (HCC)    has had this off and on prior to dialysis but has had several times since  . Chronic kidney disease    end stage renal insufficiency.  on dialysis M,W,F  . Diabetes mellitus without complication (HCC)   . Dialysis patient [facility])    Mon.- Wed- Fri.  . Dysrhythmia    afib/flutter  . GERD (gastroesophageal reflux disease)   . Hypertension   . 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 Peripheral vascular disease (HCC)   . Pleural effusion   . Pulmonary hypertension   . Renal insufficiency   . Restless leg syndrome   . Shortness of breath dyspnea    with exertion  . Stroke (HCC) 06/2015   has had 1 other stroke. first one affected vision. the most recent one affected right arm    .pro HOSPITAL COURSE:   The patient is a 83 year old Caucasian female with past medical history significant for history of end-stage renal disease, on hemodialysis Mondays, Wednesdays, Fridays, chronic atrial fibrillation, congestive heart failure, diabetes, hypertension, neuropathy,  peripheral vascular disease, pulmonary hypertension, restless leg syndrome, who presents to the hospital with complaints of lower back and the lower abdominal pain.  CT scan of abdomen and pelvis revealed changes in L2-L3 interspace suspicious for discitis/osteomyelitis and inflammatory changes in the adjacent paraspinal and psoas muscles. Patient was admitted to the hospital for further evaluation and treatment, initiated on broad-spectrum antibiotic therapy, interventional radiologist was contacted and patient underwent aspiration of 2 cc of thin bloody fluid from L2-L3 interspace under fluoroscopy which was   sent to labs. Blood cultures showed no growth. Urine culture revealed more than 100,000 colony-forming units of group B streptococcus. Abscess aspiration cultures are still pending, however, no fungus was isolated after 2 days. Patient was seen by infectious disease specialist, Dr. Sampson Goon, who recommended to continue vancomycin and ceftazidime as outpatient with dialysis for 6 weeks. He also recommended to continue weekly labs while on IV antibiotics, including CBC with differential, comprehensive metabolic panel, vancomycin trough, CPR. Planned duration of antibiotic should be 6 weeks from 08/14/2016, that means through 09/25/2016. Patient's lower back was immobilized with lumbar corset, she was given pain medications and her condition overall improved. She was evaluated by physical therapist and recommended skilled nursing facility placement for rehabilitation where she will be discharged today. Discussion by problem: #1 . Lumbar radiculopathy/Lumbar discitis, L2-L3  -Status post aspiration of the fluid from the lumbar disc area 08/14/2016. Patient tolerated procedure well. No fungus was  also isolated in 2 days, rate gram-positive cocci in pairs were noted. No anaerobes- CT abd with right non obstructing renal stone-patient denies any flank pain, dysuria, although her urinary cultures revealed  Streptococcus group B -Appreciate ID consult-  recommended to continue antibiotics fortaz and vanc during dialysis for the next 6 weeks through 09/25/2016 . Follow-up with infectious disease Dr. Sampson Goon and nephrology as outpatient. Dr. Lourdes Sledge is aware -recommended MRI without contrast (dialysis patient) - but cannot be done due to a spinal stimulator - s/p aspiration from the phlegmon at the L2-L3 region by IR- coumadin held from March 13 and resumed Coumadin  08/14/2016, INR is 1.14, not therapeutic, needs to have labs followed closely until therapeutic above 2.0 - heating pad and conservative treatment until then, lumbar corset, clinically improved, more comfortable. Discharge to skilled nursing facility today for rehabilitation, as recommended by physical therapist  #2 Right heel ulcer- wet-to-dry dressing changes -Follow up with wound care as outpatient  #3 ESRD on HD- for dialysis as per nephrology  Hyponatremia and hyperkalemia resolved with dialysis.   #4 Afib- rate controlled, on oral cardizem, - coumadin  resumed , INR is 1.14, follow closely   #5 Neuropathy- on gabapentin  #6 DVT Prophylaxis-  coumadin to be advanced, INR is subtherapeutic at 1.14 today   #7. Low back pain, due to discitis, continue supportive therapy, lidocaine/EMLA cream prn, heating pad, lumbar support, Physical Therapy consulted,  skilled nursing facility placement is recommended due to significant lower back pains, medically improved overall. Discussed with neurosurgery, recommended neurosurgical evaluation as outpatient if continuous back pain after antibiotic therapy is completed  #8. Failure to thrive adult, patient's oral intake is not satisfactory, she consumes only 25% of offered meals, I discussed this patient and her daughter today, . Apparently patient does not eat hospital offered meals due to high content of phosphorus, which is not advisable for end-stage renal disease patient. Patient is to  continue dialysis diet as outpatient   DISCHARGE CONDITIONS:   Stable   CONSULTS OBTAINED:  Treatment Team:  Mady Haagensen, MD Mick Sell, MD Keith Rake, MD  DRUG ALLERGIES:   Allergies  Allergen Reactions  . Codeine Itching and Other (See Comments)    Reaction:  Confusion/Hallucinations   . Contrast Media [Iodinated Diagnostic Agents] Other (See Comments)    "skin peel" breaks into a blistered rash, then peel. If given Benadryl and solumedrol she will be okay  . Oxycodone Other (See Comments)    Reaction:  Confusion/Hallucinations   . Quinine Derivatives Rash  . Tape Other (See Comments)    Skin is very thin and will blister and pull off skin with any tape except paper tape.  PLEASE USE PAPER TAPE.    DISCHARGE MEDICATIONS:   Current Discharge Medication List    START taking these medications   Details  cefTAZidime 2 g in dextrose 5 % 50 mL Inject 2 g into the vein every Monday, Wednesday, and Friday with hemodialysis. Qty: 16 Dose, Refills: 0    vancomycin (VANCOCIN) 1-5 GM/200ML-% SOLN Inject 200 mLs (1,000 mg total) into the vein every Monday, Wednesday, and Friday with hemodialysis. Qty: 16000 mL, Refills: 0      CONTINUE these medications which have CHANGED   Details  traMADol (ULTRAM) 50 MG tablet Take 1 tablet (50 mg total) by mouth every 6 (six) hours as needed for moderate pain. Qty: 30 tablet, Refills: 0      CONTINUE these medications which have NOT CHANGED  Details  acetaminophen (TYLENOL) 650 MG CR tablet Take 1,300 mg by mouth 2 (two) times daily.    albuterol (PROVENTIL HFA;VENTOLIN HFA) 108 (90 Base) MCG/ACT inhaler Inhale 1 puff into the lungs every 6 (six) hours as needed for wheezing or shortness of breath.     Biotin 1 MG CAPS Take 1 mg by mouth daily.     calcium acetate (PHOSLO) 667 MG tablet Take 1,334 mg by mouth 3 (three) times daily with meals. With any food    diltiazem (TIAZAC) 120 MG 24 hr capsule Take 120 mg by mouth  every morning.     folic acid-vitamin b complex-vitamin c-selenium-zinc (DIALYVITE) 3 MG TABS tablet Take 1 tablet by mouth daily.    furosemide (LASIX) 80 MG tablet Take 80 mg by mouth 2 (two) times daily.    !! gabapentin (NEURONTIN) 100 MG capsule Take 100 mg by mouth every Tuesday, Thursday, Saturday, and Sunday. At bedtime    !! gabapentin (NEURONTIN) 300 MG capsule Take 300 mg by mouth every Monday, Wednesday, and Friday at 8 PM. At bedtime    insulin detemir (LEVEMIR) 100 UNIT/ML injection Inject 0.06 mLs (6 Units total) into the skin 2 (two) times daily. Qty: 10 mL, Refills: 11    lidocaine-prilocaine (EMLA) cream Apply 1 application topically as needed (port access). Mon, Wed, Fri before dialysis    Omega-3 Fatty Acids (FISH OIL) 1000 MG CAPS Take 1,000 mg by mouth 2 (two) times daily.    PARoxetine (PAXIL) 40 MG tablet Take 40 mg by mouth at bedtime.     pravastatin (PRAVACHOL) 40 MG tablet Take 40 mg by mouth every evening.    ranitidine (ZANTAC) 150 MG tablet Take 150 mg by mouth at bedtime.     warfarin (COUMADIN) 4 MG tablet Take 2-4 mg by mouth daily at 8 pm. Patient take 4mg  on Tuesday and Thursday. 2mg  on all other days.     !! - Potential duplicate medications found. Please discuss with provider.       DISCHARGE INSTRUCTIONS:    The patient is to follow-up with primary care physician as outpatient  If you experience worsening of your admission symptoms, develop shortness of breath, life threatening emergency, suicidal or homicidal thoughts you must seek medical attention immediately by calling 911 or calling your MD immediately  if symptoms less severe.  You Must read complete instructions/literature along with all the possible adverse reactions/side effects for all the Medicines you take and that have been prescribed to you. Take any new Medicines after you have completely understood and accept all the possible adverse reactions/side effects.   Please  note  You were cared for by a hospitalist during your hospital stay. If you have any questions about your discharge medications or the care you received while you were in the hospital after you are discharged, you can call the unit and asked to speak with the hospitalist on call if the hospitalist that took care of you is not available. Once you are discharged, your primary care physician will handle any further medical issues. Please note that NO REFILLS for any discharge medications will be authorized once you are discharged, as it is imperative that you return to your primary care physician (or establish a relationship with a primary care physician if you do not have one) for your aftercare needs so that they can reassess your need for medications and monitor your lab values.    Today   CHIEF COMPLAINT:   Chief Complaint  Patient presents with  . Abdominal Pain    Lower groin and lower abdomen    HISTORY OF PRESENT ILLNESS:  [name]  is a 83 y.o. female with a known history of end-stage renal disease, on hemodialysis Mondays, Wednesdays, Fridays, chronic atrial fibrillation, congestive heart failure, diabetes, hypertension, neuropathy, peripheral vascular disease, pulmonary hypertension, restless leg syndrome, who presents to the hospital with complaints of lower back and the lower abdominal pain.  CT scan of abdomen and pelvis revealed changes in L2-L3 interspace suspicious for discitis/osteomyelitis and inflammatory changes in the adjacent paraspinal and psoas muscles. Patient was admitted to the hospital for further evaluation and treatment, initiated on broad-spectrum antibiotic therapy, interventional radiologist was contacted and patient underwent aspiration of 2 cc of thin bloody fluid from L2-L3 interspace under fluoroscopy which was   sent to labs. Blood cultures showed no growth. Urine culture revealed more than 100,000 colony-forming units of group B streptococcus. Abscess  aspiration cultures are still pending, however, no fungus was isolated after 2 days. Patient was seen by infectious disease specialist, Dr. Sampson Goon, who recommended to continue vancomycin and ceftazidime as outpatient with dialysis for 6 weeks. He also recommended to continue weekly labs while on IV antibiotics, including CBC with differential, comprehensive metabolic panel, vancomycin trough, CPR. Planned duration of antibiotic should be 6 weeks from 08/14/2016, that means through 09/25/2016. Patient's lower back was immobilized with lumbar corset, she was given pain medications and her condition overall improved. She was evaluated by physical therapist and recommended skilled nursing facility placement for rehabilitation where she will be discharged today. Discussion by problem: #1 . Lumbar radiculopathy/Lumbar discitis, L2-L3  -Status post aspiration of the fluid from the lumbar disc area 08/14/2016. Patient tolerated procedure well. No fungus was also isolated in 2 days, rate gram-positive cocci in pairs were noted. No anaerobes- CT abd with right non obstructing renal stone-patient denies any flank pain, dysuria, although her urinary cultures revealed Streptococcus group B -Appreciate ID consult-  recommended to continue antibiotics fortaz and vanc during dialysis for the next 6 weeks through 09/25/2016 . Follow-up with infectious disease Dr. Sampson Goon and nephrology as outpatient. Dr. Lourdes Sledge is aware -recommended MRI without contrast (dialysis patient) - but cannot be done due to a spinal stimulator - s/p aspiration from the phlegmon at the L2-L3 region by IR- coumadin held from March 13 and resumed Coumadin  08/14/2016, INR is 1.14, not therapeutic, needs to have labs followed closely until therapeutic above 2.0 - heating pad and conservative treatment until then, lumbar corset, clinically improved, more comfortable. Discharge to skilled nursing facility today for rehabilitation, as recommended by  physical therapist  #2 Right heel ulcer- wet-to-dry dressing changes -Follow up with wound care as outpatient  #3 ESRD on HD- for dialysis as per nephrology  Hyponatremia and hyperkalemia resolved with dialysis.   #4 Afib- rate controlled, on oral cardizem, - coumadin  resumed , INR is 1.14, follow closely   #5 Neuropathy- on gabapentin  #6 DVT Prophylaxis-  coumadin to be advanced, INR is subtherapeutic at 1.14 today   #7. Low back pain, due to discitis, continue supportive therapy, lidocaine/EMLA cream prn, heating pad, lumbar support, Physical Therapy consulted,  skilled nursing facility placement is recommended due to significant lower back pains, medically improved overall. Discussed with neurosurgery, recommended neurosurgical evaluation as outpatient if continuous back pain after antibiotic therapy is completed  #8. Failure to thrive adult, patient's oral intake is not satisfactory, she consumes only 25% of offered meals, I discussed  this patient and her daughter today, . Apparently patient does not eat hospital offered meals due to high content of phosphorus, which is not advisable for end-stage renal disease patient. Patient is to continue dialysis diet as outpatient     VITAL SIGNS:  Blood pressure (!) 152/62, pulse 91, temperature 98.2 F (36.8 C), temperature source Oral, resp. rate 20, height 5\' 5"  (1.651 m), weight 70.9 kg (156 lb 4.9 oz), SpO2 93 %.  I/O:   Intake/Output Summary (Last 24 hours) at 08/17/16 1338 Last data filed at 08/17/16 1303  Gross per 24 hour  Intake              320 ml  Output                0 ml  Net              320 ml    PHYSICAL EXAMINATION:  GENERAL:  83 y.o.-year-old patient lying in the bed with no acute distress.  EYES: Pupils equal, round, reactive to light and accommodation. No scleral icterus. Extraocular muscles intact.  HEENT: Head atraumatic, normocephalic. Oropharynx and nasopharynx clear.  NECK:  Supple, no jugular  venous distention. No thyroid enlargement, no tenderness.  LUNGS: Normal breath sounds bilaterally, no wheezing, rales,rhonchi or crepitation. No use of accessory muscles of respiration.  CARDIOVASCULAR: S1, S2 normal. No murmurs, rubs, or gallops.  ABDOMEN: Soft, non-tender, non-distended. Bowel sounds present. No organomegaly or mass.  EXTREMITIES: No pedal edema, cyanosis, or clubbing.  NEUROLOGIC: Cranial nerves II through XII are intact. Muscle strength 5/5 in all extremities. Sensation intact. Gait not checked.  PSYCHIATRIC: The patient is alert and oriented x 3.  SKIN: No obvious rash, lesion, or ulcer.   DATA REVIEW:   CBC  Recent Labs Lab 08/15/16 0403  WBC 8.7  HGB 10.6*  HCT 31.4*  PLT 203    Chemistries   Recent Labs Lab 08/16/16 0329  NA 133*  K 4.1  CL 91*  CO2 31  GLUCOSE 141*  BUN 44*  CREATININE 4.24*  CALCIUM 8.7*    Cardiac Enzymes No results for input(s): TROPONINI in the last 168 hours.  Microbiology Results  Results for orders placed or performed during the hospital encounter of 08/09/16  Urine culture     Status: Abnormal   Collection Time: 08/09/16 10:04 PM  Result Value Ref Range Status   Specimen Description URINE, RANDOM  Final   Special Requests NONE  Final   Culture (A)  Final    >=100,000 COLONIES/mL GROUP B STREP(S.AGALACTIAE)ISOLATED TESTING AGAINST S. AGALACTIAE NOT ROUTINELY PERFORMED DUE TO PREDICTABILITY OF AMP/PEN/VAN SUSCEPTIBILITY. Performed at [facility], [address], [place], [place] [address]    Report Status 08/11/2016 FINAL  Final  Blood culture (routine x 2)     Status: None   Collection Time: 08/09/16 11:54 PM  Result Value Ref Range Status   Specimen Description BLOOD LEFT UPPER ARM  Final   Special Requests   Final    BOTTLES DRAWN AEROBIC AND ANAEROBIC BCHV HIGH VOLUME   Culture NO GROWTH 5 DAYS  Final   Report Status 08/15/2016 FINAL  Final  Blood culture (routine x 2)     Status: None    Collection Time: 08/09/16 11:59 PM  Result Value Ref Range Status   Specimen Description BLOOD LEFT FOREARM  Final   Special Requests   Final    BOTTLES DRAWN AEROBIC AND ANAEROBIC BCAV ADEQUATE VOLUME   Culture  NO GROWTH 5 DAYS  Final   Report Status 08/15/2016 FINAL  Final  MRSA PCR Screening     Status: None   Collection Time: 08/10/16  2:29 AM  Result Value Ref Range Status   MRSA by PCR NEGATIVE NEGATIVE Final    Comment:        The GeneXpert MRSA Assay (FDA approved for NASAL specimens only), is one component of a comprehensive MRSA colonization surveillance program. It is not intended to diagnose MRSA infection nor to guide or monitor treatment for MRSA infections.   Culture, fungus without smear     Status: None (Preliminary result)   Collection Time: 08/14/16 11:15 AM  Result Value Ref Range Status   Specimen Description ABSCESS  Final   Special Requests Normal  Final   Culture   Final    NO FUNGUS ISOLATED AFTER 2 DAYS Performed at [facility], [address] [place], [place] [address]    Report Status PENDING  Incomplete  Aerobic/Anaerobic Culture (surgical/deep wound)     Status: None (Preliminary result)   Collection Time: 08/14/16 11:15 AM  Result Value Ref Range Status   Specimen Description ABSCESS L2 L3  Final   Special Requests Normal  Final   Gram Stain   Final    ABUNDANT WBC PRESENT, PREDOMINANTLY PMN RARE GRAM POSITIVE COCCI IN PAIRS    Culture   Final    NO GROWTH 2 DAYS NO ANAEROBES ISOLATED; CULTURE IN PROGRESS FOR 5 DAYS Performed at [facility], [address] [place], [place] [address]    Report Status PENDING  Incomplete  Acid Fast Smear (AFB)     Status: None   Collection Time: 08/14/16 11:15 AM  Result Value Ref Range Status   AFB Specimen Processing Concentration  Final   Acid Fast Smear Negative  Final    Comment: (NOTE) Performed At: [facility] [address] [place], [place] [id number] Mila HomerHancock William F  MD GL:[id number]:[phone]    Source (AFB) L2 L3 ABCESS  Final    RADIOLOGY:  No results found.  EKG:   Orders placed or performed during the hospital encounter of 08/09/16  . EKG 12-Lead  . EKG 12-Lead      Management plans discussed with the patient, family and they are in agreement.  CODE STATUS:     Code Status Orders        Start     Ordered   08/10/16 0144  Do not attempt resuscitation (DNR)  Continuous    Question Answer Comment  In the event of cardiac or respiratory ARREST Do not call a "code blue"   In the event of cardiac or respiratory ARREST Do not perform Intubation, CPR, defibrillation or ACLS   In the event of cardiac or respiratory ARREST Use medication by any route, position, wound care, and other measures to relive pain and suffering. May use oxygen, suction and manual treatment of airway obstruction as needed for comfort.      08/10/16 0143    Code Status History    Date Active Date Inactive Code Status Order ID Comments User Context   04/27/2016 11:23 AM 04/27/2016  2:48 PM Full Code [id number]  Annice NeedyJason S Dew, MD Inpatient   02/14/2016  8:31 PM 02/18/2016  5:05 PM DNR [id number]  Auburn BilberryShreyang Patel, MD ED   02/14/2016  8:31 PM 02/14/2016  8:31 PM Full Code [id number]  Auburn BilberryShreyang Patel, MD ED   10/12/2015  5:49 PM 10/15/2015  3:46  PM DNR [id number]  Altamese Dilling, MD Inpatient   09/14/2015 10:46 PM 09/18/2015  8:40 PM Full Code [id number]  Marguarite Arbour, MD Inpatient   08/13/2015  1:01 PM 08/16/2015  4:09 PM Full Code [id number]  Altamese Dilling, MD Inpatient   06/18/2015 11:41 PM 06/25/2015  1:04 AM DNR [id number]  Ramonita Lab, MD Inpatient   06/07/2015  3:46 PM 06/07/2015  6:48 PM Full Code [id number]  Gwyneth Revels, DPM Inpatient   12/11/2014 10:46 PM 12/13/2014  9:38 PM DNR [id number]  Enedina Finner, MD Inpatient   12/11/2014  9:10 PM 12/11/2014 10:46 PM DNR [id number]  Enedina Finner, MD ED    Advance Directive Documentation     Most Recent Value  Type of Advance Directive   Healthcare Power of Attorney  Pre-existing out of facility DNR order (yellow form or pink MOST form)  -  "MOST" Form in Place?  -      TOTAL TIME TAKING CARE OF THIS PATIEN29minutes.   discussed this patient's daughter   Katharina Caper M.D on 08/17/2016 at 1:38 PM  Between 7am to 6pm - Pager - [phone]  After 6pm go to [email or web address] - password [email or web address] [facility]  [facility]  Office  [phone]  CC: Primary care physician; [name], MD

## 2016-08-17 NOTE — Care Management (Signed)
Natalie ChyleAmanda Morris HD liaison notified of plan for discharge.

## 2016-08-17 NOTE — Progress Notes (Signed)
Physical Therapy Treatment Patient Details Name: [name] MRN: [id number] DOB: [birth year] Today's Date: 08/17/2016    History of Present Illness 83 yo female with onset of pain in abd was admitted, has discitis L2-3 in lumbar spine, bronchitis vs PNA per imaging report.  PMHx:  CHF, COPD, a-fib, neuropathy, R heel ulcer with varied states of healing over the past few months.      PT Comments    Pt agreeable to PT, but feeling a bit rushed/stressed as she is eating lunch and has dialysis post lunch. Pt agreeable to short exercise session, noting she is eager to get stronger. Pt participates well with assist as needed; written instructions provided for supine lower extremity exercises. Offered sitting edge of bed to finish lunch for up in bed tolerance, but pt refuses. Continue PT to progress endurance and strength to improve functional mobility.   Follow Up Recommendations  SNF     Equipment Recommendations  None recommended by PT    Recommendations for Other Services       Precautions / Restrictions Precautions Precautions: Fall Restrictions Weight Bearing Restrictions: No    Mobility  Bed Mobility               General bed mobility comments: not tested, pt notes she can sit edge of bed with help,, but does not wish too currently  Transfers                    Ambulation/Gait                 Stairs            Wheelchair Mobility    Modified Rankin (Stroke Patients Only)       Balance                                    Cognition Arousal/Alertness: Awake/alert Behavior During Therapy: WFL for tasks assessed/performed Overall Cognitive Status: Within Functional Limits for tasks assessed                      Exercises General Exercises - Lower Extremity Ankle Circles/Pumps: AROM;Both;20 reps Quad Sets: Strengthening;Both;10 reps;Supine Gluteal Sets: Strengthening;Both;10 reps;Supine Short Arc Quad:  AROM;Both;10 reps;Supine Heel Slides: AAROM;AROM;Both;10 reps;Supine Hip ABduction/ADduction: AAROM;Both;10 reps;Supine    General Comments        Pertinent Vitals/Pain Pain Assessment: No/denies pain    Home Living                      Prior Function            PT Goals (current goals can now be found in the care plan section) Progress towards PT goals: Progressing toward goals    Frequency    Min 2X/week      PT Plan Current plan remains appropriate    Co-evaluation             End of Session Equipment Utilized During Treatment: Oxygen Activity Tolerance: Patient tolerated treatment well;Other (comment) (SOB; pt wishing to finish lunch then going to dialysis) Patient left: in bed;with call bell/phone within reach;with bed alarm set   PT Visit Diagnosis: Unsteadiness on feet (R26.81);Muscle weakness (generalized) (M62.81)     Time: 1316-1330 PT Time Calculation (min) (ACUTE ONLY): 14 min  Charges:  $Therapeutic Exercise: 8-22 mins  G Codes[place]ot Dock:       Heidi E Barnes, PTA 08/17/2016, 1:43 PM

## 2016-08-17 NOTE — Progress Notes (Signed)
Pre HD  

## 2016-08-17 NOTE — Progress Notes (Signed)
Post hd vitals 

## 2016-08-17 NOTE — Clinical Social Work Note (Signed)
Patient has received bed [facility]. [facility] Kid[facility] at [facility] is going to contact her daughter to complete admission paperwork today. Discharge information sent to [facility].  York SpanielMonica Loyal Rudy MSW,LCSW [phone]

## 2016-08-17 NOTE — Progress Notes (Signed)
HD initiated without issue at bedside in patient room. All safety checks performed pre treatment. Patient currently has no complaints. 2k bath. Continue to monitor.

## 2016-08-17 NOTE — Progress Notes (Signed)
Pharmacy Antibiotic Note  [name] is a [age] y.o. female admitted on 08/09/2016 with osteomyelitis.  Pharmacy has been consulted for Vancomycin and Ceftazidime dosing. Patient is ESRD on HD 3 times a week. Will be discharged on both Vancomycin and Ceftazidime for total of 6 weeks of therapy beginning 3/16.  Plan: Ordered vancomycin 1500mg  IV once to begin now as patient had not received vancomycin at all during visit. Will check vancomycin random after dialysis today to see if vancomycin needed after dialysis. Goal trough 15-3625mcg/mL.    Height: 5\' 5"  (165.1 cm) Weight: 156 lb 4.9 oz (70.9 kg) IBW/kg (Calculated) : 57  Temp (24hrs), Avg:98.9 F (37.2 C), Min:98.6 F (37 C), Max:99.3 F (37.4 C)   Recent Labs Lab 08/11/16 0401 08/12/16 0509 08/13/16 0525 08/15/16 0403 08/16/16 0329  WBC  --   --  8.7 8.7  --   CREATININE 3.20* 4.57*  --   --  4.24*    Estimated Creatinine Clearance: 10.5 mL/min (A) (by C-G formula based on SCr of 4.24 mg/dL (H)).    Allergies  Allergen Reactions  . Codeine Itching and Other (See Comments)    Reaction:  Confusion/Hallucinations   . Contrast Media [Iodinated Diagnostic Agents] Other (See Comments)    "skin peel" breaks into a blistered rash, then peel. If given Benadryl and solumedrol she will be okay  . Oxycodone Other (See Comments)    Reaction:  Confusion/Hallucinations   . Quinine Derivatives Rash  . Tape Other (See Comments)    Skin is very thin and will blister and pull off skin with any tape except paper tape.  PLEASE USE PAPER TAPE.    Antimicrobials this admission: Vancomycin 3/16 >> 4/27 Ceftazidime 3/16 >> 4/27  Thank you for allowing pharmacy to be a part of this patient's care.  Delsa BernKelly m Valrie Jia, PharmD 08/17/2016 7:46 AM

## 2016-08-17 NOTE — Progress Notes (Signed)
  End of hd 

## 2016-08-17 NOTE — Clinical Social Work Placement (Signed)
   CLINICAL SOCIAL WORK PLACEMENT  NOTE  Date:  08/17/2016  Patient Details  Name: [name] MRN: [id number] Date of Birth: July 31, 1933  Clinical Social Work is seeking post-discharge placement for this patient at the Skilled  Nursing Facility level of care (*CSW will initial, date and re-position this form in  chart as items are completed):  Yes   Patient/family provided with [facility] Clinical Social Work Department's list of facilities offering this level of care within the geographic area requested by the patient (or if unable, by the patient's family).  Yes   Patient/family informed of their freedom to choose among providers that offer the needed level of care, that participate in Medicare, Medicaid or managed care program needed by the patient, have an available bed and are willing to accept the patient.  Yes   Patient/family informed of [facility]'s ownership interest in [facility] and [facility], as well as of the fact that they are under no obligation to receive care at these facilities.  PASRR submitted to EDS on       PASRR number received on       Existing PASRR number confirmed on 08/15/16     FL2 transmitted to all facilities in geographic area requested by pt/family on 08/15/16     FL2 transmitted to all facilities within larger geographic area on       Patient informed that his/her managed care company has contracts with or will negotiate with certain facilities, including the following:        Yes   Patient/family informed of bed offers received.  Patient chooses bed at  [facility]     Physician recommends and patient chooses bed at  [facility])    Patient to be transferred to  [facility] on 08/17/16.  Patient to be transferred to facility by  (EMS)     Patient family notified on 08/17/16 of transfer.  Name of family member notified:   (patient/daughter)     PHYSICIAN       Additional Comment:     _______________________________________________ York SpanielMonica Therma Lasure, LCSW 08/17/2016, 2:41 PM

## 2016-08-17 NOTE — Progress Notes (Signed)
Pt d/c with EMS in stable condition. Papers and prescriptions sent with pt.

## 2016-08-17 NOTE — Progress Notes (Signed)
EMS called

## 2016-08-17 NOTE — Progress Notes (Signed)
[facility]  ROUNDING NOTE   Subjective:   Seen and examined on hemodialysis treatment. Tolerating treatment well.    Objective:  Vital signs in last 24 hours:  Temp:  [98.2 F (36.8 C)-98.8 F (37.1 C)] 98.2 F (36.8 C) (03/19 1314) Pulse Rate:  [68-92] 87 (03/19 1600) Resp:  [12-22] 14 (03/19 1600) BP: (121-154)/(53-70) 121/53 (03/19 1600) SpO2:  [92 %-99 %] 92 % (03/19 1330) Weight:  [69.1 kg (152 lb 5.4 oz)] 69.1 kg (152 lb 5.4 oz) (03/19 1330)  Weight change:  Filed Weights   08/12/16 1807 08/14/16 1141 08/17/16 1330  Weight: 69.8 kg (153 lb 14.1 oz) 70.9 kg (156 lb 4.9 oz) 69.1 kg (152 lb 5.4 oz)    Intake/Output: I/O last 3 completed shifts: In: 360 [P.O.:360] Out: 0    Intake/Output this shift:  Total I/O In: 200 [IV Piggyback:200] Out: -   Physical Exam: General: No acute distress  Head: Normocephalic, atraumatic. Moist oral mucosal membranes  Eyes: Anicteric  Neck: Supple, trachea midline  Lungs:  Clear to auscultation, normal effort  Heart: S1S2 no rubs  Abdomen:  Soft, nontender, bowel sounds present  Extremities: no peripheral edema  Neurologic: Nonfocal, moving all four extremities  Skin: No lesions  Access: RUE AVF    Basic Metabolic Panel:  Recent Labs Lab 08/11/16 0401 08/12/16 0509 08/14/16 0516 08/16/16 0329  NA 134* 133*  --  133*  K 4.0 4.7  --  4.1  CL 94* 93*  --  91*  CO2 30 28  --  31  GLUCOSE 111* 105*  --  141*  BUN 28* 48*  --  44*  CREATININE 3.20* 4.57*  --  4.24*  CALCIUM 8.5* 8.8*  --  8.7*  PHOS  --   --  3.9  --     Liver Function Tests: No results for input(s): AST, ALT, ALKPHOS, BILITOT, PROT, ALBUMIN in the last 168 hours. No results for input(s): LIPASE, AMYLASE in the last 168 hours. No results for input(s): AMMONIA in the last 168 hours.  CBC:  Recent Labs Lab 08/13/16 0525 08/15/16 0403  WBC 8.7 8.7  HGB 10.3* 10.6*  HCT 30.5* 31.4*  MCV 96.3 98.3  PLT 239 203    Cardiac  Enzymes: No results for input(s): CKTOTAL, CKMB, CKMBINDEX, TROPONINI in the last 168 hours.  BNP: Invalid input(s): POCBNP  CBG:  Recent Labs Lab 08/16/16 1214 08/16/16 1632 08/16/16 2120 08/17/16 0814 08/17/16 1302  GLUCAP 160* 149* 158* 175* 179*    Microbiology: Results for orders placed or performed during the hospital encounter of 08/09/16  Urine culture     Status: Abnormal   Collection Time: 08/09/16 10:04 PM  Result Value Ref Range Status   Specimen Description URINE, RANDOM  Final   Special Requests NONE  Final   Culture (A)  Final    >=100,000 COLONIES/mL GROUP B STREP(S.AGALACTIAE)ISOLATED TESTING AGAINST S. AGALACTIAE NOT ROUTINELY PERFORMED DUE TO PREDICTABILITY OF AMP/PEN/VAN SUSCEPTIBILITY. Performed at [facility], [address] [place], [place] [address]    Report Status 08/11/2016 FINAL  Final  Blood culture (routine x 2)     Status: None   Collection Time: 08/09/16 11:54 PM  Result Value Ref Range Status   Specimen Description BLOOD LEFT UPPER ARM  Final   Special Requests   Final    BOTTLES DRAWN AEROBIC AND ANAEROBIC BCHV HIGH VOLUME   Culture NO GROWTH 5 DAYS  Final   Report Status 08/15/2016  FINAL  Final  Blood culture (routine x 2)     Status: None   Collection Time: 08/09/16 11:59 PM  Result Value Ref Range Status   Specimen Description BLOOD LEFT FOREARM  Final   Special Requests   Final    BOTTLES DRAWN AEROBIC AND ANAEROBIC BCAV ADEQUATE VOLUME   Culture NO GROWTH 5 DAYS  Final   Report Status 08/15/2016 FINAL  Final  MRSA PCR Screening     Status: None   Collection Time: 08/10/16  2:29 AM  Result Value Ref Range Status   MRSA by PCR NEGATIVE NEGATIVE Final    Comment:        The GeneXpert MRSA Assay (FDA approved for NASAL specimens only), is one component of a comprehensive MRSA colonization surveillance program. It is not intended to diagnose MRSA infection nor to guide or monitor treatment for MRSA infections.    Culture, fungus without smear     Status: None (Preliminary result)   Collection Time: 08/14/16 11:15 AM  Result Value Ref Range Status   Specimen Description ABSCESS  Final   Special Requests Normal  Final   Culture   Final    NO FUNGUS ISOLATED AFTER 3 DAYS Performed at [facility], [address], [place], [place] [address]    Report Status PENDING  Incomplete  Aerobic/Anaerobic Culture (surgical/deep wound)     Status: None (Preliminary result)   Collection Time: 08/14/16 11:15 AM  Result Value Ref Range Status   Specimen Description ABSCESS L2 L3  Final   Special Requests Normal  Final   Gram Stain   Final    ABUNDANT WBC PRESENT, PREDOMINANTLY PMN RARE GRAM POSITIVE COCCI IN PAIRS    Culture   Final    NO GROWTH 2 DAYS NO ANAEROBES ISOLATED; CULTURE IN PROGRESS FOR 5 DAYS Performed at [facility], [address], [place], [place] [address]    Report Status PENDING  Incomplete  Acid Fast Smear (AFB)     Status: None   Collection Time: 08/14/16 11:15 AM  Result Value Ref Range Status   AFB Specimen Processing Concentration  Final   Acid Fast Smear Negative  Final    Comment: (NOTE) Performed At: [facility] [address] [place], [place] [id number] Mila Homer MD WG:[id number]    Source (AFB) L2 L3 ABCESS  Final    Coagulation Studies:  Recent Labs  08/15/16 0403 08/16/16 0329 08/17/16 0356  LABPROT 16.6* 15.5* 14.7  INR 1.33 1.22 1.14    Urinalysis: No results for input(s): COLORURINE, LABSPEC, PHURINE, GLUCOSEU, HGBUR, BILIRUBINUR, KETONESUR, PROTEINUR, UROBILINOGEN, NITRITE, LEUKOCYTESUR in the last 72 hours.  Invalid input(s): APPERANCEUR    Imaging: No results found.   Medications:   . sodium chloride 10 mL/hr at 08/14/16 1000   . calcium acetate  1,334 mg Oral TID WC  . cefTAZidime (FORTAZ)  IV  2 g Intravenous Q M,W,F-HD  . diltiazem  120 mg Oral BH-q7a  . epoetin (EPOGEN/PROCRIT) injection  4,000 Units  Intravenous Q M,W,F-HD  . famotidine  20 mg Oral Daily  . gabapentin  100 mg Oral Q T,Th,S,Su  . gabapentin  300 mg Oral Q M,W,F-2000  . insulin aspart  0-5 Units Subcutaneous QHS  . insulin aspart  0-9 Units Subcutaneous TID WC  . ipratropium-albuterol  3 mL Nebulization Q6H  . pravastatin  40 mg Oral QPM  . [START ON 08/19/2016] vancomycin  1,000 mg Intravenous Q M,W,F-HD  . Warfarin -  Pharmacist Dosing Inpatient   Does not apply q1800   acetaminophen **OR** acetaminophen, albuterol, ondansetron **OR** ondansetron (ZOFRAN) IV, traMADol  Assessment/ Plan:  83 y.o. female with atrial fibrillation on Coumadin, hypertension, gout, insulin-dependent diabetes mellitus type 2, diabetic neuropathy, CVA with right visual field blindness, hyperlipidemia, major depressive disorder, overactive bladder with incontinence, generalized anxiety disorder, congestive heart failure diastolic, parathyroidectomy, and osteoarthritis  MWF [facility] [address]   1. End Stage Renal Disease: Tolerating hemodialysis treatment.   2. Discitis: cultures pending - Appreciate ID input - IV vancomycin and ceftazidime.   3. Hypertension:    - diltiazem  4. Secondary Hyperparathyroidism:   Continue calcium acetate 2 tablets by mouth 3 times a day with meals.  5. Anemia of chronic kidney disease:   - Continue Epogen 4000 units IV with dialysis.   LOS: 7 [name] 3/19/20184:08 PM

## 2016-08-19 ENCOUNTER — Encounter: Payer: No Typology Code available for payment source | Admitting: Internal Medicine

## 2016-08-19 DIAGNOSIS — E1151 Type 2 diabetes mellitus with diabetic peripheral angiopathy without gangrene: Secondary | ICD-10-CM | POA: Diagnosis not present

## 2016-08-19 DIAGNOSIS — Z992 Dependence on renal dialysis: Secondary | ICD-10-CM | POA: Diagnosis not present

## 2016-08-19 DIAGNOSIS — I482 Chronic atrial fibrillation: Secondary | ICD-10-CM | POA: Diagnosis not present

## 2016-08-19 DIAGNOSIS — I5033 Acute on chronic diastolic (congestive) heart failure: Secondary | ICD-10-CM | POA: Diagnosis not present

## 2016-08-19 DIAGNOSIS — E1122 Type 2 diabetes mellitus with diabetic chronic kidney disease: Secondary | ICD-10-CM | POA: Diagnosis not present

## 2016-08-19 DIAGNOSIS — E11621 Type 2 diabetes mellitus with foot ulcer: Secondary | ICD-10-CM | POA: Diagnosis not present

## 2016-08-19 DIAGNOSIS — Z794 Long term (current) use of insulin: Secondary | ICD-10-CM | POA: Diagnosis not present

## 2016-08-19 DIAGNOSIS — L97512 Non-pressure chronic ulcer of other part of right foot with fat layer exposed: Secondary | ICD-10-CM | POA: Diagnosis not present

## 2016-08-19 DIAGNOSIS — Z8673 Personal history of transient ischemic attack (TIA), and cerebral infarction without residual deficits: Secondary | ICD-10-CM | POA: Diagnosis not present

## 2016-08-19 DIAGNOSIS — I132 Hypertensive heart and chronic kidney disease with heart failure and with stage 5 chronic kidney disease, or end stage renal disease: Secondary | ICD-10-CM | POA: Diagnosis not present

## 2016-08-19 DIAGNOSIS — N186 End stage renal disease: Secondary | ICD-10-CM | POA: Diagnosis not present

## 2016-08-19 LAB — AEROBIC/ANAEROBIC CULTURE (SURGICAL/DEEP WOUND): CULTURE: NO GROWTH

## 2016-08-19 LAB — AEROBIC/ANAEROBIC CULTURE W GRAM STAIN (SURGICAL/DEEP WOUND): Special Requests: NORMAL

## 2016-08-20 NOTE — Progress Notes (Signed)
Natalie, [name] ([id number]) Visit Report for 08/19/2016 Arrival Information Details Patient Name: [name], [name]. Date of Service: 08/19/2016 8:00 AM Medical Record Patient Account Number: [id number] Number: Treating RN: Phillis Haggis [birth year] (83 y.o. Other Clinician: Date of Birth/Sex: Female) Treating ROBSON, MICHAEL Primary Care Zeniyah Peaster: [name] Makinzy Cleere/Extender: G Referring Sabre Leonetti: Charolotte Capuchin in Treatment: 35 Visit Information History Since Last Visit All ordered tests and consults were completed: No Patient Arrived: Wheel Chair Added or deleted any medications: No Arrival Time: 08:09 Any new allergies or adverse reactions: No Accompanied By: daughter Had a fall or experienced change in No Transfer Assistance: EasyPivot Patient activities of daily living that may affect Lift risk of falls: Patient Identification Verified: Yes Signs or symptoms of abuse/neglect since last No Secondary Verification Process Yes visito Completed: Hospitalized since last visit: No Patient Requires Transmission- No Has Dressing in Place as Prescribed: Yes Based Precautions: Pain Present Now: No Patient Has Alerts: Yes Patient Alerts: Patient on Blood Thinner DMII Warfarin ABI [place] Bilateral NO BP RIGHT ARM Electronic Signature(s) Signed: 08/19/2016 4:46:19 PM By: Alejandro Mulling Entered By: Alejandro Mulling on 08/19/2016 08:11:13 [name] ([id number]) -------------------------------------------------------------------------------- Encounter Discharge Information Details Patient Name: [name]. Date of Service: 08/19/2016 8:00 AM Medical Record Patient Account Number: [id number] Number: Treating RN: Phillis Haggis 07/23/1933 (83 y.o. Other Clinician: Date of Birth/Sex: Female) Treating ROBSON, MICHAEL Primary Care Claudia Greenley: [name] Jarman Litton/Extender: G Referring Jaime Dome: Charolotte Capuchin in Treatment:  63 Encounter Discharge Information Items Discharge Pain Level: 0 Discharge Condition: Stable Ambulatory Status: Wheelchair Discharge Destination: Nursing Home Transportation: Private Auto Accompanied By: daughter Schedule Follow-up Appointment: Yes Medication Reconciliation completed and provided to Patient/Care No Joscelin Fray: Provided on Clinical Summary of Care: 08/19/2016 Form Type Recipient Paper Patient [facility] Electronic Signature(s) Signed: 08/19/2016 4:46:19 PM By: Alejandro Mulling Previous Signature: 08/19/2016 8:45:14 AM Version By: Gwenlyn Perking Entered By: Alejandro Mulling on 08/19/2016 09:07:27 [name] ([id number]) -------------------------------------------------------------------------------- Lower Extremity Assessment Details Patient Name: [name] Date of Service: 08/19/2016 8:00 AM Medical Record Patient Account Number: [id number] Number: Treating RN: Phillis Haggis [birth year] (83 y.o. Other Clinician: Date of Birth/Sex: Female) Treating ROBSON, MICHAEL Primary Care Calogero Geisen: [name] Maeli Spacek/Extender: G Referring Alonnah Lampkins: Charolotte Capuchin in Treatment: 35 Vascular Assessment Pulses: Dorsalis Pedis Palpable: [Right:Yes] Posterior Tibial Extremity colors, hair growth, and conditions: Extremity Color: [Right:Hyperpigmented] Temperature of Extremity: [Right:Warm] Capillary Refill: [Right:< 3 seconds] Electronic Signature(s) Signed: 08/19/2016 4:46:19 PM By: Alejandro Mulling Entered By: Alejandro Mulling on 08/19/2016 09:04:36 [name] ([id number]) -------------------------------------------------------------------------------- Multi Wound Chart Details Patient Name: [name] Date of Service: 08/19/2016 8:00 AM Medical Record Patient Account Number: [id number] Number: Treating RN: Phillis Haggis [birth year] (83 y.o. 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] Comorbid History: [9:Arrhythmia, Congestive N/A Heart Failure, Hypertension, Type II Diabetes] Date Acquired: [9:06/02/2015] [N/A:N/A] Weeks of Treatment: [9:35] [N/A:N/A] Wound Status: [9:Open] [N/A:N/A] Pending Amputation on Yes [N/A:N/A] Presentation: Measurements L x W x D 0.1x0.1x0.1 [N/A:N/A] (cm) Area (cm) : [9:0.008] [N/A:N/A] Volume (cm) : [9:0.001] [N/A:N/A] % Reduction in Area: [9:100.00%] [N/A:N/A] %  Reduction in Volume: 100.00% [N/A:N/A] Classification: [9:Grade 2] [N/A:N/A] Exudate Amount: [9:None Present] [N/A:N/A] Wound Margin: [9:Distinct, outline attached N/A] Granulation Amount: [9:None Present (0%)] [N/A:N/A] Necrotic Amount: [9:Large (67-100%)] [N/A:N/A] Necrotic Tissue: [9:Eschar] [N/A:N/A] Epithelialization: [9:None] [N/A:N/A] Debridement: Debridement (40981- N/A N/A 11047) Pre-procedure 08:30 N/A N/A Verification/Time Out Taken: Pain Control: Lidocaine 4% Topical N/A N/A Solution Tissue Debrided: Fibrin/Slough, Exudates, N/A N/A Subcutaneous Level: Skin/Subcutaneous N/A N/A Tissue Debridement Area (sq 0.01 N/A N/A cm): Instrument: Blade, Forceps N/A N/A Bleeding: Minimum N/A N/A Hemostasis Achieved: Pressure N/A N/A Procedural Pain: 0 N/A N/A Post Procedural Pain: 0 N/A N/A Debridement Treatment Procedure was tolerated N/A N/A Response: well Post Debridement 2x0.5x0.1 N/A N/A Measurements L x W x  D (cm) Post Debridement 0.079 N/A N/A Volume: (cm) Periwound Skin Texture: No Abnormalities Noted N/A N/A Periwound Skin No Abnormalities Noted N/A N/A Moisture: Periwound Skin Color: No Abnormalities Noted N/A N/A Temperature: No Abnormality N/A N/A Tenderness on Yes N/A N/A Palpation: Wound Preparation: Ulcer Cleansing: N/A N/A Rinsed/Irrigated with Saline, Other: soap and water Topical Anesthetic Applied: Other: lidocaine 4% Procedures Performed: Debridement N/A N/A Treatment Notes Wound #9 (Right Achilles) 1. Cleansed with: Clean wound with Normal Saline 2. Anesthetic Topical Lidocaine 4% cream to wound bed prior to debridement 4. Dressing Applied: LORRAIN, RIVERS ([id number]) Prisma Ag 5. Secondary Dressing Applied ABD Pad Dry Gauze Kerlix/Conform 7. Secured with Tape Notes kerlix, coban Electronic Signature(s) Signed: 08/19/2016 4:46:19 PM By: Alejandro Mulling Entered By: Alejandro Mulling on 08/19/2016 09:04:49 [name] ([id number]) -------------------------------------------------------------------------------- Multi-Disciplinary Care Plan Details Patient Name: SHAKURA, COWING. Date of Service: 08/19/2016 8:00 AM Medical Record Patient Account Number: [id number] Number: Treating RN: Phillis Haggis [birth year] (82 y.o. Other Clinician: Date of Birth/Sex: Female) Treating ROBSON, MICHAEL Primary Care Makar Slatter: [name] Ronica Vivian/Extender: G Referring Kinnedy Mongiello: Charolotte Capuchin in Treatment: 65 Active Inactive ` Abuse / Safety / Falls / Self Care Management Nursing Diagnoses: Impaired physical mobility Potential for falls Goals: Patient will remain injury free Date Initiated: 12/17/2015 Target Resolution Date: 08/29/2016 Goal Status: Active Interventions: Assess fall risk on admission and as needed Notes: ` Necrotic Tissue Nursing Diagnoses: Impaired tissue integrity related to necrotic/devitalized tissue Knowledge  deficit related to management of necrotic/devitalized tissue Goals: Necrotic/devitalized tissue will be minimized in the wound bed Date Initiated: 02/04/2016 Target Resolution Date: 08/29/2016 Goal Status: Active Interventions: Assess patient pain level pre-, during and post procedure and prior to discharge Provide education on necrotic tissue and debridement process Treatment Activities: Apply topical anesthetic as ordered : 02/04/2016 TAYLIN, MANS ([id number]) Notes: ` Wound/Skin Impairment Nursing Diagnoses: Impaired tissue integrity Goals: Patient/caregiver will verbalize understanding of skin care regimen Date Initiated: 12/17/2015 Target Resolution Date: 08/29/2016 Goal Status: Active Ulcer/skin breakdown will have a volume reduction of 30% by week 4 Date Initiated: 12/17/2015 Target Resolution Date: 08/29/2016 Goal Status: Active Ulcer/skin breakdown will have a volume reduction of 50% by week 8 Date Initiated: 12/17/2015 Target Resolution Date: 08/29/2016 Goal Status: Active Ulcer/skin breakdown will have a volume reduction of 80% by week 12 Date Initiated: 12/17/2015 Target Resolution Date: 08/29/2016 Goal Status: Active Ulcer/skin breakdown will heal within 14 weeks Date Initiated: 12/17/2015 Target Resolution Date: 08/29/2016 Goal Status: Active Interventions: Assess patient/caregiver ability to obtain necessary supplies Assess patient/caregiver ability to perform ulcer/skin care regimen upon admission and as needed Assess ulceration(s) every visit Notes: Electronic Signature(s) Signed: 08/19/2016 4:46:19 PM By: Alejandro Mulling Entered By: Alejandro Mulling on 08/19/2016 09:04:41 [name] ([id number]) -------------------------------------------------------------------------------- Pain Assessment Details  Patient Name: Natalie RimaHOPKINS, Renesmee Y. Date of Service: 08/19/2016 8:00 AM Medical Record Patient Account Number: [id number] Number: Treating RN:  Phillis Haggisinkerton, Debi 31-Mar-1934 (82 y.o. Other Clinician: Date of Birth/Sex: Female) Treating ROBSON, MICHAEL Primary Care Zyra Parrillo: Rolm GalaGrandis, Heidi Robbyn Hodkinson/Extender: G Referring Joniel Graumann: Charolotte CapuchinGrandis, Heidi Weeks in Treatment: 1535 Active Problems Location of Pain Severity and Description of Pain Patient Has Paino No Site Locations With Dressing Change: No Pain Management and Medication Current Pain Management: Electronic Signature(s) Signed: 08/19/2016 4:46:19 PM By: Alejandro MullingPinkerton, Debra Entered By: Alejandro MullingPinkerton, Debra on 08/19/2016 08:12:16 Natalie RimaHOPKINS, Reita Y. ([id number]) -------------------------------------------------------------------------------- Patient/Caregiver Education Details Patient Name: Natalie RimaHOPKINS, Endya Y. Date of Service: 08/19/2016 8:00 AM Medical Record Patient Account Number: [id number] Number: Treating RN: Phillis Haggisinkerton, Debi 31-Mar-1934 (82 y.o. Other Clinician: Date of Birth/Gender: Female) Treating ROBSON, MICHAEL Primary Care Physician: Rolm GalaGrandis, Heidi Physician/Extender: G Referring Physician: Charolotte CapuchinGrandis, Heidi Weeks in Treatment: 4235 Education Assessment Education Provided To: Patient Education Topics Provided Wound/Skin Impairment: Handouts: Other: change dressing as ordered Methods: Demonstration, Explain/Verbal Responses: State content correctly Electronic Signature(s) Signed: 08/19/2016 4:46:19 PM By: Alejandro MullingPinkerton, Debra Entered By: Alejandro MullingPinkerton, Debra on 08/19/2016 09:07:39 Natalie RimaHOPKINS, Samarra Y. ([id number]) -------------------------------------------------------------------------------- Wound Assessment Details Patient Name: Natalie RimaHOPKINS, Enyla Y. Date of Service: 08/19/2016 8:00 AM Medical Record Patient Account Number: [id number] Number: Treating RN: Phillis Haggisinkerton, Debi 31-Mar-1934 (82 y.o. Other Clinician: Date of Birth/Sex: Female) Treating ROBSON, MICHAEL Primary Care Ryleigh Buenger: Rolm GalaGrandis, Heidi Michaella Imai/Extender: G Referring Makalia Bare: Charolotte CapuchinGrandis, Heidi Weeks in  Treatment: 35 Wound Status Wound Number: 9 Primary Diabetic Wound/Ulcer of the Lower Etiology: Extremity Wound Location: Right Achilles Secondary Arterial Insufficiency Ulcer Wounding Event: Gradually Appeared Etiology: Date Acquired: 06/02/2015 Wound Open Weeks Of Treatment: 35 Status: Clustered Wound: No Comorbid Arrhythmia, Congestive Heart Pending Amputation On Presentation History: Failure, Hypertension, Type II Diabetes Photos Photo Uploaded By: Alejandro MullingPinkerton, Debra on 08/19/2016 11:44:22 Wound Measurements Length: (cm) 0.1 Width: (cm) 0.1 Depth: (cm) 0.1 Area: (cm) 0.008 Volume: (cm) 0.001 % Reduction in Area: 100% % Reduction in Volume: 100% Epithelialization: None Tunneling: No Undermining: No Wound Description Classification: Grade 2 Foul Odor Afte Wound Margin: Distinct, outline attached Slough/Fibrino Exudate Amount: None Present r Cleansing: No Yes Wound Bed Granulation Amount: None Present (0%) Necrotic Amount: Large (67-100%) Natalie RimaHOPKINS, Merrick Y. ([id number]) Necrotic Quality: Eschar Periwound Skin Texture Texture Color No Abnormalities Noted: No No Abnormalities Noted: No Moisture Temperature / Pain No Abnormalities Noted: No Temperature: No Abnormality Tenderness on Palpation: Yes Wound Preparation Ulcer Cleansing: Rinsed/Irrigated with Saline, Other: soap and water, Topical Anesthetic Applied: Other: lidocaine 4%, Treatment Notes Wound #9 (Right Achilles) 1. Cleansed with: Clean wound with Normal Saline 2. Anesthetic Topical Lidocaine 4% cream to wound bed prior to debridement 4. Dressing Applied: Prisma Ag 5. Secondary Dressing Applied ABD Pad Dry Gauze Kerlix/Conform 7. Secured with Tape Notes kerlix, coban Electronic Signature(s) Signed: 08/19/2016 4:46:19 PM By: Alejandro MullingPinkerton, Debra Entered By: Alejandro MullingPinkerton, Debra on 08/19/2016 08:21:10 Natalie RimaHOPKINS, Iveliz Y.  ([id number]) -------------------------------------------------------------------------------- Vitals Details Patient Name: Natalie RimaHOPKINS, Wiley Y. Date of Service: 08/19/2016 8:00 AM Medical Record Patient Account Number: [id number] Number: Treating RN: Phillis Haggisinkerton, Debi 31-Mar-1934 (82 y.o. Other Clinician: Date of Birth/Sex: Female) Treating ROBSON, MICHAEL Primary Care Deyonte Cadden: Rolm GalaGrandis, Heidi Yianna Tersigni/Extender: G Referring Urvi Imes: Charolotte CapuchinGrandis, Heidi Weeks in Treatment: 35 Vital Signs Time Taken: 08:12 Temperature (F): 97.5 Height (in): 65 Pulse (bpm): 103 Weight (lbs): 160 Respiratory Rate (breaths/min): 16 Body Mass Index (BMI): 26.6 Blood Pressure (mmHg): 105/48 Reference Range: 80 - 120 mg / dl Electronic Signature(s) Signed: 08/19/2016 4:46:19 PM By: Alejandro MullingPinkerton, Debra Entered  By: Alejandro Mulling on 08/19/2016 08:14:40

## 2016-08-20 NOTE — Progress Notes (Signed)
[name], [name] ([id number]) Visit Report for 08/19/2016 Chief Complaint Document Details Patient Name: [name], [name]. Date of Service: 08/19/2016 8:00 AM Medical Record Patient Account Number: [id number] Number: Treating RN: Phillis Haggis [birth year] (82 [name] Other Clinician: Date of Birth/Sex: Female) Treating Jeyden Coffelt Primary Care Provider: [name] Provider/Extender: G Referring Provider: Charolotte Capuchin in Treatment: 35 Information Obtained from: Patient Chief Complaint Ms. Bartunek presents today for follow-up evaluation of her diabetic foot ulcers and abdominal wound. Electronic Signature(s) Signed: 08/19/2016 4:37:19 PM By: Baltazar Najjar MD Entered By: Baltazar Najjar on 08/19/2016 08:41:49 [name] ([id number]) -------------------------------------------------------------------------------- Debridement Details Patient Name: [name] Date of Service: 08/19/2016 8:00 AM Medical Record Patient Account Number: [id number] Number: Treating RN: Phillis Haggis [birth year] (82 [name] Other Clinician: Date of Birth/Sex: Female) Treating Alesa Echevarria Primary Care Provider: [name] Provider/Extender: G Referring Provider: Charolotte Capuchin in Treatment: 35 Debridement Performed for Wound #9 Right Achilles Assessment: Performed By: Physician Maxwell Caul, MD Debridement: Debridement Pre-procedure Yes - 08:30 Verification/Time Out Taken: Start Time: 08:31 Pain Control: Lidocaine 4% Topical Solution Level: Skin/Subcutaneous Tissue Total Area Debrided (L x 0.1 (cm) x 0.1 (cm) = 0.01 (cm) W): Tissue and other Viable, Non-Viable, Exudate, Fibrin/Slough, Subcutaneous material debrided: Instrument: Blade, Forceps Bleeding: Minimum Hemostasis Achieved: Pressure End Time: 08:34 Procedural Pain: 0 Post Procedural Pain: 0 Response to Treatment: Procedure was tolerated well Post Debridement Measurements  of Total Wound Length: (cm) 2 Width: (cm) 0.5 Depth: (cm) 0.1 Volume: (cm) 0.079 Character of Wound/Ulcer Post Requires Further Debridement Debridement: Severity of Tissue Post Debridement: Fat layer exposed Post Procedure Diagnosis Same as Pre-procedure Electronic Signature(s) Signed: 08/19/2016 4:37:19 PM By: Baltazar Najjar MD Signed: 08/19/2016 4:46:19 PM By: Adair Laundry ([id number]) Entered By: Baltazar Najjar on 08/19/2016 08:41:42 [name] ([id number]) -------------------------------------------------------------------------------- HPI Details Patient Name: [name] Date of Service: 08/19/2016 8:00 AM Medical Record Patient Account Number: [id number] Number: Treating RN: Phillis Haggis 26-Jun-1933 (82 [name] Other Clinician: Date of Birth/Sex: Female) Treating Katey Barrie Primary Care Provider: [name] Provider/Extender: G Referring Provider: Charolotte Capuchin in Treatment: 35 History of Present Illness Location: right posterior heel, right Achilles, right lower quadrant abdomen, right fourth toe amp site Quality: denies pain to any wound Severity: not applicable Timing: denies pain HPI Description: 83 year old patient who is known to be diabetic, was referred to [place] by Dr. Gavin Potters for a right heel ulceration which she's had for a while. She was recently in hospital for a pneumonia and at that time and got delirious and was disoriented and sometime during this time developed a stage II ulcer on her right heel. Her past medical history is significant for bilateral pneumonia which was treated with injectable antibiotics and then to oral Levaquin which he has completed. She also has acute on chronic diastolic CHF, acute on chronic respiratory failure, end-stage renal disease on hemodialysis, atrial fibrillation, recent stroke, diabetes mellitus. The patient and her son are poor historians but from what I  understand she was admitted to the hospital with an acute vascular compromise of her right lower extremity and Dr. Wyn Quaker has done a surgical procedure and we are trying to obtain these notes. There are also some vascular workup done and we will try and obtain these notes. the injury to the left lower quadrant of abdomen and the suprapubic area have been there due to a bruise and have been there for several months and no intervention has been  done. 10/11/2015 -- on review of the electronics records it was noted that the patient was admitted to the hospital on 09/14/2015 with peripheral vascular disease with claudication, end-stage renal disease, pressure ulcer, chronic atrial fibrillation. She was seen by Dr. Wyn Quaker who did her right lower extremity angiogram , angioplasty of the right anterior tibial artery and thrombolysis with TPA of the right popliteal artery, and thrombectomy. She was seen by Dr. Wyn Quaker during this past week and he was pleased with the progress. He did say that if he took her to the operating room for any procedure he would debride the abdominal wound under anesthesia. She was also seen by Dr. Ether Griffins the podiatrist who thought that she may lose her right fourth toe at some stage may need an amputation of this. 10/21/2015 --patient known to Dr. Wyn Quaker and his last office visit from 10/04/2015 has been reviewed. 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 She also had a persistent ulceration of the right heel and markedly this area and a right third and fourth toe and a small scab on the calf but these are dry and seemed to be improving. Patient also has a left carotid endarterectomy and multiple interventions to a right brachiocephalic AV fistula. After the visit he had recommended noninvasive studies to recheck her revascularization. He was off the impression that she  would likely lose the right fourth toe and the third toe was likely to heal. [name] ([id number]) He was concerned about underlying muscle necrosis on her right heel and midfoot. 11/01/2015 -- an echo done in January of this year showed her left ventricular ejection fraction to be about 50-55%. The patient was seen by the PA and Dr. Driscilla Grammes office and the plan was to take her to the operating room soon to have a debridement under anesthesia for the abdominal wall wound, the Achilles tendon on the right leg and amputation of the right fourth toe. The daughter and the patient do not feel that they would be able to undergo hyperbaric oxygen therapy 5 days a week for 6 weeks. 12/17/15; this is a medically complex woman who I note was recently in this clinic however I was not involved with her care. She returns today with multiple wounds; a) she has a wound in the mid abdomen that is been there since March of this year. I note that she is been to the overall for debridement recently. The exact etiology of this wound is not really clear b) left lower quadrant abdominal wound had some sanguinous drainage when she came in here. The patient fell in January and thinks this may have been secondary to a hematoma. c): The patient has 3 wounds on her right leg including a small wound on the right mid calf, a large area over the Achilles which currently has a wound VAC for the last 6 weeks, also a smaller wound on the distal part of the right heel. As far as I understand most of these wounds are currently been dressed with's calcium alginate. According to her daughter the Achilles wound under the wound VAC is doing well d) the patient is had an amputation of her left fifth toe in January and the right fourth toe 6 weeks ago secondary to diabetic PAD e) the patient has chronic renal failure on dialysis for the last 2 years secondary to type 2 diabetes on insulin. The daughter's  knowledge there is been  no biopsy of the abdominal wounds given their current appearance and lack of undefined etiology at have to wonder about calciphylaxis. 12/18/15:Addendum; I have reviewed [facility] I can see no relevant x-rays of the right heel. I note her arteriogram and revascularization of her right lower extremity in April 2017. She had debridement of both abdominal wounds and the right heel and Achilles wound on 11/07/15 at which time she had a right fourth toe ray amputation. The abdominal wounds were debridement again on 6/29. I do not see any relevant pathology of these abdominal wounds 12/24/15; culture I did of the drainage from the midline abdominal wound last week showed both Proteus and ampicillin sensitive enterococcus. I've given her a course of Augmentin adjusted on dialysis days. She has no specific complaints today. Been using Santyl to the abdominal wounds in the right leg wound and the wound VAC on the right Achilles which was initially prescribed by Dr. dew 12/31/15; I have done two punch biopsies of the large midline abdominal. 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 May have been a trauma component of the one on the left lower quadrant however the midline wound had no such history. She has a large area on the right Achilles heel with a wound VAC prescribed by Dr. dew. A small wound on the right anterior leg.Marland Kitchen UNFORTUNATELY she has 2 new wounds today. One on the left heel which is probably a pressure area. As well her previous amputation site of her right fourth toe has dehisced and now has a small wound with significant depth at the amputation site. 01/14/2016 -- she returns after 2 weeks and had had a punch biopsy of abdominal wound done the last visit -- had a biopsy of her midline abdominal wound done and the Pathology diagnosis is that of ulceration, necrosis and inflammation and negative for dysplasia and malignancy. 01/21/16. 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 In the meantime she has new wounds of the left heel the right fourth toe amputation site is opened up. The back is stopped to the right heel area. 01/28/16; the abdominal wounds continued to improve. The extensive wound on her right Achilles also looks stable except for the lower aspect of the wound where there is a large liquefied area that probes right down to her calcaneus. This cultured Proteus last week I have her on Augmentin and doxycycline 1. I think this is [name], [name] ([id number]) going to need a course of IV antibiotics and I will call dialysis. X-ray I did last week was negative, I think she is going to need an MRI 02/04/16; right heel MRI as before Saturday. Receiving I believe IV Rocephin at dialysis 02/11/16; as it turns out the patient could not have a MRI as she has a bladder stimulator in place even though it is not currently in use since the beginning of this year. Although she has an allergy to IV contrast she apparently has done well with premedication so we will have to go for a CT scan with contrast. In the meantime she has had a fall now has a large skin tear on her left upper arm. She went to the ER and they suggested Tegaderm over topical antibiotics 03/03/16 currently patient returns after having been hospitalized for 2 weeks and subsequently transferred to [facility] nursing facility. She actually seems to be doing excellent compared to even when we last saw her according  to our nursing staff. Both patient and her daughter are extremely encouraged at how well she is presenting at this point in time. Overall the biggest issue is still the right Achilles area which is being managed at this point in time by Dr. Wyn Quaker. Patient is currently utilizing a wound VAC to that region. 03/17/16; patient is at [facility] nursing home still. Using Aquacel Ag to the wounds on her bilateral feet and still Prisma to the small open area  on her abdomen. 03/31/16 at this point in time patient has been tolerating the dressing changes currently. She fortunately has no worsening of her symptoms although she tells me that the nurse who is caring for her at [facility] nursing facility decided that nothing was needed in regard to the lower abdominal wound from a dressing standpoint at this time. she is really not having significant discomfort or pain at this point she continues to have some tunneling in the proximal Achilles wound region. 04/07/16 patient continues to do well on evaluation today. Even the Achilles wound which has been the most tender is not giving her as much trouble. Unfortunately the PolyMem dressings that we utilize last week really did not seem to benefit her in particular. Obviously we will discontinue that at this point in time today. 04-15-16:Ms. Spellman is accompanied today by her daughter. She is still residing in an SNF undergoing dialysis, continues to receive antibiotics during dialysis as prescribed by Dr. Sampson Goon of infectious disease. She has a follow-up appointment with Dr. Sampson Goon on 04-17-16 to discuss the continuation of these antibiotics. Ms. Fife denies any pain to any of the 3 remaining wounds she does admit to changing to [employer] surgical shoes for safety while ambulating with physical therapy. She denies any falls since her last appointment here although she and her daughter do admit to increased tremors of unclear etiology since her last appointment. She has tolerated the dressing changes that were prescribed last week. 04-22-16 Ms. Marhefka presents today accompanied by her daughter for evaluation of her diabetic foot ulcers. She is still residing in an SNF, she continues dialysis 3 times weekly. She'll follow up with Dr. Sampson Goon last Friday, and at that appointment IV antibiotics were discontinued. According to Ms. Cieslak and her daughter if there is any regression of her wounds Dr.  Sampson Goon will consider re-starting the antibiotics. Ms. Chargois daughter states that since the discontinuation of antibiotic therapy her "twitches" have resolved. 04/29/16; I have not seen this patient in quite some time however she is completed triple antibiotic therapy given at dialysis for osteomyelitis as prescribed by infectious disease. She is still being followed by Dr. dew of vascular surgery. We are using Hydrofera Blue to the surface of these wounds. She currently has 2 open areas a substantial area over her Achilles area although this is a lot better than the last time I saw this. She also has a small wound superiorly from this wound which has a superior probing depth of 2 cm. 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 My biopsy did not prove this nevertheless I think this is the correct clinical diagnosis. We are now following her for an area on the right Achilles part of her heel. This is not any different from last week. [name], [name] ([id number]) She also  has a small tunneling area just above this. And unfortunately this week there is been a reopening of an area where her right fourth toe was previously amputated. She is completed antibiotics 05/13/16; she has a new reopening on the mid abdominal wound in the same site is previously. This is a small open area. Apligraf #1 today to the areas on the right heel o2 still an open area in the fourth toe amputation site 05/27/16; the areas on her abdomen are completely closed over. Small open area from last time is closed. Apligraf #2 today to the areas on the right heel. The 4th toe amputation site is also heel 06/10/16; we did not have an Apligraf to put on today. This appeared tunneling wound on the right  posterior calf appears to be closed. The more substantial area on her Achilles itself is improved. 06/24/16; I reapplied her third Apligraf today although we have not had one debridement last time. The original tunneling wound is not as closed as last time. The more substantial area on her Achilles itself is improved with advancing epithelialization 07/08/16 Apligraf #4 today. The original long area over her Achilles as a healthy-looking area at the superior aspect and a small divot inferiorly. Both of the wound surface is look healthy 07/22/16; patient arrived today with a 2 original wounds on the Achilles part of her ankle too small for another application of Apligraf. This is on the right Achilles area. The small divot inferiorly looks as though it may have skin over most of this and I can't really see an open area. She did have an erythematous area over the medial malleolus with some drainage this looks like a rapid injury not cellulitis 07/29/16; erythematous area over the medial malleolus seems a lot better this week. This was a wrap injury. The areas on the right Achilles is still a small divot. The area superiorly down somewhat in size. 08/05/16; condition is generally not feeling well. No major changed either wound. Using Prisma 08/19/16; patient was last here she was admitted to hospital last week. Apparently she was complaining mostly of abdominal pain however a CT scan of the abdomen and pelvis revealed changes in the L2-L3 interspace suspicious for discitis and osteomyelitis as well as changes in the adjacent paraspinal and psoas muscles. She was seen by infectious disease Dr. Sampson Goon. Her urine culture grew group B strep. An aspirate of the fluid at L2-L3 I reviewed today. Her AFB Gram stain was negative, fungal culture is still in place. C + S is negative so far as of today. She is on vancomycin and ceftazidine dosed at dialysis for 6 weeks. She is not complaining of fever or back pain  currently. We have been using [employer] and to her wounds Electronic Signature(s) Signed: 08/19/2016 4:37:19 PM By: Baltazar Najjar MD Entered By: Baltazar Najjar on 08/19/2016 08:44:34 [name] ([id number]) -------------------------------------------------------------------------------- Physical Exam Details Patient Name: [name] Date of Service: 08/19/2016 8:00 AM Medical Record Patient Account Number: [id number] Number: Treating RN: Phillis Haggis 1934-05-23 (82 [name] Other Clinician: Date of Birth/Sex: Female) Treating Daneil Beem Primary Care Provider: [name] Provider/Extender: G Referring Provider: Charolotte Capuchin in Treatment: 35 Constitutional Sitting or standing Blood Pressure is within target range for patient.. Pulse regular and within target range for patient.Marland Kitchen Respirations regular, non-labored and within target range.. Temperature is normal and within the target range for the patient.. Patient's appearance is neat and clean. Appears in no acute distress. Well nourished  and well developed.. Cardiovascular Pedal pulses palpable and strong bilaterally.. Notes Wound exam; the area on the Achilles had a large surface eschar over the top of it. I removed this. She has a single wound character of which is different from last time. The smaller divot inferiorly appears to be up to the surface and the 2 wounds appear to connect. She is generally in better visual shape than she was the last time. Healthy granulation appears over most of this there is no evidence of surrounding infection Electronic Signature(s) Signed: 08/19/2016 4:37:19 PM By: Baltazar Najjar MD Entered By: Baltazar Najjar on 08/19/2016 08:46:21 [name] ([id number]) -------------------------------------------------------------------------------- Physician Orders Details Patient Name: [name] Date of Service: 08/19/2016 8:00 AM Medical Record  Patient Account Number: [id number] Number: Treating RN: Phillis Haggis 1934-03-24 (82 [name] Other Clinician: Date of Birth/Sex: Female) Treating Jeri Rawlins Primary Care Provider: [name] Provider/Extender: G Referring Provider: Charolotte Capuchin in Treatment: 45 Verbal / Phone Orders: Yes Clinician: Ashok Cordia, Debi Read Back and Verified: Yes Diagnosis Coding Wound Cleansing Wound #9 Right Achilles o Clean wound with Normal Saline. o Cleanse wound with mild soap and water o May shower with protection. Anesthetic Wound #9 Right Achilles o Topical Lidocaine 4% cream applied to wound bed prior to debridement - all wounds for clinic use only Primary Wound Dressing Wound #9 Right Achilles o Prisma Ag - moisten with saline Secondary Dressing Wound #9 Right Achilles o ABD pad o Dry Gauze Dressing Change Frequency Wound #9 Right Achilles o Change dressing every other day. Follow-up Appointments Wound #9 Right Achilles o Return Appointment in 1 week. Edema Control Wound #9 Right Achilles o Kerlix and Coban - Right Lower Extremity - do not wrap too tight and not the whole leg o Elevate legs to the level of the heart and pump ankles as often as possible [name], [name] ([id number]) Off-Loading Wound #9 Right Achilles o Other: - Float heels Electronic Signature(s) Signed: 08/19/2016 4:37:19 PM By: Baltazar Najjar MD Signed: 08/19/2016 4:46:19 PM By: Alejandro Mulling Entered By: Alejandro Mulling on 08/19/2016 08:42:01 [name] ([id number]) -------------------------------------------------------------------------------- Problem List Details Patient Name: [name], [name]. Date of Service: 08/19/2016 8:00 AM Medical Record Patient Account Number: [id number] Number: Treating RN: Phillis Haggis 03-31-1934 (82 [name] Other Clinician: Date of Birth/Sex: Female) Treating Irma Delancey Primary Care Provider:  [name] Provider/Extender: G Referring Provider: Charolotte Capuchin in Treatment: 38 Active Problems ICD-10 Encounter Code Description Active Date Diagnosis E11.621 Type 2 diabetes mellitus with foot ulcer 12/17/2015 Yes L97.512 Non-pressure chronic ulcer of other part of right foot with 03/31/2016 Yes fat layer exposed E11.51 Type 2 diabetes mellitus with diabetic peripheral 12/17/2015 Yes angiopathy without gangrene Inactive Problems ICD-10 Code Description Active Date Inactive Date M86.671 Other chronic osteomyelitis, right ankle and foot 04/15/2016 04/15/2016 S31.104A Unspecified open wound of abdominal wall, left lower 12/17/2015 12/17/2015 quadrant without penetration into peritoneal cavity, initial encounter S31.103D Unspecified open wound of abdominal wall, right lower 04/15/2016 04/15/2016 quadrant without penetration into peritoneal cavity, subsequent encounter Resolved Problems Electronic Signature(s) [name], [name] ([id number]) Signed: 08/19/2016 4:37:19 PM By: Baltazar Najjar MD Entered By: Baltazar Najjar on 08/19/2016 08:41:24 [name] ([id number]) -------------------------------------------------------------------------------- Progress Note Details Patient Name: [name] Date of Service: 08/19/2016 8:00 AM Medical Record Patient Account Number: [id number] Number: Treating RN: Phillis Haggis 08/31/1933 (82 [name] Other Clinician: Date of Birth/Sex: Female) Treating Elson Ulbrich Primary Care Provider: [name] Provider/Extender: G Referring Provider: [name]  Weeks in Treatment: 35 Subjective Chief Complaint Information obtained from Patient Ms. Orth presents today for follow-up evaluation of her diabetic foot ulcers and abdominal wound. History of Present Illness (HPI) The following HPI elements were documented for the patient's wound: Location: right posterior heel, right Achilles, right lower quadrant  abdomen, right fourth toe amp site Quality: denies pain to any wound Severity: not applicable Timing: denies pain 83 year old patient who is known to be diabetic, was referred to [place] by Dr. Gavin Potters for a right heel ulceration which she's had for a while. She was recently in hospital for a pneumonia and at that time and got delirious and was disoriented and sometime during this time developed a stage II ulcer on her right heel. Her past medical history is significant for bilateral pneumonia which was treated with injectable antibiotics and then to oral Levaquin which he has completed. She also has acute on chronic diastolic CHF, acute on chronic respiratory failure, end-stage renal disease on hemodialysis, atrial fibrillation, recent stroke, diabetes mellitus. The patient and her son are poor historians but from what I understand she was admitted to the hospital with an acute vascular compromise of her right lower extremity and Dr. Wyn Quaker has done a surgical procedure and we are trying to obtain these notes. There are also some vascular workup done and we will try and obtain these notes. the injury to the left lower quadrant of abdomen and the suprapubic area have been there due to a bruise and have been there for several months and no intervention has been done. 10/11/2015 -- on review of the electronics records it was noted that the patient was admitted to the hospital on 09/14/2015 with peripheral vascular disease with claudication, end-stage renal disease, pressure ulcer, chronic atrial fibrillation. She was seen by Dr. Wyn Quaker who did her right lower extremity angiogram , angioplasty of the right anterior tibial artery and thrombolysis with TPA of the right popliteal artery, and thrombectomy. She was seen by Dr. Wyn Quaker during this past week and he was pleased with the progress. He did say that if he took her to the operating room for any procedure he would debride the abdominal wound under  anesthesia. She was also seen by Dr. Ether Griffins the podiatrist who thought that she may lose her right fourth toe at some stage may need an amputation of this. [name], [name] ([id number]) 10/21/2015 --patient known to Dr. Wyn Quaker and his last office visit from 10/04/2015 has been reviewed. 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 She also had a persistent ulceration of the right heel and markedly this area and a right third and fourth toe and a small scab on the calf but these are dry and seemed to be improving. Patient also has a left carotid endarterectomy and multiple interventions to a right brachiocephalic AV fistula. After the visit he had recommended noninvasive studies to recheck her revascularization. He was off the impression that she would likely lose the right fourth toe and the third toe was likely to heal. He was concerned about underlying muscle necrosis on her right heel and midfoot. 11/01/2015 -- an echo done in January of this year showed her left ventricular ejection fraction to be about 50-55%. The patient was seen by the PA and Dr. Driscilla Grammes office and the plan was to take her to the operating room soon to have a debridement under anesthesia for the abdominal  wall wound, the Achilles tendon on the right leg and amputation of the right fourth toe. The daughter and the patient do not feel that they would be able to undergo hyperbaric oxygen therapy 5 days a week for 6 weeks. 12/17/15; this is a medically complex woman who I note was recently in this clinic however I was not involved with her care. She returns today with multiple wounds; a) she has a wound in the mid abdomen that is been there since March of this year. I note that she is been to the overall for debridement recently. The exact etiology of this wound is not really clear b) left lower quadrant  abdominal wound had some sanguinous drainage when she came in here. The patient fell in January and thinks this may have been secondary to a hematoma. c): The patient has 3 wounds on her right leg including a small wound on the right mid calf, a large area over the Achilles which currently has a wound VAC for the last 6 weeks, also a smaller wound on the distal part of the right heel. As far as I understand most of these wounds are currently been dressed with's calcium alginate. According to her daughter the Achilles wound under the wound VAC is doing well d) the patient is had an amputation of her left fifth toe in January and the right fourth toe 6 weeks ago secondary to diabetic PAD e) the patient has chronic renal failure on dialysis for the last 2 years secondary to type 2 diabetes on insulin. The daughter's knowledge there is been no biopsy of the abdominal wounds given their current appearance and lack of undefined etiology at have to wonder about calciphylaxis. 12/18/15:Addendum; I have reviewed [facility] I can see no relevant x-rays of the right heel. I note her arteriogram and revascularization of her right lower extremity in April 2017. She had debridement of both abdominal wounds and the right heel and Achilles wound on 11/07/15 at which time she had a right fourth toe ray amputation. The abdominal wounds were debridement again on 6/29. I do not see any relevant pathology of these abdominal wounds 12/24/15; culture I did of the drainage from the midline abdominal wound last week showed both Proteus and ampicillin sensitive enterococcus. I've given her a course of Augmentin adjusted on dialysis days. She has no specific complaints today. Been using Santyl to the abdominal wounds in the right leg wound and the wound VAC on the right Achilles which was initially prescribed by Dr. dew 12/31/15; I have done two punch biopsies of the large midline abdominal. 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 May have been a trauma component of the one on the left lower quadrant however the midline wound had no such history. She has a large area on the right Achilles heel with a wound VAC prescribed by Dr. dew. A small wound on the right anterior leg.Marland Kitchen UNFORTUNATELY she has 2 new wounds today. One on the left heel which is probably a pressure area. As well her previous amputation site of her right fourth toe has dehisced and now has a small wound with significant depth at the amputation site. 01/14/2016 -- she returns after 2 weeks and had had a punch biopsy of abdominal wound done the last visit [name], [name] ([id number]) -- had a biopsy of her midline abdominal wound done and the Pathology diagnosis is that of ulceration, necrosis and inflammation and negative for dysplasia and malignancy. 01/21/16. 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 In the meantime she has new wounds of the left heel the right fourth toe amputation site is opened up. The back is stopped to the right heel area. 01/28/16; the abdominal wounds continued to improve. The extensive wound on her right Achilles also looks stable except for the lower aspect of the wound where there is a large liquefied area that probes right down to her calcaneus. This cultured Proteus last week I have her on Augmentin and doxycycline 1. I think this is going to need a course of IV antibiotics and I will call dialysis. X-ray I did last week was negative, I think she is going to need an MRI 02/04/16; right heel MRI as before Saturday. Receiving I believe IV Rocephin at dialysis 02/11/16; as it turns out the patient could not have a MRI as she has a bladder stimulator in place even though it is not currently in use since the beginning of this year. Although she has an allergy to IV contrast she apparently has done well with premedication so we will have to go for a  CT scan with contrast. In the meantime she has had a fall now has a large skin tear on her left upper arm. She went to the ER and they suggested Tegaderm over topical antibiotics 03/03/16 currently patient returns after having been hospitalized for 2 weeks and subsequently transferred to [facility] nursing facility. She actually seems to be doing excellent compared to even when we last saw her according to our nursing staff. Both patient and her daughter are extremely encouraged at how well she is presenting at this point in time. Overall the biggest issue is still the right Achilles area which is being managed at this point in time by Dr. Wyn Quaker. Patient is currently utilizing a wound VAC to that region. 03/17/16; patient is at [facility] nursing home still. Using Aquacel Ag to the wounds on her bilateral feet and still Prisma to the small open area on her abdomen. 03/31/16 at this point in time patient has been tolerating the dressing changes currently. She fortunately has no worsening of her symptoms although she tells me that the nurse who is caring for her at [facility] nursing facility decided that nothing was needed in regard to the lower abdominal wound from a dressing standpoint at this time. she is really not having significant discomfort or pain at this point she continues to have some tunneling in the proximal Achilles wound region. 04/07/16 patient continues to do well on evaluation today. Even the Achilles wound which has been the most tender is not giving her as much trouble. Unfortunately the PolyMem dressings that we utilize last week really did not seem to benefit her in particular. Obviously we will discontinue that at this point in time today. 04-15-16:Ms. Mckendree is accompanied today by her daughter. She is still residing in an SNF undergoing dialysis, continues to receive antibiotics during dialysis as prescribed by Dr. Sampson Goon of infectious disease. She has a follow-up appointment  with Dr. Sampson Goon on 04-17-16 to discuss the continuation of these antibiotics. Ms. Gholson denies any pain to any of the 3 remaining wounds she does admit to changing to [employer] surgical shoes for safety while ambulating with physical therapy. She denies any falls since her last appointment here although she and her daughter do admit to increased tremors of unclear etiology since her last appointment. She has tolerated the dressing changes that were  prescribed last week. 04-22-16 Ms. Hannum presents today accompanied by her daughter for evaluation of her diabetic foot ulcers. She is still residing in an SNF, she continues dialysis 3 times weekly. She'll follow up with Dr. Sampson Goon last Friday, and at that appointment IV antibiotics were discontinued. According to Ms. Guadarrama and her daughter if there is any regression of her wounds Dr. Sampson Goon will consider re-starting the antibiotics. Ms. Plantz daughter states that since the discontinuation of antibiotic therapy her "twitches" have resolved. 04/29/16; I have not seen this patient in quite some time however she is completed triple antibiotic therapy given at dialysis for osteomyelitis as prescribed by infectious disease. She is still being followed by Dr. Wyn Quaker [name] ([id number]) of vascular surgery. We are using Hydrofera Blue to the surface of these wounds. She currently has 2 open areas a substantial area over her Achilles area although this is a lot better than the last time I saw this. She also has a small wound superiorly from this wound which has a superior probing depth of 2 cm. 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 My biopsy did not prove this nevertheless I think this is the correct clinical  diagnosis. We are now following her for an area on the right Achilles part of her heel. This is not any different from last week. She also has a small tunneling area just above this. And unfortunately this week there is been a reopening of an area where her right fourth toe was previously amputated. She is completed antibiotics 05/13/16; she has a new reopening on the mid abdominal wound in the same site is previously. This is a small open area. Apligraf #1 today to the areas on the right heel o2 still an open area in the fourth toe amputation site 05/27/16; the areas on her abdomen are completely closed over. Small open area from last time is closed. Apligraf #2 today to the areas on the right heel. The 4th toe amputation site is also heel 06/10/16; we did not have an Apligraf to put on today. This appeared tunneling wound on the right posterior calf appears to be closed. The more substantial area on her Achilles itself is improved. 06/24/16; I reapplied her third Apligraf today although we have not had one debridement last time. The original tunneling wound is not as closed as last time. The more substantial area on her Achilles itself is improved with advancing epithelialization 07/08/16 Apligraf #4 today. The original long area over her Achilles as a healthy-looking area at the superior aspect and a small divot inferiorly. Both of the wound surface is look healthy 07/22/16; patient arrived today with a 2 original wounds on the Achilles part of her ankle too small for another application of Apligraf. This is on the right Achilles area. The small divot inferiorly looks as though it may have skin over most of this and I can't really see an open area. She did have an erythematous area over the medial malleolus with some drainage this looks like a rapid injury not cellulitis 07/29/16; erythematous area over the medial malleolus seems a lot better this week. This was a wrap injury. The areas on the  right Achilles is still a small divot. The area superiorly down somewhat in size. 08/05/16; condition is  generally not feeling well. No major changed either wound. Using Prisma 08/19/16; patient was last here she was admitted to hospital last week. Apparently she was complaining mostly of abdominal pain however a CT scan of the abdomen and pelvis revealed changes in the L2-L3 interspace suspicious for discitis and osteomyelitis as well as changes in the adjacent paraspinal and psoas muscles. She was seen by infectious disease Dr. Sampson GoonFitzgerald. Her urine culture grew group B strep. An aspirate of the fluid at L2-L3 I reviewed today. Her AFB Gram stain was negative, fungal culture is still in place. C + S is negative so far as of today. She is on vancomycin and ceftazidine dosed at dialysis for 6 weeks. She is not complaining of fever or back pain currently. We have been using [employer] and to her wounds Objective Constitutional Sitting or standing Blood Pressure is within target range for patient.. Pulse regular and within target range for patient.Marland Kitchen. Respirations regular, non-labored and within target range.. Temperature is normal and within the target range for the patient.. Patient's appearance is neat and clean. Appears in no acute distress. Well nourished and well developed.Marland Kitchen. Natalie RimaHOPKINS, Natalie Y. ([id number]) Vitals Time Taken: 8:12 AM, Height: 65 in, Weight: 160 lbs, BMI: 26.6, Temperature: 97.5 F, Pulse: 103 bpm, Respiratory Rate: 16 breaths/min, Blood Pressure: 105/48 mmHg. Cardiovascular Pedal pulses palpable and strong bilaterally.. General Notes: Wound exam; the area on the Achilles had a large surface eschar over the top of it. I removed this. She has a single wound character of which is different from last time. The smaller divot inferiorly appears to be up to the surface and the 2 wounds appear to connect. She is generally in better visual shape than she was the last time. Healthy  granulation appears over most of this there is no evidence of surrounding infection Integumentary (Hair, Skin) Wound #9 status is Open. Original cause of wound was Gradually Appeared. The wound is located on the Right Achilles. The wound measures 0.1cm length x 0.1cm width x 0.1cm depth; 0.008cm^2 area and 0.001cm^3 volume. There is no tunneling or undermining noted. There is a none present amount of drainage noted. The wound margin is distinct with the outline attached to the wound base. There is no granulation within the wound bed. There is a large (67-100%) amount of necrotic tissue within the wound bed including Eschar. Periwound temperature was noted as No Abnormality. The periwound has tenderness on palpation. 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 There was a Skin/Subcutaneous Tissue Debridement ([id number]) debridement with total area of 0.01 sq cm performed by Maxwell CaulOBSON, Malaya Cagley G, MD. with the following instrument(s): Blade and Forceps to remove Viable and Non-Viable tissue/material including Exudate, Fibrin/Slough, and Subcutaneous after achieving pain control using Lidocaine 4% Topical Solution. A time out was conducted at 08:30, prior to the start of the procedure. A Minimum amount of bleeding was controlled with Pressure. The procedure was tolerated Natalie RimaHOPKINS, Minie Y. ([id number]) well with a pain level of 0 throughout and a pain level of 0 following the procedure. Post Debridement Measurements: 2cm length x 0.5cm width x 0.1cm depth; 0.079cm^3 volume. Character of Wound/Ulcer Post Debridement requires further debridement. Severity of Tissue  Post  Debridement is: Fat layer exposed. Post procedure Diagnosis Wound #9: Same as Pre-Procedure Plan Wound Cleansing: Wound #9 Right Achilles: Clean wound with Normal Saline. Cleanse wound with mild soap and water May shower with protection. Anesthetic: Wound #9 Right Achilles: Topical Lidocaine 4% cream applied to wound bed prior to debridement - all wounds for clinic use only Primary Wound Dressing: Wound #9 Right Achilles: Prisma Ag - moisten with saline Secondary Dressing: Wound #9 Right Achilles: ABD pad Dry Gauze Dressing Change Frequency: Wound #9 Right Achilles: Change dressing every other day. Follow-up Appointments: Wound #9 Right Achilles: Return Appointment in 1 week. Edema Control: Wound #9 Right Achilles: Kerlix and Coban - Right Lower Extremity - do not wrap too tight and not the whole leg Elevate legs to the level of the heart and pump ankles as often as possible Off-Loading: Wound #9 Right Achilles: Other: - Float heels #1 right Achilles I think has joined the 2 previous wounds. The inferior part of this which was a divot last time appears to be more superficial. Everything about this looks reasonably healthy. AHRIANA, GUNKEL ([id number]) #2 I still think she would benefit from continuing with [employer]. The patient is now at [facility] although she looks frail she does not look systemically ill. There is nothing about her wound thick currently looks infected. Electronic Signature(s) Signed: 08/19/2016 4:37:19 PM By: Baltazar Najjar MD Entered By: Baltazar Najjar on 08/19/2016 08:48:20 [name] ([id number]) -------------------------------------------------------------------------------- SuperBill Details Patient Name: [name] Date of Service: 08/19/2016 Medical Record Patient Account Number: [id number] Number: Treating RN: Phillis Haggis 14-Aug-1933 (82 [name] Other Clinician: Date of Birth/Sex: Female)  Treating Kinsler Soeder Primary Care Provider: [name] Provider/Extender: G Referring Provider: [name] Service Line: Outpatient Weeks in Treatment: 35 Diagnosis Coding ICD-10 Codes Code Description E11.621 Type 2 diabetes mellitus with foot ulcer L97.512 Non-pressure chronic ulcer of other part of right foot with fat layer exposed E11.51 Type 2 diabetes mellitus with diabetic peripheral angiopathy without gangrene Facility Procedures CPT4 Code Description: [id number] 11042 - DEB SUBQ TISSUE 20 SQ CM/< ICD-10 Description Diagnosis L97.512 Non-pressure chronic ulcer of other part of right fo E11.621 Type 2 diabetes mellitus with foot ulcer Modifier: ot with fat la Quantity: 1 yer exposed Physician Procedures CPT4 Code Description: [id number] 11042 - WC PHYS SUBQ TISS 20 SQ CM ICD-10 Description Diagnosis L97.512 Non-pressure chronic ulcer of other part of right fo E11.621 Type 2 diabetes mellitus with foot ulcer Modifier: ot with fat lay Quantity: 1 er exposed Electronic Signature(s) Signed: 08/19/2016 4:37:19 PM By: Baltazar Najjar MD Entered By: Baltazar Najjar on 08/19/2016 08:48:42

## 2016-08-25 ENCOUNTER — Ambulatory Visit (INDEPENDENT_AMBULATORY_CARE_PROVIDER_SITE_OTHER): Payer: Medicare Other | Admitting: Vascular Surgery

## 2016-08-25 ENCOUNTER — Encounter (INDEPENDENT_AMBULATORY_CARE_PROVIDER_SITE_OTHER): Payer: Medicare Other

## 2016-08-26 ENCOUNTER — Encounter: Payer: No Typology Code available for payment source | Admitting: Internal Medicine

## 2016-08-26 DIAGNOSIS — E11621 Type 2 diabetes mellitus with foot ulcer: Secondary | ICD-10-CM | POA: Diagnosis not present

## 2016-08-28 NOTE — Progress Notes (Signed)
ORIANA, [name] ([id number]) Visit Report for 08/26/2016 Chief Complaint Document Details Patient Name: [name], [name]. Date of Service: 08/26/2016 8:00 AM Medical Record Patient Account Number: [id number] Number: Treating RN: Phillis Haggis 01-18-1934 (83 y.o. Other Clinician: Date of Birth/Sex: Female) Treating Shalee Paolo Primary Care Provider: [name] Provider/Extender: G Referring Provider: Charolotte Capuchin in Treatment: 66 Information Obtained from: Patient Chief Complaint Ms. Podgorski presents today for follow-up evaluation of her diabetic foot ulcers and abdominal wound. Electronic Signature(s) Signed: 08/27/2016 5:50:35 AM By: Baltazar Najjar MD Entered By: Baltazar Najjar on 08/26/2016 08:42:12 [name] ([id number]) -------------------------------------------------------------------------------- HPI Details Patient Name: [name], [name]. Date of Service: 08/26/2016 8:00 AM Medical Record Patient Account Number: [id number] Number: Treating RN: Phillis Haggis 1934-04-29 (83 y.o. Other Clinician: Date of Birth/Sex: Female) Treating Deyci Gesell Primary Care Provider: [name] Provider/Extender: G Referring Provider: Charolotte Capuchin in Treatment: 56 History of Present Illness Location: right posterior heel, right Achilles, right lower quadrant abdomen, right fourth toe amp site Quality: denies pain to any wound Severity: not applicable Timing: denies pain HPI Description: 83 year old patient who is known to be diabetic, was referred to [place] by Dr. Gavin Potters for a right heel ulceration which she's had for a while. She was recently in hospital for a pneumonia and at that time and got delirious and was disoriented and sometime during this time developed a stage II ulcer on her right heel. Her past medical history is significant for bilateral pneumonia which was treated with injectable antibiotics and then to oral  Levaquin which he has completed. She also has acute on chronic diastolic CHF, acute on chronic respiratory failure, end-stage renal disease on hemodialysis, atrial fibrillation, recent stroke, diabetes mellitus. The patient and her son are poor historians but from what I understand she was admitted to the hospital with an acute vascular compromise of her right lower extremity and Dr. Wyn Quaker has done a surgical procedure and we are trying to obtain these notes. There are also some vascular workup done and we will try and obtain these notes. the injury to the left lower quadrant of abdomen and the suprapubic area have been there due to a bruise and have been there for several months and no intervention has been done. 10/11/2015 -- on review of the electronics records it was noted that the patient was admitted to the hospital on 09/14/2015 with peripheral vascular disease with claudication, end-stage renal disease, pressure ulcer, chronic atrial fibrillation. She was seen by Dr. Wyn Quaker who did her right lower extremity angiogram , angioplasty of the right anterior tibial artery and thrombolysis with TPA of the right popliteal artery, and thrombectomy. She was seen by Dr. Wyn Quaker during this past week and he was pleased with the progress. He did say that if he took her to the operating room for any procedure he would debride the abdominal wound under anesthesia. She was also seen by Dr. Ether Griffins the podiatrist who thought that she may lose her right fourth toe at some stage may need an amputation of this. 10/21/2015 --patient known to Dr. Wyn Quaker and his last office visit from 10/04/2015 has been reviewed. 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 She also had a persistent ulceration of the right heel and markedly this area and a right third and fourth toe and a small scab on the calf but these  are dry and seemed  to be improving. Patient also has a left carotid endarterectomy and multiple interventions to a right brachiocephalic AV fistula. After the visit he had recommended noninvasive studies to recheck her revascularization. He was off the impression that she would likely lose the right fourth toe and the third toe was likely to heal. [name] ([id number]) He was concerned about underlying muscle necrosis on her right heel and midfoot. 11/01/2015 -- an echo done in January of this year showed her left ventricular ejection fraction to be about 50-55%. The patient was seen by the PA and Dr. Driscilla Grammes office and the plan was to take her to the operating room soon to have a debridement under anesthesia for the abdominal wall wound, the Achilles tendon on the right leg and amputation of the right fourth toe. The daughter and the patient do not feel that they would be able to undergo hyperbaric oxygen therapy 5 days a week for 6 weeks. 12/17/15; this is a medically complex woman who I note was recently in this clinic however I was not involved with her care. She returns today with multiple wounds; a) she has a wound in the mid abdomen that is been there since March of this year. I note that she is been to the overall for debridement recently. The exact etiology of this wound is not really clear b) left lower quadrant abdominal wound had some sanguinous drainage when she came in here. The patient fell in January and thinks this may have been secondary to a hematoma. c): The patient has 3 wounds on her right leg including a small wound on the right mid calf, a large area over the Achilles which currently has a wound VAC for the last 6 weeks, also a smaller wound on the distal part of the right heel. As far as I understand most of these wounds are currently been dressed with's calcium alginate. According to her daughter the Achilles wound under the wound VAC is doing well d) the  patient is had an amputation of her left fifth toe in January and the right fourth toe 6 weeks ago secondary to diabetic PAD e) the patient has chronic renal failure on dialysis for the last 2 years secondary to type 2 diabetes on insulin. The daughter's knowledge there is been no biopsy of the abdominal wounds given their current appearance and lack of undefined etiology at have to wonder about calciphylaxis. 12/18/15:Addendum; I have reviewed [facility] I can see no relevant x-rays of the right heel. I note her arteriogram and revascularization of her right lower extremity in April 2017. She had debridement of both abdominal wounds and the right heel and Achilles wound on 11/07/15 at which time she had a right fourth toe ray amputation. The abdominal wounds were debridement again on 6/29. I do not see any relevant pathology of these abdominal wounds 12/24/15; culture I did of the drainage from the midline abdominal wound last week showed both Proteus and ampicillin sensitive enterococcus. I've given her a course of Augmentin adjusted on dialysis days. She has no specific complaints today. Been using Santyl to the abdominal wounds in the right leg wound and the wound VAC on the right Achilles which was initially prescribed by Dr. dew 12/31/15; I have done two punch biopsies of the large midline abdominal. 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 May have been a trauma component of the one on the left lower quadrant however the midline wound had no such history. She has a large area on  the right Achilles heel with a wound VAC prescribed by Dr. dew. A small wound on the right anterior leg.Marland Kitchen UNFORTUNATELY she has 2 new wounds today. One on the left heel which is probably a pressure area. As well her previous amputation site of her right fourth toe has dehisced and now has a small wound with significant depth at the amputation site. 01/14/2016 -- she returns after 2 weeks and had had a punch biopsy of  abdominal wound done the last visit -- had a biopsy of her midline abdominal wound done and the Pathology diagnosis is that of ulceration, necrosis and inflammation and negative for dysplasia and malignancy. 01/21/16. 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 In the meantime she has new wounds of the left heel the right fourth toe amputation site is opened up. The back is stopped to the right heel area. 01/28/16; the abdominal wounds continued to improve. The extensive wound on her right Achilles also looks stable except for the lower aspect of the wound where there is a large liquefied area that probes right down to her calcaneus. This cultured Proteus last week I have her on Augmentin and doxycycline 1. I think this is [name], [name] ([id number]) going to need a course of IV antibiotics and I will call dialysis. X-ray I did last week was negative, I think she is going to need an MRI 02/04/16; right heel MRI as before Saturday. Receiving I believe IV Rocephin at dialysis 02/11/16; as it turns out the patient could not have a MRI as she has a bladder stimulator in place even though it is not currently in use since the beginning of this year. Although she has an allergy to IV contrast she apparently has done well with premedication so we will have to go for a CT scan with contrast. In the meantime she has had a fall now has a large skin tear on her left upper arm. She went to the ER and they suggested Tegaderm over topical antibiotics 03/03/16 currently patient returns after having been hospitalized for 2 weeks and subsequently transferred to [facility] nursing facility. She actually seems to be doing excellent compared to even when we last saw her according to our nursing staff. Both patient and her daughter are extremely encouraged at how well she is presenting at this point in time. Overall the biggest issue is still the right  Achilles area which is being managed at this point in time by Dr. Wyn Quaker. Patient is currently utilizing a wound VAC to that region. 03/17/16; patient is at [facility] nursing home still. Using Aquacel Ag to the wounds on her bilateral feet and still Prisma to the small open area on her abdomen. 03/31/16 at this point in time patient has been tolerating the dressing changes currently. She fortunately has no worsening of her symptoms although she tells me that the nurse who is caring for her at [facility] nursing facility decided that nothing was needed in regard to the lower abdominal wound from a dressing standpoint at this time. she is really not having significant discomfort or pain at this point she continues to have some tunneling in the proximal Achilles wound region. 04/07/16 patient continues to do well on evaluation today. Even the Achilles wound which has been the most tender is not giving her as much trouble. Unfortunately the PolyMem dressings that we utilize last week really did not seem to benefit her in particular.  Obviously we will discontinue that at this point in time today. 04-15-16:Ms. Schuneman is accompanied today by her daughter. She is still residing in an SNF undergoing dialysis, continues to receive antibiotics during dialysis as prescribed by Dr. Sampson Goon of infectious disease. She has a follow-up appointment with Dr. Sampson Goon on 04-17-16 to discuss the continuation of these antibiotics. Ms. Dattilio denies any pain to any of the 3 remaining wounds she does admit to changing to [employer] surgical shoes for safety while ambulating with physical therapy. She denies any falls since her last appointment here although she and her daughter do admit to increased tremors of unclear etiology since her last appointment. She has tolerated the dressing changes that were prescribed last week. 04-22-16 Ms. Wickstrom presents today accompanied by her daughter for evaluation of her diabetic  foot ulcers. She is still residing in an SNF, she continues dialysis 3 times weekly. She'll follow up with Dr. Sampson Goon last Friday, and at that appointment IV antibiotics were discontinued. According to Ms. Depolo and her daughter if there is any regression of her wounds Dr. Sampson Goon will consider re-starting the antibiotics. Ms. Hoopingarner daughter states that since the discontinuation of antibiotic therapy her "twitches" have resolved. 04/29/16; I have not seen this patient in quite some time however she is completed triple antibiotic therapy given at dialysis for osteomyelitis as prescribed by infectious disease. She is still being followed by Dr. dew of vascular surgery. We are using Hydrofera Blue to the surface of these wounds. She currently has 2 open areas a substantial area over her Achilles area although this is a lot better than the last time I saw this. She also has a small wound superiorly from this wound which has a superior probing depth of 2 cm. 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 My biopsy did not prove this nevertheless I think this is the correct clinical diagnosis. We are now following her for an area on the right Achilles part of her heel. This is not any different from last week. [name], [name] ([id number]) She also has a small tunneling area just above this. And unfortunately this week there is been a reopening of an area where her right fourth toe was previously amputated. She is completed antibiotics 05/13/16; she has a new reopening on the mid abdominal wound in the same site is previously. This is a small open area. Apligraf #1 today to the areas on the right heel o2 still an open area in the fourth toe amputation site 05/27/16; the  areas on her abdomen are completely closed over. Small open area from last time is closed. Apligraf #2 today to the areas on the right heel. The 4th toe amputation site is also heel 06/10/16; we did not have an Apligraf to put on today. This appeared tunneling wound on the right posterior calf appears to be closed. The more substantial area on her Achilles itself is improved. 06/24/16; I reapplied her third Apligraf today although we have not had one debridement last time. The original tunneling wound is not as closed as last time. The more substantial area on her Achilles itself is improved with advancing epithelialization 07/08/16 Apligraf #4 today. The original long area over her Achilles as a healthy-looking area at the superior aspect and a small divot inferiorly.  Both of the wound surface is look healthy 07/22/16; patient arrived today with a 2 original wounds on the Achilles part of her ankle too small for another application of Apligraf. This is on the right Achilles area. The small divot inferiorly looks as though it may have skin over most of this and I can't really see an open area. She did have an erythematous area over the medial malleolus with some drainage this looks like a rapid injury not cellulitis 07/29/16; erythematous area over the medial malleolus seems a lot better this week. This was a wrap injury. The areas on the right Achilles is still a small divot. The area superiorly down somewhat in size. 08/05/16; condition is generally not feeling well. No major changed either wound. Using Prisma 08/19/16; patient was last here she was admitted to hospital last week. Apparently she was complaining mostly of abdominal pain however a CT scan of the abdomen and pelvis revealed changes in the L2-L3 interspace suspicious for discitis and osteomyelitis as well as changes in the adjacent paraspinal and psoas muscles. She was seen by infectious disease Dr. Sampson Goon. Her urine culture grew group B  strep. An aspirate of the fluid at L2-L3 I reviewed today. Her AFB Gram stain was negative, fungal culture is still in place. C + S is negative so far as of today. She is on vancomycin and ceftazidine dosed at dialysis for 6 weeks. She is not complaining of fever or back pain currently. We have been using [employer] and to her wounds 08/26/16; patient is still receiving IV antibiotics at dialysis. She does not feel well complaining of a lot of pain in the right hip radiating down her leg. She rubs the lateral aspect of the right hip over the greater trochanter. Her wound measurements were a lot of different this week although I don't think they were accurately recorded last week the wound on the right Achilles area looks about the same. Superficial without any depth. Electronic Signature(s) Signed: 08/27/2016 5:50:35 AM By: Baltazar Najjar MD Entered By: Baltazar Najjar on 08/26/2016 08:43:12 [name] ([id number]) -------------------------------------------------------------------------------- Physical Exam Details Patient Name: [name], [name]. Date of Service: 08/26/2016 8:00 AM Medical Record Patient Account Number: [id number] Number: Treating RN: Phillis Haggis 1934-05-26 (82 y.o. Other Clinician: Date of Birth/Sex: Female) Treating Vernie Vinciguerra Primary Care Provider: [name] Provider/Extender: G Referring Provider: Charolotte Capuchin in Treatment: 36 Constitutional Sitting or standing Blood Pressure is within target range for patient.. Pulse regular and within target range for patient.Marland Kitchen Respirations regular, non-labored and within target range.. Temperature is normal and within the target range for the patient.. Patient looks tired pale and gaunt. Eyes Conjunctivae clear. No discharge.Marland Kitchen Respiratory Above normal respiratory effort noted. Respiratory rate elveaated. Fairly clear air entry bilaterally. Cardiovascular Heart rhythm and rate  regular, without murmur or gallop.. Pedal pulses absent bilaterally.. Lymphatic None palpable in the popliteal or inguinal area. Musculoskeletal Right hip exam was normal internal and a sternal rotation was normal. No evidence of a particular right hip problem. Integumentary (Hair, Skin) I carefully inspected her right hip and right buttock for wounds there were none no pressure areas no rashes. Psychiatric Patient appears depressed today.. Notes Wound exam; the area on the Achilles did not require debridement. Surface this looks healthy. A deep area towards the inferior part of the calcaneus/Achilles insertion site is superficial. I see no evidence of infection around the site. There is some surface slough however no debridement was done today Electronic  Signature(s) Signed: 08/27/2016 5:50:35 AM By: Baltazar Najjar MD Entered By: Baltazar Najjar on 08/26/2016 08:45:09 [name] ([id number]) -------------------------------------------------------------------------------- Physician Orders Details Patient Name: [name], [name]. Date of Service: 08/26/2016 8:00 AM Medical Record Patient Account Number: [id number] Number: Treating RN: Huel Coventry 12/01/1933 (82 y.o. Other Clinician: Date of Birth/Sex: Female) Treating Tanylah Schnoebelen Primary Care Provider: [name] Provider/Extender: G Referring Provider: Charolotte Capuchin in Treatment: 7 Verbal / Phone Orders: No Diagnosis Coding Wound Cleansing Wound #9 Right Achilles o Clean wound with Normal Saline. o Cleanse wound with mild soap and water o May shower with protection. Anesthetic Wound #9 Right Achilles o Topical Lidocaine 4% cream applied to wound bed prior to debridement - all wounds for clinic use only Primary Wound Dressing Wound #9 Right Achilles o Prisma Ag - moisten with saline Secondary Dressing Wound #9 Right Achilles o ABD pad o Dry Gauze Dressing Change  Frequency Wound #9 Right Achilles o Change dressing every other day. Follow-up Appointments Wound #9 Right Achilles o Return Appointment in 1 week. Edema Control Wound #9 Right Achilles o Kerlix and Coban - Right Lower Extremity - do not wrap too tight and not the whole leg o Elevate legs to the level of the heart and pump ankles as often as possible [name], [name] ([id number]) Off-Loading Wound #9 Right Achilles o Other: - Float heels Electronic Signature(s) Signed: 08/26/2016 2:38:45 PM By: Elliot Gurney RN, BSN, Kim RN, BSN Signed: 08/27/2016 5:50:35 AM By: Baltazar Najjar MD Entered By: Elliot Gurney, RN, BSN, Kim on 08/26/2016 08:32:19 [name] ([id number]) -------------------------------------------------------------------------------- Problem List Details Patient Name: [name], [name]. Date of Service: 08/26/2016 8:00 AM Medical Record Patient Account Number: [id number] Number: Treating RN: Phillis Haggis September 04, 1933 (82 y.o. Other Clinician: Date of Birth/Sex: Female) Treating Leita Lindbloom Primary Care Provider: [name] Provider/Extender: G Referring Provider: Charolotte Capuchin in Treatment: 36 Active Problems ICD-10 Encounter Code Description Active Date Diagnosis E11.621 Type 2 diabetes mellitus with foot ulcer 12/17/2015 Yes L97.512 Non-pressure chronic ulcer of other part of right foot with 03/31/2016 Yes fat layer exposed E11.51 Type 2 diabetes mellitus with diabetic peripheral 12/17/2015 Yes angiopathy without gangrene Inactive Problems ICD-10 Code Description Active Date Inactive Date M86.671 Other chronic osteomyelitis, right ankle and foot 04/15/2016 04/15/2016 S31.104A Unspecified open wound of abdominal wall, left lower 12/17/2015 12/17/2015 quadrant without penetration into peritoneal cavity, initial encounter S31.103D Unspecified open wound of abdominal wall, right lower 04/15/2016 04/15/2016 quadrant without penetration  into peritoneal cavity, subsequent encounter Resolved Problems Electronic Signature(s) GITTY, OSTERLUND ([id number]) Signed: 08/27/2016 5:50:35 AM By: Baltazar Najjar MD Entered By: Baltazar Najjar on 08/26/2016 08:41:53 [name] ([id number]) -------------------------------------------------------------------------------- Progress Note Details Patient Name: [name] Date of Service: 08/26/2016 8:00 AM Medical Record Patient Account Number: [id number] Number: Treating RN: Phillis Haggis 10-Nov-1933 (82 y.o. Other Clinician: Date of Birth/Sex: Female) Treating Thelia Tanksley Primary Care Provider: [name] Provider/Extender: G Referring Provider: Charolotte Capuchin in Treatment: 33 Subjective Chief Complaint Information obtained from Patient Ms. Kirlin presents today for follow-up evaluation of her diabetic foot ulcers and abdominal wound. History of Present Illness (HPI) The following HPI elements were documented for the patient's wound: Location: right posterior heel, right Achilles, right lower quadrant abdomen, right fourth toe amp site Quality: denies pain to any wound Severity: not applicable Timing: denies pain 83 year old patient who is known to be diabetic, was referred to [place] by Dr. Gavin Potters for a right heel ulceration which she's had for  a while. She was recently in hospital for a pneumonia and at that time and got delirious and was disoriented and sometime during this time developed a stage II ulcer on her right heel. Her past medical history is significant for bilateral pneumonia which was treated with injectable antibiotics and then to oral Levaquin which he has completed. She also has acute on chronic diastolic CHF, acute on chronic respiratory failure, end-stage renal disease on hemodialysis, atrial fibrillation, recent stroke, diabetes mellitus. The patient and her son are poor historians but from what I understand she was admitted  to the hospital with an acute vascular compromise of her right lower extremity and Dr. Wyn Quaker has done a surgical procedure and we are trying to obtain these notes. There are also some vascular workup done and we will try and obtain these notes. the injury to the left lower quadrant of abdomen and the suprapubic area have been there due to a bruise and have been there for several months and no intervention has been done. 10/11/2015 -- on review of the electronics records it was noted that the patient was admitted to the hospital on 09/14/2015 with peripheral vascular disease with claudication, end-stage renal disease, pressure ulcer, chronic atrial fibrillation. She was seen by Dr. Wyn Quaker who did her right lower extremity angiogram , angioplasty of the right anterior tibial artery and thrombolysis with TPA of the right popliteal artery, and thrombectomy. She was seen by Dr. Wyn Quaker during this past week and he was pleased with the progress. He did say that if he took her to the operating room for any procedure he would debride the abdominal wound under anesthesia. She was also seen by Dr. Ether Griffins the podiatrist who thought that she may lose her right fourth toe at some stage may need an amputation of this. [name], DUNIGAN ([id number]) 10/21/2015 --patient known to Dr. Wyn Quaker and his last office visit from 10/04/2015 has been reviewed. 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 She also had a persistent ulceration of the right heel and markedly this area and a right third and fourth toe and a small scab on the calf but these are dry and seemed to be improving. Patient also has a left carotid endarterectomy and multiple interventions to a right brachiocephalic AV fistula. After the visit he had recommended noninvasive studies to recheck her revascularization. He was off the impression that  she would likely lose the right fourth toe and the third toe was likely to heal. He was concerned about underlying muscle necrosis on her right heel and midfoot. 11/01/2015 -- an echo done in January of this year showed her left ventricular ejection fraction to be about 50-55%. The patient was seen by the PA and Dr. Driscilla Grammes office and the plan was to take her to the operating room soon to have a debridement under anesthesia for the abdominal wall wound, the Achilles tendon on the right leg and amputation of the right fourth toe. The daughter and the patient do not feel that they would be able to undergo hyperbaric oxygen therapy 5 days a week for 6 weeks. 12/17/15; this is a medically complex woman who I note was recently in this clinic however I was not involved with her care. She returns today with multiple wounds; a) she has a wound in the mid abdomen that is been there since March of this year. I note that she  is been to the overall for debridement recently. The exact etiology of this wound is not really clear b) left lower quadrant abdominal wound had some sanguinous drainage when she came in here. The patient fell in January and thinks this may have been secondary to a hematoma. c): The patient has 3 wounds on her right leg including a small wound on the right mid calf, a large area over the Achilles which currently has a wound VAC for the last 6 weeks, also a smaller wound on the distal part of the right heel. As far as I understand most of these wounds are currently been dressed with's calcium alginate. According to her daughter the Achilles wound under the wound VAC is doing well d) the patient is had an amputation of her left fifth toe in January and the right fourth toe 6 weeks ago secondary to diabetic PAD e) the patient has chronic renal failure on dialysis for the last 2 years secondary to type 2 diabetes on insulin. The daughter's knowledge there is been no biopsy of the abdominal  wounds given their current appearance and lack of undefined etiology at have to wonder about calciphylaxis. 12/18/15:Addendum; I have reviewed [facility] I can see no relevant x-rays of the right heel. I note her arteriogram and revascularization of her right lower extremity in April 2017. She had debridement of both abdominal wounds and the right heel and Achilles wound on 11/07/15 at which time she had a right fourth toe ray amputation. The abdominal wounds were debridement again on 6/29. I do not see any relevant pathology of these abdominal wounds 12/24/15; culture I did of the drainage from the midline abdominal wound last week showed both Proteus and ampicillin sensitive enterococcus. I've given her a course of Augmentin adjusted on dialysis days. She has no specific complaints today. Been using Santyl to the abdominal wounds in the right leg wound and the wound VAC on the right Achilles which was initially prescribed by Dr. dew 12/31/15; I have done two punch biopsies of the large midline abdominal. 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 May have been a trauma component of the one on the left lower quadrant however the midline wound had no such history. She has a large area on the right Achilles heel with a wound VAC prescribed by Dr. dew. A small wound on the right anterior leg.Marland Kitchen UNFORTUNATELY she has 2 new wounds today. One on the left heel which is probably a pressure area. As well her previous amputation site of her right fourth toe has dehisced and now has a small wound with significant depth at the amputation site. 01/14/2016 -- she returns after 2 weeks and had had a punch biopsy of abdominal wound done the last visit [name], [name] ([id number]) -- had a biopsy of her midline abdominal wound done and the Pathology diagnosis is that of ulceration, necrosis and inflammation and negative for dysplasia and malignancy. 01/21/16. 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 In the meantime she has new wounds of the left heel the right fourth toe amputation site is opened up. The back is stopped to the right heel area. 01/28/16; the abdominal wounds continued to improve. The extensive wound on her right Achilles also looks stable except for the lower aspect of the wound where there is a large liquefied area that probes right down to her calcaneus. This cultured Proteus last week I have her on Augmentin and  doxycycline 1. I think this is going to need a course of IV antibiotics and I will call dialysis. X-ray I did last week was negative, I think she is going to need an MRI 02/04/16; right heel MRI as before Saturday. Receiving I believe IV Rocephin at dialysis 02/11/16; as it turns out the patient could not have a MRI as she has a bladder stimulator in place even though it is not currently in use since the beginning of this year. Although she has an allergy to IV contrast she apparently has done well with premedication so we will have to go for a CT scan with contrast. In the meantime she has had a fall now has a large skin tear on her left upper arm. She went to the ER and they suggested Tegaderm over topical antibiotics 03/03/16 currently patient returns after having been hospitalized for 2 weeks and subsequently transferred to [facility] nursing facility. She actually seems to be doing excellent compared to even when we last saw her according to our nursing staff. Both patient and her daughter are extremely encouraged at how well she is presenting at this point in time. Overall the biggest issue is still the right Achilles area which is being managed at this point in time by Dr. Wyn Quaker. Patient is currently utilizing a wound VAC to that region. 03/17/16; patient is at [facility] nursing home still. Using Aquacel Ag to the wounds on her bilateral feet and still Prisma to the small open area on her abdomen. 03/31/16 at  this point in time patient has been tolerating the dressing changes currently. She fortunately has no worsening of her symptoms although she tells me that the nurse who is caring for her at [facility] nursing facility decided that nothing was needed in regard to the lower abdominal wound from a dressing standpoint at this time. she is really not having significant discomfort or pain at this point she continues to have some tunneling in the proximal Achilles wound region. 04/07/16 patient continues to do well on evaluation today. Even the Achilles wound which has been the most tender is not giving her as much trouble. Unfortunately the PolyMem dressings that we utilize last week really did not seem to benefit her in particular. Obviously we will discontinue that at this point in time today. 04-15-16:Ms. Dimichele is accompanied today by her daughter. She is still residing in an SNF undergoing dialysis, continues to receive antibiotics during dialysis as prescribed by Dr. Sampson Goon of infectious disease. She has a follow-up appointment with Dr. Sampson Goon on 04-17-16 to discuss the continuation of these antibiotics. Ms. Mckinstry denies any pain to any of the 3 remaining wounds she does admit to changing to [employer] surgical shoes for safety while ambulating with physical therapy. She denies any falls since her last appointment here although she and her daughter do admit to increased tremors of unclear etiology since her last appointment. She has tolerated the dressing changes that were prescribed last week. 04-22-16 Ms. Bittick presents today accompanied by her daughter for evaluation of her diabetic foot ulcers. She is still residing in an SNF, she continues dialysis 3 times weekly. She'll follow up with Dr. Sampson Goon last Friday, and at that appointment IV antibiotics were discontinued. According to Ms. Blacksher and her daughter if there is any regression of her wounds Dr. Sampson Goon will consider  re-starting the antibiotics. Ms. Shawley daughter states that since the discontinuation of antibiotic therapy her "twitches" have resolved. 04/29/16; I have not seen this patient  in quite some time however she is completed triple antibiotic therapy given at dialysis for osteomyelitis as prescribed by infectious disease. She is still being followed by Dr. Wyn Quaker [name] ([id number]) of vascular surgery. We are using Hydrofera Blue to the surface of these wounds. She currently has 2 open areas a substantial area over her Achilles area although this is a lot better than the last time I saw this. She also has a small wound superiorly from this wound which has a superior probing depth of 2 cm. 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 My biopsy did not prove this nevertheless I think this is the correct clinical diagnosis. We are now following her for an area on the right Achilles part of her heel. This is not any different from last week. She also has a small tunneling area just above this. And unfortunately this week there is been a reopening of an area where her right fourth toe was previously amputated. She is completed antibiotics 05/13/16; she has a new reopening on the mid abdominal wound in the same site is previously. This is a small open area. Apligraf #1 today to the areas on the right heel o2 still an open area in the fourth toe amputation site 05/27/16; the areas on her abdomen are completely closed over. Small open area from last time is closed. Apligraf #2 today to the areas on the right heel. The 4th toe amputation site is also heel 06/10/16; we did not have an Apligraf to put on today. This appeared tunneling wound on the right posterior calf appears to  be closed. The more substantial area on her Achilles itself is improved. 06/24/16; I reapplied her third Apligraf today although we have not had one debridement last time. The original tunneling wound is not as closed as last time. The more substantial area on her Achilles itself is improved with advancing epithelialization 07/08/16 Apligraf #4 today. The original long area over her Achilles as a healthy-looking area at the superior aspect and a small divot inferiorly. Both of the wound surface is look healthy 07/22/16; patient arrived today with a 2 original wounds on the Achilles part of her ankle too small for another application of Apligraf. This is on the right Achilles area. The small divot inferiorly looks as though it may have skin over most of this and I can't really see an open area. She did have an erythematous area over the medial malleolus with some drainage this looks like a rapid injury not cellulitis 07/29/16; erythematous area over the medial malleolus seems a lot better this week. This was a wrap injury. The areas on the right Achilles is still a small divot. The area superiorly down somewhat in size. 08/05/16; condition is generally not feeling well. No major changed either wound. Using Prisma 08/19/16; patient was last here she was admitted to hospital last week. Apparently she was complaining mostly of abdominal pain however a CT scan of the abdomen and pelvis revealed changes in the L2-L3 interspace suspicious for discitis and osteomyelitis as well as changes in the adjacent paraspinal and psoas muscles. She was seen by infectious disease Dr. Sampson Goon. Her urine culture grew group B strep. An aspirate of the fluid at L2-L3 I reviewed today. Her AFB Gram stain was negative,  fungal culture is still in place. C + S is negative so far as of today. She is on vancomycin and ceftazidine dosed at dialysis for 6 weeks. She is not complaining of fever or back pain currently. We have been  using [employer] and to her wounds 08/26/16; patient is still receiving IV antibiotics at dialysis. She does not feel well complaining of a lot of pain in the right hip radiating down her leg. She rubs the lateral aspect of the right hip over the greater trochanter. Her wound measurements were a lot of different this week although I don't think they were accurately recorded last week the wound on the right Achilles area looks about the same. Superficial without any depth. Objective YARESLY, MENZEL ([id number]) Constitutional Sitting or standing Blood Pressure is within target range for patient.. Pulse regular and within target range for patient.Marland Kitchen Respirations regular, non-labored and within target range.. Temperature is normal and within the target range for the patient.. Patient looks tired pale and gaunt. Vitals Time Taken: 8:13 AM, Height: 65 in, Weight: 160 lbs, BMI: 26.6, Temperature: 97.4 F, Pulse: 81 bpm, Respiratory Rate: 16 breaths/min, Blood Pressure: 140/58 mmHg. General Notes: Patient on 2L O2. Eyes Conjunctivae clear. No discharge.Marland Kitchen Respiratory Above normal respiratory effort noted. Respiratory rate elveaated. Fairly clear air entry bilaterally. Cardiovascular Heart rhythm and rate regular, without murmur or gallop.. Pedal pulses absent bilaterally.. Lymphatic None palpable in the popliteal or inguinal area. Musculoskeletal Right hip exam was normal internal and a sternal rotation was normal. No evidence of a particular right hip problem. Psychiatric Patient appears depressed today.. General Notes: Wound exam; the area on the Achilles did not require debridement. Surface this looks healthy. A deep area towards the inferior part of the calcaneus/Achilles insertion site is superficial. I see no evidence of infection around the site. There is some surface slough however no debridement was done today Integumentary (Hair, Skin) I carefully inspected her right hip and  right buttock for wounds there were none no pressure areas no rashes. Wound #9 status is Open. Original cause of wound was Gradually Appeared. The wound is located on the Right Achilles. The wound measures 2.2cm length x 0.8cm width x 0.2cm depth; 1.382cm^2 area and 0.276cm^3 volume. There is Fat Layer (Subcutaneous Tissue) Exposed exposed. There is no tunneling or undermining noted. There is a none present amount of drainage noted. The wound margin is distinct with the outline attached to the wound base. There is no granulation within the wound bed. There is a large (67- 100%) amount of necrotic tissue within the wound bed including Adherent Slough. The periwound skin appearance exhibited: Excoriation. Periwound temperature was noted as No Abnormality. The periwound has tenderness on palpation. SHERY, WAUNEKA ([id number]) Assessment Active Problems ICD-10 E11.621 - Type 2 diabetes mellitus with foot ulcer L97.512 - Non-pressure chronic ulcer of other part of right foot with fat layer exposed E11.51 - Type 2 diabetes mellitus with diabetic peripheral angiopathy without gangrene Plan Wound Cleansing: Wound #9 Right Achilles: Clean wound with Normal Saline. Cleanse wound with mild soap and water May shower with protection. Anesthetic: Wound #9 Right Achilles: Topical Lidocaine 4% cream applied to wound bed prior to debridement - all wounds for clinic use only Primary Wound Dressing: Wound #9 Right Achilles: Prisma Ag - moisten with saline Secondary Dressing: Wound #9 Right Achilles: ABD pad Dry Gauze Dressing Change Frequency: Wound #9 Right Achilles: Change dressing every other day. Follow-up Appointments: Wound #9 Right Achilles: Return Appointment in  1 week. Edema Control: Wound #9 Right Achilles: Kerlix and Coban - Right Lower Extremity - do not wrap too tight and not the whole leg Elevate legs to the level of the heart and pump ankles as often as  possible Off-Loading: Wound #9 Right Achilles: Other: - Float heels LY, WASS ([id number]) #1 we will continue with Prisma to the right Achilles area ABD, dry gauze, Kerlix and Coban #2 the patient really generally does not look well although she is alert and conversational. She is a serious infection at L2-L3 and is receiving IV antibiotics. My physical exam did not show anything else that was particularly ominous in particular no pressure areas in the right hip or buttock area, no rashes. #3 I don't think anything to do with the wound on the right Achilles area that I've been following as anything to do with her current complaints Electronic Signature(s) Signed: 08/27/2016 5:50:35 AM By: Baltazar Najjar MD Entered By: Baltazar Najjar on 08/26/2016 08:46:50 [name] ([id number]) -------------------------------------------------------------------------------- SuperBill Details Patient Name: [name]. Date of Service: 08/26/2016 Medical Record Patient Account Number: [id number] Number: Treating RN: Phillis Haggis [birth year] (82 y.o. Other Clinician: Date of Birth/Sex: Female) Treating Sylvanus Telford Primary Care Provider: [name] Provider/Extender: G Referring Provider: Charolotte Capuchin in Treatment: 36 Diagnosis Coding ICD-10 Codes Code Description E11.621 Type 2 diabetes mellitus with foot ulcer L97.512 Non-pressure chronic ulcer of other part of right foot with fat layer exposed E11.51 Type 2 diabetes mellitus with diabetic peripheral angiopathy without gangrene Facility Procedures CPT4 Code: [id number] Description: [id number] - WOUND CARE VISIT-LEV 2 EST PT Modifier: Quantity: 1 Physician Procedures CPT4 Code Description: [id number] - WC PHYS LEVEL 3 - EST PT ICD-10 Description Diagnosis E11.621 Type 2 diabetes mellitus with foot ulcer L97.512 Non-pressure chronic ulcer of other part of right fo Modifier: ot with fat lay Quantity: 1  er exposed Electronic Signature(s) Signed: 08/27/2016 5:50:35 AM By: Baltazar Najjar MD Entered By: Baltazar Najjar on 08/26/2016 08:47:08

## 2016-08-28 NOTE — Progress Notes (Signed)
[name], [name] ([id number]) Visit Report for 08/26/2016 Arrival Information Details Patient Name: [name], [name] Date of Service: 08/26/2016 8:00 AM Medical Record Patient Account Number: [id number] Number: Treating RN: Huel Coventry 1933/10/05 (83 y.o. Other Clinician: Date of Birth/Sex: Female) Treating ROBSON, MICHAEL Primary Care Alma Muegge: [name] Shonte Beutler/Extender: G Referring Lenzie Sandler: Charolotte Capuchin in Treatment: 45 Visit Information History Since Last Visit Added or deleted any medications: No Patient Arrived: Wheel Chair Any new allergies or adverse reactions: No Arrival Time: 08:12 Had a fall or experienced change in No Accompanied By: daughter activities of daily living that may affect Transfer Assistance: Manual risk of falls: Patient Identification Verified: Yes Signs or symptoms of abuse/neglect No Secondary Verification Process Yes since last visito Completed: Hospitalized since last visit: No Patient Requires Transmission- No Has Dressing in Place as Prescribed: Yes Based Precautions: Has Footwear/Offloading in Place as Yes Patient Has Alerts: Yes Prescribed: Patient Alerts: Patient on Blood Left: Surgical Shoe with Thinner Pressure Relief Insole DMII Right: Surgical Shoe with Warfarin Pressure Relief Insole ABI Gobles Bilateral NO BP RIGHT Pain Present Now: No ARM Electronic Signature(s) Signed: 08/26/2016 2:38:45 PM By: Elliot Gurney, RN, BSN, Kim RN, BSN Entered By: Elliot Gurney, RN, BSN, Kim on 08/26/2016 08:13:00 [name] ([id number]) -------------------------------------------------------------------------------- Clinic Level of Care Assessment Details Patient Name: [name]. Date of Service: 08/26/2016 8:00 AM Medical Record Patient Account Number: [id number] Number: Treating RN: Huel Coventry Nov 15, 1933 (83 y.o. Other Clinician: Date of Birth/Sex: Female) Treating ROBSON, MICHAEL Primary Care Malissie Musgrave:  [name] Yicel Shannon/Extender: G Referring Davon Folta: Charolotte Capuchin in Treatment: 36 Clinic Level of Care Assessment Items TOOL 4 Quantity Score []  - Use when only an EandM is performed on FOLLOW-UP visit 0 ASSESSMENTS - Nursing Assessment / Reassessment []  - Reassessment of Co-morbidities (includes updates in patient status) 0 []  - Reassessment of Adherence to Treatment Plan 0 ASSESSMENTS - Wound and Skin Assessment / Reassessment X - Simple Wound Assessment / Reassessment - one wound 1 5 []  - Complex Wound Assessment / Reassessment - multiple wounds 0 []  - Dermatologic / Skin Assessment (not related to wound area) 0 ASSESSMENTS - Focused Assessment []  - Circumferential Edema Measurements - multi extremities 0 []  - Nutritional Assessment / Counseling / Intervention 0 []  - Lower Extremity Assessment (monofilament, tuning fork, pulses) 0 []  - Peripheral Arterial Disease Assessment (using hand held doppler) 0 ASSESSMENTS - Ostomy and/or Continence Assessment and Care []  - Incontinence Assessment and Management 0 []  - Ostomy Care Assessment and Management (repouching, etc.) 0 PROCESS - Coordination of Care X - Simple Patient / Family Education for ongoing care 1 15 []  - Complex (extensive) Patient / Family Education for ongoing care 0 X - Staff obtains Chiropractor, Records, Test Results / Process Orders 1 10 []  - Staff telephones HHA, Nursing Homes / Clarify orders / etc 0 [name], [name] ([id number]) []  - Routine Transfer to another Facility (non-emergent condition) 0 []  - Routine Hospital Admission (non-emergent condition) 0 []  - New Admissions / Manufacturing engineer / Ordering NPWT, Apligraf, etc. 0 []  - Emergency Hospital Admission (emergent condition) 0 X - Simple Discharge Coordination 1 10 []  - Complex (extensive) Discharge Coordination 0 PROCESS - Special Needs []  - Pediatric / Minor Patient Management 0 []  - Isolation Patient Management 0 []  - Hearing / Language  / Visual special needs 0 []  - Assessment of Community assistance (transportation, D/C planning, etc.) 0 []  - Additional assistance / Altered mentation 0 []  - Support Surface(s) Assessment (  bed, cushion, seat, etc.) 0 INTERVENTIONS - Wound Cleansing / Measurement X - Simple Wound Cleansing - one wound 1 5  - Complex Wound Cleansing - multiple wounds 0 X - Wound Imaging (photographs - any number of wounds) 1 5  - Wound Tracing (instead of photographs) 0 X - Simple Wound Measurement - one wound 1 5  - Complex Wound Measurement - multiple wounds 0 INTERVENTIONS - Wound Dressings X - Small Wound Dressing one or multiple wounds 1 10  - Medium Wound Dressing one or multiple wounds 0  - Large Wound Dressing one or multiple wounds 0  - Application of Medications - topical 0  - Application of Medications - injection 0 [name], [name] ([id number]) INTERVENTIONS - Miscellaneous  - External ear exam 0  - Specimen Collection (cultures, biopsies, blood, body fluids, etc.) 0  - Specimen(s) / Culture(s) sent or taken to Lab for analysis 0  - Patient Transfer (multiple staff / Michiel Sites Lift / Similar devices) 0  - Simple Staple / Suture removal (25 or less) 0  - Complex Staple / Suture removal (26 or more) 0  - Hypo / Hyperglycemic Management (close monitor of Blood Glucose) 0  - Ankle / Brachial Index (ABI) - do not check if billed separately 0 X - Vital Signs 1 5 Has the patient been seen at the hospital within the last three years: Yes Total Score: 70 Level Of Care: New/Established - Level 2 Electronic Signature(s) Signed: 08/26/2016 2:38:45 PM By: Elliot Gurney, RN, BSN, Kim RN, BSN Entered By: Elliot Gurney, RN, BSN, Kim on 08/26/2016 08:32:52 [name] ([id number]) -------------------------------------------------------------------------------- Encounter Discharge Information Details Patient Name: [name], [name]. Date of Service: 08/26/2016 8:00 AM Medical Record Patient  Account Number: [id number] Number: Treating RN: Phillis Haggis June 28, 1933 (82 y.o. Other Clinician: Date of Birth/Sex: Female) Treating ROBSON, MICHAEL Primary Care Desera Graffeo: [name] Jenevie Casstevens/Extender: G Referring Trayven Lumadue: Charolotte Capuchin in Treatment: 61 Encounter Discharge Information Items Discharge Pain Level: 0 Discharge Condition: Stable Ambulatory Status: Wheelchair Discharge Destination: Nursing Home Transportation: Private Auto Accompanied By: daughter Schedule Follow-up Appointment: Yes Medication Reconciliation completed and provided to Patient/Care Yes Marisol Giambra: Provided on Clinical Summary of Care: 08/26/2016 Form Type Recipient Paper Patient [facility] Electronic Signature(s) Signed: 08/26/2016 2:38:45 PM By: Elliot Gurney RN, BSN, Kim RN, BSN Previous Signature: 08/26/2016 8:41:17 AM Version By: Gwenlyn Perking Entered By: Elliot Gurney RN, BSN, Kim on 08/26/2016 08:45:37 [name] ([id number]) -------------------------------------------------------------------------------- Lower Extremity Assessment Details Patient Name: [name]. Date of Service: 08/26/2016 8:00 AM Medical Record Patient Account Number: [id number] Number: Treating RN: Huel Coventry 02-07-1934 (82 y.o. Other Clinician: Date of Birth/Sex: Female) Treating ROBSON, MICHAEL Primary Care Ruhaan Nordahl: [name] Artina Minella/Extender: G Referring Nicolo Tomko: Charolotte Capuchin in Treatment: 36 Vascular Assessment Pulses: Dorsalis Pedis Palpable: [Right:No] Doppler Audible: [Right:Yes] Posterior Tibial Palpable: [Right:No] Extremity colors, hair growth, and conditions: Extremity Color: [Right:Hyperpigmented] Hair Growth on Extremity: [Right:No] Temperature of Extremity: [Right:Warm] Capillary Refill: [Right:< 3 seconds] Dependent Rubor: [Right:No] Blanched when Elevated: [Right:No] Lipodermatosclerosis: [Right:No] Toe Nail Assessment Left: Right: Thick:  No Discolored: Yes Deformed: No Improper Length and Hygiene: Yes Electronic Signature(s) Signed: 08/26/2016 2:38:45 PM By: Elliot Gurney, RN, BSN, Kim RN, BSN Entered By: Elliot Gurney, RN, BSN, Kim on 08/26/2016 08:28:50 [name] ([id number]) -------------------------------------------------------------------------------- Multi Wound Chart Details Patient Name: [name]. Date of Service: 08/26/2016 8:00 AM Medical Record Patient Account Number: [id number] Number: Treating RN: Huel Coventry 09-Jan-1934 (82 y.o. 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] Comorbid History: [9:Arrhythmia, Congestive N/A Heart Failure, Hypertension, Type II Diabetes] Date Acquired: [9:06/02/2015] [N/A:N/A] Weeks of Treatment: [9:36] [N/A:N/A] Wound Status: [9:Open] [N/A:N/A] Pending Amputation on Yes [N/A:N/A] Presentation: Measurements L x W x D 2.2x0.8x0.2 [N/A:N/A] (cm) Area (cm) : [9:1.382] [N/A:N/A] Volume (cm) : [9:0.276] [N/A:N/A] % Reduction in Area: [9:95.80%] [N/A:N/A] % Reduction in Volume: 97.20% [N/A:N/A] Classification: [9:Grade 2] [N/A:N/A] Exudate Amount: [9:None Present] [N/A:N/A] Wound Margin: [9:Distinct, outline attached N/A] Granulation Amount: [9:None Present (0%)] [N/A:N/A] Necrotic Amount: [9:Large (67-100%)] [N/A:N/A] Exposed Structures: [9:Fat Layer (Subcutaneous N/A Tissue) Exposed:  Yes] Epithelialization: Small (1-33%) N/A N/A Periwound Skin Texture: Excoriation: Yes N/A N/A Periwound Skin No Abnormalities Noted N/A N/A Moisture: Periwound Skin Color: No Abnormalities Noted N/A N/A Temperature: No Abnormality N/A N/A Tenderness on Yes N/A N/A Palpation: Wound Preparation: Ulcer Cleansing: N/A N/A Rinsed/Irrigated with Saline, Other: soap and water Topical Anesthetic Applied: Other: lidocaine 4% Treatment Notes Electronic Signature(s) Signed: 08/27/2016 5:50:35 AM By: Baltazar Najjar MD Entered By: Baltazar Najjar on 08/26/2016 08:42:04 [name] ([id number]) -------------------------------------------------------------------------------- Multi-Disciplinary Care Plan Details Patient Name: [name], OZBUN. Date of Service: 08/26/2016 8:00 AM Medical Record Patient Account Number: [id number] Number: Treating RN: Huel Coventry [birth year] (82 y.o. Other Clinician: Date of Birth/Sex: Female) Treating ROBSON, MICHAEL Primary Care Sophiya Morello: [name] Veniamin Kincaid/Extender: G Referring Florabelle Cardin: Charolotte Capuchin in Treatment: 59 Active Inactive ` Abuse / Safety / Falls / Self Care Management Nursing Diagnoses: Impaired physical mobility Potential for falls Goals: Patient will remain injury free Date Initiated: 12/17/2015 Target Resolution Date: 08/29/2016 Goal Status: Active Interventions: Assess fall risk on admission and as needed Notes: ` Necrotic Tissue Nursing Diagnoses: Impaired tissue integrity related to necrotic/devitalized tissue Knowledge deficit related to management of necrotic/devitalized tissue Goals: Necrotic/devitalized tissue will be minimized in the wound bed Date Initiated: 02/04/2016 Target Resolution Date: 08/29/2016 Goal Status: Active Interventions: Assess patient pain level pre-, during and post procedure and prior to discharge Provide education on necrotic tissue and debridement process Treatment  Activities: Apply topical anesthetic as ordered : 02/04/2016 [name], [name] ([id number]) Notes: ` Wound/Skin Impairment Nursing Diagnoses: Impaired tissue integrity Goals: Patient/caregiver will verbalize understanding of skin care regimen Date Initiated: 12/17/2015 Target Resolution Date: 08/29/2016 Goal Status: Active Ulcer/skin breakdown will have a volume reduction of 30% by week 4 Date Initiated: 12/17/2015 Target Resolution Date: 08/29/2016 Goal Status: Active Ulcer/skin breakdown will have a volume reduction of 50% by week 8 Date Initiated: 12/17/2015 Target Resolution Date: 08/29/2016 Goal Status: Active Ulcer/skin breakdown will have a volume reduction of 80% by week 12 Date Initiated: 12/17/2015 Target Resolution Date: 08/29/2016 Goal Status: Active Ulcer/skin breakdown will heal within 14 weeks Date Initiated: 12/17/2015 Target Resolution Date: 08/29/2016 Goal Status: Active Interventions: Assess patient/caregiver ability to obtain necessary supplies Assess patient/caregiver ability to perform ulcer/skin care regimen upon admission and as needed Assess ulceration(s) every visit Notes: Electronic Signature(s) Signed: 08/26/2016 2:38:45 PM By: Elliot Gurney, RN, BSN, Kim RN, BSN Entered By: Elliot Gurney, RN, BSN, Kim on 08/26/2016 08:28:56 [name] ([id number]) -------------------------------------------------------------------------------- Pain Assessment Details Patient Name: [name]. Date of Service: 08/26/2016 8:00 AM Medical  Record Patient Account Number: [id number] Number: Treating RN: Huel Coventry [birth year] (82 y.o. Other Clinician: Date of Birth/Sex: Female) Treating ROBSON, MICHAEL Primary Care Hildred Pharo: [name] Tassie Pollett/Extender: G Referring Janiene Aarons: Charolotte Capuchin in Treatment: 36 Active Problems Location of Pain Severity and Description of Pain Patient Has Paino Yes Site Locations Pain Location: Generalized Pain Pain  Management and Medication Current Pain Management: Goals for Pain Management Back pain Electronic Signature(s) Signed: 08/26/2016 2:38:45 PM By: Elliot Gurney, RN, BSN, Kim RN, BSN Entered By: Elliot Gurney, RN, BSN, Kim on 08/26/2016 08:13:38 [name] ([id number]) -------------------------------------------------------------------------------- Patient/Caregiver Education Details Patient Name: [name] Date of Service: 08/26/2016 8:00 AM Medical Record Patient Account Number: [id number] Number: Treating RN: Huel Coventry January 13, 1934 (82 y.o. Other Clinician: Date of Birth/Gender: Female) Treating ROBSON, MICHAEL Primary Care Physician: [name] Physician/Extender: G Referring Physician: Charolotte Capuchin in Treatment: 14 Education Assessment Education Provided To: Patient and Caregiver Education Topics Provided Wound/Skin Impairment: Handouts: Caring for Your Ulcer, Other: snf note sent with patient Methods: Demonstration, Explain/Verbal Responses: State content correctly Electronic Signature(s) Signed: 08/26/2016 2:38:45 PM By: Elliot Gurney, RN, BSN, Kim RN, BSN Entered By: Elliot Gurney, RN, BSN, Kim on 08/26/2016 08:45:59 [name] ([id number]) -------------------------------------------------------------------------------- Wound Assessment Details Patient Name: [name]. Date of Service: 08/26/2016 8:00 AM Medical Record Patient Account Number: [id number] Number: Treating RN: Huel Coventry 1933-06-19 (82 y.o. Other Clinician: Date of Birth/Sex: Female) Treating ROBSON, MICHAEL Primary Care Lorilyn Laitinen: [name] Taffie Eckmann/Extender: G Referring Carleena Mires: Charolotte Capuchin in Treatment: 36 Wound Status Wound Number: 9 Primary Diabetic Wound/Ulcer of the Lower Etiology: Extremity Wound Location: Right Achilles Secondary Arterial Insufficiency Ulcer Wounding Event: Gradually Appeared Etiology: Date Acquired: 06/02/2015 Wound Open Weeks Of  Treatment: 36 Status: Clustered Wound: No Comorbid Arrhythmia, Congestive Heart Pending Amputation On Presentation History: Failure, Hypertension, Type II Diabetes Photos Photo Uploaded By: Elliot Gurney, RN, BSN, Kim on 08/26/2016 09:07:50 Wound Measurements Length: (cm) 2.2 Width: (cm) 0.8 Depth: (cm) 0.2 Area: (cm) 1.382 Volume: (cm) 0.276 % Reduction in Area: 95.8% % Reduction in Volume: 97.2% Epithelialization: Small (1-33%) Tunneling: No Undermining: No Wound Description Classification: Grade 2 Foul Odor Afte Wound Margin: Distinct, outline attached Slough/Fibrino Exudate Amount: None Present r Cleansing: No Yes Wound Bed Granulation Amount: None Present (0%) Exposed Structure Necrotic Amount: Large (67-100%) Fat Layer (Subcutaneous Tissue) Exposed: Yes Necrotic Quality: Adherent Slough Periwound Skin Texture [name], [name] ([id number]) Texture Color No Abnormalities Noted: No No Abnormalities Noted: No Excoriation: Yes Temperature / Pain Moisture Temperature: No Abnormality No Abnormalities Noted: No Tenderness on Palpation: Yes Wound Preparation Ulcer Cleansing: Rinsed/Irrigated with Saline, Other: soap and water, Topical Anesthetic Applied: Other: lidocaine 4%, Treatment Notes Wound #9 (Right Achilles) 1. Cleansed with: Clean wound with Normal Saline 2. Anesthetic Topical Lidocaine 4% cream to wound bed prior to debridement 4. Dressing Applied: Prisma Ag 5. Secondary Dressing Applied ABD and Kerlix/Conform Notes kerlix, Theatre manager) Signed: 08/26/2016 2:38:45 PM By: Elliot Gurney, RN, BSN, Kim RN, BSN Entered By: Elliot Gurney, RN, BSN, Kim on 08/26/2016 08:27:38 [name] ([id number]) -------------------------------------------------------------------------------- Vitals Details Patient Name: [name] Date of Service: 08/26/2016 8:00 AM Medical Record Patient Account Number: [id number] Number: Treating RN: Huel Coventry 10-09-1933 (82 y.o. Other Clinician: Date of Birth/Sex: Female) Treating ROBSON, MICHAEL Primary Care Ahliyah Nienow: [name] Brandi Armato/Extender: G Referring Micholas Drumwright: Charolotte Capuchin in Treatment: 36 Vital Signs Time Taken: 08:13 Temperature (F): 97.4 Height (in): 65 Pulse (bpm): 81 Weight (lbs): 160 Respiratory Rate (breaths/min): 16  Body Mass Index (BMI): 26.6 Blood Pressure (mmHg): 140/58 Reference Range: 80 - 120 mg / dl Notes Patient on 2L O2. Electronic Signature(s) Signed: 08/26/2016 2:38:45 PM By: Elliot Gurney, RN, BSN, Kim RN, BSN Entered By: Elliot Gurney, RN, BSN, Kim on 08/26/2016 40:98:11

## 2016-09-04 LAB — CULTURE, FUNGUS WITHOUT SMEAR: Special Requests: NORMAL

## 2016-09-09 ENCOUNTER — Encounter: Payer: No Typology Code available for payment source | Attending: Internal Medicine | Admitting: Internal Medicine

## 2016-09-09 DIAGNOSIS — E1151 Type 2 diabetes mellitus with diabetic peripheral angiopathy without gangrene: Secondary | ICD-10-CM | POA: Insufficient documentation

## 2016-09-09 DIAGNOSIS — L97512 Non-pressure chronic ulcer of other part of right foot with fat layer exposed: Secondary | ICD-10-CM | POA: Diagnosis not present

## 2016-09-09 DIAGNOSIS — E11621 Type 2 diabetes mellitus with foot ulcer: Secondary | ICD-10-CM | POA: Insufficient documentation

## 2016-09-09 DIAGNOSIS — I482 Chronic atrial fibrillation: Secondary | ICD-10-CM | POA: Insufficient documentation

## 2016-09-09 DIAGNOSIS — Z992 Dependence on renal dialysis: Secondary | ICD-10-CM | POA: Insufficient documentation

## 2016-09-09 DIAGNOSIS — E1122 Type 2 diabetes mellitus with diabetic chronic kidney disease: Secondary | ICD-10-CM | POA: Diagnosis not present

## 2016-09-09 DIAGNOSIS — I5033 Acute on chronic diastolic (congestive) heart failure: Secondary | ICD-10-CM | POA: Diagnosis not present

## 2016-09-09 DIAGNOSIS — Z8673 Personal history of transient ischemic attack (TIA), and cerebral infarction without residual deficits: Secondary | ICD-10-CM | POA: Insufficient documentation

## 2016-09-09 DIAGNOSIS — N186 End stage renal disease: Secondary | ICD-10-CM | POA: Diagnosis not present

## 2016-09-09 DIAGNOSIS — Z794 Long term (current) use of insulin: Secondary | ICD-10-CM | POA: Diagnosis not present

## 2016-09-09 DIAGNOSIS — I132 Hypertensive heart and chronic kidney disease with heart failure and with stage 5 chronic kidney disease, or end stage renal disease: Secondary | ICD-10-CM | POA: Diagnosis not present

## 2016-09-11 NOTE — Progress Notes (Signed)
[name], [name] ([id number]) Visit Report for 09/09/2016 Debridement Details Patient Name: [name]. Date of Service: 09/09/2016 8:00 AM Medical Record Patient Account Number: [id number] Number: Treating RN: Phillis Haggis 06-Sep-1933 (82 y.o. Other Clinician: Date of Birth/Sex: Female) Treating Toney Lizaola Primary Care Provider: [name] Provider/Extender: G Referring Provider: Charolotte Capuchin in Treatment: 38 Debridement Performed for Wound #9 Right Achilles Assessment: Performed By: Physician Maxwell Caul, MD Debridement: Debridement Pre-procedure Yes - 08:27 Verification/Time Out Taken: Start Time: 08:28 Pain Control: Lidocaine 4% Topical Solution Level: Skin/Subcutaneous Tissue Total Area Debrided (L x 1 (cm) x 0.8 (cm) = 0.8 (cm) W): Tissue and other Viable, Non-Viable, Exudate, Fibrin/Slough, Subcutaneous material debrided: Instrument: Blade, Forceps Bleeding: Minimum Hemostasis Achieved: Pressure End Time: 08:41 Procedural Pain: 0 Post Procedural Pain: 0 Response to Treatment: Procedure was tolerated well Post Debridement Measurements of Total Wound Length: (cm) 1 Width: (cm) 0.8 Depth: (cm) 0.1 Volume: (cm) 0.063 Character of Wound/Ulcer Post Requires Further Debridement Debridement: Severity of Tissue Post Debridement: Fat layer exposed Post Procedure Diagnosis Same as Pre-procedure [name], [name] ([id number]) Electronic Signature(s) Signed: 09/09/2016 4:54:24 PM By: Alejandro Mulling Signed: 09/09/2016 5:13:56 PM By: Baltazar Najjar MD Entered By: Baltazar Najjar on 09/09/2016 12:49:29 [name] ([id number]) -------------------------------------------------------------------------------- HPI Details Patient Name: [name] Date of Service: 09/09/2016 8:00 AM Medical Record Patient Account Number: [id number] Number: Treating RN: Phillis Haggis [birth year] (82 y.o. Other Clinician: Date  of Birth/Sex: Female) Treating Kathaleen Dudziak Primary Care Provider: [name] Provider/Extender: G Referring Provider: Charolotte Capuchin in Treatment: 7 History of Present Illness Location: right posterior heel, right Achilles, right lower quadrant abdomen, right fourth toe amp site Quality: denies pain to any wound Severity: not applicable Timing: denies pain HPI Description: 83 year old patient who is known to be diabetic, was referred to [place] by Dr. Gavin Potters for a right heel ulceration which she's had for a while. She was recently in hospital for a pneumonia and at that time and got delirious and was disoriented and sometime during this time developed a stage II ulcer on her right heel. Her past medical history is significant for bilateral pneumonia which was treated with injectable antibiotics and then to oral Levaquin which he has completed. She also has acute on chronic diastolic CHF, acute on chronic respiratory failure, end-stage renal disease on hemodialysis, atrial fibrillation, recent stroke, diabetes mellitus. The patient and her son are poor historians but from what I understand she was admitted to the hospital with an acute vascular compromise of her right lower extremity and Dr. Wyn Quaker has done a surgical procedure and we are trying to obtain these notes. There are also some vascular workup done and we will try and obtain these notes. the injury to the left lower quadrant of abdomen and the suprapubic area have been there due to a bruise and have been there for several months and no intervention has been done. 10/11/2015 -- on review of the electronics records it was noted that the patient was admitted to the hospital on 09/14/2015 with peripheral vascular disease with claudication, end-stage renal disease, pressure ulcer, chronic atrial fibrillation. She was seen by Dr. Wyn Quaker who did her right lower extremity angiogram , angioplasty of the right anterior tibial artery and  thrombolysis with TPA of the right popliteal artery, and thrombectomy. She was seen by Dr. Wyn Quaker during this past week and he was pleased with the progress. He did say that if he took her to the operating room  for any procedure he would debride the abdominal wound under anesthesia. She was also seen by Dr. Ether Griffins the podiatrist who thought that she may lose her right fourth toe at some stage may need an amputation of this. 10/21/2015 --patient known to Dr. Wyn Quaker and his last office visit from 10/04/2015 has been reviewed. 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 She also had a persistent ulceration of the right heel and markedly this area and a right third and fourth toe and a small scab on the calf but these are dry and seemed to be improving. Patient also has a left carotid endarterectomy and multiple interventions to a right brachiocephalic AV fistula. After the visit he had recommended noninvasive studies to recheck her revascularization. He was off the impression that she would likely lose the right fourth toe and the third toe was likely to heal. [name] ([id number]) He was concerned about underlying muscle necrosis on her right heel and midfoot. 11/01/2015 -- an echo done in January of this year showed her left ventricular ejection fraction to be about 50-55%. The patient was seen by the PA and Dr. Driscilla Grammes office and the plan was to take her to the operating room soon to have a debridement under anesthesia for the abdominal wall wound, the Achilles tendon on the right leg and amputation of the right fourth toe. The daughter and the patient do not feel that they would be able to undergo hyperbaric oxygen therapy 5 days a week for 6 weeks. 12/17/15; this is a medically complex woman who I note was recently in this clinic however I was not involved with her care. She  returns today with multiple wounds; a) she has a wound in the mid abdomen that is been there since March of this year. I note that she is been to the overall for debridement recently. The exact etiology of this wound is not really clear b) left lower quadrant abdominal wound had some sanguinous drainage when she came in here. The patient fell in January and thinks this may have been secondary to a hematoma. c): The patient has 3 wounds on her right leg including a small wound on the right mid calf, a large area over the Achilles which currently has a wound VAC for the last 6 weeks, also a smaller wound on the distal part of the right heel. As far as I understand most of these wounds are currently been dressed with's calcium alginate. According to her daughter the Achilles wound under the wound VAC is doing well d) the patient is had an amputation of her left fifth toe in January and the right fourth toe 6 weeks ago secondary to diabetic PAD e) the patient has chronic renal failure on dialysis for the last 2 years secondary to type 2 diabetes on insulin. The daughter's knowledge there is been no biopsy of the abdominal wounds given their current appearance and lack of undefined etiology at have to wonder about calciphylaxis. 12/18/15:Addendum; I have reviewed [facility] I can see no relevant x-rays of the right heel. I note her arteriogram and revascularization of her right lower extremity in April 2017. She had debridement of both abdominal wounds and the right heel and Achilles wound on 11/07/15 at which time she had a right fourth toe ray amputation. The abdominal wounds were debridement again on 6/29. I do not see any relevant pathology  of these abdominal wounds 12/24/15; culture I did of the drainage from the midline abdominal wound last week showed both Proteus and ampicillin sensitive enterococcus. I've given her a course of Augmentin adjusted on dialysis days. She has no specific  complaints today. Been using Santyl to the abdominal wounds in the right leg wound and the wound VAC on the right Achilles which was initially prescribed by Dr. dew 12/31/15; I have done two punch biopsies of the large midline abdominal. 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 May have been a trauma component of the one on the left lower quadrant however the midline wound had no such history. She has a large area on the right Achilles heel with a wound VAC prescribed by Dr. dew. A small wound on the right anterior leg.Marland Kitchen UNFORTUNATELY she has 2 new wounds today. One on the left heel which is probably a pressure area. As well her previous amputation site of her right fourth toe has dehisced and now has a small wound with significant depth at the amputation site. 01/14/2016 -- she returns after 2 weeks and had had a punch biopsy of abdominal wound done the last visit -- had a biopsy of her midline abdominal wound done and the Pathology diagnosis is that of ulceration, necrosis and inflammation and negative for dysplasia and malignancy. 01/21/16. 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 In the meantime she has new wounds of the left heel the right fourth toe amputation site is opened up. The back is stopped to the right heel area. 01/28/16; the abdominal wounds continued to improve. The extensive wound on her right Achilles also looks stable except for the lower aspect of the wound where there is a large liquefied area that probes right down to her calcaneus. This cultured Proteus last week I have her on Augmentin and doxycycline 1. I think this is [name], [name] ([id number]) going to need a course of IV antibiotics and I will call dialysis. X-ray I did last week was negative, I think she is going to need an MRI 02/04/16; right heel MRI as before Saturday. Receiving I believe IV Rocephin at dialysis 02/11/16; as it turns out the patient  could not have a MRI as she has a bladder stimulator in place even though it is not currently in use since the beginning of this year. Although she has an allergy to IV contrast she apparently has done well with premedication so we will have to go for a CT scan with contrast. In the meantime she has had a fall now has a large skin tear on her left upper arm. She went to the ER and they suggested Tegaderm over topical antibiotics 03/03/16 currently patient returns after having been hospitalized for 2 weeks and subsequently transferred to [facility] nursing facility. She actually seems to be doing excellent compared to even when we last saw her according to our nursing staff. Both patient and her daughter are extremely encouraged at how well she is presenting at this point in time. Overall the biggest issue is still the right Achilles area which is being managed at this point in time by Dr. Wyn Quaker. Patient is currently utilizing a wound VAC to that region. 03/17/16; patient is at [facility] nursing home still. Using Aquacel Ag to the wounds on her bilateral feet and still Prisma to the small open area on her abdomen. 03/31/16 at this point in time patient has been tolerating the dressing changes  currently. She fortunately has no worsening of her symptoms although she tells me that the nurse who is caring for her at [facility] nursing facility decided that nothing was needed in regard to the lower abdominal wound from a dressing standpoint at this time. she is really not having significant discomfort or pain at this point she continues to have some tunneling in the proximal Achilles wound region. 04/07/16 patient continues to do well on evaluation today. Even the Achilles wound which has been the most tender is not giving her as much trouble. Unfortunately the PolyMem dressings that we utilize last week really did not seem to benefit her in particular. Obviously we will discontinue that at this point in time  today. 04-15-16:Ms. Berne is accompanied today by her daughter. She is still residing in an SNF undergoing dialysis, continues to receive antibiotics during dialysis as prescribed by Dr. Sampson Goon of infectious disease. She has a follow-up appointment with Dr. Sampson Goon on 04-17-16 to discuss the continuation of these antibiotics. Ms. Osterberg denies any pain to any of the 3 remaining wounds she does admit to changing to [employer] surgical shoes for safety while ambulating with physical therapy. She denies any falls since her last appointment here although she and her daughter do admit to increased tremors of unclear etiology since her last appointment. She has tolerated the dressing changes that were prescribed last week. 04-22-16 Ms. Skoda presents today accompanied by her daughter for evaluation of her diabetic foot ulcers. She is still residing in an SNF, she continues dialysis 3 times weekly. She'll follow up with Dr. Sampson Goon last Friday, and at that appointment IV antibiotics were discontinued. According to Ms. Alridge and her daughter if there is any regression of her wounds Dr. Sampson Goon will consider re-starting the antibiotics. Ms. Newstrom daughter states that since the discontinuation of antibiotic therapy her "twitches" have resolved. 04/29/16; I have not seen this patient in quite some time however she is completed triple antibiotic therapy given at dialysis for osteomyelitis as prescribed by infectious disease. She is still being followed by Dr. dew of vascular surgery. We are using Hydrofera Blue to the surface of these wounds. She currently has 2 open areas a substantial area over her Achilles area although this is a lot better than the last time I saw this. She also has a small wound superiorly from this wound which has a superior probing depth of 2 cm. 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 My biopsy did not prove this nevertheless I think this is the correct clinical diagnosis. We are now following her for an area on the right Achilles part of her heel. This is not any different from last week. [name], [name] ([id number]) She also has a small tunneling area just above this. And unfortunately this week there is been a reopening of an area where her right fourth toe was previously amputated. She is completed antibiotics 05/13/16; she has a new reopening on the mid abdominal wound in the same site is previously. This is a small open area. Apligraf #1 today to the areas on the right heel o2 still an open area in the fourth toe amputation site 05/27/16; the areas on her abdomen are completely closed over. Small open area from last time is closed. Apligraf #2 today  to the areas on the right heel. The 4th toe amputation site is also heel 06/10/16; we did not have an Apligraf to put on today. This appeared tunneling wound on the right posterior calf appears to be closed. The more substantial area on her Achilles itself is improved. 06/24/16; I reapplied her third Apligraf today although we have not had one debridement last time. The original tunneling wound is not as closed as last time. The more substantial area on her Achilles itself is improved with advancing epithelialization 07/08/16 Apligraf #4 today. The original long area over her Achilles as a healthy-looking area at the superior aspect and a small divot inferiorly. Both of the wound surface is look healthy 07/22/16; patient arrived today with a 2 original wounds on the Achilles part of her ankle too small for another application of Apligraf. This is on the right Achilles area. The small divot inferiorly looks as though it may have skin over most of this and I can't really see an open area.  She did have an erythematous area over the medial malleolus with some drainage this looks like a rapid injury not cellulitis 07/29/16; erythematous area over the medial malleolus seems a lot better this week. This was a wrap injury. The areas on the right Achilles is still a small divot. The area superiorly down somewhat in size. 08/05/16; condition is generally not feeling well. No major changed either wound. Using Prisma 08/19/16; patient was last here she was admitted to hospital last week. Apparently she was complaining mostly of abdominal pain however a CT scan of the abdomen and pelvis revealed changes in the L2-L3 interspace suspicious for discitis and osteomyelitis as well as changes in the adjacent paraspinal and psoas muscles. She was seen by infectious disease Dr. Sampson Goon. Her urine culture grew group B strep. An aspirate of the fluid at L2-L3 I reviewed today. Her AFB Gram stain was negative, fungal culture is still in place. C + S is negative so far as of today. She is on vancomycin and ceftazidine dosed at dialysis for 6 weeks. She is not complaining of fever or back pain currently. We have been using [employer] and to her wounds 08/26/16; patient is still receiving IV antibiotics at dialysis. She does not feel well complaining of a lot of pain in the right hip radiating down her leg. She rubs the lateral aspect of the right hip over the greater trochanter. Her wound measurements were a lot of different this week although I don't think they were accurately recorded last week the wound on the right Achilles area looks about the same. Superficial without any depth. 09/09/16; patient is still receiving IV antibiotics at dialysis chide believe are vancomycin and ceftazidine. Since she was last here she is developed a new wound on the tip of her right great toe. The right Achilles wound had significant surface slough/necrotic material on arrival today Also of note the patient seems to  be developing widespread myoclonic jerking which is gotten a lot worse over the last week to 2 although our intake nurse and the patient's daughter both state that she had had this at certain times in the past I had not specifically noticed this. Apparently at one point this was due to phosphorus abnormalities which were corrected at dialysis. I have reviewed her medication list I don't see any usual offenders except she is on Paxil Neurontin and Ultram although she takes the Ultram sparingly maybe once a week Electronic Signature(s) Signed: 09/09/2016 5:13:56  PM By: Baltazar Najjar MD Entered By: Baltazar Najjar on 09/09/2016 12:51:56 [name] ([id number]) -------------------------------------------------------------------------------- Physical Exam Details Patient Name: [name] Date of Service: 09/09/2016 8:00 AM Medical Record Patient Account Number: [id number] Number: Treating RN: Phillis Haggis 1934-05-14 (82 y.o. Other Clinician: Date of Birth/Sex: Female) Treating Jeny Nield Primary Care Provider: [name] Provider/Extender: G Referring Provider: Charolotte Capuchin in Treatment: 64 Constitutional Sitting or standing Blood Pressure is within target range for patient.. Pulse regular and within target range for patient.Marland Kitchen Respirations regular, non-labored and within target range.. Temperature is normal and within the target range for the patient.. Patient looks somewhat fatigued but in general not in any distress. She is awake and conversational. Eyes Conjunctivae clear. No discharge.Marland Kitchen Respiratory Respiratory effort is easy and symmetric bilaterally. Rate is normal at rest and on room air.. Cardiovascular Heart rhythm and rate regular, without murmur or gallop.. Gastrointestinal (GI) Abdomen is soft and non-distended without masses or tenderness. Bowel sounds active in all quadrants.. Psychiatric No evidence of depression, anxiety, or  agitation. Calm, cooperative, and communicative. Appropriate interactions and affect.. Notes Exam; the area of the Achilles quire do reasonably extensive debridement of surface eschar and nonviable subcutaneous tissue. We used a scalpel and pickups. Post debridement the remnants of the wound actually looks quite healthy oShe has a new wound over the right great toe tip this is covered with adherent black eschar. I did not debridement this today. Electronic Signature(s) Signed: 09/09/2016 5:13:56 PM By: Baltazar Najjar MD Entered By: Baltazar Najjar on 09/09/2016 12:53:51 [name] ([id number]) -------------------------------------------------------------------------------- Physician Orders Details Patient Name: DAISEE, CENTNER. Date of Service: 09/09/2016 8:00 AM Medical Record Patient Account Number: [id number] Number: Treating RN: Phillis Haggis January 28, 1934 (82 y.o. Other Clinician: Date of Birth/Sex: Female) Treating Leslie Jester Primary Care Provider: [name] Provider/Extender: G Referring Provider: Charolotte Capuchin in Treatment: 29 Verbal / Phone Orders: Yes Clinician: Ashok Cordia, Debi Read Back and Verified: Yes Diagnosis Coding Wound Cleansing Wound #20 Right Toe Great o Clean wound with Normal Saline. o Cleanse wound with mild soap and water o May shower with protection. Wound #9 Right Achilles o Clean wound with Normal Saline. o Cleanse wound with mild soap and water o May shower with protection. Anesthetic Wound #20 Right Toe Great o Topical Lidocaine 4% cream applied to wound bed prior to debridement - all wounds for clinic use only Wound #9 Right Achilles o Topical Lidocaine 4% cream applied to wound bed prior to debridement - all wounds for clinic use only Primary Wound Dressing Wound #20 Right Toe Great o Santyl Ointment Wound #9 Right Achilles o Hydrafera Blue Secondary Dressing Wound #20 Right Toe  Great o Dry Gauze o Kerlix and Coban o Foam Wound #9 Right Achilles o ABD pad [name], [name] ([id number]) o Dry Gauze Dressing Change Frequency Wound #20 Right Toe Great o Change dressing every other day. Wound #9 Right Achilles o Change dressing every other day. Follow-up Appointments Wound #20 Right Toe Great o Return Appointment in 1 week. Wound #9 Right Achilles o Return Appointment in 1 week. Edema Control Wound #9 Right Achilles o Kerlix and Coban - Right Lower Extremity - do not wrap too tight and not the whole leg o Elevate legs to the level of the heart and pump ankles as often as possible Off-Loading Wound #20 Right Toe Great o Other: - Float heels Wound #9 Right Achilles o Other: - Float heels Electronic Signature(s) Signed: 09/09/2016 4:54:24 PM  By: Alejandro Mulling Signed: 09/09/2016 5:13:56 PM By: Baltazar Najjar MD Entered By: Alejandro Mulling on 09/09/2016 08:50:06 [name] ([id number]) -------------------------------------------------------------------------------- Problem List Details Patient Name: MATIKA, BARTELL. Date of Service: 09/09/2016 8:00 AM Medical Record Patient Account Number: [id number] Number: Treating RN: Phillis Haggis 03/20/1934 (82 y.o. Other Clinician: Date of Birth/Sex: Female) Treating Hebe Merriwether Primary Care Provider: [name] Provider/Extender: G Referring Provider: Charolotte Capuchin in Treatment: 51 Active Problems ICD-10 Encounter Code Description Active Date Diagnosis E11.621 Type 2 diabetes mellitus with foot ulcer 12/17/2015 Yes L97.512 Non-pressure chronic ulcer of other part of right foot with 03/31/2016 Yes fat layer exposed E11.51 Type 2 diabetes mellitus with diabetic peripheral 12/17/2015 Yes angiopathy without gangrene Inactive Problems ICD-10 Code Description Active Date Inactive Date M86.671 Other chronic osteomyelitis, right ankle and foot  04/15/2016 04/15/2016 S31.104A Unspecified open wound of abdominal wall, left lower 12/17/2015 12/17/2015 quadrant without penetration into peritoneal cavity, initial encounter S31.103D Unspecified open wound of abdominal wall, right lower 04/15/2016 04/15/2016 quadrant without penetration into peritoneal cavity, subsequent encounter Resolved Problems Electronic Signature(s) [name], [name] ([id number]) Signed: 09/09/2016 5:13:56 PM By: Baltazar Najjar MD Entered By: Baltazar Najjar on 09/09/2016 12:49:11 [name] ([id number]) -------------------------------------------------------------------------------- Progress Note Details Patient Name: [name] Date of Service: 09/09/2016 8:00 AM Medical Record Patient Account Number: [id number] Number: Treating RN: Phillis Haggis Nov 22, 1933 (82 y.o. Other Clinician: Date of Birth/Sex: Female) Treating Aurea Aronov Primary Care Provider: [name] Provider/Extender: G Referring Provider: Charolotte Capuchin in Treatment: 38 Subjective History of Present Illness (HPI) The following HPI elements were documented for the patient's wound: Location: right posterior heel, right Achilles, right lower quadrant abdomen, right fourth toe amp site Quality: denies pain to any wound Severity: not applicable Timing: denies pain 83 year old patient who is known to be diabetic, was referred to [place] by Dr. Gavin Potters for a right heel ulceration which she's had for a while. She was recently in hospital for a pneumonia and at that time and got delirious and was disoriented and sometime during this time developed a stage II ulcer on her right heel. Her past medical history is significant for bilateral pneumonia which was treated with injectable antibiotics and then to oral Levaquin which he has completed. She also has acute on chronic diastolic CHF, acute on chronic respiratory failure, end-stage renal disease on hemodialysis,  atrial fibrillation, recent stroke, diabetes mellitus. The patient and her son are poor historians but from what I understand she was admitted to the hospital with an acute vascular compromise of her right lower extremity and Dr. Wyn Quaker has done a surgical procedure and we are trying to obtain these notes. There are also some vascular workup done and we will try and obtain these notes. the injury to the left lower quadrant of abdomen and the suprapubic area have been there due to a bruise and have been there for several months and no intervention has been done. 10/11/2015 -- on review of the electronics records it was noted that the patient was admitted to the hospital on 09/14/2015 with peripheral vascular disease with claudication, end-stage renal disease, pressure ulcer, chronic atrial fibrillation. She was seen by Dr. Wyn Quaker who did her right lower extremity angiogram , angioplasty of the right anterior tibial artery and thrombolysis with TPA of the right popliteal artery, and thrombectomy. She was seen by Dr. Wyn Quaker during this past week and he was pleased with the progress. He did say that if he took her to the  operating room for any procedure he would debride the abdominal wound under anesthesia. She was also seen by Dr. Ether Griffins the podiatrist who thought that she may lose her right fourth toe at some stage may need an amputation of this. 10/21/2015 --patient known to Dr. Wyn Quaker and his last office visit from 10/04/2015 has been reviewed. 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 She also had a persistent ulceration of the right heel and markedly this area and a right third and fourth toe and a small scab on the calf but these are dry and seemed to be improving. Patient also has a left carotid endarterectomy and multiple interventions to a right [name], [name]  ([id number]) brachiocephalic AV fistula. After the visit he had recommended noninvasive studies to recheck her revascularization. He was off the impression that she would likely lose the right fourth toe and the third toe was likely to heal. He was concerned about underlying muscle necrosis on her right heel and midfoot. 11/01/2015 -- an echo done in January of this year showed her left ventricular ejection fraction to be about 50-55%. The patient was seen by the PA and Dr. Driscilla Grammes office and the plan was to take her to the operating room soon to have a debridement under anesthesia for the abdominal wall wound, the Achilles tendon on the right leg and amputation of the right fourth toe. The daughter and the patient do not feel that they would be able to undergo hyperbaric oxygen therapy 5 days a week for 6 weeks. 12/17/15; this is a medically complex woman who I note was recently in this clinic however I was not involved with her care. She returns today with multiple wounds; a) she has a wound in the mid abdomen that is been there since March of this year. I note that she is been to the overall for debridement recently. The exact etiology of this wound is not really clear b) left lower quadrant abdominal wound had some sanguinous drainage when she came in here. The patient fell in January and thinks this may have been secondary to a hematoma. c): The patient has 3 wounds on her right leg including a small wound on the right mid calf, a large area over the Achilles which currently has a wound VAC for the last 6 weeks, also a smaller wound on the distal part of the right heel. As far as I understand most of these wounds are currently been dressed with's calcium alginate. According to her daughter the Achilles wound under the wound VAC is doing well d) the patient is had an amputation of her left fifth toe in January and the right fourth toe 6 weeks ago secondary to diabetic PAD e) the patient has  chronic renal failure on dialysis for the last 2 years secondary to type 2 diabetes on insulin. The daughter's knowledge there is been no biopsy of the abdominal wounds given their current appearance and lack of undefined etiology at have to wonder about calciphylaxis. 12/18/15:Addendum; I have reviewed [facility] I can see no relevant x-rays of the right heel. I note her arteriogram and revascularization of her right lower extremity in April 2017. She had debridement of both abdominal wounds and the right heel and Achilles wound on 11/07/15 at which time she had a right fourth toe ray amputation. The abdominal wounds were debridement again on 6/29. I do not see  any relevant pathology of these abdominal wounds 12/24/15; culture I did of the drainage from the midline abdominal wound last week showed both Proteus and ampicillin sensitive enterococcus. I've given her a course of Augmentin adjusted on dialysis days. She has no specific complaints today. Been using Santyl to the abdominal wounds in the right leg wound and the wound VAC on the right Achilles which was initially prescribed by Dr. dew 12/31/15; I have done two punch biopsies of the large midline abdominal. 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 May have been a trauma component of the one on the left lower quadrant however the midline wound had no such history. She has a large area on the right Achilles heel with a wound VAC prescribed by Dr. dew. A small wound on the right anterior leg.Marland Kitchen UNFORTUNATELY she has 2 new wounds today. One on the left heel which is probably a pressure area. As well her previous amputation site of her right fourth toe has dehisced and now has a small wound with significant depth at the amputation site. 01/14/2016 -- she returns after 2 weeks and had had a punch biopsy of abdominal wound done the last visit -- had a biopsy of her midline abdominal wound done and the Pathology diagnosis is that of ulceration, necrosis  and inflammation and negative for dysplasia and malignancy. 01/21/16. 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 In the meantime she has new wounds of the left heel the right fourth toe amputation site is opened up. The back is stopped to the right heel area. [name], [name] ([id number]) 01/28/16; the abdominal wounds continued to improve. The extensive wound on her right Achilles also looks stable except for the lower aspect of the wound where there is a large liquefied area that probes right down to her calcaneus. This cultured Proteus last week I have her on Augmentin and doxycycline 1. I think this is going to need a course of IV antibiotics and I will call dialysis. X-ray I did last week was negative, I think she is going to need an MRI 02/04/16; right heel MRI as before Saturday. Receiving I believe IV Rocephin at dialysis 02/11/16; as it turns out the patient could not have a MRI as she has a bladder stimulator in place even though it is not currently in use since the beginning of this year. Although she has an allergy to IV contrast she apparently has done well with premedication so we will have to go for a CT scan with contrast. In the meantime she has had a fall now has a large skin tear on her left upper arm. She went to the ER and they suggested Tegaderm over topical antibiotics 03/03/16 currently patient returns after having been hospitalized for 2 weeks and subsequently transferred to [facility] nursing facility. She actually seems to be doing excellent compared to even when we last saw her according to our nursing staff. Both patient and her daughter are extremely encouraged at how well she is presenting at this point in time. Overall the biggest issue is still the right Achilles area which is being managed at this point in time by Dr. Wyn Quaker. Patient is currently utilizing a wound VAC to that region. 03/17/16; patient is  at [facility] nursing home still. Using Aquacel Ag to the wounds on her bilateral feet and still Prisma to the small open area on her abdomen. 03/31/16 at this point in time patient has been tolerating  the dressing changes currently. She fortunately has no worsening of her symptoms although she tells me that the nurse who is caring for her at [facility] nursing facility decided that nothing was needed in regard to the lower abdominal wound from a dressing standpoint at this time. she is really not having significant discomfort or pain at this point she continues to have some tunneling in the proximal Achilles wound region. 04/07/16 patient continues to do well on evaluation today. Even the Achilles wound which has been the most tender is not giving her as much trouble. Unfortunately the PolyMem dressings that we utilize last week really did not seem to benefit her in particular. Obviously we will discontinue that at this point in time today. 04-15-16:Ms. Lavallee is accompanied today by her daughter. She is still residing in an SNF undergoing dialysis, continues to receive antibiotics during dialysis as prescribed by Dr. Sampson Goon of infectious disease. She has a follow-up appointment with Dr. Sampson Goon on 04-17-16 to discuss the continuation of these antibiotics. Ms. Recinos denies any pain to any of the 3 remaining wounds she does admit to changing to [employer] surgical shoes for safety while ambulating with physical therapy. She denies any falls since her last appointment here although she and her daughter do admit to increased tremors of unclear etiology since her last appointment. She has tolerated the dressing changes that were prescribed last week. 04-22-16 Ms. Leopard presents today accompanied by her daughter for evaluation of her diabetic foot ulcers. She is still residing in an SNF, she continues dialysis 3 times weekly. She'll follow up with Dr. Sampson Goon last Friday, and at that  appointment IV antibiotics were discontinued. According to Ms. Roudebush and her daughter if there is any regression of her wounds Dr. Sampson Goon will consider re-starting the antibiotics. Ms. Yeoman daughter states that since the discontinuation of antibiotic therapy her "twitches" have resolved. 04/29/16; I have not seen this patient in quite some time however she is completed triple antibiotic therapy given at dialysis for osteomyelitis as prescribed by infectious disease. She is still being followed by Dr. dew of vascular surgery. We are using Hydrofera Blue to the surface of these wounds. She currently has 2 open areas a substantial area over her Achilles area although this is a lot better than the last time I saw this. She also has a small wound superiorly from this wound which has a superior probing depth of 2 cm. 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 My biopsy did not prove this nevertheless I think this is the correct clinical diagnosis. We are now following her for an area on the right Achilles part of her heel. This is not any different from last week. She also has a small tunneling area just above this. And unfortunately this week there is been a reopening of an area where her right fourth toe was previously amputated. She is completed antibiotics 05/13/16; she has a new reopening on the mid abdominal wound in the same site is previously. This is a small open area. Apligraf #1 today to the areas on the right heel o2 still an open area in the fourth toe amputation site 05/27/16; the areas on her abdomen are completely closed over. Small open area from last time is closed. Apligraf #  2 today to the areas on the right heel. The 4th  toe amputation site is also heel 06/10/16; we did not have an Apligraf to put on today. This appeared tunneling wound on the right posterior calf appears to be closed. The more substantial area on her Achilles itself is improved. 06/24/16; I reapplied her third Apligraf today although we have not had one debridement last time. The original tunneling wound is not as closed as last time. The more substantial area on her Achilles itself is improved with advancing epithelialization 07/08/16 Apligraf #4 today. The original long area over her Achilles as a healthy-looking area at the superior aspect and a small divot inferiorly. Both of the wound surface is look healthy 07/22/16; patient arrived today with a 2 original wounds on the Achilles part of her ankle too small for another application of Apligraf. This is on the right Achilles area. The small divot inferiorly looks as though it may have skin over most of this and I can't really see an open area. She did have an erythematous area over the medial malleolus with some drainage this looks like a rapid injury not cellulitis 07/29/16; erythematous area over the medial malleolus seems a lot better this week. This was a wrap injury. The areas on the right Achilles is still a small divot. The area superiorly down somewhat in size. 08/05/16; condition is generally not feeling well. No major changed either wound. Using Prisma 08/19/16; patient was last here she was admitted to hospital last week. Apparently she was complaining mostly of abdominal pain however a CT scan of the abdomen and pelvis revealed changes in the L2-L3 interspace suspicious for discitis and osteomyelitis as well as changes in the adjacent paraspinal and psoas muscles. She was seen by infectious disease Dr. Sampson Goon. Her urine culture grew group B strep. An aspirate of the fluid at L2-L3 I reviewed today. Her AFB Gram stain was negative, fungal culture is still in place. C + S is negative so  far as of today. She is on vancomycin and ceftazidine dosed at dialysis for 6 weeks. She is not complaining of fever or back pain currently. We have been using [employer] and to her wounds 08/26/16; patient is still receiving IV antibiotics at dialysis. She does not feel well complaining of a lot of pain in the right hip radiating down her leg. She rubs the lateral aspect of the right hip over the greater trochanter. Her wound measurements were a lot of different this week although I don't think they were accurately recorded last week the wound on the right Achilles area looks about the same. Superficial without any depth. 09/09/16; patient is still receiving IV antibiotics at dialysis chide believe are vancomycin and ceftazidine. Since she was last here she is developed a new wound on the tip of her right great toe. The right Achilles wound had significant surface slough/necrotic material on arrival today Also of note the patient seems to be developing widespread myoclonic jerking which is gotten a lot worse over the last week to 2 although our intake nurse and the patient's daughter both state that she had had this at certain times in the past I had not specifically noticed this. Apparently at one point this was due to phosphorus abnormalities which were corrected at dialysis. I have reviewed her medication list I don't see any usual offenders except she is on Paxil Neurontin and Ultram although she takes the Ultram sparingly maybe once a week Weinman, Natalie Bergamo. (  [id number]) Objective Constitutional Sitting or standing Blood Pressure is within target range for patient.. Pulse regular and within target range for patient.Marland Kitchen Respirations regular, non-labored and within target range.. Temperature is normal and within the target range for the patient.. Patient looks somewhat fatigued but in general not in any distress. She is awake and conversational. Vitals Time Taken: 8:10 AM, Height: 65 in,  Weight: 160 lbs, BMI: 26.6, Pulse: 79 bpm, Respiratory Rate: 16 breaths/min, Blood Pressure: 106/52 mmHg. Eyes Conjunctivae clear. No discharge.Marland Kitchen Respiratory Respiratory effort is easy and symmetric bilaterally. Rate is normal at rest and on room air.. Cardiovascular Heart rhythm and rate regular, without murmur or gallop.. Gastrointestinal (GI) Abdomen is soft and non-distended without masses or tenderness. Bowel sounds active in all quadrants.. Psychiatric No evidence of depression, anxiety, or agitation. Calm, cooperative, and communicative. Appropriate interactions and affect.. General Notes: Exam; the area of the Achilles quire do reasonably extensive debridement of surface eschar and nonviable subcutaneous tissue. We used a scalpel and pickups. Post debridement the remnants of the wound actually looks quite healthy She has a new wound over the right great toe tip this is covered with adherent black eschar. I did not debridement this today. Integumentary (Hair, Skin) Wound #20 status is Open. Original cause of wound was Gradually Appeared. The wound is located on the Right Toe Great. The wound measures 1.2cm length x 0.6cm width x 0.1cm depth; 0.565cm^2 area and 0.057cm^3 volume. The wound is limited to skin breakdown. There is no tunneling or undermining noted. There is a none present amount of drainage noted. The wound margin is distinct with the outline attached to the wound base. There is no granulation within the wound bed. There is a large (67-100%) amount of necrotic tissue within the wound bed including Eschar. Periwound temperature was noted as No Abnormality. The periwound has tenderness on palpation. Wound #9 status is Open. Original cause of wound was Gradually Appeared. The wound is located on the Right Achilles. The wound measures 1cm length x 0.8cm width x 0.1cm depth; 0.628cm^2 area and 0.063cm^3 volume. There is Fat Layer (Subcutaneous Tissue) Exposed exposed. There  is no tunneling or undermining noted. There is a medium amount of serous drainage noted. The wound margin is distinct with [name], [name] ([id number]) the outline attached to the wound base. There is no granulation within the wound bed. There is a large (67- 100%) amount of necrotic tissue within the wound bed including Eschar and Adherent Slough. The periwound skin appearance exhibited: Excoriation. Periwound temperature was noted as No Abnormality. The periwound has tenderness on palpation. 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 There was a Skin/Subcutaneous Tissue Debridement ([id number]) debridement with total area of 0.8 sq cm performed by Maxwell Caul, MD. with the following instrument(s): Blade and Forceps to remove Viable and Non-Viable tissue/material including Exudate, Fibrin/Slough, and Subcutaneous after achieving pain control using Lidocaine 4% Topical Solution. A time out was conducted at 08:27, prior to the start of the procedure. A Minimum amount of bleeding was controlled with Pressure. The procedure was tolerated well with a pain level of 0 throughout and a pain level of 0 following the procedure. Post Debridement Measurements: 1cm length x  0.8cm width x 0.1cm depth; 0.063cm^3 volume. Character of Wound/Ulcer Post Debridement requires further debridement. Severity of Tissue Post Debridement is: Fat layer exposed. Post procedure Diagnosis Wound #9: Same as Pre-Procedure Plan Wound Cleansing: Wound #20 Right Toe Great: Clean wound with Normal Saline. Cleanse wound with mild soap and water May shower with protection. [name], [name]  ([id number]) Wound #9 Right Achilles: Clean wound with Normal Saline. Cleanse wound with mild soap and water May shower with protection. Anesthetic: Wound #20 Right Toe Great: Topical Lidocaine 4% cream applied to wound bed prior to debridement - all wounds for clinic use only Wound #9 Right Achilles: Topical Lidocaine 4% cream applied to wound bed prior to debridement - all wounds for clinic use only Primary Wound Dressing: Wound #20 Right Toe Great: Santyl Ointment Wound #9 Right Achilles: Hydrafera Blue Secondary Dressing: Wound #20 Right Toe Great: Dry Gauze Kerlix and Coban Foam Wound #9 Right Achilles: ABD pad Dry Gauze Dressing Change Frequency: Wound #20 Right Toe Great: Change dressing every other day. Wound #9 Right Achilles: Change dressing every other day. Follow-up Appointments: Wound #20 Right Toe Great: Return Appointment in 1 week. Wound #9 Right Achilles: Return Appointment in 1 week. Edema Control: Wound #9 Right Achilles: Kerlix and Coban - Right Lower Extremity - do not wrap too tight and not the whole leg Elevate legs to the level of the heart and pump ankles as often as possible Off-Loading: Wound #20 Right Toe Great: Other: - Float heels Wound #9 Right Achilles: Other: - Float heels #1 Hydrofera Blue to the right Achilles area YURIDIA, COUTS ([id number]) #2 Santyl to the right great toe. This looks like an ischemic wound all need to review what we know about her vascular status. She has been previously revascularized by vein and vascular #3 both of these dressings can be changed every other day at the nursing home #4 the patient appears to have widespread myoclonic irregular jerking of her upper and lower extremities. I don't see an obvious cause for this. This does not look like serotonin syndrome. I have asked them to make her nephrologist aware of this at dialysis so her metabolic status can be checked. I have reviewed all her medications  and I don't really see an obvious culprit here, as mentioned she is on Paxil Neurontin and very intermittent Ultram nevertheless none of this looks like a serotonin syndrome type presentation #5 Electronic Signature(s) Signed: 09/09/2016 5:13:56 PM By: Baltazar Najjar MD Entered By: Baltazar Najjar on 09/09/2016 12:56:52 [name] ([id number]) -------------------------------------------------------------------------------- SuperBill Details Patient Name: [name] Date of Service: 09/09/2016 Medical Record Patient Account Number: [id number] Number: Treating RN: Phillis Haggis 18-Nov-1933 (82 y.o. Other Clinician: Date of Birth/Sex: Female) Treating Camron Monday Primary Care Provider: [name] Provider/Extender: G Referring Provider: Charolotte Capuchin in Treatment: 38 Diagnosis Coding ICD-10 Codes Code Description E11.621 Type 2 diabetes mellitus with foot ulcer L97.512 Non-pressure chronic ulcer of other part of right foot with fat layer exposed E11.51 Type 2 diabetes mellitus with diabetic peripheral angiopathy without gangrene Facility Procedures CPT4 Code Description: [id number] - DEB SUBQ TISSUE 20 SQ CM/< ICD-10 Description Diagnosis E11.621 Type 2 diabetes mellitus with foot ulcer L97.512 Non-pressure chronic ulcer of other part of right fo Modifier: ot with fat la Quantity: 1 yer exposed Physician Procedures CPT4 Code Description: 9629528 11042 - WC PHYS SUBQ TISS 20 SQ CM ICD-10 Description Diagnosis E11.621 Type 2 diabetes mellitus with foot ulcer 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

## 2016-09-11 NOTE — Progress Notes (Signed)
[name], [name] ([id number]) Visit Report for 09/09/2016 Arrival Information Details Patient Name: [name], [name]. Date of Service: 09/09/2016 8:00 AM Medical Record Patient Account Number: [id number] Number: Treating RN: Phillis Haggis 07-Jan-1934 (83 y.o. Other Clinician: Date of Birth/Sex: Female) Treating ROBSON, MICHAEL Primary Care Zay Yeargan: [name] Zykerria Tanton/Extender: G Referring Yaman Grauberger: Charolotte Capuchin in Treatment: 36 Visit Information History Since Last Visit All ordered tests and consults were completed: No Patient Arrived: Wheel Chair Added or deleted any medications: No Arrival Time: 08:09 Any new allergies or adverse reactions: No Accompanied By: daughter Had a fall or experienced change in No Transfer Assistance: EasyPivot Patient activities of daily living that may affect Lift risk of falls: Patient Identification Verified: Yes Signs or symptoms of abuse/neglect since last No Secondary Verification Process Yes visito Completed: Hospitalized since last visit: No Patient Requires Transmission- No Has Dressing in Place as Prescribed: Yes Based Precautions: Pain Present Now: No Patient Has Alerts: Yes Patient Alerts: Patient on Blood Thinner DMII Warfarin ABI Aiken Bilateral NO BP RIGHT ARM Electronic Signature(s) Signed: 09/09/2016 4:54:24 PM By: Alejandro Mulling Entered By: Alejandro Mulling on 09/09/2016 08:09:52 [name] ([id number]) -------------------------------------------------------------------------------- Encounter Discharge Information Details Patient Name: [name]. Date of Service: 09/09/2016 8:00 AM Medical Record Patient Account Number: [id number] Number: Treating RN: Phillis Haggis 12/22/1933 (83 y.o. Other Clinician: Date of Birth/Sex: Female) Treating ROBSON, MICHAEL Primary Care Cadyn Rodger: [name] Annete Ayuso/Extender: G Referring Kyian Obst: Charolotte Capuchin in Treatment:  32 Encounter Discharge Information Items Discharge Pain Level: 0 Discharge Condition: Stable Ambulatory Status: Wheelchair Discharge Destination: Nursing Home Transportation: Private Auto Accompanied By: daughter Schedule Follow-up Appointment: Yes Medication Reconciliation completed and provided to Patient/Care No Ashla Murph: Provided on Clinical Summary of Care: 09/09/2016 Form Type Recipient Paper Patient [facility] Electronic Signature(s) Signed: 09/09/2016 9:00:48 AM By: Gwenlyn Perking Entered By: Gwenlyn Perking on 09/09/2016 09:00:48 [name] ([id number]) -------------------------------------------------------------------------------- Lower Extremity Assessment Details Patient Name: [name] Date of Service: 09/09/2016 8:00 AM Medical Record Patient Account Number: [id number] Number: Treating RN: Phillis Haggis Dec 22, 1933 (83 y.o. Other Clinician: Date of Birth/Sex: Female) Treating ROBSON, MICHAEL Primary Care Margurite Duffy: [name] Doriana Mazurkiewicz/Extender: G Referring Jermisha Hoffart: Charolotte Capuchin in Treatment: 38 Vascular Assessment Pulses: Dorsalis Pedis Palpable: [Right:Yes] Posterior Tibial Extremity colors, hair growth, and conditions: Extremity Color: [Right:Hyperpigmented] Temperature of Extremity: [Right:Warm] Capillary Refill: [Right:< 3 seconds] Toe Nail Assessment Left: Right: Thick: No Discolored: Yes Deformed: No Improper Length and Hygiene: Yes Electronic Signature(s) Signed: 09/09/2016 4:54:24 PM By: Alejandro Mulling Entered By: Alejandro Mulling on 09/09/2016 08:28:06 [name] ([id number]) -------------------------------------------------------------------------------- Multi Wound Chart Details Patient Name: [name] Date of Service: 09/09/2016 8:00 AM Medical Record Patient Account Number: [id number] Number: Treating RN: Phillis Haggis 1933/09/09 (83 y.o. 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/A Comorbid History: Arrhythmia, Congestive Arrhythmia, Congestive N/A Heart Failure, Heart Failure, Hypertension, Type II Hypertension, Type II Diabetes Diabetes Date Acquired: 09/09/2016 06/02/2015 N/A Weeks of Treatment: 0 38 N/A Wound Status: Open Open N/A Pending Amputation on No Yes N/A Presentation: Measurements L x  W x D 1.2x0.6x0.1 1x0.8x0.1 N/A (cm) Area (cm) : 0.565 0.628 N/A Volume (cm) : 0.057 0.063 N/A % Reduction in Area: N/A 98.10% N/A % Reduction in Volume: N/A 99.40% N/A Classification: Partial Thickness Grade 2 N/A [name], [name] ([id number]) HBO Classification: Grade 1 N/A N/A Exudate Amount: None Present Medium N/A Exudate Type: N/A Serous N/A Exudate Color: N/A amber N/A Wound Margin: Distinct, outline attached Distinct, outline attached N/A Granulation Amount: None Present (0%) None Present (0%) N/A Necrotic Amount: Large (67-100%) Large (67-100%) N/A Necrotic Tissue: Eschar Eschar, Adherent Slough N/A Exposed Structures: Fascia: No Fat Layer (Subcutaneous N/A Fat Layer (Subcutaneous Tissue) Exposed: Yes Tissue) Exposed: No Tendon: No Muscle: No Joint: No Bone: No Limited to Skin Breakdown Epithelialization: None Small (1-33%) N/A Debridement: N/A Debridement (40981- N/A 11047) Pre-procedure N/A 08:27 N/A Verification/Time Out Taken: Pain Control: N/A  Lidocaine 4% Topical N/A Solution Tissue Debrided: N/A Fibrin/Slough, Exudates, N/A Subcutaneous Level: N/A Skin/Subcutaneous N/A Tissue Debridement Area (sq N/A 0.8 N/A cm): Instrument: N/A Blade, Forceps N/A Bleeding: N/A Minimum N/A Hemostasis Achieved: N/A Pressure N/A Procedural Pain: N/A 0 N/A Post Procedural Pain: N/A 0 N/A Debridement Treatment N/A Procedure was tolerated N/A Response: well Post Debridement N/A 1x0.8x0.1 N/A Measurements L x W x D (cm) Post Debridement N/A 0.063 N/A Volume: (cm) Periwound Skin Texture: No Abnormalities Noted Excoriation: Yes N/A Periwound Skin No Abnormalities Noted No Abnormalities Noted N/A Moisture: Periwound Skin Color: No Abnormalities Noted No Abnormalities Noted N/A Temperature: No Abnormality No Abnormality N/A Yes Yes N/A [name], [name] ([id number]) Tenderness on Palpation: Wound Preparation: Ulcer Cleansing: Ulcer Cleansing: N/A Rinsed/Irrigated with Rinsed/Irrigated with Saline Saline, Other: soap and water Topical Anesthetic Applied: Other: lidocaine Topical Anesthetic 4% Applied: Other: lidocaine 4% Procedures Performed: N/A Debridement N/A Treatment Notes Wound #20 (Right Toe Great) 1. Cleansed with: Clean wound with Normal Saline 2. Anesthetic Topical Lidocaine 4% cream to wound bed prior to debridement 4. Dressing Applied: Santyl Ointment 5. Secondary Dressing Applied Dry Gauze Foam Kerlix/Conform 7. Secured with Tape Wound #9 (Right Achilles) 1. Cleansed with: Clean wound with Normal Saline 2. Anesthetic Topical Lidocaine 4% cream to wound bed prior to debridement 4. Dressing Applied: Hydrafera Blue 5. Secondary Dressing Applied ABD Pad Kerlix/Conform 7. Secured with Tape Notes kerlix, coban Electronic Signature(s) Signed: 09/09/2016 5:13:56 PM By: Baltazar Najjar MD Entered By: Baltazar Najjar on 09/09/2016 12:49:21 [name], [name] ([id number]) [name], [name]  ([id number]) -------------------------------------------------------------------------------- Multi-Disciplinary Care Plan Details Patient Name: [name], [name]. Date of Service: 09/09/2016 8:00 AM Medical Record Patient Account Number: [id number] Number: Treating RN: Phillis Haggis 1933/07/04 (82 y.o. Other Clinician: Date of Birth/Sex: Female) Treating ROBSON, MICHAEL Primary Care Briasia Flinders: [name] Johnrobert Foti/Extender: G Referring Aaralynn Shepheard: Charolotte Capuchin in Treatment: 76 Active Inactive ` Abuse / Safety / Falls / Self Care Management Nursing Diagnoses: Impaired physical mobility Potential for falls Goals: Patient will remain injury free Date Initiated: 12/17/2015 Target Resolution Date: 08/29/2016 Goal Status: Active Interventions: Assess fall risk on admission and as needed Notes: ` Necrotic Tissue Nursing Diagnoses: Impaired tissue integrity related to necrotic/devitalized tissue Knowledge deficit related to management of necrotic/devitalized tissue Goals: Necrotic/devitalized tissue will be minimized in the wound bed Date Initiated: 02/04/2016 Target Resolution Date: 08/29/2016 Goal Status: Active Interventions: Assess patient pain level pre-, during and post procedure and prior to discharge Provide education on necrotic tissue and debridement process Treatment Activities: Apply topical anesthetic as ordered : 02/04/2016 [name], [name] ([id number]) Notes: ` Wound/Skin Impairment Nursing Diagnoses: Impaired  tissue integrity Goals: Patient/caregiver will verbalize understanding of skin care regimen Date Initiated: 12/17/2015 Target Resolution Date: 08/29/2016 Goal Status: Active Ulcer/skin breakdown will have a volume reduction of 30% by week 4 Date Initiated: 12/17/2015 Target Resolution Date: 08/29/2016 Goal Status: Active Ulcer/skin breakdown will have a volume reduction of 50% by week 8 Date Initiated: 12/17/2015 Target Resolution Date:  08/29/2016 Goal Status: Active Ulcer/skin breakdown will have a volume reduction of 80% by week 12 Date Initiated: 12/17/2015 Target Resolution Date: 08/29/2016 Goal Status: Active Ulcer/skin breakdown will heal within 14 weeks Date Initiated: 12/17/2015 Target Resolution Date: 08/29/2016 Goal Status: Active Interventions: Assess patient/caregiver ability to obtain necessary supplies Assess patient/caregiver ability to perform ulcer/skin care regimen upon admission and as needed Assess ulceration(s) every visit Notes: Electronic Signature(s) Signed: 09/09/2016 4:54:24 PM By: Alejandro Mulling Entered By: Alejandro Mulling on 09/09/2016 08:28:12 [name] ([id number]) -------------------------------------------------------------------------------- Pain Assessment Details Patient Name: [name]. Date of Service: 09/09/2016 8:00 AM Medical Record Patient Account Number: [id number] Number: Treating RN: Phillis Haggis 1934/05/19 (82 y.o. Other Clinician: Date of Birth/Sex: Female) Treating ROBSON, MICHAEL Primary Care Deija Buhrman: [name] Yatziri Wainwright/Extender: G Referring Marri Mcneff: Charolotte Capuchin in Treatment: 59 Active Problems Location of Pain Severity and Description of Pain Patient Has Paino No Site Locations With Dressing Change: No Pain Management and Medication Current Pain Management: Electronic Signature(s) Signed: 09/09/2016 4:54:24 PM By: Alejandro Mulling Entered By: Alejandro Mulling on 09/09/2016 08:10:19 [name] ([id number]) -------------------------------------------------------------------------------- Patient/Caregiver Education Details Patient Name: [name]. Date of Service: 09/09/2016 8:00 AM Medical Record Patient Account Number: [id number] Number: Treating RN: Phillis Haggis 1934/01/17 (82 y.o. Other Clinician: Date of Birth/Gender: Female) Treating ROBSON, MICHAEL Primary Care Physician: [name] Physician/Extender: G Referring Physician: Charolotte Capuchin in Treatment: 13 Education Assessment Education Provided To: Patient Education Topics Provided Wound/Skin Impairment: Handouts: Other: change dressing as ordered Methods: Demonstration, Explain/Verbal Responses: State content correctly Electronic Signature(s) Signed: 09/09/2016 4:54:24 PM By: Alejandro Mulling Entered By: Alejandro Mulling on 09/09/2016 08:30:36 [name] ([id number]) -------------------------------------------------------------------------------- Wound Assessment Details Patient Name: [name]. Date of Service: 09/09/2016 8:00 AM Medical Record Patient Account Number: [id number] Number: Treating RN: Phillis Haggis 05/02/1934 (82 y.o. Other Clinician: Date of Birth/Sex: Female) Treating ROBSON, MICHAEL Primary Care Raney Koeppen: [name] Moneka Mcquinn/Extender: G Referring Anyae Griffith: Charolotte Capuchin in Treatment: 38 Wound Status Wound Number: 20 Primary Atypical Etiology: Wound Location: Right Toe Great Wound Open Wounding Event: Gradually Appeared Status: Date Acquired: 09/09/2016 Comorbid Arrhythmia, Congestive Heart Failure, Weeks Of Treatment: 0 History: Hypertension, Type II Diabetes Clustered Wound: No Photos Photo Uploaded By: Alejandro Mulling on 09/09/2016 09:42:07 Wound Measurements Length: (cm) 1.2 Width: (cm) 0.6 Depth: (cm) 0.1 Area: (cm) 0.565 Volume: (cm) 0.057 % Reduction in Area: % Reduction in Volume: Epithelialization: None Tunneling: No Undermining: No Wound Description Classification: Partial Thickness Foul O Diabetic Severity (Wagner): Grade 1 Slough Wound Margin: Distinct, outline attached Exudate Amount: None Present dor After Cleansing: No /Fibrino No Wound Bed Granulation Amount: None Present (0%) Exposed Structure Necrotic Amount: Large (67-100%) Fascia Exposed: No Necrotic Quality: Eschar Fat Layer (Subcutaneous  Tissue) Exposed: No Tendon Exposed: No [name], [name] ([id number]) Muscle Exposed: No Joint Exposed: No Bone Exposed: No Limited to Skin Breakdown Periwound Skin Texture Texture Color No Abnormalities Noted: No No Abnormalities Noted: No Moisture Temperature / Pain No Abnormalities Noted: No Temperature: No Abnormality Tenderness on Palpation: Yes Wound Preparation Ulcer Cleansing: Rinsed/Irrigated with Saline Topical Anesthetic Applied: Other: lidocaine 4%,  Treatment Notes Wound #20 (Right Toe Great) 1. Cleansed with: Clean wound with Normal Saline 2. Anesthetic Topical Lidocaine 4% cream to wound bed prior to debridement 4. Dressing Applied: Santyl Ointment 5. Secondary Dressing Applied Dry Gauze Foam Kerlix/Conform 7. Secured with Secretary/administrator) Signed: 09/09/2016 4:54:24 PM By: Alejandro Mulling Entered By: Alejandro Mulling on 09/09/2016 08:43:38 [name] ([id number]) -------------------------------------------------------------------------------- Wound Assessment Details Patient Name: [name] Date of Service: 09/09/2016 8:00 AM Medical Record Patient Account Number: [id number] Number: Treating RN: Phillis Haggis 1933/06/28 (82 y.o. Other Clinician: Date of Birth/Sex: Female) Treating ROBSON, MICHAEL Primary Care Kenneith Stief: [name] Skylynn Burkley/Extender: G Referring Nanako Stopher: Charolotte Capuchin in Treatment: 38 Wound Status Wound Number: 9 Primary Diabetic Wound/Ulcer of the Lower Etiology: Extremity Wound Location: Right Achilles Secondary Arterial Insufficiency Ulcer Wounding Event: Gradually Appeared Etiology: Date Acquired: 06/02/2015 Wound Open Weeks Of Treatment: 38 Status: Clustered Wound: No Comorbid Arrhythmia, Congestive Heart Pending Amputation On Presentation History: Failure, Hypertension, Type II Diabetes Photos Photo Uploaded By: Alejandro Mulling on 09/09/2016 09:42:22 Wound  Measurements Length: (cm) 1 Width: (cm) 0.8 Depth: (cm) 0.1 Area: (cm) 0.628 Volume: (cm) 0.063 % Reduction in Area: 98.1% % Reduction in Volume: 99.4% Epithelialization: Small (1-33%) Tunneling: No Undermining: No Wound Description Classification: Grade 2 Foul Odor After Wound Margin: Distinct, outline attached Slough/Fibrino Exudate Amount: Medium Exudate Type: Serous Exudate Color: amber Cleansing: No Yes Wound Bed [name], [name] ([id number]) Granulation Amount: None Present (0%) Exposed Structure Necrotic Amount: Large (67-100%) Fat Layer (Subcutaneous Tissue) Exposed: Yes Necrotic Quality: Eschar, Adherent Slough Periwound Skin Texture Texture Color No Abnormalities Noted: No No Abnormalities Noted: No Excoriation: Yes Temperature / Pain Moisture Temperature: No Abnormality No Abnormalities Noted: No Tenderness on Palpation: Yes Wound Preparation Ulcer Cleansing: Rinsed/Irrigated with Saline, Other: soap and water, Topical Anesthetic Applied: Other: lidocaine 4%, Treatment Notes Wound #9 (Right Achilles) 1. Cleansed with: Clean wound with Normal Saline 2. Anesthetic Topical Lidocaine 4% cream to wound bed prior to debridement 4. Dressing Applied: Hydrafera Blue 5. Secondary Dressing Applied ABD Pad Kerlix/Conform 7. Secured with Tape Notes kerlix, coban Electronic Signature(s) Signed: 09/09/2016 4:54:24 PM By: Alejandro Mulling Entered By: Alejandro Mulling on 09/09/2016 08:23:39 [name] ([id number]) -------------------------------------------------------------------------------- Vitals Details Patient Name: [name] Date of Service: 09/09/2016 8:00 AM Medical Record Patient Account Number: [id number] Number: Treating RN: Phillis Haggis [birth year] (82 y.o. Other Clinician: Date of Birth/Sex: Female) Treating ROBSON, MICHAEL Primary Care Jonelle Bann: [name] Kaida Games/Extender: G Referring Lyndie Vanderloop: Charolotte Capuchin in Treatment: 54 Vital Signs Time Taken: 08:10 Pulse (bpm): 79 Height (in): 65 Respiratory Rate (breaths/min): 16 Weight (lbs): 160 Blood Pressure (mmHg): 106/52 Body Mass Index (BMI): 26.6 Reference Range: 80 - 120 mg / dl Electronic Signature(s) Signed: 09/09/2016 4:54:24 PM By: Alejandro Mulling Entered By: Alejandro Mulling on 09/09/2016 08:12:20

## 2016-09-14 LAB — FUNGUS CULTURE WITH STAIN

## 2016-09-14 LAB — FUNGUS CULTURE RESULT

## 2016-09-14 LAB — FUNGAL ORGANISM REFLEX

## 2016-09-15 ENCOUNTER — Encounter: Payer: No Typology Code available for payment source | Admitting: Internal Medicine

## 2016-09-15 DIAGNOSIS — E11621 Type 2 diabetes mellitus with foot ulcer: Secondary | ICD-10-CM | POA: Diagnosis not present

## 2016-09-17 NOTE — Progress Notes (Signed)
[name] ([id number]) Visit Report for 09/15/2016 Chief Complaint Document Details Patient Name: [name], [name]. Date of Service: 09/15/2016 3:30 PM Medical Record Patient Account Number: [id number] Number: Treating RN: Angellee, Cohill Nov 01, 1933 (83 y.o. Other Clinician: Date of Birth/Sex: Female) Treating Trayquan Kolakowski Primary Care Provider: [name] Provider/Extender: G Referring Provider: Charolotte Capuchin in Treatment: 81 Information Obtained from: Patient Chief Complaint [name] presents today for follow-up evaluation of her diabetic foot ulcers and abdominal wound. Electronic Signature(s) Signed: 09/16/2016 7:57:33 AM By: Baltazar Najjar MD Entered By: Baltazar Najjar on 09/16/2016 07:49:28 [name] ([id number]) -------------------------------------------------------------------------------- Debridement Details Patient Name: [name]. Date of Service: 09/15/2016 3:30 PM Medical Record Patient Account Number: [id number] Number: Treating RN: Huel Coventry [birth year] (83 y.o. Other Clinician: Date of Birth/Sex: Female) Treating Oriel Ojo Primary Care Provider: [name] Provider/Extender: G Referring Provider: Charolotte Capuchin in Treatment: 81 Debridement Performed for Wound #20 Right Toe Great Assessment: Performed By: Physician Maxwell Caul, MD Debridement: Chemical/enzymatic Debridement Non-Selective Description: Pre-procedure No Verification/Time Out Taken: Procedural Pain: 0 Post Procedural Pain: 0 Response to Treatment: Procedure was tolerated well Post Debridement Measurements of Total Wound Length: (cm) 1.2 Width: (cm) 0.6 Depth: (cm) 0.1 Volume: (cm) 0.057 Character of Wound/Ulcer Post Requires Further Debridement Debridement: Severity of Tissue Post Debridement: Limited to breakdown of skin Post Procedure Diagnosis Same as Pre-procedure Electronic Signature(s) Signed:  09/15/2016 5:07:37 PM By: Elliot Gurney RN, BSN, Kim RN, BSN Signed: 09/16/2016 7:57:33 AM By: Baltazar Najjar MD Entered By: Elliot Gurney, RN, BSN, Kim on 09/15/2016 16:44:27 [name], [name] ([id number]) -------------------------------------------------------------------------------- HPI Details Patient Name: [name], [name]. Date of Service: 09/15/2016 3:30 PM Medical Record Patient Account Number: [id number] Number: Treating RN: Ayeza, Therriault 1933/10/08 (83 y.o. Other Clinician: Date of Birth/Sex: Female) Treating Veronika Heard Primary Care Provider: [name] Provider/Extender: G Referring Provider: Charolotte Capuchin in Treatment: 39 History of Present Illness Location: right posterior heel, right Achilles, right lower quadrant abdomen, right fourth toe amp site Quality: denies pain to any wound Severity: not applicable Timing: denies pain HPI Description: 83 year old patient who is known to be diabetic, was referred to [place] by Dr. Gavin Potters for a right heel ulceration which she's had for a while. She was recently in hospital for a pneumonia and at that time and got delirious and was disoriented and sometime during this time developed a stage II ulcer on her right heel. Her past medical history is significant for bilateral pneumonia which was treated with injectable antibiotics and then to oral Levaquin which he has completed. She also has acute on chronic diastolic CHF, acute on chronic respiratory failure, end-stage renal disease on hemodialysis, atrial fibrillation, recent stroke, diabetes mellitus. The patient and her son are poor historians but from what I understand she was admitted to the hospital with an acute vascular compromise of her right lower extremity and Dr. Wyn Quaker has done a surgical procedure and we are trying to obtain these notes. There are also some vascular workup done and we will try and obtain these notes. the injury to the left lower quadrant of abdomen and  the suprapubic area have been there due to a bruise and have been there for several months and no intervention has been done. 10/11/2015 -- on review of the electronics records it was noted that the patient was admitted to the hospital on 09/14/2015 with peripheral vascular disease with claudication, end-stage renal disease, pressure ulcer, chronic atrial fibrillation. She was seen by Dr.  Dew who did her right lower extremity angiogram , angioplasty of the right anterior tibial artery and thrombolysis with TPA of the right popliteal artery, and thrombectomy. She was seen by Dr. Wyn Quaker during this past week and he was pleased with the progress. He did say that if he took her to the operating room for any procedure he would debride the abdominal wound under anesthesia. She was also seen by Dr. Ether Griffins the podiatrist who thought that she may lose her right fourth toe at some stage may need an amputation of this. 10/21/2015 --patient known to Dr. Wyn Quaker and his last office visit from 10/04/2015 has been reviewed. 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 She also had a persistent ulceration of the right heel and markedly this area and a right third and fourth toe and a small scab on the calf but these are dry and seemed to be improving. Patient also has a left carotid endarterectomy and multiple interventions to a right brachiocephalic AV fistula. After the visit he had recommended noninvasive studies to recheck her revascularization. He was off the impression that she would likely lose the right fourth toe and the third toe was likely to heal. [name] ([id number]) He was concerned about underlying muscle necrosis on her right heel and midfoot. 11/01/2015 -- an echo done in January of this year showed her left ventricular ejection fraction to be about 50-55%. The patient was  seen by the PA and Dr. Driscilla Grammes office and the plan was to take her to the operating room soon to have a debridement under anesthesia for the abdominal wall wound, the Achilles tendon on the right leg and amputation of the right fourth toe. The daughter and the patient do not feel that they would be able to undergo hyperbaric oxygen therapy 5 days a week for 6 weeks. 12/17/15; this is a medically complex woman who I note was recently in this clinic however I was not involved with her care. She returns today with multiple wounds; a) she has a wound in the mid abdomen that is been there since March of this year. I note that she is been to the overall for debridement recently. The exact etiology of this wound is not really clear b) left lower quadrant abdominal wound had some sanguinous drainage when she came in here. The patient fell in January and thinks this may have been secondary to a hematoma. c): The patient has 3 wounds on her right leg including a small wound on the right mid calf, a large area over the Achilles which currently has a wound VAC for the last 6 weeks, also a smaller wound on the distal part of the right heel. As far as I understand most of these wounds are currently been dressed with's calcium alginate. According to her daughter the Achilles wound under the wound VAC is doing well d) the patient is had an amputation of her left fifth toe in January and the right fourth toe 6 weeks ago secondary to diabetic PAD e) the patient has chronic renal failure on dialysis for the last 2 years secondary to type 2 diabetes on insulin. The daughter's knowledge there is been no biopsy of the abdominal wounds given their current appearance and lack of undefined etiology at have to wonder about calciphylaxis. 12/18/15:Addendum; I have reviewed [facility] I can see no relevant x-rays of the right heel.  I note her arteriogram and revascularization of her right lower extremity in April 2017.  She had debridement of both abdominal wounds and the right heel and Achilles wound on 11/07/15 at which time she had a right fourth toe ray amputation. The abdominal wounds were debridement again on 6/29. I do not see any relevant pathology of these abdominal wounds 12/24/15; culture I did of the drainage from the midline abdominal wound last week showed both Proteus and ampicillin sensitive enterococcus. I've given her a course of Augmentin adjusted on dialysis days. She has no specific complaints today. Been using Santyl to the abdominal wounds in the right leg wound and the wound VAC on the right Achilles which was initially prescribed by Dr. dew 12/31/15; I have done two punch biopsies of the large midline abdominal. 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 May have been a trauma component of the one on the left lower quadrant however the midline wound had no such history. She has a large area on the right Achilles heel with a wound VAC prescribed by Dr. dew. A small wound on the right anterior leg.Marland Kitchen UNFORTUNATELY she has 2 new wounds today. One on the left heel which is probably a pressure area. As well her previous amputation site of her right fourth toe has dehisced and now has a small wound with significant depth at the amputation site. 01/14/2016 -- she returns after 2 weeks and had had a punch biopsy of abdominal wound done the last visit -- had a biopsy of her midline abdominal wound done and the Pathology diagnosis is that of ulceration, necrosis and inflammation and negative for dysplasia and malignancy. 01/21/16. 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 In the meantime she has new wounds of the left heel the right fourth toe amputation site is opened up. The back is stopped to the right heel area. 01/28/16; the abdominal wounds continued to improve. The extensive wound on her right Achilles also looks stable except for the  lower aspect of the wound where there is a large liquefied area that probes right down to her calcaneus. This cultured Proteus last week I have her on Augmentin and doxycycline 1. I think this is [name], [name] ([id number]) going to need a course of IV antibiotics and I will call dialysis. X-ray I did last week was negative, I think she is going to need an MRI 02/04/16; right heel MRI as before Saturday. Receiving I believe IV Rocephin at dialysis 02/11/16; as it turns out the patient could not have a MRI as she has a bladder stimulator in place even though it is not currently in use since the beginning of this year. Although she has an allergy to IV contrast she apparently has done well with premedication so we will have to go for a CT scan with contrast. In the meantime she has had a fall now has a large skin tear on her left upper arm. She went to the ER and they suggested Tegaderm over topical antibiotics 03/03/16 currently patient returns after having been hospitalized for 2 weeks and subsequently transferred to [facility] nursing facility. She actually seems to be doing excellent compared to even when we last saw her according to our nursing staff. Both patient and her daughter are extremely encouraged at how well she is presenting at this point in time. Overall the biggest issue is still the right Achilles area which is being managed at this point in  time by Dr. Wyn Quaker. Patient is currently utilizing a wound VAC to that region. 03/17/16; patient is at [facility] nursing home still. Using Aquacel Ag to the wounds on her bilateral feet and still Prisma to the small open area on her abdomen. 03/31/16 at this point in time patient has been tolerating the dressing changes currently. She fortunately has no worsening of her symptoms although she tells me that the nurse who is caring for her at [facility] nursing facility decided that nothing was needed in regard to the lower abdominal wound from a  dressing standpoint at this time. she is really not having significant discomfort or pain at this point she continues to have some tunneling in the proximal Achilles wound region. 04/07/16 patient continues to do well on evaluation today. Even the Achilles wound which has been the most tender is not giving her as much trouble. Unfortunately the PolyMem dressings that we utilize last week really did not seem to benefit her in particular. Obviously we will discontinue that at this point in time today. 04-15-16:Ms. Mullan is accompanied today by her daughter. She is still residing in an SNF undergoing dialysis, continues to receive antibiotics during dialysis as prescribed by Dr. Sampson Goon of infectious disease. She has a follow-up appointment with Dr. Sampson Goon on 04-17-16 to discuss the continuation of these antibiotics. Ms. Eastwood denies any pain to any of the 3 remaining wounds she does admit to changing to [employer] surgical shoes for safety while ambulating with physical therapy. She denies any falls since her last appointment here although she and her daughter do admit to increased tremors of unclear etiology since her last appointment. She has tolerated the dressing changes that were prescribed last week. 04-22-16 Ms. Christy presents today accompanied by her daughter for evaluation of her diabetic foot ulcers. She is still residing in an SNF, she continues dialysis 3 times weekly. She'll follow up with Dr. Sampson Goon last Friday, and at that appointment IV antibiotics were discontinued. According to Ms. Aten and her daughter if there is any regression of her wounds Dr. Sampson Goon will consider re-starting the antibiotics. Ms. Hobbins daughter states that since the discontinuation of antibiotic therapy her "twitches" have resolved. 04/29/16; I have not seen this patient in quite some time however she is completed triple antibiotic therapy given at dialysis for osteomyelitis as prescribed  by infectious disease. She is still being followed by Dr. dew of vascular surgery. We are using Hydrofera Blue to the surface of these wounds. She currently has 2 open areas a substantial area over her Achilles area although this is a lot better than the last time I saw this. She also has a small wound superiorly from this wound which has a superior probing depth of 2 cm. 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 My biopsy did not prove this nevertheless I think this is the correct clinical diagnosis. We are now following her for an area on the right Achilles part of her heel. This is not any different from last week. [name], [name] ([id number]) She also has a small tunneling area just above this. And unfortunately this week there is been a reopening of an area where her right fourth toe was previously amputated. She is completed antibiotics 05/13/16; she has a new reopening on the mid  abdominal wound in the same site is previously. This is a small open area. Apligraf #1 today to the areas on the right heel o2 still an open area in the fourth toe amputation site 05/27/16; the areas on her abdomen are completely closed over. Small open area from last time is closed. Apligraf #2 today to the areas on the right heel. The 4th toe amputation site is also heel 06/10/16; we did not have an Apligraf to put on today. This appeared tunneling wound on the right posterior calf appears to be closed. The more substantial area on her Achilles itself is improved. 06/24/16; I reapplied her third Apligraf today although we have not had one debridement last time. The original tunneling wound is not as closed as last time. The more substantial area on her Achilles itself is improved with  advancing epithelialization 07/08/16 Apligraf #4 today. The original long area over her Achilles as a healthy-looking area at the superior aspect and a small divot inferiorly. Both of the wound surface is look healthy 07/22/16; patient arrived today with a 2 original wounds on the Achilles part of her ankle too small for another application of Apligraf. This is on the right Achilles area. The small divot inferiorly looks as though it may have skin over most of this and I can't really see an open area. She did have an erythematous area over the medial malleolus with some drainage this looks like a rapid injury not cellulitis 07/29/16; erythematous area over the medial malleolus seems a lot better this week. This was a wrap injury. The areas on the right Achilles is still a small divot. The area superiorly down somewhat in size. 08/05/16; condition is generally not feeling well. No major changed either wound. Using Prisma 08/19/16; patient was last here she was admitted to hospital last week. Apparently she was complaining mostly of abdominal pain however a CT scan of the abdomen and pelvis revealed changes in the L2-L3 interspace suspicious for discitis and osteomyelitis as well as changes in the adjacent paraspinal and psoas muscles. She was seen by infectious disease Dr. Sampson Goon. Her urine culture grew group B strep. An aspirate of the fluid at L2-L3 I reviewed today. Her AFB Gram stain was negative, fungal culture is still in place. C + S is negative so far as of today. She is on vancomycin and ceftazidine dosed at dialysis for 6 weeks. She is not complaining of fever or back pain currently. We have been using [employer] and to her wounds 08/26/16; patient is still receiving IV antibiotics at dialysis. She does not feel well complaining of a lot of pain in the right hip radiating down her leg. She rubs the lateral aspect of the right hip over the greater trochanter. Her wound measurements were a  lot of different this week although I don't think they were accurately recorded last week the wound on the right Achilles area looks about the same. Superficial without any depth. 09/09/16; patient is still receiving IV antibiotics at dialysis chide believe are vancomycin and ceftazidine. Since she was last here she is developed a new wound on the tip of her right great toe. The right Achilles wound had significant surface slough/necrotic material on arrival today Also of note the patient seems to be developing widespread myoclonic jerking which is gotten a lot worse over the last week to 2 although our intake nurse and the patient's daughter both state that she had had this at certain times in the past  I had not specifically noticed this. Apparently at one point this was due to phosphorus abnormalities which were corrected at dialysis. I have reviewed her medication list I don't see any usual offenders except she is on Paxil Neurontin and Ultram although she takes the Ultram sparingly maybe once a week 09/15/16; still on vancomycin and ceftazidine as I understand things at dialysis. The area that was on the tip of her right great toe identified last week looks like an ischemic wound. She is known diabetic PAD and has had previous revascularization in 2017 by Dr. Wyn Quaker. It is possible she may need to see him again. I will probably attempt debridement of this next week we are using topical Santyl. In the meantime her heel wound has closed superiorly she is only left with a small wound at the inferior part of the Achilles area which was her deeper part of this wound at one point however even this area looks better [name], [name] ([id number]) Electronic Signature(s) Signed: 09/16/2016 7:57:33 AM By: Baltazar Najjar MD Entered By: Baltazar Najjar on 09/16/2016 07:54:10 [name] ([id number]) -------------------------------------------------------------------------------- Physical Exam  Details Patient Name: [name] Date of Service: 09/15/2016 3:30 PM Medical Record Patient Account Number: [id number] Number: Treating RN: Neriah, Brott 1933/07/31 (83 y.o. Other Clinician: Date of Birth/Sex: Female) Treating Lenus Trauger Primary Care Provider: [name] Provider/Extender: G Referring Provider: Charolotte Capuchin in Treatment: 42 Constitutional Patient is hypotensive. She looks well. Pulse regular and within target range for patient.Marland Kitchen Respirations regular, non-labored and within target range.. Temperature is normal and within the target range for the patient.. Patient's appearance is neat and clean. Appears in no acute distress. Well nourished and well developed.. Eyes Conjunctivae clear. No discharge.Marland Kitchen Respiratory Respiratory effort is easy and symmetric bilaterally. Rate is normal at rest and on room air.. Cardiovascular Femoral pulses are palpable. Dorsalis pedis pulses absent on the right. Lymphatic None palpable in the popliteal or inguinal area. Psychiatric No evidence of depression, anxiety, or agitation. Calm, cooperative, and communicative. Appropriate interactions and affect.. Notes Wound exam; the Achilles area does not require any debridement. There is only a small open area inferiorly, the superior part of this has healed. oThe new wound from last week on the right tip of her right great toe looks about the same adherent surface eschar. I'm going to use Santyl on this to see if we can loosen it up and plan for debridement at some point I don't think she would be an easy revascularization candidate Electronic Signature(s) Signed: 09/16/2016 7:57:33 AM By: Baltazar Najjar MD Entered By: Baltazar Najjar on 09/16/2016 07:53:41 [name] ([id number]) -------------------------------------------------------------------------------- Physician Orders Details Patient Name: [name]. Date of Service: 09/15/2016 3:30  PM Medical Record Patient Account Number: [id number] Number: Treating RN: Huel Coventry Apr 09, 1934 (83 y.o. Other Clinician: Date of Birth/Sex: Female) Treating Vallie Teters Primary Care Provider: [name] Provider/Extender: G Referring Provider: Charolotte Capuchin in Treatment: 80 Verbal / Phone Orders: No Diagnosis Coding Wound Cleansing Wound #20 Right Toe Great o Clean wound with Normal Saline. o Cleanse wound with mild soap and water o May shower with protection. Wound #9 Right Achilles o Clean wound with Normal Saline. o Cleanse wound with mild soap and water o May shower with protection. Anesthetic Wound #20 Right Toe Great o Topical Lidocaine 4% cream applied to wound bed prior to debridement - all wounds for clinic use only Wound #9 Right Achilles o Topical Lidocaine 4% cream applied to  wound bed prior to debridement - all wounds for clinic use only Primary Wound Dressing Wound #20 Right Toe Great o Santyl Ointment Wound #9 Right Achilles o Hydrafera Blue Secondary Dressing Wound #20 Right Toe Great o Dry Gauze o Kerlix and Coban o Foam Wound #9 Right Achilles o ABD pad [name], [name] ([id number]) o Dry Gauze Dressing Change Frequency Wound #20 Right Toe Great o Change dressing every other day. Wound #9 Right Achilles o Change dressing every other day. Follow-up Appointments Wound #20 Right Toe Great o Return Appointment in 1 week. Wound #9 Right Achilles o Return Appointment in 1 week. Edema Control Wound #9 Right Achilles o Kerlix and Coban - Right Lower Extremity - do not wrap too tight and not the whole leg o Elevate legs to the level of the heart and pump ankles as often as possible Off-Loading Wound #20 Right Toe Great o Other: - Float heels Wound #9 Right Achilles o Other: - Float heels Electronic Signature(s) Signed: 09/15/2016 5:07:37 PM By: Elliot Gurney, RN, BSN, Kim RN,  BSN Signed: 09/16/2016 7:57:33 AM By: Baltazar Najjar MD Entered By: Elliot Gurney, RN, BSN, Kim on 09/15/2016 16:24:21 [name], [name] ([id number]) -------------------------------------------------------------------------------- Problem List Details Patient Name: [name], [name]. Date of Service: 09/15/2016 3:30 PM Medical Record Patient Account Number: [id number] Number: Treating RN: Shelvia, Fojtik 1933-10-10 (83 y.o. Other Clinician: Date of Birth/Sex: Female) Treating Donyell Carrell Primary Care Provider: [name] Provider/Extender: G Referring Provider: Charolotte Capuchin in Treatment: 39 Active Problems ICD-10 Encounter Code Description Active Date Diagnosis E11.621 Type 2 diabetes mellitus with foot ulcer 12/17/2015 Yes L97.512 Non-pressure chronic ulcer of other part of right foot with 03/31/2016 Yes fat layer exposed E11.51 Type 2 diabetes mellitus with diabetic peripheral 12/17/2015 Yes angiopathy without gangrene Inactive Problems ICD-10 Code Description Active Date Inactive Date M86.671 Other chronic osteomyelitis, right ankle and foot 04/15/2016 04/15/2016 S31.104A Unspecified open wound of abdominal wall, left lower 12/17/2015 12/17/2015 quadrant without penetration into peritoneal cavity, initial encounter S31.103D Unspecified open wound of abdominal wall, right lower 04/15/2016 04/15/2016 quadrant without penetration into peritoneal cavity, subsequent encounter Resolved Problems Electronic Signature(s) FLANNERY, CAVALLERO ([id number]) Signed: 09/16/2016 7:57:33 AM By: Baltazar Najjar MD Entered By: Baltazar Najjar on 09/16/2016 07:49:07 [name] ([id number]) -------------------------------------------------------------------------------- Progress Note Details Patient Name: [name] Date of Service: 09/15/2016 3:30 PM Medical Record Patient Account Number: [id number] Number: Treating RN: Breiona, Couvillon 02/11/1934 (82 y.o.  Other Clinician: Date of Birth/Sex: Female) Treating Deidrick Rainey Primary Care Provider: [name] Provider/Extender: G Referring Provider: Charolotte Capuchin in Treatment: 105 Subjective Chief Complaint Information obtained from Patient Ms. Oppedisano presents today for follow-up evaluation of her diabetic foot ulcers and abdominal wound. History of Present Illness (HPI) The following HPI elements were documented for the patient's wound: Location: right posterior heel, right Achilles, right lower quadrant abdomen, right fourth toe amp site Quality: denies pain to any wound Severity: not applicable Timing: denies pain 83 year old patient who is known to be diabetic, was referred to [place] by Dr. Gavin Potters for a right heel ulceration which she's had for a while. She was recently in hospital for a pneumonia and at that time and got delirious and was disoriented and sometime during this time developed a stage II ulcer on her right heel. Her past medical history is significant for bilateral pneumonia which was treated with injectable antibiotics and then to oral Levaquin which he has completed. She also has acute on chronic diastolic CHF, acute on  chronic respiratory failure, end-stage renal disease on hemodialysis, atrial fibrillation, recent stroke, diabetes mellitus. The patient and her son are poor historians but from what I understand she was admitted to the hospital with an acute vascular compromise of her right lower extremity and Dr. Wyn Quaker has done a surgical procedure and we are trying to obtain these notes. There are also some vascular workup done and we will try and obtain these notes. the injury to the left lower quadrant of abdomen and the suprapubic area have been there due to a bruise and have been there for several months and no intervention has been done. 10/11/2015 -- on review of the electronics records it was noted that the patient was admitted to the hospital on  09/14/2015 with peripheral vascular disease with claudication, end-stage renal disease, pressure ulcer, chronic atrial fibrillation. She was seen by Dr. Wyn Quaker who did her right lower extremity angiogram , angioplasty of the right anterior tibial artery and thrombolysis with TPA of the right popliteal artery, and thrombectomy. She was seen by Dr. Wyn Quaker during this past week and he was pleased with the progress. He did say that if he took her to the operating room for any procedure he would debride the abdominal wound under anesthesia. She was also seen by Dr. Ether Griffins the podiatrist who thought that she may lose her right fourth toe at some stage may need an amputation of this. [name], [name] ([id number]) 10/21/2015 --patient known to Dr. Wyn Quaker and his last office visit from 10/04/2015 has been reviewed. 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 She also had a persistent ulceration of the right heel and markedly this area and a right third and fourth toe and a small scab on the calf but these are dry and seemed to be improving. Patient also has a left carotid endarterectomy and multiple interventions to a right brachiocephalic AV fistula. After the visit he had recommended noninvasive studies to recheck her revascularization. He was off the impression that she would likely lose the right fourth toe and the third toe was likely to heal. He was concerned about underlying muscle necrosis on her right heel and midfoot. 11/01/2015 -- an echo done in January of this year showed her left ventricular ejection fraction to be about 50-55%. The patient was seen by the PA and Dr. Driscilla Grammes office and the plan was to take her to the operating room soon to have a debridement under anesthesia for the abdominal wall wound, the Achilles tendon on the right leg and amputation of the right fourth  toe. The daughter and the patient do not feel that they would be able to undergo hyperbaric oxygen therapy 5 days a week for 6 weeks. 12/17/15; this is a medically complex woman who I note was recently in this clinic however I was not involved with her care. She returns today with multiple wounds; a) she has a wound in the mid abdomen that is been there since March of this year. I note that she is been to the overall for debridement recently. The exact etiology of this wound is not really clear b) left lower quadrant abdominal wound had some sanguinous drainage when she came in here. The patient fell in January and thinks this may have been secondary to a hematoma. c): The patient has 3 wounds on her right leg including a small wound on the right mid calf, a  large area over the Achilles which currently has a wound VAC for the last 6 weeks, also a smaller wound on the distal part of the right heel. As far as I understand most of these wounds are currently been dressed with's calcium alginate. According to her daughter the Achilles wound under the wound VAC is doing well d) the patient is had an amputation of her left fifth toe in January and the right fourth toe 6 weeks ago secondary to diabetic PAD e) the patient has chronic renal failure on dialysis for the last 2 years secondary to type 2 diabetes on insulin. The daughter's knowledge there is been no biopsy of the abdominal wounds given their current appearance and lack of undefined etiology at have to wonder about calciphylaxis. 12/18/15:Addendum; I have reviewed [facility] I can see no relevant x-rays of the right heel. I note her arteriogram and revascularization of her right lower extremity in April 2017. She had debridement of both abdominal wounds and the right heel and Achilles wound on 11/07/15 at which time she had a right fourth toe ray amputation. The abdominal wounds were debridement again on 6/29. I do not see any relevant  pathology of these abdominal wounds 12/24/15; culture I did of the drainage from the midline abdominal wound last week showed both Proteus and ampicillin sensitive enterococcus. I've given her a course of Augmentin adjusted on dialysis days. She has no specific complaints today. Been using Santyl to the abdominal wounds in the right leg wound and the wound VAC on the right Achilles which was initially prescribed by Dr. dew 12/31/15; I have done two punch biopsies of the large midline abdominal. 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 May have been a trauma component of the one on the left lower quadrant however the midline wound had no such history. She has a large area on the right Achilles heel with a wound VAC prescribed by Dr. dew. A small wound on the right anterior leg.Marland Kitchen UNFORTUNATELY she has 2 new wounds today. One on the left heel which is probably a pressure area. As well her previous amputation site of her right fourth toe has dehisced and now has a small wound with significant depth at the amputation site. 01/14/2016 -- she returns after 2 weeks and had had a punch biopsy of abdominal wound done the last visit ELLIONNA, BUCKBEE ([id number]) -- had a biopsy of her midline abdominal wound done and the Pathology diagnosis is that of ulceration, necrosis and inflammation and negative for dysplasia and malignancy. 01/21/16. 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 In the meantime she has new wounds of the left heel the right fourth toe amputation site is opened up. The back is stopped to the right heel area. 01/28/16; the abdominal wounds continued to improve. The extensive wound on her right Achilles also looks stable except for the lower aspect of the wound where there is a large liquefied area that probes right down to her calcaneus. This cultured Proteus last week I have her on Augmentin and doxycycline 1. I think this  is going to need a course of IV antibiotics and I will call dialysis. X-ray I did last week was negative, I think she is going to need an MRI 02/04/16; right heel MRI as before Saturday. Receiving I believe IV Rocephin at dialysis 02/11/16; as it turns out the patient could not have a MRI as she has a bladder stimulator in  place even though it is not currently in use since the beginning of this year. Although she has an allergy to IV contrast she apparently has done well with premedication so we will have to go for a CT scan with contrast. In the meantime she has had a fall now has a large skin tear on her left upper arm. She went to the ER and they suggested Tegaderm over topical antibiotics 03/03/16 currently patient returns after having been hospitalized for 2 weeks and subsequently transferred to [facility] nursing facility. She actually seems to be doing excellent compared to even when we last saw her according to our nursing staff. Both patient and her daughter are extremely encouraged at how well she is presenting at this point in time. Overall the biggest issue is still the right Achilles area which is being managed at this point in time by Dr. Wyn Quaker. Patient is currently utilizing a wound VAC to that region. 03/17/16; patient is at [facility] nursing home still. Using Aquacel Ag to the wounds on her bilateral feet and still Prisma to the small open area on her abdomen. 03/31/16 at this point in time patient has been tolerating the dressing changes currently. She fortunately has no worsening of her symptoms although she tells me that the nurse who is caring for her at [facility] nursing facility decided that nothing was needed in regard to the lower abdominal wound from a dressing standpoint at this time. she is really not having significant discomfort or pain at this point she continues to have some tunneling in the proximal Achilles wound region. 04/07/16 patient continues to do well on evaluation  today. Even the Achilles wound which has been the most tender is not giving her as much trouble. Unfortunately the PolyMem dressings that we utilize last week really did not seem to benefit her in particular. Obviously we will discontinue that at this point in time today. 04-15-16:Ms. Karras is accompanied today by her daughter. She is still residing in an SNF undergoing dialysis, continues to receive antibiotics during dialysis as prescribed by Dr. Sampson Goon of infectious disease. She has a follow-up appointment with Dr. Sampson Goon on 04-17-16 to discuss the continuation of these antibiotics. Ms. Arena denies any pain to any of the 3 remaining wounds she does admit to changing to [employer] surgical shoes for safety while ambulating with physical therapy. She denies any falls since her last appointment here although she and her daughter do admit to increased tremors of unclear etiology since her last appointment. She has tolerated the dressing changes that were prescribed last week. 04-22-16 Ms. Vanterpool presents today accompanied by her daughter for evaluation of her diabetic foot ulcers. She is still residing in an SNF, she continues dialysis 3 times weekly. She'll follow up with Dr. Sampson Goon last Friday, and at that appointment IV antibiotics were discontinued. According to Ms. Honse and her daughter if there is any regression of her wounds Dr. Sampson Goon will consider re-starting the antibiotics. Ms. Fiebig daughter states that since the discontinuation of antibiotic therapy her "twitches" have resolved. 04/29/16; I have not seen this patient in quite some time however she is completed triple antibiotic therapy given at dialysis for osteomyelitis as prescribed by infectious disease. She is still being followed by Dr. Wyn Quaker [name] ([id number]) of vascular surgery. We are using Hydrofera Blue to the surface of these wounds. She currently has 2 open areas a substantial area over her  Achilles area although this is a lot better than the  last time I saw this. She also has a small wound superiorly from this wound which has a superior probing depth of 2 cm. 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 My biopsy did not prove this nevertheless I think this is the correct clinical diagnosis. We are now following her for an area on the right Achilles part of her heel. This is not any different from last week. She also has a small tunneling area just above this. And unfortunately this week there is been a reopening of an area where her right fourth toe was previously amputated. She is completed antibiotics 05/13/16; she has a new reopening on the mid abdominal wound in the same site is previously. This is a small open area. Apligraf #1 today to the areas on the right heel o2 still an open area in the fourth toe amputation site 05/27/16; the areas on her abdomen are completely closed over. Small open area from last time is closed. Apligraf #2 today to the areas on the right heel. The 4th toe amputation site is also heel 06/10/16; we did not have an Apligraf to put on today. This appeared tunneling wound on the right posterior calf appears to be closed. The more substantial area on her Achilles itself is improved. 06/24/16; I reapplied her third Apligraf today although we have not had one debridement last time. The original tunneling wound is not as closed as last time. The more substantial area on her Achilles itself is improved with advancing epithelialization 07/08/16 Apligraf #4 today. The original long area over her Achilles as a healthy-looking area at the superior aspect and a small divot inferiorly. Both of the wound surface is look healthy 07/22/16; patient  arrived today with a 2 original wounds on the Achilles part of her ankle too small for another application of Apligraf. This is on the right Achilles area. The small divot inferiorly looks as though it may have skin over most of this and I can't really see an open area. She did have an erythematous area over the medial malleolus with some drainage this looks like a rapid injury not cellulitis 07/29/16; erythematous area over the medial malleolus seems a lot better this week. This was a wrap injury. The areas on the right Achilles is still a small divot. The area superiorly down somewhat in size. 08/05/16; condition is generally not feeling well. No major changed either wound. Using Prisma 08/19/16; patient was last here she was admitted to hospital last week. Apparently she was complaining mostly of abdominal pain however a CT scan of the abdomen and pelvis revealed changes in the L2-L3 interspace suspicious for discitis and osteomyelitis as well as changes in the adjacent paraspinal and psoas muscles. She was seen by infectious disease Dr. Sampson Goon. Her urine culture grew group B strep. An aspirate of the fluid at L2-L3 I reviewed today. Her AFB Gram stain was negative, fungal culture is still in place. C + S is negative so far as of today. She is on vancomycin and ceftazidine dosed at dialysis for 6 weeks. She is not complaining of fever or back pain currently. We have been using [employer] and to her wounds 08/26/16; patient is still receiving IV antibiotics at dialysis. She does not feel well complaining of a lot of pain  in the right hip radiating down her leg. She rubs the lateral aspect of the right hip over the greater trochanter. Her wound measurements were a lot of different this week although I don't think they were accurately recorded last week the wound on the right Achilles area looks about the same. Superficial without any depth. 09/09/16; patient is still receiving IV antibiotics  at dialysis chide believe are vancomycin and ceftazidine. Since she was last here she is developed a new wound on the tip of her right great toe. The right Achilles wound had significant surface slough/necrotic material on arrival today Also of note the patient seems to be developing widespread myoclonic jerking which is gotten a lot worse over the last week to 2 although our intake nurse and the patient's daughter both state that she had had this at certain times in the past I had not specifically noticed this. Apparently at one point this was due to phosphorus abnormalities which were corrected at dialysis. I have reviewed her medication list I don't see any usual offenders except she is on Paxil Neurontin and Ultram although she takes the Ultram sparingly [name], [name] ([id number]) maybe once a week 09/15/16; still on vancomycin and ceftazidine as I understand things at dialysis. The area that was on the tip of her right great toe identified last week looks like an ischemic wound. She is known diabetic PAD and has had previous revascularization in 2017 by Dr. Wyn Quaker. It is possible she may need to see him again. I will probably attempt debridement of this next week we are using topical Santyl. In the meantime her heel wound has closed superiorly she is only left with a small wound at the inferior part of the Achilles area which was her deeper part of this wound at one point however even this area looks better Objective Constitutional Patient is hypotensive. She looks well. Pulse regular and within target range for patient.Marland Kitchen Respirations regular, non-labored and within target range.. Temperature is normal and within the target range for the patient.. Patient's appearance is neat and clean. Appears in no acute distress. Well nourished and well developed.. Vitals Time Taken: 3:43 PM, Height: 65 in, Weight: 160 lbs, BMI: 26.6, Temperature: 98.0 F, Pulse: 79 bpm, Respiratory Rate: 16  breaths/min, Blood Pressure: 98/46 mmHg. Eyes Conjunctivae clear. No discharge.Marland Kitchen Respiratory Respiratory effort is easy and symmetric bilaterally. Rate is normal at rest and on room air.. Cardiovascular Femoral pulses are palpable. Dorsalis pedis pulses absent on the right. Lymphatic None palpable in the popliteal or inguinal area. Psychiatric No evidence of depression, anxiety, or agitation. Calm, cooperative, and communicative. Appropriate interactions and affect.. General Notes: Wound exam; the Achilles area does not require any debridement. There is only a small open area inferiorly, the superior part of this has healed. The new wound from last week on the right tip of her right great toe looks about the same adherent surface eschar. I'm going to use Santyl on this to see if we can loosen it up and plan for debridement at some point I don't think she would be an easy revascularization candidate Integumentary (Hair, Skin) Wound #20 status is Open. Original cause of wound was Gradually Appeared. The wound is located on the Dorothy. ([id number]) Right Toe Great. The wound measures 1.1cm length x 1cm width x 0.1cm depth; 0.864cm^2 area and 0.086cm^3 volume. Wound #9 status is Open. Original cause of wound was Gradually Appeared. The wound is located on the Right Achilles. The wound  measures 2cm length x 0.8cm width x 0.1cm depth; 1.257cm^2 area and 0.126cm^3 volume. Assessment Active Problems ICD-10 E11.621 - Type 2 diabetes mellitus with foot ulcer L97.512 - Non-pressure chronic ulcer of other part of right foot with fat layer exposed E11.51 - Type 2 diabetes mellitus with diabetic peripheral angiopathy without gangrene Procedures Wound #20 Wound #20 is an Atypical located on the Right Toe Great . There was a Non-Selective Chemical/enzymatic debridement (non-viable tissue was removed) performed by Maxwell Caul, MD.. A time out was not conducted prior to the start of  the procedure. The procedure was tolerated well with a pain level of 0 throughout and a pain level of 0 following the procedure. Post Debridement Measurements: 1.2cm length x 0.6cm width x 0.1cm depth; 0.057cm^3 volume. Character of Wound/Ulcer Post Debridement requires further debridement. Severity of Tissue Post Debridement is: Limited to breakdown of skin. Post procedure Diagnosis Wound #20: Same as Pre-Procedure Plan Wound Cleansing: Wound #20 Right Toe Great: Clean wound with Normal Saline. Cleanse wound with mild soap and water May shower with protection. Wound #9 Right Achilles: EURIKA, SANDY ([id number]) Clean wound with Normal Saline. Cleanse wound with mild soap and water May shower with protection. Anesthetic: Wound #20 Right Toe Great: Topical Lidocaine 4% cream applied to wound bed prior to debridement - all wounds for clinic use only Wound #9 Right Achilles: Topical Lidocaine 4% cream applied to wound bed prior to debridement - all wounds for clinic use only Primary Wound Dressing: Wound #20 Right Toe Great: Santyl Ointment Wound #9 Right Achilles: Hydrafera Blue Secondary Dressing: Wound #20 Right Toe Great: Dry Gauze Kerlix and Coban Foam Wound #9 Right Achilles: ABD pad Dry Gauze Dressing Change Frequency: Wound #20 Right Toe Great: Change dressing every other day. Wound #9 Right Achilles: Change dressing every other day. Follow-up Appointments: Wound #20 Right Toe Great: Return Appointment in 1 week. Wound #9 Right Achilles: Return Appointment in 1 week. Edema Control: Wound #9 Right Achilles: Kerlix and Coban - Right Lower Extremity - do not wrap too tight and not the whole leg Elevate legs to the level of the heart and pump ankles as often as possible Off-Loading: Wound #20 Right Toe Great: Other: - Float heels Wound #9 Right Achilles: Other: - Float heels #1 we continue to make remarkable progress on the very difficult wound on her  Achilles area which was a surgical wound and infected at one point. using foam under Kerlix and DEANDRA, GOERING ([id number]) #2 continue with Santyl to the right great toe. I'm concerned about this being a ischemic wound however in spite of this I may attempt a limited debridement of this area next week. Her amputation site at the fourth toe remains closed Electronic Signature(s) Signed: 09/16/2016 7:57:33 AM By: Baltazar Najjar MD Entered By: Baltazar Najjar on 09/16/2016 07:55:51 Nechuma, Boven Vinetta [name] ([id number]) -------------------------------------------------------------------------------- SuperBill Details Patient Name: [name] Date of Service: 09/15/2016 Medical Record Patient Account Number: [id number] Number: Treating RN: Keishia, Ground 01-21-1934 (83 y.o. Other Clinician: Date of Birth/Sex: Female) Treating Yaslin Kirtley Primary Care Provider: [name] Provider/Extender: G Referring Provider: Charolotte Capuchin in Treatment: 39 Diagnosis Coding ICD-10 Codes Code Description E11.621 Type 2 diabetes mellitus with foot ulcer L97.512 Non-pressure chronic ulcer of other part of right foot with fat layer exposed E11.51 Type 2 diabetes mellitus with diabetic peripheral angiopathy without gangrene Facility Procedures CPT4 Code: [id number] Description: 96295 - DEBRIDE W/O ANES NON SELECT Modifier: Quantity: 1 Physician Procedures CPT4:  Description Modifier Quantity Code [id number] - WC PHYS LEVEL 3 - EST PT 1 ICD-10 Description Diagnosis E11.621 Type 2 diabetes mellitus with foot ulcer L97.512 Non-pressure chronic ulcer of other part of right foot with fat layer exposed E11.51  Type 2 diabetes mellitus with diabetic peripheral angiopathy without gangrene Electronic Signature(s) Signed: 09/16/2016 7:57:33 AM By: Baltazar Najjar MD Entered By: Baltazar Najjar on 09/16/2016 07:56:25

## 2016-09-17 NOTE — Progress Notes (Signed)
EMIE, SOMMERFELD ([id number]) Visit Report for 09/15/2016 Arrival Information Details Patient Name: [name], [name]. Date of Service: 09/15/2016 3:30 PM Medical Record Patient Account Number: [id number] Number: Treating RN: [name] [birth year] (83 y.o. Other Clinician: Date of Birth/Sex: Female) Treating ROBSON, MICHAEL Primary Care Cinderella Christoffersen: [name] Nonnie Pickney/Extender: G Referring Charla Criscione: Charolotte Capuchin in Treatment: 51 Visit Information History Since Last Visit Added or deleted any medications: No Patient Arrived: Wheel Chair Any new allergies or adverse reactions: No Arrival Time: 15:41 Had a fall or experienced change in No Accompanied By: daughter activities of daily living that may affect Transfer Assistance: Manual risk of falls: Patient Identification Verified: Yes Hospitalized since last visit: No Secondary Verification Process Yes Has Dressing in Place as Prescribed: Yes Completed: Pain Present Now: No Patient Requires Transmission- No Based Precautions: Patient Has Alerts: Yes Patient Alerts: Patient on Blood Thinner DMII Warfarin ABI Red Bud Bilateral NO BP RIGHT ARM Electronic Signature(s) Signed: 09/15/2016 5:07:37 PM By: Elliot Gurney, RN, BSN, Kim RN, BSN Entered By: Elliot Gurney, RN, BSN, Kim on 09/15/2016 15:42:34 [name] ([id number]) -------------------------------------------------------------------------------- Encounter Discharge Information Details Patient Name: [name]. Date of Service: 09/15/2016 3:30 PM Medical Record Patient Account Number: [id number] Number: Treating RN: [place], Brogden [birth year] (83 y.o. Other Clinician: Date of Birth/Sex: Female) Treating ROBSON, MICHAEL Primary Care Kdyn Vonbehren: [name] Kasey Hansell/Extender: G Referring Mali Eppard: Charolotte Capuchin in Treatment: 60 Encounter Discharge Information Items Discharge Pain Level: 0 Discharge Condition: Stable Ambulatory Status:  Wheelchair Discharge Destination: Home Transportation: Private Auto Accompanied By: daughter Schedule Follow-up Appointment: Yes Medication Reconciliation completed and provided to Patient/Care Yes Keiva Dina: Provided on Clinical Summary of Care: 09/15/2016 Form Type Recipient Paper Patient [facility] Electronic Signature(s) Signed: 09/15/2016 5:07:37 PM By: Elliot Gurney RN, BSN, Kim RN, BSN Previous Signature: 09/15/2016 4:24:46 PM Version By: Gwenlyn Perking Entered By: Elliot Gurney RN, BSN, Kim on 09/15/2016 16:47:07 [name] ([id number]) -------------------------------------------------------------------------------- Lower Extremity Assessment Details Patient Name: [name]. Date of Service: 09/15/2016 3:30 PM Medical Record Patient Account Number: [id number] Number: Treating RN: [name] 1934-05-10 (83 y.o. Other Clinician: Date of Birth/Sex: Female) Treating ROBSON, MICHAEL Primary Care Frenchie Dangerfield: [name] Ryn Peine/Extender: G Referring Khori Rosevear: Charolotte Capuchin in Treatment: 39 Vascular Assessment Claudication: Claudication Assessment [Right:None] Pulses: Dorsalis Pedis Palpable: [Right:Yes] Posterior Tibial Extremity colors, hair growth, and conditions: Extremity Color: [Right:Hyperpigmented] Hair Growth on Extremity: [Right:No] Temperature of Extremity: [Right:Warm] Capillary Refill: [Right:< 3 seconds] Toe Nail Assessment Left: Right: Thick: Yes Discolored: No Deformed: Yes Improper Length and Hygiene: No Electronic Signature(s) Signed: 09/15/2016 5:07:37 PM By: Elliot Gurney, RN, BSN, Kim RN, BSN Entered By: Elliot Gurney, RN, BSN, Kim on 09/15/2016 15:55:41 [name] ([id number]) -------------------------------------------------------------------------------- Multi Wound Chart Details Patient Name: [name]. Date of Service: 09/15/2016 3:30 PM Medical Record Patient Account Number: [id number] Number: Treating RN: [name] 14-Feb-1934 (83 y.o. Other Clinician: Date of Birth/Sex: Female) Treating ROBSON, MICHAEL Primary Care Satoshi Kalas: [name] Joseluis Alessio/Extender: G Referring Charlotte Fidalgo: Charolotte Capuchin in Treatment: 39 Vital Signs Height(in): 65 Pulse(bpm): 79 Weight(lbs): 160 Blood Pressure 98/46 (mmHg): Body Mass Index(BMI): 27 Temperature(F): 98.0 Respiratory Rate 16 (breaths/min): Photos: [20:No Photos] [9:No Photos] [N/A:N/A] Wound Location: [20:Right Toe Great] [9:Right Achilles] [N/A:N/A] Wounding Event: [20:Gradually Appeared] [9:Gradually Appeared] [N/A:N/A] Primary Etiology: [20:Atypical] [9:Diabetic Wound/Ulcer of N/A the Lower Extremity] Secondary Etiology: [20:N/A] [9:Arterial Insufficiency Ulcer N/A] Date Acquired: [20:09/09/2016] [9:06/02/2015] [N/A:N/A] Weeks of Treatment: [20:0] [9:39] [N/A:N/A] Wound Status: [20:Open] [9:Open] [N/A:N/A] Pending Amputation on No [9:Yes] [N/A:N/A] Presentation: Measurements  L x W x D 1.1x1x0.1 [9:2x0.8x0.1] [N/A:N/A] (cm) Area (cm) : [20:0.864] [9:1.257] [N/A:N/A] Volume (cm) : [20:0.086] [9:0.126] [N/A:N/A] % Reduction in Area: [20:-52.90%] [9:96.10%] [N/A:N/A] % Reduction in Volume: -50.90% [9:98.70%] [N/A:N/A] Classification: [20:Partial Thickness] [9:Grade 2] [N/A:N/A] Debridement: [20:Chemical/enzymatic - Non-Selective] [9:N/A] [N/A:N/A] Procedural Pain: [20:0] [9:N/A] [N/A:N/A] Post Procedural Pain: 0 [9:N/A] [N/A:N/A] Debridement Treatment Procedure was tolerated [9:N/A] [N/A:N/A] Response: [20:well] Post Debridement [20:1.2x0.6x0.1] [9:N/A] [N/A:N/A] Measurements L x W x D (cm) [20:0.057] [9:N/A] [N/A:N/A] Post Debridement Volume: (cm) Periwound Skin Texture: No Abnormalities Noted No Abnormalities Noted N/A Periwound Skin No Abnormalities Noted No Abnormalities Noted N/A Moisture: Periwound Skin Color: No Abnormalities Noted No Abnormalities Noted N/A Tenderness on No No N/A Palpation: Procedures Performed:  Debridement N/A N/A Treatment Notes Wound #9 (Right Achilles) 1. Cleansed with: Clean wound with Normal Saline 2. Anesthetic Topical Lidocaine 4% cream to wound bed prior to debridement 4. Dressing Applied: Hydrafera Blue 5. Secondary Dressing Applied ABD Pad Kerlix/Conform 7. Secured with Tape Notes kerlix, coban; Right great toe-Santyl, gauze and conform Electronic Signature(s) Signed: 09/16/2016 7:57:33 AM By: Baltazar Najjar MD Previous Signature: 09/15/2016 5:07:37 PM Version By: Elliot Gurney RN, BSN, Kim RN, BSN Entered By: Baltazar Najjar on 09/16/2016 07:49:19 [name] ([id number]) -------------------------------------------------------------------------------- Multi-Disciplinary Care Plan Details Patient Name: [name], [name]. Date of Service: 09/15/2016 3:30 PM Medical Record Patient Account Number: [id number] Number: Treating RN: [name] 01-12-1934 (82 y.o. Other Clinician: Date of Birth/Sex: Female) Treating ROBSON, MICHAEL Primary Care Kyung Muto: [name] Clydette Privitera/Extender: G Referring Kailei Cowens: Charolotte Capuchin in Treatment: 18 Active Inactive ` Abuse / Safety / Falls / Self Care Management Nursing Diagnoses: Impaired physical mobility Potential for falls Goals: Patient will remain injury free Date Initiated: 12/17/2015 Target Resolution Date: 08/29/2016 Goal Status: Active Interventions: Assess fall risk on admission and as needed Notes: ` Necrotic Tissue Nursing Diagnoses: Impaired tissue integrity related to necrotic/devitalized tissue Knowledge deficit related to management of necrotic/devitalized tissue Goals: Necrotic/devitalized tissue will be minimized in the wound bed Date Initiated: 02/04/2016 Target Resolution Date: 08/29/2016 Goal Status: Active Interventions: Assess patient pain level pre-, during and post procedure and prior to discharge Provide education on necrotic tissue and debridement process Treatment  Activities: Apply topical anesthetic as ordered : 02/04/2016 [name], [name] ([id number]) Notes: ` Wound/Skin Impairment Nursing Diagnoses: Impaired tissue integrity Goals: Patient/caregiver will verbalize understanding of skin care regimen Date Initiated: 12/17/2015 Target Resolution Date: 08/29/2016 Goal Status: Active Ulcer/skin breakdown will have a volume reduction of 30% by week 4 Date Initiated: 12/17/2015 Target Resolution Date: 08/29/2016 Goal Status: Active Ulcer/skin breakdown will have a volume reduction of 50% by week 8 Date Initiated: 12/17/2015 Target Resolution Date: 08/29/2016 Goal Status: Active Ulcer/skin breakdown will have a volume reduction of 80% by week 12 Date Initiated: 12/17/2015 Target Resolution Date: 08/29/2016 Goal Status: Active Ulcer/skin breakdown will heal within 14 weeks Date Initiated: 12/17/2015 Target Resolution Date: 08/29/2016 Goal Status: Active Interventions: Assess patient/caregiver ability to obtain necessary supplies Assess patient/caregiver ability to perform ulcer/skin care regimen upon admission and as needed Assess ulceration(s) every visit Notes: Electronic Signature(s) Signed: 09/15/2016 5:07:37 PM By: Elliot Gurney, RN, BSN, Kim RN, BSN Entered By: Elliot Gurney, RN, BSN, Kim on 09/15/2016 15:55:50 [name] ([id number]) -------------------------------------------------------------------------------- Pain Assessment Details Patient Name: [name]. Date of Service: 09/15/2016 3:30 PM Medical Record Patient Account Number: [id number] Number: Treating RN: [name] [birth year] (82 y.o. Other Clinician: Date of Birth/Sex: Female) Treating ROBSON, MICHAEL Primary Care Marlowe Lawes: [name] Carmeline Kowal/Extender: G Referring  Mattis Featherly: Charolotte Capuchin in Treatment: 39 Active Problems Location of Pain Severity and Description of Pain Patient Has Paino No Site Locations With Dressing Change: No Pain Management and  Medication Current Pain Management: Electronic Signature(s) Signed: 09/15/2016 5:07:37 PM By: Elliot Gurney, RN, BSN, Kim RN, BSN Entered By: Elliot Gurney, RN, BSN, Kim on 09/15/2016 15:42:43 [name] ([id number]) -------------------------------------------------------------------------------- Patient/Caregiver Education Details Patient Name: [name] Date of Service: 09/15/2016 3:30 PM Medical Record Patient Account Number: [id number] Number: Treating RN: [name] 1934/03/12 (82 y.o. Other Clinician: Date of Birth/Gender: Female) Treating ROBSON, MICHAEL Primary Care Physician: [name] Physician/Extender: G Referring Physician: Charolotte Capuchin in Treatment: 72 Education Assessment Education Provided To: Patient Education Topics Provided Wound/Skin Impairment: Handouts: Caring for Your Ulcer Methods: Demonstration Responses: State content correctly [employer]) Signed: 09/15/2016 5:07:37 PM By: Elliot Gurney, RN, BSN, Kim RN, BSN Entered By: Elliot Gurney, RN, BSN, Kim on 09/15/2016 16:47:21 [name] ([id number]) -------------------------------------------------------------------------------- Wound Assessment Details Patient Name: [name]. Date of Service: 09/15/2016 3:30 PM Medical Record Patient Account Number: [id number] Number: Treating RN: [name] 1934-05-11 (82 y.o. Other Clinician: Date of Birth/Sex: Female) Treating ROBSON, MICHAEL Primary Care Jewell Ryans: [name] Jiyaan Steinhauser/Extender: G Referring Kippy Gohman: Charolotte Capuchin in Treatment: 39 Wound Status Wound Number: 20 Primary Etiology: Atypical Wound Location: Right Toe Great Wound Status: Open Wounding Event: Gradually Appeared Date Acquired: 09/09/2016 Weeks Of Treatment: 0 Clustered Wound: No Photos Photo Uploaded By: Elliot Gurney, RN, BSN, Kim on 09/16/2016 08:15:22 Wound Measurements Length: (cm) 1.1 Width: (cm) 1 Depth: (cm) 0.1 Area: (cm)  0.864 Volume: (cm) 0.086 % Reduction in Area: -52.9% % Reduction in Volume: -50.9% Wound Description Classification: Partial Thickness Periwound Skin Texture Texture Color No Abnormalities Noted: No No Abnormalities Noted: No Moisture No Abnormalities Noted: No Electronic Signature(s) Signed: 09/15/2016 5:07:37 PM By: Elliot Gurney, RN, BSN, Kim RN, BSN Entered By: Elliot Gurney, RN, BSN, Kim on 09/15/2016 15:51:56 [name], [name] ([id number]) [name], [name] ([id number]) -------------------------------------------------------------------------------- Wound Assessment Details Patient Name: [name]. Date of Service: 09/15/2016 3:30 PM Medical Record Patient Account Number: [id number] Number: Treating RN: [name] Mar 26, 1934 (82 y.o. Other Clinician: Date of Birth/Sex: Female) Treating ROBSON, MICHAEL Primary Care Quintel Mccalla: [name] Rowland Ericsson/Extender: G Referring Analis Distler: Charolotte Capuchin in Treatment: 39 Wound Status Wound Number: 9 Primary Etiology: Diabetic Wound/Ulcer of the Lower Extremity Wound Location: Right Achilles Secondary Arterial Insufficiency Ulcer Wounding Event: Gradually Appeared Etiology: Date Acquired: 06/02/2015 Wound Status: Open Weeks Of Treatment: 39 Clustered Wound: No Pending Amputation On Presentation Photos Photo Uploaded By: Elliot Gurney, RN, BSN, Kim on 09/16/2016 08:15:23 Wound Measurements Length: (cm) 2 Width: (cm) 0.8 Depth: (cm) 0.1 Area: (cm) 1.257 Volume: (cm) 0.126 % Reduction in Area: 96.1% % Reduction in Volume: 98.7% Wound Description Classification: Grade 2 Periwound Skin Texture Texture Color No Abnormalities Noted: No No Abnormalities Noted: No Moisture No Abnormalities Noted: No Treatment Notes Wound #9 (Right Achilles) [name], [name]. ([id number]) 1. Cleansed with: Clean wound with Normal Saline 2. Anesthetic Topical Lidocaine 4% cream to wound bed prior to debridement 4. Dressing  Applied: Hydrafera Blue 5. Secondary Dressing Applied ABD Pad Kerlix/Conform 7. Secured with Tape Notes kerlix, coban; Right great toe-Santyl, gauze and conform Electronic Signature(s) Signed: 09/15/2016 5:07:37 PM By: Elliot Gurney, RN, BSN, Kim RN, BSN Entered By: Elliot Gurney, RN, BSN, Kim on 09/15/2016 15:51:56 [name] ([id number]) -------------------------------------------------------------------------------- Vitals Details Patient Name: [name] Date of Service: 09/15/2016 3:30 PM Medical Record Patient Account Number: [id number]  Number: Treating RN: [name] [birth year] (82 y.o. Other Clinician: Date of Birth/Sex: Female) Treating ROBSON, MICHAEL Primary Care Zorawar Strollo: [name] Denton Derks/Extender: G Referring Lexxi Koslow: Charolotte Capuchin in Treatment: 39 Vital Signs Time Taken: 15:43 Temperature (F): 98.0 Height (in): 65 Pulse (bpm): 79 Weight (lbs): 160 Respiratory Rate (breaths/min): 16 Body Mass Index (BMI): 26.6 Blood Pressure (mmHg): 98/46 Reference Range: 80 - 120 mg / dl Electronic Signature(s) Signed: 09/15/2016 5:07:37 PM By: Elliot Gurney, RN, BSN, Kim RN, BSN Entered By: Elliot Gurney, RN, BSN, Kim on 09/15/2016 15:45:24

## 2016-09-23 ENCOUNTER — Encounter: Payer: No Typology Code available for payment source | Admitting: Internal Medicine

## 2016-09-23 DIAGNOSIS — E11621 Type 2 diabetes mellitus with foot ulcer: Secondary | ICD-10-CM | POA: Diagnosis not present

## 2016-09-24 NOTE — Progress Notes (Signed)
DUSTEE, BOTTENFIELD ([id number]) Visit Report for 09/23/2016 Chief Complaint Document Details Patient Name: [name], [name]. Date of Service: 09/23/2016 3:30 PM Medical Record Patient Account Number: [id number] Number: Treating RN: Phillis Haggis 1934-05-13 (82 y.o. Other Clinician: Date of Birth/Sex: Female) Treating Quinterius Gaida Primary Care Provider: [name] Provider/Extender: G Referring Provider: Charolotte Capuchin in Treatment: 40 Information Obtained from: Patient Chief Complaint Ms. Prust presents today for follow-up evaluation of her diabetic foot ulcers and abdominal wound. Electronic Signature(s) Signed: 09/23/2016 4:33:23 PM By: Baltazar Najjar MD Entered By: Baltazar Najjar on 09/23/2016 16:21:13 [name] ([id number]) -------------------------------------------------------------------------------- Debridement Details Patient Name: [name] Date of Service: 09/23/2016 3:30 PM Medical Record Patient Account Number: [id number] Number: Treating RN: Phillis Haggis 07-20-1933 (82 y.o. Other Clinician: Date of Birth/Sex: Female) Treating Alisabeth Selkirk Primary Care Provider: [name] Provider/Extender: G Referring Provider: Charolotte Capuchin in Treatment: 40 Debridement Performed for Wound #20 Right Toe Great Assessment: Performed By: Physician Maxwell Caul, MD Debridement: Debridement Pre-procedure Yes - 15:55 Verification/Time Out Taken: Start Time: 15:56 Pain Control: Lidocaine 4% Topical Solution Level: Skin/Subcutaneous Tissue Total Area Debrided (L x 1.3 (cm) x 0.9 (cm) = 1.17 (cm) W): Tissue and other Viable, Non-Viable, Exudate, Fibrin/Slough, Subcutaneous material debrided: Instrument: Curette Bleeding: Minimum Hemostasis Achieved: Pressure End Time: 16:00 Procedural Pain: 0 Post Procedural Pain: 0 Response to Treatment: Procedure was tolerated well Post Debridement Measurements of  Total Wound Length: (cm) 1.3 Width: (cm) 0.9 Depth: (cm) 0.1 Volume: (cm) 0.092 Character of Wound/Ulcer Post Requires Further Debridement Debridement: Severity of Tissue Post Debridement: Fat layer exposed Post Procedure Diagnosis Same as Pre-procedure Electronic Signature(s) Signed: 09/23/2016 4:33:23 PM By: Baltazar Najjar MD Signed: 09/23/2016 4:45:04 PM By: Adair Laundry ([id number]) Entered By: Baltazar Najjar on 09/23/2016 16:20:54 [name] ([id number]) -------------------------------------------------------------------------------- Debridement Details Patient Name: [name]. Date of Service: 09/23/2016 3:30 PM Medical Record Patient Account Number: [id number] Number: Treating RN: Phillis Haggis 1933-07-22 (82 y.o. Other Clinician: Date of Birth/Sex: Female) Treating Davell Beckstead Primary Care Provider: [name] Provider/Extender: G Referring Provider: Charolotte Capuchin in Treatment: 40 Debridement Performed for Wound #9 Right Achilles Assessment: Performed By: Physician Maxwell Caul, MD Debridement: Debridement Pre-procedure Yes - 15:55 Verification/Time Out Taken: Start Time: 16:00 Pain Control: Lidocaine 4% Topical Solution Level: Skin/Subcutaneous Tissue Total Area Debrided (L x 1 (cm) x 0.9 (cm) = 0.9 (cm) W): Tissue and other Viable, Non-Viable, Exudate, Fibrin/Slough, Subcutaneous material debrided: Instrument: Curette Bleeding: Minimum Hemostasis Achieved: Pressure End Time: 16:02 Procedural Pain: 0 Post Procedural Pain: 0 Response to Treatment: Procedure was tolerated well Post Debridement Measurements of Total Wound Length: (cm) 1 Width: (cm) 0.9 Depth: (cm) 0.1 Volume: (cm) 0.071 Character of Wound/Ulcer Post Requires Further Debridement Debridement: Severity of Tissue Post Debridement: Fat layer exposed Post Procedure Diagnosis Same as Pre-procedure Electronic  Signature(s) Signed: 09/23/2016 4:33:23 PM By: Baltazar Najjar MD Signed: 09/23/2016 4:45:04 PM By: Adair Laundry ([id number]) Entered By: Baltazar Najjar on 09/23/2016 16:21:04 [name] ([id number]) -------------------------------------------------------------------------------- HPI Details Patient Name: [name] Date of Service: 09/23/2016 3:30 PM Medical Record Patient Account Number: [id number] Number: Treating RN: Phillis Haggis 02/23/1934 (82 y.o. Other Clinician: Date of Birth/Sex: Female) Treating Lilyana Lippman Primary Care Provider: [name] Provider/Extender: G Referring Provider: Charolotte Capuchin in Treatment: 40 History of Present Illness Location: right posterior heel, right Achilles, right lower quadrant abdomen, right fourth toe amp site Quality: denies pain  to any wound Severity: not applicable Timing: denies pain HPI Description: 83 year old patient who is known to be diabetic, was referred to [place] by Dr. Gavin Potters for a right heel ulceration which she's had for a while. She was recently in hospital for a pneumonia and at that time and got delirious and was disoriented and sometime during this time developed a stage II ulcer on her right heel. Her past medical history is significant for bilateral pneumonia which was treated with injectable antibiotics and then to oral Levaquin which he has completed. She also has acute on chronic diastolic CHF, acute on chronic respiratory failure, end-stage renal disease on hemodialysis, atrial fibrillation, recent stroke, diabetes mellitus. The patient and her son are poor historians but from what I understand she was admitted to the hospital with an acute vascular compromise of her right lower extremity and Dr. Wyn Quaker has done a surgical procedure and we are trying to obtain these notes. There are also some vascular workup done and we will try and obtain these notes. the injury to  the left lower quadrant of abdomen and the suprapubic area have been there due to a bruise and have been there for several months and no intervention has been done. 10/11/2015 -- on review of the electronics records it was noted that the patient was admitted to the hospital on 09/14/2015 with peripheral vascular disease with claudication, end-stage renal disease, pressure ulcer, chronic atrial fibrillation. She was seen by Dr. Wyn Quaker who did her right lower extremity angiogram , angioplasty of the right anterior tibial artery and thrombolysis with TPA of the right popliteal artery, and thrombectomy. She was seen by Dr. Wyn Quaker during this past week and he was pleased with the progress. He did say that if he took her to the operating room for any procedure he would debride the abdominal wound under anesthesia. She was also seen by Dr. Ether Griffins the podiatrist who thought that she may lose her right fourth toe at some stage may need an amputation of this. 10/21/2015 --patient known to Dr. Wyn Quaker and his last office visit from 10/04/2015 has been reviewed. 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 She also had a persistent ulceration of the right heel and markedly this area and a right third and fourth toe and a small scab on the calf but these are dry and seemed to be improving. Patient also has a left carotid endarterectomy and multiple interventions to a right brachiocephalic AV fistula. After the visit he had recommended noninvasive studies to recheck her revascularization. He was off the impression that she would likely lose the right fourth toe and the third toe was likely to heal. [name] ([id number]) He was concerned about underlying muscle necrosis on her right heel and midfoot. 11/01/2015 -- an echo done in January of this year showed her left ventricular ejection fraction  to be about 50-55%. The patient was seen by the PA and Dr. Driscilla Grammes office and the plan was to take her to the operating room soon to have a debridement under anesthesia for the abdominal wall wound, the Achilles tendon on the right leg and amputation of the right fourth toe. The daughter and the patient do not feel that they would be able to undergo hyperbaric oxygen therapy 5 days a week for 6 weeks. 12/17/15; this is a medically complex woman who I note was recently in this clinic however  I was not involved with her care. She returns today with multiple wounds; a) she has a wound in the mid abdomen that is been there since March of this year. I note that she is been to the overall for debridement recently. The exact etiology of this wound is not really clear b) left lower quadrant abdominal wound had some sanguinous drainage when she came in here. The patient fell in January and thinks this may have been secondary to a hematoma. c): The patient has 3 wounds on her right leg including a small wound on the right mid calf, a large area over the Achilles which currently has a wound VAC for the last 6 weeks, also a smaller wound on the distal part of the right heel. As far as I understand most of these wounds are currently been dressed with's calcium alginate. According to her daughter the Achilles wound under the wound VAC is doing well d) the patient is had an amputation of her left fifth toe in January and the right fourth toe 6 weeks ago secondary to diabetic PAD e) the patient has chronic renal failure on dialysis for the last 2 years secondary to type 2 diabetes on insulin. The daughter's knowledge there is been no biopsy of the abdominal wounds given their current appearance and lack of undefined etiology at have to wonder about calciphylaxis. 12/18/15:Addendum; I have reviewed [facility] I can see no relevant x-rays of the right heel. I note her arteriogram and revascularization of her  right lower extremity in April 2017. She had debridement of both abdominal wounds and the right heel and Achilles wound on 11/07/15 at which time she had a right fourth toe ray amputation. The abdominal wounds were debridement again on 6/29. I do not see any relevant pathology of these abdominal wounds 12/24/15; culture I did of the drainage from the midline abdominal wound last week showed both Proteus and ampicillin sensitive enterococcus. I've given her a course of Augmentin adjusted on dialysis days. She has no specific complaints today. Been using Santyl to the abdominal wounds in the right leg wound and the wound VAC on the right Achilles which was initially prescribed by Dr. dew 12/31/15; I have done two punch biopsies of the large midline abdominal. 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 May have been a trauma component of the one on the left lower quadrant however the midline wound had no such history. She has a large area on the right Achilles heel with a wound VAC prescribed by Dr. dew. A small wound on the right anterior leg.Marland Kitchen UNFORTUNATELY she has 2 new wounds today. One on the left heel which is probably a pressure area. As well her previous amputation site of her right fourth toe has dehisced and now has a small wound with significant depth at the amputation site. 01/14/2016 -- she returns after 2 weeks and had had a punch biopsy of abdominal wound done the last visit -- had a biopsy of her midline abdominal wound done and the Pathology diagnosis is that of ulceration, necrosis and inflammation and negative for dysplasia and malignancy. 01/21/16. 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 In the meantime she has new wounds of the left heel the right fourth toe amputation site is opened up. The back is stopped to the right heel area. 01/28/16; the abdominal wounds continued to improve. The extensive wound on her right  Achilles also looks stable except for  the lower aspect of the wound where there is a large liquefied area that probes right down to her calcaneus. This cultured Proteus last week I have her on Augmentin and doxycycline 1. I think this is TIFFAY, PINETTE ([id number]) going to need a course of IV antibiotics and I will call dialysis. X-ray I did last week was negative, I think she is going to need an MRI 02/04/16; right heel MRI as before Saturday. Receiving I believe IV Rocephin at dialysis 02/11/16; as it turns out the patient could not have a MRI as she has a bladder stimulator in place even though it is not currently in use since the beginning of this year. Although she has an allergy to IV contrast she apparently has done well with premedication so we will have to go for a CT scan with contrast. In the meantime she has had a fall now has a large skin tear on her left upper arm. She went to the ER and they suggested Tegaderm over topical antibiotics 03/03/16 currently patient returns after having been hospitalized for 2 weeks and subsequently transferred to [facility] nursing facility. She actually seems to be doing excellent compared to even when we last saw her according to our nursing staff. Both patient and her daughter are extremely encouraged at how well she is presenting at this point in time. Overall the biggest issue is still the right Achilles area which is being managed at this point in time by Dr. Wyn Quaker. Patient is currently utilizing a wound VAC to that region. 03/17/16; patient is at [facility] nursing home still. Using Aquacel Ag to the wounds on her bilateral feet and still Prisma to the small open area on her abdomen. 03/31/16 at this point in time patient has been tolerating the dressing changes currently. She fortunately has no worsening of her symptoms although she tells me that the nurse who is caring for her at [facility] nursing facility decided that nothing was needed in regard to  the lower abdominal wound from a dressing standpoint at this time. she is really not having significant discomfort or pain at this point she continues to have some tunneling in the proximal Achilles wound region. 04/07/16 patient continues to do well on evaluation today. Even the Achilles wound which has been the most tender is not giving her as much trouble. Unfortunately the PolyMem dressings that we utilize last week really did not seem to benefit her in particular. Obviously we will discontinue that at this point in time today. 04-15-16:Ms. Gutridge is accompanied today by her daughter. She is still residing in an SNF undergoing dialysis, continues to receive antibiotics during dialysis as prescribed by Dr. Sampson Goon of infectious disease. She has a follow-up appointment with Dr. Sampson Goon on 04-17-16 to discuss the continuation of these antibiotics. Ms. Pesch denies any pain to any of the 3 remaining wounds she does admit to changing to [employer] surgical shoes for safety while ambulating with physical therapy. She denies any falls since her last appointment here although she and her daughter do admit to increased tremors of unclear etiology since her last appointment. She has tolerated the dressing changes that were prescribed last week. 04-22-16 Ms. Kelson presents today accompanied by her daughter for evaluation of her diabetic foot ulcers. She is still residing in an SNF, she continues dialysis 3 times weekly. She'll follow up with Dr. Sampson Goon last Friday, and at that appointment IV antibiotics were discontinued. According to Ms. Menzel and her daughter if there is  any regression of her wounds Dr. Sampson Goon will consider re-starting the antibiotics. Ms. Selle daughter states that since the discontinuation of antibiotic therapy her "twitches" have resolved. 04/29/16; I have not seen this patient in quite some time however she is completed triple antibiotic therapy given at dialysis  for osteomyelitis as prescribed by infectious disease. She is still being followed by Dr. dew of vascular surgery. We are using Hydrofera Blue to the surface of these wounds. She currently has 2 open areas a substantial area over her Achilles area although this is a lot better than the last time I saw this. She also has a small wound superiorly from this wound which has a superior probing depth of 2 cm. 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 My biopsy did not prove this nevertheless I think this is the correct clinical diagnosis. We are now following her for an area on the right Achilles part of her heel. This is not any different from last week. DESARE, DUDDY ([id number]) She also has a small tunneling area just above this. And unfortunately this week there is been a reopening of an area where her right fourth toe was previously amputated. She is completed antibiotics 05/13/16; she has a new reopening on the mid abdominal wound in the same site is previously. This is a small open area. Apligraf #1 today to the areas on the right heel o2 still an open area in the fourth toe amputation site 05/27/16; the areas on her abdomen are completely closed over. Small open area from last time is closed. Apligraf #2 today to the areas on the right heel. The 4th toe amputation site is also heel 06/10/16; we did not have an Apligraf to put on today. This appeared tunneling wound on the right posterior calf appears to be closed. The more substantial area on her Achilles itself is improved. 06/24/16; I reapplied her third Apligraf today although we have not had one debridement last time. The original tunneling wound is not as closed as last time. The more substantial area on her  Achilles itself is improved with advancing epithelialization 07/08/16 Apligraf #4 today. The original long area over her Achilles as a healthy-looking area at the superior aspect and a small divot inferiorly. Both of the wound surface is look healthy 07/22/16; patient arrived today with a 2 original wounds on the Achilles part of her ankle too small for another application of Apligraf. This is on the right Achilles area. The small divot inferiorly looks as though it may have skin over most of this and I can't really see an open area. She did have an erythematous area over the medial malleolus with some drainage this looks like a rapid injury not cellulitis 07/29/16; erythematous area over the medial malleolus seems a lot better this week. This was a wrap injury. The areas on the right Achilles is still a small divot. The area superiorly down somewhat in size. 08/05/16; condition is generally not feeling well. No major changed either wound. Using Prisma 08/19/16; patient was last here she was admitted to hospital last week. Apparently she was complaining mostly of abdominal pain however a CT scan of the abdomen and pelvis revealed changes in the L2-L3 interspace suspicious for discitis and osteomyelitis as well as changes in the adjacent  paraspinal and psoas muscles. She was seen by infectious disease Dr. Sampson Goon. Her urine culture grew group B strep. An aspirate of the fluid at L2-L3 I reviewed today. Her AFB Gram stain was negative, fungal culture is still in place. C + S is negative so far as of today. She is on vancomycin and ceftazidine dosed at dialysis for 6 weeks. She is not complaining of fever or back pain currently. We have been using [employer] and to her wounds 08/26/16; patient is still receiving IV antibiotics at dialysis. She does not feel well complaining of a lot of pain in the right hip radiating down her leg. She rubs the lateral aspect of the right hip over the  greater trochanter. Her wound measurements were a lot of different this week although I don't think they were accurately recorded last week the wound on the right Achilles area looks about the same. Superficial without any depth. 09/09/16; patient is still receiving IV antibiotics at dialysis chide believe are vancomycin and ceftazidine. Since she was last here she is developed a new wound on the tip of her right great toe. The right Achilles wound had significant surface slough/necrotic material on arrival today Also of note the patient seems to be developing widespread myoclonic jerking which is gotten a lot worse over the last week to 2 although our intake nurse and the patient's daughter both state that she had had this at certain times in the past I had not specifically noticed this. Apparently at one point this was due to phosphorus abnormalities which were corrected at dialysis. I have reviewed her medication list I don't see any usual offenders except she is on Paxil Neurontin and Ultram although she takes the Ultram sparingly maybe once a week 09/15/16; still on vancomycin and ceftazidine as I understand things at dialysis. The area that was on the tip of her right great toe identified last week looks like an ischemic wound. She is known diabetic PAD and has had previous revascularization in 2017 by Dr. Wyn Quaker. It is possible she may need to see him again. I will probably attempt debridement of this next week we are using topical Santyl. In the meantime her heel wound has closed superiorly she is only left with a small wound at the inferior part of the Achilles area which was her deeper part of this wound at one point however even this area looks better 09/23/16; the patient has been to see Dr. Sampson Goon and I gather because of the myoclonic jerking he is CASSARA, NIDA ([id number]) changed one of her antibiotics probably the ceftazidine. Concerning today is the tip of her right great  toe which seems to be an expanding necrotic area. She has known severe PAD and this looks like an ischemic wound to me. She has previously been revascularized by Dr.Dew in nearly part of 2017. We continue to make reasonable progression with the heel wound using Hydrofera Blue. We are using Prisma to the great toe on the right. Electronic Signature(s) Signed: 09/23/2016 4:33:23 PM By: Baltazar Najjar MD Entered By: Baltazar Najjar on 09/23/2016 16:22:41 [name] ([id number]) -------------------------------------------------------------------------------- Physical Exam Details Patient Name: ROSAMUND, NYLAND. Date of Service: 09/23/2016 3:30 PM Medical Record Patient Account Number: [id number] Number: Treating RN: Phillis Haggis [birth year] (82 y.o. Other Clinician: Date of Birth/Sex: Female) Treating Demetria Iwai Primary Care Provider: [name] Provider/Extender: G Referring Provider: Charolotte Capuchin in Treatment: 40 Constitutional Sitting or standing Blood Pressure is within target range  for patient.. Pulse regular and within target range for patient.Marland Kitchen Respirations regular, non-labored and within target range.. Temperature is normal and within the target range for the patient.. Patient's appearance is neat and clean. Appears in no acute distress. Well nourished and well developed.. Cardiovascular Pedal pulses absent bilaterally.. Notes Wound exam; only a small open area remains of the substantial wound on the right Achilles area. This is superficial without any depth and no evidence of infection. We are using Hydrofera Blue to this area using a #3 curet necrotic surface removed, underlying granulation looks quite satisfactory oMy concern is the tip of her right great toe. I think this looks ischemic. Using a #3 curet I debrided this of the necrotic surface not a lot of bleeding. I'm going to refer her back to Dr. Wyn Quaker fpr his evaluation Electronic  Signature(s) Signed: 09/23/2016 4:33:23 PM By: Baltazar Najjar MD Entered By: Baltazar Najjar on 09/23/2016 16:24:57 [name] ([id number]) -------------------------------------------------------------------------------- Physician Orders Details Patient Name: [name] Date of Service: 09/23/2016 3:30 PM Medical Record Patient Account Number: [id number] Number: Treating RN: Phillis Haggis 1934-02-08 (82 y.o. Other Clinician: Date of Birth/Sex: Female) Treating Livier Hendel Primary Care Provider: [name] Provider/Extender: G Referring Provider: Charolotte Capuchin in Treatment: 23 Verbal / Phone Orders: Yes Clinician: Ashok Cordia, Debi Read Back and Verified: Yes Diagnosis Coding Wound Cleansing Wound #20 Right Toe Great o Clean wound with Normal Saline. o Cleanse wound with mild soap and water o May shower with protection. Wound #9 Right Achilles o Clean wound with Normal Saline. o Cleanse wound with mild soap and water o May shower with protection. Anesthetic Wound #20 Right Toe Great o Topical Lidocaine 4% cream applied to wound bed prior to debridement - all wounds for clinic use only Wound #9 Right Achilles o Topical Lidocaine 4% cream applied to wound bed prior to debridement - all wounds for clinic use only Primary Wound Dressing Wound #20 Right Toe Great o Santyl Ointment Wound #9 Right Achilles o Hydrafera Blue Secondary Dressing Wound #20 Right Toe Great o Dry Gauze o Kerlix and Coban Wound #9 Right Achilles o ABD pad o Conform/Kerlix JUSTYN, BOYSON ([id number]) Dressing Change Frequency Wound #20 Right Toe Great o Change dressing every other day. Wound #9 Right Achilles o Change dressing every other day. Follow-up Appointments Wound #20 Right Toe Great o Return Appointment in 1 week. Wound #9 Right Achilles o Return Appointment in 1 week. Edema Control Wound #9 Right  Achilles o Kerlix and Coban - Right Lower Extremity - do not wrap too tight and not the whole leg o Elevate legs to the level of the heart and pump ankles as often as possible Off-Loading Wound #20 Right Toe Great o Other: - Float heels Wound #9 Right Achilles o Other: - Float heels Electronic Signature(s) Signed: 09/23/2016 4:33:23 PM By: Baltazar Najjar MD Signed: 09/23/2016 4:45:04 PM By: Alejandro Mulling Entered By: Alejandro Mulling on 09/23/2016 16:02:47 [name] ([id number]) -------------------------------------------------------------------------------- Problem List Details Patient Name: [name]. Date of Service: 09/23/2016 3:30 PM Medical Record Patient Account Number: [id number] Number: Treating RN: Phillis Haggis 10-Dec-1933 (82 y.o. Other Clinician: Date of Birth/Sex: Female) Treating Evander Macaraeg Primary Care Provider: [name] Provider/Extender: G Referring Provider: Charolotte Capuchin in Treatment: 78 Active Problems ICD-10 Encounter Code Description Active Date Diagnosis E11.621 Type 2 diabetes mellitus with foot ulcer 12/17/2015 Yes L97.512 Non-pressure chronic ulcer of other part of right foot with 03/31/2016 Yes fat layer exposed  E11.51 Type 2 diabetes mellitus with diabetic peripheral 12/17/2015 Yes angiopathy without gangrene Inactive Problems ICD-10 Code Description Active Date Inactive Date M86.671 Other chronic osteomyelitis, right ankle and foot 04/15/2016 04/15/2016 S31.104A Unspecified open wound of abdominal wall, left lower 12/17/2015 12/17/2015 quadrant without penetration into peritoneal cavity, initial encounter S31.103D Unspecified open wound of abdominal wall, right lower 04/15/2016 04/15/2016 quadrant without penetration into peritoneal cavity, subsequent encounter Resolved Problems Electronic Signature(s) LANELL, CARPENTER ([id number]) Signed: 09/23/2016 4:33:23 PM By: Baltazar Najjar MD Entered  By: Baltazar Najjar on 09/23/2016 16:20:39 [name] ([id number]) -------------------------------------------------------------------------------- Progress Note Details Patient Name: [name] Date of Service: 09/23/2016 3:30 PM Medical Record Patient Account Number: [id number] Number: Treating RN: Phillis Haggis [birth year] (82 y.o. Other Clinician: Date of Birth/Sex: Female) Treating Zarya Lasseigne Primary Care Provider: [name] Provider/Extender: G Referring Provider: Charolotte Capuchin in Treatment: 40 Subjective Chief Complaint Information obtained from Patient Ms. Beckstead presents today for follow-up evaluation of her diabetic foot ulcers and abdominal wound. History of Present Illness (HPI) The following HPI elements were documented for the patient's wound: Location: right posterior heel, right Achilles, right lower quadrant abdomen, right fourth toe amp site Quality: denies pain to any wound Severity: not applicable Timing: denies pain 83 year old patient who is known to be diabetic, was referred to [place] by Dr. Gavin Potters for a right heel ulceration which she's had for a while. She was recently in hospital for a pneumonia and at that time and got delirious and was disoriented and sometime during this time developed a stage II ulcer on her right heel. Her past medical history is significant for bilateral pneumonia which was treated with injectable antibiotics and then to oral Levaquin which he has completed. She also has acute on chronic diastolic CHF, acute on chronic respiratory failure, end-stage renal disease on hemodialysis, atrial fibrillation, recent stroke, diabetes mellitus. The patient and her son are poor historians but from what I understand she was admitted to the hospital with an acute vascular compromise of her right lower extremity and Dr. Wyn Quaker has done a surgical procedure and we are trying to obtain these notes. There are also  some vascular workup done and we will try and obtain these notes. the injury to the left lower quadrant of abdomen and the suprapubic area have been there due to a bruise and have been there for several months and no intervention has been done. 10/11/2015 -- on review of the electronics records it was noted that the patient was admitted to the hospital on 09/14/2015 with peripheral vascular disease with claudication, end-stage renal disease, pressure ulcer, chronic atrial fibrillation. She was seen by Dr. Wyn Quaker who did her right lower extremity angiogram , angioplasty of the right anterior tibial artery and thrombolysis with TPA of the right popliteal artery, and thrombectomy. She was seen by Dr. Wyn Quaker during this past week and he was pleased with the progress. He did say that if he took her to the operating room for any procedure he would debride the abdominal wound under anesthesia. She was also seen by Dr. Ether Griffins the podiatrist who thought that she may lose her right fourth toe at some stage may need an amputation of this. KATTIA, SELLEY ([id number]) 10/21/2015 --patient known to Dr. Wyn Quaker and his last office visit from 10/04/2015 has been reviewed. 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 She also had a persistent  ulceration of the right heel and markedly this area and a right third and fourth toe and a small scab on the calf but these are dry and seemed to be improving. Patient also has a left carotid endarterectomy and multiple interventions to a right brachiocephalic AV fistula. After the visit he had recommended noninvasive studies to recheck her revascularization. He was off the impression that she would likely lose the right fourth toe and the third toe was likely to heal. He was concerned about underlying muscle necrosis on her right heel and midfoot. 11/01/2015 -- an  echo done in January of this year showed her left ventricular ejection fraction to be about 50-55%. The patient was seen by the PA and Dr. Driscilla Grammes office and the plan was to take her to the operating room soon to have a debridement under anesthesia for the abdominal wall wound, the Achilles tendon on the right leg and amputation of the right fourth toe. The daughter and the patient do not feel that they would be able to undergo hyperbaric oxygen therapy 5 days a week for 6 weeks. 12/17/15; this is a medically complex woman who I note was recently in this clinic however I was not involved with her care. She returns today with multiple wounds; a) she has a wound in the mid abdomen that is been there since March of this year. I note that she is been to the overall for debridement recently. The exact etiology of this wound is not really clear b) left lower quadrant abdominal wound had some sanguinous drainage when she came in here. The patient fell in January and thinks this may have been secondary to a hematoma. c): The patient has 3 wounds on her right leg including a small wound on the right mid calf, a large area over the Achilles which currently has a wound VAC for the last 6 weeks, also a smaller wound on the distal part of the right heel. As far as I understand most of these wounds are currently been dressed with's calcium alginate. According to her daughter the Achilles wound under the wound VAC is doing well d) the patient is had an amputation of her left fifth toe in January and the right fourth toe 6 weeks ago secondary to diabetic PAD e) the patient has chronic renal failure on dialysis for the last 2 years secondary to type 2 diabetes on insulin. The daughter's knowledge there is been no biopsy of the abdominal wounds given their current appearance and lack of undefined etiology at have to wonder about calciphylaxis. 12/18/15:Addendum; I have reviewed [facility] I can see no relevant  x-rays of the right heel. I note her arteriogram and revascularization of her right lower extremity in April 2017. She had debridement of both abdominal wounds and the right heel and Achilles wound on 11/07/15 at which time she had a right fourth toe ray amputation. The abdominal wounds were debridement again on 6/29. I do not see any relevant pathology of these abdominal wounds 12/24/15; culture I did of the drainage from the midline abdominal wound last week showed both Proteus and ampicillin sensitive enterococcus. I've given her a course of Augmentin adjusted on dialysis days. She has no specific complaints today. Been using Santyl to the abdominal wounds in the right leg wound and the wound VAC on the right Achilles which was initially prescribed by Dr. dew 12/31/15; I have done two punch biopsies of the large midline abdominal. 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 May have  been a trauma component of the one on the left lower quadrant however the midline wound had no such history. She has a large area on the right Achilles heel with a wound VAC prescribed by Dr. dew. A small wound on the right anterior leg.Marland Kitchen UNFORTUNATELY she has 2 new wounds today. One on the left heel which is probably a pressure area. As well her previous amputation site of her right fourth toe has dehisced and now has a small wound with significant depth at the amputation site. 01/14/2016 -- she returns after 2 weeks and had had a punch biopsy of abdominal wound done the last visit GRACIOUS, RENKEN ([id number]) -- had a biopsy of her midline abdominal wound done and the Pathology diagnosis is that of ulceration, necrosis and inflammation and negative for dysplasia and malignancy. 01/21/16. 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 In the meantime she has new wounds of the left heel the right fourth toe amputation site is opened up. The back is stopped to  the right heel area. 01/28/16; the abdominal wounds continued to improve. The extensive wound on her right Achilles also looks stable except for the lower aspect of the wound where there is a large liquefied area that probes right down to her calcaneus. This cultured Proteus last week I have her on Augmentin and doxycycline 1. I think this is going to need a course of IV antibiotics and I will call dialysis. X-ray I did last week was negative, I think she is going to need an MRI 02/04/16; right heel MRI as before Saturday. Receiving I believe IV Rocephin at dialysis 02/11/16; as it turns out the patient could not have a MRI as she has a bladder stimulator in place even though it is not currently in use since the beginning of this year. Although she has an allergy to IV contrast she apparently has done well with premedication so we will have to go for a CT scan with contrast. In the meantime she has had a fall now has a large skin tear on her left upper arm. She went to the ER and they suggested Tegaderm over topical antibiotics 03/03/16 currently patient returns after having been hospitalized for 2 weeks and subsequently transferred to [facility] nursing facility. She actually seems to be doing excellent compared to even when we last saw her according to our nursing staff. Both patient and her daughter are extremely encouraged at how well she is presenting at this point in time. Overall the biggest issue is still the right Achilles area which is being managed at this point in time by Dr. Wyn Quaker. Patient is currently utilizing a wound VAC to that region. 03/17/16; patient is at [facility] nursing home still. Using Aquacel Ag to the wounds on her bilateral feet and still Prisma to the small open area on her abdomen. 03/31/16 at this point in time patient has been tolerating the dressing changes currently. She fortunately has no worsening of her symptoms although she tells me that the nurse who is caring for her  at [facility] nursing facility decided that nothing was needed in regard to the lower abdominal wound from a dressing standpoint at this time. she is really not having significant discomfort or pain at this point she continues to have some tunneling in the proximal Achilles wound region. 04/07/16 patient continues to do well on evaluation today. Even the Achilles wound which has been the most  tender is not giving her as much trouble. Unfortunately the PolyMem dressings that we utilize last week really did not seem to benefit her in particular. Obviously we will discontinue that at this point in time today. 04-15-16:Ms. Sheller is accompanied today by her daughter. She is still residing in an SNF undergoing dialysis, continues to receive antibiotics during dialysis as prescribed by Dr. Sampson Goon of infectious disease. She has a follow-up appointment with Dr. Sampson Goon on 04-17-16 to discuss the continuation of these antibiotics. Ms. Bonifas denies any pain to any of the 3 remaining wounds she does admit to changing to [employer] surgical shoes for safety while ambulating with physical therapy. She denies any falls since her last appointment here although she and her daughter do admit to increased tremors of unclear etiology since her last appointment. She has tolerated the dressing changes that were prescribed last week. 04-22-16 Ms. Kilbride presents today accompanied by her daughter for evaluation of her diabetic foot ulcers. She is still residing in an SNF, she continues dialysis 3 times weekly. She'll follow up with Dr. Sampson Goon last Friday, and at that appointment IV antibiotics were discontinued. According to Ms. Mckenzie and her daughter if there is any regression of her wounds Dr. Sampson Goon will consider re-starting the antibiotics. Ms. Seufert daughter states that since the discontinuation of antibiotic therapy her "twitches" have resolved. 04/29/16; I have not seen this patient in quite some  time however she is completed triple antibiotic therapy given at dialysis for osteomyelitis as prescribed by infectious disease. She is still being followed by Dr. Wyn Quaker [name] ([id number]) of vascular surgery. We are using Hydrofera Blue to the surface of these wounds. She currently has 2 open areas a substantial area over her Achilles area although this is a lot better than the last time I saw this. She also has a small wound superiorly from this wound which has a superior probing depth of 2 cm. 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 My biopsy did not prove this nevertheless I think this is the correct clinical diagnosis. We are now following her for an area on the right Achilles part of her heel. This is not any different from last week. She also has a small tunneling area just above this. And unfortunately this week there is been a reopening of an area where her right fourth toe was previously amputated. She is completed antibiotics 05/13/16; she has a new reopening on the mid abdominal wound in the same site is previously. This is a small open area. Apligraf #1 today to the areas on the right heel o2 still an open area in the fourth toe amputation site 05/27/16; the areas on her abdomen are completely closed over. Small open area from last time is closed. Apligraf #2 today to the areas on the right heel. The 4th toe amputation site is also heel 06/10/16; we did not have an Apligraf to put on today. This appeared tunneling wound on the right posterior calf appears to be closed. The more substantial area on her Achilles itself is improved. 06/24/16; I reapplied her third Apligraf today although we have not had one debridement last time. The original tunneling  wound is not as closed as last time. The more substantial area on her Achilles itself is improved with  advancing epithelialization 07/08/16 Apligraf #4 today. The original long area over her Achilles as a healthy-looking area at the superior aspect and a small divot inferiorly. Both of the wound surface is look healthy 07/22/16; patient arrived today with a 2 original wounds on the Achilles part of her ankle too small for another application of Apligraf. This is on the right Achilles area. The small divot inferiorly looks as though it may have skin over most of this and I can't really see an open area. She did have an erythematous area over the medial malleolus with some drainage this looks like a rapid injury not cellulitis 07/29/16; erythematous area over the medial malleolus seems a lot better this week. This was a wrap injury. The areas on the right Achilles is still a small divot. The area superiorly down somewhat in size. 08/05/16; condition is generally not feeling well. No major changed either wound. Using Prisma 08/19/16; patient was last here she was admitted to hospital last week. Apparently she was complaining mostly of abdominal pain however a CT scan of the abdomen and pelvis revealed changes in the L2-L3 interspace suspicious for discitis and osteomyelitis as well as changes in the adjacent paraspinal and psoas muscles. She was seen by infectious disease Dr. Sampson Goon. Her urine culture grew group B strep. An aspirate of the fluid at L2-L3 I reviewed today. Her AFB Gram stain was negative, fungal culture is still in place. C + S is negative so far as of today. She is on vancomycin and ceftazidine dosed at dialysis for 6 weeks. She is not complaining of fever or back pain currently. We have been using [employer] and to her wounds 08/26/16; patient is still receiving IV antibiotics at dialysis. She does not feel well complaining of a lot of pain in the right hip radiating down her leg.  She rubs the lateral aspect of the right hip over the greater trochanter. Her wound measurements were a lot of different this week although I don't think they were accurately recorded last week the wound on the right Achilles area looks about the same. Superficial without any depth. 09/09/16; patient is still receiving IV antibiotics at dialysis chide believe are vancomycin and ceftazidine. Since she was last here she is developed a new wound on the tip of her right great toe. The right Achilles wound had significant surface slough/necrotic material on arrival today Also of note the patient seems to be developing widespread myoclonic jerking which is gotten a lot worse over the last week to 2 although our intake nurse and the patient's daughter both state that she had had this at certain times in the past I had not specifically noticed this. Apparently at one point this was due to phosphorus abnormalities which were corrected at dialysis. I have reviewed her medication list I don't see any usual offenders except she is on Paxil Neurontin and Ultram although she takes the Ultram sparingly TREASA, BRADSHAW ([id number]) maybe once a week 09/15/16; still on vancomycin and ceftazidine as I understand things at dialysis. The area that was on the tip of her right great toe identified last week looks like an ischemic wound. She is known diabetic PAD and has had previous revascularization in 2017 by Dr. Wyn Quaker. It is possible she may need to see him again. I will probably attempt debridement of this next week we are using topical Santyl. In the meantime her heel wound has closed superiorly she is only left with a small wound at  the inferior part of the Achilles area which was her deeper part of this wound at one point however even this area looks better 09/23/16; the patient has been to see Dr. Sampson Goon and I gather because of the myoclonic jerking he is changed one of her antibiotics probably the  ceftazidine. Concerning today is the tip of her right great toe which seems to be an expanding necrotic area. She has known severe PAD and this looks like an ischemic wound to me. She has previously been revascularized by Dr.Dew in nearly part of 2017. We continue to make reasonable progression with the heel wound using Hydrofera Blue. We are using Prisma to the great toe on the right. Objective Constitutional Sitting or standing Blood Pressure is within target range for patient.. Pulse regular and within target range for patient.Marland Kitchen Respirations regular, non-labored and within target range.. Temperature is normal and within the target range for the patient.. Patient's appearance is neat and clean. Appears in no acute distress. Well nourished and well developed.. Vitals Time Taken: 3:36 PM, Height: 65 in, Weight: 160 lbs, BMI: 26.6, Pulse: 80 bpm, Respiratory Rate: 16 breaths/min, Blood Pressure: 112/43 mmHg. General Notes: Pt states that she just got done with dialysis so that is why her BP is low and that is normal for her. I will let MD know. Cardiovascular Pedal pulses absent bilaterally.. General Notes: Wound exam; only a small open area remains of the substantial wound on the right Achilles area. This is superficial without any depth and no evidence of infection. We are using Hydrofera Blue to this area using a #3 curet necrotic surface removed, underlying granulation looks quite satisfactory My concern is the tip of her right great toe. I think this looks ischemic. Using a #3 curet I debrided this of the necrotic surface not a lot of bleeding. I'm going to refer her back to Dr. Wyn Quaker fpr his evaluation Integumentary (Hair, Skin) Wound #20 status is Open. Original cause of wound was Gradually Appeared. The wound is located on the Right Toe Great. The wound measures 1.3cm length x 0.9cm width x 0.1cm depth; 0.919cm^2 area and 0.092cm^3 volume. There is no tunneling or undermining noted.  There is a large amount of serous drainage noted. The wound margin is distinct with the outline attached to the wound base. There is no granulation within the wound bed. There is a large (67-100%) amount of necrotic tissue within the wound bed including Eschar. Periwound temperature was noted as No Abnormality. The periwound has tenderness on palpation. ELENER, CUSTODIO ([id number]) Wound #9 status is Open. Original cause of wound was Gradually Appeared. The wound is located on the Right Achilles. The wound measures 1cm length x 0.9cm width x 0.1cm depth; 0.707cm^2 area and 0.071cm^3 volume. There is no tunneling or undermining noted. There is a large amount of serosanguineous drainage noted. The wound margin is distinct with the outline attached to the wound base. There is small (1-33%) red granulation within the wound bed. There is a large (67-100%) amount of necrotic tissue within the wound bed including Eschar and Adherent Slough. Periwound temperature was noted as No Abnormality. The periwound has tenderness on palpation. Assessment Active Problems ICD-10 E11.621 - Type 2 diabetes mellitus with foot ulcer L97.512 - Non-pressure chronic ulcer of other part of right foot with fat layer exposed E11.51 - Type 2 diabetes mellitus with diabetic peripheral angiopathy without gangrene Procedures Wound #20 Wound #20 is an Atypical located on the Right Toe Great . There was  a Skin/Subcutaneous Tissue Debridement ([id number]) debridement with total area of 1.17 sq cm performed by Maxwell Caul, MD. with the following instrument(s): Curette to remove Viable and Non-Viable tissue/material including Exudate, Fibrin/Slough, and Subcutaneous after achieving pain control using Lidocaine 4% Topical Solution. A time out was conducted at 15:55, prior to the start of the procedure. A Minimum amount of bleeding was controlled with Pressure. The procedure was tolerated well with a pain level of 0  throughout and a pain level of 0 following the procedure. Post Debridement Measurements: 1.3cm length x 0.9cm width x 0.1cm depth; 0.092cm^3 volume. Character of Wound/Ulcer Post Debridement requires further debridement. Severity of Tissue Post Debridement is: Fat layer exposed. Post procedure Diagnosis Wound #20: Same as Pre-Procedure Wound #9 Wound #9 is a Diabetic Wound/Ulcer of the Lower Extremity located on the Right Achilles . There was a Skin/Subcutaneous Tissue Debridement ([id number]) debridement with total area of 0.9 sq cm performed by Maxwell Caul, MD. with the following instrument(s): Curette to remove Viable and Non-Viable tissue/material including Exudate, Fibrin/Slough, and Subcutaneous after achieving pain control using Lidocaine 4% Topical Solution. A time out was conducted at 15:55, prior to the start of the procedure. A Minimum amount of bleeding was controlled with Pressure. The procedure was tolerated well with a pain level of 0 throughout and a pain level of 0 following the procedure. Post Debridement Measurements: 1cm ALSIE, YOUNES. ([id number]) length x 0.9cm width x 0.1cm depth; 0.071cm^3 volume. Character of Wound/Ulcer Post Debridement requires further debridement. Severity of Tissue Post Debridement is: Fat layer exposed. Post procedure Diagnosis Wound #9: Same as Pre-Procedure Plan Wound Cleansing: Wound #20 Right Toe Great: Clean wound with Normal Saline. Cleanse wound with mild soap and water May shower with protection. Wound #9 Right Achilles: Clean wound with Normal Saline. Cleanse wound with mild soap and water May shower with protection. Anesthetic: Wound #20 Right Toe Great: Topical Lidocaine 4% cream applied to wound bed prior to debridement - all wounds for clinic use only Wound #9 Right Achilles: Topical Lidocaine 4% cream applied to wound bed prior to debridement - all wounds for clinic use only Primary Wound Dressing: Wound #20  Right Toe Great: Santyl Ointment Wound #9 Right Achilles: Hydrafera Blue Secondary Dressing: Wound #20 Right Toe Great: Dry Gauze Kerlix and Coban Wound #9 Right Achilles: ABD pad Conform/Kerlix Dressing Change Frequency: Wound #20 Right Toe Great: Change dressing every other day. Wound #9 Right Achilles: Change dressing every other day. Follow-up Appointments: Wound #20 Right Toe Great: Return Appointment in 1 week. Wound #9 Right Achilles: Return Appointment in 1 week. Edema Control: Wound #9 Right Achilles: IRELYNN, SCHERMERHORN ([id number]) Kerlix and Coban - Right Lower Extremity - do not wrap too tight and not the whole leg Elevate legs to the level of the heart and pump ankles as often as possible Off-Loading: Wound #20 Right Toe Great: Other: - Float heels Wound #9 Right Achilles: Other: - Float heels #1 I continue with Santyl to the tip of her right great toe #2 continue with Hydrofera blue to the Achilles area which seems to be closing down #3 consult to Dr. Wyn Quaker to review the vascular status in the right foot #4 Dr. Sampson Goon is changed to Rocephin I believe wondering if this has something to do with the myoclonic jerking I previously thought of this as beta-lactam antibiotics can have central side effects at high doses. I'll be interested to see if the myoclonic jerking abates Electronic Signature(s) Signed: 09/23/2016 4:33:23  PM By: Baltazar Najjar MD Entered By: Baltazar Najjar on 09/23/2016 16:27:00 [name] ([id number]) -------------------------------------------------------------------------------- SuperBill Details Patient Name: [name] Date of Service: 09/23/2016 Medical Record Patient Account Number: [id number] Number: Treating RN: Phillis Haggis 17-Dec-1933 (82 y.o. Other Clinician: Date of Birth/Sex: Female) Treating Tram Wrenn Primary Care Provider: [name] Provider/Extender: G Referring Provider: Charolotte Capuchin in Treatment: 40 Diagnosis Coding ICD-10 Codes Code Description E11.621 Type 2 diabetes mellitus with foot ulcer L97.512 Non-pressure chronic ulcer of other part of right foot with fat layer exposed E11.51 Type 2 diabetes mellitus with diabetic peripheral angiopathy without gangrene Facility Procedures CPT4 Code Description: 78295621 11042 - DEB SUBQ TISSUE 20 SQ CM/< ICD-10 Description Diagnosis E11.621 Type 2 diabetes mellitus with foot ulcer L97.512 Non-pressure chronic ulcer of other part of right fo Modifier: ot with fat la Quantity: 1 yer exposed Physician Procedures CPT4 Code Description: 3086578 11042 - WC PHYS SUBQ TISS 20 SQ CM ICD-10 Description Diagnosis E11.621 Type 2 diabetes mellitus with foot ulcer L97.512 Non-pressure chronic ulcer of other part of right fo Modifier: ot with fat lay Quantity: 1 er exposed Electronic Signature(s) Signed: 09/23/2016 4:33:23 PM By: Baltazar Najjar MD Entered By: Baltazar Najjar on 09/23/2016 16:27:40

## 2016-09-24 NOTE — Progress Notes (Signed)
[name], [name] ([id number]) Visit Report for 09/23/2016 Arrival Information Details Patient Name: [name]. Date of Service: 09/23/2016 3:30 PM Medical Record Patient Account Number: [id number] Number: Treating RN: Phillis Haggis 05/18/1934 (83 y.o. Other Clinician: Date of Birth/Sex: Female) Treating ROBSON, MICHAEL Primary Care Kyrillos Adams: [name] Ayad Nieman/Extender: G Referring Chiyo Fay: Charolotte Capuchin in Treatment: 40 Visit Information History Since Last Visit All ordered tests and consults were completed: No Patient Arrived: Wheel Chair Added or deleted any medications: No Arrival Time: 15:35 Any new allergies or adverse reactions: No Accompanied By: son Had a fall or experienced change in No Transfer Assistance: EasyPivot Patient activities of daily living that may affect Lift risk of falls: Patient Identification Verified: Yes Signs or symptoms of abuse/neglect since last No Secondary Verification Process Yes visito Completed: Hospitalized since last visit: No Patient Requires Transmission- No Has Dressing in Place as Prescribed: Yes Based Precautions: Pain Present Now: No Patient Has Alerts: Yes Patient Alerts: Patient on Blood Thinner DMII Warfarin ABI Everest Bilateral NO BP RIGHT ARM Electronic Signature(s) Signed: 09/23/2016 4:45:04 PM By: Alejandro Mulling Entered By: Alejandro Mulling on 09/23/2016 15:36:01 [name] ([id number]) -------------------------------------------------------------------------------- Encounter Discharge Information Details Patient Name: [name]. Date of Service: 09/23/2016 3:30 PM Medical Record Patient Account Number: [id number] Number: Treating RN: Phillis Haggis 22-Dec-1933 (83 y.o. Other Clinician: Date of Birth/Sex: Female) Treating ROBSON, MICHAEL Primary Care Vana Arif: [name] Nethan Caudillo/Extender: G Referring Lanora Reveron: Charolotte Capuchin in Treatment:  53 Encounter Discharge Information Items Discharge Pain Level: 0 Discharge Condition: Stable Ambulatory Status: Wheelchair Discharge Destination: Nursing Home Transportation: Private Auto Accompanied By: son Schedule Follow-up Appointment: Yes Medication Reconciliation completed and provided to Patient/Care No [name]: Provided on Clinical Summary of Care: 09/23/2016 Form Type Recipient Paper Patient [facility] Electronic Signature(s) Signed: 09/23/2016 4:45:04 PM By: Alejandro Mulling Previous Signature: 09/23/2016 4:34:19 PM Version By: Gwenlyn Perking Entered By: Alejandro Mulling on 09/23/2016 16:40:14 [name] ([id number]) -------------------------------------------------------------------------------- Lower Extremity Assessment Details Patient Name: [name] Date of Service: 09/23/2016 3:30 PM Medical Record Patient Account Number: [id number] Number: Treating RN: Phillis Haggis 08-07-1933 (83 y.o. Other Clinician: Date of Birth/Sex: Female) Treating ROBSON, MICHAEL Primary Care Zion Lint: [name] Mineola Duan/Extender: G Referring Ginnie Marich: Charolotte Capuchin in Treatment: 40 Vascular Assessment Pulses: Dorsalis Pedis Palpable: [Right:Yes] Posterior Tibial Extremity colors, hair growth, and conditions: Extremity Color: [Right:Hyperpigmented] Temperature of Extremity: [Right:Warm] Capillary Refill: [Right:< 3 seconds] Toe Nail Assessment Left: Right: Thick: Yes Discolored: No Deformed: Yes Improper Length and Hygiene: No Electronic Signature(s) Signed: 09/23/2016 4:45:04 PM By: Alejandro Mulling Entered By: Alejandro Mulling on 09/23/2016 15:50:38 [name] ([id number]) -------------------------------------------------------------------------------- Multi Wound Chart Details Patient Name: [name] Date of Service: 09/23/2016 3:30 PM Medical Record Patient Account Number: [id number] Number: Treating RN: Phillis Haggis 1933-07-01 (83 y.o. Other Clinician: Date of Birth/Sex: Female) Treating ROBSON, MICHAEL Primary Care Anjolaoluwa Siguenza: [name] Jeziah Kretschmer/Extender: G Referring Carol Loftin: Charolotte Capuchin in Treatment: 40 Vital Signs Height(in): 65 Pulse(bpm): 80 Weight(lbs): 160 Blood Pressure 112/43 (mmHg): Body Mass Index(BMI): 27 Temperature(F): Respiratory Rate 16 (breaths/min): Photos: [20:No Photos] [9:No Photos] [N/A:N/A] Wound Location: [20:Right Toe Great] [9:Right Achilles] [N/A:N/A] Wounding Event: [20:Gradually Appeared] [9:Gradually Appeared] [N/A:N/A] Primary Etiology: [20:Atypical] [9:Diabetic Wound/Ulcer of N/A the Lower Extremity] Secondary Etiology: [20:N/A] [9:Arterial Insufficiency Ulcer N/A] Comorbid History: [20:Arrhythmia, Congestive Heart Failure, Hypertension, Type II Diabetes] [9:Arrhythmia, Congestive N/A Heart Failure, Hypertension, Type II Diabetes] Date Acquired: [20:09/09/2016] [9:06/02/2015] [N/A:N/A] Weeks of Treatment: [20:2] [9:40] [N/A:N/A] Wound Status: [  20:Open] [9:Open] [N/A:N/A] Pending Amputation on No [9:Yes] [N/A:N/A] Presentation: Measurements L x W x D 1.3x0.9x0.1 [9:1x0.9x0.1] [N/A:N/A] (cm) Area (cm) : [20:0.919] [9:0.707] [N/A:N/A] Volume (cm) : [20:0.092] [9:0.071] [N/A:N/A] % Reduction in Area: [20:-62.70%] [9:97.80%] [N/A:N/A] % Reduction in Volume: -61.40% [9:99.30%] [N/A:N/A] Classification: [20:Partial Thickness] [9:Grade 2] [N/A:N/A] HBO Classification: [20:Grade 1] [9:N/A] [N/A:N/A] Exudate Amount: [20:Large] [9:Large] [N/A:N/A] Exudate Type: [20:Serous] [9:Serosanguineous] [N/A:N/A] Exudate Color: [20:amber] [9:red, brown] [N/A:N/A] Wound Margin: [20:Distinct, outline attached] [9:Distinct, outline attached] [N/A:N/A] Granulation Amount: [20:None Present (0%)] [9:Small (1-33%)] [N/A:N/A] Granulation Quality: N/A Red N/A Necrotic Amount: Large (67-100%) Large (67-100%) N/A Necrotic Tissue: Eschar Eschar, Adherent Slough  N/A Epithelialization: None N/A N/A Debridement: Debridement (11914- Debridement (78295- N/A 11047) 11047) Pre-procedure 15:55 15:55 N/A Verification/Time Out Taken: Pain Control: Lidocaine 4% Topical Lidocaine 4% Topical N/A Solution Solution Tissue Debrided: Fibrin/Slough, Exudates, Fibrin/Slough, Exudates, N/A Subcutaneous Subcutaneous Level: Skin/Subcutaneous Skin/Subcutaneous N/A Tissue Tissue Debridement Area (sq 1.17 0.9 N/A cm): Instrument: Curette Curette N/A Bleeding: Minimum Minimum N/A Hemostasis Achieved: Pressure Pressure N/A Procedural Pain: 0 0 N/A Post Procedural Pain: 0 0 N/A Debridement Treatment Procedure was tolerated Procedure was tolerated N/A Response: well well Post Debridement 1.3x0.9x0.1 1x0.9x0.1 N/A Measurements L x W x D (cm) Post Debridement 0.092 0.071 N/A Volume: (cm) Periwound Skin Texture: No Abnormalities Noted No Abnormalities Noted N/A Periwound Skin No Abnormalities Noted No Abnormalities Noted N/A Moisture: Periwound Skin Color: No Abnormalities Noted No Abnormalities Noted N/A Temperature: No Abnormality No Abnormality N/A Tenderness on Yes Yes N/A Palpation: Wound Preparation: Ulcer Cleansing: Ulcer Cleansing: N/A Rinsed/Irrigated with Rinsed/Irrigated with Saline Saline Topical Anesthetic Topical Anesthetic Applied: Other: lidocaine Applied: Other: lidocaine 4% 4% Procedures Performed: Debridement Debridement N/A Treatment Notes Wound #20 (Right Toe Great) 1. Cleansed with: Clean wound with Normal Saline MARGARETTA, CHITTUM. ([id number]) 2. Anesthetic Topical Lidocaine 4% cream to wound bed prior to debridement 4. Dressing Applied: Santyl Ointment 5. Secondary Dressing Applied Dry Gauze Wound #9 (Right Achilles) 1. Cleansed with: Clean wound with Normal Saline 2. Anesthetic Topical Lidocaine 4% cream to wound bed prior to debridement 4. Dressing Applied: Hydrafera Blue 5. Secondary Dressing Applied ABD  Pad Kerlix/Conform Notes kerlix, coban; gauze and conform Electronic Signature(s) Signed: 09/23/2016 4:33:23 PM By: Baltazar Najjar MD Entered By: Baltazar Najjar on 09/23/2016 16:20:45 [name] ([id number]) -------------------------------------------------------------------------------- Multi-Disciplinary Care Plan Details Patient Name: AAVA, DELAND. Date of Service: 09/23/2016 3:30 PM Medical Record Patient Account Number: [id number] Number: Treating RN: Phillis Haggis 26-Sep-1933 (82 y.o. Other Clinician: Date of Birth/Sex: Female) Treating ROBSON, MICHAEL Primary Care Fedora Knisely: [name] Jasma Seevers/Extender: G Referring Barth Trella: Charolotte Capuchin in Treatment: 40 Active Inactive ` Abuse / Safety / Falls / Self Care Management Nursing Diagnoses: Impaired physical mobility Potential for falls Goals: Patient will remain injury free Date Initiated: 12/17/2015 Target Resolution Date: 08/29/2016 Goal Status: Active Interventions: Assess fall risk on admission and as needed Notes: ` Necrotic Tissue Nursing Diagnoses: Impaired tissue integrity related to necrotic/devitalized tissue Knowledge deficit related to management of necrotic/devitalized tissue Goals: Necrotic/devitalized tissue will be minimized in the wound bed Date Initiated: 02/04/2016 Target Resolution Date: 08/29/2016 Goal Status: Active Interventions: Assess patient pain level pre-, during and post procedure and prior to discharge Provide education on necrotic tissue and debridement process Treatment Activities: Apply topical anesthetic as ordered : 02/04/2016 AMYRI, FRENZ ([id number]) Notes: ` Wound/Skin Impairment Nursing Diagnoses: Impaired tissue integrity Goals: Patient/caregiver will verbalize understanding of skin care regimen Date Initiated: 12/17/2015 Target Resolution Date: 08/29/2016 Goal Status: Active  Ulcer/skin breakdown will have a volume reduction of 30% by  week 4 Date Initiated: 12/17/2015 Target Resolution Date: 08/29/2016 Goal Status: Active Ulcer/skin breakdown will have a volume reduction of 50% by week 8 Date Initiated: 12/17/2015 Target Resolution Date: 08/29/2016 Goal Status: Active Ulcer/skin breakdown will have a volume reduction of 80% by week 12 Date Initiated: 12/17/2015 Target Resolution Date: 08/29/2016 Goal Status: Active Ulcer/skin breakdown will heal within 14 weeks Date Initiated: 12/17/2015 Target Resolution Date: 08/29/2016 Goal Status: Active Interventions: Assess patient/caregiver ability to obtain necessary supplies Assess patient/caregiver ability to perform ulcer/skin care regimen upon admission and as needed Assess ulceration(s) every visit Notes: Electronic Signature(s) Signed: 09/23/2016 4:45:04 PM By: Alejandro Mulling Entered By: Alejandro Mulling on 09/23/2016 15:51:45 [name] ([id number]) -------------------------------------------------------------------------------- Pain Assessment Details Patient Name: [name] Date of Service: 09/23/2016 3:30 PM Medical Record Patient Account Number: [id number] Number: Treating RN: Phillis Haggis 1934-05-15 (82 y.o. Other Clinician: Date of Birth/Sex: Female) Treating ROBSON, MICHAEL Primary Care Vitaliy Eisenhour: [name] Rogue Pautler/Extender: G Referring Paisley Grajeda: Charolotte Capuchin in Treatment: 40 Active Problems Location of Pain Severity and Description of Pain Patient Has Paino No Site Locations With Dressing Change: No Pain Management and Medication Current Pain Management: Electronic Signature(s) Signed: 09/23/2016 4:45:04 PM By: Alejandro Mulling Entered By: Alejandro Mulling on 09/23/2016 15:36:21 [name] ([id number]) -------------------------------------------------------------------------------- Patient/Caregiver Education Details Patient Name: [name] Date of Service: 09/23/2016 3:30 PM Medical Record  Patient Account Number: [id number] Number: Treating RN: Phillis Haggis [birth year] (82 y.o. Other Clinician: Date of Birth/Gender: Female) Treating ROBSON, MICHAEL Primary Care Physician: [name] Physician/Extender: G Referring Physician: Charolotte Capuchin in Treatment: 63 Education Assessment Education Provided To: Patient Education Topics Provided Wound/Skin Impairment: Handouts: Other: change dressing as ordered Methods: Demonstration, Explain/Verbal Responses: State content correctly Electronic Signature(s) Signed: 09/23/2016 4:45:04 PM By: Alejandro Mulling Entered By: Alejandro Mulling on 09/23/2016 16:41:57 [name] ([id number]) -------------------------------------------------------------------------------- Wound Assessment Details Patient Name: [name] Date of Service: 09/23/2016 3:30 PM Medical Record Patient Account Number: [id number] Number: Treating RN: Phillis Haggis November 18, 1933 (82 y.o. Other Clinician: Date of Birth/Sex: Female) Treating ROBSON, MICHAEL Primary Care Perri Aragones: [name] Jaelon Gatley/Extender: G Referring Mlissa Tamayo: Charolotte Capuchin in Treatment: 40 Wound Status Wound Number: 20 Primary Atypical Etiology: Wound Location: Right Toe Great Wound Open Wounding Event: Gradually Appeared Status: Date Acquired: 09/09/2016 Comorbid Arrhythmia, Congestive Heart Failure, Weeks Of Treatment: 2 History: Hypertension, Type II Diabetes Clustered Wound: No Photos Photo Uploaded By: Alejandro Mulling on 09/23/2016 16:43:25 Wound Measurements Length: (cm) 1.3 Width: (cm) 0.9 Depth: (cm) 0.1 Area: (cm) 0.919 Volume: (cm) 0.092 % Reduction in Area: -62.7% % Reduction in Volume: -61.4% Epithelialization: None Tunneling: No Undermining: No Wound Description Classification: Partial Thickness Foul O Diabetic Severity (Wagner): Grade 1 Slough Wound Margin: Distinct, outline attached Exudate  Amount: Large Exudate Type: Serous Exudate Color: amber dor After Cleansing: No /Fibrino No Wound Bed Granulation Amount: None Present (0%) Necrotic Amount: Large (67-100%) JARETZI, DROZ ([id number]) Necrotic Quality: Eschar Periwound Skin Texture Texture Color No Abnormalities Noted: No No Abnormalities Noted: No Moisture Temperature / Pain No Abnormalities Noted: No Temperature: No Abnormality Tenderness on Palpation: Yes Wound Preparation Ulcer Cleansing: Rinsed/Irrigated with Saline Topical Anesthetic Applied: Other: lidocaine 4%, Treatment Notes Wound #20 (Right Toe Great) 1. Cleansed with: Clean wound with Normal Saline 2. Anesthetic Topical Lidocaine 4% cream to wound bed prior to debridement 4. Dressing Applied: Santyl Ointment 5. Secondary Dressing Applied Dry Gauze Electronic Signature(s) Signed:  09/23/2016 4:45:04 PM By: Alejandro Mulling Entered By: Alejandro Mulling on 09/23/2016 15:49:48 [name] ([id number]) -------------------------------------------------------------------------------- Wound Assessment Details Patient Name: [name] Date of Service: 09/23/2016 3:30 PM Medical Record Patient Account Number: [id number] Number: Treating RN: Phillis Haggis [birth year] (82 y.o. Other Clinician: Date of Birth/Sex: Female) Treating ROBSON, MICHAEL Primary Care Brystol Wasilewski: [name] Stellah Donovan/Extender: G Referring Gerhart Ruggieri: Charolotte Capuchin in Treatment: 40 Wound Status Wound Number: 9 Primary Diabetic Wound/Ulcer of the Lower Etiology: Extremity Wound Location: Right Achilles Secondary Arterial Insufficiency Ulcer Wounding Event: Gradually Appeared Etiology: Date Acquired: 06/02/2015 Wound Open Weeks Of Treatment: 40 Status: Clustered Wound: No Comorbid Arrhythmia, Congestive Heart Pending Amputation On Presentation History: Failure, Hypertension, Type II Diabetes Photos Photo Uploaded By: Alejandro Mulling on  09/23/2016 16:43:25 Wound Measurements Length: (cm) 1 Width: (cm) 0.9 Depth: (cm) 0.1 Area: (cm) 0.707 Volume: (cm) 0.071 % Reduction in Area: 97.8% % Reduction in Volume: 99.3% Tunneling: No Undermining: No Wound Description Classification: Grade 2 Foul Odor Afte Wound Margin: Distinct, outline attached Slough/Fibrino Exudate Amount: Large Exudate Type: Serosanguineous Exudate Color: red, brown r Cleansing: No Yes Wound Bed SAKIYA, STEPKA ([id number]) Granulation Amount: Small (1-33%) Granulation Quality: Red Necrotic Amount: Large (67-100%) Necrotic Quality: Eschar, Adherent Slough Periwound Skin Texture Texture Color No Abnormalities Noted: No No Abnormalities Noted: No Moisture Temperature / Pain No Abnormalities Noted: No Temperature: No Abnormality Tenderness on Palpation: Yes Wound Preparation Ulcer Cleansing: Rinsed/Irrigated with Saline Topical Anesthetic Applied: Other: lidocaine 4%, Treatment Notes Wound #9 (Right Achilles) 1. Cleansed with: Clean wound with Normal Saline 2. Anesthetic Topical Lidocaine 4% cream to wound bed prior to debridement 4. Dressing Applied: Hydrafera Blue 5. Secondary Dressing Applied ABD Pad Kerlix/Conform Notes kerlix, coban; gauze and conform Electronic Signature(s) Signed: 09/23/2016 4:45:04 PM By: Alejandro Mulling Entered By: Alejandro Mulling on 09/23/2016 15:48:29 [name] ([id number]) -------------------------------------------------------------------------------- Vitals Details Patient Name: [name] Date of Service: 09/23/2016 3:30 PM Medical Record Patient Account Number: [id number] Number: Treating RN: Phillis Haggis 1933-08-21 (82 y.o. Other Clinician: Date of Birth/Sex: Female) Treating ROBSON, MICHAEL Primary Care Mirah Nevins: [name] Lindyn Vossler/Extender: G Referring Clester Chlebowski: Charolotte Capuchin in Treatment: 40 Vital Signs Time Taken: 15:36 Pulse (bpm):  80 Height (in): 65 Respiratory Rate (breaths/min): 16 Weight (lbs): 160 Blood Pressure (mmHg): 112/43 Body Mass Index (BMI): 26.6 Reference Range: 80 - 120 mg / dl Notes Pt states that she just got done with dialysis so that is why her BP is low and that is normal for her. I will let MD know. Electronic Signature(s) Signed: 09/23/2016 4:45:04 PM By: Alejandro Mulling Entered By: Alejandro Mulling on 09/23/2016 15:39:39

## 2016-09-28 LAB — ACID FAST CULTURE WITH REFLEXED SENSITIVITIES (MYCOBACTERIA): Acid Fast Culture: NEGATIVE

## 2016-09-30 ENCOUNTER — Encounter: Payer: Medicare Other | Attending: Internal Medicine | Admitting: Internal Medicine

## 2016-09-30 DIAGNOSIS — Z8673 Personal history of transient ischemic attack (TIA), and cerebral infarction without residual deficits: Secondary | ICD-10-CM | POA: Insufficient documentation

## 2016-09-30 DIAGNOSIS — Z794 Long term (current) use of insulin: Secondary | ICD-10-CM | POA: Insufficient documentation

## 2016-09-30 DIAGNOSIS — L97512 Non-pressure chronic ulcer of other part of right foot with fat layer exposed: Secondary | ICD-10-CM | POA: Insufficient documentation

## 2016-09-30 DIAGNOSIS — N186 End stage renal disease: Secondary | ICD-10-CM | POA: Insufficient documentation

## 2016-09-30 DIAGNOSIS — I132 Hypertensive heart and chronic kidney disease with heart failure and with stage 5 chronic kidney disease, or end stage renal disease: Secondary | ICD-10-CM | POA: Insufficient documentation

## 2016-09-30 DIAGNOSIS — Z992 Dependence on renal dialysis: Secondary | ICD-10-CM | POA: Insufficient documentation

## 2016-09-30 DIAGNOSIS — I482 Chronic atrial fibrillation: Secondary | ICD-10-CM | POA: Diagnosis not present

## 2016-09-30 DIAGNOSIS — I5033 Acute on chronic diastolic (congestive) heart failure: Secondary | ICD-10-CM | POA: Diagnosis not present

## 2016-09-30 DIAGNOSIS — E11621 Type 2 diabetes mellitus with foot ulcer: Secondary | ICD-10-CM | POA: Diagnosis present

## 2016-09-30 DIAGNOSIS — E1151 Type 2 diabetes mellitus with diabetic peripheral angiopathy without gangrene: Secondary | ICD-10-CM | POA: Insufficient documentation

## 2016-09-30 DIAGNOSIS — E1122 Type 2 diabetes mellitus with diabetic chronic kidney disease: Secondary | ICD-10-CM | POA: Insufficient documentation

## 2016-10-02 NOTE — Progress Notes (Signed)
[name], [name] ([id number]) Visit Report for 09/30/2016 Chief Complaint Document Details Patient Name: [name], [name]. Date of Service: 09/30/2016 8:00 AM Medical Record Patient Account Number: [id number] Number: Treating RN: Phillis Haggis 27-May-1934 (82 y.o. Other Clinician: Date of Birth/Sex: Female) Treating Gerad Cornelio Primary Care Provider: [name] Provider/Extender: G Referring Provider: Charolotte Capuchin in Treatment: 31 Information Obtained from: Patient Chief Complaint Ms. Balin presents today for follow-up evaluation of her diabetic foot ulcers and abdominal wound. Electronic Signature(s) Signed: 09/30/2016 5:52:53 PM By: Baltazar Najjar MD Entered By: Baltazar Najjar on 09/30/2016 09:02:01 [name] ([id number]) -------------------------------------------------------------------------------- Debridement Details Patient Name: [name] Date of Service: 09/30/2016 8:00 AM Medical Record Patient Account Number: [id number] Number: Treating RN: Phillis Haggis 28-Sep-1933 (82 y.o. Other Clinician: Date of Birth/Sex: Female) Treating Deovion Batrez Primary Care Provider: [name] Provider/Extender: G Referring Provider: Charolotte Capuchin in Treatment: 41 Debridement Performed for Wound #9 Right Achilles Assessment: Performed By: Physician Maxwell Caul, MD Debridement: Debridement Pre-procedure Yes - 08:31 Verification/Time Out Taken: Start Time: 08:32 Pain Control: Lidocaine 4% Topical Solution Level: Skin/Subcutaneous Tissue Total Area Debrided (L x 1 (cm) x 0.9 (cm) = 0.9 (cm) W): Tissue and other Viable, Non-Viable, Exudate, Fibrin/Slough, Subcutaneous material debrided: Instrument: Blade, Curette, Forceps Bleeding: Minimum Hemostasis Achieved: Pressure End Time: 08:36 Procedural Pain: 0 Post Procedural Pain: 0 Response to Treatment: Procedure was tolerated well Post Debridement  Measurements of Total Wound Length: (cm) 1.2 Width: (cm) 0.3 Depth: (cm) 0.1 Volume: (cm) 0.028 Character of Wound/Ulcer Post Requires Further Debridement Debridement: Severity of Tissue Post Debridement: Fat layer exposed Post Procedure Diagnosis Same as Pre-procedure Electronic Signature(s) Signed: 09/30/2016 4:37:12 PM By: Alejandro Mulling Signed: 09/30/2016 5:52:53 PM By: Baltazar Najjar MD [name] ([id number]) Entered By: Baltazar Najjar on 09/30/2016 09:01:49 [name] ([id number]) -------------------------------------------------------------------------------- HPI Details Patient Name: [name] Date of Service: 09/30/2016 8:00 AM Medical Record Patient Account Number: [id number] Number: Treating RN: Phillis Haggis [birth year] (82 y.o. Other Clinician: Date of Birth/Sex: Female) Treating Laiyla Slagel Primary Care Provider: [name] Provider/Extender: G Referring Provider: Charolotte Capuchin in Treatment: 35 History of Present Illness Location: right posterior heel, right Achilles, right lower quadrant abdomen, right fourth toe amp site Quality: denies pain to any wound Severity: not applicable Timing: denies pain HPI Description: 83 year old patient who is known to be diabetic, was referred to [name] by Dr. Gavin Potters for a right heel ulceration which she's had for a while. She was recently in hospital for a pneumonia and at that time and got delirious and was disoriented and sometime during this time developed a stage II ulcer on her right heel. Her past medical history is significant for bilateral pneumonia which was treated with injectable antibiotics and then to oral Levaquin which he has completed. She also has acute on chronic diastolic CHF, acute on chronic respiratory failure, end-stage renal disease on hemodialysis, atrial fibrillation, recent stroke, diabetes mellitus. The patient and her son are poor historians but from  what I understand she was admitted to the hospital with an acute vascular compromise of her right lower extremity and Dr. Wyn Quaker has done a surgical procedure and we are trying to obtain these notes. There are also some vascular workup done and we will try and obtain these notes. the injury to the left lower quadrant of abdomen and the suprapubic area have been there due to a bruise and have been there for several months and no intervention has  been done. 10/11/2015 -- on review of the electronics records it was noted that the patient was admitted to the hospital on 09/14/2015 with peripheral vascular disease with claudication, end-stage renal disease, pressure ulcer, chronic atrial fibrillation. She was seen by Dr. Wyn Quaker who did her right lower extremity angiogram , angioplasty of the right anterior tibial artery and thrombolysis with TPA of the right popliteal artery, and thrombectomy. She was seen by Dr. Wyn Quaker during this past week and he was pleased with the progress. He did say that if he took her to the operating room for any procedure he would debride the abdominal wound under anesthesia. She was also seen by Dr. Ether Griffins the podiatrist who thought that she may lose her right fourth toe at some stage may need an amputation of this. 10/21/2015 --patient known to Dr. Wyn Quaker and his last office visit from 10/04/2015 has been reviewed. 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 She also had a persistent ulceration of the right heel and markedly this area and a right third and fourth toe and a small scab on the calf but these are dry and seemed to be improving. Patient also has a left carotid endarterectomy and multiple interventions to a right brachiocephalic AV fistula. After the visit he had recommended noninvasive studies to recheck her revascularization. He was off the impression  that she would likely lose the right fourth toe and the third toe was likely to heal. [name] ([id number]) He was concerned about underlying muscle necrosis on her right heel and midfoot. 11/01/2015 -- an echo done in January of this year showed her left ventricular ejection fraction to be about 50-55%. The patient was seen by the PA and Dr. Driscilla Grammes office and the plan was to take her to the operating room soon to have a debridement under anesthesia for the abdominal wall wound, the Achilles tendon on the right leg and amputation of the right fourth toe. The daughter and the patient do not feel that they would be able to undergo hyperbaric oxygen therapy 5 days a week for 6 weeks. 12/17/15; this is a medically complex woman who I note was recently in this clinic however I was not involved with her care. She returns today with multiple wounds; a) she has a wound in the mid abdomen that is been there since March of this year. I note that she is been to the overall for debridement recently. The exact etiology of this wound is not really clear b) left lower quadrant abdominal wound had some sanguinous drainage when she came in here. The patient fell in January and thinks this may have been secondary to a hematoma. c): The patient has 3 wounds on her right leg including a small wound on the right mid calf, a large area over the Achilles which currently has a wound VAC for the last 6 weeks, also a smaller wound on the distal part of the right heel. As far as I understand most of these wounds are currently been dressed with's calcium alginate. According to her daughter the Achilles wound under the wound VAC is doing well d) the patient is had an amputation of her left fifth toe in January and the right fourth toe 6 weeks ago secondary to diabetic PAD e) the patient has chronic renal failure on dialysis for the last 2 years secondary to type 2 diabetes on insulin. The  daughter's knowledge there  is been no biopsy of the abdominal wounds given their current appearance and lack of undefined etiology at have to wonder about calciphylaxis. 12/18/15:Addendum; I have reviewed [facility] I can see no relevant x-rays of the right heel. I note her arteriogram and revascularization of her right lower extremity in April 2017. She had debridement of both abdominal wounds and the right heel and Achilles wound on 11/07/15 at which time she had a right fourth toe ray amputation. The abdominal wounds were debridement again on 6/29. I do not see any relevant pathology of these abdominal wounds 12/24/15; culture I did of the drainage from the midline abdominal wound last week showed both Proteus and ampicillin sensitive enterococcus. I've given her a course of Augmentin adjusted on dialysis days. She has no specific complaints today. Been using Santyl to the abdominal wounds in the right leg wound and the wound VAC on the right Achilles which was initially prescribed by Dr. dew 12/31/15; I have done two punch biopsies of the large midline abdominal. 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 May have been a trauma component of the one on the left lower quadrant however the midline wound had no such history. She has a large area on the right Achilles heel with a wound VAC prescribed by Dr. dew. A small wound on the right anterior leg.Marland Kitchen UNFORTUNATELY she has 2 new wounds today. One on the left heel which is probably a pressure area. As well her previous amputation site of her right fourth toe has dehisced and now has a small wound with significant depth at the amputation site. 01/14/2016 -- she returns after 2 weeks and had had a punch biopsy of abdominal wound done the last visit -- had a biopsy of her midline abdominal wound done and the Pathology diagnosis is that of ulceration, necrosis and inflammation and negative for dysplasia and malignancy. 01/21/16. 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 In the meantime she has new wounds of the left heel the right fourth toe amputation site is opened up. The back is stopped to the right heel area. 01/28/16; the abdominal wounds continued to improve. The extensive wound on her right Achilles also looks stable except for the lower aspect of the wound where there is a large liquefied area that probes right down to her calcaneus. This cultured Proteus last week I have her on Augmentin and doxycycline 1. I think this is [name], [name] ([id number]) going to need a course of IV antibiotics and I will call dialysis. X-ray I did last week was negative, I think she is going to need an MRI 02/04/16; right heel MRI as before Saturday. Receiving I believe IV Rocephin at dialysis 02/11/16; as it turns out the patient could not have a MRI as she has a bladder stimulator in place even though it is not currently in use since the beginning of this year. Although she has an allergy to IV contrast she apparently has done well with premedication so we will have to go for a CT scan with contrast. In the meantime she has had a fall now has a large skin tear on her left upper arm. She went to the ER and they suggested Tegaderm over topical antibiotics 03/03/16 currently patient returns after having been hospitalized for 2 weeks and subsequently transferred to [facility] nursing facility. She actually seems to be doing excellent compared to even when we last saw her  according to our nursing staff. Both patient and her daughter are extremely encouraged at how well she is presenting at this point in time. Overall the biggest issue is still the right Achilles area which is being managed at this point in time by Dr. Wyn Quaker. Patient is currently utilizing a wound VAC to that region. 03/17/16; patient is at [facility] nursing home still. Using Aquacel Ag to the wounds on her bilateral feet and still Prisma to the small open area  on her abdomen. 03/31/16 at this point in time patient has been tolerating the dressing changes currently. She fortunately has no worsening of her symptoms although she tells me that the nurse who is caring for her at [facility] nursing facility decided that nothing was needed in regard to the lower abdominal wound from a dressing standpoint at this time. she is really not having significant discomfort or pain at this point she continues to have some tunneling in the proximal Achilles wound region. 04/07/16 patient continues to do well on evaluation today. Even the Achilles wound which has been the most tender is not giving her as much trouble. Unfortunately the PolyMem dressings that we utilize last week really did not seem to benefit her in particular. Obviously we will discontinue that at this point in time today. 04-15-16:Ms. Londo is accompanied today by her daughter. She is still residing in an SNF undergoing dialysis, continues to receive antibiotics during dialysis as prescribed by Dr. Sampson Goon of infectious disease. She has a follow-up appointment with Dr. Sampson Goon on 04-17-16 to discuss the continuation of these antibiotics. Ms. Cubillos denies any pain to any of the 3 remaining wounds she does admit to changing to [employer] surgical shoes for safety while ambulating with physical therapy. She denies any falls since her last appointment here although she and her daughter do admit to increased tremors of unclear etiology since her last appointment. She has tolerated the dressing changes that were prescribed last week. 04-22-16 Ms. Grilliot presents today accompanied by her daughter for evaluation of her diabetic foot ulcers. She is still residing in an SNF, she continues dialysis 3 times weekly. She'll follow up with Dr. Sampson Goon last Friday, and at that appointment IV antibiotics were discontinued. According to Ms. Cavallero and her daughter if there is any regression of her wounds Dr.  Sampson Goon will consider re-starting the antibiotics. Ms. Cohoon daughter states that since the discontinuation of antibiotic therapy her "twitches" have resolved. 04/29/16; I have not seen this patient in quite some time however she is completed triple antibiotic therapy given at dialysis for osteomyelitis as prescribed by infectious disease. She is still being followed by Dr. dew of vascular surgery. We are using Hydrofera Blue to the surface of these wounds. She currently has 2 open areas a substantial area over her Achilles area although this is a lot better than the last time I saw this. She also has a small wound superiorly from this wound which has a superior probing depth of 2 cm. 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 My biopsy did not prove this nevertheless I think this is the correct clinical diagnosis. We are now following her for an area on the right Achilles part of her heel. This is not any different from last week. [name], [name] ([id number]) She  also has a small tunneling area just above this. And unfortunately this week there is been a reopening of an area where her right fourth toe was previously amputated. She is completed antibiotics 05/13/16; she has a new reopening on the mid abdominal wound in the same site is previously. This is a small open area. Apligraf #1 today to the areas on the right heel o2 still an open area in the fourth toe amputation site 05/27/16; the areas on her abdomen are completely closed over. Small open area from last time is closed. Apligraf #2 today to the areas on the right heel. The 4th toe amputation site is also heel 06/10/16; we did not have an Apligraf to put on today. This appeared tunneling wound on the right  posterior calf appears to be closed. The more substantial area on her Achilles itself is improved. 06/24/16; I reapplied her third Apligraf today although we have not had one debridement last time. The original tunneling wound is not as closed as last time. The more substantial area on her Achilles itself is improved with advancing epithelialization 07/08/16 Apligraf #4 today. The original long area over her Achilles as a healthy-looking area at the superior aspect and a small divot inferiorly. Both of the wound surface is look healthy 07/22/16; patient arrived today with a 2 original wounds on the Achilles part of her ankle too small for another application of Apligraf. This is on the right Achilles area. The small divot inferiorly looks as though it may have skin over most of this and I can't really see an open area. She did have an erythematous area over the medial malleolus with some drainage this looks like a rapid injury not cellulitis 07/29/16; erythematous area over the medial malleolus seems a lot better this week. This was a wrap injury. The areas on the right Achilles is still a small divot. The area superiorly down somewhat in size. 08/05/16; condition is generally not feeling well. No major changed either wound. Using Prisma 08/19/16; patient was last here she was admitted to hospital last week. Apparently she was complaining mostly of abdominal pain however a CT scan of the abdomen and pelvis revealed changes in the L2-L3 interspace suspicious for discitis and osteomyelitis as well as changes in the adjacent paraspinal and psoas muscles. She was seen by infectious disease Dr. Sampson Goon. Her urine culture grew group B strep. An aspirate of the fluid at L2-L3 I reviewed today. Her AFB Gram stain was negative, fungal culture is still in place. C + S is negative so far as of today. She is on vancomycin and ceftazidine dosed at dialysis for 6 weeks. She is not complaining of fever or back pain  currently. We have been using [employer] and to her wounds 08/26/16; patient is still receiving IV antibiotics at dialysis. She does not feel well complaining of a lot of pain in the right hip radiating down her leg. She rubs the lateral aspect of the right hip over the greater trochanter. Her wound measurements were a lot of different this week although I don't think they were accurately recorded last week the wound on the right Achilles area looks about the same. Superficial without any depth. 09/09/16; patient is still receiving IV antibiotics at dialysis chide believe are vancomycin and ceftazidine. Since she was last here she is developed a new wound on the tip of her right great toe. The right Achilles wound had significant surface slough/necrotic material on arrival today Also of note  the patient seems to be developing widespread myoclonic jerking which is gotten a lot worse over the last week to 2 although our intake nurse and the patient's daughter both state that she had had this at certain times in the past I had not specifically noticed this. Apparently at one point this was due to phosphorus abnormalities which were corrected at dialysis. I have reviewed her medication list I don't see any usual offenders except she is on Paxil Neurontin and Ultram although she takes the Ultram sparingly maybe once a week 09/15/16; still on vancomycin and ceftazidine as I understand things at dialysis. The area that was on the tip of her right great toe identified last week looks like an ischemic wound. She is known diabetic PAD and has had previous revascularization in 2017 by Dr. Wyn Quaker. It is possible she may need to see him again. I will probably attempt debridement of this next week we are using topical Santyl. In the meantime her heel wound has closed superiorly she is only left with a small wound at the inferior part of the Achilles area which was her deeper part of this wound at one point  however even this area looks better 09/23/16; the patient has been to see Dr. Sampson Goon and I gather because of the myoclonic jerking he is [name], [name] ([id number]) changed one of her antibiotics probably the ceftazidine. Concerning today is the tip of her right great toe which seems to be an expanding necrotic area. She has known severe PAD and this looks like an ischemic wound to me. She has previously been revascularized by Dr.Dew in nearly part of 2017. We continue to make reasonable progression with the heel wound using Hydrofera Blue. We are using Prisma to the great toe on the right. 09/30/16; the patient looks a lot better today apparently her Rocephin was stopped also perhaps the vancomycin. She states she didn't get any antibiotics at dialysis. We have been using Hydrofera Blue to the heel area and Santyl to the right great toe. She has a follow-up with Dr. dew on 5/10with regards to the right toe wound which looks ischemic Electronic Signature(s) Signed: 09/30/2016 5:52:53 PM By: Baltazar Najjar MD Entered By: Baltazar Najjar on 09/30/2016 09:03:06 [name] ([id number]) -------------------------------------------------------------------------------- Physical Exam Details Patient Name: [name] Date of Service: 09/30/2016 8:00 AM Medical Record Patient Account Number: [id number] Number: Treating RN: Phillis Haggis [birth year] (82 y.o. Other Clinician: Date of Birth/Sex: Female) Treating Breya Cass Primary Care Provider: [name] Provider/Extender: G Referring Provider: Charolotte Capuchin in Treatment: 48 Constitutional Sitting or standing Blood Pressure is within target range for patient.. Pulse regular and within target range for patient.Marland Kitchen Respirations regular, non-labored and within target range.. Temperature is normal and within the target range for the patient.. Patient's appearance is neat and clean. Appears in no acute distress.  Well nourished and well developed.. Notes Wound exam; I removed a copious amount of necrotic eschar and underlying nonviable tissue from the Achilles area using a #5 curet and then pickups and a scalpel. After this debridement there is only a small superficial area remaining. oThe area over the tip aright great toe again has a black surface over this. This looks ischemic I did not attempt any further mechanical debridement of this but I think continuing Santyl as the treatment of choice until we can get a vascular review Electronic Signature(s) Signed: 09/30/2016 5:52:53 PM By: Baltazar Najjar MD Entered By: Baltazar Najjar on 09/30/2016 09:04:23 [name],  [name] ([id number]) -------------------------------------------------------------------------------- Physician Orders Details Patient Name: MALISA, RUGGIERO. Date of Service: 09/30/2016 8:00 AM Medical Record Patient Account Number: [id number] Number: Treating RN: Phillis Haggis 15-Sep-1933 (82 y.o. Other Clinician: Date of Birth/Sex: Female) Treating Delmy Holdren Primary Care Provider: [name] Provider/Extender: G Referring Provider: Charolotte Capuchin in Treatment: 55 Verbal / Phone Orders: Yes Clinician: Ashok Cordia, Debi Read Back and Verified: Yes Diagnosis Coding Wound Cleansing Wound #20 Right Toe Great o Clean wound with Normal Saline. o Cleanse wound with mild soap and water o May shower with protection. Wound #9 Right Achilles o Clean wound with Normal Saline. o Cleanse wound with mild soap and water o May shower with protection. Anesthetic Wound #20 Right Toe Great o Topical Lidocaine 4% cream applied to wound bed prior to debridement - all wounds for clinic use only Wound #9 Right Achilles o Topical Lidocaine 4% cream applied to wound bed prior to debridement - all wounds for clinic use only Primary Wound Dressing Wound #20 Right Toe Great o Santyl Ointment Wound #9  Right Achilles o Hydrafera Blue Secondary Dressing Wound #20 Right Toe Great o Dry Gauze o Kerlix and Coban Wound #9 Right Achilles o ABD pad o Conform/Kerlix [name], [name] ([id number]) Dressing Change Frequency Wound #20 Right Toe Great o Change dressing every other day. Wound #9 Right Achilles o Change dressing every other day. Follow-up Appointments Wound #20 Right Toe Great o Return Appointment in 1 week. Wound #9 Right Achilles o Return Appointment in 1 week. Edema Control Wound #9 Right Achilles o Kerlix and Coban - Right Lower Extremity - do not wrap too tight and not the whole leg o Elevate legs to the level of the heart and pump ankles as often as possible Off-Loading Wound #20 Right Toe Great o Other: - Float heels Wound #9 Right Achilles o Other: - Float heels Electronic Signature(s) Signed: 09/30/2016 4:37:12 PM By: Alejandro Mulling Signed: 09/30/2016 5:52:53 PM By: Baltazar Najjar MD Entered By: Alejandro Mulling on 09/30/2016 08:31:13 [name] ([id number]) -------------------------------------------------------------------------------- Problem List Details Patient Name: [name]. Date of Service: 09/30/2016 8:00 AM Medical Record Patient Account Number: [id number] Number: Treating RN: Phillis Haggis 1933/10/01 (82 y.o. Other Clinician: Date of Birth/Sex: Female) Treating Jefferson Fullam Primary Care Provider: [name] Provider/Extender: G Referring Provider: Charolotte Capuchin in Treatment: 41 Active Problems ICD-10 Encounter Code Description Active Date Diagnosis E11.621 Type 2 diabetes mellitus with foot ulcer 12/17/2015 Yes L97.512 Non-pressure chronic ulcer of other part of right foot with 03/31/2016 Yes fat layer exposed E11.51 Type 2 diabetes mellitus with diabetic peripheral 12/17/2015 Yes angiopathy without gangrene Inactive Problems ICD-10 Code Description Active Date Inactive  Date M86.671 Other chronic osteomyelitis, right ankle and foot 04/15/2016 04/15/2016 S31.104A Unspecified open wound of abdominal wall, left lower 12/17/2015 12/17/2015 quadrant without penetration into peritoneal cavity, initial encounter S31.103D Unspecified open wound of abdominal wall, right lower 04/15/2016 04/15/2016 quadrant without penetration into peritoneal cavity, subsequent encounter Resolved Problems Electronic Signature(s) RHANDA, LEMIRE ([id number]) Signed: 09/30/2016 5:52:53 PM By: Baltazar Najjar MD Entered By: Baltazar Najjar on 09/30/2016 09:01:30 [name] ([id number]) -------------------------------------------------------------------------------- Progress Note Details Patient Name: [name] Date of Service: 09/30/2016 8:00 AM Medical Record Patient Account Number: [id number] Number: Treating RN: Phillis Haggis Feb 06, 1934 (82 y.o. Other Clinician: Date of Birth/Sex: Female) Treating Kyleigh Nannini Primary Care Provider: [name] Provider/Extender: G Referring Provider: Charolotte Capuchin in Treatment: 20 Subjective Chief Complaint Information obtained from Patient Ms.  Smithey presents today for follow-up evaluation of her diabetic foot ulcers and abdominal wound. History of Present Illness (HPI) The following HPI elements were documented for the patient's wound: Location: right posterior heel, right Achilles, right lower quadrant abdomen, right fourth toe amp site Quality: denies pain to any wound Severity: not applicable Timing: denies pain [age] year old patient who is known to be diabetic, was referred to us by Dr. Gavin PottersGrandis for a right heel ulceration which she's had for a while. She was recently in hospital for a pneumonia and at that time and got delirious and was disoriented and sometime during this time developed a stage II ulcer on her right heel. Her past medical history is significant for bilateral pneumonia which was  treated with injectable antibiotics and then to oral Levaquin which he has completed. She also has acute on chronic diastolic CHF, acute on chronic respiratory failure, end-stage renal disease on hemodialysis, atrial fibrillation, recent stroke, diabetes mellitus. The patient and her son are poor historians but from what I understand she was admitted to the hospital with an acute vascular compromise of her right lower extremity and Dr. Wyn Quakerew has done a surgical procedure and we are trying to obtain these notes. There are also some vascular workup done and we will try and obtain these notes. the injury to the left lower quadrant of abdomen and the suprapubic area have been there due to a bruise and have been there for several months and no intervention has been done. 10/11/2015 -- on review of the electronics records it was noted that the patient was admitted to the hospital on 09/14/2015 with peripheral vascular disease with claudication, end-stage renal disease, pressure ulcer, chronic atrial fibrillation. She was seen by Dr. Wyn Quakerew who did her right lower extremity angiogram , angioplasty of the right anterior tibial artery and thrombolysis with TPA of the right popliteal artery, and thrombectomy. She was seen by Dr. Wyn Quakerew during this past week and he was pleased with the progress. He did say that if he took her to the operating room for any procedure he would debride the abdominal wound under anesthesia. She was also seen by Dr. Ether GriffinsFowler the podiatrist who thought that she may lose her right fourth toe at some stage may need an amputation of this. Natalie RimaHOPKINS, Rashiya Y. ([id number]) 10/21/2015 --patient known to Dr. Wyn Quakerew and his last office visit from 10/04/2015 has been reviewed. 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 She also had a persistent ulceration of the right heel and markedly  this area and a right third and fourth toe and a small scab on the calf but these are dry and seemed to be improving. Patient also has a left carotid endarterectomy and multiple interventions to a right brachiocephalic AV fistula. After the visit he had recommended noninvasive studies to recheck her revascularization. He was off the impression that she would likely lose the right fourth toe and the third toe was likely to heal. He was concerned about underlying muscle necrosis on her right heel and midfoot. 11/01/2015 -- an echo done in January of this year showed her left ventricular ejection fraction to be about 50-55%. The patient was seen by the PA and Dr. Driscilla Grammesdew's office and the plan was to take her to the operating room soon to have a debridement under anesthesia for the abdominal wall wound, the Achilles tendon on the right leg and amputation of  the right fourth toe. The daughter and the patient do not feel that they would be able to undergo hyperbaric oxygen therapy 5 days a week for 6 weeks. 12/17/15; this is a medically complex woman who I note was recently in this clinic however I was not involved with her care. She returns today with multiple wounds; a) she has a wound in the mid abdomen that is been there since March of this year. I note that she is been to the overall for debridement recently. The exact etiology of this wound is not really clear b) left lower quadrant abdominal wound had some sanguinous drainage when she came in here. The patient fell in January and thinks this may have been secondary to a hematoma. c): The patient has 3 wounds on her right leg including a small wound on the right mid calf, a large area over the Achilles which currently has a wound VAC for the last 6 weeks, also a smaller wound on the distal part of the right heel. As far as I understand most of these wounds are currently been dressed with's calcium alginate. According to her daughter the Achilles  wound under the wound VAC is doing well d) the patient is had an amputation of her left fifth toe in January and the right fourth toe 6 weeks ago secondary to diabetic PAD e) the patient has chronic renal failure on dialysis for the last 2 years secondary to type 2 diabetes on insulin. The daughter's knowledge there is been no biopsy of the abdominal wounds given their current appearance and lack of undefined etiology at have to wonder about calciphylaxis. 12/18/15:Addendum; I have reviewed [facility] I can see no relevant x-rays of the right heel. I note her arteriogram and revascularization of her right lower extremity in April 2017. She had debridement of both abdominal wounds and the right heel and Achilles wound on 11/07/15 at which time she had a right fourth toe ray amputation. The abdominal wounds were debridement again on 6/29. I do not see any relevant pathology of these abdominal wounds 12/24/15; culture I did of the drainage from the midline abdominal wound last week showed both Proteus and ampicillin sensitive enterococcus. I've given her a course of Augmentin adjusted on dialysis days. She has no specific complaints today. Been using Santyl to the abdominal wounds in the right leg wound and the wound VAC on the right Achilles which was initially prescribed by Dr. dew 12/31/15; I have done two punch biopsies of the large midline abdominal. 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 May have been a trauma component of the one on the left lower quadrant however the midline wound had no such history. She has a large area on the right Achilles heel with a wound VAC prescribed by Dr. dew. A small wound on the right anterior leg.Marland Kitchen UNFORTUNATELY she has 2 new wounds today. One on the left heel which is probably a pressure area. As well her previous amputation site of her right fourth toe has dehisced and now has a small wound with significant depth at the amputation site. 01/14/2016 -- she returns  after 2 weeks and had had a punch biopsy of abdominal wound done the last visit [name], [name] ([id number]) -- had a biopsy of her midline abdominal wound done and the Pathology diagnosis is that of ulceration, necrosis and inflammation and negative for dysplasia and malignancy. 01/21/16. 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 In the meantime she has new wounds of the left heel the right fourth toe amputation site is opened up. The back is stopped to the right heel area. 01/28/16; the abdominal wounds continued to improve. The extensive wound on her right Achilles also looks stable except for the lower aspect of the wound where there is a large liquefied area that probes right down to her calcaneus. This cultured Proteus last week I have her on Augmentin and doxycycline 1. I think this is going to need a course of IV antibiotics and I will call dialysis. X-ray I did last week was negative, I think she is going to need an MRI 02/04/16; right heel MRI as before Saturday. Receiving I believe IV Rocephin at dialysis 02/11/16; as it turns out the patient could not have a MRI as she has a bladder stimulator in place even though it is not currently in use since the beginning of this year. Although she has an allergy to IV contrast she apparently has done well with premedication so we will have to go for a CT scan with contrast. In the meantime she has had a fall now has a large skin tear on her left upper arm. She went to the ER and they suggested Tegaderm over topical antibiotics 03/03/16 currently patient returns after having been hospitalized for 2 weeks and subsequently transferred to [facility] nursing facility. She actually seems to be doing excellent compared to even when we last saw her according to our nursing staff. Both patient and her daughter are extremely encouraged at how well she is presenting at this point in time.  Overall the biggest issue is still the right Achilles area which is being managed at this point in time by Dr. Wyn Quaker. Patient is currently utilizing a wound VAC to that region. 03/17/16; patient is at [facility] nursing home still. Using Aquacel Ag to the wounds on her bilateral feet and still Prisma to the small open area on her abdomen. 03/31/16 at this point in time patient has been tolerating the dressing changes currently. She fortunately has no worsening of her symptoms although she tells me that the nurse who is caring for her at [facility] nursing facility decided that nothing was needed in regard to the lower abdominal wound from a dressing standpoint at this time. she is really not having significant discomfort or pain at this point she continues to have some tunneling in the proximal Achilles wound region. 04/07/16 patient continues to do well on evaluation today. Even the Achilles wound which has been the most tender is not giving her as much trouble. Unfortunately the PolyMem dressings that we utilize last week really did not seem to benefit her in particular. Obviously we will discontinue that at this point in time today. 04-15-16:Ms. Myricks is accompanied today by her daughter. She is still residing in an SNF undergoing dialysis, continues to receive antibiotics during dialysis as prescribed by Dr. Sampson Goon of infectious disease. She has a follow-up appointment with Dr. Sampson Goon on 04-17-16 to discuss the continuation of these antibiotics. Ms. Pelly denies any pain to any of the 3 remaining wounds she does admit to changing to [employer] surgical shoes for safety while ambulating with physical therapy. She denies any falls since her last appointment here although she and her daughter do admit to increased tremors of unclear etiology since her last appointment. She has tolerated the dressing changes that were prescribed last week. 04-22-16 Ms. Prentiss presents today accompanied by her  daughter  for evaluation of her diabetic foot ulcers. She is still residing in an SNF, she continues dialysis 3 times weekly. She'll follow up with Dr. Sampson Goon last Friday, and at that appointment IV antibiotics were discontinued. According to Ms. Ing and her daughter if there is any regression of her wounds Dr. Sampson Goon will consider re-starting the antibiotics. Ms. Kadar daughter states that since the discontinuation of antibiotic therapy her "twitches" have resolved. 04/29/16; I have not seen this patient in quite some time however she is completed triple antibiotic therapy given at dialysis for osteomyelitis as prescribed by infectious disease. She is still being followed by Dr. Wyn Quaker [name] ([id number]) of vascular surgery. We are using Hydrofera Blue to the surface of these wounds. She currently has 2 open areas a substantial area over her Achilles area although this is a lot better than the last time I saw this. She also has a small wound superiorly from this wound which has a superior probing depth of 2 cm. 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 My biopsy did not prove this nevertheless I think this is the correct clinical diagnosis. We are now following her for an area on the right Achilles part of her heel. This is not any different from last week. She also has a small tunneling area just above this. And unfortunately this week there is been a reopening of an area where her right fourth toe was previously amputated. She is completed antibiotics 05/13/16; she has a new reopening on the mid abdominal wound in the same site is previously. This is a small open area. Apligraf #1 today to the areas on the right heel o2 still an open area in the  fourth toe amputation site 05/27/16; the areas on her abdomen are completely closed over. Small open area from last time is closed. Apligraf #2 today to the areas on the right heel. The 4th toe amputation site is also heel 06/10/16; we did not have an Apligraf to put on today. This appeared tunneling wound on the right posterior calf appears to be closed. The more substantial area on her Achilles itself is improved. 06/24/16; I reapplied her third Apligraf today although we have not had one debridement last time. The original tunneling wound is not as closed as last time. The more substantial area on her Achilles itself is improved with advancing epithelialization 07/08/16 Apligraf #4 today. The original long area over her Achilles as a healthy-looking area at the superior aspect and a small divot inferiorly. Both of the wound surface is look healthy 07/22/16; patient arrived today with a 2 original wounds on the Achilles part of her ankle too small for another application of Apligraf. This is on the right Achilles area. The small divot inferiorly looks as though it may have skin over most of this and I can't really see an open area. She did have an erythematous area over the medial malleolus with some drainage this looks like a rapid injury not cellulitis 07/29/16; erythematous area over the medial malleolus seems a lot better this week. This was a wrap injury. The areas on the right Achilles is still a small divot. The area superiorly down somewhat in size. 08/05/16; condition is generally not feeling well. No major changed either wound. Using Prisma 08/19/16; patient  was last here she was admitted to hospital last week. Apparently she was complaining mostly of abdominal pain however a CT scan of the abdomen and pelvis revealed changes in the L2-L3 interspace suspicious for discitis and osteomyelitis as well as changes in the adjacent paraspinal and psoas muscles. She was seen by infectious disease Dr.  Sampson Goon. Her urine culture grew group B strep. An aspirate of the fluid at L2-L3 I reviewed today. Her AFB Gram stain was negative, fungal culture is still in place. C + S is negative so far as of today. She is on vancomycin and ceftazidine dosed at dialysis for 6 weeks. She is not complaining of fever or back pain currently. We have been using [employer] and to her wounds 08/26/16; patient is still receiving IV antibiotics at dialysis. She does not feel well complaining of a lot of pain in the right hip radiating down her leg. She rubs the lateral aspect of the right hip over the greater trochanter. Her wound measurements were a lot of different this week although I don't think they were accurately recorded last week the wound on the right Achilles area looks about the same. Superficial without any depth. 09/09/16; patient is still receiving IV antibiotics at dialysis chide believe are vancomycin and ceftazidine. Since she was last here she is developed a new wound on the tip of her right great toe. The right Achilles wound had significant surface slough/necrotic material on arrival today Also of note the patient seems to be developing widespread myoclonic jerking which is gotten a lot worse over the last week to 2 although our intake nurse and the patient's daughter both state that she had had this at certain times in the past I had not specifically noticed this. Apparently at one point this was due to phosphorus abnormalities which were corrected at dialysis. I have reviewed her medication list I don't see any usual offenders except she is on Paxil Neurontin and Ultram although she takes the Ultram sparingly [name], [name] ([id number]) maybe once a week 09/15/16; still on vancomycin and ceftazidine as I understand things at dialysis. The area that was on the tip of her right great toe identified last week looks like an ischemic wound. She is known diabetic PAD and has had previous  revascularization in 2017 by Dr. Wyn Quaker. It is possible she may need to see him again. I will probably attempt debridement of this next week we are using topical Santyl. In the meantime her heel wound has closed superiorly she is only left with a small wound at the inferior part of the Achilles area which was her deeper part of this wound at one point however even this area looks better 09/23/16; the patient has been to see Dr. Sampson Goon and I gather because of the myoclonic jerking he is changed one of her antibiotics probably the ceftazidine. Concerning today is the tip of her right great toe which seems to be an expanding necrotic area. She has known severe PAD and this looks like an ischemic wound to me. She has previously been revascularized by Dr.Dew in nearly part of 2017. We continue to make reasonable progression with the heel wound using Hydrofera Blue. We are using Prisma to the great toe on the right. 09/30/16; the patient looks a lot better today apparently her Rocephin was stopped also perhaps the vancomycin. She states she didn't get any antibiotics at dialysis. We have been using Hydrofera Blue to the heel area and Santyl to the  right great toe. She has a follow-up with Dr. dew on 5/10with regards to the right toe wound which looks ischemic Objective Constitutional Sitting or standing Blood Pressure is within target range for patient.. Pulse regular and within target range for patient.Marland Kitchen Respirations regular, non-labored and within target range.. Temperature is normal and within the target range for the patient.. Patient's appearance is neat and clean. Appears in no acute distress. Well nourished and well developed.. Vitals Time Taken: 8:12 AM, Height: 65 in, Weight: 160 lbs, BMI: 26.6, Pulse: 68 bpm, Respiratory Rate: 16 breaths/min, Blood Pressure: 108/42 mmHg. General Notes: Wound exam; I removed a copious amount of necrotic eschar and underlying nonviable tissue from the Achilles  area using a #5 curet and then pickups and a scalpel. After this debridement there is only a small superficial area remaining. The area over the tip aright great toe again has a black surface over this. This looks ischemic I did not attempt any further mechanical debridement of this but I think continuing Santyl as the treatment of choice until we can get a vascular review Integumentary (Hair, Skin) Wound #20 status is Open. Original cause of wound was Gradually Appeared. The wound is located on the Right Toe Great. The wound measures 1.7cm length x 1.1cm width x 0.1cm depth; 1.469cm^2 area and 0.147cm^3 volume. There is no tunneling or undermining noted. There is a large amount of serous drainage noted. The wound margin is distinct with the outline attached to the wound base. There is no granulation within the wound bed. There is a large (67-100%) amount of necrotic tissue within the wound bed including Adherent Slough. Periwound temperature was noted as No Abnormality. The periwound has tenderness on palpation. [name], [name] ([id number]) Wound #9 status is Open. Original cause of wound was Gradually Appeared. The wound is located on the Right Achilles. The wound measures 1cm length x 0.9cm width x 0.1cm depth; 0.707cm^2 area and 0.071cm^3 volume. There is no tunneling or undermining noted. There is a large amount of serous drainage noted. The wound margin is distinct with the outline attached to the wound base. There is no granulation within the wound bed. There is a large (67-100%) amount of necrotic tissue within the wound bed including Eschar and Adherent Slough. Periwound temperature was noted as No Abnormality. The periwound has tenderness on palpation. Assessment Active Problems ICD-10 E11.621 - Type 2 diabetes mellitus with foot ulcer L97.512 - Non-pressure chronic ulcer of other part of right foot with fat layer exposed E11.51 - Type 2 diabetes mellitus with diabetic peripheral  angiopathy without gangrene Procedures Wound #9 Wound #9 is a Diabetic Wound/Ulcer of the Lower Extremity located on the Right Achilles . There was a Skin/Subcutaneous Tissue Debridement ([id number]) debridement with total area of 0.9 sq cm performed by Maxwell Caul, MD. with the following instrument(s): Blade, Curette, and Forceps to remove Viable and Non-Viable tissue/material including Exudate, Fibrin/Slough, and Subcutaneous after achieving pain control using Lidocaine 4% Topical Solution. A time out was conducted at 08:31, prior to the start of the procedure. A Minimum amount of bleeding was controlled with Pressure. The procedure was tolerated well with a pain level of 0 throughout and a pain level of 0 following the procedure. Post Debridement Measurements: 1.2cm length x 0.3cm width x 0.1cm depth; 0.028cm^3 volume. Character of Wound/Ulcer Post Debridement requires further debridement. Severity of Tissue Post Debridement is: Fat layer exposed. Post procedure Diagnosis Wound #9: Same as Pre-Procedure Plan Wound Cleansing: [name], [name] ([id number]) Wound #  20 Right Toe Great: Clean wound with Normal Saline. Cleanse wound with mild soap and water May shower with protection. Wound #9 Right Achilles: Clean wound with Normal Saline. Cleanse wound with mild soap and water May shower with protection. Anesthetic: Wound #20 Right Toe Great: Topical Lidocaine 4% cream applied to wound bed prior to debridement - all wounds for clinic use only Wound #9 Right Achilles: Topical Lidocaine 4% cream applied to wound bed prior to debridement - all wounds for clinic use only Primary Wound Dressing: Wound #20 Right Toe Great: Santyl Ointment Wound #9 Right Achilles: Hydrafera Blue Secondary Dressing: Wound #20 Right Toe Great: Dry Gauze Kerlix and Coban Wound #9 Right Achilles: ABD pad Conform/Kerlix Dressing Change Frequency: Wound #20 Right Toe Great: Change dressing every  other day. Wound #9 Right Achilles: Change dressing every other day. Follow-up Appointments: Wound #20 Right Toe Great: Return Appointment in 1 week. Wound #9 Right Achilles: Return Appointment in 1 week. Edema Control: Wound #9 Right Achilles: Kerlix and Coban - Right Lower Extremity - do not wrap too tight and not the whole leg Elevate legs to the level of the heart and pump ankles as often as possible Off-Loading: Wound #20 Right Toe Great: Other: - Float heels Wound #9 Right Achilles: Other: - Float heels [name], SEVERS ([id number]) #1 I'm continuing Hydrofera Blue to the right Achilles area which looks as though it may be healed by next week #2 the tip of the right great toe I find concerning for ongoing ischemia in this area I continue Santyl but am not planning to do any more mechanical debridement until this is reviewed by vascular doctor Dew in 8 days' time #3 Coreg with patient is pain she is off antibiotics Electronic Signature(s) Signed: 09/30/2016 5:52:53 PM By: Baltazar Najjar MD Entered By: Baltazar Najjar on 09/30/2016 09:05:26 [name] ([id number]) -------------------------------------------------------------------------------- SuperBill Details Patient Name: [name] Date of Service: 09/30/2016 Medical Record Patient Account Number: [id number] Number: Treating RN: Phillis Haggis 09/24/1933 (82 y.o. Other Clinician: Date of Birth/Sex: Female) Treating Suhana Wilner Primary Care Provider: [name] Provider/Extender: G Referring Provider: Charolotte Capuchin in Treatment: 41 Diagnosis Coding ICD-10 Codes Code Description E11.621 Type 2 diabetes mellitus with foot ulcer L97.512 Non-pressure chronic ulcer of other part of right foot with fat layer exposed E11.51 Type 2 diabetes mellitus with diabetic peripheral angiopathy without gangrene Facility Procedures CPT4 Code Description: [id number] 11042 - DEB SUBQ TISSUE 20 SQ  CM/< ICD-10 Description Diagnosis E11.621 Type 2 diabetes mellitus with foot ulcer L97.512 Non-pressure chronic ulcer of other part of right fo Modifier: ot with fat la Quantity: 1 yer exposed Physician Procedures CPT4 Code Description: 0347425 11042 - WC PHYS SUBQ TISS 20 SQ CM ICD-10 Description Diagnosis E11.621 Type 2 diabetes mellitus with foot ulcer L97.512 Non-pressure chronic ulcer of other part of right fo Modifier: ot with fat lay Quantity: 1 er exposed Electronic Signature(s) Signed: 09/30/2016 5:52:53 PM By: Baltazar Najjar MD Entered By: Baltazar Najjar on 09/30/2016 09:05:44

## 2016-10-02 NOTE — Progress Notes (Signed)
[name], [name] ([id number]) Visit Report for 09/30/2016 Arrival Information Details Patient Name: [name], [name]. Date of Service: 09/30/2016 8:00 AM Medical Record Patient Account Number: [id number] Number: Treating RN: Phillis Haggis 1934/01/16 (82 [name] Other Clinician: Date of Birth/Sex: Female) Treating ROBSON, MICHAEL Primary Care Aayat Hajjar: [name] Lean Fayson/Extender: G Referring Gioia Ranes: Charolotte Capuchin in Treatment: 87 Visit Information History Since Last Visit All ordered tests and consults were completed: No Patient Arrived: Wheel Chair Added or deleted any medications: No Arrival Time: 08:10 Any new allergies or adverse reactions: No Accompanied By: daughter Had a fall or experienced change in No Transfer Assistance: EasyPivot Patient activities of daily living that may affect Lift risk of falls: Patient Identification Verified: Yes Signs or symptoms of abuse/neglect since last No Secondary Verification Process Yes visito Completed: Hospitalized since last visit: No Patient Requires Transmission- No Has Dressing in Place as Prescribed: Yes Based Precautions: Pain Present Now: No Patient Has Alerts: Yes Patient Alerts: Patient on Blood Thinner DMII Warfarin ABI [place] Bilateral NO BP RIGHT ARM Electronic Signature(s) Signed: 09/30/2016 4:37:12 PM By: Alejandro Mulling Entered By: Alejandro Mulling on 09/30/2016 08:18:21 [name] ([id number]) -------------------------------------------------------------------------------- Encounter Discharge Information Details Patient Name: [name]. Date of Service: 09/30/2016 8:00 AM Medical Record Patient Account Number: [id number] Number: Treating RN: Phillis Haggis 05-29-1934 (82 [name] Other Clinician: Date of Birth/Sex: Female) Treating ROBSON, MICHAEL Primary Care Celso Granja: [name] Orlander [place]/Extender: G Referring Trevante Tennell: Charolotte Capuchin in Treatment:  65 Encounter Discharge Information Items Discharge Pain Level: 0 Discharge Condition: Stable Ambulatory Status: Wheelchair Discharge Destination: Nursing Home Transportation: Private Auto Accompanied By: daughter Schedule Follow-up Appointment: Yes Medication Reconciliation completed No and provided to Patient/Care Zita Ozimek: Provided on Clinical Summary of Care: 09/30/2016 Form Type Recipient Paper Patient [facility] Electronic Signature(s) Signed: 09/30/2016 4:37:12 PM By: Alejandro Mulling Previous Signature: 09/30/2016 8:50:11 AM Version By: Gwenlyn Perking Entered By: Alejandro Mulling on 09/30/2016 09:24:24 [name] ([id number]) -------------------------------------------------------------------------------- Lower Extremity Assessment Details Patient Name: [name] Date of Service: 09/30/2016 8:00 AM Medical Record Patient Account Number: [id number] Number: Treating RN: Phillis Haggis 1934/05/14 (82 [name] Other Clinician: Date of Birth/Sex: Female) Treating ROBSON, MICHAEL Primary Care Breiana Stratmann: [name] Levie Wages/Extender: G Referring Rai Severns: Charolotte Capuchin in Treatment: 86 Vascular Assessment Pulses: Dorsalis Pedis Palpable: [Right:Yes] Posterior Tibial Extremity colors, hair growth, and conditions: Extremity Color: [Right:Hyperpigmented] Temperature of Extremity: [Right:Warm] Capillary Refill: [Right:< 3 seconds] Toe Nail Assessment Left: Right: Thick: Yes Discolored: No Deformed: Yes Improper Length and Hygiene: No Electronic Signature(s) Signed: 09/30/2016 4:37:12 PM By: Alejandro Mulling Entered By: Alejandro Mulling on 09/30/2016 08:30:25 [name] ([id number]) -------------------------------------------------------------------------------- Multi Wound Chart Details Patient Name: [name] Date of Service: 09/30/2016 8:00 AM Medical Record Patient Account Number: [id number] Number: Treating RN: Phillis Haggis July 05, 1933 (82 [name] Other Clinician: Date of Birth/Sex: Female) Treating ROBSON, MICHAEL Primary Care Jolita Haefner: [name] Maudy Yonan/Extender: G Referring Edison Wollschlager: Charolotte Capuchin in Treatment: 41 Vital Signs Height(in): 65 Pulse(bpm): 68 Weight(lbs): 160 Blood Pressure 108/42 (mmHg): Body Mass Index(BMI): 27 Temperature(F): Respiratory Rate 16 (breaths/min): Photos: [20:No Photos] [9:No Photos] [N/A:N/A] Wound Location: [20:Right Toe Great] [9:Right Achilles] [N/A:N/A] Wounding Event: [20:Gradually Appeared] [9:Gradually Appeared] [N/A:N/A] Primary Etiology: [20:Atypical] [9:Diabetic Wound/Ulcer of N/A the Lower Extremity] Secondary Etiology: [20:N/A] [9:Arterial Insufficiency Ulcer N/A] Comorbid History: [20:Arrhythmia, Congestive Heart Failure, Hypertension, Type II Diabetes] [9:Arrhythmia, Congestive N/A Heart Failure, Hypertension, Type II Diabetes] Date Acquired: [20:09/09/2016] [9:06/02/2015] [N/A:N/A] Weeks of Treatment: [20:3] [9:41] [N/A:N/A] Wound Status: [  20:Open] [9:Open] [N/A:N/A] Pending Amputation on No [9:Yes] [N/A:N/A] Presentation: Measurements L x W x D 1.7x1.1x0.1 [9:1x0.9x0.1] [N/A:N/A] (cm) Area (cm) : [20:1.469] [9:0.707] [N/A:N/A] Volume (cm) : [20:0.147] [9:0.071] [N/A:N/A] % Reduction in Area: [20:-160.00%] [9:97.80%] [N/A:N/A] % Reduction in Volume: -157.90% [9:99.30%] [N/A:N/A] Classification: [20:Partial Thickness] [9:Grade 2] [N/A:N/A] HBO Classification: [20:Grade 1] [9:N/A] [N/A:N/A] Exudate Amount: [20:Large] [9:Large] [N/A:N/A] Exudate Type: [20:Serous] [9:Serous] [N/A:N/A] Exudate Color: [20:amber] [9:amber] [N/A:N/A] Wound Margin: [20:Distinct, outline attached] [9:Distinct, outline attached] [N/A:N/A] Granulation Amount: [20:None Present (0%)] [9:None Present (0%)] [N/A:N/A] Necrotic Amount: Large (67-100%) Large (67-100%) N/A Necrotic Tissue: Adherent Slough Eschar, Adherent Slough N/A Epithelialization: None Medium  (34-66%) N/A Debridement: N/A Debridement (16109- N/A 11047) Pre-procedure N/A 08:31 N/A Verification/Time Out Taken: Pain Control: N/A Lidocaine 4% Topical N/A Solution Tissue Debrided: N/A Fibrin/Slough, Exudates, N/A Subcutaneous Level: N/A Skin/Subcutaneous N/A Tissue Debridement Area (sq N/A 0.9 N/A cm): Instrument: N/A Blade, Curette, Forceps N/A Bleeding: N/A Minimum N/A Hemostasis Achieved: N/A Pressure N/A Procedural Pain: N/A 0 N/A Post Procedural Pain: N/A 0 N/A Debridement Treatment N/A Procedure was tolerated N/A Response: well Post Debridement N/A 1.2x0.3x0.1 N/A Measurements L x W x D (cm) Post Debridement N/A 0.028 N/A Volume: (cm) Periwound Skin Texture: No Abnormalities Noted No Abnormalities Noted N/A Periwound Skin No Abnormalities Noted No Abnormalities Noted N/A Moisture: Periwound Skin Color: No Abnormalities Noted No Abnormalities Noted N/A Temperature: No Abnormality No Abnormality N/A Tenderness on Yes Yes N/A Palpation: Wound Preparation: Ulcer Cleansing: Ulcer Cleansing: N/A Rinsed/Irrigated with Rinsed/Irrigated with Saline Saline Topical Anesthetic Topical Anesthetic Applied: Other: lidocaine Applied: Other: lidocaine 4% 4% Procedures Performed: N/A Debridement N/A Treatment Notes Electronic Signature(s) Signed: 09/30/2016 5:52:53 PM By: Baltazar Najjar MD [name] ([id number]) Entered By: Baltazar Najjar on 09/30/2016 09:01:37 [name] ([id number]) -------------------------------------------------------------------------------- Multi-Disciplinary Care Plan Details Patient Name: [name], [name]. Date of Service: 09/30/2016 8:00 AM Medical Record Patient Account Number: [id number] Number: Treating RN: Phillis Haggis Oct 19, 1933 (82 [name] Other Clinician: Date of Birth/Sex: Female) Treating ROBSON, MICHAEL Primary Care Elise Knobloch: [name] Briya Lookabaugh/Extender: G Referring Quentin Shorey: Charolotte Capuchin  in Treatment: 17 Active Inactive ` Abuse / Safety / Falls / Self Care Management Nursing Diagnoses: Impaired physical mobility Potential for falls Goals: Patient will remain injury free Date Initiated: 12/17/2015 Target Resolution Date: 08/29/2016 Goal Status: Active Interventions: Assess fall risk on admission and as needed Notes: ` Necrotic Tissue Nursing Diagnoses: Impaired tissue integrity related to necrotic/devitalized tissue Knowledge deficit related to management of necrotic/devitalized tissue Goals: Necrotic/devitalized tissue will be minimized in the wound bed Date Initiated: 02/04/2016 Target Resolution Date: 08/29/2016 Goal Status: Active Interventions: Assess patient pain level pre-, during and post procedure and prior to discharge Provide education on necrotic tissue and debridement process Treatment Activities: Apply topical anesthetic as ordered : 02/04/2016 [name], [name] ([id number]) Notes: ` Wound/Skin Impairment Nursing Diagnoses: Impaired tissue integrity Goals: Patient/caregiver will verbalize understanding of skin care regimen Date Initiated: 12/17/2015 Target Resolution Date: 08/29/2016 Goal Status: Active Ulcer/skin breakdown will have a volume reduction of 30% by week 4 Date Initiated: 12/17/2015 Target Resolution Date: 08/29/2016 Goal Status: Active Ulcer/skin breakdown will have a volume reduction of 50% by week 8 Date Initiated: 12/17/2015 Target Resolution Date: 08/29/2016 Goal Status: Active Ulcer/skin breakdown will have a volume reduction of 80% by week 12 Date Initiated: 12/17/2015 Target Resolution Date: 08/29/2016 Goal Status: Active Ulcer/skin breakdown will heal within 14 weeks Date Initiated: 12/17/2015 Target Resolution Date: 08/29/2016 Goal Status: Active Interventions: Assess patient/caregiver ability to obtain necessary  supplies Assess patient/caregiver ability to perform ulcer/skin care regimen upon admission and as  needed Assess ulceration(s) every visit Notes: Electronic Signature(s) Signed: 09/30/2016 4:37:12 PM By: Alejandro Mulling Entered By: Alejandro Mulling on 09/30/2016 08:30:32 [name] ([id number]) -------------------------------------------------------------------------------- Pain Assessment Details Patient Name: [name]. Date of Service: 09/30/2016 8:00 AM Medical Record Patient Account Number: [id number] Number: Treating RN: Phillis Haggis 01-Jun-1934 (82 [name] Other Clinician: Date of Birth/Sex: Female) Treating ROBSON, MICHAEL Primary Care Sonyia Muro: [name] Kaiyla Stahly/Extender: G Referring Azelie Noguera: Charolotte Capuchin in Treatment: 27 Active Problems Location of Pain Severity and Description of Pain Patient Has Paino No Site Locations With Dressing Change: No Pain Management and Medication Current Pain Management: Electronic Signature(s) Signed: 09/30/2016 4:37:12 PM By: Alejandro Mulling Entered By: Alejandro Mulling on 09/30/2016 08:18:28 [name] ([id number]) -------------------------------------------------------------------------------- Patient/Caregiver Education Details Patient Name: [name]. Date of Service: 09/30/2016 8:00 AM Medical Record Patient Account Number: [id number] Number: Treating RN: Phillis Haggis [birth year] (82 [name] Other Clinician: Date of Birth/Gender: Female) Treating ROBSON, MICHAEL Primary Care Physician: [name] Physician/Extender: G Referring Physician: Charolotte Capuchin in Treatment: 36 Education Assessment Education Provided To: Patient Education Topics Provided Wound/Skin Impairment: Handouts: Other: change dressing as ordered Methods: Demonstration, Explain/Verbal Responses: State content correctly Electronic Signature(s) Signed: 09/30/2016 4:37:12 PM By: Alejandro Mulling Entered By: Alejandro Mulling on 09/30/2016 09:24:39 [name]  ([id number]) -------------------------------------------------------------------------------- Wound Assessment Details Patient Name: [name] Date of Service: 09/30/2016 8:00 AM Medical Record Patient Account Number: [id number] Number: Treating RN: Phillis Haggis 28-Feb-1934 (82 [name] Other Clinician: Date of Birth/Sex: Female) Treating ROBSON, MICHAEL Primary Care Mabel Roll: [name] Clydean Posas/Extender: G Referring Mario Voong: Charolotte Capuchin in Treatment: 41 Wound Status Wound Number: 20 Primary Atypical Etiology: Wound Location: Right Toe Great Wound Open Wounding Event: Gradually Appeared Status: Date Acquired: 09/09/2016 Comorbid Arrhythmia, Congestive Heart Failure, Weeks Of Treatment: 3 History: Hypertension, Type II Diabetes Clustered Wound: No Photos Photo Uploaded By: Alejandro Mulling on 09/30/2016 16:19:07 Wound Measurements Length: (cm) 1.7 Width: (cm) 1.1 Depth: (cm) 0.1 Area: (cm) 1.469 Volume: (cm) 0.147 % Reduction in Area: -160% % Reduction in Volume: -157.9% Epithelialization: None Tunneling: No Undermining: No Wound Description Classification: Partial Thickness Diabetic Severity (Wagner): Grade 1 Wound Margin: Distinct, outline attach Exudate Amount: Large Exudate Type: Serous Exudate Color: amber Foul Odor After Cleansing: No Slough/Fibrino No ed Wound Bed Granulation Amount: None Present (0%) Necrotic Amount: Large (67-100%) [name], [name] ([id number]) Necrotic Quality: Adherent Slough Periwound Skin Texture Texture Color No Abnormalities Noted: No No Abnormalities Noted: No Moisture Temperature / Pain No Abnormalities Noted: No Temperature: No Abnormality Tenderness on Palpation: Yes Wound Preparation Ulcer Cleansing: Rinsed/Irrigated with Saline Topical Anesthetic Applied: Other: lidocaine 4%, Treatment Notes Wound #20 (Right Toe Great) 1. Cleansed with: Clean wound with Normal Saline 2.  Anesthetic Topical Lidocaine 4% cream to wound bed prior to debridement 4. Dressing Applied: Santyl Ointment 5. Secondary Dressing Applied Dry Gauze Kerlix/Conform 7. Secured with Secretary/administrator) Signed: 09/30/2016 4:37:12 PM By: Alejandro Mulling Entered By: Alejandro Mulling on 09/30/2016 08:28:56 [name] ([id number]) -------------------------------------------------------------------------------- Wound Assessment Details Patient Name: [name] Date of Service: 09/30/2016 8:00 AM Medical Record Patient Account Number: [id number] Number: Treating RN: Phillis Haggis 16-Jun-1933 (82 [name] Other Clinician: Date of Birth/Sex: Female) Treating ROBSON, MICHAEL Primary Care Addiel Mccardle: [name] Yuri Flener/Extender: G Referring Pietra Zuluaga: Charolotte Capuchin in Treatment: 41 Wound Status Wound Number: 9 Primary Diabetic Wound/Ulcer of the Lower Etiology: Extremity Wound  Location: Right Achilles Secondary Arterial Insufficiency Ulcer Wounding Event: Gradually Appeared Etiology: Date Acquired: 06/02/2015 Wound Open Weeks Of Treatment: 41 Status: Clustered Wound: No Comorbid Arrhythmia, Congestive Heart Pending Amputation On Presentation History: Failure, Hypertension, Type II Diabetes Photos Photo Uploaded By: Alejandro MullingPinkerton, Debra on 09/30/2016 16:19:34 Wound Measurements Length: (cm) 1 Width: (cm) 0.9 Depth: (cm) 0.1 Area: (cm) 0.707 Volume: (cm) 0.071 % Reduction in Area: 97.8% % Reduction in Volume: 99.3% Epithelialization: Medium (34-66%) Tunneling: No Undermining: No Wound Description Classification: Grade 2 Foul Odor After Wound Margin: Distinct, outline attached Slough/Fibrino Exudate Amount: Large Exudate Type: Serous Exudate Color: amber Cleansing: No Yes Wound Bed [name], Natalie Y. ([id number]) Granulation Amount: None Present (0%) Necrotic Amount: Large (67-100%) Necrotic Quality: Eschar, Adherent Slough Periwound  Skin Texture Texture Color No Abnormalities Noted: No No Abnormalities Noted: No Moisture Temperature / Pain No Abnormalities Noted: No Temperature: No Abnormality Tenderness on Palpation: Yes Wound Preparation Ulcer Cleansing: Rinsed/Irrigated with Saline Topical Anesthetic Applied: Other: lidocaine 4%, Treatment Notes Wound #9 (Right Achilles) 1. Cleansed with: Clean wound with Normal Saline 2. Anesthetic Topical Lidocaine 4% cream to wound bed prior to debridement 4. Dressing Applied: Hydrafera Blue 5. Secondary Dressing Applied ABD Pad Kerlix/Conform 7. Secured with Tape Notes kerlix, coban; gauze and conform Electronic Signature(s) Signed: 09/30/2016 4:37:12 PM By: Alejandro MullingPinkerton, Debra Entered By: Alejandro MullingPinkerton, Debra on 09/30/2016 40:98:1108:29:24 [name], Natalie Y. ([id number]) -------------------------------------------------------------------------------- Vitals Details Patient Name: [name], [name] Y. Date of Service: 09/30/2016 8:00 AM Medical Record Patient Account Number: [id number] Number: Treating RN: Phillis Haggisinkerton, Debi 11/21/1933 (82 [name] Other Clinician: Date of Birth/Sex: Female) Treating ROBSON, MICHAEL Primary Care Traye Bates: Rolm GalaGrandis, Heidi Kysha Muralles/Extender: G Referring Yolanda Huffstetler: Charolotte CapuchinGrandis, Heidi Weeks in Treatment: 6041 Vital Signs Time Taken: 08:12 Pulse (bpm): 68 Height (in): 65 Respiratory Rate (breaths/min): 16 Weight (lbs): 160 Blood Pressure (mmHg): 108/42 Body Mass Index (BMI): 26.6 Reference Range: 80 - 120 mg / dl Electronic Signature(s) Signed: 09/30/2016 4:37:12 PM By: Alejandro MullingPinkerton, Debra Entered By: Alejandro MullingPinkerton, Debra on 09/30/2016 08:20:14

## 2016-10-07 ENCOUNTER — Encounter: Payer: Medicare Other | Admitting: Internal Medicine

## 2016-10-07 DIAGNOSIS — E11621 Type 2 diabetes mellitus with foot ulcer: Secondary | ICD-10-CM | POA: Diagnosis not present

## 2016-10-08 ENCOUNTER — Encounter (INDEPENDENT_AMBULATORY_CARE_PROVIDER_SITE_OTHER): Payer: Self-pay | Admitting: Vascular Surgery

## 2016-10-08 ENCOUNTER — Ambulatory Visit (INDEPENDENT_AMBULATORY_CARE_PROVIDER_SITE_OTHER): Payer: Medicare Other | Admitting: Vascular Surgery

## 2016-10-08 ENCOUNTER — Encounter (INDEPENDENT_AMBULATORY_CARE_PROVIDER_SITE_OTHER): Payer: Self-pay

## 2016-10-08 ENCOUNTER — Ambulatory Visit (INDEPENDENT_AMBULATORY_CARE_PROVIDER_SITE_OTHER): Payer: Medicare Other

## 2016-10-08 VITALS — BP 116/70 | HR 82 | Resp 16 | Wt 145.0 lb

## 2016-10-08 DIAGNOSIS — I7025 Atherosclerosis of native arteries of other extremities with ulceration: Secondary | ICD-10-CM

## 2016-10-08 DIAGNOSIS — N186 End stage renal disease: Secondary | ICD-10-CM

## 2016-10-08 DIAGNOSIS — Z992 Dependence on renal dialysis: Secondary | ICD-10-CM

## 2016-10-09 IMAGING — CT CT HEAD W/O CM
3 series · 15 of 47 positions shown, 18 images · non-contrast
Comparison: Head CT dated 06/22/2015

CLINICAL DATA: 82-year-old female with fall.  Patient on Coumadin.

EXAM:
CT HEAD WITHOUT CONTRAST
TECHNIQUE: Contiguous axial images were obtained from the base of the skull
through the vertex without intravenous contrast.

[Series 2: head wo · axial · 0.41mm/px · z∈[-76,+49]mm · 9 of 30 slices shown, 12 images]
[im 3/30  brain]
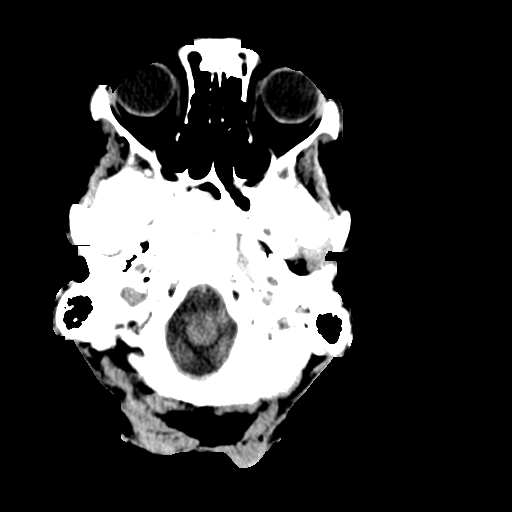
[im 3/30  bone]
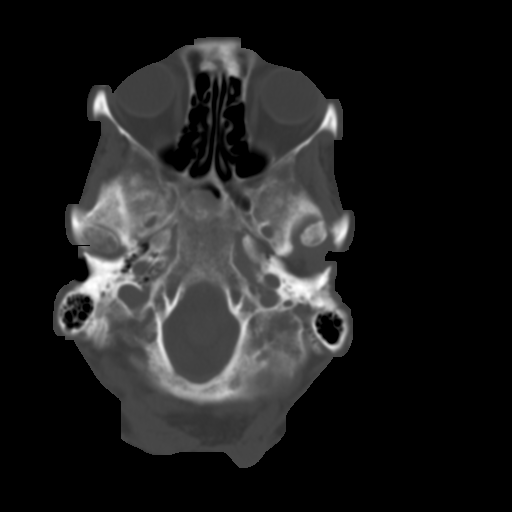
[im 6/30  brain]
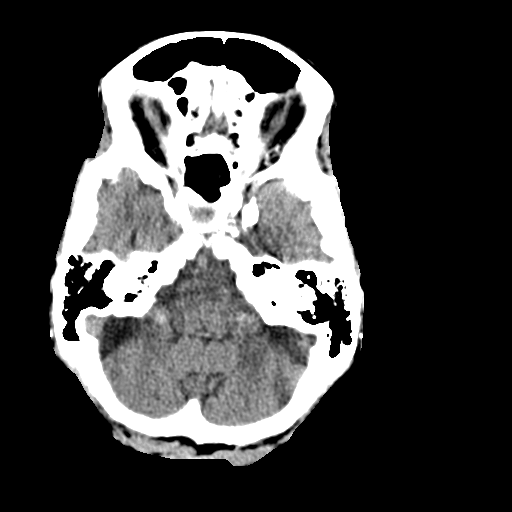
[im 9/30  brain]
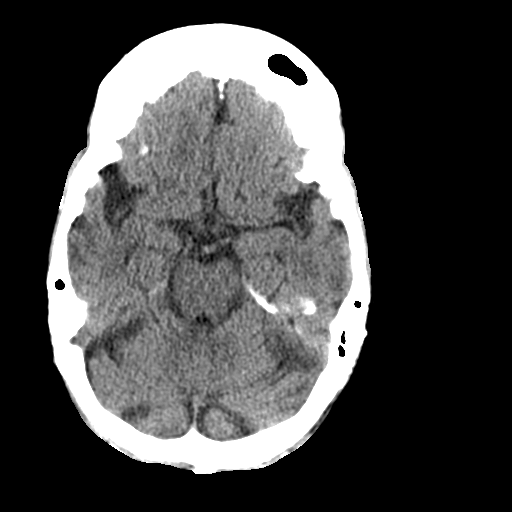
[im 12/30  brain]
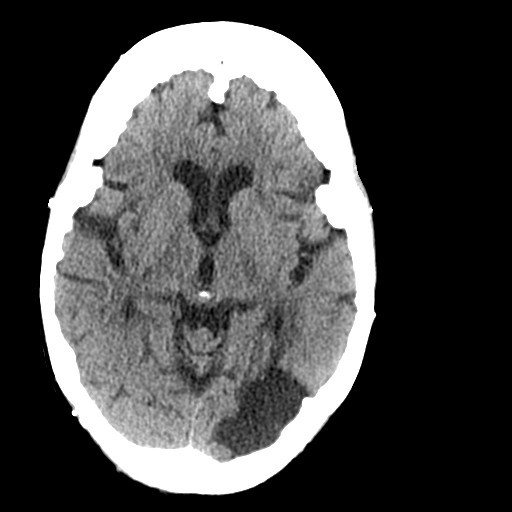
[im 16/30  brain]
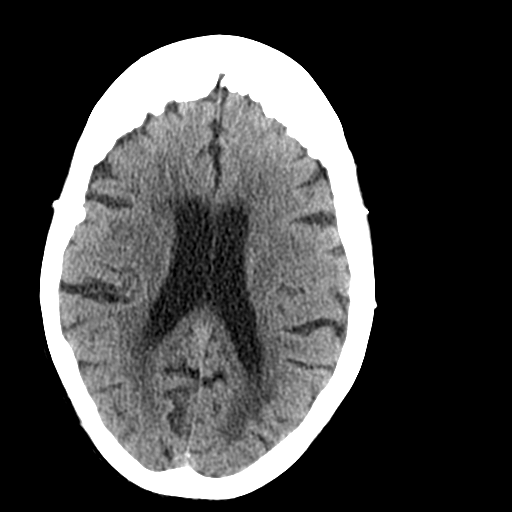
[im 16/30  bone]
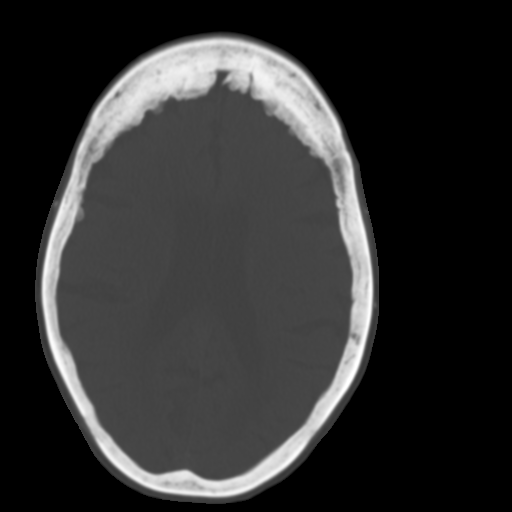
[im 19/30  brain]
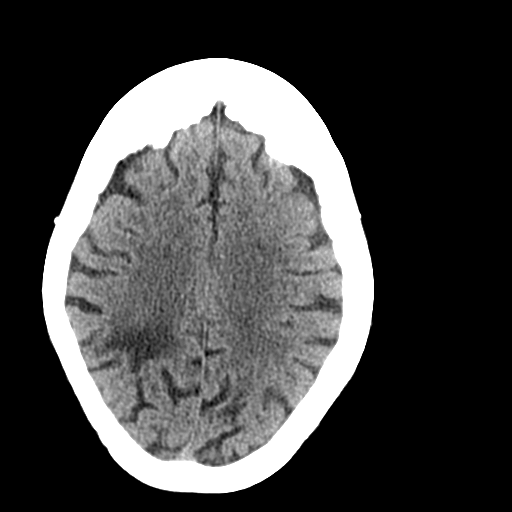
[im 22/30  brain]
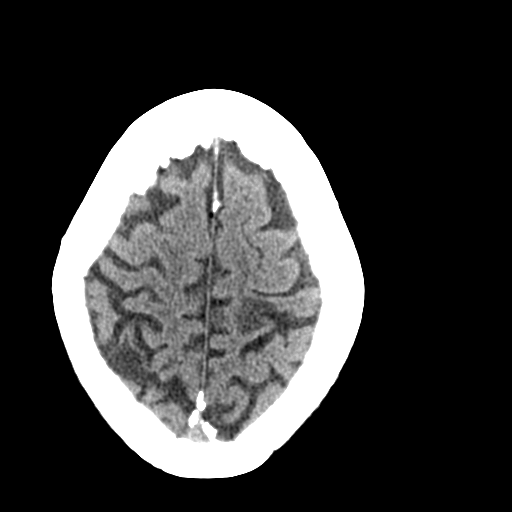
[im 25/30  brain]
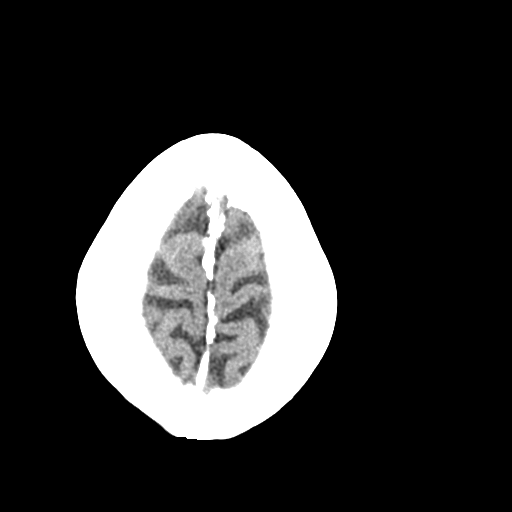
[im 28/30  brain]
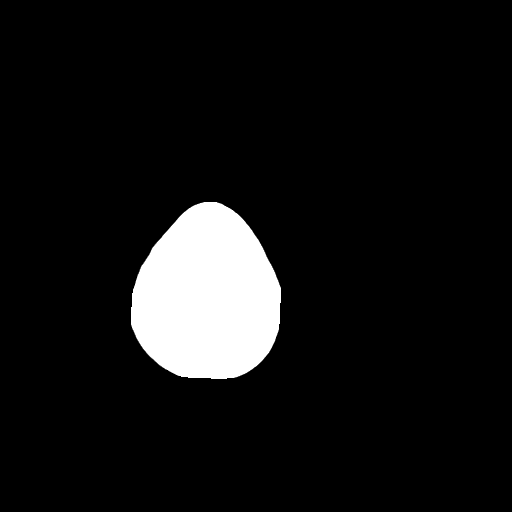
[im 28/30  bone]
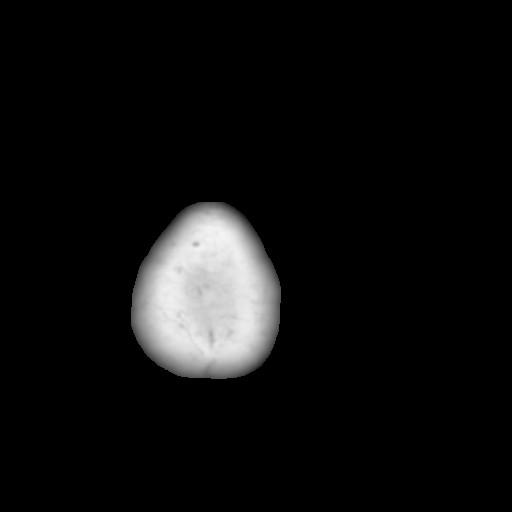

[Series 4: coronal soft tissue · coronal · 0.29mm/px · 3 of 67 slices shown]
[im 23/67  brain]
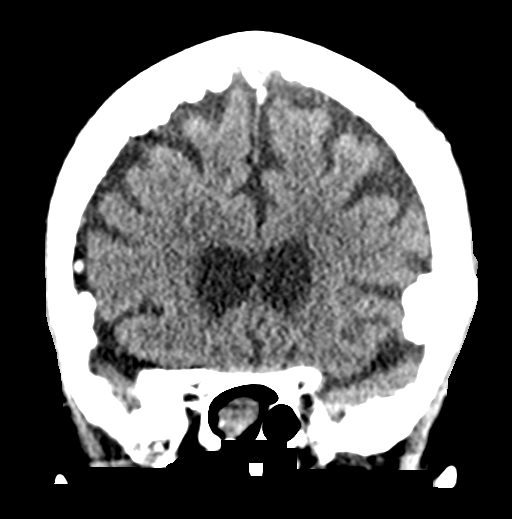
[im 30/67  brain]
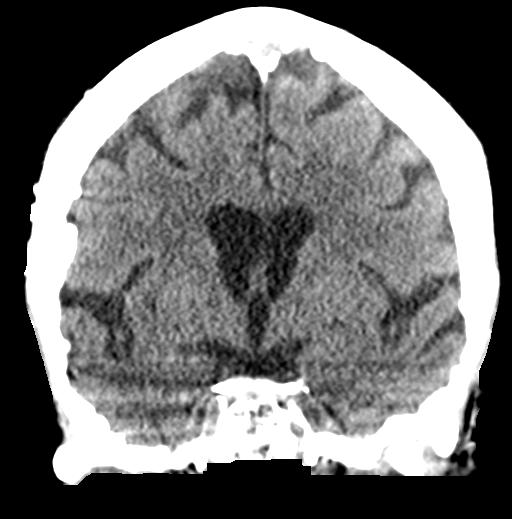
[im 37/67  brain]
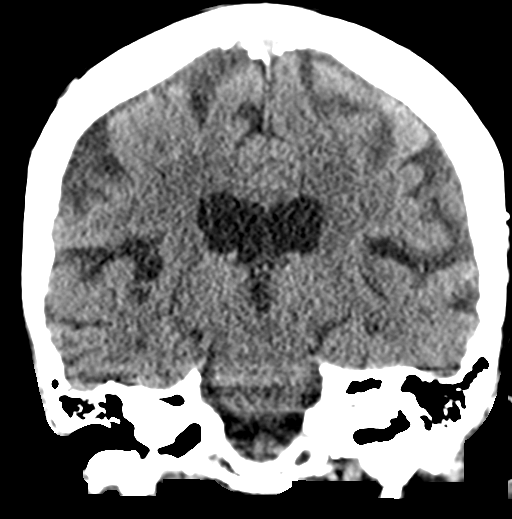

[Series 5: sagittal soft tissue · sagittal · 0.29mm/px · 3 of 48 slices shown]
[im 16/48  brain]
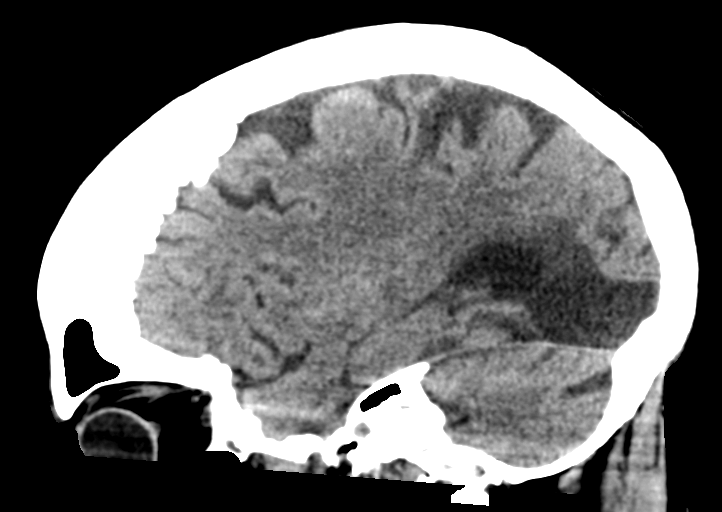
[im 24/48  brain]
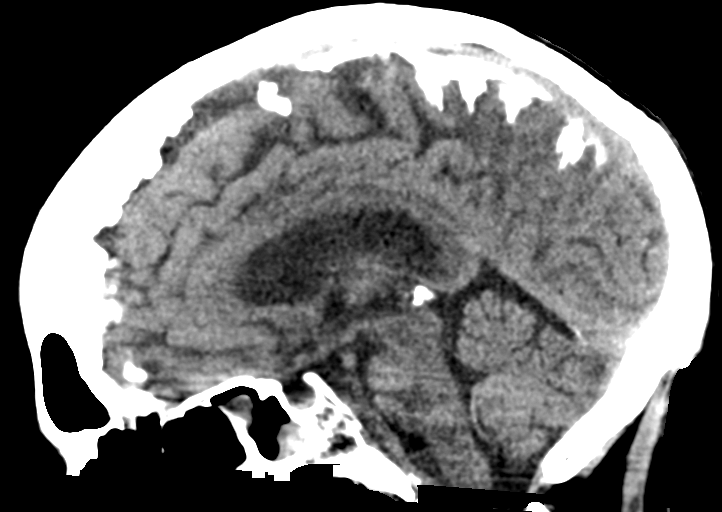
[im 32/48  brain]
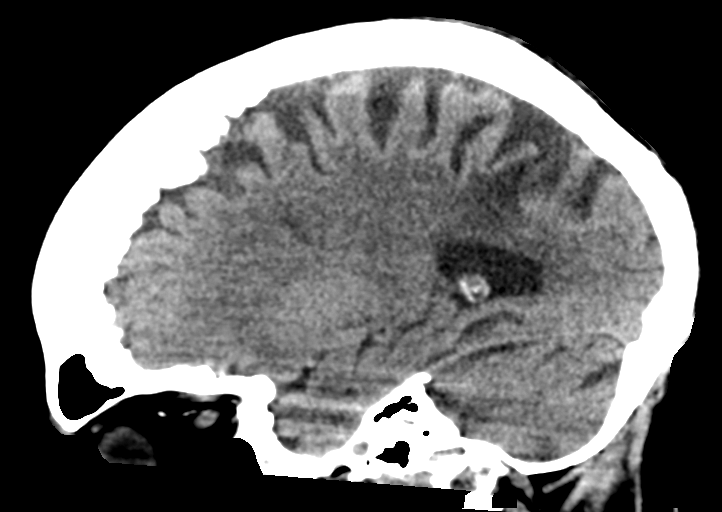

[15 of 47 positions shown; findings below may reference images not displayed]

FINDINGS: There is mild age-related atrophy chronic microvascular ischemic
changes. Right parietal convexity, left occipital, and left parietal
convexity old infarct and encephalomalacia noted. There is no acute
intracranial hemorrhage. No mass effect or midline shift noted. No
extra-axial fluid collections.

Right sphenoid sinus retention cyst or polyp. The remainder of the
visualized paranasal sinuses and mastoid air cells are clear. The
calvarium is intact. Bifrontal hyperostosis frontalis.
IMPRESSION: No acute intracranial hemorrhage.

Age-related atrophy and chronic microvascular ischemic changes.
Multiple bilateral old infarcts.

## 2016-10-09 NOTE — Progress Notes (Signed)
MARLIE, KUENNEN ([id number]) Visit Report for 10/07/2016 Chief Complaint Document Details Patient Name: [name], [name]. Date of Service: 10/07/2016 8:00 AM Medical Record Patient Account Number: [id number] Number: Treating RN: Phillis Haggis 1933/12/30 (82 [name] Other Clinician: Date of Birth/Sex: Female) Treating Alaia Lordi Primary Care Provider: [name] Provider/Extender: G Referring Provider: Charolotte Capuchin in Treatment: 35 Information Obtained from: Patient Chief Complaint Ms. Blatter presents today for follow-up evaluation of her diabetic foot ulcers and abdominal wound. Electronic Signature(s) Signed: 10/07/2016 5:27:22 PM By: Baltazar Najjar MD Entered By: Baltazar Najjar on 10/07/2016 08:48:08 [name] ([id number]) -------------------------------------------------------------------------------- Debridement Details Patient Name: [name]. Date of Service: 10/07/2016 8:00 AM Medical Record Patient Account Number: [id number] Number: Treating RN: Phillis Haggis [birth year] (82 [name] Other Clinician: Date of Birth/Sex: Female) Treating Dawanda Mapel Primary Care Provider: [name] Provider/Extender: G Referring Provider: Charolotte Capuchin in Treatment: 42 Debridement Performed for Wound #9 Right Achilles Assessment: Performed By: Physician Maxwell Caul, MD Debridement: Debridement Pre-procedure Yes - 08:27 Verification/Time Out Taken: Start Time: 08:28 Pain Control: Lidocaine 4% Topical Solution Level: Skin/Subcutaneous Tissue Total Area Debrided (L x 0.1 (cm) x 0.1 (cm) = 0.01 (cm) W): Tissue and other Non-Viable, Exudate, Fibrin/Slough, Subcutaneous material debrided: Instrument: Curette Bleeding: Minimum Hemostasis Achieved: Pressure End Time: 08:30 Procedural Pain: 0 Post Procedural Pain: 0 Response to Treatment: Procedure was tolerated well Post Debridement Measurements of Total  Wound Length: (cm) 0.2 Width: (cm) 0.2 Depth: (cm) 0.1 Volume: (cm) 0.003 Character of Wound/Ulcer Post Requires Further Debridement Debridement: Severity of Tissue Post Debridement: Fat layer exposed Post Procedure Diagnosis Same as Pre-procedure Electronic Signature(s) Signed: 10/07/2016 5:11:51 PM By: Alejandro Mulling Signed: 10/07/2016 5:27:22 PM By: Baltazar Najjar MD [name] ([id number]) Entered By: Baltazar Najjar on 10/07/2016 08:47:58 [name] ([id number]) -------------------------------------------------------------------------------- HPI Details Patient Name: [name], [name]. Date of Service: 10/07/2016 8:00 AM Medical Record Patient Account Number: [id number] Number: Treating RN: Phillis Haggis 18-Apr-1934 (82 [name] Other Clinician: Date of Birth/Sex: Female) Treating Gayland Nicol Primary Care Provider: [name] Provider/Extender: G Referring Provider: Charolotte Capuchin in Treatment: 2 History of Present Illness Location: right posterior heel, right Achilles, right lower quadrant abdomen, right fourth toe amp site Quality: denies pain to any wound Severity: not applicable Timing: denies pain HPI Description: 83 year old patient who is known to be diabetic, was referred to [place] by Dr. Gavin Potters for a right heel ulceration which she's had for a while. She was recently in hospital for a pneumonia and at that time and got delirious and was disoriented and sometime during this time developed a stage II ulcer on her right heel. Her past medical history is significant for bilateral pneumonia which was treated with injectable antibiotics and then to oral Levaquin which he has completed. She also has acute on chronic diastolic CHF, acute on chronic respiratory failure, end-stage renal disease on hemodialysis, atrial fibrillation, recent stroke, diabetes mellitus. The patient and her son are poor historians but from what I understand she was  admitted to the hospital with an acute vascular compromise of her right lower extremity and Dr. Wyn Quaker has done a surgical procedure and we are trying to obtain these notes. There are also some vascular workup done and we will try and obtain these notes. the injury to the left lower quadrant of abdomen and the suprapubic area have been there due to a bruise and have been there for several months and no intervention has been done. 10/11/2015 --  on review of the electronics records it was noted that the patient was admitted to the hospital on 09/14/2015 with peripheral vascular disease with claudication, end-stage renal disease, pressure ulcer, chronic atrial fibrillation. She was seen by Dr. Wyn Quaker who did her right lower extremity angiogram , angioplasty of the right anterior tibial artery and thrombolysis with TPA of the right popliteal artery, and thrombectomy. She was seen by Dr. Wyn Quaker during this past week and he was pleased with the progress. He did say that if he took her to the operating room for any procedure he would debride the abdominal wound under anesthesia. She was also seen by Dr. Ether Griffins the podiatrist who thought that she may lose her right fourth toe at some stage may need an amputation of this. 10/21/2015 --patient known to Dr. Wyn Quaker and his last office visit from 10/04/2015 has been reviewed. 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 She also had a persistent ulceration of the right heel and markedly this area and a right third and fourth toe and a small scab on the calf but these are dry and seemed to be improving. Patient also has a left carotid endarterectomy and multiple interventions to a right brachiocephalic AV fistula. After the visit he had recommended noninvasive studies to recheck her revascularization. He was off the impression that she would likely lose  the right fourth toe and the third toe was likely to heal. [name] ([id number]) He was concerned about underlying muscle necrosis on her right heel and midfoot. 11/01/2015 -- an echo done in January of this year showed her left ventricular ejection fraction to be about 50-55%. The patient was seen by the PA and Dr. Driscilla Grammes office and the plan was to take her to the operating room soon to have a debridement under anesthesia for the abdominal wall wound, the Achilles tendon on the right leg and amputation of the right fourth toe. The daughter and the patient do not feel that they would be able to undergo hyperbaric oxygen therapy 5 days a week for 6 weeks. 12/17/15; this is a medically complex woman who I note was recently in this clinic however I was not involved with her care. She returns today with multiple wounds; a) she has a wound in the mid abdomen that is been there since March of this year. I note that she is been to the overall for debridement recently. The exact etiology of this wound is not really clear b) left lower quadrant abdominal wound had some sanguinous drainage when she came in here. The patient fell in January and thinks this may have been secondary to a hematoma. c): The patient has 3 wounds on her right leg including a small wound on the right mid calf, a large area over the Achilles which currently has a wound VAC for the last 6 weeks, also a smaller wound on the distal part of the right heel. As far as I understand most of these wounds are currently been dressed with's calcium alginate. According to her daughter the Achilles wound under the wound VAC is doing well d) the patient is had an amputation of her left fifth toe in January and the right fourth toe 6 weeks ago secondary to diabetic PAD e) the patient has chronic renal failure on dialysis for the last 2 years secondary to type 2 diabetes on insulin. The daughter's knowledge there is  been no biopsy of the  abdominal wounds given their current appearance and lack of undefined etiology at have to wonder about calciphylaxis. 12/18/15:Addendum; I [facility] I can see no relevant x-rays of the right heel. I note her arteriogram and revascularization of her right lower extremity in April 2017. She had debridement of both abdominal wounds and the right heel and Achilles wound on 11/07/15 at which time she had a right fourth toe ray amputation. The abdominal wounds were debridement again on 6/29. I do not see any relevant pathology of these abdominal wounds 12/24/15; culture I did of the drainage from the midline abdominal wound last week showed both Proteus and ampicillin sensitive enterococcus. I've given her a course of Augmentin adjusted on dialysis days. She has no specific complaints today. Been using Santyl to the abdominal wounds in the right leg wound and the wound VAC on the right Achilles which was initially prescribed by Dr. dew 12/31/15; I have done two punch biopsies of the large midline abdominal. 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 May have been a trauma component of the one on the left lower quadrant however the midline wound had no such history. She has a large area on the right Achilles heel with a wound VAC prescribed by Dr. dew. A small wound on the right anterior leg.Marland Kitchen UNFORTUNATELY she has 2 new wounds today. One on the left heel which is probably a pressure area. As well her previous amputation site of her right fourth toe has dehisced and now has a small wound with significant depth at the amputation site. 01/14/2016 -- she returns after 2 weeks and had had a punch biopsy of abdominal wound done the last visit -- had a biopsy of her midline abdominal wound done and the Pathology diagnosis is that of ulceration, necrosis and inflammation and negative for dysplasia and malignancy. 01/21/16. 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 In the meantime she has new wounds of the left heel the right fourth toe amputation site is opened up. The back is stopped to the right heel area. 01/28/16; the abdominal wounds continued to improve. The extensive wound on her right Achilles also looks stable except for the lower aspect of the wound where there is a large liquefied area that probes right down to her calcaneus. This cultured Proteus last week I have her on Augmentin and doxycycline 1. I think this is [name], [name] ([id number]) going to need a course of IV antibiotics and I will call dialysis. X-ray I did last week was negative, I think she is going to need an MRI 02/04/16; right heel MRI as before Saturday. Receiving I believe IV Rocephin at dialysis 02/11/16; as it turns out the patient could not have a MRI as she has a bladder stimulator in place even though it is not currently in use since the beginning of this year. Although she has an allergy to IV contrast she apparently has done well with premedication so we will have to go for a CT scan with contrast. In the meantime she has had a fall now has a large skin tear on her left upper arm. She went to the ER and they suggested Tegaderm over topical antibiotics 03/03/16 currently patient returns after having been hospitalized for 2 weeks and subsequently transferred to [facility] nursing facility. She actually seems to be doing excellent compared to even when we last saw her according to our nursing  staff. Both patient and her daughter are extremely encouraged at how well she is presenting at this point in time. Overall the biggest issue is still the right Achilles area which is being managed at this point in time by Dr. Wyn Quakerew. Patient is currently utilizing a wound VAC to that region. 03/17/16; patient is at [facility] still. Using Aquacel Ag to the wounds on her bilateral feet and still Prisma to the small open area on her abdomen. 03/31/16  at this point in time patient has been tolerating the dressing changes currently. She fortunately has no worsening of her symptoms although she tells me that the nurse who is caring [facility] decided that nothing was needed in regard to the lower abdominal wound from a dressing standpoint at this time. she is really not having significant discomfort or pain at this point she continues to have some tunneling in the proximal Achilles wound region. 04/07/16 patient continues to do well on evaluation today. Even the Achilles wound which has been the most tender is not giving her as much trouble. Unfortunately the PolyMem dressings that we utilize last week really did not seem to benefit her in particular. Obviously we will discontinue that at this point in time today. 04-15-16:Ms. Feltz is accompanied today by her daughter. She is still residing in an SNF undergoing dialysis, continues to receive antibiotics during dialysis as prescribed by Dr. Sampson GoonFitzgerald of infectious disease. She has a follow-up appointment with Dr. Sampson GoonFitzgerald on 04-17-16 to discuss the continuation of these antibiotics. Ms. Abbe AmsterdamHopkins denies any pain to any of the 3 remaining wounds she does admit to changing to [employer] surgical shoes for safety while ambulating with physical therapy. She denies any falls since her last appointment here although she and her daughter do admit to increased tremors of unclear etiology since her last appointment. She has tolerated the dressing changes that were prescribed last week. 04-22-16 Ms. Landrus presents today accompanied by her daughter for evaluation of her diabetic foot ulcers. She is still residing in an SNF, she continues dialysis 3 times weekly. She'll follow up with Dr. Sampson GoonFitzgerald last Friday, and at that appointment IV antibiotics were discontinued. According to Ms. Jerger and her daughter if there is any regression of her wounds Dr. Sampson GoonFitzgerald will consider  re-starting the antibiotics. Ms. Abbe AmsterdamHopkins daughter states that since the discontinuation of antibiotic therapy her "twitches" have resolved. 04/29/16; I have not seen this patient in quite some time however she is completed triple antibiotic therapy given at dialysis for osteomyelitis as prescribed by infectious disease. She is still being followed by Dr. dew of vascular surgery. We are using Hydrofera Blue to the surface of these wounds. She currently has 2 open areas a substantial area over her Achilles area although this is a lot better than the last time I saw this. She also has a small wound superiorly from this wound which has a superior probing depth of 2 cm. 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 My biopsy did not prove this nevertheless I think this is the correct clinical diagnosis. We are now following her for an area on the right Achilles part of her heel. This is not any different from last week. Natalie RimaHOPKINS, Natalie Y. ([id number]) She also has a small  tunneling area just above this. And unfortunately this week there is been a reopening of an area where her right fourth toe was previously amputated. She is completed antibiotics 05/13/16; she has a new reopening on the mid abdominal wound in the same site is previously. This is a small open area. Apligraf #1 today to the areas on the right heel o2 still an open area in the fourth toe amputation site 05/27/16; the areas on her abdomen are completely closed over. Small open area from last time is closed. Apligraf #2 today to the areas on the right heel. The 4th toe amputation site is also heel 06/10/16; we did not have an Apligraf to put on today. This appeared tunneling wound on the right posterior calf appears to be  closed. The more substantial area on her Achilles itself is improved. 06/24/16; I reapplied her third Apligraf today although we have not had one debridement last time. The original tunneling wound is not as closed as last time. The more substantial area on her Achilles itself is improved with advancing epithelialization 07/08/16 Apligraf #4 today. The original long area over her Achilles as a healthy-looking area at the superior aspect and a small divot inferiorly. Both of the wound surface is look healthy 07/22/16; patient arrived today with a 2 original wounds on the Achilles part of her ankle too small for another application of Apligraf. This is on the right Achilles area. The small divot inferiorly looks as though it may have skin over most of this and I can't really see an open area. She did have an erythematous area over the medial malleolus with some drainage this looks like a rapid injury not cellulitis 07/29/16; erythematous area over the medial malleolus seems a lot better this week. This was a wrap injury. The areas on the right Achilles is still a small divot. The area superiorly down somewhat in size. 08/05/16; condition is generally not feeling well. No major changed either wound. Using Prisma 08/19/16; patient was last here she was admitted to hospital last week. Apparently she was complaining mostly of abdominal pain however a CT scan of the abdomen and pelvis revealed changes in the L2-L3 interspace suspicious for discitis and osteomyelitis as well as changes in the adjacent paraspinal and psoas muscles. She was seen by infectious disease Dr. Sampson Goon. Her urine culture grew group B strep. An aspirate of the fluid at L2-L3 I reviewed today. Her AFB Gram stain was negative, fungal culture is still in place. C + S is negative so far as of today. She is on vancomycin and ceftazidine dosed at dialysis for 6 weeks. She is not complaining of fever or back pain currently. We have been using  [employer] and to her wounds 08/26/16; patient is still receiving IV antibiotics at dialysis. She does not feel well complaining of a lot of pain in the right hip radiating down her leg. She rubs the lateral aspect of the right hip over the greater trochanter. Her wound measurements were a lot of different this week although I don't think they were accurately recorded last week the wound on the right Achilles area looks about the same. Superficial without any depth. 09/09/16; patient is still receiving IV antibiotics at dialysis chide believe are vancomycin and ceftazidine. Since she was last here she is developed a new wound on the tip of her right great toe. The right Achilles wound had significant surface slough/necrotic material on arrival today Also of note the patient seems to  be developing widespread myoclonic jerking which is gotten a lot worse over the last week to 2 although our intake nurse and the patient's daughter both state that she had had this at certain times in the past I had not specifically noticed this. Apparently at one point this was due to phosphorus abnormalities which were corrected at dialysis. I have reviewed her medication list I don't see any usual offenders except she is on Paxil Neurontin and Ultram although she takes the Ultram sparingly maybe once a week 09/15/16; still on vancomycin and ceftazidine as I understand things at dialysis. The area that was on the tip of her right great toe identified last week looks like an ischemic wound. She is known diabetic PAD and has had previous revascularization in 2017 by Dr. Wyn Quaker. It is possible she may need to see him again. I will probably attempt debridement of this next week we are using topical Santyl. In the meantime her heel wound has closed superiorly she is only left with a small wound at the inferior part of the Achilles area which was her deeper part of this wound at one point however even this area looks  better 09/23/16; the patient has been to see Dr. Sampson Goon and I gather because of the myoclonic jerking he is [name], [name] ([id number]) changed one of her antibiotics probably the ceftazidine. Concerning today is the tip of her right great toe which seems to be an expanding necrotic area. She has known severe PAD and this looks like an ischemic wound to me. She has previously been revascularized by Dr.Dew in nearly part of 2017. We continue to make reasonable progression with the heel wound using Hydrofera Blue. We are using Prisma to the great toe on the right. 09/30/16; the patient looks a lot better today apparently her Rocephin was stopped also perhaps the vancomycin. She states she didn't get any antibiotics at dialysis. We have been using Hydrofera Blue to the heel area and Santyl to the right great toe. She has a follow-up with Dr. dew on 5/10with regards to the right toe wound which looks ischemic 10/07/16; the patient sees Dr. dew tomorrow however this is apparently about a dialysis access issue, I am hoping he will have a look at the right first toe. I'm thinking he will probably need to do another arterial study. The area on the right Achilles heel is for all intents and purposes resolved although there is still some foam herbal-looking areas under callus. We are using Hydrofera Blue to the Achilles and Santyl to the right first toe Electronic Signature(s) Signed: 10/07/2016 5:27:22 PM By: Baltazar Najjar MD Entered By: Baltazar Najjar on 10/07/2016 08:49:54 [name] ([id number]) -------------------------------------------------------------------------------- Physical Exam Details Patient Name: [name] Date of Service: 10/07/2016 8:00 AM Medical Record Patient Account Number: [id number] Number: Treating RN: Phillis Haggis Feb 25, 1934 (82 [name] Other Clinician: Date of Birth/Sex: Female) Treating Ngoc Detjen Primary Care Provider: [name] Provider/Extender: G Referring Provider: Charolotte Capuchin in Treatment: 53 Constitutional Sitting or standing Blood Pressure is within target range for patient.. Pulse regular and within target range for patient.Marland Kitchen Respirations regular, non-labored and within target range.. Temperature is normal and within the target range for the patient.. Patient's appearance is neat and clean. Appears in no acute distress. Well nourished and well developed.. Notes Wound exam; I removed a smaller amount of surface eschar from the Achilles area using a #3 curet. There is some very small areas that look vulnerable  here enough that I'm going to continue the [facility] and dressings for another week but I am expecting to close this out next week. The area on the left first toe apparently measures slightly smaller per our intake nurse although it is still covered with a necrotic surface. I did attempt to debridement this 2 weeks ago it returned rapidly therefore I been awaiting for further arterial workup before changing anything from the central lower doing further debridement Electronic Signature(s) Signed: 10/07/2016 5:27:22 PM By: Baltazar Najjar MD Entered By: Baltazar Najjar on 10/07/2016 08:51:26 [name] ([id number]) -------------------------------------------------------------------------------- Physician Orders Details Patient Name: [name]. Date of Service: 10/07/2016 8:00 AM Medical Record Patient Account Number: [id number] Number: Treating RN: Phillis Haggis 06-12-1933 (82 [name] Other Clinician: Date of Birth/Sex: Female) Treating Cassey Bacigalupo Primary Care Provider: [name] Provider/Extender: G Referring Provider: Charolotte Capuchin in Treatment: 66 Verbal / Phone Orders: Yes Clinician: Ashok Cordia, Debi Read Back and Verified: Yes Diagnosis Coding Wound Cleansing Wound #20 Right Toe Great o Clean wound with Normal Saline. o Cleanse wound  with mild soap and water o May shower with protection. Wound #9 Right Achilles o Clean wound with Normal Saline. o Cleanse wound with mild soap and water o May shower with protection. Anesthetic Wound #20 Right Toe Great o Topical Lidocaine 4% cream applied to wound bed prior to debridement - all wounds for clinic use only Wound #9 Right Achilles o Topical Lidocaine 4% cream applied to wound bed prior to debridement - all wounds for clinic use only Primary Wound Dressing Wound #20 Right Toe Great o Santyl Ointment Wound #9 Right Achilles o Hydrafera Blue Secondary Dressing Wound #20 Right Toe Great o Dry Gauze o Kerlix and Coban Wound #9 Right Achilles o ABD pad o Conform/Kerlix [name], [name] ([id number]) Dressing Change Frequency Wound #20 Right Toe Great o Change dressing every other day. Wound #9 Right Achilles o Change dressing every other day. Follow-up Appointments Wound #20 Right Toe Great o Return Appointment in 1 week. Wound #9 Right Achilles o Return Appointment in 1 week. Edema Control Wound #9 Right Achilles o Kerlix and Coban - Right Lower Extremity - do not wrap too tight and not the whole leg o Elevate legs to the level of the heart and pump ankles as often as possible Off-Loading Wound #20 Right Toe Great o Other: - Float heels Wound #9 Right Achilles o Other: - Float heels Electronic Signature(s) Signed: 10/07/2016 5:11:51 PM By: Alejandro Mulling Signed: 10/07/2016 5:27:22 PM By: Baltazar Najjar MD Entered By: Alejandro Mulling on 10/07/2016 08:30:46 [name] ([id number]) -------------------------------------------------------------------------------- Problem List Details Patient Name: [name], [name]. Date of Service: 10/07/2016 8:00 AM Medical Record Patient Account Number: [id number] Number: Treating RN: Phillis Haggis 03-01-1934 (82 [name] Other Clinician: Date of Birth/Sex: Female)  Treating Leah Skora Primary Care Provider: [name] Provider/Extender: G Referring Provider: Charolotte Capuchin in Treatment: 12 Active Problems ICD-10 Encounter Code Description Active Date Diagnosis E11.621 Type 2 diabetes mellitus with foot ulcer 12/17/2015 Yes L97.512 Non-pressure chronic ulcer of other part of right foot with 03/31/2016 Yes fat layer exposed E11.51 Type 2 diabetes mellitus with diabetic peripheral 12/17/2015 Yes angiopathy without gangrene Inactive Problems ICD-10 Code Description Active Date Inactive Date M86.671 Other chronic osteomyelitis, right ankle and foot 04/15/2016 04/15/2016 S31.104A Unspecified open wound of abdominal wall, left lower 12/17/2015 12/17/2015 quadrant without penetration into peritoneal cavity, initial encounter S31.103D Unspecified open wound of abdominal wall, right lower 04/15/2016 04/15/2016 quadrant  without penetration into peritoneal cavity, subsequent encounter Resolved Problems Electronic Signature(s) HANALEI, GLACE ([id number]) Signed: 10/07/2016 5:27:22 PM By: Baltazar Najjar MD Entered By: Baltazar Najjar on 10/07/2016 08:47:32 [name] ([id number]) -------------------------------------------------------------------------------- Progress Note Details Patient Name: [name] Date of Service: 10/07/2016 8:00 AM Medical Record Patient Account Number: [id number] Number: Treating RN: Phillis Haggis [birth year] (82 [name] Other Clinician: Date of Birth/Sex: Female) Treating Geraldina Parrott Primary Care Provider: [name] Provider/Extender: G Referring Provider: Charolotte Capuchin in Treatment: 85 Subjective Chief Complaint Information obtained from Patient Ms. Fujiwara presents today for follow-up evaluation of her diabetic foot ulcers and abdominal wound. History of Present Illness (HPI) The following HPI elements were documented for the patient's wound: Location: right  posterior heel, right Achilles, right lower quadrant abdomen, right fourth toe amp site Quality: denies pain to any wound Severity: not applicable Timing: denies pain 83 year old patient who is known to be diabetic, was referred to [place] by Dr. Gavin Potters for a right heel ulceration which she's had for a while. She was recently in hospital for a pneumonia and at that time and got delirious and was disoriented and sometime during this time developed a stage II ulcer on her right heel. Her past medical history is significant for bilateral pneumonia which was treated with injectable antibiotics and then to oral Levaquin which he has completed. She also has acute on chronic diastolic CHF, acute on chronic respiratory failure, end-stage renal disease on hemodialysis, atrial fibrillation, recent stroke, diabetes mellitus. The patient and her son are poor historians but from what I understand she was admitted to the hospital with an acute vascular compromise of her right lower extremity and Dr. Wyn Quaker has done a surgical procedure and we are trying to obtain these notes. There are also some vascular workup done and we will try and obtain these notes. the injury to the left lower quadrant of abdomen and the suprapubic area have been there due to a bruise and have been there for several months and no intervention has been done. 10/11/2015 -- on review of the electronics records it was noted that the patient was admitted to the hospital on 09/14/2015 with peripheral vascular disease with claudication, end-stage renal disease, pressure ulcer, chronic atrial fibrillation. She was seen by Dr. Wyn Quaker who did her right lower extremity angiogram , angioplasty of the right anterior tibial artery and thrombolysis with TPA of the right popliteal artery, and thrombectomy. She was seen by Dr. Wyn Quaker during this past week and he was pleased with the progress. He did say that if he took her to the operating room for any procedure  he would debride the abdominal wound under anesthesia. She was also seen by Dr. Ether Griffins the podiatrist who thought that she may lose her right fourth toe at some stage may need an amputation of this. [name], [name] ([id number]) 10/21/2015 --patient known to Dr. Wyn Quaker and his last office visit from 10/04/2015 has been reviewed. 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 She also had a persistent ulceration of the right heel and markedly this area and a right third and fourth toe and a small scab on the calf but these are dry and seemed to be improving. Patient also has a left carotid endarterectomy and multiple interventions to a right brachiocephalic AV fistula. After the visit he had recommended noninvasive studies to recheck her revascularization. He was off  the impression that she would likely lose the right fourth toe and the third toe was likely to heal. He was concerned about underlying muscle necrosis on her right heel and midfoot. 11/01/2015 -- an echo done in January of this year showed her left ventricular ejection fraction to be about 50-55%. The patient was seen by the PA and Dr. Driscilla Grammes office and the plan was to take her to the operating room soon to have a debridement under anesthesia for the abdominal wall wound, the Achilles tendon on the right leg and amputation of the right fourth toe. The daughter and the patient do not feel that they would be able to undergo hyperbaric oxygen therapy 5 days a week for 6 weeks. 12/17/15; this is a medically complex woman who I note was recently in this clinic however I was not involved with her care. She returns today with multiple wounds; a) she has a wound in the mid abdomen that is been there since March of this year. I note that she is been to the overall for debridement recently. The exact etiology of this wound is not  really clear b) left lower quadrant abdominal wound had some sanguinous drainage when she came in here. The patient fell in January and thinks this may have been secondary to a hematoma. c): The patient has 3 wounds on her right leg including a small wound on the right mid calf, a large area over the Achilles which currently has a wound VAC for the last 6 weeks, also a smaller wound on the distal part of the right heel. As far as I understand most of these wounds are currently been dressed with's calcium alginate. According to her daughter the Achilles wound under the wound VAC is doing well d) the patient is had an amputation of her left fifth toe in January and the right fourth toe 6 weeks ago secondary to diabetic PAD e) the patient has chronic renal failure on dialysis for the last 2 years secondary to type 2 diabetes on insulin. The daughter's knowledge there is been no biopsy of the abdominal wounds given their current appearance and lack of undefined etiology at have to wonder about calciphylaxis. 12/18/15:Addendum; I have reviewed [facility] I can see no relevant x-rays of the right heel. I note her arteriogram and revascularization of her right lower extremity in April 2017. She had debridement of both abdominal wounds and the right heel and Achilles wound on 11/07/15 at which time she had a right fourth toe ray amputation. The abdominal wounds were debridement again on 6/29. I do not see any relevant pathology of these abdominal wounds 12/24/15; culture I did of the drainage from the midline abdominal wound last week showed both Proteus and ampicillin sensitive enterococcus. I've given her a course of Augmentin adjusted on dialysis days. She has no specific complaints today. Been using Santyl to the abdominal wounds in the right leg wound and the wound VAC on the right Achilles which was initially prescribed by Dr. dew 12/31/15; I have done two punch biopsies of the large midline  abdominal. 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 May have been a trauma component of the one on the left lower quadrant however the midline wound had no such history. She has a large area on the right Achilles heel with a wound VAC prescribed by Dr. dew. A small wound on the right anterior leg.Marland Kitchen UNFORTUNATELY she has 2 new wounds today. One on the left heel which is probably a pressure  area. As well her previous amputation site of her right fourth toe has dehisced and now has a small wound with significant depth at the amputation site. 01/14/2016 -- she returns after 2 weeks and had had a punch biopsy of abdominal wound done the last visit [name], [name] ([id number]) -- had a biopsy of her midline abdominal wound done and the Pathology diagnosis is that of ulceration, necrosis and inflammation and negative for dysplasia and malignancy. 01/21/16. 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 In the meantime she has new wounds of the left heel the right fourth toe amputation site is opened up. The back is stopped to the right heel area. 01/28/16; the abdominal wounds continued to improve. The extensive wound on her right Achilles also looks stable except for the lower aspect of the wound where there is a large liquefied area that probes right down to her calcaneus. This cultured Proteus last week I have her on Augmentin and doxycycline 1. I think this is going to need a course of IV antibiotics and I will call dialysis. X-ray I did last week was negative, I think she is going to need an MRI 02/04/16; right heel MRI as before Saturday. Receiving I believe IV Rocephin at dialysis 02/11/16; as it turns out the patient could not have a MRI as she has a bladder stimulator in place even though it is not currently in use since the beginning of this year. Although she has an allergy to IV contrast she apparently has done well with premedication  so we will have to go for a CT scan with contrast. In the meantime she has had a fall now has a large skin tear on her left upper arm. She went to the ER and they suggested Tegaderm over topical antibiotics 03/03/16 currently patient returns after having been hospitalized for 2 weeks and subsequently transferred to [facility] nursing facility. She actually seems to be doing excellent compared to even when we last saw her according to our nursing staff. Both patient and her daughter are extremely encouraged at how well she is presenting at this point in time. Overall the biggest issue is still the right Achilles area which is being managed at this point in time by Dr. Wyn Quaker. Patient is currently utilizing a wound VAC to that region. 03/17/16; patient is at [facility] nursing home still. Using Aquacel Ag to the wounds on her bilateral feet and still Prisma to the small open area on her abdomen. 03/31/16 at this point in time patient has been tolerating the dressing changes currently. She fortunately has no worsening of her symptoms although she tells me that the nurse who is caring for her at [facility] nursing facility decided that nothing was needed in regard to the lower abdominal wound from a dressing standpoint at this time. she is really not having significant discomfort or pain at this point she continues to have some tunneling in the proximal Achilles wound region. 04/07/16 patient continues to do well on evaluation today. Even the Achilles wound which has been the most tender is not giving her as much trouble. Unfortunately the PolyMem dressings that we utilize last week really did not seem to benefit her in particular. Obviously we will discontinue that at this point in time today. 04-15-16:Ms. Bembenek is accompanied today by her daughter. She is still residing in an SNF undergoing dialysis, continues to receive antibiotics during dialysis as prescribed by Dr.  Fitzgerald of infectious disease. She  has a follow-up appointment with Dr. Sampson Goon on 04-17-16 to discuss the continuation of these antibiotics. Ms. Harroun denies any pain to any of the 3 remaining wounds she does admit to changing to [employer] surgical shoes for safety while ambulating with physical therapy. She denies any falls since her last appointment here although she and her daughter do admit to increased tremors of unclear etiology since her last appointment. She has tolerated the dressing changes that were prescribed last week. 04-22-16 Ms. Lehr presents today accompanied by her daughter for evaluation of her diabetic foot ulcers. She is still residing in an SNF, she continues dialysis 3 times weekly. She'll follow up with Dr. Sampson Goon last Friday, and at that appointment IV antibiotics were discontinued. According to Ms. Levit and her daughter if there is any regression of her wounds Dr. Sampson Goon will consider re-starting the antibiotics. Ms. Sian daughter states that since the discontinuation of antibiotic therapy her "twitches" have resolved. 04/29/16; I have not seen this patient in quite some time however she is completed triple antibiotic therapy given at dialysis for osteomyelitis as prescribed by infectious disease. She is still being followed by Dr. Wyn Quaker [name] ([id number]) of vascular surgery. We are using Hydrofera Blue to the surface of these wounds. She currently has 2 open areas a substantial area over her Achilles area although this is a lot better than the last time I saw this. She also has a small wound superiorly from this wound which has a superior probing depth of 2 cm. 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 My biopsy did not prove this nevertheless I think  this is the correct clinical diagnosis. We are now following her for an area on the right Achilles part of her heel. This is not any different from last week. She also has a small tunneling area just above this. And unfortunately this week there is been a reopening of an area where her right fourth toe was previously amputated. She is completed antibiotics 05/13/16; she has a new reopening on the mid abdominal wound in the same site is previously. This is a small open area. Apligraf #1 today to the areas on the right heel o2 still an open area in the fourth toe amputation site 05/27/16; the areas on her abdomen are completely closed over. Small open area from last time is closed. Apligraf #2 today to the areas on the right heel. The 4th toe amputation site is also heel 06/10/16; we did not have an Apligraf to put on today. This appeared tunneling wound on the right posterior calf appears to be closed. The more substantial area on her Achilles itself is improved. 06/24/16; I reapplied her third Apligraf today although we have not had one debridement last time. The original tunneling wound is not as closed as last time. The more substantial area on her Achilles itself is improved with advancing epithelialization 07/08/16 Apligraf #4 today. The original long area over her Achilles as a healthy-looking area at the superior aspect and a small divot inferiorly. Both of the wound surface is look healthy 07/22/16; patient arrived today with a 2 original wounds on the Achilles part of her ankle too small for another application of Apligraf. This is on the right Achilles area.  The small divot inferiorly looks as though it may have skin over most of this and I can't really see an open area. She did have an erythematous area over the medial malleolus with some drainage this looks like a rapid injury not cellulitis 07/29/16; erythematous area over the medial malleolus seems a lot better this week. This was a wrap  injury. The areas on the right Achilles is still a small divot. The area superiorly down somewhat in size. 08/05/16; condition is generally not feeling well. No major changed either wound. Using Prisma 08/19/16; patient was last here she was admitted to hospital last week. Apparently she was complaining mostly of abdominal pain however a CT scan of the abdomen and pelvis revealed changes in the L2-L3 interspace suspicious for discitis and osteomyelitis as well as changes in the adjacent paraspinal and psoas muscles. She was seen by infectious disease Dr. Sampson Goon. Her urine culture grew group B strep. An aspirate of the fluid at L2-L3 I reviewed today. Her AFB Gram stain was negative, fungal culture is still in place. C + S is negative so far as of today. She is on vancomycin and ceftazidine dosed at dialysis for 6 weeks. She is not complaining of fever or back pain currently. We have been using [employer] and to her wounds 08/26/16; patient is still receiving IV antibiotics at dialysis. She does not feel well complaining of a lot of pain in the right hip radiating down her leg. She rubs the lateral aspect of the right hip over the greater trochanter. Her wound measurements were a lot of different this week although I don't think they were accurately recorded last week the wound on the right Achilles area looks about the same. Superficial without any depth. 09/09/16; patient is still receiving IV antibiotics at dialysis chide believe are vancomycin and ceftazidine. Since she was last here she is developed a new wound on the tip of her right great toe. The right Achilles wound had significant surface slough/necrotic material on arrival today Also of note the patient seems to be developing widespread myoclonic jerking which is gotten a lot worse over the last week to 2 although our intake nurse and the patient's daughter both state that she had had this at certain times in the past I had not  specifically noticed this. Apparently at one point this was due to phosphorus abnormalities which were corrected at dialysis. I have reviewed her medication list I don't see any usual offenders except she is on Paxil Neurontin and Ultram although she takes the Ultram sparingly [name], [name] ([id number]) maybe once a week 09/15/16; still on vancomycin and ceftazidine as I understand things at dialysis. The area that was on the tip of her right great toe identified last week looks like an ischemic wound. She is known diabetic PAD and has had previous revascularization in 2017 by Dr. Wyn Quaker. It is possible she may need to see him again. I will probably attempt debridement of this next week we are using topical Santyl. In the meantime her heel wound has closed superiorly she is only left with a small wound at the inferior part of the Achilles area which was her deeper part of this wound at one point however even this area looks better 09/23/16; the patient has been to see Dr. Sampson Goon and I gather because of the myoclonic jerking he is changed one of her antibiotics probably the ceftazidine. Concerning today is the tip of her right great toe which seems  to be an expanding necrotic area. She has known severe PAD and this looks like an ischemic wound to me. She has previously been revascularized by Dr.Dew in nearly part of 2017. We continue to make reasonable progression with the heel wound using Hydrofera Blue. We are using Prisma to the great toe on the right. 09/30/16; the patient looks a lot better today apparently her Rocephin was stopped also perhaps the vancomycin. She states she didn't get any antibiotics at dialysis. We have been using Hydrofera Blue to the heel area and Santyl to the right great toe. She has a follow-up with Dr. dew on 5/10with regards to the right toe wound which looks ischemic 10/07/16; the patient sees Dr. dew tomorrow however this is apparently about a dialysis access issue,  I am hoping he will have a look at the right first toe. I'm thinking he will probably need to do another arterial study. The area on the right Achilles heel is for all intents and purposes resolved although there is still some foam herbal-looking areas under callus. We are using Hydrofera Blue to the Achilles and Santyl to the right first toe Objective Constitutional Sitting or standing Blood Pressure is within target range for patient.. Pulse regular and within target range for patient.Marland Kitchen Respirations regular, non-labored and within target range.. Temperature is normal and within the target range for the patient.. Patient's appearance is neat and clean. Appears in no acute distress. Well nourished and well developed.. Vitals Time Taken: 8:10 AM, Height: 65 in, Weight: 160 lbs, BMI: 26.6, Pulse: 79 bpm, Respiratory Rate: 16 breaths/min, Blood Pressure: 125/48 mmHg. General Notes: Wound exam; I removed a smaller amount of surface eschar from the Achilles area using a #3 curet. There is some very small areas that look vulnerable here enough that I'm going to continue the Hydrafera and dressings for another week but I am expecting to close this out next week. The area on the left first toe apparently measures slightly smaller per our intake nurse although it is still covered with a necrotic surface. I did attempt to debridement this 2 weeks ago it returned rapidly therefore I been awaiting for further arterial workup before changing anything from the central lower doing further debridement Integumentary (Hair, Skin) Wound #20 status is Open. Original cause of wound was Gradually Appeared. The wound is located on the [place]. ([id number]) Right Toe Great. The wound measures 1.4cm length x 1.1cm width x 0.1cm depth; 1.21cm^2 area and 0.121cm^3 volume. There is no tunneling or undermining noted. There is a large amount of serous drainage noted. The wound margin is distinct with the outline  attached to the wound base. There is no granulation within the wound bed. There is a large (67-100%) amount of necrotic tissue within the wound bed including Adherent Slough. Periwound temperature was noted as No Abnormality. The periwound has tenderness on palpation. Wound #9 status is Open. Original cause of wound was Gradually Appeared. The wound is located on the Right Achilles. The wound measures 0.1cm length x 0.1cm width x 0.1cm depth; 0.008cm^2 area and 0.001cm^3 volume. There is no tunneling or undermining noted. There is a medium amount of serous drainage noted. The wound margin is distinct with the outline attached to the wound base. There is no granulation within the wound bed. There is a large (67-100%) amount of necrotic tissue within the wound bed including Eschar and Adherent Slough. Periwound temperature was noted as No Abnormality. The periwound has tenderness on palpation. Assessment Active  Problems ICD-10 E11.621 - Type 2 diabetes mellitus with foot ulcer L97.512 - Non-pressure chronic ulcer of other part of right foot with fat layer exposed E11.51 - Type 2 diabetes mellitus with diabetic peripheral angiopathy without gangrene Procedures Wound #9 Wound #9 is a Diabetic Wound/Ulcer of the Lower Extremity located on the Right Achilles . There was a Skin/Subcutaneous Tissue Debridement ([id number]) debridement with total area of 0.01 sq cm performed by Maxwell Caul, MD. with the following instrument(s): Curette to remove Non-Viable tissue/material including Exudate, Fibrin/Slough, and Subcutaneous after achieving pain control using Lidocaine 4% Topical Solution. A time out was conducted at 08:27, prior to the start of the procedure. A Minimum amount of bleeding was controlled with Pressure. The procedure was tolerated well with a pain level of 0 throughout and a pain level of 0 following the procedure. Post Debridement Measurements: 0.2cm length x 0.2cm width x 0.1cm  depth; 0.003cm^3 volume. Character of Wound/Ulcer Post Debridement requires further debridement. Severity of Tissue Post Debridement is: Fat layer exposed. Post procedure Diagnosis Wound #9: Same as Pre-Procedure [name], [name] ([id number]) Plan Wound Cleansing: Wound #20 Right Toe Great: Clean wound with Normal Saline. Cleanse wound with mild soap and water May shower with protection. Wound #9 Right Achilles: Clean wound with Normal Saline. Cleanse wound with mild soap and water May shower with protection. Anesthetic: Wound #20 Right Toe Great: Topical Lidocaine 4% cream applied to wound bed prior to debridement - all wounds for clinic use only Wound #9 Right Achilles: Topical Lidocaine 4% cream applied to wound bed prior to debridement - all wounds for clinic use only Primary Wound Dressing: Wound #20 Right Toe Great: Santyl Ointment Wound #9 Right Achilles: Hydrafera Blue Secondary Dressing: Wound #20 Right Toe Great: Dry Gauze Kerlix and Coban Wound #9 Right Achilles: ABD pad Conform/Kerlix Dressing Change Frequency: Wound #20 Right Toe Great: Change dressing every other day. Wound #9 Right Achilles: Change dressing every other day. Follow-up Appointments: Wound #20 Right Toe Great: Return Appointment in 1 week. Wound #9 Right Achilles: Return Appointment in 1 week. Edema Control: Wound #9 Right Achilles: Kerlix and Coban - Right Lower Extremity - do not wrap too tight and not the whole leg Elevate legs to the level of the heart and pump ankles as often as possible Off-Loading: Wound #20 Right Toe Great: Other: - Float heels Wound #9 Right Achilles: Other: - Float heels JAMESE, TRAUGER ([id number]) #1 I continue the Hydrofera Blue to the right Achilles area for a further week. I think week and close this out next week or least I'm hoping to do so #2 the area on the tip of her right great toe is worrisome from an ischemic point of view although the  toe itself is warm there is no surrounding erythema. Attempts at debridement of this area that I did 2 or 3 weeks ago resulted in rapid return of the surface. I am hoping that week and get noninvasive vascular studies arranged by Dr. Wyn Quaker. I thought the appointment tomorrow was for follow-up of her vascular issues although I think this is a dialysis access issue. Her daughter is going to try to get Dr. Wyn Quaker to have a quick look at the toe #3 the patient may be discharged from her nursing home facility we will stand by to assist with any transition issues as it applies to the wound Electronic Signature(s) Signed: 10/07/2016 5:27:22 PM By: Baltazar Najjar MD Entered By: Baltazar Najjar on 10/07/2016 08:54:02 Stormer, Vinetta Bergamo. (  [id number]) -------------------------------------------------------------------------------- SuperBill Details Patient Name: DORAINE, SCHEXNIDER. Date of Service: 10/07/2016 Medical Record Patient Account Number: [id number] Number: Treating RN: Phillis Haggis 01-09-1934 (80 [name] Other Clinician: Date of Birth/Sex: Female) Treating Pauline Trainer Primary Care Provider: [name] Provider/Extender: G Referring Provider: Charolotte Capuchin in Treatment: 42 Diagnosis Coding ICD-10 Codes Code Description E11.621 Type 2 diabetes mellitus with foot ulcer L97.512 Non-pressure chronic ulcer of other part of right foot with fat layer exposed E11.51 Type 2 diabetes mellitus with diabetic peripheral angiopathy without gangrene Facility Procedures CPT4 Code Description: 40981191 11042 - DEB SUBQ TISSUE 20 SQ CM/< ICD-10 Description Diagnosis E11.621 Type 2 diabetes mellitus with foot ulcer L97.512 Non-pressure chronic ulcer of other part of right fo Modifier: ot with fat la Quantity: 1 yer exposed Physician Procedures CPT4 Code Description: 4782956 11042 - WC PHYS SUBQ TISS 20 SQ CM ICD-10 Description Diagnosis E11.621 Type 2 diabetes mellitus with foot ulcer  L97.512 Non-pressure chronic ulcer of other part of right fo Modifier: ot with fat lay Quantity: 1 er exposed Electronic Signature(s) Signed: 10/07/2016 5:27:22 PM By: Baltazar Najjar MD Entered By: Baltazar Najjar on 10/07/2016 08:54:20

## 2016-10-09 NOTE — Progress Notes (Signed)
[name], [name] ([id number]) Visit Report for 10/07/2016 Arrival Information Details Patient Name: [name], [name]. Date of Service: 10/07/2016 8:00 AM Medical Record Patient Account Number: [id number] Number: Treating RN: Phillis Haggis 12-25-1933 (80 [name] Other Clinician: Date of Birth/Sex: Female) Treating ROBSON, MICHAEL Primary Care Krishiv Sandler: [name] Lanah Steines/Extender: G Referring Governor Matos: Charolotte Capuchin in Treatment: 41 Visit Information History Since Last Visit All ordered tests and consults were completed: No Patient Arrived: Wheel Chair Added or deleted any medications: No Arrival Time: 08:05 Any new allergies or adverse reactions: No Accompanied By: daughter Had a fall or experienced change in No Transfer Assistance: EasyPivot Patient activities of daily living that may affect Lift risk of falls: Patient Identification Verified: Yes Signs or symptoms of abuse/neglect since last No Secondary Verification Process Yes visito Completed: Hospitalized since last visit: No Patient Requires Transmission- No Has Dressing in Place as Prescribed: Yes Based Precautions: Has Compression in Place as Prescribed: Yes Patient Has Alerts: Yes Pain Present Now: No Patient Alerts: Patient on Blood Thinner DMII Warfarin ABI [place] Bilateral NO BP RIGHT ARM Electronic Signature(s) Signed: 10/07/2016 5:11:51 PM By: Alejandro Mulling Entered By: Alejandro Mulling on 10/07/2016 08:12:52 [name] ([id number]) -------------------------------------------------------------------------------- Encounter Discharge Information Details Patient Name: [name]. Date of Service: 10/07/2016 8:00 AM Medical Record Patient Account Number: [id number] Number: Treating RN: Phillis Haggis [birth year] (80 [name] Other Clinician: Date of Birth/Sex: Female) Treating ROBSON, MICHAEL Primary Care Noelia Lenart: [name] Thanos Cousineau/Extender: G Referring  Ladarious Kresse: Charolotte Capuchin in Treatment: 22 Encounter Discharge Information Items Discharge Pain Level: 0 Discharge Condition: Stable Ambulatory Status: Wheelchair Discharge Destination: Nursing Home Transportation: Private Auto Accompanied By: daughter Schedule Follow-up Appointment: Yes Medication Reconciliation completed No and provided to Patient/Care Zaia Carre: Provided on Clinical Summary of Care: 10/07/2016 Form Type Recipient Paper Patient [facility] Electronic Signature(s) Signed: 10/07/2016 8:45:55 AM By: Gwenlyn Perking Entered By: Gwenlyn Perking on 10/07/2016 08:45:55 [name] ([id number]) -------------------------------------------------------------------------------- Multi Wound Chart Details Patient Name: [name] Date of Service: 10/07/2016 8:00 AM Medical Record Patient Account Number: [id number] Number: Treating RN: Phillis Haggis Aug 31, 1933 (80 [name] Other Clinician: Date of Birth/Sex: Female) Treating ROBSON, MICHAEL Primary Care Darrelle Barrell: [name] Nevia Henkin/Extender: G Referring Magdeline Prange: Charolotte Capuchin in Treatment: 42 Vital Signs Height(in): 65 Pulse(bpm): 79 Weight(lbs): 160 Blood Pressure 125/48 (mmHg): Body Mass Index(BMI): 27 Temperature(F): Respiratory Rate 16 (breaths/min): Photos: [20:No Photos] [9:No Photos] [N/A:N/A] Wound Location: [20:Right Toe Great] [9:Right Achilles] [N/A:N/A] Wounding Event: [20:Gradually Appeared] [9:Gradually Appeared] [N/A:N/A] Primary Etiology: [20:Atypical] [9:Diabetic Wound/Ulcer of N/A the Lower Extremity] Secondary Etiology: [20:N/A] [9:Arterial Insufficiency Ulcer N/A] Comorbid History: [20:Arrhythmia, Congestive Heart Failure, Hypertension, Type II Diabetes] [9:Arrhythmia, Congestive N/A Heart Failure, Hypertension, Type II Diabetes] Date Acquired: [20:09/09/2016] [9:06/02/2015] [N/A:N/A] Weeks of Treatment: [20:4] [9:42] [N/A:N/A] Wound Status: [20:Open] [9:Open]  [N/A:N/A] Pending Amputation on No [9:Yes] [N/A:N/A] Presentation: Measurements L x W x D 1.4x1.1x0.1 [9:0.1x0.1x0.1] [N/A:N/A] (cm) Area (cm) : [20:1.21] [9:0.008] [N/A:N/A] Volume (cm) : [20:0.121] [9:0.001] [N/A:N/A] % Reduction in Area: [20:-114.20%] [9:100.00%] [N/A:N/A] % Reduction in Volume: -112.30% [9:100.00%] [N/A:N/A] Classification: [20:Partial Thickness] [9:Grade 2] [N/A:N/A] HBO Classification: [20:Grade 1] [9:N/A] [N/A:N/A] Exudate Amount: [20:Large] [9:Medium] [N/A:N/A] Exudate Type: [20:Serous] [9:Serous] [N/A:N/A] Exudate Color: [20:amber] [9:amber] [N/A:N/A] Wound Margin: [20:Distinct, outline attached] [9:Distinct, outline attached] [N/A:N/A] Granulation Amount: [20:None Present (0%)] [9:None Present (0%)] [N/A:N/A] Necrotic Amount: Large (67-100%) Large (67-100%) N/A Necrotic Tissue: Adherent Slough Eschar, Adherent Slough N/A Epithelialization: None Medium (34-66%) N/A Debridement: N/A Debridement (08657- N/A 11047) Pre-procedure N/A 08:27  N/A Verification/Time Out Taken: Pain Control: N/A Lidocaine 4% Topical N/A Solution Tissue Debrided: N/A Fibrin/Slough, Exudates, N/A Subcutaneous Level: N/A Skin/Subcutaneous N/A Tissue Debridement Area (sq N/A 0.01 N/A cm): Instrument: N/A Curette N/A Bleeding: N/A Minimum N/A Hemostasis Achieved: N/A Pressure N/A Procedural Pain: N/A 0 N/A Post Procedural Pain: N/A 0 N/A Debridement Treatment N/A Procedure was tolerated N/A Response: well Post Debridement N/A 0.2x0.2x0.1 N/A Measurements L x W x D (cm) Post Debridement N/A 0.003 N/A Volume: (cm) Periwound Skin Texture: No Abnormalities Noted No Abnormalities Noted N/A Periwound Skin No Abnormalities Noted No Abnormalities Noted N/A Moisture: Periwound Skin Color: No Abnormalities Noted No Abnormalities Noted N/A Temperature: No Abnormality No Abnormality N/A Tenderness on Yes Yes N/A Palpation: Wound Preparation: Ulcer Cleansing: Ulcer Cleansing:  N/A Rinsed/Irrigated with Rinsed/Irrigated with Saline Saline Topical Anesthetic Topical Anesthetic Applied: Other: lidocaine Applied: Other: lidocaine 4% 4% Procedures Performed: N/A Debridement N/A Treatment Notes Wound #20 (Right Toe Great) 1. Cleansed with: Clean wound with Normal Saline 2. Anesthetic ZENAYA, ULATOWSKI ([id number]) Topical Lidocaine 4% cream to wound bed prior to debridement 4. Dressing Applied: Santyl Ointment 5. Secondary Dressing Applied Dry Gauze Kerlix/Conform 7. Secured with Tape Wound #9 (Right Achilles) 1. Cleansed with: Clean wound with Normal Saline 2. Anesthetic Topical Lidocaine 4% cream to wound bed prior to debridement 4. Dressing Applied: Hydrafera Blue 5. Secondary Dressing Applied ABD Pad Kerlix/Conform 7. Secured with Tape Notes kerlix, coban; gauze and conform Electronic Signature(s) Signed: 10/07/2016 5:27:22 PM By: Baltazar Najjar MD Entered By: Baltazar Najjar on 10/07/2016 08:47:42 [name] ([id number]) -------------------------------------------------------------------------------- Multi-Disciplinary Care Plan Details Patient Name: [name], [name]. Date of Service: 10/07/2016 8:00 AM Medical Record Patient Account Number: [id number] Number: Treating RN: Phillis Haggis 08/12/1933 (80 [name] Other Clinician: Date of Birth/Sex: Female) Treating ROBSON, MICHAEL Primary Care Marlyn Rabine: [name] Stephie Xu/Extender: G Referring Kaizley Aja: Charolotte Capuchin in Treatment: 83 Active Inactive ` Abuse / Safety / Falls / Self Care Management Nursing Diagnoses: Impaired physical mobility Potential for falls Goals: Patient will remain injury free Date Initiated: 12/17/2015 Target Resolution Date: 08/29/2016 Goal Status: Active Interventions: Assess fall risk on admission and as needed Notes: ` Necrotic Tissue Nursing Diagnoses: Impaired tissue integrity related to necrotic/devitalized tissue Knowledge  deficit related to management of necrotic/devitalized tissue Goals: Necrotic/devitalized tissue will be minimized in the wound bed Date Initiated: 02/04/2016 Target Resolution Date: 08/29/2016 Goal Status: Active Interventions: Assess patient pain level pre-, during and post procedure and prior to discharge Provide education on necrotic tissue and debridement process Treatment Activities: Apply topical anesthetic as ordered : 02/04/2016 [name], [name] ([id number]) Notes: ` Wound/Skin Impairment Nursing Diagnoses: Impaired tissue integrity Goals: Patient/caregiver will verbalize understanding of skin care regimen Date Initiated: 12/17/2015 Target Resolution Date: 08/29/2016 Goal Status: Active Ulcer/skin breakdown will have a volume reduction of 30% by week 4 Date Initiated: 12/17/2015 Target Resolution Date: 08/29/2016 Goal Status: Active Ulcer/skin breakdown will have a volume reduction of 50% by week 8 Date Initiated: 12/17/2015 Target Resolution Date: 08/29/2016 Goal Status: Active Ulcer/skin breakdown will have a volume reduction of 80% by week 12 Date Initiated: 12/17/2015 Target Resolution Date: 08/29/2016 Goal Status: Active Ulcer/skin breakdown will heal within 14 weeks Date Initiated: 12/17/2015 Target Resolution Date: 08/29/2016 Goal Status: Active Interventions: Assess patient/caregiver ability to obtain necessary supplies Assess patient/caregiver ability to perform ulcer/skin care regimen upon admission and as needed Assess ulceration(s) every visit Notes: Electronic Signature(s) Signed: 10/07/2016 5:11:51 PM By: Alejandro Mulling Entered By: Alejandro Mulling on 10/07/2016 08:28:56  [name], [name] ([id number]) -------------------------------------------------------------------------------- Pain Assessment Details Patient Name: [name], [name]. Date of Service: 10/07/2016 8:00 AM Medical Record Patient Account Number: [id number] Number: Treating RN:  Phillis Haggis 1933-07-06 (80 [name] Other Clinician: Date of Birth/Sex: Female) Treating ROBSON, MICHAEL Primary Care Yousra Ivens: [name] Milen Lengacher/Extender: G Referring Jamori Biggar: Charolotte Capuchin in Treatment: 52 Active Problems Location of Pain Severity and Description of Pain Patient Has Paino No Site Locations With Dressing Change: No Pain Management and Medication Current Pain Management: Electronic Signature(s) Signed: 10/07/2016 5:11:51 PM By: Alejandro Mulling Entered By: Alejandro Mulling on 10/07/2016 08:13:00 [name] ([id number]) -------------------------------------------------------------------------------- Patient/Caregiver Education Details Patient Name: [name]. Date of Service: 10/07/2016 8:00 AM Medical Record Patient Account Number: [id number] Number: Treating RN: Phillis Haggis [birth year] (80 [name] Other Clinician: Date of Birth/Gender: Female) Treating ROBSON, MICHAEL Primary Care Physician: [name] Physician/Extender: G Referring Physician: Charolotte Capuchin in Treatment: 39 Education Assessment Education Provided To: Patient Education Topics Provided Wound/Skin Impairment: Handouts: Other: change dressing as ordered Methods: Demonstration, Explain/Verbal Responses: State content correctly Electronic Signature(s) Signed: 10/07/2016 5:11:51 PM By: Alejandro Mulling Entered By: Alejandro Mulling on 10/07/2016 08:32:24 [name] ([id number]) -------------------------------------------------------------------------------- Wound Assessment Details Patient Name: [name]. Date of Service: 10/07/2016 8:00 AM Medical Record Patient Account Number: [id number] Number: Treating RN: Phillis Haggis 10/23/1933 ([name] Other Clinician: Date of Birth/Sex: Female) Treating ROBSON, MICHAEL Primary Care Kloee Ballew: [name] Adessa Primiano/Extender: G Referring Zona Pedro: Charolotte Capuchin in  Treatment: 42 Wound Status Wound Number: 20 Primary Atypical Etiology: Wound Location: Right Toe Great Wound Open Wounding Event: Gradually Appeared Status: Date Acquired: 09/09/2016 Comorbid Arrhythmia, Congestive Heart Failure, Weeks Of Treatment: 4 History: Hypertension, Type II Diabetes Clustered Wound: No Photos Photo Uploaded By: Alejandro Mulling on 10/07/2016 15:22:46 Wound Measurements Length: (cm) 1.4 Width: (cm) 1.1 Depth: (cm) 0.1 Area: (cm) 1.21 Volume: (cm) 0.121 % Reduction in Area: -114.2% % Reduction in Volume: -112.3% Epithelialization: None Tunneling: No Undermining: No Wound Description Classification: Partial Thickness Diabetic Severity (Wagner): Grade 1 Wound Margin: Distinct, outline attach Exudate Amount: Large Exudate Type: Serous Exudate Color: amber Foul Odor After Cleansing: No Slough/Fibrino No ed Wound Bed Granulation Amount: None Present (0%) Necrotic Amount: Large (67-100%) [name], [name] ([id number]) Necrotic Quality: Adherent Slough Periwound Skin Texture Texture Color No Abnormalities Noted: No No Abnormalities Noted: No Moisture Temperature / Pain No Abnormalities Noted: No Temperature: No Abnormality Tenderness on Palpation: Yes Wound Preparation Ulcer Cleansing: Rinsed/Irrigated with Saline Topical Anesthetic Applied: Other: lidocaine 4%, Treatment Notes Wound #20 (Right Toe Great) 1. Cleansed with: Clean wound with Normal Saline 2. Anesthetic Topical Lidocaine 4% cream to wound bed prior to debridement 4. Dressing Applied: Santyl Ointment 5. Secondary Dressing Applied Dry Gauze Kerlix/Conform 7. Secured with Secretary/administrator) Signed: 10/07/2016 5:11:51 PM By: Alejandro Mulling Entered By: Alejandro Mulling on 10/07/2016 08:23:35 [name] ([id number]) -------------------------------------------------------------------------------- Wound Assessment Details Patient Name: [name] Date of Service: 10/07/2016 8:00 AM Medical Record Patient Account Number: [id number] Number: Treating RN: Phillis Haggis 11/14/1933 (80 [name] Other Clinician: Date of Birth/Sex: Female) Treating ROBSON, MICHAEL Primary Care Deeann Servidio: [name] Shell Yandow/Extender: G Referring Francella Barnett: Charolotte Capuchin in Treatment: 42 Wound Status Wound Number: 9 Primary Diabetic Wound/Ulcer of the Lower Etiology: Extremity Wound Location: Right Achilles Secondary Arterial Insufficiency Ulcer Wounding Event: Gradually Appeared Etiology: Date Acquired: 06/02/2015 Wound Open Weeks Of Treatment: 42 Status: Clustered Wound: No Comorbid Arrhythmia, Congestive Heart Pending Amputation On Presentation History: Failure,  Hypertension, Type II Diabetes Photos Photo Uploaded By: Alejandro MullingPinkerton, Debra on 10/07/2016 15:23:14 Wound Measurements Length: (cm) 0.1 Width: (cm) 0.1 Depth: (cm) 0.1 Area: (cm) 0.008 Volume: (cm) 0.001 % Reduction in Area: 100% % Reduction in Volume: 100% Epithelialization: Medium (34-66%) Tunneling: No Undermining: No Wound Description Classification: Grade 2 Foul Odor After Wound Margin: Distinct, outline attached Slough/Fibrino Exudate Amount: Medium Exudate Type: Serous Exudate Color: amber Cleansing: No Yes Wound Bed [name], [name]. ([id number]) Granulation Amount: None Present (0%) Necrotic Amount: Large (67-100%) Necrotic Quality: Eschar, Adherent Slough Periwound Skin Texture Texture Color No Abnormalities Noted: No No Abnormalities Noted: No Moisture Temperature / Pain No Abnormalities Noted: No Temperature: No Abnormality Tenderness on Palpation: Yes Wound Preparation Ulcer Cleansing: Rinsed/Irrigated with Saline Topical Anesthetic Applied: Other: lidocaine 4%, Treatment Notes Wound #9 (Right Achilles) 1. Cleansed with: Clean wound with Normal Saline 2. Anesthetic Topical Lidocaine 4% cream to wound bed prior to  debridement 4. Dressing Applied: Hydrafera Blue 5. Secondary Dressing Applied ABD Pad Kerlix/Conform 7. Secured with Tape Notes kerlix, coban; gauze and conform Electronic Signature(s) Signed: 10/07/2016 5:11:51 PM By: Alejandro MullingPinkerton, Debra Entered By: Alejandro MullingPinkerton, Debra on 10/07/2016 08:28:34 [name], [name]. ([id number]) -------------------------------------------------------------------------------- Vitals Details Patient Name: [name], Tanyia [name]. Date of Service: 10/07/2016 8:00 AM Medical Record Patient Account Number: [id number] Number: Treating RN: Phillis Haggisinkerton, Debi 09-01-1933 (82 [name] Other Clinician: Date of Birth/Sex: Female) Treating ROBSON, MICHAEL Primary Care Zymier Rodgers: Rolm GalaGrandis, Heidi Greenleigh Kauth/Extender: G Referring Jayton Popelka: Charolotte CapuchinGrandis, Heidi Weeks in Treatment: 6342 Vital Signs Time Taken: 08:10 Pulse (bpm): 79 Height (in): 65 Respiratory Rate (breaths/min): 16 Weight (lbs): 160 Blood Pressure (mmHg): 125/48 Body Mass Index (BMI): 26.6 Reference Range: 80 - 120 mg / dl Electronic Signature(s) Signed: 10/07/2016 5:11:51 PM By: Alejandro MullingPinkerton, Debra Entered By: Alejandro MullingPinkerton, Debra on 10/07/2016 08:15:03

## 2016-10-11 ENCOUNTER — Other Ambulatory Visit (INDEPENDENT_AMBULATORY_CARE_PROVIDER_SITE_OTHER): Payer: Self-pay | Admitting: Vascular Surgery

## 2016-10-13 NOTE — Progress Notes (Signed)
Subjective:    Patient ID: [name], female    DOB: [birth year], 83 y.o.   MRN: [id number] Chief Complaint  Patient presents with  . Follow-up   Patient presents for a HDA follow up. Last seen on 06/08/16 during a permcath removal. States her dialysis access is working well. The patient underwent a duplex ultrasound of the AV access which was notable for a patent fistula with a stenosis noted at the confluence (this has been an area of stenosis in the past) and proximal upper arm. The patient denies any issues with hemodialysis such as cannulation problems, increased bleeding, decrease in doppler flow or recirculation. The patient also denies any fistula skin breakdown, pain, edema, pallor or ulceration of the arm / hand. I was asked to look at the patients right first big toe. Daughter states wound has been present for several week and does not seem to be improving. The patient denies any claudication or rest pain. Denies any fever, nausea or vomiting. Receiving right heel wound care from Dr. [name].    Review of Systems  Constitutional: Negative.   HENT: Negative.   Eyes: Negative.   Respiratory: Negative.   Gastrointestinal: Negative.   Endocrine: Negative.   Genitourinary:       ESRD  Musculoskeletal: Negative.   Skin:       Wound on right first big toe  Allergic/Immunologic: Negative.   Neurological: Negative.   Hematological: Negative.   Psychiatric/Behavioral: Negative.       Objective:   Physical Exam  Constitutional: She is oriented to person, place, and time. She appears well-developed and well-nourished. No distress.  HENT:  Head: Normocephalic and atraumatic.  Eyes: Conjunctivae are normal. Pupils are equal, round, and reactive to light.  Neck: Normal range of motion.  Cardiovascular: Normal rate, regular rhythm and normal heart sounds.   Pulses:      Radial pulses are 2+ on the right side, and 2+ on the left side.       Dorsalis pedis pulses are 0 on the right  side, and 0 on the left side.       Posterior tibial pulses are 0 on the right side, and 0 on the left side.  Right Upper Extremity: good bruit and thrill  Pulmonary/Chest: Effort normal.  Musculoskeletal: Normal range of motion. She exhibits no edema.  Neurological: She is alert and oriented to person, place, and time.  Skin: She is not diaphoretic.     First Toe: Fibrinous exudate noted. Shallow. Minimal signs of healing noted. Non-infected. Right Heel: With wound dressing intact.   Psychiatric: She has a normal mood and affect. Her behavior is normal. Judgment and thought content normal.  Vitals reviewed.  BP 116/70   Pulse 82   Resp 16   Wt 145 lb (65.8 kg)   BMI 24.13 kg/m   Past Medical History:  Diagnosis Date  . Anemia   . Anxiety   . Arthritis    gout  . Atrial fibrillation (HCC)   . CHF (congestive heart failure) (HCC)    has had this off and on prior to dialysis but has had several times since  . Chronic kidney disease    end stage renal insufficiency.  on dialysis M,W,F  . Diabetes mellitus without complication (HCC)   . Dialysis patient [facility])    Mon.- Wed- Fri.  . Dysrhythmia    afib/flutter  . GERD (gastroesophageal reflux disease)   . Hypertension   . Neuropathy due to  type 2 diabetes mellitus (HCC)   . Peripheral vascular disease (HCC)   . Pleural effusion   . Pulmonary hypertension (HCC)   . Renal insufficiency   . Restless leg syndrome   . Shortness of breath dyspnea    with exertion  . Stroke (HCC) 06/2015   has had 1 other stroke. first one affected vision. the most recent one affected right arm   Social History   Social History  . Marital status: Widowed    Spouse name: N/A  . Number of children: N/A  . Years of education: N/A   Occupational History  . Not on file.   Social History Main Topics  . Smoking status: Never Smoker  . Smokeless tobacco: Never Used  . Alcohol use No  . Drug use: No  . Sexual activity: No   Other Topics  Concern  . Not on file   Social History Narrative  . No narrative on file   Past Surgical History:  Procedure Laterality Date  . AMPUTATION Right 11/07/2015   Procedure: AMPUTATION DIGIT ( 4TH TOE, RIGHT FOOT );  Surgeon: Annice Needy, MD;  Location: [facility] ORS;  Service: Vascular;  Laterality: Right;  . AMPUTATION TOE Left 06/07/2015   Procedure: AMPUTATION TOE;  Surgeon: Gwyneth Revels, DPM;  Location: [facility] ORS;  Service: Podiatry;  Laterality: Left;  . CARDIAC CATHETERIZATION    . CATARACT EXTRACTION, BILATERAL Bilateral   . CHOLECYSTECTOMY    . DILATION AND CURETTAGE OF UTERUS    . ENDARTERECTOMY Left 07/26/2015   Procedure: ENDARTERECTOMY CAROTID;  Surgeon: Renford Dills, MD;  Location: [facility] OR[facility];  Service: Vascular;  Laterality: Left;  . EYE SURGERY    . FISTULAGRAM ([facility] HX)    . HEEL DECUBITUS ULCER EXCISION Right 05/13/2016  . INCISION AND DRAINAGE ABSCESS Right 02/16/2016   Procedure: INCISION AND DRAINAGE ABSCESS and application wound vac;  Surgeon: Bertram Denver, MD;  Location: [facility] OR[facility];  Service: Urology;  Laterality: Right;  . IR GENERIC HISTORICAL  08/14/2016   IR FLUORO GUIDED NEEDLE PLC ASPIRATION/INJECTION LOC 08/14/2016 [facility]-INTERV RAD  . JOINT REPLACEMENT     bilateral hip replacement  . MEDTRONIC BLADDER INTERSTEM     currently turned off for MRI in January  . PARATHYROIDECTOMY    . PERIPHERAL VASCULAR CATHETERIZATION N/A 10/08/2014   Procedure: A/V Shuntogram/Fistulagram;  Surgeon: Annice Needy, MD;  Location: [facility] IN[facility];  Service: Cardiovascular;  Laterality: N/A;  . PERIPHERAL VASCULAR CATHETERIZATION N/A 03/04/2015   Procedure: A/V Shuntogram/Fistulagram;  Surgeon: Annice Needy, MD;  Location: [facility];  Service: Cardiovascular;  Laterality: N/A;  . PERIPHERAL VASCULAR CATHETERIZATION N/A 03/04/2015   Procedure: A/V Shunt Intervention;  Surgeon: Annice Needy, MD;  Location: [facility];  Service: Cardiovascular;  Laterality: N/A;  .  PERIPHERAL VASCULAR CATHETERIZATION N/A 09/16/2015   Procedure: Lower Extremity Angiography;  Surgeon: Annice Needy, MD;  Location: [facility];  Service: Cardiovascular;  Laterality: N/A;  . PERIPHERAL VASCULAR CATHETERIZATION  09/16/2015   Procedure: Lower Extremity Intervention;  Surgeon: Annice Needy, MD;  Location: [facility];  Service: Cardiovascular;;  . PERIPHERAL VASCULAR CATHETERIZATION Right 04/27/2016   Procedure: A/V Shuntogram/Fistulagram;  Surgeon: Annice Needy, MD;  Location: [facility];  Service: Cardiovascular;  Laterality: Right;  . PERIPHERAL VASCULAR CATHETERIZATION Left 05/14/2016   Procedure: DIALYSIS/PERMA CATHETER INSERTION;  Surgeon: Annice Needy, MD;  Location: [facility];  Service: Vascular;  Laterality:  Left;  . PERIPHERAL VASCULAR CATHETERIZATION N/A 06/08/2016   Procedure: Dialysis/Perma Catheter Removal;  Surgeon: Annice Needy, MD;  Location: [facility];  Service: Cardiovascular;  Laterality: N/A;  . REVISON OF ARTERIOVENOUS FISTULA Right 05/14/2016   Procedure: REVISON OF ARTERIOVENOUS FISTULA ( WITH ARTEGRAFT );  Surgeon: Annice Needy, MD;  Location: [facility];  Service: Vascular;  Laterality: Right;  . THYROIDECTOMY, PARTIAL    . TONSILLECTOMY    . WOUND DEBRIDEMENT Right 11/07/2015   Procedure: DEBRIDEMENT WOUND ( RIGHT HEEL AND ABDOMINAL WOUND (2) );  Surgeon: Annice Needy, MD;  Location: [facility];  Service: Vascular;  Laterality: Right;  . WOUND DEBRIDEMENT N/A 11/28/2015   Procedure: DEBRIDEMENT WOUND ( ABDOMINAL );  Surgeon: Annice Needy, MD;  Location: [facility];  Service: Vascular;  Laterality: N/A;   Family History  Problem Relation Age of Onset  . Colon cancer Mother   . Stroke Father   . Diabetes Brother    Allergies  Allergen Reactions  . Codeine Itching and Other (See Comments)    Reaction:  Confusion/Hallucinations   . Contrast Media [Iodinated Diagnostic Agents] Other (See Comments)    "skin peel" breaks into a  blistered rash, then peel. If given Benadryl and solumedrol she will be okay  . Oxycodone Other (See Comments)    Reaction:  Confusion/Hallucinations   . Quinine Derivatives Rash  . Tape Other (See Comments)    Skin is very thin and will blister and pull off skin with any tape except paper tape.  PLEASE USE PAPER TAPE.      Assessment & Plan:  Patient presents for a HDA follow up. Last seen on 06/08/16 during a permcath removal. States her dialysis access is working well. The patient underwent a duplex ultrasound of the AV access which was notable for a patent fistula with a stenosis noted at the confluence (this has been an area of stenosis in the past) and proximal upper arm. The patient denies any issues with hemodialysis such as cannulation problems, increased bleeding, decrease in doppler flow or recirculation. The patient also denies any fistula skin breakdown, pain, edema, pallor or ulceration of the arm / hand. I was asked to look at the patients right first big toe. Daughter states wound has been present for several week and does not seem to be improving. The patient denies any claudication or rest pain. Denies any fever, nausea or vomiting. Receiving right heel wound care from Dr. [name].   1. End stage renal disease on dialysis (HCC) - Worsening Access working well as per patient however there is a significant stenosis noted at the confluence and proximal arm. Recommend fistulogram with possible intervention to correct this and allow access to keep running Procedure, risk and benefits explained to patient. All questions answered. Patient and daughter wish to proceed.  Will schedule.  2. Atherosclerosis of native arteries of the extremities with ulceration (HCC) - Worsening Patient with new right first toe ulceration. Slow to heal right heel ulceration. Patient with PNHx of severe PAD with gangrene Recommend RLE angiogram with possible intervention. Procedure, risk and benefits explained  to patient. All questions answered. Patient and daughter wish to proceed.  Will schedule.  Current Outpatient Prescriptions on File Prior to Visit  Medication Sig Dispense Refill  . acetaminophen (TYLENOL) 650 MG CR tablet Take 1,300 mg by mouth 2 (two) times daily.    Marland Kitchen albuterol (PROVENTIL HFA;VENTOLIN HFA) 108 (90 Base) MCG/ACT inhaler Inhale 1 puff into  the lungs every 6 (six) hours as needed for wheezing or shortness of breath.     . Biotin 1 MG CAPS Take 1 mg by mouth daily.     . calcium acetate (PHOSLO) 667 MG tablet Take 1,334 mg by mouth 3 (three) times daily with meals. With any food    . cefTAZidime 2 g in dextrose 5 % 50 mL Inject 2 g into the vein every Monday, Wednesday, and Friday with hemodialysis. 16 Dose 0  . diltiazem (TIAZAC) 120 MG 24 hr capsule Take 120 mg by mouth every morning.     . folic acid-vitamin b complex-vitamin c-selenium-zinc (DIALYVITE) 3 MG TABS tablet Take 1 tablet by mouth daily.    . furosemide (LASIX) 80 MG tablet Take 80 mg by mouth 2 (two) times daily.    Marland Kitchen. gabapentin (NEURONTIN) 100 MG capsule Take 100 mg by mouth every Tuesday, Thursday, Saturday, and Sunday. At bedtime    . insulin detemir (LEVEMIR) 100 UNIT/ML injection Inject 0.06 mLs (6 Units total) into the skin 2 (two) times daily. (Patient taking differently: Inject 10 Units into the skin 2 (two) times daily. ) 10 mL 11  . lidocaine-prilocaine (EMLA) cream Apply 1 application topically as needed (port access). Mon, Wed, Fri before dialysis    . Omega-3 Fatty Acids (FISH OIL) 1000 MG CAPS Take 1,000 mg by mouth 2 (two) times daily.    Marland Kitchen. PARoxetine (PAXIL) 40 MG tablet Take 40 mg by mouth at bedtime.     . pravastatin (PRAVACHOL) 40 MG tablet Take 40 mg by mouth every evening.    . ranitidine (ZANTAC) 150 MG tablet Take 150 mg by mouth at bedtime.     . traMADol (ULTRAM) 50 MG tablet Take 1 tablet (50 mg total) by mouth every 6 (six) hours as needed for moderate pain. 30 tablet 0  . warfarin  (COUMADIN) 4 MG tablet Take 2-4 mg by mouth daily at 8 pm. Patient take 4mg  on Tuesday and Thursday. 2mg  on all other days.    Marland Kitchen. gabapentin (NEURONTIN) 300 MG capsule Take 300 mg by mouth every Monday, Wednesday, and Friday at 8 PM. At bedtime    . vancomycin (VANCOCIN) 1-5 GM/200ML-% SOLN Inject 200 mLs (1,000 mg total) into the vein every Monday, Wednesday, and Friday with hemodialysis. (Patient not taking: Reported on 10/08/2016) 16000 mL 0   No current facility-administered medications on file prior to visit.     There are no Patient Instructions on file for this visit. No Follow-up on file.   Jlon Betker A Emberlee Sortino, PA-C

## 2016-10-14 ENCOUNTER — Encounter: Payer: Medicare Other | Admitting: Internal Medicine

## 2016-10-14 DIAGNOSIS — E11621 Type 2 diabetes mellitus with foot ulcer: Secondary | ICD-10-CM | POA: Diagnosis not present

## 2016-10-16 ENCOUNTER — Other Ambulatory Visit: Payer: Self-pay | Admitting: Family Medicine

## 2016-10-16 ENCOUNTER — Other Ambulatory Visit: Payer: Medicare Other

## 2016-10-16 DIAGNOSIS — Z780 Asymptomatic menopausal state: Secondary | ICD-10-CM

## 2016-10-16 NOTE — Progress Notes (Signed)
JANNIFER, FISCHLER ([id number]) Visit Report for 10/14/2016 Chief Complaint Document Details Patient Name: JAVIA, DILLOW. Date of Service: 10/14/2016 8:45 AM Medical Record Patient Account Number: [id number] Number: Treating RN: Phillis Haggis 11/02/1933 (82 y.o. Other Clinician: Date of Birth/Sex: Female) Treating Lydiah Pong Primary Care Provider: [name] Provider/Extender: G Referring Provider: Charolotte Capuchin in Treatment: 13 Information Obtained from: Patient Chief Complaint Ms. Quaintance presents today for follow-up evaluation of her diabetic foot ulcers and abdominal wound. Electronic Signature(s) Signed: 10/14/2016 4:43:34 PM By: Baltazar Najjar MD Entered By: Baltazar Najjar on 10/14/2016 09:19:01 [name] ([id number]) -------------------------------------------------------------------------------- HPI Details Patient Name: [name] Date of Service: 10/14/2016 8:45 AM Medical Record Patient Account Number: [id number] Number: Treating RN: Phillis Haggis 1934/04/16 (82 y.o. Other Clinician: Date of Birth/Sex: Female) Treating Libbey Duce Primary Care Provider: [name] Provider/Extender: G Referring Provider: Charolotte Capuchin in Treatment: 88 History of Present Illness Location: right posterior heel, right Achilles, right lower quadrant abdomen, right fourth toe amp site Quality: denies pain to any wound Severity: not applicable Timing: denies pain HPI Description: 83 year old patient who is known to be diabetic, was referred to [place] by Dr. Gavin Potters for a right heel ulceration which she's had for a while. She was recently in hospital for a pneumonia and at that time and got delirious and was disoriented and sometime during this time developed a stage II ulcer on her right heel. Her past medical history is significant for bilateral pneumonia which was treated with injectable antibiotics and then to oral  Levaquin which he has completed. She also has acute on chronic diastolic CHF, acute on chronic respiratory failure, end-stage renal disease on hemodialysis, atrial fibrillation, recent stroke, diabetes mellitus. The patient and her son are poor historians but from what I understand she was admitted to the hospital with an acute vascular compromise of her right lower extremity and Dr. Wyn Quaker has done a surgical procedure and we are trying to obtain these notes. There are also some vascular workup done and we will try and obtain these notes. the injury to the left lower quadrant of abdomen and the suprapubic area have been there due to a bruise and have been there for several months and no intervention has been done. 10/11/2015 -- on review of the electronics records it was noted that the patient was admitted to the hospital on 09/14/2015 with peripheral vascular disease with claudication, end-stage renal disease, pressure ulcer, chronic atrial fibrillation. She was seen by Dr. Wyn Quaker who did her right lower extremity angiogram , angioplasty of the right anterior tibial artery and thrombolysis with TPA of the right popliteal artery, and thrombectomy. She was seen by Dr. Wyn Quaker during this past week and he was pleased with the progress. He did say that if he took her to the operating room for any procedure he would debride the abdominal wound under anesthesia. She was also seen by Dr. Ether Griffins the podiatrist who thought that she may lose her right fourth toe at some stage may need an amputation of this. 10/21/2015 --patient known to Dr. Wyn Quaker and his last office visit from 10/04/2015 has been reviewed. 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 She also had a persistent ulceration of the right heel and markedly this area and a right third and fourth toe and a small scab on the calf but these  are dry and seemed  to be improving. Patient also has a left carotid endarterectomy and multiple interventions to a right brachiocephalic AV fistula. After the visit he had recommended noninvasive studies to recheck her revascularization. He was off the impression that she would likely lose the right fourth toe and the third toe was likely to heal. [name] ([id number]) He was concerned about underlying muscle necrosis on her right heel and midfoot. 11/01/2015 -- an echo done in January of this year showed her left ventricular ejection fraction to be about 50-55%. The patient was seen by the PA and Dr. Driscilla Grammes office and the plan was to take her to the operating room soon to have a debridement under anesthesia for the abdominal wall wound, the Achilles tendon on the right leg and amputation of the right fourth toe. The daughter and the patient do not feel that they would be able to undergo hyperbaric oxygen therapy 5 days a week for 6 weeks. 12/17/15; this is a medically complex woman who I note was recently in this clinic however I was not involved with her care. She returns today with multiple wounds; a) she has a wound in the mid abdomen that is been there since March of this year. I note that she is been to the overall for debridement recently. The exact etiology of this wound is not really clear b) left lower quadrant abdominal wound had some sanguinous drainage when she came in here. The patient fell in January and thinks this may have been secondary to a hematoma. c): The patient has 3 wounds on her right leg including a small wound on the right mid calf, a large area over the Achilles which currently has a wound VAC for the last 6 weeks, also a smaller wound on the distal part of the right heel. As far as I understand most of these wounds are currently been dressed with's calcium alginate. According to her daughter the Achilles wound under the wound VAC is doing well d) the  patient is had an amputation of her left fifth toe in January and the right fourth toe 6 weeks ago secondary to diabetic PAD e) the patient has chronic renal failure on dialysis for the last 2 years secondary to type 2 diabetes on insulin. The daughter's knowledge there is been no biopsy of the abdominal wounds given their current appearance and lack of undefined etiology at have to wonder about calciphylaxis. 12/18/15:Addendum; I have reviewed [facility] I can see no relevant x-rays of the right heel. I note her arteriogram and revascularization of her right lower extremity in April 2017. She had debridement of both abdominal wounds and the right heel and Achilles wound on 11/07/15 at which time she had a right fourth toe ray amputation. The abdominal wounds were debridement again on 6/29. I do not see any relevant pathology of these abdominal wounds 12/24/15; culture I did of the drainage from the midline abdominal wound last week showed both Proteus and ampicillin sensitive enterococcus. I've given her a course of Augmentin adjusted on dialysis days. She has no specific complaints today. Been using Santyl to the abdominal wounds in the right leg wound and the wound VAC on the right Achilles which was initially prescribed by Dr. dew 12/31/15; I have done two punch biopsies of the large midline abdominal. 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 May have been a trauma component of the one on the left lower quadrant however the midline wound had no such history. She has a large area on  the right Achilles heel with a wound VAC prescribed by Dr. dew. A small wound on the right anterior leg.Marland Kitchen. UNFORTUNATELY she has 2 new wounds today. One on the left heel which is probably a pressure area. As well her previous amputation site of her right fourth toe has dehisced and now has a small wound with significant depth at the amputation site. 01/14/2016 -- she returns after 2 weeks and had had a punch biopsy of  abdominal wound done the last visit -- had a biopsy of her midline abdominal wound done and the Pathology diagnosis is that of ulceration, necrosis and inflammation and negative for dysplasia and malignancy. 01/21/16. 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 In the meantime she has new wounds of the left heel the right fourth toe amputation site is opened up. The back is stopped to the right heel area. 01/28/16; the abdominal wounds continued to improve. The extensive wound on her right Achilles also looks stable except for the lower aspect of the wound where there is a large liquefied area that probes right down to her calcaneus. This cultured Proteus last week I have her on Augmentin and doxycycline 1. I think this is Helane RimaHOPKINS, Tora Y. ([id number]) going to need a course of IV antibiotics and I will call dialysis. X-ray I did last week was negative, I think she is going to need an MRI 02/04/16; right heel MRI as before Saturday. Receiving I believe IV Rocephin at dialysis 02/11/16; as it turns out the patient could not have a MRI as she has a bladder stimulator in place even though it is not currently in use since the beginning of this year. Although she has an allergy to IV contrast she apparently has done well with premedication so we will have to go for a CT scan with contrast. In the meantime she has had a fall now has a large skin tear on her left upper arm. She went to the ER and they suggested Tegaderm over topical antibiotics 03/03/16 currently patient returns after having been hospitalized for 2 weeks and subsequently [facility]. She actually seems to be doing excellent compared to even when we last saw her according to our nursing staff. Both patient and her daughter are extremely encouraged at how well she is presenting at this point in time. Overall the biggest issue is still the right  Achilles area which is being managed at this point in time by Dr. Wyn Quakerew. Patient is currently utilizing a wound VAC to that region. 03/17/16; [facility]. Using Aquacel Ag to the wounds on her bilateral feet and still Prisma to the small open area on her abdomen. 03/31/16 at this point in time patient has been tolerating the dressing changes currently. She fortunately has no worsening of her symptoms although she tells me that the nurse who is caring [facility] decided that nothing was needed in regard to the lower abdominal wound from a dressing standpoint at this time. she is really not having significant discomfort or pain at this point she continues to have some tunneling in the proximal Achilles wound region. 04/07/16 patient continues to do well on evaluation today. Even the Achilles wound which has been the most tender is not giving her as much trouble. Unfortunately the PolyMem dressings that we utilize last week really did not seem to benefit her in particular.  Obviously we will discontinue that at this point in time today. 04-15-16:Ms. Schuneman is accompanied today by her daughter. She is still residing in an SNF undergoing dialysis, continues to receive antibiotics during dialysis as prescribed by Dr. Sampson Goon of infectious disease. She has a follow-up appointment with Dr. Sampson Goon on 04-17-16 to discuss the continuation of these antibiotics. Ms. Dattilio denies any pain to any of the 3 remaining wounds she does admit to changing to [employer] surgical shoes for safety while ambulating with physical therapy. She denies any falls since her last appointment here although she and her daughter do admit to increased tremors of unclear etiology since her last appointment. She has tolerated the dressing changes that were prescribed last week. 04-22-16 Ms. Wickstrom presents today accompanied by her daughter for evaluation of her diabetic  foot ulcers. She is still residing in an SNF, she continues dialysis 3 times weekly. She'll follow up with Dr. Sampson Goon last Friday, and at that appointment IV antibiotics were discontinued. According to Ms. Depolo and her daughter if there is any regression of her wounds Dr. Sampson Goon will consider re-starting the antibiotics. Ms. Hoopingarner daughter states that since the discontinuation of antibiotic therapy her "twitches" have resolved. 04/29/16; I have not seen this patient in quite some time however she is completed triple antibiotic therapy given at dialysis for osteomyelitis as prescribed by infectious disease. She is still being followed by Dr. dew of vascular surgery. We are using Hydrofera Blue to the surface of these wounds. She currently has 2 open areas a substantial area over her Achilles area although this is a lot better than the last time I saw this. She also has a small wound superiorly from this wound which has a superior probing depth of 2 cm. 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 My biopsy did not prove this nevertheless I think this is the correct clinical diagnosis. We are now following her for an area on the right Achilles part of her heel. This is not any different from last week. JAYCELYNN, KNICKERBOCKER ([id number]) She also has a small tunneling area just above this. And unfortunately this week there is been a reopening of an area where her right fourth toe was previously amputated. She is completed antibiotics 05/13/16; she has a new reopening on the mid abdominal wound in the same site is previously. This is a small open area. Apligraf #1 today to the areas on the right heel o2 still an open area in the fourth toe amputation site 05/27/16; the  areas on her abdomen are completely closed over. Small open area from last time is closed. Apligraf #2 today to the areas on the right heel. The 4th toe amputation site is also heel 06/10/16; we did not have an Apligraf to put on today. This appeared tunneling wound on the right posterior calf appears to be closed. The more substantial area on her Achilles itself is improved. 06/24/16; I reapplied her third Apligraf today although we have not had one debridement last time. The original tunneling wound is not as closed as last time. The more substantial area on her Achilles itself is improved with advancing epithelialization 07/08/16 Apligraf #4 today. The original long area over her Achilles as a healthy-looking area at the superior aspect and a small divot inferiorly.  Both of the wound surface is look healthy 07/22/16; patient arrived today with a 2 original wounds on the Achilles part of her ankle too small for another application of Apligraf. This is on the right Achilles area. The small divot inferiorly looks as though it may have skin over most of this and I can't really see an open area. She did have an erythematous area over the medial malleolus with some drainage this looks like a rapid injury not cellulitis 07/29/16; erythematous area over the medial malleolus seems a lot better this week. This was a wrap injury. The areas on the right Achilles is still a small divot. The area superiorly down somewhat in size. 08/05/16; condition is generally not feeling well. No major changed either wound. Using Prisma 08/19/16; patient was last here she was admitted to hospital last week. Apparently she was complaining mostly of abdominal pain however a CT scan of the abdomen and pelvis revealed changes in the L2-L3 interspace suspicious for discitis and osteomyelitis as well as changes in the adjacent paraspinal and psoas muscles. She was seen by infectious disease Dr. Sampson GoonFitzgerald. Her urine culture grew group B  strep. An aspirate of the fluid at L2-L3 I reviewed today. Her AFB Gram stain was negative, fungal culture is still in place. C + S is negative so far as of today. She is on vancomycin and ceftazidine dosed at dialysis for 6 weeks. She is not complaining of fever or back pain currently. We have been using [employer] and to her wounds 08/26/16; patient is still receiving IV antibiotics at dialysis. She does not feel well complaining of a lot of pain in the right hip radiating down her leg. She rubs the lateral aspect of the right hip over the greater trochanter. Her wound measurements were a lot of different this week although I don't think they were accurately recorded last week the wound on the right Achilles area looks about the same. Superficial without any depth. 09/09/16; patient is still receiving IV antibiotics at dialysis chide believe are vancomycin and ceftazidine. Since she was last here she is developed a new wound on the tip of her right great toe. The right Achilles wound had significant surface slough/necrotic material on arrival today Also of note the patient seems to be developing widespread myoclonic jerking which is gotten a lot worse over the last week to 2 although our intake nurse and the patient's daughter both state that she had had this at certain times in the past I had not specifically noticed this. Apparently at one point this was due to phosphorus abnormalities which were corrected at dialysis. I have reviewed her medication list I don't see any usual offenders except she is on Paxil Neurontin and Ultram although she takes the Ultram sparingly maybe once a week 09/15/16; still on vancomycin and ceftazidine as I understand things at dialysis. The area that was on the tip of her right great toe identified last week looks like an ischemic wound. She is known diabetic PAD and has had previous revascularization in 2017 by Dr. Wyn Quakerew. It is possible she may need to see him  again. I will probably attempt debridement of this next week we are using topical Santyl. In the meantime her heel wound has closed superiorly she is only left with a small wound at the inferior part of the Achilles area which was her deeper part of this wound at one point however even this area looks better 09/23/16; the patient has been  to see Dr. Sampson Goon and I gather because of the myoclonic jerking he is MIKHAILA, ROH ([id number]) changed one of her antibiotics probably the ceftazidine. Concerning today is the tip of her right great toe which seems to be an expanding necrotic area. She has known severe PAD and this looks like an ischemic wound to me. She has previously been revascularized by Dr.Dew in nearly part of 2017. We continue to make reasonable progression with the heel wound using Hydrofera Blue. We are using Prisma to the great toe on the right. 09/30/16; the patient looks a lot better today apparently her Rocephin was stopped also perhaps the vancomycin. She states she didn't get any antibiotics at dialysis. We have been using Hydrofera Blue to the heel area and Santyl to the right great toe. She has a follow-up with Dr. dew on 5/10with regards to the right toe wound which looks ischemic 10/07/16; the patient sees Dr. dew tomorrow however this is apparently about a dialysis access issue, I am hoping he will have a look at the right first toe. I'm thinking he will probably need to do another arterial study. The area on the right Achilles heel is for all intents and purposes resolved although there is still some foam herbal-looking areas under callus. We are using Hydrofera Blue to the Achilles and Santyl to the right first toe 10/14/16; repeat arteriogram booked for 5/31. right Achilles heel remains closed but in my mind vulnerable. tip of the right great toe unchanged Electronic Signature(s) Signed: 10/14/2016 4:43:34 PM By: Baltazar Najjar MD Entered By: Baltazar Najjar on  10/14/2016 09:22:02 [name] ([id number]) -------------------------------------------------------------------------------- Physical Exam Details Patient Name: [name] Date of Service: 10/14/2016 8:45 AM Medical Record Patient Account Number: [id number] Number: Treating RN: Phillis Haggis 1934/03/01 (82 y.o. Other Clinician: Date of Birth/Sex: Female) Treating Karista Aispuro Primary Care Provider: [name] Provider/Extender: G Referring Provider: Charolotte Capuchin in Treatment: 55 Eyes Conjunctivae clear. No discharge.Marland Kitchen Respiratory Respiratory effort is easy and symmetric bilaterally. Rate is normal at rest and on room air.. Cardiovascular femoral and popliteal pusles both palpable. faint DP pulse on the right. No PT. Lymphatic none palpable in pop or inguinal area. Notes Wound exam; the right Achilles heel is carefully explored. No open area. Lite surface eschar but removing this shows only normal skin Electronic Signature(s) Signed: 10/14/2016 4:43:34 PM By: Baltazar Najjar MD Entered By: Baltazar Najjar on 10/14/2016 09:24:54 [name] ([id number]) -------------------------------------------------------------------------------- Physician Orders Details Patient Name: [name] Date of Service: 10/14/2016 8:45 AM Medical Record Patient Account Number: [id number] Number: Treating RN: Phillis Haggis [birth year] (82 y.o. Other Clinician: Date of Birth/Sex: Female) Treating [place] Warriner Primary Care Provider: [name] Provider/Extender: G Referring Provider: Charolotte Capuchin in Treatment: 52 Verbal / Phone Orders: Yes Clinician: Ashok Cordia, Debi Read Back and Verified: Yes Diagnosis Coding Wound Cleansing Wound #20 Right Toe Great o Clean wound with Normal Saline. o Cleanse wound with mild soap and water o May shower with protection. Anesthetic Wound #20 Right Toe Great o Topical  Lidocaine 4% cream applied to wound bed prior to debridement - all wounds for clinic use only Primary Wound Dressing Wound #20 Right Toe Great o Santyl Ointment Secondary Dressing Wound #20 Right Toe Great o Dry Gauze o Kerlix and Coban o Foam Dressing Change Frequency Wound #20 Right Toe Great o Change dressing every other day. Follow-up Appointments Wound #20 Right Toe Great o Return Appointment in 1 week. Off-Loading Wound #20  Right Toe Great o Other: - Float heels ANGELL, PINCOCK ([id number]) Electronic Signature(s) Signed: 10/14/2016 4:43:34 PM By: Baltazar Najjar MD Signed: 10/14/2016 5:09:08 PM By: Alejandro Mulling Entered By: Alejandro Mulling on 10/14/2016 09:16:11 [name] ([id number]) -------------------------------------------------------------------------------- Problem List Details Patient Name: ISABELL, BONAFEDE. Date of Service: 10/14/2016 8:45 AM Medical Record Patient Account Number: [id number] Number: Treating RN: Phillis Haggis 1933-06-13 (82 y.o. Other Clinician: Date of Birth/Sex: Female) Treating Imogine Carvell Primary Care Provider: [name] Provider/Extender: G Referring Provider: Charolotte Capuchin in Treatment: 54 Active Problems ICD-10 Encounter Code Description Active Date Diagnosis E11.621 Type 2 diabetes mellitus with foot ulcer 12/17/2015 Yes L97.512 Non-pressure chronic ulcer of other part of right foot with 03/31/2016 Yes fat layer exposed E11.51 Type 2 diabetes mellitus with diabetic peripheral 12/17/2015 Yes angiopathy without gangrene Inactive Problems ICD-10 Code Description Active Date Inactive Date M86.671 Other chronic osteomyelitis, right ankle and foot 04/15/2016 04/15/2016 S31.104A Unspecified open wound of abdominal wall, left lower 12/17/2015 12/17/2015 quadrant without penetration into peritoneal cavity, initial encounter S31.103D Unspecified open wound of abdominal wall, right  lower 04/15/2016 04/15/2016 quadrant without penetration into peritoneal cavity, subsequent encounter Resolved Problems Electronic Signature(s) TEAGAN, OZAWA ([id number]) Signed: 10/14/2016 4:43:34 PM By: Baltazar Najjar MD Entered By: Baltazar Najjar on 10/14/2016 09:18:41 [name] ([id number]) -------------------------------------------------------------------------------- Progress Note Details Patient Name: [name] Date of Service: 10/14/2016 8:45 AM Medical Record Patient Account Number: [id number] Number: Treating RN: Phillis Haggis Nov 18, 1933 (82 y.o. Other Clinician: Date of Birth/Sex: Female) Treating Kenitra Leventhal Primary Care Provider: [name] Provider/Extender: G Referring Provider: Charolotte Capuchin in Treatment: 23 Subjective Chief Complaint Information obtained from Patient Ms. Vultaggio presents today for follow-up evaluation of her diabetic foot ulcers and abdominal wound. History of Present Illness (HPI) The following HPI elements were documented for the patient's wound: Location: right posterior heel, right Achilles, right lower quadrant abdomen, right fourth toe amp site Quality: denies pain to any wound Severity: not applicable Timing: denies pain 83 year old patient who is known to be diabetic, was referred to [place] by Dr. Gavin Potters for a right heel ulceration which she's had for a while. She was recently in hospital for a pneumonia and at that time and got delirious and was disoriented and sometime during this time developed a stage II ulcer on her right heel. Her past medical history is significant for bilateral pneumonia which was treated with injectable antibiotics and then to oral Levaquin which he has completed. She also has acute on chronic diastolic CHF, acute on chronic respiratory failure, end-stage renal disease on hemodialysis, atrial fibrillation, recent stroke, diabetes mellitus. The patient and her son are  poor historians but from what I understand she was admitted to the hospital with an acute vascular compromise of her right lower extremity and Dr. Wyn Quaker has done a surgical procedure and we are trying to obtain these notes. There are also some vascular workup done and we will try and obtain these notes. the injury to the left lower quadrant of abdomen and the suprapubic area have been there due to a bruise and have been there for several months and no intervention has been done. 10/11/2015 -- on review of the electronics records it was noted that the patient was admitted to the hospital on 09/14/2015 with peripheral vascular disease with claudication, end-stage renal disease, pressure ulcer, chronic atrial fibrillation. She was seen by Dr. Wyn Quaker who did her right lower extremity angiogram , angioplasty of the right anterior tibial  artery and thrombolysis with TPA of the right popliteal artery, and thrombectomy. She was seen by Dr. Wyn Quakerew during this past week and he was pleased with the progress. He did say that if he took her to the operating room for any procedure he would debride the abdominal wound under anesthesia. She was also seen by Dr. Ether GriffinsFowler the podiatrist who thought that she may lose her right fourth toe at some stage may need an amputation of this. Helane RimaHOPKINS, Elleni Y. ([id number]) 10/21/2015 --patient known to Dr. Wyn Quakerew and his last office visit from 10/04/2015 has been reviewed. 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 She also had a persistent ulceration of the right heel and markedly this area and a right third and fourth toe and a small scab on the calf but these are dry and seemed to be improving. Patient also has a left carotid endarterectomy and multiple interventions to a right brachiocephalic AV fistula. After the visit he had recommended noninvasive studies to  recheck her revascularization. He was off the impression that she would likely lose the right fourth toe and the third toe was likely to heal. He was concerned about underlying muscle necrosis on her right heel and midfoot. 11/01/2015 -- an echo done in January of this year showed her left ventricular ejection fraction to be about 50-55%. The patient was seen by the PA and Dr. Driscilla Grammesdew's office and the plan was to take her to the operating room soon to have a debridement under anesthesia for the abdominal wall wound, the Achilles tendon on the right leg and amputation of the right fourth toe. The daughter and the patient do not feel that they would be able to undergo hyperbaric oxygen therapy 5 days a week for 6 weeks. 12/17/15; this is a medically complex woman who I note was recently in this clinic however I was not involved with her care. She returns today with multiple wounds; a) she has a wound in the mid abdomen that is been there since March of this year. I note that she is been to the overall for debridement recently. The exact etiology of this wound is not really clear b) left lower quadrant abdominal wound had some sanguinous drainage when she came in here. The patient fell in January and thinks this may have been secondary to a hematoma. c): The patient has 3 wounds on her right leg including a small wound on the right mid calf, a large area over the Achilles which currently has a wound VAC for the last 6 weeks, also a smaller wound on the distal part of the right heel. As far as I understand most of these wounds are currently been dressed with's calcium alginate. According to her daughter the Achilles wound under the wound VAC is doing well d) the patient is had an amputation of her left fifth toe in January and the right fourth toe 6 weeks ago secondary to diabetic PAD e) the patient has chronic renal failure on dialysis for the last 2 years secondary to type 2 diabetes on insulin. The  daughter's knowledge there is been no biopsy of the abdominal wounds given their current appearance and lack of undefined etiology at have to wonder about calciphylaxis. 12/18/15:Addendum; I have reviewed [facility] I can see no relevant x-rays of the right heel. I note her arteriogram and revascularization of her right lower extremity in April 2017.  She had debridement of both abdominal wounds and the right heel and Achilles wound on 11/07/15 at which time she had a right fourth toe ray amputation. The abdominal wounds were debridement again on 6/29. I do not see any relevant pathology of these abdominal wounds 12/24/15; culture I did of the drainage from the midline abdominal wound last week showed both Proteus and ampicillin sensitive enterococcus. I've given her a course of Augmentin adjusted on dialysis days. She has no specific complaints today. Been using Santyl to the abdominal wounds in the right leg wound and the wound VAC on the right Achilles which was initially prescribed by Dr. dew 12/31/15; I have done two punch biopsies of the large midline abdominal. 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 May have been a trauma component of the one on the left lower quadrant however the midline wound had no such history. She has a large area on the right Achilles heel with a wound VAC prescribed by Dr. dew. A small wound on the right anterior leg.Marland Kitchen UNFORTUNATELY she has 2 new wounds today. One on the left heel which is probably a pressure area. As well her previous amputation site of her right fourth toe has dehisced and now has a small wound with significant depth at the amputation site. 01/14/2016 -- she returns after 2 weeks and had had a punch biopsy of abdominal wound done the last visit DELOIS, SILVESTER ([id number]) -- had a biopsy of her midline abdominal wound done and the Pathology diagnosis is that of ulceration, necrosis and inflammation and negative for dysplasia and malignancy. 01/21/16. 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 In the meantime she has new wounds of the left heel the right fourth toe amputation site is opened up. The back is stopped to the right heel area. 01/28/16; the abdominal wounds continued to improve. The extensive wound on her right Achilles also looks stable except for the lower aspect of the wound where there is a large liquefied area that probes right down to her calcaneus. This cultured Proteus last week I have her on Augmentin and doxycycline 1. I think this is going to need a course of IV antibiotics and I will call dialysis. X-ray I did last week was negative, I think she is going to need an MRI 02/04/16; right heel MRI as before Saturday. Receiving I believe IV Rocephin at dialysis 02/11/16; as it turns out the patient could not have a MRI as she has a bladder stimulator in place even though it is not currently in use since the beginning of this year. Although she has an allergy to IV contrast she apparently has done well with premedication so we will have to go for a CT scan with contrast. In the meantime she has had a fall now has a large skin tear on her left upper arm. She went to the ER and they suggested Tegaderm over topical antibiotics 03/03/16 currently patient returns after having been hospitalized for 2 weeks and subsequently transferred to [facility] nursing facility. She actually seems to be doing excellent compared to even when we last saw her according to our nursing staff. Both patient and her daughter are extremely encouraged at how well she is presenting at this point in time. Overall the biggest issue is still the right Achilles area which is being managed at this point in time by Dr. Wyn Quaker. Patient is currently utilizing a wound VAC to that region. 03/17/16;  patient is at [facility] nursing home still. Using Aquacel Ag to the wounds on her bilateral feet and still  Prisma to the small open area on her abdomen. 03/31/16 at this point in time patient has been tolerating the dressing changes currently. She fortunately has no worsening of her symptoms although she tells me that the nurse who is caring for her at [facility] nursing facility decided that nothing was needed in regard to the lower abdominal wound from a dressing standpoint at this time. she is really not having significant discomfort or pain at this point she continues to have some tunneling in the proximal Achilles wound region. 04/07/16 patient continues to do well on evaluation today. Even the Achilles wound which has been the most tender is not giving her as much trouble. Unfortunately the PolyMem dressings that we utilize last week really did not seem to benefit her in particular. Obviously we will discontinue that at this point in time today. 04-15-16:Ms. Traxler is accompanied today by her daughter. She is still residing in an SNF undergoing dialysis, continues to receive antibiotics during dialysis as prescribed by Dr. Sampson Goon of infectious disease. She has a follow-up appointment with Dr. Sampson Goon on 04-17-16 to discuss the continuation of these antibiotics. Ms. Fullam denies any pain to any of the 3 remaining wounds she does admit to changing to [employer] surgical shoes for safety while ambulating with physical therapy. She denies any falls since her last appointment here although she and her daughter do admit to increased tremors of unclear etiology since her last appointment. She has tolerated the dressing changes that were prescribed last week. 04-22-16 Ms. Valliant presents today accompanied by her daughter for evaluation of her diabetic foot ulcers. She is still residing in an SNF, she continues dialysis 3 times weekly. She'll follow up with Dr. Sampson Goon last Friday, and at that appointment IV antibiotics were discontinued. According to Ms. Kretzschmar and her daughter if there is any  regression of her wounds Dr. Sampson Goon will consider re-starting the antibiotics. Ms. Rosano daughter states that since the discontinuation of antibiotic therapy her "twitches" have resolved. 04/29/16; I have not seen this patient in quite some time however she is completed triple antibiotic therapy given at dialysis for osteomyelitis as prescribed by infectious disease. She is still being followed by Dr. Wyn Quaker [name] ([id number]) of vascular surgery. We are using Hydrofera Blue to the surface of these wounds. She currently has 2 open areas a substantial area over her Achilles area although this is a lot better than the last time I saw this. She also has a small wound superiorly from this wound which has a superior probing depth of 2 cm. 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 My biopsy did not prove this nevertheless I think this is the correct clinical diagnosis. We are now following her for an area on the right Achilles part of her heel. This is not any different from last week. She also has a small tunneling area just above this. And unfortunately this week there is been a reopening of an area where her right fourth toe was previously amputated. She is completed antibiotics 05/13/16; she has a new reopening on the mid abdominal wound in the same site is previously. This is a small open area. Apligraf #  1 today to the areas on the right heel o2 still an open area in the fourth toe amputation site 05/27/16; the areas on her abdomen are completely closed over. Small open area from last time is closed. Apligraf #2 today to the areas on the right heel. The 4th toe amputation site is also heel 06/10/16; we did not have an Apligraf to put on today. This appeared  tunneling wound on the right posterior calf appears to be closed. The more substantial area on her Achilles itself is improved. 06/24/16; I reapplied her third Apligraf today although we have not had one debridement last time. The original tunneling wound is not as closed as last time. The more substantial area on her Achilles itself is improved with advancing epithelialization 07/08/16 Apligraf #4 today. The original long area over her Achilles as a healthy-looking area at the superior aspect and a small divot inferiorly. Both of the wound surface is look healthy 07/22/16; patient arrived today with a 2 original wounds on the Achilles part of her ankle too small for another application of Apligraf. This is on the right Achilles area. The small divot inferiorly looks as though it may have skin over most of this and I can't really see an open area. She did have an erythematous area over the medial malleolus with some drainage this looks like a rapid injury not cellulitis 07/29/16; erythematous area over the medial malleolus seems a lot better this week. This was a wrap injury. The areas on the right Achilles is still a small divot. The area superiorly down somewhat in size. 08/05/16; condition is generally not feeling well. No major changed either wound. Using Prisma 08/19/16; patient was last here she was admitted to hospital last week. Apparently she was complaining mostly of abdominal pain however a CT scan of the abdomen and pelvis revealed changes in the L2-L3 interspace suspicious for discitis and osteomyelitis as well as changes in the adjacent paraspinal and psoas muscles. She was seen by infectious disease Dr. Sampson Goon. Her urine culture grew group B strep. An aspirate of the fluid at L2-L3 I reviewed today. Her AFB Gram stain was negative, fungal culture is still in place. C + S is negative so far as of today. She is on vancomycin and ceftazidine dosed at dialysis for 6 weeks. She is not  complaining of fever or back pain currently. We have been using [employer] and to her wounds 08/26/16; patient is still receiving IV antibiotics at dialysis. She does not feel well complaining of a lot of pain in the right hip radiating down her leg. She rubs the lateral aspect of the right hip over the greater trochanter. Her wound measurements were a lot of different this week although I don't think they were accurately recorded last week the wound on the right Achilles area looks about the same. Superficial without any depth. 09/09/16; patient is still receiving IV antibiotics at dialysis chide believe are vancomycin and ceftazidine. Since she was last here she is developed a new wound on the tip of her right great toe. The right Achilles wound had significant surface slough/necrotic material on arrival today Also of note the patient seems to be developing widespread myoclonic jerking which is gotten a lot worse over the last week to 2 although our intake nurse and the patient's daughter both state that she had had this at certain times in the past I had not specifically noticed this. Apparently at one point this was due to phosphorus  abnormalities which were corrected at dialysis. I have reviewed her medication list I don't see any usual offenders except she is on Paxil Neurontin and Ultram although she takes the Ultram sparingly SHATANA, SAXTON ([id number]) maybe once a week 09/15/16; still on vancomycin and ceftazidine as I understand things at dialysis. The area that was on the tip of her right great toe identified last week looks like an ischemic wound. She is known diabetic PAD and has had previous revascularization in 2017 by Dr. Wyn Quaker. It is possible she may need to see him again. I will probably attempt debridement of this next week we are using topical Santyl. In the meantime her heel wound has closed superiorly she is only left with a small wound at the inferior part of the  Achilles area which was her deeper part of this wound at one point however even this area looks better 09/23/16; the patient has been to see Dr. Sampson Goon and I gather because of the myoclonic jerking he is changed one of her antibiotics probably the ceftazidine. Concerning today is the tip of her right great toe which seems to be an expanding necrotic area. She has known severe PAD and this looks like an ischemic wound to me. She has previously been revascularized by Dr.Dew in nearly part of 2017. We continue to make reasonable progression with the heel wound using Hydrofera Blue. We are using Prisma to the great toe on the right. 09/30/16; the patient looks a lot better today apparently her Rocephin was stopped also perhaps the vancomycin. She states she didn't get any antibiotics at dialysis. We have been using Hydrofera Blue to the heel area and Santyl to the right great toe. She has a follow-up with Dr. dew on 5/10with regards to the right toe wound which looks ischemic 10/07/16; the patient sees Dr. dew tomorrow however this is apparently about a dialysis access issue, I am hoping he will have a look at the right first toe. I'm thinking he will probably need to do another arterial study. The area on the right Achilles heel is for all intents and purposes resolved although there is still some foam herbal-looking areas under callus. We are using Hydrofera Blue to the Achilles and Santyl to the right first toe 10/14/16; repeat arteriogram booked for 5/31. right Achilles heel remains closed but in my mind vulnerable. tip of the right great toe unchanged Objective Constitutional Vitals Time Taken: 8:57 AM, Height: 65 in, Weight: 160 lbs, BMI: 26.6, Pulse: 78 bpm, Respiratory Rate: 16 breaths/min, Blood Pressure: 113/52 mmHg. Eyes Conjunctivae clear. No discharge.Marland Kitchen Respiratory Respiratory effort is easy and symmetric bilaterally. Rate is normal at rest and on room air.. Cardiovascular femoral  and popliteal pusles both palpable. faint DP pulse on the right. No PT. Lymphatic none palpable in pop or inguinal area. AMARISS, DETAMORE ([id number]) General Notes: Wound exam; the right Achilles heel is carefully explored. No open area. Lite surface eschar but removing this shows only normal skin Integumentary (Hair, Skin) Wound #20 status is Open. Original cause of wound was Gradually Appeared. The wound is located on the Right Toe Great. The wound measures 1.4cm length x 1.2cm width x 0.1cm depth; 1.319cm^2 area and 0.132cm^3 volume. There is no tunneling or undermining noted. There is a large amount of serous drainage noted. The wound margin is distinct with the outline attached to the wound base. There is no granulation within the wound bed. There is a large (67-100%) amount of necrotic tissue within the  wound bed including Eschar and Adherent Slough. Periwound temperature was noted as No Abnormality. The periwound has tenderness on palpation. Wound #9 status is Open. Original cause of wound was Gradually Appeared. The wound is located on the Right Achilles. The wound measures 0cm length x 0cm width x 0cm depth; 0cm^2 area and 0cm^3 volume. There is no tunneling or undermining noted. There is a none present amount of drainage noted. The wound margin is distinct with the outline attached to the wound base. There is no granulation within the wound bed. There is no necrotic tissue within the wound bed. Periwound temperature was noted as No Abnormality. Assessment Active Problems ICD-10 E11.621 - Type 2 diabetes mellitus with foot ulcer L97.512 - Non-pressure chronic ulcer of other part of right foot with fat layer exposed E11.51 - Type 2 diabetes mellitus with diabetic peripheral angiopathy without gangrene Plan Wound Cleansing: Wound #20 Right Toe Great: Clean wound with Normal Saline. Cleanse wound with mild soap and water May shower with protection. Anesthetic: Wound #20 Right  Toe Great: Topical Lidocaine 4% cream applied to wound bed prior to debridement - all wounds for clinic use only Primary Wound Dressing: Wound #20 Right Toe Great: Santyl Ointment Secondary Dressing: Wound #20 Right Toe GreatSUNG, RENTON ([id number]) Dry Gauze Kerlix and Coban Foam Dressing Change Frequency: Wound #20 Right Toe Great: Change dressing every other day. Follow-up Appointments: Wound #20 Right Toe Great: Return Appointment in 1 week. Off-Loading: Wound #20 Right Toe Great: Other: - Float heels 1 foam to the achilles area 2 still santyl to the right great toe that has a non viable surface. No thought about further debridement until after revasculzarization attempt Electronic Signature(s) Signed: 10/14/2016 4:43:34 PM By: Baltazar Najjar MD Entered By: Baltazar Najjar on 10/14/2016 09:26:12 [name] ([id number]) -------------------------------------------------------------------------------- SuperBill Details Patient Name: [name] Date of Service: 10/14/2016 Medical Record Patient Account Number: [id number] Number: Treating RN: Phillis Haggis 20-Sep-1933 (82 y.o. Other Clinician: Date of Birth/Sex: Female) Treating Oluwademilade Kellett Primary Care Provider: [name] Provider/Extender: G Referring Provider: Charolotte Capuchin in Treatment: 43 Diagnosis Coding ICD-10 Codes Code Description E11.621 Type 2 diabetes mellitus with foot ulcer L97.512 Non-pressure chronic ulcer of other part of right foot with fat layer exposed E11.51 Type 2 diabetes mellitus with diabetic peripheral angiopathy without gangrene Facility Procedures CPT4 Code: [id number] Description: 99213 - WOUND CARE VISIT-LEV 3 EST PT Modifier: Quantity: 1 Physician Procedures CPT4 Code Description: [id number] 99213 - WC PHYS LEVEL 3 - EST PT ICD-10 Description Diagnosis E11.621 Type 2 diabetes mellitus with foot ulcer L97.512 Non-pressure chronic ulcer of other  part of right fo Modifier: ot with fat lay Quantity: 1 er exposed Electronic Signature(s) Signed: 10/14/2016 4:43:34 PM By: Baltazar Najjar MD Signed: 10/14/2016 5:09:08 PM By: Alejandro Mulling Entered By: Alejandro Mulling on 10/14/2016 10:06:32

## 2016-10-16 NOTE — Progress Notes (Signed)
[name], [name] ([id number]) Visit Report for 10/14/2016 Arrival Information Details Patient Name: [name], [name]. Date of Service: 10/14/2016 8:45 AM Medical Record Patient Account Number: [id number] Number: Treating RN: Phillis Haggis [birth year] (80 [name] Other Clinician: Date of Birth/Sex: Female) Treating ROBSON, MICHAEL Primary Care [place]: [name] Layni Kreamer/Extender: G Referring Shawndell Schillaci: Charolotte Capuchin in Treatment: 53 Visit Information History Since Last Visit All ordered tests and consults were completed: No Patient Arrived: Wheel Chair Added or deleted any medications: No Arrival Time: 08:54 Any new allergies or adverse reactions: No Accompanied By: daughter Had a fall or experienced change in No Transfer Assistance: EasyPivot Patient activities of daily living that may affect Lift risk of falls: Patient Identification Verified: Yes Signs or symptoms of abuse/neglect since last No Secondary Verification Process Yes visito Completed: Hospitalized since last visit: No Patient Requires Transmission- No Has Dressing in Place as Prescribed: Yes Based Precautions: Pain Present Now: No Patient Has Alerts: Yes Patient Alerts: Patient on Blood Thinner DMII Warfarin ABI [place] Bilateral NO BP RIGHT ARM Dialysis Pt Electronic Signature(s) Signed: 10/14/2016 5:09:08 PM By: Alejandro Mulling Entered By: Alejandro Mulling on 10/14/2016 08:57:05 [name] ([id number]) -------------------------------------------------------------------------------- Clinic Level of Care Assessment Details Patient Name: [name]. Date of Service: 10/14/2016 8:45 AM Medical Record Patient Account Number: [id number] Number: Treating RN: Phillis Haggis [birth year] (80 [name] Other Clinician: Date of Birth/Sex: Female) Treating ROBSON, MICHAEL Primary Care [place] Souter: [name] Geraldy Akridge/Extender: G Referring Schneider Warchol: Charolotte Capuchin in  Treatment: 58 Clinic Level of Care Assessment Items TOOL 4 Quantity Score X - Use when only an EandM is performed on FOLLOW-UP visit 1 0 ASSESSMENTS - Nursing Assessment / Reassessment X - Reassessment of Co-morbidities (includes updates in patient status) 1 10 X - Reassessment of Adherence to Treatment Plan 1 5 ASSESSMENTS - Wound and Skin Assessment / Reassessment []  - Simple Wound Assessment / Reassessment - one wound 0 X - Complex Wound Assessment / Reassessment - multiple wounds 2 5 []  - Dermatologic / Skin Assessment (not related to wound area) 0 ASSESSMENTS - Focused Assessment []  - Circumferential Edema Measurements - multi extremities 0 []  - Nutritional Assessment / Counseling / Intervention 0 []  - Lower Extremity Assessment (monofilament, tuning fork, pulses) 0 []  - Peripheral Arterial Disease Assessment (using hand held doppler) 0 ASSESSMENTS - Ostomy and/or Continence Assessment and Care []  - Incontinence Assessment and Management 0 []  - Ostomy Care Assessment and Management (repouching, etc.) 0 PROCESS - Coordination of Care []  - Simple Patient / Family Education for ongoing care 0 X - Complex (extensive) Patient / Family Education for ongoing care 1 20 X - Staff obtains Chiropractor, Records, Test Results / Process Orders 1 10 []  - Staff telephones HHA, Nursing Homes / Clarify orders / etc 0 [name], [name] ([id number]) []  - Routine Transfer to another Facility (non-emergent condition) 0 []  - Routine Hospital Admission (non-emergent condition) 0 []  - New Admissions / Manufacturing engineer / Ordering NPWT, Apligraf, etc. 0 []  - Emergency Hospital Admission (emergent condition) 0 X - Simple Discharge Coordination 1 10 []  - Complex (extensive) Discharge Coordination 0 PROCESS - Special Needs []  - Pediatric / Minor Patient Management 0 []  - Isolation Patient Management 0 []  - Hearing / Language / Visual special needs 0 []  - Assessment of Community assistance  (transportation, D/C planning, etc.) 0 []  - Additional assistance / Altered mentation 0 []  - Support Surface(s) Assessment (bed, cushion, seat, etc.) 0 INTERVENTIONS - Wound Cleansing / Measurement X -  Simple Wound Cleansing - one wound 1 5 []  - Complex Wound Cleansing - multiple wounds 0 X - Wound Imaging (photographs - any number of wounds) 1 5 []  - Wound Tracing (instead of photographs) 0 X - Simple Wound Measurement - one wound 1 5 []  - Complex Wound Measurement - multiple wounds 0 INTERVENTIONS - Wound Dressings X - Small Wound Dressing one or multiple wounds 1 10 []  - Medium Wound Dressing one or multiple wounds 0 []  - Large Wound Dressing one or multiple wounds 0 X - Application of Medications - topical 1 5 []  - Application of Medications - injection 0 [name], [name] ([id number]) INTERVENTIONS - Miscellaneous []  - External ear exam 0 []  - Specimen Collection (cultures, biopsies, blood, body fluids, etc.) 0 []  - Specimen(s) / Culture(s) sent or taken to Lab for analysis 0 []  - Patient Transfer (multiple staff / Michiel Sites Lift / Similar devices) 0 []  - Simple Staple / Suture removal (25 or less) 0 []  - Complex Staple / Suture removal (26 or more) 0 []  - Hypo / Hyperglycemic Management (close monitor of Blood Glucose) 0 []  - Ankle / Brachial Index (ABI) - do not check if billed separately 0 X - Vital Signs 1 5 Has the patient been seen at the hospital within the last three years: Yes Total Score: 100 Level Of Care: New/Established - Level 3 Electronic Signature(s) Signed: 10/14/2016 5:09:08 PM By: Alejandro Mulling Entered By: Alejandro Mulling on 10/14/2016 10:06:20 [name] ([id number]) -------------------------------------------------------------------------------- Encounter Discharge Information Details Patient Name: [name]. Date of Service: 10/14/2016 8:45 AM Medical Record Patient Account Number: [id number] Number: Treating RN: Phillis Haggis 06-15-1933 (80 [name] Other Clinician: Date of Birth/Sex: Female) Treating ROBSON, MICHAEL Primary Care Kris Burd: [name] Inita Uram/Extender: G Referring Kellar Westberg: Charolotte Capuchin in Treatment: 34 Encounter Discharge Information Items Discharge Pain Level: 0 Discharge Condition: Stable Ambulatory Status: Wheelchair Discharge Destination: Home Transportation: Private Auto Accompanied By: daughter Schedule Follow-up Appointment: Yes Medication Reconciliation completed and provided to Patient/Care No Ripley Bogosian: Provided on Clinical Summary of Care: 10/14/2016 Form Type Recipient Paper Patient [facility] Electronic Signature(s) Signed: 10/14/2016 9:27:52 AM By: Gwenlyn Perking Entered By: Gwenlyn Perking on 10/14/2016 09:27:52 [name] ([id number]) -------------------------------------------------------------------------------- Lower Extremity Assessment Details Patient Name: [name] Date of Service: 10/14/2016 8:45 AM Medical Record Patient Account Number: [id number] Number: Treating RN: Phillis Haggis 1934/03/08 (82 [name] Other Clinician: Date of Birth/Sex: Female) Treating ROBSON, MICHAEL Primary Care Siddh Vandeventer: [name] Shanigua Gibb/Extender: G Referring Ismael Treptow: Charolotte Capuchin in Treatment: 29 Vascular Assessment Pulses: Dorsalis Pedis Palpable: [Right:Yes] Posterior Tibial Extremity colors, hair growth, and conditions: Extremity Color: [Right:Hyperpigmented] Temperature of Extremity: [Right:Warm] Capillary Refill: [Right:< 3 seconds] Electronic Signature(s) Signed: 10/14/2016 5:09:08 PM By: Alejandro Mulling Entered By: Alejandro Mulling on 10/14/2016 09:14:46 [name] ([id number]) -------------------------------------------------------------------------------- Multi Wound Chart Details Patient Name: [name] Date of Service: 10/14/2016 8:45 AM Medical Record Patient Account Number:  [id number] Number: Treating RN: Phillis Haggis January 26, 1934 (82 [name] 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00%] [N/A:N/A] % Reduction in Volume: -131.60% [9:100.00%] [N/A:N/A] Classification: [20:Partial Thickness] [9:Grade 2] [N/A:N/A] HBO Classification: [20:Grade 1] [9:N/A] [N/A:N/A] Exudate Amount: [20:Large] [9:None Present] [N/A:N/A] Exudate Type: [20:Serous] [9:N/A] [N/A:N/A] Exudate Color: [20:amber] [9:N/A] [N/A:N/A] Wound Margin: [20:Distinct, outline attached] [9:Distinct, outline attached] [N/A:N/A] Granulation Amount: [20:None Present (0%)] [9:None Present (0%)] [N/A:N/A] Necrotic Amount: Large (67-100%) None Present (0%) N/A Necrotic Tissue: Eschar, Adherent Slough N/A  N/A Epithelialization: None Large (67-100%) N/A Periwound Skin Texture: No Abnormalities Noted No Abnormalities Noted N/A Periwound Skin No Abnormalities Noted No Abnormalities Noted N/A Moisture: Periwound Skin Color: No Abnormalities Noted No Abnormalities Noted N/A Temperature: No Abnormality No Abnormality N/A Tenderness on Yes No N/A Palpation: Wound Preparation: Ulcer Cleansing: Ulcer Cleansing: N/A Rinsed/Irrigated with Rinsed/Irrigated with Saline Saline Topical Anesthetic Topical Anesthetic Applied: Other: lidocaine Applied: None 4% Treatment Notes Wound #20 (Right Toe Great) 1. Cleansed with: Clean wound with Normal Saline 2. Anesthetic Topical Lidocaine 4% cream to wound bed prior to debridement 4. Dressing Applied: Santyl Ointment 5. Secondary Dressing Applied Dry Gauze Kerlix/Conform 7. Secured with Secretary/administrator) Signed: 10/14/2016 4:43:34 PM By: Baltazar Najjar MD Entered By: Baltazar Najjar on 10/14/2016 09:18:49 [name] ([id number]) -------------------------------------------------------------------------------- Multi-Disciplinary Care Plan Details Patient Name: [name], [name]. Date of Service: 10/14/2016 8:45 AM Medical Record Patient Account Number: [id number] Number: Treating RN: Phillis Haggis [birth year] (80 [name] Other Clinician: Date of Birth/Sex: Female) Treating ROBSON, MICHAEL Primary Care Renwick Asman: [name] Adonte Vanriper/Extender: G Referring Elbia Paro: Charolotte Capuchin in Treatment: 31 Active Inactive ` Abuse / Safety / Falls / Self Care Management Nursing Diagnoses: Impaired physical mobility Potential for falls Goals: Patient will remain injury free Date Initiated: 12/17/2015 Target Resolution Date: 08/29/2016 Goal Status: Active Interventions: Assess fall risk on admission and as needed Notes: ` Necrotic Tissue Nursing Diagnoses: Impaired tissue integrity related to necrotic/devitalized  tissue Knowledge deficit related to management of necrotic/devitalized tissue Goals: Necrotic/devitalized tissue will be minimized in the wound bed Date Initiated: 02/04/2016 Target Resolution Date: 08/29/2016 Goal Status: Active Interventions: Assess patient pain level pre-, during and post procedure and prior to discharge Provide education on necrotic tissue and debridement process Treatment Activities: Apply topical anesthetic as ordered : 02/04/2016 [name], [name] ([id number]) Notes: ` Wound/Skin Impairment Nursing Diagnoses: Impaired tissue integrity Goals: Patient/caregiver will verbalize understanding of skin care regimen Date Initiated: 12/17/2015 Target Resolution Date: 08/29/2016 Goal Status: Active Ulcer/skin breakdown will have a volume reduction of 30% by week 4 Date Initiated: 12/17/2015 Target Resolution Date: 08/29/2016 Goal Status: Active Ulcer/skin breakdown will have a volume reduction of 50% by week 8 Date Initiated: 12/17/2015 Target Resolution Date: 08/29/2016 Goal Status: Active Ulcer/skin breakdown will have a volume reduction of 80% by week 12 Date Initiated: 12/17/2015 Target Resolution Date: 08/29/2016 Goal Status: Active Ulcer/skin breakdown will heal within 14 weeks Date Initiated: 12/17/2015 Target Resolution Date: 08/29/2016 Goal Status: Active Interventions: Assess patient/caregiver ability to obtain necessary supplies Assess patient/caregiver ability to perform ulcer/skin care regimen upon admission and as needed Assess ulceration(s) every visit Notes: Electronic Signature(s) Signed: 10/14/2016 5:09:08 PM By: Alejandro Mulling Entered By: Alejandro Mulling  on 10/14/2016 09:08:56 Natalie RimaHOPKINS, Natalie Y. ([id number]) -------------------------------------------------------------------------------- Pain Assessment Details Patient Name: Natalie RimaHOPKINS, Natalie Y. Date of Service: 10/14/2016 8:45 AM Medical Record Patient Account Number:  [id number] Number: Treating RN: Phillis Haggisinkerton, Debi [birth year] (80 [name] Other Clinician: Date of Birth/Sex: Female) Treating ROBSON, MICHAEL Primary Care Gracelin Weisberg: Rolm GalaGrandis, Heidi Jacole Capley/Extender: G Referring Kalup Jaquith: Charolotte CapuchinGrandis, Heidi Weeks in Treatment: 5843 Active Problems Location of Pain Severity and Description of Pain Patient Has Paino No Site Locations With Dressing Change: No Pain Management and Medication Current Pain Management: Electronic Signature(s) Signed: 10/14/2016 5:09:08 PM By: Alejandro MullingPinkerton, Debra Entered By: Alejandro MullingPinkerton, Debra on 10/14/2016 08:57:17 Natalie RimaHOPKINS, Natalie Y. ([id number]) -------------------------------------------------------------------------------- Patient/Caregiver Education Details Patient Name: Natalie RimaHOPKINS, Natalie Y. Date of Service: 10/14/2016 8:45 AM Medical Record Patient Account Number: [id number] Number: Treating RN: Phillis Haggisinkerton, Debi [birth year] (80 [name] Other Clinician: Date of Birth/Gender: Female) Treating ROBSON, MICHAEL Primary Care Physician: Rolm GalaGrandis, Heidi Physician/Extender: G Referring Physician: Charolotte CapuchinGrandis, Heidi Weeks in Treatment: 2443 Education Assessment Education Provided To: Patient Education Topics Provided Wound/Skin Impairment: Handouts: Other: change dressing as ordered Methods: Demonstration, Explain/Verbal Responses: State content correctly Electronic Signature(s) Signed: 10/14/2016 5:09:08 PM By: Alejandro MullingPinkerton, Debra Entered By: Alejandro MullingPinkerton, Debra on 10/14/2016 09:09:49 Natalie RimaHOPKINS, Natalie Y. ([id number]) -------------------------------------------------------------------------------- Wound Assessment Details Patient Name: Natalie RimaHOPKINS, Natalie Y. Date of Service: 10/14/2016 8:45 AM Medical Record Patient Account Number: [id number] Number: Treating RN: Phillis Haggisinkerton, Debi [birth year] (80 [name] Other Clinician: Date of Birth/Sex: Female) Treating ROBSON, MICHAEL Primary Care Katalina Magri: Rolm GalaGrandis, Heidi Janel Beane/Extender:  G Referring Thos Matsumoto: Charolotte CapuchinGrandis, Heidi Weeks in Treatment: 5143 Wound Status Wound Number: 20 Primary Atypical Etiology: Wound Location: Right Toe Great Wound Open Wounding Event: Gradually Appeared Status: Date Acquired: 09/09/2016 Comorbid Arrhythmia, Congestive Heart Failure, Weeks Of Treatment: 5 History: Hypertension, Type II Diabetes Clustered Wound: No Photos Photo Uploaded By: Alejandro MullingPinkerton, Debra on 10/14/2016 16:10:14 Wound Measurements Length: (cm) 1.4 Width: (cm) 1.2 Depth: (cm) 0.1 Area: (cm) 1.319 Volume: (cm) 0.132 % Reduction in Area: -133.5% % Reduction in Volume: -131.6% Epithelialization: None Tunneling: No Undermining: No Wound Description Classification: Partial Thickness Diabetic Severity (Wagner): Grade 1 Wound Margin: Distinct, outline attach Exudate Amount: Large Exudate Type: Serous Exudate Color: amber Foul Odor After Cleansing: No Slough/Fibrino No ed Wound Bed Granulation Amount: None Present (0%) Necrotic Amount: Large (67-100%) Natalie RimaHOPKINS, Evianna Y. ([id number]) Necrotic Quality: Eschar, Adherent Slough Periwound Skin Texture Texture Color No Abnormalities Noted: No No Abnormalities Noted: No Moisture Temperature / Pain No Abnormalities Noted: No Temperature: No Abnormality Tenderness on Palpation: Yes Wound Preparation Ulcer Cleansing: Rinsed/Irrigated with Saline Topical Anesthetic Applied: Other: lidocaine 4%, Treatment Notes Wound #20 (Right Toe Great) 1. Cleansed with: Clean wound with Normal Saline 2. Anesthetic Topical Lidocaine 4% cream to wound bed prior to debridement 4. Dressing Applied: Santyl Ointment 5. Secondary Dressing Applied Dry Gauze Kerlix/Conform 7. Secured with Secretary/administratorTape Electronic Signature(s) Signed: 10/14/2016 5:09:08 PM By: Alejandro MullingPinkerton, Debra Entered By: Alejandro MullingPinkerton, Debra on 10/14/2016 09:06:13 Natalie RimaHOPKINS, Jaeleah Y.  ([id number]) -------------------------------------------------------------------------------- Wound Assessment Details Patient Name: Natalie RimaHOPKINS, Nelida Y. Date of Service: 10/14/2016 8:45 AM Medical Record Patient Account Number: [id number] Number: Treating RN: Phillis Haggisinkerton, Debi [birth year] (80 [name] Other Clinician: Date of Birth/Sex: Female) Treating ROBSON, MICHAEL Primary Care Shine Mikes: Rolm GalaGrandis, Heidi Thurman Sarver/Extender: G Referring Cyan Clippinger: Charolotte CapuchinGrandis, Heidi Weeks in Treatment: 43 Wound Status Wound Number: 9 Primary Diabetic Wound/Ulcer of the Lower Etiology: Extremity Wound Location: Right Achilles Secondary Arterial Insufficiency Ulcer Wounding Event: Gradually Appeared Etiology: Date Acquired: 06/02/2015 Wound Open Weeks Of Treatment: 43 Status: Clustered Wound: No Comorbid Arrhythmia, Congestive Heart Pending Amputation  On Presentation History: Failure, Hypertension, Type II Diabetes Photos Photo Uploaded By: Alejandro Mulling on 10/14/2016 16:11:13 Wound Measurements Length: (cm) 0 % Reductio Width: (cm) 0 % Reductio Depth: (cm) 0 Epithelial Area: (cm) 0 Tunneling Volume: (cm) 0 Undermini n in Area: 100% n in Volume: 100% ization: Large (67-100%) : No ng: No Wound Description Classification: Grade 2 Wound Margin: Distinct, outline attached Exudate Amount: None Present Foul Odor After Cleansing: No Slough/Fibrino No Wound Bed Granulation Amount: None Present (0%) Necrotic Amount: None Present (0%) ARADHANA, GIN ([id number]) Periwound Skin Texture Texture Color No Abnormalities Noted: No No Abnormalities Noted: No Moisture Temperature / Pain No Abnormalities Noted: No Temperature: No Abnormality Wound Preparation Ulcer Cleansing: Rinsed/Irrigated with Saline Topical Anesthetic Applied: None Electronic Signature(s) Signed: 10/14/2016 5:09:08 PM By: Alejandro Mulling Entered By: Alejandro Mulling on 10/14/2016 09:14:32 [name]  ([id number]) -------------------------------------------------------------------------------- Vitals Details Patient Name: [name] Date of Service: 10/14/2016 8:45 AM Medical Record Patient Account Number: [id number] Number: Treating RN: Phillis Haggis 25-Mar-1934 (80 [name] Other Clinician: Date of Birth/Sex: Female) Treating ROBSON, MICHAEL Primary Care Wreatha Sturgeon: [name] Donovon Micheletti/Extender: G Referring Tegan Britain: Charolotte Capuchin in Treatment: 82 Vital Signs Time Taken: 08:57 Pulse (bpm): 78 Height (in): 65 Respiratory Rate (breaths/min): 16 Weight (lbs): 160 Blood Pressure (mmHg): 113/52 Body Mass Index (BMI): 26.6 Reference Range: 80 - 120 mg / dl Electronic Signature(s) Signed: 10/14/2016 5:09:08 PM By: Alejandro Mulling Entered By: Alejandro Mulling on 10/14/2016 08:59:59

## 2016-10-21 ENCOUNTER — Encounter: Payer: Medicare Other | Admitting: Internal Medicine

## 2016-10-21 DIAGNOSIS — E11621 Type 2 diabetes mellitus with foot ulcer: Secondary | ICD-10-CM | POA: Diagnosis not present

## 2016-10-23 NOTE — Progress Notes (Signed)
[name], [name] ([id number]) Visit Report for 10/21/2016 Chief Complaint Document Details Patient Name: [name], [name]. Date of Service: 10/21/2016 8:00 AM Medical Record Patient Account Number: [id number] Number: Treating RN: Phillis Haggis March 14, 1934 (82 y.o. Other Clinician: Date of Birth/Sex: Female) Treating Zaya Kessenich Primary Care Provider: [name] Provider/Extender: G Referring Provider: Charolotte Capuchin in Treatment: 33 Information Obtained from: Patient Chief Complaint Ms. Lough presents today for follow-up evaluation of her diabetic foot ulcers and abdominal wound. Electronic Signature(s) Signed: 10/21/2016 5:34:29 PM By: Baltazar Najjar MD Entered By: Baltazar Najjar on 10/21/2016 08:30:32 [name] ([id number]) -------------------------------------------------------------------------------- HPI Details Patient Name: Natalie, [name]. Date of Service: 10/21/2016 8:00 AM Medical Record Patient Account Number: [id number] Number: Treating RN: Phillis Haggis 10/30/1933 (83 y.o. Other Clinician: Date of Birth/Sex: Female) Treating Ludger Bones Primary Care Provider: [name] Provider/Extender: G Referring Provider: Charolotte Capuchin in Treatment: 90 History of Present Illness Location: right posterior heel, right Achilles, right lower quadrant abdomen, right fourth toe amp site Quality: denies pain to any wound Severity: not applicable Timing: denies pain HPI Description: 83 year old patient who is known to be diabetic, was referred to [place] by Dr. Gavin Potters for a right heel ulceration which she's had for a while. She was recently in hospital for a pneumonia and at that time and got delirious and was disoriented and sometime during this time developed a stage II ulcer on her right heel. Her past medical history is significant for bilateral pneumonia which was treated with injectable antibiotics and then to oral  Levaquin which he has completed. She also has acute on chronic diastolic CHF, acute on chronic respiratory failure, end-stage renal disease on hemodialysis, atrial fibrillation, recent stroke, diabetes mellitus. The patient and her son are poor historians but from what I understand she was admitted to the hospital with an acute vascular compromise of her right lower extremity and Dr. Wyn Quaker has done a surgical procedure and we are trying to obtain these notes. There are also some vascular workup done and we will try and obtain these notes. the injury to the left lower quadrant of abdomen and the suprapubic area have been there due to a bruise and have been there for several months and no intervention has been done. 10/11/2015 -- on review of the electronics records it was noted that the patient was admitted to the hospital on 09/14/2015 with peripheral vascular disease with claudication, end-stage renal disease, pressure ulcer, chronic atrial fibrillation. She was seen by Dr. Wyn Quaker who did her right lower extremity angiogram , angioplasty of the right anterior tibial artery and thrombolysis with TPA of the right popliteal artery, and thrombectomy. She was seen by Dr. Wyn Quaker during this past week and he was pleased with the progress. He did say that if he took her to the operating room for any procedure he would debride the abdominal wound under anesthesia. She was also seen by Dr. Ether Griffins the podiatrist who thought that she may lose her right fourth toe at some stage may need an amputation of this. 10/21/2015 --patient known to Dr. Wyn Quaker and his last office visit from 10/04/2015 has been reviewed. 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 She also had a persistent ulceration of the right heel and markedly this area and a right third and fourth toe and a small scab on the calf but these  are dry and seemed  to be improving. Patient also has a left carotid endarterectomy and multiple interventions to a right brachiocephalic AV fistula. After the visit he had recommended noninvasive studies to recheck her revascularization. He was off the impression that she would likely lose the right fourth toe and the third toe was likely to heal. [name] ([id number]) He was concerned about underlying muscle necrosis on her right heel and midfoot. 11/01/2015 -- an echo done in January of this year showed her left ventricular ejection fraction to be about 50-55%. The patient was seen by the PA and Dr. Driscilla Grammes office and the plan was to take her to the operating room soon to have a debridement under anesthesia for the abdominal wall wound, the Achilles tendon on the right leg and amputation of the right fourth toe. The daughter and the patient do not feel that they would be able to undergo hyperbaric oxygen therapy 5 days a week for 6 weeks. 12/17/15; this is a medically complex woman who I note was recently in this clinic however I was not involved with her care. She returns today with multiple wounds; a) she has a wound in the mid abdomen that is been there since March of this year. I note that she is been to the overall for debridement recently. The exact etiology of this wound is not really clear b) left lower quadrant abdominal wound had some sanguinous drainage when she came in here. The patient fell in January and thinks this may have been secondary to a hematoma. c): The patient has 3 wounds on her right leg including a small wound on the right mid calf, a large area over the Achilles which currently has a wound VAC for the last 6 weeks, also a smaller wound on the distal part of the right heel. As far as I understand most of these wounds are currently been dressed with's calcium alginate. According to her daughter the Achilles wound under the wound VAC is doing well d) the  patient is had an amputation of her left fifth toe in January and the right fourth toe 6 weeks ago secondary to diabetic PAD e) the patient has chronic renal failure on dialysis for the last 2 years secondary to type 2 diabetes on insulin. The daughter's knowledge there is been no biopsy of the abdominal wounds given their current appearance and lack of undefined etiology at have to wonder about calciphylaxis. 12/18/15:Addendum; I have reviewed [facility] I can see no relevant x-rays of the right heel. I note her arteriogram and revascularization of her right lower extremity in April 2017. She had debridement of both abdominal wounds and the right heel and Achilles wound on 11/07/15 at which time she had a right fourth toe ray amputation. The abdominal wounds were debridement again on 6/29. I do not see any relevant pathology of these abdominal wounds 12/24/15; culture I did of the drainage from the midline abdominal wound last week showed both Proteus and ampicillin sensitive enterococcus. I've given her a course of Augmentin adjusted on dialysis days. She has no specific complaints today. Been using Santyl to the abdominal wounds in the right leg wound and the wound VAC on the right Achilles which was initially prescribed by Dr. dew 12/31/15; I have done two punch biopsies of the large midline abdominal. 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 May have been a trauma component of the one on the left lower quadrant however the midline wound had no such history. She has a large area on  the right Achilles heel with a wound VAC prescribed by Dr. dew. A small wound on the right anterior leg.Marland Kitchen. UNFORTUNATELY she has 2 new wounds today. One on the left heel which is probably a pressure area. As well her previous amputation site of her right fourth toe has dehisced and now has a small wound with significant depth at the amputation site. 01/14/2016 -- she returns after 2 weeks and had had a punch biopsy of  abdominal wound done the last visit -- had a biopsy of her midline abdominal wound done and the Pathology diagnosis is that of ulceration, necrosis and inflammation and negative for dysplasia and malignancy. 01/21/16. 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 In the meantime she has new wounds of the left heel the right fourth toe amputation site is opened up. The back is stopped to the right heel area. 01/28/16; the abdominal wounds continued to improve. The extensive wound on her right Achilles also looks stable except for the lower aspect of the wound where there is a large liquefied area that probes right down to her calcaneus. This cultured Proteus last week I have her on Augmentin and doxycycline 1. I think this is Helane RimaHOPKINS, Tora Y. ([id number]) going to need a course of IV antibiotics and I will call dialysis. X-ray I did last week was negative, I think she is going to need an MRI 02/04/16; right heel MRI as before Saturday. Receiving I believe IV Rocephin at dialysis 02/11/16; as it turns out the patient could not have a MRI as she has a bladder stimulator in place even though it is not currently in use since the beginning of this year. Although she has an allergy to IV contrast she apparently has done well with premedication so we will have to go for a CT scan with contrast. In the meantime she has had a fall now has a large skin tear on her left upper arm. She went to the ER and they suggested Tegaderm over topical antibiotics 03/03/16 currently patient returns after having been hospitalized for 2 weeks and subsequently [facility]. She actually seems to be doing excellent compared to even when we last saw her according to our nursing staff. Both patient and her daughter are extremely encouraged at how well she is presenting at this point in time. Overall the biggest issue is still the right  Achilles area which is being managed at this point in time by Dr. Wyn Quakerew. Patient is currently utilizing a wound VAC to that region. 03/17/16; [facility]. Using Aquacel Ag to the wounds on her bilateral feet and still Prisma to the small open area on her abdomen. 03/31/16 at this point in time patient has been tolerating the dressing changes currently. She fortunately has no worsening of her symptoms although she tells me that the nurse who is caring [facility] decided that nothing was needed in regard to the lower abdominal wound from a dressing standpoint at this time. she is really not having significant discomfort or pain at this point she continues to have some tunneling in the proximal Achilles wound region. 04/07/16 patient continues to do well on evaluation today. Even the Achilles wound which has been the most tender is not giving her as much trouble. Unfortunately the PolyMem dressings that we utilize last week really did not seem to benefit her in particular.  Obviously we will discontinue that at this point in time today. 04-15-16:Ms. Schuneman is accompanied today by her daughter. She is still residing in an SNF undergoing dialysis, continues to receive antibiotics during dialysis as prescribed by Dr. Sampson Goon of infectious disease. She has a follow-up appointment with Dr. Sampson Goon on 04-17-16 to discuss the continuation of these antibiotics. Ms. Dattilio denies any pain to any of the 3 remaining wounds she does admit to changing to [employer] surgical shoes for safety while ambulating with physical therapy. She denies any falls since her last appointment here although she and her daughter do admit to increased tremors of unclear etiology since her last appointment. She has tolerated the dressing changes that were prescribed last week. 04-22-16 Ms. Wickstrom presents today accompanied by her daughter for evaluation of her diabetic  foot ulcers. She is still residing in an SNF, she continues dialysis 3 times weekly. She'll follow up with Dr. Sampson Goon last Friday, and at that appointment IV antibiotics were discontinued. According to Ms. Depolo and her daughter if there is any regression of her wounds Dr. Sampson Goon will consider re-starting the antibiotics. Ms. Hoopingarner daughter states that since the discontinuation of antibiotic therapy her "twitches" have resolved. 04/29/16; I have not seen this patient in quite some time however she is completed triple antibiotic therapy given at dialysis for osteomyelitis as prescribed by infectious disease. She is still being followed by Dr. dew of vascular surgery. We are using Hydrofera Blue to the surface of these wounds. She currently has 2 open areas a substantial area over her Achilles area although this is a lot better than the last time I saw this. She also has a small wound superiorly from this wound which has a superior probing depth of 2 cm. 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 My biopsy did not prove this nevertheless I think this is the correct clinical diagnosis. We are now following her for an area on the right Achilles part of her heel. This is not any different from last week. JAYCELYNN, KNICKERBOCKER ([id number]) She also has a small tunneling area just above this. And unfortunately this week there is been a reopening of an area where her right fourth toe was previously amputated. She is completed antibiotics 05/13/16; she has a new reopening on the mid abdominal wound in the same site is previously. This is a small open area. Apligraf #1 today to the areas on the right heel o2 still an open area in the fourth toe amputation site 05/27/16; the  areas on her abdomen are completely closed over. Small open area from last time is closed. Apligraf #2 today to the areas on the right heel. The 4th toe amputation site is also heel 06/10/16; we did not have an Apligraf to put on today. This appeared tunneling wound on the right posterior calf appears to be closed. The more substantial area on her Achilles itself is improved. 06/24/16; I reapplied her third Apligraf today although we have not had one debridement last time. The original tunneling wound is not as closed as last time. The more substantial area on her Achilles itself is improved with advancing epithelialization 07/08/16 Apligraf #4 today. The original long area over her Achilles as a healthy-looking area at the superior aspect and a small divot inferiorly.  Both of the wound surface is look healthy 07/22/16; patient arrived today with a 2 original wounds on the Achilles part of her ankle too small for another application of Apligraf. This is on the right Achilles area. The small divot inferiorly looks as though it may have skin over most of this and I can't really see an open area. She did have an erythematous area over the medial malleolus with some drainage this looks like a rapid injury not cellulitis 07/29/16; erythematous area over the medial malleolus seems a lot better this week. This was a wrap injury. The areas on the right Achilles is still a small divot. The area superiorly down somewhat in size. 08/05/16; condition is generally not feeling well. No major changed either wound. Using Prisma 08/19/16; patient was last here she was admitted to hospital last week. Apparently she was complaining mostly of abdominal pain however a CT scan of the abdomen and pelvis revealed changes in the L2-L3 interspace suspicious for discitis and osteomyelitis as well as changes in the adjacent paraspinal and psoas muscles. She was seen by infectious disease Dr. Sampson GoonFitzgerald. Her urine culture grew group B  strep. An aspirate of the fluid at L2-L3 I reviewed today. Her AFB Gram stain was negative, fungal culture is still in place. C + S is negative so far as of today. She is on vancomycin and ceftazidine dosed at dialysis for 6 weeks. She is not complaining of fever or back pain currently. We have been using [employer] and to her wounds 08/26/16; patient is still receiving IV antibiotics at dialysis. She does not feel well complaining of a lot of pain in the right hip radiating down her leg. She rubs the lateral aspect of the right hip over the greater trochanter. Her wound measurements were a lot of different this week although I don't think they were accurately recorded last week the wound on the right Achilles area looks about the same. Superficial without any depth. 09/09/16; patient is still receiving IV antibiotics at dialysis chide believe are vancomycin and ceftazidine. Since she was last here she is developed a new wound on the tip of her right great toe. The right Achilles wound had significant surface slough/necrotic material on arrival today Also of note the patient seems to be developing widespread myoclonic jerking which is gotten a lot worse over the last week to 2 although our intake nurse and the patient's daughter both state that she had had this at certain times in the past I had not specifically noticed this. Apparently at one point this was due to phosphorus abnormalities which were corrected at dialysis. I have reviewed her medication list I don't see any usual offenders except she is on Paxil Neurontin and Ultram although she takes the Ultram sparingly maybe once a week 09/15/16; still on vancomycin and ceftazidine as I understand things at dialysis. The area that was on the tip of her right great toe identified last week looks like an ischemic wound. She is known diabetic PAD and has had previous revascularization in 2017 by Dr. Wyn Quakerew. It is possible she may need to see him  again. I will probably attempt debridement of this next week we are using topical Santyl. In the meantime her heel wound has closed superiorly she is only left with a small wound at the inferior part of the Achilles area which was her deeper part of this wound at one point however even this area looks better 09/23/16; the patient has been  to see Dr. Sampson Goon and I gather because of the myoclonic jerking he is KEISA, BLOW ([id number]) changed one of her antibiotics probably the ceftazidine. Concerning today is the tip of her right great toe which seems to be an expanding necrotic area. She has known severe PAD and this looks like an ischemic wound to me. She has previously been revascularized by Dr.Dew in nearly part of 2017. We continue to make reasonable progression with the heel wound using Hydrofera Blue. We are using Prisma to the great toe on the right. 09/30/16; the patient looks a lot better today apparently her Rocephin was stopped also perhaps the vancomycin. She states she didn't get any antibiotics at dialysis. We have been using Hydrofera Blue to the heel area and Santyl to the right great toe. She has a follow-up with Dr. dew on 5/10with regards to the right toe wound which looks ischemic 10/07/16; the patient sees Dr. dew tomorrow however this is apparently about a dialysis access issue, I am hoping he will have a look at the right first toe. I'm thinking he will probably need to do another arterial study. The area on the right Achilles heel is for all intents and purposes resolved although there is still some foam herbal-looking areas under callus. We are using Hydrofera Blue to the Achilles and Santyl to the right first toe 10/14/16; repeat arteriogram booked for 5/31. right Achilles heel remains closed but in my mind vulnerable. tip of the right great toe unchanged 10/21/16; Angiography on 10/29/16. C/O severe low back pain related to previous discitis location Electronic  Signature(s) Signed: 10/21/2016 5:34:29 PM By: Baltazar Najjar MD Entered By: Baltazar Najjar on 10/21/2016 08:32:13 [name] ([id number]) -------------------------------------------------------------------------------- Physical Exam Details Patient Name: [name] Date of Service: 10/21/2016 8:00 AM Medical Record Patient Account Number: [id number] Number: Treating RN: Phillis Haggis [birth year] (82 y.o. Other Clinician: Date of Birth/Sex: Female) Treating Gricelda Foland Primary Care Provider: [name] Provider/Extender: G Referring Provider: Charolotte Capuchin in Treatment: 22 Constitutional Sitting or standing Blood Pressure is within target range for patient.Marland Kitchen Respirations regular, non-labored and within target range.. Temperature is normal and within the target range for the patient.Marland Kitchen appears in no distress. Eyes Conjunctivae clear. No discharge,no icterus. Respiratory Respiratory effort is easy and symmetric bilaterally. Rate is normal at rest and on room air.. Cardiovascular popliteal and felmoral pulse reduction on the right. Pedal pulses absent bilaterally.. Lymphatic none palpable in the right popliteal or inguinal area. Notes Wound exam; right Achilles area has some surface eschar in the inferior area howeve I saw no open area. -right great toe tip covered in surface slough which is not as adherent (santyl). No debridement as we are awaiting vascular input. no evidence of infection Electronic Signature(s) Signed: 10/21/2016 5:34:29 PM By: Baltazar Najjar MD Entered By: Baltazar Najjar on 10/21/2016 08:36:02 [name] ([id number]) -------------------------------------------------------------------------------- Physician Orders Details Patient Name: [name] Date of Service: 10/21/2016 8:00 AM Medical Record Patient Account Number: [id number] Number: Treating RN: Afful, RN, BSN, [place] 12-03-1933 (82 y.o. Other  Clinician: Date of Birth/Sex: Female) Treating Kerem Gilmer Primary Care Provider: [name] Provider/Extender: G Referring Provider: Charolotte Capuchin in Treatment: 34 Verbal / Phone Orders: No Diagnosis Coding Wound Cleansing Wound #20 Right Toe Great o Clean wound with Normal Saline. o Cleanse wound with mild soap and water o May shower with protection. Anesthetic Wound #20 Right Toe Great o Topical Lidocaine 4% cream applied to wound bed prior  to debridement - all wounds for clinic use only Primary Wound Dressing Wound #20 Right Toe Great o Santyl Ointment Secondary Dressing Wound #20 Right Toe Great o Dry Gauze o Kerlix and Coban o Foam Dressing Change Frequency Wound #20 Right Toe Great o Change dressing every other day. Follow-up Appointments Wound #20 Right Toe Great o Return Appointment in 2 weeks. - AFTER ANGIOGRAM ON 10/29/16 Off-Loading Wound #20 Right Toe Great o Other: - Float heels MIRIA, CAPPELLI ([id number]) Additional Orders / Instructions Wound #20 Right Toe Great o Activity as tolerated Electronic Signature(s) Signed: 10/21/2016 5:08:53 PM By: Elpidio Eric BSN, RN Signed: 10/21/2016 5:34:29 PM By: Baltazar Najjar MD Entered By: Elpidio Eric on 10/21/2016 08:25:35 [name] ([id number]) -------------------------------------------------------------------------------- Problem List Details Patient Name: YOALI, CONRY. Date of Service: 10/21/2016 8:00 AM Medical Record Patient Account Number: [id number] Number: Treating RN: Phillis Haggis 1933-11-26 (82 y.o. Other Clinician: Date of Birth/Sex: Female) Treating Danylle Ouk Primary Care Provider: [name] Provider/Extender: G Referring Provider: Charolotte Capuchin in Treatment: 37 Active Problems ICD-10 Encounter Code Description Active Date Diagnosis E11.621 Type 2 diabetes mellitus with foot ulcer 12/17/2015 Yes L97.512  Non-pressure chronic ulcer of other part of right foot with 03/31/2016 Yes fat layer exposed E11.51 Type 2 diabetes mellitus with diabetic peripheral 12/17/2015 Yes angiopathy without gangrene Inactive Problems ICD-10 Code Description Active Date Inactive Date M86.671 Other chronic osteomyelitis, right ankle and foot 04/15/2016 04/15/2016 S31.104A Unspecified open wound of abdominal wall, left lower 12/17/2015 12/17/2015 quadrant without penetration into peritoneal cavity, initial encounter S31.103D Unspecified open wound of abdominal wall, right lower 04/15/2016 04/15/2016 quadrant without penetration into peritoneal cavity, subsequent encounter Resolved Problems Electronic Signature(s) ZAHARAH, AMIR ([id number]) Signed: 10/21/2016 5:34:29 PM By: Baltazar Najjar MD Entered By: Baltazar Najjar on 10/21/2016 08:29:39 [name] ([id number]) -------------------------------------------------------------------------------- Progress Note Details Patient Name: [name] Date of Service: 10/21/2016 8:00 AM Medical Record Patient Account Number: [id number] Number: Treating RN: Afful, RN, BSN, Hilbert Sink May 04, 1934 (82 y.o. Other Clinician: Date of Birth/Sex: Female) Treating Donis Kotowski Primary Care Provider: [name] Provider/Extender: G Referring Provider: Charolotte Capuchin in Treatment: 89 Subjective Chief Complaint Information obtained from Patient Ms. Hoston presents today for follow-up evaluation of her diabetic foot ulcers and abdominal wound. History of Present Illness (HPI) The following HPI elements were documented for the patient's wound: Location: right posterior heel, right Achilles, right lower quadrant abdomen, right fourth toe amp site Quality: denies pain to any wound Severity: not applicable Timing: denies pain 83 year old patient who is known to be diabetic, was referred to [place] by Dr. Gavin Potters for a right heel ulceration which she's  had for a while. She was recently in hospital for a pneumonia and at that time and got delirious and was disoriented and sometime during this time developed a stage II ulcer on her right heel. Her past medical history is significant for bilateral pneumonia which was treated with injectable antibiotics and then to oral Levaquin which he has completed. She also has acute on chronic diastolic CHF, acute on chronic respiratory failure, end-stage renal disease on hemodialysis, atrial fibrillation, recent stroke, diabetes mellitus. The patient and her son are poor historians but from what I understand she was admitted to the hospital with an acute vascular compromise of her right lower extremity and Dr. Wyn Quaker has done a surgical procedure and we are trying to obtain these notes. There are also some vascular workup done and we will try and obtain these notes.  the injury to the left lower quadrant of abdomen and the suprapubic area have been there due to a bruise and have been there for several months and no intervention has been done. 10/11/2015 -- on review of the electronics records it was noted that the patient was admitted to the hospital on 09/14/2015 with peripheral vascular disease with claudication, end-stage renal disease, pressure ulcer, chronic atrial fibrillation. She was seen by Dr. Wyn Quaker who did her right lower extremity angiogram , angioplasty of the right anterior tibial artery and thrombolysis with TPA of the right popliteal artery, and thrombectomy. She was seen by Dr. Wyn Quaker during this past week and he was pleased with the progress. He did say that if he took her to the operating room for any procedure he would debride the abdominal wound under anesthesia. She was also seen by Dr. Ether Griffins the podiatrist who thought that she may lose her right fourth toe at some stage may need an amputation of this. JOHNELL, LANDOWSKI ([id number]) 10/21/2015 --patient known to Dr. Wyn Quaker and his last office visit  from 10/04/2015 has been reviewed. 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 She also had a persistent ulceration of the right heel and markedly this area and a right third and fourth toe and a small scab on the calf but these are dry and seemed to be improving. Patient also has a left carotid endarterectomy and multiple interventions to a right brachiocephalic AV fistula. After the visit he had recommended noninvasive studies to recheck her revascularization. He was off the impression that she would likely lose the right fourth toe and the third toe was likely to heal. He was concerned about underlying muscle necrosis on her right heel and midfoot. 11/01/2015 -- an echo done in January of this year showed her left ventricular ejection fraction to be about 50-55%. The patient was seen by the PA and Dr. Driscilla Grammes office and the plan was to take her to the operating room soon to have a debridement under anesthesia for the abdominal wall wound, the Achilles tendon on the right leg and amputation of the right fourth toe. The daughter and the patient do not feel that they would be able to undergo hyperbaric oxygen therapy 5 days a week for 6 weeks. 12/17/15; this is a medically complex woman who I note was recently in this clinic however I was not involved with her care. She returns today with multiple wounds; a) she has a wound in the mid abdomen that is been there since March of this year. I note that she is been to the overall for debridement recently. The exact etiology of this wound is not really clear b) left lower quadrant abdominal wound had some sanguinous drainage when she came in here. The patient fell in January and thinks this may have been secondary to a hematoma. c): The patient has 3 wounds on her right leg including a small wound on the right mid calf, a large  area over the Achilles which currently has a wound VAC for the last 6 weeks, also a smaller wound on the distal part of the right heel. As far as I understand most of these wounds are currently been dressed with's calcium alginate. According to her daughter the Achilles wound under the wound VAC is doing well d) the patient is had an amputation of her left fifth toe in January and the  right fourth toe 6 weeks ago secondary to diabetic PAD e) the patient has chronic renal failure on dialysis for the last 2 years secondary to type 2 diabetes on insulin. The daughter's knowledge there is been no biopsy of the abdominal wounds given their current appearance and lack of undefined etiology at have to wonder about calciphylaxis. 12/18/15:Addendum; I have reviewed [facility] I can see no relevant x-rays of the right heel. I note her arteriogram and revascularization of her right lower extremity in April 2017. She had debridement of both abdominal wounds and the right heel and Achilles wound on 11/07/15 at which time she had a right fourth toe ray amputation. The abdominal wounds were debridement again on 6/29. I do not see any relevant pathology of these abdominal wounds 12/24/15; culture I did of the drainage from the midline abdominal wound last week showed both Proteus and ampicillin sensitive enterococcus. I've given her a course of Augmentin adjusted on dialysis days. She has no specific complaints today. Been using Santyl to the abdominal wounds in the right leg wound and the wound VAC on the right Achilles which was initially prescribed by Dr. dew 12/31/15; I have done two punch biopsies of the large midline abdominal. 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 May have been a trauma component of the one on the left lower quadrant however the midline wound had no such history. She has a large area on the right Achilles heel with a wound VAC prescribed by Dr. dew. A small wound on the right anterior leg.Marland Kitchen  UNFORTUNATELY she has 2 new wounds today. One on the left heel which is probably a pressure area. As well her previous amputation site of her right fourth toe has dehisced and now has a small wound with significant depth at the amputation site. 01/14/2016 -- she returns after 2 weeks and had had a punch biopsy of abdominal wound done the last visit SHANETTA, NICOLLS ([id number]) -- had a biopsy of her midline abdominal wound done and the Pathology diagnosis is that of ulceration, necrosis and inflammation and negative for dysplasia and malignancy. 01/21/16. 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 In the meantime she has new wounds of the left heel the right fourth toe amputation site is opened up. The back is stopped to the right heel area. 01/28/16; the abdominal wounds continued to improve. The extensive wound on her right Achilles also looks stable except for the lower aspect of the wound where there is a large liquefied area that probes right down to her calcaneus. This cultured Proteus last week I have her on Augmentin and doxycycline 1. I think this is going to need a course of IV antibiotics and I will call dialysis. X-ray I did last week was negative, I think she is going to need an MRI 02/04/16; right heel MRI as before Saturday. Receiving I believe IV Rocephin at dialysis 02/11/16; as it turns out the patient could not have a MRI as she has a bladder stimulator in place even though it is not currently in use since the beginning of this year. Although she has an allergy to IV contrast she apparently has done well with premedication so we will have to go for a CT scan with contrast. In the meantime she has had a fall now has a large skin tear on her left upper arm. She went to the ER and they suggested Tegaderm over topical antibiotics 03/03/16 currently  patient returns after having been hospitalized for 2 weeks and  subsequently transferred to [facility] nursing facility. She actually seems to be doing excellent compared to even when we last saw her according to our nursing staff. Both patient and her daughter are extremely encouraged at how well she is presenting at this point in time. Overall the biggest issue is still the right Achilles area which is being managed at this point in time by Dr. Wyn Quaker. Patient is currently utilizing a wound VAC to that region. 03/17/16; patient is at [facility] nursing home still. Using Aquacel Ag to the wounds on her bilateral feet and still Prisma to the small open area on her abdomen. 03/31/16 at this point in time patient has been tolerating the dressing changes currently. She fortunately has no worsening of her symptoms although she tells me that the nurse who is caring for her at [facility] nursing facility decided that nothing was needed in regard to the lower abdominal wound from a dressing standpoint at this time. she is really not having significant discomfort or pain at this point she continues to have some tunneling in the proximal Achilles wound region. 04/07/16 patient continues to do well on evaluation today. Even the Achilles wound which has been the most tender is not giving her as much trouble. Unfortunately the PolyMem dressings that we utilize last week really did not seem to benefit her in particular. Obviously we will discontinue that at this point in time today. 04-15-16:Ms. Deen is accompanied today by her daughter. She is still residing in an SNF undergoing dialysis, continues to receive antibiotics during dialysis as prescribed by Dr. Sampson Goon of infectious disease. She has a follow-up appointment with Dr. Sampson Goon on 04-17-16 to discuss the continuation of these antibiotics. Ms. Trueba denies any pain to any of the 3 remaining wounds she does admit to changing to [employer] surgical shoes for safety while ambulating with physical therapy. She denies any  falls since her last appointment here although she and her daughter do admit to increased tremors of unclear etiology since her last appointment. She has tolerated the dressing changes that were prescribed last week. 04-22-16 Ms. Tiemann presents today accompanied by her daughter for evaluation of her diabetic foot ulcers. She is still residing in an SNF, she continues dialysis 3 times weekly. She'll follow up with Dr. Sampson Goon last Friday, and at that appointment IV antibiotics were discontinued. According to Ms. Kleman and her daughter if there is any regression of her wounds Dr. Sampson Goon will consider re-starting the antibiotics. Ms. Zullo daughter states that since the discontinuation of antibiotic therapy her "twitches" have resolved. 04/29/16; I have not seen this patient in quite some time however she is completed triple antibiotic therapy given at dialysis for osteomyelitis as prescribed by infectious disease. She is still being followed by Dr. Wyn Quaker [name] ([id number]) of vascular surgery. We are using Hydrofera Blue to the surface of these wounds. She currently has 2 open areas a substantial area over her Achilles area although this is a lot better than the last time I saw this. She also has a small wound superiorly from this wound which has a superior probing depth of 2 cm. 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 My biopsy did not prove this nevertheless I think  this is the correct clinical diagnosis. We are now following her for an area on the right Achilles part of her heel. This is not any different from last week. She also has a small tunneling area just above this. And unfortunately this week there is been a reopening of an area where her  right fourth toe was previously amputated. She is completed antibiotics 05/13/16; she has a new reopening on the mid abdominal wound in the same site is previously. This is a small open area. Apligraf #1 today to the areas on the right heel o2 still an open area in the fourth toe amputation site 05/27/16; the areas on her abdomen are completely closed over. Small open area from last time is closed. Apligraf #2 today to the areas on the right heel. The 4th toe amputation site is also heel 06/10/16; we did not have an Apligraf to put on today. This appeared tunneling wound on the right posterior calf appears to be closed. The more substantial area on her Achilles itself is improved. 06/24/16; I reapplied her third Apligraf today although we have not had one debridement last time. The original tunneling wound is not as closed as last time. The more substantial area on her Achilles itself is improved with advancing epithelialization 07/08/16 Apligraf #4 today. The original long area over her Achilles as a healthy-looking area at the superior aspect and a small divot inferiorly. Both of the wound surface is look healthy 07/22/16; patient arrived today with a 2 original wounds on the Achilles part of her ankle too small for another application of Apligraf. This is on the right Achilles area. The small divot inferiorly looks as though it may have skin over most of this and I can't really see an open area. She did have an erythematous area over the medial malleolus with some drainage this looks like a rapid injury not cellulitis 07/29/16; erythematous area over the medial malleolus seems a lot better this week. This was a wrap injury. The areas on the right Achilles is still a small divot. The area superiorly down somewhat in size. 08/05/16; condition is generally not feeling well. No major changed either wound. Using Prisma 08/19/16; patient was last here she was admitted to hospital last week. Apparently she  was complaining mostly of abdominal pain however a CT scan of the abdomen and pelvis revealed changes in the L2-L3 interspace suspicious for discitis and osteomyelitis as well as changes in the adjacent paraspinal and psoas muscles. She was seen by infectious disease Dr. Sampson Goon. Her urine culture grew group B strep. An aspirate of the fluid at L2-L3 I reviewed today. Her AFB Gram stain was negative, fungal culture is still in place. C + S is negative so far as of today. She is on vancomycin and ceftazidine dosed at dialysis for 6 weeks. She is not complaining of fever or back pain currently. We have been using [employer] and to her wounds 08/26/16; patient is still receiving IV antibiotics at dialysis. She does not feel well complaining of a lot of pain in the right hip radiating down her leg. She rubs the lateral aspect of the right hip over the greater trochanter. Her wound measurements were a lot of different this week although I don't think they were accurately recorded last week the wound on the right Achilles area looks about the same. Superficial without any depth. 09/09/16; patient is still receiving IV antibiotics at dialysis chide believe are vancomycin and ceftazidine. Since she was  last here she is developed a new wound on the tip of her right great toe. The right Achilles wound had significant surface slough/necrotic material on arrival today Also of note the patient seems to be developing widespread myoclonic jerking which is gotten a lot worse over the last week to 2 although our intake nurse and the patient's daughter both state that she had had this at certain times in the past I had not specifically noticed this. Apparently at one point this was due to phosphorus abnormalities which were corrected at dialysis. I have reviewed her medication list I don't see any usual offenders except she is on Paxil Neurontin and Ultram although she takes the Ultram sparingly LILI, HARTS ([id number]) maybe once a week 09/15/16; still on vancomycin and ceftazidine as I understand things at dialysis. The area that was on the tip of her right great toe identified last week looks like an ischemic wound. She is known diabetic PAD and has had previous revascularization in 2017 by Dr. Wyn Quaker. It is possible she may need to see him again. I will probably attempt debridement of this next week we are using topical Santyl. In the meantime her heel wound has closed superiorly she is only left with a small wound at the inferior part of the Achilles area which was her deeper part of this wound at one point however even this area looks better 09/23/16; the patient has been to see Dr. Sampson Goon and I gather because of the myoclonic jerking he is changed one of her antibiotics probably the ceftazidine. Concerning today is the tip of her right great toe which seems to be an expanding necrotic area. She has known severe PAD and this looks like an ischemic wound to me. She has previously been revascularized by Dr.Dew in nearly part of 2017. We continue to make reasonable progression with the heel wound using Hydrofera Blue. We are using Prisma to the great toe on the right. 09/30/16; the patient looks a lot better today apparently her Rocephin was stopped also perhaps the vancomycin. She states she didn't get any antibiotics at dialysis. We have been using Hydrofera Blue to the heel area and Santyl to the right great toe. She has a follow-up with Dr. dew on 5/10with regards to the right toe wound which looks ischemic 10/07/16; the patient sees Dr. dew tomorrow however this is apparently about a dialysis access issue, I am hoping he will have a look at the right first toe. I'm thinking he will probably need to do another arterial study. The area on the right Achilles heel is for all intents and purposes resolved although there is still some foam herbal-looking areas under callus. We are using  Hydrofera Blue to the Achilles and Santyl to the right first toe 10/14/16; repeat arteriogram booked for 5/31. right Achilles heel remains closed but in my mind vulnerable. tip of the right great toe unchanged 10/21/16; Angiography on 10/29/16. C/O severe low back pain related to previous discitis location Objective Constitutional Sitting or standing Blood Pressure is within target range for patient.Marland Kitchen Respirations regular, non-labored and within target range.. Temperature is normal and within the target range for the patient.Marland Kitchen appears in no distress. Vitals Time Taken: 8:12 AM, Height: 65 in, Weight: 160 lbs, BMI: 26.6, Temperature: 97.5 F, Pulse: 75 bpm, Respiratory Rate: 17 breaths/min, Blood Pressure: 116/51 mmHg. Eyes Conjunctivae clear. No discharge,no icterus. Respiratory Respiratory effort is easy and symmetric bilaterally. Rate is normal at rest and on room air.Marland Kitchen  Cardiovascular popliteal and felmoral pulse reduction on the right. Pedal pulses absent bilaterally.Marland Kitchen YASSMIN, BINEGAR ([id number]) Lymphatic none palpable in the right popliteal or inguinal area. General Notes: Wound exam; right Achilles area has some surface eschar in the inferior area howeve I saw no open area. -right great toe tip covered in surface slough which is not as adherent (santyl). No debridement as we are awaiting vascular input. no evidence of infection Integumentary (Hair, Skin) Wound #20 status is Open. Original cause of wound was Gradually Appeared. The wound is located on the Right Toe Great. The wound measures 1.8cm length x 1.5cm width x 0.1cm depth; 2.121cm^2 area and 0.212cm^3 volume. There is Fat Layer (Subcutaneous Tissue) Exposed exposed. There is no tunneling or undermining noted. There is a large amount of serous drainage noted. The wound margin is distinct with the outline attached to the wound base. There is no granulation within the wound bed. There is a large (67- 100%) amount of necrotic  tissue within the wound bed including Adherent Slough. The periwound skin appearance did not exhibit: Callus, Crepitus, Excoriation, Induration, Rash, Scarring, Dry/Scaly, Maceration, Atrophie Blanche, Cyanosis, Ecchymosis, Hemosiderin Staining, Mottled, Pallor, Rubor, Erythema. Periwound temperature was noted as No Abnormality. The periwound has tenderness on palpation. Assessment Active Problems ICD-10 E11.621 - Type 2 diabetes mellitus with foot ulcer L97.512 - Non-pressure chronic ulcer of other part of right foot with fat layer exposed E11.51 - Type 2 diabetes mellitus with diabetic peripheral angiopathy without gangrene Plan Wound Cleansing: Wound #20 Right Toe Great: Clean wound with Normal Saline. Cleanse wound with mild soap and water May shower with protection. Anesthetic: Wound #20 Right Toe Great: Topical Lidocaine 4% cream applied to wound bed prior to debridement - all wounds for clinic use only Primary Wound Dressing: Wound #20 Right Toe Great: [address] ABRYANA, LYKENS ([id number]) Secondary Dressing: Wound #20 Right Toe Great: Dry Gauze Kerlix and Coban Foam Dressing Change Frequency: Wound #20 Right Toe Great: Change dressing every other day. Follow-up Appointments: Wound #20 Right Toe Great: Return Appointment in 2 weeks. - AFTER ANGIOGRAM ON 10/29/16 Off-Loading: Wound #20 Right Toe Great: Other: - Float heels Additional Orders / Instructions: Wound #20 Right Toe Great: Activity as tolerated foam protection to the heel santyl to the right great toe, will require debridement after vascuar eval at some point Electronic Signature(s) Signed: 10/21/2016 5:34:29 PM By: Baltazar Najjar MD Entered By: Baltazar Najjar on 10/21/2016 08:39:06 [name] ([id number]) -------------------------------------------------------------------------------- SuperBill Details Patient Name: [name] Date of Service: 10/21/2016 Medical Record Patient  Account Number: [id number] Number: Treating RN: Clover Mealy, RN, BSN, Rita 25-Aug-1933 [id number] y.o. Other Clinician: Date of Birth/Sex: Female) Treating Daysean Tinkham Primary Care Provider: [name] Provider/Extender: G Referring Provider: Charolotte Capuchin in Treatment: 44 Diagnosis Coding ICD-10 Codes Code Description E11.621 Type 2 diabetes mellitus with foot ulcer L97.512 Non-pressure chronic ulcer of other part of right foot with fat layer exposed E11.51 Type 2 diabetes mellitus with diabetic peripheral angiopathy without gangrene Facility Procedures CPT4 Code: [id number] Description: [id number] - WOUND CARE VISIT-LEV 2 EST PT Modifier: Quantity: 1 Physician Procedures CPT4 Code Description: [id number] - WC PHYS LEVEL 3 - EST PT ICD-10 Description Diagnosis E11.621 Type 2 diabetes mellitus with foot ulcer L97.512 Non-pressure chronic ulcer of other part of right fo Modifier: ot with fat lay Quantity: 1 er exposed Electronic Signature(s) Signed: 10/21/2016 5:34:29 PM By: Baltazar Najjar MD Entered By: Baltazar Najjar on 10/21/2016 08:39:51

## 2016-10-23 NOTE — Progress Notes (Signed)
Natalie, [name] ([id number]) Visit Report for 10/21/2016 Arrival Information Details Patient Name: Natalie [name], [name]. Date of Service: 10/21/2016 8:00 AM Medical Record Patient Account Number: [id number] Number: Treating RN: Afful, RN, BSN, [place] Sink 1934/05/25 (83 y.o. Other Clinician: Date of Birth/Sex: Female) Treating ROBSON, MICHAEL Primary Care Kaina Orengo: [name] Brennyn Haisley/Extender: G Referring Josefita Weissmann: Charolotte Capuchin in Treatment: 78 Visit Information History Since Last Visit All ordered tests and consults were completed: No Patient Arrived: Wheel Chair Added or deleted any medications: No Arrival Time: 08:12 Any new allergies or adverse reactions: No Accompanied By: dtr Had a fall or experienced change in No Transfer Assistance: None activities of daily living that may affect Patient Identification Verified: Yes risk of falls: Secondary Verification Process Yes Signs or symptoms of abuse/neglect since last No Completed: visito Patient Requires Transmission- No Hospitalized since last visit: No Based Precautions: Has Dressing in Place as Prescribed: Yes Patient Has Alerts: Yes Pain Present Now: No Patient Alerts: Patient on Blood Thinner DMII Warfarin ABI [place] Bilateral NO BP RIGHT ARM Dialysis Pt Electronic Signature(s) Signed: 10/21/2016 5:08:53 PM By: Elpidio Eric BSN, RN Entered By: Elpidio Eric on 10/21/2016 08:13:47 Natalie [name] [name] ([id number]) -------------------------------------------------------------------------------- Clinic Level of Care Assessment Details Patient Name: Natalie [name] [name]. Date of Service: 10/21/2016 8:00 AM Medical Record Patient Account Number: [id number] Number: Treating RN: Afful, RN, BSN, [place] Sink [birth year] (83 y.o. Other Clinician: Date of Birth/Sex: Female) Treating ROBSON, MICHAEL Primary Care Itsel Opfer: [name] Jesica Goheen/Extender: G Referring Bristyl Mclees: Charolotte Capuchin in Treatment:  73 Clinic Level of Care Assessment Items TOOL 4 Quantity Score []  - Use when only an EandM is performed on FOLLOW-UP visit 0 ASSESSMENTS - Nursing Assessment / Reassessment X - Reassessment of Co-morbidities (includes updates in patient status) 1 10 X - Reassessment of Adherence to Treatment Plan 1 5 ASSESSMENTS - Wound and Skin Assessment / Reassessment X - Simple Wound Assessment / Reassessment - one wound 1 5 []  - Complex Wound Assessment / Reassessment - multiple wounds 0 []  - Dermatologic / Skin Assessment (not related to wound area) 0 ASSESSMENTS - Focused Assessment []  - Circumferential Edema Measurements - multi extremities 0 []  - Nutritional Assessment / Counseling / Intervention 0 X - Lower Extremity Assessment (monofilament, tuning fork, pulses) 1 5 []  - Peripheral Arterial Disease Assessment (using hand held doppler) 0 ASSESSMENTS - Ostomy and/or Continence Assessment and Care []  - Incontinence Assessment and Management 0 []  - Ostomy Care Assessment and Management (repouching, etc.) 0 PROCESS - Coordination of Care X - Simple Patient / Family Education for ongoing care 1 15 []  - Complex (extensive) Patient / Family Education for ongoing care 0 []  - Staff obtains Chiropractor, Records, Test Results / Process Orders 0 []  - Staff telephones HHA, Nursing Homes / Clarify orders / etc 0 Natalie [name] [name] ([id number]) []  - Routine Transfer to another Facility (non-emergent condition) 0 []  - Routine Hospital Admission (non-emergent condition) 0 []  - New Admissions / Manufacturing engineer / Ordering NPWT, Apligraf, etc. 0 []  - Emergency Hospital Admission (emergent condition) 0 []  - Simple Discharge Coordination 0 []  - Complex (extensive) Discharge Coordination 0 PROCESS - Special Needs []  - Pediatric / Minor Patient Management 0 []  - Isolation Patient Management 0 []  - Hearing / Language / Visual special needs 0 []  - Assessment of Community assistance (transportation, D/C  planning, etc.) 0 []  - Additional assistance / Altered mentation 0 []  - Support Surface(s) Assessment (bed, cushion, seat, etc.) 0 INTERVENTIONS - Wound Cleansing /  Measurement X - Simple Wound Cleansing - one wound 1 5 []  - Complex Wound Cleansing - multiple wounds 0 X - Wound Imaging (photographs - any number of wounds) 1 5 []  - Wound Tracing (instead of photographs) 0 X - Simple Wound Measurement - one wound 1 5 []  - Complex Wound Measurement - multiple wounds 0 INTERVENTIONS - Wound Dressings X - Small Wound Dressing one or multiple wounds 1 10 []  - Medium Wound Dressing one or multiple wounds 0 []  - Large Wound Dressing one or multiple wounds 0 []  - Application of Medications - topical 0 []  - Application of Medications - injection 0 Natalie [name] [name] ([id number]) INTERVENTIONS - Miscellaneous []  - External ear exam 0 []  - Specimen Collection (cultures, biopsies, blood, body fluids, etc.) 0 []  - Specimen(s) / Culture(s) sent or taken to Lab for analysis 0 []  - Patient Transfer (multiple staff / Michiel Sites Lift / Similar devices) 0 []  - Simple Staple / Suture removal (25 or less) 0 []  - Complex Staple / Suture removal (26 or more) 0 []  - Hypo / Hyperglycemic Management (close monitor of Blood Glucose) 0 []  - Ankle / Brachial Index (ABI) - do not check if billed separately 0 X - Vital Signs 1 5 Has the patient been seen at the hospital within the last three years: Yes Total Score: 70 Level Of Care: New/Established - Level 2 Electronic Signature(s) Signed: 10/21/2016 5:08:53 PM By: Elpidio Eric BSN, RN Entered By: Elpidio Eric on 10/21/2016 08:24:51 Natalie [name] [name] ([id number]) -------------------------------------------------------------------------------- Encounter Discharge Information Details Patient Name: Natalie [name] [name]. Date of Service: 10/21/2016 8:00 AM Medical Record Patient Account Number: [id number] Number: Treating RN: Phillis Haggis 02/17/1934 (83 y.o. Other  Clinician: Date of Birth/Sex: Female) Treating ROBSON, MICHAEL Primary Care [name] Morneault: [name] Yaslyn Cumby/Extender: G Referring Willet Schleifer: Charolotte Capuchin in Treatment: 75 Encounter Discharge Information Items Discharge Pain Level: 0 Discharge Condition: Stable Ambulatory Status: Wheelchair Discharge Destination: Home Transportation: Private Auto Accompanied By: dtr Schedule Follow-up Appointment: No Medication Reconciliation completed and provided to Patient/Care No Gibson Lad: Provided on Clinical Summary of Care: 10/21/2016 Form Type Recipient Paper Patient [facility] Electronic Signature(s) Signed: 10/21/2016 5:08:53 PM By: Elpidio Eric BSN, RN Previous Signature: 10/21/2016 8:33:48 AM Version By: Gwenlyn Perking Entered By: Elpidio Eric on 10/21/2016 08:34:42 Natalie [name] [name] ([id number]) -------------------------------------------------------------------------------- General Visit Notes Details Patient Name: Natalie [name] [name] Date of Service: 10/21/2016 8:00 AM Medical Record Patient Account Number: [id number] Number: Treating RN: Afful, RN, BSN, [place] Sink 01-Nov-1933 (82 y.o. Other Clinician: Date of Birth/Sex: Female) Treating ROBSON, MICHAEL Primary Care Berea Majkowski: [name] Ameliya Nicotra/Extender: G Referring Garfield Coiner: Charolotte Capuchin in Treatment: 65 Notes PATIENT HAS ANGIOGRAM ON MAY 31ST. Electronic Signature(s) Signed: 10/21/2016 5:08:53 PM By: Elpidio Eric BSN, RN Entered By: Elpidio Eric on 10/21/2016 08:23:43 Natalie [name] [name] ([id number]) -------------------------------------------------------------------------------- Lower Extremity Assessment Details Patient Name: DONICA, DEROUIN. Date of Service: 10/21/2016 8:00 AM Medical Record Patient Account Number: [id number] Number: Treating RN: Afful, RN, BSN, [place] Sink [birth year] (82 y.o. Other Clinician: Date of Birth/Sex: Female) Treating ROBSON, MICHAEL Primary Care Marijean Montanye: [name] Tajon Moring/Extender: G Referring Melinda Pottinger: Charolotte Capuchin in Treatment: 44 Edema Assessment Assessed: [Left: No] [Right: No] Edema: [Left: N] [Right: o] Vascular Assessment Claudication: Claudication Assessment [Right:None] Pulses: Dorsalis Pedis Palpable: [Right:Yes] Posterior Tibial Extremity colors, hair growth, and conditions: Extremity Color: [Right:Mottled] Hair Growth on Extremity: [Right:No] Temperature of Extremity: [Right:Warm] Capillary Refill: [Right:< 3 seconds] Toe Nail Assessment Left: Right: Thick: Yes Discolored: Yes Deformed: No  Improper Length and Hygiene: No Electronic Signature(s) Signed: 10/21/2016 5:08:53 PM By: Elpidio EricAfful, Rita BSN, RN Entered By: Elpidio EricAfful, Rita on 10/21/2016 08:21:10 Natalie [name] RimaHOPKINS, Sharryn Y. ([id number]) -------------------------------------------------------------------------------- Multi Wound Chart Details Patient Name: Natalie [name] RimaHOPKINS, Adrie Y. Date of Service: 10/21/2016 8:00 AM Medical Record Patient Account Number: [id number] Number: Treating RN: Afful, RN, BSN, [place] Sinkita 01/25/1934 (82 y.o. Other Clinician: Date of Birth/Sex: Female) Treating ROBSON, MICHAEL Primary Care Danae Oland: Rolm GalaGrandis, Heidi Quyen Cutsforth/Extender: G Referring Fountain Derusha: Charolotte CapuchinGrandis, Heidi Weeks in Treatment: 6044 Vital Signs Height(in): 65 Pulse(bpm): 75 Weight(lbs): 160 Blood Pressure 116/51 (mmHg): Body Mass Index(BMI): 27 Temperature(F): 97.5 Respiratory Rate 17 (breaths/min): Photos: [20:No Photos] [N/A:N/A] Wound Location: [20:Right Toe Great] [N/A:N/A] Wounding Event: [20:Gradually Appeared] [N/A:N/A] Primary Etiology: [20:Atypical] [N/A:N/A] Comorbid History: [20:Arrhythmia, Congestive Heart Failure, Hypertension, Type II Diabetes] [N/A:N/A] Date Acquired: [20:09/09/2016] [N/A:N/A] Weeks of Treatment: [20:6] [N/A:N/A] Wound Status: [20:Open] [N/A:N/A] Measurements L x W x D 1.8x1.5x0.1 [N/A:N/A] (cm) Area (cm) : [20:2.121] [N/A:N/A] Volume  (cm) : [20:0.212] [N/A:N/A] % Reduction in Area: [20:-275.40%] [N/A:N/A] % Reduction in Volume: -271.90% [N/A:N/A] Classification: [20:Partial Thickness] [N/A:N/A] HBO Classification: [20:Grade 1] [N/A:N/A] Exudate Amount: [20:Large] [N/A:N/A] Exudate Type: [20:Serous] [N/A:N/A] Exudate Color: [20:amber] [N/A:N/A] Wound Margin: [20:Distinct, outline attached] [N/A:N/A] Granulation Amount: [20:None Present (0%)] [N/A:N/A] Necrotic Amount: [20:Large (67-100%)] [N/A:N/A] Exposed Structures: [20:Fat Layer (Subcutaneous Tissue) Exposed: Yes Fascia: No] [N/A:N/A] Tendon: No Muscle: No Joint: No Bone: No Epithelialization: None N/A N/A Periwound Skin Texture: Excoriation: No N/A N/A Induration: No Callus: No Crepitus: No Rash: No Scarring: No Periwound Skin Maceration: No N/A N/A Moisture: Dry/Scaly: No Periwound Skin Color: Atrophie Blanche: No N/A N/A Cyanosis: No Ecchymosis: No Erythema: No Hemosiderin Staining: No Mottled: No Pallor: No Rubor: No Temperature: No Abnormality N/A N/A Tenderness on Yes N/A N/A Palpation: Wound Preparation: Ulcer Cleansing: N/A N/A Rinsed/Irrigated with Saline Topical Anesthetic Applied: Other: lidocaine 4% Treatment Notes Electronic Signature(s) Signed: 10/21/2016 5:34:29 PM By: Baltazar Najjarobson, Michael MD Entered By: Baltazar Najjarobson, Michael on 10/21/2016 08:29:54 Natalie [name] RimaHOPKINS, Cameren Y. ([id number]) -------------------------------------------------------------------------------- Multi-Disciplinary Care Plan Details Patient Name: Natalie [name] RimaHOPKINS, Jalexa Y. Date of Service: 10/21/2016 8:00 AM Medical Record Patient Account Number: [id number] Number: Treating RN: Afful, RN, BSN, [place] Sinkita 01/25/1934 (82 y.o. Other Clinician: Date of Birth/Sex: Female) Treating ROBSON, MICHAEL Primary Care Jahanna Raether: Rolm GalaGrandis, Heidi Janeah Kovacich/Extender: G Referring Vivien Barretto: Charolotte CapuchinGrandis, Heidi Weeks in Treatment: 5944 Active Inactive ` Abuse / Safety / Falls / Self Care  Management Nursing Diagnoses: Impaired physical mobility Potential for falls Goals: Patient will remain injury free Date Initiated: 12/17/2015 Target Resolution Date: 08/29/2016 Goal Status: Active Interventions: Assess fall risk on admission and as needed Notes: ` Necrotic Tissue Nursing Diagnoses: Impaired tissue integrity related to necrotic/devitalized tissue Knowledge deficit related to management of necrotic/devitalized tissue Goals: Necrotic/devitalized tissue will be minimized in the wound bed Date Initiated: 02/04/2016 Target Resolution Date: 08/29/2016 Goal Status: Active Interventions: Assess patient pain level pre-, during and post procedure and prior to discharge Provide education on necrotic tissue and debridement process Treatment Activities: Apply topical anesthetic as ordered : 02/04/2016 Natalie [name] RimaHOPKINS, Liba Y. ([id number]) Notes: ` Wound/Skin Impairment Nursing Diagnoses: Impaired tissue integrity Goals: Patient/caregiver will verbalize understanding of skin care regimen Date Initiated: 12/17/2015 Target Resolution Date: 08/29/2016 Goal Status: Active Ulcer/skin breakdown will have a volume reduction of 30% by week 4 Date Initiated: 12/17/2015 Target Resolution Date: 08/29/2016 Goal Status: Active Ulcer/skin breakdown will have a volume reduction of 50% by week 8 Date Initiated: 12/17/2015 Target Resolution Date: 08/29/2016 Goal Status: Active Ulcer/skin breakdown will have a volume reduction of 80%  by week 12 Date Initiated: 12/17/2015 Target Resolution Date: 08/29/2016 Goal Status: Active Ulcer/skin breakdown will heal within 14 weeks Date Initiated: 12/17/2015 Target Resolution Date: 08/29/2016 Goal Status: Active Interventions: Assess patient/caregiver ability to obtain necessary supplies Assess patient/caregiver ability to perform ulcer/skin care regimen upon admission and as needed Assess ulceration(s) every visit Notes: Electronic Signature(s) Signed:  10/21/2016 5:08:53 PM By: Elpidio Eric BSN, RN Entered By: Elpidio Eric on 10/21/2016 08:21:16 Natalie [name] [name] ([id number]) -------------------------------------------------------------------------------- Pain Assessment Details Patient Name: Natalie [name] [name]. Date of Service: 10/21/2016 8:00 AM Medical Record Patient Account Number: [id number] Number: Treating RN: Afful, RN, BSN, [place] Sink 07-21-1933 (82 y.o. Other Clinician: Date of Birth/Sex: Female) Treating ROBSON, MICHAEL Primary Care Ivette Castronova: [name] Arhan Mcmanamon/Extender: G Referring Shonte Beutler: Charolotte Capuchin in Treatment: 27 Active Problems Location of Pain Severity and Description of Pain Patient Has Paino No Site Locations With Dressing Change: No Pain Management and Medication Current Pain Management: Electronic Signature(s) Signed: 10/21/2016 5:08:53 PM By: Elpidio Eric BSN, RN Entered By: Elpidio Eric on 10/21/2016 08:13:56 Natalie [name] [name] ([id number]) -------------------------------------------------------------------------------- Patient/Caregiver Education Details Patient Name: Natalie [name] [name] Date of Service: 10/21/2016 8:00 AM Medical Record Patient Account Number: [id number] Number: Treating RN: Afful, RN, BSN, [place] Sink August 02, 1933 (82 y.o. Other Clinician: Date of Birth/Gender: Female) Treating ROBSON, MICHAEL Primary Care Physician: [name] Physician/Extender: G Referring Physician: Charolotte Capuchin in Treatment: 104 Education Assessment Education Provided To: Patient Education Topics Provided Wound/Skin Impairment: Methods: Explain/Verbal Responses: State content correctly Electronic Signature(s) Signed: 10/21/2016 5:08:53 PM By: Elpidio Eric BSN, RN Entered By: Elpidio Eric on 10/21/2016 08:34:56 Natalie [name] [name] ([id number]) -------------------------------------------------------------------------------- Wound Assessment Details Patient Name: Natalie [name] [name]. Date  of Service: 10/21/2016 8:00 AM Medical Record Patient Account Number: [id number] Number: Treating RN: Afful, RN, BSN, [place] Sink June 20, 1933 (82 y.o. Other Clinician: Date of Birth/Sex: Female) Treating ROBSON, MICHAEL Primary Care Ferry Matthis: [name] Lynita [place]/Extender: G Referring Desiderio Dolata: Charolotte Capuchin in Treatment: 44 Wound Status Wound Number: 20 Primary Atypical Etiology: Wound Location: Right Toe Great Wound Open Wounding Event: Gradually Appeared Status: Date Acquired: 09/09/2016 Comorbid Arrhythmia, Congestive Heart Failure, Weeks Of Treatment: 6 History: Hypertension, Type II Diabetes Clustered Wound: No Photos Photo Uploaded By: Elpidio Eric on 10/21/2016 17:04:57 Wound Measurements Length: (cm) 1.8 Width: (cm) 1.5 Depth: (cm) 0.1 Area: (cm) 2.121 Volume: (cm) 0.212 % Reduction in Area: -275.4% % Reduction in Volume: -271.9% Epithelialization: None Tunneling: No Undermining: No Wound Description Classification: Partial Thickness Diabetic Severity [address]): Grade 1 Wound Margin: Distinct, outline YURIKO, PORTALES ([id number]) Foul Odor After Cleansing: No Slough/Fibrino No d Exudate Amount: Large Exudate Type: Serous Exudate Color: amber Wound Bed Granulation Amount: None Present (0%) Exposed Structure Necrotic Amount: Large (67-100%) Fascia Exposed: No Necrotic Quality: Adherent Slough Fat Layer (Subcutaneous Tissue) Exposed: Yes Tendon Exposed: No Muscle Exposed: No Joint Exposed: No Bone Exposed: No Periwound Skin Texture Texture Color No Abnormalities Noted: No No Abnormalities Noted: No Callus: No Atrophie Blanche: No Crepitus: No Cyanosis: No Excoriation: No Ecchymosis: No Induration: No Erythema: No Rash: No Hemosiderin Staining: No Scarring: No Mottled: No Pallor: No Moisture Rubor: No No Abnormalities Noted: No Dry / Scaly: No Temperature / Pain Maceration: No Temperature: No  Abnormality Tenderness on Palpation: Yes Wound Preparation Ulcer Cleansing: Rinsed/Irrigated with Saline Topical Anesthetic Applied: Other: lidocaine 4%, Treatment Notes Wound #20 (Right Toe Great) 1. Cleansed with: Clean wound with Normal Saline 4. Dressing Applied: Santyl Ointment 5. Secondary Dressing Applied Dry Gauze Foam Kerlix/Conform 6. Footwear/Offloading  device applied Other footwear/offloading device applied (specify in notes) 7. Secured with Tape FATHIMA, BARTL ([id number]) Notes [facility] Electronic Signature(s) Signed: 10/21/2016 5:08:53 PM By: Elpidio Eric BSN, RN Entered By: Elpidio Eric on 10/21/2016 08:20:40 Natalie [name] [name] ([id number]) -------------------------------------------------------------------------------- Vitals Details Patient Name: Natalie [name] [name] Date of Service: 10/21/2016 8:00 AM Medical Record Patient Account Number: [id number] Number: Treating RN: Afful, RN, BSN, [place] Sink 11-26-1933 (82 y.o. Other Clinician: Date of Birth/Sex: Female) Treating ROBSON, MICHAEL Primary Care Jesicca Dipierro: [name] Amillia Biffle/Extender: G Referring Seirra Kos: Charolotte Capuchin in Treatment: 50 Vital Signs Time Taken: 08:12 Temperature (F): 97.5 Height (in): 65 Pulse (bpm): 75 Weight (lbs): 160 Respiratory Rate (breaths/min): 17 Body Mass Index (BMI): 26.6 Blood Pressure (mmHg): 116/51 Reference Range: 80 - 120 mg / dl Electronic Signature(s) Signed: 10/21/2016 5:08:53 PM By: Elpidio Eric BSN, RN Entered By: Elpidio Eric on 10/21/2016 08:14:21

## 2016-10-27 ENCOUNTER — Encounter
Admission: RE | Admit: 2016-10-27 | Discharge: 2016-10-27 | Disposition: A | Discharge status: Home / Self Care | Payer: Medicare Other | Source: Ambulatory Visit | Attending: Vascular Surgery | Admitting: Vascular Surgery

## 2016-10-28 MED ORDER — CEFAZOLIN SODIUM-DEXTROSE 1-4 GM/50ML-% IV SOLN
1.0000 g | Freq: Once | INTRAVENOUS | Status: AC
  Administered 2016-10-29: 1 g via INTRAVENOUS

## 2016-10-29 ENCOUNTER — Encounter
Admission: RE | Disposition: A | Discharge status: Home / Self Care | Payer: Self-pay | Source: Ambulatory Visit | Attending: Vascular Surgery

## 2016-10-29 ENCOUNTER — Ambulatory Visit
Admission: RE | Admit: 2016-10-29 | Discharge: 2016-10-29 | Disposition: A | Discharge status: Home / Self Care | Payer: Medicare Other | Source: Ambulatory Visit | Attending: Vascular Surgery | Admitting: Vascular Surgery

## 2016-10-29 DIAGNOSIS — Z800 Family history of malignant neoplasm of digestive organs: Secondary | ICD-10-CM | POA: Insufficient documentation

## 2016-10-29 DIAGNOSIS — Z888 Allergy status to other drugs, medicaments and biological substances status: Secondary | ICD-10-CM | POA: Insufficient documentation

## 2016-10-29 DIAGNOSIS — Z9841 Cataract extraction status, right eye: Secondary | ICD-10-CM | POA: Insufficient documentation

## 2016-10-29 DIAGNOSIS — L97519 Non-pressure chronic ulcer of other part of right foot with unspecified severity: Secondary | ICD-10-CM | POA: Diagnosis not present

## 2016-10-29 DIAGNOSIS — I7025 Atherosclerosis of native arteries of other extremities with ulceration: Secondary | ICD-10-CM | POA: Diagnosis present

## 2016-10-29 DIAGNOSIS — E11621 Type 2 diabetes mellitus with foot ulcer: Secondary | ICD-10-CM | POA: Insufficient documentation

## 2016-10-29 DIAGNOSIS — I509 Heart failure, unspecified: Secondary | ICD-10-CM | POA: Insufficient documentation

## 2016-10-29 DIAGNOSIS — Z9889 Other specified postprocedural states: Secondary | ICD-10-CM | POA: Insufficient documentation

## 2016-10-29 DIAGNOSIS — I132 Hypertensive heart and chronic kidney disease with heart failure and with stage 5 chronic kidney disease, or end stage renal disease: Secondary | ICD-10-CM | POA: Insufficient documentation

## 2016-10-29 DIAGNOSIS — N289 Disorder of kidney and ureter, unspecified: Secondary | ICD-10-CM | POA: Insufficient documentation

## 2016-10-29 DIAGNOSIS — Z9049 Acquired absence of other specified parts of digestive tract: Secondary | ICD-10-CM | POA: Insufficient documentation

## 2016-10-29 DIAGNOSIS — Z91041 Radiographic dye allergy status: Secondary | ICD-10-CM | POA: Insufficient documentation

## 2016-10-29 DIAGNOSIS — D631 Anemia in chronic kidney disease: Secondary | ICD-10-CM | POA: Insufficient documentation

## 2016-10-29 DIAGNOSIS — I4891 Unspecified atrial fibrillation: Secondary | ICD-10-CM | POA: Diagnosis not present

## 2016-10-29 DIAGNOSIS — Z89421 Acquired absence of other right toe(s): Secondary | ICD-10-CM | POA: Insufficient documentation

## 2016-10-29 DIAGNOSIS — J90 Pleural effusion, not elsewhere classified: Secondary | ICD-10-CM | POA: Insufficient documentation

## 2016-10-29 DIAGNOSIS — Z823 Family history of stroke: Secondary | ICD-10-CM | POA: Insufficient documentation

## 2016-10-29 DIAGNOSIS — N186 End stage renal disease: Secondary | ICD-10-CM | POA: Diagnosis not present

## 2016-10-29 DIAGNOSIS — Z992 Dependence on renal dialysis: Secondary | ICD-10-CM | POA: Diagnosis not present

## 2016-10-29 DIAGNOSIS — G2581 Restless legs syndrome: Secondary | ICD-10-CM | POA: Insufficient documentation

## 2016-10-29 DIAGNOSIS — E890 Postprocedural hypothyroidism: Secondary | ICD-10-CM | POA: Insufficient documentation

## 2016-10-29 DIAGNOSIS — Z96643 Presence of artificial hip joint, bilateral: Secondary | ICD-10-CM | POA: Insufficient documentation

## 2016-10-29 DIAGNOSIS — M109 Gout, unspecified: Secondary | ICD-10-CM | POA: Diagnosis not present

## 2016-10-29 DIAGNOSIS — Z7902 Long term (current) use of antithrombotics/antiplatelets: Secondary | ICD-10-CM | POA: Insufficient documentation

## 2016-10-29 DIAGNOSIS — Z794 Long term (current) use of insulin: Secondary | ICD-10-CM | POA: Insufficient documentation

## 2016-10-29 DIAGNOSIS — E1140 Type 2 diabetes mellitus with diabetic neuropathy, unspecified: Secondary | ICD-10-CM | POA: Diagnosis not present

## 2016-10-29 DIAGNOSIS — Z9109 Other allergy status, other than to drugs and biological substances: Secondary | ICD-10-CM | POA: Diagnosis not present

## 2016-10-29 DIAGNOSIS — I70238 Atherosclerosis of native arteries of right leg with ulceration of other part of lower right leg: Secondary | ICD-10-CM | POA: Diagnosis not present

## 2016-10-29 DIAGNOSIS — Z833 Family history of diabetes mellitus: Secondary | ICD-10-CM | POA: Insufficient documentation

## 2016-10-29 DIAGNOSIS — E1122 Type 2 diabetes mellitus with diabetic chronic kidney disease: Secondary | ICD-10-CM | POA: Diagnosis not present

## 2016-10-29 DIAGNOSIS — I272 Pulmonary hypertension, unspecified: Secondary | ICD-10-CM | POA: Diagnosis not present

## 2016-10-29 DIAGNOSIS — Z885 Allergy status to narcotic agent status: Secondary | ICD-10-CM | POA: Insufficient documentation

## 2016-10-29 DIAGNOSIS — K219 Gastro-esophageal reflux disease without esophagitis: Secondary | ICD-10-CM | POA: Diagnosis not present

## 2016-10-29 DIAGNOSIS — Z8673 Personal history of transient ischemic attack (TIA), and cerebral infarction without residual deficits: Secondary | ICD-10-CM | POA: Insufficient documentation

## 2016-10-29 DIAGNOSIS — Z89422 Acquired absence of other left toe(s): Secondary | ICD-10-CM | POA: Insufficient documentation

## 2016-10-29 DIAGNOSIS — Z9842 Cataract extraction status, left eye: Secondary | ICD-10-CM | POA: Insufficient documentation

## 2016-10-29 HISTORY — PX: LOWER EXTREMITY ANGIOGRAPHY: CATH118251

## 2016-10-29 HISTORY — PX: LOWER EXTREMITY INTERVENTION: CATH118252

## 2016-10-29 LAB — GLUCOSE, CAPILLARY: GLUCOSE-CAPILLARY: 110 mg/dL — AB (ref 65–99)

## 2016-10-29 LAB — PROTIME-INR
INR: 3.97
Prothrombin Time: 39.8 seconds — ABNORMAL HIGH (ref 11.4–15.2)

## 2016-10-29 LAB — POTASSIUM (ARMC VASCULAR LAB ONLY): POTASSIUM (ARMC VASCULAR LAB): 4.6 (ref 3.5–5.1)

## 2016-10-29 SURGERY — LOWER EXTREMITY ANGIOGRAPHY
Anesthesia: Moderate Sedation | Laterality: Right

## 2016-10-29 MED ORDER — SODIUM CHLORIDE 0.9 % IV SOLN
INTRAVENOUS | Status: DC
  Administered 2016-10-29: 08:00:00 via INTRAVENOUS

## 2016-10-29 MED ORDER — LIDOCAINE-EPINEPHRINE (PF) 2 %-1:200000 IJ SOLN
INTRAMUSCULAR | Status: AC
  Filled 2016-10-29: qty 20

## 2016-10-29 MED ORDER — LABETALOL HCL 5 MG/ML IV SOLN: 10.0000 mg | INTRAVENOUS | Status: DC | PRN

## 2016-10-29 MED ORDER — MIDAZOLAM HCL 2 MG/2ML IJ SOLN
INTRAMUSCULAR | Status: DC | PRN
  Administered 2016-10-29 (×2): 1 mg via INTRAVENOUS
  Administered 2016-10-29: 2 mg via INTRAVENOUS

## 2016-10-29 MED ORDER — GUAIFENESIN-DM 100-10 MG/5ML PO SYRP: 15.0000 mL | ORAL_SOLUTION | ORAL | Status: DC | PRN

## 2016-10-29 MED ORDER — METHYLPREDNISOLONE SODIUM SUCC 125 MG IJ SOLR
125.0000 mg | INTRAMUSCULAR | Status: DC | PRN
  Administered 2016-10-29: 125 mg via INTRAVENOUS

## 2016-10-29 MED ORDER — ONDANSETRON HCL 4 MG/2ML IJ SOLN: 4.0000 mg | Freq: Four times a day (QID) | INTRAMUSCULAR | Status: DC | PRN

## 2016-10-29 MED ORDER — FENTANYL CITRATE (PF) 100 MCG/2ML IJ SOLN
INTRAMUSCULAR | Status: AC
  Filled 2016-10-29: qty 2

## 2016-10-29 MED ORDER — HYDRALAZINE HCL 20 MG/ML IJ SOLN: 5.0000 mg | INTRAMUSCULAR | Status: DC | PRN

## 2016-10-29 MED ORDER — FAMOTIDINE 20 MG PO TABS
40.0000 mg | ORAL_TABLET | ORAL | Status: DC | PRN
  Administered 2016-10-29: 40 mg via ORAL

## 2016-10-29 MED ORDER — SODIUM CHLORIDE 0.9 % IV SOLN: 500.0000 mL | Freq: Once | INTRAVENOUS | Status: DC | PRN

## 2016-10-29 MED ORDER — FAMOTIDINE 20 MG PO TABS
ORAL_TABLET | ORAL | Status: AC
  Filled 2016-10-29: qty 2

## 2016-10-29 MED ORDER — METOPROLOL TARTRATE 5 MG/5ML IV SOLN: 2.0000 mg | INTRAVENOUS | Status: DC | PRN

## 2016-10-29 MED ORDER — HYDROMORPHONE HCL 1 MG/ML IJ SOLN: 1.0000 mg | Freq: Once | INTRAMUSCULAR | Status: DC | PRN

## 2016-10-29 MED ORDER — METHYLPREDNISOLONE SODIUM SUCC 125 MG IJ SOLR
INTRAMUSCULAR | Status: AC
  Filled 2016-10-29: qty 2

## 2016-10-29 MED ORDER — ACETAMINOPHEN 325 MG PO TABS: 325.0000 mg | ORAL_TABLET | ORAL | Status: DC | PRN

## 2016-10-29 MED ORDER — ACETAMINOPHEN 325 MG RE SUPP
325.0000 mg | RECTAL | Status: DC | PRN
  Filled 2016-10-29: qty 2

## 2016-10-29 MED ORDER — PHENOL 1.4 % MT LIQD: 1.0000 | OROMUCOSAL | Status: DC | PRN

## 2016-10-29 MED ORDER — OXYCODONE-ACETAMINOPHEN 5-325 MG PO TABS: 1.0000 | ORAL_TABLET | ORAL | Status: DC | PRN

## 2016-10-29 MED ORDER — CEFAZOLIN SODIUM-DEXTROSE 1-4 GM/50ML-% IV SOLN
INTRAVENOUS | Status: AC
  Filled 2016-10-29: qty 50

## 2016-10-29 MED ORDER — FENTANYL CITRATE (PF) 100 MCG/2ML IJ SOLN
INTRAMUSCULAR | Status: DC | PRN
  Administered 2016-10-29 (×2): 25 ug via INTRAVENOUS
  Administered 2016-10-29: 50 ug via INTRAVENOUS

## 2016-10-29 MED ORDER — IOPAMIDOL (ISOVUE-300) INJECTION 61%
INTRAVENOUS | Status: DC | PRN
  Administered 2016-10-29: 50 mL via INTRA_ARTERIAL

## 2016-10-29 MED ORDER — MIDAZOLAM HCL 5 MG/5ML IJ SOLN
INTRAMUSCULAR | Status: AC
  Filled 2016-10-29: qty 5

## 2016-10-29 MED ORDER — HYDROMORPHONE HCL 1 MG/ML IJ SOLN: 0.5000 mg | INTRAMUSCULAR | Status: DC | PRN

## 2016-10-29 SURGICAL SUPPLY — 20 items
BALLN LUTONIX DCB 5X80X130 (BALLOONS) ×4
BALLN ULTRVRSE 3X300X150 (BALLOONS) ×2
BALLN ULTRVRSE 3X300X150 OTW (BALLOONS) ×2
BALLOON LUTONIX DCB 5X80X130 (BALLOONS) ×2 IMPLANT
BALLOON ULTRVRSE 3X300X150 OTW (BALLOONS) ×2 IMPLANT
CATH BEACON 5 .038 100 VERT TP (CATHETERS) ×4 IMPLANT
CATH CXI SUPP ANG 4FR 135 (MICROCATHETER) ×2 IMPLANT
CATH CXI SUPP ANG 4FR 135CM (MICROCATHETER) ×4
CATH IMAGER II S 5FR 65CM (MISCELLANEOUS) ×4 IMPLANT
COVER PROBE U/S 5X48 (MISCELLANEOUS) ×4 IMPLANT
DEVICE PRESTO INFLATION (MISCELLANEOUS) ×4 IMPLANT
DEVICE STARCLOSE SE CLOSURE (Vascular Products) ×4 IMPLANT
GLIDEWIRE ADV .035X260CM (WIRE) ×4 IMPLANT
PACK ANGIOGRAPHY (CUSTOM PROCEDURE TRAY) ×4 IMPLANT
SHEATH ANL2 6FRX45 HC (SHEATH) ×4 IMPLANT
SHEATH BRITE TIP 5FRX11 (SHEATH) ×4 IMPLANT
SYR MEDRAD MARK V 150ML (SYRINGE) ×4 IMPLANT
TUBING CONTRAST HIGH PRESS 72 (TUBING) ×4 IMPLANT
WIRE G V18X300CM (WIRE) ×4 IMPLANT
WIRE J 3MM .035X145CM (WIRE) ×4 IMPLANT

## 2016-10-29 NOTE — H&P (Signed)
[facility] History & Physical Update  The patient was interviewed and re-examined.  The patient's previous History and Physical has been reviewed and is unchanged.  There is no change in the plan of care. We plan to proceed with the scheduled procedure.  Festus BarrenJason Isayah Ignasiak, MD  10/29/2016, 8:51 AM

## 2016-10-29 NOTE — Progress Notes (Signed)
INR resulted:3.97, Dr. Wyn Quakerew notified now. No orders at present.

## 2016-10-29 NOTE — Op Note (Signed)
[facility] Percutaneous Study/Intervention Procedural Note   Date of Surgery: 10/29/2016  Surgeon(s):DEW,JASON   Assistants:none  Pre-operative Diagnosis: PAD with ulceration right lower extremity  Post-operative diagnosis: Same  Procedure(s) Performed: 1. Ultrasound guidance for vascular access left femoral artery 2. Catheter placement into right posterior tibial artery from left femoral approach 3. Aortogram and selective right lower extremity angiogram 4. Percutaneous transluminal angioplasty of right popliteal artery with 5 mm diameter by 8 cm length Lutonix drug-coated angioplasty balloon 5. Percutaneous transluminal angioplasty of the right posterior tibial artery with 3 mm diameter by 30 cm length angioplasty balloon  6.  StarClose closure device left femoral artery  EBL: Minimal  Contrast: 50 cc  Fluoro Time: 5.9 minutes  Moderate Conscious Sedation Time: approximately 45 minutes using 4 mg of Versed and 100 mcg of Fentanyl  Indications: Patient is a 83 y.o.female with nonhealing ulcerations of the right foot and a known history of peripheral arterial disease. The patient is brought in for angiography for further evaluation and potential treatment. Risks and benefits are discussed and informed consent is obtained  Procedure: The patient was identified and appropriate procedural time out was performed. The patient was then placed supine on the table and prepped and draped in the usual sterile fashion.Moderate conscious sedation was administered during a face to face encounter with the patient throughout the procedure with my supervision of the RN administering medicines and monitoring the patient's vital signs, pulse oximetry, telemetry and mental status throughout from the start of the procedure until the patient was taken to the recovery room. Ultrasound was used to  evaluate the left common femoral artery. It was patent. A digital ultrasound image was acquired. A Seldinger needle was used to access the left common femoral artery under direct ultrasound guidance and a permanent image was performed. A 0.035 J wire was advanced without resistance and a 5Fr sheath was placed. Pigtail catheter was placed into the aorta and an AP aortogram was performed. This demonstrated normal renal arteries and normal aorta and iliac segments without significant stenosis. I then crossed the aortic bifurcation and advanced to the right femoral head. Selective right lower extremity angiogram was then performed. This demonstrated normal common femoral artery, profunda femoris artery, and superficial femoral artery with calcification but no stenosis. The popliteal artery then had a focal stenosis at the level of the knee of about 75-80%. There was then a normal tibial trifurcation with an anterior tibial artery which appeared continuous to the foot. The peroneal artery was small and did not contribute much flow distally. The posterior tibial artery also had flow into the foot, but had 2 areas of stenosis one in the proximal segment and then one in the mid segment that were both in the 70-80% range. The patient was systemically heparinized and a 6 [place] Ansell sheath was then placed over the [employer] wire. I then used a Kumpe catheter and the advantage wire to navigate down into the popliteal artery below the stenosis and confirm intraluminal flow. I then exchanged for a CXI catheter and a 0.018 wire was able to injure the posterior tibial artery. Across the stenosis with minimal difficulty and parked the wire in the foot. The catheter was then removed from the posterior tibial artery and I proceeded with treatment. A 3 mm diameter by 30 cm length angioplasty balloon was used to treat the posterior tibial artery from the mid to distal segment up through the tibioperoneal trunk. This was  inflated to  8 atm for 1 minute. Several areas of tight waist were seen which resolved with inflation. I then turned my attention to the popliteal artery. A 5 mm diameter by 8 cm length Lutonix drug-coated angioplasty balloon is inflated to 10 atm for 1 minute in the popliteal artery. Completion angiogram showed only about a 15-20% residual stenosis in the popliteal artery and less than 10% residual stenosis in the posterior tibial artery with the posterior tibial artery now being the most brisk flow to the foot. I elected to terminate the procedure. The sheath was removed and StarClose closure device was deployed in the left femoral artery with excellent hemostatic result. The patient was taken to the recovery room in stable condition having tolerated the procedure well.  Findings:  Aortogram: Normal aorta and iliac arteries with no significant stenosis.  Sluggish renal artery flow but no obvious stenosis. Right Lower Extremity: This demonstrated normal common femoral artery, profunda femoris artery, and superficial femoral artery with calcification but no stenosis. The popliteal artery then had a focal stenosis at the level of the knee of about 75-80%. There was then a normal tibial trifurcation with an anterior tibial artery which appeared continuous to the foot. The peroneal artery was small and did not contribute much flow distally. The posterior tibial artery also had flow into the foot, but had 2 areas of stenosis one in the proximal segment and then one in the mid segment that were both in the 70-80% range   Disposition: Patient was taken to the recovery room in stable condition having tolerated the procedure well.  Complications: None  Leotis Pain 10/29/2016 10:52 AM   This note was created with Dragon Medical transcription system. Any errors in dictation are purely unintentional.

## 2016-11-03 ENCOUNTER — Other Ambulatory Visit: Payer: Self-pay | Admitting: Infectious Diseases

## 2016-11-03 DIAGNOSIS — M4646 Discitis, unspecified, lumbar region: Secondary | ICD-10-CM

## 2016-11-04 ENCOUNTER — Encounter: Payer: Medicare Other | Attending: Internal Medicine | Admitting: Internal Medicine

## 2016-11-04 DIAGNOSIS — Z992 Dependence on renal dialysis: Secondary | ICD-10-CM | POA: Insufficient documentation

## 2016-11-04 DIAGNOSIS — N186 End stage renal disease: Secondary | ICD-10-CM | POA: Insufficient documentation

## 2016-11-04 DIAGNOSIS — E11621 Type 2 diabetes mellitus with foot ulcer: Secondary | ICD-10-CM | POA: Insufficient documentation

## 2016-11-04 DIAGNOSIS — Z794 Long term (current) use of insulin: Secondary | ICD-10-CM | POA: Diagnosis not present

## 2016-11-04 DIAGNOSIS — Z8673 Personal history of transient ischemic attack (TIA), and cerebral infarction without residual deficits: Secondary | ICD-10-CM | POA: Diagnosis not present

## 2016-11-04 DIAGNOSIS — I132 Hypertensive heart and chronic kidney disease with heart failure and with stage 5 chronic kidney disease, or end stage renal disease: Secondary | ICD-10-CM | POA: Insufficient documentation

## 2016-11-04 DIAGNOSIS — I5033 Acute on chronic diastolic (congestive) heart failure: Secondary | ICD-10-CM | POA: Diagnosis not present

## 2016-11-04 DIAGNOSIS — I482 Chronic atrial fibrillation: Secondary | ICD-10-CM | POA: Diagnosis not present

## 2016-11-04 DIAGNOSIS — E1122 Type 2 diabetes mellitus with diabetic chronic kidney disease: Secondary | ICD-10-CM | POA: Insufficient documentation

## 2016-11-04 DIAGNOSIS — L97512 Non-pressure chronic ulcer of other part of right foot with fat layer exposed: Secondary | ICD-10-CM | POA: Insufficient documentation

## 2016-11-04 DIAGNOSIS — E1151 Type 2 diabetes mellitus with diabetic peripheral angiopathy without gangrene: Secondary | ICD-10-CM | POA: Insufficient documentation

## 2016-11-05 ENCOUNTER — Ambulatory Visit
Admission: RE | Admit: 2016-11-05 | Discharge: 2016-11-05 | Disposition: A | Discharge status: Home / Self Care | Payer: Medicare Other | Source: Ambulatory Visit | Attending: Infectious Diseases | Admitting: Infectious Diseases

## 2016-11-05 DIAGNOSIS — M4646 Discitis, unspecified, lumbar region: Secondary | ICD-10-CM | POA: Insufficient documentation

## 2016-11-05 NOTE — Progress Notes (Addendum)
INELLA, KUWAHARA ([id number]) Visit Report for 11/04/2016 Arrival Information Details Patient Name: SAJE, GALLOP. Date of Service: 11/04/2016 8:00 AM Medical Record Patient Account Number: [id number] Number: Treating RN: Phillis Haggis [birth year] (83 y.o. Other Clinician: Date of Birth/Sex: Female) Treating ROBSON, MICHAEL Primary Care Jaretzi Droz: [name] Gracelynn Bircher/Extender: G Referring Zillah Alexie: Charolotte Capuchin in Treatment: 32 Visit Information History Since Last Visit All ordered tests and consults were completed: No Patient Arrived: Wheel Chair Added or deleted any medications: No Arrival Time: 08:01 Any new allergies or adverse reactions: No Accompanied By: daughter Had a fall or experienced change in No Transfer Assistance: None activities of daily living that may affect Patient Identification Verified: Yes risk of falls: Secondary Verification Process Yes Signs or symptoms of abuse/neglect since last No Completed: visito Patient Requires Transmission- No Hospitalized since last visit: No Based Precautions: Has Dressing in Place as Prescribed: Yes Patient Has Alerts: Yes Pain Present Now: Yes Patient Alerts: Patient on Blood Thinner DMII Warfarin ABI [place] Bilateral NO BP RIGHT ARM Dialysis Pt Electronic Signature(s) Signed: 11/04/2016 5:14:09 PM By: Alejandro Mulling Entered By: Alejandro Mulling on 11/04/2016 08:06:18 [name] ([id number]) -------------------------------------------------------------------------------- Clinic Level of Care Assessment Details Patient Name: [name] Date of Service: 11/04/2016 8:00 AM Medical Record Patient Account Number: [id number] Number: Treating RN: Phillis Haggis February 16, 1934 (83 y.o. Other Clinician: Date of Birth/Sex: Female) Treating ROBSON, MICHAEL Primary Care Kiira Brach: [name] Dmarco Baldus/Extender: G Referring Saida Lonon: Charolotte Capuchin in Treatment: 51 Clinic  Level of Care Assessment Items TOOL 4 Quantity Score X - Use when only an EandM is performed on FOLLOW-UP visit 1 0 ASSESSMENTS - Nursing Assessment / Reassessment X - Reassessment of Co-morbidities (includes updates in patient status) 1 10 X - Reassessment of Adherence to Treatment Plan 1 5 ASSESSMENTS - Wound and Skin Assessment / Reassessment X - Simple Wound Assessment / Reassessment - one wound 1 5 []  - Complex Wound Assessment / Reassessment - multiple wounds 0 []  - Dermatologic / Skin Assessment (not related to wound area) 0 ASSESSMENTS - Focused Assessment []  - Circumferential Edema Measurements - multi extremities 0 []  - Nutritional Assessment / Counseling / Intervention 0 []  - Lower Extremity Assessment (monofilament, tuning fork, pulses) 0 []  - Peripheral Arterial Disease Assessment (using hand held doppler) 0 ASSESSMENTS - Ostomy and/or Continence Assessment and Care []  - Incontinence Assessment and Management 0 []  - Ostomy Care Assessment and Management (repouching, etc.) 0 PROCESS - Coordination of Care X - Simple Patient / Family Education for ongoing care 1 15 []  - Complex (extensive) Patient / Family Education for ongoing care 0 []  - Staff obtains Chiropractor, Records, Test Results / Process Orders 0 []  - Staff telephones HHA, Nursing Homes / Clarify orders / etc 0 CORALINE, TALWAR ([id number]) []  - Routine Transfer to another Facility (non-emergent condition) 0 []  - Routine Hospital Admission (non-emergent condition) 0 []  - New Admissions / Manufacturing engineer / Ordering NPWT, Apligraf, etc. 0 []  - Emergency Hospital Admission (emergent condition) 0 X - Simple Discharge Coordination 1 10 []  - Complex (extensive) Discharge Coordination 0 PROCESS - Special Needs []  - Pediatric / Minor Patient Management 0 []  - Isolation Patient Management 0 []  - Hearing / Language / Visual special needs 0 []  - Assessment of Community assistance (transportation, D/C planning, etc.)  0 []  - Additional assistance / Altered mentation 0 []  - Support Surface(s) Assessment (bed, cushion, seat, etc.) 0 INTERVENTIONS - Wound Cleansing / Measurement X - Simple Wound  Cleansing - one wound 1 5 []  - Complex Wound Cleansing - multiple wounds 0 X - Wound Imaging (photographs - any number of wounds) 1 5 []  - Wound Tracing (instead of photographs) 0 X - Simple Wound Measurement - one wound 1 5 []  - Complex Wound Measurement - multiple wounds 0 INTERVENTIONS - Wound Dressings X - Small Wound Dressing one or multiple wounds 1 10 []  - Medium Wound Dressing one or multiple wounds 0 []  - Large Wound Dressing one or multiple wounds 0 X - Application of Medications - topical 1 5 []  - Application of Medications - injection 0 Helane RimaHOPKINS, Lanett Y. ([id number]) INTERVENTIONS - Miscellaneous []  - External ear exam 0 []  - Specimen Collection (cultures, biopsies, blood, body fluids, etc.) 0 []  - Specimen(s) / Culture(s) sent or taken to Lab for analysis 0 []  - Patient Transfer (multiple staff / Michiel SitesHoyer Lift / Similar devices) 0 []  - Simple Staple / Suture removal (25 or less) 0 []  - Complex Staple / Suture removal (26 or more) 0 []  - Hypo / Hyperglycemic Management (close monitor of Blood Glucose) 0 []  - Ankle / Brachial Index (ABI) - do not check if billed separately 0 X - Vital Signs 1 5 Has the patient been seen at the hospital within the last three years: Yes Total Score: 80 Level Of Care: New/Established - Level 3 Electronic Signature(s) Signed: 11/04/2016 5:14:09 PM By: Alejandro MullingPinkerton, Debra Entered By: Alejandro MullingPinkerton, Debra on 11/04/2016 10:30:44 Helane RimaHOPKINS, Sundi Y. ([id number]) -------------------------------------------------------------------------------- Encounter Discharge Information Details Patient Name: Helane RimaHOPKINS, Elim Y. Date of Service: 11/04/2016 8:00 AM Medical Record Patient Account Number: [id number] Number: Treating RN: Phillis Haggisinkerton, Debi [birth year] (83 y.o. Other  Clinician: Date of Birth/Sex: Female) Treating ROBSON, MICHAEL Primary Care Lulie Hurd: Rolm GalaGrandis, Heidi Mallorie Norrod/Extender: G Referring Nakima Fluegge: Charolotte CapuchinGrandis, Heidi Weeks in Treatment: 7246 Encounter Discharge Information Items Discharge Pain Level: 10 Discharge Condition: Stable Ambulatory Status: Wheelchair Discharge Destination: Home Transportation: Private Auto Accompanied By: daughter Schedule Follow-up Appointment: Yes Medication Reconciliation completed No and provided to Patient/Care Jakaya Jacobowitz: Patient Clinical Summary of Care: Declined Electronic Signature(s) Signed: 11/04/2016 8:25:56 AM By: Francie MassingKelly, Tia Entered By: Francie MassingKelly, Tia on 11/04/2016 08:25:56 Helane RimaHOPKINS, Milessa Y. ([id number]) -------------------------------------------------------------------------------- Lower Extremity Assessment Details Patient Name: Helane RimaHOPKINS, Ellaree Y. Date of Service: 11/04/2016 8:00 AM Medical Record Patient Account Number: [id number] Number: Treating RN: Phillis Haggisinkerton, Debi [birth year] (83 y.o. Other Clinician: Date of Birth/Sex: Female) Treating ROBSON, MICHAEL Primary Care Talajah Slimp: Rolm GalaGrandis, Heidi Cornelia Walraven/Extender: G Referring Sael Furches: Charolotte CapuchinGrandis, Heidi Weeks in Treatment: 46 Vascular Assessment Pulses: Dorsalis Pedis Palpable: [Right:Yes] Posterior Tibial Extremity colors, hair growth, and conditions: Extremity Color: [Right:Mottled] Temperature of Extremity: [Right:Warm] Capillary Refill: [Right:< 3 seconds] Electronic Signature(s) Signed: 11/04/2016 5:14:09 PM By: Alejandro MullingPinkerton, Debra Entered By: Alejandro MullingPinkerton, Debra on 11/04/2016 08:10:02 Helane RimaHOPKINS, Alazne Y. ([id number]) -------------------------------------------------------------------------------- Multi Wound Chart Details Patient Name: Helane RimaHOPKINS, Simrin Y. Date of Service: 11/04/2016 8:00 AM Medical Record Patient Account Number: [id number] Number: Treating RN: Phillis Haggisinkerton, Debi [birth year] (83 y.o. Other Clinician: Date of  Birth/Sex: Female) Treating ROBSON, MICHAEL Primary Care Hurman Ketelsen: Rolm GalaGrandis, Heidi Willies Laviolette/Extender: G Referring Mckaela Howley: Charolotte CapuchinGrandis, Heidi Weeks in Treatment: 446 Vital Signs Height(in): 65 Pulse(bpm): 70 Weight(lbs): 160 Blood Pressure 112/53 (mmHg): Body Mass Index(BMI): 27 Temperature(F): Respiratory Rate 17 (breaths/min): Photos: [20:No Photos] [N/A:N/A] Wound Location: [20:Right Toe Great] [N/A:N/A] Wounding Event: [20:Gradually Appeared] [N/A:N/A] Primary Etiology: [20:Atypical] [N/A:N/A] Comorbid History: [20:Arrhythmia, Congestive Heart Failure, Hypertension, Type II Diabetes] [N/A:N/A] Date Acquired: [20:09/09/2016] [N/A:N/A] Weeks of Treatment: [20:8] [N/A:N/A] Wound Status: [20:Open] [N/A:N/A] Measurements L x W x  D 1.4x1.3x0.1 [N/A:N/A] (cm) Area (cm) : [20:1.429] [N/A:N/A] Volume (cm) : [20:0.143] [N/A:N/A] % Reduction in Area: [20:-152.90%] [N/A:N/A] % Reduction in Volume: -150.90% [N/A:N/A] Classification: [20:Partial Thickness] [N/A:N/A] HBO Classification: [20:Grade 1] [N/A:N/A] Exudate Amount: [20:Large] [N/A:N/A] Exudate Type: [20:Serous] [N/A:N/A] Exudate Color: [20:amber] [N/A:N/A] Wound Margin: [20:Distinct, outline attached] [N/A:N/A] Granulation Amount: [20:None Present (0%)] [N/A:N/A] Necrotic Amount: [20:Large (67-100%)] [N/A:N/A] Necrotic Tissue: [20:Eschar, Adherent Slough] [N/A:N/A] Exposed Structures: [20:Fat Layer (Subcutaneous Tissue) Exposed: Yes] [N/A:N/A] Fascia: No Tendon: No Muscle: No Joint: No Bone: No Epithelialization: None N/A N/A Periwound Skin Texture: Excoriation: No N/A N/A Induration: No Callus: No Crepitus: No Rash: No Scarring: No Periwound Skin Maceration: No N/A N/A Moisture: Dry/Scaly: No Periwound Skin Color: Atrophie Blanche: No N/A N/A Cyanosis: No Ecchymosis: No Erythema: No Hemosiderin Staining: No Mottled: No Pallor: No Rubor: No Temperature: No Abnormality N/A N/A Tenderness on Yes N/A  N/A Palpation: Wound Preparation: Ulcer Cleansing: N/A N/A Rinsed/Irrigated with Saline Topical Anesthetic Applied: Other: lidocaine 4% Treatment Notes Wound #20 (Right Toe Great) 1. Cleansed with: Clean wound with Normal Saline 2. Anesthetic Topical Lidocaine 4% cream to wound bed prior to debridement 4. Dressing Applied: Santyl Ointment 5. Secondary Dressing Applied Dry Gauze Foam Kerlix/Conform 7. Secured with Tape Notes [address] VERDEAN, MURIN ([id number]) Electronic Signature(s) Signed: 11/04/2016 4:31:20 PM By: Baltazar Najjar MD Entered By: Baltazar Najjar on 11/04/2016 08:18:53 [name] ([id number]) -------------------------------------------------------------------------------- Multi-Disciplinary Care Plan Details Patient Name: SHANQUITA, RONNING. Date of Service: 11/04/2016 8:00 AM Medical Record Patient Account Number: [id number] Number: Treating RN: Phillis Haggis Oct 25, 1933 (82 y.o. Other Clinician: Date of Birth/Sex: Female) Treating ROBSON, MICHAEL Primary Care Aiya Keach: [name] Massimo Hartland/Extender: G Referring Yvonnia Tango: Charolotte Capuchin in Treatment: 74 Active Inactive Electronic Signature(s) Signed: 11/26/2016 2:08:32 PM By: Elliot Gurney BSN, RN, CWS, Kim RN, BSN Signed: 11/27/2016 4:50:50 PM By: Alejandro Mulling Previous Signature: 11/04/2016 5:14:09 PM Version By: Alejandro Mulling Entered By: Elliot Gurney BSN, RN, CWS, Kim on 11/26/2016 14:08:29 JADAH, BOBAK ([id number]) -------------------------------------------------------------------------------- Pain Assessment Details Patient Name: KELCE, BOUTON. Date of Service: 11/04/2016 8:00 AM Medical Record Patient Account Number: [id number] Number: Treating RN: Phillis Haggis Apr 04, 1934 (82 y.o. Other Clinician: Date of Birth/Sex: Female) Treating ROBSON, MICHAEL Primary Care Emannuel Vise: [name] Eldor Conaway/Extender: G Referring Lemoyne Nestor: Charolotte Capuchin in  Treatment: 64 Active Problems Location of Pain Severity and Description of Pain Patient Has Paino Yes Site Locations Pain Location: Generalized Pain With Dressing Change: No Rate the pain. Current Pain Level: 10 Pain Management and Medication Current Pain Management: Notes back pain Electronic Signature(s) Signed: 11/04/2016 5:14:09 PM By: Alejandro Mulling Entered By: Alejandro Mulling on 11/04/2016 08:06:50 [name] ([id number]) -------------------------------------------------------------------------------- Patient/Caregiver Education Details Patient Name: [name]. Date of Service: 11/04/2016 8:00 AM Medical Record Patient Account Number: [id number] Number: Treating RN: Phillis Haggis 10/30/1933 (82 y.o. Other Clinician: Date of Birth/Gender: Female) Treating ROBSON, MICHAEL Primary Care Physician: [name] Physician/Extender: G Referring Physician: Charolotte Capuchin in Treatment: 60 Education Assessment Education Provided To: Patient Education Topics Provided Wound/Skin Impairment: Handouts: Other: change dressing as ordered Methods: Demonstration, Explain/Verbal Responses: State content correctly Electronic Signature(s) Signed: 11/04/2016 5:14:09 PM By: Alejandro Mulling Entered By: Alejandro Mulling on 11/04/2016 08:13:19 [name] ([id number]) -------------------------------------------------------------------------------- Wound Assessment Details Patient Name: [name] Date of Service: 11/04/2016 8:00 AM Medical Record Patient Account Number: [id number] Number: Treating RN: Phillis Haggis 06/04/1933 (82 y.o. Other Clinician: Date of Birth/Sex: Female) Treating ROBSON, MICHAEL Primary Care Lillian Tigges: [name]  Meosha Castanon/Extender: G Referring Gulianna Hornsby: Charolotte Capuchin in Treatment: 46 Wound Status Wound Number: 20 Primary Atypical Etiology: Wound Location: Right Toe Great Wound  Open Wounding Event: Gradually Appeared Status: Date Acquired: 09/09/2016 Comorbid Arrhythmia, Congestive Heart Failure, Weeks Of Treatment: 8 History: Hypertension, Type II Diabetes Clustered Wound: No Photos Photo Uploaded By: Alejandro Mulling on 11/04/2016 16:49:42 Wound Measurements Length: (cm) 1.4 Width: (cm) 1.3 Depth: (cm) 0.1 Area: (cm) 1.429 Volume: (cm) 0.143 % Reduction in Area: -152.9% % Reduction in Volume: -150.9% Epithelialization: None Tunneling: No Undermining: No Wound Description Classification: Partial Thickness Diabetic Severity (Wagner): Grade 1 Wound Margin: Distinct, outline attach Exudate Amount: Large Exudate Type: Serous Exudate Color: amber Foul Odor After Cleansing: No Slough/Fibrino No ed Wound Bed Granulation Amount: None Present (0%) Exposed Structure Necrotic Amount: Large (67-100%) Fascia Exposed: No TAMERIA, PATTI ([id number]) Necrotic Quality: Eschar, Adherent Slough Fat Layer (Subcutaneous Tissue) Exposed: Yes Tendon Exposed: No Muscle Exposed: No Joint Exposed: No Bone Exposed: No Periwound Skin Texture Texture Color No Abnormalities Noted: No No Abnormalities Noted: No Callus: No Atrophie Blanche: No Crepitus: No Cyanosis: No Excoriation: No Ecchymosis: No Induration: No Erythema: No Rash: No Hemosiderin Staining: No Scarring: No Mottled: No Pallor: No Moisture Rubor: No No Abnormalities Noted: No Dry / Scaly: No Temperature / Pain Maceration: No Temperature: No Abnormality Tenderness on Palpation: Yes Wound Preparation Ulcer Cleansing: Rinsed/Irrigated with Saline Topical Anesthetic Applied: Other: lidocaine 4%, Electronic Signature(s) Signed: 11/04/2016 5:14:09 PM By: Alejandro Mulling Entered By: Alejandro Mulling on 11/04/2016 08:09:41 [name] ([id number]) -------------------------------------------------------------------------------- Vitals Details Patient Name: [name] Date  of Service: 11/04/2016 8:00 AM Medical Record Patient Account Number: [id number] Number: Treating RN: Phillis Haggis [birth year] (82 y.o. Other Clinician: Date of Birth/Sex: Female) Treating ROBSON, MICHAEL Primary Care Hildreth Orsak: [name] Jomo Forand/Extender: G Referring Jakevious Hollister: Charolotte Capuchin in Treatment: 74 Vital Signs Time Taken: 08:07 Pulse (bpm): 70 Height (in): 65 Respiratory Rate (breaths/min): 17 Weight (lbs): 160 Blood Pressure (mmHg): 112/53 Body Mass Index (BMI): 26.6 Reference Range: 80 - 120 mg / dl Electronic Signature(s) Signed: 11/04/2016 5:14:09 PM By: Alejandro Mulling Entered By: Alejandro Mulling on 11/04/2016 08:07:05

## 2016-11-05 NOTE — Progress Notes (Signed)
[name] ([id number]) Visit Report for 11/04/2016 Chief Complaint Document Details Patient Name: [name], [name]. Date of Service: 11/04/2016 8:00 AM Medical Record Patient Account Number: [id number] Number: Treating RN: Phillis Haggis 09-Apr-1934 (82 [name] Other Clinician: Date of Birth/Sex: Female) Treating Jaquelyn Sakamoto Primary Care Provider: [name] Provider/Extender: G Referring Provider: Charolotte Capuchin in Treatment: 65 Information Obtained from: Patient Chief Complaint Ms. Phebus presents today for follow-up evaluation of her diabetic foot ulcers and abdominal wound. Electronic Signature(s) Signed: 11/04/2016 4:31:20 PM By: Baltazar Najjar MD Entered By: Baltazar Najjar on 11/04/2016 08:19:13 [name] ([id number]) -------------------------------------------------------------------------------- HPI Details Patient Name: [name]. Date of Service: 11/04/2016 8:00 AM Medical Record Patient Account Number: [id number] Number: Treating RN: Phillis Haggis 1934-05-19 (82 [name] Other Clinician: Date of Birth/Sex: Female) Treating Genesee Nase Primary Care Provider: [name] Provider/Extender: G Referring Provider: Charolotte Capuchin in Treatment: 30 History of Present Illness Location: right posterior heel, right Achilles, right lower quadrant abdomen, right fourth toe amp site Quality: denies pain to any wound Severity: not applicable Timing: denies pain HPI Description: 83 year old patient who is known to be diabetic, was referred to [place] by Dr. Gavin Potters for a right heel ulceration which she's had for a while. She was recently in hospital for a pneumonia and at that time and got delirious and was disoriented and sometime during this time developed a stage II ulcer on her right heel. Her past medical history is significant for bilateral pneumonia which was treated with injectable antibiotics and then to oral Levaquin  which he has completed. She also has acute on chronic diastolic CHF, acute on chronic respiratory failure, end-stage renal disease on hemodialysis, atrial fibrillation, recent stroke, diabetes mellitus. The patient and her son are poor historians but from what I understand she was admitted to the hospital with an acute vascular compromise of her right lower extremity and Dr. Wyn Quaker has done a surgical procedure and we are trying to obtain these notes. There are also some vascular workup done and we will try and obtain these notes. the injury to the left lower quadrant of abdomen and the suprapubic area have been there due to a bruise and have been there for several months and no intervention has been done. 10/11/2015 -- on review of the electronics records it was noted that the patient was admitted to the hospital on 09/14/2015 with peripheral vascular disease with claudication, end-stage renal disease, pressure ulcer, chronic atrial fibrillation. She was seen by Dr. Wyn Quaker who did her right lower extremity angiogram , angioplasty of the right anterior tibial artery and thrombolysis with TPA of the right popliteal artery, and thrombectomy. She was seen by Dr. Wyn Quaker during this past week and he was pleased with the progress. He did say that if he took her to the operating room for any procedure he would debride the abdominal wound under anesthesia. She was also seen by Dr. Ether Griffins the podiatrist who thought that she may lose her right fourth toe at some stage may need an amputation of this. 10/21/2015 --patient known to Dr. Wyn Quaker and his last office visit from 10/04/2015 has been reviewed. 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 She also had a persistent ulceration of the right heel and markedly this area and a right third and fourth toe and a small scab on the calf but these are dry  and seemed  to be improving. Patient also has a left carotid endarterectomy and multiple interventions to a right brachiocephalic AV fistula. After the visit he had recommended noninvasive studies to recheck her revascularization. He was off the impression that she would likely lose the right fourth toe and the third toe was likely to heal. [name], Natalie Y. ([id number]) He was concerned about underlying muscle necrosis on her right heel and midfoot. 11/01/2015 -- an echo done in January of this year showed her left ventricular ejection fraction to be about 50-55%. The patient was seen by the PA and Dr. Driscilla Grammesdew's office and the plan was to take her to the operating room soon to have a debridement under anesthesia for the abdominal wall wound, the Achilles tendon on the right leg and amputation of the right fourth toe. The daughter and the patient do not feel that they would be able to undergo hyperbaric oxygen therapy 5 days a week for 6 weeks. 12/17/15; this is a medically complex woman who I note was recently in this clinic however I was not involved with her care. She returns today with multiple wounds; a) she has a wound in the mid abdomen that is been there since March of this year. I note that she is been to the overall for debridement recently. The exact etiology of this wound is not really clear b) left lower quadrant abdominal wound had some sanguinous drainage when she came in here. The patient fell in January and thinks this may have been secondary to a hematoma. c): The patient has 3 wounds on her right leg including a small wound on the right mid calf, a large area over the Achilles which currently has a wound VAC for the last 6 weeks, also a smaller wound on the distal part of the right heel. As far as I understand most of these wounds are currently been dressed with's calcium alginate. According to her daughter the Achilles wound under the wound VAC is doing well d) the patient is had  an amputation of her left fifth toe in January and the right fourth toe 6 weeks ago secondary to diabetic PAD e) the patient has chronic renal failure on dialysis for the last 2 years secondary to type 2 diabetes on insulin. The daughter's knowledge there is been no biopsy of the abdominal wounds given their current appearance and lack of undefined etiology at have to wonder about calciphylaxis. 12/18/15:Addendum; I have reviewed [facility] I can see no relevant x-rays of the right heel. I note her arteriogram and revascularization of her right lower extremity in April 2017. She had debridement of both abdominal wounds and the right heel and Achilles wound on 11/07/15 at which time she had a right fourth toe ray amputation. The abdominal wounds were debridement again on 6/29. I do not see any relevant pathology of these abdominal wounds 12/24/15; culture I did of the drainage from the midline abdominal wound last week showed both Proteus and ampicillin sensitive enterococcus. I've given her a course of Augmentin adjusted on dialysis days. She has no specific complaints today. Been using Santyl to the abdominal wounds in the right leg wound and the wound VAC on the right Achilles which was initially prescribed by Dr. dew 12/31/15; I have done two punch biopsies of the large midline abdominal. 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 May have been a trauma component of the one on the left lower quadrant however the midline wound had no such history. She has a large area on  the right Achilles heel with a wound VAC prescribed by Dr. dew. A small wound on the right anterior leg.Marland Kitchen. UNFORTUNATELY she has 2 new wounds today. One on the left heel which is probably a pressure area. As well her previous amputation site of her right fourth toe has dehisced and now has a small wound with significant depth at the amputation site. 01/14/2016 -- she returns after 2 weeks and had had a punch biopsy of abdominal wound done  the last visit -- had a biopsy of her midline abdominal wound done and the Pathology diagnosis is that of ulceration, necrosis and inflammation and negative for dysplasia and malignancy. 01/21/16. 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 In the meantime she has new wounds of the left heel the right fourth toe amputation site is opened up. The back is stopped to the right heel area. 01/28/16; the abdominal wounds continued to improve. The extensive wound on her right Achilles also looks stable except for the lower aspect of the wound where there is a large liquefied area that probes right down to her calcaneus. This cultured Proteus last week I have her on Augmentin and doxycycline 1. I think this is [name], Natalie Y. ([id number]) going to need a course of IV antibiotics and I will call dialysis. X-ray I did last week was negative, I think she is going to need an MRI 02/04/16; right heel MRI as before Saturday. Receiving I believe IV Rocephin at dialysis 02/11/16; as it turns out the patient could not have a MRI as she has a bladder stimulator in place even though it is not currently in use since the beginning of this year. Although she has an allergy to IV contrast she apparently has done well with premedication so we will have to go for a CT scan with contrast. In the meantime she has had a fall now has a large skin tear on her left upper arm. She went to the ER and they suggested Tegaderm over topical antibiotics 03/03/16 currently patient returns after having been hospitalized for 2 weeks and subsequently [facility]. She actually seems to be doing excellent compared to even when we last saw her according to our nursing staff. Both patient and her daughter are extremely encouraged at how well she is presenting at this point in time. Overall the biggest issue is still the right Achilles area which is  being managed at this point in time by Dr. Wyn Quakerew. Patient is currently utilizing a wound VAC to that region. 03/17/16; patient is at [facility]. Using Aquacel Ag to the wounds on her bilateral feet and still Prisma to the small open area on her abdomen. 03/31/16 at this point in time patient has been tolerating the dressing changes currently. She fortunately has no worsening of her symptoms although she tells me that the nurse who is caring [facility] decided that nothing was needed in regard to the lower abdominal wound from a dressing standpoint at this time. she is really not having significant discomfort or pain at this point she continues to have some tunneling in the proximal Achilles wound region. 04/07/16 patient continues to do well on evaluation today. Even the Achilles wound which has been the most tender is not giving her as much trouble. Unfortunately the PolyMem dressings that we utilize last week really did not seem to benefit her in particular.  Obviously we will discontinue that at this point in time today. 04-15-16:Ms. Sehgal is accompanied today by her daughter. She is still residing in an SNF undergoing dialysis, continues to receive antibiotics during dialysis as prescribed by Dr. Sampson Goon of infectious disease. She has a follow-up appointment with Dr. Sampson Goon on 04-17-16 to discuss the continuation of these antibiotics. Ms. Vanderlinden denies any pain to any of the 3 remaining wounds she does admit to changing to [employer] surgical shoes for safety while ambulating with physical therapy. She denies any falls since her last appointment here although she and her daughter do admit to increased tremors of unclear etiology since her last appointment. She has tolerated the dressing changes that were prescribed last week. 04-22-16 Ms. Hennigan presents today accompanied by her daughter for evaluation of her diabetic foot ulcers. She is still  residing in an SNF, she continues dialysis 3 times weekly. She'll follow up with Dr. Sampson Goon last Friday, and at that appointment IV antibiotics were discontinued. According to Ms. Rasor and her daughter if there is any regression of her wounds Dr. Sampson Goon will consider re-starting the antibiotics. Ms. Markin daughter states that since the discontinuation of antibiotic therapy her "twitches" have resolved. 04/29/16; I have not seen this patient in quite some time however she is completed triple antibiotic therapy given at dialysis for osteomyelitis as prescribed by infectious disease. She is still being followed by Dr. dew of vascular surgery. We are using Hydrofera Blue to the surface of these wounds. She currently has 2 open areas a substantial area over her Achilles area although this is a lot better than the last time I saw this. She also has a small wound superiorly from this wound which has a superior probing depth of 2 cm. 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 My biopsy did not prove this nevertheless I think this is the correct clinical diagnosis. We are now following her for an area on the right Achilles part of her heel. This is not any different from last week. [name], [name] ([id number]) She also has a small tunneling area just above this. And unfortunately this week there is been a reopening of an area where her right fourth toe was previously amputated. She is completed antibiotics 05/13/16; she has a new reopening on the mid abdominal wound in the same site is previously. This is a small open area. Apligraf #1 today to the areas on the right heel o2 still an open area in the fourth toe amputation site 05/27/16; the areas on her abdomen are  completely closed over. Small open area from last time is closed. Apligraf #2 today to the areas on the right heel. The 4th toe amputation site is also heel 06/10/16; we did not have an Apligraf to put on today. This appeared tunneling wound on the right posterior calf appears to be closed. The more substantial area on her Achilles itself is improved. 06/24/16; I reapplied her third Apligraf today although we have not had one debridement last time. The original tunneling wound is not as closed as last time. The more substantial area on her Achilles itself is improved with advancing epithelialization 07/08/16 Apligraf #4 today. The original long area over her Achilles as a healthy-looking area at the superior aspect and a small divot inferiorly.  Both of the wound surface is look healthy 07/22/16; patient arrived today with a 2 original wounds on the Achilles part of her ankle too small for another application of Apligraf. This is on the right Achilles area. The small divot inferiorly looks as though it may have skin over most of this and I can't really see an open area. She did have an erythematous area over the medial malleolus with some drainage this looks like a rapid injury not cellulitis 07/29/16; erythematous area over the medial malleolus seems a lot better this week. This was a wrap injury. The areas on the right Achilles is still a small divot. The area superiorly down somewhat in size. 08/05/16; condition is generally not feeling well. No major changed either wound. Using Prisma 08/19/16; patient was last here she was admitted to hospital last week. Apparently she was complaining mostly of abdominal pain however a CT scan of the abdomen and pelvis revealed changes in the L2-L3 interspace suspicious for discitis and osteomyelitis as well as changes in the adjacent paraspinal and psoas muscles. She was seen by infectious disease Dr. Sampson Goon. Her urine culture grew group B strep. An aspirate of  the fluid at L2-L3 I reviewed today. Her AFB Gram stain was negative, fungal culture is still in place. C + S is negative so far as of today. She is on vancomycin and ceftazidine dosed at dialysis for 6 weeks. She is not complaining of fever or back pain currently. We have been using [employer] and to her wounds 08/26/16; patient is still receiving IV antibiotics at dialysis. She does not feel well complaining of a lot of pain in the right hip radiating down her leg. She rubs the lateral aspect of the right hip over the greater trochanter. Her wound measurements were a lot of different this week although I don't think they were accurately recorded last week the wound on the right Achilles area looks about the same. Superficial without any depth. 09/09/16; patient is still receiving IV antibiotics at dialysis chide believe are vancomycin and ceftazidine. Since she was last here she is developed a new wound on the tip of her right great toe. The right Achilles wound had significant surface slough/necrotic material on arrival today Also of note the patient seems to be developing widespread myoclonic jerking which is gotten a lot worse over the last week to 2 although our intake nurse and the patient's daughter both state that she had had this at certain times in the past I had not specifically noticed this. Apparently at one point this was due to phosphorus abnormalities which were corrected at dialysis. I have reviewed her medication list I don't see any usual offenders except she is on Paxil Neurontin and Ultram although she takes the Ultram sparingly maybe once a week 09/15/16; still on vancomycin and ceftazidine as I understand things at dialysis. The area that was on the tip of her right great toe identified last week looks like an ischemic wound. She is known diabetic PAD and has had previous revascularization in 2017 by Dr. Wyn Quaker. It is possible she may need to see him again. I will probably  attempt debridement of this next week we are using topical Santyl. In the meantime her heel wound has closed superiorly she is only left with a small wound at the inferior part of the Achilles area which was her deeper part of this wound at one point however even this area looks better 09/23/16; the patient has been  to see Dr. Sampson Goon and I gather because of the myoclonic jerking he is [name], [name] ([id number]) changed one of her antibiotics probably the ceftazidine. Concerning today is the tip of her right great toe which seems to be an expanding necrotic area. She has known severe PAD and this looks like an ischemic wound to me. She has previously been revascularized by Dr.Dew in nearly part of 2017. We continue to make reasonable progression with the heel wound using Hydrofera Blue. We are using Prisma to the great toe on the right. 09/30/16; the patient looks a lot better today apparently her Rocephin was stopped also perhaps the vancomycin. She states she didn't get any antibiotics at dialysis. We have been using Hydrofera Blue to the heel area and Santyl to the right great toe. She has a follow-up with Dr. dew on 5/10with regards to the right toe wound which looks ischemic 10/07/16; the patient sees Dr. dew tomorrow however this is apparently about a dialysis access issue, I am hoping he will have a look at the right first toe. I'm thinking he will probably need to do another arterial study. The area on the right Achilles heel is for all intents and purposes resolved although there is still some foam herbal-looking areas under callus. We are using Hydrofera Blue to the Achilles and Santyl to the right first toe 10/14/16; repeat arteriogram booked for 5/31. right Achilles heel remains closed but in my mind vulnerable. tip of the right great toe unchanged 10/21/16; Angiography on 10/29/16. C/O severe low back pain related to previous discitis location 11/04/16; Patient had angiography. The  patient had a focal stenosis at the level of the knee in the popliteal artery of 75-80%. Apparently a large was small and did not contribute much flow distally. The posterior tibial artery also had flow into the foot but had 2 areas of stenosis one in the proximal segment then one in the midsegment both in the 70-80% range. The patient underwent a drug-coated angioplasty of the popliteal artery. This reduced the stenosis to 15-20%. She also had transluminal angioplasty of the right posterior tibial artery. In the meantime the patient has had progressive increase in pain in her low back. She has had previous discitis in this area and she saw Dr. Sampson Goon of infectious disease was taken her off all her antibiotics I think in preparation for repeat CT scan of her back possible fluid aspiration if the problem is recurrent/worsening discitis. She is now on oxycodone Electronic Signature(s) Signed: 11/04/2016 4:31:20 PM By: Baltazar Najjar MD Entered By: Baltazar Najjar on 11/04/2016 08:25:57 [name] ([id number]) -------------------------------------------------------------------------------- Physical Exam Details Patient Name: [name] Date of Service: 11/04/2016 8:00 AM Medical Record Patient Account Number: [id number] Number: Treating RN: Phillis Haggis 1933/06/23 (82 [name] Other Clinician: Date of Birth/Sex: Female) Treating Salah Burlison Primary Care Provider: [name] Provider/Extender: G Referring Provider: Charolotte Capuchin in Treatment: 57 Constitutional Sitting or standing Blood Pressure is within target range for patient.. Pulse regular and within target range for patient.Marland Kitchen Respirations regular, non-labored and within target range.. Patient did not feel warm. The patient is in too much pain to get up on the exam table. Looks somewhat listless probably related to narcotic use.. Eyes Conjunctivae clear. No discharge. Respiratory Respiratory  effort is easy and symmetric bilaterally. Rate is normal at rest and on room air.. Cardiovascular Femoral pulses palpable but not the popliteal. She has a right dorsalis pedis pulse that is palpable which is new  this week. Lymphatic not palpable in the popliteal or inguinal area on the right. Psychiatric listless likely secondary to narcotics. Notes Wound exam; oStill the same surface slough over the right great toe. Given the patient's current pain burden I elected not to debrided this today but will continue with Santyl based dressings oThe area on her posterior Achilles which are as her original problematic wound has an eschar in the lower end. I remove some of this last week but did not do this today. There is no evidence of surrounding infection here. Electronic Signature(s) Signed: 11/04/2016 4:31:20 PM By: Baltazar Najjar MD Entered By: Baltazar Najjar on 11/04/2016 08:29:08 [name] ([id number]) -------------------------------------------------------------------------------- Physician Orders Details Patient Name: [name], [name]. Date of Service: 11/04/2016 8:00 AM Medical Record Patient Account Number: [id number] Number: Treating RN: Phillis Haggis 1933/12/10 (82 [name] Other Clinician: Date of Birth/Sex: Female) Treating Chris Narasimhan Primary Care Provider: [name] Provider/Extender: G Referring Provider: Charolotte Capuchin in Treatment: 21 Verbal / Phone Orders: Yes Clinician: Ashok Cordia, Debi Read Back and Verified: Yes Diagnosis Coding Wound Cleansing Wound #20 Right Toe Great o Clean wound with Normal Saline. o Cleanse wound with mild soap and water o May shower with protection. Anesthetic Wound #20 Right Toe Great o Topical Lidocaine 4% cream applied to wound bed prior to debridement - all wounds for clinic use only Primary Wound Dressing Wound #20 Right Toe Great o Santyl Ointment Secondary Dressing Wound #20 Right Toe  Great o Dry Gauze o Kerlix and Coban o Foam Dressing Change Frequency Wound #20 Right Toe Great o Change dressing every other day. Follow-up Appointments Wound #20 Right Toe Great o Return Appointment in 2 weeks. Off-Loading Wound #20 Right Toe Great o Other: - Float heels MYKEL, MOHL ([id number]) Additional Orders / Instructions Wound #20 Right Toe Great o Activity as tolerated Electronic Signature(s) Signed: 11/04/2016 4:31:20 PM By: Baltazar Najjar MD Signed: 11/04/2016 5:14:09 PM By: Alejandro Mulling Entered By: Alejandro Mulling on 11/04/2016 08:18:29 [name] ([id number]) -------------------------------------------------------------------------------- Problem List Details Patient Name: [name], [name]. Date of Service: 11/04/2016 8:00 AM Medical Record Patient Account Number: [id number] Number: Treating RN: Phillis Haggis Sep 09, 1933 (82 [name] Other Clinician: Date of Birth/Sex: Female) Treating Pearlina Friedly Primary Care Provider: [name] Provider/Extender: G Referring Provider: Charolotte Capuchin in Treatment: 35 Active Problems ICD-10 Encounter Code Description Active Date Diagnosis E11.621 Type 2 diabetes mellitus with foot ulcer 12/17/2015 Yes L97.512 Non-pressure chronic ulcer of other part of right foot with 03/31/2016 Yes fat layer exposed E11.51 Type 2 diabetes mellitus with diabetic peripheral 12/17/2015 Yes angiopathy without gangrene Inactive Problems ICD-10 Code Description Active Date Inactive Date M86.671 Other chronic osteomyelitis, right ankle and foot 04/15/2016 04/15/2016 S31.104A Unspecified open wound of abdominal wall, left lower 12/17/2015 12/17/2015 quadrant without penetration into peritoneal cavity, initial encounter S31.103D Unspecified open wound of abdominal wall, right lower 04/15/2016 04/15/2016 quadrant without penetration into peritoneal cavity, subsequent encounter Resolved  Problems Electronic Signature(s) [name], [name] ([id number]) Signed: 11/04/2016 4:31:20 PM By: Baltazar Najjar MD Entered By: Baltazar Najjar on 11/04/2016 08:18:40 [name] ([id number]) -------------------------------------------------------------------------------- Progress Note Details Patient Name: [name] Date of Service: 11/04/2016 8:00 AM Medical Record Patient Account Number: [id number] Number: Treating RN: Phillis Haggis [birth year] (82 [name] Other Clinician: Date of Birth/Sex: Female) Treating Maxwell Martorano Primary Care Provider: [name] Provider/Extender: G Referring Provider: Charolotte Capuchin in Treatment: 22 Subjective Chief Complaint Information obtained from Patient Ms. Malachowski presents today for follow-up  evaluation of her diabetic foot ulcers and abdominal wound. History of Present Illness (HPI) The following HPI elements were documented for the patient's wound: Location: right posterior heel, right Achilles, right lower quadrant abdomen, right fourth toe amp site Quality: denies pain to any wound Severity: not applicable Timing: denies pain 83 year old patient who is known to be diabetic, was referred to [place] by Dr. Gavin Potters for a right heel ulceration which she's had for a while. She was recently in hospital for a pneumonia and at that time and got delirious and was disoriented and sometime during this time developed a stage II ulcer on her right heel. Her past medical history is significant for bilateral pneumonia which was treated with injectable antibiotics and then to oral Levaquin which he has completed. She also has acute on chronic diastolic CHF, acute on chronic respiratory failure, end-stage renal disease on hemodialysis, atrial fibrillation, recent stroke, diabetes mellitus. The patient and her son are poor historians but from what I understand she was admitted to the hospital with an acute vascular compromise of her  right lower extremity and Dr. Wyn Quaker has done a surgical procedure and we are trying to obtain these notes. There are also some vascular workup done and we will try and obtain these notes. the injury to the left lower quadrant of abdomen and the suprapubic area have been there due to a bruise and have been there for several months and no intervention has been done. 10/11/2015 -- on review of the electronics records it was noted that the patient was admitted to the hospital on 09/14/2015 with peripheral vascular disease with claudication, end-stage renal disease, pressure ulcer, chronic atrial fibrillation. She was seen by Dr. Wyn Quaker who did her right lower extremity angiogram , angioplasty of the right anterior tibial artery and thrombolysis with TPA of the right popliteal artery, and thrombectomy. She was seen by Dr. Wyn Quaker during this past week and he was pleased with the progress. He did say that if he took her to the operating room for any procedure he would debride the abdominal wound under anesthesia. She was also seen by Dr. Ether Griffins the podiatrist who thought that she may lose her right fourth toe at some stage may need an amputation of this. LATANJA, LEHENBAUER ([id number]) 10/21/2015 --patient known to Dr. Wyn Quaker and his last office visit from 10/04/2015 has been reviewed. 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 She also had a persistent ulceration of the right heel and markedly this area and a right third and fourth toe and a small scab on the calf but these are dry and seemed to be improving. Patient also has a left carotid endarterectomy and multiple interventions to a right brachiocephalic AV fistula. After the visit he had recommended noninvasive studies to recheck her revascularization. He was off the impression that she would likely lose the right fourth toe and the third  toe was likely to heal. He was concerned about underlying muscle necrosis on her right heel and midfoot. 11/01/2015 -- an echo done in January of this year showed her left ventricular ejection fraction to be about 50-55%. The patient was seen by the PA and Dr. Driscilla Grammes office and the plan was to take her to the operating room soon to have a debridement under anesthesia for the abdominal wall wound, the Achilles tendon on the right leg and amputation of the right fourth toe. The  daughter and the patient do not feel that they would be able to undergo hyperbaric oxygen therapy 5 days a week for 6 weeks. 12/17/15; this is a medically complex woman who I note was recently in this clinic however I was not involved with her care. She returns today with multiple wounds; a) she has a wound in the mid abdomen that is been there since March of this year. I note that she is been to the overall for debridement recently. The exact etiology of this wound is not really clear b) left lower quadrant abdominal wound had some sanguinous drainage when she came in here. The patient fell in January and thinks this may have been secondary to a hematoma. c): The patient has 3 wounds on her right leg including a small wound on the right mid calf, a large area over the Achilles which currently has a wound VAC for the last 6 weeks, also a smaller wound on the distal part of the right heel. As far as I understand most of these wounds are currently been dressed with's calcium alginate. According to her daughter the Achilles wound under the wound VAC is doing well d) the patient is had an amputation of her left fifth toe in January and the right fourth toe 6 weeks ago secondary to diabetic PAD e) the patient has chronic renal failure on dialysis for the last 2 years secondary to type 2 diabetes on insulin. The daughter's knowledge there is been no biopsy of the abdominal wounds given their current appearance and lack of  undefined etiology at have to wonder about calciphylaxis. 12/18/15:Addendum; I have reviewed [facility] I can see no relevant x-rays of the right heel. I note her arteriogram and revascularization of her right lower extremity in April 2017. She had debridement of both abdominal wounds and the right heel and Achilles wound on 11/07/15 at which time she had a right fourth toe ray amputation. The abdominal wounds were debridement again on 6/29. I do not see any relevant pathology of these abdominal wounds 12/24/15; culture I did of the drainage from the midline abdominal wound last week showed both Proteus and ampicillin sensitive enterococcus. I've given her a course of Augmentin adjusted on dialysis days. She has no specific complaints today. Been using Santyl to the abdominal wounds in the right leg wound and the wound VAC on the right Achilles which was initially prescribed by Dr. dew 12/31/15; I have done two punch biopsies of the large midline abdominal. 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 May have been a trauma component of the one on the left lower quadrant however the midline wound had no such history. She has a large area on the right Achilles heel with a wound VAC prescribed by Dr. dew. A small wound on the right anterior leg.Marland Kitchen UNFORTUNATELY she has 2 new wounds today. One on the left heel which is probably a pressure area. As well her previous amputation site of her right fourth toe has dehisced and now has a small wound with significant depth at the amputation site. 01/14/2016 -- she returns after 2 weeks and had had a punch biopsy of abdominal wound done the last visit [name], [name] ([id number]) -- had a biopsy of her midline abdominal wound done and the Pathology diagnosis is that of ulceration, necrosis and inflammation and negative for dysplasia and malignancy. 01/21/16. 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 In the meantime she  has new wounds of the left heel the right fourth toe amputation site is opened up. The back is stopped to the right heel area. 01/28/16; the abdominal wounds continued to improve. The extensive wound on her right Achilles also looks stable except for the lower aspect of the wound where there is a large liquefied area that probes right down to her calcaneus. This cultured Proteus last week I have her on Augmentin and doxycycline 1. I think this is going to need a course of IV antibiotics and I will call dialysis. X-ray I did last week was negative, I think she is going to need an MRI 02/04/16; right heel MRI as before Saturday. Receiving I believe IV Rocephin at dialysis 02/11/16; as it turns out the patient could not have a MRI as she has a bladder stimulator in place even though it is not currently in use since the beginning of this year. Although she has an allergy to IV contrast she apparently has done well with premedication so we will have to go for a CT scan with contrast. In the meantime she has had a fall now has a large skin tear on her left upper arm. She went to the ER and they suggested Tegaderm over topical antibiotics 03/03/16 currently patient returns after having been hospitalized for 2 weeks and subsequently transferred to [facility] nursing facility. She actually seems to be doing excellent compared to even when we last saw her according to our nursing staff. Both patient and her daughter are extremely encouraged at how well she is presenting at this point in time. Overall the biggest issue is still the right Achilles area which is being managed at this point in time by Dr. Wyn Quaker. Patient is currently utilizing a wound VAC to that region. 03/17/16; patient is at [facility] nursing home still. Using Aquacel Ag to the wounds on her bilateral feet and still Prisma to the small open area on her abdomen. 03/31/16 at this point in time patient has been  tolerating the dressing changes currently. She fortunately has no worsening of her symptoms although she tells me that the nurse who is caring for her at [facility] nursing facility decided that nothing was needed in regard to the lower abdominal wound from a dressing standpoint at this time. she is really not having significant discomfort or pain at this point she continues to have some tunneling in the proximal Achilles wound region. 04/07/16 patient continues to do well on evaluation today. Even the Achilles wound which has been the most tender is not giving her as much trouble. Unfortunately the PolyMem dressings that we utilize last week really did not seem to benefit her in particular. Obviously we will discontinue that at this point in time today. 04-15-16:Ms. Wise is accompanied today by her daughter. She is still residing in an SNF undergoing dialysis, continues to receive antibiotics during dialysis as prescribed by Dr. Sampson Goon of infectious disease. She has a follow-up appointment with Dr. Sampson Goon on 04-17-16 to discuss the continuation of these antibiotics. Ms. Kazee denies any pain to any of the 3 remaining wounds she does admit to changing to [employer] surgical shoes for safety while ambulating with physical therapy. She denies any falls since her last appointment here although she and her daughter do admit to increased tremors of unclear etiology since her last appointment. She has tolerated the dressing changes that were prescribed last week. 04-22-16 Ms. Besaw presents today accompanied by her daughter for evaluation of her diabetic foot  ulcers. She is still residing in an SNF, she continues dialysis 3 times weekly. She'll follow up with Dr. Sampson Goon last Friday, and at that appointment IV antibiotics were discontinued. According to Ms. Schalk and her daughter if there is any regression of her wounds Dr. Sampson Goon will consider re-starting the antibiotics. Ms. Patalano  daughter states that since the discontinuation of antibiotic therapy her "twitches" have resolved. 04/29/16; I have not seen this patient in quite some time however she is completed triple antibiotic therapy given at dialysis for osteomyelitis as prescribed by infectious disease. She is still being followed by Dr. Wyn Quaker [name] ([id number]) of vascular surgery. We are using Hydrofera Blue to the surface of these wounds. She currently has 2 open areas a substantial area over her Achilles area although this is a lot better than the last time I saw this. She also has a small wound superiorly from this wound which has a superior probing depth of 2 cm. 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 My biopsy did not prove this nevertheless I think this is the correct clinical diagnosis. We are now following her for an area on the right Achilles part of her heel. This is not any different from last week. She also has a small tunneling area just above this. And unfortunately this week there is been a reopening of an area where her right fourth toe was previously amputated. She is completed antibiotics 05/13/16; she has a new reopening on the mid abdominal wound in the same site is previously. This is a small open area. Apligraf #1 today to the areas on the right heel o2 still an open area in the fourth toe amputation site 05/27/16; the areas on her abdomen are completely closed over. Small open area from last time is closed. Apligraf #2 today to the areas on the right heel. The 4th toe amputation site is also heel 06/10/16; we did not have an Apligraf to put on today. This appeared tunneling wound on the right posterior calf appears to be closed. The more substantial area on  her Achilles itself is improved. 06/24/16; I reapplied her third Apligraf today although we have not had one debridement last time. The original tunneling wound is not as closed as last time. The more substantial area on her Achilles itself is improved with advancing epithelialization 07/08/16 Apligraf #4 today. The original long area over her Achilles as a healthy-looking area at the superior aspect and a small divot inferiorly. Both of the wound surface is look healthy 07/22/16; patient arrived today with a 2 original wounds on the Achilles part of her ankle too small for another application of Apligraf. This is on the right Achilles area. The small divot inferiorly looks as though it may have skin over most of this and I can't really see an open area. She did have an erythematous area over the medial malleolus with some drainage this looks like a rapid injury not cellulitis 07/29/16; erythematous area over the medial malleolus seems a lot better this week. This was a wrap injury. The areas on the right Achilles is still a small divot. The area superiorly down somewhat in size. 08/05/16; condition is generally not feeling well. No major changed either wound. Using Prisma 08/19/16; patient was last here she was  admitted to hospital last week. Apparently she was complaining mostly of abdominal pain however a CT scan of the abdomen and pelvis revealed changes in the L2-L3 interspace suspicious for discitis and osteomyelitis as well as changes in the adjacent paraspinal and psoas muscles. She was seen by infectious disease Dr. Sampson Goon. Her urine culture grew group B strep. An aspirate of the fluid at L2-L3 I reviewed today. Her AFB Gram stain was negative, fungal culture is still in place. C + S is negative so far as of today. She is on vancomycin and ceftazidine dosed at dialysis for 6 weeks. She is not complaining of fever or back pain currently. We have been using [employer] and to her  wounds 08/26/16; patient is still receiving IV antibiotics at dialysis. She does not feel well complaining of a lot of pain in the right hip radiating down her leg. She rubs the lateral aspect of the right hip over the greater trochanter. Her wound measurements were a lot of different this week although I don't think they were accurately recorded last week the wound on the right Achilles area looks about the same. Superficial without any depth. 09/09/16; patient is still receiving IV antibiotics at dialysis chide believe are vancomycin and ceftazidine. Since she was last here she is developed a new wound on the tip of her right great toe. The right Achilles wound had significant surface slough/necrotic material on arrival today Also of note the patient seems to be developing widespread myoclonic jerking which is gotten a lot worse over the last week to 2 although our intake nurse and the patient's daughter both state that she had had this at certain times in the past I had not specifically noticed this. Apparently at one point this was due to phosphorus abnormalities which were corrected at dialysis. I have reviewed her medication list I don't see any usual offenders except she is on Paxil Neurontin and Ultram although she takes the Ultram sparingly [name], [name] ([id number]) maybe once a week 09/15/16; still on vancomycin and ceftazidine as I understand things at dialysis. The area that was on the tip of her right great toe identified last week looks like an ischemic wound. She is known diabetic PAD and has had previous revascularization in 2017 by Dr. Wyn Quaker. It is possible she may need to see him again. I will probably attempt debridement of this next week we are using topical Santyl. In the meantime her heel wound has closed superiorly she is only left with a small wound at the inferior part of the Achilles area which was her deeper part of this wound at one point however even this area looks  better 09/23/16; the patient has been to see Dr. Sampson Goon and I gather because of the myoclonic jerking he is changed one of her antibiotics probably the ceftazidine. Concerning today is the tip of her right great toe which seems to be an expanding necrotic area. She has known severe PAD and this looks like an ischemic wound to me. She has previously been revascularized by Dr.Dew in nearly part of 2017. We continue to make reasonable progression with the heel wound using Hydrofera Blue. We are using Prisma to the great toe on the right. 09/30/16; the patient looks a lot better today apparently her Rocephin was stopped also perhaps the vancomycin. She states she didn't get any antibiotics at dialysis. We have been using Hydrofera Blue to the heel area and Santyl to the right great toe. She has  a follow-up with Dr. dew on 5/10with regards to the right toe wound which looks ischemic 10/07/16; the patient sees Dr. dew tomorrow however this is apparently about a dialysis access issue, I am hoping he will have a look at the right first toe. I'm thinking he will probably need to do another arterial study. The area on the right Achilles heel is for all intents and purposes resolved although there is still some foam herbal-looking areas under callus. We are using Hydrofera Blue to the Achilles and Santyl to the right first toe 10/14/16; repeat arteriogram booked for 5/31. right Achilles heel remains closed but in my mind vulnerable. tip of the right great toe unchanged 10/21/16; Angiography on 10/29/16. C/O severe low back pain related to previous discitis location 11/04/16; Patient had angiography. The patient had a focal stenosis at the level of the knee in the popliteal artery of 75-80%. Apparently a large was small and did not contribute much flow distally. The posterior tibial artery also had flow into the foot but had 2 areas of stenosis one in the proximal segment then one in the midsegment both in the  70-80% range. The patient underwent a drug-coated angioplasty of the popliteal artery. This reduced the stenosis to 15-20%. She also had transluminal angioplasty of the right posterior tibial artery. In the meantime the patient has had progressive increase in pain in her low back. She has had previous discitis in this area and she saw Dr. Sampson Goon of infectious disease was taken her off all her antibiotics I think in preparation for repeat CT scan of her back possible fluid aspiration if the problem is recurrent/worsening discitis. She is now on oxycodone Objective Constitutional Sitting or standing Blood Pressure is within target range for patient.. Pulse regular and within target range for patient.Marland Kitchen Respirations regular, non-labored and within target range.. Patient did not feel warm. The patient is in too much pain to get up on the exam table. Looks somewhat listless probably related to narcotic use.Marland Kitchen DYNESHA, WOOLEN ([id number]) Vitals Time Taken: 8:07 AM, Height: 65 in, Weight: 160 lbs, BMI: 26.6, Pulse: 70 bpm, Respiratory Rate: 17 breaths/min, Blood Pressure: 112/53 mmHg. Eyes Conjunctivae clear. No discharge. Respiratory Respiratory effort is easy and symmetric bilaterally. Rate is normal at rest and on room air.. Cardiovascular Femoral pulses palpable but not the popliteal. She has a right dorsalis pedis pulse that is palpable which is new this week. Lymphatic not palpable in the popliteal or inguinal area on the right. Psychiatric listless likely secondary to narcotics. General Notes: Wound exam; Still the same surface slough over the right great toe. Given the patient's current pain burden I elected not to debrided this today but will continue with Santyl based dressings The area on her posterior Achilles which are as her original problematic wound has an eschar in the lower end. I remove some of this last week but did not do this today. There is no evidence of surrounding  infection here. Integumentary (Hair, Skin) Wound #20 status is Open. Original cause of wound was Gradually Appeared. The wound is located on the Right Toe Great. The wound measures 1.4cm length x 1.3cm width x 0.1cm depth; 1.429cm^2 area and 0.143cm^3 volume. There is Fat Layer (Subcutaneous Tissue) Exposed exposed. There is no tunneling or undermining noted. There is a large amount of serous drainage noted. The wound margin is distinct with the outline attached to the wound base. There is no granulation within the wound bed. There is a large (67-  100%) amount of necrotic tissue within the wound bed including Eschar and Adherent Slough. The periwound skin appearance did not exhibit: Callus, Crepitus, Excoriation, Induration, Rash, Scarring, Dry/Scaly, Maceration, Atrophie Blanche, Cyanosis, Ecchymosis, Hemosiderin Staining, Mottled, Pallor, Rubor, Erythema. Periwound temperature was noted as No Abnormality. The periwound has tenderness on palpation. Assessment Active Problems ICD-10 E11.621 - Type 2 diabetes mellitus with foot ulcer L97.512 - Non-pressure chronic ulcer of other part of right foot with fat layer exposed E11.51 - Type 2 diabetes mellitus with diabetic peripheral angiopathy without gangrene [name], DANDRIDGE ([id number]) Plan Wound Cleansing: Wound #20 Right Toe Great: Clean wound with Normal Saline. Cleanse wound with mild soap and water May shower with protection. Anesthetic: Wound #20 Right Toe Great: Topical Lidocaine 4% cream applied to wound bed prior to debridement - all wounds for clinic use only Primary Wound Dressing: Wound #20 Right Toe Great: Santyl Ointment Secondary Dressing: Wound #20 Right Toe Great: Dry Gauze Kerlix and Coban Foam Dressing Change Frequency: Wound #20 Right Toe Great: Change dressing every other day. Follow-up Appointments: Wound #20 Right Toe Great: Return Appointment in 2 weeks. Off-Loading: Wound #20 Right Toe Great: Other:  - Float heels Additional Orders / Instructions: Wound #20 Right Toe Great: Activity as tolerated #1 continue Santyl-based dressings to the right great toe tip. At some point this will need debridement but I elected not to do this today the patient looked generally too uncomfortable from a low back point of view. #2 the patient will need mechanical debridement of the toe and I'll probably do this in 2 weeks, hopefully the patient will be in a better state from her low back pain and will have her CT scan I believe next week [name], [name] ([id number]) Electronic Signature(s) Signed: 11/04/2016 4:31:20 PM By: Baltazar Najjar MD Entered By: Baltazar Najjar on 11/04/2016 08:30:09 [name] ([id number]) -------------------------------------------------------------------------------- SuperBill Details Patient Name: [name] Date of Service: 11/04/2016 Medical Record Patient Account Number: [id number] Number: Treating RN: Phillis Haggis Oct 31, 1933 (82 [name] Other Clinician: Date of Birth/Sex: Female) Treating Michille Mcelrath Primary Care Provider: [name] Provider/Extender: G Referring Provider: Charolotte Capuchin in Treatment: 46 Diagnosis Coding ICD-10 Codes Code Description E11.621 Type 2 diabetes mellitus with foot ulcer L97.512 Non-pressure chronic ulcer of other part of right foot with fat layer exposed E11.51 Type 2 diabetes mellitus with diabetic peripheral angiopathy without gangrene Facility Procedures CPT4 Code: [id number] Description: 99213 - WOUND CARE VISIT-LEV 3 EST PT Modifier: Quantity: 1 Physician Procedures CPT4 Code Description: 2841324 99213 - WC PHYS LEVEL 3 - EST PT ICD-10 Description Diagnosis E11.621 Type 2 diabetes mellitus with foot ulcer L97.512 Non-pressure chronic ulcer of other part of right fo Modifier: ot with fat lay Quantity: 1 er exposed Electronic Signature(s) Signed: 11/04/2016 4:31:20 PM By: Baltazar Najjar  MD Signed: 11/04/2016 5:14:09 PM By: Alejandro Mulling Entered By: Alejandro Mulling on 11/04/2016 10:30:52

## 2016-11-11 ENCOUNTER — Encounter: Payer: Self-pay | Admitting: Emergency Medicine

## 2016-11-11 ENCOUNTER — Inpatient Hospital Stay
Admission: EM | Admit: 2016-11-11 | Discharge: 2016-11-19 | DRG: 551 | Disposition: A | Discharge status: Skilled Nursing Facility | Payer: Medicare Other | Attending: Internal Medicine | Admitting: Internal Medicine

## 2016-11-11 DIAGNOSIS — Z992 Dependence on renal dialysis: Secondary | ICD-10-CM

## 2016-11-11 DIAGNOSIS — M545 Low back pain, unspecified: Secondary | ICD-10-CM

## 2016-11-11 DIAGNOSIS — N19 Unspecified kidney failure: Secondary | ICD-10-CM | POA: Diagnosis present

## 2016-11-11 DIAGNOSIS — I5030 Unspecified diastolic (congestive) heart failure: Secondary | ICD-10-CM | POA: Diagnosis present

## 2016-11-11 DIAGNOSIS — I132 Hypertensive heart and chronic kidney disease with heart failure and with stage 5 chronic kidney disease, or end stage renal disease: Secondary | ICD-10-CM | POA: Diagnosis present

## 2016-11-11 DIAGNOSIS — Z66 Do not resuscitate: Secondary | ICD-10-CM | POA: Diagnosis present

## 2016-11-11 DIAGNOSIS — I482 Chronic atrial fibrillation: Secondary | ICD-10-CM | POA: Diagnosis present

## 2016-11-11 DIAGNOSIS — K219 Gastro-esophageal reflux disease without esophagitis: Secondary | ICD-10-CM | POA: Diagnosis present

## 2016-11-11 DIAGNOSIS — E785 Hyperlipidemia, unspecified: Secondary | ICD-10-CM | POA: Diagnosis present

## 2016-11-11 DIAGNOSIS — G2581 Restless legs syndrome: Secondary | ICD-10-CM | POA: Diagnosis present

## 2016-11-11 DIAGNOSIS — I4892 Unspecified atrial flutter: Secondary | ICD-10-CM | POA: Diagnosis present

## 2016-11-11 DIAGNOSIS — D631 Anemia in chronic kidney disease: Secondary | ICD-10-CM | POA: Diagnosis present

## 2016-11-11 DIAGNOSIS — Z7189 Other specified counseling: Secondary | ICD-10-CM

## 2016-11-11 DIAGNOSIS — N186 End stage renal disease: Secondary | ICD-10-CM | POA: Diagnosis present

## 2016-11-11 DIAGNOSIS — N2581 Secondary hyperparathyroidism of renal origin: Secondary | ICD-10-CM | POA: Diagnosis present

## 2016-11-11 DIAGNOSIS — Z79899 Other long term (current) drug therapy: Secondary | ICD-10-CM | POA: Diagnosis not present

## 2016-11-11 DIAGNOSIS — M4646 Discitis, unspecified, lumbar region: Secondary | ICD-10-CM | POA: Diagnosis present

## 2016-11-11 DIAGNOSIS — Z8673 Personal history of transient ischemic attack (TIA), and cerebral infarction without residual deficits: Secondary | ICD-10-CM | POA: Diagnosis not present

## 2016-11-11 DIAGNOSIS — I272 Pulmonary hypertension, unspecified: Secondary | ICD-10-CM | POA: Diagnosis present

## 2016-11-11 DIAGNOSIS — Z515 Encounter for palliative care: Secondary | ICD-10-CM | POA: Diagnosis present

## 2016-11-11 DIAGNOSIS — R531 Weakness: Secondary | ICD-10-CM

## 2016-11-11 DIAGNOSIS — M4640 Discitis, unspecified, site unspecified: Secondary | ICD-10-CM | POA: Diagnosis present

## 2016-11-11 DIAGNOSIS — E1140 Type 2 diabetes mellitus with diabetic neuropathy, unspecified: Secondary | ICD-10-CM | POA: Diagnosis present

## 2016-11-11 DIAGNOSIS — E1122 Type 2 diabetes mellitus with diabetic chronic kidney disease: Secondary | ICD-10-CM | POA: Diagnosis present

## 2016-11-11 DIAGNOSIS — M5442 Lumbago with sciatica, left side: Secondary | ICD-10-CM | POA: Diagnosis not present

## 2016-11-11 DIAGNOSIS — M4626 Osteomyelitis of vertebra, lumbar region: Secondary | ICD-10-CM | POA: Diagnosis present

## 2016-11-11 DIAGNOSIS — M869 Osteomyelitis, unspecified: Secondary | ICD-10-CM | POA: Diagnosis not present

## 2016-11-11 DIAGNOSIS — Z7901 Long term (current) use of anticoagulants: Secondary | ICD-10-CM

## 2016-11-11 LAB — COMPREHENSIVE METABOLIC PANEL
ALT: 8 U/L — AB (ref 14–54)
AST: 23 U/L (ref 15–41)
Albumin: 2.9 g/dL — ABNORMAL LOW (ref 3.5–5.0)
Alkaline Phosphatase: 58 U/L (ref 38–126)
Anion gap: 20 — ABNORMAL HIGH (ref 5–15)
BUN: 87 mg/dL — ABNORMAL HIGH (ref 6–20)
CHLORIDE: 93 mmol/L — AB (ref 101–111)
CO2: 20 mmol/L — ABNORMAL LOW (ref 22–32)
CREATININE: 8.6 mg/dL — AB (ref 0.44–1.00)
Calcium: 7.8 mg/dL — ABNORMAL LOW (ref 8.9–10.3)
GFR calc non Af Amer: 4 mL/min — ABNORMAL LOW (ref >60)
GFR, EST AFRICAN AMERICAN: 4 mL/min — AB (ref >60)
Glucose, Bld: 73 mg/dL (ref 65–99)
Potassium: 4.5 mmol/L (ref 3.5–5.1)
Sodium: 133 mmol/L — ABNORMAL LOW (ref 135–145)
Total Bilirubin: 0.8 mg/dL (ref 0.3–1.2)
Total Protein: 6.8 g/dL (ref 6.5–8.1)

## 2016-11-11 LAB — CBC
HCT: 30.3 % — ABNORMAL LOW (ref 35.0–47.0)
Hemoglobin: 10 g/dL — ABNORMAL LOW (ref 12.0–16.0)
MCH: 30.7 pg (ref 26.0–34.0)
MCHC: 33 g/dL (ref 32.0–36.0)
MCV: 92.9 fL (ref 80.0–100.0)
PLATELETS: 222 10*3/uL (ref 150–440)
RBC: 3.26 MIL/uL — AB (ref 3.80–5.20)
RDW: 17.2 % — ABNORMAL HIGH (ref 11.5–14.5)
WBC: 8.4 10*3/uL (ref 3.6–11.0)

## 2016-11-11 LAB — PROTIME-INR
INR: 2.46
Prothrombin Time: 27.1 seconds — ABNORMAL HIGH (ref 11.4–15.2)

## 2016-11-11 LAB — GLUCOSE, CAPILLARY
Glucose-Capillary: 91 mg/dL (ref 65–99)
Glucose-Capillary: 83 mg/dL (ref 65–99)
Glucose-Capillary: 97 mg/dL (ref 65–99)

## 2016-11-11 LAB — C-REACTIVE PROTEIN: CRP: 7.1 mg/dL — ABNORMAL HIGH (ref <1.0)

## 2016-11-11 LAB — PHOSPHORUS: Phosphorus: 8.1 mg/dL — ABNORMAL HIGH (ref 2.5–4.6)

## 2016-11-11 MED ORDER — BIOTIN 1 MG PO CAPS: 1.0000 mg | ORAL_CAPSULE | Freq: Every day | ORAL | Status: DC

## 2016-11-11 MED ORDER — VANCOMYCIN HCL IN DEXTROSE 750-5 MG/150ML-% IV SOLN
750.0000 mg | INTRAVENOUS | Status: DC
  Administered 2016-11-11 – 2016-11-18 (×4): 750 mg via INTRAVENOUS
  Filled 2016-11-11 (×8): qty 150

## 2016-11-11 MED ORDER — RENA-VITE PO TABS
1.0000 | ORAL_TABLET | Freq: Every day | ORAL | Status: DC
  Administered 2016-11-13 – 2016-11-19 (×7): 1 via ORAL
  Filled 2016-11-11 (×7): qty 1

## 2016-11-11 MED ORDER — DILTIAZEM HCL ER COATED BEADS 120 MG PO CP24
120.0000 mg | ORAL_CAPSULE | Freq: Every day | ORAL | Status: DC
  Administered 2016-11-11 – 2016-11-18 (×8): 120 mg via ORAL
  Filled 2016-11-11 (×8): qty 1

## 2016-11-11 MED ORDER — VANCOMYCIN HCL 500 MG IV SOLR
500.0000 mg | Freq: Once | INTRAVENOUS | Status: AC
  Administered 2016-11-11: 500 mg via INTRAVENOUS
  Filled 2016-11-11: qty 500

## 2016-11-11 MED ORDER — INSULIN ASPART 100 UNIT/ML ~~LOC~~ SOLN
0.0000 [IU] | Freq: Three times a day (TID) | SUBCUTANEOUS | Status: DC
  Administered 2016-11-14: 1 [IU] via SUBCUTANEOUS
  Administered 2016-11-15: 2 [IU] via SUBCUTANEOUS
  Administered 2016-11-15 – 2016-11-16 (×2): 1 [IU] via SUBCUTANEOUS
  Administered 2016-11-17: 2 [IU] via SUBCUTANEOUS
  Administered 2016-11-18 – 2016-11-19 (×3): 1 [IU] via SUBCUTANEOUS
  Filled 2016-11-11 (×9): qty 1

## 2016-11-11 MED ORDER — POLYETHYLENE GLYCOL 3350 17 G PO PACK: 17.0000 g | PACK | Freq: Every day | ORAL | Status: DC | PRN

## 2016-11-11 MED ORDER — TRAMADOL HCL 50 MG PO TABS
50.0000 mg | ORAL_TABLET | Freq: Four times a day (QID) | ORAL | Status: DC | PRN
  Administered 2016-11-12 – 2016-11-19 (×6): 50 mg via ORAL
  Filled 2016-11-11 (×6): qty 1

## 2016-11-11 MED ORDER — ALBUTEROL SULFATE (2.5 MG/3ML) 0.083% IN NEBU: 2.5000 mg | INHALATION_SOLUTION | Freq: Four times a day (QID) | RESPIRATORY_TRACT | Status: DC | PRN

## 2016-11-11 MED ORDER — PRAVASTATIN SODIUM 20 MG PO TABS
20.0000 mg | ORAL_TABLET | Freq: Every evening | ORAL | Status: DC
  Administered 2016-11-11 – 2016-11-18 (×7): 20 mg via ORAL
  Filled 2016-11-11 (×10): qty 1

## 2016-11-11 MED ORDER — ONDANSETRON HCL 4 MG/2ML IJ SOLN: 4.0000 mg | Freq: Four times a day (QID) | INTRAMUSCULAR | Status: DC | PRN

## 2016-11-11 MED ORDER — OMEGA-3-ACID ETHYL ESTERS 1 G PO CAPS
1000.0000 mg | ORAL_CAPSULE | Freq: Two times a day (BID) | ORAL | Status: DC
  Administered 2016-11-11 – 2016-11-19 (×15): 1000 mg via ORAL
  Filled 2016-11-11 (×15): qty 1

## 2016-11-11 MED ORDER — FENTANYL 25 MCG/HR TD PT72
25.0000 ug | MEDICATED_PATCH | TRANSDERMAL | Status: DC
  Administered 2016-11-11 – 2016-11-17 (×3): 25 ug via TRANSDERMAL
  Filled 2016-11-11 (×3): qty 1

## 2016-11-11 MED ORDER — CALCIUM ACETATE (PHOS BINDER) 667 MG PO CAPS
1334.0000 mg | ORAL_CAPSULE | Freq: Three times a day (TID) | ORAL | Status: DC
  Administered 2016-11-12 – 2016-11-19 (×16): 1334 mg via ORAL
  Filled 2016-11-11 (×18): qty 2

## 2016-11-11 MED ORDER — HEPARIN SODIUM (PORCINE) 5000 UNIT/ML IJ SOLN: 5000.0000 [IU] | Freq: Three times a day (TID) | INTRAMUSCULAR | Status: DC

## 2016-11-11 MED ORDER — ACETAMINOPHEN 650 MG RE SUPP: 650.0000 mg | Freq: Four times a day (QID) | RECTAL | Status: DC | PRN

## 2016-11-11 MED ORDER — ACETAMINOPHEN 325 MG PO TABS
650.0000 mg | ORAL_TABLET | Freq: Four times a day (QID) | ORAL | Status: DC | PRN
  Administered 2016-11-17: 650 mg via ORAL
  Filled 2016-11-11: qty 2

## 2016-11-11 MED ORDER — EPOETIN ALFA 10000 UNIT/ML IJ SOLN
10000.0000 [IU] | INTRAMUSCULAR | Status: DC
  Administered 2016-11-11 – 2016-11-18 (×4): 10000 [IU] via INTRAVENOUS
  Filled 2016-11-11 (×2): qty 1

## 2016-11-11 MED ORDER — ALBUTEROL SULFATE HFA 108 (90 BASE) MCG/ACT IN AERS: 1.0000 | INHALATION_SPRAY | Freq: Four times a day (QID) | RESPIRATORY_TRACT | Status: DC | PRN

## 2016-11-11 MED ORDER — WARFARIN SODIUM 5 MG PO TABS
2.5000 mg | ORAL_TABLET | Freq: Every day | ORAL | Status: DC
  Administered 2016-11-11 – 2016-11-15 (×5): 2.5 mg via ORAL
  Filled 2016-11-11 (×5): qty 0.5

## 2016-11-11 MED ORDER — HYDROMORPHONE HCL 1 MG/ML IJ SOLN
1.0000 mg | Freq: Once | INTRAMUSCULAR | Status: AC
  Administered 2016-11-11: 1 mg via INTRAVENOUS
  Filled 2016-11-11: qty 1

## 2016-11-11 MED ORDER — WARFARIN - PHARMACIST DOSING INPATIENT
Freq: Every day | Status: DC
  Administered 2016-11-13 – 2016-11-19 (×6)

## 2016-11-11 MED ORDER — ONDANSETRON HCL 4 MG PO TABS: 4.0000 mg | ORAL_TABLET | Freq: Four times a day (QID) | ORAL | Status: DC | PRN

## 2016-11-11 MED ORDER — ONDANSETRON HCL 4 MG/2ML IJ SOLN
4.0000 mg | Freq: Once | INTRAMUSCULAR | Status: AC
  Administered 2016-11-11: 4 mg via INTRAVENOUS
  Filled 2016-11-11: qty 2

## 2016-11-11 MED ORDER — PAROXETINE HCL 20 MG PO TABS
40.0000 mg | ORAL_TABLET | Freq: Every day | ORAL | Status: DC
  Administered 2016-11-11 – 2016-11-18 (×8): 40 mg via ORAL
  Filled 2016-11-11 (×9): qty 2

## 2016-11-11 MED ORDER — VANCOMYCIN HCL IN DEXTROSE 1-5 GM/200ML-% IV SOLN
1000.0000 mg | Freq: Once | INTRAVENOUS | Status: AC
  Administered 2016-11-11: 1000 mg via INTRAVENOUS
  Filled 2016-11-11: qty 200

## 2016-11-11 MED ORDER — FAMOTIDINE 10 MG PO TABS
10.0000 mg | ORAL_TABLET | Freq: Every day | ORAL | Status: DC
  Administered 2016-11-11 – 2016-11-19 (×8): 10 mg via ORAL
  Filled 2016-11-11 (×6): qty 0.5
  Filled 2016-11-11: qty 1
  Filled 2016-11-11: qty 0.5

## 2016-11-11 MED ORDER — DOCUSATE SODIUM 100 MG PO CAPS
200.0000 mg | ORAL_CAPSULE | Freq: Two times a day (BID) | ORAL | Status: DC | PRN
  Filled 2016-11-11: qty 2

## 2016-11-11 NOTE — Progress Notes (Signed)
Pharmacy Antibiotic Note  [name] is a 83 y.o. female admitted on 11/11/2016 with Discitis.  Pharmacy has been consulted for vancomcyin dosing. Patient has ESRD and receives HD M,W,F. Patient received vancomycin 1g IV in ED x 1 dose.   Plan: Will order an additional vancomycin 500mg  , for total of vancomycin 1500mg  loading dose.. Vancomycin 750mg  IV ordered to be given after HD on Mon, Wed, Fri. Vancomycin random level ordered prior to 3rd HD on Monday 6/18.   Height: 5\' 5"  (165.1 cm) Weight: 163 lb 2.3 oz (74 kg) IBW/kg (Calculated) : 57  Temp (24hrs), Avg:97.8 F (36.6 C), Min:97.8 F (36.6 C), Max:97.8 F (36.6 C)   Recent Labs Lab 11/11/16 0926  WBC 8.4  CREATININE 8.60*    Estimated Creatinine Clearance: 5.2 mL/min (A) (by C-G formula based on SCr of 8.6 mg/dL (H)).    Allergies  Allergen Reactions  . Codeine Itching and Other (See Comments)    Reaction:  Confusion/Hallucinations   . Contrast Media [Iodinated Diagnostic Agents] Other (See Comments)    "skin peel" breaks into a blistered rash, then peel. If given Benadryl and solumedrol she will be okay  . Oxycodone Other (See Comments)    Reaction:  Confusion/Hallucinations   . Quinine Derivatives Rash  . Tape Other (See Comments)    Skin is very thin and will blister and pull off skin with any tape except paper tape.  PLEASE USE PAPER TAPE.    Antimicrobials this admission: 6/13 vancomycin>>   Dose adjustments this admission:  Microbiology results: 6/13  BCx:   6/13 MRSA PCR:  Thank you for allowing pharmacy to be a part of this patient's care.  Gardner CandleSheema M Kitrina Maurin, PharmD, BCPS Clinical Pharmacist 11/11/2016 1:16 PM

## 2016-11-11 NOTE — ED Notes (Signed)
Admission MD at bedside.  

## 2016-11-11 NOTE — ED Notes (Signed)
Pt states she is on HD and did not go to treatment on Monday. Normal days are MWF.

## 2016-11-11 NOTE — ED Triage Notes (Signed)
Pt arrived via EMS from home with daughter. Pt reports low back pain for the past week since stopping atbx. Pt states she has an infection in her lower back and the MD stopped her atbx last week. Pt also c/o being shaky. Pt reports she does not ambulate and only stands to pivot to a chair. Pt has sore on right great toe that is dry at this time no drainage noted. Pt also has dressing on back of the right lower leg. Pt states she has been to multiple rehabs in the past. Daughter currently taking care of her.

## 2016-11-11 NOTE — ED Notes (Signed)
EDP talking to family outside of the room,

## 2016-11-11 NOTE — Consult Note (Signed)
[facility] Infectious Disease     Reason for Consult: Vert osteomyelitis    Referring Physician: Nicholes Mango Date of Admission:  11/11/2016   Active Problems:   Discitis  HPI: [name] is a 83 y.o. female with ESRD on HD recently treated with 6 weeks IV vanco for osteomyelitis/discitis. Recently changed to doxycycline but had progressive back pain and intolerance of doxycycline. Back pain much worse over last few days so repeat CT repeated shows progressive destruction of vertebrae. Plan was to restart vancomycin as otpt at HD for a 12 week course.  She had missed HD due to the pain and is seen in HD today. She recently had dilaudid and is quite sleepy.  No fevers chills. WBC 8.     Past Medical History:  Diagnosis Date  . Anemia   . Anxiety   . Arthritis    gout  . Atrial fibrillation ([place])   . CHF (congestive heart failure) ([place])    has had this off and on prior to dialysis but has had several times since  . Chronic kidney disease    end stage renal insufficiency.  on dialysis M,W,F  . Diabetes mellitus without complication ([place])   . Dialysis patient [facility])    Mon.- Wed- Fri.  . Dysrhythmia    afib/flutter  . GERD (gastroesophageal reflux disease)   . Hypertension   . Neuropathy due to type 2 diabetes mellitus ([place])   . Peripheral vascular disease ([place])   . Pleural effusion   . Pulmonary hypertension ([place])   . Renal insufficiency   . Restless leg syndrome   . Shortness of breath dyspnea    with exertion  . Stroke ([place]) 06/2015   has had 1 other stroke. first one affected vision. the most recent one affected right arm   Past Surgical History:  Procedure Laterality Date  . AMPUTATION Right 11/07/2015   Procedure: AMPUTATION DIGIT ( 4TH TOE, RIGHT FOOT );  Surgeon: Algernon Huxley, MD;  Location: [facility] ORS;  Service: Vascular;  Laterality: Right;  . AMPUTATION TOE Left 06/07/2015   Procedure: AMPUTATION TOE;  Surgeon: Samara Deist, DPM;  Location: [facility] ORS;  Service:  Podiatry;  Laterality: Left;  . CARDIAC CATHETERIZATION    . CATARACT EXTRACTION, BILATERAL Bilateral   . CHOLECYSTECTOMY    . DILATION AND CURETTAGE OF UTERUS    . ENDARTERECTOMY Left 07/26/2015   Procedure: ENDARTERECTOMY CAROTID;  Surgeon: Katha Cabal, MD;  Location: [facility] ORS;  Service: Vascular;  Laterality: Left;  . EYE SURGERY    . FISTULAGRAM ([place] HX)    . HEEL DECUBITUS ULCER EXCISION Right 05/13/2016  . INCISION AND DRAINAGE ABSCESS Right 02/16/2016   Procedure: INCISION AND DRAINAGE ABSCESS and application wound vac;  Surgeon: Evaristo Bury, MD;  Location: [facility] ORS;  Service: Urology;  Laterality: Right;  . IR GENERIC HISTORICAL  08/14/2016   IR FLUORO GUIDED NEEDLE PLC ASPIRATION/INJECTION LOC 08/14/2016 [facility]-INTERV RAD  . JOINT REPLACEMENT     bilateral hip replacement  . LOWER EXTREMITY ANGIOGRAPHY Right 10/29/2016   Procedure: Lower Extremity Angiography;  Surgeon: Algernon Huxley, MD;  Location: [place] CV LAB;  Service: Cardiovascular;  Laterality: Right;  . LOWER EXTREMITY INTERVENTION  10/29/2016   Procedure: Lower Extremity Intervention;  Surgeon: Algernon Huxley, MD;  Location: [place] [facility];  Service: Cardiovascular;;  . MEDTRONIC BLADDER INTERSTEM     currently turned off for MRI in January  . PARATHYROIDECTOMY    .  PERIPHERAL VASCULAR CATHETERIZATION N/A 10/08/2014   Procedure: A/V Shuntogram/Fistulagram;  Surgeon: Algernon Huxley, MD;  Location: [place] [facility];  Service: Cardiovascular;  Laterality: N/A;  . PERIPHERAL VASCULAR CATHETERIZATION N/A 03/04/2015   Procedure: A/V Shuntogram/Fistulagram;  Surgeon: Algernon Huxley, MD;  Location: [place] [facility];  Service: Cardiovascular;  Laterality: N/A;  . PERIPHERAL VASCULAR CATHETERIZATION N/A 03/04/2015   Procedure: A/V Shunt Intervention;  Surgeon: Algernon Huxley, MD;  Location: [place] [facility];  Service: Cardiovascular;  Laterality: N/A;  . PERIPHERAL VASCULAR CATHETERIZATION N/A 09/16/2015   Procedure:  Lower Extremity Angiography;  Surgeon: Algernon Huxley, MD;  Location: [place] [facility];  Service: Cardiovascular;  Laterality: N/A;  . PERIPHERAL VASCULAR CATHETERIZATION  09/16/2015   Procedure: Lower Extremity Intervention;  Surgeon: Algernon Huxley, MD;  Location: [place] [facility];  Service: Cardiovascular;;  . PERIPHERAL VASCULAR CATHETERIZATION Right 04/27/2016   Procedure: A/V Shuntogram/Fistulagram;  Surgeon: Algernon Huxley, MD;  Location: [place] [facility];  Service: Cardiovascular;  Laterality: Right;  . PERIPHERAL VASCULAR CATHETERIZATION Left 05/14/2016   Procedure: DIALYSIS/PERMA CATHETER INSERTION;  Surgeon: Algernon Huxley, MD;  Location: [facility] OR[facility];  Service: Vascular;  Laterality: Left;  . PERIPHERAL VASCULAR CATHETERIZATION N/A 06/08/2016   Procedure: Dialysis/Perma Catheter Removal;  Surgeon: Algernon Huxley, MD;  Location: [place] [facility];  Service: Cardiovascular;  Laterality: N/A;  . REVISON OF ARTERIOVENOUS FISTULA Right 05/14/2016   Procedure: REVISON OF ARTERIOVENOUS FISTULA ( WITH ARTEGRAFT );  Surgeon: Algernon Huxley, MD;  Location: [facility] OR[facility];  Service: Vascular;  Laterality: Right;  . THYROIDECTOMY, PARTIAL    . TONSILLECTOMY    . WOUND DEBRIDEMENT Right 11/07/2015   Procedure: DEBRIDEMENT WOUND ( RIGHT HEEL AND ABDOMINAL WOUND (2) );  Surgeon: Algernon Huxley, MD;  Location: [facility];  Service: Vascular;  Laterality: Right;  . WOUND DEBRIDEMENT N/A 11/28/2015   Procedure: DEBRIDEMENT WOUND ( ABDOMINAL );  Surgeon: Algernon Huxley, MD;  Location: [facility] ORS;  Service: Vascular;  Laterality: N/A;   Social History  Substance Use Topics  . Smoking status: Never Smoker  . Smokeless tobacco: Never Used  . Alcohol use No   Family History  Problem Relation Age of Onset  . Colon cancer Mother   . Stroke Father   . Diabetes Brother     Allergies:  Allergies  Allergen Reactions  . Codeine Itching and Other (See Comments)    Reaction:  Confusion/Hallucinations   . Contrast Media  [Iodinated Diagnostic Agents] Other (See Comments)    "skin peel" breaks into a blistered rash, then peel. If given Benadryl and solumedrol she will be okay  . Oxycodone Other (See Comments)    Reaction:  Confusion/Hallucinations   . Quinine Derivatives Rash  . Tape Other (See Comments)    Skin is very thin and will blister and pull off skin with any tape except paper tape.  PLEASE USE PAPER TAPE.    Current antibiotics: Antibiotics Given (last 72 hours)    Date/Time Action Medication Dose Rate   11/11/16 1010 New Bag/Given   vancomycin (VANCOCIN) IVPB 1000 mg/200 mL premix 1,000 mg 200 mL/hr   11/11/16 1418 New Bag/Given   vancomycin (VANCOCIN) 500 mg in sodium chloride 0.9 % 100 mL IVPB 500 mg 100 mL/hr      MEDICATIONS: . calcium acetate  1,334 mg Oral TID WC  . diltiazem  120 mg Oral QHS  . [START ON 11/13/2016] epoetin (EPOGEN/PROCRIT) injection  10,000  Units Intravenous Q M,W,F-HD  . famotidine  10 mg Oral Daily  . insulin aspart  0-9 Units Subcutaneous TID WC  . multivitamin  1 tablet Oral Daily  . omega-3 acid ethyl esters  1,000 mg Oral BID  . PARoxetine  40 mg Oral QHS  . pravastatin  20 mg Oral QPM  . warfarin  2.5 mg Oral q1800  . Warfarin - Pharmacist Dosing Inpatient   Does not apply q1800    Review of Systems - unable to obtain    OBJECTIVE: Temp:  [97.8 F (36.6 C)-98 F (36.7 C)] 98 F (36.7 C) (06/13 1336) Pulse Rate:  [72-130] 72 (06/13 1336) Resp:  [12-29] 12 (06/13 1336) BP: (92-116)/(44-96) 101/47 (06/13 1336) SpO2:  [86 %-100 %] 86 % (06/13 1336) Weight:  [74 kg (163 lb 2.3 oz)] 74 kg (163 lb 2.3 oz) (06/13 0916) Physical Exam  Constitutional:  Chronically ill, very sleepy from narcotics but arousable HENT: [place]/AT, PERRLA, no scleral icterus Mouth/Throat: Oropharynx is clear and moist. No oropharyngeal exudate.  Cardiovascular: Normal rate, regular rhythm and normal heart sounds. Pulmonary/Chest: Effort normal and breath sounds normal. No  respiratory distress.  has no wheezes.  Neck = supple, no nuchal rigidity Abdominal: Soft. Bowel sounds are normal.  exhibits no distension. There is no tenderness.  Lymphadenopathy: no cervical adenopathy. No axillary adenopathy Neurological: sleepy  Skin: Skin is warm and dry. No rash noted. No erythema.  Psychiatric: sleepy   LABS: Results for orders placed or performed during the hospital encounter of 11/11/16 (from the past 48 hour(s))  CBC     Status: Abnormal   Collection Time: 11/11/16  9:26 AM  Result Value Ref Range   WBC 8.4 3.6 - 11.0 K/uL   RBC 3.26 (L) 3.80 - 5.20 MIL/uL   Hemoglobin 10.0 (L) 12.0 - 16.0 g/dL   HCT 30.3 (L) 35.0 - 47.0 %   MCV 92.9 80.0 - 100.0 fL   MCH 30.7 26.0 - 34.0 pg   MCHC 33.0 32.0 - 36.0 g/dL   RDW 17.2 (H) 11.5 - 14.5 %   Platelets 222 150 - 440 K/uL  Comprehensive metabolic panel     Status: Abnormal   Collection Time: 11/11/16  9:26 AM  Result Value Ref Range   Sodium 133 (L) 135 - 145 mmol/L   Potassium 4.5 3.5 - 5.1 mmol/L   Chloride 93 (L) 101 - 111 mmol/L   CO2 20 (L) 22 - 32 mmol/L   Glucose, Bld 73 65 - 99 mg/dL   BUN 87 (H) 6 - 20 mg/dL   Creatinine, Ser 8.60 (H) 0.44 - 1.00 mg/dL   Calcium 7.8 (L) 8.9 - 10.3 mg/dL   Total Protein 6.8 6.5 - 8.1 g/dL   Albumin 2.9 (L) 3.5 - 5.0 g/dL   AST 23 15 - 41 U/L   ALT 8 (L) 14 - 54 U/L   Alkaline Phosphatase 58 38 - 126 U/L   Total Bilirubin 0.8 0.3 - 1.2 mg/dL   GFR calc non Af Amer 4 (L) >60 mL/min   GFR calc Af Amer 4 (L) >60 mL/min    Comment: (NOTE) The eGFR has been calculated using the CKD EPI equation. This calculation has not been validated in all clinical situations. eGFR's persistently <60 mL/min signify possible Chronic Kidney Disease.    Anion gap 20 (H) 5 - 15  Phosphorus     Status: Abnormal   Collection Time: 11/11/16  9:26 AM  Result Value Ref Range  Phosphorus 8.1 (H) 2.5 - 4.6 mg/dL  Protime-INR     Status: Abnormal   Collection Time: 11/11/16  1:33 PM   Result Value Ref Range   Prothrombin Time 27.1 (H) 11.4 - 15.2 seconds   INR 2.46   Glucose, capillary     Status: None   Collection Time: 11/11/16  1:39 PM  Result Value Ref Range   Glucose-Capillary 91 65 - 99 mg/dL   Comment 1 Notify RN    No components found for: ESR, C REACTIVE PROTEIN MICRO: No results found for this or any previous visit (from the past 720 hour(s)).  IMAGING: Ct Lumbar Spine Wo Contrast  Result Date: 11/05/2016 CLINICAL DATA:  Pt states lower back pain x 57mo , pt states hx infection in lower back/discs, bilateral leg cramping/pain. EXAM: CT LUMBAR SPINE WITHOUT CONTRAST TECHNIQUE: Multidetector CT imaging of the lumbar spine was performed without intravenous contrast administration. Multiplanar CT image reconstructions were also generated. COMPARISON:  08/09/2016 FINDINGS: Segmentation: 5 lumbar type vertebrae. Alignment: Normal. Vertebrae: No acute vertebral body fracture. Schmorl's node along the inferior endplate of L1. Severe endplate destruction along the inferior endplate of L2 and superior endplate of L3 with mild increased endplate destruction compared to the prior exam 08/09/2016. Intraspinal soft tissues are not fully imaged on this examination due to poor soft tissue contrast Osteoarthritis of bilateral sacroiliac joints. Paraspinal and other soft tissues: Paraspinal soft tissue edema likely reflecting mild phlegmonous changes. No drainable fluid collection. Bilateral renal atrophy. Nonobstructing right renal calculus. Abdominal aortic atherosclerosis. Disc levels: Degenerative disc disease with disc height loss at L1-2 and L5-S1. Broad-based disc osteophyte complex at L5-S1 with moderate bilateral facet arthropathy. Bilateral facet arthropathy throughout the lumbar spine. Spinal stenosis at L2-3. Upper chest: Lung apices are clear. Other: No fluid collection or hematoma. IMPRESSION: 1. Severe endplate destruction along the cyst inferior endplate of L2 and  superior endplate of L3 with mild increased destruction compared to the prior exam of 08/09/2016 consistent with discitis-osteomyelitis with mild paraspinal phlegmonous changes. No drainable paraspinal abscess. Intraspinal soft tissues are not fully imaged on this examination due to poor soft tissue contrast Electronically Signed   By: HKathreen Devoid  On: 11/05/2016 14:27    Assessment:   Natalie STEVENis a 83y.o. female with ESRD and recent treatment for vert osteomyelitis.  She had aspiration done on initial admission with cx neg but GS with GPC.  She has had issues with myoclonus due to ceftazidime.  Main issues is pain control and missing HD sessions due to pain.   Recommendations Cont vanco- plan on 12 week course Check esr crp  Thank you very much for allowing me to participate in the care of this patient. Please call with questions.   DCheral Marker FOla Spurr MD

## 2016-11-11 NOTE — Progress Notes (Signed)
Family Meeting Note  Advance Directive:yes  Today a meeting took place with the patient and daughter   The following clinical team members were present during this meeting: Patient, daughter, M.D. in the ER  The following were discussed:Patient's diagnosis: Recurrent discitis/osteomyelitis with severe back pain, Patient's progosis: Long-term is poor and daughter understands   Discussed CODE STATUS. Patient and daughter wishes to be DO NOT RESUSCITATE    Time spent during discussion:17 mins  Dezaria Methot, MD

## 2016-11-11 NOTE — Progress Notes (Signed)
PT Cancellation Note  Patient Details Name: Helane RimaJoann Y Gong MRN: [id number] DOB: [birth year]   Cancelled Treatment:    Reason Eval/Treat Not Completed: Patient at procedure or test/unavailable  Ezekiel InaKristine S Mansfield, PT, DPT [place], Kristine S 11/11/2016, 4:33 PM

## 2016-11-11 NOTE — Progress Notes (Signed)
ANTICOAGULATION CONSULT NOTE - Initial Consult  Pharmacy Consult for Warfarin dosing Indication: atrial fibrillation  Allergies  Allergen Reactions  . Codeine Itching and Other (See Comments)    Reaction:  Confusion/Hallucinations   . Contrast Media [Iodinated Diagnostic Agents] Other (See Comments)    "skin peel" breaks into a blistered rash, then peel. If given Benadryl and solumedrol she will be okay  . Oxycodone Other (See Comments)    Reaction:  Confusion/Hallucinations   . Quinine Derivatives Rash  . Tape Other (See Comments)    Skin is very thin and will blister and pull off skin with any tape except paper tape.  PLEASE USE PAPER TAPE.    Patient Measurements: Height: 5\' 5"  (165.1 cm) Weight: 163 lb 2.3 oz (74 kg) IBW/kg (Calculated) : 57  Vital Signs: Temp: 98 F (36.7 C) (06/13 1336) Temp Source: Oral (06/13 1336) BP: 101/47 (06/13 1336) Pulse Rate: 72 (06/13 1336)  Labs:  Recent Labs  11/11/16 0926 11/11/16 1333  HGB 10.0*  --   HCT 30.3*  --   PLT 222  --   LABPROT  --  27.1*  INR  --  2.46  CREATININE 8.60*  --     Estimated Creatinine Clearance: 5.2 mL/min (A) (by C-G formula based on SCr of 8.6 mg/dL (H)).   Medical History: Past Medical History:  Diagnosis Date  . Anemia   . Anxiety   . Arthritis    gout  . Atrial fibrillation ([facility])   . CHF (congestive heart failure) ([facility])    has had this off and on prior to dialysis but has had several times since  . Chronic kidney disease    end stage renal insufficiency.  on dialysis M,W,F  . Diabetes mellitus without complication ([facility])   . Dialysis patient [facility])    Mon.- Wed- Fri.  . Dysrhythmia    afib/flutter  . GERD (gastroesophageal reflux disease)   . Hypertension   . 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 Peripheral vascular disease ([facility])   . Pleural effusion   . Pulmonary hypertension ([facility])   . Renal insufficiency   . Restless leg syndrome   . Shortness of breath dyspnea     with exertion  . Stroke ([facility]) 06/2015   has had 1 other stroke. first one affected vision. the most recent one affected right arm   Assessment: 83 yo female with PMH of A.Fib, admitted with Discitis. Pharmacy consulted for warfarin dosing and monitoring. INR on admission 2.46.  Home Regimen: warfarin 2.5mg  QD  DATE  INR  DOSE 6/13  2.46  2.5mg   Goal of Therapy:  INR 2-3 Monitor platelets by anticoagulation protocol: Yes   Plan:  Will continue patient's home dose of Warfarin 2.5mg  daily. Patient has been started on Vancomycin. Will monitor INR daily while on Abx per protocol.   Gardner CandleSheema M Delsin Copen, PharmD, BCPS Clinical Pharmacist 11/11/2016 2:09 PM

## 2016-11-11 NOTE — Progress Notes (Signed)
[facility]  ROUNDING NOTE   Subjective:   [name] admitted to [facility] on 11/11/2016 for Uremia [N19] Weakness [R53.1] Acute midline low back pain without sciatica [M54.5]  Daughter at bedside. Went to see Dr. Sampson GoonFitzgerald, Infectious disease on 6/5. She was stopped on ceftazidime due to myoclonus. She was taken off vanco and placed on doxycycline but this caused nausea.  Currently not taking any antibiotics and deteriorating.   Last dialysis Friday.   Objective:  Vital signs in last 24 hours:  Temp:  [97.8 F (36.6 C)-98 F (36.7 C)] 98 F (36.7 C) (06/13 1336) Pulse Rate:  [72-130] 72 (06/13 1336) Resp:  [12-29] 12 (06/13 1336) BP: (92-116)/(44-96) 101/47 (06/13 1336) SpO2:  [86 %-100 %] 86 % (06/13 1336) Weight:  [74 kg (163 lb 2.3 oz)] 74 kg (163 lb 2.3 oz) (06/13 0916)  Weight change:  Filed Weights   11/11/16 0916  Weight: 74 kg (163 lb 2.3 oz)    Intake/Output: No intake/output data recorded.   Intake/Output this shift:  No intake/output data recorded.  Physical Exam: General: NAD, laying in bed, frail  Head: Normocephalic, atraumatic. Moist oral mucosal membranes  Eyes: Anicteric, PERRL  Neck: Supple, trachea midline  Lungs:  Clear to auscultation  Heart: Regular rate and rhythm  Abdomen:  Soft, nontender,   Extremities: no peripheral edema.  Neurologic: Nonfocal, moving all four extremities  Skin: No lesions  Access: Right AVF    Basic Metabolic Panel:  Recent Labs Lab 11/11/16 0926  NA 133*  K 4.5  CL 93*  CO2 20*  GLUCOSE 73  BUN 87*  CREATININE 8.60*  CALCIUM 7.8*  PHOS 8.1*    Liver Function Tests:  Recent Labs Lab 11/11/16 0926  AST 23  ALT 8*  ALKPHOS 58  BILITOT 0.8  PROT 6.8  ALBUMIN 2.9*   No results for input(s): LIPASE, AMYLASE in the last 168 hours. No results for input(s): AMMONIA in the last 168 hours.  CBC:  Recent Labs Lab 11/11/16 0926  WBC 8.4  HGB 10.0*  HCT 30.3*  MCV 92.9  PLT  222    Cardiac Enzymes: No results for input(s): CKTOTAL, CKMB, CKMBINDEX, TROPONINI in the last 168 hours.  BNP: Invalid input(s): POCBNP  CBG:  Recent Labs Lab 11/11/16 1339  GLUCAP 91    Microbiology: Results for orders placed or performed during the hospital encounter of 08/09/16  Urine culture     Status: Abnormal   Collection Time: 08/09/16 10:04 PM  Result Value Ref Range Status   Specimen Description URINE, RANDOM  Final   Special Requests NONE  Final   Culture (A)  Final    >=100,000 COLONIES/mL GROUP B STREP(S.AGALACTIAE)ISOLATED TESTING AGAINST S. AGALACTIAE NOT ROUTINELY PERFORMED DUE TO PREDICTABILITY OF AMP/PEN/VAN SUSCEPTIBILITY. Performed at [facility], [address] [place], [place] [address]    Report Status 08/11/2016 FINAL  Final  Blood culture (routine x 2)     Status: None   Collection Time: 08/09/16 11:54 PM  Result Value Ref Range Status   Specimen Description BLOOD LEFT UPPER ARM  Final   Special Requests   Final    BOTTLES DRAWN AEROBIC AND ANAEROBIC BCHV HIGH VOLUME   Culture NO GROWTH 5 DAYS  Final   Report Status 08/15/2016 FINAL  Final  Blood culture (routine x 2)     Status: None   Collection Time: 08/09/16 11:59 PM  Result Value Ref Range Status   Specimen Description  BLOOD LEFT FOREARM  Final   Special Requests   Final    BOTTLES DRAWN AEROBIC AND ANAEROBIC BCAV ADEQUATE VOLUME   Culture NO GROWTH 5 DAYS  Final   Report Status 08/15/2016 FINAL  Final  MRSA PCR Screening     Status: None   Collection Time: 08/10/16  2:29 AM  Result Value Ref Range Status   MRSA by PCR NEGATIVE NEGATIVE Final    Comment:        The GeneXpert MRSA Assay (FDA approved for NASAL specimens only), is one component of a comprehensive MRSA colonization surveillance program. It is not intended to diagnose MRSA infection nor to guide or monitor treatment for MRSA infections.   Culture, fungus without smear     Status: None   Collection  Time: 08/14/16 11:15 AM  Result Value Ref Range Status   Specimen Description ABSCESS  Final   Special Requests Normal  Final   Culture   Final    NO FUNGUS ISOLATED AFTER 21 DAYS Performed at [facility], [address], [place], [place] [address]    Report Status 09/04/2016 FINAL  Final  Aerobic/Anaerobic Culture (surgical/deep wound)     Status: None   Collection Time: 08/14/16 11:15 AM  Result Value Ref Range Status   Specimen Description ABSCESS L2 L3  Final   Special Requests Normal  Final   Gram Stain   Final    ABUNDANT WBC PRESENT, PREDOMINANTLY PMN RARE GRAM POSITIVE COCCI IN PAIRS    Culture   Final    No growth aerobically or anaerobically. Performed at [facility], [address], [place], [place] [address]    Report Status 08/19/2016 FINAL  Final  Fungus Culture With Stain     Status: None   Collection Time: 08/14/16 11:15 AM  Result Value Ref Range Status   Fungus Stain Final report  Final   Fungus (Mycology) Culture Final report  Final    Comment: (NOTE) Performed At: [facility] [address] [place], [place] [id number] Mila Homer MD [id number]    Fungal Source L2 L3 ABCESS  Final  Acid Fast Culture with reflexed sensitivities     Status: None   Collection Time: 08/14/16 11:15 AM  Result Value Ref Range Status   Acid Fast Culture Negative  Final    Comment: (NOTE) No acid fast bacilli isolated after 6 weeks. Performed At: [facility] [address] [place], [place] [id number] Mila Homer MD VO:[id number]    Source of Sample L2 L3 ABCESS  Final  Acid Fast Smear (AFB)     Status: None   Collection Time: 08/14/16 11:15 AM  Result Value Ref Range Status   AFB Specimen Processing Concentration  Final   Acid Fast Smear Negative  Final    Comment: (NOTE) Performed At: [facility] [address] [place], [place] [id number] Mila Homer MD FI:[id number]    Source (AFB) L2 L3 ABCESS  Final   Fungus Culture Result     Status: None   Collection Time: 08/14/16 11:15 AM  Result Value Ref Range Status   Result 1 Comment  Final    Comment: (NOTE) KOH/Calcofluor preparation:  no fungus observed. Performed At: [facility] [address] [place], [place] [id number] Mila Homer MD WF:[id number]   Fungal organism reflex     Status: None   Collection Time: 08/14/16 11:15 AM  Result Value Ref Range Status   Fungal result 1 Comment  Final    Comment: (NOTE) No yeast or mold isolated after 4 weeks. Performed At: [facility] [address] [place], [place] [id number] Mila Homer MD [id number]     Coagulation Studies:  Recent Labs  11/11/16 1333  LABPROT 27.1*  INR 2.46    Urinalysis: No results for input(s): COLORURINE, LABSPEC, PHURINE, GLUCOSEU, HGBUR, BILIRUBINUR, KETONESUR, PROTEINUR, UROBILINOGEN, NITRITE, LEUKOCYTESUR in the last 72 hours.  Invalid input(s): APPERANCEUR    Imaging: No results found.   Medications:   . vancomycin 500 mg (11/11/16 1418)  . vancomycin     . calcium acetate  1,334 mg Oral TID WC  . diltiazem  120 mg Oral QHS  . famotidine  10 mg Oral Daily  . insulin aspart  0-9 Units Subcutaneous TID WC  . multivitamin  1 tablet Oral Daily  . omega-3 acid ethyl esters  1,000 mg Oral BID  . PARoxetine  40 mg Oral QHS  . pravastatin  20 mg Oral QPM  . warfarin  2.5 mg Oral q1800  . Warfarin - Pharmacist Dosing Inpatient   Does not apply q1800   acetaminophen **OR** acetaminophen, albuterol, docusate sodium, ondansetron **OR** ondansetron (ZOFRAN) IV, polyethylene glycol, traMADol  Assessment/ Plan:  [name] is a 83 y.o. white female with atrial fibrillation, hypertension, gout, insulin-dependent diabetes mellitus type 2, diabetic neuropathy, CVA with right visual field blindness, hyperlipidemia, major depressive disorder, overactive bladder with incontinence, generalized anxiety disorder,  congestive heart failure diastolic, parathyroidectomy, and osteoarthritis. Currently with discitis  MWF [facility] [address]   1. End Stage Renal Disease: MWF. Missed Monday . Orders for today.   2. Discitis:  - Appreciate ID input - IV vancomycin   3. Hypertension:   - diltiazem  4. Secondary Hyperparathyroidism:   - calcium acetate   5. Anemia of chronic kidney disease: hemoglobin 10 - epo 4000 with HD treatment.    LOS: 0 Zehava Turski 6/13/20182:39 PM

## 2016-11-11 NOTE — H&P (Signed)
[facility] NAME: [name]    MR#:  [id number]  DATE OF BIRTH:  07/15/1933  DATE OF ADMISSION:  11/11/2016  PRIMARY CARE PHYSICIAN: Hortencia Pilar, MD   REQUESTING/REFERRING PHYSICIAN: Dr Vicente Males  CHIEF COMPLAINT:   Worsening back pain. Not able to ambulate. HISTORY OF PRESENT ILLNESS:  [name]  is a 83 y.o. female with a known history ofEnd stage renal disease on hemodialysis, chronic anemia, recently diagnosed with L2-3 discitis/osteomyelitis will finish a round of IV vancomycin and doxycycline that was discontinued about a week and a half ago. Patient's back pain continued to get worse to the point she was unable to ambulate or sit up in the bed without significant pain. Given her increasing pain she came to the emergency room now is being admitted for further evaluation and management. She had a CT of the lumbar spine that was ordered by Dr. Ola Spurr as outpatient shows worsening/destruction around the L2-L3 vertebra. Patient needs to be started back on IV vancomycin.  She is being admitted for further evaluation and management for recurrent discitis/severe back pain  PAST MEDICAL HISTORY:   Past Medical History:  Diagnosis Date  . Anemia   . Anxiety   . Arthritis    gout  . Atrial fibrillation ([place])   . CHF (congestive heart failure) ([place])    has had this off and on prior to dialysis but has had several times since  . Chronic kidney disease    end stage renal insufficiency.  on dialysis M,W,F  . Diabetes mellitus without complication ([place])   . Dialysis patient [facility])    Mon.- Wed- Fri.  . Dysrhythmia    afib/flutter  . GERD (gastroesophageal reflux disease)   . Hypertension   . Neuropathy due to type 2 diabetes mellitus ([place])   . Peripheral vascular disease ([place])   . Pleural effusion   . Pulmonary hypertension ([place])   . Renal insufficiency   . Restless leg syndrome   . Shortness of breath  dyspnea    with exertion  . Stroke ([place]) 06/2015   has had 1 other stroke. first one affected vision. the most recent one affected right arm    PAST SURGICAL HISTOIRY:   Past Surgical History:  Procedure Laterality Date  . AMPUTATION Right 11/07/2015   Procedure: AMPUTATION DIGIT ( 4TH TOE, RIGHT FOOT );  Surgeon: Algernon Huxley, MD;  Location: [facility] ORS;  Service: Vascular;  Laterality: Right;  . AMPUTATION TOE Left 06/07/2015   Procedure: AMPUTATION TOE;  Surgeon: Samara Deist, DPM;  Location: [facility] ORS;  Service: Podiatry;  Laterality: Left;  . CARDIAC CATHETERIZATION    . CATARACT EXTRACTION, BILATERAL Bilateral   . CHOLECYSTECTOMY    . DILATION AND CURETTAGE OF UTERUS    . ENDARTERECTOMY Left 07/26/2015   Procedure: ENDARTERECTOMY CAROTID;  Surgeon: Katha Cabal, MD;  Location: [facility] ORS;  Service: Vascular;  Laterality: Left;  . EYE SURGERY    . FISTULAGRAM ([place] [facility])    . HEEL DECUBITUS ULCER EXCISION Right 05/13/2016  . INCISION AND DRAINAGE ABSCESS Right 02/16/2016   Procedure: INCISION AND DRAINAGE ABSCESS and application wound vac;  Surgeon: Evaristo Bury, MD;  Location: [facility] ORS;  Service: Urology;  Laterality: Right;  . IR GENERIC HISTORICAL  08/14/2016   IR FLUORO GUIDED NEEDLE PLC ASPIRATION/INJECTION LOC 08/14/2016 [facility]-INTERV RAD  . JOINT REPLACEMENT     bilateral hip replacement  . LOWER EXTREMITY ANGIOGRAPHY Right  10/29/2016   Procedure: Lower Extremity Angiography;  Surgeon: Algernon Huxley, MD;  Location: [place] [facility];  Service: Cardiovascular;  Laterality: Right;  . LOWER EXTREMITY INTERVENTION  10/29/2016   Procedure: Lower Extremity Intervention;  Surgeon: Algernon Huxley, MD;  Location: [place] [facility];  Service: Cardiovascular;;  . MEDTRONIC BLADDER INTERSTEM     currently turned off for MRI in January  . PARATHYROIDECTOMY    . PERIPHERAL VASCULAR CATHETERIZATION N/A 10/08/2014   Procedure: A/V Shuntogram/Fistulagram;  Surgeon: Algernon Huxley, MD;  Location:  [place] [facility];  Service: Cardiovascular;  Laterality: N/A;  . PERIPHERAL VASCULAR CATHETERIZATION N/A 03/04/2015   Procedure: A/V Shuntogram/Fistulagram;  Surgeon: Algernon Huxley, MD;  Location: [place] [facility];  Service: Cardiovascular;  Laterality: N/A;  . PERIPHERAL VASCULAR CATHETERIZATION N/A 03/04/2015   Procedure: A/V Shunt Intervention;  Surgeon: Algernon Huxley, MD;  Location: [place] [facility];  Service: Cardiovascular;  Laterality: N/A;  . PERIPHERAL VASCULAR CATHETERIZATION N/A 09/16/2015   Procedure: Lower Extremity Angiography;  Surgeon: Algernon Huxley, MD;  Location: [place] [facility];  Service: Cardiovascular;  Laterality: N/A;  . PERIPHERAL VASCULAR CATHETERIZATION  09/16/2015   Procedure: Lower Extremity Intervention;  Surgeon: Algernon Huxley, MD;  Location: [place] [facility];  Service: Cardiovascular;;  . PERIPHERAL VASCULAR CATHETERIZATION Right 04/27/2016   Procedure: A/V Shuntogram/Fistulagram;  Surgeon: Algernon Huxley, MD;  Location: [place] [facility];  Service: Cardiovascular;  Laterality: Right;  . PERIPHERAL VASCULAR CATHETERIZATION Left 05/14/2016   Procedure: DIALYSIS/PERMA CATHETER INSERTION;  Surgeon: Algernon Huxley, MD;  Location: [facility] ORS;  Service: Vascular;  Laterality: Left;  . PERIPHERAL VASCULAR CATHETERIZATION N/A 06/08/2016   Procedure: Dialysis/Perma Catheter Removal;  Surgeon: Algernon Huxley, MD;  Location: [place] [facility];  Service: Cardiovascular;  Laterality: N/A;  . REVISON OF ARTERIOVENOUS FISTULA Right 05/14/2016   Procedure: REVISON OF ARTERIOVENOUS FISTULA ( WITH ARTEGRAFT );  Surgeon: Algernon Huxley, MD;  Location: [facility] ORS;  Service: Vascular;  Laterality: Right;  . THYROIDECTOMY, PARTIAL    . TONSILLECTOMY    . WOUND DEBRIDEMENT Right 11/07/2015   Procedure: DEBRIDEMENT WOUND ( RIGHT HEEL AND ABDOMINAL WOUND (2) );  Surgeon: Algernon Huxley, MD;  Location: [facility] ORS;  Service: Vascular;  Laterality: Right;  . WOUND DEBRIDEMENT N/A 11/28/2015    Procedure: DEBRIDEMENT WOUND ( ABDOMINAL );  Surgeon: Algernon Huxley, MD;  Location: [facility] ORS;  Service: Vascular;  Laterality: N/A;    SOCIAL HISTORY:   Social History  Substance Use Topics  . Smoking status: Never Smoker  . Smokeless tobacco: Never Used  . Alcohol use No    FAMILY HISTORY:   Family History  Problem Relation Age of Onset  . Colon cancer Mother   . Stroke Father   . Diabetes Brother     DRUG ALLERGIES:   Allergies  Allergen Reactions  . Codeine Itching and Other (See Comments)    Reaction:  Confusion/Hallucinations   . Contrast Media [Iodinated Diagnostic Agents] Other (See Comments)    "skin peel" breaks into a blistered rash, then peel. If given Benadryl and solumedrol she will be okay  . Oxycodone Other (See Comments)    Reaction:  Confusion/Hallucinations   . Quinine Derivatives Rash  . Tape Other (See Comments)    Skin is very thin and will blister and pull off skin with any tape except paper tape.  PLEASE USE PAPER TAPE.    REVIEW OF SYSTEMS:  Review of Systems  Constitutional: Negative for chills, fever and weight loss.  HENT: Negative for ear discharge, ear pain and nosebleeds.   Eyes: Negative for blurred vision, pain and discharge.  Respiratory: Negative for sputum production, shortness of breath, wheezing and stridor.   Cardiovascular: Negative for chest pain, palpitations, orthopnea and PND.  Gastrointestinal: Negative for abdominal pain, diarrhea, nausea and vomiting.  Genitourinary: Negative for frequency and urgency.  Musculoskeletal: Positive for back pain. Negative for joint pain.  Neurological: Positive for weakness. Negative for sensory change, speech change and focal weakness.  Psychiatric/Behavioral: Negative for depression and hallucinations. The patient is not nervous/anxious.      MEDICATIONS AT HOME:   Prior to Admission medications   Medication Sig Start Date End Date Taking? Authorizing Provider  acetaminophen (TYLENOL)  650 MG CR tablet Take 1,300 mg by mouth 2 (two) times daily.   Yes [provider]  albuterol (PROVENTIL HFA;VENTOLIN HFA) 108 (90 Base) MCG/ACT inhaler Inhale 1 puff into the lungs every 6 (six) hours as needed for wheezing or shortness of breath.    Yes [provider]  Biotin 1 MG CAPS Take 1 mg by mouth daily.    Yes [provider]  calcium acetate (PHOSLO) 667 MG tablet Take 1,334 mg by mouth 3 (three) times daily with meals. With any food   Yes [provider]  diltiazem (CARDIZEM CD) 120 MG 24 hr capsule Take 120 mg by mouth at bedtime.  10/15/16  Yes [provider]  docusate sodium (COLACE) 100 MG capsule Take 200 mg by mouth 2 (two) times daily.    Yes [provider]  folic acid-vitamin b complex-vitamin c-selenium-zinc (DIALYVITE) 3 MG TABS tablet Take 1 tablet by mouth daily.   Yes [provider]  furosemide (LASIX) 80 MG tablet Take 80 mg by mouth 2 (two) times daily.   Yes [provider]  gabapentin (NEURONTIN) 100 MG capsule Take 100 mg by mouth every Tuesday, Thursday, Saturday, and Sunday. At bedtime   Yes [provider]  gabapentin (NEURONTIN) 300 MG capsule Take 300 mg by mouth every Monday, Wednesday, and Friday at 8 PM. At bedtime   Yes [provider]  insulin detemir (LEVEMIR) 100 UNIT/ML injection Inject 0.06 mLs (6 Units total) into the skin 2 (two) times daily. Patient taking differently: Inject 10 Units into the skin at bedtime.  02/18/16  Yes Mody, Ulice Bold, MD  lidocaine-prilocaine (EMLA) cream Apply 1 application topically as needed (port access). Mon, Wed, Fri before dialysis   Yes [provider]  Omega-3 Fatty Acids (FISH OIL) 1000 MG CAPS Take 1,000 mg by mouth 2 (two) times daily.   Yes [provider]  PARoxetine (PAXIL) 40 MG tablet Take 40 mg by mouth at bedtime.    Yes [provider]  polyethylene glycol (MIRALAX / GLYCOLAX) packet Take 17 g by  mouth as needed.    Yes [provider]  pravastatin (PRAVACHOL) 20 MG tablet Take 20 mg by mouth every evening.    Yes [provider]  ranitidine (ZANTAC) 150 MG tablet Take 150 mg by mouth at bedtime.    Yes [provider]  traMADol (ULTRAM) 50 MG tablet Take 1 tablet (50 mg total) by mouth every 6 (six) hours as needed for moderate pain. 08/17/16  Yes Theodoro Grist, MD  warfarin (COUMADIN) 2.5 MG tablet Take 2.5 mg by mouth daily at 6 PM.    Yes [provider]  vancomycin (VANCOCIN) 1-5  GM/200ML-% SOLN Inject 200 mLs (1,000 mg total) into the vein every Monday, Wednesday, and Friday with hemodialysis. Patient not taking: Reported on 10/08/2016 08/19/16   Theodoro Grist, MD      VITAL SIGNS:  Blood pressure (!) 124/58, pulse 68, temperature 97.8 F (36.6 C), temperature source Oral, resp. rate 16, height _0  (1.651 m), weight 71 kg (156 lb 8.4 oz), SpO2 100 %.  PHYSICAL EXAMINATION:  GENERAL:  83 y.o.-year-old patient lying in the bed with no acute distress. Chronically ill EYES: Pupils equal, round, reactive to light and accommodation. No scleral icterus. Extraocular muscles intact.  HEENT: Head atraumatic, normocephalic. Oropharynx and nasopharynx clear.  NECK:  Supple, no jugular venous distention. No thyroid enlargement, no tenderness.  LUNGS: Normal breath sounds bilaterally, no wheezing, rales,rhonchi or crepitation. No use of accessory muscles of respiration.  CARDIOVASCULAR: S1, S2 normal. No murmurs, rubs, or gallops.  ABDOMEN: Soft, nontender, nondistended. Bowel sounds present. No organomegaly or mass.  EXTREMITIES: No pedal edema, cyanosis, or clubbing.  NEUROLOGIC: Cranial nerves II through XII are intact. Muscle strength 4/5 in all extremities. Sensation intact. Gait not checked.  PSYCHIATRIC: The patient is alert and oriented x 3.  SKIN: No obvious rash, lesion, or ulcer.   LABORATORY PANEL:   CBC  Recent Labs Lab 11/11/16 0926   WBC 8.4  HGB 10.0*  HCT 30.3*  PLT 222   ------------------------------------------------------------------------------------------------------------------  Chemistries   Recent Labs Lab 11/11/16 0926  NA 133*  K 4.5  CL 93*  CO2 20*  GLUCOSE 73  BUN 87*  CREATININE 8.60*  CALCIUM 7.8*  AST 23  ALT 8*  ALKPHOS 58  BILITOT 0.8   ------------------------------------------------------------------------------------------------------------------  Cardiac Enzymes No results for input(s): TROPONINI in the last 168 hours. ------------------------------------------------------------------------------------------------------------------  RADIOLOGY:  No results found.  EKG:    IMPRESSION AND PLAN:   [name]  is a 83 y.o. female with a known history ofEnd stage renal disease on hemodialysis, chronic anemia, recently diagnosed with L23 discitis/osteomyelitis will finish a round of IV vancomycin and doxycycline that was discontinued about a week and a half ago. Patient's back pain continued to get worse to the point she was unable to ambulate or sit up in the bed without significant pain.  1. severe back pain lower secondary to discitis/osteomyelitis L2-3 vertebrae -Check ESR and CRP -Vancomycin per pharmacy with dialysis -Infectious disease consultation. Case discussed with Dr. Ola Spurr  2. End stage renal disease -Nephrology consultation for in-house hemodialysis  3. Anemia of chronic disease -Transfuse as needed  4. History of atrial fibrillation on oral anticoagulation - continue warfarin -Pharmacy consult for managing warfarin dosage  5. DVT prophylaxis on warfarin  Discussed with daughter in the emergency room.  All the records are reviewed and case discussed with ED provider. Management plans discussed with the patient, family and they are in agreement.  CODE STATUS: DNR  TOTAL TIME TAKING CARE OF THIS PATIENT:50** minutes.    Shirlyn Savin M.D on  11/11/2016 at 3:25 PM  Between 7am to 6pm - Pager - [phone]  After 6pm go to [email or web address] - password [email or web address] [facility]  [facility]  Office  [phone]  CC: Primary care physician; Hortencia Pilar, MD

## 2016-11-11 NOTE — ED Provider Notes (Signed)
[facility] Emergency Department Provider Note  Time seen: 9:22 AM  I have reviewed the triage vital signs and the nursing notes.   HISTORY  Chief Complaint Back Pain and Shaking    HPI [name] is a 83 y.o. female with multiple medical problems, recently diagnosed with discitis and March has been on IV antibiotics until approximately one month ago when she was transitioned to oral doxycycline, was in a rehabilitation facility, but is now at home cared for by her daughter, presents with increased back pain and trembling. According to the daughter the patient was hospitalized in March for discitis was in the hospital 1-2 weeks for IV antibiotics was discharged to a rehabilitation facility with continued IV antibiotics. Daughter states the patient has been able to stand and pivot it, she has not been truly ambulatory for 3 years. She states over the past 1 week since stopping the oral antibiotic 11/03/16 the patient has deteriorated. She is complaining of significant amounts of pain despite use of hydrocodone at home. For the past one week she has not been able to stand and support her weight. Daughter states she has had to physically move the patient and over the past 2-3 days the patient has not participated in assisting to move at all. She refused to go to dialysis on Monday because she was in too much pain, was in too much pain to get out of bed today (was supposed to have dialysis today). Patient requested her daughter called EMS to bring her to the hospital because she is in too much pain and did not believe she could go to dialysis again. Patient also states she is shaking somewhat which will happen occasionally when she misses dialysis. Patient currently describes her back pain as severe, denies fever, nausea or vomiting. Patient sees Dr. Sampson Goon, who recently performed another CT scan and recommended the patient be restarted back on IV vancomycin.  Past Medical  History:  Diagnosis Date  . Anemia   . Anxiety   . Arthritis    gout  . Atrial fibrillation (HCC)   . CHF (congestive heart failure) (HCC)    has had this off and on prior to dialysis but has had several times since  . Chronic kidney disease    end stage renal insufficiency.  on dialysis M,W,F  . Diabetes mellitus without complication (HCC)   . Dialysis patient [facility])    Mon.- Wed- Fri.  . Dysrhythmia    afib/flutter  . GERD (gastroesophageal reflux disease)   . Hypertension   . 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 Peripheral vascular disease (HCC)   . Pleural effusion   . Pulmonary hypertension (HCC)   . Renal insufficiency   . Restless leg syndrome   . Shortness of breath dyspnea    with exertion  . Stroke (HCC) 06/2015   has had 1 other stroke. first one affected vision. the most recent one affected right arm    Patient Active Problem List   Diagnosis Date Noted  . Lower back pain 08/17/2016  . Hyponatremia 08/17/2016  . Hyperkalemia 08/17/2016  . Heel ulcer (HCC) 08/10/2016  . Discitis 08/09/2016  . Atherosclerosis of native arteries of the extremities with ulceration (HCC) 04/14/2016  . Kidney dialysis as the cause of abnormal reaction of the patient, or of later complication, without mention of misadventure at the time of the procedure (CODE) 03/03/2016  . Pressure injury of skin 02/18/2016  . Osteomyelitis (  HCC) 02/14/2016  . Hematoma 10/12/2015  . Leg pain, right 09/14/2015  . PVD (peripheral vascular disease) with claudication (HCC) 09/14/2015  . Pneumonia 08/13/2015  . Carotid stenosis 07/26/2015  . Amputation of fifth toe, left, traumatic (HCC) 06/24/2015  . Diabetes mellitus (HCC) 06/23/2015  . Diabetic neuropathy (HCC) 06/23/2015  . Chronic atrial fibrillation (HCC) 06/23/2015  . Left carotid artery stenosis 06/23/2015  . Cerebral infarction (HCC) 06/21/2015  . Pressure ulcer 06/19/2015  . End stage renal disease on dialysis (HCC)  06/18/2015  . Respiratory distress 12/11/2014    Past Surgical History:  Procedure Laterality Date  . AMPUTATION Right 11/07/2015   Procedure: AMPUTATION DIGIT ( 4TH TOE, RIGHT FOOT );  Surgeon: Annice Needy, MD;  Location: [facility] ORS;  Service: Vascular;  Laterality: Right;  . AMPUTATION TOE Left 06/07/2015   Procedure: AMPUTATION TOE;  Surgeon: Gwyneth Revels, DPM;  Location: [facility] ORS;  Service: Podiatry;  Laterality: Left;  . CARDIAC CATHETERIZATION    . CATARACT EXTRACTION, BILATERAL Bilateral   . CHOLECYSTECTOMY    . DILATION AND CURETTAGE OF UTERUS    . ENDARTERECTOMY Left 07/26/2015   Procedure: ENDARTERECTOMY CAROTID;  Surgeon: Renford Dills, MD;  Location: [facility] ORS;  Service: Vascular;  Laterality: Left;  . EYE SURGERY    . FISTULAGRAM ([facility] HX)    . HEEL DECUBITUS ULCER EXCISION Right 05/13/2016  . INCISION AND DRAINAGE ABSCESS Right 02/16/2016   Procedure: INCISION AND DRAINAGE ABSCESS and application wound vac;  Surgeon: Bertram Denver, MD;  Location: [facility] ORS;  Service: Urology;  Laterality: Right;  . IR GENERIC HISTORICAL  08/14/2016   IR FLUORO GUIDED NEEDLE PLC ASPIRATION/INJECTION LOC 08/14/2016 [facility]-INTERV RAD  . JOINT REPLACEMENT     bilateral hip replacement  . LOWER EXTREMITY ANGIOGRAPHY Right 10/29/2016   Procedure: Lower Extremity Angiography;  Surgeon: Annice Needy, MD;  Location: [facility] INVASIVE CV LAB;  Service: Cardiovascular;  Laterality: Right;  . LOWER EXTREMITY INTERVENTION  10/29/2016   Procedure: Lower Extremity Intervention;  Surgeon: Annice Needy, MD;  Location: [facility] INVASIVE CV LAB;  Service: Cardiovascular;;  . MEDTRONIC BLADDER INTERSTEM     currently turned off for MRI in January  . PARATHYROIDECTOMY    . PERIPHERAL VASCULAR CATHETERIZATION N/A 10/08/2014   Procedure: A/V Shuntogram/Fistulagram;  Surgeon: Annice Needy, MD;  Location: [facility] INVASIVE CV LAB;  Service: Cardiovascular;  Laterality: N/A;  . PERIPHERAL VASCULAR CATHETERIZATION N/A 03/04/2015    Procedure: A/V Shuntogram/Fistulagram;  Surgeon: Annice Needy, MD;  Location: [facility] INVASIVE CV LAB;  Service: Cardiovascular;  Laterality: N/A;  . PERIPHERAL VASCULAR CATHETERIZATION N/A 03/04/2015   Procedure: A/V Shunt Intervention;  Surgeon: Annice Needy, MD;  Location: [facility] INVASIVE CV LAB;  Service: Cardiovascular;  Laterality: N/A;  . PERIPHERAL VASCULAR CATHETERIZATION N/A 09/16/2015   Procedure: Lower Extremity Angiography;  Surgeon: Annice Needy, MD;  Location: [facility] INVASIVE CV LAB;  Service: Cardiovascular;  Laterality: N/A;  . PERIPHERAL VASCULAR CATHETERIZATION  09/16/2015   Procedure: Lower Extremity Intervention;  Surgeon: Annice Needy, MD;  Location: [facility] INVASIVE CV LAB;  Service: Cardiovascular;;  . PERIPHERAL VASCULAR CATHETERIZATION Right 04/27/2016   Procedure: A/V Shuntogram/Fistulagram;  Surgeon: Annice Needy, MD;  Location: [facility] INVASIVE CV LAB;  Service: Cardiovascular;  Laterality: Right;  . PERIPHERAL VASCULAR CATHETERIZATION Left 05/14/2016   Procedure: DIALYSIS/PERMA CATHETER INSERTION;  Surgeon: Annice Needy, MD;  Location: [facility] ORS;  Service: Vascular;  Laterality: Left;  . PERIPHERAL VASCULAR CATHETERIZATION N/A 06/08/2016  Procedure: Dialysis/Perma Catheter Removal;  Surgeon: Annice NeedyJason S Dew, MD;  Location: [facility] INVASIVE CV LAB;  Service: Cardiovascular;  Laterality: N/A;  . REVISON OF ARTERIOVENOUS FISTULA Right 05/14/2016   Procedure: REVISON OF ARTERIOVENOUS FISTULA ( WITH ARTEGRAFT );  Surgeon: Annice NeedyJason S Dew, MD;  Location: [facility] ORS;  Service: Vascular;  Laterality: Right;  . THYROIDECTOMY, PARTIAL    . TONSILLECTOMY    . WOUND DEBRIDEMENT Right 11/07/2015   Procedure: DEBRIDEMENT WOUND ( RIGHT HEEL AND ABDOMINAL WOUND (2) );  Surgeon: Annice NeedyJason S Dew, MD;  Location: [facility] ORS;  Service: Vascular;  Laterality: Right;  . WOUND DEBRIDEMENT N/A 11/28/2015   Procedure: DEBRIDEMENT WOUND ( ABDOMINAL );  Surgeon: Annice NeedyJason S Dew, MD;  Location: [facility] ORS;  Service: Vascular;  Laterality: N/A;     Prior to Admission medications   Medication Sig Start Date End Date Taking? Authorizing Provider  diltiazem (CARDIZEM CD) 120 MG 24 hr capsule Take 120 mg by mouth daily. 10/15/16  Yes [provider]  acetaminophen (TYLENOL) 650 MG CR tablet Take 1,300 mg by mouth 2 (two) times daily.    [provider]  albuterol (PROVENTIL HFA;VENTOLIN HFA) 108 (90 Base) MCG/ACT inhaler Inhale 1 puff into the lungs every 6 (six) hours as needed for wheezing or shortness of breath.     [provider]  Biotin 1 MG CAPS Take 1 mg by mouth daily.     [provider]  calcium acetate (PHOSLO) 667 MG tablet Take 1,334 mg by mouth 3 (three) times daily with meals. With any food    [provider]  cefTAZidime 2 g in dextrose 5 % 50 mL Inject 2 g into the vein every Monday, Wednesday, and Friday with hemodialysis. Patient not taking: Reported on 10/27/2016 08/17/16   Katharina CaperVaickute, Rima, MD  diltiazem [facility](TIAZAC) 120 MG 24 hr capsule Take 120 mg by mouth at bedtime.     [provider]  docusate sodium (COLACE) 100 MG capsule Take 100 mg by mouth daily.    [provider]  doxycycline (VIBRAMYCIN) 100 MG capsule Take 100 mg by mouth 2 (two) times daily.    [provider]  folic acid-vitamin b complex-vitamin c-selenium-zinc (DIALYVITE) 3 MG TABS tablet Take 1 tablet by mouth daily.    [provider]  furosemide (LASIX) 80 MG tablet Take 80 mg by mouth 2 (two) times daily.    [provider]  gabapentin (NEURONTIN) 100 MG capsule Take 100 mg by mouth every Tuesday, Thursday, Saturday, and Sunday. At bedtime    [provider]  gabapentin (NEURONTIN) 300 MG capsule Take 300 mg by mouth every Monday, Wednesday, and Friday at 8 PM. At bedtime    [provider]  insulin detemir (LEVEMIR) 100 UNIT/ML injection Inject 0.06 mLs (6 Units total) into the skin 2 (two) times daily. Patient taking differently: Inject 10 Units  into the skin at bedtime.  02/18/16   Adrian SaranMody, Sital, MD  lidocaine-prilocaine (EMLA) cream Apply 1 application topically as needed (port access). Mon, Wed, Fri before dialysis    [provider]  Omega-3 Fatty Acids (FISH OIL) 1000 MG CAPS Take 1,000 mg by mouth 2 (two) times daily.    [provider]  PARoxetine (PAXIL) 40 MG tablet Take 40 mg by mouth at bedtime.     [provider]  polyethylene glycol (MIRALAX / GLYCOLAX) packet Take 17 g by mouth as needed.     [provider]  pravastatin (PRAVACHOL) 40 MG  tablet Take 20 mg by mouth every evening.     [provider]  ranitidine (ZANTAC) 150 MG tablet Take 150 mg by mouth at bedtime.     [provider]  traMADol (ULTRAM) 50 MG tablet Take 1 tablet (50 mg total) by mouth every 6 (six) hours as needed for moderate pain. 08/17/16   Katharina Caper, MD  vancomycin (VANCOCIN) 1-5 GM/200ML-% SOLN Inject 200 mLs (1,000 mg total) into the vein every Monday, Wednesday, and Friday with hemodialysis. Patient not taking: Reported on 10/08/2016 08/19/16   Katharina Caper, MD  warfarin (COUMADIN) 4 MG tablet Take 3.5 mg by mouth daily at 8 pm. Patient take 4mg  on Tuesday and Thursday. 2mg  on all other days.    [provider]    Allergies  Allergen Reactions  . Codeine Itching and Other (See Comments)    Reaction:  Confusion/Hallucinations   . Contrast Media [Iodinated Diagnostic Agents] Other (See Comments)    "skin peel" breaks into a blistered rash, then peel. If given Benadryl and solumedrol she will be okay  . Oxycodone Other (See Comments)    Reaction:  Confusion/Hallucinations   . Quinine Derivatives Rash  . Tape Other (See Comments)    Skin is very thin and will blister and pull off skin with any tape except paper tape.  PLEASE USE PAPER TAPE.    Family History  Problem Relation Age of Onset  . Colon cancer Mother   . Stroke Father   . Diabetes Brother     Social History Social  History  Substance Use Topics  . Smoking status: Never Smoker  . Smokeless tobacco: Never Used  . Alcohol use No    Review of Systems Constitutional: Negative for fever. Cardiovascular: Negative for chest pain. Respiratory: Negative for shortness of breath. Gastrointestinal: Negative for abdominal pain, vomiting Genitourinary: Makes little urine each day. Hemodialysis Monday, Wednesday, Friday. Has not had dialysis since this past Friday. Musculoskeletal: Severe lower back pain Skin: Negative for rash. Neurological: Negative for headache All other ROS negative  ____________________________________________   PHYSICAL EXAM:  VITAL SIGNS: ED Triage Vitals  Enc Vitals Group     BP 11/11/16 0916 (!) 112/96     Pulse Rate 11/11/16 0916 97     Resp 11/11/16 0916 20     Temp 11/11/16 0916 97.8 F (36.6 C)     Temp Source 11/11/16 0916 Oral     SpO2 11/11/16 0912 94 %     Weight 11/11/16 0916 163 lb 2.3 oz (74 kg)     Height 11/11/16 0916 5\' 5"  (1.651 m)     Head Circumference --      Peak Flow --      Pain Score 11/11/16 0912 10     Pain Loc --      Pain Edu? --      Excl. in GC? --     Constitutional: Alert and oriented. Mild to moderate distress due to back pain worse with movement. Eyes: Normal exam ENT   Head: Normocephalic and atraumatic.   Mouth/Throat: Mucous membranes are moist. Cardiovascular: Normal rate, regular rhythm.  Respiratory: Normal respiratory effort without tachypnea nor retractions. Breath sounds are clear Gastrointestinal: Soft and nontender. No distention.   Musculoskeletal: Patient able to move her extremities. Has several older appearing pressure ulcers to bilateral feet.No back tenderness to palpation. Neurologic:  Normal speech and language. Able to move her extremities, decreased movement/motor in lower extremities, which she states is chronic. Skin:  Skin is warm, dry a Psychiatric: Mood and affect are normal.    ____________________________________________   INITIAL IMPRESSION / ASSESSMENT AND PLAN / ED COURSE  Pertinent labs & imaging results that were available during my care of the patient were reviewed by me and considered in my medical decision making (see chart for details).  Patient presents to the emergency department for increased pain, difficulty ambulating, and increased tremor. Patient had a recent CT scan ordered by Dr. Sampson Goon showing: IMPRESSION: 1. Severe endplate destruction along the cyst inferior endplate of L2 and superior endplate of L3 with mild increased destruction compared to the prior exam of 08/09/2016 consistent with discitis-osteomyelitis with mild paraspinal phlegmonous changes. No drainable paraspinal abscess. Intraspinal soft tissues are not fully imaged on this examination due to poor soft tissue contrast  Essentially showing increased damage/distraction of L2/L3, but no identifiable abscess. Patient was supposed to be restarted on vancomycin after each dialysis session however she has not been dialyzed since Friday, so she has not restarted IV antibiotics. In the emergency department the patient does appear to be in quite a bit of pain especially with any attempted movement. We will check labs, work on pain control. As the patient has not been dialyzed since Friday, is in too much pain to move at home, daughter cannot adequately care for the patient at home, anticipated likely admission to the hospital for further treatment and IV antibiotics.  The patient's labs reflect the need for dialysis. Nephrology has seen the patient. Patient's back pain is much improved after pain medication. However given the patient's elevated BUNs and missing 2 dialysis sessions we'll admit to the hospital for further treatment and hemodialysis.   ____________________________________________   FINAL CLINICAL IMPRESSION(S) / ED DIAGNOSES  discitis Low back pain     Minna Antis, MD 11/11/16 1206

## 2016-11-11 NOTE — ED Notes (Signed)
Per Dr. Lenard LancePaduchowski start vanc before UA.

## 2016-11-12 LAB — GLUCOSE, CAPILLARY
Glucose-Capillary: 87 mg/dL (ref 65–99)
Glucose-Capillary: 99 mg/dL (ref 65–99)
Glucose-Capillary: 118 mg/dL — ABNORMAL HIGH (ref 65–99)
Glucose-Capillary: 104 mg/dL — ABNORMAL HIGH (ref 65–99)

## 2016-11-12 LAB — HEPATITIS B SURFACE ANTIBODY,QUALITATIVE: Hep B S Ab: NONREACTIVE

## 2016-11-12 LAB — SEDIMENTATION RATE: Sed Rate: 71 mm/hr — ABNORMAL HIGH (ref 0–30)

## 2016-11-12 LAB — HEPATITIS B SURFACE ANTIGEN: Hepatitis B Surface Ag: NEGATIVE

## 2016-11-12 LAB — PHOSPHORUS: Phosphorus: 3.9 mg/dL (ref 2.5–4.6)

## 2016-11-12 LAB — PROTIME-INR
INR: 2.55
PROTHROMBIN TIME: 27.9 s — AB (ref 11.4–15.2)

## 2016-11-12 LAB — HEPATITIS B CORE ANTIBODY, TOTAL: Hep B Core Total Ab: NEGATIVE

## 2016-11-12 LAB — MRSA PCR SCREENING: MRSA by PCR: NEGATIVE

## 2016-11-12 MED ORDER — OXYCODONE HCL 5 MG PO TABS
5.0000 mg | ORAL_TABLET | Freq: Four times a day (QID) | ORAL | Status: DC | PRN
  Administered 2016-11-12 – 2016-11-13 (×2): 10 mg via ORAL
  Administered 2016-11-13: 5 mg via ORAL
  Filled 2016-11-12 (×3): qty 2

## 2016-11-12 MED ORDER — FENTANYL CITRATE (PF) 100 MCG/2ML IJ SOLN: 12.5000 ug | INTRAMUSCULAR | Status: DC | PRN

## 2016-11-12 MED ORDER — HYDROMORPHONE HCL 1 MG/ML IJ SOLN
1.0000 mg | INTRAMUSCULAR | Status: DC | PRN
  Administered 2016-11-12: 1 mg via INTRAVENOUS
  Filled 2016-11-12: qty 1

## 2016-11-12 NOTE — Progress Notes (Signed)
ANTICOAGULATION CONSULT NOTE - Initial Consult  Pharmacy Consult for Warfarin dosing Indication: atrial fibrillation  Allergies  Allergen Reactions  . Codeine Itching and Other (See Comments)    Reaction:  Confusion/Hallucinations   . Contrast Media [Iodinated Diagnostic Agents] Other (See Comments)    "skin peel" breaks into a blistered rash, then peel. If given Benadryl and solumedrol she will be okay  . Oxycodone Other (See Comments)    Reaction:  Confusion/Hallucinations   . Quinine Derivatives Rash  . Tape Other (See Comments)    Skin is very thin and will blister and pull off skin with any tape except paper tape.  PLEASE USE PAPER TAPE.    Patient Measurements: Height: 5\' 5"  (165.1 cm) Weight: 156 lb 15.5 oz (71.2 kg) IBW/kg (Calculated) : 57  Vital Signs: Temp: 98.2 F (36.8 C) (06/14 0452) Temp Source: Oral (06/14 0452) BP: 115/61 (06/14 0452) Pulse Rate: 87 (06/14 0452)  Labs:  Recent Labs  11/11/16 0926 11/11/16 1333 11/12/16 0003  HGB 10.0*  --   --   HCT 30.3*  --   --   PLT 222  --   --   LABPROT  --  27.1* 27.9*  INR  --  2.46 2.55  CREATININE 8.60*  --   --     Estimated Creatinine Clearance: 5.1 mL/min (A) (by C-G formula based on SCr of 8.6 mg/dL (H)).   Medical History: Past Medical History:  Diagnosis Date  . Anemia   . Anxiety   . Arthritis    gout  . Atrial fibrillation (HCC)   . CHF (congestive heart failure) (HCC)    has had this off and on prior to dialysis but has had several times since  . Chronic kidney disease    end stage renal insufficiency.  on dialysis M,W,F  . Diabetes mellitus without complication (HCC)   . Dialysis patient [facility](HCC)    Mon.- Wed- Fri.  . Dysrhythmia    afib/flutter  . GERD (gastroesophageal reflux disease)   . Hypertension   . 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 Peripheral vascular disease (HCC)   . Pleural effusion   . Pulmonary hypertension (HCC)   . Renal insufficiency   .  Restless leg syndrome   . Shortness of breath dyspnea    with exertion  . Stroke (HCC) 06/2015   has had 1 other stroke. first one affected vision. the most recent one affected right arm   Assessment: 83 yo female with PMH of A.Fib, admitted with Discitis. Pharmacy consulted for warfarin dosing and monitoring. INR on admission 2.46.  Home Regimen: warfarin 2.5mg  QD  DATE  INR  DOSE 6/13  2.46  2.5mg  6/14  2.55  2.5 mg  Goal of Therapy:  INR 2-3 Monitor platelets by anticoagulation protocol: Yes   Plan:  Will continue patient's home dose of Warfarin 2.5mg  daily. Patient has been started on Vancomycin. Will monitor INR daily while on Abx per protocol.   Carola FrostNathan A Hillard Goodwine, PharmD, BCPS Clinical Pharmacist 11/12/2016 8:59 AM

## 2016-11-12 NOTE — Progress Notes (Signed)
[facility]  ROUNDING NOTE   Subjective:   Daughter at bedside.  Hemodialysis yesterday. Received 2 doses of vancomycin. Tolerated treatment well. UF 530mL.   Objective:  Vital signs in last 24 hours:  Temp:  [97.6 F (36.4 C)-98.2 F (36.8 C)] 98.2 F (36.8 C) (06/14 0452) Pulse Rate:  [63-96] 87 (06/14 0452) Resp:  [12-29] 18 (06/14 0452) BP: (76-139)/(47-87) 115/61 (06/14 0452) SpO2:  [86 %-100 %] 100 % (06/14 0452) Weight:  [71 kg (156 lb 8.4 oz)-71.2 kg (156 lb 15.5 oz)] 71.2 kg (156 lb 15.5 oz) (06/13 1812)  Weight change:  Filed Weights   11/11/16 0916 11/11/16 1510 11/11/16 1812  Weight: 74 kg (163 lb 2.3 oz) 71 kg (156 lb 8.4 oz) 71.2 kg (156 lb 15.5 oz)    Intake/Output: I/O last 3 completed shifts: In: 120 [P.O.:120] Out: -530    Intake/Output this shift:  No intake/output data recorded.  Physical Exam: General: NAD, laying in bed, frail  Head: Normocephalic, atraumatic. Moist oral mucosal membranes  Eyes: Anicteric, PERRL  Neck: Supple, trachea midline  Lungs:  Clear to auscultation  Heart: Regular rate and rhythm  Abdomen:  Soft, nontender,   Extremities: no peripheral edema.  Neurologic: Nonfocal, moving all four extremities  Skin: No lesions  Access: Right AVF    Basic Metabolic Panel:  Recent Labs Lab 11/11/16 0926 11/12/16 0003  NA 133*  --   K 4.5  --   CL 93*  --   CO2 20*  --   GLUCOSE 73  --   BUN 87*  --   CREATININE 8.60*  --   CALCIUM 7.8*  --   PHOS 8.1* 3.9    Liver Function Tests:  Recent Labs Lab 11/11/16 0926  AST 23  ALT 8*  ALKPHOS 58  BILITOT 0.8  PROT 6.8  ALBUMIN 2.9*   No results for input(s): LIPASE, AMYLASE in the last 168 hours. No results for input(s): AMMONIA in the last 168 hours.  CBC:  Recent Labs Lab 11/11/16 0926  WBC 8.4  HGB 10.0*  HCT 30.3*  MCV 92.9  PLT 222    Cardiac Enzymes: No results for input(s): CKTOTAL, CKMB, CKMBINDEX, TROPONINI in the last 168  hours.  BNP: Invalid input(s): POCBNP  CBG:  Recent Labs Lab 11/11/16 1339 11/11/16 1902 11/11/16 2039 11/12/16 0745  GLUCAP 91 83 97 87    Microbiology: Results for orders placed or performed during the hospital encounter of 11/11/16  Blood culture (routine x 2)     Status: None (Preliminary result)   Collection Time: 11/11/16  9:45 AM  Result Value Ref Range Status   Specimen Description BLOOD L AC  Final   Special Requests   Final    BOTTLES DRAWN AEROBIC AND ANAEROBIC Blood Culture adequate volume   Culture NO GROWTH < 24 HOURS  Final   Report Status PENDING  Incomplete  Blood culture (routine x 2)     Status: None (Preliminary result)   Collection Time: 11/11/16 10:00 AM  Result Value Ref Range Status   Specimen Description BLOOD L FA  Final   Special Requests   Final    BOTTLES DRAWN AEROBIC AND ANAEROBIC Blood Culture adequate volume   Culture NO GROWTH < 24 HOURS  Final   Report Status PENDING  Incomplete  MRSA PCR Screening     Status: None   Collection Time: 11/12/16 12:49 AM  Result Value Ref Range Status   MRSA by PCR NEGATIVE  NEGATIVE Final    Comment:        The GeneXpert MRSA Assay (FDA approved for NASAL specimens only), is one component of a comprehensive MRSA colonization surveillance program. It is not intended to diagnose MRSA infection nor to guide or monitor treatment for MRSA infections.     Coagulation Studies:  Recent Labs  11/11/16 1333 11/12/16 0003  LABPROT 27.1* 27.9*  INR 2.46 2.55    Urinalysis: No results for input(s): COLORURINE, LABSPEC, PHURINE, GLUCOSEU, HGBUR, BILIRUBINUR, KETONESUR, PROTEINUR, UROBILINOGEN, NITRITE, LEUKOCYTESUR in the last 72 hours.  Invalid input(s): APPERANCEUR    Imaging: No results found.   Medications:   . vancomycin Stopped (11/11/16 1813)   . calcium acetate  1,334 mg Oral TID WC  . diltiazem  120 mg Oral QHS  . epoetin (EPOGEN/PROCRIT) injection  10,000 Units Intravenous Q  M,W,F-HD  . famotidine  10 mg Oral Daily  . fentaNYL  25 mcg Transdermal Q72H  . insulin aspart  0-9 Units Subcutaneous TID WC  . multivitamin  1 tablet Oral Daily  . omega-3 acid ethyl esters  1,000 mg Oral BID  . PARoxetine  40 mg Oral QHS  . pravastatin  20 mg Oral QPM  . warfarin  2.5 mg Oral q1800  . Warfarin - Pharmacist Dosing Inpatient   Does not apply q1800   acetaminophen **OR** acetaminophen, albuterol, docusate sodium, HYDROmorphone (DILAUDID) injection, ondansetron **OR** ondansetron (ZOFRAN) IV, polyethylene glycol, traMADol  Assessment/ Plan:  [name] is a 83 y.o. white female with atrial fibrillation, hypertension, gout, insulin-dependent diabetes mellitus type 2, diabetic neuropathy, CVA with right visual field blindness, hyperlipidemia, major depressive disorder, overactive bladder with incontinence, generalized anxiety disorder, congestive heart failure diastolic, parathyroidectomy, and osteoarthritis. Currently with discitis  MWF [facility] [address]   1. End Stage Renal Disease: MWF. Last treatment yesterday. Tolerated treatment well. UF of .  - Continue MWF schedule  2. Discitis:  - Appreciate ID input - IV vancomycin  - Consult neurosurgery  3. Hypertension:   - diltiazem  4. Secondary Hyperparathyroidism:   - calcium acetate   5. Anemia of chronic kidney disease: hemoglobin 10 - epo 4000 with HD treatment.    LOS: 1 Leo Weyandt 6/14/201811:26 AM

## 2016-11-12 NOTE — Progress Notes (Signed)
CH responded to a PG for AD education. Daughter of the Pt needed clarification on the HCPOA. Her document was prepared by a lawyer and gave a comprehensive POA which included Health Care. CH informed RN that the HCPOA is covered. A copy of the document will be reinserted to Pt chart.     11/12/16 1300  Clinical Encounter Type  Visited With Patient;Patient and family together;Other (Comment)  Visit Type Initial;Spiritual support  Referral From Nurse  Consult/Referral To Chaplain  Spiritual Encounters  Spiritual Needs Literature

## 2016-11-12 NOTE — Evaluation (Signed)
Physical Therapy Evaluation Patient Details Name: [name] MRN: [id number] DOB: [birth year] Today's Date: 11/12/2016   History of Present Illness  83 yo female with, has discitis L2-3 in lumbar spine, bronchitis vs PNA per imaging report.  PMHx:  CHF, COPD, a-fib, neuropathy, R heel ulcer with varied states of healing over the past few months.    Clinical Impression  Pt is very limited functionally secondary to low back pain and general weakness.  She was able to sit at EOB w/o direct assistance for 2 bouts ~30 seconds once initial unsteadiness was controlled and pt assisted to better EOB position.  She had shaking/tremors most of the time sitting up, she reports this has been typical for her for months now.  Pt states she was able to do some very limited standing and a few feet of walking (in parallel bars?) at rehab but has no actually walked for many months or even over a year.  Discussed with pt about viability of continuing to be safe at home and how much help family is able to give.  Pt may be appropriate for having aides in the home or possibly transitioning to SNF long term.  Unsure about best/most appropriate d/c disposition given her baseline and her long term inability to ambulate.  She may benefit from another bout of rehab, but unsure as to the likelihood of gaining much in the way of function/independence. Pt likely does need some increased assistance if she is to go home safely.    Follow Up Recommendations Home health PT;SNF (per progress, recent STR did not yield functional changes)    Equipment Recommendations       Recommendations for Other Services       Precautions / Restrictions Precautions Precautions: Fall Restrictions Weight Bearing Restrictions: No      Mobility  Bed Mobility Overal bed mobility: Needs Assistance Bed Mobility: Supine to Sit;Sit to Supine     Supine to sit: Mod assist Sit to supine: Mod assist   General bed mobility comments: Pt very  limited with how much she is able to tolerate. Initially she needed heavy assist to keep from falling/leaning back onto bed but with some encouragement and assist for positioning she was able to do 2 bouts of ~30 seconds sitting w/o direct phyiscal assist  Transfers                 General transfer comment: unable to try secondary to severe pain in sitting  Ambulation/Gait                Stairs            Wheelchair Mobility    Modified Rankin (Stroke Patients Only)       Balance Overall balance assessment: Needs assistance   Sitting balance-Leahy Scale: Poor Sitting balance - Comments: Pt very pain limited with sitting, was able to sit with only CGA briefly but with great effort                                     Pertinent Vitals/Pain Pain Assessment: 0-10 Pain Score: 9  Pain Location: mid/low back - 9/10 pain at rest, increased to "untolerable" with any movement    Home Living Family/patient expects to be discharged to:: Private residence Living Arrangements: Children Available Help at Discharge: Available 24 hours/day Type of Home: House Home Access: Ramped entrance     Home  Layout: One level Home Equipment: Walker - 2 wheels;Hospital bed;Wheelchair - IT trainermanual;Electric scooter      Prior Function Level of Independence: Needs assistance   Gait / Transfers Assistance Needed: Pt states she was able to take a few steps while at rehab, but essentially has only able to do assisted transfers only for about a year           Hand Dominance        Extremity/Trunk Assessment   Upper Extremity Assessment Upper Extremity Assessment: Generalized weakness;Defer to OT evaluation (limited, R shoulder very arthritic)    Lower Extremity Assessment Lower Extremity Assessment: Generalized weakness (very pain limited, able to do some limited AROM but hesitant)       Communication   Communication: No difficulties  Cognition  Arousal/Alertness: Awake/alert Behavior During Therapy: Anxious Overall Cognitive Status: Within Functional Limits for tasks assessed                                        General Comments      Exercises General Exercises - Lower Extremity Ankle Circles/Pumps: AROM;5 reps Heel Slides: Strengthening;5 reps;AROM Hip ABduction/ADduction: Strengthening;5 reps;AROM   Assessment/Plan    PT Assessment Patient needs continued PT services  PT Problem List Decreased strength;Decreased range of motion;Decreased activity tolerance;Decreased balance;Decreased mobility;Decreased knowledge of use of DME;Decreased safety awareness;Pain       PT Treatment Interventions Gait training;DME instruction;Stair training;Functional mobility training;Therapeutic activities;Therapeutic exercise;Balance training;Cognitive remediation;Patient/family education    PT Goals (Current goals can be found in the Care Plan section)  Acute Rehab PT Goals Patient Stated Goal: try to go back home safely PT Goal Formulation: With patient Time For Goal Achievement: 11/26/16 Potential to Achieve Goals: Poor    Frequency Min 2X/week   Barriers to discharge   Pt and family have been having a hard time caring for her at home    Co-evaluation               AM-PAC PT "6 Clicks" Daily Activity  Outcome Measure Difficulty turning over in bed (including adjusting bedclothes, sheets and blankets)?: Total Difficulty moving from lying on back to sitting on the side of the bed? : Total Difficulty sitting down on and standing up from a chair with arms (e.g., wheelchair, bedside commode, etc,.)?: Total Help needed moving to and from a bed to chair (including a wheelchair)?: Total Help needed walking in hospital room?: Total Help needed climbing 3-5 steps with a railing? : Total 6 Click Score: 6    End of Session   Activity Tolerance: Patient limited by pain Patient left: with call bell/phone within  reach;with bed alarm set Nurse Communication: Mobility status PT Visit Diagnosis: Muscle weakness (generalized) (M62.81);Pain;Difficulty in walking, not elsewhere classified (R26.2) Pain - Right/Left:  (bilateral) Pain - part of body:  (low back )    Time: [id number] PT Time Calculation (min) (ACUTE ONLY): 36 min   Charges:   PT Evaluation $PT Eval Low Complexity: 1 Procedure PT Treatments $Therapeutic Exercise: 8-22 mins   PT G Codes:        Malachi ProGalen R Juvia Aerts, DPT 11/12/2016, 11:47 AM

## 2016-11-12 NOTE — Clinical Social Work Note (Signed)
Clinical Social Work Assessment  Patient Details  Name: [name] MRN: [id number] Date of Birth: 1934-02-04  Date of referral:  11/12/16               Reason for consult:  Facility Placement                Permission sought to share information with:  Facility Sport and exercise psychologist, Family Supports Permission granted to share information::  Yes, Verbal Permission Granted  Name::        Agency::     Relationship::     Contact Information:     Housing/Transportation Living arrangements for the past 2 months:  [place], [place] of Information:  Patient, Adult Children Patient Interpreter Needed:  None Criminal Activity/Legal Involvement Pertinent to Current Situation/Hospitalization:  No - Comment as needed Significant Relationships:  Adult Children Lives with:  Adult Children Do you feel safe going back to the place where you live?  Yes Need for family participation in patient care:  Yes (Comment)  Care giving concerns:  Patient resides with her daughter and son in law.   Social Worker assessment / plan:  Patient has not been able to ambulate for several years but at baseline is able to stand and pivot. Due to uncontrolled pain, patient is not able to currently sit up. CSW was consulted for potential placement (rehab or long term care). CSW met with patient and daughter and son in law this afternoon and explained role and purpose of visit. Patient gave permission for her daughter and son in law to remain in the room. Patient lives with her daughter and son in law and the daughter is able to manage patient in the home as long as she can stand and pivot. Patient's daughter stated that when she went to rehab last, she stayed for 6 days. This would mean that patient has 40 more days of her medicare left and that she is in her copay days. Patient does not wish to [facility]. She is willing to consider another facility and is agreeable for a  new bed search. Patient is also a dialysis patient and will need to be able to sit up for outpatient dialysis.    Employment status:  Retired Forensic scientist:  [employer] PT Recommendations:  [place], Home with [place] / Referral to community resources:     Patient/Family's Response to care:  Patient and daughter expressed appreciation for CSW assistance.   Patient/Family's Understanding of and Emotional Response to Diagnosis, Current Treatment, and Prognosis:  Patient realizes her limitations and is now agreeable to consider rehab.  Emotional Assessment Appearance:  Appears younger than stated age Attitude/Demeanor/Rapport:  Aggressive (Verbally and/or physically) (pleasant and cooperative) Affect (typically observed):  Accepting, Adaptable, Calm, Pleasant Orientation:  Oriented to Self, Oriented to Place, Oriented to  Time, Oriented to Situation Alcohol / Substance use:  Not Applicable Psych involvement (Current and /or in the community):  No (Comment)  Discharge Needs  Concerns to be addressed:  Care Coordination Readmission within the last 30 days:  No Current discharge risk:  None Barriers to Discharge:  No Barriers Identified   Shela Leff, LCSW 11/12/2016, 3:40 PM

## 2016-11-12 NOTE — Evaluation (Signed)
Occupational Therapy Evaluation Patient Details Name: [name] MRN: [id number] DOB: 03-07-1934 Today's Date: 11/12/2016    History of Present Illness 83 yo female with, has discitis L2-3 in lumbar spine, bronchitis vs PNA per imaging report.  PMHx:  CHF, COPD, a-fib, neuropathy, R heel ulcer with varied states of healing over the past few months.     Clinical Impression   Pt seen for OT evaluation this date. Significantly limited by severe pain with movement. Participated briefly with rolling side to side in bed with min assist but unable to tolerate. Per chart review, pt wanting to return home with family assistance. Pt stated to OT, "I've accepted the fact that I need to go somewhere." Family supportive of this decision. Pt and family educated in considerations for various levels of care, rehabilitation, DME to assist with functional transfers. Up until about 3 weeks ago, had been able to participate well in stand pivot transfers with family, allowing her to perform toileting in bathroom, and negotiate her home environment fairly well from her transport chair. Pt can benefit from skilled OT services to address noted impairments in pain, dec strength and activity tolerance in order to maximize return to PLOF and minimize future falls risk.     Follow Up Recommendations  SNF    Equipment Recommendations  None recommended by OT    Recommendations for Other Services       Precautions / Restrictions Precautions Precautions: Fall Restrictions Weight Bearing Restrictions: No      Mobility Bed Mobility Overal bed mobility: Needs Assistance Bed Mobility: Rolling Rolling: Min assist   Supine to sit: Mod assist Sit to supine: Mod assist   General bed mobility comments: pt very limited by pain, able to perform rolling side to side very briefly with min assist and 10/10 pain   Transfers                 General transfer comment: unable to try secondary to severe pain     Balance Overall balance assessment: Needs assistance   Sitting balance-Leahy Scale: Poor Sitting balance - Comments: pt unable to participate in seated activity due to pain                                   ADL either performed or assessed with clinical judgement   ADL Overall ADL's : Needs assistance/impaired Eating/Feeding: Sitting;Set up   Grooming: Sitting;Set up;Brushing hair;Minimal assistance Grooming Details (indicate cue type and reason): min assist to comb back of hair due to limited ROM of RUE (dominant hand), pain limiting factor Upper Body Bathing: Moderate assistance;Bed level   Lower Body Bathing: Maximal assistance;Bed level   Upper Body Dressing : Minimal assistance;Bed level Upper Body Dressing Details (indicate cue type and reason): was able to perform with set up and using LUE for one handed technique, but now requires more assist Lower Body Dressing: Maximal assistance;Bed level     Toilet Transfer Details (indicate cue type and reason): deferred due to severe pain           General ADL Comments: pt generally max assist for LB ADL from bed level     Vision Baseline Vision/History: No visual deficits Patient Visual Report: No change from baseline       Perception     Praxis      Pertinent Vitals/Pain Pain Assessment: 0-10 Pain Score: 9  Pain Location: mid/low back -  9/10 pain at rest, increased to "untolerable" with any movement Pain Intervention(s): Limited activity within patient's tolerance;Monitored during session;Repositioned     Hand Dominance Right   Extremity/Trunk Assessment Upper Extremity Assessment Upper Extremity Assessment: Generalized weakness (limited AROM of R shoulder flexion due to significant arthritis)   Lower Extremity Assessment Lower Extremity Assessment: Defer to PT evaluation;Generalized weakness (able to lift BLE off bed for approx 5 seconds, limited by severe pain)       Communication  Communication Communication: No difficulties   Cognition Arousal/Alertness: Awake/alert Behavior During Therapy: Anxious Overall Cognitive Status: Within Functional Limits for tasks assessed                                     General Comments       Exercises Other Exercises Other Exercises: pt/family educated in cognitive behavioral pain coping strategies including progressive relaxaion, distraction, pleasant imagery to help address severe pain; verbalized understanding Other Exercises: pt/family educated in role of OT, considerations for functional independence/rehab options/quality of life when considering discharge disposition   Shoulder Instructions      Home Living Family/patient expects to be discharged to:: Private residence Living Arrangements: Children Available Help at Discharge: Available 24 hours/day Type of Home: House Home Access: Ramped entrance     Home Layout: One level     Bathroom Shower/Tub: Chief Strategy Officer: Handicapped height (plus toilet riser)     Home Equipment: Environmental consultant - 2 wheels;Hospital bed;Wheelchair - IT trainer;Adaptive equipment;Other (comment);Bedside commode;Transport chair Adaptive Equipment: Reacher Additional Comments: lift chair, handicap accessible van      Prior Functioning/Environment Level of Independence: Needs assistance  Gait / Transfers Assistance Needed: Pt states she was able to take a few steps while at rehab, but essentially has only able to do assisted stand pivot transfers only for about a year with min/mod assist to/from seating surfaces; utilizes transport chair for primary mobility ADL's / Homemaking Assistance Needed: pt has provided extensive assist for ADL (with pt able to perform stand pivot transfers to help) with increasing assist required the last 3 weeks or so since finishing IV antibiotics   Comments: multiple falls noted in past 12 months, more recently         OT Problem List: Pain;Decreased strength;Decreased activity tolerance;Impaired balance (sitting and/or standing)      OT Treatment/Interventions: Self-care/ADL training;Therapeutic exercise;Energy conservation;Patient/family education;DME and/or AE instruction;Therapeutic activities    OT Goals(Current goals can be found in the care plan section) Acute Rehab OT Goals Patient Stated Goal: have less pain OT Goal Formulation: With patient/family Time For Goal Achievement: 11/26/16 Potential to Achieve Goals: Good  OT Frequency: Min 1X/week   Barriers to D/C:    family likely unable to provide current level of care for pt       Co-evaluation              AM-PAC PT "6 Clicks" Daily Activity     Outcome Measure Help from another person eating meals?: None Help from another person taking care of personal grooming?: A Little Help from another person toileting, which includes using toliet, bedpan, or urinal?: A Lot Help from another person bathing (including washing, rinsing, drying)?: A Lot Help from another person to put on and taking off regular upper body clothing?: A Lot Help from another person to put on and taking off regular lower body clothing?: A Lot 6 Click Score: 15  End of Session    Activity Tolerance: Patient limited by pain (significantly limited by pain) Patient left: in bed;with call bell/phone within reach;with bed alarm set;with family/visitor present;with nursing/sitter in room  OT Visit Diagnosis: Pain;History of falling (Z91.81);Muscle weakness (generalized) (M62.81) Pain - Right/Left:  (back)                Time: [id number] OT Time Calculation (min): 44 min Charges:  OT General Charges $OT Visit: 1 Procedure OT Evaluation $OT Eval Moderate Complexity: 1 Procedure OT Treatments $Self Care/Home Management : 8-22 mins G-Codes:     Richrd PrimeJamie Stiller, MPH, MS, OTR/L ascom [phone] 11/12/16, 3:06 PM

## 2016-11-12 NOTE — Consult Note (Signed)
Referring Physician:  No referring provider defined for this encounter.  Primary Physician:  [name], MD  Chief Complaint:  Back pain  History of Present Illness: [name] is a 83 y.o. female who presents with the chief complaint of back pain. 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 She has been treated with vancomycin, but was recently discontinued and has worsened.  She re-presented with back pain.  She has decreased sensation of BLE due to neuropathy. She describes some pain in her legs with pain worse in the left buttock than the right. She has some pain in the anterior thighs and posterior calves.  She has some pain into her ankles.  She can't give specific information about pain in her feet.  Her back pain improves with laying down, but is worse with moving.  She has chronic bowel and bladder incontinence.  Of note, she has underlying ESRD on HD due to T2DM. She has not walked in 3 years due to instability.   The symptoms are causing a significant impact on the patient's life.   Review of Systems:  A 10 point review of systems is negative, except for the pertinent positives and negatives detailed in the HPI.  Past Medical History: Past Medical History:  Diagnosis Date  . Anemia   . Anxiety   . Arthritis    gout  . Atrial fibrillation (HCC)   . CHF (congestive heart failure) (HCC)    has had this off and on prior to dialysis but has had several times since  . Chronic kidney disease    end stage renal insufficiency.  on dialysis M,W,F  . Diabetes mellitus without complication (HCC)   . Dialysis patient [facility])    Mon.- Wed- Fri.  . Dysrhythmia    afib/flutter  . GERD (gastroesophageal reflux disease)   . Hypertension   . 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 Peripheral vascular disease (HCC)   . Pleural effusion   . Pulmonary hypertension  (HCC)   . Renal insufficiency   . Restless leg syndrome   . Shortness of breath dyspnea    with exertion  . Stroke (HCC) 06/2015   has had 1 other stroke. first one affected vision. the most recent one affected right arm    Past Surgical History: Past Surgical History:  Procedure Laterality Date  . AMPUTATION Right 11/07/2015   Procedure: AMPUTATION DIGIT ( 4TH TOE, RIGHT FOOT );  Surgeon: Annice Needy, MD;  Location: [facility] ORS;  Service: Vascular;  Laterality: Right;  . AMPUTATION TOE Left 06/07/2015   Procedure: AMPUTATION TOE;  Surgeon: Gwyneth Revels, DPM;  Location: [facility] ORS;  Service: Podiatry;  Laterality: Left;  . CARDIAC CATHETERIZATION    . CATARACT EXTRACTION, BILATERAL Bilateral   . CHOLECYSTECTOMY    . DILATION AND CURETTAGE OF UTERUS    . ENDARTERECTOMY Left 07/26/2015   Procedure: ENDARTERECTOMY CAROTID;  Surgeon: Renford Dills, MD;  Location: [facility] ORS;  Service: Vascular;  Laterality: Left;  . EYE SURGERY    . FISTULAGRAM ([facility] HX)    . HEEL DECUBITUS ULCER EXCISION Right 05/13/2016  . INCISION AND DRAINAGE ABSCESS Right 02/16/2016   Procedure: INCISION AND DRAINAGE ABSCESS and application wound vac;  Surgeon: Bertram Denver, MD;  Location: [facility] ORS;  Service: Urology;  Laterality: Right;  . IR GENERIC HISTORICAL  08/14/2016   IR  FLUORO GUIDED NEEDLE PLC ASPIRATION/INJECTION LOC 08/14/2016 [facility]-INTERV RAD  . JOINT REPLACEMENT     bilateral hip replacement  . LOWER EXTREMITY ANGIOGRAPHY Right 10/29/2016   Procedure: Lower Extremity Angiography;  Surgeon: Annice Needy, MD;  Location: [facility] INVASIVE CV LAB;  Service: Cardiovascular;  Laterality: Right;  . LOWER EXTREMITY INTERVENTION  10/29/2016   Procedure: Lower Extremity Intervention;  Surgeon: Annice Needy, MD;  Location: [facility] INVASIVE CV LAB;  Service: Cardiovascular;;  . MEDTRONIC BLADDER INTERSTEM     currently turned off for MRI in January  . PARATHYROIDECTOMY    . PERIPHERAL VASCULAR CATHETERIZATION N/A 10/08/2014    Procedure: A/V Shuntogram/Fistulagram;  Surgeon: Annice Needy, MD;  Location: [facility] INV[facility] LAB;  Service: Cardiovascular;  Laterality: N/A;  . PERIPHERAL VASCULAR CATHETERIZATION N/A 03/04/2015   Procedure: A/V Shuntogram/Fistulagram;  Surgeon: Annice Needy, MD;  Location: [facility] LAB;  Service: Cardiovascular;  Laterality: N/A;  . PERIPHERAL VASCULAR CATHETERIZATION N/A 03/04/2015   Procedure: A/V Shunt Intervention;  Surgeon: Annice Needy, MD;  Location: [facility] LAB;  Service: Cardiovascular;  Laterality: N/A;  . PERIPHERAL VASCULAR CATHETERIZATION N/A 09/16/2015   Procedure: Lower Extremity Angiography;  Surgeon: Annice Needy, MD;  Location: [facility];  Service: Cardiovascular;  Laterality: N/A;  . PERIPHERAL VASCULAR CATHETERIZATION  09/16/2015   Procedure: Lower Extremity Intervention;  Surgeon: Annice Needy, MD;  Location: [facility];  Service: Cardiovascular;;  . PERIPHERAL VASCULAR CATHETERIZATION Right 04/27/2016   Procedure: A/V Shuntogram/Fistulagram;  Surgeon: Annice Needy, MD;  Location: [facility];  Service: Cardiovascular;  Laterality: Right;  . PERIPHERAL VASCULAR CATHETERIZATION Left 05/14/2016   Procedure: DIALYSIS/PERMA CATHETER INSERTION;  Surgeon: Annice Needy, MD;  Location: [facility];  Service: Vascular;  Laterality: Left;  . PERIPHERAL VASCULAR CATHETERIZATION N/A 06/08/2016   Procedure: Dialysis/Perma Catheter Removal;  Surgeon: Annice Needy, MD;  Location: [facility];  Service: Cardiovascular;  Laterality: N/A;  . REVISON OF ARTERIOVENOUS FISTULA Right 05/14/2016   Procedure: REVISON OF ARTERIOVENOUS FISTULA ( WITH ARTEGRAFT );  Surgeon: Annice Needy, MD;  Location: [facility] OR[facility];  Service: Vascular;  Laterality: Right;  . THYROIDECTOMY, PARTIAL    . TONSILLECTOMY    . WOUND DEBRIDEMENT Right 11/07/2015   Procedure: DEBRIDEMENT WOUND ( RIGHT HEEL AND ABDOMINAL WOUND (2) );  Surgeon: Annice Needy, MD;  Location: [facility];  Service:  Vascular;  Laterality: Right;  . WOUND DEBRIDEMENT N/A 11/28/2015   Procedure: DEBRIDEMENT WOUND ( ABDOMINAL );  Surgeon: Annice Needy, MD;  Location: [facility];  Service: Vascular;  Laterality: N/A;    Allergies: Allergies as of 11/11/2016 - Review Complete 11/11/2016  Allergen Reaction Noted  . Codeine Itching and Other (See Comments) 10/08/2014  . Contrast media [iodinated diagnostic agents] Other (See Comments) 07/19/2015  . Oxycodone Other (See Comments) 12/11/2014  . Quinine derivatives Rash 12/11/2014  . Tape Other (See Comments) 05/14/2016    Medications:  Current Facility-Administered Medications:  .  acetaminophen (TYLENOL) tablet 650 mg, 650 mg, Oral, Q6H PRN **OR** acetaminophen (TYLENOL) suppository 650 mg, 650 mg, Rectal, Q6H PRN, Enedina Finner, MD .  albuterol (PROVENTIL) (2.5 MG/3ML) 0.083% nebulizer solution 2.5 mg, 2.5 mg, Nebulization, Q6H PRN, Enedina Finner, MD .  calcium acetate (PHOSLO) capsule 1,334 mg, 1,334 mg, Oral, TID WC, Enedina Finner, MD, 1,334 mg at 11/12/16 1301 .  diltiazem (CARDIZEM CD) 24 hr capsule 120 mg, 120 mg, Oral, QHS, Patel, Sona,  MD, 120 mg at 11/11/16 2156 .  docusate sodium (COLACE) capsule 200 mg, 200 mg, Oral, BID PRN, Enedina FinnerPatel, Sona, MD .  epoetin alfa (EPOGEN,PROCRIT) injection 10,000 Units, 10,000 Units, Intravenous, Q M,W,F-HD, Kolluru, Sarath, MD, 10,000 Units at 11/11/16 1713 .  famotidine (PEPCID) tablet 10 mg, 10 mg, Oral, Daily, Enedina FinnerPatel, Sona, MD, 10 mg at 11/12/16 1119 .  fentaNYL (DURAGESIC - dosed mcg/hr) patch 25 mcg, 25 mcg, Transdermal, Q72H, Enedina FinnerPatel, Sona, MD, 25 mcg at 11/11/16 2202 .  fentaNYL (SUBLIMAZE) injection 12.5 mcg, 12.5 mcg, Intravenous, Q3H PRN, Sherryll BurgerShah, Vipul, MD .  insulin aspart (novoLOG) injection 0-9 Units, 0-9 Units, Subcutaneous, TID WC, Enedina FinnerPatel, Sona, MD .  multivitamin (RENA-VIT) tablet 1 tablet, 1 tablet, Oral, Daily, Enedina FinnerPatel, Sona, MD .  omega-3 acid ethyl esters (LOVAZA) capsule 1,000 mg, 1,000 mg, Oral, BID, Enedina FinnerPatel, Sona,  MD, 1,000 mg at 11/11/16 2156 .  ondansetron (ZOFRAN) tablet 4 mg, 4 mg, Oral, Q6H PRN **OR** ondansetron (ZOFRAN) injection 4 mg, 4 mg, Intravenous, Q6H PRN, Enedina FinnerPatel, Sona, MD .  oxyCODONE (Oxy IR/ROXICODONE) immediate release tablet 5-10 mg, 5-10 mg, Oral, Q6H PRN, Sherryll BurgerShah, Vipul, MD .  PARoxetine (PAXIL) tablet 40 mg, 40 mg, Oral, QHS, Enedina FinnerPatel, Sona, MD, 40 mg at 11/11/16 2156 .  polyethylene glycol (MIRALAX / GLYCOLAX) packet 17 g, 17 g, Oral, Daily PRN, Enedina FinnerPatel, Sona, MD .  pravastatin (PRAVACHOL) tablet 20 mg, 20 mg, Oral, QPM, Enedina FinnerPatel, Sona, MD, 20 mg at 11/11/16 2156 .  traMADol (ULTRAM) tablet 50 mg, 50 mg, Oral, Q6H PRN, Enedina FinnerPatel, Sona, MD, 50 mg at 11/12/16 0459 .  vancomycin (VANCOCIN) IVPB 750 mg/150 ml premix, 750 mg, Intravenous, Q M,W,F-HD, Hallaji, Sheema M, RPH, Stopped at 11/11/16 1813 .  warfarin (COUMADIN) tablet 2.5 mg, 2.5 mg, Oral, q1800, Enedina FinnerPatel, Sona, MD, 2.5 mg at 11/11/16 2158 .  Warfarin - Pharmacist Dosing Inpatient, , Does not apply, q1800, Enedina FinnerPatel, Sona, MD   Social History: Social History  Substance Use Topics  . Smoking status: Never Smoker  . Smokeless tobacco: Never Used  . Alcohol use No    Family Medical History: Family History  Problem Relation Age of Onset  . Colon cancer Mother   . Stroke Father   . Diabetes Brother     Physical Examination: Vitals:   11/12/16 0452 11/12/16 1300  BP: 115/61 115/60  Pulse: 87 95  Resp: 18 18  Temp: 98.2 F (36.8 C) 98 F (36.7 C)     General: Patient is well developed, well nourished, calm, collected, and in no apparent distress.  Psychiatric: Patient is non-anxious.  Head:  Pupils equal, round, and reactive to light.  ENT:  Oral mucosa appears well hydrated.  Neck:   Supple.  Full range of motion.  Respiratory: Patient is breathing without any difficulty.  Extremities: No edema.  Vascular: Palpable pulses in dorsal pedal vessels.  Skin:   On exposed skin, there are minor skin lesions on her legs and  darkening of the skin of her anterior calves.  NEUROLOGICAL:  General: In no acute distress.   Awake, alert, oriented to person, place, and time.  Pupils equal round and reactive to light.  Facial tone is symmetric.  Tongue protrusion is midline.  There is no pronator drift.  ROM of spine: untested.   Strength: Side Biceps Triceps Deltoid Interossei Grip Wrist Ext. Wrist Flex.  R 5 5 5 5 5 5 5   L 5 5 5 5 5 5 5    Side Iliopsoas Quads Hamstring PF  DF EHL  R 5 5 5 5 5 5   L 5 5 5 5 5 5    Reflexes are 1+ and symmetric at the biceps, triceps, brachioradialis, patella and achilles.   Bilateral upper and lower extremity sensation is intact to light touch.  4 beats of clonus is present on the left.  .  Gait is untested-  Patient is immobile.  Hoffman's is absent.  Imaging: IMPRESSION: 1. Severe endplate destruction along the cyst inferior endplate of L2 and superior endplate of L3 with mild increased destruction compared to the prior exam of 08/09/2016 consistent with discitis-osteomyelitis with mild paraspinal phlegmonous changes. No drainable paraspinal abscess. Intraspinal soft tissues are not fully imaged on this examination due to poor soft tissue contrast   Electronically Signed   By: Elige Ko   On: 11/05/2016 14:27 I have personally reviewed the images and agree with the above interpretation.  Assessment and Plan: [name] is a pleasant 83 y.o. 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  This is a very difficult situation.  To clean out her infection, a very morbid procedure would be required including 2 level lumbar corpectomy with posterior spinal instrumentation (2 stage surgery).  I do not think this is reasonable given her underlying medical condition. She may ultimately require lifetime suppressive antibiotics.  To help with her pain, I would consider talking to a pain management  specialist to see whether injections in the facet joints might be worthwhile.  I suspect they would not help.    Additionally, I would recommend fitting her for a lumbar spinal orthotic (LSO) from [employer].  This might significantly improve pain from biomechanical instability.  Finally, I would consider involvement of palliative care.  She is unlikely to have her symptoms resolve even after the infection is cleared given the amount of destruction of her vertebral bodies.   I am happy to help with her care in the future, though I do not feel that surgical intervention is indicated.  I can communicate with Dr. Sampson Goon as he sees her as an outpatient.    Nevyn Bossman K. Myer Haff MD, MPHS Dept. of Neurosurgery

## 2016-11-12 NOTE — NC FL2 (Signed)
[place] MEDICAID FL2 LEVEL OF CARE SCREENING TOOL     IDENTIFICATION  Patient Name: [name] Birthdate: Nov 29, 1933 Sex: female Admission Date (Current Location): 11/11/2016  [facility] [place]   Facility and Address:  [facility], [address], [place], [place] [address]      Provider Number: [id number]  Attending Physician Name and Address:  Delfino LovettShah, Vipul, MD  Relative Name and Phone Number:       Current Level of Care: Hospital Recommended Level of Care: Skilled Nursing Facility Prior Approval Number:    Date Approved/Denied:   PASRR Number:    Discharge Plan: SNF    Current Diagnoses: Patient Active Problem List   Diagnosis Date Noted  . Lower back pain 08/17/2016  . Hyponatremia 08/17/2016  . Hyperkalemia 08/17/2016  . Heel ulcer (HCC) 08/10/2016  . Discitis 08/09/2016  . Atherosclerosis of native arteries of the extremities with ulceration (HCC) 04/14/2016  . Kidney dialysis as the cause of abnormal reaction of the patient, or of later complication, without mention of misadventure at the time of the procedure (CODE) 03/03/2016  . Pressure injury of skin 02/18/2016  . Osteomyelitis (HCC) 02/14/2016  . Hematoma 10/12/2015  . Leg pain, right 09/14/2015  . PVD (peripheral vascular disease) with claudication (HCC) 09/14/2015  . Pneumonia 08/13/2015  . Carotid stenosis 07/26/2015  . Amputation of fifth toe, left, traumatic (HCC) 06/24/2015  . Diabetes mellitus (HCC) 06/23/2015  . Diabetic neuropathy (HCC) 06/23/2015  . Chronic atrial fibrillation (HCC) 06/23/2015  . Left carotid artery stenosis 06/23/2015  . Cerebral infarction (HCC) 06/21/2015  . Pressure ulcer 06/19/2015  . End stage renal disease on dialysis (HCC) 06/18/2015  . Respiratory distress 12/11/2014    Orientation RESPIRATION BLADDER Height & Weight     Self, Time, Situation, Place  Normal Incontinent Weight: 156 lb 15.5 oz (71.2  kg) Height:  5\' 5"  (165.1 cm)  BEHAVIORAL SYMPTOMS/MOOD NEUROLOGICAL BOWEL NUTRITION STATUS   (none)  (none) Continent Diet (renal carb modified)  AMBULATORY STATUS COMMUNICATION OF NEEDS Skin   Total Care Verbally Normal                       Personal Care Assistance Level of Assistance  Bathing, Dressing Bathing Assistance: Maximum assistance   Dressing Assistance: Maximum assistance     Functional Limitations Info   (no issues)          SPECIAL CARE FACTORS FREQUENCY  PT (By licensed PT) (outpatient dialysis)                    Contractures Contractures Info: Not present    Additional Factors Info  Code Status, Isolation Precautions, Allergies   Allergies Info: codeine, contrast, tape, oxycodone, quinine     Isolation Precautions Info: vre     Current Medications (11/12/2016):  This is the current hospital active medication list Current Facility-Administered Medications  Medication Dose Route Frequency Provider Last Rate Last Dose  . acetaminophen (TYLENOL) tablet 650 mg  650 mg Oral Q6H PRN Enedina FinnerPatel, Sona, MD       Or  . acetaminophen (TYLENOL) suppository 650 mg  650 mg Rectal Q6H PRN Enedina FinnerPatel, Sona, MD      . albuterol (PROVENTIL) (2.5 MG/3ML) 0.083% nebulizer solution 2.5 mg  2.5 mg Nebulization Q6H PRN Enedina FinnerPatel, Sona, MD      . calcium acetate (PHOSLO) capsule 1,334 mg  1,334 mg Oral TID WC Enedina FinnerPatel, Sona, MD  1,334 mg at 11/12/16 1301  . diltiazem (CARDIZEM CD) 24 hr capsule 120 mg  120 mg Oral QHS Enedina Finner, MD   120 mg at 11/11/16 2156  . docusate sodium (COLACE) capsule 200 mg  200 mg Oral BID PRN Enedina Finner, MD      . epoetin alfa (EPOGEN,PROCRIT) injection 10,000 Units  10,000 Units Intravenous Q M,W,F-HD Lamont Dowdy, MD   10,000 Units at 11/11/16 1713  . famotidine (PEPCID) tablet 10 mg  10 mg Oral Daily Enedina Finner, MD   10 mg at 11/12/16 1119  . fentaNYL (DURAGESIC - dosed mcg/hr) patch 25 mcg  25 mcg Transdermal Q72H Enedina Finner, MD   25 mcg at  11/11/16 2202  . fentaNYL (SUBLIMAZE) injection 12.5 mcg  12.5 mcg Intravenous Q3H PRN Sherryll Burger, Vipul, MD      . insulin aspart (novoLOG) injection 0-9 Units  0-9 Units Subcutaneous TID WC Enedina Finner, MD      . multivitamin (RENA-VIT) tablet 1 tablet  1 tablet Oral Daily Enedina Finner, MD      . omega-3 acid ethyl esters (LOVAZA) capsule 1,000 mg  1,000 mg Oral BID Enedina Finner, MD   1,000 mg at 11/11/16 2156  . ondansetron (ZOFRAN) tablet 4 mg  4 mg Oral Q6H PRN Enedina Finner, MD       Or  . ondansetron [facility]) injection 4 mg  4 mg Intravenous Q6H PRN Enedina Finner, MD      . oxyCODONE (Oxy IR/ROXICODONE) immediate release tablet 5-10 mg  5-10 mg Oral Q6H PRN Delfino Lovett, MD      . PARoxetine (PAXIL) tablet 40 mg  40 mg Oral QHS Enedina Finner, MD   40 mg at 11/11/16 2156  . polyethylene glycol (MIRALAX / GLYCOLAX) packet 17 g  17 g Oral Daily PRN Enedina Finner, MD      . pravastatin (PRAVACHOL) tablet 20 mg  20 mg Oral QPM Enedina Finner, MD   20 mg at 11/11/16 2156  . traMADol (ULTRAM) tablet 50 mg  50 mg Oral Q6H PRN Enedina Finner, MD   50 mg at 11/12/16 0459  . vancomycin (VANCOCIN) IVPB 750 mg/150 ml premix  750 mg Intravenous Q M,W,F-HD Hallaji, Mardene Speak, RPH   Stopped at 11/11/16 1813  . warfarin (COUMADIN) tablet 2.5 mg  2.5 mg Oral q1800 Enedina Finner, MD   2.5 mg at 11/11/16 2158  . Warfarin - Pharmacist Dosing Inpatient   Does not apply [id number] Enedina Finner, MD         Discharge Medications: Please see discharge summary for a list of discharge medications.  Relevant Imaging Results:  Relevant Lab Results:   Additional Information ss: [id number] (patient has 40 medicare days left and has a secondary that picks up her copay)  York Spaniel, LCSW

## 2016-11-12 NOTE — Progress Notes (Signed)
[place] [facility] [place] NAME: [name]    MR#:  [id number]  DATE OF BIRTH:  1933-06-29  SUBJECTIVE:  CHIEF COMPLAINT:   Chief Complaint  Patient presents with  . Back Pain  . Shaking  Still having a lot of back pain REVIEW OF SYSTEMS:  Review of Systems  Constitutional: Positive for malaise/fatigue. Negative for chills, fever and weight loss.  HENT: Negative for congestion, ear discharge, hearing loss, nosebleeds and sore throat.   Eyes: Negative for blurred vision.  Respiratory: Negative for cough, shortness of breath and wheezing.   Cardiovascular: Negative for chest pain, palpitations, orthopnea, leg swelling and PND.  Gastrointestinal: Negative for abdominal pain, constipation, diarrhea, heartburn, nausea and vomiting.  Genitourinary: Negative for dysuria and urgency.  Musculoskeletal: Positive for back pain.  Skin: Negative for rash.  Neurological: Negative for dizziness, speech change, focal weakness, seizures and headaches.  Endo/Heme/Allergies: Does not bruise/bleed easily.  Psychiatric/Behavioral: Negative for depression.    DRUG ALLERGIES:   Allergies  Allergen Reactions  . Codeine Itching and Other (See Comments)    Reaction:  Confusion/Hallucinations   . Contrast Media [Iodinated Diagnostic Agents] Other (See Comments)    "skin peel" breaks into a blistered rash, then peel. If given Benadryl and solumedrol she will be okay  . Oxycodone Other (See Comments)    Reaction:  Confusion/Hallucinations   . Quinine Derivatives Rash  . Tape Other (See Comments)    Skin is very thin and will blister and pull off skin with any tape except paper tape.  PLEASE USE PAPER TAPE.    VITALS:  Blood pressure 115/61, pulse 87, temperature 98.2 F (36.8 C), temperature source Oral, resp. rate 18, height 5' 5"  (1.651 m), weight 71.2 kg (156 lb 15.5 oz), SpO2 100 %.  PHYSICAL EXAMINATION:  Physical Exam  GENERAL:  83 y.o.-year-old  patient lying in the bed, comfortable today  EYES: Pupils equal, round, reactive to light and accommodation. No scleral icterus. Extraocular muscles intact.  HEENT: Head atraumatic, normocephalic. Oropharynx and nasopharynx clear. Dry oral mucosa NECK:  Supple, no jugular venous distention. No thyroid enlargement, no tenderness.  LUNGS: Normal breath sounds bilaterally, no wheezing, rales,rhonchi or crepitation. No use of accessory muscles of respiration.  CARDIOVASCULAR S1. S2, irregularly irregular No  rubs, or gallops. 3/6 systolic murmur present ABDOMEN: Soft, nontender, nondistended. Bowel sounds present. No organomegaly or mass.  EXTREMITIES: No pedal edema, cyanosis, or clubbing. Right leg dressing in place significant discomfort in lumbar spine. Palpation  NEUROLOGIC: Cranial nerves II through XII are intact. Muscle strength 5/5 in all extremities. Sensation intact. Gait not checked.  PSYCHIATRIC: The patient is alert and oriented x 3.  SKIN: No obvious rash, lesion, or ulcer.  LABORATORY PANEL:   CBC  Recent Labs Lab 11/11/16 0926  WBC 8.4  HGB 10.0*  HCT 30.3*  PLT 222   ------------------------------------------------------------------------------------------------------------------  Chemistries   Recent Labs Lab 11/11/16 0926  NA 133*  K 4.5  CL 93*  CO2 20*  GLUCOSE 73  BUN 87*  CREATININE 8.60*  CALCIUM 7.8*  AST 23  ALT 8*  ALKPHOS 58  BILITOT 0.8   ------------------------------------------------------------------------------------------------------------------  Cardiac Enzymes No results for input(s): TROPONINI in the last 168 hours. ------------------------------------------------------------------------------------------------------------------  RADIOLOGY:  No results found.  EKG:   Orders placed or performed during the hospital encounter of 08/09/16  . EKG 12-Lead  . EKG 12-Lead  . EKG    ASSESSMENT AND PLAN:  Arville Go  Kozma  is a 83  y.o. female with a known history ofEnd stage renal disease on hemodialysis, chronic anemia, recently diagnosed with L23 discitis/osteomyelitis will finish a round of IV vancomycin and doxycycline that was discontinued about a week and a half ago. Patient's back pain continued to get worse to the point she was unable to ambulate or sit up in the bed without significant pain.  1. severe back pain lower secondary to discitis/osteomyelitis L2-3 vertebrae -Elevated ESR and CRP -Vancomycin per pharmacy with dialysis -Infectious disease Dr. Ola Spurr following  2. End stage renal disease -Nephrology consultation for in-house hemodialysis  3. Anemia of chronic disease -Transfuse as needed  4. History of atrial fibrillation on oral anticoagulation - continue warfarin -Pharmacy consult for managing warfarin dosage  5. DVT prophylaxis on warfarin  Discussed with daughter via phone -she feels patient will need replacement as she is difficult to manage at home  Physical and Occupational Therapy consultation  All the records are reviewed and case discussed with Care Management/Social Workerr. Management plans discussed with the patient, family (her daughter) and they are in agreement.  CODE STATUS: DNR  TOTAL TIME TAKING CARE OF THIS PATIENT: 30 minutes.   Discharge planning was discussed with her patient's daughter,  all questions were answered, she voiced understanding   POSSIBLE D/C IN 1-2 DAYS, DEPENDING ON CLINICAL CONDITION.   Max Sane M.D on 11/12/2016 at 11:05 AM  Between 7am to 6pm - Pager - [phone]  After 6pm go to [email or web address] - password [email or web address]  Office  [phone]  CC: Primary care physician; Hortencia Pilar, MD

## 2016-11-13 DIAGNOSIS — M869 Osteomyelitis, unspecified: Secondary | ICD-10-CM

## 2016-11-13 DIAGNOSIS — Z515 Encounter for palliative care: Secondary | ICD-10-CM

## 2016-11-13 DIAGNOSIS — Z7189 Other specified counseling: Secondary | ICD-10-CM

## 2016-11-13 DIAGNOSIS — M5442 Lumbago with sciatica, left side: Secondary | ICD-10-CM

## 2016-11-13 DIAGNOSIS — M4646 Discitis, unspecified, lumbar region: Principal | ICD-10-CM

## 2016-11-13 LAB — CBC
HEMATOCRIT: 31 % — AB (ref 35.0–47.0)
HEMOGLOBIN: 10.3 g/dL — AB (ref 12.0–16.0)
MCH: 30.7 pg (ref 26.0–34.0)
MCHC: 33.3 g/dL (ref 32.0–36.0)
MCV: 92.2 fL (ref 80.0–100.0)
Platelets: 208 10*3/uL (ref 150–440)
RBC: 3.36 MIL/uL — ABNORMAL LOW (ref 3.80–5.20)
RDW: 17.6 % — ABNORMAL HIGH (ref 11.5–14.5)
WBC: 7.7 10*3/uL (ref 3.6–11.0)

## 2016-11-13 LAB — RENAL FUNCTION PANEL
ANION GAP: 15 (ref 5–15)
Albumin: 2.8 g/dL — ABNORMAL LOW (ref 3.5–5.0)
BUN: 52 mg/dL — ABNORMAL HIGH (ref 6–20)
CHLORIDE: 95 mmol/L — AB (ref 101–111)
CO2: 22 mmol/L (ref 22–32)
Calcium: 8.7 mg/dL — ABNORMAL LOW (ref 8.9–10.3)
Creatinine, Ser: 5.85 mg/dL — ABNORMAL HIGH (ref 0.44–1.00)
GFR calc Af Amer: 7 mL/min — ABNORMAL LOW (ref >60)
GFR calc non Af Amer: 6 mL/min — ABNORMAL LOW (ref >60)
GLUCOSE: 180 mg/dL — AB (ref 65–99)
POTASSIUM: 4.7 mmol/L (ref 3.5–5.1)
Phosphorus: 6.1 mg/dL — ABNORMAL HIGH (ref 2.5–4.6)
SODIUM: 132 mmol/L — AB (ref 135–145)

## 2016-11-13 LAB — GLUCOSE, CAPILLARY
GLUCOSE-CAPILLARY: 115 mg/dL — AB (ref 65–99)
GLUCOSE-CAPILLARY: 129 mg/dL — AB (ref 65–99)
GLUCOSE-CAPILLARY: 125 mg/dL — AB (ref 65–99)
Glucose-Capillary: 106 mg/dL — ABNORMAL HIGH (ref 65–99)

## 2016-11-13 LAB — PROTIME-INR
INR: 2.64
PROTHROMBIN TIME: 28.7 s — AB (ref 11.4–15.2)

## 2016-11-13 MED ORDER — DOCUSATE SODIUM 100 MG PO CAPS
100.0000 mg | ORAL_CAPSULE | Freq: Two times a day (BID) | ORAL | Status: DC
  Administered 2016-11-13 – 2016-11-18 (×12): 100 mg via ORAL
  Filled 2016-11-13 (×12): qty 1

## 2016-11-13 MED ORDER — OXYCODONE HCL 5 MG PO TABS
5.0000 mg | ORAL_TABLET | ORAL | Status: DC | PRN
  Administered 2016-11-13 – 2016-11-16 (×5): 10 mg via ORAL
  Filled 2016-11-13 (×5): qty 2

## 2016-11-13 MED ORDER — CYCLOBENZAPRINE HCL 10 MG PO TABS
5.0000 mg | ORAL_TABLET | Freq: Three times a day (TID) | ORAL | Status: DC | PRN
  Administered 2016-11-13 – 2016-11-19 (×5): 5 mg via ORAL
  Filled 2016-11-13 (×5): qty 1

## 2016-11-13 NOTE — Progress Notes (Signed)
POST DIALYSIS ASSESSMENT 

## 2016-11-13 NOTE — Progress Notes (Signed)
Patient was taken to dialysis unit on a wheelchair and sat up on the chair for less than 10 mins and c/o left hip pain eventhough she was pre-medicated with oral pain med and flexeril.. Dialysis nurse called me and requested a bed for her so she can be on bed during treatment. Dr. Valetta MoleKoulluro is aware of the issue and allowed patient to stay on bed during treatment. Like I told the dialysis nurse, she can have IV pain med for instant pain relief, but this is not gonna help with discharge plan.

## 2016-11-13 NOTE — Progress Notes (Signed)
New referral for Palliative to follow at SNF received from Palliative Medicine NP Vennie HomansMegan Mason. Family/patient awaiting bed offers. Patient information faxed to referral. Dayna BarkerKaren Robertson RN, BSN, [facility] [place] [place], hospital Liaison [phone] c

## 2016-11-13 NOTE — Consult Note (Signed)
Consultation Note Date: 11/13/2016   Patient Name: [name]  DOB: February 20, 1934  MRN: [id number]  Age / Sex: 83 y.o., female  PCP: Natalie Pilar, MD Referring Physician: Max Sane, MD  Reason for Consultation: Establishing goals of care  HPI/Patient Profile: 83 y.o. female  with past medical history of ESRD on HD, CVA, pulmonary hypertension, restless leg, PVD, HTN, GERD, CHF, afib, anxiety, arthritis, and anemia admitted on 11/11/2016 with back pain. Recent diagnosis of L2-3 discitis and osteomyelitis. Followed outpatient by Dr. Ola [name]. In ED, CT spine showed worsening L2-3 discitis. Started back on IV antibiotics. Evaluated by neurosurgery. She is not a candidate for surgery due to age and multiple co-morbidities. Recommending brace and pain management. Plan is for outpatient IV vancomycin for 12 weeks. Palliative medicine consultation for goals of care.   Clinical Assessment and Goals of Care: I have reviewed medical records, discussed with care team, and met with patient and daughter [name]) at bedside to discuss diagnosis, GOC, EOL wishes, disposition and options. Initially just with patient. Daughter arrived shortly after.   Introduced Palliative Medicine as specialized medical care for people living with serious illness. It focuses on providing relief from the symptoms and stress of a serious illness. The goal is to improve quality of life for both the patient and the family.  Patient awake, alert, and oriented. At the beginning of our conversation, the patient started having a sharp, shooting pain in her lower back and down her left leg. She describes this as a spasm. RN came to bedside to give prn oxycodone. I have also ordered prn flexeril. Patient agrees. Pain relieved by lying flat.   We discussed a brief life review of the patient. Lives with daughter [name]. She has four children and many grand  and great-grandchildren. Husband died in 2007-08-19. 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 She is a woman of strong [employer] faith and has a son that is a Theme park manager.    She has been on dialysis for 3 years and tolerating well. Speaks of her vascular problems and wound management at the clinic. Patient tells me she fell in December and "has not walked the same since." Daughter shares that she has a fear of falling. Prior to hospitalization, patient mainly bed/chair bound but able to stand and pivot. She has experienced a decline in functional status since her IV antibiotics were discontinued and with increased pain. She does have a good appetite.     Discussed hospital diagnoses, interventions, and underlying co-morbidities. Daughter and patient have a good understanding that she is NOT a candidate for a surgical procedure due to age and co-morbidities. They understand this pain will be ongoing and she will likely require a pain management regimen. The plan is for 12 weeks of IV antibiotics.    Advanced directives, concepts specific to code status, and artifical feeding and hydration were discussed. Confirms DNR/DNI. [name] is HCPOA and documentation reviewed in EPIC. Patient is not afraid of dying. "  I'm ready when my time comes."   Daughter states "I don't want to lose her" but "there is worse in life than dying." As her primary caregiver, [name] sees her "sufferinig in pain" and "difficult to watch." [name] has discussed EOL wishes with her mother many times. She speaks of being the "best advocate" for her mother out of all the children because she will "make the right decisions when she cannot speak for herself."     Educated on support from palliative services in the future understanding this can transition to hospice services when appropriate.   Questions and concerns were addressed. Therapeutic listening and emotional/spiritual support provided.     SUMMARY OF RECOMMENDATIONS    DNR/DNI  Continue current interventions. Plan is discharge to rehab.   Symptom management--see below.   Patient/daughter agreeable with palliative services to follow at SNF.   Code Status/Advance Care Planning:  DNR  Symptom Management:   Continue Fentanyl 51mg patch  Continue prn Tramadol 5102mPO q6h prn moderate pain and oxycodone 5-1040mO q6h prn severe pain  Flexeril 5mg32m TID prn muscle spasms  Colace 100mg74mBID   Miralax 17g PO daily prn   Palliative Prophylaxis:   Bowel Regimen, Delirium Protocol, Frequent Pain Assessment and Oral Care  Additional Recommendations (Limitations, Scope, Preferences):  Full Scope Treatment-except DNR/DNI  Psycho-social/Spiritual:   Desire for further Chaplaincy support:yes  Additional Recommendations: Caregiving  Support/Resources and Education on Hospice  Prognosis:   Unable to determine  [facility] service [facility]      Primary Diagnoses: Present on Admission: . Discitis   I have reviewed the medical record, interviewed the patient and family, and examined the patient. The following aspects are pertinent.  Past Medical History:  Diagnosis Date  . Anemia   . Anxiety   . Arthritis    gout  . Atrial fibrillation (HCC) [place] CHF (congestive heart failure[place]HCC[place] but has had several times since  . Chronic kidney disease    end stage renal insufficiency.  on dialysis M,W,F  . Diabetes mellitus without complication (HCC) [place] [facility]  . Dysrhythmia    afib/flutter  . GERD (gastroesophageal reflux disease)   . Hypertension   . Neuropathy due to type 2 diabetes mellitus (HCC) [place] Peripheral vascular disease (HCC) [place] Pleural effusion   . Pulmonary hypertension (HCC) [place] Renal insufficiency   . Restless leg syndrome   . Shortness  of breath dyspnea    with exertion  . Stroke (HCC) [place]2017   has had 1 other stroke. first one affected vision. the most recent one affected right arm   Social History   Social History  . Marital status: Widowed    Spouse name: N/A  . Number of children: N/A  . Years of education: N/A   Social History Main Topics  . Smoking status: Never Smoker  . Smokeless tobacco: Never Used  . Alcohol use No  . Drug use: No  . Sexual activity: No   Other Topics Concern  . None   Social History Narrative  . None   Family History  Problem Relation Age of Onset  . Colon cancer Mother   . Stroke Father   . Diabetes Brother    Scheduled Meds: . calcium acetate  1,334 mg Oral  TID WC  . diltiazem  120 mg Oral QHS  . docusate sodium  100 mg Oral BID  . epoetin (EPOGEN/PROCRIT) injection  10,000 Units Intravenous Q M,W,F-HD  . famotidine  10 mg Oral Daily  . fentaNYL  25 mcg Transdermal Q72H  . insulin aspart  0-9 Units Subcutaneous TID WC  . multivitamin  1 tablet Oral Daily  . omega-3 acid ethyl esters  1,000 mg Oral BID  . PARoxetine  40 mg Oral QHS  . pravastatin  20 mg Oral QPM  . warfarin  2.5 mg Oral q1800  . Warfarin - Pharmacist Dosing Inpatient   Does not apply q1800   Continuous Infusions: . vancomycin Stopped (11/11/16 1813)   PRN Meds:.acetaminophen **OR** acetaminophen, albuterol, cyclobenzaprine, fentaNYL (SUBLIMAZE) injection, ondansetron **OR** ondansetron (ZOFRAN) IV, oxyCODONE, polyethylene glycol, traMADol Medications Prior to Admission:  Prior to Admission medications   Medication Sig Start Date End Date Taking? Authorizing Provider  acetaminophen (TYLENOL) 650 MG CR tablet Take 1,300 mg by mouth 2 (two) times daily.   Yes [provider]  albuterol (PROVENTIL HFA;VENTOLIN HFA) 108 (90 Base) MCG/ACT inhaler Inhale 1 puff into the lungs every 6 (six) hours as needed for wheezing or shortness of breath.    Yes [provider]  Biotin 1 MG CAPS  Take 1 mg by mouth daily.    Yes [provider]  calcium acetate (PHOSLO) 667 MG tablet Take 1,334 mg by mouth 3 (three) times daily with meals. With any food   Yes [provider]  diltiazem (CARDIZEM CD) 120 MG 24 hr capsule Take 120 mg by mouth at bedtime.  10/15/16  Yes [provider]  docusate sodium (COLACE) 100 MG capsule Take 200 mg by mouth 2 (two) times daily.    Yes [provider]  folic acid-vitamin b complex-vitamin c-selenium-zinc (DIALYVITE) 3 MG TABS tablet Take 1 tablet by mouth daily.   Yes [provider]  furosemide (LASIX) 80 MG tablet Take 80 mg by mouth 2 (two) times daily.   Yes [provider]  gabapentin (NEURONTIN) 100 MG capsule Take 100 mg by mouth every Tuesday, Thursday, Saturday, and Sunday. At bedtime   Yes [provider]  gabapentin (NEURONTIN) 300 MG capsule Take 300 mg by mouth every Monday, Wednesday, and Friday at 8 PM. At bedtime   Yes [provider]  insulin detemir (LEVEMIR) 100 UNIT/ML injection Inject 0.06 mLs (6 Units total) into the skin 2 (two) times daily. Patient taking differently: Inject 10 Units into the skin at bedtime.  02/18/16  Yes Mody, Ulice Bold, MD  lidocaine-prilocaine (EMLA) cream Apply 1 application topically as needed (port access). Mon, Wed, Fri before dialysis   Yes [provider]  Omega-3 Fatty Acids (FISH OIL) 1000 MG CAPS Take 1,000 mg by mouth 2 (two) times daily.   Yes [provider]  PARoxetine (PAXIL) 40 MG tablet Take 40 mg by mouth at bedtime.    Yes [provider]  polyethylene glycol (MIRALAX / GLYCOLAX) packet Take 17 g by mouth as needed.    Yes [provider]  pravastatin (PRAVACHOL) 20 MG tablet Take 20 mg by mouth every evening.    Yes [provider]  ranitidine (ZANTAC) 150 MG tablet Take 150 mg by mouth at bedtime.    Yes [provider]  traMADol (ULTRAM) 50 MG tablet Take 1 tablet (50 mg  total) by mouth every 6 (six) hours as needed for moderate pain. 08/17/16  Yes Vaickute,  Rima, MD  warfarin (COUMADIN) 2.5 MG tablet Take 2.5 mg by mouth daily at 6 PM.    Yes [provider]  vancomycin (VANCOCIN) 1-5 GM/200ML-% SOLN Inject 200 mLs (1,000 mg total) into the vein every Monday, Wednesday, and Friday with hemodialysis. Patient not taking: Reported on 10/08/2016 08/19/16   Theodoro Grist, MD   Allergies  Allergen Reactions  . Codeine Itching and Other (See Comments)    Reaction:  Confusion/Hallucinations   . Contrast Media [Iodinated Diagnostic Agents] Other (See Comments)    "skin peel" breaks into a blistered rash, then peel. If given Benadryl and solumedrol she will be okay  . Oxycodone Other (See Comments)    Reaction:  Confusion/Hallucinations   . Quinine Derivatives Rash  . Tape Other (See Comments)    Skin is very thin and will blister and pull off skin with any tape except paper tape.  PLEASE USE PAPER TAPE.   Review of Systems  Constitutional: Positive for activity change.       Back pain  Neurological: Positive for tremors and weakness.  All other systems reviewed and are negative.  Physical Exam  Constitutional: She is oriented to person, place, and time. She is cooperative.  Cardiovascular: Regular rhythm.   Pulmonary/Chest: Effort normal and breath sounds normal.  Abdominal: Normal appearance.  Musculoskeletal:       Lumbar back: She exhibits tenderness and spasm.  Neurological: She is alert and oriented to person, place, and time.  Skin: Skin is warm and dry.  Psychiatric: She has a normal mood and affect. Her speech is normal and behavior is normal. Cognition and memory are normal.  Nursing note and vitals reviewed.  Vital Signs: BP 130/74 (BP Location: Left Arm)   Pulse 75   Temp 98.1 F (36.7 C) (Oral)   Resp 16   Ht 5' 5"  (1.651 m)   Wt 71.2 kg (156 lb 15.5 oz)   SpO2 100%   BMI 26.12 kg/m  Pain Assessment: 0-10   Pain Score:  Asleep   SpO2: SpO2: 100 % O2 Device:SpO2: 100 % O2 Flow Rate: .O2 Flow Rate (L/min): 2 L/min  IO: Intake/output summary:   Intake/Output Summary (Last 24 hours) at 11/13/16 1003 Last data filed at 11/12/16 1700  Gross per 24 hour  Intake              240 ml  Output                0 ml  Net              240 ml    LBM: Last BM Date: 11/12/16 Baseline Weight: Weight: 74 kg (163 lb 2.3 oz) Most recent weight: Weight: 71.2 kg (156 lb 15.5 oz)     Palliative Assessment/Data: PPS 50%   Flowsheet Rows     Most Recent Value  Intake Tab  Referral Department  Hospitalist  Unit at Time of Referral  Cardiac/Telemetry Unit  Palliative Care Primary Diagnosis  Sepsis/Infectious Disease  Palliative Care Type  New Palliative care  Reason for referral  Clarify Goals of Care  Date first seen by Palliative Care  11/13/16  Clinical Assessment  Palliative Performance Scale Score  50%  Psychosocial & Spiritual Assessment  Palliative Care Outcomes  Patient/Family meeting held?  Yes  Who was at the meeting?  patient and daughter ([name])  Palliative Care Outcomes  Clarified goals of care, Counseled regarding hospice, ACP counseling assistance, Provided psychosocial or spiritual support, Linked to  palliative care logitudinal support, Improved pain interventions, Improved non-pain symptom therapy      Time In: 0845 Time Out: 1000 Time Total: 83mn Greater than 50%  of this time was spent counseling and coordinating care related to the above assessment and plan.  Signed by:  MIhor Dow FNP-C Palliative Medicine Team  Phone: [phone]: [phone]  Please contact Palliative Medicine Team phone at [phone] questions and concerns.  For individual provider: See AShea Evans

## 2016-11-13 NOTE — Progress Notes (Signed)
Hd started  

## 2016-11-13 NOTE — Progress Notes (Signed)
[facility]  ROUNDING NOTE   Subjective:   Hemodialysis for later today.  Reports no pain.  Unable to get out of bed.   Objective:  Vital signs in last 24 hours:  Temp:  [98.1 F (36.7 C)-98.7 F (37.1 C)] 98.1 F (36.7 C) (06/15 0453) Pulse Rate:  [75-103] 75 (06/15 0453) Resp:  [16-20] 16 (06/15 0453) BP: (130-141)/(58-74) 130/74 (06/15 0453) SpO2:  [100 %] 100 % (06/15 0453)  Weight change:  Filed Weights   11/11/16 0916 11/11/16 1510 11/11/16 1812  Weight: 74 kg (163 lb 2.3 oz) 71 kg (156 lb 8.4 oz) 71.2 kg (156 lb 15.5 oz)    Intake/Output: I/O last 3 completed shifts: In: 360 [P.O.:360] Out: 0    Intake/Output this shift:  No intake/output data recorded.  Physical Exam: General: NAD, laying in bed, frail  Head: Normocephalic, atraumatic. Moist oral mucosal membranes  Eyes: Anicteric, PERRL  Neck: Supple, trachea midline  Lungs:  Clear to auscultation  Heart: Regular rate and rhythm  Abdomen:  Soft, nontender,   Extremities: no peripheral edema.  Neurologic: Nonfocal, moving all four extremities  Skin: No lesions  Access: Right AVF    Basic Metabolic Panel:  Recent Labs Lab 11/11/16 0926 11/12/16 0003  NA 133*  --   K 4.5  --   CL 93*  --   CO2 20*  --   GLUCOSE 73  --   BUN 87*  --   CREATININE 8.60*  --   CALCIUM 7.8*  --   PHOS 8.1* 3.9    Liver Function Tests:  Recent Labs Lab 11/11/16 0926  AST 23  ALT 8*  ALKPHOS 58  BILITOT 0.8  PROT 6.8  ALBUMIN 2.9*   No results for input(s): LIPASE, AMYLASE in the last 168 hours. No results for input(s): AMMONIA in the last 168 hours.  CBC:  Recent Labs Lab 11/11/16 0926  WBC 8.4  HGB 10.0*  HCT 30.3*  MCV 92.9  PLT 222    Cardiac Enzymes: No results for input(s): CKTOTAL, CKMB, CKMBINDEX, TROPONINI in the last 168 hours.  BNP: Invalid input(s): POCBNP  CBG:  Recent Labs Lab 11/12/16 1149 11/12/16 1709 11/12/16 2116 11/13/16 0737 11/13/16 1233   GLUCAP 99 118* 104* 106* 115*    Microbiology: Results for orders placed or performed during the hospital encounter of 11/11/16  Blood culture (routine x 2)     Status: None (Preliminary result)   Collection Time: 11/11/16  9:45 AM  Result Value Ref Range Status   Specimen Description BLOOD L AC  Final   Special Requests   Final    BOTTLES DRAWN AEROBIC AND ANAEROBIC Blood Culture adequate volume   Culture NO GROWTH 2 DAYS  Final   Report Status PENDING  Incomplete  Blood culture (routine x 2)     Status: None (Preliminary result)   Collection Time: 11/11/16 10:00 AM  Result Value Ref Range Status   Specimen Description BLOOD L FA  Final   Special Requests   Final    BOTTLES DRAWN AEROBIC AND ANAEROBIC Blood Culture adequate volume   Culture NO GROWTH 2 DAYS  Final   Report Status PENDING  Incomplete  MRSA PCR Screening     Status: None   Collection Time: 11/12/16 12:49 AM  Result Value Ref Range Status   MRSA by PCR NEGATIVE NEGATIVE Final    Comment:        The GeneXpert MRSA Assay (FDA approved for NASAL specimens  only), is one component of a comprehensive MRSA colonization surveillance program. It is not intended to diagnose MRSA infection nor to guide or monitor treatment for MRSA infections.     Coagulation Studies:  Recent Labs  11/11/16 1333 11/12/16 0003  LABPROT 27.1* 27.9*  INR 2.46 2.55    Urinalysis: No results for input(s): COLORURINE, LABSPEC, PHURINE, GLUCOSEU, HGBUR, BILIRUBINUR, KETONESUR, PROTEINUR, UROBILINOGEN, NITRITE, LEUKOCYTESUR in the last 72 hours.  Invalid input(s): APPERANCEUR    Imaging: No results found.   Medications:   . vancomycin Stopped (11/11/16 1813)   . calcium acetate  1,334 mg Oral TID WC  . diltiazem  120 mg Oral QHS  . docusate sodium  100 mg Oral BID  . epoetin (EPOGEN/PROCRIT) injection  10,000 Units Intravenous Q M,W,F-HD  . famotidine  10 mg Oral Daily  . fentaNYL  25 mcg Transdermal Q72H  . insulin  aspart  0-9 Units Subcutaneous TID WC  . multivitamin  1 tablet Oral Daily  . omega-3 acid ethyl esters  1,000 mg Oral BID  . PARoxetine  40 mg Oral QHS  . pravastatin  20 mg Oral QPM  . warfarin  2.5 mg Oral q1800  . Warfarin - Pharmacist Dosing Inpatient   Does not apply q1800   acetaminophen **OR** acetaminophen, albuterol, cyclobenzaprine, fentaNYL (SUBLIMAZE) injection, ondansetron **OR** ondansetron (ZOFRAN) IV, oxyCODONE, polyethylene glycol, traMADol  Assessment/ Plan:  [name] is a 83 y.o. white female with atrial fibrillation, hypertension, gout, insulin-dependent diabetes mellitus type 2, diabetic neuropathy, CVA with right visual field blindness, hyperlipidemia, major depressive disorder, overactive bladder with incontinence, generalized anxiety disorder, congestive heart failure diastolic, parathyroidectomy, and osteoarthritis. Currently with discitis  MWF [facility] [address]   1. End Stage Renal Disease: MWF.  Dialysis for later today. Orders prepared.  - Continue MWF schedule  2. Discitis:  - Appreciate ID input - IV vancomycin  - Consult neurosurgery  3. Hypertension:   - diltiazem  4. Secondary Hyperparathyroidism:   - calcium acetate   5. Anemia of chronic kidney disease: hemoglobin 10 - epo with HD treatment.    LOS: 2 Kore Madlock 6/15/20181:23 PM

## 2016-11-13 NOTE — Clinical Social Work Note (Signed)
Bed offers extended to patient's daughter and [facility] was chosen. CSW has informed [facility]. Patient more than likely ready for discharge tomorrow. Patient will need Palliative Care to follow at [facility]. Physician is aware. York SpanielMonica Annalaura Sauseda MSW,LCSW [phone]

## 2016-11-13 NOTE — Progress Notes (Signed)
[facility] [place] NAME: [name]    MR#:  [id number]  DATE OF BIRTH:  11-05-1933  SUBJECTIVE:  CHIEF COMPLAINT:   Chief Complaint  Patient presents with  . Back Pain  . Shaking  Awake and alert to, pain is much better controlled.  She feels confident that she will be able to sit up for dialysis. REVIEW OF SYSTEMS:  Review of Systems  Constitutional: Positive for malaise/fatigue. Negative for chills, fever and weight loss.  HENT: Negative for congestion, ear discharge, hearing loss, nosebleeds and sore throat.   Eyes: Negative for blurred vision.  Respiratory: Negative for cough, shortness of breath and wheezing.   Cardiovascular: Negative for chest pain, palpitations, orthopnea, leg swelling and PND.  Gastrointestinal: Negative for abdominal pain, constipation, diarrhea, heartburn, nausea and vomiting.  Genitourinary: Negative for dysuria and urgency.  Musculoskeletal: Positive for back pain.  Skin: Negative for rash.  Neurological: Negative for dizziness, speech change, focal weakness, seizures and headaches.  Endo/Heme/Allergies: Does not bruise/bleed easily.  Psychiatric/Behavioral: Negative for depression.   DRUG ALLERGIES:   Allergies  Allergen Reactions  . Codeine Itching and Other (See Comments)    Reaction:  Confusion/Hallucinations   . Contrast Media [Iodinated Diagnostic Agents] Other (See Comments)    "skin peel" breaks into a blistered rash, then peel. If given Benadryl and solumedrol she will be okay  . Oxycodone Other (See Comments)    Reaction:  Confusion/Hallucinations   . Quinine Derivatives Rash  . Tape Other (See Comments)    Skin is very thin and will blister and pull off skin with any tape except paper tape.  PLEASE USE PAPER TAPE.    VITALS:  Blood pressure 130/74, pulse 75, temperature 98.1 F (36.7 C), temperature source Oral, resp. rate 16, height _0  (1.651 m), weight 71.2 kg (156 lb 15.5  oz), SpO2 100 %.  PHYSICAL EXAMINATION:  Physical Exam  GENERAL:  83 y.o.-year-old patient lying in the bed, comfortable today  EYES: Pupils equal, round, reactive to light and accommodation. No scleral icterus. Extraocular muscles intact.  HEENT: Head atraumatic, normocephalic. Oropharynx and nasopharynx clear. Dry oral mucosa NECK:  Supple, no jugular venous distention. No thyroid enlargement, no tenderness.  LUNGS: Normal breath sounds bilaterally, no wheezing, rales,rhonchi or crepitation. No use of accessory muscles of respiration.  CARDIOVASCULAR S1. S2, irregularly irregular No  rubs, or gallops. 3/6 systolic murmur present ABDOMEN: Soft, nontender, nondistended. Bowel sounds present. No organomegaly or mass.  EXTREMITIES: No pedal edema, cyanosis, or clubbing.  Some discomfort in lumbar spine on Palpation  NEUROLOGIC: Cranial nerves II through XII are intact. Muscle strength 5/5 in all extremities. Sensation intact. Gait not checked.  PSYCHIATRIC: The patient is alert and oriented x 3.  SKIN: No obvious rash, lesion, or ulcer.  LABORATORY PANEL:   CBC  Recent Labs Lab 11/11/16 0926  WBC 8.4  HGB 10.0*  HCT 30.3*  PLT 222   ------------------------------------------------------------------------------------------------------------------  Chemistries   Recent Labs Lab 11/11/16 0926  NA 133*  K 4.5  CL 93*  CO2 20*  GLUCOSE 73  BUN 87*  CREATININE 8.60*  CALCIUM 7.8*  AST 23  ALT 8*  ALKPHOS 55  BILITOT 0.8   ASSESSMENT AND PLAN:  [name]  is a 83 y.o. female with a known history ofEnd stage renal disease on hemodialysis, chronic anemia, recently diagnosed with L2-3 discitis/osteomyelitis will finish a round of IV vancomycin and doxycycline that was discontinued about  a week and a half ago. Patient's back pain continued to get worse to the point she was unable to ambulate or sit up in the bed without significant pain.  1. Severe back pain lower secondary  to discitis/osteomyelitis L2-3 vertebrae -Elevated ESR and CRP -Vancomycin per pharmacy with dialysis for 12 weeks -Infectious disease Dr. Ola Spurr following - Appreciate neurosurgical evaluation, not a surgical candidate - Can consider facet joint injection as an outpatient with pain management.  If pain continues to be an issue - lumbar spinal orthotic (LSO) from [employer].  This might significantly improve pain from biomechanical instability per neurosurgery - Consult palliative care  2. End stage renal disease -Nephrology following for in-house hemodialysis  3. Anemia of chronic disease: Hemoglobin 10.0, stable -Transfuse as needed  4. History of atrial fibrillation on oral anticoagulation - continue warfarin, INR 2.55 -Pharmacy managing warfarin dosage  5. DVT prophylaxis on warfarin  Discussed with daughter [name] via phone - she is in agreement with management plan.  Physical and Occupational Therapy recommends rehabilitation - she will likely benefit from short-term rehabilitation with palliative care following her there.  At this point she needs 3 midnight stay due to her traditional Medicare for her to be placed to rehabilitation.  She may be able to go over the weekend.  All the records are reviewed and case discussed with Care Management/Social Workerr. Management plans discussed with the patient, family (her daughter) and they are in agreement.  CODE STATUS: DNR  TOTAL TIME TAKING CARE OF THIS PATIENT: 30 minutes.   Discharge planning was discussed with her patient's daughter,  all questions were answered, she voiced understanding   POSSIBLE D/C IN 1-2 DAYS, DEPENDING ON CLINICAL CONDITION.   Max Sane M.D on 11/13/2016 at 9:03 AM  Between 7am to 6pm - Pager - [phone]  After 6pm go to [email or web address] - password [email or web address] [facility]  Office  [phone]  CC: Primary care physician; Hortencia Pilar, MD

## 2016-11-13 NOTE — Progress Notes (Signed)
ANTICOAGULATION CONSULT NOTE - Initial Consult  Pharmacy Consult for Warfarin dosing Indication: atrial fibrillation  Allergies  Allergen Reactions  . Codeine Itching and Other (See Comments)    Reaction:  Confusion/Hallucinations   . Contrast Media [Iodinated Diagnostic Agents] Other (See Comments)    "skin peel" breaks into a blistered rash, then peel. If given Benadryl and solumedrol she will be okay  . Oxycodone Other (See Comments)    Reaction:  Confusion/Hallucinations   . Quinine Derivatives Rash  . Tape Other (See Comments)    Skin is very thin and will blister and pull off skin with any tape except paper tape.  PLEASE USE PAPER TAPE.    Patient Measurements: Height: 5\' 5"  (165.1 cm) Weight: 156 lb 15.5 oz (71.2 kg) IBW/kg (Calculated) : 57  Vital Signs: Temp: 97.9 F (36.6 C) (06/15 1351) Temp Source: Oral (06/15 1351) BP: 113/55 (06/15 1351) Pulse Rate: 84 (06/15 1510)  Labs:  Recent Labs  11/11/16 0926 11/11/16 1333 11/12/16 0003 11/13/16 1400 11/13/16 1505  HGB 10.0*  --   --   --  10.3*  HCT 30.3*  --   --   --  31.0*  PLT 222  --   --   --  208  LABPROT  --  27.1* 27.9* 28.7*  --   INR  --  2.46 2.55 2.64  --   CREATININE 8.60*  --   --   --   --     Estimated Creatinine Clearance: 5.1 mL/min (A) (by C-G formula based on SCr of 8.6 mg/dL (H)).   Medical History: Past Medical History:  Diagnosis Date  . Anemia   . Anxiety   . Arthritis    gout  . Atrial fibrillation (HCC)   . CHF (congestive heart failure) (HCC)    has had this off and on prior to dialysis but has had several times since  . Chronic kidney disease    end stage renal insufficiency.  on dialysis M,W,F  . Diabetes mellitus without complication (HCC)   . Dialysis patient [facility](HCC)    Mon.- Wed- Fri.  . Dysrhythmia    afib/flutter  . GERD (gastroesophageal reflux disease)   . Hypertension   . 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 Peripheral vascular disease (HCC)    . Pleural effusion   . Pulmonary hypertension (HCC)   . Renal insufficiency   . Restless leg syndrome   . Shortness of breath dyspnea    with exertion  . Stroke (HCC) 06/2015   has had 1 other stroke. first one affected vision. the most recent one affected right arm   Assessment: 83 yo female with PMH of A.Fib, admitted with Discitis. Pharmacy consulted for warfarin dosing and monitoring. INR on admission 2.46.  Home Regimen: warfarin 2.5mg  QD  DATE  INR  DOSE 6/13  2.46  2.5mg  6/14  2.55  2.5 mg 6/15  2.64  2.5 mg  Goal of Therapy:  INR 2-3 Monitor platelets by anticoagulation protocol: Yes   Plan:  Will continue patient's home dose of Warfarin 2.5mg  daily. Patient has been started on Vancomycin. Will monitor INR daily while on Abx per protocol.   Natalie FrostNathan A Jaylyn [name], PharmD, BCPS Clinical Pharmacist 11/13/2016 3:40 PM

## 2016-11-13 NOTE — Progress Notes (Signed)
HD COMPLETED  

## 2016-11-13 NOTE — Progress Notes (Signed)
PT Cancellation Note  Patient Details Name: [name] MRN: [id number] DOB: Jan 12, 1934   Cancelled Treatment:    Reason Eval/Treat Not Completed: Patient at procedure or test/unavailable Pt out of room for dialysis, unable to see pt for PT session.   Malachi ProGalen R Lachandra Dettmann 11/13/2016, 3:31 PM

## 2016-11-13 NOTE — Clinical Social Work Note (Addendum)
Patient not able to tolerate sitting for dialysis this afternoon. CSW contacted Dr. Ronn MelenaKolloru regarding this and he stated that he was aware and that she needed to stay in the bed currently for dialysis and that he and Dr. Sampson GoonFitzgerald discussed yesterday that patient would more than likely need to be on several more days of intravenous antibiotics before trying to have her sit up for dialysis. CSW has relayed this information to the attending, Dr. Sherryll BurgerShah.  Patient will not be able to discharge until it is known she can sit up for dialysis and tolerate for the duration.  York SpanielMonica Kashari Chalmers MSW,LCSW [phone]

## 2016-11-14 LAB — BASIC METABOLIC PANEL
ANION GAP: 8 (ref 5–15)
BUN: 22 mg/dL — ABNORMAL HIGH (ref 6–20)
CHLORIDE: 98 mmol/L — AB (ref 101–111)
CO2: 29 mmol/L (ref 22–32)
Calcium: 8.6 mg/dL — ABNORMAL LOW (ref 8.9–10.3)
Creatinine, Ser: 3.41 mg/dL — ABNORMAL HIGH (ref 0.44–1.00)
GFR calc non Af Amer: 12 mL/min — ABNORMAL LOW (ref >60)
GFR, EST AFRICAN AMERICAN: 14 mL/min — AB (ref >60)
GLUCOSE: 150 mg/dL — AB (ref 65–99)
Potassium: 3.7 mmol/L (ref 3.5–5.1)
Sodium: 135 mmol/L (ref 135–145)

## 2016-11-14 LAB — CBC
HEMATOCRIT: 32.1 % — AB (ref 35.0–47.0)
HEMOGLOBIN: 10.6 g/dL — AB (ref 12.0–16.0)
MCH: 30.6 pg (ref 26.0–34.0)
MCHC: 32.9 g/dL (ref 32.0–36.0)
MCV: 92.9 fL (ref 80.0–100.0)
Platelets: 199 10*3/uL (ref 150–440)
RBC: 3.45 MIL/uL — ABNORMAL LOW (ref 3.80–5.20)
RDW: 17.7 % — ABNORMAL HIGH (ref 11.5–14.5)
WBC: 6.4 10*3/uL (ref 3.6–11.0)

## 2016-11-14 LAB — PROTIME-INR
INR: 2.53
Prothrombin Time: 27.7 seconds — ABNORMAL HIGH (ref 11.4–15.2)

## 2016-11-14 LAB — GLUCOSE, CAPILLARY
GLUCOSE-CAPILLARY: 119 mg/dL — AB (ref 65–99)
GLUCOSE-CAPILLARY: 127 mg/dL — AB (ref 65–99)
GLUCOSE-CAPILLARY: 113 mg/dL — AB (ref 65–99)
Glucose-Capillary: 120 mg/dL — ABNORMAL HIGH (ref 65–99)

## 2016-11-14 NOTE — Progress Notes (Signed)
[facility]  ROUNDING NOTE   Subjective:   Hemodialysis treatment yesterday. Did not tolerate dialysis in a chair. Completed her dialysis treatment in bed.   UF of  0.5 litre  Patient reports no pain this morning.   Objective:  Vital signs in last 24 hours:  Temp:  [97.5 F (36.4 C)-98.9 F (37.2 C)] 98 F (36.7 C) (06/16 0625) Pulse Rate:  [69-94] 91 (06/16 0625) Resp:  [14-30] 17 (06/16 0625) BP: (94-143)/(54-78) 143/58 (06/16 0625) SpO2:  [96 %-100 %] 100 % (06/16 0625) Weight:  [72 kg (158 lb 11.7 oz)] 72 kg (158 lb 11.7 oz) (06/15 1515)  Weight change:  Filed Weights   11/11/16 1510 11/11/16 1812 11/13/16 1515  Weight: 71 kg (156 lb 8.4 oz) 71.2 kg (156 lb 15.5 oz) 72 kg (158 lb 11.7 oz)    Intake/Output: I/O last 3 completed shifts: In: 120 [P.O.:120] Out: 500 [Other:500]   Intake/Output this shift:  No intake/output data recorded.  Physical Exam: General: NAD, laying in bed, frail  Head: Normocephalic, atraumatic. Moist oral mucosal membranes  Eyes: Anicteric, PERRL  Neck: Supple, trachea midline  Lungs:  Clear to auscultation  Heart: Regular rate and rhythm  Abdomen:  Soft, nontender,   Extremities: no peripheral edema.  Neurologic: Nonfocal, moving all four extremities  Skin: No lesions  Access: Right AVF    Basic Metabolic Panel:  Recent Labs Lab 11/11/16 0926 11/12/16 0003 11/13/16 1505 11/14/16 0352  NA 133*  --  132* 135  K 4.5  --  4.7 3.7  CL 93*  --  95* 98*  CO2 20*  --  22 29  GLUCOSE 73  --  180* 150*  BUN 87*  --  52* 22*  CREATININE 8.60*  --  5.85* 3.41*  CALCIUM 7.8*  --  8.7* 8.6*  PHOS 8.1* 3.9 6.1*  --     Liver Function Tests:  Recent Labs Lab 11/11/16 0926 11/13/16 1505  AST 23  --   ALT 8*  --   ALKPHOS 58  --   BILITOT 0.8  --   PROT 6.8  --   ALBUMIN 2.9* 2.8*   No results for input(s): LIPASE, AMYLASE in the last 168 hours. No results for input(s): AMMONIA in the last 168  hours.  CBC:  Recent Labs Lab 11/11/16 0926 11/13/16 1505 11/14/16 0352  WBC 8.4 7.7 6.4  HGB 10.0* 10.3* 10.6*  HCT 30.3* 31.0* 32.1*  MCV 92.9 92.2 92.9  PLT 222 208 199    Cardiac Enzymes: No results for input(s): CKTOTAL, CKMB, CKMBINDEX, TROPONINI in the last 168 hours.  BNP: Invalid input(s): POCBNP  CBG:  Recent Labs Lab 11/13/16 0737 11/13/16 1233 11/13/16 1857 11/13/16 2257 11/14/16 0758  GLUCAP 106* 115* 129* 125* 120*    Microbiology: Results for orders placed or performed during the hospital encounter of 11/11/16  Blood culture (routine x 2)     Status: None (Preliminary result)   Collection Time: 11/11/16  9:45 AM  Result Value Ref Range Status   Specimen Description BLOOD L AC  Final   Special Requests   Final    BOTTLES DRAWN AEROBIC AND ANAEROBIC Blood Culture adequate volume   Culture NO GROWTH 2 DAYS  Final   Report Status PENDING  Incomplete  Blood culture (routine x 2)     Status: None (Preliminary result)   Collection Time: 11/11/16 10:00 AM  Result Value Ref Range Status   Specimen Description BLOOD L FA  Final   Special Requests   Final    BOTTLES DRAWN AEROBIC AND ANAEROBIC Blood Culture adequate volume   Culture NO GROWTH 2 DAYS  Final   Report Status PENDING  Incomplete  MRSA PCR Screening     Status: None   Collection Time: 11/12/16 12:49 AM  Result Value Ref Range Status   MRSA by PCR NEGATIVE NEGATIVE Final    Comment:        The GeneXpert MRSA Assay (FDA approved for NASAL specimens only), is one component of a comprehensive MRSA colonization surveillance program. It is not intended to diagnose MRSA infection nor to guide or monitor treatment for MRSA infections.     Coagulation Studies:  Recent Labs  11/11/16 1333 11/12/16 0003 11/13/16 1400 11/14/16 0352  LABPROT 27.1* 27.9* 28.7* 27.7*  INR 2.46 2.55 2.64 2.53    Urinalysis: No results for input(s): COLORURINE, LABSPEC, PHURINE, GLUCOSEU, HGBUR,  BILIRUBINUR, KETONESUR, PROTEINUR, UROBILINOGEN, NITRITE, LEUKOCYTESUR in the last 72 hours.  Invalid input(s): APPERANCEUR    Imaging: No results found.   Medications:   . vancomycin Stopped (11/13/16 1931)   . calcium acetate  1,334 mg Oral TID WC  . diltiazem  120 mg Oral QHS  . docusate sodium  100 mg Oral BID  . epoetin (EPOGEN/PROCRIT) injection  10,000 Units Intravenous Q M,W,F-HD  . famotidine  10 mg Oral Daily  . fentaNYL  25 mcg Transdermal Q72H  . insulin aspart  0-9 Units Subcutaneous TID WC  . multivitamin  1 tablet Oral Daily  . omega-3 acid ethyl esters  1,000 mg Oral BID  . PARoxetine  40 mg Oral QHS  . pravastatin  20 mg Oral QPM  . warfarin  2.5 mg Oral q1800  . Warfarin - Pharmacist Dosing Inpatient   Does not apply q1800   acetaminophen **OR** acetaminophen, albuterol, cyclobenzaprine, fentaNYL (SUBLIMAZE) injection, ondansetron **OR** ondansetron (ZOFRAN) IV, oxyCODONE, polyethylene glycol, traMADol  Assessment/ Plan:  [name] is a 83 y.o. white female with atrial fibrillation, hypertension, gout, insulin-dependent diabetes mellitus type 2, diabetic neuropathy, CVA with right visual field blindness, hyperlipidemia, major depressive disorder, overactive bladder with incontinence, generalized anxiety disorder, congestive heart failure diastolic, parathyroidectomy, and osteoarthritis. Currently with discitis  MWF [facility] [address]   1. End Stage Renal Disease: MWF. Hemodialysis yesterday in chair - Continue MWF schedule  2. Discitis:  - Appreciate ID input. Neurosurgery consulted 6/14.  - IV vancomycin  3. Hypertension:   - diltiazem  4. Secondary Hyperparathyroidism:   - calcium acetate   5. Anemia of chronic kidney disease: hemoglobin 10.6 - epo with HD treatment.    LOS: 3 [name] 6/16/201811:04 AM

## 2016-11-14 NOTE — Progress Notes (Signed)
Physical Therapy Treatment Patient Details Name: [name] MRN: [id number] DOB: 1934/04/15 Today's Date: 11/14/2016    History of Present Illness 83 yo female with, has discitis L2-3 in lumbar spine, bronchitis vs PNA per imaging report.  PMHx:  CHF, COPD, a-fib, neuropathy, R heel ulcer with varied states of healing over the past few months.      PT Comments    Pt alone in room resting in bed upon arrival talking to "Toniann Fail".  Pt stated "I am hallucinating".  She reports this is common for her when she takes pain medications.  She is unaware where she is asking if she is in "my old home in  [place]".  Pt initially declines therapy this am due to pain but does participate when starting gently with isometric exercises and progresses exercises from there.  She seems generally comfortable when moving RLE but does flinch and c/o more pain with LLE.  She is unable to rate pain.  She agrees to sitting EOB and with slow movements, she is able to get to EOB x 2 with mod/max a x 1.  1st attempt she is able to sit x 1 minute with distraction but on second she quickly states "Lay me back down" and begins to weep.  Overall increased activity tolerance but still remains very limited with poor ability to tolerate sitting activities.   Follow Up Recommendations  SNF;Home health PT     Equipment Recommendations       Recommendations for Other Services       Precautions / Restrictions Precautions Precautions: Fall Restrictions Weight Bearing Restrictions: No    Mobility  Bed Mobility Overal bed mobility: Needs Assistance Bed Mobility: Rolling Rolling: Min assist   Supine to sit: Mod assist;Max assist Sit to supine: Max assist   General bed mobility comments: supine to /from sit on edge of bed x 2  Transfers                 General transfer comment: unable to try secondary to severe pain  Ambulation/Gait                 Stairs            Wheelchair Mobility     Modified Rankin (Stroke Patients Only)       Balance Overall balance assessment: Needs assistance Sitting-balance support: Feet supported;Bilateral upper extremity supported Sitting balance-Leahy Scale: Poor Sitting balance - Comments: Needed min assist to remain upright, unable to reach outside BOS and no attempts to correct imbalances                                    Cognition Arousal/Alertness: Lethargic Behavior During Therapy: WFL for tasks assessed/performed Overall Cognitive Status: No family/caregiver present to determine baseline cognitive functioning                                 General Comments: Pt with c/o hallucinations but does not affect therapy session      Exercises Other Exercises Other Exercises: BLE x 10 AAROM ankle pumps, quad and glut sets, SLR, AB/Adduction, heel slides and SAQ    General Comments        Pertinent Vitals/Pain Pain Assessment: Faces Pain Score: 8  Pain Location: seems generally comfortable at rest but c/o pain with movement Pain Descriptors / Indicators: Crying;Moaning  Pain Intervention(s): Limited activity within patient's tolerance;Monitored during session;Repositioned    Home Living                      Prior Function            PT Goals (current goals can now be found in the care plan section) Progress towards PT goals: Not progressing toward goals - comment    Frequency    Min 2X/week      PT Plan Current plan remains appropriate    Co-evaluation              AM-PAC PT "6 Clicks" Daily Activity  Outcome Measure  Difficulty turning over in bed (including adjusting bedclothes, sheets and blankets)?: Total Difficulty moving from lying on back to sitting on the side of the bed? : Total Difficulty sitting down on and standing up from a chair with arms (e.g., wheelchair, bedside commode, etc,.)?: Total Help needed moving to and from a bed to chair (including a  wheelchair)?: Total Help needed walking in hospital room?: Total Help needed climbing 3-5 steps with a railing? : Total 6 Click Score: 6    End of Session Equipment Utilized During Treatment: Oxygen Activity Tolerance: Patient limited by pain Patient left: in bed;with bed alarm set;with call bell/phone within reach         Time: 0825-0848 PT Time Calculation (min) (ACUTE ONLY): 23 min  Charges:  $Therapeutic Exercise: 8-22 mins $Therapeutic Activity: 8-22 mins                    G Codes:      Danielle DessSarah Ajla Mcgeachy, PTA 11/14/16, 9:00 AM

## 2016-11-14 NOTE — Plan of Care (Signed)
Problem: Education: Goal: Knowledge of [facility] Education information/materials will improve Outcome: Not Progressing Pt confused   

## 2016-11-14 NOTE — Progress Notes (Signed)
ANTICOAGULATION CONSULT NOTE -Follow Up  Pharmacy Consult for Warfarin dosing Indication: atrial fibrillation  Allergies  Allergen Reactions  . Codeine Itching and Other (See Comments)    Reaction:  Confusion/Hallucinations   . Contrast Media [Iodinated Diagnostic Agents] Other (See Comments)    "skin peel" breaks into a blistered rash, then peel. If given Benadryl and solumedrol she will be okay  . Oxycodone Other (See Comments)    Reaction:  Confusion/Hallucinations   . Quinine Derivatives Rash  . Tape Other (See Comments)    Skin is very thin and will blister and pull off skin with any tape except paper tape.  PLEASE USE PAPER TAPE.    Patient Measurements: Height: 5\' 5"  (165.1 cm) Weight: 158 lb 11.7 oz (72 kg) IBW/kg (Calculated) : 57  Vital Signs: Temp: 98 F (36.7 C) (06/16 0625) Temp Source: Oral (06/16 0625) BP: 143/58 (06/16 0625) Pulse Rate: 91 (06/16 0625)  Recent Labs  11/11/16 0926  11/12/16 0003 11/13/16 1400 11/13/16 1505 11/14/16 0352  HGB 10.0*  --   --   --  10.3* 10.6*  HCT 30.3*  --   --   --  31.0* 32.1*  PLT 222  --   --   --  208 199  LABPROT  --   < > 27.9* 28.7*  --  27.7*  INR  --   < > 2.55 2.64  --  2.53  CREATININE 8.60*  --   --   --  5.85* 3.41*  < > = values in this interval not displayed.  Estimated Creatinine Clearance: 12.9 mL/min (A) (by C-G formula based on SCr of 3.41 mg/dL (H)).   Medical History: Past Medical History:  Diagnosis Date  . Anemia   . Anxiety   . Arthritis    gout  . Atrial fibrillation (HCC)   . CHF (congestive heart failure) (HCC)    has had this off and on prior to dialysis but has had several times since  . Chronic kidney disease    end stage renal insufficiency.  on dialysis M,W,F  . Diabetes mellitus without complication (HCC)   . Dialysis patient [facility])    Mon.- Wed- Fri.  . Dysrhythmia    afib/flutter  . GERD (gastroesophageal reflux disease)   . Hypertension   . Neuropathy due to type 2  diabetes mellitus (HCC)   . Peripheral vascular disease (HCC)   . Pleural effusion   . Pulmonary hypertension (HCC)   . Renal insufficiency   . Restless leg syndrome   . Shortness of breath dyspnea    with exertion  . Stroke (HCC) 06/2015   has had 1 other stroke. first one affected vision. the most recent one affected right arm   Assessment: 83 yo female with PMH of A.Fib, admitted with Discitis. Pharmacy consulted for warfarin dosing and monitoring. INR on admission 2.46.  Home Regimen: warfarin 2.5mg  QD  DATE  INR  DOSE 6/13  2.46  2.5mg  6/14  2.55  2.5 mg 6/15  2.64  2.5 mg 6/16                 2.53                 2.5 mg  Goal of Therapy:  INR 2-3 Monitor platelets by anticoagulation protocol: Yes   Plan:  Will continue patient's home dose of Warfarin 2.5mg  daily. Patient has been started on Vancomycin. Will monitor INR daily while on Abx per protocol.  Stormy CardKatsoudas,Genia Perin K, [facility]urrection Medical CenterRPH Clinical Pharmacist 11/14/2016 8:06 AM

## 2016-11-14 NOTE — Progress Notes (Signed)
[employer] - [place] at [facility]   PATIENT NAME: [name]    MR#:  [id number]  DATE OF BIRTH:  [birth year]  SUBJECTIVE:   Still having significant back pain with attempting to get up. Patient's daughter is at bedside. No other acute events overnight.  REVIEW OF SYSTEMS:    Review of Systems  Constitutional: Negative for chills and fever.  HENT: Negative for congestion and tinnitus.   Eyes: Negative for blurred vision and double vision.  Respiratory: Negative for cough, shortness of breath and wheezing.   Cardiovascular: Negative for chest pain, orthopnea and PND.  Gastrointestinal: Negative for abdominal pain, diarrhea, nausea and vomiting.  Genitourinary: Negative for dysuria and hematuria.  Musculoskeletal: Positive for back pain.  Neurological: Negative for dizziness, sensory change and focal weakness.  All other systems reviewed and are negative.   Nutrition: Renal/Carb modified. Tolerating Diet: Yes Tolerating PT: Eval noted.   DRUG ALLERGIES:   Allergies  Allergen Reactions  . Codeine Itching and Other (See Comments)    Reaction:  Confusion/Hallucinations   . Contrast Media [Iodinated Diagnostic Agents] Other (See Comments)    "skin peel" breaks into a blistered rash, then peel. If given Benadryl and solumedrol she will be okay  . Oxycodone Other (See Comments)    Reaction:  Confusion/Hallucinations   . Quinine Derivatives Rash  . Tape Other (See Comments)    Skin is very thin and will blister and pull off skin with any tape except paper tape.  PLEASE USE PAPER TAPE.    VITALS:  Blood pressure (!) 133/55, pulse 83, temperature 97.9 F (36.6 C), temperature source Oral, resp. rate 18, height 5\' 5"  (1.651 m), weight 72 kg (158 lb 11.7 oz), SpO2 99 %.  PHYSICAL EXAMINATION:   Physical Exam  GENERAL:  83 y.o.-year-old patient lying in bed in no acute distress.  EYES: Pupils equal, round, reactive to light and accommodation. No scleral  icterus. Extraocular muscles intact.  HEENT: Head atraumatic, normocephalic. Oropharynx and nasopharynx clear.  NECK:  Supple, no jugular venous distention. No thyroid enlargement, no tenderness.  LUNGS: Normal breath sounds bilaterally, no wheezing, rales, rhonchi. No use of accessory muscles of respiration.  CARDIOVASCULAR: S1, S2 normal. No murmurs, rubs, or gallops.  ABDOMEN: Soft, nontender, nondistended. Bowel sounds present. No organomegaly or mass.  EXTREMITIES: No cyanosis, clubbing or edema b/l.    NEUROLOGIC: Cranial nerves II through XII are intact. No focal Motor or sensory deficits b/l. Globally weak  PSYCHIATRIC: The patient is alert and oriented x 3.  SKIN: No obvious rash, lesion, or ulcer.   Right upper extremity AV fistula with good bruit and thrill.  LABORATORY PANEL:   CBC  Recent Labs Lab 11/14/16 0352  WBC 6.4  HGB 10.6*  HCT 32.1*  PLT 199   ------------------------------------------------------------------------------------------------------------------  Chemistries   Recent Labs Lab 11/11/16 0926  11/14/16 0352  NA 133*  < > 135  K 4.5  < > 3.7  CL 93*  < > 98*  CO2 20*  < > 29  GLUCOSE 73  < > 150*  BUN 87*  < > 22*  CREATININE 8.60*  < > 3.41*  CALCIUM 7.8*  < > 8.6*  AST 23  --   --   ALT 8*  --   --   ALKPHOS 58  --   --   BILITOT 0.8  --   --   < > = values in this interval not displayed. ------------------------------------------------------------------------------------------------------------------  Cardiac Enzymes  No results for input(s): TROPONINI in the last 168 hours. ------------------------------------------------------------------------------------------------------------------  RADIOLOGY:  No results found.   ASSESSMENT AND PLAN:   83 year old female with past medical history of diabetes, end-stage renal disease and hemodialysis, history of discitis, chronic atrial fibrillation, CHF, hypertension, GERD, restless leg  syndrome who presented to the hospital due to worsening back pain.  1. Severe back pain secondary to discitis/osteomyelitis-continue IV vancomycin, patient will need long-term therapy as per infectious disease for about 12 weeks. -Seen by neurosurgery and not a good surgical candidate presently.  -Can consider facet joint injection as an outpatient with pain management.  If pain continues to be an issue -Continue pain control with fentanyl patch, oxycodone/tramadol. Flexeril added by palliative care yesterday.  2. End-stage renal disease on hemodialysis-nephrology following. Continue dialysis on Monday Wednesday Friday. Patient still cannot sit up to get hemodialysis given his significant pack pain. -If this does not improve consider changing pain meds or hoping that patient's antibiotics will improve her symptoms.  3. History of chronic atrial fibrillation-rate controlled. Continue Cardizem, continue warfarin.  4. Diabetes type 2 without complication-continue sliding scale insulin. Blood sugar stable.  5. Secondary hyperparathyroidism-continue PhosLo.  6. Anxiety-continue Paxil.  7. Hyperlipidemia-continue Pravachol.  Likely discharge to SNF once able to sit up for HD.   All the records are reviewed and case discussed with Care Management/Social Worker. Management plans discussed with the patient, family and they are in agreement.  CODE STATUS: DNR  DVT Prophylaxis: Warfarin  TOTAL TIME TAKING CARE OF THIS PATIENT: 30 minutes.   POSSIBLE D/C IN 2-3 DAYS, DEPENDING ON CLINICAL CONDITION.   Houston Siren M.D on 11/14/2016 at 3:13 PM  Between 7am to 6pm - Pager - [phone]  After 6pm go to [email or web address] - Social research officer, government  [facility] [place] [facility]  Office  [phone]  CC: Primary care physician; [name], MD

## 2016-11-15 LAB — GLUCOSE, CAPILLARY
GLUCOSE-CAPILLARY: 158 mg/dL — AB (ref 65–99)
GLUCOSE-CAPILLARY: 137 mg/dL — AB (ref 65–99)
Glucose-Capillary: 91 mg/dL (ref 65–99)
Glucose-Capillary: 154 mg/dL — ABNORMAL HIGH (ref 65–99)

## 2016-11-15 LAB — PROTIME-INR
INR: 2.78
Prothrombin Time: 29.9 seconds — ABNORMAL HIGH (ref 11.4–15.2)

## 2016-11-15 NOTE — Progress Notes (Signed)
ANTICOAGULATION CONSULT NOTE -Follow Up  Pharmacy Consult for Warfarin dosing Indication: atrial fibrillation  Allergies  Allergen Reactions  . Codeine Itching and Other (See Comments)    Reaction:  Confusion/Hallucinations   . Contrast Media [Iodinated Diagnostic Agents] Other (See Comments)    "skin peel" breaks into a blistered rash, then peel. If given Benadryl and solumedrol she will be okay  . Oxycodone Other (See Comments)    Reaction:  Confusion/Hallucinations   . Quinine Derivatives Rash  . Tape Other (See Comments)    Skin is very thin and will blister and pull off skin with any tape except paper tape.  PLEASE USE PAPER TAPE.    Patient Measurements: Height: 5\' 5"  (165.1 cm) Weight: 158 lb 11.7 oz (72 kg) IBW/kg (Calculated) : 57  Vital Signs: Temp: 97.6 F (36.4 C) (06/17 0618) Temp Source: Oral (06/17 0618) BP: 148/64 (06/17 0618) Pulse Rate: 95 (06/17 0619)  Recent Labs  11/13/16 1400 11/13/16 1505 11/14/16 0352 11/15/16 0454  HGB  --  10.3* 10.6*  --   HCT  --  31.0* 32.1*  --   PLT  --  208 199  --   LABPROT 28.7*  --  27.7* 29.9*  INR 2.64  --  2.53 2.78  CREATININE  --  5.85* 3.41*  --     Estimated Creatinine Clearance: 12.9 mL/min (A) (by C-G formula based on SCr of 3.41 mg/dL (H)).   Medical History: Past Medical History:  Diagnosis Date  . Anemia   . Anxiety   . Arthritis    gout  . Atrial fibrillation (HCC)   . CHF (congestive heart failure) (HCC)    has had this off and on prior to dialysis but has had several times since  . Chronic kidney disease    end stage renal insufficiency.  on dialysis M,W,F  . Diabetes mellitus without complication (HCC)   . Dialysis patient [facility](HCC)    Mon.- Wed- Fri.  . Dysrhythmia    afib/flutter  . GERD (gastroesophageal reflux disease)   . Hypertension   . 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 Peripheral vascular disease (HCC)   . Pleural effusion   . Pulmonary hypertension (HCC)    . Renal insufficiency   . Restless leg syndrome   . Shortness of breath dyspnea    with exertion  . Stroke (HCC) 06/2015   has had 1 other stroke. first one affected vision. the most recent one affected right arm   Assessment: 83 yo female with PMH of A.Fib, admitted with Discitis. Pharmacy consulted for warfarin dosing and monitoring. INR on admission 2.46.  Home Regimen: warfarin 2.5mg  QD  DATE  INR  DOSE 6/13  2.46  2.5mg  6/14  2.55  2.5 mg 6/15  2.64  2.5 mg 6/16                 2.53                 2.5 mg 6/17                 2.78                 2.5 mg  Goal of Therapy:  INR 2-3 Monitor platelets by anticoagulation protocol: Yes   Plan:  Will continue patient's home dose of Warfarin 2.5mg  daily. Patient has been started on Vancomycin. Will monitor INR daily while on Abx per protocol.   Deerica Waszak K,  [facility] Clinical Pharmacist 11/15/2016 8:39 AM

## 2016-11-15 NOTE — Progress Notes (Signed)
Per Dr orders, pt up to chair

## 2016-11-15 NOTE — Progress Notes (Signed)
[facility]  ROUNDING NOTE   Subjective:   Patient out of bed into chair this morning. She states her pain is well controlled.   Objective:  Vital signs in last 24 hours:  Temp:  [97.6 F (36.4 C)-98.4 F (36.9 C)] 98.4 F (36.9 C) (06/17 1155) Pulse Rate:  [90-114] 95 (06/17 1155) Resp:  [17] 17 (06/17 1155) BP: (132-151)/(57-64) 151/58 (06/17 1155) SpO2:  [87 %-100 %] 98 % (06/17 1155)  Weight change:  Filed Weights   11/11/16 1510 11/11/16 1812 11/13/16 1515  Weight: 71 kg (156 lb 8.4 oz) 71.2 kg (156 lb 15.5 oz) 72 kg (158 lb 11.7 oz)    Intake/Output: I/O last 3 completed shifts: In: 120 [P.O.:120] Out: 0    Intake/Output this shift:  No intake/output data recorded.  Physical Exam: General: NAD, sitting in chair, frail  Head: Normocephalic, atraumatic. Moist oral mucosal membranes  Eyes: Anicteric, PERRL  Neck: Supple, trachea midline  Lungs:  Clear to auscultation  Heart: Regular rate and rhythm  Abdomen:  Soft, nontender,   Extremities: no peripheral edema.  Neurologic: Nonfocal, moving all four extremities  Skin: No lesions  Access: Right AVF    Basic Metabolic Panel:  Recent Labs Lab 11/11/16 0926 11/12/16 0003 11/13/16 1505 11/14/16 0352  NA 133*  --  132* 135  K 4.5  --  4.7 3.7  CL 93*  --  95* 98*  CO2 20*  --  22 29  GLUCOSE 73  --  180* 150*  BUN 87*  --  52* 22*  CREATININE 8.60*  --  5.85* 3.41*  CALCIUM 7.8*  --  8.7* 8.6*  PHOS 8.1* 3.9 6.1*  --     Liver Function Tests:  Recent Labs Lab 11/11/16 0926 11/13/16 1505  AST 23  --   ALT 8*  --   ALKPHOS 58  --   BILITOT 0.8  --   PROT 6.8  --   ALBUMIN 2.9* 2.8*   No results for input(s): LIPASE, AMYLASE in the last 168 hours. No results for input(s): AMMONIA in the last 168 hours.  CBC:  Recent Labs Lab 11/11/16 0926 11/13/16 1505 11/14/16 0352  WBC 8.4 7.7 6.4  HGB 10.0* 10.3* 10.6*  HCT 30.3* 31.0* 32.1*  MCV 92.9 92.2 92.9  PLT 222 208 199     Cardiac Enzymes: No results for input(s): CKTOTAL, CKMB, CKMBINDEX, TROPONINI in the last 168 hours.  BNP: Invalid input(s): POCBNP  CBG:  Recent Labs Lab 11/14/16 1143 11/14/16 1634 11/14/16 2109 11/15/16 0729 11/15/16 1124  GLUCAP 119* 127* 113* 91 158*    Microbiology: Results for orders placed or performed during the hospital encounter of 11/11/16  Blood culture (routine x 2)     Status: None (Preliminary result)   Collection Time: 11/11/16  9:45 AM  Result Value Ref Range Status   Specimen Description BLOOD L AC  Final   Special Requests   Final    BOTTLES DRAWN AEROBIC AND ANAEROBIC Blood Culture adequate volume   Culture NO GROWTH 2 DAYS  Final   Report Status PENDING  Incomplete  Blood culture (routine x 2)     Status: None (Preliminary result)   Collection Time: 11/11/16 10:00 AM  Result Value Ref Range Status   Specimen Description BLOOD L FA  Final   Special Requests   Final    BOTTLES DRAWN AEROBIC AND ANAEROBIC Blood Culture adequate volume   Culture NO GROWTH 2 DAYS  Final  Report Status PENDING  Incomplete  MRSA PCR Screening     Status: None   Collection Time: 11/12/16 12:49 AM  Result Value Ref Range Status   MRSA by PCR NEGATIVE NEGATIVE Final    Comment:        The GeneXpert MRSA Assay (FDA approved for NASAL specimens only), is one component of a comprehensive MRSA colonization surveillance program. It is not intended to diagnose MRSA infection nor to guide or monitor treatment for MRSA infections.     Coagulation Studies:  Recent Labs  11/13/16 1400 11/14/16 0352 11/15/16 0454  LABPROT 28.7* 27.7* 29.9*  INR 2.64 2.53 2.78    Urinalysis: No results for input(s): COLORURINE, LABSPEC, PHURINE, GLUCOSEU, HGBUR, BILIRUBINUR, KETONESUR, PROTEINUR, UROBILINOGEN, NITRITE, LEUKOCYTESUR in the last 72 hours.  Invalid input(s): APPERANCEUR    Imaging: No results found.   Medications:   . vancomycin Stopped (11/13/16 1931)    . calcium acetate  1,334 mg Oral TID WC  . diltiazem  120 mg Oral QHS  . docusate sodium  100 mg Oral BID  . epoetin (EPOGEN/PROCRIT) injection  10,000 Units Intravenous Q M,W,F-HD  . famotidine  10 mg Oral Daily  . fentaNYL  25 mcg Transdermal Q72H  . insulin aspart  0-9 Units Subcutaneous TID WC  . multivitamin  1 tablet Oral Daily  . omega-3 acid ethyl esters  1,000 mg Oral BID  . PARoxetine  40 mg Oral QHS  . pravastatin  20 mg Oral QPM  . warfarin  2.5 mg Oral q1800  . Warfarin - Pharmacist Dosing Inpatient   Does not apply q1800   acetaminophen **OR** acetaminophen, albuterol, cyclobenzaprine, fentaNYL (SUBLIMAZE) injection, ondansetron **OR** ondansetron (ZOFRAN) IV, oxyCODONE, polyethylene glycol, traMADol  Assessment/ Plan:  [name] is a [age] y.o. white female with atrial fibrillation, hypertension, gout, insulin-dependent diabetes mellitus type 2, diabetic neuropathy, CVA with right visual field blindness, hyperlipidemia, major depressive disorder, overactive bladder with incontinence, generalized anxiety disorder, congestive heart failure diastolic, parathyroidectomy, and osteoarthritis. Currently with discitis  MWF [facility] [address]   1. End Stage Renal Disease: MWF. Hemodialysis. Next treatment will need to be done in chair - Continue MWF schedule. Treatment for tomorrow.   2. Discitis:  - Appreciate ID input. Neurosurgery consulted 6/14.  - IV vancomycin  3. Hypertension:   - diltiazem  4. Secondary Hyperparathyroidism:   - calcium acetate   5. Anemia of chronic kidney disease: hemoglobin 10.6 - epo with HD treatment.    LOS: 4 [name] 6/17/20181:11 PM

## 2016-11-15 NOTE — Progress Notes (Signed)
[employer] - [place] at [facility]   PATIENT NAME: [name]    MR#:  [id number]  DATE OF BIRTH:  Apr 29, 1934  SUBJECTIVE:   Patient says her back pain is better since yesterday. No other acute events or complaints presently. Daughter is at bedside.  REVIEW OF SYSTEMS:    Review of Systems  Constitutional: Negative for chills and fever.  HENT: Negative for congestion and tinnitus.   Eyes: Negative for blurred vision and double vision.  Respiratory: Negative for cough, shortness of breath and wheezing.   Cardiovascular: Negative for chest pain, orthopnea and PND.  Gastrointestinal: Negative for abdominal pain, diarrhea, nausea and vomiting.  Genitourinary: Negative for dysuria and hematuria.  Musculoskeletal: Positive for back pain.  Neurological: Negative for dizziness, sensory change and focal weakness.  All other systems reviewed and are negative.   Nutrition: Renal/Carb modified. Tolerating Diet: Yes Tolerating PT: Eval noted.   DRUG ALLERGIES:   Allergies  Allergen Reactions  . Codeine Itching and Other (See Comments)    Reaction:  Confusion/Hallucinations   . Contrast Media [Iodinated Diagnostic Agents] Other (See Comments)    "skin peel" breaks into a blistered rash, then peel. If given Benadryl and solumedrol she will be okay  . Oxycodone Other (See Comments)    Reaction:  Confusion/Hallucinations   . Quinine Derivatives Rash  . Tape Other (See Comments)    Skin is very thin and will blister and pull off skin with any tape except paper tape.  PLEASE USE PAPER TAPE.    VITALS:  Blood pressure (!) 151/58, pulse 95, temperature 98.4 F (36.9 C), temperature source Oral, resp. rate 17, height 5\' 5"  (1.651 m), weight 72 kg (158 lb 11.7 oz), SpO2 98 %.  PHYSICAL EXAMINATION:   Physical Exam  GENERAL:  83 y.o.-year-old patient lying in bed in no acute distress.  EYES: Pupils equal, round, reactive to light and accommodation. No scleral  icterus. Extraocular muscles intact.  HEENT: Head atraumatic, normocephalic. Oropharynx and nasopharynx clear.  NECK:  Supple, no jugular venous distention. No thyroid enlargement, no tenderness.  LUNGS: Normal breath sounds bilaterally, no wheezing, rales, rhonchi. No use of accessory muscles of respiration.  CARDIOVASCULAR: S1, S2 normal. No murmurs, rubs, or gallops.  ABDOMEN: Soft, nontender, nondistended. Bowel sounds present. No organomegaly or mass.  EXTREMITIES: No cyanosis, clubbing or edema b/l.    NEUROLOGIC: Cranial nerves II through XII are intact. No focal Motor or sensory deficits b/l. Globally weak  PSYCHIATRIC: The patient is alert and oriented x 3.  SKIN: No obvious rash, lesion, or ulcer.   Right upper extremity AV fistula with good bruit and thrill.  LABORATORY PANEL:   CBC  Recent Labs Lab 11/14/16 0352  WBC 6.4  HGB 10.6*  HCT 32.1*  PLT 199   ------------------------------------------------------------------------------------------------------------------  Chemistries   Recent Labs Lab 11/11/16 0926  11/14/16 0352  NA 133*  < > 135  K 4.5  < > 3.7  CL 93*  < > 98*  CO2 20*  < > 29  GLUCOSE 73  < > 150*  BUN 87*  < > 22*  CREATININE 8.60*  < > 3.41*  CALCIUM 7.8*  < > 8.6*  AST 23  --   --   ALT 8*  --   --   ALKPHOS 58  --   --   BILITOT 0.8  --   --   < > = values in this interval not displayed. ------------------------------------------------------------------------------------------------------------------  Cardiac Enzymes  No results for input(s): TROPONINI in the last 168 hours. ------------------------------------------------------------------------------------------------------------------  RADIOLOGY:  No results found.   ASSESSMENT AND PLAN:   83 year old female with past medical history of diabetes, end-stage renal disease and hemodialysis, history of discitis, chronic atrial fibrillation, CHF, hypertension, GERD, restless leg  syndrome who presented to the hospital due to worsening back pain.  1. Severe back pain secondary to discitis/osteomyelitis-continue IV vancomycin, patient will need long-term therapy as per infectious disease for about 12 weeks. -Seen by neurosurgery and not a good surgical candidate presently.  -Can consider facet joint injection as an outpatient with pain management, If pain continues to be an issue -Continue pain control with fentanyl patch, oxycodone/tramadol, Flexeril.  Pain has improved since yesterday.  2. End-stage renal disease on hemodialysis-nephrology following. Continue dialysis on Monday Wednesday Friday. Patient still cannot sit up to get hemodialysis given her significant pack pain. - plan to pre-medicate with pain meds prior to HD tomorrow to see if she can sit up with HD.  If she can then likely discharge to SNF soon.   3. History of chronic atrial fibrillation-rate controlled. Continue Cardizem, continue warfarin.  4. Diabetes type 2 without complication-continue sliding scale insulin. Blood sugar stable.  5. Secondary hyperparathyroidism-continue PhosLo.  6. Anxiety-continue Paxil.  7. Hyperlipidemia-continue Pravachol.  Likely discharge to SNF once able to sit up for HD.   All the records are reviewed and case discussed with Care Management/Social Worker. Management plans discussed with the patient, family and they are in agreement.  CODE STATUS: DNR  DVT Prophylaxis: Warfarin  TOTAL TIME TAKING CARE OF THIS PATIENT: 25 minutes.   POSSIBLE D/C IN 1-2 DAYS, DEPENDING ON CLINICAL CONDITION.   Houston Siren M.D on 11/15/2016 at 1:16 PM  Between 7am to 6pm - Pager - [phone]  After 6pm go to [email or web address] - Social research officer, government  [facility] [place] [facility]  Office  [phone]  CC: Primary care physician; [name], MD

## 2016-11-16 DIAGNOSIS — Z992 Dependence on renal dialysis: Secondary | ICD-10-CM

## 2016-11-16 DIAGNOSIS — N186 End stage renal disease: Secondary | ICD-10-CM

## 2016-11-16 LAB — GLUCOSE, CAPILLARY
GLUCOSE-CAPILLARY: 118 mg/dL — AB (ref 65–99)
GLUCOSE-CAPILLARY: 150 mg/dL — AB (ref 65–99)
Glucose-Capillary: 120 mg/dL — ABNORMAL HIGH (ref 65–99)
Glucose-Capillary: 138 mg/dL — ABNORMAL HIGH (ref 65–99)

## 2016-11-16 LAB — RENAL FUNCTION PANEL
Albumin: 2.8 g/dL — ABNORMAL LOW (ref 3.5–5.0)
Anion gap: 11 (ref 5–15)
BUN: 43 mg/dL — AB (ref 6–20)
CHLORIDE: 95 mmol/L — AB (ref 101–111)
CO2: 24 mmol/L (ref 22–32)
CREATININE: 5.67 mg/dL — AB (ref 0.44–1.00)
Calcium: 9 mg/dL (ref 8.9–10.3)
GFR calc non Af Amer: 6 mL/min — ABNORMAL LOW (ref >60)
GFR, EST AFRICAN AMERICAN: 7 mL/min — AB (ref >60)
GLUCOSE: 136 mg/dL — AB (ref 65–99)
Phosphorus: 4.3 mg/dL (ref 2.5–4.6)
Potassium: 4.5 mmol/L (ref 3.5–5.1)
Sodium: 130 mmol/L — ABNORMAL LOW (ref 135–145)

## 2016-11-16 LAB — CBC
HCT: 31 % — ABNORMAL LOW (ref 35.0–47.0)
Hemoglobin: 10.3 g/dL — ABNORMAL LOW (ref 12.0–16.0)
MCH: 30.8 pg (ref 26.0–34.0)
MCHC: 33.1 g/dL (ref 32.0–36.0)
MCV: 93.2 fL (ref 80.0–100.0)
PLATELETS: 224 10*3/uL (ref 150–440)
RBC: 3.33 MIL/uL — AB (ref 3.80–5.20)
RDW: 17.9 % — ABNORMAL HIGH (ref 11.5–14.5)
WBC: 8.5 10*3/uL (ref 3.6–11.0)

## 2016-11-16 LAB — CULTURE, BLOOD (ROUTINE X 2)
CULTURE: NO GROWTH
Culture: NO GROWTH
SPECIAL REQUESTS: ADEQUATE
Special Requests: ADEQUATE

## 2016-11-16 LAB — PROTIME-INR
INR: 3.16
Prothrombin Time: 33.1 seconds — ABNORMAL HIGH (ref 11.4–15.2)

## 2016-11-16 LAB — VANCOMYCIN, RANDOM: VANCOMYCIN RM: 15

## 2016-11-16 MED ORDER — WARFARIN SODIUM 2 MG PO TABS
2.0000 mg | ORAL_TABLET | Freq: Every day | ORAL | Status: DC
  Administered 2016-11-17: 2 mg via ORAL
  Filled 2016-11-16 (×2): qty 1

## 2016-11-16 NOTE — Progress Notes (Signed)
  End of hd 

## 2016-11-16 NOTE — Progress Notes (Signed)
Post hd vitals 

## 2016-11-16 NOTE — Progress Notes (Signed)
OT Cancellation Note  Patient Details Name: [name] MRN: [id number] DOB: 05-21-1934   Cancelled Treatment:    Reason Eval/Treat Not Completed: Patient at procedure or test/ unavailable. Pt off floor for dialysis upon attempt to treat.  Will re-attempt OT treatment at a later date/time.   Richrd PrimeJamie Stiller, MPH, MS, OTR/L ascom [phone] 11/16/16, 12:51 PM

## 2016-11-16 NOTE — Progress Notes (Signed)
Pharmacy Antibiotic Note  [name] is a 83 y.o. female admitted on 11/11/2016 with Discitis.  Pharmacy has been consulted for vancomcyin dosing. Patient has ESRD and receives HD M,W,F. Patient received vancomycin 1g IV in ED x 1 dose.   Plan: Vancomycin level 15 mcg/mL x 1. Continue current regimen. Will recheck vancomycin level weekly before dialysis.   Height: 5\' 5"  (165.1 cm) Weight: 163 lb 2.3 oz (74 kg) IBW/kg (Calculated) : 57  Temp (24hrs), Avg:98 F (36.7 C), Min:97.6 F (36.4 C), Max:98.4 F (36.9 C)   Recent Labs Lab 11/11/16 0926 11/13/16 1505 11/14/16 0352 11/16/16 0759 11/16/16 0928  WBC 8.4 7.7 6.4  --  8.5  CREATININE 8.60* 5.85* 3.41*  --   --   VANCORANDOM  --   --   --  15  --     Estimated Creatinine Clearance: 13 mL/min (A) (by C-G formula based on SCr of 3.41 mg/dL (H)).    Allergies  Allergen Reactions  . Codeine Itching and Other (See Comments)    Reaction:  Confusion/Hallucinations   . Contrast Media [Iodinated Diagnostic Agents] Other (See Comments)    "skin peel" breaks into a blistered rash, then peel. If given Benadryl and solumedrol she will be okay  . Oxycodone Other (See Comments)    Reaction:  Confusion/Hallucinations   . Quinine Derivatives Rash  . Tape Other (See Comments)    Skin is very thin and will blister and pull off skin with any tape except paper tape.  PLEASE USE PAPER TAPE.    Antimicrobials this admission: 6/13 vancomycin>>   Dose adjustments this admission:  Microbiology results: 6/13  BCx:   6/13 MRSA PCR:  Thank you for allowing pharmacy to be a part of this patient's care.  Carola FrostNathan A Mykeal Carrick, PharmD, BCPS Clinical Pharmacist 11/16/2016 10:27 AM

## 2016-11-16 NOTE — Progress Notes (Signed)
OT Cancellation Note  Patient Details Name: [name] MRN: [id number] DOB: 1934/01/21   Cancelled Treatment:    Reason Eval/Treat Not Completed: Other (comment). On second attempt to see, pt back in room, meeting with physician. Will re-attempt OT treatment session at later date/time as schedule allows.  Richrd PrimeJamie Stiller, MPH, MS, OTR/L ascom [phone] 11/16/16, 1:41 PM

## 2016-11-16 NOTE — Progress Notes (Signed)
Pre hd assessment  

## 2016-11-16 NOTE — Progress Notes (Signed)
Post hd assessment 

## 2016-11-16 NOTE — Progress Notes (Signed)
Hd start 

## 2016-11-16 NOTE — Progress Notes (Signed)
Patient ID: [name], female   DOB: 1933-06-14, 83 y.o.   MRN: [id number]  This NP visited patient at the bedside as a follow up for palliative needs.  She continues to have significant back pain and was unable to tolerate sitting up in chair for dialysis.   Discussed with nursing importance of pain assessment, patient has had two doses of Oxycodone 10 mg IR for breakthrough pain in past 14 hours.    Lumbar Spinal Othotic as recommended by neurosurgery/11-12-16 not initiated as to date,  will write referral  today. This may really help with her pain management  Plan.   No neuro-surgical intervention recommended.  Will need continued pain management assessment and adjustment.   Patient has had continued physical and functional decline  Over the past year 2/2 to multiple co-morbidites (ESRD on dialysis, recurrent osteomyelitis/discitis,CVA, CHF, failure to thrive)  Will need continued discussion with patient and her family along with  medical providers regarding overall plan of care and treatment options and expected outcomes, ensuring decisions are within the context of the patients values and GOCs.  This is a difficult situation, patient is high risk for decompensation.   Discussed with Dr Peggye FormFitsgerald and Dr Cherlynn KaiserSainani Time in  1600         Time out    1630   Total time spent on the unit was 30 min  Greater than 50% of the time was spent in counseling and coordination of care  Natalie CreedMary Malone Vanblarcom NP  Palliative Medicine Team Team Phone # [phone] Pager [phone]

## 2016-11-16 NOTE — Progress Notes (Signed)
Physical Therapy Treatment Patient Details Name: [name] MRN: [id number] DOB: 1934/01/11 Today's Date: 11/16/2016    History of Present Illness 83 yo female with, has discitis L2-3 in lumbar spine, bronchitis vs PNA per imaging report.  PMHx:  CHF, COPD, a-fib, neuropathy, R heel ulcer with varied states of healing over the past few months.      PT Comments    Pt agreeable to try PT. Pt reports significant back pain; pt noted to be in bed with upper and lower bed significantly raised (pt in clam shell type position). Pain with lowering lower extremities a little at a time. Pt is not very engaged in treatment session requiring heavy cueing and assist throughout. Max A for bed mobility and Min A for maintaining upright posture once attained. Pt does not follow instructions well to engage in exercises/balance activities. Encouraged greater participation with up in bed and exercises to improve strength and balance to allow for transfer to a chair once again. Continue PT to progress above and improve function. Recommendation is discharge to skilled nursing facility due to decline in baseline function.    Follow Up Recommendations  SNF     Equipment Recommendations       Recommendations for Other Services       Precautions / Restrictions Precautions Precautions: Fall Restrictions Weight Bearing Restrictions: No    Mobility  Bed Mobility Overal bed mobility: Needs Assistance Bed Mobility: Supine to Sit;Sit to Supine     Supine to sit: Max assist Sit to supine: Max assist   General bed mobility comments: Pt resists movement more than assist; requires increased cueing/assist and increased time  Transfers                 General transfer comment: Unable at this time; pt does not keep feet on the ground throughout session despite cues, assist and pillow placed under feet. Pt draws them up challenging balance furhter.   Ambulation/Gait                 Stairs             Wheelchair Mobility    Modified Rankin (Stroke Patients Only)       Balance Overall balance assessment: Needs assistance Sitting-balance support: Feet supported;Bilateral upper extremity supported Sitting balance-Leahy Scale: Poor Sitting balance - Comments: Requires assist off and on throughout with posterolateral fall/lean to the L.                                     Cognition Arousal/Alertness: Suspect due to medications Behavior During Therapy: Restless;Anxious (occasionally tearful) Overall Cognitive Status: Impaired/Different from baseline Area of Impairment: Attention;Memory;Following commands;Safety/judgement;Awareness;Problem solving;Orientation                 Orientation Level: Place;Time;Situation Current Attention Level: Selective Memory: Decreased recall of precautions;Decreased short-term memory Following Commands: Follows one step commands inconsistently Safety/Judgement: Decreased awareness of safety;Decreased awareness of deficits   Problem Solving: Slow processing;Decreased initiation;Difficulty sequencing;Requires verbal cues;Requires tactile cues General Comments: According to daughter, pt normally coversational, aware and follows instructions very well. Pt tearful several times asking to not do so much with sitting to the EOB      Exercises Other Exercises Other Exercises: Attempted LE exercises with very little follow through Other Exercises: Verbal and tactile cues/assist for upright seated balance. attempted fwd reaches, L upward reach and righting    General  Comments        Pertinent Vitals/Pain Pain Assessment: Faces Faces Pain Scale: Hurts whole lot Pain Location: Back (no further description or location) Pain Intervention(s): Monitored during session;Premedicated before session;Repositioned    Home Living                      Prior Function            PT Goals (current goals can now be  found in the care plan section) Progress towards PT goals: Not progressing toward goals - comment    Frequency    Min 2X/week      PT Plan Discharge plan needs to be updated    Co-evaluation              AM-PAC PT "6 Clicks" Daily Activity  Outcome Measure  Difficulty turning over in bed (including adjusting bedclothes, sheets and blankets)?: Total Difficulty moving from lying on back to sitting on the side of the bed? : Total Difficulty sitting down on and standing up from a chair with arms (e.g., wheelchair, bedside commode, etc,.)?: Total Help needed moving to and from a bed to chair (including a wheelchair)?: Total Help needed walking in hospital room?: Total Help needed climbing 3-5 steps with a railing? : Total 6 Click Score: 6    End of Session Equipment Utilized During Treatment: Oxygen Activity Tolerance: Treatment limited secondary to agitation;Other (comment) (cognition) Patient left: in bed;with call bell/phone within reach;with bed alarm set;with family/visitor present   PT Visit Diagnosis: Muscle weakness (generalized) (M62.81);Pain;Difficulty in walking, not elsewhere classified (R26.2)     Time: [id number] PT Time Calculation (min) (ACUTE ONLY): 28 min  Charges:  $Therapeutic Activity: 8-22 mins $Neuromuscular Re-education: 8-22 mins                    G Codes:        Scot DockHeidi E Travoris Bushey, PTA 11/16/2016, 3:36 PM

## 2016-11-16 NOTE — Progress Notes (Signed)
[facility]  ROUNDING NOTE   Subjective:   Patient was NOT dialyzed in chair today because of pain. She could not transfer   HEMODIALYSIS FLOWSHEET:  Blood Flow Rate (mL/min): 350 mL/min Arterial Pressure (mmHg): -110 mmHg Venous Pressure (mmHg): 180 mmHg Transmembrane Pressure (mmHg): 60 mmHg Ultrafiltration Rate (mL/min): 500 mL/min Dialysate Flow Rate (mL/min): 600 ml/min Conductivity: Machine : 13.8 Conductivity: Machine : 13.8 Dialysis Fluid Bolus: Normal Saline Bolus Amount (mL): 250 mL Dialysate Change:  (3k)     Objective:  Vital signs in last 24 hours:  Temp:  [97.6 F (36.4 C)-98.2 F (36.8 C)] 98.1 F (36.7 C) (06/18 1323) Pulse Rate:  [75-97] 77 (06/18 1323) Resp:  [17-23] 17 (06/18 1323) BP: (119-157)/(53-95) 139/60 (06/18 1323) SpO2:  [91 %-100 %] 99 % (06/18 1323) Weight:  [74 kg (163 lb 2.3 oz)] 74 kg (163 lb 2.3 oz) (06/18 0920)  Weight change:  Filed Weights   11/11/16 1812 11/13/16 1515 11/16/16 0920  Weight: 71.2 kg (156 lb 15.5 oz) 72 kg (158 lb 11.7 oz) 74 kg (163 lb 2.3 oz)    Intake/Output: No intake/output data recorded.   Intake/Output this shift:  Total I/O In: -  Out: 1000 [Other:1000]  Physical Exam: General: NAD,   Head: Normocephalic, atraumatic. Moist oral mucosal membranes  Eyes: Anicteric,  Neck: Supple,   Lungs:  Clear to auscultation  Heart: Irregular rhythm  Abdomen:  Soft, nontender,   Extremities: no peripheral edema.  Neurologic: Nonfocal, moving all four extremities  Skin: Scattered eccymosis  Access: Right AVF    Basic Metabolic Panel:  Recent Labs Lab 11/11/16 0926 11/12/16 0003 11/13/16 1505 11/14/16 0352 11/16/16 0928  NA 133*  --  132* 135 130*  K 4.5  --  4.7 3.7 4.5  CL 93*  --  95* 98* 95*  CO2 20*  --  22 29 24   GLUCOSE 73  --  180* 150* 136*  BUN 87*  --  52* 22* 43*  CREATININE 8.60*  --  5.85* 3.41* 5.67*  CALCIUM 7.8*  --  8.7* 8.6* 9.0  PHOS 8.1* 3.9 6.1*  --  4.3     Liver Function Tests:  Recent Labs Lab 11/11/16 0926 11/13/16 1505 11/16/16 0928  AST 23  --   --   ALT 8*  --   --   ALKPHOS 58  --   --   BILITOT 0.8  --   --   PROT 6.8  --   --   ALBUMIN 2.9* 2.8* 2.8*   No results for input(s): LIPASE, AMYLASE in the last 168 hours. No results for input(s): AMMONIA in the last 168 hours.  CBC:  Recent Labs Lab 11/11/16 0926 11/13/16 1505 11/14/16 0352 11/16/16 0928  WBC 8.4 7.7 6.4 8.5  HGB 10.0* 10.3* 10.6* 10.3*  HCT 30.3* 31.0* 32.1* 31.0*  MCV 92.9 92.2 92.9 93.2  PLT 222 208 199 224    Cardiac Enzymes: No results for input(s): CKTOTAL, CKMB, CKMBINDEX, TROPONINI in the last 168 hours.  BNP: Invalid input(s): POCBNP  CBG:  Recent Labs Lab 11/15/16 1124 11/15/16 1706 11/15/16 2108 11/16/16 0735 11/16/16 1345  GLUCAP 158* 137* 154* 118* 120*    Microbiology: Results for orders placed or performed during the hospital encounter of 11/11/16  Blood culture (routine x 2)     Status: None   Collection Time: 11/11/16  9:45 AM  Result Value Ref Range Status   Specimen Description BLOOD L AC  Final  Special Requests   Final    BOTTLES DRAWN AEROBIC AND ANAEROBIC Blood Culture adequate volume   Culture NO GROWTH 5 DAYS  Final   Report Status 11/16/2016 FINAL  Final  Blood culture (routine x 2)     Status: None   Collection Time: 11/11/16 10:00 AM  Result Value Ref Range Status   Specimen Description BLOOD L FA  Final   Special Requests   Final    BOTTLES DRAWN AEROBIC AND ANAEROBIC Blood Culture adequate volume   Culture NO GROWTH 5 DAYS  Final   Report Status 11/16/2016 FINAL  Final  MRSA PCR Screening     Status: None   Collection Time: 11/12/16 12:49 AM  Result Value Ref Range Status   MRSA by PCR NEGATIVE NEGATIVE Final    Comment:        The GeneXpert MRSA Assay (FDA approved for NASAL specimens only), is one component of a comprehensive MRSA colonization surveillance program. It is not intended  to diagnose MRSA infection nor to guide or monitor treatment for MRSA infections.     Coagulation Studies:  Recent Labs  11/14/16 0352 11/15/16 0454 11/16/16 0759  LABPROT 27.7* 29.9* 33.1*  INR 2.53 2.78 3.16    Urinalysis: No results for input(s): COLORURINE, LABSPEC, PHURINE, GLUCOSEU, HGBUR, BILIRUBINUR, KETONESUR, PROTEINUR, UROBILINOGEN, NITRITE, LEUKOCYTESUR in the last 72 hours.  Invalid input(s): APPERANCEUR    Imaging: No results found.   Medications:   . vancomycin 750 mg (11/16/16 1433)   . calcium acetate  1,334 mg Oral TID WC  . diltiazem  120 mg Oral QHS  . docusate sodium  100 mg Oral BID  . epoetin (EPOGEN/PROCRIT) injection  10,000 Units Intravenous Q M,W,F-HD  . famotidine  10 mg Oral Daily  . fentaNYL  25 mcg Transdermal Q72H  . insulin aspart  0-9 Units Subcutaneous TID WC  . multivitamin  1 tablet Oral Daily  . omega-3 acid ethyl esters  1,000 mg Oral BID  . PARoxetine  40 mg Oral QHS  . pravastatin  20 mg Oral QPM  . warfarin  2 mg Oral q1800  . Warfarin - Pharmacist Dosing Inpatient   Does not apply q1800   acetaminophen **OR** acetaminophen, albuterol, cyclobenzaprine, ondansetron **OR** ondansetron (ZOFRAN) IV, oxyCODONE, polyethylene glycol, traMADol  Assessment/ Plan:  [name] is a 83 y.o. white female with atrial fibrillation, hypertension, gout, insulin-dependent diabetes mellitus type 2, diabetic neuropathy, CVA with right visual field blindness, hyperlipidemia, major depressive disorder, overactive bladder with incontinence, generalized anxiety disorder, congestive heart failure diastolic, parathyroidectomy, and osteoarthritis. Currently with discitis  MWF [facility] [address]   1. End Stage Renal Disease: MWF. Hemodialysis. Next treatment will need to be done in chair - Continue MWF schedule. Try to sit in chair tomorrow - Next HD on Wednesday  2. Discitis :  - Appreciate ID input. Neurosurgery consulted 6/14.  - not a surgical candidate - IV vancomycin restarted - Consider Lidocaine patch for local anesthetic effect  3. Secondary Hyperparathyroidism:   - calcium acetate   4. Anemia of chronic kidney disease: hemoglobin 10.3 - epo with HD treatment.    LOS: 5 [name] 6/18/20183:57 PM

## 2016-11-16 NOTE — Clinical Social Work Note (Signed)
Patient continues to not be able to sit up for dialysis at this time and thus is not able to discharge until she can. [name] MSW,LCSW [phone]

## 2016-11-16 NOTE — Progress Notes (Signed)
PT Cancellation Note  Patient Details Name: [name] MRN: [id number] DOB: 09-Jun-1933   Cancelled Treatment:    Reason Eval/Treat Not Completed: Patient at procedure or test/unavailable.  Pt currently off floor for dialysis.  Will re-attempt PT treatment at a later date/time.  Hendricks LimesEmily Dream Harman, PT 11/16/16, 11:46 AM [phone]

## 2016-11-16 NOTE — Progress Notes (Signed)
Chaplain made a follow up visit with pt. Pt was sitting on the chair waiting to be discharged. Pt stated she had lost her voice but was feeling better and pleased she is going back home today. CH provided spiritual care and presence.

## 2016-11-16 NOTE — Progress Notes (Signed)
Pre hd info 

## 2016-11-16 NOTE — Progress Notes (Signed)
[facility] [name] [facility]   PATIENT NAME: [name]    MR#:  [id number]  DATE OF BIRTH:  01/16/1934  SUBJECTIVE:   Patient could not sit up for dialysis today and is still having significant back pain. A little bit confused this afternoon after getting some oxycodone this morning prior to hemodialysis. No other acute complaints or events overnight.  REVIEW OF SYSTEMS:    Review of Systems  Constitutional: Negative for chills and fever.  HENT: Negative for congestion and tinnitus.   Eyes: Negative for blurred vision and double vision.  Respiratory: Negative for cough, shortness of breath and wheezing.   Cardiovascular: Negative for chest pain, orthopnea and PND.  Gastrointestinal: Negative for abdominal pain, diarrhea, nausea and vomiting.  Genitourinary: Negative for dysuria and hematuria.  Musculoskeletal: Positive for back pain.  Neurological: Negative for dizziness, sensory change and focal weakness.  All other systems reviewed and are negative.   Nutrition: Renal/Carb modified. Tolerating Diet: Yes Tolerating PT: Eval noted.   DRUG ALLERGIES:   Allergies  Allergen Reactions  . Codeine Itching and Other (See Comments)    Reaction:  Confusion/Hallucinations   . Contrast Media [Iodinated Diagnostic Agents] Other (See Comments)    "skin peel" breaks into a blistered rash, then peel. If given Benadryl and solumedrol she will be okay  . Oxycodone Other (See Comments)    Reaction:  Confusion/Hallucinations   . Quinine Derivatives Rash  . Tape Other (See Comments)    Skin is very thin and will blister and pull off skin with any tape except paper tape.  PLEASE USE PAPER TAPE.    VITALS:  Blood pressure 139/60, pulse 77, temperature 98.1 F (36.7 C), temperature source Oral, resp. rate 17, height 5\' 5"  (1.651 m), weight 74 kg (163 lb 2.3 oz), SpO2 99 %.  PHYSICAL EXAMINATION:   Physical Exam  GENERAL:  83 y.o.-year-old patient lying in bed  in no acute distress.  EYES: Pupils equal, round, reactive to light and accommodation. No scleral icterus. Extraocular muscles intact.  HEENT: Head atraumatic, normocephalic. Oropharynx and nasopharynx clear.  NECK:  Supple, no jugular venous distention. No thyroid enlargement, no tenderness.  LUNGS: Normal breath sounds bilaterally, no wheezing, rales, rhonchi. No use of accessory muscles of respiration.  CARDIOVASCULAR: S1, S2 normal. No murmurs, rubs, or gallops.  ABDOMEN: Soft, nontender, nondistended. Bowel sounds present. No organomegaly or mass.  EXTREMITIES: No cyanosis, clubbing or edema b/l.    NEUROLOGIC: Cranial nerves II through XII are intact. No focal Motor or sensory deficits b/l. Globally weak  PSYCHIATRIC: The patient is alert and oriented x 3.  SKIN: No obvious rash, lesion, or ulcer.   Right upper extremity AV fistula with good bruit and thrill.  LABORATORY PANEL:   CBC  Recent Labs Lab 11/16/16 0928  WBC 8.5  HGB 10.3*  HCT 31.0*  PLT 224   ------------------------------------------------------------------------------------------------------------------  Chemistries   Recent Labs Lab 11/11/16 0926  11/16/16 0928  NA 133*  < > 130*  K 4.5  < > 4.5  CL 93*  < > 95*  CO2 20*  < > 24  GLUCOSE 73  < > 136*  BUN 87*  < > 43*  CREATININE 8.60*  < > 5.67*  CALCIUM 7.8*  < > 9.0  AST 23  --   --   ALT 8*  --   --   ALKPHOS 58  --   --   BILITOT 0.8  --   --   < > =  values in this interval not displayed. ------------------------------------------------------------------------------------------------------------------  Cardiac Enzymes No results for input(s): TROPONINI in the last 168 hours. ------------------------------------------------------------------------------------------------------------------  RADIOLOGY:  No results found.   ASSESSMENT AND PLAN:   83 year old female with past medical history of diabetes, end-stage renal disease and  hemodialysis, history of discitis, chronic atrial fibrillation, CHF, hypertension, GERD, restless leg syndrome who presented to the hospital due to worsening back pain.  1. Severe back pain secondary to discitis/osteomyelitis-continue IV vancomycin, patient will need long-term therapy as per infectious disease for about 12 weeks. -Seen by neurosurgery and not a good surgical candidate presently.  -Can consider facet joint injection as an outpatient with pain management, If pain continues to be an issue -Continue pain control with fentanyl patch, oxycodone/tramadol, Flexeril.  Still having some pain but cannot increase pain meds as pt. Is already somewhat delirious due to narcotics.   2. End-stage renal disease on hemodialysis-nephrology following. Continue dialysis on Monday Wednesday Friday. Patient still cannot sit up to get hemodialysis given her significant pack pain.  3. History of chronic atrial fibrillation-rate controlled. Continue Cardizem, continue warfarin.  4. Diabetes type 2 without complication-continue sliding scale insulin. Blood sugar stable.  5. Secondary hyperparathyroidism-continue PhosLo.  6. Anxiety-continue Paxil.  7. Hyperlipidemia-continue Pravachol.  ?? LTACH placement and discussed w/ Social Work today.   All the records are reviewed and case discussed with Care Management/Social Worker. Management plans discussed with the patient, family and they are in agreement.  CODE STATUS: DNR  DVT Prophylaxis: Warfarin  TOTAL TIME TAKING CARE OF THIS PATIENT: 25 minutes.   POSSIBLE D/C IN 1-2 DAYS, DEPENDING ON CLINICAL CONDITION.   Houston Siren M.D on 11/16/2016 at 2:44 PM  Between 7am to 6pm - Pager - [phone]  After 6pm go to [email or web address] - Social research officer, government  [facility]  Office  [phone]  CC: Primary care physician; [name], MD

## 2016-11-16 NOTE — Progress Notes (Signed)
ANTICOAGULATION CONSULT NOTE -Follow Up  Pharmacy Consult for Warfarin dosing Indication: atrial fibrillation  Allergies  Allergen Reactions  . Codeine Itching and Other (See Comments)    Reaction:  Confusion/Hallucinations   . Contrast Media [Iodinated Diagnostic Agents] Other (See Comments)    "skin peel" breaks into a blistered rash, then peel. If given Benadryl and solumedrol she will be okay  . Oxycodone Other (See Comments)    Reaction:  Confusion/Hallucinations   . Quinine Derivatives Rash  . Tape Other (See Comments)    Skin is very thin and will blister and pull off skin with any tape except paper tape.  PLEASE USE PAPER TAPE.    Patient Measurements: Height: 5\' 5"  (165.1 cm) Weight: 163 lb 2.3 oz (74 kg) IBW/kg (Calculated) : 57  Vital Signs: Temp: 97.6 F (36.4 C) (06/18 0920) Temp Source: Oral (06/18 0920) BP: 136/65 (06/18 0955) Pulse Rate: 77 (06/18 0955)  Recent Labs  11/13/16 1505 11/14/16 0352 11/15/16 0454 11/16/16 0759 11/16/16 0928  HGB 10.3* 10.6*  --   --  10.3*  HCT 31.0* 32.1*  --   --  31.0*  PLT 208 199  --   --  224  LABPROT  --  27.7* 29.9* 33.1*  --   INR  --  2.53 2.78 3.16  --   CREATININE 5.85* 3.41*  --   --   --     Estimated Creatinine Clearance: 13 mL/min (A) (by C-G formula based on SCr of 3.41 mg/dL (H)).   Medical History: Past Medical History:  Diagnosis Date  . Anemia   . Anxiety   . Arthritis    gout  . Atrial fibrillation (HCC)   . CHF (congestive heart failure) (HCC)    has had this off and on prior to dialysis but has had several times since  . Chronic kidney disease    end stage renal insufficiency.  on dialysis M,W,F  . Diabetes mellitus without complication (HCC)   . Dialysis patient [facility](HCC)    Mon.- Wed- Fri.  . Dysrhythmia    afib/flutter  . GERD (gastroesophageal reflux disease)   . Hypertension   . 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 Peripheral vascular disease (HCC)   . Pleural  effusion   . Pulmonary hypertension (HCC)   . Renal insufficiency   . Restless leg syndrome   . Shortness of breath dyspnea    with exertion  . Stroke (HCC) 06/2015   has had 1 other stroke. first one affected vision. the most recent one affected right arm   Assessment: 83 yo female with PMH of A.Fib, admitted with Discitis. Pharmacy consulted for warfarin dosing and monitoring. INR on admission 2.46.  Home Regimen: warfarin 2.5mg  QD  DATE  INR  DOSE 6/13  2.46  2.5mg  6/14  2.55  2.5 mg 6/15  2.64  2.5 mg 6/16                 2.53                 2.5 mg 6/17                 2.78                 2.5 mg 6/18  3.16  2 mg  Goal of Therapy:  INR 2-3 Monitor platelets by anticoagulation protocol: Yes   Plan:  INR slightly supratherapeutic today. Will decrease to 2  mg po daily and follow INR daily while on ABX.   [name], Pharm.D., BCPS Clinical Pharmacist 11/16/2016 10:25 AM

## 2016-11-17 DIAGNOSIS — M545 Low back pain: Secondary | ICD-10-CM

## 2016-11-17 LAB — PROTIME-INR
INR: 2.36
PROTHROMBIN TIME: 26.2 s — AB (ref 11.4–15.2)

## 2016-11-17 LAB — GLUCOSE, CAPILLARY
GLUCOSE-CAPILLARY: 115 mg/dL — AB (ref 65–99)
GLUCOSE-CAPILLARY: 164 mg/dL — AB (ref 65–99)
Glucose-Capillary: 117 mg/dL — ABNORMAL HIGH (ref 65–99)
Glucose-Capillary: 131 mg/dL — ABNORMAL HIGH (ref 65–99)

## 2016-11-17 MED ORDER — OXYCODONE HCL 5 MG PO TABS: 5.0000 mg | ORAL_TABLET | ORAL | Status: DC | PRN

## 2016-11-17 MED ORDER — LIDOCAINE 5 % EX PTCH
1.0000 | MEDICATED_PATCH | CUTANEOUS | Status: DC
  Filled 2016-11-17 (×3): qty 1

## 2016-11-17 MED ORDER — HALOPERIDOL LACTATE 5 MG/ML IJ SOLN
2.0000 mg | Freq: Four times a day (QID) | INTRAMUSCULAR | Status: DC | PRN
  Filled 2016-11-17: qty 1
  Filled 2016-11-17: qty 0.4

## 2016-11-17 MED ORDER — POLYETHYLENE GLYCOL 3350 17 G PO PACK
17.0000 g | PACK | Freq: Every day | ORAL | Status: DC
  Administered 2016-11-17 – 2016-11-18 (×2): 17 g via ORAL
  Filled 2016-11-17 (×2): qty 1

## 2016-11-17 NOTE — Progress Notes (Signed)
ANTICOAGULATION CONSULT NOTE -Follow Up  Pharmacy Consult for Warfarin dosing Indication: atrial fibrillation  Allergies  Allergen Reactions  . Codeine Itching and Other (See Comments)    Reaction:  Confusion/Hallucinations   . Contrast Media [Iodinated Diagnostic Agents] Other (See Comments)    "skin peel" breaks into a blistered rash, then peel. If given Benadryl and solumedrol she will be okay  . Oxycodone Other (See Comments)    Reaction:  Confusion/Hallucinations   . Quinine Derivatives Rash  . Tape Other (See Comments)    Skin is very thin and will blister and pull off skin with any tape except paper tape.  PLEASE USE PAPER TAPE.    Patient Measurements: Height: 5\' 5"  (165.1 cm) Weight: 163 lb 2.3 oz (74 kg) IBW/kg (Calculated) : 57  Vital Signs: Temp: 98.4 F (36.9 C) (06/19 0623) Temp Source: Oral (06/19 0623) BP: 147/69 (06/19 16100623) Pulse Rate: 87 (06/19 0623)  Recent Labs  11/15/16 0454 11/16/16 0759 11/16/16 0928 11/17/16 0538  HGB  --   --  10.3*  --   HCT  --   --  31.0*  --   PLT  --   --  224  --   LABPROT 29.9* 33.1*  --  26.2*  INR 2.78 3.16  --  2.36  CREATININE  --   --  5.67*  --     Estimated Creatinine Clearance: 7.8 mL/min (A) (by C-G formula based on SCr of 5.67 mg/dL (H)).   Medical History: Past Medical History:  Diagnosis Date  . Anemia   . Anxiety   . Arthritis    gout  . Atrial fibrillation (HCC)   . CHF (congestive heart failure) (HCC)    has had this off and on prior to dialysis but has had several times since  . Chronic kidney disease    end stage renal insufficiency.  on dialysis M,W,F  . Diabetes mellitus without complication (HCC)   . Dialysis patient [facility](HCC)    Mon.- Wed- Fri.  . Dysrhythmia    afib/flutter  . GERD (gastroesophageal reflux disease)   . Hypertension   . 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 Peripheral vascular disease (HCC)   . Pleural effusion   . Pulmonary hypertension (HCC)   .  Renal insufficiency   . Restless leg syndrome   . Shortness of breath dyspnea    with exertion  . Stroke (HCC) 06/2015   has had 1 other stroke. first one affected vision. the most recent one affected right arm   Assessment: 83 yo female with PMH of A.Fib, admitted with Discitis. Pharmacy consulted for warfarin dosing and monitoring. INR on admission 2.46.  Home Regimen: warfarin 2.5mg  QD  DATE  INR  DOSE 6/13  2.46  2.5mg  6/14  2.55  2.5 mg 6/15  2.64  2.5 mg 6/16                 2.53                 2.5 mg 6/17                 2.78                 2.5 mg 6/18  3.16  2 mg 6/19  2.36  2 mg  Goal of Therapy:  INR 2-3 Monitor platelets by anticoagulation protocol: Yes   Plan:  INR down to therapeutic today. Will continue 2  mg po daily and follow INR daily while on ABX.   [name], Pharm.D., BCPS Clinical Pharmacist 11/17/2016 8:18 AM

## 2016-11-17 NOTE — Progress Notes (Signed)
Daily Progress Note   Patient Name: [name]       Date: 11/17/2016 DOB: 1933-07-21  Age: 83 y.o. MRN#: [id number] Attending Physician: Houston Siren, MD Primary Care Physician: [name], MD Admit Date: 11/11/2016  Reason for Consultation/Follow-up: Establishing goals of care  Subjective: Patient alert but pleasantly confused this afternoon. She is very tearful during the conversation. Thinks we are "making" her take pain medication. Educated that pain medication is as needed but she will likely require a pain regimen with her ongoing back pain and inability to sit up for dialysis. No pain during my visit and she was able to tolerate sitting up in the bed.   Spoke with daughter [name] via telephone. She speaks of her mother being very confused and tearful through the weekend--she asked the RN take off the fentanyl patch yesterday. She also requested I reduce oxycodone to 5mg  q4h as needed. Speaks of her mom not participating with physical therapy but believes it is because of the medicine. Remains hopeful that she will tolerate dialysis sitting up once she gets the brace. Also concerned she has not had a BM. Colace BID and Miralax daily. Requests I follow-up with her in AM.   Length of Stay: 6  Current Medications: Scheduled Meds:  . calcium acetate  1,334 mg Oral TID WC  . diltiazem  120 mg Oral QHS  . docusate sodium  100 mg Oral BID  . epoetin (EPOGEN/PROCRIT) injection  10,000 Units Intravenous Q M,W,F-HD  . famotidine  10 mg Oral Daily  . fentaNYL  25 mcg Transdermal Q72H  . insulin aspart  0-9 Units Subcutaneous TID WC  . multivitamin  1 tablet Oral Daily  . omega-3 acid ethyl esters  1,000 mg Oral BID  . PARoxetine  40 mg Oral QHS  . polyethylene glycol  17 g Oral Daily   . pravastatin  20 mg Oral QPM  . warfarin  2 mg Oral q1800  . Warfarin - Pharmacist Dosing Inpatient   Does not apply q1800    Continuous Infusions: . vancomycin Stopped (11/16/16 1533)    PRN Meds: acetaminophen **OR** acetaminophen, albuterol, cyclobenzaprine, haloperidol lactate, ondansetron **OR** ondansetron (ZOFRAN) IV, oxyCODONE, traMADol  Physical Exam  Constitutional: She appears cachectic. She appears ill.  HENT:  Head: Normocephalic and atraumatic.  Cardiovascular: Regular  rhythm.   Pulmonary/Chest: Effort normal.  Abdominal: Normal appearance.  Neurological: She is alert.  Pleasantly confused  Skin: Skin is warm and dry. Bruising noted.  Psychiatric: Her speech is delayed. Cognition and memory are impaired.  Nursing note and vitals reviewed.          Vital Signs: BP (!) 148/71 (BP Location: Left Arm)   Pulse 88   Temp 98.2 F (36.8 C) (Oral)   Resp 20   Ht 5\' 5"  (1.651 m)   Wt 74 kg (163 lb 2.3 oz)   SpO2 97%   BMI 27.15 kg/m  SpO2: SpO2: 97 % O2 Device: O2 Device: Nasal Cannula O2 Flow Rate: O2 Flow Rate (L/min): 2 L/min  Intake/output summary:   Intake/Output Summary (Last 24 hours) at 11/17/16 1615 Last data filed at 11/17/16 0900  Gross per 24 hour  Intake               50 ml  Output                0 ml  Net               50 ml   LBM: Last BM Date: 11/15/16 (per pt) Baseline Weight: Weight: 74 kg (163 lb 2.3 oz) Most recent weight: Weight: 74 kg (163 lb 2.3 oz)       Palliative Assessment/Data: PPS 40%   Flowsheet Rows     Most Recent Value  Intake Tab  Referral Department  Hospitalist  Unit at Time of Referral  Cardiac/Telemetry Unit  Palliative Care Primary Diagnosis  Sepsis/Infectious Disease  Date Notified  11/12/16  Palliative Care Type  New Palliative care  Reason for referral  Clarify Goals of Care  Date of Admission  11/11/16  Date first seen by Palliative Care  11/13/16  # of days Palliative referral response time  1  Day(s)  # of days IP prior to Palliative referral  1  Clinical Assessment  Palliative Performance Scale Score  50%  Psychosocial & Spiritual Assessment  Palliative Care Outcomes  Patient/Family meeting held?  Yes  Who was at the meeting?  patient and daughter ([name])  Palliative Care Outcomes  Clarified goals of care, Counseled regarding hospice, ACP counseling assistance, Provided psychosocial or spiritual support, Linked to palliative care logitudinal support, Improved pain interventions, Improved non-pain symptom therapy      Patient Active Problem List   Diagnosis Date Noted  . Palliative care by specialist   . Goals of care, counseling/discussion   . Lower back pain 08/17/2016  . Hyponatremia 08/17/2016  . Hyperkalemia 08/17/2016  . Heel ulcer (HCC) 08/10/2016  . Discitis 08/09/2016  . Atherosclerosis of native arteries of the extremities with ulceration (HCC) 04/14/2016  . Kidney dialysis as the cause of abnormal reaction of the patient, or of later complication, without mention of misadventure at the time of the procedure (CODE) 03/03/2016  . Pressure injury of skin 02/18/2016  . Osteomyelitis (HCC) 02/14/2016  . Hematoma 10/12/2015  . Leg pain, right 09/14/2015  . PVD (peripheral vascular disease) with claudication (HCC) 09/14/2015  . Pneumonia 08/13/2015  . Carotid stenosis 07/26/2015  . Amputation of fifth toe, left, traumatic (HCC) 06/24/2015  . Diabetes mellitus (HCC) 06/23/2015  . Diabetic neuropathy (HCC) 06/23/2015  . Chronic atrial fibrillation (HCC) 06/23/2015  . Left carotid artery stenosis 06/23/2015  . Cerebral infarction (HCC) 06/21/2015  . Pressure ulcer 06/19/2015  . ESRD on dialysis (HCC) 06/18/2015  . Respiratory distress 12/11/2014  Palliative Care Assessment & Plan   Patient Profile: 83 y.o. female  with past medical history of ESRD on HD, CVA, pulmonary hypertension, restless leg, PVD, HTN, GERD, CHF, afib, anxiety, arthritis, and anemia  admitted on 11/11/2016 with back pain. Recent diagnosis of L2-3 discitis and osteomyelitis. Followed outpatient by Dr. Sampson Goon. In ED, CT spine showed worsening L2-3 discitis. Started back on IV antibiotics. Evaluated by neurosurgery. She is not a candidate for surgery due to age and multiple co-morbidities. Recommending brace and pain management. Plan is for outpatient IV vancomycin for 12 weeks. Palliative medicine consultation for goals of care.  Assessment: Discitis/osteomyelitis ESRD on HD Back pain  Recommendations/Plan:  DNR/DNI  Ortho brace pending-RN to f/u with ortho tech.   May try heating pad to lower back.   Daughter requested oxycodone be reduced to 5mg  q4h prn. Also keep fentanyl patch off.   Continue colace BID. Scheduled Miralax.   Plan remains SNF with palliative. Patient needs to sit for dialysis. Daughter hopeful she will sit with brace.   PMT will continue to support patient/family through hospitalization.   Code Status: DNR/DNI   Code Status Orders        Start     Ordered   11/11/16 1326  Do not attempt resuscitation (DNR)  Continuous    Question Answer Comment  In the event of cardiac or respiratory ARREST Do not call a "code blue"   In the event of cardiac or respiratory ARREST Do not perform Intubation, CPR, defibrillation or ACLS   In the event of cardiac or respiratory ARREST Use medication by any route, position, wound care, and other measures to relive pain and suffering. May use oxygen, suction and manual treatment of airway obstruction as needed for comfort.      11/11/16 1325    Code Status History    Date Active Date Inactive Code Status Order ID Comments User Context   10/29/2016 10:24 AM 10/29/2016  3:10 PM Full Code [id number]  Annice Needy, MD Inpatient   08/10/2016  1:43 AM 08/17/2016 10:58 PM DNR [id number]  Oralia Manis, MD Inpatient   04/27/2016 11:23 AM 04/27/2016  2:48 PM Full Code [id number]  Annice Needy, MD Inpatient   02/14/2016   8:31 PM 02/18/2016  5:05 PM DNR [id number]  Auburn Bilberry, MD ED   02/14/2016  8:31 PM 02/14/2016  8:31 PM Full Code [id number]  Auburn Bilberry, MD ED   10/12/2015  5:49 PM 10/15/2015  3:46 PM DNR [id number]  Altamese Dilling, MD Inpatient   09/14/2015 10:46 PM 09/18/2015  8:40 PM Full Code [id number]  Marguarite Arbour, MD Inpatient   08/13/2015  1:01 PM 08/16/2015  4:09 PM Full Code [id number]  Altamese Dilling, MD Inpatient   06/18/2015 11:41 PM 06/25/2015  1:04 AM DNR [id number]  Ramonita Lab, MD Inpatient   06/07/2015  3:46 PM 06/07/2015  6:48 PM Full Code [id number]  Gwyneth Revels, DPM Inpatient   12/11/2014 10:46 PM 12/13/2014  9:38 PM DNR [id number]  Enedina Finner, MD Inpatient   12/11/2014  9:10 PM 12/11/2014 10:46 PM DNR [id number]  Enedina Finner, MD ED    Advance Directive Documentation     Most Recent Value  Type of Advance Directive  Healthcare Power of Attorney  Pre-existing out of facility DNR order (yellow form or pink MOST form)  -  "MOST" Form in Place?  -       Prognosis:   Unable to determine: guarded with discitis/osteomyelitis, ESRD on HD, and functional  status decline due to chronic pain.   Discharge Planning:  To Be Determined likely SNF with palliative services  Care plan was discussed with patient, daughter ([name]), Dr. Cherlynn Kaiser, and RN  Thank you for allowing the Palliative Medicine Team to assist in the care of this patient.   Time In: 1250 Time Out: 1325 Total Time Prolonged Time Billed no      Greater than 50%  of this time was spent counseling and coordinating care related to the above assessment and plan.  Vennie Homans, FNP-C Palliative Medicine Team  Phone: [phone] Fax: [phone]  Please contact Palliative Medicine Team phone at [phone] for questions and concerns.

## 2016-11-17 NOTE — Progress Notes (Signed)
[facility] [place] [facility] [place] [place]   PATIENT NAME: [name]    MR#:  [id number]  DATE OF BIRTH:  April 30, 1934  SUBJECTIVE:   Patient is confused and hallucinating this morning. Also agitated at times. No other acute events overnight.  REVIEW OF SYSTEMS:    Review of Systems  Unable to perform ROS: Mental acuity    Nutrition: Renal/Carb modified. Tolerating Diet: Yes Tolerating PT: Eval noted.   DRUG ALLERGIES:   Allergies  Allergen Reactions  . Codeine Itching and Other (See Comments)    Reaction:  Confusion/Hallucinations   . Contrast Media [Iodinated Diagnostic Agents] Other (See Comments)    "skin peel" breaks into a blistered rash, then peel. If given Benadryl and solumedrol she will be okay  . Oxycodone Other (See Comments)    Reaction:  Confusion/Hallucinations   . Quinine Derivatives Rash  . Tape Other (See Comments)    Skin is very thin and will blister and pull off skin with any tape except paper tape.  PLEASE USE PAPER TAPE.    VITALS:  Blood pressure (!) 148/71, pulse 88, temperature 98.2 F (36.8 C), temperature source Oral, resp. rate 20, height 5\' 5"  (1.651 m), weight 74 kg (163 lb 2.3 oz), SpO2 97 %.  PHYSICAL EXAMINATION:   Physical Exam  GENERAL:  83 y.o.-year-old patient lying in bed in no acute distress.  EYES: Pupils equal, round, reactive to light and accommodation. No scleral icterus. Extraocular muscles intact.  HEENT: Head atraumatic, normocephalic. Oropharynx and nasopharynx clear.  NECK:  Supple, no jugular venous distention. No thyroid enlargement, no tenderness.  LUNGS: Normal breath sounds bilaterally, no wheezing, rales, rhonchi. No use of accessory muscles of respiration.  CARDIOVASCULAR: S1, S2 normal. No murmurs, rubs, or gallops.  ABDOMEN: Soft, nontender, nondistended. Bowel sounds present. No organomegaly or mass.  EXTREMITIES: No cyanosis, clubbing or edema b/l.    NEUROLOGIC: Cranial nerves II through XII  are intact. No focal Motor or sensory deficits b/l. Globally weak  PSYCHIATRIC: The patient is alert and oriented x 1.  Hallucinating at times. SKIN: No obvious rash, lesion, or ulcer.   Right upper extremity AV fistula with good bruit and thrill.  LABORATORY PANEL:   CBC  Recent Labs Lab 11/16/16 0928  WBC 8.5  HGB 10.3*  HCT 31.0*  PLT 224   ------------------------------------------------------------------------------------------------------------------  Chemistries   Recent Labs Lab 11/11/16 0926  11/16/16 0928  NA 133*  < > 130*  K 4.5  < > 4.5  CL 93*  < > 95*  CO2 20*  < > 24  GLUCOSE 73  < > 136*  BUN 87*  < > 43*  CREATININE 8.60*  < > 5.67*  CALCIUM 7.8*  < > 9.0  AST 23  --   --   ALT 8*  --   --   ALKPHOS 58  --   --   BILITOT 0.8  --   --   < > = values in this interval not displayed. ------------------------------------------------------------------------------------------------------------------  Cardiac Enzymes No results for input(s): TROPONINI in the last 168 hours. ------------------------------------------------------------------------------------------------------------------  RADIOLOGY:  No results found.   ASSESSMENT AND PLAN:   83 year old female with past medical history of diabetes, end-stage renal disease and hemodialysis, history of discitis, chronic atrial fibrillation, CHF, hypertension, GERD, restless leg syndrome who presented to the hospital due to worsening back pain.  1. Severe back pain secondary to discitis/osteomyelitis-continue IV vancomycin, patient will need long-term therapy as per infectious disease  for about 12 weeks. -Seen by neurosurgery and not a good surgical candidate presently.  -Can consider facet joint injection as an outpatient with pain management, If pain continues to be an issue -Continue pain control with fentanyl patch, oxycodone/tramadol, Flexeril.  2. Altered mental status/delirium-secondary to pain  medications. -Continue when necessary Haldol.  3. End-stage renal disease on hemodialysis-nephrology following. Continue dialysis on Monday Wednesday Friday. Patient still cannot sit up to get hemodialysis given her significant pack pain.  4. History of chronic atrial fibrillation-rate controlled. Continue Cardizem, continue warfarin.  5. Diabetes type 2 without complication-continue sliding scale insulin. Blood sugar stable.  6. Secondary hyperparathyroidism-continue PhosLo.  7. Anxiety-continue Paxil.  8. Hyperlipidemia-continue Pravachol.  ?? LTACH placement and Case Management Aware.   All the records are reviewed and case discussed with Care Management/Social Worker. Management plans discussed with the patient, family and they are in agreement.  CODE STATUS: DNR  DVT Prophylaxis: Warfarin  TOTAL TIME TAKING CARE OF THIS PATIENT: 25 minutes.   POSSIBLE D/C IN 1-2 DAYS, DEPENDING ON CLINICAL CONDITION.   Houston SirenSAINANI,Olisa Quesnel J M.D on 11/17/2016 at 2:28 PM  Between 7am to 6pm - Pager - [phone]  After 6pm go to [email or web address] - Social research officer, governmentpassword [email or web address]  [facility]  Office  [phone]  CC: Primary care physician; Rolm GalaGrandis, Heidi, MD

## 2016-11-17 NOTE — Care Management (Signed)
Verbal RNCM consult to pursue LTACH from Dr. Cherlynn KaiserSAINANI.  Patient does not meet qualifications, as she has not had a 3 night icu stay.  Referral made to [facility] with [facility].  After chart review Notified by [facility] that patient does not meet intensity for transfer.
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
## 2016-11-17 NOTE — Progress Notes (Signed)
Ortho-tech called for patient brace. Awaiting for arrival. Estimated arrival 11/17/16

## 2016-11-17 NOTE — Progress Notes (Signed)
[facility]  ROUNDING NOTE   Subjective:   Patient states that pain is better controlled at present Was sitting up in bed when seen Able to eat without nausea or vomiting   Objective:  Vital signs in last 24 hours:  Temp:  [97.8 F (36.6 C)-98.4 F (36.9 C)] 98.2 F (36.8 C) (06/19 1300) Pulse Rate:  [87-117] 88 (06/19 1300) Resp:  [20-22] 20 (06/19 1300) BP: (147-168)/(66-71) 148/71 (06/19 1300) SpO2:  [96 %-99 %] 97 % (06/19 1300)  Weight change:  Filed Weights   11/11/16 1812 11/13/16 1515 11/16/16 0920  Weight: 71.2 kg (156 lb 15.5 oz) 72 kg (158 lb 11.7 oz) 74 kg (163 lb 2.3 oz)    Intake/Output: I/O last 3 completed shifts: In: 0  Out: 1000 [Other:1000]   Intake/Output this shift:  Total I/O In: 50 [P.O.:50] Out: -   Physical Exam: General: NAD,   Head: Normocephalic, atraumatic. Moist oral mucosal membranes  Eyes: Anicteric,  Neck: Supple,   Lungs:  Clear to auscultation  Heart: Irregular rhythm  Abdomen:  Soft, nontender,   Extremities: no peripheral edema.  Neurologic: Nonfocal, moving all four extremities  Skin: Scattered eccymosis  Access: Right AVF    Basic Metabolic Panel:  Recent Labs Lab 11/11/16 0926 11/12/16 0003 11/13/16 1505 11/14/16 0352 11/16/16 0928  NA 133*  --  132* 135 130*  K 4.5  --  4.7 3.7 4.5  CL 93*  --  95* 98* 95*  CO2 20*  --  22 29 24   GLUCOSE 73  --  180* 150* 136*  BUN 87*  --  52* 22* 43*  CREATININE 8.60*  --  5.85* 3.41* 5.67*  CALCIUM 7.8*  --  8.7* 8.6* 9.0  PHOS 8.1* 3.9 6.1*  --  4.3    Liver Function Tests:  Recent Labs Lab 11/11/16 0926 11/13/16 1505 11/16/16 0928  AST 23  --   --   ALT 8*  --   --   ALKPHOS 58  --   --   BILITOT 0.8  --   --   PROT 6.8  --   --   ALBUMIN 2.9* 2.8* 2.8*   No results for input(s): LIPASE, AMYLASE in the last 168 hours. No results for input(s): AMMONIA in the last 168 hours.  CBC:  Recent Labs Lab 11/11/16 0926 11/13/16 1505  11/14/16 0352 11/16/16 0928  WBC 8.4 7.7 6.4 8.5  HGB 10.0* 10.3* 10.6* 10.3*  HCT 30.3* 31.0* 32.1* 31.0*  MCV 92.9 92.2 92.9 93.2  PLT 222 208 199 224    Cardiac Enzymes: No results for input(s): CKTOTAL, CKMB, CKMBINDEX, TROPONINI in the last 168 hours.  BNP: Invalid input(s): POCBNP  CBG:  Recent Labs Lab 11/16/16 1633 11/16/16 2112 11/17/16 0730 11/17/16 1143 11/17/16 1652  GLUCAP 138* 150* 117* 115* 164*    Microbiology: Results for orders placed or performed during the hospital encounter of 11/11/16  Blood culture (routine x 2)     Status: None   Collection Time: 11/11/16  9:45 AM  Result Value Ref Range Status   Specimen Description BLOOD L AC  Final   Special Requests   Final    BOTTLES DRAWN AEROBIC AND ANAEROBIC Blood Culture adequate volume   Culture NO GROWTH 5 DAYS  Final   Report Status 11/16/2016 FINAL  Final  Blood culture (routine x 2)     Status: None   Collection Time: 11/11/16 10:00 AM  Result Value Ref Range Status  Specimen Description BLOOD L FA  Final   Special Requests   Final    BOTTLES DRAWN AEROBIC AND ANAEROBIC Blood Culture adequate volume   Culture NO GROWTH 5 DAYS  Final   Report Status 11/16/2016 FINAL  Final  MRSA PCR Screening     Status: None   Collection Time: 11/12/16 12:49 AM  Result Value Ref Range Status   MRSA by PCR NEGATIVE NEGATIVE Final    Comment:        The GeneXpert MRSA Assay (FDA approved for NASAL specimens only), is one component of a comprehensive MRSA colonization surveillance program. It is not intended to diagnose MRSA infection nor to guide or monitor treatment for MRSA infections.     Coagulation Studies:  Recent Labs  11/15/16 0454 11/16/16 0759 11/17/16 0538  LABPROT 29.9* 33.1* 26.2*  INR 2.78 3.16 2.36    Urinalysis: No results for input(s): COLORURINE, LABSPEC, PHURINE, GLUCOSEU, HGBUR, BILIRUBINUR, KETONESUR, PROTEINUR, UROBILINOGEN, NITRITE, LEUKOCYTESUR in the last 72  hours.  Invalid input(s): APPERANCEUR    Imaging: No results found.   Medications:   . vancomycin Stopped (11/16/16 1533)   . calcium acetate  1,334 mg Oral TID WC  . diltiazem  120 mg Oral QHS  . docusate sodium  100 mg Oral BID  . epoetin (EPOGEN/PROCRIT) injection  10,000 Units Intravenous Q M,W,F-HD  . famotidine  10 mg Oral Daily  . fentaNYL  25 mcg Transdermal Q72H  . insulin aspart  0-9 Units Subcutaneous TID WC  . multivitamin  1 tablet Oral Daily  . omega-3 acid ethyl esters  1,000 mg Oral BID  . PARoxetine  40 mg Oral QHS  . polyethylene glycol  17 g Oral Daily  . pravastatin  20 mg Oral QPM  . warfarin  2 mg Oral q1800  . Warfarin - Pharmacist Dosing Inpatient   Does not apply q1800   acetaminophen **OR** acetaminophen, albuterol, cyclobenzaprine, haloperidol lactate, ondansetron **OR** ondansetron (ZOFRAN) IV, oxyCODONE, traMADol  Assessment/ Plan:  [name] is a 83 y.o. white female with atrial fibrillation, hypertension, gout, insulin-dependent diabetes mellitus type 2, diabetic neuropathy, CVA with right visual field blindness, hyperlipidemia, major depressive disorder, overactive bladder with incontinence, generalized anxiety disorder, congestive heart failure diastolic, parathyroidectomy, and osteoarthritis. Currently with discitis  MWF [facility] [address] [facility]   1. End Stage Renal Disease: MWF. Hemodialysis. Next treatment will need to be done in chair - Continue MWF schedule. Try to sit in chair tomorrow - Next HD on Wednesday  2. Discitis :  - Appreciate ID input. Neurosurgery consulted 6/14. - not a surgical candidate - IV vancomycin restarted - Consider Lidocaine patch for local anesthetic effect  3. Secondary Hyperparathyroidism:   - calcium acetate   4. Anemia of chronic kidney disease: hemoglobin 10.3 - epo with HD treatment.    LOS: 6 Chey Cho 6/19/20185:08 PM

## 2016-11-17 NOTE — Progress Notes (Signed)
Patient increased in agitation, yelling out, and found trying to get out of bed. Notified MD. Acknowledged. new order received.

## 2016-11-18 ENCOUNTER — Ambulatory Visit: Payer: Medicare Other | Admitting: Internal Medicine

## 2016-11-18 LAB — GLUCOSE, CAPILLARY
GLUCOSE-CAPILLARY: 120 mg/dL — AB (ref 65–99)
Glucose-Capillary: 124 mg/dL — ABNORMAL HIGH (ref 65–99)
Glucose-Capillary: 141 mg/dL — ABNORMAL HIGH (ref 65–99)

## 2016-11-18 LAB — PROTIME-INR
INR: 1.97
Prothrombin Time: 22.7 seconds — ABNORMAL HIGH (ref 11.4–15.2)

## 2016-11-18 MED ORDER — WARFARIN SODIUM 2.5 MG PO TABS
2.5000 mg | ORAL_TABLET | Freq: Every day | ORAL | Status: DC
  Administered 2016-11-18: 2.5 mg via ORAL
  Filled 2016-11-18 (×2): qty 1

## 2016-11-18 MED ORDER — LACTULOSE 10 GM/15ML PO SOLN
30.0000 g | Freq: Once | ORAL | Status: AC
  Administered 2016-11-18: 30 g via ORAL
  Filled 2016-11-18: qty 60

## 2016-11-18 NOTE — Care Management Important Message (Signed)
Important Message  Patient Details  Name: [name] MRN: [id number] Date of Birth: 04/24/1934   Medicare Important Message Given:  Yes    Chapman FitchBOWEN, Avrom Robarts T, RN 11/18/2016, 3:36 PM

## 2016-11-18 NOTE — Progress Notes (Signed)
No BM since 11/15/16.  Order received for lactulose

## 2016-11-18 NOTE — Progress Notes (Signed)
This note also relates to the following rows which could not be included: Resp - Cannot attach notes to unvalidated device data SpO2 - Cannot attach notes to unvalidated device data  Post hemodialysis treatment

## 2016-11-18 NOTE — Progress Notes (Signed)
Patient sent to dialysis in wheelchair and was then assisted from the wheelchair to the dialysis chair

## 2016-11-18 NOTE — Progress Notes (Signed)
Physical Therapy Treatment Patient Details Name: [name] MRN: [id number] DOB: Jun 09, 1933 Today's Date: 11/18/2016    History of Present Illness 83 yo female with, has discitis L2-3 in lumbar spine, bronchitis vs PNA per imaging report.  PMHx:  CHF, COPD, a-fib, neuropathy, R heel ulcer with varied states of healing over the past few months.      PT Comments    Pt able to sit up in recliner with +2 mod assist for a stand pivot transfer from EOB to recliner. TLSO not donned prior to transfer - pt requested to leave it off and stated it wouldn't help her pain (pt has yet to don this brace). Pt performed bed mobility (supine to sit) with +1 mod assist, and she sat EOB for 1min with cga for safety. Pt's cognitive status appears improved this session, but continues to be below her baseline. Pt able to follow commands and was agreeable to participate in therapy. RN notified pt in chair. RN planning to time how long pt tolerates sitting to determine if she could tolerate dialysis this afternoon. Will continue to progress.   Follow Up Recommendations  SNF     Equipment Recommendations       Recommendations for Other Services       Precautions / Restrictions Precautions Precautions: Fall Precaution Comments: Pt tends to "scoot" out of bed/chair. Family educated on this. Required Braces or Orthoses:  (TLSO not donned this visit - pt requested it remain off.) Restrictions Weight Bearing Restrictions: No    Mobility  Bed Mobility Overal bed mobility: Needs Assistance Bed Mobility: Supine to Sit     Supine to sit: Mod assist;HOB elevated     General bed mobility comments: Supine to sit EOB with mod assist - pt pulled on both hands of PT to get to EOB. No assist required for LE management. Pt steady at EOB with fair trunk control. Pt sat for 1min prior to transfer to recliner while PT donned gait belt.   Transfers Overall transfer level: Needs assistance Equipment used:  None Transfers: Stand Pivot Transfers   Stand pivot transfers: Mod assist       General transfer comment: Pt able to keep her feet on the floor while mod assist stand pivot transfer to recliner performed.   Ambulation/Gait             General Gait Details: did not perform this session - stand pivot transfer from EOB to recliner for safety.   Stairs            Wheelchair Mobility    Modified Rankin (Stroke Patients Only)       Balance Overall balance assessment: Needs assistance     Sitting balance - Comments: Sat EOB with CGA for safety. No LOB noted. Pt sat with feet on floor and UE support. Cueing to remain in this position versus lifting feet off ground.                                    Cognition Arousal/Alertness: Awake/alert Behavior During Therapy: WFL for tasks assessed/performed Overall Cognitive Status: Impaired/Different from baseline                                 General Comments: Pt fearful of movement initially, but agreeable to participate in PT today. Pt able to follow commands consistently.  Cognitive status impaired compared to baseline, however she appears greatly improved this session (less confusion, engaged in therapy, and conversational). Pt tearful at end of session when asking about visiting family.      Exercises      General Comments        Pertinent Vitals/Pain Pain Assessment: Faces Faces Pain Scale: Hurts a little bit Pain Location: Back Pain Descriptors / Indicators: Grimacing Pain Intervention(s): Limited activity within patient's tolerance;Monitored during session;Repositioned    Home Living                      Prior Function            PT Goals (current goals can now be found in the care plan section) Acute Rehab PT Goals Patient Stated Goal: have less pain PT Goal Formulation: With patient Time For Goal Achievement: 11/26/16 Potential to Achieve Goals: Fair Progress  towards PT goals: Progressing toward goals    Frequency    Min 2X/week      PT Plan Current plan remains appropriate    Co-evaluation              AM-PAC PT "6 Clicks" Daily Activity  Outcome Measure  Difficulty turning over in bed (including adjusting bedclothes, sheets and blankets)?: A Lot Difficulty moving from lying on back to sitting on the side of the bed? : Total Difficulty sitting down on and standing up from a chair with arms (e.g., wheelchair, bedside commode, etc,.)?: Total Help needed moving to and from a bed to chair (including a wheelchair)?: Total Help needed walking in hospital room?: Total Help needed climbing 3-5 steps with a railing? : Total 6 Click Score: 7    End of Session   Activity Tolerance: Patient tolerated treatment well Patient left: in chair;with call bell/phone within reach;with chair alarm set;with family/visitor present Nurse Communication: Mobility status PT Visit Diagnosis: Muscle weakness (generalized) (M62.81);Pain;Difficulty in walking, not elsewhere classified (R26.2) Pain - part of body:  (low back)     Time: 5366-4403 PT Time Calculation (min) (ACUTE ONLY): 11 min  Charges:                       G Codes:       Mabeline Caras, PT, SPT  [place] 11/18/2016, 11:54 AM

## 2016-11-18 NOTE — Progress Notes (Signed)
Patient doing very well sitting up in the chair for 1 hour.  She refused the brace with PT.  I put her back to bed so she would not be worn out before she attempts to stay in the chair for 4 hours while in dialysis

## 2016-11-18 NOTE — Progress Notes (Signed)
[facility] -  at [facility]   PATIENT NAME: [name]    MR#:  [id number]  DATE OF BIRTH:  [birth year]  SUBJECTIVE:   Patient is sitting up in a chair with granddaughter at bedside. More awake alert and oriented today. No periods of agitation overnight. Back pain has improved. Patient did not want to wear the brace.  REVIEW OF SYSTEMS:    Review of Systems  Unable to perform ROS: Mental acuity  Constitutional: Negative for chills and fever.  HENT: Negative for congestion and tinnitus.   Eyes: Negative for blurred vision and double vision.  Respiratory: Negative for cough, shortness of breath and wheezing.   Cardiovascular: Negative for chest pain, orthopnea and PND.  Gastrointestinal: Negative for abdominal pain, diarrhea, nausea and vomiting.  Genitourinary: Negative for dysuria and hematuria.  Neurological: Negative for dizziness, sensory change and focal weakness.  All other systems reviewed and are negative.   Nutrition: Renal/Carb modified. Tolerating Diet: Yes Tolerating PT: Eval noted.   DRUG ALLERGIES:   Allergies  Allergen Reactions  . Codeine Itching and Other (See Comments)    Reaction:  Confusion/Hallucinations   . Contrast Media [Iodinated Diagnostic Agents] Other (See Comments)    "skin peel" breaks into a blistered rash, then peel. If given Benadryl and solumedrol she will be okay  . Oxycodone Other (See Comments)    Reaction:  Confusion/Hallucinations   . Quinine Derivatives Rash  . Tape Other (See Comments)    Skin is very thin and will blister and pull off skin with any tape except paper tape.  PLEASE USE PAPER TAPE.    VITALS:  Blood pressure (!) 142/62, pulse 99, temperature 98.1 F (36.7 C), temperature source Oral, resp. rate 16, height 5\' 5"  (1.651 m), weight 74 kg (163 lb 2.3 oz), SpO2 90 %.  PHYSICAL EXAMINATION:   Physical Exam  GENERAL:  83 y.o.-year-old patient sitting up in chair in no acute distress.  EYES:  Pupils equal, round, reactive to light and accommodation. No scleral icterus. Extraocular muscles intact.  HEENT: Head atraumatic, normocephalic. Oropharynx and nasopharynx clear.  NECK:  Supple, no jugular venous distention. No thyroid enlargement, no tenderness.  LUNGS: Normal breath sounds bilaterally, no wheezing, rales, rhonchi. No use of accessory muscles of respiration.  CARDIOVASCULAR: S1, S2 normal. No murmurs, rubs, or gallops.  ABDOMEN: Soft, nontender, nondistended. Bowel sounds present. No organomegaly or mass.  EXTREMITIES: No cyanosis, clubbing or edema b/l.    NEUROLOGIC: Cranial nerves II through XII are intact. No focal Motor or sensory deficits b/l. Globally weak. PSYCHIATRIC: The patient is alert and oriented x 3.   SKIN: No obvious rash, lesion, or ulcer.   Right upper extremity AV fistula with good bruit and thrill.  LABORATORY PANEL:   CBC  Recent Labs Lab 11/16/16 0928  WBC 8.5  HGB 10.3*  HCT 31.0*  PLT 224   ------------------------------------------------------------------------------------------------------------------  Chemistries   Recent Labs Lab 11/16/16 0928  NA 130*  K 4.5  CL 95*  CO2 24  GLUCOSE 136*  BUN 43*  CREATININE 5.67*  CALCIUM 9.0   ------------------------------------------------------------------------------------------------------------------  Cardiac Enzymes No results for input(s): TROPONINI in the last 168 hours. ------------------------------------------------------------------------------------------------------------------  RADIOLOGY:  No results found.   ASSESSMENT AND PLAN:   83 year old female with past medical history of diabetes, end-stage renal disease and hemodialysis, history of discitis, chronic atrial fibrillation, CHF, hypertension, GERD, restless leg syndrome who presented to the hospital due to worsening back pain.  1. Severe back  pain secondary to discitis/osteomyelitis-continue IV vancomycin,  patient will need long-term therapy as per infectious disease for about 12 weeks. -Seen by neurosurgery and not a good surgical candidate presently.  -Can consider facet joint injection as an outpatient with pain management, If pain continues to be an issue - pain meds adjusted due to delirium/AMS. Off Fentanyl patch.  Cont. Oxycodone/Tramadol, Flexeril PRN and Lidocaine patch. Pt. Refused to wear the back brace.  Appreciate Palliative care Help.   2. Altered mental status/delirium-secondary to pain medications. Much improved today.  - off Fentanyl patch now.   -Continue when necessary Haldol for agitation.   3. End-stage renal disease on hemodialysis-nephrology following. Continue dialysis on Monday Wednesday Friday. - since pt. Is sitting up in chair today will attempt to do Dialysis today in chair to see if she tolerates it.   4. History of chronic atrial fibrillation-rate controlled. Continue Cardizem, continue warfarin.  5. Diabetes type 2 without complication-continue sliding scale insulin. Blood sugar stable.  6. Secondary hyperparathyroidism-continue PhosLo.  7. Anxiety-continue Paxil.  8. Hyperlipidemia-continue Pravachol.   All the records are reviewed and case discussed with Care Management/Social Worker. Management plans discussed with the patient, family and they are in agreement.  CODE STATUS: DNR  DVT Prophylaxis: Warfarin  TOTAL TIME TAKING CARE OF THIS PATIENT: 25 minutes.   POSSIBLE D/C IN 1-2 DAYS, DEPENDING ON CLINICAL CONDITION.   Houston Siren M.D on 11/18/2016 at 1:38 PM  Between 7am to 6pm - Pager - [phone]  After 6pm go to [email or web address] - Social research officer, government  [facility] [place]  Office  [phone]  CC: Primary care physician; [name], MD

## 2016-11-18 NOTE — Progress Notes (Signed)
OT Cancellation Note  Patient Details Name: Natalie RimaJoann Y Medinger MRN: [id number] DOB: 1933-12-04   Cancelled Treatment:    Reason Eval/Treat Not Completed: Other (comment). Chart reviewed, spoke with RN. Pt to start hemodialysis shortly and pt declined OOB activity this afternoon. Will re-attempt OT treatment session next date.  Richrd PrimeJamie Stiller, MPH, MS, OTR/L ascom [phone] 11/18/16, 4:19 PM

## 2016-11-18 NOTE — Progress Notes (Signed)
NA 140

## 2016-11-18 NOTE — Progress Notes (Signed)
[facility] CLINIC INFECTIOUS DISEASE PROGRESS NOTE Date of Admission:  11/11/2016     ID: ARTHELLA HEADINGS is a 83 y.o. female with vert osteo Active Problems:   Discitis   Palliative care by specialist   Goals of care, counseling/discussion   Subjective: A little less pain, more awake. No fevers  ROS  Eleven systems are reviewed and negative except per hpi  Medications:  Antibiotics Given (last 72 hours)    Date/Time Action Medication Dose Rate   11/16/16 1433 New Bag/Given   vancomycin (VANCOCIN) IVPB 750 mg/150 ml premix 750 mg 150 mL/hr     . calcium acetate  1,334 mg Oral TID WC  . diltiazem  120 mg Oral QHS  . docusate sodium  100 mg Oral BID  . epoetin (EPOGEN/PROCRIT) injection  10,000 Units Intravenous Q M,W,F-HD  . famotidine  10 mg Oral Daily  . insulin aspart  0-9 Units Subcutaneous TID WC  . lidocaine  1 patch Transdermal Q24H  . multivitamin  1 tablet Oral Daily  . omega-3 acid ethyl esters  1,000 mg Oral BID  . PARoxetine  40 mg Oral QHS  . polyethylene glycol  17 g Oral Daily  . pravastatin  20 mg Oral QPM  . warfarin  2.5 mg Oral q1800  . Warfarin - Pharmacist Dosing Inpatient   Does not apply q1800    Objective: Vital signs in last 24 hours: Temp:  [98.1 F (36.7 C)-98.5 F (36.9 C)] 98.1 F (36.7 C) (06/20 1238) Pulse Rate:  [76-99] 99 (06/20 1238) Resp:  [16-18] 16 (06/20 1238) BP: (138-158)/(62-68) 142/62 (06/20 1238) SpO2:  [89 %-98 %] 90 % (06/20 1244) Constitutional:  Chronically ill, sitting comfortably in bed and conversant HENT: [place]/AT, PERRLA, no scleral icterus Mouth/Throat: Oropharynx is clear and moist. No oropharyngeal exudate.  Cardiovascular: Normal rate, regular rhythm and normal heart sounds. Pulmonary/Chest: Effort normal and breath sounds normal. No respiratory distress.  has no wheezes.  Neck = supple, no nuchal rigidity Abdominal: Soft. Bowel sounds are normal.  exhibits no distension. There is no tenderness.  Lymphadenopathy:  no cervical adenopathy. No axillary adenopathy Neurological: awake and interactive Skin: Skin is warm and dry. No rash noted. No erythema.  Psychiatric: pleasant   Lab Results  Recent Labs  11/16/16 0928  WBC 8.5  HGB 10.3*  HCT 31.0*  NA 130*  K 4.5  CL 95*  CO2 24  BUN 43*  CREATININE 5.67*   Lab Results  Component Value Date   ESRSEDRATE 69 (H) 11/12/2016   Lab Results  Component Value Date   CRP 7.1 (H) 11/11/2016    Microbiology: Results for orders placed or performed during the hospital encounter of 11/11/16  Blood culture (routine x 2)     Status: None   Collection Time: 11/11/16  9:45 AM  Result Value Ref Range Status   Specimen Description BLOOD L AC  Final   Special Requests   Final    BOTTLES DRAWN AEROBIC AND ANAEROBIC Blood Culture adequate volume   Culture NO GROWTH 5 DAYS  Final   Report Status 11/16/2016 FINAL  Final  Blood culture (routine x 2)     Status: None   Collection Time: 11/11/16 10:00 AM  Result Value Ref Range Status   Specimen Description BLOOD L FA  Final   Special Requests   Final    BOTTLES DRAWN AEROBIC AND ANAEROBIC Blood Culture adequate volume   Culture NO GROWTH 5 DAYS  Final   Report  Status 11/16/2016 FINAL  Final  MRSA PCR Screening     Status: None   Collection Time: 11/12/16 12:49 AM  Result Value Ref Range Status   MRSA by PCR NEGATIVE NEGATIVE Final    Comment:        The GeneXpert MRSA Assay (FDA approved for NASAL specimens only), is one component of a comprehensive MRSA colonization surveillance program. It is not intended to diagnose MRSA infection nor to guide or monitor treatment for MRSA infections.     Studies/Results: No results found.  Assessment/Plan: Helane RimaJoann Y Krusemark is a 83 y.o. female with ESRD and recent treatment for vert osteomyelitis.  She had aspiration done on initial admission with cx neg but GS with GPC.  She has had issues with myoclonus due to ceftazidime.  Clinically improving  since starting vanco a week ago.  Working on back pain with PT, pain control. Trying to sit up for duration of HD  Recommendations Cont vanco- plan on 12-16 week course Continue pain control Thank you very much for the consult. Will follow with you.  Everly Rubalcava P   11/18/2016, 2:34 PM

## 2016-11-18 NOTE — Progress Notes (Signed)
Pre-Hemodialysis tx

## 2016-11-18 NOTE — Progress Notes (Signed)
This note also relates to the following rows which could not be included: Pulse Rate - Cannot attach notes to unvalidated device data Resp - Cannot attach notes to unvalidated device data SpO2 - Cannot attach notes to unvalidated device data  Hemodialysis started 

## 2016-11-18 NOTE — Progress Notes (Signed)
[facility]  ROUNDING NOTE   Subjective:   Patient states that pain is better controlled at present Was sitting up in bed when seen Reports nausea this morning after chocking on scrambled eggs for breakfast No SOB   Objective:  Vital signs in last 24 hours:  Temp:  [98.1 F (36.7 C)-98.5 F (36.9 C)] 98.1 F (36.7 C) (06/20 1238) Pulse Rate:  [76-99] 99 (06/20 1238) Resp:  [16-18] 16 (06/20 1238) BP: (138-158)/(62-68) 142/62 (06/20 1238) SpO2:  [89 %-98 %] 90 % (06/20 1244)  Weight change:  Filed Weights   11/11/16 1812 11/13/16 1515 11/16/16 0920  Weight: 71.2 kg (156 lb 15.5 oz) 72 kg (158 lb 11.7 oz) 74 kg (163 lb 2.3 oz)    Intake/Output: I/O last 3 completed shifts: In: 50 [P.O.:50] Out: 0    Intake/Output this shift:  No intake/output data recorded.  Physical Exam: General: NAD,   Head: Normocephalic, atraumatic. Moist oral mucosal membranes  Eyes: Anicteric,  Neck: Supple,   Lungs:  Clear to auscultation  Heart: Irregular rhythm  Abdomen:  Soft, nontender,   Extremities: no peripheral edema.  Neurologic: Nonfocal, moving all four extremities  Skin: Scattered eccymosis  Access: Right AVF    Basic Metabolic Panel:  Recent Labs Lab 11/12/16 0003 11/13/16 1505 11/14/16 0352 11/16/16 0928  NA  --  132* 135 130*  K  --  4.7 3.7 4.5  CL  --  95* 98* 95*  CO2  --  22 29 24   GLUCOSE  --  180* 150* 136*  BUN  --  52* 22* 43*  CREATININE  --  5.85* 3.41* 5.67*  CALCIUM  --  8.7* 8.6* 9.0  PHOS 3.9 6.1*  --  4.3    Liver Function Tests:  Recent Labs Lab 11/13/16 1505 11/16/16 0928  ALBUMIN 2.8* 2.8*   No results for input(s): LIPASE, AMYLASE in the last 168 hours. No results for input(s): AMMONIA in the last 168 hours.  CBC:  Recent Labs Lab 11/13/16 1505 11/14/16 0352 11/16/16 0928  WBC 7.7 6.4 8.5  HGB 10.3* 10.6* 10.3*  HCT 31.0* 32.1* 31.0*  MCV 92.2 92.9 93.2  PLT 208 199 224    Cardiac Enzymes: No results  for input(s): CKTOTAL, CKMB, CKMBINDEX, TROPONINI in the last 168 hours.  BNP: Invalid input(s): POCBNP  CBG:  Recent Labs Lab 11/17/16 1143 11/17/16 1652 11/17/16 2124 11/18/16 0809 11/18/16 1141  GLUCAP 115* 164* 131* 124* 141*    Microbiology: Results for orders placed or performed during the hospital encounter of 11/11/16  Blood culture (routine x 2)     Status: None   Collection Time: 11/11/16  9:45 AM  Result Value Ref Range Status   Specimen Description BLOOD L AC  Final   Special Requests   Final    BOTTLES DRAWN AEROBIC AND ANAEROBIC Blood Culture adequate volume   Culture NO GROWTH 5 DAYS  Final   Report Status 11/16/2016 FINAL  Final  Blood culture (routine x 2)     Status: None   Collection Time: 11/11/16 10:00 AM  Result Value Ref Range Status   Specimen Description BLOOD L FA  Final   Special Requests   Final    BOTTLES DRAWN AEROBIC AND ANAEROBIC Blood Culture adequate volume   Culture NO GROWTH 5 DAYS  Final   Report Status 11/16/2016 FINAL  Final  MRSA PCR Screening     Status: None   Collection Time: 11/12/16 12:49 AM  Result  Value Ref Range Status   MRSA by PCR NEGATIVE NEGATIVE Final    Comment:        The GeneXpert MRSA Assay (FDA approved for NASAL specimens only), is one component of a comprehensive MRSA colonization surveillance program. It is not intended to diagnose MRSA infection nor to guide or monitor treatment for MRSA infections.     Coagulation Studies:  Recent Labs  11/16/16 0759 11/17/16 0538 11/18/16 0542  LABPROT 33.1* 26.2* 22.7*  INR 3.16 2.36 1.97    Urinalysis: No results for input(s): COLORURINE, LABSPEC, PHURINE, GLUCOSEU, HGBUR, BILIRUBINUR, KETONESUR, PROTEINUR, UROBILINOGEN, NITRITE, LEUKOCYTESUR in the last 72 hours.  Invalid input(s): APPERANCEUR    Imaging: No results found.   Medications:   . vancomycin Stopped (11/16/16 1533)   . calcium acetate  1,334 mg Oral TID WC  . diltiazem  120 mg  Oral QHS  . docusate sodium  100 mg Oral BID  . epoetin (EPOGEN/PROCRIT) injection  10,000 Units Intravenous Q M,W,F-HD  . famotidine  10 mg Oral Daily  . fentaNYL  25 mcg Transdermal Q72H  . insulin aspart  0-9 Units Subcutaneous TID WC  . lidocaine  1 patch Transdermal Q24H  . multivitamin  1 tablet Oral Daily  . omega-3 acid ethyl esters  1,000 mg Oral BID  . PARoxetine  40 mg Oral QHS  . polyethylene glycol  17 g Oral Daily  . pravastatin  20 mg Oral QPM  . warfarin  2.5 mg Oral q1800  . Warfarin - Pharmacist Dosing Inpatient   Does not apply q1800   acetaminophen **OR** acetaminophen, albuterol, cyclobenzaprine, haloperidol lactate, ondansetron **OR** ondansetron (ZOFRAN) IV, oxyCODONE, traMADol  Assessment/ Plan:  Ms. Helane RimaJoann Y Fullbright is a 83 y.o. white female with atrial fibrillation, hypertension, gout, insulin-dependent diabetes mellitus type 2, diabetic neuropathy, CVA with right visual field blindness, hyperlipidemia, major depressive disorder, overactive bladder with incontinence, generalized anxiety disorder, congestive heart failure diastolic, parathyroidectomy, and osteoarthritis. Currently with discitis  MWF [facility] [address]   1. End Stage Renal Disease: MWF. Hemodialysis. Next treatment will need to be done in chair - Continue MWF schedule. Try to sit in chair   - Next HD later today  2. Diskitis :  - Appreciate ID input. Neurosurgery consulted 6/14. - not a surgical candidate - IV vancomycin restarted - Lidocaine patch for local anesthetic effect  3. Secondary Hyperparathyroidism:   - calcium acetate   4. Anemia of chronic kidney disease: hemoglobin 10.3 - epo with HD treatment.    LOS: 7 Shawonda Kerce 6/20/20181:26 PM

## 2016-11-18 NOTE — Progress Notes (Signed)
Hemodialysis completed. 

## 2016-11-18 NOTE — Progress Notes (Signed)
F/u with patient. Intermittent confusion but seems less confused than yesterday. Denies pain during assessment. Brace at bedside but patient requests her daughter bring a brace she has from home. Strongly encouraged she wear brace today at dialysis because she needs to sit up.   F/u with daughter, [name] via telephone. Requested she bring brace from home for her mother to wear. Plan remains for patient to attempt HD today sitting up with brace. If she does not tolerate sitting up today, daughter understands we may have to change goals of care and consider hospice services.   NO CHARGE  Vennie HomansMegan Osten Janek, FNP-C Palliative Medicine Team  Phone: [phone] Fax: [phone]

## 2016-11-18 NOTE — Progress Notes (Signed)
ANTICOAGULATION CONSULT NOTE -Follow Up  Pharmacy Consult for Warfarin dosing Indication: atrial fibrillation  Allergies  Allergen Reactions  . Codeine Itching and Other (See Comments)    Reaction:  Confusion/Hallucinations   . Contrast Media [Iodinated Diagnostic Agents] Other (See Comments)    "skin peel" breaks into a blistered rash, then peel. If given Benadryl and solumedrol she will be okay  . Oxycodone Other (See Comments)    Reaction:  Confusion/Hallucinations   . Quinine Derivatives Rash  . Tape Other (See Comments)    Skin is very thin and will blister and pull off skin with any tape except paper tape.  PLEASE USE PAPER TAPE.    Patient Measurements: Height: 5\' 5"  (165.1 cm) Weight: 163 lb 2.3 oz (74 kg) IBW/kg (Calculated) : 57  Vital Signs: Temp: 98.5 F (36.9 C) (06/20 0502) Temp Source: Oral (06/20 0502) BP: 158/65 (06/20 0502) Pulse Rate: 83 (06/20 0502)  Recent Labs  11/16/16 0759 11/16/16 0928 11/17/16 0538 11/18/16 0542  HGB  --  10.3*  --   --   HCT  --  31.0*  --   --   PLT  --  224  --   --   LABPROT 33.1*  --  26.2* 22.7*  INR 3.16  --  2.36 1.97  CREATININE  --  5.67*  --   --     Estimated Creatinine Clearance: 7.8 mL/min (A) (by C-G formula based on SCr of 5.67 mg/dL (H)).   Medical History: Past Medical History:  Diagnosis Date  . Anemia   . Anxiety   . Arthritis    gout  . Atrial fibrillation (HCC)   . CHF (congestive heart failure) (HCC)    has had this off and on prior to dialysis but has had several times since  . Chronic kidney disease    end stage renal insufficiency.  on dialysis M,W,F  . Diabetes mellitus without complication (HCC)   . Dialysis patient [facility])    Mon.- Wed- Fri.  . Dysrhythmia    afib/flutter  . GERD (gastroesophageal reflux disease)   . Hypertension   . 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 Peripheral vascular disease (HCC)   . Pleural effusion   . Pulmonary hypertension (HCC)   .  Renal insufficiency   . Restless leg syndrome   . Shortness of breath dyspnea    with exertion  . Stroke (HCC) 06/2015   has had 1 other stroke. first one affected vision. the most recent one affected right arm   Assessment: 83 yo female with PMH of A.Fib, admitted with Discitis. Pharmacy consulted for warfarin dosing and monitoring. INR on admission 2.46.  Home Regimen: warfarin 2.5mg  QD  DATE  INR  DOSE 6/13  2.46  2.5mg  6/14  2.55  2.5 mg 6/15  2.64  2.5 mg 6/16                 2.53                 2.5 mg 6/17                 2.78                 2.5 mg 6/18  3.16  2 mg 6/19  2.36  2 mg 6/20  1.97  2.5 mg  Goal of Therapy:  INR 2-3 Monitor platelets by anticoagulation protocol: Yes   Plan:  INR subtherapeutic  today. Will increase back to 2.5 mg po daily and follow INR daily while on ABX.   Carola FrostNathan A Kimba Lottes, Pharm.D., BCPS Clinical Pharmacist 11/18/2016 9:28 AM

## 2016-11-19 LAB — PROTIME-INR
INR: 1.88
Prothrombin Time: 21.9 seconds — ABNORMAL HIGH (ref 11.4–15.2)

## 2016-11-19 LAB — GLUCOSE, CAPILLARY
GLUCOSE-CAPILLARY: 134 mg/dL — AB (ref 65–99)
GLUCOSE-CAPILLARY: 114 mg/dL — AB (ref 65–99)

## 2016-11-19 MED ORDER — SENNA 8.6 MG PO TABS: 2.0000 | ORAL_TABLET | Freq: Two times a day (BID) | ORAL | Status: DC

## 2016-11-19 MED ORDER — OXYCODONE HCL 5 MG PO TABS: 5.0000 mg | ORAL_TABLET | ORAL | 0 refills | Status: DC | PRN

## 2016-11-19 MED ORDER — LACTULOSE 10 GM/15ML PO SOLN
30.0000 g | Freq: Once | ORAL | Status: AC
  Administered 2016-11-19: 30 g via ORAL
  Filled 2016-11-19: qty 60

## 2016-11-19 MED ORDER — TRAMADOL HCL 50 MG PO TABS: 50.0000 mg | ORAL_TABLET | Freq: Four times a day (QID) | ORAL | 0 refills | Status: DC | PRN

## 2016-11-19 MED ORDER — CYCLOBENZAPRINE HCL 5 MG PO TABS: 5.0000 mg | ORAL_TABLET | Freq: Three times a day (TID) | ORAL | 0 refills | Status: AC | PRN

## 2016-11-19 MED ORDER — FLEET ENEMA 7-19 GM/118ML RE ENEM: 1.0000 | ENEMA | Freq: Once | RECTAL | Status: DC

## 2016-11-19 MED ORDER — LIDOCAINE 5 % EX PTCH: 1.0000 | MEDICATED_PATCH | CUTANEOUS | 0 refills | Status: AC

## 2016-11-19 MED ORDER — SENNOSIDES-DOCUSATE SODIUM 8.6-50 MG PO TABS: 2.0000 | ORAL_TABLET | Freq: Two times a day (BID) | ORAL | 0 refills | Status: AC

## 2016-11-19 MED ORDER — SENNOSIDES-DOCUSATE SODIUM 8.6-50 MG PO TABS
2.0000 | ORAL_TABLET | Freq: Two times a day (BID) | ORAL | Status: DC
  Administered 2016-11-19: 2 via ORAL
  Filled 2016-11-19: qty 2

## 2016-11-19 MED ORDER — VANCOMYCIN HCL IN DEXTROSE 1-5 GM/200ML-% IV SOLN: 1000.0000 mg | INTRAVENOUS | 0 refills | Status: DC

## 2016-11-19 NOTE — Progress Notes (Signed)
[facility]  ROUNDING NOTE   Subjective:   Patient states that she sat for the whole dialysis treatment yesterday in the chair Also had BM during dialysis Doing fair at this time  No shortness of breath.  Objective:  Vital signs in last 24 hours:  Temp:  [97.4 F (36.3 C)-98 F (36.7 C)] 98 F (36.7 C) (06/21 1145) Pulse Rate:  [87-120] 110 (06/21 1145) Resp:  [14-31] 16 (06/21 1145) BP: (129-155)/(50-77) 135/71 (06/21 1145) SpO2:  [84 %-99 %] 99 % (06/21 1145)  Weight change:  Filed Weights   11/11/16 1812 11/13/16 1515 11/16/16 0920  Weight: 71.2 kg (156 lb 15.5 oz) 72 kg (158 lb 11.7 oz) 74 kg (163 lb 2.3 oz)    Intake/Output: I/O last 3 completed shifts: In: 0  Out: 1000 [Other:1000]   Intake/Output this shift:  No intake/output data recorded.  Physical Exam: General: NAD,   Head: Normocephalic, atraumatic. Dry oral mucosal membranes  Eyes: Anicteric,  Neck: Supple,   Lungs:  Clear to auscultation  Heart: Irregular rhythm  Abdomen:  Soft, nontender,   Extremities: no peripheral edema.  Neurologic: Nonfocal, moving all four extremities  Skin: Scattered eccymosis  Access: Right AVF    Basic Metabolic Panel:  Recent Labs Lab 11/13/16 1505 11/14/16 0352 11/16/16 0928  NA 132* 135 130*  K 4.7 3.7 4.5  CL 95* 98* 95*  CO2 22 29 24   GLUCOSE 180* 150* 136*  BUN 52* 22* 43*  CREATININE 5.85* 3.41* 5.67*  CALCIUM 8.7* 8.6* 9.0  PHOS 6.1*  --  4.3    Liver Function Tests:  Recent Labs Lab 11/13/16 1505 11/16/16 0928  ALBUMIN 2.8* 2.8*   No results for input(s): LIPASE, AMYLASE in the last 168 hours. No results for input(s): AMMONIA in the last 168 hours.  CBC:  Recent Labs Lab 11/13/16 1505 11/14/16 0352 11/16/16 0928  WBC 7.7 6.4 8.5  HGB 10.3* 10.6* 10.3*  HCT 31.0* 32.1* 31.0*  MCV 92.2 92.9 93.2  PLT 208 199 224    Cardiac Enzymes: No results for input(s): CKTOTAL, CKMB, CKMBINDEX, TROPONINI in the last 168  hours.  BNP: Invalid input(s): POCBNP  CBG:  Recent Labs Lab 11/18/16 0809 11/18/16 1141 11/18/16 1642 11/19/16 0752 11/19/16 1129  GLUCAP 124* 141* 120* 134* 114*    Microbiology: Results for orders placed or performed during the hospital encounter of 11/11/16  Blood culture (routine x 2)     Status: None   Collection Time: 11/11/16  9:45 AM  Result Value Ref Range Status   Specimen Description BLOOD L AC  Final   Special Requests   Final    BOTTLES DRAWN AEROBIC AND ANAEROBIC Blood Culture adequate volume   Culture NO GROWTH 5 DAYS  Final   Report Status 11/16/2016 FINAL  Final  Blood culture (routine x 2)     Status: None   Collection Time: 11/11/16 10:00 AM  Result Value Ref Range Status   Specimen Description BLOOD L FA  Final   Special Requests   Final    BOTTLES DRAWN AEROBIC AND ANAEROBIC Blood Culture adequate volume   Culture NO GROWTH 5 DAYS  Final   Report Status 11/16/2016 FINAL  Final  MRSA PCR Screening     Status: None   Collection Time: 11/12/16 12:49 AM  Result Value Ref Range Status   MRSA by PCR NEGATIVE NEGATIVE Final    Comment:        The GeneXpert MRSA Assay (FDA  approved for NASAL specimens only), is one component of a comprehensive MRSA colonization surveillance program. It is not intended to diagnose MRSA infection nor to guide or monitor treatment for MRSA infections.     Coagulation Studies:  Recent Labs  11/17/16 0538 11/18/16 0542 11/19/16 0454  LABPROT 26.2* 22.7* 21.9*  INR 2.36 1.97 1.88    Urinalysis: No results for input(s): COLORURINE, LABSPEC, PHURINE, GLUCOSEU, HGBUR, BILIRUBINUR, KETONESUR, PROTEINUR, UROBILINOGEN, NITRITE, LEUKOCYTESUR in the last 72 hours.  Invalid input(s): APPERANCEUR    Imaging: No results found.   Medications:   . vancomycin Stopped (11/18/16 1840)   . calcium acetate  1,334 mg Oral TID WC  . diltiazem  120 mg Oral QHS  . epoetin (EPOGEN/PROCRIT) injection  10,000 Units  Intravenous Q M,W,F-HD  . famotidine  10 mg Oral Daily  . insulin aspart  0-9 Units Subcutaneous TID WC  . lidocaine  1 patch Transdermal Q24H  . multivitamin  1 tablet Oral Daily  . omega-3 acid ethyl esters  1,000 mg Oral BID  . PARoxetine  40 mg Oral QHS  . pravastatin  20 mg Oral QPM  . senna-docusate  2 tablet Oral BID  . sodium phosphate  1 enema Rectal Once  . warfarin  2.5 mg Oral q1800  . Warfarin - Pharmacist Dosing Inpatient   Does not apply q1800   acetaminophen **OR** acetaminophen, albuterol, cyclobenzaprine, haloperidol lactate, ondansetron **OR** ondansetron (ZOFRAN) IV, oxyCODONE, traMADol  Assessment/ Plan:  [name] is a 83 y.o. white female with atrial fibrillation, hypertension, gout, insulin-dependent diabetes mellitus type 2, diabetic neuropathy, CVA with right visual field blindness, hyperlipidemia, major depressive disorder, overactive bladder with incontinence, generalized anxiety disorder, congestive heart failure diastolic, parathyroidectomy, and osteoarthritis. Currently with discitis  MWF [facility] [address]   1. End Stage Renal Disease: MWF. Hemodialysis.   - Continue MWF schedule.   - Next HD tomorrow Patient was able to sit for dialysis yesterday in a chair Outpatient, she will need assistance with [facility] lift.   2. Diskitis :  - Appreciate ID input. Neurosurgery consulted 6/14. - not a surgical candidate - IV vancomycin restarted - Lidocaine patch for local anesthetic effect  3. Secondary Hyperparathyroidism:   - calcium acetate   4. Anemia of chronic kidney disease: hemoglobin 10.3 - epo with HD treatment.    LOS: 8 [name] 6/21/20182:23 PM

## 2016-11-19 NOTE — Clinical Social Work Placement (Signed)
   CLINICAL SOCIAL WORK PLACEMENT  NOTE  Date:  11/19/2016  Patient Details  Name: [name] MRN: [id number] Date of Birth: 1934/05/22  Clinical Social Work is seeking post-discharge placement for this patient at the Skilled  Nursing Facility level of care (*CSW will initial, date and re-position this form in  chart as items are completed):  Yes   Patient/family provided with [facility] Clinical Social Work Department's list of facilities offering this level of care within the geographic area requested by the patient (or if unable, by the patient's family).  Yes   Patient/family informed of their freedom to choose among providers that offer the needed level of care, that participate in Medicare, Medicaid or managed care program needed by the patient, have an available bed and are willing to accept the patient.  Yes   Patient/family informed of [facility]'s ownership interest in [facility] and [facility], as well as of the fact that they are under no obligation to receive care at these facilities.  PASRR submitted to EDS on       PASRR number received on       Existing PASRR number confirmed on 11/11/16     FL2 transmitted to all facilities in geographic area requested by pt/family on 11/13/16     FL2 transmitted to all facilities within larger geographic area on       Patient informed that his/her managed care company has contracts with or will negotiate with certain facilities, including the following:        Yes   Patient/family informed of bed offers received.  Patient chooses bed at  [facility])     Physician recommends and patient chooses bed at  [facility](SNF)    Patient to be transferred to  ([facility]) on 11/19/16.  Patient to be transferred to facility by       Patient family notified on 11/19/16 of transfer.  Name of family member notified:  daughter     PHYSICIAN       Additional Comment:     _______________________________________________ York SpanielMonica Zamari Vea, LCSW 11/19/2016, 12:12 PM

## 2016-11-19 NOTE — Progress Notes (Signed)
Primary nurse was informed by Dialysis Nurse that pt sat up in chair for the entire Dialysis treatment.

## 2016-11-19 NOTE — Discharge Summary (Addendum)
5        [facility] -  at [facility]   PATIENT NAME: [name]    MR#:  [id number]  DATE OF BIRTH:  08/24/1933  DATE OF ADMISSION:  11/11/2016   ADMITTING PHYSICIAN: Enedina Finner, MD  DATE OF DISCHARGE: 11/19/2016  PRIMARY CARE PHYSICIAN: [name], MD   ADMISSION DIAGNOSIS:  Uremia [N19] Weakness [R53.1] Acute midline low back pain without sciatica [M54.5] DISCHARGE DIAGNOSIS:  Active Problems:   Discitis   Palliative care by specialist   Goals of care, counseling/discussion  SECONDARY DIAGNOSIS:   Past Medical History:  Diagnosis Date  . Anemia   . Anxiety   . Arthritis    gout  . Atrial fibrillation (HCC)   . CHF (congestive heart failure) (HCC)    has had this off and on prior to dialysis but has had several times since  . Chronic kidney disease    end stage renal insufficiency.  on dialysis M,W,F  . Diabetes mellitus without complication (HCC)   . Dialysis patient [facility])    Mon.- Wed- Fri.  . Dysrhythmia    afib/flutter  . GERD (gastroesophageal reflux disease)   . Hypertension   . 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 Peripheral vascular disease (HCC)   . Pleural effusion   . Pulmonary hypertension (HCC)   . Renal insufficiency   . Restless leg syndrome   . Shortness of breath dyspnea    with exertion  . Stroke (HCC) 06/2015   has had 1 other stroke. first one affected vision. the most recent one affected right arm   HOSPITAL COURSE:  83 year old female with past medical history of diabetes, end-stage renal disease and hemodialysis, history of discitis, chronic atrial fibrillation, CHF, hypertension, GERD, restless leg syndrome admitted to the hospital due to worsening back pain.  1. Severe back pain secondary to discitis/osteomyelitis-continue IV vancomycin, patient will need long-term therapy as per infectious disease for about 12 weeks. -Seen by neurosurgery and not a good surgical candidate presently.   They recommended lumbar spinal orthotic (LSO) from [employer] but patient refused. -Can consider facet joint injection as an outpatient with pain management, If pain continues to be an issue - pain meds adjusted due to delirium/AMS. Off Fentanyl patch.  Cont. Oxycodone/Tramadol, Flexeril PRN and Lidocaine patch. Pt. Refused to wear the back brace but she is agreeable to wear her brace from home.  2. Altered mental status/delirium-secondary to pain medications. Much improved and back to baseline.  - off Fentanyl patch now.   -Consider using as necessary Haldol for agitation.   3. End-stage renal disease on hemodialysis- Monday Wednesday Friday. -She was able to sit up in the chair for dialysis  DISCHARGE CONDITIONS:  fair CONSULTS OBTAINED:  Treatment Team:  Lamont Dowdy, MD Mick Sell, MD [name], MD DRUG ALLERGIES:   Allergies  Allergen Reactions  . Codeine Itching and Other (See Comments)    Reaction:  Confusion/Hallucinations   . Contrast Media [Iodinated Diagnostic Agents] Other (See Comments)    "skin peel" breaks into a blistered rash, then peel. If given Benadryl and solumedrol she will be okay  . Oxycodone Other (See Comments)    Reaction:  Confusion/Hallucinations   . Quinine Derivatives Rash  . Tape Other (See Comments)    Skin is very thin and will blister and pull off skin with any tape except paper tape.  PLEASE USE PAPER TAPE.   DISCHARGE MEDICATIONS:   Allergies  as of 11/19/2016      Reactions   Codeine Itching, Other (See Comments)   Reaction:  Confusion/Hallucinations    Contrast Media [iodinated Diagnostic Agents] Other (See Comments)   "skin peel" breaks into a blistered rash, then peel. If given Benadryl and solumedrol she will be okay   Oxycodone Other (See Comments)   Reaction:  Confusion/Hallucinations    Quinine Derivatives Rash   Tape Other (See Comments)   Skin is very thin and will blister and pull off skin with any tape  except paper tape.  PLEASE USE PAPER TAPE.      Medication List    STOP taking these medications   docusate sodium 100 MG capsule Commonly known as:  COLACE     TAKE these medications   acetaminophen 650 MG CR tablet Commonly known as:  TYLENOL Take 1,300 mg by mouth 2 (two) times daily.   albuterol 108 (90 Base) MCG/ACT inhaler Commonly known as:  PROVENTIL HFA;VENTOLIN HFA Inhale 1 puff into the lungs every 6 (six) hours as needed for wheezing or shortness of breath.   Biotin 1 MG Caps Take 1 mg by mouth daily.   calcium acetate 667 MG tablet Commonly known as:  PHOSLO Take 1,334 mg by mouth 3 (three) times daily with meals. With any food   cyclobenzaprine 5 MG tablet Commonly known as:  FLEXERIL Take 1 tablet (5 mg total) by mouth 3 (three) times daily as needed for muscle spasms.   diltiazem 120 MG 24 hr capsule Commonly known as:  CARDIZEM CD Take 120 mg by mouth at bedtime.   Fish Oil 1000 MG Caps Take 1,000 mg by mouth 2 (two) times daily.   folic acid-vitamin b complex-vitamin c-selenium-zinc 3 MG Tabs tablet Take 1 tablet by mouth daily.   furosemide 80 MG tablet Commonly known as:  LASIX Take 80 mg by mouth 2 (two) times daily.   gabapentin 300 MG capsule Commonly known as:  NEURONTIN Take 300 mg by mouth every Monday, Wednesday, and Friday at 8 PM. At bedtime   gabapentin 100 MG capsule Commonly known as:  NEURONTIN Take 100 mg by mouth every Tuesday, Thursday, Saturday, and Sunday. At bedtime   insulin detemir 100 UNIT/ML injection Commonly known as:  LEVEMIR Inject 0.06 mLs (6 Units total) into the skin 2 (two) times daily. What changed:  how much to take  when to take this   lidocaine 5 % Commonly known as:  LIDODERM Place 1 patch onto the skin daily. Remove & Discard patch within 12 hours or as directed by MD   lidocaine-prilocaine cream Commonly known as:  EMLA Apply 1 application topically as needed (port access). Mon, Wed, Fri  before dialysis   oxyCODONE 5 MG immediate release tablet Commonly known as:  Oxy IR/ROXICODONE Take 1 tablet (5 mg total) by mouth every 4 (four) hours as needed for severe pain.   PARoxetine 40 MG tablet Commonly known as:  PAXIL Take 40 mg by mouth at bedtime.   polyethylene glycol packet Commonly known as:  MIRALAX / GLYCOLAX Take 17 g by mouth as needed.   pravastatin 20 MG tablet Commonly known as:  PRAVACHOL Take 20 mg by mouth every evening.   ranitidine 150 MG tablet Commonly known as:  ZANTAC Take 150 mg by mouth at bedtime.   senna-docusate 8.6-50 MG tablet Commonly known as:  Senokot-S Take 2 tablets by mouth 2 (two) times daily.   traMADol 50 MG tablet Commonly known as:  ULTRAM Take 1 tablet (50 mg total) by mouth every 6 (six) hours as needed for moderate pain.   vancomycin 1-5 GM/200ML-% Soln Commonly known as:  VANCOCIN Inject 200 mLs (1,000 mg total) into the vein every Monday, Wednesday, and Friday with hemodialysis. She will need this for at least 12 weeks, further course to be determined by infectious disease Dr. Sampson GoonFitzgerald Start taking on:  11/20/2016 What changed:  additional instructions   warfarin 2.5 MG tablet Commonly known as:  COUMADIN Take 2.5 mg by mouth daily at 6 PM.        DISCHARGE INSTRUCTIONS:   DIET:  Renal diet DISCHARGE CONDITION:  Good ACTIVITY:  Activity as tolerated OXYGEN:  Home Oxygen: Yes.    Oxygen Delivery: 2 liters via [place] DISCHARGE LOCATION:  nursing home - palliative to follow at facility  If you experience worsening of your admission symptoms, develop shortness of breath, life threatening emergency, suicidal or homicidal thoughts you must seek medical attention immediately by calling 911 or calling your MD immediately  if symptoms less severe.  You Must read complete instructions/literature along with all the possible adverse reactions/side effects for all the Medicines you take and that have been  prescribed to you. Take any new Medicines after you have completely understood and accpet all the possible adverse reactions/side effects.   Please note  You were cared for by a hospitalist during your hospital stay. If you have any questions about your discharge medications or the care you received while you were in the hospital after you are discharged, you can call the unit and asked to speak with the hospitalist on call if the hospitalist that took care of you is not available. Once you are discharged, your primary care physician will handle any further medical issues. Please note that NO REFILLS for any discharge medications will be authorized once you are discharged, as it is imperative that you return to your primary care physician (or establish a relationship with a primary care physician if you do not have one) for your aftercare needs so that they can reassess your need for medications and monitor your lab values.    On the day of Discharge:  VITAL SIGNS:  Blood pressure 130/69, pulse (!) 108, temperature 98 F (36.7 C), temperature source Oral, resp. rate 14, height 5\' 5"  (1.651 m), weight 74 kg (163 lb 2.3 oz), SpO2 95 %. PHYSICAL EXAMINATION:  GENERAL:  83 y.o.-year-old patient lying in the bed with no acute distress.  EYES: Pupils equal, round, reactive to light and accommodation. No scleral icterus. Extraocular muscles intact.  HEENT: Head atraumatic, normocephalic. Oropharynx and nasopharynx clear.  NECK:  Supple, no jugular venous distention. No thyroid enlargement, no tenderness.  LUNGS: Normal breath sounds bilaterally, no wheezing, rales,rhonchi or crepitation. No use of accessory muscles of respiration.  CARDIOVASCULAR: S1, S2 normal. No murmurs, rubs, or gallops.  ABDOMEN: Soft, non-tender, non-distended. Bowel sounds present. No organomegaly or mass.  EXTREMITIES: No pedal edema, cyanosis, or clubbing.  NEUROLOGIC: Cranial nerves II through XII are intact. Muscle strength  5/5 in all extremities. Sensation intact. Gait not checked.  PSYCHIATRIC: The patient is alert and oriented x 3.  SKIN: No obvious rash, lesion, or ulcer.  DATA REVIEW:   CBC  Recent Labs Lab 11/16/16 0928  WBC 8.5  HGB 10.3*  HCT 31.0*  PLT 224    Chemistries   Recent Labs Lab 11/16/16 0928  NA 130*  K 4.5  CL 95*  CO2 24  GLUCOSE 136*  BUN 43*  CREATININE 5.67*  CALCIUM 9.0     Microbiology Results  Results for orders placed or performed during the hospital encounter of 11/11/16  Blood culture (routine x 2)     Status: None   Collection Time: 11/11/16  9:45 AM  Result Value Ref Range Status   Specimen Description BLOOD L AC  Final   Special Requests   Final    BOTTLES DRAWN AEROBIC AND ANAEROBIC Blood Culture adequate volume   Culture NO GROWTH 5 DAYS  Final   Report Status 11/16/2016 FINAL  Final  Blood culture (routine x 2)     Status: None   Collection Time: 11/11/16 10:00 AM  Result Value Ref Range Status   Specimen Description BLOOD L FA  Final   Special Requests   Final    BOTTLES DRAWN AEROBIC AND ANAEROBIC Blood Culture adequate volume   Culture NO GROWTH 5 DAYS  Final   Report Status 11/16/2016 FINAL  Final  MRSA PCR Screening     Status: None   Collection Time: 11/12/16 12:49 AM  Result Value Ref Range Status   MRSA by PCR NEGATIVE NEGATIVE Final    Comment:        The GeneXpert MRSA Assay (FDA approved for NASAL specimens only), is one component of a comprehensive MRSA colonization surveillance program. It is not intended to diagnose MRSA infection nor to guide or monitor treatment for MRSA infections.     RADIOLOGY:  No results found.   Management plans discussed with the patient, family (discussed with her daughter Elita Quick via phone) and they are in agreement.  CODE STATUS: DNR   TOTAL TIME TAKING CARE OF THIS PATIENT: 45 minutes.    Delfino Lovett M.D on 11/19/2016 at 10:37 AM  Between 7am to 6pm - Pager - [phone]  After  6pm go to [email or web address] - Social research officer, government  [facility] [place] [facility]  Office  [phone]  CC: Primary care physician; [name], MD   Note: This dictation was prepared with Dragon dictation along with smaller phrase technology. Any transcriptional errors that result from this process are unintentional.

## 2016-11-19 NOTE — Progress Notes (Signed)
Notified Dr. Sherryll BurgerShah that patient has not yet had BM despite being given multiple medications.  New orders received.

## 2016-11-19 NOTE — Progress Notes (Signed)
Notified Dr. Sherryll BurgerShah of no BM since 6/17.  Getting daily miralax and had 1x dose of lactulose yesterday.

## 2016-11-19 NOTE — Progress Notes (Signed)
Notified daughter of report of BM.  Report called to [place], Charity fundraiserN, at [facility].  Awaiting EMS transport.

## 2016-11-19 NOTE — Clinical Social Work Note (Signed)
Patient was able to sit up for the duration of dialysis yesterday and thus is ready for discharge to [facility]. CSW informed Jomarie LongsJoseph at [facility] and spoke with patient's daughter via phone. Patient's daughter is aware that we are waiting on patient to have a bowel movement prior to discharge today. York SpanielMonica Dshawn Mcnay MSW,LCSW [phone]

## 2016-11-19 NOTE — Progress Notes (Signed)
ANTICOAGULATION CONSULT NOTE -Follow Up  Pharmacy Consult for Warfarin dosing Indication: atrial fibrillation  Allergies  Allergen Reactions  . Codeine Itching and Other (See Comments)    Reaction:  Confusion/Hallucinations   . Contrast Media [Iodinated Diagnostic Agents] Other (See Comments)    "skin peel" breaks into a blistered rash, then peel. If given Benadryl and solumedrol she will be okay  . Oxycodone Other (See Comments)    Reaction:  Confusion/Hallucinations   . Quinine Derivatives Rash  . Tape Other (See Comments)    Skin is very thin and will blister and pull off skin with any tape except paper tape.  PLEASE USE PAPER TAPE.    Patient Measurements: Height: 5\' 5"  (165.1 cm) Weight:  (Patient came down in wheelchair.not steady enought to stand.) IBW/kg (Calculated) : 57  Vital Signs: Temp: 98 F (36.7 C) (06/21 0506) Temp Source: Oral (06/21 0506) BP: 130/69 (06/21 0506) Pulse Rate: 108 (06/21 0620)  Recent Labs  11/16/16 0928 11/17/16 0538 11/18/16 0542 11/19/16 0454  HGB 10.3*  --   --   --   HCT 31.0*  --   --   --   PLT 224  --   --   --   LABPROT  --  26.2* 22.7* 21.9*  INR  --  2.36 1.97 1.88  CREATININE 5.67*  --   --   --     Estimated Creatinine Clearance: 7.8 mL/min (A) (by C-G formula based on SCr of 5.67 mg/dL (H)).   Medical History: Past Medical History:  Diagnosis Date  . Anemia   . Anxiety   . Arthritis    gout  . Atrial fibrillation (HCC)   . CHF (congestive heart failure) (HCC)    has had this off and on prior to dialysis but has had several times since  . Chronic kidney disease    end stage renal insufficiency.  on dialysis M,W,F  . Diabetes mellitus without complication (HCC)   . Dialysis patient [facility](HCC)    Mon.- Wed- Fri.  . Dysrhythmia    afib/flutter  . GERD (gastroesophageal reflux disease)   . Hypertension   . 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 Peripheral vascular disease (HCC)   . Pleural effusion    . Pulmonary hypertension (HCC)   . Renal insufficiency   . Restless leg syndrome   . Shortness of breath dyspnea    with exertion  . Stroke (HCC) 06/2015   has had 1 other stroke. first one affected vision. the most recent one affected right arm   Assessment: 83 yo female with PMH of A.Fib, admitted with Discitis. Pharmacy consulted for warfarin dosing and monitoring. INR on admission 2.46.  Home Regimen: warfarin 2.5mg  QD  DATE  INR  DOSE 6/13  2.46  2.5mg  6/14  2.55  2.5 mg 6/15  2.64  2.5 mg 6/16                 2.53                 2.5 mg 6/17                 2.78                 2.5 mg 6/18  3.16  2 mg 6/19  2.36  2 mg 6/20  1.97  2.5 mg 6/21  1.88  2.5 mg  Goal of Therapy:  INR 2-3 Monitor platelets  by anticoagulation protocol: Yes   Plan:  INR subtherapeutic today. Will continue 2.5 mg po daily and follow INR daily.  [name], Pharm.D., BCPS Clinical Pharmacist 11/19/2016 8:13 AM

## 2016-11-25 ENCOUNTER — Ambulatory Visit: Payer: Medicare Other | Admitting: Internal Medicine

## 2016-11-27 ENCOUNTER — Emergency Department: Payer: Medicare Other

## 2016-11-27 ENCOUNTER — Encounter: Payer: Self-pay | Admitting: Emergency Medicine

## 2016-11-27 ENCOUNTER — Emergency Department
Admission: EM | Admit: 2016-11-27 | Discharge: 2016-11-27 | Disposition: A | Discharge status: Other Acute Inpatient Hospital | Payer: Medicare Other | Attending: Emergency Medicine | Admitting: Emergency Medicine

## 2016-11-27 DIAGNOSIS — R1030 Lower abdominal pain, unspecified: Secondary | ICD-10-CM | POA: Diagnosis not present

## 2016-11-27 DIAGNOSIS — Z992 Dependence on renal dialysis: Secondary | ICD-10-CM | POA: Diagnosis not present

## 2016-11-27 DIAGNOSIS — Z7901 Long term (current) use of anticoagulants: Secondary | ICD-10-CM | POA: Insufficient documentation

## 2016-11-27 DIAGNOSIS — Z8673 Personal history of transient ischemic attack (TIA), and cerebral infarction without residual deficits: Secondary | ICD-10-CM | POA: Diagnosis not present

## 2016-11-27 DIAGNOSIS — I2510 Atherosclerotic heart disease of native coronary artery without angina pectoris: Secondary | ICD-10-CM | POA: Diagnosis not present

## 2016-11-27 DIAGNOSIS — N186 End stage renal disease: Secondary | ICD-10-CM | POA: Insufficient documentation

## 2016-11-27 DIAGNOSIS — Z794 Long term (current) use of insulin: Secondary | ICD-10-CM | POA: Insufficient documentation

## 2016-11-27 DIAGNOSIS — Z885 Allergy status to narcotic agent status: Secondary | ICD-10-CM | POA: Diagnosis not present

## 2016-11-27 DIAGNOSIS — E1140 Type 2 diabetes mellitus with diabetic neuropathy, unspecified: Secondary | ICD-10-CM | POA: Insufficient documentation

## 2016-11-27 DIAGNOSIS — I132 Hypertensive heart and chronic kidney disease with heart failure and with stage 5 chronic kidney disease, or end stage renal disease: Secondary | ICD-10-CM | POA: Insufficient documentation

## 2016-11-27 DIAGNOSIS — L231 Allergic contact dermatitis due to adhesives: Secondary | ICD-10-CM | POA: Diagnosis not present

## 2016-11-27 DIAGNOSIS — I509 Heart failure, unspecified: Secondary | ICD-10-CM | POA: Diagnosis not present

## 2016-11-27 DIAGNOSIS — M545 Low back pain: Secondary | ICD-10-CM | POA: Diagnosis present

## 2016-11-27 DIAGNOSIS — M4640 Discitis, unspecified, site unspecified: Secondary | ICD-10-CM

## 2016-11-27 LAB — COMPREHENSIVE METABOLIC PANEL
ALT: 16 U/L (ref 14–54)
AST: 27 U/L (ref 15–41)
Albumin: 3.2 g/dL — ABNORMAL LOW (ref 3.5–5.0)
Alkaline Phosphatase: 93 U/L (ref 38–126)
Anion gap: 10 (ref 5–15)
BILIRUBIN TOTAL: 0.9 mg/dL (ref 0.3–1.2)
BUN: 13 mg/dL (ref 6–20)
CHLORIDE: 94 mmol/L — AB (ref 101–111)
CO2: 27 mmol/L (ref 22–32)
CREATININE: 2.26 mg/dL — AB (ref 0.44–1.00)
Calcium: 9 mg/dL (ref 8.9–10.3)
GFR calc Af Amer: 22 mL/min — ABNORMAL LOW (ref >60)
GFR, EST NON AFRICAN AMERICAN: 19 mL/min — AB (ref >60)
GLUCOSE: 91 mg/dL (ref 65–99)
Potassium: 2.9 mmol/L — ABNORMAL LOW (ref 3.5–5.1)
Sodium: 131 mmol/L — ABNORMAL LOW (ref 135–145)
Total Protein: 7.3 g/dL (ref 6.5–8.1)

## 2016-11-27 LAB — CBC
HCT: 36.8 % (ref 35.0–47.0)
Hemoglobin: 12.3 g/dL (ref 12.0–16.0)
MCH: 31 pg (ref 26.0–34.0)
MCHC: 33.6 g/dL (ref 32.0–36.0)
MCV: 92.2 fL (ref 80.0–100.0)
PLATELETS: 282 10*3/uL (ref 150–440)
RBC: 3.99 MIL/uL (ref 3.80–5.20)
RDW: 18.3 % — AB (ref 11.5–14.5)
WBC: 7.5 10*3/uL (ref 3.6–11.0)

## 2016-11-27 LAB — LIPASE, BLOOD: Lipase: 18 U/L (ref 11–51)

## 2016-11-27 MED ORDER — BARIUM SULFATE 2.1 % PO SUSP
450.0000 mL | ORAL | Status: AC
  Administered 2016-11-27 (×2): 450 mL via ORAL

## 2016-11-27 MED ORDER — POTASSIUM CHLORIDE CRYS ER 20 MEQ PO TBCR
20.0000 meq | EXTENDED_RELEASE_TABLET | Freq: Once | ORAL | Status: AC
  Administered 2016-11-27: 20 meq via ORAL
  Filled 2016-11-27: qty 1

## 2016-11-27 MED ORDER — MORPHINE SULFATE (PF) 2 MG/ML IV SOLN
INTRAVENOUS | Status: AC
  Administered 2016-11-27: 2 mg via INTRAVENOUS
  Filled 2016-11-27: qty 1

## 2016-11-27 MED ORDER — MORPHINE SULFATE (PF) 2 MG/ML IV SOLN
2.0000 mg | Freq: Once | INTRAVENOUS | Status: AC
Start: 2016-11-27 — End: 2016-11-27
  Administered 2016-11-27: 2 mg via INTRAVENOUS

## 2016-11-27 NOTE — ED Provider Notes (Signed)
[facility] Emergency Department Provider Note  ____________________________________________   I have reviewed the triage vital signs and the nursing notes.   HISTORY  Chief Complaint Back Pain and Abdominal Pain   History limited by: Not Limited   HPI [name] is a 83 y.o. female who presents to the emergency department today because of concerns for abdominal pain. Is located in the lower abdomen. It started while the patient was getting dialysis. She describes it as sharp. It has been constant since then. It is severe. It has been accompanied by nausea and dry gagging. Patient has had constipation recently. She denies any fevers. She has recently been treated for discitis.    Past Medical History:  Diagnosis Date  . Anemia   . Anxiety   . Arthritis    gout  . Atrial fibrillation (HCC)   . CHF (congestive heart failure) (HCC)    has had this off and on prior to dialysis but has had several times since  . Chronic kidney disease    end stage renal insufficiency.  on dialysis M,W,F  . Diabetes mellitus without complication (HCC)   . Dialysis patient [facility])    Mon.- Wed- Fri.  . Dysrhythmia    afib/flutter  . GERD (gastroesophageal reflux disease)   . Hypertension   . 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 Peripheral vascular disease (HCC)   . Pleural effusion   . Pulmonary hypertension (HCC)   . Renal insufficiency   . Restless leg syndrome   . Shortness of breath dyspnea    with exertion  . Stroke (HCC) 06/2015   has had 1 other stroke. first one affected vision. the most recent one affected right arm    Patient Active Problem List   Diagnosis Date Noted  . Palliative care by specialist   . Goals of care, counseling/discussion   . Lower back pain 08/17/2016  . Hyponatremia 08/17/2016  . Hyperkalemia 08/17/2016  . Heel ulcer (HCC) 08/10/2016  . Discitis 08/09/2016  . Atherosclerosis of native arteries of the  extremities with ulceration (HCC) 04/14/2016  . Kidney dialysis as the cause of abnormal reaction of the patient, or of later complication, without mention of misadventure at the time of the procedure (CODE) 03/03/2016  . Pressure injury of skin 02/18/2016  . Osteomyelitis (HCC) 02/14/2016  . Hematoma 10/12/2015  . Leg pain, right 09/14/2015  . PVD (peripheral vascular disease) with claudication (HCC) 09/14/2015  . Pneumonia 08/13/2015  . Carotid stenosis 07/26/2015  . Amputation of fifth toe, left, traumatic (HCC) 06/24/2015  . Diabetes mellitus (HCC) 06/23/2015  . Diabetic neuropathy (HCC) 06/23/2015  . Chronic atrial fibrillation (HCC) 06/23/2015  . Left carotid artery stenosis 06/23/2015  . Cerebral infarction (HCC) 06/21/2015  . Pressure ulcer 06/19/2015  . ESRD on dialysis (HCC) 06/18/2015  . Respiratory distress 12/11/2014    Past Surgical History:  Procedure Laterality Date  . AMPUTATION Right 11/07/2015   Procedure: AMPUTATION DIGIT ( 4TH TOE, RIGHT FOOT );  Surgeon: Annice Needy, MD;  Location: [facility] ORS;  Service: Vascular;  Laterality: Right;  . AMPUTATION TOE Left 06/07/2015   Procedure: AMPUTATION TOE;  Surgeon: Gwyneth Revels, DPM;  Location: [facility] ORS;  Service: Podiatry;  Laterality: Left;  . CARDIAC CATHETERIZATION    . CATARACT EXTRACTION, BILATERAL Bilateral   . CHOLECYSTECTOMY    . DILATION AND CURETTAGE OF UTERUS    . ENDARTERECTOMY Left 07/26/2015   Procedure: ENDARTERECTOMY CAROTID;  Surgeon:  Renford DillsGregory G Schnier, MD;  Location: [facility] ORS;  Service: Vascular;  Laterality: Left;  . EYE SURGERY    . FISTULAGRAM ([facility] HX)    . HEEL DECUBITUS ULCER EXCISION Right 05/13/2016  . INCISION AND DRAINAGE ABSCESS Right 02/16/2016   Procedure: INCISION AND DRAINAGE ABSCESS and application wound vac;  Surgeon: Bertram DenverMiechia A Esco, MD;  Location: [facility] ORS;  Service: Urology;  Laterality: Right;  . IR GENERIC HISTORICAL  08/14/2016   IR FLUORO GUIDED NEEDLE PLC ASPIRATION/INJECTION LOC  08/14/2016 [facility]-INTERV RAD  . JOINT REPLACEMENT     bilateral hip replacement  . LOWER EXTREMITY ANGIOGRAPHY Right 10/29/2016   Procedure: Lower Extremity Angiography;  Surgeon: Annice Needyew, Jason S, MD;  Location: [facility] INVASIVE CV LAB;  Service: Cardiovascular;  Laterality: Right;  . LOWER EXTREMITY INTERVENTION  10/29/2016   Procedure: Lower Extremity Intervention;  Surgeon: Annice Needyew, Jason S, MD;  Location: [facility] INVASIVE CV LAB;  Service: Cardiovascular;;  . MEDTRONIC BLADDER INTERSTEM     currently turned off for MRI in January  . PARATHYROIDECTOMY    . PERIPHERAL VASCULAR CATHETERIZATION N/A 10/08/2014   Procedure: A/V Shuntogram/Fistulagram;  Surgeon: Annice NeedyJason S Dew, MD;  Location: [facility] INVASIVE CV LAB;  Service: Cardiovascular;  Laterality: N/A;  . PERIPHERAL VASCULAR CATHETERIZATION N/A 03/04/2015   Procedure: A/V Shuntogram/Fistulagram;  Surgeon: Annice NeedyJason S Dew, MD;  Location: [facility] INVASIVE CV LAB;  Service: Cardiovascular;  Laterality: N/A;  . PERIPHERAL VASCULAR CATHETERIZATION N/A 03/04/2015   Procedure: A/V Shunt Intervention;  Surgeon: Annice NeedyJason S Dew, MD;  Location: [facility] INV[facility]IVE CV LAB;  Service: Cardiovascular;  Laterality: N/A;  . PERIPHERAL VASCULAR CATHETERIZATION N/A 09/16/2015   Procedure: Lower Extremity Angiography;  Surgeon: Annice NeedyJason S Dew, MD;  Location: [facility] IN[facility];  Service: Cardiovascular;  Laterality: N/A;  . PERIPHERAL VASCULAR CATHETERIZATION  09/16/2015   Procedure: Lower Extremity Intervention;  Surgeon: Annice NeedyJason S Dew, MD;  Location: [facility];  Service: Cardiovascular;;  . PERIPHERAL VASCULAR CATHETERIZATION Right 04/27/2016   Procedure: A/V Shuntogram/Fistulagram;  Surgeon: Annice NeedyJason S Dew, MD;  Location: [facility] IN[facility];  Service: Cardiovascular;  Laterality: Right;  . PERIPHERAL VASCULAR CATHETERIZATION Left 05/14/2016   Procedure: DIALYSIS/PERMA CATHETER INSERTION;  Surgeon: Annice NeedyJason S Dew, MD;  Location: [facility] ORS;  Service: Vascular;  Laterality: Left;  . PERIPHERAL  VASCULAR CATHETERIZATION N/A 06/08/2016   Procedure: Dialysis/Perma Catheter Removal;  Surgeon: Annice NeedyJason S Dew, MD;  Location: [facility] INV[facility]IVE CV LAB;  Service: Cardiovascular;  Laterality: N/A;  . REVISON OF ARTERIOVENOUS FISTULA Right 05/14/2016   Procedure: REVISON OF ARTERIOVENOUS FISTULA ( WITH ARTEGRAFT );  Surgeon: Annice NeedyJason S Dew, MD;  Location: [facility] ORS;  Service: Vascular;  Laterality: Right;  . THYROIDECTOMY, PARTIAL    . TONSILLECTOMY    . WOUND DEBRIDEMENT Right 11/07/2015   Procedure: DEBRIDEMENT WOUND ( RIGHT HEEL AND ABDOMINAL WOUND (2) );  Surgeon: Annice NeedyJason S Dew, MD;  Location: [facility] ORS;  Service: Vascular;  Laterality: Right;  . WOUND DEBRIDEMENT N/A 11/28/2015   Procedure: DEBRIDEMENT WOUND ( ABDOMINAL );  Surgeon: Annice NeedyJason S Dew, MD;  Location: [facility] ORS;  Service: Vascular;  Laterality: N/A;    Prior to Admission medications   Medication Sig Start Date End Date Taking? Authorizing Provider  acetaminophen (TYLENOL) 650 MG CR tablet Take 1,300 mg by mouth 2 (two) times daily.    [provider]  albuterol (PROVENTIL HFA;VENTOLIN HFA) 108 (90 Base) MCG/ACT inhaler Inhale 1 puff into the lungs every 6 (six) hours as needed for wheezing or shortness of  breath.     [provider]  Biotin 1 MG CAPS Take 1 mg by mouth daily.     [provider]  calcium acetate (PHOSLO) 667 MG tablet Take 1,334 mg by mouth 3 (three) times daily with meals. With any food    [provider]  cyclobenzaprine (FLEXERIL) 5 MG tablet Take 1 tablet (5 mg total) by mouth 3 (three) times daily as needed for muscle spasms. 11/19/16   Delfino Lovett, MD  diltiazem (CARDIZEM CD) 120 MG 24 hr capsule Take 120 mg by mouth at bedtime.  10/15/16   [provider]  folic acid-vitamin b complex-vitamin c-selenium-zinc (DIALYVITE) 3 MG TABS tablet Take 1 tablet by mouth daily.    [provider]  furosemide (LASIX) 80 MG tablet Take 80 mg by mouth 2 (two) times daily.    [provider]  gabapentin (NEURONTIN) 100 MG capsule Take 100 mg by mouth every Tuesday, Thursday, Saturday, and Sunday. At bedtime    [provider]  gabapentin (NEURONTIN) 300 MG capsule Take 300 mg by mouth every Monday, Wednesday, and Friday at 8 PM. At bedtime    [provider]  insulin detemir (LEVEMIR) 100 UNIT/ML injection Inject 0.06 mLs (6 Units total) into the skin 2 (two) times daily. Patient taking differently: Inject 10 Units into the skin at bedtime.  02/18/16   Adrian Saran, MD  lidocaine (LIDODERM) 5 % Place 1 patch onto the skin daily. Remove & Discard patch within 12 hours or as directed by MD 11/19/16   Delfino Lovett, MD  lidocaine-prilocaine (EMLA) cream Apply 1 application topically as needed (port access). Mon, Wed, Fri before dialysis    [provider]  Omega-3 Fatty Acids (FISH OIL) 1000 MG CAPS Take 1,000 mg by mouth 2 (two) times daily.    [provider]  oxyCODONE (OXY IR/ROXICODONE) 5 MG immediate release tablet Take 1 tablet (5 mg total) by mouth every 4 (four) hours as needed for severe pain. 11/19/16   Delfino Lovett, MD  PARoxetine (PAXIL) 40 MG tablet Take 40 mg by mouth at bedtime.     [provider]  polyethylene glycol (MIRALAX / GLYCOLAX) packet Take 17 g by mouth as needed.     [provider]  pravastatin (PRAVACHOL) 20 MG tablet Take 20 mg by mouth every evening.     [provider]  ranitidine (ZANTAC) 150 MG tablet Take 150 mg by mouth at bedtime.     [provider]  senna-docusate (SENOKOT-S) 8.6-50 MG tablet Take 2 tablets by mouth 2 (two) times daily. 11/19/16   Delfino Lovett, MD  traMADol (ULTRAM) 50 MG tablet Take 1 tablet (50 mg total) by mouth every 6 (six) hours as needed for moderate pain. 11/19/16   Delfino Lovett, MD  vancomycin (VANCOCIN) 1-5 GM/200ML-% SOLN Inject 200 mLs (1,000 mg total) into the vein every Monday, Wednesday, and Friday with hemodialysis. She will need this for at  least 12 weeks, further course to be determined by infectious disease Dr. Sampson Goon 11/20/16   Delfino Lovett, MD  warfarin (COUMADIN) 2.5 MG tablet Take 2.5 mg by mouth daily at 6 PM.     [provider]    Allergies Codeine; Contrast media [iodinated diagnostic agents]; Oxycodone; Quinine derivatives; and Tape  Family History  Problem Relation Age of Onset  . Colon cancer Mother   . Stroke Father   . Diabetes Brother     Social History Social History  Substance Use  Topics  . Smoking status: Never Smoker  . Smokeless tobacco: Never Used  . Alcohol use No    Review of Systems Constitutional: No fever/chills Eyes: No visual changes. ENT: No sore throat. Cardiovascular: Denies chest pain. Respiratory: Denies shortness of breath. Gastrointestinal: Positive for lower abdominal pain Genitourinary: Negative for dysuria. Musculoskeletal: Positive for back pain Skin: Negative for rash. Neurological: Negative for headaches, focal weakness or numbness.  ____________________________________________   PHYSICAL EXAM:  VITAL SIGNS: ED Triage Vitals [11/27/16 1549]  Enc Vitals Group     BP 155/77     Pulse 95     Resp 16     Temp 98.1     Temp src      SpO2 100     Weight      Height      Head Circumference      [facility] Flow      Pain Score 8    Constitutional: Alert and oriented.  Eyes: Conjunctivae are normal.  ENT   Head: Normocephalic and atraumatic.   Nose: No congestion/rhinnorhea.   Mouth/Throat: Mucous membranes are moist.   Neck: No stridor. Hematological/Lymphatic/Immunilogical: No cervical lymphadenopathy. Cardiovascular: Normal rate, regular rhythm.  No murmurs, rubs, or gallops.  Respiratory: Normal respiratory effort without tachypnea nor retractions. Breath sounds are clear and equal bilaterally. No wheezes/rales/rhonchi. Gastrointestinal: Soft and minimally tender in the lower abdominal region. No rebound. No guarding.  Rectal: Fecal  impaction noted in rectal vault.  Musculoskeletal: Normal range of motion in all extremities. No lower extremity edema. Neurologic:  Normal speech and language. No gross focal neurologic deficits are appreciated.  Skin:  Skin is warm, dry and intact. No rash noted. Psychiatric: Mood and affect are normal. Speech and behavior are normal. Patient exhibits appropriate insight and judgment.  ____________________________________________    LABS (pertinent positives/negatives)  CBC 7.5 Sodium 131 K 2.9 Creatinine 2.26  ____________________________________________   EKG  None  ____________________________________________    RADIOLOGY  CT abd/pel IMPRESSION:  1. No acute intra-abdominopelvic process by noncontrast CT.  2. Worsening L2-3 discitis osteomyelitis.  3. 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  4. Thirteen month stability RIGHT middle lobe pulmonary nodules.  Recommend follow-up CT chest in 1 year to complete standard  recommendation for 2 year stability.  Aortic Atherosclerosis (ICD10-I70.0).   ____________________________________________   PROCEDURES  Procedures  ------------------------------------------------------------------------------------------------------------------- Fecal Disimpaction Procedure Note:  Performed by me:  Patient placed in the lateral recumbent position with knees drawn towards chest. Nurse present for patient support. Large amount of brown stool removed. No complications during procedure.   ------------------------------------------------------------------------------------------------------------------   ____________________________________________   INITIAL IMPRESSION / ASSESSMENT AND PLAN / ED COURSE  Pertinent labs & imaging results that were available during my care of the patient were reviewed by me and considered in my medical decision making (see chart for details).  Patient presented to the emergency department today  because of concerns for abdominal pain and low back pain. Patient recent admission for discitis. She has been discharged for about a week and has been getting vancomycin with her dialysis appointments. In terms of her abdominal pain she did have a fecal impaction which I manually disimpacted and an enema was performed with large amount of stool removed. CT abd/pel did not show any concerning findings in the abdomin. The patient's back however was concerning for worsening discitis. Discussed with ID who recommended IR aspiration. Discussed with IR on call. Plan to transfer to [facility].  ____________________________________________   FINAL CLINICAL IMPRESSION(S) /  ED DIAGNOSES  Final diagnoses:  Discitis, unspecified spinal region     Note: This dictation was prepared with Dragon dictation. Any transcriptional errors that result from this process are unintentional     Phineas Semen, MD 11/28/16 1700

## 2016-11-27 NOTE — ED Notes (Signed)
Pt reports she does not want anymore pain medication at this time despite also reporting a pain of 8/10. Pt also reports understanding that she can request pain medication if needed.

## 2016-11-27 NOTE — ED Triage Notes (Signed)
[facility] c/o lower back and lower abd pain.  Abd pain x2 weeks, back pain chronic.  Pt from [facility] and has been there 2 weeks.  Pt with dialysis access to right upper arm.  Pt has prescribed pain medications and pain patch and states sometimes help but pain comes back.  Presents A&O tox3, chest rise even and unlabored, skin warm and dry.

## 2016-11-27 NOTE — ED Notes (Signed)
RN spoke with pts daughter, POA and updated on situation. Daughter reports no one from [facility] had contacted her. RN apologized for inconvience and updated as best as possible. Daughter reports she is on the way into the hospital. Pt updated in situation and has talked to daughter as well.

## 2016-11-27 NOTE — ED Notes (Signed)
MD at bedside updating pt at this time.

## 2016-11-27 NOTE — ED Notes (Signed)
MD at bedside with this nurse, Crystal EDT and Vernona RiegerLaura EDT.  MD performed disimpaction. Order for soap suds enema placed.

## 2016-11-27 NOTE — Consult Note (Cosign Needed)
        Date: 11/27/2016  Patient name: [name]  Medical record number: [id number]  Date of birth: 1933-09-02   Received call from ED re [name] whose diskitis has progressed despite IV vancomycin with HD.  She had aspirate of her disk space in March (likely ON antibiotics) which showed GP cocci in pairs on GS but cultures without growth  I WOULD RECOMMMEND  STOPPING ALL ANTIBIOTICS  IT IS OK TO GET A SET OF BLOOD CULTURES OF GREATER YIELD IF SHE IS BACTEREMIC  I WOULD KEEP HER OFF ABX AND ASK INTERVENTIONAL RADIOLOGY TO ASPIRATE THE DISK SPACE FOR   BACTERIAL CULTURES  FUNGAL CULTURES  I WOULD NOT BROADEN ANTIIBOTICS UNTIL WE HAVE SUCH A CULTURE   IT IS ALSO CRITICAL TO ELUCIDATE HER DRUG ALERGIES AS DR. FITZGERALD MENTIONED ? SHE MIGHT HAVE HAD MYOCLONUS ON CEFTAZIDIME. NOT SURE IF THIS WAS GENUINE ALLERGY VS SEIZURE IN CONTEXT OF BETA LACTAM OR UNRELATED  IT WOULD BE HELPFUL TO ELUCIDATE THIS SO IF WE NEED TO GIVE A BETA LACTAM WE CAN UNDERSTAND RISK better    It is my understanding that she has NO Neuro deficits that would merit Neurosurgical consultation  Furthermore It is my understanding that she was felt to not be a good surgical canddate  Please page me with questions over this weekend at [phone]. Dr. Sampson GoonFitzgerald should be back in person on Monday.  [name] 11/27/2016, 8:28 PM

## 2016-11-27 NOTE — ED Notes (Signed)
Pt continues to report pain in ab d and describes pain as pressure. Pt reports the pain did decrease after enema but has returned.

## 2016-11-27 NOTE — ED Notes (Signed)
Pts POA has arrived and this RN has updated pt and family as best as possible. Family voiced concern that POA was not contacted sooner. RN apologized.

## 2016-11-28 ENCOUNTER — Inpatient Hospital Stay (HOSPITAL_COMMUNITY)
Admission: AD | Admit: 2016-11-28 | Discharge: 2016-12-10 | DRG: 539 | Disposition: A | Discharge status: Skilled Nursing Facility | Payer: Medicare Other | Source: Other Acute Inpatient Hospital | Attending: Internal Medicine | Admitting: Internal Medicine

## 2016-11-28 ENCOUNTER — Encounter (HOSPITAL_COMMUNITY): Payer: Self-pay | Admitting: *Deleted

## 2016-11-28 DIAGNOSIS — E1169 Type 2 diabetes mellitus with other specified complication: Secondary | ICD-10-CM | POA: Diagnosis not present

## 2016-11-28 DIAGNOSIS — M86671 Other chronic osteomyelitis, right ankle and foot: Secondary | ICD-10-CM | POA: Diagnosis not present

## 2016-11-28 DIAGNOSIS — Z79899 Other long term (current) drug therapy: Secondary | ICD-10-CM | POA: Diagnosis not present

## 2016-11-28 DIAGNOSIS — M86171 Other acute osteomyelitis, right ankle and foot: Secondary | ICD-10-CM | POA: Diagnosis present

## 2016-11-28 DIAGNOSIS — E08621 Diabetes mellitus due to underlying condition with foot ulcer: Secondary | ICD-10-CM | POA: Diagnosis not present

## 2016-11-28 DIAGNOSIS — E1122 Type 2 diabetes mellitus with diabetic chronic kidney disease: Secondary | ICD-10-CM | POA: Diagnosis present

## 2016-11-28 DIAGNOSIS — Z9181 History of falling: Secondary | ICD-10-CM | POA: Diagnosis not present

## 2016-11-28 DIAGNOSIS — R911 Solitary pulmonary nodule: Secondary | ICD-10-CM | POA: Diagnosis present

## 2016-11-28 DIAGNOSIS — E871 Hypo-osmolality and hyponatremia: Secondary | ICD-10-CM | POA: Diagnosis not present

## 2016-11-28 DIAGNOSIS — Z91041 Radiographic dye allergy status: Secondary | ICD-10-CM

## 2016-11-28 DIAGNOSIS — Z96643 Presence of artificial hip joint, bilateral: Secondary | ICD-10-CM | POA: Diagnosis not present

## 2016-11-28 DIAGNOSIS — Z794 Long term (current) use of insulin: Secondary | ICD-10-CM | POA: Diagnosis not present

## 2016-11-28 DIAGNOSIS — E876 Hypokalemia: Secondary | ICD-10-CM | POA: Diagnosis present

## 2016-11-28 DIAGNOSIS — Z91048 Other nonmedicinal substance allergy status: Secondary | ICD-10-CM | POA: Diagnosis not present

## 2016-11-28 DIAGNOSIS — Z888 Allergy status to other drugs, medicaments and biological substances status: Secondary | ICD-10-CM | POA: Diagnosis not present

## 2016-11-28 DIAGNOSIS — Z89422 Acquired absence of other left toe(s): Secondary | ICD-10-CM

## 2016-11-28 DIAGNOSIS — M4626 Osteomyelitis of vertebra, lumbar region: Secondary | ICD-10-CM | POA: Diagnosis present

## 2016-11-28 DIAGNOSIS — G253 Myoclonus: Secondary | ICD-10-CM | POA: Diagnosis present

## 2016-11-28 DIAGNOSIS — I482 Chronic atrial fibrillation, unspecified: Secondary | ICD-10-CM | POA: Diagnosis present

## 2016-11-28 DIAGNOSIS — M549 Dorsalgia, unspecified: Secondary | ICD-10-CM | POA: Diagnosis present

## 2016-11-28 DIAGNOSIS — N2581 Secondary hyperparathyroidism of renal origin: Secondary | ICD-10-CM | POA: Diagnosis not present

## 2016-11-28 DIAGNOSIS — L97419 Non-pressure chronic ulcer of right heel and midfoot with unspecified severity: Secondary | ICD-10-CM | POA: Diagnosis present

## 2016-11-28 DIAGNOSIS — L97414 Non-pressure chronic ulcer of right heel and midfoot with necrosis of bone: Secondary | ICD-10-CM

## 2016-11-28 DIAGNOSIS — E8889 Other specified metabolic disorders: Secondary | ICD-10-CM | POA: Diagnosis present

## 2016-11-28 DIAGNOSIS — I5032 Chronic diastolic (congestive) heart failure: Secondary | ICD-10-CM | POA: Diagnosis not present

## 2016-11-28 DIAGNOSIS — R296 Repeated falls: Secondary | ICD-10-CM | POA: Diagnosis not present

## 2016-11-28 DIAGNOSIS — Z885 Allergy status to narcotic agent status: Secondary | ICD-10-CM

## 2016-11-28 DIAGNOSIS — Z992 Dependence on renal dialysis: Secondary | ICD-10-CM

## 2016-11-28 DIAGNOSIS — K7460 Unspecified cirrhosis of liver: Secondary | ICD-10-CM | POA: Diagnosis present

## 2016-11-28 DIAGNOSIS — M4646 Discitis, unspecified, lumbar region: Secondary | ICD-10-CM | POA: Diagnosis present

## 2016-11-28 DIAGNOSIS — E119 Type 2 diabetes mellitus without complications: Secondary | ICD-10-CM | POA: Diagnosis not present

## 2016-11-28 DIAGNOSIS — I132 Hypertensive heart and chronic kidney disease with heart failure and with stage 5 chronic kidney disease, or end stage renal disease: Secondary | ICD-10-CM | POA: Diagnosis present

## 2016-11-28 DIAGNOSIS — E1151 Type 2 diabetes mellitus with diabetic peripheral angiopathy without gangrene: Secondary | ICD-10-CM | POA: Diagnosis present

## 2016-11-28 DIAGNOSIS — E11649 Type 2 diabetes mellitus with hypoglycemia without coma: Secondary | ICD-10-CM | POA: Diagnosis not present

## 2016-11-28 DIAGNOSIS — Z66 Do not resuscitate: Secondary | ICD-10-CM | POA: Diagnosis present

## 2016-11-28 DIAGNOSIS — R791 Abnormal coagulation profile: Secondary | ICD-10-CM | POA: Diagnosis not present

## 2016-11-28 DIAGNOSIS — Z7901 Long term (current) use of anticoagulants: Secondary | ICD-10-CM

## 2016-11-28 DIAGNOSIS — Z881 Allergy status to other antibiotic agents status: Secondary | ICD-10-CM | POA: Diagnosis not present

## 2016-11-28 DIAGNOSIS — M48061 Spinal stenosis, lumbar region without neurogenic claudication: Secondary | ICD-10-CM | POA: Diagnosis present

## 2016-11-28 DIAGNOSIS — D631 Anemia in chronic kidney disease: Secondary | ICD-10-CM | POA: Diagnosis not present

## 2016-11-28 DIAGNOSIS — N186 End stage renal disease: Secondary | ICD-10-CM | POA: Diagnosis present

## 2016-11-28 DIAGNOSIS — E0800 Diabetes mellitus due to underlying condition with hyperosmolarity without nonketotic hyperglycemic-hyperosmolar coma (NKHHC): Secondary | ICD-10-CM | POA: Diagnosis not present

## 2016-11-28 DIAGNOSIS — Z89421 Acquired absence of other right toe(s): Secondary | ICD-10-CM

## 2016-11-28 DIAGNOSIS — M869 Osteomyelitis, unspecified: Secondary | ICD-10-CM

## 2016-11-28 DIAGNOSIS — E11621 Type 2 diabetes mellitus with foot ulcer: Secondary | ICD-10-CM | POA: Diagnosis present

## 2016-11-28 DIAGNOSIS — M86019 Acute hematogenous osteomyelitis, unspecified shoulder: Secondary | ICD-10-CM | POA: Diagnosis not present

## 2016-11-28 DIAGNOSIS — L97519 Non-pressure chronic ulcer of other part of right foot with unspecified severity: Secondary | ICD-10-CM | POA: Diagnosis not present

## 2016-11-28 DIAGNOSIS — Z9889 Other specified postprocedural states: Secondary | ICD-10-CM | POA: Diagnosis not present

## 2016-11-28 LAB — BASIC METABOLIC PANEL
Anion gap: 10 (ref 5–15)
BUN: 16 mg/dL (ref 6–20)
CHLORIDE: 97 mmol/L — AB (ref 101–111)
CO2: 23 mmol/L (ref 22–32)
Calcium: 8.4 mg/dL — ABNORMAL LOW (ref 8.9–10.3)
Creatinine, Ser: 3.16 mg/dL — ABNORMAL HIGH (ref 0.44–1.00)
GFR calc Af Amer: 15 mL/min — ABNORMAL LOW (ref >60)
GFR calc non Af Amer: 13 mL/min — ABNORMAL LOW (ref >60)
Glucose, Bld: 87 mg/dL (ref 65–99)
Potassium: 3.5 mmol/L (ref 3.5–5.1)
Sodium: 130 mmol/L — ABNORMAL LOW (ref 135–145)

## 2016-11-28 LAB — GLUCOSE, CAPILLARY
Glucose-Capillary: 94 mg/dL (ref 65–99)
Glucose-Capillary: 98 mg/dL (ref 65–99)
Glucose-Capillary: 73 mg/dL (ref 65–99)
Glucose-Capillary: 90 mg/dL (ref 65–99)

## 2016-11-28 LAB — PROTIME-INR
INR: 3.45
PROTHROMBIN TIME: 35.6 s — AB (ref 11.4–15.2)

## 2016-11-28 MED ORDER — BIOTIN 1 MG PO CAPS: 1.0000 mg | ORAL_CAPSULE | Freq: Every day | ORAL | Status: DC

## 2016-11-28 MED ORDER — GABAPENTIN 300 MG PO CAPS
300.0000 mg | ORAL_CAPSULE | ORAL | Status: DC
  Administered 2016-11-30 – 2016-12-09 (×4): 300 mg via ORAL
  Filled 2016-11-28 (×5): qty 1

## 2016-11-28 MED ORDER — OXYCODONE HCL 5 MG PO TABS: 5.0000 mg | ORAL_TABLET | ORAL | Status: DC | PRN

## 2016-11-28 MED ORDER — FAMOTIDINE 20 MG PO TABS
20.0000 mg | ORAL_TABLET | Freq: Every day | ORAL | Status: DC
  Administered 2016-11-28 – 2016-12-09 (×13): 20 mg via ORAL
  Filled 2016-11-28 (×13): qty 1

## 2016-11-28 MED ORDER — TRAMADOL HCL 50 MG PO TABS
50.0000 mg | ORAL_TABLET | Freq: Four times a day (QID) | ORAL | Status: DC | PRN
  Administered 2016-11-30 – 2016-12-08 (×4): 50 mg via ORAL
  Filled 2016-11-28 (×5): qty 1

## 2016-11-28 MED ORDER — INSULIN ASPART 100 UNIT/ML ~~LOC~~ SOLN: 0.0000 [IU] | SUBCUTANEOUS | Status: DC

## 2016-11-28 MED ORDER — ALBUTEROL SULFATE (2.5 MG/3ML) 0.083% IN NEBU: 3.0000 mL | INHALATION_SOLUTION | Freq: Four times a day (QID) | RESPIRATORY_TRACT | Status: DC | PRN

## 2016-11-28 MED ORDER — VITAMIN K1 10 MG/ML IJ SOLN
2.0000 mg | Freq: Once | INTRAMUSCULAR | Status: AC
  Administered 2016-11-28: 2 mg via SUBCUTANEOUS
  Filled 2016-11-28 (×2): qty 0.2

## 2016-11-28 MED ORDER — OXYCODONE HCL 5 MG PO TABS
5.0000 mg | ORAL_TABLET | ORAL | Status: DC | PRN
  Administered 2016-11-28 – 2016-12-04 (×4): 10 mg via ORAL
  Administered 2016-12-07: 5 mg via ORAL
  Filled 2016-11-28 (×2): qty 2
  Filled 2016-11-28: qty 1
  Filled 2016-11-28 (×2): qty 2

## 2016-11-28 MED ORDER — SENNOSIDES-DOCUSATE SODIUM 8.6-50 MG PO TABS
2.0000 | ORAL_TABLET | Freq: Two times a day (BID) | ORAL | Status: DC
  Administered 2016-11-28 – 2016-12-10 (×23): 2 via ORAL
  Filled 2016-11-28 (×24): qty 2

## 2016-11-28 MED ORDER — PRAVASTATIN SODIUM 20 MG PO TABS
20.0000 mg | ORAL_TABLET | Freq: Every evening | ORAL | Status: DC
  Administered 2016-11-28 – 2016-12-09 (×12): 20 mg via ORAL
  Filled 2016-11-28 (×12): qty 1

## 2016-11-28 MED ORDER — POLYETHYLENE GLYCOL 3350 17 G PO PACK: 17.0000 g | PACK | ORAL | Status: DC | PRN

## 2016-11-28 MED ORDER — INSULIN ASPART 100 UNIT/ML ~~LOC~~ SOLN
0.0000 [IU] | Freq: Three times a day (TID) | SUBCUTANEOUS | Status: DC
  Administered 2016-12-01: 3 [IU] via SUBCUTANEOUS
  Administered 2016-12-02: 2 [IU] via SUBCUTANEOUS
  Administered 2016-12-02: 1 [IU] via SUBCUTANEOUS
  Administered 2016-12-03: 2 [IU] via SUBCUTANEOUS
  Administered 2016-12-03: 1 [IU] via SUBCUTANEOUS
  Administered 2016-12-03 – 2016-12-04 (×2): 2 [IU] via SUBCUTANEOUS
  Administered 2016-12-04: 1 [IU] via SUBCUTANEOUS
  Administered 2016-12-05: 2 [IU] via SUBCUTANEOUS
  Administered 2016-12-05: 1 [IU] via SUBCUTANEOUS
  Administered 2016-12-05 – 2016-12-06 (×2): 2 [IU] via SUBCUTANEOUS
  Administered 2016-12-07: 1 [IU] via SUBCUTANEOUS
  Administered 2016-12-08: 3 [IU] via SUBCUTANEOUS
  Administered 2016-12-08 – 2016-12-09 (×3): 1 [IU] via SUBCUTANEOUS
  Administered 2016-12-10: 2 [IU] via SUBCUTANEOUS

## 2016-11-28 MED ORDER — PAROXETINE HCL 20 MG PO TABS
40.0000 mg | ORAL_TABLET | Freq: Every day | ORAL | Status: DC
  Administered 2016-11-28 – 2016-12-09 (×12): 40 mg via ORAL
  Filled 2016-11-28 (×12): qty 2

## 2016-11-28 MED ORDER — GABAPENTIN 100 MG PO CAPS
100.0000 mg | ORAL_CAPSULE | ORAL | Status: DC
  Administered 2016-11-28 – 2016-12-01 (×3): 100 mg via ORAL
  Filled 2016-11-28 (×4): qty 1

## 2016-11-28 MED ORDER — FUROSEMIDE 80 MG PO TABS
80.0000 mg | ORAL_TABLET | Freq: Two times a day (BID) | ORAL | Status: DC
  Administered 2016-11-28: 80 mg via ORAL
  Filled 2016-11-28: qty 1

## 2016-11-28 MED ORDER — OMEGA-3-ACID ETHYL ESTERS 1 G PO CAPS
1000.0000 mg | ORAL_CAPSULE | Freq: Every day | ORAL | Status: DC
  Administered 2016-11-28 – 2016-12-10 (×13): 1000 mg via ORAL
  Filled 2016-11-28 (×13): qty 1

## 2016-11-28 MED ORDER — LIDOCAINE-PRILOCAINE 2.5-2.5 % EX CREA: 1.0000 "application " | TOPICAL_CREAM | CUTANEOUS | Status: DC | PRN

## 2016-11-28 MED ORDER — CYCLOBENZAPRINE HCL 10 MG PO TABS
5.0000 mg | ORAL_TABLET | Freq: Three times a day (TID) | ORAL | Status: DC | PRN
  Administered 2016-11-30 – 2016-12-08 (×5): 5 mg via ORAL
  Filled 2016-11-28 (×5): qty 1

## 2016-11-28 MED ORDER — CALCIUM ACETATE (PHOS BINDER) 667 MG PO CAPS
1334.0000 mg | ORAL_CAPSULE | Freq: Three times a day (TID) | ORAL | Status: DC
  Administered 2016-11-28 – 2016-12-10 (×33): 1334 mg via ORAL
  Filled 2016-11-28 (×32): qty 2

## 2016-11-28 MED ORDER — ACETAMINOPHEN 325 MG PO TABS
650.0000 mg | ORAL_TABLET | Freq: Four times a day (QID) | ORAL | Status: DC
  Administered 2016-11-28 – 2016-12-10 (×43): 650 mg via ORAL
  Filled 2016-11-28 (×44): qty 2

## 2016-11-28 MED ORDER — LIDOCAINE 5 % EX PTCH
1.0000 | MEDICATED_PATCH | CUTANEOUS | Status: DC
  Administered 2016-11-28 – 2016-12-10 (×13): 1 via TRANSDERMAL
  Filled 2016-11-28 (×13): qty 1

## 2016-11-28 MED ORDER — RENA-VITE PO TABS
1.0000 | ORAL_TABLET | Freq: Every day | ORAL | Status: DC
  Administered 2016-11-28 – 2016-12-10 (×13): 1 via ORAL
  Filled 2016-11-28 (×13): qty 1

## 2016-11-28 MED ORDER — DILTIAZEM HCL ER COATED BEADS 120 MG PO CP24
120.0000 mg | ORAL_CAPSULE | Freq: Every day | ORAL | Status: DC
  Administered 2016-11-28 – 2016-12-09 (×12): 120 mg via ORAL
  Filled 2016-11-28 (×12): qty 1

## 2016-11-28 NOTE — Progress Notes (Signed)
PROGRESS NOTE    [name]   MWU:[id number]  DOB: 02-29-1934  DOA: 11/28/2016 PCP: Rolm GalaGrandis, Heidi, MD   Brief Narrative:  [name] is a 83 y.o. female with medical history significant of ESRD dialysis on MWF, A.Fib on coumadin, HTN.  Patient has recently been battling diskitis / osteomyelitis of L2-L3. Aspirate in March demonstrated GPC in pairs on GS but cultures without growth.  She was put on vancomycin with dialysis.  Despite this her back pain has worsened and she presented to the ED at [facility].   CT reveals worsening L2-L3 discitis/ osteomyelitis with severe canal stenosis.  She was seen by ID on 6/29. They recommended to hold all antibiotics for a repeat aspiration of disc space for culture as soon as possible.   Subjective: Mild back pain. No nausea, vomiting, chest pain, dyspnea, cough, constipation or diarrhea. No other complaints.   Assessment & Plan:   Principal Problem:   Discitis and Osteomyelitis of lumbar spine  - worsening L2-L3 discitis/ osteomyelitis with severe canal stenosis on CT on 6/29 - appreciate ID eval- holding antibiotics for now- previously on Vanc - IR will aspirate when INR has comes down - needs to wear back brace  Active Problems:   ESRD on dialysis   - MWF dialysis - nephrology has been notified  Early cirrhosis - noted on CT 6/29  Right middle lobe pulm nodules - stable from last imaging 13 mo ago- f/u Ct in 1 yr  dCHF- chronic - on lasix 80 BID as outpt - mod conc hypertrophy on last ECHO 1/17    Diabetes mellitus 2 - low dose SSI- Levemir on hold    Chronic atrial fibrillation  - CHA2DS2-VASc Score at least 6 - Coumadin on hold now - cont Cardizem  Supratherapeutic INR - INR 3.45 today- will give Vit K  Hypokalemia - 2.9 improved to 3.5 today  Right sciatic nerve stimulator   DVT prophylaxis: INR supratherapeutic  Code Status: DNR Family Communication:  Disposition Plan:  Consultants:    ID  IR Procedures:     Antimicrobials:  Anti-infectives    None       Objective: Vitals:   11/28/16 0100 11/28/16 0700 11/28/16 1021 11/28/16 1347  BP: (!) 156/68 (!) 143/84 (!) 146/56 (!) 123/99  Pulse: (!) 105 98 92 91  Resp: (!) 22 20 20 20   Temp: 98.4 F (36.9 C) 99.1 F (37.3 C) 98 F (36.7 C) 97.7 F (36.5 C)  TempSrc: Oral Oral Oral Oral  SpO2: 100% 99% 100% 100%  Weight: 66.7 kg (147 lb)     Height: 5\' 5"  (1.651 m)      No intake or output data in the 24 hours ending 11/28/16 1421 Filed Weights   11/28/16 0100  Weight: 66.7 kg (147 lb)    Examination: General exam: Appears comfortable  HEENT: PERRLA, oral mucosa moist, no sclera icterus or thrush Respiratory system: Clear to auscultation. Respiratory effort normal. Cardiovascular system: S1 & S2 heard, RRR.  No murmurs  Gastrointestinal system: Abdomen soft, non-tender, nondistended. Normal bowel sound. No organomegaly Central nervous system: Alert and oriented. No focal neurological deficits. Extremities: No cyanosis, clubbing or edema Skin: No rashes or ulcers Psychiatry:  Mood & affect appropriate.     Data Reviewed: I have personally reviewed following labs and imaging studies  CBC:  Recent Labs Lab 11/27/16 1617  WBC 7.5  HGB 12.3  HCT 36.8  MCV 92.2  PLT 282   Basic Metabolic  Panel:  Recent Labs Lab 11/27/16 1617 11/28/16 0814  NA 131* 130*  K 2.9* 3.5  CL 94* 97*  CO2 27 23  GLUCOSE 91 87  BUN 13 16  CREATININE 2.26* 3.16*  CALCIUM 9.0 8.4*   GFR: Estimated Creatinine Clearance: 12.6 mL/min (A) (by C-G formula based on SCr of 3.16 mg/dL (H)). Liver Function Tests:  Recent Labs Lab 11/27/16 1617  AST 27  ALT 16  ALKPHOS 93  BILITOT 0.9  PROT 7.3  ALBUMIN 3.2*    Recent Labs Lab 11/27/16 1617  LIPASE 18   No results for input(s): AMMONIA in the last 168 hours. Coagulation Profile:  Recent Labs Lab 11/28/16 0300  INR 3.45   Cardiac Enzymes: No  results for input(s): CKTOTAL, CKMB, CKMBINDEX, TROPONINI in the last 168 hours. BNP (last 3 results) No results for input(s): PROBNP in the last 8760 hours. HbA1C: No results for input(s): HGBA1C in the last 72 hours. CBG:  Recent Labs Lab 11/28/16 0410 11/28/16 0645 11/28/16 1114  GLUCAP 94 98 73   Lipid Profile: No results for input(s): CHOL, HDL, LDLCALC, TRIG, CHOLHDL, LDLDIRECT in the last 72 hours. Thyroid Function Tests: No results for input(s): TSH, T4TOTAL, FREET4, T3FREE, THYROIDAB in the last 72 hours. Anemia Panel: No results for input(s): VITAMINB12, FOLATE, FERRITIN, TIBC, IRON, RETICCTPCT in the last 72 hours. Urine analysis:    Component Value Date/Time   COLORURINE AMBER (A) 08/09/2016 2204   APPEARANCEUR TURBID (A) 08/09/2016 2204   LABSPEC 1.014 08/09/2016 2204   PHURINE 6.0 08/09/2016 2204   GLUCOSEU NEGATIVE 08/09/2016 2204   HGBUR MODERATE (A) 08/09/2016 2204   BILIRUBINUR NEGATIVE 08/09/2016 2204   KETONESUR NEGATIVE 08/09/2016 2204   PROTEINUR 100 (A) 08/09/2016 2204   NITRITE NEGATIVE 08/09/2016 2204   LEUKOCYTESUR LARGE (A) 08/09/2016 2204   Sepsis Labs: @LABRCNTIP (procalcitonin:4,lacticidven:4) )No results found for this or any previous visit (from the past 240 hour(s)).       Radiology Studies: Ct Abdomen Pelvis Wo Contrast  Result Date: 11/27/2016 CLINICAL DATA:  Acute on chronic low back pain, abdominal for 2 weeks after dialysis. History of hypertension, diabetes, cholecystectomy, and lumbar discitis osteomyelitis. EXAM: CT ABDOMEN AND PELVIS WITHOUT CONTRAST TECHNIQUE: Multidetector CT imaging of the abdomen and pelvis was performed following the standard protocol without IV contrast. Oral contrast administered. COMPARISON:  CT abdomen and pelvis August 09, 2016 and Oct 12, 2015 FINDINGS: LOWER CHEST: Small pleural effusions. Stable 8 mm and 7 mm RIGHT middle lobe pulmonary nodules stable from Oct 12, 2015. Bibasilar atelectasis. The heart  is mildly enlarged. Severe mitral annular calcifications. No pericardial effusion. HEPATOBILIARY: Mildly dense and slightly nodular liver. Status post cholecystectomy. PANCREAS: Normal. SPLEEN: Normal. ADRENALS/URINARY TRACT: Kidneys are orthotopic, Atrophic kidneys bilaterally. Stable 13 mm RIGHT upper pole calcifications without obstructive uropathy. Limited assessment for mass on noncontrast CT. Thickened adrenal gland compatible with hyperplasia decompressed urinary bladder predominately obscured. STOMACH/BOWEL: Small hiatal hernia. The stomach, small and large bowel are normal in course and caliber without inflammatory changes. Mild colonic diverticulosis. Normal appendix. VASCULAR/LYMPHATIC: Aortoiliac vessels are normal in course and caliber, severe. No lymphadenopathy by CT size criteria. REPRODUCTIVE: Normal. OTHER: No intraperitoneal free fluid or free air. MUSCULOSKELETAL: Increasing disc widening L2-3 associated with endplate erosion and sclerosis. Severe canal stenosis at L2-3. Bilateral hip total arthroplasties resultant streak artifact limiting assessment of the pelvis. RIGHT sciatic nerve stimulator. Trophic pelvic musculature. Small fat containing umbilical hernia. IMPRESSION: 1. No acute intra-abdominopelvic process by noncontrast CT. 2.  Worsening L2-3 discitis osteomyelitis. 3. 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 4. Thirteen month stability RIGHT middle lobe pulmonary nodules. Recommend follow-up CT chest in 1 year to complete standard recommendation for 2 year stability. Aortic Atherosclerosis (ICD10-I70.0). Electronically Signed   By: Awilda Metro M.D.   On: 11/27/2016 19:37      Scheduled Meds: . acetaminophen  650 mg Oral QID  . calcium acetate  1,334 mg Oral TID WC  . diltiazem  120 mg Oral QHS  . famotidine  20 mg Oral QHS  . furosemide  80 mg Oral BID  . gabapentin  100 mg Oral Once per day on Sun Tue Thu Sat  . [START ON 11/30/2016] gabapentin  300 mg Oral Once per day on Mon  Wed Fri  . insulin aspart  0-9 Units Subcutaneous Q4H  . lidocaine  1 patch Transdermal Q24H  . multivitamin  1 tablet Oral Daily  . omega-3 acid ethyl esters  1,000 mg Oral Daily  . PARoxetine  40 mg Oral QHS  . pravastatin  20 mg Oral QPM  . senna-docusate  2 tablet Oral BID   Continuous Infusions:   LOS: 0 days    Time spent in minutes: 35    Calvert Cantor, MD [facility] Pager: [email or web address] Password [email or web address] 11/28/2016, 2:21 PM

## 2016-11-28 NOTE — H&P (Signed)
History and Physical    [name] HYQ:[id number] DOB: [birth year] DOA: 11/28/2016  PCP: [name], MD  Patient coming from: Home  I have personally briefly reviewed patient's old medical records in [facility]  Chief Complaint: Back pain  HPI: [name] is a 83 y.o. female with medical history significant of ESRD dialysis on MWF, A.Fib on coumadin, HTN.  Patient has recently been battling diskitis / osteomyelitis of L2-L3.  Aspirate in March demonstrated GPC in pairs on GS but cultures without growth.  She was put on vancomycin with dialysis.  Despite this her back pain has worsened and she presented to the ED at [facility] once again today.  Work up there includes CT abd/pelvis which demonstrates worsening of her osteomyelitis.   ED Course: Dr. Daiva Eves was consulted (his note is available in chart), he recommended no ABx at this time and IR aspirate for repeat culture.  Patient transferred to [facility].   Review of Systems: As per HPI otherwise 10 point review of systems negative.   Past Medical History:  Diagnosis Date  . Anemia   . Anxiety   . Arthritis    gout  . Atrial fibrillation (HCC)   . CHF (congestive heart failure) (HCC)    has had this off and on prior to dialysis but has had several times since  . Chronic kidney disease    end stage renal insufficiency.  on dialysis M,W,F  . Diabetes mellitus without complication (HCC)   . Dialysis patient [facility])    Mon.- Wed- Fri.  . Dysrhythmia    afib/flutter  . GERD (gastroesophageal reflux disease)   . Hypertension   . 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 Peripheral vascular disease (HCC)   . Pleural effusion   . Pulmonary hypertension (HCC)   . Renal insufficiency   . Restless leg syndrome   . Shortness of breath dyspnea    with exertion  . Stroke (HCC) 06/2015   has had 1 other stroke. first one affected vision. the most recent one affected right arm    Past Surgical History:  Procedure  Laterality Date  . AMPUTATION Right 11/07/2015   Procedure: AMPUTATION DIGIT ( 4TH TOE, RIGHT FOOT );  Surgeon: Annice Needy, MD;  Location: [facility] ORS;  Service: Vascular;  Laterality: Right;  . AMPUTATION TOE Left 06/07/2015   Procedure: AMPUTATION TOE;  Surgeon: Gwyneth Revels, DPM;  Location: [facility] ORS;  Service: Podiatry;  Laterality: Left;  . CARDIAC CATHETERIZATION    . CATARACT EXTRACTION, BILATERAL Bilateral   . CHOLECYSTECTOMY    . DILATION AND CURETTAGE OF UTERUS    . ENDARTERECTOMY Left 07/26/2015   Procedure: ENDARTERECTOMY CAROTID;  Surgeon: Renford Dills, MD;  Location: [facility] ORS;  Service: Vascular;  Laterality: Left;  . EYE SURGERY    . FISTULAGRAM ([facility] HX)    . HEEL DECUBITUS ULCER EXCISION Right 05/13/2016  . INCISION AND DRAINAGE ABSCESS Right 02/16/2016   Procedure: INCISION AND DRAINAGE ABSCESS and application wound vac;  Surgeon: Bertram Denver, MD;  Location: [facility] ORS;  Service: Urology;  Laterality: Right;  . IR GENERIC HISTORICAL  08/14/2016   IR FLUORO GUIDED NEEDLE PLC ASPIRATION/INJECTION LOC 08/14/2016 [facility]-INTERV RAD  . JOINT REPLACEMENT     bilateral hip replacement  . LOWER EXTREMITY ANGIOGRAPHY Right 10/29/2016   Procedure: Lower Extremity Angiography;  Surgeon: Annice Needy, MD;  Location: [facility] INVASIVE CV LAB;  Service: Cardiovascular;  Laterality: Right;  .  LOWER EXTREMITY INTERVENTION  10/29/2016   Procedure: Lower Extremity Intervention;  Surgeon: Annice Needy, MD;  Location: [facility] INVASIVE CV LAB;  Service: Cardiovascular;;  . MEDTRONIC BLADDER INTERSTEM     currently turned off for MRI in January  . PARATHYROIDECTOMY    . PERIPHERAL VASCULAR CATHETERIZATION N/A 10/08/2014   Procedure: A/V Shuntogram/Fistulagram;  Surgeon: Annice Needy, MD;  Location: [facility] INVASIVE CV LAB;  Service: Cardiovascular;  Laterality: N/A;  . PERIPHERAL VASCULAR CATHETERIZATION N/A 03/04/2015   Procedure: A/V Shuntogram/Fistulagram;  Surgeon: Annice Needy, MD;  Location: [facility] INVASIVE CV  LAB;  Service: Cardiovascular;  Laterality: N/A;  . PERIPHERAL VASCULAR CATHETERIZATION N/A 03/04/2015   Procedure: A/V Shunt Intervention;  Surgeon: Annice Needy, MD;  Location: [facility] INVASIVE CV LAB;  Service: Cardiovascular;  Laterality: N/A;  . PERIPHERAL VASCULAR CATHETERIZATION N/A 09/16/2015   Procedure: Lower Extremity Angiography;  Surgeon: Annice Needy, MD;  Location: [facility] INVASIVE CV LAB;  Service: Cardiovascular;  Laterality: N/A;  . PERIPHERAL VASCULAR CATHETERIZATION  09/16/2015   Procedure: Lower Extremity Intervention;  Surgeon: Annice Needy, MD;  Location: [facility] INVASIVE CV LAB;  Service: Cardiovascular;;  . PERIPHERAL VASCULAR CATHETERIZATION Right 04/27/2016   Procedure: A/V Shuntogram/Fistulagram;  Surgeon: Annice Needy, MD;  Location: [facility] INVASIVE CV LAB;  Service: Cardiovascular;  Laterality: Right;  . PERIPHERAL VASCULAR CATHETERIZATION Left 05/14/2016   Procedure: DIALYSIS/PERMA CATHETER INSERTION;  Surgeon: Annice Needy, MD;  Location: [facility] ORS;  Service: Vascular;  Laterality: Left;  . PERIPHERAL VASCULAR CATHETERIZATION N/A 06/08/2016   Procedure: Dialysis/Perma Catheter Removal;  Surgeon: Annice Needy, MD;  Location: [facility] INVASIVE CV LAB;  Service: Cardiovascular;  Laterality: N/A;  . REVISON OF ARTERIOVENOUS FISTULA Right 05/14/2016   Procedure: REVISON OF ARTERIOVENOUS FISTULA ( WITH ARTEGRAFT );  Surgeon: Annice Needy, MD;  Location: [facility] ORS;  Service: Vascular;  Laterality: Right;  . THYROIDECTOMY, PARTIAL    . TONSILLECTOMY    . WOUND DEBRIDEMENT Right 11/07/2015   Procedure: DEBRIDEMENT WOUND ( RIGHT HEEL AND ABDOMINAL WOUND (2) );  Surgeon: Annice Needy, MD;  Location: [facility] ORS;  Service: Vascular;  Laterality: Right;  . WOUND DEBRIDEMENT N/A 11/28/2015   Procedure: DEBRIDEMENT WOUND ( ABDOMINAL );  Surgeon: Annice Needy, MD;  Location: [facility] ORS;  Service: Vascular;  Laterality: N/A;     reports that she has never smoked. She has never used smokeless tobacco. She reports that  she does not drink alcohol or use drugs.  Allergies  Allergen Reactions  . Codeine Itching and Other (See Comments)    Reaction:  Confusion/Hallucinations   . Contrast Media [Iodinated Diagnostic Agents] Other (See Comments)    "skin peel" breaks into a blistered rash, then peel. If given Benadryl and solumedrol she will be okay  . Oxycodone Other (See Comments)    Reaction:  Confusion/Hallucinations   . Quinine Derivatives Rash  . Tape Other (See Comments)    Skin is very thin and will blister and pull off skin with any tape except paper tape.  PLEASE USE PAPER TAPE.    Family History  Problem Relation Age of Onset  . Colon cancer Mother   . Stroke Father   . Diabetes Brother      Prior to Admission medications   Medication Sig Start Date End Date Taking? Authorizing Provider  acetaminophen (TYLENOL) 650 MG CR tablet Take 1,300 mg by mouth 2 (two) times daily.    [provider]  albuterol (PROVENTIL HFA;VENTOLIN HFA)  108 (90 Base) MCG/ACT inhaler Inhale 1 puff into the lungs every 6 (six) hours as needed for wheezing or shortness of breath.     [provider]  Biotin 1 MG CAPS Take 1 mg by mouth daily.     [provider]  calcium acetate (PHOSLO) 667 MG tablet Take 1,334 mg by mouth 3 (three) times daily with meals. With any food    [provider]  cyclobenzaprine (FLEXERIL) 5 MG tablet Take 1 tablet (5 mg total) by mouth 3 (three) times daily as needed for muscle spasms. 11/19/16   Delfino LovettShah, Vipul, MD  diltiazem (CARDIZEM CD) 120 MG 24 hr capsule Take 120 mg by mouth at bedtime.  10/15/16   [provider]  folic acid-vitamin b complex-vitamin c-selenium-zinc (DIALYVITE) 3 MG TABS tablet Take 1 tablet by mouth daily.    [provider]  furosemide (LASIX) 80 MG tablet Take 80 mg by mouth 2 (two) times daily.    [provider]  gabapentin (NEURONTIN) 100 MG capsule Take 100 mg by mouth every Tuesday, Thursday, Saturday,  and Sunday. At bedtime    [provider]  gabapentin (NEURONTIN) 300 MG capsule Take 300 mg by mouth every Monday, Wednesday, and Friday at 8 PM. At bedtime    [provider]  insulin detemir (LEVEMIR) 100 UNIT/ML injection Inject 0.06 mLs (6 Units total) into the skin 2 (two) times daily. Patient taking differently: Inject 10 Units into the skin at bedtime.  02/18/16   Adrian SaranMody, Sital, MD  lidocaine (LIDODERM) 5 % Place 1 patch onto the skin daily. Remove & Discard patch within 12 hours or as directed by MD 11/19/16   Delfino LovettShah, Vipul, MD  lidocaine-prilocaine (EMLA) cream Apply 1 application topically as needed (port access). Mon, Wed, Fri before dialysis    [provider]  Omega-3 Fatty Acids (FISH OIL) 1000 MG CAPS Take 1,000 mg by mouth 2 (two) times daily.    [provider]  oxyCODONE (OXY IR/ROXICODONE) 5 MG immediate release tablet Take 1 tablet (5 mg total) by mouth every 4 (four) hours as needed for severe pain. 11/19/16   Delfino LovettShah, Vipul, MD  PARoxetine (PAXIL) 40 MG tablet Take 40 mg by mouth at bedtime.     [provider]  polyethylene glycol (MIRALAX / GLYCOLAX) packet Take 17 g by mouth as needed.     [provider]  pravastatin (PRAVACHOL) 20 MG tablet Take 20 mg by mouth every evening.     [provider]  ranitidine (ZANTAC) 150 MG tablet Take 150 mg by mouth at bedtime.     [provider]  senna-docusate (SENOKOT-S) 8.6-50 MG tablet Take 2 tablets by mouth 2 (two) times daily. 11/19/16   Delfino LovettShah, Vipul, MD  traMADol (ULTRAM) 50 MG tablet Take 1 tablet (50 mg total) by mouth every 6 (six) hours as needed for moderate pain. 11/19/16   Delfino LovettShah, Vipul, MD  vancomycin (VANCOCIN) 1-5 GM/200ML-% SOLN Inject 200 mLs (1,000 mg total) into the vein every Monday, Wednesday, and Friday with hemodialysis. She will need this for at least 12 weeks, further course to be determined by infectious disease Dr. Sampson GoonFitzgerald 11/20/16   Delfino LovettShah, Vipul, MD    warfarin (COUMADIN) 2.5 MG tablet Take 2.5 mg by mouth daily at 6 PM.     [provider]    Physical Exam: Vitals:   11/28/16 0100  BP: (!) 156/68  Pulse: (!) 105  Resp: (!) 22  Temp: 98.4 F (36.9  C)  TempSrc: Oral  SpO2: 100%  Weight: 66.7 kg (147 lb)  Height: 5\' 5"  (1.651 m)    Constitutional: NAD, calm, comfortable Eyes: PERRL, lids and conjunctivae normal ENMT: Mucous membranes are moist. Posterior pharynx clear of any exudate or lesions.Normal dentition.  Neck: normal, supple, no masses, no thyromegaly Respiratory: clear to auscultation bilaterally, no wheezing, no crackles. Normal respiratory effort. No accessory muscle use.  Cardiovascular: Regular rate and rhythm, no murmurs / rubs / gallops. No extremity edema. 2+ pedal pulses. No carotid bruits.  Abdomen: no tenderness, no masses palpated. No hepatosplenomegaly. Bowel sounds positive.  Musculoskeletal: no clubbing / cyanosis. No joint deformity upper and lower extremities. Good ROM, no contractures. Normal muscle tone.  Skin: no rashes, lesions, ulcers. No induration Neurologic: Strength 5/5 in all 4.  Psychiatric: Normal judgment and insight. Alert and oriented x 3. Normal mood.    Labs on Admission: I have personally reviewed following labs and imaging studies  CBC:  Recent Labs Lab 11/27/16 1617  WBC 7.5  HGB 12.3  HCT 36.8  MCV 92.2  PLT 282   Basic Metabolic Panel:  Recent Labs Lab 11/27/16 1617  NA 131*  K 2.9*  CL 94*  CO2 27  GLUCOSE 91  BUN 13  CREATININE 2.26*  CALCIUM 9.0   GFR: Estimated Creatinine Clearance: 17.6 mL/min (A) (by C-G formula based on SCr of 2.26 mg/dL (H)). Liver Function Tests:  Recent Labs Lab 11/27/16 1617  AST 27  ALT 16  ALKPHOS 93  BILITOT 0.9  PROT 7.3  ALBUMIN 3.2*    Recent Labs Lab 11/27/16 1617  LIPASE 18   No results for input(s): AMMONIA in the last 168 hours. Coagulation Profile: No results for input(s): INR, PROTIME in the  last 168 hours. Cardiac Enzymes: No results for input(s): CKTOTAL, CKMB, CKMBINDEX, TROPONINI in the last 168 hours. BNP (last 3 results) No results for input(s): PROBNP in the last 8760 hours. HbA1C: No results for input(s): HGBA1C in the last 72 hours. CBG: No results for input(s): GLUCAP in the last 168 hours. Lipid Profile: No results for input(s): CHOL, HDL, LDLCALC, TRIG, CHOLHDL, LDLDIRECT in the last 72 hours. Thyroid Function Tests: No results for input(s): TSH, T4TOTAL, FREET4, T3FREE, THYROIDAB in the last 72 hours. Anemia Panel: No results for input(s): VITAMINB12, FOLATE, FERRITIN, TIBC, IRON, RETICCTPCT in the last 72 hours. Urine analysis:    Component Value Date/Time   COLORURINE AMBER (A) 08/09/2016 2204   APPEARANCEUR TURBID (A) 08/09/2016 2204   LABSPEC 1.014 08/09/2016 2204   PHURINE 6.0 08/09/2016 2204   GLUCOSEU NEGATIVE 08/09/2016 2204   HGBUR MODERATE (A) 08/09/2016 2204   BILIRUBINUR NEGATIVE 08/09/2016 2204   KETONESUR NEGATIVE 08/09/2016 2204   PROTEINUR 100 (A) 08/09/2016 2204   NITRITE NEGATIVE 08/09/2016 2204   LEUKOCYTESUR LARGE (A) 08/09/2016 2204    Radiological Exams on Admission: Ct Abdomen Pelvis Wo Contrast  Result Date: 11/27/2016 CLINICAL DATA:  Acute on chronic low back pain, abdominal for 2 weeks after dialysis. History of hypertension, diabetes, cholecystectomy, and lumbar discitis osteomyelitis. EXAM: CT ABDOMEN AND PELVIS WITHOUT CONTRAST TECHNIQUE: Multidetector CT imaging of the abdomen and pelvis was performed following the standard protocol without IV contrast. Oral contrast administered. COMPARISON:  CT abdomen and pelvis August 09, 2016 and Oct 12, 2015 FINDINGS: LOWER CHEST: Small pleural effusions. Stable 8 mm and 7 mm RIGHT middle lobe pulmonary nodules stable from Oct 12, 2015. Bibasilar atelectasis. The heart is mildly enlarged. Severe mitral annular  calcifications. No pericardial effusion. HEPATOBILIARY: Mildly dense and  slightly nodular liver. Status post cholecystectomy. PANCREAS: Normal. SPLEEN: Normal. ADRENALS/URINARY TRACT: Kidneys are orthotopic, Atrophic kidneys bilaterally. Stable 13 mm RIGHT upper pole calcifications without obstructive uropathy. Limited assessment for mass on noncontrast CT. Thickened adrenal gland compatible with hyperplasia decompressed urinary bladder predominately obscured. STOMACH/BOWEL: Small hiatal hernia. The stomach, small and large bowel are normal in course and caliber without inflammatory changes. Mild colonic diverticulosis. Normal appendix. VASCULAR/LYMPHATIC: Aortoiliac vessels are normal in course and caliber, severe. No lymphadenopathy by CT size criteria. REPRODUCTIVE: Normal. OTHER: No intraperitoneal free fluid or free air. MUSCULOSKELETAL: Increasing disc widening L2-3 associated with endplate erosion and sclerosis. Severe canal stenosis at L2-3. Bilateral hip total arthroplasties resultant streak artifact limiting assessment of the pelvis. RIGHT sciatic nerve stimulator. Trophic pelvic musculature. Small fat containing umbilical hernia. IMPRESSION: 1. No acute intra-abdominopelvic process by noncontrast CT. 2. Worsening L2-3 discitis osteomyelitis. 3. 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 4. Thirteen month stability RIGHT middle lobe pulmonary nodules. Recommend follow-up CT chest in 1 year to complete standard recommendation for 2 year stability. Aortic Atherosclerosis (ICD10-I70.0). Electronically Signed   By: Awilda Metro M.D.   On: 11/27/2016 19:37    EKG: Independently reviewed.  Assessment/Plan Principal Problem:   Osteomyelitis of lumbar spine (HCC) Active Problems:   ESRD on dialysis (HCC)   Diabetes mellitus (HCC)   Chronic atrial fibrillation (HCC)    1. Osteomyelitis of L spine - 1. Neurologically intact at this time 2. See Dr. Zenaida Niece Dam's note 1. No ABx 2. IR eval and plan for aspiration with bacterial and fungal cultures 3. NPO after midnight 4. Holding  coumadin 5. INR pending 2. ESRD - left message with nephrology for routine dialysis while inpatient 3. DM2 - 1. Hold home meds while NPO 2. SSI q4h sensitive scale while NPO 4. Chronic A.Fib - 1. Continue rate control with cardizem 2. Holding coumadin 3. INR pending  DVT prophylaxis: SCDs, INR pending and IR procedure planned Code Status: DNR Family Communication: No family in room Disposition Plan: Home after admit Consults called: IR, ID Admission status: Admit to inpatient - needs aspiration, cultures, and likely IV antibiotics.  Suspect multiple days in hospital.   Hillary Bow DO [facility] Pager [phone]  If 7AM-7PM, please contact day team taking care of patient [email or web address] Password [email or web address]  11/28/2016, 2:25 AM

## 2016-11-28 NOTE — Consult Note (Signed)
Chief Complaint: osteomyelitis of L2-3   Referring Physician:Dr. Jennette Kettle  Supervising Physician: Luanne Bras  Patient Status: [facility] - In-pt  HPI: [name] is a 83 y.o. female who has a history of L2-3 osteomyelitis who underwent a disc aspiration in March that revealed [place].  She has been on vancomycin with her HD sessions.  She was recently admitted to [facility] and discharged on 11-19-16 for the same thing.  She is not considered a surgical candidate.  She was offered an LSO brace but refused.  She was seen by palliative care services at that time.  She was ultimately able to tolerate HD in a chair and did not get changed to hospice care at that time.  However, she apparently started having worsening pain and was taken to [facility] ED last night.   She has been transferred here to [facility] for a disc aspirate and for ID to see the patient.  She does take coumadin at home and INR is 3.45 today.  Past Medical History:  Past Medical History:  Diagnosis Date  . Anemia   . Anxiety   . Arthritis    gout  . Atrial fibrillation ([place])   . CHF (congestive heart failure) ([place])    has had this off and on prior to dialysis but has had several times since  . Chronic kidney disease    end stage renal insufficiency.  on dialysis M,W,F  . Diabetes mellitus without complication ([place])   . Dialysis patient [facility])    Mon.- Wed- Fri.  . Dysrhythmia    afib/flutter  . GERD (gastroesophageal reflux disease)   . Hypertension   . Neuropathy due to type 2 diabetes mellitus ([place])   . Peripheral vascular disease ([place])   . Pleural effusion   . Pulmonary hypertension ([place])   . Renal insufficiency   . Restless leg syndrome   . Shortness of breath dyspnea    with exertion  . Stroke ([place]) 06/2015   has had 1 other stroke. first one affected vision. the most recent one affected right arm    Past Surgical History:  Past Surgical History:  Procedure Laterality Date  . AMPUTATION Right 11/07/2015   Procedure: AMPUTATION DIGIT ( 4TH TOE, RIGHT FOOT );  Surgeon: Algernon Huxley, MD;  Location: [facility] ORS;  Service: Vascular;  Laterality: Right;  . AMPUTATION TOE Left 06/07/2015   Procedure: AMPUTATION TOE;  Surgeon: Samara Deist, DPM;  Location: [facility] ORS;  Service: Podiatry;  Laterality: Left;  . CARDIAC CATHETERIZATION    . CATARACT EXTRACTION, BILATERAL Bilateral   . CHOLECYSTECTOMY    . DILATION AND CURETTAGE OF UTERUS    . ENDARTERECTOMY Left 07/26/2015   Procedure: ENDARTERECTOMY CAROTID;  Surgeon: Katha Cabal, MD;  Location: [facility] ORS;  Service: Vascular;  Laterality: Left;  . EYE SURGERY    . FISTULAGRAM ([place] HX)    . HEEL DECUBITUS ULCER EXCISION Right 05/13/2016  . INCISION AND DRAINAGE ABSCESS Right 02/16/2016   Procedure: INCISION AND DRAINAGE ABSCESS and application wound vac;  Surgeon: Evaristo Bury, MD;  Location: [facility] ORS;  Service: Urology;  Laterality: Right;  . IR GENERIC HISTORICAL  08/14/2016   IR FLUORO GUIDED NEEDLE PLC ASPIRATION/INJECTION LOC 08/14/2016 [facility]-INTERV RAD  . JOINT REPLACEMENT     bilateral hip replacement  . LOWER EXTREMITY ANGIOGRAPHY Right 10/29/2016   Procedure: Lower Extremity Angiography;  Surgeon: Algernon Huxley, MD;  Location: [place] CV LAB;  Service: Cardiovascular;  Laterality: Right;  .  LOWER EXTREMITY INTERVENTION  10/29/2016   Procedure: Lower Extremity Intervention;  Surgeon: Algernon Huxley, MD;  Location: [place] CV LAB;  Service: Cardiovascular;;  . MEDTRONIC BLADDER INTERSTEM     currently turned off for MRI in January  . PARATHYROIDECTOMY    . PERIPHERAL VASCULAR CATHETERIZATION N/A 10/08/2014   Procedure: A/V Shuntogram/Fistulagram;  Surgeon: Algernon Huxley, MD;  Location: [place] CV LAB;  Service: Cardiovascular;  Laterality: N/A;  . PERIPHERAL VASCULAR CATHETERIZATION N/A 03/04/2015   Procedure: A/V Shuntogram/Fistulagram;  Surgeon: Algernon Huxley, MD;  Location: [place] CV LAB;  Service: Cardiovascular;  Laterality: N/A;   . PERIPHERAL VASCULAR CATHETERIZATION N/A 03/04/2015   Procedure: A/V Shunt Intervention;  Surgeon: Algernon Huxley, MD;  Location: [place] CV LAB;  Service: Cardiovascular;  Laterality: N/A;  . PERIPHERAL VASCULAR CATHETERIZATION N/A 09/16/2015   Procedure: Lower Extremity Angiography;  Surgeon: Algernon Huxley, MD;  Location: [place] CV LAB;  Service: Cardiovascular;  Laterality: N/A;  . PERIPHERAL VASCULAR CATHETERIZATION  09/16/2015   Procedure: Lower Extremity Intervention;  Surgeon: Algernon Huxley, MD;  Location: [place] CV LAB;  Service: Cardiovascular;;  . PERIPHERAL VASCULAR CATHETERIZATION Right 04/27/2016   Procedure: A/V Shuntogram/Fistulagram;  Surgeon: Algernon Huxley, MD;  Location: [place] CV LAB;  Service: Cardiovascular;  Laterality: Right;  . PERIPHERAL VASCULAR CATHETERIZATION Left 05/14/2016   Procedure: DIALYSIS/PERMA CATHETER INSERTION;  Surgeon: Algernon Huxley, MD;  Location: [facility] ORS;  Service: Vascular;  Laterality: Left;  . PERIPHERAL VASCULAR CATHETERIZATION N/A 06/08/2016   Procedure: Dialysis/Perma Catheter Removal;  Surgeon: Algernon Huxley, MD;  Location: [place] CV LAB;  Service: Cardiovascular;  Laterality: N/A;  . REVISON OF ARTERIOVENOUS FISTULA Right 05/14/2016   Procedure: REVISON OF ARTERIOVENOUS FISTULA ( WITH ARTEGRAFT );  Surgeon: Algernon Huxley, MD;  Location: [facility] ORS;  Service: Vascular;  Laterality: Right;  . THYROIDECTOMY, PARTIAL    . TONSILLECTOMY    . WOUND DEBRIDEMENT Right 11/07/2015   Procedure: DEBRIDEMENT WOUND ( RIGHT HEEL AND ABDOMINAL WOUND (2) );  Surgeon: Algernon Huxley, MD;  Location: [facility] ORS;  Service: Vascular;  Laterality: Right;  . WOUND DEBRIDEMENT N/A 11/28/2015   Procedure: DEBRIDEMENT WOUND ( ABDOMINAL );  Surgeon: Algernon Huxley, MD;  Location: [facility] ORS;  Service: Vascular;  Laterality: N/A;    Family History:  Family History  Problem Relation Age of Onset  . Colon cancer Mother   . Stroke Father   . Diabetes Brother      Social History:  reports that she has never smoked. She has never used smokeless tobacco. She reports that she does not drink alcohol or use drugs.  Allergies:  Allergies  Allergen Reactions  . Codeine Itching and Other (See Comments)    Reaction:  Confusion/Hallucinations   . Contrast Media [Iodinated Diagnostic Agents] Other (See Comments)    "skin peel" breaks into a blistered rash, then peel. If given Benadryl and solumedrol she will be okay  . Oxycodone Other (See Comments)    Reaction:  Confusion/Hallucinations   . Quinine Derivatives Rash  . Tape Other (See Comments)    Skin is very thin and will blister and pull off skin with any tape except paper tape.  PLEASE USE PAPER TAPE.    Medications: Medications reviewed in epic  Please HPI for pertinent positives, otherwise complete 10 system ROS negative.  Mallampati Score: MD Evaluation Airway: WNL Heart: WNL Abdomen: WNL Chest/ Lungs: WNL ASA  Classification: 3 Mallampati/Airway  Score: Two  Physical Exam: BP (!) 143/84 (BP Location: Left Arm)   Pulse 98   Temp 99.1 F (37.3 C) (Oral)   Resp 20   Ht 5' 5"  (1.651 m)   Wt 147 lb (66.7 kg)   SpO2 99%   BMI 24.46 kg/m  Body mass index is 24.46 kg/m. General: pleasant, elderly white female who is laying in bed in NAD HEENT: head is normocephalic, atraumatic.  Sclera are noninjected.  PERRL.  Ears and nose without any masses or lesions.  Mouth is pink and moist Heart: regular, rate, and rhythm.  Normal s1,s2. No obvious murmurs, gallops, or rubs noted.  Palpable radial pulses bilaterally Lungs: CTAB, no wheezes, rhonchi, or rales noted.  Respiratory effort nonlabored Abd: soft, NT, ND, +BS, no masses, hernias, or organomegaly Psych: A&Ox3 with an appropriate affect.   Labs: Results for orders placed or performed during the hospital encounter of 11/28/16 (from the past 48 hour(s))  Protime-INR     Status: Abnormal   Collection Time: 11/28/16  3:00 AM  Result  Value Ref Range   Prothrombin Time 35.6 (H) 11.4 - 15.2 seconds   INR 3.45   Glucose, capillary     Status: None   Collection Time: 11/28/16  4:10 AM  Result Value Ref Range   Glucose-Capillary 94 65 - 99 mg/dL  Glucose, capillary     Status: None   Collection Time: 11/28/16  6:45 AM  Result Value Ref Range   Glucose-Capillary 98 65 - 99 mg/dL  Basic metabolic panel     Status: Abnormal   Collection Time: 11/28/16  8:14 AM  Result Value Ref Range   Sodium 130 (L) 135 - 145 mmol/L   Potassium 3.5 3.5 - 5.1 mmol/L   Chloride 97 (L) 101 - 111 mmol/L   CO2 23 22 - 32 mmol/L   Glucose, Bld 87 65 - 99 mg/dL   BUN 16 6 - 20 mg/dL   Creatinine, Ser 3.16 (H) 0.44 - 1.00 mg/dL   Calcium 8.4 (L) 8.9 - 10.3 mg/dL   GFR calc non Af Amer 13 (L) >60 mL/min   GFR calc Af Amer 15 (L) >60 mL/min    Comment: (NOTE) The eGFR has been calculated using the CKD EPI equation. This calculation has not been validated in all clinical situations. eGFR's persistently <60 mL/min signify possible Chronic Kidney Disease.    Anion gap 10 5 - 15    Imaging: Ct Abdomen Pelvis Wo Contrast  Result Date: 11/27/2016 CLINICAL DATA:  Acute on chronic low back pain, abdominal for 2 weeks after dialysis. History of hypertension, diabetes, cholecystectomy, and lumbar discitis osteomyelitis. EXAM: CT ABDOMEN AND PELVIS WITHOUT CONTRAST TECHNIQUE: Multidetector CT imaging of the abdomen and pelvis was performed following the standard protocol without IV contrast. Oral contrast administered. COMPARISON:  CT abdomen and pelvis August 09, 2016 and Oct 12, 2015 FINDINGS: LOWER CHEST: Small pleural effusions. Stable 8 mm and 7 mm RIGHT middle lobe pulmonary nodules stable from Oct 12, 2015. Bibasilar atelectasis. The heart is mildly enlarged. Severe mitral annular calcifications. No pericardial effusion. HEPATOBILIARY: Mildly dense and slightly nodular liver. Status post cholecystectomy. PANCREAS: Normal. SPLEEN: Normal.  ADRENALS/URINARY TRACT: Kidneys are orthotopic, Atrophic kidneys bilaterally. Stable 13 mm RIGHT upper pole calcifications without obstructive uropathy. Limited assessment for mass on noncontrast CT. Thickened adrenal gland compatible with hyperplasia decompressed urinary bladder predominately obscured. STOMACH/BOWEL: Small hiatal hernia. The stomach, small and large bowel are normal in course and caliber without  inflammatory changes. Mild colonic diverticulosis. Normal appendix. VASCULAR/LYMPHATIC: Aortoiliac vessels are normal in course and caliber, severe. No lymphadenopathy by CT size criteria. REPRODUCTIVE: Normal. OTHER: No intraperitoneal free fluid or free air. MUSCULOSKELETAL: Increasing disc widening L2-3 associated with endplate erosion and sclerosis. Severe canal stenosis at L2-3. Bilateral hip total arthroplasties resultant streak artifact limiting assessment of the pelvis. RIGHT sciatic nerve stimulator. Trophic pelvic musculature. Small fat containing umbilical hernia. IMPRESSION: 1. No acute intra-abdominopelvic process by noncontrast CT. 2. Worsening L2-3 discitis osteomyelitis. 3. 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 4. Thirteen month stability RIGHT middle lobe pulmonary nodules. Recommend follow-up CT chest in 1 year to complete standard recommendation for 2 year stability. Aortic Atherosclerosis (ICD10-I70.0). Electronically Signed   By: Elon Alas M.D.   On: 11/27/2016 19:37    Assessment/Plan 1. Extensive osteomyelitis/diskitis of L2-3.  We are aware of request for disc aspirate.  Unfortunately, she is on coumadin and her INR is 3.45.  This will need to normalize prior to an aspiration.  We will likely try to plan for this procedure on Monday if we are able and her INR is closer to 1.5.  I explained the procedure to the patient and she is agreeable to proceed, but will not sign her consent form without her daughter's presence.  We will reattempt consent later.   Thank you for this  interesting consult.  I greatly enjoyed meeting [name] and look forward to participating in their care.  A copy of this report was sent to the requesting provider on this date.  Electronically Signed: Henreitta Cea 11/28/2016, 10:10 AM   I spent a total of    25 Minutes in face to face in clinical consultation, greater than 50% of which was counseling/coordinating care for diskitis of L2-3

## 2016-11-28 NOTE — Progress Notes (Signed)
Patient has several areas of skin breakdown: missing pinky toe on left foot; missing 4th toe on Right foot with eshcar on big toe, pressure ulcer to heel.  There are two place of skin breakdown on her sacrum midline, reinforced with pink foam.  She has scarring to her abdomen lower bilateral due to previous skin breakdown with MASD. MASD in the perineum and sacral area.  Bilateral skin tears to both arms.

## 2016-11-28 NOTE — Consult Note (Signed)
[facility] Renal Consultation Note  Indication for Consultation:  Management of ESRD/hemodialysis; anemia, hypertension/volume and secondary hyperparathyroidism  HPI: [name] is a 83 y.o. female  With ESRD Reported 2/2 DM (followed by Dr. Candiss Norse [facility]  On MWF [facility] )  On chronic HD past 3 years admitted with  diskitis / osteomyelitis of L2-L3. Noted she has recently admitted to   [facility] with  diskitis / osteomyelitis of L2-L3.  Aspirate in March demonstrated GPC in pairs on GS but cultures without growth.  She was put on vancomycin with dialysis.  Despite this her back pain has worsened and she presented to the ED at [facility] once again today.  Work up there includes CT abd/pelvis which demonstrates worsening of her osteomyelitis. Noted ID consulted and recommended no ABx at this time and IR aspirate for repeat culture.  Patient transferred to [facility] for IR eval and plan for aspiration with bacterial and fungal cultures.Noted INR 3.45 today  Currently reports back discomfort ,had last HD yesterday on schedule . Tells me has very little Urine output and lives in [place] [facility] now previously  With daughter and son in law.  Grew up in [place], [place], graduated [employer], did cosmetology and worked in Designer, fashion/clothing.  Had 4 kids, husband died about 6 yrs ago.  Lives w/ one of her daughters in [place]. Kidney doctor is Dr Rolly Salter.  Gets HD MWF schedule. On HD x 3.5 yrs, RUA AV fistula.  Diabetic kidney failure.     Past Medical History:  Diagnosis Date  . Anemia   . Anxiety   . Arthritis    gout  . Atrial fibrillation ([place])   . CHF (congestive heart failure) ([place])    has had this off and on prior to dialysis but has had several times since  . Chronic kidney disease    end stage renal insufficiency.  on dialysis M,W,F  . Diabetes mellitus without complication ([place])   . Dialysis patient [facility])    Mon.- Wed- Fri.  . Dysrhythmia     afib/flutter  . GERD (gastroesophageal reflux disease)   . Hypertension   . Neuropathy due to type 2 diabetes mellitus ([place])   . Peripheral vascular disease ([place])   . Pleural effusion   . Pulmonary hypertension ([place])   . Renal insufficiency   . Restless leg syndrome   . Shortness of breath dyspnea    with exertion  . Stroke ([place]) 06/2015   has had 1 other stroke. first one affected vision. the most recent one affected right arm    Past Surgical History:  Procedure Laterality Date  . AMPUTATION Right 11/07/2015   Procedure: AMPUTATION DIGIT ( 4TH TOE, RIGHT FOOT );  Surgeon: Algernon Huxley, MD;  Location: [facility] ORS;  Service: Vascular;  Laterality: Right;  . AMPUTATION TOE Left 06/07/2015   Procedure: AMPUTATION TOE;  Surgeon: Samara Deist, DPM;  Location: [facility] ORS;  Service: Podiatry;  Laterality: Left;  . CARDIAC CATHETERIZATION    . CATARACT EXTRACTION, BILATERAL Bilateral   . CHOLECYSTECTOMY    . DILATION AND CURETTAGE OF UTERUS    . ENDARTERECTOMY Left 07/26/2015   Procedure: ENDARTERECTOMY CAROTID;  Surgeon: Katha Cabal, MD;  Location: [facility] ORS;  Service: Vascular;  Laterality: Left;  . EYE SURGERY    . FISTULAGRAM ([place] HX)    . HEEL DECUBITUS ULCER EXCISION Right 05/13/2016  . INCISION AND DRAINAGE ABSCESS Right 02/16/2016  Procedure: INCISION AND DRAINAGE ABSCESS and application wound vac;  Surgeon: Evaristo Bury, MD;  Location: [facility] ORS;  Service: Urology;  Laterality: Right;  . IR GENERIC HISTORICAL  08/14/2016   IR FLUORO GUIDED NEEDLE PLC ASPIRATION/INJECTION LOC 08/14/2016 [facility]-INTERV RAD  . JOINT REPLACEMENT     bilateral hip replacement  . LOWER EXTREMITY ANGIOGRAPHY Right 10/29/2016   Procedure: Lower Extremity Angiography;  Surgeon: Algernon Huxley, MD;  Location: [place] CV LAB;  Service: Cardiovascular;  Laterality: Right;  . LOWER EXTREMITY INTERVENTION  10/29/2016   Procedure: Lower Extremity Intervention;  Surgeon: Algernon Huxley, MD;  Location: [place]  CV LAB;  Service: Cardiovascular;;  . MEDTRONIC BLADDER INTERSTEM     currently turned off for MRI in January  . PARATHYROIDECTOMY    . PERIPHERAL VASCULAR CATHETERIZATION N/A 10/08/2014   Procedure: A/V Shuntogram/Fistulagram;  Surgeon: Algernon Huxley, MD;  Location: [place] CV LAB;  Service: Cardiovascular;  Laterality: N/A;  . PERIPHERAL VASCULAR CATHETERIZATION N/A 03/04/2015   Procedure: A/V Shuntogram/Fistulagram;  Surgeon: Algernon Huxley, MD;  Location: [place] CV LAB;  Service: Cardiovascular;  Laterality: N/A;  . PERIPHERAL VASCULAR CATHETERIZATION N/A 03/04/2015   Procedure: A/V Shunt Intervention;  Surgeon: Algernon Huxley, MD;  Location: [place] CV LAB;  Service: Cardiovascular;  Laterality: N/A;  . PERIPHERAL VASCULAR CATHETERIZATION N/A 09/16/2015   Procedure: Lower Extremity Angiography;  Surgeon: Algernon Huxley, MD;  Location: [place] CV LAB;  Service: Cardiovascular;  Laterality: N/A;  . PERIPHERAL VASCULAR CATHETERIZATION  09/16/2015   Procedure: Lower Extremity Intervention;  Surgeon: Algernon Huxley, MD;  Location: [place] CV LAB;  Service: Cardiovascular;;  . PERIPHERAL VASCULAR CATHETERIZATION Right 04/27/2016   Procedure: A/V Shuntogram/Fistulagram;  Surgeon: Algernon Huxley, MD;  Location: [place] CV LAB;  Service: Cardiovascular;  Laterality: Right;  . PERIPHERAL VASCULAR CATHETERIZATION Left 05/14/2016   Procedure: DIALYSIS/PERMA CATHETER INSERTION;  Surgeon: Algernon Huxley, MD;  Location: [facility] ORS;  Service: Vascular;  Laterality: Left;  . PERIPHERAL VASCULAR CATHETERIZATION N/A 06/08/2016   Procedure: Dialysis/Perma Catheter Removal;  Surgeon: Algernon Huxley, MD;  Location: [place] CV LAB;  Service: Cardiovascular;  Laterality: N/A;  . REVISON OF ARTERIOVENOUS FISTULA Right 05/14/2016   Procedure: REVISON OF ARTERIOVENOUS FISTULA ( WITH ARTEGRAFT );  Surgeon: Algernon Huxley, MD;  Location: [facility] ORS;  Service: Vascular;  Laterality: Right;  . THYROIDECTOMY,  PARTIAL    . TONSILLECTOMY    . WOUND DEBRIDEMENT Right 11/07/2015   Procedure: DEBRIDEMENT WOUND ( RIGHT HEEL AND ABDOMINAL WOUND (2) );  Surgeon: Algernon Huxley, MD;  Location: [facility] ORS;  Service: Vascular;  Laterality: Right;  . WOUND DEBRIDEMENT N/A 11/28/2015   Procedure: DEBRIDEMENT WOUND ( ABDOMINAL );  Surgeon: Algernon Huxley, MD;  Location: [facility] ORS;  Service: Vascular;  Laterality: N/A;      Family History  Problem Relation Age of Onset  . Colon cancer Mother   . Stroke Father   . Diabetes Brother       reports that she has never smoked. She has never used smokeless tobacco. She reports that she does not drink alcohol or use drugs.   Allergies  Allergen Reactions  . Codeine Itching and Other (See Comments)    Reaction:  Confusion/Hallucinations   . Contrast Media [Iodinated Diagnostic Agents] Other (See Comments)    "skin peel" breaks into a blistered rash, then peel. If given Benadryl and solumedrol she will be okay  . Oxycodone  Other (See Comments)    Reaction:  Confusion/Hallucinations   . Quinine Derivatives Rash  . Tape Other (See Comments)    Skin is very thin and will blister and pull off skin with any tape except paper tape.  PLEASE USE PAPER TAPE.    Prior to Admission medications   Medication Sig Start Date End Date Taking? Authorizing Provider  acetaminophen (TYLENOL) 650 MG CR tablet Take 1,300 mg by mouth 2 (two) times daily.    [provider]  albuterol (PROVENTIL HFA;VENTOLIN HFA) 108 (90 Base) MCG/ACT inhaler Inhale 1 puff into the lungs every 6 (six) hours as needed for wheezing or shortness of breath.     [provider]  Biotin 1 MG CAPS Take 1 mg by mouth daily.     [provider]  calcium acetate (PHOSLO) 667 MG tablet Take 1,334 mg by mouth 3 (three) times daily with meals. With any food    [provider]  cyclobenzaprine (FLEXERIL) 5 MG tablet Take 1 tablet (5 mg total) by mouth 3 (three) times daily as needed for  muscle spasms. 11/19/16   Max Sane, MD  diltiazem (CARDIZEM CD) 120 MG 24 hr capsule Take 120 mg by mouth at bedtime.  10/15/16   [provider]  folic acid-vitamin b complex-vitamin c-selenium-zinc (DIALYVITE) 3 MG TABS tablet Take 1 tablet by mouth daily.    [provider]  furosemide (LASIX) 80 MG tablet Take 80 mg by mouth 2 (two) times daily.    [provider]  gabapentin (NEURONTIN) 100 MG capsule Take 100 mg by mouth every Tuesday, Thursday, Saturday, and Sunday. At bedtime    [provider]  gabapentin (NEURONTIN) 300 MG capsule Take 300 mg by mouth every Monday, Wednesday, and Friday at 8 PM. At bedtime    [provider]  insulin detemir (LEVEMIR) 100 UNIT/ML injection Inject 0.06 mLs (6 Units total) into the skin 2 (two) times daily. Patient taking differently: Inject 10 Units into the skin at bedtime.  02/18/16   Bettey Costa, MD  lidocaine (LIDODERM) 5 % Place 1 patch onto the skin daily. Remove & Discard patch within 12 hours or as directed by MD 11/19/16   Max Sane, MD  lidocaine-prilocaine (EMLA) cream Apply 1 application topically as needed (port access). Mon, Wed, Fri before dialysis    [provider]  Omega-3 Fatty Acids (FISH OIL) 1000 MG CAPS Take 1,000 mg by mouth 2 (two) times daily.    [provider]  oxyCODONE (OXY IR/ROXICODONE) 5 MG immediate release tablet Take 1 tablet (5 mg total) by mouth every 4 (four) hours as needed for severe pain. 11/19/16   Max Sane, MD  PARoxetine (PAXIL) 40 MG tablet Take 40 mg by mouth at bedtime.     [provider]  polyethylene glycol (MIRALAX / GLYCOLAX) packet Take 17 g by mouth as needed.     [provider]  pravastatin (PRAVACHOL) 20 MG tablet Take 20 mg by mouth every evening.     [provider]  ranitidine (ZANTAC) 150 MG tablet Take 150 mg by mouth at bedtime.     [provider]  senna-docusate (SENOKOT-S) 8.6-50 MG tablet  Take 2 tablets by mouth 2 (two) times daily. 11/19/16   Max Sane, MD  traMADol (ULTRAM) 50 MG tablet Take 1 tablet (50 mg total) by mouth every 6 (six) hours as needed for moderate pain. 11/19/16   Max Sane, MD  vancomycin (VANCOCIN) 1-5 GM/200ML-% SOLN  Inject 200 mLs (1,000 mg total) into the vein every Monday, Wednesday, and Friday with hemodialysis. She will need this for at least 12 weeks, further course to be determined by infectious disease Dr. Ola Spurr 11/20/16   Max Sane, MD  warfarin (COUMADIN) 2.5 MG tablet Take 2.5 mg by mouth daily at 6 PM.     [provider]     Anti-infectives    None      Results for orders placed or performed during the hospital encounter of 11/28/16 (from the past 48 hour(s))  Protime-INR     Status: Abnormal   Collection Time: 11/28/16  3:00 AM  Result Value Ref Range   Prothrombin Time 35.6 (H) 11.4 - 15.2 seconds   INR 3.45   Glucose, capillary     Status: None   Collection Time: 11/28/16  4:10 AM  Result Value Ref Range   Glucose-Capillary 94 65 - 99 mg/dL  Glucose, capillary     Status: None   Collection Time: 11/28/16  6:45 AM  Result Value Ref Range   Glucose-Capillary 98 65 - 99 mg/dL  Basic metabolic panel     Status: Abnormal   Collection Time: 11/28/16  8:14 AM  Result Value Ref Range   Sodium 130 (L) 135 - 145 mmol/L   Potassium 3.5 3.5 - 5.1 mmol/L   Chloride 97 (L) 101 - 111 mmol/L   CO2 23 22 - 32 mmol/L   Glucose, Bld 87 65 - 99 mg/dL   BUN 16 6 - 20 mg/dL   Creatinine, Ser 3.16 (H) 0.44 - 1.00 mg/dL   Calcium 8.4 (L) 8.9 - 10.3 mg/dL   GFR calc non Af Amer 13 (L) >60 mL/min   GFR calc Af Amer 15 (L) >60 mL/min    Comment: (NOTE) The eGFR has been calculated using the CKD EPI equation. This calculation has not been validated in all clinical situations. eGFR's persistently <60 mL/min signify possible Chronic Kidney Disease.    Anion gap 10 5 - 15  Glucose, capillary     Status: None   Collection Time:  11/28/16 11:14 AM  Result Value Ref Range   Glucose-Capillary 73 65 - 99 mg/dL    ROS: see positives in hpi   Physical Exam: Vitals:   11/28/16 0700 11/28/16 1021  BP: (!) 143/84 (!) 146/56  Pulse: 98 92  Resp: 20 20  Temp: 99.1 F (37.3 C) 98 F (36.7 C)     General: alert, elderly WF  Thin  HEENT:  , MM dry . EOMI  Neck: supple , no jvd Heart: RRR No m, r, gallop Lungs: CTA  bilat , non labored breathing   Abdomen: BS pos. , soft NT, ND Extremities: No pedal edema/ R 4th toe amp. Skin: no overt rash , R  grt toe tip dry dark lesion  Neuro: Alert , OX3 , moves all extrem to commands Dialysis Access:  RUA AVF  Pos. Bruit   Dialysis Orders: Center: [place]   on [place]  . [place]  67kg HD Bath 2k, 2.5 ca   Time  3hrs  Heparin 2000 bolus  with infusion hourly 400   . Access RUA AVF  15 g  BFR 450 DFR 800     Epogen 2200   Units IV/HD  Venofer  59m weekly  Hd / NO vit d listed    Assessment/Plan 1. Osteomyelitis/diskitis of L2-3- per admit team = IR  And ID  seeing 2. ESRD - HD  (MWF schedule Dr.Singh follows as OP )next hd will be Mon 7/02) still takes lasix but no urine outpt. Wit dc  3. Hypertension/volume  - bp stable  And vol  / on cardizem 4. Anemia  -  hgb 12.3 hold esa for now /  fe  Hold with osteom infection  5. Metabolic bone disease -  No vit d  , binder phoslo  6. Nutrition - renal /carb mod diet  7. HO Chronic  A. Fib  (on Coumadin )  inr  3.45  Per admit  8. DM type 2 -per admit   Ernest Haber, PA-C [place] [phone] 11/28/2016, 11:26 AM   Pt seen, examined, agree w assess/plan as above with additions as indicated.  Kelly Splinter MD [employer] pager [phone]    cell [phone] 11/28/2016, 3:18 PM

## 2016-11-29 ENCOUNTER — Inpatient Hospital Stay (HOSPITAL_COMMUNITY): Payer: Medicare Other

## 2016-11-29 DIAGNOSIS — R296 Repeated falls: Secondary | ICD-10-CM

## 2016-11-29 DIAGNOSIS — Z9181 History of falling: Secondary | ICD-10-CM

## 2016-11-29 DIAGNOSIS — E08621 Diabetes mellitus due to underlying condition with foot ulcer: Secondary | ICD-10-CM

## 2016-11-29 DIAGNOSIS — M86171 Other acute osteomyelitis, right ankle and foot: Secondary | ICD-10-CM

## 2016-11-29 DIAGNOSIS — Z833 Family history of diabetes mellitus: Secondary | ICD-10-CM

## 2016-11-29 DIAGNOSIS — E11621 Type 2 diabetes mellitus with foot ulcer: Secondary | ICD-10-CM

## 2016-11-29 DIAGNOSIS — M86671 Other chronic osteomyelitis, right ankle and foot: Secondary | ICD-10-CM

## 2016-11-29 DIAGNOSIS — L97414 Non-pressure chronic ulcer of right heel and midfoot with necrosis of bone: Secondary | ICD-10-CM

## 2016-11-29 DIAGNOSIS — M4646 Discitis, unspecified, lumbar region: Secondary | ICD-10-CM

## 2016-11-29 DIAGNOSIS — Z800 Family history of malignant neoplasm of digestive organs: Secondary | ICD-10-CM

## 2016-11-29 DIAGNOSIS — E1169 Type 2 diabetes mellitus with other specified complication: Secondary | ICD-10-CM

## 2016-11-29 DIAGNOSIS — L97519 Non-pressure chronic ulcer of other part of right foot with unspecified severity: Secondary | ICD-10-CM

## 2016-11-29 DIAGNOSIS — Z823 Family history of stroke: Secondary | ICD-10-CM

## 2016-11-29 DIAGNOSIS — E1151 Type 2 diabetes mellitus with diabetic peripheral angiopathy without gangrene: Secondary | ICD-10-CM

## 2016-11-29 LAB — BASIC METABOLIC PANEL
Anion gap: 12 (ref 5–15)
BUN: 24 mg/dL — ABNORMAL HIGH (ref 6–20)
CALCIUM: 8.2 mg/dL — AB (ref 8.9–10.3)
CHLORIDE: 96 mmol/L — AB (ref 101–111)
CO2: 20 mmol/L — AB (ref 22–32)
CREATININE: 3.94 mg/dL — AB (ref 0.44–1.00)
GFR calc non Af Amer: 10 mL/min — ABNORMAL LOW (ref >60)
GFR, EST AFRICAN AMERICAN: 11 mL/min — AB (ref >60)
GLUCOSE: 99 mg/dL (ref 65–99)
Potassium: 3.8 mmol/L (ref 3.5–5.1)
Sodium: 128 mmol/L — ABNORMAL LOW (ref 135–145)

## 2016-11-29 LAB — PROTIME-INR
INR: 2.79
PROTHROMBIN TIME: 30 s — AB (ref 11.4–15.2)

## 2016-11-29 LAB — GLUCOSE, CAPILLARY
Glucose-Capillary: 83 mg/dL (ref 65–99)
Glucose-Capillary: 100 mg/dL — ABNORMAL HIGH (ref 65–99)
Glucose-Capillary: 116 mg/dL — ABNORMAL HIGH (ref 65–99)

## 2016-11-29 NOTE — Progress Notes (Signed)
PROGRESS NOTE    [name]   ZOX:[id number]  DOB: 12/06/1933  DOA: 11/28/2016 PCP: [name], MD   Brief Narrative:  [name] is a 83 y.o. female with medical history significant of ESRD dialysis on MWF, A.Fib on coumadin, HTN.  Patient is being treated for diskitis / osteomyelitis of L2-L3 with Vancomycin with dialysis. Aspirate in March demonstrated GPC in pairs on GS but cultures without growth. Despite treatment, her back pain has worsened and she presented to the ED at [facility].    CT reveals worsening L2-L3 discitis/ osteomyelitis with severe canal stenosis.  She was seen by ID on 6/29. They recommended to hold all antibiotics for a repeat aspiration of disc space and culture as soon as possible.   Subjective: She had some back pain earlier today but this has resolved.  ROS:  No nausea, vomiting, chest pain, dyspnea, cough, constipation or diarrhea. No other complaints.   Assessment & Plan:   Principal Problem:   Discitis and Osteomyelitis of lumbar spine  - worsening L2-L3 discitis/ osteomyelitis with severe canal stenosis on CT on 6/29 - appreciate ID eval- holding antibiotics for now- previously on Vanc - IR will aspirate when INR has comes down- Vit K given- follow INR - blood cultures to be sent if patient develops a fever - needs to wear back brace which she has been resistant to wear- RN has contacted daughter to bring it into the hospital so she does not get charged for a new one  Active Problems:   ESRD on dialysis   - MWF dialysis - appreciate nephrology follow up  Early cirrhosis - noted on CT 6/29  Right middle lobe pulm nodules - stable from last imaging 13 mo ago- f/u Ct in 1 yr  dCHF- chronic - on lasix 80 BID as outpt - does not make urine and therefore discontinued -  mod conc hypertrophy on last ECHO 1/17    Diabetes mellitus 2 - low dose SSI- Levemir on hold    Chronic atrial fibrillation  - CHA2DS2-VASc Score at least 6 -  Coumadin on hold now - cont Cardizem  Supratherapeutic INR - INR 3.45 >> 2.79 -  given Vit K 2 mg given on 6/30  Hypokalemia - resolved  Hyponatremia - correct with dialysis / per nephro  Right sciatic nerve stimulator   DVT prophylaxis: SCDs- INR still therapeutic Code Status: DNR Family Communication:  Disposition Plan:  Consultants:   ID  IR Procedures:     Antimicrobials:  Anti-infectives    None       Objective: Vitals:   11/28/16 1752 11/28/16 2100 11/29/16 0500 11/29/16 0955  BP: 136/65 (!) 158/66 (!) 136/57 (!) 152/62  Pulse: 86 96 82 77  Resp: 18 18 18 20   Temp: 98.7 F (37.1 C) 97.8 F (36.6 C) 98.1 F (36.7 C) 98 F (36.7 C)  TempSrc: Oral Oral Oral Oral  SpO2: 99% 100% 100% 100%  Weight:      Height:       No intake or output data in the 24 hours ending 11/29/16 1032 Filed Weights   11/28/16 0100  Weight: 66.7 kg (147 lb)    Examination: General exam: Appears comfortable  HEENT: PERRLA, oral mucosa moist, no sclera icterus or thrush Respiratory system: Clear to auscultation. Respiratory effort normal. Cardiovascular system: S1 & S2 heard, RRR.  No murmurs  Gastrointestinal system: Abdomen soft, non-tender, nondistended. Normal bowel sound. No organomegaly Central nervous system: Alert and oriented.  No focal neurological deficits. Extremities: No cyanosis, clubbing or edema MSK- tenderness in lower back noted.  Skin: No rashes or ulcers Psychiatry:  Mood & affect appropriate.     Data Reviewed: I have personally reviewed following labs and imaging studies  CBC:  Recent Labs Lab 11/27/16 1617  WBC 7.5  HGB 12.3  HCT 36.8  MCV 92.2  PLT 282   Basic Metabolic Panel:  Recent Labs Lab 11/27/16 1617 11/28/16 0814 11/29/16 0213  NA 131* 130* 128*  K 2.9* 3.5 3.8  CL 94* 97* 96*  CO2 27 23 20*  GLUCOSE 91 87 99  BUN 13 16 24*  CREATININE 2.26* 3.16* 3.94*  CALCIUM 9.0 8.4* 8.2*   GFR: Estimated Creatinine  Clearance: 10.1 mL/min (A) (by C-G formula based on SCr of 3.94 mg/dL (H)). Liver Function Tests:  Recent Labs Lab 11/27/16 1617  AST 27  ALT 16  ALKPHOS 93  BILITOT 0.9  PROT 7.3  ALBUMIN 3.2*    Recent Labs Lab 11/27/16 1617  LIPASE 18   No results for input(s): AMMONIA in the last 168 hours. Coagulation Profile:  Recent Labs Lab 11/28/16 0300 11/29/16 0213  INR 3.45 2.79   Cardiac Enzymes: No results for input(s): CKTOTAL, CKMB, CKMBINDEX, TROPONINI in the last 168 hours. BNP (last 3 results) No results for input(s): PROBNP in the last 8760 hours. HbA1C: No results for input(s): HGBA1C in the last 72 hours. CBG:  Recent Labs Lab 11/28/16 0410 11/28/16 0645 11/28/16 1114 11/28/16 1617 11/29/16 0757  GLUCAP 94 98 73 90 83   Lipid Profile: No results for input(s): CHOL, HDL, LDLCALC, TRIG, CHOLHDL, LDLDIRECT in the last 72 hours. Thyroid Function Tests: No results for input(s): TSH, T4TOTAL, FREET4, T3FREE, THYROIDAB in the last 72 hours. Anemia Panel: No results for input(s): VITAMINB12, FOLATE, FERRITIN, TIBC, IRON, RETICCTPCT in the last 72 hours. Urine analysis:    Component Value Date/Time   COLORURINE AMBER (A) 08/09/2016 2204   APPEARANCEUR TURBID (A) 08/09/2016 2204   LABSPEC 1.014 08/09/2016 2204   PHURINE 6.0 08/09/2016 2204   GLUCOSEU NEGATIVE 08/09/2016 2204   HGBUR MODERATE (A) 08/09/2016 2204   BILIRUBINUR NEGATIVE 08/09/2016 2204   KETONESUR NEGATIVE 08/09/2016 2204   PROTEINUR 100 (A) 08/09/2016 2204   NITRITE NEGATIVE 08/09/2016 2204   LEUKOCYTESUR LARGE (A) 08/09/2016 2204   Sepsis Labs: @LABRCNTIP (procalcitonin:4,lacticidven:4) )No results found for this or any previous visit (from the past 240 hour(s)).       Radiology Studies: Ct Abdomen Pelvis Wo Contrast  Result Date: 11/27/2016 CLINICAL DATA:  Acute on chronic low back pain, abdominal for 2 weeks after dialysis. History of hypertension, diabetes, cholecystectomy, and  lumbar discitis osteomyelitis. EXAM: CT ABDOMEN AND PELVIS WITHOUT CONTRAST TECHNIQUE: Multidetector CT imaging of the abdomen and pelvis was performed following the standard protocol without IV contrast. Oral contrast administered. COMPARISON:  CT abdomen and pelvis August 09, 2016 and Oct 12, 2015 FINDINGS: LOWER CHEST: Small pleural effusions. Stable 8 mm and 7 mm RIGHT middle lobe pulmonary nodules stable from Oct 12, 2015. Bibasilar atelectasis. The heart is mildly enlarged. Severe mitral annular calcifications. No pericardial effusion. HEPATOBILIARY: Mildly dense and slightly nodular liver. Status post cholecystectomy. PANCREAS: Normal. SPLEEN: Normal. ADRENALS/URINARY TRACT: Kidneys are orthotopic, Atrophic kidneys bilaterally. Stable 13 mm RIGHT upper pole calcifications without obstructive uropathy. Limited assessment for mass on noncontrast CT. Thickened adrenal gland compatible with hyperplasia decompressed urinary bladder predominately obscured. STOMACH/BOWEL: Small hiatal hernia. The stomach, small and large bowel are  normal in course and caliber without inflammatory changes. Mild colonic diverticulosis. Normal appendix. VASCULAR/LYMPHATIC: Aortoiliac vessels are normal in course and caliber, severe. No lymphadenopathy by CT size criteria. REPRODUCTIVE: Normal. OTHER: No intraperitoneal free fluid or free air. MUSCULOSKELETAL: Increasing disc widening L2-3 associated with endplate erosion and sclerosis. Severe canal stenosis at L2-3. Bilateral hip total arthroplasties resultant streak artifact limiting assessment of the pelvis. RIGHT sciatic nerve stimulator. Trophic pelvic musculature. Small fat containing umbilical hernia. IMPRESSION: 1. No acute intra-abdominopelvic process by noncontrast CT. 2. Worsening L2-3 discitis osteomyelitis. 3. 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 4. Thirteen month stability RIGHT middle lobe pulmonary nodules. Recommend follow-up CT chest in 1 year to complete standard  recommendation for 2 year stability. Aortic Atherosclerosis (ICD10-I70.0). Electronically Signed   By: Awilda Metroourtnay  Bloomer M.D.   On: 11/27/2016 19:37      Scheduled Meds: . acetaminophen  650 mg Oral QID  . calcium acetate  1,334 mg Oral TID WC  . diltiazem  120 mg Oral QHS  . famotidine  20 mg Oral QHS  . gabapentin  100 mg Oral Once per day on Sun Tue Thu Sat  . [START ON 11/30/2016] gabapentin  300 mg Oral Once per day on Mon Wed Fri  . insulin aspart  0-9 Units Subcutaneous TID WC  . lidocaine  1 patch Transdermal Q24H  . multivitamin  1 tablet Oral Daily  . omega-3 acid ethyl esters  1,000 mg Oral Daily  . PARoxetine  40 mg Oral QHS  . pravastatin  20 mg Oral QPM  . senna-docusate  2 tablet Oral BID   Continuous Infusions:   LOS: 1 day    Time spent in minutes: 35    Calvert CantorSaima Juanda Luba, MD [facility] Pager: [email or web address] Password [email or web address] 11/29/2016, 10:32 AM

## 2016-11-29 NOTE — Progress Notes (Signed)
Patient refusing SCD's and educated of risk; need for VTE prevention explained. Patient refuses due to leg discomfort.

## 2016-11-29 NOTE — Progress Notes (Signed)
Subjective:  Appear more comfortable and sleepy today / noted  INR still Up awaking IR  Aspiration / for HD in AM MWF HD schedule   Objective Vital signs in last 24 hours: Vitals:   11/28/16 1752 11/28/16 2100 11/29/16 0500 11/29/16 0955  BP: 136/65 (!) 158/66 (!) 136/57 (!) 152/62  Pulse: 86 96 82 77  Resp: 18 18 18 20   Temp: 98.7 F (37.1 C) 97.8 F (36.6 C) 98.1 F (36.7 C) 98 F (36.7 C)  TempSrc: Oral Oral Oral Oral  SpO2: 99% 100% 100% 100%  Weight:      Height:       Weight change:   Physical Exam: General:  Sl more lethargic but OX3 when awoken  / nad  Heart: RRR No m, r, gallop Lungs: CTA  bilat , non labored breathing   Abdomen: BS pos. , soft NT, ND Extremities: No pedal edema/ R 4th toe amp. Dialysis Access:  RUA AVF  Pos. Bruit   Dialysis Orders: [facility]  [place]  [place]  16XW67kg HD Bath 2k, 2.5 ca   Time  3hrs  Heparin 2000 bolus  with infusion hourly 400   . Access RUA AVF  15 g  BFR 450 DFR 800     Epogen 2200   Units IV/HD  Venofer  50mg  weekly  Hd / NO vit d listed   Problem/Plan: 1. Osteomyelitis/diskitis of L2-3- per admit team and  IR  And ID seeing ( awaiting INR down for IR aspiration ) 2. ESRD - HD  (MWF schedule Dr.Singh follows as OP )next hd will be Mon 7/02)  3. Hypertension/volume  - bp stable  And vol  / on cardizem 4. Anemia  -  hgb 12.3 hold esa for now /  fe  Hold with osteom infection  5. Metabolic bone disease -  No vit d  , binder phoslo  6. Nutrition - renal /carb mod diet  7. HO Chronic  A. Fib  (on Coumadin )  inr  3.45  Per admit  8. DM type 2 -per admit   Lenny Pastelavid Zeyfang, PA-C [facility] [phone] 11/29/2016,10:59 AM  LOS: 1 day   Pt seen, examined and agree w A/P as above.  Vinson Moselleob Elliana Bal MD [facility] pager [phone]   11/29/2016, 12:21 PM    Labs: Basic Metabolic Panel:  Recent Labs Lab 11/27/16 1617 11/28/16 0814 11/29/16 0213  NA 131* 130*  128*  K 2.9* 3.5 3.8  CL 94* 97* 96*  CO2 27 23 20*  GLUCOSE 91 87 99  BUN 13 16 24*  CREATININE 2.26* 3.16* 3.94*  CALCIUM 9.0 8.4* 8.2*   Liver Function Tests:  Recent Labs Lab 11/27/16 1617  AST 27  ALT 16  ALKPHOS 93  BILITOT 0.9  PROT 7.3  ALBUMIN 3.2*    Recent Labs Lab 11/27/16 1617  LIPASE 18   No results for input(s): AMMONIA in the last 168 hours. CBC:  Recent Labs Lab 11/27/16 1617  WBC 7.5  HGB 12.3  HCT 36.8  MCV 92.2  PLT 282   Cardiac Enzymes: No results for input(s): CKTOTAL, CKMB, CKMBINDEX, TROPONINI in the last 168 hours. CBG:  Recent Labs Lab 11/28/16 0410 11/28/16 0645 11/28/16 1114 11/28/16 1617 11/29/16 0757  GLUCAP 94 98 73 90 83     Medications:  . acetaminophen  650 mg Oral QID  . calcium acetate  1,334 mg Oral  TID WC  . diltiazem  120 mg Oral QHS  . famotidine  20 mg Oral QHS  . gabapentin  100 mg Oral Once per day on Sun Tue Thu Sat  . [START ON 11/30/2016] gabapentin  300 mg Oral Once per day on Mon Wed Fri  . insulin aspart  0-9 Units Subcutaneous TID WC  . lidocaine  1 patch Transdermal Q24H  . multivitamin  1 tablet Oral Daily  . omega-3 acid ethyl esters  1,000 mg Oral Daily  . PARoxetine  40 mg Oral QHS  . pravastatin  20 mg Oral QPM  . senna-docusate  2 tablet Oral BID

## 2016-11-29 NOTE — Progress Notes (Signed)
INR 2.79.  Will follow this.  Needs to be around 1.5 in order to proceed.  Plan for Monday or Tuesday pending scheduling and INR.  Consent has been obtained from patient.  Melessia Kaus E 10:27 AM 11/29/2016

## 2016-11-29 NOTE — Consult Note (Addendum)
Date of Admission:  11/28/2016  Date of Consult:  11/29/2016  Reason for Consult: progressive vertebral osteomyelitis Referring Physician: Dr. Wynelle Cleveland   HPI: [name] is an 83 y.o. female. On HD who tells me she has not been able to stand for several years due to instability and has fallen on multiple occasions. She has PVD and nonhealing ulcers on right foot. She has osteomyelitis of the calcaneous with the posterior portion destroyed as seen on imaging in September of 2017.  She was diagnosed with vertebral osteomyelitis an diskitis in March. IR aspirate  -- after  linezolid, zosyn, ceftriaxone---had been given which yielded GP C pairs on GS but nothing grew on cultures. Dr. Esperanza Sheets treated her with a 6 week course of IV vancomycin, then changed to oral doxycyline. She then worsened and was actually restarted on IV vancomycin with HD by Dr. Ola Spurr on June 13th. She came in to [facility] again with abdominal pain, back pain. CT scan showed Increasing disc widening L2-3 associated with endplate erosion and sclerosis. Severe canal stenosis at L2-3.  I was called from [facility]. I recommended stopping IV abx and have IR radiology sample the disk, vertebral infected site for culture. Apparently this could not be done at [facility] and subsequently the patient was transferred to [facility].      Past Medical History:  Diagnosis Date  . Anemia   . Anxiety   . Arthritis    gout  . Atrial fibrillation ([place])   . CHF (congestive heart failure) ([place])    has had this off and on prior to dialysis but has had several times since  . Chronic kidney disease    end stage renal insufficiency.  on dialysis M,W,F  . Diabetes mellitus without complication ([place])   . Dialysis patient [facility])    Mon.- Wed- Fri.  . Dysrhythmia    afib/flutter  . GERD (gastroesophageal reflux disease)   . Hypertension   . Neuropathy due to type 2 diabetes mellitus ([place])   . Peripheral vascular disease ([place])   . Pleural  effusion   . Pulmonary hypertension ([place])   . Renal insufficiency   . Restless leg syndrome   . Shortness of breath dyspnea    with exertion  . Stroke ([place]) 06/2015   has had 1 other stroke. first one affected vision. the most recent one affected right arm    Past Surgical History:  Procedure Laterality Date  . AMPUTATION Right 11/07/2015   Procedure: AMPUTATION DIGIT ( 4TH TOE, RIGHT FOOT );  Surgeon: Algernon Huxley, MD;  Location: [facility] ORS;  Service: Vascular;  Laterality: Right;  . AMPUTATION TOE Left 06/07/2015   Procedure: AMPUTATION TOE;  Surgeon: Samara Deist, DPM;  Location: [facility] ORS;  Service: Podiatry;  Laterality: Left;  . CARDIAC CATHETERIZATION    . CATARACT EXTRACTION, BILATERAL Bilateral   . CHOLECYSTECTOMY    . DILATION AND CURETTAGE OF UTERUS    . ENDARTERECTOMY Left 07/26/2015   Procedure: ENDARTERECTOMY CAROTID;  Surgeon: Katha Cabal, MD;  Location: [facility] ORS;  Service: Vascular;  Laterality: Left;  . EYE SURGERY    . FISTULAGRAM ([place] HX)    . HEEL DECUBITUS ULCER EXCISION Right 05/13/2016  . INCISION AND DRAINAGE ABSCESS Right 02/16/2016   Procedure: INCISION AND DRAINAGE ABSCESS and application wound vac;  Surgeon: Evaristo Bury, MD;  Location: [facility] ORS;  Service: Urology;  Laterality: Right;  . IR GENERIC HISTORICAL  08/14/2016   IR  FLUORO GUIDED NEEDLE PLC ASPIRATION/INJECTION LOC 08/14/2016 [facility]-INTERV RAD  . JOINT REPLACEMENT     bilateral hip replacement  . LOWER EXTREMITY ANGIOGRAPHY Right 10/29/2016   Procedure: Lower Extremity Angiography;  Surgeon: Algernon Huxley, MD;  Location: [place] CV LAB;  Service: Cardiovascular;  Laterality: Right;  . LOWER EXTREMITY INTERVENTION  10/29/2016   Procedure: Lower Extremity Intervention;  Surgeon: Algernon Huxley, MD;  Location: [place] CV LAB;  Service: Cardiovascular;;  . MEDTRONIC BLADDER INTERSTEM     currently turned off for MRI in January  . PARATHYROIDECTOMY    . PERIPHERAL VASCULAR CATHETERIZATION N/A  10/08/2014   Procedure: A/V Shuntogram/Fistulagram;  Surgeon: Algernon Huxley, MD;  Location: [place] CV LAB;  Service: Cardiovascular;  Laterality: N/A;  . PERIPHERAL VASCULAR CATHETERIZATION N/A 03/04/2015   Procedure: A/V Shuntogram/Fistulagram;  Surgeon: Algernon Huxley, MD;  Location: [place] CV LAB;  Service: Cardiovascular;  Laterality: N/A;  . PERIPHERAL VASCULAR CATHETERIZATION N/A 03/04/2015   Procedure: A/V Shunt Intervention;  Surgeon: Algernon Huxley, MD;  Location: [place] CV LAB;  Service: Cardiovascular;  Laterality: N/A;  . PERIPHERAL VASCULAR CATHETERIZATION N/A 09/16/2015   Procedure: Lower Extremity Angiography;  Surgeon: Algernon Huxley, MD;  Location: [place] CV LAB;  Service: Cardiovascular;  Laterality: N/A;  . PERIPHERAL VASCULAR CATHETERIZATION  09/16/2015   Procedure: Lower Extremity Intervention;  Surgeon: Algernon Huxley, MD;  Location: [place] CV LAB;  Service: Cardiovascular;;  . PERIPHERAL VASCULAR CATHETERIZATION Right 04/27/2016   Procedure: A/V Shuntogram/Fistulagram;  Surgeon: Algernon Huxley, MD;  Location: [place] CV LAB;  Service: Cardiovascular;  Laterality: Right;  . PERIPHERAL VASCULAR CATHETERIZATION Left 05/14/2016   Procedure: DIALYSIS/PERMA CATHETER INSERTION;  Surgeon: Algernon Huxley, MD;  Location: [facility];  Service: Vascular;  Laterality: Left;  . PERIPHERAL VASCULAR CATHETERIZATION N/A 06/08/2016   Procedure: Dialysis/Perma Catheter Removal;  Surgeon: Algernon Huxley, MD;  Location: [place] CV LAB;  Service: Cardiovascular;  Laterality: N/A;  . REVISON OF ARTERIOVENOUS FISTULA Right 05/14/2016   Procedure: REVISON OF ARTERIOVENOUS FISTULA ( WITH ARTEGRAFT );  Surgeon: Algernon Huxley, MD;  Location: [facility];  Service: Vascular;  Laterality: Right;  . THYROIDECTOMY, PARTIAL    . TONSILLECTOMY    . WOUND DEBRIDEMENT Right 11/07/2015   Procedure: DEBRIDEMENT WOUND ( RIGHT HEEL AND ABDOMINAL WOUND (2) );  Surgeon: Algernon Huxley, MD;  Location: [facility];   Service: Vascular;  Laterality: Right;  . WOUND DEBRIDEMENT N/A 11/28/2015   Procedure: DEBRIDEMENT WOUND ( ABDOMINAL );  Surgeon: Algernon Huxley, MD;  Location: [facility] ORS;  Service: Vascular;  Laterality: N/A;    Social History:  reports that she has never smoked. She has never used smokeless tobacco. She reports that she does not drink alcohol or use drugs.   Family History  Problem Relation Age of Onset  . Colon cancer Mother   . Stroke Father   . Diabetes Brother     Allergies  Allergen Reactions  . Codeine Itching and Other (See Comments)    Reaction:  Confusion/Hallucinations   . Contrast Media [Iodinated Diagnostic Agents] Other (See Comments)    "skin peel" breaks into a blistered rash, then peel. If given Benadryl and solumedrol she will be okay  . Oxycodone Other (See Comments)    Reaction:  Confusion/Hallucinations   . Quinine Derivatives Rash  . Tape Other (See Comments)    Skin is very thin and will blister and pull off skin with  any tape except paper tape.  PLEASE USE PAPER TAPE.     Medications: I have reviewed patients current medications as documented in Epic Anti-infectives    None         ROS:  as in HPI otherwise remainder of 12 point Review of Systems is negative Blood pressure (!) 152/62, pulse 77, temperature 98 F (36.7 C), temperature source Oral, resp. rate 20, height 5' 5"  (1.651 m), weight 147 lb (66.7 kg), SpO2 100 %. General: Alert and awake, oriented x3, not in any acute distress. HEENT: anicteric sclera,  EOMI,  Cardiovascular: regular rate, normal r,  no murmur rubs or gallops Pulmonary: clear to auscultation bilaterally, no wheezing, rales or rhonchi Gastrointestinal: soft nontender, nondistended, normal bowel sounds, Musculoskeletal/skin   11/29/16: ulcer on ankle and on big toe        Neuro: nonfocal, strength and sensation intact   Results for orders placed or performed during the hospital encounter of 11/28/16 (from the past  48 hour(s))  Protime-INR     Status: Abnormal   Collection Time: 11/28/16  3:00 AM  Result Value Ref Range   Prothrombin Time 35.6 (H) 11.4 - 15.2 seconds   INR 3.45   Glucose, capillary     Status: None   Collection Time: 11/28/16  4:10 AM  Result Value Ref Range   Glucose-Capillary 94 65 - 99 mg/dL  Glucose, capillary     Status: None   Collection Time: 11/28/16  6:45 AM  Result Value Ref Range   Glucose-Capillary 98 65 - 99 mg/dL  Basic metabolic panel     Status: Abnormal   Collection Time: 11/28/16  8:14 AM  Result Value Ref Range   Sodium 130 (L) 135 - 145 mmol/L   Potassium 3.5 3.5 - 5.1 mmol/L   Chloride 97 (L) 101 - 111 mmol/L   CO2 23 22 - 32 mmol/L   Glucose, Bld 87 65 - 99 mg/dL   BUN 16 6 - 20 mg/dL   Creatinine, Ser 3.16 (H) 0.44 - 1.00 mg/dL   Calcium 8.4 (L) 8.9 - 10.3 mg/dL   GFR calc non Af Amer 13 (L) >60 mL/min   GFR calc Af Amer 15 (L) >60 mL/min    Comment: (NOTE) The eGFR has been calculated using the CKD EPI equation. This calculation has not been validated in all clinical situations. eGFR's persistently <60 mL/min signify possible Chronic Kidney Disease.    Anion gap 10 5 - 15  Glucose, capillary     Status: None   Collection Time: 11/28/16 11:14 AM  Result Value Ref Range   Glucose-Capillary 73 65 - 99 mg/dL  Glucose, capillary     Status: None   Collection Time: 11/28/16  4:17 PM  Result Value Ref Range   Glucose-Capillary 90 65 - 99 mg/dL  Protime-INR     Status: Abnormal   Collection Time: 11/29/16  2:13 AM  Result Value Ref Range   Prothrombin Time 30.0 (H) 11.4 - 15.2 seconds   INR 0.86   Basic metabolic panel     Status: Abnormal   Collection Time: 11/29/16  2:13 AM  Result Value Ref Range   Sodium 128 (L) 135 - 145 mmol/L   Potassium 3.8 3.5 - 5.1 mmol/L   Chloride 96 (L) 101 - 111 mmol/L   CO2 20 (L) 22 - 32 mmol/L   Glucose, Bld 99 65 - 99 mg/dL   BUN 24 (H) 6 - 20 mg/dL   Creatinine, Ser  3.94 (H) 0.44 - 1.00 mg/dL   Calcium  8.2 (L) 8.9 - 10.3 mg/dL   GFR calc non Af Amer 10 (L) >60 mL/min   GFR calc Af Amer 11 (L) >60 mL/min    Comment: (NOTE) The eGFR has been calculated using the CKD EPI equation. This calculation has not been validated in all clinical situations. eGFR's persistently <60 mL/min signify possible Chronic Kidney Disease.    Anion gap 12 5 - 15  Glucose, capillary     Status: None   Collection Time: 11/29/16  7:57 AM  Result Value Ref Range   Glucose-Capillary 83 65 - 99 mg/dL  Glucose, capillary     Status: Abnormal   Collection Time: 11/29/16 12:37 PM  Result Value Ref Range   Glucose-Capillary 100 (H) 65 - 99 mg/dL   @BRIEFLABTABLE (sdes,specrequest,cult,reptstatus)   ) Recent Results (from the past 720 hour(s))  Blood culture (routine x 2)     Status: None   Collection Time: 11/11/16  9:45 AM  Result Value Ref Range Status   Specimen Description BLOOD L AC  Final   Special Requests   Final    BOTTLES DRAWN AEROBIC AND ANAEROBIC Blood Culture adequate volume   Culture NO GROWTH 5 DAYS  Final   Report Status 11/16/2016 FINAL  Final  Blood culture (routine x 2)     Status: None   Collection Time: 11/11/16 10:00 AM  Result Value Ref Range Status   Specimen Description BLOOD L FA  Final   Special Requests   Final    BOTTLES DRAWN AEROBIC AND ANAEROBIC Blood Culture adequate volume   Culture NO GROWTH 5 DAYS  Final   Report Status 11/16/2016 FINAL  Final  MRSA PCR Screening     Status: None   Collection Time: 11/12/16 12:49 AM  Result Value Ref Range Status   MRSA by PCR NEGATIVE NEGATIVE Final    Comment:        The GeneXpert MRSA Assay (FDA approved for NASAL specimens only), is one component of a comprehensive MRSA colonization surveillance program. It is not intended to diagnose MRSA infection nor to guide or monitor treatment for MRSA infections.      Impression/Recommendation  Principal Problem:   Osteomyelitis of lumbar spine (HCC) Active Problems:   ESRD  on dialysis ([place])   Diabetes mellitus ([place])   Chronic atrial fibrillation (HCC)   [name] is a 83 y.o. female with  Inability to walk for several years with calcaneal osteomyelitis in right heel chronically, on HD with vertebral diskitis osteomyelitis rx with IV vancomycin (GP cocci in pairs seen but culture on abx negative)  #1 Vertebral diskitis, osteomyelitis worsening despite IV vancomycin x 6 weeks and another course started  --continue to hold antibiotics the longer the better the chance of recovering an organism. (IR currently waiting for INR to come down  (note she reportedly had myoclonus on ceftrazidime per Dr. Blane Ohara note from June 13th  If we get a gram + and cannot use a beta lactam would consider using daptomicin this time x 8 weeks  (would review the reaction to ceftazidime with daughter or others who witnessed this, I believe her podiatrist may have seen it)  #2 Calcaneal osteomyelitis: this could be the SOURCE of seeing of her spine. To prevent further seeding of her spine ,  Blood stream. I would favor a curative amputation but the patient would not want this. Even though she cannot walk without falling she enjoys being  able to stand. I will get plain films to see status of this radiographically regardles  I will also engage the Diabetic Foot Focused Order set  Dr. Megan Salon to take over the service tomorrow.     11/29/2016, 1:36 PM   Thank you so much for this interesting consult  [place] for [place] [phone] (pager) [phone] (office) 11/29/2016, 1:36 PM  [place] 11/29/2016, 1:36 PM

## 2016-11-29 NOTE — Progress Notes (Signed)
Patient refused midnight vital signs and cbg.

## 2016-11-30 LAB — BASIC METABOLIC PANEL
Anion gap: 12 (ref 5–15)
BUN: 35 mg/dL — ABNORMAL HIGH (ref 6–20)
CHLORIDE: 94 mmol/L — AB (ref 101–111)
CO2: 21 mmol/L — AB (ref 22–32)
CREATININE: 5.01 mg/dL — AB (ref 0.44–1.00)
Calcium: 8.1 mg/dL — ABNORMAL LOW (ref 8.9–10.3)
GFR calc Af Amer: 8 mL/min — ABNORMAL LOW (ref >60)
GFR calc non Af Amer: 7 mL/min — ABNORMAL LOW (ref >60)
Glucose, Bld: 108 mg/dL — ABNORMAL HIGH (ref 65–99)
POTASSIUM: 3.8 mmol/L (ref 3.5–5.1)
Sodium: 127 mmol/L — ABNORMAL LOW (ref 135–145)

## 2016-11-30 LAB — PROTIME-INR
INR: 1.68
Prothrombin Time: 20 seconds — ABNORMAL HIGH (ref 11.4–15.2)

## 2016-11-30 LAB — PREALBUMIN: PREALBUMIN: 12 mg/dL — AB (ref 18–38)

## 2016-11-30 LAB — C-REACTIVE PROTEIN: CRP: 5.1 mg/dL — AB (ref <1.0)

## 2016-11-30 LAB — GLUCOSE, CAPILLARY
GLUCOSE-CAPILLARY: 105 mg/dL — AB (ref 65–99)
Glucose-Capillary: 103 mg/dL — ABNORMAL HIGH (ref 65–99)
Glucose-Capillary: 91 mg/dL (ref 65–99)
Glucose-Capillary: 77 mg/dL (ref 65–99)
Glucose-Capillary: 91 mg/dL (ref 65–99)

## 2016-11-30 LAB — SEDIMENTATION RATE: Sed Rate: 37 mm/hr — ABNORMAL HIGH (ref 0–22)

## 2016-11-30 LAB — HIV ANTIBODY (ROUTINE TESTING W REFLEX): HIV SCREEN 4TH GENERATION: NONREACTIVE

## 2016-11-30 MED ORDER — COLLAGENASE 250 UNIT/GM EX OINT
TOPICAL_OINTMENT | Freq: Every day | CUTANEOUS | Status: DC
  Administered 2016-11-30 – 2016-12-01 (×2): via TOPICAL
  Administered 2016-12-02: 1 via TOPICAL
  Administered 2016-12-03 – 2016-12-10 (×8): via TOPICAL
  Filled 2016-11-30 (×2): qty 30

## 2016-11-30 MED ORDER — ENSURE ENLIVE PO LIQD
237.0000 mL | Freq: Two times a day (BID) | ORAL | Status: DC
  Administered 2016-12-01 – 2016-12-08 (×12): 237 mL via ORAL
  Filled 2016-11-30 (×18): qty 237

## 2016-11-30 NOTE — Progress Notes (Signed)
PROGRESS NOTE    [name]   UUV:[id number]  DOB: 1934-05-21  DOA: 11/28/2016 PCP: [name], MD   Brief Narrative:  [name] is a 83 y.o. female with medical history significant of ESRD dialysis on MWF, A.Fib on coumadin, HTN.  Patient is being treated for diskitis / osteomyelitis of L2-L3 with Vancomycin with dialysis. Aspirate in March demonstrated GPC in pairs on GS but cultures without growth. Despite treatment, her back pain has worsened and she presented to the ED at [facility].    CT reveals worsening L2-L3 discitis/ osteomyelitis with severe canal stenosis.  She was seen by ID on 6/29. They recommended to hold all antibiotics for a repeat aspiration of disc space and culture as soon as possible.   Subjective: Evaluated during dialysis- awaiting her procedure- no complaints of pain.  ROS:  No nausea, vomiting, chest pain, dyspnea, cough, constipation or diarrhea. No other complaints.   Assessment & Plan:   Principal Problem:   Discitis and Osteomyelitis of lumbar spine  - worsening L2-L3 discitis/ osteomyelitis with severe canal stenosis on CT on 6/29 - appreciate ID eval- holding antibiotics for now- previously on Vanc - IR will aspirate when INR has comes down to < 1.5 - Vit K given- INR I.6 today- IR plans to aspirate tomorrow - blood cultures to be sent if patient develops a fever - needs to wear back brace which she has been resistant to wear- RN has contacted daughter to bring it into the hospital so she does not get charged for a new one- Pt eval ordered  Active Problems:   Supratherapeutic INR - INR 3.45 >> 2.79>> 1.68 -  given Vit K 2 mg given on 6/30    ESRD on dialysis   - MWF dialysis - appreciate nephrology follow up  Early cirrhosis - noted on CT 6/29  Right middle lobe pulm nodules - stable from last imaging 13 mo ago- f/u Ct in 1 yr  dCHF- chronic - on lasix 80 BID as outpt - does not make urine and therefore discontinued -  mod conc  hypertrophy on last ECHO 1/17    Diabetes mellitus 2 - low dose SSI- Levemir on hold    Chronic atrial fibrillation  - CHA2DS2-VASc Score at least 6 - Coumadin on hold now - cont Cardizem  Hypokalemia - resolved  Hyponatremia - correct with dialysis / per nephro  Right sciatic nerve stimulator   DVT prophylaxis: SCDs- INR still therapeutic Code Status: DNR Family Communication:  Disposition Plan:  Consultants:   ID  IR Procedures:     Antimicrobials:  Anti-infectives    None       Objective: Vitals:   11/30/16 0900 11/30/16 0930 11/30/16 0951 11/30/16 1040  BP: (!) 142/68 138/67 (!) 150/74 (!) 145/57  Pulse: 81 84 81 80  Resp:   17 18  Temp:   98.6 F (37 C) 98.7 F (37.1 C)  TempSrc:   Oral Oral  SpO2:    98%  Weight:   65.4 kg (144 lb 2.9 oz)   Height:        Intake/Output Summary (Last 24 hours) at 11/30/16 1532 Last data filed at 11/30/16 0951  Gross per 24 hour  Intake                0 ml  Output             1000 ml  Net            -  1000 ml   Filed Weights   11/28/16 0100 11/30/16 0644 11/30/16 0951  Weight: 66.7 kg (147 lb) 66.8 kg (147 lb 4.3 oz) 65.4 kg (144 lb 2.9 oz)    Examination: General exam: Appears comfortable  HEENT: PERRLA, oral mucosa moist, no sclera icterus or thrush Respiratory system: Clear to auscultation. Respiratory effort normal. Cardiovascular system: S1 & S2 heard, RRR.  No murmurs  Gastrointestinal system: Abdomen soft, non-tender, nondistended. Normal bowel sound. No organomegaly Central nervous system: Alert and oriented. No focal neurological deficits. Extremities: No cyanosis, clubbing or edema MSK- tenderness in lower back noted.  Skin: No rashes or ulcers Psychiatry:  Mood & affect appropriate.     Data Reviewed: I have personally reviewed following labs and imaging studies  CBC:  Recent Labs Lab 11/27/16 1617  WBC 7.5  HGB 12.3  HCT 36.8  MCV 92.2  PLT 282   Basic Metabolic  Panel:  Recent Labs Lab 11/27/16 1617 11/28/16 0814 11/29/16 0213 11/30/16 0304  NA 131* 130* 128* 127*  K 2.9* 3.5 3.8 3.8  CL 94* 97* 96* 94*  CO2 27 23 20* 21*  GLUCOSE 91 87 99 108*  BUN 13 16 24* 35*  CREATININE 2.26* 3.16* 3.94* 5.01*  CALCIUM 9.0 8.4* 8.2* 8.1*   GFR: Estimated Creatinine Clearance: 7.9 mL/min (A) (by C-G formula based on SCr of 5.01 mg/dL (H)). Liver Function Tests:  Recent Labs Lab 11/27/16 1617  AST 27  ALT 16  ALKPHOS 93  BILITOT 0.9  PROT 7.3  ALBUMIN 3.2*    Recent Labs Lab 11/27/16 1617  LIPASE 18   No results for input(s): AMMONIA in the last 168 hours. Coagulation Profile:  Recent Labs Lab 11/28/16 0300 11/29/16 0213 11/30/16 0304  INR 3.45 2.79 1.68   Cardiac Enzymes: No results for input(s): CKTOTAL, CKMB, CKMBINDEX, TROPONINI in the last 168 hours. BNP (last 3 results) No results for input(s): PROBNP in the last 8760 hours. HbA1C: No results for input(s): HGBA1C in the last 72 hours. CBG:  Recent Labs Lab 11/29/16 1237 11/29/16 1614 11/30/16 0220 11/30/16 0607 11/30/16 1141  GLUCAP 100* 116* 103* 91 77   Lipid Profile: No results for input(s): CHOL, HDL, LDLCALC, TRIG, CHOLHDL, LDLDIRECT in the last 72 hours. Thyroid Function Tests: No results for input(s): TSH, T4TOTAL, FREET4, T3FREE, THYROIDAB in the last 72 hours. Anemia Panel: No results for input(s): VITAMINB12, FOLATE, FERRITIN, TIBC, IRON, RETICCTPCT in the last 72 hours. Urine analysis:    Component Value Date/Time   COLORURINE AMBER (A) 08/09/2016 2204   APPEARANCEUR TURBID (A) 08/09/2016 2204   LABSPEC 1.014 08/09/2016 2204   PHURINE 6.0 08/09/2016 2204   GLUCOSEU NEGATIVE 08/09/2016 2204   HGBUR MODERATE (A) 08/09/2016 2204   BILIRUBINUR NEGATIVE 08/09/2016 2204   KETONESUR NEGATIVE 08/09/2016 2204   PROTEINUR 100 (A) 08/09/2016 2204   NITRITE NEGATIVE 08/09/2016 2204   LEUKOCYTESUR LARGE (A) 08/09/2016 2204   Sepsis  Labs: @LABRCNTIP (procalcitonin:4,lacticidven:4) )No results found for this or any previous visit (from the past 240 hour(s)).       Radiology Studies: Dg Foot 2 Views Right  Result Date: 11/29/2016 CLINICAL DATA:  Osteomyelitis.  No additional history provided. EXAM: RIGHT FOOT - 2 VIEW COMPARISON:  Calcaneal radiographs 01/21/2016 FINDINGS: Post prior resection of the fourth toe at the proximal phalanx. Resection margins appear smooth. Advanced chronic change about the midfoot with loss of normal alignment, multifocal cystic change in some proliferation. No frank bony destruction. Mild smooth periosteal thickening of  the second through fourth metatarsals has a chronic appearance. Previous skin defect about the heel is less prevalent radiographically. Adjacent linear density in the heel in the region of previous skin defect is unchanged, has similar density to adjacent vascular calcifications. Diffuse bony under mineralization. There are dense vascular calcifications. No radiopaque foreign body. IMPRESSION: 1. No radiographic findings to suggest acute osteomyelitis. 2. Chronic change about the midfoot which may be a osteoarthritis or Charcot foot. No prior exams available for comparison. Electronically Signed   By: Rubye OaksMelanie  Ehinger M.D.   On: 11/29/2016 19:28      Scheduled Meds: . acetaminophen  650 mg Oral QID  . calcium acetate  1,334 mg Oral TID WC  . collagenase   Topical Daily  . diltiazem  120 mg Oral QHS  . famotidine  20 mg Oral QHS  . gabapentin  100 mg Oral Once per day on Sun Tue Thu Sat  . gabapentin  300 mg Oral Once per day on Mon Wed Fri  . insulin aspart  0-9 Units Subcutaneous TID WC  . lidocaine  1 patch Transdermal Q24H  . multivitamin  1 tablet Oral Daily  . omega-3 acid ethyl esters  1,000 mg Oral Daily  . PARoxetine  40 mg Oral QHS  . pravastatin  20 mg Oral QPM  . senna-docusate  2 tablet Oral BID   Continuous Infusions:   LOS: 2 days    Time spent in  minutes: 35    Calvert CantorSaima Shada Nienaber, MD [facility] Pager: [email or web address] Password [email or web address] 11/30/2016, 3:32 PM

## 2016-11-30 NOTE — Procedures (Signed)
I was present at this dialysis session. I have reviewed the session itself and made appropriate changes.   Filed Weights   11/28/16 0100 11/30/16 0644  Weight: 66.7 kg (147 lb) 66.8 kg (147 lb 4.3 oz)     Recent Labs Lab 11/30/16 0304  NA 127*  K 3.8  CL 94*  CO2 21*  GLUCOSE 108*  BUN 35*  CREATININE 5.01*  CALCIUM 8.1*     Recent Labs Lab 11/27/16 1617  WBC 7.5  HGB 12.3  HCT 36.8  MCV 92.2  PLT 282    Scheduled Meds: . acetaminophen  650 mg Oral QID  . calcium acetate  1,334 mg Oral TID WC  . diltiazem  120 mg Oral QHS  . famotidine  20 mg Oral QHS  . gabapentin  100 mg Oral Once per day on Sun Tue Thu Sat  . gabapentin  300 mg Oral Once per day on Mon Wed Fri  . insulin aspart  0-9 Units Subcutaneous TID WC  . lidocaine  1 patch Transdermal Q24H  . multivitamin  1 tablet Oral Daily  . omega-3 acid ethyl esters  1,000 mg Oral Daily  . PARoxetine  40 mg Oral QHS  . pravastatin  20 mg Oral QPM  . senna-docusate  2 tablet Oral BID   Continuous Infusions: PRN Meds:.albuterol, cyclobenzaprine, lidocaine-prilocaine, oxyCODONE, polyethylene glycol, traMADol    Dialysis Access:  RUA AVF  Pos. Bruit   [facility]: [facility]  [place]   [place]  [place]  16XW67kg HD Bath 2k, 2.5 ca   Time  3hrs  Heparin 2000 bolus  with infusion hourly 400   . Access RUA AVF  15 g  BFR 450 DFR 800     Epogen 2200   Units IV/HD  Venofer  50mg  weekly  Hd / NO vit d listed   Assessment/Plan: 1. Osteomyelitis/diskitis of L2-3, awaiting culture results, ID following 2. ESRD- [facility] 3. HTN/volume- stable 4. Chronic A fib- on coumadin 5. DM- per primary 6. Anemia of chronic kidney disease- ESA on hold due to Hgb >12. 7. Disposition- per primary. Irena CordsJoseph A Nilza Eaker,  MD 11/30/2016, 8:21 AM

## 2016-11-30 NOTE — Consult Note (Signed)
WOC Nurse wound consult note Reason for Consult: Consult requested for several locations.  Pt states all have been present "for awhile" and she is [place]. Wound type: Right great toe with full thickness wound; 1.2X1.2cm dry callous on the tip; no odor, drainage, or fluctuance. Right posterior achilles with a previous full thickness wound which has healed; no open wound, drainage, or fluctuance. Dry scar tissue is located in a deep valley. Upper buttocks with .2X.2cm dry dark red scabbed area; no open wound, drainage, or fluctuance. Pressure Injury POA: These were all present on admission and are NOT pressure injuries Dressing procedure/placement/frequency: Santyl to right toe to provide enzymatic debridement of nonviable tissue.  Foam dressing to protect right achilies and upper buttock from further injury. Pt can resume follow-up with the outpatient wound care center after discharge. Discussed plan of care and she verbalized understanding. Please re-consult if further assistance is needed.  Thank-you,  Cammie Mcgeeawn Bassheva Flury MSN, RN, CWOCN, DaleWCN-AP, CNS [phone]

## 2016-11-30 NOTE — Progress Notes (Signed)
Pt. Assisted to chair with 2 person assist. Pt. Initially refused to wear any brace. Pt.'s dtr convinced her to wear the corsett as she adamantly refuses the TLSO. Pt. Sat in chair <5 minutes as she began screaming and crying that she hurt too much. Assisted back to bed and given Tramadol and Flexeril. Pt. Refused dinner at this time.

## 2016-11-30 NOTE — Progress Notes (Addendum)
Initial Nutrition Assessment  DOCUMENTATION CODES:   Not applicable  INTERVENTION:    Ensure Enlive po BID, each supplement provides 350 kcal and 20 grams of protein  Patient needs assistance with feeding, RN aware  NUTRITION DIAGNOSIS:   Increased nutrient needs related to chronic illness as evidenced by estimated needs.  GOAL:   Patient will meet greater than or equal to 90% of their needs  MONITOR:   PO intake, Supplement acceptance  REASON FOR ASSESSMENT:   Consult Wound healing  ASSESSMENT:   83 yo female with PMH of ESRD-on HD, DM, HTN, pleural effusion, PVD, GERD, anxiety, neuropathy, stroke who was admitted on 6/30 with back pain (recent diskitis / osteomyelitis). CT revealed worsening L2-L3 discitis / osteomyelitis.   Patient reports difficulty feeding herself. Daughter has notified RN that she needs assistance with feeding. Patient has been consuming 10-50% of meals, because she cannot feed herself. She typically has a good appetite and eats well. She drinks a Boost supplement if she feels like she has not had enough to eat that day. She likes vanilla, chocolate, and strawberry flavors.  Nutrition-Focused physical exam completed. Findings are no fat depletion, mild-moderate and severe muscle depletion, and no edema. She has a lot of loose skin on her arms due to weight loss. She weighed 240 lbs in December 2014. She has had 5% loss of her usual weight in the past 6 months.  Labs reviewed: sodium 127 (L), prealbumin 12 (L), potassium 3.8 (WNL) Medications reviewed and include Phoslo and renal MVI.  Diet Order:  Diet renal/carb modified with fluid restriction Diet-HS Snack? Nothing; Room service appropriate? Yes; Fluid consistency: Thin Diet NPO time specified Except for: Sips with Meds  Skin:   (non pressure wd to R ankle & big toe; scabbed area buttocks)  Last BM:  6/29  Height:   Ht Readings from Last 1 Encounters:  11/28/16 5\' 5"  (1.651 m)    Weight:    Wt Readings from Last 1 Encounters:  11/30/16 144 lb 2.9 oz (65.4 kg)    Ideal Body Weight:  56.8 kg  BMI:  Body mass index is 23.99 kg/m.  Estimated Nutritional Needs:   Kcal:  1800-2000  Protein:  80-95 gm  Fluid:  1.2 L  EDUCATION NEEDS:   No education needs identified at this time  Joaquin CourtsKimberly Janissa Bertram, RD, LDN, CNSC Pager [phone] After Hours Pager [phone]

## 2016-11-30 NOTE — Progress Notes (Signed)
Patient ID: [name], female   DOB: [birth year], 83 y.o.   MRN: [id number]   Pt has been seen and consented for disc aspiration  In IR  INR 1.68 still today Now in dialysis  Plan for recheck INR in am If 1.5 or lower--plan to move ahead with aspiration 7/3  See new orders

## 2016-11-30 NOTE — Progress Notes (Signed)
Patient ID: Natalie RimaJoann Y [name], female   DOB: Jul 31, 1933, 83 y.o.   MRN: [id number]          [facility]    Date of Admission:  11/28/2016     [name] has smoldering lumbar discitis at the L2-3 level. An aspirate done on 08/14/2016 shows gram-positive cocci in pairs on Gram stain. All cultures were negative. She was treated with IV vancomycin and switched over to oral doxycycline. She was recently placed back on vancomycin but had worsening pain. She is now off all antibiotics pending a repeat aspirate. I will follow with you.         Cliffton AstersJohn Stanisha Lorenz, MD [facility] [phone] pager   [phone] cell 11/30/2016, 12:31 PM

## 2016-12-01 ENCOUNTER — Encounter (HOSPITAL_COMMUNITY): Payer: Medicare Other

## 2016-12-01 ENCOUNTER — Inpatient Hospital Stay (HOSPITAL_COMMUNITY): Payer: Medicare Other

## 2016-12-01 DIAGNOSIS — Z888 Allergy status to other drugs, medicaments and biological substances status: Secondary | ICD-10-CM

## 2016-12-01 DIAGNOSIS — Z885 Allergy status to narcotic agent status: Secondary | ICD-10-CM

## 2016-12-01 DIAGNOSIS — Z9889 Other specified postprocedural states: Secondary | ICD-10-CM

## 2016-12-01 DIAGNOSIS — M4626 Osteomyelitis of vertebra, lumbar region: Principal | ICD-10-CM

## 2016-12-01 DIAGNOSIS — Z992 Dependence on renal dialysis: Secondary | ICD-10-CM

## 2016-12-01 DIAGNOSIS — Z91048 Other nonmedicinal substance allergy status: Secondary | ICD-10-CM

## 2016-12-01 DIAGNOSIS — Z794 Long term (current) use of insulin: Secondary | ICD-10-CM

## 2016-12-01 DIAGNOSIS — Z79899 Other long term (current) drug therapy: Secondary | ICD-10-CM

## 2016-12-01 DIAGNOSIS — Z91041 Radiographic dye allergy status: Secondary | ICD-10-CM

## 2016-12-01 DIAGNOSIS — Z881 Allergy status to other antibiotic agents status: Secondary | ICD-10-CM

## 2016-12-01 HISTORY — PX: IR FLUORO GUIDED NEEDLE PLC ASPIRATION/INJECTION LOC: IMG2395

## 2016-12-01 LAB — HEMOGLOBIN A1C
Hgb A1c MFr Bld: 5.5 % (ref 4.8–5.6)
Mean Plasma Glucose: 111 mg/dL

## 2016-12-01 LAB — BASIC METABOLIC PANEL
ANION GAP: 11 (ref 5–15)
BUN: 17 mg/dL (ref 6–20)
CALCIUM: 8.3 mg/dL — AB (ref 8.9–10.3)
CHLORIDE: 98 mmol/L — AB (ref 101–111)
CO2: 24 mmol/L (ref 22–32)
Creatinine, Ser: 3.64 mg/dL — ABNORMAL HIGH (ref 0.44–1.00)
GFR calc non Af Amer: 11 mL/min — ABNORMAL LOW (ref >60)
GFR, EST AFRICAN AMERICAN: 12 mL/min — AB (ref >60)
Glucose, Bld: 110 mg/dL — ABNORMAL HIGH (ref 65–99)
Potassium: 3.7 mmol/L (ref 3.5–5.1)
Sodium: 133 mmol/L — ABNORMAL LOW (ref 135–145)

## 2016-12-01 LAB — GLUCOSE, CAPILLARY
GLUCOSE-CAPILLARY: 93 mg/dL (ref 65–99)
GLUCOSE-CAPILLARY: 140 mg/dL — AB (ref 65–99)
Glucose-Capillary: 88 mg/dL (ref 65–99)
Glucose-Capillary: 207 mg/dL — ABNORMAL HIGH (ref 65–99)

## 2016-12-01 LAB — PROTIME-INR
INR: 1.31
Prothrombin Time: 16.3 seconds — ABNORMAL HIGH (ref 11.4–15.2)

## 2016-12-01 MED ORDER — BUPIVACAINE HCL (PF) 0.25 % IJ SOLN
INTRAMUSCULAR | Status: AC
  Administered 2016-12-01: 10:00:00
  Filled 2016-12-01: qty 30

## 2016-12-01 MED ORDER — MIDAZOLAM HCL 2 MG/2ML IJ SOLN
INTRAMUSCULAR | Status: AC | PRN
  Administered 2016-12-01 (×2): 0.5 mg via INTRAVENOUS
  Administered 2016-12-01: 1 mg via INTRAVENOUS

## 2016-12-01 MED ORDER — SODIUM CHLORIDE 0.9 % IV SOLN
INTRAVENOUS | Status: AC
  Administered 2016-12-01: 14:00:00 via INTRAVENOUS

## 2016-12-01 MED ORDER — BUPIVACAINE HCL (PF) 0.5 % IJ SOLN
INTRAMUSCULAR | Status: AC | PRN
  Administered 2016-12-01: 15 mL

## 2016-12-01 MED ORDER — WARFARIN - PHARMACIST DOSING INPATIENT
Freq: Every day | Status: DC
  Administered 2016-12-01 – 2016-12-08 (×5)
  Administered 2016-12-09: 1

## 2016-12-01 MED ORDER — SODIUM CHLORIDE 0.9 % IV SOLN
8.0000 mg/kg | INTRAVENOUS | Status: DC
  Filled 2016-12-01 (×2): qty 10.46

## 2016-12-01 MED ORDER — SODIUM CHLORIDE 0.9 % IV SOLN
500.0000 mg | INTRAVENOUS | Status: DC
  Filled 2016-12-01: qty 10

## 2016-12-01 MED ORDER — MIDAZOLAM HCL 2 MG/2ML IJ SOLN
INTRAMUSCULAR | Status: AC
  Administered 2016-12-01: 09:00:00
  Filled 2016-12-01: qty 4

## 2016-12-01 MED ORDER — HEPARIN (PORCINE) IN NACL 100-0.45 UNIT/ML-% IJ SOLN
1100.0000 [IU]/h | INTRAMUSCULAR | Status: DC
  Administered 2016-12-01: 950 [IU]/h via INTRAVENOUS
  Administered 2016-12-02: 1100 [IU]/h via INTRAVENOUS
  Filled 2016-12-01 (×3): qty 250

## 2016-12-01 MED ORDER — FENTANYL CITRATE (PF) 100 MCG/2ML IJ SOLN
INTRAMUSCULAR | Status: AC | PRN
  Administered 2016-12-01 (×2): 25 ug via INTRAVENOUS

## 2016-12-01 MED ORDER — SODIUM CHLORIDE 0.9 % IV SOLN
INTRAVENOUS | Status: AC | PRN
  Administered 2016-12-01: 10 mL/h via INTRAVENOUS

## 2016-12-01 MED ORDER — WARFARIN SODIUM 5 MG PO TABS
5.0000 mg | ORAL_TABLET | Freq: Once | ORAL | Status: AC
  Administered 2016-12-01: 5 mg via ORAL
  Filled 2016-12-01: qty 1

## 2016-12-01 MED ORDER — SODIUM CHLORIDE 0.9 % IV SOLN
500.0000 mg | INTRAVENOUS | Status: DC
  Administered 2016-12-01: 500 mg via INTRAVENOUS
  Filled 2016-12-01 (×2): qty 10

## 2016-12-01 MED ORDER — FENTANYL CITRATE (PF) 100 MCG/2ML IJ SOLN
INTRAMUSCULAR | Status: AC
  Administered 2016-12-01: 09:00:00
  Filled 2016-12-01: qty 4

## 2016-12-01 NOTE — Sedation Documentation (Signed)
Prep underway 

## 2016-12-01 NOTE — Sedation Documentation (Addendum)
MOved to IR 2, mopved to table, positioned prone, tolerating well.

## 2016-12-01 NOTE — Progress Notes (Signed)
Patient ID: Helane RimaJoann Y Haberer, female   DOB: 1934/05/05, 83 y.o.   MRN: [id number] S: No new complaints O:BP (!) 143/90 (BP Location: Left Arm)   Pulse 92   Temp 98.1 F (36.7 C) (Oral)   Resp 15   Ht 5\' 5"  (1.651 m)   Wt 65.4 kg (144 lb 2.9 oz)   SpO2 98%   BMI 23.99 kg/m   Intake/Output Summary (Last 24 hours) at 12/01/16 1220 Last data filed at 11/30/16 2151  Gross per 24 hour  Intake              240 ml  Output                0 ml  Net              240 ml   Intake/Output: I/O last 3 completed shifts: In: 240 [P.O.:240] Out: 1000 [Other:1000]  Intake/Output this shift:  No intake/output data recorded. Weight change: -1.4 kg (-3 lb 1.4 oz) WUJ:WJXBJGen:frail, elderly wf in nad CVS:no rub Resp:cta YNW:GNFAOZAbd:benign Ext:no edema, RAVF +T/B   Recent Labs Lab 11/27/16 1617 11/28/16 0814 11/29/16 0213 11/30/16 0304 12/01/16 0321  NA 131* 130* 128* 127* 133*  K 2.9* 3.5 3.8 3.8 3.7  CL 94* 97* 96* 94* 98*  CO2 27 23 20* 21* 24  GLUCOSE 91 87 99 108* 110*  BUN 13 16 24* 35* 17  CREATININE 2.26* 3.16* 3.94* 5.01* 3.64*  ALBUMIN 3.2*  --   --   --   --   CALCIUM 9.0 8.4* 8.2* 8.1* 8.3*  AST 27  --   --   --   --   ALT 16  --   --   --   --    Liver Function Tests:  Recent Labs Lab 11/27/16 1617  AST 27  ALT 16  ALKPHOS 93  BILITOT 0.9  PROT 7.3  ALBUMIN 3.2*    Recent Labs Lab 11/27/16 1617  LIPASE 18   No results for input(s): AMMONIA in the last 168 hours. CBC:  Recent Labs Lab 11/27/16 1617  WBC 7.5  HGB 12.3  HCT 36.8  MCV 92.2  PLT 282   Cardiac Enzymes: No results for input(s): CKTOTAL, CKMB, CKMBINDEX, TROPONINI in the last 168 hours. CBG:  Recent Labs Lab 11/30/16 1141 11/30/16 1702 11/30/16 2104 12/01/16 0615 12/01/16 1122  GLUCAP 77 91 105* 88 93    Iron Studies: No results for input(s): IRON, TIBC, TRANSFERRIN, FERRITIN in the last 72 hours. Studies/Results: Dg Foot 2 Views Right  Result Date: 11/29/2016 CLINICAL DATA:   Osteomyelitis.  No additional history provided. EXAM: RIGHT FOOT - 2 VIEW COMPARISON:  Calcaneal radiographs 01/21/2016 FINDINGS: Post prior resection of the fourth toe at the proximal phalanx. Resection margins appear smooth. Advanced chronic change about the midfoot with loss of normal alignment, multifocal cystic change in some proliferation. No frank bony destruction. Mild smooth periosteal thickening of the second through fourth metatarsals has a chronic appearance. Previous skin defect about the heel is less prevalent radiographically. Adjacent linear density in the heel in the region of previous skin defect is unchanged, has similar density to adjacent vascular calcifications. Diffuse bony under mineralization. There are dense vascular calcifications. No radiopaque foreign body. IMPRESSION: 1. No radiographic findings to suggest acute osteomyelitis. 2. Chronic change about the midfoot which may be a osteoarthritis or Charcot foot. No prior exams available for comparison. Electronically Signed   By: Lujean RaveMelanie  Ehinger M.D.  On: 11/29/2016 19:28   . acetaminophen  650 mg Oral QID  . bupivacaine (PF)      . calcium acetate  1,334 mg Oral TID WC  . collagenase   Topical Daily  . diltiazem  120 mg Oral QHS  . famotidine  20 mg Oral QHS  . feeding supplement (ENSURE ENLIVE)  237 mL Oral BID BM  . fentaNYL      . gabapentin  100 mg Oral Once per day on Sun Tue Thu Sat  . gabapentin  300 mg Oral Once per day on Mon Wed Fri  . insulin aspart  0-9 Units Subcutaneous TID WC  . lidocaine  1 patch Transdermal Q24H  . midazolam      . multivitamin  1 tablet Oral Daily  . omega-3 acid ethyl esters  1,000 mg Oral Daily  . PARoxetine  40 mg Oral QHS  . pravastatin  20 mg Oral QPM  . senna-docusate  2 tablet Oral BID    BMET    Component Value Date/Time   NA 133 (L) 12/01/2016 0321   NA 137 07/04/2014 1303   K 3.7 12/01/2016 0321   K 3.3 (L) 07/04/2014 1303   CL 98 (L) 12/01/2016 0321   CL 100  07/04/2014 1303   CO2 24 12/01/2016 0321   CO2 29 07/04/2014 1303   GLUCOSE 110 (H) 12/01/2016 0321   GLUCOSE 76 07/04/2014 1303   BUN 17 12/01/2016 0321   BUN 44 (H) 07/04/2014 1303   CREATININE 3.64 (H) 12/01/2016 0321   CREATININE 2.72 (H) 07/04/2014 1303   CALCIUM 8.3 (L) 12/01/2016 0321   CALCIUM 8.9 07/04/2014 1303   GFRNONAA 11 (L) 12/01/2016 0321   GFRNONAA 18 (L) 07/04/2014 1303   GFRNONAA 29 (L) 12/02/2013 0847   GFRAA 12 (L) 12/01/2016 0321   GFRAA 22 (L) 07/04/2014 1303   GFRAA 34 (L) 12/02/2013 0847   CBC    Component Value Date/Time   WBC 7.5 11/27/2016 1617   RBC 3.99 11/27/2016 1617   HGB 12.3 11/27/2016 1617   HGB 14.2 12/03/2013 0820   HCT 36.8 11/27/2016 1617   HCT 42.7 12/03/2013 0820   PLT 282 11/27/2016 1617   PLT 214 12/03/2013 0820   MCV 92.2 11/27/2016 1617   MCV 94 12/03/2013 0820   MCH 31.0 11/27/2016 1617   MCHC 33.6 11/27/2016 1617   RDW 18.3 (H) 11/27/2016 1617   RDW 17.1 (H) 12/03/2013 0820   LYMPHSABS 0.8 (L) 08/09/2016 2011   LYMPHSABS 1.1 12/03/2013 0820   MONOABS 0.7 08/09/2016 2011   MONOABS 0.9 12/03/2013 0820   EOSABS 0.2 08/09/2016 2011   EOSABS 0.2 12/03/2013 0820   BASOSABS 0.1 08/09/2016 2011   BASOSABS 0.1 12/03/2013 0820    Dialysis Access: RUA AVF Pos. Bruit   Dialysis Orders: Center: [facility] [place] on [place] . EDW 67kgHD Bath 2k, 2.5 ca Time 3hrs Heparin 2000 bolus with infusion hourly 400 . Access RUA AVF 15 g BFR 450DFR 800 Epogen 2200Units IV/HD Venofer 50mg  weekly Hd / NO vit d listed   Assessment/Plan: 1. Osteomyelitis/diskitis of L2-3, awaiting culture results, ID following and recommending IV daptomycin 2. ESRD- continue on MWF schedule (outpatient at [facility]) 3. HTN/volume- stable 4. Chronic A fib- on coumadin 5. DM- per primary 6. Anemia of chronic kidney disease- ESA on hold due to Hgb >12. 7. Disposition- per primary.  Irena Cords,  MD [employer] [phone]

## 2016-12-01 NOTE — Sedation Documentation (Signed)
O2 d/c'd 

## 2016-12-01 NOTE — Progress Notes (Addendum)
Pharmacy Antibiotic Note  [name] is a 83 y.o. female admitted on 11/28/2016 with history of osteomyelitis/diskitis s/p IR guided aspiration.   Pharmacy has been consulted for daptomycin dosing. Patient is ESRD on HD MWF, last 7/2 was full session. Gram stain from IR aspirate is pending. Patient had 6 weeks of vancomycin; however failed therapy. Also with calcaneal osteo of right heel. Patient is currently afebrile with normal white count. No CK.   Plan: Daptomycin 8mg /kg q48h Baseline + weekly CK Monitor HD plans and tolerance F/u C/S, LOT   Height: 5\' 5"  (165.1 cm) Weight: 144 lb 2.9 oz (65.4 kg) IBW/kg (Calculated) : 57  Temp (24hrs), Avg:98.2 F (36.8 C), Min:97.9 F (36.6 C), Max:98.6 F (37 C)   Recent Labs Lab 11/27/16 1617 11/28/16 0814 11/29/16 0213 11/30/16 0304 12/01/16 0321  WBC 7.5  --   --   --   --   CREATININE 2.26* 3.16* 3.94* 5.01* 3.64*    Estimated Creatinine Clearance: 10.9 mL/min (A) (by C-G formula based on SCr of 3.64 mg/dL (H)).    Allergies  Allergen Reactions  . Ceftazidime Other (See Comments)    myoclonus  . Codeine Itching and Other (See Comments)    Reaction:  Confusion/Hallucinations   . Contrast Media [Iodinated Diagnostic Agents] Other (See Comments)    "skin peel" breaks into a blistered rash, then peel. If given Benadryl and solumedrol she will be okay  . Oxycodone Other (See Comments)    Reaction:  Confusion/Hallucinations   . Quinine Derivatives Rash  . Tape Other (See Comments)    Skin is very thin and will blister and pull off skin with any tape except paper tape.  PLEASE USE PAPER TAPE.    Antimicrobials this admission: None this admission  Dose adjustments this admission:  Microbiology results: 7/3 Body Fluid: Pending  Thank you for allowing pharmacy to be a part of this patient's care.  Alfredo BachJoseph Laramie Gelles, Cleotis NipperBS, PharmD Clinical Pharmacy Resident [phone] (Pager) 12/01/2016 12:14 PM

## 2016-12-01 NOTE — Progress Notes (Signed)
[facility]  Date of Admission:  11/28/2016     ASSESSMENT: Her original lumbar aspirate in March showed gram-positive cocci in pairs but the culture was negative. She has had persistent pain despite 2 courses of IV vancomycin separated by oral doxycycline. The Gram stain of today's aspirate is still pending. I will start her on IV daptomycin.  PLAN: 1. Start IV daptomycin pending lumbar aspirate Gram stain and cultures  Principal Problem:   Osteomyelitis of lumbar spine (HCC) Active Problems:   ESRD on dialysis (HCC)   Diabetes mellitus (HCC)   Chronic atrial fibrillation (HCC)   Acute osteomyelitis of right calcaneus (HCC)   Diabetic ulcer of right heel associated with diabetes mellitus due to underlying condition, with necrosis of bone (HCC)   . acetaminophen  650 mg Oral QID  . bupivacaine (PF)      . calcium acetate  1,334 mg Oral TID WC  . collagenase   Topical Daily  . diltiazem  120 mg Oral QHS  . famotidine  20 mg Oral QHS  . feeding supplement (ENSURE ENLIVE)  237 mL Oral BID BM  . fentaNYL      . gabapentin  100 mg Oral Once per day on Sun Tue Thu Sat  . gabapentin  300 mg Oral Once per day on Mon Wed Fri  . insulin aspart  0-9 Units Subcutaneous TID WC  . lidocaine  1 patch Transdermal Q24H  . midazolam      . multivitamin  1 tablet Oral Daily  . omega-3 acid ethyl esters  1,000 mg Oral Daily  . PARoxetine  40 mg Oral QHS  . pravastatin  20 mg Oral QPM  . senna-docusate  2 tablet Oral BID    SUBJECTIVE: She does not recall her severe back pain improving when she was on vancomycin previously.  Review of Systems: Review of Systems  Constitutional: Negative for chills, diaphoresis and fever.  Respiratory: Negative for cough.   Cardiovascular: Negative for chest pain.  Gastrointestinal: Negative for abdominal pain, diarrhea, nausea and vomiting.  Musculoskeletal: Positive for back pain.    Allergies  Allergen  Reactions  . Ceftazidime Other (See Comments)    myoclonus  . Codeine Itching and Other (See Comments)    Reaction:  Confusion/Hallucinations   . Contrast Media [Iodinated Diagnostic Agents] Other (See Comments)    "skin peel" breaks into a blistered rash, then peel. If given Benadryl and solumedrol she will be okay  . Oxycodone Other (See Comments)    Reaction:  Confusion/Hallucinations   . Quinine Derivatives Rash  . Tape Other (See Comments)    Skin is very thin and will blister and pull off skin with any tape except paper tape.  PLEASE USE PAPER TAPE.    OBJECTIVE: Vitals:   12/01/16 1000 12/01/16 1015 12/01/16 1030 12/01/16 1100  BP: (!) 151/58 (!) 151/64 (!) 153/61 (!) 143/90  Pulse: 98 92 94 92  Resp:      Temp:      TempSrc:      SpO2: 99% 97% 98% 98%  Weight:      Height:       Body mass index is 23.99 kg/m.  Physical Exam  Constitutional: She is oriented to person, place, and time.  She is drowsy but arouses and answers questions appropriately. She is just back from radiology where she underwent lumbar aspirate.  Cardiovascular: Normal rate and regular rhythm.   No  murmur heard. Pulmonary/Chest: Effort normal and breath sounds normal.  Abdominal: Soft. There is no tenderness.  Musculoskeletal:  Right upper arm AV fistula appears normal.  Neurological: She is alert and oriented to person, place, and time.  Skin: No rash noted.  Psychiatric: Mood and affect normal.    Lab Results Lab Results  Component Value Date   WBC 7.5 11/27/2016   HGB 12.3 11/27/2016   HCT 36.8 11/27/2016   MCV 92.2 11/27/2016   PLT 282 11/27/2016    Lab Results  Component Value Date   CREATININE 3.64 (H) 12/01/2016   BUN 17 12/01/2016   NA 133 (L) 12/01/2016   K 3.7 12/01/2016   CL 98 (L) 12/01/2016   CO2 24 12/01/2016    Lab Results  Component Value Date   ALT 16 11/27/2016   AST 27 11/27/2016   ALKPHOS 93 11/27/2016   BILITOT 0.9 11/27/2016     Microbiology: Recent  Results (from the past 240 hour(s))  Body fluid culture     Status: None (Preliminary result)   Collection Time: 12/01/16  8:50 AM  Result Value Ref Range Status   Specimen Description FLUID  Final   Special Requests L2 3 DISC ASPIRATE SENT 2 SYRINGES  Final   Gram Stain PENDING  Incomplete   Culture PENDING  Incomplete   Report Status PENDING  Incomplete    Cliffton AstersJohn Emmalynn Pinkham, MD [facility] [phone] pager   [phone] 12/01/2016, 11:51 AM

## 2016-12-01 NOTE — Sedation Documentation (Signed)
O2 2l/[place]  placed 

## 2016-12-01 NOTE — Progress Notes (Signed)
Pt is going off the unit to Interventional Radiology for her lumbar spine aspiration. I spoke with the daughter Standley Brookingam Clemmons to inform her that they were taking her now for the procedure. Will assess patient when she returns.

## 2016-12-01 NOTE — Progress Notes (Addendum)
PROGRESS NOTE    Natalie DELAINE   [name]:[id number]  DOB: [birth year]  DOA: 11/28/2016 PCP: [name], MD   Brief Narrative:  [name] is a 83 y.o. female with medical history significant of ESRD dialysis on MWF, A.Fib on coumadin, HTN.  Patient is being treated for diskitis / osteomyelitis of L2-L3 with Vancomycin with dialysis. Aspirate in March demonstrated GPC in pairs on GS but cultures without growth. Despite treatment, her back pain has worsened and she presented to the ED at [facility]    CT reveals worsening L2-L3 discitis/ osteomyelitis with severe canal stenosis.  She was seen by ID on 6/29. They recommended to hold all antibiotics for a repeat aspiration of disc space and culture as soon as possible.   Subjective: Evaluated after IR procedure. She has eaten lunch. She states that her back is not hurting at this time.   ROS:  No nausea, vomiting, chest pain, dyspnea, cough, constipation or diarrhea. No other complaints.   Assessment & Plan:   Principal Problem:   Discitis and Osteomyelitis of lumbar spine  - worsening L2-L3 discitis/ osteomyelitis with severe canal stenosis on CT on 6/29 - appreciate ID eval- holding antibiotics for now- previously on Vanc - IR has aspirated today- Daptomycin recommended by ID - needs to wear back brace which she has been resistant to wear- RN has contacted daughter to bring it into the hospital so she does not get charged for a new one- Pt eval ordered-  Had pain when getting up yesterday to the chair and as expected did not want to wear the brace-  - Tramadol, Oxycodone, Neurontin and Flexaril ordered  Active Problems:     Chronic atrial fibrillation  - CHA2DS2-VASc Score at least 6 - Coumadin held for aspiration of disc space- will place on Heparin until INR therapeutic again- high risk for CVA - cont Cardizem and telemetry monitoring- currently in NSR  Supratherapeutic INR - INR 3.45 >> 2.79>> 1.68>> 1.31 -  given Vit K 2 mg  given on 6/30    ESRD on dialysis   - MWF dialysis - appreciate nephrology follow up  Early cirrhosis - noted on CT 6/29  Right middle lobe pulm nodules - stable from last imaging 13 mo ago- f/u Ct in 1 yr  dCHF- chronic - on lasix 80 BID as outpt - does not make urine and therefore discontinued -  mod conc hypertrophy on last ECHO 1/17    Diabetes mellitus 2 - low dose SSI- Levemir on hold  Hypokalemia - resolved  Hyponatremia - correct with dialysis / per nephro  Right sciatic nerve stimulator   DVT prophylaxis: SCDs- INR still therapeutic Code Status: DNR Family Communication:  Disposition Plan:  Consultants:   ID  IR Procedures:     Antimicrobials:  Anti-infectives    Start     Dose/Rate Route Frequency Ordered Stop   12/03/16 1500  DAPTOmycin (CUBICIN) 500 mg in sodium chloride 0.9 % IVPB  Status:  Discontinued     500 mg 220 mL/hr over 30 Minutes Intravenous Every 48 hours 12/01/16 1559 12/01/16 1614   12/01/16 1700  DAPTOmycin (CUBICIN) 500 mg in sodium chloride 0.9 % IVPB     500 mg 220 mL/hr over 30 Minutes Intravenous Every 48 hours 12/01/16 1614     12/01/16 1500  DAPTOmycin (CUBICIN) 523 mg in sodium chloride 0.9 % IVPB  Status:  Discontinued     8 mg/kg  65.4 kg 220.9 mL/hr over 30  Minutes Intravenous Every 48 hours 12/01/16 1217 12/01/16 1559       Objective: Vitals:   12/01/16 1030 12/01/16 1100 12/01/16 1341 12/01/16 1701  BP: (!) 153/61 (!) 143/90 (!) 127/55 (!) 138/54  Pulse: 94 92 90 86  Resp:   18 19  Temp:   97.9 F (36.6 C) 98.4 F (36.9 C)  TempSrc:   Oral Oral  SpO2: 98% 98% 97% 96%  Weight:      Height:        Intake/Output Summary (Last 24 hours) at 12/01/16 1711 Last data filed at 11/30/16 2151  Gross per 24 hour  Intake              240 ml  Output                0 ml  Net              240 ml   Filed Weights   11/28/16 0100 11/30/16 0644 11/30/16 0951  Weight: 66.7 kg (147 lb) 66.8 kg (147 lb 4.3 oz) 65.4 kg  (144 lb 2.9 oz)    Examination: General exam: Appears comfortable today HEENT: PERRLA, oral mucosa moist, no sclera icterus or thrush Respiratory system: Clear to auscultation. Respiratory effort normal. Cardiovascular system: S1 & S2 heard, RRR.  No murmurs  Gastrointestinal system: Abdomen soft, non-tender, nondistended. Normal bowel sound. No organomegaly Central nervous system: Alert and oriented. No focal neurological deficits. Extremities: No cyanosis, clubbing or edema MSK- tenderness in lower back noted.  Skin: No rashes or ulcers Psychiatry:  Mood & affect appropriate.     Data Reviewed: I have personally reviewed following labs and imaging studies  CBC:  Recent Labs Lab 11/27/16 1617  WBC 7.5  HGB 12.3  HCT 36.8  MCV 92.2  PLT 282   Basic Metabolic Panel:  Recent Labs Lab 11/27/16 1617 11/28/16 0814 11/29/16 0213 11/30/16 0304 12/01/16 0321  NA 131* 130* 128* 127* 133*  K 2.9* 3.5 3.8 3.8 3.7  CL 94* 97* 96* 94* 98*  CO2 27 23 20* 21* 24  GLUCOSE 91 87 99 108* 110*  BUN 13 16 24* 35* 17  CREATININE 2.26* 3.16* 3.94* 5.01* 3.64*  CALCIUM 9.0 8.4* 8.2* 8.1* 8.3*   GFR: Estimated Creatinine Clearance: 10.9 mL/min (A) (by C-G formula based on SCr of 3.64 mg/dL (H)). Liver Function Tests:  Recent Labs Lab 11/27/16 1617  AST 27  ALT 16  ALKPHOS 93  BILITOT 0.9  PROT 7.3  ALBUMIN 3.2*    Recent Labs Lab 11/27/16 1617  LIPASE 18   No results for input(s): AMMONIA in the last 168 hours. Coagulation Profile:  Recent Labs Lab 11/28/16 0300 11/29/16 0213 11/30/16 0304 12/01/16 0321  INR 3.45 2.79 1.68 1.31   Cardiac Enzymes: No results for input(s): CKTOTAL, CKMB, CKMBINDEX, TROPONINI in the last 168 hours. BNP (last 3 results) No results for input(s): PROBNP in the last 8760 hours. HbA1C:  Recent Labs  11/30/16 0304  HGBA1C 5.5   CBG:  Recent Labs Lab 11/30/16 1702 11/30/16 2104 12/01/16 0615 12/01/16 1122 12/01/16 1634    GLUCAP 91 105* 88 93 207*   Lipid Profile: No results for input(s): CHOL, HDL, LDLCALC, TRIG, CHOLHDL, LDLDIRECT in the last 72 hours. Thyroid Function Tests: No results for input(s): TSH, T4TOTAL, FREET4, T3FREE, THYROIDAB in the last 72 hours. Anemia Panel: No results for input(s): VITAMINB12, FOLATE, FERRITIN, TIBC, IRON, RETICCTPCT in the last 72 hours. Urine analysis:  Component Value Date/Time   COLORURINE AMBER (A) 08/09/2016 2204   APPEARANCEUR TURBID (A) 08/09/2016 2204   LABSPEC 1.014 08/09/2016 2204   PHURINE 6.0 08/09/2016 2204   GLUCOSEU NEGATIVE 08/09/2016 2204   HGBUR MODERATE (A) 08/09/2016 2204   BILIRUBINUR NEGATIVE 08/09/2016 2204   KETONESUR NEGATIVE 08/09/2016 2204   PROTEINUR 100 (A) 08/09/2016 2204   NITRITE NEGATIVE 08/09/2016 2204   LEUKOCYTESUR LARGE (A) 08/09/2016 2204   Sepsis Labs: @LABRCNTIP (procalcitonin:4,lacticidven:4) ) Recent Results (from the past 240 hour(s))  Body fluid culture     Status: None (Preliminary result)   Collection Time: 12/01/16  8:50 AM  Result Value Ref Range Status   Specimen Description FLUID  Final   Special Requests L2 3 DISC ASPIRATE SENT 2 SYRINGES  Final   Gram Stain PENDING  Incomplete   Culture PENDING  Incomplete   Report Status PENDING  Incomplete         Radiology Studies: Dg Foot 2 Views Right  Result Date: 11/29/2016 CLINICAL DATA:  Osteomyelitis.  No additional history provided. EXAM: RIGHT FOOT - 2 VIEW COMPARISON:  Calcaneal radiographs 01/21/2016 FINDINGS: Post prior resection of the fourth toe at the proximal phalanx. Resection margins appear smooth. Advanced chronic change about the midfoot with loss of normal alignment, multifocal cystic change in some proliferation. No frank bony destruction. Mild smooth periosteal thickening of the second through fourth metatarsals has a chronic appearance. Previous skin defect about the heel is less prevalent radiographically. Adjacent linear density in the  heel in the region of previous skin defect is unchanged, has similar density to adjacent vascular calcifications. Diffuse bony under mineralization. There are dense vascular calcifications. No radiopaque foreign body. IMPRESSION: 1. No radiographic findings to suggest acute osteomyelitis. 2. Chronic change about the midfoot which may be a osteoarthritis or Charcot foot. No prior exams available for comparison. Electronically Signed   By: Rubye Oaks M.D.   On: 11/29/2016 19:28      Scheduled Meds: . acetaminophen  650 mg Oral QID  . calcium acetate  1,334 mg Oral TID WC  . collagenase   Topical Daily  . diltiazem  120 mg Oral QHS  . famotidine  20 mg Oral QHS  . feeding supplement (ENSURE ENLIVE)  237 mL Oral BID BM  . gabapentin  100 mg Oral Once per day on Sun Tue Thu Sat  . gabapentin  300 mg Oral Once per day on Mon Wed Fri  . insulin aspart  0-9 Units Subcutaneous TID WC  . lidocaine  1 patch Transdermal Q24H  . multivitamin  1 tablet Oral Daily  . omega-3 acid ethyl esters  1,000 mg Oral Daily  . PARoxetine  40 mg Oral QHS  . pravastatin  20 mg Oral QPM  . senna-docusate  2 tablet Oral BID   Continuous Infusions: . DAPTOmycin (CUBICIN)  IV 500 mg (12/01/16 1640)     LOS: 3 days    Time spent in minutes: 35    Calvert Cantor, MD [facility] Pager: [email or web address] Password [email or web address] 12/01/2016, 5:11 PM

## 2016-12-01 NOTE — Procedures (Signed)
S/P L2 -L3 fluoro guided disc aspiration. X 2 passes.

## 2016-12-01 NOTE — Clinical Social Work Note (Signed)
Clinical Social Work Assessment  Patient Details  Name: [name] MRN: [id number] Date of Birth: [birth year]  Date of referral:  12/01/16               Reason for consult:  Facility Placement, Discharge Planning                Permission sought to share information with:  Facility Medical sales representative, Family Supports Permission granted to share information::  Yes, Verbal Permission Granted  Name::     Engineer, drilling::  SNF  Relationship::  Daughter-POA  Contact Information:     Housing/Transportation Living arrangements for the past 2 months:  Skilled Building surveyor of Information:  Adult Children, Chief Technology Officer Patient Interpreter Needed:  None Criminal Activity/Legal Involvement Pertinent to Current Situation/Hospitalization:  No - Comment as needed Significant Relationships:  Adult Children Lives with:  Self, Facility Resident Do you feel safe going back to the place where you live?  Yes Need for family participation in patient care:  Yes (Comment) (Daughter is POA)  Care giving concerns:  Patient has been at [facility], and family is somewhat satisfied with the level of care received there. Patient's daughter indicated that she was more satisfied with care received at [facility] than any other SNF in [facility].   Social Worker assessment / plan:  CSW introduced self to patient's daughter, Elita Quick, over the phone. CSW explained role in discharge planning, and discussed plans with the patient's daughter. Patient's daughter discussed how the patient has been at [facility] most recently, but has been to all of the SNF facilities in [facility]. Patient's daughter indicated preference for [facility] over the others, but that she was not totally satisfied with any of the facilities. Patient's daughter indicated that she would be agreeable to return to SNF if needed, but that she feels that the patient may require a higher level of care. Patient's daughter questioned if  any of the medical team had begun discussions on palliative or goals of care at this point. Patient's daughter indicated that she was unsure of the best disposition plan at this time for the patient's medical needs. CSW indicated that she would follow along to determine disposition and help with discharge planning as needed.  Employment status:  Retired Health and safety inspector:  Medicare PT Recommendations:  Not assessed at this time Information / Referral to community resources:     Patient/Family's Response to care:  Patient's daughter agreeable to SNF placement, if needed.  Patient/Family's Understanding of and Emotional Response to Diagnosis, Current Treatment, and Prognosis:  Patient's daughter seems to understand the health concerns that the patient has at this time and the need for additional assistance. Patient's daughter expressed concern about not knowing when to begin the discussion on when to begin comfort care. Patient's daughter discussed the difference between life and quality of life, and the difficult decisions that will come up with that decision. Patient's daughter indicated understanding of CSW role in discharge planning.  Emotional Assessment Appearance:  Appears stated age Attitude/Demeanor/Rapport:  Unable to Assess Affect (typically observed):  Unable to Assess Orientation:  Oriented to Self Alcohol / Substance use:  Not Applicable Psych involvement (Current and /or in the community):  No (Comment)  Discharge Needs  Concerns to be addressed:  Care Coordination, Discharge Planning Concerns Readmission within the last 30 days:  Yes Current discharge risk:  Physical Impairment, Cognitively Impaired Barriers to Discharge:  Continued Medical Work up   [employer],  LCSW 12/01/2016, 5:08 PM

## 2016-12-01 NOTE — Sedation Documentation (Signed)
L AC IV occluded, IV attempted LFA without success, IV team consulted

## 2016-12-01 NOTE — Progress Notes (Signed)
PT Cancellation Note  Patient Details Name: [name] MRN: [id number] DOB: 10-Feb-1934   Cancelled Treatment:    Reason Eval/Treat Not Completed: Patient not medically ready (orders for bedrest at this time, will reattempt as able)   Fabio Asaevon J [name] 12/01/2016, 9:49 AM Charlotte Crumbevon [name], PT DPT [facility] [phone]

## 2016-12-01 NOTE — Progress Notes (Signed)
ANTICOAGULATION CONSULT NOTE - Initial Consult  Pharmacy Consult for heparin and warfarin Indication: atrial fibrillation  Allergies  Allergen Reactions  . Ceftazidime Other (See Comments)    myoclonus  . Codeine Itching and Other (See Comments)    Reaction:  Confusion/Hallucinations   . Contrast Media [Iodinated Diagnostic Agents] Other (See Comments)    "skin peel" breaks into a blistered rash, then peel. If given Benadryl and solumedrol she will be okay  . Oxycodone Other (See Comments)    Reaction:  Confusion/Hallucinations   . Quinine Derivatives Rash  . Tape Other (See Comments)    Skin is very thin and will blister and pull off skin with any tape except paper tape.  PLEASE USE PAPER TAPE.   Patient Measurements: Height: 5\' 5"  (165.1 cm) Weight: 144 lb 2.9 oz (65.4 kg) IBW/kg (Calculated) : 57 Heparin Dosing Weight: 65.4 Kg  Vital Signs: Temp: 98.4 F (36.9 C) (07/03 1701) Temp Source: Oral (07/03 1701) BP: 138/54 (07/03 1701) Pulse Rate: 86 (07/03 1701)  Labs:  Recent Labs  11/29/16 0213 11/30/16 0304 12/01/16 0321  LABPROT 30.0* 20.0* 16.3*  INR 2.79 1.68 1.31  CREATININE 3.94* 5.01* 3.64*   Estimated Creatinine Clearance: 10.9 mL/min (A) (by C-G formula based on SCr of 3.64 mg/dL (H)).  Medical History: Past Medical History:  Diagnosis Date  . Anemia   . Anxiety   . Arthritis    gout  . Atrial fibrillation (HCC)   . CHF (congestive heart failure) (HCC)    has had this off and on prior to dialysis but has had several times since  . Chronic kidney disease    end stage renal insufficiency.  on dialysis M,W,F  . Diabetes mellitus without complication (HCC)   . Dialysis patient [facility])    Mon.- Wed- Fri.  . Dysrhythmia    afib/flutter  . GERD (gastroesophageal reflux disease)   . Hypertension   . 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 Peripheral vascular disease (HCC)   . Pleural effusion   . Pulmonary hypertension (HCC)   . Renal  insufficiency   . Restless leg syndrome   . Shortness of breath dyspnea    with exertion  . Stroke (HCC) 06/2015   has had 1 other stroke. first one affected vision. the most recent one affected right arm   Medications:  Prescriptions Prior to Admission  Medication Sig Dispense Refill Last Dose  . acetaminophen (TYLENOL) 650 MG CR tablet Take 1,300 mg by mouth 2 (two) times daily.   11/27/2016 at 806  . albuterol (PROVENTIL HFA;VENTOLIN HFA) 108 (90 Base) MCG/ACT inhaler Inhale 1 puff into the lungs every 6 (six) hours as needed for wheezing or shortness of breath.    unknown  . Amino Acids-Protein Hydrolys (FEEDING SUPPLEMENT, PRO-STAT SUGAR FREE 64,) LIQD Take 30 mLs by mouth 3 (three) times daily with meals.   11/27/2016 at 1329  . B Complex-C-Folic Acid (RENAL-VITE) 0.8 MG TABS Take 0.8 mg by mouth daily.   11/27/2016 at 806  . Biotin 1 MG CAPS Take 1 mg by mouth daily.    11/27/2016 at 545  . calcium acetate (PHOSLO) 667 MG tablet Take 2,001 mg by mouth 3 (three) times daily with meals. With any food   11/27/2016 at 545  . cyclobenzaprine (FLEXERIL) 5 MG tablet Take 1 tablet (5 mg total) by mouth 3 (three) times daily as needed for muscle spasms. 10 tablet 0 11/26/2016 at 1715  . diltiazem (CARDIZEM  CD) 120 MG 24 hr capsule Take 120 mg by mouth daily at 6 PM.    11/26/2016 at 1715  . furosemide (LASIX) 80 MG tablet Take 80 mg by mouth 2 (two) times daily.   11/27/2016 at 545  . gabapentin (NEURONTIN) 100 MG capsule Take 100 mg by mouth See admin instructions. Take 1 capsule (100 mg) by mouth Sunday,Tuesday, Thursday, Saturday at bedtime   11/26/2016 at 2008  . gabapentin (NEURONTIN) 300 MG capsule Take 300 mg by mouth See admin instructions. Take 1 capsule (300 mg) by mouth Monday, Wednesday, Friday at bedtime   11/25/2016 at 2045  . insulin detemir (LEVEMIR) 100 UNIT/ML injection Inject 0.06 mLs (6 Units total) into the skin 2 (two) times daily. 10 mL 11 11/27/2016 at 545  . lidocaine (LIDODERM) 5 %  Place 1 patch onto the skin daily. Remove & Discard patch within 12 hours or as directed by MD 2 patch 0 11/27/2016 at 806  . lidocaine-prilocaine (EMLA) cream Apply 1 application topically See admin instructions. Apply to dialysis port on Monday, Wednesday and Friday at 10am (before dialysis)   11/27/2016 at 947  . Multiple Vitamin (MULTIVITAMIN WITH MINERALS) TABS tablet Take 1 tablet by mouth daily at 6 PM.   11/26/2016 at 1715  . Omega-3 Fatty Acids (FISH OIL) 1000 MG CAPS Take 1,000 mg by mouth 2 (two) times daily.   11/27/2016 at 545  . oxyCODONE (OXY IR/ROXICODONE) 5 MG immediate release tablet Take 1 tablet (5 mg total) by mouth every 4 (four) hours as needed for severe pain. 5 tablet 0 11/27/2016 at 751  . OXYGEN Inhale 2 L into the lungs continuous.   11/28/2016 at Unknown time  . PARoxetine (PAXIL) 40 MG tablet Take 40 mg by mouth at bedtime.    11/26/2016 at 2008  . polyethylene glycol (MIRALAX / GLYCOLAX) packet Take 17 g by mouth daily as needed (constipation). Mix in 4-8 oz liquid and drink   unknown  . pravastatin (PRAVACHOL) 20 MG tablet Take 20 mg by mouth at bedtime.    11/26/2016 at 2008  . ranitidine (ZANTAC) 150 MG tablet Take 150 mg by mouth daily at 6 PM.    11/26/2016 at 1715  . senna-docusate (SENOKOT-S) 8.6-50 MG tablet Take 2 tablets by mouth 2 (two) times daily. 60 tablet 0 11/27/2016 at 545  . traMADol (ULTRAM) 50 MG tablet Take 1 tablet (50 mg total) by mouth every 6 (six) hours as needed for moderate pain. 10 tablet 0 11/26/2016 at 1715  . vancomycin (VANCOCIN) 1-5 GM/200ML-% SOLN Inject 200 mLs (1,000 mg total) into the vein every Monday, Wednesday, and Friday with hemodialysis. She will need this for at least 12 weeks, further course to be determined by infectious disease Dr. Sampson GoonFitzgerald (Patient taking differently: Inject 1,000 mg into the vein See admin instructions. Order dated 11/19/16 - Give 1 gram vancomycin on Monday, Wednesday, Friday at dialysis for at least 12 weeks ,  further course to be determined by infectious disease Dr. Sampson GoonFitzgerald) [id number] mL 0 unknown  . warfarin (COUMADIN) 2.5 MG tablet Take 2.5 mg by mouth daily at 6 PM.    11/26/2016 at 1715    Assessment: Natalie BergamoJoann Y Hopkinsis a 83 y.o.femalewith PMH significant for ESRD  W/ HD on MWF and A.Fib on warfarin prior to admission. Last warfarin dose PTA 6/28. INR today 1.31, HgB 12.3, PLT 282. Pharmacy asked to dose resume warfarin and dose heparin until INR therapeutic.  Warfarin dose PTA: 2.5 mg PO  daily  Goal of Therapy:  INR 2-3 Heparin level 0.3-0.7 units/ml Monitor platelets by anticoagulation protocol: Yes   Plan:  Warfarin 5mg  PO x1 tonight Start heparin infusion at 950 units/hr Check anti-Xa level in 8 hours and daily while on heparin Continue to monitor H&H and platelets  Berlyn Saylor L Neymar Dowe 12/01/2016,5:23 PM

## 2016-12-01 NOTE — Progress Notes (Signed)
PT Cancellation Note  Patient Details Name: [name] MRN: [id number] DOB: [birth year]   Cancelled Treatment:    Reason Eval/Treat Not Completed: Patient at procedure or test/unavailable, will follow up as able   Fabio Asaevon J Rithvik Orcutt 12/01/2016, 8:13 AM Charlotte Crumbevon Shacarra Choe, PT DPT [facility] [phone]

## 2016-12-01 NOTE — Sedation Documentation (Signed)
Back to bed, tolerated well.  No c/o pain at this time.  Dr D in pt moving legs without difficulty

## 2016-12-02 ENCOUNTER — Inpatient Hospital Stay (HOSPITAL_COMMUNITY): Payer: Medicare Other

## 2016-12-02 DIAGNOSIS — I482 Chronic atrial fibrillation: Secondary | ICD-10-CM

## 2016-12-02 DIAGNOSIS — M86171 Other acute osteomyelitis, right ankle and foot: Secondary | ICD-10-CM

## 2016-12-02 DIAGNOSIS — E11621 Type 2 diabetes mellitus with foot ulcer: Secondary | ICD-10-CM

## 2016-12-02 LAB — CBC
HCT: 35.6 % — ABNORMAL LOW (ref 36.0–46.0)
HCT: 34.8 % — ABNORMAL LOW (ref 36.0–46.0)
Hemoglobin: 11.3 g/dL — ABNORMAL LOW (ref 12.0–15.0)
Hemoglobin: 10.7 g/dL — ABNORMAL LOW (ref 12.0–15.0)
MCH: 29.8 pg (ref 26.0–34.0)
MCH: 29 pg (ref 26.0–34.0)
MCHC: 31.7 g/dL (ref 30.0–36.0)
MCHC: 30.7 g/dL (ref 30.0–36.0)
MCV: 93.9 fL (ref 78.0–100.0)
MCV: 94.3 fL (ref 78.0–100.0)
PLATELETS: 259 10*3/uL (ref 150–400)
Platelets: 250 10*3/uL (ref 150–400)
RBC: 3.79 MIL/uL — ABNORMAL LOW (ref 3.87–5.11)
RBC: 3.69 MIL/uL — ABNORMAL LOW (ref 3.87–5.11)
RDW: 17.1 % — AB (ref 11.5–15.5)
RDW: 17.1 % — AB (ref 11.5–15.5)
WBC: 7.9 10*3/uL (ref 4.0–10.5)
WBC: 7.2 10*3/uL (ref 4.0–10.5)

## 2016-12-02 LAB — HEPARIN LEVEL (UNFRACTIONATED)
HEPARIN UNFRACTIONATED: 0.23 [IU]/mL — AB (ref 0.30–0.70)
Heparin Unfractionated: 0.37 IU/mL (ref 0.30–0.70)

## 2016-12-02 LAB — GLUCOSE, CAPILLARY
GLUCOSE-CAPILLARY: 128 mg/dL — AB (ref 65–99)
GLUCOSE-CAPILLARY: 179 mg/dL — AB (ref 65–99)
Glucose-Capillary: 102 mg/dL — ABNORMAL HIGH (ref 65–99)
Glucose-Capillary: 188 mg/dL — ABNORMAL HIGH (ref 65–99)

## 2016-12-02 LAB — RENAL FUNCTION PANEL
ALBUMIN: 2.5 g/dL — AB (ref 3.5–5.0)
ANION GAP: 12 (ref 5–15)
BUN: 39 mg/dL — AB (ref 6–20)
CALCIUM: 8.4 mg/dL — AB (ref 8.9–10.3)
CO2: 26 mmol/L (ref 22–32)
CREATININE: 5.4 mg/dL — AB (ref 0.44–1.00)
Chloride: 96 mmol/L — ABNORMAL LOW (ref 101–111)
GFR calc Af Amer: 8 mL/min — ABNORMAL LOW (ref >60)
GFR calc non Af Amer: 7 mL/min — ABNORMAL LOW (ref >60)
GLUCOSE: 183 mg/dL — AB (ref 65–99)
PHOSPHORUS: 2.7 mg/dL (ref 2.5–4.6)
Potassium: 4.3 mmol/L (ref 3.5–5.1)
SODIUM: 134 mmol/L — AB (ref 135–145)

## 2016-12-02 LAB — BASIC METABOLIC PANEL
ANION GAP: 11 (ref 5–15)
BUN: 27 mg/dL — ABNORMAL HIGH (ref 6–20)
CALCIUM: 8.3 mg/dL — AB (ref 8.9–10.3)
CO2: 25 mmol/L (ref 22–32)
CREATININE: 4.81 mg/dL — AB (ref 0.44–1.00)
Chloride: 96 mmol/L — ABNORMAL LOW (ref 101–111)
GFR calc Af Amer: 9 mL/min — ABNORMAL LOW (ref >60)
GFR, EST NON AFRICAN AMERICAN: 8 mL/min — AB (ref >60)
Glucose, Bld: 150 mg/dL — ABNORMAL HIGH (ref 65–99)
Potassium: 4 mmol/L (ref 3.5–5.1)
SODIUM: 132 mmol/L — AB (ref 135–145)

## 2016-12-02 LAB — PROTIME-INR
INR: 1.17
PROTHROMBIN TIME: 14.9 s (ref 11.4–15.2)

## 2016-12-02 MED ORDER — WARFARIN SODIUM 4 MG PO TABS
4.0000 mg | ORAL_TABLET | Freq: Once | ORAL | Status: AC
  Administered 2016-12-02: 4 mg via ORAL
  Filled 2016-12-02: qty 1

## 2016-12-02 MED ORDER — HEPARIN SODIUM (PORCINE) 1000 UNIT/ML DIALYSIS
20.0000 [IU]/kg | INTRAMUSCULAR | Status: DC | PRN
  Filled 2016-12-02: qty 2

## 2016-12-02 NOTE — Progress Notes (Signed)
Patient ID: [name], female   DOB: 1934/03/11, 83 y.o.   MRN: [id number]          [facility]    Date of Admission:  11/28/2016   Day 1 daptomycin         No organisms were seen on her lumbar aspirate Gram stain. Culture is no growth at less than 24 hours. I will continue daptomycin for now. If cultures remain negative she could be discharged on IV daptomycin given 3 times weekly after hemodialysis.         Cliffton AstersJohn Verlan Grotz, MD [facility] [phone] pager   [phone] cell 12/02/2016, 2:09 PM

## 2016-12-02 NOTE — Progress Notes (Signed)
ANTICOAGULATION CONSULT NOTE - Follow Up Consult  Pharmacy Consult for Heparin/ Coumadin Indication: atrial fibrillation  Allergies  Allergen Reactions  . Ceftazidime Other (See Comments)    myoclonus  . Codeine Itching and Other (See Comments)    Reaction:  Confusion/Hallucinations   . Contrast Media [Iodinated Diagnostic Agents] Other (See Comments)    "skin peel" breaks into a blistered rash, then peel. If given Benadryl and solumedrol she will be okay  . Oxycodone Other (See Comments)    Reaction:  Confusion/Hallucinations   . Quinine Derivatives Rash  . Tape Other (See Comments)    Skin is very thin and will blister and pull off skin with any tape except paper tape.  PLEASE USE PAPER TAPE.    Patient Measurements: Height: 5\' 5"  (165.1 cm) Weight: 144 lb 2.9 oz (65.4 kg) IBW/kg (Calculated) : 57 Heparin Dosing Weight:  65.4 kg  Vital Signs: Temp: 97.7 F (36.5 C) (07/04 0516) Temp Source: Oral (07/04 0516) BP: 160/66 (07/04 0516) Pulse Rate: 93 (07/04 0516)  Labs:  Recent Labs  11/30/16 0304 12/01/16 0321 12/02/16 0229 12/02/16 0844  HGB  --   --  11.3*  --   HCT  --   --  35.6*  --   PLT  --   --  259  --   LABPROT 20.0* 16.3* 14.9  --   INR 1.68 1.31 1.17  --   HEPARINUNFRC  --   --  0.23* 0.37  CREATININE 5.01* 3.64* 4.81*  --     Estimated Creatinine Clearance: 8.3 mL/min (A) (by C-G formula based on SCr of 4.81 mg/dL (H)).  Assessment:  Anticoag: Warf pta for afib was on hold for lumbar spine aspiration 7/3. INR 1.17 this AM. Hgb 11.3, Plts 259. Watch post post lumbar spine aspiration procedure. CHA2DS2-VASc Score at least 6. HL 0.23>0.37 (5 hr level) this AM.  - Coumadin pta: 2.5 mg daily (admit INR 3.45)   Goal of Therapy:  Heparin level 0.3-0.7 units/ml  INR 2-3 Monitor platelets by anticoagulation protocol: Yes   Plan:  Continue IV heparin at 1100 units/hr Coumadin 4mg  po x 1 tonight Daily HL, CBC, INR  [name], PharmD,  BCPS Clinical Staff Pharmacist Pager [phone]  [name], [name] 12/02/2016,1:05 PM

## 2016-12-02 NOTE — Progress Notes (Signed)
Orthopedic Tech Progress Note Patient Details:  Natalie BergamoJoann Y [name] August 02, 1933 [id number] Patient has brace. Patient ID: [name], female   DOB: August 02, 1933, 83 y.o.   MRN: [id number]   [name], [name] 12/02/2016, 3:12 PM

## 2016-12-02 NOTE — Progress Notes (Addendum)
PROGRESS NOTE    [name]   ZOX:[id number]  DOB: Apr 29, 1934  DOA: 11/28/2016 PCP: Rolm GalaGrandis, Heidi, MD   Brief Narrative:  [name] is a 83 y.o. female with medical history significant of ESRD dialysis on MWF, A.Fib on coumadin, HTN.  Patient is being treated for diskitis / osteomyelitis of L2-L3 with Vancomycin with dialysis. Aspirate in March demonstrated GPC in pairs on GS but cultures without growth. Despite treatment, her back pain has worsened and [facility]    CT reveals worsening L2-L3 discitis/ osteomyelitis with severe canal stenosis.  She was seen by ID on 6/29. They recommended to hold all antibiotics for a repeat aspiration of disc space and culture as soon as possible.   Subjective: Patient complains of back pain with minimal movement   Assessment & Plan:  Discitis and Osteomyelitis of lumbar spine  - worsening L2-L3 discitis/ osteomyelitis with severe canal stenosis on CT on 6/29   appreciate ID eval-  previously on Vanc, now started on daptomycin pending lumbar aspirate, Gram stain and culture as per Dr. Orvan Falconerampbell -Status post L2-L3 disc aspiration on 12/01/16 - needs to wear back brace which she has been resistant to wear- RN has contacted daughter to bring it into the hospital so she does not get charged for a new one-  Had pain when getting up yesterday to the chair and as expected did not want to wear the brace-  Continue Tramadol, Oxycodone, Neurontin and Flexaril ordered ID Dr Algis LimingVandam  had also ordered ankle-brachial index study on 11/29/16, results pending, likely secondary to concern for compromise blood flow to right heel Will order a TLSO brace      Chronic atrial fibrillation  - CHA2DS2-VASc Score at least 6 - Coumadin held for aspiration of disc space- placed on Heparin until INR therapeutic again- high risk for CVA - cont Cardizem and telemetry monitoring- currently in NSR  Supratherapeutic INR - INR 3.45 >> 2.79>> 1.68>>  1.31>1.17 -  given Vit K 2 mg given on 6/30    ESRD on dialysis   - MWF dialysis - appreciate nephrology follow up  Early cirrhosis - noted on CT 6/29  Right middle lobe pulm nodules - stable from last imaging 13 mo ago- f/u Ct in 1 yr  dCHF- chronic Patient was on lasix 80 BID as outpt - does not make urine and therefore discontinued -  mod conc hypertrophy on last ECHO 1/17    Diabetes mellitus 2 - low dose SSI- Levemir on hold  Hypokalemia - resolved  Hyponatremia - correct with dialysis / per nephro  Right sciatic nerve stimulator   DVT prophylaxis: SCDs- INR still therapeutic Code Status: DNR Family Communication:  Disposition Plan:  Consultants:   ID  IR Procedures:     Antimicrobials:  Anti-infectives    Start     Dose/Rate Route Frequency Ordered Stop   12/03/16 1500  DAPTOmycin (CUBICIN) 500 mg in sodium chloride 0.9 % IVPB  Status:  Discontinued     500 mg 220 mL/hr over 30 Minutes Intravenous Every 48 hours 12/01/16 1559 12/01/16 1614   12/01/16 1700  DAPTOmycin (CUBICIN) 500 mg in sodium chloride 0.9 % IVPB     500 mg 220 mL/hr over 30 Minutes Intravenous Every 48 hours 12/01/16 1614     12/01/16 1500  DAPTOmycin (CUBICIN) 523 mg in sodium chloride 0.9 % IVPB  Status:  Discontinued     8 mg/kg  65.4 kg 220.9  mL/hr over 30 Minutes Intravenous Every 48 hours 12/01/16 1217 12/01/16 1559       Objective: Vitals:   12/01/16 2136 12/01/16 2155 12/02/16 0059 12/02/16 0516  BP: 133/89 136/64 (!) 153/70 (!) 160/66  Pulse:  82 97 93  Resp:   18 18  Temp:  98 F (36.7 C) 97.8 F (36.6 C) 97.7 F (36.5 C)  TempSrc:  Oral Oral Oral  SpO2:  94% 96% 96%  Weight:      Height:        Intake/Output Summary (Last 24 hours) at 12/02/16 1016 Last data filed at 12/01/16 2127  Gross per 24 hour  Intake           171.74 ml  Output                0 ml  Net           171.74 ml   Filed Weights   11/28/16 0100 11/30/16 0644 11/30/16 0951  Weight:  66.7 kg (147 lb) 66.8 kg (147 lb 4.3 oz) 65.4 kg (144 lb 2.9 oz)    Examination: General exam: Appears comfortable today HEENT: PERRLA, oral mucosa moist, no sclera icterus or thrush Respiratory system: Clear to auscultation. Respiratory effort normal. Cardiovascular system: S1 & S2 heard, RRR.  No murmurs  Gastrointestinal system: Abdomen soft, non-tender, nondistended. Normal bowel sound. No organomegaly Central nervous system: Alert and oriented. No focal neurological deficits. Extremities: No cyanosis, clubbing or edema MSK- tenderness in lower back noted.  Skin: No rashes or ulcers Psychiatry:  Mood & affect appropriate.     Data Reviewed: I have personally reviewed following labs and imaging studies  CBC:  Recent Labs Lab 11/27/16 1617 12/02/16 0229  WBC 7.5 7.9  HGB 12.3 11.3*  HCT 36.8 35.6*  MCV 92.2 93.9  PLT 282 259   Basic Metabolic Panel:  Recent Labs Lab 11/28/16 0814 11/29/16 0213 11/30/16 0304 12/01/16 0321 12/02/16 0229  NA 130* 128* 127* 133* 132*  K 3.5 3.8 3.8 3.7 4.0  CL 97* 96* 94* 98* 96*  CO2 23 20* 21* 24 25  GLUCOSE 87 99 108* 110* 150*  BUN 16 24* 35* 17 27*  CREATININE 3.16* 3.94* 5.01* 3.64* 4.81*  CALCIUM 8.4* 8.2* 8.1* 8.3* 8.3*   GFR: Estimated Creatinine Clearance: 8.3 mL/min (A) (by C-G formula based on SCr of 4.81 mg/dL (H)). Liver Function Tests:  Recent Labs Lab 11/27/16 1617  AST 27  ALT 16  ALKPHOS 93  BILITOT 0.9  PROT 7.3  ALBUMIN 3.2*    Recent Labs Lab 11/27/16 1617  LIPASE 18   No results for input(s): AMMONIA in the last 168 hours. Coagulation Profile:  Recent Labs Lab 11/28/16 0300 11/29/16 0213 11/30/16 0304 12/01/16 0321 12/02/16 0229  INR 3.45 2.79 1.68 1.31 1.17   Cardiac Enzymes: No results for input(s): CKTOTAL, CKMB, CKMBINDEX, TROPONINI in the last 168 hours. BNP (last 3 results) No results for input(s): PROBNP in the last 8760 hours. HbA1C:  Recent Labs  11/30/16 0304    HGBA1C 5.5   CBG:  Recent Labs Lab 12/01/16 0615 12/01/16 1122 12/01/16 1634 12/01/16 2153 12/02/16 0641  GLUCAP 88 93 207* 140* 128*   Lipid Profile: No results for input(s): CHOL, HDL, LDLCALC, TRIG, CHOLHDL, LDLDIRECT in the last 72 hours. Thyroid Function Tests: No results for input(s): TSH, T4TOTAL, FREET4, T3FREE, THYROIDAB in the last 72 hours. Anemia Panel: No results for input(s): VITAMINB12, FOLATE, FERRITIN, TIBC, IRON, RETICCTPCT in  the last 72 hours. Urine analysis:    Component Value Date/Time   COLORURINE AMBER (A) 08/09/2016 2204   APPEARANCEUR TURBID (A) 08/09/2016 2204   LABSPEC 1.014 08/09/2016 2204   PHURINE 6.0 08/09/2016 2204   GLUCOSEU NEGATIVE 08/09/2016 2204   HGBUR MODERATE (A) 08/09/2016 2204   BILIRUBINUR NEGATIVE 08/09/2016 2204   KETONESUR NEGATIVE 08/09/2016 2204   PROTEINUR 100 (A) 08/09/2016 2204   NITRITE NEGATIVE 08/09/2016 2204   LEUKOCYTESUR LARGE (A) 08/09/2016 2204   Sepsis Labs: @LABRCNTIP (procalcitonin:4,lacticidven:4) ) Recent Results (from the past 240 hour(s))  Body fluid culture     Status: None (Preliminary result)   Collection Time: 12/01/16  8:50 AM  Result Value Ref Range Status   Specimen Description FLUID  Final   Special Requests L2 3 DISC ASPIRATE SENT 2 SYRINGES  Final   Gram Stain   Final    RARE WBC PRESENT, PREDOMINANTLY MONONUCLEAR NO ORGANISMS SEEN    Culture PENDING  Incomplete   Report Status PENDING  Incomplete         Radiology Studies: No results found.    Scheduled Meds: . acetaminophen  650 mg Oral QID  . calcium acetate  1,334 mg Oral TID WC  . collagenase   Topical Daily  . diltiazem  120 mg Oral QHS  . famotidine  20 mg Oral QHS  . feeding supplement (ENSURE ENLIVE)  237 mL Oral BID BM  . gabapentin  100 mg Oral Once per day on Sun Tue Thu Sat  . gabapentin  300 mg Oral Once per day on Mon Wed Fri  . insulin aspart  0-9 Units Subcutaneous TID WC  . lidocaine  1 patch  Transdermal Q24H  . multivitamin  1 tablet Oral Daily  . omega-3 acid ethyl esters  1,000 mg Oral Daily  . PARoxetine  40 mg Oral QHS  . pravastatin  20 mg Oral QPM  . senna-docusate  2 tablet Oral BID  . Warfarin - Pharmacist Dosing Inpatient   Does not apply q1800   Continuous Infusions: . DAPTOmycin (CUBICIN)  IV Stopped (12/01/16 1813)  . heparin 1,100 Units/hr (12/02/16 0325)     LOS: 4 days    Time spent in minutes: 35    Richarda Overlie, MD [facility] Pager: [email or web address] Password [email or web address] 12/02/2016, 10:16 AM

## 2016-12-02 NOTE — Evaluation (Signed)
Physical Therapy Evaluation Patient Details Name: [name] MRN: [id number] DOB: [birth year] Today's Date: 12/02/2016   History of Present Illness   [name] is a 83 y.o. female with medical history significant of ESRD dialysis on MWF, A.Fib on coumadin, HTN.  Patient has recently been battling diskitis / osteomyelitis of L2-L3.  Aspirate in March demonstrated GPC in pairs on GS but cultures without growth.  She was put on vancomycin with dialysis.  Despite this her back pain has worsened and she presented to the ED at [facility] once again today.  Work up there includes CT abd/pelvis which demonstrates worsening of her osteomyelitis.  Clinical Impression  Pt admitted with/for worsening back pain from worsening osteomyelitis per CT.  Pt needing mod assist or 1 to 2 persons for basic mobility/transfers.  Pt currently limited functionally due to the problems listed. ( See problems list.)   Pt will benefit from PT to maximize function and safety in order to get ready for next venue listed below.     Follow Up Recommendations SNF    Equipment Recommendations  None recommended by PT    Recommendations for Other Services       Precautions / Restrictions Precautions Precautions: Fall      Mobility  Bed Mobility Overal bed mobility: Needs Assistance Bed Mobility: Rolling;Sidelying to Sit;Sit to Sidelying Rolling: Min guard Sidelying to sit: Mod assist     Sit to sidelying: Mod assist;+2 for safety/equipment General bed mobility comments: cued for direction and assist just enough to allow pt to give maximal effort up from R sidelying.  Pt sat at EOB with UE assist while tlso donned.  Transfers Overall transfer level: Needs assistance Equipment used: Rolling walker (2 wheeled) Transfers: Sit to/from [employer] Sit to Stand: Min assist;+2 physical assistance Stand pivot transfers: Mod assist;+2 physical assistance       General transfer comment: cues for hand  placement, assist for coming forward and boost.  steadying assist in RW to pivot, 2nd person helping to maneuver the RW  Ambulation/Gait             General Gait Details: pivot to chair taking 6-8 steps.  Stairs            Wheelchair Mobility    Modified Rankin (Stroke Patients Only)       Balance Overall balance assessment: Needs assistance   Sitting balance-Leahy Scale: Poor Sitting balance - Comments: sat at EOB without assist, but needed UE assist for pain relief./   Standing balance support: Bilateral upper extremity supported Standing balance-Leahy Scale: Poor Standing balance comment: needs external support and AD                             Pertinent Vitals/Pain Pain Assessment: 0-10 Pain Score: 8  (5-10 throughout the treatment, mostly at 8/10) Pain Location: back and left hip Pain Descriptors / Indicators: Grimacing;Sore;Sharp Pain Intervention(s): Monitored during session;Limited activity within patient's tolerance;Repositioned    Home Living Family/patient expects to be discharged to:: Private residence Living Arrangements: Children Available Help at Discharge: Available 24 hours/day Type of Home: House Home Access: Ramped entrance     Home Layout: One level Home Equipment: Environmental consultant - 2 wheels;Hospital bed;Wheelchair - IT trainer;Adaptive equipment;Other (comment);Bedside commode;Transport chair Additional Comments: lift chair, handicap accessible van    Prior Function Level of Independence: Needs assistance   Gait / Transfers Assistance Needed: pt has taken 7-8 steps in rehab  at [facility]  ADL's / Homemaking Assistance Needed: assist for pivot, assisted ADL's in sitting/lying.        Hand Dominance   Dominant Hand: Right    Extremity/Trunk Assessment   Upper Extremity Assessment Upper Extremity Assessment: Defer to OT evaluation    Lower Extremity Assessment Lower Extremity Assessment: Generalized weakness  (proximal weakness bil from relative inactivity)       Communication   Communication: No difficulties  Cognition Arousal/Alertness: Awake/alert Behavior During Therapy: WFL for tasks assessed/performed Overall Cognitive Status: Within Functional Limits for tasks assessed                                        General Comments      Exercises     Assessment/Plan    PT Assessment Patient needs continued PT services  PT Problem List Decreased strength;Decreased range of motion;Decreased balance;Decreased mobility;Decreased knowledge of use of DME;Pain       PT Treatment Interventions Gait training;DME instruction;Functional mobility training;Therapeutic activities;Balance training;Patient/family education    PT Goals (Current goals can be found in the Care Plan section)  Acute Rehab PT Goals Patient Stated Goal: have less pain PT Goal Formulation: With patient Time For Goal Achievement: 12/16/16 Potential to Achieve Goals: Fair    Frequency Min 3X/week   Barriers to discharge        Co-evaluation               AM-PAC PT "6 Clicks" Daily Activity  Outcome Measure Difficulty turning over in bed (including adjusting bedclothes, sheets and blankets)?: Total Difficulty moving from lying on back to sitting on the side of the bed? : Total Difficulty sitting down on and standing up from a chair with arms (e.g., wheelchair, bedside commode, etc,.)?: Total Help needed moving to and from a bed to chair (including a wheelchair)?: A Lot Help needed walking in hospital room?: A Lot Help needed climbing 3-5 steps with a railing? : Total 6 Click Score: 8    End of Session   Activity Tolerance: Patient tolerated treatment well Patient left: in bed;with call bell/phone within reach;with bed alarm set Nurse Communication: Mobility status PT Visit Diagnosis: Muscle weakness (generalized) (M62.81);Unsteadiness on feet (R26.81);Other abnormalities of gait and  mobility (R26.89);Pain Pain - Right/Left: Left Pain - part of body: Hip (back, )    Time: [id number] PT Time Calculation (min) (ACUTE ONLY): 25 min   Charges:   PT Evaluation $PT Eval Moderate Complexity: 1 Procedure PT Treatments $Therapeutic Activity: 8-22 mins   PT G Codes:        12/02/2016  [place] BingKen Jathen Sudano, PT [phone]  (pager)  Eliseo GumKenneth V Justene Jensen 12/02/2016, 3:44 PM

## 2016-12-02 NOTE — Progress Notes (Signed)
VASCULAR LAB PRELIMINARY  ARTERIAL  ABI completed: Unable to obtain bilateral ABI's due to non-compressible arteries likely due to medial calcification. Right TBI of 0.14 and left TBI of 0.36 is suggestive of abnormal arterial flow at rest.   RIGHT    LEFT    PRESSURE WAVEFORM  PRESSURE WAVEFORM  BRACHIAL HD access  BRACHIAL 149 Triphasic  DP >254 Monophasic DP >254 Monophasic  PT >254 Monophasic PT >254 Monophasic  GREAT TOE 21 NA GREAT TOE 54 NA    RIGHT LEFT  ABI      [name], RVT 12/02/2016, 10:16 AM

## 2016-12-02 NOTE — Progress Notes (Signed)
Patient ID: [name], female   DOB: 18-Dec-1933, 83 y.o.   MRN: [id number]  [facility] Progress Note    Subjective:   No complaints   Objective:   BP (!) 160/66 (BP Location: Left Arm)   Pulse 93   Temp 97.7 F (36.5 C) (Oral)   Resp 18   Ht 5\' 5"  (1.651 m)   Wt 65.4 kg (144 lb 2.9 oz)   SpO2 96%   BMI 23.99 kg/m   Intake/Output: I/O last 3 completed shifts: In: 411.7 [P.O.:300; I.V.:1.7; IV Piggyback:110] Out: -    Intake/Output this shift:  No intake/output data recorded. Weight change:   Physical Exam: Gen:wd frail, elderly wf in nad CVS:no rub Resp:cta OZH:YQMVHQAbd:benign Ext: RUE AVF +T/B  Labs: BMET  Recent Labs Lab 11/27/16 1617 11/28/16 0814 11/29/16 0213 11/30/16 0304 12/01/16 0321 12/02/16 0229  NA 131* 130* 128* 127* 133* 132*  K 2.9* 3.5 3.8 3.8 3.7 4.0  CL 94* 97* 96* 94* 98* 96*  CO2 27 23 20* 21* 24 25  GLUCOSE 91 87 99 108* 110* 150*  BUN 13 16 24* 35* 17 27*  CREATININE 2.26* 3.16* 3.94* 5.01* 3.64* 4.81*  ALBUMIN 3.2*  --   --   --   --   --   CALCIUM 9.0 8.4* 8.2* 8.1* 8.3* 8.3*   CBC  Recent Labs Lab 11/27/16 1617 12/02/16 0229  WBC 7.5 7.9  HGB 12.3 11.3*  HCT 36.8 35.6*  MCV 92.2 93.9  PLT 282 259    @IMGRELPRIORS @ Medications:    . acetaminophen  650 mg Oral QID  . calcium acetate  1,334 mg Oral TID WC  . collagenase   Topical Daily  . diltiazem  120 mg Oral QHS  . famotidine  20 mg Oral QHS  . feeding supplement (ENSURE ENLIVE)  237 mL Oral BID BM  . gabapentin  100 mg Oral Once per day on Sun Tue Thu Sat  . gabapentin  300 mg Oral Once per day on Mon Wed Fri  . insulin aspart  0-9 Units Subcutaneous TID WC  . lidocaine  1 patch Transdermal Q24H  . multivitamin  1 tablet Oral Daily  . omega-3 acid ethyl esters  1,000 mg Oral Daily  . PARoxetine  40 mg Oral QHS  . pravastatin  20 mg Oral QPM  . senna-docusate  2 tablet Oral BID  . Warfarin - Pharmacist Dosing Inpatient   Does not apply q1800    Dialysis Access: RUA AVF Pos. Bruit   [facility]: [facility] [place] . EDW 67kgHD Bath 2k, 2.5 ca Time 3hrs Heparin 2000 bolus with infusion hourly 400 . Access RUA AVF 15 g BFR 450DFR 800 Epogen 2200Units IV/HD Venofer 50mg  weekly Hd / NO vit d listed   Assessment/Plan: 1. Osteomyelitis/diskitis of L2-3, awaiting culture results from aspirate on 12/01/16, ID following and recommending IV daptomycin 2. ESRD- continue on MWF schedule [facility]  Challenging edw due to ^ BP 3. HTN/volume- stable 4. Chronic A fib- on coumadin 5. DM- per primary 6. Anemia of chronic kidney disease- ESA on hold due to Hgb >12 but now down to 11.3, will restart if cont to drop. 7. Disposition- per primary.  Irena CordsJoseph A. Treana Lacour, MD [facility], [facility] Pager [phone] 12/02/2016, 9:16 AM

## 2016-12-02 NOTE — Progress Notes (Signed)
ANTICOAGULATION CONSULT NOTE - Initial Consult  Pharmacy Consult for heparin and warfarin Indication: atrial fibrillation  Allergies  Allergen Reactions  . Ceftazidime Other (See Comments)    myoclonus  . Codeine Itching and Other (See Comments)    Reaction:  Confusion/Hallucinations   . Contrast Media [Iodinated Diagnostic Agents] Other (See Comments)    "skin peel" breaks into a blistered rash, then peel. If given Benadryl and solumedrol she will be okay  . Oxycodone Other (See Comments)    Reaction:  Confusion/Hallucinations   . Quinine Derivatives Rash  . Tape Other (See Comments)    Skin is very thin and will blister and pull off skin with any tape except paper tape.  PLEASE USE PAPER TAPE.   Patient Measurements: Height: 5\' 5"  (165.1 cm) Weight: 144 lb 2.9 oz (65.4 kg) IBW/kg (Calculated) : 57 Heparin Dosing Weight: 65.4 Kg  Vital Signs: Temp: 97.8 F (36.6 C) (07/04 0059) Temp Source: Oral (07/04 0059) BP: 153/70 (07/04 0059) Pulse Rate: 97 (07/04 0059)  Labs:  Recent Labs  11/30/16 0304 12/01/16 0321 12/02/16 0229  HGB  --   --  11.3*  HCT  --   --  35.6*  PLT  --   --  259  LABPROT 20.0* 16.3* 14.9  INR 1.68 1.31 1.17  HEPARINUNFRC  --   --  0.23*  CREATININE 5.01* 3.64* 4.81*   Estimated Creatinine Clearance: 8.3 mL/min (A) (by C-G formula based on SCr of 4.81 mg/dL (H)).  Medical History: Past Medical History:  Diagnosis Date  . Anemia   . Anxiety   . Arthritis    gout  . Atrial fibrillation (HCC)   . CHF (congestive heart failure) (HCC)    has had this off and on prior to dialysis but has had several times since  . Chronic kidney disease    end stage renal insufficiency.  on dialysis M,W,F  . Diabetes mellitus without complication (HCC)   . Dialysis patient [facility])    Mon.- Wed- Fri.  . Dysrhythmia    afib/flutter  . GERD (gastroesophageal reflux disease)   . Hypertension   . 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 Peripheral  vascular disease (HCC)   . Pleural effusion   . Pulmonary hypertension (HCC)   . Renal insufficiency   . Restless leg syndrome   . Shortness of breath dyspnea    with exertion  . Stroke (HCC) 06/2015   has had 1 other stroke. first one affected vision. the most recent one affected right arm   Medications:  Prescriptions Prior to Admission  Medication Sig Dispense Refill Last Dose  . acetaminophen (TYLENOL) 650 MG CR tablet Take 1,300 mg by mouth 2 (two) times daily.   11/27/2016 at 806  . albuterol (PROVENTIL HFA;VENTOLIN HFA) 108 (90 Base) MCG/ACT inhaler Inhale 1 puff into the lungs every 6 (six) hours as needed for wheezing or shortness of breath.    unknown  . Amino Acids-Protein Hydrolys (FEEDING SUPPLEMENT, PRO-STAT SUGAR FREE 64,) LIQD Take 30 mLs by mouth 3 (three) times daily with meals.   11/27/2016 at 1329  . B Complex-C-Folic Acid (RENAL-VITE) 0.8 MG TABS Take 0.8 mg by mouth daily.   11/27/2016 at 806  . Biotin 1 MG CAPS Take 1 mg by mouth daily.    11/27/2016 at 545  . calcium acetate (PHOSLO) 667 MG tablet Take 2,001 mg by mouth 3 (three) times daily with meals. With any food   11/27/2016  at 545  . cyclobenzaprine (FLEXERIL) 5 MG tablet Take 1 tablet (5 mg total) by mouth 3 (three) times daily as needed for muscle spasms. 10 tablet 0 11/26/2016 at 1715  . diltiazem (CARDIZEM CD) 120 MG 24 hr capsule Take 120 mg by mouth daily at 6 PM.    11/26/2016 at 1715  . furosemide (LASIX) 80 MG tablet Take 80 mg by mouth 2 (two) times daily.   11/27/2016 at 545  . gabapentin (NEURONTIN) 100 MG capsule Take 100 mg by mouth See admin instructions. Take 1 capsule (100 mg) by mouth Sunday,Tuesday, Thursday, Saturday at bedtime   11/26/2016 at 2008  . gabapentin (NEURONTIN) 300 MG capsule Take 300 mg by mouth See admin instructions. Take 1 capsule (300 mg) by mouth Monday, Wednesday, Friday at bedtime   11/25/2016 at 2045  . insulin detemir (LEVEMIR) 100 UNIT/ML injection Inject 0.06 mLs (6 Units total)  into the skin 2 (two) times daily. 10 mL 11 11/27/2016 at 545  . lidocaine (LIDODERM) 5 % Place 1 patch onto the skin daily. Remove & Discard patch within 12 hours or as directed by MD 2 patch 0 11/27/2016 at 806  . lidocaine-prilocaine (EMLA) cream Apply 1 application topically See admin instructions. Apply to dialysis port on Monday, Wednesday and Friday at 10am (before dialysis)   11/27/2016 at 947  . Multiple Vitamin (MULTIVITAMIN WITH MINERALS) TABS tablet Take 1 tablet by mouth daily at 6 PM.   11/26/2016 at 1715  . Omega-3 Fatty Acids (FISH OIL) 1000 MG CAPS Take 1,000 mg by mouth 2 (two) times daily.   11/27/2016 at 545  . oxyCODONE (OXY IR/ROXICODONE) 5 MG immediate release tablet Take 1 tablet (5 mg total) by mouth every 4 (four) hours as needed for severe pain. 5 tablet 0 11/27/2016 at 751  . OXYGEN Inhale 2 L into the lungs continuous.   11/28/2016 at Unknown time  . PARoxetine (PAXIL) 40 MG tablet Take 40 mg by mouth at bedtime.    11/26/2016 at 2008  . polyethylene glycol (MIRALAX / GLYCOLAX) packet Take 17 g by mouth daily as needed (constipation). Mix in 4-8 oz liquid and drink   unknown  . pravastatin (PRAVACHOL) 20 MG tablet Take 20 mg by mouth at bedtime.    11/26/2016 at 2008  . ranitidine (ZANTAC) 150 MG tablet Take 150 mg by mouth daily at 6 PM.    11/26/2016 at 1715  . senna-docusate (SENOKOT-S) 8.6-50 MG tablet Take 2 tablets by mouth 2 (two) times daily. 60 tablet 0 11/27/2016 at 545  . traMADol (ULTRAM) 50 MG tablet Take 1 tablet (50 mg total) by mouth every 6 (six) hours as needed for moderate pain. 10 tablet 0 11/26/2016 at 1715  . vancomycin (VANCOCIN) 1-5 GM/200ML-% SOLN Inject 200 mLs (1,000 mg total) into the vein every Monday, Wednesday, and Friday with hemodialysis. She will need this for at least 12 weeks, further course to be determined by infectious disease Dr. Sampson Goon (Patient taking differently: Inject 1,000 mg into the vein See admin instructions. Order dated 11/19/16 -  Give 1 gram vancomycin on Monday, Wednesday, Friday at dialysis for at least 12 weeks , further course to be determined by infectious disease Dr. Sampson Goon) 16109 mL 0 unknown  . warfarin (COUMADIN) 2.5 MG tablet Take 2.5 mg by mouth daily at 6 PM.    11/26/2016 at 1715    Assessment: Natalie Carnevale Hopkinsis a 83 y.o.femalewith PMH significant for ESRD on HD and A.Fib on warfarin prior  to admission. Pharmacy asked to resume warfarin and dose heparin until INR therapeutic. Heparin level subtherapeutic at 0.23, Hgb low-stable, and platelets normal. No issues with line per nursing. No overt s/s bleeding noted.   Warfarin dose PTA: 2.5 mg PO daily  Goal of Therapy:  INR 2-3 Heparin level 0.3-0.7 units/ml Monitor platelets by anticoagulation protocol: Yes   Plan:  Increase heparin gtt to 1100 units/hr  Heparin level in 8 hours Daily heparin level and CBC Monitor for s/s bleeding   York CeriseKatherine Cook, PharmD Pharmacy Resident  Pager [phone] 12/02/16 3:14 AM

## 2016-12-03 ENCOUNTER — Encounter (INDEPENDENT_AMBULATORY_CARE_PROVIDER_SITE_OTHER): Payer: Medicare Other | Admitting: Vascular Surgery

## 2016-12-03 ENCOUNTER — Encounter (HOSPITAL_COMMUNITY): Payer: Self-pay | Admitting: Interventional Radiology

## 2016-12-03 ENCOUNTER — Encounter (INDEPENDENT_AMBULATORY_CARE_PROVIDER_SITE_OTHER): Payer: Medicare Other

## 2016-12-03 DIAGNOSIS — E08621 Diabetes mellitus due to underlying condition with foot ulcer: Secondary | ICD-10-CM

## 2016-12-03 DIAGNOSIS — L97414 Non-pressure chronic ulcer of right heel and midfoot with necrosis of bone: Secondary | ICD-10-CM

## 2016-12-03 LAB — GLUCOSE, CAPILLARY
GLUCOSE-CAPILLARY: 114 mg/dL — AB (ref 65–99)
Glucose-Capillary: 186 mg/dL — ABNORMAL HIGH (ref 65–99)
Glucose-Capillary: 141 mg/dL — ABNORMAL HIGH (ref 65–99)
Glucose-Capillary: 197 mg/dL — ABNORMAL HIGH (ref 65–99)
Glucose-Capillary: 85 mg/dL (ref 65–99)

## 2016-12-03 LAB — COMPREHENSIVE METABOLIC PANEL
ALBUMIN: 2.8 g/dL — AB (ref 3.5–5.0)
ALK PHOS: 73 U/L (ref 38–126)
ALT: 12 U/L — ABNORMAL LOW (ref 14–54)
ANION GAP: 10 (ref 5–15)
AST: 20 U/L (ref 15–41)
BILIRUBIN TOTAL: 0.6 mg/dL (ref 0.3–1.2)
BUN: 19 mg/dL (ref 6–20)
CALCIUM: 8.2 mg/dL — AB (ref 8.9–10.3)
CO2: 26 mmol/L (ref 22–32)
Chloride: 95 mmol/L — ABNORMAL LOW (ref 101–111)
Creatinine, Ser: 3.24 mg/dL — ABNORMAL HIGH (ref 0.44–1.00)
GFR calc Af Amer: 14 mL/min — ABNORMAL LOW (ref >60)
GFR, EST NON AFRICAN AMERICAN: 12 mL/min — AB (ref >60)
GLUCOSE: 174 mg/dL — AB (ref 65–99)
Potassium: 3.8 mmol/L (ref 3.5–5.1)
Sodium: 131 mmol/L — ABNORMAL LOW (ref 135–145)
TOTAL PROTEIN: 6.4 g/dL — AB (ref 6.5–8.1)

## 2016-12-03 LAB — HEPARIN LEVEL (UNFRACTIONATED)
HEPARIN UNFRACTIONATED: 1.68 [IU]/mL — AB (ref 0.30–0.70)
HEPARIN UNFRACTIONATED: 0.32 [IU]/mL (ref 0.30–0.70)

## 2016-12-03 LAB — CBC
HEMATOCRIT: 35.9 % — AB (ref 36.0–46.0)
HEMOGLOBIN: 11.3 g/dL — AB (ref 12.0–15.0)
MCH: 29.5 pg (ref 26.0–34.0)
MCHC: 31.5 g/dL (ref 30.0–36.0)
MCV: 93.7 fL (ref 78.0–100.0)
Platelets: 242 10*3/uL (ref 150–400)
RBC: 3.83 MIL/uL — ABNORMAL LOW (ref 3.87–5.11)
RDW: 17.1 % — ABNORMAL HIGH (ref 11.5–15.5)
WBC: 7.9 10*3/uL (ref 4.0–10.5)

## 2016-12-03 LAB — PROTIME-INR
INR: 1.37
PROTHROMBIN TIME: 17 s — AB (ref 11.4–15.2)

## 2016-12-03 MED ORDER — OXYCODONE HCL 5 MG PO TABS: 5.0000 mg | ORAL_TABLET | ORAL | 0 refills | Status: DC | PRN

## 2016-12-03 MED ORDER — DAPTOMYCIN 500 MG IV SOLR
500.0000 mg | INTRAVENOUS | 0 refills | Status: DC
Start: 2016-12-04 — End: 2016-12-04

## 2016-12-03 MED ORDER — GABAPENTIN 600 MG PO TABS
300.0000 mg | ORAL_TABLET | Freq: Every day | ORAL | Status: DC
  Administered 2016-12-03 – 2016-12-06 (×4): 300 mg via ORAL
  Filled 2016-12-03 (×4): qty 1

## 2016-12-03 MED ORDER — ENOXAPARIN SODIUM 80 MG/0.8ML ~~LOC~~ SOLN: 65.0000 mg | SUBCUTANEOUS | 0 refills | Status: AC

## 2016-12-03 MED ORDER — WARFARIN SODIUM 4 MG PO TABS
4.0000 mg | ORAL_TABLET | Freq: Once | ORAL | Status: AC
  Administered 2016-12-03: 4 mg via ORAL
  Filled 2016-12-03: qty 1

## 2016-12-03 MED ORDER — SODIUM CHLORIDE 0.9 % IV SOLN
500.0000 mg | INTRAVENOUS | Status: DC
  Administered 2016-12-03 – 2016-12-09 (×4): 500 mg via INTRAVENOUS
  Filled 2016-12-03 (×5): qty 10

## 2016-12-03 MED ORDER — OXYCODONE HCL ER 10 MG PO T12A
10.0000 mg | EXTENDED_RELEASE_TABLET | Freq: Two times a day (BID) | ORAL | Status: DC
  Administered 2016-12-03 – 2016-12-06 (×8): 10 mg via ORAL
  Filled 2016-12-03 (×9): qty 1

## 2016-12-03 MED ORDER — ENSURE ENLIVE PO LIQD: 237.0000 mL | Freq: Two times a day (BID) | ORAL | 12 refills | Status: AC

## 2016-12-03 MED ORDER — ENOXAPARIN SODIUM 80 MG/0.8ML ~~LOC~~ SOLN
1.0000 mg/kg | SUBCUTANEOUS | Status: DC
  Administered 2016-12-03 – 2016-12-07 (×3): 65 mg via SUBCUTANEOUS
  Filled 2016-12-03 (×5): qty 0.8

## 2016-12-03 NOTE — Discharge Summary (Addendum)
Physician Discharge Summary  [name] MRN: [id number] DOB/AGE: 1933-10-02 83 y.o.  PCP: Hortencia Pilar, MD   Admit date: 11/28/2016 Discharge date: 12/03/2016  Discharge Diagnoses:    Principal Problem:   Osteomyelitis of lumbar spine [facility]) Active Problems:   ESRD on dialysis [facility])   Diabetes mellitus ([place])   Chronic atrial fibrillation ([place])   Acute osteomyelitis of right calcaneus ([place])   Diabetic ulcer of right heel associated with diabetes mellitus due to underlying condition, with necrosis of bone ([place])  addendum Dc cancelled due to uncontrolled pain, inability to tolerate HD     Follow-up recommendations Follow-up with PCP in 3-5 days , including all  additional recommended appointments as below Follow-up CBC, CMP in 3-5 days Please check INR on a daily basis, discontinue Lovenox when INR greater than 2.0 Continue IV daptomycin after hemodialysis on Monday Wednesday Friday until 8/15 Patient to follow-up with Dr. Ola Spurr, infectious disease in [facility]  Current Discharge Medication List    START taking these medications   Details  DAPTOmycin 500 mg in sodium chloride 0.9 % 100 mL Inject 500 mg into the vein every Monday, Wednesday, and Friday with hemodialysis. Qty: 20 ampule, Refills: 0    enoxaparin (LOVENOX) 80 MG/0.8ML injection Inject 0.65 mLs (65 mg total) into the skin daily. Qty: 7 Syringe, Refills: 0    feeding supplement, ENSURE ENLIVE, (ENSURE ENLIVE) LIQD Take 237 mLs by mouth 2 (two) times daily between meals. Qty: 237 mL, Refills: 12      CONTINUE these medications which have CHANGED   Details  oxyCODONE (OXY IR/ROXICODONE) 5 MG immediate release tablet Take 1 tablet (5 mg total) by mouth every 4 (four) hours as needed for severe pain. Qty: 5 tablet, Refills: 0      CONTINUE these medications which have NOT CHANGED   Details  acetaminophen (TYLENOL) 650 MG CR tablet Take 1,300 mg by mouth 2 (two) times daily.    albuterol  (PROVENTIL HFA;VENTOLIN HFA) 108 (90 Base) MCG/ACT inhaler Inhale 1 puff into the lungs every 6 (six) hours as needed for wheezing or shortness of breath.     Amino Acids-Protein Hydrolys (FEEDING SUPPLEMENT, PRO-STAT SUGAR FREE 64,) LIQD Take 30 mLs by mouth 3 (three) times daily with meals.    B Complex-C-Folic Acid (RENAL-VITE) 0.8 MG TABS Take 0.8 mg by mouth daily.    Biotin 1 MG CAPS Take 1 mg by mouth daily.     calcium acetate (PHOSLO) 667 MG tablet Take 2,001 mg by mouth 3 (three) times daily with meals. With any food    cyclobenzaprine (FLEXERIL) 5 MG tablet Take 1 tablet (5 mg total) by mouth 3 (three) times daily as needed for muscle spasms. Qty: 10 tablet, Refills: 0    diltiazem (CARDIZEM CD) 120 MG 24 hr capsule Take 120 mg by mouth daily at 6 PM.     !! gabapentin (NEURONTIN) 100 MG capsule Take 100 mg by mouth See admin instructions. Take 1 capsule (100 mg) by mouth Sunday,Tuesday, Thursday, Saturday at bedtime    !! gabapentin (NEURONTIN) 300 MG capsule Take 300 mg by mouth See admin instructions. Take 1 capsule (300 mg) by mouth Monday, Wednesday, Friday at bedtime    insulin detemir (LEVEMIR) 100 UNIT/ML injection Inject 0.06 mLs (6 Units total) into the skin 2 (two) times daily. Qty: 10 mL, Refills: 11    lidocaine (LIDODERM) 5 % Place 1 patch onto the skin daily. Remove & Discard patch within 12 hours or as directed  by MD Otho Darner: 2 patch, Refills: 0    lidocaine-prilocaine (EMLA) cream Apply 1 application topically See admin instructions. Apply to dialysis port on Monday, Wednesday and Friday at 10am (before dialysis)    Multiple Vitamin (MULTIVITAMIN WITH MINERALS) TABS tablet Take 1 tablet by mouth daily at 6 PM.    Omega-3 Fatty Acids (FISH OIL) 1000 MG CAPS Take 1,000 mg by mouth 2 (two) times daily.    OXYGEN Inhale 2 L into the lungs continuous.    PARoxetine (PAXIL) 40 MG tablet Take 40 mg by mouth at bedtime.     polyethylene glycol (MIRALAX / GLYCOLAX)  packet Take 17 g by mouth daily as needed (constipation). Mix in 4-8 oz liquid and drink    pravastatin (PRAVACHOL) 20 MG tablet Take 20 mg by mouth at bedtime.     ranitidine (ZANTAC) 150 MG tablet Take 150 mg by mouth daily at 6 PM.     senna-docusate (SENOKOT-S) 8.6-50 MG tablet Take 2 tablets by mouth 2 (two) times daily. Qty: 60 tablet, Refills: 0    traMADol (ULTRAM) 50 MG tablet Take 1 tablet (50 mg total) by mouth every 6 (six) hours as needed for moderate pain. Qty: 10 tablet, Refills: 0    warfarin (COUMADIN) 2.5 MG tablet Take 2.5 mg by mouth daily at 6 PM.      !! - Potential duplicate medications found. Please discuss with provider.    STOP taking these medications     furosemide (LASIX) 80 MG tablet      vancomycin (VANCOCIN) 1-5 FV/494WH-% SOLN      folic acid-vitamin b complex-vitamin c-selenium-zinc (DIALYVITE) 3 MG TABS tablet              Discharge Condition:Stable Discharge Instructions Get Medicines reviewed and adjusted: Please take all your medications with you for your next visit with your Primary MD  Please request your Primary MD to go over all hospital tests and procedure/radiological results at the follow up, please ask your Primary MD to get all Hospital records sent to his/her office.  If you experience worsening of your admission symptoms, develop shortness of breath, life threatening emergency, suicidal or homicidal thoughts you must seek medical attention immediately by calling 911 or calling your MD immediately if symptoms less severe.  You must read complete instructions/literature along with all the possible adverse reactions/side effects for all the Medicines you take and that have been prescribed to you. Take any new Medicines after you have completely understood and accpet all the possible adverse reactions/side effects.   Do not drive when taking Pain medications.   Do not take more than prescribed Pain, Sleep and Anxiety  Medications  Special Instructions: If you have smoked or chewed Tobacco in the last 2 yrs please stop smoking, stop any regular Alcohol and or any Recreational drug use.  Wear Seat belts while driving.  Please note  You were cared for by a hospitalist during your hospital stay. Once you are discharged, your primary care physician will handle any further medical issues. Please note that NO REFILLS for any discharge medications will be authorized once you are discharged, as it is imperative that you return to your primary care physician (or establish a relationship with a primary care physician if you do not have one) for your aftercare needs so that they can reassess your need for medications and monitor your lab values.     Allergies  Allergen Reactions  . Ceftazidime Other (See Comments)  myoclonus  . Codeine Itching and Other (See Comments)    Reaction:  Confusion/Hallucinations   . Contrast Media [Iodinated Diagnostic Agents] Other (See Comments)    "skin peel" breaks into a blistered rash, then peel. If given Benadryl and solumedrol she will be okay  . Oxycodone Other (See Comments)    Reaction:  Confusion/Hallucinations   . Quinine Derivatives Rash  . Tape Other (See Comments)    Skin is very thin and will blister and pull off skin with any tape except paper tape.  PLEASE USE PAPER TAPE.      Disposition: SNF   Consults:  Infectious disease    Significant Diagnostic Studies:  Ct Abdomen Pelvis Wo Contrast  Result Date: 11/27/2016 CLINICAL DATA:  Acute on chronic low back pain, abdominal for 2 weeks after dialysis. History of hypertension, diabetes, cholecystectomy, and lumbar discitis osteomyelitis. EXAM: CT ABDOMEN AND PELVIS WITHOUT CONTRAST TECHNIQUE: Multidetector CT imaging of the abdomen and pelvis was performed following the standard protocol without IV contrast. Oral contrast administered. COMPARISON:  CT abdomen and pelvis August 09, 2016 and Oct 12, 2015  FINDINGS: LOWER CHEST: Small pleural effusions. Stable 8 mm and 7 mm RIGHT middle lobe pulmonary nodules stable from Oct 12, 2015. Bibasilar atelectasis. The heart is mildly enlarged. Severe mitral annular calcifications. No pericardial effusion. HEPATOBILIARY: Mildly dense and slightly nodular liver. Status post cholecystectomy. PANCREAS: Normal. SPLEEN: Normal. ADRENALS/URINARY TRACT: Kidneys are orthotopic, Atrophic kidneys bilaterally. Stable 13 mm RIGHT upper pole calcifications without obstructive uropathy. Limited assessment for mass on noncontrast CT. Thickened adrenal gland compatible with hyperplasia decompressed urinary bladder predominately obscured. STOMACH/BOWEL: Small hiatal hernia. The stomach, small and large bowel are normal in course and caliber without inflammatory changes. Mild colonic diverticulosis. Normal appendix. VASCULAR/LYMPHATIC: Aortoiliac vessels are normal in course and caliber, severe. No lymphadenopathy by CT size criteria. REPRODUCTIVE: Normal. OTHER: No intraperitoneal free fluid or free air. MUSCULOSKELETAL: Increasing disc widening L2-3 associated with endplate erosion and sclerosis. Severe canal stenosis at L2-3. Bilateral hip total arthroplasties resultant streak artifact limiting assessment of the pelvis. RIGHT sciatic nerve stimulator. Trophic pelvic musculature. Small fat containing umbilical hernia. IMPRESSION: 1. No acute intra-abdominopelvic process by noncontrast CT. 2. Worsening L2-3 discitis osteomyelitis. 3. 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 4. Thirteen month stability RIGHT middle lobe pulmonary nodules. Recommend follow-up CT chest in 1 year to complete standard recommendation for 2 year stability. Aortic Atherosclerosis (ICD10-I70.0). Electronically Signed   By: Elon Alas M.D.   On: 11/27/2016 19:37   Ct Lumbar Spine Wo Contrast  Result Date: 11/05/2016 CLINICAL DATA:  Pt states lower back pain x 36mo , pt states hx infection in lower back/discs,  bilateral leg cramping/pain. EXAM: CT LUMBAR SPINE WITHOUT CONTRAST TECHNIQUE: Multidetector CT imaging of the lumbar spine was performed without intravenous contrast administration. Multiplanar CT image reconstructions were also generated. COMPARISON:  08/09/2016 FINDINGS: Segmentation: 5 lumbar type vertebrae. Alignment: Normal. Vertebrae: No acute vertebral body fracture. Schmorl's node along the inferior endplate of L1. Severe endplate destruction along the inferior endplate of L2 and superior endplate of L3 with mild increased endplate destruction compared to the prior exam 08/09/2016. Intraspinal soft tissues are not fully imaged on this examination due to poor soft tissue contrast Osteoarthritis of bilateral sacroiliac joints. Paraspinal and other soft tissues: Paraspinal soft tissue edema likely reflecting mild phlegmonous changes. No drainable fluid collection. Bilateral renal atrophy. Nonobstructing right renal calculus. Abdominal aortic atherosclerosis. Disc levels: Degenerative disc disease with disc height loss at L1-2 and L5-S1. Broad-based  disc osteophyte complex at L5-S1 with moderate bilateral facet arthropathy. Bilateral facet arthropathy throughout the lumbar spine. Spinal stenosis at L2-3. Upper chest: Lung apices are clear. Other: No fluid collection or hematoma. IMPRESSION: 1. Severe endplate destruction along the cyst inferior endplate of L2 and superior endplate of L3 with mild increased destruction compared to the prior exam of 08/09/2016 consistent with discitis-osteomyelitis with mild paraspinal phlegmonous changes. No drainable paraspinal abscess. Intraspinal soft tissues are not fully imaged on this examination due to poor soft tissue contrast Electronically Signed   By: Kathreen Devoid   On: 11/05/2016 14:27   Dg Foot 2 Views Right  Result Date: 11/29/2016 CLINICAL DATA:  Osteomyelitis.  No additional history provided. EXAM: RIGHT FOOT - 2 VIEW COMPARISON:  Calcaneal radiographs  01/21/2016 FINDINGS: Post prior resection of the fourth toe at the proximal phalanx. Resection margins appear smooth. Advanced chronic change about the midfoot with loss of normal alignment, multifocal cystic change in some proliferation. No frank bony destruction. Mild smooth periosteal thickening of the second through fourth metatarsals has a chronic appearance. Previous skin defect about the heel is less prevalent radiographically. Adjacent linear density in the heel in the region of previous skin defect is unchanged, has similar density to adjacent vascular calcifications. Diffuse bony under mineralization. There are dense vascular calcifications. No radiopaque foreign body. IMPRESSION: 1. No radiographic findings to suggest acute osteomyelitis. 2. Chronic change about the midfoot which may be a osteoarthritis or Charcot foot. No prior exams available for comparison. Electronically Signed   By: Jeb Levering M.D.   On: 11/29/2016 19:28    echocardiogram       Filed Weights   11/30/16 0644 11/30/16 0951 12/03/16 0015  Weight: 66.8 kg (147 lb 4.3 oz) 65.4 kg (144 lb 2.9 oz) 67 kg (147 lb 11.3 oz)     Microbiology: Recent Results (from the past 240 hour(s))  Body fluid culture     Status: None (Preliminary result)   Collection Time: 12/01/16  8:50 AM  Result Value Ref Range Status   Specimen Description FLUID  Final   Special Requests L2 3 DISC ASPIRATE SENT 2 SYRINGES  Final   Gram Stain   Final    RARE WBC PRESENT, PREDOMINANTLY MONONUCLEAR NO ORGANISMS SEEN    Culture NO GROWTH 1 DAY  Final   Report Status PENDING  Incomplete       Blood Culture    Component Value Date/Time   SDES FLUID 12/01/2016 0850   SPECREQUEST L2 3 DISC ASPIRATE SENT 2 SYRINGES 12/01/2016 0850   CULT NO GROWTH 1 DAY 12/01/2016 0850   REPTSTATUS PENDING 12/01/2016 0850      Labs: Results for orders placed or performed during the hospital encounter of 11/28/16 (from the past 48 hour(s))  Body  fluid culture     Status: None (Preliminary result)   Collection Time: 12/01/16  8:50 AM  Result Value Ref Range   Specimen Description FLUID    Special Requests L2 3 DISC ASPIRATE SENT 2 SYRINGES    Gram Stain      RARE WBC PRESENT, PREDOMINANTLY MONONUCLEAR NO ORGANISMS SEEN    Culture NO GROWTH 1 DAY    Report Status PENDING   Glucose, capillary     Status: None   Collection Time: 12/01/16 11:22 AM  Result Value Ref Range   Glucose-Capillary 93 65 - 99 mg/dL  Glucose, capillary     Status: Abnormal   Collection Time: 12/01/16  4:34 PM  Result  Value Ref Range   Glucose-Capillary 207 (H) 65 - 99 mg/dL  Glucose, capillary     Status: Abnormal   Collection Time: 12/01/16  9:53 PM  Result Value Ref Range   Glucose-Capillary 140 (H) 65 - 99 mg/dL  Protime-INR     Status: None   Collection Time: 12/02/16  2:29 AM  Result Value Ref Range   Prothrombin Time 14.9 11.4 - 15.2 seconds   INR 6.73   Basic metabolic panel     Status: Abnormal   Collection Time: 12/02/16  2:29 AM  Result Value Ref Range   Sodium 132 (L) 135 - 145 mmol/L   Potassium 4.0 3.5 - 5.1 mmol/L   Chloride 96 (L) 101 - 111 mmol/L   CO2 25 22 - 32 mmol/L   Glucose, Bld 150 (H) 65 - 99 mg/dL   BUN 27 (H) 6 - 20 mg/dL   Creatinine, Ser 4.81 (H) 0.44 - 1.00 mg/dL   Calcium 8.3 (L) 8.9 - 10.3 mg/dL   GFR calc non Af Amer 8 (L) >60 mL/min   GFR calc Af Amer 9 (L) >60 mL/min    Comment: (NOTE) The eGFR has been calculated using the CKD EPI equation. This calculation has not been validated in all clinical situations. eGFR's persistently <60 mL/min signify possible Chronic Kidney Disease.    Anion gap 11 5 - 15  Heparin level (unfractionated)     Status: Abnormal   Collection Time: 12/02/16  2:29 AM  Result Value Ref Range   Heparin Unfractionated 0.23 (L) 0.30 - 0.70 IU/mL    Comment:        IF HEPARIN RESULTS ARE BELOW EXPECTED VALUES, AND PATIENT DOSAGE HAS BEEN CONFIRMED, SUGGEST FOLLOW UP TESTING OF  ANTITHROMBIN III LEVELS.   CBC     Status: Abnormal   Collection Time: 12/02/16  2:29 AM  Result Value Ref Range   WBC 7.9 4.0 - 10.5 K/uL   RBC 3.79 (L) 3.87 - 5.11 MIL/uL   Hemoglobin 11.3 (L) 12.0 - 15.0 g/dL   HCT 35.6 (L) 36.0 - 46.0 %   MCV 93.9 78.0 - 100.0 fL   MCH 29.8 26.0 - 34.0 pg   MCHC 31.7 30.0 - 36.0 g/dL   RDW 17.1 (H) 11.5 - 15.5 %   Platelets 259 150 - 400 K/uL  Glucose, capillary     Status: Abnormal   Collection Time: 12/02/16  6:41 AM  Result Value Ref Range   Glucose-Capillary 128 (H) 65 - 99 mg/dL   Comment 1 Notify RN    Comment 2 Document in Chart   Heparin level (unfractionated)     Status: None   Collection Time: 12/02/16  8:44 AM  Result Value Ref Range   Heparin Unfractionated 0.37 0.30 - 0.70 IU/mL    Comment:        IF HEPARIN RESULTS ARE BELOW EXPECTED VALUES, AND PATIENT DOSAGE HAS BEEN CONFIRMED, SUGGEST FOLLOW UP TESTING OF ANTITHROMBIN III LEVELS.   Glucose, capillary     Status: Abnormal   Collection Time: 12/02/16 10:51 AM  Result Value Ref Range   Glucose-Capillary 102 (H) 65 - 99 mg/dL  Glucose, capillary     Status: Abnormal   Collection Time: 12/02/16  4:37 PM  Result Value Ref Range   Glucose-Capillary 188 (H) 65 - 99 mg/dL   Comment 1 Notify RN    Comment 2 Document in Chart   Renal function panel     Status: Abnormal  Collection Time: 12/02/16  8:03 PM  Result Value Ref Range   Sodium 134 (L) 135 - 145 mmol/L   Potassium 4.3 3.5 - 5.1 mmol/L   Chloride 96 (L) 101 - 111 mmol/L   CO2 26 22 - 32 mmol/L   Glucose, Bld 183 (H) 65 - 99 mg/dL   BUN 39 (H) 6 - 20 mg/dL   Creatinine, Ser 5.40 (H) 0.44 - 1.00 mg/dL   Calcium 8.4 (L) 8.9 - 10.3 mg/dL   Phosphorus 2.7 2.5 - 4.6 mg/dL   Albumin 2.5 (L) 3.5 - 5.0 g/dL   GFR calc non Af Amer 7 (L) >60 mL/min   GFR calc Af Amer 8 (L) >60 mL/min    Comment: (NOTE) The eGFR has been calculated using the CKD EPI equation. This calculation has not been validated in all clinical  situations. eGFR's persistently <60 mL/min signify possible Chronic Kidney Disease.    Anion gap 12 5 - 15  CBC     Status: Abnormal   Collection Time: 12/02/16  8:03 PM  Result Value Ref Range   WBC 7.2 4.0 - 10.5 K/uL   RBC 3.69 (L) 3.87 - 5.11 MIL/uL   Hemoglobin 10.7 (L) 12.0 - 15.0 g/dL   HCT 34.8 (L) 36.0 - 46.0 %   MCV 94.3 78.0 - 100.0 fL   MCH 29.0 26.0 - 34.0 pg   MCHC 30.7 30.0 - 36.0 g/dL   RDW 17.1 (H) 11.5 - 15.5 %   Platelets 250 150 - 400 K/uL  Glucose, capillary     Status: Abnormal   Collection Time: 12/02/16  9:07 PM  Result Value Ref Range   Glucose-Capillary 179 (H) 65 - 99 mg/dL   Comment 1 Notify RN    Comment 2 Document in Chart   Heparin level (unfractionated)     Status: Abnormal   Collection Time: 12/03/16  5:18 AM  Result Value Ref Range   Heparin Unfractionated 1.68 (H) 0.30 - 0.70 IU/mL    Comment: RESULTS CONFIRMED BY MANUAL DILUTION        IF HEPARIN RESULTS ARE BELOW EXPECTED VALUES, AND PATIENT DOSAGE HAS BEEN CONFIRMED, SUGGEST FOLLOW UP TESTING OF ANTITHROMBIN III LEVELS.   CBC     Status: Abnormal   Collection Time: 12/03/16  5:18 AM  Result Value Ref Range   WBC 7.9 4.0 - 10.5 K/uL   RBC 3.83 (L) 3.87 - 5.11 MIL/uL   Hemoglobin 11.3 (L) 12.0 - 15.0 g/dL   HCT 35.9 (L) 36.0 - 46.0 %   MCV 93.7 78.0 - 100.0 fL   MCH 29.5 26.0 - 34.0 pg   MCHC 31.5 30.0 - 36.0 g/dL   RDW 17.1 (H) 11.5 - 15.5 %   Platelets 242 150 - 400 K/uL  Comprehensive metabolic panel     Status: Abnormal   Collection Time: 12/03/16  5:18 AM  Result Value Ref Range   Sodium 131 (L) 135 - 145 mmol/L   Potassium 3.8 3.5 - 5.1 mmol/L   Chloride 95 (L) 101 - 111 mmol/L   CO2 26 22 - 32 mmol/L   Glucose, Bld 174 (H) 65 - 99 mg/dL   BUN 19 6 - 20 mg/dL   Creatinine, Ser 3.24 (H) 0.44 - 1.00 mg/dL    Comment: CRITICAL VALUE NOTED.  VALUE IS CONSISTENT WITH PREVIOUSLY REPORTED AND CALLED VALUE.   Calcium 8.2 (L) 8.9 - 10.3 mg/dL   Total Protein 6.4 (L) 6.5 - 8.1  g/dL  Albumin 2.8 (L) 3.5 - 5.0 g/dL   AST 20 15 - 41 U/L   ALT 12 (L) 14 - 54 U/L   Alkaline Phosphatase 73 38 - 126 U/L   Total Bilirubin 0.6 0.3 - 1.2 mg/dL   GFR calc non Af Amer 12 (L) >60 mL/min   GFR calc Af Amer 14 (L) >60 mL/min    Comment: (NOTE) The eGFR has been calculated using the CKD EPI equation. This calculation has not been validated in all clinical situations. eGFR's persistently <60 mL/min signify possible Chronic Kidney Disease.    Anion gap 10 5 - 15  Protime-INR     Status: Abnormal   Collection Time: 12/03/16  5:18 AM  Result Value Ref Range   Prothrombin Time 17.0 (H) 11.4 - 15.2 seconds   INR 1.37   Glucose, capillary     Status: Abnormal   Collection Time: 12/03/16  6:27 AM  Result Value Ref Range   Glucose-Capillary 186 (H) 65 - 99 mg/dL   Comment 1 Notify RN    Comment 2 Document in Chart      Lipid Panel     Component Value Date/Time   CHOL 125 06/19/2015 0616   CHOL 119 06/20/2013 0540   TRIG 144 06/19/2015 0616   TRIG 95 06/20/2013 0540   HDL 35 (L) 06/19/2015 0616   HDL 57 06/20/2013 0540   CHOLHDL 3.6 06/19/2015 0616   VLDL 29 06/19/2015 0616   VLDL 19 06/20/2013 0540   LDLCALC 61 06/19/2015 0616   LDLCALC 43 06/20/2013 0540     Lab Results  Component Value Date   HGBA1C 5.5 11/30/2016   HGBA1C 5.1 08/10/2016   HGBA1C 6.6 (H) 06/19/2015      HPI :  Natalie Kennebrew Hopkinsis a 83 y.o.femalewith medical history significant of ESRD dialysis on MWF, A.Fib on coumadin, HTN. Patient is being treated for diskitis / osteomyelitis of L2-L3 with Vancomycin with dialysis. Aspirate in March demonstrated GPC in pairs on GS but cultures without growth. Despite treatment, her back pain has worsened and she presented to the ED at [facility].   CT reveals worsening L2-L3 discitis/ osteomyelitis with severe canal stenosis.  She was seen by ID on 6/29.   HOSPITAL COURSE:   Discitis and Osteomyelitis of lumbar spine  worsening L2-L3 discitis/  osteomyelitis with severe canal stenosis on CT on 6/29 consulted  ID -  previously on Vanc, now started on daptomycin, follow-up cultures of lumbar aspirate, Gram stain continue to be negative, continue daptomycin with hemodialysis for 6 weeks as per Dr. Megan Salon Status post L2-L3 disc aspiration on 12/01/16 needs to wear back brace which she has been resistant to wear- Patient encouraged to wear a TLSO brace Continue Tramadol, Oxycodone, Neurontin dose increased to 300 mg qhs ,added oxycontin ID Dr Drucilla Schmidt  had ordered ankle-brachial index study on 11/29/16, which was abnormal,Right TBI of 0.14 and left TBI of 0.36 is suggestive of abnormal arterial flow at rest, Unable to obtain bilateral ABI's due to non-compressible arteries likely due to medial calcification Patient will continue with IV daptomycin for 6 weeks and follow-up with Dr. Ola Spurr in [facility]       Chronic atrial fibrillation  - CHA2DS2-VASc Score at least 6 - Coumadin held for aspiration of disc space- placed on iv Heparin until INR therapeutic  high risk for CVA - cont Cardizem  INR still subtherapeutic, will switch to Lovenox for bridging and continue Lovenox until INR greater than 2.0   Anticoagulation  management - INR  subtherapeutic at the time of discharge, patient started on Lovenox -  given Vit K 2 mg given on 6/30    ESRD on dialysis   - MWF dialysis - appreciate nephrology follow up  Early cirrhosis - noted on CT 6/29  Right middle lobe pulm nodules - stable from last imaging 13 mo ago- f/u Ct in 1 yr  dCHF- chronic Patient was on lasix 80 BID as outpt - does not make urine and therefore discontinued -  mod conc hypertrophy on last ECHO 1/17    Diabetes mellitus 2 Resume Levemir  Hypokalemia - resolved  Hyponatremia - correct with dialysis / per nephro  Right sciatic nerve stimulator     Discharge Exam:   Blood pressure (!) 149/63, pulse 99, temperature 97.9 F (36.6 C),  temperature source Oral, resp. rate 16, height 5' 5" (1.651 m), weight 67 kg (147 lb 11.3 oz), SpO2 97 %.  General exam: Appears comfortable today HEENT: PERRLA, oral mucosa moist, no sclera icterus or thrush Respiratory system: Clear to auscultation. Respiratory effort normal. Cardiovascular system: S1 & S2 heard, RRR.  No murmurs  Gastrointestinal system: Abdomen soft, non-tender, nondistended. Normal bowel sound. No organomegaly Central nervous system: Alert and oriented. No focal neurological deficits. Extremities: No cyanosis, clubbing or edema MSK- tenderness in lower back noted.  Skin: No rashes or ulcers Psychiatry:  Mood & affect appropriate.     Follow-up Information    Hortencia Pilar, MD. Call.   Specialty:  Family Medicine Why:  Hospital follow-up in 3-5 days Contact information: [place] [address] [phone]           Signed: Reyne Dumas 12/03/2016, 8:32 AM        Time spent >1 hour

## 2016-12-03 NOTE — NC FL2 (Signed)
[place] MEDICAID FL2 LEVEL OF CARE SCREENING TOOL     IDENTIFICATION  Patient Name: [name] Birthdate: 08/23/1933 Sex: female Admission Date (Current Location): 11/28/2016  [facility] [place]   Facility and Address:  [facility] [place], [address], [place], [place] [address]      Provider Number: [id number]  Attending Physician Name and Address:  Richarda OverlieAbrol, Nayana, MD  Relative Name and Phone Number:       Current Level of Care: Hospital Recommended Level of Care: Skilled Nursing Facility Prior Approval Number:    Date Approved/Denied:   PASRR Number: [id number]  Discharge Plan: SNF    Current Diagnoses: Patient Active Problem List   Diagnosis Date Noted  . Acute osteomyelitis of right calcaneus (HCC)   . Diabetic ulcer of right heel associated with diabetes mellitus due to underlying condition, with necrosis of bone (HCC)   . Osteomyelitis of lumbar spine (HCC) 11/28/2016  . Palliative care by specialist   . Goals of care, counseling/discussion   . Lower back pain 08/17/2016  . Hyponatremia 08/17/2016  . Hyperkalemia 08/17/2016  . Heel ulcer (HCC) 08/10/2016  . Discitis 08/09/2016  . Atherosclerosis of native arteries of the extremities with ulceration (HCC) 04/14/2016  . Kidney dialysis as the cause of abnormal reaction of the patient, or of later complication, without mention of misadventure at the time of the procedure (CODE) 03/03/2016  . Pressure injury of skin 02/18/2016  . Osteomyelitis (HCC) 02/14/2016  . Hematoma 10/12/2015  . Leg pain, right 09/14/2015  . PVD (peripheral vascular disease) with claudication (HCC) 09/14/2015  . Pneumonia 08/13/2015  . Carotid stenosis 07/26/2015  . Amputation of fifth toe, left, traumatic (HCC) 06/24/2015  . Diabetes mellitus (HCC) 06/23/2015  . Diabetic neuropathy (HCC) 06/23/2015  . Chronic atrial fibrillation (HCC) 06/23/2015  . Left carotid artery stenosis 06/23/2015   . Cerebral infarction (HCC) 06/21/2015  . Pressure ulcer 06/19/2015  . ESRD on dialysis (HCC) 06/18/2015  . Respiratory distress 12/11/2014    Orientation RESPIRATION BLADDER Height & Weight     Self, Time, Situation, Place  Normal Incontinent Weight: 147 lb 11.3 oz (67 kg) Height:  5\' 5"  (165.1 cm)  BEHAVIORAL SYMPTOMS/MOOD NEUROLOGICAL BOWEL NUTRITION STATUS      Continent Diet (carb modified)  AMBULATORY STATUS COMMUNICATION OF NEEDS Skin   Extensive Assist Verbally PU Stage and Appropriate Care, Surgical wounds, Skin abrasions   PU Stage 2 Dressing:  (every 3 days)                   Personal Care Assistance Level of Assistance  Bathing, Dressing Bathing Assistance: Maximum assistance   Dressing Assistance: Maximum assistance     Functional Limitations Info             SPECIAL CARE FACTORS FREQUENCY  PT (By licensed PT), OT (By licensed OT)     PT Frequency: 5x/wk OT Frequency: 5x/wk            Contractures      Additional Factors Info  Code Status, Allergies, Psychotropic, Insulin Sliding Scale, Isolation Precautions Code Status Info: DNR Allergies Info: Ceftazidime, Codeine, Contrast Media Iodinated Diagnostic Agents, Oxycodone, Quinine Derivatives, Tape Psychotropic Info: Paxil 40mg  Insulin Sliding Scale Info: 3x/day Isolation Precautions Info: Contact precautions, VRE     Current Medications (12/03/2016):  This is the current hospital active medication list Current Facility-Administered Medications  Medication Dose Route Frequency Provider Last Rate Last Dose  . acetaminophen (  TYLENOL) tablet 650 mg  650 mg Oral QID Hillary Bow, DO   650 mg at 12/02/16 2236  . albuterol (PROVENTIL) (2.5 MG/3ML) 0.083% nebulizer solution 3 mL  3 mL Inhalation Q6H PRN Hillary Bow, DO      . calcium acetate (PHOSLO) capsule 1,334 mg  1,334 mg Oral TID WC Lyda Perone M, DO   1,334 mg at 12/03/16 0800  . collagenase (SANTYL) ointment   Topical Daily  Calvert Cantor, MD   1 application at 12/02/16 1051  . cyclobenzaprine (FLEXERIL) tablet 5 mg  5 mg Oral TID PRN Hillary Bow, DO   5 mg at 12/02/16 1051  . DAPTOmycin (CUBICIN) 500 mg in sodium chloride 0.9 % IVPB  500 mg Intravenous Q48H Cliffton Asters, MD   Stopped at 12/01/16 1813  . diltiazem (CARDIZEM CD) 24 hr capsule 120 mg  120 mg Oral QHS Lyda Perone M, DO   120 mg at 12/01/16 2138  . famotidine (PEPCID) tablet 20 mg  20 mg Oral QHS Lyda Perone M, DO   20 mg at 12/02/16 2236  . feeding supplement (ENSURE ENLIVE) (ENSURE ENLIVE) liquid 237 mL  237 mL Oral BID BM Daiva Eves, Lisette Grinder, MD   237 mL at 12/02/16 1445  . gabapentin (NEURONTIN) capsule 100 mg  100 mg Oral Once per day on Sun Tue Thu Sat Hillary Bow, DO   100 mg at 12/01/16 2147  . gabapentin (NEURONTIN) capsule 300 mg  300 mg Oral Once per day on Mon Wed Fri Julian Reil, Jared M, DO   300 mg at 12/02/16 2245  . heparin ADULT infusion 100 units/mL (25000 units/227mL sodium chloride 0.45%)  1,100 Units/hr Intravenous Continuous Cherre Huger, RPH 11 mL/hr at 12/02/16 1559 1,100 Units/hr at 12/02/16 1559  . heparin injection 1,300 Units  20 Units/kg Dialysis PRN Terrial Rhodes, MD      . insulin aspart (novoLOG) injection 0-9 Units  0-9 Units Subcutaneous TID WC Calvert Cantor, MD   2 Units at 12/03/16 0710  . lidocaine (LIDODERM) 5 % 1 patch  1 patch Transdermal Q24H Lyda Perone M, DO   1 patch at 12/02/16 1000  . lidocaine-prilocaine (EMLA) cream 1 application  1 application Topical PRN Hillary Bow, DO      . multivitamin (RENA-VIT) tablet 1 tablet  1 tablet Oral Daily Hillary Bow, DO   1 tablet at 12/02/16 0934  . omega-3 acid ethyl esters (LOVAZA) capsule 1,000 mg  1,000 mg Oral Daily Lyda Perone M, DO   1,000 mg at 12/02/16 0935  . oxyCODONE (Oxy IR/ROXICODONE) immediate release tablet 5-10 mg  5-10 mg Oral Q4H PRN Hillary Bow, DO   10 mg at 12/02/16 1050  . PARoxetine (PAXIL) tablet 40 mg   40 mg Oral QHS Lyda Perone M, DO   40 mg at 12/02/16 2236  . polyethylene glycol (MIRALAX / GLYCOLAX) packet 17 g  17 g Oral PRN Hillary Bow, DO      . pravastatin (PRAVACHOL) tablet 20 mg  20 mg Oral QPM Lyda Perone M, DO   20 mg at 12/02/16 1729  . senna-docusate (Senokot-S) tablet 2 tablet  2 tablet Oral BID Hillary Bow, DO   2 tablet at 12/02/16 2236  . traMADol (ULTRAM) tablet 50 mg  50 mg Oral Q6H PRN Hillary Bow, DO   50 mg at 12/03/16 0800  . Warfarin - Pharmacist Dosing Inpatient   Does not apply [phone]  Calvert Cantor, MD         Discharge Medications: Please see discharge summary for a list of discharge medications.  Relevant Imaging Results:  Relevant Lab Results:   Additional Information SS#: [id number]  Baldemar Lenis, LCSW

## 2016-12-03 NOTE — Care Management Note (Signed)
Case Management Note  Patient Details  Name: [name] MRN: [id number] Date of Birth: Apr 10, 1934  Subjective/Objective:   Pt admitted with osteomyelitis of her spine. She is from [facility].                 Action/Plan: Plan is for her to return to [facility] when medically ready. CM following.   Expected Discharge Date:                  Expected Discharge Plan:  Skilled Nursing Facility  In-House Referral:  Clinical Social Work  Discharge planning Services     Post Acute Care Choice:    Choice offered to:     DME Arranged:    DME Agency:     HH Arranged:    HH Agency:     Status of Service:  In process, will continue to follow  If discussed at Long Length of Stay Meetings, dates discussed:    Additional Comments:  Kermit BaloKelli F Chavonne Sforza, RN 12/03/2016, 10:44 AM

## 2016-12-03 NOTE — Progress Notes (Addendum)
Patient ID: Helane RimaJoann Y Divito, female   DOB: 04-Sep-1933, 83 y.o.   MRN: [id number] S:complaining of severe back pain O:BP (!) 151/62 (BP Location: Left Arm)   Pulse (!) 102   Temp 97.7 F (36.5 C) (Oral)   Resp 16   Ht 5\' 5"  (1.651 m)   Wt 67 kg (147 lb 11.3 oz)   SpO2 100%   BMI 24.58 kg/m   Intake/Output Summary (Last 24 hours) at 12/03/16 1303 Last data filed at 12/03/16 0800  Gross per 24 hour  Intake           640.42 ml  Output             2000 ml  Net         -1359.58 ml   Intake/Output: I/O last 3 completed shifts: In: 407.1 [P.O.:60; I.V.:347.1] Out: 2000 [Other:2000]  Intake/Output this shift:  Total I/O In: 480 [P.O.:480] Out: -  Weight change:  Gen:NAD CVS:no rub Resp:cta NWG:NFAOZHAbd:benign Ext:no edema, RUE AVF +T/B, but pulsatile   Recent Labs Lab 11/27/16 1617 11/28/16 0814 11/29/16 0213 11/30/16 0304 12/01/16 0321 12/02/16 0229 12/02/16 2003 12/03/16 0518  NA 131* 130* 128* 127* 133* 132* 134* 131*  K 2.9* 3.5 3.8 3.8 3.7 4.0 4.3 3.8  CL 94* 97* 96* 94* 98* 96* 96* 95*  CO2 27 23 20* 21* 24 25 26 26   GLUCOSE 91 87 99 108* 110* 150* 183* 174*  BUN 13 16 24* 35* 17 27* 39* 19  CREATININE 2.26* 3.16* 3.94* 5.01* 3.64* 4.81* 5.40* 3.24*  ALBUMIN 3.2*  --   --   --   --   --  2.5* 2.8*  CALCIUM 9.0 8.4* 8.2* 8.1* 8.3* 8.3* 8.4* 8.2*  PHOS  --   --   --   --   --   --  2.7  --   AST 27  --   --   --   --   --   --  20  ALT 16  --   --   --   --   --   --  12*   Liver Function Tests:  Recent Labs Lab 11/27/16 1617 12/02/16 2003 12/03/16 0518  AST 27  --  20  ALT 16  --  12*  ALKPHOS 93  --  73  BILITOT 0.9  --  0.6  PROT 7.3  --  6.4*  ALBUMIN 3.2* 2.5* 2.8*    Recent Labs Lab 11/27/16 1617  LIPASE 18   No results for input(s): AMMONIA in the last 168 hours. CBC:  Recent Labs Lab 11/27/16 1617 12/02/16 0229 12/02/16 2003 12/03/16 0518  WBC 7.5 7.9 7.2 7.9  HGB 12.3 11.3* 10.7* 11.3*  HCT 36.8 35.6* 34.8* 35.9*  MCV 92.2 93.9 94.3  93.7  PLT 282 259 250 242   Cardiac Enzymes: No results for input(s): CKTOTAL, CKMB, CKMBINDEX, TROPONINI in the last 168 hours. CBG:  Recent Labs Lab 12/02/16 1051 12/02/16 1637 12/02/16 2107 12/03/16 0627 12/03/16 1202  GLUCAP 102* 188* 179* 186* 141*    Iron Studies: No results for input(s): IRON, TIBC, TRANSFERRIN, FERRITIN in the last 72 hours. Studies/Results: No results found. Marland Kitchen. acetaminophen  650 mg Oral QID  . calcium acetate  1,334 mg Oral TID WC  . collagenase   Topical Daily  . diltiazem  120 mg Oral QHS  . enoxaparin (LOVENOX) injection  1 mg/kg Subcutaneous Q24H  . famotidine  20 mg Oral QHS  .  feeding supplement (ENSURE ENLIVE)  237 mL Oral BID BM  . gabapentin  100 mg Oral Once per day on Sun Tue Thu Sat  . gabapentin  300 mg Oral Once per day on Mon Wed Fri  . insulin aspart  0-9 Units Subcutaneous TID WC  . lidocaine  1 patch Transdermal Q24H  . multivitamin  1 tablet Oral Daily  . omega-3 acid ethyl esters  1,000 mg Oral Daily  . PARoxetine  40 mg Oral QHS  . pravastatin  20 mg Oral QPM  . senna-docusate  2 tablet Oral BID  . Warfarin - Pharmacist Dosing Inpatient   Does not apply q1800    BMET    Component Value Date/Time   NA 131 (L) 12/03/2016 0518   NA 137 07/04/2014 1303   K 3.8 12/03/2016 0518   K 3.3 (L) 07/04/2014 1303   CL 95 (L) 12/03/2016 0518   CL 100 07/04/2014 1303   CO2 26 12/03/2016 0518   CO2 29 07/04/2014 1303   GLUCOSE 174 (H) 12/03/2016 0518   GLUCOSE 76 07/04/2014 1303   BUN 19 12/03/2016 0518   BUN 44 (H) 07/04/2014 1303   CREATININE 3.24 (H) 12/03/2016 0518   CREATININE 2.72 (H) 07/04/2014 1303   CALCIUM 8.2 (L) 12/03/2016 0518   CALCIUM 8.9 07/04/2014 1303   GFRNONAA 12 (L) 12/03/2016 0518   GFRNONAA 18 (L) 07/04/2014 1303   GFRNONAA 29 (L) 12/02/2013 0847   GFRAA 14 (L) 12/03/2016 0518   GFRAA 22 (L) 07/04/2014 1303   GFRAA 34 (L) 12/02/2013 0847   CBC    Component Value Date/Time   WBC 7.9 12/03/2016 0518    RBC 3.83 (L) 12/03/2016 0518   HGB 11.3 (L) 12/03/2016 0518   HGB 14.2 12/03/2013 0820   HCT 35.9 (L) 12/03/2016 0518   HCT 42.7 12/03/2013 0820   PLT 242 12/03/2016 0518   PLT 214 12/03/2013 0820   MCV 93.7 12/03/2016 0518   MCV 94 12/03/2013 0820   MCH 29.5 12/03/2016 0518   MCHC 31.5 12/03/2016 0518   RDW 17.1 (H) 12/03/2016 0518   RDW 17.1 (H) 12/03/2013 0820   LYMPHSABS 0.8 (L) 08/09/2016 2011   LYMPHSABS 1.1 12/03/2013 0820   MONOABS 0.7 08/09/2016 2011   MONOABS 0.9 12/03/2013 0820   EOSABS 0.2 08/09/2016 2011   EOSABS 0.2 12/03/2013 0820   BASOSABS 0.1 08/09/2016 2011   BASOSABS 0.1 12/03/2013 0820   Dialysis Access: RUA AVF Pos. Bruit   Dialysis Orders: Center: [facility] [address] . EDW 67kgHD Bath 2k, 2.5 ca Time 3hrs Heparin 2000 bolus with infusion hourly 400 . Access RUA AVF 15 g BFR 450DFR 800 Epogen 2200Units IV/HD Venofer 50mg  weekly Hd / NO vit d listed   Assessment/Plan: 1. Osteomyelitis/diskitis of L2-3, cultures negative to date from aspirate on 12/01/16, ID following and recommending IV daptomycin after HD 2. ESRD- continue on MWF schedule (outpatient at [facility]).  Challenging edw due to ^ BP 3. HTN/volume- stable 4. Chronic A fib- on coumadin 5. DM- per primary 6. Anemia of chronic kidney disease- ESA on hold due to Hgb >12 but now down to 11.3, will restart if cont to drop. 7. Disposition- per primary, currently on hold due to pain.  Another issue before discharge is that her dialysis unit, [facility] on [address] does not have daptomycin on formulary and they have to special order it. Also she will need to be able to sit in a  chair for HD.    Irena Cords, MD [employer] [phone]

## 2016-12-03 NOTE — Progress Notes (Signed)
CSW following to facilitate discharge. CSW contacted patient's daughter and POA, [name], to discuss discharge. Patient's daughter concerned about patient being able to return to outpatient dialysis because pain was not controlled. Patient's daughter explained that the reason the patient came to the hospital was due to being unable to tolerate the pain while sitting at dialysis.   CSW consulted clinical social worker on renal floor to discuss options. CSW confirmed that patient will need to have pain controlled prior to discharge from hospital in order to return to SNF and handle outpatient dialysis.  CSW text paged information to alert MD. CSW will follow to facilitate discharge when patient will be able to successfully return to SNF and handle outpatient dialysis.  Natalie NicelyElizabeth Sarafina [name], [place] Clinical Social Worker [phone]

## 2016-12-03 NOTE — Progress Notes (Signed)
OT Cancellation Note and Discharge  Patient Details Name: [name] MRN: [id number] DOB: February 25, 1934   Cancelled Treatment:    Reason Eval/Treat Not Completed: Other (comment) (Defer evaluation to SNF). Pt is from SNF and has discharge orders to return to SNF. Evaluation is more appropriate for SNF to perform as it is her home environment. Pt will require SNF level therapy to return to PLOF and to maximize independence and safety in ADL. Thank you for this referral.  Emelda FearLaura J Conor Lata 12/03/2016, 11:16 AM  Sherryl MangesLaura Marvette Schamp OTR/L [phone]

## 2016-12-03 NOTE — Progress Notes (Signed)
Physical Therapy Treatment Patient Details Name: Natalie RimaJoann Y Ailey MRN: [id number] DOB: 02-14-1934 Today's Date: 12/03/2016    History of Present Illness  [name] is a 83 y.o. female with medical history significant of ESRD dialysis on MWF, A.Fib on coumadin, HTN.  Patient has recently been battling diskitis / osteomyelitis of L2-L3.  Aspirate in March demonstrated GPC in pairs on GS but cultures without growth.  She was put on vancomycin with dialysis.  Despite this her back pain has worsened and she [facility].  Work up there includes CT abd/pelvis which demonstrates worsening of her osteomyelitis.    PT Comments    Pt was not able to get up to the chair today as she is significantly limited by left hip pain EOB and sat < 5 mins before starting to cry and adamantly  wanting to return to supine.  I told her we may get relief in standing because that hip is not bent, but she refused to try and I could not get her back brace donned quickly enough to try. She does not like her new TLSO that was ordered and wants to know if she can use one of her softer braces from home.  MD will have to advise.    Follow Up Recommendations  SNF     Equipment Recommendations  None recommended by PT    Recommendations for Other Services   NA     Precautions / Restrictions Precautions Precautions: Fall;Back Required Braces or Orthoses: Spinal Brace Spinal Brace: Thoracolumbosacral orthotic;Applied in sitting position;Other (comment) ("only when ambulating" re: order) Spinal Brace Comments: Pt doesn't want to use that back brace, she says it hurts.  Restrictions Weight Bearing Restrictions: No    Mobility  Bed Mobility Overal bed mobility: Needs Assistance Bed Mobility: Rolling;Sidelying to Sit Rolling: Min assist Sidelying to sit: Mod assist     Sit to sidelying: Mod assist General bed mobility comments: Min assist to help pt roll completely to her right side,  mod assist for transitions at trunk mostly.   Transfers                 General transfer comment: unable as pt was in too much pain in sitting while I was trying to donn her brace and started crying that her left hip hurt too much to continue.  She was pre medicated earlier this AM.          Balance Overall balance assessment: Needs assistance Sitting-balance support: Feet supported;Bilateral upper extremity supported Sitting balance-Leahy Scale: Fair Sitting balance - Comments: supervision EOB Postural control: Posterior lean                                  Cognition Arousal/Alertness: Awake/alert Behavior During Therapy: WFL for tasks assessed/performed Overall Cognitive Status: Within Functional Limits for tasks assessed (not specifically tested)                                               Pertinent Vitals/Pain Pain Assessment: Faces Faces Pain Scale: Hurts worst Pain Location: back and left hip seated EOB, ok at rest supine Pain Descriptors / Indicators: Crying;Grimacing;Guarding Pain Intervention(s): Limited activity within patient's tolerance;Monitored during session;Repositioned;Heat applied;Premedicated before session (heat applied to left hip and thigh)  PT Goals (current goals can now be found in the care plan section) Acute Rehab PT Goals Patient Stated Goal: decrease left hip/leg pain Progress towards PT goals: Not progressing toward goals - comment (limited by pain today)    Frequency    Min 3X/week      PT Plan Current plan remains appropriate       AM-PAC PT "6 Clicks" Daily Activity  Outcome Measure  Difficulty turning over in bed (including adjusting bedclothes, sheets and blankets)?: Total Difficulty moving from lying on back to sitting on the side of the bed? : Total Difficulty sitting down on and standing up from a chair with arms (e.g., wheelchair, bedside commode, etc,.)?: Total Help  needed moving to and from a bed to chair (including a wheelchair)?: Total Help needed walking in hospital room?: Total Help needed climbing 3-5 steps with a railing? : Total 6 Click Score: 6    End of Session   Activity Tolerance: Patient limited by pain Patient left: in bed;with call bell/phone within reach;with bed alarm set Nurse Communication: Mobility status PT Visit Diagnosis: Muscle weakness (generalized) (M62.81);Unsteadiness on feet (R26.81);Other abnormalities of gait and mobility (R26.89);Pain Pain - Right/Left: Left Pain - part of body: Hip (low back)     Time: 4098-1191 PT Time Calculation (min) (ACUTE ONLY): 14 min  Charges:  $Therapeutic Activity: 8-22 mins          Yuriel Lopezmartinez B. Sevannah Madia, PT, DPT [phone]            12/03/2016, 10:21 AM

## 2016-12-03 NOTE — Progress Notes (Signed)
Physical Therapy Treatment Patient Details Name: Natalie RimaJoann Y Bayus MRN: [id number] DOB: [birth year] Today's Date: 12/03/2016    History of Present Illness  [name] is a 83 y.o. female with medical history significant of ESRD dialysis on MWF, A.Fib on coumadin, HTN.  Patient has recently been battling diskitis / osteomyelitis of L2-L3.  Aspirate in March demonstrated GPC in pairs on GS but cultures without growth.  She was put on vancomycin with dialysis.  Despite this her back pain has worsened and she presented [facility] the ED [facility] today.  Work up there includes CT abd/pelvis which demonstrates worsening of her osteomyelitis.    PT Comments    RN asked PT to re-attempt OOB mobility as pt has now been pre medicated.  Pt was able to get up, but was positioned nearly completely reclined in the chair turned onto her left side.  I am not sure, even like this, that she would be able to tolerate this for 3 hours not to mention sitting very upright for the ride in a WC on the van to HD.  PT will continue to follow acutely to assess.   Follow Up Recommendations  SNF     Equipment Recommendations  None recommended by PT    Recommendations for Other Services   NA     Precautions / Restrictions Precautions Precautions: Fall;Back Required Braces or Orthoses: Spinal Brace Spinal Brace: Thoracolumbosacral orthotic;Applied in sitting position;Other (comment) ("only when ambulating" re: order) Spinal Brace Comments: Pt does not want to use back brace ordered by physician, so we used her lumbar corset from home.     Mobility  Bed Mobility Overal bed mobility: Needs Assistance Bed Mobility: Rolling;Sidelying to Sit Rolling: Min assist Sidelying to sit: Mod assist       General bed mobility comments: Min assist to help pt roll completely to her right side, mod assist for transitions at trunk mostly.   Verbal cues for log roll technique to protect her back and limit  pain  Transfers Overall transfer level: Needs assistance Equipment used: 2 person hand held assist Transfers: Sit to/from Stand Sit to Stand: +2 physical assistance;Mod assist         General transfer comment: Two person mod assist to stand with bil hand held assist and take 5-6 pivotal steps with hand held assist around to the chair, posterior preference throughout over buckling weak legs.    Ambulation/Gait Ambulation/Gait assistance: +2 physical assistance;Mod assist Ambulation Distance (Feet): 5 Feet Assistive device: 2 person hand held assist Gait Pattern/deviations: Step-to pattern;Leaning posteriorly (buckling legs)     General Gait Details: 5-6 pivotal steps to the chair with bil HHA.  Cues to lean forward over her feet, not to sit before she can feel the chair behind her.           Balance Overall balance assessment: Needs assistance Sitting-balance support: Feet supported;Bilateral upper extremity supported Sitting balance-Leahy Scale: Fair Sitting balance - Comments: supervision EOB Postural control: Posterior lean Standing balance support: Bilateral upper extremity supported Standing balance-Leahy Scale: Poor Standing balance comment: two person mod assist                            Cognition Arousal/Alertness: Awake/alert Behavior During Therapy: WFL for tasks assessed/performed Overall Cognitive Status: Within Functional Limits for tasks assessed (not specifically tested)  General Comments General comments (skin integrity, edema, etc.): Pt positioned in recliner chair on her right side with left knee flexed with pillow.  She has very poor tolerance of sitting, but is willing to try if we recline her back in the chair, apply heat to her left hip and essentially sit sideways.  Not sure if the chairs at HD can do this and pt would have a hard time sitting straight up in a WC to transport  there in a van.        Pertinent Vitals/Pain Pain Assessment: Faces Faces Pain Scale: Hurts whole lot Pain Location: back and left hip seated EOB, ok at rest supine Pain Descriptors / Indicators: Grimacing;Guarding Pain Intervention(s): Limited activity within patient's tolerance;Monitored during session;Repositioned;Premedicated before session           PT Goals (current goals can now be found in the care plan section) Acute Rehab PT Goals Patient Stated Goal: decrease left hip/leg pain Progress towards PT goals: Progressing toward goals    Frequency    Min 3X/week      PT Plan Current plan remains appropriate       AM-PAC PT "6 Clicks" Daily Activity  Outcome Measure  Difficulty turning over in bed (including adjusting bedclothes, sheets and blankets)?: Total Difficulty moving from lying on back to sitting on the side of the bed? : Total Difficulty sitting down on and standing up from a chair with arms (e.g., wheelchair, bedside commode, etc,.)?: Total Help needed moving to and from a bed to chair (including a wheelchair)?: A Lot Help needed walking in hospital room?: A Lot Help needed climbing 3-5 steps with a railing? : Total 6 Click Score: 8    End of Session Equipment Utilized During Treatment: Back brace Activity Tolerance: Patient limited by pain Patient left: in chair;with chair alarm set;with call bell/phone within reach Nurse Communication: Mobility status PT Visit Diagnosis: Muscle weakness (generalized) (M62.81);Unsteadiness on feet (R26.81);Other abnormalities of gait and mobility (R26.89);Pain Pain - Right/Left: Left Pain - part of body: Hip (low back)     Time: 1610-9604 PT Time Calculation (min) (ACUTE ONLY): 16 min  Charges:  $Therapeutic Activity: 8-22 mins          Deirdre Gryder B. Genie Mirabal, PT, DPT [phone]            12/03/2016, 5:23 PM

## 2016-12-03 NOTE — Progress Notes (Signed)
ANTICOAGULATION CONSULT NOTE - Follow Up Consult  Pharmacy Consult for LMWH/Coumadin Indication: atrial fibrillation  Allergies  Allergen Reactions  . Ceftazidime Other (See Comments)    myoclonus  . Codeine Itching and Other (See Comments)    Reaction:  Confusion/Hallucinations   . Contrast Media [Iodinated Diagnostic Agents] Other (See Comments)    "skin peel" breaks into a blistered rash, then peel. If given Benadryl and solumedrol she will be okay  . Oxycodone Other (See Comments)    Reaction:  Confusion/Hallucinations   . Quinine Derivatives Rash  . Tape Other (See Comments)    Skin is very thin and will blister and pull off skin with any tape except paper tape.  PLEASE USE PAPER TAPE.    Patient Measurements: Height: 5\' 5"  (165.1 cm) Weight: 147 lb 11.3 oz (67 kg) IBW/kg (Calculated) : 57 Heparin Dosing Weight:  65.4 kg  Vital Signs: Temp: 97.9 F (36.6 C) (07/05 0546) Temp Source: Oral (07/05 0546) BP: 149/63 (07/05 0546) Pulse Rate: 99 (07/05 0546)  Labs:  Recent Labs  12/01/16 0321  12/02/16 0229 12/02/16 0844 12/02/16 2003 12/03/16 0518 12/03/16 0754  HGB  --   < > 11.3*  --  10.7* 11.3*  --   HCT  --   --  35.6*  --  34.8* 35.9*  --   PLT  --   --  259  --  250 242  --   LABPROT 16.3*  --  14.9  --   --  17.0*  --   INR 1.31  --  1.17  --   --  1.37  --   HEPARINUNFRC  --   < > 0.23* 0.37  --  1.68* 0.32  CREATININE 3.64*  --  4.81*  --  5.40* 3.24*  --   < > = values in this interval not displayed.  Estimated Creatinine Clearance: 12.3 mL/min (A) (by C-G formula based on SCr of 3.24 mg/dL (H)).  Assessment: Warf pta for afib was on hold for lumbar spine aspiration 7/3. INR 1.37 this AM. Hgb 11.3, Plts 242. Watch post post lumbar spine aspiration procedure. CHA2DS2-VASc Score at least 6. HL this am was high initially (potentially drawn incorrectly?); stat repeat level consistent with previous results. Patient is being discharged, so pharmacy now  consulted to switch from heparin to LMWH. As patient is ESRD, will use renal dose. Would check anti-Xa in this patient periodically until INR is therapeutic x2. No bleeding noted.   - Coumadin pta: 2.5 mg daily (admit INR 3.45)   Goal of Therapy:  Anti-Xa level 0.6-1 units/ml 4hrs after LMWH dose given  INR 2-3 Monitor platelets by anticoagulation protocol: Yes   Plan:  Stop heparin  Start LMWH 1 mg/kg 1 hour from stopping heparin Coumadin 4mg  po x 1 now Daily CBC, INR  Alfredo BachJoseph Susanne Baumgarner, BS, PharmD Clinical Pharmacy Resident [phone]) 12/03/2016 9:58 AM

## 2016-12-03 NOTE — Progress Notes (Signed)
Pt taken for Dialysis. [name], [name] Gorley Efe

## 2016-12-03 NOTE — Progress Notes (Signed)
Pt back from dialysis, alert and oriented, settled in bed, will continue to monitor. Obasogie-Asidi, Joushua Dugar Efe

## 2016-12-03 NOTE — Progress Notes (Signed)
Patient ID: [name], female   DOB: 28-Aug-1933, 83 y.o.   MRN: [id number]          [facility]    Date of Admission:  11/28/2016   Day 2 daptomycin         No organisms were seen on her lumbar aspirate Gram stain. Her culture remains negative at 48 hours. It is highly likely that it will remain negative given recent antibiotic therapy. I recommend continuing IV daptomycin for at least 6 weeks. It can be dosed after hemodialysis. I will arrange follow-up with Dr. Adrian Prows, her infectious disease doctor in [place], [place].  Diagnosis: Lumbar infection  Culture Result: Culture negative but with gram-positive cocci seen on aspirate Gram stain 3/18  Allergies  Allergen Reactions  . Ceftazidime Other (See Comments)    myoclonus  . Codeine Itching and Other (See Comments)    Reaction:  Confusion/Hallucinations   . Contrast Media [Iodinated Diagnostic Agents] Other (See Comments)    "skin peel" breaks into a blistered rash, then peel. If given Benadryl and solumedrol she will be okay  . Oxycodone Other (See Comments)    Reaction:  Confusion/Hallucinations   . Quinine Derivatives Rash  . Tape Other (See Comments)    Skin is very thin and will blister and pull off skin with any tape except paper tape.  PLEASE USE PAPER TAPE.    OPAT Orders Discharge antibiotics: Per pharmacy protocol Daptomycin  Duration: 6 weeks End Date: 01/13/2017  [facility] Care Per Protocol:  Labs weekly while on IV antibiotics: _x_ CBC with differential _x_ BMP _x_ CK _x_ CRP _x_ ESR __ Vancomycin trough  __ Please pull PIC at completion of IV antibiotics _x_ Please leave PIC in place until doctor has seen patient or been notified  Fax weekly labs to [phone]  Clinic Follow Up Appt: I will arrange follow-up at the Coumadin clinic with Dr. Adrian Prows         Michel Bickers, [place] for [place] [phone] pager   [phone] cell 12/03/2016, 11:42 AM

## 2016-12-04 LAB — PROTIME-INR
INR: 1.69
Prothrombin Time: 20.1 seconds — ABNORMAL HIGH (ref 11.4–15.2)

## 2016-12-04 LAB — CBC
HCT: 31 % — ABNORMAL LOW (ref 36.0–46.0)
HEMATOCRIT: 32.9 % — AB (ref 36.0–46.0)
HEMOGLOBIN: 10.3 g/dL — AB (ref 12.0–15.0)
HEMOGLOBIN: 9.9 g/dL — AB (ref 12.0–15.0)
MCH: 29.6 pg (ref 26.0–34.0)
MCH: 29.9 pg (ref 26.0–34.0)
MCHC: 31.3 g/dL (ref 30.0–36.0)
MCHC: 31.9 g/dL (ref 30.0–36.0)
MCV: 93.7 fL (ref 78.0–100.0)
MCV: 94.5 fL (ref 78.0–100.0)
Platelets: 212 10*3/uL (ref 150–400)
Platelets: 221 10*3/uL (ref 150–400)
RBC: 3.48 MIL/uL — ABNORMAL LOW (ref 3.87–5.11)
RBC: 3.31 MIL/uL — AB (ref 3.87–5.11)
RDW: 17 % — ABNORMAL HIGH (ref 11.5–15.5)
RDW: 17.3 % — AB (ref 11.5–15.5)
WBC: 7.8 10*3/uL (ref 4.0–10.5)
WBC: 8.1 10*3/uL (ref 4.0–10.5)

## 2016-12-04 LAB — GLUCOSE, CAPILLARY
GLUCOSE-CAPILLARY: 130 mg/dL — AB (ref 65–99)
Glucose-Capillary: 113 mg/dL — ABNORMAL HIGH (ref 65–99)
Glucose-Capillary: 139 mg/dL — ABNORMAL HIGH (ref 65–99)
Glucose-Capillary: 158 mg/dL — ABNORMAL HIGH (ref 65–99)
Glucose-Capillary: 161 mg/dL — ABNORMAL HIGH (ref 65–99)

## 2016-12-04 LAB — CK: CK TOTAL: 42 U/L (ref 38–234)

## 2016-12-04 MED ORDER — PENTAFLUOROPROP-TETRAFLUOROETH EX AERO: 1.0000 "application " | INHALATION_SPRAY | CUTANEOUS | Status: DC | PRN

## 2016-12-04 MED ORDER — LIDOCAINE HCL (PF) 1 % IJ SOLN: 5.0000 mL | INTRAMUSCULAR | Status: DC | PRN

## 2016-12-04 MED ORDER — OXYCODONE HCL ER 10 MG PO T12A: 10.0000 mg | EXTENDED_RELEASE_TABLET | Freq: Two times a day (BID) | ORAL | 0 refills | Status: DC

## 2016-12-04 MED ORDER — GABAPENTIN 600 MG PO TABS: 300.0000 mg | ORAL_TABLET | Freq: Every day | ORAL | 1 refills | Status: AC

## 2016-12-04 MED ORDER — SODIUM CHLORIDE 0.9 % IV SOLN: 100.0000 mL | INTRAVENOUS | Status: DC | PRN

## 2016-12-04 MED ORDER — ALTEPLASE 2 MG IJ SOLR
2.0000 mg | Freq: Once | INTRAMUSCULAR | Status: DC | PRN
  Filled 2016-12-04: qty 2

## 2016-12-04 MED ORDER — SODIUM CHLORIDE 0.9 % IV SOLN: 500.0000 mg | INTRAVENOUS | 0 refills | Status: DC

## 2016-12-04 MED ORDER — WARFARIN SODIUM 4 MG PO TABS
4.0000 mg | ORAL_TABLET | Freq: Once | ORAL | Status: AC
  Administered 2016-12-04: 4 mg via ORAL
  Filled 2016-12-04: qty 1

## 2016-12-04 NOTE — Progress Notes (Signed)
HD tx initiated via 15Gx2 w/o problem, pull/push/flush equally w/o problem, VSS, will cont to monitor while on HD tx 

## 2016-12-04 NOTE — Progress Notes (Signed)
PROGRESS NOTE    [name]   ZOX:[id number]  DOB: 22-May-1934  DOA: 11/28/2016 PCP: [name], MD   Brief Narrative:  [name] is a 83 y.o. female with medical history significant of ESRD dialysis on MWF, A.Fib on coumadin, HTN.  Patient is being treated for diskitis / osteomyelitis of L2-L3 with Vancomycin with dialysis. Aspirate in March demonstrated GPC in pairs on GS but cultures without growth. Despite treatment, her back pain has worsened and she presented to the ED at [facility].    CT reveals worsening L2-L3 discitis/ osteomyelitis with severe canal stenosis.  She was seen by ID on 6/29. They recommended to hold all antibiotics for a repeat aspiration of disc space and culture as soon as possible. Post aspiration started on daptomycin. Unable to discharge due to uncontrolled pain and inability to sit for 3 hours for HD.   Subjective: Back pain seems to have improved , tolerating sitting in the chair   Assessment & Plan:  HOSPITAL COURSE:   Discitis and Osteomyelitis of lumbar spine  worsening L2-L3 discitis/ osteomyelitis with severe canal stenosis on CT on 6/29 consulted  ID - previously on Vanc, now started on daptomycin, follow-up cultures of lumbar aspirate, Gram stain continue to be negative, continue daptomycin with hemodialysis for 6 weeks as per Dr. Orvan Falconer Need to make sure that HD unit has daptomycin prior to DC  Status post L2-L3 disc aspiration on 12/01/16 needs to wear back brace which she has been resistant to wear- Patient encouraged to wear   TLSO brace ContinueTramadol, Oxycodone, Neurontin dose increased to 300 mg qhs ,added oxycontin for better pain control ID Dr Algis Liming had ordered ankle-brachial index study on 11/29/16, which was abnormal,Right TBI of 0.14 and left TBI of 0.36 is suggestive of abnormal arterial flow at rest, Unable to obtain bilateral ABI's due to non-compressible arteries likely due to  calcification Patient will continue with IV  daptomycin for 6 weeks and follow-up with Dr. Sampson Goon in [facility]   Chronic atrial fibrillation  -CHA2DS2-VASc Score at least 6 -Coumadin held for aspiration of disc space- placed initially oniv Heparin until INR therapeutic  high risk for CVA cont Cardizem  INR still subtherapeutic 1.69 , switched heparin to Lovenox for bridging and continue Lovenox until INR greater than 2.0   Anticoagulation management - INR  subtherapeutic at the time of discharge, patient started on Lovenox given Vit K 2 mg given on 6/30  ESRD on dialysis  - MWF dialysis - appreciate nephrology follow up  Early cirrhosis - noted on CT 6/29  Right middle lobe pulm nodules - stable from last imaging 13 mo ago-f/u Ct in 1 yr  dCHF- chronic Patient was onlasix 80 BID as outpt - does not make urine and therefore discontinued - mod conc hypertrophy on last ECHO 1/17  Diabetes mellitus 2 Resume Levemir  Hypokalemia - resolved  Hyponatremia - correct with dialysis / per nephro  Right sciatic nerve stimulator    DVT prophylaxis: SCDs- INR still therapeutic Code Status: DNR Family Communication: By the bedside Disposition Plan:  DC to SNF when patient's pain is better controlled, daptomycin available at HD center in [place]   Consultants:   ID  IR  Nephrology Procedures:    S/P L2 -L3 fluoro guided disc aspiration 7/3   Antimicrobials:  Anti-infectives    Start     Dose/Rate Route Frequency Ordered Stop   12/05/16 0000  DAPTOmycin 500 mg in sodium chloride 0.9 % 100 mL  500 mg 220 mL/hr over 30 Minutes Intravenous Every 48 hours 12/04/16 0822 01/14/17 2359   12/04/16 0000  DAPTOmycin 500 mg in sodium chloride 0.9 % 100 mL  Status:  Discontinued    Comments:  Daptomycin to be administered after hemodialysis   500 mg 220 mL/hr over 30 Minutes Intravenous Every M-W-F (Hemodialysis) 12/03/16 0830 12/04/16    12/03/16 1500  DAPTOmycin (CUBICIN) 500 mg in  sodium chloride 0.9 % IVPB  Status:  Discontinued     500 mg 220 mL/hr over 30 Minutes Intravenous Every 48 hours 12/01/16 1559 12/01/16 1614   12/03/16 1400  DAPTOmycin (CUBICIN) 500 mg in sodium chloride 0.9 % IVPB     500 mg 220 mL/hr over 30 Minutes Intravenous Every 48 hours 12/03/16 1222     12/01/16 1700  DAPTOmycin (CUBICIN) 500 mg in sodium chloride 0.9 % IVPB  Status:  Discontinued     500 mg 220 mL/hr over 30 Minutes Intravenous Every 48 hours 12/01/16 1614 12/03/16 1222   12/01/16 1500  DAPTOmycin (CUBICIN) 523 mg in sodium chloride 0.9 % IVPB  Status:  Discontinued     8 mg/kg  65.4 kg 220.9 mL/hr over 30 Minutes Intravenous Every 48 hours 12/01/16 1217 12/01/16 1559       Objective: Vitals:   12/03/16 2029 12/04/16 0105 12/04/16 0628 12/04/16 0634  BP: 126/60 129/64 131/68 (!) 142/51  Pulse: 92 77 82 83  Resp: 16 16 18 18   Temp: 98.4 F (36.9 C) 98.6 F (37 C) 97.9 F (36.6 C)   TempSrc: Oral Oral Oral   SpO2: 97% 97% 93% 98%  Weight:      Height:        Intake/Output Summary (Last 24 hours) at 12/04/16 0826 Last data filed at 12/03/16 2030  Gross per 24 hour  Intake              590 ml  Output                0 ml  Net              590 ml   Filed Weights   11/30/16 0644 11/30/16 0951 12/03/16 0015  Weight: 66.8 kg (147 lb 4.3 oz) 65.4 kg (144 lb 2.9 oz) 67 kg (147 lb 11.3 oz)    Examination: General exam:Continues to have a lot of pain with minimal movement HEENT: PERRLA, oral mucosa moist, no sclera icterus or thrush Respiratory system: Clear to auscultation. Respiratory effort normal. Cardiovascular system: S1 & S2 heard, RRR.  No murmurs  Gastrointestinal system: Abdomen soft, non-tender, nondistended. Normal bowel sound. No organomegaly Central nervous system: Alert and oriented. No focal neurological deficits. Extremities: No cyanosis, clubbing or edema MSK- tenderness in lower back noted.  Skin: No rashes or ulcers Psychiatry:  Mood & affect  appropriate.     Data Reviewed: I have personally reviewed following labs and imaging studies  CBC:  Recent Labs Lab 11/27/16 1617 12/02/16 0229 12/02/16 2003 12/03/16 0518 12/04/16 0545  WBC 7.5 7.9 7.2 7.9 7.8  HGB 12.3 11.3* 10.7* 11.3* 10.3*  HCT 36.8 35.6* 34.8* 35.9* 32.9*  MCV 92.2 93.9 94.3 93.7 94.5  PLT 282 259 250 242 212   Basic Metabolic Panel:  Recent Labs Lab 11/30/16 0304 12/01/16 0321 12/02/16 0229 12/02/16 2003 12/03/16 0518  NA 127* 133* 132* 134* 131*  K 3.8 3.7 4.0 4.3 3.8  CL 94* 98* 96* 96* 95*  CO2 21* 24 25 26  26  GLUCOSE 108* 110* 150* 183* 174*  BUN 35* 17 27* 39* 19  CREATININE 5.01* 3.64* 4.81* 5.40* 3.24*  CALCIUM 8.1* 8.3* 8.3* 8.4* 8.2*  PHOS  --   --   --  2.7  --    GFR: Estimated Creatinine Clearance: 12.3 mL/min (A) (by C-G formula based on SCr of 3.24 mg/dL (H)). Liver Function Tests:  Recent Labs Lab 11/27/16 1617 12/02/16 2003 12/03/16 0518  AST 27  --  20  ALT 16  --  12*  ALKPHOS 93  --  73  BILITOT 0.9  --  0.6  PROT 7.3  --  6.4*  ALBUMIN 3.2* 2.5* 2.8*    Recent Labs Lab 11/27/16 1617  LIPASE 18   No results for input(s): AMMONIA in the last 168 hours. Coagulation Profile:  Recent Labs Lab 11/30/16 0304 12/01/16 0321 12/02/16 0229 12/03/16 0518 12/04/16 0545  INR 1.68 1.31 1.17 1.37 1.69   Cardiac Enzymes: No results for input(s): CKTOTAL, CKMB, CKMBINDEX, TROPONINI in the last 168 hours. BNP (last 3 results) No results for input(s): PROBNP in the last 8760 hours. HbA1C: No results for input(s): HGBA1C in the last 72 hours. CBG:  Recent Labs Lab 12/03/16 1202 12/03/16 1649 12/03/16 2048 12/03/16 2137 12/04/16 0811  GLUCAP 141* 197* 85 114* 113*   Lipid Profile: No results for input(s): CHOL, HDL, LDLCALC, TRIG, CHOLHDL, LDLDIRECT in the last 72 hours. Thyroid Function Tests: No results for input(s): TSH, T4TOTAL, FREET4, T3FREE, THYROIDAB in the last 72 hours. Anemia Panel: No  results for input(s): VITAMINB12, FOLATE, FERRITIN, TIBC, IRON, RETICCTPCT in the last 72 hours. Urine analysis:    Component Value Date/Time   COLORURINE AMBER (A) 08/09/2016 2204   APPEARANCEUR TURBID (A) 08/09/2016 2204   LABSPEC 1.014 08/09/2016 2204   PHURINE 6.0 08/09/2016 2204   GLUCOSEU NEGATIVE 08/09/2016 2204   HGBUR MODERATE (A) 08/09/2016 2204   BILIRUBINUR NEGATIVE 08/09/2016 2204   KETONESUR NEGATIVE 08/09/2016 2204   PROTEINUR 100 (A) 08/09/2016 2204   NITRITE NEGATIVE 08/09/2016 2204   LEUKOCYTESUR LARGE (A) 08/09/2016 2204   Sepsis Labs: @LABRCNTIP (procalcitonin:4,lacticidven:4) ) Recent Results (from the past 240 hour(s))  Body fluid culture     Status: None (Preliminary result)   Collection Time: 12/01/16  8:50 AM  Result Value Ref Range Status   Specimen Description FLUID  Final   Special Requests L2 3 DISC ASPIRATE SENT 2 SYRINGES  Final   Gram Stain   Final    RARE WBC PRESENT, PREDOMINANTLY MONONUCLEAR NO ORGANISMS SEEN    Culture NO GROWTH 2 DAYS  Final   Report Status PENDING  Incomplete         Radiology Studies: No results found.    Scheduled Meds: . acetaminophen  650 mg Oral QID  . calcium acetate  1,334 mg Oral TID WC  . collagenase   Topical Daily  . diltiazem  120 mg Oral QHS  . enoxaparin (LOVENOX) injection  1 mg/kg Subcutaneous Q24H  . famotidine  20 mg Oral QHS  . feeding supplement (ENSURE ENLIVE)  237 mL Oral BID BM  . gabapentin  300 mg Oral Once per day on Mon Wed Fri  . gabapentin  300 mg Oral QHS  . insulin aspart  0-9 Units Subcutaneous TID WC  . lidocaine  1 patch Transdermal Q24H  . multivitamin  1 tablet Oral Daily  . omega-3 acid ethyl esters  1,000 mg Oral Daily  . oxyCODONE  10 mg Oral Q12H  .  PARoxetine  40 mg Oral QHS  . pravastatin  20 mg Oral QPM  . senna-docusate  2 tablet Oral BID  . Warfarin - Pharmacist Dosing Inpatient   Does not apply q1800   Continuous Infusions: . DAPTOmycin (CUBICIN)  IV  Stopped (12/03/16 1515)     LOS: 6 days    Time spent in minutes: 35    Richarda Overlie, MD [facility] Pager: [email or web address] Password [email or web address] 12/04/2016, 8:26 AM

## 2016-12-04 NOTE — Progress Notes (Addendum)
ANTICOAGULATION CONSULT NOTE - Follow Up Consult  Pharmacy Consult for LMWH/Coumadin Indication: atrial fibrillation  Allergies  Allergen Reactions  . Ceftazidime Other (See Comments)    myoclonus  . Codeine Itching and Other (See Comments)    Reaction:  Confusion/Hallucinations   . Contrast Media [Iodinated Diagnostic Agents] Other (See Comments)    "skin peel" breaks into a blistered rash, then peel. If given Benadryl and solumedrol she will be okay  . Oxycodone Other (See Comments)    Reaction:  Confusion/Hallucinations   . Quinine Derivatives Rash  . Tape Other (See Comments)    Skin is very thin and will blister and pull off skin with any tape except paper tape.  PLEASE USE PAPER TAPE.    Patient Measurements: Height: 5\' 5"  (165.1 cm) Weight: 147 lb 11.3 oz (67 kg) IBW/kg (Calculated) : 57 Heparin Dosing Weight:  65.4 kg  Vital Signs: Temp: 97.9 F (36.6 C) (07/06 0628) Temp Source: Oral (07/06 0628) BP: 142/51 (07/06 0634) Pulse Rate: 83 (07/06 0634)  Labs:  Recent Labs  12/02/16 0229 12/02/16 0844 12/02/16 2003 12/03/16 0518 12/03/16 0754 12/04/16 0545  HGB 11.3*  --  10.7* 11.3*  --  10.3*  HCT 35.6*  --  34.8* 35.9*  --  32.9*  PLT 259  --  250 242  --  212  LABPROT 14.9  --   --  17.0*  --  20.1*  INR 1.17  --   --  1.37  --  1.69  HEPARINUNFRC 0.23* 0.37  --  1.68* 0.32  --   CREATININE 4.81*  --  5.40* 3.24*  --   --    Assessment: Warf pta for afib was on hold for lumbar spine aspiration 7/3. CHA2DS2-VASc Score >/= 6. Started on LMWH as patient was pending discharge yesterday; however, discharge delayed 2/2 HD center having to order daptomycin. Today INR is up; CBC is stable and no bleeding is noted. Would check anti-Xa in this patient periodically and continue LMWH until INR is therapeutic x2.  - Coumadin pta: 2.5 mg daily (admit INR 3.45).   Goal of Therapy:  Anti-Xa level 0.6-1 units/ml 4hrs after LMWH dose given  INR 2-3 Monitor platelets by  anticoagulation protocol: Yes   Plan:   Continue LMWH 1 mg/kg q24h Coumadin 4mg  po x1 tonigh Daily CBC, INR  Alfredo BachJoseph Jammy Stlouis, PharmD Clinical Pharmacist [phone] (Pager) 12/04/2016 8:43 AM

## 2016-12-04 NOTE — Progress Notes (Signed)
[facility] Progress Note   Dialysis Orders: Center: [facility] on MWF . EDW 67kgHD Bath 2k, 2.5 ca Time 3hrs Heparin 2000 bolus with infusion hourly 400 . Access RUA AVF 15 g BFR 450DFR 800 Epogen 2200Units IV/HD Venofer 50mg  weekly Hd / NO vit d listed   Assessment/ Plan:   1. Osteomyelitis/diskitis of L2-3, cultures negative to date from aspirate on 12/01/16, ID following and recommending IV daptomycin after HD 2. ESRD- continue TTS schedule (outpatient at [facility]) --> may convert to MWF when close to d/c. Challenging edw due to ^ BP - Unfortunately still no Dapto at outpt [place] unit; I called 7/6 and SW is still working on it. - HD in the AM (Sat) 3. HTN/volume- stable 4. Chronic A fib- had been on coumadin but now Lovenox. 5. DM- per primary 6. Anemia of chronic kidney disease- ESA on hold due to Hgb >12 but now down to 11.3, will restart if cont to drop. 7. Disposition- per primary, currently on hold due to pain.  Another issue before discharge is that her dialysis unit, [facility] on [address] does not have daptomycin on formulary and they have to special order it. Also she will need to be able to sit in a chair for HD.   Subjective:   C/o back pain, but denies f/c/n/v. Pain worse when sitting more upright.   Objective:   BP (!) 127/58 (BP Location: Left Arm)   Pulse 86   Temp 97.7 F (36.5 C) (Oral)   Resp 20   Ht 5\' 5"  (1.651 m)   Wt 67 kg (147 lb 11.3 oz)   SpO2 98%   BMI 24.58 kg/m   Intake/Output Summary (Last 24 hours) at 12/04/16 1027 Last data filed at 12/03/16 2030  Gross per 24 hour  Intake              590 ml  Output                0 ml  Net              590 ml   Weight change:   Physical Exam: Gen:NAD CVS:no rub Resp:cta ZOX:WRUEAV Ext:no edema, RUE AVF +T/B, but very pulsatile; appears to be a BCF that has been tunneled medially w/ a new anastomosis.    Imaging: No results found.  Labs: BMET  Recent Labs Lab 11/28/16 4098 11/29/16 0213 11/30/16 0304 12/01/16 0321 12/02/16 0229 12/02/16 2003 12/03/16 0518  NA 130* 128* 127* 133* 132* 134* 131*  K 3.5 3.8 3.8 3.7 4.0 4.3 3.8  CL 97* 96* 94* 98* 96* 96* 95*  CO2 23 20* 21* 24 25 26 26   GLUCOSE 87 99 108* 110* 150* 183* 174*  BUN 16 24* 35* 17 27* 39* 19  CREATININE 3.16* 3.94* 5.01* 3.64* 4.81* 5.40* 3.24*  CALCIUM 8.4* 8.2* 8.1* 8.3* 8.3* 8.4* 8.2*  PHOS  --   --   --   --   --  2.7  --    CBC  Recent Labs Lab 12/02/16 0229 12/02/16 2003 12/03/16 0518 12/04/16 0545  WBC 7.9 7.2 7.9 7.8  HGB 11.3* 10.7* 11.3* 10.3*  HCT 35.6* 34.8* 35.9* 32.9*  MCV 93.9 94.3 93.7 94.5  PLT 259 250 242 212    Medications:    . acetaminophen  650 mg Oral QID  . calcium acetate  1,334 mg Oral TID WC  . collagenase   Topical Daily  .  diltiazem  120 mg Oral QHS  . enoxaparin (LOVENOX) injection  1 mg/kg Subcutaneous Q24H  . famotidine  20 mg Oral QHS  . feeding supplement (ENSURE ENLIVE)  237 mL Oral BID BM  . gabapentin  300 mg Oral Once per day on Mon Wed Fri  . gabapentin  300 mg Oral QHS  . insulin aspart  0-9 Units Subcutaneous TID WC  . lidocaine  1 patch Transdermal Q24H  . multivitamin  1 tablet Oral Daily  . omega-3 acid ethyl esters  1,000 mg Oral Daily  . oxyCODONE  10 mg Oral Q12H  . PARoxetine  40 mg Oral QHS  . pravastatin  20 mg Oral QPM  . senna-docusate  2 tablet Oral BID  . warfarin  4 mg Oral ONCE-1800  . Warfarin - Pharmacist Dosing Inpatient   Does not apply q1800      Paulene FloorJames Takari Duncombe, MD 12/04/2016, 10:27 AM

## 2016-12-04 NOTE — Progress Notes (Signed)
Physical Therapy Treatment Patient Details Name: [name] MRN: [id number] DOB: 1933-07-02 Today's Date: 12/04/2016    History of Present Illness  [name] is a 83 y.o. female with medical history significant of ESRD dialysis on MWF, A.Fib on coumadin, HTN.  Patient has recently been battling diskitis / osteomyelitis of L2-L3.  Aspirate in March demonstrated GPC in pairs on GS but cultures without growth.  She was put on vancomycin with dialysis.  Despite this her back pain has worsened and she presented to the ED at [facility] once again today.  Work up there includes CT abd/pelvis which demonstrates worsening of her osteomyelitis. She is s/p disc aspiration on 12/01/16 at L2/3 level.     PT Comments    Pt is progressing with her mobility today, able to sit on hard BSC completely upright for at least 8 mins.  Moving with less assistance and with RW preforming multiple transfers in one session.  She continues to want to sit reclined back and turned off of her left hip in the recliner chair, but reports that the chairs at HD do recline.  PT to follow acutely.  Follow Up Recommendations  SNF     Equipment Recommendations  None recommended by PT    Recommendations for Other Services   NA     Precautions / Restrictions Precautions Precautions: Fall;Back Required Braces or Orthoses: Spinal Brace Spinal Brace: Other (comment);Thoracolumbosacral orthotic (TLSO ordered, however, pt cannot tolerate it due to pain) Spinal Brace Comments: pt does not want to use any back brace today.     Mobility  Bed Mobility Overal bed mobility: Needs Assistance Bed Mobility: Rolling;Sidelying to Sit Rolling: Supervision Sidelying to sit: Min assist;HOB elevated       General bed mobility comments: Pt able to roll to her side wanted to try the left side of the bed today and PT was agreeable (we had been getting out on the right side of the bed), Pt needed min assist at trunk to get to sitting EOB  with HOB only mildly elevated and pt relying on bed rails for leveraage.   Transfers Overall transfer level: Needs assistance Equipment used: Rolling walker (2 wheeled) Transfers: Sit to/from [employer] Sit to Stand: Mod assist Stand pivot transfers: Mod assist       General transfer comment: Mod assist to stand from elevated bed and elevated BSC multiple times for peri care and to get the recliner chair on the correct side of the bed.  Pt tolerated being on BSC for ~ 8 mins sitting upright which is a significant improvement over yesterday where she could not even tolerate sitting EOB for 5 mins.    Ambulation/Gait Ambulation/Gait assistance: +2 physical assistance;Mod assist Ambulation Distance (Feet): 5 Feet Assistive device: Rolling walker (2 wheeled) Gait Pattern/deviations: Step-to pattern;Leaning posteriorly (buckling legs)     General Gait Details: 5-6 pivotal steps from bed to [facility], back to bed, and then to recliner chair, so more repetition than yesterday, still leaning posteriorly over weak legs.            Balance Overall balance assessment: Needs assistance Sitting-balance support: Feet supported;Bilateral upper extremity supported Sitting balance-Leahy Scale: Fair Sitting balance - Comments: Pt prefers to lean off of her left hip in sitting if she has to be EOB more than a few mins.   Postural control: Right lateral lean;Posterior lean Standing balance support: Bilateral upper extremity supported Standing balance-Leahy Scale: Poor Standing balance comment: needs mod assist of  trunk in standing and support from RW                            Cognition Arousal/Alertness: Awake/alert Behavior During Therapy: [facility]FL for tasks assessed/performed Overall Cognitive Status: Within Functional Limits for tasks assessed                                           General Comments General comments (skin integrity, edema, etc.): Pt  continues to want her recliner chair reclined significantly and then rotates to her right side to get relief from her left leg.  She reports she only sat up a few minutes after I helped her to the chair yesterday, but has a goal to sit up longer today.  She did say that the HD chairs and the dialysis center do recline.       Pertinent Vitals/Pain Pain Assessment: Faces Faces Pain Scale: Hurts even more Pain Location: back and left hip Pain Descriptors / Indicators: Grimacing;Guarding Pain Intervention(s): Limited activity within patient's tolerance;Monitored during session;Repositioned;Premedicated before session           PT Goals (current goals can now be found in the care plan section) Acute Rehab PT Goals Patient Stated Goal: decrease left hip/leg pain Progress towards PT goals: Progressing toward goals    Frequency    Min 3X/week      PT Plan Current plan remains appropriate       AM-PAC PT "6 Clicks" Daily Activity  Outcome Measure  Difficulty turning over in bed (including adjusting bedclothes, sheets and blankets)?: None Difficulty moving from lying on back to sitting on the side of the bed? : Total Difficulty sitting down on and standing up from a chair with arms (e.g., wheelchair, bedside commode, etc,.)?: Total Help needed moving to and from a bed to chair (including a wheelchair)?: A Lot Help needed walking in hospital room?: A Lot Help needed climbing 3-5 steps with a railing? : Total 6 Click Score: 11    End of Session Equipment Utilized During Treatment: Gait belt Activity Tolerance: Patient limited by pain;Patient limited by fatigue Patient left: in chair;with call bell/phone within reach;with chair alarm set Nurse Communication: Mobility status PT Visit Diagnosis: Muscle weakness (generalized) (M62.81);Unsteadiness on feet (R26.81);Other abnormalities of gait and mobility (R26.89);Pain Pain - Right/Left: Left Pain - part of body: Hip (and back)      Time: [id number] PT Time Calculation (min) (ACUTE ONLY): 21 min  Charges:  $Therapeutic Activity: 8-22 mins    Bill Yohn B. Chellsea Beckers, PT, DPT [phone]           12/04/2016, 11:01 AM

## 2016-12-04 NOTE — Progress Notes (Signed)
Pharmacy Antibiotic Note  [name] is a 83 y.o. female admitted on 11/28/2016 with history of osteomyelitis of right heel/diskitis s/p IR guided aspiration.  Today is day 3 of daptomycin therapy. Patient is ESRD on HD MWF, last 7/5 was full session. Gram stain from IR aspirate is negative x2 days. Patient had 6 weeks of vancomycin; however failed therapy. Plan is to continue daptomycin x8 weeks. Discharge pending HD center ordering daptomycin so there is no delay in therapy. Patient is currently afebrile with normal white count. CK pending  Plan: Daptomycin 8mg /kg q48h Baseline + weekly CK Monitor HD plans and tolerance F/u C/S, LOT   Height: 5\' 5"  (165.1 cm) Weight: 147 lb 11.3 oz (67 kg) IBW/kg (Calculated) : 57  Temp (24hrs), Avg:98.3 F (36.8 C), Min:97.7 F (36.5 C), Max:98.8 F (37.1 C)   Recent Labs Lab 11/27/16 1617  11/30/16 0304 12/01/16 0321 12/02/16 0229 12/02/16 2003 12/03/16 0518 12/04/16 0545  WBC 7.5  --   --   --  7.9 7.2 7.9 7.8  CREATININE 2.26*  < > 5.01* 3.64* 4.81* 5.40* 3.24*  --   < > = values in this interval not displayed.  Estimated Creatinine Clearance: 12.3 mL/min (A) (by C-G formula based on SCr of 3.24 mg/dL (H)).    Allergies  Allergen Reactions  . Ceftazidime Other (See Comments)    myoclonus  . Codeine Itching and Other (See Comments)    Reaction:  Confusion/Hallucinations   . Contrast Media [Iodinated Diagnostic Agents] Other (See Comments)    "skin peel" breaks into a blistered rash, then peel. If given Benadryl and solumedrol she will be okay  . Oxycodone Other (See Comments)    Reaction:  Confusion/Hallucinations   . Quinine Derivatives Rash  . Tape Other (See Comments)    Skin is very thin and will blister and pull off skin with any tape except paper tape.  PLEASE USE PAPER TAPE.    Antimicrobials this admission: Dapto 7/3>> (8/28)  Dose adjustments this admission:  Microbiology results: 7/3 Body Fluid:  Pending  Thank you for allowing pharmacy to be a part of this patient's care.  Alfredo BachJoseph Arminger, Cleotis NipperBS, PharmD Clinical Pharmacy Resident [phone] (Pager) 12/04/2016 8:50 AM

## 2016-12-05 LAB — RENAL FUNCTION PANEL
Albumin: 2.6 g/dL — ABNORMAL LOW (ref 3.5–5.0)
Anion gap: 11 (ref 5–15)
BUN: 47 mg/dL — AB (ref 6–20)
CHLORIDE: 93 mmol/L — AB (ref 101–111)
CO2: 26 mmol/L (ref 22–32)
Calcium: 8.6 mg/dL — ABNORMAL LOW (ref 8.9–10.3)
Creatinine, Ser: 5.22 mg/dL — ABNORMAL HIGH (ref 0.44–1.00)
GFR calc Af Amer: 8 mL/min — ABNORMAL LOW (ref >60)
GFR, EST NON AFRICAN AMERICAN: 7 mL/min — AB (ref >60)
Glucose, Bld: 158 mg/dL — ABNORMAL HIGH (ref 65–99)
POTASSIUM: 4.9 mmol/L (ref 3.5–5.1)
Phosphorus: 2.9 mg/dL (ref 2.5–4.6)
Sodium: 130 mmol/L — ABNORMAL LOW (ref 135–145)

## 2016-12-05 LAB — GLUCOSE, CAPILLARY
Glucose-Capillary: 171 mg/dL — ABNORMAL HIGH (ref 65–99)
Glucose-Capillary: 134 mg/dL — ABNORMAL HIGH (ref 65–99)
Glucose-Capillary: 164 mg/dL — ABNORMAL HIGH (ref 65–99)
Glucose-Capillary: 93 mg/dL (ref 65–99)

## 2016-12-05 LAB — CK ISOENZYMES
CK BB: 0 %
CK MB: 0 % (ref 0–3)
CK MM: 64 % — AB (ref 97–100)
Creatine Kinase-Total: 37 U/L (ref 24–173)
MACRO TYPE 1: 36 % — AB
Macro Type 2: 0 %

## 2016-12-05 LAB — BODY FLUID CULTURE: Culture: NO GROWTH

## 2016-12-05 LAB — PROTIME-INR
INR: 1.97
Prothrombin Time: 22.8 s — ABNORMAL HIGH (ref 11.4–15.2)

## 2016-12-05 MED ORDER — WARFARIN SODIUM 5 MG PO TABS
2.5000 mg | ORAL_TABLET | Freq: Once | ORAL | Status: AC
  Administered 2016-12-05: 2.5 mg via ORAL
  Filled 2016-12-05: qty 1

## 2016-12-05 MED ORDER — INSULIN DETEMIR 100 UNIT/ML ~~LOC~~ SOLN
10.0000 [IU] | Freq: Every day | SUBCUTANEOUS | Status: DC
  Administered 2016-12-05 – 2016-12-07 (×3): 10 [IU] via SUBCUTANEOUS
  Filled 2016-12-05 (×4): qty 0.1

## 2016-12-05 NOTE — Progress Notes (Signed)
PROGRESS NOTE    [name]   ZOX:[id number]  DOB: 03-12-1934  DOA: 11/28/2016 PCP: [name], MD   Brief Narrative:  [name] is a 83 y.o. female with medical history significant for but not limited to ESRD dialysis on MWF, and A.Fib on coumadin who was receiving vancomycin at dialysis for discitis/osteomyelitis of L2-L3 presenting with an admitted for worsening low back pain due to worsening osteomyelitis. Patient being followed by infectious disease, started on daptomycin, post repeat aspiration.   Unable to discharge due to uncontrolled pain and inability to sit for 3 hours for HD.   Subjective: Back pain better, no fever or chills  Assessment & Plan: #1 Discitis and Osteomyelitis of lumbar spine: CT( 6/29) -worsening discitis/ osteo with severe canal stenosis Lumbar aspirate(12/01/16)- Gram stain negative - f/u Culture Continue daptomycin with hemodialysis for 6 weeks, per  ID, Dr. Orvan Falconer Need to make sure that HD unit has daptomycin prior to DC  Back brace/TLSO brace ContinuePain Control  Follow-up with Dr. Sampson Goon in [place] on discharge   #2 Chronic atrial fibrillation  CHA2DS2-VASc Score 6 Rate control with Cardizem High risk for CVA Cont Coumadin with Lovenox bridge till INR therapeutic  # 3 Chronic CHF: Stable 2-D echocardiogram (06/19/2015) -EF 50-55%, moderate hypertrophy Lasix discontinued  #4 hyponatremia: Per Nephrology with adjustment of dialysate prn  #5 diabetes mellitus: Levemir Sliding-scale insulin    #6 Early cirrhosis: noted on CT 11/27/16 - outpatient f/u  #7 Right middle lobe pulm nodule: stable from last imaging 13 mo ago-f/u Ct in 1 yr     DVT prophylaxis: SCDs- INR still therapeutic Code Status: DNR Family Communication: By the bedside Disposition Plan:  DC to SNF when patient's pain is better controlled, daptomycin available at HD center in [facility]   Consultants:    ID  IR  Nephrology Procedures:    S/P L2 -L3 fluoro guided disc aspiration 7/3   Antimicrobials:  Anti-infectives    Start     Dose/Rate Route Frequency Ordered Stop   12/05/16 0000  DAPTOmycin 500 mg in sodium chloride 0.9 % 100 mL     500 mg 220 mL/hr over 30 Minutes Intravenous Every 48 hours 12/04/16 0822 01/14/17 2359   12/04/16 0000  DAPTOmycin 500 mg in sodium chloride 0.9 % 100 mL  Status:  Discontinued    Comments:  Daptomycin to be administered after hemodialysis   500 mg 220 mL/hr over 30 Minutes Intravenous Every M-W-F (Hemodialysis) 12/03/16 0830 12/04/16    12/03/16 1500  DAPTOmycin (CUBICIN) 500 mg in sodium chloride 0.9 % IVPB  Status:  Discontinued     500 mg 220 mL/hr over 30 Minutes Intravenous Every 48 hours 12/01/16 1559 12/01/16 1614   12/03/16 1400  DAPTOmycin (CUBICIN) 500 mg in sodium chloride 0.9 % IVPB     500 mg 220 mL/hr over 30 Minutes Intravenous Every 48 hours 12/03/16 1222     12/01/16 1700  DAPTOmycin (CUBICIN) 500 mg in sodium chloride 0.9 % IVPB  Status:  Discontinued     500 mg 220 mL/hr over 30 Minutes Intravenous Every 48 hours 12/01/16 1614 12/03/16 1222   12/01/16 1500  DAPTOmycin (CUBICIN) 523 mg in sodium chloride 0.9 % IVPB  Status:  Discontinued     8 mg/kg  65.4 kg 220.9 mL/hr over 30 Minutes Intravenous Every 48 hours 12/01/16 1217 12/01/16 1559       Objective:No fever or chills. Back pain improving Vitals:   12/05/16 0237 12/05/16 0305  12/05/16 0402 12/05/16 0448  BP: 119/60 (!) 136/59 (!) 117/52 (!) 122/59  Pulse: 84 91 86 89  Resp: 15 16 18 18   Temp:  97.6 F (36.4 C) 97.7 F (36.5 C) (!) 97.4 F (36.3 C)  TempSrc:  Oral Oral Oral  SpO2: 97% 96% 100% 100%  Weight:  65.6 kg (144 lb 10 oz)    Height:        Intake/Output Summary (Last 24 hours) at 12/05/16 0918 Last data filed at 12/05/16 0237  Gross per 24 hour  Intake                0 ml  Output             2004 ml  Net            -2004 ml   Filed  Weights   12/04/16 1758 12/04/16 2323 12/05/16 0305  Weight: 68.1 kg (150 lb 2.1 oz) 67.6 kg (149 lb 0.5 oz) 65.6 kg (144 lb 10 oz)    Examination: General exam:No acute distress HEENT: PERRLA, oral mucosa moist, no sclera icterus or thrush Respiratory system: Clear to auscultation. Respiratory effort normal. Cardiovascular system: S1 & S2 heard, RRR.  No murmurs  Gastrointestinal system: Abdomen soft, non-tender, nondistended. Normal bowel sound. No organomegaly Central nervous system: Alert and oriented. No focal neurological deficits. Extremities: No cyanosis, clubbing or edema MSK- tenderness in lower back noted.  Skin: No rashes or ulcers Psychiatry:  Mood & affect appropriate.     Data Reviewed: I have personally reviewed following labs and imaging studies  CBC:  Recent Labs Lab 12/02/16 0229 12/02/16 2003 12/03/16 0518 12/04/16 0545 12/04/16 2330  WBC 7.9 7.2 7.9 7.8 8.1  HGB 11.3* 10.7* 11.3* 10.3* 9.9*  HCT 35.6* 34.8* 35.9* 32.9* 31.0*  MCV 93.9 94.3 93.7 94.5 93.7  PLT 259 250 242 212 221   Basic Metabolic Panel:  Recent Labs Lab 12/01/16 0321 12/02/16 0229 12/02/16 2003 12/03/16 0518 12/04/16 2340  NA 133* 132* 134* 131* 130*  K 3.7 4.0 4.3 3.8 4.9  CL 98* 96* 96* 95* 93*  CO2 24 25 26 26 26   GLUCOSE 110* 150* 183* 174* 158*  BUN 17 27* 39* 19 47*  CREATININE 3.64* 4.81* 5.40* 3.24* 5.22*  CALCIUM 8.3* 8.3* 8.4* 8.2* 8.6*  PHOS  --   --  2.7  --  2.9   GFR: Estimated Creatinine Clearance: 7.6 mL/min (A) (by C-G formula based on SCr of 5.22 mg/dL (H)). Liver Function Tests:  Recent Labs Lab 12/02/16 2003 12/03/16 0518 12/04/16 2340  AST  --  20  --   ALT  --  12*  --   ALKPHOS  --  73  --   BILITOT  --  0.6  --   PROT  --  6.4*  --   ALBUMIN 2.5* 2.8* 2.6*   No results for input(s): LIPASE, AMYLASE in the last 168 hours. No results for input(s): AMMONIA in the last 168 hours. Coagulation Profile:  Recent Labs Lab 11/30/16 0304  12/01/16 0321 12/02/16 0229 12/03/16 0518 12/04/16 0545  INR 1.68 1.31 1.17 1.37 1.69   Cardiac Enzymes:  Recent Labs Lab 12/04/16 0911  CKTOTAL 42   BNP (last 3 results) No results for input(s): PROBNP in the last 8760 hours. HbA1C: No results for input(s): HGBA1C in the last 72 hours. CBG:  Recent Labs Lab 12/04/16 0811 12/04/16 1206 12/04/16 1651 12/04/16 2120 12/05/16 0614  GLUCAP 113* 139* 158*  161* 171*   Lipid Profile: No results for input(s): CHOL, HDL, LDLCALC, TRIG, CHOLHDL, LDLDIRECT in the last 72 hours. Thyroid Function Tests: No results for input(s): TSH, T4TOTAL, FREET4, T3FREE, THYROIDAB in the last 72 hours. Anemia Panel: No results for input(s): VITAMINB12, FOLATE, FERRITIN, TIBC, IRON, RETICCTPCT in the last 72 hours. Urine analysis:    Component Value Date/Time   COLORURINE AMBER (A) 08/09/2016 2204   APPEARANCEUR TURBID (A) 08/09/2016 2204   LABSPEC 1.014 08/09/2016 2204   PHURINE 6.0 08/09/2016 2204   GLUCOSEU NEGATIVE 08/09/2016 2204   HGBUR MODERATE (A) 08/09/2016 2204   BILIRUBINUR NEGATIVE 08/09/2016 2204   KETONESUR NEGATIVE 08/09/2016 2204   PROTEINUR 100 (A) 08/09/2016 2204   NITRITE NEGATIVE 08/09/2016 2204   LEUKOCYTESUR LARGE (A) 08/09/2016 2204   Sepsis Labs: @LABRCNTIP (procalcitonin:4,lacticidven:4) ) Recent Results (from the past 240 hour(s))  Body fluid culture     Status: None (Preliminary result)   Collection Time: 12/01/16  8:50 AM  Result Value Ref Range Status   Specimen Description FLUID  Final   Special Requests L2 3 DISC ASPIRATE SENT 2 SYRINGES  Final   Gram Stain   Final    RARE WBC PRESENT, PREDOMINANTLY MONONUCLEAR NO ORGANISMS SEEN    Culture NO GROWTH 3 DAYS  Final   Report Status PENDING  Incomplete         Radiology Studies: No results found.    Scheduled Meds: . acetaminophen  650 mg Oral QID  . calcium acetate  1,334 mg Oral TID WC  . collagenase   Topical Daily  . diltiazem  120 mg  Oral QHS  . enoxaparin (LOVENOX) injection  1 mg/kg Subcutaneous Q24H  . famotidine  20 mg Oral QHS  . feeding supplement (ENSURE ENLIVE)  237 mL Oral BID BM  . gabapentin  300 mg Oral Once per day on Mon Wed Fri  . gabapentin  300 mg Oral QHS  . insulin aspart  0-9 Units Subcutaneous TID WC  . lidocaine  1 patch Transdermal Q24H  . multivitamin  1 tablet Oral Daily  . omega-3 acid ethyl esters  1,000 mg Oral Daily  . oxyCODONE  10 mg Oral Q12H  . PARoxetine  40 mg Oral QHS  . pravastatin  20 mg Oral QPM  . senna-docusate  2 tablet Oral BID  . Warfarin - Pharmacist Dosing Inpatient   Does not apply q1800   Continuous Infusions: . sodium chloride    . sodium chloride    . DAPTOmycin (CUBICIN)  IV Stopped (12/03/16 1515)     LOS: 7 days    Time spent in minutes: 31    OSEI-BONSU,Dangelo Guzzetta, MD [facility] Pager: [phone] [email or web address] Password [email or web address] 12/05/2016, 9:18 AM

## 2016-12-05 NOTE — Progress Notes (Signed)
ANTICOAGULATION CONSULT NOTE - Follow Up Consult  Pharmacy Consult for coumadin/LMWH  Indication: atrial fibrillation  Allergies  Allergen Reactions  . Ceftazidime Other (See Comments)    myoclonus  . Codeine Itching and Other (See Comments)    Reaction:  Confusion/Hallucinations   . Contrast Media [Iodinated Diagnostic Agents] Other (See Comments)    "skin peel" breaks into a blistered rash, then peel. If given Benadryl and solumedrol she will be okay  . Oxycodone Other (See Comments)    Reaction:  Confusion/Hallucinations   . Quinine Derivatives Rash  . Tape Other (See Comments)    Skin is very thin and will blister and pull off skin with any tape except paper tape.  PLEASE USE PAPER TAPE.    Patient Measurements: Height: 5\' 5"  (165.1 cm) Weight: 144 lb 10 oz (65.6 kg) IBW/kg (Calculated) : 57 Heparin Dosing Weight: 65.6 kg  Vital Signs: Temp: (P) 98.4 F (36.9 C) (07/07 1312) Temp Source: (P) Oral (07/07 1312) BP: (P) 161/62 (07/07 1312) Pulse Rate: (P) 58 (07/07 1312)  Labs:  Recent Labs  12/02/16 2003 12/03/16 0518 12/03/16 0754 12/04/16 0545 12/04/16 0911 12/04/16 2330 12/04/16 2340 12/05/16 1106  HGB 10.7* 11.3*  --  10.3*  --  9.9*  --   --   HCT 34.8* 35.9*  --  32.9*  --  31.0*  --   --   PLT 250 242  --  212  --  221  --   --   LABPROT  --  17.0*  --  20.1*  --   --   --  22.8*  INR  --  1.37  --  1.69  --   --   --  1.97  HEPARINUNFRC  --  1.68* 0.32  --   --   --   --   --   CREATININE 5.40* 3.24*  --   --   --   --  5.22*  --   CKTOTAL  --   --   --   --  42  --   --   --     Estimated Creatinine Clearance: 7.6 mL/min (A) (by C-G formula based on SCr of 5.22 mg/dL (H)).   Medications:  Prescriptions Prior to Admission  Medication Sig Dispense Refill Last Dose  . acetaminophen (TYLENOL) 650 MG CR tablet Take 1,300 mg by mouth 2 (two) times daily.   11/27/2016 at 806  . albuterol (PROVENTIL HFA;VENTOLIN HFA) 108 (90 Base) MCG/ACT inhaler Inhale  1 puff into the lungs every 6 (six) hours as needed for wheezing or shortness of breath.    unknown  . Amino Acids-Protein Hydrolys (FEEDING SUPPLEMENT, PRO-STAT SUGAR FREE 64,) LIQD Take 30 mLs by mouth 3 (three) times daily with meals.   11/27/2016 at 1329  . B Complex-C-Folic Acid (RENAL-VITE) 0.8 MG TABS Take 0.8 mg by mouth daily.   11/27/2016 at 806  . Biotin 1 MG CAPS Take 1 mg by mouth daily.    11/27/2016 at 545  . calcium acetate (PHOSLO) 667 MG tablet Take 2,001 mg by mouth 3 (three) times daily with meals. With any food   11/27/2016 at 545  . cyclobenzaprine (FLEXERIL) 5 MG tablet Take 1 tablet (5 mg total) by mouth 3 (three) times daily as needed for muscle spasms. 10 tablet 0 11/26/2016 at 1715  . diltiazem (CARDIZEM CD) 120 MG 24 hr capsule Take 120 mg by mouth daily at 6 PM.    11/26/2016 at 1715  .  furosemide (LASIX) 80 MG tablet Take 80 mg by mouth 2 (two) times daily.   11/27/2016 at 545  . gabapentin (NEURONTIN) 100 MG capsule Take 100 mg by mouth See admin instructions. Take 1 capsule (100 mg) by mouth Sunday,Tuesday, Thursday, Saturday at bedtime   11/26/2016 at 2008  . gabapentin (NEURONTIN) 300 MG capsule Take 300 mg by mouth See admin instructions. Take 1 capsule (300 mg) by mouth Monday, Wednesday, Friday at bedtime   11/25/2016 at 2045  . insulin detemir (LEVEMIR) 100 UNIT/ML injection Inject 0.06 mLs (6 Units total) into the skin 2 (two) times daily. 10 mL 11 11/27/2016 at 545  . lidocaine (LIDODERM) 5 % Place 1 patch onto the skin daily. Remove & Discard patch within 12 hours or as directed by MD 2 patch 0 11/27/2016 at 806  . lidocaine-prilocaine (EMLA) cream Apply 1 application topically See admin instructions. Apply to dialysis port on Monday, Wednesday and Friday at 10am (before dialysis)   11/27/2016 at 947  . Multiple Vitamin (MULTIVITAMIN WITH MINERALS) TABS tablet Take 1 tablet by mouth daily at 6 PM.   11/26/2016 at 1715  . Omega-3 Fatty Acids (FISH OIL) 1000 MG CAPS Take 1,000  mg by mouth 2 (two) times daily.   11/27/2016 at 545  . OXYGEN Inhale 2 L into the lungs continuous.   11/28/2016 at Unknown time  . PARoxetine (PAXIL) 40 MG tablet Take 40 mg by mouth at bedtime.    11/26/2016 at 2008  . polyethylene glycol (MIRALAX / GLYCOLAX) packet Take 17 g by mouth daily as needed (constipation). Mix in 4-8 oz liquid and drink   unknown  . pravastatin (PRAVACHOL) 20 MG tablet Take 20 mg by mouth at bedtime.    11/26/2016 at 2008  . ranitidine (ZANTAC) 150 MG tablet Take 150 mg by mouth daily at 6 PM.    11/26/2016 at 1715  . senna-docusate (SENOKOT-S) 8.6-50 MG tablet Take 2 tablets by mouth 2 (two) times daily. 60 tablet 0 11/27/2016 at 545  . traMADol (ULTRAM) 50 MG tablet Take 1 tablet (50 mg total) by mouth every 6 (six) hours as needed for moderate pain. 10 tablet 0 11/26/2016 at 1715  . vancomycin (VANCOCIN) 1-5 GM/200ML-% SOLN Inject 200 mLs (1,000 mg total) into the vein every Monday, Wednesday, and Friday with hemodialysis. She will need this for at least 12 weeks, further course to be determined by infectious disease Dr. Sampson Goon (Patient taking differently: Inject 1,000 mg into the vein See admin instructions. Order dated 11/19/16 - Give 1 gram vancomycin on Monday, Wednesday, Friday at dialysis for at least 12 weeks , further course to be determined by infectious disease Dr. Sampson Goon) 16109 mL 0 unknown  . warfarin (COUMADIN) 2.5 MG tablet Take 2.5 mg by mouth daily at 6 PM.    11/26/2016 at 1715  . [DISCONTINUED] oxyCODONE (OXY IR/ROXICODONE) 5 MG immediate release tablet Take 1 tablet (5 mg total) by mouth every 4 (four) hours as needed for severe pain. 5 tablet 0 11/27/2016 at 751    Assessment: Warfarin PTA for afib was on hold for lumbar spine aspiration. Following procedure, patient re-started on warfarin and placed on concomitant LMWH pending discharge; however, discharge delayed due to daptomycin administration. Today, INR is 1.97 and trending upward nicely. CBC  stable and no s/sx of bleeding. Will resume warfarin PTA dose.  Goal of Therapy:  INR 2-3 Monitor platelets by anticoagulation protocol: Yes AntiXa level 0.6-1 units/ml 4hrs after LMWH dose given  Plan:  Continue LMWH 1mg /kg q24h Coumadin 2.5 mg PO x1 tonight Daily CBC and INR  Hershal Coria, PharmD, MS 12/05/2016,3:43 PM

## 2016-12-05 NOTE — Progress Notes (Signed)
[facility] Progress Note    Assessment/ Plan:   Dialysis Orders: [facility] . EDW 67kgHD Bath 2k, 2.5 ca Time 3hrs Heparin 2000 bolus with infusion hourly 400 . Access RUA AVF 15 g BFR 450DFR 800 Epogen 2200Units IV/HD Venofer 50mg  weekly Hd / NO vit d listed   Assessment/ Plan:   1. Osteomyelitis/diskitis of L2-3, cultures negative to datefrom aspirate on 12/01/16, ID following and recommending IV daptomycin after HD 2. ESRD- continue [facility]TS schedule [facility] --> may convert to MWF when close to d/c. Challenging edw due to ^ BP - She actually rx HD 2 days in a row (late last night) - Hold HD today --> restart MonWedFri regimen. - Will redose the Dapto immediately (nurse notified). 3. HTN/volume- stable 4. Chronic A fib- had been on coumadin but now Lovenox. 5. DM- per primary 6. Anemia of chronic kidney disease- ESA on hold due to Hgb >12 but now down to 11.3, will restart if cont to drop. 7. Disposition- per primary, currently on hold due to pain. Another issue before discharge is that her dialysis unit, [facility] on [address] does not have daptomycin on formulary and they have to special order it. Also she will need to be able to sit in a chair for HD.   They will not have the dapto for AT LEAST another week.   Subjective:   C/o back pain, but denies f/c/n/v. Pain worse when sitting more upright.   Objective:   BP (!) 122/59 (BP Location: Left Arm)   Pulse 89   Temp (!) 97.4 F (36.3 C) (Oral)   Resp 18   Ht 5\' 5"  (1.651 m)   Wt 65.6 kg (144 lb 10 oz)   SpO2 100%   BMI 24.07 kg/m   Intake/Output Summary (Last 24 hours) at 12/05/16 1044 Last data filed at 12/05/16 [id number]  Gross per 24 hour  Intake                0 ml  Output             2004 ml  Net            -2004 ml   Weight change:   Physical Exam: Gen:NAD CVS:no  rub Resp:cta RUE:AVWUJWAbd:benign Ext:no edema, RUE AVF +T/B, but very pulsatile; appears to be a BCF that has been tunneled medially w/ a new anastomosis.    Imaging: No results found.  Labs: BMET  Recent Labs Lab 11/29/16 0213 11/30/16 0304 12/01/16 0321 12/02/16 0229 12/02/16 2003 12/03/16 0518 12/04/16 2340  NA 128* 127* 133* 132* 134* 131* 130*  K 3.8 3.8 3.7 4.0 4.3 3.8 4.9  CL 96* 94* 98* 96* 96* 95* 93*  CO2 20* 21* 24 25 26 26 26   GLUCOSE 99 108* 110* 150* 183* 174* 158*  BUN 24* 35* 17 27* 39* 19 47*  CREATININE 3.94* 5.01* 3.64* 4.81* 5.40* 3.24* 5.22*  CALCIUM 8.2* 8.1* 8.3* 8.3* 8.4* 8.2* 8.6*  PHOS  --   --   --   --  2.7  --  2.9   CBC  Recent Labs Lab 12/02/16 2003 12/03/16 0518 12/04/16 0545 12/04/16 2330  WBC 7.2 7.9 7.8 8.1  HGB 10.7* 11.3* 10.3* 9.9*  HCT 34.8* 35.9* 32.9* 31.0*  MCV 94.3 93.7 94.5 93.7  PLT 250 242 212 221    Medications:    . acetaminophen  650 mg Oral QID  .  calcium acetate  1,334 mg Oral TID WC  . collagenase   Topical Daily  . diltiazem  120 mg Oral QHS  . enoxaparin (LOVENOX) injection  1 mg/kg Subcutaneous Q24H  . famotidine  20 mg Oral QHS  . feeding supplement (ENSURE ENLIVE)  237 mL Oral BID BM  . gabapentin  300 mg Oral Once per day on Mon Wed Fri  . gabapentin  300 mg Oral QHS  . insulin aspart  0-9 Units Subcutaneous TID WC  . lidocaine  1 patch Transdermal Q24H  . multivitamin  1 tablet Oral Daily  . omega-3 acid ethyl esters  1,000 mg Oral Daily  . oxyCODONE  10 mg Oral Q12H  . PARoxetine  40 mg Oral QHS  . pravastatin  20 mg Oral QPM  . senna-docusate  2 tablet Oral BID  . Warfarin - Pharmacist Dosing Inpatient   Does not apply q1800      Paulene Floor, MD 12/05/2016, 10:44 AM

## 2016-12-05 NOTE — Progress Notes (Signed)
Physical Therapy Treatment Patient Details Name: [name] MRN: [id number] DOB: Oct 06, 1933 Today's Date: 12/05/2016    History of Present Illness  [name] is a 83 y.o. female with medical history significant of ESRD dialysis on MWF, A.Fib on coumadin, HTN.  Patient has recently been battling diskitis / osteomyelitis of L2-L3.  Aspirate in March demonstrated GPC in pairs on GS but cultures without growth.  She was put on vancomycin with dialysis.  Despite this her back pain has worsened and she [facility].  Work up there includes CT abd/pelvis which demonstrates worsening of her osteomyelitis. She is s/p disc aspiration on 12/01/16 at L2/3 level.     PT Comments    Patient making slow progress with mobility.  Patient able to ambulate 6' with RW today.  Pain continues to be limiting factor.  Encouraged relaxation techniques.   Follow Up Recommendations  SNF     Equipment Recommendations  None recommended by PT    Recommendations for Other Services       Precautions / Restrictions Precautions Precautions: Fall;Back Required Braces or Orthoses: Spinal Brace Spinal Brace: Thoracolumbosacral orthotic;Other (comment);Applied in sitting position Spinal Brace Comments: Patient declined TLSO due to pain.  Did agree to wear smaller support brace. Restrictions Weight Bearing Restrictions: No    Mobility  Bed Mobility               General bed mobility comments: Patient sitting EOB with nursing as PT entered room.  Transfers Overall transfer level: Needs assistance Equipment used: Rolling walker (2 wheeled) Transfers: Sit to/from [employer]Stand;Stand Pivot Transfers Sit to Stand: Min assist;+2 safety/equipment Stand pivot transfers: Mod assist;+2 safety/equipment       General transfer comment: Verbal cues for hand placement, and to scoot to edge of surface before standing.  Patient required min assist to power up to standing and for balance.   Once upright, patient with posterior lean.  Cues to shift weight forward.  Required moderate assist to pivot to chair, taking several shuffle steps due to increasing posterior lean.  Assist to control descent into chair.  Practiced sit <> stand x3.  Ambulation/Gait Ambulation/Gait assistance: Mod assist;+2 safety/equipment Ambulation Distance (Feet): 6 Feet (3' forward and 3' backward to chair) Assistive device: Rolling walker (2 wheeled) Gait Pattern/deviations: Step-through pattern;Decreased step length - right;Decreased step length - left;Decreased stride length;Shuffle;Leaning posteriorly Gait velocity: decreased Gait velocity interpretation: Below normal speed for age/gender General Gait Details: Cues for safe use of RW.  Mod assist to maintain balance to ambulate 3' forward and 3' backward to chair with shuffling steps.  Poor balance.   Stairs            Wheelchair Mobility    Modified Rankin (Stroke Patients Only)       Balance Overall balance assessment: Needs assistance Sitting-balance support: Feet supported;Bilateral upper extremity supported Sitting balance-Leahy Scale: Fair     Standing balance support: Bilateral upper extremity supported Standing balance-Leahy Scale: Poor Standing balance comment: needs mod assist of trunk in standing and support from RW                            Cognition Arousal/Alertness: Awake/alert Behavior During Therapy: [facility] for tasks assessed/performed;Anxious Overall Cognitive Status: Within Functional Limits for tasks assessed  Exercises      General Comments        Pertinent Vitals/Pain Pain Assessment: 0-10 Pain Score: 10-Worst pain ever Pain Location: back and left hip Pain Descriptors / Indicators: Grimacing;Guarding Pain Intervention(s): Monitored during session;Repositioned (RN had just given patient pain meds.)    Home Living                       Prior Function            PT Goals (current goals can now be found in the care plan section) Acute Rehab PT Goals Patient Stated Goal: decrease left hip/leg pain Progress towards PT goals: Progressing toward goals    Frequency    Min 3X/week      PT Plan Current plan remains appropriate    Co-evaluation              AM-PAC PT "6 Clicks" Daily Activity  Outcome Measure  Difficulty turning over in bed (including adjusting bedclothes, sheets and blankets)?: None Difficulty moving from lying on back to sitting on the side of the bed? : Total Difficulty sitting down on and standing up from a chair with arms (e.g., wheelchair, bedside commode, etc,.)?: Total Help needed moving to and from a bed to chair (including a wheelchair)?: A Lot Help needed walking in hospital room?: A Lot Help needed climbing 3-5 steps with a railing? : Total 6 Click Score: 11    End of Session Equipment Utilized During Treatment: Gait belt;Back brace Activity Tolerance: Patient limited by pain;Patient limited by fatigue Patient left: in chair;with call bell/phone within reach;with chair alarm set;with nursing/sitter in room Nurse Communication: Mobility status PT Visit Diagnosis: Muscle weakness (generalized) (M62.81);Unsteadiness on feet (R26.81);Other abnormalities of gait and mobility (R26.89);Pain Pain - Right/Left: Left Pain - part of body: Hip (back)     Time: 4098-1191 PT Time Calculation (min) (ACUTE ONLY): 11 min  Charges:  $Therapeutic Activity: 8-22 mins                    G Codes:       Durenda Hurt. Renaldo Fiddler, [facility] Acute Rehab Services Pager [phone]    Vena Austria 12/05/2016, 12:43 PM

## 2016-12-05 NOTE — Progress Notes (Signed)
HD tx completed @ 0237 w/o problem, UF goal met, blood rinsed back, called report to Rolena Infante, RN

## 2016-12-05 NOTE — Progress Notes (Signed)
Called to room by patient; she reports seeing spiders on  The walls; night shift RN reported her being disoriented to room when she returned from dialysis last night; able to reorient presently; reassurance and safety check given to patient; will notify MD.

## 2016-12-05 NOTE — Progress Notes (Addendum)
Patient came back from HD around 0400 vital signs stable no complaints of pain. Will continue to monitor.

## 2016-12-06 LAB — CBC
HEMATOCRIT: 31.3 % — AB (ref 36.0–46.0)
Hemoglobin: 9.8 g/dL — ABNORMAL LOW (ref 12.0–15.0)
MCH: 29.7 pg (ref 26.0–34.0)
MCHC: 31.3 g/dL (ref 30.0–36.0)
MCV: 94.8 fL (ref 78.0–100.0)
PLATELETS: 202 10*3/uL (ref 150–400)
RBC: 3.3 MIL/uL — ABNORMAL LOW (ref 3.87–5.11)
RDW: 17.5 % — AB (ref 11.5–15.5)
WBC: 7.9 10*3/uL (ref 4.0–10.5)

## 2016-12-06 LAB — GLUCOSE, CAPILLARY
GLUCOSE-CAPILLARY: 191 mg/dL — AB (ref 65–99)
Glucose-Capillary: 93 mg/dL (ref 65–99)

## 2016-12-06 LAB — PROTIME-INR
INR: 2
Prothrombin Time: 23 seconds — ABNORMAL HIGH (ref 11.4–15.2)

## 2016-12-06 MED ORDER — WARFARIN SODIUM 5 MG PO TABS
2.5000 mg | ORAL_TABLET | Freq: Once | ORAL | Status: AC
Start: 2016-12-06 — End: 2016-12-06
  Administered 2016-12-06: 2.5 mg via ORAL
  Filled 2016-12-06: qty 1

## 2016-12-06 NOTE — Progress Notes (Signed)
ANTICOAGULATION CONSULT NOTE - Follow Up Consult  Pharmacy Consult for coumadin/LMWH Indication: atrial fibrillation  Allergies  Allergen Reactions  . Ceftazidime Other (See Comments)    myoclonus  . Codeine Itching and Other (See Comments)    Reaction:  Confusion/Hallucinations   . Contrast Media [Iodinated Diagnostic Agents] Other (See Comments)    "skin peel" breaks into a blistered rash, then peel. If given Benadryl and solumedrol she will be okay  . Oxycodone Other (See Comments)    Reaction:  Confusion/Hallucinations   . Quinine Derivatives Rash  . Tape Other (See Comments)    Skin is very thin and will blister and pull off skin with any tape except paper tape.  PLEASE USE PAPER TAPE.    Patient Measurements: Height: 5\' 5"  (165.1 cm) Weight: 144 lb 10 oz (65.6 kg) IBW/kg (Calculated) : 57 Heparin Dosing Weight: 65.6 kg  Vital Signs: Temp: 98.4 F (36.9 C) (07/08 0500) Temp Source: Oral (07/08 0500) BP: 142/71 (07/08 0500) Pulse Rate: 71 (07/08 0500)  Labs:  Recent Labs  12/04/16 0545 12/04/16 0911 12/04/16 2330 12/04/16 2340 12/05/16 1106 12/06/16 0135  HGB 10.3*  --  9.9*  --   --  9.8*  HCT 32.9*  --  31.0*  --   --  31.3*  PLT 212  --  221  --   --  202  LABPROT 20.1*  --   --   --  22.8* 23.0*  INR 1.69  --   --   --  1.97 2.00  CREATININE  --   --   --  5.22*  --   --   CKTOTAL  --  42  --   --   --   --     Estimated Creatinine Clearance: 7.6 mL/min (A) (by C-G formula based on SCr of 5.22 mg/dL (H)).   Medications:  Prescriptions Prior to Admission  Medication Sig Dispense Refill Last Dose  . acetaminophen (TYLENOL) 650 MG CR tablet Take 1,300 mg by mouth 2 (two) times daily.   11/27/2016 at 806  . albuterol (PROVENTIL HFA;VENTOLIN HFA) 108 (90 Base) MCG/ACT inhaler Inhale 1 puff into the lungs every 6 (six) hours as needed for wheezing or shortness of breath.    unknown  . Amino Acids-Protein Hydrolys (FEEDING SUPPLEMENT, PRO-STAT SUGAR FREE  64,) LIQD Take 30 mLs by mouth 3 (three) times daily with meals.   11/27/2016 at 1329  . B Complex-C-Folic Acid (RENAL-VITE) 0.8 MG TABS Take 0.8 mg by mouth daily.   11/27/2016 at 806  . Biotin 1 MG CAPS Take 1 mg by mouth daily.    11/27/2016 at 545  . calcium acetate (PHOSLO) 667 MG tablet Take 2,001 mg by mouth 3 (three) times daily with meals. With any food   11/27/2016 at 545  . cyclobenzaprine (FLEXERIL) 5 MG tablet Take 1 tablet (5 mg total) by mouth 3 (three) times daily as needed for muscle spasms. 10 tablet 0 11/26/2016 at 1715  . diltiazem (CARDIZEM CD) 120 MG 24 hr capsule Take 120 mg by mouth daily at 6 PM.    11/26/2016 at 1715  . furosemide (LASIX) 80 MG tablet Take 80 mg by mouth 2 (two) times daily.   11/27/2016 at 545  . gabapentin (NEURONTIN) 100 MG capsule Take 100 mg by mouth See admin instructions. Take 1 capsule (100 mg) by mouth Sunday,Tuesday, Thursday, Saturday at bedtime   11/26/2016 at 2008  . gabapentin (NEURONTIN) 300 MG capsule Take 300 mg  by mouth See admin instructions. Take 1 capsule (300 mg) by mouth Monday, Wednesday, Friday at bedtime   11/25/2016 at 2045  . insulin detemir (LEVEMIR) 100 UNIT/ML injection Inject 0.06 mLs (6 Units total) into the skin 2 (two) times daily. 10 mL 11 11/27/2016 at 545  . lidocaine (LIDODERM) 5 % Place 1 patch onto the skin daily. Remove & Discard patch within 12 hours or as directed by MD 2 patch 0 11/27/2016 at 806  . lidocaine-prilocaine (EMLA) cream Apply 1 application topically See admin instructions. Apply to dialysis port on Monday, Wednesday and Friday at 10am (before dialysis)   11/27/2016 at 947  . Multiple Vitamin (MULTIVITAMIN WITH MINERALS) TABS tablet Take 1 tablet by mouth daily at 6 PM.   11/26/2016 at 1715  . Omega-3 Fatty Acids (FISH OIL) 1000 MG CAPS Take 1,000 mg by mouth 2 (two) times daily.   11/27/2016 at 545  . OXYGEN Inhale 2 L into the lungs continuous.   11/28/2016 at Unknown time  . PARoxetine (PAXIL) 40 MG tablet Take 40  mg by mouth at bedtime.    11/26/2016 at 2008  . polyethylene glycol (MIRALAX / GLYCOLAX) packet Take 17 g by mouth daily as needed (constipation). Mix in 4-8 oz liquid and drink   unknown  . pravastatin (PRAVACHOL) 20 MG tablet Take 20 mg by mouth at bedtime.    11/26/2016 at 2008  . ranitidine (ZANTAC) 150 MG tablet Take 150 mg by mouth daily at 6 PM.    11/26/2016 at 1715  . senna-docusate (SENOKOT-S) 8.6-50 MG tablet Take 2 tablets by mouth 2 (two) times daily. 60 tablet 0 11/27/2016 at 545  . traMADol (ULTRAM) 50 MG tablet Take 1 tablet (50 mg total) by mouth every 6 (six) hours as needed for moderate pain. 10 tablet 0 11/26/2016 at 1715  . vancomycin (VANCOCIN) 1-5 GM/200ML-% SOLN Inject 200 mLs (1,000 mg total) into the vein every Monday, Wednesday, and Friday with hemodialysis. She will need this for at least 12 weeks, further course to be determined by infectious disease Dr. Sampson Goon (Patient taking differently: Inject 1,000 mg into the vein See admin instructions. Order dated 11/19/16 - Give 1 gram vancomycin on Monday, Wednesday, Friday at dialysis for at least 12 weeks , further course to be determined by infectious disease Dr. Sampson Goon) 86578 mL 0 unknown  . warfarin (COUMADIN) 2.5 MG tablet Take 2.5 mg by mouth daily at 6 PM.    11/26/2016 at 1715  . [DISCONTINUED] oxyCODONE (OXY IR/ROXICODONE) 5 MG immediate release tablet Take 1 tablet (5 mg total) by mouth every 4 (four) hours as needed for severe pain. 5 tablet 0 11/27/2016 at 751    Assessment: Today INR therapeutic at 2.0 on coumadin and LMWH. Would continue LMWH 1mg /kg and coumadin until therapeutic x2. CBC stable and no s/sx of bleeding. Will continue warfarin PTA dose.   Goal of Therapy:  INR 2-3 Monitor platelets by anticoagulation protocol: Yes   Plan:  Continue LMWH 1mg /kg q24h Coumadin 2.5 mg PO x 1 tonight Daily CBC and INR  Hershal Coria, PharmD, MS 12/06/2016,8:26 AM

## 2016-12-06 NOTE — Progress Notes (Signed)
Received report that pt has 3  run of V-tach went to check on pt pt stated ok no distress note HR 84 Afib,RN will continue to monitor.

## 2016-12-06 NOTE — Progress Notes (Signed)
PROGRESS NOTE    [name]   ZOX:[id number]  DOB: [birth year]  DOA: 11/28/2016 PCP: Rolm GalaGrandis, Heidi, MD   Brief Narrative:  [name] is a 83 y.o. female with medical history significant for but not limited to ESRD dialysis on MWF, and A.Fib on coumadin who was receiving vancomycin at dialysis for discitis/osteomyelitis of L2-L3 presenting with an admitted for worsening low back pain due to worsening osteomyelitis. Patient being followed by infectious disease, started on daptomycin, post repeat aspiration.   Unable to discharge due to uncontrolled pain and inability to sit for 3 hours for HD.   Subjective: Back pain better, no fever or chills  Assessment & Plan: #1 Discitis and Osteomyelitis of lumbar spine: CT( 6/29) -worsening discitis/ osteo with severe canal stenosis Lumbar aspirate(12/01/16)- Gram stain negative - f/u Culture Continue daptomycin with hemodialysis for 6 weeks, per  ID, Dr. Orvan Falconerampbell Need to make sure that HD unit has daptomycin prior to DC  Back brace/TLSO brace ContinuePain Control  [place]itz[place]   #2 Chronic atrial fibrillation  CHA2DS2-VASc Score 6 Rate control with Cardizem High risk for CVA Cont Coumadin with Lovenox bridge till INR therapeutic  # 3 Chronic CHF: Stable 2-D echocardiogram (06/19/2015) -EF 50-55%, moderate hypertrophy Lasix discontinued  #4 hyponatremia: Per Nephrology with adjustment of dialysate prn  #5 diabetes mellitus: Levemir Sliding-scale insulin    #6 Early cirrhosis: noted on CT 11/27/16 - outpatient f/u  #7 Right middle lobe pulm nodule: stable from last imaging 13 mo ago-f/u Ct in 1 yr     DVT prophylaxis: SCDs- INR still therapeutic Code Status: DNR Family Communication: By the bedside Disposition Plan:  DC to SNF when patient's pain is better controlled, [place] [facility] [place] [facility] [place]   Consultants:    ID  IR  Nephrology Procedures:    S/P L2 -L3 fluoro guided disc aspiration 7/3   Antimicrobials:  Anti-infectives    Start     Dose/Rate Route Frequency Ordered Stop   12/05/16 0000  DAPTOmycin 500 mg in sodium chloride 0.9 % 100 mL     500 mg 220 mL/hr over 30 Minutes Intravenous Every 48 hours 12/04/16 0822 01/14/17 2359   12/04/16 0000  DAPTOmycin 500 mg in sodium chloride 0.9 % 100 mL  Status:  Discontinued    Comments:  Daptomycin to be administered after hemodialysis   500 mg 220 mL/hr over 30 Minutes Intravenous Every M-W-F (Hemodialysis) 12/03/16 0830 12/04/16    12/03/16 1500  DAPTOmycin (CUBICIN) 500 mg in sodium chloride 0.9 % IVPB  Status:  Discontinued     500 mg 220 mL/hr over 30 Minutes Intravenous Every 48 hours 12/01/16 1559 12/01/16 1614   12/03/16 1400  DAPTOmycin (CUBICIN) 500 mg in sodium chloride 0.9 % IVPB     500 mg 220 mL/hr over 30 Minutes Intravenous Every 48 hours 12/03/16 1222     12/01/16 1700  DAPTOmycin (CUBICIN) 500 mg in sodium chloride 0.9 % IVPB  Status:  Discontinued     500 mg 220 mL/hr over 30 Minutes Intravenous Every 48 hours 12/01/16 1614 12/03/16 1222   12/01/16 1500  DAPTOmycin (CUBICIN) 523 mg in sodium chloride 0.9 % IVPB  Status:  Discontinued     8 mg/kg  65.4 kg 220.9 mL/hr over 30 Minutes Intravenous Every 48 hours 12/01/16 1217 12/01/16 1559       Objective:No fever or chills.  Vitals:   12/05/16 1817 12/05/16 2135 12/06/16 0500  12/06/16 0859  BP: (!) 164/61 (!) 158/68 (!) 142/71 (!) 116/50  Pulse: 70 68 71 84  Resp: 20 18 16 16   Temp: 98.7 F (37.1 C) 98.6 F (37 C) 98.4 F (36.9 C) 97.7 F (36.5 C)  TempSrc: Oral Oral Oral Oral  SpO2: 100% 100% 99%   Weight:      Height:        Intake/Output Summary (Last 24 hours) at 12/06/16 1128 Last data filed at 12/06/16 0857  Gross per 24 hour  Intake              470 ml  Output                0 ml  Net              470 ml   Filed Weights   12/04/16 1758  12/04/16 2323 12/05/16 0305  Weight: 68.1 kg (150 lb 2.1 oz) 67.6 kg (149 lb 0.5 oz) 65.6 kg (144 lb 10 oz)    Examination: General exam:No acute distress HEENT: PERRLA, oral mucosa moist, no sclera icterus or thrush Respiratory system: Clear to auscultation. Respiratory effort normal. Cardiovascular system: S1 & S2 heard, RRR.  No murmurs  Gastrointestinal system: Abdomen soft, non-tender, nondistended. Normal bowel sound. No organomegaly Central nervous system: Alert and oriented. No focal neurological deficits. Extremities: No cyanosis, clubbing or edema MSK- tenderness in lower back noted.  Skin: No rashes or ulcers Psychiatry:  Mood & affect appropriate.     Data Reviewed: I have personally reviewed following labs and imaging studies  CBC:  Recent Labs Lab 12/02/16 2003 12/03/16 0518 12/04/16 0545 12/04/16 2330 12/06/16 0135  WBC 7.2 7.9 7.8 8.1 7.9  HGB 10.7* 11.3* 10.3* 9.9* 9.8*  HCT 34.8* 35.9* 32.9* 31.0* 31.3*  MCV 94.3 93.7 94.5 93.7 94.8  PLT 250 242 212 221 202   Basic Metabolic Panel:  Recent Labs Lab 12/01/16 0321 12/02/16 0229 12/02/16 2003 12/03/16 0518 12/04/16 2340  NA 133* 132* 134* 131* 130*  K 3.7 4.0 4.3 3.8 4.9  CL 98* 96* 96* 95* 93*  CO2 24 25 26 26 26   GLUCOSE 110* 150* 183* 174* 158*  BUN 17 27* 39* 19 47*  CREATININE 3.64* 4.81* 5.40* 3.24* 5.22*  CALCIUM 8.3* 8.3* 8.4* 8.2* 8.6*  PHOS  --   --  2.7  --  2.9   GFR: Estimated Creatinine Clearance: 7.6 mL/min (A) (by C-G formula based on SCr of 5.22 mg/dL (H)). Liver Function Tests:  Recent Labs Lab 12/02/16 2003 12/03/16 0518 12/04/16 2340  AST  --  20  --   ALT  --  12*  --   ALKPHOS  --  73  --   BILITOT  --  0.6  --   PROT  --  6.4*  --   ALBUMIN 2.5* 2.8* 2.6*   No results for input(s): LIPASE, AMYLASE in the last 168 hours. No results for input(s): AMMONIA in the last 168 hours. Coagulation Profile:  Recent Labs Lab 12/02/16 0229 12/03/16 0518  12/04/16 0545 12/05/16 1106 12/06/16 0135  INR 1.17 1.37 1.69 1.97 2.00   Cardiac Enzymes:  Recent Labs Lab 12/01/16 1416 12/04/16 0911  CKTOTAL  --  42  CKMB 0  --    BNP (last 3 results) No results for input(s): PROBNP in the last 8760 hours. HbA1C: No results for input(s): HGBA1C in the last 72 hours. CBG:  Recent Labs Lab 12/05/16 0614 12/05/16 1145 12/05/16  1640 12/05/16 2137 12/06/16 0620  GLUCAP 171* 134* 164* 93 93   Lipid Profile: No results for input(s): CHOL, HDL, LDLCALC, TRIG, CHOLHDL, LDLDIRECT in the last 72 hours. Thyroid Function Tests: No results for input(s): TSH, T4TOTAL, FREET4, T3FREE, THYROIDAB in the last 72 hours. Anemia Panel: No results for input(s): VITAMINB12, FOLATE, FERRITIN, TIBC, IRON, RETICCTPCT in the last 72 hours. Urine analysis:    Component Value Date/Time   COLORURINE AMBER (A) 08/09/2016 2204   APPEARANCEUR TURBID (A) 08/09/2016 2204   LABSPEC 1.014 08/09/2016 2204   PHURINE 6.0 08/09/2016 2204   GLUCOSEU NEGATIVE 08/09/2016 2204   HGBUR MODERATE (A) 08/09/2016 2204   BILIRUBINUR NEGATIVE 08/09/2016 2204   KETONESUR NEGATIVE 08/09/2016 2204   PROTEINUR 100 (A) 08/09/2016 2204   NITRITE NEGATIVE 08/09/2016 2204   LEUKOCYTESUR LARGE (A) 08/09/2016 2204   Sepsis Labs: @LABRCNTIP (procalcitonin:4,lacticidven:4) ) Recent Results (from the past 240 hour(s))  Body fluid culture     Status: None   Collection Time: 12/01/16  8:50 AM  Result Value Ref Range Status   Specimen Description FLUID  Final   Special Requests L2 3 DISC ASPIRATE SENT 2 SYRINGES  Final   Gram Stain   Final    RARE WBC PRESENT, PREDOMINANTLY MONONUCLEAR NO ORGANISMS SEEN    Culture NO GROWTH 3 DAYS  Final   Report Status 12/05/2016 FINAL  Final         Radiology Studies: No results found.    Scheduled Meds: . acetaminophen  650 mg Oral QID  . calcium acetate  1,334 mg Oral TID WC  . collagenase   Topical Daily  . diltiazem  120 mg  Oral QHS  . enoxaparin (LOVENOX) injection  1 mg/kg Subcutaneous Q24H  . famotidine  20 mg Oral QHS  . feeding supplement (ENSURE ENLIVE)  237 mL Oral BID BM  . gabapentin  300 mg Oral Once per day on Mon Wed Fri  . gabapentin  300 mg Oral QHS  . insulin aspart  0-9 Units Subcutaneous TID WC  . insulin detemir  10 Units Subcutaneous QHS  . lidocaine  1 patch Transdermal Q24H  . multivitamin  1 tablet Oral Daily  . omega-3 acid ethyl esters  1,000 mg Oral Daily  . oxyCODONE  10 mg Oral Q12H  . PARoxetine  40 mg Oral QHS  . pravastatin  20 mg Oral QPM  . senna-docusate  2 tablet Oral BID  . warfarin  2.5 mg Oral ONCE-1800  . Warfarin - Pharmacist Dosing Inpatient   Does not apply q1800   Continuous Infusions: . sodium chloride    . sodium chloride    . DAPTOmycin (CUBICIN)  IV Stopped (12/05/16 1123)     LOS: 8 days    Time spent in minutes: 21    OSEI-BONSU,Jeorgia Helming, MD [facility] Pager: [phone] [email or web address] Password [email or web address] 12/06/2016, 11:28 AM

## 2016-12-06 NOTE — Progress Notes (Signed)
[facility] Progress Note    Assessment/ Plan:   Dialysis Orders: [facility] [place] on MWF . EDW 67kgHD Bath 2k, 2.5 ca Time 3hrs Heparin 2000 bolus with infusion hourly 400 . Access RUA AVF 15 g BFR 450DFR 800 Epogen 2200Units IV/HD Venofer 50mg  weekly Hd / NO vit d listed   Assessment/ Plan:   1. Osteomyelitis/diskitis of L2-3, cultures negative to datefrom aspirate on 12/01/16, ID following and recommending IV daptomycin after HD 2. ESRD- [facility] --> may convert to MWF when close to d/c. Challenging edw due to ^ BP - She actually rx HD 2 days in a row (late last Fri evening) - Held HD Sat  --> restart MonWedFri regimen. - Last Dapto dose on 7/8. - Plan for HD Mon. 3. HTN/volume- stable 4. Chronic A fib- had been on coumadin but now Lovenox. 5. DM- per primary 6. Anemia of chronic kidney disease- ESA on hold due to Hgb >12 but now down to 11.3, will restart if cont to drop. 7. Disposition- per primary, currently on hold due to pain. Another issue before discharge is that her dialysis unit, [facility]  [address] does not have daptomycin on formulary and they have to special order it. Also she will need to be able to sit in a chair for HD.   They will not have the dapto for AT LEAST another week.   Subjective:   C/o back pain, but denies f/c/n/v. Pain worse when sitting more upright. Denies dyspnea    Objective:   BP (!) 116/50 (BP Location: Left Arm) Comment: RN Notified  Pulse 84   Temp 97.7 F (36.5 C) (Oral)   Resp 16   Ht 5\' 5"  (1.651 m)   Wt 65.6 kg (144 lb 10 oz)   SpO2 99%   BMI 24.07 kg/m   Intake/Output Summary (Last 24 hours) at 12/06/16 1005 Last data filed at 12/06/16 0857  Gross per 24 hour  Intake              470 ml  Output                0 ml  Net              470 ml   Weight change:   Physical Exam: Gen:NAD CVS:no  rub Resp:cta UEA:VWUJWJAbd:benign Ext:no edema, RUE AVF +T/B, but very pulsatile; appears to be a BCF that has been tunneled medially w/ a new anastomosis - decent bruit (7/8).   Imaging: No results found.  Labs: BMET  Recent Labs Lab 11/30/16 0304 12/01/16 0321 12/02/16 0229 12/02/16 2003 12/03/16 0518 12/04/16 2340  NA 127* 133* 132* 134* 131* 130*  K 3.8 3.7 4.0 4.3 3.8 4.9  CL 94* 98* 96* 96* 95* 93*  CO2 21* 24 25 26 26 26   GLUCOSE 108* 110* 150* 183* 174* 158*  BUN 35* 17 27* 39* 19 47*  CREATININE 5.01* 3.64* 4.81* 5.40* 3.24* 5.22*  CALCIUM 8.1* 8.3* 8.3* 8.4* 8.2* 8.6*  PHOS  --   --   --  2.7  --  2.9   CBC  Recent Labs Lab 12/03/16 0518 12/04/16 0545 12/04/16 2330 12/06/16 0135  WBC 7.9 7.8 8.1 7.9  HGB 11.3* 10.3* 9.9* 9.8*  HCT 35.9* 32.9* 31.0* 31.3*  MCV 93.7 94.5 93.7 94.8  PLT 242 212 221 202    Medications:    . acetaminophen  650 mg Oral QID  .  calcium acetate  1,334 mg Oral TID WC  . collagenase   Topical Daily  . diltiazem  120 mg Oral QHS  . enoxaparin (LOVENOX) injection  1 mg/kg Subcutaneous Q24H  . famotidine  20 mg Oral QHS  . feeding supplement (ENSURE ENLIVE)  237 mL Oral BID BM  . gabapentin  300 mg Oral Once per day on Mon Wed Fri  . gabapentin  300 mg Oral QHS  . insulin aspart  0-9 Units Subcutaneous TID WC  . insulin detemir  10 Units Subcutaneous QHS  . lidocaine  1 patch Transdermal Q24H  . multivitamin  1 tablet Oral Daily  . omega-3 acid ethyl esters  1,000 mg Oral Daily  . oxyCODONE  10 mg Oral Q12H  . PARoxetine  40 mg Oral QHS  . pravastatin  20 mg Oral QPM  . senna-docusate  2 tablet Oral BID  . warfarin  2.5 mg Oral ONCE-1800  . Warfarin - Pharmacist Dosing Inpatient   Does not apply q1800      Paulene Floor, MD 12/06/2016, 10:05 AM

## 2016-12-07 DIAGNOSIS — E0800 Diabetes mellitus due to underlying condition with hyperosmolarity without nonketotic hyperglycemic-hyperosmolar coma (NKHHC): Secondary | ICD-10-CM

## 2016-12-07 LAB — BASIC METABOLIC PANEL
Anion gap: 10 (ref 5–15)
BUN: 40 mg/dL — ABNORMAL HIGH (ref 6–20)
CHLORIDE: 96 mmol/L — AB (ref 101–111)
CO2: 27 mmol/L (ref 22–32)
CREATININE: 4.95 mg/dL — AB (ref 0.44–1.00)
Calcium: 8.6 mg/dL — ABNORMAL LOW (ref 8.9–10.3)
GFR calc non Af Amer: 7 mL/min — ABNORMAL LOW (ref >60)
GFR, EST AFRICAN AMERICAN: 9 mL/min — AB (ref >60)
Glucose, Bld: 159 mg/dL — ABNORMAL HIGH (ref 65–99)
POTASSIUM: 4.8 mmol/L (ref 3.5–5.1)
SODIUM: 133 mmol/L — AB (ref 135–145)

## 2016-12-07 LAB — CBC
HEMATOCRIT: 31.4 % — AB (ref 36.0–46.0)
HEMOGLOBIN: 9.8 g/dL — AB (ref 12.0–15.0)
MCH: 29.8 pg (ref 26.0–34.0)
MCHC: 31.2 g/dL (ref 30.0–36.0)
MCV: 95.4 fL (ref 78.0–100.0)
Platelets: 197 10*3/uL (ref 150–400)
RBC: 3.29 MIL/uL — AB (ref 3.87–5.11)
RDW: 17.7 % — ABNORMAL HIGH (ref 11.5–15.5)
WBC: 8.4 10*3/uL (ref 4.0–10.5)

## 2016-12-07 LAB — GLUCOSE, CAPILLARY
GLUCOSE-CAPILLARY: 93 mg/dL (ref 65–99)
GLUCOSE-CAPILLARY: 82 mg/dL (ref 65–99)
GLUCOSE-CAPILLARY: 149 mg/dL — AB (ref 65–99)
Glucose-Capillary: 119 mg/dL — ABNORMAL HIGH (ref 65–99)
Glucose-Capillary: 95 mg/dL (ref 65–99)
Glucose-Capillary: 207 mg/dL — ABNORMAL HIGH (ref 65–99)

## 2016-12-07 LAB — MAGNESIUM: MAGNESIUM: 2.2 mg/dL (ref 1.7–2.4)

## 2016-12-07 LAB — PROTIME-INR
INR: 2.33
Prothrombin Time: 26 seconds — ABNORMAL HIGH (ref 11.4–15.2)

## 2016-12-07 MED ORDER — WARFARIN SODIUM 5 MG PO TABS
2.5000 mg | ORAL_TABLET | Freq: Every day | ORAL | Status: DC
  Administered 2016-12-07 – 2016-12-09 (×3): 2.5 mg via ORAL
  Filled 2016-12-07 (×3): qty 1

## 2016-12-07 MED ORDER — GABAPENTIN 100 MG PO CAPS
100.0000 mg | ORAL_CAPSULE | ORAL | Status: DC
  Administered 2016-12-08: 100 mg via ORAL
  Filled 2016-12-07 (×2): qty 1

## 2016-12-07 MED ORDER — OXYCODONE HCL 5 MG PO TABS
5.0000 mg | ORAL_TABLET | ORAL | 0 refills | Status: DC | PRN
Start: 2016-12-07 — End: 2016-12-10

## 2016-12-07 MED ORDER — DARBEPOETIN ALFA 60 MCG/0.3ML IJ SOSY
PREFILLED_SYRINGE | INTRAMUSCULAR | Status: AC
  Administered 2016-12-07: 60 ug via INTRAVENOUS
  Filled 2016-12-07: qty 0.3

## 2016-12-07 MED ORDER — TRAMADOL HCL 50 MG PO TABS
ORAL_TABLET | ORAL | Status: AC
  Administered 2016-12-07: 50 mg
  Filled 2016-12-07: qty 1

## 2016-12-07 MED ORDER — NALOXONE HCL 0.4 MG/ML IJ SOLN
0.4000 mg | INTRAMUSCULAR | Status: DC | PRN
  Administered 2016-12-07: 0.4 mg via INTRAVENOUS
  Filled 2016-12-07: qty 1

## 2016-12-07 MED ORDER — DARBEPOETIN ALFA 60 MCG/0.3ML IJ SOSY
60.0000 ug | PREFILLED_SYRINGE | INTRAMUSCULAR | Status: DC
  Administered 2016-12-07: 60 ug via INTRAVENOUS

## 2016-12-07 MED ORDER — GABAPENTIN 600 MG PO TABS: 100.0000 mg | ORAL_TABLET | Freq: Every day | ORAL | Status: DC

## 2016-12-07 NOTE — Progress Notes (Signed)
PROGRESS NOTE    ALANTA SCOBEY   ZOX:[id number]  DOB: 1934-01-01  DOA: 11/28/2016 PCP: [name], MD   Brief Narrative:  [name] is a 83 y.o. female with medical history significant of ESRD dialysis on MWF, A.Fib on coumadin, HTN.  Patient is being treated for diskitis / osteomyelitis of L2-L3 with Vancomycin with dialysis. Aspirate in March demonstrated GPC in pairs on GS but cultures without growth. Despite treatment, her back pain has worsened and she presented to the ED at [facility].    CT reveals worsening L2-L3 discitis/ osteomyelitis with severe canal stenosis.  She was seen by ID on 6/29. They recommended to hold all antibiotics for a repeat aspiration of disc space and culture as soon as possible. Post aspiration started on daptomycin. Unable to discharge due to uncontrolled pain and inability to sit for 3 hours for HD.   Subjective: Seems groggy and somewhat confused  Assessment & Plan:  HOSPITAL COURSE:   Discitis and Osteomyelitis of lumbar spine  worsening L2-L3 discitis/ osteomyelitis with severe canal stenosis on CT on 6/29 consulted  ID - previously on Vanc,  started daptomycin 7/3 , follow-up cultures of lumbar aspirate, Gram stain continue to be negative, continue daptomycin with hemodialysis for 6 weeks as per Dr. Orvan Falconer Need to make sure that HD unit has daptomycin prior to DC  Status post L2-L3 disc aspiration on 12/01/16 needs to wear back brace which she has been resistant to wear- Patient encouraged to wear   TLSO brace Continue current pain control-pain is improving, dc OxyContin due to patient being groggy ID Dr Algis Liming had ordered ankle-brachial index study on 11/29/16, which was abnormal,Right TBI of 0.14 and left TBI of 0.36 is suggestive of abnormal arterial flow at rest, Unable to obtain bilateral ABI's due to non-compressible arteries likely due to  calcification Patient will need follow-up with with [facility] Sampson [facility]   Chronic  atrial fibrillation  -CHA2DS2-VASc Score at least 6 -Coumadin held for aspiration of disc space- placed initially oniv Heparin until INR therapeutic  high risk for CVA cont Cardizem  INR now therapeutic 2.33   Anticoagulation management Continue Coumadin per pharmacy, INR therapeutic    ESRD on dialysis , currently Tuesday Thursday Saturday Nephrology converted to Monday Wednesday Friday schedule HD 7/9  Early cirrhosis - noted on CT 6/29  Right middle lobe pulm nodules - stable from last imaging 13 mo ago-f/u Ct in 1 yr  dCHF- chronic Patient was onlasix 80 BID as outpt - does not make urine and therefore discontinued - mod conc hypertrophy on last ECHO 1/17  Diabetes mellitus 2 Resume Levemir  Hypokalemia - resolved  Hyponatremia - correct with dialysis / per nephro  Right sciatic nerve stimulator    DVT prophylaxis: SCDs- INR still therapeutic Code Status: DNR Family Communication: By the bedside Disposition Plan:  DC to SNF when  daptomycin available at HD center in [place]   Consultants:   ID  IR  Nephrology Procedures:    S/P L2 -L3 fluoro guided disc aspiration 7/3   Antimicrobials:  Anti-infectives    Start     Dose/Rate Route Frequency Ordered Stop   12/05/16 0000  DAPTOmycin 500 mg in sodium chloride 0.9 % 100 mL     500 mg 220 mL/hr over 30 Minutes Intravenous Every 48 hours 12/04/16 0822 01/14/17 2359   12/04/16 0000  DAPTOmycin 500 mg in sodium chloride 0.9 % 100 mL  Status:  Discontinued    Comments:  Daptomycin to be administered after hemodialysis   500 mg 220 mL/hr over 30 Minutes Intravenous Every M-W-F (Hemodialysis) 12/03/16 0830 12/04/16    12/03/16 1500  DAPTOmycin (CUBICIN) 500 mg in sodium chloride 0.9 % IVPB  Status:  Discontinued     500 mg 220 mL/hr over 30 Minutes Intravenous Every 48 hours 12/01/16 1559 12/01/16 1614   12/03/16 1400  DAPTOmycin (CUBICIN) 500 mg in sodium chloride 0.9 % IVPB      500 mg 220 mL/hr over 30 Minutes Intravenous Every 48 hours 12/03/16 1222     12/01/16 1700  DAPTOmycin (CUBICIN) 500 mg in sodium chloride 0.9 % IVPB  Status:  Discontinued     500 mg 220 mL/hr over 30 Minutes Intravenous Every 48 hours 12/01/16 1614 12/03/16 1222   12/01/16 1500  DAPTOmycin (CUBICIN) 523 mg in sodium chloride 0.9 % IVPB  Status:  Discontinued     8 mg/kg  65.4 kg 220.9 mL/hr over 30 Minutes Intravenous Every 48 hours 12/01/16 1217 12/01/16 1559       Objective: Vitals:   12/06/16 2034 12/07/16 0101 12/07/16 0614 12/07/16 0911  BP: (!) 119/56 130/61 (!) 120/58 (!) 160/60  Pulse: 84 75 80 85  Resp: 18 18 18 20   Temp: 97.6 F (36.4 C) 98.4 F (36.9 C) 98.2 F (36.8 C) 97.9 F (36.6 C)  TempSrc: Oral Oral Oral Oral  SpO2: 100% 100% 100% 96%  Weight:      Height:        Intake/Output Summary (Last 24 hours) at 12/07/16 1033 Last data filed at 12/07/16 1016  Gross per 24 hour  Intake              600 ml  Output                0 ml  Net              600 ml   Filed Weights   12/04/16 1758 12/04/16 2323 12/05/16 0305  Weight: 68.1 kg (150 lb 2.1 oz) 67.6 kg (149 lb 0.5 oz) 65.6 kg (144 lb 10 oz)    Examination: General exam:Continues to have a lot of pain with minimal movement HEENT: PERRLA, oral mucosa moist, no sclera icterus or thrush Respiratory system: Clear to auscultation. Respiratory effort normal. Cardiovascular system: S1 & S2 heard, RRR.  No murmurs  Gastrointestinal system: Abdomen soft, non-tender, nondistended. Normal bowel sound. No organomegaly Central nervous system: Alert and oriented. No focal neurological deficits. Extremities: No cyanosis, clubbing or edema MSK- tenderness in lower back noted.  Skin: No rashes or ulcers Psychiatry:  Mood & affect appropriate.     Data Reviewed: I have personally reviewed following labs and imaging studies  CBC:  Recent Labs Lab 12/03/16 0518 12/04/16 0545 12/04/16 2330 12/06/16 0135  12/07/16 0319  WBC 7.9 7.8 8.1 7.9 8.4  HGB 11.3* 10.3* 9.9* 9.8* 9.8*  HCT 35.9* 32.9* 31.0* 31.3* 31.4*  MCV 93.7 94.5 93.7 94.8 95.4  PLT 242 212 221 202 197   Basic Metabolic Panel:  Recent Labs Lab 12/02/16 0229 12/02/16 2003 12/03/16 0518 12/04/16 2340 12/07/16 0027  NA 132* 134* 131* 130* 133*  K 4.0 4.3 3.8 4.9 4.8  CL 96* 96* 95* 93* 96*  CO2 25 26 26 26 27   GLUCOSE 150* 183* 174* 158* 159*  BUN 27* 39* 19 47* 40*  CREATININE 4.81* 5.40* 3.24* 5.22* 4.95*  CALCIUM 8.3* 8.4* 8.2* 8.6* 8.6*  MG  --   --   --   --  2.2  PHOS  --  2.7  --  2.9  --    GFR: Estimated Creatinine Clearance: 8 mL/min (A) (by C-G formula based on SCr of 4.95 mg/dL (H)). Liver Function Tests:  Recent Labs Lab 12/02/16 2003 12/03/16 0518 12/04/16 2340  AST  --  20  --   ALT  --  12*  --   ALKPHOS  --  73  --   BILITOT  --  0.6  --   PROT  --  6.4*  --   ALBUMIN 2.5* 2.8* 2.6*   No results for input(s): LIPASE, AMYLASE in the last 168 hours. No results for input(s): AMMONIA in the last 168 hours. Coagulation Profile:  Recent Labs Lab 12/03/16 0518 12/04/16 0545 12/05/16 1106 12/06/16 0135 12/07/16 0319  INR 1.37 1.69 1.97 2.00 2.33   Cardiac Enzymes:  Recent Labs Lab 12/01/16 1416 12/04/16 0911  CKTOTAL  --  42  CKMB 0  --    BNP (last 3 results) No results for input(s): PROBNP in the last 8760 hours. HbA1C: No results for input(s): HGBA1C in the last 72 hours. CBG:  Recent Labs Lab 12/06/16 0620 12/06/16 1138 12/06/16 1655 12/06/16 2037 12/07/16 0617  GLUCAP 93 119* 191* 93 82   Lipid Profile: No results for input(s): CHOL, HDL, LDLCALC, TRIG, CHOLHDL, LDLDIRECT in the last 72 hours. Thyroid Function Tests: No results for input(s): TSH, T4TOTAL, FREET4, T3FREE, THYROIDAB in the last 72 hours. Anemia Panel: No results for input(s): VITAMINB12, FOLATE, FERRITIN, TIBC, IRON, RETICCTPCT in the last 72 hours. Urine analysis:    Component Value Date/Time    COLORURINE AMBER (A) 08/09/2016 2204   APPEARANCEUR TURBID (A) 08/09/2016 2204   LABSPEC 1.014 08/09/2016 2204   PHURINE 6.0 08/09/2016 2204   GLUCOSEU NEGATIVE 08/09/2016 2204   HGBUR MODERATE (A) 08/09/2016 2204   BILIRUBINUR NEGATIVE 08/09/2016 2204   KETONESUR NEGATIVE 08/09/2016 2204   PROTEINUR 100 (A) 08/09/2016 2204   NITRITE NEGATIVE 08/09/2016 2204   LEUKOCYTESUR LARGE (A) 08/09/2016 2204   Sepsis Labs: @LABRCNTIP (procalcitonin:4,lacticidven:4) ) Recent Results (from the past 240 hour(s))  Body fluid culture     Status: None   Collection Time: 12/01/16  8:50 AM  Result Value Ref Range Status   Specimen Description FLUID  Final   Special Requests L2 3 DISC ASPIRATE SENT 2 SYRINGES  Final   Gram Stain   Final    RARE WBC PRESENT, PREDOMINANTLY MONONUCLEAR NO ORGANISMS SEEN    Culture NO GROWTH 3 DAYS  Final   Report Status 12/05/2016 FINAL  Final         Radiology Studies: No results found.    Scheduled Meds: . acetaminophen  650 mg Oral QID  . calcium acetate  1,334 mg Oral TID WC  . collagenase   Topical Daily  . diltiazem  120 mg Oral QHS  . enoxaparin (LOVENOX) injection  1 mg/kg Subcutaneous Q24H  . famotidine  20 mg Oral QHS  . feeding supplement (ENSURE ENLIVE)  237 mL Oral BID BM  . gabapentin  300 mg Oral Once per day on Mon Wed Fri  . gabapentin  300 mg Oral QHS  . insulin aspart  0-9 Units Subcutaneous TID WC  . insulin detemir  10 Units Subcutaneous QHS  . lidocaine  1 patch Transdermal Q24H  . multivitamin  1 tablet Oral Daily  . omega-3 acid ethyl esters  1,000 mg Oral Daily  . oxyCODONE  10 mg Oral Q12H  . PARoxetine  40 mg Oral QHS  . pravastatin  20 mg Oral QPM  . senna-docusate  2 tablet Oral BID  . Warfarin - Pharmacist Dosing Inpatient   Does not apply q1800   Continuous Infusions: . sodium chloride    . sodium chloride    . DAPTOmycin (CUBICIN)  IV Stopped (12/05/16 1123)     LOS: 9 days    Time spent in minutes:  35    Richarda Overlie, MD [facility] Pager: [email or web address] Password [email or web address] 12/07/2016, 10:33 AM

## 2016-12-07 NOTE — Progress Notes (Signed)
PT Cancellation Note  Patient Details Name: [name] MRN: [id number] DOB: 11-30-1933   Cancelled Treatment:    Reason Eval/Treat Not Completed: Patient at procedure or test/unavailable; at HD   [facility] Center[facility] J Jonanthan Bolender 12/07/2016, 1:50 PM Charlotte Crumbevon Kessler Kopinski, PT DPT [facility] [phone]

## 2016-12-07 NOTE — Progress Notes (Signed)
Pt daughter, Elita Quickam, in room at present.  Pt continues to believe that the RN, staff and her daughter are working against her to hurt her.  She states she sees cats in the window while pointing to the sink.  Pt daughter states that she has experienced this type of confusion before but is concerned.  Will continue to offer support and reassurance.

## 2016-12-07 NOTE — Progress Notes (Signed)
Pt returned from dialysis very emotional, confused and delusional.  States "they left me in an old house for hours."  Attempted to offer support.  Dr. Susie CassetteAbrol notified.  Orders for narcan x 1 received and given.  Pt refused any medication until talking with her daughter Elita Quickam.  I made call and her daughter told her to take the medicines prescribed.  During the conversation pt stated "she (speaking of RN) is in cahoots with the rest of them here!"  Pt does not want to be left alone.  She feels unsafe despite efforts to console her and give her support and comfort.

## 2016-12-07 NOTE — Procedures (Signed)
I was present at this dialysis session. I have reviewed the session itself and made appropriate changes.   Filed Weights   12/04/16 1758 12/04/16 2323 12/05/16 0305  Weight: 68.1 kg (150 lb 2.1 oz) 67.6 kg (149 lb 0.5 oz) 65.6 kg (144 lb 10 oz)     Recent Labs Lab 12/04/16 2340 12/07/16 0027  NA 130* 133*  K 4.9 4.8  CL 93* 96*  CO2 26 27  GLUCOSE 158* 159*  BUN 47* 40*  CREATININE 5.22* 4.95*  CALCIUM 8.6* 8.6*  PHOS 2.9  --      Recent Labs Lab 12/04/16 2330 12/06/16 0135 12/07/16 0319  WBC 8.1 7.9 8.4  HGB 9.9* 9.8* 9.8*  HCT 31.0* 31.3* 31.4*  MCV 93.7 94.8 95.4  PLT 221 202 197    Scheduled Meds: . acetaminophen  650 mg Oral QID  . calcium acetate  1,334 mg Oral TID WC  . collagenase   Topical Daily  . diltiazem  120 mg Oral QHS  . enoxaparin (LOVENOX) injection  1 mg/kg Subcutaneous Q24H  . famotidine  20 mg Oral QHS  . feeding supplement (ENSURE ENLIVE)  237 mL Oral BID BM  . gabapentin  300 mg Oral Once per day on Mon Wed Fri  . gabapentin  300 mg Oral QHS  . insulin aspart  0-9 Units Subcutaneous TID WC  . insulin detemir  10 Units Subcutaneous QHS  . lidocaine  1 patch Transdermal Q24H  . multivitamin  1 tablet Oral Daily  . omega-3 acid ethyl esters  1,000 mg Oral Daily  . oxyCODONE  10 mg Oral Q12H  . PARoxetine  40 mg Oral QHS  . pravastatin  20 mg Oral QPM  . senna-docusate  2 tablet Oral BID  . Warfarin - Pharmacist Dosing Inpatient   Does not apply q1800   Continuous Infusions: . sodium chloride    . sodium chloride    . DAPTOmycin (CUBICIN)  IV Stopped (12/05/16 1123)   PRN Meds:.sodium chloride, sodium chloride, albuterol, alteplase, cyclobenzaprine, heparin, lidocaine (PF), lidocaine-prilocaine, oxyCODONE, pentafluoroprop-tetrafluoroeth, polyethylene glycol, traMADol    Dialysis Access: RUA AVF Pos. Bruit   [facility]: [facility] [place] [facility] [place] . EDW 67kgHD Bath 2k, 2.5 ca Time 3hrs Heparin  2000 bolus with infusion hourly 400 . Access RUA AVF 15 g BFR 450DFR 800 Epogen 2200Units IV/HD Venofer 50mg  weekly Hd / NO vit d listed   Assessment/Plan: 1. Osteomyelitis/diskitis of L2-3, cultures negative to date from aspirate on 12/01/16, ID following and recommending IV daptomycin after HD 2. ESRD- [facility] Challenging edw due to ^ BP 3. HTN/volume- stable 4. Chronic A fib- on coumadin 5. DM- per primary 6. Anemia of chronic kidney disease- ESA was on hold due to Hgb >12 but now down to 9.8, will restart. 7. Disposition- per primary, currently on hold due to pain as well as ability to sit in recliner for more than 3 hours and the availability of Daptomycin at her outpatient dialysis center.    Irena CordsJoseph A Nayra Coury,  MD 12/07/2016, 12:16 PM

## 2016-12-07 NOTE — Progress Notes (Signed)
Plan is for patient to return to [facility] once patient is able to tolerate sitting for greater than 3 hours and when [facility] has her Daptomycin in stock. CM called [facility] today and they stated that the Daptomycin is ordered but could not say when the medication would be available at the center for the patient. [facility] asked that we call 12/08/16 and speak to Carollee HerterShannon [phone] about the availability of the Daptomycin.

## 2016-12-07 NOTE — Progress Notes (Signed)
ANTICOAGULATION CONSULT NOTE - Follow Up Consult  Pharmacy Consult for Coumadin and Lovenox Indication: atrial fibrillation  Allergies  Allergen Reactions  . Ceftazidime Other (See Comments)    myoclonus  . Codeine Itching and Other (See Comments)    Reaction:  Confusion/Hallucinations   . Contrast Media [Iodinated Diagnostic Agents] Other (See Comments)    "skin peel" breaks into a blistered rash, then peel. If given Benadryl and solumedrol she will be okay  . Oxycodone Other (See Comments)    Reaction:  Confusion/Hallucinations   . Quinine Derivatives Rash  . Tape Other (See Comments)    Skin is very thin and will blister and pull off skin with any tape except paper tape.  PLEASE USE PAPER TAPE.    Patient Measurements: Height: 5\' 5"  (165.1 cm) Weight: (P)  (uto- pt in recliner and too unstable to stand on scale.) IBW/kg (Calculated) : 57  Last weight: 65.6 kg on 12/05/16  Vital Signs: Temp: (P) 97.1 F (36.2 C) (07/09 1155) Temp Source: (P) Oral (07/09 1155) BP: (P) 124/53 (07/09 1205) Pulse Rate: (P) 73 (07/09 1205)  Labs:  Recent Labs  12/04/16 2330 12/04/16 2340 12/05/16 1106 12/06/16 0135 12/07/16 0027 12/07/16 0319  HGB 9.9*  --   --  9.8*  --  9.8*  HCT 31.0*  --   --  31.3*  --  31.4*  PLT 221  --   --  202  --  197  LABPROT  --   --  22.8* 23.0*  --  26.0*  INR  --   --  1.97 2.00  --  2.33  CREATININE  --  5.22*  --   --  4.95*  --     Estimated Creatinine Clearance: 8 mL/min (A) (by C-G formula based on SCr of 4.95 mg/dL (H)).  Assessment:  Warfarin PTA for afib was on hold for lumbar spine aspiration. Following procedure, patient re-started on warfarin (on 7/3) and was placed on concomitant LMWH pending discharge; however, discharge delayed due to daptomycin acquistion by outpatient hemodialysis center.     INR up to 2.00 on 7/8 and 2.33 today. Therapeutic x 2.  Will stop Lovenox.    Home Coumadin regimen:  2.5 mg daily  Goal of Therapy:  INR  2-3 Anti-Xa level 0.6-1 units/ml 4hrs after LMWH dose given Monitor platelets by anticoagulation protocol: Yes   Plan:   Discontinue Lovenox.  Continue Coumadin 2.5 mg daily.  Daily PT/INR for now  Marya Landrygan, Hettie Roselli Donovan, RPh Pager: [phone] 12/07/2016,1:11 PM

## 2016-12-07 NOTE — Progress Notes (Signed)
CSW following for facilitating discharge. CSW received update from RN that patient has been tolerating sitting for longer periods of time over the weekend. Patient could possibly be medically ready to return to [facility] tomorrow, pending her outpatient dialysis facility receiving her antibiotic order.  CSW contacted patient's daughter to provide update on readiness to discharge. Patient's daughter appreciated the update.   CSW will continue to follow to facilitate discharge when medically ready.  Blenda NicelyElizabeth Anuar Walgren, [place] Clinical Social Worker [phone]

## 2016-12-08 LAB — CBC
HEMATOCRIT: 31.7 % — AB (ref 36.0–46.0)
Hemoglobin: 9.9 g/dL — ABNORMAL LOW (ref 12.0–15.0)
MCH: 29.4 pg (ref 26.0–34.0)
MCHC: 31.2 g/dL (ref 30.0–36.0)
MCV: 94.1 fL (ref 78.0–100.0)
PLATELETS: 186 10*3/uL (ref 150–400)
RBC: 3.37 MIL/uL — ABNORMAL LOW (ref 3.87–5.11)
RDW: 17.3 % — AB (ref 11.5–15.5)
WBC: 7.5 10*3/uL (ref 4.0–10.5)

## 2016-12-08 LAB — GLUCOSE, CAPILLARY
GLUCOSE-CAPILLARY: 77 mg/dL (ref 65–99)
GLUCOSE-CAPILLARY: 126 mg/dL — AB (ref 65–99)
GLUCOSE-CAPILLARY: 244 mg/dL — AB (ref 65–99)
Glucose-Capillary: 61 mg/dL — ABNORMAL LOW (ref 65–99)
Glucose-Capillary: 192 mg/dL — ABNORMAL HIGH (ref 65–99)

## 2016-12-08 LAB — PROTIME-INR
INR: 2.06
PROTHROMBIN TIME: 23.5 s — AB (ref 11.4–15.2)

## 2016-12-08 LAB — CK: Total CK: 46 U/L (ref 38–234)

## 2016-12-08 MED ORDER — GABAPENTIN 100 MG PO CAPS: 100.0000 mg | ORAL_CAPSULE | ORAL | Status: DC

## 2016-12-08 MED ORDER — SODIUM CHLORIDE 0.9 % IV SOLN: 500.0000 mg | INTRAVENOUS | 0 refills | Status: DC

## 2016-12-08 MED ORDER — INSULIN DETEMIR 100 UNIT/ML ~~LOC~~ SOLN
8.0000 [IU] | Freq: Every day | SUBCUTANEOUS | Status: DC
  Administered 2016-12-08 – 2016-12-09 (×2): 8 [IU] via SUBCUTANEOUS
  Filled 2016-12-08 (×2): qty 0.08

## 2016-12-08 MED ORDER — ENSURE ENLIVE PO LIQD
237.0000 mL | Freq: Three times a day (TID) | ORAL | Status: DC
  Administered 2016-12-08 – 2016-12-10 (×5): 237 mL via ORAL
  Filled 2016-12-08 (×9): qty 237

## 2016-12-08 MED ORDER — OXYCODONE HCL 5 MG PO TABS: 5.0000 mg | ORAL_TABLET | Freq: Four times a day (QID) | ORAL | Status: DC | PRN

## 2016-12-08 NOTE — Progress Notes (Signed)
PROGRESS NOTE    [name]   ZOX:[id number]  DOB: 02/08/1934  DOA: 11/28/2016 PCP: [name], MD   Brief Narrative:  [name] is a 83 y.o. female with medical history significant of ESRD dialysis on MWF, A.Fib on coumadin, HTN.  Patient is being treated for diskitis / osteomyelitis of L2-L3 with Vancomycin with dialysis. Aspirate in March demonstrated GPC in pairs on GS but cultures without growth. Despite treatment, her back pain has worsened and she presented to the ED at [facility].    CT reveals worsening L2-L3 discitis/ osteomyelitis with severe canal stenosis.  She was seen by ID on 6/29. They recommended to hold all antibiotics for a repeat aspiration of disc space and culture as soon as possible. Post aspiration started on daptomycin. Unable to discharge due to uncontrolled pain and inability to sit for 3 hours for HD.   Subjective: More clear today , still has intermittent confusion  Assessment & Plan:  HOSPITAL COURSE:   Discitis and Osteomyelitis of lumbar spine  worsening L2-L3 discitis/ osteomyelitis with severe canal stenosis on CT on 6/29 consulted  ID - previously on Vanc,  started daptomycin 7/3 , follow-up cultures of lumbar aspirate, Gram stain continue to be negative, continue daptomycin with hemodialysis for 6 weeks as per Dr. Orvan Falconer Need to make sure that HD unit has daptomycin prior to DC  Status post L2-L3 disc aspiration on 12/01/16 Patient encouraged to wear   TLSO brace Continue current pain control-pain is improving, mentally much more clear after stopping OxyContin,reduce frequency of oxycodone to q 6  ID Dr Algis Liming  ordered ankle-brachial index study on 11/29/16, which was abnormal,Right TBI of 0.14 and left TBI of 0.36 is suggestive of abnormal arterial flow at rest, Unable to obtain bilateral ABI's due to non-compressible arteries likely due to  calcification Patient will need follow-up with with Dr. Sampson Goon in [facility]   Chronic  atrial fibrillation  -CHA2DS2-VASc Score at least 6 -Coumadin held for aspiration of disc space- placed initially oniv Heparin until INR became therapeutic cont Cardizem -rate controlled INR now therapeutic     Anticoagulation management Continue Coumadin per pharmacy, INR therapeutic    ESRD on dialysis , currently Tuesday Thursday Saturday Nephrology converted to Monday Wednesday Friday schedule Next HD 7/11  Early cirrhosis - noted on CT 6/29  Right middle lobe pulm nodules - stable from last imaging 13 mo ago-f/u Ct in 1 yr  dCHF- chronic Patient was onlasix 80 BID as outpt - does not make urine and therefore discontinued - mod conc hypertrophy on last ECHO 1/17  Diabetes mellitus 2 Reduce Levemir , hypoglycemic at 6:30 this morning  Hypokalemia - resolved  Hyponatremia - correct with dialysis / per nephro  Right sciatic nerve stimulator    DVT prophylaxis: SCDs- INR still therapeutic Code Status: DNR Family Communication: By the bedside Disposition Plan:  DC to SNF when  daptomycin available at HD center in [place]   Consultants:   ID  IR  Nephrology Procedures:    S/P L2 -L3 fluoro guided disc aspiration 7/3   Antimicrobials:  Anti-infectives    Start     Dose/Rate Route Frequency Ordered Stop   12/05/16 0000  DAPTOmycin 500 mg in sodium chloride 0.9 % 100 mL     500 mg 220 mL/hr over 30 Minutes Intravenous Every 48 hours 12/04/16 0822 01/14/17 2359   12/04/16 0000  DAPTOmycin 500 mg in sodium chloride 0.9 % 100 mL  Status:  Discontinued  Comments:  Daptomycin to be administered after hemodialysis   500 mg 220 mL/hr over 30 Minutes Intravenous Every M-W-F (Hemodialysis) 12/03/16 0830 12/04/16    12/03/16 1500  DAPTOmycin (CUBICIN) 500 mg in sodium chloride 0.9 % IVPB  Status:  Discontinued     500 mg 220 mL/hr over 30 Minutes Intravenous Every 48 hours 12/01/16 1559 12/01/16 1614   12/03/16 1400  DAPTOmycin (CUBICIN)  500 mg in sodium chloride 0.9 % IVPB     500 mg 220 mL/hr over 30 Minutes Intravenous Every 48 hours 12/03/16 1222     12/01/16 1700  DAPTOmycin (CUBICIN) 500 mg in sodium chloride 0.9 % IVPB  Status:  Discontinued     500 mg 220 mL/hr over 30 Minutes Intravenous Every 48 hours 12/01/16 1614 12/03/16 1222   12/01/16 1500  DAPTOmycin (CUBICIN) 523 mg in sodium chloride 0.9 % IVPB  Status:  Discontinued     8 mg/kg  65.4 kg 220.9 mL/hr over 30 Minutes Intravenous Every 48 hours 12/01/16 1217 12/01/16 1559       Objective: Vitals:   12/07/16 1704 12/07/16 2100 12/08/16 0000 12/08/16 0412  BP: (!) 155/80 124/65 (!) 122/56 (!) 116/54  Pulse: (!) 126 (!) 109 96 99  Resp: 20 18 20 18   Temp: 98.4 F (36.9 C) 99.1 F (37.3 C) 98.8 F (37.1 C) 98.4 F (36.9 C)  TempSrc: Oral Oral Oral Oral  SpO2: 100% 100% 100% 99%  Weight:      Height:        Intake/Output Summary (Last 24 hours) at 12/08/16 0949 Last data filed at 12/07/16 1535  Gross per 24 hour  Intake              240 ml  Output             2000 ml  Net            -1760 ml   Filed Weights   12/04/16 1758 12/04/16 2323 12/05/16 0305  Weight: 68.1 kg (150 lb 2.1 oz) 67.6 kg (149 lb 0.5 oz) 65.6 kg (144 lb 10 oz)    Examination: General exam:Continues to have a lot of pain with minimal movement HEENT: PERRLA, oral mucosa moist, no sclera icterus or thrush Respiratory system: Clear to auscultation. Respiratory effort normal. Cardiovascular system: S1 & S2 heard, RRR.  No murmurs  Gastrointestinal system: Abdomen soft, non-tender, nondistended. Normal bowel sound. No organomegaly Central nervous system: Alert and oriented. No focal neurological deficits. Extremities: No cyanosis, clubbing or edema MSK- tenderness in lower back noted.  Skin: No rashes or ulcers Psychiatry:  Mood & affect appropriate.     Data Reviewed: I have personally reviewed following labs and imaging studies  CBC:  Recent Labs Lab  12/04/16 0545 12/04/16 2330 12/06/16 0135 12/07/16 0319 12/08/16 0609  WBC 7.8 8.1 7.9 8.4 7.5  HGB 10.3* 9.9* 9.8* 9.8* 9.9*  HCT 32.9* 31.0* 31.3* 31.4* 31.7*  MCV 94.5 93.7 94.8 95.4 94.1  PLT 212 221 202 197 186   Basic Metabolic Panel:  Recent Labs Lab 12/02/16 0229 12/02/16 2003 12/03/16 0518 12/04/16 2340 12/07/16 0027  NA 132* 134* 131* 130* 133*  K 4.0 4.3 3.8 4.9 4.8  CL 96* 96* 95* 93* 96*  CO2 25 26 26 26 27   GLUCOSE 150* 183* 174* 158* 159*  BUN 27* 39* 19 47* 40*  CREATININE 4.81* 5.40* 3.24* 5.22* 4.95*  CALCIUM 8.3* 8.4* 8.2* 8.6* 8.6*  MG  --   --   --   --  2.2  PHOS  --  2.7  --  2.9  --    GFR: Estimated Creatinine Clearance: 8 mL/min (A) (by C-G formula based on SCr of 4.95 mg/dL (H)). Liver Function Tests:  Recent Labs Lab 12/02/16 2003 12/03/16 0518 12/04/16 2340  AST  --  20  --   ALT  --  12*  --   ALKPHOS  --  73  --   BILITOT  --  0.6  --   PROT  --  6.4*  --   ALBUMIN 2.5* 2.8* 2.6*   No results for input(s): LIPASE, AMYLASE in the last 168 hours. No results for input(s): AMMONIA in the last 168 hours. Coagulation Profile:  Recent Labs Lab 12/04/16 0545 12/05/16 1106 12/06/16 0135 12/07/16 0319 12/08/16 0609  INR 1.69 1.97 2.00 2.33 2.06   Cardiac Enzymes:  Recent Labs Lab 12/01/16 1416 12/04/16 0911 12/08/16 0609  CKTOTAL  --  42 46  CKMB 0  --   --    BNP (last 3 results) No results for input(s): PROBNP in the last 8760 hours. HbA1C: No results for input(s): HGBA1C in the last 72 hours. CBG:  Recent Labs Lab 12/07/16 1135 12/07/16 1648 12/07/16 2110 12/08/16 0632 12/08/16 0657  GLUCAP 95 149* 207* 61* 77   Lipid Profile: No results for input(s): CHOL, HDL, LDLCALC, TRIG, CHOLHDL, LDLDIRECT in the last 72 hours. Thyroid Function Tests: No results for input(s): TSH, T4TOTAL, FREET4, T3FREE, THYROIDAB in the last 72 hours. Anemia Panel: No results for input(s): VITAMINB12, FOLATE, FERRITIN, TIBC,  IRON, RETICCTPCT in the last 72 hours. Urine analysis:    Component Value Date/Time   COLORURINE AMBER (A) 08/09/2016 2204   APPEARANCEUR TURBID (A) 08/09/2016 2204   LABSPEC 1.014 08/09/2016 2204   PHURINE 6.0 08/09/2016 2204   GLUCOSEU NEGATIVE 08/09/2016 2204   HGBUR MODERATE (A) 08/09/2016 2204   BILIRUBINUR NEGATIVE 08/09/2016 2204   KETONESUR NEGATIVE 08/09/2016 2204   PROTEINUR 100 (A) 08/09/2016 2204   NITRITE NEGATIVE 08/09/2016 2204   LEUKOCYTESUR LARGE (A) 08/09/2016 2204   Sepsis Labs: @LABRCNTIP (procalcitonin:4,lacticidven:4) ) Recent Results (from the past 240 hour(s))  Body fluid culture     Status: None   Collection Time: 12/01/16  8:50 AM  Result Value Ref Range Status   Specimen Description FLUID  Final   Special Requests L2 3 DISC ASPIRATE SENT 2 SYRINGES  Final   Gram Stain   Final    RARE WBC PRESENT, PREDOMINANTLY MONONUCLEAR NO ORGANISMS SEEN    Culture NO GROWTH 3 DAYS  Final   Report Status 12/05/2016 FINAL  Final         Radiology Studies: No results found.    Scheduled Meds: . acetaminophen  650 mg Oral QID  . calcium acetate  1,334 mg Oral TID WC  . collagenase   Topical Daily  . darbepoetin (ARANESP) injection - DIALYSIS  60 mcg Intravenous Q Mon-HD  . diltiazem  120 mg Oral QHS  . famotidine  20 mg Oral QHS  . feeding supplement (ENSURE ENLIVE)  237 mL Oral BID BM  . gabapentin  100 mg Oral Once per day on Sun Tue Thu Sat  . gabapentin  300 mg Oral Once per day on Mon Wed Fri  . insulin aspart  0-9 Units Subcutaneous TID WC  . insulin detemir  10 Units Subcutaneous QHS  . lidocaine  1 patch Transdermal Q24H  . multivitamin  1 tablet Oral Daily  . omega-3 acid ethyl esters  1,000 mg Oral Daily  . PARoxetine  40 mg Oral QHS  . pravastatin  20 mg Oral QPM  . senna-docusate  2 tablet Oral BID  . warfarin  2.5 mg Oral q1800  . Warfarin - Pharmacist Dosing Inpatient   Does not apply q1800   Continuous Infusions: . DAPTOmycin  (CUBICIN)  IV Stopped (12/07/16 1956)     LOS: 10 days    Time spent in minutes: 35    Richarda OverlieNayana Munachimso Palin, MD [facility] Pager: [email or web address] Password [facility] 12/08/2016, 9:49 AM

## 2016-12-08 NOTE — Progress Notes (Signed)
CSW following for discharge planning. [facility]'t [facility] patient's antibiotic at this time to discharge back to [facility]. CSW updated patient's daughter on status.  CSW will continue to follow to facilitate discharge back to [facility] when patient is able to receive medications in dialysis.  Blenda NicelyElizabeth Shantina Chronister, [place] Clinical Social Worker [phone]

## 2016-12-08 NOTE — Progress Notes (Signed)
S:Says she is eating.  No SOB O:BP (!) 116/54 (BP Location: Left Arm)   Pulse 99   Temp 98.4 F (36.9 C) (Oral)   Resp 18   Ht 5\' 5"  (1.651 m)   Wt 65.6 kg (144 lb 10 oz)   SpO2 99%   BMI 24.07 kg/m   Intake/Output Summary (Last 24 hours) at 12/08/16 0916 Last data filed at 12/07/16 1535  Gross per 24 hour  Intake              240 ml  Output             2000 ml  Net            -1760 ml   Weight change:  ZOX:WRUEAGen:Awake and alert VWU:JWJXBCVS:irreg, irreg Resp:clear Abd:+ BS Mild superficial tenderness, ND Ext: No edema.  RUA AVF + bruit NEURO:CNI Ox2, no asterixis   . acetaminophen  650 mg Oral QID  . calcium acetate  1,334 mg Oral TID WC  . collagenase   Topical Daily  . darbepoetin (ARANESP) injection - DIALYSIS  60 mcg Intravenous Q Mon-HD  . diltiazem  120 mg Oral QHS  . famotidine  20 mg Oral QHS  . feeding supplement (ENSURE ENLIVE)  237 mL Oral BID BM  . gabapentin  100 mg Oral Once per day on Sun Tue Thu Sat  . gabapentin  300 mg Oral Once per day on Mon Wed Fri  . insulin aspart  0-9 Units Subcutaneous TID WC  . insulin detemir  10 Units Subcutaneous QHS  . lidocaine  1 patch Transdermal Q24H  . multivitamin  1 tablet Oral Daily  . omega-3 acid ethyl esters  1,000 mg Oral Daily  . PARoxetine  40 mg Oral QHS  . pravastatin  20 mg Oral QPM  . senna-docusate  2 tablet Oral BID  . warfarin  2.5 mg Oral q1800  . Warfarin - Pharmacist Dosing Inpatient   Does not apply q1800   No results found. BMET    Component Value Date/Time   NA 133 (L) 12/07/2016 0027   NA 137 07/04/2014 1303   K 4.8 12/07/2016 0027   K 3.3 (L) 07/04/2014 1303   CL 96 (L) 12/07/2016 0027   CL 100 07/04/2014 1303   CO2 27 12/07/2016 0027   CO2 29 07/04/2014 1303   GLUCOSE 159 (H) 12/07/2016 0027   GLUCOSE 76 07/04/2014 1303   BUN 40 (H) 12/07/2016 0027   BUN 44 (H) 07/04/2014 1303   CREATININE 4.95 (H) 12/07/2016 0027   CREATININE 2.72 (H) 07/04/2014 1303   CALCIUM 8.6 (L) 12/07/2016 0027   CALCIUM 8.9 07/04/2014 1303   GFRNONAA 7 (L) 12/07/2016 0027   GFRNONAA 18 (L) 07/04/2014 1303   GFRNONAA 29 (L) 12/02/2013 0847   GFRAA 9 (L) 12/07/2016 0027   GFRAA 22 (L) 07/04/2014 1303   GFRAA 34 (L) 12/02/2013 0847   CBC    Component Value Date/Time   WBC 7.5 12/08/2016 0609   RBC 3.37 (L) 12/08/2016 0609   HGB 9.9 (L) 12/08/2016 0609   HGB 14.2 12/03/2013 0820   HCT 31.7 (L) 12/08/2016 0609   HCT 42.7 12/03/2013 0820   PLT 186 12/08/2016 0609   PLT 214 12/03/2013 0820   MCV 94.1 12/08/2016 0609   MCV 94 12/03/2013 0820   MCH 29.4 12/08/2016 0609   MCHC 31.2 12/08/2016 0609   RDW 17.3 (H) 12/08/2016 0609   RDW 17.1 (H) 12/03/2013 0820   LYMPHSABS  0.8 (L) 08/09/2016 2011   LYMPHSABS 1.1 12/03/2013 0820   MONOABS 0.7 08/09/2016 2011   MONOABS 0.9 12/03/2013 0820   EOSABS 0.2 08/09/2016 2011   EOSABS 0.2 12/03/2013 0820   BASOSABS 0.1 08/09/2016 2011   BASOSABS 0.1 12/03/2013 0820     Assessment:  1. ESRD On HD [facility] MWF 2. Osteomyelitis/disckitis L2-3 on Daptomycin 3. HTN 4. Chronic A fib on coumadin 5. DM 6. Anemia on Aranesp    Plan: 1. HD tomorrow 2. Awaiting for her HD to get Dapto and for pt to be able to sit in chair for 3hr to get HD 3. PO4 2.9 last check, ? If needs 6 phoslo q d.  Recheck PO4 with HD tomorrow   Shequila Neglia T

## 2016-12-08 NOTE — Progress Notes (Signed)
ANTICOAGULATION CONSULT NOTE - Follow Up Consult  Pharmacy Consult for Coumadin Indication: atrial fibrillation  Allergies  Allergen Reactions  . Ceftazidime Other (See Comments)    myoclonus  . Codeine Itching and Other (See Comments)    Reaction:  Confusion/Hallucinations   . Contrast Media [Iodinated Diagnostic Agents] Other (See Comments)    "skin peel" breaks into a blistered rash, then peel. If given Benadryl and solumedrol she will be okay  . Oxycodone Other (See Comments)    Reaction:  Confusion/Hallucinations   . Quinine Derivatives Rash  . Tape Other (See Comments)    Skin is very thin and will blister and pull off skin with any tape except paper tape.  PLEASE USE PAPER TAPE.    Patient Measurements: Height: 5\' 5"  (165.1 cm) Weight:  (uto- pt in recliner and too unstable to stand on scale.) IBW/kg (Calculated) : 57  Last Weight: 65.6 kg (7/7)  Vital Signs: Temp: 98.4 F (36.9 C) (07/10 0412) Temp Source: Oral (07/10 0412) BP: 116/54 (07/10 0412) Pulse Rate: 99 (07/10 0412)  Labs:  Recent Labs  12/06/16 0135 12/07/16 0027 12/07/16 0319 12/08/16 0609  HGB 9.8*  --  9.8* 9.9*  HCT 31.3*  --  31.4* 31.7*  PLT 202  --  197 186  LABPROT 23.0*  --  26.0* 23.5*  INR 2.00  --  2.33 2.06  CREATININE  --  4.95*  --   --   CKTOTAL  --   --   --  46    Estimated Creatinine Clearance: 8 mL/min (A) (by C-G formula based on SCr of 4.95 mg/dL (H)).  Assessment: Warfarin PTA for Afib was on hold for lumbar spine apiration. Following procedure, patient re-started warfarin (7/3) with concomitant LMWH. INR therapeutic x2, LMWH discontinued 7/9. No signs/symptoms of bleeding. Discharge delayed due to daptomycin acquisition by outpatient HD center.   INR 2.33 >> 2.06 today. Therapeutic.  Home coumadin regimen: 2.5 mg PO daily  Goal of Therapy:  INR 2-3 Monitor platelets by anticoagulation protocol: Yes   Plan:  Continue PTA coumadin dose: 2.5 mg PO daily Daily  INR Monitor CBC  Hershal Coriaiana L Solly Derasmo, PharmD, MS PGY1 Pharmacy Resident 12/08/2016,10:22 AM

## 2016-12-08 NOTE — Progress Notes (Signed)
Pharmacy Antibiotic Note  [name] is a 83 y.o. female admitted on 11/28/2016 with history of osteomyelitis of right heel/diskitis s/p IR guided aspiration. Patient is ESRD on HD MWF, last session 7/9. Patient failed 6 weeks vancomycin. Pharmacy has been consulted for daptomycin dosing, plan is to continue daptomycin x 6 weeks. Discharge is pending HD center acquisition of daptomycin. Patient is afebrile, CK stable (7/10: 46) and monitored weekly.  Plan: Daptomycin 500 mg IV q48h, ~8 mg/kg (last dose 7/9): end date 01/13/2017. Weekly CK  Monitor HD plans  Height: 5\' 5"  (165.1 cm) Weight:  (uto- pt in recliner and too unstable to stand on scale.) IBW/kg (Calculated) : 57  Last weight: 65.6 kg (7/7)  Temp (24hrs), Avg:98.2 F (36.8 C), Min:97.1 F (36.2 C), Max:99.1 F (37.3 C)   Recent Labs Lab 12/02/16 0229 12/02/16 2003 12/03/16 0518 12/04/16 0545 12/04/16 2330 12/04/16 2340 12/06/16 0135 12/07/16 0027 12/07/16 0319 12/08/16 0609  WBC 7.9 7.2 7.9 7.8 8.1  --  7.9  --  8.4 7.5  CREATININE 4.81* 5.40* 3.24*  --   --  5.22*  --  4.95*  --   --     Estimated Creatinine Clearance: 8 mL/min (A) (by C-G formula based on SCr of 4.95 mg/dL (H)).    Allergies  Allergen Reactions  . Ceftazidime Other (See Comments)    myoclonus  . Codeine Itching and Other (See Comments)    Reaction:  Confusion/Hallucinations   . Contrast Media [Iodinated Diagnostic Agents] Other (See Comments)    "skin peel" breaks into a blistered rash, then peel. If given Benadryl and solumedrol she will be okay  . Oxycodone Other (See Comments)    Reaction:  Confusion/Hallucinations   . Quinine Derivatives Rash  . Tape Other (See Comments)    Skin is very thin and will blister and pull off skin with any tape except paper tape.  PLEASE USE PAPER TAPE.    Antimicrobials this admission: Dapto 7/3 >> (8/15)  Microbiology results: 7/3: body fluid culture: Negative  Thank you for allowing pharmacy  to be a part of this patient's care.  Hershal Coriaiana L Prerna Harold, PharmD, MS, BS 12/08/2016 10:37 AM

## 2016-12-08 NOTE — Progress Notes (Signed)
CM spoke to BeverlyShannon at [facility] in [place] about the Daptomycin. They have not ordered the medication for outpatient dialysis as of yet. Carollee HerterShannon requested an order for the medication be faxed to [facility] [phone]). CM informed Dr Susie CassetteAbrol. She did a script and it was faxed to [facility] at the number above. Carollee HerterShannon could not provide a date as to when they would have the medication available. CSW updated. CM following.

## 2016-12-08 NOTE — Progress Notes (Signed)
CM received phone call from [name] at [facility] and they also need consult information from ID. Information faxed per request.

## 2016-12-08 NOTE — Progress Notes (Signed)
Nutrition Follow-up  DOCUMENTATION CODES:   Non-severe (moderate) malnutrition in context of chronic illness  INTERVENTION:   -Continue Ensure Enlive po but increase frequency to TID, each supplement provides 350 kcal and 20 grams of protein  -Smaller, more frequent meals. Snacks ordered between meals  -Continue assistance at meal times   NUTRITION DIAGNOSIS:   Malnutrition (Moderate) related to chronic illness (ESRD on HD) as evidenced by moderate depletion of body fat, severe depletion of muscle mass.  Continues but being addressed via supplements, snacks  GOAL:   Patient will meet greater than or equal to 90% of their needs  Progressing  MONITOR:   PO intake, Supplement acceptance  REASON FOR ASSESSMENT:   Consult Wound healing  ASSESSMENT:   83 yo female with PMH of ESRD-on HD, DM, HTN, pleural effusion, PVD, GERD, anxiety, neuropathy, stroke who was admitted on 6/30 with back pain (recent diskitis / osteomyelitis). CT revealed worsening L2-L3 discitis / osteomyelitis.  Recorded po intake 64% of meals. Pt reports very poor appetite, attributes this mostly to her severe back pain and inability to sit up for any period of time. Pt does report drinking her Ensure Enlive BID.   Noted pt with periods of hypoglycemia, pt may benefit from smaller, more frequent meals due to early satiety and until appetite improves  Phosphorus <3.5 for HD, on Phoslo. Noted possibility of reducing binder therapy per MD note. Agree with liberalized diet WITHOUT phosphorus restriction at present  Last HD 12/07/16, EDW 67 kg. Per pt and her family, pt weighed over 300 pounds when she started dialysis several years ago. Current wt 144 pounds. Weight relatively stable recently however.   Nutrition-Focused physical exam completed. Findings are mild/moderate fat depletion, mild/moderate to severe muscle depletion, and mild edema.   Labs: CBGs 61-207, sodium 133, phosphorus 2.9 (plan to recheck w/  HD tomorrow) Meds: phoslo 2 with meals, ss novolog, levemir, rena-vit  Diet Order:  Diet Carb Modified Fluid consistency: Thin; Room service appropriate? Yes  Skin:  Wound (see comment) (stage II heel and coccyx, diabetic foot ulcers)  Last BM:  7/10  Height:   Ht Readings from Last 1 Encounters:  11/28/16 5\' 5"  (1.651 m)    Weight:   Wt Readings from Last 1 Encounters:  11/27/16 153 lb 6.4 oz (69.6 kg)    Ideal Body Weight:  56.8 kg  BMI:  Body mass index is 24.07 kg/m.  Estimated Nutritional Needs:   Kcal:  1800-2000  Protein:  80-95 gm  Fluid:  1.2 L  EDUCATION NEEDS:   No education needs identified at this time  Romelle StarcherCate Arty Lantzy MS, RD, LDN [phone] Pager  [phone] Weekend/On-Call Pager

## 2016-12-08 NOTE — Progress Notes (Signed)
Physical Therapy Treatment Patient Details Name: [name] MRN: [id number] DOB: 23-Oct-1933 Today's Date: 12/08/2016    History of Present Illness Patient is an 83 y.o. female with medical history significant of ESRD dialysis on MWF, A.Fib on coumadin, HTN.  Patient has recently been battling diskitis / osteomyelitis of L2-L3.  Aspirate in March demonstrated GPC in pairs on GS but cultures without growth.  She was put on vancomycin with dialysis.  Despite this her back pain has worsened and she presented to the ED at [facility] once again today.  Work up there includes CT abd/pelvis which demonstrates worsening of her osteomyelitis. She is s/p disc aspiration on 12/01/16 at L2/3 level.     PT Comments    Patient seen for activity progression. Received up in chair 3 hours. Patient requesting to return to bed but agreeable to attempt in room activity. Patient assisted with in room mobility and functional task performance and hygiene/pericare during session.   OF NOTE: Patient able to initiate BM but limited by extreme pain and impaction. Patient tearful and states that this happens and makes the pain worse when she is constipated. Nsg aware  Patient assisted back to bed post activity. Will continue to see and progress as tolerated.   Follow Up Recommendations  SNF     Equipment Recommendations  None recommended by PT    Recommendations for Other Services       Precautions / Restrictions Precautions Precautions: Fall;Back Precaution Comments: Pt tends to "scoot" out of bed/chair. Family educated on this. Required Braces or Orthoses: Spinal Brace Spinal Brace: Thoracolumbosacral orthotic;Other (comment);Applied in sitting position Spinal Brace Comments: Patient declined TLSO due to pain.  Did agree to wear smaller support brace. Restrictions Weight Bearing Restrictions: No    Mobility  Bed Mobility Overal bed mobility: Needs Assistance Bed Mobility: Rolling;Sit to Sidelying Rolling:  Min assist       Sit to sidelying: Min assist General bed mobility comments: min assist to elevate LEs back to bed and reposition, min assist for rolling in bed due to pain  Transfers Overall transfer level: Needs assistance Equipment used: Rolling walker (2 wheeled) Transfers: Sit to/from Stand Sit to Stand: Min assist;+2 safety/equipment         General transfer comment: Initially min assist to power up to standing, bilateral UE support +@ and use of RW, performed x5 during session from [facility] requiring increased assist as patient became limited by fatigue.   Ambulation/Gait Ambulation/Gait assistance: Mod assist;+2 safety/equipment Ambulation Distance (Feet): 10 Feet Assistive device: Rolling walker (2 wheeled) Gait Pattern/deviations: Step-through pattern;Decreased step length - right;Decreased step length - left;Decreased stride length;Shuffle;Leaning posteriorly Gait velocity: decreased Gait velocity interpretation: <1.8 ft/sec, indicative of risk for recurrent falls General Gait Details: Mac cues for safety and upright posture, moderate assist for stability.   Stairs            Wheelchair Mobility    Modified Rankin (Stroke Patients Only)       Balance Overall balance assessment: Needs assistance Sitting-balance support: Feet supported;Bilateral upper extremity supported Sitting balance-Leahy Scale: Fair Sitting balance - Comments: Pt prefers to lean off of her left hip in sitting if she has to be EOB more than a few mins.   Postural control: Right lateral lean;Posterior lean Standing balance support: Bilateral upper extremity supported Standing balance-Leahy Scale: Poor Standing balance comment: Min to moderate assist in standing as patient began to fatigue. Heavy relaince on RW for BUE support  Cognition Arousal/Alertness: Awake/alert Behavior During Therapy: Anxious Overall Cognitive Status: No family/caregiver  present to determine baseline cognitive functioning                                 General Comments: patient extremely anxious with mobility       Exercises Other Exercises Other Exercises: attempted LE activities while on BSC, patient intermittently performing but limited by pain    General Comments General comments (skin integrity, edema, etc.): assisted patient with functional task performance and hygiene/pericare during session. Patient able to initiate BM but limited by extreme pain and impaction. Patient tearful and states that this happens and makes the pain worse when she is constipated.      Pertinent Vitals/Pain Pain Assessment: 0-10 Faces Pain Scale: Hurts whole lot Pain Location: back and left hip and buttocks Pain Descriptors / Indicators: Grimacing;Guarding Pain Intervention(s): Monitored during session    Home Living                      Prior Function            PT Goals (current goals can now be found in the care plan section) Acute Rehab PT Goals Patient Stated Goal: decrease left hip/leg pain PT Goal Formulation: With patient Time For Goal Achievement: 12/16/16 Potential to Achieve Goals: Fair Progress towards PT goals: Progressing toward goals    Frequency    Min 3X/week      PT Plan Current plan remains appropriate    Co-evaluation              AM-PAC PT "6 Clicks" Daily Activity  Outcome Measure  Difficulty turning over in bed (including adjusting bedclothes, sheets and blankets)?: Total Difficulty moving from lying on back to sitting on the side of the bed? : Total Difficulty sitting down on and standing up from a chair with arms (e.g., wheelchair, bedside commode, etc,.)?: Total Help needed moving to and from a bed to chair (including a wheelchair)?: A Lot Help needed walking in hospital room?: A Lot Help needed climbing 3-5 steps with a railing? : Total 6 Click Score: 8    End of Session Equipment  Utilized During Treatment: Gait belt;Back brace Activity Tolerance: Patient limited by pain;Patient limited by fatigue Patient left: in bed;with call bell/phone within reach;with bed alarm set;with nursing/sitter in room Nurse Communication: Mobility status PT Visit Diagnosis: Muscle weakness (generalized) (M62.81);Unsteadiness on feet (R26.81);Other abnormalities of gait and mobility (R26.89);Pain Pain - Right/Left: Left Pain - part of body: Hip (back)     Time: 8889-1694 PT Time Calculation (min) (ACUTE ONLY): 21 min  Charges:  $Therapeutic Activity: 8-22 mins                    G Codes:       Alben Deeds, PT DPT NCS [phone]    Duncan Dull 12/08/2016, 2:40 PM

## 2016-12-08 NOTE — Progress Notes (Signed)
Hypoglycemic Event  CBG: 61 at 0632  Treatment: 15 GM carbohydrate snack  Symptoms: Pale and None  Follow-up CBG: Time:0657 CBG Result:77  Possible Reasons for Event: Inadequate meal intake  Comments/MD notified: Dr Otelia SanteeAbrol    Obasogie-Asidi, Angele Wiemann Efe

## 2016-12-09 LAB — RENAL FUNCTION PANEL
Albumin: 2.7 g/dL — ABNORMAL LOW (ref 3.5–5.0)
Anion gap: 12 (ref 5–15)
BUN: 47 mg/dL — AB (ref 6–20)
CHLORIDE: 94 mmol/L — AB (ref 101–111)
CO2: 26 mmol/L (ref 22–32)
CREATININE: 4.84 mg/dL — AB (ref 0.44–1.00)
Calcium: 9.5 mg/dL (ref 8.9–10.3)
GFR calc non Af Amer: 8 mL/min — ABNORMAL LOW (ref >60)
GFR, EST AFRICAN AMERICAN: 9 mL/min — AB (ref >60)
Glucose, Bld: 150 mg/dL — ABNORMAL HIGH (ref 65–99)
POTASSIUM: 4.9 mmol/L (ref 3.5–5.1)
Phosphorus: 2.7 mg/dL (ref 2.5–4.6)
Sodium: 132 mmol/L — ABNORMAL LOW (ref 135–145)

## 2016-12-09 LAB — CBC
HEMATOCRIT: 30.6 % — AB (ref 36.0–46.0)
Hemoglobin: 9.7 g/dL — ABNORMAL LOW (ref 12.0–15.0)
MCH: 29.4 pg (ref 26.0–34.0)
MCHC: 31.7 g/dL (ref 30.0–36.0)
MCV: 92.7 fL (ref 78.0–100.0)
PLATELETS: 226 10*3/uL (ref 150–400)
RBC: 3.3 MIL/uL — ABNORMAL LOW (ref 3.87–5.11)
RDW: 17.5 % — AB (ref 11.5–15.5)
WBC: 9 10*3/uL (ref 4.0–10.5)

## 2016-12-09 LAB — GLUCOSE, CAPILLARY
GLUCOSE-CAPILLARY: 131 mg/dL — AB (ref 65–99)
GLUCOSE-CAPILLARY: 127 mg/dL — AB (ref 65–99)
Glucose-Capillary: 76 mg/dL (ref 65–99)

## 2016-12-09 LAB — COMPREHENSIVE METABOLIC PANEL
ALT: 12 U/L — ABNORMAL LOW (ref 14–54)
AST: 23 U/L (ref 15–41)
Albumin: 2.7 g/dL — ABNORMAL LOW (ref 3.5–5.0)
Alkaline Phosphatase: 63 U/L (ref 38–126)
Anion gap: 13 (ref 5–15)
BILIRUBIN TOTAL: 0.6 mg/dL (ref 0.3–1.2)
BUN: 40 mg/dL — AB (ref 6–20)
CHLORIDE: 96 mmol/L — AB (ref 101–111)
CO2: 25 mmol/L (ref 22–32)
Calcium: 9.3 mg/dL (ref 8.9–10.3)
Creatinine, Ser: 4.27 mg/dL — ABNORMAL HIGH (ref 0.44–1.00)
GFR calc Af Amer: 10 mL/min — ABNORMAL LOW (ref >60)
GFR calc non Af Amer: 9 mL/min — ABNORMAL LOW (ref >60)
GLUCOSE: 86 mg/dL (ref 65–99)
POTASSIUM: 4.5 mmol/L (ref 3.5–5.1)
SODIUM: 134 mmol/L — AB (ref 135–145)
Total Protein: 6.6 g/dL (ref 6.5–8.1)

## 2016-12-09 LAB — PROTIME-INR
INR: 2.77
Prothrombin Time: 29.8 s — ABNORMAL HIGH (ref 11.4–15.2)

## 2016-12-09 LAB — PHOSPHORUS: PHOSPHORUS: 2.3 mg/dL — AB (ref 2.5–4.6)

## 2016-12-09 MED ORDER — HEPARIN SODIUM (PORCINE) 1000 UNIT/ML DIALYSIS
20.0000 [IU]/kg | INTRAMUSCULAR | Status: DC | PRN
  Administered 2016-12-09: 1300 [IU] via INTRAVENOUS_CENTRAL
  Filled 2016-12-09 (×2): qty 2

## 2016-12-09 NOTE — Progress Notes (Signed)
PROGRESS NOTE    [name]   ZOX:[id number]  DOB: 1934-03-26  DOA: 11/28/2016 PCP: [name], MD   Brief Narrative:  [name] is a 83 y.o. female with medical history significant of ESRD dialysis on MWF, A.Fib on coumadin, HTN.  Patient is being treated for diskitis / osteomyelitis of L2-L3 with Vancomycin with dialysis. Aspirate in March demonstrated GPC in pairs on GS but cultures without growth. Despite treatment, her back pain has worsened and she presented to the ED at [facility].    CT reveals worsening L2-L3 discitis/ osteomyelitis with severe canal stenosis.  She was seen by ID on 6/29. They recommended to hold all antibiotics for a repeat aspiration of disc space and culture as soon as possible. Post aspiration started on daptomycin. Unable to discharge due to uncontrolled pain and inability to sit for 3 hours for HD.   Subjective: More clear today ,  But somewhat more pain in lower back due to decrease in pain medicine yesterday  Assessment & Plan:  HOSPITAL COURSE:   Discitis and Osteomyelitis of lumbar spine  worsening L2-L3 discitis/ osteomyelitis with severe canal stenosis on CT on 6/29 consulted  ID - previously on Vanc,  started daptomycin 7/3 , follow-up cultures of lumbar aspirate, Gram stain continue to be negative, continue daptomycin with hemodialysis for 6 weeks as per Dr. Orvan Falconer Need to make sure that HD unit has daptomycin prior to DC, may need   alternative hemodialysis unit to be able to complete daptomycin therapy   Status post L2-L3 disc aspiration on 12/01/16 Patient encouraged to wear   TLSO brace Continue current pain regimen, hold for sedation ID Dr Algis Liming  ordered ankle-brachial index study on 11/29/16, which was abnormal,Right TBI of 0.14 and left TBI of 0.36 is suggestive of abnormal arterial flow at rest, Unable to obtain bilateral ABI's due to non-compressible arteries likely due to  calcification Patient will need follow-up with [facility]   Chronic atrial fibrillation  -CHA2DS2-VASc Score at least 6 -Coumadin held for aspiration of disc space- placed initially oniv Heparin until INR became therapeutic cont Cardizem -rate controlled INR now therapeutic     Anticoagulation management Continue Coumadin per pharmacy, INR therapeutic    ESRD on dialysis , currently Tuesday Thursday Saturday Nephrology converted to Monday Wednesday Friday schedule Next HD 7/11  Early cirrhosis - noted on CT 6/29  Right middle lobe pulm nodules - stable from last imaging 13 mo ago-f/u Ct in 1 yr  dCHF- chronic Patient was onlasix 80 BID as outpt - does not make urine and therefore discontinued - mod conc hypertrophy on last ECHO 1/17  Diabetes mellitus 2 Reduce Levemir , hypoglycemic at 6:30 this morning  Hypokalemia - resolved  Hyponatremia - correct with dialysis / per nephro  Right sciatic nerve stimulator    DVT prophylaxis: SCDs- INR still therapeutic Code Status: DNR Family Communication:   Disposition Plan:  DC to SNF when  daptomycin available at HD center in [place], discussed with case manager   Consultants:   ID  IR  Nephrology Procedures:    S/P L2 -L3 fluoro guided disc aspiration 7/3   Antimicrobials:  Anti-infectives    Start     Dose/Rate Route Frequency Ordered Stop   12/08/16 0000  DAPTOmycin 500 mg in sodium chloride 0.9 % 100 mL     500 mg 220 mL/hr over 30 Minutes Intravenous Every 48 hours 12/08/16 1001 01/17/17 2359   12/05/16 0000  DAPTOmycin 500 mg in sodium chloride 0.9 % 100 mL  Status:  Discontinued     500 mg 220 mL/hr over 30 Minutes Intravenous Every 48 hours 12/04/16 0822 12/08/16    12/04/16 0000  DAPTOmycin 500 mg in sodium chloride 0.9 % 100 mL  Status:  Discontinued    Comments:  Daptomycin to be administered after hemodialysis   500 mg 220 mL/hr over 30 Minutes Intravenous Every M-W-F (Hemodialysis) 12/03/16 0830  12/04/16    12/03/16 1500  DAPTOmycin (CUBICIN) 500 mg in sodium chloride 0.9 % IVPB  Status:  Discontinued     500 mg 220 mL/hr over 30 Minutes Intravenous Every 48 hours 12/01/16 1559 12/01/16 1614   12/03/16 1400  DAPTOmycin (CUBICIN) 500 mg in sodium chloride 0.9 % IVPB     500 mg 220 mL/hr over 30 Minutes Intravenous Every 48 hours 12/03/16 1222     12/01/16 1700  DAPTOmycin (CUBICIN) 500 mg in sodium chloride 0.9 % IVPB  Status:  Discontinued     500 mg 220 mL/hr over 30 Minutes Intravenous Every 48 hours 12/01/16 1614 12/03/16 1222   12/01/16 1500  DAPTOmycin (CUBICIN) 523 mg in sodium chloride 0.9 % IVPB  Status:  Discontinued     8 mg/kg  65.4 kg 220.9 mL/hr over 30 Minutes Intravenous Every 48 hours 12/01/16 1217 12/01/16 1559       Objective: Vitals:   12/08/16 2038 12/09/16 0034 12/09/16 0456 12/09/16 0830  BP: 139/62 (!) 133/59 (!) 144/61 (!) 114/56  Pulse: 92 96 93 95  Resp: 20 18 20 20   Temp: 98.3 F (36.8 C) 98.1 F (36.7 C) 98.3 F (36.8 C) 97.9 F (36.6 C)  TempSrc: Oral Oral Oral Oral  SpO2: 99% 98% 98% 100%  Weight:      Height:       No intake or output data in the 24 hours ending 12/09/16 1153 Filed Weights   12/04/16 1758 12/04/16 2323 12/05/16 0305  Weight: 68.1 kg (150 lb 2.1 oz) 67.6 kg (149 lb 0.5 oz) 65.6 kg (144 lb 10 oz)    Examination: General exam:Continues to have a lot of pain with minimal movement HEENT: PERRLA, oral mucosa moist, no sclera icterus or thrush Respiratory system: Clear to auscultation. Respiratory effort normal. Cardiovascular system: S1 & S2 heard, RRR.  No murmurs  Gastrointestinal system: Abdomen soft, non-tender, nondistended. Normal bowel sound. No organomegaly Central nervous system: Alert and oriented. No focal neurological deficits. Extremities: No cyanosis, clubbing or edema MSK- tenderness in lower back noted.  Skin: No rashes or ulcers Psychiatry:  Mood & affect appropriate.     Data Reviewed: I have  personally reviewed following labs and imaging studies  CBC:  Recent Labs Lab 12/04/16 0545 12/04/16 2330 12/06/16 0135 12/07/16 0319 12/08/16 0609  WBC 7.8 8.1 7.9 8.4 7.5  HGB 10.3* 9.9* 9.8* 9.8* 9.9*  HCT 32.9* 31.0* 31.3* 31.4* 31.7*  MCV 94.5 93.7 94.8 95.4 94.1  PLT 212 221 202 197 186   Basic Metabolic Panel:  Recent Labs Lab 12/02/16 2003 12/03/16 0518 12/04/16 2340 12/07/16 0027 12/09/16 0535 12/09/16 1030  NA 134* 131* 130* 133* 134*  --   K 4.3 3.8 4.9 4.8 4.5  --   CL 96* 95* 93* 96* 96*  --   CO2 26 26 26 27 25   --   GLUCOSE 183* 174* 158* 159* 86  --   BUN 39* 19 47* 40* 40*  --   CREATININE 5.40* 3.24* 5.22* 4.95*  4.27*  --   CALCIUM 8.4* 8.2* 8.6* 8.6* 9.3  --   MG  --   --   --  2.2  --   --   PHOS 2.7  --  2.9  --   --  2.3*   GFR: Estimated Creatinine Clearance: 9.3 mL/min (A) (by C-G formula based on SCr of 4.27 mg/dL (H)). Liver Function Tests:  Recent Labs Lab 12/02/16 2003 12/03/16 0518 12/04/16 2340 12/09/16 0535  AST  --  20  --  23  ALT  --  12*  --  12*  ALKPHOS  --  73  --  63  BILITOT  --  0.6  --  0.6  PROT  --  6.4*  --  6.6  ALBUMIN 2.5* 2.8* 2.6* 2.7*   No results for input(s): LIPASE, AMYLASE in the last 168 hours. No results for input(s): AMMONIA in the last 168 hours. Coagulation Profile:  Recent Labs Lab 12/05/16 1106 12/06/16 0135 12/07/16 0319 12/08/16 0609 12/09/16 0535  INR 1.97 2.00 2.33 2.06 2.77   Cardiac Enzymes:  Recent Labs Lab 12/04/16 0911 12/08/16 0609  CKTOTAL 42 46   BNP (last 3 results) No results for input(s): PROBNP in the last 8760 hours. HbA1C: No results for input(s): HGBA1C in the last 72 hours. CBG:  Recent Labs Lab 12/08/16 0657 12/08/16 1135 12/08/16 1654 12/08/16 2125 12/09/16 0612  GLUCAP 77 126* 244* 192* 76   Lipid Profile: No results for input(s): CHOL, HDL, LDLCALC, TRIG, CHOLHDL, LDLDIRECT in the last 72 hours. Thyroid Function Tests: No results for  input(s): TSH, T4TOTAL, FREET4, T3FREE, THYROIDAB in the last 72 hours. Anemia Panel: No results for input(s): VITAMINB12, FOLATE, FERRITIN, TIBC, IRON, RETICCTPCT in the last 72 hours. Urine analysis:    Component Value Date/Time   COLORURINE AMBER (A) 08/09/2016 2204   APPEARANCEUR TURBID (A) 08/09/2016 2204   LABSPEC 1.014 08/09/2016 2204   PHURINE 6.0 08/09/2016 2204   GLUCOSEU NEGATIVE 08/09/2016 2204   HGBUR MODERATE (A) 08/09/2016 2204   BILIRUBINUR NEGATIVE 08/09/2016 2204   KETONESUR NEGATIVE 08/09/2016 2204   PROTEINUR 100 (A) 08/09/2016 2204   NITRITE NEGATIVE 08/09/2016 2204   LEUKOCYTESUR LARGE (A) 08/09/2016 2204   Sepsis Labs: @LABRCNTIP (procalcitonin:4,lacticidven:4) ) Recent Results (from the past 240 hour(s))  Body fluid culture     Status: None   Collection Time: 12/01/16  8:50 AM  Result Value Ref Range Status   Specimen Description FLUID  Final   Special Requests L2 3 DISC ASPIRATE SENT 2 SYRINGES  Final   Gram Stain   Final    RARE WBC PRESENT, PREDOMINANTLY MONONUCLEAR NO ORGANISMS SEEN    Culture NO GROWTH 3 DAYS  Final   Report Status 12/05/2016 FINAL  Final         Radiology Studies: No results found.    Scheduled Meds: . acetaminophen  650 mg Oral QID  . calcium acetate  1,334 mg Oral TID WC  . collagenase   Topical Daily  . darbepoetin (ARANESP) injection - DIALYSIS  60 mcg Intravenous Q Mon-HD  . diltiazem  120 mg Oral QHS  . famotidine  20 mg Oral QHS  . feeding supplement (ENSURE ENLIVE)  237 mL Oral TID BM  . gabapentin  100 mg Oral Once per day on Sun Tue Thu Sat  . [START ON 12/10/2016] gabapentin  100 mg Oral Once per day on Sun Tue Thu Sat  . gabapentin  300 mg Oral Once per day on  Mon Wed Fri  . insulin aspart  0-9 Units Subcutaneous TID WC  . insulin detemir  8 Units Subcutaneous QHS  . lidocaine  1 patch Transdermal Q24H  . multivitamin  1 tablet Oral Daily  . omega-3 acid ethyl esters  1,000 mg Oral Daily  . PARoxetine   40 mg Oral QHS  . pravastatin  20 mg Oral QPM  . senna-docusate  2 tablet Oral BID  . warfarin  2.5 mg Oral q1800  . Warfarin - Pharmacist Dosing Inpatient   Does not apply q1800   Continuous Infusions: . DAPTOmycin (CUBICIN)  IV Stopped (12/07/16 1956)     LOS: 11 days    Time spent in minutes: 35    Richarda Overlie, MD [facility] Pager: [email or web address] Password [facility] 12/09/2016, 11:53 AM

## 2016-12-09 NOTE — Progress Notes (Signed)
CM spoke to [facility] this am. They still dont have approval for the non formulary Daptomycin. Once they have approval they will order the medication. CM asked [name] with [facility] to see if she could assist with obtaining the medication. [name] states that the patients Medicare D will not cover the cost of the medication. [facility] to discuss if they are able to pay for [facility] to supply the medication.  CM also called Dr Jacky KindleAronson to see if there are any other resources to assist with expediting or assisting with the delay in obtaining the Daptomycin. CM spoke to DudleyJoseph at [facility] to see if they could supply at least [facility]. He is going to speak with his management and pharmacy and get back to CM tomorrow.

## 2016-12-09 NOTE — Progress Notes (Signed)
ANTICOAGULATION CONSULT NOTE - Follow Up Consult  Pharmacy Consult for Coumadin Indication: atrial fibrillation  Patient Measurements: Height: 5\' 5"  (165.1 cm) Weight:  (uto- pt in recliner and too unstable to stand on scale.) IBW/kg (Calculated) : 57 Last weight: 65.6 kg (7/7)  Vital Signs: Temp: 98.6 F (37 C) (07/11 1200) Temp Source: Oral (07/11 1200) BP: 130/71 (07/11 1200) Pulse Rate: 93 (07/11 1200)  Labs:  Recent Labs  12/07/16 0027 12/07/16 0319 12/08/16 0609 12/09/16 0535  HGB  --  9.8* 9.9*  --   HCT  --  31.4* 31.7*  --   PLT  --  197 186  --   LABPROT  --  26.0* 23.5* 29.8*  INR  --  2.33 2.06 2.77  CREATININE 4.95*  --   --  4.27*  CKTOTAL  --   --  46  --    ESRD  Assessment:  Warfarin as PTA for Afib. Was on hold for lumbar spine apiration. Following procedure, patient re-started warfarin (7/3) with concomitant Lovenox. Lovenox stopped 7/9 when INR therapeutic x 2.  INR remains therapeutic (2.77), though INR has trended p.    Home Coumadin regimen: 2.5 mg daily  Goal of Therapy:  INR 2-3 Monitor platelets by anticoagulation protocol: Yes   Plan:   Continue Coumadin 2.5 mg daily.  Daily PT/INR for now.  Dennie Fettersgan, Lagretta Loseke Donovan, [place] Pager: [phone] 12/09/2016,2:37 PM

## 2016-12-09 NOTE — Progress Notes (Signed)
Pt sent down for HD via bed

## 2016-12-09 NOTE — Progress Notes (Signed)
Physical Therapy Treatment Patient Details Name: [name] MRN: [id number] DOB: 08/12/1933 Today's Date: 12/09/2016    History of Present Illness Patient is an [age] y.o. female with medical history significant of ESRD dialysis on MWF, A.Fib on coumadin, HTN.  Patient has recently been battling diskitis / osteomyelitis of L2-L3.  Aspirate in March demonstrated GPC in pairs on GS but cultures without growth.  She was put on vancomycin with dialysis.  Despite this her back pain has worsened and she presented to [facility] ED [facility] once again today.  Work up there includes CT abd/pelvis which demonstrates worsening of her osteomyelitis. She is s/p disc aspiration on 12/01/16 at L2/3 level.     PT Comments    Pt seen for mobility progression and increasing tolerance to therapeutic interventions. Pt remains very limited secondary to pain and fatigue. Pt continues to require min-mod A x2 for ambulation with use of RW secondary to weakness and instability. Pt would continue to benefit from skilled physical therapy services at this time while admitted and after d/c to address the below listed limitations in order to improve overall safety and independence with functional mobility.   Follow Up Recommendations  SNF     Equipment Recommendations  None recommended by PT    Recommendations for Other Services       Precautions / Restrictions Precautions Precautions: Fall;Back Precaution Comments: PT reviewed log roll technique when pt returning to bed, with good demonstration from pt Required Braces or Orthoses: Spinal Brace Spinal Brace: Thoracolumbosacral orthotic;Applied in sitting position Spinal Brace Comments: Pt declining to wear TLSO secondary to pain Restrictions Weight Bearing Restrictions: No    Mobility  Bed Mobility Overal bed mobility: Needs Assistance Bed Mobility: Sit to Sidelying;Rolling Rolling: Supervision       Sit to sidelying: Min assist General bed mobility comments:  min assist to elevate LEs back to bed   Transfers Overall transfer level: Needs assistance Equipment used: Rolling walker (2 wheeled) Transfers: Sit to/from Stand Sit to Stand: Min assist;+2 safety/equipment         General transfer comment: increased time and effort, vc'ing for hand placement, assist to rise into standing from recliner chair and for stability  Ambulation/Gait Ambulation/Gait assistance: +2 physical assistance;Min assist;Mod assist Ambulation Distance (Feet): 10 Feet Assistive device: Rolling walker (2 wheeled) Gait Pattern/deviations: Step-through pattern;Decreased step length - right;Decreased step length - left;Decreased stride length;Shuffle Gait velocity: decreased Gait velocity interpretation: <1.8 ft/sec, indicative of risk for recurrent falls General Gait Details: pt very unsteady with ambulation this session, requiring constant min A and mod A x2 with LOB with directional changes. pt very limited secondary to pain and fatigue   Stairs            Wheelchair Mobility    Modified Rankin (Stroke Patients Only)       Balance Overall balance assessment: Needs assistance Sitting-balance support: Feet supported Sitting balance-Leahy Scale: Fair     Standing balance support: Bilateral upper extremity supported Standing balance-Leahy Scale: Poor Standing balance comment: pt reliant on bilateral UEs on RW and min A with static standing, mod A with dynamic                            Cognition Arousal/Alertness: Awake/alert Behavior During Therapy: Anxious Overall Cognitive Status: Within Functional Limits for tasks assessed  Exercises      General Comments        Pertinent Vitals/Pain Pain Assessment: Faces Faces Pain Scale: Hurts even more Pain Location: bilateral thighs Pain Descriptors / Indicators: Grimacing;Guarding Pain Intervention(s): Monitored during  session;Repositioned    Home Living                      Prior Function            PT Goals (current goals can now be found in the care plan section) Acute Rehab PT Goals PT Goal Formulation: With patient Time For Goal Achievement: 12/16/16 Potential to Achieve Goals: Fair Progress towards PT goals: Progressing toward goals    Frequency    Min 3X/week      PT Plan Current plan remains appropriate    Co-evaluation              AM-PAC PT "6 Clicks" Daily Activity  Outcome Measure  Difficulty turning over in bed (including adjusting bedclothes, sheets and blankets)?: A Lot Difficulty moving from lying on back to sitting on the side of the bed? : Total Difficulty sitting down on and standing up from a chair with arms (e.g., wheelchair, bedside commode, etc,.)?: Total Help needed moving to and from a bed to chair (including a wheelchair)?: A Lot Help needed walking in hospital room?: A Lot Help needed climbing 3-5 steps with a railing? : Total 6 Click Score: 9    End of Session Equipment Utilized During Treatment: Gait belt Activity Tolerance: Patient limited by pain;Patient limited by fatigue Patient left: in bed;with call bell/phone within reach;with bed alarm set Nurse Communication: Mobility status PT Visit Diagnosis: Muscle weakness (generalized) (M62.81);Unsteadiness on feet (R26.81);Other abnormalities of gait and mobility (R26.89);Pain Pain - Right/Left:  (bilateral) Pain - part of body: Leg (thighs)     Time: 1610-9604 PT Time Calculation (min) (ACUTE ONLY): 13 min  Charges:  $Therapeutic Activity: 8-22 mins                    G Codes:       [place], [place], [place] [phone]    [name] [name] 12/09/2016, 11:05 AM

## 2016-12-09 NOTE — Progress Notes (Signed)
S: CO chronic leg pain.  Eating OK O:BP (!) 114/56 (BP Location: Right Arm)   Pulse 95   Temp 97.9 F (36.6 C) (Oral)   Resp 20   Ht 5\' 5"  (1.651 m)   Wt 65.6 kg (144 lb 10 oz)   SpO2 100%   BMI 24.07 kg/m  No intake or output data in the 24 hours ending 12/09/16 1007 Weight change:  ZOX:WRUEA and alert VWU:JWJXB, irreg Resp:clear Abd:+ BS NTND Ext: No edema.  RUA AVF + bruit NEURO:CNI Ox2, no asterixis   . acetaminophen  650 mg Oral QID  . calcium acetate  1,334 mg Oral TID WC  . collagenase   Topical Daily  . darbepoetin (ARANESP) injection - DIALYSIS  60 mcg Intravenous Q Mon-HD  . diltiazem  120 mg Oral QHS  . famotidine  20 mg Oral QHS  . feeding supplement (ENSURE ENLIVE)  237 mL Oral TID BM  . gabapentin  100 mg Oral Once per day on Sun Tue Thu Sat  . [START ON 12/10/2016] gabapentin  100 mg Oral Once per day on Sun Tue Thu Sat  . gabapentin  300 mg Oral Once per day on Mon Wed Fri  . insulin aspart  0-9 Units Subcutaneous TID WC  . insulin detemir  8 Units Subcutaneous QHS  . lidocaine  1 patch Transdermal Q24H  . multivitamin  1 tablet Oral Daily  . omega-3 acid ethyl esters  1,000 mg Oral Daily  . PARoxetine  40 mg Oral QHS  . pravastatin  20 mg Oral QPM  . senna-docusate  2 tablet Oral BID  . warfarin  2.5 mg Oral q1800  . Warfarin - Pharmacist Dosing Inpatient   Does not apply q1800   No results found. BMET    Component Value Date/Time   NA 134 (L) 12/09/2016 0535   NA 137 07/04/2014 1303   K 4.5 12/09/2016 0535   K 3.3 (L) 07/04/2014 1303   CL 96 (L) 12/09/2016 0535   CL 100 07/04/2014 1303   CO2 25 12/09/2016 0535   CO2 29 07/04/2014 1303   GLUCOSE 86 12/09/2016 0535   GLUCOSE 76 07/04/2014 1303   BUN 40 (H) 12/09/2016 0535   BUN 44 (H) 07/04/2014 1303   CREATININE 4.27 (H) 12/09/2016 0535   CREATININE 2.72 (H) 07/04/2014 1303   CALCIUM 9.3 12/09/2016 0535   CALCIUM 8.9 07/04/2014 1303   GFRNONAA 9 (L) 12/09/2016 0535   GFRNONAA 18 (L)  07/04/2014 1303   GFRNONAA 29 (L) 12/02/2013 0847   GFRAA 10 (L) 12/09/2016 0535   GFRAA 22 (L) 07/04/2014 1303   GFRAA 34 (L) 12/02/2013 0847   CBC    Component Value Date/Time   WBC 7.5 12/08/2016 0609   RBC 3.37 (L) 12/08/2016 0609   HGB 9.9 (L) 12/08/2016 0609   HGB 14.2 12/03/2013 0820   HCT 31.7 (L) 12/08/2016 0609   HCT 42.7 12/03/2013 0820   PLT 186 12/08/2016 0609   PLT 214 12/03/2013 0820   MCV 94.1 12/08/2016 0609   MCV 94 12/03/2013 0820   MCH 29.4 12/08/2016 0609   MCHC 31.2 12/08/2016 0609   RDW 17.3 (H) 12/08/2016 0609   RDW 17.1 (H) 12/03/2013 0820   LYMPHSABS 0.8 (L) 08/09/2016 2011   LYMPHSABS 1.1 12/03/2013 0820   MONOABS 0.7 08/09/2016 2011   MONOABS 0.9 12/03/2013 0820   EOSABS 0.2 08/09/2016 2011   EOSABS 0.2 12/03/2013 0820   BASOSABS 0.1 08/09/2016 2011   BASOSABS  0.1 12/03/2013 0820     Assessment:  1. ESRD On HD [facility] MWF 2. Osteomyelitis/disckitis L2-3 on Daptomycin 3. HTN 4. Chronic A fib on coumadin 5. DM 6. Anemia on Aranesp    Plan: 1. HD today 2. Add PO4 to labs done today   Chauntelle Azpeitia T

## 2016-12-09 NOTE — Progress Notes (Signed)
HD tx initiated via 15Gx2 w/o problem, pull/push/flush equally w/o problem, VSS, will cont to monitor while on HD tx 

## 2016-12-09 NOTE — Progress Notes (Signed)
Report was given to dialysis nurse Bobbie. Awaiting call for pick up.

## 2016-12-10 DIAGNOSIS — M86019 Acute hematogenous osteomyelitis, unspecified shoulder: Secondary | ICD-10-CM

## 2016-12-10 LAB — GLUCOSE, CAPILLARY
GLUCOSE-CAPILLARY: 105 mg/dL — AB (ref 65–99)
Glucose-Capillary: 49 mg/dL — ABNORMAL LOW (ref 65–99)
Glucose-Capillary: 62 mg/dL — ABNORMAL LOW (ref 65–99)
Glucose-Capillary: 154 mg/dL — ABNORMAL HIGH (ref 65–99)

## 2016-12-10 LAB — PROTIME-INR
INR: 2.51
Prothrombin Time: 27.5 seconds — ABNORMAL HIGH (ref 11.4–15.2)

## 2016-12-10 MED ORDER — TRAMADOL HCL 50 MG PO TABS
50.0000 mg | ORAL_TABLET | Freq: Four times a day (QID) | ORAL | 0 refills | Status: AC | PRN
Start: 2016-12-10 — End: ?

## 2016-12-10 MED ORDER — GLUCOSE 40 % PO GEL
ORAL | Status: AC
  Filled 2016-12-10: qty 1

## 2016-12-10 MED ORDER — SODIUM CHLORIDE 0.9 % IV SOLN: 500.0000 mg | INTRAVENOUS | 0 refills | Status: AC

## 2016-12-10 MED ORDER — OXYCODONE HCL 5 MG PO TABS
5.0000 mg | ORAL_TABLET | ORAL | 0 refills | Status: AC | PRN
Start: 2016-12-10 — End: ?

## 2016-12-10 NOTE — Progress Notes (Signed)
Noted pt had legs out of the bed and dangling at the side of bed, redirected and asked pt to put leg back in bed ,pt denied pain repositioned in bed. No acute distress. Will continue to monitor

## 2016-12-10 NOTE — Progress Notes (Signed)
Pt still in HD unit not back to unit

## 2016-12-10 NOTE — Progress Notes (Signed)
ANTICOAGULATION CONSULT NOTE - Follow Up Consult  Pharmacy Consult for Coumadin Indication: atrial fibrillation  Allergies  Allergen Reactions  . Ceftazidime Other (See Comments)    myoclonus  . Codeine Itching and Other (See Comments)    Reaction:  Confusion/Hallucinations   . Contrast Media [Iodinated Diagnostic Agents] Other (See Comments)    "skin peel" breaks into a blistered rash, then peel. If given Benadryl and solumedrol she will be okay  . Oxycodone Other (See Comments)    Reaction:  Confusion/Hallucinations   . Quinine Derivatives Rash  . Tape Other (See Comments)    Skin is very thin and will blister and pull off skin with any tape except paper tape.  PLEASE USE PAPER TAPE.    Patient Measurements: Height: 5\' 5"  (165.1 cm) Weight: 140 lb 10.5 oz (63.8 kg) IBW/kg (Calculated) : 57 Last Weight: 63.8 (7/12)  Vital Signs: Temp: 98.6 F (37 C) (07/12 0836) Temp Source: Oral (07/12 0836) BP: 134/52 (07/12 0836) Pulse Rate: 87 (07/12 0836)  Labs:  Recent Labs  12/08/16 0609 12/09/16 0535 12/09/16 2205 12/10/16 0510  HGB 9.9*  --  9.7*  --   HCT 31.7*  --  30.6*  --   PLT 186  --  226  --   LABPROT 23.5* 29.8*  --  27.5*  INR 2.06 2.77  --  2.51  CREATININE  --  4.27* 4.84*  --   CKTOTAL 46  --   --   --     Estimated Creatinine Clearance: 8.2 mL/min (A) (by C-G formula based on SCr of 4.84 mg/dL (H)).  Assessment: Warfarin PTA for Afib. Warfarin was on hold for lumbar spine aspiration and restarted (7/3) with concomitant Lovenox. Lovenox discontinued (7/9) when INR therapeutic x2.   INR remains therapeutic at 2.51 today.   Home Regimen: Warfarin 2.5 mg PO daily    Goal of Therapy:  INR 2-3 Monitor platelets by anticoagulation protocol: Yes   Plan:  Continue warfarin 2.5 mg PO daily Daily INR   Hershal Coriaiana L Raymond, PharmD, MS PGY1 Pharmacy Resident  Pager: [phone] 12/10/2016,10:17 AM

## 2016-12-10 NOTE — Care Management Note (Signed)
Case Management Note  Patient Details  Name: [name] MRN: [id number] Date of Birth: [birth year]  Subjective/Objective:                    Action/Plan: CM spoke to [facility] and they have the patients Daptomycin in stock. CM faxed over the new orders for Daptomycin with HD on MWF.  CM notified MD and CSW.  Pt discharging to [facility] today.   Expected Discharge Date:  12/10/16               Expected Discharge Plan:  Skilled Nursing Facility  In-House Referral:  Clinical Social Work  Discharge planning Services     Post Acute Care Choice:    Choice offered to:     DME Arranged:    DME Agency:     HH Arranged:    HH Agency:     Status of Service:  Completed, signed off  If discussed at [employer] Meetings, dates discussed:    Additional Comments:  Kermit BaloKelli F Madalee Altmann, RN 12/10/2016, 11:21 AM

## 2016-12-10 NOTE — Progress Notes (Signed)
HD tx completed @ 7414 w/o problem, UF goal met, blood rinsed back, report called to Sharon Mt, rn

## 2016-12-10 NOTE — Progress Notes (Signed)
Hypoglycemic Event  CBG: 49  Treatment:  120 cc but pt took on 60 ml OJ  Symptoms:  Increased confused  pt already  confused  Follow-up CBG: Time:0605 CBG Result:62  Possible Reasons for Event: pt refused po snack  Comments/MD notified To give Po juice or glucose gel per protocol  0720 rechecked CBG and 105 d/l at0720    [name], [name]

## 2016-12-10 NOTE — Progress Notes (Addendum)
Discharge to: [facility] Anticipated discharge date: 12/10/16 Family notified: Yes, by phone Transportation by: [facility]  Report #: [id number], Room 701; ask for Konrad DoloresKim Hicks  CSW signing off.  Blenda Nicelylizabeth Davine Coba LCSW [phone]

## 2016-12-10 NOTE — Progress Notes (Signed)
Pt returned from HD at 0140 remain confused reoriented to place and time  Po ensure offered but pt refused. Made comfortable in bed. Will continue to monitor.

## 2016-12-10 NOTE — Progress Notes (Signed)
Pt discharged to [facility] taking all personal belongings. IV discontinued, dry dressing applied. No noted distress. Report called in to nurse, Konrad DoloresKim Hicks.

## 2016-12-10 NOTE — Clinical Social Work Placement (Signed)
   CLINICAL SOCIAL WORK PLACEMENT  NOTE  Date:  12/10/2016  Patient Details  Name: [name] MRN: [id number] Date of Birth: 05-25-1934  Clinical Social Work is seeking post-discharge placement for this patient at the Skilled  Nursing Facility level of care (*CSW will initial, date and re-position this form in  chart as items are completed):      Patient/family provided with [facility] Clinical Social Work Department's list of facilities offering this level of care within the geographic area requested by the patient (or if unable, by the patient's family).      Patient/family informed of their freedom to choose among providers that offer the needed level of care, that participate in Medicare, Medicaid or managed care program needed by the patient, have an available bed and are willing to accept the patient.      Patient/family informed of [facility]'s ownership interest in [facility] and [facility], as well as of the fact that they are under no obligation to receive care at these facilities.  PASRR submitted to EDS on       PASRR number received on       Existing PASRR number confirmed on       FL2 transmitted to all facilities in geographic area requested by pt/family on       FL2 transmitted to all facilities within larger geographic area on       Patient informed that his/her managed care company has contracts with or will negotiate with certain facilities, including the following:            Patient/family informed of bed offers received.  [facility]     Physician recommends and patient chooses bed at      Patient to be transferred to [facility] [place] on 12/10/16.  Patient to be transferred to facility by [facility]     Patient family notified on 12/10/16 of transfer.  Name of family member notified:  [name]     PHYSICIAN       Additional Comment:    _______________________________________________ Baldemar LenisElizabeth M  Erminie Foulks, LCSW 12/10/2016, 11:20 AM

## 2016-12-10 NOTE — Discharge Summary (Addendum)
Physician Discharge Summary  [name] MRN: [id number] DOB/AGE: 1934-05-06 83 y.o.  PCP: Hortencia Pilar, MD   Admit date: 11/28/2016 Discharge date: 12/10/2016  Discharge Diagnoses:    Principal Problem:   Osteomyelitis of lumbar spine [facility]) Active Problems:   ESRD on dialysis ([place])   Diabetes mellitus ([place])   Chronic atrial fibrillation ([place])   Acute osteomyelitis of right calcaneus ([place])   Diabetic ulcer of right heel associated with diabetes mellitus due to underlying condition, with necrosis of bone ([place])        Follow-up recommendations Follow-up with PCP in 3-5 days , including all  additional recommended appointments as below Follow-up CBC, CMP in 3-5 days Please check INR  with hemodialysis Continue IV daptomycin after hemodialysis on Monday Wednesday Friday until 8/22 Patient to follow-up with Dr. Ola Spurr, infectious disease in [facility]      Current Discharge Medication List    START taking these medications   Details  DAPTOmycin 500 mg in sodium chloride 0.9 % 100 mL Inject 500 mg into the vein every Monday, Wednesday, and Friday with hemodialysis. Qty: 42 ampule, Refills: 0    enoxaparin (LOVENOX) 80 MG/0.8ML injection Inject 0.65 mLs (65 mg total) into the skin daily. Qty: 7 Syringe, Refills: 0    feeding supplement, ENSURE ENLIVE, (ENSURE ENLIVE) LIQD Take 237 mLs by mouth 2 (two) times daily between meals. Qty: 237 mL, Refills: 12    gabapentin (NEURONTIN) 600 MG tablet Take 0.5 tablets (300 mg total) by mouth at bedtime. Qty: 30 tablet, Refills: 1      CONTINUE these medications which have CHANGED   Details  oxyCODONE (OXY IR/ROXICODONE) 5 MG immediate release tablet Take 1 tablet (5 mg total) by mouth every 4 (four) hours as needed for severe pain. Qty: 10 tablet, Refills: 0    traMADol (ULTRAM) 50 MG tablet Take 1 tablet (50 mg total) by mouth every 6 (six) hours as needed for moderate pain. Qty: 10 tablet, Refills: 0      CONTINUE  these medications which have NOT CHANGED   Details  acetaminophen (TYLENOL) 650 MG CR tablet Take 1,300 mg by mouth 2 (two) times daily.    albuterol (PROVENTIL HFA;VENTOLIN HFA) 108 (90 Base) MCG/ACT inhaler Inhale 1 puff into the lungs every 6 (six) hours as needed for wheezing or shortness of breath.     Amino Acids-Protein Hydrolys (FEEDING SUPPLEMENT, PRO-STAT SUGAR FREE 64,) LIQD Take 30 mLs by mouth 3 (three) times daily with meals.    B Complex-C-Folic Acid (RENAL-VITE) 0.8 MG TABS Take 0.8 mg by mouth daily.    Biotin 1 MG CAPS Take 1 mg by mouth daily.     calcium acetate (PHOSLO) 667 MG tablet Take 2,001 mg by mouth 3 (three) times daily with meals. With any food    cyclobenzaprine (FLEXERIL) 5 MG tablet Take 1 tablet (5 mg total) by mouth 3 (three) times daily as needed for muscle spasms. Qty: 10 tablet, Refills: 0    diltiazem (CARDIZEM CD) 120 MG 24 hr capsule Take 120 mg by mouth daily at 6 PM.     insulin detemir (LEVEMIR) 100 UNIT/ML injection Inject 0.06 mLs (6 Units total) into the skin 2 (two) times daily. Qty: 10 mL, Refills: 11    lidocaine (LIDODERM) 5 % Place 1 patch onto the skin daily. Remove & Discard patch within 12 hours or as directed by MD Qty: 2 patch, Refills: 0    lidocaine-prilocaine (EMLA) cream Apply 1 application topically See  admin instructions. Apply to dialysis port on Monday, Wednesday and Friday at 10am (before dialysis)    Multiple Vitamin (MULTIVITAMIN WITH MINERALS) TABS tablet Take 1 tablet by mouth daily at 6 PM.    Omega-3 Fatty Acids (FISH OIL) 1000 MG CAPS Take 1,000 mg by mouth 2 (two) times daily.    OXYGEN Inhale 2 L into the lungs continuous.    PARoxetine (PAXIL) 40 MG tablet Take 40 mg by mouth at bedtime.     polyethylene glycol (MIRALAX / GLYCOLAX) packet Take 17 g by mouth daily as needed (constipation). Mix in 4-8 oz liquid and drink    pravastatin (PRAVACHOL) 20 MG tablet Take 20 mg by mouth at bedtime.      ranitidine (ZANTAC) 150 MG tablet Take 150 mg by mouth daily at 6 PM.     senna-docusate (SENOKOT-S) 8.6-50 MG tablet Take 2 tablets by mouth 2 (two) times daily. Qty: 60 tablet, Refills: 0    warfarin (COUMADIN) 2.5 MG tablet Take 2.5 mg by mouth daily at 6 PM.       STOP taking these medications     furosemide (LASIX) 80 MG tablet      gabapentin (NEURONTIN) 100 MG capsule      gabapentin (NEURONTIN) 300 MG capsule      vancomycin (VANCOCIN) 1-5 QQ/229NL-% SOLN      folic acid-vitamin b complex-vitamin c-selenium-zinc (DIALYVITE) 3 MG TABS tablet      gabapentin (NEURONTIN) 300 MG capsule            Discharge Condition:Stable Discharge Instructions Get Medicines reviewed and adjusted: Please take all your medications with you for your next visit with your Primary MD  Please request your Primary MD to go over all hospital tests and procedure/radiological results at the follow up, please ask your Primary MD to get all Hospital records sent to his/her office.  If you experience worsening of your admission symptoms, develop shortness of breath, life threatening emergency, suicidal or homicidal thoughts you must seek medical attention immediately by calling 911 or calling your MD immediately if symptoms less severe.  You must read complete instructions/literature along with all the possible adverse reactions/side effects for all the Medicines you take and that have been prescribed to you. Take any new Medicines after you have completely understood and accpet all the possible adverse reactions/side effects.   Do not drive when taking Pain medications.   Do not take more than prescribed Pain, Sleep and Anxiety Medications  Special Instructions: If you have smoked or chewed Tobacco in the last 2 yrs please stop smoking, stop any regular Alcohol and or any Recreational drug use.  Wear Seat belts while driving.  Please note  You were cared for by a hospitalist during your  hospital stay. Once you are discharged, your primary care physician will handle any further medical issues. Please note that NO REFILLS for any discharge medications will be authorized once you are discharged, as it is imperative that you return to your primary care physician (or establish a relationship with a primary care physician if you do not have one) for your aftercare needs so that they can reassess your need for medications and monitor your lab values.  Discharge Instructions    Diet - low sodium heart healthy    Complete by:  As directed    Increase activity slowly    Complete by:  As directed        Allergies  Allergen Reactions  . Ceftazidime Other (  See Comments)    myoclonus  . Codeine Itching and Other (See Comments)    Reaction:  Confusion/Hallucinations   . Contrast Media [Iodinated Diagnostic Agents] Other (See Comments)    "skin peel" breaks into a blistered rash, then peel. If given Benadryl and solumedrol she will be okay  . Oxycodone Other (See Comments)    Reaction:  Confusion/Hallucinations   . Quinine Derivatives Rash  . Tape Other (See Comments)    Skin is very thin and will blister and pull off skin with any tape except paper tape.  PLEASE USE PAPER TAPE.      Disposition: SNF   Consults:  Infectious disease    Significant Diagnostic Studies:  Ct Abdomen Pelvis Wo Contrast  Result Date: 11/27/2016 CLINICAL DATA:  Acute on chronic low back pain, abdominal for 2 weeks after dialysis. History of hypertension, diabetes, cholecystectomy, and lumbar discitis osteomyelitis. EXAM: CT ABDOMEN AND PELVIS WITHOUT CONTRAST TECHNIQUE: Multidetector CT imaging of the abdomen and pelvis was performed following the standard protocol without IV contrast. Oral contrast administered. COMPARISON:  CT abdomen and pelvis August 09, 2016 and Oct 12, 2015 FINDINGS: LOWER CHEST: Small pleural effusions. Stable 8 mm and 7 mm RIGHT middle lobe pulmonary nodules stable from Oct 12, 2015. Bibasilar atelectasis. The heart is mildly enlarged. Severe mitral annular calcifications. No pericardial effusion. HEPATOBILIARY: Mildly dense and slightly nodular liver. Status post cholecystectomy. PANCREAS: Normal. SPLEEN: Normal. ADRENALS/URINARY TRACT: Kidneys are orthotopic, Atrophic kidneys bilaterally. Stable 13 mm RIGHT upper pole calcifications without obstructive uropathy. Limited assessment for mass on noncontrast CT. Thickened adrenal gland compatible with hyperplasia decompressed urinary bladder predominately obscured. STOMACH/BOWEL: Small hiatal hernia. The stomach, small and large bowel are normal in course and caliber without inflammatory changes. Mild colonic diverticulosis. Normal appendix. VASCULAR/LYMPHATIC: Aortoiliac vessels are normal in course and caliber, severe. No lymphadenopathy by CT size criteria. REPRODUCTIVE: Normal. OTHER: No intraperitoneal free fluid or free air. MUSCULOSKELETAL: Increasing disc widening L2-3 associated with endplate erosion and sclerosis. Severe canal stenosis at L2-3. Bilateral hip total arthroplasties resultant streak artifact limiting assessment of the pelvis. RIGHT sciatic nerve stimulator. Trophic pelvic musculature. Small fat containing umbilical hernia. IMPRESSION: 1. No acute intra-abdominopelvic process by noncontrast CT. 2. Worsening L2-3 discitis osteomyelitis. 3. 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 4. Thirteen month stability RIGHT middle lobe pulmonary nodules. Recommend follow-up CT chest in 1 year to complete standard recommendation for 2 year stability. Aortic Atherosclerosis (ICD10-I70.0). Electronically Signed   By: Elon Alas M.D.   On: 11/27/2016 19:37   Ir Fluoro Guide Ndl Plmt / Bx  Result Date: 12/03/2016 INDICATION: Severe low back pain secondary to L2-L3 diskitis/osteomyelitis. EXAM: FLUORO GUIDED DISC ASPIRATION MEDICATIONS: None. ANESTHESIA/SEDATION: Moderate (conscious) sedation was employed during this procedure. A  total of Versed 2 mg and Fentanyl 50 mcg was administered intravenously. Moderate Sedation Time: 13 minutes. The patient's level of consciousness and vital signs were monitored continuously by radiology nursing throughout the procedure under my direct supervision. FLUOROSCOPY TIME:  Fluoroscopy Time: 4 minutes 54 seconds (443 mGy). COMPLICATIONS: None immediate. PROCEDURE: Informed written consent was obtained from the patient after a thorough discussion of the procedural risks, benefits and alternatives. All questions were addressed. Maximal Sterile Barrier Technique was utilized including caps, mask, sterile gowns, sterile gloves, sterile drape, hand hygiene and skin antiseptic. A timeout was performed prior to the initiation of the procedure. The patient was laid prone on the fluoroscopic table. The skin overlying the lumbar region was then prepped and draped in  the usual sterile fashion. The skin entry site at L2-L3 in the right paramedian region was then infiltrated with 0.25% bupivacaine and carried into the underlying paraspinous muscles. Thereafter using biplane intermittent fluoroscopy, a 21 gauge Franseen needle was advanced into the L2-L3 disc space. Using a 20 mL syringe, approximately 2 mL of thick, dirty, bloody aspirate was obtained and sent for microbiologic analysis. Another pass was also made at L2-L3 using a separate new Franseen 21 biopsy needle. Again using a 20 mL syringe, approximately 2 mL of thick, dirty, bloody aspirate was obtained. This too was sent for microbiologic analysis. Hemostasis was achieved at the skin entry site. The patient tolerated the procedure well. IMPRESSION: Status post fluoroscopic guided needle placement at L2-L3 disc space for aspiration. Electronically Signed   By: Luanne Bras M.D.   On: 12/01/2016 10:08   Dg Foot 2 Views Right  Result Date: 11/29/2016 CLINICAL DATA:  Osteomyelitis.  No additional history provided. EXAM: RIGHT FOOT - 2 VIEW COMPARISON:   Calcaneal radiographs 01/21/2016 FINDINGS: Post prior resection of the fourth toe at the proximal phalanx. Resection margins appear smooth. Advanced chronic change about the midfoot with loss of normal alignment, multifocal cystic change in some proliferation. No frank bony destruction. Mild smooth periosteal thickening of the second through fourth metatarsals has a chronic appearance. Previous skin defect about the heel is less prevalent radiographically. Adjacent linear density in the heel in the region of previous skin defect is unchanged, has similar density to adjacent vascular calcifications. Diffuse bony under mineralization. There are dense vascular calcifications. No radiopaque foreign body. IMPRESSION: 1. No radiographic findings to suggest acute osteomyelitis. 2. Chronic change about the midfoot which may be a osteoarthritis or Charcot foot. No prior exams available for comparison. Electronically Signed   By: Jeb Levering M.D.   On: 11/29/2016 19:28    echocardiogram       Filed Weights   12/09/16 2140 12/10/16 0122  Weight: 66.8 kg (147 lb 4.3 oz) 63.8 kg (140 lb 10.5 oz)     Microbiology: Recent Results (from the past 240 hour(s))  Body fluid culture     Status: None   Collection Time: 12/01/16  8:50 AM  Result Value Ref Range Status   Specimen Description FLUID  Final   Special Requests L2 3 DISC ASPIRATE SENT 2 SYRINGES  Final   Gram Stain   Final    RARE WBC PRESENT, PREDOMINANTLY MONONUCLEAR NO ORGANISMS SEEN    Culture NO GROWTH 3 DAYS  Final   Report Status 12/05/2016 FINAL  Final       Blood Culture    Component Value Date/Time   SDES FLUID 12/01/2016 0850   SPECREQUEST L2 3 DISC ASPIRATE SENT 2 SYRINGES 12/01/2016 0850   CULT NO GROWTH 3 DAYS 12/01/2016 0850   REPTSTATUS 12/05/2016 FINAL 12/01/2016 0850      Labs: Results for orders placed or performed during the hospital encounter of 11/28/16 (from the past 48 hour(s))  Glucose, capillary      Status: Abnormal   Collection Time: 12/08/16 11:35 AM  Result Value Ref Range   Glucose-Capillary 126 (H) 65 - 99 mg/dL   Comment 1 Notify RN    Comment 2 Document in Chart   Glucose, capillary     Status: Abnormal   Collection Time: 12/08/16  4:54 PM  Result Value Ref Range   Glucose-Capillary 244 (H) 65 - 99 mg/dL   Comment 1 Notify RN    Comment 2 Document in  Chart   Glucose, capillary     Status: Abnormal   Collection Time: 12/08/16  9:25 PM  Result Value Ref Range   Glucose-Capillary 192 (H) 65 - 99 mg/dL   Comment 1 Notify RN    Comment 2 Document in Chart   Protime-INR     Status: Abnormal   Collection Time: 12/09/16  5:35 AM  Result Value Ref Range   Prothrombin Time 29.8 (H) 11.4 - 15.2 seconds   INR 2.77   Comprehensive metabolic panel     Status: Abnormal   Collection Time: 12/09/16  5:35 AM  Result Value Ref Range   Sodium 134 (L) 135 - 145 mmol/L   Potassium 4.5 3.5 - 5.1 mmol/L   Chloride 96 (L) 101 - 111 mmol/L   CO2 25 22 - 32 mmol/L   Glucose, Bld 86 65 - 99 mg/dL   BUN 40 (H) 6 - 20 mg/dL   Creatinine, Ser 4.27 (H) 0.44 - 1.00 mg/dL   Calcium 9.3 8.9 - 10.3 mg/dL   Total Protein 6.6 6.5 - 8.1 g/dL   Albumin 2.7 (L) 3.5 - 5.0 g/dL   AST 23 15 - 41 U/L   ALT 12 (L) 14 - 54 U/L   Alkaline Phosphatase 63 38 - 126 U/L   Total Bilirubin 0.6 0.3 - 1.2 mg/dL   GFR calc non Af Amer 9 (L) >60 mL/min   GFR calc Af Amer 10 (L) >60 mL/min    Comment: (NOTE) The eGFR has been calculated using the CKD EPI equation. This calculation has not been validated in all clinical situations. eGFR's persistently <60 mL/min signify possible Chronic Kidney Disease.    Anion gap 13 5 - 15  Glucose, capillary     Status: None   Collection Time: 12/09/16  6:12 AM  Result Value Ref Range   Glucose-Capillary 76 65 - 99 mg/dL   Comment 1 Notify RN    Comment 2 Document in Chart   Phosphorus     Status: Abnormal   Collection Time: 12/09/16 10:30 AM  Result Value Ref Range    Phosphorus 2.3 (L) 2.5 - 4.6 mg/dL  Glucose, capillary     Status: Abnormal   Collection Time: 12/09/16 12:11 PM  Result Value Ref Range   Glucose-Capillary 131 (H) 65 - 99 mg/dL   Comment 1 Notify RN    Comment 2 Document in Chart   Glucose, capillary     Status: Abnormal   Collection Time: 12/09/16  8:27 PM  Result Value Ref Range   Glucose-Capillary 127 (H) 65 - 99 mg/dL   Comment 1 Notify RN    Comment 2 Document in Chart   CBC     Status: Abnormal   Collection Time: 12/09/16 10:05 PM  Result Value Ref Range   WBC 9.0 4.0 - 10.5 K/uL   RBC 3.30 (L) 3.87 - 5.11 MIL/uL   Hemoglobin 9.7 (L) 12.0 - 15.0 g/dL   HCT 30.6 (L) 36.0 - 46.0 %   MCV 92.7 78.0 - 100.0 fL   MCH 29.4 26.0 - 34.0 pg   MCHC 31.7 30.0 - 36.0 g/dL   RDW 17.5 (H) 11.5 - 15.5 %   Platelets 226 150 - 400 K/uL  Renal function panel     Status: Abnormal   Collection Time: 12/09/16 10:05 PM  Result Value Ref Range   Sodium 132 (L) 135 - 145 mmol/L   Potassium 4.9 3.5 - 5.1 mmol/L   Chloride 94 (L)  101 - 111 mmol/L   CO2 26 22 - 32 mmol/L   Glucose, Bld 150 (H) 65 - 99 mg/dL   BUN 47 (H) 6 - 20 mg/dL   Creatinine, Ser 4.84 (H) 0.44 - 1.00 mg/dL   Calcium 9.5 8.9 - 10.3 mg/dL   Phosphorus 2.7 2.5 - 4.6 mg/dL   Albumin 2.7 (L) 3.5 - 5.0 g/dL   GFR calc non Af Amer 8 (L) >60 mL/min   GFR calc Af Amer 9 (L) >60 mL/min    Comment: (NOTE) The eGFR has been calculated using the CKD EPI equation. This calculation has not been validated in all clinical situations. eGFR's persistently <60 mL/min signify possible Chronic Kidney Disease.    Anion gap 12 5 - 15  Protime-INR     Status: Abnormal   Collection Time: 12/10/16  5:10 AM  Result Value Ref Range   Prothrombin Time 27.5 (H) 11.4 - 15.2 seconds   INR 2.51   Glucose, capillary     Status: Abnormal   Collection Time: 12/10/16  6:05 AM  Result Value Ref Range   Glucose-Capillary 49 (L) 65 - 99 mg/dL   Comment 1 Notify RN    Comment 2 Document in Chart    Glucose, capillary     Status: Abnormal   Collection Time: 12/10/16  6:45 AM  Result Value Ref Range   Glucose-Capillary 62 (L) 65 - 99 mg/dL  Glucose, capillary     Status: Abnormal   Collection Time: 12/10/16  7:20 AM  Result Value Ref Range   Glucose-Capillary 105 (H) 65 - 99 mg/dL     Lipid Panel     Component Value Date/Time   CHOL 125 06/19/2015 0616   CHOL 119 06/20/2013 0540   TRIG 144 06/19/2015 0616   TRIG 95 06/20/2013 0540   HDL 35 (L) 06/19/2015 0616   HDL 57 06/20/2013 0540   CHOLHDL 3.6 06/19/2015 0616   VLDL 29 06/19/2015 0616   VLDL 19 06/20/2013 0540   LDLCALC 61 06/19/2015 0616   LDLCALC 43 06/20/2013 0540     Lab Results  Component Value Date   HGBA1C 5.5 11/30/2016   HGBA1C 5.1 08/10/2016   HGBA1C 6.6 (H) 06/19/2015      HPI :  Natalie Wille Hopkinsis a 83 y.o.femalewith medical history significant of ESRD dialysis on MWF, A.Fib on coumadin, HTN. Patient is being treated for diskitis / osteomyelitis of L2-L3 with Vancomycin with dialysis. Aspirate in March demonstrated GPC in pairs on GS but cultures without growth. Despite treatment, her back pain has worsened and she presented to the ED at [facility].   CT reveals worsening L2-L3 discitis/ osteomyelitis with severe canal stenosis.  She was seen by ID on 6/29.   HOSPITAL COURSE:   Discitis andOsteomyelitis of lumbar spine  worsening L2-L3 discitis/ osteomyelitis with severe canal stenosis on CT on 6/29 consulted ID - previously on Vanc,  started daptomycin 7/3 , follow-up cultures of lumbar aspirate, Gram stain continue to be negative, continue daptomycin with hemodialysis for 6 weeks as perDr. Megan Salon Status post L2-L3 disc aspiration on 12/01/16 Patient encouraged to wear   TLSO brace Continue current pain regimen, hold for sedation ID Dr Drucilla Schmidt  ordered ankle-brachial index study on 11/29/16, which was abnormal,Right TBI of 0.14 and left TBI of 0.36 is suggestive of abnormal arterial flow at rest,  Unable to obtain bilateral ABI's due to non-compressible arteries likely due to  calcification Patient will need follow-up with with Dr. Ola Spurr in  [place]   Chronic atrial fibrillation  -CHA2DS2-VASc Score at least 6 -Coumadin held for aspiration of disc space- placed initially oniv Heparin until INR became therapeutic cont Cardizem -rate controlled INR now therapeutic     Anticoagulation management Continue Coumadin per pharmacy, INR therapeutic    ESRD on dialysis , currently Tuesday Thursday Saturday Nephrology converted to Monday Wednesday Friday schedule Last hemodialysis 7/11  Early cirrhosis - noted on CT 6/29  Right middle lobe pulm nodules - stable from last imaging 13 mo ago-f/u Ct in 1 yr  dCHF- chronic Patient was onlasix 80 BID as outpt - does not make urine and therefore discontinued - mod conc hypertrophy on last ECHO 1/17  Diabetes mellitus 2 Reduce Levemir , hypoglycemic at 6:30 this morning  Hypokalemia - resolved  Hyponatremia - correct with dialysis / per nephro  Right sciatic nerve stimulator    Discharge Exam:   Blood pressure (!) 134/52, pulse 87, temperature 98.6 F (37 C), temperature source Oral, resp. rate 16, height _0  (1.651 m), weight 63.8 kg (140 lb 10.5 oz), SpO2 98 %.  General exam: Appears comfortable today HEENT: PERRLA, oral mucosa moist, no sclera icterus or thrush Respiratory system: Clear to auscultation. Respiratory effort normal. Cardiovascular system: S1 & S2 heard, RRR.  No murmurs  Gastrointestinal system: Abdomen soft, non-tender, nondistended. Normal bowel sound. No organomegaly Central nervous system: Alert and oriented. No focal neurological deficits. Extremities: No cyanosis, clubbing or edema MSK- tenderness in lower back noted.  Skin: No rashes or ulcers Psychiatry:  Mood & affect appropriate.     Follow-up Information    Hortencia Pilar, MD. Call.   Specialty:  Family  Medicine Why:  Hospital follow-up in 3-5 days Contact information: [place] [address] [phone]        Michel Bickers, MD. Call.   Specialty:  Infectious Diseases Why:  To make appointment, follow-up in 4-6 weeks Contact information: [address] [place] [address] [phone]           Signed: Reyne Dumas 12/10/2016, 10:47 AM        Time spent >1 hour

## 2016-12-10 NOTE — Progress Notes (Signed)
S: No new CO except constipation O:BP (!) 134/52 (BP Location: Left Arm)   Pulse 87   Temp 98.6 F (37 C) (Oral)   Resp 16   Ht 5\' 5"  (1.651 m)   Wt 63.8 kg (140 lb 10.5 oz)   SpO2 98%   BMI 23.41 kg/m   Intake/Output Summary (Last 24 hours) at 12/10/16 0925 Last data filed at 12/10/16 0844  Gross per 24 hour  Intake              690 ml  Output             3000 ml  Net            -2310 ml   Weight change:  WJX:BJYNWGen:Awake and alert GNF:AOZHYCVS:irreg, irreg Resp:clear Abd:+ BS NTND Ext: No edema.  RUA AVF + bruit NEURO:CNI Ox3, no asterixis   . dextrose      . acetaminophen  650 mg Oral QID  . calcium acetate  1,334 mg Oral TID WC  . collagenase   Topical Daily  . darbepoetin (ARANESP) injection - DIALYSIS  60 mcg Intravenous Q Mon-HD  . diltiazem  120 mg Oral QHS  . famotidine  20 mg Oral QHS  . feeding supplement (ENSURE ENLIVE)  237 mL Oral TID BM  . gabapentin  100 mg Oral Once per day on Sun Tue Thu Sat  . gabapentin  100 mg Oral Once per day on Sun Tue Thu Sat  . gabapentin  300 mg Oral Once per day on Mon Wed Fri  . insulin aspart  0-9 Units Subcutaneous TID WC  . insulin detemir  8 Units Subcutaneous QHS  . lidocaine  1 patch Transdermal Q24H  . multivitamin  1 tablet Oral Daily  . omega-3 acid ethyl esters  1,000 mg Oral Daily  . PARoxetine  40 mg Oral QHS  . pravastatin  20 mg Oral QPM  . senna-docusate  2 tablet Oral BID  . warfarin  2.5 mg Oral q1800  . Warfarin - Pharmacist Dosing Inpatient   Does not apply q1800   No results found. BMET    Component Value Date/Time   NA 132 (L) 12/09/2016 2205   NA 137 07/04/2014 1303   K 4.9 12/09/2016 2205   K 3.3 (L) 07/04/2014 1303   CL 94 (L) 12/09/2016 2205   CL 100 07/04/2014 1303   CO2 26 12/09/2016 2205   CO2 29 07/04/2014 1303   GLUCOSE 150 (H) 12/09/2016 2205   GLUCOSE 76 07/04/2014 1303   BUN 47 (H) 12/09/2016 2205   BUN 44 (H) 07/04/2014 1303   CREATININE 4.84 (H) 12/09/2016 2205   CREATININE 2.72 (H)  07/04/2014 1303   CALCIUM 9.5 12/09/2016 2205   CALCIUM 8.9 07/04/2014 1303   GFRNONAA 8 (L) 12/09/2016 2205   GFRNONAA 18 (L) 07/04/2014 1303   GFRNONAA 29 (L) 12/02/2013 0847   GFRAA 9 (L) 12/09/2016 2205   GFRAA 22 (L) 07/04/2014 1303   GFRAA 34 (L) 12/02/2013 0847   CBC    Component Value Date/Time   WBC 9.0 12/09/2016 2205   RBC 3.30 (L) 12/09/2016 2205   HGB 9.7 (L) 12/09/2016 2205   HGB 14.2 12/03/2013 0820   HCT 30.6 (L) 12/09/2016 2205   HCT 42.7 12/03/2013 0820   PLT 226 12/09/2016 2205   PLT 214 12/03/2013 0820   MCV 92.7 12/09/2016 2205   MCV 94 12/03/2013 0820   MCH 29.4 12/09/2016 2205   MCHC 31.7 12/09/2016  2205   RDW 17.5 (H) 12/09/2016 2205   RDW 17.1 (H) 12/03/2013 0820   LYMPHSABS 0.8 (L) 08/09/2016 2011   LYMPHSABS 1.1 12/03/2013 0820   MONOABS 0.7 08/09/2016 2011   MONOABS 0.9 12/03/2013 0820   EOSABS 0.2 08/09/2016 2011   EOSABS 0.2 12/03/2013 0820   BASOSABS 0.1 08/09/2016 2011   BASOSABS 0.1 12/03/2013 0820     Assessment:  1. ESRD On HD [facility] MWF 2. Osteomyelitis/disckitis L2-3 on Daptomycin 3. HTN 4. Chronic A fib on coumadin 5. DM 6. Anemia on Aranesp    Plan: 1. HD tomorrow if still here but SW says issues with daptomycin at her HD unit have been solved and anticipate DC today 2. PO4 on low side, will hold Ca acetate for now [name]

## 2017-01-30 DEATH — deceased

## 2017-04-08 ENCOUNTER — Encounter (INDEPENDENT_AMBULATORY_CARE_PROVIDER_SITE_OTHER): Payer: Medicare Other

## 2017-04-08 ENCOUNTER — Ambulatory Visit (INDEPENDENT_AMBULATORY_CARE_PROVIDER_SITE_OTHER): Payer: Medicare Other | Admitting: Vascular Surgery

## 2017-04-13 IMAGING — CT CT ABD-PELV W/O CM
2 of 4 series · 15 of 46 positions shown, 17 images · non-contrast
Comparison: 10/12/2015.  09/14/2015.

CLINICAL DATA: Lower abdominal pain for 1 week, increasing today.
Nausea. Allergy to intravenous contrast material. Oral contrast with
barium.

EXAM:
CT ABDOMEN AND PELVIS WITHOUT CONTRAST
TECHNIQUE: Multidetector CT imaging of the abdomen and pelvis was performed
following the standard protocol without IV contrast.

[Series 2: routine abd/pel wo · axial · 0.85mm/px · z∈[-706,-316]mm · 12 of 90 slices shown, 14 images]
[im 8/90  soft-tissue]
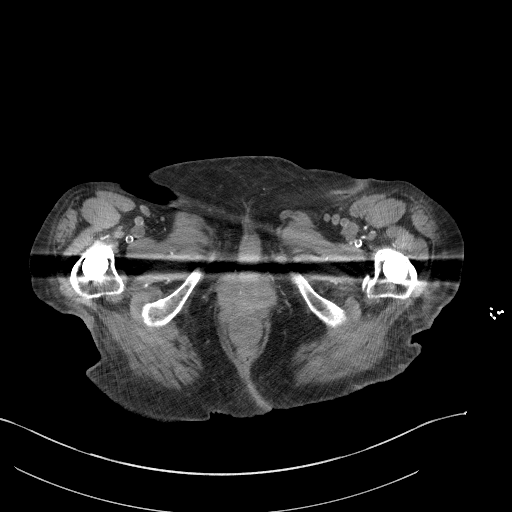
[im 8/90  bone]
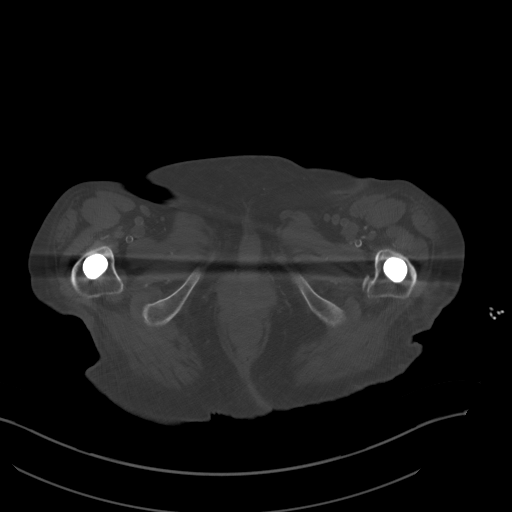
[im 15/90  soft-tissue]
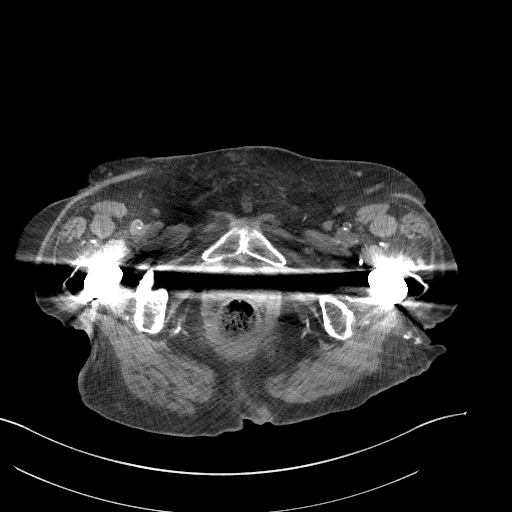
[im 22/90  soft-tissue]
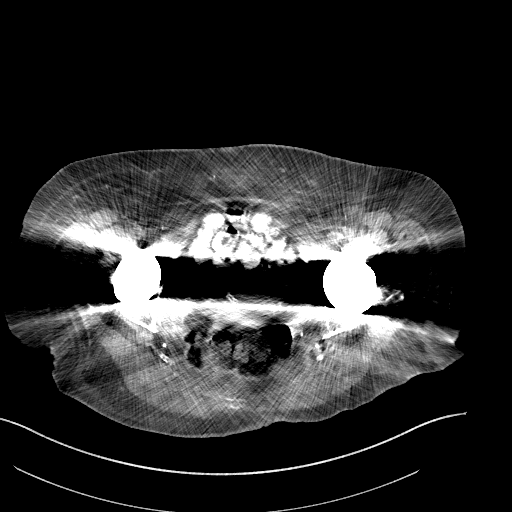
[im 29/90  soft-tissue]
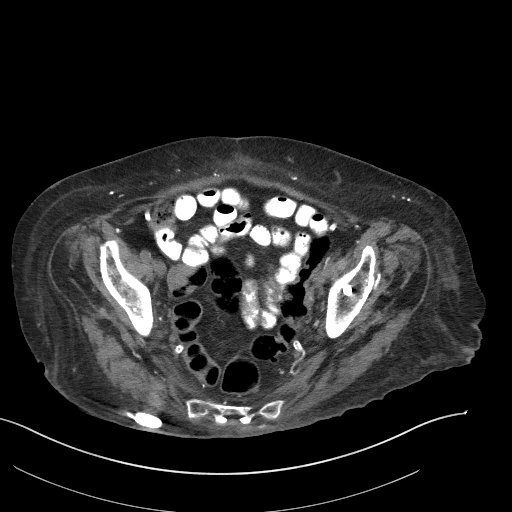
[im 36/90  soft-tissue]
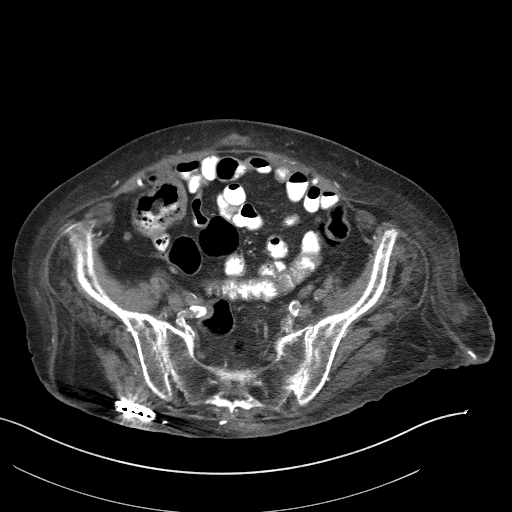
[im 43/90  soft-tissue]
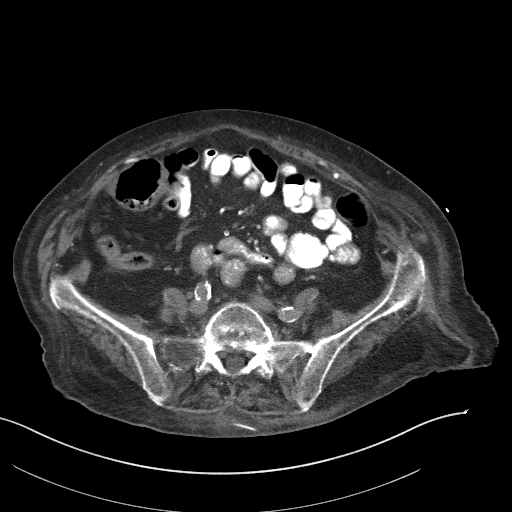
[im 50/90  soft-tissue]
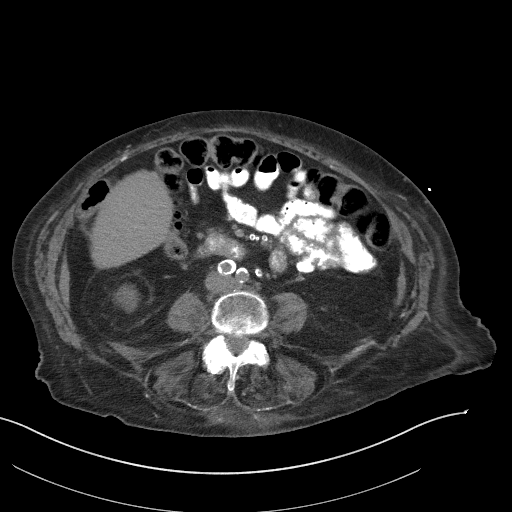
[im 57/90  soft-tissue]
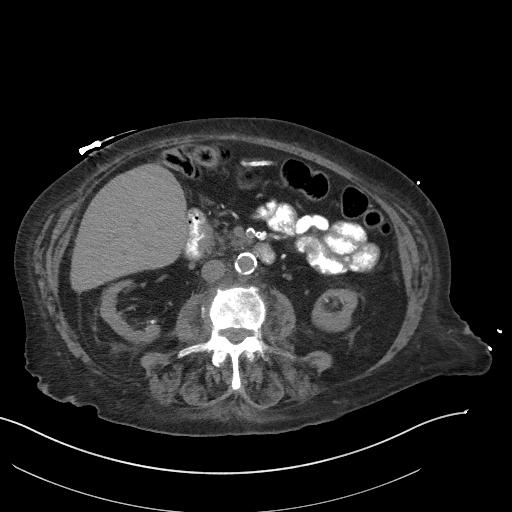
[im 65/90  soft-tissue]
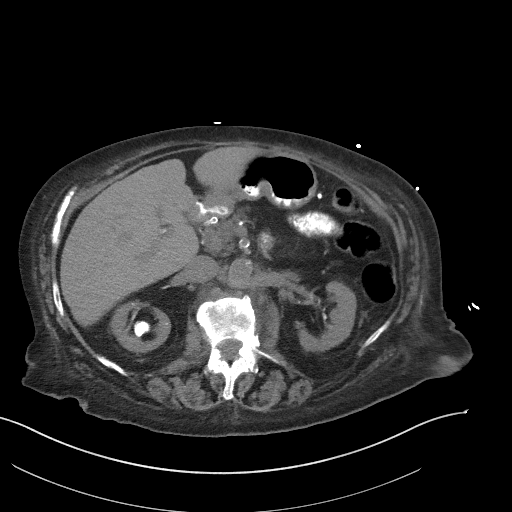
[im 65/90  bone]
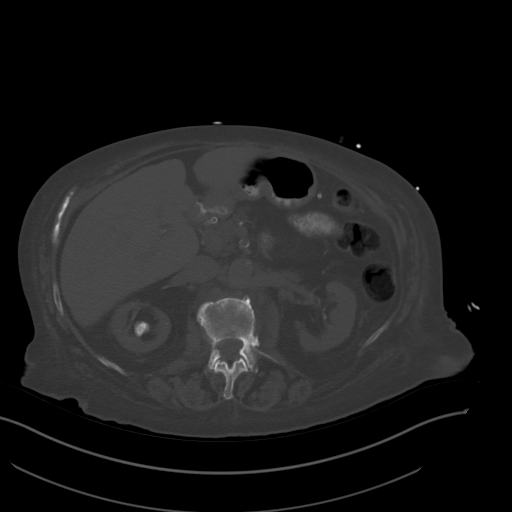
[im 72/90  soft-tissue]
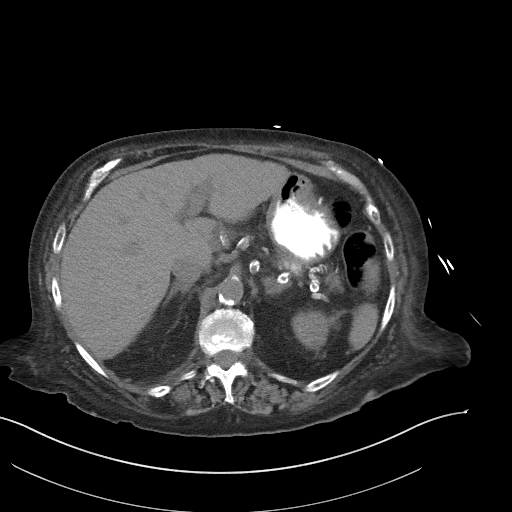
[im 79/90  soft-tissue]
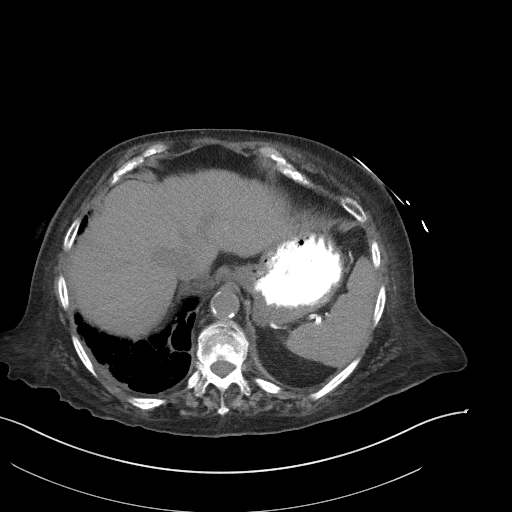
[im 86/90  soft-tissue]
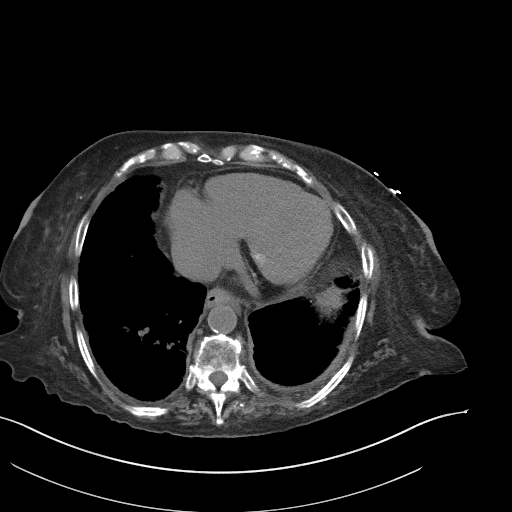

[Series 5: coronal st · coronal · 0.78mm/px · 3 of 91 slices shown]
[im 31/91  soft-tissue]
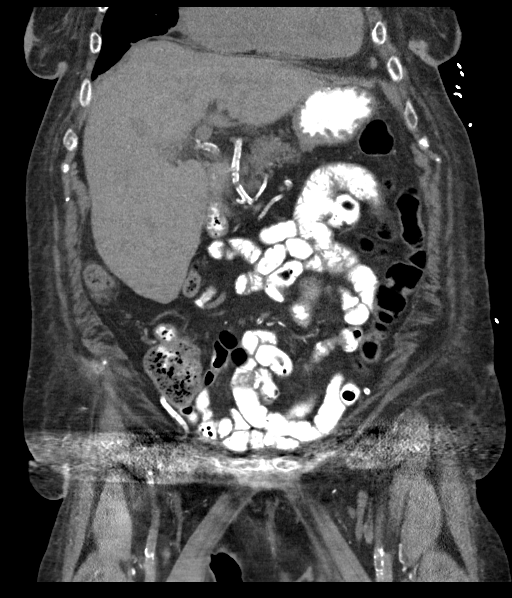
[im 41/91  soft-tissue]
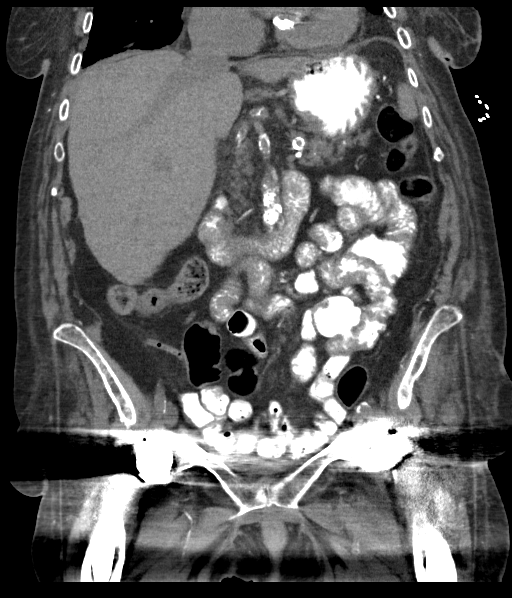
[im 51/91  soft-tissue]
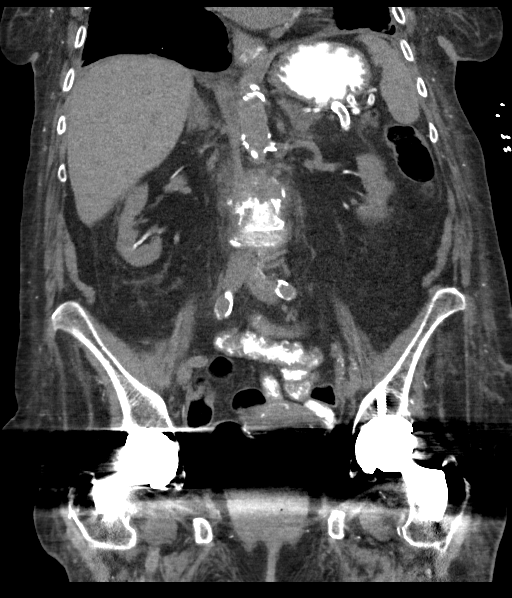

[15 of 46 positions shown; findings below may reference images not displayed]

FINDINGS: Lower chest: Minimal bilateral pleural effusions. Atelectasis and
scarring in the lung bases. Calcified granulomas in both lung bases.
Noncalcified nodule in the right lung base measures 9 mm diameter.
No significant change since prior study. Calcification of the mitral
valve annulus. Cardiac enlargement. Small esophageal hiatal hernia.

Hepatobiliary: Suggestion of hepatic cirrhosis with nodular contour
to the liver. No focal lesions are demonstrated on noncontrast
imaging. Gallbladder is surgically absent. No bile duct dilatation.

Pancreas: Fatty infiltration of the pancreas. No pancreatic ductal
dilatation or infiltration.

Spleen: Spleen size is normal.  No focal lesions.

Adrenals/Urinary Tract: No adrenal gland nodules. Bilateral renal
parenchymal atrophy with vascular calcifications along the renal
arteries and arterials. Large calcification in the upper pole right
kidney measuring 12 mm. No change since prior study. No
hydronephrosis or hydroureter. No ureteral stones identified.
Evaluation of the bladder is severely limited due to streak artifact
from bilateral hip arthroplasties.

Stomach/Bowel: Stomach, small bowel, and colon are not abnormally
distended. No discrete wall thickening is appreciated. Scattered
stool in the colon. Appendix is normal.

Vascular/Lymphatic: Extensive vascular calcifications throughout the
abdomen and pelvis involving all major arterial structures,
including the abdominal aorta and branch vessels. No aortic
aneurysm. IVC is normal in caliber. No significant lymphadenopathy.

Reproductive: Uterus and bilateral adnexa are unremarkable.

Other: Evaluation of pelvic organs is severely limited by streak
artifact from bilateral hip arthroplasties. No free fluid or free
air in the abdomen. Ventral abdominal wall hernia containing fat and
measuring about 2.7 cm maximal diameter. This is new since prior
study. No bowel herniation. Generator pack for stimulator device in
the soft tissues posterior to the right pelvis.

Musculoskeletal: There is increasing bone destruction and sclerosis
of the endplates adjacent to the L2-3 interspace level. There is
apparent bone destruction of the endplates. There is soft tissue
swelling of the adjacent paraspinal muscles and psoas muscles,
increased since prior study. This is suspicious for discitis/
osteomyelitis and surrounding inflammatory reaction. No definite
loculated fluid collection identified. Diffuse degenerative change
throughout the lumbar spine.
IMPRESSION: 1. Described changes at L2-3 interspace suspicious for discitis/
osteomyelitis and inflammatory changes in the adjacent paraspinal
and psoas muscles.
2. Small bilateral pleural effusions with basilar atelectasis. Small
esophageal hiatal hernia. 9 mm nodule in the right lung base is
unchanged since prior study.
3. Large nonobstructing stone in the superior pole right kidney.
4. Extensive aortic atherosclerosis and calcification throughout the
abdominal vasculature.
5. New ventral abdominal wall hernia containing fat.
These results were called by telephone at the time of interpretation
on 08/09/2016 at [DATE] to Dr. DAVID PARKIS PADRIK , who verbally
acknowledged these results.
